# Patient Record
Sex: Male | Born: 1965 | Race: White | Hispanic: No | Smoking: Current every day smoker
Health system: Southern US, Community
[De-identification: ages and names within clinical notes are randomized; demographics above are authoritative.]

## PROBLEM LIST (undated history)

## (undated) DIAGNOSIS — K589 Irritable bowel syndrome without diarrhea: Secondary | ICD-10-CM

## (undated) DIAGNOSIS — R1013 Epigastric pain: Secondary | ICD-10-CM

## (undated) DIAGNOSIS — E785 Hyperlipidemia, unspecified: Secondary | ICD-10-CM

## (undated) DIAGNOSIS — I10 Essential (primary) hypertension: Secondary | ICD-10-CM

## (undated) DIAGNOSIS — R569 Unspecified convulsions: Secondary | ICD-10-CM

## (undated) DIAGNOSIS — F10939 Alcohol use, unspecified with withdrawal, unspecified: Principal | ICD-10-CM

## (undated) DIAGNOSIS — E876 Hypokalemia: Secondary | ICD-10-CM

## (undated) DIAGNOSIS — F10239 Alcohol dependence with withdrawal, unspecified: Principal | ICD-10-CM

## (undated) DIAGNOSIS — R0602 Shortness of breath: Secondary | ICD-10-CM

## (undated) DIAGNOSIS — G629 Polyneuropathy, unspecified: Secondary | ICD-10-CM

## (undated) DIAGNOSIS — F419 Anxiety disorder, unspecified: Secondary | ICD-10-CM

## (undated) DIAGNOSIS — F19239 Other psychoactive substance dependence with withdrawal, unspecified: Secondary | ICD-10-CM

## (undated) DIAGNOSIS — I4581 Long QT syndrome: Secondary | ICD-10-CM

## (undated) DIAGNOSIS — K759 Inflammatory liver disease, unspecified: Secondary | ICD-10-CM

## (undated) DIAGNOSIS — N400 Enlarged prostate without lower urinary tract symptoms: Secondary | ICD-10-CM

## (undated) DIAGNOSIS — F1910 Other psychoactive substance abuse, uncomplicated: Secondary | ICD-10-CM

## (undated) DIAGNOSIS — F99 Mental disorder, not otherwise specified: Secondary | ICD-10-CM

## (undated) DIAGNOSIS — J449 Chronic obstructive pulmonary disease, unspecified: Secondary | ICD-10-CM

## (undated) DIAGNOSIS — I499 Cardiac arrhythmia, unspecified: Secondary | ICD-10-CM

## (undated) DIAGNOSIS — T7840XA Allergy, unspecified, initial encounter: Secondary | ICD-10-CM

## (undated) DIAGNOSIS — F1010 Alcohol abuse, uncomplicated: Secondary | ICD-10-CM

## (undated) DIAGNOSIS — K219 Gastro-esophageal reflux disease without esophagitis: Secondary | ICD-10-CM

## (undated) DIAGNOSIS — R51 Headache: Secondary | ICD-10-CM

## (undated) DIAGNOSIS — G709 Myoneural disorder, unspecified: Secondary | ICD-10-CM

## (undated) HISTORY — PX: SHOULDER SURGERY: SHX246

## (undated) HISTORY — DX: Benign prostatic hyperplasia without lower urinary tract symptoms: N40.0

## (undated) HISTORY — PX: BACK SURGERY: SHX140

## (undated) HISTORY — PX: NISSEN FUNDOPLICATION: SHX2091

## (undated) HISTORY — DX: Essential (primary) hypertension: I10

## (undated) HISTORY — DX: Irritable bowel syndrome, unspecified: K58.9

## (undated) HISTORY — DX: Epigastric pain: R10.13

## (undated) HISTORY — DX: Hyperlipidemia, unspecified: E78.5

## (undated) HISTORY — PX: NASAL SINUS SURGERY: SHX719

## (undated) HISTORY — DX: Hypokalemia: E87.6

## (undated) HISTORY — DX: Myoneural disorder, unspecified: G70.9

## (undated) HISTORY — DX: Allergy, unspecified, initial encounter: T78.40XA

## (undated) HISTORY — PX: UPPER GASTROINTESTINAL ENDOSCOPY: SHX188

---

## 1996-03-11 HISTORY — PX: COLONOSCOPY: SHX174

## 1997-10-21 ENCOUNTER — Ambulatory Visit (HOSPITAL_BASED_OUTPATIENT_CLINIC_OR_DEPARTMENT_OTHER): Admission: RE | Admit: 1997-10-21 | Discharge: 1997-10-21 | Payer: Self-pay | Admitting: *Deleted

## 1998-03-11 HISTORY — PX: NECK SURGERY: SHX720

## 1998-10-18 ENCOUNTER — Encounter: Payer: Self-pay | Admitting: Neurosurgery

## 1998-10-18 ENCOUNTER — Ambulatory Visit (HOSPITAL_COMMUNITY): Admission: RE | Admit: 1998-10-18 | Discharge: 1998-10-18 | Payer: Self-pay | Admitting: Neurosurgery

## 1999-01-11 ENCOUNTER — Ambulatory Visit (HOSPITAL_COMMUNITY): Admission: RE | Admit: 1999-01-11 | Discharge: 1999-01-11 | Payer: Self-pay | Admitting: Neurosurgery

## 1999-01-11 ENCOUNTER — Encounter: Payer: Self-pay | Admitting: Neurosurgery

## 1999-02-09 ENCOUNTER — Encounter: Payer: Self-pay | Admitting: Neurosurgery

## 1999-02-13 ENCOUNTER — Encounter: Payer: Self-pay | Admitting: Neurosurgery

## 1999-02-13 ENCOUNTER — Inpatient Hospital Stay (HOSPITAL_COMMUNITY): Admission: RE | Admit: 1999-02-13 | Discharge: 1999-02-16 | Payer: Self-pay | Admitting: Neurosurgery

## 1999-02-26 ENCOUNTER — Encounter: Payer: Self-pay | Admitting: Neurosurgery

## 1999-02-26 ENCOUNTER — Encounter: Admission: RE | Admit: 1999-02-26 | Discharge: 1999-02-26 | Payer: Self-pay | Admitting: Neurosurgery

## 1999-05-20 ENCOUNTER — Encounter: Payer: Self-pay | Admitting: Neurosurgery

## 1999-05-20 ENCOUNTER — Ambulatory Visit (HOSPITAL_COMMUNITY): Admission: RE | Admit: 1999-05-20 | Discharge: 1999-05-20 | Payer: Self-pay | Admitting: Neurosurgery

## 1999-06-19 ENCOUNTER — Inpatient Hospital Stay (HOSPITAL_COMMUNITY): Admission: RE | Admit: 1999-06-19 | Discharge: 1999-06-20 | Payer: Self-pay | Admitting: Neurosurgery

## 1999-06-19 ENCOUNTER — Encounter: Payer: Self-pay | Admitting: Neurosurgery

## 1999-07-03 ENCOUNTER — Encounter: Payer: Self-pay | Admitting: Neurosurgery

## 1999-07-03 ENCOUNTER — Encounter: Admission: RE | Admit: 1999-07-03 | Discharge: 1999-07-03 | Payer: Self-pay | Admitting: Neurosurgery

## 2000-09-26 ENCOUNTER — Encounter: Admission: RE | Admit: 2000-09-26 | Discharge: 2000-09-26 | Payer: Self-pay | Admitting: Neurosurgery

## 2000-09-26 ENCOUNTER — Encounter: Payer: Self-pay | Admitting: Neurosurgery

## 2000-12-25 ENCOUNTER — Encounter: Payer: Self-pay | Admitting: Neurosurgery

## 2000-12-25 ENCOUNTER — Ambulatory Visit (HOSPITAL_COMMUNITY): Admission: RE | Admit: 2000-12-25 | Discharge: 2000-12-25 | Payer: Self-pay | Admitting: Neurosurgery

## 2001-02-21 ENCOUNTER — Encounter: Payer: Self-pay | Admitting: Neurosurgery

## 2001-02-21 ENCOUNTER — Ambulatory Visit (HOSPITAL_COMMUNITY): Admission: RE | Admit: 2001-02-21 | Discharge: 2001-02-21 | Payer: Self-pay | Admitting: Neurosurgery

## 2001-04-03 ENCOUNTER — Encounter: Payer: Self-pay | Admitting: Neurosurgery

## 2001-04-03 ENCOUNTER — Encounter: Admission: RE | Admit: 2001-04-03 | Discharge: 2001-04-03 | Payer: Self-pay | Admitting: Neurosurgery

## 2001-05-01 ENCOUNTER — Encounter: Payer: Self-pay | Admitting: Neurosurgery

## 2001-05-01 ENCOUNTER — Encounter: Admission: RE | Admit: 2001-05-01 | Discharge: 2001-05-01 | Payer: Self-pay | Admitting: Neurosurgery

## 2001-05-15 ENCOUNTER — Encounter: Admission: RE | Admit: 2001-05-15 | Discharge: 2001-05-15 | Payer: Self-pay | Admitting: Neurosurgery

## 2001-05-15 ENCOUNTER — Encounter: Payer: Self-pay | Admitting: Neurosurgery

## 2001-06-18 ENCOUNTER — Encounter: Payer: Self-pay | Admitting: Gastroenterology

## 2001-06-18 ENCOUNTER — Encounter: Admission: RE | Admit: 2001-06-18 | Discharge: 2001-06-18 | Payer: Self-pay | Admitting: Gastroenterology

## 2001-06-19 ENCOUNTER — Encounter: Admission: RE | Admit: 2001-06-19 | Discharge: 2001-06-19 | Payer: Self-pay | Admitting: Gastroenterology

## 2001-06-19 ENCOUNTER — Encounter: Payer: Self-pay | Admitting: Gastroenterology

## 2001-07-01 ENCOUNTER — Encounter: Payer: Self-pay | Admitting: Gastroenterology

## 2001-07-01 ENCOUNTER — Ambulatory Visit (HOSPITAL_COMMUNITY): Admission: RE | Admit: 2001-07-01 | Discharge: 2001-07-01 | Payer: Self-pay | Admitting: Gastroenterology

## 2001-08-18 ENCOUNTER — Ambulatory Visit (HOSPITAL_COMMUNITY): Admission: RE | Admit: 2001-08-18 | Discharge: 2001-08-18 | Payer: Self-pay | Admitting: Gastroenterology

## 2001-09-29 ENCOUNTER — Ambulatory Visit (HOSPITAL_COMMUNITY): Admission: RE | Admit: 2001-09-29 | Discharge: 2001-09-29 | Payer: Self-pay | Admitting: Surgery

## 2001-09-29 ENCOUNTER — Encounter: Payer: Self-pay | Admitting: Surgery

## 2001-10-20 ENCOUNTER — Encounter: Payer: Self-pay | Admitting: Surgery

## 2001-10-26 ENCOUNTER — Inpatient Hospital Stay (HOSPITAL_COMMUNITY): Admission: RE | Admit: 2001-10-26 | Discharge: 2001-10-28 | Payer: Self-pay | Admitting: Surgery

## 2005-01-18 ENCOUNTER — Ambulatory Visit (HOSPITAL_COMMUNITY): Admission: RE | Admit: 2005-01-18 | Discharge: 2005-01-18 | Payer: Self-pay | Admitting: Surgery

## 2005-01-23 ENCOUNTER — Ambulatory Visit (HOSPITAL_COMMUNITY): Admission: RE | Admit: 2005-01-23 | Discharge: 2005-01-23 | Payer: Self-pay | Admitting: Surgery

## 2007-04-27 ENCOUNTER — Encounter: Admission: RE | Admit: 2007-04-27 | Discharge: 2007-04-27 | Payer: Self-pay | Admitting: Family Medicine

## 2010-03-31 ENCOUNTER — Encounter: Payer: Self-pay | Admitting: Surgery

## 2010-06-19 ENCOUNTER — Ambulatory Visit (INDEPENDENT_AMBULATORY_CARE_PROVIDER_SITE_OTHER)
Admission: RE | Admit: 2010-06-19 | Discharge: 2010-06-19 | Disposition: A | Discharge status: Home / Self Care | Payer: Managed Care, Other (non HMO) | Source: Ambulatory Visit | Attending: Internal Medicine | Admitting: Internal Medicine

## 2010-06-19 ENCOUNTER — Encounter: Payer: Self-pay | Admitting: Internal Medicine

## 2010-06-19 ENCOUNTER — Institutional Professional Consult (permissible substitution): Payer: Self-pay | Admitting: Internal Medicine

## 2010-06-19 ENCOUNTER — Ambulatory Visit (INDEPENDENT_AMBULATORY_CARE_PROVIDER_SITE_OTHER): Payer: Managed Care, Other (non HMO) | Admitting: Internal Medicine

## 2010-06-19 DIAGNOSIS — I10 Essential (primary) hypertension: Secondary | ICD-10-CM

## 2010-06-19 DIAGNOSIS — R0602 Shortness of breath: Secondary | ICD-10-CM

## 2010-06-19 DIAGNOSIS — E785 Hyperlipidemia, unspecified: Secondary | ICD-10-CM

## 2010-06-19 DIAGNOSIS — R079 Chest pain, unspecified: Secondary | ICD-10-CM

## 2010-06-19 DIAGNOSIS — Z77120 Contact with and (suspected) exposure to mold (toxic): Secondary | ICD-10-CM | POA: Insufficient documentation

## 2010-06-19 DIAGNOSIS — R0789 Other chest pain: Secondary | ICD-10-CM | POA: Insufficient documentation

## 2010-06-19 MED ORDER — TRAMADOL HCL 50 MG PO TABS: ORAL_TABLET | ORAL | Status: DC

## 2010-06-19 MED ORDER — FAMOTIDINE 20 MG PO TABS: ORAL_TABLET | ORAL | Status: DC

## 2010-06-19 MED ORDER — ESOMEPRAZOLE MAGNESIUM 40 MG PO CPDR: DELAYED_RELEASE_CAPSULE | ORAL | Status: DC

## 2010-06-19 NOTE — Assessment & Plan Note (Signed)
The differential diagnosis of difficult to control airways disorders is extensive with no quick and easy answers but easy to remember because it consists of 12 A's,  Two Bs and one C: 1. Adherence, always a challenge and the leading suspect but I don't believe is an issue here  2. Acid reflux disease, with the greater proportion of pulmonary patients with no overt heartburn symptoms, and no easy way to treat non-acid reflux > max gerd rx and diet reviewed. 3. Ace inhibitor use, the side effects of which give  even experienced pulmonologists a challenge sorting out (it's not all about the dry cough, though if cough is present ace's will need to be stopped for at least a month to tease out this component) Last dose 07/01/10 4. Active sinus dz, best addressed by a sinus ct 5. Active smoking,  Discussed commit to quit 6. Allergic diseases > Clearly needs to avoid mold 7. Aspiration, a perennial problem in the elderly or other patients at risk 8. Allergic Bronchopulmonary Aspergillosis, associated with IgE's in the thousands 9. Alpha one Antitrypsin deficiency, a must screen in patients with chronic airflow obstruction syndromes out of proportion to smoking history. 10. Adverse effect of inhalers, especially DPI's and especially with poor inhaler technique 11 Anxiety, always a diagnosis of exclusion 12 Acquaintance of or Actual healthcare provider ( which can be very challenging) Mother is a nurse Two B's Bronchiectasis and Beta blocker effects >not relevant here  One C Congestive heart failure, nicely ruled out now with BNP level of < 100 when symptomatic  >not relevant here   For now rx max gerd, keep off ace and suppress urge to clear throat to see if it will clear.

## 2010-06-19 NOTE — Progress Notes (Signed)
  Subjective:    Patient ID: Mark [name], male    DOB: Mar 17, 1965, 45 y.o.   MRN: [id number]  HPI  50 yowm smoker with no previous hx of resp complaints referred by Dr Foy Guadalajara for eval of new onset cp/cough/sob p ? Mold exp on job 05/02/10    06/19/2010 cc chest tightness started same day (Feb 22nd 2012) as exposed to pipes in old appt cleaning up after replaced a valve and smelled moldy and dusty environment while wearing dust mask then dev severe cough to point of heaving s/p NF assoc with voice loss, sob while on lisnopril and stopped working at that point and then still  ended up at [facility] 2 days later ER 2/25 with nl ct chest  dx with hbp and rec albuterol but no change in lisinopril which eventually was stopped by Dr Foy Guadalajara on 05/31/10 but symptoms worse since Friday 6th of March with tremors and tight throat, dysphagia, sob despite nexium started 3/5. Also rx with sporanox   Cough presently non-stop dry sore throat with constant urge for clearing 24 h per day. No active sinus c/os.  Pt denies any significant   itching, sneezing,  nasal congestion or excess/ purulent secretions,  fever, chills, sweats, unintended wt loss, pleuritic or exertional cp, hempoptysis, orthopnea pnd or leg swelling.    Also denies any obvious fluctuation of symptoms with weather or environmental changes or other aggravating or alleviating factors.       PMHx S/p NF  2003  Smoker       Review of Systems  Constitutional: Positive for activity change and unexpected weight change. Negative for fever, chills and appetite change.  HENT: Positive for congestion, sore throat and trouble swallowing. Negative for rhinorrhea, sneezing, dental problem, voice change and postnasal drip.   Eyes: Negative for visual disturbance.  Respiratory: Positive for cough and shortness of breath. Negative for choking.   Cardiovascular: Positive for chest pain. Negative for leg swelling.  Gastrointestinal: Positive for nausea and  abdominal pain. Negative for vomiting.  Genitourinary: Negative for difficulty urinating.  Musculoskeletal: Positive for arthralgias.  Skin: Negative for rash.  Psychiatric/Behavioral: Negative for behavioral problems and confusion.       Objective:   Physical Exam Anxious wm nad   Wt = 205 lb freq thoat clearing and minimal pseudowheeze  HEENT: nl dentition, turbinates, and orophanx. Nl external ear canals without cough reflex   NECK :  without JVD/Nodes/TM/ nl carotid upstrokes bilaterally   LUNGS: no acc muscle use, clear to A and P bilaterally without cough on insp or exp maneuvers   CV:  RRR  no s3 or murmur or increase in P2, no edema   ABD:  soft and nontender with nl excursion in the supine position. No bruits or organomegaly, bowel sounds nl  MS:  warm without deformities, calf tenderness, cyanosis or clubbing  SKIN: warm and dry without lesions    NEURO:  alert, approp, no deficits        cxr 05/17/2010 ok Assessment & Plan:

## 2010-06-19 NOTE — Patient Instructions (Signed)
Increase Nexium to Take 30- 60 min before your first and last meals of the day and Pepcid 20 mg one at bedtime until no cough at all  GERD (REFLUX)  is an extremely common cause of respiratory symptoms, many times with no significant heartburn at all.    It can be treated with medication, but also with lifestyle changes including avoidance of late meals, excessive alcohol, smoking cessation, and avoid fatty foods, chocolate, peppermint, colas, red wine, and acidic juices such as orange juice.  NO MINT OR MENTHOL PRODUCTS SO NO COUGH DROPS  USE SUGARLESS CANDY INSTEAD (jolley ranchers or Stover's)  NO OIL BASED VITAMINS   Take delsym two tsp every 12 hours and supplement if needed with  tramadol 50 mg up to 2 every 4 hours to suppress the urge to cough. Swallowing water or using ice chips/non mint and menthol containing candies (such as lifesavers or sugarless jolly ranchers) are also effective.  You should rest your voice and avoid activities that you know make you cough.  Once you have eliminated the cough for 3 straight days try reducing the tramadol first,  then the delsym as tolerated.    You will not be completely free from the effects of the lisinopril until around the first of May but need to return here if not 100% by then

## 2010-06-25 ENCOUNTER — Inpatient Hospital Stay (HOSPITAL_COMMUNITY)
Admission: AD | Admit: 2010-06-25 | Discharge: 2010-06-30 | DRG: 897 | Disposition: A | Discharge status: Home / Self Care | Payer: Managed Care, Other (non HMO) | Source: Ambulatory Visit | Attending: Internal Medicine | Admitting: Internal Medicine

## 2010-06-25 ENCOUNTER — Telehealth: Payer: Self-pay | Admitting: Internal Medicine

## 2010-06-25 DIAGNOSIS — R05 Cough: Secondary | ICD-10-CM | POA: Diagnosis present

## 2010-06-25 DIAGNOSIS — M545 Low back pain, unspecified: Secondary | ICD-10-CM | POA: Diagnosis present

## 2010-06-25 DIAGNOSIS — I10 Essential (primary) hypertension: Secondary | ICD-10-CM | POA: Diagnosis present

## 2010-06-25 DIAGNOSIS — K219 Gastro-esophageal reflux disease without esophagitis: Secondary | ICD-10-CM | POA: Diagnosis present

## 2010-06-25 DIAGNOSIS — F10239 Alcohol dependence with withdrawal, unspecified: Principal | ICD-10-CM | POA: Diagnosis present

## 2010-06-25 DIAGNOSIS — F1020 Alcohol dependence, uncomplicated: Secondary | ICD-10-CM | POA: Diagnosis present

## 2010-06-25 DIAGNOSIS — R000 Tachycardia, unspecified: Secondary | ICD-10-CM | POA: Diagnosis present

## 2010-06-25 DIAGNOSIS — G252 Other specified forms of tremor: Secondary | ICD-10-CM | POA: Diagnosis present

## 2010-06-25 DIAGNOSIS — F411 Generalized anxiety disorder: Secondary | ICD-10-CM | POA: Diagnosis not present

## 2010-06-25 DIAGNOSIS — R059 Cough, unspecified: Secondary | ICD-10-CM | POA: Diagnosis present

## 2010-06-25 DIAGNOSIS — F10939 Alcohol use, unspecified with withdrawal, unspecified: Principal | ICD-10-CM | POA: Diagnosis present

## 2010-06-25 DIAGNOSIS — Z77120 Contact with and (suspected) exposure to mold (toxic): Secondary | ICD-10-CM | POA: Diagnosis present

## 2010-06-25 DIAGNOSIS — G250 Essential tremor: Secondary | ICD-10-CM | POA: Diagnosis present

## 2010-06-25 DIAGNOSIS — F17200 Nicotine dependence, unspecified, uncomplicated: Secondary | ICD-10-CM | POA: Diagnosis present

## 2010-06-25 DIAGNOSIS — E785 Hyperlipidemia, unspecified: Secondary | ICD-10-CM | POA: Diagnosis present

## 2010-06-25 DIAGNOSIS — G8929 Other chronic pain: Secondary | ICD-10-CM | POA: Diagnosis present

## 2010-06-25 DIAGNOSIS — M542 Cervicalgia: Secondary | ICD-10-CM | POA: Diagnosis present

## 2010-06-25 LAB — CBC
HCT: 42.3 % (ref 39.0–52.0)
Hemoglobin: 15 g/dL (ref 13.0–17.0)
WBC: 6.9 10*3/uL (ref 4.0–10.5)

## 2010-06-25 LAB — COMPREHENSIVE METABOLIC PANEL
ALT: 48 U/L (ref 0–53)
AST: 82 U/L — ABNORMAL HIGH (ref 0–37)
CO2: 27 mEq/L (ref 19–32)
Chloride: 98 mEq/L (ref 96–112)
GFR calc Af Amer: >60 mL/min (ref >60)
GFR calc non Af Amer: >60 mL/min (ref >60)
Sodium: 139 mEq/L (ref 135–145)
Total Bilirubin: 0.3 mg/dL (ref 0.3–1.2)

## 2010-06-25 LAB — DIFFERENTIAL
Basophils Absolute: 0 10*3/uL (ref 0.0–0.1)
Lymphocytes Relative: 20 % (ref 12–46)
Monocytes Absolute: 0.4 10*3/uL (ref 0.1–1.0)
Neutro Abs: 5 10*3/uL (ref 1.7–7.7)

## 2010-06-25 NOTE — Telephone Encounter (Signed)
Will forward to MW so he is aware. Please advise thanks

## 2010-06-26 ENCOUNTER — Ambulatory Visit: Payer: Managed Care, Other (non HMO) | Admitting: Internal Medicine

## 2010-06-26 DIAGNOSIS — R05 Cough: Secondary | ICD-10-CM

## 2010-06-26 DIAGNOSIS — R0602 Shortness of breath: Secondary | ICD-10-CM

## 2010-06-26 LAB — RAPID URINE DRUG SCREEN, HOSP PERFORMED
Amphetamines: NOT DETECTED
Barbiturates: NOT DETECTED
Benzodiazepines: POSITIVE — AB
Cocaine: NOT DETECTED
Opiates: NOT DETECTED
Tetrahydrocannabinol: NOT DETECTED

## 2010-06-26 NOTE — Telephone Encounter (Signed)
Aware, see consult note

## 2010-06-27 LAB — MAGNESIUM: Magnesium: 2.2 mg/dL (ref 1.5–2.5)

## 2010-06-28 ENCOUNTER — Telehealth: Payer: Self-pay | Admitting: Internal Medicine

## 2010-06-28 LAB — FOLATE RBC: RBC Folate: 431 ng/mL (ref >=366)

## 2010-06-28 LAB — RPR: RPR Ser Ql: NONREACTIVE

## 2010-06-28 LAB — VITAMIN B12: Vitamin B-12: 615 pg/mL (ref 211–911)

## 2010-06-28 LAB — COMPREHENSIVE METABOLIC PANEL
Albumin: 3.6 g/dL (ref 3.5–5.2)
BUN: 6 mg/dL (ref 6–23)
Chloride: 101 mEq/L (ref 96–112)
Creatinine, Ser: 0.96 mg/dL (ref 0.4–1.5)
Total Bilirubin: 0.7 mg/dL (ref 0.3–1.2)
Total Protein: 6.5 g/dL (ref 6.0–8.3)

## 2010-06-28 NOTE — Telephone Encounter (Signed)
Pt's mother is requesting to speak with MW regarding "mold issue". She states that she has been told thee is a blood test for this and wants to discuss having this test done on her son. She says the hospital has told her this would have to be ordered by Dr. Sherene Sires. She feels the nurses and doctors now caring for her son are not answering the questions she has and they are wanting to release him possibly on Fri., 4/20. Pls advise.

## 2010-06-28 NOTE — Telephone Encounter (Signed)
Pt's mother aware but would still like to speak with MW directly and has several questions. She also stated that she was told to ask for a blood test to be ran that is called BGFE. Mother can be reached at [phone].

## 2010-06-28 NOTE — Telephone Encounter (Signed)
Discussed - no further w/u for mold needed from my perspective at this point

## 2010-06-28 NOTE — Telephone Encounter (Signed)
I do not know of any test that can establish for sure what role mold is playing in his illness  -  We have an allergist in our office I can refer them to after we have him stabilized but the main treatment is avoidance of further mold

## 2010-06-29 DIAGNOSIS — F1020 Alcohol dependence, uncomplicated: Secondary | ICD-10-CM

## 2010-06-29 NOTE — Progress Notes (Signed)
Quick Note:  Spoke with pt and notified of results per Dr. Wert. Pt verbalized understanding and denied any questions.  ______ 

## 2010-06-30 LAB — BASIC METABOLIC PANEL
CO2: 28 mEq/L (ref 19–32)
Calcium: 9.4 mg/dL (ref 8.4–10.5)
GFR calc Af Amer: >60 mL/min (ref >60)
GFR calc non Af Amer: >60 mL/min (ref >60)
Sodium: 138 mEq/L (ref 135–145)

## 2010-07-01 NOTE — Consult Note (Signed)
NAME:  [name], [name]               ACCOUNT NO.:  [id number]  MEDICAL RECORD NO.:  [id number]          PATIENT TYPE:  LOCATION:                                 FACILITY:  PHYSICIAN:  Casimiro Needle B. Sherene Sires, MD, FCCP   DATE OF BIRTH:  DATE OF CONSULTATION: DATE OF DISCHARGE:                                CONSULTATION   REFERRING PHYSICIAN:  Hillery Aldo, M.D.  PRIMARY CARE PHYSICIAN:  Robert L. Foy Guadalajara, M.D.  REASON FOR CONSULTATION:  Refractory cough.  Date of consultation:  06/26/10  HISTORY:  This is an exceptionally complicated 45 year old white male, active smoker, who was seen in the office on April 10th with a history of an acute exposure to mold on February 22nd "never right since" with severe cough to the point of vomiting associated with hoarseness, voice loss, and refractory persistent upper airway pattern coughing.  He had been on ACE inhibitors during part of this time and had been seen at [facility] with essentially negative workup (including CT Chest) except for hypertension and ultimately ACE inhibitors were stopped on March 22nd by Dr Foy Guadalajara, but the patient continues today to have severe cough with sore throat, sensation of dysphagia, and shortness of breath, and therefore I saw him in the office.  I was most impressed by his hoarseness and upper airway pattern cough with a clear lung exam.    My diagnosis was upper airway cough syndrome, exacerbated by ACE inhibitors and probably with secondary reflux, that is he was coughing so hard, he was vomiting, so certainly he was refluxing despite the fact that he is status post Nissen fundoplication.  By history, it appeared that he was allergic to mold, but I could not independently verify this and note that his symptoms  persisted after no known further exposure. Dr. Foy Guadalajara expressed the concern that he was continuing to be exposed to mold chronically, although that is not the history that I obtained directly from the  patient, so there was some question of whether there was ongoing mold exposure or not, but certainly many of the symptoms that are being attribute to mold have continued while in the hospital and that is one of the reasons I'm being asked to see him again.  My originalrecommendations were to initiate high-dose PPI therapy, control the cough with Delsym supplement with tramadol.  The patient states that in addition to my recommendations, he added alcohol to try to control his symptoms otherwise he felt he  was "shaking all the time" with some question of myoclonic jerking and therefore admitted for observations and for evaluation on April 16th and I was asked to see him on April 17th.  When I entered the room, the patient was clearing his throat, but denied actually having any cough at all.  He also states that he is breathing with some "better."  There is no active sinus complaints nor any overt reflux symptoms at present, purulent sputum, fever, chills, sweats, or pleuritic chest pain.  PAST MEDICAL HISTORY:  Significant only for hypertension.  MEDICATIONS:  Please see med rec sheet, but note I am not confident that the patient  is taking the medications as they are prescribed.  (He is status post Nissen complication in 2003).  SOCIAL HISTORY:  Significant that he has been an active smoker.  FAMILY HISTORY:  Negative for respiratory disease.  REVIEW OF SYSTEMS:  All systems are negative except as outlined above.  PHYSICAL EXAMINATION:  GENERAL:  This is a relatively healthy-appearing white male who does clear his throat frequently during the exam and does display hoarseness and voice fatigue.  However, when he does cough, there is minimal dry hacking present. HEENT:  Unremarkable. NECK:  Supple without cervical adenopathy or tenderness.  Trachea is midline. LUNGS:  Lung fields are perfectly clear to bilateral auscultation and percussion. HEART:  Regular rhythm without murmur,  rub, or gallop. ABDOMEN:  Soft and benign. EXTREMITIES:  Warm without calf tenderness, cyanosis, clubbing, or edema.  He did not have a chest x-ray on admission, but His last chest x-ray on April 10th was normal and he was told a CT scan of chest was normal at [facility].  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 The fact that the patient perceives that he is no longer coughing and he had has frequent throat clearing and hoarseness demonstrate poor insight by the patient.  I strongly recommended he had adhere to the recommendations what I made to maintain high-dose PPI therapy, suppress the cough with tramadol (there is some question whether tramadol will help here since he is chronically on methadone) and minimize the use of albuterol at this point since this may actually exacerbate upper airway coughing.  The general rule of some is that one waits a full 6 weeks off ACE inhibitors before considering additional workup for this pattern cough unless there is a compelling reason to pursue it further.  I am not able to shed any further light on whether he might be allergic to mold as part of the problem.  Certainly, allergy profile along with IgE would be appropriate to obtain as an outpatient for screening purposes and referral to allergy if needed, although I do not believe that is necessary here.  In fact, many times in older adults, I find that although allergies may "trigger" the problem they are not typically fueling refractory symptoms in large part because we have such effective medication to offset allergic mechanisms causing cough and asthma.     Charlaine Dalton. Sherene Sires, MD, [facility]     MBW/MEDQ  D:  06/26/2010  T:  06/27/2010  Job:  [id number]  cc:   Molly Maduro L. Foy Guadalajara, M.D. Fax: [phone]  Electronically Signed by Sandrea Hughs MD FCCP on 07/01/2010 12:57:05 PM

## 2010-07-02 ENCOUNTER — Institutional Professional Consult (permissible substitution): Payer: Self-pay | Admitting: Internal Medicine

## 2010-07-03 NOTE — H&P (Signed)
NAMEEdder, Mark [name]               ACCOUNT NO.:  [id number]  MEDICAL RECORD NO.:  [id number]          PATIENT TYPE:  LOCATION:                                 FACILITY:  PHYSICIAN:  Hillery Aldo, M.D.   DATE OF BIRTH:  February 22, 1966  DATE OF ADMISSION:  06/25/2010 DATE OF DISCHARGE:                             HISTORY & PHYSICAL   PRIMARY CARE PHYSICIAN:  Robert L. Foy Guadalajara, M.D.  PULMONOLOGIST:  Charlaine Dalton. Sherene Sires, M.D., [facility]  CHIEF COMPLAINT:  Shaking spells.  HISTORY OF PRESENT ILLNESS:  The patient is a 45 year old male whose history of present illness dates back to February when he had a significant mold and dust from drywall exposure.  He developed significant respiratory symptoms including chest tightness, coughing, and a generalized feeling of being unwell.  He was evaluated by Dr. Foy Guadalajara for this and ultimately put on Sporanox for his mold exposure.  He also saw Dr. Sherene Sires.  He was given a trial of prednisone, but this was discontinued when it caused significant elevations of his blood pressure.  He also was given albuterol, which he says made his tremors worse and another inhaler, which he said did not help.  The patient reports that he has had tremulousness and shaking spells since being on the Sporanox.  He describes these shaking spells as being tremors that are uncontrollable and occasional muscle spasms that caused him to jerk his muscles violently.  He states that these tremors typically lasts 15- 20 minutes at a time and occur several times a day, worse in the morning and in the evening.  He also states that he has been increasing his consumption of alcohol because alcohol does seem to less than his symptoms.  Currently drinking a fifth of liquor daily to control his symptoms.  The patient called his primary care physician with these complaints and his primary care physician requested direct admission for further evaluation.  PAST MEDICAL HISTORY: 1. 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 2. Gastroesophageal reflux disease, status post Nissen fundoplication. 3. History of dyslipidemia. 4. History of erectile dysfunction. 5. Hypertension. 6. Status post neck and back fusion surgeries.  FAMILY HISTORY:  The patient's mother is alive with hypertension.  The patient's father died at 49 from lung cancer.  He has 1 healthy sister and 2 healthy daughters.  SOCIAL HISTORY:  The patient is married, but currently separated.  He lives alone.  He smokes about a pack of tobacco daily and is drinking up to 20 ounces of liquor a day.  Denies any drug use.  He works as a Lexicographer.  ALLERGIES:  No known drug allergies.  CURRENT MEDICATIONS: 1. Visine 2 drops b.i.d. p.r.n. burning eyes. 2. Aleve 220 mg 2 tablets p.o. b.i.d. p.r.n. 3. WelChol 625 mg 2 tablets p.o. t.i.d. 4. Tramadol 50 mg 2 tablets q.4 h. p.r.n. 5. Losartan 50 mg p.o. daily. 6. Pepcid 20 mg p.o. nightly. 7. Nexium 40 mg p.o. b.i.d. 8. Methadone 10 mg 2 tablets p.o. daily.REVIEW OF SYSTEMS:  CONSTITUTIONAL:  Positive for intermittent fever and  chills, fever is low grade.  Positive for diminished appetite, but reports a weight gain over time, no weight loss.  HEENT:  Positive for sore throat with burning eyes and sinuses.  CARDIOVASCULAR:  Occasional chest pain and palpitations.  RESPIRATORY:  Positive for dyspnea, which worsens with activity and dry cough.  GI:  Positive for nausea with occasional vomiting.  No hematemesis.  Positive for constipation, but no melena or hematochezia.  GU:  No dysuria or hematuria.  A comprehensive 14-point review of systems is otherwise as noted in the elements of the HPI above.  PHYSICAL EXAMINATION:  VITAL SIGNS:  Temperature 99, pulse 108, respirations 20, blood pressure 160/101. GENERAL:  A well-developed, well-nourished Caucasian male in no acute distress. HEENT:  Normocephalic, atraumatic.  PERRL.   EOMI.  Oropharynx is clear. NECK:  Supple, no thyromegaly, no lymphadenopathy, no jugular venous distention. CHEST:  Lungs clear to auscultation bilaterally with good air movement. No wheezes. HEART:  Tachycardic rate, regular rhythm.  No murmurs, rubs, or gallops. ABDOMEN:  Soft, nontender, nondistended with normoactive bowel sounds. EXTREMITIES:  No clubbing, edema, or cyanosis. SKIN:  Warm, dry.  No rashes. NEUROLOGIC:  Alert and oriented x3.  Cranial nerves II through XII grossly intact.  Moves all extremities x4 with equal strength.  No tremulousness noted currently.  DATA REVIEW:  The patient is a direct admission and therefore we do not have any data at this time.  ASSESSMENT/PLAN: 1. Alcohol dependency with tremulousness concerning for alcohol     withdrawal symptoms:  We will admit the patient and detox him with     Ativan per the CIWA protocol.  If he continues to have tremors and     tremulousness, would consider a neurological consultation.  He may     have essential tremor or another process, although the acuteness in     onset corresponding with Sporanox administration is not consistent     with this.  We will check a urine drug screen to rule out other     illicit substance use.  We will also check a TSH to make sure he     does not have any evidence of hyperthyroidism. 2. Hypertension:  The patient's blood pressure is elevated.  We will     continue his losartan and start him on clonidine to help with     withdrawal symptoms. 3. Tachycardia:  Likely due again to withdrawal symptoms.  We will     start him on metoprolol, which may help his tremors as well. 4. Chronic pain:  Continue the patient's methadone and Ultram. 5. Gastroesophageal reflux disease:  Continue the patient's proton     pump inhibitor therapy as well as Pepcid. 6. Dyslipidemia:  Continue the patient's WelChol. 7. Tobacco abuse:  We will counsel cessation and provide a nicotine     patch if  needed. 8. Prophylaxis:  We use  PAS hoses for deep venous thrombosis     prophylaxis as the patient is ambulatory and at low risk for venous     thromboembolic disease.  Time spent on admission including face-to-face time equals approximately 1 hour.     Hillery Aldo, M.D.     CR/MEDQ  D:  06/25/2010  T:  06/26/2010  Job:  [id number]  cc:   Molly Maduro L. Foy Guadalajara, M.D. Fax: [phone]  Charlaine Dalton. Sherene Sires, MD, FCCP [address] [place] [address]  Electronically Signed by Hillery Aldo M.D. on 07/03/2010 04:04:45 PM

## 2010-07-04 NOTE — Consult Note (Signed)
  NAME:  Mark [name], Mark [name]               ACCOUNT NO.:  [id number]  MEDICAL RECORD NO.:  [id number]           PATIENT TYPE:  LOCATION:                                 FACILITY:  PHYSICIAN:  Eulogio Ditch, MD DATE OF BIRTH:  April 24, 1965  DATE OF CONSULTATION:  06/29/2010 DATE OF DISCHARGE:                                CONSULTATION   REASON FOR CONSULTATION:  Alcohol abuse.  HISTORY OF PRESENT ILLNESS:  A 45 year old white male who was admitted because of the shortness of breath.  The patient was exposed to mold and dust from drywall exposure.  The patient got prednisone and albuterol for treatment and also Sporanox.  The patient also started drinking after separation from the wife.  The patient is married for 20 years, but because he lost his job and has financial stress in the family, he got a temporary separated from the wife and started drinking more before that he told me he was drinking just socially.  The patient drinks a 5th of a liquor daily.  The patient is very logical and goal directed during the interview. Denies suicidal or homicidal ideations, not internally preoccupied.  No psychotic or manic symptoms present.  The patient reported that he has to girls 88 and 45 year old living with the mother.  The patient lives by himself at this time.  The patient has no psych hospitalization in the past.  No history of any rehab.  No history of any legal issues with alcohol drinking.  No DUI. No history of any blackouts or seizures from alcohol withdrawal.  The patient denies using any illicit drugs.  MENTAL STATUS EXAM:  Calm, cooperative during interview.  Fair eye contact, pleasant on approach.  Speech normal in rate, rhythm and volume.  Mood, euthymic affect mood congruent.  Thought process, logical and goal directed.  Thought content, not suicidal or homicidal, not delusional.  Thought perception, no audiovisual hallucination reported, not internally preoccupied.   Cognition, alert, awake, oriented x3. Memory immediate, recent remote fair.  Attention and concentration good. Abstract ability good.  Insight and judgment intact.  DIAGNOSES:  Axis I:  Chronic alcohol dependence. Axis II:  Deferred. Axis III:  See medical notes. Axis IV:  Separation from the wife, chronic alcohol abuse. Axis V:  50 to 60  RECOMMENDATIONS: 1. The patient at this time does not want to go to inpatient rehab. 2. The patient is willing to follow up in the outpatient setting.  The     patient can be provided with information to go for AA meetings.  Thanks for involving me in taking care of this patient.     Eulogio Ditch, MD     SA/MEDQ  D:  06/29/2010  T:  06/29/2010  Job:  [id number]  Electronically Signed by Eulogio Ditch  on 07/04/2010 05:04:25 PM

## 2010-07-23 NOTE — Discharge Summary (Signed)
NAMEMykeal, Mark [name]               ACCOUNT NO.:  [id number]  MEDICAL RECORD NO.:  [id number]          PATIENT TYPE:  LOCATION:                                 FACILITY:  PHYSICIAN:  Kela Millin, M.D.DATE OF BIRTH:  09/12/1965  DATE OF ADMISSION:  06/25/2010 DATE OF DISCHARGE:  06/30/2010                        DISCHARGE SUMMARY - REFERRING   DISCHARGE DIAGNOSES: 1. Chronic alcohol dependence with withdrawal. 2. Probably essential tremor. 3. Probable mold exposure - follow up outpatient with as directed per     environmental services and with PCP. 4. Cough/refractory upper airway cough syndrome - improved - follow up     outpatient with Dr. Sherene Sires. 5. History of chronic low back and neck pain - on chronic methadone. 6. Gastroesophageal reflux disease and status post Nissen     fundoplication in the past. 7. History of dyslipidemia. 8. History of erectile dysfunction. 9. Hypertension. 10.Status post neck and back fusion surgeries in the past.  CONSULTATIONS: 1. Pulmonology - Dr. Sherene Sires. 2. Psychiatry - Dr. Rogers Blocker.  BRIEF HISTORY:  The patient is a 45 year old white male with above- listed medical problems, who presented with shaking spells.  He reported that back in February, he had had a significant mold and dust exposure from drywall.  He developed significant respiratory symptoms including chest tightness, coughing, and just generalized malaise.  He was evaluated by Dr. Foy Guadalajara at that time and started on Sporanox for the mold exposure and he also saw Dr. Sherene Sires.  He was given a trial of prednisone, but this was discontinued because it caused significant elevations in his blood pressure.  He was also treated with albuterol, but he stated that it caused him to have tremors and said it did not help.  He then began having tremulousness and shaking spells since been on the Sporanox.  He described the shaking spells as tremors that were uncontrollable with occasional muscle  spasms that caused him to jerk his muscles violently and that this episodes would last 15-20 minutes at that time and occurred several times a day, worse in the morning and in the evenings.  He stated that his alcohol consumption had been increasing because alcohol seems to help with the tremors that he was having.  He stated that he was up to drinking a sip of liquor daily to control his symptoms.  He saw his primary care physician with this complaints and was directly admitted for further evaluation and management.  HOSPITAL COURSE: 1. Chronic alcohol dependence with withdrawal - upon admission, the     patient was started on Ativan detox protocol.  His tremors have     improved on this withdrawal protocol.  The patient was also started     on multivitamins, thiamine as well as folic acid.  The patient was     counseled to quit alcohol.  Psychiatry was consulted after the     patient had completed his Ativan detox protocol and Dr. Rogers Blocker     saw the patient and discussed possible inpatient rehab with him,     which he declined, but he stated that he is willing to follow up  in     the outpatient setting.  He will be provided alcohol anonymous     information to go to the meetings.  He is to follow up outpatient.     He has been alert and appropriate in his conversations.  The     psychiatrist also indicated from his evaluation that the patient     does have good insight and his judgment is intact and that his     ability to abstract is good.  The patient's family indicated while     he was in the hospital that he was still having some memory lapses     and workup also included vitamin B12 level, which came back within     normal limits.  His RPR is nonreactive and his folic acid level     within normal limits at 431 and ammonia level was also done and was     within normal limits at 27 and his TSH normal at 2.041.  He is to     follow up outpatient with his primary care  physician. 2. Probable essential tremor - the patient had indicated that he was     having some tremor even prior to when he started drinking alcohol.     He was started on beta-blocker - Toprol during this hospital stay     and he is to continue this upon discharge. 3. Probable mold exposure - he had been evaluated by Dr. Foy Guadalajara     outpatient and also saw Dr. Sherene Sires outpatient.  Pulmonology was     consulted during this admission and Dr. Sherene Sires saw the patient as     well and recommended outpatient followup for possible allergy     profile testing if his symptoms were persisting.  Environmental     services was also contacted while the patient was in the hospital     and he has been provided information to contact them and to follow     up outpatient with his primary care physician for further testing. 4. Refractory cough/upper airway cough syndrome - his cough has     improved while in the hospital.  Dr. Sherene Sires was consulted and saw the     patient, and noted that he was no longer coughing, but still had     frequent throat clearing and hoarseness.  Dr. Sherene Sires recommended to     keep the patient on PPI and tramadol to suppress the cough.  He was     also placed on a Delsym during this hospital stay and he is to     continue this upon discharge and follow up outpatient with his PCP     and Dr. Sherene Sires. 5. Hypertension - he was maintained on his outpatient medications,     which he is to continue upon discharge. 6. GERD - he was maintained on a PPI, which he is also to continue     upon discharge.  DISCHARGE MEDICATIONS: 1. Delsym 60 mg p.o. b.i.d. 2. Folic acid 1 mg p.o. daily. 3. Ativan 1 mg t.i.d. p.r.n. 4. Toprol-XL 25 mg p.o. daily. 5. Multivitamins 1 p.o. daily. 6. Thiamine 100 mg p.o. daily. 7. Aleve 220 mg 2 tablets b.i.d. p.r.n. 8. Losartan 50 mg one p.o. daily. 9. Methadone 10 mg 2 tablets q.a.m. as previously. 10.Nexium 40 mg 1 p.o. b.i.d. 11.Pepcid 20 mg 1 p.o.  q.h.s. 12.Tramadol 50 mg 2 tablets q.4 h. p.r.n. 13.Visine 2 drops to eyes b.i.d. p.r.n.  14.WelChol 625 mg 2 tablets t.i.d.  FOLLOWUP CARE: 1. Dr. Foy Guadalajara next week, the patient to call for appointment. 2. Dr. Sherene Sires in 1-2 weeks - the patient to call for appointment. 3. Discharge condition - improved.  Stable. 4. The patient is also to follow up with alcohol anonymous outpatient.     Kela Millin, M.D.     ACV/MEDQ  D:  06/30/2010  T:  06/30/2010  Job:  [id number]  cc:   Molly Maduro L. Foy Guadalajara, M.D. Fax: [phone]  Charlaine Dalton. Sherene Sires, MD, FCCP [address] [place] [address]  Electronically Signed by Donnalee Curry M.D. on 07/23/2010 10:43:23 AM

## 2010-07-27 NOTE — Op Note (Signed)
[place]. [facility]  Patient:    GERMAN, MANKE                      MRN: [id number] Proc. Date: 06/19/99 Adm. Date:  [id number] Disc. Date: [id number] Attending:  Emeterio Reeve                           Operative Report  PREOPERATIVE DIAGNOSIS:  Herniated disk at C5-6 and C6-7.  POSTOPERATIVE DIAGNOSIS:  Herniated disk at C5-6 and C6-7.  OPERATION PERFORMED:  Anterior cervical diskectomy and fusion at C5-6 and C6-7.  SURGEON:  Payton Doughty, M.D.  ASSISTANT:  ANESTHESIA:  General endotracheal.  PREP:  Sterile Betadine prep and scrub with alcohol wipe.  COMPLICATIONS:  None.  DESCRIPTION OF PROCEDURE:  The patient is a 45 year old right-handed white gentleman with a severe spondylosis and disk at 5-6 and 6-7.  The patient was taken to the operating room, smoothly anesthetized and intubated, and placed supine on the operating table in the halter head traction.  Following shave, prep and drape in the usual sterile fashion the skin was incised in the midline and the medial border of the sternocleidomastoid muscle slightly below the level of the carotid tubercle on the left side.  The platysma was identified, elevated, divided and undermined.  The sternocleidomastoid was identified.  Medial dissection revealed the carotid artery and jugular vein retracted laterally to the left, trachea and esophagus retracted laterally to the right exposing the bones of the anterior cervical spine.  A marker was placed.  Intraoperative x-ray obtained to confirm correctness of the level. Having confirmed correctness of the level, the longus coli was taken down.  A Shadowline retractor placed.  Under gross observation, diskectomy was carried out at 5-6 and 6-7.  The operating microscope was then brought in, the disk spreader placed at 5-6.  Diskectomy was carried out with herniated disk extending on the left side into the anterior epidural space.  This was removed and  the posterior longitudinal ligament divided, nerve roots carefully explored and found to be free.  At 6-7 a similar procedure was carried out under the operating microscope.  There was a larger disk found at 6-7 with more extensive involvement at the left C7 root in the anterior epidural space. Having removed both of these disks, 7 mm bone grafts were fashioned from patellar allograft and tapped into place.  A 48 mm A-line plate was then placed with two screws in C5, one in C6 and two in C7.  The screw were 4 x 14. Locking screws were placed.  Intraoperative x-ray showed good placement of bone grafts, plate and screws.  The wound was irrigated and hemostasis assured. The platysma was reapproximated with 3-0 Vicryl in interrupted fashion.  Subcutaneous tissues reapproximated with 3-0 Vicryl.  The skin was closed with 4-0 Vicryl in a running subcuticular fashion.  Benzoin and Steri-Strips were placed and made occlusive with Telfa and Op-Site.  The patient was then placed in an Aspen collar and returned to the recovery room in good condition. DD:  06/19/99 TD:  06/20/99 Job: [id number]
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
## 2010-07-27 NOTE — H&P (Signed)
. [facility]  Patient:    Mark [name], Mark [name]                        MRN: [id number] Adm. Date:  06/19/99 Attending:  Payton Doughty, M.D.                         History and Physical  ADMISSION DIAGNOSIS:  Cervical spondylosis at C5-6 and C6-C7.  SERVICE:  Neurosurgery.  HISTORY OF PRESENT ILLNESS:  Mark [name] is now a 45 year old right-handed white gentleman who in October 2000 underwent lumbar fusion at L4-5 and L5-S1.  He had had a prior diskectomy in the past.  He did well with that.  At that time, he had had some complaints of neck pain, which had become more prominent, as well as discomfort  across his shoulders; he also has positive Lhermittes and positive Hoffmanns sign and is admitted for an anterior cervical diskectomy and fusion.  PAST MEDICAL HISTORY:  Otherwise unremarkable.  MEDICATIONS:  He had taken Vicoprofen in the past and is now using Vicodin.  PRIOR SURGICAL HISTORY:  Fusion as well as the diskectomy in 1995 and deviated septum in 1998.  SOCIAL HISTORY:  Smokes a pack of cigarettes a day.  Does not drink alcohol. He is a Nutritional therapist.  He has been in and out of work and not working since his back operation.  FAMILY HISTORY:  Mom is 33 and in good health and no diseases.  Dad is 52, in fair health with diabetes and had three back operations.  REVIEW OF SYSTEMS:  Remarkable for leg pain when walking, weight loss and difficulty starting his urinary stream.  He has arm weakness, leg weakness, back pain, leg pain and neck pain.  PHYSICAL EXAMINATION:  HEENT:  Within normal limits.  NECK:  He has reasonable range of motion in his neck.  Extension causes discomfort in the interscapular region and a tingling sensation down his arms.  CHEST:  Diffuse crackles.  CARDIAC:  Regular rate and rhythm.  ABDOMEN:  Nontender.  No hepatosplenomegaly.  EXTREMITIES:  Without clubbing or cyanosis.  GU:  Deferred.  PERIPHERAL PULSES:   Good.  NEUROLOGIC:  He is awake, alert and oriented.  Motor exam shows 5/5 strength throughout the upper and lower extremities.  He has a little bit of numbness in  both hands in really non-dermatomal pattern.  Reflexes are 1 at the biceps, 2 at the triceps, 2 at the brachioradialis, 2 at the knees, 1 at the ankles. Hoffmanns sign is positive.  IMAGING STUDIES:  MRI shows spondylitic change with cord flattening at 5-6 and -7, with uncinate hypertrophy and neural foraminal of both the C6 and C7 nerve roots.  CLINICAL IMPRESSION:  Early spondylitic myelopathy.  PLAN:  The plan is for an anterior cervical diskectomy and fusion at 5-6 and 6-7. The risks and benefits of this approach have been discussed with him and he wishes to proceed. DD:  06/19/99 TD:  06/19/99 Job: [id number]
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
## 2010-07-27 NOTE — Op Note (Signed)
Mark [name], Mark [name]                        ACCOUNT NO.:  [id number]   MEDICAL RECORD NO.:  [id number]                   PATIENT TYPE:  AMB   LOCATION:  DAY                                  FACILITY:  [facility]   PHYSICIAN:  Thornton Park. Daphine Deutscher, M.D.             DATE OF BIRTH:  21-Feb-1966   DATE OF PROCEDURE:  10/26/2001  DATE OF DISCHARGE:                                 OPERATIVE REPORT   CCS#:  [id number]   PREOPERATIVE DIAGNOSES:  Gastroesophageal reflux disease.   POSTOPERATIVE DIAGNOSES:  Gastroesophageal reflux disease status post  laparoscopic Nissen fundoplication over a #50 lighted dilated with closure  of the hiatus.   SURGEON:  Luretha Murphy, M.D.   ASSISTANT:  Adolph Pollack, M.D.   ANESTHESIA:  General endotracheal.   OPERATIVE TIME:  2 hours.   DESCRIPTION OF PROCEDURE:  Mark [name] is a 45 year old hard working  fellow who has had some significant cervical and lumbar disk disease  requiring fusion and with chronic pain management. He has lived with severe  heartburn and nocturnal reflux. He has Mark [name] who has evaluated him  and referred him for  Nissen fundoplication. Informed consent was obtained  regarding this preoperatively.   PREOP MEDICINE:  1. Protonix 40 mg b.i.d.  2. Reglan 5 mg h.[name].   DESCRIPTION OF PROCEDURE:  With the patient in the supine position, the  abdomen was prepped with Betadine and draped sterilely. A longitudinal  incision was made above the umbilicus and through this and through a  pursestring suture, the Hasson cannula was introduced without difficulty. A  45 degree scope was used. Following insufflation, we placed four other  ports, one in the left upper quadrant oblique, 5 mm in the upper midline for  the little retractor, and two 10/11 in the right side obliquely for  dissection. Despite their location, there is still a lot of distance between  the EG junction and the port sites. I began by dissecting the right side  and  opening the gastrohepatic window and dissecting the right crus and was able  to easily create a window going posteriorly. However, I encountered a fairly  dense membrane on the left side and moved toward taking down the short  gastrics.   The short gastric dissection was tedious because the stomach was intimately  attached to the spleen at the top part of the spleen. However, we took our  time and we made careful dissection and freed up the stomach posteriorly  using the harmonic scalpel. This required placement of the scope in the left  upper quadrant and then coming down and gradually teasing this away and I  was able to free this and get this mobilized.   We then went back on the right side and grasped the stomach and pulled it  through and it seemed to come through and did not have much tension on it at  all.  It would stay over without any difficulty.   Mark [name] did not really have much of a hiatal hernia; however, I did  close his crura with two sutures using the endostitch and multiple ties to  securely close the hiatus. The 50 lighted bougie was then passed by Dr.  Rica Mast and it slipped in nicely and there was nice good closure of the  hiatus and it came on into the stomach without difficulty. With that in  place, we then brought around our stomach and caged that and found an  appropriate contiguous portion of the stomach to wrap and sew to. I then put  these into position and began my first stitch and then withdrew it as I took  my purchase through the esophagus. Three such sutures were then placed to  perform the wrap all with purchases of esophagus to anchor the wrap. These  were all tied, the first being extracorporeally and the bottom two being  intracorporeal ties. Staples were placed on the upper two knots for  radiographic purposes. Good healthy wrap is depicted in the chart and the  photograph. The patient seemed to tolerate the procedure well. Port sites   were injected with 0.5% Marcaine and lidocaine. The pursestring was tied  down and was secured and then all the wounds were closed with 4-0 Vicryl  with Benzoin and Steri-Strips. The patient was given  Toradol prior to  awakening and then was awakened and taken to the recovery room in  satisfactory condition.   FINAL DIAGNOSES:  Gastroesophageal reflux disease status post laparoscopic  Nissen fundoplication.                                               Thornton Park Daphine Deutscher, M.D.    MBM/MEDQ  D:  10/26/2001  T:  10/26/2001  Job:  [id number]   cc:   Molly Maduro L. Foy Guadalajara, M.D.   Vania Rea. Jarold Motto, M.D. [facility]

## 2010-07-27 NOTE — Op Note (Signed)
[place]. [facility]  Patient:    Mark [name]                       MRN: [id number] Proc. Date: 02/13/99 Adm. Date:  [id number] Attending:  Emeterio Reeve                           Operative Report  PREOPERATIVE DIAGNOSIS:  Spondylosis at L5-S1.  POSTOPERATIVE DIAGNOSIS:  Spondylosis and recurrent herniated disk at L5-S1.  SURGEON:  Payton Doughty, M.D.  SERVICE:  Neurosurgery.  ANESTHESIA:  General endotracheal.  PREPARATION:  Sterile Betadine prep and scrub with alcohol wipe.  COMPLICATIONS:  None.  ASSISTANT:  ______  BODY OF TEXT:  This is a 45 year old right-handed, white male with spondylosis nd low back pain and possible recurrent disk.  He was taken to the operating room.  Smooth anesthesia and intubated.  He was placed prone on the operating table. Following this, he was shaved, prepped, and draped in usual sterile fashion. The skin was infiltrated with 1% lidocaine with 1:400,000 epinephrine.  The skin was incised from the top of L5 to the bottom of S1.  The lamina of L5 and S1 were exposed bilaterally in the subperiosteal plane out over the facet joints.  The ars interarticularis lamina and inferior facet of L5 and the superior facet of S1 were resected without difficulty.  The nerve roots were dissected bilaterally.  On the left side, was found a recurrent disk elevating the left S1 root and interfering with the right S1-L5 root as it entered the neural foramen.  Following this, right-sided fusion cages were placed bilaterally 14 x 26 mm.  Intraoperative x-ray confirmed good placement of cages.  They were packed with bone grafts harvested  from the facet joint cap.  The wound was irrigated.  Hemostasis assured.  The fascia was reapproximated with #0 Vicryl in interrupted fashion.  The subcutaneous tissue was reapproximated with #0 Vicryl in interrupted fashion.  The subcuticular tissues were reapproximated with 3-0 Vicryl  in interrupted fashion.  The skin was closed with 3-0 nylon in a running locking fashion.  Betadine and Telfa dressing were applied and made occlusive with OpSite.  The patient was returned to the recovery room in good condition. DD:  02/13/99 TD:  02/14/99 Job: [id number]

## 2010-09-21 ENCOUNTER — Ambulatory Visit
Admission: RE | Admit: 2010-09-21 | Discharge: 2010-09-21 | Disposition: A | Discharge status: Home / Self Care | Payer: Managed Care, Other (non HMO) | Source: Ambulatory Visit | Attending: Family Medicine | Admitting: Family Medicine

## 2010-09-21 ENCOUNTER — Other Ambulatory Visit: Payer: Self-pay | Admitting: Family Medicine

## 2010-09-21 DIAGNOSIS — R911 Solitary pulmonary nodule: Secondary | ICD-10-CM

## 2011-03-20 ENCOUNTER — Emergency Department (HOSPITAL_COMMUNITY): Payer: PRIVATE HEALTH INSURANCE

## 2011-03-20 ENCOUNTER — Encounter (HOSPITAL_COMMUNITY): Payer: Self-pay | Admitting: Emergency Medicine

## 2011-03-20 ENCOUNTER — Other Ambulatory Visit: Payer: Self-pay

## 2011-03-20 ENCOUNTER — Emergency Department (HOSPITAL_COMMUNITY)
Admission: EM | Admit: 2011-03-20 | Discharge: 2011-03-20 | Disposition: A | Discharge status: Home / Self Care | Payer: PRIVATE HEALTH INSURANCE | Attending: Emergency Medicine | Admitting: Emergency Medicine

## 2011-03-20 DIAGNOSIS — J069 Acute upper respiratory infection, unspecified: Secondary | ICD-10-CM | POA: Insufficient documentation

## 2011-03-20 DIAGNOSIS — R05 Cough: Secondary | ICD-10-CM | POA: Insufficient documentation

## 2011-03-20 DIAGNOSIS — R059 Cough, unspecified: Secondary | ICD-10-CM | POA: Insufficient documentation

## 2011-03-20 DIAGNOSIS — F1010 Alcohol abuse, uncomplicated: Secondary | ICD-10-CM | POA: Insufficient documentation

## 2011-03-20 DIAGNOSIS — R0602 Shortness of breath: Secondary | ICD-10-CM | POA: Insufficient documentation

## 2011-03-20 DIAGNOSIS — R5383 Other fatigue: Secondary | ICD-10-CM | POA: Insufficient documentation

## 2011-03-20 DIAGNOSIS — R000 Tachycardia, unspecified: Secondary | ICD-10-CM | POA: Insufficient documentation

## 2011-03-20 DIAGNOSIS — J3489 Other specified disorders of nose and nasal sinuses: Secondary | ICD-10-CM | POA: Insufficient documentation

## 2011-03-20 DIAGNOSIS — R5381 Other malaise: Secondary | ICD-10-CM | POA: Insufficient documentation

## 2011-03-20 DIAGNOSIS — F3131 Bipolar disorder, current episode depressed, mild: Secondary | ICD-10-CM | POA: Insufficient documentation

## 2011-03-20 DIAGNOSIS — R079 Chest pain, unspecified: Secondary | ICD-10-CM | POA: Insufficient documentation

## 2011-03-20 DIAGNOSIS — Z79899 Other long term (current) drug therapy: Secondary | ICD-10-CM | POA: Insufficient documentation

## 2011-03-20 DIAGNOSIS — R112 Nausea with vomiting, unspecified: Secondary | ICD-10-CM | POA: Insufficient documentation

## 2011-03-20 DIAGNOSIS — E860 Dehydration: Secondary | ICD-10-CM

## 2011-03-20 DIAGNOSIS — F10929 Alcohol use, unspecified with intoxication, unspecified: Secondary | ICD-10-CM

## 2011-03-20 LAB — CBC
MCV: 86.4 fL (ref 78.0–100.0)
Platelets: 218 10*3/uL (ref 150–400)
RBC: 4.84 MIL/uL (ref 4.22–5.81)
WBC: 6.4 10*3/uL (ref 4.0–10.5)

## 2011-03-20 LAB — D-DIMER, QUANTITATIVE: D-Dimer, Quant: 0.22 ug/mL-FEU (ref 0.00–0.48)

## 2011-03-20 LAB — DIFFERENTIAL
Basophils Absolute: 0 10*3/uL (ref 0.0–0.1)
Lymphocytes Relative: 35 % (ref 12–46)
Lymphs Abs: 2.2 10*3/uL (ref 0.7–4.0)
Neutro Abs: 3.6 10*3/uL (ref 1.7–7.7)
Neutrophils Relative %: 57 % (ref 43–77)

## 2011-03-20 LAB — COMPREHENSIVE METABOLIC PANEL
AST: 40 U/L — ABNORMAL HIGH (ref 0–37)
Albumin: 4 g/dL (ref 3.5–5.2)
Alkaline Phosphatase: 101 U/L (ref 39–117)
Chloride: 97 mEq/L (ref 96–112)
Potassium: 3.3 mEq/L — ABNORMAL LOW (ref 3.5–5.1)
Total Bilirubin: 0.4 mg/dL (ref 0.3–1.2)
Total Protein: 6.9 g/dL (ref 6.0–8.3)

## 2011-03-20 LAB — ETHANOL: Alcohol, Ethyl (B): 245 mg/dL — ABNORMAL HIGH (ref 0–11)

## 2011-03-20 MED ORDER — SODIUM CHLORIDE 0.9 % IV SOLN
Freq: Once | INTRAVENOUS | Status: AC
  Administered 2011-03-20: 1000 mL via INTRAVENOUS

## 2011-03-20 MED ORDER — SODIUM CHLORIDE 0.9 % IV BOLUS (SEPSIS)
1000.0000 mL | Freq: Once | INTRAVENOUS | Status: AC
  Administered 2011-03-20: 1000 mL via INTRAVENOUS

## 2011-03-20 MED ORDER — ONDANSETRON HCL 4 MG/2ML IJ SOLN
4.0000 mg | Freq: Once | INTRAMUSCULAR | Status: AC
  Administered 2011-03-20: 4 mg via INTRAVENOUS
  Filled 2011-03-20: qty 2

## 2011-03-20 MED ORDER — HYDROMORPHONE HCL PF 1 MG/ML IJ SOLN: 0.5000 mg | Freq: Once | INTRAMUSCULAR | Status: DC

## 2011-03-20 MED ORDER — BENZONATATE 100 MG PO CAPS: 100.0000 mg | ORAL_CAPSULE | Freq: Three times a day (TID) | ORAL | Status: AC

## 2011-03-20 NOTE — ED Provider Notes (Signed)
History     CSN: [id number]  Arrival date & time 03/20/11  0547   First MD Initiated Contact with Patient 03/20/11 [phone]      Chief Complaint  Patient presents with  . Cough  . Nasal Congestion    (Consider location/radiation/quality/duration/timing/severity/associated sxs/prior treatment) Patient is a 46 y.o. male presenting with chest pain. The history is provided by the patient.  Chest Pain The chest pain began more than 2 weeks ago. Chest pain occurs constantly (Chest pain is constant with periods of worse pain.). The chest pain is unchanged. The quality of the pain is described as aching and sharp. Primary symptoms include fatigue, shortness of breath, cough, nausea and vomiting. Pertinent negatives for primary symptoms include no fever. Primary symptoms comment: He reports increasing weakness overall.     Past Medical History  Diagnosis Date  . Hypertension   . Hyperlipidemia     Past Surgical History  Procedure Date  . Back surgery   . Neck surgery   . Fundoplasty transthoracic 2003    Family History  Problem Relation Age of Onset  . Breast cancer Paternal Grandmother   . Lung cancer      was a smoker    History  Substance Use Topics  . Smoking status: Current Everyday Smoker -- 1.0 packs/day for 30 years    Types: Cigarettes  . Smokeless tobacco: Never Used  . Alcohol Use: Yes     liquor daily      Review of Systems  Constitutional: Positive for fatigue. Negative for fever and chills.  HENT: Negative.   Respiratory: Positive for cough and shortness of breath.   Cardiovascular: Positive for chest pain.  Gastrointestinal: Positive for nausea and vomiting.  Musculoskeletal: Negative.   Skin: Negative.   Neurological: Negative.     Allergies  Lisinopril  Home Medications   Current Outpatient Rx  Name Route Sig Dispense Refill  . GABAPENTIN 100 MG PO CAPS Oral Take 100 mg by mouth 3 (three) times daily.    Marland Kitchen METHADONE HCL 10 MG PO TABS  20 mg  daily.     . COLESEVELAM HCL 625 MG PO TABS  2 tablets three times daily with meals     . ESOMEPRAZOLE MAGNESIUM 40 MG PO CPDR  Take 30- 60 min before your first and last meals of the day 68 capsule 2  . FAMOTIDINE 20 MG PO TABS  One at bedtime 30 tablet 11  . LOSARTAN POTASSIUM 50 MG PO TABS Oral Take 50 mg by mouth daily.      . TRAMADOL HCL 50 MG PO TABS  Take up to 2 every 4 hours if needed 40 tablet 0    BP 162/98  Pulse 55  Temp(Src) 98 F (36.7 C) (Oral)  Resp 16  Wt 205 lb (92.987 kg)  SpO2 98%  Physical Exam  Constitutional: He appears well-developed and well-nourished.  HENT:  Head: Normocephalic.  Eyes: Conjunctivae and EOM are normal. Pupils are equal, round, and reactive to light.  Neck: Normal range of motion. Neck supple.  Cardiovascular: Regular rhythm.  Tachycardia present.   Pulmonary/Chest: Effort normal and breath sounds normal. He exhibits no tenderness.  Abdominal: Soft. Bowel sounds are normal. There is no tenderness. There is no rebound and no guarding.  Musculoskeletal: Normal range of motion.  Neurological: He is alert. No cranial nerve deficit.  Skin: Skin is warm and dry. No rash noted.  Psychiatric: He has a normal mood and affect.  ED Course  Procedures (including critical care time)   Labs Reviewed  CBC  DIFFERENTIAL  I-STAT, CHEM 8  I-STAT TROPONIN I  ETHANOL  D-DIMER, QUANTITATIVE   No results found.   No diagnosis found.    MDM  He reports feeling the same, no worsening, no improvement with fluids. All labs essentially normal. Discussed alcohol level with the patient in the room alone and offered help with dependence. He declines. No SI/HI.  10:40: Patient and family ready for discharge: heart rate 98.   Rodena Medin, PA-C 03/20/11 1042

## 2011-03-20 NOTE — ED Notes (Signed)
PA at bedside.

## 2011-03-20 NOTE — ED Notes (Signed)
MD at bedside. 

## 2011-03-20 NOTE — ED Notes (Signed)
Patient states that he has had the flu since Christmas. States he feels bad and "does not fell right at all." States he had an episode at home "where I was just convulsing." Patient states that he has been on Neurontin for 2 weeks due to a back injury 5 weeks ago.

## 2011-03-20 NOTE — ED Notes (Signed)
Patient transported to X-ray via stretcher 

## 2011-03-20 NOTE — ED Notes (Signed)
Patient transported to X-ray 

## 2011-03-20 NOTE — ED Provider Notes (Signed)
Medical screening examination/treatment/procedure(s) were performed by non-physician practitioner and as supervising physician I was immediately available for consultation/collaboration.  Nicholes Stairs, MD 03/20/11 1536

## 2011-03-20 NOTE — ED Notes (Signed)
Pt alert, nad, c/o cough and congestion, onset several weeks ago, resp even unlabored, skin pwd, denies fever

## 2011-03-20 NOTE — ED Notes (Signed)
Pt returned from xray to [phone]

## 2011-04-04 ENCOUNTER — Other Ambulatory Visit (HOSPITAL_BASED_OUTPATIENT_CLINIC_OR_DEPARTMENT_OTHER): Payer: Self-pay | Admitting: Family Medicine

## 2011-04-04 DIAGNOSIS — M545 Low back pain: Secondary | ICD-10-CM

## 2011-04-06 ENCOUNTER — Ambulatory Visit (HOSPITAL_BASED_OUTPATIENT_CLINIC_OR_DEPARTMENT_OTHER)
Admission: RE | Admit: 2011-04-06 | Discharge: 2011-04-06 | Disposition: A | Discharge status: Home / Self Care | Payer: PRIVATE HEALTH INSURANCE | Source: Ambulatory Visit | Attending: Family Medicine | Admitting: Family Medicine

## 2011-04-06 DIAGNOSIS — M47817 Spondylosis without myelopathy or radiculopathy, lumbosacral region: Secondary | ICD-10-CM | POA: Insufficient documentation

## 2011-04-06 DIAGNOSIS — M545 Low back pain: Secondary | ICD-10-CM

## 2011-04-06 DIAGNOSIS — M51379 Other intervertebral disc degeneration, lumbosacral region without mention of lumbar back pain or lower extremity pain: Secondary | ICD-10-CM | POA: Insufficient documentation

## 2011-04-06 DIAGNOSIS — M5137 Other intervertebral disc degeneration, lumbosacral region: Secondary | ICD-10-CM | POA: Insufficient documentation

## 2011-04-06 DIAGNOSIS — M549 Dorsalgia, unspecified: Secondary | ICD-10-CM | POA: Insufficient documentation

## 2011-04-06 DIAGNOSIS — R209 Unspecified disturbances of skin sensation: Secondary | ICD-10-CM

## 2011-04-22 ENCOUNTER — Ambulatory Visit: Payer: PRIVATE HEALTH INSURANCE | Attending: Family Medicine | Admitting: Physical Therapy

## 2011-04-22 DIAGNOSIS — M545 Low back pain, unspecified: Secondary | ICD-10-CM | POA: Insufficient documentation

## 2011-04-22 DIAGNOSIS — M2569 Stiffness of other specified joint, not elsewhere classified: Secondary | ICD-10-CM | POA: Insufficient documentation

## 2011-04-22 DIAGNOSIS — IMO0001 Reserved for inherently not codable concepts without codable children: Secondary | ICD-10-CM | POA: Insufficient documentation

## 2011-04-24 ENCOUNTER — Encounter: Payer: PRIVATE HEALTH INSURANCE | Admitting: Physical Therapy

## 2011-09-28 ENCOUNTER — Emergency Department (HOSPITAL_COMMUNITY)
Admission: EM | Admit: 2011-09-28 | Discharge: 2011-09-30 | Disposition: A | Discharge status: Other Acute Inpatient Hospital | Payer: Self-pay | Attending: Emergency Medicine | Admitting: Emergency Medicine

## 2011-09-28 ENCOUNTER — Encounter (HOSPITAL_COMMUNITY): Payer: Self-pay | Admitting: Emergency Medicine

## 2011-09-28 DIAGNOSIS — E785 Hyperlipidemia, unspecified: Secondary | ICD-10-CM | POA: Insufficient documentation

## 2011-09-28 DIAGNOSIS — Z79899 Other long term (current) drug therapy: Secondary | ICD-10-CM | POA: Insufficient documentation

## 2011-09-28 DIAGNOSIS — I10 Essential (primary) hypertension: Secondary | ICD-10-CM | POA: Insufficient documentation

## 2011-09-28 DIAGNOSIS — F1910 Other psychoactive substance abuse, uncomplicated: Secondary | ICD-10-CM | POA: Insufficient documentation

## 2011-09-28 DIAGNOSIS — F1010 Alcohol abuse, uncomplicated: Secondary | ICD-10-CM | POA: Insufficient documentation

## 2011-09-28 HISTORY — DX: Alcohol abuse, uncomplicated: F10.10

## 2011-09-28 HISTORY — DX: Other psychoactive substance abuse, uncomplicated: F19.10

## 2011-09-28 LAB — COMPREHENSIVE METABOLIC PANEL
AST: 86 U/L — ABNORMAL HIGH (ref 0–37)
Albumin: 4 g/dL (ref 3.5–5.2)
Calcium: 9.5 mg/dL (ref 8.4–10.5)
Chloride: 95 mEq/L — ABNORMAL LOW (ref 96–112)
Creatinine, Ser: 0.58 mg/dL (ref 0.50–1.35)
Total Bilirubin: 0.8 mg/dL (ref 0.3–1.2)

## 2011-09-28 LAB — CBC
MCV: 89.4 fL (ref 78.0–100.0)
Platelets: 134 10*3/uL — ABNORMAL LOW (ref 150–400)
RDW: 12.3 % (ref 11.5–15.5)
WBC: 5.9 10*3/uL (ref 4.0–10.5)

## 2011-09-28 LAB — RAPID URINE DRUG SCREEN, HOSP PERFORMED
Amphetamines: NOT DETECTED
Cocaine: NOT DETECTED
Opiates: NOT DETECTED
Tetrahydrocannabinol: NOT DETECTED

## 2011-09-28 MED ORDER — LORAZEPAM 2 MG/ML IJ SOLN
2.0000 mg | Freq: Once | INTRAMUSCULAR | Status: AC
  Administered 2011-09-28: 2 mg via INTRAVENOUS
  Filled 2011-09-28: qty 1

## 2011-09-28 MED ORDER — ONDANSETRON HCL 4 MG PO TABS
4.0000 mg | ORAL_TABLET | Freq: Three times a day (TID) | ORAL | Status: DC | PRN
  Administered 2011-09-29 (×2): 4 mg via ORAL
  Filled 2011-09-28 (×3): qty 1

## 2011-09-28 MED ORDER — ADULT MULTIVITAMIN W/MINERALS CH
1.0000 | ORAL_TABLET | Freq: Once | ORAL | Status: AC
  Administered 2011-09-28: 1 via ORAL

## 2011-09-28 MED ORDER — VITAMIN B-1 100 MG PO TABS
100.0000 mg | ORAL_TABLET | Freq: Once | ORAL | Status: AC
  Administered 2011-09-28: 100 mg via ORAL
  Filled 2011-09-28: qty 1

## 2011-09-28 MED ORDER — LORAZEPAM 2 MG/ML IJ SOLN
2.0000 mg | Freq: Once | INTRAMUSCULAR | Status: AC
  Administered 2011-09-28: 2 mg via INTRAVENOUS

## 2011-09-28 MED ORDER — NICOTINE 21 MG/24HR TD PT24
21.0000 mg | MEDICATED_PATCH | Freq: Every day | TRANSDERMAL | Status: DC
  Administered 2011-09-28 – 2011-09-29 (×2): 21 mg via TRANSDERMAL
  Filled 2011-09-28 (×3): qty 1

## 2011-09-28 MED ORDER — LORAZEPAM 2 MG/ML IJ SOLN: 1.0000 mg | Freq: Once | INTRAMUSCULAR | Status: AC

## 2011-09-28 MED ORDER — LORAZEPAM 1 MG PO TABS
0.0000 mg | ORAL_TABLET | Freq: Two times a day (BID) | ORAL | Status: DC
  Filled 2011-09-28: qty 1
  Filled 2011-09-28: qty 3

## 2011-09-28 MED ORDER — THIAMINE HCL 100 MG/ML IJ SOLN: 100.0000 mg | Freq: Every day | INTRAMUSCULAR | Status: DC

## 2011-09-28 MED ORDER — CLONIDINE HCL 0.1 MG PO TABS
0.1000 mg | ORAL_TABLET | Freq: Two times a day (BID) | ORAL | Status: DC
  Administered 2011-09-28 – 2011-09-29 (×3): 0.1 mg via ORAL
  Filled 2011-09-28 (×2): qty 1

## 2011-09-28 MED ORDER — FOLIC ACID 1 MG PO TABS: 1.0000 mg | ORAL_TABLET | Freq: Every day | ORAL | Status: DC

## 2011-09-28 MED ORDER — ONDANSETRON HCL 4 MG/2ML IJ SOLN
INTRAMUSCULAR | Status: AC
  Administered 2011-09-28: 16:00:00
  Filled 2011-09-28: qty 2

## 2011-09-28 MED ORDER — SODIUM CHLORIDE 0.9 % IV BOLUS (SEPSIS)
1000.0000 mL | Freq: Once | INTRAVENOUS | Status: AC
  Administered 2011-09-28: 1000 mL via INTRAVENOUS

## 2011-09-28 MED ORDER — VITAMIN B-1 100 MG PO TABS
100.0000 mg | ORAL_TABLET | Freq: Every day | ORAL | Status: DC
  Administered 2011-09-29: 100 mg via ORAL
  Filled 2011-09-28: qty 1

## 2011-09-28 MED ORDER — FOLIC ACID 1 MG PO TABS
1.0000 mg | ORAL_TABLET | Freq: Once | ORAL | Status: AC
  Administered 2011-09-28: 1 mg via ORAL
  Filled 2011-09-28: qty 1

## 2011-09-28 MED ORDER — IBUPROFEN 200 MG PO TABS
600.0000 mg | ORAL_TABLET | Freq: Three times a day (TID) | ORAL | Status: DC | PRN
  Administered 2011-09-29: 600 mg via ORAL
  Filled 2011-09-28: qty 3

## 2011-09-28 MED ORDER — LORAZEPAM 1 MG PO TABS
1.0000 mg | ORAL_TABLET | Freq: Four times a day (QID) | ORAL | Status: DC | PRN
  Administered 2011-09-28 – 2011-09-30 (×5): 1 mg via ORAL
  Administered 2011-09-30: 3 mg via ORAL
  Filled 2011-09-28: qty 1
  Filled 2011-09-28 (×2): qty 2
  Filled 2011-09-28 (×2): qty 1

## 2011-09-28 MED ORDER — ZOLPIDEM TARTRATE 5 MG PO TABS
5.0000 mg | ORAL_TABLET | Freq: Every evening | ORAL | Status: DC | PRN
  Administered 2011-09-29: 5 mg via ORAL
  Filled 2011-09-28: qty 1

## 2011-09-28 MED ORDER — LORAZEPAM 1 MG PO TABS
2.0000 mg | ORAL_TABLET | Freq: Four times a day (QID) | ORAL | Status: DC
  Administered 2011-09-28 – 2011-09-29 (×2): 1 mg via ORAL
  Administered 2011-09-29 (×2): 2 mg via ORAL
  Filled 2011-09-28 (×3): qty 2
  Filled 2011-09-28: qty 1

## 2011-09-28 MED ORDER — LORAZEPAM 2 MG/ML IJ SOLN: 1.0000 mg | Freq: Four times a day (QID) | INTRAMUSCULAR | Status: DC | PRN

## 2011-09-28 MED ORDER — LORAZEPAM 2 MG/ML IJ SOLN
INTRAMUSCULAR | Status: AC
  Administered 2011-09-28: 2 mg via INTRAVENOUS
  Filled 2011-09-28: qty 1

## 2011-09-28 MED ORDER — LORAZEPAM 1 MG PO TABS
1.0000 mg | ORAL_TABLET | Freq: Once | ORAL | Status: AC
  Administered 2011-09-28: 1 mg via ORAL
  Filled 2011-09-28: qty 1

## 2011-09-28 MED ORDER — ALUM & MAG HYDROXIDE-SIMETH 200-200-20 MG/5ML PO SUSP: 30.0000 mL | ORAL | Status: DC | PRN

## 2011-09-28 MED ORDER — CLONIDINE HCL 0.1 MG PO TABS
0.2000 mg | ORAL_TABLET | Freq: Once | ORAL | Status: AC
  Administered 2011-09-28: 0.2 mg via ORAL
  Filled 2011-09-28: qty 2

## 2011-09-28 MED ORDER — ADULT MULTIVITAMIN W/MINERALS CH
1.0000 | ORAL_TABLET | Freq: Every day | ORAL | Status: DC
  Administered 2011-09-29: 1 via ORAL
  Filled 2011-09-28 (×2): qty 1

## 2011-09-28 NOTE — ED Notes (Signed)
Bed:WA24<BR> Expected date:<BR> Expected time:<BR> Means of arrival:<BR> Comments:<BR> EMS

## 2011-09-28 NOTE — ED Notes (Signed)
Per EMS: Pt reports 24 hours of no alcohol and did take his Methadone this morning. Pt reports tremors and shaking while conscious. Wants detox. Denies SI and HI. 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 Received 4 of zofran for reported nausea. PMH: of drug abuse, "stomach surgery" "back surgery"; NKDA. A&O, cooperative, emotional.

## 2011-09-28 NOTE — ED Provider Notes (Addendum)
History     CSN: [id number]  Arrival date & time 09/28/11  1615   First MD Initiated Contact with Patient 09/28/11 1647      Chief Complaint  Patient presents with  . Drug Problem  . Tremors    (Consider location/radiation/quality/duration/timing/severity/associated sxs/prior treatment) HPI Patient here with alcohol withdrawal with last etoh intake yesterday.  Patient also takes methadone and took regular dose today.  Patient now complaining of sweating, shaking, and muscle cramps Past Medical History  Diagnosis Date  . Hypertension   . Hyperlipidemia   . 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 ETOH abuse     Past Surgical History  Procedure Date  . Back surgery   . Neck surgery   . Fundoplasty transthoracic 2003    Family History  Problem Relation Age of Onset  . Breast cancer Paternal Grandmother   . Lung cancer      was a smoker    History  Substance Use Topics  . Smoking status: Current Everyday Smoker -- 1.0 packs/day for 30 years    Types: Cigarettes  . Smokeless tobacco: Never Used  . Alcohol Use: Yes     liquor daily      Review of Systems  All other systems reviewed and are negative.    Allergies  Lisinopril  Home Medications   Current Outpatient Rx  Name Route Sig Dispense Refill  . COLESEVELAM HCL 625 MG PO TABS  2 tablets three times daily with meals     . ESOMEPRAZOLE MAGNESIUM 40 MG PO CPDR  Take 30- 60 min before your first and last meals of the day 68 capsule 2  . FAMOTIDINE 20 MG PO TABS  One at bedtime 30 tablet 11  . GABAPENTIN 100 MG PO CAPS Oral Take 100 mg by mouth 3 (three) times daily.    Marland Kitchen LOSARTAN POTASSIUM 50 MG PO TABS Oral Take 50 mg by mouth daily.      Marland Kitchen METHADONE HCL 10 MG PO TABS  20 mg daily.     . TRAMADOL HCL 50 MG PO TABS  Take up to 2 every 4 hours if needed 40 tablet 0    BP 166/93  Pulse 79  Temp 98.2 F (36.8 C) (Oral)  Resp 18  SpO2 97%  Physical Exam    Nursing note and vitals reviewed. Constitutional: He is oriented to person, place, and time. He appears well-developed and well-nourished.  HENT:  Head: Normocephalic and atraumatic.  Eyes: Conjunctivae and EOM are normal. Pupils are equal, round, and reactive to light.  Neck: Normal range of motion. Neck supple.  Cardiovascular: Tachycardia present.   Pulmonary/Chest: Effort normal and breath sounds normal.  Abdominal: Soft. Bowel sounds are normal.  Musculoskeletal: Normal range of motion.  Neurological: He is alert and oriented to person, place, and time.  Skin: Skin is warm and dry.  Psychiatric: He has a normal mood and affect.    ED Course  Procedures (including critical care time)   Labs Reviewed  CBC  COMPREHENSIVE METABOLIC PANEL  ETHANOL  URINE RAPID DRUG SCREEN (HOSP PERFORMED)   No results found.   No diagnosis found.    Date: 09/28/2011  Rate: 69  Rhythm: normal sinus rhythm  QRS Axis: normal  Intervals: borderline prolonged qt  ST/T Wave abnormalities: normal  Conduction Disutrbances:none  Narrative Interpretation:   Old EKG Reviewed: changes noted   MDM  Patient had iv ativan  and hr is decreased to 80s and bp 130.  Patient feels better and is not having cramps. Patient to be placed on etoh withdrawal protocol and act to be consult regarding placement for detox.  Patient is continuing methadone and does wish to stop methadone at this time which may complicate placement.        Hilario Quarry, MD 09/28/11 1945  Patient with bp back up.  Clonidine 0.1 mg po bid ordered. Continues to await act eval.   Hilario Quarry, MD 09/28/11 2351

## 2011-09-29 MED ORDER — LOSARTAN POTASSIUM 50 MG PO TABS
50.0000 mg | ORAL_TABLET | Freq: Every day | ORAL | Status: DC
  Administered 2011-09-29: 50 mg via ORAL
  Filled 2011-09-29 (×2): qty 1

## 2011-09-29 MED ORDER — LORAZEPAM 1 MG PO TABS
2.0000 mg | ORAL_TABLET | Freq: Once | ORAL | Status: AC
  Administered 2011-09-29: 2 mg via ORAL

## 2011-09-29 NOTE — ED Notes (Signed)
Page Md  Molpus about the Pt's elevated BP Clonidine 0.1 mg po given  previously with minimal results. Pt on [place] protocol. Prn Ativan given BP 165/118, 75. A additional amount of Ativan was given 2mg  po. Rechecked the Pt's BP 148/82, 75. Pt alert x4  No c/o pain will continue to monitor.

## 2011-09-29 NOTE — ED Notes (Signed)
ACT team is in room.

## 2011-09-29 NOTE — ED Notes (Signed)
Report received from night shift. First contact with pt. Pt in no acute distress. Pt did not need any needs and this time, will continue to monitor.

## 2011-09-29 NOTE — ED Provider Notes (Signed)
Pt requesting detox. Resting quietly, nad. No tremor or shakes. No tachycardia. Pt hypertensive, hx same, will do med rec placing back on his normal bp med.  Discussed w act team - awaiting placement.   Suzi Roots, MD 09/29/11 [phone]

## 2011-09-29 NOTE — ED Notes (Addendum)
Pt was last seem resting in room. Staff remains available at nursing station for any assistance. Patient called out for help, the call light went off as the result, as Quincess went in the room to answer the call, then we heard a thump. All staff rushed to the room and found pt on floor, sitting up, alert, oriented. denied LOC, denied injury, able to move all extremities, initially reports soreness at right armpit but now subsided. Bed had 1 side rail down because he had a bed side table positioned to have patient eat lunch. Pt states he wanted to get up to use the restroom, so he pushed the table away and accidentally went along with the table and fell. Pt is supposedly medically clear, ambulatory. The staff heard the call bell first, staff responded the call bell immediately, then we heard a thump afterward. CN and EDP notified

## 2011-09-29 NOTE — BH Assessment (Addendum)
Assessment Note   Mark [name] is a 46 y.o. male who presents to [facility] voluntarily requesting detox from alcohol. Pt states he has been drinking 1/2 pint to 1 pint daily for the past 2 years. Pt reports he has attempted detox one prior time but does not remember the dates or facility. Pt appeared very drowsy during the assessment and had to be woken up several times. He reports current withdrawal symptoms including feeling shaky, headache, ringing sound in ears, and anxiety. Pt also reports taking 10mg  of methadone twice daily. Pt states he does not want to detox from methadone at this time.     Pt denies SI, HI, AHVH, and other SA. Pt reports no SI attempts and no current outpatient providers. Pt reports hx of anxiety with no panic attacks. Pt denies history of depression. Pt reports he lives alone and identifies his mother as a social support.         Pt's mother Berneice Gandy ([phone]), wife Orlan Aversa [phone]) and step-father Vilma Prader, presented to ED requesting to speak with ACT. Family states pt threatened to kill himself by overdose Friday night. Wife also states that pt has threatened her and has been hiding knives in her room. She states he has been experiencing belifes of paranoia, stating the government is after him. Pt's mother states that pt has also expressed that he has been seeing ghosts in their home. Family states that pt has severe mood swings. Pt has no prior mental health hospitalizations.    Family states they believe pt is a threat to himself and others at this time. Wife states that she is exteremly concerned for the safety her two daughters who live in the home.          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 Hypertension   . Hyperlipidemia   . 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 ETOH abuse     Axis IV: other psychosocial or environmental problems Axis V: 51-60 moderate symptoms  Past Medical History:  Past Medical History  Diagnosis Date  . Hypertension   . Hyperlipidemia   . 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 ETOH abuse     Past Surgical History  Procedure Date  . Back surgery   . Neck surgery   . Fundoplasty transthoracic 2003    Family History:  Family History  Problem Relation Age of Onset  . Breast cancer Paternal Grandmother   . Lung cancer      was a smoker    Social History:  reports that he has been smoking Cigarettes.  He has a 30 pack-year smoking history. He has never used smokeless tobacco. He reports that he drinks alcohol. He reports that he does not use illicit drugs.  Additional Social History:  Alcohol / Drug Use History of alcohol / drug use?: Yes Substance #1 Name of Substance 1: alcohol  1 - Age of First Use: 44 1 - Amount (size/oz): 1/2-pint  1 - Frequency: daily 1 - Duration: 2 years 1 - Last Use / Amount: 2 days ago  CIWA: CIWA-Ar BP: 156/107 mmHg Pulse Rate: 85  Nausea and Vomiting: mild nausea with no vomiting Tactile Disturbances: moderate itching, pins and needles, burning or numbness Tremor: no tremor  Auditory Disturbances: not present Paroxysmal Sweats: no sweat visible Visual Disturbances: not present Anxiety: two Headache, Fullness in Head: moderate Agitation: two Orientation and Clouding of Sensorium: oriented and can do serial additions CIWA-Ar Total: 11  COWS:    Allergies:  Allergies  Allergen Reactions  . Lisinopril Anaphylaxis and Swelling    Home Medications:  (Not in a hospital admission)  OB/GYN Status:  No LMP for male patient.  General Assessment Data Location of Assessment: [facility] ED Living Arrangements: Parent;Alone (lives back and forth between his own home and his moter's) Can pt return to current living arrangement?: Yes Admission Status:  Voluntary Is patient capable of signing voluntary admission?: Yes Transfer from: Acute Hospital Referral Source: MD  Education Status Is patient currently in school?: No  Risk to self Suicidal Ideation: No Suicidal Intent: No Is patient at risk for suicide?: No Suicidal Plan?: No Access to Means: No What has been your use of drugs/alcohol within the last 12 months?: alcohol Previous Attempts/Gestures: No How many times?: 0  Other Self Harm Risks: none Triggers for Past Attempts: None known Intentional Self Injurious Behavior: None Family Suicide History: No Recent stressful life event(s):  (none noted) Persecutory voices/beliefs?: No Depression: Yes Depression Symptoms: Loss of interest in usual pleasures Substance abuse history and/or treatment for substance abuse?: Yes Suicide prevention information given to non-admitted patients: Not applicable  Risk to Others Homicidal Ideation: No Thoughts of Harm to Others: No Current Homicidal Intent: No Current Homicidal Plan: No Access to Homicidal Means: No Identified Victim: none History of harm to others?: No Assessment of Violence: None Noted Violent Behavior Description: cooperative Does patient have access to weapons?: No Criminal Charges Pending?: No Does patient have a court date: No  Psychosis Hallucinations: None noted Delusions: None noted  Mental Status Report Appear/Hygiene: Disheveled Eye Contact: Poor Motor Activity: Unremarkable Speech: Logical/coherent Level of Consciousness: Alert Mood: Anxious Affect: Appropriate to circumstance Anxiety Level: Moderate Thought Processes: Coherent;Relevant Judgement: Impaired Orientation: Person;Place;Time;Situation Obsessive Compulsive Thoughts/Behaviors: None  Cognitive Functioning Concentration: Normal Memory: Recent Intact;Remote Intact IQ: Average Insight: Poor Impulse Control: Poor Appetite: Fair Weight Loss: 0  Weight Gain: 0  Sleep:  Decreased Vegetative Symptoms: None  ADLScreening [facility]) Patient's cognitive ability adequate to safely complete daily activities?: Yes Patient able to express need for assistance with ADLs?: Yes Independently performs ADLs?: Yes  Abuse/Neglect [facility]) Physical Abuse: Denies Verbal Abuse: Denies Sexual Abuse: Denies  Prior Inpatient Therapy Prior Inpatient Therapy: Yes Prior Therapy Dates: states he does not remember Reason for Treatment: detox  Prior Outpatient Therapy Prior Outpatient Therapy: No Prior Therapy Dates: na Prior Therapy Facilty/Provider(s): na Reason for Treatment: na  ADL Screening (condition at time of admission) Patient's cognitive ability adequate to safely complete daily activities?: Yes Patient able to express need for assistance with ADLs?: Yes Independently performs ADLs?: Yes Weakness of Legs: None Weakness of Arms/Hands: None  Home Assistive Devices/Equipment Home Assistive Devices/Equipment: None    Abuse/Neglect Assessment (Assessment to be complete while patient is alone) Physical Abuse: Denies Verbal Abuse: Denies Sexual Abuse: Denies Exploitation of patient/patient's resources: Denies Self-Neglect: Denies     Merchant navy officer (For Healthcare) Advance Directive: Patient does not have advance directive;Patient would not like information Pre-existing out of facility DNR order (yellow form or pink MOST form): No Nutrition Screen Diet: Regular Unintentional weight loss greater than 10lbs within the last month: No Problems chewing or swallowing foods and/or liquids: No Home Tube Feeding or Total Parenteral Nutrition (TPN): No Patient appears  severely malnourished: No  Additional Information 1:1 In Past 12 Months?: No CIRT Risk: No Elopement Risk: No Does patient have medical clearance?: Yes     Disposition:  Disposition Disposition of Patient: Referred to;Inpatient treatment program ([facility] ) Type of inpatient  treatment program: Adult Patient referred to: [facility]  On Site Evaluation by:   Reviewed with Physician:     Marjean Donna 09/29/2011 9:34 AM

## 2011-09-29 NOTE — BHH Counselor (Signed)
Pt too drowsy to be assessed. Will assess later this am

## 2011-09-29 NOTE — ED Notes (Signed)
Pt C/O midline chest pain, EKG done NSR with borderline elevatated  QT, 147/114,74,9,Pt alert, MD Molpus made aware no new orders will monitor.

## 2011-09-30 MED ORDER — HALOPERIDOL 5 MG PO TABS
5.0000 mg | ORAL_TABLET | Freq: Once | ORAL | Status: AC
  Administered 2011-09-30: 5 mg via ORAL

## 2011-09-30 MED ORDER — HALOPERIDOL 5 MG PO TABS
ORAL_TABLET | ORAL | Status: AC
  Filled 2011-09-30: qty 1

## 2011-09-30 NOTE — ED Notes (Signed)
Pt left AMA after signing form. Alert and oriented x 3. VS are stable. Wife Cindy notified.

## 2011-09-30 NOTE — ED Notes (Signed)
Pt continuously is OOB disconnecting himself from the monitor and pacing back and forth from his room to nurses station and around [place]. Very restless after administering Haldol 5mg  and Lorazepam 1mg  at 0330. He received other doses of Lorazepam prior to those mentioned. He has asked several times about a taxi the time. He changed his mind after speaking to wife when wanting to leave AMA.

## 2011-09-30 NOTE — ED Notes (Signed)
Pt is at the nurses station asking to used the phone to call home to wife. SO that someone will pick him up. Explained to pt that his wife stated that she would be here to spk to the MD and other associated disiciplines  about pt. Plan for treatment. Pt pressed emergency light in his room attempting to call his wife.  He was informed that the phone hours began at 0900 as well as visiting hours. He stated that he wants to leave to smoke a cigarette. Has nicotine patch on rt shoulder. Stated that he felt like he was in a fish bowl and did not want to seek treatment feeling as such.   Discussed with pt that leaving was a risk: Forgetful, anxious, fine tremors, and slightly agitated.  Pt states excuses for the previously stated symtoms is from aging.

## 2011-09-30 NOTE — ED Notes (Signed)
Pt requested AMA form. He took off his electrodes and lead after stating that he felt better and was feeling fine. Pt had visible tremors and witnessed by another RN and NT.  ACT consultant informed that pt. Was wanting to leave AMA, Charge RN was informed as well as his wife.   After speaking to patient, proceeded back in the bed. Pt tearful, cooperative, and giving gratitude for being patient with him and not allowing him to give up easily. He stated that he was going to stay to writer and the NT. Pt is alert and oriented x 4.

## 2011-10-15 ENCOUNTER — Emergency Department (HOSPITAL_COMMUNITY): Payer: Self-pay

## 2011-10-15 ENCOUNTER — Emergency Department (HOSPITAL_COMMUNITY)
Admission: EM | Admit: 2011-10-15 | Discharge: 2011-10-17 | Disposition: A | Discharge status: Home / Self Care | Payer: Self-pay | Attending: Emergency Medicine | Admitting: Emergency Medicine

## 2011-10-15 ENCOUNTER — Encounter (HOSPITAL_COMMUNITY): Payer: Self-pay | Admitting: Emergency Medicine

## 2011-10-15 DIAGNOSIS — E785 Hyperlipidemia, unspecified: Secondary | ICD-10-CM | POA: Insufficient documentation

## 2011-10-15 DIAGNOSIS — F1910 Other psychoactive substance abuse, uncomplicated: Secondary | ICD-10-CM | POA: Insufficient documentation

## 2011-10-15 DIAGNOSIS — F17200 Nicotine dependence, unspecified, uncomplicated: Secondary | ICD-10-CM | POA: Insufficient documentation

## 2011-10-15 DIAGNOSIS — F1010 Alcohol abuse, uncomplicated: Secondary | ICD-10-CM | POA: Insufficient documentation

## 2011-10-15 DIAGNOSIS — Z79899 Other long term (current) drug therapy: Secondary | ICD-10-CM | POA: Insufficient documentation

## 2011-10-15 DIAGNOSIS — I10 Essential (primary) hypertension: Secondary | ICD-10-CM | POA: Insufficient documentation

## 2011-10-15 HISTORY — DX: Gastro-esophageal reflux disease without esophagitis: K21.9

## 2011-10-15 LAB — RAPID URINE DRUG SCREEN, HOSP PERFORMED
Amphetamines: NOT DETECTED
Cocaine: NOT DETECTED
Opiates: NOT DETECTED
Tetrahydrocannabinol: NOT DETECTED

## 2011-10-15 LAB — CBC WITH DIFFERENTIAL/PLATELET
Basophils Absolute: 0 10*3/uL (ref 0.0–0.1)
Basophils Relative: 1 % (ref 0–1)
Eosinophils Absolute: 0.1 10*3/uL (ref 0.0–0.7)
Eosinophils Relative: 2 % (ref 0–5)
HCT: 43.2 % (ref 39.0–52.0)
MCH: 31.1 pg (ref 26.0–34.0)
MCHC: 34.7 g/dL (ref 30.0–36.0)
MCV: 89.6 fL (ref 78.0–100.0)
Monocytes Absolute: 0.5 10*3/uL (ref 0.1–1.0)
Neutro Abs: 2.7 10*3/uL (ref 1.7–7.7)
RDW: 12.7 % (ref 11.5–15.5)

## 2011-10-15 LAB — COMPREHENSIVE METABOLIC PANEL
AST: 102 U/L — ABNORMAL HIGH (ref 0–37)
Albumin: 4.2 g/dL (ref 3.5–5.2)
Calcium: 9.2 mg/dL (ref 8.4–10.5)
Creatinine, Ser: 0.67 mg/dL (ref 0.50–1.35)
Total Protein: 7.5 g/dL (ref 6.0–8.3)

## 2011-10-15 LAB — ETHANOL: Alcohol, Ethyl (B): 294 mg/dL — ABNORMAL HIGH (ref 0–11)

## 2011-10-15 MED ORDER — LORAZEPAM 1 MG PO TABS: 0.0000 mg | ORAL_TABLET | Freq: Two times a day (BID) | ORAL | Status: DC

## 2011-10-15 MED ORDER — ADULT MULTIVITAMIN W/MINERALS CH
1.0000 | ORAL_TABLET | Freq: Every day | ORAL | Status: DC
  Administered 2011-10-15 – 2011-10-17 (×3): 1 via ORAL
  Filled 2011-10-15 (×3): qty 1

## 2011-10-15 MED ORDER — THIAMINE HCL 100 MG/ML IJ SOLN: 100.0000 mg | Freq: Every day | INTRAMUSCULAR | Status: DC

## 2011-10-15 MED ORDER — LORAZEPAM 1 MG PO TABS
1.0000 mg | ORAL_TABLET | Freq: Three times a day (TID) | ORAL | Status: DC | PRN
  Administered 2011-10-15 – 2011-10-16 (×2): 1 mg via ORAL
  Filled 2011-10-15 (×4): qty 1

## 2011-10-15 MED ORDER — SODIUM CHLORIDE 0.9 % IV BOLUS (SEPSIS)
1000.0000 mL | Freq: Once | INTRAVENOUS | Status: AC
  Administered 2011-10-15: 1000 mL via INTRAVENOUS

## 2011-10-15 MED ORDER — VITAMIN B-1 100 MG PO TABS
100.0000 mg | ORAL_TABLET | Freq: Every day | ORAL | Status: DC
  Administered 2011-10-15 – 2011-10-17 (×3): 100 mg via ORAL
  Filled 2011-10-15 (×3): qty 1

## 2011-10-15 MED ORDER — ONDANSETRON HCL 4 MG PO TABS
4.0000 mg | ORAL_TABLET | Freq: Three times a day (TID) | ORAL | Status: DC | PRN
  Administered 2011-10-15 – 2011-10-16 (×4): 4 mg via ORAL
  Filled 2011-10-15 (×4): qty 1

## 2011-10-15 MED ORDER — PANTOPRAZOLE SODIUM 40 MG PO TBEC
40.0000 mg | DELAYED_RELEASE_TABLET | Freq: Every day | ORAL | Status: DC
  Administered 2011-10-15 – 2011-10-17 (×3): 40 mg via ORAL
  Filled 2011-10-15 (×3): qty 1

## 2011-10-15 MED ORDER — NICOTINE 21 MG/24HR TD PT24
21.0000 mg | MEDICATED_PATCH | Freq: Every day | TRANSDERMAL | Status: DC
  Administered 2011-10-15 – 2011-10-17 (×4): 21 mg via TRANSDERMAL
  Filled 2011-10-15 (×4): qty 1

## 2011-10-15 MED ORDER — FOLIC ACID 1 MG PO TABS
1.0000 mg | ORAL_TABLET | Freq: Every day | ORAL | Status: DC
  Administered 2011-10-15 – 2011-10-17 (×3): 1 mg via ORAL
  Filled 2011-10-15 (×3): qty 1

## 2011-10-15 MED ORDER — BISMUTH SUBSALICYLATE 262 MG/15ML PO SUSP
30.0000 mL | Freq: Four times a day (QID) | ORAL | Status: DC | PRN
  Filled 2011-10-15: qty 236

## 2011-10-15 MED ORDER — LORAZEPAM 1 MG PO TABS
0.0000 mg | ORAL_TABLET | Freq: Four times a day (QID) | ORAL | Status: DC
  Administered 2011-10-15: 2 mg via ORAL
  Administered 2011-10-16: 3 mg via ORAL
  Administered 2011-10-16: 2 mg via ORAL
  Administered 2011-10-16 (×3): 1 mg via ORAL
  Filled 2011-10-15: qty 2
  Filled 2011-10-15: qty 3
  Filled 2011-10-15: qty 2
  Filled 2011-10-15 (×2): qty 1
  Filled 2011-10-15: qty 2

## 2011-10-15 MED ORDER — LORAZEPAM 2 MG/ML IJ SOLN
1.0000 mg | Freq: Once | INTRAMUSCULAR | Status: AC
  Administered 2011-10-15: 1 mg via INTRAVENOUS
  Filled 2011-10-15: qty 1

## 2011-10-15 MED ORDER — ZOLPIDEM TARTRATE 5 MG PO TABS
5.0000 mg | ORAL_TABLET | Freq: Every evening | ORAL | Status: DC | PRN
  Administered 2011-10-16 (×2): 5 mg via ORAL
  Filled 2011-10-15 (×2): qty 1

## 2011-10-15 MED ORDER — ACETAMINOPHEN 325 MG PO TABS
650.0000 mg | ORAL_TABLET | ORAL | Status: DC | PRN
  Administered 2011-10-16: 650 mg via ORAL
  Filled 2011-10-15: qty 2

## 2011-10-15 MED ORDER — PANTOPRAZOLE SODIUM 40 MG IV SOLR
40.0000 mg | Freq: Once | INTRAVENOUS | Status: AC
  Administered 2011-10-15: 40 mg via INTRAVENOUS
  Filled 2011-10-15: qty 40

## 2011-10-15 MED ORDER — TRAMADOL HCL 50 MG PO TABS
50.0000 mg | ORAL_TABLET | Freq: Once | ORAL | Status: AC
  Administered 2011-10-15: 50 mg via ORAL
  Filled 2011-10-15: qty 1

## 2011-10-15 NOTE — ED Notes (Signed)
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 States that he has had diarrhea for the past 4 weeks. No need to go to bathroom at present.

## 2011-10-15 NOTE — ED Notes (Signed)
Dr. Glick at bedside.  

## 2011-10-15 NOTE — ED Notes (Signed)
Pt notified nurse that he feels shaky and needs more ativan.  Notified Dr. Preston Fleeting because pt not due for more ativan at this time.  Dr Preston Fleeting to reassess patient.

## 2011-10-15 NOTE — ED Notes (Signed)
Pt reports feeling jittery.  Hands visibly shaking.  Gave 1mg  Ativan per prn order.

## 2011-10-15 NOTE — ED Provider Notes (Signed)
He states that he is still feeling shaky and jittery, but on objective evaluation, there is minimal tremor. He is complaining of a headache and is given a dose of Ultram. He is to continue on the alcohol withdrawal prevention protocol.  Dione Booze, MD 10/15/11 [phone]

## 2011-10-15 NOTE — ED Notes (Signed)
CIWA assessment rescheduled according to nursing report at time of admission to TCU.  Pt due for CIWA at 2100.

## 2011-10-15 NOTE — ED Notes (Signed)
Pt reports he is here for detox and "being sick, it's all in my chest." Reports was on amoxicillin for "walking pna", did not complete last couple of days of abx. Detox from etoh and methadone. Last drink this am, normally drinks "pint plus." Last dose methadone this am 0700, 20mg . "I've also been having seizures. I forgot to tell the other nurse. The last one was the worst one, I was awake for that one. My right side just goes violent."

## 2011-10-15 NOTE — ED Provider Notes (Signed)
History     CSN: [id number]  Arrival date & time 10/15/11  1220   First MD Initiated Contact with Patient 10/15/11 1235      Chief Complaint  Patient presents with  . Pneumonia  . wants detox     off of etoh and methadone    (Consider location/radiation/quality/duration/timing/severity/associated sxs/prior treatment) HPI  46 year old male presents requesting for detox from alcohol and methadone and for evaluation of a possible pneumonia.  She reports for the past month he has had chest congestion, cough productive with yellow sputum, sneezing, and nasal congestions.  Was seen by PCP and was given amox which he takes for 5 days and hasn't notice much improvement.  Denies SOB, hemoptysis, or rash.  Reports he is 1 pack daily smoker.    Pt primary reason for today's visit is to request detox from alcohol and methadone.  Sts he has been on Methadone for several years, but would like to come off from it because it makes him feel unease.  Methadone prescribed by his PCP since he was addicted to pain killer from having chronic neck/back pain.  Also reports drinking 1/5th liquor daily.  Last use yesterday.  Would like to be detox from alcohol abuse.  Relate use to depression, addiction, and self medication.  Denies HI/SI, auditory or visual hallucination.  Denies fever, chills, n/v at this time. Has hx of GERD and sts alcohol helps with his sxs.    Past Medical History  Diagnosis Date  . Hypertension   . Hyperlipidemia   . 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 ETOH abuse     Past Surgical History  Procedure Date  . Back surgery   . Neck surgery   . Fundoplasty transthoracic 2003    Family History  Problem Relation Age of Onset  . Breast cancer Paternal Grandmother   . Lung cancer      was a smoker    History  Substance Use Topics  . Smoking status: Current Everyday Smoker -- 1.0 packs/day for 30 years    Types: Cigarettes  . Smokeless  tobacco: Never Used  . Alcohol Use: Yes     liquor daily      Review of Systems  All other systems reviewed and are negative.    Allergies  Lisinopril  Home Medications   Current Outpatient Rx  Name Route Sig Dispense Refill  . COLESEVELAM HCL 625 MG PO TABS  2 tablets three times daily with meals     . ESOMEPRAZOLE MAGNESIUM 40 MG PO CPDR  Take 30- 60 min before your first and last meals of the day 68 capsule 2  . GABAPENTIN 100 MG PO CAPS Oral Take 100 mg by mouth 3 (three) times daily.    Marland Kitchen LOSARTAN POTASSIUM 50 MG PO TABS Oral Take 50 mg by mouth daily.      Marland Kitchen METHADONE HCL 10 MG PO TABS  20 mg daily.     . TRAMADOL HCL 50 MG PO TABS  Take up to 2 every 4 hours if needed 40 tablet 0    BP 170/112  Pulse 56  Temp 98.6 F (37 C) (Oral)  Resp 23  Wt 195 lb (88.451 kg)  SpO2 97%  Physical Exam  Nursing note and vitals reviewed. Constitutional: He appears well-developed and well-nourished. No distress.       Awake, alert, nontoxic appearance  HENT:  Head: Atraumatic.  Eyes: Conjunctivae  are normal. Right eye exhibits no discharge. Left eye exhibits no discharge.  Neck: Normal range of motion. Neck supple.  Cardiovascular: Normal rate and regular rhythm.   Pulmonary/Chest: Effort normal. No respiratory distress. He exhibits no tenderness.  Abdominal: Soft. There is no tenderness. There is no rebound.  Musculoskeletal: He exhibits no tenderness.       ROM appears intact, no obvious focal weakness  Neurological: He is alert. He has normal strength. No cranial nerve deficit or sensory deficit. GCS eye subscore is 4. GCS verbal subscore is 5. GCS motor subscore is 6.  Skin: Skin is warm and dry. No rash noted.  Psychiatric: He has a normal mood and affect. His speech is normal. Judgment and thought content normal. He is agitated. Cognition and memory are normal.    ED Course  Procedures (including critical care time)  Results for orders placed during the hospital  encounter of 10/15/11  CBC WITH DIFFERENTIAL      Component Value Range   WBC 4.8  4.0 - 10.5 K/uL   RBC 4.82  4.22 - 5.81 MIL/uL   Hemoglobin 15.0  13.0 - 17.0 g/dL   HCT 13.0  86.5 - 78.4 %   MCV 89.6  78.0 - 100.0 fL   MCH 31.1  26.0 - 34.0 pg   MCHC 34.7  30.0 - 36.0 g/dL   RDW 69.6  29.5 - 28.4 %   Platelets 141 (*) 150 - 400 K/uL   Neutrophils Relative 57  43 - 77 %   Neutro Abs 2.7  1.7 - 7.7 K/uL   Lymphocytes Relative 30  12 - 46 %   Lymphs Abs 1.4  0.7 - 4.0 K/uL   Monocytes Relative 10  3 - 12 %   Monocytes Absolute 0.5  0.1 - 1.0 K/uL   Eosinophils Relative 2  0 - 5 %   Eosinophils Absolute 0.1  0.0 - 0.7 K/uL   Basophils Relative 1  0 - 1 %   Basophils Absolute 0.0  0.0 - 0.1 K/uL  COMPREHENSIVE METABOLIC PANEL      Component Value Range   Sodium 139  135 - 145 mEq/L   Potassium 3.9  3.5 - 5.1 mEq/L   Chloride 97  96 - 112 mEq/L   CO2 31  19 - 32 mEq/L   Glucose, Bld 131 (*) 70 - 99 mg/dL   BUN 7  6 - 23 mg/dL   Creatinine, Ser 1.32  0.50 - 1.35 mg/dL   Calcium 9.2  8.4 - 44.0 mg/dL   Total Protein 7.5  6.0 - 8.3 g/dL   Albumin 4.2  3.5 - 5.2 g/dL   AST 102 (*) 0 - 37 U/L   ALT 40  0 - 53 U/L   Alkaline Phosphatase 125 (*) 39 - 117 U/L   Total Bilirubin 0.5  0.3 - 1.2 mg/dL   GFR calc non Af Amer >90  >90 mL/min   GFR calc Af Amer >90  >90 mL/min  ETHANOL      Component Value Range   Alcohol, Ethyl (B) 294 (*) 0 - 11 mg/dL  URINE RAPID DRUG SCREEN (HOSP PERFORMED)      Component Value Range   Opiates NONE DETECTED  NONE DETECTED   Cocaine NONE DETECTED  NONE DETECTED   Benzodiazepines NONE DETECTED  NONE DETECTED   Amphetamines NONE DETECTED  NONE DETECTED   Tetrahydrocannabinol NONE DETECTED  NONE DETECTED   Barbiturates NONE DETECTED  NONE DETECTED  Dg Chest 2 View  10/15/2011  *RADIOLOGY REPORT*  Clinical Data: Cough, chest pain.  CHEST - 2 VIEW  Comparison: 03/20/2011  Findings: Heart and mediastinal contours are within normal limits. No focal  opacities or effusions.  No acute bony abnormality.  Anterior cervical plate noted in the lower cervical and upper thoracic spine.  Superior screws are fractured.  This is stable dating back to prior study from 06/19/2010.  IMPRESSION: No active cardiopulmonary disease.  Original Report Authenticated By: Cyndie Chime, M.D.    1. Alcohol detox 2. Drug detox  MDM  Pt presents with cold sxs x 1 month with chest discomfort.  CXR unremarkable.   Pt request detox from methadone and alcohol. Med clearance initiated.    2:24 PM Alcohol level 294.  However pt appears to be clinically sober, making reasonable decision.  Not in DT.  Will give ativan, IVF, and protonix.  Will move to psych ED.     3:43 PM I have consulted with ACT team, who agrees to continue pt care.  Will place on CIWA protocol.    Fayrene Helper, PA-C 10/15/11 1629

## 2011-10-15 NOTE — ED Notes (Signed)
States that he was given Amoxicillin by his PCP 5 days ago due to symptoms related to Pneumonia. States that no CXR was performed. States that he has pain in his chest that he feels that is related to the pneumonia. He also wants detox from alcohol and methodone. States that he was given Methodone 10 mg by his PCP in 2006 due taking too much Vicodin due to his chronic back pain. States that he drinks whiskey daily 1 pint plus at a minimum.

## 2011-10-15 NOTE — ED Notes (Signed)
Pt's belongings are in TCU locker 30. 1 bag.

## 2011-10-15 NOTE — BH Assessment (Signed)
Assessment Note   Mark [name] is an 46 y.o. male. PT PRESENTS WITH INCREASE DEPRESSION 7 EXPERIENCING SOME WITHDRAWAL SYMPTOMS FROM ETOH. PT SAYS HE IS TIRED OF LIVING THIS WAY & WANTS HELP. PT SAYS THE LAST TIME HE WAS HERE FOR HELP HE BECAME ANXIOUS & LEFT AGAINST MEDICAL ADVISE BUT NOW IS REQUESTING TO GET HELP FOR HIS DRINKING & HELP GETTING OFF FROM METHADONE. PT DENIES ANY IDEATION & IS ABLE TO CONTRACT FOR SAFETY. PT HAS BEEN REFERRED TO [facility].  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 Hypertension   . Hyperlipidemia   . 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 ETOH abuse   . GERD (gastroesophageal reflux disease)    Axis IV: MARITAL ISSUES Axis V: 41-50 serious symptoms  Past Medical History:  Past Medical History  Diagnosis Date  . Hypertension   . Hyperlipidemia   . 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 ETOH abuse   . GERD (gastroesophageal reflux disease)     Past Surgical History  Procedure Date  . Back surgery   . Neck surgery   . Fundoplasty transthoracic 2003    Family History:  Family History  Problem Relation Age of Onset  . Breast cancer Paternal Grandmother   . Lung cancer      was a smoker    Social History:  reports that he has been smoking Cigarettes.  He has a 30 pack-year smoking history. He has never used smokeless tobacco. He reports that he drinks alcohol. He reports that he uses illicit drugs.  Additional Social History:  Alcohol / Drug Use History of alcohol / drug use?: Yes Longest period of sobriety (when/how long): 11YRS Negative Consequences of Use: Financial;Personal relationships Substance #1 Name of Substance 1: ETOH 1 - Age of First Use: 44 1 - Amount (size/oz): PINT 1 - Frequency: DAILY 1 - Duration: 2 YRS 1 - Last Use /  Amount: 10/15/11 Substance #2 Name of Substance 2: METHADONE 2 - Age of First Use: 40 2 - Amount (size/oz): 2(10MG  TAB) 2 - Frequency: DAILY 2 - Duration: ON GOING 2 - Last Use / Amount: 10/15/11  CIWA: CIWA-Ar BP: 169/92 mmHg Pulse Rate: 89  Nausea and Vomiting: mild nausea with no vomiting Tactile Disturbances: none Tremor: moderate, with patient's arms extended Auditory Disturbances: not present Paroxysmal Sweats: beads of sweat obvious on forehead Visual Disturbances: moderate sensitivity Anxiety: three Headache, Fullness in Head: moderate Agitation: somewhat more than normal activity Orientation and Clouding of Sensorium: oriented and can do serial additions CIWA-Ar Total: 19  COWS: Clinical Opiate Withdrawal Scale (COWS) Resting Pulse Rate: Pulse Rate 80 or below Sweating: Flushed or Observable moistness on face Restlessness: Able to sit still Pupil Size: Pupils possibly larger than normal for room light Bone or Joint Aches: Patient reports sever diffuse aching of joints/muscles Runny Nose or Tearing: Not present GI Upset: nausea or loose stool Tremor: Slight tremor observable Yawning: No yawning Anxiety or Irritability: Patient reports increasing irritability or anxiousness Gooseflesh Skin: Skin is smooth COWS Total Score: 10   Allergies:  Allergies  Allergen Reactions  . Lisinopril Anaphylaxis and Swelling    Home Medications:  (Not in a hospital admission)  OB/GYN Status:  No LMP for male patient.  General  Assessment Data Location of Assessment: [facility] ED ACT Assessment: Yes Living Arrangements: Parent Can pt return to current living arrangement?: Yes Admission Status: Voluntary Is patient capable of signing voluntary admission?: Yes Transfer from: Acute Hospital Referral Source: MD     Risk to self Suicidal Ideation: No Suicidal Intent: No Is patient at risk for suicide?: No Suicidal Plan?: No Access to Means: No What has been your use of  drugs/alcohol within the last 12 months?: PT ADMITS TO ABUSING ETOH 7 LAST USE WAS THIS AM 7 ALSO ADMITS TO REGULAR INTAKE OF METHADONE WHICH SHE WANTS TO GET OFF Previous Attempts/Gestures: No How many times?: 0  Other Self Harm Risks: NA Triggers for Past Attempts: Unpredictable Intentional Self Injurious Behavior: None Family Suicide History: No Recent stressful life event(s): Turmoil (Comment) Persecutory voices/beliefs?: No Depression: Yes Depression Symptoms: Loss of interest in usual pleasures;Feeling worthless/self pity Substance abuse history and/or treatment for substance abuse?: Yes Suicide prevention information given to non-admitted patients: Not applicable  Risk to Others Homicidal Ideation: No Thoughts of Harm to Others: No Current Homicidal Intent: No Current Homicidal Plan: No Access to Homicidal Means: No Identified Victim: NA History of harm to others?: No Assessment of Violence: None Noted Violent Behavior Description: COOPERATIVE Does patient have access to weapons?: No Criminal Charges Pending?: No Does patient have a court date: No  Psychosis Hallucinations: None noted Delusions: None noted  Mental Status Report Appear/Hygiene: Improved Eye Contact: Poor Motor Activity:  (slight tremors in hands) Speech: Logical/coherent;Soft Level of Consciousness: Alert Mood: Depressed;Labile;Anhedonia Affect: Anxious;Appropriate to circumstance;Depressed;Sad Anxiety Level: Minimal Thought Processes: Coherent;Relevant Judgement: Impaired Orientation: Person;Place;Time;Situation Obsessive Compulsive Thoughts/Behaviors: None  Cognitive Functioning Concentration: Decreased Memory: Recent Intact;Remote Intact IQ: Average Insight: Poor Impulse Control: Poor Appetite: Poor Weight Loss: 0  Weight Gain: 0  Sleep: Decreased Total Hours of Sleep: 3  Vegetative Symptoms: None  ADLScreening [facility] Assessment Services) Patient's cognitive ability adequate to  safely complete daily activities?: Yes Patient able to express need for assistance with ADLs?: Yes Independently performs ADLs?: Yes  Abuse/Neglect [facility]) Physical Abuse: Denies Verbal Abuse: Denies Sexual Abuse: Denies  Prior Inpatient Therapy Prior Inpatient Therapy: Yes Prior Therapy Dates: CANT REMEMBER Prior Therapy Facilty/Provider(s): [facility] Reason for Treatment: DETOX  Prior Outpatient Therapy Prior Outpatient Therapy: No Prior Therapy Dates: NA Prior Therapy Facilty/Provider(s): NA Reason for Treatment: NA  ADL Screening (condition at time of admission) Patient's cognitive ability adequate to safely complete daily activities?: Yes Patient able to express need for assistance with ADLs?: Yes Independently performs ADLs?: Yes       Abuse/Neglect Assessment (Assessment to be complete while patient is alone) Physical Abuse: Denies Verbal Abuse: Denies Sexual Abuse: Denies Values / Beliefs Cultural Requests During Hospitalization: None Spiritual Requests During Hospitalization: None        Additional Information 1:1 In Past 12 Months?: No CIRT Risk: No Elopement Risk: No Does patient have medical clearance?: Yes     Disposition:  Disposition Disposition of Patient: Inpatient treatment program;Referred to ([facility]) Type of inpatient treatment program: Adult  On Site Evaluation by:   Reviewed with Physician:     Waldron Session 10/15/2011 9:26 PM

## 2011-10-15 NOTE — BHH Counselor (Addendum)
Pt was submitted for admission to [facility]. Thurman Coyer, [facility] confirmed there are no adults beds currently available. Franchot Gallo, MD reviewed clinical information and declined Pt at [facility] due to Pt requiring treatment for methadone. Dr. Allena Katz recommends treatment at [facility]. Notified Susa Day, assessment counselor, of disposition.  Harlin Rain Patsy [place], LPC

## 2011-10-16 MED ORDER — LORAZEPAM 2 MG/ML IJ SOLN
2.0000 mg | Freq: Once | INTRAMUSCULAR | Status: AC
  Administered 2011-10-16: 2 mg via INTRAVENOUS
  Filled 2011-10-16: qty 1

## 2011-10-16 MED ORDER — LORAZEPAM 1 MG PO TABS
2.0000 mg | ORAL_TABLET | Freq: Once | ORAL | Status: AC
  Administered 2011-10-16: 2 mg via ORAL
  Filled 2011-10-16: qty 2

## 2011-10-16 NOTE — ED Notes (Signed)
Spoke with Dr Read Drivers in reference to pt's CIWA scale,  Dr Read Drivers advised medications could be given IV and I could give dose Ativan now if needed, upon returning to pt's room I explained to him I had spoke with MD and advised for IV placement,  Pt states he had rather give the po medication time to work and call bell placed at bedside for pt to call out if he changed his mind prior to my hourly rounding,  Pt is comfortable at this time and in no acute distress

## 2011-10-16 NOTE — ED Notes (Signed)
[facility] was called to clarify pt does not have pneumonia. [facility] request more labs. Act team notified

## 2011-10-16 NOTE — ED Notes (Signed)
Edmd wants assess pt before d/c

## 2011-10-16 NOTE — [facility] Counselor (Signed)
Spoke with pt who was requesting to leave and pursue [facility] on his own. Explained to pt that they had not notified us of acceptance and we were waiting on disposition. Pt stated there were some things he needed to take care of (a pet, some things at the house, etc) before he could check in there and asked if we could just call him at home when they accepted. Explained that if pt leaves, we would not continue seeking placement for him. Discussed options for pt, as he is 2 days in on his detox. Discussed concerns with RN, who was present for conversation with pt. Pt was shaking and anxious during the entire conversation. Let EDP know that pt was wanting to leave and discussed with him. EDP will come by to visit with pt. Spoke with pt and RN again, expressing concern for pt (shaking, blood pressure, amount of medications given, etc) and hopes for his treatment, as he had been in ED not too long ago for same request, but left AMA as he was getting anxious & claustrophobic. Pt stated that he would like to have "5 minutes out side and then I'd be ok, this is just all overwhelming". Pt would be agreeable to stay for treatment and observation if EDP would allow security to take him outside for a few minutes. Will follow up with placement for pt

## 2011-10-16 NOTE — ED Notes (Signed)
edmd notified of pt's bp. New orders place. Pt denies any nausea but has visible tremors

## 2011-10-16 NOTE — ED Notes (Signed)
Pt unable to ambulate without assistance to and from bathroom,  Pt is shaking, sweating and has dry heaves,  He is sensitive to light and has a severe headache,  Followed medication order for CIWA scale done at this time, due to pt asleep at due time of 3am,  Will inform EDP of pt's condition,  As per Dr Preston Fleeting advised prior to him ending his shift.

## 2011-10-16 NOTE — ED Notes (Signed)
edmd notified of pts bp. Ed coming to assess pt.pt is stable at this time

## 2011-10-16 NOTE — ED Provider Notes (Signed)
Medical screening examination/treatment/procedure(s) were performed by non-physician practitioner and as supervising physician I was immediately available for consultation/collaboration.   Loren Racer, MD 10/16/11 2230

## 2011-10-16 NOTE — ED Notes (Signed)
Pt state he is feeling better. Pt denies any hallucinations. Will recheck pt's bp

## 2011-10-16 NOTE — B[facility] Counselor (Signed)
Info sent to [facility]. Got a call back requesting another BAC and had questions about pt having pneumonia. Then got a call back stating they would not be able to accept pt due to the pneumonia and to contact them again once that has cleared. Confirmed with RN and EDP that pt does NOT have pneumonia, chest XR was clear and no findings to support this. RN to call [facility] to clarify.

## 2011-10-17 ENCOUNTER — Encounter (HOSPITAL_COMMUNITY): Payer: Self-pay

## 2011-10-17 NOTE — ED Provider Notes (Signed)
10:16 AM The patient was seen and evaluated by the psychiatrist who believes the patient is safe for discharge.  Please see psychiatry consult note for full details  Lyanne Co, MD 10/17/11 1016

## 2011-10-17 NOTE — ED Notes (Addendum)
Pt refusing VS and ativan coverage for CIWA states if he takes the medication he is not going to be able to leave, pt observed actively hallucinating then called out to desk reporting that he sees his children in the room with him. When hallucinations explained to pt, pt states he was only dreaming, continues to be restless and bizarre, looking through trash in his room constantly, at desk every 1 or less stating he has slept 8 hours, needs constant to time of day, focused on being d/c and threatening to call a lawyer.

## 2011-11-15 ENCOUNTER — Emergency Department (HOSPITAL_COMMUNITY)
Admission: EM | Admit: 2011-11-15 | Discharge: 2011-11-17 | Disposition: A | Discharge status: Home / Self Care | Payer: Self-pay | Attending: Emergency Medicine | Admitting: Emergency Medicine

## 2011-11-15 ENCOUNTER — Encounter (HOSPITAL_COMMUNITY): Payer: Self-pay

## 2011-11-15 DIAGNOSIS — I10 Essential (primary) hypertension: Secondary | ICD-10-CM | POA: Insufficient documentation

## 2011-11-15 DIAGNOSIS — F17200 Nicotine dependence, unspecified, uncomplicated: Secondary | ICD-10-CM | POA: Insufficient documentation

## 2011-11-15 DIAGNOSIS — M7989 Other specified soft tissue disorders: Secondary | ICD-10-CM | POA: Insufficient documentation

## 2011-11-15 DIAGNOSIS — K219 Gastro-esophageal reflux disease without esophagitis: Secondary | ICD-10-CM | POA: Insufficient documentation

## 2011-11-15 DIAGNOSIS — F1010 Alcohol abuse, uncomplicated: Secondary | ICD-10-CM | POA: Insufficient documentation

## 2011-11-15 DIAGNOSIS — E785 Hyperlipidemia, unspecified: Secondary | ICD-10-CM | POA: Insufficient documentation

## 2011-11-15 HISTORY — DX: Mental disorder, not otherwise specified: F99

## 2011-11-15 LAB — CBC
MCH: 31.9 pg (ref 26.0–34.0)
MCHC: 35.5 g/dL (ref 30.0–36.0)
MCV: 90 fL (ref 78.0–100.0)
Platelets: 220 10*3/uL (ref 150–400)
RDW: 13.4 % (ref 11.5–15.5)

## 2011-11-15 LAB — RAPID URINE DRUG SCREEN, HOSP PERFORMED
Amphetamines: NOT DETECTED
Benzodiazepines: NOT DETECTED
Opiates: NOT DETECTED

## 2011-11-15 LAB — ACETAMINOPHEN LEVEL: Acetaminophen (Tylenol), Serum: <15 ug/mL (ref 10–30)

## 2011-11-15 LAB — COMPREHENSIVE METABOLIC PANEL
AST: 69 U/L — ABNORMAL HIGH (ref 0–37)
Albumin: 4.4 g/dL (ref 3.5–5.2)
Calcium: 9.5 mg/dL (ref 8.4–10.5)
Creatinine, Ser: 0.66 mg/dL (ref 0.50–1.35)

## 2011-11-15 MED ORDER — CLONIDINE HCL 0.1 MG PO TABS: 0.1000 mg | ORAL_TABLET | Freq: Every day | ORAL | Status: DC

## 2011-11-15 MED ORDER — PYRIDOXINE HCL 100 MG/ML IJ SOLN
100.0000 mg | Freq: Every day | INTRAMUSCULAR | Status: DC
  Administered 2011-11-15 – 2011-11-16 (×2): 100 mg via INTRAMUSCULAR
  Filled 2011-11-15: qty 1

## 2011-11-15 MED ORDER — THIAMINE HCL 100 MG/ML IJ SOLN: 100.0000 mg | Freq: Every day | INTRAMUSCULAR | Status: DC

## 2011-11-15 MED ORDER — CLONIDINE HCL 0.1 MG PO TABS: 0.1000 mg | ORAL_TABLET | ORAL | Status: DC

## 2011-11-15 MED ORDER — METHOCARBAMOL 500 MG PO TABS
500.0000 mg | ORAL_TABLET | Freq: Three times a day (TID) | ORAL | Status: DC | PRN
  Administered 2011-11-16: 500 mg via ORAL
  Filled 2011-11-15: qty 1

## 2011-11-15 MED ORDER — ADULT MULTIVITAMIN W/MINERALS CH
1.0000 | ORAL_TABLET | Freq: Every day | ORAL | Status: DC
  Administered 2011-11-15: 1 via ORAL

## 2011-11-15 MED ORDER — NICOTINE 21 MG/24HR TD PT24
21.0000 mg | MEDICATED_PATCH | Freq: Every day | TRANSDERMAL | Status: DC
  Administered 2011-11-15 – 2011-11-16 (×2): 21 mg via TRANSDERMAL
  Filled 2011-11-15 (×2): qty 1

## 2011-11-15 MED ORDER — LORAZEPAM 1 MG PO TABS: 0.0000 mg | ORAL_TABLET | Freq: Two times a day (BID) | ORAL | Status: DC

## 2011-11-15 MED ORDER — NAPROXEN 500 MG PO TABS
500.0000 mg | ORAL_TABLET | Freq: Two times a day (BID) | ORAL | Status: DC | PRN
  Administered 2011-11-16: 500 mg via ORAL
  Filled 2011-11-15: qty 1

## 2011-11-15 MED ORDER — LOPERAMIDE HCL 2 MG PO CAPS: 2.0000 mg | ORAL_CAPSULE | ORAL | Status: DC | PRN

## 2011-11-15 MED ORDER — FOLIC ACID 1 MG PO TABS
1.0000 mg | ORAL_TABLET | Freq: Every day | ORAL | Status: DC
  Administered 2011-11-15 – 2011-11-16 (×2): 1 mg via ORAL
  Filled 2011-11-15 (×2): qty 1

## 2011-11-15 MED ORDER — VITAMIN B-1 100 MG PO TABS
100.0000 mg | ORAL_TABLET | Freq: Every day | ORAL | Status: DC
  Administered 2011-11-15 – 2011-11-16 (×2): 100 mg via ORAL
  Filled 2011-11-15 (×2): qty 1

## 2011-11-15 MED ORDER — LORAZEPAM 1 MG PO TABS
1.0000 mg | ORAL_TABLET | Freq: Once | ORAL | Status: AC
  Administered 2011-11-15: 1 mg via ORAL
  Filled 2011-11-15: qty 1

## 2011-11-15 MED ORDER — ALUM & MAG HYDROXIDE-SIMETH 200-200-20 MG/5ML PO SUSP: 30.0000 mL | ORAL | Status: DC | PRN

## 2011-11-15 MED ORDER — ONDANSETRON 4 MG PO TBDP: 4.0000 mg | ORAL_TABLET | Freq: Four times a day (QID) | ORAL | Status: DC | PRN

## 2011-11-15 MED ORDER — ADULT MULTIVITAMIN W/MINERALS CH
1.0000 | ORAL_TABLET | Freq: Every day | ORAL | Status: DC
  Administered 2011-11-16: 1 via ORAL
  Filled 2011-11-15 (×2): qty 1

## 2011-11-15 MED ORDER — LORAZEPAM 1 MG PO TABS
1.0000 mg | ORAL_TABLET | Freq: Four times a day (QID) | ORAL | Status: DC | PRN
  Administered 2011-11-15: 1 mg via ORAL

## 2011-11-15 MED ORDER — PYRIDOXINE HCL 100 MG/ML IJ SOLN
100.0000 mg | Freq: Once | INTRAMUSCULAR | Status: DC
Start: 2011-11-15 — End: 2011-11-15
  Filled 2011-11-15: qty 1

## 2011-11-15 MED ORDER — HYDROXYZINE HCL 25 MG PO TABS
25.0000 mg | ORAL_TABLET | Freq: Four times a day (QID) | ORAL | Status: DC | PRN
  Administered 2011-11-15: 25 mg via ORAL
  Filled 2011-11-15: qty 1

## 2011-11-15 MED ORDER — LORAZEPAM 1 MG PO TABS
0.0000 mg | ORAL_TABLET | Freq: Four times a day (QID) | ORAL | Status: DC
  Administered 2011-11-15: 1 mg via ORAL
  Administered 2011-11-16 (×4): 2 mg via ORAL
  Filled 2011-11-15 (×3): qty 2
  Filled 2011-11-15: qty 1
  Filled 2011-11-15 (×2): qty 2

## 2011-11-15 MED ORDER — DICYCLOMINE HCL 20 MG PO TABS: 20.0000 mg | ORAL_TABLET | Freq: Four times a day (QID) | ORAL | Status: DC | PRN

## 2011-11-15 MED ORDER — VITAMIN B-12 1000 MCG PO TABS
1000.0000 ug | ORAL_TABLET | Freq: Every day | ORAL | Status: DC
  Administered 2011-11-15 – 2011-11-16 (×2): 1000 ug via ORAL
  Filled 2011-11-15 (×2): qty 1

## 2011-11-15 MED ORDER — ACETAMINOPHEN 325 MG PO TABS
650.0000 mg | ORAL_TABLET | ORAL | Status: DC | PRN
  Administered 2011-11-16: 650 mg via ORAL
  Filled 2011-11-15: qty 2

## 2011-11-15 MED ORDER — CLONIDINE HCL 0.1 MG PO TABS
0.1000 mg | ORAL_TABLET | Freq: Four times a day (QID) | ORAL | Status: DC
  Administered 2011-11-15 – 2011-11-16 (×6): 0.1 mg via ORAL
  Filled 2011-11-15 (×6): qty 1

## 2011-11-15 MED ORDER — ZOLPIDEM TARTRATE 5 MG PO TABS
5.0000 mg | ORAL_TABLET | Freq: Every evening | ORAL | Status: DC | PRN
  Administered 2011-11-15 – 2011-11-16 (×2): 5 mg via ORAL
  Filled 2011-11-15 (×2): qty 1

## 2011-11-15 MED ORDER — LORAZEPAM 1 MG PO TABS
1.0000 mg | ORAL_TABLET | Freq: Three times a day (TID) | ORAL | Status: DC | PRN
  Administered 2011-11-15: 1 mg via ORAL
  Filled 2011-11-15: qty 1

## 2011-11-15 MED ORDER — ONDANSETRON HCL 4 MG PO TABS: 4.0000 mg | ORAL_TABLET | Freq: Three times a day (TID) | ORAL | Status: DC | PRN

## 2011-11-15 MED ORDER — LORAZEPAM 2 MG/ML IJ SOLN: 1.0000 mg | Freq: Four times a day (QID) | INTRAMUSCULAR | Status: DC | PRN

## 2011-11-15 NOTE — ED Notes (Signed)
Pt is c/o of anxiety and is having noticeable tremors in his arms bilaterally.

## 2011-11-15 NOTE — BH Assessment (Signed)
Assessment Note   Mark [name] is an 46 y.o. male. Pt presents to [facility] requesting detox from etoh. Pt reports daily use of 1/5> of liquor daily for the past 2 years. Pt reports that his last use from etoh was midnight on 11-15-11 and  drank a couple of oz of whiskey. Pt reports withdraw symptoms to include mild hand tremors,chills,nausea, and increased anxiety as pt was requesting medication for anxiety. Pt denies hx of dt's,seizures, or black outs. Pt reports feeling depressed about his separation from his wife and reports that they are getting a divorce. Pt reports that he and his wife have two teenage daughters together that reside with his his wife.Pt reports that he is prescribed 10 mg of methadone that can be taken 6x daily as needed but reports that he only takes it once a day and denies abusing the medication. Pt reports that he was prescribed methadone years ago after he had neck and back fusion surgery. Pt is currently calm and cooperative in the ER. Pt denies SI,HI, and no AVH reported. Pt referred to [facility] for etoh detox admission.  Axis I: Alcohol Dependence Axis II: Deferred Axis III:  Past Medical History  Diagnosis Date  . Hypertension   . Hyperlipidemia   . 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 ETOH abuse   . GERD (gastroesophageal reflux disease)   . Mental disorder    Axis IV: economic problems, occupational problems, other psychosocial or environmental problems and problems related to social environment Axis V: 31-40 impairment in reality testing  Past Medical History:  Past Medical History  Diagnosis Date  . Hypertension   . Hyperlipidemia   . 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 ETOH abuse   . GERD (gastroesophageal reflux disease)   . Mental disorder     Past Surgical History  Procedure Date  . Back surgery   . Neck surgery   . Fundoplasty transthoracic 2003  . Nasal sinus  surgery     Family History:  Family History  Problem Relation Age of Onset  . Breast cancer Paternal Grandmother   . Lung cancer      was a smoker  . Hypertension Mother     Social History:  reports that he has been smoking Cigarettes.  He has a 30 pack-year smoking history. He has never used smokeless tobacco. He reports that he drinks alcohol. He reports that he uses illicit drugs.  Additional Social History:  Alcohol / Drug Use Pain Medications:  (Methadone-prescribed for pain r/t back/neck fusion surgery) Prescriptions:  (yes) History of alcohol / drug use?: Yes Negative Consequences of Use: Personal relationships;Financial Withdrawal Symptoms: Nausea / Vomiting;Tremors;Weakness;Fever / Chills Substance #1 Name of Substance 1:  (Etoh) 1 - Age of First Use:  (21) 1 - Amount (size/oz):  (1/5> whiskey) 1 - Frequency:  (Daily) 1 - Duration:  (past 2 yrs) 1 - Last Use / Amount:  (12 midnight, 11-15-11, cple oz of whiskey)  CIWA: CIWA-Ar BP: 148/91 mmHg Pulse Rate: 107  Tactile Disturbances: none Tremor: not visible, but can be felt fingertip to fingertip Auditory Disturbances: not present Paroxysmal Sweats: no sweat visible Visual Disturbances: not present Anxiety: two Headache, Fullness in Head: very mild Agitation: normal activity Orientation and Clouding of Sensorium: oriented and can do serial additions COWS:    Allergies:  Allergies  Allergen Reactions  . Lisinopril  Anaphylaxis and Swelling    Home Medications:  (Not in a hospital admission)  OB/GYN Status:  No LMP for male patient.  General Assessment Data Location of Assessment: [facility] ACT Assessment: Yes Living Arrangements: Parent Can pt return to current living arrangement?: Yes Admission Status: Voluntary Is patient capable of signing voluntary admission?: Yes Transfer from: Acute Hospital Referral Source: MD     Risk to self Suicidal Ideation: No Suicidal Intent: No Is patient at risk for  suicide?: No Suicidal Plan?: No Access to Means: No What has been your use of drugs/alcohol within the last 12 months?: etoh Previous Attempts/Gestures: No How many times?: 0  Other Self Harm Risks: none Triggers for Past Attempts: None known Intentional Self Injurious Behavior: None Family Suicide History: No Recent stressful life event(s): Other (Comment) (separated from wife) Persecutory voices/beliefs?: No Depression: Yes Depression Symptoms: Feeling worthless/self pity Substance abuse history and/or treatment for substance abuse?: Yes (etoh) Suicide prevention information given to non-admitted patients: Not applicable  Risk to Others Homicidal Ideation: No Thoughts of Harm to Others: No Current Homicidal Intent: No Current Homicidal Plan: No Access to Homicidal Means: No Identified Victim: na History of harm to others?: No Assessment of Violence: None Noted Violent Behavior Description: cooperative Does patient have access to weapons?: No Criminal Charges Pending?: No Does patient have a court date: No  Psychosis Hallucinations: None noted Delusions: None noted  Mental Status Report Appear/Hygiene: Other (Comment) (dressed in hospital scrubs) Eye Contact: Poor Motor Activity: Freedom of movement Speech: Logical/coherent Level of Consciousness: Alert Mood: Anxious Affect: Anxious;Appropriate to circumstance Anxiety Level: Minimal Thought Processes: Coherent;Relevant Judgement: Impaired Orientation: Person;Place;Time;Situation Obsessive Compulsive Thoughts/Behaviors: None  Cognitive Functioning Concentration: Normal Memory: Recent Intact;Remote Intact IQ: Average Insight: Fair Impulse Control: Poor Appetite: Fair Weight Loss: 0  Weight Gain: 0  Sleep: Decreased Total Hours of Sleep:  (7-8 hours) Vegetative Symptoms: None  ADLScreening [facility]) Patient's cognitive ability adequate to safely complete daily activities?: Yes Patient able  to express need for assistance with ADLs?: Yes Independently performs ADLs?: Yes (appropriate for developmental age)  Abuse/Neglect [facility]) Physical Abuse: Denies Verbal Abuse: Denies Sexual Abuse: Denies  Prior Inpatient Therapy Prior Inpatient Therapy: Yes Prior Therapy Dates: unsure of name  Prior Therapy Facilty/Provider(s): [facility] Reason for Treatment: detox  Prior Outpatient Therapy Prior Outpatient Therapy: No Prior Therapy Dates: na Prior Therapy Facilty/Provider(s): na Reason for Treatment: na  ADL Screening (condition at time of admission) Patient's cognitive ability adequate to safely complete daily activities?: Yes Patient able to express need for assistance with ADLs?: Yes Independently performs ADLs?: Yes (appropriate for developmental age) Weakness of Legs: None Weakness of Arms/Hands: None  Home Assistive Devices/Equipment Home Assistive Devices/Equipment: None    Abuse/Neglect Assessment (Assessment to be complete while patient is alone) Physical Abuse: Denies Verbal Abuse: Denies Sexual Abuse: Denies Exploitation of patient/patient's resources: Denies Self-Neglect: Denies Values / Beliefs Cultural Requests During Hospitalization: None Spiritual Requests During Hospitalization: None   Advance Directives (For Healthcare) Advance Directive: Patient has advance directive, copy in chart (reports that his wife has his advanced directive documents) Nutrition Screen- [facility] Have you recently lost weight without trying?: No Have you been eating poorly because of a decreased appetite?: No Malnutrition Screening Tool Score: 0   Additional Information 1:1 In Past 12 Months?: No CIRT Risk: No Elopement Risk: No Does patient have medical clearance?: Yes     Disposition:  Disposition Disposition of Patient: Referred to [facility] for etoh detox) Type of inpatient  treatment program: Adult  On Site Evaluation by:   Reviewed with Physician:     Bjorn Pippin 11/15/2011 8:11 PM

## 2011-11-15 NOTE — ED Notes (Signed)
Patient wanded by security and placed in paper scrubs.

## 2011-11-15 NOTE — ED Provider Notes (Signed)
3:50 PM Seems to be isolated ETOH. No HI or SI. Will ask ACT to evaluate for placement  Medical screening examination/treatment/procedure(s) were conducted as a shared visit with non-physician practitioner(s) and myself.  I personally evaluated the patient during the encounter   Lyanne Co, MD 11/15/11 [phone]

## 2011-11-15 NOTE — ED Notes (Signed)
Patient is requesting detox from alcohol and states he has been taking methadone x 8 years. Patient has swelling of both feet and ankles. Patient's last drink was at midnight and drank 1-2 ounce of liquor.

## 2011-11-15 NOTE — ED Provider Notes (Signed)
History     CSN: [id number]  Arrival date & time 11/15/11  0804   First MD Initiated Contact with Patient 11/15/11 0957      Chief Complaint  Patient presents with  . Medical Clearance    alcohol and methadone  . Leg Swelling    (Consider location/radiation/quality/duration/timing/severity/associated sxs/prior treatment) HPI Comments: Patient presents for alcohol detox. He reports drinking one pint up to one fifth per day of whiskey. He reports starting to drink due to marital stress. He reports his last drink at midnight last night. He takes methadone for chronic pain. He denies any other drug use. Patients also reports swelling of bilateral lower extremities that started 2 weeks ago. He reports associated numbness and tingling of bilateral hands and feet. He currently reports associated nausea, SOB, abdominal pain. He denies chest pain.    Past Medical History  Diagnosis Date  . Hypertension   . Hyperlipidemia   . 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 ETOH abuse   . GERD (gastroesophageal reflux disease)     Past Surgical History  Procedure Date  . Back surgery   . Neck surgery   . Fundoplasty transthoracic 2003  . Nasal sinus surgery     Family History  Problem Relation Age of Onset  . Breast cancer Paternal Grandmother   . Lung cancer      was a smoker  . Hypertension Mother     History  Substance Use Topics  . Smoking status: Current Everyday Smoker -- 1.0 packs/day for 30 years    Types: Cigarettes  . Smokeless tobacco: Never Used  . Alcohol Use: Yes     liquor daily 1 pint      Review of Systems  Constitutional: Negative for fever, chills, diaphoresis and fatigue.  HENT: Positive for neck pain. Negative for sore throat, mouth sores and neck stiffness.   Eyes: Negative for photophobia and visual disturbance.  Respiratory: Positive for shortness of breath. Negative for cough and choking.   Cardiovascular:  Positive for palpitations and leg swelling. Negative for chest pain.  Gastrointestinal: Positive for nausea, abdominal pain and diarrhea. Negative for vomiting and constipation.  Genitourinary: Negative for dysuria, frequency and flank pain.  Musculoskeletal: Positive for back pain and arthralgias.  Skin: Negative for rash and wound.  Neurological: Positive for numbness. Negative for dizziness, syncope, weakness, light-headedness and headaches.  Psychiatric/Behavioral: Positive for dysphoric mood. Negative for suicidal ideas and self-injury.    Allergies  Lisinopril  Home Medications   Current Outpatient Rx  Name Route Sig Dispense Refill  . METHADONE HCL 10 MG PO TABS Oral Take 20 mg by mouth daily.     . ADULT MULTIVITAMIN W/MINERALS CH Oral Take 1 tablet by mouth daily.      BP 159/104  Pulse 102  Temp 97.9 F (36.6 C) (Oral)  Resp 20  SpO2 97%  Physical Exam  Nursing note and vitals reviewed. Constitutional: He is oriented to person, place, and time. He appears well-developed and well-nourished. No distress.  HENT:  Head: Normocephalic and atraumatic.  Mouth/Throat: No oropharyngeal exudate.       Beefy red tongue. Patient reports subjective tongue swelling.   Eyes: Conjunctivae are normal. Pupils are equal, round, and reactive to light. No scleral icterus.  Neck: Normal range of motion. Neck supple.  Cardiovascular: Normal rate, regular rhythm and intact distal pulses.  Exam reveals no gallop and no friction rub.  No murmur heard.      Bilateral 2+ pitting edema of lower extremities.   Pulmonary/Chest: Effort normal and breath sounds normal.  Abdominal: Soft. He exhibits no distension.       Mild generalized tenderness to palpation.   Musculoskeletal: Normal range of motion. He exhibits edema and tenderness.  Neurological: He is alert and oriented to person, place, and time. No cranial nerve deficit. Coordination normal.       Speech normal and coherent. Strength  intact and equal bilaterally. Patient reports paresthesias and numbness of bilateral hands and feet.   Skin: Skin is warm and dry. No rash noted. He is not diaphoretic. No pallor.  Psychiatric:       Dysphoric mood/affect.     ED Course  Procedures (including critical care time)  Labs Reviewed  COMPREHENSIVE METABOLIC PANEL - Abnormal; Notable for the following:    Potassium 3.3 (*)     Chloride 93 (*)     Glucose, Bld 118 (*)     BUN 4 (*)     AST 69 (*)     Alkaline Phosphatase 130 (*)     All other components within normal limits  ETHANOL - Abnormal; Notable for the following:    Alcohol, Ethyl (B) 314 (*)     All other components within normal limits  CBC  ACETAMINOPHEN LEVEL  URINE RAPID DRUG SCREEN (HOSP PERFORMED)   No results found.   No diagnosis found.    MDM  10:13 AM Patient is medically cleared and psych hold orders have been placed.         Emilia Beck, [place] 11/18/11 [phone]

## 2011-11-15 NOTE — ED Notes (Signed)
Bedside report received from previous RN 

## 2011-11-16 LAB — ETHANOL: Alcohol, Ethyl (B): <11 mg/dL (ref 0–11)

## 2011-11-16 MED ORDER — PANTOPRAZOLE SODIUM 40 MG PO TBEC
40.0000 mg | DELAYED_RELEASE_TABLET | Freq: Every day | ORAL | Status: DC
  Administered 2011-11-16: 40 mg via ORAL
  Filled 2011-11-16: qty 1

## 2011-11-16 MED ORDER — POTASSIUM CHLORIDE CRYS ER 20 MEQ PO TBCR
40.0000 meq | EXTENDED_RELEASE_TABLET | Freq: Once | ORAL | Status: AC
  Administered 2011-11-16: 40 meq via ORAL
  Filled 2011-11-16: qty 2

## 2011-11-16 MED ORDER — METHADONE HCL 10 MG PO TABS
20.0000 mg | ORAL_TABLET | Freq: Every day | ORAL | Status: DC
  Administered 2011-11-16: 20 mg via ORAL
  Filled 2011-11-16: qty 2

## 2011-11-16 NOTE — ED Notes (Signed)
Pt up to bathroom.

## 2011-11-16 NOTE — ED Provider Notes (Signed)
Patient sleeping comfortably, no issues overnight as per nursing. He is pending [facility] placement.   Mark Canal, MD 11/16/11 [phone]

## 2011-11-16 NOTE — BHH Counselor (Signed)
Pt denied at [facility] per Everlean Alstrom due to pt already being on an Ativan detox regimen in hospital. Everlean Alstrom stated pt must finish Ativan at current hospital due to pt being at risk of going into DT's if he was transported at this time. Everlean Alstrom stated pt could be referred for treatment at [facility] on Monday once he was finished detoxing.

## 2011-11-16 NOTE — BHH Counselor (Signed)
Pt has been declined at [facility] due to Methadone use.

## 2011-11-16 NOTE — BHH Counselor (Addendum)
Per Leeanne Mannan at [facility], pt declined due to needing a higher level of care than they can provide. Per Ala Dach at [facility], he recommended pt be referred to [facility]- no beds available and to [facility]- left message at 6:14pm and waiting on call back. If no beds at these two hospitals, no other referrals are known and pt would finish detox in ED. Could then be referred to [facility] or [facility] on Monday.

## 2011-11-16 NOTE — ED Notes (Signed)
Up to bathroom

## 2011-11-16 NOTE — ED Notes (Signed)
Explained COWS and CIWA work in two different ways on the body and the withdrawal symptoms

## 2011-11-17 NOTE — ED Notes (Signed)
Pt cleared for d/c,.  Spoke with MD regarding pt safety to drive d/t night meds.  Pt is ambulatory and stable.  Pt cleared for safety.

## 2011-11-17 NOTE — ED Notes (Signed)
Pt belongings returned and pt d/c home.

## 2011-11-17 NOTE — BHH Counselor (Signed)
[facility] RN requested Clinical research associate speak with Mark [name] as he asked RN if he could be discharged. Writer gives Mark [name] latest info re: his disposition including bed availability and facilities where he has been declined. Mark [name] sts that he prefers to be d/c and indicates he will contact [facility] re: their 28 day treatment program upon d/c. He understands why he was declined at [facility] for detox program. Mark [name] denies withdrawal symptoms. He denies SI and HI and no psychosis. RN will contact EDP and Mark [name] will be d/c.

## 2011-11-17 NOTE — ED Provider Notes (Signed)
12:39 AM PT is voluntary - here for detox, wants to leave is pending [facility], aware that if he leaves will have to go through entire process again.  Wishes to be discharged. ACT has evaluated and he has ouptatient resources provided. No SI/ HI or psychosis, no active DTs  Sunnie Nielsen, MD 11/17/11 0040

## 2011-11-18 ENCOUNTER — Emergency Department (HOSPITAL_COMMUNITY): Payer: Self-pay

## 2011-11-18 ENCOUNTER — Emergency Department (HOSPITAL_COMMUNITY)
Admission: EM | Admit: 2011-11-18 | Discharge: 2011-11-19 | Disposition: A | Discharge status: Home / Self Care | Payer: Self-pay | Attending: Emergency Medicine | Admitting: Emergency Medicine

## 2011-11-18 ENCOUNTER — Encounter (HOSPITAL_COMMUNITY): Payer: Self-pay

## 2011-11-18 DIAGNOSIS — F1020 Alcohol dependence, uncomplicated: Secondary | ICD-10-CM | POA: Insufficient documentation

## 2011-11-18 DIAGNOSIS — F10239 Alcohol dependence with withdrawal, unspecified: Secondary | ICD-10-CM

## 2011-11-18 DIAGNOSIS — F19939 Other psychoactive substance use, unspecified with withdrawal, unspecified: Secondary | ICD-10-CM | POA: Insufficient documentation

## 2011-11-18 DIAGNOSIS — Z79899 Other long term (current) drug therapy: Secondary | ICD-10-CM | POA: Insufficient documentation

## 2011-11-18 DIAGNOSIS — E876 Hypokalemia: Secondary | ICD-10-CM

## 2011-11-18 DIAGNOSIS — F1123 Opioid dependence with withdrawal: Secondary | ICD-10-CM

## 2011-11-18 DIAGNOSIS — R0989 Other specified symptoms and signs involving the circulatory and respiratory systems: Secondary | ICD-10-CM | POA: Insufficient documentation

## 2011-11-18 DIAGNOSIS — I10 Essential (primary) hypertension: Secondary | ICD-10-CM | POA: Insufficient documentation

## 2011-11-18 DIAGNOSIS — F1193 Opioid use, unspecified with withdrawal: Secondary | ICD-10-CM

## 2011-11-18 DIAGNOSIS — R079 Chest pain, unspecified: Secondary | ICD-10-CM | POA: Insufficient documentation

## 2011-11-18 DIAGNOSIS — F10939 Alcohol use, unspecified with withdrawal, unspecified: Secondary | ICD-10-CM

## 2011-11-18 DIAGNOSIS — R05 Cough: Secondary | ICD-10-CM | POA: Insufficient documentation

## 2011-11-18 DIAGNOSIS — F17200 Nicotine dependence, unspecified, uncomplicated: Secondary | ICD-10-CM | POA: Insufficient documentation

## 2011-11-18 DIAGNOSIS — R059 Cough, unspecified: Secondary | ICD-10-CM | POA: Insufficient documentation

## 2011-11-18 DIAGNOSIS — F1920 Other psychoactive substance dependence, uncomplicated: Secondary | ICD-10-CM | POA: Insufficient documentation

## 2011-11-18 DIAGNOSIS — J189 Pneumonia, unspecified organism: Secondary | ICD-10-CM

## 2011-11-18 DIAGNOSIS — F1010 Alcohol abuse, uncomplicated: Secondary | ICD-10-CM

## 2011-11-18 DIAGNOSIS — R0609 Other forms of dyspnea: Secondary | ICD-10-CM | POA: Insufficient documentation

## 2011-11-18 DIAGNOSIS — F489 Nonpsychotic mental disorder, unspecified: Secondary | ICD-10-CM | POA: Insufficient documentation

## 2011-11-18 DIAGNOSIS — E785 Hyperlipidemia, unspecified: Secondary | ICD-10-CM | POA: Insufficient documentation

## 2011-11-18 LAB — COMPREHENSIVE METABOLIC PANEL
Albumin: 3.9 g/dL (ref 3.5–5.2)
Alkaline Phosphatase: 125 U/L — ABNORMAL HIGH (ref 39–117)
BUN: 7 mg/dL (ref 6–23)
CO2: 26 mEq/L (ref 19–32)
Chloride: 96 mEq/L (ref 96–112)
Creatinine, Ser: 0.58 mg/dL (ref 0.50–1.35)
GFR calc non Af Amer: >90 mL/min (ref >90)
Glucose, Bld: 105 mg/dL — ABNORMAL HIGH (ref 70–99)
Potassium: 3.3 mEq/L — ABNORMAL LOW (ref 3.5–5.1)
Sodium: 134 mEq/L — ABNORMAL LOW (ref 135–145)
Total Bilirubin: 0.5 mg/dL (ref 0.3–1.2)

## 2011-11-18 LAB — ETHANOL: Alcohol, Ethyl (B): 152 mg/dL — ABNORMAL HIGH (ref 0–11)

## 2011-11-18 LAB — POCT I-STAT TROPONIN I: Troponin i, poc: 0.02 ng/mL (ref 0.00–0.08)

## 2011-11-18 LAB — CBC WITH DIFFERENTIAL/PLATELET
Basophils Absolute: 0 10*3/uL (ref 0.0–0.1)
Basophils Relative: 1 % (ref 0–1)
HCT: 40.1 % (ref 39.0–52.0)
Hemoglobin: 14.5 g/dL (ref 13.0–17.0)
MCHC: 36.2 g/dL — ABNORMAL HIGH (ref 30.0–36.0)
Monocytes Absolute: 0.5 10*3/uL (ref 0.1–1.0)
Monocytes Relative: 8 % (ref 3–12)
Neutro Abs: 4.1 10*3/uL (ref 1.7–7.7)

## 2011-11-18 LAB — MAGNESIUM: Magnesium: 1.9 mg/dL (ref 1.5–2.5)

## 2011-11-18 LAB — RAPID URINE DRUG SCREEN, HOSP PERFORMED: Amphetamines: NOT DETECTED

## 2011-11-18 MED ORDER — ACETAMINOPHEN 325 MG PO TABS: 650.0000 mg | ORAL_TABLET | ORAL | Status: DC | PRN

## 2011-11-18 MED ORDER — IBUPROFEN 600 MG PO TABS: 600.0000 mg | ORAL_TABLET | Freq: Three times a day (TID) | ORAL | Status: DC | PRN

## 2011-11-18 MED ORDER — NICOTINE 21 MG/24HR TD PT24
21.0000 mg | MEDICATED_PATCH | Freq: Every day | TRANSDERMAL | Status: DC
  Administered 2011-11-18 – 2011-11-19 (×2): 21 mg via TRANSDERMAL
  Filled 2011-11-18 (×2): qty 1

## 2011-11-18 MED ORDER — METHOCARBAMOL 500 MG PO TABS
500.0000 mg | ORAL_TABLET | Freq: Three times a day (TID) | ORAL | Status: DC | PRN
  Administered 2011-11-19 (×2): 500 mg via ORAL
  Filled 2011-11-18 (×2): qty 1

## 2011-11-18 MED ORDER — ALUM & MAG HYDROXIDE-SIMETH 200-200-20 MG/5ML PO SUSP: 30.0000 mL | ORAL | Status: DC | PRN

## 2011-11-18 MED ORDER — LORAZEPAM 2 MG/ML IJ SOLN: 0.0000 mg | Freq: Two times a day (BID) | INTRAMUSCULAR | Status: DC

## 2011-11-18 MED ORDER — KETOROLAC TROMETHAMINE 30 MG/ML IJ SOLN
30.0000 mg | Freq: Once | INTRAMUSCULAR | Status: AC
  Administered 2011-11-18: 30 mg via INTRAVENOUS
  Filled 2011-11-18: qty 1

## 2011-11-18 MED ORDER — HYDROXYZINE HCL 25 MG PO TABS
25.0000 mg | ORAL_TABLET | Freq: Four times a day (QID) | ORAL | Status: DC | PRN
  Administered 2011-11-18 – 2011-11-19 (×3): 25 mg via ORAL
  Filled 2011-11-18 (×3): qty 1

## 2011-11-18 MED ORDER — ADULT MULTIVITAMIN W/MINERALS CH
1.0000 | ORAL_TABLET | Freq: Every day | ORAL | Status: DC
  Administered 2011-11-18 – 2011-11-19 (×2): 1 via ORAL
  Filled 2011-11-18 (×2): qty 1

## 2011-11-18 MED ORDER — FOLIC ACID 1 MG PO TABS
1.0000 mg | ORAL_TABLET | Freq: Every day | ORAL | Status: DC
  Administered 2011-11-18 – 2011-11-19 (×2): 1 mg via ORAL
  Filled 2011-11-18 (×2): qty 1

## 2011-11-18 MED ORDER — SODIUM CHLORIDE 0.9 % IV SOLN
1000.0000 mL | Freq: Once | INTRAVENOUS | Status: AC
  Administered 2011-11-18: 1000 mL via INTRAVENOUS

## 2011-11-18 MED ORDER — ONDANSETRON HCL 4 MG/2ML IJ SOLN
4.0000 mg | Freq: Once | INTRAMUSCULAR | Status: AC
  Administered 2011-11-18: 4 mg via INTRAVENOUS
  Filled 2011-11-18: qty 2

## 2011-11-18 MED ORDER — ZOLPIDEM TARTRATE 10 MG PO TABS: 10.0000 mg | ORAL_TABLET | Freq: Every evening | ORAL | Status: DC | PRN

## 2011-11-18 MED ORDER — VITAMIN B-1 100 MG PO TABS
100.0000 mg | ORAL_TABLET | Freq: Every day | ORAL | Status: DC
  Administered 2011-11-18 – 2011-11-19 (×2): 100 mg via ORAL
  Filled 2011-11-18 (×2): qty 1

## 2011-11-18 MED ORDER — ONDANSETRON 4 MG PO TBDP: 4.0000 mg | ORAL_TABLET | Freq: Four times a day (QID) | ORAL | Status: DC | PRN

## 2011-11-18 MED ORDER — THIAMINE HCL 100 MG/ML IJ SOLN: 100.0000 mg | Freq: Every day | INTRAMUSCULAR | Status: DC

## 2011-11-18 MED ORDER — LOPERAMIDE HCL 2 MG PO CAPS: 2.0000 mg | ORAL_CAPSULE | ORAL | Status: DC | PRN

## 2011-11-18 MED ORDER — NAPROXEN 500 MG PO TABS
500.0000 mg | ORAL_TABLET | Freq: Two times a day (BID) | ORAL | Status: DC | PRN
  Administered 2011-11-19: 500 mg via ORAL
  Filled 2011-11-18: qty 1

## 2011-11-18 MED ORDER — LORAZEPAM 1 MG PO TABS
1.0000 mg | ORAL_TABLET | Freq: Four times a day (QID) | ORAL | Status: DC | PRN
  Administered 2011-11-19: 1 mg via ORAL
  Filled 2011-11-18: qty 1

## 2011-11-18 MED ORDER — MOXIFLOXACIN HCL IN NACL 400 MG/250ML IV SOLN
400.0000 mg | Freq: Once | INTRAVENOUS | Status: AC
  Administered 2011-11-18: 400 mg via INTRAVENOUS
  Filled 2011-11-18: qty 250

## 2011-11-18 MED ORDER — LORAZEPAM 2 MG/ML IJ SOLN
0.0000 mg | Freq: Four times a day (QID) | INTRAMUSCULAR | Status: DC
  Administered 2011-11-18: 1 mg via INTRAVENOUS
  Administered 2011-11-18 (×2): 2 mg via INTRAVENOUS
  Filled 2011-11-18 (×2): qty 1

## 2011-11-18 MED ORDER — LORAZEPAM 2 MG/ML IJ SOLN
1.0000 mg | Freq: Four times a day (QID) | INTRAMUSCULAR | Status: DC | PRN
  Filled 2011-11-18 (×2): qty 1

## 2011-11-18 MED ORDER — DICYCLOMINE HCL 20 MG PO TABS
20.0000 mg | ORAL_TABLET | Freq: Four times a day (QID) | ORAL | Status: DC | PRN
  Administered 2011-11-19: 20 mg via ORAL
  Filled 2011-11-18: qty 1

## 2011-11-18 MED ORDER — SODIUM CHLORIDE 0.9 % IV SOLN
1000.0000 mL | INTRAVENOUS | Status: DC
  Administered 2011-11-18: 1000 mL via INTRAVENOUS

## 2011-11-18 MED ORDER — POTASSIUM CHLORIDE CRYS ER 20 MEQ PO TBCR
40.0000 meq | EXTENDED_RELEASE_TABLET | Freq: Three times a day (TID) | ORAL | Status: DC
  Administered 2011-11-18 – 2011-11-19 (×4): 40 meq via ORAL
  Filled 2011-11-18 (×4): qty 2

## 2011-11-18 NOTE — ED Notes (Signed)
Patient has been wanded and dress blue scrubs with red socks.  Patient has one personal belonging security locked in a locker.

## 2011-11-18 NOTE — ED Provider Notes (Cosign Needed)
History     CSN: [id number]  Arrival date & time 11/18/11  0700   First MD Initiated Contact with Patient 11/18/11 [phone]      Chief Complaint  Patient presents with  . Chest Pain    (Consider location/radiation/quality/duration/timing/severity/associated sxs/prior treatment) HPI  Patient reports he started having chest pain about 2 or 3 AM this morning. He states he mainly notices that when he calms down. He also states he notices his heart is racing and skipping. He indicates the pain is from his lower rib cage up to his neck. He states he has trouble breathing and he feels like a truck is sitting on his chest. He states the pain lasts anywhere from 15 minutes to a few hours.The pain is currently a "5" and at it's worse today was a "9". He has nausea without vomiting, and he gets sweaty, he also feels short of breath. He states the pain radiates into his shoulders and his back. He states he's had this pain off and on for a year. He relates it to anxiety. He states nothing he does makes it feel better, nothing he does makes it feel worse. Patient also relates he has a chronic cough that got worse the past 3-4 months. He has productive sputum that is green or clear. He states he had fever all last week up to 101. He also has had sore throat and rhinorrhea that is clear. He relates he's had pneumonia before and this feels like pneumonia. Of note the patient was just discharged from the ER a little over 24 hours ago for alcohol detox. He states he is drinking a pint to a fifth day however since he left the ED he's only had 3-4 beers. He states he feels "awful" with  body cramping. He denies any feeling of bugs crawling on him or having hallucinations. He states he feels depressed because he's going through a divorce. He denies suicidal or homicidal ideation. He states he's never been treated for depression and feels like his main problem is anxiety. Patient states his last formal detox was 3-4 years ago  at behavioral health. Patient has been in the ED  recently for alcohol problems. He was in the ED from July 20 through the 21st, August 6 through the eighth, and September 6 through the eighth leaving about 1 AM yesterday morning.  PCP Dr. Foy Guadalajara  Past Medical History  Diagnosis Date  . Hypertension   . Hyperlipidemia   . 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 ETOH abuse   . GERD (gastroesophageal reflux disease)   . Mental disorder     Past Surgical History  Procedure Date  . Back surgery   . Neck surgery   . Fundoplasty transthoracic 2003  . Nasal sinus surgery     Family History  Problem Relation Age of Onset  . Breast cancer Paternal Grandmother   . Lung cancer      was a smoker  . Hypertension Mother   MGM and MGF, PGM and PGF CAD at ages 59-70 FOP died of lung cancer   History  Substance Use Topics  . Smoking status: Current Everyday Smoker -- 1.0 packs/day for 30 years    Types: Cigarettes  . Smokeless tobacco: Never Used  . Alcohol Use: Yes 1 pint-1 fifth     s/p detox-had 2 beers last night   Plumber Going through divorce   Review of Systems  All other systems reviewed and are negative.    Allergies  Lisinopril  Home Medications   Current Outpatient Rx  Name Route Sig Dispense Refill  . METHADONE HCL 10 MG PO TABS Oral Take 20 mg by mouth daily.     . ADULT MULTIVITAMIN W/MINERALS CH Oral Take 1 tablet by mouth daily.      BP 149/85  Pulse 99  Temp 98.2 F (36.8 C) (Oral)  Resp 20  Ht 6\' 4"  (1.93 m)  Wt 200 lb (90.719 kg)  BMI 24.34 kg/m2  SpO2 97%  Vital signs normal except tachycardia   Physical Exam  Nursing note and vitals reviewed. Constitutional: He is oriented to person, place, and time. He appears well-developed and well-nourished.  Non-toxic appearance. He does not appear ill. No distress.  HENT:  Head: Normocephalic and atraumatic.  Right Ear: External ear normal.  Left Ear:  External ear normal.  Nose: Nose normal. No mucosal edema or rhinorrhea.  Mouth/Throat: Mucous membranes are normal. No dental abscesses or uvula swelling.       mucuc membranes dry  Eyes: Conjunctivae and EOM are normal. Pupils are equal, round, and reactive to light.  Neck: Normal range of motion and full passive range of motion without pain. Neck supple.  Cardiovascular: Normal rate, regular rhythm and normal heart sounds.  Exam reveals no gallop and no friction rub.   No murmur heard. Pulmonary/Chest: Effort normal and breath sounds normal. No respiratory distress. He has no wheezes. He has no rhonchi. He has no rales. He exhibits no tenderness and no crepitus.  Abdominal: Soft. Normal appearance and bowel sounds are normal. He exhibits no distension. There is no tenderness. There is no rebound and no guarding.  Musculoskeletal: Normal range of motion. He exhibits no edema and no tenderness.       Moves all extremities well.   Neurological: He is alert and oriented to person, place, and time. He has normal strength. No cranial nerve deficit.       + tremor  Skin: Skin is warm, dry and intact. No rash noted. No erythema. No pallor.  Psychiatric: His speech is normal and behavior is normal. His mood appears not anxious.       Appears anxious    ED Course  Procedures (including critical care time)   Medications  0.9 %  sodium chloride infusion (1000 mL Intravenous New Bag/Given 11/18/11 0807)    Followed by  0.9 %  sodium chloride infusion (1000 mL Intravenous New Bag/Given 11/18/11 0919)  thiamine (VITAMIN B-1) tablet 100 mg (100 mg Oral Given 11/18/11 1008)    Or  thiamine (B-1) injection 100 mg (  Intravenous See Alternative 11/18/11 1008)  folic acid (FOLVITE) tablet 1 mg (1 mg Oral Given 11/18/11 1008)  multivitamin with minerals tablet 1 tablet (1 tablet Oral Given 11/18/11 1008)  LORazepam (ATIVAN) injection 0-4 mg (1 mg Intravenous Given 11/18/11 0835)    Followed by  potassium chloride  SA (K-DUR,KLOR-CON) CR tablet 40 mEq (40 mEq Oral Given 11/18/11 0901)  ondansetron (ZOFRAN) injection 4 mg (4 mg Intravenous Given 11/18/11 0807)  ketorolac (TORADOL) 30 MG/ML injection 30 mg (30 mg Intravenous Given 11/18/11 0807)  moxifloxacin (AVELOX) IVPB 400 mg (400 mg Intravenous Given 11/18/11 0834)   Pt's pneumonia was treated with avelox which should treat CAP as well as klebsiella pneumonia which he is high risk for with his alcoholism. His infiltrate however is not characteristic of klebsiella and is probably CAP.  His withdrawal symptoms were treated.     Results for orders placed during the hospital encounter of 11/18/11  CBC WITH DIFFERENTIAL      Component Value Range   WBC 6.4  4.0 - 10.5 K/uL   RBC 4.49  4.22 - 5.81 MIL/uL   Hemoglobin 14.5  13.0 - 17.0 g/dL   HCT 16.1  09.6 - 04.5 %   MCV 89.3  78.0 - 100.0 fL   MCH 32.3  26.0 - 34.0 pg   MCHC 36.2 (*) 30.0 - 36.0 g/dL   RDW 40.9  81.1 - 91.4 %   Platelets 168  150 - 400 K/uL   Neutrophils Relative 64  43 - 77 %   Neutro Abs 4.1  1.7 - 7.7 K/uL   Lymphocytes Relative 24  12 - 46 %   Lymphs Abs 1.5  0.7 - 4.0 K/uL   Monocytes Relative 8  3 - 12 %   Monocytes Absolute 0.5  0.1 - 1.0 K/uL   Eosinophils Relative 5  0 - 5 %   Eosinophils Absolute 0.3  0.0 - 0.7 K/uL   Basophils Relative 1  0 - 1 %   Basophils Absolute 0.0  0.0 - 0.1 K/uL  COMPREHENSIVE METABOLIC PANEL      Component Value Range   Sodium 134 (*) 135 - 145 mEq/L   Potassium 3.3 (*) 3.5 - 5.1 mEq/L   Chloride 96  96 - 112 mEq/L   CO2 26  19 - 32 mEq/L   Glucose, Bld 105 (*) 70 - 99 mg/dL   BUN 7  6 - 23 mg/dL   Creatinine, Ser 7.82  0.50 - 1.35 mg/dL   Calcium 95.6  8.4 - 21.3 mg/dL   Total Protein 7.3  6.0 - 8.3 g/dL   Albumin 3.9  3.5 - 5.2 g/dL   AST 086 (*) 0 - 37 U/L   ALT 144 (*) 0 - 53 U/L   Alkaline Phosphatase 125 (*) 39 - 117 U/L   Total Bilirubin 0.5  0.3 - 1.2 mg/dL   GFR calc non Af Amer >90  >90 mL/min   GFR calc Af Amer >90  >90  mL/min  ETHANOL      Component Value Range   Alcohol, Ethyl (B) 152 (*) 0 - 11 mg/dL  URINE RAPID DRUG SCREEN (HOSP PERFORMED)      Component Value Range   Opiates NONE DETECTED  NONE DETECTED   Cocaine NONE DETECTED  NONE DETECTED   Benzodiazepines NONE DETECTED  NONE DETECTED   Amphetamines NONE DETECTED  NONE DETECTED   Tetrahydrocannabinol NONE DETECTED  NONE DETECTED   Barbiturates NONE DETECTED  NONE DETECTED  MAGNESIUM      Component Value Range   Magnesium 1.9  1.5 - 2.5 mg/dL  POCT I-STAT TROPONIN I      Component Value Range   Troponin i, poc 0.02  0.00 - 0.08 ng/mL   Comment 3            Laboratory interpretation all normal except hypokalemia, alcohol intoxication, elevated LFTs mild     Dg Chest 2 View  11/18/2011  *RADIOLOGY REPORT*  Clinical Data: Cough.  Chest pain.  CHEST - 2 VIEW  Comparison: 10/15/2011  Findings: Heart size is normal.  Mediastinal shadows are normal. There is patchy infiltrate in the right lower lobe consistent with pneumonia.  Remainder the chest is clear.  No effusion.  No acute bony finding.  Previous cervical fusion.  IMPRESSION: Patchy infiltrate in the right lower lobe consistent with bronchopneumonia.   Original Report Authenticated By: Thomasenia Sales, M.D.       Date: 11/18/2011  Rate: 100  Rhythm: sinus tachycardia  QRS Axis: normal  Intervals: QT prolonged  ST/T Wave abnormalities: normal  Conduction Disutrbances:none  Narrative Interpretation:   Old EKG Reviewed: changes noted from 09/29/2011 HR was 73 with QTc of 494    1. Community acquired pneumonia   2. Alcohol abuse   3. Alcohol withdrawal   4. Narcotic withdrawal   5. Hypokalemia     Plan admission for detox once hypokalemia and pneumonia treatment inititated  MDM          Ward Givens, MD 11/19/11 1551

## 2011-11-18 NOTE — ED Notes (Signed)
[id number]<GN> Expected date:<BR> Expected time:<BR> Means of arrival:<BR> Comments:<BR> Hold for room 21

## 2011-11-18 NOTE — ED Notes (Signed)
Pt states "I don't think I finished detoxing"

## 2011-11-18 NOTE — ED Notes (Signed)
Pt's mother called, is not POA, but states that pt needs further help with detox. Requesting MD referral for inpatient program. MD Lynelle Doctor notified

## 2011-11-18 NOTE — ED Notes (Signed)
Pt states he was just here for detox-states he has not been doing well with the detox on discharge-pt states he drank 2 beers last night-pt is acutely anxious and trembling-states he has left chest pain and states that he feels like his heartbeat is irregular and that he can not get a deep breath.  Pt states he wanted an antibiotic because "I thought I had a chest infection"

## 2011-11-19 LAB — POCT I-STAT, CHEM 8
BUN: 5 mg/dL — ABNORMAL LOW (ref 6–23)
Calcium, Ion: 1.22 mmol/L (ref 1.12–1.23)
Chloride: 103 mEq/L (ref 96–112)
Creatinine, Ser: 0.7 mg/dL (ref 0.50–1.35)

## 2011-11-19 MED ORDER — LORAZEPAM 1 MG PO TABS
0.0000 mg | ORAL_TABLET | Freq: Four times a day (QID) | ORAL | Status: DC
  Administered 2011-11-19 (×2): 2 mg via ORAL
  Filled 2011-11-19 (×2): qty 2

## 2011-11-19 MED ORDER — LORAZEPAM 2 MG/ML IJ SOLN
0.0000 mg | Freq: Four times a day (QID) | INTRAMUSCULAR | Status: DC
  Administered 2011-11-19: 2 mg via INTRAMUSCULAR

## 2011-11-19 MED ORDER — LORAZEPAM 1 MG PO TABS: 0.0000 mg | ORAL_TABLET | Freq: Two times a day (BID) | ORAL | Status: DC

## 2011-11-19 MED ORDER — LORAZEPAM 2 MG/ML IJ SOLN: 0.0000 mg | Freq: Two times a day (BID) | INTRAMUSCULAR | Status: DC

## 2011-11-19 MED ORDER — MOXIFLOXACIN HCL 400 MG PO TABS
400.0000 mg | ORAL_TABLET | Freq: Every day | ORAL | Status: DC
  Administered 2011-11-19: 400 mg via ORAL
  Filled 2011-11-19: qty 1

## 2011-11-19 MED ORDER — MOXIFLOXACIN HCL 400 MG PO TABS: 400.0000 mg | ORAL_TABLET | Freq: Every day | ORAL | Status: AC

## 2011-11-19 NOTE — ED Provider Notes (Signed)
10:50 PM patient requesting to go home. He is alert appropriate Glasgow Coma Score 15. Plan outpatient referrals for substance abuse. Plan prescription for Avelox Diagnosis #1 substance abuse #2 community-acquired pneumonia   Doug Sou, MD 11/19/11 1610

## 2011-11-19 NOTE — ED Provider Notes (Signed)
POCT I-STAT, CHEM 8      Component Value Range   Sodium 138  135 - 145 mEq/L   Potassium 3.9  3.5 - 5.1 mEq/L   Chloride 103  96 - 112 mEq/L   BUN 5 (*) 6 - 23 mg/dL   Creatinine, Ser 1.91  0.50 - 1.35 mg/dL   Glucose, Bld 96  70 - 99 mg/dL   Calcium, Ion 4.78  2.95 - 1.23 mmol/L   TCO2 25  0 - 100 mmol/L   Hemoglobin 13.9  13.0 - 17.0 g/dL   HCT 62.1  30.8 - 65.7 %   Pt's hypokalemia has been treated.   Devoria Albe, MD, [facility]   Ward Givens, MD 11/19/11 [phone]

## 2011-11-19 NOTE — ED Provider Notes (Signed)
Patient was seen by me yesterday for alcoholism. He also had a community acquired pneumonia and hypokalemia which was treated. We'll recheck his labs today and keep him on daily antibiotics for his pneumonia. Patient is waiting placement. Patient states he's feeling fine today. He's noted to have rare cough.  Devoria Albe, MD, FACEP   Ward Givens, MD 11/19/11 [phone]

## 2011-11-19 NOTE — ED Provider Notes (Signed)
Medical screening examination/treatment/procedure(s) were conducted as a shared visit with non-physician practitioner(s) and myself.  I personally evaluated the patient during the encounter  Will place in psych ER. Await placement. Medically clear  Lyanne Co, MD 11/19/11 1422

## 2011-11-20 NOTE — ED Notes (Signed)
Prescription change requested from max ofloxacin to Levofloxacin 750 mg daily one a day for 7 days written by Arthor Captain was called to [facility], Rx was changed due to cost.

## 2011-12-09 ENCOUNTER — Encounter (HOSPITAL_COMMUNITY): Payer: Self-pay

## 2011-12-09 ENCOUNTER — Emergency Department (HOSPITAL_COMMUNITY): Payer: Self-pay

## 2011-12-09 ENCOUNTER — Emergency Department (HOSPITAL_COMMUNITY)
Admission: EM | Admit: 2011-12-09 | Discharge: 2011-12-11 | Disposition: A | Discharge status: Home / Self Care | Payer: Self-pay | Attending: Emergency Medicine | Admitting: Emergency Medicine

## 2011-12-09 DIAGNOSIS — R6883 Chills (without fever): Secondary | ICD-10-CM | POA: Insufficient documentation

## 2011-12-09 DIAGNOSIS — J189 Pneumonia, unspecified organism: Secondary | ICD-10-CM | POA: Insufficient documentation

## 2011-12-09 DIAGNOSIS — M25476 Effusion, unspecified foot: Secondary | ICD-10-CM | POA: Insufficient documentation

## 2011-12-09 DIAGNOSIS — F1010 Alcohol abuse, uncomplicated: Secondary | ICD-10-CM | POA: Insufficient documentation

## 2011-12-09 DIAGNOSIS — M25579 Pain in unspecified ankle and joints of unspecified foot: Secondary | ICD-10-CM | POA: Insufficient documentation

## 2011-12-09 DIAGNOSIS — K219 Gastro-esophageal reflux disease without esophagitis: Secondary | ICD-10-CM | POA: Insufficient documentation

## 2011-12-09 DIAGNOSIS — M25473 Effusion, unspecified ankle: Secondary | ICD-10-CM | POA: Insufficient documentation

## 2011-12-09 DIAGNOSIS — IMO0001 Reserved for inherently not codable concepts without codable children: Secondary | ICD-10-CM | POA: Insufficient documentation

## 2011-12-09 DIAGNOSIS — F10939 Alcohol use, unspecified with withdrawal, unspecified: Secondary | ICD-10-CM | POA: Insufficient documentation

## 2011-12-09 DIAGNOSIS — R1084 Generalized abdominal pain: Secondary | ICD-10-CM | POA: Insufficient documentation

## 2011-12-09 DIAGNOSIS — F10239 Alcohol dependence with withdrawal, unspecified: Secondary | ICD-10-CM | POA: Insufficient documentation

## 2011-12-09 DIAGNOSIS — R110 Nausea: Secondary | ICD-10-CM | POA: Insufficient documentation

## 2011-12-09 DIAGNOSIS — E785 Hyperlipidemia, unspecified: Secondary | ICD-10-CM | POA: Insufficient documentation

## 2011-12-09 DIAGNOSIS — R209 Unspecified disturbances of skin sensation: Secondary | ICD-10-CM | POA: Insufficient documentation

## 2011-12-09 DIAGNOSIS — F1020 Alcohol dependence, uncomplicated: Secondary | ICD-10-CM | POA: Insufficient documentation

## 2011-12-09 DIAGNOSIS — R0602 Shortness of breath: Secondary | ICD-10-CM | POA: Insufficient documentation

## 2011-12-09 DIAGNOSIS — I10 Essential (primary) hypertension: Secondary | ICD-10-CM | POA: Insufficient documentation

## 2011-12-09 LAB — CBC WITH DIFFERENTIAL/PLATELET
Basophils Relative: 1 % (ref 0–1)
Basophils Relative: 1 % (ref 0–1)
Eosinophils Absolute: 0.8 10*3/uL — ABNORMAL HIGH (ref 0.0–0.7)
Eosinophils Absolute: 0.6 10*3/uL (ref 0.0–0.7)
Eosinophils Relative: 12 % — ABNORMAL HIGH (ref 0–5)
HCT: 41.2 % (ref 39.0–52.0)
Hemoglobin: 15.1 g/dL (ref 13.0–17.0)
Hemoglobin: 14 g/dL (ref 13.0–17.0)
Lymphs Abs: 0.9 10*3/uL (ref 0.7–4.0)
MCH: 31.7 pg (ref 26.0–34.0)
MCH: 31.8 pg (ref 26.0–34.0)
MCHC: 34.1 g/dL (ref 30.0–36.0)
MCHC: 34 g/dL (ref 30.0–36.0)
MCV: 92.9 fL (ref 78.0–100.0)
Monocytes Absolute: 0.5 10*3/uL (ref 0.1–1.0)
Monocytes Absolute: 0.4 10*3/uL (ref 0.1–1.0)
Monocytes Relative: 7 % (ref 3–12)
Monocytes Relative: 8 % (ref 3–12)
Neutro Abs: 3.1 10*3/uL (ref 1.7–7.7)
Neutrophils Relative %: 56 % (ref 43–77)
Neutrophils Relative %: 62 % (ref 43–77)
RBC: 4.4 MIL/uL (ref 4.22–5.81)

## 2011-12-09 LAB — RAPID URINE DRUG SCREEN, HOSP PERFORMED
Benzodiazepines: NOT DETECTED
Cocaine: NOT DETECTED
Opiates: NOT DETECTED

## 2011-12-09 LAB — COMPREHENSIVE METABOLIC PANEL
Albumin: 4.6 g/dL (ref 3.5–5.2)
BUN: 7 mg/dL (ref 6–23)
Calcium: 9.3 mg/dL (ref 8.4–10.5)
Creatinine, Ser: 0.65 mg/dL (ref 0.50–1.35)
GFR calc Af Amer: >90 mL/min (ref >90)
Glucose, Bld: 119 mg/dL — ABNORMAL HIGH (ref 70–99)
Potassium: 3.5 mEq/L (ref 3.5–5.1)
Total Protein: 7.8 g/dL (ref 6.0–8.3)

## 2011-12-09 LAB — BASIC METABOLIC PANEL
BUN: 6 mg/dL (ref 6–23)
Chloride: 99 mEq/L (ref 96–112)
Creatinine, Ser: 0.62 mg/dL (ref 0.50–1.35)
GFR calc Af Amer: >90 mL/min (ref >90)
Glucose, Bld: 97 mg/dL (ref 70–99)
Potassium: 3.6 mEq/L (ref 3.5–5.1)

## 2011-12-09 LAB — ETHANOL: Alcohol, Ethyl (B): 270 mg/dL — ABNORMAL HIGH (ref 0–11)

## 2011-12-09 LAB — ACETAMINOPHEN LEVEL: Acetaminophen (Tylenol), Serum: <15 ug/mL (ref 10–30)

## 2011-12-09 LAB — SALICYLATE LEVEL: Salicylate Lvl: <2 mg/dL — ABNORMAL LOW (ref 2.8–20.0)

## 2011-12-09 MED ORDER — ONDANSETRON HCL 4 MG/2ML IJ SOLN
4.0000 mg | Freq: Once | INTRAMUSCULAR | Status: AC
  Administered 2011-12-09: 4 mg via INTRAVENOUS
  Filled 2011-12-09: qty 2

## 2011-12-09 MED ORDER — MORPHINE SULFATE 4 MG/ML IJ SOLN
4.0000 mg | Freq: Once | INTRAMUSCULAR | Status: AC
  Administered 2011-12-09: 4 mg via INTRAVENOUS
  Filled 2011-12-09: qty 1

## 2011-12-09 MED ORDER — ZOLPIDEM TARTRATE 5 MG PO TABS
5.0000 mg | ORAL_TABLET | Freq: Every evening | ORAL | Status: DC | PRN
  Administered 2011-12-09 – 2011-12-11 (×2): 5 mg via ORAL
  Filled 2011-12-09 (×2): qty 1

## 2011-12-09 MED ORDER — LORAZEPAM 1 MG PO TABS
1.0000 mg | ORAL_TABLET | Freq: Three times a day (TID) | ORAL | Status: DC | PRN
  Administered 2011-12-09 – 2011-12-10 (×2): 1 mg via ORAL
  Filled 2011-12-09 (×2): qty 1

## 2011-12-09 MED ORDER — ACETAMINOPHEN 325 MG PO TABS
650.0000 mg | ORAL_TABLET | ORAL | Status: DC | PRN
  Administered 2011-12-10 – 2011-12-11 (×4): 650 mg via ORAL
  Filled 2011-12-09 (×4): qty 2

## 2011-12-09 MED ORDER — HALOPERIDOL LACTATE 5 MG/ML IJ SOLN
5.0000 mg | Freq: Once | INTRAMUSCULAR | Status: AC
  Administered 2011-12-09: 5 mg via INTRAMUSCULAR
  Filled 2011-12-09: qty 1

## 2011-12-09 MED ORDER — DEXTROSE 5 % IV SOLN
2.0000 g | Freq: Once | INTRAVENOUS | Status: AC
  Administered 2011-12-09: 2 g via INTRAVENOUS
  Filled 2011-12-09: qty 2

## 2011-12-09 MED ORDER — SODIUM CHLORIDE 0.9 % IV BOLUS (SEPSIS)
1000.0000 mL | Freq: Once | INTRAVENOUS | Status: AC
  Administered 2011-12-09: 1000 mL via INTRAVENOUS

## 2011-12-09 MED ORDER — LORAZEPAM 2 MG/ML IJ SOLN
1.0000 mg | Freq: Once | INTRAMUSCULAR | Status: AC
  Administered 2011-12-09: 1 mg via INTRAVENOUS
  Filled 2011-12-09: qty 1

## 2011-12-09 MED ORDER — NICOTINE 21 MG/24HR TD PT24
21.0000 mg | MEDICATED_PATCH | Freq: Every day | TRANSDERMAL | Status: DC
  Administered 2011-12-09 – 2011-12-11 (×3): 21 mg via TRANSDERMAL
  Filled 2011-12-09 (×3): qty 1

## 2011-12-09 MED ORDER — IOHEXOL 350 MG/ML SOLN
100.0000 mL | Freq: Once | INTRAVENOUS | Status: AC | PRN
  Administered 2011-12-09: 100 mL via INTRAVENOUS

## 2011-12-09 MED ORDER — ALUM & MAG HYDROXIDE-SIMETH 200-200-20 MG/5ML PO SUSP: 30.0000 mL | ORAL | Status: DC | PRN

## 2011-12-09 MED ORDER — ONDANSETRON HCL 8 MG PO TABS
4.0000 mg | ORAL_TABLET | Freq: Three times a day (TID) | ORAL | Status: DC | PRN
  Administered 2011-12-10 – 2011-12-11 (×2): 4 mg via ORAL
  Filled 2011-12-09 (×2): qty 1

## 2011-12-09 MED ORDER — DEXTROSE 5 % IV SOLN
500.0000 mg | Freq: Once | INTRAVENOUS | Status: AC
  Administered 2011-12-09: 500 mg via INTRAVENOUS
  Filled 2011-12-09: qty 500

## 2011-12-09 NOTE — ED Notes (Signed)
The pt  Was moved from pod a to pod c room 23.  Pt alert. ivs running

## 2011-12-09 NOTE — ED Notes (Signed)
The pt feel nauseated very jittery.  Med given and rocephin hung 2 gms iv

## 2011-12-09 NOTE — ED Notes (Signed)
Patient has returned from CT

## 2011-12-09 NOTE — ED Provider Notes (Signed)
History     CSN: [id number]  Arrival date & time 12/09/11  1146   First MD Initiated Contact with Patient 12/09/11 1505      Chief Complaint  Patient presents with  . Joint Swelling  . Alcohol Problem    (Consider location/radiation/quality/duration/timing/severity/associated sxs/prior treatment) HPI Comments: Patient presents requesting detox from alcohol and also bilateral lower extremity swelling and numbness. Patient seen 2 weeks ago for detox from alcohol. He reports drinking at least 1 pint of whiskey per day. He denies any other drug use. His lower extremity swelling, pain, and numbness started gradually about 3 months ago. Patient reports a "pins and needles" type pain in his lower extremities, affecting his lower legs and feet. He reports pain with palpation and weight bearing activity. He has not tried anything for the pain. He denies alleviating factors. He reports associated hand numbness, abdominal pain, nausea, chills, and shortness of breath. He denies fever, vomiting, diarrhea, head ache. Patient denies cardiac history.   Patient is a 46 y.o. male presenting with alcohol problem.  Alcohol Problem Associated symptoms include abdominal pain, arthralgias, chills, myalgias, nausea and numbness.    Past Medical History  Diagnosis Date  . Hypertension   . Hyperlipidemia   . 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 ETOH abuse   . GERD (gastroesophageal reflux disease)   . Mental disorder     Past Surgical History  Procedure Date  . Back surgery   . Neck surgery   . Fundoplasty transthoracic 2003  . Nasal sinus surgery     Family History  Problem Relation Age of Onset  . Breast cancer Paternal Grandmother   . Lung cancer      was a smoker  . Hypertension Mother     History  Substance Use Topics  . Smoking status: Current Every Day Smoker -- 1.0 packs/day for 30 years    Types: Cigarettes  . Smokeless tobacco: Never  Used  . Alcohol Use: Yes     s/p detox-had 2 beers last night      Review of Systems  Constitutional: Positive for chills.  Respiratory: Positive for shortness of breath.   Gastrointestinal: Positive for nausea and abdominal pain.  Musculoskeletal: Positive for myalgias and arthralgias.  Neurological: Positive for numbness.  All other systems reviewed and are negative.    Allergies  Lisinopril  Home Medications   Current Outpatient Rx  Name Route Sig Dispense Refill  . METHADONE HCL 10 MG PO TABS Oral Take 10-20 mg by mouth daily.       BP 158/101  Pulse 82  Temp 97.6 F (36.4 C) (Oral)  Resp 18  SpO2 98%  Physical Exam  Nursing note and vitals reviewed. Constitutional: He is oriented to person, place, and time. He appears well-developed and well-nourished. No distress.  HENT:  Head: Normocephalic and atraumatic.  Mouth/Throat: Oropharynx is clear and moist. No oropharyngeal exudate.  Eyes: Conjunctivae normal and EOM are normal. Pupils are equal, round, and reactive to light. No scleral icterus.       No scleral icterus noted.   Neck: Normal range of motion. Neck supple.  Cardiovascular: Normal rate and regular rhythm.  Exam reveals no gallop and no friction rub.   No murmur heard.      Pulses difficult to palpate of lower extremities due to edema. Radial pulses of bilateral wrists palpable. Bilateral lower extremity 2+ pitting edema.   Pulmonary/Chest: Effort  normal and breath sounds normal. No respiratory distress. He has no wheezes. He has no rales. He exhibits no tenderness.  Abdominal: Soft. He exhibits distension.       Moderate generalized abdominal tenderness to palpation.   Musculoskeletal:       Bilateral ankle ROM limited due to swelling and pain.   Neurological: He is alert and oriented to person, place, and time. Coordination normal.       Upper extremity grip strength equal and intact bilaterally. Distal sensation diminished bilaterally of hands and  feet.   Skin: Skin is warm and dry. He is not diaphoretic.  Psychiatric: He has a normal mood and affect. His behavior is normal.    ED Course  Procedures (including critical care time)  Labs Reviewed  ETHANOL - Abnormal; Notable for the following:    Alcohol, Ethyl (B) 270 (*)     All other components within normal limits  CBC WITH DIFFERENTIAL - Abnormal; Notable for the following:    Eosinophils Relative 12 (*)     Eosinophils Absolute 0.8 (*)     All other components within normal limits  COMPREHENSIVE METABOLIC PANEL - Abnormal; Notable for the following:    Glucose, Bld 119 (*)     AST 73 (*)     All other components within normal limits  SALICYLATE LEVEL - Abnormal; Notable for the following:    Salicylate Lvl <2.0 (*)     All other components within normal limits  CBC WITH DIFFERENTIAL - Abnormal; Notable for the following:    Eosinophils Relative 11 (*)     All other components within normal limits  D-DIMER, QUANTITATIVE - Abnormal; Notable for the following:    D-Dimer, Quant 0.80 (*)     All other components within normal limits  ACETAMINOPHEN LEVEL  URINE RAPID DRUG SCREEN (HOSP PERFORMED)  URIC ACID  BASIC METABOLIC PANEL  PRO B NATRIURETIC PEPTIDE   Dg Chest 2 View  12/09/2011  *RADIOLOGY REPORT*  Clinical Data: Chest pain, shortness of breath.  CHEST - 2 VIEW  Comparison: November 18, 2011.  Findings: Cardiomediastinal silhouette appears normal.  Left lung is clear.  Ill-defined density is again noted in the right lower lobe which is unchanged.  Bony thorax appears intact. Small rounded nodule is noted laterally in the right lung apex.  IMPRESSION: Continued presence of ill-defined density in the right lower lobe which is not significantly changed; this may represent scarring, atelectasis or possibly residual pneumonia.  Small nodular density is seen in lateral portion of right upper lobe; chest CT is recommended for further evaluation.   Original Report  Authenticated By: Venita Sheffield., M.D.    Ct Angio Chest Pe W/cm &/or Wo Cm  12/09/2011  *RADIOLOGY REPORT*  Clinical Data: Worsening shortness of breath with bilateral leg swelling.  CT ANGIOGRAPHY CHEST  Technique:  Multidetector CT imaging of the chest using the standard protocol during bolus administration of intravenous contrast. Multiplanar reconstructed images including MIPs were obtained and reviewed to evaluate the vascular anatomy.  Contrast: OMNIPAQUE IOHEXOL 350 MG/ML SOLN  Comparison: Chest CT from 09/21/2010.  Chest x-ray from 10/15/2011 and 11/18/2011.  Findings: No filling defect is identified within the opacified pulmonary arteries to suggest the presence of an acute pulmonary embolus.  There is no thoracic aortic aneurysm.  No dissection of the thoracic aorta.  There is no axillary, mediastinal, or left hilar lymphadenopathy. Borderline enlarged 9 mm lower right hilar lymph node is evident. The heart  size is normal.  There is no pericardial or pleural effusion.  Lung windows demonstrate upper lobe emphysema.  5.5 x 2.6 cm flame shaped area of airspace consolidation is identified in the right lower lobe, just posterior to the major fissure.  This was not present on a chest x-ray from 10/15/2011, but did appear to proximally 1 month later on the x-ray of 11/18/2011.  Previous described 4 mm left upper lobe pulmonary nodule is stable.  Bone windows reveal no worrisome lytic or sclerotic osseous lesions.  Images which include the upper abdomen show fatty changes in the liver.  The patient is status post fundoplication at the esophagogastric junction.  IMPRESSION: No CT evidence for acute pulmonary embolus.  5.5 x 2.6 cm flame shaped region of alveolar consolidation in the anterior right lower lobe.  This developed in a 80-month interval between two x-rays of 10/15/2011 and 11/18/2011.  CT today suggests some overlying retraction of the adjacent fissures. The rapid appearance suggests an  infectious or inflammatory etiology, but neoplasm can not be completely excluded.  Stable 4 mm left upper lobe pulmonary nodule since CT scan of 1 year ago. Consensus guidelines indicate no further imaging follow- up of this particular nodule is indicated.  This recommendation follows the consensus statement: Guidelines for Management of Small Pulmonary Nodules Detected on CT Scans:  A Statement from the Fleischner Society as published in Radiology 2005; 237:395-400.   .   Original Report Authenticated By: Ridge A. MANSELL, M.D.      1. Alcohol withdrawal   2. Community acquired pneumonia       MDM  5:04 PM Labs pending. Pro-BNP and d-dimer pending. Patient given fluids, ativan, zofran, and morphine for comfort. Patient will be medically cleared before moving forward with detox. Plan is to eliminate DVT and CHF. When labs result, patient will be re-evaluated.   7:58 PM Patient has an elevated D-dimer. Will order CTA to rule out PE. If no PE, patient can be seen by ACT.  Patient also has residual pneumonia shown on chest xray that will be treated here.    9:28 PM CT angiogram negative for PE. Reveals stable pulmonary nodule and residual pneumonia. I will consult ACT. Patient will have venous duplex in the morning to rule out DVT. Patient started on IV rocephin and azithromycin for CAP. Patient will be moved to Pod C and ACT will see. ED psych hold orders placed.     Emilia Beck, PA-C 12/10/11 0031

## 2011-12-09 NOTE — Progress Notes (Signed)
46 year old man with Hx of pneumonia on 11/18/2011, now presents seeking detox, has swelling of both legs. Exam shows him to have plethoric facies, Lungs clear, extremities with 2+ bilateral lower leg edema.  ETOH 270.  D dimer mildly elevated at 0.8.  Chest x-ray shows persistent RLL infiltrate, R upper lobe nodule.  CT angio of chest advised.  If has significant pneumonia or PE will need admission to treat those illnesses.

## 2011-12-09 NOTE — ED Notes (Signed)
The pt says he just does not feel very well

## 2011-12-09 NOTE — ED Notes (Signed)
Pt  Is  Currently getting his zithromax iv rocephin 2gm is due also

## 2011-12-09 NOTE — ED Notes (Signed)
Pt up to the br steady gait.  No complaints

## 2011-12-09 NOTE — ED Notes (Signed)
Patient transported to CT 

## 2011-12-09 NOTE — ED Notes (Signed)
Pt complains of bilateral knee swelling and numbness and also has hand numbness, sts needs help with etoh, last drink 3 hours ago.

## 2011-12-09 NOTE — ED Notes (Signed)
The pt is currently sleeping.  His zithromax is still infusing.

## 2011-12-10 DIAGNOSIS — M79609 Pain in unspecified limb: Secondary | ICD-10-CM

## 2011-12-10 DIAGNOSIS — M7989 Other specified soft tissue disorders: Secondary | ICD-10-CM

## 2011-12-10 DIAGNOSIS — R0602 Shortness of breath: Secondary | ICD-10-CM

## 2011-12-10 MED ORDER — DIAZEPAM 5 MG/ML IJ SOLN: 5.0000 mg | Freq: Once | INTRAMUSCULAR | Status: DC

## 2011-12-10 MED ORDER — LORAZEPAM 1 MG PO TABS
0.0000 mg | ORAL_TABLET | Freq: Three times a day (TID) | ORAL | Status: DC | PRN
Start: 2011-12-10 — End: 2011-12-11
  Administered 2011-12-10 (×4): 2 mg via ORAL
  Administered 2011-12-11: 1 mg via ORAL
  Filled 2011-12-10: qty 2
  Filled 2011-12-10 (×2): qty 1
  Filled 2011-12-10 (×3): qty 2

## 2011-12-10 MED ORDER — DIAZEPAM 5 MG PO TABS
5.0000 mg | ORAL_TABLET | Freq: Once | ORAL | Status: AC
  Administered 2011-12-10: 5 mg via ORAL
  Filled 2011-12-10: qty 1

## 2011-12-10 MED ORDER — METHADONE HCL 10 MG PO TABS
20.0000 mg | ORAL_TABLET | Freq: Every day | ORAL | Status: DC
  Administered 2011-12-10 – 2011-12-11 (×2): 20 mg via ORAL
  Filled 2011-12-10 (×2): qty 2

## 2011-12-10 NOTE — ED Notes (Signed)
Act in talking to the pt

## 2011-12-10 NOTE — BH Assessment (Signed)
Assessment Note   Mark [name] is an 46 y.o. male.  Pt came to [facility] seeking detox from whiskey.  He reports that he drinks "the better part of a fifth" daily.  He reports daily use at that rate for the last couple of years.  Patient last drank around midnight on 09/29 and had drank his usual amount during the preceeding 24 hours.  Patient complains of pins & needle sensation on feet and numbness.  The PA reported that Dr. Ignacia [name] [facility]) had put in a order for a doppler to be done of the feet in AM on 10/01 to address swelling of feet.  Patient denies SI, plan or intention.  He does report that he and wife are separated and he has moved in and out of the home twice over the last year.  Patient denies any HI or A/V hallucinations at this time.  Patient reports that he does take methadone which is precried by his PCP, Dr. Foy [name].  The methadone is 2 pills, 10mg  each per day.  This is to address chronic back and neck pain from spinal surgeries.  Patient is currently requesting detox from ETOH.   Axis I: 303.90 ETOH dependence Axis II: Deferred Axis III:  Past Medical History  Diagnosis Date  . Hypertension   . Hyperlipidemia   . 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 ETOH abuse   . GERD (gastroesophageal reflux disease)   . Mental disorder    Axis IV: economic problems, occupational problems, other psychosocial or environmental problems and problems with primary support group Axis V: 31-40 impairment in reality testing  Past Medical History:  Past Medical History  Diagnosis Date  . Hypertension   . Hyperlipidemia   . 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 ETOH abuse   . GERD (gastroesophageal reflux disease)   . Mental disorder     Past Surgical History  Procedure Date  . Back surgery   . Neck surgery   . Fundoplasty transthoracic 2003  . Nasal sinus surgery     Family History:  Family History    Problem Relation Age of Onset  . Breast cancer Paternal Grandmother   . Lung cancer      was a smoker  . Hypertension Mother     Social History:  reports that he has been smoking Cigarettes.  He has a 30 pack-year smoking history. He has never used smokeless tobacco. He reports that he drinks alcohol. He reports that he uses illicit drugs.  Additional Social History:  Alcohol / Drug Use Pain Medications: Methadone 10mg  2 pills daily Prescriptions: See medication reconcilliation list Over the Counter: See medication reconcilliation list History of alcohol / drug use?: Yes Longest period of sobriety (when/how long): 37YRS Negative Consequences of Use: Personal relationships;Financial Withdrawal Symptoms: Nausea / Vomiting;Tremors;Weakness;Fever / Chills Substance #1 Name of Substance 1: ETOH 1 - Age of First Use: 44 1 - Amount (size/oz): PINT 1 - Frequency: DAILY 1 - Duration: 2 YRS 1 - Last Use / Amount:  (12/08/11) Substance #2 Name of Substance 2: METHADONE 2 - Age of First Use: 40 2 - Amount (size/oz): 2(10MG  TAB) 2 - Frequency: DAILY 2 - Duration: ON GOING 2 - Last Use / Amount:  (12/09/11)  CIWA: CIWA-Ar BP: 162/108 mmHg (Taken at 20:00 on 09/30) Pulse Rate: 102  Nausea and Vomiting: mild nausea with no  vomiting Tactile Disturbances: moderate itching, pins and needles, burning or numbness Tremor: two Auditory Disturbances: not present Paroxysmal Sweats: three Visual Disturbances: very mild sensitivity Anxiety: two Headache, Fullness in Head: very mild Agitation: somewhat more than normal activity Orientation and Clouding of Sensorium: disoriented for date by more than 2 calendar days CIWA-Ar Total: 17  COWS:    Allergies:  Allergies  Allergen Reactions  . Lisinopril Anaphylaxis and Swelling    Home Medications:  (Not in a hospital admission)  OB/GYN Status:  No LMP for male patient.  General Assessment Data Location of Assessment: [facility] ED Living  Arrangements: Parent Can pt return to current living arrangement?: Yes Admission Status: Voluntary Is patient capable of signing voluntary admission?: Yes Transfer from: Acute Hospital Referral Source: MD     Risk to self Suicidal Ideation: No Suicidal Intent: No Is patient at risk for suicide?: No Suicidal Plan?: No Access to Means: No What has been your use of drugs/alcohol within the last 12 months?: Daily use of ETOH. Previous Attempts/Gestures: No How many times?: 0  Other Self Harm Risks: SA issues Triggers for Past Attempts: None known Intentional Self Injurious Behavior: None Family Suicide History: No Recent stressful life event(s): Other (Comment) (Separated from wife) Persecutory voices/beliefs?: No Depression: Yes Depression Symptoms: Feeling worthless/self pity Substance abuse history and/or treatment for substance abuse?: Yes Suicide prevention information given to non-admitted patients: Not applicable  Risk to Others Homicidal Ideation: No Thoughts of Harm to Others: No Current Homicidal Intent: No Current Homicidal Plan: No Access to Homicidal Means: No Identified Victim: No one History of harm to others?: No Assessment of Violence: None Noted Violent Behavior Description: Pt sleepy but cooperative Does patient have access to weapons?: No Criminal Charges Pending?: No Does patient have a court date: No  Psychosis Hallucinations: None noted Delusions: None noted  Mental Status Report Appear/Hygiene:  (Casual in scrubs) Eye Contact: Poor Motor Activity: Freedom of movement;Tremors Speech: Logical/coherent Level of Consciousness: Drowsy Mood: Depressed Affect: Sad Anxiety Level: Minimal Thought Processes: Relevant Judgement: Impaired Orientation: Person;Place;Situation Obsessive Compulsive Thoughts/Behaviors: None  Cognitive Functioning Concentration: Normal Memory: Recent Intact;Remote Intact IQ: Average Insight: Fair Impulse Control:  Poor Appetite: Fair Weight Loss: 0  Weight Gain: 0  Sleep: Decreased Total Hours of Sleep:  (<4H/D) Vegetative Symptoms: None  ADLScreening [facility] Assessment Services) Patient's cognitive ability adequate to safely complete daily activities?: Yes Patient able to express need for assistance with ADLs?: Yes Independently performs ADLs?: Yes (appropriate for developmental age)  Abuse/Neglect [facility]) Physical Abuse: Denies Verbal Abuse: Denies Sexual Abuse: Denies  Prior Inpatient Therapy Prior Inpatient Therapy: Yes Prior Therapy Dates: Pt unsure of when Prior Therapy Facilty/Provider(s): Pt says it was [facility] Reason for Treatment: detox  Prior Outpatient Therapy Prior Outpatient Therapy: No Prior Therapy Dates: N/A Prior Therapy Facilty/Provider(s): N/A Reason for Treatment: N/A  ADL Screening (condition at time of admission) Patient's cognitive ability adequate to safely complete daily activities?: Yes Patient able to express need for assistance with ADLs?: Yes Independently performs ADLs?: Yes (appropriate for developmental age) Weakness of Legs: Both (Reports numbness and tingling of both feet) Weakness of Arms/Hands: None  Home Assistive Devices/Equipment Home Assistive Devices/Equipment: None    Abuse/Neglect Assessment (Assessment to be complete while patient is alone) Physical Abuse: Denies Verbal Abuse: Denies Sexual Abuse: Denies Exploitation of patient/patient's resources: Denies Self-Neglect: Denies     Merchant navy officer (For Healthcare) Advance Directive: Patient has advance directive, copy not in chart Type of Advance Directive: Healthcare Power of [place] (  States his mother has healthcare POA)    Additional Information 1:1 In Past 12 Months?: No CIRT Risk: No Elopement Risk: No Does patient have medical clearance?: Yes     Disposition:  Disposition Disposition of Patient: Referred to Type of inpatient treatment program: Adult Patient referred  to:  [facility])  On Site Evaluation by:   Reviewed with Physician:  Dr. Arnoldo Morale & Emilia Beck, PA-C   Beatriz Stallion Ray 12/10/2011 1:10 AM

## 2011-12-10 NOTE — ED Notes (Signed)
The pt is  sleeping at present 

## 2011-12-10 NOTE — ED Provider Notes (Signed)
Medical screening examination/treatment/procedure(s) were conducted as a shared visit with non-physician practitioner(s) and myself.  I personally evaluated the patient during the encounter 46 year old man with Hx of pneumonia on 11/18/2011, now presents seeking detox, has swelling of both legs. Exam shows him to have plethoric facies, Lungs clear, extremities with 2+ bilateral lower leg edema. ETOH 270. D dimer mildly elevated at 0.8. Chest x-ray shows persistent RLL infiltrate, R upper lobe nodule. CT angio of chest advised. If has significant pneumonia or PE will need admission to treat those illnesses.       Carleene Cooper III, MD 12/10/11 1213

## 2011-12-10 NOTE — ED Notes (Signed)
Pt finished with breakfast tray, pt resting, watching tv; has no needs at this time

## 2011-12-10 NOTE — ED Notes (Signed)
Venous Doppler Technician has completed doppler scan

## 2011-12-10 NOTE — ED Provider Notes (Signed)
Filed Vitals:   12/09/11 2000 12/10/11 0010 12/10/11 0649 12/10/11 0650  BP: 162/108 162/108 140/80 140/80  Pulse: 102 102 98 98  Temp:   98.7 F (37.1 C)   TempSrc:   Oral   Resp: 15  16   SpO2: 95%  98%    Mark [name] is a 46 y.o. male requesting EtOH detox.  Pt is feeling shaky, mildly nauseous, and is having back pain and headache.  Pt says he takes 10-20mg  methadone for chronic pain in his back and thinks his HA may be caused from that.    Valium 5mg  po for WD sx and will restart his methadone. Waiting for doppler - under review at [facility], MD 12/10/11 [phone]

## 2011-12-10 NOTE — ED Notes (Signed)
Pt sleeping.  Iv kvo

## 2011-12-10 NOTE — ED Notes (Signed)
Patient continues to have withdrawal symptoms and states he has nausea w/o vomiting.

## 2011-12-10 NOTE — ED Notes (Signed)
Breakfast tray delivered

## 2011-12-10 NOTE — ED Notes (Signed)
Patient resting with NAD at this time. Breakfast tray at bedside.

## 2011-12-10 NOTE — Progress Notes (Signed)
8:53 PM Pt became more tremulous, so Ativan 2 mg IV was ordered.

## 2011-12-10 NOTE — Progress Notes (Signed)
Bilateral:  No evidence of DVT, superficial thrombosis, or Baker's Cyst.   

## 2011-12-10 NOTE — ED Notes (Signed)
Notified EDP Ignacia Palma of pt's recent CIWA score of 10.  Obtained verbal order to administer 2mg  of ativan.

## 2011-12-11 MED ORDER — VITAMIN B-1 100 MG PO TABS
100.0000 mg | ORAL_TABLET | Freq: Every day | ORAL | Status: DC
  Administered 2011-12-11: 100 mg via ORAL
  Filled 2011-12-11: qty 1

## 2011-12-11 MED ORDER — THIAMINE HCL 100 MG/ML IJ SOLN: 100.0000 mg | Freq: Every day | INTRAMUSCULAR | Status: DC

## 2011-12-11 MED ORDER — LORAZEPAM 1 MG PO TABS: 1.0000 mg | ORAL_TABLET | Freq: Four times a day (QID) | ORAL | Status: DC | PRN

## 2011-12-11 MED ORDER — ADULT MULTIVITAMIN W/MINERALS CH
1.0000 | ORAL_TABLET | Freq: Every day | ORAL | Status: DC
  Administered 2011-12-11: 1 via ORAL
  Filled 2011-12-11: qty 1

## 2011-12-11 MED ORDER — FOLIC ACID 1 MG PO TABS
1.0000 mg | ORAL_TABLET | Freq: Every day | ORAL | Status: DC
  Administered 2011-12-11: 1 mg via ORAL
  Filled 2011-12-11: qty 1

## 2011-12-11 MED ORDER — LORAZEPAM 1 MG PO TABS
0.0000 mg | ORAL_TABLET | Freq: Four times a day (QID) | ORAL | Status: DC
  Administered 2011-12-11: 1 mg via ORAL
  Filled 2011-12-11: qty 2

## 2011-12-11 MED ORDER — LORAZEPAM 1 MG PO TABS: 0.0000 mg | ORAL_TABLET | Freq: Two times a day (BID) | ORAL | Status: DC

## 2011-12-11 MED ORDER — LORAZEPAM 2 MG/ML IJ SOLN: 1.0000 mg | Freq: Four times a day (QID) | INTRAMUSCULAR | Status: DC | PRN

## 2011-12-11 MED ORDER — LORAZEPAM 1 MG PO TABS
2.0000 mg | ORAL_TABLET | Freq: Once | ORAL | Status: AC
  Administered 2011-12-11: 2 mg via ORAL
  Filled 2011-12-11: qty 2

## 2011-12-11 NOTE — ED Provider Notes (Signed)
Patient is doing well and is ready to go home. ACT confirmed that the patient can be discharged. Patient has no new complaints.   Filed Vitals:   12/11/11 1718  BP: 169/111  Pulse: 100  Temp: 98.3 F (36.8 C)  Resp: [address] PA-C 12/11/11 1903

## 2011-12-11 NOTE — ED Notes (Signed)
Pt. States "I feel a lot better as far as anxiety and tremors but I'm so uncomfortable, I have chronic back problems and this bed is very uncomfortable. I feel like if I could just get a good night sleep I would feel better". This nurse working on getting patient an inpatient bed rather than stretcher to help with comfort.

## 2011-12-11 NOTE — ED Notes (Signed)
Pt. Asleep. Will reassess when patient wakes up due to pt having inadequate sleep prior to now.

## 2011-12-11 NOTE — ED Provider Notes (Signed)
Medical screening examination/treatment/procedure(s) were performed by non-physician practitioner and as supervising physician I was immediately available for consultation/collaboration.   Ayme Short, MD 12/11/11 2345 

## 2011-12-11 NOTE — ED Provider Notes (Signed)
Pt states he is a little better this am.   Filed Vitals:   12/11/11 0504  BP: 129/73  Pulse: 86  Temp:   Resp:    Car RRR  Lungs CTA  Awaiting placement.  Celene Kras, MD 12/11/11 [phone]

## 2012-01-19 ENCOUNTER — Inpatient Hospital Stay (HOSPITAL_COMMUNITY)
Admission: EM | Admit: 2012-01-19 | Discharge: 2012-01-24 | DRG: 309 | Disposition: A | Discharge status: Home / Self Care | Payer: PRIVATE HEALTH INSURANCE | Attending: Internal Medicine | Admitting: Internal Medicine

## 2012-01-19 ENCOUNTER — Inpatient Hospital Stay (HOSPITAL_COMMUNITY): Payer: Self-pay

## 2012-01-19 ENCOUNTER — Emergency Department (HOSPITAL_COMMUNITY): Payer: Self-pay

## 2012-01-19 ENCOUNTER — Encounter (HOSPITAL_COMMUNITY): Payer: Self-pay | Admitting: Emergency Medicine

## 2012-01-19 DIAGNOSIS — F10939 Alcohol use, unspecified with withdrawal, unspecified: Secondary | ICD-10-CM

## 2012-01-19 DIAGNOSIS — F339 Major depressive disorder, recurrent, unspecified: Secondary | ICD-10-CM | POA: Diagnosis present

## 2012-01-19 DIAGNOSIS — I472 Ventricular tachycardia, unspecified: Principal | ICD-10-CM

## 2012-01-19 DIAGNOSIS — R0789 Other chest pain: Secondary | ICD-10-CM | POA: Diagnosis present

## 2012-01-19 DIAGNOSIS — F411 Generalized anxiety disorder: Secondary | ICD-10-CM | POA: Diagnosis present

## 2012-01-19 DIAGNOSIS — F39 Unspecified mood [affective] disorder: Secondary | ICD-10-CM | POA: Diagnosis present

## 2012-01-19 DIAGNOSIS — R1033 Periumbilical pain: Secondary | ICD-10-CM | POA: Diagnosis present

## 2012-01-19 DIAGNOSIS — F1020 Alcohol dependence, uncomplicated: Secondary | ICD-10-CM | POA: Diagnosis present

## 2012-01-19 DIAGNOSIS — Z91199 Patient's noncompliance with other medical treatment and regimen due to unspecified reason: Secondary | ICD-10-CM

## 2012-01-19 DIAGNOSIS — Z9119 Patient's noncompliance with other medical treatment and regimen: Secondary | ICD-10-CM

## 2012-01-19 DIAGNOSIS — M549 Dorsalgia, unspecified: Secondary | ICD-10-CM | POA: Diagnosis present

## 2012-01-19 DIAGNOSIS — I4729 Other ventricular tachycardia: Principal | ICD-10-CM | POA: Diagnosis present

## 2012-01-19 DIAGNOSIS — I519 Heart disease, unspecified: Secondary | ICD-10-CM | POA: Diagnosis present

## 2012-01-19 DIAGNOSIS — Z720 Tobacco use: Secondary | ICD-10-CM

## 2012-01-19 DIAGNOSIS — F1120 Opioid dependence, uncomplicated: Secondary | ICD-10-CM | POA: Diagnosis present

## 2012-01-19 DIAGNOSIS — R7401 Elevation of levels of liver transaminase levels: Secondary | ICD-10-CM | POA: Diagnosis present

## 2012-01-19 DIAGNOSIS — I10 Essential (primary) hypertension: Secondary | ICD-10-CM

## 2012-01-19 DIAGNOSIS — F10239 Alcohol dependence with withdrawal, unspecified: Secondary | ICD-10-CM

## 2012-01-19 DIAGNOSIS — R079 Chest pain, unspecified: Secondary | ICD-10-CM

## 2012-01-19 DIAGNOSIS — R109 Unspecified abdominal pain: Secondary | ICD-10-CM | POA: Diagnosis present

## 2012-01-19 DIAGNOSIS — F17200 Nicotine dependence, unspecified, uncomplicated: Secondary | ICD-10-CM | POA: Diagnosis present

## 2012-01-19 DIAGNOSIS — I4949 Other premature depolarization: Secondary | ICD-10-CM | POA: Diagnosis present

## 2012-01-19 DIAGNOSIS — E785 Hyperlipidemia, unspecified: Secondary | ICD-10-CM

## 2012-01-19 DIAGNOSIS — F1010 Alcohol abuse, uncomplicated: Secondary | ICD-10-CM

## 2012-01-19 DIAGNOSIS — F0630 Mood disorder due to known physiological condition, unspecified: Secondary | ICD-10-CM | POA: Diagnosis present

## 2012-01-19 DIAGNOSIS — Z888 Allergy status to other drugs, medicaments and biological substances status: Secondary | ICD-10-CM

## 2012-01-19 DIAGNOSIS — Z77120 Contact with and (suspected) exposure to mold (toxic): Secondary | ICD-10-CM

## 2012-01-19 DIAGNOSIS — R7402 Elevation of levels of lactic acid dehydrogenase (LDH): Secondary | ICD-10-CM | POA: Diagnosis present

## 2012-01-19 DIAGNOSIS — K700 Alcoholic fatty liver: Secondary | ICD-10-CM | POA: Diagnosis present

## 2012-01-19 DIAGNOSIS — K219 Gastro-esophageal reflux disease without esophagitis: Secondary | ICD-10-CM | POA: Diagnosis present

## 2012-01-19 DIAGNOSIS — E876 Hypokalemia: Secondary | ICD-10-CM | POA: Diagnosis present

## 2012-01-19 DIAGNOSIS — G8929 Other chronic pain: Secondary | ICD-10-CM | POA: Diagnosis present

## 2012-01-19 DIAGNOSIS — R0602 Shortness of breath: Secondary | ICD-10-CM

## 2012-01-19 DIAGNOSIS — G722 Myopathy due to other toxic agents: Secondary | ICD-10-CM | POA: Diagnosis present

## 2012-01-19 LAB — CBC
MCH: 32.4 pg (ref 26.0–34.0)
MCHC: 35.2 g/dL (ref 30.0–36.0)
Platelets: 154 10*3/uL (ref 150–400)
RDW: 13.1 % (ref 11.5–15.5)

## 2012-01-19 LAB — LIPASE, BLOOD: Lipase: 30 U/L (ref 11–59)

## 2012-01-19 LAB — BASIC METABOLIC PANEL
Calcium: 9.6 mg/dL (ref 8.4–10.5)
GFR calc Af Amer: >90 mL/min (ref >90)
GFR calc non Af Amer: >90 mL/min (ref >90)
Glucose, Bld: 130 mg/dL — ABNORMAL HIGH (ref 70–99)
Potassium: 3.1 mEq/L — ABNORMAL LOW (ref 3.5–5.1)
Sodium: 139 mEq/L (ref 135–145)

## 2012-01-19 LAB — HEPATIC FUNCTION PANEL
AST: 78 U/L — ABNORMAL HIGH (ref 0–37)
Albumin: 4.4 g/dL (ref 3.5–5.2)
Bilirubin, Direct: 0.1 mg/dL (ref 0.0–0.3)
Total Bilirubin: 0.4 mg/dL (ref 0.3–1.2)

## 2012-01-19 LAB — POCT I-STAT TROPONIN I: Troponin i, poc: 0.02 ng/mL (ref 0.00–0.08)

## 2012-01-19 LAB — RAPID URINE DRUG SCREEN, HOSP PERFORMED
Cocaine: NOT DETECTED
Opiates: NOT DETECTED
Tetrahydrocannabinol: NOT DETECTED

## 2012-01-19 LAB — ETHANOL: Alcohol, Ethyl (B): 226 mg/dL — ABNORMAL HIGH (ref 0–11)

## 2012-01-19 LAB — CK TOTAL AND CKMB (NOT AT ARMC)
CK, MB: 2.4 ng/mL (ref 0.3–4.0)
Relative Index: 1.6 (ref 0.0–2.5)
Total CK: 152 U/L (ref 7–232)

## 2012-01-19 LAB — PRO B NATRIURETIC PEPTIDE: Pro B Natriuretic peptide (BNP): 135.3 pg/mL — ABNORMAL HIGH (ref 0–125)

## 2012-01-19 LAB — TROPONIN I: Troponin I: <0.3 ng/mL (ref <0.30)

## 2012-01-19 LAB — MAGNESIUM: Magnesium: 2 mg/dL (ref 1.5–2.5)

## 2012-01-19 MED ORDER — POTASSIUM CHLORIDE 10 MEQ/100ML IV SOLN
10.0000 meq | Freq: Once | INTRAVENOUS | Status: AC
  Administered 2012-01-19: 10 meq via INTRAVENOUS
  Filled 2012-01-19: qty 100

## 2012-01-19 MED ORDER — AMIODARONE IV BOLUS ONLY 150 MG/100ML
INTRAVENOUS | Status: AC
  Administered 2012-01-19: 150 mg
  Filled 2012-01-19: qty 100

## 2012-01-19 MED ORDER — POTASSIUM CHLORIDE CRYS ER 20 MEQ PO TBCR
EXTENDED_RELEASE_TABLET | ORAL | Status: AC
  Filled 2012-01-19: qty 2

## 2012-01-19 MED ORDER — METOPROLOL TARTRATE 1 MG/ML IV SOLN
INTRAVENOUS | Status: AC
  Administered 2012-01-19: 5 mg
  Filled 2012-01-19: qty 5

## 2012-01-19 MED ORDER — ASPIRIN 325 MG PO TABS: 325.0000 mg | ORAL_TABLET | ORAL | Status: DC

## 2012-01-19 MED ORDER — METOPROLOL TARTRATE 25 MG PO TABS
25.0000 mg | ORAL_TABLET | Freq: Two times a day (BID) | ORAL | Status: DC
  Administered 2012-01-19 – 2012-01-23 (×7): 25 mg via ORAL
  Filled 2012-01-19 (×9): qty 1

## 2012-01-19 MED ORDER — AMIODARONE HCL IN DEXTROSE 360-4.14 MG/200ML-% IV SOLN
60.0000 mg/h | INTRAVENOUS | Status: DC
  Administered 2012-01-19 – 2012-01-20 (×2): 60 mg/h via INTRAVENOUS
  Filled 2012-01-19 (×4): qty 200

## 2012-01-19 MED ORDER — VITAMIN B-1 100 MG PO TABS
100.0000 mg | ORAL_TABLET | Freq: Every day | ORAL | Status: DC
  Administered 2012-01-19: 100 mg via ORAL
  Filled 2012-01-19: qty 1

## 2012-01-19 MED ORDER — LORAZEPAM 1 MG PO TABS
1.0000 mg | ORAL_TABLET | Freq: Four times a day (QID) | ORAL | Status: DC | PRN
  Administered 2012-01-19: 1 mg via ORAL
  Filled 2012-01-19: qty 1

## 2012-01-19 MED ORDER — NITROGLYCERIN 0.4 MG SL SUBL
SUBLINGUAL_TABLET | SUBLINGUAL | Status: AC
  Filled 2012-01-19: qty 25

## 2012-01-19 MED ORDER — ASPIRIN 81 MG PO CHEW
CHEWABLE_TABLET | ORAL | Status: AC
  Administered 2012-01-19: 324 mg
  Filled 2012-01-19: qty 4

## 2012-01-19 MED ORDER — AMIODARONE HCL IN DEXTROSE 360-4.14 MG/200ML-% IV SOLN
0.5000 mg/min | INTRAVENOUS | Status: DC
  Filled 2012-01-19: qty 200

## 2012-01-19 MED ORDER — LORAZEPAM 2 MG/ML IJ SOLN
1.0000 mg | Freq: Four times a day (QID) | INTRAMUSCULAR | Status: DC | PRN
  Filled 2012-01-19: qty 1

## 2012-01-19 MED ORDER — ADULT MULTIVITAMIN W/MINERALS CH
1.0000 | ORAL_TABLET | Freq: Every day | ORAL | Status: DC
  Administered 2012-01-19 – 2012-01-24 (×5): 1 via ORAL
  Filled 2012-01-19 (×6): qty 1

## 2012-01-19 MED ORDER — AMIODARONE LOAD VIA INFUSION
150.0000 mg | Freq: Once | INTRAVENOUS | Status: DC
  Filled 2012-01-19: qty 83.34

## 2012-01-19 MED ORDER — AMIODARONE HCL IN DEXTROSE 360-4.14 MG/200ML-% IV SOLN: 1.0000 mg/min | INTRAVENOUS | Status: AC

## 2012-01-19 MED ORDER — NITROGLYCERIN 0.4 MG SL SUBL
0.4000 mg | SUBLINGUAL_TABLET | SUBLINGUAL | Status: DC | PRN
  Administered 2012-01-19: 0.4 mg via SUBLINGUAL
  Administered 2012-01-19: 19:00:00 via SUBLINGUAL

## 2012-01-19 MED ORDER — POTASSIUM CHLORIDE CRYS ER 20 MEQ PO TBCR
40.0000 meq | EXTENDED_RELEASE_TABLET | Freq: Once | ORAL | Status: AC
  Administered 2012-01-19: 40 meq via ORAL

## 2012-01-19 MED ORDER — MORPHINE SULFATE 4 MG/ML IJ SOLN
6.0000 mg | Freq: Once | INTRAMUSCULAR | Status: AC
  Administered 2012-01-19: 6 mg via INTRAVENOUS
  Filled 2012-01-19: qty 2

## 2012-01-19 MED ORDER — FOLIC ACID 1 MG PO TABS
1.0000 mg | ORAL_TABLET | Freq: Every day | ORAL | Status: DC
  Administered 2012-01-19 – 2012-01-24 (×6): 1 mg via ORAL
  Filled 2012-01-19 (×6): qty 1

## 2012-01-19 MED ORDER — THIAMINE HCL 100 MG/ML IJ SOLN: 100.0000 mg | Freq: Every day | INTRAMUSCULAR | Status: DC

## 2012-01-19 NOTE — Consult Note (Signed)
Reason for Consult: Chest pain/nonsustained ventricular tachycardia Referring Physician: [facility] hospitalist  Mark [name] is an 46 y.o. male.  HPI: Patient is 46 year old male with past medical history significant for hypertension hypocholesteremia GERD, alcohol abuse tobacco abuse came to the ER complaining of vague left-sided chest pain off and on associated with palpitation and shortness of breath and generalized weakness since early this morning. Patient states his palpitations got worst today so decided to come to the ER. Cardiologic consultation was obtained as patient was noted to have recurrent episodes of nonsustained VT. Patient states he has been having palpitation off and on for one year today had frequent episodes of palpitation associated with dizziness lightheadedness and generalized weakness were decided to come to the ER. Patient denies any cardiac workup in the past states he has been drinking about 1 pint of whiskey every day for more than 5-6 years and smokes one to 2 packs per day for 30+ years. Denies any drug abuse. Patient had occasional sustained episodes of V. tach received 2 boluses of 150 IV amiodarone and 5 mg of IV Lopressor with improvement in his VT and no occasional couplets and bigeminy. Patient denies any history of exertional angina. Denies any PND orthopnea but complains of leg swelling for last few days.  Past Medical History  Diagnosis Date  . Hypertension   . Hyperlipidemia   . 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 ETOH abuse   . GERD (gastroesophageal reflux disease)   . Mental disorder     Past Surgical History  Procedure Date  . Back surgery   . Neck surgery   . Fundoplasty transthoracic 2003  . Nasal sinus surgery     Family History  Problem Relation Age of Onset  . Breast cancer Paternal Grandmother   . Lung cancer      was a smoker  . Hypertension Mother     Social History:  reports that he has  been smoking Cigarettes.  He has a 30 pack-year smoking history. He has never used smokeless tobacco. He reports that he drinks alcohol. He reports that he uses illicit drugs.  Allergies:  Allergies  Allergen Reactions  . Lisinopril Anaphylaxis and Swelling    Medications: I have reviewed the patient's current medications.  Results for orders placed during the hospital encounter of 01/19/12 (from the past 48 hour(s))  CBC     Status: Normal   Collection Time   01/19/12  7:05 PM      Component Value Range Comment   WBC 4.3  4.0 - 10.5 K/uL    RBC 4.88  4.22 - 5.81 MIL/uL    Hemoglobin 15.8  13.0 - 17.0 g/dL    HCT 16.1  09.6 - 04.5 %    MCV 92.0  78.0 - 100.0 fL    MCH 32.4  26.0 - 34.0 pg    MCHC 35.2  30.0 - 36.0 g/dL    RDW 40.9  81.1 - 91.4 %    Platelets 154  150 - 400 K/uL   BASIC METABOLIC PANEL     Status: Abnormal   Collection Time   01/19/12  7:05 PM      Component Value Range Comment   Sodium 139  135 - 145 mEq/L    Potassium 3.1 (*) 3.5 - 5.1 mEq/L    Chloride 96  96 - 112 mEq/L    CO2 28  19 - 32 mEq/L  Glucose, Bld 130 (*) 70 - 99 mg/dL    BUN 8  6 - 23 mg/dL    Creatinine, Ser 1.61  0.50 - 1.35 mg/dL    Calcium 9.6  8.4 - 09.6 mg/dL    GFR calc non Af Amer >90  >90 mL/min    GFR calc Af Amer >90  >90 mL/min   PRO B NATRIURETIC PEPTIDE     Status: Abnormal   Collection Time   01/19/12  7:05 PM      Component Value Range Comment   Pro B Natriuretic peptide (BNP) 135.3 (*) 0 - 125 pg/mL   ETHANOL     Status: Abnormal   Collection Time   01/19/12  7:05 PM      Component Value Range Comment   Alcohol, Ethyl (B) 226 (*) 0 - 11 mg/dL   HEPATIC FUNCTION PANEL     Status: Abnormal   Collection Time   01/19/12  7:05 PM      Component Value Range Comment   Total Protein 7.5  6.0 - 8.3 g/dL    Albumin 4.4  3.5 - 5.2 g/dL    AST 78 (*) 0 - 37 U/L    ALT 54 (*) 0 - 53 U/L    Alkaline Phosphatase 98  39 - 117 U/L    Total Bilirubin 0.4  0.3 - 1.2 mg/dL     Bilirubin, Direct 0.1  0.0 - 0.3 mg/dL    Indirect Bilirubin 0.3  0.3 - 0.9 mg/dL   MAGNESIUM     Status: Normal   Collection Time   01/19/12  7:05 PM      Component Value Range Comment   Magnesium 2.0  1.5 - 2.5 mg/dL   OCCULT BLOOD, POC DEVICE     Status: Normal   Collection Time   01/19/12  7:51 PM      Component Value Range Comment   Fecal Occult Bld NEGATIVE     POCT I-STAT TROPONIN I     Status: Normal   Collection Time   01/19/12  7:51 PM      Component Value Range Comment   Troponin i, poc 0.02  0.00 - 0.08 ng/mL    Comment 3            URINE RAPID DRUG SCREEN (HOSP PERFORMED)     Status: Normal   Collection Time   01/19/12  8:03 PM      Component Value Range Comment   Opiates NONE DETECTED  NONE DETECTED    Cocaine NONE DETECTED  NONE DETECTED    Benzodiazepines NONE DETECTED  NONE DETECTED    Amphetamines NONE DETECTED  NONE DETECTED    Tetrahydrocannabinol NONE DETECTED  NONE DETECTED    Barbiturates NONE DETECTED  NONE DETECTED     Dg Chest Port 1 View  01/19/2012  *RADIOLOGY REPORT*  Clinical Data: 46 year old male tachycardia.  PORTABLE CHEST - 1 VIEW  Comparison: Chest CTA 12/09/2011 and earlier.  Findings: Two portable AP views of the chest 1935 hours. Resuscitation pad in place.  Stable lung volumes.  Stable cardiac size and mediastinal contours.  Cervical ACDF hardware re- identified.  No pneumothorax or pleural effusion evident.  No pulmonary edema or confluent pulmonary opacity.  IMPRESSION: No acute cardiopulmonary abnormality.   Original Report Authenticated By: Erskine Speed, M.D.     Review of Systems  Constitutional: Negative for fever and chills.  Respiratory: Positive for shortness of breath. Negative for cough, hemoptysis and  sputum production.   Cardiovascular: Positive for chest pain, palpitations and leg swelling. Negative for orthopnea and claudication.  Gastrointestinal: Positive for abdominal pain. Negative for heartburn, nausea and vomiting.    Genitourinary: Negative for dysuria.  Neurological: Positive for dizziness.   Blood pressure 148/73, pulse 115, temperature 98.7 F (37.1 C), temperature source Oral, resp. rate 14, SpO2 99.00%. Physical Exam  Constitutional: He is oriented to person, place, and time. He appears well-developed and well-nourished.  HENT:  Head: Normocephalic and atraumatic.  Left Ear: External ear normal.  Nose: Nose normal.  Mouth/Throat: No oropharyngeal exudate.  Eyes: Conjunctivae normal are normal. Pupils are equal, round, and reactive to light. Left eye exhibits no discharge. No scleral icterus.  Neck: Normal range of motion. Neck supple. No JVD present. No tracheal deviation present. No thyromegaly present.  Cardiovascular:       Regular rate tachycardic frequent nonsustained VT and bigeminy and trigeminy noted on the monitor S1 and S2 soft soft systolic murmur noted  Respiratory: Effort normal and breath sounds normal. No respiratory distress. He has no wheezes. He has no rales.  GI: Soft. Bowel sounds are normal. He exhibits no distension. There is tenderness. There is no rebound.  Musculoskeletal:       No clubbing cyanosis 2+ edema noted  Neurological: He is alert and oriented to person, place, and time.    Assessment/Plan: Atypical chest pain rule out MI Status post recurrent nonsustained VT rule out ischemia Hypokalemia Hypertension GERD Alcohol abuse Tobacco abuse Hypercholesteremia Plan Check serial enzymes and EKG Amiodarone IV bolus given and drip per pharmacy as per orders Check 2-D echo Replace K. Will discuss with patient regarding noninvasive stress testing versus cardiac catheterization once sober  Monitor for DTs  Kissy Cielo N 01/19/2012, 9:07 PM

## 2012-01-19 NOTE — ED Notes (Signed)
Pt again having frequent and more sustained runs of VTach. Cardiologist at bedside. Amiodarone bolus given x 2. Pt converted to NSR  occassional PVCs.

## 2012-01-19 NOTE — ED Provider Notes (Signed)
History     CSN: [id number]  Arrival date & time 01/19/12  4098   First MD Initiated Contact with Patient 01/19/12 1936      Chief Complaint  Patient presents with  . Palpitations  . Alcohol Intoxication  . Facial Swelling    (Consider location/radiation/quality/duration/timing/severity/associated sxs/prior treatment) HPI Complains of anterior chest pain nonradiating onset mid morning today constant, worse with deep breathing.. Patient also requesting detox from alcohol. Drinks one fifth of liquor per day. Last alcohol earlier today. Patient also reports swelling of my entire body" and occasional skipped heart beats for several days. No treatment prior to coming here. Patient was treated with sublingual nitroglycerin per nursing protocol with minimal transient relief of chest discomfort. Past Medical History  Diagnosis Date  . Hypertension   . Hyperlipidemia   . 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 ETOH abuse   . GERD (gastroesophageal reflux disease)   . Mental disorder     Past Surgical History  Procedure Date  . Back surgery   . Neck surgery   . Fundoplasty transthoracic 2003  . Nasal sinus surgery     Family History  Problem Relation Age of Onset  . Breast cancer Paternal Grandmother   . Lung cancer      was a smoker  . Hypertension Mother     History  Substance Use Topics  . Smoking status: Current Every Day Smoker -- 1.0 packs/day for 30 years    Types: Cigarettes  . Smokeless tobacco: Never Used  . Alcohol Use: Yes     Comment: s/p detox-had 2 beers last night   Denies illicit drug   Review of Systems  Cardiovascular: Positive for chest pain and palpitations.       Swelling of "my entire body"  All other systems reviewed and are negative.    Allergies  Lisinopril  Home Medications   Current Outpatient Rx  Name  Route  Sig  Dispense  Refill  . METHADONE HCL 10 MG PO TABS   Oral   Take 10-20 mg by  mouth daily.            BP 147/92  Pulse 117  Temp 98.7 F (37.1 C) (Oral)  Resp 18  SpO2 95%  Physical Exam  Nursing note and vitals reviewed. Constitutional: He is oriented to person, place, and time. He appears well-developed and well-nourished.       Odor of alcohol on breath  HENT:  Head: Normocephalic and atraumatic.  Eyes: Conjunctivae normal are normal. Pupils are equal, round, and reactive to light.  Neck: Neck supple. No tracheal deviation present. No thyromegaly present.  Cardiovascular: Normal rate.   No murmur heard.      Irregular, tachycardic  Pulmonary/Chest: Effort normal and breath sounds normal.  Abdominal: Soft. Bowel sounds are normal. He exhibits no distension. There is no tenderness.  Genitourinary: Penis normal.  Musculoskeletal: Normal range of motion. He exhibits no edema and no tenderness.  Neurological: He is alert and oriented to person, place, and time. Coordination normal.  Skin: Skin is warm and dry. No rash noted.  Psychiatric: He has a normal mood and affect.    ED Course  Procedures (including critical care time)  Date: 01/19/2012  Rate: 95  Rhythm: normal sinus rhythm  QRS Axis: normal  Intervals: normal  ST/T Wave abnormalities: nonspecific T wave changes  Conduction Disutrbances:none  Narrative Interpretation:   Old EKG Reviewed:  PVCs new compared to her 11/18/2011 otherwise unchanged turbid by me Correction interpreted by me   Patient had multiple runs of nonsustained ventricular tachycardia lasting 8-12beats  8:50 PM patient's continues to complain of anterior chest pain after treatment with intravenous morphine. He remains alert and awake. Dr.Harwani, cardiologist consulted by me and is here to evaluate patient for ventricular tachycardia  Hospitalist physician call to evaluate patient for admission  Labs Reviewed  CBC  BASIC METABOLIC PANEL  PRO B NATRIURETIC PEPTIDE  ETHANOL   No results found.   No diagnosis  found.    MDM  Patient be watched acute alcohol withdrawal. Plan admit step down unit Diagnosis #1 chest pain #2 ventricular tachycardia #3 hypokalemia #4 acute alcohol intoxication #5 chronic alcohol abuse CRITICAL CARE Performed by: Doug Sou   Total critical care time: 30 miute  Critical care time was exclusive of separately billable procedures and treating other patients.  Critical care was necessary to treat or prevent imminent or life-threatening deterioration.  Critical care was time spent personally by me on the following activities: development of treatment plan with patient and/or surrogate as well as nursing, discussions with consultants, evaluation of patient's response to treatment, examination of patient, obtaining history from patient or surrogate, ordering and performing treatments and interventions, ordering and review of laboratory studies, ordering and review of radiographic studies, pulse oximetry and re-evaluation of patient's condition.        Doug Sou, MD 01/19/12 2104

## 2012-01-19 NOTE — ED Notes (Signed)
Patient came in for Detox, however his legs and lower body is extremely swollen per the patient. The patient also has swelling to his face and trouble with urination. The patient reports that he feels like his chest is pounding out of his chest

## 2012-01-19 NOTE — H&P (Signed)
Mark [name] is an 46 y.o. male.  Patient was seen and examined on January 19, 2012. PCP - Dr. Marinda Elk.  Chief Complaint: Chest pain and palpitations. HPI: 46 year-old male with previous history of hypertension and hyperlipidemia presently not taking medications as patient himself discontinued, history of tobacco abuse and alcoholism presents with complaints of chest pain and palpitations. Patient's chest pain has been there for long time but it worsened today. The pain is more left anterior chest wall pressure-like with mild shortness of breath. With the patient also had a brief sensation of palpitations off and on. In the ER he was found to be having frequent runs of PVCs and NSVT. Cardiac enzymes chest x-ray and EKG were unremarkable except for the PVCs. Cardiologist Dr. Sharyn Lull was consulted and cardiologist as her amiodarone drip at this time. Patient will be admitted for further management. Presently chest pain-free. Denies using any illegal drugs. He does use methadone for chronic back pain. In addition patient has been complaining of periumbilical pain. He states he's been having the periumbilical pain for last few days. Denies any nausea vomiting or diarrhea.  Past Medical History  Diagnosis Date  . Hypertension   . Hyperlipidemia   . 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 ETOH abuse   . GERD (gastroesophageal reflux disease)   . Mental disorder     Past Surgical History  Procedure Date  . Back surgery   . Neck surgery   . Fundoplasty transthoracic 2003  . Nasal sinus surgery     Family History  Problem Relation Age of Onset  . Breast cancer Paternal Grandmother   . Lung cancer      was a smoker  . Hypertension Mother    Social History:  reports that he has been smoking Cigarettes.  He has a 30 pack-year smoking history. He has never used smokeless tobacco. He reports that he drinks alcohol. He reports that he uses illicit  drugs.  Allergies:  Allergies  Allergen Reactions  . Lisinopril Anaphylaxis and Swelling     (Not in a hospital admission)  Results for orders placed during the hospital encounter of 01/19/12 (from the past 48 hour(s))  CBC     Status: Normal   Collection Time   01/19/12  7:05 PM      Component Value Range Comment   WBC 4.3  4.0 - 10.5 K/uL    RBC 4.88  4.22 - 5.81 MIL/uL    Hemoglobin 15.8  13.0 - 17.0 g/dL    HCT 16.1  09.6 - 04.5 %    MCV 92.0  78.0 - 100.0 fL    MCH 32.4  26.0 - 34.0 pg    MCHC 35.2  30.0 - 36.0 g/dL    RDW 40.9  81.1 - 91.4 %    Platelets 154  150 - 400 K/uL   BASIC METABOLIC PANEL     Status: Abnormal   Collection Time   01/19/12  7:05 PM      Component Value Range Comment   Sodium 139  135 - 145 mEq/L    Potassium 3.1 (*) 3.5 - 5.1 mEq/L    Chloride 96  96 - 112 mEq/L    CO2 28  19 - 32 mEq/L    Glucose, Bld 130 (*) 70 - 99 mg/dL    BUN 8  6 - 23 mg/dL    Creatinine, Ser 7.82  0.50 -  1.35 mg/dL    Calcium 9.6  8.4 - 96.0 mg/dL    GFR calc non Af Amer >90  >90 mL/min    GFR calc Af Amer >90  >90 mL/min   PRO B NATRIURETIC PEPTIDE     Status: Abnormal   Collection Time   01/19/12  7:05 PM      Component Value Range Comment   Pro B Natriuretic peptide (BNP) 135.3 (*) 0 - 125 pg/mL   ETHANOL     Status: Abnormal   Collection Time   01/19/12  7:05 PM      Component Value Range Comment   Alcohol, Ethyl (B) 226 (*) 0 - 11 mg/dL   HEPATIC FUNCTION PANEL     Status: Abnormal   Collection Time   01/19/12  7:05 PM      Component Value Range Comment   Total Protein 7.5  6.0 - 8.3 g/dL    Albumin 4.4  3.5 - 5.2 g/dL    AST 78 (*) 0 - 37 U/L    ALT 54 (*) 0 - 53 U/L    Alkaline Phosphatase 98  39 - 117 U/L    Total Bilirubin 0.4  0.3 - 1.2 mg/dL    Bilirubin, Direct 0.1  0.0 - 0.3 mg/dL    Indirect Bilirubin 0.3  0.3 - 0.9 mg/dL   MAGNESIUM     Status: Normal   Collection Time   01/19/12  7:05 PM      Component Value Range Comment   Magnesium  2.0  1.5 - 2.5 mg/dL   OCCULT BLOOD, POC DEVICE     Status: Normal   Collection Time   01/19/12  7:51 PM      Component Value Range Comment   Fecal Occult Bld NEGATIVE     POCT I-STAT TROPONIN I     Status: Normal   Collection Time   01/19/12  7:51 PM      Component Value Range Comment   Troponin i, poc 0.02  0.00 - 0.08 ng/mL    Comment 3            URINE RAPID DRUG SCREEN (HOSP PERFORMED)     Status: Normal   Collection Time   01/19/12  8:03 PM      Component Value Range Comment   Opiates NONE DETECTED  NONE DETECTED    Cocaine NONE DETECTED  NONE DETECTED    Benzodiazepines NONE DETECTED  NONE DETECTED    Amphetamines NONE DETECTED  NONE DETECTED    Tetrahydrocannabinol NONE DETECTED  NONE DETECTED    Barbiturates NONE DETECTED  NONE DETECTED   CK TOTAL AND CKMB     Status: Normal   Collection Time   01/19/12  9:46 PM      Component Value Range Comment   Total CK 152  7 - 232 U/L    CK, MB 2.4  0.3 - 4.0 ng/mL    Relative Index 1.6  0.0 - 2.5   TROPONIN I     Status: Normal   Collection Time   01/19/12  9:46 PM      Component Value Range Comment   Troponin I <0.30  <0.30 ng/mL   LIPASE, BLOOD     Status: Normal   Collection Time   01/19/12  9:46 PM      Component Value Range Comment   Lipase 30  11 - 59 U/L    Dg Abd 1 View  01/19/2012  *RADIOLOGY REPORT*  Clinical Data: 46 year old male with pain.  ABDOMEN - 1 VIEW  Comparison: 01/18/2005.  Findings: Supine AP portable view 2216 hours. Nonobstructed bowel gas pattern.  Resuscitation pad projects over the epigastric region.  Chronic lower spinal fusion changes.  No definite pneumoperitoneum.  IMPRESSION: Nonobstructed bowel gas pattern.   Original Report Authenticated By: Erskine Speed, M.D.    Dg Chest Port 1 View  01/19/2012  *RADIOLOGY REPORT*  Clinical Data: 46 year old male tachycardia.  PORTABLE CHEST - 1 VIEW  Comparison: Chest CTA 12/09/2011 and earlier.  Findings: Two portable AP views of the chest 1935 hours.  Resuscitation pad in place.  Stable lung volumes.  Stable cardiac size and mediastinal contours.  Cervical ACDF hardware re- identified.  No pneumothorax or pleural effusion evident.  No pulmonary edema or confluent pulmonary opacity.  IMPRESSION: No acute cardiopulmonary abnormality.   Original Report Authenticated By: Erskine Speed, M.D.     Review of Systems  Constitutional: Negative.   HENT: Negative.   Eyes: Negative.   Respiratory: Negative.   Cardiovascular: Positive for chest pain and palpitations.  Gastrointestinal: Positive for abdominal pain.  Genitourinary: Negative.   Musculoskeletal: Negative.   Skin: Negative.   Neurological: Negative.   Endo/Heme/Allergies: Negative.   Psychiatric/Behavioral: Negative.     Blood pressure 156/82, pulse 100, temperature 98.7 F (37.1 C), temperature source Oral, resp. rate 15, SpO2 100.00%. Physical Exam  Constitutional: He is oriented to person, place, and time. He appears well-developed and well-nourished. No distress.  HENT:  Head: Normocephalic and atraumatic.  Right Ear: External ear normal.  Left Ear: External ear normal.  Nose: Nose normal.  Mouth/Throat: Oropharynx is clear and moist. No oropharyngeal exudate.  Eyes: Conjunctivae normal are normal. Pupils are equal, round, and reactive to light. Right eye exhibits no discharge. Left eye exhibits no discharge. No scleral icterus.  Neck: Normal range of motion. Neck supple.  Cardiovascular: Normal rate and regular rhythm.   Respiratory: Effort normal and breath sounds normal. No respiratory distress. He has no wheezes. He has no rales.  GI: Soft. Bowel sounds are normal. He exhibits no distension. There is no tenderness. There is no rebound.  Musculoskeletal: He exhibits no edema and no tenderness.  Neurological: He is alert and oriented to person, place, and time.       Moves all extremities.  Skin: Skin is warm and dry. He is not diaphoretic.     Assessment/Plan #1. Chest  pain with palpitations - cardiologist has already seen the patient and started on IV amiodarone. NTG paste. Aspirin. Cycle cardiac markers. Keep patient n.p.o. in anticipation of possible procedure. Further recommendations per cardiology. Since patient also has frequent palpitations check thyroid function tests. #2. Alcoholism with impending delirium tremens - patient will be kept on IV Ativan per CIWA protocol. Thiamine. #3. Tobacco abuse - advised to quit smoking. #4. History of hypertension and hyperlipidemia - patient was not taking any medications almost a year for these. Check lipid panel and closely observe blood pressure trends. #5. Abdominal pain - lipase has been normal. KUB is unremarkable. Pain is more in the periumbilical area. I ordered a CT abdomen pelvis with by mouth contrast only. Check UA. Patient is afebrile at this time.  I have discussed with on-call cardiologist Dr. Sharyn Lull. Cardiologist is advised patient to be transferred to [facility] cone for possible cardiac procedure.  Eduard Clos 01/19/2012, 10:42 PM

## 2012-01-19 NOTE — ED Notes (Addendum)
Pt experiencing runs of VTach every 5-10 sec. Runs averaging between 3-10. Longest run 20 beats. Pads placed on pt and crash cart at bedside. MD notified. Pt has now converted back to NSR.

## 2012-01-20 ENCOUNTER — Encounter (HOSPITAL_COMMUNITY): Payer: Self-pay | Admitting: *Deleted

## 2012-01-20 ENCOUNTER — Inpatient Hospital Stay (HOSPITAL_COMMUNITY): Payer: PRIVATE HEALTH INSURANCE

## 2012-01-20 DIAGNOSIS — I472 Ventricular tachycardia: Secondary | ICD-10-CM | POA: Diagnosis present

## 2012-01-20 DIAGNOSIS — R109 Unspecified abdominal pain: Secondary | ICD-10-CM | POA: Diagnosis present

## 2012-01-20 DIAGNOSIS — F39 Unspecified mood [affective] disorder: Secondary | ICD-10-CM | POA: Diagnosis present

## 2012-01-20 DIAGNOSIS — R0602 Shortness of breath: Secondary | ICD-10-CM

## 2012-01-20 DIAGNOSIS — I4729 Other ventricular tachycardia: Secondary | ICD-10-CM | POA: Diagnosis present

## 2012-01-20 DIAGNOSIS — I10 Essential (primary) hypertension: Secondary | ICD-10-CM | POA: Diagnosis present

## 2012-01-20 LAB — COMPREHENSIVE METABOLIC PANEL
Alkaline Phosphatase: 90 U/L (ref 39–117)
BUN: 8 mg/dL (ref 6–23)
CO2: 27 mEq/L (ref 19–32)
GFR calc Af Amer: >90 mL/min (ref >90)
GFR calc non Af Amer: >90 mL/min (ref >90)
Glucose, Bld: 97 mg/dL (ref 70–99)
Potassium: 3.8 mEq/L (ref 3.5–5.1)
Total Protein: 6.9 g/dL (ref 6.0–8.3)

## 2012-01-20 LAB — CBC WITH DIFFERENTIAL/PLATELET
Basophils Absolute: 0 10*3/uL (ref 0.0–0.1)
HCT: 43.5 % (ref 39.0–52.0)
Lymphocytes Relative: 19 % (ref 12–46)
Monocytes Absolute: 0.7 10*3/uL (ref 0.1–1.0)
Neutro Abs: 4.3 10*3/uL (ref 1.7–7.7)
Neutrophils Relative %: 68 % (ref 43–77)
Platelets: 134 10*3/uL — ABNORMAL LOW (ref 150–400)
RDW: 13.2 % (ref 11.5–15.5)
WBC: 6.4 10*3/uL (ref 4.0–10.5)

## 2012-01-20 LAB — T4, FREE: Free T4: 0.89 ng/dL (ref 0.80–1.80)

## 2012-01-20 LAB — CK TOTAL AND CKMB (NOT AT ARMC)
CK, MB: 2.1 ng/mL (ref 0.3–4.0)
Relative Index: 1.5 (ref 0.0–2.5)
Total CK: 142 U/L (ref 7–232)

## 2012-01-20 LAB — TSH: TSH: 4.755 u[IU]/mL — ABNORMAL HIGH (ref 0.350–4.500)

## 2012-01-20 LAB — T3, FREE: T3, Free: 3.6 pg/mL (ref 2.3–4.2)

## 2012-01-20 LAB — URINALYSIS, ROUTINE W REFLEX MICROSCOPIC
Bilirubin Urine: NEGATIVE
Glucose, UA: NEGATIVE mg/dL
Hgb urine dipstick: NEGATIVE
Protein, ur: NEGATIVE mg/dL
Specific Gravity, Urine: 1.008 (ref 1.005–1.030)
Urobilinogen, UA: 0.2 mg/dL (ref 0.0–1.0)

## 2012-01-20 LAB — TROPONIN I: Troponin I: <0.3 ng/mL (ref <0.30)

## 2012-01-20 LAB — GLUCOSE, CAPILLARY
Glucose-Capillary: 109 mg/dL — ABNORMAL HIGH (ref 70–99)
Glucose-Capillary: 103 mg/dL — ABNORMAL HIGH (ref 70–99)

## 2012-01-20 MED ORDER — SODIUM CHLORIDE 0.9 % IV SOLN
INTRAVENOUS | Status: AC
  Administered 2012-01-20: 20 mL/h via INTRAVENOUS

## 2012-01-20 MED ORDER — POTASSIUM CHLORIDE IN NACL 20-0.9 MEQ/L-% IV SOLN
INTRAVENOUS | Status: DC
  Administered 2012-01-20 – 2012-01-21 (×3): via INTRAVENOUS
  Filled 2012-01-20 (×5): qty 1000

## 2012-01-20 MED ORDER — ENOXAPARIN SODIUM 40 MG/0.4ML ~~LOC~~ SOLN
40.0000 mg | SUBCUTANEOUS | Status: DC
  Administered 2012-01-20 – 2012-01-23 (×4): 40 mg via SUBCUTANEOUS
  Filled 2012-01-20 (×5): qty 0.4

## 2012-01-20 MED ORDER — ASPIRIN EC 325 MG PO TBEC
325.0000 mg | DELAYED_RELEASE_TABLET | Freq: Every day | ORAL | Status: DC
Start: 2012-01-20 — End: 2012-01-24
  Administered 2012-01-20 – 2012-01-24 (×5): 325 mg via ORAL
  Filled 2012-01-20 (×5): qty 1

## 2012-01-20 MED ORDER — SODIUM CHLORIDE 0.9 % IJ SOLN
3.0000 mL | Freq: Two times a day (BID) | INTRAMUSCULAR | Status: DC
  Administered 2012-01-20 – 2012-01-24 (×8): 3 mL via INTRAVENOUS

## 2012-01-20 MED ORDER — PANTOPRAZOLE SODIUM 40 MG IV SOLR
40.0000 mg | INTRAVENOUS | Status: DC
  Filled 2012-01-20: qty 40

## 2012-01-20 MED ORDER — VITAMIN B-1 100 MG PO TABS
100.0000 mg | ORAL_TABLET | Freq: Every day | ORAL | Status: DC
  Administered 2012-01-20 – 2012-01-23 (×4): 100 mg via ORAL
  Filled 2012-01-20 (×4): qty 1

## 2012-01-20 MED ORDER — ONDANSETRON HCL 4 MG/2ML IJ SOLN
4.0000 mg | Freq: Four times a day (QID) | INTRAMUSCULAR | Status: DC | PRN
  Administered 2012-01-21: 4 mg via INTRAVENOUS
  Filled 2012-01-20: qty 2

## 2012-01-20 MED ORDER — LORAZEPAM 2 MG/ML IJ SOLN
0.0000 mg | Freq: Two times a day (BID) | INTRAMUSCULAR | Status: DC
  Administered 2012-01-22 – 2012-01-23 (×2): 1 mg via INTRAVENOUS
  Filled 2012-01-20: qty 1

## 2012-01-20 MED ORDER — ADULT MULTIVITAMIN W/MINERALS CH: 1.0000 | ORAL_TABLET | Freq: Every day | ORAL | Status: DC

## 2012-01-20 MED ORDER — ONDANSETRON HCL 4 MG/2ML IJ SOLN
INTRAMUSCULAR | Status: AC
  Administered 2012-01-20: 4 mg
  Filled 2012-01-20: qty 2

## 2012-01-20 MED ORDER — ACETAMINOPHEN 650 MG RE SUPP: 650.0000 mg | Freq: Four times a day (QID) | RECTAL | Status: DC | PRN

## 2012-01-20 MED ORDER — METHADONE HCL 10 MG PO TABS
10.0000 mg | ORAL_TABLET | Freq: Every day | ORAL | Status: DC
  Administered 2012-01-20 – 2012-01-22 (×3): 20 mg via ORAL
  Administered 2012-01-23: 10 mg via ORAL
  Administered 2012-01-24: 20 mg via ORAL
  Filled 2012-01-20 (×3): qty 1
  Filled 2012-01-20 (×3): qty 2

## 2012-01-20 MED ORDER — HYDROCODONE-ACETAMINOPHEN 5-325 MG PO TABS: 1.0000 | ORAL_TABLET | Freq: Three times a day (TID) | ORAL | Status: DC | PRN

## 2012-01-20 MED ORDER — ACETAMINOPHEN 325 MG PO TABS: 650.0000 mg | ORAL_TABLET | Freq: Four times a day (QID) | ORAL | Status: DC | PRN

## 2012-01-20 MED ORDER — ONDANSETRON HCL 4 MG PO TABS: 4.0000 mg | ORAL_TABLET | Freq: Four times a day (QID) | ORAL | Status: DC | PRN

## 2012-01-20 MED ORDER — LORAZEPAM 2 MG/ML IJ SOLN
1.0000 mg | Freq: Four times a day (QID) | INTRAMUSCULAR | Status: AC | PRN
  Administered 2012-01-20 – 2012-01-22 (×2): 1 mg via INTRAVENOUS
  Filled 2012-01-20 (×7): qty 1

## 2012-01-20 MED ORDER — FOLIC ACID 1 MG PO TABS: 1.0000 mg | ORAL_TABLET | Freq: Every day | ORAL | Status: DC

## 2012-01-20 MED ORDER — LORAZEPAM 1 MG PO TABS
1.0000 mg | ORAL_TABLET | Freq: Four times a day (QID) | ORAL | Status: AC | PRN
  Administered 2012-01-20 – 2012-01-22 (×5): 1 mg via ORAL
  Filled 2012-01-20 (×6): qty 1

## 2012-01-20 MED ORDER — PANTOPRAZOLE SODIUM 40 MG PO TBEC
40.0000 mg | DELAYED_RELEASE_TABLET | Freq: Every day | ORAL | Status: DC
  Administered 2012-01-20 – 2012-01-21 (×2): 40 mg via ORAL
  Filled 2012-01-20 (×2): qty 1

## 2012-01-20 MED ORDER — OXYCODONE HCL 5 MG PO TABS
5.0000 mg | ORAL_TABLET | ORAL | Status: DC | PRN
  Administered 2012-01-20 – 2012-01-23 (×5): 5 mg via ORAL
  Filled 2012-01-20 (×5): qty 1

## 2012-01-20 MED ORDER — IOHEXOL 300 MG/ML  SOLN
20.0000 mL | INTRAMUSCULAR | Status: AC
  Administered 2012-01-20: 20 mL via ORAL

## 2012-01-20 MED ORDER — LORAZEPAM 2 MG/ML IJ SOLN
0.0000 mg | Freq: Four times a day (QID) | INTRAMUSCULAR | Status: AC
  Administered 2012-01-20 (×2): 1 mg via INTRAVENOUS
  Administered 2012-01-20 – 2012-01-21 (×3): 2 mg via INTRAVENOUS
  Administered 2012-01-21: 1 mg via INTRAVENOUS
  Administered 2012-01-21 (×2): 2 mg via INTRAVENOUS
  Filled 2012-01-20 (×3): qty 1

## 2012-01-20 MED ORDER — THIAMINE HCL 100 MG/ML IJ SOLN
100.0000 mg | Freq: Every day | INTRAMUSCULAR | Status: DC
  Filled 2012-01-20: qty 1

## 2012-01-20 NOTE — Progress Notes (Signed)
[facility] hospitalist progress note. Chief complaint. Transfer note. History of present illness. This 46 year old male presented to [facility] long emergency room with complaints of chest pain and palpitation. Was seen by cardiology and [facility] hospitalist and admitted through [facility] long emergency room. Cardiology his impression was of recurrent nonsustained VT rule out ischemia. Was initiated on amiodarone and plan follow serial cardiac enzymes and EKGs. Patient was felt to require transfer to [facility] and has now arrived. I'm seeing the patient to ensure he remained stable and that his orders transferred appropriately. The patient appears jittery and tremulous but is alert and able to give a history. He states he has been feeling intermittent slight central chest pain but not currently. He denies any dyspnea. Vital signs. Temperature 98.2, pulse 80, respirations 16, blood pressure 151/90. O2 sats 98%. General appearance. Well-developed middle-aged male who is alert and cooperative. He appears quite anxious and tremulous. Cardiac. Regular rate and rhythm. Lungs. Breath sounds clear and equal. Abdomen. Soft with positive bowel sounds. Mild diffuse pain with palpation but no guarding or rebound tenderness. Impression/plan. Problem #1 chest pain and palpitations. Dr. Sharyn Lull of cardiology has seen the patient and initiated on amiodarone. Currently the patient looks like normal sinus rhythm on the monitor. Appears clinically stable and I have nothing further to add at this time. All orders appear to transferred appropriately.

## 2012-01-20 NOTE — Progress Notes (Signed)
[facility] Progress Note [place] TEAM 1 - Stepdown/ICU TEAM   Mark [name] AVW:[id number] DOB: 14-Sep-1965 DOA: 01/19/2012 PCP: Lenora Boys, MD  Brief narrative: 46 year old patient with known hypertension and dyslipidemia who does not take his medications. Patient reports he discontinued his medications himself. He also has an underlying history of alcoholism. He presented to the emergency department because of chest pain and tachycardia palpitations. In the emergency department he was found to have frequent runs of PVCs and nonsustained ventricular tachycardia. Otherwise initial cardiac isoenzymes and EKG were nonischemic. Chest x-ray was also unremarkable. Cardiology was consulted and ordered an amiodarone drip. In addition to the above patient endorses utilizing methadone for chronic back pain. He also complained of periumbilical pain for several days. Because of concerns patient might need to undergo cardiac catheterization patient was subsequently transferred to [facility] and was admitted to the step down unit for further monitoring and treatment.  Assessment/Plan:  Ventricular tachycardia/Chest pain *Management per cardiology/Dr. 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 alcoholism that has resulted in a suboptimal ejection fraction and has predisposed this patient to ventricular arrhythmias  Alcohol abuse/mild transaminitis *His blood alcohol level was 270 at presentation *Continue CIWA protocol-scores today have been between 4 and 15 *Platelets 154,000 at admission  and currently have decreased to 134,000 so we'll need to follow CBC  HTN (hypertension) *Blood pressure moderately controlled *Addition of beta blocker should help  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 Denies chest pain or shortness of breath. Abdominal pain has apparently resolved.   Objective: Blood pressure 149/94, pulse 76, temperature 98 F (36.7 C), temperature source Oral, resp. rate 17, height 6\' 4"  (1.93 m), weight 89.5 kg (197 lb 5 oz), SpO2 95.00%.  Intake/Output Summary (Last 24 hours) at 01/20/12 1217 Last data filed at 01/20/12 1100  Gross per 24 hour  Intake 1645.47 ml  Output   3300 ml  Net -1654.53 ml     Exam: General: No acute respiratory distress, flushed in appearance Lungs: Clear to auscultation bilaterally without wheezes or crackles, room air Cardiovascular: Iregular rate and rhythm without murmur gallop or rub normal S1 and S2-sinus tach with frequent unifocal PVCs, IV amiodarone infusing, no peripheral edema noted Abdomen: Nontender, nondistended, soft, bowel sounds positive, no rebound, no ascites, no appreciable mass Musculoskeletal: No significant cyanosis, clubbing of bilateral lower extremities Neurological: Patient is alert and oriented x3 moves all extremities x4, exam non-focal  Data Reviewed: Basic Metabolic Panel:  Lab 01/20/12 8295 01/19/12 1905  NA 140 139  K 3.8 3.1*  CL 99 96  CO2 27 28  GLUCOSE 97 130*  BUN 8 8  CREATININE 0.64 0.71  CALCIUM 9.1 9.6  MG -- 2.0  PHOS -- --    Liver Function Tests:  Lab 01/20/12  0540 01/19/12 1905  AST 62* 78*  ALT 46 54*  ALKPHOS 90 98  BILITOT 0.6 0.4  PROT 6.9 7.5  ALBUMIN 4.0 4.4    Lab 01/19/12 2146  LIPASE 30  AMYLASE --   CBC:  Lab 01/20/12 0540 01/19/12 1905  WBC 6.4 4.3  NEUTROABS 4.3 --  HGB 15.0 15.8  HCT 43.5 44.9  MCV 92.9 92.0  PLT 134* 154   Cardiac Enzymes:  Lab 01/20/12 0540 01/19/12 2146  CKTOTAL 142 152  CKMB 2.1 2.4  CKMBINDEX -- --  TROPONINI <0.30 <0.30   BNP (last 3 results)  Basename 01/19/12 1905 12/09/11 1655  PROBNP 135.3* 110.2   CBG:  Lab 01/20/12 0815 01/20/12 0432  GLUCAP 103* 109*    Recent Results (from the past 240 hour(s))  MRSA PCR SCREENING     Status: Normal   Collection Time   01/20/12  1:44 AM      Component Value Range Status Comment   MRSA by PCR NEGATIVE  NEGATIVE Final      Studies:  Recent x-ray studies have been reviewed in detail by the Attending Physician  Scheduled Meds:  Reviewed in detail by the Attending Physician   Junious Silk, ANP [facility] Office  [phone] Pager [phone]  On-Call/Text Page:      [email or web address]      password [email or web address]  If 7PM-7AM, please contact night-coverage [email or web address] Password [email or web address] 01/20/2012, 12:17 PM   LOS: 1 day   I have personally examined this patient and reviewed the entire database. I have reviewed the above note, made any necessary editorial changes, and agree with its content.  Lonia Blood, MD [facility]

## 2012-01-20 NOTE — Progress Notes (Signed)
Subjective:  Patient denies any chest pain or shortness of breath. No further V. tach on the monitor  Objective:  Vital Signs in the last 24 hours: Temp:  [98.2 F (36.8 C)-98.7 F (37.1 C)] 98.3 F (36.8 C) (11/11 0745) Pulse Rate:  [52-117] 76  (11/11 1100) Resp:  [10-21] 13  (11/11 0745) BP: (115-169)/(9-128) 149/94 mmHg (11/11 1100) SpO2:  [95 %-100 %] 95 % (11/11 1100) Weight:  [89.5 kg (197 lb 5 oz)] 89.5 kg (197 lb 5 oz) (11/11 0202)  Intake/Output from previous day: 11/10 0701 - 11/11 0700 In: 1378.7 [I.V.:1278.7; IV Piggyback:100] Out: 2800 [Urine:2800] Intake/Output from this shift: Total I/O In: 266.8 [I.V.:266.8] Out: 500 [Urine:500]  Physical Exam: Neck: no adenopathy, no carotid bruit, no JVD and supple, symmetrical, trachea midline Lungs: clear to auscultation bilaterally Heart: regular rate and rhythm, S1, S2 normal and Soft systolic murmur noted Abdomen: Soft bowel sounds present generalized tenderness noted Extremities: extremities normal, atraumatic, no cyanosis or edema  Lab Results:  [facility] 01/20/12 0540 01/19/12 1905  WBC 6.4 4.3  HGB 15.0 15.8  PLT 134* 154    Basename 01/20/12 0540 01/19/12 1905  NA 140 139  K 3.8 3.1*  CL 99 96  CO2 27 28  GLUCOSE 97 130*  BUN 8 8  CREATININE 0.64 0.71    Basename 01/20/12 0540 01/19/12 2146  TROPONINI <0.30 <0.30   Hepatic Function Panel  Basename 01/20/12 0540 01/19/12 1905  PROT 6.9 --  ALBUMIN 4.0 --  AST 62* --  ALT 46 --  ALKPHOS 90 --  BILITOT 0.6 --  BILIDIR -- 0.1  IBILI -- 0.3   No results found for this basename: CHOL in the last 72 hours No results found for this basename: PROTIME in the last 72 hours  Imaging: Imaging results have been reviewed and Ct Abdomen Pelvis Wo Contrast  01/20/2012  *RADIOLOGY REPORT*  Clinical Data: Periumbilical pain and bloating.  Going to the alcohol withdrawal.  CT ABDOMEN AND PELVIS WITHOUT CONTRAST  Technique:  Multidetector CT imaging of the  abdomen and pelvis was performed following the standard protocol without intravenous contrast.  Comparison: The the lung bases show minimal subsegmental atelectasis but are otherwise clear.  The heart is normal in size.  Findings: The liver is normal in size, but shows extensive fatty infiltration.  No liver mass or focal lesion is seen.  The liver is normal morphologically.  Normal spleen, gallbladder and pancreas.  No adrenal masses.  Normal kidneys, ureters and bladder.  No adenopathy.  No abnormal fluid collections.  Normal bowel. Normal appendix.  Status post L5-S1 fusion with an intervertebral cage that shows some subsidence but is well positioned otherwise.  No osteoblastic or osteolytic lesions.  IMPRESSION: No acute findings.  Extensive fatty infiltration of the liver.  Postsurgical changes at L5-S1.   Original Report Authenticated By: Amie Portland, M.D.    Dg Abd 1 View  01/19/2012  *RADIOLOGY REPORT*  Clinical Data: 46 year old male with pain.  ABDOMEN - 1 VIEW  Comparison: 01/18/2005.  Findings: Supine AP portable view 2216 hours. Nonobstructed bowel gas pattern.  Resuscitation pad projects over the epigastric region.  Chronic lower spinal fusion changes.  No definite pneumoperitoneum.  IMPRESSION: Nonobstructed bowel gas pattern.   Original Report Authenticated By: Erskine Speed, M.D.    Dg Chest Port 1 View  01/19/2012  *RADIOLOGY REPORT*  Clinical Data: 46 year old male tachycardia.  PORTABLE CHEST - 1 VIEW  Comparison: Chest CTA 12/09/2011 and earlier.  Findings: Two portable AP views of the chest 1935 hours. Resuscitation pad in place.  Stable lung volumes.  Stable cardiac size and mediastinal contours.  Cervical ACDF hardware re- identified.  No pneumothorax or pleural effusion evident.  No pulmonary edema or confluent pulmonary opacity.  IMPRESSION: No acute cardiopulmonary abnormality.   Original Report Authenticated By: Erskine Speed, M.D.     Cardiac Studies:  Assessment/Plan:  Status  post chest pain MI ruled out Status post nonsustained VT Controlled hypertension Status post hypokalemia Fatty liver EtOH abuse Impending DTs Increased LFTs secondary to EtOH abuse Plan DC amiodarone Increase beta blockers as tolerated Schedule for nuclear stress test in a.m.   LOS: 1 day    Rondale Nies N 01/20/2012, 12:01 PM

## 2012-01-20 NOTE — Care Management Note (Addendum)
    Page 1 of 1   01/22/2012     2:42:18 PM   CARE MANAGEMENT NOTE 01/22/2012  Patient:  Mark [name], Mark [name]   Account Number:  [id number]  Date Initiated:  01/20/2012  Documentation initiated by:  Junius Creamer  Subjective/Objective Assessment:   adm w arrthymia     Action/Plan:   lives w fa, pcp dr Molly Maduro fried   Anticipated DC Date:     Anticipated DC Plan:  HOME/SELF CARE  In-house referral  Clinical Social Worker      DC Planning Services  CM consult      Choice offered to / List presented to:             Status of service:   Medicare Important Message given?   (If response is "NO", the following Medicare IM given date fields will be blank) Date Medicare IM given:   Date Additional Medicare IM given:    Discharge Disposition:  HOME/SELF CARE  Per UR Regulation:  Reviewed for med. necessity/level of care/duration of stay  If discussed at Long Length of Stay Meetings, dates discussed:    Comments:  11/13 14:39p debbie Norita Meigs rn,bsn spoke w pt. he lives w his mother who is retired Human resources officer. he does not want any hhc. he has no ins and pays for meds out of pocket. left sticky note for md to put on as many 4.00 meds as possible. gave pt primary care infromation sheet and prescription card that may help w brand name meds.  11/11 9:52a debbie Aniyla Harling rn,bsn [phone]

## 2012-01-20 NOTE — Progress Notes (Signed)
Pt came in from W/L ed via, care link to [id number]. Pt is alert and oriented, put on the monitor and was made comfortable in bed. Will continue to monitor pt. ---Kamaile Zachow, Shatika Grinnell, rn

## 2012-01-20 NOTE — ED Notes (Signed)
[facility] called for transport. 

## 2012-01-20 NOTE — ED Notes (Signed)
Report called to Step Down RN.  

## 2012-01-21 ENCOUNTER — Inpatient Hospital Stay (HOSPITAL_COMMUNITY): Payer: PRIVATE HEALTH INSURANCE

## 2012-01-21 DIAGNOSIS — F10239 Alcohol dependence with withdrawal, unspecified: Secondary | ICD-10-CM | POA: Diagnosis present

## 2012-01-21 LAB — COMPREHENSIVE METABOLIC PANEL
ALT: 50 U/L (ref 0–53)
Alkaline Phosphatase: 97 U/L (ref 39–117)
BUN: 11 mg/dL (ref 6–23)
CO2: 27 mEq/L (ref 19–32)
Chloride: 98 mEq/L (ref 96–112)
GFR calc Af Amer: >90 mL/min (ref >90)
GFR calc non Af Amer: >90 mL/min (ref >90)
Glucose, Bld: 96 mg/dL (ref 70–99)
Potassium: 3.6 mEq/L (ref 3.5–5.1)
Sodium: 138 mEq/L (ref 135–145)
Total Bilirubin: 1.3 mg/dL — ABNORMAL HIGH (ref 0.3–1.2)
Total Protein: 7.2 g/dL (ref 6.0–8.3)

## 2012-01-21 LAB — CBC
HCT: 44.9 % (ref 39.0–52.0)
MCHC: 35.6 g/dL (ref 30.0–36.0)
Platelets: 132 10*3/uL — ABNORMAL LOW (ref 150–400)
RDW: 12.9 % (ref 11.5–15.5)
WBC: 7 10*3/uL (ref 4.0–10.5)

## 2012-01-21 LAB — CK TOTAL AND CKMB (NOT AT ARMC)
CK, MB: 2.5 ng/mL (ref 0.3–4.0)
CK, MB: 2.7 ng/mL (ref 0.3–4.0)
Relative Index: 1.8 (ref 0.0–2.5)
Relative Index: 1.9 (ref 0.0–2.5)
Total CK: 142 U/L (ref 7–232)
Total CK: 139 U/L (ref 7–232)
Total CK: 149 U/L (ref 7–232)

## 2012-01-21 MED ORDER — LORAZEPAM 2 MG/ML IJ SOLN
2.0000 mg | Freq: Once | INTRAMUSCULAR | Status: AC
  Administered 2012-01-21: 2 mg via INTRAVENOUS

## 2012-01-21 MED ORDER — HALOPERIDOL LACTATE 5 MG/ML IJ SOLN
2.0000 mg | Freq: Once | INTRAMUSCULAR | Status: AC
  Administered 2012-01-21: 2 mg via INTRAMUSCULAR

## 2012-01-21 MED ORDER — REGADENOSON 0.4 MG/5ML IV SOLN
0.4000 mg | Freq: Once | INTRAVENOUS | Status: AC
  Administered 2012-01-21: 0.4 mg via INTRAVENOUS
  Filled 2012-01-21: qty 5

## 2012-01-21 MED ORDER — HALOPERIDOL LACTATE 5 MG/ML IJ SOLN
INTRAMUSCULAR | Status: AC
  Administered 2012-01-21: 2 mg via INTRAMUSCULAR
  Filled 2012-01-21: qty 1

## 2012-01-21 MED ORDER — TECHNETIUM TC 99M SESTAMIBI GENERIC - CARDIOLITE
10.0000 | Freq: Once | INTRAVENOUS | Status: AC | PRN
  Administered 2012-01-21: 10 via INTRAVENOUS

## 2012-01-21 MED ORDER — TECHNETIUM TC 99M SESTAMIBI GENERIC - CARDIOLITE
30.0000 | Freq: Once | INTRAVENOUS | Status: AC | PRN
  Administered 2012-01-21: 30 via INTRAVENOUS

## 2012-01-21 NOTE — Progress Notes (Addendum)
Pt agitated and restless. Pt up in room, pulling off telemetry and IV. Ambulates in hall and requesting when he will be d/c'd (pt thinks it is daytime). Reoriented to time but patient very forgetful. Mark [name] notified and order obtained.

## 2012-01-21 NOTE — Progress Notes (Signed)
Subjective:  Patient denies any chest pain or shortness of breath states feels hungry schedule for nuclear stress test today  Objective:  Vital Signs in the last 24 hours: Temp:  [97.9 F (36.6 C)-98.2 F (36.8 C)] 98.2 F (36.8 C) (11/12 0800) Pulse Rate:  [30-86] 79  (11/12 0400) Resp:  [10-20] 15  (11/12 0800) BP: (119-163)/(76-108) 119/76 mmHg (11/12 0800) SpO2:  [74 %-99 %] 97 % (11/12 0800) Weight:  [81.4 kg (179 lb 7.3 oz)] 81.4 kg (179 lb 7.3 oz) (11/12 0400)  Intake/Output from previous day: 11/11 0701 - 11/12 0700 In: 1508.8 [P.O.:240; I.V.:1266.8; IV Piggyback:2] Out: 1725 [Urine:1725] Intake/Output from this shift: Total I/O In: 100 [I.V.:100] Out: -   Physical Exam: Neck: no adenopathy, no carotid bruit, no JVD and supple, symmetrical, trachea midline Lungs: clear to auscultation bilaterally Heart: regular rate and rhythm, S1, S2 normal and Soft systolic murmur noted Abdomen: Soft part sounds present mild generalized tenderness Extremities: extremities normal, atraumatic, no cyanosis or edema  Lab Results:  Basename 01/20/12 0540 01/19/12 1905  WBC 6.4 4.3  HGB 15.0 15.8  PLT 134* 154    Basename 01/20/12 0540 01/19/12 1905  NA 140 139  K 3.8 3.1*  CL 99 96  CO2 27 28  GLUCOSE 97 130*  BUN 8 8  CREATININE 0.64 0.71    Basename 01/20/12 0540 01/19/12 2146  TROPONINI <0.30 <0.30   Hepatic Function Panel  Basename 01/20/12 0540 01/19/12 1905  PROT 6.9 --  ALBUMIN 4.0 --  AST 62* --  ALT 46 --  ALKPHOS 90 --  BILITOT 0.6 --  BILIDIR -- 0.1  IBILI -- 0.3   No results found for this basename: CHOL in the last 72 hours No results found for this basename: PROTIME in the last 72 hours  Imaging: Imaging results have been reviewed and Ct Abdomen Pelvis Wo Contrast  01/20/2012  *RADIOLOGY REPORT*  Clinical Data: Periumbilical pain and bloating.  Going to the alcohol withdrawal.  CT ABDOMEN AND PELVIS WITHOUT CONTRAST  Technique:  Multidetector CT  imaging of the abdomen and pelvis was performed following the standard protocol without intravenous contrast.  Comparison: The the lung bases show minimal subsegmental atelectasis but are otherwise clear.  The heart is normal in size.  Findings: The liver is normal in size, but shows extensive fatty infiltration.  No liver mass or focal lesion is seen.  The liver is normal morphologically.  Normal spleen, gallbladder and pancreas.  No adrenal masses.  Normal kidneys, ureters and bladder.  No adenopathy.  No abnormal fluid collections.  Normal bowel. Normal appendix.  Status post L5-S1 fusion with an intervertebral cage that shows some subsidence but is well positioned otherwise.  No osteoblastic or osteolytic lesions.  IMPRESSION: No acute findings.  Extensive fatty infiltration of the liver.  Postsurgical changes at L5-S1.   Original Report Authenticated By: Amie Portland, M.D.    Dg Abd 1 View  01/19/2012  *RADIOLOGY REPORT*  Clinical Data: 46 year old male with pain.  ABDOMEN - 1 VIEW  Comparison: 01/18/2005.  Findings: Supine AP portable view 2216 hours. Nonobstructed bowel gas pattern.  Resuscitation pad projects over the epigastric region.  Chronic lower spinal fusion changes.  No definite pneumoperitoneum.  IMPRESSION: Nonobstructed bowel gas pattern.   Original Report Authenticated By: Erskine Speed, M.D.    Dg Chest Port 1 View  01/19/2012  *RADIOLOGY REPORT*  Clinical Data: 46 year old male tachycardia.  PORTABLE CHEST - 1 VIEW  Comparison: Chest CTA 12/09/2011  and earlier.  Findings: Two portable AP views of the chest 1935 hours. Resuscitation pad in place.  Stable lung volumes.  Stable cardiac size and mediastinal contours.  Cervical ACDF hardware re- identified.  No pneumothorax or pleural effusion evident.  No pulmonary edema or confluent pulmonary opacity.  IMPRESSION: No acute cardiopulmonary abnormality.   Original Report Authenticated By: Erskine Speed, M.D.     Cardiac  Studies:  Assessment/Plan:  Atypical chest pain rule out MI  Status post recurrent nonsustained VT rule out ischemia  Hypokalemia  Hypertension  GERD  Alcohol abuse  Tobacco abuse  Hypercholesteremia Plan Schedule for nuclear stress test today  LOS: 2 days    Iviona Hole N 01/21/2012, 9:35 AM

## 2012-01-21 NOTE — Progress Notes (Signed)
[facility] Progress Note [place] TEAM 1 - Stepdown/ICU TEAM   Mark [name] QMV:[id number] DOB: 1965-11-11 DOA: 01/19/2012 PCP: Lenora Boys, MD  Brief narrative: 46 year old patient with known hypertension and dyslipidemia who does not take his medications. Patient reports he discontinued his medications himself. He also has an underlying history of alcoholism. He presented to the emergency department because of chest pain and tachycardia palpitations. In the emergency department he was found to have frequent runs of PVCs and nonsustained ventricular tachycardia. Otherwise initial cardiac isoenzymes and EKG were nonischemic. Chest x-ray was also unremarkable. Cardiology was consulted and ordered an amiodarone drip. In addition to the above patient endorses utilizing methadone for chronic back pain. He also complained of periumbilical pain for several days. Because of concerns patient might need to undergo cardiac catheterization patient was subsequently transferred to [facility] and was admitted to the step down unit for further monitoring and treatment.  Assessment/Plan:  Ventricular tachycardia/Chest pain *Management per cardiology/Dr. Sharyn Lull *Amiodarone drip has been discontinued in favor of beta blockers which will be up titrated *Nuclear stress test IN PROGRESS *Cardiac enzymes remain negative and patient currently is not experiencing chest pain and suspect chest pain likely related to the ventricular tachycardia he was experiencing *No prior echocardiograms for comparison but suspect patient may have a dilated cardiomyopathy related to hypertension and alcoholism that has resulted in a suboptimal ejection fraction and has predisposed this patient to ventricular arrhythmias  Alcohol abuse/mild transaminitis *His blood alcohol level was 270 at presentation- last drink Sunday  *Continue CIWA protocol-scores today have been between 10 and 15. 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 No chest pain or tachycardia palpitations. States was having "partially awake dreams". This likely represents hallucinations he was experiencing overnight   Objective: Blood pressure 143/95, pulse 114, temperature 98.2 F (36.8 C), temperature source Oral, resp. rate 14, height 6\' 4"  (1.93 m), weight 81.4 kg (179 lb 7.3 oz), SpO2 97.00%.  Intake/Output Summary (Last 24 hours) at 01/21/12 1322 Last data filed at 01/21/12 1000  Gross per 24 hour  Intake   1172 ml  Output   1625 ml  Net   -453 ml     Exam: General: No acute respiratory distress Lungs: Clear to auscultation bilaterally without wheezes or crackles, room air Cardiovascular: Iregular rate and rhythm without murmur gallop or rub normal S1 and S2-sinus rhythm with unifocal PVCs, IV amiodarone infusing, no peripheral edema noted Abdomen: Nontender, nondistended, soft, bowel sounds positive, no rebound, no ascites, no appreciable mass Musculoskeletal: No significant cyanosis, clubbing of bilateral lower extremities Neurological: Patient is alert and oriented x3 moves all extremities x4, exam non-focal  Data Reviewed: Basic Metabolic Panel:  Lab 01/21/12 2841 01/20/12 0540 01/19/12 1905  NA 138 140 139  K 3.6 3.8 3.1*  CL 98 99  96  CO2 27 27 28   GLUCOSE 96 97 130*  BUN 11 8  8  CREATININE 0.68 0.64 0.71  CALCIUM 9.7 9.1 9.6  MG -- -- 2.0  PHOS -- -- --   Liver Function Tests:  Lab 01/21/12 0905 01/20/12 0540 01/19/12 1905  AST 65* 62* 78*  ALT 50 46 54*  ALKPHOS 97 90 98  BILITOT 1.3* 0.6 0.4  PROT 7.2 6.9 7.5  ALBUMIN 4.0 4.0 4.4    Lab 01/19/12 2146  LIPASE 30  AMYLASE --   CBC:  Lab 01/21/12 0905 01/20/12 0540 01/19/12 1905  WBC 7.0 6.4 4.3  NEUTROABS -- 4.3 --  HGB 16.0 15.0 15.8  HCT 44.9 43.5 44.9  MCV 91.1 92.9 92.0  PLT 132* 134* 154   Cardiac Enzymes:  Lab 01/21/12 0905 01/21/12 0256 01/20/12 1526 01/20/12 0540 01/19/12 2146  CKTOTAL 142 139 142 142 152  CKMB 2.7 2.5 2.1 2.1 2.4  CKMBINDEX -- -- -- -- --  TROPONINI -- -- -- <0.30 <0.30   BNP (last 3 results)  Basename 01/19/12 1905 12/09/11 1655  PROBNP 135.3* 110.2   CBG:  Lab 01/20/12 1211 01/20/12 0815 01/20/12 0432  GLUCAP 110* 103* 109*    Recent Results (from the past 240 hour(s))  MRSA PCR SCREENING     Status: Normal   Collection Time   01/20/12  1:44 AM      Component Value Range Status Comment   MRSA by PCR NEGATIVE  NEGATIVE Final      Studies:  Recent x-ray studies have been reviewed in detail by the Attending Physician  Scheduled Meds:  Reviewed in detail by the Attending Physician   Junious Silk, ANP [facility] Office  [phone] Pager [phone]  On-Call/Text Page:      [email or web address]      password [email or web address]  If 7PM-7AM, please contact night-coverage [email or web address] Password [email or web address] 01/21/2012, 1:22 PM   LOS: 2 days   I have examined the patient and reviewed the chart. I agree with the above note which I have modified.   Calvert Cantor, MD [phone]

## 2012-01-21 NOTE — Progress Notes (Signed)
Medicated pt with ativan 1mg  PO

## 2012-01-21 NOTE — Progress Notes (Signed)
Pt having visual and audible hallucinations. Disoriented to time. "forgets" to call for nurse to get OOB. 15 on CIWA scale . Will notify MD.

## 2012-01-22 DIAGNOSIS — F17200 Nicotine dependence, unspecified, uncomplicated: Secondary | ICD-10-CM

## 2012-01-22 DIAGNOSIS — F10239 Alcohol dependence with withdrawal, unspecified: Secondary | ICD-10-CM

## 2012-01-22 DIAGNOSIS — F39 Unspecified mood [affective] disorder: Secondary | ICD-10-CM

## 2012-01-22 LAB — CK TOTAL AND CKMB (NOT AT ARMC)
CK, MB: 3.2 ng/mL (ref 0.3–4.0)
Relative Index: 1.2 (ref 0.0–2.5)
Total CK: 251 U/L — ABNORMAL HIGH (ref 7–232)

## 2012-01-22 MED ORDER — LOSARTAN POTASSIUM 50 MG PO TABS
50.0000 mg | ORAL_TABLET | Freq: Every day | ORAL | Status: DC
  Administered 2012-01-23 – 2012-01-24 (×2): 50 mg via ORAL
  Filled 2012-01-22 (×3): qty 1

## 2012-01-22 MED ORDER — LISINOPRIL 10 MG PO TABS
10.0000 mg | ORAL_TABLET | Freq: Every day | ORAL | Status: DC
  Filled 2012-01-22: qty 1

## 2012-01-22 NOTE — Progress Notes (Signed)
[facility] Progress Note  TEAM 1 - Stepdown/ICU TEAM   Mark [name] HYQ:[id number] DOB: 06/16/1965 DOA: 01/19/2012 PCP: Lenora Boys, MD  Brief narrative: 46 year old patient with known hypertension and dyslipidemia who does not take his medications. Patient reports he discontinued his medications himself. He also has an underlying history of alcoholism. He presented to the [facility] emergency department because of chest pain and tachycardia palpitations. In the emergency department he was found to have frequent runs of PVCs and nonsustained ventricular tachycardia. Otherwise initial cardiac isoenzymes and EKG were nonischemic. Chest x-ray was also unremarkable. Cardiology was consulted and ordered an amiodarone drip. In addition to the above patient endorsed utilizing methadone for chronic back pain. He also complained of periumbilical pain for several days. Because of concerns patient might need to undergo cardiac catheterization patient was subsequently transferred to [facility] and was admitted to the step down unit for further monitoring and treatment.  Assessment/Plan:  Ventricular tachycardia/Chest pain *Management per cardiology/Dr. Sharyn Lull *Amiodarone drip has been discontinued in favor of beta blockers which will be up titrated/ continued after discharge *Nuclear stress test demonstrated no inducible ischemia *Cardiac enzymes remain negative and patient currently is not experiencing chest pain and suspect chest pain likely related to the ventricular tachycardia he was experiencing  Severe systolic dysfunction EF 25% on Lexiscan *Suspect patient has underlying dilated cardiomyopathy related to alcoholism although with persistent tachycardia palpitations and nonsustained V. tach could primarily be mediated by persistent tachycardia *Cardiology is considering at a later date the possibility of a defibrillator if the patient is committed to alcohol cessation and  will also need a followup echocardiogram to confirm EF is still low *ACE inhibitor started today  Alcohol abuse/mild transaminitis *His blood alcohol level was 270 at presentation  *Continue CIWA protocol-scores today have been between 10 and 15. 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 Social work and case management consultation for discharge planning.  Consultants: Cardiology  Procedures: None  Antibiotics: None  HPI/Subjective: Alert without any specific complaints. He wants to discharge home. RN made [place] aware that patient's mother plans on having the patient move in with her after discharge and would like to be included on discharge planning.  Is tremulous and mildly agitated.  Appears quite anxious.     Objective: Blood pressure 141/87, pulse 84, temperature 97.7 F (36.5 C), temperature source Oral, resp. rate 18, height 6\' 4"  (1.93 m), weight 85.2 kg (187 lb 13.3 oz), SpO2 97.00%.  Intake/Output Summary (Last 24 hours) at 01/22/12 1403 Last data filed at 01/22/12 1200  Gross per 24 hour  Intake   3580 ml  Output    100 ml  Net   3480 ml     Exam: General: No acute respiratory distress Lungs: Clear to  auscultation bilaterally without wheezes or crackles, room air  Cardiovascular: Iregular rate and rhythm without murmur gallop or rub - no peripheral edema noted Abdomen: Nontender, nondistended, soft, bowel sounds positive, no rebound, no ascites, no appreciable mass Musculoskeletal: No significant cyanosis, clubbing of bilateral lower extremities Neurological: Patient is alert and moves all extremities  Data Reviewed: Basic Metabolic Panel:  Lab 01/21/12 9811 01/20/12 0540 01/19/12 1905  NA 138 140 139  K 3.6 3.8 3.1*  CL 98 99 96  CO2 27 27 28   GLUCOSE 96 97 130*  BUN 11 8 8   CREATININE 0.68 0.64 0.71  CALCIUM 9.7 9.1 9.6  MG -- -- 2.0  PHOS -- -- --   Liver Function Tests:  Lab 01/21/12 0905 01/20/12 0540 01/19/12 1905  AST 65* 62* 78*  ALT 50 46 54*  ALKPHOS 97 90 98  BILITOT 1.3* 0.6 0.4  PROT 7.2 6.9 7.5  ALBUMIN 4.0 4.0 4.4    Lab 01/19/12 2146  LIPASE 30  AMYLASE --   CBC:  Lab 01/21/12 0905 01/20/12 0540 01/19/12 1905  WBC 7.0 6.4 4.3  NEUTROABS -- 4.3 --  HGB 16.0 15.0 15.8  HCT 44.9 43.5 44.9  MCV 91.1 92.9 92.0  PLT 132* 134* 154   Cardiac Enzymes:  Lab 01/22/12 0834 01/22/12 0246 01/21/12 2120 01/21/12 1634 01/21/12 0905 01/20/12 0540 01/19/12 2146  CKTOTAL 238* 251* 149 139 142 -- --  CKMB 2.9 3.2 2.7 2.8 2.7 -- --  CKMBINDEX -- -- -- -- -- -- --  TROPONINI -- -- -- -- -- <0.30 <0.30   BNP (last 3 results)  Basename 01/19/12 1905 12/09/11 1655  PROBNP 135.3* 110.2   CBG:  Lab 01/20/12 1211 01/20/12 0815 01/20/12 0432  GLUCAP 110* 103* 109*    Recent Results (from the past 240 hour(s))  MRSA PCR SCREENING     Status: Normal   Collection Time   01/20/12  1:44 AM      Component Value Range Status Comment   MRSA by PCR NEGATIVE  NEGATIVE Final      Studies:  Recent x-ray studies have been reviewed in detail by the Attending Physician  Scheduled Meds:  Reviewed in detail by the Attending Physician   Junious Silk, ANP [facility] Office  [phone] Pager [phone]  On-Call/Text Page:      [email or web address]      password [email or web address]  If 7PM-7AM, please contact night-coverage [email or web address] Password [email or web address] 01/22/2012, 2:03 PM   LOS: 3 days   I have personally examined this patient and reviewed the entire database. I have reviewed the above note, made any necessary editorial changes, and agree with its content.  Lonia Blood, MD [facility]

## 2012-01-22 NOTE — Progress Notes (Signed)
Pt less agitated. CIWA 11. Pt c/o "insomnia" and restlessness. Will monitor.

## 2012-01-22 NOTE — Progress Notes (Signed)
Assisted pt with ambulation around nursing unit x 1 lap. Pt tolerated well. Gait steady. Pt back to room and assisted to bed. Pt request "more medication to help me rest. Last medication only helped me sleep for a hour." Will notify MD and continue to monitor.

## 2012-01-22 NOTE — Progress Notes (Signed)
Subjective:  Patient denies any chest pain or shortness of breath eager to go home denies any palpitations nuclear stress test negative for ischemia with markedly depressed LV systolic function with EF of 25% Objective:  Vital Signs in the last 24 hours: Temp:  [97.4 F (36.3 C)-98.1 F (36.7 C)] 98 F (36.7 C) (11/13 0852) Pulse Rate:  [80-134] 80  (11/13 0852) Resp:  [12-20] 18  (11/13 0852) BP: (117-138)/(66-104) 122/66 mmHg (11/13 0852) SpO2:  [96 %-100 %] 96 % (11/13 0852) Weight:  [85.2 kg (187 lb 13.3 oz)] 85.2 kg (187 lb 13.3 oz) (11/13 0600)  Intake/Output from previous day: 11/12 0701 - 11/13 0700 In: 3120 [P.O.:1920; I.V.:1200] Out: 500 [Urine:500] Intake/Output from this shift: Total I/O In: 610 [P.O.:360; I.V.:250] Out: -   Physical Exam: Neck: no adenopathy, no carotid bruit, no JVD and supple, symmetrical, trachea midline Lungs: clear to auscultation bilaterally Heart: regular rate and rhythm, S1, S2 normal and Soft systolic murmur noted no S3 gallop Abdomen: soft, non-tender; bowel sounds normal; no masses,  no organomegaly Extremities: extremities normal, atraumatic, no cyanosis or edema  Lab Results:  [facility] 01/21/12 0905 01/20/12 0540  WBC 7.0 6.4  HGB 16.0 15.0  PLT 132* 134*    Basename 01/21/12 0905 01/20/12 0540  NA 138 140  K 3.6 3.8  CL 98 99  CO2 27 27  GLUCOSE 96 97  BUN 11 8  CREATININE 0.68 0.64    Basename 01/20/12 0540 01/19/12 2146  TROPONINI <0.30 <0.30   Hepatic Function Panel  Basename 01/21/12 0905 01/19/12 1905  PROT 7.2 --  ALBUMIN 4.0 --  AST 65* --  ALT 50 --  ALKPHOS 97 --  BILITOT 1.3* --  BILIDIR -- 0.1  IBILI -- 0.3   No results found for this basename: CHOL in the last 72 hours No results found for this basename: PROTIME in the last 72 hours  Imaging: Imaging results have been reviewed and Nm Myocar Multi W/spect W/wall Motion / Ef  01/21/2012  *RADIOLOGY REPORT*  Clinical Data:  Chest pain.  Tobacco  abuse.  Technique:  Standard myocardial SPECT imaging performed after resting intravenous injection of 10 mCi Tc-71m sestamibi. Subsequently, intravenous infusion of Lexiscan performed under the supervision of the Cardiology staff.  At peak effect of the drug, 30 mCi Tc-6msestamibi  injected intravenously and standard myocardial SPECT imaging performed.  Quantitative gated imaging also performed to evaluate left ventricular wall motion and estimate left ventricular ejection fraction.  Comparison:  12/09/2011  MYOCARDIAL IMAGING WITH SPECT (REST AND PHARMACOLOGIC-STRESS)  Findings:  There is a mild fixed decreased uptake involving the apex and the inferior and mid segments of the inferior wall.  GATED LEFT VENTRICULAR WALL MOTION STUDY  Findings:  Review of the gated images demonstrates moderate to marked global hypokinesis and decreased left ventricular wall thickening.  LEFT VENTRICULAR EJECTION FRACTION  Findings:  QGS ejection fraction measures 25% , with an end- diastolic volume of 201 ml and an end-systolic volume of 151 ml.  IMPRESSION:  1.  No evidence for pharmacologically induced myocardial reversibility. 2.  Mild fixed defect involves the left ventricular apex and apical and mid segments of the inferior wall. 3.  Significantly diminished left ventricular systolic function. The left ventricular ejection fraction equals 25%.   Original Report Authenticated By: Signa Kell, M.D.     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 Will repeat 2-D echo her as outpatient was on maximum dose  of beta blockers and ACE inhibitors. If EF remains below 35% may need ICD in the future  LOS: 3 days    Garner Dullea N 01/22/2012, 12:56 PM

## 2012-01-22 NOTE — Progress Notes (Signed)
Pt transferred to room 3030 with belongings, chart, and meds via wheelchair. Pt denies pain or any other complaints at this time. Report given to [facility], Charity fundraiser.  Dawson Bills, RN

## 2012-01-23 DIAGNOSIS — F329 Major depressive disorder, single episode, unspecified: Secondary | ICD-10-CM

## 2012-01-23 DIAGNOSIS — F1020 Alcohol dependence, uncomplicated: Secondary | ICD-10-CM

## 2012-01-23 LAB — COMPREHENSIVE METABOLIC PANEL
Albumin: 3.9 g/dL (ref 3.5–5.2)
BUN: 11 mg/dL (ref 6–23)
Creatinine, Ser: 0.73 mg/dL (ref 0.50–1.35)
GFR calc Af Amer: >90 mL/min (ref >90)
Glucose, Bld: 95 mg/dL (ref 70–99)
Sodium: 138 mEq/L (ref 135–145)
Total Bilirubin: 0.6 mg/dL (ref 0.3–1.2)
Total Protein: 6.8 g/dL (ref 6.0–8.3)

## 2012-01-23 LAB — CBC
HCT: 43.5 % (ref 39.0–52.0)
MCV: 92.8 fL (ref 78.0–100.0)
Platelets: 154 10*3/uL (ref 150–400)
RBC: 4.69 MIL/uL (ref 4.22–5.81)
WBC: 7.1 10*3/uL (ref 4.0–10.5)

## 2012-01-23 MED ORDER — VITAMIN B-1 100 MG PO TABS
100.0000 mg | ORAL_TABLET | Freq: Every day | ORAL | Status: DC
  Administered 2012-01-24: 100 mg via ORAL
  Filled 2012-01-23: qty 1

## 2012-01-23 MED ORDER — LORAZEPAM 2 MG/ML IJ SOLN: 1.0000 mg | Freq: Four times a day (QID) | INTRAMUSCULAR | Status: DC | PRN

## 2012-01-23 MED ORDER — FOLIC ACID 1 MG PO TABS: 1.0000 mg | ORAL_TABLET | Freq: Every day | ORAL | Status: DC

## 2012-01-23 MED ORDER — THIAMINE HCL 100 MG/ML IJ SOLN: 100.0000 mg | Freq: Every day | INTRAMUSCULAR | Status: DC

## 2012-01-23 MED ORDER — ADULT MULTIVITAMIN W/MINERALS CH: 1.0000 | ORAL_TABLET | Freq: Every day | ORAL | Status: DC

## 2012-01-23 MED ORDER — LORAZEPAM 1 MG PO TABS
1.0000 mg | ORAL_TABLET | Freq: Four times a day (QID) | ORAL | Status: DC | PRN
  Administered 2012-01-23: 1 mg via ORAL
  Filled 2012-01-23: qty 1

## 2012-01-23 MED ORDER — FLUOXETINE HCL 20 MG PO CAPS
20.0000 mg | ORAL_CAPSULE | Freq: Every day | ORAL | Status: DC
  Administered 2012-01-23 – 2012-01-24 (×2): 20 mg via ORAL
  Filled 2012-01-23 (×2): qty 1

## 2012-01-23 MED ORDER — METOPROLOL TARTRATE 50 MG PO TABS
50.0000 mg | ORAL_TABLET | Freq: Two times a day (BID) | ORAL | Status: DC
  Administered 2012-01-23 – 2012-01-24 (×2): 50 mg via ORAL
  Filled 2012-01-23 (×3): qty 1

## 2012-01-23 MED ORDER — METOPROLOL TARTRATE 25 MG PO TABS
25.0000 mg | ORAL_TABLET | Freq: Once | ORAL | Status: AC
  Administered 2012-01-23: 25 mg via ORAL
  Filled 2012-01-23: qty 1

## 2012-01-23 MED ORDER — LORAZEPAM 1 MG PO TABS: 0.0000 mg | ORAL_TABLET | Freq: Two times a day (BID) | ORAL | Status: DC

## 2012-01-23 MED ORDER — LORAZEPAM 1 MG PO TABS
0.0000 mg | ORAL_TABLET | Freq: Four times a day (QID) | ORAL | Status: DC
  Administered 2012-01-23: 1 mg via ORAL
  Administered 2012-01-23: 2 mg via ORAL
  Administered 2012-01-24: 4 mg via ORAL
  Administered 2012-01-24: 2 mg via ORAL
  Filled 2012-01-23 (×2): qty 2
  Filled 2012-01-23: qty 4
  Filled 2012-01-23: qty 1

## 2012-01-23 NOTE — Clinical Social Work Psychosocial (Signed)
     Clinical Social Work Department BRIEF PSYCHOSOCIAL ASSESSMENT 01/23/2012  Patient:  Mark [name], Mark [name]     Account Number:  [id number]     Admit date:  01/19/2012  Clinical Social Worker:  Margaree Mackintosh  Date/Time:  01/23/2012 10:01 AM  Referred by:  Physician  Date Referred:  01/23/2012 Referred for  Substance Abuse   Other Referral:   Interview type:  Patient Other interview type:    PSYCHOSOCIAL DATA Living Status:  FAMILY Admitted from facility:   Level of care:   Primary support name:  Jesse Sans; [phone] Primary support relationship to patient:  PARENT Degree of support available:   Unknown.    CURRENT CONCERNS Current Concerns  Substance Abuse   Other Concerns:    SOCIAL WORK ASSESSMENT / PLAN Clinical Social Worker recieved referral for "Current Substance Abuse".  CSW reviewed chart and met with pt at bedside.  CSW introduced self, explained role, and provided support. CSW provided resource information and opportunity for pt to address qeustions/concerns.  Pt states, "I'm ready to quit this junk".  Pt shared he plans on arranging outpatient treatment with resourece information.  CSW to sign off at this time, please re consult if needed.   Assessment/plan status:  Information/Referral to [facility] Other assessment/ plan:   Information/referral to community resources:   Substance Abuse.    PATIENTS/FAMILYS RESPONSE TO PLAN OF CARE: Pt was pleasant and engaged.  Pt stated he was willing to participate in resource information.

## 2012-01-23 NOTE — Progress Notes (Signed)
[facility] Progress Note [place] TEAM 1 - Stepdown/ICU TEAM   TERRELL OSTRAND ZOX:[id number] DOB: Oct 12, 1965 DOA: 01/19/2012 PCP: Lenora Boys, MD  Brief narrative: 46 year old patient with known hypertension and dyslipidemia who does not take his medications. Patient reports he discontinued his medications himself. He also has an underlying history of alcoholism. He presented to the [facility] emergency department because of chest pain and tachycardia palpitations. In the emergency department he was found to have frequent runs of PVCs and nonsustained ventricular tachycardia. Otherwise initial cardiac isoenzymes and EKG were nonischemic. Chest x-ray was also unremarkable. Cardiology was consulted and ordered an amiodarone drip. In addition to the above patient endorsed utilizing methadone for chronic back pain. He also complained of periumbilical pain for several days. Because of concerns patient might need to undergo cardiac catheterization patient was subsequently transferred to [facility] and was admitted to the step down unit for further monitoring and treatment.  Assessment/Plan:  Ventricular tachycardia/Chest pain *Management per cardiology/Dr. 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 tach could primarily be mediated by persistent tachycardia *Cardiology is considering at a later date the possibility of a  defibrillator if the patient is committed to alcohol cessation and will also need a followup echocardiogram to confirm EF is still low *ACE inhibitor started 11/13  Alcohol abuse/mild transaminitis/ Fatty liver *His blood alcohol level was 270 at presentation  *Continue CIWA protocol-scores today have been between 10 and 15.  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 Social work and case management consultation for discharge planning.  Consultants: Cardiology  Procedures: None  Antibiotics: None  HPI/Subjective: Complained of ongoing anxiety and tremulousness. Was given oxy IR and Ativan overnight and now feels unsteady on his feet. States feels like he has chronic depression. Also has and awareness of tachypalpitations   Objective: Blood pressure 128/87, pulse 72, temperature 98.3 F (36.8 C), temperature source Oral, resp. rate 16, height 6\' 4"  (1.93 m), weight 87.952 kg (193 lb 14.4 oz), SpO2 98.00%.  Intake/Output Summary (Last 24 hours) at 01/23/12 1119 Last data filed at 01/23/12 8119  Gross per 24 hour  Intake    303 ml  Output      0 ml  Net    303 ml     Exam: General: No acute respiratory distress Lungs: Clear to auscultation bilaterally without wheezes or crackles, room air Cardiovascular: Iregular rate and rhythm without murmur gallop or  rub - no peripheral edema noted Abdomen: Nontender, nondistended, soft, bowel  sounds positive, no rebound, no ascites, no appreciable mass Musculoskeletal: No significant cyanosis, clubbing of bilateral lower extremities Neurological: Patient is alert and moves all extremities  Data Reviewed: Basic Metabolic Panel:  Lab 01/23/12 4098 01/21/12 0905 01/20/12 0540 01/19/12 1905  NA 138 138 140 139  K 3.8 3.6 3.8 3.1*  CL 99 98 99 96  CO2 29 27 27 28   GLUCOSE 95 96 97 130*  BUN 11 11 8 8   CREATININE 0.73 0.68 0.64 0.71  CALCIUM 9.9 9.7 9.1 9.6  MG -- -- -- 2.0  PHOS -- -- -- --   Liver Function Tests:  Lab 01/23/12 0609 01/21/12 0905 01/20/12 0540 01/19/12 1905  AST 60* 65* 62* 78*  ALT 55* 50 46 54*  ALKPHOS 84 97 90 98  BILITOT 0.6 1.3* 0.6 0.4  PROT 6.8 7.2 6.9 7.5  ALBUMIN 3.9 4.0 4.0 4.4    Lab 01/19/12 2146  LIPASE 30  AMYLASE --   CBC:  Lab 01/23/12 0609 01/21/12 0905 01/20/12 0540 01/19/12 1905  WBC 7.1 7.0 6.4 4.3  NEUTROABS -- -- 4.3 --  HGB 15.0 16.0 15.0 15.8  HCT 43.5 44.9 43.5 44.9  MCV 92.8 91.1 92.9 92.0  PLT 154 132* 134* 154   Cardiac Enzymes:  Lab 01/22/12 1523 01/22/12 0834 01/22/12 0246 01/21/12 2120 01/21/12 1634 01/20/12 0540 01/19/12 2146  CKTOTAL 201 238* 251* 149 139 -- --  CKMB 2.5 2.9 3.2 2.7 2.8 -- --  CKMBINDEX -- -- -- -- -- -- --  TROPONINI -- -- -- -- -- <0.30 <0.30   BNP (last 3 results)  Basename 01/19/12 1905 12/09/11 1655  PROBNP 135.3* 110.2   CBG:  Lab 01/20/12 1211 01/20/12 0815 01/20/12 0432  GLUCAP 110* 103* 109*    Recent Results (from the past 240 hour(s))  MRSA PCR SCREENING     Status: Normal   Collection Time   01/20/12  1:44 AM      Component Value Range Status Comment   MRSA by PCR NEGATIVE  NEGATIVE Final      Studies:  Recent x-ray studies have been reviewed in detail by the Attending Physician  Scheduled Meds:  Reviewed in detail by the Attending Physician   Junious Silk, ANP [facility] Office  [phone] Pager [phone]  On-Call/Text Page:      [email or web address]      password [email or web address]  If 7PM-7AM, please contact night-coverage [email or web address] Password [email or web address] 01/23/2012, 11:19 AM   LOS: 4 days    I have examined the patient, reviewed the chart and agree with the above note. Note that Fluoxetine started by psych.   Calvert Cantor, MD [phone]

## 2012-01-23 NOTE — Progress Notes (Signed)
At 0501 pt had 9 beats of V tach. Pt asleep at the time. Mark Poag, NP notified and strip posted. No new orders and will cont to monitor.

## 2012-01-23 NOTE — Consult Note (Signed)
Reason for Consult: Severe depression and alcohol dependence Referring Physician: Dr. Dominga Ferry Mark [name] is an 46 y.o. male.  HPI: Patient was seen and chart reviewed. Patient was admitted to medical floor for generalized chest pain, PVCs and NSVT.Patient has been suffering with depression, anxiety and alcohol dependence over few years. Patient has been sad, isolated, tearful, disturbed sleep and appetite. He has multiple psychosocial stresses including separation from wife and children. He has hard time finding jobs x 8 months and financial stress. He has been selling his tools to buy alcohol. He has one episode of alcohol withdrawal seizure about 3 weeks ago. His father and grandpa were alcoholic and his grandpa was suffered with cirrhosis of liver. He was poorly managed his health problems.   MSE: patient has depressed mood, and dysphoric and anxious affect. He has normal speech except low volume of speech. He denied SI/HI. He has no evidence of psychosis. He has poor insight, judgment and impulse control.  Past Medical History  Diagnosis Date  . Hypertension   . Hyperlipidemia   . 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 ETOH abuse   . GERD (gastroesophageal reflux disease)   . Mental disorder     Past Surgical History  Procedure Date  . Back surgery   . Neck surgery   . Fundoplasty transthoracic 2003  . Nasal sinus surgery     Family History  Problem Relation Age of Onset  . Breast cancer Paternal Grandmother   . Lung cancer      was a smoker  . Hypertension Mother     Social History:  reports that he has been smoking Cigarettes.  He has a 60 pack-year smoking history. He has never used smokeless tobacco. He reports that he drinks about 4.2 ounces of alcohol per week. He reports that he uses illicit drugs.  Allergies:  Allergies  Allergen Reactions  . Lisinopril Anaphylaxis and Swelling    Medications: I have reviewed the  patient's current medications.  Results for orders placed during the hospital encounter of 01/19/12 (from the past 48 hour(s))  CK TOTAL AND CKMB     Status: Normal   Collection Time   01/21/12  9:20 PM      Component Value Range Comment   Total CK 149  7 - 232 U/L    CK, MB 2.7  0.3 - 4.0 ng/mL    Relative Index 1.8  0.0 - 2.5   CK TOTAL AND CKMB     Status: Abnormal   Collection Time   01/22/12  2:46 AM      Component Value Range Comment   Total CK 251 (*) 7 - 232 U/L    CK, MB 3.2  0.3 - 4.0 ng/mL    Relative Index 1.3  0.0 - 2.5   CK TOTAL AND CKMB     Status: Abnormal   Collection Time   01/22/12  8:34 AM      Component Value Range Comment   Total CK 238 (*) 7 - 232 U/L    CK, MB 2.9  0.3 - 4.0 ng/mL    Relative Index 1.2  0.0 - 2.5   CK TOTAL AND CKMB     Status: Normal   Collection Time   01/22/12  3:23 PM      Component Value Range Comment   Total CK 201  7 - 232 U/L    CK, MB  2.5  0.3 - 4.0 ng/mL    Relative Index 1.2  0.0 - 2.5   COMPREHENSIVE METABOLIC PANEL     Status: Abnormal   Collection Time   01/23/12  6:09 AM      Component Value Range Comment   Sodium 138  135 - 145 mEq/L    Potassium 3.8  3.5 - 5.1 mEq/L    Chloride 99  96 - 112 mEq/L    CO2 29  19 - 32 mEq/L    Glucose, Bld 95  70 - 99 mg/dL    BUN 11  6 - 23 mg/dL    Creatinine, Ser 1.61  0.50 - 1.35 mg/dL    Calcium 9.9  8.4 - 09.6 mg/dL    Total Protein 6.8  6.0 - 8.3 g/dL    Albumin 3.9  3.5 - 5.2 g/dL    AST 60 (*) 0 - 37 U/L    ALT 55 (*) 0 - 53 U/L    Alkaline Phosphatase 84  39 - 117 U/L    Total Bilirubin 0.6  0.3 - 1.2 mg/dL    GFR calc non Af Amer >90  >90 mL/min    GFR calc Af Amer >90  >90 mL/min   CBC     Status: Normal   Collection Time   01/23/12  6:09 AM      Component Value Range Comment   WBC 7.1  4.0 - 10.5 K/uL    RBC 4.69  4.22 - 5.81 MIL/uL    Hemoglobin 15.0  13.0 - 17.0 g/dL    HCT 04.5  40.9 - 81.1 %    MCV 92.8  78.0 - 100.0 fL    MCH 32.0  26.0 - 34.0 pg     MCHC 34.5  30.0 - 36.0 g/dL    RDW 91.4  78.2 - 95.6 %    Platelets 154  150 - 400 K/uL     No results found.  Positive for anxiety, bad mood, depression, excessive alcohol consumption, sleep disturbance and tobacco use Blood pressure 122/75, pulse 58, temperature 97.7 F (36.5 C), temperature source Oral, resp. rate 16, height 6\' 4"  (1.93 m), weight 193 lb 14.4 oz (87.952 kg), SpO2 97.00%.   Assessment/Plan: Alcohol dependence Major depressive disorder  Recommend antidepressant medication Fluoxetine 20 mg  PO Qam for depression and may adjusted to higher dose if needed and tolerated. He benefits from therapeutic counseling. He does not required detox treatment. He will be referred to alcohol/substance rehab services at [facility]. Contact social service regarding rehab services when he was medically stable. Please contact psych consult as needed if further assistance needed.   Ambri Miltner,JANARDHAHA R. 01/23/2012, 5:56 PM

## 2012-01-24 MED ORDER — LOSARTAN POTASSIUM 50 MG PO TABS: 50.0000 mg | ORAL_TABLET | Freq: Every day | ORAL | Status: DC

## 2012-01-24 MED ORDER — METOPROLOL TARTRATE 50 MG PO TABS: 50.0000 mg | ORAL_TABLET | Freq: Two times a day (BID) | ORAL | Status: DC

## 2012-01-24 MED ORDER — FLUOXETINE HCL 20 MG PO CAPS: 20.0000 mg | ORAL_CAPSULE | Freq: Every day | ORAL | Status: DC

## 2012-01-24 MED ORDER — ASPIRIN 325 MG PO TBEC: 325.0000 mg | DELAYED_RELEASE_TABLET | Freq: Every day | ORAL | Status: DC

## 2012-01-24 NOTE — Discharge Summary (Signed)
DISCHARGE SUMMARY  Mark [name]  MR#: [id number]  DOB:[birth year]  Date of Admission: 01/19/2012 Date of Discharge: 01/24/2012  Attending Physician:Braydee Shimkus Silvestre Gunner, MD  Patient's NGE:[name], Mark Cheadle, MD  Consults: Nehemiah Settle, MD/Psychiatry Rinaldo Cloud, MD/Cardiology  Pertinent discharge information: ACE inhibitor ordered by cardiology was changed to ARB by pharmacy because of known anaphylactic reaction in the past to ACE inhibitor - pt has tolerated ARB as inpatient w/o difficulty.  Patient has agreed to continue outpatient alcohol treatment and resources have been provided to the patient by our social work department.  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  Going to the alcohol withdrawal.  CT ABDOMEN AND PELVIS WITHOUT CONTRAST  Technique:  Multidetector CT imaging of the abdomen and pelvis was performed following the standard protocol without intravenous contrast.  Comparison: The the lung bases show minimal subsegmental atelectasis but are otherwise clear.  The heart is normal in size.  IMPRESSION: No acute findings.  Extensive fatty infiltration of the liver.  Postsurgical changes at L5-S1.   Original Report Authenticated By: Amie Portland, M.D.    Nm Myocar Multi W/spect W/wall Motion / Ef  01/21/2012  *RADIOLOGY REPORT*  Clinical Data:  Chest pain.  Tobacco abuse.  Technique:  Standard myocardial SPECT imaging performed after resting intravenous injection of 10 mCi Tc-79m sestamibi. Subsequently, intravenous infusion of Lexiscan performed under the supervision of the Cardiology staff.  At peak effect of the drug, 30 mCi Tc-78msestamibi  injected intravenously and standard myocardial SPECT  imaging performed.  Quantitative gated imaging also performed to evaluate left ventricular wall motion and estimate left ventricular ejection fraction.  Comparison:  12/09/2011  MYOCARDIAL IMAGING WITH SPECT (REST AND PHARMACOLOGIC-STRESS) IMPRESSION:  1.  No evidence for pharmacologically induced myocardial reversibility. 2.  Mild fixed defect involves the left ventricular apex and apical and mid segments of the inferior wall. 3.  Significantly diminished left ventricular systolic function. The left ventricular ejection fraction equals 25%.   Original Report Authenticated By: Signa Kell, M.D.    Dg Chest Port 1 View  01/19/2012  *RADIOLOGY REPORT*  Clinical Data: 46 year old male tachycardia.  PORTABLE CHEST - 1 VIEW  Comparison: Chest CTA 12/09/2011 and earlier.  IMPRESSION: No acute cardiopulmonary abnormality.   Original Report Authenticated By: Erskine Speed, M.D.     Laboratory: Results for orders placed during the hospital encounter of 01/19/12 (from the past 48 hour(s))  CK TOTAL AND CKMB     Status: Normal   Collection Time   01/22/12  3:23 PM      Component Value Range Comment   Total CK 201  7 - 232 U/L    CK, MB 2.5  0.3 - 4.0 ng/mL    Relative Index 1.2  0.0 - 2.5   COMPREHENSIVE METABOLIC PANEL     Status: Abnormal   Collection Time   01/23/12  6:09 AM      Component Value Range Comment   Sodium 138  135 - 145 mEq/L    Potassium 3.8  3.5 - 5.1 mEq/L    Chloride 99  96 - 112 mEq/L    CO2 29  19 - 32 mEq/L    Glucose, Bld 95  70 - 99 mg/dL    BUN 11  6 - 23 mg/dL  Creatinine, Ser 0.73  0.50 - 1.35 mg/dL    Calcium 9.9  8.4 - 40.9 mg/dL    Total Protein 6.8  6.0 - 8.3 g/dL    Albumin 3.9  3.5 - 5.2 g/dL    AST 60 (*) 0 - 37 U/L    ALT 55 (*) 0 - 53 U/L    Alkaline Phosphatase 84  39 - 117 U/L    Total Bilirubin 0.6  0.3 - 1.2 mg/dL    GFR calc non Af Amer >90  >90 mL/min    GFR calc Af Amer >90  >90 mL/min   CBC     Status: Normal   Collection Time   01/23/12  6:09  AM      Component Value Range Comment   WBC 7.1  4.0 - 10.5 K/uL    RBC 4.69  4.22 - 5.81 MIL/uL    Hemoglobin 15.0  13.0 - 17.0 g/dL    HCT 81.1  91.4 - 78.2 %    MCV 92.8  78.0 - 100.0 fL    MCH 32.0  26.0 - 34.0 pg    MCHC 34.5  30.0 - 36.0 g/dL    RDW 95.6  21.3 - 08.6 %    Platelets 154  150 - 400 K/uL       Medication List     As of 01/24/2012 11:14 AM    TAKE these medications         aspirin 325 MG EC tablet   Take 1 tablet (325 mg total) by mouth daily.      FLUoxetine 20 MG capsule   Commonly known as: PROZAC   Take 1 capsule (20 mg total) by mouth daily.      losartan 50 MG tablet   Commonly known as: COZAAR   Take 1 tablet (50 mg total) by mouth daily.      methadone 10 MG tablet   Commonly known as: DOLOPHINE   Take 10-20 mg by mouth daily.      metoprolol 50 MG tablet   Commonly known as: LOPRESSOR   Take 1 tablet (50 mg total) by mouth 2 (two) times daily.       History of present illness: 46 year old patient with known hypertension and dyslipidemia who does not take his medications. Patient reports he discontinued his medications himself. He also has an underlying history of alcoholism. He presented to the [facility] emergency department because of chest pain and tachycardia palpitations. In the emergency department he was found to have frequent runs of PVCs and nonsustained ventricular tachycardia. Otherwise initial cardiac isoenzymes and EKG were nonischemic. Chest x-ray was also unremarkable. Cardiology was consulted and ordered an amiodarone drip. In addition to the above patient endorsed utilizing methadone for chronic back pain. He also complained of periumbilical pain for several days. Because of concerns patient might need to undergo cardiac catheterization patient was subsequently transferred to [facility] and was admitted to the step down unit for further monitoring and treatment.  Hospital Course:  Ventricular tachycardia/Chest pain Patient  was expressing chest pain in the setting of ventricular tachycardia. Cardiology was consulted. Cardiac isoenzymes, EKG, and Lexiscan stress test were negative for any ischemia. Patient's ventricular arrhythmia was initially suppressed with amiodarone infusion. This was later discontinued in favor of beta blockers for rate control. 24 hours prior to discharge patient still having resting tachycardia and had awareness of tachypalpitations so the beta blocker was increased from 25-50 mg twice a day and on  the date of discharge heart rate was well-controlled in the 50-60 range at rest. Patient still has intermittent nonsustained ventricular tachycardia with about 5-7 beats documented on telemetry.  Severe systolic dysfunction EF 25% on Lexiscan During the patient's stress test his EF was found to be markedly decreased at 25%. It was felt that the systolic dysfunction was directly related to patient's ongoing alcohol abuse prior to admission and possibly exacerbated by his persistent tachycardia which apparently have been present for sometime before admission. The cardiologist is considering the possibility of a defibrillator in the future but only if the patient is committed to alcohol cessation and if his CM does not improve w/ EtOH abstinence. Plans are to proceed with a followup echocardiogram after appropriate medical treatment/absitnence in the outpatient setting. He was started on an ACE inhibitor on 01/22/2012 but was later found to have a documented history of "anaphylaxis" to this class of medications so pharmacy changed him to an ARB/Cozaar and he will discharge home on this medication.  Alcohol abuse/mild transaminitis/ Fatty liver Patient's blood alcohol level was 270 at presentation. He successfully completed a detoxification under the CIWA protocol. 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 He was evaluated by psychiatry. He was started on Prozac this admission. Recommendations are for patient to followup as an outpatient for additional monitoring and treatment of this problem.  HTN (hypertension) His blood pressure remains well controlled with the addition of metoprolol this admission. In the past he has been on Cozaar and this was resumed this hospitalization-see above.  Day of Discharge BP 125/81  Pulse 65  Temp 98.3 F (36.8 C) (Oral)  Resp 20  Ht 6\' 4"  (1.93 m)  Wt 87.181 kg (192 lb 3.2 oz)  BMI 23.40 kg/m2  SpO2 99%  Physical Exam: General: No acute respiratory distress  Lungs: Clear to auscultation bilaterally without wheezes or crackles, room air  Cardiovascular: Regular rate and rhythm without murmur gallop or rub - no peripheral edema noted  Abdomen: Nontender, nondistended, soft, bowel sounds positive, no rebound, no ascites, no appreciable mass  Musculoskeletal: No significant cyanosis, clubbing of bilateral lower extremities  Neurological: Patient is alert and moves all extremities  Follow-up: Call Dr. Annitta Jersey office to be seen in one week for hospital follow up  Contact [facility] to establish outpatient alcohol addiction treatment plan and follow  Disposition:  Discharge to home with parents  Junious Silk, ANP pager [phone]  I have personally examined this patient and reviewed the entire database. I have reviewed the above note, made any necessary editorial changes, and agree with its content.  Lonia Blood, MD [facility]

## 2012-01-24 NOTE — Progress Notes (Signed)
CSW received referral for psych assessment today and found patient had discharged upon going to patient's room at 1:30 pm Carney Bern, [facility] Clinical Social Worker [phone]

## 2012-03-07 ENCOUNTER — Other Ambulatory Visit: Payer: Self-pay

## 2012-03-07 ENCOUNTER — Inpatient Hospital Stay (HOSPITAL_COMMUNITY)
Admission: EM | Admit: 2012-03-07 | Discharge: 2012-03-12 | DRG: 897 | Disposition: A | Discharge status: Home / Self Care | Payer: No Typology Code available for payment source | Attending: Internal Medicine | Admitting: Internal Medicine

## 2012-03-07 ENCOUNTER — Encounter (HOSPITAL_COMMUNITY): Payer: Self-pay | Admitting: *Deleted

## 2012-03-07 ENCOUNTER — Emergency Department (HOSPITAL_COMMUNITY): Payer: Self-pay

## 2012-03-07 DIAGNOSIS — Z720 Tobacco use: Secondary | ICD-10-CM | POA: Diagnosis present

## 2012-03-07 DIAGNOSIS — R109 Unspecified abdominal pain: Secondary | ICD-10-CM

## 2012-03-07 DIAGNOSIS — Z7982 Long term (current) use of aspirin: Secondary | ICD-10-CM

## 2012-03-07 DIAGNOSIS — R0602 Shortness of breath: Secondary | ICD-10-CM

## 2012-03-07 DIAGNOSIS — K219 Gastro-esophageal reflux disease without esophagitis: Secondary | ICD-10-CM | POA: Diagnosis present

## 2012-03-07 DIAGNOSIS — M542 Cervicalgia: Secondary | ICD-10-CM | POA: Diagnosis present

## 2012-03-07 DIAGNOSIS — I426 Alcoholic cardiomyopathy: Secondary | ICD-10-CM | POA: Diagnosis present

## 2012-03-07 DIAGNOSIS — M549 Dorsalgia, unspecified: Secondary | ICD-10-CM | POA: Diagnosis present

## 2012-03-07 DIAGNOSIS — F10239 Alcohol dependence with withdrawal, unspecified: Principal | ICD-10-CM

## 2012-03-07 DIAGNOSIS — F1120 Opioid dependence, uncomplicated: Secondary | ICD-10-CM | POA: Diagnosis present

## 2012-03-07 DIAGNOSIS — I10 Essential (primary) hypertension: Secondary | ICD-10-CM | POA: Diagnosis present

## 2012-03-07 DIAGNOSIS — F1010 Alcohol abuse, uncomplicated: Secondary | ICD-10-CM | POA: Diagnosis present

## 2012-03-07 DIAGNOSIS — E785 Hyperlipidemia, unspecified: Secondary | ICD-10-CM | POA: Diagnosis present

## 2012-03-07 DIAGNOSIS — Z79899 Other long term (current) drug therapy: Secondary | ICD-10-CM

## 2012-03-07 DIAGNOSIS — F1020 Alcohol dependence, uncomplicated: Secondary | ICD-10-CM | POA: Diagnosis present

## 2012-03-07 DIAGNOSIS — I472 Ventricular tachycardia: Secondary | ICD-10-CM

## 2012-03-07 DIAGNOSIS — F10939 Alcohol use, unspecified with withdrawal, unspecified: Principal | ICD-10-CM | POA: Diagnosis present

## 2012-03-07 DIAGNOSIS — Z77120 Contact with and (suspected) exposure to mold (toxic): Secondary | ICD-10-CM

## 2012-03-07 DIAGNOSIS — F39 Unspecified mood [affective] disorder: Secondary | ICD-10-CM

## 2012-03-07 DIAGNOSIS — R0789 Other chest pain: Secondary | ICD-10-CM | POA: Diagnosis present

## 2012-03-07 DIAGNOSIS — F17200 Nicotine dependence, unspecified, uncomplicated: Secondary | ICD-10-CM | POA: Diagnosis present

## 2012-03-07 DIAGNOSIS — G8929 Other chronic pain: Secondary | ICD-10-CM | POA: Diagnosis present

## 2012-03-07 HISTORY — DX: Cardiac arrhythmia, unspecified: I49.9

## 2012-03-07 LAB — URINALYSIS, ROUTINE W REFLEX MICROSCOPIC
Leukocytes, UA: NEGATIVE
Protein, ur: NEGATIVE mg/dL
Specific Gravity, Urine: 1.011 (ref 1.005–1.030)
Urobilinogen, UA: 1 mg/dL (ref 0.0–1.0)

## 2012-03-07 LAB — RAPID URINE DRUG SCREEN, HOSP PERFORMED
Cocaine: NOT DETECTED
Opiates: NOT DETECTED

## 2012-03-07 LAB — COMPREHENSIVE METABOLIC PANEL
AST: 48 U/L — ABNORMAL HIGH (ref 0–37)
CO2: 29 mEq/L (ref 19–32)
Calcium: 9.7 mg/dL (ref 8.4–10.5)
Creatinine, Ser: 0.6 mg/dL (ref 0.50–1.35)
GFR calc Af Amer: >90 mL/min (ref >90)
GFR calc non Af Amer: >90 mL/min (ref >90)
Glucose, Bld: 105 mg/dL — ABNORMAL HIGH (ref 70–99)
Sodium: 137 mEq/L (ref 135–145)
Total Protein: 7.8 g/dL (ref 6.0–8.3)

## 2012-03-07 LAB — CBC WITH DIFFERENTIAL/PLATELET
Basophils Absolute: 0 10*3/uL (ref 0.0–0.1)
Eosinophils Absolute: 0.3 10*3/uL (ref 0.0–0.7)
Eosinophils Relative: 8 % — ABNORMAL HIGH (ref 0–5)
HCT: 41.3 % (ref 39.0–52.0)
Lymphocytes Relative: 19 % (ref 12–46)
MCH: 31 pg (ref 26.0–34.0)
MCV: 90.2 fL (ref 78.0–100.0)
Monocytes Absolute: 0.4 10*3/uL (ref 0.1–1.0)
Platelets: 159 10*3/uL (ref 150–400)
RDW: 13.2 % (ref 11.5–15.5)
WBC: 4.4 10*3/uL (ref 4.0–10.5)

## 2012-03-07 LAB — TROPONIN I: Troponin I: <0.3 ng/mL (ref <0.30)

## 2012-03-07 LAB — POCT I-STAT TROPONIN I: Troponin i, poc: 0 ng/mL (ref 0.00–0.08)

## 2012-03-07 MED ORDER — LORAZEPAM 1 MG PO TABS: 1.0000 mg | ORAL_TABLET | Freq: Four times a day (QID) | ORAL | Status: DC | PRN

## 2012-03-07 MED ORDER — LORAZEPAM 2 MG/ML IJ SOLN
1.0000 mg | Freq: Once | INTRAMUSCULAR | Status: AC
  Administered 2012-03-07: 1 mg via INTRAVENOUS
  Filled 2012-03-07: qty 1

## 2012-03-07 MED ORDER — SODIUM CHLORIDE 0.9 % IJ SOLN
3.0000 mL | Freq: Two times a day (BID) | INTRAMUSCULAR | Status: DC
  Administered 2012-03-10: 3 mL via INTRAVENOUS

## 2012-03-07 MED ORDER — ADULT MULTIVITAMIN W/MINERALS CH
1.0000 | ORAL_TABLET | Freq: Every day | ORAL | Status: DC
  Administered 2012-03-07 – 2012-03-12 (×6): 1 via ORAL
  Filled 2012-03-07 (×6): qty 1

## 2012-03-07 MED ORDER — ALBUTEROL SULFATE (5 MG/ML) 0.5% IN NEBU: 2.5000 mg | INHALATION_SOLUTION | RESPIRATORY_TRACT | Status: DC | PRN

## 2012-03-07 MED ORDER — HEPARIN SODIUM (PORCINE) 5000 UNIT/ML IJ SOLN
5000.0000 [IU] | Freq: Three times a day (TID) | INTRAMUSCULAR | Status: DC
  Administered 2012-03-07 – 2012-03-12 (×14): 5000 [IU] via SUBCUTANEOUS
  Filled 2012-03-07 (×17): qty 1

## 2012-03-07 MED ORDER — SODIUM CHLORIDE 0.9 % IV BOLUS (SEPSIS)
1000.0000 mL | Freq: Once | INTRAVENOUS | Status: AC
  Administered 2012-03-07: 1000 mL via INTRAVENOUS

## 2012-03-07 MED ORDER — FLUOXETINE HCL 20 MG PO CAPS
20.0000 mg | ORAL_CAPSULE | Freq: Every day | ORAL | Status: DC
  Administered 2012-03-07 – 2012-03-12 (×6): 20 mg via ORAL
  Filled 2012-03-07 (×6): qty 1

## 2012-03-07 MED ORDER — FOLIC ACID 1 MG PO TABS
1.0000 mg | ORAL_TABLET | Freq: Once | ORAL | Status: AC
  Administered 2012-03-07: 1 mg via ORAL
  Filled 2012-03-07: qty 1

## 2012-03-07 MED ORDER — METHADONE HCL 10 MG PO TABS
20.0000 mg | ORAL_TABLET | Freq: Every day | ORAL | Status: DC
  Administered 2012-03-08 – 2012-03-12 (×5): 20 mg via ORAL
  Filled 2012-03-07 (×3): qty 2
  Filled 2012-03-07 (×2): qty 1
  Filled 2012-03-07: qty 2

## 2012-03-07 MED ORDER — LORAZEPAM 2 MG/ML IJ SOLN
0.0000 mg | Freq: Two times a day (BID) | INTRAMUSCULAR | Status: DC
  Filled 2012-03-07: qty 2

## 2012-03-07 MED ORDER — LORAZEPAM 2 MG/ML IJ SOLN
0.0000 mg | Freq: Four times a day (QID) | INTRAMUSCULAR | Status: AC
  Administered 2012-03-07: 2 mg via INTRAVENOUS
  Administered 2012-03-07: 1 mg via INTRAVENOUS
  Administered 2012-03-08 (×2): 2 mg via INTRAVENOUS
  Administered 2012-03-09: 4 mg via INTRAVENOUS
  Filled 2012-03-07: qty 1

## 2012-03-07 MED ORDER — OXYCODONE HCL 5 MG PO TABS
5.0000 mg | ORAL_TABLET | ORAL | Status: DC | PRN
  Administered 2012-03-07 – 2012-03-12 (×9): 5 mg via ORAL
  Filled 2012-03-07 (×9): qty 1

## 2012-03-07 MED ORDER — HYDROMORPHONE HCL PF 1 MG/ML IJ SOLN: 1.0000 mg | INTRAMUSCULAR | Status: DC | PRN

## 2012-03-07 MED ORDER — FOLIC ACID 1 MG PO TABS
1.0000 mg | ORAL_TABLET | Freq: Every day | ORAL | Status: DC
  Administered 2012-03-07 – 2012-03-12 (×6): 1 mg via ORAL
  Filled 2012-03-07 (×6): qty 1

## 2012-03-07 MED ORDER — LORAZEPAM 2 MG/ML IJ SOLN
INTRAMUSCULAR | Status: AC
  Filled 2012-03-07: qty 1

## 2012-03-07 MED ORDER — SODIUM CHLORIDE 0.9 % IV SOLN: INTRAVENOUS | Status: DC

## 2012-03-07 MED ORDER — THIAMINE HCL 100 MG/ML IJ SOLN: INTRAVENOUS | Status: DC

## 2012-03-07 MED ORDER — LORAZEPAM 1 MG PO TABS
1.0000 mg | ORAL_TABLET | Freq: Four times a day (QID) | ORAL | Status: DC | PRN
  Administered 2012-03-08 – 2012-03-09 (×5): 1 mg via ORAL
  Filled 2012-03-07 (×6): qty 1

## 2012-03-07 MED ORDER — LORAZEPAM 2 MG/ML IJ SOLN
1.0000 mg | Freq: Four times a day (QID) | INTRAMUSCULAR | Status: DC | PRN
  Administered 2012-03-08 (×2): 1 mg via INTRAVENOUS
  Filled 2012-03-07 (×5): qty 1

## 2012-03-07 MED ORDER — ASPIRIN EC 325 MG PO TBEC
325.0000 mg | DELAYED_RELEASE_TABLET | Freq: Every day | ORAL | Status: DC
  Administered 2012-03-08 – 2012-03-12 (×5): 325 mg via ORAL
  Filled 2012-03-07 (×5): qty 1

## 2012-03-07 MED ORDER — LORAZEPAM 1 MG PO TABS: 1.0000 mg | ORAL_TABLET | ORAL | Status: DC | PRN

## 2012-03-07 MED ORDER — LORAZEPAM 2 MG/ML IJ SOLN
1.0000 mg | INTRAMUSCULAR | Status: DC | PRN
  Administered 2012-03-07: 1 mg via INTRAVENOUS

## 2012-03-07 MED ORDER — VITAMIN B-1 100 MG PO TABS
100.0000 mg | ORAL_TABLET | Freq: Once | ORAL | Status: AC
  Administered 2012-03-07: 100 mg via ORAL
  Filled 2012-03-07: qty 1

## 2012-03-07 MED ORDER — ADULT MULTIVITAMIN W/MINERALS CH
1.0000 | ORAL_TABLET | Freq: Once | ORAL | Status: AC
  Administered 2012-03-07: 1 via ORAL
  Filled 2012-03-07: qty 1

## 2012-03-07 MED ORDER — LOSARTAN POTASSIUM 50 MG PO TABS
100.0000 mg | ORAL_TABLET | Freq: Every day | ORAL | Status: DC
  Administered 2012-03-07 – 2012-03-12 (×6): 100 mg via ORAL
  Filled 2012-03-07 (×6): qty 2

## 2012-03-07 MED ORDER — VITAMIN B-1 100 MG PO TABS
100.0000 mg | ORAL_TABLET | Freq: Every day | ORAL | Status: DC
  Administered 2012-03-08 – 2012-03-12 (×5): 100 mg via ORAL
  Filled 2012-03-07 (×6): qty 1

## 2012-03-07 MED ORDER — LORAZEPAM 2 MG/ML IJ SOLN
1.0000 mg | Freq: Four times a day (QID) | INTRAMUSCULAR | Status: DC | PRN
  Filled 2012-03-07: qty 1

## 2012-03-07 MED ORDER — METOPROLOL TARTRATE 50 MG PO TABS
50.0000 mg | ORAL_TABLET | Freq: Two times a day (BID) | ORAL | Status: DC
  Administered 2012-03-07 – 2012-03-12 (×10): 50 mg via ORAL
  Filled 2012-03-07 (×11): qty 1

## 2012-03-07 MED ORDER — THIAMINE HCL 100 MG/ML IJ SOLN
Freq: Once | INTRAVENOUS | Status: AC
  Administered 2012-03-07: 19:00:00 via INTRAVENOUS
  Filled 2012-03-07: qty 1000

## 2012-03-07 MED ORDER — ONDANSETRON HCL 4 MG/2ML IJ SOLN: 4.0000 mg | Freq: Three times a day (TID) | INTRAMUSCULAR | Status: DC | PRN

## 2012-03-07 MED ORDER — THIAMINE HCL 100 MG/ML IJ SOLN
100.0000 mg | Freq: Every day | INTRAMUSCULAR | Status: DC
  Filled 2012-03-07 (×6): qty 1

## 2012-03-07 NOTE — H&P (Addendum)
[facility] History and Physical  Mark [name]:[id number] DOB: [birth year] DOA: 03/07/2012  Referring physician: Arthor Captain, PA-C PCP: Lenora Boys, MD  Specialists: Rinaldo Cloud, MD  Chief Complaint: Alcohol withdrawal  HPI: Mark [name] is a 46 y.o. 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 Patient also has alcohol abuse, he drinks about a fifth of whiskey daily, he has history of DTs associated with seizure previously. Patient also has chronic back and neck pain for which she's using methadone. He came into the hospital complaining initially of chest pain. Patient was found to be in withdrawal from alcohol he'll be admitted to the hospital for further evaluation. Patient said he has right sided chest pain, is being chronic at least for the past year, he has marked worsening recently, the pain is constant, sharp, increases with arm movement. Is also associated with tingling and numbness of the right hand. Upon initial evaluation in the emergency department he has negative first set of cardiac enzymes, negative EKG for ischemia. He was found to h be diaphoretic, hypotensive, tachycardic and anxious consistent with alcohol withdrawal. He'll be admitted to the hospital for further evaluation.  Review of Systems:  Constitutional: Positive for fevers and sweats Eyes: negative for irritation, redness and visual disturbance Ears, nose, mouth, throat, and face: negative for earaches, epistaxis, nasal congestion and sore throat Respiratory: negative for cough, dyspnea on exertion, sputum and wheezing Cardiovascular: negative for chest pain, dyspnea, lower extremity edema, orthopnea, palpitations and syncope Gastrointestinal: negative for abdominal pain, constipation, diarrhea, melena, nausea and vomiting Genitourinary:negative for dysuria, frequency and hematuria Hematologic/lymphatic: negative for  bleeding, easy bruising and lymphadenopathy Musculoskeletal:negative for arthralgias, muscle weakness and stiff joints Neurological: negative for coordination problems, gait problems, headaches and weakness Endocrine: negative for diabetic symptoms including polydipsia, polyuria and weight loss Allergic/Immunologic: negative for anaphylaxis, hay fever and urticaria   Past Medical History  Diagnosis Date  . Hypertension   . Hyperlipidemia   . 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 ETOH abuse   . GERD (gastroesophageal reflux disease)   . Mental disorder   . Irregular heart beat    Past Surgical History  Procedure Date  . Back surgery   . Neck surgery   . Fundoplasty transthoracic 2003  . Nasal sinus surgery    Social History:  reports that he has been smoking Cigarettes.  He has a 60 pack-year smoking history. He has never used smokeless tobacco. He reports that he drinks about 4.2 ounces of alcohol per week. He reports that he uses illicit drugs.   Allergies  Allergen Reactions  . Lisinopril Anaphylaxis and Swelling    Family History  Problem Relation Age of Onset  . Breast cancer Paternal Grandmother   . Lung cancer      was a smoker  . Hypertension Mother    Prior to Admission medications   Medication Sig Start Date End Date Taking? Authorizing Provider  aspirin EC 325 MG EC tablet Take 1 tablet (325 mg total) by mouth daily. 01/24/12  Yes Russella Dar, NP  FLUoxetine (PROZAC) 20 MG capsule Take 1 capsule (20 mg total) by mouth daily. 01/24/12  Yes Russella Dar, NP  losartan (COZAAR) 100 MG tablet Take 100 mg by mouth daily.   Yes Historical Provider, MD  methadone (DOLOPHINE) 10 MG tablet Take 20 mg by mouth daily.  Yes Historical Provider, MD  metoprolol (LOPRESSOR) 50 MG tablet Take 1 tablet (50 mg total) by mouth 2 (two) times daily. 01/24/12  Yes Russella Dar, NP   Physical Exam: Filed Vitals:   03/07/12 0937  03/07/12 1018 03/07/12 1441 03/07/12 1501  BP: 169/95 187/97 182/104 139/67  Pulse: 78 67 94 92  Temp:   99.2 F (37.3 C)   TempSrc:   Oral   Resp: 10  18   SpO2: 98%  97%    General appearance: alert, cooperative, appears anxious and diaphoretic, has obvious tremors Head: Normocephalic, without obvious abnormality, atraumatic  Eyes: conjunctivae/corneas clear. PERRL, EOM's intact. Fundi benign.  Nose: Nares normal. Septum midline. Mucosa normal. No drainage or sinus tenderness.  Throat: lips, mucosa, and tongue normal; teeth and gums normal  Neck: Supple, no masses, no cervical lymphadenopathy, no JVD appreciated, no meningeal signs Resp: clear to auscultation bilaterally  Chest wall: no tenderness  Cardio: regular rate and rhythm, S1, S2 normal, no murmur, click, rub or gallop  GI: soft, non-tender; bowel sounds normal; no masses, no organomegaly  Extremities: extremities normal, atraumatic, no cyanosis or edema  Skin: Skin color, texture, turgor normal. No rashes or lesions  Neurologic: Alert and oriented X 3, normal strength and tone. Normal symmetric reflexes. Normal coordination and gait   Labs on Admission:  Basic Metabolic Panel:  Lab 03/07/12 4098 03/07/12 0948  NA -- 137  K 3.3* 5.4*  CL -- 95*  CO2 -- 29  GLUCOSE -- 105*  BUN -- 10  CREATININE -- 0.60  CALCIUM -- 9.7  MG -- --  PHOS -- --   Liver Function Tests:  Lab 03/07/12 0948  AST 48*  ALT 23  ALKPHOS 85  BILITOT 0.6  PROT 7.8  ALBUMIN 4.5    Lab 03/07/12 0948  LIPASE 33  AMYLASE --   No results found for this basename: AMMONIA:5 in the last 168 hours CBC:  Lab 03/07/12 0948  WBC 4.4  NEUTROABS 2.8  HGB 14.2  HCT 41.3  MCV 90.2  PLT 159   Cardiac Enzymes: No results found for this basename: CKTOTAL:5,CKMB:5,CKMBINDEX:5,TROPONINI:5 in the last 168 hours  BNP (last 3 results)  Basename 01/19/12 1905 12/09/11 1655  PROBNP 135.3* 110.2   CBG: No results found for this basename:  GLUCAP:5 in the last 168 hours  Radiological Exams on Admission: Dg Chest 2 View  03/07/2012  *RADIOLOGY REPORT*  Clinical Data: Chest pain, dizziness, weakness.  CHEST - 2 VIEW  Comparison: 01/19/2012  Findings: Heart is upper limits normal in size.  Lungs are clear. No effusions.  No acute bony abnormality.  IMPRESSION: No acute cardiopulmonary disease.   Original Report Authenticated By: Charlett Nose, M.D.     EKG: Independently reviewed.   Assessment/Plan Principal Problem:  *Alcohol withdrawal Active Problems:  Atypical chest pain  Alcohol abuse  Tobacco abuse  Dilated cardiomyopathy secondary to alcohol   Alcohol withdrawal -Significant alcohol intake, about a fifth of whiskey daily, last drink was 03/05/2012, he had one drink yesterday. -Had previous significant DTs with seizure. -His CIWA-AR is more than 15, started on CIWA protocol with extra as needed Ativan. -Will be placed on step down unit for close monitoring.  Atypical chest pain -Patient has chronic chest pain, appears to be musculoskeletal. -Chest pain is completely reproducible, first set of troponin and 12-lead EKG is negative for cardiac ischemia. -I'll obtain 3 serial sets of troponin, repeat EKG in a.m. -Discussed with Dr. Sharyn Lull, he recommended  to continue withdrawal management.  Dilated cardiomyopathy -With recent history of V. tach, his ejection fraction is 25% his end-diastolic volume is 151 mL. On 01/21/2012 -Patient is on metoprolol, losartan and aspirin we will continue. -Patient will be on IV fluids, will hydrate gently we'll reassess the need for fluids in a.m. -Per Dr. Sharyn Lull followup as outpatient with 2-D echocardiogram, he might be a candidate for ICD if systolic function did not improve.  Tobacco abuse -Counseled extensively on smoking cessation.  Chronic neck/back pain -Patient has previous neck and back fusion surgery. -Complains about some hand tingling and numbness increases with  movement. -This is might be secondary to cervical radiculopathy, cannot rule out tactile disturbances from DTs. -He is on chronic methadone, we will continue, although the UDS is negative for opiates patient repeatedly confirmed that he takes his methadone.   Code Status: Full code Family Communication: Spoke to the patient was awake and alert Disposition Plan: Stepdown, telemetry  Time spent: 70 minutes  [facility] A [facility] Pager [phone]  If 7PM-7AM, please contact night-coverage [email or web address] Password [facility] 03/07/2012, 5:16 PM

## 2012-03-07 NOTE — ED Provider Notes (Signed)
History     CSN: [id number]  Arrival date & time 03/07/12  4098   First MD Initiated Contact with Patient 03/07/12 [phone]      Chief Complaint  Patient presents with  . Chest Pain    (Consider location/radiation/quality/duration/timing/severity/associated sxs/prior treatment) HPI Comments: 46 y.o. male with past medical history of alcoholic cardiomyopathy and LVEF of 25% evaluated this past November during an admission presents with cc Chest pain and ETOH withdraw sxs.. Patient decided to quit drinking 2 days ago and is in actve withdraws.  He has been seen here many times in the past for the same. He has a history of withdraw seizure. He describes CP as constant, sharp, increases with arm movement. He has no other complaints at this time.     The history is provided by the patient. No language interpreter was used.    Past Medical History  Diagnosis Date  . Hypertension   . Hyperlipidemia   . 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 ETOH abuse   . GERD (gastroesophageal reflux disease)   . Mental disorder   . Irregular heart beat     Past Surgical History  Procedure Date  . Back surgery   . Neck surgery   . Fundoplasty transthoracic 2003  . Nasal sinus surgery     Family History  Problem Relation Age of Onset  . Breast cancer Paternal Grandmother   . Lung cancer      was a smoker  . Hypertension Mother     History  Substance Use Topics  . Smoking status: Current Every Day Smoker -- 2.0 packs/day for 30 years    Types: Cigarettes  . Smokeless tobacco: Never Used  . Alcohol Use: 4.2 oz/week    7 Shots of liquor per week     Comment: drinks 1pint + each day      Review of Systems Ten systems reviewed and are negative for acute change, except as noted in the HPI.   Allergies  Lisinopril  Home Medications   Current Outpatient Rx  Name  Route  Sig  Dispense  Refill  . ASPIRIN 325 MG PO TBEC   Oral   Take 1  tablet (325 mg total) by mouth daily.   30 tablet      . FLUOXETINE HCL 20 MG PO CAPS   Oral   Take 1 capsule (20 mg total) by mouth daily.   30 capsule   11   . LOSARTAN POTASSIUM 100 MG PO TABS   Oral   Take 100 mg by mouth daily.         Marland Kitchen METHADONE HCL 10 MG PO TABS   Oral   Take 20 mg by mouth daily.          Marland Kitchen METOPROLOL TARTRATE 50 MG PO TABS   Oral   Take 1 tablet (50 mg total) by mouth 2 (two) times daily.   60 tablet   11     BP 139/67  Pulse 92  Temp 99.2 F (37.3 C) (Oral)  Resp 18  SpO2 97%  Physical Exam Physical Exam  Nursing note and vitals reviewed. Constitutional: He appears well-developed and well-nourished. He is anxious and tremulous,  Pupils are dilated. Skin is flushed and patient is diaphoretic. HENT:  Head: Normocephalic and atraumatic.  Eyes: Conjunctivae normal are normal. No scleral icterus.  Neck: Normal range of motion. Neck supple.  Cardiovascular: Normal rate,  regular rhythm and normal heart sounds.   Pulmonary/Chest: Effort normal and breath sounds normal. No respiratory distress.  Abdominal: Soft. There is no tenderness.  Musculoskeletal: He exhibits no edema.  Neurological: He is alert.  Skin: Skin is warm and dry. He is not diaphoretic.  Psychiatric: His behavior is normal.    ED Course  Procedures (including critical care time)  Labs Reviewed  CBC WITH DIFFERENTIAL - Abnormal; Notable for the following:    Eosinophils Relative 8 (*)     All other components within normal limits  COMPREHENSIVE METABOLIC PANEL - Abnormal; Notable for the following:    Potassium 5.4 (*)  HEMOLYSIS AT THIS LEVEL MAY AFFECT RESULT   Chloride 95 (*)     Glucose, Bld 105 (*)     AST 48 (*)     All other components within normal limits  ETHANOL - Abnormal; Notable for the following:    Alcohol, Ethyl (B) 119 (*)     All other components within normal limits  POTASSIUM - Abnormal; Notable for the following:    Potassium 3.3 (*)  DELTA  CHECK NOTED   All other components within normal limits  COMPREHENSIVE METABOLIC PANEL - Abnormal; Notable for the following:    Glucose, Bld 113 (*)     Total Bilirubin 1.5 (*)     All other components within normal limits  CBC - Abnormal; Notable for the following:    Platelets 136 (*)     All other components within normal limits  BASIC METABOLIC PANEL - Abnormal; Notable for the following:    Glucose, Bld 110 (*)     All other components within normal limits  BASIC METABOLIC PANEL - Abnormal; Notable for the following:    Glucose, Bld 109 (*)     All other components within normal limits  VITAMIN B12  LIPASE, BLOOD  URINALYSIS, ROUTINE W REFLEX MICROSCOPIC  URINE RAPID DRUG SCREEN (HOSP PERFORMED)  POCT I-STAT TROPONIN I  MRSA PCR SCREENING  TROPONIN I  TROPONIN I  TROPONIN I   Dg Chest 2 View  03/07/2012  *RADIOLOGY REPORT*  Clinical Data: Chest pain, dizziness, weakness.  CHEST - 2 VIEW  Comparison: 01/19/2012  Findings: Heart is upper limits normal in size.  Lungs are clear. No effusions.  No acute bony abnormality.  IMPRESSION: No acute cardiopulmonary disease.   Original Report Authenticated By: Charlett Nose, M.D.      1. Alcohol withdrawal       MDM  4:28 PMPatient with recent cardiac workup including echo. He has dialted cardiomyopathy and cp.  Patient will need medical admission for his ETOH withdraw.  Patient's initial Potassium was elevated and likely due to hemolyzed blood sample as repeat was WNL.  I have spoken with Dr. Arthor Captain who will admit the patient to step down. He has asked that I speak with Dr. Sharyn Lull for consult.        Arthor Captain, PA-C 03/11/12 1802

## 2012-03-07 NOTE — ED Notes (Signed)
Report given to [facility], Charity fundraiser.  Pt to go to Pod C-23.

## 2012-03-07 NOTE — ED Notes (Signed)
Pt reports having chest pain for several days. Pain increases with movement. Now having numbness to both hands and feet. Reports being heavy etoh use, has decreased his intake recently and thinks he is also detoxing.

## 2012-03-07 NOTE — ED Notes (Signed)
Per PA, Arthor Captain. CIWA to be reassessed q2h and Ativan only given when pt becomes hypertensive and tachycardic.

## 2012-03-08 DIAGNOSIS — R109 Unspecified abdominal pain: Secondary | ICD-10-CM

## 2012-03-08 LAB — COMPREHENSIVE METABOLIC PANEL
ALT: 16 U/L (ref 0–53)
BUN: 10 mg/dL (ref 6–23)
CO2: 28 mEq/L (ref 19–32)
Calcium: 9.6 mg/dL (ref 8.4–10.5)
Creatinine, Ser: 0.65 mg/dL (ref 0.50–1.35)
GFR calc Af Amer: >90 mL/min (ref >90)
GFR calc non Af Amer: >90 mL/min (ref >90)
Glucose, Bld: 113 mg/dL — ABNORMAL HIGH (ref 70–99)

## 2012-03-08 LAB — TROPONIN I
Troponin I: <0.3 ng/mL (ref <0.30)
Troponin I: <0.3 ng/mL (ref <0.30)

## 2012-03-08 LAB — CBC
Hemoglobin: 14.4 g/dL (ref 13.0–17.0)
MCHC: 35.2 g/dL (ref 30.0–36.0)
RBC: 4.52 MIL/uL (ref 4.22–5.81)
WBC: 6.9 10*3/uL (ref 4.0–10.5)

## 2012-03-08 MED ORDER — SODIUM CHLORIDE 0.9 % IV SOLN
INTRAVENOUS | Status: DC
  Administered 2012-03-08: 12:00:00 via INTRAVENOUS

## 2012-03-08 NOTE — Progress Notes (Signed)
[facility] PROGRESS NOTE  Mark [name] ZOX:[id number] DOB: 04/07/1965 DOA: 03/07/2012 PCP: Lenora Boys, MD  HPI/Subjective: Still shaking, mention less anxiety since yesterday.   Assessment/Plan:  Alcohol withdrawal  -Significant alcohol intake, about a fifth of whiskey daily, last drink was 03/05/2012, he had one drink yesterday.  -Had previous significant DTs with seizure.  -His CIWA-AR is more than 15, started on CIWA protocol with extra as needed Ativan.  -His urine tox is negative for opiates I wondering if he is withdrawing from opiates too. -Transferred to telemetry  Atypical chest pain  -Patient has chronic chest pain, appears to be musculoskeletal.  -Chest pain is completely reproducible, first set of troponin and 12-lead EKG is negative for cardiac ischemia.  -I'll obtain 3 serial sets of troponin, repeat EKG showed no evidence of ischemia. -Discussed with Dr. Sharyn Lull, he recommended to continue withdrawal management.  Dilated cardiomyopathy  -With recent history of V. tach, his ejection fraction is 25% his end-diastolic volume is 151 mL. On 01/21/2012  -Patient is on metoprolol, losartan and aspirin we will continue.  -Patient will be on IV fluids, will hydrate gently we'll reassess the need for fluids in a.m.  -Per Dr. Sharyn Lull followup as outpatient with 2-D echocardiogram, he might be a candidate for ICD if systolic function did not improve.   Tobacco abuse  -Counseled extensively on smoking cessation.   Chronic neck/back pain  -Patient has previous neck and back fusion surgery.  -Complains about some hand tingling and numbness increases with movement.  -This is might be secondary to cervical radiculopathy, cannot rule out tactile disturbances from DTs.  -He is on chronic methadone, we will continue, although the UDS is negative for opiates patient repeatedly confirmed that he takes his methadone.   Code Status: Full code Family Communication:    Disposition Plan: Remains in patient   Consultants:  None  Procedures:  None  Antibiotics:  None   Objective: Filed Vitals:   03/08/12 0955 03/08/12 1107 03/08/12 1145 03/08/12 1300  BP: 153/93  155/100 154/110  Pulse: 76  62 91  Temp:  98 F (36.7 C)    TempSrc:  Oral    Resp:   13 14  Weight:      SpO2: 97%  96% 97%    Intake/Output Summary (Last 24 hours) at 03/08/12 1446 Last data filed at 03/08/12 1400  Gross per 24 hour  Intake 2303.75 ml  Output   2025 ml  Net 278.75 ml   Filed Weights   03/08/12 0600  Weight: 88.724 kg (195 lb 9.6 oz)    Exam:  General: Alert and awake, oriented x3, not in any acute distress. HEENT: anicteric sclera, pupils reactive to light and accommodation, EOMI CVS: S1-S2 clear, no murmur rubs or gallops Chest: clear to auscultation bilaterally, no wheezing, rales or rhonchi Abdomen: soft nontender, nondistended, normal bowel sounds, no organomegaly Extremities: no cyanosis, clubbing or edema noted bilaterally Neuro: Cranial nerves II-XII intact, no focal neurological deficits  Data Reviewed: Basic Metabolic Panel:  Lab 03/08/12 8119 03/07/12 1401 03/07/12 0948  NA 140 -- 137  K 3.8 3.3* 5.4*  CL 102 -- 95*  CO2 28 -- 29  GLUCOSE 113* -- 105*  BUN 10 -- 10  CREATININE 0.65 -- 0.60  CALCIUM 9.6 -- 9.7  MG -- -- --  PHOS -- -- --   Liver Function Tests:  Lab 03/08/12 0742 03/07/12 0948  AST 28 48*  ALT 16 23  ALKPHOS 98 85  BILITOT 1.5* 0.6  PROT 6.8 7.8  ALBUMIN 4.1 4.5    Lab 03/07/12 0948  LIPASE 33  AMYLASE --   No results found for this basename: AMMONIA:5 in the last 168 hours CBC:  Lab 03/08/12 0742 03/07/12 0948  WBC 6.9 4.4  NEUTROABS -- 2.8  HGB 14.4 14.2  HCT 40.9 41.3  MCV 90.5 90.2  PLT 136* 159   Cardiac Enzymes:  Lab 03/08/12 0742 03/08/12 0013 03/07/12 1946  CKTOTAL -- -- --  CKMB -- -- --  CKMBINDEX -- -- --  TROPONINI <0.30 <0.30 <0.30   BNP (last 3 results)  Basename  01/19/12 1905 12/09/11 1655  PROBNP 135.3* 110.2   CBG: No results found for this basename: GLUCAP:5 in the last 168 hours  Recent Results (from the past 240 hour(s))  MRSA PCR SCREENING     Status: Normal   Collection Time   03/07/12  6:26 PM      Component Value Range Status Comment   MRSA by PCR NEGATIVE  NEGATIVE Final      Studies: Dg Chest 2 View  03/07/2012  *RADIOLOGY REPORT*  Clinical Data: Chest pain, dizziness, weakness.  CHEST - 2 VIEW  Comparison: 01/19/2012  Findings: Heart is upper limits normal in size.  Lungs are clear. No effusions.  No acute bony abnormality.  IMPRESSION: No acute cardiopulmonary disease.   Original Report Authenticated By: Charlett Nose, M.D.     Scheduled Meds:   . aspirin EC  325 mg Oral Daily  . FLUoxetine  20 mg Oral Daily  . folic acid  1 mg Oral Daily  . heparin  5,000 Units Subcutaneous Q8H  . LORazepam  0-4 mg Intravenous Q6H   Followed by  . LORazepam  0-4 mg Intravenous Q12H  . losartan  100 mg Oral Daily  . methadone  20 mg Oral Daily  . metoprolol  50 mg Oral BID  . multivitamin with minerals  1 tablet Oral Daily  . sodium chloride  3 mL Intravenous Q12H  . thiamine  100 mg Oral Daily   Or  . thiamine  100 mg Intravenous Daily   Continuous Infusions:   . sodium chloride 20 mL/hr at 03/08/12 1200  . general admission iv infusion 75 mL/hr at 03/08/12 0700    Principal Problem:  *Alcohol withdrawal Active Problems:  Atypical chest pain  Alcohol abuse  Tobacco abuse  Dilated cardiomyopathy secondary to alcohol    Time spent: 35 minutes    [facility] A  [facility] Pager [phone]. If 8PM-8AM, please contact night-coverage at [email or web address], password [facility] 03/08/2012, 2:46 PM  LOS: 1 day

## 2012-03-09 MED ORDER — LORAZEPAM 1 MG PO TABS
1.0000 mg | ORAL_TABLET | Freq: Four times a day (QID) | ORAL | Status: AC | PRN
  Administered 2012-03-10: 1 mg via ORAL
  Filled 2012-03-09: qty 1

## 2012-03-09 MED ORDER — LORAZEPAM 2 MG/ML IJ SOLN: 1.0000 mg | Freq: Four times a day (QID) | INTRAMUSCULAR | Status: AC | PRN

## 2012-03-09 MED ORDER — LORAZEPAM 1 MG PO TABS
0.0000 mg | ORAL_TABLET | Freq: Four times a day (QID) | ORAL | Status: AC
  Administered 2012-03-10 (×3): 1 mg via ORAL
  Filled 2012-03-09 (×2): qty 1

## 2012-03-09 MED ORDER — LORAZEPAM 1 MG PO TABS
1.0000 mg | ORAL_TABLET | Freq: Once | ORAL | Status: AC
  Administered 2012-03-09: 1 mg via ORAL

## 2012-03-09 MED ORDER — POTASSIUM CHLORIDE CRYS ER 20 MEQ PO TBCR
40.0000 meq | EXTENDED_RELEASE_TABLET | Freq: Once | ORAL | Status: AC
  Administered 2012-03-09: 40 meq via ORAL
  Filled 2012-03-09: qty 2

## 2012-03-09 MED ORDER — NICOTINE 21 MG/24HR TD PT24
21.0000 mg | MEDICATED_PATCH | Freq: Every day | TRANSDERMAL | Status: DC
  Administered 2012-03-09 – 2012-03-12 (×4): 21 mg via TRANSDERMAL
  Filled 2012-03-09 (×4): qty 1

## 2012-03-09 MED ORDER — LORAZEPAM 1 MG PO TABS
0.0000 mg | ORAL_TABLET | Freq: Two times a day (BID) | ORAL | Status: AC
  Administered 2012-03-10: 1 mg via ORAL
  Administered 2012-03-11: 2 mg via ORAL
  Filled 2012-03-09: qty 1
  Filled 2012-03-09: qty 2
  Filled 2012-03-09 (×2): qty 1

## 2012-03-09 NOTE — Progress Notes (Signed)
Paged [name], MD regarding no IV access. Patient pulled out his IV last night and refused restart when the night nurse tried to get another one. Dr. Arthor Captain said it was okay to leave IV out. Mark [name]

## 2012-03-09 NOTE — Progress Notes (Signed)
Patient received 4mg  of Ativan per CIWA protocol at 12:40am for CIWA score of 24. Ativan seem to have no effect on patient who continues to experience visual hallucinations, tremors and delusions. Patient continues to remove his tele-monitor and has refused to wear it. Patient has been smoking in the bathroom; after a search of his room and clothing I found a bag containing several prescription medications and aspirin, the patient was advised that we will list and count his medication and store in the pharmacy per hospital policy. Mark [name] has not been violent but is uncooperative, removes his IV and presents a safety problem because he continues to get out of without calling for help, he also disarms the bed alarm. The patient is unsteady and unbalanced when ambulating, a safety sitter has been requested.

## 2012-03-09 NOTE — Progress Notes (Signed)
[facility] PROGRESS NOTE  EREN RYSER WUJ:[id number] DOB: 05-31-1965 DOA: 03/07/2012 PCP: Lenora Boys, MD  HPI/Subjective: Ulceration last night, still on CIWA protocol   Assessment/Plan:  Alcohol withdrawal  -Significant alcohol intake, about a fifth of whiskey daily, last drink was 03/05/2012, he had one drink yesterday.  -Had previous significant DTs with seizure.  -His CIWA-AR is more than 15, started on CIWA protocol with extra as needed Ativan.  -His urine tox is negative for opiates I wondering if he is withdrawing from opiates too. Continue Ativan.   Atypical chest pain  -Patient has chronic chest pain, appears to be musculoskeletal.  -Chest pain is completely reproducible, first set of troponin and 12-lead EKG is negative for cardiac ischemia.  -I'll obtain 3 serial sets of troponin, repeat EKG showed no evidence of ischemia. -Discussed with Dr. Sharyn Lull, he recommended to continue withdrawal management.  Dilated cardiomyopathy  -With recent history of V. tach, his ejection fraction is 25% his end-diastolic volume is 151 mL. On 01/21/2012  -Patient is on metoprolol, losartan and aspirin we will continue.  -Patient will be on IV fluids, will hydrate gently we'll reassess the need for fluids in a.m.  -Per Dr. Sharyn Lull followup as outpatient with 2-D echocardiogram, he might be a candidate for ICD if systolic function did not improve.   Tobacco abuse  -Counseled extensively on smoking cessation.   Chronic neck/back pain  -Patient has previous neck and back fusion surgery.  -Complains about some hand tingling and numbness increases with movement.  -This is might be secondary to cervical radiculopathy, cannot rule out tactile disturbances from DTs.  -He is on chronic methadone, we will continue, although the UDS is negative for opiates patient repeatedly confirmed that he takes his methadone.   Code Status: Full code Family Communication:  Disposition Plan:  Remains in patient   Consultants:  None  Procedures:  None  Antibiotics:  None   Objective: Filed Vitals:   03/08/12 2004 03/09/12 0035 03/09/12 0639 03/09/12 0845  BP: 146/90 110/60 115/67   Pulse: 80 85 78   Temp: 99.1 F (37.3 C)  99 F (37.2 C)   TempSrc: Oral  Oral   Resp: 17  18   Height:    6\' 4"  (1.93 m)  Weight:      SpO2: 99%  98%     Intake/Output Summary (Last 24 hours) at 03/09/12 1058 Last data filed at 03/08/12 1400  Gross per 24 hour  Intake    530 ml  Output    975 ml  Net   -445 ml   Filed Weights   03/08/12 0600 03/08/12 1915  Weight: 88.724 kg (195 lb 9.6 oz) 88.72 kg (195 lb 9.5 oz)    Exam:  General: Alert and awake, oriented x3, not in any acute distress. HEENT: anicteric sclera, pupils reactive to light and accommodation, EOMI CVS: S1-S2 clear, no murmur rubs or gallops Chest: clear to auscultation bilaterally, no wheezing, rales or rhonchi Abdomen: soft nontender, nondistended, normal bowel sounds, no organomegaly Extremities: no cyanosis, clubbing or edema noted bilaterally Neuro: Cranial nerves II-XII intact, no focal neurological deficits  Data Reviewed: Basic Metabolic Panel:  Lab 03/08/12 9562 03/07/12 1401 03/07/12 0948  NA 140 -- 137  K 3.8 3.3* 5.4*  CL 102 -- 95*  CO2 28 -- 29  GLUCOSE 113* -- 105*  BUN 10 -- 10  CREATININE 0.65 -- 0.60  CALCIUM 9.6 -- 9.7  MG -- -- --  PHOS -- -- --  Liver Function Tests:  Lab 03/08/12 0742 03/07/12 0948  AST 28 48*  ALT 16 23  ALKPHOS 98 85  BILITOT 1.5* 0.6  PROT 6.8 7.8  ALBUMIN 4.1 4.5    Lab 03/07/12 0948  LIPASE 33  AMYLASE --   No results found for this basename: AMMONIA:5 in the last 168 hours CBC:  Lab 03/08/12 0742 03/07/12 0948  WBC 6.9 4.4  NEUTROABS -- 2.8  HGB 14.4 14.2  HCT 40.9 41.3  MCV 90.5 90.2  PLT 136* 159   Cardiac Enzymes:  Lab 03/08/12 0742 03/08/12 0013 03/07/12 1946  CKTOTAL -- -- --  CKMB -- -- --  CKMBINDEX -- -- --    TROPONINI <0.30 <0.30 <0.30   BNP (last 3 results)  Basename 01/19/12 1905 12/09/11 1655  PROBNP 135.3* 110.2   CBG: No results found for this basename: GLUCAP:5 in the last 168 hours  Recent Results (from the past 240 hour(s))  MRSA PCR SCREENING     Status: Normal   Collection Time   03/07/12  6:26 PM      Component Value Range Status Comment   MRSA by PCR NEGATIVE  NEGATIVE Final      Studies: No results found.  Scheduled Meds:    . aspirin EC  325 mg Oral Daily  . FLUoxetine  20 mg Oral Daily  . folic acid  1 mg Oral Daily  . heparin  5,000 Units Subcutaneous Q8H  . LORazepam  0-4 mg Intravenous Q6H   Followed by  . LORazepam  0-4 mg Intravenous Q12H  . losartan  100 mg Oral Daily  . methadone  20 mg Oral Daily  . metoprolol  50 mg Oral BID  . multivitamin with minerals  1 tablet Oral Daily  . nicotine  21 mg Transdermal Daily  . sodium chloride  3 mL Intravenous Q12H  . thiamine  100 mg Oral Daily   Or  . thiamine  100 mg Intravenous Daily   Continuous Infusions:    . sodium chloride 20 mL/hr at 03/08/12 1200  . 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 If 8PM-8AM, please contact night-coverage at [email or web address], password [facility] 03/09/2012, 10:58 AM  LOS: 2 days

## 2012-03-10 LAB — BASIC METABOLIC PANEL WITH GFR
BUN: 16 mg/dL (ref 6–23)
CO2: 25 meq/L (ref 19–32)
Calcium: 10.3 mg/dL (ref 8.4–10.5)
Chloride: 98 meq/L (ref 96–112)
Creatinine, Ser: 0.75 mg/dL (ref 0.50–1.35)
GFR calc Af Amer: >90 mL/min
GFR calc non Af Amer: >90 mL/min
Glucose, Bld: 110 mg/dL — ABNORMAL HIGH (ref 70–99)
Potassium: 3.8 meq/L (ref 3.5–5.1)
Sodium: 135 meq/L (ref 135–145)

## 2012-03-10 NOTE — Progress Notes (Signed)
Pt refusing tele monitoring and IV access per day shift RN. No D/C orders placed by primary physician. Spoke with Merdis Delay, PA and given verbal order to monitor pt Qshift and discuss D/C orders with MD in the am. Will continue to monitor pt closely.

## 2012-03-10 NOTE — Progress Notes (Signed)
[facility] PROGRESS NOTE  Mark [name] [id number] DOB: Apr 04, 1965 DOA: 03/07/2012 PCP: Lenora Boys, MD  HPI/Subjective: Shaking improved, has some anxiety   Assessment/Plan:  Alcohol withdrawal  -Significant alcohol intake, about a fifth of whiskey daily, last drink was 03/05/2012, he had one drink yesterday.  -Had previous significant DTs with seizure.  -His CIWA-AR is more than 15, started on CIWA protocol with extra as needed Ativan.  -His urine tox is negative for opiates I wondering if he is withdrawing from opiates too. Continue Ativan.   Atypical chest pain  -Patient has chronic chest pain, appears to be musculoskeletal.  -Chest pain is completely reproducible, first set of troponin and 12-lead EKG is negative for cardiac ischemia.  -I'll obtain 3 serial sets of troponin, repeat EKG showed no evidence of ischemia. -Discussed with Dr. Sharyn Lull, he recommended to continue withdrawal management.  Dilated cardiomyopathy  -With recent history of V. tach, his ejection fraction is 25% his end-diastolic volume is 151 mL. On 01/21/2012  -Patient is on metoprolol, losartan and aspirin we will continue.  -Patient will be on IV fluids, will hydrate gently we'll reassess the need for fluids in a.m.  -Per Dr. Sharyn Lull followup as outpatient with 2-D echocardiogram, he might be a candidate for ICD if systolic function did not improve.   Tobacco abuse  -Counseled extensively on smoking cessation.   Chronic neck/back pain  -Patient has previous neck and back fusion surgery.  -Complains about some hand tingling and numbness increases with movement.  -This is might be secondary to cervical radiculopathy, cannot rule out tactile disturbances from DTs.  -He is on chronic methadone, we will continue, although the UDS is negative for opiates patient repeatedly confirmed that he takes his methadone.   Code Status: Full code Family Communication:  Disposition Plan: Remains in  patient, likley D/C in AM   Consultants:  None  Procedures:  None  Antibiotics:  None   Objective: Filed Vitals:   03/09/12 0845 03/09/12 1318 03/09/12 2004 03/10/12 0437  BP:  118/81 136/91 123/89  Pulse:  80 79 71  Temp:  97.2 F (36.2 C) 97.6 F (36.4 C) 97.4 F (36.3 C)  TempSrc:  Oral Oral Oral  Resp:  18 18 18   Height: 6\' 4"  (1.93 m)     Weight:      SpO2:  100% 97% 97%    Intake/Output Summary (Last 24 hours) at 03/10/12 1026 Last data filed at 03/10/12 0500  Gross per 24 hour  Intake   1060 ml  Output      0 ml  Net   1060 ml   Filed Weights   03/08/12 0600 03/08/12 1915  Weight: 88.724 kg (195 lb 9.6 oz) 88.72 kg (195 lb 9.5 oz)    Exam:  General: Alert and awake, oriented x3, not in any acute distress. HEENT: anicteric sclera, pupils reactive to light and accommodation, EOMI CVS: S1-S2 clear, no murmur rubs or gallops Chest: clear to auscultation bilaterally, no wheezing, rales or rhonchi Abdomen: soft nontender, nondistended, normal bowel sounds, no organomegaly Extremities: no cyanosis, clubbing or edema noted bilaterally Neuro: Cranial nerves II-XII intact, no focal neurological deficits  Data Reviewed: Basic Metabolic Panel:  Lab 03/10/12 6295 03/08/12 0742 03/07/12 1401 03/07/12 0948  NA 135 140 -- 137  K 3.8 3.8 3.3* 5.4*  CL 98 102 -- 95*  CO2 25 28 -- 29  GLUCOSE 110* 113* -- 105*  BUN 16 10 -- 10  CREATININE 0.75 0.65 --  0.60  CALCIUM 10.3 9.6 -- 9.7  MG -- -- -- --  PHOS -- -- -- --   Liver Function Tests:  Lab 03/08/12 0742 03/07/12 0948  AST 28 48*  ALT 16 23  ALKPHOS 98 85  BILITOT 1.5* 0.6  PROT 6.8 7.8  ALBUMIN 4.1 4.5    Lab 03/07/12 0948  LIPASE 33  AMYLASE --   No results found for this basename: AMMONIA:5 in the last 168 hours CBC:  Lab 03/08/12 0742 03/07/12 0948  WBC 6.9 4.4  NEUTROABS -- 2.8  HGB 14.4 14.2  HCT 40.9 41.3  MCV 90.5 90.2  PLT 136* 159   Cardiac Enzymes:  Lab 03/08/12 0742  03/08/12 0013 03/07/12 1946  CKTOTAL -- -- --  CKMB -- -- --  CKMBINDEX -- -- --  TROPONINI <0.30 <0.30 <0.30   BNP (last 3 results)  Basename 01/19/12 1905 12/09/11 1655  PROBNP 135.3* 110.2   CBG: No results found for this basename: GLUCAP:5 in the last 168 hours  Recent Results (from the past 240 hour(s))  MRSA PCR SCREENING     Status: Normal   Collection Time   03/07/12  6:26 PM      Component Value Range Status Comment   MRSA by PCR NEGATIVE  NEGATIVE Final      Studies: No results found.  Scheduled Meds:    . aspirin EC  325 mg Oral Daily  . FLUoxetine  20 mg Oral Daily  . folic acid  1 mg Oral Daily  . heparin  5,000 Units Subcutaneous Q8H  . LORazepam  0-4 mg Oral Q6H   Followed by  . LORazepam  0-4 mg Oral Q12H  . losartan  100 mg Oral Daily  . methadone  20 mg Oral Daily  . metoprolol  50 mg Oral BID  . multivitamin with minerals  1 tablet Oral Daily  . nicotine  21 mg Transdermal Daily  . sodium chloride  3 mL Intravenous Q12H  . thiamine  100 mg Oral Daily   Or  . thiamine  100 mg Intravenous Daily   Continuous Infusions:    . sodium chloride 20 mL/hr at 03/08/12 1200  . 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 If 8PM-8AM, please contact night-coverage at [email or web address], password [facility] 03/10/2012, 10:26 AM  LOS: 3 days

## 2012-03-10 NOTE — Progress Notes (Signed)
Merdis Delay P.A. Paged and asked to review Ativan protocal since patient does not have I.V. Access. Patient feels like he needs more Ativan . Feels anx. Sweaty and tremors are present. R.N. Aware see orders.

## 2012-03-11 DIAGNOSIS — F19239 Other psychoactive substance dependence with withdrawal, unspecified: Secondary | ICD-10-CM

## 2012-03-11 DIAGNOSIS — R569 Unspecified convulsions: Secondary | ICD-10-CM

## 2012-03-11 HISTORY — DX: Other psychoactive substance dependence with withdrawal, unspecified: F19.239

## 2012-03-11 HISTORY — DX: Unspecified convulsions: R56.9

## 2012-03-11 LAB — BASIC METABOLIC PANEL
Calcium: 10.1 mg/dL (ref 8.4–10.5)
Chloride: 99 mEq/L (ref 96–112)
Creatinine, Ser: 0.67 mg/dL (ref 0.50–1.35)
GFR calc Af Amer: >90 mL/min (ref >90)

## 2012-03-11 MED ORDER — LORAZEPAM 1 MG PO TABS
1.0000 mg | ORAL_TABLET | Freq: Three times a day (TID) | ORAL | Status: DC | PRN
  Administered 2012-03-11 – 2012-03-12 (×2): 1 mg via ORAL
  Filled 2012-03-11 (×2): qty 1

## 2012-03-11 MED ORDER — VITAMIN B-12 1000 MCG PO TABS
1000.0000 ug | ORAL_TABLET | Freq: Every day | ORAL | Status: DC
  Administered 2012-03-11 – 2012-03-12 (×2): 1000 ug via ORAL
  Filled 2012-03-11 (×2): qty 1

## 2012-03-11 NOTE — Discharge Summary (Signed)
Physician Discharge Summary  Mark [name] ZOX:[id number] DOB: 28-Aug-1965 DOA: 03/07/2012  PCP: Lenora Boys, MD  Admit date: 03/07/2012 Discharge date: 03/11/2012  Recommendations for Outpatient Follow-up:  1. Pt will need to follow up with PCP in 2 weeks post discharge 2. Go to [facility] for Alcohol Rehab services  Discharge Diagnoses:  Alcohol withdrawal  -Significant alcohol intake, about a fifth of whiskey daily, last drink was 03/05/2012 -Had previous significant DTs with seizure.  -His CIWA-AR is more than 15, started on CIWA protocol with extra as needed Ativan. Was as high as 24 -His urine tox is negative for opiates question if he is withdrawing from opiates too. Continue Ativan. Continue methadone Atypical chest pain  -Patient has chronic chest pain, appears to be musculoskeletal.  -Chest pain is completely reproducible, first set of troponin and 12-lead EKG is negative for cardiac ischemia.  -3 serial sets of troponin, repeat EKG showed no evidence of ischemia.  -Discussed with Dr. Sharyn Lull, he recommended to continue withdrawal management.  Dilated cardiomyopathy  -With recent history of V. tach, his ejection fraction is 25% his end-diastolic volume is 151 mL. On 01/21/2012  -Patient is on metoprolol, losartan and aspirin we will continue.  -Patient will be on IV fluids, will hydrate gently we'll reassess the need for fluids in a.m.  -Per Dr. Sharyn Lull followup as outpatient with 2-D echocardiogram, he might be a candidate for ICD if systolic function did not improve.  Tobacco abuse  -Counseled extensively on smoking cessation.  Chronic neck/back pain  -Patient has previous neck and back fusion surgery.  -Complains about some hand tingling and numbness increases with movement.  -This is might be secondary to cervical radiculopathy, marginally low, but cannot rule out tactile disturbances from DTs but less likely -supplement B12 Code Status: Full code   Disposition Plan:  Remains in patient, likley D/C in AM   Discharge Condition: stable  Disposition: home  Diet:regular Wt Readings from Last 3 Encounters:  03/08/12 88.72 kg (195 lb 9.5 oz)  01/24/12 87.181 kg (192 lb 3.2 oz)  11/18/11 90.719 kg (200 lb)    History of present illness:   47 y.o. 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 Patient also has alcohol abuse, he drinks about a fifth of whiskey daily, he has history of DTs associated with seizure previously. Patient also has chronic back and neck pain for which he's using methadone.  He came into the hospital complaining initially of chest pain. Patient was found to be in withdrawal from alcohol he'll be admitted to the hospital for further evaluation. Patient said he has right sided chest pain, is being chronic at least for the past year, he has marked worsening recently, the pain is constant, sharp, increases with arm movement. Is also associated with tingling and numbness of the right hand. Upon initial evaluation in the emergency department he has negative first set of cardiac enzymes, negative EKG for ischemia. He was found to h be diaphoretic, hypotensive, tachycardic and anxious consistent with alcohol withdrawal.  Hospital Course:  The patient was started on intravenous hydration as well as the alcohol withdrawal protocol. His initial alcohol level was found to be 119. Urine drug screen was negative. The patient was monitored closely using the CIWA scale. The patient was given around-the-clock as well as intermittent dosing of Ativan. His CIWA scores went as high as 24. Per the protocol, the patient's Ativan was gradually weaned. The  patient gradually improved with less tremors, diaphoresis, anxiety. There was no evidence of seizure or delirium tremens. There was no hallucinations, auditory or visual. The patient's Ativan was gradually weaned to 1 mg every 12 hours scheduled  and eventually off. However, intermittent dosing Ativan was kept when necessary anxiety. His CIWA score gradually improved and was <8 x 48hrs before discharge.  The patient was continued on thiamine, and he was also started on B12 supplementation as his B12 was marginally low. Given the patient's history, oral B12 supplementation would be prudent at the time of discharge.  Regarding the patient's chest pain, troponins were negative. EKG was negative for acute coronary syndrome. His chest pain was reproducible and thought to be musculoskeletal in nature. The patient was continued on his metoprolol, losartan, and aspirin for his dilated cardiomyopathy. There were no signs of decompensation. The patient was given instructions to followup with Dr. Sharyn Lull and outpatient echocardiogram.    Discharge Exam: Filed Vitals:   03/11/12 1411  BP: 163/96  Pulse: 77  Temp: 97.4 F (36.3 C)  Resp: 18   Filed Vitals:   03/10/12 2243 03/11/12 0346 03/11/12 1158 03/11/12 1411  BP: 130/82 116/73 125/83 163/96  Pulse: 72 75 73 77  Temp:  98.1 F (36.7 C)  97.4 F (36.3 C)  TempSrc:  Oral  Oral  Resp:  18  18  Height:      Weight:      SpO2:  99%  98%   General: A&O x 3, NAD, pleasant, cooperative Cardiovascular: RRR, no rub, no gallop, no S3 Respiratory: CTAB, no wheeze, no rhonchi Abdomen:soft, nontender, nondistended, positive bowel sounds Extremities: No edema, No lymphangitis, no petechiae  Discharge Instructions     Medication List     As of 03/11/2012  7:06 PM    ASK your doctor about these medications         aspirin 325 MG EC tablet   Take 1 tablet (325 mg total) by mouth daily.      FLUoxetine 20 MG capsule   Commonly known as: PROZAC   Take 1 capsule (20 mg total) by mouth daily.      losartan 100 MG tablet   Commonly known as: COZAAR   Take 100 mg by mouth daily.      methadone 10 MG tablet   Commonly known as: DOLOPHINE   Take 20 mg by mouth daily.      metoprolol 50  MG tablet   Commonly known as: LOPRESSOR   Take 1 tablet (50 mg total) by mouth 2 (two) times daily.         The results of significant diagnostics from this hospitalization (including imaging, microbiology, ancillary and laboratory) are listed below for reference.    Significant Diagnostic Studies: Dg Chest 2 View  03/07/2012  *RADIOLOGY REPORT*  Clinical Data: Chest pain, dizziness, weakness.  CHEST - 2 VIEW  Comparison: 01/19/2012  Findings: Heart is upper limits normal in size.  Lungs are clear. No effusions.  No acute bony abnormality.  IMPRESSION: No acute cardiopulmonary disease.   Original Report Authenticated By: Charlett Nose, M.D.      Microbiology: Recent Results (from the past 240 hour(s))  MRSA PCR SCREENING     Status: Normal   Collection Time   03/07/12  6:26 PM      Component Value Range Status Comment   MRSA by PCR NEGATIVE  NEGATIVE Final      Labs: Basic Metabolic Panel:  Lab 03/11/12 9604  03/10/12 0500 03/08/12 0742 03/07/12 0948  NA 139 135 140 137  K 3.8 3.8 -- --  CL 99 98 102 95*  CO2 29 25 28 29   GLUCOSE 109* 110* 113* 105*  BUN 13 16 10 10   CREATININE 0.67 0.75 0.65 0.60  CALCIUM 10.1 10.3 9.6 9.7  MG -- -- -- --  PHOS -- -- -- --   Liver Function Tests:  Lab 03/08/12 0742 03/07/12 0948  AST 28 48*  ALT 16 23  ALKPHOS 98 85  BILITOT 1.5* 0.6  PROT 6.8 7.8  ALBUMIN 4.1 4.5    Lab 03/07/12 0948  LIPASE 33  AMYLASE --   No results found for this basename: AMMONIA:5 in the last 168 hours CBC:  Lab 03/08/12 0742 03/07/12 0948  WBC 6.9 4.4  NEUTROABS -- 2.8  HGB 14.4 14.2  HCT 40.9 41.3  MCV 90.5 90.2  PLT 136* 159   Cardiac Enzymes:  Lab 03/08/12 0742 03/08/12 0013 03/07/12 1946  CKTOTAL -- -- --  CKMB -- -- --  CKMBINDEX -- -- --  TROPONINI <0.30 <0.30 <0.30   BNP: No components found with this basename: POCBNP:5 CBG: No results found for this basename: GLUCAP:5 in the last 168 hours  Time coordinating discharge:   Greater than 30 minutes  Signed:  Nick Stults, DO [facility] Pager: [phone] 03/11/2012, 7:06 PM

## 2012-03-11 NOTE — Progress Notes (Signed)
Pt getting frustrated with Ativan dose changes. States he has not been involved in/ no one has explained his plan of care. Pt states, "I feel really shaky and can tell when the medication is wearing off. I know I still need in every 6 hours." Educated pt on s/s of withdrawal and the necessity of ativan as symptoms resolve. MD, please give further education to pt r/t lack of understanding regarding symptom management of withdrawal and the need for Ativan depending on CIWA score, not as a PRN pain/anxiety medication.

## 2012-03-12 LAB — MAGNESIUM: Magnesium: 2.3 mg/dL (ref 1.5–2.5)

## 2012-03-12 LAB — BASIC METABOLIC PANEL
Calcium: 10.2 mg/dL (ref 8.4–10.5)
GFR calc non Af Amer: >90 mL/min (ref >90)
Sodium: 139 mEq/L (ref 135–145)

## 2012-03-12 MED ORDER — CHLORDIAZEPOXIDE HCL 10 MG PO CAPS: 10.0000 mg | ORAL_CAPSULE | Freq: Three times a day (TID) | ORAL | Status: DC | PRN

## 2012-03-12 MED ORDER — CHLORDIAZEPOXIDE HCL 5 MG PO CAPS
10.0000 mg | ORAL_CAPSULE | Freq: Three times a day (TID) | ORAL | Status: DC | PRN
Start: 2012-03-12 — End: 2012-03-12

## 2012-03-12 MED ORDER — FOLIC ACID 1 MG PO TABS: 1.0000 mg | ORAL_TABLET | Freq: Every day | ORAL | Status: DC

## 2012-03-12 MED ORDER — CYANOCOBALAMIN 1000 MCG PO TABS: 1000.0000 ug | ORAL_TABLET | Freq: Every day | ORAL | Status: DC

## 2012-03-12 MED ORDER — NICOTINE 21 MG/24HR TD PT24: 1.0000 | MEDICATED_PATCH | Freq: Every day | TRANSDERMAL | Status: DC

## 2012-03-12 MED ORDER — ADULT MULTIVITAMIN W/MINERALS CH: 1.0000 | ORAL_TABLET | Freq: Every day | ORAL | Status: DC

## 2012-03-12 MED ORDER — THIAMINE HCL 100 MG PO TABS: 100.0000 mg | ORAL_TABLET | Freq: Every day | ORAL | Status: DC

## 2012-03-12 NOTE — Progress Notes (Signed)
Patient c/o of anxiety, tremors, mild nausea mild head ache. Not sure if hearing dogs bark or noise from other rooms. . Has seen a  bird and small mouse in his room. S.B. 59 -60 S.R. Alert and orin. Follows commands and  R.N. Into assess and gave him ativan 1 mg po.

## 2012-03-12 NOTE — Care Management Note (Signed)
    Page 1 of 1   03/12/2012     3:13:53 PM   CARE MANAGEMENT NOTE 03/12/2012  Patient:  Mark [name], Mark [name]   Account Number:  [id number]  Date Initiated:  03/12/2012  Documentation initiated by:  Ja Ohman  Subjective/Objective Assessment:   PT ADM WITH ETOH WITHDRAWL ON 03/07/12.  PTA, PT INDEPENDENT, LIVES WITH MOTHER.     Action/Plan:   CSW CONSULTED FOR ETOH COUNSELING.  WILL FOLLOW FOR HOME NEEDS AS PT PROGRESSES.   Anticipated DC Date:  03/12/2012   Anticipated DC Plan:  HOME/SELF CARE  In-house referral  Clinical Social Worker      DC Planning Services  CM consult      Choice offered to / List presented to:             Status of service:  Completed, signed off Medicare Important Message given?   (If response is "NO", the following Medicare IM given date fields will be blank) Date Medicare IM given:   Date Additional Medicare IM given:    Discharge Disposition:  HOME/SELF CARE  Per UR Regulation:  Reviewed for med. necessity/level of care/duration of stay  If discussed at Long Length of Stay Meetings, dates discussed:    Comments:

## 2012-03-12 NOTE — Progress Notes (Signed)
[facility] PROGRESS NOTE  Mark [name]:[id number] DOB: 10-31-1965 DOA: 03/07/2012 PCP: Lenora Boys, MD  HPI/Subjective: Feeling better. Thought he heard his dog during the night.   Assessment/Plan: Alcohol withdrawal  -Significant alcohol intake, about a fifth of whiskey daily, last drink was 03/05/2012  -Had previous significant DTs with seizure.  -His CIWA-AR is more than 15, started on CIWA protocol with extra as needed Ativan. Was as high as 24  -His urine tox is negative for opiates question if he is withdrawing from opiates too. Continue Ativan. Continue methadone. -Patient reports that he will have a family member who will keep a close watch for the next 3-4 days.  He will be given Librium to take for the next 3-4 days as needed for anxiety or withdrawal.  Suspect intermittent hallucinations are likely due to withdrawal. -Social worker provided patient with resources for outpatient followup for alcohol abuse.  Atypical chest pain  -Patient has chronic chest pain, appears to be musculoskeletal.  -Chest pain is completely reproducible, first set of troponin and 12-lead EKG is negative for cardiac ischemia.  -3 serial sets of troponin, repeat EKG showed no evidence of ischemia.  -Discussed with Dr. Sharyn Lull, he recommended to continue withdrawal management.   Dilated cardiomyopathy  -With recent history of V. tach, his ejection fraction is 25% his end-diastolic volume is 151 mL. -Patient is on metoprolol, losartan and aspirin we will continue.  -Patient will be on IV fluids, will hydrate gently we'll reassess the need for fluids in a.m.  -Per Dr. Sharyn Lull followup as outpatient with 2-D echocardiogram, he might be a candidate for ICD if systolic function did not improve.  To followup with Dr. her wanting in 2-3 weeks after discharge.  Tobacco abuse  -Counseled extensively on smoking cessation.   Chronic neck/back pain  -Patient has previous neck and back fusion  surgery.  -Complains about some hand tingling and numbness increases with movement.  -This is might be secondary to cervical radiculopathy, marginally low, but cannot rule out tactile disturbances from DTs but less likely  -Supplement B12   Code Status: Full code Family Communication:  Disposition Plan: Discharge the patient today.   Consultants:  None  Procedures:  None  Antibiotics:  None   Objective: Filed Vitals:   03/11/12 2000 03/12/12 0400 03/12/12 0457 03/12/12 1047  BP: 136/86  119/85 138/72  Pulse: 72 60 57 69  Temp: 98 F (36.7 C)  97.8 F (36.6 C)   TempSrc:   Oral   Resp: 18  18   Height:      Weight:      SpO2: 100%  97%     Intake/Output Summary (Last 24 hours) at 03/12/12 1419 Last data filed at 03/12/12 0730  Gross per 24 hour  Intake   1800 ml  Output      0 ml  Net   1800 ml   Filed Weights   03/08/12 0600 03/08/12 1915  Weight: 88.724 kg (195 lb 9.6 oz) 88.72 kg (195 lb 9.5 oz)    Exam:  General: Alert and awake, oriented x3, not in any acute distress. HEENT: anicteric sclera, pupils reactive to light and accommodation, EOMI CVS: S1-S2 clear, no murmur rubs or gallops Chest: clear to auscultation bilaterally, no wheezing, rales or rhonchi Abdomen: soft nontender, nondistended, normal bowel sounds, no organomegaly Extremities: no cyanosis, clubbing or edema noted bilaterally Neuro: Cranial nerves II-XII intact, no focal neurological deficits  Data Reviewed: Basic Metabolic Panel:  Lab  03/12/12 0448 03/11/12 0819 03/10/12 0500 03/08/12 0742 03/07/12 1401 03/07/12 0948  NA 139 139 135 140 -- 137  K 3.5 3.8 3.8 3.8 3.3* --  CL 99 99 98 102 -- 95*  CO2 27 29 25 28  -- 29  GLUCOSE 93 109* 110* 113* -- 105*  BUN 12 13 16 10  -- 10  CREATININE 0.71 0.67 0.75 0.65 -- 0.60  CALCIUM 10.2 10.1 10.3 9.6 -- 9.7  MG 2.3 -- -- -- -- --  PHOS -- -- -- -- -- --   Liver Function Tests:  Lab 03/08/12 0742 03/07/12 0948  AST 28 48*  ALT 16  23  ALKPHOS 98 85  BILITOT 1.5* 0.6  PROT 6.8 7.8  ALBUMIN 4.1 4.5    Lab 03/07/12 0948  LIPASE 33  AMYLASE --   No results found for this basename: AMMONIA:5 in the last 168 hours CBC:  Lab 03/08/12 0742 03/07/12 0948  WBC 6.9 4.4  NEUTROABS -- 2.8  HGB 14.4 14.2  HCT 40.9 41.3  MCV 90.5 90.2  PLT 136* 159   Cardiac Enzymes:  Lab 03/08/12 0742 03/08/12 0013 03/07/12 1946  CKTOTAL -- -- --  CKMB -- -- --  CKMBINDEX -- -- --  TROPONINI <0.30 <0.30 <0.30   BNP (last 3 results)  Basename 01/19/12 1905 12/09/11 1655  PROBNP 135.3* 110.2   CBG: No results found for this basename: GLUCAP:5 in the last 168 hours  Recent Results (from the past 240 hour(s))  MRSA PCR SCREENING     Status: Normal   Collection Time   03/07/12  6:26 PM      Component Value Range Status Comment   MRSA by PCR NEGATIVE  NEGATIVE Final      Studies: No results found.  Scheduled Meds:    . aspirin EC  325 mg Oral Daily  . FLUoxetine  20 mg Oral Daily  . folic acid  1 mg Oral Daily  . heparin  5,000 Units Subcutaneous Q8H  . losartan  100 mg Oral Daily  . methadone  20 mg Oral Daily  . metoprolol  50 mg Oral BID  . multivitamin with minerals  1 tablet Oral Daily  . nicotine  21 mg Transdermal Daily  . sodium chloride  3 mL Intravenous Q12H  . thiamine  100 mg Oral Daily  . vitamin B-12  1,000 mcg Oral Daily   Continuous Infusions:    . sodium chloride 20 mL/hr at 03/08/12 1200    Principal Problem:  *Alcohol withdrawal Active Problems:  Atypical chest pain  Alcohol abuse  Tobacco abuse  Dilated cardiomyopathy secondary to alcohol    Time spent: 35 minutes    [name] A  [facility] Pager [phone]. If 8PM-8AM, please contact night-coverage at [email or web address], password [facility] 03/12/2012, 2:19 PM  LOS: 5 days     Discharge Orders    Future Orders Please Complete By Expires   Diet - low sodium heart healthy      Increase activity slowly      Discharge  instructions      Comments:   Please do not smoke or drink alcohol.  Please do not drive if you are taking librium.  Please followup with resources for alcohol cessation with resources provided by Child psychotherapist.  If you have worsening withdrawal symptoms please seek medical care.  Please followup with Dr. Sharyn Lull, cardiology in 2-3 weeks after discharge.  Please followup with FRIED, ROBERT L, MD (PCP) in 1 week after discharge.  Jazz, [place]  Home Medication Instructions [id number]   Printed on:03/12/12 1606  Medication Information                    methadone (DOLOPHINE) 10 MG tablet Take 20 mg by mouth daily.            aspirin EC 325 MG EC tablet Take 1 tablet (325 mg total) by mouth daily.           FLUoxetine (PROZAC) 20 MG capsule Take 1 capsule (20 mg total) by mouth daily.           metoprolol (LOPRESSOR) 50 MG tablet Take 1 tablet (50 mg total) by mouth 2 (two) times daily.           losartan (COZAAR) 100 MG tablet Take 100 mg by mouth daily.           chlordiazePOXIDE (LIBRIUM) 10 MG capsule Take 1 capsule (10 mg total) by mouth 3 (three) times daily as needed for anxiety.           folic acid (FOLVITE) 1 MG tablet Take 1 tablet (1 mg total) by mouth daily.           Multiple Vitamin (MULTIVITAMIN WITH MINERALS) TABS Take 1 tablet by mouth daily.           nicotine (NICODERM CQ - DOSED IN MG/24 HOURS) 21 mg/24hr patch Place 1 patch onto the skin daily. Do not smoke while on the patch.           thiamine 100 MG tablet Take 1 tablet (100 mg total) by mouth daily.           vitamin B-12 1000 MCG tablet Take 1 tablet (1,000 mcg total) by mouth daily.

## 2012-03-12 NOTE — ED Provider Notes (Signed)
Medical screening examination/treatment/procedure(s) were performed by non-physician practitioner and as supervising physician I was immediately available for consultation/collaboration.  Gilda Crease, MD 03/12/12 [phone]

## 2012-03-12 NOTE — Progress Notes (Signed)
Assessment unchanged. Discussed D/C instructions with pt and family. Verbalized understanding. IV and tele removed. Rx given to pt. Rx given to pt from Main Pharmacy. Pt left via foot accompanied by RN.

## 2012-06-09 ENCOUNTER — Encounter (HOSPITAL_COMMUNITY): Payer: Self-pay | Admitting: Emergency Medicine

## 2012-06-09 ENCOUNTER — Inpatient Hospital Stay (HOSPITAL_COMMUNITY)
Admission: EM | Admit: 2012-06-09 | Discharge: 2012-06-25 | DRG: 896 | Disposition: A | Discharge status: Home / Self Care | Payer: MEDICAID | Attending: Internal Medicine | Admitting: Internal Medicine

## 2012-06-09 ENCOUNTER — Inpatient Hospital Stay (HOSPITAL_COMMUNITY): Payer: Self-pay

## 2012-06-09 DIAGNOSIS — I426 Alcoholic cardiomyopathy: Secondary | ICD-10-CM

## 2012-06-09 DIAGNOSIS — N390 Urinary tract infection, site not specified: Secondary | ICD-10-CM | POA: Diagnosis not present

## 2012-06-09 DIAGNOSIS — I428 Other cardiomyopathies: Secondary | ICD-10-CM | POA: Diagnosis present

## 2012-06-09 DIAGNOSIS — F1120 Opioid dependence, uncomplicated: Secondary | ICD-10-CM | POA: Diagnosis present

## 2012-06-09 DIAGNOSIS — R339 Retention of urine, unspecified: Secondary | ICD-10-CM | POA: Diagnosis not present

## 2012-06-09 DIAGNOSIS — I517 Cardiomegaly: Secondary | ICD-10-CM | POA: Diagnosis present

## 2012-06-09 DIAGNOSIS — F1010 Alcohol abuse, uncomplicated: Secondary | ICD-10-CM

## 2012-06-09 DIAGNOSIS — F17200 Nicotine dependence, unspecified, uncomplicated: Secondary | ICD-10-CM | POA: Diagnosis present

## 2012-06-09 DIAGNOSIS — R7989 Other specified abnormal findings of blood chemistry: Secondary | ICD-10-CM | POA: Diagnosis present

## 2012-06-09 DIAGNOSIS — I10 Essential (primary) hypertension: Secondary | ICD-10-CM

## 2012-06-09 DIAGNOSIS — K219 Gastro-esophageal reflux disease without esophagitis: Secondary | ICD-10-CM | POA: Diagnosis present

## 2012-06-09 DIAGNOSIS — F1027 Alcohol dependence with alcohol-induced persisting dementia: Secondary | ICD-10-CM | POA: Diagnosis present

## 2012-06-09 DIAGNOSIS — Z720 Tobacco use: Secondary | ICD-10-CM

## 2012-06-09 DIAGNOSIS — K709 Alcoholic liver disease, unspecified: Secondary | ICD-10-CM | POA: Diagnosis present

## 2012-06-09 DIAGNOSIS — R109 Unspecified abdominal pain: Secondary | ICD-10-CM | POA: Diagnosis present

## 2012-06-09 DIAGNOSIS — I472 Ventricular tachycardia: Secondary | ICD-10-CM

## 2012-06-09 DIAGNOSIS — R7309 Other abnormal glucose: Secondary | ICD-10-CM | POA: Diagnosis not present

## 2012-06-09 DIAGNOSIS — J9600 Acute respiratory failure, unspecified whether with hypoxia or hypercapnia: Secondary | ICD-10-CM | POA: Diagnosis not present

## 2012-06-09 DIAGNOSIS — E876 Hypokalemia: Secondary | ICD-10-CM | POA: Diagnosis present

## 2012-06-09 DIAGNOSIS — Z801 Family history of malignant neoplasm of trachea, bronchus and lung: Secondary | ICD-10-CM

## 2012-06-09 DIAGNOSIS — J189 Pneumonia, unspecified organism: Secondary | ICD-10-CM | POA: Diagnosis not present

## 2012-06-09 DIAGNOSIS — K7010 Alcoholic hepatitis without ascites: Secondary | ICD-10-CM | POA: Diagnosis present

## 2012-06-09 DIAGNOSIS — F10229 Alcohol dependence with intoxication, unspecified: Secondary | ICD-10-CM | POA: Diagnosis present

## 2012-06-09 DIAGNOSIS — R0602 Shortness of breath: Secondary | ICD-10-CM

## 2012-06-09 DIAGNOSIS — G8929 Other chronic pain: Secondary | ICD-10-CM | POA: Diagnosis present

## 2012-06-09 DIAGNOSIS — M542 Cervicalgia: Secondary | ICD-10-CM | POA: Diagnosis present

## 2012-06-09 DIAGNOSIS — F39 Unspecified mood [affective] disorder: Secondary | ICD-10-CM

## 2012-06-09 DIAGNOSIS — I420 Dilated cardiomyopathy: Secondary | ICD-10-CM | POA: Diagnosis present

## 2012-06-09 DIAGNOSIS — R0789 Other chest pain: Secondary | ICD-10-CM

## 2012-06-09 DIAGNOSIS — F10239 Alcohol dependence with withdrawal, unspecified: Principal | ICD-10-CM | POA: Diagnosis present

## 2012-06-09 DIAGNOSIS — F489 Nonpsychotic mental disorder, unspecified: Secondary | ICD-10-CM | POA: Diagnosis present

## 2012-06-09 DIAGNOSIS — R509 Fever, unspecified: Secondary | ICD-10-CM | POA: Diagnosis not present

## 2012-06-09 DIAGNOSIS — D696 Thrombocytopenia, unspecified: Secondary | ICD-10-CM | POA: Diagnosis not present

## 2012-06-09 DIAGNOSIS — Z888 Allergy status to other drugs, medicaments and biological substances status: Secondary | ICD-10-CM

## 2012-06-09 DIAGNOSIS — Z77120 Contact with and (suspected) exposure to mold (toxic): Secondary | ICD-10-CM

## 2012-06-09 DIAGNOSIS — Z7982 Long term (current) use of aspirin: Secondary | ICD-10-CM

## 2012-06-09 DIAGNOSIS — R079 Chest pain, unspecified: Secondary | ICD-10-CM | POA: Diagnosis present

## 2012-06-09 DIAGNOSIS — D649 Anemia, unspecified: Secondary | ICD-10-CM | POA: Diagnosis not present

## 2012-06-09 DIAGNOSIS — I499 Cardiac arrhythmia, unspecified: Secondary | ICD-10-CM | POA: Diagnosis not present

## 2012-06-09 DIAGNOSIS — B370 Candidal stomatitis: Secondary | ICD-10-CM | POA: Diagnosis present

## 2012-06-09 DIAGNOSIS — E785 Hyperlipidemia, unspecified: Secondary | ICD-10-CM

## 2012-06-09 DIAGNOSIS — G9340 Encephalopathy, unspecified: Secondary | ICD-10-CM | POA: Diagnosis present

## 2012-06-09 DIAGNOSIS — F411 Generalized anxiety disorder: Secondary | ICD-10-CM | POA: Diagnosis present

## 2012-06-09 DIAGNOSIS — Z79899 Other long term (current) drug therapy: Secondary | ICD-10-CM

## 2012-06-09 DIAGNOSIS — F10929 Alcohol use, unspecified with intoxication, unspecified: Secondary | ICD-10-CM | POA: Diagnosis present

## 2012-06-09 DIAGNOSIS — F29 Unspecified psychosis not due to a substance or known physiological condition: Secondary | ICD-10-CM | POA: Diagnosis present

## 2012-06-09 DIAGNOSIS — D72829 Elevated white blood cell count, unspecified: Secondary | ICD-10-CM | POA: Diagnosis not present

## 2012-06-09 DIAGNOSIS — F10939 Alcohol use, unspecified with withdrawal, unspecified: Principal | ICD-10-CM | POA: Diagnosis present

## 2012-06-09 DIAGNOSIS — F1011 Alcohol abuse, in remission: Secondary | ICD-10-CM | POA: Diagnosis present

## 2012-06-09 DIAGNOSIS — M549 Dorsalgia, unspecified: Secondary | ICD-10-CM | POA: Diagnosis present

## 2012-06-09 DIAGNOSIS — B958 Unspecified staphylococcus as the cause of diseases classified elsewhere: Secondary | ICD-10-CM | POA: Diagnosis not present

## 2012-06-09 DIAGNOSIS — K5900 Constipation, unspecified: Secondary | ICD-10-CM | POA: Diagnosis not present

## 2012-06-09 HISTORY — DX: Inflammatory liver disease, unspecified: K75.9

## 2012-06-09 HISTORY — DX: Shortness of breath: R06.02

## 2012-06-09 HISTORY — DX: Alcohol dependence with withdrawal, unspecified: F10.239

## 2012-06-09 HISTORY — DX: Other psychoactive substance dependence with withdrawal, unspecified: F19.239

## 2012-06-09 HISTORY — DX: Unspecified convulsions: R56.9

## 2012-06-09 HISTORY — DX: Alcohol use, unspecified with withdrawal, unspecified: F10.939

## 2012-06-09 HISTORY — DX: Headache: R51

## 2012-06-09 HISTORY — DX: Anxiety disorder, unspecified: F41.9

## 2012-06-09 LAB — COMPREHENSIVE METABOLIC PANEL
Albumin: 4.4 g/dL (ref 3.5–5.2)
BUN: 4 mg/dL — ABNORMAL LOW (ref 6–23)
Calcium: 9 mg/dL (ref 8.4–10.5)
Creatinine, Ser: 0.66 mg/dL (ref 0.50–1.35)
Total Bilirubin: 0.9 mg/dL (ref 0.3–1.2)
Total Protein: 7.3 g/dL (ref 6.0–8.3)

## 2012-06-09 LAB — CREATININE, SERUM
Creatinine, Ser: 0.64 mg/dL (ref 0.50–1.35)
GFR calc Af Amer: >90 mL/min (ref >90)

## 2012-06-09 LAB — CBC
HCT: 37.9 % — ABNORMAL LOW (ref 39.0–52.0)
MCV: 91.3 fL (ref 78.0–100.0)
Platelets: 98 10*3/uL — ABNORMAL LOW (ref 150–400)
Platelets: 99 10*3/uL — ABNORMAL LOW (ref 150–400)
RBC: 4.15 MIL/uL — ABNORMAL LOW (ref 4.22–5.81)
RBC: 4.24 MIL/uL (ref 4.22–5.81)
WBC: 3.5 10*3/uL — ABNORMAL LOW (ref 4.0–10.5)
WBC: 4.7 10*3/uL (ref 4.0–10.5)

## 2012-06-09 LAB — TROPONIN I
Troponin I: <0.3 ng/mL (ref <0.30)
Troponin I: <0.3 ng/mL (ref <0.30)

## 2012-06-09 LAB — PHOSPHORUS: Phosphorus: 3.2 mg/dL (ref 2.3–4.6)

## 2012-06-09 LAB — POCT I-STAT TROPONIN I

## 2012-06-09 LAB — PROTIME-INR: INR: 0.96 (ref 0.00–1.49)

## 2012-06-09 LAB — RAPID URINE DRUG SCREEN, HOSP PERFORMED
Amphetamines: NOT DETECTED
Opiates: NOT DETECTED

## 2012-06-09 MED ORDER — FOLIC ACID 1 MG PO TABS
1.0000 mg | ORAL_TABLET | Freq: Every day | ORAL | Status: DC
  Administered 2012-06-09 – 2012-06-15 (×7): 1 mg via ORAL
  Filled 2012-06-09 (×8): qty 1

## 2012-06-09 MED ORDER — METHADONE HCL 10 MG PO TABS
20.0000 mg | ORAL_TABLET | Freq: Every day | ORAL | Status: DC
  Administered 2012-06-09 – 2012-06-11 (×3): 20 mg via ORAL
  Filled 2012-06-09 (×3): qty 4

## 2012-06-09 MED ORDER — FLUCONAZOLE 100 MG PO TABS
100.0000 mg | ORAL_TABLET | Freq: Every day | ORAL | Status: DC
  Administered 2012-06-09 – 2012-06-11 (×3): 100 mg via ORAL
  Filled 2012-06-09 (×3): qty 1

## 2012-06-09 MED ORDER — NICOTINE 14 MG/24HR TD PT24
14.0000 mg | MEDICATED_PATCH | Freq: Every day | TRANSDERMAL | Status: DC
  Administered 2012-06-09 – 2012-06-19 (×11): 14 mg via TRANSDERMAL
  Filled 2012-06-09 (×12): qty 1

## 2012-06-09 MED ORDER — POTASSIUM CHLORIDE 10 MEQ/100ML IV SOLN
10.0000 meq | INTRAVENOUS | Status: AC
  Administered 2012-06-09 (×6): 10 meq via INTRAVENOUS
  Filled 2012-06-09 (×6): qty 100

## 2012-06-09 MED ORDER — HEPARIN SODIUM (PORCINE) 5000 UNIT/ML IJ SOLN
5000.0000 [IU] | Freq: Three times a day (TID) | INTRAMUSCULAR | Status: DC
  Administered 2012-06-09 – 2012-06-25 (×46): 5000 [IU] via SUBCUTANEOUS
  Filled 2012-06-09 (×52): qty 1

## 2012-06-09 MED ORDER — LORAZEPAM 2 MG/ML IJ SOLN
2.0000 mg | Freq: Once | INTRAMUSCULAR | Status: AC
  Administered 2012-06-09: 2 mg via INTRAVENOUS
  Filled 2012-06-09: qty 1

## 2012-06-09 MED ORDER — THIAMINE HCL 100 MG/ML IJ SOLN: 100.0000 mg | Freq: Every day | INTRAMUSCULAR | Status: DC

## 2012-06-09 MED ORDER — VITAMIN B-1 100 MG PO TABS
100.0000 mg | ORAL_TABLET | Freq: Every day | ORAL | Status: DC
  Administered 2012-06-09: 100 mg via ORAL
  Filled 2012-06-09: qty 1

## 2012-06-09 MED ORDER — VITAMIN B-1 100 MG PO TABS
100.0000 mg | ORAL_TABLET | Freq: Every day | ORAL | Status: DC
  Administered 2012-06-09 – 2012-06-15 (×6): 100 mg via ORAL
  Filled 2012-06-09 (×7): qty 1

## 2012-06-09 MED ORDER — FOLIC ACID 1 MG PO TABS
1.0000 mg | ORAL_TABLET | Freq: Every day | ORAL | Status: DC
  Administered 2012-06-09: 1 mg via ORAL
  Filled 2012-06-09: qty 1

## 2012-06-09 MED ORDER — HYDRALAZINE HCL 20 MG/ML IJ SOLN
10.0000 mg | Freq: Once | INTRAMUSCULAR | Status: AC
  Administered 2012-06-09: 10 mg via INTRAVENOUS
  Filled 2012-06-09: qty 1

## 2012-06-09 MED ORDER — LOSARTAN POTASSIUM 50 MG PO TABS
50.0000 mg | ORAL_TABLET | Freq: Two times a day (BID) | ORAL | Status: DC
  Administered 2012-06-09 – 2012-06-15 (×10): 50 mg via ORAL
  Filled 2012-06-09 (×15): qty 1

## 2012-06-09 MED ORDER — LORAZEPAM 2 MG/ML IJ SOLN: 0.0000 mg | Freq: Two times a day (BID) | INTRAMUSCULAR | Status: DC

## 2012-06-09 MED ORDER — THIAMINE HCL 100 MG/ML IJ SOLN
100.0000 mg | Freq: Every day | INTRAMUSCULAR | Status: DC
  Administered 2012-06-12: 100 mg via INTRAVENOUS
  Filled 2012-06-09 (×7): qty 1

## 2012-06-09 MED ORDER — SODIUM CHLORIDE 0.9 % IV SOLN
Freq: Once | INTRAVENOUS | Status: AC
  Administered 2012-06-09: 08:00:00 via INTRAVENOUS

## 2012-06-09 MED ORDER — POTASSIUM CHLORIDE 10 MEQ/100ML IV SOLN
10.0000 meq | INTRAVENOUS | Status: DC
  Filled 2012-06-09 (×5): qty 100

## 2012-06-09 MED ORDER — LORAZEPAM 2 MG/ML IJ SOLN
1.0000 mg | Freq: Four times a day (QID) | INTRAMUSCULAR | Status: DC | PRN
  Administered 2012-06-10 (×2): 1 mg via INTRAVENOUS
  Filled 2012-06-09 (×2): qty 1

## 2012-06-09 MED ORDER — LORAZEPAM 1 MG PO TABS
1.0000 mg | ORAL_TABLET | Freq: Four times a day (QID) | ORAL | Status: DC | PRN
  Administered 2012-06-11: 1 mg via ORAL
  Filled 2012-06-09: qty 1

## 2012-06-09 MED ORDER — LOSARTAN POTASSIUM 50 MG PO TABS
50.0000 mg | ORAL_TABLET | Freq: Two times a day (BID) | ORAL | Status: DC
  Administered 2012-06-09: 50 mg via ORAL

## 2012-06-09 MED ORDER — SODIUM CHLORIDE 0.9 % IV SOLN
INTRAVENOUS | Status: DC
  Administered 2012-06-09: 15:00:00 via INTRAVENOUS

## 2012-06-09 MED ORDER — CARVEDILOL 12.5 MG PO TABS
12.5000 mg | ORAL_TABLET | Freq: Two times a day (BID) | ORAL | Status: DC
  Administered 2012-06-09 – 2012-06-15 (×10): 12.5 mg via ORAL
  Filled 2012-06-09 (×15): qty 1

## 2012-06-09 MED ORDER — LORAZEPAM 2 MG/ML IJ SOLN
0.0000 mg | Freq: Four times a day (QID) | INTRAMUSCULAR | Status: AC
  Administered 2012-06-10 (×4): 2 mg via INTRAVENOUS
  Administered 2012-06-11: 4 mg via INTRAVENOUS
  Administered 2012-06-11 (×2): 2 mg via INTRAVENOUS
  Filled 2012-06-09 (×8): qty 1

## 2012-06-09 MED ORDER — ONDANSETRON HCL 4 MG/2ML IJ SOLN
4.0000 mg | Freq: Four times a day (QID) | INTRAMUSCULAR | Status: DC | PRN
  Administered 2012-06-24: 4 mg via INTRAVENOUS
  Filled 2012-06-09: qty 2

## 2012-06-09 MED ORDER — ADULT MULTIVITAMIN W/MINERALS CH
1.0000 | ORAL_TABLET | Freq: Every day | ORAL | Status: DC
  Administered 2012-06-09: 1 via ORAL
  Filled 2012-06-09: qty 1

## 2012-06-09 MED ORDER — LORAZEPAM 1 MG PO TABS
1.0000 mg | ORAL_TABLET | Freq: Four times a day (QID) | ORAL | Status: DC | PRN
  Administered 2012-06-09: 1 mg via ORAL
  Filled 2012-06-09: qty 1

## 2012-06-09 MED ORDER — MORPHINE SULFATE 2 MG/ML IJ SOLN
2.0000 mg | INTRAMUSCULAR | Status: DC | PRN
  Filled 2012-06-09: qty 1
  Filled 2012-06-09: qty 2

## 2012-06-09 MED ORDER — LORAZEPAM 2 MG/ML IJ SOLN
2.0000 mg | Freq: Four times a day (QID) | INTRAMUSCULAR | Status: DC | PRN
  Administered 2012-06-09: 2 mg via INTRAVENOUS
  Filled 2012-06-09: qty 1

## 2012-06-09 MED ORDER — ADULT MULTIVITAMIN W/MINERALS CH
1.0000 | ORAL_TABLET | Freq: Every day | ORAL | Status: DC
  Administered 2012-06-09 – 2012-06-15 (×6): 1 via ORAL
  Filled 2012-06-09 (×7): qty 1

## 2012-06-09 MED ORDER — POTASSIUM CHLORIDE CRYS ER 20 MEQ PO TBCR
40.0000 meq | EXTENDED_RELEASE_TABLET | Freq: Once | ORAL | Status: AC
  Administered 2012-06-09: 40 meq via ORAL
  Filled 2012-06-09: qty 2

## 2012-06-09 MED ORDER — ASPIRIN EC 325 MG PO TBEC
325.0000 mg | DELAYED_RELEASE_TABLET | Freq: Every day | ORAL | Status: DC
  Administered 2012-06-09 – 2012-06-15 (×6): 325 mg via ORAL
  Filled 2012-06-09 (×7): qty 1

## 2012-06-09 MED ORDER — ONDANSETRON HCL 4 MG PO TABS: 4.0000 mg | ORAL_TABLET | Freq: Four times a day (QID) | ORAL | Status: DC | PRN

## 2012-06-09 MED ORDER — SODIUM CHLORIDE 0.9 % IJ SOLN
3.0000 mL | Freq: Two times a day (BID) | INTRAMUSCULAR | Status: DC
  Administered 2012-06-09 – 2012-06-16 (×9): 3 mL via INTRAVENOUS

## 2012-06-09 MED ORDER — LORAZEPAM 2 MG/ML IJ SOLN
INTRAMUSCULAR | Status: AC
  Administered 2012-06-09: 2 mg
  Filled 2012-06-09: qty 1

## 2012-06-09 MED ORDER — OXYCODONE HCL 5 MG PO TABS
5.0000 mg | ORAL_TABLET | ORAL | Status: DC | PRN
  Administered 2012-06-09 – 2012-06-11 (×6): 5 mg via ORAL
  Filled 2012-06-09 (×6): qty 1

## 2012-06-09 NOTE — Progress Notes (Addendum)
Dr.Oti has been page,pt. BP 165/124.HR 130.PT. Is tachy on telemetry.Still with the withdrawn sim toms.waiting for rapid response nurse and the MD. Orders.keep monitoring pt. Closely.Orders were received 2 mg. Of ativan were given.

## 2012-06-09 NOTE — ED Provider Notes (Signed)
History     CSN: [id number]  Arrival date & time 06/09/12  4098   First MD Initiated Contact with Patient 06/09/12 0720      Chief Complaint  Patient presents with  . Chest Pain    (Consider location/radiation/quality/duration/timing/severity/associated sxs/prior treatment) HPI Comments: Reports multiple complaints.  Has had a headache for the last week.  Chronic chest pain located in right and left chest that is intermittent, but worsening over the last several days.  No associated shortness of breath or diaphoresis.  States "feels like someone punched me in the ribs."  Also reports bloating and diffuse abdominal pain as well as numbness and burning of bilateral lower legs for the last year.  Was admitted for chest pain in 02/2012, negative for cardiac cause, but found to have a dilated cardiomyopathy, followed by Dr. Sharyn Lull.  Likely secondary to alcohol abuse.  Drinks a fifth of liquor daily, last drink 12am this morning, feels like he is starting to go through withdrawal. History of DT with seizure.  No known history of varices or cirrhosis.  Patient is a 47 y.o. male presenting with chest pain. The history is provided by the patient. No language interpreter was used.  Chest Pain Pain location:  L chest and R chest Pain quality: sharp and throbbing   Pain radiates to:  Does not radiate Pain radiates to the back: no   Pain severity:  Severe Onset quality: chronic chest pain that has worsened in last 2-3 days. Timing:  Intermittent Progression:  Worsening Chronicity:  Chronic Relieved by:  Nothing Worsened by:  Movement Associated symptoms: abdominal pain, headache, numbness (in bilateral lower legs x1 year) and palpitations   Associated symptoms: no cough, no diaphoresis, no dizziness, no dysphagia, no fever, no shortness of breath, no syncope and not vomiting   Risk factors: hypertension and smoking     Past Medical History  Diagnosis Date  . Hypertension   .  Hyperlipidemia   . 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 ETOH abuse   . GERD (gastroesophageal reflux disease)   . Mental disorder   . Irregular heart beat     Past Surgical History  Procedure Laterality Date  . Back surgery    . Neck surgery    . Fundoplasty transthoracic  2003  . Nasal sinus surgery      Family History  Problem Relation Age of Onset  . Breast cancer Paternal Grandmother   . Lung cancer      was a smoker  . Hypertension Mother     History  Substance Use Topics  . Smoking status: Current Every Day Smoker -- 2.00 packs/day for 30 years    Types: Cigarettes  . Smokeless tobacco: Never Used  . Alcohol Use: 4.2 oz/week    7 Shots of liquor per week     Comment: drinks 1pint + each day      Review of Systems  Constitutional: Positive for chills. Negative for fever and diaphoresis.  HENT: Positive for neck pain (chronic). Negative for congestion and trouble swallowing.   Eyes: Negative.   Respiratory: Negative for cough, shortness of breath and wheezing.   Cardiovascular: Positive for chest pain and palpitations. Negative for leg swelling and syncope.  Gastrointestinal: Positive for abdominal pain and abdominal distention. Negative for vomiting, diarrhea and blood in stool.  Genitourinary: Negative.   Skin: Negative.   Allergic/Immunologic: Negative.   Neurological: Positive for numbness (in  bilateral lower legs x1 year) and headaches. Negative for dizziness and light-headedness.  Hematological: Negative.   Psychiatric/Behavioral: The patient is nervous/anxious.     Allergies  Lisinopril  Home Medications   Current Outpatient Rx  Name  Route  Sig  Dispense  Refill  . aspirin EC 325 MG EC tablet   Oral   Take 1 tablet (325 mg total) by mouth daily.   30 tablet      . carvedilol (COREG) 12.5 MG tablet   Oral   Take 12.5 mg by mouth 2 (two) times daily with a meal.         . losartan (COZAAR) 100 MG  tablet   Oral   Take 50 mg by mouth 2 (two) times daily.          . methadone (DOLOPHINE) 10 MG tablet   Oral   Take 20 mg by mouth daily.            BP 171/109  Pulse 94  Temp(Src) 98.4 F (36.9 C) (Oral)  Resp 22  SpO2 95%  Physical Exam  Constitutional: He is oriented to person, place, and time. He appears well-developed and well-nourished. He appears distressed (mild).  HENT:  Head: Normocephalic and atraumatic.  Right Ear: External ear normal.  Left Ear: External ear normal.  Nose: Nose normal.  Mouth/Throat: Oropharynx is clear and moist. No oropharyngeal exudate.  Eyes: Conjunctivae and EOM are normal. Pupils are equal, round, and reactive to light. Right eye exhibits no discharge. Left eye exhibits no discharge. No scleral icterus.  Neck: Neck supple. No tracheal deviation present.  Cardiovascular: Regular rhythm, normal heart sounds and intact distal pulses.  Exam reveals no gallop.   No murmur heard. Pulmonary/Chest: Effort normal and breath sounds normal. He has no wheezes. He has no rales. He exhibits tenderness.  Abdominal: Bowel sounds are normal. He exhibits distension (mild). He exhibits no mass. There is tenderness (diffuse). There is guarding (voluntary). There is no rebound.  Musculoskeletal: He exhibits no edema.  Lymphadenopathy:    He has no cervical adenopathy.  Neurological: He is alert and oriented to person, place, and time. He exhibits normal muscle tone.  Skin: Skin is warm and dry. No rash noted. He is not diaphoretic. No erythema.  Psychiatric: He has a normal mood and affect. His behavior is normal. Judgment and thought content normal.    ED Course  Procedures (including critical care time)  Labs Reviewed  COMPREHENSIVE METABOLIC PANEL - Abnormal; Notable for the following:    Potassium 2.9 (*)    Chloride 94 (*)    BUN 4 (*)    AST 185 (*)    ALT 127 (*)    All other components within normal limits  CBC - Abnormal; Notable for the  following:    WBC 3.5 (*)    RBC 4.15 (*)    HCT 37.9 (*)    Platelets 98 (*)    All other components within normal limits  ETHANOL - Abnormal; Notable for the following:    Alcohol, Ethyl (B) 182 (*)    All other components within normal limits  PROTIME-INR  URINE RAPID DRUG SCREEN (HOSP PERFORMED)  POCT I-STAT TROPONIN I   No results found.   No diagnosis found.   Date: 06/09/2012  Rate: 93  Rhythm: normal sinus rhythm and premature ventricular contractions (PVC)  QRS Axis: normal  Intervals: QT prolonged  ST/T Wave abnormalities: normal  Conduction Disutrbances:none  Narrative Interpretation:  Old EKG Reviewed: unchanged    MDM  47 yo M with HTN, alcoholism and cardiomyopathy presenting with CP and desire for detox.  Chest pain is reproducible on exam, EKG negative for acute ischemia, troponin negative.  Abdominal pain and bloating concerning for liver disease, CMP notable for elevated liver enzymes, INR is normal.  No fevers or white count to raise concern for SBP.  Headache may be secondary to elevated BP.  BP much improved with hydralazine/ativan.  Patient sleeping after hydralazine and ativan.  Given history of DTs with seizure, will need to be admitted for withdrawal. 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 Will call [facility] for admission. Discussed with Dr. Owens Shark who will admit the patient.  BOOTH, ERIN 06/09/2012, 11:18 AM    Phebe Colla, MD 06/09/12 [phone]

## 2012-06-09 NOTE — ED Provider Notes (Addendum)
I saw and evaluated the patient, reviewed the resident's note and I agree with the findings and plan.   .Face to face Exam:  General:  Awake HEENT:  Atraumatic Resp:  Normal effort Abd:  Nondistended Neuro:No focal weakness    Nelia Shi, MD 06/09/12 1210  CRITICAL CARE Performed by: Nelia Shi   Total critical care time: 45 min  Critical care time was exclusive of separately billable procedures and treating other patients.  Critical care was necessary to treat or prevent imminent or life-threatening deterioration.  Critical care was time spent personally by me on the following activities: development of treatment plan with patient and/or surrogate as well as nursing, discussions with consultants, evaluation of patient's response to treatment, examination of patient, obtaining history from patient or surrogate, ordering and performing treatments and interventions, ordering and review of laboratory studies, ordering and review of radiographic studies, pulse oximetry and re-evaluation of patient's condition.   Nelia Shi, MD 06/09/12 1210

## 2012-06-09 NOTE — ED Notes (Signed)
Report to Natalie, RN

## 2012-06-09 NOTE — H&P (Signed)
PCP:   Lenora Boys, MD   Chief Complaint:  Wants "Detox".   HPI: This is a 47 year old male, with known history of tobacco abuse, HTN, dyslipidemia, ETOH abuse, possible opioid dependency, chronic back/neck pain, s/p back surgery, s/p cervical spine surgery, non-ischemic/Dilated  cardiomyopathy, EF 25% on 01/21/2012, under care of Dr Rinaldo Cloud, recurrent chest pain, s/p negative ETT 01/20/13. Patient is a vague historian. According to him, he has had upper abdominal pain for the last 4-5 days, with occasional diarrhea, and daily retching/heaving and occasional bilious vomitus.  He continues to have recurrent bilateral chest pain, but no fever, chills. He has a mild cough, productive of whitish phlegm, and is occasionally SOBOE. He continues to drink ETOH on a daily basis. Last ETOH was imbibed at midnight on 06/09/12.     Allergies:   Allergies  Allergen Reactions  . Lisinopril Anaphylaxis and Swelling      Past Medical History  Diagnosis Date  . Hypertension   . Hyperlipidemia   . 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 ETOH abuse   . GERD (gastroesophageal reflux disease)   . Mental disorder   . Irregular heart beat     Past Surgical History  Procedure Laterality Date  . Back surgery    . Neck surgery    . Fundoplasty transthoracic  2003  . Nasal sinus surgery      Prior to Admission medications   Medication Sig Start Date End Date Taking? Authorizing Provider  aspirin EC 325 MG EC tablet Take 1 tablet (325 mg total) by mouth daily. 01/24/12  Yes Russella Dar, NP  carvedilol (COREG) 12.5 MG tablet Take 12.5 mg by mouth 2 (two) times daily with a meal.   Yes Historical Provider, MD  losartan (COZAAR) 100 MG tablet Take 50 mg by mouth 2 (two) times daily.    Yes Historical Provider, MD  methadone (DOLOPHINE) 10 MG tablet Take 20 mg by mouth daily.    Yes Historical Provider, MD    Social History: Patient reports that he  has been smoking Cigarettes.  He has a 60 pack-year smoking history. He has never used smokeless tobacco. He reports that he drinks about 4.2 ounces of alcohol per week. He reports that he uses illicit drugs.  Family History  Problem Relation Age of Onset  . Breast cancer Paternal Grandmother   . Lung cancer      was a smoker  . Hypertension Mother     Review of Systems:  As per HPI and chief complaint. Patent denies diminished appetite, weight loss, fever, chills, headache, blurred vision, difficulty in speaking, dysphagia, orthopnea, paroxysmal nocturnal dyspnea, diaphoresis, belching, heartburn, hematemesis, melena, dysuria, nocturia, urinary frequency, hematochezia, lower extremity swelling, pain, or redness. The rest of the systems review is negative.  Physical Exam:  General:  Patient does not appear to be in obvious acute distress. Was sleeping, at the beginning of this encounter, but easily roused, communicative, fully oriented, talking in complete sentences, not short of breath at rest.  HEENT:  No clinical pallor, no jaundice, no conjunctival injection or discharge. Has oral thrush.  NECK:  Supple, JVP not seen, no carotid bruits, no palpable lymphadenopathy, no palpable goiter. CHEST:  Clinically clear to auscultation, no wheezes, no crackles. HEART:  Sounds 1 and 2 heard, normal, regular, no murmurs. ABDOMEN:  Full, softly distended, dilated superficial veins, diffusely tender, no palpable organomegaly, no palpable  masses, normal bowel sounds. Suspicion of shifting dullness.  GENITALIA:  Not examined. LOWER EXTREMITIES:  No pitting edema, palpable peripheral pulses. MUSCULOSKELETAL SYSTEM:  Unremarkable. . CENTRAL NERVOUS SYSTEM:  Very tremulous, otherwise, no focal neurologic deficit on gross examination.   Labs on Admission:  Results for orders placed during the hospital encounter of 06/09/12 (from the past 48 hour(s))  COMPREHENSIVE METABOLIC PANEL     Status: Abnormal    Collection Time    06/09/12  8:02 AM      Result Value Range   Sodium 140  135 - 145 mEq/L   Potassium 2.9 (*) 3.5 - 5.1 mEq/L   Chloride 94 (*) 96 - 112 mEq/L   CO2 27  19 - 32 mEq/L   Glucose, Bld 97  70 - 99 mg/dL   BUN 4 (*) 6 - 23 mg/dL   Creatinine, Ser 1.61  0.50 - 1.35 mg/dL   Calcium 9.0  8.4 - 09.6 mg/dL   Total Protein 7.3  6.0 - 8.3 g/dL   Albumin 4.4  3.5 - 5.2 g/dL   AST 045 (*) 0 - 37 U/L   ALT 127 (*) 0 - 53 U/L   Alkaline Phosphatase 105  39 - 117 U/L   Total Bilirubin 0.9  0.3 - 1.2 mg/dL   GFR calc non Af Amer >90  >90 mL/min   GFR calc Af Amer >90  >90 mL/min   Comment:            The eGFR has been calculated     using the CKD EPI equation.     This calculation has not been     validated in all clinical     situations.     eGFR's persistently     <90 mL/min signify     possible Chronic Kidney Disease.  CBC     Status: Abnormal   Collection Time    06/09/12  8:02 AM      Result Value Range   WBC 3.5 (*) 4.0 - 10.5 K/uL   RBC 4.15 (*) 4.22 - 5.81 MIL/uL   Hemoglobin 13.6  13.0 - 17.0 g/dL   HCT 40.9 (*) 81.1 - 91.4 %   MCV 91.3  78.0 - 100.0 fL   MCH 32.8  26.0 - 34.0 pg   MCHC 35.9  30.0 - 36.0 g/dL   RDW 78.2  95.6 - 21.3 %   Platelets 98 (*) 150 - 400 K/uL   Comment: PLATELET COUNT CONFIRMED BY SMEAR  ETHANOL     Status: Abnormal   Collection Time    06/09/12  8:02 AM      Result Value Range   Alcohol, Ethyl (B) 182 (*) 0 - 11 mg/dL   Comment:            LOWEST DETECTABLE LIMIT FOR     SERUM ALCOHOL IS 11 mg/dL     FOR MEDICAL PURPOSES ONLY  PROTIME-INR     Status: None   Collection Time    06/09/12  8:02 AM      Result Value Range   Prothrombin Time 12.7  11.6 - 15.2 seconds   INR 0.96  0.00 - 1.49  POCT I-STAT TROPONIN I     Status: None   Collection Time    06/09/12  9:12 AM      Result Value Range   Troponin i, poc 0.01  0.00 - 0.08 ng/mL   Comment 3  Comment: Due to the release kinetics of cTnI,     a negative result  within the first hours     of the onset of symptoms does not rule out     myocardial infarction with certainty.     If myocardial infarction is still suspected,     repeat the test at appropriate intervals.  URINE RAPID DRUG SCREEN (HOSP PERFORMED)     Status: None   Collection Time    06/09/12 10:09 AM      Result Value Range   Opiates NONE DETECTED  NONE DETECTED   Cocaine NONE DETECTED  NONE DETECTED   Benzodiazepines NONE DETECTED  NONE DETECTED   Amphetamines NONE DETECTED  NONE DETECTED   Tetrahydrocannabinol NONE DETECTED  NONE DETECTED   Barbiturates NONE DETECTED  NONE DETECTED   Comment:            DRUG SCREEN FOR MEDICAL PURPOSES     ONLY.  IF CONFIRMATION IS NEEDED     FOR ANY PURPOSE, NOTIFY LAB     WITHIN 5 DAYS.                LOWEST DETECTABLE LIMITS     FOR URINE DRUG SCREEN     Drug Class       Cutoff (ng/mL)     Amphetamine      1000     Barbiturate      200     Benzodiazepine   200     Tricyclics       300     Opiates          300     Cocaine          300     THC              50    Radiological Exams on Admission: No results found.  Assessment/Plan Active Problems:    1. Acute alcoholic intoxication/H/O ETOH abuse: Patient has a known history of chronic alcohol abuse, and has had DTs in the past. He drinks a fifth of liquor daily, land the last drink was at 12:00 AM 06/09/12. ETOH level was 182 on presentation, consistent with acute alcoholic intoxication. He is already tremulous in the ED, consistent with withdrawal phenomena. Will admit to telemetry floor, and commence CIWA protocol, as well as vitamin supplementation.  2. Abdominal pain/Transaminitis: On admission, patient was noted to have abnormal LFTs, AST 185, ALT 127, Alk Phos 105 and T. Bil 0.9. Per EMR, LFTs were normal on 02/15/13. Suspect the etiology is alcoholic liver disease. Patient has abdominal pain, has had a few days of diarrhea. Abdomen appears mildly distended and there is a suspicion  of shifting dullness. Will check viral hepatitis serology, and do abdominal U/S. For completeness, will also check serum lipase. Following LFTs.  3. Hypokalemia: This is likely due to ETOH abuse. Potassium is 2.9. Repleting as indicated. Will also check magnesium levels.  4. Tobacco abuse: Patient smokes a half pack of cigarettes per day. Have counseled appropriately. Will place on Nicoderm CQ patch.  5. Chest pain/Cardiomyopathy, dilated, nonischemic: Patient has known non-ischemic/Dilated  cardiomyopathy, EF 25% on 01/21/2012, under care of Dr Rinaldo Cloud, whom he says that he saw about 2 weeks ago. He is s/p negative ETT 01/20/13. Dr Sharyn Lull was planning outpatient 2D echocardiogram, but this has not yet been done. Clinically, CHF does not appear to be decompensated. Will arrange 2D echocardiogram and CXR. Will need to pay  close attention to volume status. Cardiac enzymes will be cycled, in view of atypical chest pain.  6. HTN: This  Appears reasonably controlled. Will continue pre-admission antihypertensives.  7. Oral thrush: For a 7-day course of Diflucan.   Further management will depend on clinical course.   Comment: patient is FULL CODE.   Time Spent on Admission: 1 hour.   Amyah Clawson,CHRISTOPHER 06/09/2012, 11:52 AM

## 2012-06-09 NOTE — Significant Event (Signed)
Rapid Response Event Note  Overview:  Called by primary Rn to assess patient with withdrawal symptoms.  Upon arrival to patients room Rn at bedside.  Patient alert and pleasant and exhibiting withdrawal aymtoms.  CIWA 12 Time Called: 1844 Arrival Time: 1846 Event Type: Other (Comment)   Interventions:  Dr Brien Few paged and notified, orders received and 2 mg IV ativan given.  RN to call if assistance needed   Event Summary:   at      at          [facility], [name]

## 2012-06-09 NOTE — Progress Notes (Addendum)
Received the pt. From the ED,pt. From home with family.PT. Is independent.Pt. Skin is dry and intact.Pt. On active alcohol withdraw(CIWA= 10).Dr. Brien Few was page to get an order for extra IV ativan since the ED has gave ativan PO already.Keep monitoring pt. Closely and assessing his needs.Tennis Must RN

## 2012-06-09 NOTE — Progress Notes (Signed)
*  PRELIMINARY RESULTS* Echocardiogram 2D Echocardiogram has been performed.  Jeryl Columbia 06/09/2012, 4:16 PM

## 2012-06-09 NOTE — ED Notes (Signed)
Pt. Stated, i started having CP last night and it continued.  I'm also an alcoholic, the last time I had something to drink was midnight and I drink a 5th a day.  That might be some of my problem I've not had anything to drink.  I also suffer from anxiety.  I feel real bloated for some reason.  I've also had a headache for a week.

## 2012-06-10 ENCOUNTER — Inpatient Hospital Stay (HOSPITAL_COMMUNITY): Payer: Self-pay

## 2012-06-10 DIAGNOSIS — R109 Unspecified abdominal pain: Secondary | ICD-10-CM

## 2012-06-10 DIAGNOSIS — K7010 Alcoholic hepatitis without ascites: Secondary | ICD-10-CM

## 2012-06-10 LAB — COMPREHENSIVE METABOLIC PANEL
AST: 132 U/L — ABNORMAL HIGH (ref 0–37)
BUN: 7 mg/dL (ref 6–23)
CO2: 24 mEq/L (ref 19–32)
Calcium: 9.3 mg/dL (ref 8.4–10.5)
Creatinine, Ser: 0.66 mg/dL (ref 0.50–1.35)
GFR calc non Af Amer: >90 mL/min (ref >90)

## 2012-06-10 LAB — TROPONIN I: Troponin I: <0.3 ng/mL (ref <0.30)

## 2012-06-10 LAB — CBC
HCT: 41 % (ref 39.0–52.0)
MCH: 32.5 pg (ref 26.0–34.0)
MCV: 91.3 fL (ref 78.0–100.0)
Platelets: 118 10*3/uL — ABNORMAL LOW (ref 150–400)
RDW: 14.7 % (ref 11.5–15.5)

## 2012-06-10 MED ORDER — PANTOPRAZOLE SODIUM 40 MG PO TBEC
40.0000 mg | DELAYED_RELEASE_TABLET | Freq: Two times a day (BID) | ORAL | Status: DC
  Administered 2012-06-10 – 2012-06-11 (×4): 40 mg via ORAL
  Filled 2012-06-10 (×4): qty 1

## 2012-06-10 NOTE — Progress Notes (Addendum)
[facility] PROGRESS NOTE  Mark [name] WUJ:[id number] DOB: 1965-12-13 DOA: 06/09/2012 PCP: Lenora Boys, MD  Assessment/Plan: 1. Acute alcoholic intoxication/H/O ETOH abuse: Patient has a known history of chronic alcohol abuse, and has had DTs in the past. He drinks a fifth of liquor daily,  last drink was at 12:00 AM 06/09/12. ETOH level was 182 on presentation, consistent with acute alcoholic intoxication. telemetry floor, and CIWA protocol, as well as vitamin supplementation. Social work consult for on-going abuse   2. Abdominal pain/Transaminitis: On admission, patient was noted to have abnormal LFTs, AST 185, ALT 127, Alk Phos 105 and T. Bil 0.9. Per EMR, LFTs were normal on 02/15/13. Suspect the etiology is alcoholic liver disease. Patient has abdominal pain, has had a few days of diarrhea. Abdomen appears mildly distended and there is a suspicion of shifting dullness.  check viral hepatitis serology,  abdominal U/S ok. improving LFTs.   3. Hypokalemia: This is likely due to ETOH abuse. repleted  4. Tobacco abuse: Patient smokes a half pack of cigarettes per day. Have counseled appropriately. Will place on Nicoderm CQ patch.   5. Chest pain/Cardiomyopathy, dilated, nonischemic: Patient has known non-ischemic/Dilated cardiomyopathy, EF 25% on 01/21/2012, under care of Dr Rinaldo Cloud, whom he says that he saw about 2 weeks ago. He is s/p negative ETT 01/20/13. Dr Sharyn Lull was planning outpatient 2D echocardiogram, but this has not yet been done. Clinically, CHF does not appear to be decompensated. Will arrange 2D echocardiogram and CXR. Will need to pay close attention to volume status. Cardiac enzymes will be cycled, in view of atypical chest pain. Add protonix- ?alcoholic gastritis  6. HTN: This Appears reasonably controlled. Will continue pre-admission antihypertensives.   7. Oral thrush: For a 7-day course of Diflucan.    Code Status: full Family Communication: patient at  bedside Disposition Plan: home 1-2 days   Consultants:  none  Procedures:  echo  Antibiotics: None  HPI/Subjective: Still having some abdominal pain  Objective: Filed Vitals:   06/09/12 1240 06/09/12 1838 06/09/12 2026 06/10/12 0418  BP: 154/80 165/118 144/81 125/75  Pulse: 80 124 100 84  Temp:   98.6 F (37 C) 97.5 F (36.4 C)  TempSrc:   Oral Oral  Resp:  24 22 22   Height:   6\' 4"  (1.93 m)   Weight:   95.312 kg (210 lb 2 oz)   SpO2:   96% 100%    Intake/Output Summary (Last 24 hours) at 06/10/12 0949 Last data filed at 06/10/12 9562  Gross per 24 hour  Intake    480 ml  Output    600 ml  Net   -120 ml   Filed Weights   06/09/12 2026  Weight: 95.312 kg (210 lb 2 oz)    Exam:   General:  A+Ox3, NAd  Cardiovascular: rrr  Respiratory: clear anterior  Abdomen: +BS, distended  Musculoskeletal: mild tremors   Data Reviewed: Basic Metabolic Panel:  Recent Labs Lab 06/09/12 0802 06/09/12 1230 06/10/12 0512  NA 140  --  137  K 2.9*  --  3.7  CL 94*  --  97  CO2 27  --  24  GLUCOSE 97  --  88  BUN 4*  --  7  CREATININE 0.66 0.64 0.66  CALCIUM 9.0  --  9.3  MG  --  1.9 2.2  PHOS  --  3.2  --    Liver Function Tests:  Recent Labs Lab 06/09/12 0802 06/10/12 0512  AST  185* 132*  ALT 127* 103*  ALKPHOS 105 102  BILITOT 0.9 1.8*  PROT 7.3 7.1  ALBUMIN 4.4 4.0    Recent Labs Lab 06/09/12 1230  LIPASE 41   No results found for this basename: AMMONIA,  in the last 168 hours CBC:  Recent Labs Lab 06/09/12 0802 06/09/12 1230 06/10/12 0512  WBC 3.5* 4.7 5.6  HGB 13.6 13.8 14.6  HCT 37.9* 38.6* 41.0  MCV 91.3 91.0 91.3  PLT 98* 99* 118*   Cardiac Enzymes:  Recent Labs Lab 06/09/12 1230 06/09/12 1801 06/09/12 2323  TROPONINI <0.30 <0.30 <0.30   BNP (last 3 results)  Recent Labs  12/09/11 1655 01/19/12 1905  PROBNP 110.2 135.3*   CBG: No results found for this basename: GLUCAP,  in the last 168 hours  No results  found for this or any previous visit (from the past 240 hour(s)).   Studies: Dg Chest 1 View  06/09/2012  *RADIOLOGY REPORT*  Clinical Data: Chest pain  CHEST - 1 VIEW  Comparison: 01/19/2012, 03/07/2012  Findings: normal heart size and vascularity.  Minimal left base scarring.  No focal pneumonia, collapse, consolidation, edema, or pneumothorax.  Trachea midline.  No effusion.  Lower cervical fusion hardware noted with broken screws  IMPRESSION: Stable chest exam.  No superimposed acute process   Original Report Authenticated By: Judie Petit. Miles Costain, M.D.    US Abdomen Complete  06/10/2012  *RADIOLOGY REPORT*  Clinical Data: Transaminase.  Ethanol abuse.  ABDOMEN ULTRASOUND  Technique:  Complete abdominal ultrasound examination was performed including evaluation of the liver, gallbladder, bile ducts, pancreas, kidneys, spleen, IVC, and abdominal aorta.  Comparison: CT scan from 01/20/2012.  Findings:  Gallbladder:  There is no evidence for gallstones.  No gallbladder wall thickening or pericholecystic fluid.  The sonographer reports no sonographic Murphy's sign.  Common Bile Duct:  Nondilated at 5 mm diameter.  Liver:  Diffuse coarsening of the hepatic echotexture is consistent with fatty infiltration.  No intrahepatic biliary duct dilatation.  IVC:  Obscured by overlying bowel gas.  Pancreas:  Obscured by overlying bowel gas.  Spleen:  Normal.  Right kidney:  13.4 cm in long axis.  Normal.  Left kidney:  13.1 cm in long axis.  Normal.  Abdominal Aorta:  The visualized portions show no evidence for aneurysm.  IMPRESSION: Fatty changes in the liver.  Otherwise unremarkable.   Original Report Authenticated By: Kennith Center, M.D.     Scheduled Meds: . aspirin EC  325 mg Oral Daily  . carvedilol  12.5 mg Oral BID WC  . fluconazole  100 mg Oral Daily  . folic acid  1 mg Oral Daily  . heparin  5,000 Units Subcutaneous Q8H  . LORazepam  0-4 mg Intravenous Q6H   Followed by  . [START ON 06/11/2012] LORazepam  0-4 mg  Intravenous Q12H  . losartan  50 mg Oral BID  . methadone  20 mg Oral Daily  . multivitamin with minerals  1 tablet Oral Daily  . nicotine  14 mg Transdermal Daily  . sodium chloride  3 mL Intravenous Q12H  . thiamine  100 mg Oral Daily   Or  . thiamine  100 mg Intravenous Daily   Continuous Infusions: . sodium chloride 50 mL/hr at 06/09/12 1438    Active Problems:   Alcoholic hepatitis   Acute alcoholic intoxication   H/O ETOH abuse   Cardiomyopathy, dilated, nonischemic   Transaminitis   Hypokalemia    Time spent: 25  Marlin Canary  [facility] Pager [phone]. If 7PM-7AM, please contact night-coverage at [email or web address], password [facility] 06/10/2012, 9:49 AM  LOS: 1 day

## 2012-06-11 ENCOUNTER — Inpatient Hospital Stay (HOSPITAL_COMMUNITY): Payer: MEDICAID

## 2012-06-11 ENCOUNTER — Other Ambulatory Visit: Payer: Self-pay

## 2012-06-11 DIAGNOSIS — B370 Candidal stomatitis: Secondary | ICD-10-CM

## 2012-06-11 DIAGNOSIS — G9340 Encephalopathy, unspecified: Secondary | ICD-10-CM

## 2012-06-11 DIAGNOSIS — I10 Essential (primary) hypertension: Secondary | ICD-10-CM

## 2012-06-11 LAB — CBC
HCT: 42.5 % (ref 39.0–52.0)
MCH: 32.3 pg (ref 26.0–34.0)
MCHC: 35.5 g/dL (ref 30.0–36.0)
MCV: 91 fL (ref 78.0–100.0)
Platelets: 115 10*3/uL — ABNORMAL LOW (ref 150–400)
RDW: 14.5 % (ref 11.5–15.5)
WBC: 6.2 10*3/uL (ref 4.0–10.5)

## 2012-06-11 LAB — URINALYSIS, ROUTINE W REFLEX MICROSCOPIC
Glucose, UA: NEGATIVE mg/dL
Hgb urine dipstick: NEGATIVE
Leukocytes, UA: NEGATIVE
Specific Gravity, Urine: 1.018 (ref 1.005–1.030)

## 2012-06-11 LAB — POCT I-STAT 3, ART BLOOD GAS (G3+)
Bicarbonate: 21.1 mEq/L (ref 20.0–24.0)
TCO2: 22 mmol/L (ref 0–100)
pCO2 arterial: 32.4 mmHg — ABNORMAL LOW (ref 35.0–45.0)
pH, Arterial: 7.421 (ref 7.350–7.450)
pO2, Arterial: 311 mmHg — ABNORMAL HIGH (ref 80.0–100.0)

## 2012-06-11 LAB — MAGNESIUM: Magnesium: 2.1 mg/dL (ref 1.5–2.5)

## 2012-06-11 LAB — COMPREHENSIVE METABOLIC PANEL
ALT: 82 U/L — ABNORMAL HIGH (ref 0–53)
AST: 88 U/L — ABNORMAL HIGH (ref 0–37)
Albumin: 4 g/dL (ref 3.5–5.2)
Alkaline Phosphatase: 100 U/L (ref 39–117)
BUN: 13 mg/dL (ref 6–23)
Chloride: 99 mEq/L (ref 96–112)
Potassium: 3.5 mEq/L (ref 3.5–5.1)
Sodium: 137 mEq/L (ref 135–145)
Total Bilirubin: 1.4 mg/dL — ABNORMAL HIGH (ref 0.3–1.2)
Total Protein: 7 g/dL (ref 6.0–8.3)

## 2012-06-11 LAB — BASIC METABOLIC PANEL
Calcium: 8.8 mg/dL (ref 8.4–10.5)
GFR calc Af Amer: >90 mL/min (ref >90)
GFR calc non Af Amer: >90 mL/min (ref >90)
Potassium: 3.2 mEq/L — ABNORMAL LOW (ref 3.5–5.1)
Sodium: 134 mEq/L — ABNORMAL LOW (ref 135–145)

## 2012-06-11 LAB — GLUCOSE, CAPILLARY
Glucose-Capillary: 126 mg/dL — ABNORMAL HIGH (ref 70–99)
Glucose-Capillary: 125 mg/dL — ABNORMAL HIGH (ref 70–99)

## 2012-06-11 LAB — TROPONIN I: Troponin I: <0.3 ng/mL (ref <0.30)

## 2012-06-11 LAB — PHOSPHORUS: Phosphorus: 3.8 mg/dL (ref 2.3–4.6)

## 2012-06-11 MED ORDER — HALOPERIDOL LACTATE 5 MG/ML IJ SOLN
5.0000 mg | Freq: Once | INTRAMUSCULAR | Status: AC
  Administered 2012-06-11: 5 mg via INTRAVENOUS

## 2012-06-11 MED ORDER — NOREPINEPHRINE BITARTRATE 1 MG/ML IJ SOLN
2.0000 ug/min | INTRAVENOUS | Status: DC
  Filled 2012-06-11: qty 4

## 2012-06-11 MED ORDER — VECURONIUM BROMIDE 10 MG IV SOLR
INTRAVENOUS | Status: AC
  Administered 2012-06-11: 10 mg
  Filled 2012-06-11: qty 10

## 2012-06-11 MED ORDER — POTASSIUM CHLORIDE 20 MEQ/15ML (10%) PO LIQD
40.0000 meq | Freq: Once | ORAL | Status: AC
  Administered 2012-06-11: 40 meq
  Filled 2012-06-11: qty 30

## 2012-06-11 MED ORDER — LORAZEPAM 2 MG/ML IJ SOLN
INTRAMUSCULAR | Status: AC
  Administered 2012-06-11: 4 mg via INTRAVENOUS
  Filled 2012-06-11: qty 2

## 2012-06-11 MED ORDER — SODIUM CHLORIDE 0.9 % IV SOLN
1.0000 mg/h | INTRAVENOUS | Status: DC
  Administered 2012-06-11 (×2): 10 mg/h via INTRAVENOUS
  Administered 2012-06-11: 2 mg/h via INTRAVENOUS
  Administered 2012-06-11: 4 mg/h via INTRAVENOUS
  Administered 2012-06-12: 10 mg/h via INTRAVENOUS
  Filled 2012-06-11 (×7): qty 10

## 2012-06-11 MED ORDER — LORAZEPAM 2 MG/ML IJ SOLN
4.0000 mg | Freq: Once | INTRAMUSCULAR | Status: AC
  Administered 2012-06-11: 4 mg via INTRAVENOUS

## 2012-06-11 MED ORDER — NOREPINEPHRINE BITARTRATE 1 MG/ML IJ SOLN
2.0000 ug/min | INTRAVENOUS | Status: DC
  Administered 2012-06-11: 8 ug/min via INTRAVENOUS
  Administered 2012-06-12: 7 ug/min via INTRAVENOUS
  Filled 2012-06-11: qty 4

## 2012-06-11 MED ORDER — BIOTENE DRY MOUTH MT LIQD
15.0000 mL | Freq: Four times a day (QID) | OROMUCOSAL | Status: DC
  Administered 2012-06-11 – 2012-06-25 (×53): 15 mL via OROMUCOSAL

## 2012-06-11 MED ORDER — CHLORHEXIDINE GLUCONATE 0.12 % MT SOLN
15.0000 mL | Freq: Two times a day (BID) | OROMUCOSAL | Status: DC
  Administered 2012-06-11 – 2012-06-25 (×25): 15 mL via OROMUCOSAL
  Filled 2012-06-11 (×27): qty 15

## 2012-06-11 MED ORDER — DOPAMINE-DEXTROSE 3.2-5 MG/ML-% IV SOLN
2.0000 ug/kg/min | INTRAVENOUS | Status: DC
  Filled 2012-06-11: qty 250

## 2012-06-11 MED ORDER — SODIUM CHLORIDE 0.9 % IV BOLUS (SEPSIS)
500.0000 mL | Freq: Once | INTRAVENOUS | Status: AC
  Administered 2012-06-11: 500 mL via INTRAVENOUS

## 2012-06-11 MED ORDER — PROPOFOL 10 MG/ML IV EMUL
5.0000 ug/kg/min | INTRAVENOUS | Status: DC
  Administered 2012-06-11: 30 ug/kg/min via INTRAVENOUS
  Administered 2012-06-11: 20 ug/kg/min via INTRAVENOUS
  Administered 2012-06-11: 30 ug/kg/min via INTRAVENOUS
  Administered 2012-06-11: 20 ug/kg/min via INTRAVENOUS
  Administered 2012-06-12 (×3): 35 ug/kg/min via INTRAVENOUS
  Administered 2012-06-12 (×2): 50 ug/kg/min via INTRAVENOUS
  Filled 2012-06-11 (×6): qty 100

## 2012-06-11 MED ORDER — SODIUM CHLORIDE 0.9 % IV SOLN
INTRAVENOUS | Status: DC
  Administered 2012-06-11: 20 mL/h via INTRAVENOUS
  Administered 2012-06-11: 100 mL/h via INTRAVENOUS
  Administered 2012-06-14 – 2012-06-16 (×3): via INTRAVENOUS

## 2012-06-11 MED ORDER — DEXMEDETOMIDINE HCL IN NACL 400 MCG/100ML IV SOLN
0.4000 ug/kg/h | INTRAVENOUS | Status: DC
  Administered 2012-06-11: 0.6 ug/kg/h via INTRAVENOUS

## 2012-06-11 MED ORDER — PROPOFOL 10 MG/ML IV EMUL
INTRAVENOUS | Status: AC
  Administered 2012-06-11: 50 ug/min
  Administered 2012-06-11: 100 mg
  Filled 2012-06-11: qty 100

## 2012-06-11 MED ORDER — LORAZEPAM 2 MG/ML IJ SOLN: 4.0000 mg | Freq: Once | INTRAMUSCULAR | Status: AC

## 2012-06-11 MED ORDER — LORAZEPAM 2 MG/ML IJ SOLN
2.0000 mg | Freq: Four times a day (QID) | INTRAMUSCULAR | Status: DC
  Administered 2012-06-11: 4 mg via INTRAVENOUS
  Administered 2012-06-11: 2 mg via INTRAVENOUS
  Filled 2012-06-11: qty 2

## 2012-06-11 MED ORDER — HALOPERIDOL LACTATE 5 MG/ML IJ SOLN: 5.0000 mg | Freq: Four times a day (QID) | INTRAMUSCULAR | Status: DC | PRN

## 2012-06-11 MED ORDER — FENTANYL CITRATE 0.05 MG/ML IJ SOLN
INTRAMUSCULAR | Status: AC
  Administered 2012-06-11: 100 ug
  Filled 2012-06-11: qty 4

## 2012-06-11 MED ORDER — DEXMEDETOMIDINE HCL IN NACL 200 MCG/50ML IV SOLN
INTRAVENOUS | Status: AC
  Filled 2012-06-11: qty 50

## 2012-06-11 MED ORDER — PHENYLEPHRINE HCL 10 MG/ML IJ SOLN
30.0000 ug/min | INTRAMUSCULAR | Status: DC
  Filled 2012-06-11: qty 1

## 2012-06-11 MED ORDER — DEXTROSE 5 % IV SOLN
2.0000 ug/min | INTRAVENOUS | Status: DC
  Administered 2012-06-11: 8 ug/min via INTRAVENOUS

## 2012-06-11 MED ORDER — MAGNESIUM SULFATE 40 MG/ML IJ SOLN
2.0000 g | Freq: Once | INTRAMUSCULAR | Status: AC
  Administered 2012-06-11: 2 g via INTRAVENOUS
  Filled 2012-06-11: qty 50

## 2012-06-11 MED ORDER — MIDAZOLAM HCL 2 MG/2ML IJ SOLN
INTRAMUSCULAR | Status: AC
  Administered 2012-06-11: 2 mg
  Filled 2012-06-11: qty 4

## 2012-06-11 MED ORDER — POTASSIUM CHLORIDE 20 MEQ/15ML (10%) PO LIQD
ORAL | Status: AC
  Filled 2012-06-11: qty 30

## 2012-06-11 MED ORDER — DEXMEDETOMIDINE HCL IN NACL 200 MCG/50ML IV SOLN
0.4000 ug/kg/h | INTRAVENOUS | Status: DC
  Administered 2012-06-11: 0.4 ug/kg/h via INTRAVENOUS
  Filled 2012-06-11: qty 50

## 2012-06-11 MED ORDER — DEXMEDETOMIDINE HCL IN NACL 200 MCG/50ML IV SOLN: 0.4000 ug/kg/h | INTRAVENOUS | Status: DC

## 2012-06-11 NOTE — Progress Notes (Signed)
Report called to 2600 

## 2012-06-11 NOTE — Procedures (Signed)
Central Venous Catheter Insertion Procedure Note Mark [name] [id number] Mar 07, 1966  Procedure: Insertion of Central Venous Catheter Indications: Assessment of intravascular volume, Drug and/or fluid administration and Frequent blood sampling  Procedure Details Consent: emergent, shock Time Out: Verified patient identification, verified procedure, site/side was marked, verified correct patient position, special equipment/implants available, medications/allergies/relevent history reviewed, required imaging and test results available.  Performed  Maximum sterile technique was used including antiseptics, cap, gloves, gown, hand hygiene, mask and sheet. Skin prep: Chlorhexidine; local anesthetic administered A antimicrobial bonded/coated triple lumen catheter was placed in the left internal jugular vein using the Seldinger technique.  Evaluation Blood flow good Complications: No apparent complications Patient did tolerate procedure well. Chest X-ray ordered to verify placement.  CXR: pending.  [name] 06/11/2012, 3:45 PM  [place] Dr Anselm Jungling with  [name]. Tyson Alias, MD, FACP Pgr: [phone] [facility]

## 2012-06-11 NOTE — Progress Notes (Signed)
Visited with pt. Pt appeared confused and wanted to leave his room. Nursing staff were called to assist pt back to his room. Pt returned to his room.   Sherol Dade Counselor Intern Haroldine Laws

## 2012-06-11 NOTE — Progress Notes (Signed)
Pt again threatening to leave, MD notified. Security at door. Attempt x1 to call report, RN unavailable. Pt attempting to put on soiled utility bag as pants, hospital gown as pants, responding to stimuli that isn't there- wanting to "plant those trees." etc. 1mg  of PO ativan given, pt has removed IV, refusing restart. Will continue to monitor.

## 2012-06-11 NOTE — Consult Note (Signed)
PULMONARY  / CRITICAL CARE MEDICINE  Name: Mark [name] MRN: [id number] DOB: 1965-12-09    ADMISSION DATE:  06/09/2012 CONSULTATION DATE:  06/11/12  REFERRING MD :  VANN/TRH  CHIEF COMPLAINT:  ETOH withdraw   BRIEF PATIENT DESCRIPTION: 47 y/o with history of ETOH abuse, HTN, dilated cardiomyopathy, and anxiety admitted from the ED on 4/1 with headache x 1 week, diffuse abdominal pain x 5 days and chronic worsening chest pain.  [name] were elevated but have been trending down.  Abd [place] was negative.  Cardiac biomarkers negative.  Last drink on 4/1 12AM.  Patient has been on CIWA protocol since admission but has become increasingly confused and combative despite treatment.  The patient was not able to be managed on the floor and has been admitted to the MICU for management of withdraw.        SIGNIFICANT EVENTS / STUDIES:  4/1 Echo - Mild LVH. Systolic function normal. The estimated EF 55-60%.  LINES / TUBES:   CULTURES:   ANTIBIOTICS:  4/1 Diflucan 4/3  HISTORY OF PRESENT ILLNESS:  47 y/o with history of ETOH abuse, HTN, dilated cardiomyopathy, and anxiety admitted from the ED on 4/1 with headache x 1 week, diffuse abdominal pain x 5 days and chronic worsening chest pain.  [name] were elevated but have been trending down.  Abd [place] was negative.  Cardiac biomarkers negative.  Last drink on 4/1 12AM.  Patient has been on CIWA protocol since admission but has become increasingly confused and combative despite treatment.  The patient was not able to be managed on the floor and has been admitted to the MICU for management of withdraw.       Was admitted for chest pain in 02/2012, negative for cardiac cause,followed by Dr. Sharyn [name].   PAST MEDICAL HISTORY :  Past Medical History  Diagnosis Date  . Hypertension   . Hyperlipidemia   . 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 ETOH abuse   . GERD (gastroesophageal reflux disease)   . Mental disorder    . Irregular heart beat   . Anxiety   . Shortness of breath   . Headache   . Hepatitis    Past Surgical History  Procedure Laterality Date  . Back surgery    . Neck surgery    . Fundoplasty transthoracic  2003  . Nasal sinus surgery      Allergies  Allergen Reactions  . Lisinopril Anaphylaxis and Swelling   SOCIAL HISTORY:  reports that he has been smoking Cigarettes.  He has a 60 pack-year smoking history. He has never used smokeless tobacco. He reports that he drinks about 4.2 ounces of alcohol per week. He reports that he uses illicit drugs.  REVIEW OF SYSTEMS:  Unable to obtain, patient refusal   SUBJECTIVE: Agitated, combative  VITAL SIGNS: Temp:  [97.8 F (36.6 C)-98.5 F (36.9 C)] 98.1 F (36.7 C) (04/03 0859) Pulse Rate:  [89-111] 111 (04/03 0859) Resp:  [18-22] 22 (04/03 0859) BP: (117-145)/(70-86) 120/81 mmHg (04/03 0859) SpO2:  [97 %-100 %] 98 % (04/03 0859) Weight:  [96.8 kg (213 lb 6.5 oz)] 96.8 kg (213 lb 6.5 oz) (04/02 2043) HEMODYNAMICS:      INTAKE / OUTPUT: Intake/Output     04/02 0701 - 04/03 0700 04/03 0701 - 04/04 0700   P.O. 1680    Total Intake(mL/kg) 1680 (17.4)    Urine (mL/kg/hr) 200 (0.1)  Total Output 200     Net +1480          Urine Occurrence 3 x    Stool Occurrence 2 x      PHYSICAL EXAMINATION: General: Alternating resting and combative Neuro:  Oriented X 2-3, having hallucinations.  No focal deficits. HEENT:  Pupils PERRL, tongue with white coating, no JVD  Cardiovascular: S1S2 audible, no mrg Lungs:  Clear in all fields  Abdomen: + bowel sounds, slightly distended with mild pain on palpation throughout  Musculoskeletal: Intact Skin:  Intact  LABS:  Recent Labs Lab 06/09/12 0802 06/09/12 1230 06/10/12 0512 06/11/12 0543  NA 140  --  137 137  K 2.9*  --  3.7 3.5  CL 94*  --  97 99  CO2 27  --  24 23  BUN 4*  --  7 13  CREATININE 0.66 0.64 0.66 0.71  GLUCOSE 97  --  88 111*    Recent Labs Lab  06/09/12 1230 06/10/12 0512 06/11/12 0543  HGB 13.8 14.6 15.1  HCT 38.6* 41.0 42.5  WBC 4.7 5.6 6.2  PLT 99* 118* 115*     Recent Labs Lab 06/09/12 1230 06/09/12 1801 06/09/12 2323  TROPONINI <0.30 <0.30 <0.30    No results found for this basename: GLUCAP,  in the last 168 hours  CXR: 4/1 - Clear with no acute process   ASSESSMENT / PLAN:  NEUROLOGIC A:  Acute encephalopathy due to EtOH w/d syndrome  history of w/d include seizure P:   -CIWA -Precedex gtt   CARDIOVASCULAR A:  History of dilated cardiomyopathy- Likely secondary to alcohol abuse- Chest pain resolved, biomarkers negative, 4-1 Echo showed EF 55-60% Hypertension  P:  -Cont Asprin  -Cont Coreg   RENAL A:   Hypokalemia secondary to alcohol abuse - stable P:   -AM BMP -Supp electrolytes PRN  GASTROINTESTINAL A:  Abdominal pain - Elevated LFTs - Likely secondary to alcoholic hepatic disease, LFTs improving, Abd [place] negative P:   -F/u LFTs   INFECTIOUS A:   Risk for HIV - opioid use, possible IV Oral Thrush - appears to be resolved P:   -D/c Diflucan -HIV serology ordered for AM 4/4   PULMONARY A:  No acute issue P:   -monitor   HEMATOLOGIC A:  No acute issue P:  -monitor   ENDOCRINE A: No acute issue P:   -Supportive care    TODAY'S SUMMARY: Patient currently in alcohol withdraw and not able to managed on the floor.  IV access obtained, scheduled ativan, and started Precedex gtt.    Kate Sable NP-Student  06/11/2012, 10:53 AM   I have interviewed and examined the patient and reviewed the database. I have formulated the assessment and plan as reflected in the note above with amendments made by me.    Billy Fischer, MD;  PCCM service; Mobile [phone]

## 2012-06-11 NOTE — Progress Notes (Signed)
1645 EKG done, critical prolonged QTC reported to Dr.  Tyson Alias and Anders Simmonds

## 2012-06-11 NOTE — Progress Notes (Signed)
Upon arrival to unit pt found on hands and knees in bed, states is upset of just seeing his dog hit by a car. Pt with visible tremors at rest. CIWA 29. 4 mg of IV ativan given and MD notified of pt condition. MD to unit to see pt, agreeable to send pt to stepdown. Pt intermittently oriented,however is talking to people that are not there and laughing inappropriately. 4mg  of ativan given, pt on bed alarm. Will continue to monitor while waiting for bed.

## 2012-06-11 NOTE — Progress Notes (Addendum)
Pt has pulled his telemetry monitor off and IV out. Pt states he has to go. His dog has been hit by a car. And he wants a cigarette. Pt is alert to place and person. Disoriented to time and situation.  Pt declining to sit down. Pt with current CIWA = 31. Pt with no available dose of ativan at this time. Text page to Donnamarie Poag, NP with call back. New order for ativan 4 mg now and monitor. Admin ordered dose, Pt returned to bed with bed alarm on, and bed in lowest position. Will continue monitoring. Dondra Spry

## 2012-06-11 NOTE — Progress Notes (Signed)
[facility] PROGRESS NOTE  Mark [name] ZOX:[id number] DOB: [birth year] DOA: 06/09/2012 PCP: Lenora Boys, MD  Assessment/Plan: 1. Acute alcoholic intoxication/H/O ETOH abuse: Patient has a known history of chronic alcohol abuse, and has had DTs in the past. He drinks a fifth of liquor daily,  last drink was at 12:00 AM 06/09/12. ETOH level was 182 on presentation, consistent with acute alcoholic intoxication.  On 4/3 AM he had severe withdrawal signs with elevated CIWA- transferred to step down and asked critical care to help with management of precidex gtt -patient confused, slightly combative    2. Abdominal pain/Transaminitis: On admission, patient was noted to have abnormal LFTs, AST 185, ALT 127, Alk Phos 105 and T. Bil 0.9. Per EMR, LFTs were normal on 02/15/13. Suspect the etiology is alcoholic liver disease. Patient has abdominal pain, has had a few days of diarrhea. Abdomen appears mildly distended and there is a suspicion of shifting dullness.  check viral hepatitis serology,  abdominal U/S ok. improving LFTs.   3. Hypokalemia: This is likely due to ETOH abuse. repleted  4. Tobacco abuse: Patient smokes a half pack of cigarettes per day. Have counseled appropriately. Will place on Nicoderm CQ patch.   5. Chest pain/Cardiomyopathy, dilated, nonischemic: Patient has known non-ischemic/Dilated cardiomyopathy, EF 25% on 01/21/2012, under care of Dr Rinaldo Cloud, whom he says that he saw about 2 weeks ago. He is s/p negative ETT 01/20/13. Dr Sharyn Lull was planning outpatient 2D echocardiogram, but this has not yet been done. Clinically, CHF does not appear to be decompensated. Will arrange 2D echocardiogram and CXR. Will need to pay close attention to volume status. Cardiac enzymes will be cycled, in view of atypical chest pain. Add protonix- ?alcoholic gastritis  6. HTN: This Appears reasonably controlled. Will continue pre-admission antihypertensives.   7. Oral thrush: For a 7-day  course of Diflucan. HIV pending   Code Status: full Family Communication: patient at bedside Disposition Plan: home 1-2 days   Consultants:  none  Procedures:  echo  Antibiotics: None  HPI/Subjective: Confused, combative Pain in stomach is better  Objective: Filed Vitals:   06/11/12 0415 06/11/12 0859 06/11/12 1145 06/11/12 1200  BP: 117/85 120/81    Pulse: 94 111    Temp: 98.5 F (36.9 C) 98.1 F (36.7 C) 98.1 F (36.7 C)   TempSrc: Oral Oral Oral   Resp: 18 22    Height:    6\' 4"  (1.93 m)  Weight:    92 kg (202 lb 13.2 oz)  SpO2: 98% 98%      Intake/Output Summary (Last 24 hours) at 06/11/12 1255 Last data filed at 06/11/12 1200  Gross per 24 hour  Intake  729.2 ml  Output      0 ml  Net  729.2 ml   Filed Weights   06/09/12 2026 06/10/12 2043 06/11/12 1200  Weight: 95.312 kg (210 lb 2 oz) 96.8 kg (213 lb 6.5 oz) 92 kg (202 lb 13.2 oz)    Exam:   General:  Confused, tremulous  Cardiovascular: rrr  Respiratory: clear anterior  Abdomen: +BS, distended  Musculoskeletal: ataxic gait  Data Reviewed: Basic Metabolic Panel:  Recent Labs Lab 06/09/12 0802 06/09/12 1230 06/10/12 0512 06/11/12 0543  NA 140  --  137 137  K 2.9*  --  3.7 3.5  CL 94*  --  97 99  CO2 27  --  24 23  GLUCOSE 97  --  88 111*  BUN 4*  --  7  13  CREATININE 0.66 0.64 0.66 0.71  CALCIUM 9.0  --  9.3 9.6  MG  --  1.9 2.2  --   PHOS  --  3.2  --   --    Liver Function Tests:  Recent Labs Lab 06/09/12 0802 06/10/12 0512 06/11/12 0543  AST 185* 132* 88*  ALT 127* 103* 82*  ALKPHOS 105 102 100  BILITOT 0.9 1.8* 1.4*  PROT 7.3 7.1 7.0  ALBUMIN 4.4 4.0 4.0    Recent Labs Lab 06/09/12 1230  LIPASE 41   No results found for this basename: AMMONIA,  in the last 168 hours CBC:  Recent Labs Lab 06/09/12 0802 06/09/12 1230 06/10/12 0512 06/11/12 0543  WBC 3.5* 4.7 5.6 6.2  HGB 13.6 13.8 14.6 15.1  HCT 37.9* 38.6* 41.0 42.5  MCV 91.3 91.0 91.3 91.0   PLT 98* 99* 118* 115*   Cardiac Enzymes:  Recent Labs Lab 06/09/12 1230 06/09/12 1801 06/09/12 2323  TROPONINI <0.30 <0.30 <0.30   BNP (last 3 results)  Recent Labs  12/09/11 1655 01/19/12 1905  PROBNP 110.2 135.3*   CBG: No results found for this basename: GLUCAP,  in the last 168 hours  No results found for this or any previous visit (from the past 240 hour(s)).   Studies: US Abdomen Complete  06/10/2012  *RADIOLOGY REPORT*  Clinical Data: Transaminase.  Ethanol abuse.  ABDOMEN ULTRASOUND  Technique:  Complete abdominal ultrasound examination was performed including evaluation of the liver, gallbladder, bile ducts, pancreas, kidneys, spleen, IVC, and abdominal aorta.  Comparison: CT scan from 01/20/2012.  Findings:  Gallbladder:  There is no evidence for gallstones.  No gallbladder wall thickening or pericholecystic fluid.  The sonographer reports no sonographic Murphy's sign.  Common Bile Duct:  Nondilated at 5 mm diameter.  Liver:  Diffuse coarsening of the hepatic echotexture is consistent with fatty infiltration.  No intrahepatic biliary duct dilatation.  IVC:  Obscured by overlying bowel gas.  Pancreas:  Obscured by overlying bowel gas.  Spleen:  Normal.  Right kidney:  13.4 cm in long axis.  Normal.  Left kidney:  13.1 cm in long axis.  Normal.  Abdominal Aorta:  The visualized portions show no evidence for aneurysm.  IMPRESSION: Fatty changes in the liver.  Otherwise unremarkable.   Original Report Authenticated By: Kennith Center, M.D.     Scheduled Meds: . aspirin EC  325 mg Oral Daily  . carvedilol  12.5 mg Oral BID WC  . folic acid  1 mg Oral Daily  . heparin  5,000 Units Subcutaneous Q8H  . LORazepam  2 mg Intravenous Q6H  . losartan  50 mg Oral BID  . methadone  20 mg Oral Daily  . multivitamin with minerals  1 tablet Oral Daily  . nicotine  14 mg Transdermal Daily  . pantoprazole  40 mg Oral BID  . sodium chloride  3 mL Intravenous Q12H  . thiamine  100 mg Oral  Daily   Or  . thiamine  100 mg Intravenous Daily   Continuous Infusions: . dexmedetomidine      Active Problems:   Alcoholic hepatitis   Acute alcoholic intoxication   H/O ETOH abuse   Cardiomyopathy, dilated, nonischemic   Transaminitis   Hypokalemia    Time spent: 35    [facility], Gabriele Loveland  [facility] Pager [phone]. If 7PM-7AM, please contact night-coverage at [email or web address], password [facility] 06/11/2012, 12:55 PM  LOS: 2 days

## 2012-06-11 NOTE — Procedures (Signed)
Intubation Procedure Note Mark [name] [id number] 1965/11/04  Procedure: Intubation Indications: Airway protection and maintenance , agonal after sedation for severe aggitation  Procedure Details Consent: Unable to obtain consent because of altered level of consciousness. Time Out: Verified patient identification, verified procedure, site/side was marked, verified correct patient position, special equipment/implants available, medications/allergies/relevent history reviewed, required imaging and test results available.  Performed  Maximum sterile technique was used including cap, gloves, hand hygiene and mask.  4 glide scope     Evaluation Hemodynamic Status: BP stable throughout; O2 sats: stable throughout Patient's Current Condition: stable Complications: No apparent complications Patient did tolerate procedure well. Chest X-ray ordered to verify placement.  CXR: pending.   Mark [name] 06/11/2012  Performed  Mark [name]. Mark Alias, MD, FACP Pgr: [phone] [facility]   glidescope

## 2012-06-11 NOTE — Progress Notes (Signed)
Hypokalemia   K replaced  

## 2012-06-11 NOTE — Progress Notes (Signed)
Report called to 2100, attempted PIV, unsuccessful d/t pt movement. Pt refusing reattempt. IV team paged. Pt to transport to unit.

## 2012-06-11 NOTE — Progress Notes (Signed)
Family requests case manager to see pt and set up meeting with family

## 2012-06-11 NOTE — Progress Notes (Signed)
Pt agitated, wanting to go to corner store to smoke. Explained that pt cannot leave, he is sick and in hospital. Pt consolable at this time. Will continue to monitor.

## 2012-06-12 LAB — BASIC METABOLIC PANEL
BUN: 11 mg/dL (ref 6–23)
Chloride: 103 mEq/L (ref 96–112)
Creatinine, Ser: 0.74 mg/dL (ref 0.50–1.35)
GFR calc Af Amer: >90 mL/min (ref >90)
GFR calc non Af Amer: >90 mL/min (ref >90)
Glucose, Bld: 101 mg/dL — ABNORMAL HIGH (ref 70–99)
Potassium: 3.7 mEq/L (ref 3.5–5.1)

## 2012-06-12 LAB — GLUCOSE, CAPILLARY: Glucose-Capillary: 101 mg/dL — ABNORMAL HIGH (ref 70–99)

## 2012-06-12 MED ORDER — LORAZEPAM BOLUS VIA INFUSION
1.0000 mg | INTRAVENOUS | Status: DC | PRN
  Administered 2012-06-12 (×2): 1 mg via INTRAVENOUS
  Filled 2012-06-12: qty 1

## 2012-06-12 MED ORDER — PANTOPRAZOLE SODIUM 40 MG IV SOLR
40.0000 mg | Freq: Two times a day (BID) | INTRAVENOUS | Status: DC
  Administered 2012-06-12 – 2012-06-19 (×16): 40 mg via INTRAVENOUS
  Filled 2012-06-12 (×19): qty 40

## 2012-06-12 MED ORDER — LORAZEPAM 2 MG/ML IJ SOLN
2.0000 mg | INTRAMUSCULAR | Status: DC | PRN
  Administered 2012-06-12 – 2012-06-18 (×2): 2 mg via INTRAVENOUS
  Filled 2012-06-12 (×3): qty 1

## 2012-06-12 MED ORDER — MORPHINE SULFATE 4 MG/ML IJ SOLN
4.0000 mg | INTRAMUSCULAR | Status: DC | PRN
  Administered 2012-06-12 – 2012-06-24 (×10): 4 mg via INTRAVENOUS
  Filled 2012-06-12 (×9): qty 1

## 2012-06-12 MED ORDER — LORAZEPAM BOLUS VIA INFUSION
5.0000 mg | Freq: Once | INTRAVENOUS | Status: AC
  Administered 2012-06-12: 5 mg via INTRAVENOUS
  Filled 2012-06-12: qty 5

## 2012-06-12 MED ORDER — DEXMEDETOMIDINE HCL IN NACL 200 MCG/50ML IV SOLN
0.2000 ug/kg/h | INTRAVENOUS | Status: DC
  Administered 2012-06-12 (×3): 1.1 ug/kg/h via INTRAVENOUS
  Administered 2012-06-12: 0.4 ug/kg/h via INTRAVENOUS
  Administered 2012-06-13: 0.7 ug/kg/h via INTRAVENOUS
  Administered 2012-06-13: 1 ug/kg/h via INTRAVENOUS
  Administered 2012-06-13: 1.1 ug/kg/h via INTRAVENOUS
  Administered 2012-06-13: 1 ug/kg/h via INTRAVENOUS
  Administered 2012-06-13: 0.7 ug/kg/h via INTRAVENOUS
  Administered 2012-06-13: 0.9 ug/kg/h via INTRAVENOUS
  Administered 2012-06-13: 1.1 ug/kg/h via INTRAVENOUS
  Administered 2012-06-13: 1 ug/kg/h via INTRAVENOUS
  Administered 2012-06-14 (×2): 1.1 ug/kg/h via INTRAVENOUS
  Filled 2012-06-12: qty 50
  Filled 2012-06-12 (×2): qty 200
  Filled 2012-06-12 (×6): qty 50

## 2012-06-12 MED ORDER — LORAZEPAM 2 MG/ML IJ SOLN
1.0000 mg/h | INTRAVENOUS | Status: DC
  Administered 2012-06-12: 14 mg/h via INTRAVENOUS
  Administered 2012-06-12: 18 mg/h via INTRAVENOUS
  Administered 2012-06-12: 20 mg/h via INTRAVENOUS
  Administered 2012-06-12 – 2012-06-13 (×2): 10 mg/h via INTRAVENOUS
  Administered 2012-06-13: 15 mg/h via INTRAVENOUS
  Administered 2012-06-13: 10 mg/h via INTRAVENOUS
  Administered 2012-06-13: 5 mg/h via INTRAVENOUS
  Administered 2012-06-14: 8 mg/h via INTRAVENOUS
  Administered 2012-06-14: 7 mg/h via INTRAVENOUS
  Administered 2012-06-15: 2 mg/h via INTRAVENOUS
  Administered 2012-06-15: 6 mg/h via INTRAVENOUS
  Administered 2012-06-15 – 2012-06-17 (×2): 2 mg/h via INTRAVENOUS
  Administered 2012-06-19: 6 mg/h via INTRAVENOUS
  Administered 2012-06-19: 2 mg/h via INTRAVENOUS
  Administered 2012-06-20: 4 mg/h via INTRAVENOUS
  Filled 2012-06-12 (×21): qty 25

## 2012-06-12 MED FILL — Lorazepam Inj 2 MG/ML: INTRAMUSCULAR | Qty: 1 | Status: AC

## 2012-06-12 NOTE — Progress Notes (Signed)
Called by the bedside nurse for agitation; the patient currently on 20 mg/h ativan;   Start precedex in addition to ativan and increase morphine to 4 mg q4h.

## 2012-06-12 NOTE — Progress Notes (Signed)
INITIAL NUTRITION ASSESSMENT  DOCUMENTATION CODES Per approved criteria  -Not Applicable   INTERVENTION:  If unable to extubate patient within the next 24 hours, recommend initiate TF via OG tube with Jevity 1.2 at 25 ml/h, increase by 10 ml every 4 hours to goal rate of 35 ml/h with Prostat 60 ml TID to provide 1608 kcals, 137 gm protein, 680 ml free water daily.  TF plus current propofol rate would provide a total of 2337 kcals (102% of estimated needs).  NUTRITION DIAGNOSIS: Inadequate oral intake related to inability to eat as evidenced by NPO status.   Goal: Intake to meet >90% of estimated nutrition needs.  Monitor:  TF initiation, weight trend, labs, vent status.  Reason for Assessment: VDRF  47 y.o. male  Admitting Dx: Acute alcohol intoxication; transferred to MICU to manage ETOH withdrawal  ASSESSMENT: Patient was on the floor until 4/3 AM, when he developed severe withdrawal signs.  Intubated 4/3. Hypokalemia (due to alcohol abuse) is being treated.  Patient is at nutrition risk given history of alcohol abuse.  Per limited physical exam, patient appears to have no muscle or subcutaneous fat depletion.  Per MD note 4/3, patient with mildly distended abdomen, abdominal pain and diarrhea for the past few days. Suspected etiology is alcoholic liver disease.  Nutrition Focused Physical Exam:  Subcutaneous Fat:  Orbital Region: WNL Upper Arm Region: WNL Thoracic and Lumbar Region: NA  Muscle:  Temple Region: WNL Clavicle Bone Region: WNL Clavicle and Acromion Bone Region: WNL Scapular Bone Region: NA Dorsal Hand: NA Patellar Region: WNL Anterior Thigh Region: WNL Posterior Calf Region: WNL  Edema: NA  Patient is currently intubated on ventilator support.  MV: 12.7 Temp:Temp (24hrs), Avg:98.7 F (37.1 C), Min:98.1 F (36.7 C), Max:99.7 F (37.6 C)  Propofol: 27.6 ml/hr providing 729 kcals/day.  Height: Ht Readings from Last 1 Encounters:  06/11/12 6'  4" (1.93 m)    Weight: Wt Readings from Last 1 Encounters:  06/11/12 202 lb 13.2 oz (92 kg)    Ideal Body Weight: 91.8 kg  % Ideal Body Weight: 100%  Wt Readings from Last 10 Encounters:  06/11/12 202 lb 13.2 oz (92 kg)  03/08/12 195 lb 9.5 oz (88.72 kg)  01/24/12 192 lb 3.2 oz (87.181 kg)  11/18/11 200 lb (90.719 kg)  10/15/11 195 lb (88.451 kg)  09/28/11 200 lb (90.719 kg)  03/20/11 205 lb (92.987 kg)  06/19/10 205 lb 6.4 oz (93.169 kg)    Usual Body Weight: 195 lb  % Usual Body Weight: 104%  BMI:  Body mass index is 24.7 kg/(m^2). WNL  Estimated Nutritional Needs: Kcal: 2289 Protein: 125-150 gm Fluid: 2.3-2.5 L  Skin: no problems noted  Diet Order: NPO  EDUCATION NEEDS: -Education not appropriate at this time   Intake/Output Summary (Last 24 hours) at 06/12/12 1359 Last data filed at 06/12/12 1200  Gross per 24 hour  Intake 2260.6 ml  Output   2625 ml  Net -364.4 ml    Last BM: 4/2   Labs:   Recent Labs Lab 06/09/12 0802 06/09/12 1230 06/10/12 0512 06/11/12 0543 06/11/12 1632 06/12/12 0810  NA 140  --  137 137 134* 138  K 2.9*  --  3.7 3.5 3.2* 3.7  CL 94*  --  97 99 100 103  CO2 27  --  24 23 21 24   BUN 4*  --  7 13 17 11   CREATININE 0.66 0.64 0.66 0.71 0.83 0.74  CALCIUM 9.0  --  9.3 9.6 8.8 9.0  MG  --  1.9 2.2  --  2.1  --   PHOS  --  3.2  --   --  3.8  --   GLUCOSE 97  --  88 111* 117* 101*    CBG (last 3)   Recent Labs  06/11/12 1112 06/11/12 1616  GLUCAP 126* 125*    Scheduled Meds: . antiseptic oral rinse  15 mL Mouth Rinse QID  . aspirin EC  325 mg Oral Daily  . carvedilol  12.5 mg Oral BID WC  . chlorhexidine  15 mL Mouth Rinse BID  . folic acid  1 mg Oral Daily  . heparin  5,000 Units Subcutaneous Q8H  . losartan  50 mg Oral BID  . multivitamin with minerals  1 tablet Oral Daily  . nicotine  14 mg Transdermal Daily  . pantoprazole (PROTONIX) IV  40 mg Intravenous Q12H  . sodium chloride  3 mL Intravenous Q12H   . thiamine  100 mg Oral Daily   Or  . thiamine  100 mg Intravenous Daily    Continuous Infusions: . sodium chloride 20 mL/hr (06/11/12 2336)  . midazolam (VERSED) infusion 10 mg/hr (06/12/12 1200)  . norepinephrine (LEVOPHED) Adult infusion Stopped (06/12/12 0858)  . propofol 50 mcg/kg/min (06/12/12 1200)    Past Medical History  Diagnosis Date  . Hypertension   . Hyperlipidemia   . 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 ETOH abuse   . GERD (gastroesophageal reflux disease)   . Mental disorder   . Irregular heart beat   . Anxiety   . Shortness of breath   . Headache   . Hepatitis     Past Surgical History  Procedure Laterality Date  . Back surgery    . Neck surgery    . Fundoplasty transthoracic  2003  . Nasal sinus surgery      Joaquin Courts, RD, LDN, CNSC Pager [phone] After Hours Pager [phone]

## 2012-06-12 NOTE — Progress Notes (Signed)
PULMONARY  / CRITICAL CARE MEDICINE  Name: ZEFERINO MOUNTS MRN: [id number] DOB: 06-08-1965    ADMISSION DATE:  06/09/2012 CONSULTATION DATE:  06/11/12  REFERRING MD :  VANN/TRH  CHIEF COMPLAINT:  ETOH withdraw   BRIEF PATIENT DESCRIPTION: 47 y/o with history of ETOH abuse, HTN, dilated cardiomyopathy, and anxiety admitted from the ED on 4/1 with headache x 1 week, diffuse abdominal pain x 5 days and chronic worsening chest pain.  [name] were elevated but have been trending down.  Abd [place] was negative.  Cardiac biomarkers negative.  Last drink on 4/1 12AM.  Patient has been on CIWA protocol since admission but has become increasingly confused and combative despite treatment.  The patient was not able to be managed on the floor and has been admitted to the MICU for management of withdraw.        SIGNIFICANT EVENTS / STUDIES:  4/1 Echo - Mild LVH. Systolic function normal. The estimated EF 55-60%. 4/3 intubation   LINES / TUBES: 4/3 ETT>>> 4/3 R IJ >>>   CULTURES: 4/3 Urine ctx>>>  ANTIBIOTICS:  4/1 Diflucan 4/3  SUBJECTIVE: Patient was tense and agitated overnight on propofol so was given Ativan 2mg  PRN QTc was prolonged to >633ms, was on Methadone at home.   VITAL SIGNS: Temp:  [98.1 F (36.7 C)-98.7 F (37.1 C)] 98.3 F (36.8 C) (04/04 0351) Pulse Rate:  [60-111] 86 (04/04 0700) Resp:  [12-25] 17 (04/04 0700) BP: (46-159)/(15-137) 127/79 mmHg (04/04 0700) SpO2:  [96 %-100 %] 98 % (04/04 0700) FiO2 (%):  [40 %-100 %] 40 % (04/04 0400) Weight:  [202 lb 13.2 oz (92 kg)] 202 lb 13.2 oz (92 kg) (04/03 1200) HEMODYNAMICS:    Vent Mode:  [-] PRVC FiO2 (%):  [40 %-100 %] 40 % Set Rate:  [16 bmp-20 bmp] 16 bmp Vt Set:  [700 mL] 700 mL PEEP:  [5 cmH20] 5 cmH20 Plateau Pressure:  [14 cmH20-16 cmH20] 15 cmH20 INTAKE / OUTPUT: Intake/Output     04/03 0701 - 04/04 0700 04/04 0701 - 04/05 0700   P.O.     I.V. (mL/kg) 2312.4 (25.1)    IV Piggyback 50    Total Intake(mL/kg)  2362.4 (25.7)    Urine (mL/kg/hr) 1625 (0.7)    Emesis/NG output 350 (0.2)    Total Output 1975     Net +387.4            PHYSICAL EXAMINATION: General: intubated.  RASS -3 Neuro:sedated, RASS -3 HEENT:  ETT Cardiovascular: S1S2- distant heart sounds, no m/g/r Lungs:  Coarse breath sounds, no wheezes  Abdomen: soft, normoactive bowel sounds, no distension  Musculoskeletal: Intact Skin:  Intact  LABS:  Recent Labs Lab 06/10/12 0512 06/11/12 0543 06/11/12 1632  NA 137 137 134*  K 3.7 3.5 3.2*  CL 97 99 100  CO2 24 23 21   BUN 7 13 17   CREATININE 0.66 0.71 0.83  GLUCOSE 88 111* 117*    Recent Labs Lab 06/09/12 1230 06/10/12 0512 06/11/12 0543  HGB 13.8 14.6 15.1  HCT 38.6* 41.0 42.5  WBC 4.7 5.6 6.2  PLT 99* 118* 115*     Recent Labs Lab 06/11/12 1645 06/11/12 2251 06/12/12 0445  TROPONINI <0.30 <0.30 <0.30     Recent Labs Lab 06/11/12 1112 06/11/12 1616  GLUCAP 126* 125*    CXR: 4/3 - Clear, no infiltrate  ASSESSMENT / PLAN:   PULMONARY A: acute resp failure s/p intubation, alcohol withdrawal P:   -Try SBT today  to see if he is able to tolerate, cpap 5 ps 5 goals of 30 mins; doubt MS will allow extubation  CARDIOVASCULAR A:  History of dilated cardiomyopathy- Likely secondary to alcohol abuse- Chest pain resolved, biomarkers negative, 4-1 Echo showed EF 55-60% Prolonged QTc >632ms overnight now 570's, patient had been on Methadone which can cause prolonged QTc and torsade.     P:  -Cont Asprin  -Cont Coreg  -Will hold Methadone -Will continue to monitor QTc >> consider cards eval if persists with lyte correction -Will correct hypokalemia because a low serum potassium concentration enhances the degree of drug-induced inhibition of IKr, increasing the QT interval. -May need IV magnesium sulfate   RENAL A:  Hypokalemia secondary to alcohol abuse - K 3.2- repleted.   P:   -BMP this AM pending  GASTROINTESTINAL A: Abdominal pain -  Elevated LFTs - Likely secondary to alcoholic hepatic disease, LFTs improving, Abd [place] negative except for fatty infiltrate of liver P:   -LFT in AM   INFECTIOUS A:  No active issues.   P:   -HIV: pending  HEMATOLOGIC A:  Plt slightly low at 115K, likely due to alcoholic liver disease.  AST 88, ALT 82, Alk phos 100. Total bil 1.4 P:  -continue to monitor CBC and LFTs  ENDOCRINE A: No active issue P:   -Supportive care  NEUROLOGIC A:  Acute encephalopathy due to EtOH w/d syndrome.  history of w/d include seizure   P:   -start ativan gtt and titrate off the propofol + versed - consider precedex in addition to the ativan  TODAY'S SUMMARY:    Carrolyn Meiers, PGY-3 06/12/2012, 8:01 AM  CC time 40 min  Levy Pupa, MD, PhD 06/12/2012, 2:34 PM [facility] [phone] or if no answer [phone]

## 2012-06-12 NOTE — Progress Notes (Signed)
Pt noted to continue biting on ETT. Bite block placed but pt is able to expel intervention.  Sats 91-93% on current settings and pt's noncompliance with intubation.  Will continue to monitor.  Rockey Guarino, [facility]

## 2012-06-13 DIAGNOSIS — J9600 Acute respiratory failure, unspecified whether with hypoxia or hypercapnia: Secondary | ICD-10-CM | POA: Diagnosis present

## 2012-06-13 LAB — COMPREHENSIVE METABOLIC PANEL
AST: 49 U/L — ABNORMAL HIGH (ref 0–37)
Albumin: 3.4 g/dL — ABNORMAL LOW (ref 3.5–5.2)
Alkaline Phosphatase: 98 U/L (ref 39–117)
Chloride: 105 mEq/L (ref 96–112)
Potassium: 3.4 mEq/L — ABNORMAL LOW (ref 3.5–5.1)
Sodium: 140 mEq/L (ref 135–145)
Total Bilirubin: 1.2 mg/dL (ref 0.3–1.2)

## 2012-06-13 LAB — GLUCOSE, CAPILLARY
Glucose-Capillary: 133 mg/dL — ABNORMAL HIGH (ref 70–99)
Glucose-Capillary: 160 mg/dL — ABNORMAL HIGH (ref 70–99)

## 2012-06-13 LAB — URINE CULTURE
Colony Count: NO GROWTH
Culture: NO GROWTH

## 2012-06-13 MED ORDER — POTASSIUM CHLORIDE 10 MEQ/50ML IV SOLN
10.0000 meq | INTRAVENOUS | Status: AC
  Administered 2012-06-13 (×2): 10 meq via INTRAVENOUS
  Filled 2012-06-13 (×2): qty 50

## 2012-06-13 NOTE — Progress Notes (Signed)
Paged resident MD to inform him that upon assessment, patient's abdomen was more taut and distended than earlier.

## 2012-06-13 NOTE — Progress Notes (Signed)
CRITICAL VALUE ALERT  Critical value received:  CO2 40  Date of notification:  06/13/2012  Time of notification:  0513  Critical value read back:yes  Nurse who received alert:  Lvarner RN   MD notified (1st page):  MD notified of critical CO2 level x 10 minutes ago- has been addressed  Time of first page:  0430  MD notified (2nd page):  Time of second page:  Responding MD:  Allena Katz MD  Time MD responded:  [phone]

## 2012-06-13 NOTE — Progress Notes (Signed)
PULMONARY  / CRITICAL CARE MEDICINE  Name: Mark [name] MRN: [id number] DOB: [birth year]    ADMISSION DATE:  06/09/2012 CONSULTATION DATE:  06/11/12  REFERRING MD :  VANN/TRH  CHIEF COMPLAINT:  ETOH withdraw   BRIEF PATIENT DESCRIPTION: 47 y/o with history of ETOH abuse, HTN, dilated cardiomyopathy, and anxiety admitted from the ED on 4/1 with headache x 1 week, diffuse abdominal pain x 5 days and chronic worsening chest pain.  [name] were elevated but have been trending down.  Abd [place] was negative.  Cardiac biomarkers negative.  Last drink on 4/1 12AM.  Patient has been on CIWA protocol since admission but has become increasingly confused and combative despite treatment.  The patient was not able to be managed on the floor and has been admitted to the MICU for management of withdraw.        SIGNIFICANT EVENTS / STUDIES:  4/1 Echo - Mild LVH. Systolic function normal. The estimated EF 55-60%. 4/3 intubation   LINES / TUBES: 4/3 ETT>>> 4/3 R IJ >>>  CULTURES: 4/3 Urine ctx>>> negative  ANTIBIOTICS:  4/1 Diflucan 4/3  SUBJECTIVE:  Changed to Ativan 4/4, precedex added 4/4 pm with improvement in ativan requirements  VITAL SIGNS: Temp:  [98.2 F (36.8 C)-101 F (38.3 C)] 100.8 F (38.2 C) (04/05 0743) Pulse Rate:  [50-111] 70 (04/05 0740) Resp:  [16-31] 16 (04/05 0700) BP: (91-170)/(52-103) 103/63 mmHg (04/05 0740) SpO2:  [89 %-100 %] 97 % (04/05 0700) FiO2 (%):  [40 %] 40 % (04/05 0945) Weight:  [90.4 kg (199 lb 4.7 oz)] 90.4 kg (199 lb 4.7 oz) (04/04 2300) HEMODYNAMICS:    Vent Mode:  [-] PRVC FiO2 (%):  [40 %] 40 % Set Rate:  [16 bmp-20 bmp] 20 bmp Vt Set:  [700 mL] 700 mL PEEP:  [5 cmH20] 5 cmH20 Pressure Support:  [10 cmH20] 10 cmH20 Plateau Pressure:  [17 cmH20-25 cmH20] 25 cmH20 INTAKE / OUTPUT: Intake/Output     04/04 0701 - 04/05 0700 04/05 0701 - 04/06 0700   I.V. (mL/kg) 779.7 (8.6) 76 (0.8)   NG/GT  30   IV Piggyback  100   Total Intake(mL/kg) 779.7  (8.6) 206 (2.3)   Urine (mL/kg/hr) 2245 (1) 25 (0.1)   Emesis/NG output 1050 (0.5)    Total Output 3295 25   Net -2515.3 +181          PHYSICAL EXAMINATION: General: intubated.  RASS -2 Neuro:sedated, RASS -2 HEENT:  ETT Cardiovascular: S1S2- distant heart sounds, no m/g/r Lungs:  Coarse breath sounds, no wheezes  Abdomen: soft, normoactive bowel sounds, no distension  Musculoskeletal: Intact Skin:  Intact  LABS:  Recent Labs Lab 06/11/12 1632 06/12/12 0810 06/13/12 0423  NA 134* 138 140  K 3.2* 3.7 3.4*  CL 100 103 105  CO2 21 24 23   BUN 17 11 13   CREATININE 0.83 0.74 0.70  GLUCOSE 117* 101* 141*    Recent Labs Lab 06/09/12 1230 06/10/12 0512 06/11/12 0543  HGB 13.8 14.6 15.1  HCT 38.6* 41.0 42.5  WBC 4.7 5.6 6.2  PLT 99* 118* 115*    Recent Labs Lab 06/09/12 0802 06/10/12 0512 06/11/12 0543 06/13/12 0423  AST 185* 132* 88* 49*  ALT 127* 103* 82* 60*  ALKPHOS 105 102 100 98  BILITOT 0.9 1.8* 1.4* 1.2  PROT 7.3 7.1 7.0 6.8  ALBUMIN 4.4 4.0 4.0 3.4*  INR 0.96  --   --   --       Recent  Labs Lab 06/11/12 1645 06/11/12 2251 06/12/12 0445  TROPONINI <0.30 <0.30 <0.30     Recent Labs Lab 06/12/12 1619 06/12/12 1950 06/13/12 0027 06/13/12 0350 06/13/12 0733  GLUCAP 101* 135* 133* 153* 149*    CXR: 4/3 - Clear, no infiltrate  ASSESSMENT / PLAN:   PULMONARY A: VDRF , due to alcohol withdrawal and encephalopathy P:   - SBT as tolerated, MS will be the limiting factor here - check CXR 4/6 am  CARDIOVASCULAR A:  History of dilated cardiomyopathy- Likely secondary to alcohol abuse- Chest pain resolved, biomarkers negative, 4-1 Echo showed EF 55-60% Prolonged QTc > 550, patient had been on Methadone which can cause prolonged QTc and torsade.    P:  -Cont Asprin  -Cont Coreg  -hold Methadone -Will correct hypokalemia because a low serum potassium concentration enhances the degree of drug-induced inhibition of IKr, increasing the QT  interval. -Will continue to monitor QTc >> consider cards eval if persists with lyte correction -May need IV magnesium sulfate   RENAL A:  Hypokalemia  P:   -follow BMP -replete K as needed  GASTROINTESTINAL A: Abdominal pain - Elevated LFTs - Likely secondary to alcoholic hepatic disease, LFTs improving, Abd [place] negative except for fatty infiltrate of liver P:   -follow LFT intermittently    INFECTIOUS A:  No active issues. HIV negative  P:   - follow clinically  HEMATOLOGIC A:  thrombocytopenia, likely due to alcoholic liver disease.   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  history of w/d including seizures   P:   - continue ativan gtt + precedex, goal wean the ativan first  TODAY'S SUMMARY:    CC time 40 min  Levy Pupa, MD, PhD 06/13/2012, 9:48 AM [place] [facility] [phone] or if no answer [phone]

## 2012-06-13 NOTE — Progress Notes (Signed)
[facility] ADULT ICU REPLACEMENT PROTOCOL FOR AM LAB REPLACEMENT ONLY  The patient does} apply for the [facility] Adult ICU Electrolyte Replacment Protocol based on the criteria listed below:   1. Is GFR >/= 40 ml/min? yes  Patient's GFR today is 90 2. Is urine output >/= 0.5 ml/kg/hr for the last 6 hours? yes Patient's UOP is 0.8 ml/kg/hr 3. Is BUN < 60 mg/dL? yes  Patient's BUN today is 13 4. Abnormal electrolyte(s): K+ 3.4 5. Ordered repletion with: KCL 20 meq  6. If a panic level lab has been reported, has the CCM MD in charge been notified? yes.   Physician:  Dr. Higinio Plan, Sintia Mckissic A 06/13/2012 6:49 AM

## 2012-06-13 NOTE — Progress Notes (Deleted)
Informed Md Deterding of patients continued decline in SpO2 with Bipap on. MD informed this RN to increase pressure support for patient. RT called and informed of MD decision. RT at bedside at this time.  

## 2012-06-14 ENCOUNTER — Inpatient Hospital Stay (HOSPITAL_COMMUNITY): Payer: MEDICAID

## 2012-06-14 DIAGNOSIS — E876 Hypokalemia: Secondary | ICD-10-CM

## 2012-06-14 DIAGNOSIS — J9600 Acute respiratory failure, unspecified whether with hypoxia or hypercapnia: Secondary | ICD-10-CM

## 2012-06-14 LAB — URINALYSIS, ROUTINE W REFLEX MICROSCOPIC
Bilirubin Urine: NEGATIVE
Glucose, UA: NEGATIVE mg/dL
Ketones, ur: NEGATIVE mg/dL
pH: 5.5 (ref 5.0–8.0)

## 2012-06-14 LAB — BASIC METABOLIC PANEL
BUN: 17 mg/dL (ref 6–23)
CO2: 24 mEq/L (ref 19–32)
Chloride: 105 mEq/L (ref 96–112)
GFR calc Af Amer: >90 mL/min (ref >90)
Potassium: 3.2 mEq/L — ABNORMAL LOW (ref 3.5–5.1)

## 2012-06-14 LAB — GLUCOSE, CAPILLARY
Glucose-Capillary: 136 mg/dL — ABNORMAL HIGH (ref 70–99)
Glucose-Capillary: 134 mg/dL — ABNORMAL HIGH (ref 70–99)

## 2012-06-14 LAB — URINE MICROSCOPIC-ADD ON

## 2012-06-14 MED ORDER — POTASSIUM CHLORIDE 10 MEQ/50ML IV SOLN
10.0000 meq | INTRAVENOUS | Status: AC
  Administered 2012-06-14 (×4): 10 meq via INTRAVENOUS
  Filled 2012-06-14: qty 100
  Filled 2012-06-14 (×2): qty 50

## 2012-06-14 MED ORDER — JEVITY 1.2 CAL PO LIQD
1000.0000 mL | ORAL | Status: DC
  Filled 2012-06-14 (×3): qty 1000

## 2012-06-14 MED ORDER — BISACODYL 10 MG RE SUPP: 10.0000 mg | Freq: Every day | RECTAL | Status: DC | PRN

## 2012-06-14 MED ORDER — DOCUSATE SODIUM 50 MG/5ML PO LIQD
100.0000 mg | Freq: Every day | ORAL | Status: DC
  Administered 2012-06-14 – 2012-06-19 (×5): 100 mg
  Filled 2012-06-14 (×7): qty 10

## 2012-06-14 MED ORDER — DOCUSATE SODIUM 50 MG/5ML PO LIQD: 100.0000 mg | Freq: Every day | ORAL | Status: DC

## 2012-06-14 MED ORDER — PRO-STAT SUGAR FREE PO LIQD
60.0000 mL | Freq: Three times a day (TID) | ORAL | Status: DC
  Administered 2012-06-14 – 2012-06-15 (×2): 60 mL
  Filled 2012-06-14 (×6): qty 60

## 2012-06-14 MED ORDER — SORBITOL 70 % SOLN
15.0000 mL | Freq: Every day | Status: DC | PRN
  Administered 2012-06-18: 15 mL
  Filled 2012-06-14: qty 30

## 2012-06-14 MED ORDER — SORBITOL 70 % SOLN: 15.0000 mL | Freq: Every day | Status: DC | PRN

## 2012-06-14 MED ORDER — DEXMEDETOMIDINE HCL IN NACL 200 MCG/50ML IV SOLN
0.4000 ug/kg/h | INTRAVENOUS | Status: DC
  Administered 2012-06-14: 1 ug/kg/h via INTRAVENOUS
  Administered 2012-06-14 (×2): 1.5 ug/kg/h via INTRAVENOUS
  Administered 2012-06-14: 1 ug/kg/h via INTRAVENOUS
  Administered 2012-06-14 – 2012-06-15 (×3): 1.5 ug/kg/h via INTRAVENOUS
  Administered 2012-06-15: 1.2 ug/kg/h via INTRAVENOUS
  Administered 2012-06-15: 1.5 ug/kg/h via INTRAVENOUS
  Administered 2012-06-15: 1.2 ug/kg/h via INTRAVENOUS
  Administered 2012-06-15 – 2012-06-16 (×2): 2 ug/kg/h via INTRAVENOUS
  Administered 2012-06-16 (×2): 1.5 ug/kg/h via INTRAVENOUS
  Administered 2012-06-16: 1.7 ug/kg/h via INTRAVENOUS
  Administered 2012-06-16 (×2): 2 ug/kg/h via INTRAVENOUS
  Administered 2012-06-16 (×2): 1.7 ug/kg/h via INTRAVENOUS
  Administered 2012-06-17 (×11): 2 ug/kg/h via INTRAVENOUS
  Administered 2012-06-18 (×2): 2.2 ug/kg/h via INTRAVENOUS
  Administered 2012-06-18: 2 ug/kg/h via INTRAVENOUS
  Administered 2012-06-18: 2.5 ug/kg/h via INTRAVENOUS
  Administered 2012-06-18 (×2): 2.2 ug/kg/h via INTRAVENOUS
  Administered 2012-06-18: 2.5 ug/kg/h via INTRAVENOUS
  Administered 2012-06-18 (×3): 2.2 ug/kg/h via INTRAVENOUS
  Administered 2012-06-18: 2.5 ug/kg/h via INTRAVENOUS
  Administered 2012-06-19: 2.2 ug/kg/h via INTRAVENOUS
  Administered 2012-06-19: 1 ug/kg/h via INTRAVENOUS
  Administered 2012-06-19: 2.2 ug/kg/h via INTRAVENOUS
  Administered 2012-06-19: 2.3 ug/kg/h via INTRAVENOUS
  Administered 2012-06-19 (×3): 2.2 ug/kg/h via INTRAVENOUS
  Administered 2012-06-19: 2.5 ug/kg/h via INTRAVENOUS
  Administered 2012-06-19: 2 ug/kg/h via INTRAVENOUS
  Administered 2012-06-19: 2.2 ug/kg/h via INTRAVENOUS
  Administered 2012-06-20: 1.5 ug/kg/h via INTRAVENOUS
  Administered 2012-06-20 (×4): 2 ug/kg/h via INTRAVENOUS
  Administered 2012-06-20: 1 ug/kg/h via INTRAVENOUS
  Administered 2012-06-20: 2.5 ug/kg/h via INTRAVENOUS
  Administered 2012-06-21: 1.2 ug/kg/h via INTRAVENOUS
  Administered 2012-06-21 (×3): 2 ug/kg/h via INTRAVENOUS
  Administered 2012-06-21: 1.2 ug/kg/h via INTRAVENOUS
  Administered 2012-06-21: 2 ug/kg/h via INTRAVENOUS
  Administered 2012-06-21: 2.2 ug/kg/h via INTRAVENOUS
  Administered 2012-06-21: 1.8 ug/kg/h via INTRAVENOUS
  Administered 2012-06-22 (×2): 1.2 ug/kg/h via INTRAVENOUS
  Administered 2012-06-22 – 2012-06-23 (×3): 0.7 ug/kg/h via INTRAVENOUS
  Administered 2012-06-23: 0.6 ug/kg/h via INTRAVENOUS
  Filled 2012-06-14 (×5): qty 100
  Filled 2012-06-14: qty 200
  Filled 2012-06-14 (×3): qty 100
  Filled 2012-06-14: qty 50
  Filled 2012-06-14 (×3): qty 100
  Filled 2012-06-14: qty 50
  Filled 2012-06-14 (×5): qty 100
  Filled 2012-06-14: qty 200
  Filled 2012-06-14 (×30): qty 100
  Filled 2012-06-14: qty 200
  Filled 2012-06-14: qty 100
  Filled 2012-06-14: qty 50
  Filled 2012-06-14 (×11): qty 100
  Filled 2012-06-14: qty 200
  Filled 2012-06-14 (×7): qty 100
  Filled 2012-06-14: qty 200
  Filled 2012-06-14 (×3): qty 100

## 2012-06-14 NOTE — Progress Notes (Signed)
eLink Nursing ICU Electrolyte Replacement Protocol  Patient Name: Mark [name] DOB: June 13, 1965 MRN: [id number]  Date of Service  06/14/2012 K+3.2 Criteria met. K+ replaced per protocol. MD notified  HPI/Events of Note    Recent Labs Lab 06/09/12 0802 06/09/12 1230 06/10/12 0512 06/11/12 0543 06/11/12 1632 06/12/12 0810 06/13/12 0423 06/14/12 0500  NA 140  --  137 137 134* 138 140 139  K 2.9*  --  3.7 3.5 3.2* 3.7 3.4* 3.2*  CL 94*  --  97 99 100 103 105 105  CO2 27  --  24 23 21 24 23 24   GLUCOSE 97  --  88 111* 117* 101* 141* 150*  BUN 4*  --  7 13 17 11 13 17   CREATININE 0.66 0.64 0.66 0.71 0.83 0.74 0.70 0.58  CALCIUM 9.0  --  9.3 9.6 8.8 9.0 9.0 9.2  MG  --  1.9 2.2  --  2.1  --   --   --   PHOS  --  3.2  --   --  3.8  --   --   --     Estimated Creatinine Clearance: 141.7 ml/min (by C-G formula based on Cr of 0.58).  Intake/Output     04/05 0701 - 04/06 0700   I.V. (mL/kg) 1118.7 (12.4)   NG/GT 260   IV Piggyback 100   Total Intake(mL/kg) 1478.7 (16.4)   Urine (mL/kg/hr) 805 (0.4)   Emesis/NG output 950 (0.4)   Other 150 (0.1)   Total Output 1905   Net -426.3        - I/O DETAILED x24h    Total I/O In: 564.5 [I.V.:564.5] Out: 900 [Urine:500; Emesis/NG output:250; Other:150] - I/O THIS SHIFT    ASSESSMENT   eICURN Interventions     ASSESSMENT: MAJOR ELECTROLYTE    Merita Norton 06/14/2012, 6:40 AM

## 2012-06-14 NOTE — Progress Notes (Signed)
EKG done and faxed to [facility]

## 2012-06-14 NOTE — Progress Notes (Signed)
Patient bladder scanned due to no urine output this shift. 312 ml currently in bladder. Foley removed prior shift. Findings reported to MD Deterding- elink. Orders to re-insert catheter due to acute urinary retention.

## 2012-06-14 NOTE — Progress Notes (Signed)
PULMONARY  / CRITICAL CARE MEDICINE  Name: Mark [name] MRN: [id number] DOB: 29-Sep-1965    ADMISSION DATE:  06/09/2012 CONSULTATION DATE:  06/11/12  REFERRING MD :  VANN/TRH  CHIEF COMPLAINT:  ETOH withdraw   BRIEF PATIENT DESCRIPTION: 47 y/o with history of ETOH abuse, HTN, dilated cardiomyopathy, and anxiety admitted from the ED on 4/1 with headache x 1 week, diffuse abdominal pain x 5 days and chronic worsening chest pain.  [name] were elevated but have been trending down.  Abd [place] was negative.  Cardiac biomarkers negative.  Last drink on 4/1 12AM.  Patient has been on CIWA protocol since admission but has become increasingly confused and combative despite treatment.  The patient was not able to be managed on the floor and has been admitted to the MICU for management of withdraw.        SIGNIFICANT EVENTS / STUDIES:  4/1 Echo - Mild LVH. Systolic function normal. The estimated EF 55-60%. 4/3 intubation   LINES / TUBES: 4/3 ETT>>> 4/3 R IJ >>>  CULTURES: 4/3 Urine ctx>>> negative  ANTIBIOTICS:  4/1 Diflucan 4/3  SUBJECTIVE:  Changed to Ativan 4/4, precedex added 4/4 pm. He was tried to wean off from ativan yesterday but had to be added back on again last night because of agitation He has been having problems with urinary retention - had I and O cath last night 4/5  VITAL SIGNS: Temp:  [97.7 F (36.5 C)-99.8 F (37.7 C)] 99.8 F (37.7 C) (04/06 0747) Pulse Rate:  [67-78] 71 (04/06 0736) Resp:  [16-32] 16 (04/06 0736) BP: (101-139)/(53-82) 119/67 mmHg (04/06 0736) SpO2:  [88 %-98 %] 96 % (04/06 0736) FiO2 (%):  [40 %] 40 % (04/06 0736) HEMODYNAMICS:    Vent Mode:  [-] PRVC FiO2 (%):  [40 %] 40 % Set Rate:  [16 bmp-20 bmp] 16 bmp Vt Set:  [700 mL] 700 mL PEEP:  [5 cmH20] 5 cmH20 Pressure Support:  [10 cmH20] 10 cmH20 Plateau Pressure:  [10 cmH20-24 cmH20] 23 cmH20 INTAKE / OUTPUT: Intake/Output     04/05 0701 - 04/06 0700 04/06 0701 - 04/07 0700   I.V. (mL/kg)  1206.3 (13.3)    NG/GT 260    IV Piggyback 100 50   Total Intake(mL/kg) 1566.3 (17.3) 50 (0.6)   Urine (mL/kg/hr) 815 (0.4)    Emesis/NG output 950 (0.4)    Other 150 (0.1)    Total Output 1915     Net -348.7 +50          PHYSICAL EXAMINATION: General: intubated.  RASS -2 Neuro:sedated, RASS -2 >> can become acutely agitated at times HEENT:  ETT Cardiovascular: S1S2- distant heart sounds, no m/g/r Lungs:  Coarse breath sounds, no wheezes  Abdomen: soft, normoactive bowel sounds, no distension  Musculoskeletal: Intact Skin:  Intact  LABS:  Recent Labs Lab 06/12/12 0810 06/13/12 0423 06/14/12 0500  NA 138 140 139  K 3.7 3.4* 3.2*  CL 103 105 105  CO2 24 23 24   BUN 11 13 17   CREATININE 0.74 0.70 0.58  GLUCOSE 101* 141* 150*    Recent Labs Lab 06/09/12 1230 06/10/12 0512 06/11/12 0543  HGB 13.8 14.6 15.1  HCT 38.6* 41.0 42.5  WBC 4.7 5.6 6.2  PLT 99* 118* 115*    Recent Labs Lab 06/09/12 0802 06/10/12 0512 06/11/12 0543 06/13/12 0423  AST 185* 132* 88* 49*  ALT 127* 103* 82* 60*  ALKPHOS 105 102 100 98  BILITOT 0.9 1.8* 1.4* 1.2  PROT 7.3 7.1 7.0 6.8  ALBUMIN 4.4 4.0 4.0 3.4*  INR 0.96  --   --   --       Recent Labs Lab 06/11/12 1645 06/11/12 2251 06/12/12 0445  TROPONINI <0.30 <0.30 <0.30     Recent Labs Lab 06/12/12 1950 06/13/12 0027 06/13/12 0350 06/13/12 0733 06/13/12 1127  GLUCAP 135* 133* 153* 149* 160*    CXR: 4/3 - Clear, no infiltrate  ASSESSMENT / PLAN:   PULMONARY A: VDRF , due to alcohol withdrawal and encephalopathy P:   - SBT as tolerated, MS will be the limiting factor here - obtain CXR 4/6 am  CARDIOVASCULAR A:  History of dilated cardiomyopathy- Likely secondary to alcohol abuse- Chest pain resolved, biomarkers negative, 4-1 Echo showed EF 55-60% Prolonged QTc > 550, patient had been on Methadone which can cause prolonged QTc and torsade.    P:  -Cont Asprin  -Cont Coreg  -hold Methadone -Will  correct hypokalemia because a low serum potassium concentration enhances the degree of drug-induced inhibition of IKr, increasing the QT interval. -Will continue to monitor QTc >> consider cards eval if persists with lyte correction -check Mg 5/7   RENAL A:  Hypokalemia, K-3.2 - today- repleted Urinary retention- Bladder scan ~300 cc.   P:   -follow BMP -replete K as needed, check Mg 5/7 -replace foley 5/6 due to urinary retention  GASTROINTESTINAL A: Abdominal pain - Elevated LFTs - Likely secondary to alcoholic hepatic disease, LFTs improving, Abd [place] negative except for fatty infiltrate of liver Constipation, high GT output > ? ileus P:   - follow LFT intermittently  - check viral hepatitis panel - KUB am 4/6 - start bowel regimen - try to start TF, follow for residuals.    INFECTIOUS A:  No active issues. HIV negative P:   - follow clinically  HEMATOLOGIC A:  thrombocytopenia, likely due to alcoholic liver disease.  Platelets stable.  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  history of w/d including seizures   P:   - liberalize precedex max dose to 2.5 and follow; watch for bradycardia - continue ativan gtt + precedex, goal wean the ativan first, but do not anticipate that this will be weaned completely off for several days  TODAY'S SUMMARY:    CC time 40 min  SAWHNEY,MEGHA 06/14/2012 7:54 AM  Levy Pupa, MD, PhD 06/14/2012, 10:56 AM [facility] [facility] [phone] or if no answer [phone]

## 2012-06-14 NOTE — Progress Notes (Signed)
MD notified of increasing output from pt's OG tube connected to intermittent suction, <600 ml since 0700.  Tube feeding and prostat held per verbal order.

## 2012-06-14 NOTE — Progress Notes (Addendum)
MD notified of decreased urine output from pt's condom cath.  Bladder scan conducted, >298 ml.  Foley catheter ordered for urinary retention.

## 2012-06-15 ENCOUNTER — Inpatient Hospital Stay (HOSPITAL_COMMUNITY): Payer: MEDICAID

## 2012-06-15 LAB — BASIC METABOLIC PANEL
BUN: 17 mg/dL (ref 6–23)
Chloride: 105 mEq/L (ref 96–112)
GFR calc Af Amer: >90 mL/min (ref >90)
Potassium: 3.4 mEq/L — ABNORMAL LOW (ref 3.5–5.1)
Sodium: 140 mEq/L (ref 135–145)

## 2012-06-15 LAB — HEPATITIS PANEL, ACUTE
Hep A IgM: NEGATIVE
Hep B C IgM: NEGATIVE
Hepatitis B Surface Ag: NEGATIVE

## 2012-06-15 LAB — GLUCOSE, CAPILLARY
Glucose-Capillary: 123 mg/dL — ABNORMAL HIGH (ref 70–99)
Glucose-Capillary: 135 mg/dL — ABNORMAL HIGH (ref 70–99)
Glucose-Capillary: 140 mg/dL — ABNORMAL HIGH (ref 70–99)
Glucose-Capillary: 155 mg/dL — ABNORMAL HIGH (ref 70–99)

## 2012-06-15 LAB — CBC
Hemoglobin: 13.5 g/dL (ref 13.0–17.0)
MCH: 32.6 pg (ref 26.0–34.0)
RBC: 4.14 MIL/uL — ABNORMAL LOW (ref 4.22–5.81)
WBC: 8.1 10*3/uL (ref 4.0–10.5)

## 2012-06-15 LAB — POTASSIUM: Potassium: 3.4 mEq/L — ABNORMAL LOW (ref 3.5–5.1)

## 2012-06-15 MED ORDER — ALBUTEROL SULFATE (5 MG/ML) 0.5% IN NEBU: 2.5000 mg | INHALATION_SOLUTION | RESPIRATORY_TRACT | Status: DC | PRN

## 2012-06-15 MED ORDER — JEVITY 1.2 CAL PO LIQD
1000.0000 mL | ORAL | Status: DC
  Filled 2012-06-15 (×3): qty 1000

## 2012-06-15 MED ORDER — PIPERACILLIN-TAZOBACTAM 3.375 G IVPB
3.3750 g | Freq: Three times a day (TID) | INTRAVENOUS | Status: DC
  Administered 2012-06-15 – 2012-06-22 (×20): 3.375 g via INTRAVENOUS
  Filled 2012-06-15 (×24): qty 50

## 2012-06-15 MED ORDER — PRO-STAT SUGAR FREE PO LIQD
30.0000 mL | Freq: Two times a day (BID) | ORAL | Status: DC
  Administered 2012-06-16 – 2012-06-19 (×8): 30 mL
  Filled 2012-06-15 (×11): qty 30

## 2012-06-15 MED ORDER — POTASSIUM CHLORIDE 20 MEQ/15ML (10%) PO LIQD
40.0000 meq | Freq: Once | ORAL | Status: AC
  Administered 2012-06-15: 40 meq
  Filled 2012-06-15: qty 30

## 2012-06-15 MED ORDER — VECURONIUM BROMIDE 10 MG IV SOLR
INTRAVENOUS | Status: AC
  Filled 2012-06-15: qty 10

## 2012-06-15 MED ORDER — HYDRALAZINE HCL 20 MG/ML IJ SOLN: 5.0000 mg | Freq: Once | INTRAMUSCULAR | Status: AC

## 2012-06-15 MED ORDER — POTASSIUM CHLORIDE 10 MEQ/50ML IV SOLN
10.0000 meq | INTRAVENOUS | Status: AC
  Administered 2012-06-15 (×4): 10 meq via INTRAVENOUS
  Filled 2012-06-15: qty 150
  Filled 2012-06-15: qty 50

## 2012-06-15 MED ORDER — FENTANYL CITRATE 0.05 MG/ML IJ SOLN
INTRAMUSCULAR | Status: AC
  Administered 2012-06-15: 200 ug
  Filled 2012-06-15: qty 4

## 2012-06-15 MED ORDER — HYDRALAZINE HCL 20 MG/ML IJ SOLN
INTRAMUSCULAR | Status: AC
  Administered 2012-06-15: 5 mg via INTRAVENOUS
  Filled 2012-06-15: qty 1

## 2012-06-15 MED ORDER — MIDAZOLAM HCL 2 MG/2ML IJ SOLN
INTRAMUSCULAR | Status: AC
  Administered 2012-06-15: 2 mg
  Filled 2012-06-15: qty 6

## 2012-06-15 MED ORDER — INSULIN ASPART 100 UNIT/ML ~~LOC~~ SOLN
0.0000 [IU] | SUBCUTANEOUS | Status: DC
  Administered 2012-06-16 (×2): 1 [IU] via SUBCUTANEOUS
  Administered 2012-06-16: 2 [IU] via SUBCUTANEOUS
  Administered 2012-06-16 – 2012-06-17 (×2): 1 [IU] via SUBCUTANEOUS
  Administered 2012-06-17: 2 [IU] via SUBCUTANEOUS
  Administered 2012-06-17 (×4): 1 [IU] via SUBCUTANEOUS
  Administered 2012-06-18: 2 [IU] via SUBCUTANEOUS
  Administered 2012-06-18 (×3): 1 [IU] via SUBCUTANEOUS
  Administered 2012-06-18: 2 [IU] via SUBCUTANEOUS
  Administered 2012-06-18 – 2012-06-19 (×4): 1 [IU] via SUBCUTANEOUS
  Administered 2012-06-19: 2 [IU] via SUBCUTANEOUS
  Administered 2012-06-19 – 2012-06-20 (×3): 1 [IU] via SUBCUTANEOUS
  Administered 2012-06-20: 2 [IU] via SUBCUTANEOUS
  Administered 2012-06-20 – 2012-06-22 (×4): 1 [IU] via SUBCUTANEOUS

## 2012-06-15 MED ORDER — ACETAMINOPHEN 650 MG RE SUPP: 650.0000 mg | RECTAL | Status: DC | PRN

## 2012-06-15 MED ORDER — ETOMIDATE 2 MG/ML IV SOLN
INTRAVENOUS | Status: AC
  Administered 2012-06-15: 20 mg
  Filled 2012-06-15: qty 10

## 2012-06-15 MED ORDER — VANCOMYCIN HCL IN DEXTROSE 1-5 GM/200ML-% IV SOLN
1000.0000 mg | Freq: Three times a day (TID) | INTRAVENOUS | Status: DC
  Administered 2012-06-15 – 2012-06-17 (×6): 1000 mg via INTRAVENOUS
  Filled 2012-06-15 (×8): qty 200

## 2012-06-15 MED ORDER — METOPROLOL TARTRATE 1 MG/ML IV SOLN
5.0000 mg | INTRAVENOUS | Status: DC | PRN
  Administered 2012-06-15 – 2012-06-17 (×2): 5 mg via INTRAVENOUS
  Filled 2012-06-15 (×2): qty 5

## 2012-06-15 NOTE — Progress Notes (Signed)
eLink Physician-Brief Progress Note Patient Name: YUVAN MEDINGER DOB: 1965/07/03 MRN: [id number]  Date of Service  06/15/2012   HPI/Events of Note  Hypokalemia   eICU Interventions  Potassium replaced   Intervention Category Minor Interventions: Electrolytes abnormality - evaluation and management  Jada Kuhnert 06/15/2012, 5:48 AM

## 2012-06-15 NOTE — Progress Notes (Signed)
UR Completed.  Mark [name] Jane [phone] 06/15/2012  

## 2012-06-15 NOTE — Progress Notes (Signed)
NUTRITION FOLLOW UP  Intervention:    When NG tube output decreases and ready for TF, start Jevity 1.2 at 25 ml/h, increase by 10 ml every 4 hours to goal rate of 75 ml/h with Prostat 30 ml BID to provide 2360 kcals, 130 gm protein, 1458 ml free water daily.  Nutrition Dx:   Inadequate oral intake related to inability to eat as evidenced by NPO status. Ongoing.  Goal:  Intake to meet >90% of estimated nutrition needs. Unmet.  Monitor:  TF initiation/tolerance/adequacy, weight trend, labs, vent status.   Assessment:   Patient remains intubated on ventilator support.  MV: 13.3 Temp:Temp (24hrs), Avg:98.7 F (37.1 C), Min:97.9 F (36.6 C), Max:101.2 F (38.4 C)  Propofol is now off. Received MD Consult for TF initiation and management. Jevity 1.2 has been ordered; not yet started due to bilious NGT output per RN. 1,250 ml output since 6 PM yesterday.   Height: Ht Readings from Last 1 Encounters:  06/11/12 6\' 4"  (1.93 m)    Weight Status:   Wt Readings from Last 1 Encounters:  06/15/12 198 lb 6.6 oz (90 kg)  06/11/12  202 lb 13.2 oz (92 kg)  Weight down with negative fluid balance.  Re-estimated needs:  Kcal: 2425 Protein: 125-150 gm Fluid: 2.3-2.5 L  Skin: no problems noted  Diet Order: NPO   Intake/Output Summary (Last 24 hours) at 06/15/12 0938 Last data filed at 06/15/12 0900  Gross per 24 hour  Intake 1549.52 ml  Output   2175 ml  Net -625.48 ml    Last BM: 4/2   Labs:   Recent Labs Lab 06/09/12 0802  06/09/12 1230 06/10/12 0512  06/11/12 1632  06/13/12 0423 06/14/12 0500 06/15/12 0400  NA 140  --   --  137  < > 134*  < > 140 139 140  K 2.9*  --   --  3.7  < > 3.2*  < > 3.4* 3.2* 3.4*  CL 94*  --   --  97  < > 100  < > 105 105 105  CO2 27  --   --  24  < > 21  < > 23 24 25   BUN 4*  --   --  7  < > 17  < > 13 17 17   CREATININE 0.66  --  0.64 0.66  < > 0.83  < > 0.70 0.58 0.63  CALCIUM 9.0  --   --  9.3  < > 8.8  < > 9.0 9.2 9.5  MG  --   <  > 1.9 2.2  --  2.1  --   --   --  2.5  PHOS  --   --  3.2  --   --  3.8  --   --   --   --   GLUCOSE 97  --   --  88  < > 117*  < > 141* 150* 138*  < > = values in this interval not displayed.  CBG (last 3)   Recent Labs  06/14/12 1523 06/15/12 06/15/12 0840  GLUCAP 146* 140* 123*    Scheduled Meds: . antiseptic oral rinse  15 mL Mouth Rinse QID  . aspirin EC  325 mg Oral Daily  . carvedilol  12.5 mg Oral BID WC  . chlorhexidine  15 mL Mouth Rinse BID  . docusate  100 mg Per Tube Daily  . feeding supplement (JEVITY 1.2 CAL)  1,000 mL Per Tube Q24H  .  feeding supplement  60 mL Per Tube TID WC  . folic acid  1 mg Oral Daily  . heparin  5,000 Units Subcutaneous Q8H  . losartan  50 mg Oral BID  . multivitamin with minerals  1 tablet Oral Daily  . nicotine  14 mg Transdermal Daily  . pantoprazole (PROTONIX) IV  40 mg Intravenous Q12H  . sodium chloride  3 mL Intravenous Q12H  . thiamine  100 mg Oral Daily   Or  . thiamine  100 mg Intravenous Daily    Continuous Infusions: . sodium chloride 20 mL/hr at 06/15/12 0800  . dexmedetomidine 1.2 mcg/kg/hr (06/15/12 0800)  . LORazepam (ATIVAN) infusion 2 mg/hr (06/15/12 0800)  . norepinephrine (LEVOPHED) Adult infusion Stopped (06/12/12 0858)    Joaquin Courts, RD, LDN, CNSC Pager [phone] After Hours Pager [phone]

## 2012-06-15 NOTE — Clinical Social Work Psychosocial (Signed)
     Clinical Social Work Department BRIEF PSYCHOSOCIAL ASSESSMENT 06/15/2012  Patient:  Mark [name], Mark [name]     Account Number:  [id number]     Admit date:  06/09/2012  Clinical Social Worker:  Margaree Mackintosh  Date/Time:  06/12/2012 12:00 M  Referred by:  Care Management  Date Referred:  06/12/2012 Referred for  Substance Abuse   Other Referral:   Interview type:  Other - See comment Other interview type:   RN    PSYCHOSOCIAL DATA Living Status:  FAMILY Admitted from facility:   Level of care:   Primary support name:  Karn Cassis: [phone] Primary support relationship to patient:  PARENT Degree of support available:   Unknown.    CURRENT CONCERNS Current Concerns  Substance Abuse   Other Concerns:    SOCIAL WORK ASSESSMENT / PLAN Clinical Social Worker recieved referral from RN and RNCM indicating pt admitted with ETOH withdrawl.  CSW reviewed chart,  no family at bedside.  Pt currently intubated and sedated. Pt with three admits to the hospital in the past 6 months.  CSW to sign off at this time, please re consult as pt progresses and is able to participate in assessment.   Assessment/plan status:  Information/Referral to [facility] Other assessment/ plan:   Information/referral to community resources:    PATIENTS/FAMILYS RESPONSE TO PLAN OF CARE: Pt currently unable to fully participate in assessment due to being vented and sedated.

## 2012-06-15 NOTE — Progress Notes (Signed)
PULMONARY  / CRITICAL CARE MEDICINE  Name: MCCADE SULLENBERGER MRN: [id number] DOB: [birth year]    ADMISSION DATE:  06/09/2012 CONSULTATION DATE:  06/11/12  REFERRING MD :  VANN/TRH  CHIEF COMPLAINT:  ETOH withdraw   BRIEF PATIENT DESCRIPTION: 47 y/o with history of ETOH abuse, HTN, dilated cardiomyopathy, and anxiety admitted from the ED on 4/1 with headache x 1 week, diffuse abdominal pain x 5 days and chronic worsening chest pain.  [name] were elevated but have been trending down.  Abd [place] was negative.  Cardiac biomarkers negative.  Last drink on 4/1 12AM.  Patient has been on CIWA protocol since admission but has become increasingly confused and combative despite treatment.  The patient was not able to be managed on the floor and has been admitted to the MICU for management of withdraw.        SIGNIFICANT EVENTS / STUDIES:  4/1 Echo - Mild LVH. Systolic function normal. The estimated EF 55-60%. 4/3 intubation   LINES / TUBES: 4/3 ETT>>> 4/3 R IJ >>>  CULTURES: 4/3 Urine ctx>>> negative 4/7  Blood culture>> 4/7 sputum culture>>  ANTIBIOTICS:  4/1 Diflucan 4/3  Tests/events: Acute hepatitis panel negative. Because of increased output from NG tube- tube feeds were held.   Overnight events: no major events overnight.   SUBJECTIVE: sedated on vent, but ativan lower than 4/6 He spiked a fever with Tmax of 101.2 this Am.  High NGT output, TF d/c'd   VITAL SIGNS: Temp:  [97.9 F (36.6 C)-101.2 F (38.4 C)] 101.2 F (38.4 C) (04/07 0844) Pulse Rate:  [49-76] 73 (04/07 0800) Resp:  [16-30] 19 (04/07 0800) BP: (118-160)/(72-100) 118/100 mmHg (04/07 0800) SpO2:  [91 %-100 %] 95 % (04/07 0800) FiO2 (%):  [40 %] 40 % (04/07 0800) Weight:  [198 lb 6.6 oz (90 kg)] 198 lb 6.6 oz (90 kg) (04/07 0400) HEMODYNAMICS:    Vent Mode:  [-] PRVC FiO2 (%):  [40 %] 40 % Set Rate:  [16 bmp] 16 bmp Vt Set:  [700 mL] 700 mL PEEP:  [5 cmH20] 5 cmH20 Pressure Support:  [10 cmH20] 10  cmH20 Plateau Pressure:  [10 cmH20-20 cmH20] 17 cmH20 INTAKE / OUTPUT: Intake/Output     04/06 0701 - 04/07 0700 04/07 0701 - 04/08 0700   I.V. (mL/kg) 1341.6 (14.9)    NG/GT 210    IV Piggyback 150    Total Intake(mL/kg) 1701.6 (18.9)    Urine (mL/kg/hr) 925 (0.4) 50 (0.3)   Emesis/NG output 1150 (0.5)    Other     Total Output 2075 50   Net -373.4 -50          PHYSICAL EXAMINATION: General: intubated. ,  Neuro:sedated, not following commands, RASS 3 HEENT:  ETT Cardiovascular: S1S2- distant heart sounds, no m/g/r Lungs:  Coarse breath sounds, no wheezes  Abdomen: soft, normoactive bowel sounds, no distension  Musculoskeletal: Intact Skin:  Intact  LABS:  Recent Labs Lab 06/13/12 0423 06/14/12 0500 06/15/12 0400  NA 140 139 140  K 3.4* 3.2* 3.4*  CL 105 105 105  CO2 23 24 25   BUN 13 17 17   CREATININE 0.70 0.58 0.63  GLUCOSE 141* 150* 138*    Recent Labs Lab 06/10/12 0512 06/11/12 0543 06/15/12 0400  HGB 14.6 15.1 13.5  HCT 41.0 42.5 38.4*  WBC 5.6 6.2 8.1  PLT 118* 115* 218    Recent Labs Lab 06/09/12 0802 06/10/12 0512 06/11/12 0543 06/13/12 0423  AST 185* 132* 88* 49*  ALT 127* 103* 82* 60*  ALKPHOS 105 102 100 98  BILITOT 0.9 1.8* 1.4* 1.2  PROT 7.3 7.1 7.0 6.8  ALBUMIN 4.4 4.0 4.0 3.4*  INR 0.96  --   --   --       Recent Labs Lab 06/11/12 1645 06/11/12 2251 06/12/12 0445  TROPONINI <0.30 <0.30 <0.30     Recent Labs Lab 06/14/12 0731 06/14/12 1117 06/14/12 1523 06/15/12 06/15/12 0840  GLUCAP 145* 137* 146* 140* 123*    CXR: 4/6 - Minimal bibasilar atelectasis  ASSESSMENT / PLAN:   PULMONARY A: VDRF , due to alcohol withdrawal and encephalopathy P:   - SBT as tolerated, MS will be the limiting factor here   CARDIOVASCULAR A:  History of dilated cardiomyopathy- Likely secondary to alcohol abuse- Chest pain resolved, biomarkers negative, 4-1 Echo showed EF 55-60% Prolonged QTc > 550, patient had been on Methadone  which can cause prolonged QTc and torsade.    P:  -Cont Asprin  -Cont Coreg  -hold Methadone -Corrected hypokalemia because a low serum potassium concentration enhances the degree of drug-induced inhibition of IKr, increasing the QT interval.( Magnesium - wnl) -Will continue to monitor QTc    RENAL A:  Hypokalemia, K-3.4 - today- repleted  P:   -follow BMP -replete K as needed -FOLEY' S placed 2/2 urinary retention  GASTROINTESTINAL A: Abdominal pain - Elevated LFTs - Likely secondary to alcoholic hepatic disease, LFTs improving, Abd [place] negative except for fatty infiltrate of liver Constipation, high GT output > ? Ileus - Viral hepatitis panel- negative P:   - follow LFT intermittently  - KUB - 4/6: normal bowel gas pattern - TF held 2/2 increased NG output; convert PO meds to IV where necessary   INFECTIOUS A:   HIV negative. He spiked a fever with Tmax of 101.2 this AM. This could be 2/2 atelectasis. No WBC. P:   - Get blood,sputum cultures - Urine cultures in process - Tylenol suppository  HEMATOLOGIC A:  thrombocytopenia, likely due to alcoholic liver disease.  Platelets stable. 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  history of w/d including seizures   P:   - Currently on precedex dose 1.0 and ativan gtt @ 1 - liberalize precedex max dose to 2.5 if needed and follow; watch for bradycardia - continue ativan gtt + precedex, goal wean the ativan first, but do not anticipate that this will be weaned completely off for several days  TODAY'S SUMMARY:  Patient continues to be on ativan and precedex but requiring lesser doses than yesterday. He spiked a fever with Tmax of 101.2, would panculture him.   CC time 40 min  SAWHNEY,MEGHA 06/15/2012 9:02 AM  Levy Pupa, MD, PhD 06/15/2012, 9:02 AM [facility] [phone] or if no answer [phone]

## 2012-06-15 NOTE — Progress Notes (Signed)
The patient extubated himself; required reintubation; on direct laryngoscopy, abundant secretions noted; puss apereance.   Plan: initiate vanc and zosyn; f/u on respiratory cultures obtained today; repeat CXR post reintubation.

## 2012-06-15 NOTE — Procedures (Signed)
Name: AMARIO LONGMORE MRN: [id number] DOB: 1965-05-04  PROCEDURE NOTE  Procedure:  Endotracheal intubation.  Indication:  Acute respiratory failure  Consent:  Consent was implied due to the emergency nature of the procedure.  Anesthesia:  A total of 10 mg of Etomidate, 2 mg of Versed and 100 mcg of Fentanyl was given intravenously.  Procedure summary:  Appropriate equipment was assembled. The patient was identified as Karl Bales and safety timeout was performed. The patient was placed supine, with head in sniffing position. After adequate level of anesthesia was achieved, a Mac 3 blade was inserted into the oropharynx and the vocal cords were visualized. A 7.5 endotracheal tube was inserted without difficulty and visualized going through the vocal cords. The stylette was removed and cuff inflated. Colorimetric change was noted on the CO2 meter. Breath sounds were heard over both lung fields equally. Post procedure chest xray was ordered.  Complications:  No immediate complications were noted.  Hemodynamic parameters and oxygenation remained stable throughout the procedure.    Orlean Bradford, M.D. Pulmonary and Critical Care Medicine [facility] Cell: [phone] Pager: [phone]  06/15/2012, 7:22 PM

## 2012-06-15 NOTE — Progress Notes (Signed)
ANTIBIOTIC CONSULT NOTE - INITIAL  Pharmacy Consult for Zosyn and Vancomycin Indication: pneumonia  Allergies  Allergen Reactions  . Lisinopril Anaphylaxis and Swelling    Patient Measurements: Height: 6\' 4"  (193 cm) Weight: 198 lb 6.6 oz (90 kg) IBW/kg (Calculated) : 86.8 Adjusted Body Weight:   Vital Signs: Temp: 99.8 F (37.7 C) (04/07 1200) Temp src: Oral (04/07 1200) BP: 135/77 mmHg (04/07 1924) Pulse Rate: 65 (04/07 1926) Intake/Output from previous day: 04/06 0701 - 04/07 0700 In: 1701.6 [I.V.:1341.6; NG/GT:210; IV Piggyback:150] Out: 2075 [Urine:925; Emesis/NG output:1150] Intake/Output from this shift:    Labs:  Recent Labs  06/13/12 0423 06/14/12 0500 06/15/12 0400  WBC  --   --  8.1  HGB  --   --  13.5  PLT  --   --  218  CREATININE 0.70 0.58 0.63   Estimated Creatinine Clearance: 141.7 ml/min (by C-G formula based on Cr of 0.63). No results found for this basename: VANCOTROUGH, Leodis Binet, VANCORANDOM, GENTTROUGH, GENTPEAK, GENTRANDOM, TOBRATROUGH, TOBRAPEAK, TOBRARND, AMIKACINPEAK, AMIKACINTROU, AMIKACIN,  in the last 72 hours   Microbiology: Recent Results (from the past 720 hour(s))  MRSA PCR SCREENING     Status: None   Collection Time    06/11/12 11:53 AM      Result Value Range Status   MRSA by PCR NEGATIVE  NEGATIVE Final   Comment:            The GeneXpert MRSA Assay (FDA     approved for NASAL specimens     only), is one component of a     comprehensive MRSA colonization     surveillance program. It is not     intended to diagnose MRSA     infection nor to guide or     monitor treatment for     MRSA infections.  URINE CULTURE     Status: None   Collection Time    06/11/12  9:52 PM      Result Value Range Status   Specimen Description URINE, CATHETERIZED   Final   Special Requests NONE   Final   Culture  Setup Time 06/12/2012 03:18   Final   Colony Count NO GROWTH   Final   Culture NO GROWTH   Final   Report Status 06/13/2012  FINAL   Final    Medical History: Past Medical History  Diagnosis Date  . Hypertension   . Hyperlipidemia   . 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 ETOH abuse   . GERD (gastroesophageal reflux disease)   . Mental disorder   . Irregular heart beat   . Anxiety   . Shortness of breath   . Headache   . Hepatitis     Medications:  Scheduled:  . antiseptic oral rinse  15 mL Mouth Rinse QID  . chlorhexidine  15 mL Mouth Rinse BID  . docusate  100 mg Per Tube Daily  . etomidate      . feeding supplement (JEVITY 1.2 CAL)  1,000 mL Per Tube Q24H  . feeding supplement  30 mL Per Tube BID  . fentaNYL      . heparin  5,000 Units Subcutaneous Q8H  . [COMPLETED] hydrALAZINE  5 mg Intravenous Once  . midazolam      . nicotine  14 mg Transdermal Daily  . pantoprazole (PROTONIX) IV  40 mg Intravenous Q12H  . piperacillin-tazobactam (ZOSYN)  IV  3.375 g Intravenous  Q8H  . potassium chloride  10 mEq Intravenous Q1 Hr x 4  . [COMPLETED] potassium chloride  40 mEq Per Tube Once  . sodium chloride  3 mL Intravenous Q12H  . vancomycin  1,000 mg Intravenous Q8H  . [DISCONTINUED] aspirin EC  325 mg Oral Daily  . [DISCONTINUED] carvedilol  12.5 mg Oral BID WC  . [DISCONTINUED] feeding supplement (JEVITY 1.2 CAL)  1,000 mL Per Tube Q24H  . [DISCONTINUED] feeding supplement  60 mL Per Tube TID WC  . [DISCONTINUED] folic acid  1 mg Oral Daily  . [DISCONTINUED] losartan  50 mg Oral BID  . [DISCONTINUED] multivitamin with minerals  1 tablet Oral Daily  . [DISCONTINUED] thiamine  100 mg Intravenous Daily  . [DISCONTINUED] thiamine  100 mg Oral Daily   Assessment: 47 yr old male was admitted on 4/1 with abdominal pain and chest pain. He also wanted help with ETOH withdrawal. He was put on the CIWA protocol but became confused and combative. He is now to start Zosyn and Vancomycin for pneumonia.   Goal of Therapy:  Vancomycin trough level 15-20  mcg/ml  Plan:  1) Zosyn 3.375 Gm iv Q8H 2) Vancomycin 1 Gm IV q8h 3) Vancomycin trough level when appropriate.  Mark [name] 06/15/2012,8:19 PM

## 2012-06-15 NOTE — Progress Notes (Signed)
Hyperglycemia   SSI and CBG q4h

## 2012-06-16 ENCOUNTER — Inpatient Hospital Stay (HOSPITAL_COMMUNITY): Payer: MEDICAID

## 2012-06-16 LAB — URINE CULTURE

## 2012-06-16 LAB — CBC
MCH: 32.5 pg (ref 26.0–34.0)
MCHC: 35.3 g/dL (ref 30.0–36.0)
Platelets: 305 10*3/uL (ref 150–400)
RBC: 4.09 MIL/uL — ABNORMAL LOW (ref 4.22–5.81)
RDW: 14.2 % (ref 11.5–15.5)

## 2012-06-16 LAB — GLUCOSE, CAPILLARY
Glucose-Capillary: 122 mg/dL — ABNORMAL HIGH (ref 70–99)
Glucose-Capillary: 126 mg/dL — ABNORMAL HIGH (ref 70–99)
Glucose-Capillary: 148 mg/dL — ABNORMAL HIGH (ref 70–99)

## 2012-06-16 LAB — BASIC METABOLIC PANEL
Calcium: 9.5 mg/dL (ref 8.4–10.5)
Creatinine, Ser: 0.6 mg/dL (ref 0.50–1.35)
GFR calc Af Amer: >90 mL/min (ref >90)
GFR calc non Af Amer: >90 mL/min (ref >90)
Sodium: 141 mEq/L (ref 135–145)

## 2012-06-16 MED ORDER — DEXTROSE-NACL 5-0.45 % IV SOLN
INTRAVENOUS | Status: DC
  Administered 2012-06-16: 03:00:00 via INTRAVENOUS

## 2012-06-16 MED ORDER — POTASSIUM CHLORIDE 10 MEQ/50ML IV SOLN
10.0000 meq | INTRAVENOUS | Status: AC
  Administered 2012-06-16 (×2): 10 meq via INTRAVENOUS
  Filled 2012-06-16: qty 100

## 2012-06-16 MED ORDER — JEVITY 1.2 CAL PO LIQD
1000.0000 mL | ORAL | Status: DC
  Filled 2012-06-16 (×2): qty 1000

## 2012-06-16 MED ORDER — WHITE PETROLATUM GEL
Status: AC
  Administered 2012-06-16: 09:00:00
  Filled 2012-06-16: qty 5

## 2012-06-16 MED ORDER — JEVITY 1.2 CAL PO LIQD
1000.0000 mL | ORAL | Status: DC
  Administered 2012-06-16: 17:00:00
  Filled 2012-06-16: qty 1000

## 2012-06-16 MED ORDER — POTASSIUM CHLORIDE 10 MEQ/50ML IV SOLN
10.0000 meq | INTRAVENOUS | Status: AC
  Administered 2012-06-16 (×3): 10 meq via INTRAVENOUS

## 2012-06-16 MED ORDER — POTASSIUM CHLORIDE 10 MEQ/50ML IV SOLN
INTRAVENOUS | Status: AC
  Administered 2012-06-16: 10 meq via INTRAVENOUS
  Filled 2012-06-16: qty 200

## 2012-06-16 NOTE — Progress Notes (Signed)
Pt placed on psv;cpap. Pt tolerated well for about 20 minutes.  Increased RR. Pt placed back on full support with previous vent settings. Pt tolerating well at this time. No complications at this time. RT will continue to monitor.

## 2012-06-16 NOTE — Progress Notes (Signed)
PULMONARY  / CRITICAL CARE MEDICINE  Name: Mark [name] MRN: [id number] DOB: 1965-05-17    ADMISSION DATE:  06/09/2012 CONSULTATION DATE:  06/11/12  REFERRING MD :  VANN/TRH  CHIEF COMPLAINT:  ETOH withdraw   BRIEF PATIENT DESCRIPTION: 47 y/o with history of ETOH abuse, HTN, dilated cardiomyopathy, and anxiety admitted from the ED on 4/1 with headache x 1 week, diffuse abdominal pain x 5 days and chronic worsening chest pain.  [name] were elevated but have been trending down.  Abd [place] was negative.  Cardiac biomarkers negative.  Last drink on 4/1 12AM.  Patient has been on CIWA protocol since admission but has become increasingly confused and combative despite treatment.  The patient was not able to be managed on the floor and has been admitted to the MICU for management of withdraw.        SIGNIFICANT EVENTS / STUDIES:  4/1 Echo - Mild LVH. Systolic function normal. The estimated EF 55-60%. 4/3 intubation   LINES / TUBES: 4/3 ETT>>> 4/3 R IJ >>>  CULTURES: 4/3 Urine ctx>>> negative 4/7  Blood culture>> 4/7 sputum culture>>  ANTIBIOTICS:  4/1 Diflucan 4/3 Vanc 4/7>> Zosyn 4/7>>  Tests/events:  4/7: Patient self extubated and had to be re- intubated.On direct laryngoscopy abundant secretions were noted that appeared like pus and he was started on broad spectrum antibiotics.   Overnight events:  Self extubation >> reintubated, started broad spect abx  SUBJECTIVE: sedated on vent, but ativan lower than 4/6 He spiked a fever with Tmax of 101.2 this Am.  High NGT output, TF d/c'd   VITAL SIGNS: Temp:  [98.4 F (36.9 C)-101.2 F (38.4 C)] 100.5 F (38.1 C) (04/08 0353) Pulse Rate:  [60-75] 65 (04/08 0700) Resp:  [16-38] 16 (04/08 0700) BP: (118-171)/(67-100) 145/78 mmHg (04/08 0700) SpO2:  [92 %-100 %] 97 % (04/08 0700) FiO2 (%):  [39.6 %-100 %] 40.1 % (04/08 0700) Weight:  [196 lb 10.4 oz (89.2 kg)] 196 lb 10.4 oz (89.2 kg) (04/08 0500) HEMODYNAMICS:    Vent Mode:   [-] PRVC FiO2 (%):  [39.6 %-100 %] 40.1 % Set Rate:  [16 bmp] 16 bmp Vt Set:  [700 mL] 700 mL PEEP:  [5 cmH20] 5 cmH20 Plateau Pressure:  [13 cmH20-17 cmH20] 15 cmH20 INTAKE / OUTPUT: Intake/Output     04/07 0701 - 04/08 0700 04/08 0701 - 04/09 0700   I.V. (mL/kg) 1375.3 (15.4)    NG/GT  30   IV Piggyback 662.5    Total Intake(mL/kg) 2037.8 (22.8) 30 (0.3)   Urine (mL/kg/hr) 1280 (0.6)    Emesis/NG output 1200 (0.6)    Total Output 2480     Net -442.2 +30          PHYSICAL EXAMINATION: General: intubated. ,  Neuro:sedated, not following commands, RASS -2 HEENT:  ETT Cardiovascular: S1S2- distant heart sounds, no m/g/r Lungs:  Coarse breath sounds, no wheezes  Abdomen: soft, normoactive bowel sounds, no distension  Musculoskeletal: Intact Skin:  Intact  LABS:  Recent Labs Lab 06/14/12 0500 06/15/12 0400 06/15/12 1234 06/16/12 0430  NA 139 140  --  141  K 3.2* 3.4* 3.4* 3.0*  CL 105 105  --  104  CO2 24 25  --  25  BUN 17 17  --  17  CREATININE 0.58 0.63  --  0.60  GLUCOSE 150* 138*  --  143*    Recent Labs Lab 06/11/12 0543 06/15/12 0400 06/16/12 0430  HGB 15.1 13.5 13.3  HCT 42.5 38.4* 37.7*  WBC 6.2 8.1 7.8  PLT 115* 218 305    Recent Labs Lab 06/09/12 0802 06/10/12 0512 06/11/12 0543 06/13/12 0423  AST 185* 132* 88* 49*  ALT 127* 103* 82* 60*  ALKPHOS 105 102 100 98  BILITOT 0.9 1.8* 1.4* 1.2  PROT 7.3 7.1 7.0 6.8  ALBUMIN 4.4 4.0 4.0 3.4*  INR 0.96  --   --   --       Recent Labs Lab 06/11/12 1645 06/11/12 2251 06/12/12 0445  TROPONINI <0.30 <0.30 <0.30     Recent Labs Lab 06/15/12 1217 06/15/12 1615 06/15/12 2019 06/16/12 0038 06/16/12 0353  GLUCAP 135* 126* 155* 126* 122*    CXR: 4/6 - Minimal bibasilar atelectasis  ASSESSMENT / PLAN:   PULMONARY A: VDRF , due to alcohol withdrawal and encephalopathy P:   - SBT as tolerated, MS will be the limiting factor here - Self extubated and then re- intubated -  4/7   CARDIOVASCULAR A:  History of dilated cardiomyopathy- Likely secondary to alcohol abuse- Chest pain resolved, biomarkers negative, 4-1 Echo showed EF 55-60% Prolonged QTc > 550, patient had been on Methadone which can cause prolonged QTc and torsade.- resolved   P:  -Cont Asprin  -Coreg was switched to IV metoprolol ( 4/7) with increased NG output( PO meds converted to IV) -hold Methadone -Corrected hypokalemia because a low serum potassium concentration enhances the degree of drug-induced inhibition of IKr, increasing the QT interval. (Magnesium - wnl) -Will continue to monitor QTc    RENAL A:  Hypokalemia, K-3.0 - today- repleted  P:   -follow BMP -replete K as needed -FOLEY' S placed 2/2 urinary retention- 4/6 - D5 1/2 NS@ 50cc/hr >> change to [facility]  GASTROINTESTINAL A: Abdominal pain - Elevated LFTs - Likely secondary to alcoholic hepatic disease, LFTs improving, Abd [place] negative except for fatty infiltrate of liver Constipation, high GT output > ? Ileus. NG output is decreasing- 4/8 - Viral hepatitis panel- negative P:   - follow LFT intermittently  - KUB - 4/6: normal bowel gas pattern - TF held 2/2 increased NG output; convert PO meds to IV where necessary   INFECTIOUS A:   HIV negative. He continues to have low grade fevers- which could be 2/2 atelectasis vs UTI. No WBC. Probable LLL HCAP -  Coag neg staph UTI, ? Clinical significance -  - Blood cultures- NGTD - Urine cultures -- 100,000 colonies of coagulase negative staph P:   - Vanc and zosyn started 4/7  HEMATOLOGIC A:  thrombocytopenia, likely due to alcoholic liver disease.  Platelets stable. 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  history of w/d including seizures   P:   - liberalize precedex max dose to 2.5; watch for bradycardia - continue ativan gtt +  precedex, goal wean the ativan first, but do not anticipate that this will be weaned completely off for several days  TODAY'S SUMMARY:  Patient continues to be on ativan and precedex but requiring lesser doses than yesterday. He spiked a fever with Tmax of 101.2, would panculture him.   CC time 40 min  SAWHNEY,MEGHA 06/16/2012 7:41 AM  Levy Pupa, MD, PhD 06/16/2012, 2:38 PM [place] [facility] [phone] or if no answer [phone]

## 2012-06-16 NOTE — Progress Notes (Signed)
[facility] ADULT ICU REPLACEMENT PROTOCOL FOR AM LAB REPLACEMENT ONLY  The patient does apply for the [facility] Adult ICU Electrolyte Replacment Protocol based on the criteria listed below:   1. Is GFR >/= 40 ml/min? yes  Patient's GFR today is >90 2. Is urine output >/= 0.5 ml/kg/hr for the last 6 hours? yes Patient's UOP is .68 ml/kg/hr 3. Is BUN < 60 mg/dL? yes  Patient's BUN today is 17 4. Abnormal electrolyte L 3.0 5. Ordered repletion with: per protocol 6. If a panic level lab has been reported, has the CCM MD in charge been notified? yes.   Physician: Dr Jonni Sanger  [place]elle [place] 06/16/2012 6:02 AM

## 2012-06-17 LAB — BASIC METABOLIC PANEL
CO2: 26 mEq/L (ref 19–32)
Chloride: 101 mEq/L (ref 96–112)
Glucose, Bld: 129 mg/dL — ABNORMAL HIGH (ref 70–99)
Potassium: 3.3 mEq/L — ABNORMAL LOW (ref 3.5–5.1)
Sodium: 139 mEq/L (ref 135–145)

## 2012-06-17 LAB — CBC
Hemoglobin: 12.9 g/dL — ABNORMAL LOW (ref 13.0–17.0)
MCH: 31.6 pg (ref 26.0–34.0)
Platelets: 364 10*3/uL (ref 150–400)
RBC: 4.08 MIL/uL — ABNORMAL LOW (ref 4.22–5.81)
WBC: 9.3 10*3/uL (ref 4.0–10.5)

## 2012-06-17 LAB — GLUCOSE, CAPILLARY
Glucose-Capillary: 122 mg/dL — ABNORMAL HIGH (ref 70–99)
Glucose-Capillary: 130 mg/dL — ABNORMAL HIGH (ref 70–99)
Glucose-Capillary: 140 mg/dL — ABNORMAL HIGH (ref 70–99)
Glucose-Capillary: 142 mg/dL — ABNORMAL HIGH (ref 70–99)
Glucose-Capillary: 144 mg/dL — ABNORMAL HIGH (ref 70–99)
Glucose-Capillary: 160 mg/dL — ABNORMAL HIGH (ref 70–99)

## 2012-06-17 LAB — COMPREHENSIVE METABOLIC PANEL
Albumin: 2.6 g/dL — ABNORMAL LOW (ref 3.5–5.2)
Alkaline Phosphatase: 161 U/L — ABNORMAL HIGH (ref 39–117)
BUN: 12 mg/dL (ref 6–23)
CO2: 26 mEq/L (ref 19–32)
Chloride: 101 mEq/L (ref 96–112)
Creatinine, Ser: 0.66 mg/dL (ref 0.50–1.35)
GFR calc non Af Amer: >90 mL/min (ref >90)
Potassium: 3 mEq/L — ABNORMAL LOW (ref 3.5–5.1)
Total Bilirubin: 0.6 mg/dL (ref 0.3–1.2)

## 2012-06-17 LAB — VANCOMYCIN, TROUGH: Vancomycin Tr: 6.3 ug/mL — ABNORMAL LOW (ref 10.0–20.0)

## 2012-06-17 MED ORDER — JEVITY 1.2 CAL PO LIQD
1000.0000 mL | ORAL | Status: DC
  Administered 2012-06-17 – 2012-06-18 (×3): 1000 mL
  Filled 2012-06-17 (×2): qty 1000

## 2012-06-17 MED ORDER — JEVITY 1.2 CAL PO LIQD
1000.0000 mL | ORAL | Status: DC
  Filled 2012-06-17: qty 1000

## 2012-06-17 MED ORDER — VANCOMYCIN HCL 10 G IV SOLR
1250.0000 mg | Freq: Four times a day (QID) | INTRAVENOUS | Status: DC
  Administered 2012-06-17 – 2012-06-18 (×3): 1250 mg via INTRAVENOUS
  Filled 2012-06-17 (×7): qty 1250

## 2012-06-17 MED ORDER — POTASSIUM CHLORIDE 20 MEQ/15ML (10%) PO LIQD
ORAL | Status: AC
  Filled 2012-06-17: qty 30

## 2012-06-17 MED ORDER — CARVEDILOL 6.25 MG PO TABS
6.2500 mg | ORAL_TABLET | Freq: Two times a day (BID) | ORAL | Status: DC
Start: 2012-06-17 — End: 2012-06-20
  Administered 2012-06-17 – 2012-06-20 (×6): 6.25 mg via ORAL
  Filled 2012-06-17 (×8): qty 1

## 2012-06-17 MED ORDER — POTASSIUM CHLORIDE 20 MEQ/15ML (10%) PO LIQD
40.0000 meq | Freq: Three times a day (TID) | ORAL | Status: DC
Start: 2012-06-17 — End: 2012-06-17
  Administered 2012-06-17: 40 meq via ORAL
  Filled 2012-06-17 (×2): qty 30

## 2012-06-17 MED ORDER — POTASSIUM CHLORIDE 20 MEQ/15ML (10%) PO LIQD
40.0000 meq | Freq: Three times a day (TID) | ORAL | Status: AC
  Administered 2012-06-17 – 2012-06-18 (×3): 40 meq
  Filled 2012-06-17 (×3): qty 30

## 2012-06-17 MED ORDER — POTASSIUM CHLORIDE 10 MEQ/50ML IV SOLN
10.0000 meq | INTRAVENOUS | Status: AC
  Administered 2012-06-17 (×2): 10 meq via INTRAVENOUS
  Filled 2012-06-17: qty 100

## 2012-06-17 NOTE — Progress Notes (Signed)
Wake up assessment performed. Ativan cut in half to 1mg /hr. Precedex was left at as per Dr. Florence Canner. Patient tolerated well for 30 minutes and then respiratory rate increased into the upper 30's and lowwer 40's. Respiratory Therapist placed patient back on full support and Ativan changed back to 2mg /hr. Will continue to assess for vent ween throughout the day.

## 2012-06-17 NOTE — Progress Notes (Signed)
PULMONARY  / CRITICAL CARE MEDICINE  Name: Mark [name] MRN: [id number] DOB: Mar 05, 1966    ADMISSION DATE:  06/09/2012 CONSULTATION DATE:  06/11/12  REFERRING MD :  VANN/TRH  CHIEF COMPLAINT:  ETOH withdraw   BRIEF PATIENT DESCRIPTION: 47 y/o with history of ETOH abuse, HTN, dilated cardiomyopathy, and anxiety admitted from the ED on 4/1 with headache x 1 week, diffuse abdominal pain x 5 days and chronic worsening chest pain.  [name] were elevated but have been trending down.  Abd [place] was negative.  Cardiac biomarkers negative.  Last drink on 4/1 12AM.  Patient has been on CIWA protocol since admission but has become increasingly confused and combative despite treatment.  The patient was not able to be managed on the floor and has been admitted to the MICU for management of withdraw.        SIGNIFICANT EVENTS / STUDIES:  4/1 Echo - Mild LVH. Systolic function normal. The estimated EF 55-60%. 4/3 intubation  4/7: self extubated and re- intubated  LINES / TUBES: 4/3 ETT>>> 4/3 R IJ >>>  CULTURES: 4/3 Urine ctx>>> negative 4/7  Blood culture>>NGTD 4/7 sputum culture>> Normal oropharyngeal flora  ANTIBIOTICS:  4/1 Diflucan 4/3 Vanc 4/7>> Zosyn 4/7>>  Tests/events:  4/7: Patient self extubated and had to be re- intubated.On direct laryngoscopy abundant secretions were noted that appeared like pus and he was started on broad spectrum antibiotics.   Overnight events:  No acute events overnight. Started TF 4/8, advancing   SUBJECTIVE: sedated on vent  VITAL SIGNS: Temp:  [98 F (36.7 C)-99.4 F (37.4 C)] 98.8 F (37.1 C) (04/09 0358) Pulse Rate:  [55-69] 62 (04/09 0700) Resp:  [12-24] 16 (04/09 0700) BP: (131-175)/(79-97) 159/91 mmHg (04/09 0700) SpO2:  [93 %-100 %] 98 % (04/09 0700) FiO2 (%):  [39.8 %-40.7 %] 40.3 % (04/09 0700) Weight:  [195 lb 12.3 oz (88.8 kg)] 195 lb 12.3 oz (88.8 kg) (04/09 0500) HEMODYNAMICS:    Vent Mode:  [-] PRVC FiO2 (%):  [39.8 %-40.7 %]  40.3 % Set Rate:  [16 bmp] 16 bmp Vt Set:  [700 mL] 700 mL PEEP:  [4.6 cmH20-5.7 cmH20] 5.7 cmH20 Pressure Support:  [8 cmH20] 8 cmH20 Plateau Pressure:  [14 cmH20-16 cmH20] 16 cmH20 INTAKE / OUTPUT: Intake/Output     04/08 0701 - 04/09 0700 04/09 0701 - 04/10 0700   I.V. (mL/kg) 1821.2 (20.5)    NG/GT 315    IV Piggyback 962.5    Total Intake(mL/kg) 3098.7 (34.9)    Urine (mL/kg/hr) 935 (0.4)    Emesis/NG output 400 (0.2)    Total Output 1335     Net +1763.7            PHYSICAL EXAMINATION: General: intubated. ,  Neuro:sedated, not following commands, RASS -2 HEENT:  ETT Cardiovascular: S1S2- distant heart sounds, no m/g/r Lungs:  Coarse breath sounds, no wheezes  Abdomen: soft, normoactive bowel sounds, no distension  Musculoskeletal: Intact Skin:  Intact  LABS:  Recent Labs Lab 06/15/12 0400 06/15/12 1234 06/16/12 0430 06/17/12 0440  NA 140  --  141 139  K 3.4* 3.4* 3.0* 3.3*  CL 105  --  104 101  CO2 25  --  25 26  BUN 17  --  17 13  CREATININE 0.63  --  0.60 0.58  GLUCOSE 138*  --  143* 129*    Recent Labs Lab 06/15/12 0400 06/16/12 0430 06/17/12 0440  HGB 13.5 13.3 12.9*  HCT 38.4* 37.7* 36.4*  WBC 8.1 7.8 9.3  PLT 218 305 364    Recent Labs Lab 06/11/12 0543 06/13/12 0423  AST 88* 49*  ALT 82* 60*  ALKPHOS 100 98  BILITOT 1.4* 1.2  PROT 7.0 6.8  ALBUMIN 4.0 3.4*     Recent Labs Lab 06/11/12 1645 06/11/12 2251 06/12/12 0445  TROPONINI <0.30 <0.30 <0.30     Recent Labs Lab 06/16/12 1217 06/16/12 1513 06/16/12 2049 06/17/12 0014 06/17/12 0357  GLUCAP 148* 178* 140* 144* 130*    CXR: 4/8; Increased densities at the left lung base are concerning for pleural fluid and consolidation.   ASSESSMENT / PLAN:   PULMONARY A: VDRF , due to alcohol withdrawal and encephalopathy P:   - SBT as tolerated, MS will be the limiting factor here - Self extubated and then re- intubated - 4/7   CARDIOVASCULAR A:  History of  dilated cardiomyopathy- Likely secondary to alcohol abuse- Chest pain resolved, biomarkers negative, 4-1 Echo showed EF 55-60% Prolonged QTc > 550, patient had been on Methadone which can cause prolonged QTc and torsade.- resolved   P:  -Cont Asprin  -Coreg was switched to IV metoprolol ( 4/7) with increased NG output( PO meds converted to IV) >> change back to coreg 4/9 -hold Methadone -Corrected hypokalemia because a low serum potassium concentration enhances the degree of drug-induced inhibition of IKr, increasing the QT interval. (Magnesium - wnl) -Will continue to monitor QTc    RENAL A:  Hypokalemia, K-3.3 - today- repleted  P:   -follow BMP -replete K as needed -FOLEY' S placed 2/2 urinary retention- 4/6 - D5 1/2 NS@ 50cc/hr - KVO  4/8  GASTROINTESTINAL A: Abdominal pain - Elevated LFTs - Likely secondary to alcoholic hepatic disease, LFTs improving, Abd [place] negative except for fatty infiltrate of liver Constipation, high GT output > ? Ileus. NG output is decreasing significantly- 4/9 - Viral hepatitis panel- negative P:   - follow LFT intermittently  - KUB - 4/6: normal bowel gas pattern - Trickle TF started - 4/8, tolerating well. Would increase the rate as tolerated 4/9    INFECTIOUS A:   HIV negative. He continues to have low grade fevers- which could be 2/2 atelectasis vs UTI. No WBC. Probable LLL HCAP -  Coag neg staph UTI, ? Clinical significance -  - Blood cultures- NGTD - Urine cultures -- 100,000 colonies of coagulase negative staph P:   - Vanc and zosyn started 4/7 - Already on vanc for probable coag negative UTI  HEMATOLOGIC A:  thrombocytopenia, likely due to alcoholic liver disease.  Platelets stable. 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  history of w/d including seizures   P:   - liberalize precedex max  dose to 2.5; watch for bradycardia - continue ativan gtt + precedex, goal wean the ativan first, but do not anticipate that this will be weaned completely off for several days  TODAY'S SUMMARY:  Patient continues to be on ativan and precedex but requiring lesser doses than yesterday.  CC time 40 min  SAWHNEY,MEGHA 06/17/2012 7:31 AM  Levy Pupa, MD, PhD 06/17/2012, 7:31 AM [facility] [phone] or if no answer [phone]

## 2012-06-17 NOTE — Progress Notes (Signed)
[facility] ADULT ICU REPLACEMENT PROTOCOL FOR AM LAB REPLACEMENT ONLY  The patient does apply for the [facility] Adult ICU Electrolyte Replacment Protocol based on the criteria listed below:   1. Is GFR >/= 40 ml/min? yes  Patient's GFR today is >90 2. Is urine output >/= 0.5 ml/kg/hr for the last 6 hours? yes Patient's UOP is 0.65 ml/kg/hr 3. Is BUN < 60 mg/dL? yes  Patient's BUN today is 13 4. Abnormal electrolyte(s): K 3.3 5. Ordered repletion with: see order 6. If a panic level lab has been reported, has the CCM MD in charge been notified? yes.   Physician:  Dr. Maree Krabbe, Justyce Yeater A 06/17/2012 6:06 AM

## 2012-06-17 NOTE — Progress Notes (Signed)
UR Completed.  Caylyn Tedeschi Jane [phone] 06/17/2012  

## 2012-06-17 NOTE — Progress Notes (Signed)
ANTIBIOTIC CONSULT NOTE - FOLLOW UP  Pharmacy Consult for Vancomycin + Zosyn Indication: Empiric HCAP + CoNS UTI coverage  Allergies  Allergen Reactions  . Lisinopril Anaphylaxis and Swelling    Patient Measurements: Height: 6\' 4"  (193 cm) Weight: 195 lb 12.3 oz (88.8 kg) IBW/kg (Calculated) : 86.8  Vital Signs: Temp: 99.2 F (37.3 C) (04/09 1128) Temp src: Oral (04/09 1128) BP: 155/84 mmHg (04/09 1300) Pulse Rate: 57 (04/09 1300) Intake/Output from previous day: 04/08 0701 - 04/09 0700 In: 3098.7 [I.V.:1821.2; NG/GT:315; IV Piggyback:962.5] Out: 1335 [Urine:935; Emesis/NG output:400] Intake/Output from this shift: Total I/O In: 553 [I.V.:278; NG/GT:200; IV Piggyback:75] Out: 715 [Urine:715]  Labs:  Recent Labs  06/15/12 0400 06/16/12 0430 06/17/12 0440 06/17/12 1130  WBC 8.1 7.8 9.3  --   HGB 13.5 13.3 12.9*  --   PLT 218 305 364  --   CREATININE 0.63 0.60 0.58 0.66   Estimated Creatinine Clearance: 141.7 ml/min (by C-G formula based on Cr of 0.66).  Recent Labs  06/17/12 1220  VANCOTROUGH 6.3*     Microbiology: Recent Results (from the past 720 hour(s))  MRSA PCR SCREENING     Status: None   Collection Time    06/11/12 11:53 AM      Result Value Range Status   MRSA by PCR NEGATIVE  NEGATIVE Final   Comment:            The GeneXpert MRSA Assay (FDA     approved for NASAL specimens     only), is one component of a     comprehensive MRSA colonization     surveillance program. It is not     intended to diagnose MRSA     infection nor to guide or     monitor treatment for     MRSA infections.  URINE CULTURE     Status: None   Collection Time    06/11/12  9:52 PM      Result Value Range Status   Specimen Description URINE, CATHETERIZED   Final   Special Requests NONE   Final   Culture  Setup Time 06/12/2012 03:18   Final   Colony Count NO GROWTH   Final   Culture NO GROWTH   Final   Report Status 06/13/2012 FINAL   Final  URINE CULTURE      Status: None   Collection Time    06/14/12  2:00 PM      Result Value Range Status   Specimen Description URINE, RANDOM   Final   Special Requests NONE   Final   Culture  Setup Time 06/14/2012 21:31   Final   Colony Count >=100,000 COLONIES/ML   Final   Culture     Final   Value: STAPHYLOCOCCUS SPECIES (COAGULASE NEGATIVE)     Note: RIFAMPIN AND GENTAMICIN SHOULD NOT BE USED AS SINGLE DRUGS FOR TREATMENT OF STAPH INFECTIONS.   Report Status 06/16/2012 FINAL   Final   Organism ID, Bacteria STAPHYLOCOCCUS SPECIES (COAGULASE NEGATIVE)   Final  CULTURE, BLOOD (ROUTINE X 2)     Status: None   Collection Time    06/15/12  9:30 AM      Result Value Range Status   Specimen Description BLOOD FOREARM LEFT   Final   Special Requests BOTTLES DRAWN AEROBIC AND ANAEROBIC 10CC   Final   Culture  Setup Time 06/15/2012 14:20   Final   Culture     Final   Value:  BLOOD CULTURE RECEIVED NO GROWTH TO DATE CULTURE WILL BE HELD FOR 5 DAYS BEFORE ISSUING A FINAL NEGATIVE REPORT   Report Status PENDING   Incomplete  CULTURE, BLOOD (ROUTINE X 2)     Status: None   Collection Time    06/15/12  9:35 AM      Result Value Range Status   Specimen Description BLOOD RIGHT ANTECUBITAL   Final   Special Requests BOTTLES DRAWN AEROBIC AND ANAEROBIC 10CC   Final   Culture  Setup Time 06/15/2012 14:21   Final   Culture     Final   Value:        BLOOD CULTURE RECEIVED NO GROWTH TO DATE CULTURE WILL BE HELD FOR 5 DAYS BEFORE ISSUING A FINAL NEGATIVE REPORT   Report Status PENDING   Incomplete  CULTURE, RESPIRATORY (NON-EXPECTORATED)     Status: None   Collection Time    06/15/12 11:30 AM      Result Value Range Status   Specimen Description TRACHEAL ASPIRATE   Final   Special Requests Normal   Final   Gram Stain     Final   Value: ABUNDANT WBC PRESENT, PREDOMINANTLY PMN     FEW SQUAMOUS EPITHELIAL CELLS PRESENT     FEW GRAM POSITIVE COCCI     IN PAIRS FEW GRAM POSITIVE RODS     RARE GRAM NEGATIVE RODS    Culture Culture reincubated for better growth   Final   Report Status PENDING   Incomplete    Anti-infectives   Start     Dose/Rate Route Frequency Ordered Stop   06/15/12 2100  vancomycin (VANCOCIN) IVPB 1000 mg/200 mL premix     1,000 mg 200 mL/hr over 60 Minutes Intravenous Every 8 hours 06/15/12 2008     06/15/12 2030  piperacillin-tazobactam (ZOSYN) IVPB 3.375 g     3.375 g 12.5 mL/hr over 240 Minutes Intravenous 3 times per day 06/15/12 2005     06/09/12 1230  fluconazole (DIFLUCAN) tablet 100 mg  Status:  Discontinued     100 mg Oral Daily 06/09/12 1222 06/11/12 1151      Assessment: 47 y.o. M on Vancomycin + Zosyn for empiric HCAP and CoNS UTI coverage with a SUBtherapeutic Vancomycin level this afternoon (VT~6.3, goal of 15-20). SCr 0.66, CrCl>100 ml/min. Will increase dose conservatively for now and will f/u with an additional trough at steady state. Will also increase infusion time to ~2 hours (instead of 1.5 hours) to help get level to goal.  Vanc 4/7 >> Zosyn 4/7 >>  4/6 UCx >> 100k CoNS (pan-S except R-PCN) 4/7 BCx >> ngtd 4/7 RCx >> GNR, GPC/R on GS 4/3 UCx >> NG  Goal of Therapy:  Vancomycin trough level 15-20 mcg/ml  Plan:  1. Increase Vancomycin to 1250 mg IV every 6 hours (infused over 2 hours) 2. Will obtain Vancomycin trough prior to 1400 dose due on 4/10.  3. Continue Zosyn 3.375g IV every 8 hours 4. Will continue to follow renal function, culture results, LOT, and antibiotic de-escalation plans   Georgina Pillion, PharmD, BCPS Clinical Pharmacist Pager: [phone] 06/17/2012 2:08 PM

## 2012-06-18 LAB — GLUCOSE, CAPILLARY
Glucose-Capillary: 142 mg/dL — ABNORMAL HIGH (ref 70–99)
Glucose-Capillary: 165 mg/dL — ABNORMAL HIGH (ref 70–99)

## 2012-06-18 LAB — CBC
HCT: 37.6 % — ABNORMAL LOW (ref 39.0–52.0)
MCV: 90.4 fL (ref 78.0–100.0)
Platelets: 428 10*3/uL — ABNORMAL HIGH (ref 150–400)
RBC: 4.16 MIL/uL — ABNORMAL LOW (ref 4.22–5.81)
WBC: 10.2 10*3/uL (ref 4.0–10.5)

## 2012-06-18 LAB — CULTURE, RESPIRATORY W GRAM STAIN: Special Requests: NORMAL

## 2012-06-18 LAB — BASIC METABOLIC PANEL
BUN: 12 mg/dL (ref 6–23)
CO2: 26 mEq/L (ref 19–32)
Chloride: 103 mEq/L (ref 96–112)
Creatinine, Ser: 0.62 mg/dL (ref 0.50–1.35)
GFR calc Af Amer: >90 mL/min (ref >90)
Potassium: 4.1 mEq/L (ref 3.5–5.1)

## 2012-06-18 MED ORDER — VANCOMYCIN HCL 10 G IV SOLR
1250.0000 mg | Freq: Three times a day (TID) | INTRAVENOUS | Status: DC
  Administered 2012-06-18 – 2012-06-20 (×4): 1250 mg via INTRAVENOUS
  Filled 2012-06-18 (×8): qty 1250

## 2012-06-18 MED ORDER — SODIUM CHLORIDE 0.9 % IV SOLN
INTRAVENOUS | Status: DC
  Administered 2012-06-18: 15:00:00 via INTRAVENOUS
  Administered 2012-06-19: 250 mL via INTRAVENOUS
  Administered 2012-06-22: 20 mL/h via INTRAVENOUS
  Administered 2012-06-23 (×2): via INTRAVENOUS

## 2012-06-18 MED ORDER — CLONIDINE HCL 0.1 MG/24HR TD PTWK
0.1000 mg | MEDICATED_PATCH | TRANSDERMAL | Status: DC
  Administered 2012-06-18: 0.1 mg via TRANSDERMAL
  Filled 2012-06-18 (×2): qty 1

## 2012-06-18 MED ORDER — HYDRALAZINE HCL 20 MG/ML IJ SOLN
10.0000 mg | INTRAMUSCULAR | Status: DC | PRN
  Administered 2012-06-18: 10 mg via INTRAVENOUS
  Filled 2012-06-18: qty 1

## 2012-06-18 MED ORDER — LORAZEPAM 2 MG/ML IJ SOLN
2.0000 mg | INTRAMUSCULAR | Status: DC | PRN
  Administered 2012-06-18 (×3): 2 mg via INTRAVENOUS
  Filled 2012-06-18 (×4): qty 1

## 2012-06-18 MED ORDER — JEVITY 1.2 CAL PO LIQD
1000.0000 mL | ORAL | Status: DC
  Administered 2012-06-19: 1000 mL
  Filled 2012-06-18: qty 1000

## 2012-06-18 NOTE — Progress Notes (Signed)
PULMONARY  / CRITICAL CARE MEDICINE  Name: Mark [name] MRN: [id number] DOB: January 18, 1966    ADMISSION DATE:  06/09/2012 CONSULTATION DATE:  06/11/12  REFERRING MD :  VANN/TRH  CHIEF COMPLAINT:  ETOH withdraw   BRIEF PATIENT DESCRIPTION: 47 y/o with history of ETOH abuse, HTN, dilated cardiomyopathy, and anxiety admitted from the ED on 4/1 with headache x 1 week, diffuse abdominal pain x 5 days and chronic worsening chest pain.  [name] were elevated but have been trending down.  Abd [place] was negative.  Cardiac biomarkers negative.  Last drink on 4/1 12AM.  Patient has been on CIWA protocol since admission but has become increasingly confused and combative despite treatment.  The patient was not able to be managed on the floor and has been admitted to the MICU for management of withdraw.        SIGNIFICANT EVENTS / STUDIES:  4/1 Echo - Mild LVH. Systolic function normal. The estimated EF 55-60%. 4/3 intubation  4/7: self extubated and re- intubated  LINES / TUBES: 4/3 ETT>>> 4/3 R IJ >>>  CULTURES: 4/3 Urine ctx>>> negative 4/6 Urine ctx>> coagulase negative staph aureus 4/7  Blood culture>>NGTD 4/7 sputum culture>> Normal oropharyngeal flora  ANTIBIOTICS:  4/1 Diflucan 4/3 Vanc 4/7>> Zosyn 4/7>>  Tests/events:  LFT's 4/10: show  Elevated ALP ( 98>161) Ultrasound 4/1: Fatty changes in the liver. Otherwise unremarkable. KUB - 4/6: normal bowel gas pattern   Overnight events:  No acute events overnight. Started TF 4/8, advancing - tolerating well  Ativan to off today (on precedex)  SUBJECTIVE: sedated on vent  VITAL SIGNS: Temp:  [98.2 F (36.8 C)-99.4 F (37.4 C)] 99.4 F (37.4 C) (04/10 0406) Pulse Rate:  [53-72] 69 (04/10 0400) Resp:  [14-31] 17 (04/10 0400) BP: (124-177)/(72-97) 177/94 mmHg (04/10 0400) SpO2:  [95 %-100 %] 97 % (04/10 0400) FiO2 (%):  [39.4 %-40.9 %] 40.1 % (04/10 0400) Weight:  [201 lb 4.5 oz (91.3 kg)] 201 lb 4.5 oz (91.3 kg) (04/10  0439) HEMODYNAMICS:    Vent Mode:  [-] PRVC FiO2 (%):  [39.4 %-40.9 %] 40.1 % Set Rate:  [16 bmp] 16 bmp Vt Set:  [700 mL] 700 mL PEEP:  [4.7 cmH20-5.2 cmH20] 5 cmH20 Pressure Support:  [5 cmH20] 5 cmH20 Plateau Pressure:  [15 cmH20-16 cmH20] 15 cmH20 INTAKE / OUTPUT: Intake/Output     04/09 0701 - 04/10 0700 04/10 0701 - 04/11 0700   I.V. (mL/kg) 1162.8 (12.7)    NG/GT 930    IV Piggyback 875    Total Intake(mL/kg) 2967.8 (32.5)    Urine (mL/kg/hr) 2180 (1)    Emesis/NG output     Total Output 2180     Net +787.8            PHYSICAL EXAMINATION: General: intubated. ,  Neuro:sedated, not following commands, RASS -2 HEENT:  ETT Cardiovascular: S1S2- distant heart sounds, no m/g/r Lungs:  Coarse breath sounds, no wheezes  Abdomen: soft, normoactive bowel sounds, no distension  Musculoskeletal: Intact Skin:  Intact  LABS:  Recent Labs Lab 06/17/12 0440 06/17/12 1130 06/18/12 0400  NA 139 139 138  K 3.3* 3.0* 4.1  CL 101 101 103  CO2 26 26 26   BUN 13 12 12   CREATININE 0.58 0.66 0.62  GLUCOSE 129* 138* 163*    Recent Labs Lab 06/16/12 0430 06/17/12 0440 06/18/12 0400  HGB 13.3 12.9* 13.6  HCT 37.7* 36.4* 37.6*  WBC 7.8 9.3 10.2  PLT 305 364 428*  Recent Labs Lab 06/13/12 0423 06/17/12 1130  AST 49* 47*  ALT 60* 44  ALKPHOS 98 161*  BILITOT 1.2 0.6  PROT 6.8 6.8  ALBUMIN 3.4* 2.6*     Recent Labs Lab 06/11/12 1645 06/11/12 2251 06/12/12 0445  TROPONINI <0.30 <0.30 <0.30     Recent Labs Lab 06/17/12 1127 06/17/12 1529 06/17/12 1923 06/18/12 0017 06/18/12 0405  GLUCAP 142* 160* 122* 142* 164*    CXR: 4/8; Increased densities at the left lung base are concerning for pleural fluid and consolidation.   ASSESSMENT / PLAN:   PULMONARY A: VDRF , due to alcohol withdrawal and encephalopathy Self extubated and then re- intubated - 4/7 P:   - SBT as tolerated, MS will be the limiting factor here    CARDIOVASCULAR A:   History of dilated cardiomyopathy- Likely secondary to alcohol abuse- Chest pain resolved, biomarkers negative, 4-1 Echo showed EF 55-60% Prolonged QTc > 550, patient had been on Methadone which can cause prolonged QTc and torsade.- resolved   P:  -Cont Asprin  -added clonidine patch 4/10 -BP elevated . Coreg PO was restarted at 6.25 BID - 4/9 (metoprolol was d/c - 4/9). With the bradycardia from precedex, incrementing the coreg dose is limited, so would add PRN hydralazine for SBP> 180 mm -hold Methadone -Corrected hypokalemia because a low serum potassium concentration enhances the degree of drug-induced inhibition of IKr, increasing the QT interval. (Magnesium - wnl) -Will continue to monitor QTc    RENAL A:  Hypokalemia, K-3.3 - today- repleted P:   -follow BMP -replete K as needed -FOLEY' S placed 2/2 urinary retention- 4/6 - D5 1/2 NS@ KVO  4/8  GASTROINTESTINAL A: Abdominal pain - Elevated LFTs - Likely secondary to alcoholic hepatic disease, Abd [place] negative except for fatty infiltrate of liver. His ALP is trending up. 98>161 Viral hepatitis panel- negative. KUB - 4/6: normal bowel gas pattern P:   - follow ALP . May consider re- imaging if ALP continues to trend up .  -Tolerating TF well   INFECTIOUS A:   HIV negative. He continues to have low grade fevers- which could be 2/2 atelectasis vs UTI. No WBC. Probable LLL HCAP -  Coag neg staph UTI, ? Clinical significance -  - Blood cultures- NGTD - Urine cultures -- 100,000 colonies of coagulase negative staph P:   - Vanc and zosyn started 4/7 - Already on vanc for probable coag negative UTI - remove foley 4/10 - CXR 4/11  HEMATOLOGIC A:  thrombocytopenia, likely due to alcoholic liver disease.  Platelets stable. 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  history of w/d  including seizures   P:   - liberalized precedex max dose to 2.5; watch for bradycardia - ativan gtt to off, will use ativan pushes prn  TODAY'S SUMMARY:  Patient continues to be on precedex, MS clearing some. Ativan to prn pushes  CC time 30 min  SAWHNEY,MEGHA 06/18/2012 7:32 AM  Levy Pupa, MD, PhD 06/18/2012, 7:32 AM [facility] [phone] or if no answer [phone]

## 2012-06-18 NOTE — Progress Notes (Signed)
ANTIBIOTIC CONSULT NOTE - FOLLOW UP  Pharmacy Consult for Vancomycin Indication: Empiric HCAP + CoNS UTI coverage  Allergies  Allergen Reactions  . Lisinopril Anaphylaxis and Swelling    Patient Measurements: Height: 6\' 4"  (193 cm) Weight: 201 lb 4.5 oz (91.3 kg) IBW/kg (Calculated) : 86.8  Vital Signs: Temp: 98.7 F (37.1 C) (04/10 1509) Temp src: Oral (04/10 1509) BP: 137/70 mmHg (04/10 1500) Pulse Rate: 80 (04/10 1500) Intake/Output from previous day: 04/09 0701 - 04/10 0700 In: 3370.9 [I.V.:1335.9; NG/GT:1110; IV Piggyback:925] Out: 2180 [Urine:2180] Intake/Output from this shift: Total I/O In: 1107.3 [I.V.:417.3; NG/GT:440; IV Piggyback:250] Out: 1015 [Urine:1015]  Labs:  Recent Labs  06/16/12 0430 06/17/12 0440 06/17/12 1130 06/18/12 0400  WBC 7.8 9.3  --  10.2  HGB 13.3 12.9*  --  13.6  PLT 305 364  --  428*  CREATININE 0.60 0.58 0.66 0.62   Estimated Creatinine Clearance: 141.7 ml/min (by C-G formula based on Cr of 0.62).  Recent Labs  06/17/12 1220 06/18/12 1330  VANCOTROUGH 6.3* 22.5*     Microbiology: Recent Results (from the past 720 hour(s))  MRSA PCR SCREENING     Status: None   Collection Time    06/11/12 11:53 AM      Result Value Range Status   MRSA by PCR NEGATIVE  NEGATIVE Final   Comment:            The GeneXpert MRSA Assay (FDA     approved for NASAL specimens     only), is one component of a     comprehensive MRSA colonization     surveillance program. It is not     intended to diagnose MRSA     infection nor to guide or     monitor treatment for     MRSA infections.  URINE CULTURE     Status: None   Collection Time    06/11/12  9:52 PM      Result Value Range Status   Specimen Description URINE, CATHETERIZED   Final   Special Requests NONE   Final   Culture  Setup Time 06/12/2012 03:18   Final   Colony Count NO GROWTH   Final   Culture NO GROWTH   Final   Report Status 06/13/2012 FINAL   Final  URINE CULTURE      Status: None   Collection Time    06/14/12  2:00 PM      Result Value Range Status   Specimen Description URINE, RANDOM   Final   Special Requests NONE   Final   Culture  Setup Time 06/14/2012 21:31   Final   Colony Count >=100,000 COLONIES/ML   Final   Culture     Final   Value: STAPHYLOCOCCUS SPECIES (COAGULASE NEGATIVE)     Note: RIFAMPIN AND GENTAMICIN SHOULD NOT BE USED AS SINGLE DRUGS FOR TREATMENT OF STAPH INFECTIONS.   Report Status 06/16/2012 FINAL   Final   Organism ID, Bacteria STAPHYLOCOCCUS SPECIES (COAGULASE NEGATIVE)   Final  CULTURE, BLOOD (ROUTINE X 2)     Status: None   Collection Time    06/15/12  9:30 AM      Result Value Range Status   Specimen Description BLOOD FOREARM LEFT   Final   Special Requests BOTTLES DRAWN AEROBIC AND ANAEROBIC 10CC   Final   Culture  Setup Time 06/15/2012 14:20   Final   Culture     Final   Value:  BLOOD CULTURE RECEIVED NO GROWTH TO DATE CULTURE WILL BE HELD FOR 5 DAYS BEFORE ISSUING A FINAL NEGATIVE REPORT   Report Status PENDING   Incomplete  CULTURE, BLOOD (ROUTINE X 2)     Status: None   Collection Time    06/15/12  9:35 AM      Result Value Range Status   Specimen Description BLOOD RIGHT ANTECUBITAL   Final   Special Requests BOTTLES DRAWN AEROBIC AND ANAEROBIC 10CC   Final   Culture  Setup Time 06/15/2012 14:21   Final   Culture     Final   Value:        BLOOD CULTURE RECEIVED NO GROWTH TO DATE CULTURE WILL BE HELD FOR 5 DAYS BEFORE ISSUING A FINAL NEGATIVE REPORT   Report Status PENDING   Incomplete  CULTURE, RESPIRATORY (NON-EXPECTORATED)     Status: None   Collection Time    06/15/12 11:30 AM      Result Value Range Status   Specimen Description TRACHEAL ASPIRATE   Final   Special Requests Normal   Final   Gram Stain     Final   Value: ABUNDANT WBC PRESENT, PREDOMINANTLY PMN     FEW SQUAMOUS EPITHELIAL CELLS PRESENT     FEW GRAM POSITIVE COCCI     IN PAIRS FEW GRAM POSITIVE RODS     RARE GRAM NEGATIVE RODS    Culture Non-Pathogenic Oropharyngeal-type Flora Isolated.   Final   Report Status 06/18/2012 FINAL   Final    Anti-infectives   Start     Dose/Rate Route Frequency Ordered Stop   06/18/12 1700  vancomycin (VANCOCIN) 1,250 mg in sodium chloride 0.9 % 250 mL IVPB     1,250 mg 166.7 mL/hr over 90 Minutes Intravenous Every 8 hours 06/18/12 1512     06/17/12 2000  vancomycin (VANCOCIN) 1,250 mg in sodium chloride 0.9 % 250 mL IVPB  Status:  Discontinued     1,250 mg 125 mL/hr over 120 Minutes Intravenous Every 6 hours 06/17/12 1423 06/18/12 1437   06/15/12 2100  vancomycin (VANCOCIN) IVPB 1000 mg/200 mL premix  Status:  Discontinued     1,000 mg 200 mL/hr over 60 Minutes Intravenous Every 8 hours 06/15/12 2008 06/17/12 1423   06/15/12 2030  piperacillin-tazobactam (ZOSYN) IVPB 3.375 g     3.375 g 12.5 mL/hr over 240 Minutes Intravenous 3 times per day 06/15/12 2005     06/09/12 1230  fluconazole (DIFLUCAN) tablet 100 mg  Status:  Discontinued     100 mg Oral Daily 06/09/12 1222 06/11/12 1151      Assessment: 47 y.o. M on Vancomycin + Zosyn for empiric HCAP and CoNS UTI coverage with a supratherapeutic Vancomycin level this afternoon of 22.5.   Vanc 4/7 >> Zosyn 4/7 >>  4/6 UCx >> 100k CoNS (pan-S except R-PCN) 4/7 BCx >> ngtd 4/7 RCx >> normal flora 4/3 UCx >> NG  Goal of Therapy:  Vancomycin trough level 15-20 mcg/ml  Plan:  1. Change vancomycin to 1250mg  IV Q8H 2. F/u renal fxn, C&S, clinical status and trough at SS 3. F/u LOT  Lysle Pearl, PharmD, BCPS Pager # [phone] 06/18/2012 3:14 PM

## 2012-06-18 NOTE — Progress Notes (Signed)
Pt. Extremely agitated at this time. RT unable to perform CPT. Will reassess pt. Next round.

## 2012-06-19 ENCOUNTER — Inpatient Hospital Stay (HOSPITAL_COMMUNITY): Payer: MEDICAID

## 2012-06-19 LAB — GLUCOSE, CAPILLARY
Glucose-Capillary: 147 mg/dL — ABNORMAL HIGH (ref 70–99)
Glucose-Capillary: 153 mg/dL — ABNORMAL HIGH (ref 70–99)
Glucose-Capillary: 140 mg/dL — ABNORMAL HIGH (ref 70–99)

## 2012-06-19 LAB — BASIC METABOLIC PANEL
BUN: 12 mg/dL (ref 6–23)
CO2: 28 mEq/L (ref 19–32)
Calcium: 9.5 mg/dL (ref 8.4–10.5)
Chloride: 103 mEq/L (ref 96–112)
Creatinine, Ser: 0.65 mg/dL (ref 0.50–1.35)
GFR calc Af Amer: >90 mL/min (ref >90)

## 2012-06-19 LAB — CBC
HCT: 36.9 % — ABNORMAL LOW (ref 39.0–52.0)
MCH: 32.4 pg (ref 26.0–34.0)
MCV: 90.4 fL (ref 78.0–100.0)
Platelets: 490 10*3/uL — ABNORMAL HIGH (ref 150–400)
RDW: 14.2 % (ref 11.5–15.5)

## 2012-06-19 LAB — MAGNESIUM: Magnesium: 2.2 mg/dL (ref 1.5–2.5)

## 2012-06-19 MED ORDER — JEVITY 1.2 CAL PO LIQD
1000.0000 mL | ORAL | Status: DC
  Administered 2012-06-20: 1000 mL
  Filled 2012-06-19 (×5): qty 1000

## 2012-06-19 MED ORDER — POTASSIUM CHLORIDE 20 MEQ/15ML (10%) PO LIQD
ORAL | Status: AC
  Filled 2012-06-19: qty 30

## 2012-06-19 MED ORDER — POTASSIUM CHLORIDE 20 MEQ/15ML (10%) PO LIQD
40.0000 meq | Freq: Once | ORAL | Status: AC
  Administered 2012-06-19: 40 meq
  Filled 2012-06-19: qty 30

## 2012-06-19 MED ORDER — POTASSIUM CHLORIDE 10 MEQ/50ML IV SOLN
10.0000 meq | INTRAVENOUS | Status: AC
  Administered 2012-06-19 (×4): 10 meq via INTRAVENOUS
  Filled 2012-06-19: qty 200

## 2012-06-19 NOTE — Progress Notes (Signed)
PULMONARY  / CRITICAL CARE MEDICINE  Name: Mark [name] MRN: [id number] DOB: 1966-01-21    ADMISSION DATE:  06/09/2012 CONSULTATION DATE:  06/11/12  REFERRING MD :  VANN/TRH  CHIEF COMPLAINT:  ETOH withdraw   BRIEF PATIENT DESCRIPTION: 47 y/o with history of ETOH abuse, HTN, dilated cardiomyopathy, and anxiety admitted from the ED on 4/1 with headache x 1 week, diffuse abdominal pain x 5 days and chronic worsening chest pain.  [name] were elevated but have been trending down.  Abd [place] was negative.  Cardiac biomarkers negative.  Last drink on 4/1 12AM.  Patient has been on CIWA protocol since admission but has become increasingly confused and combative despite treatment.  The patient was not able to be managed on the floor and has been admitted to the MICU for management of withdraw.        SIGNIFICANT EVENTS / STUDIES:  4/1 Echo - Mild LVH. Systolic function normal. The estimated EF 55-60%. 4/3 intubation  4/7: self extubated and re- intubated  LINES / TUBES: 4/3 ETT>>> 4/3 R IJ >>>  CULTURES: 4/3 Urine ctx>>> negative 4/6 Urine ctx>> coagulase negative staph aureus 4/7  Blood culture>>NGTD 4/7 sputum culture>> Normal oropharyngeal flora  ANTIBIOTICS:  4/1 Diflucan 4/3 Vanc 4/7>> Zosyn 4/7>>  Tests/events:  LFT's 4/10: show  Elevated ALP ( 98>161) Ultrasound 4/1: Fatty changes in the liver. Otherwise unremarkable. KUB - 4/6: normal bowel gas pattern   Overnight events:  No acute events overnight. Started TF 4/8, advancing - tolerating well  Ativan to off today (on precedex)- 4/11  SUBJECTIVE: sedated on vent  VITAL SIGNS: Temp:  [98.2 F (36.8 C)-99.4 F (37.4 C)] 98.2 F (36.8 C) (04/11 0353) Pulse Rate:  [53-82] 62 (04/11 0735) Resp:  [14-33] 16 (04/11 0735) BP: (102-185)/(59-135) 133/66 mmHg (04/11 0735) SpO2:  [91 %-100 %] 100 % (04/11 0735) FiO2 (%):  [39.3 %-40.9 %] 40 % (04/11 0735) Weight:  [201 lb 1 oz (91.2 kg)] 201 lb 1 oz (91.2 kg) (04/11  0500) HEMODYNAMICS:    Vent Mode:  [-] CPAP FiO2 (%):  [39.3 %-40.9 %] 40 % Set Rate:  [16 bmp] 16 bmp Vt Set:  [600 mL-700 mL] 600 mL PEEP:  [5 cmH20] 5 cmH20 Pressure Support:  [5 cmH20-16 cmH20] 5 cmH20 Plateau Pressure:  [11 cmH20-24 cmH20] 15 cmH20 INTAKE / OUTPUT: Intake/Output     04/10 0701 - 04/11 0700 04/11 0701 - 04/12 0700   I.V. (mL/kg) 1521.3 (16.7)    NG/GT 1120 60   IV Piggyback 650    Total Intake(mL/kg) 3291.3 (36.1) 60 (0.7)   Urine (mL/kg/hr) 2650 (1.2)    Total Output 2650     Net +641.3 +60          PHYSICAL EXAMINATION: General: intubated. ,  Neuro:sedated, not following commands, RASS -2 HEENT:  ETT Cardiovascular: S1S2- distant heart sounds, no m/g/r Lungs:  Coarse breath sounds, no wheezes  Abdomen: soft, normoactive bowel sounds, no distension  Musculoskeletal: Intact Skin:  Intact  LABS:  Recent Labs Lab 06/17/12 1130 06/18/12 0400 06/19/12 0400  NA 139 138 139  K 3.0* 4.1 3.1*  CL 101 103 103  CO2 26 26 28   BUN 12 12 12   CREATININE 0.66 0.62 0.65  GLUCOSE 138* 163* 181*    Recent Labs Lab 06/17/12 0440 06/18/12 0400 06/19/12 0400  HGB 12.9* 13.6 13.2  HCT 36.4* 37.6* 36.9*  WBC 9.3 10.2 12.4*  PLT 364 428* 490*    Recent  Labs Lab 06/13/12 0423 06/17/12 1130  AST 49* 47*  ALT 60* 44  ALKPHOS 98 161*  BILITOT 1.2 0.6  PROT 6.8 6.8  ALBUMIN 3.4* 2.6*      Recent Labs Lab 06/18/12 0802 06/18/12 1224 06/18/12 1508 06/18/12 2028 06/19/12 0039  GLUCAP 143* 147* 130* 165* 145*    CXR: 4/11; 2. Improved aeration of the lungs with persistent bilateral medial  basilar airspace opacities, atelectasis versus infiltrate.     ASSESSMENT / PLAN:   PULMONARY A: VDRF , due to alcohol withdrawal and encephalopathy Self extubated and then re- intubated - 4/7 P:   - SBT as tolerated, MS will be the limiting factor here    CARDIOVASCULAR A:  History of dilated cardiomyopathy- Likely secondary to alcohol  abuse- Chest pain resolved, biomarkers negative, 4-1 Echo showed EF 55-60% Prolonged QTc > 550, patient had been on Methadone which can cause prolonged QTc and torsade.- resolved   P:  -Cont Asprin  -BP elevated . Coreg PO was restarted at 6.25 BID - 4/9 (metoprolol was d/c - 4/9). With the bradycardia from precedex, incrementing the coreg dose is limited, so would add PRN hydralazine for SBP> 180 mm. On clonidine patch 0.1 mg q weekly.  -hold Methadone -Corrected hypokalemia because a low serum potassium concentration enhances the degree of drug-induced inhibition of IKr, increasing the QT interval. (Magnesium - wnl) -Will continue to monitor QTc    RENAL A:  Hypokalemia, K-3.1 - today- repleted P:   -follow BMP -replete K as needed -His Foley's was attempted to be d/c yesterday ( 4/10) but still had problems with retention requiring I and O cath. Improving this Am- 4/11. Continue condom catheter.  - NSL KVO  4/10  GASTROINTESTINAL A: Abdominal pain - Elevated LFTs - Likely secondary to alcoholic hepatic disease, Abd [place] negative except for fatty infiltrate of liver. His ALP is trending up. 98>161 Viral hepatitis panel- negative. KUB - 4/6: normal bowel gas pattern P:   - follow ALP . May consider re- imaging if ALP continues to trend up .  -Tolerating TF well   INFECTIOUS A:   HIV negative. He continues to have low grade fevers- which could be 2/2 atelectasis vs UTI. No WBC. Probable LLL HCAP - Leucocytosis- WBC is trending up 10>12.4( 4/11) Coag neg staph UTI, ? Clinical significance -?  - Blood cultures- NGTD - Urine cultures -- 100,000 colonies of coagulase negative staph  P:   - Vanc and zosyn started 4/7, day # 5 of 7 abx (on 4/11) - Already on vanc for probable coag negative UTI - Monitor WBC    HEMATOLOGIC A:  thrombocytopenia, likely due to alcoholic liver disease.  Platelets stable. 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  history of w/d including seizures   P:   - liberalized precedex max dose to 2.5; watch for bradycardia - Wean ativan drip to off and use ativan pushes prn  TODAY'S SUMMARY: Patient continues to be on precedex, MS clearing some. Ativan to prn pushes  CC time 30 min  SAWHNEY,MEGHA 06/19/2012 8:13 AM  Levy Pupa, MD, PhD 06/19/2012, 8:13 AM [facility] [phone] or if no answer [phone]

## 2012-06-19 NOTE — Progress Notes (Signed)
NUTRITION FOLLOW UP  Intervention:    Continue to increase Jevity 1.2 by 10 ml every 4 hours to goal rate 75 ml/h with Prostat 30 ml BID to provide 2360 kcal (104% of needs), 130 gm protein (>100% of needs), 1458 ml free water daily.  Nutrition Dx:   Inadequate oral intake related to inability to eat as evidenced by NPO status. Ongoing.  Goal:  Intake to meet >90% of estimated nutrition needs. met.  Monitor:  TF initiation/tolerance/adequacy, weight trend, labs, vent status.   Assessment:   Patient remains intubated on ventilator support. Pt intubated due to alcohol withdrawal and encephalopathy. Mental status inhibiting weaning.  MV: 14.5 Temp:Temp (24hrs), Avg:98.9 F (37.2 C), Min:98.2 F (36.8 C), Max:99.4 F (37.4 C)  Pt with low potassium which is being repleted with supplemental IV potassium.   Patient has OG tube in place. Jevity 1.2 is infusing @ 65 ml/hr. 30 ml Prostat via tube BID. Tube feeding regimen currently providing 2072 kcal, 116 grams protein, and 1263 ml H2O.  Per RN pt tolerating TF well.   Residuals: 100 ml   Height: Ht Readings from Last 1 Encounters:  06/11/12 6\' 4"  (1.93 m)    Weight Status:   Wt Readings from Last 1 Encounters:  06/19/12 201 lb 1 oz (91.2 kg)  06/11/12  202 lb 13.2 oz (92 kg)  Weight down with negative fluid balance.  Re-estimated needs:  Kcal: 2270 Protein: 125-150 gm Fluid: 2.3-2.5 L  Skin: no problems noted  Diet Order: NPO   Intake/Output Summary (Last 24 hours) at 06/19/12 1210 Last data filed at 06/19/12 1100  Gross per 24 hour  Intake 3025.02 ml  Output   2280 ml  Net 745.02 ml    Last BM: 4/10   Labs:   Recent Labs Lab 06/15/12 0400  06/17/12 1130 06/18/12 0400 06/19/12 0400  NA 140  < > 139 138 139  K 3.4*  < > 3.0* 4.1 3.1*  CL 105  < > 101 103 103  CO2 25  < > 26 26 28   BUN 17  < > 12 12 12   CREATININE 0.63  < > 0.66 0.62 0.65  CALCIUM 9.5  < > 9.2 9.5 9.5  MG 2.5  --  2.1  --  2.2   PHOS  --   --   --   --  4.5  GLUCOSE 138*  < > 138* 163* 181*  < > = values in this interval not displayed.  CBG (last 3)   Recent Labs  06/19/12 0039 06/19/12 0349 06/19/12 0804  GLUCAP 145* 147* 146*    Scheduled Meds: . antiseptic oral rinse  15 mL Mouth Rinse QID  . carvedilol  6.25 mg Oral BID WC  . chlorhexidine  15 mL Mouth Rinse BID  . cloNIDine  0.1 mg Transdermal Weekly  . docusate  100 mg Per Tube Daily  . feeding supplement (JEVITY 1.2 CAL)  1,000 mL Per Tube Q24H  . feeding supplement  30 mL Per Tube BID  . heparin  5,000 Units Subcutaneous Q8H  . insulin aspart  0-9 Units Subcutaneous Q4H  . nicotine  14 mg Transdermal Daily  . pantoprazole (PROTONIX) IV  40 mg Intravenous Q12H  . piperacillin-tazobactam (ZOSYN)  IV  3.375 g Intravenous Q8H  . potassium chloride  10 mEq Intravenous Q1 Hr x 4  . vancomycin  1,250 mg Intravenous Q8H    Continuous Infusions: . sodium chloride 250 mL (06/19/12 0947)  .  dexmedetomidine 2.2 mcg/kg/hr (06/19/12 1204)  . LORazepam (ATIVAN) infusion Stopped (06/19/12 0836)    Kendell Bane RD, LDN, CNSC [phone] Pager [phone] After Hours Pager

## 2012-06-19 NOTE — Progress Notes (Signed)
[facility] ADULT ICU REPLACEMENT PROTOCOL FOR AM LAB REPLACEMENT ONLY  The patient does not: apply for the [facility] Adult ICU Electrolyte Replacment Protocol based on the criteria listed below:    2. Is urine output >/= 0.5 ml/kg/hr for the last 6 hours? no   4. Abnormal electrolyte(s): K 3.1, phos 1.4  6. If a panic level lab has been reported, has the CCM MD in charge been notified? yes.   Physician:  Dr. Higinio Plan, Holton Sidman A 06/19/2012 6:15 AM

## 2012-06-19 NOTE — Progress Notes (Signed)
Pt put back on full support due to increased RR in mid 50's and shallow breaths. RT increased PS up to 20 with no improvement, RR still over 35 and still shallow breathing. RT will try again, RN aware.

## 2012-06-19 NOTE — Progress Notes (Signed)
Bite block placed due to pt biting down on ETT.

## 2012-06-20 LAB — GLUCOSE, CAPILLARY
Glucose-Capillary: 123 mg/dL — ABNORMAL HIGH (ref 70–99)
Glucose-Capillary: 138 mg/dL — ABNORMAL HIGH (ref 70–99)
Glucose-Capillary: 138 mg/dL — ABNORMAL HIGH (ref 70–99)
Glucose-Capillary: 189 mg/dL — ABNORMAL HIGH (ref 70–99)

## 2012-06-20 LAB — BASIC METABOLIC PANEL
BUN: 12 mg/dL (ref 6–23)
Calcium: 9.1 mg/dL (ref 8.4–10.5)
Creatinine, Ser: 0.59 mg/dL (ref 0.50–1.35)
GFR calc Af Amer: >90 mL/min (ref >90)
GFR calc non Af Amer: >90 mL/min (ref >90)
Glucose, Bld: 187 mg/dL — ABNORMAL HIGH (ref 70–99)

## 2012-06-20 LAB — CBC
HCT: 37.2 % — ABNORMAL LOW (ref 39.0–52.0)
Hemoglobin: 12.7 g/dL — ABNORMAL LOW (ref 13.0–17.0)
MCH: 31.4 pg (ref 26.0–34.0)
MCHC: 34.1 g/dL (ref 30.0–36.0)
MCV: 91.9 fL (ref 78.0–100.0)
RDW: 14.2 % (ref 11.5–15.5)

## 2012-06-20 MED ORDER — LORAZEPAM 2 MG/ML IJ SOLN
1.0000 mg | INTRAMUSCULAR | Status: DC | PRN
  Administered 2012-06-20 – 2012-06-21 (×4): 2 mg via INTRAVENOUS
  Administered 2012-06-21: 4 mg via INTRAVENOUS
  Administered 2012-06-21: 2 mg via INTRAVENOUS
  Filled 2012-06-20 (×2): qty 1
  Filled 2012-06-20: qty 2
  Filled 2012-06-20 (×3): qty 1
  Filled 2012-06-20: qty 2

## 2012-06-20 MED ORDER — ASPIRIN 325 MG PO TABS
325.0000 mg | ORAL_TABLET | Freq: Every day | ORAL | Status: DC
  Filled 2012-06-20: qty 1

## 2012-06-20 MED ORDER — NICOTINE 7 MG/24HR TD PT24
7.0000 mg | MEDICATED_PATCH | Freq: Every day | TRANSDERMAL | Status: DC
  Administered 2012-06-20 – 2012-06-25 (×6): 7 mg via TRANSDERMAL
  Filled 2012-06-20 (×6): qty 1

## 2012-06-20 MED ORDER — METOPROLOL TARTRATE 1 MG/ML IV SOLN
5.0000 mg | Freq: Four times a day (QID) | INTRAVENOUS | Status: DC
  Administered 2012-06-20 (×2): 5 mg via INTRAVENOUS
  Filled 2012-06-20 (×3): qty 5

## 2012-06-20 MED ORDER — PANTOPRAZOLE SODIUM 40 MG PO PACK
40.0000 mg | PACK | ORAL | Status: DC
  Filled 2012-06-20: qty 20

## 2012-06-20 MED ORDER — HALOPERIDOL LACTATE 5 MG/ML IJ SOLN
5.0000 mg | Freq: Four times a day (QID) | INTRAMUSCULAR | Status: DC | PRN
  Administered 2012-06-20 – 2012-06-22 (×3): 5 mg via INTRAVENOUS
  Filled 2012-06-20 (×2): qty 1

## 2012-06-20 MED ORDER — PANTOPRAZOLE SODIUM 40 MG PO TBEC: 40.0000 mg | DELAYED_RELEASE_TABLET | Freq: Every day | ORAL | Status: DC

## 2012-06-20 MED ORDER — HALOPERIDOL LACTATE 5 MG/ML IJ SOLN
INTRAMUSCULAR | Status: AC
  Filled 2012-06-20: qty 1

## 2012-06-20 MED ORDER — ASPIRIN 325 MG PO TABS
325.0000 mg | ORAL_TABLET | Freq: Every day | ORAL | Status: DC
  Administered 2012-06-22 – 2012-06-25 (×4): 325 mg via ORAL
  Filled 2012-06-20 (×6): qty 1

## 2012-06-20 MED ORDER — ASPIRIN 300 MG RE SUPP
300.0000 mg | Freq: Every day | RECTAL | Status: DC
  Administered 2012-06-20 – 2012-06-21 (×2): 300 mg via RECTAL
  Filled 2012-06-20 (×5): qty 1

## 2012-06-20 MED ORDER — PANTOPRAZOLE SODIUM 40 MG IV SOLR
40.0000 mg | INTRAVENOUS | Status: DC
  Administered 2012-06-20 – 2012-06-24 (×5): 40 mg via INTRAVENOUS
  Filled 2012-06-20 (×6): qty 40

## 2012-06-20 NOTE — Progress Notes (Signed)
PULMONARY  / CRITICAL CARE MEDICINE  Name: Mark [name] MRN: [id number] DOB: 04/13/1965    ADMISSION DATE:  06/09/2012 CONSULTATION DATE:  06/11/12  REFERRING MD :  VANN/TRH  CHIEF COMPLAINT:  ETOH withdraw   BRIEF PATIENT DESCRIPTION: 47 y/o with history of ETOH abuse, HTN, dilated cardiomyopathy, and anxiety admitted from the ED on 4/1 with headache x 1 week, diffuse abdominal pain x 5 days and chronic worsening chest pain.  [name] were elevated but have been trending down.  Abd [place] was negative.  Cardiac biomarkers negative.  Last drink on 4/1 12AM.  Patient has been on CIWA protocol since admission but has become increasingly confused and combative despite treatment.  The patient was not able to be managed on the floor and has been admitted to the MICU for management of withdraw.        SIGNIFICANT EVENTS / STUDIES:  4/1 Echo - Mild LVH. Systolic function normal. The estimated EF 55-60%. 4/3 intubation  4/7: self extubated and re- intubated 4/11: started on ativan gtt  LINES / TUBES: 4/3 ETT>>> 4/3 R IJ >>>  CULTURES: 4/3 Urine ctx>>> negative 4/6 Urine ctx>> coagulase negative staph aureus 4/7  Blood culture>>NGTD 4/7 sputum culture>> Normal oropharyngeal flora  ANTIBIOTICS:  4/1 Diflucan 4/3 Vanc 4/7>> 4/12 Zosyn 4/7>>  Tests/events:  LFT's 4/10: show  Elevated ALP ( 98>161) Ultrasound 4/1: Fatty changes in the liver. Otherwise unremarkable. KUB - 4/6: normal bowel gas pattern   SUBJECTIVE:  Sedated.  Tolerating pressure support.  VITAL SIGNS: Temp:  [98 F (36.7 C)-98.8 F (37.1 C)] 98.8 F (37.1 C) (04/12 0747) Pulse Rate:  [54-73] 69 (04/12 0814) Resp:  [15-35] 31 (04/12 0814) BP: (125-177)/(68-112) 152/91 mmHg (04/12 0814) SpO2:  [96 %-100 %] 100 % (04/12 0814) FiO2 (%):  [39.6 %-88.7 %] 40 % (04/12 0815) HEMODYNAMICS:    Vent Mode:  [-] CPAP;PSV FiO2 (%):  [39.6 %-88.7 %] 40 % Set Rate:  [16 bmp] 16 bmp Vt Set:  [600 mL] 600 mL PEEP:  [5 cmH20] 5  cmH20 Pressure Support:  [10 cmH20] 10 cmH20 Plateau Pressure:  [14 cmH20-28 cmH20] 14 cmH20 INTAKE / OUTPUT: Intake/Output     04/11 0701 - 04/12 0700 04/12 0701 - 04/13 0700   I.V. (mL/kg) 1383.2 (15.2) 10.6 (0.1)   NG/GT 1235    IV Piggyback 850    Total Intake(mL/kg) 3468.2 (38) 10.6 (0.1)   Urine (mL/kg/hr) 1990 (0.9)    Total Output 1990     Net +1478.2 +10.6        Urine Occurrence 1 x      PHYSICAL EXAMINATION: General: No distress Neuro: sedated, follows simple commands HEENT: ETT in place Cardiovascular: regular Lungs: scattered rhonchi Abdomen: soft, non tender  Musculoskeletal: no edema Skin: no rashes  LABS:  Recent Labs Lab 06/18/12 0400 06/19/12 0400 06/20/12 0420  NA 138 139 137  K 4.1 3.1* 3.4*  CL 103 103 102  CO2 26 28 27   BUN 12 12 12   CREATININE 0.62 0.65 0.59  GLUCOSE 163* 181* 187*    Recent Labs Lab 06/18/12 0400 06/19/12 0400 06/20/12 0420  HGB 13.6 13.2 12.7*  HCT 37.6* 36.9* 37.2*  WBC 10.2 12.4* 13.7*  PLT 428* 490* 525*    Recent Labs Lab 06/17/12 1130  AST 47*  ALT 44  ALKPHOS 161*  BILITOT 0.6  PROT 6.8  ALBUMIN 2.6*      Recent Labs Lab 06/19/12 1507 06/19/12 2004 06/20/12 0003 06/20/12  0415 06/20/12 0730  GLUCAP 153* 140* 138* 189* 138*    Imaging: Dg Chest Port 1 View  06/19/2012  *RADIOLOGY REPORT*  Clinical Data: Pneumonia  PORTABLE CHEST - 1 VIEW  Comparison: 06/16/2012; 06/09/2012; 12/09/2011; chest CT - 12/09/2011  Findings: Grossly unchanged cardiac silhouette and mediastinal contours.  Stable position of support apparatus.  No pneumothorax. Improved aeration of the bilateral lung bases with persistent bilateral medial basilar heterogeneous / consolidative opacity.  No new focal airspace opacities.  No definite pleural effusion. Unchanged bones including lower cervical ACDF, incompletely evaluated.  IMPRESSION: 1.  Stable positioning of support apparatus.  No pneumothorax. 2.  Improved aeration of  the lungs with persistent bilateral medial basilar airspace opacities, atelectasis versus infiltrate.   Original Report Authenticated By: Tacey Ruiz, MD        ASSESSMENT / PLAN:   PULMONARY A: VDRF , due to alcohol withdrawal and encephalopathy Self extubated and then re- intubated - 4/7 P:   -pressure support wean as tolerated >> plan for extubation when mental status allows -decrease nicotine patch to 7 mg  CARDIOVASCULAR A:  History of dilated cardiomyopathy- Likely secondary to alcohol abuse- Chest pain resolved, biomarkers negative, 4-1 Echo showed EF 55-60% Prolonged QTc > 550, patient had been on Methadone >> resolved. P:  -continue coreg, catapres -hold Methadone -monitor heart rhythm -resume ASA  RENAL A:  Hypokalemia. P:   -monitor renal fx, urine outpt -f/u and replace electrolytes as needed  GASTROINTESTINAL A: Abdominal pain - Elevated LFTs - Likely secondary to alcoholic hepatic disease, Abd [place] negative except for fatty infiltrate of liver. His ALP is trending up. 98>161 Viral hepatitis panel- negative. P:   -f/u LFT's intermittently -tube feeds while on vent -protonix for SUP   INFECTIOUS A:  Probable LLL HCAP. P:   -Continue zosyn D 6/7 abx -d/c vancomycin  HEMATOLOGIC A: Mild anemia. 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  Hx of ETOH withdrawal seizures. P:   -Continue precedex -d/c ativan gtt -CIWA q4h with prn ativan for CIWA > 8  CC time 35 minutes.  Coralyn Helling, MD [facility] Pulmonary/Critical Care 06/20/2012, 8:28 AM Pager:  [phone] After 3pm call: [phone]

## 2012-06-20 NOTE — Procedures (Signed)
Extubation Procedure Note  Patient Details:   Name: Mark [name] DOB: 17-Nov-1965 MRN: [id number]   Airway Documentation:     Evaluation  O2 sats: stable throughout Complications: No apparent complications Patient did tolerate procedure well. Bilateral Breath Sounds: Clear;Diminished Suctioning: Airway Yes  Ave Filter 06/20/2012, 9:24 AM

## 2012-06-21 ENCOUNTER — Inpatient Hospital Stay (HOSPITAL_COMMUNITY): Payer: MEDICAID

## 2012-06-21 DIAGNOSIS — I426 Alcoholic cardiomyopathy: Secondary | ICD-10-CM

## 2012-06-21 LAB — CULTURE, BLOOD (ROUTINE X 2): Culture: NO GROWTH

## 2012-06-21 LAB — GLUCOSE, CAPILLARY
Glucose-Capillary: 105 mg/dL — ABNORMAL HIGH (ref 70–99)
Glucose-Capillary: 108 mg/dL — ABNORMAL HIGH (ref 70–99)
Glucose-Capillary: 114 mg/dL — ABNORMAL HIGH (ref 70–99)
Glucose-Capillary: 92 mg/dL (ref 70–99)
Glucose-Capillary: 96 mg/dL (ref 70–99)

## 2012-06-21 LAB — CBC
HCT: 39.4 % (ref 39.0–52.0)
Hemoglobin: 14 g/dL (ref 13.0–17.0)
MCH: 31.7 pg (ref 26.0–34.0)
MCHC: 35.5 g/dL (ref 30.0–36.0)
MCV: 89.3 fL (ref 78.0–100.0)
RBC: 4.41 MIL/uL (ref 4.22–5.81)

## 2012-06-21 LAB — COMPREHENSIVE METABOLIC PANEL
ALT: 46 U/L (ref 0–53)
AST: 33 U/L (ref 0–37)
Albumin: 3 g/dL — ABNORMAL LOW (ref 3.5–5.2)
Alkaline Phosphatase: 145 U/L — ABNORMAL HIGH (ref 39–117)
Chloride: 100 mEq/L (ref 96–112)
Potassium: 3.8 mEq/L (ref 3.5–5.1)
Sodium: 139 mEq/L (ref 135–145)
Total Bilirubin: 0.5 mg/dL (ref 0.3–1.2)
Total Protein: 7.1 g/dL (ref 6.0–8.3)

## 2012-06-21 LAB — MAGNESIUM: Magnesium: 2.4 mg/dL (ref 1.5–2.5)

## 2012-06-21 MED ORDER — METOPROLOL TARTRATE 1 MG/ML IV SOLN
5.0000 mg | Freq: Four times a day (QID) | INTRAVENOUS | Status: DC
  Administered 2012-06-21 – 2012-06-23 (×7): 5 mg via INTRAVENOUS
  Filled 2012-06-21 (×12): qty 5

## 2012-06-21 NOTE — Progress Notes (Signed)
SLP Cancellation Note  Patient Details Name: Mark [name] MRN: [id number] DOB: [birth year]   Cancelled treatment:  Received order for BSE.  Attempt x1 but not completed due to patient presenting with AMS and lethargy increasing risk for aspiration with PO trials. ST to reattempt on 06/22/12.  RN to page SLP if mentation improves. Moreen Fowler MS, CCC-SLP [phone] [facility] 06/21/2012, 9:34 AM

## 2012-06-21 NOTE — Progress Notes (Signed)
PULMONARY  / CRITICAL CARE MEDICINE  Name: Mark [name] MRN: [id number] DOB: February 10, 1966    ADMISSION DATE:  06/09/2012 CONSULTATION DATE:  06/11/12  REFERRING MD :  VANN/TRH  CHIEF COMPLAINT:  ETOH withdraw   BRIEF PATIENT DESCRIPTION: 47 y/o with history of ETOH abuse, HTN, dilated cardiomyopathy, and anxiety admitted from the ED on 4/1 with headache x 1 week, diffuse abdominal pain x 5 days and chronic worsening chest pain.  [name] were elevated but have been trending down.  Abd [place] was negative.  Cardiac biomarkers negative.  Last drink on 4/1 12AM.  Patient has been on CIWA protocol since admission but has become increasingly confused and combative despite treatment.  The patient was not able to be managed on the floor and has been admitted to the MICU for management of withdraw.        SIGNIFICANT EVENTS / STUDIES:  4/1 Echo - Mild LVH. Systolic function normal. The estimated EF 55-60%. 4/3 intubation  4/7: self extubated and re- intubated 4/11: started on ativan gtt 4/12: Extubated 4/13: Precedex and Ativan prn  LINES / TUBES: 4/3 ETT>>> 4/12 4/3 R IJ >>>  CULTURES: 4/3 Urine ctx>>> negative 4/6 Urine ctx>> coagulase negative staph aureus 4/7  Blood culture>>NGTD 4/7 sputum culture>> Normal oropharyngeal flora  ANTIBIOTICS:  4/1 Diflucan 4/3 Vanc 4/7>> 4/12 Zosyn 4/7>> 4/13  Tests/events:  LFT's 4/10: show  Elevated ALP ( 98>161) Ultrasound 4/1: Fatty changes in the liver. Otherwise unremarkable. KUB - 4/6: normal bowel gas pattern   SUBJECTIVE:  Extubated yesterday. Intermittently agitated.  VITAL SIGNS: Temp:  [98.2 F (36.8 C)-99.3 F (37.4 C)] 98.6 F (37 C) (04/13 0745) Pulse Rate:  [47-80] 68 (04/13 0900) Resp:  [13-38] 17 (04/13 0900) BP: (105-188)/(73-98) 165/86 mmHg (04/13 0900) SpO2:  [92 %-100 %] 94 % (04/13 0900) Weight:  [195 lb 8.8 oz (88.7 kg)] 195 lb 8.8 oz (88.7 kg) (04/13 0500) HEMODYNAMICS:      INTAKE / OUTPUT: Intake/Output      04/12 0701 - 04/13 0700 04/13 0701 - 04/14 0700   I.V. (mL/kg) 1148.7 (13) 113.8 (1.3)   NG/GT 75    IV Piggyback 137.5 12.5   Total Intake(mL/kg) 1361.2 (15.3) 126.3 (1.4)   Urine (mL/kg/hr) 2260 (1.1) 60 (0.2)   Total Output 2260 60   Net -898.8 +66.3          PHYSICAL EXAMINATION: General: Mildly agitated. Neuro: follows simple commands HEENT: MMM, PERRL, no icterus. Cardiovascular: regular Lungs: scattered rhonchi Abdomen: soft, non tender  Musculoskeletal: no edema Skin: no rashes  LABS:  Recent Labs Lab 06/19/12 0400 06/20/12 0420 06/21/12 0500  NA 139 137 139  K 3.1* 3.4* 3.8  CL 103 102 100  CO2 28 27 28   BUN 12 12 11   CREATININE 0.65 0.59 0.64  GLUCOSE 181* 187* 114*    Recent Labs Lab 06/19/12 0400 06/20/12 0420 06/21/12 0500  HGB 13.2 12.7* 14.0  HCT 36.9* 37.2* 39.4  WBC 12.4* 13.7* 18.5*  PLT 490* 525* 547*    Recent Labs Lab 06/17/12 1130 06/21/12 0500  AST 47* 33  ALT 44 46  ALKPHOS 161* 145*  BILITOT 0.6 0.5  PROT 6.8 7.1  ALBUMIN 2.6* 3.0*      Recent Labs Lab 06/20/12 1526 06/20/12 1958 06/20/12 2348 06/21/12 0431 06/21/12 0729  GLUCAP 123* 114* 114* 108* 105*    Imaging: Dg Chest Port 1 View  06/21/2012  *RADIOLOGY REPORT*  Clinical Data: Follow-up pneumonia.  PORTABLE  CHEST - 1 VIEW  Comparison: 06/19/2012  Findings: Nasogastric tube is no longer seen.  Right jugular center venous catheter remains in appropriate position.  Mild right basilar infiltrate or atelectasis shows no significant change.  Left lung remains clear.  Heart size is stable.  IMPRESSION: Mild right basilar atelectasis or infiltrate, without significant change.   Original Report Authenticated By: Myles Rosenthal, M.D.        ASSESSMENT / PLAN:   PULMONARY A: VDRF- resolved -- due to alcohol withdrawal and encephalopathy Self extubated and then re- intubated - 4/7 Extubated 4/12. P:   - Continue monitor. -decreased nicotine patch to 7  mg  CARDIOVASCULAR A:  History of dilated cardiomyopathy- Likely secondary to alcohol abuse- Chest pain resolved, biomarkers negative, 4-1 Echo showed EF 55-60% Prolonged QTc > 550, patient had been on Methadone >> resolved. P:  -lopressor IV for now -continue catapres -hold Methadone -monitor heart rhythm -resumed ASA  RENAL A:  Hypokalemia.  P:   -monitor renal fx, urine outpt -f/u and replace electrolytes as needed  GASTROINTESTINAL A: Abdominal pain - Elevated LFTs - Likely secondary to alcoholic hepatic disease, Abd [place] negative except for fatty infiltrate of liver. His ALP is trending up. 98>161 Viral hepatitis panel- negative. P:   -f/u LFT's intermittently -speech therapy to assess swallowing -protonix for SUP   INFECTIOUS A:  Probable LLL HCAP. P:   - zosyn D 7/7 abx  HEMATOLOGIC A: Mild anemia. 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  Hx of ETOH withdrawal seizures. P:   -Continue precedex -CIWA q4h with prn ativan for CIWA > 8  CC time 35 minutes.  Coralyn Helling, MD [facility] Pulmonary/Critical Care 06/21/2012, 1:56 PM Pager:  [phone] After 3pm call: [phone]

## 2012-06-22 ENCOUNTER — Encounter (HOSPITAL_COMMUNITY): Payer: Self-pay | Admitting: *Deleted

## 2012-06-22 LAB — GLUCOSE, CAPILLARY
Glucose-Capillary: 119 mg/dL — ABNORMAL HIGH (ref 70–99)
Glucose-Capillary: 126 mg/dL — ABNORMAL HIGH (ref 70–99)
Glucose-Capillary: 150 mg/dL — ABNORMAL HIGH (ref 70–99)

## 2012-06-22 LAB — BASIC METABOLIC PANEL
CO2: 27 mEq/L (ref 19–32)
Calcium: 9.8 mg/dL (ref 8.4–10.5)
Creatinine, Ser: 0.76 mg/dL (ref 0.50–1.35)
GFR calc non Af Amer: >90 mL/min (ref >90)
Sodium: 138 mEq/L (ref 135–145)

## 2012-06-22 MED ORDER — LORAZEPAM 0.5 MG PO TABS
0.5000 mg | ORAL_TABLET | Freq: Four times a day (QID) | ORAL | Status: DC
  Administered 2012-06-22 – 2012-06-25 (×11): 0.5 mg via ORAL
  Filled 2012-06-22 (×10): qty 1

## 2012-06-22 MED ORDER — LORAZEPAM 0.5 MG PO TABS: 0.5000 mg | ORAL_TABLET | ORAL | Status: DC

## 2012-06-22 MED ORDER — RESOURCE THICKENUP CLEAR PO POWD
ORAL | Status: DC | PRN
  Filled 2012-06-22: qty 125

## 2012-06-22 MED ORDER — POTASSIUM CHLORIDE CRYS ER 20 MEQ PO TBCR
40.0000 meq | EXTENDED_RELEASE_TABLET | ORAL | Status: AC
  Administered 2012-06-22 (×2): 40 meq via ORAL
  Filled 2012-06-22 (×2): qty 2

## 2012-06-22 MED ORDER — LORAZEPAM 0.5 MG PO TABS: 0.5000 mg | ORAL_TABLET | ORAL | Status: DC | PRN

## 2012-06-22 MED ORDER — LORAZEPAM 0.5 MG PO TABS
0.5000 mg | ORAL_TABLET | ORAL | Status: DC | PRN
  Administered 2012-06-23: 0.5 mg via ORAL
  Administered 2012-06-23: 1 mg via ORAL
  Filled 2012-06-22: qty 2
  Filled 2012-06-22 (×2): qty 1

## 2012-06-22 MED ORDER — LORAZEPAM 2 MG/ML IJ SOLN
INTRAMUSCULAR | Status: AC
  Administered 2012-06-22: 1 mg
  Filled 2012-06-22: qty 1

## 2012-06-22 MED ORDER — ADULT MULTIVITAMIN W/MINERALS CH
1.0000 | ORAL_TABLET | Freq: Every day | ORAL | Status: DC
  Administered 2012-06-22 – 2012-06-25 (×4): 1 via ORAL
  Filled 2012-06-22 (×4): qty 1

## 2012-06-22 MED ORDER — LORAZEPAM 0.5 MG PO TABS
0.5000 mg | ORAL_TABLET | Freq: Two times a day (BID) | ORAL | Status: DC
  Administered 2012-06-22: 0.5 mg via ORAL

## 2012-06-22 MED ORDER — THIAMINE HCL 100 MG/ML IJ SOLN
100.0000 mg | Freq: Every day | INTRAMUSCULAR | Status: DC
  Administered 2012-06-22 – 2012-06-24 (×3): 100 mg via INTRAVENOUS
  Filled 2012-06-22 (×4): qty 1

## 2012-06-22 MED ORDER — SODIUM CHLORIDE 0.9 % IV SOLN
1.0000 mg | Freq: Once | INTRAVENOUS | Status: AC
  Administered 2012-06-22: 1 mg via INTRAVENOUS
  Filled 2012-06-22 (×2): qty 0.2

## 2012-06-22 NOTE — Evaluation (Signed)
Clinical/Bedside Swallow Evaluation Patient Details  Name: Mark [name] MRN: [id number] Date of Birth: June 05, 1965  Today's Date: 06/22/2012 Time: 1000-1025 SLP Time Calculation (min): 25 min  Past Medical History:  Past Medical History  Diagnosis Date  . Hypertension   . Hyperlipidemia   . 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 ETOH abuse   . GERD (gastroesophageal reflux disease)   . Mental disorder   . Irregular heart beat   . Anxiety   . Shortness of breath   . Headache   . Hepatitis    Past Surgical History:  Past Surgical History  Procedure Laterality Date  . Back surgery    . Neck surgery    . Fundoplasty transthoracic  2003  . Nasal sinus surgery     HPI:  47 y/o with history of ETOH abuse, HTN, dilated cardiomyopathy, and anxiety admitted from the ED on 4/1 with headache x 1 week, diffuse abdominal pain x 5 days and chronic worsening chest pain.  [name] were elevated but have been trending down.  Abd [place] was negative.  Cardiac biomarkers negative.  Last drink on 4/1 12AM.  Patient has been on CIWA protocol since admission but has become increasingly confused and combative despite treatment.  Pt was intubated 4/3, self extubated 4/7, reintubated until 4/12. Pt with a hsitory of Nissen funduplication. Last UGI normal 01/18/05/    Assessment / Plan / Recommendation Clinical Impression  Pt demonstrates evidence of an acute reversible dysphagia with decreased airway protection and overt evidence of aspiration followimg a 10 day period of intubation. Pts voice is hoarse and there is immediate and delayed throat clearing/cough with water. Pt WFL with nectar thick liquids and solid textures. Recommend initiating a dys 3 (mechanical soft) diet with nectar thick liquids, with upgrade as pts vocal quality and subjective evidence of aspiration improve.     Aspiration Risk  Moderate    Diet Recommendation Dysphagia 3 (Mechanical  Soft);Nectar-thick liquid   Liquid Administration via: Cup Medication Administration: Whole meds with puree Supervision: Patient able to self feed Compensations: Slow rate;Small sips/bites Postural Changes and/or Swallow Maneuvers: Seated upright 90 degrees    Other  Recommendations Oral Care Recommendations: Oral care BID Other Recommendations: Order thickener from pharmacy   Follow Up Recommendations  None    Frequency and Duration min 2x/week  2 weeks   Pertinent Vitals/Pain NA    SLP Swallow Goals Patient will utilize recommended strategies during swallow to increase swallowing safety with: Minimal cueing Goal #3: Pt will consume trials of thin liquids without wet vocal quality or cough/throat clear in better than 90% of small sips.    Swallow Study Prior Functional Status       General HPI: 47 y/o with history of ETOH abuse, HTN, dilated cardiomyopathy, and anxiety admitted from the ED on 4/1 with headache x 1 week, diffuse abdominal pain x 5 days and chronic worsening chest pain.  [name] were elevated but have been trending down.  Abd [place] was negative.  Cardiac biomarkers negative.  Last drink on 4/1 12AM.  Patient has been on CIWA protocol since admission but has become increasingly confused and combative despite treatment.  Pt was intubated 4/3, self extubated 4/7, reintubated until 4/12. Pt with a hsitory of Nissen funduplication. Last UGI normal 01/18/05/  Type of Study: Bedside swallow evaluation Previous Swallow Assessment: none Diet Prior to this Study: NPO Temperature Spikes Noted: No Respiratory Status:  Supplemental O2 delivered via (comment) History of Recent Intubation: Yes Length of Intubations (days): 10 days Date extubated: 06/20/12 Behavior/Cognition: Alert;Cooperative;Confused Oral Cavity - Dentition: Adequate natural dentition Self-Feeding Abilities: Able to feed self Patient Positioning: Upright in bed Baseline Vocal Quality: Hoarse;Breathy;Low vocal  intensity Volitional Cough: Strong Volitional Swallow: Able to elicit    Oral/Motor/Sensory Function Overall Oral Motor/Sensory Function: Appears within functional limits for tasks assessed   Ice Chips Ice chips: Impaired Presentation: Spoon Pharyngeal Phase Impairments: Throat Clearing - Immediate   Thin Liquid Thin Liquid: Impaired Presentation: Cup;Straw;Self Fed Pharyngeal  Phase Impairments: Wet Vocal Quality;Throat Clearing - Immediate;Cough - Immediate    Nectar Thick Nectar Thick Liquid: Within functional limits   Honey Thick Honey Thick Liquid: Not tested   Puree Puree: Within functional limits   Solid   GO    Solid: Within functional limits       Champayne Kocian, Riley Nearing 06/22/2012,10:36 AM

## 2012-06-22 NOTE — Progress Notes (Signed)
PULMONARY  / CRITICAL CARE MEDICINE  Name: Mark [name] MRN: [id number] DOB: 19-Sep-1965    ADMISSION DATE:  06/09/2012 CONSULTATION DATE:  06/11/12  REFERRING MD :  VANN/TRH  CHIEF COMPLAINT:  ETOH withdraw   BRIEF PATIENT DESCRIPTION: 47 y/o with history of ETOH abuse, HTN, dilated cardiomyopathy, and anxiety admitted from the ED on 4/1 with headache x 1 week, diffuse abdominal pain x 5 days and chronic worsening chest pain.  [name] were elevated but have been trending down.  Abd [place] was negative.  Cardiac biomarkers negative.  Last drink on 4/1 12AM.  Patient has been on CIWA protocol since admission but has become increasingly confused and combative despite treatment.  The patient was not able to be managed on the floor and has been admitted to the MICU for management of withdraw.       SIGNIFICANT EVENTS / STUDIES:  4/1 Echo - Mild LVH. Systolic function normal. The estimated EF 55-60%. 4/3 intubation  4/7: self extubated and re- intubated 4/11: started on ativan gtt 4/12: Extubated 4/13: Precedex and Ativan prn 4/14: Precedex and Ativan  LINES / TUBES: 4/3 ETT>>> 4/12 4/3 R IJ >>>  CULTURES: 4/3 Urine ctx>>> negative 4/6 Urine ctx>> coagulase negative staph aureus 4/7  Blood culture>>NGTD 4/7 sputum culture>> Normal oropharyngeal flora  ANTIBIOTICS:  4/1 Diflucan 4/3 Vanc 4/7>> 4/12 Zosyn 4/7>> 4/13  Tests/events:  LFT's 4/10: show  Elevated ALP ( 98>161) Ultrasound 4/1: Fatty changes in the liver. Otherwise unremarkable. KUB - 4/6: normal bowel gas pattern   SUBJECTIVE:  Extubated 4/12. Improved mental status, remain son precedex  VITAL SIGNS: Temp:  [97.5 F (36.4 C)-98.1 F (36.7 C)] 98 F (36.7 C) (04/14 0830) Pulse Rate:  [59-77] 69 (04/14 0800) Resp:  [15-35] 21 (04/14 0800) BP: (103-165)/(69-125) 119/69 mmHg (04/14 0800) SpO2:  [93 %-98 %] 95 % (04/14 0800) Weight:  [193 lb 12.6 oz (87.9 kg)] 193 lb 12.6 oz (87.9 kg) (04/14 0500) HEMODYNAMICS:      INTAKE / OUTPUT: Intake/Output     04/13 0701 - 04/14 0700 04/14 0701 - 04/15 0700   I.V. (mL/kg) 1055.5 (12) 20.7 (0.2)   NG/GT     IV Piggyback 162.5 12.5   Total Intake(mL/kg) 1218 (13.9) 33.2 (0.4)   Urine (mL/kg/hr) 1525 (0.7) 350 (2.2)   Total Output 1525 350   Net -307 -316.8        Stool Occurrence  1 x     PHYSICAL EXAMINATION: General: NAD. Neuro: Alert and oriented. HEENT: MMM, PERRL, no icterus. Cardiovascular: regular Lungs: scattered rhonchi Abdomen: soft, non tender  Musculoskeletal: no edema Skin: no rashes  LABS:  Recent Labs Lab 06/20/12 0420 06/21/12 0500 06/22/12 0430  NA 137 139 138  K 3.4* 3.8 3.6  CL 102 100 99  CO2 27 28 27   BUN 12 11 18   CREATININE 0.59 0.64 0.76  GLUCOSE 187* 114* 114*    Recent Labs Lab 06/19/12 0400 06/20/12 0420 06/21/12 0500  HGB 13.2 12.7* 14.0  HCT 36.9* 37.2* 39.4  WBC 12.4* 13.7* 18.5*  PLT 490* 525* 547*    Recent Labs Lab 06/17/12 1130 06/21/12 0500  AST 47* 33  ALT 44 46  ALKPHOS 161* 145*  BILITOT 0.6 0.5  PROT 6.8 7.1  ALBUMIN 2.6* 3.0*      Recent Labs Lab 06/21/12 1208 06/21/12 1502 06/21/12 1937 06/22/12 0006 06/22/12 0357  GLUCAP 101* 96 92 104* 99    Imaging: Dg Chest Port 1 [address]  06/21/2012  *RADIOLOGY REPORT*  Clinical Data: Follow-up pneumonia.  PORTABLE CHEST - 1 VIEW  Comparison: 06/19/2012  Findings: Nasogastric tube is no longer seen.  Right jugular center venous catheter remains in appropriate position.  Mild right basilar infiltrate or atelectasis shows no significant change.  Left lung remains clear.  Heart size is stable.  IMPRESSION: Mild right basilar atelectasis or infiltrate, without significant change.   Original Report Authenticated By: Myles Rosenthal, M.D.     ASSESSMENT / PLAN:  PULMONARY A: VDRF- resolved -- due to alcohol withdrawal and encephalopathy Self extubated and then re- intubated - 4/7 Extubated 4/12. P:   - Continue monitor. - decreased  nicotine patch to 7 mg -IS -Upright position  CARDIOVASCULAR A:  History of dilated cardiomyopathy- Likely secondary to alcohol abuse- Chest pain resolved, biomarkers negative, 4-1 Echo showed EF 55-60% Prolonged QTc > 550, patient had been on Methadone >> resolved. P:  -lopressor IV for now -continue catapres -hold Methadone, had long QTC -monitor heart rhythm while on precedex -resumed ASA -if continued to use haldol IV, need daily qtc   RENAL A:  Hypokalemia.  P:   -monitor renal fx, urine outpt -with cardiomyopathy, correct K to goal greater 4, mag greater 2   GASTROINTESTINAL A: Abdominal pain - Elevated LFTs - Likely secondary to alcoholic hepatic disease, Abd [place] negative except for fatty infiltrate of liver. His ALP is trending up. 98>161 Viral hepatitis panel- negative. P:   -f/u LFT's intermittently -SLP to assess swallowing- was lethargic yesterday. Repeat eval today. -Protonix for SUP  INFECTIOUS A:  Probable LLL HCAP. P:   - zosyn D 7/7 abx. 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  Hx of ETOH withdrawal seizures. P:   -Continue precedex, attempt to dc -CIWA not using in ICU, use physician  Directed -add ativan 05 mg q12h -PT consult -assess qtc ecg now   PATEL,[name], MD 8:52 AM  CC time 30 minutes. Continued precedex, neuro drip I have fully examined this patient and agree with above findings.    And edited i nfull   [name]. Tyson Alias, MD, FACP Pgr: [phone] [facility]

## 2012-06-23 LAB — GLUCOSE, CAPILLARY
Glucose-Capillary: 118 mg/dL — ABNORMAL HIGH (ref 70–99)
Glucose-Capillary: 87 mg/dL (ref 70–99)
Glucose-Capillary: 130 mg/dL — ABNORMAL HIGH (ref 70–99)

## 2012-06-23 LAB — BASIC METABOLIC PANEL
BUN: 16 mg/dL (ref 6–23)
CO2: 24 mEq/L (ref 19–32)
Calcium: 9.6 mg/dL (ref 8.4–10.5)
Chloride: 103 mEq/L (ref 96–112)
Creatinine, Ser: 0.68 mg/dL (ref 0.50–1.35)

## 2012-06-23 LAB — CBC
HCT: 38.8 % — ABNORMAL LOW (ref 39.0–52.0)
MCH: 31.8 pg (ref 26.0–34.0)
MCV: 89.4 fL (ref 78.0–100.0)
Platelets: 553 10*3/uL — ABNORMAL HIGH (ref 150–400)
RBC: 4.34 MIL/uL (ref 4.22–5.81)
RDW: 14.2 % (ref 11.5–15.5)

## 2012-06-23 MED ORDER — METOPROLOL TARTRATE 25 MG PO TABS
25.0000 mg | ORAL_TABLET | Freq: Two times a day (BID) | ORAL | Status: DC
  Administered 2012-06-23 – 2012-06-24 (×3): 25 mg via ORAL
  Filled 2012-06-23 (×6): qty 1

## 2012-06-23 MED ORDER — CLONIDINE HCL 0.1 MG PO TABS
0.1000 mg | ORAL_TABLET | Freq: Three times a day (TID) | ORAL | Status: DC
Start: 2012-06-23 — End: 2012-06-25
  Administered 2012-06-23 – 2012-06-25 (×6): 0.1 mg via ORAL
  Filled 2012-06-23 (×9): qty 1

## 2012-06-23 MED ORDER — ZOLPIDEM TARTRATE 5 MG PO TABS
5.0000 mg | ORAL_TABLET | Freq: Once | ORAL | Status: AC
  Administered 2012-06-23: 5 mg via ORAL
  Filled 2012-06-23: qty 1

## 2012-06-23 MED ORDER — POTASSIUM CHLORIDE CRYS ER 20 MEQ PO TBCR
40.0000 meq | EXTENDED_RELEASE_TABLET | ORAL | Status: AC
  Administered 2012-06-23 (×2): 40 meq via ORAL
  Filled 2012-06-23 (×3): qty 2

## 2012-06-23 MED ORDER — RISPERIDONE 1 MG PO TABS
1.0000 mg | ORAL_TABLET | Freq: Two times a day (BID) | ORAL | Status: DC
  Administered 2012-06-23 – 2012-06-24 (×3): 1 mg via ORAL
  Filled 2012-06-23 (×5): qty 1

## 2012-06-23 NOTE — Progress Notes (Signed)
PULMONARY  / CRITICAL CARE MEDICINE  Name: Mark [name] MRN: [id number] DOB: 09-08-1965    ADMISSION DATE:  06/09/2012 CONSULTATION DATE:  06/11/12  REFERRING MD :  VANN/TRH  CHIEF COMPLAINT:  ETOH withdraw   BRIEF PATIENT DESCRIPTION: 47 y/o with history of ETOH abuse, HTN, dilated cardiomyopathy, and anxiety admitted from the ED on 4/1 with headache x 1 week, diffuse abdominal pain x 5 days and chronic worsening chest pain.  [name] were elevated but have been trending down.  Abd [place] was negative.  Cardiac biomarkers negative.  Last drink on 4/1 12AM.  Patient has been on CIWA protocol since admission but has become increasingly confused and combative despite treatment.  The patient was not able to be managed on the floor and has been admitted to the MICU for management of withdraw.       SIGNIFICANT EVENTS / STUDIES:  4/1 Echo - Mild LVH. Systolic function normal. The estimated EF 55-60%. 4/3 intubation  4/7: self extubated and re- intubated 4/11: started on ativan gtt 4/12: Extubated 4/13: Precedex and Ativan prn 4/14: Precedex and Ativan. Didn't tolerate off Precedex. 4/15: Precedex, improved clinically , off now  LINES / TUBES: 4/3 ETT>>> 4/12 4/3 R IJ >>> 4/14  CULTURES: 4/3 Urine ctx>>> negative 4/6 Urine ctx>> coagulase negative staph aureus 4/7  Blood culture>>NGTD 4/7 sputum culture>> Normal oropharyngeal flora  ANTIBIOTICS:  4/1 Diflucan 4/3 Vanc 4/7>> 4/12 Zosyn 4/7>> 4/13  Tests/events:  LFT's 4/10: show  Elevated ALP ( 98>161) Ultrasound 4/1: Fatty changes in the liver. Otherwise unremarkable. KUB - 4/6: normal bowel gas pattern   SUBJECTIVE:  Extubated 4/12. Improved mental status, remains on precedex, just dc'ed   VITAL SIGNS: Temp:  [97.8 F (36.6 C)-98.4 F (36.9 C)] 98 F (36.7 C) (04/15 0835) Pulse Rate:  [58-116] 73 (04/15 0700) Resp:  [17-30] 19 (04/15 0700) BP: (96-169)/(52-97) 131/69 mmHg (04/15 0700) SpO2:  [91 %-100 %] 98 % (04/15  0700) HEMODYNAMICS:      INTAKE / OUTPUT: Intake/Output     04/14 0701 - 04/15 0700 04/15 0701 - 04/16 0700   I.V. (mL/kg) 514.3 (5.9)    IV Piggyback 62.5    Total Intake(mL/kg) 576.8 (6.6)    Urine (mL/kg/hr) 2335 (1.1) 275 (1.8)   Total Output 2335 275   Net -1758.2 -275        Stool Occurrence 3 x      PHYSICAL EXAMINATION: General: NAD. Neuro: Alert and oriented. HEENT: MMM, PERRL, no icterus. Cardiovascular: regular Lungs: scattered rhonchi Abdomen: soft, non tender  Musculoskeletal: no edema Skin: no rashes  LABS:  Recent Labs Lab 06/21/12 0500 06/22/12 0430 06/23/12 0730  NA 139 138 139  K 3.8 3.6 3.7  CL 100 99 103  CO2 28 27 24   BUN 11 18 16   CREATININE 0.64 0.76 0.68  GLUCOSE 114* 114* 107*    Recent Labs Lab 06/20/12 0420 06/21/12 0500 06/23/12 0730  HGB 12.7* 14.0 13.8  HCT 37.2* 39.4 38.8*  WBC 13.7* 18.5* 13.6*  PLT 525* 547* 553*    Recent Labs Lab 06/17/12 1130 06/21/12 0500  AST 47* 33  ALT 44 46  ALKPHOS 161* 145*  BILITOT 0.6 0.5  PROT 6.8 7.1  ALBUMIN 2.6* 3.0*      Recent Labs Lab 06/22/12 1516 06/22/12 1928 06/22/12 2349 06/23/12 0419 06/23/12 0822  GLUCAP 119* 150* 126* 118* 87    Imaging: No results found.  ASSESSMENT / PLAN:  PULMONARY A: VDRF- resolved --  due to alcohol withdrawal and encephalopathy Self extubated and then re- intubated - 4/7 Extubated 4/12. P:   - Continue monitor. - decreased nicotine patch to 7 mg - IS - Upright position  CARDIOVASCULAR A:  History of dilated cardiomyopathy- Likely secondary to alcohol abuse- Chest pain resolved, biomarkers negative, 4-1 Echo showed EF 55-60% Prolonged QTc > 550, patient had been on Methadone >> resolved P:  -lopressor to oral -may also benefit clonidine -continue catapres to oral now -hold Methadone, had long QTC.no Haldol -monitor heart rhythm while on precedex -resumed ASA -dc hydral with tachy   RENAL A:  Hypokalemia. 3.7 P:    -monitor renal fx, urine outpt -with prior qtc, correct K to goal greater 4, mag greater 2   GASTROINTESTINAL A: Abdominal pain - Elevated LFTs - Likely secondary to alcoholic hepatic disease, Abd [place] negative except for fatty infiltrate of liver. His ALP is trending up. 98>161 Viral hepatitis panel- negative. P:   -f/u LFT's intermittently -Dysphagia 3 diet. -Protonix for SUP  INFECTIOUS A:  Probable LLL HCAP. P:   - zosyn D 7/7 abx. 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  Hx of ETOH withdrawal seizures. P:   -precedex, attempt to dc - Ativan 0.5 q6 hrs  -CIWA not using in ICU, use physician  Directed -PT consult  To sdu  PATEL,[name], MD 8:42 AM  I have fully examined this patient and agree with above findings.    And edited i nfull   [name]. Tyson Alias, MD, FACP Pgr: [phone] [facility]

## 2012-06-23 NOTE — Progress Notes (Signed)
Rehab Admissions Coordinator Note:  Patient was screened by Meryl Dare for appropriateness for an Inpatient Acute Rehab Consult.  At this time, we are recommending Inpatient Rehab consult.  Meryl Dare 06/23/2012, 4:54 PM  I can be reached at [phone].

## 2012-06-23 NOTE — Progress Notes (Addendum)
Speech Language Pathology Dysphagia Treatment Patient Details Name: Mark [name] MRN: [id number] DOB: 1966-01-17 Today's Date: 06/23/2012 Time: 5784-6962 SLP Time Calculation (min): 8 min  Assessment / Plan / Recommendation Clinical Impression  Evidence of decreased airway protection persists (Dysphonia, immediate cough with water). Pt tolerating nectar thick liquids and solid POs without difficutly, observed eating breakfast independently. SLP did need provide min verbal cues and assit for appropriate posture. Recommend pt continue nectar thick liquids today. SLP will continue to follow.     Diet Recommendation  Continue with Current Diet: Dysphagia 3 (mechanical soft);Nectar-thick liquid    SLP Plan Continue with current plan of care   Pertinent Vitals/Pain NA   Swallowing Goals  SLP Swallowing Goals Patient will utilize recommended strategies during swallow to increase swallowing safety with: Minimal cueing Goal #3: Pt will consume trials of thin liquids without wet vocal quality or cough/throat clear in better than 90% of small sips.  Swallow Study Goal #3 - Progress: Progressing toward goal  General Temperature Spikes Noted: No Respiratory Status: Supplemental O2 delivered via (comment) Behavior/Cognition: Alert;Cooperative;Confused Oral Cavity - Dentition: Adequate natural dentition Patient Positioning: Upright in bed  Oral Cavity - Oral Hygiene Does patient have any of the following "at risk" factors?: Oxygen therapy - cannula, mask, simple oxygen devices Patient is HIGH RISK - Oral Care Protocol followed (see row info): Yes Patient is AT RISK - Oral Care Protocol followed (see row info): Yes   Dysphagia Treatment Treatment focused on: Skilled observation of diet tolerance;Upgraded PO texture trials Treatment Methods/Modalities: Skilled observation Patient observed directly with PO's: Yes Type of PO's observed: Regular;Thin liquids;Nectar-thick liquids Feeding: Able  to feed self Liquids provided via: Cup Pharyngeal Phase Signs & Symptoms: Immediate cough;Wet vocal quality Type of cueing: Verbal Amount of cueing: Minimal   GO    Harlon Ditty, MA CCC-SLP [phone]  Claudine Mouton 06/23/2012, 8:55 AM

## 2012-06-23 NOTE — Evaluation (Signed)
Physical Therapy Evaluation Patient Details Name: Mark [name] MRN: [id number] DOB: 11-Feb-1966 Today's Date: 06/23/2012 Time: 0865-7846 PT Time Calculation (min): 24 min  PT Assessment / Plan / Recommendation Clinical Impression  pt presents with Encephalopathy and Etoh.  pt restless during PT, shaking LEs and quite impulsive.  2nd person utilized during session for safety and for lines.  At this point pt would benefit from CIR at D/C to maximize Independence.  Will follow.      PT Assessment  Patient needs continued PT services    Follow Up Recommendations  CIR    Does the patient have the potential to tolerate intense rehabilitation      Barriers to Discharge None      Equipment Recommendations  Rolling walker with 5" wheels    Recommendations for Other Services Rehab consult;OT consult   Frequency Min 3X/week    Precautions / Restrictions Precautions Precautions: Fall Restrictions Weight Bearing Restrictions: No   Pertinent Vitals/Pain Denies pain.        Mobility  Bed Mobility Bed Mobility: Supine to Sit;Sitting - Scoot to Edge of Bed Supine to Sit: 4: Min assist;HOB elevated Sitting - Scoot to Edge of Bed: 5: Supervision Details for Bed Mobility Assistance: cues to slow down and for safety.   Transfers Transfers: Stand to Sit;Sit to Stand Sit to Stand: 4: Min assist;With upper extremity assist;From bed;From toilet Stand to Sit: 4: Min assist;With upper extremity assist;To toilet;To chair/3-in-1 Details for Transfer Assistance: cues for use of UEs, to slow down, for safety.  pt attempting to stand multiple times needing cues to wait until lines are untangled.   Ambulation/Gait Ambulation/Gait Assistance: 4: Min assist Ambulation Distance (Feet): 10 Feet (x2) Assistive device: Rolling walker Ambulation/Gait Assistance Details: pt unsteady and requiring consistent MinA to maintain balance.  pt with 2 LOB requiring ModA to correct.   Gait Pattern: Step-through  pattern;Decreased stride length;Trunk flexed Stairs: No Wheelchair Mobility Wheelchair Mobility: No    Exercises     PT Diagnosis: Difficulty walking;Generalized weakness  PT Problem List: Decreased strength;Decreased activity tolerance;Decreased balance;Decreased mobility;Decreased cognition;Decreased coordination;Decreased knowledge of use of DME;Decreased safety awareness PT Treatment Interventions: DME instruction;Gait training;Stair training;Functional mobility training;Therapeutic activities;Therapeutic exercise;Balance training;Neuromuscular re-education;Cognitive remediation;Patient/family education   PT Goals Acute Rehab PT Goals PT Goal Formulation: With patient Time For Goal Achievement: 07/07/12 Potential to Achieve Goals: Good Pt will go Supine/Side to Sit: with modified independence PT Goal: Supine/Side to Sit - Progress: Goal set today Pt will go Sit to Supine/Side: with modified independence PT Goal: Sit to Supine/Side - Progress: Goal set today Pt will go Sit to Stand: with modified independence PT Goal: Sit to Stand - Progress: Goal set today Pt will go Stand to Sit: with modified independence PT Goal: Stand to Sit - Progress: Goal set today Pt will Ambulate: >150 feet;with modified independence;with rolling walker PT Goal: Ambulate - Progress: Goal set today Pt will Go Up / Down Stairs: Flight;with modified independence;with rail(s) PT Goal: Up/Down Stairs - Progress: Goal set today  Visit Information  Last PT Received On: 06/23/12 Assistance Needed: +2    Subjective Data  Subjective: I feel pretty good.   Patient Stated Goal: Home   Prior Functioning  Home Living Lives With:  (Mother and Step-Father) Available Help at Discharge: Family;Available 24 hours/day Type of Home: House Home Access: Level entry Home Layout: Two level Alternate Level Stairs-Number of Steps: flight Alternate Level Stairs-Rails: Right Home Adaptive Equipment: None Additional  Comments: pt lives in an  apartment in his Mother's basement.   Prior Function Level of Independence: Independent Able to Take Stairs?: Yes Driving: Yes Comments: pt able to cook for himself, but states he goes upstairs and eats with his Mom.   Communication Communication:  (Soft voice)    Cognition  Cognition Overall Cognitive Status: Impaired Area of Impairment: Following commands;Safety/judgement;Awareness of deficits;Problem solving Arousal/Alertness: Awake/alert Orientation Level: Appears intact for tasks assessed Behavior During Session: Restless Following Commands: Follows multi-step commands inconsistently Safety/Judgement: Decreased safety judgement for tasks assessed;Impulsive;Decreased awareness of need for assistance Awareness of Deficits: pt unaware of level of A needed.   Problem Solving: Slow to process.      Extremity/Trunk Assessment Right Lower Extremity Assessment RLE ROM/Strength/Tone: Deficits RLE ROM/Strength/Tone Deficits: Generally weak, 4/5 RLE Sensation: WFL - Light Touch Left Lower Extremity Assessment LLE ROM/Strength/Tone: Deficits LLE ROM/Strength/Tone Deficits: Generally weak, 4/5 LLE Sensation: WFL - Light Touch Trunk Assessment Trunk Assessment: Normal   Balance Balance Balance Assessed: Yes Static Standing Balance Static Standing - Balance Support: During functional activity;Right upper extremity supported Static Standing - Level of Assistance: 4: Min assist Static Standing - Comment/# of Minutes: pt able to stand at toilet to urinate with MinA to maintain balance.    End of Session PT - End of Session Equipment Utilized During Treatment: Gait belt Activity Tolerance: Patient tolerated treatment well Patient left: in chair;with call bell/phone within reach;with restraints reapplied;with nursing in room Nurse Communication: Mobility status  GP     Sunny Schlein, [place] [phone] 06/23/2012, 12:07 PM

## 2012-06-24 LAB — CBC
HCT: 43.3 % (ref 39.0–52.0)
Hemoglobin: 15.5 g/dL (ref 13.0–17.0)
MCH: 32.8 pg (ref 26.0–34.0)
MCHC: 35.8 g/dL (ref 30.0–36.0)

## 2012-06-24 LAB — GLUCOSE, CAPILLARY: Glucose-Capillary: 95 mg/dL (ref 70–99)

## 2012-06-24 LAB — BASIC METABOLIC PANEL
BUN: 16 mg/dL (ref 6–23)
GFR calc non Af Amer: >90 mL/min (ref >90)
Glucose, Bld: 113 mg/dL — ABNORMAL HIGH (ref 70–99)
Potassium: 3.6 mEq/L (ref 3.5–5.1)

## 2012-06-24 MED ORDER — LORAZEPAM 2 MG/ML IJ SOLN
INTRAMUSCULAR | Status: AC
  Filled 2012-06-24: qty 1

## 2012-06-24 MED ORDER — RISPERIDONE 0.5 MG PO TABS
0.5000 mg | ORAL_TABLET | Freq: Two times a day (BID) | ORAL | Status: DC
  Administered 2012-06-24 – 2012-06-25 (×2): 0.5 mg via ORAL
  Filled 2012-06-24 (×3): qty 1

## 2012-06-24 MED ORDER — SODIUM CHLORIDE 0.9 % IJ SOLN
INTRAMUSCULAR | Status: AC
  Administered 2012-06-24: 14:00:00
  Filled 2012-06-24: qty 10

## 2012-06-24 MED ORDER — METOPROLOL TARTRATE 50 MG PO TABS
50.0000 mg | ORAL_TABLET | Freq: Two times a day (BID) | ORAL | Status: DC
  Administered 2012-06-24 – 2012-06-25 (×2): 50 mg via ORAL
  Filled 2012-06-24 (×3): qty 1

## 2012-06-24 MED ORDER — FOLIC ACID 1 MG PO TABS
1.0000 mg | ORAL_TABLET | Freq: Every day | ORAL | Status: DC
  Administered 2012-06-24 – 2012-06-25 (×2): 1 mg via ORAL
  Filled 2012-06-24 (×2): qty 1

## 2012-06-24 MED ORDER — ENSURE PUDDING PO PUDG
1.0000 | Freq: Three times a day (TID) | ORAL | Status: DC
  Administered 2012-06-24: 1 via ORAL

## 2012-06-24 MED ORDER — VITAMIN B-1 100 MG PO TABS
100.0000 mg | ORAL_TABLET | Freq: Every day | ORAL | Status: DC
  Administered 2012-06-24 – 2012-06-25 (×2): 100 mg via ORAL
  Filled 2012-06-24 (×2): qty 1

## 2012-06-24 MED ORDER — LORAZEPAM 2 MG/ML IJ SOLN
0.5000 mg | INTRAMUSCULAR | Status: DC | PRN
  Administered 2012-06-24: 0.5 mg via INTRAVENOUS

## 2012-06-24 NOTE — Progress Notes (Addendum)
[facility] Progress Note [place] TEAM 1 - Stepdown/ICU TEAM   Mark [name] ZOX:[id number] DOB: 1965/06/09 DOA: 06/09/2012 PCP: Mark Boys, MD  Brief narrative: 47 y/o with history of ETOH abuse, HTN, dilated cardiomyopathy, and anxiety admitted from the ED on 4/1 with headache x 1 week, diffuse abdominal pain x 5 days and chronic worsening chest pain. [name] were elevated but had been trending down. Abd [place] was negative. Cardiac biomarkers negative. Last drink on 4/1 12AM. Patient had been on CIWA protocol since admission but had become increasingly confused and combative despite treatment. The patient was not able to be managed on the floor and was admitted to the MICU for management of withdraw.   SIGNIFICANT EVENTS / STUDIES:  4/1 Echo - Mild LVH. Systolic function normal. The estimated EF 55-60%.  4/3 intubation  4/7: self extubated and re- intubated  4/11: started on ativan gtt  4/12: Extubated  4/13: Precedex and Ativan prn  4/14: Precedex and Ativan. Didn't tolerate off Precedex.  4/15: Precedex, improved clinically , off now  Assessment/Plan:  History of dilated cardiomyopathy secondary to alcohol abuse Appears to be clinically compensated at this time  Prolonged QTc > 550, patient had been on Methadone >> resolved  Hypokalemia Resolved with replacement  Abdominal pain w/ elevated LFTs Likely secondary to alcoholic hepatic disease - Abd [place] negative except for fatty infiltrate of liver - Viral hepatitis panel negative - symptoms resolved by history today  Probable LLL HCAP Completed 7 days of zosyn tx  Acute encephalopathy due to EtOH w/d - Hx of ETOH withdrawal seizures Now off precedex - appears to be stabilizing neurologically - continue to follow  H/O ETOH abuse Clinical social work to discuss treatment options with patient  Mark [name], oral Was tx w/ short course of diflucan  Acute respiratory failure Due to EtOH withdrawal - now resolved -  extubated 4/12  Code Status: FULL Family Communication: No family present at time of exam Disposition Plan: Transfer to tele bed - CIR rejected patient - need to determine if SNF is necessary  Consultants: PCCM  Antibiotics: Diflucan 4/1 >> 4/3  Vanc 4/7>> 4/12  Zosyn 4/7>> 4/13  DVT prophylaxis: Subcutaneous heparin  HPI/Subjective: The patient is awake alert and interactive.  He remains quite hoarse.  He denies chest pain abdominal pain shortness of breath or fever/chills.  He states that he feels quite unsteady when attempting to walk.   Objective: Blood pressure 156/94, pulse 123, temperature 99.4 F (37.4 C), temperature source Oral, resp. rate 18, height 6\' 4"  (1.93 m), weight 84.3 kg (185 lb 13.6 oz), SpO2 96.00%.  Intake/Output Summary (Last 24 hours) at 06/24/12 1426 Last data filed at 06/24/12 1344  Gross per 24 hour  Intake     23 ml  Output      0 ml  Net     23 ml     Exam: General: No acute respiratory distress - hoarse voice Lungs: Clear to auscultation bilaterally without wheezes or crackles Cardiovascular: Tachycardic but regular without appreciable murmur gallop or rub Abdomen: Nontender, nondistended, soft, bowel sounds positive, no rebound, no ascites, no appreciable mass Extremities: No significant cyanosis, clubbing, or edema bilateral lower extremities  Data Reviewed: Basic Metabolic Panel:  Recent Labs Lab 06/19/12 0400 06/20/12 0420 06/21/12 0500 06/22/12 0430 06/23/12 0730 06/24/12 0522  NA 139 137 139 138 139 141  K 3.1* 3.4* 3.8 3.6 3.7 3.6  CL 103 102 100 99 103 104  CO2 28  27 28 27 24 22   GLUCOSE 181* 187* 114* 114* 107* 113*  BUN 12 12 11 18 16 16   CREATININE 0.65 0.59 0.64 0.76 0.68 0.81  CALCIUM 9.5 9.1 9.9 9.8 9.6 10.1  MG 2.2  --  2.4  --   --   --   PHOS 4.5  --  4.5  --   --   --    Liver Function Tests:  Recent Labs Lab 06/21/12 0500  AST 33  ALT 46  ALKPHOS 145*  BILITOT 0.5  PROT 7.1  ALBUMIN 3.0*    CBC:  Recent Labs Lab 06/19/12 0400 06/20/12 0420 06/21/12 0500 06/23/12 0730 06/24/12 0522  WBC 12.4* 13.7* 18.5* 13.6* 16.3*  HGB 13.2 12.7* 14.0 13.8 15.5  HCT 36.9* 37.2* 39.4 38.8* 43.3  MCV 90.4 91.9 89.3 89.4 91.5  PLT 490* 525* 547* 553* 598*   BNP (last 3 results)  Recent Labs  01/19/12 1905 06/11/12 1645 06/12/12 0445  PROBNP 135.3* 4164.0* 1122.0*   CBG:  Recent Labs Lab 06/23/12 0822 06/23/12 1210 06/23/12 1519 06/24/12 0814 06/24/12 1142  GLUCAP 87 112* 130* 101* 126*    Recent Results (from the past 240 hour(s))  CULTURE, BLOOD (ROUTINE X 2)     Status: None   Collection Time    06/15/12  9:30 AM      Result Value Range Status   Specimen Description BLOOD FOREARM LEFT   Final   Special Requests BOTTLES DRAWN AEROBIC AND ANAEROBIC 10CC   Final   Culture  Setup Time 06/15/2012 14:20   Final   Culture NO GROWTH 5 DAYS   Final   Report Status 06/21/2012 FINAL   Final  CULTURE, BLOOD (ROUTINE X 2)     Status: None   Collection Time    06/15/12  9:35 AM      Result Value Range Status   Specimen Description BLOOD RIGHT ANTECUBITAL   Final   Special Requests BOTTLES DRAWN AEROBIC AND ANAEROBIC 10CC   Final   Culture  Setup Time 06/15/2012 14:21   Final   Culture NO GROWTH 5 DAYS   Final   Report Status 06/21/2012 FINAL   Final  CULTURE, RESPIRATORY (NON-EXPECTORATED)     Status: None   Collection Time    06/15/12 11:30 AM      Result Value Range Status   Specimen Description TRACHEAL ASPIRATE   Final   Special Requests Normal   Final   Gram Stain     Final   Value: ABUNDANT WBC PRESENT, PREDOMINANTLY PMN     FEW SQUAMOUS EPITHELIAL CELLS PRESENT     FEW GRAM POSITIVE COCCI     IN PAIRS FEW GRAM POSITIVE RODS     RARE GRAM NEGATIVE RODS   Culture Non-Pathogenic Oropharyngeal-type Flora Isolated.   Final   Report Status 06/18/2012 FINAL   Final     Studies:  Recent x-ray studies have been reviewed in detail by the Attending  Physician  Scheduled Meds:  Scheduled Meds: . antiseptic oral rinse  15 mL Mouth Rinse QID  . aspirin  325 mg Oral Daily   Or  . aspirin  300 mg Rectal Daily  . chlorhexidine  15 mL Mouth Rinse BID  . cloNIDine  0.1 mg Oral TID  . feeding supplement  1 Container Oral TID BM  . heparin  5,000 Units Subcutaneous Q8H  . LORazepam  0.5 mg Oral Q6H  . metoprolol tartrate  25 mg Oral BID  .  multivitamin with minerals  1 tablet Oral Daily  . nicotine  7 mg Transdermal Daily  . pantoprazole (PROTONIX) IV  40 mg Intravenous Q24H  . risperiDONE  1 mg Oral BID  . thiamine  100 mg Intravenous Daily   Continuous Infusions: . sodium chloride 20 mL/hr at 06/23/12 0656  . dexmedetomidine 0.6 mcg/kg/hr (06/23/12 0929)    Time spent on care of this patient:   [facility] T  [facility] Office  [phone] Pager - Text Page per Loretha Stapler as per below:  On-Call/Text Page:      [email or web address]      password [email or web address]  If 7PM-7AM, please contact night-coverage [email or web address] Password [email or web address] 06/24/2012, 2:26 PM   LOS: 15 days

## 2012-06-24 NOTE — Progress Notes (Signed)
NUTRITION FOLLOW UP  Intervention:    Ensure Pudding po TID to maximize oral intake; each supplement provides 170 kcal and 4 grams of protein.   Nutrition Dx:   Inadequate oral intake related to dysphagia as evidenced by minimal intake of meals. Ongoing.  Goal:  Intake to meet >90% of estimated nutrition needs. Unmet.  Monitor:  PO intake, weight trend, labs.   Assessment:   Patient was extubated on 4/12.  Continues to go through ETOH withdrawal.  TF off since extubation. Diet advanced to Dysphagia 3 with nectar thick liquids.  SLP is following for treatment.  Per discussion with nurse, patient ate a minimal amount of breakfast.  Had diarrhea yesterday.  Poor appetite.   Height: Ht Readings from Last 1 Encounters:  06/11/12 6\' 4"  (1.93 m)    Weight Status:   Wt Readings from Last 1 Encounters:  06/24/12 185 lb 13.6 oz (84.3 kg)  06/19/12  201 lb 1 oz (91.2 kg)  06/11/12  202 lb 13.2 oz (92 kg)   Weight down with negative fluid balance.  Body mass index is 22.63 kg/(m^2).  Re-estimated needs:  Kcal: 2200-2400 Protein: 100-120 gm Fluid: 2.3-2.5 L  Skin: no problems noted  Diet Order: Dysphagia 3 with nectar thick liquids   Intake/Output Summary (Last 24 hours) at 06/24/12 0956 Last data filed at 06/24/12 0830  Gross per 24 hour  Intake     70 ml  Output     80 ml  Net    -10 ml    Last BM: 4/15   Labs:   Recent Labs Lab 06/17/12 1130  06/19/12 0400  06/21/12 0500 06/22/12 0430 06/23/12 0730 06/24/12 0522  NA 139  < > 139  < > 139 138 139 141  K 3.0*  < > 3.1*  < > 3.8 3.6 3.7 3.6  CL 101  < > 103  < > 100 99 103 104  CO2 26  < > 28  < > 28 27 24 22   BUN 12  < > 12  < > 11 18 16 16   CREATININE 0.66  < > 0.65  < > 0.64 0.76 0.68 0.81  CALCIUM 9.2  < > 9.5  < > 9.9 9.8 9.6 10.1  MG 2.1  --  2.2  --  2.4  --   --   --   PHOS  --   --  4.5  --  4.5  --   --   --   GLUCOSE 138*  < > 181*  < > 114* 114* 107* 113*  < > = values in this interval not  displayed.  CBG (last 3)   Recent Labs  06/23/12 1210 06/23/12 1519 06/24/12 0814  GLUCAP 112* 130* 101*    Scheduled Meds: . antiseptic oral rinse  15 mL Mouth Rinse QID  . aspirin  325 mg Oral Daily   Or  . aspirin  300 mg Rectal Daily  . chlorhexidine  15 mL Mouth Rinse BID  . cloNIDine  0.1 mg Oral TID  . heparin  5,000 Units Subcutaneous Q8H  . LORazepam  0.5 mg Oral Q6H  . metoprolol tartrate  25 mg Oral BID  . multivitamin with minerals  1 tablet Oral Daily  . nicotine  7 mg Transdermal Daily  . pantoprazole (PROTONIX) IV  40 mg Intravenous Q24H  . risperiDONE  1 mg Oral BID  . thiamine  100 mg Intravenous Daily    Continuous Infusions: .  sodium chloride 20 mL/hr at 06/23/12 0656  . dexmedetomidine 0.6 mcg/kg/hr (06/23/12 0929)    Joaquin Courts, RD, LDN, CNSC Pager [phone] After Hours Pager [phone]

## 2012-06-24 NOTE — Progress Notes (Signed)
Patient being transferred to unit 4700 per MD order. Report called to Freida Busman, RN, patient will travel in wheel chair.

## 2012-06-24 NOTE — Progress Notes (Signed)
Thank you for consult on Mark [name]. Patient current issues are due to alcohol withdrawal symptoms. Agitation resolved and expect MS to improve with time. Would recommend alcohol rehab for follow up once stable for discharge. He's not appropriate for CIR.

## 2012-06-24 NOTE — Progress Notes (Signed)
Clinical Child psychotherapist (CSW) informed that pt mother requesting to speak with CSW regarding dc needs. CSW visited pt room and received consent to speak with pt mother. CSW left a message for pt mother on her home and cell phone.  Theresia Bough, MSW, Theresia Majors [phone]

## 2012-06-24 NOTE — Progress Notes (Signed)
Clinical Child psychotherapist (CSW) received a returned call from pt mother. CSW explored mothers concerns and/or needs. Mother inquired whether pt would qualify for Medicaid/Disability or medication assistance as mother plans to care for pt at discharge. CSW informed mother that CSW has contacted the financial counselor to review pt chart and determine whether pt could qualify for a Medicaid or Disability application. CSW also informed mother that CSW would provide resources on how to apply for either service and would inform the unit RNCM of pt need for medication assistance. Mother was very thankful and appreciative. Mother also informed CSW that she would highly prefer that pt be admitted into CIR before discharging home. Pt mother plans to be main caretaker for pt at discharge. Pt denied additional concerns or needs. CSW signed off.  (Resources have been placed in pt room and RNCM has been notified of medication assistance.) Theresia Bough, MSW, [employer] [phone]

## 2012-06-25 LAB — CBC
Hemoglobin: 15 g/dL (ref 13.0–17.0)
MCHC: 35.2 g/dL (ref 30.0–36.0)
Platelets: 563 10*3/uL — ABNORMAL HIGH (ref 150–400)
RDW: 14.3 % (ref 11.5–15.5)

## 2012-06-25 LAB — BASIC METABOLIC PANEL
GFR calc Af Amer: >90 mL/min (ref >90)
GFR calc non Af Amer: >90 mL/min (ref >90)
Glucose, Bld: 110 mg/dL — ABNORMAL HIGH (ref 70–99)
Potassium: 3.7 mEq/L (ref 3.5–5.1)
Sodium: 139 mEq/L (ref 135–145)

## 2012-06-25 LAB — GLUCOSE, CAPILLARY: Glucose-Capillary: 101 mg/dL — ABNORMAL HIGH (ref 70–99)

## 2012-06-25 MED ORDER — METOPROLOL TARTRATE 50 MG PO TABS: 50.0000 mg | ORAL_TABLET | Freq: Two times a day (BID) | ORAL | Status: DC

## 2012-06-25 MED ORDER — CLONIDINE HCL 0.1 MG PO TABS: 0.1000 mg | ORAL_TABLET | Freq: Three times a day (TID) | ORAL | Status: DC

## 2012-06-25 MED ORDER — LORAZEPAM 0.5 MG PO TABS: 0.5000 mg | ORAL_TABLET | Freq: Three times a day (TID) | ORAL | Status: DC | PRN

## 2012-06-25 MED ORDER — RISPERIDONE 0.5 MG PO TABS: 0.5000 mg | ORAL_TABLET | Freq: Two times a day (BID) | ORAL | Status: DC

## 2012-06-25 MED ORDER — FOLIC ACID 1 MG PO TABS: 1.0000 mg | ORAL_TABLET | Freq: Every day | ORAL | Status: DC

## 2012-06-25 MED ORDER — ADULT MULTIVITAMIN W/MINERALS CH: 1.0000 | ORAL_TABLET | Freq: Every day | ORAL | Status: DC

## 2012-06-25 NOTE — Progress Notes (Signed)
Physical Therapy Treatment Patient Details Name: Mark [name] MRN: [id number] DOB: 11-08-1965 Today's Date: 06/25/2012 Time: 9147-8295 PT Time Calculation (min): 15 min  PT Assessment / Plan / Recommendation Comments on Treatment Session  Patient moving well only required some verbal cues to slow down to ensure safety.  Patient reports that he moves at fast pace at baseline.    Patient has been observed ambulating hallway independently.  Patient reports he is back at baseline with mobility.  Patient independent at this time.  Therefore PT services are signing off.  Patient recommends patient discharge home when medically ready.  No PT follow up recommended.     Follow Up Recommendations  No PT Follow Up.     Does the patient have the potential to tolerate intense rehabilitation     Barriers to Discharge        Equipment Recommendations  Rolling walker with 5" wheels    Recommendations for Other Services    Frequency Min 3X/week   Plan      Precautions / Restrictions Precautions Precautions: Fall Restrictions Weight Bearing Restrictions: No   Pertinent Vitals/Pain no apparent distress     Mobility  Bed Mobility Bed Mobility: Supine to Sit;Sitting - Scoot to Edge of Bed Supine to Sit: 6: Modified independent (Device/Increase time) Sitting - Scoot to Edge of Bed: 6: Modified independent (Device/Increase time) Details for Bed Mobility Assistance: cues to slow down and for safety.   Transfers Transfers: Stand to Sit;Sit to Stand Sit to Stand: With upper extremity assist;From bed;6: Modified independent (Device/Increase time) Stand to Sit: 4: Mod It;With upper extremity assist;To chair/3-in-1 Details for Transfer Assistance: Patient stated that he had regained balance after standing.  Patient stood quickly before assistance for balance in place.   Ambulation/Gait Ambulation/Gait Assistance:Independent Ambulation Distance (Feet): 300 Feet Assistive device: Rolling  walker/None Ambulation/Gait Assistance Details: Cues for posture when using walker; cues to slow down gait speed.  Patient steady no loss of balance. Gait Pattern: Step-through pattern;Decreased stride length;Trunk flexed Stairs: No    Exercises General Exercises - Lower Extremity Hip Flexion/Marching: AROM;Both;10 reps (Patient quick to accomplish.  Cues to slow for balance.) Mini-Sqauts: AROM;Both;10 reps    PT Goals Acute Rehab PT Goals PT Goal: Supine/Side to Sit - Progress: Met PT Goal: Sit to Supine/Side - Progress: Met PT Goal: Sit to Stand - Progress: Met PT Goal: Stand to Sit - Progress: Met PT Goal: Ambulate - Progress: Met  Visit Information  Last PT Received On: 06/25/12    Subjective Data      Cognition  Cognition Arousal/Alertness: Awake/alert Behavior During Therapy: Restless Overall Cognitive Status: Within Functional Limits for tasks assessed Safety/Judgement: Decreased awareness of safety    Balance     End of Session PT - End of Session Equipment Utilized During Treatment: Gait belt Patient left: in chair;with call bell/phone within reach;with chair alarm set   GP     Maximino Greenland JEAN 06/25/2012, 12:02 PM

## 2012-06-25 NOTE — Discharge Summary (Signed)
Physician Discharge Summary  Mark [name] BJY:[id number] DOB: [birth year] DOA: 06/09/2012  PCP: Mark Boys, MD  Admit date: 06/09/2012 Discharge date: 06/25/2012  Time spent: 35 minutes  Recommendations for Outpatient Follow-up:  1. AA for alcohol treatment  Discharge Diagnoses:  Active Problems:   Alcoholic hepatitis   Acute alcoholic intoxication   H/O ETOH abuse   Cardiomyopathy, dilated, nonischemic   Transaminitis   Hypokalemia   Acute encephalopathy   Thrush, oral   Acute respiratory failure   Discharge Condition: improved  Diet recommendation: cardiac  Filed Weights   06/24/12 0500 06/24/12 1825 06/25/12 0424  Weight: 84.3 kg (185 lb 13.6 oz) 84.2 kg (185 lb 10 oz) 84.233 kg (185 lb 11.2 oz)    History of present illness:  This is a 47 year old male, with known history of tobacco abuse, HTN, dyslipidemia, ETOH abuse, possible opioid dependency, chronic back/neck pain, s/p back surgery, s/p cervical spine surgery, non-ischemic/Dilated cardiomyopathy, EF 25% on 01/21/2012, under care of Dr Mark [name], recurrent chest pain, s/p negative ETT 01/20/13. Patient is a vague historian. According to him, he has had upper abdominal pain for the last 4-5 days, with occasional diarrhea, and daily retching/heaving and occasional bilious vomitus. He continues to have recurrent bilateral chest pain, but no fever, chills. He has a mild cough, productive of whitish phlegm, and is occasionally SOBOE. He continues to drink ETOH on a daily basis. Last ETOH was imbibed at midnight on 06/09/12.    Hospital Course:  History of dilated cardiomyopathy secondary to alcohol abuse  Appears to be clinically compensated at this time   Prolonged QTc > 550, patient had been on Methadone >> resolved  Hypokalemia  Resolved with replacement   Abdominal pain w/ elevated LFTs  Likely secondary to alcoholic hepatic disease - Abd [place] negative except for fatty infiltrate of liver - Viral hepatitis  panel negative - symptoms resolved by history today   Probable LLL HCAP  Completed 7 days of zosyn tx   Acute encephalopathy due to EtOH w/d - Hx of ETOH withdrawal seizures  Now off precedex - stable- no signs of alcohol withdrawal  H/O ETOH abuse  Clinical social work to discuss treatment options with patient   Mark [name], oral  Was tx w/ short course of diflucan   Acute respiratory failure  Due to EtOH withdrawal - now resolved - extubated 4/12   Procedures:  intubation  Consultations:  pccm  Discharge Exam: Filed Vitals:   06/24/12 2137 06/24/12 2138 06/25/12 0424 06/25/12 1058  BP: 146/88 146/88 144/81 135/88  Pulse: 94  74 96  Temp:   98.2 F (36.8 C)   TempSrc:   Oral   Resp:   19   Height:      Weight:   84.233 kg (185 lb 11.2 oz)   SpO2:   95%     General: A+Ox3, NAD Cardiovascular: rrr Respiratory: clear anterior  Discharge Instructions      Discharge Orders   Future Orders Complete By Expires     Diet - low sodium heart healthy  As directed     Discharge instructions  As directed     Comments:      AA meetings for help with alcohol abuse recovery    Increase activity slowly  As directed         Medication List    STOP taking these medications       carvedilol 12.5 MG tablet  Commonly known as:  COREG  losartan 100 MG tablet  Commonly known as:  COZAAR     methadone 10 MG tablet  Commonly known as:  DOLOPHINE      TAKE these medications       aspirin 325 MG EC tablet  Take 1 tablet (325 mg total) by mouth daily.     cloNIDine 0.1 MG tablet  Commonly known as:  CATAPRES  Take 1 tablet (0.1 mg total) by mouth 3 (three) times daily.     folic acid 1 MG tablet  Commonly known as:  FOLVITE  Take 1 tablet (1 mg total) by mouth daily.     LORazepam 0.5 MG tablet  Commonly known as:  ATIVAN  Take 1 tablet (0.5 mg total) by mouth every 8 (eight) hours as needed for anxiety.     metoprolol 50 MG tablet  Commonly known as:   LOPRESSOR  Take 1 tablet (50 mg total) by mouth 2 (two) times daily.     multivitamin with minerals Tabs  Take 1 tablet by mouth daily.     risperiDONE 0.5 MG tablet  Commonly known as:  RISPERDAL  Take 1 tablet (0.5 mg total) by mouth 2 (two) times daily.       Follow-up Information   Follow up with FRIED, ROBERT L, MD In 1 week.   Contact information:   [facility] [address] [place] [address] [phone]        The results of significant diagnostics from this hospitalization (including imaging, microbiology, ancillary and laboratory) are listed below for reference.    Significant Diagnostic Studies: Dg Chest 1 View  06/09/2012  *RADIOLOGY REPORT*  Clinical Data: Chest pain  CHEST - 1 VIEW  Comparison: 01/19/2012, 03/07/2012  Findings: normal heart size and vascularity.  Minimal left base scarring.  No focal pneumonia, collapse, consolidation, edema, or pneumothorax.  Trachea midline.  No effusion.  Lower cervical fusion hardware noted with broken screws  IMPRESSION: Stable chest exam.  No superimposed acute process   Original Report Authenticated By: Judie Petit. Miles Costain, M.D.    US Abdomen Complete  06/10/2012  *RADIOLOGY REPORT*  Clinical Data: Transaminase.  Ethanol abuse.  ABDOMEN ULTRASOUND  Technique:  Complete abdominal ultrasound examination was performed including evaluation of the liver, gallbladder, bile ducts, pancreas, kidneys, spleen, IVC, and abdominal aorta.  Comparison: CT scan from 01/20/2012.  Findings:  Gallbladder:  There is no evidence for gallstones.  No gallbladder wall thickening or pericholecystic fluid.  The sonographer reports no sonographic Murphy's sign.  Common Bile Duct:  Nondilated at 5 mm diameter.  Liver:  Diffuse coarsening of the hepatic echotexture is consistent with fatty infiltration.  No intrahepatic biliary duct dilatation.  IVC:  Obscured by overlying bowel gas.  Pancreas:  Obscured by overlying bowel gas.  Spleen:  Normal.  Right kidney:  13.4 cm in long axis.  Normal.   Left kidney:  13.1 cm in long axis.  Normal.  Abdominal Aorta:  The visualized portions show no evidence for aneurysm.  IMPRESSION: Fatty changes in the liver.  Otherwise unremarkable.   Original Report Authenticated By: Kennith Center, M.D.    Dg Chest Port 1 View  06/21/2012  *RADIOLOGY REPORT*  Clinical Data: Follow-up pneumonia.  PORTABLE CHEST - 1 VIEW  Comparison: 06/19/2012  Findings: Nasogastric tube is no longer seen.  Right jugular center venous catheter remains in appropriate position.  Mild right basilar infiltrate or atelectasis shows no significant change.  Left lung remains clear.  Heart size is stable.  IMPRESSION: Mild right  basilar atelectasis or infiltrate, without significant change.   Original Report Authenticated By: Myles Rosenthal, M.D.    Dg Chest Port 1 View  06/19/2012  *RADIOLOGY REPORT*  Clinical Data: Pneumonia  PORTABLE CHEST - 1 VIEW  Comparison: 06/16/2012; 06/09/2012; 12/09/2011; chest CT - 12/09/2011  Findings: Grossly unchanged cardiac silhouette and mediastinal contours.  Stable position of support apparatus.  No pneumothorax. Improved aeration of the bilateral lung bases with persistent bilateral medial basilar heterogeneous / consolidative opacity.  No new focal airspace opacities.  No definite pleural effusion. Unchanged bones including lower cervical ACDF, incompletely evaluated.  IMPRESSION: 1.  Stable positioning of support apparatus.  No pneumothorax. 2.  Improved aeration of the lungs with persistent bilateral medial basilar airspace opacities, atelectasis versus infiltrate.   Original Report Authenticated By: Tacey Ruiz, MD    Dg Chest Port 1 View  06/16/2012  *RADIOLOGY REPORT*  Clinical Data: Sputum production.  PORTABLE CHEST - 1 VIEW  Comparison: 06/15/2012  Findings: Endotracheal tube is 4.9 cm above the carina. Nasogastric tube extends into the abdomen.  Right jugular central venous catheter in the SVC region.  There are increased densities at the left lung  base.  Linear densities in the right chest are suggestive for atelectasis.  Postsurgical changes in the cervical spine.  IMPRESSION: Increased densities at the left lung base are concerning for pleural fluid and consolidation.  Support apparatuses as described.   Original Report Authenticated By: Richarda Overlie, M.D.    Dg Chest Port 1 View  06/15/2012  *RADIOLOGY REPORT*  Clinical Data: Reintubation  PORTABLE CHEST - 1 VIEW  Comparison: Portable chest x-ray of 06/14/2012  Findings: The tip of the endotracheal tube is approximately 4 cm above the carina.  Mild basilar atelectasis is present.  A right IJ central venous line tip overlies the lower SVC near the expected right atrial junction.  Heart size is stable and an NG tube is present.  IMPRESSION: . 1.  Tip of endotracheal tube approximately 4 cm above the carina. 2.  Mild basilar atelectasis. 3.  No change in position of the right IJ central venous line.   Original Report Authenticated By: Dwyane Dee, M.D.    Dg Chest Port 1 View  06/14/2012  *RADIOLOGY REPORT*  Clinical Data: Respiratory failure.  PORTABLE CHEST - 1 VIEW  Comparison: 06/11/2012  Findings: Endotracheal tube remains with the tip approximately 4 cm above the carina.  Central line and nasogastric tube positioning are stable.  Lungs show relatively stable aeration with minimal bibasilar atelectasis present.  No focal consolidation or pulmonary edema is identified.  There is no significant pleural fluid. Stable mild cardiac enlargement.  IMPRESSION: Minimal bibasilar atelectasis.   Original Report Authenticated By: Irish Lack, M.D.    Dg Chest Port 1 View  06/11/2012  *RADIOLOGY REPORT*  Clinical Data: Central line placement  PORTABLE CHEST - 1 VIEW  Comparison: 06/11/2012  Findings: Right jugular catheter placement in the SVC.  No pneumothorax.  NG tube in place with the tip not visualized.  Endotracheal tube approximately 2 cm above the carina.  Mild atelectasis in the lung bases.  Negative  for heart failure.  ACDF of the cervical spine.  The proximal screws in the fusion plate are both fractured.  IMPRESSION: Satisfactory central venous catheter placement with mild bibasilar atelectasis.   Original Report Authenticated By: Janeece Riggers, M.D.    Dg Chest Port 1 View  06/11/2012  *RADIOLOGY REPORT*  Clinical Data: Intubation  PORTABLE CHEST - 1  VIEW  Comparison: 06/09/2012  Findings: Endotracheal tube in good position.  NG tube in the stomach.  Mild atelectasis in the lung bases.  Lungs are otherwise clear. There is no heart failure or effusion.  IMPRESSION: Endotracheal tube in good position.  Mild atelectasis in the lung bases.   Original Report Authenticated By: Janeece Riggers, M.D.    Dg Abd Portable 1v  06/14/2012  *RADIOLOGY REPORT*  Clinical Data: Abdominal pain.  PORTABLE ABDOMEN - 1 VIEW  Comparison: 06/11/2012  Findings: Bowel gas pattern remains unremarkable with no evidence of bowel obstruction, ileus or abnormal calcification.  Bony structures are stable in appearance.  IMPRESSION: Normal bowel gas pattern.   Original Report Authenticated By: Irish Lack, M.D.    Dg Abd Portable 1v  06/11/2012  *RADIOLOGY REPORT*  Clinical Data: Gastric tube insertion  PORTABLE ABDOMEN - 1 VIEW  Comparison: 01/19/2012  Findings: Gastric tube in the body of the stomach.  Gas is present in nondilated large and small bowel.  This may represent mild ileus.  IMPRESSION: Gastric tube in the body of the stomach.   Original Report Authenticated By: Janeece Riggers, M.D.     Microbiology: No results found for this or any previous visit (from the past 240 hour(s)).   Labs: Basic Metabolic Panel:  Recent Labs Lab 06/19/12 0400  06/21/12 0500 06/22/12 0430 06/23/12 0730 06/24/12 0522 06/25/12 0525  NA 139  < > 139 138 139 141 139  K 3.1*  < > 3.8 3.6 3.7 3.6 3.7  CL 103  < > 100 99 103 104 101  CO2 28  < > 28 27 24 22 26   GLUCOSE 181*  < > 114* 114* 107* 113* 110*  BUN 12  < > 11 18 16 16 15    CREATININE 0.65  < > 0.64 0.76 0.68 0.81 0.87  CALCIUM 9.5  < > 9.9 9.8 9.6 10.1 10.0  MG 2.2  --  2.4  --   --   --  2.4  PHOS 4.5  --  4.5  --   --   --  4.1  < > = values in this interval not displayed. Liver Function Tests:  Recent Labs Lab 06/21/12 0500  AST 33  ALT 46  ALKPHOS 145*  BILITOT 0.5  PROT 7.1  ALBUMIN 3.0*   No results found for this basename: LIPASE, AMYLASE,  in the last 168 hours No results found for this basename: AMMONIA,  in the last 168 hours CBC:  Recent Labs Lab 06/20/12 0420 06/21/12 0500 06/23/12 0730 06/24/12 0522 06/25/12 0525  WBC 13.7* 18.5* 13.6* 16.3* 14.5*  HGB 12.7* 14.0 13.8 15.5 15.0  HCT 37.2* 39.4 38.8* 43.3 42.6  MCV 91.9 89.3 89.4 91.5 90.6  PLT 525* 547* 553* 598* 563*   Cardiac Enzymes: No results found for this basename: CKTOTAL, CKMB, CKMBINDEX, TROPONINI,  in the last 168 hours BNP: BNP (last 3 results)  Recent Labs  01/19/12 1905 06/11/12 1645 06/12/12 0445  PROBNP 135.3* 4164.0* 1122.0*   CBG:  Recent Labs Lab 06/23/12 1519 06/24/12 0814 06/24/12 1142 06/24/12 1553 06/24/12 2141  GLUCAP 130* 101* 126* 95 101*       Signed:  Yahye Siebert  [facility] 06/25/2012, 3:57 PM

## 2012-06-25 NOTE — Progress Notes (Signed)
Speech Language Pathology Dysphagia Treatment Patient Details Name: Mark [name] MRN: [id number] DOB: Jun 18, 1965 Today's Date: 06/25/2012 Time: 3086-5784 SLP Time Calculation (min): 12 min  Assessment / Plan / Recommendation Clinical Impression  Though pt is persistently aphonic, there is no longer any overt evidence of aspiration with thin liquids. Pt initally demonstrated hard cough with any sip of water. He has been drinking thin liquids at bedside despite recommendations and is likely going to d/c soon. Pt may consume a regular diet and thin liquids. Suggest referral to ENT as an outpatient if vocal quality does not improve in the next week. SLP will sign off.     Diet Recommendation  Initiate / Change Diet: Regular;Thin liquid    SLP Plan All goals met   Pertinent Vitals/Pain NA   Swallowing Goals  SLP Swallowing Goals Patient will utilize recommended strategies during swallow to increase swallowing safety with: Minimal cueing Swallow Study Goal #2 - Progress: Met Goal #3: Pt will consume trials of thin liquids without wet vocal quality or cough/throat clear in better than 90% of small sips.  Swallow Study Goal #3 - Progress: Met  General Temperature Spikes Noted: No Respiratory Status: Supplemental O2 delivered via (comment) Behavior/Cognition: Alert;Cooperative Oral Cavity - Dentition: Adequate natural dentition Patient Positioning: Upright in bed  Oral Cavity - Oral Hygiene     Dysphagia Treatment Treatment focused on: Skilled observation of diet tolerance;Upgraded PO texture trials Treatment Methods/Modalities: Skilled observation Patient observed directly with PO's: Yes Type of PO's observed: Thin liquids Feeding: Able to feed self Liquids provided via: Cup;Straw Type of cueing: Verbal Amount of cueing: Minimal   GO    Mark Ditty, MA CCC-SLP [phone]  Mark [name] 06/25/2012, 12:16 PM

## 2012-06-25 NOTE — Progress Notes (Signed)
Agree with PTA.    Enisa Runyan, PT [phone]  

## 2012-06-25 NOTE — Progress Notes (Signed)
06/25/12 1245 In to speak with pt. about the [facility] program for his medication needs.  This NCM explained to pt. to use the card within 7days of discharge, that he has to use one of the specific pharmacies listed on the [facility] card, and that he can only use the MATCH once a fiscial year.  Pt. stated he understood.  Pt. to dc home today. Tera Mater, RN, BSN NCM [phone]

## 2012-06-25 NOTE — Progress Notes (Signed)
Pt d/c home with mother. D.c instructions and medications reviewed with Pt. Pt states understanding. All Pt questions answered. PCP appt med 4-24 1115am

## 2012-10-14 ENCOUNTER — Emergency Department (HOSPITAL_COMMUNITY): Payer: Self-pay

## 2012-10-14 ENCOUNTER — Encounter (HOSPITAL_COMMUNITY): Payer: Self-pay | Admitting: Emergency Medicine

## 2012-10-14 ENCOUNTER — Emergency Department (HOSPITAL_COMMUNITY)
Admission: EM | Admit: 2012-10-14 | Discharge: 2012-10-14 | Disposition: A | Discharge status: Home / Self Care | Payer: Self-pay | Attending: Emergency Medicine | Admitting: Emergency Medicine

## 2012-10-14 DIAGNOSIS — R51 Headache: Secondary | ICD-10-CM | POA: Insufficient documentation

## 2012-10-14 DIAGNOSIS — F1010 Alcohol abuse, uncomplicated: Secondary | ICD-10-CM | POA: Insufficient documentation

## 2012-10-14 DIAGNOSIS — R0602 Shortness of breath: Secondary | ICD-10-CM | POA: Insufficient documentation

## 2012-10-14 DIAGNOSIS — F17200 Nicotine dependence, unspecified, uncomplicated: Secondary | ICD-10-CM | POA: Insufficient documentation

## 2012-10-14 DIAGNOSIS — R079 Chest pain, unspecified: Secondary | ICD-10-CM

## 2012-10-14 DIAGNOSIS — Z8679 Personal history of other diseases of the circulatory system: Secondary | ICD-10-CM | POA: Insufficient documentation

## 2012-10-14 DIAGNOSIS — R0789 Other chest pain: Secondary | ICD-10-CM | POA: Insufficient documentation

## 2012-10-14 DIAGNOSIS — R109 Unspecified abdominal pain: Secondary | ICD-10-CM | POA: Insufficient documentation

## 2012-10-14 DIAGNOSIS — Z8719 Personal history of other diseases of the digestive system: Secondary | ICD-10-CM | POA: Insufficient documentation

## 2012-10-14 DIAGNOSIS — Z7982 Long term (current) use of aspirin: Secondary | ICD-10-CM | POA: Insufficient documentation

## 2012-10-14 DIAGNOSIS — Z9889 Other specified postprocedural states: Secondary | ICD-10-CM | POA: Insufficient documentation

## 2012-10-14 DIAGNOSIS — Z8639 Personal history of other endocrine, nutritional and metabolic disease: Secondary | ICD-10-CM | POA: Insufficient documentation

## 2012-10-14 DIAGNOSIS — Z8659 Personal history of other mental and behavioral disorders: Secondary | ICD-10-CM | POA: Insufficient documentation

## 2012-10-14 DIAGNOSIS — I10 Essential (primary) hypertension: Secondary | ICD-10-CM | POA: Insufficient documentation

## 2012-10-14 DIAGNOSIS — R000 Tachycardia, unspecified: Secondary | ICD-10-CM | POA: Insufficient documentation

## 2012-10-14 DIAGNOSIS — R059 Cough, unspecified: Secondary | ICD-10-CM | POA: Insufficient documentation

## 2012-10-14 DIAGNOSIS — Z862 Personal history of diseases of the blood and blood-forming organs and certain disorders involving the immune mechanism: Secondary | ICD-10-CM | POA: Insufficient documentation

## 2012-10-14 DIAGNOSIS — R05 Cough: Secondary | ICD-10-CM | POA: Insufficient documentation

## 2012-10-14 DIAGNOSIS — Z79899 Other long term (current) drug therapy: Secondary | ICD-10-CM | POA: Insufficient documentation

## 2012-10-14 DIAGNOSIS — G8929 Other chronic pain: Secondary | ICD-10-CM | POA: Insufficient documentation

## 2012-10-14 LAB — CBC WITH DIFFERENTIAL/PLATELET
Basophils Absolute: 0 10*3/uL (ref 0.0–0.1)
Lymphocytes Relative: 20 % (ref 12–46)
Neutro Abs: 5.8 10*3/uL (ref 1.7–7.7)
Neutrophils Relative %: 74 % (ref 43–77)
Platelets: 320 10*3/uL (ref 150–400)
RDW: 13 % (ref 11.5–15.5)
WBC: 7.8 10*3/uL (ref 4.0–10.5)

## 2012-10-14 LAB — COMPREHENSIVE METABOLIC PANEL
ALT: 18 U/L (ref 0–53)
AST: 24 U/L (ref 0–37)
CO2: 26 mEq/L (ref 19–32)
Calcium: 9.5 mg/dL (ref 8.4–10.5)
Chloride: 96 mEq/L (ref 96–112)
GFR calc non Af Amer: >90 mL/min (ref >90)
Potassium: 3.1 mEq/L — ABNORMAL LOW (ref 3.5–5.1)
Sodium: 139 mEq/L (ref 135–145)
Total Bilirubin: 0.5 mg/dL (ref 0.3–1.2)

## 2012-10-14 LAB — POCT I-STAT TROPONIN I: Troponin i, poc: 0.01 ng/mL (ref 0.00–0.08)

## 2012-10-14 MED ORDER — SODIUM CHLORIDE 0.9 % IV SOLN: 1000.0000 mL | INTRAVENOUS | Status: DC

## 2012-10-14 MED ORDER — IOHEXOL 300 MG/ML  SOLN: 50.0000 mL | Freq: Once | INTRAMUSCULAR | Status: DC | PRN

## 2012-10-14 MED ORDER — SODIUM CHLORIDE 0.9 % IV SOLN
1000.0000 mL | Freq: Once | INTRAVENOUS | Status: AC
  Administered 2012-10-14: 1000 mL via INTRAVENOUS

## 2012-10-14 MED ORDER — METOCLOPRAMIDE HCL 5 MG/ML IJ SOLN
10.0000 mg | Freq: Once | INTRAMUSCULAR | Status: AC
  Administered 2012-10-14: 10 mg via INTRAVENOUS
  Filled 2012-10-14: qty 2

## 2012-10-14 MED ORDER — GI COCKTAIL ~~LOC~~
30.0000 mL | Freq: Once | ORAL | Status: AC
  Administered 2012-10-14: 30 mL via ORAL
  Filled 2012-10-14: qty 30

## 2012-10-14 MED ORDER — IOHEXOL 300 MG/ML  SOLN
100.0000 mL | Freq: Once | INTRAMUSCULAR | Status: AC | PRN
  Administered 2012-10-14: 100 mL via INTRAVENOUS

## 2012-10-14 MED ORDER — ONDANSETRON HCL 4 MG/2ML IJ SOLN
4.0000 mg | Freq: Once | INTRAMUSCULAR | Status: AC
  Administered 2012-10-14: 4 mg via INTRAVENOUS
  Filled 2012-10-14: qty 2

## 2012-10-14 NOTE — ED Notes (Signed)
Patient transported to X-ray 

## 2012-10-14 NOTE — ED Notes (Signed)
MD at bedside. 

## 2012-10-14 NOTE — ED Provider Notes (Signed)
CSN: [id number]     Arrival date & time 10/14/12  1350 History     First MD Initiated Contact with Patient 10/14/12 1420     Chief Complaint  Patient presents with  . Chest Pain   (Consider location/radiation/quality/duration/timing/severity/associated sxs/prior Treatment) HPI 47 y.o. Male with complaints of left anterior chest pain for several days squeezing, popping, and skipping.  States he has had an irregular heart beat.  Pain has been constant for months.  No dyspnea.  Patient had some alcohol this am and has been drinking daily for 3-4 days.  States he just wants the pain to stop.  Patient states sob, cough, does not have a definite fever, but "my whole body burns and I can't feel my feet.  The headache is the worst which comes out of my neck.  It never stops." Denies trauma, or lateralized weakness.  States taking only tramadol.  NPO today except for a little water. " I haven't eaten in three days, because my stomach hurts." Past Medical History  Diagnosis Date  . Hypertension   . Hyperlipidemia   . 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 ETOH abuse   . GERD (gastroesophageal reflux disease)   . Mental disorder   . Irregular heart beat   . Anxiety   . Shortness of breath   . Headache(784.0)   . Hepatitis   . Withdrawal seizures   . Alcohol withdrawal    Past Surgical History  Procedure Laterality Date  . Back surgery    . Neck surgery    . Fundoplasty transthoracic  2003  . Nasal sinus surgery     Family History  Problem Relation Age of Onset  . Breast cancer Paternal Grandmother   . Lung cancer      was a smoker  . Hypertension Mother    History  Substance Use Topics  . Smoking status: Current Every Day Smoker -- 2.00 packs/day for 30 years    Types: Cigarettes  . Smokeless tobacco: Never Used  . Alcohol Use: 4.2 oz/week    7 Shots of liquor per week     Comment: drinks 1pint + each day    Review of Systems  All  other systems reviewed and are negative.    Allergies  Lisinopril  Home Medications  States taking only tramadol Current Outpatient Rx  Name  Route  Sig  Dispense  Refill  . traMADol (ULTRAM) 50 MG tablet   Oral   Take 50 mg by mouth every 6 (six) hours as needed for pain.         Marland Kitchen aspirin 325 MG EC tablet   Oral   Take 325 mg by mouth daily.         . cloNIDine (CATAPRES) 0.1 MG tablet   Oral   Take 1 tablet (0.1 mg total) by mouth 3 (three) times daily.   90 tablet   0   . folic acid (FOLVITE) 1 MG tablet   Oral   Take 1 tablet (1 mg total) by mouth daily.         Marland Kitchen LORazepam (ATIVAN) 0.5 MG tablet   Oral   Take 1 tablet (0.5 mg total) by mouth every 8 (eight) hours as needed for anxiety.   15 tablet   0   . metoprolol (LOPRESSOR) 50 MG tablet   Oral   Take 1 tablet (50 mg total) by mouth 2 (two) times  daily.   60 tablet   0   . Multiple Vitamin (MULTIVITAMIN WITH MINERALS) TABS   Oral   Take 1 tablet by mouth daily.         . risperiDONE (RISPERDAL) 0.5 MG tablet   Oral   Take 1 tablet (0.5 mg total) by mouth 2 (two) times daily.   60 tablet   0    BP 147/106  Pulse 109  Temp(Src) 98 F (36.7 C)  Resp 16  Wt 200 lb (90.719 kg)  BMI 24.35 kg/m2  SpO2 94% Physical Exam  Nursing note and vitals reviewed. Constitutional: He is oriented to person, place, and time. He appears well-developed and well-nourished.  HENT:  Head: Normocephalic and atraumatic.  Eyes: EOM are normal. Pupils are equal, round, and reactive to light.  Neck: Normal range of motion.  Cardiovascular: Tachycardia present.   Pulmonary/Chest: Effort normal and breath sounds normal.  Abdominal: There is tenderness.  Musculoskeletal: Normal range of motion.  Neurological: He is alert and oriented to person, place, and time. He has normal reflexes.  Skin: Skin is warm and dry.  Psychiatric: He has a normal mood and affect. His behavior is normal. Judgment and thought content  normal.    ED Course   Procedures (including critical care time)  Labs Reviewed - No data to display No results found. No diagnosis found.  Date: 10/14/2012  Rate: 119  Rhythm: sinus tachycardia  QRS Axis: normal  Intervals: normal  ST/T Wave abnormalities: normal  Conduction Disutrbances:none  Narrative Interpretation:   Old EKG Reviewed: changes noted t wave inversion has resolved from prior ekg of 06/25/12  Results for orders placed during the hospital encounter of 10/14/12  CBC WITH DIFFERENTIAL      Result Value Range   WBC 7.8  4.0 - 10.5 K/uL   RBC 6.14 (*) 4.22 - 5.81 MIL/uL   Hemoglobin 17.3 (*) 13.0 - 17.0 g/dL   HCT 16.1  09.6 - 04.5 %   MCV 79.5  78.0 - 100.0 fL   MCH 28.2  26.0 - 34.0 pg   MCHC 35.5  30.0 - 36.0 g/dL   RDW 40.9  81.1 - 91.4 %   Platelets 320  150 - 400 K/uL   Neutrophils Relative % 74  43 - 77 %   Neutro Abs 5.8  1.7 - 7.7 K/uL   Lymphocytes Relative 20  12 - 46 %   Lymphs Abs 1.5  0.7 - 4.0 K/uL   Monocytes Relative 6  3 - 12 %   Monocytes Absolute 0.5  0.1 - 1.0 K/uL   Eosinophils Relative 0  0 - 5 %   Eosinophils Absolute 0.0  0.0 - 0.7 K/uL   Basophils Relative 0  0 - 1 %   Basophils Absolute 0.0  0.0 - 0.1 K/uL  COMPREHENSIVE METABOLIC PANEL      Result Value Range   Sodium 139  135 - 145 mEq/L   Potassium 3.1 (*) 3.5 - 5.1 mEq/L   Chloride 96  96 - 112 mEq/L   CO2 26  19 - 32 mEq/L   Glucose, Bld 108 (*) 70 - 99 mg/dL   BUN 5 (*) 6 - 23 mg/dL   Creatinine, Ser 7.82  0.50 - 1.35 mg/dL   Calcium 9.5  8.4 - 95.6 mg/dL   Total Protein 7.5  6.0 - 8.3 g/dL   Albumin 4.0  3.5 - 5.2 g/dL   AST 24  0 - 37 U/L  ALT 18  0 - 53 U/L   Alkaline Phosphatase 166 (*) 39 - 117 U/L   Total Bilirubin 0.5  0.3 - 1.2 mg/dL   GFR calc non Af Amer >90  >90 mL/min   GFR calc Af Amer >90  >90 mL/min  LIPASE, BLOOD      Result Value Range   Lipase 27  11 - 59 U/L  ETHANOL      Result Value Range   Alcohol, Ethyl (B) 206 (*) 0 - 11 mg/dL  POCT  I-STAT TROPONIN I      Result Value Range   Troponin i, poc 0.01  0.00 - 0.08 ng/mL   Comment 3             Dg Chest 2 View  10/14/2012   *RADIOLOGY REPORT*  Clinical Data: Abdominal pain  CHEST - 2 VIEW  Comparison: 06/21/2012  Findings: Cardiomediastinal silhouette is stable.  No acute infiltrate or pleural effusion.  No pulmonary edema.  Mild hyperinflation. Metallic fixation material cervical spine again noted.  Stable mild degenerative changes mid thoracic spine.  IMPRESSION: No active disease.  Mild hyperinflation.   Original Report Authenticated By: Natasha Mead, M.D.   Ct Abdomen Pelvis W Contrast  10/14/2012   *RADIOLOGY REPORT*  Clinical Data: Abdominal pain  CT ABDOMEN AND PELVIS WITH CONTRAST  Technique:  Multidetector CT imaging of the abdomen and pelvis was performed following the standard protocol during bolus administration of intravenous contrast.  Contrast: OMNIPAQUE IOHEXOL 300 MG/ML  SOLN  Comparison: 01/20/2012  Findings: Lung bases are unremarkable.  Old right lower anterior rib fractures are noted.  The sagittal images of the spine shows stable postsurgical changes at L5-S1 level disc space.  Fatty infiltration of the liver.  No calcified gallstones are noted within gallbladder.  Enhanced pancreas, spleen and adrenal glands are unremarkable.  Mild atherosclerotic calcifications of the abdominal aorta.  No aortic aneurysm.  Enhanced kidneys are symmetrical in size.  Surgical clips are noted in GE junction region probable prior fundoplication.  No hydronephrosis or hydroureter.  Delayed renal images shows bilateral renal symmetrical excretion.  No small bowel obstruction.  No ascites or free air.  No adenopathy.  There is no pericecal inflammation.  The terminal ileum is unremarkable.  Normal appendix is clearly visualized in axial image 65.  No distal colonic obstruction.  Prostate gland seminal vesicles are unremarkable.  Some gas and stool noted within rectum.  No destructive bony  lesions are noted within pelvis.  No pelvic ascites or adenopathy.  No inguinal adenopathy.  Small bilateral inguinal canal hernia containing fat without evidence of acute complication.  Probable bone island right proximal femur measures 9 mm stable in size and appearance from prior exam.  IMPRESSION:  1.  No acute inflammatory process within abdomen or pelvis. 2. No hydronephrosis or hydroureter.  Bilateral renal symmetrical excretion.  3.  Fatty infiltration of the liver. 4.  Normal appendix.  No pericecal inflammation. 5.  Stable postsurgical changes at L5 S1 disc space.   Original Report Authenticated By: Natasha Mead, M.D.    MDM  47 y.o. Male presents today complaining of pain in multiple sites.  He has an elevated etoh level and labs normal except alk phos elevated.  Patient initially complained of chest pain and has nonischemic ekg with normal troponin with several days to weeks of pain.  Patient previously on methadone with last prescribed in [facility] for 120 pills. Patient refuses to drink oral contrast until  given pain medicine.  Patient given iv fluids and hr decreased to 90s.  Patient advised to have scan.   Discussed results with patient and discussed chronic pain issues.  Patient is discharged in improved condition.  He will follow up with Dr. Foy Gu[place].   Hilario Quarry, MD 10/14/12 [phone]

## 2012-10-14 NOTE — Progress Notes (Signed)
[facility] provided patient with a [facility] orange card application and a list of primary care resources.

## 2012-10-14 NOTE — ED Notes (Signed)
Pt alert, arrives from home, c/o Chest Pain, SOB, onset was several days ago, describes pain as a sharp "moving around pain", resp even unlabored, skin pwd

## 2012-10-14 NOTE — ED Notes (Signed)
Pt returns from xray, MD at bedside.

## 2012-11-07 ENCOUNTER — Emergency Department (HOSPITAL_COMMUNITY): Payer: Self-pay

## 2012-11-07 ENCOUNTER — Encounter (HOSPITAL_COMMUNITY): Payer: Self-pay | Admitting: Emergency Medicine

## 2012-11-07 ENCOUNTER — Emergency Department (HOSPITAL_COMMUNITY)
Admission: EM | Admit: 2012-11-07 | Discharge: 2012-11-08 | Disposition: A | Discharge status: Home / Self Care | Payer: Self-pay | Attending: Emergency Medicine | Admitting: Emergency Medicine

## 2012-11-07 DIAGNOSIS — Z862 Personal history of diseases of the blood and blood-forming organs and certain disorders involving the immune mechanism: Secondary | ICD-10-CM | POA: Insufficient documentation

## 2012-11-07 DIAGNOSIS — R071 Chest pain on breathing: Secondary | ICD-10-CM | POA: Insufficient documentation

## 2012-11-07 DIAGNOSIS — F1010 Alcohol abuse, uncomplicated: Secondary | ICD-10-CM | POA: Insufficient documentation

## 2012-11-07 DIAGNOSIS — Z7982 Long term (current) use of aspirin: Secondary | ICD-10-CM | POA: Insufficient documentation

## 2012-11-07 DIAGNOSIS — Z79899 Other long term (current) drug therapy: Secondary | ICD-10-CM | POA: Insufficient documentation

## 2012-11-07 DIAGNOSIS — I10 Essential (primary) hypertension: Secondary | ICD-10-CM | POA: Insufficient documentation

## 2012-11-07 DIAGNOSIS — Z8639 Personal history of other endocrine, nutritional and metabolic disease: Secondary | ICD-10-CM | POA: Insufficient documentation

## 2012-11-07 DIAGNOSIS — Z87828 Personal history of other (healed) physical injury and trauma: Secondary | ICD-10-CM | POA: Insufficient documentation

## 2012-11-07 DIAGNOSIS — Z8719 Personal history of other diseases of the digestive system: Secondary | ICD-10-CM | POA: Insufficient documentation

## 2012-11-07 DIAGNOSIS — F17200 Nicotine dependence, unspecified, uncomplicated: Secondary | ICD-10-CM | POA: Insufficient documentation

## 2012-11-07 DIAGNOSIS — Z8659 Personal history of other mental and behavioral disorders: Secondary | ICD-10-CM | POA: Insufficient documentation

## 2012-11-07 DIAGNOSIS — F10929 Alcohol use, unspecified with intoxication, unspecified: Secondary | ICD-10-CM

## 2012-11-07 DIAGNOSIS — Z9889 Other specified postprocedural states: Secondary | ICD-10-CM | POA: Insufficient documentation

## 2012-11-07 DIAGNOSIS — Z8679 Personal history of other diseases of the circulatory system: Secondary | ICD-10-CM | POA: Insufficient documentation

## 2012-11-07 DIAGNOSIS — F411 Generalized anxiety disorder: Secondary | ICD-10-CM | POA: Insufficient documentation

## 2012-11-07 DIAGNOSIS — G8929 Other chronic pain: Secondary | ICD-10-CM | POA: Insufficient documentation

## 2012-11-07 DIAGNOSIS — M549 Dorsalgia, unspecified: Secondary | ICD-10-CM | POA: Insufficient documentation

## 2012-11-07 DIAGNOSIS — I498 Other specified cardiac arrhythmias: Secondary | ICD-10-CM | POA: Insufficient documentation

## 2012-11-07 LAB — CBC WITH DIFFERENTIAL/PLATELET
Eosinophils Absolute: 0.1 10*3/uL (ref 0.0–0.7)
Hemoglobin: 17.2 g/dL — ABNORMAL HIGH (ref 13.0–17.0)
Lymphs Abs: 2.3 10*3/uL (ref 0.7–4.0)
MCH: 28.1 pg (ref 26.0–34.0)
Monocytes Relative: 5 % (ref 3–12)
Neutro Abs: 4.2 10*3/uL (ref 1.7–7.7)
Neutrophils Relative %: 61 % (ref 43–77)
RBC: 6.12 MIL/uL — ABNORMAL HIGH (ref 4.22–5.81)

## 2012-11-07 LAB — BASIC METABOLIC PANEL
BUN: 8 mg/dL (ref 6–23)
CO2: 26 mEq/L (ref 19–32)
Chloride: 103 mEq/L (ref 96–112)
Glucose, Bld: 103 mg/dL — ABNORMAL HIGH (ref 70–99)
Potassium: 3.6 mEq/L (ref 3.5–5.1)

## 2012-11-07 MED ORDER — KETOROLAC TROMETHAMINE 60 MG/2ML IM SOLN
60.0000 mg | Freq: Once | INTRAMUSCULAR | Status: AC
  Administered 2012-11-07: 60 mg via INTRAMUSCULAR
  Filled 2012-11-07: qty 2

## 2012-11-07 MED ORDER — DEXTROSE 5 % IV SOLN
500.0000 mg | Freq: Once | INTRAVENOUS | Status: AC
  Administered 2012-11-07: 500 mg via INTRAVENOUS
  Filled 2012-11-07: qty 5

## 2012-11-07 MED ORDER — SODIUM CHLORIDE 0.9 % IV BOLUS (SEPSIS)
1000.0000 mL | Freq: Once | INTRAVENOUS | Status: AC
Start: 2012-11-07 — End: 2012-11-08
  Administered 2012-11-07: 1000 mL via INTRAVENOUS

## 2012-11-07 MED ORDER — METHOCARBAMOL 100 MG/ML IJ SOLN
500.0000 mg | Freq: Once | INTRAMUSCULAR | Status: DC
  Filled 2012-11-07 (×2): qty 5

## 2012-11-07 NOTE — ED Notes (Signed)
I-stat Troponin: 0.01  Reported to Dr.Wentz
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
## 2012-11-08 LAB — POCT I-STAT TROPONIN I

## 2012-11-08 MED ORDER — TRAMADOL HCL 50 MG PO TABS
50.0000 mg | ORAL_TABLET | Freq: Four times a day (QID) | ORAL | Status: DC | PRN
Start: 2012-11-08 — End: 2012-11-08

## 2012-11-08 NOTE — ED Notes (Signed)
0140  Went in to check on the pt and the pt stated he wanted some ativan and I asked what for and he stated the pain.  I let the provider know what he asked for.

## 2012-11-08 NOTE — ED Provider Notes (Signed)
Medical screening examination/treatment/procedure(s) were performed by non-physician practitioner and as supervising physician I was immediately available for consultation/collaboration.  Janilah Hojnacki L Michelina Mexicano, MD 11/08/12 2350 

## 2012-11-08 NOTE — ED Notes (Signed)
Ambulates well with assistance

## 2012-12-07 ENCOUNTER — Ambulatory Visit: Payer: Self-pay

## 2012-12-28 ENCOUNTER — Encounter: Payer: Self-pay | Admitting: Internal Medicine

## 2012-12-28 ENCOUNTER — Ambulatory Visit: Payer: No Typology Code available for payment source | Attending: Internal Medicine | Admitting: Internal Medicine

## 2012-12-28 ENCOUNTER — Telehealth: Payer: Self-pay | Admitting: Emergency Medicine

## 2012-12-28 VITALS — BP 172/110 | HR 121 | Temp 98.6°F | Resp 16 | Ht 76.0 in | Wt 197.0 lb

## 2012-12-28 DIAGNOSIS — I472 Ventricular tachycardia, unspecified: Secondary | ICD-10-CM | POA: Insufficient documentation

## 2012-12-28 DIAGNOSIS — F1011 Alcohol abuse, in remission: Secondary | ICD-10-CM

## 2012-12-28 DIAGNOSIS — G894 Chronic pain syndrome: Secondary | ICD-10-CM

## 2012-12-28 DIAGNOSIS — F1010 Alcohol abuse, uncomplicated: Secondary | ICD-10-CM | POA: Insufficient documentation

## 2012-12-28 DIAGNOSIS — F489 Nonpsychotic mental disorder, unspecified: Secondary | ICD-10-CM | POA: Insufficient documentation

## 2012-12-28 DIAGNOSIS — K219 Gastro-esophageal reflux disease without esophagitis: Secondary | ICD-10-CM | POA: Insufficient documentation

## 2012-12-28 DIAGNOSIS — I4729 Other ventricular tachycardia: Secondary | ICD-10-CM | POA: Insufficient documentation

## 2012-12-28 DIAGNOSIS — I428 Other cardiomyopathies: Secondary | ICD-10-CM | POA: Insufficient documentation

## 2012-12-28 DIAGNOSIS — G8929 Other chronic pain: Secondary | ICD-10-CM

## 2012-12-28 DIAGNOSIS — M545 Low back pain, unspecified: Secondary | ICD-10-CM | POA: Insufficient documentation

## 2012-12-28 DIAGNOSIS — Z79899 Other long term (current) drug therapy: Secondary | ICD-10-CM | POA: Insufficient documentation

## 2012-12-28 DIAGNOSIS — E785 Hyperlipidemia, unspecified: Secondary | ICD-10-CM | POA: Insufficient documentation

## 2012-12-28 DIAGNOSIS — R52 Pain, unspecified: Secondary | ICD-10-CM

## 2012-12-28 DIAGNOSIS — Z0000 Encounter for general adult medical examination without abnormal findings: Secondary | ICD-10-CM | POA: Insufficient documentation

## 2012-12-28 DIAGNOSIS — I10 Essential (primary) hypertension: Secondary | ICD-10-CM

## 2012-12-28 DIAGNOSIS — R197 Diarrhea, unspecified: Secondary | ICD-10-CM | POA: Insufficient documentation

## 2012-12-28 DIAGNOSIS — F17200 Nicotine dependence, unspecified, uncomplicated: Secondary | ICD-10-CM

## 2012-12-28 DIAGNOSIS — F411 Generalized anxiety disorder: Secondary | ICD-10-CM | POA: Insufficient documentation

## 2012-12-28 DIAGNOSIS — F1020 Alcohol dependence, uncomplicated: Secondary | ICD-10-CM | POA: Insufficient documentation

## 2012-12-28 DIAGNOSIS — K7010 Alcoholic hepatitis without ascites: Secondary | ICD-10-CM | POA: Insufficient documentation

## 2012-12-28 HISTORY — DX: Other chronic pain: G89.29

## 2012-12-28 LAB — LIPASE: Lipase: 22 U/L (ref 0–75)

## 2012-12-28 LAB — AMYLASE: Amylase: 26 U/L (ref 0–105)

## 2012-12-28 MED ORDER — CLONIDINE HCL 0.1 MG PO TABS
0.1000 mg | ORAL_TABLET | Freq: Once | ORAL | Status: AC
  Administered 2012-12-28: 0.1 mg via ORAL

## 2012-12-28 MED ORDER — GABAPENTIN 100 MG PO CAPS: 100.0000 mg | ORAL_CAPSULE | Freq: Three times a day (TID) | ORAL | Status: DC

## 2012-12-28 MED ORDER — ACETAMINOPHEN-CODEINE #3 300-30 MG PO TABS: 1.0000 | ORAL_TABLET | ORAL | Status: DC | PRN

## 2012-12-28 MED ORDER — PREGABALIN 100 MG PO CAPS: 100.0000 mg | ORAL_CAPSULE | Freq: Two times a day (BID) | ORAL | Status: DC

## 2012-12-28 NOTE — Patient Instructions (Signed)
Chronic Back Pain  When back pain lasts longer than 3 months, it is called chronic back pain.People with chronic back pain often go through certain periods that are more intense (flare-ups).  CAUSES Chronic back pain can be caused by wear and tear (degeneration) on different structures in your back. These structures include:  The bones of your spine (vertebrae) and the joints surrounding your spinal cord and nerve roots (facets).  The strong, fibrous tissues that connect your vertebrae (ligaments). Degeneration of these structures may result in pressure on your nerves. This can lead to constant pain. HOME CARE INSTRUCTIONS  Avoid bending, heavy lifting, prolonged sitting, and activities which make the problem worse.  Take brief periods of rest throughout the day to reduce your pain. Lying down or standing usually is better than sitting while you are resting.  Take over-the-counter or prescription medicines only as directed by your caregiver. SEEK IMMEDIATE MEDICAL CARE IF:   You have weakness or numbness in one of your legs or feet.  You have trouble controlling your bladder or bowels.  You have nausea, vomiting, abdominal pain, shortness of breath, or fainting. Document Released: 04/04/2004 Document Revised: 05/20/2011 Document Reviewed: 02/09/2011 [facility] Patient Information 2014 [place], [place]. Hypertension As your heart beats, it forces blood through your arteries. This force is your blood pressure. If the pressure is too high, it is called hypertension (HTN) or high blood pressure. HTN is dangerous because you may have it and not know it. High blood pressure may mean that your heart has to work harder to pump blood. Your arteries may be narrow or stiff. The extra work puts you at risk for heart disease, stroke, and other problems.  Blood pressure consists of two numbers, a higher number over a lower, 110/72, for example. It is stated as "110 over 72." The ideal is below 120 for the  top number (systolic) and under 80 for the bottom (diastolic). Write down your blood pressure today. You should pay close attention to your blood pressure if you have certain conditions such as:  Heart failure.  Prior heart attack.  Diabetes  Chronic kidney disease.  Prior stroke.  Multiple risk factors for heart disease. To see if you have HTN, your blood pressure should be measured while you are seated with your arm held at the level of the heart. It should be measured at least twice. A one-time elevated blood pressure reading (especially in the Emergency Department) does not mean that you need treatment. There may be conditions in which the blood pressure is different between your right and left arms. It is important to see your caregiver soon for a recheck. Most people have essential hypertension which means that there is not a specific cause. This type of high blood pressure may be lowered by changing lifestyle factors such as:  Stress.  Smoking.  Lack of exercise.  Excessive weight.  Drug/tobacco/alcohol use.  Eating less salt. Most people do not have symptoms from high blood pressure until it has caused damage to the body. Effective treatment can often prevent, delay or reduce that damage. TREATMENT  When a cause has been identified, treatment for high blood pressure is directed at the cause. There are a large number of medications to treat HTN. These fall into several categories, and your caregiver will help you select the medicines that are best for you. Medications may have side effects. You should review side effects with your caregiver. If your blood pressure stays high after you have made lifestyle  changes or started on medicines,   Your medication(s) may need to be changed.  Other problems may need to be addressed.  Be certain you understand your prescriptions, and know how and when to take your medicine.  Be sure to follow up with your caregiver within the time  frame advised (usually within two weeks) to have your blood pressure rechecked and to review your medications.  If you are taking more than one medicine to lower your blood pressure, make sure you know how and at what times they should be taken. Taking two medicines at the same time can result in blood pressure that is too low. SEEK IMMEDIATE MEDICAL CARE IF:  You develop a severe headache, blurred or changing vision, or confusion.  You have unusual weakness or numbness, or a faint feeling.  You have severe chest or abdominal pain, vomiting, or breathing problems. MAKE SURE YOU:   Understand these instructions.  Will watch your condition.  Will get help right away if you are not doing well or get worse. Document Released: 02/25/2005 Document Revised: 05/20/2011 Document Reviewed: 10/16/2007 [facility] Patient Information 2014 [place], [place].

## 2012-12-28 NOTE — Progress Notes (Signed)
Pt is here today to establish care.  Pt reports having widespread pain and neuropathy for 3 years. Pt has had fusions in the lower back and the neck.  Pt reports having chest congestion. Pt states even after surgery he still dealing with GERD.

## 2012-12-28 NOTE — Telephone Encounter (Signed)
Pt called requesting alternate medication for Lyrica. States the cost was $300. Pt has Gabapentin ordered. Will need to take that
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
## 2012-12-29 ENCOUNTER — Telehealth: Payer: Self-pay | Admitting: Emergency Medicine

## 2012-12-29 NOTE — Telephone Encounter (Signed)
Pt given negative test results 

## 2012-12-29 NOTE — Telephone Encounter (Signed)
Message copied by Darlis Loan on Tue Dec 29, 2012  5:58 PM ------      Message from: Quentin Angst      Created: Tue Dec 29, 2012  3:41 PM       Please inform patient that his pancreatic enzymes are within normal limit ------

## 2012-12-29 NOTE — Telephone Encounter (Signed)
Message copied by Darlis Loan on Tue Dec 29, 2012  5:22 PM ------      Message from: Quentin Angst      Created: Tue Dec 29, 2012  3:41 PM       Please inform patient that his pancreatic enzymes are within normal limit ------

## 2013-01-11 ENCOUNTER — Ambulatory Visit: Payer: No Typology Code available for payment source | Attending: Internal Medicine

## 2013-01-11 VITALS — BP 143/94 | HR 85 | Temp 99.1°F | Resp 18

## 2013-01-11 DIAGNOSIS — G894 Chronic pain syndrome: Secondary | ICD-10-CM

## 2013-01-11 MED ORDER — ACETAMINOPHEN-CODEINE #3 300-30 MG PO TABS: 1.0000 | ORAL_TABLET | ORAL | Status: DC | PRN

## 2013-01-11 NOTE — Progress Notes (Unsigned)
  Subjective:    Patient ID: Mark [name], male    DOB: 1965-09-18, 47 y.o.   MRN: [id number]  HPI    Review of Systems     Objective:   Physical Exam        Assessment & Plan:  Pt here for BP recheck post HTN episode BP- 143/94 Pt taking prescribed medication daily Requesting Tylenol #3 refill

## 2013-01-14 ENCOUNTER — Other Ambulatory Visit: Payer: Self-pay

## 2013-01-20 ENCOUNTER — Encounter (HOSPITAL_COMMUNITY): Payer: Self-pay | Admitting: Emergency Medicine

## 2013-01-20 ENCOUNTER — Emergency Department (HOSPITAL_COMMUNITY)
Admission: EM | Admit: 2013-01-20 | Discharge: 2013-01-21 | Disposition: A | Discharge status: Home / Self Care | Payer: No Typology Code available for payment source | Attending: Emergency Medicine | Admitting: Emergency Medicine

## 2013-01-20 ENCOUNTER — Emergency Department (HOSPITAL_COMMUNITY): Payer: No Typology Code available for payment source

## 2013-01-20 DIAGNOSIS — IMO0002 Reserved for concepts with insufficient information to code with codable children: Secondary | ICD-10-CM | POA: Insufficient documentation

## 2013-01-20 DIAGNOSIS — R110 Nausea: Secondary | ICD-10-CM | POA: Insufficient documentation

## 2013-01-20 DIAGNOSIS — R350 Frequency of micturition: Secondary | ICD-10-CM | POA: Insufficient documentation

## 2013-01-20 DIAGNOSIS — I10 Essential (primary) hypertension: Secondary | ICD-10-CM | POA: Insufficient documentation

## 2013-01-20 DIAGNOSIS — Z8619 Personal history of other infectious and parasitic diseases: Secondary | ICD-10-CM | POA: Insufficient documentation

## 2013-01-20 DIAGNOSIS — F1020 Alcohol dependence, uncomplicated: Secondary | ICD-10-CM | POA: Insufficient documentation

## 2013-01-20 DIAGNOSIS — Z862 Personal history of diseases of the blood and blood-forming organs and certain disorders involving the immune mechanism: Secondary | ICD-10-CM | POA: Insufficient documentation

## 2013-01-20 DIAGNOSIS — Z79899 Other long term (current) drug therapy: Secondary | ICD-10-CM | POA: Insufficient documentation

## 2013-01-20 DIAGNOSIS — F17200 Nicotine dependence, unspecified, uncomplicated: Secondary | ICD-10-CM | POA: Insufficient documentation

## 2013-01-20 DIAGNOSIS — Z8719 Personal history of other diseases of the digestive system: Secondary | ICD-10-CM | POA: Insufficient documentation

## 2013-01-20 DIAGNOSIS — J690 Pneumonitis due to inhalation of food and vomit: Secondary | ICD-10-CM | POA: Insufficient documentation

## 2013-01-20 DIAGNOSIS — R000 Tachycardia, unspecified: Secondary | ICD-10-CM | POA: Insufficient documentation

## 2013-01-20 DIAGNOSIS — Z8639 Personal history of other endocrine, nutritional and metabolic disease: Secondary | ICD-10-CM | POA: Insufficient documentation

## 2013-01-20 DIAGNOSIS — R259 Unspecified abnormal involuntary movements: Secondary | ICD-10-CM | POA: Insufficient documentation

## 2013-01-20 DIAGNOSIS — R109 Unspecified abdominal pain: Secondary | ICD-10-CM

## 2013-01-20 DIAGNOSIS — R1084 Generalized abdominal pain: Secondary | ICD-10-CM | POA: Insufficient documentation

## 2013-01-20 DIAGNOSIS — Z8669 Personal history of other diseases of the nervous system and sense organs: Secondary | ICD-10-CM | POA: Insufficient documentation

## 2013-01-20 DIAGNOSIS — F411 Generalized anxiety disorder: Secondary | ICD-10-CM | POA: Insufficient documentation

## 2013-01-20 LAB — CBC
HCT: 46.6 % (ref 39.0–52.0)
Hemoglobin: 16.5 g/dL (ref 13.0–17.0)
MCH: 28.2 pg (ref 26.0–34.0)
MCHC: 35.4 g/dL (ref 30.0–36.0)
MCV: 79.7 fL (ref 78.0–100.0)
Platelets: 266 10*3/uL (ref 150–400)
RDW: 15.9 % — ABNORMAL HIGH (ref 11.5–15.5)
WBC: 6.6 10*3/uL (ref 4.0–10.5)

## 2013-01-20 LAB — URINALYSIS, ROUTINE W REFLEX MICROSCOPIC
Bilirubin Urine: NEGATIVE
Glucose, UA: NEGATIVE mg/dL
Hgb urine dipstick: NEGATIVE
Leukocytes, UA: NEGATIVE
Protein, ur: NEGATIVE mg/dL
Specific Gravity, Urine: 1.009 (ref 1.005–1.030)
Urobilinogen, UA: 1 mg/dL (ref 0.0–1.0)
pH: 7 (ref 5.0–8.0)

## 2013-01-20 LAB — COMPREHENSIVE METABOLIC PANEL
ALT: 22 U/L (ref 0–53)
AST: 30 U/L (ref 0–37)
Albumin: 4.3 g/dL (ref 3.5–5.2)
Alkaline Phosphatase: 106 U/L (ref 39–117)
Chloride: 95 mEq/L — ABNORMAL LOW (ref 96–112)
Creatinine, Ser: 0.71 mg/dL (ref 0.50–1.35)
GFR calc non Af Amer: >90 mL/min (ref >90)
Glucose, Bld: 132 mg/dL — ABNORMAL HIGH (ref 70–99)
Potassium: 3.8 mEq/L (ref 3.5–5.1)
Sodium: 139 mEq/L (ref 135–145)
Total Bilirubin: 0.4 mg/dL (ref 0.3–1.2)
Total Protein: 7.8 g/dL (ref 6.0–8.3)

## 2013-01-20 LAB — SALICYLATE LEVEL: Salicylate Lvl: <2 mg/dL — ABNORMAL LOW (ref 2.8–20.0)

## 2013-01-20 LAB — LIPASE, BLOOD: Lipase: 49 U/L (ref 11–59)

## 2013-01-20 LAB — RAPID URINE DRUG SCREEN, HOSP PERFORMED
Barbiturates: NOT DETECTED
Cocaine: NOT DETECTED
Opiates: POSITIVE — AB
Tetrahydrocannabinol: NOT DETECTED

## 2013-01-20 LAB — POCT I-STAT TROPONIN I: Troponin i, poc: 0.01 ng/mL (ref 0.00–0.08)

## 2013-01-20 LAB — ACETAMINOPHEN LEVEL: Acetaminophen (Tylenol), Serum: <15 ug/mL (ref 10–30)

## 2013-01-20 MED ORDER — SODIUM CHLORIDE 0.9 % IV BOLUS (SEPSIS)
500.0000 mL | Freq: Once | INTRAVENOUS | Status: AC
  Administered 2013-01-20: 500 mL via INTRAVENOUS

## 2013-01-20 MED ORDER — METOPROLOL TARTRATE 25 MG PO TABS
50.0000 mg | ORAL_TABLET | Freq: Two times a day (BID) | ORAL | Status: DC
  Administered 2013-01-20: 50 mg via ORAL
  Filled 2013-01-20: qty 2

## 2013-01-20 MED ORDER — LORAZEPAM 1 MG PO TABS
1.0000 mg | ORAL_TABLET | Freq: Three times a day (TID) | ORAL | Status: DC | PRN
  Administered 2013-01-21: 1 mg via ORAL

## 2013-01-20 MED ORDER — ACETAMINOPHEN 325 MG PO TABS: 650.0000 mg | ORAL_TABLET | ORAL | Status: DC | PRN

## 2013-01-20 MED ORDER — CLINDAMYCIN PHOSPHATE 600 MG/50ML IV SOLN
600.0000 mg | Freq: Once | INTRAVENOUS | Status: AC
  Administered 2013-01-20: 600 mg via INTRAVENOUS
  Filled 2013-01-20: qty 50

## 2013-01-20 MED ORDER — AZITHROMYCIN 250 MG PO TABS: 500.0000 mg | ORAL_TABLET | Freq: Every day | ORAL | Status: DC

## 2013-01-20 MED ORDER — ONDANSETRON 4 MG PO TBDP
8.0000 mg | ORAL_TABLET | Freq: Once | ORAL | Status: AC
  Administered 2013-01-20: 8 mg via ORAL
  Filled 2013-01-20: qty 2

## 2013-01-20 MED ORDER — CLINDAMYCIN HCL 150 MG PO CAPS: 450.0000 mg | ORAL_CAPSULE | Freq: Three times a day (TID) | ORAL | Status: DC

## 2013-01-20 MED ORDER — NICOTINE 21 MG/24HR TD PT24: 21.0000 mg | MEDICATED_PATCH | Freq: Every day | TRANSDERMAL | Status: DC

## 2013-01-20 MED ORDER — DEXTROSE 5 % IV SOLN
1.0000 g | Freq: Once | INTRAVENOUS | Status: AC
  Administered 2013-01-20: 1 g via INTRAVENOUS

## 2013-01-20 MED ORDER — GI COCKTAIL ~~LOC~~: 30.0000 mL | Freq: Once | ORAL | Status: DC

## 2013-01-20 MED ORDER — AZITHROMYCIN 250 MG PO TABS: 250.0000 mg | ORAL_TABLET | Freq: Every day | ORAL | Status: DC

## 2013-01-20 MED ORDER — CLINDAMYCIN HCL 300 MG PO CAPS
450.0000 mg | ORAL_CAPSULE | Freq: Three times a day (TID) | ORAL | Status: DC
  Administered 2013-01-20: 450 mg via ORAL
  Filled 2013-01-20 (×2): qty 1

## 2013-01-20 MED ORDER — CLONIDINE HCL 0.1 MG PO TABS: 0.1000 mg | ORAL_TABLET | Freq: Every day | ORAL | Status: DC | PRN

## 2013-01-20 MED ORDER — ONDANSETRON HCL 4 MG PO TABS: 4.0000 mg | ORAL_TABLET | Freq: Three times a day (TID) | ORAL | Status: DC | PRN

## 2013-01-20 MED ORDER — LORAZEPAM 1 MG PO TABS
1.0000 mg | ORAL_TABLET | Freq: Once | ORAL | Status: AC
  Administered 2013-01-20: 1 mg via ORAL
  Filled 2013-01-20: qty 1

## 2013-01-20 MED ORDER — GABAPENTIN 100 MG PO CAPS
100.0000 mg | ORAL_CAPSULE | Freq: Three times a day (TID) | ORAL | Status: DC
  Administered 2013-01-20: 100 mg via ORAL
  Filled 2013-01-20: qty 1

## 2013-01-20 MED ORDER — LORAZEPAM 1 MG PO TABS
0.0000 mg | ORAL_TABLET | Freq: Four times a day (QID) | ORAL | Status: DC
  Administered 2013-01-20: 2 mg via ORAL
  Filled 2013-01-20: qty 2

## 2013-01-20 MED ORDER — LORAZEPAM 1 MG PO TABS: 0.0000 mg | ORAL_TABLET | Freq: Two times a day (BID) | ORAL | Status: DC

## 2013-01-20 MED ORDER — ZOLPIDEM TARTRATE 5 MG PO TABS: 5.0000 mg | ORAL_TABLET | Freq: Every evening | ORAL | Status: DC | PRN

## 2013-01-20 MED ORDER — GI COCKTAIL ~~LOC~~
30.0000 mL | Freq: Once | ORAL | Status: AC
  Administered 2013-01-20: 30 mL via ORAL
  Filled 2013-01-20: qty 30

## 2013-01-20 MED ORDER — CEFTRIAXONE SODIUM 1 G IJ SOLR
1.0000 g | Freq: Once | INTRAMUSCULAR | Status: DC
  Filled 2013-01-20: qty 10

## 2013-01-20 MED ORDER — ALUM & MAG HYDROXIDE-SIMETH 200-200-20 MG/5ML PO SUSP: 30.0000 mL | ORAL | Status: DC | PRN

## 2013-01-20 MED ORDER — THIAMINE HCL 100 MG/ML IJ SOLN: 100.0000 mg | Freq: Every day | INTRAMUSCULAR | Status: DC

## 2013-01-20 MED ORDER — AZITHROMYCIN 250 MG PO TABS
500.0000 mg | ORAL_TABLET | Freq: Once | ORAL | Status: AC
  Administered 2013-01-20: 500 mg via ORAL
  Filled 2013-01-20: qty 2

## 2013-01-20 MED ORDER — IOHEXOL 300 MG/ML  SOLN
100.0000 mL | Freq: Once | INTRAMUSCULAR | Status: AC | PRN
  Administered 2013-01-20: 100 mL via INTRAVENOUS

## 2013-01-20 MED ORDER — VITAMIN B-1 100 MG PO TABS: 100.0000 mg | ORAL_TABLET | Freq: Every day | ORAL | Status: DC

## 2013-01-20 MED ORDER — IBUPROFEN 200 MG PO TABS: 600.0000 mg | ORAL_TABLET | Freq: Three times a day (TID) | ORAL | Status: DC | PRN

## 2013-01-20 MED ORDER — LORAZEPAM 2 MG/ML IJ SOLN: 1.0000 mg | Freq: Four times a day (QID) | INTRAMUSCULAR | Status: DC | PRN

## 2013-01-20 MED ORDER — ADULT MULTIVITAMIN W/MINERALS CH: 1.0000 | ORAL_TABLET | Freq: Every day | ORAL | Status: DC

## 2013-01-20 MED ORDER — LORAZEPAM 2 MG/ML IJ SOLN: 1.0000 mg | Freq: Once | INTRAMUSCULAR | Status: DC

## 2013-01-20 MED ORDER — LORAZEPAM 1 MG PO TABS
1.0000 mg | ORAL_TABLET | Freq: Four times a day (QID) | ORAL | Status: DC | PRN
  Filled 2013-01-20: qty 1

## 2013-01-20 MED ORDER — FOLIC ACID 1 MG PO TABS: 1.0000 mg | ORAL_TABLET | Freq: Every day | ORAL | Status: DC

## 2013-01-20 NOTE — BH Assessment (Signed)
Spoke with patient's nurse Jody who agreed to have patient sign voluntary consent form. TTS faxed a blank form to [phone].  Glorious Peach, MS, LCASA Assessment Counselor

## 2013-01-20 NOTE — ED Notes (Signed)
[employer] called. 

## 2013-01-20 NOTE — ED Provider Notes (Signed)
Patient care assumed from Dr. Elwin Mocha and Dr. Kevin Fenton at shift change with work up wending. Patient presenting for abdominal pain and chest pain. Found to have RML pneumonia. Clindamycin, Rocephin, and Azithromycin infusing for tx of pneumonia. Have spoken with Behavioral Health and patient accepted at treatment facility pending medical clearance. Plan to d/c to treatment facility upon completion of abx infusion.  Abx infusion completed. Patient stable for transfer to [facility]. EMTALA submitted and patient stable for transfer.   Antony Madura, PA-C 01/20/13 2300

## 2013-01-20 NOTE — BH Assessment (Addendum)
Tele Assessment Note   Mark [name] is an 47 y.o. male. Pt presents to [facility] with C/O medical clearance. Pt reports Chest Pain and Abdominal Pain. Pt is requesting inpatient detox treatment from [facility]. Pt reports that his age of first use for Alcohol was at age 89 or 60. Pt reports that he consumes 1/5 of Liquor daily on average for the past month. Pt reports on-going alcohol abuse since 2005. Patient reports that his longest period of sobriety is 5 months. Pt reports that he consumed his last drink of liquor on 01-20-13(pt reports that he drank a shot glass of liquor) early this morning). Pt reports " i dont like drinking", " I just drink alcohol to relieve my chronic pain". Pt reports a history chronic neck,leg, and lower back pain caused by a work related injury in 2000. Pt reports current withdraw symptoms to include hand shakes and tremors. Pt denies a hx of seizures or dt's. Pt denies SI,HI, and [name] AVH reported.   Informed by Antony Madura, PA at [facility] that patient is now medically cleared and is ready to be transferred to [facility]. Pt accepted to [facility] by Donell Sievert assigned to bed 307-1. Attending Physician at [facility] will be Dr. Geoffery Lyons.   Axis I: 303.90 Severe Alcohol Use Disorder Axis II: Deferred Axis III:  Past Medical History  Diagnosis Date  . Hypertension   . Hyperlipidemia   . 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 ETOH abuse   . GERD (gastroesophageal reflux disease)   . Mental disorder   . Irregular heart beat   . Anxiety   . Shortness of breath   . Headache(784.0)   . Hepatitis   . Withdrawal seizures   . Alcohol withdrawal    Axis IV: economic problems, other psychosocial or environmental problems and problems related to social environment Axis V: 31-40 impairment in reality testing  Past Medical History:  Past Medical History  Diagnosis Date  . Hypertension   . Hyperlipidemia   . Drug abuse     pt reports opioid dependence  due to previous back surgeries  . ETOH abuse   . GERD (gastroesophageal reflux disease)   . Mental disorder   . Irregular heart beat   . Anxiety   . Shortness of breath   . Headache(784.0)   . Hepatitis   . Withdrawal seizures   . Alcohol withdrawal     Past Surgical History  Procedure Laterality Date  . Back surgery    . Neck surgery    . Fundoplasty transthoracic  2003  . Nasal sinus surgery      Family History:  Family History  Problem Relation Age of Onset  . Breast cancer Paternal Grandmother   . Lung cancer      was a smoker  . Hypertension Mother     Social History:  reports that he has been smoking Cigarettes.  He has a 60 pack-year smoking history. He has never used smokeless tobacco. He reports that he drinks about 4.2 ounces of alcohol per week. He reports that he uses illicit drugs.  Additional Social History:     CIWA: CIWA-Ar BP: 140/79 mmHg Pulse Rate: 97 Nausea and Vomiting: mild nausea with [name] vomiting Tactile Disturbances: very mild itching, pins and needles, burning or numbness Tremor: moderate, with patient's arms extended Auditory Disturbances: not present Paroxysmal Sweats: three Visual Disturbances: not present Anxiety: two Headache, Fullness in Head: very  mild Agitation: normal activity Orientation and Clouding of Sensorium: oriented and can do serial additions CIWA-Ar Total: 12 COWS:    Allergies:  Allergies  Allergen Reactions  . Lisinopril Anaphylaxis and Swelling    Home Medications:  (Not in a hospital admission)  OB/GYN Status:  [name] LMP for male patient.  General Assessment Data Location of Assessment: [facility] Services Is this a Tele or Face-to-Face Assessment?: Tele Assessment Is this an Initial Assessment or a Re-assessment for this encounter?: Initial Assessment Living Arrangements: Parent Can pt return to current living arrangement?: Yes Admission Status: Voluntary Is patient capable of signing voluntary  admission?: Yes Transfer from: Acute Hospital Referral Source: MD     [facility] Crisis Care Plan Living Arrangements: Parent Name of Psychiatrist: No Current Provider Name of Therapist: No Current Provider     Risk to self Suicidal Ideation: [name] Suicidal Intent: [name] Is patient at risk for suicide?: [name] Suicidal Plan?: [name] Access to Means: [name] What has been your use of drugs/alcohol within the last 12 months?: [facility]-Liquor Previous Attempts/Gestures: [name] How many times?: 0 Other Self Harm Risks: na Triggers for Past Attempts: None known Intentional Self Injurious Behavior: None Family Suicide History: [name] (Patient reports that his oldest daughter is diagnosed [place]) Recent stressful life event(s): Financial Problems (unemployed,chronic pains issues,separation from spouse) Persecutory voices/beliefs?: [name] Depression: Yes Depression Symptoms: Fatigue Substance abuse history and/or treatment for substance abuse?: Yes Suicide prevention information given to non-admitted patients: Not applicable  Risk to Others Homicidal Ideation: [name] Thoughts of Harm to Others: [name] Current Homicidal Intent: [name] Current Homicidal Plan: [name] Access to Homicidal Means: [name] Identified Victim: na History of harm to others?: [name] Assessment of Violence: None Noted Violent Behavior Description: Cooperative during assesment Does patient have access to weapons?: [name] Criminal Charges Pending?: [name] Does patient have a court date: [name]  Psychosis Hallucinations: None noted Delusions: None noted  Mental Status Report Appear/Hygiene: Other (Comment) (Unremarkable) Eye Contact: Fair Motor Activity: Restlessness Speech: Logical/coherent Level of Consciousness: Alert Mood: Sullen Affect: Sullen Anxiety Level: Minimal Thought Processes: Coherent;Relevant Judgement: Unimpaired Orientation: Person;Place;Time;Situation Obsessive Compulsive Thoughts/Behaviors: None  Cognitive Functioning Concentration: Normal Memory:  Recent Intact;Remote Intact IQ: Average Insight: Fair Impulse Control: Fair Appetite: Fair Weight Loss: 0 Weight Gain: 0 Sleep: Decreased Total Hours of Sleep:  (4-6 hrs of sleep) Vegetative Symptoms: None  ADLScreening [facility] Assessment Services) Patient's cognitive ability adequate to safely complete daily activities?: Yes Patient able to express need for assistance with ADLs?: Yes Independently performs ADLs?: Yes (appropriate for developmental age)  Prior Inpatient Therapy Prior Inpatient Therapy: [name] Prior Therapy Dates: na Prior Therapy Facilty/Provider(s): na Reason for Treatment: na  Prior Outpatient Therapy Prior Outpatient Therapy: [name] Prior Therapy Dates: na Prior Therapy Facilty/Provider(s): na Reason for Treatment: na  ADL Screening (condition at time of admission) Patient's cognitive ability adequate to safely complete daily activities?: Yes Patient able to express need for assistance with ADLs?: Yes Independently performs ADLs?: Yes (appropriate for developmental age)         Values / Beliefs Spiritual Requests During Hospitalization: None        Additional Information 1:1 In Past 12 Months?: [name] CIRT Risk: [name] Elopement Risk: [name] Does patient have medical clearance?: [name] (pt is currently being treated for Pneumonia)     Disposition:  Disposition Initial Assessment Completed for this Encounter: Yes Disposition of Patient: Inpatient treatment program (Pt accepted to [facility] once he is medically cleared.) Type of inpatient treatment program: Adult  Bjorn Pippin .Renda Rolls  Zavien Clubb, MS, LCASA Assessment Counselor  01/20/2013 10:26 PM

## 2013-01-20 NOTE — BH Assessment (Signed)
Consulted with Dr. Gwendolyn Grant who is requesting a TTS consult for patient as patient is presenting with C/O medical clearance r/t abdominal pain and requesting inpatient alcohol detox.  Spoke with patient's nurse Dorathy Daft who reports that patient is currently getting a abdominal CT scan and is not medically cleared at this time.   Glorious Peach, MS, LCASA Assessment Counselor

## 2013-01-20 NOTE — BH Assessment (Signed)
Consulted with ED, PA Antony Madura as EDP Dr. Gwendolyn Grant has left for the evening. Informed Tresa Endo that patient can be accepted to [facility] for inpatient detox treatment pending a 300 hall bed once he is medically stable and has been treated for diagnosed pneumonia. Pt accepted to [facility] by Donell Sievert once he is medically cleared.  Antony Madura, PA  agreed to contact TTS once patient is medically cleared.   Glorious Peach, MS, LCASA Assessment Counselor

## 2013-01-20 NOTE — ED Notes (Signed)
Rt upper abd pain and cp x 1 week sob and hurts all over

## 2013-01-20 NOTE — ED Provider Notes (Signed)
CSN: [id number]     Arrival date & time 01/20/13  1443 History   First MD Initiated Contact with Patient 01/20/13 1743     Chief Complaint  Patient presents with  . Chest Pain  . Abdominal Pain   (Consider location/radiation/quality/duration/timing/severity/associated sxs/prior Treatment) Patient is a 47 y.o. male presenting with chest pain and abdominal pain.  Chest Pain Associated symptoms: abdominal pain, nausea and shortness of breath   Associated symptoms: no back pain, no diaphoresis, no fever, no headache and not vomiting   Abdominal Pain Associated symptoms: chest pain, chills, nausea and shortness of breath   Associated symptoms: no diarrhea, no dysuria, no fever, no sore throat and no vomiting     47 year old male with history of alcoholism here with shaking chills, generalized weakness and diffuse myalgias for one week. States that his symptoms came on gradually and he began to compensate by increasing his alcohol intake. At baseline he drinks approximately 1/2 of the fifth to a fifth of whiskey per day. He states he also has shortness of breath, chest pain, and severe abdominal pain that are all stable for the last year or more. He describes his chest pain is bilateral, sharp, radiating to his right neck. To increase with exertion and relieved with rest. His abdominal pain is described as diffuse severe pain worsened by movement or palpation. He's had a decreased appetite and states he has not eaten for 2 days. He also notes frequent episodes of small amounts of urine. He states that his urine smells like feces, and states that also brown in color. He states that while he is urinating he often has episodes  of stopping midstream he believes is air. He states he comes in today for the weakness, chills, and myalgias as well as desire for inpatient detox.  Past Medical History  Diagnosis Date  . Hypertension   . Hyperlipidemia   . Drug abuse     pt reports opioid dependence due to  previous back surgeries  . ETOH abuse   . GERD (gastroesophageal reflux disease)   . Mental disorder   . Irregular heart beat   . Anxiety   . Shortness of breath   . Headache(784.0)   . Hepatitis   . Withdrawal seizures   . Alcohol withdrawal    Past Surgical History  Procedure Laterality Date  . Back surgery    . Neck surgery    . Fundoplasty transthoracic  2003  . Nasal sinus surgery     Family History  Problem Relation Age of Onset  . Breast cancer Paternal Grandmother   . Lung cancer      was a smoker  . Hypertension Mother    History  Substance Use Topics  . Smoking status: Current Every Day Smoker -- 2.00 packs/day for 30 years    Types: Cigarettes  . Smokeless tobacco: Never Used  . Alcohol Use: 4.2 oz/week    7 Shots of liquor per week     Comment: drinks 1pint + each day    Review of Systems  Constitutional: Positive for chills and appetite change. Negative for fever and diaphoresis.  HENT: Negative for sore throat.   Eyes: Negative for visual disturbance.  Respiratory: Positive for shortness of breath.   Cardiovascular: Positive for chest pain.  Gastrointestinal: Positive for nausea and abdominal pain. Negative for vomiting, diarrhea and blood in stool.  Genitourinary: Negative for dysuria.       Frequent episodes of small amount s of urine.  Air in urine  Musculoskeletal: Negative for back pain.  Skin: Negative for rash.  Neurological: Negative for headaches.  Psychiatric/Behavioral: Positive for agitation.    Allergies  Lisinopril  Home Medications   Current Outpatient Rx  Name  Route  Sig  Dispense  Refill  . cloNIDine (CATAPRES) 0.1 MG tablet   Oral   Take 0.1 mg by mouth daily as needed (for high blood pressure).         . folic acid (FOLVITE) 1 MG tablet   Oral   Take 1 mg by mouth daily.         Marland Kitchen gabapentin (NEURONTIN) 100 MG capsule   Oral   Take 1 capsule (100 mg total) by mouth 3 (three) times daily.   90 capsule   2    . metoprolol (LOPRESSOR) 50 MG tablet   Oral   Take 1 tablet (50 mg total) by mouth 2 (two) times daily.   60 tablet   0   . metoprolol (LOPRESSOR) 50 MG tablet   Oral   Take 50 mg by mouth 2 (two) times daily.          BP 138/93  Pulse 104  Temp(Src) 98.1 F (36.7 C) (Oral)  Resp 18  Ht 6\' 4"  (1.93 m)  Wt 200 lb (90.719 kg)  BMI 24.35 kg/m2  SpO2 96% Physical Exam  Constitutional: He is oriented to person, place, and time. He appears well-developed and well-nourished. No distress.  HENT:  Head: Normocephalic and atraumatic.  Eyes: EOM are normal. Pupils are equal, round, and reactive to light.  Neck: Normal range of motion. Neck supple.  Cardiovascular: Regular rhythm and normal heart sounds.   tachy  Pulmonary/Chest: Effort normal. No respiratory distress. He has wheezes.  Abdominal: Soft. Bowel sounds are normal. He exhibits distension. There is generalized tenderness. There is guarding. There is no rigidity, no rebound and negative Murphy's sign.  Genitourinary: Rectal exam shows anal tone normal. Prostate is tender.  Musculoskeletal: He exhibits no edema.  Neurological: He is alert and oriented to person, place, and time. He displays tremor. He exhibits normal muscle tone. Coordination normal.  Skin: Skin is warm and dry. He is not diaphoretic.  Psychiatric: He has a normal mood and affect.    ED Course  Procedures (including critical care time) Labs Review Labs Reviewed  CBC - Abnormal; Notable for the following:    RBC 5.85 (*)    RDW 15.9 (*)    All other components within normal limits  COMPREHENSIVE METABOLIC PANEL - Abnormal; Notable for the following:    Chloride 95 (*)    Glucose, Bld 132 (*)    All other components within normal limits  URINALYSIS, ROUTINE W REFLEX MICROSCOPIC - Abnormal; Notable for the following:    Ketones, ur 15 (*)    All other components within normal limits  SALICYLATE LEVEL - Abnormal; Notable for the following:     Salicylate Lvl <2.0 (*)    All other components within normal limits  ETHANOL - Abnormal; Notable for the following:    Alcohol, Ethyl (B) 157 (*)    All other components within normal limits  URINE RAPID DRUG SCREEN (HOSP PERFORMED) - Abnormal; Notable for the following:    Opiates POSITIVE (*)    All other components within normal limits  LIPASE, BLOOD  ACETAMINOPHEN LEVEL  TROPONIN I  POCT I-STAT TROPONIN I  OCCULT BLOOD, POC DEVICE   Imaging Review Dg Chest 2 View  01/20/2013  CLINICAL DATA:  Chest pain, fever  EXAM: CHEST  2 VIEW  COMPARISON:  11/07/2012  FINDINGS: The heart and pulmonary vascularity are within normal limits. The lungs are well aerated. A mild amount of right middle lobe infiltrate is noted anteriorly in the costophrenic angle. No other focal infiltrate is seen.  IMPRESSION: Mild right middle lobe infiltrate.   Electronically Signed   By: Alcide Clever M.D.   On: 01/20/2013 15:59    EKG Interpretation   None       MDM   1. Aspiration pneumonia   2. Alcoholism   3. Abdominal pain     47 y/o male here with shaking chills, myalgias, and weakness for 7 days with infiltrate on CXR, severe abdominal pain, and prostate tenderness requesting alcohol detox. Considering his alcoholism we will give clindamycin as well as usual CAP treatment to cover aspiration. With possible pneumaturia and severe abdominal pain, although chronic per his report, we will proceed with a CT abd/pelvis with.   Initial work up: CBC, CMP, Lipase, Alcohol/salicylate/tylenol level, UDS, UA Stool guiac CT Abd/plevis with Consult TTS for inpt detox  Elevated alcohol, UDS positive for opiates  Medically clear for Alcohol Detox Will need 7 days of PO antibiotics for Aspiration pna: 450 mg PO clinda TID, X 7 days,  and 250 mg PO azithromycin X 5 days  Discussed patient with oncoming provider, will follow up on CT abd/pelvis  Murtis Sink, MD [facility] Resident,  PGY-2 01/20/2013, 8:26 PM         Elenora Gamma, MD 01/20/13 2026

## 2013-01-20 NOTE — ED Provider Notes (Signed)
Medical screening examination/treatment/procedure(s) were performed by non-physician practitioner and as supervising physician I was immediately available for consultation/collaboration.  EKG Interpretation   None         Joya Gaskins, MD 01/20/13 2358

## 2013-01-21 ENCOUNTER — Encounter (HOSPITAL_COMMUNITY): Payer: Self-pay | Admitting: *Deleted

## 2013-01-21 ENCOUNTER — Inpatient Hospital Stay (HOSPITAL_COMMUNITY)
Admission: EM | Admit: 2013-01-21 | Discharge: 2013-01-28 | DRG: 897 | Disposition: A | Discharge status: Home / Self Care | Payer: Federal, State, Local not specified - Other | Source: Intra-hospital | Attending: Psychiatry | Admitting: Psychiatry

## 2013-01-21 DIAGNOSIS — G894 Chronic pain syndrome: Secondary | ICD-10-CM

## 2013-01-21 DIAGNOSIS — I426 Alcoholic cardiomyopathy: Secondary | ICD-10-CM

## 2013-01-21 DIAGNOSIS — F321 Major depressive disorder, single episode, moderate: Secondary | ICD-10-CM

## 2013-01-21 DIAGNOSIS — F39 Unspecified mood [affective] disorder: Secondary | ICD-10-CM

## 2013-01-21 DIAGNOSIS — I420 Dilated cardiomyopathy: Secondary | ICD-10-CM

## 2013-01-21 DIAGNOSIS — R109 Unspecified abdominal pain: Secondary | ICD-10-CM

## 2013-01-21 DIAGNOSIS — K219 Gastro-esophageal reflux disease without esophagitis: Secondary | ICD-10-CM | POA: Diagnosis present

## 2013-01-21 DIAGNOSIS — F1010 Alcohol abuse, uncomplicated: Secondary | ICD-10-CM | POA: Diagnosis present

## 2013-01-21 DIAGNOSIS — R0602 Shortness of breath: Secondary | ICD-10-CM

## 2013-01-21 DIAGNOSIS — K7010 Alcoholic hepatitis without ascites: Secondary | ICD-10-CM

## 2013-01-21 DIAGNOSIS — F1994 Other psychoactive substance use, unspecified with psychoactive substance-induced mood disorder: Secondary | ICD-10-CM

## 2013-01-21 DIAGNOSIS — G8929 Other chronic pain: Secondary | ICD-10-CM

## 2013-01-21 DIAGNOSIS — F10939 Alcohol use, unspecified with withdrawal, unspecified: Principal | ICD-10-CM | POA: Diagnosis present

## 2013-01-21 DIAGNOSIS — Z720 Tobacco use: Secondary | ICD-10-CM

## 2013-01-21 DIAGNOSIS — F1020 Alcohol dependence, uncomplicated: Secondary | ICD-10-CM | POA: Diagnosis present

## 2013-01-21 DIAGNOSIS — I10 Essential (primary) hypertension: Secondary | ICD-10-CM | POA: Diagnosis present

## 2013-01-21 DIAGNOSIS — E785 Hyperlipidemia, unspecified: Secondary | ICD-10-CM | POA: Diagnosis present

## 2013-01-21 DIAGNOSIS — F10239 Alcohol dependence with withdrawal, unspecified: Principal | ICD-10-CM | POA: Diagnosis present

## 2013-01-21 DIAGNOSIS — Z79899 Other long term (current) drug therapy: Secondary | ICD-10-CM

## 2013-01-21 DIAGNOSIS — F411 Generalized anxiety disorder: Secondary | ICD-10-CM | POA: Diagnosis present

## 2013-01-21 DIAGNOSIS — I472 Ventricular tachycardia: Secondary | ICD-10-CM

## 2013-01-21 LAB — CBC
HCT: 44.2 % (ref 39.0–52.0)
MCH: 27.8 pg (ref 26.0–34.0)
MCHC: 34.4 g/dL (ref 30.0–36.0)
MCV: 81 fL (ref 78.0–100.0)
Platelets: 237 10*3/uL (ref 150–400)
RBC: 5.46 MIL/uL (ref 4.22–5.81)
RDW: 16 % — ABNORMAL HIGH (ref 11.5–15.5)

## 2013-01-21 MED ORDER — ADULT MULTIVITAMIN W/MINERALS CH
1.0000 | ORAL_TABLET | Freq: Every day | ORAL | Status: DC
  Administered 2013-01-21 – 2013-01-28 (×8): 1 via ORAL
  Filled 2013-01-21 (×11): qty 1

## 2013-01-21 MED ORDER — VITAMIN B-1 100 MG PO TABS
100.0000 mg | ORAL_TABLET | Freq: Every day | ORAL | Status: DC
  Administered 2013-01-22 – 2013-01-28 (×7): 100 mg via ORAL
  Filled 2013-01-21 (×10): qty 1

## 2013-01-21 MED ORDER — GABAPENTIN 100 MG PO CAPS
100.0000 mg | ORAL_CAPSULE | Freq: Three times a day (TID) | ORAL | Status: DC
  Administered 2013-01-21: 100 mg via ORAL
  Filled 2013-01-21 (×5): qty 1

## 2013-01-21 MED ORDER — AZITHROMYCIN 250 MG PO TABS
250.0000 mg | ORAL_TABLET | Freq: Every day | ORAL | Status: DC
  Administered 2013-01-21 – 2013-01-23 (×3): 250 mg via ORAL
  Filled 2013-01-21 (×5): qty 1

## 2013-01-21 MED ORDER — CHLORDIAZEPOXIDE HCL 25 MG PO CAPS
25.0000 mg | ORAL_CAPSULE | Freq: Four times a day (QID) | ORAL | Status: AC
  Administered 2013-01-21 (×4): 25 mg via ORAL
  Filled 2013-01-21 (×4): qty 1

## 2013-01-21 MED ORDER — THIAMINE HCL 100 MG/ML IJ SOLN
100.0000 mg | Freq: Once | INTRAMUSCULAR | Status: AC
  Administered 2013-01-21: 100 mg via INTRAMUSCULAR
  Filled 2013-01-21: qty 2

## 2013-01-21 MED ORDER — TRAZODONE HCL 50 MG PO TABS
50.0000 mg | ORAL_TABLET | Freq: Every evening | ORAL | Status: DC | PRN
  Administered 2013-01-21 (×2): 50 mg via ORAL
  Filled 2013-01-21 (×4): qty 1

## 2013-01-21 MED ORDER — ONDANSETRON 4 MG PO TBDP
4.0000 mg | ORAL_TABLET | Freq: Four times a day (QID) | ORAL | Status: AC | PRN
  Administered 2013-01-21: 4 mg via ORAL
  Filled 2013-01-21: qty 1

## 2013-01-21 MED ORDER — DM-GUAIFENESIN ER 30-600 MG PO TB12
1.0000 | ORAL_TABLET | Freq: Two times a day (BID) | ORAL | Status: DC
  Administered 2013-01-21 – 2013-01-28 (×15): 1 via ORAL
  Filled 2013-01-21 (×20): qty 1

## 2013-01-21 MED ORDER — LOPERAMIDE HCL 2 MG PO CAPS: 2.0000 mg | ORAL_CAPSULE | ORAL | Status: AC | PRN

## 2013-01-21 MED ORDER — ACETAMINOPHEN 325 MG PO TABS
650.0000 mg | ORAL_TABLET | Freq: Four times a day (QID) | ORAL | Status: DC | PRN
Start: 2013-01-21 — End: 2013-01-28

## 2013-01-21 MED ORDER — MAGNESIUM HYDROXIDE 400 MG/5ML PO SUSP: 30.0000 mL | Freq: Every day | ORAL | Status: DC | PRN

## 2013-01-21 MED ORDER — ALBUTEROL SULFATE HFA 108 (90 BASE) MCG/ACT IN AERS
1.0000 | INHALATION_SPRAY | Freq: Four times a day (QID) | RESPIRATORY_TRACT | Status: DC | PRN
  Administered 2013-01-24 – 2013-01-26 (×3): 2 via RESPIRATORY_TRACT
  Filled 2013-01-21: qty 6.7

## 2013-01-21 MED ORDER — CHLORDIAZEPOXIDE HCL 25 MG PO CAPS
25.0000 mg | ORAL_CAPSULE | Freq: Four times a day (QID) | ORAL | Status: AC | PRN
  Administered 2013-01-21 – 2013-01-23 (×8): 25 mg via ORAL
  Filled 2013-01-21 (×11): qty 1

## 2013-01-21 MED ORDER — HYDROXYZINE HCL 25 MG PO TABS
25.0000 mg | ORAL_TABLET | Freq: Four times a day (QID) | ORAL | Status: AC | PRN
  Administered 2013-01-21 – 2013-01-24 (×6): 25 mg via ORAL
  Filled 2013-01-21 (×6): qty 1

## 2013-01-21 MED ORDER — ALUM & MAG HYDROXIDE-SIMETH 200-200-20 MG/5ML PO SUSP
30.0000 mL | ORAL | Status: DC | PRN
  Administered 2013-01-24 – 2013-01-26 (×2): 30 mL via ORAL

## 2013-01-21 MED ORDER — CLINDAMYCIN HCL 300 MG PO CAPS
450.0000 mg | ORAL_CAPSULE | Freq: Three times a day (TID) | ORAL | Status: AC
  Administered 2013-01-21 – 2013-01-27 (×20): 450 mg via ORAL
  Filled 2013-01-21 (×21): qty 1

## 2013-01-21 MED ORDER — DULOXETINE HCL 30 MG PO CPEP
30.0000 mg | ORAL_CAPSULE | Freq: Every day | ORAL | Status: DC
  Administered 2013-01-21 – 2013-01-22 (×2): 30 mg via ORAL
  Filled 2013-01-21 (×4): qty 1

## 2013-01-21 MED ORDER — CHLORDIAZEPOXIDE HCL 25 MG PO CAPS
25.0000 mg | ORAL_CAPSULE | Freq: Every day | ORAL | Status: AC
  Administered 2013-01-23 – 2013-01-24 (×2): 25 mg via ORAL

## 2013-01-21 MED ORDER — METOPROLOL TARTRATE 50 MG PO TABS
50.0000 mg | ORAL_TABLET | Freq: Two times a day (BID) | ORAL | Status: DC
  Administered 2013-01-21 – 2013-01-28 (×15): 50 mg via ORAL
  Filled 2013-01-21: qty 1
  Filled 2013-01-21: qty 2
  Filled 2013-01-21 (×18): qty 1

## 2013-01-21 MED ORDER — IBUPROFEN 600 MG PO TABS
600.0000 mg | ORAL_TABLET | Freq: Four times a day (QID) | ORAL | Status: DC | PRN
  Administered 2013-01-21: 600 mg via ORAL
  Filled 2013-01-21: qty 1

## 2013-01-21 MED ORDER — GABAPENTIN 300 MG PO CAPS
300.0000 mg | ORAL_CAPSULE | Freq: Three times a day (TID) | ORAL | Status: DC
  Administered 2013-01-21 – 2013-01-22 (×3): 300 mg via ORAL
  Filled 2013-01-21 (×6): qty 1

## 2013-01-21 MED ORDER — CHLORDIAZEPOXIDE HCL 25 MG PO CAPS
25.0000 mg | ORAL_CAPSULE | ORAL | Status: AC
  Administered 2013-01-23: 25 mg via ORAL
  Filled 2013-01-21 (×3): qty 1

## 2013-01-21 MED ORDER — CHLORDIAZEPOXIDE HCL 25 MG PO CAPS
25.0000 mg | ORAL_CAPSULE | Freq: Three times a day (TID) | ORAL | Status: AC
  Administered 2013-01-22 (×3): 25 mg via ORAL
  Filled 2013-01-21 (×3): qty 1

## 2013-01-21 NOTE — Progress Notes (Signed)
Recreation Therapy Notes  Date: 11.13.2014 Time: 2:30pm Location: [address] Dayroom  Group Topic: Animal Assisted Activities (AAA)  Behavioral Response: Engaged, Attentive, Appropriate   Affect: Euthymic  Clinical Observations/Feedback: Dog Team: Slate & handler. Patient interacted appropriately with peer, dog team, and LRT.   [employer] L Hue Frick, LRT/CTRS  Hanan Mcwilliams L 01/21/2013 4:37 PM 

## 2013-01-21 NOTE — ED Notes (Signed)
Belongings given to [place] from [employer]

## 2013-01-21 NOTE — Progress Notes (Signed)
Attended group 

## 2013-01-21 NOTE — ED Notes (Signed)
[facility] informed about temp and approved for admission still. Pt. Left by [facility] transport with tech.

## 2013-01-21 NOTE — Progress Notes (Signed)
Pt up briefly for repeat trazadone. States the first one did allow him to get about 2 hours of sleep. Denying any pain or physical problems at this time other than "feeling a little shaky." Pt medicated per order. Support given. Pt currently resting in bed safely. No SI/HI/AVH expressed. Lawrence [place]

## 2013-01-21 NOTE — ED Provider Notes (Signed)
I saw and evaluated the patient, reviewed the resident's note and I agree with the findings and plan.  EKG Interpretation   None       Alcoholic, here with abdominal pain, chills, cough and wanting detox. Xrays show pneumonia. CAP and aspiration coverage provided. 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 Lipase normal here. Will CT. TTS consulted.  Dagmar Hait, MD 01/21/13 [phone]

## 2013-01-21 NOTE — Progress Notes (Signed)
D.  Pt. Denies SI/HI and denies A/V hallucinations.  Pt. Attended group.  Pt. Experiencing withdrawal symptoms. A.  Vistaril PRN given for anxiety. R.  Pt. Reports anxiety decreased.

## 2013-01-21 NOTE — [facility] Suicide Risk Assessment (Signed)
Suicide Risk Assessment  Admission Assessment     Nursing information obtained from:    Demographic factors:    Current Mental Status:    Loss Factors:    Historical Factors:    Risk Reduction Factors:     CLINICAL FACTORS:   Alcohol/Substance Abuse/Dependencies  COGNITIVE FEATURES THAT CONTRIBUTE TO RISK:  Closed-mindedness Polarized thinking Thought constriction (tunnel vision)    SUICIDE RISK:   Moderate:  Frequent suicidal ideation with limited intensity, and duration, some specificity in terms of plans, no associated intent, good self-control, limited dysphoria/symptomatology, some risk factors present, and identifiable protective factors, including available and accessible social support.  PLAN OF CARE: Supportive approach/coping skills/relapse prevention                               [facility] detox protocol                               Reassess and address the co morbidities  I certify that inpatient services furnished can reasonably be expected to improve the patient's condition.  Abryanna Musolino A 01/21/2013, 7:34 PM

## 2013-01-21 NOTE — Progress Notes (Signed)
Recreation Therapy Notes  Date: 11.13.2014 Time: 3:00pm Location: [address] Dayroom  Group Topic: Leisure Education, Team Work  Goal Area(s) Addresses:  Patient will identify positive benefits of leisure.  Behavioral Response: Did not attend   Jearl Klinefelter, LRT/CTRS  Jearl Klinefelter 01/21/2013 4:28 PM

## 2013-01-21 NOTE — [facility] Group Notes (Signed)
[facility] LCSW Group Therapy  01/21/2013  1:15 PM   Type of Therapy:  Group Therapy  Participation Level:  Active  Participation Quality:  Appropriate and Attentive  Affect:  Appropriate, Flat and Depressed  Cognitive:  Alert and Appropriate  Insight:  Developing/Improving and Engaged  Engagement in Therapy:  Developing/Improving and Engaged  Modes of Intervention:  Clarification, Confrontation, Discussion, Education, Exploration, Limit-setting, Orientation, Problem-solving, Rapport Building, Dance movement psychotherapist, Socialization and Support  Summary of Progress/Problems: The topic for group was balance in life.  Today's group focused on defining balance in one's own words, identifying things that can knock one off balance, and exploring healthy ways to maintain balance in life. Group members were asked to provide an example of a time when they felt off balance, describe how they handled that situation,and process healthier ways to regain balance in the future. Group members were asked to share the most important tool for maintaining balance that they learned while at [facility] and how they plan to apply this method after discharge.  Pt discussed needing to let go of his addiction to restore balance in his life.  Pt states that his life has been unbalanced due to losing everything, such as family.  Pt states that his goal is to focus on the positive and let go of the negative.  Pt actively participated and was engaged in group discussion.    Tirsa Gail Horton, LCSW 01/21/2013 2:00 pm

## 2013-01-21 NOTE — Progress Notes (Signed)
Pt did not attend AA speaker meeting.

## 2013-01-21 NOTE — [facility] Counselor (Signed)
Adult Comprehensive Assessment  Patient ID: LOURDES MANNING, male   DOB: 1965-11-03, 47 y.o.   MRN: [id number]  Information Source: Information source: Patient  Current Stressors:  Educational / Learning stressors: N/A Employment / Job issues: Unemployed, Press photographer for disability Family Relationships: N/A Surveyor, quantity / Lack of resources (include bankruptcy): No income Housing / Lack of housing: living with mother Physical health (include injuries & life threatening diseases): pain in feet, states that he drinks to cope with the pain Social relationships: N/A Substance abuse: Alcohol abuse Bereavement / Loss: N/A  Living/Environment/Situation:  Living Arrangements: Parent Living conditions (as described by patient or guardian): Pt states that he lives with his mother in [place].  Pt reports this is a good environment.  How long has patient lived in current situation?: 2 years What is atmosphere in current home: Supportive;Loving;Comfortable  Family History:  Marital status: Separated Separated, when?: 2011 What types of issues is patient dealing with in the relationship?: Pt states that his drinking seperated them.  Reports that wife is still supportive Additional relationship information: N/A Does patient have children?: Yes How many children?: 2 How is patient's relationship with their children?: Pt reports having 2 daughters - good relationship with them  Childhood History:  By whom was/is the patient raised?: Both parents Additional childhood history information: Pt reports having a good childhood.  Description of patient's relationship with caregiver when they were a child: Pt states that he got along well with his parents for the most part.  Patient's description of current relationship with people who raised him/her: Pt states that he is still close to his mother.  Father is deceased Does patient have siblings?: Yes Number of Siblings: 1 Description of patient's current  relationship with siblings: Pt states that he has a distant relationship with sister.   Did patient suffer any verbal/emotional/physical/sexual abuse as a child?: No Did patient suffer from severe childhood neglect?: No Has patient ever been sexually abused/assaulted/raped as an adolescent or adult?: No Was the patient ever a victim of a crime or a disaster?: No Witnessed domestic violence?: No Has patient been effected by domestic violence as an adult?: No  Education:  Highest grade of school patient has completed: some college Currently a Consulting civil engineer?: No Learning disability?: No  Employment/Work Situation:   Employment situation: Unemployed Patient's job has been impacted by current illness: No What is the longest time patient has a held a job?: 10 years 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 Has patient ever been in the [employer]?: No Has patient ever served in Buyer, retail?: No  Financial Resources:   Financial resources: No income;Support from parents / caregiver;Food stamps Does patient have a representative payee or guardian?: No  Alcohol/Substance Abuse:   What has been your use of drugs/alcohol within the last 12 months?: Alcohol - 1/5 of liquor daily for years If attempted suicide, did drugs/alcohol play a role in this?: No Alcohol/Substance Abuse Treatment Hx: Past detox If yes, describe treatment: Detoxed at [facility] before Has alcohol/substance abuse ever caused legal problems?: No  Social Support System:   Patient's Community Support System: Good Describe Community Support System: Pt states that his mom is supportive and his wife he can talk to when needed.   Type of faith/religion: [employer]ri[employer] Knuckles How does patient's faith help to cope with current illness?: prayer, occasional church attendance  Leisure/Recreation:   Leisure and Hobbies: hiking and fishing  Strengths/Needs:   What things does the patient do well?: pt states  that he is a good listener  and loves to cook In what areas does patient struggle / problems for patient: Alcohol abuse  Discharge Plan:   Does patient have access to transportation?: Yes Will patient be returning to same living situation after discharge?: Yes Currently receiving community mental health services: No If no, would patient like referral for services when discharged?: Yes (What county?) [facility]) Does patient have financial barriers related to discharge medications?: No  Summary/Recommendations:     Patient is a 47 year old Caucasian Male with a diagnosis of Alcohol Use Disorder - Severe.  Patient lives in [place] with his mother.  Pt reports a long history of alcohol use and wanting to detox and stop drinking.  Pt states that he drinks to cope with the pain.  Patient will benefit from crisis stabilization, medication evaluation, group therapy and psycho education in addition to case management for discharge planning.    Horton, Salome Arnt. 01/21/2013

## 2013-01-21 NOTE — H&P (Signed)
Psychiatric Admission Assessment Adult  Patient Identification:  Mark [name] Date of Evaluation:  01/21/2013 Chief Complaint:  Alcohol Dependence History of Present Illness:: 47 Y/O male who endorsed bad chronic pain, legs back and legs. States he dependes on alchol to treat it. Drinks a fith a day as long as he remember at least since 2006. He is currently not working, he is separated from his wife due to his alcohol use Elements:  Location:  in patient. Quality:  unable to function. Severity:  severe. Timing:  every day. Duration:  years. Context:  alcohol dependence underlying mood disorder. Associated Signs/Synptoms: Depression Symptoms:  Denies (Hypo) Manic Symptoms:  Irritable Mood, Labiality of Mood, Anxiety Symptoms:  Excessive Worry, Panic Symptoms, Psychotic Symptoms:  Denies PTSD Symptoms: Negative  Psychiatric Specialty Exam: Physical Exam  Review of Systems  Eyes: Positive for blurred vision.  Respiratory: Positive for cough and shortness of breath.   Cardiovascular: Positive for chest pain.       Half pack a day  Gastrointestinal: Positive for nausea and diarrhea.  Genitourinary: Positive for urgency.  Musculoskeletal: Positive for back pain, joint pain and neck pain.       Leg pain  Skin: Negative.   Neurological: Positive for dizziness, tremors, weakness and headaches.  Endo/Heme/Allergies: Negative.   Psychiatric/Behavioral: Positive for depression and substance abuse. The patient is nervous/anxious.     Blood pressure 154/103, pulse 84, temperature 97.8 F (36.6 C), temperature source Oral, resp. rate 20, height 6' 4.5" (1.943 m), weight 89.359 kg (197 lb).Body mass index is 23.67 kg/(m^2).  General Appearance: Disheveled  Eye Solicitor::  Fair  Speech:  Clear and Coherent and Slow  Volume:  Decreased  Mood:  Anxious and Depressed  Affect:  Restricted  Thought Process:  Coherent and Goal Directed  Orientation:  Full (Time, Place, and Person)   Thought Content:  symptoms, worries, concerns  Suicidal Thoughts:  No  Homicidal Thoughts:  No  Memory:  Immediate;   Fair Recent;   Fair Remote;   Fair  Judgement:  Fair  Insight:  Present  Psychomotor Activity:  Restlessness  Concentration:  Fair  Recall:  Fair  Akathisia:  No  Handed:    AIMS (if indicated):     Assets:  Desire for Improvement  Sleep:  Number of Hours: 2    Past Psychiatric History: Diagnosis: Alcohol dependence  Hospitalizations:Denies  Outpatient Care: Denies  Substance Abuse Care:Denies  Self-Mutilation:Denies  Suicidal Attempts:Denies  Violent Behaviors:Denies   Past Medical History:   Past Medical History  Diagnosis Date  . Hypertension   . Hyperlipidemia   . 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 ETOH abuse   . GERD (gastroesophageal reflux disease)   . Mental disorder   . Irregular heart beat   . Anxiety   . Shortness of breath   . Headache(784.0)   . Hepatitis   . Withdrawal seizures   . Alcohol withdrawal     Allergies:   Allergies  Allergen Reactions  . Lisinopril Anaphylaxis and Swelling   PTA Medications: Prescriptions prior to admission  Medication Sig Dispense Refill  . cloNIDine (CATAPRES) 0.1 MG tablet Take 0.1 mg by mouth daily as needed (for high blood pressure).      . folic acid (FOLVITE) 1 MG tablet Take 1 mg by mouth daily.      Marland Kitchen gabapentin (NEURONTIN) 100 MG capsule Take 1 capsule (100 mg total) by mouth 3 (  three) times daily.  90 capsule  2  . metoprolol (LOPRESSOR) 50 MG tablet Take 50 mg by mouth 2 (two) times daily.        Previous Psychotropic Medications:  Medication/Dose  None               Substance Abuse History in the last 12 months:  no  Consequences of Substance Abuse: Family Consequences:  separation Withdrawal Symptoms:   Diaphoresis Nausea Tremors  Social History:  reports that he has been smoking Cigarettes.  He has a 60 pack-year smoking  history. He has never used smokeless tobacco. He reports that he drinks about 4.2 ounces of alcohol per week. He reports that he uses illicit drugs. Additional Social History:                      Current Place of Residence:  Lives with mother Place of Birth:   Family Members: Marital Status:  Separated Children:  Sons:  Daughters: 17, 18 Relationships: Education:  HS Print production planner Problems/Performance: Religious Beliefs/Practices: History of Abuse (Emotional/Phsycial/Sexual) Occupational Experiences; Government social research officer History:  None. Legal History: Hobbies/Interests:  Family History:   Family History  Problem Relation Age of Onset  . Breast cancer Paternal Grandmother   . Lung cancer      was a smoker  . Hypertension Mother     Results for orders placed during the hospital encounter of 01/21/13 (from the past 72 hour(s))  CBC     Status: Abnormal   Collection Time    01/21/13  6:40 AM      Result Value Range   WBC 6.7  4.0 - 10.5 K/uL   RBC 5.46  4.22 - 5.81 MIL/uL   Hemoglobin 15.2  13.0 - 17.0 g/dL   HCT 16.1  09.6 - 04.5 %   MCV 81.0  78.0 - 100.0 fL   MCH 27.8  26.0 - 34.0 pg   MCHC 34.4  30.0 - 36.0 g/dL   RDW 40.9 (*) 81.1 - 91.4 %   Platelets 237  150 - 400 K/uL   Comment: Performed at [facility]   Psychological Evaluations:  Assessment:   DSM5:  Schizophrenia Disorders:  none Obsessive-Compulsive Disorders:  none Trauma-Stressor Disorders:  none Substance/Addictive Disorders:  Alcohol Related Disorder - Severe (303.90) Depressive Disorders:  Major Depressive Disorder - Moderate (296.22)  AXIS I:  Substance Induced Mood Disorder AXIS II:  Deferred AXIS III:   Past Medical History  Diagnosis Date  . Hypertension   . Hyperlipidemia   . 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 ETOH abuse   . GERD (gastroesophageal reflux disease)   . Mental disorder   . Irregular heart  beat   . Anxiety   . Shortness of breath   . Headache(784.0)   . Hepatitis   . Withdrawal seizures   . Alcohol withdrawal    AXIS IV:  other psychosocial or environmental problems AXIS V:  41-50 serious symptoms  Treatment Plan/Recommendations:  Supportive approach/coping skills/relapse prevention                                                                 Librium Detox protocol  Treatment  Plan Summary: Daily contact with patient to assess and evaluate symptoms and progress in treatment Medication management Current Medications:  Current Facility-Administered Medications  Medication Dose Route Frequency Provider Last Rate Last Dose  . acetaminophen (TYLENOL) tablet 650 mg  650 mg Oral Q6H PRN Kerry Hough, PA-C      . albuterol (PROVENTIL HFA;VENTOLIN HFA) 108 (90 BASE) MCG/ACT inhaler 1-2 puff  1-2 puff Inhalation Q6H PRN Kerry Hough, PA-C      . alum & mag hydroxide-simeth (MAALOX/MYLANTA) 200-200-20 MG/5ML suspension 30 mL  30 mL Oral Q4H PRN Kerry Hough, PA-C      . azithromycin (ZITHROMAX) tablet 250 mg  250 mg Oral Daily Kerry Hough, PA-C   250 mg at 01/21/13 0849  . chlordiazePOXIDE (LIBRIUM) capsule 25 mg  25 mg Oral Q6H PRN Kerry Hough, PA-C   25 mg at 01/21/13 0314  . chlordiazePOXIDE (LIBRIUM) capsule 25 mg  25 mg Oral QID Kerry Hough, PA-C   25 mg at 01/21/13 0849   Followed by  . [START ON 01/22/2013] chlordiazePOXIDE (LIBRIUM) capsule 25 mg  25 mg Oral TID Kerry Hough, PA-C       Followed by  . [START ON 01/23/2013] chlordiazePOXIDE (LIBRIUM) capsule 25 mg  25 mg Oral BH-qamhs Spencer E Simon, PA-C       Followed by  . [START ON 01/24/2013] chlordiazePOXIDE (LIBRIUM) capsule 25 mg  25 mg Oral Daily Kerry Hough, PA-C      . clindamycin (CLEOCIN) capsule 450 mg  450 mg Oral Q8H Spencer E Simon, PA-C      . dextromethorphan-guaiFENesin [facility] DM) 30-600 MG per 12 hr tablet 1 tablet  1 tablet Oral BID Kerry Hough, PA-C   1 tablet at  01/21/13 0856  . gabapentin (NEURONTIN) capsule 100 mg  100 mg Oral TID Kerry Hough, PA-C   100 mg at 01/21/13 0849  . hydrOXYzine (ATARAX/VISTARIL) tablet 25 mg  25 mg Oral Q6H PRN Kerry Hough, PA-C   25 mg at 01/21/13 0314  . loperamide (IMODIUM) capsule 2-4 mg  2-4 mg Oral PRN Kerry Hough, PA-C      . magnesium hydroxide (MILK OF MAGNESIA) suspension 30 mL  30 mL Oral Daily PRN Kerry Hough, PA-C      . metoprolol (LOPRESSOR) tablet 50 mg  50 mg Oral BID Kerry Hough, PA-C   50 mg at 01/21/13 0849  . multivitamin with minerals tablet 1 tablet  1 tablet Oral Daily Kerry Hough, PA-C   1 tablet at 01/21/13 0849  . ondansetron (ZOFRAN-ODT) disintegrating tablet 4 mg  4 mg Oral Q6H PRN Kerry Hough, PA-C      . [START ON 01/22/2013] thiamine (VITAMIN B-1) tablet 100 mg  100 mg Oral Daily Spencer E Simon, PA-C      . traZODone (DESYREL) tablet 50 mg  50 mg Oral QHS,MR X 1 Kerry Hough, PA-C        Observation Level/Precautions:  15 minute checks  Laboratory:  As per the ED  Psychotherapy:  Individual/group  Medications:  Librium detox  Consultations:    Discharge Concerns:    Estimated LOS: 3-5 days  Other:     I certify that inpatient services furnished can reasonably be expected to improve the patient's condition.   Maysel Mccolm A 11/13/20149:43 AM

## 2013-01-21 NOTE — [facility] Suicide Risk Assessment (Signed)
[facility] INPATIENT: Family/Significant Other Suicide Prevention Education  Suicide Prevention Education:  Education Completed; No one has been identified by the patient as the family member/significant other with whom the patient will be residing, and identified as the person(s) who will aid the patient in the event of a mental health crisis (suicidal ideations/suicide attempt). With written consent from the patient, the family member/significant other has been provided the following suicide prevention education, prior to the and/or following the discharge of the patient.  The suicide prevention education provided includes the following:  Suicide risk factors  Suicide prevention and interventions  National Suicide Hotline telephone number  [facility] assessment telephone number  [facility] Emergency Assistance 911  [facility] and/or Residential Mobile Crisis Unit telephone number Request made of family/significant other to:  Remove weapons (e.g., guns, rifles, knives), all items previously/currently identified as safety concern.  Remove drugs/medications (over-the-counter, prescriptions, illicit drugs), all items previously/currently identified as a safety concern. The family member/significant other verbalizes understanding of the suicide prevention education information provided. The family member/significant other agrees to remove the items of safety concern listed above. Pt did not c/o SI at admission, nor have they endorsed SI during their stay here. SPE not required.   Reyes Ivan, LCSW 01/21/2013  10:05 AM

## 2013-01-21 NOTE — Progress Notes (Signed)
Patient ID: Mark [name], male   DOB: 1965/11/01, 47 y.o.   MRN: [id number] Pt did not attend group. He was asleep in bed.

## 2013-01-21 NOTE — Progress Notes (Signed)
Adult Psychoeducational Group Note  Date:  01/21/2013 Time:  9:59 AM  Group Topic/Focus:  Goals Group:   The focus of this group is to help patients establish daily goals to achieve during treatment and discuss how the patient can incorporate goal setting into their daily lives to aide in recovery. Orientation:   The focus of this group is to educate the patient on the purpose and policies of crisis stabilization and provide a format to answer questions about their admission.  The group details unit policies and expectations of patients while admitted.  Participation Level:  Active  Participation Quality:  Appropriate  Affect:  Appropriate  Cognitive:  Appropriate  Insight: Appropriate  Engagement in Group:  Engaged  Modes of Intervention:  Orientation  Additional Comments:  Pt stated his goal for today is to feel better.  [name] 01/21/2013, 9:59 AM

## 2013-01-21 NOTE — Progress Notes (Signed)
Admission Note:  D: Pt appeared anxious and tired. Pt  denies SI, HI, AVH at this time. Pt is cooperative. Pt reports anxiety, mild agitation, and tremor related to detox. Patient states that he hates drinking and "only drinks for pain relief". Patient has neuropathy and c/o of back, feet, and leg throbbing, aching, tingling. Patient reports that his ex wife is his current medical POA.  A: Pt admitted to unit per protocol, skin assessment and search done.  Pt  educated on therapeutic milieu rules. Pt was introduced to milieu by nursing staff.  Patient received prn Librium and Vistaril. Resting quietly. R: Pt was receptive to education about the milieu .  15 min safety checks started.

## 2013-01-21 NOTE — Progress Notes (Signed)
Pt in bed much of the day.  He denies feeling depressed and rates his anxiety a 6 on his self-inventory.  His CIWA was a 6 at noon he has required several prn meds zofran last given at 1223 which helped but by 1502 had ibuprofen for his headache and librium for his tremors and chilling.

## 2013-01-22 DIAGNOSIS — F411 Generalized anxiety disorder: Secondary | ICD-10-CM

## 2013-01-22 MED ORDER — GABAPENTIN 400 MG PO CAPS
400.0000 mg | ORAL_CAPSULE | Freq: Three times a day (TID) | ORAL | Status: DC
  Administered 2013-01-22 – 2013-01-25 (×9): 400 mg via ORAL
  Filled 2013-01-22 (×13): qty 1

## 2013-01-22 MED ORDER — CYCLOBENZAPRINE HCL 10 MG PO TABS
10.0000 mg | ORAL_TABLET | Freq: Three times a day (TID) | ORAL | Status: DC
  Administered 2013-01-22 – 2013-01-28 (×18): 10 mg via ORAL
  Filled 2013-01-22 (×25): qty 1

## 2013-01-22 MED ORDER — TRAZODONE HCL 100 MG PO TABS
100.0000 mg | ORAL_TABLET | Freq: Every evening | ORAL | Status: DC | PRN
  Administered 2013-01-22 – 2013-01-26 (×8): 100 mg via ORAL
  Filled 2013-01-22 (×11): qty 1

## 2013-01-22 MED ORDER — LIDOCAINE 5 % EX PTCH
1.0000 | MEDICATED_PATCH | Freq: Every day | CUTANEOUS | Status: DC
  Administered 2013-01-23 – 2013-01-26 (×3): 1 via TRANSDERMAL
  Filled 2013-01-22 (×7): qty 1

## 2013-01-22 MED ORDER — CYCLOBENZAPRINE HCL 10 MG PO TABS
ORAL_TABLET | ORAL | Status: AC
  Filled 2013-01-22: qty 1

## 2013-01-22 MED ORDER — IBUPROFEN 800 MG PO TABS
800.0000 mg | ORAL_TABLET | Freq: Four times a day (QID) | ORAL | Status: DC | PRN
  Administered 2013-01-23 – 2013-01-25 (×2): 800 mg via ORAL
  Filled 2013-01-22 (×2): qty 1

## 2013-01-22 MED ORDER — GABAPENTIN 400 MG PO CAPS
400.0000 mg | ORAL_CAPSULE | Freq: Every day | ORAL | Status: DC
  Administered 2013-01-22 – 2013-01-24 (×3): 400 mg via ORAL
  Filled 2013-01-22 (×4): qty 1

## 2013-01-22 MED ORDER — DULOXETINE HCL 60 MG PO CPEP
60.0000 mg | ORAL_CAPSULE | Freq: Every day | ORAL | Status: DC
  Administered 2013-01-23 – 2013-01-25 (×3): 60 mg via ORAL
  Filled 2013-01-22 (×4): qty 1

## 2013-01-22 NOTE — [facility] Group Notes (Signed)
[facility] LCSW Aftercare Discharge Planning Group Note   01/22/2013 8:45 AM  Participation Quality:  Alert and Appropriate   Mood/Affect:  Appropriate  Depression Rating:  0  Anxiety Rating:  4  Thoughts of Suicide:  Pt denies SI/HI  Will you contract for safety?   Yes  Current AVH:  Pt denies  Plan for Discharge/Comments:  Pt attended discharge planning group and actively participated in group.  CSW provided pt with today's workbook.  Pt reports feeling tired today.  Pt states that he plans to return home and follow up outpatient, [facility] for medication management and therapy.  Pt states that he plans to attend AA meetings as well for added support.  No further needs voiced by pt at this time.    Transportation Means: Pt reports access to transportation  Supports: No supports mentioned at this time  Mark Ivan, LCSW 01/22/2013 9:30 AM

## 2013-01-22 NOTE — Progress Notes (Signed)
[facility] MD Progress Note  01/22/2013 2:33 PM HOSEA HANAWALT  MRN:  [id number] Subjective:  Daemien endorses anxiety, worry. Did not sleep too well last night. He is endorsing persistent pain in his legs (socks pattern) he is also endorsing pain in his neck. He continues to detox. He states that the pain has led him to drink. Claims he did the best on Vicofren and a combination of Vicodin and Lyrica. Eventually had to come off as he did not have insurance.  Diagnosis:   DSM5: Schizophrenia Disorders:  none Obsessive-Compulsive Disorders:  none Trauma-Stressor Disorders:  none Substance/Addictive Disorders:  Alcohol Related Disorder - Severe (303.90) Depressive Disorders:  Major Depressive Disorder - Moderate (296.22)  Axis I: Anxiety Disorder NOS  ADL's:  Intact  Sleep: Poor  Appetite:  Fair  Suicidal Ideation:  Plan:  denies Intent:  denies Means:  denies Homicidal Ideation:  Plan:  denies Intent:  denies Means:  denies AEB (as evidenced by):  Psychiatric Specialty Exam: Review of Systems  Constitutional: Negative.   Eyes: Negative.   Respiratory: Negative.   Cardiovascular: Negative.   Gastrointestinal: Negative.   Genitourinary: Negative.   Musculoskeletal: Positive for back pain and neck pain.  Skin: Negative.   Neurological: Negative.   Endo/Heme/Allergies: Negative.   Psychiatric/Behavioral: Positive for substance abuse. The patient is nervous/anxious.     Blood pressure 162/111, pulse 94, temperature 98.1 F (36.7 C), temperature source Oral, resp. rate 18, height 6' 4.5" (1.943 m), weight 89.359 kg (197 lb).Body mass index is 23.67 kg/(m^2).  General Appearance: Fairly Groomed  Patent attorney::  Fair  Speech:  Clear and Coherent  Volume:  fluctuates  Mood:  Anxious and worried, in pain  Affect:  anxious  Thought Process:  Coherent and Goal Directed  Orientation:  Full (Time, Place, and Person)  Thought Content:  somatically focused, worries, concerns, pain   Suicidal Thoughts:  No  Homicidal Thoughts:  No  Memory:  Immediate;   Fair Recent;   Fair Remote;   Fair  Judgement:  Fair  Insight:  Present  Psychomotor Activity:  Restlessness  Concentration:  Fair  Recall:  Fair  Akathisia:  No  Handed:    AIMS (if indicated):     Assets:  Desire for Improvement  Sleep:  Number of Hours: 4.25   Current Medications: Current Facility-Administered Medications  Medication Dose Route Frequency Provider Last Rate Last Dose  . acetaminophen (TYLENOL) tablet 650 mg  650 mg Oral Q6H PRN Kerry Hough, PA-C      . albuterol (PROVENTIL HFA;VENTOLIN HFA) 108 (90 BASE) MCG/ACT inhaler 1-2 puff  1-2 puff Inhalation Q6H PRN Kerry Hough, PA-C      . alum & mag hydroxide-simeth (MAALOX/MYLANTA) 200-200-20 MG/5ML suspension 30 mL  30 mL Oral Q4H PRN Kerry Hough, PA-C      . azithromycin [facility]) tablet 250 mg  250 mg Oral Daily Kerry Hough, PA-C   250 mg at 01/22/13 0804  . chlordiazePOXIDE (LIBRIUM) capsule 25 mg  25 mg Oral Q6H PRN Kerry Hough, PA-C   25 mg at 01/22/13 0200  . chlordiazePOXIDE (LIBRIUM) capsule 25 mg  25 mg Oral TID Kerry Hough, PA-C   25 mg at 01/22/13 1200   Followed by  . [START ON 01/23/2013] chlordiazePOXIDE (LIBRIUM) capsule 25 mg  25 mg Oral BH-qamhs Spencer E Simon, PA-C       Followed by  . [START ON 01/24/2013] chlordiazePOXIDE (LIBRIUM) capsule 25 mg  25 mg  Oral Daily Kerry Hough, PA-C      . clindamycin (CLEOCIN) capsule 450 mg  450 mg Oral Q8H Kerry Hough, PA-C   450 mg at 01/22/13 5284  . cyclobenzaprine (FLEXERIL) tablet 10 mg  10 mg Oral TID Rachael Fee, MD      . dextromethorphan-guaiFENesin [facility] DM) 30-600 MG per 12 hr tablet 1 tablet  1 tablet Oral BID Kerry Hough, PA-C   1 tablet at 01/22/13 0804  . [START ON 01/23/2013] DULoxetine (CYMBALTA) DR capsule 60 mg  60 mg Oral Daily Rachael Fee, MD      . gabapentin (NEURONTIN) capsule 400 mg  400 mg Oral TID Rachael Fee, MD   400  mg at 01/22/13 1316  . gabapentin (NEURONTIN) capsule 400 mg  400 mg Oral QHS Rachael Fee, MD      . hydrOXYzine (ATARAX/VISTARIL) tablet 25 mg  25 mg Oral Q6H PRN Kerry Hough, PA-C   25 mg at 01/22/13 1324  . ibuprofen (ADVIL,MOTRIN) tablet 800 mg  800 mg Oral Q6H PRN Rachael Fee, MD      . loperamide (IMODIUM) capsule 2-4 mg  2-4 mg Oral PRN Kerry Hough, PA-C      . magnesium hydroxide (MILK OF MAGNESIA) suspension 30 mL  30 mL Oral Daily PRN Kerry Hough, PA-C      . metoprolol (LOPRESSOR) tablet 50 mg  50 mg Oral BID Kerry Hough, PA-C   50 mg at 01/22/13 0804  . multivitamin with minerals tablet 1 tablet  1 tablet Oral Daily Kerry Hough, PA-C   1 tablet at 01/22/13 4010  . ondansetron (ZOFRAN-ODT) disintegrating tablet 4 mg  4 mg Oral Q6H PRN Kerry Hough, PA-C   4 mg at 01/21/13 1223  . thiamine (VITAMIN B-1) tablet 100 mg  100 mg Oral Daily Kerry Hough, PA-C   100 mg at 01/22/13 2725  . traZODone (DESYREL) tablet 100 mg  100 mg Oral QHS,MR X 1 Rachael Fee, MD        Lab Results:  Results for orders placed during the hospital encounter of 01/21/13 (from the past 48 hour(s))  CBC     Status: Abnormal   Collection Time    01/21/13  6:40 AM      Result Value Range   WBC 6.7  4.0 - 10.5 K/uL   RBC 5.46  4.22 - 5.81 MIL/uL   Hemoglobin 15.2  13.0 - 17.0 g/dL   HCT 36.6  44.0 - 34.7 %   MCV 81.0  78.0 - 100.0 fL   MCH 27.8  26.0 - 34.0 pg   MCHC 34.4  30.0 - 36.0 g/dL   RDW 42.5 (*) 95.6 - 38.7 %   Platelets 237  150 - 400 K/uL   Comment: Performed at [facility]    Physical Findings: AIMS: Facial and Oral Movements Muscles of Facial Expression: None, normal Lips and Perioral Area: None, normal Jaw: None, normal Tongue: None, normal,Extremity Movements Upper (arms, wrists, hands, fingers): None, normal Lower (legs, knees, ankles, toes): None, normal, Trunk Movements Neck, shoulders, hips: None, normal, Overall Severity Severity  of abnormal movements (highest score from questions above): None, normal Incapacitation due to abnormal movements: None, normal Patient's awareness of abnormal movements (rate only patient's report): No Awareness, Dental Status Current problems with teeth and/or dentures?: No Does patient usually wear dentures?: No  CIWA:  CIWA-Ar Total: 6 COWS:  Treatment Plan Summary: Daily contact with patient to assess and evaluate symptoms and progress in treatment Medication management  Plan: Supportive approach/coping skills/relapse prevention           Continue to detox            Reassess co morbidities            Pain Management: Motrin 800 mg Q 6 HRS, Neurontin 400 mg TID and HS, Cymbalta 60            mg, Flexeril 10 mg TID PRN muscle spasms, Lidoderm patch to neck  Medical Decision Making Problem Points:  Review of psycho-social stressors (1) Data Points:  Review of medication regiment & side effects (2) Review of new medications or change in dosage (2)  I certify that inpatient services furnished can reasonably be expected to improve the patient's condition.   Lorenza Winkleman A 01/22/2013, 2:33 PM

## 2013-01-22 NOTE — Progress Notes (Signed)
Adult Psychoeducational Group Note  Date:  01/22/2013 Time:  4:45 PM  Group Topic/Focus:  Recovery Goals:   The focus of this group is to identify appropriate goals for recovery and establish a plan to achieve them.  Participation Level:  Active  Participation Quality:  Attentive  Affect:  Appropriate and Flat  Cognitive:  Alert and Oriented  Insight: Improving  Engagement in Group:  Improving  Modes of Intervention:  Discussion and Education  Additional Comments:    Reynolds Bowl 01/22/2013, 4:45 PM

## 2013-01-22 NOTE — Progress Notes (Signed)
D) Pt has been attending the groups and interacting with select peers. States that he did not sleep last night. Staets "I would lie down and then get up. Didn't sleep at all".  Rates his depression at a 0 and his hopelessness at a 1. Denies SI and HI.  A) Given support and reassurance. Encouraged Pt to stay out of the bed this afternoon so that he could sleep tonight. Encouraged PT to go to the groups. R) Denies SI and HI. Affect is flat, mood depressed.

## 2013-01-22 NOTE — Tx Team (Signed)
Interdisciplinary Treatment Plan Update (Adult)  Date: 01/22/2013  Time Reviewed:  9:45 AM  Progress in Treatment: Attending groups: Yes Participating in groups:  Yes Taking medication as prescribed:  Yes Tolerating medication:  Yes Family/Significant othe contact made: No, n/a Patient understands diagnosis:  Yes Discussing patient identified problems/goals with staff:  Yes Medical problems stabilized or resolved:  Yes Denies suicidal/homicidal ideation: Yes Issues/concerns per patient self-inventory:  Yes Other:  New problem(s) identified: N/A  Discharge Plan or Barriers: CSW assessing for appropriate referrals.    Reason for Continuation of Hospitalization: Anxiety Depression Detox Medication Stabilization  Comments: N/A  Estimated length of stay: 3-5 days  For review of initial/current patient goals, please see plan of care.  Attendees: Patient:     Family:     Physician:  Dr. Dub Mikes 01/22/2013 10:12 AM   Nursing:   Renaldo Harrison., RN 01/22/2013 10:12 AM   Clinical Social Worker:  Reyes Ivan, LCSW 01/22/2013 10:12 AM   Other: Onnie Boer, RN case manager 01/22/2013 10:12 AM   Other:  Trula Slade, LCSWA 01/22/2013 10:12 AM   Other:  Genia Plants., RN 01/22/2013 10:12 AM   Other:     Other:    Other:    Other:    Other:    Other:    Other:     Scribe for Treatment Team:   Carmina Miller, 01/22/2013 , 10:12 AM

## 2013-01-22 NOTE — Progress Notes (Signed)
Adult Psychoeducational Group Note  Date:  01/22/2013 Time:  10:41 AM  Group Topic/Focus:  Therapeutic Activity  Participation Level:  Active  Participation Quality:  Appropriate  Affect:  Appropriate  Cognitive:  Appropriate  Insight: Appropriate  Engagement in Group:  Engaged  Modes of Intervention:  Activity  Additional Comments:  Pts played "Signs of Stress Bingo" and received peppermint candy for winning.   Tora Perches N 01/22/2013, 10:41 AM

## 2013-01-22 NOTE — [facility] Group Notes (Signed)
[facility] LCSW Group Therapy  01/22/2013 2:48 PM  Type of Therapy:  Group Therapy  Participation Level:  Did Not Attend   SmartLebron Quam 01/22/2013, 2:48 PM

## 2013-01-22 NOTE — Progress Notes (Signed)
Patient ID: Mark [name], male   DOB: 21-Mar-1965, 47 y.o.   MRN: [id number]  D: Pt denies SI/HI/AVH. Pt is pleasant and cooperative. Pt states he didn't sleep last night. Pt focused on general pain . Pt appears to want every medication possible when it's available.  A: Pt was offered support and encouragement. Pt was given scheduled medications. Pt was encourage to attend groups. Q 15 minute checks were done for safety.   R:Pt attends groups and interacts well with peers and staff. Pt is taking medication. Pt receptive to treatment and safety maintained on unit.

## 2013-01-23 MED ORDER — AZITHROMYCIN 500 MG PO TABS
500.0000 mg | ORAL_TABLET | Freq: Every day | ORAL | Status: AC
  Administered 2013-01-24: 500 mg via ORAL
  Filled 2013-01-23: qty 1

## 2013-01-23 NOTE — [facility] Group Notes (Signed)
[facility] Group Notes:  (Clinical Social Work)  01/23/2013     10-11AM  Summary of Progress/Problems:   The main focus of today's process group was for the patient to identify ways in which they have in the past sabotaged their own recovery. Motivational Interviewing was utilized to ask the group members what they get out of their substance use, and what reasons they may have for wanting to change.  The Stages of Change were explained using a handout, and patients identified where they currently are with regard to stages of change.  The patient expressed that he has been like an ostrich, only burying his head in liquor when life throws too much at him.  He uses the alcohol to make the anxiety and the overwhelming problems disappear.  He acknowledged that he has lost his marriage and his family through this, and stands to lose a lot more.  He wants to change for his health.  He left the room before sharing where he thinks he is in terms of stages of change, although he was present and fully engaged in the discussion about the model.  Type of Therapy:  Group Therapy - Process   Participation Level:  Active  Participation Quality:  Attentive, Sharing and Supportive  Affect:  Anxious and Blunted  Cognitive:  Appropriate  Insight:  Developing/Improving  Engagement in Therapy:  Engaged  Modes of Intervention:  Education, Support and Processing, Motivational Interviewing  Ambrose Mantle, LCSW 01/23/2013, 12:25 PM

## 2013-01-23 NOTE — Progress Notes (Signed)
[facility] MD Progress Note  01/23/2013 3:17 PM Mark [name]  MRN:  [id number] Subjective:  Arouses easily from nap. C/O "sweats" and difficult sleeping at night.  Denies SI, HI and hallucinations.  Reports good appetite.  Reports chronic back and joint pain and "hopes" to secure appointment with pain clinic.  Denies depression or anxiety symptoms Diagnosis:   DSM5:  Substance/Addictive Disorders:  Alcohol Related Disorder - Severe (303.90), Alcohol Intoxication with Use Disorder - Severe (F10.229) and Alcohol Withdrawal (291.81)   Axis I: Alcohol Abuse Axis II: Deferred Axis III:  Past Medical History  Diagnosis Date  . Hypertension   . Hyperlipidemia   . 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 ETOH abuse   . GERD (gastroesophageal reflux disease)   . Mental disorder   . Irregular heart beat   . Anxiety   . Shortness of breath   . Headache(784.0)   . Hepatitis   . Withdrawal seizures   . Alcohol withdrawal    Axis IV: other psychosocial or environmental problems, problems related to social environment and problems with primary support group Axis V: 41-50 serious symptoms  ADL's:  Intact  Sleep: Fair  Appetite:  Good  Suicidal Ideation:  Denies  Homicidal Ideation:  Denies  Psychiatric Specialty Exam: Review of Systems  Constitutional: Positive for diaphoresis.  HENT: Negative.   Eyes: Negative.   Respiratory: Negative.   Cardiovascular: Negative.   Gastrointestinal: Positive for abdominal pain.  Genitourinary: Negative.   Musculoskeletal: Positive for joint pain.  Skin: Negative.   Neurological: Negative.   Endo/Heme/Allergies: Negative.   Psychiatric/Behavioral: Positive for substance abuse.    Blood pressure 129/91, pulse 96, temperature 97.7 F (36.5 C), temperature source Oral, resp. rate 18, height 6' 4.5" (1.943 m), weight 89.359 kg (197 lb).Body mass index is 23.67 kg/(m^2).  General Appearance: Casual  Eye  Contact::  Good  Speech:  Clear and Coherent  Volume:  Normal  Mood:  Euthymic  Affect:  Congruent  Thought Process:  Coherent  Orientation:  Full (Time, Place, and Person)  Thought Content:  WDL  Suicidal Thoughts:  No  Homicidal Thoughts:  No  Memory:  Immediate;   Fair Recent;   Fair Remote;   Fair  Judgement:  Poor  Insight:  Lacking  Psychomotor Activity:  Decreased  Concentration:  Fair  Recall:  Fair  Akathisia:  No  Handed:  Right  AIMS (if indicated):     Assets:  Desire for Improvement Resilience  Sleep:  Number of Hours: 4   Current Medications: Current Facility-Administered Medications  Medication Dose Route Frequency Provider Last Rate Last Dose  . acetaminophen (TYLENOL) tablet 650 mg  650 mg Oral Q6H PRN Kerry Hough, PA-C      . albuterol (PROVENTIL HFA;VENTOLIN HFA) 108 (90 BASE) MCG/ACT inhaler 1-2 puff  1-2 puff Inhalation Q6H PRN Kerry Hough, PA-C      . alum & mag hydroxide-simeth (MAALOX/MYLANTA) 200-200-20 MG/5ML suspension 30 mL  30 mL Oral Q4H PRN Kerry Hough, PA-C      . azithromycin [facility]) tablet 250 mg  250 mg Oral Daily Kerry Hough, PA-C   250 mg at 01/23/13 4098  . chlordiazePOXIDE (LIBRIUM) capsule 25 mg  25 mg Oral Q6H PRN Kerry Hough, PA-C   25 mg at 01/23/13 1059  . chlordiazePOXIDE (LIBRIUM) capsule 25 mg  25 mg Oral BH-qamhs Kerry Hough, PA-C      .  clindamycin (CLEOCIN) capsule 450 mg  450 mg Oral Q8H Kerry Hough, PA-C   450 mg at 01/23/13 1203  . cyclobenzaprine (FLEXERIL) tablet 10 mg  10 mg Oral TID Rachael Fee, MD   10 mg at 01/23/13 1203  . dextromethorphan-guaiFENesin (MUCINEX DM) 30-600 MG per 12 hr tablet 1 tablet  1 tablet Oral BID Kerry Hough, PA-C   1 tablet at 01/23/13 5409  . DULoxetine (CYMBALTA) DR capsule 60 mg  60 mg Oral Daily Rachael Fee, MD   60 mg at 01/23/13 0806  . gabapentin (NEURONTIN) capsule 400 mg  400 mg Oral TID Rachael Fee, MD   400 mg at 01/23/13 1203  . gabapentin  (NEURONTIN) capsule 400 mg  400 mg Oral QHS Rachael Fee, MD   400 mg at 01/22/13 2202  . hydrOXYzine (ATARAX/VISTARIL) tablet 25 mg  25 mg Oral Q6H PRN Kerry Hough, PA-C   25 mg at 01/23/13 1205  . ibuprofen (ADVIL,MOTRIN) tablet 800 mg  800 mg Oral Q6H PRN Rachael Fee, MD   800 mg at 01/23/13 0532  . lidocaine (LIDODERM) 5 % 1 patch  1 patch Transdermal Daily Rachael Fee, MD   1 patch at 01/23/13 [phone]  . loperamide (IMODIUM) capsule 2-4 mg  2-4 mg Oral PRN Kerry Hough, PA-C      . magnesium hydroxide (MILK OF MAGNESIA) suspension 30 mL  30 mL Oral Daily PRN Kerry Hough, PA-C      . metoprolol (LOPRESSOR) tablet 50 mg  50 mg Oral BID Kerry Hough, PA-C   50 mg at 01/23/13 1478  . multivitamin with minerals tablet 1 tablet  1 tablet Oral Daily Kerry Hough, PA-C   1 tablet at 01/23/13 2956  . ondansetron (ZOFRAN-ODT) disintegrating tablet 4 mg  4 mg Oral Q6H PRN Kerry Hough, PA-C   4 mg at 01/21/13 1223  . thiamine (VITAMIN B-1) tablet 100 mg  100 mg Oral Daily Kerry Hough, PA-C   100 mg at 01/23/13 2130  . traZODone (DESYREL) tablet 100 mg  100 mg Oral QHS,MR X 1 Rachael Fee, MD   100 mg at 01/23/13 0115    Lab Results: No results found for this or any previous visit (from the past 48 hour(s)).  Physical Findings: AIMS: Facial and Oral Movements Muscles of Facial Expression: None, normal Lips and Perioral Area: None, normal Jaw: None, normal Tongue: None, normal,Extremity Movements Upper (arms, wrists, hands, fingers): None, normal Lower (legs, knees, ankles, toes): None, normal, Trunk Movements Neck, shoulders, hips: None, normal, Overall Severity Severity of abnormal movements (highest score from questions above): None, normal Incapacitation due to abnormal movements: None, normal Patient's awareness of abnormal movements (rate only patient's report): No Awareness, Dental Status Current problems with teeth and/or dentures?: No Does patient usually wear  dentures?: No  CIWA:  CIWA-Ar Total: 14 COWS:     Treatment Plan Summary: Daily contact with patient to assess and evaluate symptoms and progress in treatment Medication management  Plan: Review of chart, vital signs, medications, and notes 1. Individual and group therapy 2. Medication management for alcohol detox and withdrawal symptoms.  BP dose increased and antibiotic dose increased. 3. Coping skills for alcohol abuse 4.Continue crisis stabilization and management 5. Address health issues, vital signs---BP elevated adjusting medications, encouraged OP pain clinic for chronic pain. 6. Treatment plan in progress to prevent relapse of alcohol abuse  Medical Decision Making Problem Points:  Established problem, stable/improving (1) Data Points:  Review of new medications or change in dosage (2)  I certify that inpatient services furnished can reasonably be expected to improve the patient's condition.   [facility], MARY  PMH-NP 01/23/2013, 3:17 PM

## 2013-01-23 NOTE — Clinical Social Work Note (Signed)
Clinical Social Work Note  At patient's request, CSW provided clothing to patient, including shirt and pants, from the clothing closet.  Britanni Yarde Grossman-Orr, LCSW 01/23/2013, 5:20 PM 

## 2013-01-23 NOTE — Progress Notes (Signed)
Pt attended AA group 

## 2013-01-23 NOTE — Progress Notes (Addendum)
Pt is encouraged to hydrate. He stated he feels better and does not have any shortness of breath. Pt stated he is unsure if he had a fever last pm or the sweats were from withdrawals. Pt denies SI or HI and contracts for safety.Pt appears less anxious and he does not appear to have hand tremors this am. Pt has been walking in the halls and has been visiting with the other pts in the dayroom. Pt has been attending groups and has been drinking gatarade,. He does feel better but still has congestion in his chest.

## 2013-01-24 DIAGNOSIS — F39 Unspecified mood [affective] disorder: Secondary | ICD-10-CM

## 2013-01-24 MED ORDER — CHLORDIAZEPOXIDE HCL 25 MG PO CAPS
25.0000 mg | ORAL_CAPSULE | Freq: Four times a day (QID) | ORAL | Status: DC | PRN
  Administered 2013-01-24 – 2013-01-25 (×4): 25 mg via ORAL
  Filled 2013-01-24 (×3): qty 1

## 2013-01-24 NOTE — Progress Notes (Signed)
[facility] MD Progress Note  01/24/2013 12:21 PM Mark [name]  MRN:  [id number] Subjective:  Patient reports continued chronic pain in BLE, neck and lower back.  He reports a long history of medications including methadone prescribed by his PCP to treat his chronic pain.  He is requesting more aggressive pain management.  He reports sleeping better last night with the use of Trazodone.  He reports his depression today at a 6/10 and his anxiety 9/10. He denies SI, HI and hallucinations.      Diagnosis:   DSM5:  Substance/Addictive Disorders:  Alcohol Related Disorder - Severe (303.90), Alcohol Intoxication with Use Disorder - Severe (F10.229) and Alcohol Withdrawal (291.81) Depressive Disorders:  Major Depressive Disorder - Moderate (296.22)  Axis I: Alcohol Abuse, Mood Disorder NOS and Substance Abuse  ADL's:  Intact  Sleep: Fair  Appetite:  Good  Suicidal Ideation: denies  Homicidal Ideation: denies  AEB (as evidenced by):  Psychiatric Specialty Exam: Review of Systems  Constitutional: Negative.   Eyes: Negative.   Respiratory: Negative.   Cardiovascular: Negative.   Gastrointestinal: Negative.   Genitourinary: Negative.   Musculoskeletal: Positive for back pain, joint pain and neck pain.  Skin: Negative.   Neurological: Positive for tingling.  Endo/Heme/Allergies: Negative.   Psychiatric/Behavioral: Positive for depression and substance abuse. The patient is nervous/anxious and has insomnia.     Blood pressure 135/85, pulse 73, temperature 97.8 F (36.6 C), temperature source Oral, resp. rate 16, height 6' 4.5" (1.943 m), weight 89.359 kg (197 lb).Body mass index is 23.67 kg/(m^2).  General Appearance: Casual  Eye Contact::  Good  Speech:  Clear and Coherent  Volume:  Normal  Mood:  Depressed  Affect:  Congruent  Thought Process:  Goal Directed  Orientation:  Full (Time, Place, and Person)  Thought Content:  WDL  Suicidal Thoughts:  No  Homicidal Thoughts:  No   Memory:  Immediate;   Good  Judgement:  Fair  Insight:  Fair  Psychomotor Activity:  Decreased  Concentration:  Good  Recall:  Good  Akathisia:  No  Handed:  Right  AIMS (if indicated):     Assets:  Resilience  Sleep:  Number of Hours: 5   Current Medications: Current Facility-Administered Medications  Medication Dose Route Frequency Provider Last Rate Last Dose  . acetaminophen (TYLENOL) tablet 650 mg  650 mg Oral Q6H PRN Kerry Hough, PA-C      . albuterol (PROVENTIL HFA;VENTOLIN HFA) 108 (90 BASE) MCG/ACT inhaler 1-2 puff  1-2 puff Inhalation Q6H PRN Kerry Hough, PA-C      . alum & mag hydroxide-simeth (MAALOX/MYLANTA) 200-200-20 MG/5ML suspension 30 mL  30 mL Oral Q4H PRN Kerry Hough, PA-C      . chlordiazePOXIDE (LIBRIUM) capsule 25 mg  25 mg Oral Q6H PRN Kristeen Mans, NP   25 mg at 01/24/13 1202  . clindamycin (CLEOCIN) capsule 450 mg  450 mg Oral Q8H Spencer E Simon, PA-C   450 mg at 01/24/13 0500  . cyclobenzaprine (FLEXERIL) tablet 10 mg  10 mg Oral TID Rachael Fee, MD   10 mg at 01/24/13 1159  . dextromethorphan-guaiFENesin (MUCINEX DM) 30-600 MG per 12 hr tablet 1 tablet  1 tablet Oral BID Kerry Hough, PA-C   1 tablet at 01/24/13 1324  . DULoxetine (CYMBALTA) DR capsule 60 mg  60 mg Oral Daily Rachael Fee, MD   60 mg at 01/24/13 0820  . gabapentin (NEURONTIN) capsule 400 mg  400  mg Oral TID Rachael Fee, MD   400 mg at 01/24/13 1200  . gabapentin (NEURONTIN) capsule 400 mg  400 mg Oral QHS Rachael Fee, MD   400 mg at 01/23/13 2103  . ibuprofen (ADVIL,MOTRIN) tablet 800 mg  800 mg Oral Q6H PRN Rachael Fee, MD   800 mg at 01/23/13 0532  . lidocaine (LIDODERM) 5 % 1 patch  1 patch Transdermal Daily Rachael Fee, MD   1 patch at 01/23/13 [phone]  . magnesium hydroxide (MILK OF MAGNESIA) suspension 30 mL  30 mL Oral Daily PRN Kerry Hough, PA-C      . metoprolol (LOPRESSOR) tablet 50 mg  50 mg Oral BID Kerry Hough, PA-C   50 mg at 01/24/13 0820  .  multivitamin with minerals tablet 1 tablet  1 tablet Oral Daily Kerry Hough, PA-C   1 tablet at 01/24/13 0820  . thiamine (VITAMIN B-1) tablet 100 mg  100 mg Oral Daily Kerry Hough, PA-C   100 mg at 01/24/13 0820  . traZODone (DESYREL) tablet 100 mg  100 mg Oral QHS,MR X 1 Rachael Fee, MD   100 mg at 01/24/13 9147    Lab Results: No results found for this or any previous visit (from the past 48 hour(s)).  Physical Findings: AIMS: Facial and Oral Movements Muscles of Facial Expression: None, normal Lips and Perioral Area: None, normal Jaw: None, normal Tongue: None, normal,Extremity Movements Upper (arms, wrists, hands, fingers): None, normal Lower (legs, knees, ankles, toes): None, normal, Trunk Movements Neck, shoulders, hips: None, normal, Overall Severity Severity of abnormal movements (highest score from questions above): None, normal Incapacitation due to abnormal movements: None, normal Patient's awareness of abnormal movements (rate only patient's report): No Awareness, Dental Status Current problems with teeth and/or dentures?: No Does patient usually wear dentures?: No  CIWA:  CIWA-Ar Total: 0 COWS:     Treatment Plan Summary: Daily contact with patient to assess and evaluate symptoms and progress in treatment Medications management  Plan: 1. Continue crisis management and stabilization 2. Medication management to reduce current symptoms to base line and improve patient's overall level of functioning.  Discussed  in detail pain managemtn strategies, importance of follow up in OP setting, and concept of pain management clinics.  No changes today to increase or add medications for chronic pain.  Encouraged patient to investigate OP pain clinic options, and to discuss with his SW. 3. Treat health problems as indicated 4. Develop treatment plan  To decrease risk of relapse upon discharge and the need for readmission. 5. Psycho-social education regarding relapse  prevention and self care. 6. Disposition in process       Medical Decision Making Problem Points:  Established problem, stable/improving (1) Data Points:  Review of medication regiment & side effects (2)  I certify that inpatient services furnished can reasonably be expected to improve the patient's condition.   Lorinda Creed  PMHNP 01/24/2013, 12:21 PM

## 2013-01-24 NOTE — Progress Notes (Signed)
D: Pt denies SI/HI/AV. Pt is pleasant and cooperative. Pt rates depression at a 4 and Helplessness/hopelessness at a 5.  A: Pt was offered support and encouragement. Pt was given scheduled medications. Pt was encourage to attend groups. Q 15 minute checks were done for safety.  R:Pt attends groups and interacts well with peers and staff. Pt taking medication. Pt has no complaints.Pt receptive to treatment and safety maintained on unit. 

## 2013-01-24 NOTE — [facility] Group Notes (Signed)
[facility] Group Notes:  (Clinical Social Work)  01/24/2013  10:00-11:00AM  Summary of Progress/Problems:   The main focus of today's process group was to identify the patient's current support system and decide on other supports that can be put in place.  An emphasis was placed on AA/NA, support groups, therapy groups, and other professional supports in addition to natural supports.  Illustrations were used to demonstrate the need for additional supports.  A lengthy discussion ensued about unhealthy versus healthy supports.  The patient has current supports of his mother, stepfather, and 18/19yo children.  He is willing to seek others, understands the importance of building system in order to preempt future crisis.  He participated fully in the discussion and requested copies of available resources.  Type of Therapy:  Process Group   Participation Level:  Active  Participation Quality:  Attentive, Sharing and Supportive  Affect:  Blunted  Cognitive:  Appropriate and Oriented  Insight:  Engaged  Engagement in Therapy:  Engaged  Modes of Intervention:   Education, Support and [employer], LCSW 01/24/2013, 12:15pm

## 2013-01-24 NOTE — Progress Notes (Signed)
D: Patient resting in bed with eyes closed.  Respirations even and unlabored.  Patient appears to be in no apparent distress. A: Staff to monitor Q 15 mins for safety.   R:Patient remains safe on the unit.  

## 2013-01-25 MED ORDER — HYDROXYZINE HCL 50 MG PO TABS
50.0000 mg | ORAL_TABLET | Freq: Once | ORAL | Status: AC
  Administered 2013-01-25: 50 mg via ORAL
  Filled 2013-01-25 (×2): qty 1

## 2013-01-25 MED ORDER — GABAPENTIN 300 MG PO CAPS: 600.0000 mg | ORAL_CAPSULE | Freq: Every day | ORAL | Status: DC

## 2013-01-25 MED ORDER — HYDROXYZINE HCL 50 MG PO TABS
50.0000 mg | ORAL_TABLET | Freq: Four times a day (QID) | ORAL | Status: DC | PRN
  Administered 2013-01-25 – 2013-01-28 (×9): 50 mg via ORAL
  Filled 2013-01-25 (×9): qty 1
  Filled 2013-01-25: qty 28

## 2013-01-25 MED ORDER — GABAPENTIN 300 MG PO CAPS
600.0000 mg | ORAL_CAPSULE | Freq: Four times a day (QID) | ORAL | Status: DC
  Administered 2013-01-25 – 2013-01-27 (×8): 600 mg via ORAL
  Filled 2013-01-25 (×12): qty 2

## 2013-01-25 MED ORDER — PHENOL 1.4 % MT LIQD
1.0000 | OROMUCOSAL | Status: DC | PRN
  Administered 2013-01-25: 1 via OROMUCOSAL
  Filled 2013-01-25 (×2): qty 177

## 2013-01-25 NOTE — Progress Notes (Signed)
Pt attended AA group 

## 2013-01-25 NOTE — Progress Notes (Signed)
D:Pt has c/o anxiety and tremors this morning within three hours of taking librium. Pt reports that he has pain all over and rates his pain as an 8 on 1-10 scale with 10 being the most. Offered scheduled Lidocaine patch and prn medications for pain. Pt refused stating that none of those offered help his pain. Pt reports that he will alert MD that is coming on the unit and request different medications.  A:Supported pt to discuss feelings. Offered encouragement and 15 minute checks. Reported complaints to MD. New order received for anxiety medication. Gave as ordered. R:Pt denies si and hi. Safety maintained on the unit.

## 2013-01-25 NOTE — Progress Notes (Signed)
D.  Pt. Denies SI/HI and denies A/V hallucinations.  Pt. Reported mouth and throat irritation. A.  Spencer PA notified.  Order written for Phenol Chloraseptic  PRN . R.  Medication given and pt. Reports relief.

## 2013-01-25 NOTE — [facility] Group Notes (Signed)
[facility] LCSW Group Therapy  01/25/2013  1:15 PM   Type of Therapy:  Group Therapy  Participation Level:  Active  Participation Quality:  Appropriate and Attentive  Affect:  Appropriate and Anxious   Cognitive:  Alert and Appropriate  Insight:  Developing/Improving and Engaged  Engagement in Therapy:  Developing/Improving and Engaged  Modes of Intervention:  Clarification, Confrontation, Discussion, Education, Exploration, Limit-setting, Orientation, Problem-solving, Rapport Building, Dance movement psychotherapist, Socialization and Support  Summary of Progress/Problems: Pt identified obstacles faced currently and processed barriers involved in overcoming these obstacles. Pt identified steps necessary for overcoming these obstacles and explored motivation (internal and external) for facing these difficulties head on. Pt further identified one area of concern in their lives and chose a goal to focus on for today.  Pt states that he has too many obstacles to list but is able to identify finding a job as his main obstacle at this time.  Pt states that he has to work through transitioning his life from being a "drunk" to living a normal life.  Pt states that he plans to prevent relapse by going to meetings and utilizing positive supports.  Pt actively participated and was engaged in group discussion.    Reyes Ivan, LCSW 01/25/2013 2:38 PM

## 2013-01-25 NOTE — Progress Notes (Signed)
[facility] MD Progress Note  01/25/2013 5:17 PM Mark [name]  MRN:  [id number] Subjective:  Mark [name] continues to have a hard time. He complains of tremors, anxiety. states that the Librium is lasting only couple of hours. Wants something for the in between. He states he understands that the Librium is there only for detox, but still will want something to help the tremors. He is concerned bout the way he feels so near D/C Diagnosis:   DSM5: Schizophrenia Disorders:  none Obsessive-Compulsive Disorders:  none Trauma-Stressor Disorders:  none Substance/Addictive Disorders:  Alcohol Related Disorder - Severe (303.90) Depressive Disorders:  Major Depressive Disorder - Moderate (296.22)  Axis I: Anxiety Disorder NOS and Substance Induced Mood Disorder  ADL's:  Intact  Sleep: Fair  Appetite:  Fair  Suicidal Ideation:  Plan:  denies Intent:  denies Means:  denies Homicidal Ideation:  Plan:  denies Intent:  denies Means:  denies AEB (as evidenced by):  Psychiatric Specialty Exam: Review of Systems  Eyes: Negative.   Respiratory: Negative.   Cardiovascular: Negative.   Gastrointestinal: Negative.   Genitourinary: Negative.   Musculoskeletal: Positive for back pain and neck pain.  Skin: Negative.   Neurological: Positive for tremors and weakness.  Endo/Heme/Allergies: Negative.   Psychiatric/Behavioral: Positive for depression and substance abuse. The patient is nervous/anxious.     Blood pressure 127/79, pulse 77, temperature 97.6 F (36.4 C), temperature source Oral, resp. rate 16, height 6' 4.5" (1.943 m), weight 89.359 kg (197 lb).Body mass index is 23.67 kg/(m^2).  General Appearance: Fairly Groomed  Patent attorney::  Fair  Speech:  Clear and Coherent  Volume:  Normal  Mood:  Anxious and worried  Affect:  anxious, worried  Thought Process:  Coherent and Goal Directed  Orientation:  Full (Time, Place, and Person)  Thought Content:  worries, concerns, somatically focused   Suicidal Thoughts:  No  Homicidal Thoughts:  No  Memory:  Immediate;   Fair Recent;   Fair Remote;   Fair  Judgement:  Fair  Insight:  Present  Psychomotor Activity:  Restlessness  Concentration:  Fair  Recall:  Fair  Akathisia:  No  Handed:    AIMS (if indicated):     Assets:  Desire for Improvement  Sleep:  Number of Hours: 5.25   Current Medications: Current Facility-Administered Medications  Medication Dose Route Frequency Provider Last Rate Last Dose  . acetaminophen (TYLENOL) tablet 650 mg  650 mg Oral Q6H PRN Kerry Hough, PA-C      . albuterol (PROVENTIL HFA;VENTOLIN HFA) 108 (90 BASE) MCG/ACT inhaler 1-2 puff  1-2 puff Inhalation Q6H PRN Kerry Hough, PA-C   2 puff at 01/25/13 0554  . alum & mag hydroxide-simeth (MAALOX/MYLANTA) 200-200-20 MG/5ML suspension 30 mL  30 mL Oral Q4H PRN Kerry Hough, PA-C   30 mL at 01/24/13 1814  . clindamycin (CLEOCIN) capsule 450 mg  450 mg Oral Q8H Spencer E Simon, PA-C   450 mg at 01/25/13 1439  . cyclobenzaprine (FLEXERIL) tablet 10 mg  10 mg Oral TID Rachael Fee, MD   10 mg at 01/25/13 1712  . dextromethorphan-guaiFENesin (MUCINEX DM) 30-600 MG per 12 hr tablet 1 tablet  1 tablet Oral BID Kerry Hough, PA-C   1 tablet at 01/25/13 1713  . gabapentin (NEURONTIN) capsule 600 mg  600 mg Oral QID Rachael Fee, MD   600 mg at 01/25/13 1713  . hydrOXYzine (ATARAX/VISTARIL) tablet 50 mg  50 mg Oral Q6H PRN Reymundo Poll  Dub Mikes, MD   50 mg at 01/25/13 1008  . ibuprofen (ADVIL,MOTRIN) tablet 800 mg  800 mg Oral Q6H PRN Rachael Fee, MD   800 mg at 01/23/13 0532  . lidocaine (LIDODERM) 5 % 1 patch  1 patch Transdermal Daily Rachael Fee, MD   1 patch at 01/24/13 0800  . magnesium hydroxide (MILK OF MAGNESIA) suspension 30 mL  30 mL Oral Daily PRN Kerry Hough, PA-C      . metoprolol (LOPRESSOR) tablet 50 mg  50 mg Oral BID Kerry Hough, PA-C   50 mg at 01/25/13 1713  . multivitamin with minerals tablet 1 tablet  1 tablet Oral Daily  Kerry Hough, PA-C   1 tablet at 01/25/13 1610  . thiamine (VITAMIN B-1) tablet 100 mg  100 mg Oral Daily Kerry Hough, PA-C   100 mg at 01/25/13 9604  . traZODone (DESYREL) tablet 100 mg  100 mg Oral QHS,MR X 1 Rachael Fee, MD   100 mg at 01/24/13 2243    Lab Results: No results found for this or any previous visit (from the past 48 hour(s)).  Physical Findings: AIMS: Facial and Oral Movements Muscles of Facial Expression: None, normal Lips and Perioral Area: None, normal Jaw: None, normal Tongue: None, normal,Extremity Movements Upper (arms, wrists, hands, fingers): None, normal Lower (legs, knees, ankles, toes): None, normal, Trunk Movements Neck, shoulders, hips: None, normal, Overall Severity Severity of abnormal movements (highest score from questions above): None, normal Incapacitation due to abnormal movements: None, normal Patient's awareness of abnormal movements (rate only patient's report): No Awareness, Dental Status Current problems with teeth and/or dentures?: No Does patient usually wear dentures?: No  CIWA:  CIWA-Ar Total: 4 COWS:     Treatment Plan Summary: Daily contact with patient to assess and evaluate symptoms and progress in treatment Medication management  Plan: Supportive approach/coping skills/relapse prevention           CBT; mind fulness           He states that seems that the neurontin is helping with the neuropathic pain. Willing to increase the dose and see if it helps some more with the restlessness and the tremor. He tried Seroquel 25 that his daughter gave him and states it was "bad, really bad" he is already taking a Beta Blocker. Will increase the Vistaril to 50 mg Q Explained about protracted withdrawal and the fact the mild tremor might persist longer. His BP and his pulse are holding OK  Medical Decision Making Problem Points:  Review of psycho-social stressors (1) Data Points:  Review of medication regiment & side effects (2) Review  of new medications or change in dosage (2)  I certify that inpatient services furnished can reasonably be expected to improve the patient's condition.   Mark [name] A 01/25/2013, 5:17 PM

## 2013-01-25 NOTE — Tx Team (Signed)
Interdisciplinary Treatment Plan Update (Adult)  Date: 01/25/2013  Time Reviewed:  9:45 AM  Progress in Treatment: Attending groups: Yes Participating in groups:  Yes Taking medication as prescribed:  Yes Tolerating medication:  Yes Family/Significant othe contact made: No, N/A Patient understands diagnosis:  Yes Discussing patient identified problems/goals with staff:  Yes Medical problems stabilized or resolved:  Yes Denies suicidal/homicidal ideation: Yes Issues/concerns per patient self-inventory:  Yes Other:  New problem(s) identified: N/A  Discharge Plan or Barriers: Pt will follow up at [facility] for medication management and therapy.   Reason for Continuation of Hospitalization: Anxiety Depression Detox Withdrawal Symptoms Medication Stabilization  Comments: N/A  Estimated length of stay: 1 day, d/c tomorrow  For review of initial/current patient goals, please see plan of care.  Attendees: Patient:     Family:     Physician:  Dr. Dub Mikes 01/25/2013 9:46 AM   Nursing:   Omelia Blackwater, RN 01/25/2013 9:46 AM   Clinical Social Worker:  Reyes Ivan, LCSW 01/25/2013 9:46 AM   Other: Onnie Boer, RN case manager 01/25/2013 9:46 AM   Other:  Trula Slade, LCSWA 01/25/2013 9:46 AM   Other:  Waynetta Sandy, RN 01/25/2013 9:46 AM   Other:     Other:    Other:    Other:    Other:    Other:    Other:     Scribe for Treatment Team:   Carmina Miller, 01/25/2013 , 9:46 AM

## 2013-01-25 NOTE — BHH Group Notes (Signed)
[facility] LCSW Aftercare Discharge Planning Group Note   01/25/2013 8:45 AM  Participation Quality:  Alert and Appropriate   Mood/Affect:  Appropriate, Anxious  Depression Rating:  5  Anxiety Rating:  6  Thoughts of Suicide:  Pt denies SI/HI  Will you contract for safety?   Yes  Current AVH:  Pt denies  Plan for Discharge/Comments:  Pt attended discharge planning group and actively participated in group.  CSW provided pt with today's workbook.  Pt reports not feeling well today.  Pt reports increased anxiety and tremors today.  Pt states that he plans to return home and follow up outpatient, [facility] for medication management and therapy.  Pt states that he plans to attend AA meetings as well for added support.  No further needs voiced by pt at this time.    Transportation Means: Pt reports access to transportation  Supports: No supports mentioned at this time  Mark Ivan, LCSW 01/25/2013 9:36 AM

## 2013-01-26 MED ORDER — TRAZODONE HCL 100 MG PO TABS
200.0000 mg | ORAL_TABLET | Freq: Every evening | ORAL | Status: DC | PRN
  Administered 2013-01-26 – 2013-01-27 (×3): 200 mg via ORAL
  Filled 2013-01-26: qty 28
  Filled 2013-01-26 (×2): qty 2
  Filled 2013-01-26: qty 28
  Filled 2013-01-26 (×5): qty 2

## 2013-01-26 MED ORDER — ONDANSETRON 4 MG PO TBDP
4.0000 mg | ORAL_TABLET | Freq: Three times a day (TID) | ORAL | Status: DC | PRN
  Administered 2013-01-26: 4 mg via ORAL
  Filled 2013-01-26: qty 1

## 2013-01-26 MED ORDER — QUETIAPINE FUMARATE 25 MG PO TABS
25.0000 mg | ORAL_TABLET | Freq: Four times a day (QID) | ORAL | Status: DC | PRN
  Administered 2013-01-27 – 2013-01-28 (×3): 25 mg via ORAL
  Filled 2013-01-26 (×3): qty 1
  Filled 2013-01-26: qty 56
  Filled 2013-01-26: qty 1

## 2013-01-26 MED ORDER — QUETIAPINE FUMARATE 25 MG PO TABS
25.0000 mg | ORAL_TABLET | Freq: Once | ORAL | Status: AC
  Administered 2013-01-26: 25 mg via ORAL
  Filled 2013-01-26 (×2): qty 1

## 2013-01-26 MED ORDER — PANTOPRAZOLE SODIUM 40 MG PO TBEC
40.0000 mg | DELAYED_RELEASE_TABLET | Freq: Two times a day (BID) | ORAL | Status: DC
  Administered 2013-01-26 – 2013-01-28 (×4): 40 mg via ORAL
  Filled 2013-01-26 (×10): qty 1

## 2013-01-26 NOTE — Progress Notes (Signed)
Patient ID: Mark [name], male   DOB: Dec 20, 1965, 47 y.o.   MRN: [id number]  D: Patient with dull, flat affect and is focused on his medication times and doses. Pt rates his day a 4 and his hopelessness is a 3. Pt compliant with meds and is participating in group sessions. A: Q 15 minute safety checks per protocol, encourage staff/peer interaction and group participation. Administer medications as ordered by MD. R: Patient denies SI. 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 Pt pleasant and cooperative.

## 2013-01-26 NOTE — B[facility] Group Notes (Signed)
[facility] LCSW Group Therapy  01/26/2013 1:15 PM   Type of Therapy:  Group Therapy  Participation Level:  Did Not Attend  Reyes Ivan, LCSW 01/26/2013 2:17 PM

## 2013-01-26 NOTE — Progress Notes (Signed)
[name] MD Progress Note  01/26/2013 2:52 PM Mark [name]  MRN:  [id number] Subjective:  [name] continues to endorse anxiety, tremors ( VS WNL) Wanting some relief. Initially not wanting to try the Seroquel as stated he had a negative response to Seroquel 25 mg that her daughter had given him. He later agreed to take it. He states the has no resources, worried about how he is going to afford any medications we can give him. States that if he would have been gone couple of days earlier he would have relapsed. He states he is committed to make this work for him as does not want to go trough this again. Diagnosis:   DSM5: Schizophrenia Disorders:  none Obsessive-Compulsive Disorders:  none Trauma-Stressor Disorders:  none Substance/Addictive Disorders:  Alcohol Related Disorder - Severe (303.90) Depressive Disorders:  Major Depressive Disorder - Moderate (296.22)  Axis I: Anxiety Disorder NOS  ADL's:  Intact  Sleep: Poor  Appetite:  Fair  Suicidal Ideation:  Plan:  denies Intent:  denies Means:  denies Homicidal Ideation:  Plan:  denies Intent:  denies Means:  denies AEB (as evidenced by):  Psychiatric Specialty Exam: Review of Systems  Constitutional: Negative.   HENT: Negative.   Eyes: Negative.   Respiratory: Negative.   Cardiovascular: Negative.   Gastrointestinal: Negative.   Genitourinary: Negative.   Musculoskeletal: Positive for back pain.       Leg pain (neuropathy)  Skin: Negative.   Neurological: Negative.   Endo/Heme/Allergies: Negative.   Psychiatric/Behavioral: Positive for substance abuse. The patient is nervous/anxious.     Blood pressure 112/77, pulse 82, temperature 98.4 F (36.9 C), temperature source Oral, resp. rate 18, height 6' 4.5" (1.943 m), weight 89.359 kg (197 lb).Body mass index is 23.67 kg/(m^2).  General Appearance: Fairly Groomed  Patent attorney::  Fair  Speech:  Clear and Coherent  Volume:  Normal  Mood:  Anxious  Affect:  anxious   Thought Process:  Coherent and Goal Directed  Orientation:  Full (Time, Place, and Person)  Thought Content:  worries, concerns  Suicidal Thoughts:  No  Homicidal Thoughts:  No  Memory:  Immediate;   Fair Recent;   Fair Remote;   Fair  Judgement:  Fair  Insight:  Shallow  Psychomotor Activity:  Restlessness and Tremor  Concentration:  Fair  Recall:  Fair  Akathisia:  No  Handed:    AIMS (if indicated):     Assets:  Desire for Improvement  Sleep:  Number of Hours: 4.75   Current Medications: Current Facility-Administered Medications  Medication Dose Route Frequency Provider Last Rate Last Dose  . acetaminophen (TYLENOL) tablet 650 mg  650 mg Oral Q6H PRN Kerry Hough, PA-C      . albuterol (PROVENTIL HFA;VENTOLIN HFA) 108 (90 BASE) MCG/ACT inhaler 1-2 puff  1-2 puff Inhalation Q6H PRN Kerry Hough, PA-C   2 puff at 01/26/13 0102  . alum & mag hydroxide-simeth (MAALOX/MYLANTA) 200-200-20 MG/5ML suspension 30 mL  30 mL Oral Q4H PRN Kerry Hough, PA-C   30 mL at 01/24/13 1814  . clindamycin (CLEOCIN) capsule 450 mg  450 mg Oral Q8H Spencer E Simon, PA-C   450 mg at 01/26/13 1422  . cyclobenzaprine (FLEXERIL) tablet 10 mg  10 mg Oral TID Rachael Fee, MD   10 mg at 01/26/13 1148  . dextromethorphan-guaiFENesin (MUCINEX DM) 30-600 MG per 12 hr tablet 1 tablet  1 tablet Oral BID Kerry Hough, PA-C   1 tablet at 01/26/13  0800  . gabapentin (NEURONTIN) capsule 600 mg  600 mg Oral QID Rachael Fee, MD   600 mg at 01/26/13 1148  . hydrOXYzine (ATARAX/VISTARIL) tablet 50 mg  50 mg Oral Q6H PRN Rachael Fee, MD   50 mg at 01/26/13 0802  . ibuprofen (ADVIL,MOTRIN) tablet 800 mg  800 mg Oral Q6H PRN Rachael Fee, MD   800 mg at 01/25/13 1847  . lidocaine (LIDODERM) 5 % 1 patch  1 patch Transdermal Daily Rachael Fee, MD   1 patch at 01/26/13 [phone]  . magnesium hydroxide (MILK OF MAGNESIA) suspension 30 mL  30 mL Oral Daily PRN Kerry Hough, PA-C      . metoprolol (LOPRESSOR)  tablet 50 mg  50 mg Oral BID Kerry Hough, PA-C   50 mg at 01/26/13 0800  . multivitamin with minerals tablet 1 tablet  1 tablet Oral Daily Kerry Hough, PA-C   1 tablet at 01/26/13 0800  . phenol (CHLORASEPTIC) mouth spray 1 spray  1 spray Mouth/Throat PRN Kerry Hough, PA-C   1 spray at 01/25/13 2140  . thiamine (VITAMIN B-1) tablet 100 mg  100 mg Oral Daily Kerry Hough, PA-C   100 mg at 01/26/13 0800  . traZODone (DESYREL) tablet 200 mg  200 mg Oral QHS,MR X 1 Rachael Fee, MD        Lab Results: No results found for this or any previous visit (from the past 48 hour(s)).  Physical Findings: AIMS: Facial and Oral Movements Muscles of Facial Expression: None, normal Lips and Perioral Area: None, normal Jaw: None, normal Tongue: None, normal,Extremity Movements Upper (arms, wrists, hands, fingers): None, normal Lower (legs, knees, ankles, toes): None, normal, Trunk Movements Neck, shoulders, hips: None, normal, Overall Severity Severity of abnormal movements (highest score from questions above): None, normal Incapacitation due to abnormal movements: None, normal Patient's awareness of abnormal movements (rate only patient's report): No Awareness, Dental Status Current problems with teeth and/or dentures?: No Does patient usually wear dentures?: No  CIWA:  CIWA-Ar Total: 4 COWS:     Treatment Plan Summary: Daily contact with patient to assess and evaluate symptoms and progress in treatment Medication management  Plan: Supportive approach/coping skills/relapse prevention           CBT;mindfulness           Trial with Seroquel 25 mg   Medical Decision Making Problem Points:  Review of psycho-social stressors (1) Data Points:  Review of medication regiment & side effects (2) Review of new medications or change in dosage (2)  I certify that inpatient services furnished can reasonably be expected to improve the patient's condition.   Mignonne Afonso A 01/26/2013, 2:52  PM

## 2013-01-26 NOTE — Progress Notes (Signed)
Patient ID: Mark [name], male   DOB: 07-21-1965, 47 y.o.   MRN: [id number] Pt visible in the milieu.  Interacting appropriately with staff and peers.  Pt stated he is ready to go home but is depressed and anxious because he is not sure of what he is going to do when he gets there.  Support and encouragement given.  Fifteen minute checks in progress.  Pt safe on unit.

## 2013-01-26 NOTE — Progress Notes (Signed)
Attended AA 

## 2013-01-26 NOTE — Progress Notes (Signed)
Adult Psychoeducational Group Note  Date:  01/26/2013 Time:  11:02 AM  Group Topic/Focus:  Therapeutic activity sharing strengths with the group    Participation Level:  Active  Participation Quality:  Appropriate  Affect:  Appropriate  Cognitive:  Appropriate  Insight: Appropriate  Engagement in Group:  Engaged  Modes of Intervention:  Discussion and Education  Additional Comments:  Pt chose determined and kindness for words that describe his strengths.  Reynolds Bowl 01/26/2013, 11:02 AM

## 2013-01-26 NOTE — Progress Notes (Signed)
Recreation Therapy Notes   Date: 11.18.2014 Time: 3:00pm Location: [address] Dayroom   Group Topic: Communication, Team Building, Problem Solving  Goal Area(s) Addresses:  Patient will effectively work with peer towards shared goal.  Patient will identify skill used to make activity successful.  Patient will identify how skills used during activity can be used to reach post d/c goals.   Behavioral Response: Engaged, Attentive, Appropriate  Intervention: Problem Solving Activitiy  Activity: Life Boat. Patients were given a scenario about being on a sinking yacht. Patients were informed the yacht included 15 guest, 8 of which could be placed on the life boat, along with all group members. Individuals on guest list were of varying socioeconomic classes such as a Education officer, museum, Materials engineer, Midwife, Tree surgeon.   Education: Customer service manager, Discharge Planning   Education Outcome: Acknowledges understanding  Clinical Observations/Feedback: Patient actively engaged in group activity, voicing his opinion and debating with peers appropriately. Patient stepped out of group session as processing portion of group began, returning as group discussion was wrapping up. Patient attentively listened to portion of group discussion he was present for.   Marykay Lex Stephen Baruch, LRT/CTRS  Jearl Klinefelter 01/26/2013 4:41 PM

## 2013-01-26 NOTE — Progress Notes (Signed)
Adult Psychoeducational Group Note  Date:  01/26/2013 Time:  1:56 PM  Group Topic/Focus:  Recovery Goals:   The focus of this group is to identify appropriate goals for recovery and establish a plan to achieve them.  Participation Level:  Active  Participation Quality:  Appropriate and Attentive  Affect:  Appropriate  Cognitive:  Alert and Appropriate  Insight: Good  Engagement in Group:  Engaged  Modes of Intervention:  Activity, Discussion, Exploration, Socialization and Support  Additional Comments:  Pt came to group and shared that excuses and job performance are standing between him and recovery. Pt plans on owning up to his problems and fixing them and starting a new business.   [name] 01/26/2013, 1:56 PM

## 2013-01-26 NOTE — BHH Group Notes (Signed)
B[facility] Group Notes:  (Nursing/MHT/Case Management/Adjunct)  Date:  01/26/2013  Time:  9:27 AM  Type of Therapy:  [facility] Orientation  Participation Level:  Active  Participation Quality:  Appropriate and Attentive  Affect:  Appropriate and Flat  Cognitive:  Alert, Appropriate and Oriented  Insight:  Appropriate and Good  Engagement in Group:  Engaged  Modes of Intervention:  Education and Orientation  Summary of Progress/Problems: Pt states his goal is to stay positive and have a good attitude throughout the day. Group educated on the importance of exercise and it's benefits.  Renaee Munda 01/26/2013, 9:27 AM

## 2013-01-26 NOTE — Progress Notes (Signed)
Recreation Therapy Notes  Date: 11.18.2014 Time: 2:30pm Location: [address] Dayroom   Group Topic: Software engineer Activities (AAA)  Behavioral Response: Appropriate, Attentive.   Affect: Euthymic  Clinical Observations/Feedback: Dog Team: [employer]. Patient interacted appropriately with peer, dog team, and LRT.   Marykay Lex Camay Pedigo, LRT/CTRS  Jahquez Steffler L 01/26/2013 5:04 PM

## 2013-01-27 DIAGNOSIS — F410 Panic disorder [episodic paroxysmal anxiety] without agoraphobia: Secondary | ICD-10-CM

## 2013-01-27 MED ORDER — KETOROLAC TROMETHAMINE 30 MG/ML IJ SOLN
30.0000 mg | Freq: Once | INTRAMUSCULAR | Status: AC
  Administered 2013-01-27: 30 mg via INTRAMUSCULAR
  Filled 2013-01-27 (×2): qty 1

## 2013-01-27 MED ORDER — KETOROLAC TROMETHAMINE 10 MG PO TABS
10.0000 mg | ORAL_TABLET | Freq: Four times a day (QID) | ORAL | Status: DC | PRN
  Administered 2013-01-27: 10 mg via ORAL
  Filled 2013-01-27: qty 1

## 2013-01-27 MED ORDER — GABAPENTIN 400 MG PO CAPS
800.0000 mg | ORAL_CAPSULE | Freq: Four times a day (QID) | ORAL | Status: DC
  Administered 2013-01-27 – 2013-01-28 (×5): 800 mg via ORAL
  Filled 2013-01-27: qty 2
  Filled 2013-01-27: qty 112
  Filled 2013-01-27: qty 2
  Filled 2013-01-27: qty 112
  Filled 2013-01-27 (×3): qty 2
  Filled 2013-01-27: qty 112
  Filled 2013-01-27 (×3): qty 2
  Filled 2013-01-27: qty 112
  Filled 2013-01-27: qty 2

## 2013-01-27 NOTE — BHH Group Notes (Signed)
[facility] LCSW Group Therapy  01/27/2013  1:15 PM   Type of Therapy:  Group Therapy  Participation Level:  Active  Participation Quality:  Attentive and Supportive  Affect:  Appropriate  Cognitive:  Alert and Oriented  Insight:  Developing/Improving and Engaged  Engagement in Therapy:  Developing/Improving  Modes of Intervention:  Clarification, Confrontation, Discussion, Education, Exploration, Limit-setting, Orientation, Problem-solving, Rapport Building, Dance movement psychotherapist, Socialization and Support  Summary of Progress/Problems: The topic for group today was emotional regulation.  This group focused on both positive and negative emotion identification and allowed group members to process ways to identify feelings, regulate negative emotions, and find healthy ways to manage internal/external emotions. Group members were asked to reflect on a time when their reaction to an emotion led to a negative outcome and explored how alternative responses using emotion regulation would have benefited them. Group members were also asked to discuss a time when emotion regulation was utilized when a negative emotion was experienced.  Pt shared that he has two teenager daughters and has been angry and disappointed with their dating choices.  Pt states that because of this he turned to drinking to cope.  Pt states that he now has to accept his daughters choices, as they are adults and let the process happen, such as contacting authority if they touch his daughters, instead of handling it himself.  Pt actively participated and was engaged in group discussion.    Reyes Ivan, LCSW 01/27/2013 2:25 PM

## 2013-01-27 NOTE — Tx Team (Signed)
Interdisciplinary Treatment Plan Update (Adult)  Date: 01/27/2013  Time Reviewed:  9:45 AM  Progress in Treatment: Attending groups: Yes Participating in groups:  Yes Taking medication as prescribed:  Yes Tolerating medication:  Yes Family/Significant othe contact made: No, n/a Patient understands diagnosis:  Yes Discussing patient identified problems/goals with staff:  Yes Medical problems stabilized or resolved:  Yes Denies suicidal/homicidal ideation: Yes Issues/concerns per patient self-inventory:  Yes Other:  New problem(s) identified: N/A  Discharge Plan or Barriers: Pt will follow up at [facility] for medication management and therapy.    Reason for Continuation of Hospitalization: Medication Stabilization  Comments: N/A  Estimated length of stay: 1 day, d/c tomorrow  For review of initial/current patient goals, please see plan of care.  Attendees: Patient:     Family:     Physician:  Dr. Dub Mikes 01/27/2013 10:18 AM   Nursing:   Roswell Miners, RN 01/27/2013 10:18 AM   Clinical Social Worker:  Reyes Ivan, LCSW 01/27/2013 10:18 AM   Other: Onnie Boer, RN case manager 01/27/2013 10:18 AM   Other:  Trula Slade, LCSWA 01/27/2013 10:18 AM   Other:  Robbie Louis, RN 01/27/2013 10:18 AM   Other:  Dellia Cloud, RN 01/27/2013 10:19 AM   Other:    Other:    Other:    Other:    Other:    Other:     Scribe for Treatment Team:   Carmina Miller, 01/27/2013 , 10:18 AM

## 2013-01-27 NOTE — Progress Notes (Signed)
Pt reports he is doing about the same as yesterday.  He still frequently asks for prn medications.  He reports he has pain in his legs and asks for Toradol as he spoke with the MD today and requested something stronger than Tylenlol.  He received the IM dose around noon today.  Pt denies SI/HI/AV.  Pt denies any withdrawal symptoms.  Pt says he will discharge tomorrow home and follow up with outpatient.  Pt makes his needs known to staff.  Support and encouragement offered.

## 2013-01-27 NOTE — Progress Notes (Signed)
Pt has been up for most groups today.  He rated his depression a 4 and his hopelessness a 4 his anxiety 5 on his self-inventory. He denies any S/H ideation or A/V/H.  Has request may prn meds today.  He refused his lidocaine patch or ibuprofen this morning.  Dr. Dub Mikes started Toradol 10 mg po q 6 hours prn and one injection of the Toradol 30 mg IM at 1140 no other pain complaints. He plans to f/u at [facility] and return to AA.

## 2013-01-27 NOTE — BHH Group Notes (Signed)
[facility] LCSW Aftercare Discharge Planning Group Note   01/27/2013 8:45 AM  Participation Quality:  Alert and Appropriate   Mood/Affect:  Appropriate and Calm  Depression Rating:  4  Anxiety Rating:  4  Thoughts of Suicide:  Pt denies SI/HI  Will you contract for safety?   Yes  Current AVH:  Pt denies  Plan for Discharge/Comments:  Pt attended discharge planning group and actively participated in group.  CSW provided pt with today's workbook.  Pt reports feeling well today and stable to d/c.  Pt states that he plans to return home and follow up outpatient, [facility] for medication management and therapy.  Pt states that he plans to attend AA meetings as well for added support.  No further needs voiced by pt at this time.    Transportation Means: Pt reports access to transportation - mom will pick pt up  Supports: No supports mentioned at this time  Mark Ivan, LCSW 01/27/2013 9:32 AM

## 2013-01-27 NOTE — Progress Notes (Signed)
Adult Psychoeducational Group Note  Date:  01/27/2013 Time:  1:32 PM  Group Topic/Focus:  Personal Choices and Values:   The focus of this group is to help patients assess and explore the importance of values in their lives, how their values affect their decisions, how they express their values and what opposes their expression.  Participation Level:  Active  Participation Quality:  Appropriate and Attentive  Affect:  Appropriate  Cognitive:  Alert and Appropriate  Insight: Good  Engagement in Group:  Engaged  Modes of Intervention:  Activity, Discussion, Exploration, Socialization and Support  Additional Comments:  Pt came to group and shared that he would like to work on the personal value of being better organized and that his strongest personal values are being loyal and self-preservation. Pt was encouraged to think about how these personal values influence their life and how to keep evolving to become a person with many positive personal values.    [name] 01/27/2013, 1:32 PM

## 2013-01-27 NOTE — BHH Group Notes (Signed)
Adult Psychoeducational Group Note  Date:  01/27/2013 Time:  9:43 PM  Group Topic/Focus:  AA Meeting  Participation Level:  Active  Participation Quality:  Appropriate  Affect:  Appropriate  Cognitive:  Appropriate  Insight: Appropriate  Engagement in Group:  Engaged  Modes of Intervention:  Discussion and Education  Additional Comments:  Abhinav attended AA group.  Caroll Rancher A 01/27/2013, 9:43 PM

## 2013-01-27 NOTE — Progress Notes (Signed)
[facility] MD Progress Note  01/27/2013 2:00 PM Mark [name]  MRN:  [id number] Subjective:  Continues to be mostly symptom focused. Does say that the Seroquel seems to have helped with the anxiety, and agitation and that he would like to pursue it further. He also states that the Neurontin seems to have help with the neuropathic pain. Asked to adjust it further. He would still like to have something else for his "other pain"dd Diagnosis:   DSM5: Schizophrenia Disorders:  none Obsessive-Compulsive Disorders:  none Trauma-Stressor Disorders:  none Substance/Addictive Disorders:  Alcohol Related Disorder - Severe (303.90) Depressive Disorders:  Major Depressive Disorder - Moderate (296.22)  Axis I: Anxiety Disorder NOS and Panic Disorder  ADL's:  Intact  Sleep: Fair  Appetite:  Fair  Suicidal Ideation:  Plan:  denies Intent:  denies Means:  denies Homicidal Ideation:  Plan:  denies Intent:  denies Means:  denies AEB (as evidenced by):  Psychiatric Specialty Exam: Review of Systems  Constitutional: Negative.   HENT: Negative.   Eyes: Negative.   Respiratory: Negative.   Cardiovascular: Negative.   Gastrointestinal: Negative.   Genitourinary: Negative.   Musculoskeletal: Positive for back pain.       Leg pain  Skin: Negative.   Neurological: Positive for tremors.  Endo/Heme/Allergies: Negative.   Psychiatric/Behavioral: Positive for substance abuse. The patient is nervous/anxious.     Blood pressure 132/82, pulse 74, temperature 97.8 F (36.6 C), temperature source Oral, resp. rate 16, height 6' 4.5" (1.943 m), weight 89.359 kg (197 lb).Body mass index is 23.67 kg/(m^2).  General Appearance: Fairly Groomed  Patent attorney::  Fair  Speech:  Clear and Coherent  Volume:  Normal  Mood:  Anxious  Affect:  anxious, worried  Thought Process:  Coherent and Goal Directed  Orientation:  Full (Time, Place, and Person)  Thought Content:  Rumination and worries, concerns, symptoms   Suicidal Thoughts:  No  Homicidal Thoughts:  No  Memory:  Immediate;   Fair Recent;   Fair Remote;   Fair  Judgement:  Fair  Insight:  Present  Psychomotor Activity:  Restlessness  Concentration:  Fair  Recall:  Fair  Akathisia:  No  Handed:    AIMS (if indicated):     Assets:  Desire for Improvement  Sleep:  Number of Hours: 4.25   Current Medications: Current Facility-Administered Medications  Medication Dose Route Frequency Provider Last Rate Last Dose  . acetaminophen (TYLENOL) tablet 650 mg  650 mg Oral Q6H PRN Kerry Hough, PA-C      . albuterol (PROVENTIL HFA;VENTOLIN HFA) 108 (90 BASE) MCG/ACT inhaler 1-2 puff  1-2 puff Inhalation Q6H PRN Kerry Hough, PA-C   2 puff at 01/26/13 0102  . alum & mag hydroxide-simeth (MAALOX/MYLANTA) 200-200-20 MG/5ML suspension 30 mL  30 mL Oral Q4H PRN Kerry Hough, PA-C   30 mL at 01/26/13 2135  . clindamycin (CLEOCIN) capsule 450 mg  450 mg Oral Q8H Kerry Hough, PA-C   450 mg at 01/27/13 4098  . cyclobenzaprine (FLEXERIL) tablet 10 mg  10 mg Oral TID Rachael Fee, MD   10 mg at 01/27/13 1139  . dextromethorphan-guaiFENesin (MUCINEX DM) 30-600 MG per 12 hr tablet 1 tablet  1 tablet Oral BID Kerry Hough, PA-C   1 tablet at 01/27/13 0813  . gabapentin (NEURONTIN) capsule 800 mg  800 mg Oral QID Rachael Fee, MD   800 mg at 01/27/13 1139  . hydrOXYzine (ATARAX/VISTARIL) tablet 50 mg  50  mg Oral Q6H PRN Rachael Fee, MD   50 mg at 01/27/13 1610  . ibuprofen (ADVIL,MOTRIN) tablet 800 mg  800 mg Oral Q6H PRN Rachael Fee, MD   800 mg at 01/25/13 1847  . ketorolac (TORADOL) tablet 10 mg  10 mg Oral Q6H PRN Rachael Fee, MD      . lidocaine (LIDODERM) 5 % 1 patch  1 patch Transdermal Daily Rachael Fee, MD   1 patch at 01/26/13 [phone]  . magnesium hydroxide (MILK OF MAGNESIA) suspension 30 mL  30 mL Oral Daily PRN Kerry Hough, PA-C      . metoprolol (LOPRESSOR) tablet 50 mg  50 mg Oral BID Kerry Hough, PA-C   50 mg at  01/27/13 0813  . multivitamin with minerals tablet 1 tablet  1 tablet Oral Daily Kerry Hough, PA-C   1 tablet at 01/27/13 5409  . ondansetron (ZOFRAN-ODT) disintegrating tablet 4 mg  4 mg Oral Q8H PRN Kerry Hough, PA-C   4 mg at 01/26/13 2217  . pantoprazole (PROTONIX) EC tablet 40 mg  40 mg Oral BID Kerry Hough, PA-C   40 mg at 01/27/13 8119  . phenol (CHLORASEPTIC) mouth spray 1 spray  1 spray Mouth/Throat PRN Kerry Hough, PA-C   1 spray at 01/25/13 2140  . QUEtiapine (SEROQUEL) tablet 25 mg  25 mg Oral Q6H PRN Rachael Fee, MD   25 mg at 01/27/13 1259  . thiamine (VITAMIN B-1) tablet 100 mg  100 mg Oral Daily Kerry Hough, PA-C   100 mg at 01/27/13 1478  . traZODone (DESYREL) tablet 200 mg  200 mg Oral QHS,MR X 1 Rachael Fee, MD   200 mg at 01/27/13 0158    Lab Results: No results found for this or any previous visit (from the past 48 hour(s)).  Physical Findings: AIMS: Facial and Oral Movements Muscles of Facial Expression: None, normal Lips and Perioral Area: None, normal Jaw: None, normal Tongue: None, normal,Extremity Movements Upper (arms, wrists, hands, fingers): None, normal Lower (legs, knees, ankles, toes): None, normal, Trunk Movements Neck, shoulders, hips: None, normal, Overall Severity Severity of abnormal movements (highest score from questions above): None, normal Incapacitation due to abnormal movements: None, normal Patient's awareness of abnormal movements (rate only patient's report): No Awareness, Dental Status Current problems with teeth and/or dentures?: No Does patient usually wear dentures?: No  CIWA:  CIWA-Ar Total: 0 COWS:     Treatment Plan Summary: Daily contact with patient to assess and evaluate symptoms and progress in treatment Medication management  Plan: Supportive approach/coping skills/relapse prevention            CBT;mindfulness           Increase the Neurontin to 800 mg QID           Trial with Toradol IM/PO             D/C the lidoderm  Medical Decision Making Problem Points:  Review of psycho-social stressors (1) Data Points:  Review of medication regiment & side effects (2) Review of new medications or change in dosage (2)  I certify that inpatient services furnished can reasonably be expected to improve the patient's condition.   Teddy Rebstock A 01/27/2013, 2:00 PM

## 2013-01-27 NOTE — Progress Notes (Signed)
Pt resting in bed, eyes closed, breathing even and unlabored. Q15 min safety checks maintained. Pt remains safe on the unit.  

## 2013-01-28 ENCOUNTER — Encounter (HOSPITAL_COMMUNITY): Payer: Self-pay | Admitting: Registered Nurse

## 2013-01-28 DIAGNOSIS — F1010 Alcohol abuse, uncomplicated: Secondary | ICD-10-CM

## 2013-01-28 DIAGNOSIS — F10239 Alcohol dependence with withdrawal, unspecified: Principal | ICD-10-CM

## 2013-01-28 DIAGNOSIS — F1910 Other psychoactive substance abuse, uncomplicated: Secondary | ICD-10-CM

## 2013-01-28 MED ORDER — TRAZODONE HCL 100 MG PO TABS: 200.0000 mg | ORAL_TABLET | Freq: Every evening | ORAL | Status: DC | PRN

## 2013-01-28 MED ORDER — GABAPENTIN 400 MG PO CAPS: 800.0000 mg | ORAL_CAPSULE | Freq: Four times a day (QID) | ORAL | Status: DC

## 2013-01-28 MED ORDER — FOLIC ACID 1 MG PO TABS: 1.0000 mg | ORAL_TABLET | Freq: Every day | ORAL | Status: DC

## 2013-01-28 MED ORDER — METOPROLOL TARTRATE 50 MG PO TABS: 50.0000 mg | ORAL_TABLET | Freq: Two times a day (BID) | ORAL | Status: DC

## 2013-01-28 MED ORDER — HYDROXYZINE HCL 50 MG PO TABS: 50.0000 mg | ORAL_TABLET | Freq: Four times a day (QID) | ORAL | Status: DC | PRN

## 2013-01-28 MED ORDER — CYCLOBENZAPRINE HCL 10 MG PO TABS: 10.0000 mg | ORAL_TABLET | Freq: Three times a day (TID) | ORAL | Status: DC

## 2013-01-28 MED ORDER — QUETIAPINE FUMARATE 25 MG PO TABS: 25.0000 mg | ORAL_TABLET | Freq: Four times a day (QID) | ORAL | Status: DC | PRN

## 2013-01-28 MED ORDER — PANTOPRAZOLE SODIUM 40 MG PO TBEC: 40.0000 mg | DELAYED_RELEASE_TABLET | Freq: Two times a day (BID) | ORAL | Status: DC

## 2013-01-28 MED ORDER — ALBUTEROL SULFATE HFA 108 (90 BASE) MCG/ACT IN AERS: 1.0000 | INHALATION_SPRAY | Freq: Four times a day (QID) | RESPIRATORY_TRACT | Status: DC | PRN

## 2013-01-28 NOTE — BHH Suicide Risk Assessment (Signed)
Suicide Risk Assessment  Discharge Assessment     Demographic Factors:  Male and Caucasian  Mental Status Per Nursing Assessment::   On Admission:     Current Mental Status by Physician: In full contact with reality. There are no suicidal ideas, plans or intent. His mood is euthymic his affect is appropriate. No S/S of withdrawal. 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 He is committed to abstain and to follow up with [facility]   Loss Factors: Decline in physical health  Historical Factors: NA  Risk Reduction Factors:   Sense of responsibility to family, Living with another person, especially a relative and Positive social support  Continued Clinical Symptoms:  Alcohol/Substance Abuse/Dependencies  Cognitive Features That Contribute To Risk:  Closed-mindedness Polarized thinking Thought constriction (tunnel vision)    Suicide Risk:  Minimal: No identifiable suicidal ideation.  Patients presenting with no risk factors but with morbid ruminations; may be classified as minimal risk based on the severity of the depressive symptoms  Discharge Diagnoses:   AXIS I:  Alcohol Dependence, S/P withdrawal, Anxiety Disorder NOS AXIS II:  Deferred AXIS III:   Past Medical History  Diagnosis Date  . Hypertension   . Hyperlipidemia   . 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 ETOH abuse   . GERD (gastroesophageal reflux disease)   . Mental disorder   . Irregular heart beat   . Anxiety   . Shortness of breath   . Headache(784.0)   . Hepatitis   . Withdrawal seizures   . Alcohol withdrawal    AXIS IV:  other psychosocial or environmental problems AXIS V:  61-70 mild symptoms  Plan Of Care/Follow-up recommendations:  Activity:  as tolerated Diet:  regular Follow up outpatient basis/[facility]/AA/continue to work your relapse  prevention plan Is patient on multiple antipsychotic therapies at discharge:  No   Has Patient had three or more failed trials of antipsychotic monotherapy by history:  No  Recommended Plan for Multiple Antipsychotic Therapies: NA  Mark [name] A 01/28/2013, 12:14 PM

## 2013-01-28 NOTE — Progress Notes (Signed)
Pt was discharged home today.  He denied any S/I H/I or A/V hallucinations.    He was given f/u appointment, rx, sample medications, and hotline info booklet.  He voiced understanding to all instructions provided.  He declined the need for smoking cessation materials.  

## 2013-01-28 NOTE — Discharge Summary (Signed)
Physician Discharge Summary Note  Patient:  Mark [name] is an 47 y.o., male MRN:  [id number] DOB:  [birth year] Patient phone:  [phone] (home)  Patient address:   [address] 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   Eyes: Negative.   Respiratory: Negative.   Cardiovascular: Negative.   Gastrointestinal: Negative.   Genitourinary: Negative.   Musculoskeletal: Negative.   Skin: Negative.   Neurological: Negative.   Endo/Heme/Allergies: Negative.   Psychiatric/Behavioral: Positive for substance abuse. The patient is nervous/anxious.     DSM5: Substance/Addictive Disorders:  Alcohol Related Disorder - Severe (303.90), Alcohol Intoxication with Use Disorder - Severe (F10.229) and Alcohol Withdrawal (291.81)  Axis Diagnosis:   AXIS I:  Alcohol Abuse, Anxiety Disorder NOS, Substance Abuse and Substance Induced Mood Disorder AXIS II:  Deferred AXIS III:   Past Medical History  Diagnosis Date  . Hypertension   . Hyperlipidemia   . 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 ETOH abuse   . GERD (gastroesophageal reflux disease)   . Mental disorder   . Irregular heart beat   . Anxiety   . Shortness of breath   . Headache(784.0)   . Hepatitis   . Withdrawal seizures   . Alcohol withdrawal    AXIS IV:  other psychosocial or environmental problems, problems related to social environment and problems with primary support group AXIS V:  61-70 mild symptoms  Level of Care:  OP  Hospital Course:  On admission:  47 y.o. male. Pt presents to [facility] with C/O medical clearance. Pt reports Chest Pain and Abdominal Pain. Pt is  requesting inpatient detox treatment from [facility]. Pt reports that his age of first use for Alcohol was at age 18 or 12. Pt reports that he consumes 1/5 of Liquor daily on average for the past month. Pt reports on-going alcohol abuse since 2005. Patient reports that his longest period of sobriety is 5 months. Pt reports that he consumed his last drink of liquor on 01-20-13(pt reports that he drank a shot glass of liquor) early this morning). Pt reports " i dont like drinking", " I just drink alcohol to relieve my chronic pain". Pt reports a history chronic neck,leg, and lower back pain caused by a work related injury in 2000. Pt reports current withdrawal symptoms to include hand shakes and tremors. Pt denies a hx of seizures or dt's. Pt denies SI,HI, and no AVH reported.   During hospitalization:  [facility] protocol for alcohol detox implemented successfully.  His clonidine 0.1 mg was not continued from his home medication list after detox, no longer needed it.  Gabapentin increased from 100 mg TID to 800 mg QID for neuropathic pain, Lopressor 50 mg BID for HTN continued, folic acid 1 mg daily for vitamin deficiency continued.  Flexeril 10 mg TID for muscle spasms PRN, albuterol inhaler PRN wheezing and SOB, Clindamycin for respiratory infection--7 day course completed, Mucinex used for congestion PRN, Vistaril 50 mg every six hours PRN anxiety, Protonix 40 mg daily for GERD, Seroquel 25 mg every six hours PRN anxiety, and Trazodone 200 mg for sleep issues started.  Mark [name] attended and participated in therapy;  attended AA.  He denied suicidal/homicidal ideations and auditory/visual hallucinations, follow-up appointments encouraged to attend, outside support groups encouraged and information given, Rx and 14 day supply of medications given.  Mark [name] is mentally and physically stable for discharge.  Consults:  None  Significant Diagnostic Studies:  labs: completed, reviewed, stable  Discharge Vitals:   Blood pressure  120/68, pulse 83, temperature 97.4 F (36.3 C), temperature source Oral, resp. rate 16, height 6' 4.5" (1.943 m), weight 89.359 kg (197 lb). Body mass index is 23.67 kg/(m^2). Lab Results:   No results found for this or any previous visit (from the past 72 hour(s)).  Physical Findings: AIMS: Facial and Oral Movements Muscles of Facial Expression: None, normal Lips and Perioral Area: None, normal Jaw: None, normal Tongue: None, normal,Extremity Movements Upper (arms, wrists, hands, fingers): None, normal Lower (legs, knees, ankles, toes): None, normal, Trunk Movements Neck, shoulders, hips: None, normal, Overall Severity Severity of abnormal movements (highest score from questions above): None, normal Incapacitation due to abnormal movements: None, normal Patient's awareness of abnormal movements (rate only patient's report): No Awareness, Dental Status Current problems with teeth and/or dentures?: No Does patient usually wear dentures?: No  CIWA:  CIWA-Ar Total: 2 COWS:     Psychiatric Specialty Exam: See Psychiatric Specialty Exam and Suicide Risk Assessment completed by Attending Physician prior to discharge.  Discharge destination:  Home  Is patient on multiple antipsychotic therapies at discharge:  No   Has Patient had three or more failed trials of antipsychotic monotherapy by history:  No  Recommended Plan for Multiple Antipsychotic Therapies: NA  Discharge Orders   Future Orders Complete By Expires   Activity as tolerated - No restrictions  As directed    Diet - low sodium heart healthy  As directed        Medication List    STOP taking these medications       cloNIDine 0.1 MG tablet  Commonly known as:  CATAPRES      TAKE these medications     Indication   albuterol 108 (90 BASE) MCG/ACT inhaler  Commonly known as:  PROVENTIL HFA;VENTOLIN HFA  Inhale 1-2 puffs into the lungs every 6 (six) hours as needed for wheezing or shortness of breath.   Indication:   wheezing, SOB     cyclobenzaprine 10 MG tablet  Commonly known as:  FLEXERIL  Take 1 tablet (10 mg total) by mouth 3 (three) times daily.   Indication:  Muscle Spasm     folic acid 1 MG tablet  Commonly known as:  FOLVITE  Take 1 tablet (1 mg total) by mouth daily.   Indication:  Deficiency of Folic Acid in the Diet     gabapentin 400 MG capsule  Commonly known as:  NEURONTIN  Take 2 capsules (800 mg total) by mouth 4 (four) times daily.   Indication:  Neuropathic Pain     hydrOXYzine 50 MG tablet  Commonly known as:  ATARAX/VISTARIL  Take 1 tablet (50 mg total) by mouth every 6 (six) hours as needed for anxiety.   Indication:  Anxiety Neurosis     metoprolol 50 MG tablet  Commonly known as:  LOPRESSOR  Take 1 tablet (50 mg total) by mouth 2 (two) times daily.   Indication:  High Blood Pressure     pantoprazole 40 MG tablet  Commonly known as:  PROTONIX  Take 1 tablet (40 mg total) by mouth 2 (two) times daily.   Indication:  symptomatic GERD  QUEtiapine 25 MG tablet  Commonly known as:  SEROQUEL  Take 1 tablet (25 mg total) by mouth every 6 (six) hours as needed (anxiety).      traZODone 100 MG tablet  Commonly known as:  DESYREL  Take 2 tablets (200 mg total) by mouth at bedtime and may repeat dose one time if needed.   Indication:  Alcohol Withdrawal Syndrome, Trouble Sleeping           Follow-up Information   Follow up with [facility] On 01/29/2013. (Walk in on this date for hospital discharge appointment. Walk in clinic is Monday - Friday 8 am - 3 pm. They will then schedule you for medication management and therapy.)    Contact information:   [address] [place] [address] Phone: [phone] Fax: [phone]      Follow-up recommendations:  Activity:  as tolerated Diet:  low-sodium heart healthy diet Continue to work your relapse prevention plan Comments:  Patient will continue his care at [facility].  Total Discharge Time:  Greater than 30  minutes.  SignedNanine Means, PMH-NP 01/28/2013, 9:50 AM Agree with assessment and plan Reymundo Poll. Dub Mikes, M.D.

## 2013-01-28 NOTE — Progress Notes (Signed)
[facility] Adult Case Management Discharge Plan :  Will you be returning to the same living situation after discharge: Yes,  returning home At discharge, do you have transportation home?:Yes,  mom will pick pt up Do you have the ability to pay for your medications:Yes,  access to meds'  Release of information consent forms completed and in the chart;  Patient's signature needed at discharge.  Patient to Follow up at: Follow-up Information   Follow up with [facility] On 01/29/2013. (Walk in on this date for hospital discharge appointment. Walk in clinic is Monday - Friday 8 am - 3 pm. They will then schedule you for medication management and therapy.)    Contact information:   [address], [place] [address] Phone: [phone] Fax: [phone]      Patient denies SI/HI:   Yes,  denies SI/HI    Safety Planning and Suicide Prevention discussed:  Yes,  discussed with pt, n/a to contact family/friend due to no SI on admission.  See suicide prevention education note.   Carmina Miller 01/28/2013, 9:57 AM

## 2013-01-28 NOTE — Progress Notes (Signed)
Adult Psychoeducational Group Note  Date:  01/28/2013 Time:  12:00 PM  Group Topic/Focus:  Building Self Esteem:   The Focus of this group is helping patients become aware of the effects of self-esteem on their lives, the things they and others do that enhance or undermine their self-esteem, seeing the relationship between their level of self-esteem and the choices they make and learning ways to enhance self-esteem.  Participation Level:  Active  Participation Quality:  Appropriate and Attentive  Affect:  Appropriate  Cognitive:  Alert and Appropriate  Insight: Good  Engagement in Group:  Engaged  Modes of Intervention:  Activity, Discussion, Exploration, Socialization and Support  Additional Comments:  Pt came to group and shared that divorce and isolation are negatively effecting his self-esteem. Pt plans on changing this by having goals and reaching them and being sober.   [name] 01/28/2013, 12:00 PM

## 2013-01-28 NOTE — Progress Notes (Signed)
Pt attended AA group 

## 2013-02-01 NOTE — Progress Notes (Signed)
Patient Discharge Instructions:  After Visit Summary (AVS):   Faxed to:  02/01/13 Discharge Summary Note:   Faxed to:  02/01/13 Psychiatric Admission Assessment Note:   Faxed to:  02/01/13 Suicide Risk Assessment - Discharge Assessment:   Faxed to:  02/01/13 Faxed/Sent to the Next Level Care provider:  02/01/13 Faxed to [facility] @ [phone]  Jerelene Redden, 02/01/2013, 3:55 PM

## 2013-04-23 ENCOUNTER — Ambulatory Visit: Payer: No Typology Code available for payment source | Attending: Internal Medicine

## 2013-05-23 ENCOUNTER — Encounter (HOSPITAL_COMMUNITY): Payer: Self-pay | Admitting: Emergency Medicine

## 2013-05-23 ENCOUNTER — Emergency Department (INDEPENDENT_AMBULATORY_CARE_PROVIDER_SITE_OTHER)
Admission: EM | Admit: 2013-05-23 | Discharge: 2013-05-23 | Disposition: A | Discharge status: Home / Self Care | Payer: No Typology Code available for payment source | Source: Home / Self Care | Attending: Family Medicine | Admitting: Family Medicine

## 2013-05-23 DIAGNOSIS — G589 Mononeuropathy, unspecified: Secondary | ICD-10-CM

## 2013-05-23 DIAGNOSIS — G629 Polyneuropathy, unspecified: Secondary | ICD-10-CM

## 2013-05-23 MED ORDER — GABAPENTIN 300 MG PO CAPS: ORAL_CAPSULE | ORAL | Status: DC

## 2013-05-23 MED ORDER — TRAMADOL HCL 50 MG PO TABS: 50.0000 mg | ORAL_TABLET | Freq: Four times a day (QID) | ORAL | Status: DC | PRN

## 2013-05-23 MED ORDER — ACETAMINOPHEN-CODEINE #3 300-30 MG PO TABS: 1.0000 | ORAL_TABLET | ORAL | Status: DC | PRN

## 2013-05-23 NOTE — ED Provider Notes (Signed)
CSN: [id number]     Arrival date & time 05/23/13  1511 History   First MD Initiated Contact with Patient 05/23/13 1658     Chief Complaint  Patient presents with  . Leg Pain    Patient is a 48 y.o. male presenting with leg pain. The history is provided by the patient.  Leg Pain Location:  Leg and foot Time since incident:  12 months Injury: no   Pain details:    Quality:  Burning   Radiates to:  Does not radiate   Severity:  Severe   Onset quality:  Gradual   Timing:  Constant   Progression:  Partially resolved Chronicity:  Chronic Dislocation: no   Foreign body present:  No foreign bodies Prior injury to area:  No Relieved by:  Nothing Associated symptoms: tingling   Associated symptoms: no back pain, no fatigue, no fever, no muscle weakness, no numbness, no stiffness and no swelling   Pt reports approx 1 yr h/o bilateral LE neuropathy. Pt admits neuropathy brought on by chronic alcoholism. The pain associated with the neuropathy has worsened over the last several weeks. He was initially given a Rx for Lyrica but could not afford. He does take vitamin B6, B12 and Folate as instructed but it does not help the pain. Pt denies weakness to the BLE's but states the numbness, tingling and burning has now progressed to severe pain.   Past Medical History  Diagnosis Date  . Hypertension   . Hyperlipidemia   . 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 ETOH abuse   . GERD (gastroesophageal reflux disease)   . Mental disorder   . Irregular heart beat   . Anxiety   . Shortness of breath   . Headache(784.0)   . Hepatitis   . Withdrawal seizures   . Alcohol withdrawal    Past Surgical History  Procedure Laterality Date  . Back surgery    . Neck surgery    . Fundoplasty transthoracic  2003  . Nasal sinus surgery     Family History  Problem Relation Age of Onset  . Breast cancer Paternal Grandmother   . Lung cancer      was a smoker  .  Hypertension Mother    History  Substance Use Topics  . Smoking status: Current Every Day Smoker -- 2.00 packs/day for 30 years    Types: Cigarettes  . Smokeless tobacco: Never Used  . Alcohol Use: 4.2 oz/week    7 Shots of liquor per week     Comment: Pt reports drinking at least a 1/5 of liquor daily for the past month    Review of Systems  Constitutional: Negative for fever and fatigue.  Musculoskeletal: Negative for back pain and stiffness.  All other systems reviewed and are negative.    Allergies  Lisinopril  Home Medications   Current Outpatient Rx  Name  Route  Sig  Dispense  Refill  . acetaminophen-codeine (TYLENOL #3) 300-30 MG per tablet   Oral   Take 1 tablet by mouth every 4 (four) hours as needed for severe pain.   10 tablet   0   . albuterol (PROVENTIL HFA;VENTOLIN HFA) 108 (90 BASE) MCG/ACT inhaler   Inhalation   Inhale 1-2 puffs into the lungs every 6 (six) hours as needed for wheezing or shortness of breath.   1 Inhaler   0   . cyclobenzaprine (FLEXERIL) 10 MG tablet  Oral   Take 1 tablet (10 mg total) by mouth 3 (three) times daily.   60 tablet   0   . folic acid (FOLVITE) 1 MG tablet   Oral   Take 1 tablet (1 mg total) by mouth daily.   30 tablet   0   . gabapentin (NEURONTIN) 300 MG capsule      Start 300 mg PO qd x 1 day, then 300 mg PO BID x 1 day, then 300 mg TID: Not to exceed 3600 mg a day.   45 capsule   1   . gabapentin (NEURONTIN) 400 MG capsule   Oral   Take 2 capsules (800 mg total) by mouth 4 (four) times daily.   240 capsule   0   . hydrOXYzine (ATARAX/VISTARIL) 50 MG tablet   Oral   Take 1 tablet (50 mg total) by mouth every 6 (six) hours as needed for anxiety.   30 tablet   0   . metoprolol (LOPRESSOR) 50 MG tablet   Oral   Take 1 tablet (50 mg total) by mouth 2 (two) times daily.   60 tablet   0   . pantoprazole (PROTONIX) 40 MG tablet   Oral   Take 1 tablet (40 mg total) by mouth 2 (two) times daily.    60 tablet   0   . QUEtiapine (SEROQUEL) 25 MG tablet   Oral   Take 1 tablet (25 mg total) by mouth every 6 (six) hours as needed (anxiety).   60 tablet   0   . traMADol (ULTRAM) 50 MG tablet   Oral   Take 1 tablet (50 mg total) by mouth every 6 (six) hours as needed for moderate pain or severe pain.   15 tablet   0   . traZODone (DESYREL) 100 MG tablet   Oral   Take 2 tablets (200 mg total) by mouth at bedtime and may repeat dose one time if needed.   60 tablet   0    BP 148/98  Pulse 79  Temp(Src) 98.6 F (37 C) (Oral)  Resp 16  SpO2 98% Physical Exam  Constitutional: He is oriented to person, place, and time. He appears well-developed and well-nourished.  HENT:  Head: Normocephalic and atraumatic.  Eyes: Conjunctivae are normal.  Pulmonary/Chest: Effort normal.  Musculoskeletal:  BLE erythematous from lower calf down and somewhat mottled. Good DP pulses bil. Both feet TTP. No abnormalities noted to remaining BLE's. No open wounds.  Neurological: He is alert and oriented to person, place, and time.  Skin: Skin is warm and dry.  Psychiatric: He has a normal mood and affect.    ED Course  Procedures (including critical care time) Labs Review Labs Reviewed - No data to display Imaging Review No results found.   MDM   1. Neuropathy    Neuropathy associated w/ chronic alcoholism. Pt states he has been sober for approx 3+ months. Worsening pain associated w/ neuropathy to BLE's. Exam reveals erythematous and mottled BLE's from lower calf down to feet. Good palpable pulses bil. Pt requesting something to help pain to LE's. [place] controlled substance database reveals pt rec'd Rx for Tylenol #3 in 12/2012 and 01/2013 (30 each) as well as the Rx for Lyrica in 12/2012 which pt states he could not afford). Will start pt on Neurontin (standard taper up dosing),  Tramadol and a short course of Tylenol #3. Pt is scheduled to see PCP (Dr Mont Dutton) in a couple of  weeks.  Discussed w/ pt the importance of f/u and the possible need, at some point to discuss pain management clinic referral with PCP. Pt verbalizes understanding and is agreeable w/ plan.    Jeryl Columbia, NP 05/24/13 [phone]

## 2013-05-23 NOTE — ED Notes (Signed)
Reportedly has had pain in legs and feet as an ongoing issue due to orthopaedic and surgical issues. Cannot wait until appointment time w his regular provider  . Not taking his BP medicine, since it makes him so sleepy

## 2013-05-23 NOTE — Discharge Instructions (Signed)
Take medications as directed and keep your scheduled appointment with Dr Maceo Pro as scheduled.  Neuropathic Pain We often think that pain has a physical cause. If we get rid of the cause, the pain should go away. Nerves themselves can also cause pain. It is called neuropathic pain, which means nerve abnormality. It may be difficult for the patients who have it and for the treating caregivers. Pain is usually described as acute (short-lived) or chronic (long-lasting). Acute pain is related to the physical sensations caused by an injury. It can last from a few seconds to many weeks, but it usually goes away when normal healing occurs. Chronic pain lasts beyond the typical healing time. With neuropathic pain, the nerve fibers themselves may be damaged or injured. They then send incorrect signals to other pain centers. The pain you feel is real, but the cause is not easy to find.  CAUSES  Chronic pain can result from diseases, such as diabetes and shingles (an infection related to chickenpox), or from trauma, surgery, or amputation. It can also happen without any known injury or disease. The nerves are sending pain messages, even though there is no identifiable cause for such messages.   Other common causes of neuropathy include diabetes, phantom limb pain, or Regional Pain Syndrome (RPS).  As with all forms of chronic back pain, if neuropathy is not correctly treated, there can be a number of associated problems that lead to a downward cycle for the patient. These include depression, sleeplessness, feelings of fear and anxiety, limited social interaction and inability to do normal daily activities or work.  The most dramatic and mysterious example of neuropathic pain is called "phantom limb syndrome." This occurs when an arm or a leg has been removed because of illness or injury. The brain still gets pain messages from the nerves that originally carried impulses from the missing limb. These nerves now seem to  misfire and cause troubling pain.  Neuropathic pain often seems to have no cause. It responds poorly to standard pain treatment. Neuropathic pain can occur after:  Shingles (herpes zoster virus infection).  A lasting burning sensation of the skin, caused usually by injury to a peripheral nerve.  Peripheral neuropathy which is widespread nerve damage, often caused by diabetes or alcoholism.  Phantom limb pain following an amputation.  Facial nerve problems (trigeminal neuralgia).  Multiple sclerosis.  Reflex sympathetic dystrophy.  Pain which comes with cancer and cancer chemotherapy.  Entrapment neuropathy such as when pressure is put on a nerve such as in carpal tunnel syndrome.  Back, leg, and hip problems (sciatica).  Spine or back surgery.  HIV Infection or AIDS where nerves are infected by viruses. Your caregiver can explain items in the above list which may apply to you. SYMPTOMS  Characteristics of neuropathic pain are:  Severe, sharp, electric shock-like, shooting, lightening-like, knife-like.  Pins and needles sensation.  Deep burning, deep cold, or deep ache.  Persistent numbness, tingling, or weakness.  Pain resulting from light touch or other stimulus that would not usually cause pain.  Increased sensitivity to something that would normally cause pain, such as a pinprick. Pain may persist for months or years following the healing of damaged tissues. When this happens, pain signals no longer sound an alarm about current injuries or injuries about to happen. Instead, the alarm system itself is not working correctly.  Neuropathic pain may get worse instead of better over time. For some people, it can lead to serious disability. It is important to  be aware that severe injury in a limb can occur without a proper, protective pain response.Burns, cuts, and other injuries may go unnoticed. Without proper treatment, these injuries can become infected or lead to further  disability. Take any injury seriously, and consult your caregiver for treatment. DIAGNOSIS  When you have a pain with no known cause, your caregiver will probably ask some specific questions:   Do you have any other conditions, such as diabetes, shingles, multiple sclerosis, or HIV infection?  How would you describe your pain? (Neuropathic pain is often described as shooting, stabbing, burning, or searing.)  Is your pain worse at any time of the day? (Neuropathic pain is usually worse at night.)  Does the pain seem to follow a certain physical pathway?  Does the pain come from an area that has missing or injured nerves? (An example would be phantom limb pain.)  Is the pain triggered by minor things such as rubbing against the sheets at night? These questions often help define the type of pain involved. Once your caregiver knows what is happening, treatment can begin. Anticonvulsant, antidepressant drugs, and various pain relievers seem to work in some cases. If another condition, such as diabetes is involved, better management of that disorder may relieve the neuropathic pain.  TREATMENT  Neuropathic pain is frequently long-lasting and tends not to respond to treatment with narcotic type pain medication. It may respond well to other drugs such as antiseizure and antidepressant medications. Usually, neuropathic problems do not completely go away, but partial improvement is often possible with proper treatment. Your caregivers have large numbers of medications available to treat you. Do not be discouraged if you do not get immediate relief. Sometimes different medications or a combination of medications will be tried before you receive the results you are hoping for. See your caregiver if you have pain that seems to be coming from nowhere and does not go away. Help is available.  SEEK IMMEDIATE MEDICAL CARE IF:   There is a sudden change in the quality of your pain, especially if the change is on  only one side of the body.  You notice changes of the skin, such as redness, black or purple discoloration, swelling, or an ulcer.  You cannot move the affected limbs. Document Released: 11/23/2003 Document Revised: 05/20/2011 Document Reviewed: 11/23/2003 [facility] Patient Information 2014 [place].

## 2013-05-25 NOTE — ED Provider Notes (Signed)
Medical screening examination/treatment/procedure(s) were performed by resident physician or non-physician practitioner and as supervising physician I was immediately available for consultation/collaboration.   Pauline Good MD.   Billy Fischer, MD 05/25/13 [phone]

## 2013-05-28 ENCOUNTER — Encounter: Payer: Self-pay | Admitting: Internal Medicine

## 2013-05-28 ENCOUNTER — Ambulatory Visit: Payer: No Typology Code available for payment source | Attending: Internal Medicine | Admitting: Internal Medicine

## 2013-05-28 VITALS — BP 157/97 | HR 70 | Temp 98.5°F | Resp 16 | Wt 206.4 lb

## 2013-05-28 DIAGNOSIS — I10 Essential (primary) hypertension: Secondary | ICD-10-CM | POA: Insufficient documentation

## 2013-05-28 DIAGNOSIS — G894 Chronic pain syndrome: Secondary | ICD-10-CM

## 2013-05-28 DIAGNOSIS — K219 Gastro-esophageal reflux disease without esophagitis: Secondary | ICD-10-CM | POA: Insufficient documentation

## 2013-05-28 DIAGNOSIS — G589 Mononeuropathy, unspecified: Secondary | ICD-10-CM

## 2013-05-28 DIAGNOSIS — F411 Generalized anxiety disorder: Secondary | ICD-10-CM | POA: Insufficient documentation

## 2013-05-28 DIAGNOSIS — M79609 Pain in unspecified limb: Secondary | ICD-10-CM | POA: Insufficient documentation

## 2013-05-28 DIAGNOSIS — G629 Polyneuropathy, unspecified: Secondary | ICD-10-CM

## 2013-05-28 DIAGNOSIS — F17200 Nicotine dependence, unspecified, uncomplicated: Secondary | ICD-10-CM | POA: Insufficient documentation

## 2013-05-28 DIAGNOSIS — E785 Hyperlipidemia, unspecified: Secondary | ICD-10-CM | POA: Insufficient documentation

## 2013-05-28 MED ORDER — ACETAMINOPHEN-CODEINE #3 300-30 MG PO TABS: 1.0000 | ORAL_TABLET | Freq: Four times a day (QID) | ORAL | Status: DC | PRN

## 2013-05-28 MED ORDER — TRAMADOL HCL 50 MG PO TABS: 50.0000 mg | ORAL_TABLET | Freq: Four times a day (QID) | ORAL | Status: DC | PRN

## 2013-05-28 NOTE — Progress Notes (Signed)
Patient complain of pain in feet and legs The gabapentin helps a little but still having pain

## 2013-05-28 NOTE — Progress Notes (Signed)
MRN: [id number] Name: Mark [name]  Sex: male Age: 48 y.o. DOB: 1965-08-17  Allergies: Lisinopril  Chief Complaint  Patient presents with  . Leg Pain    HPI: Patient is 48 y.o. male who comes today requesting refill on pain medication, he has history of bilateral feet pain and neuropathy, recently went to the urgent care was prescribed Neurontin, Tylenol #3 and tramadol when necessary, as per patient he is taking currently Neurontin 300 mg 3 times a day, still has persistent symptoms, patient has already scheduled appointment with pain management next week, is requesting refill until he is seen by pain management.  Past Medical History  Diagnosis Date  . Hypertension   . Hyperlipidemia   . 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 ETOH abuse   . GERD (gastroesophageal reflux disease)   . Mental disorder   . Irregular heart beat   . Anxiety   . Shortness of breath   . Headache(784.0)   . Hepatitis   . Withdrawal seizures   . Alcohol withdrawal     Past Surgical History  Procedure Laterality Date  . Back surgery    . Neck surgery    . Fundoplasty transthoracic  2003  . Nasal sinus surgery        Medication List       This list is accurate as of: 05/28/13  2:56 PM.  Always use your most recent med list.               acetaminophen-codeine 300-30 MG per tablet  Commonly known as:  TYLENOL #3  Take 1 tablet by mouth every 6 (six) hours as needed for severe pain.     albuterol 108 (90 BASE) MCG/ACT inhaler  Commonly known as:  PROVENTIL HFA;VENTOLIN HFA  Inhale 1-2 puffs into the lungs every 6 (six) hours as needed for wheezing or shortness of breath.     cyclobenzaprine 10 MG tablet  Commonly known as:  FLEXERIL  Take 1 tablet (10 mg total) by mouth 3 (three) times daily.     folic acid 1 MG tablet  Commonly known as:  FOLVITE  Take 1 tablet (1 mg total) by mouth daily.     gabapentin 400 MG capsule  Commonly  known as:  NEURONTIN  Take 2 capsules (800 mg total) by mouth 4 (four) times daily.     gabapentin 300 MG capsule  Commonly known as:  NEURONTIN  Start 300 mg PO qd x 1 day, then 300 mg PO BID x 1 day, then 300 mg TID: Not to exceed 3600 mg a day.     hydrOXYzine 50 MG tablet  Commonly known as:  ATARAX/VISTARIL  Take 1 tablet (50 mg total) by mouth every 6 (six) hours as needed for anxiety.     metoprolol 50 MG tablet  Commonly known as:  LOPRESSOR  Take 1 tablet (50 mg total) by mouth 2 (two) times daily.     pantoprazole 40 MG tablet  Commonly known as:  PROTONIX  Take 1 tablet (40 mg total) by mouth 2 (two) times daily.     QUEtiapine 25 MG tablet  Commonly known as:  SEROQUEL  Take 1 tablet (25 mg total) by mouth every 6 (six) hours as needed (anxiety).     traMADol 50 MG tablet  Commonly known as:  ULTRAM  Take 1 tablet (50 mg total) by mouth every 6 (six) hours  as needed for moderate pain or severe pain.     traZODone 100 MG tablet  Commonly known as:  DESYREL  Take 2 tablets (200 mg total) by mouth at bedtime and may repeat dose one time if needed.        Meds ordered this encounter  Medications  . acetaminophen-codeine (TYLENOL #3) 300-30 MG per tablet    Sig: Take 1 tablet by mouth every 6 (six) hours as needed for severe pain.    Dispense:  10 tablet    Refill:  0    Order Specific Question:  Supervising Provider    Answer:  Chaney Malling P [[id number]]  . traMADol (ULTRAM) 50 MG tablet    Sig: Take 1 tablet (50 mg total) by mouth every 6 (six) hours as needed for moderate pain or severe pain.    Dispense:  15 tablet    Refill:  0    Order Specific Question:  Supervising Provider    Answer:  Chaney Malling P [[id number]]     There is no immunization history on file for this patient.  Family History  Problem Relation Age of Onset  . Breast cancer Paternal Grandmother   . Lung cancer      was a smoker  . Hypertension Mother     History  Substance Use  Topics  . Smoking status: Current Every Day Smoker -- 2.00 packs/day for 30 years    Types: Cigarettes  . Smokeless tobacco: Never Used  . Alcohol Use: 4.2 oz/week    7 Shots of liquor per week     Comment: Pt reports drinking at least a 1/5 of liquor daily for the past month    Review of Systems   As noted in HPI  Filed Vitals:   05/28/13 1434  BP: 157/97  Pulse: 70  Temp: 98.5 F (36.9 C)  Resp: 16    Physical Exam the  Physical Exam  Constitutional: No distress.  Cardiovascular: Normal rate and regular rhythm.   Pulmonary/Chest: Breath sounds normal. No respiratory distress. He has no wheezes. He has no rales.  Musculoskeletal:  Both legs and feet have some mottling, good dorsalis pedis pulses feet slightly colder, no open wounds.  .    CBC    Component Value Date/Time   WBC 6.7 01/21/2013 0640   RBC 5.46 01/21/2013 0640   HGB 15.2 01/21/2013 0640   HCT 44.2 01/21/2013 0640   PLT 237 01/21/2013 0640   MCV 81.0 01/21/2013 0640   LYMPHSABS 2.3 11/07/2012 2141   MONOABS 0.4 11/07/2012 2141   EOSABS 0.1 11/07/2012 2141   BASOSABS 0.0 11/07/2012 2141    CMP     Component Value Date/Time   NA 139 01/20/2013 1525   K 3.8 01/20/2013 1525   CL 95* 01/20/2013 1525   CO2 26 01/20/2013 1525   GLUCOSE 132* 01/20/2013 1525   BUN 9 01/20/2013 1525   CREATININE 0.71 01/20/2013 1525   CALCIUM 9.4 01/20/2013 1525   PROT 7.8 01/20/2013 1525   ALBUMIN 4.3 01/20/2013 1525   AST 30 01/20/2013 1525   ALT 22 01/20/2013 1525   ALKPHOS 106 01/20/2013 1525   BILITOT 0.4 01/20/2013 1525   GFRNONAA >90 01/20/2013 1525   GFRAA >90 01/20/2013 1525    No results found for this basename: chol, tri, ldl    No components found with this basename: hga1c    Lab Results  Component Value Date/Time   AST 30 01/20/2013  3:25 PM  Assessment and Plan  Chronic pain syndrome - Plan: acetaminophen-codeine (TYLENOL #3) 300-30 MG per tablet, traMADol (ULTRAM) 50 MG  tablet  Neuropathy - Plan: acetaminophen-codeine (TYLENOL #3) 300-30 MG per tablet, traMADol (ULTRAM) 50 MG tablet  I have advised patient to increase Neurontin 300 mg 2 pills 3 times a day, I have given him refill on tramadol and Tylenol #3 he is scheduled to see his pain management doctor next week.  Return if symptoms worsen or fail to improve.  Lorayne Marek, MD

## 2013-06-07 ENCOUNTER — Ambulatory Visit (HOSPITAL_COMMUNITY)
Admission: RE | Admit: 2013-06-07 | Discharge: 2013-06-07 | Disposition: A | Discharge status: Home / Self Care | Payer: No Typology Code available for payment source | Attending: Psychiatry | Admitting: Psychiatry

## 2013-06-07 ENCOUNTER — Emergency Department (HOSPITAL_COMMUNITY)
Admission: EM | Admit: 2013-06-07 | Discharge: 2013-06-08 | Disposition: A | Discharge status: Home / Self Care | Payer: No Typology Code available for payment source | Attending: Emergency Medicine | Admitting: Emergency Medicine

## 2013-06-07 ENCOUNTER — Encounter (HOSPITAL_COMMUNITY): Payer: Self-pay | Admitting: *Deleted

## 2013-06-07 ENCOUNTER — Encounter (HOSPITAL_COMMUNITY): Payer: Self-pay | Admitting: Emergency Medicine

## 2013-06-07 ENCOUNTER — Emergency Department (HOSPITAL_COMMUNITY): Payer: No Typology Code available for payment source

## 2013-06-07 DIAGNOSIS — F1020 Alcohol dependence, uncomplicated: Secondary | ICD-10-CM | POA: Insufficient documentation

## 2013-06-07 DIAGNOSIS — F411 Generalized anxiety disorder: Secondary | ICD-10-CM | POA: Insufficient documentation

## 2013-06-07 DIAGNOSIS — G629 Polyneuropathy, unspecified: Secondary | ICD-10-CM

## 2013-06-07 DIAGNOSIS — Z79899 Other long term (current) drug therapy: Secondary | ICD-10-CM | POA: Insufficient documentation

## 2013-06-07 DIAGNOSIS — G589 Mononeuropathy, unspecified: Secondary | ICD-10-CM | POA: Insufficient documentation

## 2013-06-07 DIAGNOSIS — F32A Depression, unspecified: Secondary | ICD-10-CM

## 2013-06-07 DIAGNOSIS — I10 Essential (primary) hypertension: Secondary | ICD-10-CM | POA: Insufficient documentation

## 2013-06-07 DIAGNOSIS — M79609 Pain in unspecified limb: Secondary | ICD-10-CM | POA: Insufficient documentation

## 2013-06-07 DIAGNOSIS — F329 Major depressive disorder, single episode, unspecified: Secondary | ICD-10-CM | POA: Insufficient documentation

## 2013-06-07 DIAGNOSIS — Z8719 Personal history of other diseases of the digestive system: Secondary | ICD-10-CM | POA: Insufficient documentation

## 2013-06-07 DIAGNOSIS — Z862 Personal history of diseases of the blood and blood-forming organs and certain disorders involving the immune mechanism: Secondary | ICD-10-CM | POA: Insufficient documentation

## 2013-06-07 DIAGNOSIS — F3289 Other specified depressive episodes: Secondary | ICD-10-CM | POA: Insufficient documentation

## 2013-06-07 DIAGNOSIS — F17200 Nicotine dependence, unspecified, uncomplicated: Secondary | ICD-10-CM | POA: Insufficient documentation

## 2013-06-07 DIAGNOSIS — Z8639 Personal history of other endocrine, nutritional and metabolic disease: Secondary | ICD-10-CM | POA: Insufficient documentation

## 2013-06-07 HISTORY — DX: Polyneuropathy, unspecified: G62.9

## 2013-06-07 LAB — CBC
HCT: 48 % (ref 39.0–52.0)
Hemoglobin: 17.2 g/dL — ABNORMAL HIGH (ref 13.0–17.0)
MCH: 28.2 pg (ref 26.0–34.0)
MCHC: 35.8 g/dL (ref 30.0–36.0)
MCV: 78.6 fL (ref 78.0–100.0)
PLATELETS: 280 10*3/uL (ref 150–400)
RBC: 6.11 MIL/uL — AB (ref 4.22–5.81)
RDW: 14 % (ref 11.5–15.5)
WBC: 7.2 10*3/uL (ref 4.0–10.5)

## 2013-06-07 LAB — PROTIME-INR
INR: 1.04 (ref 0.00–1.49)
Prothrombin Time: 13.4 seconds (ref 11.6–15.2)

## 2013-06-07 MED ORDER — ASPIRIN 81 MG PO CHEW
324.0000 mg | CHEWABLE_TABLET | Freq: Once | ORAL | Status: AC
  Administered 2013-06-07: 324 mg via ORAL

## 2013-06-07 MED ORDER — SODIUM CHLORIDE 0.9 % IV SOLN
1000.0000 mL | Freq: Once | INTRAVENOUS | Status: AC
  Administered 2013-06-07: 1000 mL via INTRAVENOUS

## 2013-06-07 MED ORDER — SODIUM CHLORIDE 0.9 % IV SOLN
1000.0000 mL | INTRAVENOUS | Status: DC
  Administered 2013-06-08: 1000 mL via INTRAVENOUS

## 2013-06-07 MED ORDER — HYDROMORPHONE HCL PF 1 MG/ML IJ SOLN
1.0000 mg | Freq: Once | INTRAMUSCULAR | Status: AC
  Administered 2013-06-07: 1 mg via INTRAVENOUS
  Filled 2013-06-07: qty 1

## 2013-06-07 MED ORDER — KETOROLAC TROMETHAMINE 30 MG/ML IJ SOLN
30.0000 mg | Freq: Once | INTRAMUSCULAR | Status: AC
  Administered 2013-06-07: 30 mg via INTRAVENOUS
  Filled 2013-06-07: qty 1

## 2013-06-07 MED ORDER — LORAZEPAM 2 MG/ML IJ SOLN
1.0000 mg | Freq: Once | INTRAMUSCULAR | Status: AC
  Administered 2013-06-07: 1 mg via INTRAVENOUS
  Filled 2013-06-07: qty 1

## 2013-06-07 MED ORDER — ONDANSETRON HCL 4 MG/2ML IJ SOLN
4.0000 mg | Freq: Once | INTRAMUSCULAR | Status: AC
  Administered 2013-06-07: 4 mg via INTRAVENOUS
  Filled 2013-06-07: qty 2

## 2013-06-07 MED ORDER — VITAMIN B-1 100 MG PO TABS
50.0000 mg | ORAL_TABLET | Freq: Once | ORAL | Status: AC
  Administered 2013-06-07: 50 mg via ORAL
  Filled 2013-06-07: qty 1

## 2013-06-07 MED ORDER — GABAPENTIN 300 MG PO CAPS
300.0000 mg | ORAL_CAPSULE | Freq: Once | ORAL | Status: AC
  Administered 2013-06-07: 300 mg via ORAL
  Filled 2013-06-07: qty 1

## 2013-06-07 NOTE — BH Assessment (Signed)
Assessment Note  Mark [name] is a 48 y.o. separated white male.  He presents at [facility] unaccompanied, having driven himself here.  His emotional and physical distress are too great for him to fill out an intake form, and he cries and doubles up in pain throughout assessment.  This Probation officer obtained demographic information, as well as completing assessment.  Pt reports that he is here today because, "I'm an alcoholic.  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  He reports that he used to work as a Development worker, community, but sustained injuries on the job and has been out of the workforce for years.  He has been unable to obtain disability benefits.  He reports that he drinks alcohol in episodic binges in order to cope with the pain.  He also reports that his wife of 20 years separated from him about 3 years ago at her initiative, with no likelihood of reconciliation.  Pt states, "I don't blame her."  Lethality: Suicidality:  Pt denies active SI currently or at any time in the past.  He denies any history of suicide attempts, or of self mutilation.  He reports that over the past couple days he has felt that he would be better off dead.  Pt endorses depressed mood with symptoms noted in the "risk to self" assessment below. Homicidality: Pt denies homicidal thoughts or physical aggression.  Pt endorses having access to firearms.  Pt denies having any legal problems at this time.  Pt is calm and cooperative during assessment. Psychosis: Pt denies hallucinations.  Pt does not appear to be responding to internal stimuli and exhibits no delusional thought.  Pt's reality testing appears to be intact. Substance Abuse: Pt reports that for the past year or more he has binged on alcohol in similar fashion to today.  For the past 4 or 5 days he has been drinking about a "fifth" of liquor daily, and today he drank about 6 bottled alcoholic beverages.  He denies any other  substance abuse history, but reports that reports that tomorrow (06/08/2013) will be his first anniversary off of methadone, prescribed by his PCP strictly for pain management.  He reports that he recently obtained Tylenol 3 (with codeine), which he has been using as prescribed for pain management.  Pt endorses history of alcohol withdrawal seizures as recently as 2 months ago, and reports current withdrawal symptoms as noted in the "Additional Social History" section below.  Social History: Pt identifies his mother and his step-father, with both of whom he lives, as his main social supports.  He reports that his wife holds his advance directives.  They have two children, ages 64 and 22 y/o.  Pt's father, who also was a Development worker, community and also had problems with alcohol, is now deceased.  Pt denies any history of abuse.  Treatment History: Pt was admitted to [facility] for detoxification on 01/21/2013, his only history of psychiatric hospitalization.  He has been a [employer] manager, but stopped going 2 - 3 months ago, reporting that it did not do him any good.  He denies any other treatment history, and he has never attended 12-Step meetings, saying he already knows that he should stop drinking.  Today pt is asking to be admitted to a facility for alcohol detoxification.   Axis I: Alcohol Dependence 303.90; Depressive Disorder NOS 311; Anxiety Disorder NOS 300.00 Axis II: Deferred 799.9 Axis III:  Past Medical History  Diagnosis  Date  . Hypertension   . Hyperlipidemia   . 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 ETOH abuse   . GERD (gastroesophageal reflux disease)   . Mental disorder   . Irregular heart beat   . Anxiety   . Shortness of breath   . Headache(784.0)   . Hepatitis   . Withdrawal seizures   . Alcohol withdrawal   . Neuropathy 06/07/2013   Axis IV: economic problems, occupational problems, problems with access to health care services, problems with  primary support group and general medical problems Axis V: GAF = 40  Past Medical History:  Past Medical History  Diagnosis Date  . Hypertension   . Hyperlipidemia   . 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 ETOH abuse   . GERD (gastroesophageal reflux disease)   . Mental disorder   . Irregular heart beat   . Anxiety   . Shortness of breath   . Headache(784.0)   . Hepatitis   . Withdrawal seizures   . Alcohol withdrawal   . Neuropathy 06/07/2013    Past Surgical History  Procedure Laterality Date  . Back surgery    . Neck surgery    . Fundoplasty transthoracic  2003  . Nasal sinus surgery      Family History:  Family History  Problem Relation Age of Onset  . Breast cancer Paternal Grandmother   . Lung cancer      was a smoker  . Hypertension Mother     Social History:  reports that he has been smoking Cigarettes.  He has a 15 pack-year smoking history. He has never used smokeless tobacco. He reports that he drinks about 4.2 ounces of alcohol per week. He reports that he uses illicit drugs.  Additional Social History:  Alcohol / Drug Use Pain Medications: Reports taking Tylenol 3 as prescribed; Hx of methadone prescribed for pain by PCP 1 year ago. Prescriptions: Denies any abuse of prescriptions Over the Counter: Denies Longest period of sobriety (when/how long): "Months at a time;" cannot specify when Negative Consequences of Use: Financial;Personal relationships Withdrawal Symptoms: Cramps;Fever / Chills;Tremors;Tingling;Seizures;Nausea / Vomiting;Agitation;DTs;Sweats Onset of Seizures: Unspecified Date of most recent seizure: 2 months ago Substance #1 Name of Substance 1: Alcohol 1 - Age of First Use: 38 1 - Amount (size/oz): A "fifth" of liquor 1 - Frequency: daily 1 - Duration: past 4 - 5 days with similar binges when money is available for over 1 year 1 - Last Use / Amount: 6 bottled beverages today  CIWA:  CIWA-Ar Nausea and Vomiting: 6 Tactile Disturbances: moderate itching, pins and needles, burning or numbness Tremor: not visible, but can be felt fingertip to fingertip Auditory Disturbances: not present Paroxysmal Sweats: barely perceptible sweating, palms moist Visual Disturbances: mild sensitivity Anxiety: five Headache, Fullness in Head: severe Agitation: five Orientation and Clouding of Sensorium: oriented and can do serial additions CIWA-Ar Total: 28 COWS:    Allergies:  Allergies  Allergen Reactions  . Lisinopril Anaphylaxis and Swelling    Home Medications:  (Not in a hospital admission)  OB/GYN Status:  No LMP for male patient.  General Assessment Data Location of Assessment: [facility] Is this a Tele or Face-to-Face Assessment?: Face-to-Face Is this an Initial Assessment or a Re-assessment for this encounter?: Initial Assessment Living Arrangements: Parent (Mother & step-father) Can pt return to current living arrangement?: Yes Admission  Status: Voluntary Is patient capable of signing voluntary admission?: Yes Transfer from: Home Referral Source: Self/Family/Friend  Medical Screening Exam ([place]) Medical Exam completed: No Reason for MSE not completed: Other: (Transfer to [place] by EMS)  [place] Plan Living Arrangements: Parent (Mother & step-father) Name of Psychiatrist: None Name of Therapist: None  Education Status Is patient currently in school?: No 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 person: Ricarda Frame (mother) [phone]  Risk to self Suicidal Ideation: No Suicidal Intent: No Is patient at risk for suicide?: No Suicidal Plan?: No Access to Means: No What has been your use of drugs/alcohol within the last 12 months?: Binging on alcohol Previous Attempts/Gestures: No How many times?: 0 Other Self Harm Risks: Hx of feeling he would be better off dead for the past few days;  wife & adult children serve as deterrent Triggers for Past Attempts: Other (Comment) (Not applicable) Intentional Self Injurious Behavior: None Family Suicide History: No Recent stressful life event(s): Recent negative physical changes;Other (Comment);Financial Problems (Chronic pain; separated x 3 years; can't afford medications.) Persecutory voices/beliefs?: No Depression: Yes Depression Symptoms: Despondent;Insomnia;Tearfulness;Fatigue;Guilt;Loss of interest in usual pleasures;Feeling worthless/self pity (Hopelessness) Substance abuse history and/or treatment for substance abuse?: Yes (Binging on alcohol) Suicide prevention information given to non-admitted patients: Yes  Risk to Others Homicidal Ideation: No Thoughts of Harm to Others: No Current Homicidal Intent: No Current Homicidal Plan: No Access to Homicidal Means: No Identified Victim: None History of harm to others?: No Assessment of Violence: None Noted Violent Behavior Description: Calm & cooperative, tearful Does patient have access to weapons?: Yes (Comment) (Pt has firearms) Criminal Charges Pending?: No (Possibility of debts in collections) Does patient have a court date: No  Psychosis Hallucinations: None noted Delusions: None noted  Mental Status Report Appear/Hygiene: Disheveled;Other (Comment);Body odor (Unshaven) Eye Contact: Poor Motor Activity: Other (Comment) (Episodically bending over in pain) Speech: Other (Comment) (Unremarkable) Level of Consciousness: Alert Mood: Depressed;Anxious Affect: Appropriate to circumstance Anxiety Level: Panic Attacks Panic attack frequency: Daily Most recent panic attack: Today (06/07/2013) Thought Processes: Coherent;Relevant Judgement: Unimpaired Orientation: Place;Person;Time;Situation Obsessive Compulsive Thoughts/Behaviors: None  Cognitive Functioning Concentration: Decreased Memory: Recent Intact;Remote Intact IQ: Average Insight: Good Impulse Control:  Fair Appetite: Poor Weight Loss: 0 Weight Gain: 0 Sleep: Decreased Total Hours of Sleep: 3 (2 - 3 hrs/night x months) Vegetative Symptoms: Staying in bed;Decreased grooming;Not bathing (Unshaven)  ADLScreening [facility] Assessment Services) Patient's cognitive ability adequate to safely complete daily activities?: Yes Patient able to express need for assistance with ADLs?: Yes Independently performs ADLs?: Yes (appropriate for developmental age)  Prior Inpatient Therapy Prior Inpatient Therapy: Yes Prior Therapy Dates: 01/21/2013: [facility] for detox  Prior Outpatient Therapy Prior Outpatient Therapy: Yes Prior Therapy Dates: 2 - 3 months ago: [facility] Prior Therapy Facilty/Provider(s): Denies any history of 12-Step meetings  ADL Screening (condition at time of admission) Patient's cognitive ability adequate to safely complete daily activities?: Yes Is the patient deaf or have difficulty hearing?: No Does the patient have difficulty seeing, even when wearing glasses/contacts?: No Does the patient have difficulty concentrating, remembering, or making decisions?: No Patient able to express need for assistance with ADLs?: Yes Does the patient have difficulty dressing or bathing?: No Independently performs ADLs?: Yes (appropriate for developmental age) Does the patient have difficulty walking or climbing stairs?: No Weakness of Legs: None Weakness of Arms/Hands: None  Home Assistive Devices/Equipment Home Assistive Devices/Equipment: Eyeglasses    Abuse/Neglect Assessment (Assessment to be complete while patient  is alone) Physical Abuse: Denies Verbal Abuse: Denies Sexual Abuse: Denies Exploitation of patient/patient's resources: Denies Self-Neglect: Denies     Regulatory affairs officer (For Healthcare) Advance Directive: Patient has advance directive, copy not in chart Type of Advance Directive: [place] Directive not in Chart:  (Copy requested from  patient) Nutrition Screen- [place] Adult/WL/AP Patient's home diet: Regular  Additional Information 1:1 In Past 12 Months?: No CIRT Risk: No Elopement Risk: No Does patient have medical clearance?: No     Disposition:  Disposition Initial Assessment Completed for this Encounter: Yes Disposition of Patient: Other dispositions Other disposition(s):  (Transferred to [facility] by EMS, per Satira Anis, PA) After consulting with Patriciaann Clan, PA it has been determined that pt does not present a life threatening danger to himself of others due to reasons related to mental health.  However, pt is in acute distress, including chest pain.  He also would benefit from detoxification from alcohol.  Frederico Hamman and Debarah Crape, RN, [facility], examined pt and took vital signs, then called EMS to arrange for transport to [facility].  Pt departed by EMS at 20:21.  At 20:32 I spoke to [facility], Therapist, sports, Camera operator at [employer] and gave report.  On Site Evaluation by:   Reviewed with Physician:  Patriciaann Clan, [place] @ 19:52  [name], [place] Triage Specialist Abbe [place] 06/07/2013 8:25 PM

## 2013-06-07 NOTE — ED Notes (Signed)
No changes, remains restless, moderately anxious, mildly aggitated and frustrated.

## 2013-06-07 NOTE — ED Notes (Signed)
Using phone to call family.

## 2013-06-07 NOTE — ED Provider Notes (Addendum)
CSN: [id number]     Arrival date & time 06/07/13  2040 History   First MD Initiated Contact with Patient 06/07/13 2043     Chief Complaint  Patient presents with  . Chest Pain     (Consider location/radiation/quality/duration/timing/severity/associated sxs/prior Treatment) HPI Patient presents with concern of chest pressure.  The patient has been pain has been present for at least 3 days, though increasingly more severe. To clarify the past he states that he has had similar pain for a long time, though it has been present for 3 days constantly this episode. Patient also complains of generalized discomfort, including severe lower extremity pain.  Lung patient's previously been diagnosed with neuropathy. He currently denies any dyspnea, lightheadedness, syncope, nausea, vomiting, fever, chills, cough. Patient endorses a history of alcohol abuse, drinking a fifth of liquor each day. He denies depression, suicidal intention.  Past Medical History  Diagnosis Date  . Hypertension   . Hyperlipidemia   . 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 ETOH abuse   . GERD (gastroesophageal reflux disease)   . Mental disorder   . Irregular heart beat   . Anxiety   . Shortness of breath   . Headache(784.0)   . Hepatitis   . Withdrawal seizures   . Alcohol withdrawal   . Neuropathy 06/07/2013   Past Surgical History  Procedure Laterality Date  . Back surgery    . Neck surgery    . Fundoplasty transthoracic  2003  . Nasal sinus surgery     Family History  Problem Relation Age of Onset  . Breast cancer Paternal Grandmother   . Lung cancer      was a smoker  . Hypertension Mother    History  Substance Use Topics  . Smoking status: Current Every Day Smoker -- 0.50 packs/day for 30 years    Types: Cigarettes  . Smokeless tobacco: Never Used  . Alcohol Use: 4.2 oz/week    7 Shots of liquor per week     Comment: Pt reports drinking at least a 1/5 of  liquor daily for the past month    Review of Systems  Constitutional:       Per HPI, otherwise negative  HENT:       Per HPI, otherwise negative  Respiratory:       Per HPI, otherwise negative  Cardiovascular:       Per HPI, otherwise negative  Gastrointestinal: Negative for vomiting.  Endocrine:       Negative aside from HPI  Genitourinary:       Neg aside from HPI   Musculoskeletal:       Per HPI, otherwise negative  Skin: Negative.   Neurological: Negative for syncope.      Allergies  Lisinopril  Home Medications   Current Outpatient Rx  Name  Route  Sig  Dispense  Refill  . albuterol (PROVENTIL HFA;VENTOLIN HFA) 108 (90 BASE) MCG/ACT inhaler   Inhalation   Inhale 1-2 puffs into the lungs every 6 (six) hours as needed for wheezing or shortness of breath.   1 Inhaler   0   . gabapentin (NEURONTIN) 300 MG capsule   Oral   Take 300 mg by mouth 3 (three) times daily.         . metoprolol (LOPRESSOR) 50 MG tablet   Oral   Take 1 tablet (50 mg total) by mouth 2 (two) times daily.   60 tablet  0    BP 123/60  Pulse 66  Temp(Src) 97.9 F (36.6 C) (Oral)  Resp 22  SpO2 100% Physical Exam  Nursing note and vitals reviewed. Constitutional: He is oriented to person, place, and time. He appears well-developed. No distress.  HENT:  Head: Normocephalic and atraumatic.  Eyes: Conjunctivae and EOM are normal.  Cardiovascular: Normal rate and regular rhythm.   Pulmonary/Chest: Effort normal. No stridor. No respiratory distress. He has no wheezes. He has no rales.  Abdominal: He exhibits no distension. There is no tenderness.  Musculoskeletal: He exhibits no edema.  Neurological: He is alert and oriented to person, place, and time. He displays no atrophy and no tremor. No cranial nerve deficit or sensory deficit. He exhibits normal muscle tone. He displays no seizure activity.  Skin: Skin is warm and dry.  Psychiatric: His speech is normal and behavior is normal.  His mood appears anxious. He expresses no suicidal ideation. He expresses no suicidal plans.    ED Course  Procedures (including critical care time) Labs Review Labs Reviewed  CBC - Abnormal; Notable for the following:    RBC 6.11 (*)    Hemoglobin 17.2 (*)    All other components within normal limits  PROTIME-INR  COMPREHENSIVE METABOLIC PANEL  PRO B NATRIURETIC PEPTIDE  FOLATE   Imaging Review Dg Chest 2 View  06/07/2013   CLINICAL DATA:  Chest pain and cough.  EXAM: CHEST  2 VIEW  COMPARISON:  01/20/2013  FINDINGS: Heart size and pulmonary vascularity are normal and the lungs are clear except for a tiny area scarring at the left lung base laterally. No effusions. No acute osseous abnormalities.  IMPRESSION: No acute disease.   Electronically Signed   By: Rozetta Nunnery M.D.   On: 06/07/2013 21:59     EKG Interpretation   Date/Time:  Monday June 07 2013 20:46:18 EDT Ventricular Rate:  91 PR Interval:  156 QRS Duration: 108 QT Interval:  398 QTC Calculation: 490 R Axis:   18 Text Interpretation:  Sinus tachycardia Ventricular trigeminy Minimal ST  depression, inferior leads Borderline prolonged QT interval Sinus  tachycardia Premature ventricular complexes T wave abnormality Abnormal  ekg Confirmed by Carmin Muskrat  MD ([id number]) on 06/07/2013 8:57:37 PM      12:41 AM On repeat exam the patient appears more comfortable.  He is sitting upright, has no chest pain.  He does continue to complain of lower extremity pain.  He now requests assistance with detox from alcohol, and with assistance with this overwhelming depression. Patient has been seen by [facility] today, and was referred here for medical clearance.  Given the patient's resolution of chest pain, is nonischemic EKG, he is medically clear for further psychiatric evaluation.   MDM   Patient presents with chest pain, request for assistance with depression.  Patient's labs are reassuring, chest pain results, and patient  acknowledges a history of chronic pain.  Patient's improvement, reassuring labs, stable vital signs making adequate clear for further psychiatric evaluation.    Carmin Muskrat, MD 06/08/13 0043  12:46 AM I discussed his case w [facility]; they are working on placement.  Carmin Muskrat, MD 06/08/13 [phone]

## 2013-06-07 NOTE — ED Notes (Addendum)
Pt reports to the ED via [facility] for eval of substernal chest pressure that he describes as an "elephant sitting on his chest." Pt reports hx of atrial fibrillation and V-fib but he does not take any medication for either condition. Pt also reports some nausea, SOB, and dizziness upon standing. Pt denies any active vomiting, or diaphoresis. 12 lead showed NSR with multiple PVCs. Pt received 324 of ASA and 1 nitro PTA. Pt reports the pain is worse with coughing and deep breaths. Pt reports he has had this pain for several months but it has been increasingly painful in the last 3 days. Pt admits to having 6 or 7 mike's hard lemonade. He was picked up from [facility] where he was attempting to check in for ETOH detox. Pt A&O x4, resp e/u, and skin warm and dry.

## 2013-06-08 ENCOUNTER — Encounter (HOSPITAL_COMMUNITY): Payer: Self-pay | Admitting: Emergency Medicine

## 2013-06-08 LAB — COMPREHENSIVE METABOLIC PANEL
ALT: 14 U/L (ref 0–53)
AST: 19 U/L (ref 0–37)
Albumin: 4 g/dL (ref 3.5–5.2)
Alkaline Phosphatase: 92 U/L (ref 39–117)
BUN: 9 mg/dL (ref 6–23)
CO2: 30 mEq/L (ref 19–32)
Calcium: 8.7 mg/dL (ref 8.4–10.5)
Chloride: 100 mEq/L (ref 96–112)
Creatinine, Ser: 0.83 mg/dL (ref 0.50–1.35)
GFR calc Af Amer: >90 mL/min (ref >90)
GFR calc non Af Amer: >90 mL/min (ref >90)
Glucose, Bld: 96 mg/dL (ref 70–99)
Potassium: 3.4 mEq/L — ABNORMAL LOW (ref 3.7–5.3)
SODIUM: 149 meq/L — AB (ref 137–147)
TOTAL PROTEIN: 6.9 g/dL (ref 6.0–8.3)
Total Bilirubin: 0.6 mg/dL (ref 0.3–1.2)

## 2013-06-08 LAB — RAPID URINE DRUG SCREEN, HOSP PERFORMED
Amphetamines: NOT DETECTED
BARBITURATES: NOT DETECTED
Benzodiazepines: NOT DETECTED
Cocaine: NOT DETECTED
Opiates: NOT DETECTED
Tetrahydrocannabinol: NOT DETECTED

## 2013-06-08 LAB — I-STAT TROPONIN, ED: Troponin i, poc: 0.01 ng/mL (ref 0.00–0.08)

## 2013-06-08 LAB — ETHANOL: Alcohol, Ethyl (B): 94 mg/dL — ABNORMAL HIGH (ref 0–11)

## 2013-06-08 LAB — FOLATE: FOLATE: 3 ng/mL — AB

## 2013-06-08 LAB — PRO B NATRIURETIC PEPTIDE: Pro B Natriuretic peptide (BNP): 49.2 pg/mL (ref 0–125)

## 2013-06-08 MED ORDER — LORAZEPAM 1 MG PO TABS
0.0000 mg | ORAL_TABLET | Freq: Four times a day (QID) | ORAL | Status: DC
  Administered 2013-06-08 (×2): 2 mg via ORAL
  Administered 2013-06-08 (×2): 1 mg via ORAL
  Filled 2013-06-08: qty 1
  Filled 2013-06-08 (×2): qty 2
  Filled 2013-06-08: qty 1

## 2013-06-08 MED ORDER — ALBUTEROL SULFATE HFA 108 (90 BASE) MCG/ACT IN AERS: 1.0000 | INHALATION_SPRAY | Freq: Four times a day (QID) | RESPIRATORY_TRACT | Status: DC | PRN

## 2013-06-08 MED ORDER — POTASSIUM CHLORIDE CRYS ER 20 MEQ PO TBCR
40.0000 meq | EXTENDED_RELEASE_TABLET | Freq: Once | ORAL | Status: AC
  Administered 2013-06-08: 40 meq via ORAL
  Filled 2013-06-08: qty 2

## 2013-06-08 MED ORDER — ACETAMINOPHEN 325 MG PO TABS
650.0000 mg | ORAL_TABLET | ORAL | Status: DC | PRN
  Administered 2013-06-08 (×3): 650 mg via ORAL
  Filled 2013-06-08 (×3): qty 2

## 2013-06-08 MED ORDER — ONDANSETRON HCL 4 MG PO TABS
4.0000 mg | ORAL_TABLET | Freq: Three times a day (TID) | ORAL | Status: DC | PRN
  Administered 2013-06-08 (×2): 4 mg via ORAL
  Filled 2013-06-08 (×2): qty 1

## 2013-06-08 MED ORDER — THIAMINE HCL 100 MG/ML IJ SOLN: 100.0000 mg | Freq: Every day | INTRAMUSCULAR | Status: DC

## 2013-06-08 MED ORDER — METOPROLOL TARTRATE 25 MG PO TABS
50.0000 mg | ORAL_TABLET | Freq: Two times a day (BID) | ORAL | Status: DC
  Administered 2013-06-08: 50 mg via ORAL
  Filled 2013-06-08: qty 2

## 2013-06-08 MED ORDER — ZOLPIDEM TARTRATE 5 MG PO TABS
5.0000 mg | ORAL_TABLET | Freq: Once | ORAL | Status: AC
  Administered 2013-06-08: 5 mg via ORAL
  Filled 2013-06-08: qty 1

## 2013-06-08 MED ORDER — VITAMIN B-1 100 MG PO TABS
100.0000 mg | ORAL_TABLET | Freq: Every day | ORAL | Status: DC
  Administered 2013-06-08: 100 mg via ORAL
  Filled 2013-06-08: qty 1

## 2013-06-08 MED ORDER — LORAZEPAM 1 MG PO TABS: 0.0000 mg | ORAL_TABLET | Freq: Two times a day (BID) | ORAL | Status: DC

## 2013-06-08 MED ORDER — METOPROLOL TARTRATE 50 MG PO TABS: 50.0000 mg | ORAL_TABLET | Freq: Two times a day (BID) | ORAL | Status: DC

## 2013-06-08 MED ORDER — GABAPENTIN 300 MG PO CAPS
300.0000 mg | ORAL_CAPSULE | Freq: Three times a day (TID) | ORAL | Status: DC
  Administered 2013-06-08 (×2): 300 mg via ORAL
  Filled 2013-06-08 (×2): qty 1

## 2013-06-08 MED ORDER — ALBUTEROL SULFATE HFA 108 (90 BASE) MCG/ACT IN AERS
2.0000 | INHALATION_SPRAY | RESPIRATORY_TRACT | Status: DC | PRN
  Filled 2013-06-08: qty 6.7

## 2013-06-08 MED ORDER — ALBUTEROL SULFATE (2.5 MG/3ML) 0.083% IN NEBU: 2.5000 mg | INHALATION_SOLUTION | Freq: Four times a day (QID) | RESPIRATORY_TRACT | Status: DC | PRN

## 2013-06-08 MED ORDER — GABAPENTIN 300 MG PO CAPS: 300.0000 mg | ORAL_CAPSULE | Freq: Three times a day (TID) | ORAL | Status: DC

## 2013-06-08 NOTE — Discharge Instructions (Signed)
Polysubstance Abuse °When people abuse more than one drug or type of drug it is called polysubstance or polydrug abuse. For example, many smokers also drink alcohol. This is one form of polydrug abuse. Polydrug abuse also refers to the use of a drug to counteract an unpleasant effect produced by another drug. It may also be used to help with withdrawal from another drug. People who take stimulants may become agitated. Sometimes this agitation is countered with a tranquilizer. This helps protect against the unpleasant side effects. Polydrug abuse also refers to the use of different drugs at the same time.  °Anytime drug use is interfering with normal living activities, it has become abuse. This includes problems with family and friends. Psychological dependence has developed when your mind tells you that the drug is needed. This is usually followed by physical dependence which has developed when continuing increases of drug are required to get the same feeling or "high". This is known as addiction or chemical dependency. A person's risk is much higher if there is a history of chemical dependency in the family. °SIGNS OF CHEMICAL DEPENDENCY °· You have been told by friends or family that drugs have become a problem. °· You fight when using drugs. °· You are having blackouts (not remembering what you do while using). °· You feel sick from using drugs but continue using. °· You lie about use or amounts of drugs (chemicals) used. °· You need chemicals to get you going. °· You are suffering in work performance or in school because of drug use. °· You get sick from use of drugs but continue to use anyway. °· You need drugs to relate to people or feel comfortable in social situations. °· You use drugs to forget problems. °"Yes" answered to any of the above signs of chemical dependency indicates there are problems. The longer the use of drugs continues, the greater the problems will become. °If there is a family history of  drug or alcohol use, it is best not to experiment with these drugs. Continual use leads to tolerance. After tolerance develops more of the drug is needed to get the same feeling. This is followed by addiction. With addiction, drugs become the most important part of life. It becomes more important to take drugs than participate in the other usual activities of life. This includes relating to friends and family. Addiction is followed by dependency. Dependency is a condition where drugs are now needed not just to get high, but to feel normal. °Addiction cannot be cured but it can be stopped. This often requires outside help and the care of professionals. Treatment centers are listed in the yellow pages under: Cocaine, Narcotics, and Alcoholics Anonymous. Most hospitals and clinics can refer you to a specialized care center. Talk to your caregiver if you need help. °Document Released: 10/17/2004 Document Revised: 05/20/2011 Document Reviewed: 02/25/2005 °ExitCare® Patient Information ©2014 [employer]. ° °

## 2013-06-08 NOTE — ED Notes (Signed)
All personal items were placed in holding area, patient valuables were lock up in security

## 2013-06-08 NOTE — Progress Notes (Signed)
Per, [name] [address] [employer] at [facility].  [facility] will refer patient to other facilities.

## 2013-06-08 NOTE — ED Notes (Signed)
Dr Lockwood in to see pt 

## 2013-06-08 NOTE — Progress Notes (Signed)
B.Zakiya Sporrer, MHT seeking placement for patient by contacting the following facilities;  [facility] spoke with [facility] referral faxed [facility] referral faxed [facility] no response  Elmyra Ricks from [facility] reports patient has been accepted and will need home medication supply of three days. Writer requested assistance from [facility] social work to provide patient with three day supply of medications to assist while in detox. [facility] reports that once patient is ready for transfer and has medications filled to notify them. Writer informed attending RN, Jacqlyn Larsen of disposition and she will facilitate transfer via [place]. Accepting physician for [facility] is Dr. Rosine Door.

## 2013-06-09 ENCOUNTER — Emergency Department: Payer: Self-pay | Admitting: Emergency Medicine

## 2013-06-09 LAB — CBC
HCT: 44.9 % (ref 40.0–52.0)
HGB: 15.3 g/dL (ref 13.0–18.0)
MCH: 27.2 pg (ref 26.0–34.0)
MCHC: 34 g/dL (ref 32.0–36.0)
MCV: 80 fL (ref 80–100)
PLATELETS: 202 10*3/uL (ref 150–440)
RBC: 5.62 10*6/uL (ref 4.40–5.90)
RDW: 14.5 % (ref 11.5–14.5)
WBC: 6.3 10*3/uL (ref 3.8–10.6)

## 2013-06-09 LAB — BASIC METABOLIC PANEL
Anion Gap: 6 — ABNORMAL LOW (ref 7–16)
BUN: 9 mg/dL (ref 7–18)
CHLORIDE: 105 mmol/L (ref 98–107)
CREATININE: 1.11 mg/dL (ref 0.60–1.30)
Calcium, Total: 8.6 mg/dL (ref 8.5–10.1)
Co2: 27 mmol/L (ref 21–32)
EGFR (African American): >60
EGFR (Non-African Amer.): >60
GLUCOSE: 161 mg/dL — AB (ref 65–99)
Osmolality: 278 (ref 275–301)
POTASSIUM: 4.7 mmol/L (ref 3.5–5.1)
SODIUM: 138 mmol/L (ref 136–145)

## 2013-06-09 LAB — TROPONIN I

## 2013-12-02 ENCOUNTER — Ambulatory Visit: Payer: No Typology Code available for payment source | Attending: Internal Medicine

## 2013-12-02 ENCOUNTER — Ambulatory Visit: Payer: Self-pay | Attending: Internal Medicine

## 2013-12-03 ENCOUNTER — Other Ambulatory Visit: Payer: Self-pay | Admitting: Internal Medicine

## 2013-12-03 DIAGNOSIS — G8929 Other chronic pain: Secondary | ICD-10-CM

## 2013-12-03 DIAGNOSIS — M545 Low back pain: Principal | ICD-10-CM

## 2013-12-03 MED ORDER — PREGABALIN 100 MG PO CAPS: 100.0000 mg | ORAL_CAPSULE | Freq: Two times a day (BID) | ORAL | Status: DC

## 2013-12-19 ENCOUNTER — Inpatient Hospital Stay (HOSPITAL_COMMUNITY)
Admission: EM | Admit: 2013-12-19 | Discharge: 2013-12-21 | DRG: 309 | Disposition: A | Discharge status: Home / Self Care | Payer: Self-pay | Attending: Cardiovascular Disease | Admitting: Cardiovascular Disease

## 2013-12-19 ENCOUNTER — Encounter (HOSPITAL_COMMUNITY): Payer: Self-pay | Admitting: Emergency Medicine

## 2013-12-19 DIAGNOSIS — F1010 Alcohol abuse, uncomplicated: Secondary | ICD-10-CM

## 2013-12-19 DIAGNOSIS — F1020 Alcohol dependence, uncomplicated: Secondary | ICD-10-CM | POA: Diagnosis present

## 2013-12-19 DIAGNOSIS — I472 Ventricular tachycardia, unspecified: Secondary | ICD-10-CM

## 2013-12-19 DIAGNOSIS — F17210 Nicotine dependence, cigarettes, uncomplicated: Secondary | ICD-10-CM | POA: Diagnosis present

## 2013-12-19 DIAGNOSIS — F1120 Opioid dependence, uncomplicated: Secondary | ICD-10-CM | POA: Diagnosis present

## 2013-12-19 DIAGNOSIS — I10 Essential (primary) hypertension: Secondary | ICD-10-CM | POA: Diagnosis present

## 2013-12-19 DIAGNOSIS — E785 Hyperlipidemia, unspecified: Secondary | ICD-10-CM | POA: Diagnosis present

## 2013-12-19 DIAGNOSIS — F419 Anxiety disorder, unspecified: Secondary | ICD-10-CM | POA: Diagnosis present

## 2013-12-19 DIAGNOSIS — E876 Hypokalemia: Secondary | ICD-10-CM | POA: Diagnosis present

## 2013-12-19 DIAGNOSIS — I493 Ventricular premature depolarization: Secondary | ICD-10-CM | POA: Diagnosis present

## 2013-12-19 DIAGNOSIS — M549 Dorsalgia, unspecified: Secondary | ICD-10-CM | POA: Diagnosis present

## 2013-12-19 DIAGNOSIS — K219 Gastro-esophageal reflux disease without esophagitis: Secondary | ICD-10-CM | POA: Diagnosis present

## 2013-12-19 DIAGNOSIS — I420 Dilated cardiomyopathy: Secondary | ICD-10-CM | POA: Diagnosis present

## 2013-12-19 DIAGNOSIS — G894 Chronic pain syndrome: Secondary | ICD-10-CM | POA: Diagnosis present

## 2013-12-19 DIAGNOSIS — F99 Mental disorder, not otherwise specified: Secondary | ICD-10-CM | POA: Diagnosis present

## 2013-12-19 DIAGNOSIS — E780 Pure hypercholesterolemia: Secondary | ICD-10-CM | POA: Diagnosis present

## 2013-12-19 DIAGNOSIS — I4729 Other ventricular tachycardia: Secondary | ICD-10-CM

## 2013-12-19 DIAGNOSIS — Z888 Allergy status to other drugs, medicaments and biological substances status: Secondary | ICD-10-CM

## 2013-12-19 LAB — COMPREHENSIVE METABOLIC PANEL
ALBUMIN: 3.9 g/dL (ref 3.5–5.2)
ALT: 13 U/L (ref 0–53)
ANION GAP: 24 — AB (ref 5–15)
AST: 15 U/L (ref 0–37)
Alkaline Phosphatase: 104 U/L (ref 39–117)
BILIRUBIN TOTAL: 0.7 mg/dL (ref 0.3–1.2)
BUN: 9 mg/dL (ref 6–23)
CO2: 20 mEq/L (ref 19–32)
Calcium: 9.3 mg/dL (ref 8.4–10.5)
Chloride: 96 mEq/L (ref 96–112)
Creatinine, Ser: 0.65 mg/dL (ref 0.50–1.35)
GFR calc non Af Amer: >90 mL/min (ref >90)
Glucose, Bld: 138 mg/dL — ABNORMAL HIGH (ref 70–99)
Potassium: 3.3 mEq/L — ABNORMAL LOW (ref 3.7–5.3)
Sodium: 140 mEq/L (ref 137–147)
Total Protein: 6.9 g/dL (ref 6.0–8.3)

## 2013-12-19 LAB — CBC
HCT: 49.9 % (ref 39.0–52.0)
Hemoglobin: 17.9 g/dL — ABNORMAL HIGH (ref 13.0–17.0)
MCH: 27.4 pg (ref 26.0–34.0)
MCHC: 35.9 g/dL (ref 30.0–36.0)
MCV: 76.4 fL — AB (ref 78.0–100.0)
Platelets: 356 10*3/uL (ref 150–400)
RBC: 6.53 MIL/uL — ABNORMAL HIGH (ref 4.22–5.81)
RDW: 13.8 % (ref 11.5–15.5)
WBC: 16.8 10*3/uL — ABNORMAL HIGH (ref 4.0–10.5)

## 2013-12-19 LAB — TROPONIN I

## 2013-12-19 LAB — RAPID URINE DRUG SCREEN, HOSP PERFORMED
AMPHETAMINES: NOT DETECTED
Barbiturates: NOT DETECTED
Benzodiazepines: POSITIVE — AB
Cocaine: NOT DETECTED
Opiates: NOT DETECTED
Tetrahydrocannabinol: NOT DETECTED

## 2013-12-19 LAB — MAGNESIUM: Magnesium: 2.4 mg/dL (ref 1.5–2.5)

## 2013-12-19 LAB — ETHANOL: Alcohol, Ethyl (B): 180 mg/dL — ABNORMAL HIGH (ref 0–11)

## 2013-12-19 MED ORDER — ONDANSETRON HCL 4 MG/2ML IJ SOLN
4.0000 mg | Freq: Four times a day (QID) | INTRAMUSCULAR | Status: DC | PRN
  Administered 2013-12-20: 4 mg via INTRAVENOUS
  Filled 2013-12-19: qty 2

## 2013-12-19 MED ORDER — THIAMINE HCL 100 MG/ML IJ SOLN
100.0000 mg | Freq: Every day | INTRAMUSCULAR | Status: DC
  Filled 2013-12-19: qty 1

## 2013-12-19 MED ORDER — GABAPENTIN 300 MG PO CAPS
300.0000 mg | ORAL_CAPSULE | Freq: Three times a day (TID) | ORAL | Status: DC
  Administered 2013-12-20 – 2013-12-21 (×6): 300 mg via ORAL
  Filled 2013-12-19 (×7): qty 1

## 2013-12-19 MED ORDER — LORAZEPAM 1 MG PO TABS
0.0000 mg | ORAL_TABLET | Freq: Four times a day (QID) | ORAL | Status: AC
Start: 2013-12-19 — End: 2013-12-21
  Administered 2013-12-19 – 2013-12-20 (×2): 2 mg via ORAL
  Administered 2013-12-20: 1 mg via ORAL
  Administered 2013-12-20 (×2): 4 mg via ORAL
  Administered 2013-12-21: 1 mg via ORAL
  Administered 2013-12-21: 2 mg via ORAL
  Administered 2013-12-21: 1 mg via ORAL
  Filled 2013-12-19: qty 4
  Filled 2013-12-19 (×2): qty 2
  Filled 2013-12-19: qty 4
  Filled 2013-12-19: qty 1
  Filled 2013-12-19: qty 2
  Filled 2013-12-19: qty 1

## 2013-12-19 MED ORDER — LORAZEPAM 1 MG PO TABS
0.0000 mg | ORAL_TABLET | Freq: Two times a day (BID) | ORAL | Status: DC
  Filled 2013-12-19: qty 2

## 2013-12-19 MED ORDER — ALBUTEROL SULFATE HFA 108 (90 BASE) MCG/ACT IN AERS: 1.0000 | INHALATION_SPRAY | Freq: Four times a day (QID) | RESPIRATORY_TRACT | Status: DC | PRN

## 2013-12-19 MED ORDER — SODIUM CHLORIDE 0.9 % IJ SOLN
3.0000 mL | Freq: Two times a day (BID) | INTRAMUSCULAR | Status: DC
  Administered 2013-12-20 – 2013-12-21 (×4): 3 mL via INTRAVENOUS

## 2013-12-19 MED ORDER — METOPROLOL TARTRATE 50 MG PO TABS
50.0000 mg | ORAL_TABLET | Freq: Two times a day (BID) | ORAL | Status: DC
  Administered 2013-12-20 – 2013-12-21 (×4): 50 mg via ORAL
  Filled 2013-12-19: qty 2
  Filled 2013-12-19 (×3): qty 1
  Filled 2013-12-19: qty 2

## 2013-12-19 MED ORDER — OXYCODONE HCL 5 MG PO TABS
5.0000 mg | ORAL_TABLET | ORAL | Status: DC | PRN
  Administered 2013-12-19 – 2013-12-21 (×11): 5 mg via ORAL
  Filled 2013-12-19 (×12): qty 1

## 2013-12-19 MED ORDER — ALBUTEROL SULFATE (2.5 MG/3ML) 0.083% IN NEBU: 2.5000 mg | INHALATION_SOLUTION | Freq: Four times a day (QID) | RESPIRATORY_TRACT | Status: DC | PRN

## 2013-12-19 MED ORDER — AMIODARONE IV BOLUS ONLY 150 MG/100ML
150.0000 mg | Freq: Once | INTRAVENOUS | Status: AC
  Administered 2013-12-19: 150 mg via INTRAVENOUS
  Filled 2013-12-19: qty 100

## 2013-12-19 MED ORDER — POTASSIUM CHLORIDE ER 10 MEQ PO TBCR
20.0000 meq | EXTENDED_RELEASE_TABLET | Freq: Three times a day (TID) | ORAL | Status: DC
Start: 2013-12-19 — End: 2013-12-21
  Administered 2013-12-19 – 2013-12-21 (×7): 20 meq via ORAL
  Filled 2013-12-19 (×12): qty 2

## 2013-12-19 MED ORDER — HEPARIN SODIUM (PORCINE) 5000 UNIT/ML IJ SOLN
5000.0000 [IU] | Freq: Three times a day (TID) | INTRAMUSCULAR | Status: DC
  Administered 2013-12-20 – 2013-12-21 (×6): 5000 [IU] via SUBCUTANEOUS
  Filled 2013-12-19 (×8): qty 1

## 2013-12-19 MED ORDER — DOCUSATE SODIUM 100 MG PO CAPS
100.0000 mg | ORAL_CAPSULE | Freq: Two times a day (BID) | ORAL | Status: DC
  Administered 2013-12-20 – 2013-12-21 (×3): 100 mg via ORAL
  Filled 2013-12-19 (×5): qty 1

## 2013-12-19 MED ORDER — ONDANSETRON HCL 4 MG PO TABS
4.0000 mg | ORAL_TABLET | Freq: Four times a day (QID) | ORAL | Status: DC | PRN
  Administered 2013-12-20 – 2013-12-21 (×2): 4 mg via ORAL
  Filled 2013-12-19 (×2): qty 1

## 2013-12-19 MED ORDER — ALUM & MAG HYDROXIDE-SIMETH 200-200-20 MG/5ML PO SUSP: 30.0000 mL | Freq: Four times a day (QID) | ORAL | Status: DC | PRN

## 2013-12-19 MED ORDER — MAGNESIUM SULFATE 40 MG/ML IJ SOLN
2.0000 g | INTRAMUSCULAR | Status: AC
  Administered 2013-12-19: 2 g via INTRAVENOUS
  Filled 2013-12-19: qty 50

## 2013-12-19 MED ORDER — METOPROLOL TARTRATE 1 MG/ML IV SOLN
5.0000 mg | Freq: Once | INTRAVENOUS | Status: AC
  Administered 2013-12-19: 5 mg via INTRAVENOUS
  Filled 2013-12-19: qty 5

## 2013-12-19 MED ORDER — VITAMIN B-1 100 MG PO TABS
100.0000 mg | ORAL_TABLET | Freq: Every day | ORAL | Status: DC
  Administered 2013-12-19 – 2013-12-21 (×3): 100 mg via ORAL
  Filled 2013-12-19 (×3): qty 1

## 2013-12-19 NOTE — ED Notes (Addendum)
EMS reports pt has chronic back pain, pt has taken extra pain meds and ran out, in order to help pain he purchased 1/2 gallon of ETOH, Pt sitting under gazebo in chair, agitated and angry, states he is in a  Lot of pain, 5mg  versed given left deltoid, (Pt is an alcoholic) Pt was taking Oxycodone and ran out 3-4 days ago. Pt calmed after injection. # 18 IV left arm. EKG with multiple PVC's and LBH noted. BP 137/89-90- 16 SPO 2 93% RA

## 2013-12-19 NOTE — ED Provider Notes (Signed)
CSN: [id number]     Arrival date & time 12/19/13  1603 History   First MD Initiated Contact with Patient 12/19/13 1614     Chief Complaint  Patient presents with  . Alcohol Intoxication     (Consider location/radiation/quality/duration/timing/severity/associated sxs/prior Treatment) HPI Comments: 48 year old male, history of alcohol abuse and alcoholism, history of drug abuse including opiate use, overuse and dependence, multiple visits to the emergency department for alcohol withdrawal and withdrawal seizures. He states that he had been on methadone for many years, he came off of this several years ago and was without medication for pain (chronic back pain after fusion) for over one year. He then started taking 15 mg oxycodone said over the last several months has been using high doses of oxycodone daily. He ran out 2 days ago and since that time has been drinking heavily, vodka, half a gallon over the last 48 hours, his wife called the paramedics for transport for evaluation. When asked if the patient feels like he was doing this to hurt himself or die he says "no, but it has not happened so be it".  The paramedics had to give the patient 5 mg of Versed due to his extreme agitation on arrival, he was hitting the ground, he was swinging at the air, flexing his muscles and being combative  Patient is a 48 y.o. male presenting with intoxication. The history is provided by the patient.  Alcohol Intoxication    Past Medical History  Diagnosis Date  . Hypertension   . Hyperlipidemia   . 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 ETOH abuse   . GERD (gastroesophageal reflux disease)   . Mental disorder   . Irregular heart beat   . Anxiety   . Shortness of breath   . Headache(784.0)   . Hepatitis   . Withdrawal seizures   . Alcohol withdrawal   . Neuropathy 06/07/2013   Past Surgical History  Procedure Laterality Date  . Back surgery    . Neck  surgery    . Fundoplasty transthoracic  2003  . Nasal sinus surgery     Family History  Problem Relation Age of Onset  . Breast cancer Paternal Grandmother   . Lung cancer      was a smoker  . Hypertension Mother    History  Substance Use Topics  . Smoking status: Current Every Day Smoker -- 0.50 packs/day for 30 years    Types: Cigarettes  . Smokeless tobacco: Never Used  . Alcohol Use: 4.2 oz/week    7 Shots of liquor per week     Comment: 1/2 gal ETOH    Review of Systems  All other systems reviewed and are negative.     Allergies  Lisinopril  Home Medications   Prior to Admission medications   Medication Sig Start Date End Date Taking? Authorizing Provider  albuterol (PROVENTIL HFA;VENTOLIN HFA) 108 (90 BASE) MCG/ACT inhaler Inhale 2 puffs into the lungs every 4 (four) hours as needed for wheezing or shortness of breath.   Yes Historical Provider, MD  metoprolol (LOPRESSOR) 50 MG tablet Take 50 mg by mouth 2 (two) times daily.   Yes Historical Provider, MD  oxyCODONE (ROXICODONE) 15 MG immediate release tablet Take 15 mg by mouth every 4 (four) hours as needed for pain.   Yes Historical Provider, MD  pregabalin (LYRICA) 100 MG capsule Take 1 capsule (100 mg total) by mouth 2 (two)  times daily. 12/03/13   Olugbemiga E Doreene Burke, MD   BP 151/71  Pulse 63  Temp(Src) 98.8 F (37.1 C) (Oral)  Resp 27  SpO2 94% Physical Exam  Nursing note and vitals reviewed. Constitutional: He appears well-developed and well-nourished.  HENT:  Head: Normocephalic and atraumatic.  Mouth/Throat: No oropharyngeal exudate.  Mucous membranes slightly dehydrated  Eyes: Conjunctivae and EOM are normal. Pupils are equal, round, and reactive to light. Right eye exhibits no discharge. Left eye exhibits no discharge. No scleral icterus.  Neck: Normal range of motion. Neck supple. No JVD present. No thyromegaly present.  Cardiovascular: Regular rhythm, normal heart sounds and intact distal  pulses.  Exam reveals no gallop and no friction rub.   No murmur heard. Tachycardic to 110 beats per minute with occasional ectopy  Pulmonary/Chest: Effort normal and breath sounds normal. No respiratory distress. He has no wheezes. He has no rales.  Abdominal: Soft. Bowel sounds are normal. He exhibits no distension and no mass. There is no tenderness.  Musculoskeletal: Normal range of motion. He exhibits no edema and no tenderness.  Lymphadenopathy:    He has no cervical adenopathy.  Neurological: He is alert. Coordination normal.  Skin: Skin is warm and dry. No rash noted. No erythema.  Psychiatric: He has a normal mood and affect. His behavior is normal.    ED Course  Procedures (including critical care time) Labs Review Labs Reviewed  ETHANOL - Abnormal; Notable for the following:    Alcohol, Ethyl (B) 180 (*)    All other components within normal limits  CBC - Abnormal; Notable for the following:    WBC 16.8 (*)    RBC 6.53 (*)    Hemoglobin 17.9 (*)    MCV 76.4 (*)    All other components within normal limits  COMPREHENSIVE METABOLIC PANEL - Abnormal; Notable for the following:    Potassium 3.3 (*)    Glucose, Bld 138 (*)    Anion gap 24 (*)    All other components within normal limits  URINE RAPID DRUG SCREEN (HOSP PERFORMED) - Abnormal; Notable for the following:    Benzodiazepines POSITIVE (*)    All other components within normal limits  MAGNESIUM  TROPONIN I  TROPONIN I  TROPONIN I    Imaging Review No results found.   EKG Interpretation   Date/Time:  Sunday December 19 2013 16:33:36 EDT Ventricular Rate:  112 PR Interval:  146 QRS Duration: 108 QT Interval:  484 QTC Calculation: 661 R Axis:   47 Text Interpretation:  Sinus tachycardia Ventricular tachycardia,  unsustained Probable left atrial enlargement Prolonged QT interval Since  last tracing rate faster, QT very prolonged and QRS widening present  Abnormal ekg Confirmed by Sabra Heck  MD, Dashanna Kinnamon  [phone]) on 12/19/2013 4:45:19  PM      MDM   Final diagnoses:  None    At this time the patient is tachycardic with occasional PVCs but has no other signs of acute withdrawal in fact he appears extremely intoxicated, he is somnolent, occasional words but overall is able to do everything I ask him to. He appears very depressed and withdrawn, he will need to sober and he will need CIWA protocol, TTS consultation. We'll obtain EKG to evaluate for QT prolongation in the presence of this arrhythmia.  The pt has had frequent episodes of 2 beat VT, with one run of 4+ run of VT, terminated spontaneously.  He has seen cardiology in the past and has had dilated cardiomyopathy  on echo with low EF but has also improved EF last year.  Will place pads, give magnesium and consult with cardiology.  At 5:45 PM, the patient had a very long run of ventricular tachycardia lasting approximately 10 seconds, multiple beats, has continual runs of 4-5 beats frequently, laboratory workup shows only mild hypokalemia, normal magnesium level, significant anion gap of 24.  Dr. Doylene Canard is on his way to see pt from [facility].  Pt denies any other intake / OD / SA and denies toxic alcohols.  Pt now getting amiodarone - Dr. Doylene Canard is admitting to the cardiology service.  CRITICAL CARE Performed by: Johnna Acosta Total critical care time: 35 Critical care time was exclusive of separately billable procedures and treating other patients. Critical care was necessary to treat or prevent imminent or life-threatening deterioration. Critical care was time spent personally by me on the following activities: development of treatment plan with patient and/or surrogate as well as nursing, discussions with consultants, evaluation of patient's response to treatment, examination of patient, obtaining history from patient or surrogate, ordering and performing treatments and interventions, ordering and review of laboratory studies,  ordering and review of radiographic studies, pulse oximetry and re-evaluation of patient's condition.   Meds given in ED:  Medications  LORazepam (ATIVAN) tablet 0-4 mg (2 mg Oral Given 12/19/13 1654)    Followed by  LORazepam (ATIVAN) tablet 0-4 mg (not administered)  thiamine (VITAMIN B-1) tablet 100 mg (100 mg Oral Given 12/19/13 1655)    Or  thiamine (B-1) injection 100 mg ( Intravenous See Alternative 12/19/13 1655)  potassium chloride (K-DUR) CR tablet 20 mEq (20 mEq Oral Given 12/19/13 1824)  metoprolol (LOPRESSOR) injection 5 mg (not administered)  albuterol (PROVENTIL HFA;VENTOLIN HFA) 108 (90 BASE) MCG/ACT inhaler 1-2 puff (not administered)  gabapentin (NEURONTIN) capsule 300 mg (not administered)  metoprolol tartrate (LOPRESSOR) tablet 50 mg (not administered)  magnesium sulfate IVPB 2 g 50 mL (0 g Intravenous Stopped 12/19/13 1821)  amiodarone (NEXTERONE) IV bolus only 150 mg/100 mL (150 mg Intravenous New Bag/Given 12/19/13 1816)    New Prescriptions   No medications on file        Johnna Acosta, MD 12/19/13 [phone]

## 2013-12-19 NOTE — BH Assessment (Signed)
Assessment Note  Mark [name] is an 48 y.o. male. PT comes to [facility] due to chronic pain issues.  Pt reports he is prescribed up to 6 15mg  oxycodone per day but often uses 8 pills per day.  Pt ran out of medication last Wed.  Pt used alcohol for pain control Friday and came to [facility] today.  Pt reports his pain MD is Dr Maceo Pro, who he is supposed to see this week.  Pt reports chronic pain is his only stressor, although he has history of alcohol abuse.  Pt wanted to talk about detox but does not have any other pain control plan in place.  Pt reports depression but denies SI/HI/AV.  Pt has one previous [facility] admit fall 2014 for detox.    Axis I: Substance Abuse Axis II: Deferred Axis III:  Past Medical History  Diagnosis Date  . Hypertension   . Hyperlipidemia   . 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 ETOH abuse   . GERD (gastroesophageal reflux disease)   . Mental disorder   . Irregular heart beat   . Anxiety   . Shortness of breath   . Headache(784.0)   . Hepatitis   . Withdrawal seizures   . Alcohol withdrawal   . Neuropathy 06/07/2013   Axis IV: pain control issues Axis V: 41-50 serious symptoms  Past Medical History:  Past Medical History  Diagnosis Date  . Hypertension   . Hyperlipidemia   . 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 ETOH abuse   . GERD (gastroesophageal reflux disease)   . Mental disorder   . Irregular heart beat   . Anxiety   . Shortness of breath   . Headache(784.0)   . Hepatitis   . Withdrawal seizures   . Alcohol withdrawal   . Neuropathy 06/07/2013    Past Surgical History  Procedure Laterality Date  . Back surgery    . Neck surgery    . Fundoplasty transthoracic  2003  . Nasal sinus surgery      Family History:  Family History  Problem Relation Age of Onset  . Breast cancer Paternal Grandmother   . Lung cancer      was a smoker  . Hypertension Mother      Social History:  reports that he has been smoking Cigarettes.  He has a 15 pack-year smoking history. He has never used smokeless tobacco. He reports that he drinks about 4.2 ounces of alcohol per week. He reports that he uses illicit drugs.  Additional Social History:  Alcohol / Drug Use Pain Medications: yes Prescriptions: yes History of alcohol / drug use?: Yes Negative Consequences of Use: Personal relationships Withdrawal Symptoms: Agitation;Nausea / Vomiting;Diarrhea;Sweats;Tremors Substance #1 Name of Substance 1: oxycodone 1 - Amount (size/oz): 6-8 15mg  tabs per day 1 - Frequency: daily 1 - Last Use / Amount: 10/7  Substance #2 Name of Substance 2: alcohol 2 - Amount (size/oz): Pt denies regular use.  States he drank 1/2 gallon vodka on 10/9 due to pain 2 - Last Use / Amount: 10/9  CIWA: CIWA-Ar BP: 145/103 mmHg Pulse Rate: 104 Nausea and Vomiting: 5 Tactile Disturbances: none Tremor: not visible, but can be felt fingertip to fingertip Auditory Disturbances: not present Paroxysmal Sweats: no sweat visible Visual Disturbances: not present Anxiety: moderately anxious, or guarded, so anxiety is inferred Headache, Fullness in Head: severe Agitation:  moderately fidgety and restless Orientation and Clouding of Sensorium: oriented and can do serial additions CIWA-Ar Total: 19 COWS: Clinical Opiate Withdrawal Scale (COWS) Resting Pulse Rate: Pulse Rate greater than 120 Sweating: Flushed or Observable moistness on face Restlessness: Frequent shifting or extraneous movements of legs/arms Pupil Size: Pupils pinned or normal size for room light Bone or Joint Aches: Patient reports sever diffuse aching of joints/muscles Runny Nose or Tearing: Not present GI Upset: Vomiting or diarrhea Tremor: Slight tremor observable Yawning: No yawning Anxiety or Irritability: Patient obviously irritable/anxious Gooseflesh Skin: Skin is smooth COWS Total Score: 18  Allergies:   Allergies  Allergen Reactions  . Lisinopril Anaphylaxis and Swelling    Home Medications:  (Not in a hospital admission)  OB/GYN Status:  No LMP for male patient.  General Assessment Data Location of Assessment: [facility] ED ACT Assessment: Yes Is this a Tele or Face-to-Face Assessment?: Face-to-Face Is this an Initial Assessment or a Re-assessment for this encounter?: Initial Assessment Living Arrangements: Parent Can pt return to current living arrangement?: Yes     Mark [name] Living Arrangements: Parent Name of Psychiatrist: none Name of Therapist: none     Risk to self with the past 6 months Suicidal Ideation: No Suicidal Intent: No Is patient at risk for suicide?: No Suicidal Plan?: No Access to Means: No What has been your use of drugs/alcohol within the last 12 months?: pt overusing pain medication Previous Attempts/Gestures: No Intentional Self Injurious Behavior: None Family Suicide History: No Recent stressful life event(s): Other (Comment) (chronic pain issues) Persecutory voices/beliefs?: No Depression: Yes Depression Symptoms: Insomnia;Guilt Substance abuse history and/or treatment for substance abuse?: Yes Suicide prevention information given to non-admitted patients: Not applicable  Risk to Others within the past 6 months Homicidal Ideation: No Thoughts of Harm to Others: No Current Homicidal Intent: No Current Homicidal Plan: No Access to Homicidal Means: No History of harm to others?: No Assessment of Violence: None Noted Does patient have access to weapons?: No Criminal Charges Pending?: No Does patient have a court date: No  Psychosis Hallucinations: None noted Delusions: None noted  Mental Status Report Appear/Hygiene: In hospital gown Eye Contact: Good Motor Activity: Unremarkable Speech: Logical/coherent Level of Consciousness: Alert Mood: Anxious Affect: Appropriate to circumstance Anxiety Level: Moderate Thought  Processes: Coherent;Relevant Judgement: Unimpaired Orientation: Person;Place;Time;Situation Obsessive Compulsive Thoughts/Behaviors: None  Cognitive Functioning Concentration: Decreased Memory: Recent Intact;Remote Intact IQ: Average Insight: Good Impulse Control: Poor Appetite: Good Weight Loss: 10 Weight Gain: 0 Sleep: Decreased Total Hours of Sleep: 3 Vegetative Symptoms: None  ADLScreening [facility] Assessment Services) Patient's cognitive ability adequate to safely complete daily activities?: Yes Patient able to express need for assistance with ADLs?: Yes Independently performs ADLs?: Yes (appropriate for developmental age)  Prior Inpatient Therapy Prior Inpatient Therapy: Yes Prior Therapy Dates: fall 2014 Prior Therapy Facilty/Provider(s): [facility] Reason for Treatment: detox  Prior Outpatient Therapy Prior Outpatient Therapy: No  ADL Screening (condition at time of admission) Patient's cognitive ability adequate to safely complete daily activities?: Yes Patient able to express need for assistance with ADLs?: Yes Independently performs ADLs?: Yes (appropriate for developmental age)       Abuse/Neglect Assessment (Assessment to be complete while patient is alone) Physical Abuse: Denies Verbal Abuse: Denies Sexual Abuse: Denies Exploitation of patient/patient's resources: Denies Self-Neglect: Denies Values / Beliefs Cultural Requests During Hospitalization: None Spiritual Requests During Hospitalization: None   Advance Directives (For Healthcare) Does patient have an advance directive?: Yes Would patient like information on creating an advanced  directive?: No - patient declined information Type of Advance Directive: Healthcare Power of Attorney Copy of advanced directive(s) in chart?: No - copy requested    Additional Information 1:1 In Past 12 Months?: No CIRT Risk: No Elopement Risk: No Does patient have medical clearance?: No     Disposition: Discussed  this pt with Dr Sabra Heck of [facility].  Pt was medically clear but now has begun to show an irregular EKG and will require a medical admission. Disposition Initial Assessment Completed for this Encounter: Yes Disposition of Patient:  (medical admit)  On Site Evaluation by:   Reviewed with Physician:    Joanne Chars 12/19/2013 6:09 PM

## 2013-12-19 NOTE — ED Notes (Signed)
Patient continues to have frequent episodes of 4-5 bets of v-tach. Pads placed on patient. Patient constantly begging for pain medication.

## 2013-12-19 NOTE — ED Notes (Signed)
Bed: RESA Expected date:  Expected time:  Means of arrival:  Comments: ETOH

## 2013-12-19 NOTE — H&P (Signed)
Referring Physician:  [name] is an 48 y.o. male.                       Chief Complaint: Recurrent VT   HPI: 48 year old white male with history of chronic back pain and oxycodone use ran out of medications and drank some alcohol. EKG showed frequent VPCs and short run of VT. Blood work showed hypokalemia with K + level of 3.3, elevated alcohol level. Past echocardiogram showed EF of to 60 % on 06/09/2012 and 25 % on Nuclear SPECT images on 01/21/2012.  Past Medical History  Diagnosis Date  . Hypertension   . Hyperlipidemia   . 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 ETOH abuse   . GERD (gastroesophageal reflux disease)   . Mental disorder   . Irregular heart beat   . Anxiety   . Shortness of breath   . Headache(784.0)   . Hepatitis   . Withdrawal seizures   . Alcohol withdrawal   . Neuropathy 06/07/2013      Past Surgical History  Procedure Laterality Date  . Back surgery    . Neck surgery    . Fundoplasty transthoracic  2003  . Nasal sinus surgery      Family History  Problem Relation Age of Onset  . Breast cancer Paternal Grandmother   . Lung cancer      was a smoker  . Hypertension Mother    Social History:  reports that he has been smoking Cigarettes.  He has a 15 pack-year smoking history. He has never used smokeless tobacco. He reports that he drinks about 4.2 ounces of alcohol per week. He reports that he uses illicit drugs.  Allergies:  Allergies  Allergen Reactions  . Lisinopril Anaphylaxis and Swelling     (Not in a hospital admission)  Results for orders placed during the hospital encounter of 12/19/13 (from the past 48 hour(s))  Mark [name]     Status: Abnormal   Collection Time    12/19/13  4:39 PM      Result Value Ref Range   Alcohol, Ethyl (B) 180 (*) 0 - 11 mg/dL   Comment:            LOWEST DETECTABLE LIMIT FOR     SERUM ALCOHOL IS 11 mg/dL     FOR MEDICAL PURPOSES ONLY  CBC     Status: Abnormal    Collection Time    12/19/13  4:39 PM      Result Value Ref Range   WBC 16.8 (*) 4.0 - 10.5 K/uL   RBC 6.53 (*) 4.22 - 5.81 MIL/uL   Hemoglobin 17.9 (*) 13.0 - 17.0 g/dL   HCT 49.9  39.0 - 52.0 %   MCV 76.4 (*) 78.0 - 100.0 fL   MCH 27.4  26.0 - 34.0 pg   MCHC 35.9  30.0 - 36.0 g/dL   RDW 13.8  11.5 - 15.5 %   Platelets 356  150 - 400 K/uL  COMPREHENSIVE METABOLIC PANEL     Status: Abnormal   Collection Time    12/19/13  4:39 PM      Result Value Ref Range   Sodium 140  137 - 147 mEq/L   Potassium 3.3 (*) 3.7 - 5.3 mEq/L   Chloride 96  96 - 112 mEq/L   CO2 20  19 - 32 mEq/L   Glucose, Bld 138 (*)  70 - 99 mg/dL   BUN 9  6 - 23 mg/dL   Creatinine, Ser 0.65  0.50 - 1.35 mg/dL   Calcium 9.3  8.4 - 10.5 mg/dL   Total Protein 6.9  6.0 - 8.3 g/dL   Albumin 3.9  3.5 - 5.2 g/dL   AST 15  0 - 37 U/L   ALT 13  0 - 53 U/L   Alkaline Phosphatase 104  39 - 117 U/L   Total Bilirubin 0.7  0.3 - 1.2 mg/dL   GFR calc non Af Amer >90  >90 mL/min   GFR calc Af Amer >90  >90 mL/min   Comment: (NOTE)     The eGFR has been calculated using the CKD EPI equation.     This calculation has not been validated in all clinical situations.     eGFR's persistently <90 mL/min signify possible Chronic Kidney     Disease.   Anion gap 24 (*) 5 - 15   Comment: RESULT REPEATED AND VERIFIED  MAGNESIUM     Status: None   Collection Time    12/19/13  4:39 PM      Result Value Ref Range   Magnesium 2.4  1.5 - 2.5 mg/dL   No results found.  Review Of Systems Constitutional: Negative for fever and chills.  Respiratory: Positive for shortness of breath. Negative for cough, hemoptysis and sputum production.  Cardiovascular: Positive for chest pain, palpitations and leg swelling. Negative for orthopnea and claudication.  Gastrointestinal: Positive for abdominal pain. Negative for heartburn, nausea and vomiting.  Genitourinary: Negative for dysuria.  Neurological: Positive for dizziness.    Blood pressure  145/103, pulse 104, temperature 98.8 F (37.1 C), temperature source Oral, resp. rate 17, SpO2 93.00%.  Physical Exam  Constitutional: He appears well-developed and well-nourished.  HENT: Head: Normocephalic and atraumatic. Nose: Nose normal. Mouth/Throat: No oropharyngeal exudate.  Eyes: Conjunctivae normal. No scleral icterus.  Neck: Normal range of motion. Neck supple. No JVD present. No tracheal deviation present. No thyromegaly present.  Cardiovascular: Irregular rate with frequent nonsustained VT and bigeminy and trigeminy.  S1 and S2 normal. soft systolic murmur noted.  Respiratory: Effort normal and breath sounds normal. No respiratory distress.   GI: Soft. Bowel sounds are normal. He exhibits no distension.   Musculoskeletal: No clubbing or cyanosis. Trace edema noted  Neurological: He is alert and oriented to person, place, and time. Somewhat anxious.  Assessment/Plan Recurrent nonsustained VT rule out ischemia  Hypokalemia  Hypertension  GERD  Alcohol abuse  Tobacco abuse  Hypercholesteremia  Replace Potassium and magenesium. Use Metoprolol and amiodaraone for VT. Monitor for DTs.   Mark Riddle, MD  12/19/2013, 6:18 PM

## 2013-12-20 ENCOUNTER — Encounter (HOSPITAL_COMMUNITY): Payer: Self-pay | Admitting: *Deleted

## 2013-12-20 LAB — BASIC METABOLIC PANEL
Anion gap: 16 — ABNORMAL HIGH (ref 5–15)
BUN: 9 mg/dL (ref 6–23)
CHLORIDE: 94 meq/L — AB (ref 96–112)
CO2: 25 mEq/L (ref 19–32)
Calcium: 9 mg/dL (ref 8.4–10.5)
Creatinine, Ser: 0.71 mg/dL (ref 0.50–1.35)
GFR calc Af Amer: >90 mL/min (ref >90)
Glucose, Bld: 136 mg/dL — ABNORMAL HIGH (ref 70–99)
Potassium: 3.5 mEq/L — ABNORMAL LOW (ref 3.7–5.3)
Sodium: 135 mEq/L — ABNORMAL LOW (ref 137–147)

## 2013-12-20 LAB — CBC
HCT: 46.6 % (ref 39.0–52.0)
HEMOGLOBIN: 16.1 g/dL (ref 13.0–17.0)
MCH: 27.2 pg (ref 26.0–34.0)
MCHC: 34.5 g/dL (ref 30.0–36.0)
MCV: 78.6 fL (ref 78.0–100.0)
Platelets: 320 10*3/uL (ref 150–400)
RBC: 5.93 MIL/uL — ABNORMAL HIGH (ref 4.22–5.81)
RDW: 13.8 % (ref 11.5–15.5)
WBC: 14.1 10*3/uL — ABNORMAL HIGH (ref 4.0–10.5)

## 2013-12-20 LAB — MRSA PCR SCREENING: MRSA by PCR: INVALID — AB

## 2013-12-20 LAB — TROPONIN I: Troponin I: <0.3 ng/mL (ref <0.30)

## 2013-12-20 MED ORDER — LOSARTAN POTASSIUM 50 MG PO TABS
100.0000 mg | ORAL_TABLET | Freq: Every day | ORAL | Status: DC
  Administered 2013-12-20 – 2013-12-21 (×2): 100 mg via ORAL
  Filled 2013-12-20 (×2): qty 2

## 2013-12-20 MED ORDER — ACETAMINOPHEN 325 MG PO TABS
650.0000 mg | ORAL_TABLET | ORAL | Status: DC | PRN
  Administered 2013-12-20: 650 mg via ORAL
  Filled 2013-12-20: qty 2

## 2013-12-20 MED ORDER — NICOTINE 14 MG/24HR TD PT24
14.0000 mg | MEDICATED_PATCH | Freq: Every day | TRANSDERMAL | Status: DC
  Administered 2013-12-20 – 2013-12-21 (×2): 14 mg via TRANSDERMAL
  Filled 2013-12-20 (×2): qty 1

## 2013-12-20 NOTE — Progress Notes (Signed)
Subjective:  Patient denies any chest pain or shortness of breath. Denies palpitation lightheadedness or syncope. No further episodes of V. tach. On the monitor. Continues to have generalized pain .  Objective:  Vital Signs in the last 24 hours: Temp:  [97.8 F (36.6 C)-98.2 F (36.8 C)] 97.8 F (36.6 C) (10/12 1600) Pulse Rate:  [63-124] 81 (10/12 1445) Resp:  [12-38] 38 (10/12 1600) BP: (108-174)/(66-113) 108/74 mmHg (10/12 1600) SpO2:  [88 %-98 %] 94 % (10/12 1600) Weight:  [96.2 kg (212 lb 1.3 oz)-97.1 kg (214 lb 1.1 oz)] 96.2 kg (212 lb 1.3 oz) (10/12 0500)  Intake/Output from previous day: 10/11 0701 - 10/12 0700 In: 1030 [P.O.:880; I.V.:100; IV Piggyback:50] Out: 6712 [Urine:1750] Intake/Output from this shift: Total I/O In: 960 [P.O.:960] Out: -   Physical Exam: Neck: no adenopathy, no carotid bruit, no JVD and supple, symmetrical, trachea midline Lungs: clear to auscultation bilaterally Heart: regular rate and rhythm, S1, S2 normal and Soft systolic murmur noted no S3 gallop Abdomen: soft, non-tender; bowel sounds normal; no masses,  no organomegaly Extremities: extremities normal, atraumatic, no cyanosis or edema  Lab Results:  Recent Labs  12/19/13 1639 12/20/13 0008  WBC 16.8* 14.1*  HGB 17.9* 16.1  PLT 356 320    Recent Labs  12/19/13 1639 12/20/13 0008  NA 140 135*  K 3.3* 3.5*  CL 96 94*  CO2 20 25  GLUCOSE 138* 136*  BUN 9 9  CREATININE 0.65 0.71    Recent Labs  12/20/13 0008 12/20/13 0630  TROPONINI <0.30 <0.30   Hepatic Function Panel  Recent Labs  12/19/13 1639  PROT 6.9  ALBUMIN 3.9  AST 15  ALT 13  ALKPHOS 104  BILITOT 0.7   No results found for this basename: CHOL,  in the last 72 hours No results found for this basename: PROTIME,  in the last 72 hours  Imaging: Imaging results have been reviewed and No results found.  Cardiac Studies:  Assessment/Plan:  Status post Recurrent nonsustained VT in the setting of  hypokalemia Hypokalemia  Hypertension  GERD  Alcohol abuse  Tobacco abuse  Hypercholesteremia Chronic pain syndrome Plan Transfer to telemetry Check labs in a.m.   LOS: 1 day    Anneta Rounds N 12/20/2013, 6:05 PM

## 2013-12-20 NOTE — Plan of Care (Signed)
Problem: Phase I Progression Outcomes Goal: Pain controlled with appropriate interventions Outcome: Not Progressing Chronic pain issues remain. Goal: OOB as tolerated unless otherwise ordered Outcome: Progressing OOB ambulating in hall with assistance. Goal: Hemodynamically stable Outcome: Progressing Hypertension improving once losartan 100mg /day initiated.

## 2013-12-20 NOTE — Care Management Note (Signed)
  Page 1 of 1   12/20/2013     11:40:50 AM CARE MANAGEMENT NOTE 12/20/2013  Patient:  Mark [name], Mark [name]   Account Number:  [id number]  Date Initiated:  12/20/2013  Documentation initiated by:  Trinitie Mcgirr  Subjective/Objective Assessment:   etoh abuse and resp insufficiency     Action/Plan:   bipap -vent [id number]   Anticipated DC Date:  12/23/2013   Anticipated DC Plan:  HOME/SELF CARE  In-house referral  Clinical Social Worker      DC Planning Services  CM consult      Choice offered to / List presented to:             Status of service:  In process, will continue to follow Medicare Important Message given?   (If response is "NO", the following Medicare IM given date fields will be blank) Date Medicare IM given:   Medicare IM given by:   Date Additional Medicare IM given:   Additional Medicare IM given by:    Discharge Disposition:    Per UR Regulation:  Reviewed for med. necessity/level of care/duration of stay  If discussed at [place] of Stay Meetings, dates discussed:    Comments:  [id number]/Alla Sloma Rosana Hoes, RN, BSN, CCM Chart reviewed. Discharge needs and patient's stay to be reviewed and followed by case manager. Patient intubated [id number] am.

## 2013-12-20 NOTE — Progress Notes (Signed)
Echocardiogram 2D Echocardiogram has been performed.  Mark [name] 12/20/2013, 12:00 PM

## 2013-12-20 NOTE — Progress Notes (Signed)
Dr. Doylene Canard called re pt ; he stated that Dr. Terrence Dupont is caring for pt.  Dr. Terrence Dupont called and informed that pt c/o generalized pain and states that current pain medicine is not enough. Also updated on pt's elevated BP. Orders received and initiated. Vada Swift, Beverly Gust, RN

## 2013-12-21 ENCOUNTER — Other Ambulatory Visit: Payer: Self-pay

## 2013-12-21 LAB — CBC
HCT: 44.3 % (ref 39.0–52.0)
Hemoglobin: 15.3 g/dL (ref 13.0–17.0)
MCH: 27.2 pg (ref 26.0–34.0)
MCHC: 34.5 g/dL (ref 30.0–36.0)
MCV: 78.7 fL (ref 78.0–100.0)
PLATELETS: 277 10*3/uL (ref 150–400)
RBC: 5.63 MIL/uL (ref 4.22–5.81)
RDW: 13.6 % (ref 11.5–15.5)
WBC: 8.4 10*3/uL (ref 4.0–10.5)

## 2013-12-21 LAB — MAGNESIUM: MAGNESIUM: 2.2 mg/dL (ref 1.5–2.5)

## 2013-12-21 LAB — BASIC METABOLIC PANEL
ANION GAP: 14 (ref 5–15)
BUN: 9 mg/dL (ref 6–23)
CALCIUM: 9.1 mg/dL (ref 8.4–10.5)
CO2: 26 mEq/L (ref 19–32)
CREATININE: 0.88 mg/dL (ref 0.50–1.35)
Chloride: 99 mEq/L (ref 96–112)
GFR calc non Af Amer: >90 mL/min (ref >90)
Glucose, Bld: 99 mg/dL (ref 70–99)
Potassium: 4 mEq/L (ref 3.7–5.3)
Sodium: 139 mEq/L (ref 137–147)

## 2013-12-21 MED ORDER — NICOTINE 14 MG/24HR TD PT24: 14.0000 mg | MEDICATED_PATCH | Freq: Every day | TRANSDERMAL | Status: DC

## 2013-12-21 MED ORDER — OXYCODONE HCL 5 MG PO TABS
5.0000 mg | ORAL_TABLET | Freq: Once | ORAL | Status: AC
  Administered 2013-12-21: 5 mg via ORAL

## 2013-12-21 MED ORDER — LOSARTAN POTASSIUM 100 MG PO TABS
100.0000 mg | ORAL_TABLET | Freq: Every day | ORAL | Status: DC
Start: 2013-12-21 — End: 2014-01-28

## 2013-12-21 MED ORDER — OXYCODONE HCL 5 MG PO TABS: 5.0000 mg | ORAL_TABLET | ORAL | Status: DC | PRN

## 2013-12-21 NOTE — Discharge Instructions (Signed)
Alcohol Intoxication Alcohol intoxication occurs when you drink enough alcohol that it affects your ability to function. It can be mild or very severe. Drinking a lot of alcohol in a short time is called binge drinking. This can be very harmful. Drinking alcohol can also be more dangerous if you are taking medicines or other drugs. Some of the effects caused by alcohol may include:  Loss of coordination.  Changes in mood and behavior.  Unclear thinking.  Trouble talking (slurred speech).  Throwing up (vomiting).  Confusion.  Slowed breathing.  Twitching and shaking (seizures).  Loss of consciousness. HOME CARE  Do not drive after drinking alcohol.  Drink enough water and fluids to keep your pee (urine) clear or pale yellow. Avoid caffeine.  Only take medicine as told by your doctor. GET HELP IF:  You throw up (vomit) many times.  You do not feel better after a few days.  You frequently have alcohol intoxication. Your doctor can help decide if you should see a substance use treatment counselor. GET HELP RIGHT AWAY IF:  You become shaky when you stop drinking.  You have twitching and shaking.  You throw up blood. It may look bright red or like coffee grounds.  You notice blood in your poop (bowel movements).  You become lightheaded or pass out (faint). MAKE SURE YOU:   Understand these instructions.  Will watch your condition.  Will get help right away if you are not doing well or get worse. Document Released: 08/14/2007 Document Revised: 10/28/2012 Document Reviewed: 07/31/2012 [facility] Patient Information 2015 [place], [place]. This information is not intended to replace advice given to you by your health care provider. Make sure you discuss any questions you have with your health care provider. Nonspecific Tachycardia Tachycardia is a faster than normal heartbeat (more than 100 beats per minute). In adults, the heart normally beats between 60 and 100 times a  minute. A fast heartbeat may be a normal response to exercise or stress. It does not necessarily mean that something is wrong. However, sometimes when your heart beats too fast it may not be able to pump enough blood to the rest of your body. This can result in chest pain, shortness of breath, dizziness, and even fainting. Nonspecific tachycardia means that the specific cause or pattern of your tachycardia is unknown. CAUSES  Tachycardia may be harmless or it may be due to a more serious underlying cause. Possible causes of tachycardia include:  Exercise or exertion.  Fever.  Pain or injury.  Infection.  Loss of body fluids (dehydration).  Overactive thyroid.  Lack of red blood cells (anemia).  Anxiety and stress.  Alcohol.  Caffeine.  Tobacco products.  Diet pills.  Illegal drugs.  Heart disease. SYMPTOMS  Rapid or irregular heartbeat (palpitations).  Suddenly feeling your heart beating (cardiac awareness).  Dizziness.  Tiredness (fatigue).  Shortness of breath.  Chest pain.  Nausea.  Fainting. DIAGNOSIS  Your caregiver will perform a physical exam and take your medical history. In some cases, a heart specialist (cardiologist) may be consulted. Your caregiver may also order:  Blood tests.  Electrocardiography. This test records the electrical activity of your heart.  A heart monitoring test. TREATMENT  Treatment will depend on the likely cause of your tachycardia. The goal is to treat the underlying cause of your tachycardia. Treatment methods may include:  Replacement of fluids or blood through an intravenous (IV) tube for moderate to severe dehydration or anemia.  New medicines or changes in your  current medicines.  Diet and lifestyle changes.  Treatment for certain infections.  Stress relief or relaxation methods. HOME CARE INSTRUCTIONS   Rest.  Drink enough fluids to keep your urine clear or pale yellow.  Do not  smoke.  Avoid:  Caffeine.  Tobacco.  Alcohol.  Chocolate.  Stimulants such as over-the-counter diet pills or pills that help you stay awake.  Situations that cause anxiety or stress.  Illegal drugs such as marijuana, phencyclidine (PCP), and cocaine.  Only take medicine as directed by your caregiver.  Keep all follow-up appointments as directed by your caregiver. SEEK IMMEDIATE MEDICAL CARE IF:   You have pain in your chest, upper arms, jaw, or neck.  You become weak, dizzy, or feel faint.  You have palpitations that will not go away.  You vomit, have diarrhea, or pass blood in your stool.  Your skin is cool, pale, and wet.  You have a fever that will not go away with rest, fluids, and medicine. MAKE SURE YOU:   Understand these instructions.  Will watch your condition.  Will get help right away if you are not doing well or get worse. Document Released: 04/04/2004 Document Revised: 05/20/2011 Document Reviewed: 02/05/2011 [facility] Patient Information 2015 [place], [place]. This information is not intended to replace advice given to you by your health care provider. Make sure you discuss any questions you have with your health care provider.

## 2013-12-21 NOTE — Discharge Summary (Signed)
  Discharge summary dictated on 12/21/2013 dictation number is [phone]

## 2013-12-21 NOTE — Progress Notes (Signed)
Resumed care of pt, no change from am assessment except pt is c/o all over pain 10/10, amb in halls.  Dr Terrence Dupont aware, see new order

## 2013-12-22 LAB — MRSA CULTURE

## 2013-12-22 NOTE — Discharge Summary (Signed)
NAMEMickael, Mark [name]               ACCOUNT NO.:  [id number]  MEDICAL RECORD NO.:  [id number]  LOCATION:  75                         FACILITY:  [facility]  PHYSICIAN:  Lowana Hable N. Terrence Dupont, M.D. DATE OF BIRTH:  June 09, 1965  DATE OF ADMISSION:  12/19/2013 DATE OF DISCHARGE:  12/21/2013                              DISCHARGE SUMMARY   ADMITTING DIAGNOSES: 1. Recurrent nonsustained ventricular tachycardia. 2. Hypokalemia. 3. Hypertension. 4. Gastroesophageal reflux disease. 5. Alcohol abuse. 6. Tobacco abuse. 7. Hypercholesteremia. 8. Chronic pain syndrome.  DISCHARGE DIAGNOSES: 1. Status post recurrent nonsustained ventricular tachycardia     asymptomatic in the setting of hypokalemia. 2. Status post hypokalemia. 3. Hypertension. 4. Chronic pain syndrome. 5. Gastroesophageal reflux disease. 6. Alcohol abuse. 7. History of nonischemic dilated cardiomyopathy, markedly improved     after stopping alcohol abuse. 8. Tobacco abuse. 9. Hypercholesteremia.  DISCHARGE HOME MEDICATIONS: 1. Losartan 100 mg 1 tablet daily. 2. Nicotine patch 14 mg per 24 hours daily. 3. Oxycodone 5 mg every 4 hours as needed for pain. 4. Albuterol inhaler as before 2 puffs every 6 hours. 5. Metoprolol 50 mg twice daily. 6. Lyrica 100 mg twice daily.  DIET:  Low salt, low cholesterol.  ACTIVITY:  As tolerated.  FOLLOWUP:  Follow up with me in 1 week.  Follow up with his PMD in pain clinic as scheduled.  CONDITION AT DISCHARGE:  Stable.  BRIEF HISTORY AND HOSPITAL COURSE:  Mark [name] is a 48 year old male with past medical history significant for hypertension, history of nonischemic dilated cardiomyopathy related to alcohol which has markedly improved since November 2013, history of nonsustained VT, tobacco abuse, history of alcohol abuse, chronic pain syndrome, was admitted by Dr. Doylene Canard on December 19, 2013, because of chronic back pain and was noted to have frequent PVCs and short runs of  ventricular tachycardia.  The patient was noted to be mildly hypokalemic.  The patient states he has been drinking alcohol as he ran out of his pain medications.  PAST MEDICAL HISTORY:  As above.  PAST SURGICAL HISTORY:  He had back surgery, neck surgery and nasal sinus surgery in the past.  PHYSICAL EXAMINATION:  GENERAL:  He was alert, awake, oriented x3. VITAL SIGNS:  Blood pressure was 145/103, pulse was 104.  He was afebrile. HEENT:  Conjunctivae was pink. NECK:  Supple.  No JVD.  No bruit. LUNGS:  Clear to auscultation without rhonchi or rales. CARDIOVASCULAR:  S1, S2 was normal.  There were frequent nonsustained VT and bigeminy and trigeminy on the monitor.  There was soft systolic murmur.  No S3, gallop. ABDOMEN:  Soft.  Bowel sounds were present.  Nontender. EXTREMITIES:  There is no clubbing, cyanosis, or edema. NEUROLOGIC:  Grossly intact.  LABORATORY DATA:  Sodium was 140, potassium 3.3, BUN 9, creatinine 0.65, hemoglobin was 17.9, hematocrit 49.9, white count of 16.8.  His 3 sets of cardiac enzymes were negative.  Last labs today hemoglobin is 15.3, hematocrit 44.3, white count of 8.4.  Potassium is 4.0, BUN 9, creatinine 0.88.  His urine drug screen was positive for benzodiazepine. EKG showed sinus tachycardia.  Ventricular premature contractions PVCs, prolonged QT, left atrial enlargement.  No acute ischemic changes  were noted.  BRIEF HOSPITAL COURSE:  The patient was admitted to ICU.  Potassium was replaced.  The patient did not had further significant episodes of cardiac arrhythmias.  The patient has been ambulating in hallway without any problems.  The patient states he has appointment to see Dr. Maceo Pro on Friday and requesting for pain medication until Friday.  The patient advised to refrain from drinking and smoking and drug abuse to which he agrees.  The patient states he is not suicidal at all and eager to go home.  The patient will be discharged home on  above medications and will be followed up in my office in 1 week and his PMD as scheduled.     Allegra Lai. Terrence Dupont, M.D.     MNH/MEDQ  D:  12/21/2013  T:  12/22/2013  Job:  [id number]

## 2013-12-31 ENCOUNTER — Other Ambulatory Visit (HOSPITAL_COMMUNITY): Payer: Self-pay | Admitting: Cardiology

## 2013-12-31 DIAGNOSIS — R071 Chest pain on breathing: Secondary | ICD-10-CM

## 2014-01-05 ENCOUNTER — Encounter (HOSPITAL_COMMUNITY)
Admission: RE | Admit: 2014-01-05 | Discharge: 2014-01-05 | Disposition: A | Discharge status: Home / Self Care | Payer: Self-pay | Source: Ambulatory Visit | Attending: Cardiology | Admitting: Cardiology

## 2014-01-05 ENCOUNTER — Ambulatory Visit (HOSPITAL_COMMUNITY)
Admission: RE | Admit: 2014-01-05 | Discharge: 2014-01-05 | Disposition: A | Discharge status: Home / Self Care | Payer: Self-pay | Source: Ambulatory Visit | Attending: Cardiology | Admitting: Cardiology

## 2014-01-05 DIAGNOSIS — I259 Chronic ischemic heart disease, unspecified: Secondary | ICD-10-CM | POA: Insufficient documentation

## 2014-01-05 DIAGNOSIS — R071 Chest pain on breathing: Secondary | ICD-10-CM

## 2014-01-05 MED ORDER — TECHNETIUM TC 99M SESTAMIBI GENERIC - CARDIOLITE
10.0000 | Freq: Once | INTRAVENOUS | Status: AC | PRN
  Administered 2014-01-05: 10 via INTRAVENOUS

## 2014-01-05 MED ORDER — REGADENOSON 0.4 MG/5ML IV SOLN
0.4000 mg | Freq: Once | INTRAVENOUS | Status: AC
  Administered 2014-01-05: 0.4 mg via INTRAVENOUS

## 2014-01-05 MED ORDER — REGADENOSON 0.4 MG/5ML IV SOLN
INTRAVENOUS | Status: AC
  Filled 2014-01-05: qty 5

## 2014-01-05 MED ORDER — TECHNETIUM TC 99M SESTAMIBI GENERIC - CARDIOLITE
30.0000 | Freq: Once | INTRAVENOUS | Status: AC | PRN
  Administered 2014-01-05: 30 via INTRAVENOUS

## 2014-01-13 ENCOUNTER — Encounter (HOSPITAL_COMMUNITY)
Admission: RE | Disposition: A | Discharge status: Home / Self Care | Payer: Self-pay | Source: Ambulatory Visit | Attending: Cardiology

## 2014-01-13 ENCOUNTER — Ambulatory Visit (HOSPITAL_COMMUNITY)
Admission: RE | Admit: 2014-01-13 | Discharge: 2014-01-13 | Disposition: A | Discharge status: Home / Self Care | Payer: Self-pay | Source: Ambulatory Visit | Attending: Cardiology | Admitting: Cardiology

## 2014-01-13 DIAGNOSIS — E876 Hypokalemia: Secondary | ICD-10-CM | POA: Insufficient documentation

## 2014-01-13 DIAGNOSIS — I472 Ventricular tachycardia: Secondary | ICD-10-CM | POA: Insufficient documentation

## 2014-01-13 DIAGNOSIS — R079 Chest pain, unspecified: Secondary | ICD-10-CM | POA: Insufficient documentation

## 2014-01-13 DIAGNOSIS — Z87891 Personal history of nicotine dependence: Secondary | ICD-10-CM | POA: Insufficient documentation

## 2014-01-13 DIAGNOSIS — E780 Pure hypercholesterolemia: Secondary | ICD-10-CM | POA: Insufficient documentation

## 2014-01-13 DIAGNOSIS — I10 Essential (primary) hypertension: Secondary | ICD-10-CM | POA: Insufficient documentation

## 2014-01-13 DIAGNOSIS — R0602 Shortness of breath: Secondary | ICD-10-CM | POA: Insufficient documentation

## 2014-01-13 HISTORY — PX: LEFT HEART CATHETERIZATION WITH CORONARY ANGIOGRAM: SHX5451

## 2014-01-13 SURGERY — LEFT HEART CATHETERIZATION WITH CORONARY ANGIOGRAM
Anesthesia: LOCAL

## 2014-01-13 MED ORDER — ACETAMINOPHEN 325 MG PO TABS: 650.0000 mg | ORAL_TABLET | ORAL | Status: DC | PRN

## 2014-01-13 MED ORDER — ASPIRIN 81 MG PO CHEW
CHEWABLE_TABLET | ORAL | Status: AC
  Filled 2014-01-13: qty 1

## 2014-01-13 MED ORDER — DIAZEPAM 5 MG PO TABS: 5.0000 mg | ORAL_TABLET | Freq: Once | ORAL | Status: DC

## 2014-01-13 MED ORDER — SODIUM CHLORIDE 0.9 % IJ SOLN: 3.0000 mL | INTRAMUSCULAR | Status: DC | PRN

## 2014-01-13 MED ORDER — ASPIRIN 81 MG PO CHEW
81.0000 mg | CHEWABLE_TABLET | ORAL | Status: AC
  Administered 2014-01-13: 81 mg via ORAL

## 2014-01-13 MED ORDER — LIDOCAINE HCL (PF) 1 % IJ SOLN
INTRAMUSCULAR | Status: AC
  Filled 2014-01-13: qty 30

## 2014-01-13 MED ORDER — ONDANSETRON HCL 4 MG/2ML IJ SOLN: 4.0000 mg | Freq: Four times a day (QID) | INTRAMUSCULAR | Status: DC | PRN

## 2014-01-13 MED ORDER — OXYCODONE-ACETAMINOPHEN 5-325 MG PO TABS: 1.0000 | ORAL_TABLET | ORAL | Status: DC | PRN

## 2014-01-13 MED ORDER — FENTANYL CITRATE 0.05 MG/ML IJ SOLN
INTRAMUSCULAR | Status: AC
  Filled 2014-01-13: qty 2

## 2014-01-13 MED ORDER — HEPARIN (PORCINE) IN NACL 2-0.9 UNIT/ML-% IJ SOLN
INTRAMUSCULAR | Status: AC
Start: 2014-01-13 — End: 2014-01-13
  Filled 2014-01-13: qty 1500

## 2014-01-13 MED ORDER — SODIUM CHLORIDE 0.9 % IV SOLN: 250.0000 mL | INTRAVENOUS | Status: DC | PRN

## 2014-01-13 MED ORDER — SODIUM CHLORIDE 0.9 % IJ SOLN: 3.0000 mL | Freq: Two times a day (BID) | INTRAMUSCULAR | Status: DC

## 2014-01-13 MED ORDER — SODIUM CHLORIDE 0.9 % IV SOLN
INTRAVENOUS | Status: DC
  Administered 2014-01-13: 07:00:00 via INTRAVENOUS

## 2014-01-13 MED ORDER — MIDAZOLAM HCL 2 MG/2ML IJ SOLN
INTRAMUSCULAR | Status: AC
  Filled 2014-01-13: qty 2

## 2014-01-13 MED ORDER — SODIUM CHLORIDE 0.9 % IV SOLN: INTRAVENOUS | Status: AC

## 2014-01-13 MED ORDER — NITROGLYCERIN 1 MG/10 ML FOR IR/CATH LAB
INTRA_ARTERIAL | Status: AC
  Filled 2014-01-13: qty 10

## 2014-01-13 NOTE — CV Procedure (Signed)
Left cardiac cath report dictated on 01/13/2014 dictation number is [phone]

## 2014-01-13 NOTE — Cardiovascular Report (Signed)
NAMELayn, Mark [name]               ACCOUNT NO.:  [id number]  MEDICAL RECORD NO.:  [id number]  LOCATION:  [facility]                         FACILITY:  [facility]  PHYSICIAN:  Mark [name], M.D. DATE OF BIRTH:  1966-01-08  DATE OF PROCEDURE:  01/13/2014 DATE OF DISCHARGE:                           CARDIAC CATHETERIZATION   PROCEDURE:  Left cardiac catheterization with selective left and right coronary angiography, left ventriculography via right groin using Judkins technique.  INDICATION FOR THE PROCEDURE:  Mark [name] is a 48 year old male with past medical history significant for CAD, history of  nonischemic dilated cardiomyopathy, tobacco abuse, hypercholesteremia, hypertension, history of recurrent nonsustained ventricular tachycardia, GERD, chronic pain syndrome, history of alcohol abuse in the past was seen in my office complaining of retrosternal and left-sided chest pain described as tightness, pressure associated with occasional tingling in the fingers.  Also complains of shortness of breath with minimal exertion and states lately he feels tired, fatigued.  The patient subsequently underwent nuclear stress test on January 05, 2014, which showed inferolateral wall and apical wall ischemia with wall motion abnormalities with EF of 40%.  Due to typical anginal chest pain, depressed LV systolic function, history of nonsustained ventricular tachycardia in the past, and abnormal stress test discussed with the patient regarding left cath, possible PTCA and stenting, its risks and benefits, i.e., death, MI, stroke, need for emergency CABG, local vascular complications, etc. and consented for PCI.  DESCRIPTION OF PROCEDURE:  After obtaining the informed consent, the patient was brought to the cath lab and was placed on fluoroscopy table. Right groin was prepped and draped in usual fashion.  A 1% Xylocaine was used for local anesthesia in the right groin.  With the help of thin wall  needle, a 5-French arterial sheath was placed.  The sheath was aspirated and flushed.  Next, 5-French left Judkins catheter was advanced over the wire under fluoroscopic guidance up to the ascending aorta.  Wire was pulled out.  The catheter was aspirated and connected to the Manifold.  Catheter was further advanced and engaged into left coronary ostium.  Multiple views of the left system were taken.  Next, catheter was disengaged and was pulled out over the wire and was replaced with 5-French right Judkins catheter, which was advanced over the wire under fluoroscopic guidance up to the ascending aorta.  Wire was pulled out.  The catheter was aspirated and connected to the Manifold.  Catheter was further advanced and engaged into right coronary ostium.  Multiple views of the right system were taken.  Next, catheter was disengaged and was pulled out over the wire and was replaced with 5- French pigtail catheter, which was advanced over the wire under fluoroscopic guidance up to the ascending aorta.  Wire was pulled out. The catheter was aspirated and connected to the Manifold.  Catheter was further advanced across the aortic valve into the LV.  LV pressures were recorded.  Next, LV graphy was done in 30-degree RAO position.  Post- angiographic pressures were recorded from LV and then pullback pressures were recorded from the aorta.  There was no gradient across the aortic valve.  Next, the pigtail catheter was pulled out over the  wire. Sheaths were aspirated and flushed.  FINDINGS:  LV was mildly enlarged with global hypokinesia, EF of approximately 45-50%.  Left main was long which was patent.  LAD had distal diffuse narrowing in the range of 20-30%, otherwise is patent. Diagonal 1, 2, 3, were very, very small.  Ramus is large which is patent.  Left circumflex is small which has 10-15% proximal stenosis.  OM1 was moderate sized, which was patent.  OM 2-4 were very small.  RCA has 15-20%  mid and distal junction stenosis.  PDA is small which is patent.  PLV branch is large which is patent.  The patient tolerated the procedure well.  There were no complications.  The patient was transferred to recovery room in stable condition.     Mark [name]. Mark [name], M.D.     MNH/MEDQ  D:  01/13/2014  T:  01/13/2014  Job:  [id number]

## 2014-01-13 NOTE — Interval H&P Note (Signed)
Cath Lab Visit (complete for each Cath Lab visit)  Clinical Evaluation Leading to the Procedure:   ACS: No.  Non-ACS:    Anginal Classification: CCS III  Anti-ischemic medical therapy: Maximal Therapy (2 or more classes of medications)  Non-Invasive Test Results: Intermediate-risk stress test findings: cardiac mortality 1-3%/year  Prior CABG: No previous CABG      History and Physical Interval Note:  01/13/2014 7:40 AM  Mark [name]  has presented today for surgery, with the diagnosis of cp, abnormal stress test  The various methods of treatment have been discussed with the patient and family. After consideration of risks, benefits and other options for treatment, the patient has consented to  Procedure(s): LEFT HEART CATHETERIZATION WITH CORONARY ANGIOGRAM (N/A) as a surgical intervention .  The patient's history has been reviewed, patient examined, no change in status, stable for surgery.  I have reviewed the patient's chart and labs.  Questions were answered to the patient's satisfaction.     Clent Demark

## 2014-01-13 NOTE — Progress Notes (Signed)
Site area:RFA  Site Prior to Removal:  Level 0 Pressure Applied For:25 min Manual:   yes Patient Status During Pullstable:   Post Pull Site:  Level Post Pull Instructions Given: yes  Post Pull Pulses Present: palpable Dressing Applied:pressure   Bedrest begins @0900   Comments: no complications

## 2014-01-13 NOTE — Discharge Instructions (Signed)
Angiogram, Care After °Refer to this sheet in the next few weeks. These instructions provide you with information on caring for yourself after your procedure. Your health care provider may also give you more specific instructions. Your treatment has been planned according to current medical practices, but problems sometimes occur. Call your health care provider if you have any problems or questions after your procedure.  °WHAT TO EXPECT AFTER THE PROCEDURE °After your procedure, it is typical to have the following sensations: °· Minor discomfort or tenderness and a small bump at the catheter insertion site. The bump should usually decrease in size and tenderness within 1 to 2 weeks. °· Any bruising will usually fade within 2 to 4 weeks. °HOME CARE INSTRUCTIONS  °· You may need to keep taking blood thinners if they were prescribed for you. Take medicines only as directed by your health care provider. °· Do not apply powder or lotion to the site. °· Do not take baths, swim, or use a hot tub until your health care provider approves. °· You may shower 24 hours after the procedure. Remove the bandage (dressing) and gently wash the site with plain soap and water. Gently pat the site dry. °· Inspect the site at least twice daily. °· Limit your activity for the first 48 hours. Do not bend, squat, or lift anything over 20 lb (9 kg) or as directed by your health care provider. °· Plan to have someone take you home after the procedure. Follow instructions about when you can drive or return to work. °SEEK MEDICAL CARE IF: °· You get light-headed when standing up. °· You have drainage (other than a small amount of blood on the dressing). °· You have chills. °· You have a fever. °· You have redness, warmth, swelling, or pain at the insertion site. °SEEK IMMEDIATE MEDICAL CARE IF:  °· You develop chest pain or shortness of breath, feel faint, or pass out. °· You have bleeding, swelling larger than a walnut, or drainage from the  catheter insertion site. °· You develop pain, discoloration, coldness, or severe bruising in the leg or arm that held the catheter. °· You have heavy bleeding from the site. If this happens, hold pressure on the site and call 911. °MAKE SURE YOU: °· Understand these instructions. °· Will watch your condition. °· Will get help right away if you are not doing well or get worse. °Document Released: 09/13/2004 Document Revised: 07/12/2013 Document Reviewed: 07/20/2012 °[employer]® Patient Information ©2015 [employer]. This information is not intended to replace advice given to you by your health care provider. Make sure you discuss any questions you have with your health care provider. ° °

## 2014-01-13 NOTE — H&P (Signed)
Dictated H&P in the chart needs to be scanned 

## 2014-01-13 NOTE — H&P (View-Only) (Signed)
Subjective:  Patient denies any chest pain or shortness of breath. Denies palpitation lightheadedness or syncope. No further episodes of V. tach. On the monitor. Continues to have generalized pain .  Objective:  Vital Signs in the last 24 hours: Temp:  [97.8 F (36.6 C)-98.2 F (36.8 C)] 97.8 F (36.6 C) (10/12 1600) Pulse Rate:  [63-124] 81 (10/12 1445) Resp:  [12-38] 38 (10/12 1600) BP: (108-174)/(66-113) 108/74 mmHg (10/12 1600) SpO2:  [88 %-98 %] 94 % (10/12 1600) Weight:  [96.2 kg (212 lb 1.3 oz)-97.1 kg (214 lb 1.1 oz)] 96.2 kg (212 lb 1.3 oz) (10/12 0500)  Intake/Output from previous day: 10/11 0701 - 10/12 0700 In: 1030 [P.O.:880; I.V.:100; IV Piggyback:50] Out: 4268 [Urine:1750] Intake/Output from this shift: Total I/O In: 960 [P.O.:960] Out: -   Physical Exam: Neck: no adenopathy, no carotid bruit, no JVD and supple, symmetrical, trachea midline Lungs: clear to auscultation bilaterally Heart: regular rate and rhythm, S1, S2 normal and Soft systolic murmur noted no S3 gallop Abdomen: soft, non-tender; bowel sounds normal; no masses,  no organomegaly Extremities: extremities normal, atraumatic, no cyanosis or edema  Lab Results:  Recent Labs  12/19/13 1639 12/20/13 0008  WBC 16.8* 14.1*  HGB 17.9* 16.1  PLT 356 320    Recent Labs  12/19/13 1639 12/20/13 0008  NA 140 135*  K 3.3* 3.5*  CL 96 94*  CO2 20 25  GLUCOSE 138* 136*  BUN 9 9  CREATININE 0.65 0.71    Recent Labs  12/20/13 0008 12/20/13 0630  TROPONINI <0.30 <0.30   Hepatic Function Panel  Recent Labs  12/19/13 1639  PROT 6.9  ALBUMIN 3.9  AST 15  ALT 13  ALKPHOS 104  BILITOT 0.7   No results found for this basename: CHOL,  in the last 72 hours No results found for this basename: PROTIME,  in the last 72 hours  Imaging: Imaging results have been reviewed and No results found.  Cardiac Studies:  Assessment/Plan:  Status post Recurrent nonsustained VT in the setting of  hypokalemia Hypokalemia  Hypertension  GERD  Alcohol abuse  Tobacco abuse  Hypercholesteremia Chronic pain syndrome Plan Transfer to telemetry Check labs in a.m.   LOS: 1 day    Mark [name] N 12/20/2013, 6:05 PM

## 2014-01-22 ENCOUNTER — Emergency Department (HOSPITAL_COMMUNITY)
Admission: EM | Admit: 2014-01-22 | Discharge: 2014-01-22 | Disposition: A | Discharge status: Other Acute Inpatient Hospital | Payer: Self-pay | Attending: Emergency Medicine | Admitting: Emergency Medicine

## 2014-01-22 ENCOUNTER — Inpatient Hospital Stay (HOSPITAL_COMMUNITY)
Admission: AD | Admit: 2014-01-22 | Discharge: 2014-01-28 | DRG: 897 | Disposition: A | Discharge status: Home / Self Care | Payer: Federal, State, Local not specified - Other | Source: Intra-hospital | Attending: Psychiatry | Admitting: Psychiatry

## 2014-01-22 ENCOUNTER — Encounter (HOSPITAL_COMMUNITY): Payer: Self-pay | Admitting: *Deleted

## 2014-01-22 ENCOUNTER — Encounter (HOSPITAL_COMMUNITY): Payer: Self-pay

## 2014-01-22 DIAGNOSIS — I472 Ventricular tachycardia: Secondary | ICD-10-CM

## 2014-01-22 DIAGNOSIS — F10239 Alcohol dependence with withdrawal, unspecified: Secondary | ICD-10-CM | POA: Diagnosis present

## 2014-01-22 DIAGNOSIS — F1120 Opioid dependence, uncomplicated: Secondary | ICD-10-CM | POA: Diagnosis present

## 2014-01-22 DIAGNOSIS — E876 Hypokalemia: Secondary | ICD-10-CM | POA: Diagnosis present

## 2014-01-22 DIAGNOSIS — F10129 Alcohol abuse with intoxication, unspecified: Secondary | ICD-10-CM | POA: Insufficient documentation

## 2014-01-22 DIAGNOSIS — I4729 Other ventricular tachycardia: Secondary | ICD-10-CM

## 2014-01-22 DIAGNOSIS — F1010 Alcohol abuse, uncomplicated: Secondary | ICD-10-CM

## 2014-01-22 DIAGNOSIS — E785 Hyperlipidemia, unspecified: Secondary | ICD-10-CM | POA: Diagnosis present

## 2014-01-22 DIAGNOSIS — M549 Dorsalgia, unspecified: Secondary | ICD-10-CM | POA: Insufficient documentation

## 2014-01-22 DIAGNOSIS — F17210 Nicotine dependence, cigarettes, uncomplicated: Secondary | ICD-10-CM | POA: Diagnosis present

## 2014-01-22 DIAGNOSIS — I10 Essential (primary) hypertension: Secondary | ICD-10-CM | POA: Diagnosis present

## 2014-01-22 DIAGNOSIS — I426 Alcoholic cardiomyopathy: Secondary | ICD-10-CM | POA: Diagnosis present

## 2014-01-22 DIAGNOSIS — R569 Unspecified convulsions: Secondary | ICD-10-CM | POA: Diagnosis present

## 2014-01-22 DIAGNOSIS — F419 Anxiety disorder, unspecified: Secondary | ICD-10-CM | POA: Insufficient documentation

## 2014-01-22 DIAGNOSIS — Z79899 Other long term (current) drug therapy: Secondary | ICD-10-CM | POA: Insufficient documentation

## 2014-01-22 DIAGNOSIS — F39 Unspecified mood [affective] disorder: Secondary | ICD-10-CM | POA: Diagnosis present

## 2014-01-22 DIAGNOSIS — M791 Myalgia: Secondary | ICD-10-CM | POA: Insufficient documentation

## 2014-01-22 DIAGNOSIS — K219 Gastro-esophageal reflux disease without esophagitis: Secondary | ICD-10-CM | POA: Insufficient documentation

## 2014-01-22 DIAGNOSIS — F1020 Alcohol dependence, uncomplicated: Secondary | ICD-10-CM | POA: Insufficient documentation

## 2014-01-22 DIAGNOSIS — G629 Polyneuropathy, unspecified: Secondary | ICD-10-CM | POA: Insufficient documentation

## 2014-01-22 DIAGNOSIS — R0789 Other chest pain: Secondary | ICD-10-CM | POA: Insufficient documentation

## 2014-01-22 DIAGNOSIS — Z720 Tobacco use: Secondary | ICD-10-CM | POA: Insufficient documentation

## 2014-01-22 DIAGNOSIS — F1024 Alcohol dependence with alcohol-induced mood disorder: Secondary | ICD-10-CM

## 2014-01-22 DIAGNOSIS — I4581 Long QT syndrome: Secondary | ICD-10-CM | POA: Diagnosis present

## 2014-01-22 DIAGNOSIS — Z8639 Personal history of other endocrine, nutritional and metabolic disease: Secondary | ICD-10-CM | POA: Insufficient documentation

## 2014-01-22 DIAGNOSIS — G8929 Other chronic pain: Secondary | ICD-10-CM | POA: Insufficient documentation

## 2014-01-22 HISTORY — DX: Alcohol dependence with withdrawal, unspecified: F10.239

## 2014-01-22 HISTORY — DX: Unspecified convulsions: R56.9

## 2014-01-22 HISTORY — DX: Long QT syndrome: I45.81

## 2014-01-22 LAB — COMPREHENSIVE METABOLIC PANEL
ALT: 27 U/L (ref 0–53)
AST: 22 U/L (ref 0–37)
Albumin: 3.5 g/dL (ref 3.5–5.2)
Alkaline Phosphatase: 100 U/L (ref 39–117)
Anion gap: 15 (ref 5–15)
BUN: 5 mg/dL — ABNORMAL LOW (ref 6–23)
CO2: 25 mEq/L (ref 19–32)
Calcium: 9.5 mg/dL (ref 8.4–10.5)
Chloride: 98 mEq/L (ref 96–112)
Creatinine, Ser: 0.72 mg/dL (ref 0.50–1.35)
GFR calc Af Amer: >90 mL/min (ref >90)
GFR calc non Af Amer: >90 mL/min (ref >90)
Glucose, Bld: 124 mg/dL — ABNORMAL HIGH (ref 70–99)
Potassium: 3.9 mEq/L (ref 3.7–5.3)
Sodium: 138 mEq/L (ref 137–147)
Total Bilirubin: 0.5 mg/dL (ref 0.3–1.2)
Total Protein: 6.6 g/dL (ref 6.0–8.3)

## 2014-01-22 LAB — TROPONIN I

## 2014-01-22 LAB — I-STAT CHEM 8, ED
BUN: <3 mg/dL — ABNORMAL LOW (ref 6–23)
CALCIUM ION: 1.15 mmol/L (ref 1.12–1.23)
CHLORIDE: 99 meq/L (ref 96–112)
CREATININE: 0.7 mg/dL (ref 0.50–1.35)
Calcium, Ion: 1.08 mmol/L — ABNORMAL LOW (ref 1.12–1.23)
Chloride: 98 mEq/L (ref 96–112)
Creatinine, Ser: 1 mg/dL (ref 0.50–1.35)
Glucose, Bld: 117 mg/dL — ABNORMAL HIGH (ref 70–99)
Glucose, Bld: 124 mg/dL — ABNORMAL HIGH (ref 70–99)
HCT: 53 % — ABNORMAL HIGH (ref 39.0–52.0)
HCT: 47 % (ref 39.0–52.0)
Hemoglobin: 18 g/dL — ABNORMAL HIGH (ref 13.0–17.0)
Hemoglobin: 16 g/dL (ref 13.0–17.0)
Potassium: 2.9 mEq/L — CL (ref 3.7–5.3)
Potassium: 4 mEq/L (ref 3.7–5.3)
SODIUM: 139 meq/L (ref 137–147)
Sodium: 137 mEq/L (ref 137–147)
TCO2: 21 mmol/L (ref 0–100)
TCO2: 22 mmol/L (ref 0–100)

## 2014-01-22 LAB — CBC WITH DIFFERENTIAL/PLATELET
BASOS ABS: 0 10*3/uL (ref 0.0–0.1)
Basophils Relative: 0 % (ref 0–1)
EOS PCT: 0 % (ref 0–5)
Eosinophils Absolute: 0 10*3/uL (ref 0.0–0.7)
HCT: 48.1 % (ref 39.0–52.0)
Hemoglobin: 17.2 g/dL — ABNORMAL HIGH (ref 13.0–17.0)
Lymphocytes Relative: 23 % (ref 12–46)
Lymphs Abs: 3 10*3/uL (ref 0.7–4.0)
MCH: 27.9 pg (ref 26.0–34.0)
MCHC: 35.8 g/dL (ref 30.0–36.0)
MCV: 78 fL (ref 78.0–100.0)
Monocytes Absolute: 0.7 10*3/uL (ref 0.1–1.0)
Monocytes Relative: 6 % (ref 3–12)
NEUTROS ABS: 9.3 10*3/uL — AB (ref 1.7–7.7)
Neutrophils Relative %: 71 % (ref 43–77)
PLATELETS: 394 10*3/uL (ref 150–400)
RBC: 6.17 MIL/uL — ABNORMAL HIGH (ref 4.22–5.81)
RDW: 13.1 % (ref 11.5–15.5)
WBC: 13.2 10*3/uL — ABNORMAL HIGH (ref 4.0–10.5)

## 2014-01-22 LAB — TSH: TSH: 0.581 u[IU]/mL (ref 0.350–4.500)

## 2014-01-22 LAB — ETHANOL: ALCOHOL ETHYL (B): 157 mg/dL — AB (ref 0–11)

## 2014-01-22 MED ORDER — LOPERAMIDE HCL 2 MG PO CAPS: 2.0000 mg | ORAL_CAPSULE | ORAL | Status: DC | PRN

## 2014-01-22 MED ORDER — PREGABALIN 50 MG PO CAPS
100.0000 mg | ORAL_CAPSULE | Freq: Two times a day (BID) | ORAL | Status: DC
  Administered 2014-01-22: 100 mg via ORAL
  Filled 2014-01-22: qty 2

## 2014-01-22 MED ORDER — TRAZODONE HCL 100 MG PO TABS
100.0000 mg | ORAL_TABLET | Freq: Every evening | ORAL | Status: DC | PRN
  Administered 2014-01-22 (×2): 100 mg via ORAL
  Filled 2014-01-22 (×6): qty 1

## 2014-01-22 MED ORDER — PANTOPRAZOLE SODIUM 40 MG PO TBEC
40.0000 mg | DELAYED_RELEASE_TABLET | Freq: Two times a day (BID) | ORAL | Status: DC
  Administered 2014-01-22: 40 mg via ORAL
  Filled 2014-01-22 (×2): qty 1

## 2014-01-22 MED ORDER — THIAMINE HCL 100 MG/ML IJ SOLN: 100.0000 mg | Freq: Once | INTRAMUSCULAR | Status: DC

## 2014-01-22 MED ORDER — MAGNESIUM HYDROXIDE 400 MG/5ML PO SUSP: 30.0000 mL | Freq: Every day | ORAL | Status: DC | PRN

## 2014-01-22 MED ORDER — LORAZEPAM 1 MG PO TABS
1.0000 mg | ORAL_TABLET | Freq: Four times a day (QID) | ORAL | Status: AC
  Administered 2014-01-22 – 2014-01-23 (×4): 1 mg via ORAL
  Filled 2014-01-22 (×5): qty 1

## 2014-01-22 MED ORDER — HYDROXYZINE HCL 25 MG PO TABS: 25.0000 mg | ORAL_TABLET | Freq: Four times a day (QID) | ORAL | Status: DC | PRN

## 2014-01-22 MED ORDER — METOPROLOL TARTRATE 25 MG PO TABS
50.0000 mg | ORAL_TABLET | Freq: Two times a day (BID) | ORAL | Status: DC
  Administered 2014-01-22: 50 mg via ORAL
  Filled 2014-01-22: qty 2

## 2014-01-22 MED ORDER — ONDANSETRON 4 MG PO TBDP
4.0000 mg | ORAL_TABLET | Freq: Four times a day (QID) | ORAL | Status: DC | PRN
Start: 2014-01-22 — End: 2014-01-22

## 2014-01-22 MED ORDER — METHOCARBAMOL 500 MG PO TABS: 500.0000 mg | ORAL_TABLET | Freq: Three times a day (TID) | ORAL | Status: DC | PRN

## 2014-01-22 MED ORDER — CLONIDINE HCL 0.1 MG PO TABS
0.1000 mg | ORAL_TABLET | Freq: Four times a day (QID) | ORAL | Status: DC
  Administered 2014-01-22 (×2): 0.1 mg via ORAL
  Filled 2014-01-22 (×2): qty 1

## 2014-01-22 MED ORDER — LORAZEPAM 1 MG PO TABS
1.0000 mg | ORAL_TABLET | Freq: Every day | ORAL | Status: AC
  Administered 2014-01-23: 1 mg via ORAL

## 2014-01-22 MED ORDER — LOSARTAN POTASSIUM 50 MG PO TABS
100.0000 mg | ORAL_TABLET | Freq: Every day | ORAL | Status: DC
  Administered 2014-01-22: 100 mg via ORAL
  Filled 2014-01-22 (×2): qty 2

## 2014-01-22 MED ORDER — LORAZEPAM 1 MG PO TABS
1.0000 mg | ORAL_TABLET | Freq: Four times a day (QID) | ORAL | Status: AC | PRN
  Administered 2014-01-22 – 2014-01-25 (×6): 1 mg via ORAL
  Filled 2014-01-22 (×6): qty 1

## 2014-01-22 MED ORDER — ADULT MULTIVITAMIN W/MINERALS CH
1.0000 | ORAL_TABLET | Freq: Every day | ORAL | Status: DC
  Administered 2014-01-23 – 2014-01-28 (×6): 1 via ORAL
  Filled 2014-01-22 (×10): qty 1

## 2014-01-22 MED ORDER — ASPIRIN 81 MG PO CHEW
324.0000 mg | CHEWABLE_TABLET | Freq: Once | ORAL | Status: AC
Start: 2014-01-22 — End: 2014-01-22
  Administered 2014-01-22: 324 mg via ORAL
  Filled 2014-01-22: qty 4

## 2014-01-22 MED ORDER — CLONIDINE HCL 0.1 MG PO TABS: 0.1000 mg | ORAL_TABLET | Freq: Every day | ORAL | Status: DC

## 2014-01-22 MED ORDER — CLONIDINE HCL 0.1 MG PO TABS: 0.1000 mg | ORAL_TABLET | ORAL | Status: DC

## 2014-01-22 MED ORDER — VITAMIN B-1 100 MG PO TABS
100.0000 mg | ORAL_TABLET | Freq: Every day | ORAL | Status: DC
  Administered 2014-01-23 – 2014-01-28 (×6): 100 mg via ORAL
  Filled 2014-01-22 (×8): qty 1
  Filled 2014-01-22: qty 14

## 2014-01-22 MED ORDER — GABAPENTIN 400 MG PO CAPS
400.0000 mg | ORAL_CAPSULE | Freq: Three times a day (TID) | ORAL | Status: DC
  Administered 2014-01-22: 400 mg via ORAL
  Filled 2014-01-22 (×2): qty 1

## 2014-01-22 MED ORDER — LORAZEPAM 1 MG PO TABS
1.0000 mg | ORAL_TABLET | Freq: Two times a day (BID) | ORAL | Status: AC
  Administered 2014-01-25 (×2): 1 mg via ORAL
  Filled 2014-01-22 (×3): qty 1

## 2014-01-22 MED ORDER — DICYCLOMINE HCL 20 MG PO TABS: 20.0000 mg | ORAL_TABLET | Freq: Four times a day (QID) | ORAL | Status: DC | PRN

## 2014-01-22 MED ORDER — POTASSIUM CHLORIDE CRYS ER 20 MEQ PO TBCR
40.0000 meq | EXTENDED_RELEASE_TABLET | Freq: Once | ORAL | Status: AC
  Administered 2014-01-22: 40 meq via ORAL
  Filled 2014-01-22: qty 2

## 2014-01-22 MED ORDER — LORAZEPAM 1 MG PO TABS
0.0000 mg | ORAL_TABLET | Freq: Four times a day (QID) | ORAL | Status: DC
  Administered 2014-01-22 (×2): 1 mg via ORAL
  Filled 2014-01-22 (×2): qty 1

## 2014-01-22 MED ORDER — LORAZEPAM 1 MG PO TABS: 0.0000 mg | ORAL_TABLET | Freq: Two times a day (BID) | ORAL | Status: DC

## 2014-01-22 MED ORDER — LORAZEPAM 1 MG PO TABS
1.0000 mg | ORAL_TABLET | Freq: Three times a day (TID) | ORAL | Status: AC
  Administered 2014-01-24 (×3): 1 mg via ORAL
  Filled 2014-01-22 (×2): qty 1

## 2014-01-22 MED ORDER — ALBUTEROL SULFATE HFA 108 (90 BASE) MCG/ACT IN AERS: 2.0000 | INHALATION_SPRAY | RESPIRATORY_TRACT | Status: DC | PRN

## 2014-01-22 MED ORDER — NAPROXEN 500 MG PO TABS: 500.0000 mg | ORAL_TABLET | Freq: Two times a day (BID) | ORAL | Status: DC | PRN

## 2014-01-22 MED ORDER — ACETAMINOPHEN 325 MG PO TABS
650.0000 mg | ORAL_TABLET | Freq: Four times a day (QID) | ORAL | Status: DC | PRN
  Administered 2014-01-22 – 2014-01-23 (×2): 650 mg via ORAL
  Filled 2014-01-22 (×2): qty 2

## 2014-01-22 MED ORDER — ONDANSETRON 4 MG PO TBDP
4.0000 mg | ORAL_TABLET | Freq: Four times a day (QID) | ORAL | Status: DC | PRN
  Administered 2014-01-22 – 2014-01-23 (×2): 4 mg via ORAL
  Filled 2014-01-22 (×2): qty 1

## 2014-01-22 MED ORDER — HYDROXYZINE HCL 25 MG PO TABS
25.0000 mg | ORAL_TABLET | Freq: Four times a day (QID) | ORAL | Status: DC | PRN
  Administered 2014-01-23 (×2): 25 mg via ORAL
  Filled 2014-01-22 (×2): qty 1

## 2014-01-22 MED ORDER — CYCLOBENZAPRINE HCL 10 MG PO TABS: 10.0000 mg | ORAL_TABLET | Freq: Three times a day (TID) | ORAL | Status: DC | PRN

## 2014-01-22 MED ORDER — ALUM & MAG HYDROXIDE-SIMETH 200-200-20 MG/5ML PO SUSP: 30.0000 mL | ORAL | Status: DC | PRN

## 2014-01-22 MED ORDER — HYDROXYZINE HCL 50 MG PO TABS
50.0000 mg | ORAL_TABLET | Freq: Every day | ORAL | Status: DC
  Filled 2014-01-22: qty 1

## 2014-01-22 NOTE — ED Notes (Signed)
Pt wanded by security and belongings removed.

## 2014-01-22 NOTE — Progress Notes (Signed)
D:Patient in the hallway on approach.  Patient states he is having bad alcohol withdrawals.  Patient states he is concerned that he will not sleep tonight.  Patient states he has been drinking a lot.  Patient new to the unit tonight and states he is still trying to get adjusted.   Patient denies SI/HI and denies AVH. A: Staff to monitor Q 15 mins for safety.  Encouragement and support offered.  Scheduled medications administered per orders.Ativan administered prn for withdrawals.  Zofran administered prn for nausea. R: Patient remains safe on the unit.  Patient attended group tonight.  Patient visible on the unit and interacting with peers.  Patient taking administered medications.

## 2014-01-22 NOTE — Consult Note (Signed)
[facility] Consult   Reason for Consult:  Alcohol Detoxification and opioid dependence Referring Physician:  EDP  Mark [name] is an 48 y.o. male. Total Time spent with patient: 30 minutes  Assessment: AXIS I:  Substance Induced Mood Disorder Alcohol dependency severe, opioid dependency AXIS II:  Deferred AXIS III:   Past Medical History  Diagnosis Date  . Hypertension   . Hyperlipidemia   . 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 ETOH abuse   . GERD (gastroesophageal reflux disease)   . Mental disorder   . Irregular heart beat   . Anxiety   . Shortness of breath   . Headache(784.0)   . Hepatitis   . Withdrawal seizures   . Alcohol withdrawal   . Neuropathy 06/07/2013   AXIS IV:  other psychosocial or environmental problems AXIS V:  41-50 serious symptoms  Plan:  Recommend psychiatric Inpatient admission when medically cleared.  Subjective:   Mark [name] is a 48 y.o. male patient admitted with long standing history of polysubstance abuse and is here for detoxification   HPI:  [name] 48 year old caucasian male who presented to the emergency department with a Blood alcohol level of 157.  Patient states that he drinks approximately 1/5 of liquor a day as a way to decrease the chronic pain he feels in his neck, back and legs.  Patient has been drinking heavily for approximately 8 years and states that he needs assistance in obtaining sobriety.  Patient would like to detox from alcohol and enter a substance abuse program to work through his addiction to pain medications as well as alcohol.  Patient admits to using approximately 200mg  of oxycodone per day and began drinking more heavily to manage his pain. Patient has history of detox 1 year ago at behavioral health,  States that was able to briefly maintain sobriety.  Patient appears tremulous with coarse hand tremors, c/o feeling sweaty and having chills.  Placed on  the ativan detoxification protocol.   Patient denies all symptoms of depression, no history of mania.  Patient denies current suicidal ideation, homicidal ideation.  Patient is logical and goal directed in conversation, no evidence of delusions,  No auditory, or visual hallucinations.  No history of seizures.   HPI Elements:   Location:  generalized. Quality:  acute. Severity:  severe. Timing:  continuous. Duration:  exacerbation of chronic condition past few weeks. Context:  stressors,.  Past Psychiatric History: Past Medical History  Diagnosis Date  . Hypertension   . Hyperlipidemia   . 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 ETOH abuse   . GERD (gastroesophageal reflux disease)   . Mental disorder   . Irregular heart beat   . Anxiety   . Shortness of breath   . Headache(784.0)   . Hepatitis   . Withdrawal seizures   . Alcohol withdrawal   . Neuropathy 06/07/2013    reports that he has been smoking Cigarettes.  He has a 15 pack-year smoking history. He has never used smokeless tobacco. He reports that he drinks about 4.2 oz of alcohol per week. He reports that he uses illicit drugs (Oxycodone). Family History  Problem Relation Age of Onset  . Breast cancer Paternal Grandmother   . Lung cancer      was a smoker  . Hypertension Mother  Allergies:   Allergies  Allergen Reactions  . Lisinopril Anaphylaxis and Swelling    Per Dr. Terrence Dupont (12/20/13), patient can tolerate losartan.    ACT Assessment Complete:  Yes:    Educational Status    Risk to Self: Risk to self with the past 6 months Is patient at risk for suicide?: No Substance abuse history and/or treatment for substance abuse?: Yes  Risk to Others:    Abuse:    Prior Inpatient Therapy:    Prior Outpatient Therapy:    Additional Information:                    Objective: Blood pressure 134/85, pulse 79, temperature 98.5 F (36.9 C), temperature  source Oral, resp. rate 18, SpO2 97 %.There is no weight on file to calculate BMI. Results for orders placed or performed during the hospital encounter of 01/22/14 (from the past 72 hour(s))  CBC with Differential     Status: Abnormal   Collection Time: 01/22/14  2:12 AM  Result Value Ref Range   WBC 13.2 (H) 4.0 - 10.5 K/uL   RBC 6.17 (H) 4.22 - 5.81 MIL/uL   Hemoglobin 17.2 (H) 13.0 - 17.0 g/dL   HCT 48.1 39.0 - 52.0 %   MCV 78.0 78.0 - 100.0 fL   MCH 27.9 26.0 - 34.0 pg   MCHC 35.8 30.0 - 36.0 g/dL   RDW 13.1 11.5 - 15.5 %   Platelets 394 150 - 400 K/uL   Neutrophils Relative % 71 43 - 77 %   Neutro Abs 9.3 (H) 1.7 - 7.7 K/uL   Lymphocytes Relative 23 12 - 46 %   Lymphs Abs 3.0 0.7 - 4.0 K/uL   Monocytes Relative 6 3 - 12 %   Monocytes Absolute 0.7 0.1 - 1.0 K/uL   Eosinophils Relative 0 0 - 5 %   Eosinophils Absolute 0.0 0.0 - 0.7 K/uL   Basophils Relative 0 0 - 1 %   Basophils Absolute 0.0 0.0 - 0.1 K/uL  Troponin I     Status: None   Collection Time: 01/22/14  2:12 AM  Result Value Ref Range   Troponin I <0.30 <0.30 ng/mL    Comment:        Due to the release kinetics of cTnI, a negative result within the first hours of the onset of symptoms does not rule out myocardial infarction with certainty. If myocardial infarction is still suspected, repeat the test at appropriate intervals.   I-stat chem 8, ed     Status: Abnormal   Collection Time: 01/22/14  2:26 AM  Result Value Ref Range   Sodium 139 137 - 147 mEq/L   Potassium 2.9 (LL) 3.7 - 5.3 mEq/L   Chloride 98 96 - 112 mEq/L   BUN <3 (L) 6 - 23 mg/dL   Creatinine, Ser 1.00 0.50 - 1.35 mg/dL   Glucose, Bld 117 (H) 70 - 99 mg/dL   Calcium, Ion 1.08 (L) 1.12 - 1.23 mmol/L   TCO2 21 0 - 100 mmol/L   Hemoglobin 18.0 (H) 13.0 - 17.0 g/dL   HCT 53.0 (H) 39.0 - 52.0 %   Comment NOTIFIED PHYSICIAN   Ethanol     Status: Abnormal   Collection Time: 01/22/14  4:00 AM  Result Value Ref Range   Alcohol, Ethyl (B) 157 (H)  0 - 11 mg/dL    Comment: MODERATE HEMOLYSIS HEMOLYSIS AT THIS LEVEL MAY AFFECT RESULT        LOWEST  DETECTABLE LIMIT FOR SERUM ALCOHOL IS 11 mg/dL FOR MEDICAL PURPOSES ONLY   Troponin I     Status: None   Collection Time: 01/22/14  4:00 AM  Result Value Ref Range   Troponin I <0.30 <0.30 ng/mL    Comment:        Due to the release kinetics of cTnI, a negative result within the first hours of the onset of symptoms does not rule out myocardial infarction with certainty. If myocardial infarction is still suspected, repeat the test at appropriate intervals.    Labs are reviewed and are pertinent for medical issues being treated  Current Facility-Administered Medications  Medication Dose Route Frequency Provider Last Rate Last Dose  . albuterol (PROVENTIL HFA;VENTOLIN HFA) 108 (90 BASE) MCG/ACT inhaler 2 puff  2 puff Inhalation Q4H PRN Garald Balding, NP      . cloNIDine (CATAPRES) tablet 0.1 mg  0.1 mg Oral QID Jenne Campus, MD   0.1 mg at 01/22/14 7026   Followed by  . [START ON 01/24/2014] cloNIDine (CATAPRES) tablet 0.1 mg  0.1 mg Oral BH-qamhs Jenne Campus, MD       Followed by  . [START ON 01/26/2014] cloNIDine (CATAPRES) tablet 0.1 mg  0.1 mg Oral QAC breakfast Myer Peer Fouad Taul, MD      . cyclobenzaprine (FLEXERIL) tablet 10 mg  10 mg Oral TID PRN Garald Balding, NP      . dicyclomine (BENTYL) tablet 20 mg  20 mg Oral Q6H PRN Jenne Campus, MD      . gabapentin (NEURONTIN) capsule 400 mg  400 mg Oral TID Garald Balding, NP   400 mg at 01/22/14 3785  . hydrOXYzine (ATARAX/VISTARIL) tablet 25 mg  25 mg Oral Q6H PRN Jenne Campus, MD      . hydrOXYzine (ATARAX/VISTARIL) tablet 50 mg  50 mg Oral QHS Garald Balding, NP      . loperamide (IMODIUM) capsule 2-4 mg  2-4 mg Oral PRN Jenne Campus, MD      . LORazepam (ATIVAN) tablet 0-4 mg  0-4 mg Oral 4 times per day Garald Balding, NP   1 mg at 01/22/14 1206   Followed by  . [START ON 01/24/2014] LORazepam (ATIVAN) tablet  0-4 mg  0-4 mg Oral Q12H Garald Balding, NP      . losartan (COZAAR) tablet 100 mg  100 mg Oral Daily Garald Balding, NP      . methocarbamol (ROBAXIN) tablet 500 mg  500 mg Oral Q8H PRN Jenne Campus, MD      . metoprolol tartrate (LOPRESSOR) tablet 50 mg  50 mg Oral BID Garald Balding, NP   50 mg at 01/22/14 8850  . naproxen (NAPROSYN) tablet 500 mg  500 mg Oral BID PRN Jenne Campus, MD      . ondansetron (ZOFRAN-ODT) disintegrating tablet 4 mg  4 mg Oral Q6H PRN Jenne Campus, MD      . pantoprazole (PROTONIX) EC tablet 40 mg  40 mg Oral BID AC Garald Balding, NP   40 mg at 01/22/14 0817  . pregabalin (LYRICA) capsule 100 mg  100 mg Oral BID Garald Balding, NP   100 mg at 01/22/14 2774   Current Outpatient Prescriptions  Medication Sig Dispense Refill  . albuterol (PROVENTIL HFA;VENTOLIN HFA) 108 (90 BASE) MCG/ACT inhaler Inhale 2 puffs into the lungs every 4 (four) hours as needed for wheezing or shortness of breath.    Marland Kitchen  ALPRAZolam (XANAX) 0.25 MG tablet Take 0.25 mg by mouth 2 (two) times daily.    . cyclobenzaprine (FLEXERIL) 10 MG tablet Take 10 mg by mouth 3 (three) times daily as needed for muscle spasms.   2  . FOLIC ACID PO Take 1 tablet by mouth daily.    Marland Kitchen gabapentin (NEURONTIN) 400 MG capsule Take 400 mg by mouth 3 (three) times daily.   5  . hydrOXYzine (VISTARIL) 25 MG capsule Take 50 mg by mouth at bedtime.   2  . losartan (COZAAR) 100 MG tablet Take 1 tablet (100 mg total) by mouth daily. 30 tablet 3  . metoprolol (LOPRESSOR) 50 MG tablet Take 50 mg by mouth 2 (two) times daily.    . pantoprazole (PROTONIX) 40 MG tablet Take 40 mg by mouth 2 (two) times daily before a meal.   5  . Thiamine HCl (VITAMIN B-1 PO) Take 1 tablet by mouth daily.    . nicotine (NICODERM CQ - DOSED IN MG/24 HOURS) 14 mg/24hr patch Place 1 patch (14 mg total) onto the skin daily. 28 patch 0  . oxyCODONE (OXY IR/ROXICODONE) 5 MG immediate release tablet Take 1 tablet (5 mg total) by mouth every 4  (four) hours as needed for moderate pain. 30 tablet 0  . oxyCODONE (ROXICODONE) 15 MG immediate release tablet Take 15 mg by mouth every 4 (four) hours as needed for pain.   0  . pregabalin (LYRICA) 100 MG capsule Take 1 capsule (100 mg total) by mouth 2 (two) times daily. 180 capsule 3  . promethazine (PHENERGAN) 25 MG tablet Take 25 mg by mouth every 8 (eight) hours as needed for nausea or vomiting.   2    Psychiatric Specialty Exam:     Blood pressure 134/85, pulse 79, temperature 98.5 F (36.9 C), temperature source Oral, resp. rate 18, SpO2 97 %.There is no weight on file to calculate BMI.  General Appearance: Casual  Eye Contact::  Good  Speech:  Clear and Coherent  Volume:  Normal  Mood:  neutral  Affect:  Congruent  Thought Process:  Coherent, Goal Directed, Linear and Logical  Orientation:  Full (Time, Place, and Person)  Thought Content:  no evidence of delusions  Suicidal Thoughts:  No  Homicidal Thoughts:  No  Memory:  Immediate;   Good Recent;   Good Remote;   Good  Judgement:  Fair  Insight:  Fair  Psychomotor Activity:  Normal  Concentration:  Good  Recall:  Good  Fund of Knowledge:Good  Language: Good  Akathisia:  No  Handed:  Right  AIMS (if indicated):     Assets:  Communication Skills Desire for Improvement Resilience Social Support  Sleep:      Musculoskeletal: Strength & Muscle Tone: within normal limits Gait & Station: normal Patient leans: N/A  Treatment Plan Summary: Daily contact with patient to assess and evaluate symptoms and progress in treatment Medication management  To be transferred to inpatient psychiatric unit for detoxification of alcohol and opioids.   Kennedy Bucker PMHNP-BC 01/22/2014 12:43 PM   Patient Case Reviewed with me as above

## 2014-01-22 NOTE — ED Notes (Signed)
Patient complaining of withdrawal symptoms from alcohol and opioids.  Stated he was taking opioids for back, neck and foot neuropathy.  States he was at [facility] one year ago for same detox. He is complaining of chills, body aches and nausea.  He has visible tremors of the hand.  Patient was placed on the ativan and clonidine protocol.  He denies any SI/HI/AVH.  Patient states he lives with his mother and he is trying to get disability.  Patient has a bed at [facility] and will be transported by [employer].

## 2014-01-22 NOTE — Progress Notes (Signed)
Adult Psychoeducational Group Note  Date:  01/22/2014 Time:  11:49 PM  Group Topic/Focus:  Wrap-Up Group:   The focus of this group is to help patients review their daily goal of treatment and discuss progress on daily workbooks.  Participation Level:  Active  Participation Quality:  Appropriate, Attentive and Sharing  Affect:  Appropriate  Cognitive:  Appropriate  Insight: Appropriate  Engagement in Group:  Engaged  Modes of Intervention:  Support  Additional Comments:  Pt attended group and stated that his goal is to feel better tomorrow and to get some sleep.  Clint Bolder 01/22/2014, 11:49 PM

## 2014-01-22 NOTE — Progress Notes (Signed)
Per Shalita patient was accepted to [facility] 301-1. CSW informed nursing staff of the updated disposition and call report to [phone].    Chesley Noon, MSW, [place] Clinical Social Worker [phone]

## 2014-01-22 NOTE — ED Notes (Addendum)
Pt reports that his last drink was around 2300 last pm. He is requesting detox from alcohol and opiates. He denies SI or HI.

## 2014-01-22 NOTE — ED Provider Notes (Signed)
CSN: [id number]     Arrival date & time 01/22/14  0037 History   First MD Initiated Contact with Patient 01/22/14 0134     Chief Complaint  Patient presents with  . Alcohol Intoxication     (Consider location/radiation/quality/duration/timing/severity/associated sxs/prior Treatment) HPI Comments: 48 year old male who presents with alcohol intoxication stating he drinks because of his chronic pain.  He has been having chronic pain issues since the earlier late 1980s after an on-the-job injury.  He has been getting regular.  Prescription since of Percocet from Dr. Freddrick March.  He states Dr. Maceo Pro has cut him off because  He was overusing/abusing his prescription.  He has not been given a referral to chronic pain management.  He most recently saw cardiology for his chest pain and had a cardiac catheterization and stress test.  Tonight he stating that he is having a burning sensation in his chest that started yesterday and he generally just doesn't feel good.  He has not had any narcotics in 3-4 days  Patient is a 48 y.o. male presenting with intoxication. The history is provided by the patient.  Alcohol Intoxication This is a recurrent problem. The current episode started today. The problem occurs constantly. The problem has been gradually worsening. Associated symptoms include chest pain, myalgias, nausea and vomiting. Pertinent negatives include no fever. The symptoms are aggravated by drinking. He has tried nothing for the symptoms. The treatment provided no relief.    Past Medical History  Diagnosis Date  . Hypertension   . Hyperlipidemia   . 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 ETOH abuse   . GERD (gastroesophageal reflux disease)   . Mental disorder   . Irregular heart beat   . Anxiety   . Shortness of breath   . Headache(784.0)   . Hepatitis   . Withdrawal seizures   . Alcohol withdrawal   . Neuropathy 06/07/2013   Past Surgical History   Procedure Laterality Date  . Back surgery    . Neck surgery    . Fundoplasty transthoracic  2003  . Nasal sinus surgery     Family History  Problem Relation Age of Onset  . Breast cancer Paternal Grandmother   . Lung cancer      was a smoker  . Hypertension Mother    History  Substance Use Topics  . Smoking status: Current Every Day Smoker -- 0.50 packs/day for 30 years    Types: Cigarettes  . Smokeless tobacco: Never Used  . Alcohol Use: 4.2 oz/week    7 Shots of liquor per week     Comment: occasionally     Review of Systems  Constitutional: Positive for activity change. Negative for fever and appetite change.  Respiratory: Negative for shortness of breath.   Cardiovascular: Positive for chest pain. Negative for leg swelling.  Gastrointestinal: Positive for nausea and vomiting.  Musculoskeletal: Positive for myalgias and back pain.      Allergies  Lisinopril  Home Medications   Prior to Admission medications   Medication Sig Start Date End Date Taking? Authorizing Provider  albuterol (PROVENTIL HFA;VENTOLIN HFA) 108 (90 BASE) MCG/ACT inhaler Inhale 2 puffs into the lungs every 4 (four) hours as needed for wheezing or shortness of breath.   Yes Historical Provider, MD  cyclobenzaprine (FLEXERIL) 10 MG tablet Take 10 mg by mouth 3 (three) times daily as needed for muscle spasms.  12/23/13  Yes Historical Provider, MD  FOLIC  ACID PO Take 1 tablet by mouth daily.   Yes Historical Provider, MD  gabapentin (NEURONTIN) 400 MG capsule Take 400 mg by mouth 3 (three) times daily.  12/17/13  Yes Historical Provider, MD  hydrOXYzine (VISTARIL) 25 MG capsule Take 50 mg by mouth at bedtime.  12/23/13  Yes Historical Provider, MD  losartan (COZAAR) 100 MG tablet Take 1 tablet (100 mg total) by mouth daily. 12/21/13  Yes Clent Demark, MD  metoprolol (LOPRESSOR) 50 MG tablet Take 50 mg by mouth 2 (two) times daily.   Yes Historical Provider, MD  pantoprazole (PROTONIX) 40 MG  tablet Take 40 mg by mouth 2 (two) times daily before a meal.  10/22/13  Yes Historical Provider, MD  Thiamine HCl (VITAMIN B-1 PO) Take 1 tablet by mouth daily.   Yes Historical Provider, MD  nicotine (NICODERM CQ - DOSED IN MG/24 HOURS) 14 mg/24hr patch Place 1 patch (14 mg total) onto the skin daily. 12/21/13   Clent Demark, MD  oxyCODONE (OXY IR/ROXICODONE) 5 MG immediate release tablet Take 1 tablet (5 mg total) by mouth every 4 (four) hours as needed for moderate pain. 12/21/13   Clent Demark, MD  pregabalin (LYRICA) 100 MG capsule Take 1 capsule (100 mg total) by mouth 2 (two) times daily. 12/03/13   Tresa Garter, MD   BP 122/104 mmHg  Pulse 95  Temp(Src) 98.9 F (37.2 C) (Oral)  Resp 18  SpO2 92% Physical Exam  Constitutional: He is oriented to person, place, and time. He appears well-developed and well-nourished.  HENT:  Head: Normocephalic.  Eyes: Pupils are equal, round, and reactive to light.  Neck: Normal range of motion.  Cardiovascular: Normal rate and regular rhythm.   Pulmonary/Chest: Effort normal and breath sounds normal.  Abdominal: Soft. Bowel sounds are normal. He exhibits no distension. There is no tenderness.  Musculoskeletal: Normal range of motion.  Neurological: He is alert and oriented to person, place, and time.  Skin: Skin is warm. No rash noted. No pallor.  Nursing note and vitals reviewed.   ED Course  Procedures (including critical care time) Labs Review Labs Reviewed  CBC WITH DIFFERENTIAL - Abnormal; Notable for the following:    WBC 13.2 (*)    RBC 6.17 (*)    Hemoglobin 17.2 (*)    Neutro Abs 9.3 (*)    All other components within normal limits  ETHANOL - Abnormal; Notable for the following:    Alcohol, Ethyl (B) 157 (*)    All other components within normal limits  I-STAT CHEM 8, ED - Abnormal; Notable for the following:    Potassium 2.9 (*)    BUN <3 (*)    Glucose, Bld 117 (*)    Calcium, Ion 1.08 (*)    Hemoglobin 18.0  (*)    HCT 53.0 (*)    All other components within normal limits  I-STAT CHEM 8, ED - Abnormal; Notable for the following:    BUN <3 (*)    Glucose, Bld 124 (*)    All other components within normal limits  TROPONIN I  TROPONIN I    Imaging Review No results found.   Patient had to be awakened from a sound sleep for assessment at which time he states he was having chest pain, chronic pain, generalized myalgias.  He states that he has been drinking because of his pain.  He has not had any narcotic in 3-4 days Patient is now awake and alert, complaining of  chest burning.  He also states he hurt all over and he really, really, really, really needs his pain medicine.  Which his doctor discontinued 4 days ago. Now, stating that he is getting to go into DTs shortly and would like alcohol detox. MDM   Final diagnoses:  None         Garald Balding, NP 01/22/14 2144  Merryl Hacker, MD 01/24/14 [phone]

## 2014-01-22 NOTE — ED Notes (Signed)
Pt presents with c/o alcohol intoxication. Pt reports he started drinking around 4pm today and drank about 8 mikes hard lemonades. Pt reports he did this because he was in so much pain. Pt reports he has a hx of alcohol abuse and says it is because of his pain.

## 2014-01-22 NOTE — Progress Notes (Signed)
D: Pt admitted to [facility] adult unit requesting help for ETOH detox. Per pt he's been drinking for 8 years and drinks  6-8 Mike's hard Lemonade 3-4X/week and a 5th of liquor. Pt appeared anxious and restless at time of assessment. Affect flat but was cooperative with care. Contracts for safety when assessed. Denied A / V hallucinations at this time. Unit orientation done post body search. Unit routines discussed with pt, understanding verbalized.  A: Assessment done, [place] notified of pt's arrival on ward and need for admission orders. R: Pt receptive to care. Off unit to cafeteria for supper with peers, returned without issues. Safety maintained on Q 15 minutes checks as per order for detox / withdrawal symptoms.

## 2014-01-22 NOTE — ED Notes (Signed)
Bed: [facility] Expected date:  Expected time:  Means of arrival:  Comments: Hold for HALLB

## 2014-01-23 ENCOUNTER — Encounter (HOSPITAL_COMMUNITY): Payer: Self-pay | Admitting: Physician Assistant

## 2014-01-23 DIAGNOSIS — I4581 Long QT syndrome: Secondary | ICD-10-CM

## 2014-01-23 DIAGNOSIS — F10939 Alcohol use, unspecified with withdrawal, unspecified: Secondary | ICD-10-CM

## 2014-01-23 DIAGNOSIS — F10230 Alcohol dependence with withdrawal, uncomplicated: Secondary | ICD-10-CM

## 2014-01-23 DIAGNOSIS — F1121 Opioid dependence, in remission: Secondary | ICD-10-CM

## 2014-01-23 DIAGNOSIS — F1994 Other psychoactive substance use, unspecified with psychoactive substance-induced mood disorder: Secondary | ICD-10-CM

## 2014-01-23 DIAGNOSIS — E876 Hypokalemia: Secondary | ICD-10-CM | POA: Insufficient documentation

## 2014-01-23 DIAGNOSIS — R569 Unspecified convulsions: Secondary | ICD-10-CM

## 2014-01-23 DIAGNOSIS — F10239 Alcohol dependence with withdrawal, unspecified: Secondary | ICD-10-CM

## 2014-01-23 HISTORY — DX: Unspecified convulsions: R56.9

## 2014-01-23 HISTORY — DX: Alcohol dependence with withdrawal, unspecified: F10.239

## 2014-01-23 HISTORY — DX: Long QT syndrome: I45.81

## 2014-01-23 HISTORY — DX: Alcohol use, unspecified with withdrawal, unspecified: F10.939

## 2014-01-23 LAB — HEMOGLOBIN A1C
Hgb A1c MFr Bld: 6.1 % — ABNORMAL HIGH (ref <5.7)
Mean Plasma Glucose: 128 mg/dL — ABNORMAL HIGH (ref <117)

## 2014-01-23 MED ORDER — CLONIDINE HCL 0.1 MG PO TABS
0.1000 mg | ORAL_TABLET | Freq: Four times a day (QID) | ORAL | Status: AC
  Administered 2014-01-23 – 2014-01-25 (×10): 0.1 mg via ORAL
  Filled 2014-01-23 (×15): qty 1

## 2014-01-23 MED ORDER — LOSARTAN POTASSIUM 50 MG PO TABS
100.0000 mg | ORAL_TABLET | Freq: Every day | ORAL | Status: DC
  Administered 2014-01-23 – 2014-01-28 (×6): 100 mg via ORAL
  Filled 2014-01-23 (×4): qty 2
  Filled 2014-01-23: qty 28
  Filled 2014-01-23 (×3): qty 2

## 2014-01-23 MED ORDER — NAPROXEN 500 MG PO TABS
500.0000 mg | ORAL_TABLET | Freq: Two times a day (BID) | ORAL | Status: DC | PRN
  Administered 2014-01-23 – 2014-01-27 (×5): 500 mg via ORAL
  Filled 2014-01-23 (×5): qty 1

## 2014-01-23 MED ORDER — QUETIAPINE FUMARATE 25 MG PO TABS
25.0000 mg | ORAL_TABLET | Freq: Four times a day (QID) | ORAL | Status: DC | PRN
  Administered 2014-01-23: 25 mg via ORAL
  Filled 2014-01-23: qty 1

## 2014-01-23 MED ORDER — CYCLOBENZAPRINE HCL 10 MG PO TABS: 10.0000 mg | ORAL_TABLET | Freq: Three times a day (TID) | ORAL | Status: DC | PRN

## 2014-01-23 MED ORDER — TRAZODONE HCL 100 MG PO TABS
200.0000 mg | ORAL_TABLET | Freq: Every evening | ORAL | Status: DC | PRN
  Administered 2014-01-23 (×2): 200 mg via ORAL
  Filled 2014-01-23 (×5): qty 2

## 2014-01-23 MED ORDER — CLONIDINE HCL 0.1 MG PO TABS
0.1000 mg | ORAL_TABLET | ORAL | Status: AC
  Administered 2014-01-26 – 2014-01-27 (×4): 0.1 mg via ORAL
  Filled 2014-01-23 (×5): qty 1

## 2014-01-23 MED ORDER — CLONIDINE HCL 0.1 MG PO TABS
0.1000 mg | ORAL_TABLET | Freq: Every day | ORAL | Status: DC
  Administered 2014-01-28: 0.1 mg via ORAL
  Filled 2014-01-23 (×3): qty 1

## 2014-01-23 MED ORDER — ONDANSETRON 4 MG PO TBDP
4.0000 mg | ORAL_TABLET | Freq: Four times a day (QID) | ORAL | Status: AC | PRN
  Administered 2014-01-24: 4 mg via ORAL
  Filled 2014-01-23: qty 1

## 2014-01-23 MED ORDER — NICOTINE 21 MG/24HR TD PT24
21.0000 mg | MEDICATED_PATCH | Freq: Every day | TRANSDERMAL | Status: DC
  Administered 2014-01-23: 21 mg via TRANSDERMAL
  Filled 2014-01-23 (×4): qty 1

## 2014-01-23 MED ORDER — METHOCARBAMOL 500 MG PO TABS
500.0000 mg | ORAL_TABLET | Freq: Three times a day (TID) | ORAL | Status: AC | PRN
  Administered 2014-01-23 – 2014-01-27 (×7): 500 mg via ORAL
  Filled 2014-01-23 (×7): qty 1

## 2014-01-23 MED ORDER — GABAPENTIN 400 MG PO CAPS
400.0000 mg | ORAL_CAPSULE | Freq: Three times a day (TID) | ORAL | Status: DC
  Administered 2014-01-23 – 2014-01-25 (×6): 400 mg via ORAL
  Filled 2014-01-23 (×10): qty 1

## 2014-01-23 MED ORDER — NICOTINE 14 MG/24HR TD PT24
14.0000 mg | MEDICATED_PATCH | Freq: Every day | TRANSDERMAL | Status: DC
  Administered 2014-01-24 – 2014-01-28 (×6): 14 mg via TRANSDERMAL
  Filled 2014-01-23 (×5): qty 1
  Filled 2014-01-23: qty 14
  Filled 2014-01-23 (×2): qty 1

## 2014-01-23 MED ORDER — METOPROLOL TARTRATE 50 MG PO TABS
50.0000 mg | ORAL_TABLET | Freq: Two times a day (BID) | ORAL | Status: DC
  Administered 2014-01-23 – 2014-01-28 (×10): 50 mg via ORAL
  Filled 2014-01-23 (×3): qty 1
  Filled 2014-01-23: qty 28
  Filled 2014-01-23 (×9): qty 1
  Filled 2014-01-23: qty 28

## 2014-01-23 MED ORDER — HYDROXYZINE HCL 25 MG PO TABS
25.0000 mg | ORAL_TABLET | Freq: Four times a day (QID) | ORAL | Status: DC | PRN
  Administered 2014-01-23 – 2014-01-24 (×4): 25 mg via ORAL
  Filled 2014-01-23 (×4): qty 1

## 2014-01-23 MED ORDER — LOPERAMIDE HCL 2 MG PO CAPS: 2.0000 mg | ORAL_CAPSULE | ORAL | Status: AC | PRN

## 2014-01-23 MED ORDER — DICYCLOMINE HCL 20 MG PO TABS: 20.0000 mg | ORAL_TABLET | Freq: Four times a day (QID) | ORAL | Status: AC | PRN

## 2014-01-23 MED ORDER — PANTOPRAZOLE SODIUM 40 MG PO TBEC
40.0000 mg | DELAYED_RELEASE_TABLET | Freq: Two times a day (BID) | ORAL | Status: DC
  Administered 2014-01-23 – 2014-01-28 (×10): 40 mg via ORAL
  Filled 2014-01-23: qty 28
  Filled 2014-01-23 (×13): qty 1
  Filled 2014-01-23: qty 28

## 2014-01-23 NOTE — [facility] Suicide Risk Assessment (Signed)
   Nursing information obtained from:   pt  Demographic factors:   male,  Current Mental Status:    Loss Factors:   decline in physical health Historical Factors:   n/a Risk Reduction Factors:    Sense of responsibility to family, Living with another person, especially a relative and Positive social support Total Time spent with patient: 45 minutes  CLINICAL FACTORS:   Depression:   Anhedonia Comorbid alcohol abuse/dependence Hopelessness Insomnia Alcohol/Substance Abuse/Dependencies Chronic Pain  Psychiatric Specialty Exam: Physical Exam  ROS  Blood pressure 123/100, pulse 131, temperature 98.9 F (37.2 C), temperature source Oral, resp. rate 18, height 6\' 4"  (1.93 m), weight 90.946 kg (200 lb 8 oz), SpO2 98 %.Body mass index is 24.42 kg/(m^2).  General Appearance: Casual  Eye Contact::  Fair  Speech:  Clear and Coherent and Normal Rate  Volume:  Normal  Mood:  Anxious and Depressed  Affect:  Congruent  Thought Process:  Goal Directed, Linear and Logical  Orientation:  Full (Time, Place, and Person)  Thought Content:  Negative  Suicidal Thoughts:  No  Homicidal Thoughts:  No  Memory:  Immediate;   Fair Recent;   Fair Remote;   Fair  Judgement:  Fair  Insight:  Fair  Psychomotor Activity:  Normal  Concentration:  Fair  Recall:  [employer] of Knowledge:Fair  Language: Fair  Akathisia:  No  Handed:  Right  AIMS (if indicated):     Assets:  Communication Skills Desire for Improvement  Sleep:  Number of Hours: 3   Musculoskeletal: Strength & Muscle Tone: within normal limits Gait & Station: normal Patient leans: N/A  COGNITIVE FEATURES THAT CONTRIBUTE TO RISK:  Closed-mindedness Loss of executive function Polarized thinking Thought constriction (tunnel vision)    SUICIDE RISK:   Minimal: No identifiable suicidal ideation.  Patients presenting with no risk factors but with morbid ruminations; may be classified as minimal risk based on the severity of the  depressive symptoms  PLAN OF CARE:  I certify that inpatient services furnished can reasonably be expected to improve the patient's condition.  Charlcie Cradle 01/23/2014, 8:37 AM

## 2014-01-23 NOTE — [facility] Group Notes (Signed)
[place] LCSW Group Therapy  01/23/2014   10:00 AM   Type of Therapy:  Group Therapy  Participation Level:  Active  Participation Quality:  Appropriate and Attentive  Affect:  Appropriate, Depressed, Flat  Cognitive:  Alert and Appropriate  Insight:  Developing/Improving and Engaged  Engagement in Therapy:  Developing/Improving and Engaged  Modes of Intervention:  Clarification, Confrontation, Discussion, Education, Exploration, Limit-setting, Orientation, Problem-solving, Rapport Building, Art therapist, Socialization and Support  Summary of Progress/Problems: Today's group topic was avoiding self sabotage and enabling behaviors. Group members were asked to define self sabotage and enabling and provide examples. Group members were then asked to discuss unhealthy relationships and how to have positive healthy boundaries with those that enable. Group members were asked to process how communicating needs and establishing a plan to change the above identified behavior.  Pt was quiet through group but appeared to actively listen to group discussion.  Pt also related to another pt in regards to chronic pain.    Regan Lemming, LCSW 01/23/2014 10:46 AM

## 2014-01-23 NOTE — [facility] Counselor (Signed)
Adult Comprehensive Assessment  Patient ID: Mark [name], male   DOB: [birth year], 48 y.o.   MRN: [id number]  Information Source: Information source: Patient  Current Stressors:  Educational / Learning stressors: NA Employment / Job issues: Unemployed; in the process of applying for disability Family Relationships: Some strain with Step father as the two do not speak although they live in the same home Financial / Lack of resources (include bankruptcy): Strained Housing / Lack of housing: Lives with parents Physical health (include injuries & life threatening diseases): Chronic foot pain he reports is not diabetic yet related to alcohol use Social relationships: Isolates except from family Substance abuse: Ongoing alcohol use Bereavement / Loss: NA  Living/Environment/Situation:  Living Arrangements: Parent Living conditions (as described by patient or guardian): Good supportive home How long has patient lived in current situation?: 3 years What is atmosphere in current home: Comfortable, Supportive, Loving  Family History:  Marital status: Separated Separated, when?: 2011 What types of issues is patient dealing with in the relationship?: Patient's drinking Additional relationship information: She is supportive if I don't drink Does patient have children?: Yes How many children?: 2 How is patient's relationship with their children?: Good with two daughters  Childhood History:  By whom was/is the patient raised?: Both parents Additional childhood history information: Father died in his early 9's due to alcohol issues Description of patient's relationship with caregiver when they were a child: Good with both Patient's description of current relationship with people who raised him/her: Good with mom Does patient have siblings?: Yes Number of Siblings: 1 Description of patient's current relationship with siblings: Distant with sister Did patient suffer any  verbal/emotional/physical/sexual abuse as a child?: No Did patient suffer from severe childhood neglect?: No Has patient ever been sexually abused/assaulted/raped as an adolescent or adult?: No Was the patient ever a victim of a crime or a disaster?: No Witnessed domestic violence?: No Has patient been effected by domestic violence as an adult?: No  Education:  Highest grade of school patient has completed: 61 Currently a Ship broker?: No Learning disability?: No  Employment/Work Situation:   Employment situation: Unemployed (In process of filing for disability for second time) Patient's job has been impacted by current illness: No What is the longest time patient has a held a job?: 10 years; Self employed Development worker, 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 Pt reports the last several weeks he has been consuming increased amounts of alcohol (2 bottles of wine a day, a 1/2 to whole fifth, hard lemonades or liquor in excess)  Pt also takes Xanax 2 x daily Alcohol/Substance Abuse Treatment Hx: Past detox If yes, describe treatment: Detox 01/2014 Has alcohol/substance abuse ever caused legal problems?: No  Social Support System:   Patient's Community Support System: Fair Dietitian Support System: Mom and ex wife (only separated) and 2 daughters Type of faith/religion: Darrick Meigs How does patient's faith help to cope with current illness?: Prayer helps  in  addition to occasional church attendance   Leisure/Recreation:   Leisure and Hobbies: Caring for puppy  Strengths/Needs:   What things does the patient do well?: Good cook In what areas does patient struggle / problems for patient: Alcohol and physical pain  Discharge Plan:   Does patient have access to transportation?: Yes Will patient be returning to same living situation after discharge?: Yes Currently receiving community mental health services: No If no, would patient like referral for services when discharged?: Yes (What county?) ([place]; Perhaps referral to [employer] as patient did not do well at [employer]) Does patient have financial barriers related to discharge medications?: Yes Patient description of barriers related to discharge medications: Difficulty paying for medication if not covered at [facility] and [place]  Summary/Recommendations:   Summary and Recommendations (to be completed by the evaluator): Patient is 48 YO separated unemployed caucasian male admitted with diagnosis of Substance Induced Mood Disorder, Alcohol Dependence Severe and Opoid Dependence.  Patient would benefit from crisis stabilization, medication evaluation, therapy groups for processing thoughts/feelings/experiences, psycho ed groups for increasing coping skills, and aftercare planning. Discharge Process and Patient Expectations information sheet signed by patient, witnessed by writer and inserted in patient's shadow chart.   Lyla Glassing. 01/23/2014

## 2014-01-23 NOTE — H&P (Signed)
Psychiatric Admission Assessment Adult  Patient Identification:  RACHAEL ZAPANTA Date of Evaluation:  01/23/2014 Chief Complaint:  ALCOHOL DEPENDENCE History of Present Illness:  Ryszard Socarras is a 48 year old separated WM who presented to the ED requesting assistance with alcohol withdrawal. He began drinking daily 2 months ago to help with his withdrawal symptoms from opiates.  He had been taking opiate pain medication for chronic joint and chest pain until he started abusing it. When he ran out of medication early he started drinking to ease the withdrawal symptoms. He also sought out narcotic pain medication from another prescriber thus voiding his pain control contract with his PCP who had been writing his narcotic prescriptions.        He notes that he has been drinking liquor dailey from 1/5th to 1/2 gallon a day for the past two months. He has a history of withdrawal seizures per the patient and reports several.  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       His current symptoms include weight loss, chills, seeing spots, chronic chest and joint pain, nausea, vomiting and diarrhea, myalgia, has a sensation of his skin crawling, paraesthesias in fingers, feet and legs. Elements:  Location:  Adult unit [place]. Quality:  acute. Severity:  severe. Timing:  on going worsening over the past several days.. Duration:  months. Context:  worsening health problems. Associated Signs/Synptoms: Depression Symptoms:  depressed mood, (Hypo) Manic Symptoms:  denies Anxiety Symptoms:  Chronic worry Psychotic Symptoms:  none PTSD Symptoms:None Total Time spent with patient: 30 minutes  Psychiatric Specialty Exam: Physical Exam  Constitutional: He appears well-developed and well-nourished.  Psychiatric: His speech is normal and behavior is normal. Judgment and  thought content normal. His mood appears anxious. Cognition and memory are normal. He exhibits a depressed mood.  Patient is seen and the chart is reviewed. I agree with the findings of the exam completed in the ED.    Review of Systems  Constitutional: Positive for chills, weight loss, malaise/fatigue and diaphoresis.  HENT: Negative.   Eyes: Positive for blurred vision.  Respiratory: Negative.   Cardiovascular: Positive for chest pain.  Gastrointestinal: Positive for nausea, vomiting and diarrhea.  Genitourinary: Negative.   Musculoskeletal: Positive for myalgias, back pain and joint pain.  Neurological: Positive for tingling, tremors, sensory change and seizures.  Endo/Heme/Allergies: Negative.   Psychiatric/Behavioral: Positive for depression and substance abuse. Negative for suicidal ideas and hallucinations. The patient is nervous/anxious.     Blood pressure 123/100, pulse 131, temperature 98.9 F (37.2 C), temperature source Oral, resp. rate 18, height '6\' 4"'  (1.93 m), weight 90.946 kg (200 lb 8 oz), SpO2 98 %.Body mass index is 24.42 kg/(m^2).  General Appearance: Casual  Eye Contact::  Good  Speech:  Clear and Coherent  Volume:  Normal  Mood:  Anxious and Euthymic  Affect:  Congruent  Thought Process:  Goal Directed  Orientation:  Full (Time, Place, and Person)  Thought Content:  WDL  Suicidal Thoughts:  No  Homicidal Thoughts:  No  Memory:  Negative  Judgement:  Intact  Insight:  Present  Psychomotor Activity:  Restlessness and Tremor  Concentration:  Fair  Recall:  NA  Fund of Knowledge:Good  Language: Good  Akathisia:  No  Handed:  Right  AIMS (if indicated):     Assets:  Communication Skills  Desire for Improvement  Sleep:  Number of Hours: 3    Musculoskeletal: Strength & Muscle Tone: within normal limits Gait & Station: normal Patient leans: N/A  Past Psychiatric History: Diagnosis:  denies  Hospitalizations:  [facility] 2014, [facility] 2011  Outpatient Care:   Dr. Georga Bora  Substance Abuse Care:  none  Self-Mutilation:  denies  Suicidal Attempts:   denies  Violent Behaviors:     denies   Past Medical History:   Past Medical History  Diagnosis Date  . Hypertension   . Hyperlipidemia   . 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 ETOH abuse   . GERD (gastroesophageal reflux disease)   . Mental disorder   . Irregular heart beat   . Anxiety   . Shortness of breath   . Headache(784.0)   . Hepatitis   . Withdrawal seizures   . Alcohol withdrawal   . Neuropathy 06/07/2013   Seizure History:  with alcohol withdrawal Allergies:   Allergies  Allergen Reactions  . Lisinopril Anaphylaxis and Swelling    Per Dr. Terrence Dupont (12/20/13), patient can tolerate losartan.   PTA Medications: Prescriptions prior to admission  Medication Sig Dispense Refill Last Dose  . albuterol (PROVENTIL HFA;VENTOLIN HFA) 108 (90 BASE) MCG/ACT inhaler Inhale 2 puffs into the lungs every 4 (four) hours as needed for wheezing or shortness of breath.   Past Month at Unknown time  . cyclobenzaprine (FLEXERIL) 10 MG tablet Take 10 mg by mouth 3 (three) times daily as needed for muscle spasms.   2 Past Week at Unknown time  . FOLIC ACID PO Take 1 tablet by mouth daily.   01/21/2014 at Unknown time  . gabapentin (NEURONTIN) 400 MG capsule Take 400 mg by mouth 3 (three) times daily.   5 01/21/2014 at Unknown time  . hydrOXYzine (VISTARIL) 25 MG capsule Take 50 mg by mouth at bedtime.   2 01/21/2014 at Unknown time  . losartan (COZAAR) 100 MG tablet Take 1 tablet (100 mg total) by mouth daily. 30 tablet 3 01/21/2014 at Unknown time  . metoprolol (LOPRESSOR) 50 MG tablet Take 50 mg by mouth 2 (two) times daily.   01/21/2014 at 0800  . nicotine (NICODERM CQ - DOSED IN MG/24 HOURS) 14 mg/24hr patch Place 1 patch (14 mg total) onto the skin daily. 28 patch 0   . oxyCODONE (OXY IR/ROXICODONE) 5 MG immediate release tablet Take 1 tablet (5 mg total)  by mouth every 4 (four) hours as needed for moderate pain. 30 tablet 0   . pantoprazole (PROTONIX) 40 MG tablet Take 40 mg by mouth 2 (two) times daily before a meal.   5 01/21/2014 at Unknown time  . pregabalin (LYRICA) 100 MG capsule Take 1 capsule (100 mg total) by mouth 2 (two) times daily. 180 capsule 3 not started  . Thiamine HCl (VITAMIN B-1 PO) Take 1 tablet by mouth daily.   01/21/2014 at Unknown time    Previous Psychotropic Medications:  Medication/Dose                 Substance Abuse History in the last 12 months:  Yes.    Consequences of Substance Abuse: Medical Consequences:  worsening of physical health DT's: Withdrawal Symptoms:   Cramps Diaphoresis Diarrhea Headaches Nausea Tremors Vomiting  Social History:  reports that he has been smoking Cigarettes.  He has a 15 pack-year smoking history. He has never used smokeless tobacco. He reports that  he drinks about 4.2 oz of alcohol per week. He reports that he uses illicit drugs (Oxycodone). Additional Social History: Current Place of Residence:   Place of Birth:   Family Members: Marital Status:  Separated Children:  Sons:  Daughters: Relationships: Education:  [employer] Problems/Performance: Religious Beliefs/Practices: History of Abuse (Emotional/Phsycial/Sexual) Ship broker History:  None. Legal History: Hobbies/Interests:  Family History:   Family History  Problem Relation Age of Onset  . Breast cancer Paternal Grandmother   . Lung cancer      was a smoker  . Hypertension Mother     Results for orders placed or performed during the hospital encounter of 01/22/14 (from the past 72 hour(s))  Comprehensive metabolic panel     Status: Abnormal   Collection Time: 01/22/14  7:30 PM  Result Value Ref Range   Sodium 138 137 - 147 mEq/L   Potassium 3.9 3.7 - 5.3 mEq/L   Chloride 98 96 - 112 mEq/L   CO2 25 19 - 32 mEq/L   Glucose, Bld 124 (H) 70 - 99 mg/dL    BUN 5 (L) 6 - 23 mg/dL   Creatinine, Ser 0.72 0.50 - 1.35 mg/dL   Calcium 9.5 8.4 - 10.5 mg/dL   Total Protein 6.6 6.0 - 8.3 g/dL   Albumin 3.5 3.5 - 5.2 g/dL   AST 22 0 - 37 U/L   ALT 27 0 - 53 U/L   Alkaline Phosphatase 100 39 - 117 U/L   Total Bilirubin 0.5 0.3 - 1.2 mg/dL   GFR calc non Af Amer >90 >90 mL/min   GFR calc Af Amer >90 >90 mL/min    Comment: (NOTE) The eGFR has been calculated using the CKD EPI equation. This calculation has not been validated in all clinical situations. eGFR's persistently <90 mL/min signify possible Chronic Kidney Disease.    Anion gap 15 5 - 15    Comment: Performed at [facility]  Hemoglobin A1c     Status: Abnormal   Collection Time: 01/22/14  7:30 PM  Result Value Ref Range   Hgb A1c MFr Bld 6.1 (H) <5.7 %    Comment: (NOTE)                                                                       According to the ADA Clinical Practice Recommendations for 2011, when HbA1c is used as a screening test:  >=6.5%   Diagnostic of Diabetes Mellitus           (if abnormal result is confirmed) 5.7-6.4%   Increased risk of developing Diabetes Mellitus References:Diagnosis and Classification of Diabetes Mellitus,Diabetes ZDGU,4403,47(QQVZD 1):S62-S69 and Standards of Medical Care in         Diabetes - 2011,Diabetes Care,2011,34 (Suppl 1):S11-S61.    Mean Plasma Glucose 128 (H) <117 mg/dL    Comment: Performed at [facility]  TSH     Status: None   Collection Time: 01/22/14  7:30 PM  Result Value Ref Range   TSH 0.581 0.350 - 4.500 uIU/mL    Comment: Performed at [facility]   Psychological Evaluations:  Assessment:     Complicated Alcohol withdrawal with hx of withdrawal seizures per patient, Opiate dependence in  early remission, with abnormal EKG, hx of Cardiomyopathy, PVTs and currently abnormal EKG, Abnormal HgbA1c,BUN, Glucose, BAL 157, UDS+ Benzos.  DSM5:  Schizophrenia Disorders:    Obsessive-Compulsive Disorders:   Trauma-Stressor Disorders:   Substance/Addictive Disorders:  Substance induced mood disorder, Alcohol dependence in early withdrawal, opiate dependence early remission Depressive Disorders:  Substance induced mood disorder  AXIS I:  SIMDO, Alcohol dependence early withdrawal, Opiate dependence early remission. AXIS II:   AXIS III:  Abnormal EKG, Abnormal Labs. Past Medical History  Diagnosis Date  . Hypertension   . Hyperlipidemia   . 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 ETOH abuse   . GERD (gastroesophageal reflux disease)   . Mental disorder   . Irregular heart beat   . Anxiety   . Shortness of breath   . Headache(784.0)   . Hepatitis   . Withdrawal seizures   . Alcohol withdrawal   . Neuropathy 06/07/2013   AXIS IV:  problems related to social environment AXIS V:  41-50 serious symptoms  Treatment Plan/Recommendations:  1. Admit for crisis management and stabilization. 2. Medication management to reduce current symptoms to base line and improve the patient's overall level of functioning. 3. Treat health problems as indicated. 4. Develop treatment plan to decrease risk of relapse upon discharge and to reduce the need for readmission. 5. Psycho-social education regarding relapse prevention and self care. 6. Health care follow up as needed for medical problems. 7. Restart home medications where appropriate.   Treatment Plan Summary: Daily contact with patient to assess and evaluate symptoms and progress in treatment Medication management Current Medications:  Current Facility-Administered Medications  Medication Dose Route Frequency Provider Last Rate Last Dose  . acetaminophen (TYLENOL) tablet 650 mg  650 mg Oral Q6H PRN Charlcie Cradle, MD   650 mg at 01/23/14 0147  . alum & mag hydroxide-simeth (MAALOX/MYLANTA) 200-200-20 MG/5ML suspension 30 mL  30 mL Oral Q4H PRN Charlcie Cradle, MD      .  cyclobenzaprine (FLEXERIL) tablet 10 mg  10 mg Oral TID PRN Charlcie Cradle, MD      . hydrOXYzine (ATARAX/VISTARIL) tablet 25 mg  25 mg Oral Q6H PRN Charlcie Cradle, MD   25 mg at 01/23/14 0147  . loperamide (IMODIUM) capsule 2-4 mg  2-4 mg Oral PRN Charlcie Cradle, MD      . LORazepam (ATIVAN) tablet 1 mg  1 mg Oral Q6H PRN Charlcie Cradle, MD   1 mg at 01/23/14 0604  . LORazepam (ATIVAN) tablet 1 mg  1 mg Oral QID Charlcie Cradle, MD   1 mg at 01/22/14 2210   Followed by  . [START ON 01/24/2014] LORazepam (ATIVAN) tablet 1 mg  1 mg Oral TID Charlcie Cradle, MD       Followed by  . [START ON 01/25/2014] LORazepam (ATIVAN) tablet 1 mg  1 mg Oral BID Charlcie Cradle, MD      . magnesium hydroxide (MILK OF MAGNESIA) suspension 30 mL  30 mL Oral Daily PRN Charlcie Cradle, MD      . multivitamin with minerals tablet 1 tablet  1 tablet Oral Daily Charlcie Cradle, MD   1 tablet at 01/23/14 1007  . nicotine (NICODERM CQ - dosed in mg/24 hours) patch 21 mg  21 mg Transdermal Daily Nicholaus Bloom, MD   21 mg at 01/23/14 1007  . ondansetron (ZOFRAN-ODT) disintegrating tablet 4 mg  4 mg Oral Q6H PRN Charlcie Cradle, MD  4 mg at 01/23/14 0637  . QUEtiapine (SEROQUEL) tablet 25 mg  25 mg Oral QID PRN Charlcie Cradle, MD      . thiamine (B-1) injection 100 mg  100 mg Intramuscular Once Charlcie Cradle, MD      . thiamine (VITAMIN B-1) tablet 100 mg  100 mg Oral Daily Charlcie Cradle, MD   100 mg at 01/23/14 0800  . traZODone (DESYREL) tablet 200 mg  200 mg Oral QHS,MR X 1 Charlcie Cradle, MD        Observation Level/Precautions:  Detox  Laboratory:  Patient will need daily Mg and K+ drawn due to Prolonged QT  Psychotherapy:   groups  Medications:    Will D/C all medications that will increase QT per IM Hospitalist to include, Phenergan, Flexeril, Seroquel. May use Clonidine for sx of opiate withdrawal if needed. Will monitor closely for seizures with CIWAs and COWs Q 4 hrs x 24, while awake, then Q8 hours, while awake  then to protocol.  Consultations:   I spoke with IM Hospitalist who reviewed recent EKG, ECHO and recent cardiac cath.  Discharge Concerns:  Patitent will need to be re-established with PCP and follow with cardiology.  Estimated LOS:  5-7 days.  Other:     I certify that inpatient services furnished can reasonably be expected to improve the patient's condition.   Marlane Hatcher. Chesney Klimaszewski RPAC 11:03 AM 01/23/2014

## 2014-01-23 NOTE — Progress Notes (Signed)
D Pt. Denies SI and HI, continues to complain of knee pain.  A Writer offered support and encouragement, discussed patients anxiety and medications.  R Pt. Continues to request seroquel although the medications have been explained and reason for changing them.  Pt. Was given robaxin, vistaril, and naproxen this pm.  Still rates his anxiety high. Pt. Remains safe on the unit.

## 2014-01-23 NOTE — Progress Notes (Signed)
Received called from Nena Polio, PA-C given abnormal findings on EKG in a patient with hx of alcohol abuse and active withdrawal. Patient with EKG demonstrating prolonged QT (not exactly new; but with concerns given meds he is on currently); no acute ischemic changes appreciated. Most recent 2-D echo without signs of systolic or diastolic abnormalities (EF 55%) and abnormal potassium level. No CP, SOB and no swelling of his LE extremities reported.  Plan: -avoid agents that can increase QT (seroquel, flexeril, phenergan) -replete and follow electrolytes closely (especially potassium and magnesium) -continue use of metoprolol and cozaar for his BP -ok to use clonidine protocol for opiates withdrawal (just watch BP) -replete and follow electrolytes closely (especially potassium and magnesium) -treat/ follow detox protocol for alcohol abuse/withdrawal -call us with any further concerns  Barton Dubois [phone]

## 2014-01-23 NOTE — Progress Notes (Signed)
Pt states slept poorly last night.  Rates depression 7 and anxiety 8-9 today. Scheduled and prn meds given.  Will continue to monitor.

## 2014-01-23 NOTE — Progress Notes (Signed)
Pt did not attend the [place] speaker meeting tonight. Pt was in his bed sleeping.

## 2014-01-24 DIAGNOSIS — F1120 Opioid dependence, uncomplicated: Secondary | ICD-10-CM

## 2014-01-24 LAB — MAGNESIUM: Magnesium: 2.2 mg/dL (ref 1.5–2.5)

## 2014-01-24 LAB — POTASSIUM: POTASSIUM: 3.8 meq/L (ref 3.7–5.3)

## 2014-01-24 MED ORDER — MIRTAZAPINE 7.5 MG PO TABS
7.5000 mg | ORAL_TABLET | Freq: Every day | ORAL | Status: DC
  Administered 2014-01-24: 7.5 mg via ORAL
  Filled 2014-01-24 (×2): qty 1

## 2014-01-24 MED ORDER — ENSURE COMPLETE PO LIQD
237.0000 mL | Freq: Three times a day (TID) | ORAL | Status: DC
  Administered 2014-01-24 – 2014-01-28 (×8): 237 mL via ORAL

## 2014-01-24 MED ORDER — HYDROXYZINE HCL 50 MG PO TABS
50.0000 mg | ORAL_TABLET | Freq: Three times a day (TID) | ORAL | Status: DC | PRN
  Administered 2014-01-25 – 2014-01-27 (×6): 50 mg via ORAL
  Filled 2014-01-24: qty 1
  Filled 2014-01-24: qty 20
  Filled 2014-01-24 (×5): qty 1

## 2014-01-24 NOTE — [facility] Group Notes (Signed)
[facility] LCSW Group Therapy 01/24/2014  1:15 PM   Type of Therapy: Group Therapy  Participation Level: Did Not Attend. Patient in bed.   Tilden Fossa, MSW, [place] Worker [facility] [phone]

## 2014-01-24 NOTE — [facility] Group Notes (Signed)
[facility] LCSW Aftercare Discharge Planning Group Note   01/24/2014 10:09 AM    Participation Quality:  Appropraite  Mood/Affect:  Appropriate  Depression Rating:  7  Anxiety Rating:  8/9  Thoughts of Suicide:  No  Will you contract for safety?   NA  Current AVH:  No  Plan for Discharge/Comments:  Patient attended discharge planning group and actively participated in group. He advised of admitting to hospital due to abusing alcohol and opiods.  He is a resident of [facility] and is interested in residential treatment.  Suicide prevention education reviewed and SPE document provided.   Transportation Means: Patient uses public transportation.   Supports:  Patient has a limited support system.   Keysean Savino, Eulas Post

## 2014-01-24 NOTE — Progress Notes (Signed)
[facility] MD Progress Note  01/24/2014 11:06 AM Mark [name]  MRN:  [id number] Subjective: Patient describes ongoing anxiety, and feels that current medications are not adequately addressing anxiety symptoms. Objective: I have discussed case with treatment team and have met with patient. Patient is a 48 year old male who was admitted on  11/15. He has a history of alcohol dependence and a history of chronic pain , for which he was using opiates. Of note, he has a cardiac history, and EKG was remarkable for  Prolonged QTc. Today I have repeated an EKG , which is improved and no longer showing QTc prolongation ( repeat 477)   Patient presents mildly irritable, but calm. Does not appear to be in any acute distress or discomfort. Describes significant subjective sense of anxiety. He has been visible in milieu. He has mild distal tremors, but no diaphoresis. He was tachycardic this AM but repeat pulse was 76.  K(+) and Mg(++) within normal limits.  Diagnosis:  Alcohol Dependence, Alcohol Withdrawal, Opiate Dependence  Total Time spent with patient: 25 minutes    ADL's:  Fair   Sleep: Fair  Appetite: Good  Suicidal Ideation:  Currently denies any suicidal ideations  Homicidal Ideation:  Currently denies any homicidal ideations AEB (as evidenced by):  Psychiatric Specialty Exam: Physical Exam  ROS  Blood pressure 122/85, pulse 122, temperature 99.1 F (37.3 C), temperature source Oral, resp. rate 18, height '6\' 4"'  (1.93 m), weight 90.946 kg (200 lb 8 oz), SpO2 98 %.Body mass index is 24.42 kg/(m^2).  General Appearance: Fairly Groomed  Engineer, water::  Good  Speech:  Normal Rate  Volume:  Normal  Mood:  Dysphoric and Irritable  Affect:  Congruent  Thought Process:  Goal Directed and Linear  Orientation:  Full (Time, Place, and Person)  Thought Content:  no hallucinations and no delusions  Suicidal Thoughts:  No at this time denies any plan or intention of hurting himself and  contracts for safety on the unit.  Homicidal Thoughts:  No  Memory:  recent and remote grossly intact   Judgement:  Fair  Insight:  Fair  Psychomotor Activity:  Normal  Concentration:  Good  Recall:  Good  Fund of Knowledge:Good  Language: Good  Akathisia:  Negative  Handed:  Right  AIMS (if indicated):     Assets:  Communication Skills Desire for Improvement Resilience  Sleep:  Number of Hours: 5   Musculoskeletal: Strength & Muscle Tone: within normal limits- no diaphoresis and minimal distal tremors noted. Gait & Station: normal Patient leans: N/A  Current Medications: Current Facility-Administered Medications  Medication Dose Route Frequency Provider Last Rate Last Dose  . acetaminophen (TYLENOL) tablet 650 mg  650 mg Oral Q6H PRN Charlcie Cradle, MD   650 mg at 01/23/14 0147  . alum & mag hydroxide-simeth (MAALOX/MYLANTA) 200-200-20 MG/5ML suspension 30 mL  30 mL Oral Q4H PRN Charlcie Cradle, MD      . cloNIDine (CATAPRES) tablet 0.1 mg  0.1 mg Oral QID Nena Polio, PA-C   0.1 mg at 01/24/14 0747   Followed by  . [START ON 01/26/2014] cloNIDine (CATAPRES) tablet 0.1 mg  0.1 mg Oral BH-qamhs Nena Polio, PA-C       Followed by  . [START ON 01/28/2014] cloNIDine (CATAPRES) tablet 0.1 mg  0.1 mg Oral QAC breakfast Nena Polio, PA-C      . dicyclomine (BENTYL) tablet 20 mg  20 mg Oral Q6H PRN Nena Polio, PA-C      .  gabapentin (NEURONTIN) capsule 400 mg  400 mg Oral TID Nena Polio, PA-C   400 mg at 01/24/14 0747  . hydrOXYzine (ATARAX/VISTARIL) tablet 25 mg  25 mg Oral Q6H PRN Nena Polio, PA-C   25 mg at 01/24/14 0641  . loperamide (IMODIUM) capsule 2-4 mg  2-4 mg Oral PRN Nena Polio, PA-C      . LORazepam (ATIVAN) tablet 1 mg  1 mg Oral Q6H PRN Charlcie Cradle, MD   1 mg at 01/24/14 0152  . LORazepam (ATIVAN) tablet 1 mg  1 mg Oral TID Charlcie Cradle, MD   1 mg at 01/24/14 0749   Followed by  . [START ON 01/25/2014] LORazepam (ATIVAN) tablet 1 mg  1 mg Oral  BID Charlcie Cradle, MD      . losartan (COZAAR) tablet 100 mg  100 mg Oral Daily Nena Polio, PA-C   100 mg at 01/24/14 0749  . magnesium hydroxide (MILK OF MAGNESIA) suspension 30 mL  30 mL Oral Daily PRN Charlcie Cradle, MD      . methocarbamol (ROBAXIN) tablet 500 mg  500 mg Oral Q8H PRN Nena Polio, PA-C   500 mg at 01/23/14 1817  . metoprolol (LOPRESSOR) tablet 50 mg  50 mg Oral BID Nena Polio, PA-C   50 mg at 01/24/14 0747  . multivitamin with minerals tablet 1 tablet  1 tablet Oral Daily Charlcie Cradle, MD   1 tablet at 01/24/14 0749  . naproxen (NAPROSYN) tablet 500 mg  500 mg Oral BID PRN Nena Polio, PA-C   500 mg at 01/23/14 2136  . nicotine (NICODERM CQ - dosed in mg/24 hours) patch 14 mg  14 mg Transdermal Daily Nena Polio, PA-C   14 mg at 01/24/14 0820  . ondansetron (ZOFRAN-ODT) disintegrating tablet 4 mg  4 mg Oral Q6H PRN Nena Polio, PA-C   4 mg at 01/24/14 0641  . pantoprazole (PROTONIX) EC tablet 40 mg  40 mg Oral BID AC Neil Mashburn, PA-C   40 mg at 01/24/14 0641  . thiamine (B-1) injection 100 mg  100 mg Intramuscular Once Charlcie Cradle, MD      . thiamine (VITAMIN B-1) tablet 100 mg  100 mg Oral Daily Charlcie Cradle, MD   100 mg at 01/24/14 0749  . traZODone (DESYREL) tablet 200 mg  200 mg Oral QHS,MR X 1 Charlcie Cradle, MD   200 mg at 01/23/14 2347    Lab Results:  Results for orders placed or performed during the hospital encounter of 01/22/14 (from the past 48 hour(s))  Comprehensive metabolic panel     Status: Abnormal   Collection Time: 01/22/14  7:30 PM  Result Value Ref Range   Sodium 138 137 - 147 mEq/L   Potassium 3.9 3.7 - 5.3 mEq/L   Chloride 98 96 - 112 mEq/L   CO2 25 19 - 32 mEq/L   Glucose, Bld 124 (H) 70 - 99 mg/dL   BUN 5 (L) 6 - 23 mg/dL   Creatinine, Ser 0.72 0.50 - 1.35 mg/dL   Calcium 9.5 8.4 - 10.5 mg/dL   Total Protein 6.6 6.0 - 8.3 g/dL   Albumin 3.5 3.5 - 5.2 g/dL   AST 22 0 - 37 U/L   ALT 27 0 - 53 U/L   Alkaline  Phosphatase 100 39 - 117 U/L   Total Bilirubin 0.5 0.3 - 1.2 mg/dL   GFR calc non Af Amer >90 >90 mL/min   GFR calc Af Amer >90 >90 mL/min  Comment: (NOTE) The eGFR has been calculated using the CKD EPI equation. This calculation has not been validated in all clinical situations. eGFR's persistently <90 mL/min signify possible Chronic Kidney Disease.    Anion gap 15 5 - 15    Comment: Performed at [facility]  Hemoglobin A1c     Status: Abnormal   Collection Time: 01/22/14  7:30 PM  Result Value Ref Range   Hgb A1c MFr Bld 6.1 (H) <5.7 %    Comment: (NOTE)                                                                       According to the ADA Clinical Practice Recommendations for 2011, when HbA1c is used as a screening test:  >=6.5%   Diagnostic of Diabetes Mellitus           (if abnormal result is confirmed) 5.7-6.4%   Increased risk of developing Diabetes Mellitus References:Diagnosis and Classification of Diabetes Mellitus,Diabetes KMMN,8177,11(AFBXU 1):S62-S69 and Standards of Medical Care in         Diabetes - 2011,Diabetes Care,2011,34 (Suppl 1):S11-S61.    Mean Plasma Glucose 128 (H) <117 mg/dL    Comment: Performed at [facility]  TSH     Status: None   Collection Time: 01/22/14  7:30 PM  Result Value Ref Range   TSH 0.581 0.350 - 4.500 uIU/mL    Comment: Performed at [facility]  Magnesium     Status: None   Collection Time: 01/24/14  6:11 AM  Result Value Ref Range   Magnesium 2.2 1.5 - 2.5 mg/dL    Comment: Performed at [facility]  Potassium     Status: None   Collection Time: 01/24/14  6:11 AM  Result Value Ref Range   Potassium 3.8 3.7 - 5.3 mEq/L    Comment: Performed at [facility]    Physical Findings: AIMS: Facial and Oral Movements Muscles of Facial Expression: None, normal Lips and Perioral Area: None, normal Jaw: None, normal Tongue: None, normal,Extremity  Movements Upper (arms, wrists, hands, fingers): None, normal Lower (legs, knees, ankles, toes): None, normal, Trunk Movements Neck, shoulders, hips: None, normal, Overall Severity Severity of abnormal movements (highest score from questions above): None, normal Incapacitation due to abnormal movements: None, normal Patient's awareness of abnormal movements (rate only patient's report): No Awareness, Dental Status Current problems with teeth and/or dentures?: No Does patient usually wear dentures?: No  CIWA:  CIWA-Ar Total: 5 COWS:  COWS Total Score: 6   Assessment:  At this time patient is partially improved. He does remain dysphoric and mildly irritable. He is not currently presenting with severe alcohol withdrawal symptoms. Upon admission, EKG was remarkable for quite elevated QTc. Although this has resolved, I would not want to re-challenge /treat with Seroquel , which patient has requested to help address insomnia/ anxiety.   Treatment Plan Summary: Daily contact with patient to assess and evaluate symptoms and progress in treatment Medication management See below  Plan: Continue inpatient treatment /milieu Continue Ativan taper as per alcohol detox protocol Continue Clonidine taper as per opiate detox protocol Continue Neurontin 400 mgrs TID Start Remeron 7.5 mgrs QHS- to address anxiety/mood/help with insomnia.  Medical Decision Making Problem Points:  Established problem, stable/improving (1), Review of last therapy session (1) and Review of psycho-social stressors (1) Data Points:  Review or order clinical lab tests (1) Review of medication regiment & side effects (2) Review of new medications or change in dosage (2)  I certify that inpatient services furnished can reasonably be expected to improve the patient's condition.   Taylen Wendland 01/24/2014, 11:06 AM

## 2014-01-24 NOTE — Progress Notes (Signed)
Adult Psychoeducational Group Note  Date:  01/24/2014 Time:  10:00am Group Topic/Focus:  Wellness Toolbox:   The focus of this group is to discuss various aspects of wellness, balancing those aspects and exploring ways to increase the ability to experience wellness.  Patients will create a wellness toolbox for use upon discharge.  Participation Level:  Active  Participation Quality:  Appropriate and Attentive  Affect:  Appropriate  Cognitive:  Alert and Appropriate  Insight: Appropriate  Engagement in Group:  Engaged  Modes of Intervention:  Discussion and Education  Additional Comments:   Pt attended and participated in group. Question was asked What is the one thing you wish people understood about you and What is your best quality? Pt stated I wish people understood what it was truly like to be in constant pain and my best quality is knowing what drives behavior in someone in pain.  Mark [name] D 01/24/2014, 3:59 PM

## 2014-01-24 NOTE — Progress Notes (Signed)
Pt presents irritable on approach and verbalized concerns about his medications. Pt appears anxious and easily agitated this morning. Pt stated that he is upset because his meds are not effective and he feels horrible. Pt stated that he's experiencing tactile hallucinations, sweats and tremors. Pt requesting to take Seroquel, a med that he use to take a year ago. Pt is on an ativan protocol and med given for withdrawals.  Medications administered as ordered per MD. Verbal support given. Pt encouraged to attend groups. 15 minute checks performed for safety.  Pt safety maintained. Pt receptive to treatment.

## 2014-01-24 NOTE — Progress Notes (Signed)
D   Pt has been visible on the milieu and has been interacting appropriately with others   He attends and participates in groups   He asks for medications frequently   He reports some tactile hallucinations but said they improve with medication A   Verbal support given   Medications administered and effectiveness monitored   Q 15 min checks  R   Pt safe at present

## 2014-01-24 NOTE — Progress Notes (Signed)
NUTRITION ASSESSMENT  Pt identified as at risk on the Malnutrition Screen Tool  INTERVENTION: 1. Educated patient on the importance of nutrition and encouraged intake of food and beverages. 2. Discussed weight goals. 3. Supplements: Ensure Complete po TID, each supplement provides 350 kcal and 13 grams of protein   NUTRITION DIAGNOSIS: Unintentional weight loss related to sub-optimal intake as evidenced by pt report.   Goal: Pt to meet >/= 90% of their estimated nutrition needs.  Monitor:  PO intake  Assessment:  48 year old separated WM who presented to the ED requesting assistance with alcohol withdrawal. He began drinking daily 2 months ago to help with his withdrawal symptoms from opiates. He had been taking opiate pain medication for chronic joint and chest pain until he started abusing it. He notes that he has been drinking liquor dailey from 1/5th to 1/2 gallon a day for the past two months.  Pt reports UBW of 220 lb, pt has lost around 18 lb since 11/5 (8% wt loss x 2 weeks, this is significant for time frame). He reports not eating for 2 days.   Pt is at nutritional risk given recent weight loss and Hx of Etoh abuse. Pt states that in the past he has been prescribed Seroquel and this improves his appetite.  Discussed with pt the importance of eating 3 meals a day with snacks, emphasizing protein consumption. Discussed the importance of good nutrition for mental health and aiding in recovery.  Provided pt with "Sobriety Nutrition Therapy" handout from the Academy of [employer] Dietetics.  Pt would like to receive Ensure supplements during his stay, will order TID.  Height: Ht Readings from Last 1 Encounters:  01/22/14 6\' 4"  (1.93 m)    Weight: Wt Readings from Last 1 Encounters:  01/22/14 200 lb 8 oz (90.946 kg)    Weight Hx: Wt Readings from Last 10 Encounters:  01/22/14 200 lb 8 oz (90.946 kg)  01/13/14 218 lb (98.884 kg)  12/21/13 203 lb 1.6 oz (92.126 kg)   05/28/13 206 lb 6.4 oz (93.622 kg)  01/21/13 197 lb (89.359 kg)  01/20/13 200 lb (90.719 kg)  12/28/12 197 lb (89.359 kg)  10/14/12 200 lb (90.719 kg)  06/25/12 185 lb 11.2 oz (84.233 kg)  03/08/12 195 lb 9.5 oz (88.72 kg)    BMI:  Body mass index is 24.42 kg/(m^2). Pt meets criteria for normal range based on current BMI.  Estimated Nutritional Needs: Kcal: 30-35 kcal/kg Protein: > 1.2 gram protein/kg Fluid: 1 ml/kcal  Diet Order: Diet regular Pt is also offered choice of unit snacks mid-morning and mid-afternoon.  Pt is eating as desired.   Lab results and medications reviewed.   Clayton Bibles, MS, RD, LDN Pager: [phone] After Hours Pager: [phone]

## 2014-01-24 NOTE — [facility]HH Suicide Risk Assessment (Addendum)
[place] INPATIENT:  Family/Significant Other Suicide Prevention Education  Suicide Prevention Education:  Patient Refusal for Family/Significant Other Suicide Prevention Education: The patient Mark [name] has refused to provide written consent for family/significant other to be provided Family/Significant Other Suicide Prevention Education during admission and/or prior to discharge.  Physician notified. SPE reviewed with patient, brochure provided.   Birch Farino, Casimiro Needle 01/24/2014, 12:51 PM

## 2014-01-25 LAB — POTASSIUM: Potassium: 3.6 mEq/L — ABNORMAL LOW (ref 3.7–5.3)

## 2014-01-25 LAB — MAGNESIUM: Magnesium: 2.3 mg/dL (ref 1.5–2.5)

## 2014-01-25 MED ORDER — MIRTAZAPINE 7.5 MG PO TABS
7.5000 mg | ORAL_TABLET | Freq: Every day | ORAL | Status: DC
  Administered 2014-01-25 – 2014-01-26 (×2): 7.5 mg via ORAL
  Filled 2014-01-25 (×4): qty 1

## 2014-01-25 MED ORDER — HALOPERIDOL 5 MG PO TABS
5.0000 mg | ORAL_TABLET | Freq: Four times a day (QID) | ORAL | Status: DC | PRN
  Administered 2014-01-25 – 2014-01-26 (×2): 5 mg via ORAL
  Filled 2014-01-25 (×2): qty 1

## 2014-01-25 MED ORDER — LIDOCAINE 5 % EX PTCH
1.0000 | MEDICATED_PATCH | CUTANEOUS | Status: DC
  Administered 2014-01-25 – 2014-01-28 (×4): 1 via TRANSDERMAL
  Filled 2014-01-25 (×2): qty 1
  Filled 2014-01-25: qty 14
  Filled 2014-01-25 (×2): qty 1

## 2014-01-25 MED ORDER — TRAZODONE HCL 50 MG PO TABS
50.0000 mg | ORAL_TABLET | Freq: Every evening | ORAL | Status: DC | PRN
  Administered 2014-01-25 – 2014-01-26 (×2): 50 mg via ORAL
  Filled 2014-01-25: qty 1

## 2014-01-25 MED ORDER — DULOXETINE HCL 30 MG PO CPEP
30.0000 mg | ORAL_CAPSULE | Freq: Every day | ORAL | Status: DC
  Administered 2014-01-25 – 2014-01-26 (×2): 30 mg via ORAL
  Filled 2014-01-25 (×4): qty 1

## 2014-01-25 MED ORDER — GABAPENTIN 300 MG PO CAPS
600.0000 mg | ORAL_CAPSULE | Freq: Three times a day (TID) | ORAL | Status: DC
  Administered 2014-01-25 – 2014-01-28 (×10): 600 mg via ORAL
  Filled 2014-01-25 (×8): qty 2
  Filled 2014-01-25: qty 84
  Filled 2014-01-25 (×3): qty 2
  Filled 2014-01-25: qty 84
  Filled 2014-01-25: qty 2
  Filled 2014-01-25: qty 84
  Filled 2014-01-25: qty 2

## 2014-01-25 MED ORDER — BENZTROPINE MESYLATE 1 MG PO TABS
1.0000 mg | ORAL_TABLET | Freq: Four times a day (QID) | ORAL | Status: DC | PRN
  Administered 2014-01-25 – 2014-01-26 (×2): 1 mg via ORAL
  Filled 2014-01-25 (×3): qty 1

## 2014-01-25 MED ORDER — MIRTAZAPINE 15 MG PO TABS: 15.0000 mg | ORAL_TABLET | Freq: Every day | ORAL | Status: DC

## 2014-01-25 NOTE — Progress Notes (Signed)
Patient ID: Mark [name], male   DOB: 1966/01/28, 48 y.o.   MRN: [id number] [facility] MD Progress Note  01/25/2014 6:04 PM Mark [name]  MRN:  [id number] Subjective: Patient describes ongoing chronic pain and some ongoing anxiety. Objective: I have discussed case with treatment team and have met with patient. Patient tends to focus on pain and describes chronic back pain and also peripheral neuropathy. He also complains of anxiety. Sleep fair , but improved compared to yesterday.' Describes some residual alcohol withdrawal symptoms, primarily anxiety, but does not appear to be in any acute withdrawal today, and has mild distal tremors. No diaphoresis. Has been visible in milieu and has been going to groups on a regular basis. No disruptive behaviors on unit. At this time not endorsing any side effects from medications.  Diagnosis:  Alcohol Dependence, Alcohol Withdrawal, Opiate Dependence  Total Time spent with patient: 25 minutes    ADL's:  Fair   Sleep: improving  Appetite: Good  Suicidal Ideation:  Currently denies any suicidal ideations  Homicidal Ideation:  Currently denies any homicidal ideations AEB (as evidenced by):  Psychiatric Specialty Exam: Physical Exam  ROS  Blood pressure 133/89, pulse 86, temperature 98.7 F (37.1 C), temperature source Oral, resp. rate 18, height '6\' 4"'  (1.93 m), weight 90.946 kg (200 lb 8 oz), SpO2 98 %.Body mass index is 24.42 kg/(m^2).  General Appearance: Fairly Groomed  Engineer, water::  Good  Speech:  Normal Rate  Volume:  Normal  Mood:  Dysphoric and Irritable  Affect:  reports anxiety  Thought Process:  Goal Directed and Linear  Orientation:  Full (Time, Place, and Person)  Thought Content:  no hallucinations and no delusions  Suicidal Thoughts:  No at this time denies any plan or intention of hurting himself and contracts for safety on the unit.  Homicidal Thoughts:  No  Memory:  recent and remote grossly intact   Judgement:   Fair  Insight:  Fair  Psychomotor Activity:  Normal  Concentration:  Good  Recall:  Good  Fund of Knowledge:Good  Language: Good  Akathisia:  Negative  Handed:  Right  AIMS (if indicated):     Assets:  Communication Skills Desire for Improvement Resilience  Sleep:  Number of Hours: 6   Musculoskeletal: Strength & Muscle Tone: within normal limits- no diaphoresis and minimal distal tremors noted. Gait & Station: normal Patient leans: N/A  Current Medications: Current Facility-Administered Medications  Medication Dose Route Frequency Provider Last Rate Last Dose  . acetaminophen (TYLENOL) tablet 650 mg  650 mg Oral Q6H PRN Charlcie Cradle, MD   650 mg at 01/23/14 0147  . alum & mag hydroxide-simeth (MAALOX/MYLANTA) 200-200-20 MG/5ML suspension 30 mL  30 mL Oral Q4H PRN Charlcie Cradle, MD      . cloNIDine (CATAPRES) tablet 0.1 mg  0.1 mg Oral QID Nena Polio, PA-C   0.1 mg at 01/25/14 1653   Followed by  . [START ON 01/26/2014] cloNIDine (CATAPRES) tablet 0.1 mg  0.1 mg Oral BH-qamhs Nena Polio, PA-C       Followed by  . [START ON 01/28/2014] cloNIDine (CATAPRES) tablet 0.1 mg  0.1 mg Oral QAC breakfast Nena Polio, PA-C      . dicyclomine (BENTYL) tablet 20 mg  20 mg Oral Q6H PRN Nena Polio, PA-C      . DULoxetine (CYMBALTA) DR capsule 30 mg  30 mg Oral Daily Jenne Campus, MD   30 mg at 01/25/14 1209  . feeding supplement (ENSURE COMPLETE) (  ENSURE COMPLETE) liquid 237 mL  237 mL Oral TID BM Clayton Bibles, RD   237 mL at 01/25/14 1400  . gabapentin (NEURONTIN) capsule 600 mg  600 mg Oral TID Jenne Campus, MD   600 mg at 01/25/14 1654  . hydrOXYzine (ATARAX/VISTARIL) tablet 50 mg  50 mg Oral Q8H PRN Jenne Campus, MD   50 mg at 01/25/14 1052  . lidocaine (LIDODERM) 5 % 1 patch  1 patch Transdermal Q24H Janett Labella, NP   1 patch at 01/25/14 1203  . loperamide (IMODIUM) capsule 2-4 mg  2-4 mg Oral PRN Nena Polio, PA-C      . LORazepam (ATIVAN) tablet 1 mg   1 mg Oral Q6H PRN Charlcie Cradle, MD   1 mg at 01/25/14 1053  . losartan (COZAAR) tablet 100 mg  100 mg Oral Daily Nena Polio, PA-C   100 mg at 01/25/14 0801  . magnesium hydroxide (MILK OF MAGNESIA) suspension 30 mL  30 mL Oral Daily PRN Charlcie Cradle, MD      . methocarbamol (ROBAXIN) tablet 500 mg  500 mg Oral Q8H PRN Nena Polio, PA-C   500 mg at 01/25/14 1053  . metoprolol (LOPRESSOR) tablet 50 mg  50 mg Oral BID Nena Polio, PA-C   50 mg at 01/25/14 1654  . mirtazapine (REMERON) tablet 7.5 mg  7.5 mg Oral QHS Jenne Campus, MD      . multivitamin with minerals tablet 1 tablet  1 tablet Oral Daily Charlcie Cradle, MD   1 tablet at 01/25/14 0803  . naproxen (NAPROSYN) tablet 500 mg  500 mg Oral BID PRN Nena Polio, PA-C   500 mg at 01/25/14 1655  . nicotine (NICODERM CQ - dosed in mg/24 hours) patch 14 mg  14 mg Transdermal Daily Nena Polio, PA-C   14 mg at 01/25/14 0804  . ondansetron (ZOFRAN-ODT) disintegrating tablet 4 mg  4 mg Oral Q6H PRN Nena Polio, PA-C   4 mg at 01/24/14 0641  . pantoprazole (PROTONIX) EC tablet 40 mg  40 mg Oral BID AC Neil Mashburn, PA-C   40 mg at 01/25/14 1654  . thiamine (B-1) injection 100 mg  100 mg Intramuscular Once Charlcie Cradle, MD      . thiamine (VITAMIN B-1) tablet 100 mg  100 mg Oral Daily Charlcie Cradle, MD   100 mg at 01/25/14 0804  . traZODone (DESYREL) tablet 50 mg  50 mg Oral QHS PRN Jenne Campus, MD        Lab Results:  Results for orders placed or performed during the hospital encounter of 01/22/14 (from the past 48 hour(s))  Magnesium     Status: None   Collection Time: 01/24/14  6:11 AM  Result Value Ref Range   Magnesium 2.2 1.5 - 2.5 mg/dL    Comment: Performed at [facility]  Potassium     Status: None   Collection Time: 01/24/14  6:11 AM  Result Value Ref Range   Potassium 3.8 3.7 - 5.3 mEq/L    Comment: Performed at [facility]  Magnesium     Status: None   Collection  Time: 01/25/14  6:23 AM  Result Value Ref Range   Magnesium 2.3 1.5 - 2.5 mg/dL    Comment: Performed at [facility]  Potassium     Status: Abnormal   Collection Time: 01/25/14  6:23 AM  Result Value Ref Range   Potassium 3.6 (L) 3.7 - 5.3  mEq/L    Comment: Performed at [facility]    Physical Findings: AIMS: Facial and Oral Movements Muscles of Facial Expression: None, normal Lips and Perioral Area: None, normal Jaw: None, normal Tongue: None, normal,Extremity Movements Upper (arms, wrists, hands, fingers): None, normal Lower (legs, knees, ankles, toes): None, normal, Trunk Movements Neck, shoulders, hips: None, normal, Overall Severity Severity of abnormal movements (highest score from questions above): None, normal Incapacitation due to abnormal movements: None, normal Patient's awareness of abnormal movements (rate only patient's report): No Awareness, Dental Status Current problems with teeth and/or dentures?: No Does patient usually wear dentures?: No  CIWA:  CIWA-Ar Total: 0 COWS:  COWS Total Score: 3   Assessment:  At this time patient  Remains vaguely irritable and anxious. He is focused on chronic back pain and neuropathic pain. He is not presenting with severe alcohol withdrawal at the present time and vitals are stable. Not suicidal .  May benefit from Cymbalta for depression, anxiety and chronic pain, and also from Neurontin titration , which he is tolerating well thus far   Treatment Plan Summary: Daily contact with patient to assess and evaluate symptoms and progress in treatment Medication management See below  Plan: Continue inpatient treatment /milieu Continue Clonidine taper as per opiate detox protocol Increase Neurontin  To 600  mgrs TID Cymbalta 30 mgrs QAM Remeron 7.5 mgrs QHS Trazodone 50 mgrs QHS PRN Insomnia if needed       Medical Decision Making Problem Points:  Established problem, stable/improving  (1), Review of last therapy session (1) and Review of psycho-social stressors (1) Data Points:  Review or order clinical lab tests (1) Review of medication regiment & side effects (2) Review of new medications or change in dosage (2)  I certify that inpatient services furnished can reasonably be expected to improve the patient's condition.   COBOS, [place] 01/25/2014, 6:04 PM

## 2014-01-25 NOTE — Progress Notes (Signed)
Pt attended spiritual care group on grief and loss facilitated by chaplain Jerene Pitch and counseling intern Martinique Austin. Group opened with brief discussion and psycho-social ed around grief and loss in relationships and in relation to self - identifying life patterns, circumstances, changes that cause losses. Established group norm of speaking from own life experience. Group goal of establishing open and affirming space for members to share loss and experience with grief, normalize grief experience and provide psycho social education and grief support.   Mark [name] joined group late in session.  As another group member identified fear around feeling "out of control" as contributing to substance use, Mark [name] volunteered that feeling of pain contributes to his use.  Described using to cope with pain in the short term.  Was not specific about cause of pain.   Creta Levin MDiv  12:19 PM 01/25/2014

## 2014-01-25 NOTE — Clinical Social Work Note (Signed)
CSW provided patient with information on [facility] and [facility] for PCP services and encouraged him to speak with PCP regarding pain management services. Patient reports that he is already linked up to [facility] and verbalized his understanding.  Tilden Fossa, MSW, [place] Worker [facility] [phone]

## 2014-01-25 NOTE — Progress Notes (Addendum)
Pt is very verbal this am. He stated,"I need a pain patch for all over my body." I just ache all over."Pt denies Si or HI and contracts for safety. He does appear to get along with the other pts. He has been in the dayroom this am socializing. Pt is cooperative . He states he slept will last pm. Pt rates his depression a 8/10 and his hopelessness a 7/10. He stated the neuropathies of both of his feet are still really bad,10:50am -Pt stated he needed pain medication and meds for his anxiety. Pt was given 1mg  of ativan , 500mg  of robaxin and 50mg  of visteral.

## 2014-01-25 NOTE — Plan of Care (Signed)
Problem: Ineffective individual coping Goal: LTG: Patient will report a decrease in negative feelings Outcome: Progressing Pt does have a decrease in negative feelings and primarily wants to work on pain control,

## 2014-01-25 NOTE — Tx Team (Signed)
Interdisciplinary Treatment Plan Update (Adult) Date: 01/25/2014   Time Reviewed: 9:30 AM  Progress in Treatment: Attending groups: Continuing to assess Participating in groups: Continuing to assess Taking medication as prescribed: Yes Tolerating medication: Yes Family/Significant other contact made: No, Patient has declined for CSW to make collateral contact Patient understands diagnosis: Yes Discussing patient identified problems/goals with staff: Yes Medical problems stabilized or resolved: Yes Denies suicidal/homicidal ideation: Continuing to assess Issues/concerns per patient self-inventory: Yes Other:  New problem(s) identified: N/A  Discharge Plan or Barriers: Patient plans to return home to follow up with [facility] for therapy and medication management services. Patient complaining of chronic pain.  Reason for Continuation of Hospitalization:  Depression Anxiety Medication Stabilization   Comments: N/A  Estimated length of stay: 3-5 days  For review of initial/current patient goals, please see plan of care. Patient is 48 YO separated unemployed caucasian male admitted with diagnosis of Substance Induced Mood Disorder, Alcohol Dependence Severe and Opoid Dependence. Patient would benefit from crisis stabilization, medication evaluation, therapy groups for processing thoughts/feelings/experiences, psycho ed groups for increasing coping skills, and aftercare planning.  Attendees: Patient:    Family:    Physician: Dr. Parke Poisson 01/25/2014 9:30 AM  Nursing: Lawernce Pitts, Juel Burrow, RN 01/25/2014 9:30 AM  Clinical Social Worker: Erasmo Downer Jannet Calip,  LCSWA 01/25/2014 9:30 AM  Other: Joette Catching, LCSW 01/25/2014 9:30 AM  Other: Lucinda Dell, [place] [facility] Liaison 01/25/2014 9:30 AM  Other: Ermalinda Barrios, Pharmacist  01/25/2014 9:30 AM  Other:  01/25/2014 9:30 AM  Other:  01/25/2014 9:30 AM  Other:    Other:    Other:    Other:           Scribe for Treatment Team:  Tilden Fossa, MSW, [employer] [phone]

## 2014-01-25 NOTE — BHH Group Notes (Signed)
[place] LCSW Group Therapy  01/25/2014   1:15 PM   Type of Therapy:  Group Therapy  Participation Level:  Active  Participation Quality:  Attentive, Sharing and Supportive  Affect:  Depressed and Flat  Cognitive:  Alert and Oriented  Insight:  Developing/Improving and Engaged  Engagement in Therapy:  Developing/Improving and Engaged  Modes of Intervention:  Clarification, Confrontation, Discussion, Education, Exploration, Limit-setting, Orientation, Problem-solving, Rapport Building, Art therapist, Socialization and Support  Summary of Progress/Problems: The topic for group therapy was feelings about diagnosis.  Pt actively participated in group discussion on their past and current diagnosis and how they feel towards this.  Pt also identified how society and family members judge them, based on their diagnosis as well as stereotypes and stigmas.  Patient discussed the difficulty of dealing with chronic pain and his substance abuse as a way to self-medicate. CSW's and other group members provided emotional support and encouragement.   Tilden Fossa, MSW, [place] Worker [facility] [phone]

## 2014-01-25 NOTE — Progress Notes (Signed)
Recreation Therapy Notes  Animal-Assisted Activity/Therapy (AAA/T) Program Checklist/Progress Notes Patient Eligibility Criteria Checklist & Daily Group note for Rec Tx Intervention  Date: 11.17.2015 Time: 2:45pm Location: [address] Film/video editor    AAA/T Program Assumption of Risk Form signed by Patient/ or Parent Legal Guardian yes  Patient is free of allergies or sever asthma yes  Patient reports no fear of animals yes  Patient reports no history of cruelty to animals yes   Patient understands his/her participation is voluntary yes  Patient washes hands before animal contact yes  Patient washes hands after animal contact yes  Behavioral Response: Appropriate    Education: Hand Washing, Appropriate Animal Interaction   Education Outcome: Acknowledges education.   Clinical Observations/Feedback: Patient actively engaged in group session, petting therapy dog appropriately and interacting with peers appropriately. Additionally patient asked appropriate questions about therapy dog and his training and shared stories about his pets at home with group.   Laureen Ochs Lavanya Roa, LRT/CTRS  Ashanty Coltrane L 01/25/2014 4:09 PM

## 2014-01-25 NOTE — BHH Group Notes (Signed)
[place] Group Notes:  stress  Date:  01/25/2014  Time:  9:25 AM  Type of Therapy:  Nurse Education  Participation Level:  Active  Participation Quality:  Appropriate  Affect:  Appropriate  Cognitive:  Alert  Insight:  Appropriate  Engagement in Group:  Engaged  Modes of Intervention:  Discussion  Summary of Progress/Problems:Pt did attend  Marcello Moores [facility] 01/25/2014, 9:25 AM

## 2014-01-26 LAB — POTASSIUM: POTASSIUM: 3.6 meq/L — AB (ref 3.7–5.3)

## 2014-01-26 LAB — MAGNESIUM: Magnesium: 2.4 mg/dL (ref 1.5–2.5)

## 2014-01-26 MED ORDER — LORAZEPAM 0.5 MG PO TABS
0.5000 mg | ORAL_TABLET | Freq: Three times a day (TID) | ORAL | Status: DC
  Administered 2014-01-26 – 2014-01-28 (×7): 0.5 mg via ORAL
  Filled 2014-01-26 (×7): qty 1

## 2014-01-26 MED ORDER — LORAZEPAM 1 MG PO TABS: 1.0000 mg | ORAL_TABLET | Freq: Once | ORAL | Status: DC

## 2014-01-26 MED ORDER — TRAZODONE HCL 50 MG PO TABS
50.0000 mg | ORAL_TABLET | Freq: Every evening | ORAL | Status: DC | PRN
  Administered 2014-01-26 – 2014-01-27 (×2): 50 mg via ORAL
  Filled 2014-01-26: qty 28
  Filled 2014-01-26 (×2): qty 1
  Filled 2014-01-26: qty 28
  Filled 2014-01-26 (×4): qty 1

## 2014-01-26 NOTE — Progress Notes (Signed)
Patient ID: Mark [name], male   DOB: 19-Sep-1965, 48 y.o.   MRN: [id number] This AM he was requesting medication for anxiety.when order obtained and given he calmed and at this time he is asleep in bed after he returned from lunch.  Self inventory this AM: depression , hopelessness at 8, anxiety 10. Withdrawals of tremors diarrhea. Sedation.cramping,irritability, agitation and chilling. He only requested prn for his anxiety. He denies SI thoughts. Physical discomfort of lightheadness, dizziness, Headache, blurred vision  And pain. He had no goal s for today.

## 2014-01-26 NOTE — Progress Notes (Signed)
Patient in day room interacting with peers at the beginning of the shift. Mood and affects flat and depressed. Patient reported that he's feeling tremorlous and having generalized pain. Rated his pain at 8 on the scale of 1-10 with 10 the worst. Patient also said he has been feeling agitated and short tempered. Patient received Vistaril 50 mg right before the change of shift per report. Writer offered patient Robaxin 500 mg for his pain. Encouraged patient to attend AA group. Q 15 minute check continues to maintain safety.

## 2014-01-26 NOTE — BHH Group Notes (Signed)
[place] LCSW Group Therapy 01/26/2014  1:15 PM   Type of Therapy: Group Therapy  Participation Level: Did Not Attend for unknown reason.   Tilden Fossa, MSW, [place] Worker [facility] [phone]

## 2014-01-26 NOTE — BHH Group Notes (Signed)
   [facility] LCSW Aftercare Discharge Planning Group Note  01/26/2014  8:45 AM   Participation Quality: Alert, Appropriate and Oriented  Mood/Affect: Depressed and Flat  Depression Rating: 8  Anxiety Rating: 10  Thoughts of Suicide: Pt denies SI/HI  Will you contract for safety? Yes  Current AVH: Pt denies  Plan for Discharge/Comments: Pt attended discharge planning group and actively participated in group. CSW provided pt with today's workbook. Patient reports feeling the same as on admission and complained of physical pain. Patient plans to discharge home to follow up with [place].  Transportation Means: Pt reports access to transportation  Supports: No supports mentioned at this time  Tilden Fossa, MSW, [place] Social Worker [employer] [phone]

## 2014-01-26 NOTE — Progress Notes (Signed)
Patient ID: Mark [name], male   DOB: 05-27-1965, 48 y.o.   MRN: [id number] [facility] MD Progress Note  01/26/2014 10:41 AM STEVIE CHARTER  MRN:  [id number] Subjective: Patient reports very significant discomfort which he describes as " massive anxiety" and a subjective sense of agitation and " skin crawling". Objective: I have discussed case with treatment team and have met with patient. As reviewed with staff patient has been anxious, reporting symptoms as above, and frequently requesting PRNS. He reports ongoing pain, which as mentioned in prior notes, is chronic. Beyond the pain, however, he is reporting a subjective sense of dysesthesias ( " skin crawling") and severe anxiety, as well as a subjective sense of agitation. His vitals are stable- he does have some distal tremors, he is not diaphoretic. He has been going to most groups. Behavior on unit is in good control, although noted to be focused on medications and PRNS . Mg++ 2.4, K+ 3.6   Diagnosis:  Alcohol Dependence, Alcohol Withdrawal, Opiate Dependence  Total Time spent with patient: 25 minutes    ADL's:  Fair   Sleep: improving- he describes sleep as fair, but as per nursing report he slept about 6 hours last night  Appetite: Good  Suicidal Ideation:  Currently denies any suicidal ideations  Homicidal Ideation:  Currently denies any homicidal ideations AEB (as evidenced by):  Psychiatric Specialty Exam: Physical Exam  Review of Systems  Constitutional: Negative for fever and chills.  Respiratory: Negative for shortness of breath and wheezing.   Cardiovascular: Negative for chest pain.  Gastrointestinal: Positive for diarrhea. Negative for vomiting.  Musculoskeletal: Positive for myalgias and back pain.  Skin: Negative.  Negative for rash.       Describes dysesthesias   Neurological: Negative for seizures.  Psychiatric/Behavioral: Positive for depression and substance abuse. The patient is nervous/anxious and has  insomnia.     Blood pressure 134/81, pulse 96, temperature 98.8 F (37.1 C), temperature source Oral, resp. rate 20, height $RemoveBe'6\' 4"'rntrdiLVM$  (1.93 m), weight 90.946 kg (200 lb 8 oz), SpO2 98 %.Body mass index is 24.42 kg/(m^2).  General Appearance: Fairly Groomed  Engineer, water::  Good  Speech:  Normal Rate  Volume:  Normal  Mood:  Anxious and Dysphoric  Affect:  reports anxiety, and states today it is worse compared to before  Thought Process:  Goal Directed and Linear  Orientation:  Full (Time, Place, and Person)  Thought Content:  no hallucinations and no delusions  Suicidal Thoughts:  No at this time denies any plan or intention of hurting himself and contracts for safety on the unit.  Homicidal Thoughts:  No  Memory:  recent and remote grossly intact   Judgement:  Fair  Insight:  Fair  Psychomotor Activity:  Normal- describes sense of agitation but does not appear overtly restless or activated   Concentration:  Good  Recall:  Good  Fund of Knowledge:Good  Language: Good  Akathisia:  Negative  Handed:  Right  AIMS (if indicated):     Assets:  Communication Skills Desire for Improvement Resilience  Sleep:  Number of Hours: 6   Musculoskeletal: Strength & Muscle Tone: within normal limits- no diaphoresis and minimal distal tremors noted. Gait & Station: normal Patient leans: N/A  Current Medications: Current Facility-Administered Medications  Medication Dose Route Frequency Provider Last Rate Last Dose  . acetaminophen (TYLENOL) tablet 650 mg  650 mg Oral Q6H PRN Charlcie Cradle, MD   650 mg at 01/23/14 0147  . alum &  mag hydroxide-simeth (MAALOX/MYLANTA) 200-200-20 MG/5ML suspension 30 mL  30 mL Oral Q4H PRN Charlcie Cradle, MD      . cloNIDine (CATAPRES) tablet 0.1 mg  0.1 mg Oral BH-qamhs Nena Polio, PA-C   0.1 mg at 01/26/14 0737   Followed by  . [START ON 01/28/2014] cloNIDine (CATAPRES) tablet 0.1 mg  0.1 mg Oral QAC breakfast Nena Polio, PA-C      . dicyclomine (BENTYL)  tablet 20 mg  20 mg Oral Q6H PRN Nena Polio, PA-C      . DULoxetine (CYMBALTA) DR capsule 30 mg  30 mg Oral Daily Jenne Campus, MD   30 mg at 01/26/14 0737  . feeding supplement (ENSURE COMPLETE) (ENSURE COMPLETE) liquid 237 mL  237 mL Oral TID BM Clayton Bibles, RD   237 mL at 01/26/14 0739  . gabapentin (NEURONTIN) capsule 600 mg  600 mg Oral TID Jenne Campus, MD   600 mg at 01/26/14 0736  . hydrOXYzine (ATARAX/VISTARIL) tablet 50 mg  50 mg Oral Q8H PRN Jenne Campus, MD   50 mg at 01/26/14 0245  . lidocaine (LIDODERM) 5 % 1 patch  1 patch Transdermal Q24H Janett Labella, NP   1 patch at 01/25/14 1203  . loperamide (IMODIUM) capsule 2-4 mg  2-4 mg Oral PRN Nena Polio, PA-C      . losartan (COZAAR) tablet 100 mg  100 mg Oral Daily Nena Polio, PA-C   100 mg at 01/26/14 0736  . magnesium hydroxide (MILK OF MAGNESIA) suspension 30 mL  30 mL Oral Daily PRN Charlcie Cradle, MD      . methocarbamol (ROBAXIN) tablet 500 mg  500 mg Oral Q8H PRN Nena Polio, PA-C   500 mg at 01/25/14 2131  . metoprolol (LOPRESSOR) tablet 50 mg  50 mg Oral BID Nena Polio, PA-C   50 mg at 01/26/14 0940  . mirtazapine (REMERON) tablet 7.5 mg  7.5 mg Oral QHS Myer Peer Juliona Vales, MD   7.5 mg at 01/25/14 2131  . multivitamin with minerals tablet 1 tablet  1 tablet Oral Daily Charlcie Cradle, MD   1 tablet at 01/26/14 0737  . naproxen (NAPROSYN) tablet 500 mg  500 mg Oral BID PRN Nena Polio, PA-C   500 mg at 01/26/14 7680  . nicotine (NICODERM CQ - dosed in mg/24 hours) patch 14 mg  14 mg Transdermal Daily Nena Polio, PA-C   14 mg at 01/26/14 8811  . ondansetron (ZOFRAN-ODT) disintegrating tablet 4 mg  4 mg Oral Q6H PRN Nena Polio, PA-C   4 mg at 01/24/14 0641  . pantoprazole (PROTONIX) EC tablet 40 mg  40 mg Oral BID AC Nena Polio, PA-C   40 mg at 01/26/14 0315  . thiamine (B-1) injection 100 mg  100 mg Intramuscular Once Charlcie Cradle, MD      . thiamine (VITAMIN B-1) tablet 100 mg  100 mg  Oral Daily Charlcie Cradle, MD   100 mg at 01/26/14 0736  . traZODone (DESYREL) tablet 50 mg  50 mg Oral QHS PRN Jenne Campus, MD   50 mg at 01/25/14 2131    Lab Results:  Results for orders placed or performed during the hospital encounter of 01/22/14 (from the past 48 hour(s))  Magnesium     Status: None   Collection Time: 01/25/14  6:23 AM  Result Value Ref Range   Magnesium 2.3 1.5 - 2.5 mg/dL    Comment: Performed at [facility]  Potassium  Status: Abnormal   Collection Time: 01/25/14  6:23 AM  Result Value Ref Range   Potassium 3.6 (L) 3.7 - 5.3 mEq/L    Comment: Performed at [facility]  Magnesium     Status: None   Collection Time: 01/26/14  6:33 AM  Result Value Ref Range   Magnesium 2.4 1.5 - 2.5 mg/dL    Comment: Performed at [facility]  Potassium     Status: Abnormal   Collection Time: 01/26/14  6:33 AM  Result Value Ref Range   Potassium 3.6 (L) 3.7 - 5.3 mEq/L    Comment: Performed at [facility]    Physical Findings: AIMS: Facial and Oral Movements Muscles of Facial Expression: None, normal Lips and Perioral Area: None, normal Jaw: None, normal Tongue: None, normal,Extremity Movements Upper (arms, wrists, hands, fingers): None, normal Lower (legs, knees, ankles, toes): None, normal, Trunk Movements Neck, shoulders, hips: None, normal, Overall Severity Severity of abnormal movements (highest score from questions above): None, normal Incapacitation due to abnormal movements: None, normal Patient's awareness of abnormal movements (rate only patient's report): No Awareness, Dental Status Current problems with teeth and/or dentures?: No Does patient usually wear dentures?: No  CIWA:  CIWA-Ar Total: 5 COWS:  COWS Total Score: 3   Assessment:  Patient is presenting with severe subjective anxiety. He endorses a combination of factors , to include his chronic pain, withdrawal, and a  subjective sense of " crawling skin" which he states is new. He does seem uncomfortable but is not physically agitated, restless and no evidence of akathisia at this time. He is describing some symptoms of opiate WDL such as loose stools and his current anxiety symptoms may be at least partially related to [facility]. One possibility is that some of his reported symptoms of anxiety, dysesthesias and activation is partly related to medication side effect, particularly cymbalta , which is new for him. Would not suspect neurontin , as he has been on this medication for  A long time, without side effects. I do think that a short course of Ativan may be required to address the severity of his symptoms , but based on his history would work on tapering this medication off prior to discharge.   Treatment Plan Summary: Daily contact with patient to assess and evaluate symptoms and progress in treatment Medication management See below  Plan: Continue inpatient treatment /milieu Continue Clonidine taper as per opiate detox protocol Continue  Neurontin   600  mgrs TID D/C Cymbalta 30 due to concerns it could be contributing to symptoms Remeron 7.5 mgrs QHS Ativan 0.5 mgrs TID for a short period of time to address significant anxiety and symptoms Trazodone 50 mgrs QHS PRN Insomnia if needed       Medical Decision Making Problem Points:  Established problem, worsening (2), Review of last therapy session (1) and Review of psycho-social stressors (1) Data Points:  Review or order clinical lab tests (1) Review of medication regiment & side effects (2) Review of new medications or change in dosage (2)  I certify that inpatient services furnished can reasonably be expected to improve the patient's condition.   Porsche Noguchi 01/26/2014, 10:41 AM

## 2014-01-26 NOTE — Progress Notes (Signed)
D   Pt request medications as often and as many as he can have   He is irritable and anxious  He does interact appropriately with others and attends groups    A   Verbal support given   Medications administered and effectiveness monitored   Q 15 min checks R   Pt safe at present

## 2014-01-27 LAB — MAGNESIUM: Magnesium: 2.2 mg/dL (ref 1.5–2.5)

## 2014-01-27 LAB — POTASSIUM: Potassium: 3.6 mEq/L — ABNORMAL LOW (ref 3.7–5.3)

## 2014-01-27 MED ORDER — MIRTAZAPINE 15 MG PO TABS
15.0000 mg | ORAL_TABLET | Freq: Every day | ORAL | Status: DC
  Administered 2014-01-27: 15 mg via ORAL
  Filled 2014-01-27: qty 1
  Filled 2014-01-27: qty 14
  Filled 2014-01-27 (×2): qty 1

## 2014-01-27 MED ORDER — BENZTROPINE MESYLATE 1 MG PO TABS
1.0000 mg | ORAL_TABLET | Freq: Four times a day (QID) | ORAL | Status: DC | PRN
  Administered 2014-01-27: 1 mg via ORAL
  Filled 2014-01-27: qty 1

## 2014-01-27 MED ORDER — MELOXICAM 7.5 MG PO TABS
15.0000 mg | ORAL_TABLET | Freq: Every day | ORAL | Status: DC
  Administered 2014-01-27 – 2014-01-28 (×2): 15 mg via ORAL
  Filled 2014-01-27: qty 28
  Filled 2014-01-27 (×3): qty 1
  Filled 2014-01-27: qty 2

## 2014-01-27 MED ORDER — HALOPERIDOL 5 MG PO TABS
5.0000 mg | ORAL_TABLET | Freq: Four times a day (QID) | ORAL | Status: DC | PRN
  Administered 2014-01-27: 5 mg via ORAL
  Filled 2014-01-27: qty 1

## 2014-01-27 MED ORDER — KETOROLAC TROMETHAMINE 10 MG PO TABS: 10.0000 mg | ORAL_TABLET | Freq: Four times a day (QID) | ORAL | Status: DC | PRN

## 2014-01-27 NOTE — Progress Notes (Signed)
Patient ID: Mark [name], male   DOB: 05-Aug-1965, 48 y.o.   MRN: [id number] [facility] MD Progress Note  01/27/2014 2:01 PM ELEANOR DIMICHELE  MRN:  [id number] Subjective: Patient states that he is still feeling anxious, which he attributes to chronic pain. He does , however, admit he is improved compared to yesterday, and states the skin crawling feelings he had yesterday are now improved. Objective: I have discussed case with treatment team and have met with patient. He seems significantly improved today- with less agitation , less anxiety. He appears calm and in no acute distress, and has been noted by Probation officer and staff to be interacting cheerfully with peers in day room. Patient continues to report chronic pain, but states " at least my neuropathy is a little better", and states that lidocaine patch and Neurontin to seem to " take the edge off" He remains focused on medications, and is hoping to be referred to a pain clinic after discharge.  States his sleep has been fair, as per nursing staff slept better, about 6 hours.  Denies medication side effects. No disruptive behaviors on unit. No current withdrawal symptoms noted- he states he has some residual nausea and soft stools. Vitals are stable.  Diagnosis:  Alcohol Dependence, Alcohol Withdrawal, Opiate Dependence  Total Time spent with patient: 25 minutes    ADL's:  Fair   Sleep: improving- he describes sleep as fair, but as per nursing report he slept about 6 hours last night  Appetite: Good  Suicidal Ideation:  Currently denies any suicidal ideations  Homicidal Ideation:  Currently denies any homicidal ideations AEB (as evidenced by):  Psychiatric Specialty Exam: Physical Exam  Review of Systems  Constitutional: Negative for fever and chills.  Respiratory: Negative for shortness of breath and wheezing.   Cardiovascular: Negative for chest pain.  Gastrointestinal: Positive for diarrhea. Negative for vomiting.  Musculoskeletal:  Positive for myalgias and back pain.  Skin: Negative.  Negative for rash.       Describes dysesthesias   Neurological: Negative for seizures.  Psychiatric/Behavioral: Positive for depression and substance abuse. The patient is nervous/anxious and has insomnia.     Blood pressure 150/90, pulse 76, temperature 97.8 F (36.6 C), temperature source Oral, resp. rate 16, height _0  (1.93 m), weight 90.946 kg (200 lb 8 oz), SpO2 98 %.Body mass index is 24.42 kg/(m^2).  General Appearance: improved grooming  Eye Contact::  Good  Speech:  Normal Rate  Volume:  Normal  Mood:  improved mood, seems less anxious, less dysphoric  Affect:  Affect less anxious, fuller in range   Thought Process:  Goal Directed and Linear  Orientation:  Full (Time, Place, and Person)  Thought Content:  no hallucinations and no delusions  Suicidal Thoughts:  No at this time denies any plan or intention of hurting himself and contracts for safety on the unit.  Homicidal Thoughts:  No  Memory:  recent and remote grossly intact   Judgement:  Fair  Insight:  Fair  Psychomotor Activity:  Normal- no agitation or restlessness, appears calm and comfortable  Concentration:  Good  Recall:  Good  Fund of Knowledge:Good  Language: Good  Akathisia:  Negative  Handed:  Right  AIMS (if indicated):     Assets:  Communication Skills Desire for Improvement Resilience  Sleep:  Number of Hours: 6   Musculoskeletal: Strength & Muscle Tone: within normal limits- no diaphoresis and minimal distal tremors noted. Gait & Station: normal Patient leans: N/A  Current  Medications: Current Facility-Administered Medications  Medication Dose Route Frequency Provider Last Rate Last Dose  . acetaminophen (TYLENOL) tablet 650 mg  650 mg Oral Q6H PRN Charlcie Cradle, MD   650 mg at 01/23/14 0147  . alum & mag hydroxide-simeth (MAALOX/MYLANTA) 200-200-20 MG/5ML suspension 30 mL  30 mL Oral Q4H PRN Charlcie Cradle, MD      . cloNIDine  (CATAPRES) tablet 0.1 mg  0.1 mg Oral BH-qamhs Nena Polio, PA-C   0.1 mg at 01/27/14 9935   Followed by  . [START ON 01/28/2014] cloNIDine (CATAPRES) tablet 0.1 mg  0.1 mg Oral QAC breakfast Nena Polio, PA-C      . dicyclomine (BENTYL) tablet 20 mg  20 mg Oral Q6H PRN Nena Polio, PA-C      . feeding supplement (ENSURE COMPLETE) (ENSURE COMPLETE) liquid 237 mL  237 mL Oral TID BM Clayton Bibles, RD   237 mL at 01/27/14 0955  . gabapentin (NEURONTIN) capsule 600 mg  600 mg Oral TID Jenne Campus, MD   600 mg at 01/27/14 1153  . hydrOXYzine (ATARAX/VISTARIL) tablet 50 mg  50 mg Oral Q8H PRN Jenne Campus, MD   50 mg at 01/27/14 0634  . lidocaine (LIDODERM) 5 % 1 patch  1 patch Transdermal Q24H Janett Labella, NP   1 patch at 01/27/14 1154  . loperamide (IMODIUM) capsule 2-4 mg  2-4 mg Oral PRN Nena Polio, PA-C      . LORazepam (ATIVAN) tablet 0.5 mg  0.5 mg Oral TID Jenne Campus, MD   0.5 mg at 01/27/14 1154  . LORazepam (ATIVAN) tablet 1 mg  1 mg Oral Once Jenne Campus, MD   1 mg at 01/26/14 1137  . losartan (COZAAR) tablet 100 mg  100 mg Oral Daily Nena Polio, PA-C   100 mg at 01/27/14 0741  . magnesium hydroxide (MILK OF MAGNESIA) suspension 30 mL  30 mL Oral Daily PRN Charlcie Cradle, MD      . meloxicam [facility]) tablet 15 mg  15 mg Oral Daily Sheila May Agustin, NP      . methocarbamol (ROBAXIN) tablet 500 mg  500 mg Oral Q8H PRN Nena Polio, PA-C   500 mg at 01/27/14 0853  . metoprolol (LOPRESSOR) tablet 50 mg  50 mg Oral BID Nena Polio, PA-C   50 mg at 01/27/14 7017  . mirtazapine (REMERON) tablet 7.5 mg  7.5 mg Oral QHS Myer Peer Cobos, MD   7.5 mg at 01/26/14 2122  . multivitamin with minerals tablet 1 tablet  1 tablet Oral Daily Charlcie Cradle, MD   1 tablet at 01/27/14 0741  . naproxen (NAPROSYN) tablet 500 mg  500 mg Oral BID PRN Nena Polio, PA-C   500 mg at 01/27/14 0349  . nicotine (NICODERM CQ - dosed in mg/24 hours) patch 14 mg  14 mg Transdermal  Daily Nena Polio, PA-C   14 mg at 01/27/14 0810  . ondansetron (ZOFRAN-ODT) disintegrating tablet 4 mg  4 mg Oral Q6H PRN Nena Polio, PA-C   4 mg at 01/24/14 0641  . pantoprazole (PROTONIX) EC tablet 40 mg  40 mg Oral BID AC Neil Mashburn, PA-C   40 mg at 01/27/14 0700  . thiamine (B-1) injection 100 mg  100 mg Intramuscular Once Charlcie Cradle, MD      . thiamine (VITAMIN B-1) tablet 100 mg  100 mg Oral Daily Charlcie Cradle, MD   100 mg at 01/27/14 0741  . traZODone (DESYREL) tablet 50 mg  50 mg Oral QHS,MR X 1 Laverle Hobby, PA-C   50 mg at 01/26/14 2257    Lab Results:  Results for orders placed or performed during the hospital encounter of 01/22/14 (from the past 48 hour(s))  Magnesium     Status: None   Collection Time: 01/26/14  6:33 AM  Result Value Ref Range   Magnesium 2.4 1.5 - 2.5 mg/dL    Comment: Performed at [facility]  Potassium     Status: Abnormal   Collection Time: 01/26/14  6:33 AM  Result Value Ref Range   Potassium 3.6 (L) 3.7 - 5.3 mEq/L    Comment: Performed at [facility]  Magnesium     Status: None   Collection Time: 01/27/14  6:25 AM  Result Value Ref Range   Magnesium 2.2 1.5 - 2.5 mg/dL    Comment: Performed at [facility]  Potassium     Status: Abnormal   Collection Time: 01/27/14  6:25 AM  Result Value Ref Range   Potassium 3.6 (L) 3.7 - 5.3 mEq/L    Comment: Performed at [facility]    Physical Findings: AIMS: Facial and Oral Movements Muscles of Facial Expression: None, normal Lips and Perioral Area: None, normal Jaw: None, normal Tongue: None, normal,Extremity Movements Upper (arms, wrists, hands, fingers): None, normal Lower (legs, knees, ankles, toes): None, normal, Trunk Movements Neck, shoulders, hips: None, normal, Overall Severity Severity of abnormal movements (highest score from questions above): None, normal Incapacitation due to abnormal movements:  None, normal Patient's awareness of abnormal movements (rate only patient's report): No Awareness, Dental Status Current problems with teeth and/or dentures?: No Does patient usually wear dentures?: No  CIWA:  CIWA-Ar Total: 1 COWS:  COWS Total Score: 2   Assessment:  Patient is improving and today seems less anxious, less irritable, and a little less focused on pain. The uncomfortable " skin crawling" dysesthesias he reported yesterday have improved, and may have been related to cymbalta trial, which was stopped. He seems to be tolerating other medications well. He is starting to become more future oriented and today wanted to discuss disposition plans. Today he  does not appear to be in any discomfort or distress- he does report chronic pain contributing to his substance dependence and depression. No significant withdrawal symptoms at present.   Treatment Plan Summary: Daily contact with patient to assess and evaluate symptoms and progress in treatment Medication management See below  Plan: Continue inpatient treatment /milieu Continue Clonidine taper as per opiate detox protocol Continue  Neurontin   600  mgrs TID Increase Remeron 15  mgrs QHS Ativan 0.5 mgrs TID - patient is aware this medication will be stopped prior to discharge- was started yesterday due to significant anxiety. Trazodone 50 mgrs QHS PRN Insomnia  Consider Discharge Soon as he continues to improve      Medical Decision Making Problem Points:  Established problem, worsening (2), Review of last therapy session (1) and Review of psycho-social stressors (1) Data Points:  Review or order clinical lab tests (1) Review of medication regiment & side effects (2) Review of new medications or change in dosage (2)  I certify that inpatient services furnished can reasonably be expected to improve the patient's condition.   COBOS, [place]o 01/27/2014, 2:01 PM

## 2014-01-27 NOTE — Progress Notes (Signed)
D   Pt continues to request medications as frequently as possible   He is anxious and fidgety   He did attend group and interacts appropriately with staff and peers A   Verbal support given   Medications administered and effectiveness monitored    Q 15 min checks R   Pt safe at present and was receptive to verbal support

## 2014-01-27 NOTE — Progress Notes (Signed)
NSG shift assessment. 7a-7p.   D: Affect blunted, mood depressed and anxious. Complains of chronic pain in lower extremities and poor sleep, stating that he had, "No sleep last night."  He frequently request medication for his chronic pain with very little relief. His energy level is low and he has poor concentration. His depression is rated at 7 with hopelessness being 8 and his anxiety is rated a 10, on a scale of 0 to 10.  He continues to assert that he is withdrawing from drugs and alcohol.    A: Observed pt interacting in group and in the milieu: Support and encouragement offered. Safety maintained with observations every 15 minutes.   R:  Contracts for safety and continues to follow the treatment plan, working on learning new coping skills.

## 2014-01-27 NOTE — [facility] Group Notes (Signed)
[place] LCSW Group Therapy 01/27/2014  1:15 PM   Type of Therapy: Group Therapy  Participation Level: Did Not Attend- patient in bed.   Tilden Fossa, MSW, [place] Worker [facility] [phone]

## 2014-01-27 NOTE — Progress Notes (Signed)
Pt attended NA group this evening.  

## 2014-01-27 NOTE — Progress Notes (Signed)
Recreation Therapy Notes  Animal-Assisted Activity/Therapy (AAA/T) Program Checklist/Progress Notes Patient Eligibility Criteria Checklist & Daily Group note for Rec Tx Intervention  Date: 11.19.2015 Time: 2:45pm Location: [address]    AAA/T Program Assumption of Risk Form signed by Patient/ or Parent Legal Guardian yes  Patient is free of allergies or sever asthma yes  Patient reports no fear of animals yes  Patient reports no history of cruelty to animals yes   Patient understands his/her participation is voluntary yes  Patient washes hands before animal contact yes  Patient washes hands after animal contact yes  Behavioral Response: Appropriate   Education: Hand Washing, Appropriate Animal Interaction   Education Outcome: Acknowledges education.   Clinical Observations/Feedback: Patient engaged in session, petting therapy dog appropriately and sharing stories about pets he has had in the past. Patient left session at approximately 3:10pm, patient did not return to session.   Laureen Ochs Mark [name], LRT/CTRS  Mark [name] 01/27/2014 4:18 PM

## 2014-01-28 DIAGNOSIS — F1020 Alcohol dependence, uncomplicated: Secondary | ICD-10-CM | POA: Insufficient documentation

## 2014-01-28 LAB — MAGNESIUM: Magnesium: 2.5 mg/dL (ref 1.5–2.5)

## 2014-01-28 LAB — POTASSIUM: POTASSIUM: 4.1 meq/L (ref 3.7–5.3)

## 2014-01-28 MED ORDER — NICOTINE 14 MG/24HR TD PT24: 14.0000 mg | MEDICATED_PATCH | Freq: Every day | TRANSDERMAL | Status: DC

## 2014-01-28 MED ORDER — LOSARTAN POTASSIUM 100 MG PO TABS: 100.0000 mg | ORAL_TABLET | Freq: Every day | ORAL | Status: DC

## 2014-01-28 MED ORDER — CLONIDINE HCL 0.1 MG PO TABS: 0.1000 mg | ORAL_TABLET | Freq: Every day | ORAL | Status: DC

## 2014-01-28 MED ORDER — THIAMINE HCL 100 MG PO TABS: 100.0000 mg | ORAL_TABLET | Freq: Every day | ORAL | Status: DC

## 2014-01-28 MED ORDER — MIRTAZAPINE 15 MG PO TABS: 15.0000 mg | ORAL_TABLET | Freq: Every day | ORAL | Status: DC

## 2014-01-28 MED ORDER — LIDOCAINE 5 % EX PTCH: 1.0000 | MEDICATED_PATCH | CUTANEOUS | Status: DC

## 2014-01-28 MED ORDER — METOPROLOL TARTRATE 50 MG PO TABS: 50.0000 mg | ORAL_TABLET | Freq: Two times a day (BID) | ORAL | Status: DC

## 2014-01-28 MED ORDER — MELOXICAM 15 MG PO TABS: 15.0000 mg | ORAL_TABLET | Freq: Every day | ORAL | Status: DC

## 2014-01-28 MED ORDER — LORAZEPAM 0.5 MG PO TABS: 0.5000 mg | ORAL_TABLET | Freq: Three times a day (TID) | ORAL | Status: DC

## 2014-01-28 MED ORDER — TRAZODONE HCL 50 MG PO TABS: 50.0000 mg | ORAL_TABLET | Freq: Every evening | ORAL | Status: DC | PRN

## 2014-01-28 MED ORDER — PANTOPRAZOLE SODIUM 40 MG PO TBEC: 40.0000 mg | DELAYED_RELEASE_TABLET | Freq: Two times a day (BID) | ORAL | Status: DC

## 2014-01-28 MED ORDER — ALBUTEROL SULFATE HFA 108 (90 BASE) MCG/ACT IN AERS: 2.0000 | INHALATION_SPRAY | RESPIRATORY_TRACT | Status: DC | PRN

## 2014-01-28 MED ORDER — GABAPENTIN 300 MG PO CAPS: 600.0000 mg | ORAL_CAPSULE | Freq: Three times a day (TID) | ORAL | Status: DC

## 2014-01-28 MED ORDER — HYDROXYZINE HCL 50 MG PO TABS: 50.0000 mg | ORAL_TABLET | Freq: Three times a day (TID) | ORAL | Status: DC | PRN

## 2014-01-28 NOTE — Progress Notes (Signed)
D: Patient was alert and oriented upon discharge, and ambulatory with a steady gait. Pt's BP was 151/92, pulse 95. Pt reports chronic BP. Pt denies SI/HI/AVH. A: AVS reviewed and given to pt. Follow up reviewed. Medications/Prescriptions given to pt. Belongings returned to pt. Resources reviewed, including NAMI. Patient was given time to express concerns and ask questions. Pt was encouraged to discuss BP concerns at follow up appointment. R: Pt D/C'd to family member.

## 2014-01-28 NOTE — Discharge Summary (Signed)
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 Judgment and thought content normal. His mood appears not anxious. His affect is not angry, not blunt, not labile and not inappropriate. Cognition and memory are normal. He does not exhibit a depressed mood.    Review of Systems  Constitutional: Negative.   HENT: Negative.   Eyes: Negative.   Respiratory: Negative.   Cardiovascular: Negative.   Gastrointestinal: Negative.   Genitourinary: Negative.   Musculoskeletal: Negative.   Skin: Negative.   Neurological: Negative.   Endo/Heme/Allergies: Negative.   Psychiatric/Behavioral: Positive for depression (Stable) and substance abuse (Alcoholism, chronic, opioid dependence). Negative for suicidal ideas, hallucinations and memory loss. The patient is nervous/anxious (Stabilized with medication prior top discharge) and has insomnia (Stable).                        Blood pressure 136/104, pulse 93, temperature 97.8 F (36.6 C), temperature source Oral, resp. rate 20, height 6\' 4"  (1.93 m), weight 90.946 kg (200 lb 8 oz), SpO2 98 %.Body mass index is 24.42 kg/(m^2).  General Appearance: improved grooming  Eye Contact:: Good  Speech: Normal Rate  Volume: Normal  Mood: improved and  today euthymic  Affect: Appropriate and Full Range  Thought Process: Goal Directed and Linear  Orientation: Full (Time, Place, and Person)  Thought Content: no hallucinations and no delusions  Suicidal Thoughts: No at this time denies any thoughts of hurting self and contracts for safety on unit   Homicidal Thoughts: No  Memory: Recent and Remote grossly intact  Judgement: Other: improved   Insight: Fair  Psychomotor Activity: Normal  Concentration: Good  Recall: [place] of Knowledge:Good  Language: Good  Akathisia: Negative  Handed: Right  AIMS (if indicated):    Assets: Desire for Improvement Resilience  Sleep: Number of Hours: 6.25   Past Psychiatric History: Diagnosis: Alcohol dpendence  Hospitalizations: [facility] 2014, [facility] 2011  Outpatient Care: Dr. 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 Hypertension   . Hyperlipidemia   . 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 ETOH abuse   . GERD (gastroesophageal reflux disease)   . Mental disorder   . Irregular heart beat   . Anxiety   . Shortness of breath   .  Headache(784.0)   . Hepatitis   . Withdrawal seizures   . Alcohol withdrawal   . Neuropathy 06/07/2013  . 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 Seizure due to alcohol withdrawal 01/23/2014    Per patient report   AXIS IV:  other  psychosocial or environmental problems AXIS V:  63  Level of Care:  OP  Hospital Course: Mark [name] is a 48 year old separated WM who presented to the ED requesting assistance with alcohol withdrawal. He began drinking daily 2 months ago to help with his withdrawal symptoms from opiates. He had been taking opiate pain medication for chronic joint and chest pain until he started abusing it. When he ran out of medication early he started drinking to ease the withdrawal symptoms.  He notes that he has been drinking liquor dailey from 1/5th to 1/2 gallon a day for the past two months. He has a history of withdrawal seizures per the patient and reports several. 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  Mark [name] was admitted to the hospital with his toxicology tests results showing BAL of 157 and UDS test results positive for Benzodiaazepine. He reports having been drinking liquor, a fifth to a gallon daily to ease his opioid withdrawal syndrome. He was here for alcohol/drug detox. His detoxification treatment was achieved using Ativan detox regimen on a tapering dose format. This was used with a brief Clonidine detox regimen to combat any opioid withdrawal symptoms that Mark [name] might encounter. This way, Mark [name] received a cleaner detoxification treatment without the lingering sides effects of the long acting Librium capsules. He was also enrolled in the group counseling sessions, AA/NA meetings being offered and held on this unit. He participated and learned coping skills. Mark [name] received other medication regimen for his other pre-existing medical conditions presented. He tolerated his treatment regimen without any significant adverse effects and or reactions.  Besides the treatments received here and scheduled outpatient psychiatric services, Mark [name] also was ordered, received and discharged on Trazodone 50 mg at bedtime for sleep, Hydroxyzine 50  mg for anxiety, Gabapentin 300 mg for substance withdrawal syndrome, mirtazapine 15 mg Q bedtime for depression and Lorazepam 1 mg prn for anxiety. He received other medication management for his other pre-existing medication issues presented.   Mark [name] has completed detox treatment and his mood is stable. This is evidenced by his reports of improved mood and absence of substance withdrawal symptoms. He will resume psychiatric care and routine medication management at the [facility]. Patient was encouraged to join/attend AA/NA meetings being offered and held within his community as well.   Upon discharge, Pesach adamantly denies any suicidal, homicidal ideations, auditory, visual hallucinations, delusional thoughts, paranoia and or withdrawal symptoms. He left [facility] with all personal belongings in no apparent distress. He received 14 days worth supply samples of his [facility] discharge medications provided by [facility] pharmacy. Transportation per patient's arrangement.  Consults:  psychiatry  Significant Diagnostic Studies:  labs: CBC with diff, CMP, UDS, Toxicology tests, U/A, results reviewed, no changes  Discharge Vitals:   Blood pressure 151/92, pulse 95, temperature 97.8 F (36.6 C), temperature source Oral, resp. rate 20, height 6\' 4"  (1.93 m), weight 90.946 kg (200 lb 8 oz), SpO2 98 %. Body mass index is 24.42 kg/(m^2). Lab Results:   Results for orders placed or performed during the hospital encounter of 01/22/14 (from the past 72 hour(s))  Magnesium     Status: None  Collection Time: 01/26/14  6:33 AM  Result Value Ref Range   Magnesium 2.4 1.5 - 2.5 mg/dL    Comment: Performed at [facility]  Potassium     Status: Abnormal   Collection Time: 01/26/14  6:33 AM  Result Value Ref Range   Potassium 3.6 (L) 3.7 - 5.3 mEq/L    Comment: Performed at [facility]  Magnesium     Status: None   Collection Time: 01/27/14  6:25 AM  Result Value Ref  Range   Magnesium 2.2 1.5 - 2.5 mg/dL    Comment: Performed at [facility]  Potassium     Status: Abnormal   Collection Time: 01/27/14  6:25 AM  Result Value Ref Range   Potassium 3.6 (L) 3.7 - 5.3 mEq/L    Comment: Performed at [facility]  Magnesium     Status: None   Collection Time: 01/28/14  6:13 AM  Result Value Ref Range   Magnesium 2.5 1.5 - 2.5 mg/dL    Comment: Performed at [facility]  Potassium     Status: None   Collection Time: 01/28/14  6:13 AM  Result Value Ref Range   Potassium 4.1 3.7 - 5.3 mEq/L    Comment: Performed at [facility]    Physical Findings: AIMS: Facial and Oral Movements Muscles of Facial Expression: None, normal Lips and Perioral Area: None, normal Jaw: None, normal Tongue: None, normal,Extremity Movements Upper (arms, wrists, hands, fingers): None, normal Lower (legs, knees, ankles, toes): None, normal, Trunk Movements Neck, shoulders, hips: None, normal, Overall Severity Severity of abnormal movements (highest score from questions above): None, normal Incapacitation due to abnormal movements: None, normal Patient's awareness of abnormal movements (rate only patient's report): No Awareness, Dental Status Current problems with teeth and/or dentures?: No Does patient usually wear dentures?: No  CIWA:  CIWA-Ar Total: 1 COWS:  COWS Total Score: 3  Psychiatric Specialty Exam: See Psychiatric Specialty Exam and Suicide Risk Assessment completed by Attending Physician prior to discharge.  Discharge destination:  Home  Is patient on multiple antipsychotic therapies at discharge:  No   Has Patient had three or more failed trials of antipsychotic monotherapy by history:  No  Recommended Plan for Multiple Antipsychotic Therapies: NA      Discharge Instructions    Diet - low sodium heart healthy    Complete by:  As directed             Medication List    STOP  taking these medications        cyclobenzaprine 10 MG tablet  Commonly known as:  FLEXERIL     FOLIC ACID PO     hydrOXYzine 25 MG capsule  Commonly known as:  VISTARIL     oxyCODONE 5 MG immediate release tablet  Commonly known as:  Oxy IR/ROXICODONE     pregabalin 100 MG capsule  Commonly known as:  LYRICA      TAKE these medications      Indication   albuterol 108 (90 BASE) MCG/ACT inhaler  Commonly known as:  PROVENTIL HFA;VENTOLIN HFA  Inhale 2 puffs into the lungs every 4 (four) hours as needed for wheezing or shortness of breath.   Indication:  Asthma     cloNIDine 0.1 MG tablet  Commonly known as:  CATAPRES  Take 1 tablet (0.1 mg total) by mouth daily before breakfast. x1 more dose then stop   Indication:  Codeine/Morphine-Like  Drug Withdrawal Syndrome     gabapentin 300 MG capsule  Commonly known as:  NEURONTIN  Take 2 capsules (600 mg total) by mouth 3 (three) times daily. For alcohol withdrawal syndrome   Indication:  Alcohol Withdrawal Syndrome     hydrOXYzine 50 MG tablet  Commonly known as:  ATARAX/VISTARIL  Take 1 tablet (50 mg total) by mouth every 8 (eight) hours as needed for anxiety.   Indication:  Tension, Anxiety     lidocaine 5 %  Commonly known as:  LIDODERM  Place 1 patch onto the skin daily. Remove & Discard patch within 12 hours or as directed by MD: For pain management   Indication:  Pain management     LORazepam 0.5 MG tablet  Commonly known as:  ATIVAN  Take 1 tablet (0.5 mg total) by mouth 3 (three) times daily. For anxiety   Indication:  Feeling Anxious     losartan 100 MG tablet  Commonly known as:  COZAAR  Take 1 tablet (100 mg total) by mouth daily. For high blood pressure   Indication:  High Blood Pressure     meloxicam 15 MG tablet  Commonly known as:  MOBIC  Take 1 tablet (15 mg total) by mouth daily. For arthritic pain   Indication:  Joint Damage causing Pain and Loss of Function     metoprolol 50 MG tablet  Commonly  known as:  LOPRESSOR  Take 1 tablet (50 mg total) by mouth 2 (two) times daily. For high blood pressure   Indication:  High Blood Pressure     mirtazapine 15 MG tablet  Commonly known as:  REMERON  Take 1 tablet (15 mg total) by mouth at bedtime. For depression/insomnia   Indication:  Trouble Sleeping, Major Depressive Disorder     nicotine 14 mg/24hr patch  Commonly known as:  NICODERM CQ - dosed in mg/24 hours  Place 1 patch (14 mg total) onto the skin daily. For nicotine addiction   Indication:  Nicotine Addiction     pantoprazole 40 MG tablet  Commonly known as:  PROTONIX  Take 1 tablet (40 mg total) by mouth 2 (two) times daily before a meal. For acid reflux   Indication:  Gastroesophageal Reflux Disease     thiamine 100 MG tablet  Take 1 tablet (100 mg total) by mouth daily. For low thiamine   Indication:  Low thiamine     traZODone 50 MG tablet  Commonly known as:  DESYREL  Take 1 tablet (50 mg total) by mouth at bedtime and may repeat dose one time if needed. For sleep   Indication:  Trouble Sleeping       Follow-up Information    Follow up with [facility] On 01/28/2014.   Specialty:  Professional Counselor   Why:  Please present to walk-in clinic today or any day Monday-Friday between 8 am-12 pm and 1 - 2:30 pm for assessment for therapy and medication management services.   Contact information:   [facility] [place] [address] [phone]      Follow-up recommendations:  Activity:  As tolerated Diet: As recommended by your primary care doctor. Keep all scheduled follow-up appointments as recommended.  Comments: Take all your medications as prescribed by your mental healthcare provider. Report any adverse effects and or reactions from your medicines to your outpatient provider promptly. Patient is instructed and cautioned to not engage in alcohol and or illegal drug use  while on  prescription medicines. In the event of worsening symptoms, patient is instructed to call the crisis hotline, 911 and or go to the nearest ED for appropriate evaluation and treatment of symptoms. Follow-up with your primary care provider for your other medical issues, concerns and or health care needs.   Total Discharge Time:  Greater than 30 minutes.  Signed: Encarnacion Slates, PMHNP, FNP-BC 01/28/2014, 3:20 PM   Patient seen, Suicide Assessment Completed.  Disposition Plan Reviewed

## 2014-01-28 NOTE — Tx Team (Signed)
Interdisciplinary Treatment Plan Update (Adult) Date: 01/28/2014   Time Reviewed: 9:30 AM  Progress in Treatment: Attending groups: Minimally Participating in groups: Yes, when he attends Taking medication as prescribed: Yes Tolerating medication: Yes Family/Significant other contact made: No, Patient has declined for CSW to make collateral contact Patient understands diagnosis: Yes Discussing patient identified problems/goals with staff: Yes Medical problems stabilized or resolved: Yes Denies suicidal/homicidal ideation: Yes, denies Issues/concerns per patient self-inventory: Yes Other:  New problem(s) identified: N/A  Discharge Plan or Barriers: Patient plans to return home to follow up with [facility] [place] for therapy and medication management services. Patient complaining of chronic pain. Patient has been advised to follow up with his PCP at [facility] and [facility].  Reason for Continuation of Hospitalization:  Depression Anxiety Medication Stabilization   Comments: N/A  Estimated length of stay: Discharge anticipated for today 01/28/14.  For review of initial/current patient goals, please see plan of care. Patient is 48 YO separated unemployed caucasian male admitted with diagnosis of Substance Induced Mood Disorder, Alcohol Dependence Severe and Opoid Dependence. Patient would benefit from crisis stabilization, medication evaluation, therapy groups for processing thoughts/feelings/experiences, psycho ed groups for increasing coping skills, and aftercare planning. Attendees: Patient:    Family:    Physician: Dr. Parke Poisson 01/28/2014 9:30 AM  Nursing: Eduard Roux, Charlyne Quale RN 01/28/2014 9:30 AM  Clinical Social Worker: Tilden Fossa,  LCSWA 01/28/2014 9:30 AM  Other: Joette Catching, LCSW 01/28/2014 9:30 AM  Other: Lucinda Dell, Beverly Sessions Liaison 01/28/2014 9:30 AM  Other:  Ermalinda Barrios, Pharmacist 01/28/2014 9:30 AM  Other:  01/28/2014 9:30 AM   Other:    Other:    Other:    Other:    Other:      Scribe for Treatment Team:  Tilden Fossa, MSW, [employer] [phone]

## 2014-01-28 NOTE — Progress Notes (Signed)
[facility] Adult Case Management Discharge Plan :  Will you be returning to the same living situation after discharge: Yes,  patient will return home  At discharge, do you have transportation home?:Yes,  patient reports that he will have transportation home Do you have the ability to pay for your medications:Yes,  patient will be provided medication samples and prescriptions at discharge  Release of information consent forms completed and in the chart;  Patient's signature needed at discharge.  Patient to Follow up at: Follow-up Information    Follow up with [place] On 01/28/2014.   Specialty:  Professional Counselor   Why:  Please present to walk-in clinic today or any day Monday-Friday between 8 am-12 pm and 1 - 2:30 pm for assessment for therapy and medication management services.   Contact information:   [facility] [place] [address] [phone]       Patient denies SI/HI:   Yes,  denies    Safety Planning and Suicide Prevention discussed:  Yes,  with patient  Mark [name], Mark [name] 01/28/2014, 11:25 AM

## 2014-01-28 NOTE — [facility] Group Notes (Signed)
   [facility] LCSW Aftercare Discharge Planning Group Note  01/28/2014  8:45 AM   Participation Quality: Alert, Appropriate and Oriented  Mood/Affect: Appropriate   Depression Rating: 5  Anxiety Rating: 5  Thoughts of Suicide: Pt denies SI/HI  Will you contract for safety? Yes  Current AVH: Pt denies  Plan for Discharge/Comments: Pt attended discharge planning group and actively participated in group. CSW provided pt with today's workbook. Patient reports feeling "pretty good" today and feels ready to discharge home. Patient denies SI/HI and plans to follow up with [facility] the [place] for outpatient services.  Transportation Means: Pt reports access to transportation  Supports: No supports mentioned at this time  Tilden Fossa, MSW, [place] Social Worker [employer] [phone]

## 2014-01-28 NOTE — [facility] Group Notes (Signed)
[place] Group Notes:  activities  Date:  01/28/2014  Time:  10:55 AM  Type of Therapy:  Psychoeducational Skills  Participation Level:  Active  Participation Quality:  Appropriate  Affect:  Appropriate  Cognitive:  Appropriate  Insight:  Appropriate  Engagement in Group:  Engaged  Modes of Intervention:  Discussion  Summary of Progress/Problems:  Mark [name] 01/28/2014, 10:55 AM

## 2014-01-28 NOTE — Clinical Social Work Note (Signed)
CSW met with patient to provide information on financial resources. Patient thanked CSW for assistance.  Tilden Fossa, MSW, [place] Worker [facility] [phone]

## 2014-01-28 NOTE — Progress Notes (Signed)
D: Patient is alert and oriented. Pt's mood and affect is anxious and pleasant. Pt denies SI/HI and AVH. Pt reports his depression and hopelessness 5/10 and anxiety 7/10. Pt reports his goal for the day is "going home." A: Scheduled medications administered per providers orders (See MAR). 15 minute checks completed per protocol for pt safety. R: Pt cooperative and receptive to nursing interventions.

## 2014-01-28 NOTE — [facility] Suicide Risk Assessment (Signed)
Demographic Factors:  48 year old caucasian male, lives with mother, currently unemployed  Total Time spent with patient: 30 minutes  Psychiatric Specialty Exam: Physical Exam  ROS  Blood pressure 136/104, pulse 93, temperature 97.8 F (36.6 C), temperature source Oral, resp. rate 20, height 6\' 4"  (1.93 m), weight 90.946 kg (200 lb 8 oz), SpO2 98 %.Body mass index is 24.42 kg/(m^2).  General Appearance: improved grooming  Eye Contact::  Good  Speech:  Normal Rate  Volume:  Normal  Mood:  improved and today euthymic  Affect:  Appropriate and Full Range  Thought Process:  Goal Directed and Linear  Orientation:  Full (Time, Place, and Person)  Thought Content:  no hallucinations and no delusions  Suicidal Thoughts:  No at this time denies any thoughts of hurting self and  contracts for safety on unit   Homicidal Thoughts:  No  Memory:  Recent and Remote grossly intact  Judgement:  Other:  improved   Insight:  Fair  Psychomotor Activity:  Normal  Concentration:  Good  Recall:  Good  Fund of Knowledge:Good  Language: Good  Akathisia:  Negative  Handed:  Right  AIMS (if indicated):     Assets:  Desire for Improvement Resilience  Sleep:  Number of Hours: 6.25    Musculoskeletal: Strength & Muscle Tone: within normal limits Gait & Station: normal Patient leans: N/A   Mental Status Per Nursing Assessment::   On Admission:     Current Mental Status by Physician: Upon discharge patient is improved- he is presenting with better grooming, calm, much improved mood and affect, no thought disorder, no suicidal or homicidal ideations and no psychotic symptoms. He is not presenting with any withdrawal symptoms upon discharge. He is future oriented  Loss Factors: Chronic pain, unemployment, separation- limited support network  Historical Factors: History of alcohol dependence, history of alcohol abuse, history of chronic pain, history of depression  Risk Reduction Factors:    Living with another person, especially a relative and Positive coping skills or problem solving skills  Continued Clinical Symptoms:  Upon discharge patient is improved. He does continue to report chronic pain issues, which he describes as chronic, but upon discharge appears calm and in no acute distress. He is not currently presenting with withdrawal symptoms. He is not suicidal , not homicidal , not psychotic, and is future oriented . Recent symptoms of increased anxiety and unpleasant dysesthesias have resolved- they may have been related to either cymbalta, which was stopped, after which patient's symptoms improved, or to paradoxical reaction to Vistaril . *Of note, prolonged QTc improved in repeat EKG. Had no cardiac symptoms during admission.  Cognitive Features That Contribute To Risk:  No gross cognitive deficits noted upon discharge. Is alert , attentive, and oriented x 3    Suicide Risk:  Mild:  Suicidal ideation of limited frequency, intensity, duration, and specificity.  There are no identifiable plans, no associated intent, mild dysphoria and related symptoms, good self-control (both objective and subjective assessment), few other risk factors, and identifiable protective factors, including available and accessible social support.  Discharge Diagnoses:   AXIS I: Alcohol Dependence, Opiate Dependence, Substance Induced Mood Disorder versus Depression secondary to Chronic Pain AXIS II:  Deferred AXIS III:   Past Medical History  Diagnosis Date  . Hypertension   . Hyperlipidemia   . 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 ETOH abuse   . GERD (gastroesophageal reflux disease)   .  Mental disorder   . Irregular heart beat   . Anxiety   . Shortness of breath   . Headache(784.0)   . Hepatitis   . Withdrawal seizures   . Alcohol withdrawal   . Neuropathy 06/07/2013  . 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 Seizure due to alcohol withdrawal  01/23/2014    Per patient report   AXIS IV:  economic problems and occupational problems AXIS V:  60-65 upon disccharge  Plan Of Care/Follow-up recommendations:  Activity:  As tolerated Diet:  Low sodium and heart healthy Tests:  NA Other:  see below  Is patient on multiple antipsychotic therapies at discharge:  No   Has Patient had three or more failed trials of antipsychotic monotherapy by history:  No  Recommended Plan for Multiple Antipsychotic Therapies: NA  Patient is leaving unit in good spirits. He plans to return home - lives with mother. He will follow up at [facility] for ongoing outpatient management. Plans to continue seeing PCP for medical issues ( at [facility])  States he plans on establishing treatment at a local pain clinic to address chronic pain issues.  COBOS, FERNANDO 01/28/2014, 10:57 AM

## 2014-02-01 NOTE — Progress Notes (Signed)
Patient Discharge Instructions:  After Visit Summary (AVS):   Faxed to:  02/01/14 Discharge Summary Note:   Faxed to:  02/01/14 Psychiatric Admission Assessment Note:   Faxed to:  02/01/14 Suicide Risk Assessment - Discharge Assessment:   Faxed to:  02/01/14 Faxed/Sent to the Next Level Care provider:  02/01/14 Faxed to [place] [facility] [place], 02/01/2014, 3:58 PM

## 2014-02-02 ENCOUNTER — Encounter (HOSPITAL_COMMUNITY): Payer: Self-pay | Admitting: *Deleted

## 2014-02-02 ENCOUNTER — Encounter (HOSPITAL_COMMUNITY): Payer: Self-pay | Admitting: Emergency Medicine

## 2014-02-02 ENCOUNTER — Ambulatory Visit (HOSPITAL_COMMUNITY)
Admission: RE | Admit: 2014-02-02 | Discharge: 2014-02-02 | Disposition: A | Discharge status: Home / Self Care | Payer: Self-pay | Attending: Psychiatry | Admitting: Psychiatry

## 2014-02-02 ENCOUNTER — Observation Stay (HOSPITAL_COMMUNITY)
Admission: AD | Admit: 2014-02-02 | Discharge: 2014-02-02 | Disposition: A | Discharge status: Home / Self Care | Payer: MEDICAID | Source: Intra-hospital | Attending: Psychiatry | Admitting: Psychiatry

## 2014-02-02 ENCOUNTER — Emergency Department (HOSPITAL_COMMUNITY)
Admission: EM | Admit: 2014-02-02 | Discharge: 2014-02-02 | Disposition: A | Discharge status: Home / Self Care | Payer: MEDICAID | Attending: Emergency Medicine | Admitting: Emergency Medicine

## 2014-02-02 DIAGNOSIS — G629 Polyneuropathy, unspecified: Secondary | ICD-10-CM | POA: Insufficient documentation

## 2014-02-02 DIAGNOSIS — K219 Gastro-esophageal reflux disease without esophagitis: Secondary | ICD-10-CM | POA: Insufficient documentation

## 2014-02-02 DIAGNOSIS — F1110 Opioid abuse, uncomplicated: Secondary | ICD-10-CM | POA: Insufficient documentation

## 2014-02-02 DIAGNOSIS — F1020 Alcohol dependence, uncomplicated: Secondary | ICD-10-CM

## 2014-02-02 DIAGNOSIS — I10 Essential (primary) hypertension: Secondary | ICD-10-CM | POA: Insufficient documentation

## 2014-02-02 DIAGNOSIS — G8929 Other chronic pain: Secondary | ICD-10-CM | POA: Insufficient documentation

## 2014-02-02 DIAGNOSIS — Z720 Tobacco use: Secondary | ICD-10-CM | POA: Insufficient documentation

## 2014-02-02 DIAGNOSIS — Z791 Long term (current) use of non-steroidal anti-inflammatories (NSAID): Secondary | ICD-10-CM | POA: Insufficient documentation

## 2014-02-02 DIAGNOSIS — F419 Anxiety disorder, unspecified: Secondary | ICD-10-CM | POA: Insufficient documentation

## 2014-02-02 DIAGNOSIS — F329 Major depressive disorder, single episode, unspecified: Secondary | ICD-10-CM | POA: Insufficient documentation

## 2014-02-02 DIAGNOSIS — I4581 Long QT syndrome: Secondary | ICD-10-CM | POA: Insufficient documentation

## 2014-02-02 DIAGNOSIS — F99 Mental disorder, not otherwise specified: Secondary | ICD-10-CM | POA: Insufficient documentation

## 2014-02-02 DIAGNOSIS — M549 Dorsalgia, unspecified: Secondary | ICD-10-CM | POA: Insufficient documentation

## 2014-02-02 DIAGNOSIS — F1024 Alcohol dependence with alcohol-induced mood disorder: Secondary | ICD-10-CM | POA: Insufficient documentation

## 2014-02-02 DIAGNOSIS — E785 Hyperlipidemia, unspecified: Secondary | ICD-10-CM | POA: Insufficient documentation

## 2014-02-02 DIAGNOSIS — G40909 Epilepsy, unspecified, not intractable, without status epilepticus: Secondary | ICD-10-CM | POA: Insufficient documentation

## 2014-02-02 DIAGNOSIS — F1010 Alcohol abuse, uncomplicated: Secondary | ICD-10-CM | POA: Diagnosis present

## 2014-02-02 DIAGNOSIS — Z79899 Other long term (current) drug therapy: Secondary | ICD-10-CM | POA: Insufficient documentation

## 2014-02-02 DIAGNOSIS — Z888 Allergy status to other drugs, medicaments and biological substances status: Secondary | ICD-10-CM | POA: Insufficient documentation

## 2014-02-02 DIAGNOSIS — K759 Inflammatory liver disease, unspecified: Secondary | ICD-10-CM | POA: Insufficient documentation

## 2014-02-02 DIAGNOSIS — F10239 Alcohol dependence with withdrawal, unspecified: Principal | ICD-10-CM | POA: Insufficient documentation

## 2014-02-02 DIAGNOSIS — F17210 Nicotine dependence, cigarettes, uncomplicated: Secondary | ICD-10-CM | POA: Insufficient documentation

## 2014-02-02 DIAGNOSIS — R0602 Shortness of breath: Secondary | ICD-10-CM | POA: Insufficient documentation

## 2014-02-02 DIAGNOSIS — I499 Cardiac arrhythmia, unspecified: Secondary | ICD-10-CM | POA: Insufficient documentation

## 2014-02-02 DIAGNOSIS — G4089 Other seizures: Secondary | ICD-10-CM | POA: Insufficient documentation

## 2014-02-02 DIAGNOSIS — G479 Sleep disorder, unspecified: Secondary | ICD-10-CM | POA: Insufficient documentation

## 2014-02-02 DIAGNOSIS — F1120 Opioid dependence, uncomplicated: Secondary | ICD-10-CM

## 2014-02-02 DIAGNOSIS — R51 Headache: Secondary | ICD-10-CM | POA: Insufficient documentation

## 2014-02-02 DIAGNOSIS — F339 Major depressive disorder, recurrent, unspecified: Secondary | ICD-10-CM | POA: Insufficient documentation

## 2014-02-02 LAB — CBC WITH DIFFERENTIAL/PLATELET
Basophils Absolute: 0.1 K/uL (ref 0.0–0.1)
Basophils Relative: 1 % (ref 0–1)
Eosinophils Absolute: 0.3 K/uL (ref 0.0–0.7)
Eosinophils Relative: 3 % (ref 0–5)
HCT: 45.8 % (ref 39.0–52.0)
Hemoglobin: 15.9 g/dL (ref 13.0–17.0)
Lymphocytes Relative: 29 % (ref 12–46)
Lymphs Abs: 3 K/uL (ref 0.7–4.0)
MCH: 27.4 pg (ref 26.0–34.0)
MCHC: 34.7 g/dL (ref 30.0–36.0)
MCV: 78.8 fL (ref 78.0–100.0)
Monocytes Absolute: 0.6 K/uL (ref 0.1–1.0)
Monocytes Relative: 5 % (ref 3–12)
Neutro Abs: 6.6 K/uL (ref 1.7–7.7)
Neutrophils Relative %: 62 % (ref 43–77)
Platelets: 340 K/uL (ref 150–400)
RBC: 5.81 MIL/uL (ref 4.22–5.81)
RDW: 13.7 % (ref 11.5–15.5)
WBC: 10.6 K/uL — ABNORMAL HIGH (ref 4.0–10.5)

## 2014-02-02 LAB — RAPID URINE DRUG SCREEN, HOSP PERFORMED
Amphetamines: NOT DETECTED
Barbiturates: NOT DETECTED
Benzodiazepines: NOT DETECTED
Cocaine: NOT DETECTED
Opiates: NOT DETECTED
Tetrahydrocannabinol: NOT DETECTED

## 2014-02-02 LAB — COMPREHENSIVE METABOLIC PANEL
ALT: 59 U/L — ABNORMAL HIGH (ref 0–53)
AST: 24 U/L (ref 0–37)
Albumin: 3.7 g/dL (ref 3.5–5.2)
Alkaline Phosphatase: 102 U/L (ref 39–117)
Anion gap: 18 — ABNORMAL HIGH (ref 5–15)
BUN: 9 mg/dL (ref 6–23)
CO2: 21 mEq/L (ref 19–32)
Calcium: 9.5 mg/dL (ref 8.4–10.5)
Chloride: 98 mEq/L (ref 96–112)
Creatinine, Ser: 0.71 mg/dL (ref 0.50–1.35)
GFR calc Af Amer: >90 mL/min (ref >90)
GFR calc non Af Amer: >90 mL/min (ref >90)
Glucose, Bld: 155 mg/dL — ABNORMAL HIGH (ref 70–99)
Potassium: 4.4 mEq/L (ref 3.7–5.3)
Sodium: 137 mEq/L (ref 137–147)
Total Bilirubin: 0.2 mg/dL — ABNORMAL LOW (ref 0.3–1.2)
Total Protein: 7 g/dL (ref 6.0–8.3)

## 2014-02-02 LAB — ETHANOL: Alcohol, Ethyl (B): 127 mg/dL — ABNORMAL HIGH (ref 0–11)

## 2014-02-02 MED ORDER — ALUM & MAG HYDROXIDE-SIMETH 200-200-20 MG/5ML PO SUSP: 30.0000 mL | ORAL | Status: DC | PRN

## 2014-02-02 MED ORDER — LORAZEPAM 1 MG PO TABS: 0.0000 mg | ORAL_TABLET | Freq: Four times a day (QID) | ORAL | Status: DC

## 2014-02-02 MED ORDER — CYCLOBENZAPRINE HCL 5 MG PO TABS: 10.0000 mg | ORAL_TABLET | Freq: Three times a day (TID) | ORAL | Status: DC | PRN

## 2014-02-02 MED ORDER — ALBUTEROL SULFATE HFA 108 (90 BASE) MCG/ACT IN AERS: 2.0000 | INHALATION_SPRAY | RESPIRATORY_TRACT | Status: DC | PRN

## 2014-02-02 MED ORDER — MAGNESIUM HYDROXIDE 400 MG/5ML PO SUSP: 30.0000 mL | Freq: Every day | ORAL | Status: DC | PRN

## 2014-02-02 MED ORDER — METHOCARBAMOL 500 MG PO TABS: 500.0000 mg | ORAL_TABLET | Freq: Three times a day (TID) | ORAL | Status: DC | PRN

## 2014-02-02 MED ORDER — CLONIDINE HCL 0.1 MG PO TABS: 0.1000 mg | ORAL_TABLET | Freq: Every day | ORAL | Status: DC

## 2014-02-02 MED ORDER — NICOTINE 21 MG/24HR TD PT24: 21.0000 mg | MEDICATED_PATCH | Freq: Every day | TRANSDERMAL | Status: DC

## 2014-02-02 MED ORDER — LORAZEPAM 1 MG PO TABS: 1.0000 mg | ORAL_TABLET | Freq: Three times a day (TID) | ORAL | Status: DC | PRN

## 2014-02-02 MED ORDER — NAPROXEN 500 MG PO TABS: 500.0000 mg | ORAL_TABLET | Freq: Two times a day (BID) | ORAL | Status: DC | PRN

## 2014-02-02 MED ORDER — PANTOPRAZOLE SODIUM 40 MG PO TBEC
40.0000 mg | DELAYED_RELEASE_TABLET | Freq: Two times a day (BID) | ORAL | Status: DC
Start: 2014-02-02 — End: 2014-02-02
  Filled 2014-02-02 (×4): qty 1

## 2014-02-02 MED ORDER — ONDANSETRON HCL 4 MG PO TABS: 4.0000 mg | ORAL_TABLET | Freq: Three times a day (TID) | ORAL | Status: DC | PRN

## 2014-02-02 MED ORDER — LORAZEPAM 2 MG/ML IJ SOLN: 0.0000 mg | Freq: Two times a day (BID) | INTRAMUSCULAR | Status: DC

## 2014-02-02 MED ORDER — HYDROXYZINE HCL 50 MG PO TABS
50.0000 mg | ORAL_TABLET | Freq: Every day | ORAL | Status: DC
  Filled 2014-02-02 (×2): qty 1

## 2014-02-02 MED ORDER — IBUPROFEN 200 MG PO TABS: 600.0000 mg | ORAL_TABLET | Freq: Three times a day (TID) | ORAL | Status: DC | PRN

## 2014-02-02 MED ORDER — LORAZEPAM 1 MG PO TABS
0.0000 mg | ORAL_TABLET | Freq: Four times a day (QID) | ORAL | Status: DC
  Administered 2014-02-02: 1 mg via ORAL

## 2014-02-02 MED ORDER — LORAZEPAM 2 MG/ML IJ SOLN: 0.0000 mg | Freq: Four times a day (QID) | INTRAMUSCULAR | Status: DC

## 2014-02-02 MED ORDER — METOPROLOL TARTRATE 50 MG PO TABS
50.0000 mg | ORAL_TABLET | Freq: Two times a day (BID) | ORAL | Status: DC
  Filled 2014-02-02 (×4): qty 1

## 2014-02-02 MED ORDER — ACETAMINOPHEN 325 MG PO TABS: 650.0000 mg | ORAL_TABLET | ORAL | Status: DC | PRN

## 2014-02-02 MED ORDER — ACETAMINOPHEN 325 MG PO TABS: 650.0000 mg | ORAL_TABLET | Freq: Four times a day (QID) | ORAL | Status: DC | PRN

## 2014-02-02 MED ORDER — ONDANSETRON 4 MG PO TBDP: 4.0000 mg | ORAL_TABLET | Freq: Four times a day (QID) | ORAL | Status: DC | PRN

## 2014-02-02 MED ORDER — LOPERAMIDE HCL 2 MG PO CAPS: 2.0000 mg | ORAL_CAPSULE | ORAL | Status: DC | PRN

## 2014-02-02 MED ORDER — LOSARTAN POTASSIUM 50 MG PO TABS
100.0000 mg | ORAL_TABLET | Freq: Every day | ORAL | Status: DC
  Administered 2014-02-02: 100 mg via ORAL
  Filled 2014-02-02 (×4): qty 2

## 2014-02-02 MED ORDER — KETOROLAC TROMETHAMINE 60 MG/2ML IM SOLN
60.0000 mg | Freq: Once | INTRAMUSCULAR | Status: AC
  Administered 2014-02-02: 60 mg via INTRAMUSCULAR
  Filled 2014-02-02: qty 2

## 2014-02-02 MED ORDER — ZOLPIDEM TARTRATE 5 MG PO TABS: 5.0000 mg | ORAL_TABLET | Freq: Every evening | ORAL | Status: DC | PRN

## 2014-02-02 MED ORDER — PREGABALIN 50 MG PO CAPS: 100.0000 mg | ORAL_CAPSULE | Freq: Two times a day (BID) | ORAL | Status: DC

## 2014-02-02 MED ORDER — LORAZEPAM 1 MG PO TABS
0.0000 mg | ORAL_TABLET | Freq: Two times a day (BID) | ORAL | Status: DC
Start: 2014-02-02 — End: 2014-02-02
  Filled 2014-02-02: qty 1

## 2014-02-02 MED ORDER — DICYCLOMINE HCL 20 MG PO TABS: 20.0000 mg | ORAL_TABLET | Freq: Four times a day (QID) | ORAL | Status: DC | PRN

## 2014-02-02 MED ORDER — CLONIDINE HCL 0.1 MG PO TABS
0.1000 mg | ORAL_TABLET | ORAL | Status: DC
  Filled 2014-02-02: qty 1

## 2014-02-02 MED ORDER — HYDROXYZINE HCL 25 MG PO TABS: 25.0000 mg | ORAL_TABLET | Freq: Four times a day (QID) | ORAL | Status: DC | PRN

## 2014-02-02 MED ORDER — VITAMIN B-1 100 MG PO TABS
100.0000 mg | ORAL_TABLET | Freq: Every day | ORAL | Status: DC
Start: 2014-02-02 — End: 2014-02-02
  Administered 2014-02-02: 100 mg via ORAL
  Filled 2014-02-02: qty 1

## 2014-02-02 MED ORDER — GABAPENTIN 400 MG PO CAPS
400.0000 mg | ORAL_CAPSULE | Freq: Three times a day (TID) | ORAL | Status: DC
  Filled 2014-02-02 (×5): qty 1

## 2014-02-02 MED ORDER — LORAZEPAM 1 MG PO TABS: 0.0000 mg | ORAL_TABLET | Freq: Two times a day (BID) | ORAL | Status: DC

## 2014-02-02 MED ORDER — CLONIDINE HCL 0.1 MG PO TABS
0.1000 mg | ORAL_TABLET | Freq: Four times a day (QID) | ORAL | Status: DC
  Administered 2014-02-02: 0.1 mg via ORAL
  Filled 2014-02-02 (×8): qty 1

## 2014-02-02 MED ORDER — THIAMINE HCL 100 MG/ML IJ SOLN: 100.0000 mg | Freq: Every day | INTRAMUSCULAR | Status: DC

## 2014-02-02 NOTE — ED Provider Notes (Signed)
CSN: [id number]     Arrival date & time 02/02/14  1036 History   First MD Initiated Contact with Patient 02/02/14 1041     Chief Complaint  Patient presents with  . Medical Clearance  . 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 Pt states he is depressed due to chronic pain issues and is trying to get into a pain clinic but has not had any help from his PCP. Pt states he self medicates with alcohol when he cannot get any opiates. Reports he last drank 1 bottle of wine this morning and took Opana last night from a friend.  States he normally takes oxycodone for his back but his PCP will no longer refill his medication. Denies fever, n/v/d. Pt was admitted to [facility] last week but states "they didn't know what they were doing." pt has not stopped drinking since he got out. States he would like to get to [facility] to speak with Dr. Sabra Heck who has helped him in the past. Denies SI/HI.   Past Medical History  Diagnosis Date  . Hypertension   . Hyperlipidemia   . 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 ETOH abuse   . GERD (gastroesophageal reflux disease)   . Mental disorder   . Irregular heart beat   . Anxiety   . Shortness of breath   . Headache(784.0)   . Hepatitis   . Withdrawal seizures   . Alcohol withdrawal   . Neuropathy 06/07/2013  . 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 Seizure due to alcohol withdrawal 01/23/2014    Per patient report   Past Surgical History  Procedure Laterality Date  . Back surgery    . Neck surgery    . Fundoplasty transthoracic  2003  . Nasal sinus surgery     Family History  Problem Relation Age of Onset   . Breast cancer Paternal Grandmother   . Lung cancer      was a smoker  . Hypertension Mother    History  Substance Use Topics  . Smoking status: Current Every Day Smoker -- 0.50 packs/day for 30 years    Types: Cigarettes  . Smokeless tobacco: Never Used  . Alcohol Use: 4.2 oz/week    7 Shots of liquor per week     Comment: occasionally     Review of Systems  Constitutional: Negative for fever, chills and fatigue.  Gastrointestinal: Negative for nausea, vomiting, abdominal pain and diarrhea.  Musculoskeletal: Positive for myalgias and back pain ( chronic).  Skin: Negative for color change and wound.  Psychiatric/Behavioral: Positive for sleep disturbance. Negative for suicidal ideas. The patient is not nervous/anxious.   All other systems reviewed and are negative.     Allergies  Lisinopril  Home Medications   Prior to Admission medications   Medication Sig Start Date End Date Taking? Authorizing Provider  albuterol (PROVENTIL HFA;VENTOLIN HFA) 108 (90 BASE) MCG/ACT inhaler Inhale 2 puffs into the lungs every 4 (four) hours as needed for wheezing or shortness of breath. 01/28/14  Yes Encarnacion Slates, NP  gabapentin (NEURONTIN) 300 MG capsule Take 2 capsules (600 mg total) by mouth 3 (three) times daily. For alcohol withdrawal syndrome  01/28/14  Yes Encarnacion Slates, NP  hydrOXYzine (ATARAX/VISTARIL) 50 MG tablet Take 1 tablet (50 mg total) by mouth every 8 (eight) hours as needed for anxiety. 01/28/14  Yes Encarnacion Slates, NP  lidocaine (LIDODERM) 5 % Place 1 patch onto the skin daily. Remove & Discard patch within 12 hours or as directed by MD: For pain management 01/28/14  Yes Encarnacion Slates, NP  LORazepam (ATIVAN) 0.5 MG tablet Take 1 tablet (0.5 mg total) by mouth 3 (three) times daily. For anxiety Patient taking differently: Take 1-1.5 mg by mouth every 6 (six) hours as needed for anxiety. For anxiety 01/28/14  Yes Encarnacion Slates, NP  meloxicam (MOBIC) 15 MG tablet Take 1 tablet  (15 mg total) by mouth daily. For arthritic pain 01/28/14  Yes Encarnacion Slates, NP  metoprolol (LOPRESSOR) 50 MG tablet Take 1 tablet (50 mg total) by mouth 2 (two) times daily. For high blood pressure Patient taking differently: Take 100 mg by mouth daily. For high blood pressure 01/28/14  Yes Encarnacion Slates, NP  mirtazapine (REMERON) 15 MG tablet Take 1 tablet (15 mg total) by mouth at bedtime. For depression/insomnia 01/28/14  Yes Encarnacion Slates, NP  nicotine (NICODERM CQ - DOSED IN MG/24 HOURS) 14 mg/24hr patch Place 1 patch (14 mg total) onto the skin daily. For nicotine addiction 01/28/14  Yes Encarnacion Slates, NP  pantoprazole (PROTONIX) 40 MG tablet Take 1 tablet (40 mg total) by mouth 2 (two) times daily before a meal. For acid reflux 01/28/14  Yes Encarnacion Slates, NP  thiamine 100 MG tablet Take 1 tablet (100 mg total) by mouth daily. For low thiamine 01/28/14  Yes Encarnacion Slates, NP  traZODone (DESYREL) 50 MG tablet Take 1 tablet (50 mg total) by mouth at bedtime and may repeat dose one time if needed. For sleep 01/28/14  Yes Encarnacion Slates, NP  cloNIDine (CATAPRES) 0.1 MG tablet Take 1 tablet (0.1 mg total) by mouth daily before breakfast. x1 more dose then stop Patient not taking: Reported on 02/02/2014 01/28/14   Nicholaus Bloom, MD  losartan (COZAAR) 100 MG tablet Take 1 tablet (100 mg total) by mouth daily. For high blood pressure 01/28/14   Encarnacion Slates, NP   BP 141/94 mmHg  Pulse 100  Temp(Src) 97.5 F (36.4 C) (Oral)  Resp 18  SpO2 100% Physical Exam  Constitutional: He appears well-developed and well-nourished.  Pt fidgeting in exam chair, seems anxious and uncomfortable.  HENT:  Head: Normocephalic and atraumatic.  Eyes: Conjunctivae are normal. No scleral icterus.  Neck: Normal range of motion.  Cardiovascular: Normal rate, regular rhythm and normal heart sounds.   Pulmonary/Chest: Effort normal and breath sounds normal. No respiratory distress. He has no wheezes. He has no rales.  He exhibits no tenderness.  Abdominal: Soft. Bowel sounds are normal. He exhibits no distension and no mass. There is no tenderness. There is no rebound and no guarding.  Musculoskeletal: Normal range of motion.  Neurological: He is alert.  Skin: Skin is warm and dry.  Psychiatric: His speech is normal. Thought content normal. His mood appears anxious. He is agitated. He is not aggressive and not actively hallucinating. He expresses no homicidal and no suicidal ideation.  Nursing note and vitals reviewed.   ED Course  Procedures (including critical care time) Labs Review Labs Reviewed  CBC WITH DIFFERENTIAL - Abnormal; Notable for the following:    WBC 10.6 (*)    All  other components within normal limits  COMPREHENSIVE METABOLIC PANEL - Abnormal; Notable for the following:    Glucose, Bld 155 (*)    ALT 59 (*)    Total Bilirubin 0.2 (*)    Anion gap 18 (*)    All other components within normal limits  ETHANOL - Abnormal; Notable for the following:    Alcohol, Ethyl (B) 127 (*)    All other components within normal limits  URINE RAPID DRUG SCREEN (HOSP PERFORMED)    Imaging Review No results found.   EKG Interpretation None      MDM   Final diagnoses:  Alcohol dependence with alcohol-induced mood disorder  Opiate abuse, episodic    pt requesting detox from etoh and opiates which he started taking due to severe back pain.  Last drank 1 bottle of wine this morning and took Opana from a friend last night. Pt states he wants to speak with Dr. Sabra Heck across the street [facility]).  Pt seems anxious and agitated. He has threatened to leave if things take "too long"  Advised pt that he is in the ED volunitarily and may leave if he needs to, however, he would have to restart the detox process. Encouraged him to stay as long as he can. Will provide pt with meal tray. Pt requested percocet for his back pain. Advised he cannot have narcotic medications for pain if that is the reason he is  being seen in the ED.   Due to hx of alcoholic seizures and last drink this morning.   Pt medically cleared.  Patient accepted to the observation unit by Heloise Purpura, NP at [facility], PA-C 02/02/14 [place], MD 02/03/14 1017

## 2014-02-02 NOTE — BH Assessment (Addendum)
Assessment Note  Mark [name] is an 48 y.o. male that presents as a walk-in at [facility] requesting alcohol detox.  Pt stated he has been drinking since discharged from [facility] OBS 01/28/14.  Pt stated that the medications prescribed by [facility] "made me feel like a zombie" and that he has been drinking "to make this pain go away."  Pt stated he has been drinking one fifth liquor daily, last drank one bottle of wine this AM before driving self to [facility].  Pt stated he has also taken 2 Opana not prescribed to him for his chronic pain.  When asked about withdrawal sx, pt stated he was "about to get the heebie jeebies" and was irritable.  Pt also stated, "I see spooks out of the corner of my eye, but I have seen them all my life."  Pt denies SI or HI.  Pt endorses depressive sx.  Pt irritable, tearful, stating he wants his pain to go away.  Pt stated he tried to follow up with [facility], but couldn't see a psychiatrist and that he has tried to go to several medical doctors but was unable to get appointments with them.  Pt stated he was prescribed Oxycodone for pain before.  Pt alert, smells heavily of alcohol, had poor eye contact, was tearful with depressed mood, oriented x 4, had logical/coherent thoughts processes and slurred speech.  Consulted with Lyndel Safe, NP who accepted pt to [facility] O[facility] once pt sent to [facility] for medical clearance @ 1005.  Called [facility] to transport pt to [facility] and notified [place] charge nurse as well as TTS staff.    Axis I: 303.90 Alcohol Use Disorder, Severe, 296.32 Major Depressive Disorder, Recurrent Episode, Moderate Axis II: Deferred Axis III:  Past Medical History  Diagnosis Date  . Hypertension   . Hyperlipidemia   . 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 ETOH abuse   . GERD (gastroesophageal reflux disease)   . Mental disorder   . Irregular heart beat   . Anxiety   . Shortness of breath   . Headache(784.0)   . Hepatitis    . Withdrawal seizures   . Alcohol withdrawal   . Neuropathy 06/07/2013  . 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 Seizure due to alcohol withdrawal 01/23/2014    Per patient report   Axis IV: housing problems, other psychosocial or environmental problems and problems with primary support group Axis V: 31-40 impairment in reality testing  Past Medical History:  Past Medical History  Diagnosis Date  . Hypertension   . Hyperlipidemia   . 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 ETOH abuse   . GERD (gastroesophageal reflux disease)   . Mental disorder   . Irregular heart beat   . Anxiety   . Shortness of breath   . Headache(784.0)   . Hepatitis   . Withdrawal seizures   . Alcohol withdrawal   . Neuropathy 06/07/2013  . 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 Seizure due to alcohol withdrawal 01/23/2014    Per patient report    Past Surgical History  Procedure Laterality Date  . Back surgery    . Neck surgery    . Fundoplasty transthoracic  2003  . Nasal sinus surgery      Family History:  Family History  Problem Relation Age of Onset  . Breast cancer Paternal Grandmother   .  Lung cancer      was a smoker  . Hypertension Mother     Social History:  reports that he has been smoking Cigarettes.  He has a 15 pack-year smoking history. He has never used smokeless tobacco. He reports that he drinks about 4.2 oz of alcohol per week. He reports that he uses illicit drugs.  Additional Social History:  Alcohol / Drug Use Pain Medications: see med list Prescriptions: see med list Over the Counter: see med list History of alcohol / drug use?: Yes Longest period of sobriety (when/how long): unknown Withdrawal Symptoms: Irritability Substance #1 Name of Substance 1: Alcohol 1 - Age of First Use: unknown 1 - Amount (size/oz): one fifth liquor 1 - Frequency: daily 1 - Duration: ongoing 1 - Last Use / Amount: today - 1 bottle wine  CIWA:  CIWA-Ar Nausea and Vomiting: no nausea and no vomiting Tactile Disturbances: none Tremor: no tremor Auditory Disturbances: not present Paroxysmal Sweats: no sweat visible Visual Disturbances: very mild sensitivity Anxiety: mildly anxious Headache, Fullness in Head: none present Agitation: normal activity Orientation and Clouding of Sensorium: oriented and can do serial additions CIWA-Ar Total: 2 COWS:    Allergies:  Allergies  Allergen Reactions  . Lisinopril Anaphylaxis and Swelling    Per Dr. Terrence Dupont (12/20/13), patient can tolerate losartan.    Home Medications:  (Not in a hospital admission)  OB/GYN Status:  No LMP for male patient.  General Assessment Data Location of Assessment: [facility] Services Is this a Tele or Face-to-Face Assessment?: Face-to-Face Is this an Initial Assessment or a Re-assessment for this encounter?: Initial Assessment Living Arrangements: Parent Can pt return to current living arrangement?: Yes Admission Status: Voluntary Is patient capable of signing voluntary admission?: Yes Transfer from: Home Referral Source: Self/Family/Friend  Medical Screening Exam ([place]) Medical Exam completed: No Reason for MSE not completed: Other: (pt sent to [facility] for med clearance)  [place] Living Arrangements: Parent Name of Psychiatrist: none Name of Therapist: none  Education Status Is patient currently in school?: No  Risk to self with the past 6 months Suicidal Ideation: No Suicidal Intent: No Is patient at risk for suicide?: No Suicidal Plan?: No Access to Means: No What has been your use of drugs/alcohol within the last 12 months?: pt reports alcohol use and Opana use Previous Attempts/Gestures: No How many times?: 0 Other Self Harm Risks: na - pt denies Triggers for Past Attempts: None known Intentional Self Injurious Behavior: None Family Suicide History: Unknown Recent stressful life event(s): Other (Comment)  (chronic pain, SA) Persecutory voices/beliefs?: No Depression: Yes Depression Symptoms: Despondent, Fatigue, Loss of interest in usual pleasures, Feeling worthless/self pity, Feeling angry/irritable Substance abuse history and/or treatment for substance abuse?: Yes Suicide prevention information given to non-admitted patients: Not applicable  Risk to Others within the past 6 months Homicidal Ideation: No Thoughts of Harm to Others: No Current Homicidal Intent: No Current Homicidal Plan: No Access to Homicidal Means: No Identified Victim: na - pt denies History of harm to others?: No Assessment of Violence: None Noted Violent Behavior Description: na - pt irritable, but cooperative Does patient have access to weapons?: No Criminal Charges Pending?: No Does patient have a court date: No  Psychosis Hallucinations: Visual (reports sees "spooks" out of corner of his eye) Delusions: None noted  Mental Status Report Appear/Hygiene: Disheveled, Other (Comment) (smells of ETOH) Eye Contact: Fair Motor Activity: Freedom of movement, Unremarkable Speech: Logical/coherent Level of Consciousness: Alert, Irritable Mood:  Depressed, Irritable Affect: Irritable, Depressed Anxiety Level: Minimal Thought Processes: Coherent, Relevant Judgement: Impaired Orientation: Person, Place, Situation Obsessive Compulsive Thoughts/Behaviors: None  Cognitive Functioning Concentration: Normal Memory: Recent Intact, Remote Intact IQ: Average Insight: Poor Impulse Control: Poor Appetite: Poor Weight Loss: 0 Weight Gain: 0 Sleep: Decreased Total Hours of Sleep:  (unknown) Vegetative Symptoms: None  ADLScreening [facility] Assessment Services) Patient's cognitive ability adequate to safely complete daily activities?: Yes Patient able to express need for assistance with ADLs?: Yes Independently performs ADLs?: Yes (appropriate for developmental age)  Prior Inpatient Therapy Prior Inpatient Therapy:  Yes Prior Therapy Dates: 01/28/14 Prior Therapy Facilty/Provider(s): [facility] OBS Reason for Treatment: Alcohol Detos  Prior Outpatient Therapy Prior Outpatient Therapy: No Prior Therapy Dates: na Prior Therapy Facilty/Provider(s): na Reason for Treatment: na  ADL Screening (condition at time of admission) Patient's cognitive ability adequate to safely complete daily activities?: Yes Is the patient deaf or have difficulty hearing?: No Does the patient have difficulty seeing, even when wearing glasses/contacts?: No Does the patient have difficulty concentrating, remembering, or making decisions?: No Patient able to express need for assistance with ADLs?: Yes Does the patient have difficulty dressing or bathing?: No Independently performs ADLs?: Yes (appropriate for developmental age) Does the patient have difficulty walking or climbing stairs?: No  Home Assistive Devices/Equipment Home Assistive Devices/Equipment: None    Abuse/Neglect Assessment (Assessment to be complete while patient is alone) Physical Abuse: Denies Verbal Abuse: Denies Sexual Abuse: Denies Exploitation of patient/patient's resources: Denies Self-Neglect: Denies Values / Beliefs Cultural Requests During Hospitalization: None Spiritual Requests During Hospitalization: None Consults Spiritual Care Consult Needed: No Social Work Consult Needed: No Regulatory affairs officer (For Healthcare) Does patient have an advance directive?: Yes Would patient like information on creating an advanced directive?: No - patient declined information Type of Advance Directive: Living will Copy of advanced directive(s) in chart?: No - copy requested    Additional Information 1:1 In Past 12 Months?: No CIRT Risk: No Elopement Risk: No Does patient have medical clearance?: No     Disposition:  Disposition Initial Assessment Completed for this Encounter: Yes Disposition of Patient: Other dispositions Other disposition(s): Other  (Comment) (Pt accepted [place] [facility] by Lyndel Safe, NP; sent to [facility] )  On Site Evaluation by:   Reviewed with Physician:    Shaune Pascal, [place], [facility] Licensed Professional Counselor Therapeutic Triage Specialist [facility] Phone: [phone] Fax: [phone]  02/02/2014 10:29 AM

## 2014-02-02 NOTE — BH Assessment (Signed)
Patient accepted to the observation unit by Heloise Purpura, NP. Patient assigned to bed #2.  Nursing report # [phone]. Support paperwork completed.

## 2014-02-02 NOTE — ED Notes (Addendum)
Pt sent here by [facility] for med clearance.  States he is depressed, has chronic pain issues and is trying to get into a pain clinic.  He self medicates with alcohol when he can't get opiates.  Denies SI/HI.

## 2014-02-02 NOTE — H&P (Signed)
[place] OBS UNIT H&P  Patient Identification:  Mark [name] Date of Evaluation:  02/02/2014 Chief Complaint:  ALCOHOL ABUSE    Subjective: Pt seen and chart reviewed. Pt reports that he "wants to see Dr. Sabra [name]" and wants to "get Suboxone because he'll give it to me". Pt is requesting that we give him a 6 week refill of pain medications and/or get him inpatient so that he can see Dr. Sabra [name]. Pt is not requesting detox from alcohol or opiates but wants "pain management" and states "I have to find a way to manage my pain and I have to drink alcohol to be seen to deal with this". Pt reports that he was sent to [facility] but could not be seen for 2 weeks (coming up on 12/9). Pt was discharged from [facility] a few days ago and was inpatient. This NP informed pt that we do not do pain management in the OBS Unit or at [facility] and that he must go to a primary care provider or pain management clinic. Pt was agitated and stated that he was "ready to get my clothes and go if you can't give me a 6 week prescription to hold me over because I'm apparently in the wrong place. It's a waste of my time and your time if you guys can't manage my pain. That's the only reason I'm here." Pt reports that he does not have a problem with alcohol and that he only does that for pain management and hinted that this was the only way he could be brought inpatient through alcoholism.   Based on inpatient notes, pt repeatedly requested opiate pain medication from many staff members and providers and reported that [facility] staff does "not know what they are doing" in regard to medication management. Pt denied any efficacy of any medications other than narcotics and consistently rated pain at 8/10 regardless of the medication he received. Pt was discharged home with resources for followup and did receive a clonidine detox protocol while he was inpatient.   Pt was assisted with appointments for 12/01 and 12/09 for pain management as well as primary  care and pt will report to Urgent Care as needed at this time before these appointments.   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 Pt states he is depressed due to chronic pain issues and is trying to get into a pain clinic but has not had any help from his PCP. Pt states he self medicates with alcohol when he cannot get any opiates. Reports he last drank 1 bottle of wine this morning and took Opana last night from a friend. States he normally takes oxycodone for his back but his PCP will no longer refill his medication. Denies fever, n/v/d. Pt was admitted to [facility] last week but states "they didn't know what they were doing." pt has not stopped drinking since he got out. States he would like to get to [facility] to speak with Dr. Sabra [name] who has helped him in the past. Denies SI/HI.   Total Time spent with patient: 45 minutes  Psychiatric Specialty Exam: Physical Exam  Constitutional: He appears well-developed and well-nourished.  Psychiatric: His speech is normal and behavior is normal. Judgment and thought content normal. His mood appears anxious. Cognition  and memory are normal. He exhibits a depressed mood.  Patient is seen and the chart is reviewed. I agree with the findings of the exam completed in the ED.    Review of Systems  Constitutional: Negative.   HENT: Negative.   Eyes: Negative.   Respiratory: Negative.   Cardiovascular: Negative.   Gastrointestinal: Negative.   Genitourinary: Negative.   Musculoskeletal: Negative.   Skin: Negative.   Neurological: Negative.   Endo/Heme/Allergies: Negative.   Psychiatric/Behavioral: Positive for depression and substance abuse. Negative for suicidal ideas and hallucinations. The patient is nervous/anxious.      Blood pressure 135/117, pulse 122, temperature 97.9 F (36.6 C), temperature source Oral, resp. rate 16.There is no weight on file to calculate BMI.  General Appearance: Casual  Eye Contact::  Good  Speech:  Clear and Coherent  Volume:  Normal  Mood:  Anxious  Affect:  Congruent  Thought Process:  Goal Directed  Orientation:  Full (Time, Place, and Person)  Thought Content:  WDL  Suicidal Thoughts:  No  Homicidal Thoughts:  No  Memory:  Negative  Judgement:  Intact  Insight:  Present  Psychomotor Activity:  Normal  Concentration:  Fair  Recall:  NA  Fund of Knowledge:Good  Language: Good  Akathisia:  No  Handed:  Right  AIMS (if indicated):     Assets:  Communication Skills Desire for Improvement  Sleep:       Musculoskeletal: Strength & Muscle Tone: within normal limits Gait & Station: normal Patient leans: N/A  Past Psychiatric History: Diagnosis:  denies  Hospitalizations:  [facility] 2014, [facility] 2011, [facility] Nov 2015  Outpatient Care:  Dr. Georga Bora  Substance Abuse Care:  none  Self-Mutilation:  denies  Suicidal Attempts:   denies  Violent Behaviors:     denies   Past Medical History:   Past Medical History  Diagnosis Date  . Hypertension   . Hyperlipidemia   . 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 ETOH abuse   . GERD (gastroesophageal reflux disease)   . Mental disorder   . Irregular heart beat   . Anxiety   . Shortness of breath   . Headache(784.0)   . Hepatitis   . Withdrawal seizures   . Alcohol withdrawal   . Neuropathy 06/07/2013   Seizure History:  with alcohol withdrawal Allergies:   Allergies  Allergen Reactions  . Lisinopril Anaphylaxis and Swelling    Per Dr. Terrence Dupont (12/20/13), patient can tolerate losartan.   PTA Medications: Medications Prior to Admission  Medication Sig Dispense Refill  . albuterol (PROVENTIL HFA;VENTOLIN HFA) 108 (90 BASE) MCG/ACT inhaler Inhale 2 puffs into the lungs every 4  (four) hours as needed for wheezing or shortness of breath.    . gabapentin (NEURONTIN) 300 MG capsule Take 2 capsules (600 mg total) by mouth 3 (three) times daily. For alcohol withdrawal syndrome 180 capsule 0  . hydrOXYzine (ATARAX/VISTARIL) 50 MG tablet Take 1 tablet (50 mg total) by mouth every 8 (eight) hours as needed for anxiety. 60 tablet 0  . lidocaine (LIDODERM) 5 % Place 1 patch onto the skin daily. Remove & Discard patch within 12 hours or as directed by MD: For pain management 5 patch 0  . LORazepam (ATIVAN) 0.5 MG tablet Take 1 tablet (0.5 mg total) by mouth 3 (three) times daily. For anxiety (Patient taking differently: Take 1-1.5 mg by mouth every 6 (six) hours as needed for anxiety. For  anxiety) 12 tablet 0  . losartan (COZAAR) 100 MG tablet Take 1 tablet (100 mg total) by mouth daily. For high blood pressure 30 tablet 3  . meloxicam (MOBIC) 15 MG tablet Take 1 tablet (15 mg total) by mouth daily. For arthritic pain 15 tablet 0  . metoprolol (LOPRESSOR) 50 MG tablet Take 1 tablet (50 mg total) by mouth 2 (two) times daily. For high blood pressure (Patient taking differently: Take 100 mg by mouth daily. For high blood pressure)    . mirtazapine (REMERON) 15 MG tablet Take 1 tablet (15 mg total) by mouth at bedtime. For depression/insomnia 30 tablet 0  . nicotine (NICODERM CQ - DOSED IN MG/24 HOURS) 14 mg/24hr patch Place 1 patch (14 mg total) onto the skin daily. For nicotine addiction 28 patch 0  . pantoprazole (PROTONIX) 40 MG tablet Take 1 tablet (40 mg total) by mouth 2 (two) times daily before a meal. For acid reflux  5  . thiamine 100 MG tablet Take 1 tablet (100 mg total) by mouth daily. For low thiamine 30 tablet 0  . traZODone (DESYREL) 50 MG tablet Take 1 tablet (50 mg total) by mouth at bedtime and may repeat dose one time if needed. For sleep 60 tablet 0  . cloNIDine (CATAPRES) 0.1 MG tablet Take 1 tablet (0.1 mg total) by mouth daily before breakfast. x1 more dose then stop  (Patient not taking: Reported on 02/02/2014) 1 tablet 11                                                                                                 Previous Psychotropic Medications:  Medication/Dose  SEE MAR               Substance Abuse History in the last 12 months:  Yes.    Consequences of Substance Abuse: Medical Consequences:  worsening of physical health DT's: Withdrawal Symptoms:   Cramps Diaphoresis Diarrhea Headaches Nausea Tremors Vomiting  Social History:   reports that he has been smoking Cigarettes.  He has a 15 pack-year smoking history. He has never used smokeless tobacco. He reports that he drinks about 4.2 oz of alcohol per week. He reports that he uses illicit drugs.  Family History:   Family History  Problem Relation Age of Onset  . Breast cancer Paternal Grandmother   . Lung cancer      was a smoker  . Hypertension Mother      Psychological Evaluations:   DSM5:  Substance/Addictive Disorders:  Substance induced mood disorder, Substance abuse Depressive Disorders:  MDD AXIS I:  Alcohol dependence Opiate dependence AXIS II:  Deferred AXIS III:   Past Medical History  Diagnosis Date  . Hypertension   . Hyperlipidemia   . 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 ETOH abuse   . GERD (gastroesophageal reflux disease)   . Mental disorder   . Irregular heart beat   . Anxiety   . Shortness of breath   . Headache(784.0)   . Hepatitis   . Withdrawal seizures   .  Alcohol withdrawal   . Neuropathy 06/07/2013  . 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 Seizure due to alcohol withdrawal 01/23/2014    Per patient report   AXIS IV:  problems related to social environment AXIS V:  51-60 moderate symptoms  Treatment Plan/Recommendations:   -Discharge home with outpatient appointments as listed in TTS notes.    Treatment Plan Summary: Daily contact with patient to assess and evaluate  symptoms and progress in treatment Medication management Current Medications:  Current Facility-Administered Medications  Medication Dose Route Frequency Provider Last Rate Last Dose  . acetaminophen (TYLENOL) tablet 650 mg  650 mg Oral Q6H PRN Kennedy Bucker, NP      . albuterol (PROVENTIL HFA;VENTOLIN HFA) 108 (90 BASE) MCG/ACT inhaler 2 puff  2 puff Inhalation Q4H PRN Kennedy Bucker, NP      . alum & mag hydroxide-simeth (MAALOX/MYLANTA) 200-200-20 MG/5ML suspension 30 mL  30 mL Oral Q4H PRN Kennedy Bucker, NP      . cloNIDine (CATAPRES) tablet 0.1 mg  0.1 mg Oral QID Kennedy Bucker, NP       Followed by  . [START ON 02/04/2014] cloNIDine (CATAPRES) tablet 0.1 mg  0.1 mg Oral BH-qamhs Kennedy Bucker, NP       Followed by  . [START ON 02/06/2014] cloNIDine (CATAPRES) tablet 0.1 mg  0.1 mg Oral QAC breakfast Kennedy Bucker, NP      . cyclobenzaprine (FLEXERIL) tablet 10 mg  10 mg Oral TID PRN Kennedy Bucker, NP      . dicyclomine (BENTYL) tablet 20 mg  20 mg Oral Q6H PRN Kennedy Bucker, NP      . gabapentin (NEURONTIN) capsule 400 mg  400 mg Oral TID Kennedy Bucker, NP      . hydrOXYzine (ATARAX/VISTARIL) tablet 25 mg  25 mg Oral Q6H PRN Kennedy Bucker, NP      . hydrOXYzine (ATARAX/VISTARIL) tablet 50 mg  50 mg Oral QHS Kennedy Bucker, NP      . loperamide (IMODIUM) capsule 2-4 mg  2-4 mg Oral PRN Kennedy Bucker, NP      . LORazepam (ATIVAN) tablet 0-4 mg  0-4 mg Oral 4 times per day Kennedy Bucker, NP       Followed by  . [START ON 02/04/2014] LORazepam (ATIVAN) tablet 0-4 mg  0-4 mg Oral Q12H Kennedy Bucker, NP      . losartan (COZAAR) tablet 100 mg  100 mg Oral Daily Kennedy Bucker, NP      . magnesium hydroxide (MILK OF MAGNESIA) suspension 30 mL  30 mL Oral Daily PRN Kennedy Bucker, NP      . methocarbamol (ROBAXIN) tablet 500 mg  500 mg Oral Q8H PRN Kennedy Bucker, NP      . metoprolol (LOPRESSOR) tablet 50 mg  50 mg Oral BID Kennedy Bucker, NP      . naproxen (NAPROSYN) tablet 500  mg  500 mg Oral BID PRN Kennedy Bucker, NP      . ondansetron (ZOFRAN-ODT) disintegrating tablet 4 mg  4 mg Oral Q6H PRN Kennedy Bucker, NP      . pantoprazole (PROTONIX) EC tablet 40 mg  40 mg Oral BID AC Shelly Eisbach, NP      . pregabalin (LYRICA) capsule 100 mg  100 mg Oral BID Kennedy Bucker, NP          Benjamine Mola, FNP-BC  2:26 PM 02/02/2014

## 2014-02-02 NOTE — Progress Notes (Signed)
Patient ID: Mark [name], male   DOB: 1965-08-12, 48 y.o.   MRN: [id number] Admission Note-Sent over from the [facility] to OBS to complete detox. He states he has a lot of chronic pain in his legs and neck and without pain meds he is drinking alcohol to help cope. He states alcohol isnt his real problem and his one complaint of withdrawal is feeling like things are crawling on him. He states 0.5 mg doesn't touch him. He is irritable but compliant with admission. He denies suicidal or homicidal ideation. He is not psychotic. Property locked up and put on scrubs after skin assessment. Given food and drink once on unit. Notified Conrad NP and he came into see client shortly after arriving. He is a chronic pain client and just wants pain medications for a month or so until he can see a Dr at the [facility].

## 2014-02-02 NOTE — Progress Notes (Signed)
Patient ID: CAGE GUPTON, male   DOB: 1965/05/12, 48 y.o.   MRN: [id number] Discharge Note-Seen by Heloise Purpura NP and it was determined in consult with Dr. Dwyane Dee that client could be discharged today. SW helped him find a medical Dr. For pain management He has stated since admission that he only needed pain management, not detox or rehab. He has an appt with [facility] on the 9th at 3 pm. He states he would be feel better at home than here. He is denying suicidal or homicidal ideation. He is not having significant sx of withdrawal and can be safe at home. He is not leaving with any Rx's. He drove self here and truck out front in [facility] parking lot. He is anxious to leave and not wanting to wait for AVS and refused to sign. All property returned to him and escorted to lobby.

## 2014-02-02 NOTE — ED Notes (Signed)
Report called to Sue, RN

## 2014-02-02 NOTE — Discharge Summary (Signed)
[facility] OBS UNIT DISCHARGE SUMMARY  Patient Identification:  Mark [name] Date of Evaluation:  02/02/2014 Chief Complaint:  ALCOHOL ABUSE    Subjective: Pt seen and chart reviewed. Pt reports that he "wants to see Dr. Sabra Heck" and wants to "get Suboxone because he'll give it to me". Pt is requesting that we give him a 6 week refill of pain medications and/or get him inpatient so that he can see Dr. Sabra Heck. Pt is not requesting detox from alcohol or opiates but wants "pain management" and states "I have to find a way to manage my pain and I have to drink alcohol to be seen to deal with this". Pt reports that he was sent to [facility] but could not be seen for 2 weeks (coming up on 12/9). Pt was discharged from [facility] a few days ago and was inpatient. This NP informed pt that we do not do pain management in the OBS Unit or at [facility] and that he must go to a primary care provider or pain management clinic. Pt was agitated and stated that he was "ready to get my clothes and go if you can't give me a 6 week prescription to hold me over because I'm apparently in the wrong place. It's a waste of my time and your time if you guys can't manage my pain. That's the only reason I'm here." Pt reports that he does not have a problem with alcohol and that he only does that for pain management and hinted that this was the only way he could be brought inpatient through alcoholism.   Based on inpatient notes, pt repeatedly requested opiate pain medication from many staff members and providers and reported that [facility] staff does "not know what they are doing" in regard to medication management. Pt denied any efficacy of any medications other than narcotics and consistently rated pain at 8/10 regardless of the medication he received. Pt was discharged home with resources for followup and did receive a clonidine detox protocol while he was inpatient.   Pt was assisted with appointments for 12/01 and 12/09 for pain management as  well as primary care and pt will report to Urgent Care as needed at this time before these appointments.   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 Pt states he is depressed due to chronic pain issues and is trying to get into a pain clinic but has not had any help from his PCP. Pt states he self medicates with alcohol when he cannot get any opiates. Reports he last drank 1 bottle of wine this morning and took Opana last night from a friend. States he normally takes oxycodone for his back but his PCP will no longer refill his medication. Denies fever, n/v/d. Pt was admitted to [facility] last week but states "they didn't know what they were doing." pt has not stopped drinking since he got out. States he would like to get to [facility] to speak with Dr. Sabra Heck who has helped him in the past. Denies SI/HI.   Total Time spent with patient: 45 minutes  Psychiatric Specialty Exam: Physical Exam  Constitutional: He appears well-developed and well-nourished.  Psychiatric: His speech is normal and behavior is normal. Judgment and thought content normal. His mood appears anxious.  Cognition and memory are normal. He exhibits a depressed mood.  Patient is seen and the chart is reviewed. I agree with the findings of the exam completed in the ED.    Review of Systems  Constitutional: Negative.   HENT: Negative.   Eyes: Negative.   Respiratory: Negative.   Cardiovascular: Negative.   Gastrointestinal: Negative.   Genitourinary: Negative.   Musculoskeletal: Negative.   Skin: Negative.   Neurological: Negative.   Endo/Heme/Allergies: Negative.   Psychiatric/Behavioral: Positive for depression and substance abuse. Negative for suicidal ideas and hallucinations. The patient is  nervous/anxious.     Blood pressure 135/117, pulse 122, temperature 97.9 F (36.6 C), temperature source Oral, resp. rate 16.There is no weight on file to calculate BMI.  General Appearance: Casual  Eye Contact::  Good  Speech:  Clear and Coherent  Volume:  Normal  Mood:  Anxious  Affect:  Congruent  Thought Process:  Goal Directed  Orientation:  Full (Time, Place, and Person)  Thought Content:  WDL  Suicidal Thoughts:  No  Homicidal Thoughts:  No  Memory:  Negative  Judgement:  Intact  Insight:  Present  Psychomotor Activity:  Normal  Concentration:  Fair  Recall:  NA  Fund of Knowledge:Good  Language: Good  Akathisia:  No  Handed:  Right  AIMS (if indicated):     Assets:  Communication Skills Desire for Improvement  Sleep:       Musculoskeletal: Strength & Muscle Tone: within normal limits Gait & Station: normal Patient leans: N/A  Past Psychiatric History: Diagnosis:  denies  Hospitalizations:  [facility] 2014, [facility] 2011, [facility] Nov 2015  Outpatient Care:  Dr. Georga Bora  Substance Abuse Care:  none  Self-Mutilation:  denies  Suicidal Attempts:   denies  Violent Behaviors:     denies   Past Medical History:   Past Medical History  Diagnosis Date  . Hypertension   . Hyperlipidemia   . 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 ETOH abuse   . GERD (gastroesophageal reflux disease)   . Mental disorder   . Irregular heart beat   . Anxiety   . Shortness of breath   . Headache(784.0)   . Hepatitis   . Withdrawal seizures   . Alcohol withdrawal   . Neuropathy 06/07/2013   Seizure History:  with alcohol withdrawal Allergies:   Allergies  Allergen Reactions  . Lisinopril Anaphylaxis and Swelling    Per Dr. Terrence Dupont (12/20/13), patient can tolerate losartan.   PTA Medications: Medications Prior to Admission  Medication Sig Dispense Refill  . albuterol (PROVENTIL HFA;VENTOLIN HFA) 108 (90 BASE) MCG/ACT inhaler Inhale 2 puffs into  the lungs every 4 (four) hours as needed for wheezing or shortness of breath.    . gabapentin (NEURONTIN) 300 MG capsule Take 2 capsules (600 mg total) by mouth 3 (three) times daily. For alcohol withdrawal syndrome 180 capsule 0  . hydrOXYzine (ATARAX/VISTARIL) 50 MG tablet Take 1 tablet (50 mg total) by mouth every 8 (eight) hours as needed for anxiety. 60 tablet 0  . lidocaine (LIDODERM) 5 % Place 1 patch onto the skin daily. Remove & Discard patch within 12 hours or as directed by MD: For pain management 5 patch 0  . LORazepam (ATIVAN) 0.5 MG tablet Take 1 tablet (0.5 mg total) by mouth 3 (three) times daily. For anxiety (Patient taking differently: Take 1-1.5 mg by mouth every 6 (six) hours as needed for anxiety.  For anxiety) 12 tablet 0  . losartan (COZAAR) 100 MG tablet Take 1 tablet (100 mg total) by mouth daily. For high blood pressure 30 tablet 3  . meloxicam (MOBIC) 15 MG tablet Take 1 tablet (15 mg total) by mouth daily. For arthritic pain 15 tablet 0  . metoprolol (LOPRESSOR) 50 MG tablet Take 1 tablet (50 mg total) by mouth 2 (two) times daily. For high blood pressure (Patient taking differently: Take 100 mg by mouth daily. For high blood pressure)    . mirtazapine (REMERON) 15 MG tablet Take 1 tablet (15 mg total) by mouth at bedtime. For depression/insomnia 30 tablet 0  . nicotine (NICODERM CQ - DOSED IN MG/24 HOURS) 14 mg/24hr patch Place 1 patch (14 mg total) onto the skin daily. For nicotine addiction 28 patch 0  . pantoprazole (PROTONIX) 40 MG tablet Take 1 tablet (40 mg total) by mouth 2 (two) times daily before a meal. For acid reflux  5  . thiamine 100 MG tablet Take 1 tablet (100 mg total) by mouth daily. For low thiamine 30 tablet 0  . traZODone (DESYREL) 50 MG tablet Take 1 tablet (50 mg total) by mouth at bedtime and may repeat dose one time if needed. For sleep 60 tablet 0  . cloNIDine (CATAPRES) 0.1 MG tablet Take 1 tablet (0.1 mg total) by mouth daily before breakfast. x1  more dose then stop (Patient not taking: Reported on 02/02/2014) 1 tablet 11                                                                                                 Previous Psychotropic Medications:  Medication/Dose  SEE MAR               Substance Abuse History in the last 12 months:  Yes.    Consequences of Substance Abuse: Medical Consequences:  worsening of physical health DT's: Withdrawal Symptoms:   Cramps Diaphoresis Diarrhea Headaches Nausea Tremors Vomiting  Social History:   reports that he has been smoking Cigarettes.  He has a 15 pack-year smoking history. He has never used smokeless tobacco. He reports that he drinks about 4.2 oz of alcohol per week. He reports that he uses illicit drugs.  Family History:   Family History  Problem Relation Age of Onset  . Breast cancer Paternal Grandmother   . Lung cancer      was a smoker  . Hypertension Mother      Psychological Evaluations:   DSM5:  Substance/Addictive Disorders:  Substance induced mood disorder, Substance abuse Depressive Disorders:  MDD AXIS I:  Alcohol dependence Opiate dependence AXIS II:  Deferred AXIS III:   Past Medical History  Diagnosis Date  . Hypertension   . Hyperlipidemia   . 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 ETOH abuse   . GERD (gastroesophageal reflux disease)   . Mental disorder   . Irregular heart beat   . Anxiety   . Shortness of breath   . Headache(784.0)   . Hepatitis   . Withdrawal seizures   .  Alcohol withdrawal   . Neuropathy 06/07/2013  . 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 Seizure due to alcohol withdrawal 01/23/2014    Per patient report   AXIS IV:  problems related to social environment AXIS V:  51-60 moderate symptoms  Treatment Plan/Recommendations:   -Discharge home with outpatient appointments as listed in TTS notes.    Treatment Plan Summary: Daily contact with patient to assess  and evaluate symptoms and progress in treatment Medication management Current Medications:  Current Facility-Administered Medications  Medication Dose Route Frequency Provider Last Rate Last Dose  . acetaminophen (TYLENOL) tablet 650 mg  650 mg Oral Q6H PRN Kennedy Bucker, NP      . albuterol (PROVENTIL HFA;VENTOLIN HFA) 108 (90 BASE) MCG/ACT inhaler 2 puff  2 puff Inhalation Q4H PRN Kennedy Bucker, NP      . alum & mag hydroxide-simeth (MAALOX/MYLANTA) 200-200-20 MG/5ML suspension 30 mL  30 mL Oral Q4H PRN Kennedy Bucker, NP      . cloNIDine (CATAPRES) tablet 0.1 mg  0.1 mg Oral QID Kennedy Bucker, NP   0.1 mg at 02/02/14 1432   Followed by  . [START ON 02/04/2014] cloNIDine (CATAPRES) tablet 0.1 mg  0.1 mg Oral BH-qamhs Kennedy Bucker, NP       Followed by  . [START ON 02/06/2014] cloNIDine (CATAPRES) tablet 0.1 mg  0.1 mg Oral QAC breakfast Kennedy Bucker, NP      . cyclobenzaprine (FLEXERIL) tablet 10 mg  10 mg Oral TID PRN Kennedy Bucker, NP      . dicyclomine (BENTYL) tablet 20 mg  20 mg Oral Q6H PRN Kennedy Bucker, NP      . gabapentin (NEURONTIN) capsule 400 mg  400 mg Oral TID Kennedy Bucker, NP      . hydrOXYzine (ATARAX/VISTARIL) tablet 25 mg  25 mg Oral Q6H PRN Kennedy Bucker, NP      . hydrOXYzine (ATARAX/VISTARIL) tablet 50 mg  50 mg Oral QHS Kennedy Bucker, NP      . loperamide (IMODIUM) capsule 2-4 mg  2-4 mg Oral PRN Kennedy Bucker, NP      . LORazepam (ATIVAN) tablet 0-4 mg  0-4 mg Oral 4 times per day Kennedy Bucker, NP       Followed by  . [START ON 02/04/2014] LORazepam (ATIVAN) tablet 0-4 mg  0-4 mg Oral Q12H Kennedy Bucker, NP      . losartan (COZAAR) tablet 100 mg  100 mg Oral Daily Kennedy Bucker, NP   100 mg at 02/02/14 1431  . magnesium hydroxide (MILK OF MAGNESIA) suspension 30 mL  30 mL Oral Daily PRN Kennedy Bucker, NP      . metoprolol (LOPRESSOR) tablet 50 mg  50 mg Oral BID Kennedy Bucker, NP      . naproxen (NAPROSYN) tablet 500 mg  500 mg Oral BID PRN  Kennedy Bucker, NP      . ondansetron (ZOFRAN-ODT) disintegrating tablet 4 mg  4 mg Oral Q6H PRN Kennedy Bucker, NP      . pantoprazole (PROTONIX) EC tablet 40 mg  40 mg Oral BID AC Kennedy Bucker, NP      . pregabalin (LYRICA) capsule 100 mg  100 mg Oral BID Kennedy Bucker, NP          Benjamine Mola, FNP-BC  3:23 PM 02/02/2014

## 2014-02-03 ENCOUNTER — Encounter (HOSPITAL_COMMUNITY): Payer: Self-pay

## 2014-02-03 ENCOUNTER — Encounter (HOSPITAL_COMMUNITY): Payer: Self-pay | Admitting: Emergency Medicine

## 2014-02-03 ENCOUNTER — Emergency Department (HOSPITAL_COMMUNITY)
Admission: EM | Admit: 2014-02-03 | Discharge: 2014-02-03 | Disposition: A | Discharge status: Other Acute Inpatient Hospital | Payer: MEDICAID | Attending: Emergency Medicine | Admitting: Emergency Medicine

## 2014-02-03 ENCOUNTER — Inpatient Hospital Stay (HOSPITAL_COMMUNITY)
Admission: AD | Admit: 2014-02-03 | Discharge: 2014-02-07 | DRG: 897 | Disposition: A | Discharge status: Home / Self Care | Payer: Federal, State, Local not specified - Other | Source: Intra-hospital | Attending: Psychiatry | Admitting: Psychiatry

## 2014-02-03 DIAGNOSIS — Z79899 Other long term (current) drug therapy: Secondary | ICD-10-CM | POA: Diagnosis not present

## 2014-02-03 DIAGNOSIS — Z791 Long term (current) use of non-steroidal anti-inflammatories (NSAID): Secondary | ICD-10-CM | POA: Insufficient documentation

## 2014-02-03 DIAGNOSIS — F332 Major depressive disorder, recurrent severe without psychotic features: Secondary | ICD-10-CM | POA: Diagnosis present

## 2014-02-03 DIAGNOSIS — F419 Anxiety disorder, unspecified: Secondary | ICD-10-CM | POA: Insufficient documentation

## 2014-02-03 DIAGNOSIS — E785 Hyperlipidemia, unspecified: Secondary | ICD-10-CM | POA: Diagnosis present

## 2014-02-03 DIAGNOSIS — G8929 Other chronic pain: Secondary | ICD-10-CM | POA: Diagnosis present

## 2014-02-03 DIAGNOSIS — F1024 Alcohol dependence with alcohol-induced mood disorder: Secondary | ICD-10-CM | POA: Diagnosis present

## 2014-02-03 DIAGNOSIS — I10 Essential (primary) hypertension: Secondary | ICD-10-CM | POA: Diagnosis present

## 2014-02-03 DIAGNOSIS — F1120 Opioid dependence, uncomplicated: Principal | ICD-10-CM | POA: Diagnosis present

## 2014-02-03 DIAGNOSIS — F1910 Other psychoactive substance abuse, uncomplicated: Secondary | ICD-10-CM | POA: Diagnosis present

## 2014-02-03 DIAGNOSIS — K219 Gastro-esophageal reflux disease without esophagitis: Secondary | ICD-10-CM | POA: Insufficient documentation

## 2014-02-03 DIAGNOSIS — F17210 Nicotine dependence, cigarettes, uncomplicated: Secondary | ICD-10-CM | POA: Diagnosis present

## 2014-02-03 DIAGNOSIS — F10920 Alcohol use, unspecified with intoxication, uncomplicated: Secondary | ICD-10-CM

## 2014-02-03 DIAGNOSIS — M549 Dorsalgia, unspecified: Secondary | ICD-10-CM | POA: Insufficient documentation

## 2014-02-03 DIAGNOSIS — Z720 Tobacco use: Secondary | ICD-10-CM | POA: Insufficient documentation

## 2014-02-03 DIAGNOSIS — F10120 Alcohol abuse with intoxication, uncomplicated: Secondary | ICD-10-CM | POA: Insufficient documentation

## 2014-02-03 DIAGNOSIS — F1020 Alcohol dependence, uncomplicated: Secondary | ICD-10-CM | POA: Diagnosis present

## 2014-02-03 DIAGNOSIS — F39 Unspecified mood [affective] disorder: Secondary | ICD-10-CM | POA: Diagnosis present

## 2014-02-03 DIAGNOSIS — R45851 Suicidal ideations: Secondary | ICD-10-CM

## 2014-02-03 LAB — ETHANOL: ALCOHOL ETHYL (B): 200 mg/dL — AB (ref 0–11)

## 2014-02-03 LAB — COMPREHENSIVE METABOLIC PANEL
ALBUMIN: 4 g/dL (ref 3.5–5.2)
ALK PHOS: 113 U/L (ref 39–117)
ALT: 65 U/L — ABNORMAL HIGH (ref 0–53)
AST: 32 U/L (ref 0–37)
Anion gap: 19 — ABNORMAL HIGH (ref 5–15)
BILIRUBIN TOTAL: 0.3 mg/dL (ref 0.3–1.2)
BUN: 7 mg/dL (ref 6–23)
CO2: 20 meq/L (ref 19–32)
Calcium: 9.5 mg/dL (ref 8.4–10.5)
Chloride: 99 mEq/L (ref 96–112)
Creatinine, Ser: 0.71 mg/dL (ref 0.50–1.35)
GLUCOSE: 157 mg/dL — AB (ref 70–99)
POTASSIUM: 4.5 meq/L (ref 3.7–5.3)
Sodium: 138 mEq/L (ref 137–147)
Total Protein: 7.6 g/dL (ref 6.0–8.3)

## 2014-02-03 LAB — CBC
HEMATOCRIT: 49.3 % (ref 39.0–52.0)
HEMOGLOBIN: 17.4 g/dL — AB (ref 13.0–17.0)
MCH: 28 pg (ref 26.0–34.0)
MCHC: 35.3 g/dL (ref 30.0–36.0)
MCV: 79.3 fL (ref 78.0–100.0)
Platelets: 398 10*3/uL (ref 150–400)
RBC: 6.22 MIL/uL — ABNORMAL HIGH (ref 4.22–5.81)
RDW: 13.9 % (ref 11.5–15.5)
WBC: 11.8 10*3/uL — AB (ref 4.0–10.5)

## 2014-02-03 LAB — ACETAMINOPHEN LEVEL: Acetaminophen (Tylenol), Serum: <15 ug/mL (ref 10–30)

## 2014-02-03 LAB — RAPID URINE DRUG SCREEN, HOSP PERFORMED
Amphetamines: NOT DETECTED
BARBITURATES: NOT DETECTED
Benzodiazepines: NOT DETECTED
Cocaine: NOT DETECTED
Opiates: NOT DETECTED
TETRAHYDROCANNABINOL: NOT DETECTED

## 2014-02-03 LAB — SALICYLATE LEVEL: Salicylate Lvl: <2 mg/dL — ABNORMAL LOW (ref 2.8–20.0)

## 2014-02-03 MED ORDER — GABAPENTIN 300 MG PO CAPS: 300.0000 mg | ORAL_CAPSULE | Freq: Three times a day (TID) | ORAL | Status: DC

## 2014-02-03 MED ORDER — METOPROLOL TARTRATE 50 MG PO TABS
50.0000 mg | ORAL_TABLET | Freq: Two times a day (BID) | ORAL | Status: DC
  Administered 2014-02-03 – 2014-02-07 (×8): 50 mg via ORAL
  Filled 2014-02-03 (×11): qty 1
  Filled 2014-02-03 (×2): qty 6
  Filled 2014-02-03: qty 1

## 2014-02-03 MED ORDER — ACETAMINOPHEN 325 MG PO TABS: 650.0000 mg | ORAL_TABLET | ORAL | Status: DC | PRN

## 2014-02-03 MED ORDER — HYDROXYZINE HCL 25 MG PO TABS
25.0000 mg | ORAL_TABLET | Freq: Four times a day (QID) | ORAL | Status: DC | PRN
  Administered 2014-02-03 – 2014-02-06 (×4): 25 mg via ORAL
  Filled 2014-02-03 (×4): qty 1
  Filled 2014-02-03: qty 20
  Filled 2014-02-03: qty 1

## 2014-02-03 MED ORDER — ZOLPIDEM TARTRATE 5 MG PO TABS: 5.0000 mg | ORAL_TABLET | Freq: Every evening | ORAL | Status: DC | PRN

## 2014-02-03 MED ORDER — CLONIDINE HCL 0.1 MG PO TABS
0.1000 mg | ORAL_TABLET | Freq: Every day | ORAL | Status: DC
  Administered 2014-02-07: 0.1 mg via ORAL
  Filled 2014-02-03 (×2): qty 1

## 2014-02-03 MED ORDER — MIRTAZAPINE 15 MG PO TABS
15.0000 mg | ORAL_TABLET | Freq: Every day | ORAL | Status: DC
  Administered 2014-02-03: 15 mg via ORAL
  Filled 2014-02-03 (×3): qty 1

## 2014-02-03 MED ORDER — LORAZEPAM 2 MG/ML IJ SOLN: 0.0000 mg | Freq: Two times a day (BID) | INTRAMUSCULAR | Status: DC

## 2014-02-03 MED ORDER — ACETAMINOPHEN 325 MG PO TABS
650.0000 mg | ORAL_TABLET | Freq: Four times a day (QID) | ORAL | Status: DC | PRN
  Administered 2014-02-03: 650 mg via ORAL
  Filled 2014-02-03: qty 2

## 2014-02-03 MED ORDER — MAGNESIUM HYDROXIDE 400 MG/5ML PO SUSP
30.0000 mL | Freq: Every day | ORAL | Status: DC | PRN
  Administered 2014-02-06: 30 mL via ORAL
  Filled 2014-02-03: qty 30

## 2014-02-03 MED ORDER — MELOXICAM 7.5 MG PO TABS
15.0000 mg | ORAL_TABLET | Freq: Every day | ORAL | Status: DC
  Administered 2014-02-04 – 2014-02-07 (×4): 15 mg via ORAL
  Filled 2014-02-03: qty 6
  Filled 2014-02-03 (×3): qty 1
  Filled 2014-02-03: qty 2
  Filled 2014-02-03 (×2): qty 1

## 2014-02-03 MED ORDER — NICOTINE 21 MG/24HR TD PT24: 21.0000 mg | MEDICATED_PATCH | Freq: Every day | TRANSDERMAL | Status: DC

## 2014-02-03 MED ORDER — LORAZEPAM 0.5 MG PO TABS
0.5000 mg | ORAL_TABLET | Freq: Three times a day (TID) | ORAL | Status: DC
  Administered 2014-02-03 – 2014-02-07 (×12): 0.5 mg via ORAL
  Filled 2014-02-03 (×12): qty 1

## 2014-02-03 MED ORDER — GABAPENTIN 300 MG PO CAPS
600.0000 mg | ORAL_CAPSULE | Freq: Three times a day (TID) | ORAL | Status: DC
  Administered 2014-02-03 – 2014-02-07 (×11): 600 mg via ORAL
  Filled 2014-02-03 (×17): qty 2

## 2014-02-03 MED ORDER — LORAZEPAM 0.5 MG PO TABS
0.5000 mg | ORAL_TABLET | Freq: Once | ORAL | Status: AC
  Administered 2014-02-03: 0.5 mg via ORAL
  Filled 2014-02-03: qty 1

## 2014-02-03 MED ORDER — TRAZODONE HCL 50 MG PO TABS
50.0000 mg | ORAL_TABLET | Freq: Every evening | ORAL | Status: DC | PRN
  Administered 2014-02-03: 50 mg via ORAL
  Filled 2014-02-03: qty 1

## 2014-02-03 MED ORDER — CLONIDINE HCL 0.1 MG PO TABS
0.1000 mg | ORAL_TABLET | ORAL | Status: DC
  Administered 2014-02-06 – 2014-02-07 (×3): 0.1 mg via ORAL
  Filled 2014-02-03 (×5): qty 1

## 2014-02-03 MED ORDER — LORAZEPAM 1 MG PO TABS: 0.0000 mg | ORAL_TABLET | Freq: Two times a day (BID) | ORAL | Status: DC

## 2014-02-03 MED ORDER — ALUM & MAG HYDROXIDE-SIMETH 200-200-20 MG/5ML PO SUSP: 30.0000 mL | ORAL | Status: DC | PRN

## 2014-02-03 MED ORDER — LORAZEPAM 1 MG PO TABS: 1.0000 mg | ORAL_TABLET | Freq: Three times a day (TID) | ORAL | Status: DC | PRN

## 2014-02-03 MED ORDER — DICYCLOMINE HCL 20 MG PO TABS: 20.0000 mg | ORAL_TABLET | Freq: Four times a day (QID) | ORAL | Status: DC | PRN

## 2014-02-03 MED ORDER — NAPROXEN 500 MG PO TABS
500.0000 mg | ORAL_TABLET | Freq: Two times a day (BID) | ORAL | Status: DC | PRN
  Administered 2014-02-03 – 2014-02-07 (×6): 500 mg via ORAL
  Filled 2014-02-03 (×6): qty 1

## 2014-02-03 MED ORDER — ALUM & MAG HYDROXIDE-SIMETH 200-200-20 MG/5ML PO SUSP
30.0000 mL | ORAL | Status: DC | PRN
  Administered 2014-02-05 – 2014-02-07 (×4): 30 mL via ORAL
  Filled 2014-02-03 (×4): qty 30

## 2014-02-03 MED ORDER — PANTOPRAZOLE SODIUM 40 MG PO TBEC
40.0000 mg | DELAYED_RELEASE_TABLET | Freq: Two times a day (BID) | ORAL | Status: DC
  Administered 2014-02-03 – 2014-02-07 (×8): 40 mg via ORAL
  Filled 2014-02-03 (×2): qty 1
  Filled 2014-02-03: qty 6
  Filled 2014-02-03 (×3): qty 1
  Filled 2014-02-03: qty 6
  Filled 2014-02-03 (×8): qty 1

## 2014-02-03 MED ORDER — THIAMINE HCL 100 MG/ML IJ SOLN: 100.0000 mg | Freq: Every day | INTRAMUSCULAR | Status: DC

## 2014-02-03 MED ORDER — IBUPROFEN 200 MG PO TABS: 600.0000 mg | ORAL_TABLET | Freq: Three times a day (TID) | ORAL | Status: DC | PRN

## 2014-02-03 MED ORDER — LOSARTAN POTASSIUM 50 MG PO TABS
100.0000 mg | ORAL_TABLET | Freq: Every day | ORAL | Status: DC
  Administered 2014-02-04 – 2014-02-07 (×4): 100 mg via ORAL
  Filled 2014-02-03 (×5): qty 2
  Filled 2014-02-03: qty 28
  Filled 2014-02-03: qty 2

## 2014-02-03 MED ORDER — ONDANSETRON HCL 4 MG PO TABS: 4.0000 mg | ORAL_TABLET | Freq: Three times a day (TID) | ORAL | Status: DC | PRN

## 2014-02-03 MED ORDER — METHOCARBAMOL 500 MG PO TABS
500.0000 mg | ORAL_TABLET | Freq: Three times a day (TID) | ORAL | Status: DC | PRN
  Administered 2014-02-03 – 2014-02-07 (×6): 500 mg via ORAL
  Filled 2014-02-03 (×6): qty 1

## 2014-02-03 MED ORDER — LORAZEPAM 2 MG/ML IJ SOLN: 0.0000 mg | Freq: Four times a day (QID) | INTRAMUSCULAR | Status: DC

## 2014-02-03 MED ORDER — LORAZEPAM 1 MG PO TABS: 0.0000 mg | ORAL_TABLET | Freq: Four times a day (QID) | ORAL | Status: DC

## 2014-02-03 MED ORDER — CLONIDINE HCL 0.1 MG PO TABS
0.1000 mg | ORAL_TABLET | Freq: Four times a day (QID) | ORAL | Status: AC
  Administered 2014-02-03 – 2014-02-05 (×10): 0.1 mg via ORAL
  Filled 2014-02-03 (×13): qty 1

## 2014-02-03 MED ORDER — LOPERAMIDE HCL 2 MG PO CAPS: 2.0000 mg | ORAL_CAPSULE | ORAL | Status: DC | PRN

## 2014-02-03 MED ORDER — VITAMIN B-1 100 MG PO TABS: 100.0000 mg | ORAL_TABLET | Freq: Every day | ORAL | Status: DC

## 2014-02-03 MED ORDER — ONDANSETRON 4 MG PO TBDP: 4.0000 mg | ORAL_TABLET | Freq: Four times a day (QID) | ORAL | Status: DC | PRN

## 2014-02-03 NOTE — BH Assessment (Signed)
Patient accepted to [facility] by Dr. Darleene Cleaver. Pt assigned to bed 302-2. Nursing report [phone]. Pt under IVC. [employer] will transport.

## 2014-02-03 NOTE — Progress Notes (Signed)
Patient ID: Mark [name], male   DOB: 1965/05/14, 48 y.o.   MRN: [id number]   48 year old male presents to [facility] from [facility] Pt has a flat affect and anxious mood. Pt reports anxiety at a 7 and pain at a 9. Pt states that he went to the hospital because "I was dumb and threatened to kill myself and my stepfather sent me here." It was reported to writer that the pt's stepfather brought him to the ED because he was threatening to shoot himself and he has access to firearms at home.  Pt states that he "was here last week" and that he "couldn't get his pain medications under control until his follow up appointment on the 9th." Pt has been drinking every day (last drink was a half a bottle of wine) and taking pain medications. Pt states that he "just wants to go home." Pt oriented to unit, pt's belongings were given and also put into locker. Pt signed the treatment plan.   Elenore Rota, RN

## 2014-02-03 NOTE — Progress Notes (Signed)
Adult Psychoeducational Group Note  Date:  02/03/2014 Time:  8:27 PM  Group Topic/Focus:  Wrap-Up Group:   The focus of this group is to help patients review their daily goal of treatment and discuss progress on daily workbooks.  Participation Level:  Active  Participation Quality:  Appropriate  Affect:  Appropriate  Cognitive:  Appropriate  Insight: Appropriate  Engagement in Group:  Engaged  Modes of Intervention:  Discussion  Additional Comments:  Pt was present for wrap up group. He shared that he had not yet set a goal, and could not think of one for tomorrow. He was unhappy that his medications were not ready yet, but he knew his nurse was working on it.    Harrie Foreman A 02/03/2014, 8:27 PM

## 2014-02-03 NOTE — Tx Team (Signed)
Initial Interdisciplinary Treatment Plan   PATIENT STRESSORS: Financial difficulties Health problems Marital or family conflict Medication change or noncompliance Substance abuse   PATIENT STRENGTHS: Active sense of humor Communication skills General fund of knowledge Supportive family/friends   PROBLEM LIST: Problem List/Patient Goals Date to be addressed Date deferred Reason deferred Estimated date of resolution  Anxiety 02/03/2014     "All of my pain" 02/03/2014     Suicidal Ideation 02/03/2014     Alcohol/Opiod Abuse 02/03/2014                                    DISCHARGE CRITERIA:  Improved stabilization in mood, thinking, and/or behavior Medical problems require only outpatient monitoring Verbal commitment to aftercare and medication compliance Withdrawal symptoms are absent or subacute and managed without 24-hour nursing intervention  PRELIMINARY DISCHARGE PLAN: Attend aftercare/continuing care group Attend 12-step recovery group Outpatient therapy Placement in alternative living arrangements  PATIENT/FAMIILY INVOLVEMENT: This treatment plan has been presented to and reviewed with the patient, Mark [name]  The patient and family have been given the opportunity to ask questions and make suggestions.  Elenore Rota 02/03/2014, 5:57 PM

## 2014-02-03 NOTE — Progress Notes (Signed)
Patient ID: Mark [name], male   DOB: September 04, 1965, 48 y.o.   MRN: [id number]  Pt states that his "anxiety is really bad. I just need an ativan right now." Pt currently denies SI/HI and A/VH stating that "I shouldn't have threatened or all this wouldn't have happened." Pt is very anxious and easily agitated. Pt is exhibiting medication seeking behaviors. Pt reports history of alcohol and pain medication abuse. Pt's pulse was high upon admission and Clonidine protocol was started. First dose given by Probation officer.   Pt did no put his mom on the consent phone call list. Mom has called the unit multiple times and writer was not able to give her any information. Mom reports to Probation officer that "he is telling me he will be let out soon." and that "when he is discharged she does not want him to come home." She also states that "he needs long term treatment or to stay because his history shows that he keeps doing this." She said that he " held a knife to his throat and threatened to kill himself." She also states that she is not the pt's POA. The pt's ex wife was his POA but she says that she does not have the documents anymore."   Pt given scheduled and PRN medications per providers orders. Pt supported and encouraged emotionally. Pt's vitals and labs monitored. Pt currently denies SI/HI and A/VH. Pt verbally contracts to contact staff before acting on these thoughts.

## 2014-02-03 NOTE — ED Provider Notes (Signed)
CSN: [id number]     Arrival date & time 02/03/14  1021 History   First MD Initiated Contact with Patient 02/03/14 1022     Chief Complaint  Patient presents with  . Suicidal     (Consider location/radiation/quality/duration/timing/severity/associated sxs/prior Treatment) HPI Comments: Patient is a 48 year old male with history of hypertension, hyperlipidemia, opiate dependence, alcohol abuse, hepatitis presents the emergency department today for evaluation of suicidal ideation. He is brought in by his stepfather who is very concerned. His stepfather reports that he told him he wanted to put a gun to his head and die. The patient has access to guns at home. The patient now denies suicidal ideation. He reports that he is angry and "would do anything to get out of the pain". His pain is chronic in nature and unchanged. His pain is from head to toe. He reports that he did drink alcohol last night to improve his pain. He reports that his last drink was yesterday. He denies any alcohol today. He denies any drug abuse.  The history is provided by the patient and a relative. No language interpreter was used.    Past Medical History  Diagnosis Date  . Hypertension   . Hyperlipidemia   . 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 ETOH abuse   . GERD (gastroesophageal reflux disease)   . Mental disorder   . Irregular heart beat   . Anxiety   . Shortness of breath   . Headache(784.0)   . Hepatitis   . Withdrawal seizures   . Alcohol withdrawal   . Neuropathy 06/07/2013  . 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 Seizure due to alcohol withdrawal 01/23/2014    Per patient report   Past Surgical History  Procedure Laterality Date  . Back surgery    . Neck surgery    . Fundoplasty transthoracic  2003  . Nasal sinus surgery     Family History  Problem Relation Age of Onset  . Breast cancer Paternal Grandmother   . Lung cancer      was a smoker  . Hypertension  Mother    History  Substance Use Topics  . Smoking status: Current Every Day Smoker -- 0.50 packs/day for 30 years    Types: Cigarettes  . Smokeless tobacco: Never Used  . Alcohol Use: 4.2 oz/week    7 Shots of liquor per week     Comment: occasionally     Review of Systems  Constitutional: Negative for fever and chills.       Diffuse body pain  Respiratory: Negative for shortness of breath.   Cardiovascular: Negative for chest pain.  Gastrointestinal: Negative for nausea, vomiting and abdominal pain.  Musculoskeletal: Positive for myalgias, back pain and arthralgias.  Psychiatric/Behavioral: Positive for suicidal ideas and sleep disturbance.  All other systems reviewed and are negative.     Allergies  Lisinopril  Home Medications   Prior to Admission medications   Medication Sig Start Date End Date Taking? Authorizing Provider  albuterol (PROVENTIL HFA;VENTOLIN HFA) 108 (90 BASE) MCG/ACT inhaler Inhale 2 puffs into the lungs every 4 (four) hours as needed for wheezing or shortness of breath. 01/28/14  Yes Encarnacion Slates, NP  gabapentin (NEURONTIN) 400 MG capsule Take 800 mg by mouth 3 (three) times daily.   Yes Historical Provider, MD  metoprolol (LOPRESSOR) 50 MG tablet Take 1 tablet (50 mg total) by mouth 2 (two) times daily. For high blood  pressure Patient taking differently: Take 100 mg by mouth daily. For high blood pressure 01/28/14  Yes Encarnacion Slates, NP  gabapentin (NEURONTIN) 300 MG capsule Take 2 capsules (600 mg total) by mouth 3 (three) times daily. For alcohol withdrawal syndrome Patient not taking: Reported on 02/03/2014 01/28/14   Encarnacion Slates, NP  hydrOXYzine (ATARAX/VISTARIL) 50 MG tablet Take 1 tablet (50 mg total) by mouth every 8 (eight) hours as needed for anxiety. Patient not taking: Reported on 02/03/2014 01/28/14   Encarnacion Slates, NP  lidocaine (LIDODERM) 5 % Place 1 patch onto the skin daily. Remove & Discard patch within 12 hours or as directed by MD:  For pain management Patient not taking: Reported on 02/03/2014 01/28/14   Encarnacion Slates, NP  LORazepam (ATIVAN) 0.5 MG tablet Take 1 tablet (0.5 mg total) by mouth 3 (three) times daily. For anxiety Patient not taking: Reported on 02/03/2014 01/28/14   Encarnacion Slates, NP  meloxicam (MOBIC) 15 MG tablet Take 1 tablet (15 mg total) by mouth daily. For arthritic pain Patient not taking: Reported on 02/03/2014 01/28/14   Encarnacion Slates, NP  mirtazapine (REMERON) 15 MG tablet Take 1 tablet (15 mg total) by mouth at bedtime. For depression/insomnia Patient not taking: Reported on 02/03/2014 01/28/14   Encarnacion Slates, NP  nicotine (NICODERM CQ - DOSED IN MG/24 HOURS) 14 mg/24hr patch Place 1 patch (14 mg total) onto the skin daily. For nicotine addiction Patient not taking: Reported on 02/03/2014 01/28/14   Encarnacion Slates, NP  pantoprazole (PROTONIX) 40 MG tablet Take 1 tablet (40 mg total) by mouth 2 (two) times daily before a meal. For acid reflux Patient not taking: Reported on 02/03/2014 01/28/14   Encarnacion Slates, NP   BP 127/97 mmHg  Pulse 108  Temp(Src) 97.7 F (36.5 C) (Oral)  Resp 20  SpO2 98% Physical Exam  Constitutional: He is oriented to person, place, and time. He appears well-developed and well-nourished. No distress.  HENT:  Head: Normocephalic and atraumatic.  Right Ear: External ear normal.  Left Ear: External ear normal.  Nose: Nose normal.  Eyes: Conjunctivae are normal.  Neck: Normal range of motion. No tracheal deviation present.  Cardiovascular: Normal rate, regular rhythm and normal heart sounds.   Pulmonary/Chest: Effort normal and breath sounds normal. No stridor.  Abdominal: Soft. He exhibits no distension. There is no tenderness.  Musculoskeletal: Normal range of motion.  Neurological: He is alert and oriented to person, place, and time.  Skin: Skin is warm and dry. He is not diaphoretic.  Psychiatric: His affect is angry. He is agitated. He expresses no homicidal  ideation. He expresses no homicidal plans.  Patient is now denying being homicidal or suicidal  Nursing note and vitals reviewed.   ED Course  Procedures (including critical care time) Labs Review Labs Reviewed  CBC - Abnormal; Notable for the following:    WBC 11.8 (*)    RBC 6.22 (*)    Hemoglobin 17.4 (*)    All other components within normal limits  COMPREHENSIVE METABOLIC PANEL - Abnormal; Notable for the following:    Glucose, Bld 157 (*)    ALT 65 (*)    Anion gap 19 (*)    All other components within normal limits  ETHANOL - Abnormal; Notable for the following:    Alcohol, Ethyl (B) 200 (*)    All other components within normal limits  SALICYLATE LEVEL - Abnormal; Notable for the following:  Salicylate Lvl <5.9 (*)    All other components within normal limits  ACETAMINOPHEN LEVEL  URINE RAPID DRUG SCREEN (HOSP PERFORMED)    Imaging Review No results found.   EKG Interpretation None      MDM   Final diagnoses:  Suicidal ideation  Alcohol intoxication, uncomplicated  Chronic pain   Patient presents to ED for evaluation of suicidal ideation. He told his father that he wanted to shoot himself in the head with a gun. He has access to guns at home. Currently he denies SI because he wants to leave. Patient is irritated and angry. IVC paperwork completed. Patient accepted to [facility]. Discussed case with Dr. Venora Maples who agrees with plan. Patient / Family / Caregiver informed of clinical course, understand medical decision-making process, and agree with plan.     Elwyn Lade, PA-C 02/03/14 [place], MD 02/05/14 [phone]

## 2014-02-03 NOTE — ED Notes (Signed)
Pt's father bringing pt in for threatening suicide.  Pt admits to drinking some alcohol.  Pt states that "plan between me and GOd".  Pt has chronic pain and fibromyalgia.  Pt states that he was here yesterday and he "isnt sitting in chair for 39mins waiting".

## 2014-02-03 NOTE — BH Assessment (Addendum)
Assessment Note  Mark [name] is an 48 y.o. male with hx of alcohol dependence and anxiety. He was brought to the ED by his step father. Per his stepfather patient as chronic pain issues. Sts that this morning he was in a lot of pain. Patient therefore told his father that he was suicidal and would shoot self in the head. Patient reported that he was only in pain and  speaking from frustration. Patient denies current SI, HI, and AVH's. Patient denies hx of SI, HI, and AVH's.  Patient does not have a current outpatient provider. He does have a upcoming appointment at [facility].   Patient denies drug use. Patient has a hx of alcohol dependence. Sts that for the past several months he drinks socially only. Last drink was yesterday.     Axis II: Mood Disorder NOS and Anxiety Disorder NOS Axis III:  Past Medical History  Diagnosis Date  . Hypertension   . Hyperlipidemia   . 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 ETOH abuse   . GERD (gastroesophageal reflux disease)   . Mental disorder   . Irregular heart beat   . Anxiety   . Shortness of breath   . Headache(784.0)   . Hepatitis   . Withdrawal seizures   . Alcohol withdrawal   . Neuropathy 06/07/2013  . 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 Seizure due to alcohol withdrawal 01/23/2014    Per patient report   Axis IV: other psychosocial or environmental problems, problems related to social environment, problems with access to health care services and problems with primary support group Axis V: 31-40 impairment in reality testing  Past Medical History:  Past Medical History  Diagnosis Date  . Hypertension   . Hyperlipidemia   . 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 ETOH abuse   . GERD (gastroesophageal reflux disease)   . Mental disorder   . Irregular heart beat   . Anxiety   . Shortness of breath   . Headache(784.0)   .  Hepatitis   . Withdrawal seizures   . Alcohol withdrawal   . Neuropathy 06/07/2013  . 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 Seizure due to alcohol withdrawal 01/23/2014    Per patient report    Past Surgical History  Procedure Laterality Date  . Back surgery    . Neck surgery    . Fundoplasty transthoracic  2003  . Nasal sinus surgery      Family History:  Family History  Problem Relation Age of Onset  . Breast cancer Paternal Grandmother   . Lung cancer      was a smoker  . Hypertension Mother     Social History:  reports that he has been smoking Cigarettes.  He has a 15 pack-year smoking history. He has never used smokeless tobacco. He reports that he drinks about 4.2 oz of alcohol per week. He reports that he uses illicit drugs.  Additional Social History:  Alcohol / Drug Use Pain Medications: yes, overtakes at times and tried to get opiated from another provider and vilotated his contract with his PCIP so PCP stopped working with him Prescriptions: has abused Over the Counter: not abusing History of alcohol / drug use?: Yes Longest period of sobriety (when/how long): unknown Substance #1 Name of Substance 1: Alcohol 1 - Age of First Use: unknown 1 -  Amount (size/oz): one fifth liquor 1 - Frequency: daily (socially) 1 - Duration: ongoing 1 - Last Use / Amount: today - 1 bottle wine (yesterday; 1 bottle of wine)  CIWA: CIWA-Ar BP: 117/77 mmHg Pulse Rate: (!) 130 COWS:    Allergies:  Allergies  Allergen Reactions  . Lisinopril Anaphylaxis and Swelling    Per Dr. Terrence Dupont (12/20/13), patient can tolerate losartan.    Home Medications:  (Not in a hospital admission)  OB/GYN Status:  No LMP for male patient.  General Assessment Data Location of Assessment: [facility] ED Is this a Tele or Face-to-Face Assessment?: Face-to-Face Is this an Initial Assessment or a Re-assessment for this encounter?: Initial Assessment Living Arrangements: Other (Comment) (pt lives with step  father and mother ) Can pt return to current living arrangement?: No Admission Status: Voluntary Is patient capable of signing voluntary admission?: Yes Transfer from: [facility] Referral Source: Self/Family/Friend  Medical Screening Exam ([place]) Medical Exam completed: No  [facility] Crisis Care Plan Living Arrangements: Other (Comment) (pt lives with step father and mother )  Education Status Is patient currently in school?: No  Risk to self with the past 6 months Suicidal Ideation: No-Not Currently/Within Last 6 Months (pt edorsed suicidal ideations to his step father this mornin) Suicidal Intent: No-Not Currently/Within Last 6 Months (reported that he would shoot self in the head w/ gun this am) Is patient at risk for suicide?: No Suicidal Plan?: Yes-Currently Present Specify Current Suicidal Plan:  (shoot self in the head with gun) Access to Means: Yes Specify Access to Suicidal Means:  (acess to a gun ) What has been your use of drugs/alcohol within the last 12 months?:  (patient reports social alcohol use; has a hx of heavy alcoho) Previous Attempts/Gestures: No How many times?:  (n/a) Other Self Harm Risks:  (n/a) Triggers for Past Attempts: None known Intentional Self Injurious Behavior: None Family Suicide History: Unknown Recent stressful life event(s): Other (Comment) (pt ) Persecutory voices/beliefs?: Yes Depression: Yes Depression Symptoms: Despondent, Tearfulness, Isolating, Fatigue, Loss of interest in usual pleasures, Feeling worthless/self pity, Guilt, Feeling angry/irritable, Insomnia Substance abuse history and/or treatment for substance abuse?: No Suicide prevention information given to non-admitted patients: Not applicable  Risk to Others within the past 6 months Homicidal Ideation: No Thoughts of Harm to Others: No Current Homicidal Intent: No Current Homicidal Plan: No Access to Homicidal Means: No Identified Victim:  (n/a) History of harm  to others?: No Assessment of Violence: None Noted Violent Behavior Description:  (patient calm and cooperative ) Does patient have access to weapons?: No Criminal Charges Pending?: No Does patient have a court date: No  Psychosis Hallucinations: None noted Delusions: None noted  Mental Status Report Appear/Hygiene: Disheveled Eye Contact: Good Motor Activity: Freedom of movement Speech: Logical/coherent Level of Consciousness: Alert, Irritable Mood: Depressed Affect: Appropriate to circumstance Anxiety Level: Panic Attacks Panic attack frequency:  (pt reports frequent panic attacks) Most recent panic attack:  (this morning ) Thought Processes: Coherent, Relevant Judgement: Impaired Orientation: Person, Place, Situation Obsessive Compulsive Thoughts/Behaviors: None  Cognitive Functioning Concentration: Normal Memory: Recent Intact, Remote Intact IQ: Average Insight: Poor Impulse Control: Fair Appetite: Good Weight Loss:  (none reported ) Weight Gain:  (none reported ) Sleep: No Change Total Hours of Sleep:  (varies ) Vegetative Symptoms: None  ADLScreening [facility] Assessment Services) Patient's cognitive ability adequate to safely complete daily activities?: Yes Patient able to express need for assistance with ADLs?: No Independently performs ADLs?: Yes (appropriate for developmental  age)  Prior Inpatient Therapy Prior Inpatient Therapy: Yes Prior Therapy Dates: 01/28/14 Prior Therapy Facilty/Provider(s): [facility] OBS Reason for Treatment: Alcohol Detos  Prior Outpatient Therapy Prior Outpatient Therapy: No Prior Therapy Dates: na Prior Therapy Facilty/Provider(s): na Reason for Treatment: na  ADL Screening (condition at time of admission) Patient's cognitive ability adequate to safely complete daily activities?: Yes Is the patient deaf or have difficulty hearing?: No Does the patient have difficulty seeing, even when wearing glasses/contacts?: No Does the patient  have difficulty concentrating, remembering, or making decisions?: Yes Patient able to express need for assistance with ADLs?: No Does the patient have difficulty dressing or bathing?: Yes Independently performs ADLs?: Yes (appropriate for developmental age) Does the patient have difficulty walking or climbing stairs?: No Weakness of Legs: None Weakness of Arms/Hands: None  Home Assistive Devices/Equipment Home Assistive Devices/Equipment: None    Abuse/Neglect Assessment (Assessment to be complete while patient is alone) Physical Abuse: Denies Verbal Abuse: Denies Sexual Abuse: Denies Exploitation of patient/patient's resources: Denies Self-Neglect: Denies Values / Beliefs Cultural Requests During Hospitalization: None Spiritual Requests During Hospitalization: None   Advance Directives (For Healthcare) Does patient have an advance directive?: No, Yes Would patient like information on creating an advanced directive?: No - patient declined information Type of Advance Directive: Healthcare Power of [place] Adult/[facility] Patient's home diet: Regular  Additional Information 1:1 In Past 12 Months?: No CIRT Risk: No Elopement Risk: No Does patient have medical clearance?: No     Disposition:  Disposition Initial Assessment Completed for this Encounter: Yes Disposition of Patient: Inpatient treatment program Type of inpatient treatment program: Adult Other disposition(s): Other (Comment) (Dr. Darleene Cleaver recommends inpt tx; TTS to seek appropriate pla)  On Site Evaluation by:   Reviewed with Physician:    Waldon Merl [facility] 02/03/2014 12:19 PM

## 2014-02-03 NOTE — ED Notes (Signed)
Pt demanding pain meds so he can sleep. Pt states "I haven't slept in months. All they gave me was Toradol yesterday".   RN explained to pt that RN's cant order pain medications and that would be up to the provider seeing him.

## 2014-02-03 NOTE — Progress Notes (Signed)
D:Patient in the hallway on approach.  Patient intrusive and wanting medications as nurses are in report.  When writer talked to patient, patient is requesting more ativan and Seroquel.  Patient states he is having withdrawals and states he feels miserable.  Patient states that none of the medications he takes work but is still requesting them.  Patient states he is only here because he made a stupid mistake and he did not mean it.  Patient denies SI/HI and denies AVH.  Patient states he would like to leave because he is not getting what he needs here.  Patient states he was just here last week.    A: Staff to monitor Q 15 mins for safety.  Encouragement and support offered.  Scheduled medications administered per orders.  Trazodone and Naproxen administered prn tonight. R: Patient remains safe on the unit.  Patient did not attend group tonight.  Patient taking administered medications.  Patient visible on the unit and constantly standing a the medications room after being told that writer has called the PA and after medications were given.

## 2014-02-04 DIAGNOSIS — F1994 Other psychoactive substance use, unspecified with psychoactive substance-induced mood disorder: Secondary | ICD-10-CM

## 2014-02-04 DIAGNOSIS — R45851 Suicidal ideations: Secondary | ICD-10-CM

## 2014-02-04 DIAGNOSIS — F332 Major depressive disorder, recurrent severe without psychotic features: Secondary | ICD-10-CM | POA: Diagnosis present

## 2014-02-04 MED ORDER — ENSURE COMPLETE PO LIQD
237.0000 mL | Freq: Two times a day (BID) | ORAL | Status: DC
  Administered 2014-02-06 – 2014-02-07 (×3): 237 mL via ORAL

## 2014-02-04 MED ORDER — QUETIAPINE FUMARATE 25 MG PO TABS
25.0000 mg | ORAL_TABLET | Freq: Three times a day (TID) | ORAL | Status: DC
  Administered 2014-02-04 – 2014-02-05 (×5): 25 mg via ORAL
  Filled 2014-02-04 (×10): qty 1

## 2014-02-04 MED ORDER — DULOXETINE HCL 20 MG PO CPEP
20.0000 mg | ORAL_CAPSULE | Freq: Every day | ORAL | Status: DC
  Administered 2014-02-04 – 2014-02-07 (×4): 20 mg via ORAL
  Filled 2014-02-04 (×6): qty 1

## 2014-02-04 MED ORDER — LIDOCAINE 5 % EX PTCH
1.0000 | MEDICATED_PATCH | Freq: Every day | CUTANEOUS | Status: DC
  Administered 2014-02-04 – 2014-02-07 (×4): 1 via TRANSDERMAL
  Filled 2014-02-04 (×3): qty 1
  Filled 2014-02-04: qty 3
  Filled 2014-02-04 (×2): qty 1

## 2014-02-04 MED ORDER — NICOTINE POLACRILEX 2 MG MT GUM
2.0000 mg | CHEWING_GUM | OROMUCOSAL | Status: DC | PRN
Start: 2014-02-04 — End: 2014-02-07
  Administered 2014-02-04 – 2014-02-06 (×12): 2 mg via ORAL
  Filled 2014-02-04: qty 1

## 2014-02-04 MED ORDER — TRAZODONE HCL 100 MG PO TABS
100.0000 mg | ORAL_TABLET | Freq: Every evening | ORAL | Status: DC | PRN
  Administered 2014-02-04 – 2014-02-06 (×5): 100 mg via ORAL
  Filled 2014-02-04 (×4): qty 1
  Filled 2014-02-04: qty 28
  Filled 2014-02-04: qty 1

## 2014-02-04 NOTE — BHH Suicide Risk Assessment (Signed)
[name] INPATIENT:  Family/Significant Other Suicide Prevention Education  Suicide Prevention Education:  Patient Refusal for Family/Significant Other Suicide Prevention Education: The patient Mark [name] has refused to provide written consent for family/significant other to be provided Family/Significant Other Suicide Prevention Education during admission and/or prior to discharge.  Physician notified. SPE reviewed with patient, brochure provided.  Mark [name], Casimiro Needle 02/04/2014, 12:25 PM

## 2014-02-04 NOTE — Progress Notes (Signed)
NUTRITION ASSESSMENT  Pt identified as at risk on the Malnutrition Screen Tool  INTERVENTION: 1. Educated patient on the importance of nutrition and encouraged intake of food and beverages. 2. Discussed weight goals. 3. Supplements: Ensure Complete po BID, each supplement provides 350 kcal and 13 grams of protein   NUTRITION DIAGNOSIS: Unintentional weight loss related to sub-optimal intake as evidenced by pt report.   Goal: Pt to meet >/= 90% of their estimated nutrition needs.  Monitor:  PO intake  Assessment:  Patient admitted for etoh abuse and uses opiates but states that he is not here for etoh and relapsed on etoh 2 days prior to admit secondary to not having pain meds.  States that he is here for pain control.    Reports good intake her and generally prior to admit.  Eats small frequent meals.  Reports that if he eats more then his stomach hurts.  Hx of GERD and [employer] Fundoplication per patient.  UBW 210-220 lbs.  200 lbs 2 weeks ago and increased to 205 lbs.  E-chart does not confirm stated weight hx.  UBW per e-chart is 200 lbs.    48 y.o. male  Height: Ht Readings from Last 1 Encounters:  02/03/14 6\' 4"  (1.93 m)    Weight: Wt Readings from Last 1 Encounters:  02/03/14 205 lb (92.987 kg)    Weight Hx: Wt Readings from Last 10 Encounters:  02/03/14 205 lb (92.987 kg)  01/22/14 200 lb 8 oz (90.946 kg)  01/13/14 218 lb (98.884 kg)  12/21/13 203 lb 1.6 oz (92.126 kg)  05/28/13 206 lb 6.4 oz (93.622 kg)  01/21/13 197 lb (89.359 kg)  01/20/13 200 lb (90.719 kg)  12/28/12 197 lb (89.359 kg)  10/14/12 200 lb (90.719 kg)  06/25/12 185 lb 11.2 oz (84.233 kg)    BMI:  Body mass index is 24.96 kg/(m^2). Pt meets criteria for normal weight based on current BMI.  Estimated Nutritional Needs: Kcal: 25-30 kcal/kg Protein: > 1 gram protein/kg Fluid: 1 ml/kcal  Diet Order: Diet regular Pt is also offered choice of unit snacks mid-morning and mid-afternoon.  Pt is  eating as desired.   Lab results and medications reviewed.   Antonieta Iba, RD, LDN Clinical Inpatient Dietitian Pager:  [phone] Weekend and after hours pager:  [phone]

## 2014-02-04 NOTE — BHH Group Notes (Signed)
[facility] LCSW Aftercare Discharge Planning Group Note  02/04/2014  8:45 AM  Participation Quality: Did Not Attend for unknown reason.  Tilden Fossa, MSW, [place] Worker [facility] [phone]

## 2014-02-04 NOTE — BHH Suicide Risk Assessment (Signed)
   Nursing information obtained from:  Patient Demographic factors:  Male, Caucasian, Divorced or widowed, Low socioeconomic status Current Mental Status:  NA Loss Factors:  Financial problems / change in socioeconomic status Historical Factors:  Prior suicide attempts Risk Reduction Factors:  NA Total Time spent with patient: 1 hour  CLINICAL FACTORS:   Depression:   Comorbid alcohol abuse/dependence Hopelessness Impulsivity Alcohol/Substance Abuse/Dependencies More than one psychiatric diagnosis Previous Psychiatric Diagnoses and Treatments  Psychiatric Specialty Exam: Physical Exam  ROS  Blood pressure 141/100, pulse 116, temperature 98.2 F (36.8 C), temperature source Oral, resp. rate 18, height 6\' 4"  (1.93 m), weight 92.987 kg (205 lb), SpO2 98 %.Body mass index is 24.96 kg/(m^2).  General Appearance: Disheveled  Eye Sport and exercise psychologist::  Fair  Speech:  Slow  Volume:  Normal  Mood:  Depressed, Dysphoric, Hopeless and Irritable  Affect:  Labile  Thought Process:  Logical  Orientation:  Full (Time, Place, and Person)  Thought Content:  Rumination  Suicidal Thoughts:  Yes.  without intent/plan  Homicidal Thoughts:  No  Memory:  Immediate;   Fair Recent;   Fair Remote;   Fair  Judgement:  Impaired  Insight:  Lacking  Psychomotor Activity:  Increased  Concentration:  Fair  Recall:  [place]: Fair  Akathisia:  No  Handed:  Right  AIMS (if indicated):     Assets:  Communication Skills Housing  Sleep:      Musculoskeletal: Strength & Muscle Tone: within normal limits Gait & Station: normal Patient leans: N/A  COGNITIVE FEATURES THAT CONTRIBUTE TO RISK:  Closed-mindedness Loss of executive function    SUICIDE RISK:   Moderate:  Frequent suicidal ideation with limited intensity, and duration, some specificity in terms of plans, no associated intent, good self-control, limited dysphoria/symptomatology, some risk factors present, and  identifiable protective factors, including available and accessible social support.  PLAN OF CARE:  I certify that inpatient services furnished can reasonably be expected to improve the patient's condition.  Montravious Weigelt T. 02/04/2014, 10:08 AM

## 2014-02-04 NOTE — B[facility] Counselor (Signed)
Expand All Collapse All   Adult Comprehensive Assessment  Patient ID: Mark [name], male DOB: 28-Mar-1965, 48 y.o. MRN: [id number]  Information Source: Information source: Patient  Current Stressors:  Educational / Learning stressors: NA Employment / Job issues: Unemployed; in the process of applying for disability Family Relationships: Some strain with Step father as the two do not speak although they live in the same home Financial / Lack of resources (include bankruptcy): Strained Housing / Lack of housing: Lives with parents Physical health (include injuries & life threatening diseases): Chronic foot pain he reports is not diabetic yet related to alcohol use Social relationships: Isolates except from family Substance abuse: Ongoing alcohol use to cope with chronic pain Bereavement / Loss: NA  Living/Environment/Situation:  Living Arrangements: Parent Living conditions (as described by patient or guardian): Good supportive home How long has patient lived in current situation?: 3 years What is atmosphere in current home: Comfortable, Supportive, Loving  Family History:  Marital status: Separated Separated, when?: 2011 What types of issues is patient dealing with in the relationship?: Patient's drinking Additional relationship information: She is supportive if I don't drink Does patient have children?: Yes How many children?: 2 How is patient's relationship with their children?: Good with two daughters  Childhood History:  By whom was/is the patient raised?: Both parents Additional childhood history information: Father died in his early 66's due to alcohol issues Description of patient's relationship with caregiver when they were a child: Good with both Patient's description of current relationship with people who raised him/her: Good with mom Does patient have siblings?: Yes Number of Siblings: 1 Description of patient's current relationship with  siblings: Distant with sister Did patient suffer any verbal/emotional/physical/sexual abuse as a child?: No Did patient suffer from severe childhood neglect?: No Has patient ever been sexually abused/assaulted/raped as an adolescent or adult?: No Was the patient ever a victim of a crime or a disaster?: No Witnessed domestic violence?: No Has patient been effected by domestic violence as an adult?: No  Education:  Highest grade of school patient has completed: 14 Currently a Ship broker?: No Learning disability?: No  Employment/Work Situation:  Employment situation: Unemployed (In process of filing for disability for second time) Patient's job has been impacted by current illness: No What is the longest time patient has a held a job?: 10 years; Self employed Development worker, 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 Pt reports the last several weeks he has been consuming increased amounts of alcohol (2 bottles of wine a day, a 1/2 to whole fifth, hard lemonades or liquor in excess) Pt was also taking Xanax 2 x daily Alcohol/Substance Abuse Treatment Hx: Past detox If yes, describe treatment: Detox 01/2014 Has alcohol/substance abuse ever caused legal problems?: No  Social Support System:  Patient's Community Support System: Fair Describe Community Support System: Mom and ex wife (only separated) and 2 daughters Type of faith/religion: Darrick Meigs How does  patient's faith help to  cope with current illness?: Prayer helps in addition to occasional church attendance   Leisure/Recreation:  Leisure and Hobbies: Caring for puppy  Strengths/Needs:  What things does the patient do well?: Good cook In what areas does patient struggle / problems for patient: Alcohol and physical pain  Discharge Plan:  Does patient have access to transportation?: Yes Will patient be returning to same living situation after discharge?: Yes Currently receiving community mental health services: No If no, would patient like referral for services when discharged?: Yes (What county?) ([place]; Perhaps referral to [employer] as patient did not do well at [employer]) Does patient have financial barriers related to discharge medications?: Yes Patient description of barriers related to discharge medications: Difficulty paying for medication if not covered at [facility] and [place]  Summary/Recommendations:  Summary and Recommendations (to be completed by the evaluator): Patient is 48 YO separated unemployed caucasian male admitted with diagnosis of Substance Induced Mood Disorder, Alcohol Dependence Severe and Opoid Dependence.Patient recently admitted to [facility] for similar issues earlier this month. Patient reports that before coming into hospital, he had a bad night dealing with chronic pain and said some things that he didn't mean to his family. He reports no SI/HI at this time. He reports daily drinking to manage pain symptoms. He denies interest in residential treatment at this time and plans to discharge home to follow up with [place] as well as [facility] and a pain clinic. Patient would benefit from crisis stabilization, medication evaluation, therapy groups for processing thoughts/feelings/experiences, psycho ed groups for increasing coping skills, and aftercare planning.   Tilden Fossa, MSW,  [employer] Work[facility] [phone]
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
## 2014-02-05 DIAGNOSIS — F332 Major depressive disorder, recurrent severe without psychotic features: Secondary | ICD-10-CM

## 2014-02-05 DIAGNOSIS — F1019 Alcohol abuse with unspecified alcohol-induced disorder: Secondary | ICD-10-CM

## 2014-02-05 MED ORDER — QUETIAPINE FUMARATE 50 MG PO TABS
50.0000 mg | ORAL_TABLET | Freq: Three times a day (TID) | ORAL | Status: DC
  Administered 2014-02-05 – 2014-02-07 (×6): 50 mg via ORAL
  Filled 2014-02-05 (×9): qty 1
  Filled 2014-02-05 (×3): qty 70

## 2014-02-05 MED ORDER — QUETIAPINE FUMARATE 100 MG PO TABS
100.0000 mg | ORAL_TABLET | Freq: Every day | ORAL | Status: DC
  Administered 2014-02-05 – 2014-02-06 (×2): 100 mg via ORAL
  Filled 2014-02-05 (×5): qty 1

## 2014-02-05 NOTE — BHH Group Notes (Signed)
[facility] LCSW Group Therapy 02/05/2014 11:14 AM  Type of Therapy and Topic: Group Therapy: Avoiding Self-Sabotaging and Enabling Behaviors   Participation Level: Active sometimes intrusive  Mood: Did not state  Description of Group:  Learn how to identify obstacles, self-sabotaging and enabling behaviors, what are they, why do we do them and what needs do these behaviors meet? Discuss unhealthy relationships and how to have positive healthy boundaries with those that sabotage and enable. Explore aspects of self-sabotage and enabling in yourself and how to limit these self-destructive behaviors in everyday life. A scaling question is used to help patient look at where they are now in their motivation to change, from 1 to 10 (lowest to highest motivation).   Therapeutic Goals:  1. Patient will identify one obstacle that relates to self-sabotage and enabling behaviors 2. Patient will identify one personal self-sabotaging or enabling behavior they did prior to admission 3. Patient able to establish a plan to change the above identified behavior they did prior to admission:  4. Patient will demonstrate ability to communicate their needs through discussion and/or role plays.  Summary of Patient Progress:  Pt participated in group discussed but appeared irritable as he challenged CSW several times saying, "that's not true," or "that's bullcrap" and would proceed to discuss an issue that was vaguely related to statement of CSW. Pt talked at length about the inconvenience and broken state of the health care and mental health care system. Pt repeatedly spoke about how hard it has been to get a doctors appointment or pain medication to treat his chronic pain. Pt minimizes his alcohol and substance use stating that it is solely for "escape from the pain" and not "for fun at all." Pt also minimizes his self-sabotaging behaviors as necessary due to lack of other options. Pt demonstrates limited insight AEB by these  behaviors.   Therapeutic Modalities:  Cognitive Behavioral Therapy  Person-Centered Therapy  Motivational Interviewing   Peri Maris, [place] 02/05/2014 11:14 AM

## 2014-02-05 NOTE — Progress Notes (Signed)
[facility] MD Progress Note  02/05/2014 1:45 PM Mark [name]  MRN:  [id number]  Subjective: Nethan reports, "If it has not been the high anxiety I have going on, I would say, I feel wonderful. I'm ready to go home. I was drinking a lot to numb my pain episodes, then said the wrong thing because I was drunk and here I'm".  O: Patient seen, chart reviewed. Colie is visible on the unit. He is participating in group sessions. He says he feels he is ready to go home. Says has appointment on both Monday & Tuesday next week at the pain clinic. Says he has good support system.  iagnosis:   DSM5: Schizophrenia Disorders:  NA Obsessive-Compulsive Disorders:  NA Trauma-Stressor Disorders:  NA Substance/Addictive Disorders:  Alcohol Related Disorder - Moderate (303.90) Depressive Disorders:  Major depressive disorder, recurrent, severe without psychotic features  Total Time spent with patient: 30 minutes  Axis I: Alcohol related disorder, Major depressive disorder, recurrent, severe without psychotic features Axis II: Deferred Axis III:  Past Medical History  Diagnosis Date  . Hypertension   . Hyperlipidemia   . 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 ETOH abuse   . GERD (gastroesophageal reflux disease)   . Mental disorder   . Irregular heart beat   . Anxiety   . Shortness of breath   . Headache(784.0)   . Hepatitis   . Withdrawal seizures   . Alcohol withdrawal   . Neuropathy 06/07/2013  . 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 Seizure due to alcohol withdrawal 01/23/2014    Per patient report   Axis IV: other psychosocial or environmental problems and Alcoholism,  Axis V: 41-50 serious symptoms  ADL's:  Intact  Sleep: Fair  Appetite:  Fair  Suicidal Ideation:  Plan:  Denies Intent:  Denies Means:  Denies Homicidal Ideation:  Plan:  Denies Intent:  Denies Means:  Denies AEB (as evidenced by):  Psychiatric Specialty Exam: Physical Exam   Psychiatric: His speech is normal and behavior is normal. Judgment and thought content normal. His mood appears anxious. His affect is not angry, not blunt, not labile and not inappropriate. Cognition and memory are normal. He exhibits a depressed mood.    Review of Systems  Constitutional: Negative.   HENT: Negative.   Eyes: Negative.   Respiratory: Negative.   Cardiovascular: Negative.   Gastrointestinal: Negative.   Genitourinary: Negative.   Musculoskeletal: Negative.   Skin: Negative.   Neurological: Negative.   Endo/Heme/Allergies: Negative.   Psychiatric/Behavioral: Positive for depression and substance abuse (Alcoholism,). Negative for suicidal ideas, hallucinations and memory loss. The patient is nervous/anxious and has insomnia.     Blood pressure 125/76, pulse 71, temperature 98.3 F (36.8 C), temperature source Oral, resp. rate 16, height 6\' 4"  (1.93 m), weight 92.987 kg (205 lb), SpO2 98 %.Body mass index is 24.96 kg/(m^2).  General Appearance: Casual  Eye Contact::  Good  Speech:  Clear and Coherent  Volume:  Normal  Mood:  Anxious and Depressed  Affect:  Congruent  Thought Process:  Coherent and Goal Directed  Orientation:  Full (Time, Place, and Person)  Thought Content:  Rumination  Suicidal Thoughts:  No  Homicidal Thoughts:  No  Memory:  Immediate;   Good Recent;   Good Remote;   Good  Judgement:  Intact  Insight:  Present  Psychomotor Activity:  Normal  Concentration:  Good  Recall:  Good  Fund of Knowledge:Good  Language: Good  Akathisia:  No  Handed:  Right  AIMS (if indicated):     Assets:  Communication Skills Desire for Improvement  Sleep:  Number of Hours: 5.5   Musculoskeletal: Strength & Muscle Tone: within normal limits Gait & Station: normal Patient leans: N/A  Current Medications: Current Facility-Administered Medications  Medication Dose Route Frequency Provider Last Rate Last Dose  . acetaminophen (TYLENOL) tablet 650 mg  650 mg  Oral Q6H PRN Janett Labella, NP   650 mg at 02/03/14 1636  . alum & mag hydroxide-simeth (MAALOX/MYLANTA) 200-200-20 MG/5ML suspension 30 mL  30 mL Oral Q4H PRN Freda Munro May Agustin, NP      . cloNIDine (CATAPRES) tablet 0.1 mg  0.1 mg Oral QID Freda Munro May Agustin, NP   0.1 mg at 02/05/14 1157   Followed by  . [START ON 02/06/2014] cloNIDine (CATAPRES) tablet 0.1 mg  0.1 mg Oral BH-qamhs Sheila May Agustin, NP       Followed by  . [START ON 02/08/2014] cloNIDine (CATAPRES) tablet 0.1 mg  0.1 mg Oral QAC breakfast Freda Munro May Agustin, NP      . dicyclomine (BENTYL) tablet 20 mg  20 mg Oral Q6H PRN Janett Labella, NP      . DULoxetine (CYMBALTA) DR capsule 20 mg  20 mg Oral Daily Kennedy Bucker, NP   20 mg at 02/05/14 0809  . feeding supplement (ENSURE COMPLETE) (ENSURE COMPLETE) liquid 237 mL  237 mL Oral BID BM Darrol Jump, RD   237 mL at 02/04/14 1400  . gabapentin (NEURONTIN) capsule 600 mg  600 mg Oral TID Dara Hoyer, PA-C   600 mg at 02/05/14 1157  . hydrOXYzine (ATARAX/VISTARIL) tablet 25 mg  25 mg Oral Q6H PRN Janett Labella, NP   25 mg at 02/04/14 1610  . lidocaine (LIDODERM) 5 % 1 patch  1 patch Transdermal Daily Kennedy Bucker, NP   1 patch at 02/05/14 0810  . loperamide (IMODIUM) capsule 2-4 mg  2-4 mg Oral PRN Freda Munro May Agustin, NP      . LORazepam (ATIVAN) tablet 0.5 mg  0.5 mg Oral TID Freda Munro May Agustin, NP   0.5 mg at 02/05/14 1157  . losartan (COZAAR) tablet 100 mg  100 mg Oral Daily Dara Hoyer, PA-C   100 mg at 02/05/14 9604  . magnesium hydroxide (MILK OF MAGNESIA) suspension 30 mL  30 mL Oral Daily PRN Freda Munro May Agustin, NP      . meloxicam [facility]) tablet 15 mg  15 mg Oral Daily Dara Hoyer, PA-C   15 mg at 02/05/14 5409  . methocarbamol (ROBAXIN) tablet 500 mg  500 mg Oral Q8H PRN Janett Labella, NP   500 mg at 02/04/14 2129  . metoprolol (LOPRESSOR) tablet 50 mg  50 mg Oral BID Dara Hoyer, PA-C   50 mg at 02/05/14 8119  . naproxen (NAPROSYN)  tablet 500 mg  500 mg Oral BID PRN Janett Labella, NP   500 mg at 02/04/14 2129  . nicotine polacrilex (NICORETTE) gum 2 mg  2 mg Oral PRN Encarnacion Slates, NP   2 mg at 02/05/14 1157  . ondansetron (ZOFRAN-ODT) disintegrating tablet 4 mg  4 mg Oral Q6H PRN Janett Labella, NP      . pantoprazole (PROTONIX) EC tablet 40 mg  40 mg Oral BID Dara Hoyer, PA-C   40 mg at 02/05/14 1478  . QUEtiapine (SEROQUEL) tablet 25 mg  25  mg Oral TID Kennedy Bucker, NP   25 mg at 02/05/14 1157  . traZODone (DESYREL) tablet 100 mg  100 mg Oral QHS PRN,MR X 1 Kennedy Bucker, NP   100 mg at 02/04/14 2210    Lab Results: No results found for this or any previous visit (from the past 48 hour(s)).  Physical Findings: AIMS: Facial and Oral Movements Muscles of Facial Expression: None, normal Lips and Perioral Area: None, normal Jaw: None, normal Tongue: None, normal,Extremity Movements Upper (arms, wrists, hands, fingers): None, normal Lower (legs, knees, ankles, toes): None, normal, Trunk Movements Neck, shoulders, hips: None, normal, Overall Severity Severity of abnormal movements (highest score from questions above): None, normal Incapacitation due to abnormal movements: None, normal Patient's awareness of abnormal movements (rate only patient's report): No Awareness, Dental Status Current problems with teeth and/or dentures?: No Does patient usually wear dentures?: No  CIWA:  CIWA-Ar Total: 8 COWS:  COWS Total Score: 5  Treatment Plan Summary: Daily contact with patient to assess and evaluate symptoms and progress in treatment Medication management  Plan: 1. Continue crisis management, mood stabilization & relapse prevention.. 2. Continue current medication management to reduce current symptoms to base line and improve the  patient's overall level of functioning; Duloxitine 20 mg for depression, increase Seroquel to 50 mg for anxiety 100 mg Q bedtime for mood control Q bedtime for mood control,  increase Neurontin to 800 mg tid for substance withdrawal syndrome/pain. 3.Treat health problems as indicated. 4. Develop treatment plan to enhance medication adeherance upon discharge and the need for  readmission. 5. Psycho-social education regarding relapse prevention and self care.  Medical Decision Making Problem Points:  Established problem, stable/improving (1), Review of last therapy session (1) and Review of psycho-social stressors (1) Data Points:  Review of medication regiment & side effects (2) Review of new medications or change in dosage (2)  I certify that inpatient services furnished can reasonably be expected to improve the patient's condition.   Lindell Spar I, PMHNP-BC 02/05/2014, 1:45 PM  I agreed with the findings, treatment and disposition plan of this patient. Berniece Andreas, MD

## 2014-02-05 NOTE — Progress Notes (Signed)
D   Pt has been to the medication window requesting medications several time  He keeps asking for medication even after he has been to he received all he could have per the doctor orders   He is anxious and irritable but does remain visible on the milieu  A   Verbal support given   Medications administered and effectiveness monitored   Q 15 min checks R   Pt safe at present

## 2014-02-05 NOTE — Progress Notes (Signed)
Pt did attend group this evening.  

## 2014-02-05 NOTE — Progress Notes (Signed)
The patient didn't attend group this evening and was sitting in the hallway when the group ended.

## 2014-02-05 NOTE — [facility] Group Notes (Signed)
0900 nursing orientation group   The focus of this group is to educate the patient on the purpose and policies of crisis stabilization and provide a format to answer questions about their admission.  The group details unit policies and expectations of patients while admitted.  Pt was an active participant in group and was appropriate in his sharing.

## 2014-02-05 NOTE — Progress Notes (Signed)
Pt has been up and active in the milieu today.  He rated both his depression and hopelessness 8 and his anxiety a 9 on his self-inventory. He has not required any prn medications thus far today except for his nicorette gum.  He is hoping to be discharged tomorrow he feels he is ready and his mother feels he is stable enough to return home. He has appointments in place for his pain management and his "wellness" unsure what that appointment entails. If pt does not discharge tomorrow, his mother will bring some papers that pt must have filled out and signed by him. He denies any S/H ideation or A/V/H.

## 2014-02-06 NOTE — [facility] Group Notes (Signed)
[facility] LCSW Group Therapy  02/06/2014   10:00 AM   Type of Therapy:  Group Therapy  Participation Level:  Active  Participation Quality:  Appropriate and Attentive  Affect:  Appropriate, Flat and Depressed  Cognitive:  Alert and Appropriate  Insight:  Developing/Improving and Engaged  Engagement in Therapy:  Developing/Improving and Engaged  Modes of Intervention:  Clarification, Confrontation, Discussion, Education, Exploration, Limit-setting, Orientation, Problem-solving, Rapport Building, Art therapist, Socialization and Support  Summary of Progress/Problems: The main focus of today's process group was to identify the patient's current support system and decide on other supports that can be put in place.  An emphasis was placed on using counselor, doctor, therapy groups, 12-step groups, and problem-specific support groups to expand supports, as well as doing something different than has been done before.  Pt shared that his family has been supportive emotionally and financially but also knows he needs to find support outside of family by seeking support groups.  Pt discussed knowing one has to seek support as it won't come to you.  Pt asked after group about halfway houses, as he doesn't want to be a burden on his mother and may eventually look at moving out.  CSW provided information on [place] [facility], what they are and how pt can look them up.     Regan Lemming, LCSW 02/06/2014 10:36 AM

## 2014-02-06 NOTE — Progress Notes (Signed)
D:  Patient's self inventory sheet, patient slept fair last night, sleep medication was not helpful.  Fair appetite, low energy level, good concentration.  Rated depression and hopeless #7, anxiety #8.  Withdrawals continue, sedation, agitation, nausea, irritable.  Denied SI.  Physical problems lightheaded, pain, dizziness, no energy.  Physical pain in neck, low back, feet/legs, stomach, worst pain #8.  Pain medication is not helpful.   Goal is to discharge home.  Does have discharge plans.  Not leaving until Monday, has MD appointments that he needs to attend. A:  Medications administered per MD orders.  Emotional support and encouragement given patient. R  Denied SI and HI while talking to nurse, contracts for safety.  Denied A/V hallucinations.  Safety maintained with 15 minute checks.

## 2014-02-06 NOTE — Plan of Care (Signed)
Problem: Consults Goal: Anxiety Disorder Patient Education See Patient Education Module for eduction specifics.  Outcome: Completed/Met Date Met:  02/06/14 Nurse discussed anxiety with patient.

## 2014-02-06 NOTE — Progress Notes (Addendum)
Patient ID: Mark [name], male   DOB: 1965-11-22, 48 y.o.   MRN: [id number] [facility] MD Progress Note  02/06/2014 3:33 PM Mark [name]  MRN:  [id number]  Subjective: Mark [name] reports, "I feel great, ready to home in am"  O: Patient seen, chart reviewed. Mark [name] is visible on the unit. He is participating in group sessions. He says he feels he is ready to go home. Says has appointment on both Monday & Tuesday next week at the pain clinic. Says he has good support system.  iagnosis:   DSM5: Schizophrenia Disorders:  NA Obsessive-Compulsive Disorders:  NA Trauma-Stressor Disorders:  NA Substance/Addictive Disorders:  Alcohol Related Disorder - Moderate (303.90) Depressive Disorders:  Major depressive disorder, recurrent, severe without psychotic features  Total Time spent with patient: 30 minutes  Axis I: Alcohol related disorder, Major depressive disorder, recurrent, severe without psychotic features Axis II: Deferred Axis III:  Past Medical History  Diagnosis Date  . Hypertension   . Hyperlipidemia   . 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 ETOH abuse   . GERD (gastroesophageal reflux disease)   . Mental disorder   . Irregular heart beat   . Anxiety   . Shortness of breath   . Headache(784.0)   . Hepatitis   . Withdrawal seizures   . Alcohol withdrawal   . Neuropathy 06/07/2013  . 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 Seizure due to alcohol withdrawal 01/23/2014    Per patient report   Axis IV: other psychosocial or environmental problems and Alcoholism,  Axis V: 41-50 serious symptoms  ADL's:  Intact  Sleep: Fair  Appetite:  Fair  Suicidal Ideation:  Plan:  Denies Intent:  Denies Means:  Denies Homicidal Ideation:  Plan:  Denies Intent:  Denies Means:  Denies AEB (as evidenced by):  Psychiatric Specialty Exam: Physical Exam  Psychiatric: His speech is normal and behavior is normal. Judgment and thought content normal. His mood  appears anxious. His affect is not angry, not blunt, not labile and not inappropriate. Cognition and memory are normal. He exhibits a depressed mood.    Review of Systems  Constitutional: Negative.   HENT: Negative.   Eyes: Negative.   Respiratory: Negative.   Cardiovascular: Negative.   Gastrointestinal: Negative.   Genitourinary: Negative.   Musculoskeletal: Negative.   Skin: Negative.   Neurological: Negative.   Endo/Heme/Allergies: Negative.   Psychiatric/Behavioral: Positive for depression and substance abuse (Alcoholism,). Negative for suicidal ideas, hallucinations and memory loss. The patient is nervous/anxious and has insomnia.     Blood pressure 134/91, pulse 88, temperature 97.5 F (36.4 C), temperature source Oral, resp. rate 18, height 6\' 4"  (1.93 m), weight 92.987 kg (205 lb), SpO2 98 %.Body mass index is 24.96 kg/(m^2).  General Appearance: Casual  Eye Contact::  Good  Speech:  Clear and Coherent  Volume:  Normal  Mood:  Anxious and Depressed  Affect:  Congruent  Thought Process:  Coherent and Goal Directed  Orientation:  Full (Time, Place, and Person)  Thought Content:  Rumination  Suicidal Thoughts:  No  Homicidal Thoughts:  No  Memory:  Immediate;   Good Recent;   Good Remote;   Good  Judgement:  Intact  Insight:  Present  Psychomotor Activity:  Normal  Concentration:  Good  Recall:  Good  Fund of Knowledge:Good  Language: Good  Akathisia:  No  Handed:  Right  AIMS (if indicated):     Assets:  Communication Skills Desire for Improvement  Sleep:  Number of Hours: 6.25   Musculoskeletal: Strength & Muscle Tone: within normal limits Gait & Station: normal Patient leans: N/A  Current Medications: Current Facility-Administered Medications  Medication Dose Route Frequency Provider Last Rate Last Dose  . acetaminophen (TYLENOL) tablet 650 mg  650 mg Oral Q6H PRN Janett Labella, NP   650 mg at 02/03/14 1636  . alum & mag hydroxide-simeth  (MAALOX/MYLANTA) 200-200-20 MG/5ML suspension 30 mL  30 mL Oral Q4H PRN Janett Labella, NP   30 mL at 02/06/14 0808  . cloNIDine (CATAPRES) tablet 0.1 mg  0.1 mg Oral BH-qamhs Sheila May Agustin, NP   0.1 mg at 02/06/14 0800   Followed by  . [START ON 02/08/2014] cloNIDine (CATAPRES) tablet 0.1 mg  0.1 mg Oral QAC breakfast Freda Munro May Agustin, NP      . dicyclomine (BENTYL) tablet 20 mg  20 mg Oral Q6H PRN Janett Labella, NP      . DULoxetine (CYMBALTA) DR capsule 20 mg  20 mg Oral Daily Kennedy Bucker, NP   20 mg at 02/06/14 0801  . feeding supplement (ENSURE COMPLETE) (ENSURE COMPLETE) liquid 237 mL  237 mL Oral BID BM Darrol Jump, RD   237 mL at 02/06/14 1457  . gabapentin (NEURONTIN) capsule 600 mg  600 mg Oral TID Dara Hoyer, PA-C   600 mg at 02/06/14 1200  . hydrOXYzine (ATARAX/VISTARIL) tablet 25 mg  25 mg Oral Q6H PRN Janett Labella, NP   25 mg at 02/06/14 1418  . lidocaine (LIDODERM) 5 % 1 patch  1 patch Transdermal Daily Kennedy Bucker, NP   1 patch at 02/06/14 0803  . loperamide (IMODIUM) capsule 2-4 mg  2-4 mg Oral PRN Freda Munro May Agustin, NP      . LORazepam (ATIVAN) tablet 0.5 mg  0.5 mg Oral TID Freda Munro May Agustin, NP   0.5 mg at 02/06/14 1200  . losartan (COZAAR) tablet 100 mg  100 mg Oral Daily Dara Hoyer, PA-C   100 mg at 02/06/14 3818  . magnesium hydroxide (MILK OF MAGNESIA) suspension 30 mL  30 mL Oral Daily PRN Freda Munro May Agustin, NP      . meloxicam [facility]) tablet 15 mg  15 mg Oral Daily Dara Hoyer, PA-C   15 mg at 02/06/14 0804  . methocarbamol (ROBAXIN) tablet 500 mg  500 mg Oral Q8H PRN Janett Labella, NP   500 mg at 02/06/14 1417  . metoprolol (LOPRESSOR) tablet 50 mg  50 mg Oral BID Dara Hoyer, PA-C   50 mg at 02/06/14 0805  . naproxen (NAPROSYN) tablet 500 mg  500 mg Oral BID PRN Janett Labella, NP   500 mg at 02/06/14 1418  . nicotine polacrilex (NICORETTE) gum 2 mg  2 mg Oral PRN Encarnacion Slates, NP   2 mg at 02/06/14 1420  .  ondansetron (ZOFRAN-ODT) disintegrating tablet 4 mg  4 mg Oral Q6H PRN Janett Labella, NP      . pantoprazole (PROTONIX) EC tablet 40 mg  40 mg Oral BID Dara Hoyer, PA-C   40 mg at 02/06/14 0857  . QUEtiapine (SEROQUEL) tablet 100 mg  100 mg Oral QHS Encarnacion Slates, NP   100 mg at 02/05/14 2200  . QUEtiapine (SEROQUEL) tablet 50 mg  50 mg Oral TID Encarnacion Slates, NP   50 mg at 02/06/14 1201  . traZODone (DESYREL) tablet 100 mg  100 mg Oral QHS PRN,MR X 1 Kennedy Bucker, NP   100 mg at 02/05/14 2200    Lab Results: No results found for this or any previous visit (from the past 48 hour(s)).  Physical Findings: AIMS: Facial and Oral Movements Muscles of Facial Expression: None, normal Lips and Perioral Area: None, normal Jaw: None, normal Tongue: None, normal,Extremity Movements Upper (arms, wrists, hands, fingers): None, normal Lower (legs, knees, ankles, toes): None, normal, Trunk Movements Neck, shoulders, hips: None, normal, Overall Severity Severity of abnormal movements (highest score from questions above): None, normal Incapacitation due to abnormal movements: None, normal Patient's awareness of abnormal movements (rate only patient's report): No Awareness, Dental Status Current problems with teeth and/or dentures?: No Does patient usually wear dentures?: No  CIWA:  CIWA-Ar Total: 1 COWS:  COWS Total Score: 2  Treatment Plan Summary: Daily contact with patient to assess and evaluate symptoms and progress in treatment Medication management  Plan: 1. Continue crisis management, mood stabilization & relapse prevention.. 2. Continue current medication management to reduce current symptoms to base line and improve the  patient's overall level of functioning; Duloxitine 20 mg for depression, Continue Seroquel to 50 mg for anxiety 100 mg Q bedtime for mood control, Neurontin to 800 mg tid for substance withdrawal syndrome/pain. 3.Treat health problems as indicated. 4. Develop  treatment plan to enhance medication adeherance upon discharge and the need for  readmission. 5. Psycho-social education regarding relapse prevention and self care. 6. Possible D/C in am  Medical Decision Making Problem Points:  Established problem, stable/improving (1), Review of last therapy session (1) and Review of psycho-social stressors (1) Data Points:  Review of medication regiment & side effects (2) Review of new medications or change in dosage (2)  I certify that inpatient services furnished can reasonably be expected to improve the patient's condition.   Lindell Spar I, PMHNP-BC 02/06/2014, 3:33 PM  I agreed with the findings, treatment and disposition plan of this patient. Berniece Andreas, MD

## 2014-02-06 NOTE — [facility] Group Notes (Signed)
The focus of this group is to educate the patient on the purpose and policies of crisis stabilization and provide a format to answer questions about their admission.  The group details unit policies and expectations of patients while admitted.  Patient attended 0900 nurse education orientation group this morning.  Patient actively participated, appropriate affect, alert, appropriate insight and engagement.  Today patient will work on 3 goals for discharge.  Patient will be optimistic today to overcome his chronic pain.

## 2014-02-06 NOTE — Progress Notes (Addendum)
D: Pt denies SI/HI/AVH. Pt is pleasant and cooperative. Pt hal limited insight into Tx. Pt appears closed minded with his thought process. every time writer suggested a solution pt found something wrong with it, or stated he tried everything, but never heard of most of the things writer spoke about.   A: Pt was offered support and encouragement. Pt was given scheduled medications. Pt was encourage to attend groups. Q 15 minute checks were done for safety.   R:Pt attends groups and interacts well with peers and staff. Pt is taking medication.Pt receptive to treatment and safety maintained on unit.

## 2014-02-07 DIAGNOSIS — F1020 Alcohol dependence, uncomplicated: Secondary | ICD-10-CM

## 2014-02-07 DIAGNOSIS — F1120 Opioid dependence, uncomplicated: Principal | ICD-10-CM

## 2014-02-07 MED ORDER — LIDOCAINE 5 % EX PTCH: 1.0000 | MEDICATED_PATCH | CUTANEOUS | Status: DC

## 2014-02-07 MED ORDER — METOPROLOL TARTRATE 50 MG PO TABS: 50.0000 mg | ORAL_TABLET | Freq: Two times a day (BID) | ORAL | Status: DC

## 2014-02-07 MED ORDER — GABAPENTIN 800 MG PO TABS
800.0000 mg | ORAL_TABLET | Freq: Three times a day (TID) | ORAL | Status: DC
  Filled 2014-02-07: qty 42

## 2014-02-07 MED ORDER — DULOXETINE HCL 30 MG PO CPEP: 30.0000 mg | ORAL_CAPSULE | Freq: Every day | ORAL | Status: DC

## 2014-02-07 MED ORDER — PANTOPRAZOLE SODIUM 40 MG PO TBEC: 40.0000 mg | DELAYED_RELEASE_TABLET | Freq: Two times a day (BID) | ORAL | Status: DC

## 2014-02-07 MED ORDER — TRAZODONE HCL 100 MG PO TABS: 100.0000 mg | ORAL_TABLET | Freq: Every evening | ORAL | Status: DC | PRN

## 2014-02-07 MED ORDER — LORAZEPAM 0.5 MG PO TABS: 0.5000 mg | ORAL_TABLET | Freq: Three times a day (TID) | ORAL | Status: DC

## 2014-02-07 MED ORDER — MELOXICAM 15 MG PO TABS: 15.0000 mg | ORAL_TABLET | Freq: Every day | ORAL | Status: DC

## 2014-02-07 MED ORDER — GABAPENTIN 400 MG PO CAPS: 800.0000 mg | ORAL_CAPSULE | Freq: Three times a day (TID) | ORAL | Status: DC

## 2014-02-07 MED ORDER — QUETIAPINE FUMARATE 50 MG PO TABS: 50.0000 mg | ORAL_TABLET | Freq: Three times a day (TID) | ORAL | Status: DC

## 2014-02-07 MED ORDER — HYDROXYZINE HCL 25 MG PO TABS: 25.0000 mg | ORAL_TABLET | Freq: Four times a day (QID) | ORAL | Status: DC | PRN

## 2014-02-07 MED ORDER — ALBUTEROL SULFATE HFA 108 (90 BASE) MCG/ACT IN AERS: 2.0000 | INHALATION_SPRAY | RESPIRATORY_TRACT | Status: DC | PRN

## 2014-02-07 MED ORDER — DULOXETINE HCL 30 MG PO CPEP
30.0000 mg | ORAL_CAPSULE | Freq: Every day | ORAL | Status: DC
  Filled 2014-02-07: qty 14
  Filled 2014-02-07: qty 1

## 2014-02-07 MED ORDER — GABAPENTIN 400 MG PO CAPS
800.0000 mg | ORAL_CAPSULE | Freq: Three times a day (TID) | ORAL | Status: DC
Start: 2014-02-07 — End: 2014-02-07
  Administered 2014-02-07: 800 mg via ORAL
  Filled 2014-02-07 (×7): qty 2

## 2014-02-07 MED ORDER — LOSARTAN POTASSIUM 100 MG PO TABS: 100.0000 mg | ORAL_TABLET | Freq: Every day | ORAL | Status: DC

## 2014-02-07 MED ORDER — NICOTINE 14 MG/24HR TD PT24: 14.0000 mg | MEDICATED_PATCH | Freq: Every day | TRANSDERMAL | Status: DC

## 2014-02-07 NOTE — Progress Notes (Signed)
Patient ID: Mark [name], male   DOB: 1965-12-02, 48 y.o.   MRN: [id number] D: Patient appears sad and depressed. Pt c/o chronic pain with no pain medications helping. Pt rated pain as 7 on a 0-10 scale.  Pt. reports he has not been sleeping well because of the pain. Denies SI/HI/AV and contract for safety at this time. Offered no additional question or concerns  A: Safety has been maintained with Q15 minutes observation. Supported and encouragement provided.  R: Patient remains safe. He is complaint with medication and group programming.

## 2014-02-07 NOTE — Progress Notes (Signed)
Patient ID: Mark [name], male   DOB: [birth year], 48 y.o.   MRN: [id number] HE HAS BEEN DISCHARGED HOME AND WAS PICKED UP BY HIS STEP FATHER. PT.   VOICED UNDERSTANDING OF DISCHARGE INSTRUCTION AND OF FOLLOW UP TEACHING ABOUT MEDICATIONS AND FOLLOW UP CREW. HE DENIES THOUGHTS OFsii AND ALL HIS BELONGINGS WERE TAKEN HOME WITH HIM.

## 2014-02-07 NOTE — Progress Notes (Signed)
Patient ID: Mark [name], male   DOB: September 26, 1965, 48 y.o.   MRN: [id number] He has been up and about interacting with peers and staff.  Requested and received prn naproxen and robaxin at 08:25 this AM that was helpful. Self inventory: depression 3, hopeless 3, anxiety 5, denies Si thoughts , c/o pain Prn given. Goal is to stay positive.

## 2014-02-07 NOTE — Progress Notes (Signed)
[facility] Adult Case Management Discharge Plan :  Will you be returning to the same living situation after discharge: Yes,  patient plans to return home with family At discharge, do you have transportation home?:Yes,  patient reports access to transportation Do you have the ability to pay for your medications:Yes,  patient will be provided with medication samples at discharge  Release of information consent forms completed and in the chart;  Patient's signature needed at discharge.  Patient to Follow up at: Follow-up Information    Follow up with [facility] On 02/07/2014.   Specialty:  Professional Counselor   Why:  Please follow up with [facility] on this day or anyday Monday-Friday 8am to 12 pm and 1 to 2:30 pm regarding medication management and therapy appointments at discharge.   Contact information:   [facility] [place] [address] [phone]       Patient denies SI/HI:   Yes,  denies    Safety Planning and Suicide Prevention discussed:  Yes,  with patient  Mark [name], Mark Needle 02/07/2014, 11:43 AM
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
## 2014-02-07 NOTE — BHH Group Notes (Signed)
   [facility] LCSW Aftercare Discharge Planning Group Note  02/07/2014  8:45 AM   Participation Quality: Alert, Appropriate and Oriented  Mood/Affect: Appropriate  Depression Rating: 3/4  Anxiety Rating: 3/4  Thoughts of Suicide: Pt denies SI/HI  Will you contract for safety? Yes  Current AVH: Pt denies  Plan for Discharge/Comments: Pt attended discharge planning group and actively participated in group. CSW provided pt with today's workbook. Patient reports feeling "pretty good" today and ready to discharge. Patient plans to follow up with [facility], as well as [facility], and a pain management clinic at discharge. He plans to return home with his mother and step-father.  Transportation Means: Pt reports access to transportation  Supports: No supports mentioned at this time  Tilden Fossa, MSW, [place] Social Worker [employer] [phone]

## 2014-02-07 NOTE — BHH Suicide Risk Assessment (Signed)
Suicide Risk Assessment  Discharge Assessment     Demographic Factors:  Male and Caucasian  Total Time spent with patient: 30 minutes  Psychiatric Specialty Exam:     Blood pressure 128/76, pulse 75, temperature 98.3 F (36.8 C), temperature source Oral, resp. rate 18, height 6\' 4"  (1.93 m), weight 92.987 kg (205 lb), SpO2 98 %.Body mass index is 24.96 kg/(m^2).  General Appearance: Fairly Groomed  Engineer, water::  Fair  Speech:  Clear and Coherent  Volume:  Normal  Mood:  Euthymic  Affect:  Appropriate  Thought Process:  Coherent and Goal Directed  Orientation:  Full (Time, Place, and Person)  Thought Content:  Plans as he moves on, relapse prevention plan  Suicidal Thoughts:  No  Homicidal Thoughts:  No  Memory:  Immediate;   Fair Recent;   Fair Remote;   Fair  Judgement:  Fair  Insight:  Present  Psychomotor Activity:  Normal  Concentration:  Fair  Recall:  [employer] of Knowledge:NA  Language: Fair  Akathisia:  No  Handed:    AIMS (if indicated):     Assets:  Desire for Improvement Housing Social Support  Sleep:  Number of Hours: 6.25    Musculoskeletal: Strength & Muscle Tone: within normal limits Gait & Station: normal Patient leans: N/A   Mental Status Per Nursing Assessment::   On Admission:  NA  Current Mental Status by Physician: In full contact with reality. There are no active S/S of withdrawal. He is going to pursue treatment at a pain clinic. States his drinking came about trying to "medicate" his pain. Endorses no active SI plans or intent   Loss Factors: Decline in physical health  Historical Factors: NA  Risk Reduction Factors:   Sense of responsibility to family, Living with another person, especially a relative and Positive social support  Continued Clinical Symptoms:  Depression:   Comorbid alcohol abuse/dependence Alcohol/Substance Abuse/Dependencies  Cognitive Features That Contribute To Risk:  Polarized thinking Thought  constriction (tunnel vision)    Suicide Risk:  Minimal: No identifiable suicidal ideation.  Patients presenting with no risk factors but with morbid ruminations; may be classified as minimal risk based on the severity of the depressive symptoms  Discharge Diagnoses:   AXIS I:  Alcohol Dependence, Major Depression recurrent, GAD AXIS II:  No diagnosis AXIS III:   Past Medical History  Diagnosis Date  . Hypertension   . Hyperlipidemia   . 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 ETOH abuse   . GERD (gastroesophageal reflux disease)   . Mental disorder   . Irregular heart beat   . Anxiety   . Shortness of breath   . Headache(784.0)   . Hepatitis   . Withdrawal seizures   . Alcohol withdrawal   . Neuropathy 06/07/2013  . 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 Seizure due to alcohol withdrawal 01/23/2014    Per patient report   AXIS IV:  other psychosocial or environmental problems AXIS V:  61-70 mild symptoms  Plan Of Care/Follow-up recommendations:  Activity:  as tolerated Diet:  regular Follow up outpatient basis Is patient on multiple antipsychotic therapies at discharge:  No   Has Patient had three or more failed trials of antipsychotic monotherapy by history:  No  Recommended Plan for Multiple Antipsychotic Therapies: NA    Makyla Bye A 02/07/2014, 1:17 PM

## 2014-02-07 NOTE — Plan of Care (Signed)
Problem: Alteration in mood; excessive anxiety as evidenced by: Goal: STG-Pt will report an absence of self-harm thoughts/actions (Patient will report an absence of self-harm thoughts or actions)  Outcome: Progressing Pt is safe and denies SI this shift.

## 2014-02-08 ENCOUNTER — Ambulatory Visit: Payer: MEDICAID | Attending: Internal Medicine | Admitting: Internal Medicine

## 2014-02-08 ENCOUNTER — Encounter: Payer: Self-pay | Admitting: Internal Medicine

## 2014-02-08 VITALS — BP 153/100 | HR 83 | Temp 98.2°F | Resp 16 | Ht 76.5 in | Wt 217.8 lb

## 2014-02-08 DIAGNOSIS — F329 Major depressive disorder, single episode, unspecified: Secondary | ICD-10-CM

## 2014-02-08 DIAGNOSIS — G629 Polyneuropathy, unspecified: Secondary | ICD-10-CM

## 2014-02-08 DIAGNOSIS — G894 Chronic pain syndrome: Secondary | ICD-10-CM

## 2014-02-08 DIAGNOSIS — K219 Gastro-esophageal reflux disease without esophagitis: Secondary | ICD-10-CM

## 2014-02-08 DIAGNOSIS — F319 Bipolar disorder, unspecified: Secondary | ICD-10-CM | POA: Insufficient documentation

## 2014-02-08 DIAGNOSIS — F32A Depression, unspecified: Secondary | ICD-10-CM

## 2014-02-08 DIAGNOSIS — F4321 Adjustment disorder with depressed mood: Secondary | ICD-10-CM | POA: Insufficient documentation

## 2014-02-08 DIAGNOSIS — I10 Essential (primary) hypertension: Secondary | ICD-10-CM | POA: Insufficient documentation

## 2014-02-08 DIAGNOSIS — K029 Dental caries, unspecified: Secondary | ICD-10-CM

## 2014-02-08 MED ORDER — QUETIAPINE FUMARATE 50 MG PO TABS: 50.0000 mg | ORAL_TABLET | Freq: Three times a day (TID) | ORAL | Status: DC

## 2014-02-08 MED ORDER — GABAPENTIN 400 MG PO CAPS: 800.0000 mg | ORAL_CAPSULE | Freq: Three times a day (TID) | ORAL | Status: DC

## 2014-02-08 MED ORDER — ESOMEPRAZOLE MAGNESIUM 40 MG PO CPDR: 40.0000 mg | DELAYED_RELEASE_CAPSULE | Freq: Every day | ORAL | Status: DC

## 2014-02-08 MED ORDER — LOSARTAN POTASSIUM 100 MG PO TABS: 100.0000 mg | ORAL_TABLET | Freq: Every day | ORAL | Status: DC

## 2014-02-08 MED ORDER — METOPROLOL TARTRATE 50 MG PO TABS: 50.0000 mg | ORAL_TABLET | Freq: Two times a day (BID) | ORAL | Status: DC

## 2014-02-08 MED ORDER — DULOXETINE HCL 60 MG PO CPEP: 60.0000 mg | ORAL_CAPSULE | Freq: Every day | ORAL | Status: DC

## 2014-02-08 MED ORDER — TRAZODONE HCL 100 MG PO TABS: 100.0000 mg | ORAL_TABLET | Freq: Every evening | ORAL | Status: DC | PRN

## 2014-02-08 MED ORDER — DICYCLOMINE HCL 10 MG PO CAPS: 10.0000 mg | ORAL_CAPSULE | Freq: Three times a day (TID) | ORAL | Status: DC

## 2014-02-08 MED ORDER — HYDROXYZINE HCL 25 MG PO TABS: 25.0000 mg | ORAL_TABLET | Freq: Four times a day (QID) | ORAL | Status: DC | PRN

## 2014-02-08 MED ORDER — LORAZEPAM 0.5 MG PO TABS: 0.5000 mg | ORAL_TABLET | Freq: Three times a day (TID) | ORAL | Status: DC | PRN

## 2014-02-08 MED ORDER — ACETAMINOPHEN-CODEINE #3 300-30 MG PO TABS: 1.0000 | ORAL_TABLET | Freq: Three times a day (TID) | ORAL | Status: DC | PRN

## 2014-02-08 NOTE — Progress Notes (Signed)
Patient is here for a follow up visit. He c/o abdominal bloating that occurs after he eats.  He states his pain is a 5 today.  C/o abscess on gums that comes and goes.

## 2014-02-08 NOTE — Patient Instructions (Signed)
Generalized Anxiety Disorder Generalized anxiety disorder (GAD) is a mental disorder. It interferes with life functions, including relationships, work, and school. GAD is different from normal anxiety, which everyone experiences at some point in their lives in response to specific life events and activities. Normal anxiety actually helps [place] prepare for and get through these life events and activities. Normal anxiety goes away after the event or activity is over.  GAD causes anxiety that is not necessarily related to specific events or activities. It also causes excess anxiety in proportion to specific events or activities. The anxiety associated with GAD is also difficult to control. GAD can vary from mild to severe. People with severe GAD can have intense waves of anxiety with physical symptoms (panic attacks).  SYMPTOMS The anxiety and worry associated with GAD are difficult to control. This anxiety and worry are related to many life events and activities and also occur more days than not for 6 months or longer. People with GAD also have three or more of the following symptoms (one or more in children):  Restlessness.   Fatigue.  Difficulty concentrating.   Irritability.  Muscle tension.  Difficulty sleeping or unsatisfying sleep. DIAGNOSIS GAD is diagnosed through an assessment by your health care provider. Your health care provider will ask you questions aboutyour mood,physical symptoms, and events in your life. Your health care provider may ask you about your medical history and use of alcohol or drugs, including prescription medicines. Your health care provider may also do a physical exam and blood tests. Certain medical conditions and the use of certain substances can cause symptoms similar to those associated with GAD. Your health care provider may refer you to a mental health specialist for further evaluation. TREATMENT The following therapies are usually used to treat GAD:    Medication. Antidepressant medication usually is prescribed for long-term daily control. Antianxiety medicines may be added in severe cases, especially when panic attacks occur.   Talk therapy (psychotherapy). Certain types of talk therapy can be helpful in treating GAD by providing support, education, and guidance. A form of talk therapy called cognitive behavioral therapy can teach you healthy ways to think about and react to daily life events and activities.  Stress managementtechniques. These include yoga, meditation, and exercise and can be very helpful when they are practiced regularly. A mental health specialist can help determine which treatment is best for you. Some people see improvement with one therapy. However, other people require a combination of therapies. Document Released: 06/22/2012 Document Revised: 07/12/2013 Document Reviewed: 06/22/2012 [facility] Patient Information 2015 [place], [place]. This information is not intended to replace advice given to you by your health care provider. Make sure you discuss any questions you have with your health care provider. Chronic Back Pain  When back pain lasts longer than 3 months, it is called chronic back pain.People with chronic back pain often go through certain periods that are more intense (flare-ups).  CAUSES Chronic back pain can be caused by wear and tear (degeneration) on different structures in your back. These structures include:  The bones of your spine (vertebrae) and the joints surrounding your spinal cord and nerve roots (facets).  The strong, fibrous tissues that connect your vertebrae (ligaments). Degeneration of these structures may result in pressure on your nerves. This can lead to constant pain. HOME CARE INSTRUCTIONS  Avoid bending, heavy lifting, prolonged sitting, and activities which make the problem worse.  Take brief periods of rest throughout the day to reduce your pain. Lying  down or standing usually is  better than sitting while you are resting.  Take over-the-counter or prescription medicines only as directed by your caregiver. SEEK IMMEDIATE MEDICAL CARE IF:   You have weakness or numbness in one of your legs or feet.  You have trouble controlling your bladder or bowels.  You have nausea, vomiting, abdominal pain, shortness of breath, or fainting. Document Released: 04/04/2004 Document Revised: 05/20/2011 Document Reviewed: 02/09/2011 [facility] Patient Information 2015 [place], [place]. This information is not intended to replace advice given to you by your health care provider. Make sure you discuss any questions you have with your health care provider.

## 2014-02-08 NOTE — Progress Notes (Signed)
Patient ID: ORMOND LAZO, male   DOB: 1965-12-16, 48 y.o.   MRN: [id number]   Yazir Koerber, is a 48 y.o. male  ZMO:[id number]  [id number]  DOB - 02-08-1966  Chief Complaint  Patient presents with  . Follow-up        Subjective:   Mervin Ramires is a 48 y.o. male here today for a follow up visit. Patient has history of hypertension, hyperlipidemia, drug of abuse, alcohol abuse, GERD, previous alcohol withdrawal seizure, major depression and anxiety with recent suicidal attempt, here today for routine follow-up. He c/o abdominal bloating that occurs after he eats. He states his pain is a 5/10 today.He also C/o abscess on gums that comes and goes wondering if he could be referred to the dentist.Patient has No headache, No chest pain, No abdominal pain - No Nausea, No new weakness tingling or numbness, No Cough - SOB.  Problem  Depression  Neuropathy  Adjustment Disorder With Depressed Mood  Essential Hypertension    ALLERGIES: Allergies  Allergen Reactions  . Lisinopril Anaphylaxis and Swelling    Per Dr. Terrence Dupont (12/20/13), patient can tolerate losartan.    PAST MEDICAL HISTORY: Past Medical History  Diagnosis Date  . Hypertension   . Hyperlipidemia   . 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 ETOH abuse   . GERD (gastroesophageal reflux disease)   . Mental disorder   . Irregular heart beat   . Anxiety   . Shortness of breath   . Headache(784.0)   . Hepatitis   . Withdrawal seizures   . Alcohol withdrawal   . Neuropathy 06/07/2013  . 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 Seizure due to alcohol withdrawal 01/23/2014    Per patient report    MEDICATIONS AT HOME: Prior to Admission medications   Medication Sig Start Date End Date Taking? Authorizing Provider  albuterol (PROVENTIL HFA;VENTOLIN HFA) 108 (90 BASE) MCG/ACT inhaler Inhale 2 puffs into the lungs every 4 (four) hours as needed for wheezing or shortness of breath.  02/07/14  Yes Encarnacion Slates, NP  cyclobenzaprine (FLEXERIL) 10 MG tablet Take 10 mg by mouth 3 (three) times daily as needed for muscle spasms.   Yes Historical Provider, MD  DULoxetine (CYMBALTA) 60 MG capsule Take 1 capsule (60 mg total) by mouth daily. For depression 02/08/14  Yes Clayvon Parlett E Doreene Burke, MD  gabapentin (NEURONTIN) 400 MG capsule Take 2 capsules (800 mg total) by mouth 3 (three) times daily. For substance withdrawal syndrome/paina management 02/08/14  Yes Tresa Garter, MD  hydrOXYzine (ATARAX/VISTARIL) 25 MG tablet Take 1 tablet (25 mg total) by mouth every 6 (six) hours as needed for anxiety. 02/08/14  Yes Yang Rack E Doreene Burke, MD  lidocaine (LIDODERM) 5 % Place 1 patch onto the skin daily. Remove & Discard patch within 12 hours or as directed by MD: For pain management 02/07/14  Yes Encarnacion Slates, NP  LORazepam (ATIVAN) 0.5 MG tablet Take 1 tablet (0.5 mg total) by mouth every 8 (eight) hours as needed for anxiety. For anxiety 02/08/14  Yes Symphony Demuro E Doreene Burke, MD  losartan (COZAAR) 100 MG tablet Take 1 tablet (100 mg total) by mouth daily. For high blood pressure 02/08/14  Yes Keeley Sussman E Doreene Burke, MD  meloxicam (MOBIC) 15 MG tablet Take 1 tablet (15 mg total) by mouth daily. For arthritic pain 02/07/14  Yes Encarnacion Slates, NP  metoprolol (LOPRESSOR) 50 MG tablet Take 1 tablet (50 mg total) by  mouth 2 (two) times daily. For high blood pressure 02/08/14  Yes Kailee Essman E Doreene Burke, MD  QUEtiapine (SEROQUEL) 50 MG tablet Take 1 tablet (50 mg total) by mouth 3 (three) times daily. Take 1 tablet (50 mg) three times daily & 2 tablets (100 mg) at bedtime for agitation/mood control 02/08/14  Yes Tresa Garter, MD  traZODone (DESYREL) 100 MG tablet Take 1 tablet (100 mg total) by mouth at bedtime as needed and may repeat dose one time if needed for sleep. 02/08/14  Yes Tresa Garter, MD  acetaminophen-codeine (TYLENOL #3) 300-30 MG per tablet Take 1 tablet by mouth every 8 (eight) hours  as needed. 02/08/14   Tresa Garter, MD  dicyclomine (BENTYL) 10 MG capsule Take 1 capsule (10 mg total) by mouth 4 (four) times daily -  before meals and at bedtime. 02/08/14   Tresa Garter, MD  esomeprazole (NEXIUM) 40 MG capsule Take 1 capsule (40 mg total) by mouth daily at 12 noon. 02/08/14   Tresa Garter, MD  nicotine (NICODERM CQ - DOSED IN MG/24 HOURS) 14 mg/24hr patch Place 1 patch (14 mg total) onto the skin daily. For nicotine addiction 02/07/14   Encarnacion Slates, NP     Objective:   Filed Vitals:   02/08/14 1159  BP: 153/100  Pulse: 83  Temp: 98.2 F (36.8 C)  TempSrc: Oral  Resp: 16  Height: 6' 4.5" (1.943 m)  Weight: 217 lb 12.8 oz (98.793 kg)  SpO2: 99%    Exam General appearance : Awake, alert, not in any distress. Speech Clear. Not toxic looking HEENT: Atraumatic and Normocephalic, pupils equally reactive to light and accomodation Neck: supple, no JVD. No cervical lymphadenopathy.  Chest:Good air entry bilaterally, no added sounds  CVS: S1 S2 regular, no murmurs.  Abdomen: Bowel sounds present, Non tender and not distended with no gaurding, rigidity or rebound. Extremities: B/L Lower Ext shows no edema, both legs are warm to touch Neurology: Awake alert, and oriented X 3, CN II-XII intact, Non focal Skin:No Rash Wounds:N/A  Data Review Lab Results  Component Value Date   HGBA1C 6.1* 01/22/2014     Assessment & Plan   1. Chronic pain syndrome  - acetaminophen-codeine (TYLENOL #3) 300-30 MG per tablet; Take 1 tablet by mouth every 8 (eight) hours as needed.  Dispense: 30 tablet; Refill: 0  2. Depression Refill - QUEtiapine (SEROQUEL) 50 MG tablet; Take 1 tablet (50 mg total) by mouth 3 (three) times daily. Take 1 tablet (50 mg) three times daily & 2 tablets (100 mg) at bedtime for agitation/mood control  Dispense: 180 tablet; Refill: 3 - traZODone (DESYREL) 100 MG tablet; Take 1 tablet (100 mg total) by mouth at bedtime as needed and may  repeat dose one time if needed for sleep.  Dispense: 90 tablet; Refill: 3 - DULoxetine (CYMBALTA) 60 MG capsule; Take 1 capsule (60 mg total) by mouth daily. For depression  Dispense: 30 capsule; Refill: 3  3. Neuropathy Refill - gabapentin (NEURONTIN) 400 MG capsule; Take 2 capsules (800 mg total) by mouth 3 (three) times daily. For substance withdrawal syndrome/paina management  Dispense: 180 capsule; Refill: 3  4. Adjustment disorder with depressed mood Refill - hydrOXYzine (ATARAX/VISTARIL) 25 MG tablet; Take 1 tablet (25 mg total) by mouth every 6 (six) hours as needed for anxiety.  Dispense: 90 tablet; Refill: 3 - LORazepam (ATIVAN) 0.5 MG tablet; Take 1 tablet (0.5 mg total) by mouth every 8 (eight) hours as needed for  anxiety. For anxiety  Dispense: 30 tablet; Refill: 0  5. Essential hypertension Refill - losartan (COZAAR) 100 MG tablet; Take 1 tablet (100 mg total) by mouth daily. For high blood pressure  Dispense: 90 tablet; Refill: 3 - metoprolol (LOPRESSOR) 50 MG tablet; Take 1 tablet (50 mg total) by mouth 2 (two) times daily. For high blood pressure  Dispense: 180 tablet; Refill: 3  6. Gastroesophageal reflux disease without esophagitis  - dicyclomine (BENTYL) 10 MG capsule; Take 1 capsule (10 mg total) by mouth 4 (four) times daily -  before meals and at bedtime.  Dispense: 30 capsule; Refill: 0 - esomeprazole (NEXIUM) 40 MG capsule; Take 1 capsule (40 mg total) by mouth daily at 12 noon.  Dispense: 30 capsule; Refill: 3   Patient was extensively counseled about nutrition and exercise   Return in about 3 months (around 05/10/2014) for Follow up HTN, Follow up Pain and comorbidities.  The patient was given clear instructions to go to ER or return to medical center if symptoms don't improve, worsen or new problems develop. The patient verbalized understanding. The patient was told to call to get lab results if they haven't heard anything in the next week.   This note has been  created with Surveyor, quantity. Any transcriptional errors are unintentional.    Angelica Chessman, MD, [place], [place], [place] and [place], [place]   02/08/2014, 1:00 PM

## 2014-02-09 NOTE — Progress Notes (Signed)
Patient Discharge Instructions:  After Visit Summary (AVS):   Faxed to:  02/09/14 Discharge Summary Note:   Faxed to:  02/09/14 Psychiatric Admission Assessment Note:   Faxed to:  02/09/14 Suicide Risk Assessment - Discharge Assessment:   Faxed to:  02/09/14 Faxed/Sent to the Next Level Care provider:  02/09/14 Faxed to [facility] @ [place], 02/09/2014, 2:20 PM

## 2014-02-17 ENCOUNTER — Encounter (HOSPITAL_COMMUNITY): Payer: Self-pay | Admitting: Cardiology

## 2014-02-28 ENCOUNTER — Other Ambulatory Visit: Payer: Self-pay

## 2014-02-28 ENCOUNTER — Ambulatory Visit (HOSPITAL_COMMUNITY)
Admission: RE | Admit: 2014-02-28 | Discharge: 2014-02-28 | Disposition: A | Discharge status: Home / Self Care | Payer: Self-pay | Source: Ambulatory Visit | Attending: Internal Medicine | Admitting: Internal Medicine

## 2014-02-28 ENCOUNTER — Encounter: Payer: Self-pay | Admitting: Internal Medicine

## 2014-02-28 ENCOUNTER — Ambulatory Visit: Payer: MEDICAID | Attending: Internal Medicine | Admitting: Internal Medicine

## 2014-02-28 VITALS — BP 124/85 | HR 69 | Temp 98.2°F | Resp 16 | Ht 76.0 in | Wt 221.0 lb

## 2014-02-28 DIAGNOSIS — F1010 Alcohol abuse, uncomplicated: Secondary | ICD-10-CM | POA: Insufficient documentation

## 2014-02-28 DIAGNOSIS — F17210 Nicotine dependence, cigarettes, uncomplicated: Secondary | ICD-10-CM | POA: Insufficient documentation

## 2014-02-28 DIAGNOSIS — R109 Unspecified abdominal pain: Secondary | ICD-10-CM | POA: Insufficient documentation

## 2014-02-28 DIAGNOSIS — R110 Nausea: Secondary | ICD-10-CM

## 2014-02-28 DIAGNOSIS — F419 Anxiety disorder, unspecified: Secondary | ICD-10-CM | POA: Insufficient documentation

## 2014-02-28 DIAGNOSIS — I493 Ventricular premature depolarization: Secondary | ICD-10-CM | POA: Insufficient documentation

## 2014-02-28 DIAGNOSIS — F19939 Other psychoactive substance use, unspecified with withdrawal, unspecified: Secondary | ICD-10-CM | POA: Insufficient documentation

## 2014-02-28 DIAGNOSIS — G894 Chronic pain syndrome: Secondary | ICD-10-CM

## 2014-02-28 DIAGNOSIS — F32A Depression, unspecified: Secondary | ICD-10-CM

## 2014-02-28 DIAGNOSIS — I10 Essential (primary) hypertension: Secondary | ICD-10-CM | POA: Insufficient documentation

## 2014-02-28 DIAGNOSIS — F329 Major depressive disorder, single episode, unspecified: Secondary | ICD-10-CM

## 2014-02-28 MED ORDER — ACETAMINOPHEN-CODEINE #3 300-30 MG PO TABS: 1.0000 | ORAL_TABLET | Freq: Four times a day (QID) | ORAL | Status: DC | PRN

## 2014-02-28 MED ORDER — DULOXETINE HCL 60 MG PO CPEP: 60.0000 mg | ORAL_CAPSULE | Freq: Every day | ORAL | Status: DC

## 2014-02-28 MED ORDER — ONDANSETRON HCL 4 MG PO TABS: 4.0000 mg | ORAL_TABLET | Freq: Three times a day (TID) | ORAL | Status: DC | PRN

## 2014-02-28 NOTE — Progress Notes (Signed)
Pt is here following up on his chronic widespread pain. Pt states that for a month he has been experiencing chest pain and SOB.

## 2014-02-28 NOTE — Progress Notes (Signed)
Patient ID: Mark [name], male   DOB: Mar 23, 1965, 48 y.o.   MRN: [id number]   Mark [name], is a 48 y.o. male  [id number]  [id number]  DOB - [birth year]  Chief Complaint  Patient presents with  . Follow-up        Subjective:   Mark [name] is a 48 y.o. male here today for a follow up visit. Patient is known to have major depression, hypertension, hyperlipidemia, alcohol abuse, nicotine abuse, GERD with previous alcohol withdrawal seizure, recently attempted suicide, came in today because of ongoing generalized body pain. Patient has been referred to pain clinic, appointment is coming up on 03/17/2014. He also complained of ongoing abdominal pain, rumbling, with intense nausea. He claims he takes Phenergan for nausea, is requesting Tylenol No. 3 prescription for his ongoing pain until he is able to see pain specialist. He continues to smoke heavily for about half to 1 pack per day, he said he has cut down significantly because he used to smoke 2 packets of cigarettes per day. He denies the use of illicit drugs. His blood pressure has been controlled on metoprolol only, he does not take losartan due to allergic/anaphylaxis reaction. Patient has No headache, No chest pain, No abdominal pain - No Nausea, No new weakness tingling or numbness, No Cough - SOB.  No problems updated.  ALLERGIES: Allergies  Allergen Reactions  . Lisinopril Anaphylaxis and Swelling    Per Dr. Terrence Dupont (12/20/13), patient can tolerate losartan.  . Cozaar [Losartan] Swelling    PAST MEDICAL HISTORY: Past Medical History  Diagnosis Date  . Hypertension   . Hyperlipidemia   . 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 ETOH abuse   . GERD (gastroesophageal reflux disease)   . Mental disorder   . Irregular heart beat   . Anxiety   . Shortness of breath   . Headache(784.0)   . Hepatitis   . Withdrawal seizures   . Alcohol withdrawal   . Neuropathy 06/07/2013  .  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 Seizure due to alcohol withdrawal 01/23/2014    Per patient report    MEDICATIONS AT HOME: Prior to Admission medications   Medication Sig Start Date End Date Taking? Authorizing Provider  albuterol (PROVENTIL HFA;VENTOLIN HFA) 108 (90 BASE) MCG/ACT inhaler Inhale 2 puffs into the lungs every 4 (four) hours as needed for wheezing or shortness of breath. 02/07/14  Yes Encarnacion Slates, NP  cyclobenzaprine (FLEXERIL) 10 MG tablet Take 10 mg by mouth 3 (three) times daily as needed for muscle spasms.   Yes Historical Provider, MD  DULoxetine (CYMBALTA) 60 MG capsule Take 1 capsule (60 mg total) by mouth daily. For depression 02/28/14  Yes Azaryah Heathcock E Doreene Burke, MD  gabapentin (NEURONTIN) 400 MG capsule Take 2 capsules (800 mg total) by mouth 3 (three) times daily. For substance withdrawal syndrome/paina management 02/08/14  Yes Tresa Garter, MD  hydrOXYzine (ATARAX/VISTARIL) 25 MG tablet Take 1 tablet (25 mg total) by mouth every 6 (six) hours as needed for anxiety. 02/08/14  Yes Tresa Garter, MD  metoprolol (LOPRESSOR) 50 MG tablet Take 1 tablet (50 mg total) by mouth 2 (two) times daily. For high blood pressure 02/08/14  Yes Delvin Hedeen E Doreene Burke, MD  QUEtiapine (SEROQUEL) 50 MG tablet Take 1 tablet (50 mg total) by mouth 3 (three) times daily. Take 1 tablet (50 mg) three times daily & 2 tablets (100 mg) at bedtime for agitation/mood  control 02/08/14  Yes Tresa Garter, MD  traZODone (DESYREL) 100 MG tablet Take 1 tablet (100 mg total) by mouth at bedtime as needed and may repeat dose one time if needed for sleep. 02/08/14  Yes Tresa Garter, MD  acetaminophen-codeine (TYLENOL #3) 300-30 MG per tablet Take 1 tablet by mouth every 6 (six) hours as needed. 02/28/14   Tresa Garter, MD  dicyclomine (BENTYL) 10 MG capsule Take 1 capsule (10 mg total) by mouth 4 (four) times daily -  before meals and at bedtime. Patient not taking: Reported on  02/28/2014 02/08/14   Tresa Garter, MD  esomeprazole (NEXIUM) 40 MG capsule Take 1 capsule (40 mg total) by mouth daily at 12 noon. Patient not taking: Reported on 02/28/2014 02/08/14   Tresa Garter, MD  lidocaine (LIDODERM) 5 % Place 1 patch onto the skin daily. Remove & Discard patch within 12 hours or as directed by MD: For pain management Patient not taking: Reported on 02/28/2014 02/07/14   Encarnacion Slates, NP  LORazepam (ATIVAN) 0.5 MG tablet Take 1 tablet (0.5 mg total) by mouth every 8 (eight) hours as needed for anxiety. For anxiety Patient not taking: Reported on 02/28/2014 02/08/14   Tresa Garter, MD  meloxicam (MOBIC) 15 MG tablet Take 1 tablet (15 mg total) by mouth daily. For arthritic pain Patient not taking: Reported on 02/28/2014 02/07/14   Encarnacion Slates, NP  nicotine (NICODERM CQ - DOSED IN MG/24 HOURS) 14 mg/24hr patch Place 1 patch (14 mg total) onto the skin daily. For nicotine addiction Patient not taking: Reported on 02/28/2014 02/07/14   Encarnacion Slates, NP     Objective:   Filed Vitals:   02/28/14 1409  BP: 124/85  Pulse: 69  Temp: 98.2 F (36.8 C)  TempSrc: Oral  Resp: 16  Height: 6\' 4"  (1.93 m)  Weight: 221 lb (100.245 kg)  SpO2: 99%    Exam General appearance : Awake, alert, not in any distress. Speech Clear. Not toxic looking HEENT: Atraumatic and Normocephalic, pupils equally reactive to light and accomodation Neck: supple, no JVD. No cervical lymphadenopathy.  Chest:Good air entry bilaterally, no added sounds  CVS: S1 S2 regular, no murmurs.  Abdomen: Bowel sounds present, Non tender and not distended with no gaurding, rigidity or rebound. Extremities: B/L Lower Ext shows no edema, both legs are warm to touch Neurology: Awake alert, and oriented X 3, CN II-XII intact, Non focal Skin:No Rash Wounds:N/A  Data Review Lab Results  Component Value Date   HGBA1C 6.1* 01/22/2014     Assessment & Plan   Hypertension is  controlled 1. Depression  - DULoxetine (CYMBALTA) 60 MG capsule; Take 1 capsule (60 mg total) by mouth daily. For depression  Dispense: 30 capsule; Refill: 3  2. Chronic pain syndrome  - acetaminophen-codeine (TYLENOL #3) 300-30 MG per tablet; Take 1 tablet by mouth every 6 (six) hours as needed.  Dispense: 60 tablet; Refill: 0  Prescription was given for Zofran 4 mg tablet by mouth every 8 hours when necessary nausea and/or vomiting Patient has been advised not to take losartan to avoid Anaphylaxis.  Patient again was counseled extensively on smoking cessation Patient was counseled about nutrition and exercise   Return in about 3 months (around 05/30/2014), or if symptoms worsen or fail to improve, for Follow up Pain and comorbidities, Follow up HTN.  The patient was given clear instructions to go to ER or return to medical center if symptoms  don't improve, worsen or new problems develop. The patient verbalized understanding. The patient was told to call to get lab results if they haven't heard anything in the next week.   This note has been created with Surveyor, quantity. Any transcriptional errors are unintentional.    Angelica Chessman, MD, [place], [place], [place] and [place], [place]   02/28/2014, 2:57 PM

## 2014-02-28 NOTE — Patient Instructions (Signed)
Chronic Pain Chronic pain can be defined as pain that is off and on and lasts for 3-6 months or longer. Many things cause chronic pain, which can make it difficult to make a diagnosis. There are many treatment options available for chronic pain. However, finding a treatment that works well for you may require trying various approaches until the right one is found. Many people benefit from a combination of two or more types of treatment to control their pain. SYMPTOMS  Chronic pain can occur anywhere in the body and can range from mild to very severe. Some types of chronic pain include:  Headache.  Low back pain.  Cancer pain.  Arthritis pain.  Neurogenic pain. This is pain resulting from damage to nerves. People with chronic pain may also have other symptoms such as:  Depression.  Anger.  Insomnia.  Anxiety. DIAGNOSIS  Your health care provider will help diagnose your condition over time. In many cases, the initial focus will be on excluding possible conditions that could be causing the pain. Depending on your symptoms, your health care provider may order tests to diagnose your condition. Some of these tests may include:   Blood tests.   CT scan.   MRI.   X-rays.   Ultrasounds.   Nerve conduction studies.  You may need to see a specialist.  TREATMENT  Finding treatment that works well may take time. You may be referred to a pain specialist. He or she may prescribe medicine or therapies, such as:   Mindful meditation or yoga.  Shots (injections) of numbing or pain-relieving medicines into the spine or area of pain.  Local electrical stimulation.  Acupuncture.   Massage therapy.   Aroma, color, light, or sound therapy.   Biofeedback.   Working with a physical therapist to keep from getting stiff.   Regular, gentle exercise.   Cognitive or behavioral therapy.   Group support.  Sometimes, surgery may be recommended.  HOME CARE INSTRUCTIONS    Take all medicines as directed by your health care provider.   Lessen stress in your life by relaxing and doing things such as listening to calming music.   Exercise or be active as directed by your health care provider.   Eat a healthy diet and include things such as vegetables, fruits, fish, and lean meats in your diet.   Keep all follow-up appointments with your health care provider.   Attend a support group with others suffering from chronic pain. SEEK MEDICAL CARE IF:   Your pain gets worse.   You develop a new pain that was not there before.   You cannot tolerate medicines given to you by your health care provider.   You have new symptoms since your last visit with your health care provider.  SEEK IMMEDIATE MEDICAL CARE IF:   You feel weak.   You have decreased sensation or numbness.   You lose control of bowel or bladder function.   Your pain suddenly gets much worse.   You develop shaking.  You develop chills.  You develop confusion.  You develop chest pain.  You develop shortness of breath.  MAKE SURE YOU:  Understand these instructions.  Will watch your condition.  Will get help right away if you are not doing well or get worse. Document Released: 11/17/2001 Document Revised: 10/28/2012 Document Reviewed: 08/21/2012 [facility] Patient Information 2015 [place], [place]. This information is not intended to replace advice given to you by your health care provider. Make sure you discuss any  questions you have with your health care provider. ° °Hypertension °Hypertension, commonly called high blood pressure, is when the force of blood pumping through your arteries is too strong. Your arteries are the blood vessels that carry blood from your heart throughout your body. A blood pressure reading consists of a higher number over a lower number, such as 110/72. The higher number (systolic) is the pressure inside your arteries when your heart pumps. The  lower number (diastolic) is the pressure inside your arteries when your heart relaxes. Ideally you want your blood pressure below 120/80. °Hypertension forces your heart to work harder to pump blood. Your arteries may become narrow or stiff. Having hypertension puts you at risk for heart disease, stroke, and other problems.  °RISK FACTORS °Some risk factors for high blood pressure are controllable. Others are not.  °Risk factors you cannot control include:  °· Race. You may be at higher risk if you are African American. °· Age. Risk increases with age. °· Gender. Men are at higher risk than women before age 45 years. After age 65, women are at higher risk than men. °Risk factors you can control include: °· Not getting enough exercise or physical activity. °· Being overweight. °· Getting too much fat, sugar, calories, or salt in your diet. °· Drinking too much alcohol. °SIGNS AND SYMPTOMS °Hypertension does not usually cause signs or symptoms. Extremely high blood pressure (hypertensive crisis) may cause headache, anxiety, shortness of breath, and nosebleed. °DIAGNOSIS  °To check if you have hypertension, your health care provider will measure your blood pressure while you are seated, with your arm held at the level of your heart. It should be measured at least twice using the same arm. Certain conditions can cause a difference in blood pressure between your right and left arms. A blood pressure reading that is higher than normal on one occasion does not mean that you need treatment. If one blood pressure reading is high, ask your health care provider about having it checked again. °TREATMENT  °Treating high blood pressure includes making lifestyle changes and possibly taking medicine. Living a healthy lifestyle can help lower high blood pressure. You may need to change some of your habits. °Lifestyle changes may include: °· Following the DASH diet. This diet is high in fruits, vegetables, and whole grains. It is low  in salt, red meat, and added sugars. °· Getting at least 2½ hours of brisk physical activity every week. °· Losing weight if necessary. °· Not smoking. °· Limiting alcoholic beverages. °· Learning ways to reduce stress. ° If lifestyle changes are not enough to get your blood pressure under control, your health care provider may prescribe medicine. You may need to take more than one. Work closely with your health care provider to understand the risks and benefits. °HOME CARE INSTRUCTIONS °· Have your blood pressure rechecked as directed by your health care provider.   °· Take medicines only as directed by your health care provider. Follow the directions carefully. Blood pressure medicines must be taken as prescribed. The medicine does not work as well when you skip doses. Skipping doses also puts you at risk for problems.   °· Do not smoke.   °· Monitor your blood pressure at home as directed by your health care provider.  °SEEK MEDICAL CARE IF:  °· You think you are having a reaction to medicines taken. °· You have recurrent headaches or feel dizzy. °· You have swelling in your ankles. °· You have trouble with your vision. °SEEK IMMEDIATE MEDICAL   IF:  You develop a severe headache or confusion.  You have unusual weakness, numbness, or feel faint.  You have severe chest or abdominal pain.  You vomit repeatedly.  You have trouble breathing. MAKE SURE YOU:   Understand these instructions.  Will watch your condition.  Will get help right away if you are not doing well or get worse. Document Released: 02/25/2005 Document Revised: 07/12/2013 Document Reviewed: 12/18/2012 [facility] Patient Information 2015 [place], [place]. This information is not intended to replace advice given to you by your health care provider. Make sure you discuss any questions you have with your health care provider.

## 2014-04-12 ENCOUNTER — Emergency Department (INDEPENDENT_AMBULATORY_CARE_PROVIDER_SITE_OTHER)
Admission: EM | Admit: 2014-04-12 | Discharge: 2014-04-12 | Disposition: A | Discharge status: Nursing Home (Includes ICF) | Payer: Self-pay | Source: Home / Self Care | Attending: Emergency Medicine | Admitting: Emergency Medicine

## 2014-04-12 ENCOUNTER — Encounter (HOSPITAL_COMMUNITY): Payer: Self-pay | Admitting: *Deleted

## 2014-04-12 ENCOUNTER — Emergency Department (HOSPITAL_COMMUNITY): Payer: Self-pay

## 2014-04-12 ENCOUNTER — Encounter (HOSPITAL_COMMUNITY): Payer: Self-pay

## 2014-04-12 ENCOUNTER — Emergency Department (HOSPITAL_COMMUNITY)
Admission: EM | Admit: 2014-04-12 | Discharge: 2014-04-12 | Disposition: A | Discharge status: Home / Self Care | Payer: Self-pay | Attending: Emergency Medicine | Admitting: Emergency Medicine

## 2014-04-12 DIAGNOSIS — I209 Angina pectoris, unspecified: Secondary | ICD-10-CM

## 2014-04-12 DIAGNOSIS — Z8639 Personal history of other endocrine, nutritional and metabolic disease: Secondary | ICD-10-CM | POA: Insufficient documentation

## 2014-04-12 DIAGNOSIS — Z720 Tobacco use: Secondary | ICD-10-CM | POA: Insufficient documentation

## 2014-04-12 DIAGNOSIS — F419 Anxiety disorder, unspecified: Secondary | ICD-10-CM | POA: Insufficient documentation

## 2014-04-12 DIAGNOSIS — R1032 Left lower quadrant pain: Secondary | ICD-10-CM | POA: Insufficient documentation

## 2014-04-12 DIAGNOSIS — R1031 Right lower quadrant pain: Secondary | ICD-10-CM | POA: Insufficient documentation

## 2014-04-12 DIAGNOSIS — R079 Chest pain, unspecified: Secondary | ICD-10-CM | POA: Insufficient documentation

## 2014-04-12 DIAGNOSIS — I10 Essential (primary) hypertension: Secondary | ICD-10-CM | POA: Insufficient documentation

## 2014-04-12 DIAGNOSIS — G629 Polyneuropathy, unspecified: Secondary | ICD-10-CM | POA: Insufficient documentation

## 2014-04-12 DIAGNOSIS — K219 Gastro-esophageal reflux disease without esophagitis: Secondary | ICD-10-CM | POA: Insufficient documentation

## 2014-04-12 DIAGNOSIS — Z79899 Other long term (current) drug therapy: Secondary | ICD-10-CM | POA: Insufficient documentation

## 2014-04-12 DIAGNOSIS — R1084 Generalized abdominal pain: Secondary | ICD-10-CM | POA: Insufficient documentation

## 2014-04-12 LAB — CBC WITH DIFFERENTIAL/PLATELET
BASOS ABS: 0 10*3/uL (ref 0.0–0.1)
BASOS PCT: 0 % (ref 0–1)
EOS ABS: 0.2 10*3/uL (ref 0.0–0.7)
Eosinophils Relative: 2 % (ref 0–5)
HCT: 46.1 % (ref 39.0–52.0)
Hemoglobin: 16.1 g/dL (ref 13.0–17.0)
Lymphocytes Relative: 30 % (ref 12–46)
Lymphs Abs: 3.1 10*3/uL (ref 0.7–4.0)
MCH: 28.5 pg (ref 26.0–34.0)
MCHC: 34.9 g/dL (ref 30.0–36.0)
MCV: 81.6 fL (ref 78.0–100.0)
MONO ABS: 0.6 10*3/uL (ref 0.1–1.0)
MONOS PCT: 6 % (ref 3–12)
Neutro Abs: 6.7 10*3/uL (ref 1.7–7.7)
Neutrophils Relative %: 62 % (ref 43–77)
Platelets: 254 10*3/uL (ref 150–400)
RBC: 5.65 MIL/uL (ref 4.22–5.81)
RDW: 14.8 % (ref 11.5–15.5)
WBC: 10.6 10*3/uL — ABNORMAL HIGH (ref 4.0–10.5)

## 2014-04-12 LAB — BASIC METABOLIC PANEL
ANION GAP: 7 (ref 5–15)
BUN: 12 mg/dL (ref 6–23)
CALCIUM: 8.9 mg/dL (ref 8.4–10.5)
CO2: 25 mmol/L (ref 19–32)
Chloride: 104 mmol/L (ref 96–112)
Creatinine, Ser: 0.76 mg/dL (ref 0.50–1.35)
GFR calc non Af Amer: >90 mL/min (ref >90)
GLUCOSE: 92 mg/dL (ref 70–99)
Potassium: 3.9 mmol/L (ref 3.5–5.1)
Sodium: 136 mmol/L (ref 135–145)

## 2014-04-12 LAB — TROPONIN I

## 2014-04-12 LAB — COMPREHENSIVE METABOLIC PANEL
ALBUMIN: 4.6 g/dL (ref 3.5–5.2)
ALT: 24 U/L (ref 0–53)
AST: 22 U/L (ref 0–37)
Alkaline Phosphatase: 87 U/L (ref 39–117)
Anion gap: 9 (ref 5–15)
BUN: 13 mg/dL (ref 6–23)
CALCIUM: 10.1 mg/dL (ref 8.4–10.5)
CO2: 29 mmol/L (ref 19–32)
Chloride: 102 mmol/L (ref 96–112)
Creatinine, Ser: 0.92 mg/dL (ref 0.50–1.35)
Glucose, Bld: 93 mg/dL (ref 70–99)
Potassium: 5.2 mmol/L — ABNORMAL HIGH (ref 3.5–5.1)
Sodium: 140 mmol/L (ref 135–145)
Total Bilirubin: 0.4 mg/dL (ref 0.3–1.2)
Total Protein: 7.2 g/dL (ref 6.0–8.3)

## 2014-04-12 MED ORDER — MORPHINE SULFATE 4 MG/ML IJ SOLN
6.0000 mg | Freq: Once | INTRAMUSCULAR | Status: AC
  Administered 2014-04-12: 6 mg via INTRAVENOUS
  Filled 2014-04-12: qty 2

## 2014-04-12 MED ORDER — SODIUM CHLORIDE 0.9 % IV SOLN
INTRAVENOUS | Status: DC
  Administered 2014-04-12: 19:00:00 via INTRAVENOUS

## 2014-04-12 MED ORDER — ASPIRIN 81 MG PO CHEW
CHEWABLE_TABLET | ORAL | Status: AC
  Filled 2014-04-12: qty 4

## 2014-04-12 MED ORDER — IOHEXOL 300 MG/ML  SOLN
100.0000 mL | Freq: Once | INTRAMUSCULAR | Status: AC | PRN
  Administered 2014-04-12: 100 mL via INTRAVENOUS

## 2014-04-12 MED ORDER — ASPIRIN 81 MG PO CHEW
324.0000 mg | CHEWABLE_TABLET | Freq: Once | ORAL | Status: AC
  Administered 2014-04-12: 324 mg via ORAL

## 2014-04-12 MED ORDER — SODIUM CHLORIDE 0.9 % IV BOLUS (SEPSIS)
1000.0000 mL | Freq: Once | INTRAVENOUS | Status: AC
  Administered 2014-04-12: 1000 mL via INTRAVENOUS

## 2014-04-12 MED ORDER — IOHEXOL 300 MG/ML  SOLN
25.0000 mL | INTRAMUSCULAR | Status: AC
Start: 2014-04-12 — End: 2014-04-12
  Administered 2014-04-12: 25 mL via ORAL

## 2014-04-12 NOTE — ED Provider Notes (Signed)
Chief Complaint   Chest Pain   History of Present Illness    Mark [name] is a 49 year old male with a history of high blood pressure, elevated cholesterol, and history of cigarette smoking who presents tonight with a 2 month history of recurring chest pain, worse in the past couple of weeks. The pain is left and submammary. It radiates into both arms, into his neck, and into his groin areas the pain can last for hours at a time. It's worse in the morning when he first gets up and then again in the evening. It is not exertional. It is associated with shortness of breath, nausea, and diaphoresis. He also feels dizzy, lightheaded, and feels like he is about to faint. He also has some associated headache, blurry vision, and abdominal bloating. He denies any fever, chills, URI symptoms, coughing, wheezing, sputum production, hemoptysis, vomiting, extremity pain or swelling. He has no known history of heart disease, however he does have a history of PVCs and high blood pressure.  Review of Systems    Other than noted above, the patient denies any of the following symptoms. Systemic:  No fever or chills. Pulmonary:  No cough, wheezing, shortness of breath, sputum production, hemoptysis. Cardiac:  No palpitations, rapid heartbeat, dizziness, presyncope or syncope. GI:  No abdominal pain, heartburn, nausea, or vomiting. Ext:  No leg pain or swelling.  [place]    Past medical history, family history, social history, meds, and allergies were reviewed. He has a history of allergy to lisinopril and losartan. He takes Vistaril, metoprolol, Neurontin. Medical problems include high blood pressure, elevated cholesterol, neuropathy, and GERD. He is a cigarette smoker. A maternal grandfather died at age 35 of a heart attack.  Physical Exam     Vital signs:  BP 119/93 mmHg  Pulse 65  Temp(Src) 98.5 F (36.9 C) (Oral)  Resp 20  SpO2 99% Gen:  Alert, oriented, in no distress, skin warm and dry. ENT:   Mucous membranes moist, pharynx clear. Neck:  Supple, no adenopathy or tenderness.  No JVD. Lungs:  Clear to auscultation, no wheezes, rales or rhonchi.  No respiratory distress. Heart:  Regular rhythm.  No gallops, murmers, clicks or rubs. Chest:  No chest wall tenderness. Abdomen:  Soft, nontender, no organomegaly or mass.  Bowel sounds normal.  No pulsatile abdominal mass or bruit. Ext:  No edema.  No calf tenderness and Homann's sign negative.  Pulses full and equal. Skin:  Warm and dry.  No rash.   EKG Results:  Date: 04/12/2014  Rate: 70  Rhythm: normal sinus rhythm  QRS Axis: normal--13  Intervals: normal--QTc interval 423 ms  ST/T Wave abnormalities: normal  Conduction Disutrbances:none  Narrative Interpretation: Normal sinus rhythm, normal EKG  Old EKG Reviewed: none available    Course in [facility] [place]   The following medications were given:  Medications  0.9 %  sodium chloride infusion   aspirin chewable tablet 324 mg  Assessment     The encounter diagnosis was Angina pectoris.  Differential diagnosis is acute coronary syndrome, pulmonary embolism, ruptured aneurysm, pneumothorax, Boerhaave syndrome, pericarditis, musculoskeletal pain, or reflux esophagitis.   Plan     The patient was transferred to the ED via [place] in stable condition.  Medical Decision Making: 49 year old male with HT and Hx of cigarette smoking has a 2 month history of intermittent left submammary chest pain radiating down both arms and into neck and associated with nausea, shortness, of breath, dizziness, presyncope, and diaphoresis.  Pain is non-exertional.  Lasts for hours at a time.  No cardiac history except for PVCs.  His EKG was normal.  Will send via [facility] with IV NS and ASA.  No TNG since he is not having pain now.         Harden Mo,  MD 04/12/14 [phone]

## 2014-04-12 NOTE — ED Notes (Signed)
Pt A&Ox4, ambulatory at d/c with steady gait, NAD 

## 2014-04-12 NOTE — ED Notes (Signed)
Per [facility] - pt c/o left-sided burning pain for several months. Pt given 324 baby aspirin; rates pain 2/10. Pt c/o distended abdomen and bloating; denies pain to palpation. Hx of htn, hyperlipidemia, opioid dependence, alcohol abuse, drug abuse, hepatitis. Prior PVCs, but none on monitor. Previous dx of long qt syndrome. 20G left hand.

## 2014-04-12 NOTE — ED Provider Notes (Signed)
CSN: [id number]     Arrival date & time 04/12/14  1949 History   First MD Initiated Contact with Patient 04/12/14 1958     Chief Complaint  Patient presents with  . Chest Pain     (Consider location/radiation/quality/duration/timing/severity/associated sxs/prior Treatment) Patient is a 49 y.o. male presenting with chest pain. The history is provided by the patient. No language interpreter was used.  Chest Pain Pain location:  L lateral chest Pain quality: pressure   Pain radiates to:  R arm and L arm Pain radiates to the back: no   Pain severity:  Moderate Onset quality:  Gradual Duration:  2 months Timing:  Intermittent Progression:  Unchanged Chronicity:  New Context: movement   Context: not breathing, no drug use, no intercourse, not lifting, not raising an arm and no stress   Relieved by:  Nothing Worsened by:  Nothing tried Ineffective treatments:  None tried Associated symptoms: abdominal pain   Associated symptoms: no anorexia, no anxiety, no cough, no dizziness, no fatigue, no fever, no nausea, no numbness, no shortness of breath, not vomiting and no weakness   Abdominal pain:    Location:  Generalized   Quality:  Aching   Severity:  Moderate   Onset quality:  Gradual   Duration:  2 months   Timing:  Constant   Progression:  Unchanged Risk factors: high cholesterol, hypertension, male sex and smoking   Risk factors: no aortic disease, no birth control and no coronary artery disease     Past Medical History  Diagnosis Date  . Hypertension   . Hyperlipidemia   . 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 ETOH abuse   . GERD (gastroesophageal reflux disease)   . Mental disorder   . Irregular heart beat   . Anxiety   . Shortness of breath   . Headache(784.0)   . Hepatitis   . Withdrawal seizures   . Alcohol withdrawal   . Neuropathy 06/07/2013  . 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 Seizure due to alcohol withdrawal 01/23/2014     Per patient report   Past Surgical History  Procedure Laterality Date  . Back surgery    . Neck surgery    . Fundoplasty transthoracic  2003  . Nasal sinus surgery    . Left heart catheterization with coronary angiogram N/A 01/13/2014    Procedure: LEFT HEART CATHETERIZATION WITH CORONARY ANGIOGRAM;  Surgeon: Clent Demark, MD;  Location: [facility] CATH LAB;  Service: Cardiovascular;  Laterality: N/A;   Family History  Problem Relation Age of Onset  . Breast cancer Paternal Grandmother   . Lung cancer      was a smoker  . Hypertension Mother    History  Substance Use Topics  . Smoking status: Current Every Day Smoker -- 0.50 packs/day for 30 years    Types: Cigarettes  . Smokeless tobacco: Never Used  . Alcohol Use: No     Comment: occasionally     Review of Systems  Constitutional: Negative for fever and fatigue.  Respiratory: Negative for cough and shortness of breath.   Cardiovascular: Positive for chest pain.  Gastrointestinal: Positive for abdominal pain. Negative for nausea, vomiting, diarrhea, constipation and anorexia.  Genitourinary: Negative for dysuria, urgency and frequency.  Neurological: Negative for dizziness, weakness and numbness.  All other systems reviewed and are negative.     Allergies  Lisinopril and Cozaar  Home Medications   Prior to Admission  medications   Medication Sig Start Date End Date Taking? Authorizing Provider  albuterol (PROVENTIL HFA;VENTOLIN HFA) 108 (90 BASE) MCG/ACT inhaler Inhale 2 puffs into the lungs every 4 (four) hours as needed for wheezing or shortness of breath. 02/07/14  Yes Encarnacion Slates, NP  gabapentin (NEURONTIN) 300 MG capsule Take 900 mg by mouth 3 (three) times daily.   Yes Historical Provider, MD  hydrOXYzine (ATARAX/VISTARIL) 50 MG tablet Take 50 mg by mouth 3 (three) times daily as needed for anxiety.    Yes Historical Provider, MD  metoprolol (LOPRESSOR) 50 MG tablet Take 1 tablet (50 mg total) by mouth 2 (two)  times daily. For high blood pressure 02/08/14  Yes Olugbemiga E Doreene Burke, MD  acetaminophen-codeine (TYLENOL #3) 300-30 MG per tablet Take 1 tablet by mouth every 6 (six) hours as needed. Patient not taking: Reported on 04/12/2014 02/28/14   Tresa Garter, MD  dicyclomine (BENTYL) 10 MG capsule Take 1 capsule (10 mg total) by mouth 4 (four) times daily -  before meals and at bedtime. Patient not taking: Reported on 02/28/2014 02/08/14   Tresa Garter, MD  DULoxetine (CYMBALTA) 60 MG capsule Take 1 capsule (60 mg total) by mouth daily. For depression 02/28/14   Tresa Garter, MD  esomeprazole (NEXIUM) 40 MG capsule Take 1 capsule (40 mg total) by mouth daily at 12 noon. Patient not taking: Reported on 02/28/2014 02/08/14   Tresa Garter, MD  gabapentin (NEURONTIN) 400 MG capsule Take 2 capsules (800 mg total) by mouth 3 (three) times daily. For substance withdrawal syndrome/paina management Patient not taking: Reported on 04/12/2014 02/08/14   Tresa Garter, MD  hydrOXYzine (ATARAX/VISTARIL) 25 MG tablet Take 1 tablet (25 mg total) by mouth every 6 (six) hours as needed for anxiety. Patient not taking: Reported on 04/12/2014 02/08/14   Tresa Garter, MD  lidocaine (LIDODERM) 5 % Place 1 patch onto the skin daily. Remove & Discard patch within 12 hours or as directed by MD: For pain management Patient not taking: Reported on 02/28/2014 02/07/14   Encarnacion Slates, NP  LORazepam (ATIVAN) 0.5 MG tablet Take 1 tablet (0.5 mg total) by mouth every 8 (eight) hours as needed for anxiety. For anxiety Patient not taking: Reported on 02/28/2014 02/08/14   Tresa Garter, MD  meloxicam (MOBIC) 15 MG tablet Take 1 tablet (15 mg total) by mouth daily. For arthritic pain Patient not taking: Reported on 02/28/2014 02/07/14   Encarnacion Slates, NP  nicotine (NICODERM CQ - DOSED IN MG/24 HOURS) 14 mg/24hr patch Place 1 patch (14 mg total) onto the skin daily. For nicotine addiction Patient not  taking: Reported on 02/28/2014 02/07/14   Encarnacion Slates, NP  ondansetron (ZOFRAN) 4 MG tablet Take 1 tablet (4 mg total) by mouth every 8 (eight) hours as needed for nausea or vomiting. Patient not taking: Reported on 04/12/2014 02/28/14   Tresa Garter, MD  QUEtiapine (SEROQUEL) 50 MG tablet Take 1 tablet (50 mg total) by mouth 3 (three) times daily. Take 1 tablet (50 mg) three times daily & 2 tablets (100 mg) at bedtime for agitation/mood control Patient not taking: Reported on 04/12/2014 02/08/14   Tresa Garter, MD  traZODone (DESYREL) 100 MG tablet Take 1 tablet (100 mg total) by mouth at bedtime as needed and may repeat dose one time if needed for sleep. Patient not taking: Reported on 04/12/2014 02/08/14   Tresa Garter, MD   BP 132/85 mmHg  Pulse 56  Temp(Src) 97.1 F (36.2 C) (Oral)  Resp 14  Ht 6\' 4"  (1.93 m)  Wt 225 lb (102.059 kg)  BMI 27.40 kg/m2  SpO2 96% Physical Exam  Constitutional: He is oriented to person, place, and time. He appears well-developed and well-nourished. No distress.  HENT:  Head: Normocephalic and atraumatic.  Eyes: Pupils are equal, round, and reactive to light.  Neck: Normal range of motion.  Cardiovascular: Normal rate, regular rhythm and normal heart sounds.   Pulmonary/Chest: Effort normal and breath sounds normal. No respiratory distress. He has no decreased breath sounds. He has no wheezes. He has no rhonchi. He has no rales.  Abdominal: Soft. He exhibits no distension. There is tenderness in the right lower quadrant and left lower quadrant. There is guarding. There is no rebound.  Musculoskeletal: He exhibits no edema or tenderness.  Neurological: He is alert and oriented to person, place, and time. He exhibits normal muscle tone.  Skin: Skin is warm and dry.  Nursing note and vitals reviewed.   ED Course  Procedures (including critical care time) Labs Review Labs Reviewed  CBC WITH DIFFERENTIAL/PLATELET - Abnormal; Notable for  the following:    WBC 10.6 (*)    All other components within normal limits  COMPREHENSIVE METABOLIC PANEL - Abnormal; Notable for the following:    Potassium 5.2 (*)    All other components within normal limits  TROPONIN I  BASIC METABOLIC PANEL  TROPONIN I    Imaging Review Dg Chest 2 View  04/12/2014   CLINICAL DATA:  Left-sided chest pain and shortness of breath. History of hypertension.  EXAM: CHEST  2 VIEW  COMPARISON:  06/07/2013  FINDINGS: The heart size and mediastinal contours are within normal limits. Lungs are hypoaerated with crowding of the bronchovascular markings. The visualized skeletal structures are unremarkable. Cervical fusion hardware noted with discontinuity of several screws reidentified but not further specifically evaluated on the current exam.  IMPRESSION: Lungs are hypoaerated with crowding of the bronchovascular markings. If symptoms persist, consider PA and lateral chest radiographs obtained at full inspiration when the patient is clinically able.   Electronically Signed   By: Conchita Paris M.D.   On: 04/12/2014 21:21   Ct Abdomen Pelvis W Contrast  04/12/2014   CLINICAL DATA:  Abdominal pain. Nausea, bloating. Night sweats. Chest pain.  EXAM: CT ABDOMEN AND PELVIS WITH CONTRAST  TECHNIQUE: Multidetector CT imaging of the abdomen and pelvis was performed using the standard protocol following bolus administration of intravenous contrast.  CONTRAST:  142mL OMNIPAQUE IOHEXOL 300 MG/ML  SOLN  COMPARISON:  CT 01/20/2013  FINDINGS: Mild dependent hypoventilatory atelectasis at the lung bases.  Mild hepatic steatosis, no focal hepatic lesion. The gallbladder is physiologically distended, no biliary dilatation. The spleen, adrenal glands, pancreas and kidneys are normal. No hydronephrosis or perinephric stranding. There are no dilated or thickened bowel loops. Post fundoplication change at the gastroesophageal junction, unchanged in appearance. The appendix is normal. No free  air, free fluid, or intra-abdominal fluid collection.  There is atherosclerosis of the abdominal aorta without aneurysm. No retroperitoneal adenopathy.  The bladder is physiologically distended. Prostate gland is normal in size. Minimal fat in both inguinal canals. No pelvic adenopathy or free fluid.  There is no acute osseous abnormality. Incidental note of avascular necrosis of both femoral heads without evidence of collapse, a stable finding. There is postsurgical change in the lumbar spine at L5-S1. Pain sclerotic density in the right femur is unchanged.  IMPRESSION:  1. No acute abnormality in the abdomen/pelvis. 2. Mild hepatic steatosis. 3. Incidental note of atherosclerosis. 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   Electronically Signed   By: Jeb Levering M.D.   On: 04/12/2014 23:13     EKG Interpretation   Date/Time:  Tuesday April 12 2014 19:55:42 EST Ventricular Rate:  63 PR Interval:  156 QRS Duration: 99 QT Interval:  428 QTC Calculation: 438 R Axis:   50 Text Interpretation:  Sinus rhythm No significant change since last  tracing Confirmed by HARRISON  MD, FORREST ([id number]) on 04/12/2014 8:06:34 PM      MDM   Final diagnoses:  Chest pain, unspecified chest pain type  Generalized abdominal pain   Patient is a 49 year old Caucasian male with pertinent past medical history of hypertension and hyperlipidemia who comes to the emergency department today with chest pain and abdominal pain which has been present intermittently for the past 2 months. Physical exam as above. Initial differential is concerning for pneumonia, pneumothorax, PE, MI, ACS, appendicitis, acute cholecystitis, and diverticulitis. Initial workup included a CBC, CMP, troponin, EKG, chest x-ray, and a CT of the abdomen and pelvis. CMP had a potassium of 5.2 otherwise unremarkable a repeat potassium was obtained which was 3.9 as result I feel the initial  potassium was an error. CBC was unremarkable. Initial troponin was negative.  Patient has a HEAR score of 3 as a result a delta troponin should be sufficient to rule out MI. Repeat troponin was obtained 3 hours later which was also negative as resulted doubt MI. Patient is PERC negative I have low suspicion of PE and do not feel he requires further evaluation for this at this time.  Chest x-ray demonstrated no consolidations doubt pneumonia. There was no evidence of pneumothorax. CT of the abdomen demonstrated no acute findings doubt appendicitis or diverticulitis. Patient was felt to be stable for discharge at this time. He was treated with morphine for his pain with moderate improvement. At the time of discharge patient requested pain medications for chronic pain in his feet. He was informed that chronic pain should be treated by his primary care physician and referred to them for further management of his pain. The patient expressed understanding. He was discharged in good condition. Labs and imaging reviewed by myself and considered in medical decision making. Imaging was interpreted by radiology. Care was discussed with my attending Dr. Aline Brochure.       Katheren Shams, MD 04/12/14 [id number]  Pamella Pert, MD 04/12/14 [phone]

## 2014-04-12 NOTE — Discharge Instructions (Signed)
We have determined that your problem requires further evaluation in the emergency department.  We will take care of your transport there.  Once at the emergency department, you will be evaluated by a provider and they will order whatever treatment or tests they deem necessary.  We cannot guarantee that they will do any specific test or do any specific treatment.  ° °

## 2014-04-12 NOTE — ED Notes (Signed)
[facility] called. Report called Nancy Nordmann

## 2014-04-12 NOTE — ED Notes (Signed)
CT made aware that pt finished contrast.

## 2014-04-12 NOTE — ED Notes (Signed)
[facility] here- report given to RN.

## 2014-04-12 NOTE — ED Notes (Addendum)
Chest pain L ant. chest pain off and on since Thanksgiving, radiates to arms ( arms get hot and cold, so cold it feels hot), nausea off and on, bloating all over, having night sweats x 3 weeks.  Chest pain steady past 2 weeks.  Feels dizzy and flushed.  Hx. PVC's. Also feels pain in upper inner thighs.

## 2014-05-12 ENCOUNTER — Ambulatory Visit: Payer: Self-pay | Attending: Internal Medicine | Admitting: Family Medicine

## 2014-05-12 VITALS — BP 156/127 | HR 61 | Temp 98.2°F | Resp 16 | Ht 76.0 in | Wt 220.0 lb

## 2014-05-12 DIAGNOSIS — Y929 Unspecified place or not applicable: Secondary | ICD-10-CM | POA: Insufficient documentation

## 2014-05-12 DIAGNOSIS — Y999 Unspecified external cause status: Secondary | ICD-10-CM | POA: Insufficient documentation

## 2014-05-12 DIAGNOSIS — G8929 Other chronic pain: Secondary | ICD-10-CM | POA: Insufficient documentation

## 2014-05-12 DIAGNOSIS — W19XXXA Unspecified fall, initial encounter: Secondary | ICD-10-CM | POA: Insufficient documentation

## 2014-05-12 DIAGNOSIS — Y939 Activity, unspecified: Secondary | ICD-10-CM | POA: Insufficient documentation

## 2014-05-12 DIAGNOSIS — M25511 Pain in right shoulder: Secondary | ICD-10-CM

## 2014-05-12 DIAGNOSIS — S4991XA Unspecified injury of right shoulder and upper arm, initial encounter: Secondary | ICD-10-CM | POA: Insufficient documentation

## 2014-05-12 DIAGNOSIS — G894 Chronic pain syndrome: Secondary | ICD-10-CM

## 2014-05-12 HISTORY — DX: Other chronic pain: G89.29

## 2014-05-12 HISTORY — DX: Pain in right shoulder: M25.511

## 2014-05-12 NOTE — Assessment & Plan Note (Signed)
Subjectively he is alert oriented appropriate in no acute distress there is some tenderness around the muscles of upper right arm there is no deformity he has full range of motion grips are strong and equal.  Plan is to do the referral to the pain clinic. As this is an acute injury, I am prescribing Tylenol No. 3 No. 10 one every 4 hours when necessary for pain I have explained to him that any further pain medications he might need we'll have to go through Dr. Doreene Burke immunotherapy requires another visit. I have advised heat to the area several times a day assistant about a week since this happened and have recommended gentle range of motion.

## 2014-05-12 NOTE — Progress Notes (Signed)
Patient here fell last Thursday and injured shoulder pain is 8/10  Patient is not taking anything for pain currently and would like Tylenol 3 Rx Patient is also asking for referral to [facility] pain mgmt for his chronic back and neck pain

## 2014-05-12 NOTE — Patient Instructions (Signed)
Ice to shoulder for 2-3 days, then heat.

## 2014-05-13 ENCOUNTER — Other Ambulatory Visit: Payer: Self-pay | Admitting: Internal Medicine

## 2014-05-30 ENCOUNTER — Other Ambulatory Visit: Payer: Self-pay | Admitting: Internal Medicine

## 2014-06-02 ENCOUNTER — Encounter: Payer: Self-pay | Admitting: Family Medicine

## 2014-06-02 ENCOUNTER — Ambulatory Visit: Payer: Self-pay | Attending: Family Medicine | Admitting: Family Medicine

## 2014-06-02 ENCOUNTER — Ambulatory Visit (HOSPITAL_COMMUNITY)
Admission: RE | Admit: 2014-06-02 | Discharge: 2014-06-02 | Disposition: A | Discharge status: Home / Self Care | Payer: Self-pay | Source: Ambulatory Visit | Attending: Family Medicine | Admitting: Family Medicine

## 2014-06-02 VITALS — BP 129/87 | HR 69 | Temp 98.4°F | Resp 16 | Ht 76.0 in | Wt 214.0 lb

## 2014-06-02 DIAGNOSIS — M25511 Pain in right shoulder: Secondary | ICD-10-CM | POA: Insufficient documentation

## 2014-06-02 DIAGNOSIS — G629 Polyneuropathy, unspecified: Secondary | ICD-10-CM | POA: Insufficient documentation

## 2014-06-02 DIAGNOSIS — Z9181 History of falling: Secondary | ICD-10-CM | POA: Insufficient documentation

## 2014-06-02 DIAGNOSIS — M545 Low back pain: Secondary | ICD-10-CM

## 2014-06-02 DIAGNOSIS — G8929 Other chronic pain: Secondary | ICD-10-CM

## 2014-06-02 DIAGNOSIS — M5416 Radiculopathy, lumbar region: Secondary | ICD-10-CM

## 2014-06-02 DIAGNOSIS — W19XXXA Unspecified fall, initial encounter: Secondary | ICD-10-CM | POA: Insufficient documentation

## 2014-06-02 DIAGNOSIS — R1030 Lower abdominal pain, unspecified: Secondary | ICD-10-CM

## 2014-06-02 DIAGNOSIS — M549 Dorsalgia, unspecified: Secondary | ICD-10-CM | POA: Insufficient documentation

## 2014-06-02 DIAGNOSIS — G894 Chronic pain syndrome: Secondary | ICD-10-CM

## 2014-06-02 DIAGNOSIS — M542 Cervicalgia: Secondary | ICD-10-CM | POA: Insufficient documentation

## 2014-06-02 HISTORY — DX: Other chronic pain: G89.29

## 2014-06-02 LAB — CBC
HEMATOCRIT: 47.3 % (ref 39.0–52.0)
Hemoglobin: 16.6 g/dL (ref 13.0–17.0)
MCH: 28.3 pg (ref 26.0–34.0)
MCHC: 35.1 g/dL (ref 30.0–36.0)
MCV: 80.6 fL (ref 78.0–100.0)
MPV: 9.5 fL (ref 8.6–12.4)
PLATELETS: 335 10*3/uL (ref 150–400)
RBC: 5.87 MIL/uL — ABNORMAL HIGH (ref 4.22–5.81)
RDW: 14 % (ref 11.5–15.5)
WBC: 7.4 10*3/uL (ref 4.0–10.5)

## 2014-06-02 LAB — COMPLETE METABOLIC PANEL WITH GFR
ALBUMIN: 4.9 g/dL (ref 3.5–5.2)
ALK PHOS: 99 U/L (ref 39–117)
ALT: 27 U/L (ref 0–53)
AST: 20 U/L (ref 0–37)
BUN: 9 mg/dL (ref 6–23)
CO2: 30 mEq/L (ref 19–32)
Calcium: 10.2 mg/dL (ref 8.4–10.5)
Chloride: 98 mEq/L (ref 96–112)
Creat: 0.84 mg/dL (ref 0.50–1.35)
GFR, Est African American: >89 mL/min
Glucose, Bld: 99 mg/dL (ref 70–99)
POTASSIUM: 5.3 meq/L (ref 3.5–5.3)
SODIUM: 139 meq/L (ref 135–145)
TOTAL PROTEIN: 7.4 g/dL (ref 6.0–8.3)
Total Bilirubin: 0.5 mg/dL (ref 0.2–1.2)

## 2014-06-02 LAB — POCT URINALYSIS DIPSTICK
BILIRUBIN UA: NEGATIVE
Blood, UA: NEGATIVE
Glucose, UA: NEGATIVE
Ketones, UA: NEGATIVE
LEUKOCYTES UA: NEGATIVE
NITRITE UA: NEGATIVE
PH UA: 7
Protein, UA: NEGATIVE
Spec Grav, UA: 1.015
UROBILINOGEN UA: 0.2

## 2014-06-02 LAB — LIPASE: LIPASE: 14 U/L (ref 0–75)

## 2014-06-02 MED ORDER — ACETAMINOPHEN-CODEINE #3 300-30 MG PO TABS: 1.0000 | ORAL_TABLET | Freq: Two times a day (BID) | ORAL | Status: DC | PRN

## 2014-06-02 MED ORDER — CYCLOBENZAPRINE HCL 10 MG PO TABS: 10.0000 mg | ORAL_TABLET | Freq: Two times a day (BID) | ORAL | Status: DC | PRN

## 2014-06-02 NOTE — Progress Notes (Signed)
   Subjective:    Patient ID: Mark [name], male    DOB: [birth year], 49 y.o.   MRN: [id number] CC: pain in neck and back  HPI  1. Chronic pain: in neck and back. In 2000 neck surgery. In 2001 back surgery. Chronic pain since. Pain from back radiates down to back of legs into knees. Achy all the time and does not feel well. Pain interferes with function and quality of life. Some weakness in legs. Had a fall 3 weeks ago. Hit neck and shoulder. Taking gabapentin for neuropathy. Feeling miserable and depressed and having trouble sleeping. Been over every pain medicine available, patches. Oxycodone gave relief. Tramadol with tylenol #3 does help. Had cortisone injections in facets of neck that helped for a little bit. Tried Cymbalta felt like a zombie and quit taking it. Feels like depression is because he hurts not the other way around.   2. R shoulder pain: x 2 weeks. Following fall. Had a previous injury in early 28s. Pain with lifting arm and lying on R side.   Substances: no ETOH. Last drink about 3 months. No illegal substance. No THC. 1/2 PPD cigarettes trying to quit (previously 2 packs per day).    Review of Systems MSK: back pain, neck pain, R shoulder pain     Objective:   Physical Exam BP 129/87 mmHg  Pulse 69  Temp(Src) 98.4 F (36.9 C) (Oral)  Resp 16  Ht 6\' 4"  (1.93 m)  Wt 214 lb (97.07 kg)  BMI 26.06 kg/m2  SpO2 99% General appearance: alert, cooperative and no distress Back: symmetric, no curvature. ROM normal. No CVA tenderness. Lungs: clear to auscultation bilaterally Heart: regular rate and rhythm, S1, S2 normal, no murmur, click, rub or gallop Abdomen: soft, non-tender; bowel sounds normal; no masses,  no organomegaly Back Exam: Back: Normal Curvature, no deformities or CVA tenderness  Paraspinal Tenderness: lumbar area   LE Strength 5/5  LE Sensation: in tact  LE Reflexes 2+ and symmetric  Straight leg raise: indeterminate   R shoulder: pain with adduction  beyond 30 degrees. Lateral shoulder pain.       Assessment & Plan:

## 2014-06-02 NOTE — Patient Instructions (Signed)
Mark [name],  1. R shoulder pain:  X-ray  sports medicine referral.   2. Chronic pain:  pain management referral.  Flexeril tylenol #3 Continue gabapentin  Normal urinalysis today.   F/u in 4 weeks to further evaluate stomach symptoms  Dr. Adrian Blackwater

## 2014-06-02 NOTE — Progress Notes (Signed)
Establsih Care Complaining of neck and back pain Hx of back surgery Stated injury 3 wk ago

## 2014-06-03 NOTE — Assessment & Plan Note (Signed)
Chronic pain:  pain management referral.  Flexeril tylenol #3 Continue gabapentin

## 2014-06-03 NOTE — Assessment & Plan Note (Signed)
R shoulder pain:  X-ray  sports medicine referral.

## 2014-06-10 ENCOUNTER — Ambulatory Visit: Payer: Self-pay | Attending: Internal Medicine

## 2014-06-15 ENCOUNTER — Telehealth: Payer: Self-pay | Admitting: *Deleted

## 2014-06-15 NOTE — Telephone Encounter (Signed)
-----   Message from Boykin Nearing, MD sent at 06/03/2014  9:04 AM EDT ----- Shoulder x-ray negative for fracture or dislocation

## 2014-06-15 NOTE — Telephone Encounter (Signed)
-----   Message from Boykin Nearing, MD sent at 06/03/2014  4:53 PM EDT ----- All labs normal including lipase, checking on pancreas

## 2014-06-15 NOTE — Telephone Encounter (Signed)
Pt aware of X-ray and labs

## 2014-06-22 ENCOUNTER — Encounter: Payer: Self-pay | Admitting: Sports Medicine

## 2014-06-22 ENCOUNTER — Ambulatory Visit (INDEPENDENT_AMBULATORY_CARE_PROVIDER_SITE_OTHER): Payer: Self-pay | Admitting: Sports Medicine

## 2014-06-22 VITALS — BP 158/113 | Ht 76.0 in | Wt 220.0 lb

## 2014-06-22 DIAGNOSIS — M25511 Pain in right shoulder: Secondary | ICD-10-CM

## 2014-06-22 DIAGNOSIS — G894 Chronic pain syndrome: Secondary | ICD-10-CM

## 2014-06-22 DIAGNOSIS — G629 Polyneuropathy, unspecified: Secondary | ICD-10-CM

## 2014-06-22 DIAGNOSIS — Z720 Tobacco use: Secondary | ICD-10-CM

## 2014-06-22 DIAGNOSIS — I10 Essential (primary) hypertension: Secondary | ICD-10-CM

## 2014-06-22 MED ORDER — CYCLOBENZAPRINE HCL 10 MG PO TABS: 10.0000 mg | ORAL_TABLET | Freq: Two times a day (BID) | ORAL | Status: DC | PRN

## 2014-06-22 MED ORDER — METHYLPREDNISOLONE ACETATE 40 MG/ML IJ SUSP
40.0000 mg | Freq: Once | INTRAMUSCULAR | Status: AC
  Administered 2014-06-22: 40 mg via INTRA_ARTICULAR

## 2014-06-22 NOTE — Assessment & Plan Note (Signed)
Encouraged to start taking multivitamin given history of alcohol use and potential alcohol-related neuropathy. If persistent or not improving Consider further metabolic evaluation versus EMGs confirm diagnosis and to differentiate neck and back issues.

## 2014-06-22 NOTE — Assessment & Plan Note (Signed)
Avoid opioid narcotics given history of opioid abuse and dependence Pain control today with Flexeril and injection

## 2014-06-22 NOTE — Progress Notes (Signed)
Mark [name] - 49 y.o. male MRN [id number]  Date of birth: 12/28/1965  SUBJECTIVE:No chief complaint on file.  CC: 1.  right shoulder pain, initial evaluation      HPI:   patient reports fall in getting of March where refill on his right side striking the posterior aspect of the shoulder.  His had progressive pain, worsening range of motion and strength since that time.  Occasional bilateral numbness and tingling in hands has been present prior to the fall. Chronic neck and back issues status post cervical fusion and secondary Chronic pain associated with this. Also history of bilateral lower summary peripheral neuropathy.  Previously on methadone.  Pain is worse with any type of flexion, abduction. He has weakness with these motions as well  Has tried taking anti-inflammatories without significant improvement. Given that Tylenol No. 3 without significant improvement. Performing basic home exercises with Codman's exercises      ROS:  per HPI    HISTORY:  Past Medical, Surgical, Social, and Family History reviewed & updated per EMR.  Pertinent Historical Findings include: Social History   Occupational History  . Mark [name]    Social History Main Topics  . Smoking status: Current Every Day Smoker -- 0.50 packs/day for 30 years    Types: Cigarettes  . Smokeless tobacco: Never Used  . Alcohol Use: No     Comment: occasionally   . Drug Use: No     Comment: Opana  . Sexual Activity: No    No specialty comments available. Problem  Pain in Joint, Shoulder Region   [place] in March 2016 on right shoulder. 06/22/2014: [facility] [place]:  Partial full-thickness tear with minimal retraction of supraspinatus. Status post injection and referral to PT of   Neuropathy   Patient reports due to unknown etiology (history of alcohol abuse)   Chronic Pain Syndrome   Acute right shoulder pain in March 2016 Chronic neck and low back pain History of methadone use Bilateral peripheral neuropathy of  unknown etiology (?/Likely alcohol related versus methadone related according to patient)   OBJECTIVE:  VS:   HT:6\' 4"  (193 cm)   WT:220 lb (99.791 kg)  BMI:26.8          BP:(!) 158/113 mmHg  HR: bpm  TEMP: ( )  RESP:   PHYSICAL EXAM: GENERAL: Adult Caucasian male. No acute distress PSYCH: Alert and appropriately interactive. SKIN: No open skin lesions or abnormal skin markings on areas inspected as below VASCULAR: radial pulses 2+/4. No significant UE edema NEURO: Upper extremity strength is 5+/5 in all myotomes; sensation is intact to light touch in all dermatomes. RIGHT SHOULDER: Overall well aligned no significant deformity. No bruising or ecchymosis. He has marked active range of motion in flexion, abduction. Only 80 of abduction 90 of flexion. Positive drop arm test. Pain with Hawkins, Neers, speeds, O'Brien's. Internal/external rotation strength 5/5 with arms at the side. Empty can testing markedly painful with 4/5 strength  DATA OBTAINED:   Limited [facility] Ultrasound of Right Shoulder: Findings: Biceps Tendon: Intact Pec Major Insertion: normal Subscapularis Tendon: thickened but intact Supraspinatus Tendon: chronically thickened, partial full thickness tear midsubstance Infraspinatus/Teres Minor Tendon: intact without significant tear AC Joint: + mushroom sign  Impression: The above findings are consistent with partial full thickness tear of supraspinatus     ASSESSMENT & PLAN: See problem based charting & AVS for additional documentation Problem List Items Addressed This Visit    Chronic pain syndrome (Chronic)    Avoid opioid narcotics given  history of opioid abuse and dependence Pain control today with Flexeril and injection      Pain in joint, shoulder region - Primary (Chronic)    PROCEDURE NOTE : Right Subacromial Injection After discussing the risks, benefits and expected outcomes of the injection and all questions were reviewed and answered,  he wished to undergo  the above named procedure.  Written consent was obtained. After an appropriate time out was taken the right shoulder was sterilely prepped and injected as below: Prep:    Betadine and alcohol,  Ethel chloride.  Approach:  Posterior Needle:  22-gauge, 1.5 inch Meds:   3 mL 1% lidocaine, 1 mL 40 mg Depo-Medrol A bandaid was applied to the area. This procedure was well tolerated and there were no complications.    Referral to physical therapy placed today. Continue home exercises including Codman exercises. Concern for early frozen shoulder. > Consider: MRI at follow-up if no significant improvement       Relevant Medications   methylPREDNISolone acetate (DEPO-MEDROL) injection 40 mg (Completed)   Other Relevant Orders   Ambulatory referral to Physical Therapy   Tobacco abuse   HTN (hypertension)   Neuropathy    Encouraged to start taking multivitamin given history of alcohol use and potential alcohol-related neuropathy. If persistent or not improving Consider further metabolic evaluation versus EMGs confirm diagnosis and to differentiate neck and back issues.         FOLLOW UP:  Return in about 5 weeks (around 07/27/2014) for repeat ultrasound versus MRI.

## 2014-06-22 NOTE — Procedures (Signed)
Limited [facility] Ultrasound of Right Shoulder: Findings: Biceps Tendon: Intact Pec Major Insertion: normal Subscapularis Tendon: thickened but intact Supraspinatus Tendon: chronically thickened, partial full thickness tear midsubstance Infraspinatus/Teres Minor Tendon: intact without significant tear AC Joint: + mushroom sign  Impression: The above findings are consistent with partial full thickness tear of supraspinatus

## 2014-06-22 NOTE — Assessment & Plan Note (Signed)
PROCEDURE NOTE : Right Subacromial Injection After discussing the risks, benefits and expected outcomes of the injection and all questions were reviewed and answered,  he wished to undergo the above named procedure.  Written consent was obtained. After an appropriate time out was taken the right shoulder was sterilely prepped and injected as below: Prep:    Betadine and alcohol,  Ethel chloride.  Approach:  Posterior Needle:  22-gauge, 1.5 inch Meds:   3 mL 1% lidocaine, 1 mL 40 mg Depo-Medrol A bandaid was applied to the area. This procedure was well tolerated and there were no complications.    Referral to physical therapy placed today. Continue home exercises including Codman exercises. Concern for early frozen shoulder. > Consider: MRI at follow-up if no significant improvement

## 2014-06-23 ENCOUNTER — Encounter (HOSPITAL_COMMUNITY): Payer: Self-pay | Admitting: *Deleted

## 2014-06-23 ENCOUNTER — Emergency Department (HOSPITAL_COMMUNITY)
Admission: EM | Admit: 2014-06-23 | Discharge: 2014-06-23 | Discharge status: Against Medical Advice | Payer: Self-pay | Attending: Emergency Medicine | Admitting: Emergency Medicine

## 2014-06-23 DIAGNOSIS — IMO0001 Reserved for inherently not codable concepts without codable children: Secondary | ICD-10-CM

## 2014-06-23 DIAGNOSIS — Z8659 Personal history of other mental and behavioral disorders: Secondary | ICD-10-CM | POA: Insufficient documentation

## 2014-06-23 DIAGNOSIS — Z79899 Other long term (current) drug therapy: Secondary | ICD-10-CM | POA: Insufficient documentation

## 2014-06-23 DIAGNOSIS — G629 Polyneuropathy, unspecified: Secondary | ICD-10-CM | POA: Insufficient documentation

## 2014-06-23 DIAGNOSIS — Z720 Tobacco use: Secondary | ICD-10-CM | POA: Insufficient documentation

## 2014-06-23 DIAGNOSIS — Z8639 Personal history of other endocrine, nutritional and metabolic disease: Secondary | ICD-10-CM | POA: Insufficient documentation

## 2014-06-23 DIAGNOSIS — F10129 Alcohol abuse with intoxication, unspecified: Secondary | ICD-10-CM | POA: Insufficient documentation

## 2014-06-23 DIAGNOSIS — K219 Gastro-esophageal reflux disease without esophagitis: Secondary | ICD-10-CM | POA: Insufficient documentation

## 2014-06-23 DIAGNOSIS — F1020 Alcohol dependence, uncomplicated: Secondary | ICD-10-CM

## 2014-06-23 DIAGNOSIS — I10 Essential (primary) hypertension: Secondary | ICD-10-CM | POA: Insufficient documentation

## 2014-06-23 NOTE — ED Notes (Signed)
Mark [name] pt's daughter requests update on pt on [phone], she is pt's ride.

## 2014-06-23 NOTE — ED Notes (Signed)
Pt sts his daughter called EMS because he was drinking too much. He is crying and sts "I drink because I'm hurting all over, non-stop." When asked about having SI/HI pt denies it, states "I just want this shit to stop. I want to live normal life without pain."

## 2014-06-23 NOTE — ED Notes (Signed)
Bed: MM21 Expected date:  Expected time:  Means of arrival:  Comments: hold

## 2014-06-23 NOTE — ED Notes (Signed)
Per EMS pt coming from home with c/o ETOH intoxication, pt sts he drank at least 1/5 of liquor today due to "pain from head to toe". Pt also reports chronic back pain and sts he was only given Tylenol for it.

## 2014-06-23 NOTE — ED Notes (Signed)
Pt insisted to home at this time.  Pt left ED with daughter.

## 2014-06-24 NOTE — ED Provider Notes (Signed)
CSN: [id number]     Arrival date & time 06/23/14  1425 History   First MD Initiated Contact with Patient 06/23/14 1434     Chief Complaint  Patient presents with  . Alcohol Intoxication     (Consider location/radiation/quality/duration/timing/severity/associated sxs/prior Treatment) HPI Patient presents to the emergency department with alcohol abuse.  The patient is here for help with his alcohol.  Patient states that he drinks to try to help cure his pain that he has all over his body.  Patient states that all we give him as Tylenol for his pain.  States he has pain from head to toe.  Patient does not give a very many answers outside of this Past Medical History  Diagnosis Date  . 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 ETOH abuse   . Mental disorder   . Irregular heart beat   . Shortness of breath   . Headache(784.0)   . Hepatitis   . Alcohol withdrawal   . Neuropathy 06/07/2013  . 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 Seizure due to alcohol withdrawal 01/23/2014    Per patient report  . Anxiety Dx 2014  . GERD (gastroesophageal reflux disease) Dx 2003  . Hyperlipidemia Dx 2000  . Hypertension Dx 2011  . Withdrawal seizures 2014   Past Surgical History  Procedure Laterality Date  . Back surgery    . Neck surgery    . Fundoplasty transthoracic  2003  . Nasal sinus surgery    . Left heart catheterization with coronary angiogram N/A 01/13/2014    Procedure: LEFT HEART CATHETERIZATION WITH CORONARY ANGIOGRAM;  Surgeon: Clent Demark, MD;  Location: [facility] CATH LAB;  Service: Cardiovascular;  Laterality: N/A;   Family History  Problem Relation Age of Onset  . Breast cancer Paternal Grandmother   . Lung cancer      was a smoker  . Hypertension Mother    History  Substance Use Topics  . Smoking status: Current Every Day Smoker -- 0.50 packs/day for 30 years    Types: Cigarettes  . Smokeless tobacco: Never Used  . Alcohol Use: No   Comment: occasionally     Review of Systems  Level V caveat applies due to intoxication  Allergies  Lisinopril and Cozaar  Home Medications   Prior to Admission medications   Medication Sig Start Date End Date Taking? Authorizing Provider  acetaminophen-codeine (TYLENOL #3) 300-30 MG per tablet Take 1 tablet by mouth 2 (two) times daily as needed for moderate pain. 06/02/14   Josalyn Funches, MD  albuterol (PROVENTIL HFA;VENTOLIN HFA) 108 (90 BASE) MCG/ACT inhaler Inhale 2 puffs into the lungs every 4 (four) hours as needed for wheezing or shortness of breath. Patient not taking: Reported on 06/23/2014 02/07/14   Encarnacion Slates, NP  cyclobenzaprine (FLEXERIL) 10 MG tablet Take 1 tablet (10 mg total) by mouth 2 (two) times daily as needed for muscle spasms. 06/22/14   Gerda Diss, DO  esomeprazole (NEXIUM) 40 MG capsule Take 1 capsule (40 mg total) by mouth daily at 12 noon. Patient not taking: Reported on 06/23/2014 02/08/14   Tresa Garter, MD  gabapentin (NEURONTIN) 400 MG capsule Take 2 capsules (800 mg total) by mouth 3 (three) times daily. For substance withdrawal syndrome/paina management Patient not taking: Reported on 06/23/2014 02/08/14   Tresa Garter, MD  metoprolol (LOPRESSOR) 50 MG tablet Take 1 tablet (50 mg total) by mouth 2 (two) times  daily. For high blood pressure Patient not taking: Reported on 06/23/2014 02/08/14   Tresa Garter, MD   BP 176/115 mmHg  Pulse 97  Temp(Src) 97.6 F (36.4 C) (Oral)  Resp 20  SpO2 94% Physical Exam  Constitutional: He is oriented to person, place, and time. He appears well-developed and well-nourished. No distress.  HENT:  Head: Normocephalic and atraumatic.  Mouth/Throat: Oropharynx is clear and moist.  Eyes: Pupils are equal, round, and reactive to light.  Cardiovascular: Normal rate, regular rhythm and normal heart sounds.  Exam reveals no gallop and no friction rub.   No murmur heard. Pulmonary/Chest: Effort  normal and breath sounds normal. No respiratory distress.  Neurological: He is alert and oriented to person, place, and time. He exhibits normal muscle tone. Coordination normal.  Skin: Skin is warm and dry. No rash noted. No erythema.  Nursing note and vitals reviewed.   ED Course  Procedures (including critical care time) The patient apparently left before any testing was performed.  He did not want alcohol detox and was not suicidal    Dalia Heading, PA-C 06/24/14 [place], MD 07/07/14 [phone]

## 2014-06-29 ENCOUNTER — Other Ambulatory Visit: Payer: Self-pay | Admitting: Family Medicine

## 2014-06-29 NOTE — Telephone Encounter (Signed)
Patient called to request a med refill for acetaminophen-codeine (TYLENOL #3) 300-30 MG per tablet , please f/u with pt.

## 2014-07-04 ENCOUNTER — Ambulatory Visit (HOSPITAL_COMMUNITY)
Admission: RE | Admit: 2014-07-04 | Discharge: 2014-07-04 | Disposition: A | Discharge status: Home / Self Care | Payer: Self-pay | Source: Ambulatory Visit | Attending: Family Medicine | Admitting: Family Medicine

## 2014-07-04 ENCOUNTER — Telehealth: Payer: Self-pay | Admitting: *Deleted

## 2014-07-04 ENCOUNTER — Ambulatory Visit: Payer: Self-pay | Attending: Family Medicine | Admitting: Family Medicine

## 2014-07-04 ENCOUNTER — Encounter: Payer: Self-pay | Admitting: Family Medicine

## 2014-07-04 VITALS — BP 135/80 | HR 81 | Temp 98.4°F | Resp 16 | Ht 76.0 in | Wt 217.0 lb

## 2014-07-04 DIAGNOSIS — Z720 Tobacco use: Secondary | ICD-10-CM | POA: Insufficient documentation

## 2014-07-04 DIAGNOSIS — M545 Low back pain: Secondary | ICD-10-CM | POA: Insufficient documentation

## 2014-07-04 DIAGNOSIS — I10 Essential (primary) hypertension: Secondary | ICD-10-CM | POA: Insufficient documentation

## 2014-07-04 DIAGNOSIS — S62101A Fracture of unspecified carpal bone, right wrist, initial encounter for closed fracture: Secondary | ICD-10-CM | POA: Insufficient documentation

## 2014-07-04 DIAGNOSIS — S62308A Unspecified fracture of other metacarpal bone, initial encounter for closed fracture: Secondary | ICD-10-CM

## 2014-07-04 DIAGNOSIS — E559 Vitamin D deficiency, unspecified: Secondary | ICD-10-CM

## 2014-07-04 DIAGNOSIS — G8929 Other chronic pain: Secondary | ICD-10-CM | POA: Insufficient documentation

## 2014-07-04 DIAGNOSIS — W19XXXA Unspecified fall, initial encounter: Secondary | ICD-10-CM | POA: Insufficient documentation

## 2014-07-04 DIAGNOSIS — R5382 Chronic fatigue, unspecified: Secondary | ICD-10-CM | POA: Insufficient documentation

## 2014-07-04 DIAGNOSIS — R5383 Other fatigue: Secondary | ICD-10-CM | POA: Insufficient documentation

## 2014-07-04 DIAGNOSIS — M79641 Pain in right hand: Secondary | ICD-10-CM | POA: Insufficient documentation

## 2014-07-04 DIAGNOSIS — Z23 Encounter for immunization: Secondary | ICD-10-CM

## 2014-07-04 MED ORDER — ACETAMINOPHEN-CODEINE #3 300-30 MG PO TABS: 1.0000 | ORAL_TABLET | Freq: Two times a day (BID) | ORAL | Status: DC | PRN

## 2014-07-04 MED ORDER — METOPROLOL TARTRATE 50 MG PO TABS: 50.0000 mg | ORAL_TABLET | Freq: Two times a day (BID) | ORAL | Status: DC

## 2014-07-04 NOTE — Progress Notes (Signed)
Complaining of low energy and chronic pain  Stated injury on rt hand swelling x 1 wk

## 2014-07-04 NOTE — Patient Instructions (Signed)
Mark [name],  1. R hand pain following fall: Ice, elevation X-ray  2. Chronic pain: refilled tylenol #3  3. Fatigue with depression: I agree with [facility] You may also check you [name] Vit D and TSH check today  4. HTN: BP at goal. Metoprolol is working for you.  F/u in 3 months   Dr. Adrian Blackwater

## 2014-07-04 NOTE — Addendum Note (Signed)
Addended by: Boykin Nearing on: 07/04/2014 05:14 PM   Modules accepted: Orders

## 2014-07-04 NOTE — Progress Notes (Signed)
   Subjective:  CC: R hand pain, fatigue, HTN f/u      HPI  1. R lateral hand pain: since fall one week ago 06/26/13 or 06/27/13. Tripped over his dogs. Hit outstretched hand on wooden board. Has pain and bruising. Swelling and bruising improving.   2. Low energy: with depressed mood. Patient denies ETOH x one month. Patient going to [facility] tomorrow to re-establish care.   3. CHRONIC HYPERTENSION  Disease Monitoring  Blood pressure range: not check   Chest pain: no   Dyspnea: no   Claudication: no   Medication compliance: yes  Medication Side Effects  Lightheadedness: no   Urinary frequency: no   Edema: no    Soc Hx: current smoker  Review of Systems  Constitutional: Positive for fatigue. Negative for fever and chills.  Respiratory: Negative for chest tightness and shortness of breath.   Cardiovascular: Negative for chest pain.  Musculoskeletal: Positive for arthralgias.       Objective:   Physical Exam BP 135/80 mmHg  Pulse 81  Temp(Src) 98.4 F (36.9 C) (Oral)  Resp 16  Ht 6\' 4"  (1.93 m)  Wt 217 lb (98.431 kg)  BMI 26.43 kg/m2  SpO2 97% General appearance: alert, cooperative and no distress Lungs: normal WOB Skin: warm and dry Extremities: R lateral hand swollen with mild bruising on palmar surface. TTP 5th metacarpal.      Assessment & Plan:

## 2014-07-04 NOTE — Telephone Encounter (Signed)
Pt aware of results Will come to our clinic tomorrow for wrist splint

## 2014-07-04 NOTE — Assessment & Plan Note (Signed)
A: at goal Med: compliant with metoprolol P: continue current regimen

## 2014-07-04 NOTE — Assessment & Plan Note (Addendum)
1. R hand pain following fall: ? 5th metacarpal fracture  Ice, elevation X-ray of hand reveals fracture at base of 5th metacarpal likely extending into [facility]  Plan: Sports medicine referral,  urgent  X-ray of wrist ordered

## 2014-07-04 NOTE — Assessment & Plan Note (Signed)
Chronic pain: refilled tylenol #3  3. Fatigue with depression: I agree with [facility] You may also check you Mark [name] Vit D and TSH check today  4. HTN: BP at goal. Metoprolol is working for you.

## 2014-07-04 NOTE — Assessment & Plan Note (Signed)
A: fatigue in setting of untreated depression, hx of ETOH abuse, patient denies ongoing use P: B12 Vit D Mental health

## 2014-07-04 NOTE — Telephone Encounter (Signed)
-----   Message from Boykin Nearing, MD sent at 07/04/2014  5:11 PM EDT ----- Fracture in R hand at suspected area likely extending down into wrist joint Plan, Ortho referral, you can go back to sports medicine center since you were seen there as they have ortho there X-ray of wrist added

## 2014-07-05 ENCOUNTER — Telehealth: Payer: Self-pay | Admitting: Family Medicine

## 2014-07-05 ENCOUNTER — Ambulatory Visit (HOSPITAL_COMMUNITY)
Admission: RE | Admit: 2014-07-05 | Discharge: 2014-07-05 | Disposition: A | Discharge status: Home / Self Care | Payer: Self-pay | Source: Ambulatory Visit | Attending: Family Medicine | Admitting: Family Medicine

## 2014-07-05 ENCOUNTER — Telehealth: Payer: Self-pay | Admitting: *Deleted

## 2014-07-05 DIAGNOSIS — M79641 Pain in right hand: Secondary | ICD-10-CM

## 2014-07-05 DIAGNOSIS — E559 Vitamin D deficiency, unspecified: Secondary | ICD-10-CM | POA: Insufficient documentation

## 2014-07-05 DIAGNOSIS — S62141D Displaced fracture of body of hamate [unciform] bone, right wrist, subsequent encounter for fracture with routine healing: Secondary | ICD-10-CM | POA: Insufficient documentation

## 2014-07-05 DIAGNOSIS — S62316D Displaced fracture of base of fifth metacarpal bone, right hand, subsequent encounter for fracture with routine healing: Secondary | ICD-10-CM | POA: Insufficient documentation

## 2014-07-05 DIAGNOSIS — W19XXXD Unspecified fall, subsequent encounter: Secondary | ICD-10-CM | POA: Insufficient documentation

## 2014-07-05 HISTORY — DX: Vitamin D deficiency, unspecified: E55.9

## 2014-07-05 LAB — VITAMIN D 25 HYDROXY (VIT D DEFICIENCY, FRACTURES): Vit D, 25-Hydroxy: 21 ng/mL — ABNORMAL LOW (ref 30–100)

## 2014-07-05 LAB — TSH: TSH: 1.576 u[IU]/mL (ref 0.350–4.500)

## 2014-07-05 MED ORDER — VITAMIN D (ERGOCALCIFEROL) 1.25 MG (50000 UNIT) PO CAPS: 50000.0000 [IU] | ORAL_CAPSULE | ORAL | Status: DC

## 2014-07-05 NOTE — Telephone Encounter (Signed)
-----   Message from Boykin Nearing, MD sent at 07/05/2014  8:14 AM EDT ----- Vit D is low right on the border of deficiency/insufficiency, will replace with 50 K U weekly x 8 weeks then recommend 1000 IU of D3 daily.  Normal TSH

## 2014-07-05 NOTE — Telephone Encounter (Signed)
Tylenol #3 is the strongest I can prescribe Also recommend ice and elevation Sports medicine does do some casting, I spoke with Dr. Nori Riis who works at [facility] today. I have placed a referral. Patient should use splint and wait for referral.  If pain becomes unbearable he can also go to urgent care for evaluation and casting.

## 2014-07-05 NOTE — Addendum Note (Signed)
Addended by: Boykin Nearing on: 07/05/2014 08:16 AM   Modules accepted: Orders

## 2014-07-05 NOTE — Telephone Encounter (Signed)
Pt would like more information regarding next steps for broken hand. Please f/u with pt.

## 2014-07-05 NOTE — Telephone Encounter (Signed)
-----   Message from Boykin Nearing, MD sent at 07/05/2014  4:03 PM EDT ----- Fracture and hand does extend to wrist Referral to sports medicine placed.  Patient picked up splint today

## 2014-07-05 NOTE — Telephone Encounter (Signed)
Pt was given wrist splint

## 2014-07-05 NOTE — Addendum Note (Signed)
Addended by: Boykin Nearing on: 07/05/2014 03:28 PM   Modules accepted: Orders

## 2014-07-05 NOTE — Assessment & Plan Note (Signed)
Vit D is low right on the border of deficiency/insufficiency, will replace with 50 K U weekly x 8 weeks then recommend 1000 IU of D3 daily.  Normal TSH

## 2014-07-05 NOTE — Telephone Encounter (Signed)
Patient given results.  Patient says he called sports medicine and they told him they do not do casts and he is asking what he is supposed to do now to get a cast.  He is also saying he is in a lot of pain and the Tylenol 3 he takes already is not helping with the hand and wrist pain.  Please advise

## 2014-07-05 NOTE — Telephone Encounter (Signed)
Unable to LVM, no voice mail

## 2014-07-06 ENCOUNTER — Telehealth: Payer: Self-pay | Admitting: *Deleted

## 2014-07-06 NOTE — Telephone Encounter (Signed)
Gave patient Mark [name]' instructions regarding his broken wrist/hand-patient states he will go to the ED.

## 2014-07-07 ENCOUNTER — Emergency Department (HOSPITAL_COMMUNITY)
Admission: EM | Admit: 2014-07-07 | Discharge: 2014-07-07 | Disposition: A | Discharge status: Home / Self Care | Payer: Self-pay | Attending: Emergency Medicine | Admitting: Emergency Medicine

## 2014-07-07 ENCOUNTER — Encounter (HOSPITAL_COMMUNITY): Payer: Self-pay | Admitting: Emergency Medicine

## 2014-07-07 DIAGNOSIS — K219 Gastro-esophageal reflux disease without esophagitis: Secondary | ICD-10-CM | POA: Insufficient documentation

## 2014-07-07 DIAGNOSIS — S62346A Nondisplaced fracture of base of fifth metacarpal bone, right hand, initial encounter for closed fracture: Secondary | ICD-10-CM | POA: Insufficient documentation

## 2014-07-07 DIAGNOSIS — G629 Polyneuropathy, unspecified: Secondary | ICD-10-CM | POA: Insufficient documentation

## 2014-07-07 DIAGNOSIS — W010XXA Fall on same level from slipping, tripping and stumbling without subsequent striking against object, initial encounter: Secondary | ICD-10-CM | POA: Insufficient documentation

## 2014-07-07 DIAGNOSIS — Z8639 Personal history of other endocrine, nutritional and metabolic disease: Secondary | ICD-10-CM | POA: Insufficient documentation

## 2014-07-07 DIAGNOSIS — G40909 Epilepsy, unspecified, not intractable, without status epilepticus: Secondary | ICD-10-CM | POA: Insufficient documentation

## 2014-07-07 DIAGNOSIS — Z79899 Other long term (current) drug therapy: Secondary | ICD-10-CM | POA: Insufficient documentation

## 2014-07-07 DIAGNOSIS — F419 Anxiety disorder, unspecified: Secondary | ICD-10-CM | POA: Insufficient documentation

## 2014-07-07 DIAGNOSIS — S62306A Unspecified fracture of fifth metacarpal bone, right hand, initial encounter for closed fracture: Secondary | ICD-10-CM

## 2014-07-07 DIAGNOSIS — Z9889 Other specified postprocedural states: Secondary | ICD-10-CM | POA: Insufficient documentation

## 2014-07-07 DIAGNOSIS — Y998 Other external cause status: Secondary | ICD-10-CM | POA: Insufficient documentation

## 2014-07-07 DIAGNOSIS — I10 Essential (primary) hypertension: Secondary | ICD-10-CM | POA: Insufficient documentation

## 2014-07-07 DIAGNOSIS — Y9289 Other specified places as the place of occurrence of the external cause: Secondary | ICD-10-CM | POA: Insufficient documentation

## 2014-07-07 DIAGNOSIS — Y9389 Activity, other specified: Secondary | ICD-10-CM | POA: Insufficient documentation

## 2014-07-07 DIAGNOSIS — Z720 Tobacco use: Secondary | ICD-10-CM | POA: Insufficient documentation

## 2014-07-07 MED ORDER — KETOROLAC TROMETHAMINE 60 MG/2ML IM SOLN
60.0000 mg | Freq: Once | INTRAMUSCULAR | Status: AC
  Administered 2014-07-07: 60 mg via INTRAMUSCULAR
  Filled 2014-07-07: qty 2

## 2014-07-07 MED ORDER — DICLOFENAC SODIUM 75 MG PO TBEC: 75.0000 mg | DELAYED_RELEASE_TABLET | Freq: Two times a day (BID) | ORAL | Status: DC

## 2014-07-07 MED ORDER — HYDROCODONE-ACETAMINOPHEN 5-325 MG PO TABS: 2.0000 | ORAL_TABLET | ORAL | Status: DC | PRN

## 2014-07-07 NOTE — ED Notes (Signed)
Declined W/C at D/C and was escorted to lobby by RN. 

## 2014-07-07 NOTE — Discharge Instructions (Signed)
Hand Fracture, Fifth Metacarpal The small metacarpal is the bone at the base of the little finger between the knuckle and the wrist. A fracture is a break in that bone. One of the fractures that is common to this bone is called a Boxer's Fracture. TREATMENT These fractures can be treated with:   Reduction (bones moved back into place), then pinned through the skin to maintain the position, and then casted for about 6 weeks or as your caregiver determines necessary.  ORIF (open reduction and internal fixation) - the fracture site is opened and the bone pieces are fixed into place with pins and then casted for approximately 6 weeks or as your caregiver determines necessary. Your caregiver will discuss the type of fracture you have and the treatment that should be best for that problem. If surgery is the treatment of choice, the following is information for you to know, and also let your caregiver know about prior to surgery.  LET YOUR CAREGIVER KNOW ABOUT:  Allergies.  Medications taken including herbs, eye drops, over the counter medications, and creams.  Use of steroids (by mouth or creams).  Previous problems with anesthetics or novocaine.  Possibility of pregnancy, if this applies.  History of blood clots (thrombophlebitis).  History of bleeding or blood problems.  Previous surgery.  Other health problems. AFTER THE PROCEDURE After surgery, you will be taken to the recovery area where a nurse will watch and check your progress. Once you're awake, stable, and taking fluids well, barring other problems you'll be allowed to go home. Once home an ice pack applied to your operative site may help with discomfort and keep the swelling down. HOME CARE INSTRUCTIONS   Follow your caregiver's instructions as to activities, exercises, physical therapy, and driving a car.  Daily exercise is helpful for maintaining range of motion (movement and mobility) and strength. Exercise as  instructed.  To lessen swelling, keep the injured hand elevated above the level of your heart as much as possible.  Apply ice to the injury for 15-20 minutes each hour while awake for the first 2 days. Put the ice in a plastic bag and place a thin towel between the bag of ice and your cast.  Move the fingers of your casted hand at least several times a day.  If a plaster or fiberglass cast was applied:  Do not try to scratch the skin under the cast using a sharp or pointed object.  Check the skin around the cast every day. You may put lotion on red or sore areas.  Keep your cast dry. Your cast can be protected during bathing with a plastic bag. Do not put your cast into the water.  If a plaster splint was applied:  Wear the splint for as long as directed by your caregiver or until seen for follow-up examination.  Do not get your splint wet. Protect it during bathing with a plastic bag.  You may loosen the elastic bandage around the splint if your fingers start to get numb, tingle, get cold or turn blue.  Do not put pressure on your cast or splint; this may cause it to break. Especially, do not lean plaster casts on hard surfaces for 24 hours after application.  Take medications as directed by your caregiver.  Only take over-the-counter or prescription medicines for pain, discomfort, or fever as directed by your caregiver.  Follow all instructions for physician referrals, physical therapy, and rehabilitation. Any delay in obtaining necessary care could result in  permanent injury, disability and chronic pain. SEEK MEDICAL CARE IF:   Increased bleeding (more than a small spot) from the wound or from beneath your cast or splint if there is a wound beneath the cast from surgery.  Redness, swelling, or increasing pain in the wound or from beneath your cast or splint.  Pus coming from wound or from beneath your cast or splint.  An unexplained oral temperature above 102 F (38.9 C)  develops.  A foul smell coming from the wound or dressing or from beneath your cast or splint.  You are unable to move your little finger. SEEK IMMEDIATE MEDICAL CARE IF:  You develop a rash, have difficulty breathing, or have any allergy problems. If you do not have a window in your cast for observing the wound, a discharge or minor bleeding may show up as a stain on the outside of your cast. Report these findings to your caregiver. MAKE SURE YOU:   Understand these instructions.  Will watch your condition.  Will get help right away if you are not doing well or get worse. Document Released: 06/03/2000 Document Revised: 05/20/2011 Document Reviewed: 10/15/2007 [facility] Patient Information 2015 [place], [place]. This information is not intended to replace advice given to you by your health care provider. Make sure you discuss any questions you have with your health care provider.

## 2014-07-07 NOTE — ED Notes (Signed)
Pt c/o right hand injury and fracture [facility]; pt sent here for eval and cast per pt; pt sts severe pain

## 2014-07-07 NOTE — ED Provider Notes (Signed)
CSN: [id number]     Arrival date & time 07/07/14  1321 History  This chart was scribed for non-physician practitioner, Alyse Low, PA-C, working with Malvin Johns, MD, by Stephania Fragmin, ED Scribe. This patient was seen in room TR09C/TR09C and the patient's care was started at 2:07 PM.     Chief Complaint  Patient presents with  . Hand Injury   Patient is a 49 y.o. male presenting with hand injury. The history is provided by the patient and medical records. No language interpreter was used.  Hand Injury Location:  Hand Time since incident:  5 days Injury: yes   Mechanism of injury: fall   Fall:    Fall occurred:  Tripped   Impact surface: Nurse, learning disability, per medical records.   Point of impact:  Outstretched arms Hand location:  R hand Pain details:    Radiates to:  Does not radiate   Severity:  Severe   Onset quality:  Sudden   Duration:  5 days   Timing:  Constant Chronicity:  New Handedness:  Right-handed Dislocation: no   Foreign body present:  No foreign bodies Relieved by:  Nothing Worsened by:  Nothing tried Ineffective treatments:  None tried Risk factors: no concern for non-accidental trauma      HPI Comments: Mark [name] is a 49 y.o. male who is right-hand dominant and presents to the Emergency Department complaining of severe, constant right hand pain after a fall that occurred 5 days ago. He was evaluated at [facility] [place], where an XR showed fractures, and was given a splint and referred to an orthopedist's office for followup. Patient states he has not been able to put the splint on himself, and has also been unable to reach the orthopedist's office. When he finally called them, they referred him here. He denies any chronic medical problems. Patient has known allergies to lisinopril and Cozaar.  Past Medical History  Diagnosis Date  . 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 ETOH abuse   . Mental disorder   .  Irregular heart beat   . Shortness of breath   . Headache(784.0)   . Hepatitis   . Alcohol withdrawal   . Neuropathy 06/07/2013  . 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 Seizure due to alcohol withdrawal 01/23/2014    Per patient report  . Anxiety Dx 2014  . GERD (gastroesophageal reflux disease) Dx 2003  . Hyperlipidemia Dx 2000  . Hypertension Dx 2011  . Withdrawal seizures 2014   Past Surgical History  Procedure Laterality Date  . Back surgery    . Neck surgery    . Fundoplasty transthoracic  2003  . Nasal sinus surgery    . Left heart catheterization with coronary angiogram N/A 01/13/2014    Procedure: LEFT HEART CATHETERIZATION WITH CORONARY ANGIOGRAM;  Surgeon: Clent Demark, MD;  Location: [facility] CATH LAB;  Service: Cardiovascular;  Laterality: N/A;   Family History  Problem Relation Age of Onset  . Breast cancer Paternal Grandmother   . Lung cancer      was a smoker  . Hypertension Mother    History  Substance Use Topics  . Smoking status: Current Every Day Smoker -- 0.50 packs/day for 30 years    Types: Cigarettes  . Smokeless tobacco: Never Used  . Alcohol Use: No     Comment: occasionally     Review of Systems  Musculoskeletal: Positive for myalgias.  All other systems reviewed and are negative.   Allergies  Lisinopril and Cozaar  Home Medications   Prior to Admission medications   Medication Sig Start Date End Date Taking? Authorizing Provider  acetaminophen-codeine (TYLENOL #3) 300-30 MG per tablet Take 1 tablet by mouth 2 (two) times daily as needed for moderate pain. 07/04/14   Josalyn Funches, MD  albuterol (PROVENTIL HFA;VENTOLIN HFA) 108 (90 BASE) MCG/ACT inhaler Inhale 2 puffs into the lungs every 4 (four) hours as needed for wheezing or shortness of breath. 02/07/14   Encarnacion Slates, NP  cyclobenzaprine (FLEXERIL) 10 MG tablet Take 1 tablet (10 mg total) by mouth 2 (two) times daily as needed for muscle spasms. 06/22/14   Gerda Diss, DO  esomeprazole  (NEXIUM) 40 MG capsule Take 1 capsule (40 mg total) by mouth daily at 12 noon. 02/08/14   Tresa Garter, MD  gabapentin (NEURONTIN) 400 MG capsule Take 2 capsules (800 mg total) by mouth 3 (three) times daily. For substance withdrawal syndrome/paina management 02/08/14   Tresa Garter, MD  metoprolol (LOPRESSOR) 50 MG tablet Take 1 tablet (50 mg total) by mouth 2 (two) times daily. 07/04/14   Josalyn Funches, MD  Vitamin D, Ergocalciferol, (DRISDOL) 50000 UNITS CAPS capsule Take 1 capsule (50,000 Units total) by mouth every 7 (seven) days. For 8 weeks 07/05/14   Josalyn Funches, MD   BP 145/97 mmHg  Pulse 87  Temp(Src) 98.1 F (36.7 C) (Oral)  Resp 18  SpO2 95% Physical Exam  Constitutional: He is oriented to person, place, and time. He appears well-developed and well-nourished. No distress.  HENT:  Head: Normocephalic and atraumatic.  Eyes: Conjunctivae and EOM are normal.  Neck: Neck supple. No tracheal deviation present.  Cardiovascular: Normal rate.   Pulmonary/Chest: Effort normal. No respiratory distress.  Musculoskeletal: Normal range of motion. He exhibits edema and tenderness.  Swollen right hand. Tender. Bruised. Cap refill <2 seconds.  Neurological: He is alert and oriented to person, place, and time.  Skin: Skin is warm and dry.  Psychiatric: He has a normal mood and affect. His behavior is normal.  Nursing note and vitals reviewed.   ED Course  Procedures (including critical care time)  DIAGNOSTIC STUDIES: Oxygen Saturation is 95% on RA, normal by my interpretation.    COORDINATION OF CARE: 2:11 PM - Discussed treatment plan with pt at bedside which includes splint application and f/u with orthopedist, and pt agreed to plan.  Imaging Review Dg Wrist Complete Right  07/05/2014   CLINICAL DATA:  Right hand fracture status post fall on 04/18-4/19  EXAM: RIGHT WRIST - COMPLETE 3+ VIEW  COMPARISON:  07/04/2014  FINDINGS: There is a comminuted fracture at the base  of the fifth metacarpal extending to the articular surface with mild displacement. There is a nondisplaced fracture of the distal dorsal peripheral aspect of the hamate.  There is no other fracture or dislocation.  IMPRESSION: There is a comminuted fracture at the base of the fifth metacarpal extending to the articular surface with mild displacement.  There is a nondisplaced fracture of the distal dorsal peripheral aspect of the hamate.   Electronically Signed   By: Kathreen Devoid   On: 07/05/2014 15:57    MDM   Final diagnoses:  Fracture of fifth metacarpal bone of right hand, closed, initial encounter     ulner gutter splint Hydrocodone 10 tablets Follow up with sports medicine  I personally performed the services in this documentation, which was scribed  in my presence.  The recorded information has been reviewed and considered.   Ronnald Collum.  Fransico Meadow, PA-C 07/07/14 [place], [place] 07/07/14 1535  Malvin Johns, MD 07/07/14 [phone]

## 2014-07-07 NOTE — ED Notes (Addendum)
Called ortho to come place ulnar gutter.  Ortho on way to place.

## 2014-07-07 NOTE — Progress Notes (Signed)
Orthopedic Tech Progress Note Patient Details:  Mark [name] 21-Feb-1966 [id number]  Ortho Devices Type of Ortho Device: Ace wrap, Arm sling, Ulna gutter splint Ortho Device/Splint Location: RUE Ortho Device/Splint Interventions: Application   [name] 07/07/2014, 2:58 PM

## 2014-07-07 NOTE — ED Notes (Signed)
Ortho at bedside to place splint.

## 2014-07-11 ENCOUNTER — Other Ambulatory Visit: Payer: Self-pay | Admitting: Internal Medicine

## 2014-07-13 ENCOUNTER — Telehealth: Payer: Self-pay | Admitting: General Practice

## 2014-07-13 ENCOUNTER — Ambulatory Visit: Payer: Self-pay | Attending: Family Medicine

## 2014-07-13 DIAGNOSIS — G894 Chronic pain syndrome: Secondary | ICD-10-CM

## 2014-07-13 NOTE — Telephone Encounter (Signed)
Patient presents to clinic to follow up on his referrals. Patient states that at the time that they were placed he did not have any insurance. Patient now has the orange card. Please assist accordingly.

## 2014-07-13 NOTE — Telephone Encounter (Signed)
PT and pain management referral placed

## 2014-07-20 ENCOUNTER — Ambulatory Visit: Payer: No Typology Code available for payment source | Attending: Sports Medicine

## 2014-07-20 ENCOUNTER — Ambulatory Visit: Payer: Self-pay | Admitting: Sports Medicine

## 2014-07-20 DIAGNOSIS — G894 Chronic pain syndrome: Secondary | ICD-10-CM | POA: Insufficient documentation

## 2014-07-22 ENCOUNTER — Emergency Department (HOSPITAL_COMMUNITY): Payer: Self-pay

## 2014-07-22 ENCOUNTER — Emergency Department (HOSPITAL_COMMUNITY)
Admission: EM | Admit: 2014-07-22 | Discharge: 2014-07-22 | Disposition: A | Discharge status: Home / Self Care | Payer: Self-pay | Attending: Emergency Medicine | Admitting: Emergency Medicine

## 2014-07-22 ENCOUNTER — Encounter (HOSPITAL_COMMUNITY): Payer: Self-pay | Admitting: Family Medicine

## 2014-07-22 DIAGNOSIS — J069 Acute upper respiratory infection, unspecified: Secondary | ICD-10-CM | POA: Insufficient documentation

## 2014-07-22 DIAGNOSIS — Z79899 Other long term (current) drug therapy: Secondary | ICD-10-CM | POA: Insufficient documentation

## 2014-07-22 DIAGNOSIS — J441 Chronic obstructive pulmonary disease with (acute) exacerbation: Secondary | ICD-10-CM | POA: Insufficient documentation

## 2014-07-22 DIAGNOSIS — Z8639 Personal history of other endocrine, nutritional and metabolic disease: Secondary | ICD-10-CM | POA: Insufficient documentation

## 2014-07-22 DIAGNOSIS — R062 Wheezing: Secondary | ICD-10-CM

## 2014-07-22 DIAGNOSIS — G629 Polyneuropathy, unspecified: Secondary | ICD-10-CM | POA: Insufficient documentation

## 2014-07-22 DIAGNOSIS — K219 Gastro-esophageal reflux disease without esophagitis: Secondary | ICD-10-CM | POA: Insufficient documentation

## 2014-07-22 DIAGNOSIS — Z791 Long term (current) use of non-steroidal anti-inflammatories (NSAID): Secondary | ICD-10-CM | POA: Insufficient documentation

## 2014-07-22 DIAGNOSIS — I10 Essential (primary) hypertension: Secondary | ICD-10-CM | POA: Insufficient documentation

## 2014-07-22 DIAGNOSIS — R059 Cough, unspecified: Secondary | ICD-10-CM

## 2014-07-22 DIAGNOSIS — Z8659 Personal history of other mental and behavioral disorders: Secondary | ICD-10-CM | POA: Insufficient documentation

## 2014-07-22 DIAGNOSIS — R05 Cough: Secondary | ICD-10-CM

## 2014-07-22 DIAGNOSIS — R0602 Shortness of breath: Secondary | ICD-10-CM

## 2014-07-22 DIAGNOSIS — Z720 Tobacco use: Secondary | ICD-10-CM | POA: Insufficient documentation

## 2014-07-22 LAB — BASIC METABOLIC PANEL
Anion gap: 15 (ref 5–15)
BUN: <5 mg/dL — ABNORMAL LOW (ref 6–20)
CO2: 26 mmol/L (ref 22–32)
Calcium: 8.8 mg/dL — ABNORMAL LOW (ref 8.9–10.3)
Chloride: 96 mmol/L — ABNORMAL LOW (ref 101–111)
Creatinine, Ser: 1 mg/dL (ref 0.61–1.24)
GFR calc Af Amer: >60 mL/min (ref >60)
GFR calc non Af Amer: >60 mL/min (ref >60)
Glucose, Bld: 127 mg/dL — ABNORMAL HIGH (ref 65–99)
Potassium: 3.1 mmol/L — ABNORMAL LOW (ref 3.5–5.1)
Sodium: 137 mmol/L (ref 135–145)

## 2014-07-22 LAB — HEPATIC FUNCTION PANEL
ALBUMIN: 3.8 g/dL (ref 3.5–5.0)
ALK PHOS: 120 U/L (ref 38–126)
ALT: 65 U/L — ABNORMAL HIGH (ref 17–63)
AST: 53 U/L — AB (ref 15–41)
Bilirubin, Direct: 0.1 mg/dL (ref 0.1–0.5)
Indirect Bilirubin: 0.5 mg/dL (ref 0.3–0.9)
TOTAL PROTEIN: 6.5 g/dL (ref 6.5–8.1)
Total Bilirubin: 0.6 mg/dL (ref 0.3–1.2)

## 2014-07-22 LAB — CBC
HCT: 43.6 % (ref 39.0–52.0)
HEMOGLOBIN: 15 g/dL (ref 13.0–17.0)
MCH: 27.8 pg (ref 26.0–34.0)
MCHC: 34.4 g/dL (ref 30.0–36.0)
MCV: 80.9 fL (ref 78.0–100.0)
PLATELETS: 280 10*3/uL (ref 150–400)
RBC: 5.39 MIL/uL (ref 4.22–5.81)
RDW: 13.8 % (ref 11.5–15.5)
WBC: 6.2 10*3/uL (ref 4.0–10.5)

## 2014-07-22 LAB — LIPASE, BLOOD: LIPASE: 22 U/L (ref 22–51)

## 2014-07-22 LAB — I-STAT TROPONIN, ED: Troponin i, poc: 0.01 ng/mL (ref 0.00–0.08)

## 2014-07-22 MED ORDER — KETOROLAC TROMETHAMINE 30 MG/ML IJ SOLN
30.0000 mg | Freq: Once | INTRAMUSCULAR | Status: AC
  Administered 2014-07-22: 30 mg via INTRAVENOUS
  Filled 2014-07-22: qty 1

## 2014-07-22 MED ORDER — IPRATROPIUM-ALBUTEROL 0.5-2.5 (3) MG/3ML IN SOLN
3.0000 mL | Freq: Once | RESPIRATORY_TRACT | Status: AC
  Administered 2014-07-22: 3 mL via RESPIRATORY_TRACT
  Filled 2014-07-22: qty 3

## 2014-07-22 MED ORDER — POTASSIUM CHLORIDE CRYS ER 20 MEQ PO TBCR
40.0000 meq | EXTENDED_RELEASE_TABLET | Freq: Once | ORAL | Status: AC
  Administered 2014-07-22: 40 meq via ORAL
  Filled 2014-07-22: qty 2

## 2014-07-22 MED ORDER — AZITHROMYCIN 250 MG PO TABS: 250.0000 mg | ORAL_TABLET | Freq: Every day | ORAL | Status: DC

## 2014-07-22 MED ORDER — METHYLPREDNISOLONE SODIUM SUCC 125 MG IJ SOLR
125.0000 mg | Freq: Once | INTRAMUSCULAR | Status: AC
  Administered 2014-07-22: 125 mg via INTRAVENOUS
  Filled 2014-07-22: qty 2

## 2014-07-22 MED ORDER — METOCLOPRAMIDE HCL 5 MG/ML IJ SOLN
10.0000 mg | Freq: Once | INTRAMUSCULAR | Status: AC
  Administered 2014-07-22: 10 mg via INTRAVENOUS
  Filled 2014-07-22: qty 2

## 2014-07-22 MED ORDER — PREDNISONE 20 MG PO TABS: 40.0000 mg | ORAL_TABLET | Freq: Every day | ORAL | Status: DC

## 2014-07-22 MED ORDER — ALBUTEROL SULFATE (2.5 MG/3ML) 0.083% IN NEBU
5.0000 mg | INHALATION_SOLUTION | Freq: Once | RESPIRATORY_TRACT | Status: AC
  Administered 2014-07-22: 5 mg via RESPIRATORY_TRACT
  Filled 2014-07-22: qty 6

## 2014-07-22 MED ORDER — DIPHENHYDRAMINE HCL 50 MG/ML IJ SOLN
25.0000 mg | Freq: Once | INTRAMUSCULAR | Status: AC
  Administered 2014-07-22: 25 mg via INTRAVENOUS
  Filled 2014-07-22: qty 1

## 2014-07-22 MED ORDER — ALBUTEROL SULFATE HFA 108 (90 BASE) MCG/ACT IN AERS
2.0000 | INHALATION_SPRAY | RESPIRATORY_TRACT | Status: DC | PRN
  Administered 2014-07-22: 2 via RESPIRATORY_TRACT
  Filled 2014-07-22: qty 6.7

## 2014-07-22 MED ORDER — IPRATROPIUM BROMIDE 0.02 % IN SOLN
0.5000 mg | Freq: Once | RESPIRATORY_TRACT | Status: AC
  Administered 2014-07-22: 0.5 mg via RESPIRATORY_TRACT
  Filled 2014-07-22: qty 2.5

## 2014-07-22 MED ORDER — HYDROCODONE-HOMATROPINE 5-1.5 MG/5ML PO SYRP: 5.0000 mL | ORAL_SOLUTION | Freq: Four times a day (QID) | ORAL | Status: DC | PRN

## 2014-07-22 MED ORDER — ALBUTEROL (5 MG/ML) CONTINUOUS INHALATION SOLN
10.0000 mg/h | INHALATION_SOLUTION | RESPIRATORY_TRACT | Status: AC
  Administered 2014-07-22: 10 mg/h via RESPIRATORY_TRACT
  Filled 2014-07-22: qty 20

## 2014-07-22 NOTE — ED Provider Notes (Signed)
CSN: [id number]     Arrival date & time 07/22/14  0830 History   First MD Initiated Contact with Patient 07/22/14 [phone]     Chief Complaint  Patient presents with  . Cough  . Shortness of Breath     (Consider location/radiation/quality/duration/timing/severity/associated sxs/prior Treatment) HPI Comments: Patient presents to the emergency department with chief complaints of multiple complaints. He complains of cough, congestion, shortness of breath 3 days. Reports associated shortness breath, body aches, and chest pain. States that the chest pain is because of the coughing and the tightness in his chest. He has a history of COPD. He has not taken anything to alleviate his symptoms. He reports running a subjective fever, but never measured his temperature. He is afebrile in the emergency department. He denies any nausea, vomiting, diarrhea, or constipation. Additionally, he complains of a headache times. States that he has a history of migraine headaches. States that the headache is mostly located in the occipital region, but he feels a stabbing sensation behind his eyes. He denies any vision changes. Headache was not sudden in onset, but has been persistent.  The history is provided by the patient. No language interpreter was used.    Past Medical History  Diagnosis Date  . 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 ETOH abuse   . Mental disorder   . Irregular heart beat   . Shortness of breath   . Headache(784.0)   . Hepatitis   . Alcohol withdrawal   . Neuropathy 06/07/2013  . 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 Seizure due to alcohol withdrawal 01/23/2014    Per patient report  . Anxiety Dx 2014  . GERD (gastroesophageal reflux disease) Dx 2003  . Hyperlipidemia Dx 2000  . Hypertension Dx 2011  . Withdrawal seizures 2014   Past Surgical History  Procedure Laterality Date  . Back surgery    . Neck surgery    . Fundoplasty transthoracic  2003   . Nasal sinus surgery    . Left heart catheterization with coronary angiogram N/A 01/13/2014    Procedure: LEFT HEART CATHETERIZATION WITH CORONARY ANGIOGRAM;  Surgeon: Clent Demark, MD;  [facility] CATH LAB;  Service: Cardiovascular;  Laterality: N/A;   Family History  Problem Relation Age of Onset  . Breast cancer Paternal Grandmother   . Lung cancer      was a smoker  . Hypertension Mother    History  Substance Use Topics  . Smoking status: Current Every Day Smoker -- 0.50 packs/day for 30 years    Types: Cigarettes  . Smokeless tobacco: Never Used  . Alcohol Use: Yes     Comment: occasionally     Review of Systems  Constitutional: Positive for fever and chills.  HENT: Positive for postnasal drip, rhinorrhea, sinus pressure, sneezing and sore throat.   Respiratory: Positive for cough. Negative for shortness of breath.   Cardiovascular: Negative for chest pain.  Gastrointestinal: Negative for nausea, vomiting, abdominal pain, diarrhea and constipation.  Genitourinary: Negative for dysuria.  Neurological: Positive for headaches.  All other systems reviewed and are negative.     Allergies  Lisinopril and Cozaar  Home Medications   Prior to Admission medications   Medication Sig Start Date End Date Taking? Authorizing Provider  acetaminophen-codeine (TYLENOL #3) 300-30 MG per tablet Take 1 tablet by mouth 2 (two) times daily as needed for moderate pain. 07/04/14   Boykin Nearing, MD  albuterol (PROVENTIL  HFA;VENTOLIN HFA) 108 (90 BASE) MCG/ACT inhaler Inhale 2 puffs into the lungs every 4 (four) hours as needed for wheezing or shortness of breath. 02/07/14   Encarnacion Slates, NP  cyclobenzaprine (FLEXERIL) 10 MG tablet Take 1 tablet (10 mg total) by mouth 2 (two) times daily as needed for muscle spasms. 06/22/14   Gerda Diss, DO  diclofenac (VOLTAREN) 75 MG EC tablet Take 1 tablet (75 mg total) by mouth 2 (two) times daily. 07/07/14   Fransico Meadow, PA-C   esomeprazole (NEXIUM) 40 MG capsule Take 1 capsule (40 mg total) by mouth daily at 12 noon. 02/08/14   Tresa Garter, MD  gabapentin (NEURONTIN) 400 MG capsule Take 2 capsules (800 mg total) by mouth 3 (three) times daily. For substance withdrawal syndrome/paina management 02/08/14   Tresa Garter, MD  HYDROcodone-acetaminophen (NORCO/VICODIN) 5-325 MG per tablet Take 2 tablets by mouth every 4 (four) hours as needed. 07/07/14   Fransico Meadow, PA-C  metoprolol (LOPRESSOR) 50 MG tablet Take 1 tablet (50 mg total) by mouth 2 (two) times daily. 07/04/14   Josalyn Funches, MD  Vitamin D, Ergocalciferol, (DRISDOL) 50000 UNITS CAPS capsule Take 1 capsule (50,000 Units total) by mouth every 7 (seven) days. For 8 weeks 07/05/14   Josalyn Funches, MD   BP 147/118 mmHg  Pulse 84  Temp(Src) 98.7 F (37.1 C)  Resp 20  SpO2 96% Physical Exam  Constitutional: He is oriented to person, place, and time. He appears well-developed and well-nourished. No distress.  HENT:  Head: Normocephalic and atraumatic.  Right Ear: External ear normal.  Left Ear: External ear normal.  Mildly erythematous, no tonsillar exudate, no abscess, no stridor, uvula is midline  TMs clear bilaterally  Eyes: Conjunctivae and EOM are normal. Pupils are equal, round, and reactive to light. Right eye exhibits no discharge. Left eye exhibits no discharge. No scleral icterus.  Neck: Normal range of motion. Neck supple. No JVD present.  No pain with neck flexion, no meningismus  Cardiovascular: Normal rate, regular rhythm and normal heart sounds.  Exam reveals no gallop and no friction rub.   No murmur heard. Pulmonary/Chest: Effort normal. No stridor. No respiratory distress. He has wheezes. He has no rales. He exhibits no tenderness.  Bilateral wheezes  Abdominal: Soft. Bowel sounds are normal. He exhibits no distension and no mass. There is no tenderness. There is no rebound and no guarding.  Musculoskeletal: Normal range  of motion. He exhibits no edema or tenderness.  Normal gait.  Neurological: He is alert and oriented to person, place, and time. He has normal reflexes.  CN 3-12 intact, normal finger to nose, no pronator drift, sensation and strength intact bilaterally.  Skin: Skin is warm and dry. No rash noted. He is not diaphoretic.  Psychiatric: He has a normal mood and affect. His behavior is normal. Judgment and thought content normal.  Nursing note and vitals reviewed.   ED Course  Procedures (including critical care time) Results for orders placed or performed during the hospital encounter of 07/22/14  CBC  Result Value Ref Range   WBC 6.2 4.0 - 10.5 K/uL   RBC 5.39 4.22 - 5.81 MIL/uL   Hemoglobin 15.0 13.0 - 17.0 g/dL   HCT 43.6 39.0 - 52.0 %   MCV 80.9 78.0 - 100.0 fL   MCH 27.8 26.0 - 34.0 pg   MCHC 34.4 30.0 - 36.0 g/dL   RDW 13.8 11.5 - 15.5 %   Platelets 280 150 -  400 K/uL  Basic metabolic panel  Result Value Ref Range   Sodium 137 135 - 145 mmol/L   Potassium 3.1 (L) 3.5 - 5.1 mmol/L   Chloride 96 (L) 101 - 111 mmol/L   CO2 26 22 - 32 mmol/L   Glucose, Bld 127 (H) 65 - 99 mg/dL   BUN <5 (L) 6 - 20 mg/dL   Creatinine, Ser 1.00 0.61 - 1.24 mg/dL   Calcium 8.8 (L) 8.9 - 10.3 mg/dL   GFR calc non Af Amer >60 >60 mL/min   GFR calc Af Amer >60 >60 mL/min   Anion gap 15 5 - 15  Hepatic function panel  Result Value Ref Range   Total Protein 6.5 6.5 - 8.1 g/dL   Albumin 3.8 3.5 - 5.0 g/dL   AST 53 (H) 15 - 41 U/L   ALT 65 (H) 17 - 63 U/L   Alkaline Phosphatase 120 38 - 126 U/L   Total Bilirubin 0.6 0.3 - 1.2 mg/dL   Bilirubin, Direct 0.1 0.1 - 0.5 mg/dL   Indirect Bilirubin 0.5 0.3 - 0.9 mg/dL  Lipase, blood  Result Value Ref Range   Lipase 22 22 - 51 U/L  I-stat troponin, ED  (not at [facility])  Result Value Ref Range   Troponin i, poc 0.01 0.00 - 0.08 ng/mL   Comment 3           Dg Chest 2 View  07/22/2014   CLINICAL DATA:  Body aches, unable to catch breath for 3-4  days, cough beginning 3-4 weeks ago, history of drug and ethanol abuse, hyperlipidemia, hypertension, dilated cardiomyopathy, smoking  EXAM: CHEST  2 VIEW  COMPARISON:  04/12/2014  FINDINGS: Normal heart size, mediastinal contours, and pulmonary vascularity.  Lungs clear.  No pleural effusion or pneumothorax.  Prior cervicothoracic fusion.  IMPRESSION: No acute abnormalities.   Electronically Signed   By: Lavonia Dana M.D.   On: 07/22/2014 09:02   Dg Wrist Complete Right  07/05/2014   CLINICAL DATA:  Right hand fracture status post fall on 04/18-4/19  EXAM: RIGHT WRIST - COMPLETE 3+ VIEW  COMPARISON:  07/04/2014  FINDINGS: There is a comminuted fracture at the base of the fifth metacarpal extending to the articular surface with mild displacement. There is a nondisplaced fracture of the distal dorsal peripheral aspect of the hamate.  There is no other fracture or dislocation.  IMPRESSION: There is a comminuted fracture at the base of the fifth metacarpal extending to the articular surface with mild displacement.  There is a nondisplaced fracture of the distal dorsal peripheral aspect of the hamate.   Electronically Signed   By: Kathreen Devoid   On: 07/05/2014 15:57   Dg Hand Complete Right  07/04/2014   CLINICAL DATA:  Right lateral hand pain since fall 1 week ago. Initial encounter.  EXAM: RIGHT HAND - COMPLETE 3+ VIEW  COMPARISON:  None.  FINDINGS: There is a minimally displaced slightly comminuted fracture involving the base of the fifth metacarpal with associated minimal foreshortening and dorsal angulation. There is suspected extension of the fracture to the adjacent [facility] joint. Expected adjacent soft tissue swelling. No radiopaque foreign body.  No additional fractures identified. Remaining joint spaces appear preserved. No erosions. No evidence of chondrocalcinosis.  IMPRESSION: Minimally displaced and slightly comminuted fracture involving the base of the fifth neck carpal with suspected extension to the  adjacent [facility] joint.   Electronically Signed   By: Sandi Mariscal M.D.   On: 07/04/2014  16:17     Imaging Review Dg Chest 2 View  07/22/2014   CLINICAL DATA:  Body aches, unable to catch breath for 3-4 days, cough beginning 3-4 weeks ago, history of drug and ethanol abuse, hyperlipidemia, hypertension, dilated cardiomyopathy, smoking  EXAM: CHEST  2 VIEW  COMPARISON:  04/12/2014  FINDINGS: Normal heart size, mediastinal contours, and pulmonary vascularity.  Lungs clear.  No pleural effusion or pneumothorax.  Prior cervicothoracic fusion.  IMPRESSION: No acute abnormalities.   Electronically Signed   By: Lavonia Dana M.D.   On: 07/22/2014 09:02     EKG Interpretation   Date/Time:  Friday Jul 22 2014 08:36:12 EDT Ventricular Rate:  89 PR Interval:  154 QRS Duration: 112 QT Interval:  386 QTC Calculation: 470 R Axis:   69 Text Interpretation:  Sinus rhythm Multiple ventricular premature  complexes Borderline intraventricular conduction delay ST elev, probable  normal early repol pattern Baseline wander in lead(s) V2 V6 No significant  change since last tracing Confirmed by POLLINA  MD, CHRISTOPHER [phone]) on  07/22/2014 8:41:06 AM      MDM   Final diagnoses:  Wheezing  COPD exacerbation  SOB (shortness of breath)  URI (upper respiratory infection)    Patient with cough, congestion, body aches, and chest tightness. Chest x-ray is negative for pneumonia. He does have bilateral wheezes. Will give breathing treatment. Could be COPD exacerbation. Additionally, patient has had a headache for the past 3 weeks. Will give headache cocktail, and will reassess.  10:26 AM Patient states his headache has significantly improved. He is still very wheezy on exam following breathing treatment. Will give an additional breathing treatment and will reassess.  11:41 AM Reassessed, still very wheezy, still having some increased work of breathing. Will order continuous albuterol treatment. Will reassess  after the hour-long treatment.  1:59 PM Moderately improved after continuous nebulizer. Ambulates maintaining greater than 97% oxygenation. Plan for discharge to home with cough medicine, prednisone, and a Z-Pak because of his COPD. Return precautions discussed. Patient understands and agrees with the plan. He feels well enough for discharge.    Montine Circle, PA-C 07/22/14 [place], [place] 07/22/14 1407

## 2014-07-22 NOTE — ED Notes (Signed)
p here for 3 days of cough, SOB, body aches and chest pain.

## 2014-07-22 NOTE — Discharge Instructions (Signed)
Chronic Obstructive Pulmonary Disease  Chronic obstructive pulmonary disease (COPD) is a common lung condition in which airflow from the lungs is limited. COPD is a general term that can be used to describe many different lung problems that limit airflow, including both chronic bronchitis and emphysema. If you have COPD, your lung function will probably never return to normal, but there are measures you can take to improve lung function and make yourself feel better.   CAUSES    Smoking (common).    Exposure to secondhand smoke.    Genetic problems.   Chronic inflammatory lung diseases or recurrent infections.  SYMPTOMS    Shortness of breath, especially with physical activity.    Deep, persistent (chronic) cough with a large amount of thick mucus.    Wheezing.    Rapid breaths (tachypnea).    Gray or bluish discoloration (cyanosis) of the skin, especially in fingers, toes, or lips.    Fatigue.    Weight loss.    Frequent infections or episodes when breathing symptoms become much worse (exacerbations).    Chest tightness.  DIAGNOSIS   Your health care provider will take a medical history and perform a physical examination to make the initial diagnosis. Additional tests for COPD may include:    Lung (pulmonary) function tests.   Chest X-ray.   CT scan.   Blood tests.  TREATMENT   Treatment available to help you feel better when you have COPD includes:    Inhaler and nebulizer medicines. These help manage the symptoms of COPD and make your breathing more comfortable.   Supplemental oxygen. Supplemental oxygen is only helpful if you have a low oxygen level in your blood.    Exercise and physical activity. These are beneficial for nearly all people with COPD. Some people may also benefit from a pulmonary rehabilitation program.  HOME CARE INSTRUCTIONS    Take all medicines (inhaled or pills) as directed by your health care provider.   Avoid over-the-counter medicines or cough syrups  that dry up your airway (such as antihistamines) and slow down the elimination of secretions unless instructed otherwise by your health care provider.    If you are a smoker, the most important thing that you can do is stop smoking. Continuing to smoke will cause further lung damage and breathing trouble. Ask your health care provider for help with quitting smoking. He or she can direct you to community resources or hospitals that provide support.   Avoid exposure to irritants such as smoke, chemicals, and fumes that aggravate your breathing.   Use oxygen therapy and pulmonary rehabilitation if directed by your health care provider. If you require home oxygen therapy, ask your health care provider whether you should purchase a pulse oximeter to measure your oxygen level at home.    Avoid contact with individuals who have a contagious illness.   Avoid extreme temperature and humidity changes.   Eat healthy foods. Eating smaller, more frequent meals and resting before meals may help you maintain your strength.   Stay active, but balance activity with periods of rest. Exercise and physical activity will help you maintain your ability to do things you want to do.   Preventing infection and hospitalization is very important when you have COPD. Make sure to receive all the vaccines your health care provider recommends, especially the pneumococcal and influenza vaccines. Ask your health care provider whether you need a pneumonia vaccine.   Learn and use relaxation techniques to manage stress.   Learn   going to whistle and breathe out (exhale) through the pursed lips for 2 seconds.   Diaphragmatic breathing. Start by putting one hand on your abdomen just above  your waist. Inhale slowly through your nose. The hand on your abdomen should move out. Then purse your lips and exhale slowly. You should be able to feel the hand on your abdomen moving in as you exhale.   Learn and use controlled coughing to clear mucus from your lungs. Controlled coughing is a series of short, progressive coughs. The steps of controlled coughing are:  1. Lean your head slightly forward.  2. Breathe in deeply using diaphragmatic breathing.  3. Try to hold your breath for 3 seconds.  4. Keep your mouth slightly open while coughing twice.  5. Spit any mucus out into a tissue.  6. Rest and repeat the steps once or twice as needed. SEEK MEDICAL CARE IF:   You are coughing up more mucus than usual.   There is a change in the color or thickness of your mucus.   Your breathing is more labored than usual.   Your breathing is faster than usual.  SEEK IMMEDIATE MEDICAL CARE IF:   You have shortness of breath while you are resting.   You have shortness of breath that prevents you from:  Being able to talk.   Performing your usual physical activities.   You have chest pain lasting longer than 5 minutes.   Your skin color is more cyanotic than usual.  You measure low oxygen saturations for longer than 5 minutes with a pulse oximeter. MAKE SURE YOU:   Understand these instructions.  Will watch your condition.  Will get help right away if you are not doing well or get worse. Document Released: 12/05/2004 Document Revised: 07/12/2013 Document Reviewed: 10/22/2012 [facility] Patient Information 2015 [place], [place]. This information is not intended to replace advice given to you by your health care provider. Make sure you discuss any questions you have with your health care provider. Upper Respiratory Infection, Adult An upper respiratory infection (URI) is also sometimes known as the common cold. The upper respiratory tract includes the nose, sinuses, throat,  trachea, and bronchi. Bronchi are the airways leading to the lungs. Most people improve within 1 week, but symptoms can last up to 2 weeks. A residual cough may last even longer.  CAUSES Many different viruses can infect the tissues lining the upper respiratory tract. The tissues become irritated and inflamed and often become very moist. Mucus production is also common. A cold is contagious. You can easily spread the virus to others by oral contact. This includes kissing, sharing a glass, coughing, or sneezing. Touching your mouth or nose and then touching a surface, which is then touched by another person, can also spread the virus. SYMPTOMS  Symptoms typically develop 1 to 3 days after you come in contact with a cold virus. Symptoms vary from person to person. They may include:  Runny nose.  Sneezing.  Nasal congestion.  Sinus irritation.  Sore throat.  Loss of voice (laryngitis).  Cough.  Fatigue.  Muscle aches.  Loss of appetite.  Headache.  Low-grade fever. DIAGNOSIS  You might diagnose your own cold based on familiar symptoms, since most people get a cold 2 to 3 times a year. Your caregiver can confirm this based on your exam. Most importantly, your caregiver can check that your symptoms are not due to another disease such as strep throat, sinusitis, pneumonia, asthma, or epiglottitis. Blood tests,  throat tests, and X-rays are not necessary to diagnose a common cold, but they may sometimes be helpful in excluding other more serious diseases. Your caregiver will decide if any further tests are required. RISKS AND COMPLICATIONS  You may be at risk for a more severe case of the common cold if you smoke cigarettes, have chronic heart disease (such as heart failure) or lung disease (such as asthma), or if you have a weakened immune system. The very young and very old are also at risk for more serious infections. Bacterial sinusitis, middle ear infections, and bacterial pneumonia can  complicate the common cold. The common cold can worsen asthma and chronic obstructive pulmonary disease (COPD). Sometimes, these complications can require emergency medical care and may be life-threatening. PREVENTION  The best way to protect against getting a cold is to practice good hygiene. Avoid oral or hand contact with people with cold symptoms. Wash your hands often if contact occurs. There is no clear evidence that vitamin C, vitamin E, echinacea, or exercise reduces the chance of developing a cold. However, it is always recommended to get plenty of rest and practice good nutrition. TREATMENT  Treatment is directed at relieving symptoms. There is no cure. Antibiotics are not effective, because the infection is caused by a virus, not by bacteria. Treatment may include:  Increased fluid intake. Sports drinks offer valuable electrolytes, sugars, and fluids.  Breathing heated mist or steam (vaporizer or shower).  Eating chicken soup or other clear broths, and maintaining good nutrition.  Getting plenty of rest.  Using gargles or lozenges for comfort.  Controlling fevers with ibuprofen or acetaminophen as directed by your caregiver.  Increasing usage of your inhaler if you have asthma. Zinc gel and zinc lozenges, taken in the first 24 hours of the common cold, can shorten the duration and lessen the severity of symptoms. Pain medicines may help with fever, muscle aches, and throat pain. A variety of non-prescription medicines are available to treat congestion and runny nose. Your caregiver can make recommendations and may suggest nasal or lung inhalers for other symptoms.  HOME CARE INSTRUCTIONS   Only take over-the-counter or prescription medicines for pain, discomfort, or fever as directed by your caregiver.  Use a warm mist humidifier or inhale steam from a shower to increase air moisture. This may keep secretions moist and make it easier to breathe.  Drink enough water and fluids to  keep your urine clear or pale yellow.  Rest as needed.  Return to work when your temperature has returned to normal or as your caregiver advises. You may need to stay home longer to avoid infecting others. You can also use a face mask and careful hand washing to prevent spread of the virus. SEEK MEDICAL CARE IF:   After the first few days, you feel you are getting worse rather than better.  You need your caregiver's advice about medicines to control symptoms.  You develop chills, worsening shortness of breath, or brown or red sputum. These may be signs of pneumonia.  You develop yellow or brown nasal discharge or pain in the face, especially when you bend forward. These may be signs of sinusitis.  You develop a fever, swollen neck glands, pain with swallowing, or white areas in the back of your throat. These may be signs of strep throat. SEEK IMMEDIATE MEDICAL CARE IF:   You have a fever.  You develop severe or persistent headache, ear pain, sinus pain, or chest pain.  You develop wheezing,  a prolonged cough, cough up blood, or have a change in your usual mucus (if you have chronic lung disease).  You develop sore muscles or a stiff neck. Document Released: 08/21/2000 Document Revised: 05/20/2011 Document Reviewed: 06/02/2013 [facility] Patient Information 2015 [place], [place]. This information is not intended to replace advice given to you by your health care provider. Make sure you discuss any questions you have with your health care provider.

## 2014-07-28 ENCOUNTER — Ambulatory Visit: Payer: Self-pay | Attending: Family Medicine | Admitting: Family Medicine

## 2014-07-28 ENCOUNTER — Encounter: Payer: Self-pay | Admitting: Family Medicine

## 2014-07-28 VITALS — BP 116/86 | HR 82 | Temp 97.9°F | Resp 16 | Ht 76.0 in | Wt 219.0 lb

## 2014-07-28 DIAGNOSIS — R5382 Chronic fatigue, unspecified: Secondary | ICD-10-CM

## 2014-07-28 DIAGNOSIS — K0510 Chronic gingivitis, plaque induced: Secondary | ICD-10-CM | POA: Insufficient documentation

## 2014-07-28 DIAGNOSIS — K088 Other specified disorders of teeth and supporting structures: Secondary | ICD-10-CM

## 2014-07-28 DIAGNOSIS — M542 Cervicalgia: Secondary | ICD-10-CM | POA: Insufficient documentation

## 2014-07-28 DIAGNOSIS — J3489 Other specified disorders of nose and nasal sinuses: Secondary | ICD-10-CM

## 2014-07-28 DIAGNOSIS — K089 Disorder of teeth and supporting structures, unspecified: Secondary | ICD-10-CM

## 2014-07-28 DIAGNOSIS — F17200 Nicotine dependence, unspecified, uncomplicated: Secondary | ICD-10-CM | POA: Insufficient documentation

## 2014-07-28 DIAGNOSIS — G629 Polyneuropathy, unspecified: Secondary | ICD-10-CM

## 2014-07-28 DIAGNOSIS — R062 Wheezing: Secondary | ICD-10-CM

## 2014-07-28 DIAGNOSIS — G8929 Other chronic pain: Secondary | ICD-10-CM | POA: Insufficient documentation

## 2014-07-28 DIAGNOSIS — G894 Chronic pain syndrome: Secondary | ICD-10-CM

## 2014-07-28 DIAGNOSIS — J320 Chronic maxillary sinusitis: Secondary | ICD-10-CM | POA: Insufficient documentation

## 2014-07-28 MED ORDER — ALBUTEROL SULFATE HFA 108 (90 BASE) MCG/ACT IN AERS: 2.0000 | INHALATION_SPRAY | RESPIRATORY_TRACT | Status: DC | PRN

## 2014-07-28 MED ORDER — GABAPENTIN 400 MG PO CAPS: 800.0000 mg | ORAL_CAPSULE | Freq: Three times a day (TID) | ORAL | Status: DC

## 2014-07-28 MED ORDER — VENLAFAXINE HCL ER 75 MG PO CP24: 75.0000 mg | ORAL_CAPSULE | Freq: Every day | ORAL | Status: DC

## 2014-07-28 NOTE — Assessment & Plan Note (Signed)
Wheezing: improving following recent bronchitis .   Albuterol refilled Avoid smoking Smoking cessation support: smoking cessation hotline: 1-800-QUIT-NOW.  Smoking cessation classes are available through [facility]. Call [phone] or visit our website at [email or web address]

## 2014-07-28 NOTE — Progress Notes (Signed)
ER F/U weezing x 10 days Elevated temperature at home  Hx tobacco 5-10 cigarette per day Dental referrals Complaining of numbness and pain on hands

## 2014-07-28 NOTE — Assessment & Plan Note (Addendum)
Fatigue and poor energy Avoid alcohol Eat a well rounded and varied diet with a lot of vegetables and lean protein Starting effexor to target chronic pain and depression symptoms

## 2014-07-28 NOTE — Patient Instructions (Signed)
Mr. Damon,  Thank you for coming in today  1. Wheezing: improving. Albuterol refilled Avoid smoking Smoking cessation support: smoking cessation hotline: 1-800-QUIT-NOW.  Smoking cessation classes are available through [facility]. Call [phone] or visit our website at [email or web address]   2. Fatigue and poor energy Avoid alcohol Eat a well rounded and varied diet with a lot of vegetables and lean protein Starting effexor to target chronic pain and depression symptoms  3. Oral pain with poor dentition: dental referral  4. Sinus pressure and pain: CT sinuses to evaluate for chronic sinusitis   5. Numbness in hands and feet:  Most likely causes are history of alcohol abuse, I recommend complete avoidance of alcohol Also could be radicular pain from neck and back Continue gabapentin   F/u in 4-6 weeks for chronic fatigue starting effexor and to review CT of sinuses    Dr. Adrian Blackwater

## 2014-07-28 NOTE — Assessment & Plan Note (Signed)
Oral pain with poor dentition: dental referral

## 2014-07-28 NOTE — Progress Notes (Signed)
   Subjective:    Patient ID: Mark [name], male    DOB: 1965-12-27, 49 y.o.   MRN: [id number] CC:  HPI  1. Wheezing: improved. Has cough. Non-productive. Had fever with T max 102 over the weekend. Finished Z-pack. Not using albuterol. Smokes 1/2 PPD. Last resp illness was PNA 4 years ago.   2. Dental referral: jaw pain. Haven't been to the denti sit in 6 years. Admits to bleediing. Gums. Unsure about swelling. Associated with pressure under eyes. Has headache daily.   3. Numbness in both hands: R worse then L.  Both hands. Also foot numbness. Has chronic neck and back pain. Taking tylenol #3 daily. Requesting RF. Has been treated with cymbalta in the past but felt too lethargic while taking it.    Soc Hx;; non smoker  Review of Systems  Constitutional: Positive for fever. Negative for chills and unexpected weight change.  Respiratory: Positive for cough. Negative for shortness of breath.   Musculoskeletal: Positive for back pain and neck pain.       Objective:   Physical Exam BP 116/86 mmHg  Pulse 82  Temp(Src) 97.9 F (36.6 C) (Oral)  Resp 16  Ht 6\' 4"  (1.93 m)  Wt 219 lb (99.338 kg)  BMI 26.67 kg/m2  SpO2 96% General appearance: alert, cooperative and no distress Head: Normocephalic, without obvious abnormality, atraumatic Eyes: conjunctivae/corneas clear. PERRL, EOM's intact.  Ears: normal TM and external ear canal right ear and abnormal TM left ear - retracted Nose: no discharge, turbinates pink, swollen Throat: normal findings: lips normal without lesions, buccal mucosa normal and oropharynx pink & moist without lesions or evidence of thrush and abnormal findings: dentition: poor and gingivitis Neck: no adenopathy, supple, symmetrical, trachea midline and thyroid not enlarged, symmetric, no tenderness/mass/nodules Lungs: wheezes bibasilar Heart: regular rate and rhythm, S1, S2 normal, no murmur, click, rub or gallop       Assessment & Plan:

## 2014-07-28 NOTE — Assessment & Plan Note (Signed)
Sinus pressure and pain: CT sinuses to evaluate for chronic sinusitis

## 2014-07-28 NOTE — Assessment & Plan Note (Signed)
Numbness in hands and feet:  Most likely causes are history of alcohol abuse, I recommend complete avoidance of alcohol Also could be radicular pain from neck and back Continue gabapentin

## 2014-08-02 ENCOUNTER — Ambulatory Visit (HOSPITAL_COMMUNITY)
Admission: RE | Admit: 2014-08-02 | Discharge: 2014-08-02 | Disposition: A | Discharge status: Home / Self Care | Payer: Self-pay | Source: Ambulatory Visit | Attending: Family Medicine | Admitting: Family Medicine

## 2014-08-02 ENCOUNTER — Other Ambulatory Visit: Payer: Self-pay | Admitting: *Deleted

## 2014-08-02 DIAGNOSIS — J349 Unspecified disorder of nose and nasal sinuses: Secondary | ICD-10-CM | POA: Insufficient documentation

## 2014-08-02 DIAGNOSIS — J3489 Other specified disorders of nose and nasal sinuses: Secondary | ICD-10-CM

## 2014-08-02 NOTE — Telephone Encounter (Signed)
Patient called asking for refill but did not indicate which medication he needed.  Left message for patient to return call.

## 2014-08-03 ENCOUNTER — Encounter: Payer: Self-pay | Admitting: Physical Therapy

## 2014-08-03 ENCOUNTER — Ambulatory Visit: Payer: No Typology Code available for payment source | Admitting: Physical Therapy

## 2014-08-03 ENCOUNTER — Other Ambulatory Visit: Payer: Self-pay | Admitting: Family Medicine

## 2014-08-03 DIAGNOSIS — J320 Chronic maxillary sinusitis: Secondary | ICD-10-CM

## 2014-08-03 DIAGNOSIS — G894 Chronic pain syndrome: Secondary | ICD-10-CM

## 2014-08-03 DIAGNOSIS — R531 Weakness: Secondary | ICD-10-CM

## 2014-08-03 MED ORDER — FLUTICASONE PROPIONATE 50 MCG/ACT NA SUSP: 2.0000 | Freq: Every day | NASAL | Status: DC

## 2014-08-03 MED ORDER — AMOXICILLIN-POT CLAVULANATE 500-125 MG PO TABS: 1.0000 | ORAL_TABLET | Freq: Three times a day (TID) | ORAL | Status: DC

## 2014-08-03 NOTE — Therapy (Addendum)
[facility] [address], [place], [place], [address] Phone: [phone]   Fax:  [phone]  Physical Therapy Evaluation  Patient Details  Name: Mark [name] MRN: [id number] Date of Birth: 10/05/1965 Referring Provider:  Boykin Nearing, MD  Encounter Date: 08/03/2014      PT End of Session - 08/03/14 1402    Visit Number 1   Date for PT Re-Evaluation 09/28/14   PT Start Time 1230   PT Stop Time 1315   PT Time Calculation (min) 45 min   Activity Tolerance Patient tolerated treatment well   Behavior During Therapy [facility] for tasks assessed/performed      Past Medical History  Diagnosis Date  . 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 ETOH abuse   . Mental disorder   . Irregular heart beat   . Shortness of breath   . Headache(784.0)   . Hepatitis   . Alcohol withdrawal   . Neuropathy 06/07/2013  . 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 Seizure due to alcohol withdrawal 01/23/2014    Per patient report  . Anxiety Dx 2014  . GERD (gastroesophageal reflux disease) Dx 2003  . Hyperlipidemia Dx 2000  . Hypertension Dx 2011  . Withdrawal seizures 2014    Past Surgical History  Procedure Laterality Date  . Back surgery    . Neck surgery    . Fundoplasty transthoracic  2003  . Nasal sinus surgery    . Left heart catheterization with coronary angiogram N/A 01/13/2014    Procedure: LEFT HEART CATHETERIZATION WITH CORONARY ANGIOGRAM;  Surgeon: Clent Demark, MD;  Location: [facility] CATH LAB;  Service: Cardiovascular;  Laterality: N/A;    There were no vitals filed for this visit.  Visit Diagnosis:  Chronic pain syndrome - Plan: PT plan of care cert/re-cert  Weakness - Plan: PT plan of care cert/re-cert      Subjective Assessment - 08/03/14 1245    Subjective Patient reports chronic pain all over.  He has a fusion in cervical and lumbar.  Patient reports he is getting  neuropathy from knees down.  Patient reports he saw an orgh   Limitations Walking;Standing   How long can you stand comfortably? feels like standing on stumps   How long can you walk comfortably? difficulty with walking due to neuropathy in feet   Patient Stated Goals function and manage pain   Currently in Pain? Yes   Pain Score 7    Pain Location Other (Comment)  Neck, back, feet   Pain Orientation Right;Left;Mid;Lower   Pain Descriptors / Indicators Nagging;Sharp;Numbness   Pain Type Chronic pain   Pain Frequency Constant   Aggravating Factors  standing on feet, activity   Pain Relieving Factors focus on no pain   Multiple Pain Sites No            [facility] PT Assessment - 08/03/14 0001    Assessment   Medical Diagnosis G89.4 Chronic pain syndrome   Onset Date/Surgical Date 03/12/98   Prior Therapy yes   Precautions   Precautions Shoulder   Type of Shoulder Precautions tear in right RTC that is being followed by Orthopedic   Balance Screen   Has the patient fallen in the past 6 months Yes  due to parasthesias in feet   How many times? 2   Has the patient had a decrease in activity level because of a fear of falling?  No   Is the patient reluctant to leave their home because of a fear of falling?  No   Prior Function   Level of Independence Independent   Observation/Other Assessments   Focus on Therapeutic Outcomes (FOTO)  62% limitation   AROM   Cervical Flexion decreased by 75%   Cervical Extension decreased by 75%   Cervical - Right Side Bend decreased by 50%   Cervical - Left Side Bend decreased by 50%   Cervical - Right Rotation decreased by 25%   Cervical - Left Rotation decreased by 25%   Lumbar Flexion decreased by 50%   Lumbar Extension decreased by 50%   Lumbar - Right Side Bend decreased 50%   Lumbar - Left Side Bend decreased by 50%   PROM   Overall PROM Comments hip ER left 60 right 45   Strength   Overall Strength Comments bil. hip strength 2/5    Right/Left Shoulder --  left shoulder 4/5   Right/Left Knee --  knee extension 3-/5, extend to -20 bil.    Berg Balance Test   Sit to Stand Able to stand  independently using hands   Standing Unsupported Able to stand safely 2 minutes   Sitting with Back Unsupported but Feet Supported on Floor or Stool Able to sit safely and securely 2 minutes   Stand to Sit Controls descent by using hands   Transfers Able to transfer safely, definite need of hands   Standing Unsupported with Eyes Closed Able to stand 10 seconds safely  wide stance with body sway   Standing Ubsupported with Feet Together Able to place feet together independently and stand 1 minute safely   From Standing, Reach Forward with Outstretched Arm Can reach confidently >25 cm (10")   From Standing Position, Pick up Object from Floor Able to pick up shoe safely and easily   From Standing Position, Turn to Look Behind Over each Shoulder Looks behind from both sides and weight shifts well   Turn 360 Degrees Able to turn 360 degrees safely in 4 seconds or less   Standing Unsupported, Alternately Place Feet on Step/Stool Able to stand independently and safely and complete 8 steps in 20 seconds   Standing Unsupported, One Foot in [employer] balance while stepping or standing   Standing on One Leg Tries to lift leg/unable to hold 3 seconds but remains standing independently   Total Score 46                           PT Education - 08/03/14 1402    Education provided No          PT Short Term Goals - 08/03/14 1411    PT SHORT TERM GOAL #1   Title understand how to perform diaphragmatic breathing to manage his pain while exercise   Time 4   Period Weeks   Status New   PT SHORT TERM GOAL #2   Title understand correct body mechanics with daily tasks to reduce strain on neck and back   Time 4   Period Weeks   Status New   PT SHORT TERM GOAL #3   Title understand what is in the community for exercise  programs including aquatics   Time 4   Period Weeks   Status New           PT Long Term Goals - 08/03/14 1415    PT LONG TERM GOAL #1  Title ability to move with daily tasks with >/= 40% due to increased overall strength and mobility   Time 8   Period Weeks   Status New   PT LONG TERM GOAL #2   Title understand ways to adjust his living space to make it easier to move around safely   Time 8   Period Weeks   Status New   PT LONG TERM GOAL #3   Title tandem stance >/= 30 sec with one foot ahead of other due to improve balance   Time 8   Period Weeks   Status New   PT LONG TERM GOAL #4   Title Berg balance score >/= 49/56   Time 8   Period Weeks   Status New   PT LONG TERM GOAL #5   Title ability to get up and down from a chair with >/= 25% greater ease due to increased bilateral lower extremity strength   Time 8   Period Weeks   Status New               Plan - 08/03/14 1403    Clinical Impression Statement Patient is a 49 year old male with Chronic Pain Syndrome.  Patient had surgery to cervical and lumbar area several years ago, parathesias in bilateral feet, COPD, and torn RTC on right shoulder. Patient reports he is seeing an orthopedic surgeon for his right shoulder to see if he need surgery.  FOTO score is 62% limitation.  Patient would benfit from physical therapy to improve his movement, improve balance, understand ways to manage pain and understand ways to adapt his environment to make it easier and safer to move around.    Pt will benefit from skilled therapeutic intervention in order to improve on the following deficits Decreased range of motion;Difficulty walking;Increased muscle spasms;Decreased activity tolerance;Impaired perceived functional ability;Pain;Impaired flexibility;Decreased balance;Decreased mobility;Decreased strength;Impaired sensation   Rehab Potential Good   PT Frequency 2x / week   PT Duration 8 weeks   PT Treatment/Interventions  ADLs/Self Care Home Management;Cryotherapy;Electrical Stimulation;Gait training;Ultrasound;Moist Heat;Functional mobility training;Therapeutic activities;Therapeutic exercise;Balance training;Neuromuscular re-education;Manual techniques;Patient/family education;Passive range of motion   PT Next Visit Plan balance exercises in standing for tandem stance with eyes open and closed, flexibility exercises, diaphragmatic breathing, guided imagery, body mechanics, core stabilization   PT Home Exercise Plan balance exercises   Recommended Other Services None   Consulted and Agree with Plan of Care Patient         Problem List Patient Active Problem List   Diagnosis Date Noted  . Poor dentition 07/28/2014  . Sinus pressure 07/28/2014  . Wheezing 07/28/2014  . Vitamin D insufficiency 07/05/2014  . Closed fracture of 5th metacarpal 07/04/2014  . Chronic fatigue 07/04/2014  . Chronic low back pain 06/02/2014  . Pain in joint, shoulder region 05/12/2014  . Depression 02/08/2014  . Neuropathy 02/08/2014  . Adjustment disorder with depressed mood 02/08/2014  . Alcohol dependence with alcohol-induced mood disorder   . 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 Opioid dependence 01/22/2014    Class: Acute  . Paroxysmal VT 12/19/2013  . Chronic pain syndrome 12/28/2012  . Transaminitis 06/09/2012  . 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 Alcohol abuse 01/19/2012  . Tobacco abuse 01/19/2012  . Hypertension   . Hyperlipidemia     GRAY,CHERYL,PT 08/03/2014, 2:28 PM  [facility] [address] [place], [place], [address] Phone: [phone]   Fax:  [phone]

## 2014-08-03 NOTE — Assessment & Plan Note (Signed)
CT head revealed thickening in R>L maxillary sinuses consistent with chronic sinusitis plans for antibiotics and steroids ordered

## 2014-08-04 ENCOUNTER — Ambulatory Visit: Payer: Self-pay | Admitting: Sports Medicine

## 2014-08-05 ENCOUNTER — Other Ambulatory Visit: Payer: Self-pay | Admitting: *Deleted

## 2014-08-05 DIAGNOSIS — J320 Chronic maxillary sinusitis: Secondary | ICD-10-CM

## 2014-08-05 MED ORDER — AMOXICILLIN-POT CLAVULANATE 875-125 MG PO TABS: 1.0000 | ORAL_TABLET | Freq: Two times a day (BID) | ORAL | Status: DC

## 2014-08-10 ENCOUNTER — Telehealth: Payer: Self-pay | Admitting: *Deleted

## 2014-08-10 ENCOUNTER — Ambulatory Visit: Payer: No Typology Code available for payment source | Attending: Family Medicine | Admitting: Physical Therapy

## 2014-08-10 ENCOUNTER — Encounter: Payer: Self-pay | Admitting: Physical Therapy

## 2014-08-10 ENCOUNTER — Encounter: Payer: Self-pay | Admitting: Sports Medicine

## 2014-08-10 ENCOUNTER — Other Ambulatory Visit: Payer: Self-pay | Admitting: Sports Medicine

## 2014-08-10 ENCOUNTER — Ambulatory Visit (INDEPENDENT_AMBULATORY_CARE_PROVIDER_SITE_OTHER): Payer: Self-pay | Admitting: Sports Medicine

## 2014-08-10 VITALS — BP 134/84 | Ht 76.0 in | Wt 220.0 lb

## 2014-08-10 DIAGNOSIS — M25511 Pain in right shoulder: Secondary | ICD-10-CM

## 2014-08-10 DIAGNOSIS — G894 Chronic pain syndrome: Secondary | ICD-10-CM

## 2014-08-10 DIAGNOSIS — R531 Weakness: Secondary | ICD-10-CM | POA: Insufficient documentation

## 2014-08-10 NOTE — Telephone Encounter (Signed)
Pt aware of results Pick up antibiotic from pharmacy today

## 2014-08-10 NOTE — Therapy (Signed)
[facility] [address], [place], [place], [address] Phone: [phone]   Fax:  [phone]  Physical Therapy Treatment  Patient Details  Name: Mark [name] MRN: [id number] Date of Birth: November 25, 1965 Referring Provider:  Boykin Nearing, MD  Encounter Date: 08/10/2014      PT End of Session - 08/10/14 1703    Visit Number 2   Date for PT Re-Evaluation 09/28/14   PT Start Time 1615   PT Stop Time 1655   PT Time Calculation (min) 40 min   Activity Tolerance Patient tolerated treatment well   Behavior During Therapy [facility] for tasks assessed/performed      Past Medical History  Diagnosis Date  . 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 ETOH abuse   . Mental disorder   . Irregular heart beat   . Shortness of breath   . Headache(784.0)   . Hepatitis   . Alcohol withdrawal   . Neuropathy 06/07/2013  . 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 Seizure due to alcohol withdrawal 01/23/2014    Per patient report  . Anxiety Dx 2014  . GERD (gastroesophageal reflux disease) Dx 2003  . Hyperlipidemia Dx 2000  . Hypertension Dx 2011  . Withdrawal seizures 2014    Past Surgical History  Procedure Laterality Date  . Back surgery    . Neck surgery    . Fundoplasty transthoracic  2003  . Nasal sinus surgery    . Left heart catheterization with coronary angiogram N/A 01/13/2014    Procedure: LEFT HEART CATHETERIZATION WITH CORONARY ANGIOGRAM;  Surgeon: Clent Demark, MD;  Location: [facility] CATH LAB;  Service: Cardiovascular;  Laterality: N/A;    There were no vitals filed for this visit.  Visit Diagnosis:  Chronic pain syndrome      Subjective Assessment - 08/10/14 1621    Subjective MRI of shoulder.  Patient has no change in pain.    Limitations Standing;Walking   How long can you stand comfortably? feels like standing on stumps   How long can you walk comfortably? difficulty with  walking due to neuropathy in feet   Patient Stated Goals function and manage pain   Currently in Pain? Yes   Pain Score 7    Pain Location --  Neck, back, feet   Pain Orientation Right;Left;Mid;Lower   Pain Descriptors / Indicators Sharp;Numbness;Nagging   Pain Type Chronic pain   Pain Onset More than a month ago   Pain Frequency Constant   Aggravating Factors  standing on feet, activity   Pain Relieving Factors focus on no pain   Multiple Pain Sites No                         [facility] Adult PT Treatment/Exercise - 08/10/14 0001    Exercises   Exercises Lumbar   Neck Exercises: Seated   Other Seated Exercise levator stretch hold 15 sec 2 times bil.; upper cervica stretch hold 15 sec 2 times each   Lumbar Exercises: Stretches   Active Hamstring Stretch 2 reps;30 seconds  bil.    Single Knee to Chest Stretch 2 reps;30 seconds  bil.    Lower Trunk Rotation 2 reps;30 seconds  bil.                 PT Education - 08/10/14 1650    Education provided Yes   Education Details Balance, flexibility  exercises, diaphragmatic breathing   Person(s) Educated Patient   Methods Explanation;Demonstration;Tactile cues;Verbal cues;Handout   Comprehension Verbalized understanding;Returned demonstration          PT Short Term Goals - 08/10/14 1652    PT SHORT TERM GOAL #1   Title understand how to perform diaphragmatic breathing to manage his pain while exercise   Time 4   Period Weeks   Status Achieved   PT SHORT TERM GOAL #2   Title understand correct body mechanics with daily tasks to reduce strain on neck and back   Time 4   Period Weeks   Status Achieved   PT SHORT TERM GOAL #3   Title understand what is in the community for exercise programs including aquatics   Time 4   Period Weeks   Status New           PT Long Term Goals - 08/03/14 1415    PT LONG TERM GOAL #1   Title ability to move with daily tasks with >/= 40% due to increased overall  strength and mobility   Time 8   Period Weeks   Status New   PT LONG TERM GOAL #2   Title understand ways to adjust his living space to make it easier to move around safely   Time 8   Period Weeks   Status New   PT LONG TERM GOAL #3   Title tandem stance >/= 30 sec with one foot ahead of other due to improve balance   Time 8   Period Weeks   Status New   PT LONG TERM GOAL #4   Title Berg balance score >/= 49/56   Time 8   Period Weeks   Status New   PT LONG TERM GOAL #5   Title ability to get up and down from a chair with >/= 25% greater ease due to increased bilateral lower extremity strength   Time 8   Period Weeks   Status New               Plan - 08/10/14 1657    Clinical Impression Statement Patient is a 49 year old male with chronic pain syndrome.  This is patients second visit therefore has not met many goals.  Patient has met STG's #1 and #2. Patient has learned diaphragmatic breathing to calm his nerve pain.  Patient felt more flexible after therapy due to the stretches.  PT brieflty discussed with patient  on body mechanics and he was able to verbally repeat the correct body mechanics with sitting and standing and lifting due to the many back and cervical surgeries.  Patient will benefit with balance exercises and learning how to move with less pain.    Pt will benefit from skilled therapeutic intervention in order to improve on the following deficits Decreased range of motion;Difficulty walking;Increased muscle spasms;Decreased activity tolerance;Impaired perceived functional ability;Pain;Impaired flexibility;Decreased balance;Decreased mobility;Decreased strength;Impaired sensation   PT Frequency 2x / week   PT Duration 8 weeks   PT Treatment/Interventions ADLs/Self Care Home Management;Cryotherapy;Electrical Stimulation;Gait training;Ultrasound;Moist Heat;Functional mobility training;Therapeutic activities;Therapeutic exercise;Balance training;Neuromuscular  re-education;Manual techniques;Patient/family education;Passive range of motion   PT Next Visit Plan balance, abdominal bracing   PT Home Exercise Plan abdominal bracing   Consulted and Agree with Plan of Care Patient        Problem List Patient Active Problem List   Diagnosis Date Noted  . Poor dentition 07/28/2014  . Chronic maxillary sinusitis 07/28/2014  . Wheezing 07/28/2014  .  Vitamin D insufficiency 07/05/2014  . Closed fracture of 5th metacarpal 07/04/2014  . Chronic fatigue 07/04/2014  . Chronic low back pain 06/02/2014  . Pain in joint, shoulder region 05/12/2014  . Depression 02/08/2014  . Neuropathy 02/08/2014  . Adjustment disorder with depressed mood 02/08/2014  . Alcohol dependence with alcohol-induced mood disorder   . 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 Opioid dependence 01/22/2014    Class: Acute  . Paroxysmal VT 12/19/2013  . Chronic pain syndrome 12/28/2012  . Transaminitis 06/09/2012  . 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 Alcohol abuse 01/19/2012  . Tobacco abuse 01/19/2012  . Hypertension   . Hyperlipidemia     Carloyn Lahue,PT 08/10/2014, 5:03 PM  [facility] [address], [place], [place], [address] Phone: [phone]   Fax:  [phone]

## 2014-08-10 NOTE — Telephone Encounter (Signed)
-----   Message from Boykin Nearing, MD sent at 08/03/2014  2:51 PM EDT ----- CT head revealed thickening in R>L maxillary sinuses consistent with chronic sinusitis plans for antibiotics and steroids ordered

## 2014-08-10 NOTE — Patient Instructions (Addendum)
Tandem Stance  Stand at a counter and can hold onto if needed.  Right foot in front of left, heel touching toe both feet "straight ahead". Stand on Foot Triangle of Support with both feet. Balance in this position _30__ seconds. Do with left foot in front of right. Then switch.  3 times each way.   Copyright  VHI. All rights reserved.  Knee-to-Chest Stretch: Unilateral   With hand behind right knee, pull knee in to chest until a comfortable stretch is felt in lower back and buttocks. Keep back relaxed. Hold _30___ seconds. Repeat __2__ times per set. Do __2__ sets per session. Do __2__ sessions per day.  http://orth.exer.us/126   Copyright  VHI. All rights reserved.  Hamstring Stretch: Active   Support behind right knee. Starting with knee bent, attempt to straighten knee until a comfortable stretch is felt in back of thigh. Hold _30___ seconds. Repeat ___2_ times per set. Do _1___ sets per session. Do __1__ sessions per day. Then do on other leg. Lumbar Rotation: Caudal - Bilateral (Supine)   Feet and knees together, arms outstretched, rotate knees left, turning head in opposite direction, until stretch is felt. Hold _15___ seconds. Relax. Repeat __2__ times per set. Do __1__ sets per session. Do _1___ sessions per day.  http://orth.exer.us/1020   Copyright  VHI. All rights reserved.      Marland Kitchen  Hook-Lying   Lie with hips and knees bent. Allow body's muscles to relax. Place hands on belly. Inhale slowly and deeply for _3__ seconds, so hands move up. Then take _3__ seconds to exhale. Repeat _3__ times. Do __1 times a day. Avoid head rush  Copyright  VHI. All rights reserved.  Levator Scapula Stretch, Sitting   Sit, one hand tucked under hip on side to be stretched, other hand over top of head. Turn head toward other side and look down. Use hand on head to gently stretch neck in that position. Hold _15__ seconds. Repeat __2_ times per session. Do _1__ sessions per  day. Then do on other side.  Copyright  VHI. All rights reserved.  Upper Cervical With Lower Cervical Flexion Lock, Sitting   Sit, chin tucked in and down. Tilt head toward shoulder while tucking chin into neck. Hold 30___ seconds. Repeat to other side. Repeat _2__ times per session. Do __1_ sessions per day. Then do the other side.  Copyright  VHI. All rights reserved.  [place] [address], [place], [place] [address] Phone # [phone] Fax [phone]

## 2014-08-11 ENCOUNTER — Ambulatory Visit: Payer: No Typology Code available for payment source | Admitting: Physical Therapy

## 2014-08-11 NOTE — Assessment & Plan Note (Signed)
Right neck and shoulder pain likely from intra-articular pathology or significant crepitation with axial loading and grinding consistent with potential labral change. Continue physical therapy. Not a candidate for long-term opiates.

## 2014-08-11 NOTE — Progress Notes (Signed)
Mark [name] - 49 y.o. male MRN [id number]  Date of birth: September 12, 1965  SUBJECTIVE: Including CC, HPI, ROS HISTORY:  CC: Right shoulder pain, follow-up   HPI: Persistent right shoulder pain following injection last visit. Reports approximately one week of minimal improvement. Pain is described as constant aching and stabbing pain in the proximal arm does not typically radiate past the elbow. Essentially unchanged following injection. Reports only maybe 1 week of relief. Has been seen in physical therapy for his chronic pain syndrome as well.   ROS: Persistent numbness and tingling of bilateral shoulder and upper extremity. Reports this seems significantly different and more localized to the shoulder compared to his prior neck issues.    multiple medical problems, reviewed including polysubstance abuse, neuropathy, dilated cardiomyopathy alcohol dependence. Recent closed fracture of fifth metacarpal. No specialty comments available. Social History   Occupational History  . [name]    Social History Main Topics  . Smoking status: Current Every Day Smoker -- 0.50 packs/day for 30 years    Types: Cigarettes  . Smokeless tobacco: Never Used  . Alcohol Use: Yes     Comment: occasionally   . Drug Use: No     Comment: Opana  . Sexual Activity: No      Problem  Pain in Joint, Shoulder Region   [place] in March 2016 on right shoulder. 06/22/2014: [facility] [place]:  Partial full-thickness tear with minimal retraction of supraspinatus. 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 No acute  distress PSYCH: Alert and appropriately interactive. SKIN: No open skin lesions or abnormal skin markings on areas inspected as below VASCULAR: radial pulses 2+/4. No significant UE edema NEURO: Upper extremity strength is 5+/5 in all myotomes; sensation is intact to light touch in all dermatomes. RIGHT SHOULDER: Overall well aligned no significant deformity. No bruising or ecchymosis. He has marked active range of motion in flexion, abduction. Only 80 of abduction 90 of flexion. Positive drop arm test. Pain with Hawkins, Neers, speeds, O'Brien's. Internal/external rotation strength 5/5 with arms at the side. Empty can testing markedly painful with 4/5 strength    ASSESSMENT & PLAN: See Patient instructions for additional information Problem List Items Addressed This Visit    Chronic pain syndrome (Chronic)    Right neck and shoulder pain likely from intra-articular pathology or significant crepitation with axial loading and grinding consistent with potential labral change. Continue physical therapy. Not a candidate for long-term opiates.      Pain in joint, shoulder region - Primary (Chronic)    Given persistence significant symptoms and no significant improvement following injection we'll obtain MRI arthrogram to further evaluate her tear cuff and intra-articular pathology with potential concern for labral tear given crepitation. Consider referral to orthopedic surgery for arthroscopic debridement versus rotator cuff repair if evidence on MRI.      Relevant Orders   MR Shoulder Right W Contrast    Other Visit Diagnoses    Right shoulder pain        Relevant Orders    DG FLUORO GUIDE NDL PLCD/BX/INJ/LOC      FOLLOW UP:  No  Follow-up on file.

## 2014-08-11 NOTE — Assessment & Plan Note (Signed)
Given persistence significant symptoms and no significant improvement following injection we'll obtain MRI arthrogram to further evaluate her tear cuff and intra-articular pathology with potential concern for labral tear given crepitation. Consider referral to orthopedic surgery for arthroscopic debridement versus rotator cuff repair if evidence on MRI.

## 2014-08-12 ENCOUNTER — Encounter: Payer: Self-pay | Admitting: Physical Therapy

## 2014-08-16 ENCOUNTER — Ambulatory Visit: Payer: No Typology Code available for payment source | Admitting: Physical Therapy

## 2014-08-16 ENCOUNTER — Encounter: Payer: Self-pay | Admitting: Physical Therapy

## 2014-08-16 DIAGNOSIS — R531 Weakness: Secondary | ICD-10-CM

## 2014-08-16 DIAGNOSIS — G894 Chronic pain syndrome: Secondary | ICD-10-CM

## 2014-08-16 NOTE — Therapy (Signed)
[facility] [address] [place], [place], [address] Phone: [phone]   Fax:  [phone]  Physical Therapy Treatment  Patient Details  Name: Mark [name] MRN: [id number] Date of Birth: Oct 12, 1965 Referring Provider:  Boykin Nearing, MD  Encounter Date: 08/16/2014      PT End of Session - 08/16/14 1433    Visit Number 3   Date for PT Re-Evaluation 09/28/14   PT Start Time 7062   PT Stop Time 1445   PT Time Calculation (min) 42 min   Activity Tolerance Patient tolerated treatment well   Behavior During Therapy [facility] for tasks assessed/performed      Past Medical History  Diagnosis Date  . 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 ETOH abuse   . Mental disorder   . Irregular heart beat   . Shortness of breath   . Headache(784.0)   . Hepatitis   . Alcohol withdrawal   . Neuropathy 06/07/2013  . 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 Seizure due to alcohol withdrawal 01/23/2014    Per patient report  . Anxiety Dx 2014  . GERD (gastroesophageal reflux disease) Dx 2003  . Hyperlipidemia Dx 2000  . Hypertension Dx 2011  . Withdrawal seizures 2014    Past Surgical History  Procedure Laterality Date  . Back surgery    . Neck surgery    . Fundoplasty transthoracic  2003  . Nasal sinus surgery    . Left heart catheterization with coronary angiogram N/A 01/13/2014    Procedure: LEFT HEART CATHETERIZATION WITH CORONARY ANGIOGRAM;  Surgeon: Clent Demark, MD;  Location: [facility] C[facility] LAB;  Service: Cardiovascular;  Laterality: N/A;    There were no vitals filed for this visit.  Visit Diagnosis:  Chronic pain syndrome  Weakness      Subjective Assessment - 08/16/14 1412    Subjective MRI is scheduled for next week for Rt shoulder   Limitations Standing;Walking   How long can you stand comfortably? feels like standing on stumps   How long can you walk comfortably? difficulty  with walking due to neuropathy in feet   Patient Stated Goals function and manage pain   Currently in Pain? Yes   Pain Score 7    Pain Location --  pain is everywhere   Pain Orientation Right;Left;Lower   Pain Type Chronic pain   Pain Onset More than a month ago   Pain Frequency Constant   Aggravating Factors  standing on feet, activity   Pain Relieving Factors focus on stretching to help reduce pain   Multiple Pain Sites No                         [facility] Adult PT Treatment/Exercise - 08/16/14 0001    Exercises   Exercises Lumbar   Lumbar Exercises: Stretches   Active Hamstring Stretch 2 reps;30 seconds;Other (comment)  bil using hands very limited and praticed in sitting x 3   Single Knee to Chest Stretch 2 reps;30 seconds   Lower Trunk Rotation 2 reps;30 seconds   Lumbar Exercises: Aerobic   UBE (Upper Arm Bike) L1 x 4 min (2'/2)   challenging for pt. used slow pace to complete task   Lumbar Exercises: Standing   Other Standing Lumbar Exercises Trampoline 3 directions x 1 min without shoes   Lumbar Exercises: Seated   Other Seated Lumbar Exercises practiced diaphragmatic  breathing 3 x 5                  PT Short Term Goals - 08/16/14 1437    PT SHORT TERM GOAL #1   Title understand how to perform diaphragmatic breathing to manage his pain while exercise   Time 4   Period Weeks   Status Achieved   PT SHORT TERM GOAL #2   Title understand correct body mechanics with daily tasks to reduce strain on neck and back   Time 4   Period Weeks   Status Achieved   PT SHORT TERM GOAL #3   Title understand what is in the community for exercise programs including aquatics   Time 4   Period Weeks   Status On-going           PT Long Term Goals - 08/16/14 1438    PT LONG TERM GOAL #1   Title ability to move with daily tasks with >/= 40% due to increased overall strength and mobility   Time 4   Period Weeks   Status On-going   PT LONG TERM GOAL #2    Title understand ways to adjust his living space to make it easier to move around safely   Time 8   Period Weeks   Status On-going   PT LONG TERM GOAL #3   Title tandem stance >/= 30 sec with one foot ahead of other due to improve balance   Time 8   Period Weeks   Status On-going   PT LONG TERM GOAL #4   Title Berg balance score >/= 49/56   Time 8   Period Weeks   Status On-going   PT LONG TERM GOAL #5   Title ability to get up and down from a chair with >/= 25% greater ease due to increased bilateral lower extremity strength   Time 8   Period Weeks   Status On-going               Plan - 08/16/14 1433    Clinical Impression Statement Pt is a 49 year old male with chronic pain syndrome. Pt  with very limited hamstrings flexibility. Pt will benefit from PT to work on balance activities to prevent future falls and increase strength and endurance   Pt will benefit from skilled therapeutic intervention in order to improve on the following deficits Decreased range of motion;Difficulty walking;Increased muscle spasms;Decreased activity tolerance;Impaired perceived functional ability;Pain;Impaired flexibility;Decreased balance;Decreased mobility;Decreased strength;Impaired sensation   Rehab Potential Good   PT Frequency 2x / week   PT Duration 8 weeks   PT Treatment/Interventions ADLs/Self Care Home Management;Cryotherapy;Electrical Stimulation;Gait training;Ultrasound;Moist Heat;Functional mobility training;Therapeutic activities;Therapeutic exercise;Balance training;Neuromuscular re-education;Manual techniques;Patient/family education;Passive range of motion   PT Next Visit Plan balance, abdominal bracing   PT Home Exercise Plan abdominal bracing   Consulted and Agree with Plan of Care Patient        Problem List Patient Active Problem List   Diagnosis Date Noted  . Poor dentition 07/28/2014  . Chronic maxillary sinusitis 07/28/2014  . Wheezing 07/28/2014  . Vitamin D  insufficiency 07/05/2014  . Closed fracture of 5th metacarpal 07/04/2014  . Chronic fatigue 07/04/2014  . Chronic low back pain 06/02/2014  . Pain in joint, shoulder region 05/12/2014  . Depression 02/08/2014  . Neuropathy 02/08/2014  . Adjustment disorder with depressed mood 02/08/2014  . Alcohol dependence with alcohol-induced mood disorder   . 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 Opioid dependence 01/22/2014  Class: Acute  . Paroxysmal VT 12/19/2013  . Chronic pain syndrome 12/28/2012  . Transaminitis 06/09/2012  . 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 Alcohol abuse 01/19/2012  . Tobacco abuse 01/19/2012  . Hypertension   . Hyperlipidemia     NAUMANN-HOUEGNIFIO,Koehn Salehi PTA 08/16/2014, 2:42 PM  [facility] [address] [place], [place], [address] Phone: [phone]   Fax:  [phone]

## 2014-08-18 ENCOUNTER — Ambulatory Visit: Payer: No Typology Code available for payment source | Admitting: Physical Therapy

## 2014-08-22 ENCOUNTER — Ambulatory Visit: Payer: No Typology Code available for payment source | Admitting: Physical Therapy

## 2014-08-22 ENCOUNTER — Encounter: Payer: Self-pay | Admitting: Physical Therapy

## 2014-08-22 DIAGNOSIS — G894 Chronic pain syndrome: Secondary | ICD-10-CM

## 2014-08-22 DIAGNOSIS — R531 Weakness: Secondary | ICD-10-CM

## 2014-08-22 NOTE — Therapy (Signed)
[facility] [address], [place], [place], [address] Phone: [phone]   Fax:  [phone]  Physical Therapy Treatment  Patient Details  Name: Mark [name] MRN: [id number] Date of Birth: [birth year] Referring Provider:  Boykin Nearing, MD  Encounter Date: 08/22/2014      PT End of Session - 08/22/14 1315    Visit Number 4   Date for PT Re-Evaluation 09/28/14   Authorization Type CAFA   Authorization Time Period 07/13/2014- 01/13/2015   PT Start Time 1230   PT Stop Time 1310   PT Time Calculation (min) 40 min   Activity Tolerance Patient tolerated treatment well   Behavior During Therapy [facility] for tasks assessed/performed      Past Medical History  Diagnosis Date  . 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 ETOH abuse   . Mental disorder   . Irregular heart beat   . Shortness of breath   . Headache(784.0)   . Hepatitis   . Alcohol withdrawal   . Neuropathy 06/07/2013  . 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 Seizure due to alcohol withdrawal 01/23/2014    Per patient report  . Anxiety Dx 2014  . GERD (gastroesophageal reflux disease) Dx 2003  . Hyperlipidemia Dx 2000  . Hypertension Dx 2011  . Withdrawal seizures 2014    Past Surgical History  Procedure Laterality Date  . Back surgery    . Neck surgery    . Fundoplasty transthoracic  2003  . Nasal sinus surgery    . Left heart catheterization with coronary angiogram N/A 01/13/2014    Procedure: LEFT HEART CATHETERIZATION WITH CORONARY ANGIOGRAM;  Surgeon: Clent Demark, MD;  Location: [facility] CATH LAB;  Service: Cardiovascular;  Laterality: N/A;    There were no vitals filed for this visit.  Visit Diagnosis:  Chronic pain syndrome  Weakness      Subjective Assessment - 08/22/14 1233    Subjective No change in pain.  MRI on friday for right shoulder. My feet are bothering me very bad for the past 3-4 days.  Exercises are not helping.  I have more pain after exercise.    Limitations Standing;Walking   How long can you stand comfortably? feels like standing on stumps   How long can you walk comfortably? difficulty with walking due to neuropathy in feet   Patient Stated Goals function and manage pain   Currently in Pain? Yes   Pain Score 8    Pain Location Knee  feet   Pain Orientation Right;Left;Lower   Pain Descriptors / Indicators Constant;Throbbing   Pain Type Chronic pain   Pain Onset More than a month ago   Pain Frequency Constant   Aggravating Factors  standing on feet, activity   Pain Relieving Factors focus on stretching to help reduce pain   Multiple Pain Sites No                         [facility] Adult PT Treatment/Exercise - 08/22/14 0001    Lumbar Exercises: Stretches   Passive Hamstring Stretch 2 reps;30 seconds  bil.    Lumbar Exercises: Standing   Other Standing Lumbar Exercises Tandem stance with holding on 30 seconds 5 times each way.    Lumbar Exercises: Supine   Clam 20 reps  with abdominal bracing   Isometric Hip Flexion 20 reps;5 seconds  bil. knees   Other  Supine Lumbar Exercises supine feet on ball with bil. hip flexion 30 times with abominal bracing; feet on ball and rock side to side contracting the abdominals; feet on ball bringing knees toward chest and open up 20 times   Other Supine Lumbar Exercises hookly press feet into mat 10 seconds 10 times                  PT Short Term Goals - 08/22/14 1303    PT SHORT TERM GOAL #1   Title understand how to perform diaphragmatic breathing to manage his pain while exercise   Time 4   Period Weeks   Status Achieved   PT SHORT TERM GOAL #2   Title understand correct body mechanics with daily tasks to reduce strain on neck and back   Time 4   Period Weeks   Status Achieved   PT SHORT TERM GOAL #3   Title understand what is in the community for exercise programs including aquatics   Time  4   Period Weeks   Status Achieved  filling out paperwork for [employer]           PT Long Term Goals - 08/22/14 1304    PT LONG TERM GOAL #1   Title ability to move with daily tasks with >/= 40% due to increased overall strength and mobility   Time 4   Period Weeks   Status On-going  working on strength   PT LONG TERM GOAL #2   Title understand ways to adjust his living space to make it easier to move around safely   Time 8   Period Weeks   Status On-going   PT LONG TERM GOAL #3   Title tandem stance >/= 30 sec with one foot ahead of other due to improve balance   Time 8   Period Weeks   Status On-going  has to hold on   PT LONG TERM GOAL #4   Title Berg balance score >/= 49/56   Time 8   Period Weeks   Status On-going   PT LONG TERM GOAL #5   Title ability to get up and down from a chair with >/= 25% greater ease due to increased bilateral lower extremity strength   Time 8   Period Weeks   Status On-going  No change in pain               Plan - 08/22/14 1317    Clinical Impression Statement Patient has not met goals today due to increased pain and parasthesias in his feet and lower leg today. Patient is filling out the paperwork to join the [facility] with a scholarship fund.  Patient does not have other options for aquatic  therapy  due to his location. Patient is able to exercise in [place] today due to increased symptoms of his feet. Patient will benefit from physical therapy to improve function.     Pt will benefit from skilled therapeutic intervention in order to improve on the following deficits Decreased range of motion;Difficulty walking;Increased muscle spasms;Decreased activity tolerance;Impaired perceived functional ability;Pain;Impaired flexibility;Decreased balance;Decreased mobility;Decreased strength;Impaired sensation   Rehab Potential Good   PT Frequency 2x / week   PT Duration 8 weeks   PT Treatment/Interventions ADLs/Self Care Home  Management;Cryotherapy;Electrical Stimulation;Gait training;Ultrasound;Moist Heat;Functional mobility training;Therapeutic activities;Therapeutic exercise;Balance training;Neuromuscular re-education;Manual techniques;Patient/family education;Passive range of motion   PT Next Visit Plan balance and core strengthening   PT Home Exercise Plan current HEP   Consulted and Agree  with Plan of Care Patient        Problem List Patient Active Problem List   Diagnosis Date Noted  . Poor dentition 07/28/2014  . Chronic maxillary sinusitis 07/28/2014  . Wheezing 07/28/2014  . Vitamin D insufficiency 07/05/2014  . Closed fracture of 5th metacarpal 07/04/2014  . Chronic fatigue 07/04/2014  . Chronic low back pain 06/02/2014  . Pain in joint, shoulder region 05/12/2014  . Depression 02/08/2014  . Neuropathy 02/08/2014  . Adjustment disorder with depressed mood 02/08/2014  . Alcohol dependence with alcohol-induced mood disorder   . 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 Opioid dependence 01/22/2014    Class: Acute  . Paroxysmal VT 12/19/2013  . Chronic pain syndrome 12/28/2012  . Transaminitis 06/09/2012  . 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 Alcohol abuse 01/19/2012  . Tobacco abuse 01/19/2012  . Hypertension   . Hyperlipidemia     GRAY,CHERYL,PT 08/22/2014, 1:22 PM  [facility] [address] [place], [place], [address] Phone: [phone]   Fax:  [phone]

## 2014-08-24 ENCOUNTER — Ambulatory Visit: Payer: No Typology Code available for payment source | Admitting: Physical Therapy

## 2014-08-24 ENCOUNTER — Encounter: Payer: Self-pay | Admitting: Physical Therapy

## 2014-08-24 DIAGNOSIS — R531 Weakness: Secondary | ICD-10-CM

## 2014-08-24 DIAGNOSIS — G894 Chronic pain syndrome: Secondary | ICD-10-CM

## 2014-08-24 NOTE — Therapy (Signed)
[facility] [address], [place], [place], [address] Phone: [phone]   Fax:  [phone]  Physical Therapy Treatment  Patient Details  Name: Mark [name] MRN: [id number] Date of Birth: 12/03/1965 Referring Provider:  Boykin Nearing, MD  Encounter Date: 08/24/2014      PT End of Session - 08/24/14 1246    Visit Number 5   Date for PT Re-Evaluation 09/28/14   Authorization Type CAFA   Authorization Time Period 07/13/2014- 01/13/2015   PT Start Time 1230   PT Stop Time 1312   PT Time Calculation (min) 42 min   Activity Tolerance Patient tolerated treatment well   Behavior During Therapy [facility] for tasks assessed/performed      Past Medical History  Diagnosis Date  . 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 ETOH abuse   . Mental disorder   . Irregular heart beat   . Shortness of breath   . Headache(784.0)   . Hepatitis   . Alcohol withdrawal   . Neuropathy 06/07/2013  . 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 Seizure due to alcohol withdrawal 01/23/2014    Per patient report  . Anxiety Dx 2014  . GERD (gastroesophageal reflux disease) Dx 2003  . Hyperlipidemia Dx 2000  . Hypertension Dx 2011  . Withdrawal seizures 2014    Past Surgical History  Procedure Laterality Date  . Back surgery    . Neck surgery    . Fundoplasty transthoracic  2003  . Nasal sinus surgery    . Left heart catheterization with coronary angiogram N/A 01/13/2014    Procedure: LEFT HEART CATHETERIZATION WITH CORONARY ANGIOGRAM;  Surgeon: Clent Demark, MD;  Location: [facility] CATH LAB;  Service: Cardiovascular;  Laterality: N/A;    There were no vitals filed for this visit.  Visit Diagnosis:  Chronic pain syndrome  Weakness      Subjective Assessment - 08/24/14 1237    Subjective MRI on Friday for Rt shoulder. All over no change or improvement noted in rest of body   Currently in Pain? Yes   Pain Score 7    Pain Location Knee   Pain Orientation Right;Left;Lower   Pain Descriptors / Indicators Constant;Throbbing   Pain Type Chronic pain   Pain Onset More than a month ago   Pain Frequency Constant   Multiple Pain Sites No                         [facility] Adult PT Treatment/Exercise - 08/24/14 0001    Exercises   Exercises Lumbar   Lumbar Exercises: Stretches   Passive Hamstring Stretch 2 reps;30 seconds   Lumbar Exercises: Aerobic   UBE (Upper Arm Bike) L1 x 4 min (2'/2)    Lumbar Exercises: Standing   Other Standing Lumbar Exercises Tandem stance with holding on 30 seconds 5 times each way.    Lumbar Exercises: Supine   Clam 20 reps   Isometric Hip Flexion 20 reps;5 seconds   Other Supine Lumbar Exercises supine feet on ball with bil. hip flexion 30 times with abominal bracing; feet on ball and rock side to side contracting the abdominals; feet on ball bringing knees toward chest and open up 20 times   Other Supine Lumbar Exercises hookly press feet into mat 10 seconds 10 times  PT Short Term Goals - 08/22/14 1303    PT SHORT TERM GOAL #1   Title understand how to perform diaphragmatic breathing to manage his pain while exercise   Time 4   Period Weeks   Status Achieved   PT SHORT TERM GOAL #2   Title understand correct body mechanics with daily tasks to reduce strain on neck and back   Time 4   Period Weeks   Status Achieved   PT SHORT TERM GOAL #3   Title understand what is in the community for exercise programs including aquatics   Time 4   Period Weeks   Status Achieved  filling out paperwork for [employer]           PT Long Term Goals - 08/22/14 1304    PT LONG TERM GOAL #1   Title ability to move with daily tasks with >/= 40% due to increased overall strength and mobility   Time 4   Period Weeks   Status On-going  working on strength   PT LONG TERM GOAL #2   Title understand ways to adjust his living space  to make it easier to move around safely   Time 8   Period Weeks   Status On-going   PT LONG TERM GOAL #3   Title tandem stance >/= 30 sec with one foot ahead of other due to improve balance   Time 8   Period Weeks   Status On-going  has to hold on   PT LONG TERM GOAL #4   Title Berg balance score >/= 49/56   Time 8   Period Weeks   Status On-going   PT LONG TERM GOAL #5   Title ability to get up and down from a chair with >/= 25% greater ease due to increased bilateral lower extremity strength   Time 8   Period Weeks   Status On-going  No change in pain               Plan - 08/24/14 1247    Clinical Impression Statement Pt with parasthesias in both legs and pain, pt stamina is very limited due to chronic condition unable to incr time on UBE. Pt is planning to join the [facility].    Pt will benefit from skilled therapeutic intervention in order to improve on the following deficits Decreased range of motion;Difficulty walking;Increased muscle spasms;Decreased activity tolerance;Impaired perceived functional ability;Pain;Impaired flexibility;Decreased balance;Decreased mobility;Decreased strength;Impaired sensation   Rehab Potential Good   PT Frequency 2x / week   PT Duration 8 weeks   PT Treatment/Interventions ADLs/Self Care Home Management;Cryotherapy;Electrical Stimulation;Gait training;Ultrasound;Moist Heat;Functional mobility training;Therapeutic activities;Therapeutic exercise;Balance training;Neuromuscular re-education;Manual techniques;Patient/family education;Passive range of motion   PT Next Visit Plan balance and core strengthening   PT Home Exercise Plan current HEP   Consulted and Agree with Plan of Care Patient        Problem List Patient Active Problem List   Diagnosis Date Noted  . Poor dentition 07/28/2014  . Chronic maxillary sinusitis 07/28/2014  . Wheezing 07/28/2014  . Vitamin D insufficiency 07/05/2014  . Closed fracture of 5th metacarpal 07/04/2014   . Chronic fatigue 07/04/2014  . Chronic low back pain 06/02/2014  . Pain in joint, shoulder region 05/12/2014  . Depression 02/08/2014  . Neuropathy 02/08/2014  . Adjustment disorder with depressed mood 02/08/2014  . Alcohol dependence with alcohol-induced mood disorder   . 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 Opioid dependence 01/22/2014    Class: Acute  .  Paroxysmal VT 12/19/2013  . Chronic pain syndrome 12/28/2012  . Transaminitis 06/09/2012  . 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 Alcohol abuse 01/19/2012  . Tobacco abuse 01/19/2012  . Hypertension   . Hyperlipidemia     NAUMANN-HOUEGNIFIO,Justiss Gerbino PTA 08/24/2014, 1:11 PM  [facility] [address], [place], [place], [address] Phone: [phone]   Fax:  [phone]

## 2014-08-26 ENCOUNTER — Inpatient Hospital Stay: Admission: RE | Admit: 2014-08-26 | Payer: No Typology Code available for payment source | Source: Ambulatory Visit

## 2014-08-26 ENCOUNTER — Ambulatory Visit
Admission: RE | Admit: 2014-08-26 | Discharge: 2014-08-26 | Disposition: A | Discharge status: Home / Self Care | Payer: No Typology Code available for payment source | Source: Ambulatory Visit | Attending: Sports Medicine | Admitting: Sports Medicine

## 2014-08-26 ENCOUNTER — Other Ambulatory Visit: Payer: Self-pay | Admitting: Sports Medicine

## 2014-08-26 ENCOUNTER — Ambulatory Visit
Admission: RE | Admit: 2014-08-26 | Discharge: 2014-08-26 | Disposition: A | Discharge status: Home / Self Care | Payer: No Typology Code available for payment source | Source: Ambulatory Visit

## 2014-08-26 DIAGNOSIS — M25511 Pain in right shoulder: Secondary | ICD-10-CM

## 2014-08-26 MED ORDER — IOHEXOL 180 MG/ML  SOLN: 10.0000 mL | Freq: Once | INTRAMUSCULAR | Status: AC | PRN

## 2014-08-29 ENCOUNTER — Ambulatory Visit: Payer: No Typology Code available for payment source | Admitting: Physical Therapy

## 2014-08-29 ENCOUNTER — Encounter: Payer: Self-pay | Admitting: Physical Therapy

## 2014-08-29 DIAGNOSIS — R531 Weakness: Secondary | ICD-10-CM

## 2014-08-29 DIAGNOSIS — G894 Chronic pain syndrome: Secondary | ICD-10-CM
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
## 2014-08-31 ENCOUNTER — Encounter: Payer: Self-pay | Admitting: Physical Therapy

## 2014-08-31 ENCOUNTER — Ambulatory Visit: Payer: No Typology Code available for payment source | Admitting: Physical Therapy

## 2014-08-31 DIAGNOSIS — G894 Chronic pain syndrome: Secondary | ICD-10-CM

## 2014-08-31 DIAGNOSIS — R531 Weakness: Secondary | ICD-10-CM
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
## 2014-09-05 ENCOUNTER — Ambulatory Visit: Payer: No Typology Code available for payment source | Admitting: Physical Therapy

## 2014-09-07 ENCOUNTER — Ambulatory Visit: Payer: No Typology Code available for payment source | Admitting: Physical Therapy

## 2014-09-13 ENCOUNTER — Encounter: Payer: Self-pay | Admitting: *Deleted

## 2014-09-13 ENCOUNTER — Encounter: Payer: Self-pay | Admitting: Physical Therapy

## 2014-09-13 ENCOUNTER — Ambulatory Visit: Payer: No Typology Code available for payment source | Attending: Family Medicine | Admitting: Physical Therapy

## 2014-09-13 DIAGNOSIS — G894 Chronic pain syndrome: Secondary | ICD-10-CM | POA: Insufficient documentation

## 2014-09-13 DIAGNOSIS — R531 Weakness: Secondary | ICD-10-CM | POA: Insufficient documentation

## 2014-09-13 DIAGNOSIS — M25511 Pain in right shoulder: Secondary | ICD-10-CM

## 2014-09-13 NOTE — Therapy (Signed)
[facility] [address] [place], [place], [address] Phone: [phone]   Fax:  [phone]  Physical Therapy Treatment  Patient Details  Name: Mark [name] MRN: [id number] Date of Birth: [birth year] Referring Provider:  Boykin Nearing, MD  Encounter Date: 09/13/2014      PT End of Session - 09/13/14 1428    Visit Number 8   Date for PT Re-Evaluation 09/28/14   Authorization Type CAFA   Authorization Time Period 07/13/2014- 01/13/2015   PT Start Time 1400   PT Stop Time 1428   PT Time Calculation (min) 28 min   Activity Tolerance Patient limited by pain;Other (comment)   Behavior During Therapy WFL for tasks assessed/performed      Past Medical History  Diagnosis Date  . 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 ETOH abuse   . Mental disorder   . Irregular heart beat   . Shortness of breath   . Headache(784.0)   . Hepatitis   . Alcohol withdrawal   . Neuropathy 06/07/2013  . 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 Seizure due to alcohol withdrawal 01/23/2014    Per patient report  . Anxiety Dx 2014  . GERD (gastroesophageal reflux disease) Dx 2003  . Hyperlipidemia Dx 2000  . Hypertension Dx 2011  . Withdrawal seizures 2014    Past Surgical History  Procedure Laterality Date  . Back surgery    . Neck surgery    . Fundoplasty transthoracic  2003  . Nasal sinus surgery    . Left heart catheterization with coronary angiogram N/A 01/13/2014    Procedure: LEFT HEART CATHETERIZATION WITH CORONARY ANGIOGRAM;  Surgeon: Clent Demark, MD;  Location: [facility] CATH LAB;  Service: Cardiovascular;  Laterality: N/A;    There were no vitals filed for this visit.  Visit Diagnosis:  Weakness  Chronic pain syndrome      Subjective Assessment - 09/13/14 1408    Subjective I am not seeing much progress.    Limitations Standing;Walking   How long can you stand comfortably? feels like  standing on stumps   How long can you walk comfortably? difficulty with walking due to neuropathy in feet   Patient Stated Goals function and manage pain   Currently in Pain? Yes   Pain Score 7    Pain Location Back   Pain Orientation Right;Left   Pain Descriptors / Indicators Throbbing;Constant   Pain Type Acute pain   Pain Onset More than a month ago   Pain Frequency Constant   Aggravating Factors  movement   Pain Relieving Factors focus on stretching to help reduce pain   Multiple Pain Sites No            [facility] PT Assessment - 09/13/14 0001    Assessment   Medical Diagnosis G89.4 Chronic pain syndrome   Onset Date/Surgical Date 03/12/98   Prior Therapy yes   Precautions   Precautions Shoulder   Type of Shoulder Precautions tear in right RTC that is being followed by Orthopedic   Prior Function   Level of Independence Independent   Observation/Other Assessments   Focus on Therapeutic Outcomes (FOTO)  65% limitaiton   AROM   Cervical Flexion full   Cervical Extension decreased by 75%   Cervical - Right Side Bend decreased by 50%   Cervical - Left Side Bend decreased by 50%   Cervical - Right Rotation decreased by 25%  Cervical - Left Rotation decreased by 25%   Lumbar Flexion decreased by 50%   Lumbar Extension decreased by 50%   Lumbar - Right Side Bend decreased by 25%   Lumbar - Left Side Bend decreased by 25%   Berg Balance Test   Sit to Stand Able to stand  independently using hands   Standing Unsupported Able to stand safely 2 minutes   Sitting with Back Unsupported but Feet Supported on Floor or Stool Able to sit safely and securely 2 minutes   Stand to Sit Controls descent by using hands   Transfers Able to transfer safely, definite need of hands   Standing Unsupported with Eyes Closed Able to stand 10 seconds safely  wide stance with body sway   Standing Ubsupported with Feet Together Able to place feet together independently and stand 1 minute safely    From Standing, Reach Forward with Outstretched Arm Can reach confidently >25 cm (10")   From Standing Position, Pick up Object from Floor Able to pick up shoe safely and easily   From Standing Position, Turn to Look Behind Over each Shoulder Looks behind from both sides and weight shifts well   Turn 360 Degrees Able to turn 360 degrees safely in 4 seconds or less   Standing Unsupported, Alternately Place Feet on Step/Stool Able to stand independently and safely and complete 8 steps in 20 seconds   Standing Unsupported, One Foot in [employer] balance while stepping or standing   Standing on One Leg Tries to lift leg/unable to hold 3 seconds but remains standing independently   Total Score 46                     [facility] Adult PT Treatment/Exercise - 09/13/14 0001    Lumbar Exercises: Stretches   Passive Hamstring Stretch 3 reps;20 seconds  in sitting each leg   Lower Trunk Rotation 30 seconds;3 reps                PT Education - 09/13/14 1427    Education provided Yes   Education Details reviewed patient past HEP and he is independent   Forensic psychologist) Educated Patient   Methods Explanation;Verbal cues   Comprehension Verbalized understanding;Returned demonstration          PT Short Term Goals - 08/22/14 1303    PT SHORT TERM GOAL #1   Title understand how to perform diaphragmatic breathing to manage his pain while exercise   Time 4   Period Weeks   Status Achieved   PT SHORT TERM GOAL #2   Title understand correct body mechanics with daily tasks to reduce strain on neck and back   Time 4   Period Weeks   Status Achieved   PT SHORT TERM GOAL #3   Title understand what is in the community for exercise programs including aquatics   Time 4   Period Weeks   Status Achieved  filling out paperwork for [employer]           PT Long Term Goals - 09/13/14 1410    PT LONG TERM GOAL #1   Title ability to move with daily tasks with >/= 40% due to increased overall  strength and mobility   Time 4   Period Weeks   Status Not Met   PT LONG TERM GOAL #2   Title understand ways to adjust his living space to make it easier to move around safely   Time 8  Period Weeks   Status Achieved   PT LONG TERM GOAL #3   Title tandem stance >/= 30 sec with one foot ahead of other due to improve balance   Time 8   Period Weeks   Status Not Met  0-3 seconds   PT LONG TERM GOAL #4   Title Berg balance score >/= 49/56   Time 8   Period Weeks   Status Not Met  46/56   PT LONG TERM GOAL #5   Title ability to get up and down from a chair with >/= 25% greater ease due to increased bilateral lower extremity strength   Time 8   Period Weeks   Status Not Met  still has difficulty               Plan - 09/13/14 1428    Clinical Impression Statement Patient is independent with HEP.  Patient Merrilee Jansky balance is [phone] which is the same as the initial evaluation.  Patient FOTO score went from 62% limitaiton to 65% limitation. Patient reports he sees no changes with therapy.  Patient has met one LTG about know how to adjust his living space to make it easier to get around. Patient had a MRI on his right shoulder that showed a tear in the RTC.  Patient is being discharged due to not meeting goals and lack of progress.    Pt will benefit from skilled therapeutic intervention in order to improve on the following deficits Decreased range of motion;Difficulty walking;Increased muscle spasms;Decreased activity tolerance;Impaired perceived functional ability;Pain;Impaired flexibility;Decreased balance;Decreased mobility;Decreased strength;Impaired sensation   Rehab Potential Good   Clinical Impairments Affecting Rehab Potential None   PT Treatment/Interventions ADLs/Self Care Home Management;Cryotherapy;Electrical Stimulation;Gait training;Ultrasound;Moist Heat;Functional mobility training;Therapeutic activities;Therapeutic exercise;Balance training;Neuromuscular  re-education;Manual techniques;Patient/family education;Passive range of motion   PT Next Visit Plan discharge to [place] current HEP   Consulted and Agree with Plan of Care Patient        Problem List Patient Active Problem List   Diagnosis Date Noted  . Poor dentition 07/28/2014  . Chronic maxillary sinusitis 07/28/2014  . Wheezing 07/28/2014  . Vitamin D insufficiency 07/05/2014  . Closed fracture of 5th metacarpal 07/04/2014  . Chronic fatigue 07/04/2014  . Chronic low back pain 06/02/2014  . Pain in joint, shoulder region 05/12/2014  . Depression 02/08/2014  . Neuropathy 02/08/2014  . Adjustment disorder with depressed mood 02/08/2014  . Alcohol dependence with alcohol-induced mood disorder   . 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 Opioid dependence 01/22/2014    Class: Acute  . Paroxysmal VT 12/19/2013  . Chronic pain syndrome 12/28/2012  . Transaminitis 06/09/2012  . 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 Alcohol abuse 01/19/2012  . Tobacco abuse 01/19/2012  . Hypertension   . Hyperlipidemia     Almond Fitzgibbon,PT 09/13/2014, 2:35 PM  [facility] [address], [place], [place], [address] Phone: [phone]   Fax:  [phone]  PHYSICAL THERAPY DISCHARGE SUMMARY  Visits from Start of Care: 8  Current functional level related to goals / functional outcomes: Patient has not met goals due to not making progress.  See above for assessment.    Remaining deficits: Berg balance score is 46/56 and unable to stand on one foot or tandem stance due to decreased balance.    Education / Equipment: HEP  Plan: Patient agrees to discharge.  Patient goals were not met. Patient is being discharged due to  lack of progress.  Thank you for the referral. Earlie Counts, PT 09/13/2014 2:35 PM  ?????

## 2014-09-13 NOTE — Progress Notes (Signed)
Patient ID: Mark [name], male   DOB: 1965/08/10, 49 y.o.   MRN: [id number] Left patient a message about his Ortho referral:  [facility] Dr. Erlinda Hong Thursday July 7th at 230pm [place], [place], [place] [address] Phone: [phone]

## 2014-09-13 NOTE — Patient Instructions (Signed)
[facility] Dr. Erlinda Hong Thursday July 7th at 230pm [place], [place], [place] [address] Phone: [phone]

## 2014-09-15 ENCOUNTER — Encounter: Payer: Self-pay | Admitting: Physical Therapy

## 2014-09-16 ENCOUNTER — Ambulatory Visit: Payer: No Typology Code available for payment source | Attending: Family Medicine | Admitting: Family Medicine

## 2014-09-16 ENCOUNTER — Encounter: Payer: Self-pay | Admitting: Family Medicine

## 2014-09-16 VITALS — BP 105/72 | HR 69 | Temp 98.7°F | Resp 18 | Ht 76.0 in | Wt 220.0 lb

## 2014-09-16 DIAGNOSIS — M545 Low back pain, unspecified: Secondary | ICD-10-CM

## 2014-09-16 DIAGNOSIS — G894 Chronic pain syndrome: Secondary | ICD-10-CM

## 2014-09-16 DIAGNOSIS — R300 Dysuria: Secondary | ICD-10-CM

## 2014-09-16 DIAGNOSIS — G8929 Other chronic pain: Secondary | ICD-10-CM

## 2014-09-16 DIAGNOSIS — R140 Abdominal distension (gaseous): Secondary | ICD-10-CM | POA: Insufficient documentation

## 2014-09-16 DIAGNOSIS — N529 Male erectile dysfunction, unspecified: Secondary | ICD-10-CM

## 2014-09-16 DIAGNOSIS — R110 Nausea: Secondary | ICD-10-CM | POA: Insufficient documentation

## 2014-09-16 DIAGNOSIS — N4281 Prostatodynia syndrome: Secondary | ICD-10-CM

## 2014-09-16 DIAGNOSIS — F1010 Alcohol abuse, uncomplicated: Secondary | ICD-10-CM

## 2014-09-16 DIAGNOSIS — R109 Unspecified abdominal pain: Secondary | ICD-10-CM | POA: Insufficient documentation

## 2014-09-16 LAB — COMPLETE METABOLIC PANEL WITH GFR
ALBUMIN: 4.4 g/dL (ref 3.5–5.2)
ALK PHOS: 85 U/L (ref 39–117)
ALT: 12 U/L (ref 0–53)
AST: 12 U/L (ref 0–37)
BILIRUBIN TOTAL: 0.4 mg/dL (ref 0.2–1.2)
BUN: 11 mg/dL (ref 6–23)
CO2: 28 mEq/L (ref 19–32)
Calcium: 9.3 mg/dL (ref 8.4–10.5)
Chloride: 101 mEq/L (ref 96–112)
Creat: 0.86 mg/dL (ref 0.50–1.35)
GFR, Est Non African American: >89 mL/min
GLUCOSE: 79 mg/dL (ref 70–99)
Potassium: 4.4 mEq/L (ref 3.5–5.3)
Sodium: 140 mEq/L (ref 135–145)
Total Protein: 6.8 g/dL (ref 6.0–8.3)

## 2014-09-16 LAB — CBC
HCT: 43.7 % (ref 39.0–52.0)
HEMOGLOBIN: 15.1 g/dL (ref 13.0–17.0)
MCH: 27.9 pg (ref 26.0–34.0)
MCHC: 34.6 g/dL (ref 30.0–36.0)
MCV: 80.8 fL (ref 78.0–100.0)
MPV: 9.1 fL (ref 8.6–12.4)
PLATELETS: 394 10*3/uL (ref 150–400)
RBC: 5.41 MIL/uL (ref 4.22–5.81)
RDW: 14.5 % (ref 11.5–15.5)
WBC: 10 10*3/uL (ref 4.0–10.5)

## 2014-09-16 LAB — POCT URINALYSIS DIPSTICK
Bilirubin, UA: NEGATIVE
Blood, UA: NEGATIVE
Glucose, UA: NEGATIVE
Ketones, UA: NEGATIVE
Leukocytes, UA: NEGATIVE
Nitrite, UA: NEGATIVE
Protein, UA: NEGATIVE
Spec Grav, UA: 1.02
Urobilinogen, UA: 0.2
pH, UA: 6.5

## 2014-09-16 LAB — GLUCOSE, POCT (MANUAL RESULT ENTRY): POC Glucose: 97 mg/dl (ref 70–99)

## 2014-09-16 LAB — HEMOCCULT GUIAC POC 1CARD (OFFICE): FECAL OCCULT BLD: NEGATIVE

## 2014-09-16 MED ORDER — TAMSULOSIN HCL 0.4 MG PO CAPS: 0.4000 mg | ORAL_CAPSULE | Freq: Every day | ORAL | Status: DC

## 2014-09-16 MED ORDER — ACETAMINOPHEN-CODEINE #3 300-30 MG PO TABS: 1.0000 | ORAL_TABLET | Freq: Two times a day (BID) | ORAL | Status: DC | PRN

## 2014-09-16 NOTE — Patient Instructions (Signed)
Mr. Townley,  Thank you for coming in today  1. Chronic pain: refilled tylenol #3  2. Abdominal distension: Checking labs [place] will be scheduled you will be called Monday with appt details X-ray ordered  3. Prostate tender and slight enlarged: PSA flomax  4. Erectile dysfunction; Checking testosterone level  You will be called with lab results  F/u in 6 weeks for f/u urine issue abdominal distension   Dr. Adrian Blackwater

## 2014-09-16 NOTE — Progress Notes (Signed)
   Subjective:    Patient ID: Mark [name], male    DOB: [birth year], 49 y.o.   MRN: [id number] CC: abdominal pain and bloating, dysuria  HPI 49 yo M presents for f/u visit:  1. Abdominal pain with bloating: for one year or more. Worsening. Having dysuria. Having nausea. No emesis s/p Nissen fundoplication.   2. Dysuria: x one year. Steadily worsening. No urethral discharge. Has frequency during the day with large volume urine. Sometimes dribbling. Has chronic low back pain. No new sex partners. No groin lesions.   3. Chronic low back pain has AM stiffness. Tylenol helping. No leg weakness. No fecal incontinence.   Soc Hx: current smoker  Review of Systems  Constitutional: Negative for fever, chills, fatigue and unexpected weight change.  Eyes: Negative for visual disturbance.  Respiratory: Negative for cough and shortness of breath.   Cardiovascular: Negative for chest pain, palpitations and leg swelling.  Gastrointestinal: Positive for nausea, abdominal pain, abdominal distention, anal bleeding and rectal pain. Negative for vomiting, diarrhea, constipation and blood in stool.  Endocrine: Positive for polyuria. Negative for polydipsia and polyphagia.  Genitourinary: Positive for dysuria, penile pain and testicular pain. Negative for hematuria and discharge.  Musculoskeletal: Positive for back pain and neck pain. Negative for myalgias, arthralgias and gait problem.  Skin: Negative for rash.      Objective:   Physical Exam BP 105/72 mmHg  Pulse 69  Temp(Src) 98.7 F (37.1 C) (Oral)  Resp 18  Ht 6\' 4"  (1.93 m)  Wt 220 lb (99.791 kg)  BMI 26.79 kg/m2  SpO2 95%  Wt Readings from Last 3 Encounters:  09/16/14 220 lb (99.791 kg)  08/10/14 220 lb (99.791 kg)  07/28/14 219 lb (99.338 kg)   BP Readings from Last 3 Encounters:  09/16/14 105/72  08/10/14 134/84  07/28/14 116/86  General appearance: alert, cooperative and no distress Abdomen: round, soft, healed surgical scar,  distension improves with laying supine. Mild TTP RUQ Genital: normal penis and testes, no discharge Rectal: normal tone, FOBT negative, mild TTP and enlargement R lobe,   CBG 97     Assessment & Plan:

## 2014-09-16 NOTE — Assessment & Plan Note (Signed)
Erectile dysfunction in setting of chronic low back pain and hx of ETOH abuse  Checking testosterone level

## 2014-09-16 NOTE — Assessment & Plan Note (Addendum)
  Prostate tender and slight enlarged: PSA flomax

## 2014-09-16 NOTE — Assessment & Plan Note (Signed)
Chronic pain: refilled tylenol #3

## 2014-09-16 NOTE — Progress Notes (Signed)
Referral to pain management  Stomach pain and nausea  Pain with urination and with BM

## 2014-09-16 NOTE — Assessment & Plan Note (Signed)
Abdominal distension: Checking labs [place] will be scheduled you will be called Monday with appt details X-ray ordere

## 2014-09-17 LAB — PSA: PSA: 1.16 ng/mL (ref <=4.00)

## 2014-09-19 ENCOUNTER — Telehealth: Payer: Self-pay | Admitting: *Deleted

## 2014-09-19 ENCOUNTER — Ambulatory Visit: Payer: No Typology Code available for payment source | Admitting: Physical Therapy

## 2014-09-19 NOTE — Telephone Encounter (Signed)
Pt returning call for CMA regarding CT instructions and lab results. Please f/u with pt.

## 2014-09-19 NOTE — Telephone Encounter (Signed)
Left appointment information with pt Mom Mark [name]

## 2014-09-19 NOTE — Telephone Encounter (Signed)
CT appointment at [facility] on September 26, 2014 at 1:30pm arriving 15 min early NPO 6 hr prior   Unable to communicate appointment information. LVM to return call

## 2014-09-21 ENCOUNTER — Ambulatory Visit: Payer: No Typology Code available for payment source | Admitting: Physical Therapy

## 2014-09-22 ENCOUNTER — Other Ambulatory Visit (HOSPITAL_BASED_OUTPATIENT_CLINIC_OR_DEPARTMENT_OTHER): Payer: Self-pay | Admitting: Orthopaedic Surgery

## 2014-09-22 ENCOUNTER — Telehealth: Payer: Self-pay | Admitting: *Deleted

## 2014-09-22 LAB — FSH/LH
FSH: 7.1 m[IU]/mL (ref 1.4–18.1)
LH: 2.1 m[IU]/mL (ref 1.5–9.3)

## 2014-09-22 LAB — TESTOSTERONE, FREE, TOTAL, SHBG
SEX HORMONE BINDING: 29 nmol/L (ref 10–50)
TESTOSTERONE-% FREE: 2.2 % (ref 1.6–2.9)
Testosterone, Free: 78.9 pg/mL (ref 47.0–244.0)
Testosterone: 365 ng/dL (ref 300–890)

## 2014-09-22 NOTE — Telephone Encounter (Signed)
-----   Message from Boykin Nearing, MD sent at 09/22/2014  9:57 AM EDT ----- All labs including testosterone level normal

## 2014-09-22 NOTE — Telephone Encounter (Signed)
LVM to return call.

## 2014-09-26 ENCOUNTER — Ambulatory Visit (HOSPITAL_COMMUNITY)
Admission: RE | Admit: 2014-09-26 | Discharge: 2014-09-26 | Disposition: A | Discharge status: Home / Self Care | Payer: No Typology Code available for payment source | Source: Ambulatory Visit | Attending: Family Medicine | Admitting: Family Medicine

## 2014-09-26 DIAGNOSIS — R140 Abdominal distension (gaseous): Secondary | ICD-10-CM | POA: Insufficient documentation

## 2014-09-28 ENCOUNTER — Telehealth: Payer: Self-pay | Admitting: *Deleted

## 2014-09-28 NOTE — Telephone Encounter (Signed)
-----   Message from Boykin Nearing, MD sent at 09/26/2014  2:33 PM EDT ----- Normal abdominal ultrasound

## 2014-09-28 NOTE — Telephone Encounter (Signed)
LVM to return call.

## 2014-09-29 NOTE — Telephone Encounter (Signed)
LVM x 3 to return call      All labs including testosterone level normal

## 2014-10-03 ENCOUNTER — Telehealth: Payer: Self-pay | Admitting: Family Medicine

## 2014-10-03 DIAGNOSIS — R140 Abdominal distension (gaseous): Secondary | ICD-10-CM

## 2014-10-03 NOTE — Telephone Encounter (Signed)
Patient called returning nurse's call to review results, patient states she was out of town. Please f/u

## 2014-10-03 NOTE — Telephone Encounter (Signed)
Labs and [place] results given to pt Stated still with abdominal swelling.

## 2014-10-06 ENCOUNTER — Encounter: Payer: Self-pay | Admitting: Gastroenterology

## 2014-10-06 NOTE — Telephone Encounter (Signed)
Please inform patient  GI referral placed Any other symptoms like fever, vomiting, diarrhea or blood in stool?

## 2014-10-24 ENCOUNTER — Other Ambulatory Visit: Payer: Self-pay | Admitting: Family Medicine

## 2014-10-24 ENCOUNTER — Telehealth: Payer: Self-pay | Admitting: General Practice

## 2014-10-24 NOTE — Telephone Encounter (Signed)
Patient calling to request medication for acetaminophen-codeine (TYLENOL #3) 300-30 MG per tablet Please assist

## 2014-10-25 NOTE — Telephone Encounter (Signed)
Expand All Collapse All   Patient calling to request medication for acetaminophen-codeine (TYLENOL #3) 300-30 MG per tablet Please assist

## 2014-10-26 NOTE — Telephone Encounter (Signed)
Patient called to request a med refill for Tylenol #3, please f/u with pt.

## 2014-10-26 NOTE — Telephone Encounter (Signed)
Patient called requesting to speak to nurse regarding medication refill for Tylenol #3.Please f/u

## 2014-10-31 NOTE — Telephone Encounter (Signed)
Patient called to request a med refill for Tylenol #3, please f/u with pt.

## 2014-10-31 NOTE — Telephone Encounter (Signed)
Patient called to request a med refill for Tylenol #3, please f/u

## 2014-11-01 NOTE — Telephone Encounter (Signed)
Patient called to request a med refill for Tylenol #3, please f/u

## 2014-11-02 NOTE — Telephone Encounter (Signed)
Patient called to request a med refill for Tylenol #3. Please f/u with pt.

## 2014-11-03 ENCOUNTER — Telehealth: Payer: Self-pay | Admitting: Family Medicine

## 2014-11-03 ENCOUNTER — Encounter: Payer: Self-pay | Admitting: Family Medicine

## 2014-11-03 ENCOUNTER — Ambulatory Visit: Payer: No Typology Code available for payment source | Attending: Family Medicine | Admitting: Family Medicine

## 2014-11-03 VITALS — BP 148/89 | HR 77 | Temp 98.5°F | Resp 16 | Ht 76.0 in | Wt 217.0 lb

## 2014-11-03 DIAGNOSIS — G8929 Other chronic pain: Secondary | ICD-10-CM

## 2014-11-03 DIAGNOSIS — M545 Low back pain: Secondary | ICD-10-CM

## 2014-11-03 DIAGNOSIS — G894 Chronic pain syndrome: Secondary | ICD-10-CM

## 2014-11-03 MED ORDER — ACETAMINOPHEN-CODEINE #3 300-30 MG PO TABS: 1.0000 | ORAL_TABLET | Freq: Two times a day (BID) | ORAL | Status: DC | PRN

## 2014-11-03 NOTE — Telephone Encounter (Signed)
Pt was notified Dr Adrian Blackwater will talk to Dr Erlinda Hong prior to refills

## 2014-11-03 NOTE — Patient Instructions (Signed)
[name],  Thank you for coming in today  Chronic pain management, I will need to discuss the plan with Dr. Erlinda Hong regarding narcotics prior to refilling tylenol #3. If Dr. Erlinda Hong is not planning to prescribe additional pain medicine until the post-op period, I will refill tylenol #3.    In the future, please inform me if you receive an prescription for pain medicine as I will need to update your medications in your chart.  I have placed another referral to pain management.   F/u in 3 months for chronic pain   Dr. Adrian Blackwater

## 2014-11-03 NOTE — Progress Notes (Signed)
   Subjective:    Patient ID: Mark [name], male    DOB: 10-Feb-1966, 49 y.o.   MRN: [id number] CC: chronic back pain, pain medicine refill  HPI 49 yo M with hx of chronic low back pain and R shoulder pain presents for pain medicine refill. Patient is due for tylenol #3. However, he admits to receiving vicodin from his orthopedic Dr. Erlinda [name] and not previously reporting it. Has has received 20-30 pills from Dr. Erlinda [name] on 3 occassions per [place] on 09/15/14, 10/04/14 and 10/17/14. He reports that Dr. Erlinda [name] is treating his R shoulder pain with plan for surgery. He reports that Dr. Erlinda [name] will not refill vicodin until the post-op period.   Social History  Substance Use Topics  . Smoking status: Current Every Day Smoker -- 0.50 packs/day for 30 years    Types: Cigarettes  . Smokeless tobacco: Never Used  . Alcohol Use: Yes     Comment: occasionally    Review of Systems  Constitutional: Negative for fever, chills, fatigue and unexpected weight change.  Gastrointestinal: Negative for vomiting.  Musculoskeletal: Positive for myalgias, back pain, arthralgias and gait problem.  Allergic/Immunologic: Negative for immunocompromised state.  Hematological: Does not bruise/bleed easily.  Psychiatric/Behavioral: Negative for suicidal ideas and dysphoric mood. The patient is not nervous/anxious.        Objective:   Physical Exam  Constitutional: He appears well-developed and well-nourished. No distress.  Neck: Neck supple.  Pulmonary/Chest: Effort normal.  Neurological: He is alert.  Skin: Skin is warm and dry. No rash noted. No erythema.  Psychiatric: He has a normal mood and affect.  BP 148/89 mmHg  Pulse 77  Temp(Src) 98.5 F (36.9 C)  Resp 16  Ht 6\' 4"  (1.93 m)  Wt 217 lb (98.431 kg)  BMI 26.43 kg/m2  SpO2 100%       Assessment & Plan:  I have called Dr. Erlinda [name] office at [facility] and will await follow up call prior to refilling tylenol #3. Tylenol #3 will only be refilled if Dr. Erlinda [name] is not  planning to continue vicodin. Patient is aware. Patient  Is also aware that he is to call and update med list when receiving medications from other provideres.

## 2014-11-03 NOTE — Telephone Encounter (Signed)
Received call from Dr. Erlinda Hong Dr. Erlinda Hong was not aware that patient was prescribed tylenol #3   Plan:  Continue tylenol #3 for now vicodin per Dr. Erlinda Hong for up to 3 months post-op, then Tylenol #3 again from me   Tylenol #3 refilled, please inform patient

## 2014-11-03 NOTE — Progress Notes (Signed)
Patient complains of having neuropathy in both legs  Patient states the pain has now traveled up to his knee area Patient also complains of having muscle pain to bilateral legs that  Seems to be worse at night Patient also requesting a refill on tylenol #3

## 2014-11-03 NOTE — Assessment & Plan Note (Signed)
  Chronic pain management, I will need to discuss the plan with Dr. Erlinda Hong regarding narcotics prior to refilling tylenol #3. If Dr. Erlinda Hong is not planning to prescribe additional pain medicine until the post-op period, I will refill tylenol #3.    In the future, please inform me if you receive an prescription for pain medicine as I will need to update your medications in your chart.  I have placed another referral to pain management.

## 2014-11-03 NOTE — Telephone Encounter (Signed)
Give pt information, Rx tylenol #3 at front office   Vidodine per Dr Erlinda Hong for up to 3 month post op Then Tylenol #3 from Dr Adrian Blackwater Pt verbalized understanding will pick up Rx tomorrow

## 2014-11-07 ENCOUNTER — Telehealth: Payer: Self-pay | Admitting: Clinical

## 2014-11-07 NOTE — Telephone Encounter (Signed)
Pt returned call, and will come in

## 2014-11-07 NOTE — Telephone Encounter (Signed)
Left HIPPA-compliant message for pt to return call to [facility] at [facility] at [phone].

## 2014-11-09 ENCOUNTER — Encounter (HOSPITAL_BASED_OUTPATIENT_CLINIC_OR_DEPARTMENT_OTHER): Payer: Self-pay | Admitting: *Deleted

## 2014-11-16 ENCOUNTER — Ambulatory Visit (HOSPITAL_BASED_OUTPATIENT_CLINIC_OR_DEPARTMENT_OTHER): Payer: No Typology Code available for payment source | Admitting: Certified Registered"

## 2014-11-16 ENCOUNTER — Ambulatory Visit (HOSPITAL_BASED_OUTPATIENT_CLINIC_OR_DEPARTMENT_OTHER): Payer: Self-pay | Admitting: Certified Registered"

## 2014-11-16 ENCOUNTER — Ambulatory Visit (HOSPITAL_BASED_OUTPATIENT_CLINIC_OR_DEPARTMENT_OTHER)
Admission: RE | Admit: 2014-11-16 | Discharge: 2014-11-16 | Disposition: A | Discharge status: Home / Self Care | Payer: Self-pay | Source: Ambulatory Visit | Attending: Orthopaedic Surgery | Admitting: Orthopaedic Surgery

## 2014-11-16 ENCOUNTER — Encounter (HOSPITAL_BASED_OUTPATIENT_CLINIC_OR_DEPARTMENT_OTHER): Payer: Self-pay | Admitting: Certified Registered"

## 2014-11-16 ENCOUNTER — Encounter (HOSPITAL_BASED_OUTPATIENT_CLINIC_OR_DEPARTMENT_OTHER)
Admission: RE | Disposition: A | Discharge status: Home / Self Care | Payer: Self-pay | Source: Ambulatory Visit | Attending: Orthopaedic Surgery

## 2014-11-16 DIAGNOSIS — Z7982 Long term (current) use of aspirin: Secondary | ICD-10-CM | POA: Insufficient documentation

## 2014-11-16 DIAGNOSIS — M7551 Bursitis of right shoulder: Secondary | ICD-10-CM | POA: Insufficient documentation

## 2014-11-16 DIAGNOSIS — J449 Chronic obstructive pulmonary disease, unspecified: Secondary | ICD-10-CM | POA: Insufficient documentation

## 2014-11-16 DIAGNOSIS — Z888 Allergy status to other drugs, medicaments and biological substances status: Secondary | ICD-10-CM | POA: Insufficient documentation

## 2014-11-16 DIAGNOSIS — I10 Essential (primary) hypertension: Secondary | ICD-10-CM | POA: Insufficient documentation

## 2014-11-16 DIAGNOSIS — F17210 Nicotine dependence, cigarettes, uncomplicated: Secondary | ICD-10-CM | POA: Insufficient documentation

## 2014-11-16 DIAGNOSIS — M75101 Unspecified rotator cuff tear or rupture of right shoulder, not specified as traumatic: Secondary | ICD-10-CM | POA: Insufficient documentation

## 2014-11-16 DIAGNOSIS — E785 Hyperlipidemia, unspecified: Secondary | ICD-10-CM | POA: Insufficient documentation

## 2014-11-16 DIAGNOSIS — F329 Major depressive disorder, single episode, unspecified: Secondary | ICD-10-CM | POA: Insufficient documentation

## 2014-11-16 DIAGNOSIS — M65821 Other synovitis and tenosynovitis, right upper arm: Secondary | ICD-10-CM | POA: Insufficient documentation

## 2014-11-16 DIAGNOSIS — I499 Cardiac arrhythmia, unspecified: Secondary | ICD-10-CM | POA: Insufficient documentation

## 2014-11-16 DIAGNOSIS — G629 Polyneuropathy, unspecified: Secondary | ICD-10-CM | POA: Insufficient documentation

## 2014-11-16 DIAGNOSIS — F419 Anxiety disorder, unspecified: Secondary | ICD-10-CM | POA: Insufficient documentation

## 2014-11-16 DIAGNOSIS — M7541 Impingement syndrome of right shoulder: Secondary | ICD-10-CM | POA: Insufficient documentation

## 2014-11-16 DIAGNOSIS — K219 Gastro-esophageal reflux disease without esophagitis: Secondary | ICD-10-CM | POA: Insufficient documentation

## 2014-11-16 HISTORY — PX: SUBACROMIAL DECOMPRESSION: SHX5174

## 2014-11-16 HISTORY — DX: Chronic obstructive pulmonary disease, unspecified: J44.9

## 2014-11-16 LAB — POCT HEMOGLOBIN-HEMACUE: Hemoglobin: 15.7 g/dL (ref 13.0–17.0)

## 2014-11-16 SURGERY — DECOMPRESSION, SUBACROMIAL SPACE
Anesthesia: Regional | Site: Shoulder | Laterality: Right

## 2014-11-16 MED ORDER — FENTANYL CITRATE (PF) 100 MCG/2ML IJ SOLN
50.0000 ug | INTRAMUSCULAR | Status: DC | PRN
  Administered 2014-11-16: 50 ug via INTRAVENOUS
  Administered 2014-11-16: 100 ug via INTRAVENOUS

## 2014-11-16 MED ORDER — GLYCOPYRROLATE 0.2 MG/ML IJ SOLN: 0.2000 mg | Freq: Once | INTRAMUSCULAR | Status: DC | PRN

## 2014-11-16 MED ORDER — PROPOFOL 10 MG/ML IV BOLUS
INTRAVENOUS | Status: DC | PRN
  Administered 2014-11-16: 200 mg via INTRAVENOUS

## 2014-11-16 MED ORDER — MIDAZOLAM HCL 2 MG/2ML IJ SOLN
INTRAMUSCULAR | Status: AC
  Filled 2014-11-16: qty 2

## 2014-11-16 MED ORDER — PROMETHAZINE HCL 25 MG/ML IJ SOLN: 6.2500 mg | INTRAMUSCULAR | Status: DC | PRN

## 2014-11-16 MED ORDER — MIDAZOLAM HCL 2 MG/2ML IJ SOLN
INTRAMUSCULAR | Status: AC
  Filled 2014-11-16: qty 4

## 2014-11-16 MED ORDER — FENTANYL CITRATE (PF) 100 MCG/2ML IJ SOLN
INTRAMUSCULAR | Status: AC
Start: 2014-11-16 — End: 2014-11-16
  Filled 2014-11-16: qty 2

## 2014-11-16 MED ORDER — LIDOCAINE HCL (CARDIAC) 20 MG/ML IV SOLN
INTRAVENOUS | Status: DC | PRN
  Administered 2014-11-16: 30 mg via INTRAVENOUS

## 2014-11-16 MED ORDER — LIDOCAINE HCL (CARDIAC) 20 MG/ML IV SOLN
INTRAVENOUS | Status: AC
Start: 2014-11-16 — End: 2014-11-16
  Filled 2014-11-16: qty 5

## 2014-11-16 MED ORDER — MIDAZOLAM HCL 2 MG/2ML IJ SOLN
1.0000 mg | INTRAMUSCULAR | Status: DC | PRN
  Administered 2014-11-16 (×2): 2 mg via INTRAVENOUS

## 2014-11-16 MED ORDER — CEFAZOLIN SODIUM-DEXTROSE 2-3 GM-% IV SOLR
INTRAVENOUS | Status: AC
Start: 2014-11-16 — End: 2014-11-16
  Filled 2014-11-16: qty 50

## 2014-11-16 MED ORDER — LACTATED RINGERS IV SOLN
INTRAVENOUS | Status: DC
  Administered 2014-11-16 (×2): via INTRAVENOUS

## 2014-11-16 MED ORDER — DEXAMETHASONE SODIUM PHOSPHATE 4 MG/ML IJ SOLN
INTRAMUSCULAR | Status: DC | PRN
  Administered 2014-11-16: 10 mg via INTRAVENOUS

## 2014-11-16 MED ORDER — PROPOFOL 500 MG/50ML IV EMUL
INTRAVENOUS | Status: AC
  Filled 2014-11-16: qty 50

## 2014-11-16 MED ORDER — SCOPOLAMINE 1 MG/3DAYS TD PT72: 1.0000 | MEDICATED_PATCH | Freq: Once | TRANSDERMAL | Status: DC | PRN

## 2014-11-16 MED ORDER — OXYCODONE-ACETAMINOPHEN 5-325 MG PO TABS: 1.0000 | ORAL_TABLET | ORAL | Status: DC | PRN

## 2014-11-16 MED ORDER — BUPIVACAINE-EPINEPHRINE (PF) 0.25% -1:200000 IJ SOLN
INTRAMUSCULAR | Status: AC
  Filled 2014-11-16: qty 30

## 2014-11-16 MED ORDER — BUPIVACAINE-EPINEPHRINE (PF) 0.5% -1:200000 IJ SOLN
INTRAMUSCULAR | Status: DC | PRN
  Administered 2014-11-16: 30 mL via PERINEURAL

## 2014-11-16 MED ORDER — FENTANYL CITRATE (PF) 100 MCG/2ML IJ SOLN
INTRAMUSCULAR | Status: AC
  Filled 2014-11-16: qty 4

## 2014-11-16 MED ORDER — SUCCINYLCHOLINE CHLORIDE 20 MG/ML IJ SOLN
INTRAMUSCULAR | Status: DC | PRN
  Administered 2014-11-16: 120 mg via INTRAVENOUS

## 2014-11-16 MED ORDER — HYDROMORPHONE HCL 1 MG/ML IJ SOLN: 0.2500 mg | INTRAMUSCULAR | Status: DC | PRN

## 2014-11-16 MED ORDER — SODIUM CHLORIDE 0.9 % IR SOLN
Status: DC | PRN
  Administered 2014-11-16: 6000 mL

## 2014-11-16 MED ORDER — OXYCODONE-ACETAMINOPHEN 5-325 MG PO TABS
ORAL_TABLET | ORAL | Status: AC
Start: 2014-11-16 — End: 2014-11-16
  Filled 2014-11-16: qty 1

## 2014-11-16 MED ORDER — SENNOSIDES-DOCUSATE SODIUM 8.6-50 MG PO TABS: 1.0000 | ORAL_TABLET | Freq: Every evening | ORAL | Status: DC | PRN

## 2014-11-16 MED ORDER — OXYCODONE-ACETAMINOPHEN 5-325 MG PO TABS
1.0000 | ORAL_TABLET | ORAL | Status: DC | PRN
  Administered 2014-11-16: 1 via ORAL

## 2014-11-16 MED ORDER — ONDANSETRON HCL 4 MG/2ML IJ SOLN
INTRAMUSCULAR | Status: DC | PRN
Start: 2014-11-16 — End: 2014-11-16
  Administered 2014-11-16: 4 mg via INTRAVENOUS

## 2014-11-16 MED ORDER — MEPERIDINE HCL 25 MG/ML IJ SOLN: 6.2500 mg | INTRAMUSCULAR | Status: DC | PRN

## 2014-11-16 MED ORDER — SUCCINYLCHOLINE CHLORIDE 20 MG/ML IJ SOLN
INTRAMUSCULAR | Status: AC
Start: 2014-11-16 — End: 2014-11-16
  Filled 2014-11-16: qty 1

## 2014-11-16 MED ORDER — ONDANSETRON HCL 4 MG/2ML IJ SOLN
INTRAMUSCULAR | Status: AC
  Filled 2014-11-16: qty 2

## 2014-11-16 MED ORDER — DEXAMETHASONE SODIUM PHOSPHATE 10 MG/ML IJ SOLN
INTRAMUSCULAR | Status: AC
Start: 2014-11-16 — End: 2014-11-16
  Filled 2014-11-16: qty 1

## 2014-11-16 MED ORDER — CEFAZOLIN SODIUM-DEXTROSE 2-3 GM-% IV SOLR
2.0000 g | INTRAVENOUS | Status: AC
  Administered 2014-11-16: 2 g via INTRAVENOUS

## 2014-11-16 SURGICAL SUPPLY — 82 items
ANCHOR SUT BIO SW 4.75X19.1 (Anchor) ×4 IMPLANT
BENZOIN TINCTURE PRP APPL 2/3 (GAUZE/BANDAGES/DRESSINGS) IMPLANT
BLADE 4.2CUDA (BLADE) ×4 IMPLANT
BLADE CLIPPER SENSICLIP SURGIC (BLADE) ×4 IMPLANT
BLADE CUTTER GATOR 3.5 (BLADE) IMPLANT
BLADE GREAT WHITE 4.2 (BLADE) IMPLANT
BLADE GREAT WHITE 4.2MM (BLADE)
BLADE SURG 15 STRL LF DISP TIS (BLADE) IMPLANT
BLADE SURG 15 STRL SS (BLADE)
BUR OVAL 4.0 (BURR) ×4 IMPLANT
CANNULA 5.75X71 LONG (CANNULA) ×8 IMPLANT
CANNULA TWIST IN 8.25X7CM (CANNULA) ×4 IMPLANT
CLOSURE STERI-STRIP 1/2X4 (GAUZE/BANDAGES/DRESSINGS) ×1
CLOSURE WOUND 1/2 X4 (GAUZE/BANDAGES/DRESSINGS)
CLSR STERI-STRIP ANTIMIC 1/2X4 (GAUZE/BANDAGES/DRESSINGS) ×3 IMPLANT
DECANTER SPIKE VIAL GLASS SM (MISCELLANEOUS) IMPLANT
DERMABOND ADVANCED (GAUZE/BANDAGES/DRESSINGS) ×2
DERMABOND ADVANCED .7 DNX12 (GAUZE/BANDAGES/DRESSINGS) ×2 IMPLANT
DRAPE INCISE IOBAN 66X45 STRL (DRAPES) ×4 IMPLANT
DRAPE STERI 35X30 U-POUCH (DRAPES) ×4 IMPLANT
DRAPE U 20/CS (DRAPES) ×4 IMPLANT
DRAPE U-SHAPE 47X51 STRL (DRAPES) ×8 IMPLANT
DRAPE U-SHAPE 76X120 STRL (DRAPES) ×8 IMPLANT
DRSG PAD ABDOMINAL 8X10 ST (GAUZE/BANDAGES/DRESSINGS) ×4 IMPLANT
DURAPREP 26ML APPLICATOR (WOUND CARE) ×4 IMPLANT
ELECT REM PT RETURN 9FT ADLT (ELECTROSURGICAL) ×4
ELECTRODE REM PT RTRN 9FT ADLT (ELECTROSURGICAL) ×2 IMPLANT
GAUZE SPONGE 4X4 12PLY STRL (GAUZE/BANDAGES/DRESSINGS) ×4 IMPLANT
GAUZE XEROFORM 1X8 LF (GAUZE/BANDAGES/DRESSINGS) ×4 IMPLANT
GLOVE BIO SURGEON STRL SZ 6.5 (GLOVE) ×6 IMPLANT
GLOVE BIO SURGEONS STRL SZ 6.5 (GLOVE) ×2
GLOVE BIOGEL M STRL SZ7.5 (GLOVE) ×4 IMPLANT
GLOVE BIOGEL PI IND STRL 7.0 (GLOVE) ×4 IMPLANT
GLOVE BIOGEL PI IND STRL 8 (GLOVE) ×2 IMPLANT
GLOVE BIOGEL PI INDICATOR 7.0 (GLOVE) ×4
GLOVE BIOGEL PI INDICATOR 8 (GLOVE) ×2
GLOVE NEODERM STRL 7.5 LF PF (GLOVE) ×2 IMPLANT
GLOVE SURG NEODERM 7.5  LF PF (GLOVE) ×2
GLOVE SURG SS PI 7.5 STRL IVOR (GLOVE) ×4 IMPLANT
GLOVE SURG SYN 7.5  E (GLOVE) ×2
GLOVE SURG SYN 7.5 E (GLOVE) ×2 IMPLANT
GOWN PREVENTION PLUS XLARGE (GOWN DISPOSABLE) ×4 IMPLANT
GOWN STRL REUS W/ TWL LRG LVL3 (GOWN DISPOSABLE) ×2 IMPLANT
GOWN STRL REUS W/TWL LRG LVL3 (GOWN DISPOSABLE) ×2
GOWN STRL REUS W/TWL XL LVL3 (GOWN DISPOSABLE) ×8 IMPLANT
IMMOBILIZER SHOULDER FOAM XLGE (SOFTGOODS) IMPLANT
KIT BIO-TENODESIS 3X8 DISP (MISCELLANEOUS) ×2
KIT INSRT BABSR STRL DISP BTN (MISCELLANEOUS) ×2 IMPLANT
KIT SHOULDER TRACTION (DRAPES) ×4 IMPLANT
MANIFOLD NEPTUNE II (INSTRUMENTS) ×4 IMPLANT
NEEDLE SCORPION MULTI FIRE (NEEDLE) ×4 IMPLANT
PACK ARTHROSCOPY DSU (CUSTOM PROCEDURE TRAY) ×4 IMPLANT
PACK BASIN DAY SURGERY FS (CUSTOM PROCEDURE TRAY) ×4 IMPLANT
PAD ORTHO SHOULDER 7X19 LRG (SOFTGOODS) ×4 IMPLANT
SCREW PEEK TENODESIS 8X23M (Screw) ×4 IMPLANT
SET ARTHROSCOPY TUBING (MISCELLANEOUS) ×2
SET ARTHROSCOPY TUBING LN (MISCELLANEOUS) ×2 IMPLANT
SLEEVE SCD COMPRESS KNEE MED (MISCELLANEOUS) ×4 IMPLANT
SLING ARM IMMOBILIZER LRG (SOFTGOODS) IMPLANT
SLING ARM IMMOBILIZER MED (SOFTGOODS) IMPLANT
SLING ARM LRG ADULT FOAM STRAP (SOFTGOODS) IMPLANT
SLING ARM MED ADULT FOAM STRAP (SOFTGOODS) IMPLANT
SLING ARM XL FOAM STRAP (SOFTGOODS) IMPLANT
SPONGE GAUZE 4X4 12PLY STER LF (GAUZE/BANDAGES/DRESSINGS) ×4 IMPLANT
STRIP CLOSURE SKIN 1/2X4 (GAUZE/BANDAGES/DRESSINGS) IMPLANT
SUT ETHILON 3 0 PS 1 (SUTURE) ×4 IMPLANT
SUT FIBERWIRE #2 38 T-5 BLUE (SUTURE)
SUT MNCRL AB 4-0 PS2 18 (SUTURE) ×4 IMPLANT
SUT PDS AB 1 CT  36 (SUTURE)
SUT PDS AB 1 CT 36 (SUTURE) IMPLANT
SUT TIGER TAPE 7 IN WHITE (SUTURE) IMPLANT
SUT VIC AB 2-0 CT1 27 (SUTURE) ×2
SUT VIC AB 2-0 CT1 TAPERPNT 27 (SUTURE) ×2 IMPLANT
SUT VIC AB 2-0 SH 27 (SUTURE) ×2
SUT VIC AB 2-0 SH 27XBRD (SUTURE) ×2 IMPLANT
SUTURE FIBERWR #2 38 T-5 BLUE (SUTURE) IMPLANT
TOWEL OR 17X24 6PK STRL BLUE (TOWEL DISPOSABLE) ×4 IMPLANT
TOWEL OR NON WOVEN STRL DISP B (DISPOSABLE) ×4 IMPLANT
TUBE CONNECTING 20'X1/4 (TUBING)
TUBE CONNECTING 20X1/4 (TUBING) IMPLANT
WAND STAR VAC 90 (SURGICAL WAND) ×4 IMPLANT
WATER STERILE IRR 1000ML POUR (IV SOLUTION) ×4 IMPLANT

## 2014-11-16 NOTE — Discharge Instructions (Signed)
Postoperative instructions:  Weightbearing: Non weight bearing  Keep your dressing and/or splint clean and dry at all times.  You can remove your dressing on post-operative day #3 and change with a dry/sterile dressing or Band-Aids as needed thereafter.  Remain in the sling at all times.  Incision instructions:  Do not soak your incision for 3 weeks after surgery.  If the incision gets wet, pat dry and do not scrub the incision.  Pain control:  You have been given a prescription to be taken as directed for post-operative pain control.  In addition, elevate the operative extremity above the heart at all times to prevent swelling and throbbing pain.  Take over-the-counter Colace, 100mg  by mouth twice a day while taking narcotic pain medications to help prevent constipation.  Follow up appointments: 1) 10-14 days for suture removal and wound check. 2) Dr. Erlinda Hong as scheduled.   -------------------------------------------------------------------------------------------------------------  After Surgery Pain Control:  After your surgery, post-surgical discomfort or pain is likely. This discomfort can last several days to a few weeks. At certain times of the day your discomfort may be more intense.  Did you receive a nerve block?  A nerve block can provide pain relief for one hour to two days after your surgery. As long as the nerve block is working, you will experience little or no sensation in the area the surgeon operated on.  As the nerve block wears off, you will begin to experience pain or discomfort. It is very important that you begin taking your prescribed pain medication before the nerve block fully wears off. Treating your pain at the first sign of the block wearing off will ensure your pain is better controlled and more tolerable when full-sensation returns. Do not wait until the pain is intolerable, as the medicine will be less effective. It is better to treat pain in advance than to try and  catch up.  General Anesthesia:  If you did not receive a nerve block during your surgery, you will need to start taking your pain medication shortly after your surgery and should continue to do so as prescribed by your surgeon.  Pain Medication:  Most commonly we prescribe Vicodin and Percocet for post-operative pain. Both of these medications contain a combination of acetaminophen (Tylenol) and a narcotic to help control pain.   It takes between 30 and 45 minutes before pain medication starts to work. It is important to take your medication before your pain level gets too intense.   Nausea is a common side effect of many pain medications. You will want to eat something before taking your pain medicine to help prevent nausea.   If you are taking a prescription pain medication that contains acetaminophen, we recommend that you do not take additional over the counter acetaminophen (Tylenol).  Other pain relieving options:   Using a cold pack to ice the affected area a few times a day (15 to 20 minutes at a time) can help to relieve pain, reduce swelling and bruising.   Elevation of the affected area can also help to reduce pain and swelling.     Post Anesthesia Home Care Instructions  Activity: Get plenty of rest for the remainder of the day. A responsible adult should stay with you for 24 hours following the procedure.  For the next 24 hours, DO NOT: -Drive a car -Paediatric nurse -Drink alcoholic beverages -Take any medication unless instructed by your physician -Make any legal decisions or sign important papers.  Meals: Start with  liquid foods such as gelatin or soup. Progress to regular foods as tolerated. Avoid greasy, spicy, heavy foods. If nausea and/or vomiting occur, drink only clear liquids until the nausea and/or vomiting subsides. Call your physician if vomiting continues.  Special Instructions/Symptoms: Your throat may feel dry or sore from the anesthesia or the  breathing tube placed in your throat during surgery. If this causes discomfort, gargle with warm salt water. The discomfort should disappear within 24 hours.  If you had a scopolamine patch placed behind your ear for the management of post- operative nausea and/or vomiting:  1. The medication in the patch is effective for 72 hours, after which it should be removed.  Wrap patch in a tissue and discard in the trash. Wash hands thoroughly with soap and water. 2. You may remove the patch earlier than 72 hours if you experience unpleasant side effects which may include dry mouth, dizziness or visual disturbances. 3. Avoid touching the patch. Wash your hands with soap and water after contact with the patch.    Regional Anesthesia Blocks  1. Numbness or the inability to move the "blocked" extremity may last from 3-48 hours after placement. The length of time depends on the medication injected and your individual response to the medication. If the numbness is not going away after 48 hours, call your surgeon.  2. The extremity that is blocked will need to be protected until the numbness is gone and the  Strength has returned. Because you cannot feel it, you will need to take extra care to avoid injury. Because it may be weak, you may have difficulty moving it or using it. You may not know what position it is in without looking at it while the block is in effect.  3. For blocks in the legs and feet, returning to weight bearing and walking needs to be done carefully. You will need to wait until the numbness is entirely gone and the strength has returned. You should be able to move your leg and foot normally before you try and bear weight or walk. You will need someone to be with you when you first try to ensure you do not fall and possibly risk injury.  4. Bruising and tenderness at the needle site are common side effects and will resolve in a few days.  5. Persistent numbness or new problems with movement  should be communicated to the surgeon or the [place] [phone] [place] [phone]).

## 2014-11-16 NOTE — Op Note (Signed)
Date of surgery 11/16/2014  Preoperative diagnoses: 1. Right rotator cuff tear 2. Right long head biceps tenosynovitis and tear 3. Right shoulder impingement syndrome  Postoperative diagnosis: Same  Operative findings: 1. Right shoulder crescent rotator cuff tear 1 x 1 cm 2. Right type II acromion 3. Right shoulder long head biceps tear 4. Right shoulder extensive synovitis and subacromial bursitis  Procedure: 1. Right shoulder arthroscopic rotator cuff repair 2. Right mini open subpectoralis biceps tenodesis 3. Right shoulder arthroscopy with extensive debridement 4. Right shoulder arthroscopic subacromial decompression with acromioplasty  Implants: 1. Arthrex 4.75 mm bio composite swivel lock 2. Arthrex 8 x 23 mm biceps tenodesis screw  Surgeon: Eduard Roux, M.D.  Anesthesia: Regional and general  Estimated blood loss: Minimal  Complications: None  Condition to PACU: Stable  Indications for procedure: [name] is a 49 year old gentleman who presents today for surgical treatment of the above-mentioned conditions after failing extensive conservative treatment. He was aware of the risks, benefits, and alternatives to surgery and he wished to proceed with surgical treatment. Informed consent was obtained and signed.  Description of procedure: The patient was identified in the preoperative holding area. The operative site was marked by the surgeon confirmed with the patient. He is brought back to the operating room. He was placed supine on the table. General anesthesia was administered. He was then placed in the beachchair position. The operative extremity was prepped and draped in standard sterile fashion. A timeout was performed. Preoperative antibiotics were given. The standard posterior and anterior shoulder arthroscopy portals were established. Once established we first performed a diagnostic arthroscopy of the shoulder. There we visualized a degenerative biceps tendon  with a longitudinal split. There was no chondromalacia of the glenohumeral joint. We were not able to visualize any articular sided rotator cuff tears. There was extensive synovitis. Extensive debridement of the synovitis of the shoulder joint was performed with an oscillating shaver. The biceps was tenotomized arthroscopically for later tenodesis. Once we had adequate debridement we then moved into the subacromial space. 2 additional lateral portals were established in order to gain adequate visualization. We performed extensive subacromial decompression with bursectomy and acromioplasty using oscillating shaver, high-speed burr, and ArthroCare wand. Once we had adequate debridement and decompression of this region we were able to visualize a 1 x 1 cm crescent-shaped rotator cuff tear at the distal portion of the supraspinatus tendon. There was good mobilization of the cuff. The footprint of the rotator cuff was prepared using oscillating shaver and high-speed burr. We then repaired the rotator cuff arthroscopically with Tiger tape in a horizontal mattress fashion using a 4.75 mm bio composite swivel lock screw. We were able to bring the rotator cuff laterally back down to its native footprint. After the rotator cuff repair we then turned our attention to the biceps tenodesis. A longitudinal incision in the axillary crease was used. Blunt dissection was taken down to the level of the fascia. The inferior border of the pectoralis major muscle was identified. This was then retracted superiorly and the long head of the biceps was identified.  The long head was then brought through the axillary incision. 30 mm of the biceps tendon was whipstitched starting from the musculotendinous junction working distally. Excess tendon was then excised. We then drilled a 8 mm unicortical hole in the proximal humerus. A tenodesis screw was then used to perform the tenodesis of the long head of the biceps down into the previously  drilled hole. This was then further  reinforced by tying additional knots over the tenodesis screw. We then thoroughly irrigated all the wounds. Final arthroscopic pictures were taken. The arthroscopic incisions were closed with interrupted 3-0 nylon sutures. The axillary incision was closed in layer fashion using 2-0 Vicryls and a running 4-0 Monocryl.  Steri-Strips were applied. Sterile dressings were also then applied. The shoulder was immobilized in a shoulder immobilizer. The patient tolerated the procedure well and was extubated and transferred to the PACU in stable condition. All sponge counts were correct.  Postoperative plan: The patient will be nonweightbearing to the right upper extremity. He will start physical therapy in approximately 1-2 weeks.  Azucena Cecil, MD [place] 11:14 AM

## 2014-11-16 NOTE — H&P (Signed)
PREOPERATIVE H&P  Chief Complaint: Right shoulder rotator cuf tear  HPI: Mark [name] is a 49 y.o. male who presents for surgical treatment of Right shoulder rotator cuf tear.  He denies any changes in medical history.  Past Medical History  Diagnosis Date  . 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 ETOH abuse   . Mental disorder   . Irregular heart beat   . Shortness of breath   . Headache(784.0)   . Hepatitis   . Alcohol withdrawal   . Neuropathy 06/07/2013  . 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 Seizure due to alcohol withdrawal 01/23/2014    Per patient report  . Anxiety Dx 2014  . GERD (gastroesophageal reflux disease) Dx 2003  . Hyperlipidemia Dx 2000  . Hypertension Dx 2011  . Withdrawal seizures 2014  . COPD (chronic obstructive pulmonary disease)    Past Surgical History  Procedure Laterality Date  . Back surgery    . Neck surgery    . Fundoplasty transthoracic  2003  . Nasal sinus surgery    . Left heart catheterization with coronary angiogram N/A 01/13/2014    Procedure: LEFT HEART CATHETERIZATION WITH CORONARY ANGIOGRAM;  Surgeon: Clent Demark, MD;  Location: [facility] CATH LAB;  Service: Cardiovascular;  Laterality: N/A;   Social History   Social History  . Marital Status: Divorced    Spouse Name: N/A  . Number of Children: 2  . Years of Education: N/A   Occupational History  . [name]    Social History Main Topics  . Smoking status: Current Every Day Smoker -- 0.50 packs/day for 30 years    Types: Cigarettes  . Smokeless tobacco: Never Used  . Alcohol Use: No  . Drug Use: No     Comment: [place]  . Sexual Activity: No   Other Topics Concern  . None   Social History Narrative   Family History  Problem Relation Age of Onset  . Breast cancer Paternal Grandmother   . Lung cancer      was a smoker  . Hypertension Mother    Allergies  Allergen Reactions  . Lisinopril Anaphylaxis and Swelling    Per Dr.  Terrence Dupont (12/20/13), patient can tolerate losartan.  . Cozaar [Losartan] Swelling   Prior to Admission medications   Medication Sig Start Date End Date Taking? Authorizing Provider  Acetaminophen (TYLENOL EXTRA STRENGTH PO) Take by mouth.   Yes Historical Provider, MD  acetaminophen-codeine (TYLENOL #3) 300-30 MG per tablet Take 1 tablet by mouth 2 (two) times daily as needed for moderate pain. 11/03/14  Yes Boykin Nearing, MD  aspirin 81 MG chewable tablet Chew 81 mg by mouth daily.   Yes Historical Provider, MD  gabapentin (NEURONTIN) 400 MG capsule Take 2 capsules (800 mg total) by mouth 3 (three) times daily. For substance withdrawal syndrome/paina management Patient taking differently: Take 800 mg by mouth. For substance withdrawal syndrome/paina management 07/28/14  Yes Boykin Nearing, MD  lansoprazole (PREVACID) 15 MG capsule Take 15 mg by mouth daily at 12 noon.   Yes Historical Provider, MD  metoprolol (LOPRESSOR) 50 MG tablet Take 1 tablet (50 mg total) by mouth 2 (two) times daily. 07/04/14  Yes Josalyn Funches, MD  albuterol (PROVENTIL HFA;VENTOLIN HFA) 108 (90 BASE) MCG/ACT inhaler Inhale 2 puffs into the lungs every 4 (four) hours as needed for wheezing or shortness of breath. 07/28/14   Boykin Nearing, MD  cyclobenzaprine (FLEXERIL)  10 MG tablet Take 1 tablet (10 mg total) by mouth 2 (two) times daily as needed for muscle spasms. 06/22/14   Gerda Diss, MD  fluticasone [facility]) 50 MCG/ACT nasal spray Place 2 sprays into both nostrils daily. 08/03/14   Josalyn Funches, MD     Positive ROS: All other systems have been reviewed and were otherwise negative with the exception of those mentioned in the HPI and as above.  Physical Exam: General: Alert, no acute distress Cardiovascular: No pedal edema Respiratory: No cyanosis, no use of accessory musculature GI: abdomen soft Skin: No lesions in the area of chief complaint Neurologic: Sensation intact distally Psychiatric: Patient  is competent for consent with normal mood and affect Lymphatic: no lymphedema  MUSCULOSKELETAL: exam stable  Assessment: Right shoulder rotator cuf tear  Plan: Plan for Procedure(s): RIGHT SHOULDER ARTHROSCOPY WITH SUBACROMIAL DECOMPRESSION AND POSSIBLE OPEN ROTATOR CUFF REPAIR AND BICEPS TENODESIS  The risks benefits and alternatives were discussed with the patient including but not limited to the risks of nonoperative treatment, versus surgical intervention including infection, bleeding, nerve injury,  blood clots, cardiopulmonary complications, morbidity, mortality, among others, and they were willing to proceed.   [name], MD   11/16/2014 8:10 AM

## 2014-11-16 NOTE — Anesthesia Postprocedure Evaluation (Signed)
Anesthesia Post Note  Patient: Mark [name]  Procedure(s) Performed: Procedure(s) (LRB): RIGHT SHOULDER ARTHROSCOPY WITH SUBACROMIAL DECOMPRESSION AND  OPEN ROTATOR CUFF REPAIR AND BICEPS TENODESIS (Right)  Anesthesia type: General  Patient location: PACU  Post pain: Pain level controlled  Post assessment: Post-op Vital signs reviewed  Last Vitals: BP 150/93 mmHg  Pulse 61  Temp(Src) 36.6 C (Oral)  Resp 18  Ht 6\' 4"  (1.93 m)  Wt 219 lb (99.338 kg)  BMI 26.67 kg/m2  SpO2 98%  Post vital signs: Reviewed  Level of consciousness: sedated  Complications: No apparent anesthesia complications

## 2014-11-16 NOTE — Transfer of Care (Signed)
Immediate Anesthesia Transfer of Care Note  Patient: Mark [name]  Procedure(s) Performed: Procedure(s): RIGHT SHOULDER ARTHROSCOPY WITH SUBACROMIAL DECOMPRESSION AND  OPEN ROTATOR CUFF REPAIR AND BICEPS TENODESIS (Right)  Patient Location: PACU  Anesthesia Type:GA combined with regional for post-op pain  Level of Consciousness: awake, alert , oriented and patient cooperative  Airway & Oxygen Therapy: Patient Spontanous Breathing and Patient connected to face mask oxygen  Post-op Assessment: Report given to RN and Post -op Vital signs reviewed and stable  Post vital signs: Reviewed and stable  Last Vitals:  Filed Vitals:   11/16/14 0821  BP:   Pulse: 65  Temp:   Resp: 17    Complications: No apparent anesthesia complications

## 2014-11-16 NOTE — Progress Notes (Signed)
Assisted Dr. Germeroth with right, ultrasound guided, interscalene  block. Side rails up, monitors on throughout procedure. See vital signs in flow sheet. Tolerated Procedure well.  

## 2014-11-16 NOTE — Anesthesia Procedure Notes (Addendum)
Anesthesia Regional Block:  Interscalene brachial plexus block  Pre-Anesthetic Checklist: ,, timeout performed, Correct Patient, Correct Site, Correct Laterality, Correct Procedure, Correct Position, site marked, Risks and benefits discussed, Surgical consent,  Pre-op evaluation,  Post-op pain management  Laterality: Right  Prep: chloraprep       Needles:  Injection technique: Single-shot  Needle Type: Stimulator Needle - 40     Needle Length: 4cm 4 cm Needle Gauge: 22 and 22 G    Additional Needles:  Procedures: ultrasound guided (picture in chart) Interscalene brachial plexus block Narrative:  Injection made incrementally with aspirations every 5 mL. Anesthesiologist: Nolon Nations  Additional Notes: BP cuff, EKG monitors applied. Sedation begun. Nerve location verified with U/S. Anesthetic injected incrementally, slowly , and after neg aspirations under direct u/s guidance. Good perineural spread. Tolerated well.   Procedure Name: Intubation Date/Time: 11/16/2014 8:31 AM Performed by: Christabelle Hanzlik D Pre-anesthesia Checklist: Patient identified, Emergency Drugs available, Suction available and Patient being monitored Patient Re-evaluated:Patient Re-evaluated prior to inductionOxygen Delivery Method: Circle System Utilized Preoxygenation: Pre-oxygenation with 100% oxygen Intubation Type: IV induction Ventilation: Mask ventilation without difficulty Laryngoscope Size: Glidescope Grade View: Grade I Tube type: Oral Tube size: 7.0 mm Number of attempts: 1 Airway Equipment and Method: Stylet and Oral airway Placement Confirmation: ETT inserted through vocal cords under direct vision,  positive ETCO2 and breath sounds checked- equal and bilateral Secured at: 21 cm Tube secured with: Tape Dental Injury: Teeth and Oropharynx as per pre-operative assessment  Comments: Patient stated change in voice since previous intubation, discussion with Dr Chelsea Aus decision to  utilize glide scope

## 2014-11-16 NOTE — Anesthesia Preprocedure Evaluation (Addendum)
Anesthesia Evaluation  Patient identified by MRN, date of birth, ID band Patient awake    Reviewed: Allergy & Precautions, NPO status , Patient's Chart, lab work & pertinent test results  Airway Mallampati: II  TM Distance: >3 FB Neck ROM: Full    Dental no notable dental hx.    Pulmonary neg pulmonary ROS, shortness of breath, COPD, Current Smoker,    Pulmonary exam normal breath sounds clear to auscultation       Cardiovascular hypertension, Pt. on home beta blockers Normal cardiovascular exam Rhythm:Regular Rate:Normal     Neuro/Psych  Headaches, Seizures -,  PSYCHIATRIC DISORDERS Anxiety Depression negative psych ROS   GI/Hepatic negative GI ROS, GERD  Medicated,(+)     substance abuse  alcohol use, Hepatitis -  Endo/Other  negative endocrine ROS  Renal/GU negative Renal ROS     Musculoskeletal negative musculoskeletal ROS (+)   Abdominal   Peds  Hematology negative hematology ROS (+)   Anesthesia Other Findings   Reproductive/Obstetrics                            Anesthesia Physical Anesthesia Plan  ASA: III  Anesthesia Plan: General and Regional   Post-op Pain Management: GA combined w/ Regional for post-op pain   Induction: Intravenous  Airway Management Planned: Oral ETT  Additional Equipment:   Intra-op Plan:   Post-operative Plan: Extubation in OR  Informed Consent: I have reviewed the patients History and Physical, chart, labs and discussed the procedure including the risks, benefits and alternatives for the proposed anesthesia with the patient or authorized representative who has indicated his/her understanding and acceptance.   Dental advisory given  Plan Discussed with: CRNA  Anesthesia Plan Comments:         Anesthesia Quick Evaluation

## 2014-11-17 ENCOUNTER — Encounter (HOSPITAL_BASED_OUTPATIENT_CLINIC_OR_DEPARTMENT_OTHER): Payer: Self-pay | Admitting: Orthopaedic Surgery

## 2014-11-30 ENCOUNTER — Ambulatory Visit (INDEPENDENT_AMBULATORY_CARE_PROVIDER_SITE_OTHER): Payer: No Typology Code available for payment source | Admitting: Gastroenterology

## 2014-11-30 ENCOUNTER — Other Ambulatory Visit: Payer: Self-pay

## 2014-11-30 ENCOUNTER — Encounter: Payer: Self-pay | Admitting: Gastroenterology

## 2014-11-30 VITALS — BP 108/68 | HR 68 | Ht 75.2 in | Wt 223.5 lb

## 2014-11-30 DIAGNOSIS — R1314 Dysphagia, pharyngoesophageal phase: Secondary | ICD-10-CM

## 2014-11-30 DIAGNOSIS — K219 Gastro-esophageal reflux disease without esophagitis: Secondary | ICD-10-CM

## 2014-11-30 DIAGNOSIS — R140 Abdominal distension (gaseous): Secondary | ICD-10-CM

## 2014-11-30 NOTE — Progress Notes (Signed)
HPI :  49 y/o male referred for upper GI symptoms. Patient endorses ongoing reflux symptoms. He endorses "acid burning" in the chest, throat, and up into his ears. He denies regurgitation of food or gastric contents. He does endorse some dysphagia to eating solids but states it has been like that since having a Nissen fundoplication. He had a Nissen in 2003 for reflux symptoms that were refractory to PPI. He reports the Nissen helped with heartburn symptoms but caused new problems with dysphagia and gas/bloat syndrome. He is taking lansoprazole 15mg  BID for heartburn which provides some benefit but does not take away his heartburn entirely and has breakthrough. He can't burp or vomit due to the prior surgery. He reports prior to having the Nissen performed he was not responsive to PPIs. He otherwise has some upper abdominal discomfort at times as well with his bloating. He has not had a prior upper endoscopy since his surgery.  He does not take anything else for his GERD.  No FH of CRC or esophageal, no stomach CA. He recently has a shoulder surgery about 2 weeks ago and has ongoing shoulder pain. He does not drink any alcohol at this time, he smokes 1/2 PPD.     Past Medical History  Diagnosis Date  . 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 ETOH abuse   . Mental disorder   . Irregular heart beat   . Shortness of breath   . Headache(784.0)   . Hepatitis   . Alcohol withdrawal   . Neuropathy 06/07/2013  . 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 Seizure due to alcohol withdrawal 01/23/2014    Per patient report  . Anxiety Dx 2014  . GERD (gastroesophageal reflux disease) Dx 2003  . Hyperlipidemia Dx 2000  . Hypertension Dx 2011  . Withdrawal seizures 2014  . COPD (chronic obstructive pulmonary disease)      Past Surgical History  Procedure Laterality Date  . Back surgery    . Neck surgery    . Fundoplasty transthoracic  2003  . Nasal sinus surgery      . Left heart catheterization with coronary angiogram N/A 01/13/2014    Procedure: LEFT HEART CATHETERIZATION WITH CORONARY ANGIOGRAM;  Surgeon: Clent Demark, MD;  Location: [facility] CATH LAB;  Service: Cardiovascular;  Laterality: N/A;  . Subacromial decompression Right 11/16/2014    Procedure: RIGHT SHOULDER ARTHROSCOPY WITH SUBACROMIAL DECOMPRESSION ;  Surgeon: Leandrew Koyanagi, MD;  Location: [facility];  Service: Orthopedics;  Laterality: Right;   Family History  Problem Relation Age of Onset  . Breast cancer Paternal Grandmother   . Lung cancer      was a smoker  . Hypertension Mother    Social History  Substance Use Topics  . Smoking status: Current Every Day Smoker -- 0.50 packs/day for 30 years    Types: Cigarettes  . Smokeless tobacco: Never Used  . Alcohol Use: No   Current Outpatient Prescriptions  Medication Sig Dispense Refill  . Acetaminophen (TYLENOL EXTRA STRENGTH PO) Take by mouth.    Marland Kitchen acetaminophen-codeine (TYLENOL #3) 300-30 MG per tablet Take 1 tablet by mouth 2 (two) times daily as needed for moderate pain. 60 tablet 1  . albuterol (PROVENTIL HFA;VENTOLIN HFA) 108 (90 BASE) MCG/ACT inhaler Inhale 2 puffs into the lungs every 4 (four) hours as needed for wheezing or shortness of breath. 6.7 g 2  . cyclobenzaprine (FLEXERIL) 10 MG tablet  Take 1 tablet (10 mg total) by mouth 2 (two) times daily as needed for muscle spasms. 60 tablet 0  . gabapentin (NEURONTIN) 400 MG capsule Take 2 capsules (800 mg total) by mouth 3 (three) times daily. For substance withdrawal syndrome/paina management (Patient taking differently: Take 800 mg by mouth. For substance withdrawal syndrome/paina management) 180 capsule 5  . lansoprazole (PREVACID) 15 MG capsule Take 15 mg by mouth daily at 12 noon.     . metoprolol (LOPRESSOR) 50 MG tablet Take 1 tablet (50 mg total) by mouth 2 (two) times daily. 60 tablet 11  . oxyCODONE-acetaminophen (PERCOCET) 5-325 MG per tablet Take 1-2 tablets  by mouth every 4 (four) hours as needed for severe pain. 90 tablet 0   No current facility-administered medications for this visit.   Allergies  Allergen Reactions  . Lisinopril Anaphylaxis and Swelling    Per Dr. Terrence Dupont (12/20/13), patient can tolerate losartan.  . Cozaar [Losartan] Swelling     Review of Systems: All systems reviewed and negative except where noted in HPI.    CBC and LFTs normal in July 2016  RUQ [place] normal recently  Physical Exam: Ht 6' 3.2" (1.91 m)  Wt 223 lb 8 oz (101.379 kg)  BMI 27.79 kg/m2 Constitutional: Pleasant,well-developed, male in no acute distress. R arm in sling HEENT: Normocephalic and atraumatic. Conjunctivae are normal. No scleral icterus. Neck supple.  Cardiovascular: Normal rate, regular rhythm.  Pulmonary/chest: Effort normal and breath sounds normal. No wheezing, rales or rhonchi. Abdominal: Soft, nondistended, mild epigastric TTP without rebound or guarding, bowel sounds active throughout. There are no masses palpable. No hepatomegaly. Extremities: no edema Lymphadenopathy: No cervical adenopathy noted. Neurological: Alert and oriented to person place and time. Skin: Skin is warm and dry. No rashes noted. Psychiatric: Normal mood and affect. Behavior is normal.   ASSESSMENT AND PLAN: 49 y/o male with longstanding history of GERD. He was PPI nonresponsive historically and underwent Nissen in 2003. The surgery helped his heartburn but led to dysphagia and gas/bloat syndrome. Now more recently his heartburn has recurred and bothering him significantly, indicating that his Nissen may have likely loosened, although still has some symptoms of dysphagia and bloating after he eats. He is on prevacid 15mg  BID which has helped but not significantly relieved his symptoms. To further evaluate his anatomy and assess integrity of the Nissen, I offered him upper endoscopy. In the interim continue BID PPI and he can take some Gaviscon PRN to help with  his breakthrough heartburn symptoms.  Further recommendations pending the results of EGD and his response to therapy. Recommend sleeping with HOB elevated and avoid eating anything within 3 hours of bedtime to help with nighttime symptoms.   The indications, risks, and benefits of EGD were explained to the patient in detail. Risks include but are not limited to bleeding, perforation, adverse reaction to medications, and cardiopulmonary compromise. Sequelae include but are not limited to the possibility of surgery, hospitalization, and mortality. The patient verbalized understanding and wished to proceed. All questions answered, referred to scheduler. Further recommendations pending results of the exam.   Amado Cellar, MD [facility] Pager [phone]

## 2014-11-30 NOTE — Patient Instructions (Signed)

## 2014-12-12 ENCOUNTER — Ambulatory Visit (AMBULATORY_SURGERY_CENTER): Payer: Self-pay | Admitting: Gastroenterology

## 2014-12-12 ENCOUNTER — Encounter: Payer: Self-pay | Admitting: Gastroenterology

## 2014-12-12 VITALS — BP 141/80 | HR 72 | Temp 97.5°F | Resp 19 | Ht 75.0 in | Wt 223.0 lb

## 2014-12-12 DIAGNOSIS — K2970 Gastritis, unspecified, without bleeding: Secondary | ICD-10-CM

## 2014-12-12 DIAGNOSIS — K2990 Gastroduodenitis, unspecified, without bleeding: Secondary | ICD-10-CM

## 2014-12-12 DIAGNOSIS — K219 Gastro-esophageal reflux disease without esophagitis: Secondary | ICD-10-CM

## 2014-12-12 DIAGNOSIS — R1310 Dysphagia, unspecified: Secondary | ICD-10-CM

## 2014-12-12 MED ORDER — SODIUM CHLORIDE 0.9 % IV SOLN: 500.0000 mL | INTRAVENOUS | Status: DC

## 2014-12-12 NOTE — Patient Instructions (Signed)
YOU HAD AN ENDOSCOPIC PROCEDURE TODAY AT [facility]:   Refer to the procedure report that was given to you for any specific questions about what was found during the examination.  If the procedure report does not answer your questions, please call your gastroenterologist to clarify.  If you requested that your care partner not be given the details of your procedure findings, then the procedure report has been included in a sealed envelope for you to review at your convenience later.  YOU SHOULD EXPECT: Some feelings of bloating in the abdomen. Passage of more gas than usual.  Walking can help get rid of the air that was put into your GI tract during the procedure and reduce the bloating.   Please Note:  You might notice some irritation and congestion in your nose or some drainage.  This is from the oxygen used during your procedure.  There is no need for concern and it should clear up in a day or so.  SYMPTOMS TO REPORT IMMEDIATELY:    Following upper endoscopy (EGD)  Vomiting of blood or coffee ground material  New chest pain or pain under the shoulder blades  Painful or persistently difficult swallowing  New shortness of breath  Fever of 100F or higher  Black, tarry-looking stools  For urgent or emergent issues, a gastroenterologist can be reached at any hour by calling [phone].   DIET: Your first meal following the procedure should be a small meal and then it is ok to progress to your normal diet. Heavy or fried foods are harder to digest and may make you feel nauseous or bloated.  Likewise, meals heavy in dairy and vegetables can increase bloating.  Drink plenty of fluids but you should avoid alcoholic beverages for 24 hours.  ACTIVITY:  You should plan to take it easy for the rest of today and you should NOT DRIVE or use heavy machinery until tomorrow (because of the sedation medicines used during the test).    FOLLOW UP: Our staff will call the number listed  on your records the next business day following your procedure to check on you and address any questions or concerns that you may have regarding the information given to you following your procedure. If we do not reach you, we will leave a message.  However, if you are feeling well and you are not experiencing any problems, there is no need to return our call.  We will assume that you have returned to your regular daily activities without incident.  If any biopsies were taken you will be contacted by phone or by letter within the next 1-3 weeks.  Please call us at [phone] if you have not heard about the biopsies in 3 weeks.    SIGNATURES/CONFIDENTIALITY: You and/or your care partner have signed paperwork which will be entered into your electronic medical record.  These signatures attest to the fact that that the information above on your After Visit Summary has been reviewed and is understood.  Full responsibility of the confidentiality of this discharge information lies with you and/or your care-partner.  We will call you if you have an H-pylori infection, because you will need antibiotics.

## 2014-12-12 NOTE — Op Note (Signed)
[facility]  [address] [name], [address]   ENDOSCOPY PROCEDURE REPORT  PATIENT: Mark [name], Mark [name]  MR#: [id number] BIRTHDATE: 07/26/1965 , 49  yrs. old GENDER: male ENDOSCOPIST: Yetta Flock, MD REFERRED BY: PROCEDURE DATE:  12/12/2014 PROCEDURE:  EGD w/ biopsy ASA CLASS:     Class III INDICATIONS:  heartburn and dysphagia. MEDICATIONS: Propofol 200 mg IV TOPICAL ANESTHETIC:  DESCRIPTION OF PROCEDURE: After the risks benefits and alternatives of the procedure were thoroughly explained, informed consent was obtained.  The LB [id number] endoscope was introduced through the mouth and advanced to the second portion of the duodenum , Without limitations.  The instrument was slowly withdrawn as the mucosa was fully examined.   FINDINGS: The esophagus was normal.  DH, GEJ, and SCJ located 42cm from the insicors.  The Nissen fundoplication was observed on retroflexion and appeared intact without laxity.  The stomach otherwise was remarkable for erythema in the gastric body without ulceration or erosions, biopsies were obtained.  The remainder of the examined stomach was normal.  Normal duodenal bulb and 2nd portion of the duodenum otherwise.  Retroflexed views revealed as previously described.     The scope was then withdrawn from the patient and the procedure completed.  COMPLICATIONS: There were no immediate complications.  ENDOSCOPIC IMPRESSION: Normal esophagus without esophagitis Intact Nissen fundoplication, which is the likely cause of the patient's dysphagia. Dilation not performed given symptoms of reflux Mild gastritis - biopsies obtained to rule out H pylori Normal remainder of examined stomach and duodenum.  RECOMMENDATIONS: Resume diet Resume medications [place] pathology results.  eSigned:  Yetta Flock, MD 12/12/2014 1:32 PM    CC:

## 2014-12-12 NOTE — Progress Notes (Signed)
To recovery, report to [place], RN, VSS 

## 2014-12-12 NOTE — Progress Notes (Signed)
Called to room to assist during endoscopic procedure.  Patient ID and intended procedure confirmed with present staff. Received instructions for my participation in the procedure from the performing physician.  

## 2014-12-13 ENCOUNTER — Telehealth: Payer: Self-pay | Admitting: *Deleted

## 2014-12-13 NOTE — Telephone Encounter (Signed)
  Follow up Call-  Call back number 12/12/2014  Post procedure Call Back phone  # [phone]  Permission to leave phone message Yes     Patient questions:  Do you have a fever, pain , or abdominal swelling? No. Pain Score  0 *  Have you tolerated food without any problems? Yes.    Have you been able to return to your normal activities? Yes.    Do you have any questions about your discharge instructions: Diet   No. Medications  No. Follow up visit  No.  Do you have questions or concerns about your Care? No.  Actions: * If pain score is 4 or above: No action needed, pain <4.

## 2014-12-16 ENCOUNTER — Encounter: Payer: Self-pay | Admitting: *Deleted

## 2014-12-16 ENCOUNTER — Other Ambulatory Visit: Payer: Self-pay | Admitting: *Deleted

## 2014-12-16 DIAGNOSIS — R1310 Dysphagia, unspecified: Secondary | ICD-10-CM

## 2014-12-19 ENCOUNTER — Ambulatory Visit: Payer: No Typology Code available for payment source | Attending: Family Medicine | Admitting: Family Medicine

## 2014-12-19 ENCOUNTER — Encounter: Payer: Self-pay | Admitting: Family Medicine

## 2014-12-19 VITALS — BP 145/89 | HR 77 | Temp 98.8°F | Resp 16 | Ht 76.0 in | Wt 225.0 lb

## 2014-12-19 DIAGNOSIS — F419 Anxiety disorder, unspecified: Secondary | ICD-10-CM

## 2014-12-19 DIAGNOSIS — R300 Dysuria: Secondary | ICD-10-CM | POA: Insufficient documentation

## 2014-12-19 DIAGNOSIS — N429 Disorder of prostate, unspecified: Secondary | ICD-10-CM | POA: Insufficient documentation

## 2014-12-19 DIAGNOSIS — Z79899 Other long term (current) drug therapy: Secondary | ICD-10-CM | POA: Insufficient documentation

## 2014-12-19 DIAGNOSIS — F17210 Nicotine dependence, cigarettes, uncomplicated: Secondary | ICD-10-CM | POA: Insufficient documentation

## 2014-12-19 DIAGNOSIS — N4281 Prostatodynia syndrome: Secondary | ICD-10-CM

## 2014-12-19 DIAGNOSIS — N529 Male erectile dysfunction, unspecified: Secondary | ICD-10-CM | POA: Insufficient documentation

## 2014-12-19 DIAGNOSIS — K921 Melena: Secondary | ICD-10-CM

## 2014-12-19 MED ORDER — TAMSULOSIN HCL 0.4 MG PO CAPS
0.8000 mg | ORAL_CAPSULE | Freq: Every day | ORAL | Status: DC
Start: 2014-12-19 — End: 2015-02-13

## 2014-12-19 NOTE — Progress Notes (Signed)
C/C prostate problems  Pain with urination, intercourse and BM  Pain scale with urination # 5

## 2014-12-19 NOTE — Patient Instructions (Addendum)
Mark [name] was seen today for dysuria.  Diagnoses and all orders for this visit:  Tender prostate -     tamsulosin (FLOMAX) 0.4 MG CAPS capsule; Take 2 capsules (0.8 mg total) by mouth daily after supper. -     Ambulatory referral to Urology  Pilar Plate blood in stool -     Ambulatory referral to Gastroenterology   For anxiety and bipolar:  Pursue psychiatric services at [facility] [place] and [place].   F/u in 8 weeks for prostate   Dr. Adrian Blackwater

## 2014-12-19 NOTE — Progress Notes (Signed)
Patient ID: Mark [name], male   DOB: [birth year], 49 y.o.   MRN: [id number]   Subjective:  Patient ID: Mark [name], male    DOB: [birth year]  Age: 49 y.o. MRN: [id number]  CC: Dysuria   HPI Mark [name] presents for   1. Dysuria: x 15 months. We discussed this in 09/2014. Normal PSA. Tender prostate on exam. He tried flomax 0.4 mg daily for a few weeks. He stopped due to no improvement. He reports frequency, dysuria, pain with bowel movements.  He has chronic constipation in setting of chronic opioids. He endorses gross blood mixed in stool at time.   2. Erectile dysfunction: difficulty obtaining and maintaining erection.   3. Mood disorder: untreated depression and anxiety. He reports being diagnosed with bipolar depression an anxiety. He went to [facility] and was prescribed medication but did not take it due to concerns about side effects. He reports ongoing depressed mood and anxiety. He denies ETOH.   Social History  Substance Use Topics  . Smoking status: Current Every Day Smoker -- 0.50 packs/day for 30 years    Types: Cigarettes  . Smokeless tobacco: Never Used     Comment: 11-30-2014 info given  . Alcohol Use: No   Outpatient Prescriptions Prior to Visit  Medication Sig Dispense Refill  . Acetaminophen (TYLENOL EXTRA STRENGTH PO) Take by mouth.    Marland Kitchen albuterol (PROVENTIL HFA;VENTOLIN HFA) 108 (90 BASE) MCG/ACT inhaler Inhale 2 puffs into the lungs every 4 (four) hours as needed for wheezing or shortness of breath. 6.7 g 2  . cyclobenzaprine (FLEXERIL) 10 MG tablet Take 1 tablet (10 mg total) by mouth 2 (two) times daily as needed for muscle spasms. 60 tablet 0  . gabapentin (NEURONTIN) 400 MG capsule Take 2 capsules (800 mg total) by mouth 3 (three) times daily. For substance withdrawal syndrome/paina management (Patient taking differently: Take 800 mg by mouth. For substance withdrawal syndrome/paina management) 180 capsule 5  . lansoprazole (PREVACID) 15 MG capsule Take 15  mg by mouth daily at 12 noon.     . metoprolol (LOPRESSOR) 50 MG tablet Take 1 tablet (50 mg total) by mouth 2 (two) times daily. 60 tablet 11  . oxyCODONE-acetaminophen (PERCOCET) 5-325 MG per tablet Take 1-2 tablets by mouth every 4 (four) hours as needed for severe pain. 90 tablet 0  . acetaminophen-codeine (TYLENOL #3) 300-30 MG per tablet Take 1 tablet by mouth 2 (two) times daily as needed for moderate pain. (Patient not taking: Reported on 12/19/2014) 60 tablet 1   No facility-administered medications prior to visit.    ROS Review of Systems  Constitutional: Negative for fever, chills, fatigue and unexpected weight change.  Eyes: Negative for visual disturbance.  Respiratory: Negative for cough and shortness of breath.   Cardiovascular: Negative for chest pain, palpitations and leg swelling.  Gastrointestinal: Positive for constipation and blood in stool. Negative for nausea, vomiting, abdominal pain and diarrhea.  Endocrine: Negative for polydipsia, polyphagia and polyuria.  Genitourinary: Positive for dysuria, frequency and penile pain.  Musculoskeletal: Positive for myalgias, arthralgias and gait problem. Negative for back pain and neck pain.  Skin: Negative for rash.  Allergic/Immunologic: Negative for immunocompromised state.  Hematological: Negative for adenopathy. Does not bruise/bleed easily.  Psychiatric/Behavioral: Negative for suicidal ideas, sleep disturbance and dysphoric mood. The patient is not nervous/anxious.     Objective:  BP 145/89 mmHg  Pulse 77  Temp(Src) 98.8 F (37.1 C) (Oral)  Resp 16  Ht 6\' 4"  (1.93 m)  Wt 225 lb (102.059 kg)  BMI 27.40 kg/m2  SpO2 98%  BP/Weight 12/19/2014 12/12/2014 0/27/2536  Systolic BP 644 034 742  Diastolic BP 89 80 68  Wt. (Lbs) 225 223 223.5  BMI 27.4 27.87 27.79  Some encounter information is confidential and restricted. Go to Review Flowsheets activity to see all data.   Physical Exam  Constitutional: He appears  well-developed and well-nourished. No distress.  HENT:  Head: Normocephalic and atraumatic.  Neck: Normal range of motion. Neck supple.  Cardiovascular: Normal rate, regular rhythm, normal heart sounds and intact distal pulses.   Pulmonary/Chest: Effort normal and breath sounds normal.  Musculoskeletal: He exhibits no edema.  Neurological: He is alert.  Skin: Skin is warm and dry. No rash noted. No erythema.  Psychiatric: He has a normal mood and affect.   PSA 1.16  Assessment & Plan:   Problem List Items Addressed This Visit    Chronic anxiety (Chronic)   Tender prostate - Primary   Relevant Medications   tamsulosin (FLOMAX) 0.4 MG CAPS capsule   Other Relevant Orders   Ambulatory referral to Urology    Other Visit Diagnoses    Pilar Plate blood in stool        Relevant Orders    Ambulatory referral to Gastroenterology       No orders of the defined types were placed in this encounter.    Follow-up: No Follow-up on file.   Boykin Nearing MD

## 2014-12-28 ENCOUNTER — Ambulatory Visit: Payer: No Typology Code available for payment source | Attending: Family Medicine

## 2015-01-11 ENCOUNTER — Other Ambulatory Visit (HOSPITAL_COMMUNITY): Payer: Self-pay | Admitting: Orthopaedic Surgery

## 2015-01-11 DIAGNOSIS — M25511 Pain in right shoulder: Secondary | ICD-10-CM

## 2015-01-16 ENCOUNTER — Encounter (HOSPITAL_COMMUNITY)
Admission: RE | Disposition: A | Discharge status: Home / Self Care | Payer: Self-pay | Source: Ambulatory Visit | Attending: Gastroenterology

## 2015-01-16 ENCOUNTER — Ambulatory Visit (HOSPITAL_COMMUNITY)
Admission: RE | Admit: 2015-01-16 | Discharge: 2015-01-16 | Disposition: A | Discharge status: Home / Self Care | Payer: No Typology Code available for payment source | Source: Ambulatory Visit | Attending: Gastroenterology | Admitting: Gastroenterology

## 2015-01-16 DIAGNOSIS — R1310 Dysphagia, unspecified: Secondary | ICD-10-CM | POA: Insufficient documentation

## 2015-01-16 DIAGNOSIS — Z5329 Procedure and treatment not carried out because of patient's decision for other reasons: Secondary | ICD-10-CM | POA: Insufficient documentation

## 2015-01-16 HISTORY — PX: 24 HOUR PH STUDY: SHX5419

## 2015-01-16 SURGERY — MONITORING, ESOPHAGEAL PH, 24 HOUR

## 2015-01-16 MED ORDER — LIDOCAINE VISCOUS 2 % MT SOLN
OROMUCOSAL | Status: AC
  Filled 2015-01-16: qty 15

## 2015-01-16 NOTE — Progress Notes (Signed)
Pt to [facility] for Manometry with PH impedance study. Attempted to place manometry probe by 2 RN's . Unable to pass due to pt c/o gagging and unable to tolerate procedure. Pt then refused to go any further and stated he would not be able to tolerate the PH probe in for 24 hours and was just not able to tolerate the manometry probe.Will inform Dr. Havery Moros.

## 2015-01-17 ENCOUNTER — Encounter (HOSPITAL_COMMUNITY): Payer: Self-pay | Admitting: Gastroenterology

## 2015-01-23 ENCOUNTER — Ambulatory Visit (HOSPITAL_COMMUNITY)
Admission: RE | Admit: 2015-01-23 | Discharge: 2015-01-23 | Disposition: A | Discharge status: Home / Self Care | Payer: Self-pay | Source: Ambulatory Visit | Attending: Orthopaedic Surgery | Admitting: Orthopaedic Surgery

## 2015-01-23 DIAGNOSIS — Z9889 Other specified postprocedural states: Secondary | ICD-10-CM | POA: Insufficient documentation

## 2015-01-23 DIAGNOSIS — M25511 Pain in right shoulder: Secondary | ICD-10-CM | POA: Insufficient documentation

## 2015-02-05 ENCOUNTER — Encounter (HOSPITAL_COMMUNITY): Payer: Self-pay | Admitting: Emergency Medicine

## 2015-02-05 ENCOUNTER — Emergency Department (HOSPITAL_COMMUNITY)
Admission: EM | Admit: 2015-02-05 | Discharge: 2015-02-07 | Disposition: A | Discharge status: Home / Self Care | Payer: No Typology Code available for payment source | Attending: Emergency Medicine | Admitting: Emergency Medicine

## 2015-02-05 DIAGNOSIS — F32A Depression, unspecified: Secondary | ICD-10-CM

## 2015-02-05 DIAGNOSIS — F319 Bipolar disorder, unspecified: Secondary | ICD-10-CM | POA: Diagnosis present

## 2015-02-05 DIAGNOSIS — I10 Essential (primary) hypertension: Secondary | ICD-10-CM | POA: Insufficient documentation

## 2015-02-05 DIAGNOSIS — J441 Chronic obstructive pulmonary disease with (acute) exacerbation: Secondary | ICD-10-CM | POA: Insufficient documentation

## 2015-02-05 DIAGNOSIS — F329 Major depressive disorder, single episode, unspecified: Secondary | ICD-10-CM

## 2015-02-05 DIAGNOSIS — F419 Anxiety disorder, unspecified: Secondary | ICD-10-CM | POA: Insufficient documentation

## 2015-02-05 DIAGNOSIS — M25511 Pain in right shoulder: Secondary | ICD-10-CM | POA: Insufficient documentation

## 2015-02-05 DIAGNOSIS — E785 Hyperlipidemia, unspecified: Secondary | ICD-10-CM | POA: Insufficient documentation

## 2015-02-05 DIAGNOSIS — Z8619 Personal history of other infectious and parasitic diseases: Secondary | ICD-10-CM | POA: Insufficient documentation

## 2015-02-05 DIAGNOSIS — K219 Gastro-esophageal reflux disease without esophagitis: Secondary | ICD-10-CM | POA: Insufficient documentation

## 2015-02-05 DIAGNOSIS — F17210 Nicotine dependence, cigarettes, uncomplicated: Secondary | ICD-10-CM | POA: Insufficient documentation

## 2015-02-05 DIAGNOSIS — F10920 Alcohol use, unspecified with intoxication, uncomplicated: Secondary | ICD-10-CM

## 2015-02-05 DIAGNOSIS — Z79899 Other long term (current) drug therapy: Secondary | ICD-10-CM | POA: Insufficient documentation

## 2015-02-05 DIAGNOSIS — F10120 Alcohol abuse with intoxication, uncomplicated: Secondary | ICD-10-CM | POA: Insufficient documentation

## 2015-02-05 DIAGNOSIS — G8929 Other chronic pain: Secondary | ICD-10-CM | POA: Diagnosis present

## 2015-02-05 LAB — COMPREHENSIVE METABOLIC PANEL
ALBUMIN: 4.2 g/dL (ref 3.5–5.0)
ALK PHOS: 118 U/L (ref 38–126)
ALT: 21 U/L (ref 17–63)
ANION GAP: 15 (ref 5–15)
AST: 31 U/L (ref 15–41)
BILIRUBIN TOTAL: 0.3 mg/dL (ref 0.3–1.2)
BUN: 8 mg/dL (ref 6–20)
CALCIUM: 9.5 mg/dL (ref 8.9–10.3)
CO2: 17 mmol/L — ABNORMAL LOW (ref 22–32)
Chloride: 104 mmol/L (ref 101–111)
Creatinine, Ser: 0.82 mg/dL (ref 0.61–1.24)
Glucose, Bld: 140 mg/dL — ABNORMAL HIGH (ref 65–99)
POTASSIUM: 2.5 mmol/L — AB (ref 3.5–5.1)
Sodium: 136 mmol/L (ref 135–145)
TOTAL PROTEIN: 7 g/dL (ref 6.5–8.1)

## 2015-02-05 LAB — ACETAMINOPHEN LEVEL

## 2015-02-05 LAB — I-STAT VENOUS BLOOD GAS, ED
BICARBONATE: 24.2 meq/L — AB (ref 20.0–24.0)
O2 Saturation: 83 %
PH VEN: 7.411 — AB (ref 7.250–7.300)
TCO2: 25 mmol/L (ref 0–100)
pCO2, Ven: 38.1 mmHg — ABNORMAL LOW (ref 45.0–50.0)
pO2, Ven: 47 mmHg — ABNORMAL HIGH (ref 30.0–45.0)

## 2015-02-05 LAB — I-STAT CG4 LACTIC ACID, ED: Lactic Acid, Venous: 2.4 mmol/L (ref 0.5–2.0)

## 2015-02-05 LAB — CBC
HEMATOCRIT: 46.1 % (ref 39.0–52.0)
Hemoglobin: 15.7 g/dL (ref 13.0–17.0)
MCH: 27.4 pg (ref 26.0–34.0)
MCHC: 34.1 g/dL (ref 30.0–36.0)
MCV: 80.5 fL (ref 78.0–100.0)
PLATELETS: 361 10*3/uL (ref 150–400)
RBC: 5.73 MIL/uL (ref 4.22–5.81)
RDW: 13.6 % (ref 11.5–15.5)
WBC: 10.6 10*3/uL — AB (ref 4.0–10.5)

## 2015-02-05 LAB — RAPID URINE DRUG SCREEN, HOSP PERFORMED
Amphetamines: NOT DETECTED
BARBITURATES: NOT DETECTED
Benzodiazepines: NOT DETECTED
COCAINE: NOT DETECTED
OPIATES: NOT DETECTED
Tetrahydrocannabinol: NOT DETECTED

## 2015-02-05 LAB — MAGNESIUM: Magnesium: 2.3 mg/dL (ref 1.7–2.4)

## 2015-02-05 LAB — SALICYLATE LEVEL

## 2015-02-05 LAB — ETHANOL: ALCOHOL ETHYL (B): 193 mg/dL — AB (ref <5)

## 2015-02-05 MED ORDER — THIAMINE HCL 100 MG/ML IJ SOLN
100.0000 mg | Freq: Once | INTRAMUSCULAR | Status: AC
  Administered 2015-02-05: 100 mg via INTRAVENOUS
  Filled 2015-02-05: qty 2

## 2015-02-05 MED ORDER — HYDROMORPHONE HCL 1 MG/ML IJ SOLN
1.0000 mg | Freq: Once | INTRAMUSCULAR | Status: AC
  Administered 2015-02-05: 1 mg via INTRAVENOUS
  Filled 2015-02-05: qty 1

## 2015-02-05 MED ORDER — POTASSIUM CHLORIDE CRYS ER 20 MEQ PO TBCR
40.0000 meq | EXTENDED_RELEASE_TABLET | Freq: Once | ORAL | Status: AC
  Administered 2015-02-05: 40 meq via ORAL
  Filled 2015-02-05: qty 2

## 2015-02-05 MED ORDER — HALOPERIDOL LACTATE 5 MG/ML IJ SOLN
2.0000 mg | Freq: Once | INTRAMUSCULAR | Status: AC
  Administered 2015-02-05: 2 mg via INTRAVENOUS
  Filled 2015-02-05: qty 1

## 2015-02-05 MED ORDER — LORAZEPAM 2 MG/ML IJ SOLN
0.0000 mg | Freq: Four times a day (QID) | INTRAMUSCULAR | Status: DC
  Administered 2015-02-05: 1 mg via INTRAVENOUS
  Administered 2015-02-06 (×2): 2 mg via INTRAVENOUS
  Administered 2015-02-06: 1 mg via INTRAVENOUS
  Administered 2015-02-06: 2 mg via INTRAVENOUS
  Administered 2015-02-07: 1 mg via INTRAVENOUS
  Filled 2015-02-05 (×6): qty 1

## 2015-02-05 MED ORDER — SODIUM CHLORIDE 0.9 % IV BOLUS (SEPSIS)
1000.0000 mL | Freq: Once | INTRAVENOUS | Status: AC
  Administered 2015-02-05: 1000 mL via INTRAVENOUS

## 2015-02-05 MED ORDER — LORAZEPAM 2 MG/ML IJ SOLN: 0.0000 mg | Freq: Two times a day (BID) | INTRAMUSCULAR | Status: DC

## 2015-02-05 NOTE — ED Notes (Addendum)
Patient here with right shoulder pain.  Patient ran out of his Percocet and is now withdrawing from the med.  Patient took Percocet last this am.  Patient has been drinking alcohol for the pain control.  Patient had right rotator cuff surgery about one month ago.  Patient does have drug and alcohol abuse problems. Patient is showing signs of hopelessness, crying in triage and not answering questions.  He is here with his daughters.

## 2015-02-05 NOTE — ED Provider Notes (Signed)
CSN: [id number]     Arrival date & time 02/05/15  2003 History   First MD Initiated Contact with Patient 02/05/15 2114     Chief Complaint  Patient presents with  . Shoulder Pain  . Suicidal     (Consider location/radiation/quality/duration/timing/severity/associated sxs/prior Treatment) HPI  Patient is a 49 year old male with past medical history significant for alcohol abuse, opioid dependence, COPD, anxiety, depression, who presents to the emergency department for right shoulder pain and suicidal thoughts. Patient reports right-sided shoulder pain since a fall that occurred a couple of months ago, as well as rotator cuff injury and is followed by orthopedics. Denies worsening of right shoulder pain, denies numbness, tingling, weakness of the right upper extremity. Denies recent falls or injury. Patient admits to drinking a couple of beers earlier tonight. Denies daily alcohol use, states he has had DTs in the past, but that was when he was drinking more in the past.    Patient also reported suicidal ideation. He states that his pain is so terrible that he "just wants to end it all" and that he doesn't care if he dies as a result. Reports history of suicide attempts in the past by taking alcohol and opioids. Does not have an active plan for suicide but states he could "figure it out". Denies homicidal ideation. Does report auditory and visual hallucinations. States that he has had these hallucinations for multiple years. Denies any voices telling him to hurt himself or others. He reports that he has seen a psychologist in the past, was prescribed medications but that he has never taken them. Reports a poor diet, and that he will go days without eating.   Patient is also accompanied by his daughters, they report concerns for suicidal ideation. They state that over the past month that they feel that the paranoid auditory hallucinations have become acutely worsened. States that he just holds them  multiple times that he was going to kill himself. One of the daughters states that he threatened to hurt her yesterday. His daughters do not live in the home but that he lives with his mother.    Past Medical History  Diagnosis Date  . 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 ETOH abuse   . Mental disorder   . Irregular heart beat   . Shortness of breath   . Headache(784.0)   . Hepatitis   . Alcohol withdrawal ([place])   . Neuropathy ([place]) 06/07/2013  . 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 Seizure due to alcohol withdrawal ([place]) 01/23/2014    Per patient report  . Anxiety Dx 2014  . GERD (gastroesophageal reflux disease) Dx 2003  . Hyperlipidemia Dx 2000  . Hypertension Dx 2011  . Withdrawal seizures ([place]) 2014  . COPD (chronic obstructive pulmonary disease) [facility])    Past Surgical History  Procedure Laterality Date  . Back surgery    . Neck surgery    . Fundoplasty transthoracic  2003  . Nasal sinus surgery    . Left heart catheterization with coronary angiogram N/A 01/13/2014    Procedure: LEFT HEART CATHETERIZATION WITH CORONARY ANGIOGRAM;  Surgeon: Clent Demark, MD;  Location: [facility] CATH LAB;  Service: Cardiovascular;  Laterality: N/A;  . Subacromial decompression Right 11/16/2014    Procedure: RIGHT SHOULDER ARTHROSCOPY WITH SUBACROMIAL DECOMPRESSION ;  Surgeon: Leandrew Koyanagi, MD;  Location: [facility];  Service: Orthopedics;  Laterality: Right;  . Nissen fundoplication    .  24 hour ph study N/A 01/16/2015    Procedure: 24 HOUR PH STUDY;  Surgeon: Manus Gunning, MD;  Location: [facility] ENDOSCOPY;  Service: Gastroenterology;  Laterality: N/A;   Family History  Problem Relation Age of Onset  . Breast cancer Paternal Grandmother   . Lung cancer Father     was a smoker  . Hypertension Mother   . Colon polyps Mother    Social History  Substance Use Topics  . Smoking status: Current Every Day Smoker -- 0.50 packs/day for 30 years     Types: Cigarettes  . Smokeless tobacco: Never Used     Comment: 11-30-2014 info given  . Alcohol Use: No    Review of Systems  Constitutional: Negative for fever and appetite change.  HENT: Negative for congestion and trouble swallowing.   Eyes: Negative for visual disturbance.  Respiratory: Positive for chest tightness. Negative for shortness of breath and wheezing.   Cardiovascular: Positive for chest pain. Negative for palpitations.  Gastrointestinal: Positive for nausea. Negative for vomiting, abdominal pain, diarrhea and blood in stool.  Genitourinary: Negative for dysuria, hematuria, flank pain and decreased urine volume.  Musculoskeletal: Positive for back pain and arthralgias. Negative for neck pain.  Skin: Negative for rash and wound.  Neurological: Negative for dizziness, seizures, weakness, light-headedness and headaches.  Psychiatric/Behavioral: Positive for suicidal ideas, hallucinations, self-injury and agitation.      Allergies  Lisinopril and Cozaar  Home Medications   Prior to Admission medications   Medication Sig Start Date End Date Taking? Authorizing Provider  albuterol (PROVENTIL HFA;VENTOLIN HFA) 108 (90 BASE) MCG/ACT inhaler Inhale 2 puffs into the lungs every 4 (four) hours as needed for wheezing or shortness of breath. 07/28/14  Yes Josalyn Funches, MD  gabapentin (NEURONTIN) 400 MG capsule Take 2 capsules (800 mg total) by mouth 3 (three) times daily. For substance withdrawal syndrome/paina management 07/28/14  Yes Boykin Nearing, MD  lansoprazole (PREVACID) 15 MG capsule Take 15 mg by mouth daily as needed (stomach upset).    Yes Historical Provider, MD  metoprolol (LOPRESSOR) 50 MG tablet Take 1 tablet (50 mg total) by mouth 2 (two) times daily. 07/04/14  Yes Josalyn Funches, MD  QUEtiapine (SEROQUEL) 100 MG tablet Take 75 mg by mouth at bedtime. 3/4 tablet - one hour before bedtime 11/17/14  Yes Historical Provider, MD  acetaminophen-codeine (TYLENOL #3)  300-30 MG per tablet Take 1 tablet by mouth 2 (two) times daily as needed for moderate pain. Patient not taking: Reported on 12/19/2014 11/03/14   Boykin Nearing, MD  cyclobenzaprine (FLEXERIL) 10 MG tablet Take 1 tablet (10 mg total) by mouth 2 (two) times daily as needed for muscle spasms. Patient not taking: Reported on 02/05/2015 06/22/14   Gerda Diss, MD  oxyCODONE (OXY IR/ROXICODONE) 5 MG immediate release tablet Take 10 mg by mouth every 3 (three) hours as needed (pain).  02/02/15   Historical Provider, MD  oxyCODONE-acetaminophen (PERCOCET) 5-325 MG per tablet Take 1-2 tablets by mouth every 4 (four) hours as needed for severe pain. Patient not taking: Reported on 02/05/2015 11/16/14   Leandrew Koyanagi, MD  tamsulosin (FLOMAX) 0.4 MG CAPS capsule Take 2 capsules (0.8 mg total) by mouth daily after supper. Patient not taking: Reported on 02/05/2015 12/19/14   Josalyn Funches, MD   BP 128/73 mmHg  Pulse 75  Temp(Src) 97.5 F (36.4 C) (Oral)  Resp 23  SpO2 93% Physical Exam  Constitutional: He is oriented to person, place, and time. He appears well-developed  and well-nourished.  HENT:  Head: Normocephalic and atraumatic.  Mouth/Throat: Oropharynx is clear and moist.  Eyes: Conjunctivae and EOM are normal. Pupils are equal, round, and reactive to light.  Neck: Normal range of motion. Neck supple. No JVD present.  Cardiovascular: Normal rate, regular rhythm, normal heart sounds and intact distal pulses.   Pulmonary/Chest: Effort normal. No respiratory distress. He has wheezes (diffuse expiratory). He exhibits no tenderness.  Abdominal: Soft. He exhibits no distension. There is no tenderness. There is no rebound and no guarding.  Musculoskeletal:       Right shoulder: He exhibits decreased range of motion and tenderness. He exhibits no swelling, no effusion, no deformity, normal pulse and normal strength.  Neurological: He is alert and oriented to person, place, and time. GCS eye subscore  is 4. GCS verbal subscore is 5. GCS motor subscore is 6.  Skin: Skin is warm.  Psychiatric: His speech is normal. His affect is labile. He is agitated, aggressive and actively hallucinating. Thought content is paranoid. Cognition and memory are normal. He expresses suicidal ideation. He expresses no homicidal ideation. He expresses no homicidal plans.  Nursing note and vitals reviewed.   ED Course  Procedures (including critical care time) Labs Review Labs Reviewed  COMPREHENSIVE METABOLIC PANEL - Abnormal; Notable for the following:    Potassium 2.5 (*)    CO2 17 (*)    Glucose, Bld 140 (*)    All other components within normal limits  ETHANOL - Abnormal; Notable for the following:    Alcohol, Ethyl (B) 193 (*)    All other components within normal limits  ACETAMINOPHEN LEVEL - Abnormal; Notable for the following:    Acetaminophen (Tylenol), Serum <10 (*)    All other components within normal limits  CBC - Abnormal; Notable for the following:    WBC 10.6 (*)    All other components within normal limits  I-STAT CG4 LACTIC ACID, ED - Abnormal; Notable for the following:    Lactic Acid, Venous 2.40 (*)    All other components within normal limits  I-STAT VENOUS BLOOD GAS, ED - Abnormal; Notable for the following:    pH, Ven 7.411 (*)    pCO2, Ven 38.1 (*)    pO2, Ven 47.0 (*)    Bicarbonate 24.2 (*)    All other components within normal limits  SALICYLATE LEVEL  URINE RAPID DRUG SCREEN, HOSP PERFORMED  MAGNESIUM  BLOOD GAS, VENOUS    Imaging Review No results found. I have personally reviewed and evaluated these images and lab results as part of my medical decision-making.   EKG Interpretation   Date/Time:  Sunday February 05 2015 22:05:20 EST Ventricular Rate:  82 PR Interval:  159 QRS Duration: 113 QT Interval:  422 QTC Calculation: 493 R Axis:   63 Text Interpretation:  Sinus rhythm Ventricular trigeminy Borderline  intraventricular conduction delay Borderline  prolonged QT interval No  acute changes Confirmed by Kathrynn Humble, MD, Thelma Comp [phone]) on 02/05/2015  11:07:35 PM      MDM   Final diagnoses:  None   Patient is a 49 year old male with past medical history significant for alcohol abuse, opioid dependence, COPD, anxiety, depression, who presents to the emergency department for right shoulder pain and suicidal thoughts. On arrival, patient is agitated, complaining of diffuse pain. Afebrile, hemodynamically stable. Exhibited above, notable for right shoulder tenderness to palpation with decreased range of motion, neurovascularly intact, lungs CTAB, RRR, intact distal pulses.   Patient appears acutely intoxicated. Does not  have signs or symptoms of withdrawal at this time.   Patient found to have lactic acidosis (2.4) with hypokalemia (K 2.5), likely secondary to chronic alcohol use and poor diet. No other significant electrolyte abnormalities, magnesium 2.3. Ethanol level 193. UDS negative.  Patient without any other signs of acute infectious process, doubt lactic acidosis secondary to an infection.  Patient's right shoulder pain likely secondary to his chronic pain from recent rotator cuff injury. No new injury or falls. Do not feel that repeat imaging is necessary at this time.  Given IV fluids, thiamine, potassium. Given pain medication for his chronic pain.  Placed on CIWA protocol.   Patient placed on suicide precautions, sitter at bedside. Became acutely agitated, given Haldol 2 mg with resolution of his agitation.   Patient will need repeat potassium and CMP after replacement. Patient will also need to metabolize before evaluation by psychiatry. Patient checked out to Dr.Nanavati at 2400, please see their note for further detail and re-evaluation.     Nathaniel Man, MD 02/06/15 0001  Blanchie Dessert, MD 02/07/15 2122

## 2015-02-06 LAB — BASIC METABOLIC PANEL
ANION GAP: 8 (ref 5–15)
BUN: 7 mg/dL (ref 6–20)
CO2: 24 mmol/L (ref 22–32)
CREATININE: 0.81 mg/dL (ref 0.61–1.24)
Calcium: 9.4 mg/dL (ref 8.9–10.3)
Chloride: 108 mmol/L (ref 101–111)
GFR calc non Af Amer: >60 mL/min (ref >60)
GLUCOSE: 110 mg/dL — AB (ref 65–99)
POTASSIUM: 4.4 mmol/L (ref 3.5–5.1)
Sodium: 140 mmol/L (ref 135–145)

## 2015-02-06 LAB — I-STAT CG4 LACTIC ACID, ED: LACTIC ACID, VENOUS: 1.16 mmol/L (ref 0.5–2.0)

## 2015-02-06 MED ORDER — POTASSIUM CHLORIDE CRYS ER 20 MEQ PO TBCR
60.0000 meq | EXTENDED_RELEASE_TABLET | Freq: Once | ORAL | Status: AC
Start: 2015-02-06 — End: 2015-02-06
  Administered 2015-02-06: 60 meq via ORAL
  Filled 2015-02-06: qty 3

## 2015-02-06 MED ORDER — ALBUTEROL SULFATE HFA 108 (90 BASE) MCG/ACT IN AERS: 2.0000 | INHALATION_SPRAY | RESPIRATORY_TRACT | Status: DC | PRN

## 2015-02-06 MED ORDER — TAMSULOSIN HCL 0.4 MG PO CAPS
0.8000 mg | ORAL_CAPSULE | Freq: Every day | ORAL | Status: DC
  Administered 2015-02-06: 0.8 mg via ORAL
  Filled 2015-02-06: qty 2

## 2015-02-06 MED ORDER — METOPROLOL TARTRATE 25 MG PO TABS
50.0000 mg | ORAL_TABLET | Freq: Two times a day (BID) | ORAL | Status: DC
  Administered 2015-02-06 – 2015-02-07 (×3): 50 mg via ORAL
  Filled 2015-02-06 (×3): qty 2

## 2015-02-06 MED ORDER — GABAPENTIN 400 MG PO CAPS
800.0000 mg | ORAL_CAPSULE | Freq: Three times a day (TID) | ORAL | Status: DC
  Administered 2015-02-06 – 2015-02-07 (×3): 800 mg via ORAL
  Filled 2015-02-06 (×3): qty 2

## 2015-02-06 MED ORDER — OXYCODONE HCL 5 MG PO TABS
5.0000 mg | ORAL_TABLET | ORAL | Status: DC | PRN
  Administered 2015-02-06 – 2015-02-07 (×4): 5 mg via ORAL
  Filled 2015-02-06 (×4): qty 1

## 2015-02-06 MED ORDER — IBUPROFEN 800 MG PO TABS
800.0000 mg | ORAL_TABLET | Freq: Once | ORAL | Status: AC
  Administered 2015-02-06: 800 mg via ORAL
  Filled 2015-02-06: qty 1

## 2015-02-06 MED ORDER — OXYCODONE HCL 5 MG PO TABS
5.0000 mg | ORAL_TABLET | Freq: Once | ORAL | Status: AC
Start: 2015-02-06 — End: 2015-02-06
  Administered 2015-02-06: 5 mg via ORAL
  Filled 2015-02-06: qty 1

## 2015-02-06 MED ORDER — QUETIAPINE FUMARATE 25 MG PO TABS
75.0000 mg | ORAL_TABLET | Freq: Every day | ORAL | Status: DC
  Administered 2015-02-06: 75 mg via ORAL
  Filled 2015-02-06: qty 3

## 2015-02-06 NOTE — ED Provider Notes (Signed)
15:05- . I was asked by nursing, to order his home medications.    Review of the [place] indicates a pattern of drug diversion behavior using multiple types of narcotics, and multiple providers, chills filled over short durations of time recently.   "Diversion" means the transfer of a controlled substance from a lawful to an unlawful channel of distribution or use.  Uniform Controlled Substances Act (1994) ( Drug Abuse Prevention and Control 21 USC Chapter 13 (2011) http://uscode.PrankCrew.uy.txt Accessed May 28, 2010.)  "Diversion" means "Any criminal act involving a prescription drug."  National Association of Drug Diversion InvestigatorsBig Lots of Drug Diversion Investigators (NADDI) Web site http://naddi.associationdatabase.com/aws/NADDI/pt/sp/Home_Page Accessed May 28, 2010.)   15:25- . I discussed the findings with Dr.Xu, his orthopedist, including notation of rebounder oxycodone 5 mg tablets, prescribed and given all the last 5 weeks. The doctor states that he in fact has written knees, for the patient's unrelenting pain. He recommends while in the emergency department that the patient be treated with oxycodone immediate release 5-10 mg every 4 hours when necessary pain. At this time the patient states he is no longer suicidal. He states, "it was the alcohol talking." He states his shoulder is hurting because he ran out of his pain medicine yesterday. Repeat evaluation by TTS, is pending.  Daleen Bo, MD 02/06/15 1536

## 2015-02-06 NOTE — ED Notes (Signed)
Regular diet order taken for lunch.

## 2015-02-06 NOTE — BH Assessment (Addendum)
Tele Assessment Note   Mark [name] is an 49 y.o.divorces male  Who was brought to the ED today by his daughters after reporting intense pain and running out of his pain medication . Information was obtained for this assessment from pt, hospital records and all available resources. Pt sts that he has had passive SI without plan or true intent.  Pt sts that he "wants to but can't." Pt sts "my faith won't let me do it."  Pt sts "I do pray that I go to sleep and don't wake up 'cause of pain." Pt sts he has not made suicide attempts in the past. Per records, pt told ED staff that he has attempted suicide previously.  Pt did report that he has twice drunk 1/2 gallon of liquor in less than 1 hour without confirming that he was trying to kill himself through alcohol poisoning. Pt sts that the last time he did this was about 1 year ago. Pt sts that he "hates this time of year" and gets depressed.  Pt sts that his wedding anniversary and holidays are in the fall and winter and both depress him. Pt sts that he has experienced AVH for "many years" and sts he sees moving shadows and hears voices calling his name and whispering faintly. Pt sts that he has these AVH when sober as well as when intoxicated. Pt sts current stressors are chronic pain and anxiety including panic attacks about every 5-10 days per pt. Pt sts he cannot identify any triggers for his attacks. Pt reports no history of physical, sexual or emotional/verbal abuse.  Symptoms of anxiety include panic attacks, intrusive thoughts, excessive worry, restlessness, difficulty concentrating, irritability, sleep disturbances, nightmares. Symptoms of depression include deep sadness, fatigue, excessive guilt, decreased self esteem, tearfulness & crying spells, self isolation, lack of motivation for activities and pleasure, irritability, negative outlook, difficulty thinking & concentrating, feeling helpless and hopeless, and sleep disturbances. Pt denies anger  outburst or aggressive threats or acts. Pt reports a history of substance abuse including opioid dependence and alcohol dependence . Pt reports current use of alcohol, opioids and nicotine. Pt sts that he has had alcohol related seizures and blackouts in the past. Pt sts that he has had no past or current legal issues.   Pt sts he lives with his mother.  Pt sts he has two daughters ages 72 and 42 yo who accompanied him to the hospital today. Pt sts that he completed his GED, does not currently work but worked previously as a Development worker, community. Pt reports one Inpatient admission beginning 02/03/14. Pt reports Outpatient treatment previously having initiated services with various providers multiple times without followthrough. Most recently pt sts that he contacted [facility] about 3 weeks ago for an appointment but did not follow-up.  Pt has a medically hx including hepatitis, cardiomyopathy, hypertension, chronic pain, COPD, neck & back surgeries and ventricular tachycardia. Pt sts that he has been previously dx'd with Bipolar Disorder and records show dxs of Anxiety, Depression and polysubstance abuse. Pt sts he currently sees Dr. Adrian Blackwater for medication management and is not currently seeing a therapist. Pt was admitted to [facility] last year, 02/03/14, for similar symptoms.   Pt was dressed in scrubs and lying in his hospital bed. Pt was alert, cooperative and pleasant. Pt kept good eye contact, spoke in a clear tone and normal pace. Pt moved in a normal manner when moving. Pt's thought process was coherent and relevant but judgement was impaired.  Pt's mood  was depressed and his blunted affect was congruent.  Pt was oriented x 4, to person, place, time and situation.   Diagnosis: 311 Unspecified Depressive Disorder; 303.90 Alcohol Use Disorder, Severe; Anxiety by hx; Opioid Dependence by hx;   Past Medical History:  Past Medical History  Diagnosis Date  . Drug abuse     pt reports opioid dependence due to previous  back surgeries  . ETOH abuse   . Mental disorder   . Irregular heart beat   . Shortness of breath   . Headache(784.0)   . Hepatitis   . Alcohol withdrawal ([place])   . Neuropathy ([place]) 06/07/2013  . 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 Seizure due to alcohol withdrawal ([place]) 01/23/2014    Per patient report  . Anxiety Dx 2014  . GERD (gastroesophageal reflux disease) Dx 2003  . Hyperlipidemia Dx 2000  . Hypertension Dx 2011  . Withdrawal seizures ([place]) 2014  . COPD (chronic obstructive pulmonary disease) [facility])     Past Surgical History  Procedure Laterality Date  . Back surgery    . Neck surgery    . Fundoplasty transthoracic  2003  . Nasal sinus surgery    . Left heart catheterization with coronary angiogram N/A 01/13/2014    Procedure: LEFT HEART CATHETERIZATION WITH CORONARY ANGIOGRAM;  Surgeon: Clent Demark, MD;  Location: [facility] CATH LAB;  Service: Cardiovascular;  Laterality: N/A;  . Subacromial decompression Right 11/16/2014    Procedure: RIGHT SHOULDER ARTHROSCOPY WITH SUBACROMIAL DECOMPRESSION ;  Surgeon: Leandrew Koyanagi, MD;  Location: [facility];  Service: Orthopedics;  Laterality: Right;  . Nissen fundoplication    . 24 hour ph study N/A 01/16/2015    Procedure: 24 HOUR PH STUDY;  Surgeon: Manus Gunning, MD;  Location: [facility] ENDOSCOPY;  Service: Gastroenterology;  Laterality: N/A;    Family History:  Family History  Problem Relation Age of Onset  . Breast cancer Paternal Grandmother   . Lung cancer Father     was a smoker  . Hypertension Mother   . Colon polyps Mother     Social History:  reports that he has been smoking Cigarettes.  He has a 15 pack-year smoking history. He has never used smokeless tobacco. He reports that he does not drink alcohol or use illicit drugs.  Additional Social History:  Alcohol / Drug Use Prescriptions: See PTA list History of alcohol / drug use?: Yes Longest period of sobriety (when/how long): last year for 1  year Substance #1 Name of Substance 1: Alcohol 1 - Age of First Use: 40 1 - Amount (size/oz): "don't really know" 1 - Frequency: 2 x month per pt 1 - Duration: since 49 yo- ongoing 1 - Last Use / Amount: today Substance #2 Name of Substance 2: Opioids: Oxycodone 2 - Amount (size/oz): "as prescribed" 2 - Frequency: daily 2 - Duration: ongoing for pain 2 - Last Use / Amount: "don't know" Substance #3 Name of Substance 3: Nicotine 3 - Age of First Use: 10 3 - Amount (size/oz): 1/2 - 1 pack  3 - Frequency: daily 3 - Duration: ongoing 3 - Last Use / Amount: today Substance #4 Name of Substance 4: Marijuana 4 - Age of First Use: "don't remember' 4 - Amount (size/oz): "don't know" 4 - Last Use / Amount: when 27-28 yo  CIWA: CIWA-Ar BP: 128/73 mmHg Pulse Rate: 75 Nausea and Vomiting: no nausea and no vomiting Tactile Disturbances: none Tremor: no tremor Auditory Disturbances: not present Paroxysmal  Sweats: no sweat visible Visual Disturbances: mild sensitivity Anxiety: no anxiety, at ease Headache, Fullness in Head: mild Agitation: somewhat more than normal activity Orientation and Clouding of Sensorium: oriented and can do serial additions CIWA-Ar Total: 5 COWS:    PATIENT STRENGTHS: (choose at least two) Average or above average intelligence Communication skills Supportive family/friends  Allergies:  Allergies  Allergen Reactions  . Lisinopril Anaphylaxis and Swelling    Per Dr. Terrence Dupont (12/20/13), patient can tolerate losartan.  . Cozaar [Losartan] Swelling    Caused swelling per pt    Home Medications:  (Not in a hospital admission)  OB/GYN Status:  No LMP for male patient.  General Assessment Data Location of Assessment: [facility] ED Is this a Tele or Face-to-Face Assessment?: Tele Assessment Is this an Initial Assessment or a Re-assessment for this encounter?: Initial Assessment Marital status: Divorced [place] name: na Is patient pregnant?: No Pregnancy  Status: No Living Arrangements: Parent (sts lives with his mother) Can pt return to current living arrangement?: Yes Admission Status: Voluntary Is patient capable of signing voluntary admission?: Yes Referral Source: Self/Family/Friend Insurance type: [place] Screening Exam ([place]) Medical Exam completed: Yes  Crisis Care Plan Living Arrangements: Parent (sts lives with his mother) Name of Psychiatrist: Dr. Adrian Blackwater Name of Therapist: none  Education Status Is patient currently in school?: No Current Grade: na Highest grade of school patient has completed:  (GED and some college) Name of school: na Contact person: na  Risk to self with the past 6 months Suicidal Ideation: No-Not Currently/Within Last 6 Months (earlier today, not currently per pt) Has patient been a risk to self within the past 6 months prior to admission? : Yes (sts he has passive SI periodically) Suicidal Intent: No (denies, sts "want to but can't") Has patient had any suicidal intent within the past 6 months prior to admission? : No Is patient at risk for suicide?: No Suicidal Plan?: No (denies) Has patient had any suicidal plan within the past 6 months prior to admission? : No Access to Means: No (denies access to firearms) What has been your use of drugs/alcohol within the last 12 months?: regular use Previous Attempts/Gestures: No (denies attempt) How many times?: 0 Other Self Harm Risks:  (binges drinking liquor; drinks 1/2 gal. 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 : No (denies) Thoughts of Harm to Others: Yes-Currently Present (sts he thinks he might harm anyone who "gets in his face") Comment - Thoughts of Harm to Others: sts pain causes irritability & thoughts of possibly harming others Current Homicidal Intent: No (denies) Current Homicidal Plan: No (denies) Access to Homicidal Means: No (denies) Identified Victim: na History of harm to others?: No (denies) Assessment of Violence: None Noted Does patient have access to weapons?: No (denies) Criminal Charges Pending?: No (denies) Does patient have a court date: No Is patient on probation?: No (denies)  Psychosis Hallucinations: Auditory, Visual (hears voices;  sees shadows- when sober) Delusions: None noted  Mental Status Report Appearance/Hygiene: Disheveled, In scrubs Eye Contact: Good Motor Activity: Restlessness Speech: Logical/coherent Level of Consciousness: Alert Mood: Pleasant, Depressed Affect: Blunted Anxiety Level: Minimal Thought Processes: Coherent, Relevant Judgement: Impaired Orientation: Person, Place, Time, Situation Obsessive Compulsive Thoughts/Behaviors: None  Cognitive Functioning Concentration: Fair Memory: Recent Intact, Remote Intact IQ: Average Insight: Fair Impulse Control: Poor Appetite: Good Weight Loss: 0 Weight Gain: 0 Sleep: Decreased Total Hours of Sleep: 6 (interrupted) Vegetative Symptoms: None  ADLScreening [facility] Assessment Services) Patient's cognitive ability adequate to safely complete daily activities?: Yes Patient able to express need for assistance with ADLs?: Yes Independently performs ADLs?: Yes (appropriate for developmental  age)  Prior Inpatient Therapy Prior Inpatient Therapy: Yes Prior Therapy Dates: 01/2014 Prior Therapy Facilty/Provider(s): [facility] Reason for Treatment: SI  Prior Outpatient Therapy Prior Outpatient Therapy: Yes Prior Therapy Dates: for short periods recently- a few visits Prior Therapy Facilty/Provider(s): [facility] and others Reason for Treatment: SI, Anxiety, Bipolar (Pt sts that he has been diagnosed Bipolar) Does patient have an ACCT team?: No Does patient have Intensive In-House Services?  : No Does patient have [facility] services? : No (not currently,but formerly) Does patient have [facility] services?: No  ADL Screening (condition at time of admission) Patient's cognitive ability adequate to safely complete daily activities?: Yes Patient able to express need for assistance with ADLs?: Yes Independently performs ADLs?: Yes (appropriate for developmental age)       Abuse/Neglect Assessment (Assessment to be complete while patient is alone) Physical Abuse: Denies Verbal Abuse: Denies Sexual Abuse: Denies Exploitation of patient/patient's resources: Denies Self-Neglect: Denies     Regulatory affairs officer (For Healthcare) Does patient have an advance directive?: No Would patient like information on creating an advanced directive?: No - patient declined information    Additional Information 1:1 In Past 12 Months?: No CIRT Risk: No Elopement Risk: No Does patient have medical clearance?: Yes     Disposition:  Disposition Initial Assessment Completed for this Encounter: Yes Disposition of Patient: Other dispositions (Pending review w [place]) Other disposition(s): Other (Comment)  Per Nathaniel Man, MD, at [facility]:  Pt not medically cleared at this time.  Order was removed after assessment completed.  Discussed with Serena Colonel, NP: Cannot disposition as pt is not medically cleared.  Will have to re-assess once medically cleared.   Faylene Kurtz, MS, CRC, [place] Triage  Specialist [facility] T 02/06/2015 12:01 AM

## 2015-02-07 DIAGNOSIS — M25511 Pain in right shoulder: Secondary | ICD-10-CM

## 2015-02-07 DIAGNOSIS — F3130 Bipolar disorder, current episode depressed, mild or moderate severity, unspecified: Secondary | ICD-10-CM

## 2015-02-07 MED ORDER — LORAZEPAM 1 MG PO TABS: 0.0000 mg | ORAL_TABLET | Freq: Two times a day (BID) | ORAL | Status: DC

## 2015-02-07 MED ORDER — LORAZEPAM 1 MG PO TABS: 0.0000 mg | ORAL_TABLET | Freq: Four times a day (QID) | ORAL | Status: DC

## 2015-02-07 NOTE — Discharge Instructions (Signed)
Alcohol Intoxication  Alcohol intoxication occurs when the amount of alcohol that a person has consumed impairs his or her ability to mentally and physically function. Alcohol directly impairs the normal chemical activity of the brain. Drinking large amounts of alcohol can lead to changes in mental function and behavior, and it can cause many physical effects that can be harmful.   Alcohol intoxication can range in severity from mild to very severe. Various factors can affect the level of intoxication that occurs, such as the person's age, gender, weight, frequency of alcohol consumption, and the presence of other medical conditions (such as diabetes, seizures, or heart conditions). Dangerous levels of alcohol intoxication may occur when people drink large amounts of alcohol in a short period (binge drinking). Alcohol can also be especially dangerous when combined with certain prescription medicines or "recreational" drugs.  SIGNS AND SYMPTOMS  Some common signs and symptoms of mild alcohol intoxication include:  · Loss of coordination.  · Changes in mood and behavior.  · Impaired judgment.  · Slurred speech.  As alcohol intoxication progresses to more severe levels, other signs and symptoms will appear. These may include:  · Vomiting.  · Confusion and impaired memory.  · Slowed breathing.  · Seizures.  · Loss of consciousness.  DIAGNOSIS   Your health care provider will take a medical history and perform a physical exam. You will be asked about the amount and type of alcohol you have consumed. Blood tests will be done to measure the concentration of alcohol in your blood. In many places, your blood alcohol level must be lower than 80 mg/dL (0.08%) to legally drive. However, many dangerous effects of alcohol can occur at much lower levels.   TREATMENT   People with alcohol intoxication often do not require treatment. Most of the effects of alcohol intoxication are temporary, and they go away as the alcohol naturally  leaves the body. Your health care provider will monitor your condition until you are stable enough to go home. Fluids are sometimes given through an IV access tube to help prevent dehydration.   HOME CARE INSTRUCTIONS  · Do not drive after drinking alcohol.  · Stay hydrated. Drink enough water and fluids to keep your urine clear or pale yellow. Avoid caffeine.    · Only take over-the-counter or prescription medicines as directed by your health care provider.    SEEK MEDICAL CARE IF:   · You have persistent vomiting.    · You do not feel better after a few days.  · You have frequent alcohol intoxication. Your health care provider can help determine if you should see a substance use treatment counselor.  SEEK IMMEDIATE MEDICAL CARE IF:   · You become shaky or tremble when you try to stop drinking.    · You shake uncontrollably (seizure).    · You throw up (vomit) blood. This may be bright red or may look like black coffee grounds.    · You have blood in your stool. This may be bright red or may appear as a black, tarry, bad smelling stool.    · You become lightheaded or faint.    MAKE SURE YOU:   · Understand these instructions.  · Will watch your condition.  · Will get help right away if you are not doing well or get worse.     This information is not intended to replace advice given to you by your health care provider. Make sure you discuss any questions you have with your health care provider.       Document Released: 12/05/2004 Document Revised: 10/28/2012 Document Reviewed: 07/31/2012  Elsevier Interactive Patient Education ©2016 [employer]

## 2015-02-07 NOTE — ED Provider Notes (Signed)
Patient cleared for discharge by psychiatry. Denies any suicidal or homicidal thoughts.  BP 146/95 mmHg  Pulse 79  Temp(Src) 97.6 F (36.4 C) (Oral)  Resp 18  SpO2 96%   Ezequiel Essex, MD 02/07/15 1317

## 2015-02-07 NOTE — ED Notes (Signed)
Patient was given a coke. 

## 2015-02-07 NOTE — ED Notes (Signed)
Pt asking when he may leave d/t his ride is present. Advised pt after NP, [facility], enters note and EDP agrees. Voiced understanding.

## 2015-02-07 NOTE — ED Notes (Signed)
Dr Rancour in w/pt. 

## 2015-02-07 NOTE — ED Notes (Signed)
Pt on phone at nurses' desk calling someone for ride - advised pt of visiting times until receive d/c paperwork.

## 2015-02-07 NOTE — ED Notes (Signed)
When pt was given belongings bag, he lifted bag w/right arm - no complaints from pt.

## 2015-02-07 NOTE — ED Notes (Addendum)
STATES "I'M NOT SUICIDAL UNLESS IT'S GOING TO INTERFERE W/ME GETTING MY PAIN MEDICINE." WHEN RN QUESTIONED THIS STATEMENT, PT STATES "I'M JUST KIDDING". Pt asking for pain med at this time - advised not time d/t received med at 0705.

## 2015-02-07 NOTE — ED Notes (Addendum)
Drink and ice cream given to patient, and Regular diet order taken for lunch.

## 2015-02-07 NOTE — ED Notes (Signed)
Telepsych being performed. 

## 2015-02-07 NOTE — ED Notes (Signed)
Bathroom break provided to sitter by RN.

## 2015-02-07 NOTE — ED Notes (Signed)
Patient ambulated to shower and tolerated well.  Patient told sitter that he needed assistance with bath because of previous surgery.

## 2015-02-07 NOTE — ED Notes (Addendum)
Pt on phone at nurses' desk calling dr's office asking for Oxycodone refill. Pt noted to be using right hand/arm w/no problems in dialing phone and writing - pt writing w/right hand.

## 2015-02-07 NOTE — ED Notes (Signed)
Pt continuously asking for pain med. Advised pt x 3 - not time yet.

## 2015-02-07 NOTE — Consult Note (Signed)
Telepsych Consultation   Reason for Consult:  Discharge Disposition  Referring Physician: EDP Patient Identification: Mark [name] MRN:  [id number] Principal Diagnosis: Pain in joint, shoulder region Diagnosis:   Patient Active Problem List   Diagnosis Date Noted  . Chronic anxiety [F41.9] 12/19/2014  . Tender prostate [N42.81] 09/16/2014  . Erectile dysfunction [N52.9] 09/16/2014  . Poor dentition [K08.9] 07/28/2014  . Chronic maxillary sinusitis [J32.0] 07/28/2014  . Vitamin D insufficiency [E55.9] 07/05/2014  . Closed fracture of 5th metacarpal [S62.308A] 07/04/2014  . Chronic fatigue [R53.82] 07/04/2014  . Chronic low back pain [M54.5, G89.29] 06/02/2014  . Pain in joint, shoulder region [M25.519] 05/12/2014  . Bipolar depression ([place]) [F31.30] 02/08/2014  . Neuropathy ([place]) [G62.9] 02/08/2014  . Long Q-T syndrome [I45.81] 01/23/2014  . Opioid dependence ([place]) [F11.20] 01/22/2014    Class: Acute  . Paroxysmal VT ([place]) [I47.2] 12/19/2013  . Chronic pain syndrome [G89.4] 12/28/2012  . 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 Tobacco abuse [Z72.0] 01/19/2012  . Hypertension [I10]   . Hyperlipidemia [E78.5]     Total Time spent with patient: 30 minutes  Subjective:   Mark [name] is a 49 y.o. male patient admitted with intense right shoulder pain after running out of his pain medications.   HPI:    Mark [name] is an 49 y.o.divorces malewho was brought to the ED  by his daughters after reporting intense pain and running out of his pain medication. The patient had expressed passive SI without plan or true intent. Patient reports that his primary stressor has been increased pain since a shoulder surgery earlier this year. He has consistently denied any past suicide attempts or current ideation. Patient states today during assessment "I feel pretty good. My pain is better. I'm not really depressed. I need to get my medication refilled by my  orthopedic surgeon. My mood has been pretty good lately despite the pain it's just that it got so bad when I ran out. That was really what was going on and pain makes a person say some really strange things." Patient reports a history of Bipolar Depression but is not able to describe a history of any true manic episodes. When ask to describe such an event the patient replies "I just isolate myself in the house and have no motivation." Mark [name] denies feeling that way today and is preparing to leave the hospital in order to continue his pain management with outside Provider. During the assessment there is not evidence of psychosis or suicidal ideation. On admission his uds was negative for any substances. According to notes from ED the patient has been very focused on getting his pain medications as scheduled. Dr. Eulis Foster reviewed the [facility] Substance Reporting System which indicated the patient may have have pattern of using narcotics with multiple providers. Patient has been receiving oxycodone in the ED after this was verified with Dr. Erlinda Hong his orthopedist. Patient is documented to have denied suicidal ideation several times during his admission.   HPI Elements:   Location:  Depression related to acute pain. Quality:  Recently ran out of oxycodone due to overuse . Severity:  Moderate . Timing:  Last few weeks. Duration:  "Started earlier this year with a shoulder injury." . Context:  Chronic shoulder pain being managed with opiates, No current psychiatric symptoms.  Past Medical History:  Past Medical History  Diagnosis Date  . Drug abuse     pt reports opioid dependence due to previous back  surgeries  . ETOH abuse   . Mental disorder   . Irregular heart beat   . Shortness of breath   . Headache(784.0)   . Hepatitis   . Alcohol withdrawal ([place])   . Neuropathy ([place]) 06/07/2013  . 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 Seizure due to alcohol withdrawal ([place]) 01/23/2014    Per patient report  .  Anxiety Dx 2014  . GERD (gastroesophageal reflux disease) Dx 2003  . Hyperlipidemia Dx 2000  . Hypertension Dx 2011  . Withdrawal seizures ([place]) 2014  . COPD (chronic obstructive pulmonary disease) [facility])     Past Surgical History  Procedure Laterality Date  . Back surgery    . Neck surgery    . Fundoplasty transthoracic  2003  . Nasal sinus surgery    . Left heart catheterization with coronary angiogram N/A 01/13/2014    Procedure: LEFT HEART CATHETERIZATION WITH CORONARY ANGIOGRAM;  Surgeon: Clent Demark, MD;  Location: [facility] CATH LAB;  Service: Cardiovascular;  Laterality: N/A;  . Subacromial decompression Right 11/16/2014    Procedure: RIGHT SHOULDER ARTHROSCOPY WITH SUBACROMIAL DECOMPRESSION ;  Surgeon: Leandrew Koyanagi, MD;  Location: [facility];  Service: Orthopedics;  Laterality: Right;  . Nissen fundoplication    . 24 hour ph study N/A 01/16/2015    Procedure: 24 HOUR PH STUDY;  Surgeon: Manus Gunning, MD;  Location: [facility] ENDOSCOPY;  Service: Gastroenterology;  Laterality: N/A;   Family History:  Family History  Problem Relation Age of Onset  . Breast cancer Paternal Grandmother   . Lung cancer Father     was a smoker  . Hypertension Mother   . Colon polyps Mother    Social History:  History  Alcohol Use No     History  Drug Use No    Comment: Opana    Social History   Social History  . Marital Status: Divorced    Spouse Name: N/A  . Number of Children: 2  . Years of Education: N/A   Occupational History  . [employer]     Social History Main Topics  . Smoking status: Current Every Day Smoker -- 0.50 packs/day for 30 years    Types: Cigarettes  . Smokeless tobacco: Never Used     Comment: 11-30-2014 info given  . Alcohol Use: No  . Drug Use: No     Comment: Opana  . Sexual Activity: No   Other Topics Concern  . None   Social History Narrative   Additional Social History:    Prescriptions: See PTA list History of alcohol / drug use?:  Yes Longest period of sobriety (when/how long): last year for 1 year Name of Substance 1: Alcohol 1 - Age of First Use: 40 1 - Amount (size/oz): "don't really know" 1 - Frequency: 2 x month per pt 1 - Duration: since 50 yo- ongoing 1 - Last Use / Amount: today Name of Substance 2: Opioids: Oxycodone 2 - Amount (size/oz): "as prescribed" 2 - Frequency: daily 2 - Duration: ongoing for pain 2 - Last Use / Amount: "don't know" Name of Substance 3: Nicotine 3 - Age of First Use: 10 3 - Amount (size/oz): 1/2 - 1 pack  3 - Frequency: daily 3 - Duration: ongoing 3 - Last Use / Amount: today Name of Substance 4: Marijuana 4 - Age of First Use: "don't remember' 4 - Amount (size/oz): "don't know" 4 - Last Use / Amount: when 27-28 yo  Allergies:   Allergies  Allergen Reactions  . Lisinopril Anaphylaxis and Swelling    Per Dr. Terrence [facility] (12/20/13), patient can tolerate losartan.  . Cozaar [Losartan] Swelling    Caused swelling per pt    Labs:  Results for orders placed or performed during the hospital encounter of 02/05/15 (from the past 48 hour(s))  Urine rapid drug screen (hosp performed) (Not at [facility])     Status: None   Collection Time: 02/05/15  8:15 PM  Result Value Ref Range   Opiates NONE DETECTED NONE DETECTED   Cocaine NONE DETECTED NONE DETECTED   Benzodiazepines NONE DETECTED NONE DETECTED   Amphetamines NONE DETECTED NONE DETECTED   Tetrahydrocannabinol NONE DETECTED NONE DETECTED   Barbiturates NONE DETECTED NONE DETECTED    Comment:        DRUG SCREEN FOR MEDICAL PURPOSES ONLY.  IF CONFIRMATION IS NEEDED FOR ANY PURPOSE, NOTIFY LAB WITHIN 5 DAYS.        LOWEST DETECTABLE LIMITS FOR URINE DRUG SCREEN Drug Class       Cutoff (ng/mL) Amphetamine      1000 Barbiturate      200 Benzodiazepine   161 Tricyclics       096 Opiates          300 Cocaine          300 THC              50   Ethanol (ETOH)     Status: Abnormal   Collection Time: 02/05/15   8:48 PM  Result Value Ref Range   Alcohol, Ethyl (B) 193 (H) <5 mg/dL    Comment:        LOWEST DETECTABLE LIMIT FOR SERUM ALCOHOL IS 5 mg/dL FOR MEDICAL PURPOSES ONLY   Salicylate level     Status: None   Collection Time: 02/05/15  8:48 PM  Result Value Ref Range   Salicylate Lvl <0.4 2.8 - 30.0 mg/dL  Acetaminophen level     Status: Abnormal   Collection Time: 02/05/15  8:48 PM  Result Value Ref Range   Acetaminophen (Tylenol), Serum <10 (L) 10 - 30 ug/mL    Comment:        THERAPEUTIC CONCENTRATIONS VARY SIGNIFICANTLY. A RANGE OF 10-30 ug/mL MAY BE AN EFFECTIVE CONCENTRATION FOR MANY PATIENTS. HOWEVER, SOME ARE BEST TREATED AT CONCENTRATIONS OUTSIDE THIS RANGE. ACETAMINOPHEN CONCENTRATIONS >150 ug/mL AT 4 HOURS AFTER INGESTION AND >50 ug/mL AT 12 HOURS AFTER INGESTION ARE OFTEN ASSOCIATED WITH TOXIC REACTIONS.   Comprehensive metabolic panel     Status: Abnormal   Collection Time: 02/05/15  8:49 PM  Result Value Ref Range   Sodium 136 135 - 145 mmol/L   Potassium 2.5 (LL) 3.5 - 5.1 mmol/L    Comment: CRITICAL RESULT CALLED TO, READ BACK BY AND VERIFIED WITH: LOWDERMILK [facility] 02/05/15 2126 WAYK    Chloride 104 101 - 111 mmol/L   CO2 17 (L) 22 - 32 mmol/L   Glucose, Bld 140 (H) 65 - 99 mg/dL   BUN 8 6 - 20 mg/dL   Creatinine, Ser 0.82 0.61 - 1.24 mg/dL   Calcium 9.5 8.9 - 10.3 mg/dL   Total Protein 7.0 6.5 - 8.1 g/dL   Albumin 4.2 3.5 - 5.0 g/dL   AST 31 15 - 41 U/L   ALT 21 17 - 63 U/L   Alkaline Phosphatase 118 38 - 126 U/L   Total Bilirubin 0.3 0.3 - 1.2 mg/dL   GFR calc non Af Amer >  60 >60 mL/min   GFR calc Af Amer >60 >60 mL/min    Comment: (NOTE) The eGFR has been calculated using the CKD EPI equation. This calculation has not been validated in all clinical situations. eGFR's persistently <60 mL/min signify possible Chronic Kidney Disease.    Anion gap 15 5 - 15  CBC     Status: Abnormal   Collection Time: 02/05/15  8:49 PM  Result Value Ref Range    WBC 10.6 (H) 4.0 - 10.5 K/uL   RBC 5.73 4.22 - 5.81 MIL/uL   Hemoglobin 15.7 13.0 - 17.0 g/dL   HCT 46.1 39.0 - 52.0 %   MCV 80.5 78.0 - 100.0 fL   MCH 27.4 26.0 - 34.0 pg   MCHC 34.1 30.0 - 36.0 g/dL   RDW 13.6 11.5 - 15.5 %   Platelets 361 150 - 400 K/uL  Magnesium     Status: None   Collection Time: 02/05/15  8:49 PM  Result Value Ref Range   Magnesium 2.3 1.7 - 2.4 mg/dL  I-Stat CG4 Lactic Acid, ED     Status: Abnormal   Collection Time: 02/05/15 11:00 PM  Result Value Ref Range   Lactic Acid, Venous 2.40 (HH) 0.5 - 2.0 mmol/L   Comment NOTIFIED PHYSICIAN   I-Stat venous blood gas, ED     Status: Abnormal   Collection Time: 02/05/15 11:00 PM  Result Value Ref Range   pH, Ven 7.411 (H) 7.250 - 7.300   pCO2, Ven 38.1 (L) 45.0 - 50.0 mmHg   pO2, Ven 47.0 (H) 30.0 - 45.0 mmHg   Bicarbonate 24.2 (H) 20.0 - 24.0 mEq/L   TCO2 25 0 - 100 mmol/L   O2 Saturation 83.0 %   Patient temperature HIDE    Sample type VENOUS   Basic metabolic panel     Status: Abnormal   Collection Time: 02/06/15 10:37 AM  Result Value Ref Range   Sodium 140 135 - 145 mmol/L   Potassium 4.4 3.5 - 5.1 mmol/L   Chloride 108 101 - 111 mmol/L   CO2 24 22 - 32 mmol/L   Glucose, Bld 110 (H) 65 - 99 mg/dL   BUN 7 6 - 20 mg/dL   Creatinine, Ser 0.81 0.61 - 1.24 mg/dL   Calcium 9.4 8.9 - 10.3 mg/dL   GFR calc non Af Amer >60 >60 mL/min   GFR calc Af Amer >60 >60 mL/min    Comment: (NOTE) The eGFR has been calculated using the CKD EPI equation. This calculation has not been validated in all clinical situations. eGFR's persistently <60 mL/min signify possible Chronic Kidney Disease.    Anion gap 8 5 - 15  I-Stat CG4 Lactic Acid, ED     Status: None   Collection Time: 02/06/15 11:30 AM  Result Value Ref Range   Lactic Acid, Venous 1.16 0.5 - 2.0 mmol/L    Vitals: Blood pressure 146/95, pulse 79, temperature 97.6 F (36.4 C), temperature source Oral, resp. rate 18, SpO2 96 %.  Risk to Self: Suicidal  Ideation: No-Not Currently/Within Last 6 Months (earlier today, not currently per pt) Suicidal Intent: No (denies, sts "want to but can't") Is patient at risk for suicide?: No Suicidal Plan?: No (denies) Access to Means: No (denies access to firearms) What has been your use of drugs/alcohol within the last 12 months?: regular use How many times?: 0 Other Self Harm Risks:  (binges drinking liquor; drinks 1/2 gal. in < 1 hour) Triggers for Past Attempts: Unpredictable,  Anniversary (sts wedding anniversary in the winter plus holidays) Intentional Self Injurious Behavior: None Risk to Others: Homicidal Ideation: No (denies) Thoughts of Harm to Others: Yes-Currently Present (sts he thinks he might harm anyone who "gets in his face") Comment - Thoughts of Harm to Others: sts pain causes irritability & thoughts of possibly harming others Current Homicidal Intent: No (denies) Current Homicidal Plan: No (denies) Access to Homicidal Means: No (denies) Identified Victim: na History of harm to others?: No (denies) Assessment of Violence: None Noted Does patient have access to weapons?: No (denies) Criminal Charges Pending?: No (denies) Does patient have a court date: No Prior Inpatient Therapy: Prior Inpatient Therapy: Yes Prior Therapy Dates: 01/2014 Prior Therapy Facilty/Provider(s): [facility] Reason for Treatment: SI Prior Outpatient Therapy: Prior Outpatient Therapy: Yes Prior Therapy Dates: for short periods recently- a few visits Prior Therapy Facilty/Provider(s): [facility] and others Reason for Treatment: SI, Anxiety, Bipolar (Pt sts that he has been diagnosed Bipolar) Does patient have an ACCT team?: No Does patient have Intensive In-House Services?  : No Does patient have [facility] services? : No (not currently,but formerly) Does patient have [facility] services?: No  Current Facility-Administered Medications  Medication Dose Route Frequency Provider Last Rate Last Dose  . albuterol (PROVENTIL  HFA;VENTOLIN HFA) 108 (90 BASE) MCG/ACT inhaler 2 puff  2 puff Inhalation Q4H PRN Daleen Bo, MD      . gabapentin (NEURONTIN) capsule 800 mg  800 mg Oral TID Daleen Bo, MD   800 mg at 02/07/15 1032  . LORazepam (ATIVAN) injection 0-4 mg  0-4 mg Intravenous 4 times per day Nathaniel Man, MD   1 mg at 02/07/15 0705   Followed by  . [START ON 02/08/2015] LORazepam (ATIVAN) injection 0-4 mg  0-4 mg Intravenous Q12H Nathaniel Man, MD      . LORazepam (ATIVAN) tablet 0-4 mg  0-4 mg Oral 4 times per day Ezequiel Essex, MD   0 mg at 02/07/15 1221   Followed by  . [START ON 02/09/2015] LORazepam (ATIVAN) tablet 0-4 mg  0-4 mg Oral Q12H Ezequiel Essex, MD      . metoprolol tartrate (LOPRESSOR) tablet 50 mg  50 mg Oral BID Daleen Bo, MD   50 mg at 02/07/15 1032  . oxyCODONE (Oxy IR/ROXICODONE) immediate release tablet 5 mg  5 mg Oral Q4H PRN Daleen Bo, MD   5 mg at 02/07/15 1100  . QUEtiapine (SEROQUEL) tablet 75 mg  75 mg Oral QHS Daleen Bo, MD   75 mg at 02/06/15 2218  . tamsulosin (FLOMAX) capsule 0.8 mg  0.8 mg Oral QPC supper Daleen Bo, MD   0.8 mg at 02/06/15 1846   Current Outpatient Prescriptions  Medication Sig Dispense Refill  . albuterol (PROVENTIL HFA;VENTOLIN HFA) 108 (90 BASE) MCG/ACT inhaler Inhale 2 puffs into the lungs every 4 (four) hours as needed for wheezing or shortness of breath. 6.7 g 2  . gabapentin (NEURONTIN) 400 MG capsule Take 2 capsules (800 mg total) by mouth 3 (three) times daily. For substance withdrawal syndrome/paina management 180 capsule 5  . lansoprazole (PREVACID) 15 MG capsule Take 15 mg by mouth daily as needed (stomach upset).     . metoprolol (LOPRESSOR) 50 MG tablet Take 1 tablet (50 mg total) by mouth 2 (two) times daily. 60 tablet 11  . QUEtiapine (SEROQUEL) 100 MG tablet Take 75 mg by mouth at bedtime. 3/4 tablet - one hour before bedtime  3  . acetaminophen-codeine (TYLENOL #3) 300-30 MG per tablet Take  1 tablet by mouth 2 (two) times  daily as needed for moderate pain. (Patient not taking: Reported on 12/19/2014) 60 tablet 1  . cyclobenzaprine (FLEXERIL) 10 MG tablet Take 1 tablet (10 mg total) by mouth 2 (two) times daily as needed for muscle spasms. (Patient not taking: Reported on 02/05/2015) 60 tablet 0  . oxyCODONE (OXY IR/ROXICODONE) 5 MG immediate release tablet Take 10 mg by mouth every 3 (three) hours as needed (pain).   0  . oxyCODONE-acetaminophen (PERCOCET) 5-325 MG per tablet Take 1-2 tablets by mouth every 4 (four) hours as needed for severe pain. (Patient not taking: Reported on 02/05/2015) 90 tablet 0  . tamsulosin (FLOMAX) 0.4 MG CAPS capsule Take 2 capsules (0.8 mg total) by mouth daily after supper. (Patient not taking: Reported on 02/05/2015) 60 capsule 3    Musculoskeletal: Strength & Muscle Tone: within normal limits Gait & Station: normal Patient leans: N/A  Psychiatric Specialty Exam: Physical Exam  Review of Systems  Constitutional: Negative.   HENT: Negative.   Eyes: Negative.   Respiratory: Negative.   Cardiovascular: Negative.   Gastrointestinal: Negative.   Genitourinary: Negative.   Musculoskeletal: Positive for joint pain.  Skin: Negative.   Neurological: Negative.   Endo/Heme/Allergies: Negative.   Psychiatric/Behavioral: Negative for depression, suicidal ideas, hallucinations, memory loss and substance abuse. The patient is not nervous/anxious and does not have insomnia.     Blood pressure 146/95, pulse 79, temperature 97.6 F (36.4 C), temperature source Oral, resp. rate 18, SpO2 96 %.There is no weight on file to calculate BMI.  General Appearance: Casual  Eye Contact::  Good  Speech:  Clear and Coherent  Volume:  Normal  Mood:  Anxious  Affect:  Appropriate  Thought Process:  Goal Directed and Intact  Orientation:  Full (Time, Place, and Person)  Thought Content:  WDL  Suicidal Thoughts:  No  Homicidal Thoughts:  No  Memory:  Immediate;   Good Recent;   Good Remote;    Good  Judgement:  Fair  Insight:  Present  Psychomotor Activity:  Normal  Concentration:  Good  Recall:  Good  Fund of Knowledge:Good  Language: Good  Akathisia:  No  Handed:  Right  AIMS (if indicated):     Assets:  Communication Skills Desire for Improvement Housing Leisure Time Physical Health Resilience Social Support  ADL's:  Intact  Cognition: WNL  Sleep:      Medical Decision Making: Established Problem, Stable/Improving (1), Review of Psycho-Social Stressors (1), Review or order clinical lab tests (1) and Review of Medication Regimen & Side Effects (2)   Treatment Plan Summary: Patient does not appear to be at risk to himself. Encouraged him to call his orthopedic surgeon prior to discharge from the ED to verify that he will be able to get his prescription for oxycodone filled as this appears to be the trigger for his previous suicidal statements.   Plan:  No evidence of imminent risk to self or others at present.   Patient does not meet criteria for psychiatric inpatient admission. Supportive therapy provided about ongoing stressors. Discussed crisis plan, support from social network, calling 911, coming to the Emergency Department, and calling Suicide Hotline. Disposition: Discharge to home to follow up with orthopedic surgeon for refills of oxycodone  Elmarie Shiley, NP-C 02/07/2015 12:55 PM

## 2015-02-07 NOTE — ED Notes (Signed)
Pt given Percocet as requested for pain. Pt asking for 2 Percocet - states this is what he takes at home. Pt asking when he can leave. Advised pt after his Telepsych is completed and his treatment team are in agreement w/him being d/c'd, he may follow up w/his ortho dr to get pain med. Voiced understanding and states he is ready to be d/c'd.

## 2015-02-13 ENCOUNTER — Ambulatory Visit: Payer: No Typology Code available for payment source | Attending: Family Medicine | Admitting: Family Medicine

## 2015-02-13 ENCOUNTER — Encounter: Payer: Self-pay | Admitting: Family Medicine

## 2015-02-13 VITALS — BP 123/75 | HR 70 | Temp 98.5°F | Resp 16 | Ht 76.0 in | Wt 223.0 lb

## 2015-02-13 DIAGNOSIS — I10 Essential (primary) hypertension: Secondary | ICD-10-CM | POA: Insufficient documentation

## 2015-02-13 DIAGNOSIS — M545 Low back pain, unspecified: Secondary | ICD-10-CM

## 2015-02-13 DIAGNOSIS — G8929 Other chronic pain: Secondary | ICD-10-CM

## 2015-02-13 DIAGNOSIS — G894 Chronic pain syndrome: Secondary | ICD-10-CM | POA: Insufficient documentation

## 2015-02-13 DIAGNOSIS — F17210 Nicotine dependence, cigarettes, uncomplicated: Secondary | ICD-10-CM | POA: Insufficient documentation

## 2015-02-13 DIAGNOSIS — M25511 Pain in right shoulder: Secondary | ICD-10-CM | POA: Insufficient documentation

## 2015-02-13 DIAGNOSIS — M542 Cervicalgia: Secondary | ICD-10-CM | POA: Insufficient documentation

## 2015-02-13 DIAGNOSIS — G629 Polyneuropathy, unspecified: Secondary | ICD-10-CM

## 2015-02-13 DIAGNOSIS — Z79899 Other long term (current) drug therapy: Secondary | ICD-10-CM | POA: Insufficient documentation

## 2015-02-13 DIAGNOSIS — Z720 Tobacco use: Secondary | ICD-10-CM | POA: Insufficient documentation

## 2015-02-13 MED ORDER — GABAPENTIN 400 MG PO CAPS: 800.0000 mg | ORAL_CAPSULE | Freq: Three times a day (TID) | ORAL | Status: DC

## 2015-02-13 MED ORDER — ACETAMINOPHEN-CODEINE #3 300-30 MG PO TABS: 2.0000 | ORAL_TABLET | Freq: Four times a day (QID) | ORAL | Status: DC | PRN

## 2015-02-13 MED ORDER — METOPROLOL TARTRATE 50 MG PO TABS
50.0000 mg | ORAL_TABLET | Freq: Two times a day (BID) | ORAL | Status: DC
Start: 2015-02-13 — End: 2015-07-24

## 2015-02-13 NOTE — Progress Notes (Signed)
Patient ID: Mark [name], male   DOB: 07-30-1965, 49 y.o.   MRN: [id number]   Subjective:  Patient ID: Mark [name], male    DOB: [birth year]  Age: 49 y.o. MRN: [id number]  CC: Generalized Body Aches   HPI Mark [name] presents for   1. Chronic pain: R shoulder, low back, feet. Percocet helped when he had it. Had a fall last month and injured his R shoulder again. Request a new pain management referral. Requesting tylenol #3 refill. Tramadol did not help with pain.   Social History  Substance Use Topics  . Smoking status: Current Every Day Smoker -- 0.50 packs/day for 30 years    Types: Cigarettes  . Smokeless tobacco: Never Used     Comment: 11-30-2014 info given  . Alcohol Use: No    Outpatient Prescriptions Prior to Visit  Medication Sig Dispense Refill  . acetaminophen-codeine (TYLENOL #3) 300-30 MG per tablet Take 1 tablet by mouth 2 (two) times daily as needed for moderate pain. 60 tablet 1  . albuterol (PROVENTIL HFA;VENTOLIN HFA) 108 (90 BASE) MCG/ACT inhaler Inhale 2 puffs into the lungs every 4 (four) hours as needed for wheezing or shortness of breath. 6.7 g 2  . cyclobenzaprine (FLEXERIL) 10 MG tablet Take 1 tablet (10 mg total) by mouth 2 (two) times daily as needed for muscle spasms. 60 tablet 0  . gabapentin (NEURONTIN) 400 MG capsule Take 2 capsules (800 mg total) by mouth 3 (three) times daily. For substance withdrawal syndrome/paina management 180 capsule 5  . lansoprazole (PREVACID) 15 MG capsule Take 15 mg by mouth daily as needed (stomach upset).     . metoprolol (LOPRESSOR) 50 MG tablet Take 1 tablet (50 mg total) by mouth 2 (two) times daily. 60 tablet 11  . QUEtiapine (SEROQUEL) 100 MG tablet Take 75 mg by mouth at bedtime. 3/4 tablet - one hour before bedtime  3  . oxyCODONE (OXY IR/ROXICODONE) 5 MG immediate release tablet Take 10 mg by mouth every 3 (three) hours as needed (pain).   0  . oxyCODONE-acetaminophen (PERCOCET) 5-325 MG per tablet Take 1-2  tablets by mouth every 4 (four) hours as needed for severe pain. (Patient not taking: Reported on 02/05/2015) 90 tablet 0  . tamsulosin (FLOMAX) 0.4 MG CAPS capsule Take 2 capsules (0.8 mg total) by mouth daily after supper. (Patient not taking: Reported on 02/13/2015) 60 capsule 3   No facility-administered medications prior to visit.    ROS Review of Systems  Constitutional: Negative for fever, chills, fatigue and unexpected weight change.  Eyes: Negative for visual disturbance.  Respiratory: Negative for cough and shortness of breath.   Cardiovascular: Negative for chest pain, palpitations and leg swelling.  Gastrointestinal: Negative for nausea, vomiting, abdominal pain, diarrhea, constipation and blood in stool.  Endocrine: Negative for polydipsia, polyphagia and polyuria.  Musculoskeletal: Positive for back pain and arthralgias. Negative for myalgias, gait problem and neck pain.  Skin: Negative for rash.  Allergic/Immunologic: Negative for immunocompromised state.  Neurological: Positive for numbness.  Hematological: Negative for adenopathy. Does not bruise/bleed easily.  Psychiatric/Behavioral: Negative for suicidal ideas, sleep disturbance and dysphoric mood. The patient is not nervous/anxious.     Objective:  BP 123/75 mmHg  Pulse 70  Temp(Src) 98.5 F (36.9 C) (Oral)  Resp 16  Ht 6\' 4"  (1.93 m)  Wt 223 lb (101.152 kg)  BMI 27.16 kg/m2  SpO2 98%  BP/Weight 02/13/2015 02/07/2015 [id number]  Systolic BP [id number]  Diastolic BP  75 93 89  Wt. (Lbs) 223 - 225  BMI 27.16 - 27.4  Some encounter information is confidential and restricted. Go to Review Flowsheets activity to see all data.     Physical Exam  Constitutional: He appears well-developed and well-nourished. No distress.  HENT:  Head: Normocephalic and atraumatic.  Neck: Normal range of motion. Neck supple.  Cardiovascular: Normal rate, regular rhythm, normal heart sounds and intact distal pulses.   Pulses:       Dorsalis pedis pulses are 2+ on the right side, and 2+ on the left side.       Posterior tibial pulses are 2+ on the right side, and 2+ on the left side.  Pulmonary/Chest: Effort normal and breath sounds normal.  Musculoskeletal: He exhibits no edema.       Right shoulder: He exhibits decreased range of motion, tenderness and pain.  Neurological: He is alert.  Skin: Skin is warm and dry. No rash noted. No erythema.  Psychiatric: He has a normal mood and affect.    Assessment & Plan:   Problem List Items Addressed This Visit    Chronic low back pain (Chronic)   Relevant Medications   acetaminophen-codeine (TYLENOL #3) 300-30 MG tablet   Other Relevant Orders   Ambulatory referral to Pain Clinic   Chronic pain syndrome - Primary (Chronic)    A:chronic R shoulder, back, neck pain with foot neuropathy P: Refilled tylenol #3 Pain management referral        Relevant Medications   acetaminophen-codeine (TYLENOL #3) 300-30 MG tablet   Other Relevant Orders   Ambulatory referral to Pain Clinic   Hypertension (Chronic)   Relevant Medications   metoprolol (LOPRESSOR) 50 MG tablet   Neuropathy ([name]) (Chronic)   Relevant Medications   gabapentin (NEURONTIN) 400 MG capsule   Pain in joint, shoulder region (Chronic)   Relevant Medications   acetaminophen-codeine (TYLENOL #3) 300-30 MG tablet   Other Relevant Orders   Ambulatory referral to Pain Clinic   Tobacco abuse (Chronic)     No orders of the defined types were placed in this encounter.    Follow-up: No Follow-up on file.   Boykin Nearing MD

## 2015-02-13 NOTE — Progress Notes (Signed)
C/C Chronic  body ache. Referral pain management  Pain scale # 8-9 Tobacco user 1/2 ppday  No suicidal thought in the past two weeks

## 2015-02-13 NOTE — Patient Instructions (Addendum)
Mark [name] was seen today for generalized body aches.  Diagnoses and all orders for this visit:  Chronic pain syndrome -     Ambulatory referral to Pain Clinic -     acetaminophen-codeine (TYLENOL #3) 300-30 MG tablet; Take 2 tablets by mouth every 6 (six) hours as needed for moderate pain.  Chronic low back pain -     Ambulatory referral to Pain Clinic -     acetaminophen-codeine (TYLENOL #3) 300-30 MG tablet; Take 2 tablets by mouth every 6 (six) hours as needed for moderate pain.  Pain in joint of right shoulder -     Ambulatory referral to Pain Clinic -     acetaminophen-codeine (TYLENOL #3) 300-30 MG tablet; Take 2 tablets by mouth every 6 (six) hours as needed for moderate pain.  Tobacco abuse   Smoking cessation support: smoking cessation hotline: 1-800-QUIT-NOW.  Smoking cessation classes are available through [facility]. Call [phone] or visit our website at [email or web address]  F/u in 2 months for chronic pain, call when you're ready for tylenol #3 refill   Dr. Adrian Blackwater

## 2015-02-13 NOTE — Assessment & Plan Note (Signed)
A:chronic R shoulder, back, neck pain with foot neuropathy P: Refilled tylenol #3 Pain management referral

## 2015-02-16 ENCOUNTER — Other Ambulatory Visit: Payer: Self-pay | Admitting: Internal Medicine

## 2015-02-20 NOTE — Telephone Encounter (Signed)
Patient called and requested a med refill for QUEtiapine (SEROQUEL) 100 MG tablet. Please f/u with patient.

## 2015-02-22 NOTE — Telephone Encounter (Signed)
Please call patient to ask who last prescribed seroquel and when he last dose was. This a mood stabilizer/sleep aid that is best monitored by a psychiatrist.

## 2015-02-27 ENCOUNTER — Emergency Department (HOSPITAL_COMMUNITY): Payer: No Typology Code available for payment source

## 2015-02-27 ENCOUNTER — Emergency Department (HOSPITAL_COMMUNITY)
Admission: EM | Admit: 2015-02-27 | Discharge: 2015-02-27 | Disposition: A | Discharge status: Home / Self Care | Payer: No Typology Code available for payment source | Attending: Emergency Medicine | Admitting: Emergency Medicine

## 2015-02-27 ENCOUNTER — Telehealth: Payer: Self-pay | Admitting: Gastroenterology

## 2015-02-27 ENCOUNTER — Encounter (HOSPITAL_COMMUNITY): Payer: Self-pay | Admitting: Family Medicine

## 2015-02-27 DIAGNOSIS — I10 Essential (primary) hypertension: Secondary | ICD-10-CM | POA: Insufficient documentation

## 2015-02-27 DIAGNOSIS — Z792 Long term (current) use of antibiotics: Secondary | ICD-10-CM | POA: Insufficient documentation

## 2015-02-27 DIAGNOSIS — J441 Chronic obstructive pulmonary disease with (acute) exacerbation: Secondary | ICD-10-CM | POA: Insufficient documentation

## 2015-02-27 DIAGNOSIS — F17210 Nicotine dependence, cigarettes, uncomplicated: Secondary | ICD-10-CM | POA: Insufficient documentation

## 2015-02-27 DIAGNOSIS — R1011 Right upper quadrant pain: Secondary | ICD-10-CM

## 2015-02-27 DIAGNOSIS — R110 Nausea: Secondary | ICD-10-CM | POA: Insufficient documentation

## 2015-02-27 DIAGNOSIS — K219 Gastro-esophageal reflux disease without esophagitis: Secondary | ICD-10-CM | POA: Insufficient documentation

## 2015-02-27 DIAGNOSIS — G629 Polyneuropathy, unspecified: Secondary | ICD-10-CM | POA: Insufficient documentation

## 2015-02-27 DIAGNOSIS — Z8639 Personal history of other endocrine, nutritional and metabolic disease: Secondary | ICD-10-CM | POA: Insufficient documentation

## 2015-02-27 DIAGNOSIS — R9431 Abnormal electrocardiogram [ECG] [EKG]: Secondary | ICD-10-CM

## 2015-02-27 DIAGNOSIS — Z79899 Other long term (current) drug therapy: Secondary | ICD-10-CM | POA: Insufficient documentation

## 2015-02-27 DIAGNOSIS — F419 Anxiety disorder, unspecified: Secondary | ICD-10-CM | POA: Insufficient documentation

## 2015-02-27 DIAGNOSIS — Z9889 Other specified postprocedural states: Secondary | ICD-10-CM | POA: Insufficient documentation

## 2015-02-27 DIAGNOSIS — I4581 Long QT syndrome: Secondary | ICD-10-CM | POA: Insufficient documentation

## 2015-02-27 DIAGNOSIS — R002 Palpitations: Secondary | ICD-10-CM | POA: Insufficient documentation

## 2015-02-27 DIAGNOSIS — R1013 Epigastric pain: Secondary | ICD-10-CM | POA: Insufficient documentation

## 2015-02-27 LAB — COMPREHENSIVE METABOLIC PANEL
ALBUMIN: 4.2 g/dL (ref 3.5–5.0)
ALK PHOS: 90 U/L (ref 38–126)
ALT: 22 U/L (ref 17–63)
ANION GAP: 10 (ref 5–15)
AST: 21 U/L (ref 15–41)
BUN: 12 mg/dL (ref 6–20)
CALCIUM: 9.3 mg/dL (ref 8.9–10.3)
CO2: 25 mmol/L (ref 22–32)
Chloride: 102 mmol/L (ref 101–111)
Creatinine, Ser: 0.99 mg/dL (ref 0.61–1.24)
GFR calc non Af Amer: >60 mL/min (ref >60)
GLUCOSE: 138 mg/dL — AB (ref 65–99)
POTASSIUM: 3.5 mmol/L (ref 3.5–5.1)
SODIUM: 137 mmol/L (ref 135–145)
TOTAL PROTEIN: 6.4 g/dL — AB (ref 6.5–8.1)
Total Bilirubin: 0.6 mg/dL (ref 0.3–1.2)

## 2015-02-27 LAB — CBC
HEMATOCRIT: 43.8 % (ref 39.0–52.0)
HEMOGLOBIN: 14.5 g/dL (ref 13.0–17.0)
MCH: 26.8 pg (ref 26.0–34.0)
MCHC: 33.1 g/dL (ref 30.0–36.0)
MCV: 81 fL (ref 78.0–100.0)
Platelets: 257 10*3/uL (ref 150–400)
RBC: 5.41 MIL/uL (ref 4.22–5.81)
RDW: 14.3 % (ref 11.5–15.5)
WBC: 8.3 10*3/uL (ref 4.0–10.5)

## 2015-02-27 LAB — URINALYSIS, ROUTINE W REFLEX MICROSCOPIC
Bilirubin Urine: NEGATIVE
Glucose, UA: NEGATIVE mg/dL
Hgb urine dipstick: NEGATIVE
Ketones, ur: NEGATIVE mg/dL
Leukocytes, UA: NEGATIVE
NITRITE: NEGATIVE
PH: 7.5 (ref 5.0–8.0)
Protein, ur: NEGATIVE mg/dL
SPECIFIC GRAVITY, URINE: 1.01 (ref 1.005–1.030)

## 2015-02-27 LAB — TROPONIN I

## 2015-02-27 LAB — LIPASE, BLOOD: Lipase: 51 U/L (ref 11–51)

## 2015-02-27 LAB — BRAIN NATRIURETIC PEPTIDE: B NATRIURETIC PEPTIDE 5: 23.9 pg/mL (ref 0.0–100.0)

## 2015-02-27 MED ORDER — DICYCLOMINE HCL 10 MG PO CAPS: 20.0000 mg | ORAL_CAPSULE | Freq: Four times a day (QID) | ORAL | Status: DC | PRN

## 2015-02-27 MED ORDER — MORPHINE SULFATE (PF) 4 MG/ML IV SOLN
4.0000 mg | Freq: Once | INTRAVENOUS | Status: AC
  Administered 2015-02-27: 4 mg via INTRAVENOUS
  Filled 2015-02-27: qty 1

## 2015-02-27 MED ORDER — DICYCLOMINE HCL 10 MG PO CAPS
10.0000 mg | ORAL_CAPSULE | Freq: Once | ORAL | Status: AC
  Administered 2015-02-27: 10 mg via ORAL
  Filled 2015-02-27: qty 1

## 2015-02-27 MED ORDER — FAMOTIDINE IN NACL 20-0.9 MG/50ML-% IV SOLN
20.0000 mg | Freq: Once | INTRAVENOUS | Status: AC
  Administered 2015-02-27: 20 mg via INTRAVENOUS
  Filled 2015-02-27: qty 50

## 2015-02-27 NOTE — Telephone Encounter (Signed)
Pt. Called requesting a med refill on  QUEtiapine (SEROQUEL) 100 MG tablet. Please f/u with pt.

## 2015-02-27 NOTE — Discharge Instructions (Signed)
Continue to take your metoprolol 50 mg twice a day additionally, take 25 mg at night. Set up an appointment with Dr. Terrence Dupont in the next 2 days.  Please follow with your primary care doctor in the next 2 days for a check-up. They must obtain records for further management.   Do not hesitate to return to the Emergency Department for any new, worsening or concerning symptoms.

## 2015-02-27 NOTE — ED Provider Notes (Signed)
CSN: [id number]     Arrival date & time 02/27/15  1045 History   First MD Initiated Contact with Patient 02/27/15 1059     Chief Complaint  Patient presents with  . Abdominal Pain     (Consider location/radiation/quality/duration/timing/severity/associated sxs/prior Treatment) HPI  Blood pressure 126/65, pulse 72, temperature 97.8 F (36.6 C), temperature source Oral, resp. rate 18, SpO2 100 %.  Mark [name] is a 49 y.o. male with past medical history significant for COPD, former opioid abuse (former methadone user ), former alcohol abuse, prolonged QT syndrome and GERD with multiple complaints. Patient states that he has severe, 9 out of 10 epigastric and right upper quadrant pain worsening over the course of several months. Has had previous fundoplication and has not vomited nauseous. Denies any current alcohol use, excessive NSAID use. Patient states he's been taking acetaminophen for pain with little relief. Associated nonbloody, non-melanotic diarrhea. Patient also reports frequent PVCs, states that he was advised that he would likely need a pacemaker at some point due to prolonged QT. States that the presyncopal sensation is worsening significantly over the last 2 weeks. Patient also reports the dyspnea on exertion, he has to stop to catch his breath after he climbs a flight of steps, this is worsening over the course of the last month. He denies increasing peripheral edema, orthopnea, PND. Reports productive cough, he is an active daily smoker but states that he "doesn't smoke much." Patient denies fever, chills, calf pain. States that he has refused by mouth intake secondary to nausea. Reports presyncopal sensation intermittently.   Cardiology: Hermenia Fiscal [facility]  Past Medical History  Diagnosis Date  . 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 ETOH abuse   . Mental disorder   . Irregular heart beat   . Shortness of breath   .  Headache(784.0)   . Hepatitis   . Alcohol withdrawal ([place])   . Neuropathy ([place]) 06/07/2013  . 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 Seizure due to alcohol withdrawal ([place]) 01/23/2014    Per patient report  . Anxiety Dx 2014  . GERD (gastroesophageal reflux disease) Dx 2003  . Hyperlipidemia Dx 2000  . Hypertension Dx 2011  . Withdrawal seizures ([place]) 2014  . COPD (chronic obstructive pulmonary disease) [facility])    Past Surgical History  Procedure Laterality Date  . Back surgery    . Neck surgery    . Fundoplasty transthoracic  2003  . Nasal sinus surgery    . Left heart catheterization with coronary angiogram N/A 01/13/2014    Procedure: LEFT HEART CATHETERIZATION WITH CORONARY ANGIOGRAM;  Surgeon: Clent Demark, MD;  Location: [facility] CATH LAB;  Service: Cardiovascular;  Laterality: N/A;  . Subacromial decompression Right 11/16/2014    Procedure: RIGHT SHOULDER ARTHROSCOPY WITH SUBACROMIAL DECOMPRESSION ;  Surgeon: Leandrew Koyanagi, MD;  Location: [facility];  Service: Orthopedics;  Laterality: Right;  . Nissen fundoplication    . 24 hour ph study N/A 01/16/2015    Procedure: 24 HOUR PH STUDY;  Surgeon: Manus Gunning, MD;  Location: [facility] ENDOSCOPY;  Service: Gastroenterology;  Laterality: N/A;   Family History  Problem Relation Age of Onset  . Breast cancer Paternal Grandmother   . Lung cancer Father     was a smoker  . Hypertension Mother   . Colon polyps Mother    Social History  Substance Use Topics  . Smoking status: Current Every  Day Smoker -- 0.50 packs/day for 30 years    Types: Cigarettes  . Smokeless tobacco: Never Used     Comment: 11-30-2014 info given  . Alcohol Use: 0.0 oz/week    0 Standard drinks or equivalent per week     Comment: occ    Review of Systems  10 systems reviewed and found to be negative, except as noted in the HPI.   Allergies  Lisinopril and Cozaar  Home Medications   Prior to Admission medications   Medication Sig Start Date  End Date Taking? Authorizing Provider  gabapentin (NEURONTIN) 400 MG capsule Take 2 capsules (800 mg total) by mouth 3 (three) times daily. For substance withdrawal syndrome/paina management 02/13/15  Yes Boykin Nearing, MD  lansoprazole (PREVACID) 15 MG capsule Take 15 mg by mouth 2 (two) times daily before a meal.    Yes Historical Provider, MD  metoprolol (LOPRESSOR) 50 MG tablet Take 1 tablet (50 mg total) by mouth 2 (two) times daily. 02/13/15  Yes Josalyn Funches, MD  QUEtiapine (SEROQUEL) 100 MG tablet Take 75 mg by mouth at bedtime. 3/4 tablet - one hour before bedtime 11/17/14  Yes Historical Provider, MD  sulfamethoxazole-trimethoprim (BACTRIM DS,SEPTRA DS) 800-160 MG tablet Take 1 tablet by mouth 2 (two) times daily. 02/20/15  Yes Historical Provider, MD  tamsulosin (FLOMAX) 0.4 MG CAPS capsule Take 0.8 mg by mouth daily after supper.   Yes Historical Provider, MD  acetaminophen-codeine (TYLENOL #3) 300-30 MG tablet Take 2 tablets by mouth every 6 (six) hours as needed for moderate pain. Patient not taking: Reported on 02/27/2015 02/13/15   Boykin Nearing, MD  albuterol (PROVENTIL HFA;VENTOLIN HFA) 108 (90 BASE) MCG/ACT inhaler Inhale 2 puffs into the lungs every 4 (four) hours as needed for wheezing or shortness of breath. 07/28/14   Josalyn Funches, MD  cyclobenzaprine (FLEXERIL) 10 MG tablet Take 1 tablet (10 mg total) by mouth 2 (two) times daily as needed for muscle spasms. Patient not taking: Reported on 02/27/2015 06/22/14   Gerda Diss, MD  dicyclomine (BENTYL) 10 MG capsule Take 2 capsules (20 mg total) by mouth 4 (four) times daily as needed for spasms. 02/27/15   Seena Face, PA-C   BP 136/83 mmHg  Pulse 62  Temp(Src) 97.8 F (36.6 C) (Oral)  Resp 18  SpO2 96% Physical Exam  Constitutional: He is oriented to person, place, and time. He appears well-developed and well-nourished. No distress.  HENT:  Head: Normocephalic.  Mouth/Throat: Oropharynx is clear and moist.   Eyes: Conjunctivae and EOM are normal.  Neck: Normal range of motion.  Cardiovascular: Normal rate.   Irregular rhythm  Pulmonary/Chest: Effort normal. No stridor. No respiratory distress. He has wheezes. He has no rales. He exhibits no tenderness.  Reduced air movement in all fields, trace scattered expiratory wheezing  Abdominal: Soft. Bowel sounds are normal. He exhibits no distension and no mass. There is tenderness. There is no rebound and no guarding.  Musculoskeletal: Normal range of motion.  Neurological: He is alert and oriented to person, place, and time.  Psychiatric: He has a normal mood and affect.  Nursing note and vitals reviewed.   ED Course  Procedures (including critical care time) Labs Review Labs Reviewed  COMPREHENSIVE METABOLIC PANEL - Abnormal; Notable for the following:    Glucose, Bld 138 (*)    Total Protein 6.4 (*)    All other components within normal limits  LIPASE, BLOOD  CBC  URINALYSIS, ROUTINE W REFLEX MICROSCOPIC (NOT AT [facility])  BRAIN NATRIURETIC PEPTIDE  TROPONIN I    Imaging Review Dg Chest 2 View  02/27/2015  CLINICAL DATA:  Palpitations and abdominal pain. EXAM: CHEST  2 VIEW COMPARISON:  07/22/2014 and 04/12/2014. FINDINGS: Lungs are adequately inflated without consolidation, effusion or pneumothorax. Cardiomediastinal silhouette is within normal. There are mild degenerate changes of the spine. Fusion hardware is present over the cervical thoracic junction as there is continued evidence of fracture of the 2 most superior screws. IMPRESSION: No active cardiopulmonary disease. Electronically Signed   By: Marin Olp M.D.   On: 02/27/2015 12:20   US Abdomen Limited Ruq  02/27/2015  CLINICAL DATA:  Right upper quadrant pain and nausea for 2 weeks. Initial encounter. EXAM: US ABDOMEN LIMITED - RIGHT UPPER QUADRANT COMPARISON:  CT abdomen and pelvis 04/12/2014. FINDINGS: Gallbladder: No gallstones or wall thickening visualized. No sonographic  Murphy sign noted by sonographer. Common bile duct: Diameter: 0.4 cm Liver: No focal lesion identified. Within normal limits in parenchymal echogenicity. IMPRESSION: Negative for gallstones.  Negative exam. Electronically Signed   By: Inge Rise M.D.   On: 02/27/2015 12:15   I have personally reviewed and evaluated these images and lab results as part of my medical decision-making.   EKG Interpretation   Date/Time:  Monday February 27 2015 10:47:11 EST Ventricular Rate:  70 PR Interval:  166 QRS Duration: 100 QT Interval:  446 QTC Calculation: 481 R Axis:   19 Text Interpretation:  Sinus rhythm with frequent Premature ventricular  complexes Minimal voltage criteria for LVH, may be normal variant  Prolonged QT Abnormal ECG no significant change since Nov 2016 Confirmed  by Regenia Skeeter  MD, SCOTT [phone]) on 02/27/2015 11:26:30 AM       MDM   Final diagnoses:  RUQ abdominal pain  Palpitations  Prolonged Q-T interval on ECG    Filed Vitals:   02/27/15 1057 02/27/15 1130 02/27/15 1330 02/27/15 1411  BP: 126/65 135/72 130/84 136/83  Pulse: 72 73 63 62  Temp: 97.8 F (36.6 C)     TempSrc: Oral     Resp: 18   18  SpO2: 100% 98% 97% 96%    Medications  morphine 4 MG/ML injection 4 mg (4 mg Intravenous Given 02/27/15 1145)  famotidine (PEPCID) IVPB 20 mg premix (0 mg Intravenous Stopped 02/27/15 1408)  dicyclomine (BENTYL) capsule 10 mg (10 mg Oral Given 02/27/15 1144)    Mark [name] is 49 y.o. male presenting with multiple complaints including abdominal pain, nausea, chest pain, dyspnea on exertion, shortness of breath, cough, palpitations, presyncopal sensation. EKG with prolonged QT and multiple PVCs. Will check abdominal labs, chest x-ray, cardiac labs, ultrasound right upper quadrant. QTC today is under 500, troponin negative, blood work reassuring, chest x-ray negative, right upper quadrant ultrasound with no abnormalities.  Harwani mild CAD with 45% EF, recommends  that this patient take his metoprolol 50 mg twice a day take an extra 20 5 at night, stressed the importance of medication compliant and will be seen in the office as an outpatient the next few days.  Evaluation does not show pathology that would require ongoing emergent intervention or inpatient treatment. Pt is hemodynamically stable and mentating appropriately. Discussed findings and plan with patient/guardian, who agrees with care plan. All questions answered. Return precautions discussed and outpatient follow up given.   Discharge Medication List as of 02/27/2015  1:53 PM    START taking these medications   Details  dicyclomine (BENTYL) 10 MG capsule Take 2 capsules (20  mg total) by mouth 4 (four) times daily as needed for spasms., Starting 02/27/2015, Until Discontinued, [employer], PA-C 02/27/15 1458  Sherwood Gambler, MD 03/03/15 1407

## 2015-02-27 NOTE — ED Notes (Signed)
Pt here for abd pain, bloating nausea and diarrhea. sts x 2 weeks.

## 2015-02-27 NOTE — Telephone Encounter (Signed)
Spoke with patient and he states he tried to have 24 probe but they could not pass the tube. He reports nausea x 2 weeks and now abdominal pain that sent him to ED. Bentyl has helped some. Scheduled with Cecille Rubin Hvozdovic, PA-C at 2:45 PM tomorrow.

## 2015-02-28 ENCOUNTER — Ambulatory Visit (INDEPENDENT_AMBULATORY_CARE_PROVIDER_SITE_OTHER): Payer: No Typology Code available for payment source | Admitting: Physician Assistant

## 2015-02-28 ENCOUNTER — Encounter: Payer: Self-pay | Admitting: Physician Assistant

## 2015-02-28 VITALS — BP 124/80 | HR 68 | Ht 75.2 in | Wt 224.5 lb

## 2015-02-28 DIAGNOSIS — R1310 Dysphagia, unspecified: Secondary | ICD-10-CM

## 2015-02-28 DIAGNOSIS — K589 Irritable bowel syndrome without diarrhea: Secondary | ICD-10-CM

## 2015-02-28 DIAGNOSIS — K219 Gastro-esophageal reflux disease without esophagitis: Secondary | ICD-10-CM

## 2015-02-28 DIAGNOSIS — R110 Nausea: Secondary | ICD-10-CM

## 2015-02-28 DIAGNOSIS — R140 Abdominal distension (gaseous): Secondary | ICD-10-CM

## 2015-02-28 MED ORDER — DICYCLOMINE HCL 20 MG PO TABS: 20.0000 mg | ORAL_TABLET | Freq: Three times a day (TID) | ORAL | Status: DC | PRN

## 2015-02-28 MED ORDER — METOCLOPRAMIDE HCL 5 MG/5ML PO SOLN: 5.0000 mg | Freq: Three times a day (TID) | ORAL | Status: DC

## 2015-02-28 MED ORDER — RIFAXIMIN 550 MG PO TABS: 550.0000 mg | ORAL_TABLET | Freq: Three times a day (TID) | ORAL | Status: DC

## 2015-02-28 MED ORDER — SUCRALFATE 1 GM/10ML PO SUSP: 1.0000 g | Freq: Four times a day (QID) | ORAL | Status: DC

## 2015-02-28 NOTE — Patient Instructions (Addendum)
We have sent the following medications to your pharmacy for you to pick up at your convenience: Reglan, Carafate, Bentyl and Xifaxan  We have sent the Xifaxan to [facility] pharmacy. You should hear from them in a few days.  Please follow up with Dr Havery Moros in one month. Please call [phone] to schedule.

## 2015-03-01 ENCOUNTER — Encounter: Payer: Self-pay | Admitting: Physician Assistant

## 2015-03-01 NOTE — Progress Notes (Signed)
Patient ID: DIERK MIKAN, male   DOB: 04-18-1965, 49 y.o.   MRN: [id number]     History of Present Illness: Mark [name]  Is a 49 year old gentleman who is known to Dr. Havery Moros. He has been evaluated for upper GI symptoms and ongoing reflux symptoms in the past. He had a Nissen fundoplication in [id number] for reflux symptoms that were refractory to PPI. He says his Nissen helps decrease his heartburn symptoms but since then he has had difficulty swallowing and feels. Gassy and bloated. He uses Prevacid OTC 15 mg twice daily for heartburn which helps to some degree but does not diminish his heartburn entirely. He states he is unable to vomit or burp since his surgery. He had an EGD on 12/12/2014 by Dr. Havery Moros  That showed a normal esophagus without esophagitis. Intact Nissen fundoplication, which was felt to be the likely cause of the patient's dysphagia. Dilation was not performed given the patient's symptoms of reflux. He was also noted to have mild gastritis. Biopsies were negative for H. Pylori. He was advised to undergo a 24-hour pH probe. He reports he had 2 attempts but states "they were not able to get the tube passed my throat ".    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  He states that he has frequent PVCs and reports he was advised that he would likely need a pacemaker at some point due to prolonged QT. He has presyncopal sensations that of been worsening over the past several weeks. He reports dyspnea on exertion. He was recently in the emergency room with complaints of epigastric pain. Abdominal ultrasound showed no gallstones or wall thickening. No sonographic Murphy sign, common bile duct 0.4 cm. Liver had no focal lesion identified. Parenchymal echogenicity was within normal limits.  Laboratory studies revealed a total bili of 0.6, alkaline phosphatase of 90, ALT  22, AST 21. Lipase 51. CBC had WBC 8.3, hemoglobin 14.5, hematocrit 43.8, platelets 257,000.    today he is complaining of "liquid fire " whenever area he feels very bloated in the epigastric area. He states his last drink of alcohol was 3 weeks ago. He denies use of nonsteroidal anti-inflammatories. He states he is nauseous all the time and feels full after 2 bites he feels his food is not moving out of his stomach. He has had no bright red blood per rectum or melena. He does complain of increased flatulence and occasional days of loose stool alternating with days of formed stool.   Past Medical History  Diagnosis Date  . 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 ETOH abuse   . Mental disorder   . Irregular heart beat   . Shortness of breath   . Headache(784.0)   . Hepatitis   . Alcohol withdrawal ([place])   . Neuropathy ([place]) 06/07/2013  . 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 Seizure due to alcohol withdrawal ([place]) 01/23/2014    Per patient report  . Anxiety Dx 2014  . GERD (gastroesophageal reflux disease) Dx 2003  . Hyperlipidemia Dx 2000  . Hypertension Dx 2011  . Withdrawal seizures ([place]) 2014  . COPD (chronic obstructive pulmonary disease) [facility])     Past Surgical History  Procedure Laterality Date  . Back surgery    . Neck surgery    . Fundoplasty transthoracic  2003  .  Nasal sinus surgery    . Left heart catheterization with coronary angiogram N/A 01/13/2014    Procedure: LEFT HEART CATHETERIZATION WITH CORONARY ANGIOGRAM;  Surgeon: Clent Demark, MD;  Location: [facility] CATH LAB;  Service: Cardiovascular;  Laterality: N/A;  . Subacromial decompression Right 11/16/2014    Procedure: RIGHT SHOULDER ARTHROSCOPY WITH SUBACROMIAL DECOMPRESSION ;  Surgeon: Leandrew Koyanagi, MD;  Location: [facility];  Service: Orthopedics;  Laterality: Right;  . Nissen fundoplication    . 24 hour ph study N/A 01/16/2015    Procedure: 24 HOUR PH STUDY;  Surgeon:  Manus Gunning, MD;  Location: [facility] ENDOSCOPY;  Service: Gastroenterology;  Laterality: N/A;   Family History  Problem Relation Age of Onset  . Breast cancer Paternal Grandmother   . Lung cancer Father     was a smoker  . Hypertension Mother   . Colon polyps Mother    Social History  Substance Use Topics  . Smoking status: Current Every Day Smoker -- 0.50 packs/day for 30 years    Types: Cigarettes  . Smokeless tobacco: Never Used     Comment: 11-30-2014 info given  . Alcohol Use: 0.0 oz/week    0 Standard drinks or equivalent per week     Comment: occ   Current Outpatient Prescriptions  Medication Sig Dispense Refill  . albuterol (PROVENTIL HFA;VENTOLIN HFA) 108 (90 BASE) MCG/ACT inhaler Inhale 2 puffs into the lungs every 4 (four) hours as needed for wheezing or shortness of breath. 6.7 g 2  . cyclobenzaprine (FLEXERIL) 10 MG tablet Take 1 tablet (10 mg total) by mouth 2 (two) times daily as needed for muscle spasms. 60 tablet 0  . gabapentin (NEURONTIN) 400 MG capsule Take 2 capsules (800 mg total) by mouth 3 (three) times daily. For substance withdrawal syndrome/paina management 180 capsule 5  . lansoprazole (PREVACID) 15 MG capsule Take 15 mg by mouth 2 (two) times daily before a meal.     . metoprolol (LOPRESSOR) 50 MG tablet Take 1 tablet (50 mg total) by mouth 2 (two) times daily. 60 tablet 11  . QUEtiapine (SEROQUEL) 100 MG tablet Take 75 mg by mouth at bedtime. 3/4 tablet - one hour before bedtime  3  . sulfamethoxazole-trimethoprim (BACTRIM DS,SEPTRA DS) 800-160 MG tablet Take 1 tablet by mouth 2 (two) times daily.  0  . tamsulosin (FLOMAX) 0.4 MG CAPS capsule Take 0.8 mg by mouth daily after supper.    . dicyclomine (BENTYL) 20 MG tablet Take 1 tablet (20 mg total) by mouth 3 (three) times daily as needed for spasms. 90 tablet 1  . metoCLOPramide (REGLAN) 5 MG/5ML solution Take 5 mLs (5 mg total) by mouth 3 (three) times daily before meals. Take 43ml twice a day before  meals and before bedtime for 3 days. 60 mL 0  . rifaximin (XIFAXAN) 550 MG TABS tablet Take 1 tablet (550 mg total) by mouth 3 (three) times daily. 42 tablet 0  . sucralfate (CARAFATE) 1 GM/10ML suspension Take 10 mLs (1 g total) by mouth 4 (four) times daily. 1200 mL 0   No current facility-administered medications for this visit.   Allergies  Allergen Reactions  . Lisinopril Anaphylaxis and Swelling    Per Dr. Terrence Dupont (12/20/13), patient can tolerate losartan.  . Cozaar [Losartan] Swelling    Caused swelling per pt     Review of Systems:  per history of present illness otherwise negative. LAB RESULTS:  Recent Labs  02/27/15 1118  WBC 8.3  HGB 14.5  HCT 43.8  PLT 257   BMET  Recent Labs  02/27/15 1118  NA 137  K 3.5  CL 102  CO2 25  GLUCOSE 138*  BUN 12  CREATININE 0.99  CALCIUM 9.3   LFT  Recent Labs  02/27/15 1118  PROT 6.4*  ALBUMIN 4.2  AST 21  ALT 22  ALKPHOS 90  BILITOT 0.6    Studies:   Dg Chest 2 View  02/27/2015  CLINICAL DATA:  Palpitations and abdominal pain. EXAM: CHEST  2 VIEW COMPARISON:  07/22/2014 and 04/12/2014. FINDINGS: Lungs are adequately inflated without consolidation, effusion or pneumothorax. Cardiomediastinal silhouette is within normal. There are mild degenerate changes of the spine. Fusion hardware is present over the cervical thoracic junction as there is continued evidence of fracture of the 2 most superior screws. IMPRESSION: No active cardiopulmonary disease. Electronically Signed   By: Marin Olp M.D.   On: 02/27/2015 12:20   US Abdomen Limited Ruq  02/27/2015  CLINICAL DATA:  Right upper quadrant pain and nausea for 2 weeks. Initial encounter. EXAM: US ABDOMEN LIMITED - RIGHT UPPER QUADRANT COMPARISON:  CT abdomen and pelvis 04/12/2014. FINDINGS: Gallbladder: No gallstones or wall thickening visualized. No sonographic Murphy sign noted by sonographer. Common bile duct: Diameter: 0.4 cm Liver: No focal lesion  identified. Within normal limits in parenchymal echogenicity. IMPRESSION: Negative for gallstones.  Negative exam. Electronically Signed   By: Inge Rise M.D.   On: 02/27/2015 12:15     Physical Exam: BP 124/80 mmHg  Pulse 68  Ht 6' 3.2" (1.91 m)  Wt 224 lb 8 oz (101.833 kg)  BMI 27.91 kg/m2 General: Pleasant, well developed ,  Caucasian male in no acute distress Head: Normocephalic and atraumatic Eyes:  sclerae anicteric, conjunctiva pink  Ears: Normal auditory acuity Lungs: Clear throughout to auscultation Heart: Regular rate and rhythm Abdomen: Soft, non distended,  Mild epigastric tenderness to palpation without rebound or guarding, No masses, no hepatomegaly. Normal bowel sounds Musculoskeletal: Symmetrical with no gross deformities  Extremities: No edema  Neurological: Alert oriented x 4, grossly nonfocal Psychological:  Alert and cooperative. Normal mood and affect  Assessment and Recommendations:   49 year old male with a long-standing history of GERD status post Nissen fundoplication. He is now experiencing early satiety , and nausea and not alleviated or exacerbated his food. They have a component of gastroparesis. He will be given a brief trial of Reglan syrup 5 mg per 5 ML's. He's been instructed to use 5 ML's before meals and at bedtime for 3 days and then discontinue it. He's been advised of the possible side effects of Reglan and was told to discontinue it if he has any trouble tolerating it. He will also be given a trial of Carafate suspension 1 g per 10 ML's , 10 ML's before meals and at bedtime for 30 days. He will continue his Prevacid OTC twice a day.    as regards to his bloating and excess flatulence, he may have a component of bacterial overgrowth due to overall dysmotility. [facility]an 550 mg 1 by mouth 3 times daily for 14 days. If this is not covered by his insurance plan, we will substitute Flagyl 250 mg 3  times a day for 7-10 days , but the patient would need to be reminded to abstain from alcohol while on the Flagyl and for 3 days after completion of the Flagyl. He will also be given a  trial of Bentyl 20 mg 1 by mouth 3 times a day. He will follow up in one month, sooner if needed.       Lilybelle Mayeda, Deloris Ping 03/01/2015,

## 2015-03-02 NOTE — Progress Notes (Signed)
Agree with assessment and plan as outlined. He could have gas-bloat syndrome following his Nissen driving these symptoms, which can be associated with undiagnosed gastroparesis. I would consider a gastric emptying study in this patient to further evaluate his symptoms. I agree with empiric trial Reglan but he will have to hold this well in advance of gastric emptying study. Given prolonged QT he is not a candidate for domperidone. I otherwise agree with empiric trial of Rifaximin and may want to consider a low FODMOP diet.

## 2015-03-03 NOTE — Telephone Encounter (Signed)
Pt. Called stating that he was prescribed Tylenol #3 for pain and stated it does not seem to be working. Pt. Would like to know if PCP can sent him a Rx for Tramadol. Please f/u with pt.

## 2015-03-07 ENCOUNTER — Telehealth: Payer: Self-pay | Admitting: Emergency Medicine

## 2015-03-07 NOTE — Telephone Encounter (Signed)
Xifaxan rep called to inform us that since patient does not have insurance it will be over $1000 with patient assistance. Can you substitute another medication?

## 2015-03-08 ENCOUNTER — Other Ambulatory Visit: Payer: Self-pay | Admitting: Emergency Medicine

## 2015-03-08 MED ORDER — METRONIDAZOLE 250 MG PO TABS: 250.0000 mg | ORAL_TABLET | Freq: Three times a day (TID) | ORAL | Status: DC

## 2015-03-08 NOTE — Telephone Encounter (Signed)
Patient informed. Stressed the importance of abstaining from alcohol. [facility]

## 2015-03-08 NOTE — Telephone Encounter (Signed)
He can try flagyl 250 mg tid x 10 days. Please let he know he has to abstain from ETOH while on flagyl and for 3 days after completion.

## 2015-03-14 ENCOUNTER — Other Ambulatory Visit: Payer: Self-pay | Admitting: Family Medicine

## 2015-03-14 ENCOUNTER — Telehealth: Payer: Self-pay | Admitting: *Deleted

## 2015-03-14 NOTE — Telephone Encounter (Signed)
Lori P Hvozdovic, PA-C at 03/14/2015 2:06 PM     Status: Signed       Expand All Collapse All   Rollene Fare, Re: Mark [name]. Please see his office know. House he feeling. Did he tried the Reglan? If so, didn't help? If not, we need to schedule him for a 4 hour gastric emptying scan. Did he try Xifaxan or Flagyl?        Spoke with patient and he states the Bentyl helped him a lot. He has not tried Reglan yet but states he is going to try it. He has just gotten Flagyl and has not started it yet.

## 2015-03-14 NOTE — Progress Notes (Signed)
Rollene Fare, Re: Mark [name]. Please see his office know. House he feeling. Did he tried the Reglan? If so, didn't help? If not, we need to schedule him for a 4 hour gastric emptying scan. Did he  try Xifaxan or Flagyl?

## 2015-03-15 ENCOUNTER — Other Ambulatory Visit: Payer: Self-pay | Admitting: Family Medicine

## 2015-03-15 DIAGNOSIS — G8929 Other chronic pain: Secondary | ICD-10-CM

## 2015-03-15 DIAGNOSIS — M545 Low back pain: Secondary | ICD-10-CM

## 2015-03-15 DIAGNOSIS — G894 Chronic pain syndrome: Secondary | ICD-10-CM

## 2015-03-15 MED FILL — CARAFATE 1 GM/10 ML SUSP: 1 | 10 days supply | Qty: 420 | Fill #0

## 2015-03-15 MED FILL — METOCLOPRAMIDE 5 MG/5 ML SY: 5 | 3 days supply | Qty: 60 | Fill #0

## 2015-03-15 MED FILL — metroNIDAZOLE 250 MG TABS: 250 | 10 days supply | Qty: 30 | Fill #0

## 2015-03-15 NOTE — Telephone Encounter (Signed)
Pt. Called requesting a med refill on Tylenol # 3. Please f/u with pt. °

## 2015-03-15 NOTE — Telephone Encounter (Signed)
Pt. Called requesting a med refill for Tylenol # 3. Please f/u with pt.

## 2015-03-17 MED ORDER — ACETAMINOPHEN-CODEINE #3 300-30 MG PO TABS: 1.0000 | ORAL_TABLET | Freq: Three times a day (TID) | ORAL | Status: DC | PRN

## 2015-03-17 MED FILL — ACETAMINOPHEN/COD #3 TABLET: 300-30 | 30 days supply | Qty: 180 | Fill #0

## 2015-03-17 NOTE — Telephone Encounter (Signed)
Patient verified DOB Patient informed of prescription being placed at the front desk and ready for pickup. Patient stated he would head this way shortly. Patient had no further questions.

## 2015-03-17 NOTE — Telephone Encounter (Signed)
Tylenol #3 ready for pick up   

## 2015-03-20 ENCOUNTER — Ambulatory Visit: Payer: Self-pay | Admitting: Family Medicine

## 2015-03-22 MED FILL — GABAPENTIN 400 MG CAPSULE: 400 | 30 days supply | Qty: 180 | Fill #1

## 2015-03-22 MED FILL — ?METOPROLOL 50 MG TABLET: 50 | 30 days supply | Qty: 60 | Fill #8

## 2015-03-22 NOTE — Telephone Encounter (Signed)
Rx refills

## 2015-03-28 MED FILL — DICYCLOMINE 20 MG TABLET: 20 | 30 days supply | Qty: 90 | Fill #1

## 2015-04-03 ENCOUNTER — Encounter: Payer: Self-pay | Admitting: Family Medicine

## 2015-04-03 ENCOUNTER — Ambulatory Visit: Payer: No Typology Code available for payment source | Attending: Family Medicine | Admitting: Family Medicine

## 2015-04-03 VITALS — BP 116/75 | HR 64 | Temp 97.4°F | Resp 99 | Ht 75.2 in | Wt 220.0 lb

## 2015-04-03 DIAGNOSIS — M25511 Pain in right shoulder: Secondary | ICD-10-CM | POA: Insufficient documentation

## 2015-04-03 DIAGNOSIS — G894 Chronic pain syndrome: Secondary | ICD-10-CM | POA: Insufficient documentation

## 2015-04-03 DIAGNOSIS — F319 Bipolar disorder, unspecified: Secondary | ICD-10-CM | POA: Insufficient documentation

## 2015-04-03 DIAGNOSIS — M549 Dorsalgia, unspecified: Secondary | ICD-10-CM | POA: Insufficient documentation

## 2015-04-03 DIAGNOSIS — M5416 Radiculopathy, lumbar region: Secondary | ICD-10-CM

## 2015-04-03 DIAGNOSIS — Z0000 Encounter for general adult medical examination without abnormal findings: Secondary | ICD-10-CM

## 2015-04-03 DIAGNOSIS — Z79899 Other long term (current) drug therapy: Secondary | ICD-10-CM | POA: Insufficient documentation

## 2015-04-03 DIAGNOSIS — M545 Low back pain: Secondary | ICD-10-CM | POA: Insufficient documentation

## 2015-04-03 DIAGNOSIS — F3130 Bipolar disorder, current episode depressed, mild or moderate severity, unspecified: Secondary | ICD-10-CM

## 2015-04-03 DIAGNOSIS — F17210 Nicotine dependence, cigarettes, uncomplicated: Secondary | ICD-10-CM | POA: Insufficient documentation

## 2015-04-03 DIAGNOSIS — M5410 Radiculopathy, site unspecified: Secondary | ICD-10-CM

## 2015-04-03 DIAGNOSIS — F419 Anxiety disorder, unspecified: Secondary | ICD-10-CM | POA: Insufficient documentation

## 2015-04-03 DIAGNOSIS — G8929 Other chronic pain: Secondary | ICD-10-CM | POA: Insufficient documentation

## 2015-04-03 DIAGNOSIS — Z9889 Other specified postprocedural states: Secondary | ICD-10-CM | POA: Insufficient documentation

## 2015-04-03 LAB — VITAMIN B12: VITAMIN B 12: 418 pg/mL (ref 211–911)

## 2015-04-03 LAB — POCT GLYCOSYLATED HEMOGLOBIN (HGB A1C): HEMOGLOBIN A1C: 6

## 2015-04-03 MED ORDER — ACETAMINOPHEN-CODEINE #3 300-30 MG PO TABS: 1.0000 | ORAL_TABLET | Freq: Four times a day (QID) | ORAL | Status: DC | PRN

## 2015-04-03 MED ORDER — TRAZODONE HCL 50 MG PO TABS: 25.0000 mg | ORAL_TABLET | Freq: Every evening | ORAL | Status: DC | PRN

## 2015-04-03 MED ORDER — ADJUSTABLE ALUMINUM CANE MISC: 1.0000 | Freq: Every day | Status: DC

## 2015-04-03 MED FILL — ACETAMINOPHEN/COD #3 TABLET: 300-30 | 30 days supply | Qty: 240 | Fill #0

## 2015-04-03 MED FILL — traZODone HCL 50 MG TABS: 50 | 30 days supply | Qty: 30 | Fill #0

## 2015-04-03 NOTE — Assessment & Plan Note (Signed)
Chronic pain Continue current pain management Cane Rx provided Parking placcard form completed B12 check   Disability functional capacity form completed

## 2015-04-03 NOTE — Progress Notes (Signed)
Patient ID: Mark [name], male   DOB: 1966/02/22, 50 y.o.   MRN: [id number]   Subjective:  Patient ID: Mark [name], male    DOB: 1966/03/09  Age: 50 y.o. MRN: [id number]  CC: Back Pain   HPI Mark [name] presents for   1. 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 Has had pain since mid 90s. First back surgery was in 1994/1995. Pain became debilitating in 2000. Pain is low back, radiates down posterior thighs. He has pain in feet also. Pain with standing. Numbness in feet. Deep aches exacerbated by standing, walking or sitting in one position. He continues to smoke. Gabapentin helps. Tylenol #3 helps. He was previously on oxycodone as well. He had had 2 falls in past year related to his pain. He request Rx for cane. He request parking placcard. He is uninsured. He is unemployed. He is applying for medical disability.   2. R shoulder pain: chronic pain. He has improved ROM since R subacromial decompression surgery in 11/2014. He still gets pain in hand that limits use of his R hand. He is R handed. He has no pain or limitation in his L hand.   3. Bipolar depression and anxiety: he request seroquel and trazodone. He reports going to [facility], [facility] the [place] and [place] for psychiatric care. He did not like the care at [facility] stating it was too impersonal. He reports seeing psychologist but not psychiatrist at [facility]. He denies mania and SI. He reports depressed mood and trouble sleeping. He denies ETOH and substance abuse.   Social History  Substance Use Topics  . Smoking status: Current Every Day Smoker -- 0.50 packs/day for 30 years    Types: Cigarettes  . Smokeless tobacco: Never Used     Comment: 11-30-2014 info given  . Alcohol Use: 0.0 oz/week    0 Standard drinks or equivalent per week     Comment: occ   Past Surgical History  Procedure Laterality Date  . Back surgery  1995, 1999  . Neck surgery   2000  . Fundoplasty transthoracic  2003  . Nasal sinus surgery    . Left heart catheterization with coronary angiogram N/A 01/13/2014    Procedure: LEFT HEART CATHETERIZATION WITH CORONARY ANGIOGRAM;  Surgeon: Clent Demark, MD;  Location[facility] CATH LAB;  Service: Cardiovascular;  Laterality: N/A;  . Subacromial decompression Right 11/16/2014    Procedure: RIGHT SHOULDER ARTHROSCOPY WITH SUBACROMIAL DECOMPRESSION ;  Surgeon: Leandrew Koyanagi, MD;  Location: [facility];  Service: Orthopedics;  Laterality: Right;  . Nissen fundoplication    . 24 hour ph study N/A 01/16/2015    Procedure: 24 HOUR PH STUDY;  Surgeon: Manus Gunning, MD;  Location: [facility];  Service: Gastroenterology;  Laterality: N/A;     Outpatient Prescriptions Prior to Visit  Medication Sig Dispense Refill  . acetaminophen-codeine (TYLENOL #3) 300-30 MG tablet Take 1-2 tablets by mouth every 8 (eight) hours as needed for moderate pain. 180 tablet 0  . albuterol (PROVENTIL HFA;VENTOLIN HFA) 108 (90 BASE) MCG/ACT inhaler Inhale 2 puffs into the lungs every 4 (four) hours as needed for wheezing or shortness of breath. 6.7 g 2  . cyclobenzaprine (FLEXERIL) 10 MG tablet Take 1 tablet (10 mg total) by mouth 2 (two) times daily as needed for muscle spasms. 60 tablet 0  . dicyclomine (BENTYL) 20 MG tablet Take 1 tablet (20 mg total) by  mouth 3 (three) times daily as needed for spasms. 90 tablet 1  . gabapentin (NEURONTIN) 400 MG capsule Take 2 capsules (800 mg total) by mouth 3 (three) times daily. For substance withdrawal syndrome/paina management 180 capsule 5  . lansoprazole (PREVACID) 15 MG capsule Take 15 mg by mouth 2 (two) times daily before a meal.     . metoprolol (LOPRESSOR) 50 MG tablet Take 1 tablet (50 mg total) by mouth 2 (two) times daily. 60 tablet 11  . tamsulosin (FLOMAX) 0.4 MG CAPS capsule Take 0.8 mg by mouth daily after supper.    . metroNIDAZOLE (FLAGYL) 250 MG tablet Take 1 tablet (250 mg  total) by mouth 3 (three) times daily. 30 tablet 0  . metoCLOPramide (REGLAN) 5 MG/5ML solution Take 5 mLs (5 mg total) by mouth 3 (three) times daily before meals. Take 88ml twice a day before meals and before bedtime for 3 days. (Patient not taking: Reported on 04/03/2015) 60 mL 0  . QUEtiapine (SEROQUEL) 100 MG tablet Take 75 mg by mouth at bedtime. Reported on 04/03/2015  3  . sucralfate (CARAFATE) 1 GM/10ML suspension Take 10 mLs (1 g total) by mouth 4 (four) times daily. (Patient not taking: Reported on 04/03/2015) 1200 mL 0  . rifaximin (XIFAXAN) 550 MG TABS tablet Take 1 tablet (550 mg total) by mouth 3 (three) times daily. 42 tablet 0  . sulfamethoxazole-trimethoprim (BACTRIM DS,SEPTRA DS) 800-160 MG tablet Take 1 tablet by mouth 2 (two) times daily.  0   No facility-administered medications prior to visit.    ROS Review of Systems  Constitutional: Negative for fever, chills, fatigue and unexpected weight change.  Eyes: Negative for visual disturbance.  Respiratory: Negative for cough and shortness of breath.   Cardiovascular: Negative for chest pain, palpitations and leg swelling.  Gastrointestinal: Negative for nausea, vomiting, abdominal pain, diarrhea, constipation and blood in stool.  Endocrine: Negative for polydipsia, polyphagia and polyuria.  Musculoskeletal: Positive for back pain, arthralgias and gait problem. Negative for myalgias and neck pain.  Skin: Negative for rash.  Allergic/Immunologic: Negative for immunocompromised state.  Neurological: Positive for numbness (in R hand and feet ).  Hematological: Negative for adenopathy. Does not bruise/bleed easily.  Psychiatric/Behavioral: Positive for sleep disturbance and decreased concentration. Negative for suicidal ideas and dysphoric mood. The patient is nervous/anxious.     Objective:  BP 116/75 mmHg  Pulse 64  Temp(Src) 97.4 F (36.3 C) (Oral)  Resp 99  Ht 6' 3.2" (1.91 m)  Wt 220 lb (99.791 kg)  BMI 27.35 kg/m2   SpO2 99%  BP/Weight 04/03/2015 02/28/2015 0000000  Systolic BP [id number]  Diastolic BP 75 80 83  Wt. (Lbs) 220 224.5 -  BMI 27.35 27.91 -  Some encounter information is confidential and restricted. Go to Review Flowsheets activity to see all data.    Physical Exam  Constitutional: He appears well-developed and well-nourished. No distress.  HENT:  Head: Normocephalic and atraumatic.  Neck: Normal range of motion. Neck supple.  Cardiovascular: Normal rate, regular rhythm, normal heart sounds and intact distal pulses.   Pulmonary/Chest: Effort normal and breath sounds normal.  Musculoskeletal: He exhibits tenderness. He exhibits no edema.       Lumbar back: He exhibits decreased range of motion, tenderness, pain and spasm. He exhibits no bony tenderness, no swelling, no edema, no deformity, no laceration and normal pulse.  Neurological: He is alert. Gait abnormal.  Skin: Skin is warm and dry. No rash noted. No erythema.  Psychiatric:  He has a normal mood and affect.   Lab Results  Component Value Date   HGBA1C 6.0 04/03/2015   Depression screen [facility] 2/9 04/03/2015 02/13/2015 11/03/2014  Decreased Interest 2 3 3   Down, Depressed, Hopeless 2 2 2   PHQ - 2 Score 4 5 5   Altered sleeping 2 3 2   Tired, decreased energy 3 2 3   Change in appetite 2 2 2   Feeling bad or failure about yourself  1 1 0  Trouble concentrating 2 2 2   Moving slowly or fidgety/restless 0 0 1  Suicidal thoughts 0 0 0  PHQ-9 Score 14 15 15     GAD 7 : Generalized Anxiety Score 02/13/2015  Nervous, Anxious, on Edge 3  Control/stop worrying 2  Worry too much - different things 2  Trouble relaxing 3  Restless 3  Easily annoyed or irritable 3  Afraid - awful might happen 0  Total GAD 7 Score 16    Assessment & Plan:   Problem List Items Addressed This Visit    Chronic radicular low back pain   Relevant Medications   acetaminophen-codeine (TYLENOL #3) 300-30 MG tablet   traZODone (DESYREL) 50 MG tablet    Misc. Devices (ADJUSTABLE ALUMINUM CANE) MISC   Other Relevant Orders   Vitamin B12   Chronic pain syndrome (Chronic)   Relevant Medications   acetaminophen-codeine (TYLENOL #3) 300-30 MG tablet   Chronic anxiety (Chronic)   Relevant Medications   traZODone (DESYREL) 50 MG tablet   Other Relevant Orders   Ambulatory referral to [facility]   Bipolar depression ([place]) (Chronic)   Relevant Medications   traZODone (DESYREL) 50 MG tablet   Other Relevant Orders   Ambulatory referral to [facility]    Other Visit Diagnoses    Healthcare maintenance    -  Primary    Relevant Orders    POCT glycosylated hemoglobin (Hb A1C) (Completed)       No orders of the defined types were placed in this encounter.    Follow-up: No Follow-up on file.   Boykin Nearing MD

## 2015-04-03 NOTE — Patient Instructions (Addendum)
Mark [name] was seen today for back pain.  Diagnoses and all orders for this visit:  Healthcare maintenance -     POCT glycosylated hemoglobin (Hb A1C)  Chronic pain syndrome -     Discontinue: acetaminophen-codeine (TYLENOL #3) 300-30 MG tablet; Take 1-2 tablets by mouth every 6 (six) hours as needed for moderate pain. -     acetaminophen-codeine (TYLENOL #3) 300-30 MG tablet; Take 1-2 tablets by mouth every 6 (six) hours as needed for moderate pain.  Chronic radicular low back pain -     Discontinue: acetaminophen-codeine (TYLENOL #3) 300-30 MG tablet; Take 1-2 tablets by mouth every 6 (six) hours as needed for moderate pain. -     Vitamin B12 -     acetaminophen-codeine (TYLENOL #3) 300-30 MG tablet; Take 1-2 tablets by mouth every 6 (six) hours as needed for moderate pain. -     Misc. Devices (ADJUSTABLE ALUMINUM CANE) MISC; 1 each by Does not apply route daily.  Bipolar depression ([place]) -     traZODone (DESYREL) 50 MG tablet; Take 0.5-1 tablets (25-50 mg total) by mouth at bedtime as needed for sleep. -     Ambulatory referral to [facility]  Chronic anxiety -     Ambulatory referral to [place]   F.u in 2 months   Dr. Adrian Blackwater

## 2015-04-03 NOTE — Assessment & Plan Note (Signed)
History of bipolar depression with ongoing insomnia and anxiety in setting of chronic pain and unemployment  Behavioral health referral placed for additional assessment and treatment recommendations Trazodone restarted

## 2015-04-03 NOTE — Progress Notes (Signed)
F/U Back pain  Medicine refills and handicap paper work  Pain scale#7 Tobacco user 1/2 ppday  No suicidal thought in the past two weeks

## 2015-04-14 ENCOUNTER — Telehealth: Payer: Self-pay | Admitting: *Deleted

## 2015-04-14 NOTE — Telephone Encounter (Signed)
-----   Message from Boykin Nearing, MD sent at 04/04/2015  8:18 AM EST ----- Normal vit B12 level

## 2015-04-14 NOTE — Telephone Encounter (Signed)
Date of birth verified by pt  Normal results given to pt  Pt verbalized understanding

## 2015-04-21 MED FILL — GABAPENTIN 400 MG CAPSULE: 400 | 30 days supply | Qty: 180 | Fill #2

## 2015-04-21 MED FILL — METOPROLOL TARTRATE 50 MG T: 50 | 30 days supply | Qty: 60 | Fill #9

## 2015-04-24 ENCOUNTER — Other Ambulatory Visit: Payer: Self-pay | Admitting: Family Medicine

## 2015-04-24 DIAGNOSIS — G894 Chronic pain syndrome: Secondary | ICD-10-CM

## 2015-04-24 DIAGNOSIS — G8929 Other chronic pain: Secondary | ICD-10-CM

## 2015-04-24 DIAGNOSIS — M5416 Radiculopathy, lumbar region: Secondary | ICD-10-CM

## 2015-04-24 NOTE — Telephone Encounter (Signed)
Pt. Called requesting for his Tylenol # 3 to be switched over to Tramadol because the Tylenol # 3 is upsetting his stomach. Please f/u with pt.

## 2015-04-25 NOTE — Telephone Encounter (Signed)
Yes, this change will be made when patient due for his next refill on 05/04/2015

## 2015-04-25 NOTE — Telephone Encounter (Signed)
Pt. Called requesting for his Tylenol # 3 to be switched over to Tramadol because the Tylenol # 3 is upsetting his stomach. Please f/u with pt.

## 2015-04-26 NOTE — Telephone Encounter (Signed)
Pt call requesting changes on medication  Pt was advised changes from Tylenol#3 to Tramadol will be done on next refill On May 04, 2015 Pt verbalized understanding

## 2015-05-02 NOTE — Telephone Encounter (Signed)
Pt. Called requesting a Rx for Tramadol. Please f/u

## 2015-05-02 NOTE — Telephone Encounter (Signed)
Pt calling to speak to Junious Dresser once more about this. Sadie Reynolds, ASA

## 2015-05-03 NOTE — Telephone Encounter (Signed)
Pt requesting Tramadol refill  °

## 2015-05-04 MED ORDER — ACETAMINOPHEN-CODEINE #3 300-30 MG PO TABS: 1.0000 | ORAL_TABLET | Freq: Four times a day (QID) | ORAL | Status: DC | PRN

## 2015-05-04 MED FILL — ACETAMINOPHEN/COD #3 TABLET: 300-30 | 30 days supply | Qty: 240 | Fill #0

## 2015-05-04 NOTE — Telephone Encounter (Signed)
Pt is here asking about this refill. Pt insisted on waiting, although he was informed that clinic was running behind. Sadie Reynolds, ASA

## 2015-05-04 NOTE — Telephone Encounter (Signed)
Tylenol #3 refilled and placed up front for patient pick up Patient waiting in waiting area Tylenol #3 signed out of med book by patient

## 2015-05-09 ENCOUNTER — Encounter: Payer: Self-pay | Admitting: Clinical

## 2015-05-09 NOTE — Progress Notes (Signed)
Depression screen [facility] 2/9 04/03/2015 02/13/2015 11/03/2014 09/16/2014 08/10/2014  Decreased Interest 2 3 3  0 0  Down, Depressed, Hopeless 2 2 2  0 0  PHQ - 2 Score 4 5 5  0 0  Altered sleeping 2 3 2  - -  Tired, decreased energy 3 2 3  - -  Change in appetite 2 2 2  - -  Feeling bad or failure about yourself  1 1 0 - -  Trouble concentrating 2 2 2  - -  Moving slowly or fidgety/restless 0 0 1 - -  Suicidal thoughts 0 0 0 - -  PHQ-9 Score 14 15 15  - -    GAD 7 : Generalized Anxiety Score 04/03/2015 02/13/2015  Nervous, Anxious, on Edge 2 3  Control/stop worrying 1 2  Worry too much - different things 2 2  Trouble relaxing 2 3  Restless 1 3  Easily annoyed or irritable 1 3  Afraid - awful might happen 0 0  Total GAD 7 Score 9 16

## 2015-05-18 MED FILL — METOPROLOL TARTRATE 50 MG T: 50 | 30 days supply | Qty: 60 | Fill #10

## 2015-05-25 ENCOUNTER — Emergency Department (HOSPITAL_COMMUNITY): Payer: No Typology Code available for payment source

## 2015-05-25 ENCOUNTER — Encounter (HOSPITAL_COMMUNITY): Payer: Self-pay

## 2015-05-25 ENCOUNTER — Telehealth: Payer: Self-pay | Admitting: Family Medicine

## 2015-05-25 ENCOUNTER — Emergency Department (HOSPITAL_COMMUNITY)
Admission: EM | Admit: 2015-05-25 | Discharge: 2015-05-25 | Disposition: A | Discharge status: Home / Self Care | Payer: No Typology Code available for payment source | Attending: Emergency Medicine | Admitting: Emergency Medicine

## 2015-05-25 DIAGNOSIS — Z8639 Personal history of other endocrine, nutritional and metabolic disease: Secondary | ICD-10-CM | POA: Insufficient documentation

## 2015-05-25 DIAGNOSIS — R05 Cough: Secondary | ICD-10-CM | POA: Insufficient documentation

## 2015-05-25 DIAGNOSIS — Z9889 Other specified postprocedural states: Secondary | ICD-10-CM | POA: Insufficient documentation

## 2015-05-25 DIAGNOSIS — Z79899 Other long term (current) drug therapy: Secondary | ICD-10-CM | POA: Insufficient documentation

## 2015-05-25 DIAGNOSIS — F419 Anxiety disorder, unspecified: Secondary | ICD-10-CM | POA: Insufficient documentation

## 2015-05-25 DIAGNOSIS — M549 Dorsalgia, unspecified: Secondary | ICD-10-CM

## 2015-05-25 DIAGNOSIS — K219 Gastro-esophageal reflux disease without esophagitis: Secondary | ICD-10-CM | POA: Insufficient documentation

## 2015-05-25 DIAGNOSIS — G8929 Other chronic pain: Secondary | ICD-10-CM | POA: Insufficient documentation

## 2015-05-25 DIAGNOSIS — M546 Pain in thoracic spine: Secondary | ICD-10-CM | POA: Insufficient documentation

## 2015-05-25 DIAGNOSIS — G629 Polyneuropathy, unspecified: Secondary | ICD-10-CM | POA: Insufficient documentation

## 2015-05-25 DIAGNOSIS — I10 Essential (primary) hypertension: Secondary | ICD-10-CM | POA: Insufficient documentation

## 2015-05-25 DIAGNOSIS — G894 Chronic pain syndrome: Secondary | ICD-10-CM

## 2015-05-25 DIAGNOSIS — M5416 Radiculopathy, lumbar region: Secondary | ICD-10-CM

## 2015-05-25 DIAGNOSIS — J449 Chronic obstructive pulmonary disease, unspecified: Secondary | ICD-10-CM | POA: Insufficient documentation

## 2015-05-25 DIAGNOSIS — R0781 Pleurodynia: Secondary | ICD-10-CM | POA: Insufficient documentation

## 2015-05-25 LAB — URINALYSIS, ROUTINE W REFLEX MICROSCOPIC
Bilirubin Urine: NEGATIVE
GLUCOSE, UA: NEGATIVE mg/dL
HGB URINE DIPSTICK: NEGATIVE
Ketones, ur: NEGATIVE mg/dL
LEUKOCYTES UA: NEGATIVE
Nitrite: NEGATIVE
PROTEIN: NEGATIVE mg/dL
Specific Gravity, Urine: 1.013 (ref 1.005–1.030)
pH: 6 (ref 5.0–8.0)

## 2015-05-25 MED ORDER — OXYCODONE-ACETAMINOPHEN 5-325 MG PO TABS
1.0000 | ORAL_TABLET | Freq: Once | ORAL | Status: AC
  Administered 2015-05-25: 1 via ORAL
  Filled 2015-05-25: qty 1

## 2015-05-25 MED FILL — GABAPENTIN 400 MG CAPSULE: 400 | 30 days supply | Qty: 180 | Fill #3

## 2015-05-25 NOTE — Discharge Instructions (Signed)
Please call and follow up with orthopedist for further management of your back pain.  Follow up with your primary care provider for further care.   Back Pain, Adult Back pain is very common in adults.The cause of back pain is rarely dangerous and the pain often gets better over time.The cause of your back pain may not be known. Some common causes of back pain include:  Strain of the muscles or ligaments supporting the spine.  Wear and tear (degeneration) of the spinal disks.  Arthritis.  Direct injury to the back. For many people, back pain may return. Since back pain is rarely dangerous, most people can learn to manage this condition on their own. HOME CARE INSTRUCTIONS Watch your back pain for any changes. The following actions may help to lessen any discomfort you are feeling:  Remain active. It is stressful on your back to sit or stand in one place for long periods of time. Do not sit, drive, or stand in one place for more than 30 minutes at a time. Take short walks on even surfaces as soon as you are able.Try to increase the length of time you walk each day.  Exercise regularly as directed by your health care provider. Exercise helps your back heal faster. It also helps avoid future injury by keeping your muscles strong and flexible.  Do not stay in bed.Resting more than 1-2 days can delay your recovery.  Pay attention to your body when you bend and lift. The most comfortable positions are those that put less stress on your recovering back. Always use proper lifting techniques, including:  Bending your knees.  Keeping the load close to your body.  Avoiding twisting.  Find a comfortable position to sleep. Use a firm mattress and lie on your side with your knees slightly bent. If you lie on your back, put a pillow under your knees.  Avoid feeling anxious or stressed.Stress increases muscle tension and can worsen back pain.It is important to recognize when you are anxious or  stressed and learn ways to manage it, such as with exercise.  Take medicines only as directed by your health care provider. Over-the-counter medicines to reduce pain and inflammation are often the most helpful.Your health care provider may prescribe muscle relaxant drugs.These medicines help dull your pain so you can more quickly return to your normal activities and healthy exercise.  Apply ice to the injured area:  Put ice in a plastic bag.  Place a towel between your skin and the bag.  Leave the ice on for 20 minutes, 2-3 times a day for the first 2-3 days. After that, ice and heat may be alternated to reduce pain and spasms.  Maintain a healthy weight. Excess weight puts extra stress on your back and makes it difficult to maintain good posture. SEEK MEDICAL CARE IF:  You have pain that is not relieved with rest or medicine.  You have increasing pain going down into the legs or buttocks.  You have pain that does not improve in one week.  You have night pain.  You lose weight.  You have a fever or chills. SEEK IMMEDIATE MEDICAL CARE IF:   You develop new bowel or bladder control problems.  You have unusual weakness or numbness in your arms or legs.  You develop nausea or vomiting.  You develop abdominal pain.  You feel faint.   This information is not intended to replace advice given to you by your health care provider. Make sure you  discuss any questions you have with your health care provider.   Document Released: 02/25/2005 Document Revised: 03/18/2014 Document Reviewed: 06/29/2013 Elsevier Interactive Patient Education [employer].

## 2015-05-25 NOTE — ED Notes (Signed)
Pt c/o intermittent mid back pain x 2 weeks and chronic low back pain.  Pain score 9/10.  Pt reports taking Tylenol w/o relief.  Denies injury.

## 2015-05-25 NOTE — ED Provider Notes (Signed)
CSN: [id number]     Arrival date & time 05/25/15  1239 History  By signing my name below, I, Evelene Croon, attest that this documentation has been prepared under the direction and in the presence of non-physician practitioner, Domenic Moras, PA-C. Electronically Signed: Evelene Croon, Scribe. 05/25/2015. 1:37 PM.    Chief Complaint  Patient presents with  . Back Pain    The history is provided by the patient. No language interpreter was used.    HPI Comments:  Mark [name] is a 50 y.o. male with a history of chronic neck and back pain, who presents to the Emergency Department complaining of 9/10 mid back pain x 1 week, worse last night. His pain today is similar to his chronic pain but more intense. His pain is exacerbated with movement and deep inspiration. He has taken tylenol and applied heating pad with minimal relief. He also notes mild dry cough and right sided rib pain with deep inspiration. He denies fever, rash, hematuria, dysuria, and hemoptysis. He also deneis h/o kidney stones, h/o blood clots, recent prolonged periods of immobilization and recent injury/fall.  Pt is able to ambulate.    Past Medical History  Diagnosis Date  . 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 ETOH abuse   . Mental disorder   . Irregular heart beat   . Shortness of breath   . Headache(784.0)   . Hepatitis   . Alcohol withdrawal ([place])   . Neuropathy ([place]) 06/07/2013  . 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 Seizure due to alcohol withdrawal ([place]) 01/23/2014    Per patient report  . Anxiety Dx 2014  . GERD (gastroesophageal reflux disease) Dx 2003  . Hyperlipidemia Dx 2000  . Hypertension Dx 2011  . Withdrawal seizures ([place]) 2014  . COPD (chronic obstructive pulmonary disease) [facility])    Past Surgical History  Procedure Laterality Date  . Back surgery  1995, 1999  . Neck surgery  2000  . Fundoplasty transthoracic  2003  . Nasal sinus surgery    . Left heart  catheterization with coronary angiogram N/A 01/13/2014    Procedure: LEFT HEART CATHETERIZATION WITH CORONARY ANGIOGRAM;  Surgeon: Clent Demark, MD;  Location: [facility] CATH LAB;  Service: Cardiovascular;  Laterality: N/A;  . Subacromial decompression Right 11/16/2014    Procedure: RIGHT SHOULDER ARTHROSCOPY WITH SUBACROMIAL DECOMPRESSION ;  Surgeon: Leandrew Koyanagi, MD;  Location: [place];  Service: Orthopedics;  Laterality: Right;  . Nissen fundoplication    . 24 hour ph study N/A 01/16/2015    Procedure: 24 HOUR PH STUDY;  Surgeon: Manus Gunning, MD;  Location: [facility] ENDOSCOPY;  Service: Gastroenterology;  Laterality: N/A;   Family History  Problem Relation Age of Onset  . Breast cancer Paternal Grandmother   . Lung cancer Father     was a smoker  . Hypertension Mother   . Colon polyps Mother    Social History  Substance Use Topics  . Smoking status: Current Every Day Smoker -- 0.50 packs/day for 30 years    Types: Cigarettes  . Smokeless tobacco: Never Used     Comment: 11-30-2014 info given  . Alcohol Use: 0.0 oz/week    0 Standard drinks or equivalent per week     Comment: occ    Review of Systems  Constitutional: Negative for fever.  Respiratory: Positive for cough.   Genitourinary: Negative for dysuria and hematuria.  Musculoskeletal: Positive for  back pain.       + Rib pain   Skin: Negative for rash.    Allergies  Lisinopril and Cozaar  Home Medications   Prior to Admission medications   Medication Sig Start Date End Date Taking? Authorizing Provider  acetaminophen-codeine (TYLENOL #3) 300-30 MG tablet Take 1-2 tablets by mouth every 6 (six) hours as needed for moderate pain. 05/04/15   Josalyn Funches, MD  albuterol (PROVENTIL HFA;VENTOLIN HFA) 108 (90 BASE) MCG/ACT inhaler Inhale 2 puffs into the lungs every 4 (four) hours as needed for wheezing or shortness of breath. 07/28/14   Josalyn Funches, MD  cyclobenzaprine (FLEXERIL) 10 MG tablet Take 1  tablet (10 mg total) by mouth 2 (two) times daily as needed for muscle spasms. 06/22/14   Gerda Diss, DO  dicyclomine (BENTYL) 20 MG tablet Take 1 tablet (20 mg total) by mouth 3 (three) times daily as needed for spasms. 02/28/15   Lori P Hvozdovic, PA-C  gabapentin (NEURONTIN) 400 MG capsule Take 2 capsules (800 mg total) by mouth 3 (three) times daily. For substance withdrawal syndrome/paina management 02/13/15   Boykin Nearing, MD  lansoprazole (PREVACID) 15 MG capsule Take 15 mg by mouth 2 (two) times daily before a meal.     Historical Provider, MD  metoprolol (LOPRESSOR) 50 MG tablet Take 1 tablet (50 mg total) by mouth 2 (two) times daily. 02/13/15   Boykin Nearing, MD  Misc. Devices (ADJUSTABLE ALUMINUM CANE) MISC 1 each by Does not apply route daily. 04/03/15   Josalyn Funches, MD  QUEtiapine (SEROQUEL) 100 MG tablet Take 75 mg by mouth at bedtime. Reported on 04/03/2015 11/17/14   Historical Provider, MD  tamsulosin (FLOMAX) 0.4 MG CAPS capsule Take 0.8 mg by mouth daily after supper.    Historical Provider, MD  traZODone (DESYREL) 50 MG tablet Take 0.5-1 tablets (25-50 mg total) by mouth at bedtime as needed for sleep. 04/03/15   Josalyn Funches, MD   BP 136/100 mmHg  Pulse 69  Temp(Src) 97.9 F (36.6 C) (Oral)  Resp 18  SpO2 100% Physical Exam  Constitutional: He is oriented to person, place, and time. He appears well-developed and well-nourished. No distress.  HENT:  Head: Normocephalic and atraumatic.  Eyes: Conjunctivae are normal.  Cardiovascular: Normal rate, regular rhythm and normal heart sounds.   Pulmonary/Chest: Effort normal and breath sounds normal. No respiratory distress.  Abdominal: Soft. He exhibits no distension. There is no tenderness.  Musculoskeletal:  Tenderness of T-spine at T10-T12 region. Thoracic paraspinal muscle tenderness;  no overlying skin changes, no crepitus or step off   Neurological: He is alert and oriented to person, place, and time.  Skin:  Skin is warm and dry.  Psychiatric: He has a normal mood and affect.  Nursing note and vitals reviewed.   ED Course  Procedures   DIAGNOSTIC STUDIES:  Oxygen Saturation is 100% on RA, normal by my interpretation.    COORDINATION OF CARE:  1:25 PM Will order UA, CXR, and pain meds. Discussed treatment plan with pt at bedside and pt agreed to plan.  Imaging Review Dg Chest 2 View  05/25/2015  CLINICAL DATA:  Low back pain.  Right-sided rib pain. EXAM: CHEST  2 VIEW COMPARISON:  February 27, 2015 FINDINGS: The heart size and mediastinal contours are within normal limits. Both lungs are clear. The visualized skeletal structures are unremarkable. IMPRESSION: No active cardiopulmonary disease. Electronically Signed   By: Dorise Bullion III M.D   On: 05/25/2015 13:40  I have personally reviewed and evaluated these images and lab results as part of my medical decision-making.   MDM  BP 136/100 mmHg  Pulse 69  Temp(Src) 97.9 F (36.6 C) (Oral)  Resp 18  SpO2 100%  Patient with back pain.  No neurological deficits and normal neuro exam.  Patient is ambulatory.  No loss of bowel or bladder control.  No concern for cauda equina.  No fever, night sweats, weight loss, h/o cancer, IVDA, no recent procedure to back. No urinary symptoms suggestive of UTI.  Supportive care and return precautions discussed. Will discharge with orthopedic referral. Pt appears safe for discharge at this time. Pt was prescribed 240 tylenol #3 by her PCP in January.  He also has Flexeril at home to use.  I encourage pt to take her medications for his pain.    Final diagnoses:  Mid back pain   I personally performed the services described in this documentation, which was scribed in my presence. The recorded information has been reviewed and is accurate.        Domenic Moras, PA-C 05/25/15 [place], MD 05/26/15 0730

## 2015-05-25 NOTE — Telephone Encounter (Signed)
Pt. Called requesting a med refill on Tylenol # 3. Pt. Stated he went to the ED and was told to double the dosage and pt. Stated he would run out of the medication this weekend. Please f/u with pt.

## 2015-05-29 MED ORDER — ACETAMINOPHEN-CODEINE #3 300-30 MG PO TABS: 1.0000 | ORAL_TABLET | Freq: Four times a day (QID) | ORAL | Status: DC | PRN

## 2015-05-29 NOTE — Telephone Encounter (Signed)
Patient is already taking a high dose of tylenol #3 Please take it as I have written it.  Refill is ready for pick up  I have placed another pain management referral

## 2015-05-29 NOTE — Telephone Encounter (Signed)
Pt. Called requesting a med refill on Tylenol # 3. Pt. Stated he went to the ED and was told to double the dosage and pt. Stated he would run out of the medication this weekend. Please f/u with pt.

## 2015-05-30 NOTE — Telephone Encounter (Signed)
Pt was aware

## 2015-05-31 MED FILL — ACETAMINOPHEN/COD #3 TABLET: 300-30 | 30 days supply | Qty: 240 | Fill #0

## 2015-06-15 MED FILL — METOPROLOL TARTRATE 50 MG T: 50 | 30 days supply | Qty: 60 | Fill #11

## 2015-06-21 MED FILL — GABAPENTIN 400 MG CAPSULE: 400 | 30 days supply | Qty: 180 | Fill #4

## 2015-06-27 ENCOUNTER — Telehealth: Payer: Self-pay | Admitting: Family Medicine

## 2015-06-27 NOTE — Telephone Encounter (Signed)
Pt. Called requesting a refill on Tylenol # 3. Please f/u °

## 2015-06-28 ENCOUNTER — Other Ambulatory Visit: Payer: Self-pay | Admitting: Family Medicine

## 2015-06-28 DIAGNOSIS — G894 Chronic pain syndrome: Secondary | ICD-10-CM

## 2015-06-28 NOTE — Telephone Encounter (Signed)
Tylenol #3 refilled and ready for pick up   

## 2015-06-29 MED FILL — ACETAMINOPHEN/COD #3 TABLET: 300-30 | 30 days supply | Qty: 240 | Fill #0

## 2015-06-29 NOTE — Telephone Encounter (Signed)
Unable to contact pt, no voice mail  Rx at front office ready to be pick up

## 2015-06-29 NOTE — Telephone Encounter (Signed)
Tylenol #3 refilled and ready for pick up   

## 2015-06-30 MED FILL — traZODone HCL 50 MG TABS: 50 | 30 days supply | Qty: 30 | Fill #1

## 2015-07-05 ENCOUNTER — Ambulatory Visit: Payer: No Typology Code available for payment source | Attending: Family Medicine

## 2015-07-14 MED FILL — ?METOPROLOL 50 MG TABLET: 50 | 30 days supply | Qty: 60 | Fill #0

## 2015-07-24 ENCOUNTER — Encounter: Payer: Self-pay | Admitting: Family Medicine

## 2015-07-24 ENCOUNTER — Ambulatory Visit: Payer: No Typology Code available for payment source | Attending: Family Medicine | Admitting: Family Medicine

## 2015-07-24 VITALS — BP 138/94 | HR 76 | Temp 99.0°F | Resp 16 | Ht 76.0 in | Wt 216.0 lb

## 2015-07-24 DIAGNOSIS — G894 Chronic pain syndrome: Secondary | ICD-10-CM

## 2015-07-24 DIAGNOSIS — G629 Polyneuropathy, unspecified: Secondary | ICD-10-CM

## 2015-07-24 DIAGNOSIS — N400 Enlarged prostate without lower urinary tract symptoms: Secondary | ICD-10-CM

## 2015-07-24 DIAGNOSIS — Z79899 Other long term (current) drug therapy: Secondary | ICD-10-CM | POA: Insufficient documentation

## 2015-07-24 DIAGNOSIS — K219 Gastro-esophageal reflux disease without esophagitis: Secondary | ICD-10-CM | POA: Insufficient documentation

## 2015-07-24 DIAGNOSIS — F3130 Bipolar disorder, current episode depressed, mild or moderate severity, unspecified: Secondary | ICD-10-CM

## 2015-07-24 DIAGNOSIS — I10 Essential (primary) hypertension: Secondary | ICD-10-CM

## 2015-07-24 DIAGNOSIS — F17210 Nicotine dependence, cigarettes, uncomplicated: Secondary | ICD-10-CM | POA: Insufficient documentation

## 2015-07-24 DIAGNOSIS — N529 Male erectile dysfunction, unspecified: Secondary | ICD-10-CM

## 2015-07-24 DIAGNOSIS — F319 Bipolar disorder, unspecified: Secondary | ICD-10-CM | POA: Insufficient documentation

## 2015-07-24 MED ORDER — TRAZODONE HCL 50 MG PO TABS: 25.0000 mg | ORAL_TABLET | Freq: Every evening | ORAL | Status: DC | PRN

## 2015-07-24 MED ORDER — TADALAFIL 20 MG PO TABS: 10.0000 mg | ORAL_TABLET | ORAL | Status: DC | PRN

## 2015-07-24 MED ORDER — ACETAMINOPHEN-CODEINE #3 300-30 MG PO TABS: 1.0000 | ORAL_TABLET | Freq: Four times a day (QID) | ORAL | Status: DC | PRN

## 2015-07-24 MED ORDER — TAMSULOSIN HCL 0.4 MG PO CAPS: 0.8000 mg | ORAL_CAPSULE | Freq: Every day | ORAL | Status: DC

## 2015-07-24 MED ORDER — LANSOPRAZOLE 15 MG PO CPDR: 15.0000 mg | DELAYED_RELEASE_CAPSULE | Freq: Two times a day (BID) | ORAL | Status: DC

## 2015-07-24 MED ORDER — TRAMADOL HCL 50 MG PO TABS
50.0000 mg | ORAL_TABLET | Freq: Three times a day (TID) | ORAL | Status: DC | PRN
Start: 2015-07-24 — End: 2015-08-22

## 2015-07-24 MED ORDER — CYCLOBENZAPRINE HCL 10 MG PO TABS: 10.0000 mg | ORAL_TABLET | Freq: Every evening | ORAL | Status: DC | PRN

## 2015-07-24 MED ORDER — METOPROLOL TARTRATE 50 MG PO TABS: 50.0000 mg | ORAL_TABLET | Freq: Two times a day (BID) | ORAL | Status: DC

## 2015-07-24 MED ORDER — GABAPENTIN 400 MG PO CAPS: 800.0000 mg | ORAL_CAPSULE | Freq: Three times a day (TID) | ORAL | Status: DC

## 2015-07-24 MED FILL — ACETAMINOPHEN/COD #3 TABLET: 300-30 | 30 days supply | Qty: 240 | Fill #0

## 2015-07-24 MED FILL — ?CYCLOBENZAPRINE 10 MG TABL: 10 | 30 days supply | Qty: 30 | Fill #0

## 2015-07-24 MED FILL — ?TAMSULOSIN HCL 0.4 MG CAP: 0.4 | 30 days supply | Qty: 60 | Fill #0

## 2015-07-24 MED FILL — GABAPENTIN 400 MG CAPSULE: 400 | 30 days supply | Qty: 180 | Fill #5

## 2015-07-24 MED FILL — traZODone HCL 50 MG TABS: 50 | 30 days supply | Qty: 30 | Fill #0

## 2015-07-24 NOTE — Progress Notes (Signed)
F/U HTN neuropathy  Medication refills  Pains scale #8 Tobacco user 10 cigarette per day  No suicidal thoughts in the past two weeks

## 2015-07-24 NOTE — Assessment & Plan Note (Signed)
Chronic pain with neuropathy with worsening pains  In feet.  Plan: Continue tylenol #3 Add tramadol 50 mg BID for breakthrough pain He signed controlled substance contract today.  Pain management referral

## 2015-07-24 NOTE — Patient Instructions (Addendum)
Diagnoses and all orders for this visit:  Chronic pain syndrome -     Ambulatory referral to Pain Clinic -     traMADol (ULTRAM) 50 MG tablet; Take 1 tablet (50 mg total) by mouth every 8 (eight) hours as needed. -     cyclobenzaprine (FLEXERIL) 10 MG tablet; Take 1 tablet (10 mg total) by mouth at bedtime as needed for muscle spasms. -     acetaminophen-codeine (TYLENOL #3) 300-30 MG tablet; Take 1-2 tablets by mouth every 6 (six) hours as needed for moderate pain.  Bipolar depression ([place]) -     traZODone (DESYREL) 50 MG tablet; Take 0.5-1 tablets (25-50 mg total) by mouth at bedtime as needed for sleep.  Neuropathy ([name]) -     gabapentin (NEURONTIN) 400 MG capsule; Take 2 capsules (800 mg total) by mouth 3 (three) times daily. For substance withdrawal syndrome/paina management -     cyclobenzaprine (FLEXERIL) 10 MG tablet; Take 1 tablet (10 mg total) by mouth at bedtime as needed for muscle spasms.  Essential hypertension -     metoprolol (LOPRESSOR) 50 MG tablet; Take 1 tablet (50 mg total) by mouth 2 (two) times daily.  Gastroesophageal reflux disease, esophagitis presence not specified -     lansoprazole (PREVACID) 15 MG capsule; Take 1 capsule (15 mg total) by mouth 2 (two) times daily before a meal.  BPH (benign prostatic hyperplasia) -     tamsulosin (FLOMAX) 0.4 MG CAPS capsule; Take 2 capsules (0.8 mg total) by mouth daily after supper.  Erectile dysfunction, unspecified erectile dysfunction type -     tadalafil (CIALIS) 20 MG tablet; Take 0.5-1 tablets (10-20 mg total) by mouth every other day as needed for erectile dysfunction.   Call for refills of tramadol and tylenol #3  F/u in 3 months for chronic pain Dr. Adrian Blackwater

## 2015-07-24 NOTE — Progress Notes (Signed)
Patient ID: Mark [name], male   DOB: 10-Nov-1965, 50 y.o.   MRN: [id number]   Subjective:  Patient ID: Mark [name], male    DOB: 26-Aug-1965  Age: 50 y.o. MRN: [id number]  CC: No chief complaint on file.   HPI Mark [name] presents for   1. 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 Has had pain since mid 90s. First back surgery was in 1994/1995. Pain became debilitating in 2000. He was previously on oxycodone and methadone. Pain is low back, radiates down posterior thighs. He has pain in feet also. Pain with standing. Numbness in feet. Deep aches exacerbated by standing, walking or sitting in one position. He continues to smoke. Gabapentin helps. He has tried Lyrica which did not help more than the tramadol.  Tylenol #3 helps. Overall numbness and pains in feet are worsening. He is still interested in pursuing pain management. He is requesting additional medication for pain.  He is uninsured. He is unemployed. He is applying for medical disability.   2. ED: not taking flomax for BPH. Has tried cialis in the past for ED. Has trouble getting an erection other than occasionally waking up with an erection. He currently is not in a sexual relationship.   Social History  Substance Use Topics  . Smoking status: Current Every Day Smoker -- 0.50 packs/day for 30 years    Types: Cigarettes  . Smokeless tobacco: Never Used     Comment: 11-30-2014 info given  . Alcohol Use: 0.0 oz/week    0 Standard drinks or equivalent per week     Comment: occ   Past Surgical History  Procedure Laterality Date  . Back surgery  1995, 1999  . Neck surgery  2000  . Fundoplasty transthoracic  2003  . Nasal sinus surgery    . Left heart catheterization with coronary angiogram N/A 01/13/2014    Procedure: LEFT HEART CATHETERIZATION WITH CORONARY ANGIOGRAM;  Surgeon: Clent Demark, MD;  Location: [facility] CATH LAB;  Service: Cardiovascular;  Laterality: N/A;  . Subacromial decompression  Right 11/16/2014    Procedure: RIGHT SHOULDER ARTHROSCOPY WITH SUBACROMIAL DECOMPRESSION ;  Surgeon: Leandrew Koyanagi, MD;  Location: [place];  Service: Orthopedics;  Laterality: Right;  . Nissen fundoplication    . 24 hour ph study N/A 01/16/2015    Procedure: 24 HOUR PH STUDY;  Surgeon: Manus Gunning, MD;  Location: [facility];  Service: Gastroenterology;  Laterality: N/A;     Outpatient Prescriptions Prior to Visit  Medication Sig Dispense Refill  . acetaminophen-codeine (TYLENOL #3) 300-30 MG tablet TAKE 1-2 TABLETS BY MOUTH EVERY 6 HOURS AS NEEDED FOR MODERATE PAIN 240 tablet 0  . albuterol (PROVENTIL HFA;VENTOLIN HFA) 108 (90 BASE) MCG/ACT inhaler Inhale 2 puffs into the lungs every 4 (four) hours as needed for wheezing or shortness of breath. 6.7 g 2  . cyclobenzaprine (FLEXERIL) 10 MG tablet Take 1 tablet (10 mg total) by mouth 2 (two) times daily as needed for muscle spasms. 60 tablet 0  . dicyclomine (BENTYL) 20 MG tablet Take 1 tablet (20 mg total) by mouth 3 (three) times daily as needed for spasms. 90 tablet 1  . gabapentin (NEURONTIN) 400 MG capsule Take 2 capsules (800 mg total) by mouth 3 (three) times daily. For substance withdrawal syndrome/paina management 180 capsule 5  . lansoprazole (PREVACID) 15 MG capsule Take 15 mg by mouth 2 (two) times daily before a meal.     .  metoprolol (LOPRESSOR) 50 MG tablet Take 1 tablet (50 mg total) by mouth 2 (two) times daily. 60 tablet 11  . Misc. Devices (ADJUSTABLE ALUMINUM CANE) MISC 1 each by Does not apply route daily. 1 each 0  . QUEtiapine (SEROQUEL) 100 MG tablet Take 75 mg by mouth at bedtime. Reported on 04/03/2015  3  . tamsulosin (FLOMAX) 0.4 MG CAPS capsule Take 0.8 mg by mouth daily after supper.    . traZODone (DESYREL) 50 MG tablet Take 0.5-1 tablets (25-50 mg total) by mouth at bedtime as needed for sleep. 30 tablet 1   No facility-administered medications prior to visit.    ROS Review of Systems    Constitutional: Negative for fever, chills, fatigue and unexpected weight change.  Eyes: Negative for visual disturbance.  Respiratory: Negative for cough and shortness of breath.   Cardiovascular: Negative for chest pain, palpitations and leg swelling.  Gastrointestinal: Negative for nausea, vomiting, abdominal pain, diarrhea, constipation and blood in stool.  Endocrine: Negative for polydipsia, polyphagia and polyuria.  Musculoskeletal: Positive for back pain, arthralgias and gait problem. Negative for myalgias and neck pain.  Skin: Negative for rash.  Allergic/Immunologic: Negative for immunocompromised state.  Neurological: Positive for numbness (in R hand and feet ).  Hematological: Negative for adenopathy. Does not bruise/bleed easily.  Psychiatric/Behavioral: Positive for sleep disturbance and decreased concentration. Negative for suicidal ideas and dysphoric mood. The patient is nervous/anxious.     Objective:  BP 138/94 mmHg  Pulse 76  Temp(Src) 99 F (37.2 C) (Oral)  Resp 16  Ht 6\' 4"  (1.93 m)  Wt 216 lb (97.977 kg)  BMI 26.30 kg/m2  SpO2 97%  BP/Weight 07/24/2015 05/25/2015 [id number]  Systolic BP 0000000 [id number]  Diastolic BP 94 89 75  Wt. (Lbs) 216 - 220  BMI 26.3 - 27.35    Physical Exam  Constitutional: He appears well-developed and well-nourished. No distress.  HENT:  Head: Normocephalic and atraumatic.  Neck: Normal range of motion. Neck supple.  Cardiovascular: Normal rate, regular rhythm, normal heart sounds and intact distal pulses.   Pulmonary/Chest: Effort normal and breath sounds normal.  Musculoskeletal: He exhibits tenderness. He exhibits no edema.       Lumbar back: He exhibits decreased range of motion, tenderness, pain and spasm. He exhibits no bony tenderness, no swelling, no edema, no deformity, no laceration and normal pulse.  Tenderness both plantar feet without lesion or deformity 2+ DP pulses   Neurological: He is alert. Gait abnormal.  Skin:  Skin is warm and dry. No rash noted. No erythema.  Psychiatric: He has a normal mood and affect.   Lab Results  Component Value Date   HGBA1C 6.0 04/03/2015   Depression screen [facility] 2/9 04/03/2015 02/13/2015 11/03/2014  Decreased Interest 2 3 3   Down, Depressed, Hopeless 2 2 2   PHQ - 2 Score 4 5 5   Altered sleeping 2 3 2   Tired, decreased energy 3 2 3   Change in appetite 2 2 2   Feeling bad or failure about yourself  1 1 0  Trouble concentrating 2 2 2   Moving slowly or fidgety/restless 0 0 1  Suicidal thoughts 0 0 0  PHQ-9 Score 14 15 15     GAD 7 : Generalized Anxiety Score 04/03/2015 02/13/2015  Nervous, Anxious, on Edge 2 3  Control/stop worrying 1 2  Worry too much - different things 2 2  Trouble relaxing 2 3  Restless 1 3  Easily annoyed or irritable 1 3  Afraid - awful  might happen 0 0  Total GAD 7 Score 9 16    Assessment & Plan:   Problem List Items Addressed This Visit    Neuropathy ([name]) (Chronic)   Relevant Medications   gabapentin (NEURONTIN) 400 MG capsule   cyclobenzaprine (FLEXERIL) 10 MG tablet   Hypertension (Chronic)   Relevant Medications   metoprolol (LOPRESSOR) 50 MG tablet   tadalafil (CIALIS) 20 MG tablet   GERD (gastroesophageal reflux disease) (Chronic)   Relevant Medications   lansoprazole (PREVACID) 15 MG capsule   Erectile dysfunction   Relevant Medications   tadalafil (CIALIS) 20 MG tablet   Chronic pain syndrome - Primary (Chronic)   Relevant Medications   traMADol (ULTRAM) 50 MG tablet   cyclobenzaprine (FLEXERIL) 10 MG tablet   acetaminophen-codeine (TYLENOL #3) 300-30 MG tablet   Other Relevant Orders   Ambulatory referral to Pain Clinic   Bipolar depression ([place]) (Chronic)   Relevant Medications   traZODone (DESYREL) 50 MG tablet    Other Visit Diagnoses    BPH (benign prostatic hyperplasia)        Relevant Medications    tamsulosin (FLOMAX) 0.4 MG CAPS capsule       No orders of the defined types were placed in this  encounter.    Follow-up: No Follow-up on file.   Boykin Nearing MD

## 2015-07-25 ENCOUNTER — Other Ambulatory Visit: Payer: Self-pay

## 2015-07-25 MED ORDER — DICYCLOMINE HCL 20 MG PO TABS
20.0000 mg | ORAL_TABLET | Freq: Three times a day (TID) | ORAL | Status: DC | PRN
Start: 2015-07-25 — End: 2016-05-23

## 2015-07-25 MED FILL — traMADol HCL 50 MG TABS: 50 | 20 days supply | Qty: 60 | Fill #0

## 2015-07-25 MED FILL — ?DICYCLOMINE 20 MG TABLET: 20 | 30 days supply | Qty: 90 | Fill #0

## 2015-07-25 MED FILL — !CIALIS 20 MG TABLET: 20 | 10 days supply | Qty: 5 | Fill #0

## 2015-07-28 ENCOUNTER — Other Ambulatory Visit: Payer: Self-pay | Admitting: Family Medicine

## 2015-08-02 MED FILL — !CIALIS 20 MG TABLET: 20 | 10 days supply | Qty: 5 | Fill #1

## 2015-08-08 MED FILL — ?METOPROLOL 50 MG TABLET: 50 | 30 days supply | Qty: 60 | Fill #1

## 2015-08-09 ENCOUNTER — Encounter (HOSPITAL_COMMUNITY): Payer: Self-pay | Admitting: Psychiatry

## 2015-08-09 ENCOUNTER — Ambulatory Visit (INDEPENDENT_AMBULATORY_CARE_PROVIDER_SITE_OTHER): Payer: No Typology Code available for payment source | Admitting: Psychiatry

## 2015-08-09 VITALS — BP 140/84 | HR 55 | Ht 76.0 in | Wt 216.0 lb

## 2015-08-09 DIAGNOSIS — F29 Unspecified psychosis not due to a substance or known physiological condition: Secondary | ICD-10-CM

## 2015-08-09 DIAGNOSIS — F319 Bipolar disorder, unspecified: Secondary | ICD-10-CM

## 2015-08-09 DIAGNOSIS — F419 Anxiety disorder, unspecified: Secondary | ICD-10-CM

## 2015-08-09 DIAGNOSIS — F1010 Alcohol abuse, uncomplicated: Secondary | ICD-10-CM

## 2015-08-09 MED ORDER — QUETIAPINE FUMARATE 25 MG PO TABS: ORAL_TABLET | ORAL | Status: DC

## 2015-08-09 MED ORDER — DULOXETINE HCL 30 MG PO CPEP: 30.0000 mg | ORAL_CAPSULE | Freq: Every day | ORAL | Status: DC

## 2015-08-09 MED FILL — QUETIAPINE FUMARATE 25 MG T: 25 | 30 days supply | Qty: 120 | Fill #0

## 2015-08-09 MED FILL — ?METOPROLOL 50 MG TABLET: 50 | 30 days supply | Qty: 60 | Fill #2

## 2015-08-09 MED FILL — DULoxetine HCL 30 MG CPEP: 30 | 30 days supply | Qty: 30 | Fill #0
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
## 2015-08-21 ENCOUNTER — Telehealth: Payer: Self-pay | Admitting: Family Medicine

## 2015-08-21 DIAGNOSIS — G894 Chronic pain syndrome: Secondary | ICD-10-CM

## 2015-08-21 NOTE — Telephone Encounter (Signed)
Patient called requesting medication refill on acetaminophen-codeine (TYLENOL #3) 300-30 MG tablet and traMADol (ULTRAM) 50 MG tablet. Please f/up

## 2015-08-22 MED ORDER — TRAMADOL HCL 50 MG PO TABS: 50.0000 mg | ORAL_TABLET | Freq: Three times a day (TID) | ORAL | Status: DC | PRN

## 2015-08-22 MED ORDER — ACETAMINOPHEN-CODEINE #3 300-30 MG PO TABS: 1.0000 | ORAL_TABLET | Freq: Four times a day (QID) | ORAL | Status: DC | PRN

## 2015-08-22 MED FILL — GABAPENTIN 400 MG CAPSULE: 400 | 30 days supply | Qty: 180 | Fill #0

## 2015-08-22 NOTE — Telephone Encounter (Signed)
Tylenol #3 and tramadol ready for pick up

## 2015-08-22 NOTE — Telephone Encounter (Signed)
Pt. Called requesting a refill on Tylenol # 3 and Tramadol. Please f/u

## 2015-08-22 NOTE — Telephone Encounter (Signed)
Patient third time calling, checking for refill request for tylenol 3 and tramadol

## 2015-08-22 NOTE — Telephone Encounter (Signed)
I have forwarded the request to Dr. Adrian Blackwater. Please make patient aware that it can take up to 48 hours to handle refill request and that controls must be approved by the physician.

## 2015-08-23 MED FILL — ACETAMINOPHEN/COD #3 TABLET: 300-30 | 30 days supply | Qty: 240 | Fill #0

## 2015-08-23 MED FILL — traMADol HCL 50 MG TABS: 50 | 20 days supply | Qty: 60 | Fill #0

## 2015-08-30 ENCOUNTER — Encounter (HOSPITAL_COMMUNITY): Payer: Self-pay | Admitting: Psychiatry

## 2015-08-30 ENCOUNTER — Ambulatory Visit (INDEPENDENT_AMBULATORY_CARE_PROVIDER_SITE_OTHER): Payer: No Typology Code available for payment source | Admitting: Psychiatry

## 2015-08-30 VITALS — BP 118/68 | HR 58 | Ht 76.0 in | Wt 215.2 lb

## 2015-08-30 DIAGNOSIS — F1010 Alcohol abuse, uncomplicated: Secondary | ICD-10-CM

## 2015-08-30 DIAGNOSIS — F319 Bipolar disorder, unspecified: Secondary | ICD-10-CM

## 2015-08-30 MED ORDER — LAMOTRIGINE 25 MG PO TABS
ORAL_TABLET | ORAL | Status: DC
Start: 2015-08-30 — End: 2015-11-07

## 2015-08-30 MED FILL — lamoTRIgine 25 MG TABS: 25 | 30 days supply | Qty: 60 | Fill #0

## 2015-08-30 NOTE — Progress Notes (Signed)
[facility] [phone] Progress Note  Mark [name] [id number] 50 y.o.  08/30/2015 11:09 AM  Chief Complaint:  I cannot take Cymbalta.  It was making me more anxious and irritable.  I cannot tolerate Seroquel during the day.  It is making me very sleepy.  History of Present Illness:  Mark [name] came for his follow-up appointment.  He is a 50 year old Caucasian divorced unemployed man who was seen first time on May 31 for initial evaluation.  He was referred from his primary care physician.  He is getting diagnoses of bipolar disorder.  We started him on Seroquel which he took in the past and low dose Cymbalta.  However he did not tolerate very well Cymbalta and he noticed more irritability, anxiety and nervousness and after one week he stopped.  He used to take a higher dose of Seroquel but this time he could not tolerate more than 25 mg.  He feel very groggy next day and he is not taking any Seroquel in the morning.  He continues to feel depressed, anxious, irritable and having crying spells.  He continued to endorse hallucination and some time paranoia.  He admitted hearing a child and a male voice but do not understand what they're talking about.  He endorsed lack of energy, crying spells, withdrawn and isolated.  He admitted having racing thoughts.  He like to try Seroquel because it did help him in the past.  He feels proud that he's been not drinking heavily and his last heavy drink was 7 months ago.  Patient has 2 adult daughter who lives with his ex-wife.  Patient has good [place] Silos relationship with his ex-wife.  Patient is scheduled to see Legrand Pitts for counseling on June 29.  His appetite is okay.  His vital signs are stable.  He has no tremors shakes or any EPS.  Suicidal Ideation: No Plan Formed: No Patient has means to carry out plan: No  Homicidal Ideation: No Plan Formed: No Patient has means to carry out plan: No  Past Psychiatric History/Hospitalization(s): Patient endorse  history of severe mood swing, anger issues, mania, impulsive behavior during his childhood and growing up.  He endorse hypersexual, excessive buying and excessive shopping when he was adult.  He started drinking heavily and he has history of one psychiatric inpatient treatment at behavioral [place] in 2015 because of alcohol and pain medication abuse.  He denies any history of suicidal attempt but admitted history of suicidal thoughts, paranoia, hallucination and depression.  He had tried Remeron, trazodone, Effexor, Prozac, Paxil with limited response.  Recently we tried Cymbalta but it makes him more anxious and irritable.  He was [facility] [place].  He also tried [facility] but did not like the service there. Anxiety: Yes Bipolar Disorder: Yes Depression: Yes Mania: Yes Psychosis: Yes Schizophrenia: No Personality Disorder: No Hospitalization for psychiatric illness: Yes History of Electroconvulsive Shock Therapy: No Prior Suicide Attempts: No  Family History; Patient reported father and grandfather has history of heavy drinking.  He also mentioned one of his uncle has schizophrenia.  Medical History; Patient has multiple health issues.  He had multiple back surgery in 90s and subsequent he developed chronic back pain.  He has history of neck surgery and shoulder surgery.  His primary care physician is Dr. Oswaldo Milian.  He has history of seizures related to his alcohol use.  He has neuropathy, hypertension and hyperlipidemia.  Review of Systems: Psychiatric: Agitation: Irritability Hallucination: Here male and  a child voice but does not understand what they're talking Depressed Mood: Yes Insomnia: Yes Hypersomnia: No Altered Concentration: No Feels Worthless: Yes Grandiose Ideas: No Belief In Special Powers: No New/Increased Substance Abuse: No Compulsions: No  Neurologic: Headache: No Seizure: History of seizures due to alcohol related Paresthesias:  Numbness and neuropathy in his feet   Outpatient Encounter Prescriptions as of 08/30/2015  Medication Sig  . acetaminophen-codeine (TYLENOL #3) 300-30 MG tablet Take 1-2 tablets by mouth every 6 (six) hours as needed for moderate pain.  Marland Kitchen albuterol (PROVENTIL HFA;VENTOLIN HFA) 108 (90 BASE) MCG/ACT inhaler Inhale 2 puffs into the lungs every 4 (four) hours as needed for wheezing or shortness of breath.  . cyclobenzaprine (FLEXERIL) 10 MG tablet Take 1 tablet (10 mg total) by mouth at bedtime as needed for muscle spasms.  Marland Kitchen dicyclomine (BENTYL) 20 MG tablet Take 1 tablet (20 mg total) by mouth 3 (three) times daily as needed for spasms.  Marland Kitchen gabapentin (NEURONTIN) 400 MG capsule Take 2 capsules (800 mg total) by mouth 3 (three) times daily. For substance withdrawal syndrome/paina management  . lamoTRIgine (LAMICTAL) 25 MG tablet Take 1 tab daily for 1 week and than 2 tab daily  . lansoprazole (PREVACID) 15 MG capsule Take 1 capsule (15 mg total) by mouth 2 (two) times daily before a meal.  . metoprolol (LOPRESSOR) 50 MG tablet Take 1 tablet (50 mg total) by mouth 2 (two) times daily.  . Misc. Devices (ADJUSTABLE ALUMINUM CANE) MISC 1 each by Does not apply route daily.  . QUEtiapine (SEROQUEL) 25 MG tablet One tab twice daily and 2 at bed time (Patient taking differently: 25 mg at bedtime. )  . tadalafil (CIALIS) 20 MG tablet Take 0.5-1 tablets (10-20 mg total) by mouth every other day as needed for erectile dysfunction.  . tamsulosin (FLOMAX) 0.4 MG CAPS capsule Take 2 capsules (0.8 mg total) by mouth daily after supper.  . traMADol (ULTRAM) 50 MG tablet Take 1 tablet (50 mg total) by mouth every 8 (eight) hours as needed.  . [DISCONTINUED] DULoxetine (CYMBALTA) 30 MG capsule Take 1 capsule (30 mg total) by mouth daily.   No facility-administered encounter medications on file as of 08/30/2015.    No results found for this or any previous visit (from the past 2160 hour(s)).    Constitutional:   BP 118/68 mmHg  Pulse 58  Ht 6\' 4"  (1.93 m)  Wt 215 lb 3.2 oz (97.614 kg)  BMI 26.21 kg/m2   Musculoskeletal: Strength & Muscle Tone: decreased Gait & Station: normal Patient leans: N/A  Psychiatric Specialty Exam: General Appearance: Fairly Groomed and Guarded  Engineer, water::  Fair  Speech:  Slow  Volume:  Decreased  Mood:  Anxious and Depressed  Affect:  Constricted and Depressed  Thought Process:  Descriptions of Associations: Intact  Orientation:  Full (Time, Place, and Person)  Thought Content:  Hallucinations: Auditory  Suicidal Thoughts:  No  Homicidal Thoughts:  No  Memory:  Immediate;   Fair Recent;   Fair Remote;   Fair  Judgement:  Fair  Insight:  Fair  Psychomotor Activity:  Decreased  Concentration:  Fair  Recall:  [employer] of Knowledge:  Fair  Language:  Good  Akathisia:  No  Handed:  Right  AIMS (if indicated):     Assets:  Communication Skills Desire for Improvement  ADL's:  Intact  Cognition:  WNL  Sleep:        Established Problem, Stable/Improving (1), Review of  Psycho-Social Stressors (1), Review and summation of old records (2), Established Problem, Worsening (2), Review of Last Therapy Session (1), Review of Medication Regimen & Side Effects (2) and Review of New Medication or Change in Dosage (2)  Assessment: Axis I: Bipolar disorder, depressed type.  Bipolar disorder with psychosis.  Alcohol abuse.  Anxiety disorder NOS  Axis II: Deferred  Axis III:  Past Medical History  Diagnosis Date  . 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 ETOH abuse   . Mental disorder   . Irregular heart beat   . Shortness of breath   . Headache(784.0)   . Hepatitis   . Alcohol withdrawal ([place])   . Neuropathy ([place]) 06/07/2013  . 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 Seizure due to alcohol withdrawal ([place]) 01/23/2014    Per patient report  . Anxiety Dx 2014  . GERD (gastroesophageal reflux disease) Dx 2003  .  Hyperlipidemia Dx 2000  . Hypertension Dx 2011  . Withdrawal seizures ([place]) 2014  . COPD (chronic obstructive pulmonary disease) ([place])      Plan:  I will discontinue Cymbalta as it did not help him and makes him more anxious and nervous.  We will try Lamictal 25 mg daily for 1 week and then 50 mg daily.  He has never tried before.  Discussed medication side effects especially it can cause rash and in that case he needed to stop the medication immediately.  I encouraged him to continue Seroquel since it did help him in the past.  Recommended to take 25 mg only at bedtime and gradually increase to 50 mg at bedtime.  Recommended not to take any Seroquel during the daytime.  Patient is scheduled to see Lean Yates for counseling on June 29. I recommended to call us back if he has any question or any concern.  Discuss safety plan that anytime having active suicidal thoughts or homicidal thoughts but he need to call 911 or go to the local emergency room.  I will see him again in 4 weeks.  Eilah Common T., MD 08/30/2015

## 2015-08-31 MED FILL — ?METOPROLOL 50 MG TABLET: 50 | 30 days supply | Qty: 60 | Fill #3

## 2015-09-01 MED FILL — !CIALIS 20 MG TABLET: 20 | 10 days supply | Qty: 5 | Fill #2

## 2015-09-07 ENCOUNTER — Ambulatory Visit (HOSPITAL_COMMUNITY): Payer: Self-pay | Admitting: Psychology

## 2015-09-07 ENCOUNTER — Encounter (HOSPITAL_COMMUNITY): Payer: Self-pay | Admitting: Psychology

## 2015-09-07 NOTE — Progress Notes (Signed)
Mark [name] is a 50 y.o. male patient who didn't show for his appointment.  Letter sent.        Jan Fireman, LPC

## 2015-09-18 ENCOUNTER — Other Ambulatory Visit: Payer: Self-pay | Admitting: Family Medicine

## 2015-09-18 DIAGNOSIS — M25511 Pain in right shoulder: Secondary | ICD-10-CM

## 2015-09-18 DIAGNOSIS — G8929 Other chronic pain: Secondary | ICD-10-CM

## 2015-09-18 DIAGNOSIS — M5416 Radiculopathy, lumbar region: Principal | ICD-10-CM

## 2015-09-18 DIAGNOSIS — G894 Chronic pain syndrome: Secondary | ICD-10-CM

## 2015-09-21 ENCOUNTER — Telehealth: Payer: Self-pay | Admitting: Family Medicine

## 2015-09-21 NOTE — Telephone Encounter (Signed)
MA unable to leave a voice message due to phone continuously ringing.  !!!Please inform patient of medication being placed at the front desk for pickup!!!

## 2015-09-21 NOTE — Telephone Encounter (Signed)
Will forward to pcp

## 2015-09-21 NOTE — Telephone Encounter (Signed)
Tylenol #3 and tramadol ready for pick up with 2 refills on each Please inform patient

## 2015-09-21 NOTE — Telephone Encounter (Signed)
Patient called need tylenol 3 and tramadol

## 2015-09-22 MED FILL — ?METOPROLOL 50 MG TABLET: 50 | 30 days supply | Qty: 60 | Fill #4

## 2015-09-22 MED FILL — GABAPENTIN 400 MG CAPSULE: 400 | 30 days supply | Qty: 180 | Fill #1

## 2015-09-22 MED FILL — traZODone HCL 50 MG TABS: 50 | 30 days supply | Qty: 30 | Fill #1

## 2015-09-22 MED FILL — ACETAMINOPHEN/COD #3 TABLET: 300-30 | 30 days supply | Qty: 240 | Fill #0

## 2015-09-22 MED FILL — traMADol HCL 50 MG TABS: 50 | 20 days supply | Qty: 60 | Fill #0

## 2015-10-05 ENCOUNTER — Ambulatory Visit (HOSPITAL_COMMUNITY): Payer: Self-pay | Admitting: Psychiatry

## 2015-10-17 ENCOUNTER — Telehealth: Payer: Self-pay | Admitting: Family Medicine

## 2015-10-17 MED FILL — METOPROLOL TARTRATE 50 MG T: 50 | 30 days supply | Qty: 60 | Fill #5

## 2015-10-17 NOTE — Telephone Encounter (Signed)
Patient to contact pharmacy for refills, 2 Refills were placed on last Rx

## 2015-10-17 NOTE — Telephone Encounter (Signed)
Pt called requesting medication refill on acetaminophen-codeine (TYLENOL #3) 300-30 MG tablet and traMADol (ULTRAM) 50 MG tablet Pt states he is due saturday but would like to fill on Friday if possible

## 2015-10-19 MED FILL — traMADol HCL 50 MG TABS: 50 | 20 days supply | Qty: 60 | Fill #1

## 2015-10-19 MED FILL — ACETAMINOPHEN/COD #3 TABLET: 300-30 | 30 days supply | Qty: 240 | Fill #1

## 2015-10-23 MED FILL — GABAPENTIN 400 MG CAPSULE: 400 | 30 days supply | Qty: 180 | Fill #2

## 2015-10-26 ENCOUNTER — Other Ambulatory Visit: Payer: Self-pay

## 2015-10-26 DIAGNOSIS — N529 Male erectile dysfunction, unspecified: Secondary | ICD-10-CM

## 2015-10-26 MED ORDER — TADALAFIL 20 MG PO TABS: 10.0000 mg | ORAL_TABLET | ORAL | 3 refills | Status: DC | PRN

## 2015-10-26 NOTE — Telephone Encounter (Signed)
Printed prescription for pass(cialis)

## 2015-11-07 ENCOUNTER — Inpatient Hospital Stay (HOSPITAL_COMMUNITY)
Admission: EM | Admit: 2015-11-07 | Discharge: 2015-11-10 | DRG: 308 | Disposition: A | Discharge status: Home / Self Care | Payer: Self-pay | Attending: Family Medicine | Admitting: Family Medicine

## 2015-11-07 ENCOUNTER — Encounter (HOSPITAL_COMMUNITY): Payer: Self-pay | Admitting: Emergency Medicine

## 2015-11-07 ENCOUNTER — Observation Stay (HOSPITAL_COMMUNITY): Payer: Self-pay

## 2015-11-07 ENCOUNTER — Emergency Department (HOSPITAL_COMMUNITY): Payer: Self-pay

## 2015-11-07 DIAGNOSIS — F3132 Bipolar disorder, current episode depressed, moderate: Secondary | ICD-10-CM

## 2015-11-07 DIAGNOSIS — I2510 Atherosclerotic heart disease of native coronary artery without angina pectoris: Secondary | ICD-10-CM | POA: Diagnosis present

## 2015-11-07 DIAGNOSIS — F419 Anxiety disorder, unspecified: Secondary | ICD-10-CM | POA: Diagnosis present

## 2015-11-07 DIAGNOSIS — F3130 Bipolar disorder, current episode depressed, mild or moderate severity, unspecified: Secondary | ICD-10-CM

## 2015-11-07 DIAGNOSIS — E871 Hypo-osmolality and hyponatremia: Secondary | ICD-10-CM | POA: Diagnosis present

## 2015-11-07 DIAGNOSIS — Y908 Blood alcohol level of 240 mg/100 ml or more: Secondary | ICD-10-CM | POA: Diagnosis present

## 2015-11-07 DIAGNOSIS — E785 Hyperlipidemia, unspecified: Secondary | ICD-10-CM | POA: Diagnosis present

## 2015-11-07 DIAGNOSIS — I10 Essential (primary) hypertension: Secondary | ICD-10-CM | POA: Diagnosis present

## 2015-11-07 DIAGNOSIS — F10120 Alcohol abuse with intoxication, uncomplicated: Secondary | ICD-10-CM

## 2015-11-07 DIAGNOSIS — Z79899 Other long term (current) drug therapy: Secondary | ICD-10-CM

## 2015-11-07 DIAGNOSIS — F319 Bipolar disorder, unspecified: Secondary | ICD-10-CM | POA: Diagnosis present

## 2015-11-07 DIAGNOSIS — F17200 Nicotine dependence, unspecified, uncomplicated: Secondary | ICD-10-CM | POA: Diagnosis present

## 2015-11-07 DIAGNOSIS — G629 Polyneuropathy, unspecified: Secondary | ICD-10-CM | POA: Diagnosis present

## 2015-11-07 DIAGNOSIS — F10929 Alcohol use, unspecified with intoxication, unspecified: Secondary | ICD-10-CM | POA: Diagnosis present

## 2015-11-07 DIAGNOSIS — R45851 Suicidal ideations: Secondary | ICD-10-CM | POA: Diagnosis present

## 2015-11-07 DIAGNOSIS — G894 Chronic pain syndrome: Secondary | ICD-10-CM | POA: Diagnosis present

## 2015-11-07 DIAGNOSIS — F1120 Opioid dependence, uncomplicated: Secondary | ICD-10-CM | POA: Diagnosis present

## 2015-11-07 DIAGNOSIS — D72829 Elevated white blood cell count, unspecified: Secondary | ICD-10-CM | POA: Diagnosis present

## 2015-11-07 DIAGNOSIS — F1010 Alcohol abuse, uncomplicated: Secondary | ICD-10-CM

## 2015-11-07 DIAGNOSIS — I472 Ventricular tachycardia: Principal | ICD-10-CM | POA: Diagnosis present

## 2015-11-07 DIAGNOSIS — Z781 Physical restraint status: Secondary | ICD-10-CM

## 2015-11-07 DIAGNOSIS — F17210 Nicotine dependence, cigarettes, uncomplicated: Secondary | ICD-10-CM | POA: Diagnosis present

## 2015-11-07 DIAGNOSIS — G9340 Encephalopathy, unspecified: Secondary | ICD-10-CM | POA: Diagnosis not present

## 2015-11-07 DIAGNOSIS — Z915 Personal history of self-harm: Secondary | ICD-10-CM

## 2015-11-07 DIAGNOSIS — F10239 Alcohol dependence with withdrawal, unspecified: Secondary | ICD-10-CM | POA: Diagnosis not present

## 2015-11-07 DIAGNOSIS — I4729 Other ventricular tachycardia: Secondary | ICD-10-CM

## 2015-11-07 DIAGNOSIS — Z811 Family history of alcohol abuse and dependence: Secondary | ICD-10-CM

## 2015-11-07 DIAGNOSIS — J449 Chronic obstructive pulmonary disease, unspecified: Secondary | ICD-10-CM | POA: Diagnosis present

## 2015-11-07 DIAGNOSIS — E876 Hypokalemia: Secondary | ICD-10-CM | POA: Diagnosis present

## 2015-11-07 DIAGNOSIS — Z720 Tobacco use: Secondary | ICD-10-CM

## 2015-11-07 DIAGNOSIS — Z8249 Family history of ischemic heart disease and other diseases of the circulatory system: Secondary | ICD-10-CM

## 2015-11-07 LAB — BASIC METABOLIC PANEL
ANION GAP: 14 (ref 5–15)
ANION GAP: 11 (ref 5–15)
BUN: <5 mg/dL — ABNORMAL LOW (ref 6–20)
BUN: <5 mg/dL — ABNORMAL LOW (ref 6–20)
CHLORIDE: 95 mmol/L — AB (ref 101–111)
CHLORIDE: 101 mmol/L (ref 101–111)
CO2: 23 mmol/L (ref 22–32)
CO2: 24 mmol/L (ref 22–32)
Calcium: 9.4 mg/dL (ref 8.9–10.3)
Calcium: 9 mg/dL (ref 8.9–10.3)
Creatinine, Ser: 0.7 mg/dL (ref 0.61–1.24)
Creatinine, Ser: 0.71 mg/dL (ref 0.61–1.24)
GFR calc Af Amer: >60 mL/min (ref >60)
GFR calc non Af Amer: >60 mL/min (ref >60)
GLUCOSE: 111 mg/dL — AB (ref 65–99)
Glucose, Bld: 104 mg/dL — ABNORMAL HIGH (ref 65–99)
POTASSIUM: 3.3 mmol/L — AB (ref 3.5–5.1)
POTASSIUM: 3.8 mmol/L (ref 3.5–5.1)
SODIUM: 136 mmol/L (ref 135–145)
Sodium: 132 mmol/L — ABNORMAL LOW (ref 135–145)

## 2015-11-07 LAB — MAGNESIUM
MAGNESIUM: 2.4 mg/dL (ref 1.7–2.4)
MAGNESIUM: 2.5 mg/dL — AB (ref 1.7–2.4)

## 2015-11-07 LAB — CBC
HEMATOCRIT: 50.1 % (ref 39.0–52.0)
HEMATOCRIT: 46.1 % (ref 39.0–52.0)
HEMOGLOBIN: 18.5 g/dL — AB (ref 13.0–17.0)
HEMOGLOBIN: 16.5 g/dL (ref 13.0–17.0)
MCH: 29.6 pg (ref 26.0–34.0)
MCH: 28.7 pg (ref 26.0–34.0)
MCHC: 36.9 g/dL — AB (ref 30.0–36.0)
MCHC: 35.8 g/dL (ref 30.0–36.0)
MCV: 80 fL (ref 78.0–100.0)
MCV: 80.2 fL (ref 78.0–100.0)
PLATELETS: 352 10*3/uL (ref 150–400)
Platelets: 372 10*3/uL (ref 150–400)
RBC: 6.26 MIL/uL — ABNORMAL HIGH (ref 4.22–5.81)
RBC: 5.75 MIL/uL (ref 4.22–5.81)
RDW: 12.8 % (ref 11.5–15.5)
RDW: 12.8 % (ref 11.5–15.5)
WBC: 12.1 10*3/uL — ABNORMAL HIGH (ref 4.0–10.5)
WBC: 6.3 10*3/uL (ref 4.0–10.5)

## 2015-11-07 LAB — SODIUM, URINE, RANDOM

## 2015-11-07 LAB — ACETAMINOPHEN LEVEL

## 2015-11-07 LAB — TROPONIN I

## 2015-11-07 LAB — I-STAT TROPONIN, ED: Troponin i, poc: 0 ng/mL (ref 0.00–0.08)

## 2015-11-07 LAB — SALICYLATE LEVEL: Salicylate Lvl: <4 mg/dL (ref 2.8–30.0)

## 2015-11-07 LAB — ETHANOL: Alcohol, Ethyl (B): 268 mg/dL — ABNORMAL HIGH (ref <5)

## 2015-11-07 LAB — OSMOLALITY: Osmolality: 322 mOsm/kg (ref 275–295)

## 2015-11-07 LAB — ECHOCARDIOGRAM COMPLETE
Height: 76 in
Weight: 3488 oz

## 2015-11-07 LAB — OSMOLALITY, URINE: Osmolality, Ur: 178 mOsm/kg — ABNORMAL LOW (ref 300–900)

## 2015-11-07 MED ORDER — LORAZEPAM 1 MG PO TABS
1.0000 mg | ORAL_TABLET | Freq: Four times a day (QID) | ORAL | Status: DC | PRN
  Administered 2015-11-07: 1 mg via ORAL
  Filled 2015-11-07: qty 1

## 2015-11-07 MED ORDER — GABAPENTIN 400 MG PO CAPS
800.0000 mg | ORAL_CAPSULE | Freq: Three times a day (TID) | ORAL | Status: DC
  Administered 2015-11-07 (×3): 800 mg via ORAL
  Filled 2015-11-07 (×3): qty 2

## 2015-11-07 MED ORDER — LORAZEPAM 2 MG/ML IJ SOLN
0.0000 mg | Freq: Four times a day (QID) | INTRAMUSCULAR | Status: AC
  Administered 2015-11-07 – 2015-11-08 (×3): 4 mg via INTRAVENOUS
  Administered 2015-11-08 – 2015-11-09 (×2): 2 mg via INTRAVENOUS
  Administered 2015-11-09: 1 mg via INTRAVENOUS
  Filled 2015-11-07: qty 2
  Filled 2015-11-07: qty 1
  Filled 2015-11-07 (×2): qty 2
  Filled 2015-11-07 (×2): qty 1

## 2015-11-07 MED ORDER — VITAMIN B-1 100 MG PO TABS
100.0000 mg | ORAL_TABLET | Freq: Every day | ORAL | Status: DC
  Administered 2015-11-08 – 2015-11-10 (×3): 100 mg via ORAL
  Filled 2015-11-07 (×3): qty 1

## 2015-11-07 MED ORDER — VITAMIN B-1 100 MG PO TABS
100.0000 mg | ORAL_TABLET | Freq: Every day | ORAL | Status: DC
  Administered 2015-11-07: 100 mg via ORAL
  Filled 2015-11-07: qty 1

## 2015-11-07 MED ORDER — LORAZEPAM 2 MG/ML IJ SOLN
INTRAMUSCULAR | Status: AC
  Filled 2015-11-07: qty 1

## 2015-11-07 MED ORDER — THIAMINE HCL 100 MG/ML IJ SOLN: 100.0000 mg | Freq: Every day | INTRAMUSCULAR | Status: DC

## 2015-11-07 MED ORDER — AMIODARONE IV BOLUS ONLY 150 MG/100ML
150.0000 mg | Freq: Once | INTRAVENOUS | Status: DC
  Filled 2015-11-07: qty 100

## 2015-11-07 MED ORDER — LORAZEPAM 2 MG/ML IJ SOLN: 1.0000 mg | Freq: Four times a day (QID) | INTRAMUSCULAR | Status: DC | PRN

## 2015-11-07 MED ORDER — ACETAMINOPHEN 325 MG PO TABS
650.0000 mg | ORAL_TABLET | Freq: Four times a day (QID) | ORAL | Status: DC | PRN
  Administered 2015-11-09: 650 mg via ORAL
  Filled 2015-11-07: qty 2

## 2015-11-07 MED ORDER — LORAZEPAM 2 MG/ML IJ SOLN
2.0000 mg | Freq: Once | INTRAMUSCULAR | Status: AC
  Administered 2015-11-07: 2 mg via INTRAVENOUS

## 2015-11-07 MED ORDER — LORAZEPAM 2 MG/ML IJ SOLN
0.0000 mg | Freq: Two times a day (BID) | INTRAMUSCULAR | Status: DC
  Administered 2015-11-09: 1 mg via INTRAVENOUS
  Administered 2015-11-10: 2 mg via INTRAVENOUS
  Filled 2015-11-07 (×2): qty 1

## 2015-11-07 MED ORDER — LORAZEPAM 2 MG/ML IJ SOLN
1.0000 mg | Freq: Four times a day (QID) | INTRAMUSCULAR | Status: DC | PRN
  Administered 2015-11-07 – 2015-11-08 (×2): 1 mg via INTRAVENOUS
  Filled 2015-11-07 (×2): qty 1

## 2015-11-07 MED ORDER — FOLIC ACID 1 MG PO TABS: 1.0000 mg | ORAL_TABLET | Freq: Every day | ORAL | Status: DC

## 2015-11-07 MED ORDER — POTASSIUM CHLORIDE CRYS ER 20 MEQ PO TBCR
40.0000 meq | EXTENDED_RELEASE_TABLET | Freq: Once | ORAL | Status: AC
  Administered 2015-11-07: 40 meq via ORAL
  Filled 2015-11-07: qty 2

## 2015-11-07 MED ORDER — POTASSIUM CHLORIDE 10 MEQ/100ML IV SOLN
10.0000 meq | INTRAVENOUS | Status: AC
  Administered 2015-11-07 (×2): 10 meq via INTRAVENOUS
  Filled 2015-11-07 (×2): qty 100

## 2015-11-07 MED ORDER — THIAMINE HCL 100 MG/ML IJ SOLN
100.0000 mg | Freq: Every day | INTRAMUSCULAR | Status: DC
  Filled 2015-11-07: qty 2

## 2015-11-07 MED ORDER — LORAZEPAM 2 MG/ML IJ SOLN
2.0000 mg | INTRAMUSCULAR | Status: AC
  Administered 2015-11-07: 2 mg via INTRAVENOUS

## 2015-11-07 MED ORDER — LORAZEPAM 1 MG PO TABS
0.0000 mg | ORAL_TABLET | Freq: Four times a day (QID) | ORAL | Status: DC
  Administered 2015-11-07: 4 mg via ORAL
  Administered 2015-11-07: 1 mg via ORAL
  Filled 2015-11-07: qty 4
  Filled 2015-11-07: qty 1

## 2015-11-07 MED ORDER — ACETAMINOPHEN-CODEINE #3 300-30 MG PO TABS
2.0000 | ORAL_TABLET | ORAL | Status: DC | PRN
  Administered 2015-11-07 (×4): 2 via ORAL
  Filled 2015-11-07 (×4): qty 2

## 2015-11-07 MED ORDER — SODIUM CHLORIDE 0.9% FLUSH
3.0000 mL | Freq: Two times a day (BID) | INTRAVENOUS | Status: DC
  Administered 2015-11-07 – 2015-11-10 (×6): 3 mL via INTRAVENOUS

## 2015-11-07 MED ORDER — ACETAMINOPHEN 650 MG RE SUPP: 650.0000 mg | Freq: Four times a day (QID) | RECTAL | Status: DC | PRN

## 2015-11-07 MED ORDER — FOLIC ACID 1 MG PO TABS
1.0000 mg | ORAL_TABLET | Freq: Every day | ORAL | Status: DC
  Administered 2015-11-07: 1 mg via ORAL
  Filled 2015-11-07: qty 1

## 2015-11-07 MED ORDER — CYCLOBENZAPRINE HCL 10 MG PO TABS
10.0000 mg | ORAL_TABLET | Freq: Every evening | ORAL | Status: DC | PRN
  Administered 2015-11-07: 10 mg via ORAL
  Filled 2015-11-07: qty 1

## 2015-11-07 MED ORDER — LORAZEPAM 1 MG PO TABS: 1.0000 mg | ORAL_TABLET | Freq: Four times a day (QID) | ORAL | Status: DC | PRN

## 2015-11-07 MED ORDER — METOPROLOL TARTRATE 50 MG PO TABS
50.0000 mg | ORAL_TABLET | Freq: Two times a day (BID) | ORAL | Status: DC
  Administered 2015-11-07 – 2015-11-10 (×7): 50 mg via ORAL
  Filled 2015-11-07: qty 1
  Filled 2015-11-07 (×2): qty 2
  Filled 2015-11-07 (×3): qty 1
  Filled 2015-11-07: qty 2

## 2015-11-07 MED ORDER — AMIODARONE HCL IN DEXTROSE 360-4.14 MG/200ML-% IV SOLN
60.0000 mg/h | INTRAVENOUS | Status: DC
  Filled 2015-11-07: qty 200

## 2015-11-07 MED ORDER — AMIODARONE HCL 150 MG/3ML IV SOLN: 60.0000 mg/h | Freq: Once | INTRAVENOUS | Status: DC

## 2015-11-07 MED ORDER — QUETIAPINE FUMARATE 50 MG PO TABS
25.0000 mg | ORAL_TABLET | Freq: Every evening | ORAL | Status: DC | PRN
  Administered 2015-11-07: 25 mg via ORAL
  Filled 2015-11-07: qty 1

## 2015-11-07 MED ORDER — GI COCKTAIL ~~LOC~~
30.0000 mL | Freq: Once | ORAL | Status: AC
  Administered 2015-11-07: 30 mL via ORAL
  Filled 2015-11-07: qty 30

## 2015-11-07 MED ORDER — THIAMINE HCL 100 MG/ML IJ SOLN
Freq: Once | INTRAVENOUS | Status: AC
  Administered 2015-11-07: 05:00:00 via INTRAVENOUS
  Filled 2015-11-07: qty 1000

## 2015-11-07 MED ORDER — NAPROXEN SODIUM 275 MG PO TABS
275.0000 mg | ORAL_TABLET | Freq: Two times a day (BID) | ORAL | Status: DC | PRN
  Administered 2015-11-07: 275 mg via ORAL
  Filled 2015-11-07 (×2): qty 1

## 2015-11-07 MED ORDER — ENOXAPARIN SODIUM 40 MG/0.4ML ~~LOC~~ SOLN
40.0000 mg | SUBCUTANEOUS | Status: DC
  Administered 2015-11-07 – 2015-11-10 (×4): 40 mg via SUBCUTANEOUS
  Filled 2015-11-07 (×4): qty 0.4

## 2015-11-07 MED ORDER — ADULT MULTIVITAMIN W/MINERALS CH
1.0000 | ORAL_TABLET | Freq: Every day | ORAL | Status: DC
  Administered 2015-11-08 – 2015-11-10 (×3): 1 via ORAL
  Filled 2015-11-07 (×3): qty 1

## 2015-11-07 MED ORDER — ADULT MULTIVITAMIN W/MINERALS CH
1.0000 | ORAL_TABLET | Freq: Every day | ORAL | Status: DC
  Administered 2015-11-07: 1 via ORAL
  Filled 2015-11-07: qty 1

## 2015-11-07 MED ORDER — LORAZEPAM BOLUS VIA INFUSION: 2.0000 mg | INTRAVENOUS | Status: DC

## 2015-11-07 MED ORDER — LORAZEPAM 1 MG PO TABS: 0.0000 mg | ORAL_TABLET | Freq: Two times a day (BID) | ORAL | Status: DC

## 2015-11-07 MED ORDER — TRAMADOL HCL 50 MG PO TABS
50.0000 mg | ORAL_TABLET | Freq: Two times a day (BID) | ORAL | Status: DC | PRN
  Administered 2015-11-07 – 2015-11-10 (×6): 50 mg via ORAL
  Filled 2015-11-07 (×6): qty 1

## 2015-11-07 MED ORDER — FAMOTIDINE 20 MG PO TABS
20.0000 mg | ORAL_TABLET | Freq: Once | ORAL | Status: AC
  Administered 2015-11-07: 20 mg via ORAL
  Filled 2015-11-07: qty 1

## 2015-11-07 NOTE — Progress Notes (Signed)
[facility]  Mark [name] is a 50 y.o. 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  I have reviewed the chart and examined the patient.  Subjective: no complaints of chest pain currently. Is quite depressed. No sure if he is suicidal at this time.   Principal Problem:   Suicidal ideation/  Bipolar depression  -not very forthcoming about suicidal intentions-  I would continue Sitter at bedside- have contacted psych  Active Problems:   Nonsustained ventricular tachycardia - cont Metoprolol- have contacted Dr Zenia Resides office for consult request - f/u ECHO- normal EF on last ECHO - ensure electrolyte are stable    Hypokalemia - replaced  Hyponatremia - improved with banana bag    Essential hypertension - Lopressor    Tobacco abuse .- counseled     Alcohol intoxication - states he does not drink on a daily basis but had drank a 12 pack of beer on the day of admission - CIWA scale- no signs of withdrawal as of yet    Chronic pain syndrome/   Neuropathy - Neurontin, Ultram  Debbe Odea, MD

## 2015-11-07 NOTE — ED Provider Notes (Addendum)
[facility] DEPT Provider Note   CSN: [id number] Arrival date & time: 11/07/15  0123  By signing my name below, I, Reola Mosher, attest that this documentation has been prepared under the direction and in the presence of Denessa Cavan, MD. Electronically Signed: Reola Mosher, ED Scribe. 11/07/15. 2:48 AM.  History   Chief Complaint Chief Complaint  Patient presents with  . Chest Pain  . Suicidal   The history is provided by the patient. No language interpreter was used.  Chest Pain   This is a recurrent problem. The current episode started more than 1 week ago. The problem occurs constantly. The problem has not changed since onset.The pain is present in the substernal region. The pain is moderate. The pain does not radiate. Pertinent negatives include no fever, no nausea, no palpitations, no shortness of breath and no vomiting. He has tried nothing for the symptoms. The treatment provided no relief. Risk factors include alcohol intake, smoking/tobacco exposure, stress and male gender.  His past medical history is significant for COPD, hyperlipidemia and hypertension.  Pertinent negatives for past medical history include no PE.   HPI Comments: Mark [name] is a 50 y.o. male with a PMhx of alcohol withdrawal, Bipolar disorder, substance abuse, COPD, anxiety, GERD, EtOH abuse, Long Q-T syndrome, HLD, HTN, and opioid dependence, who presents to the Emergency Department complaining of sudden onset, unchanged, constant mid-sternal chest pain onset several months ago. He describes his pain as feeling like a tank is sitting on his chest. His chest pain is sometimes exacerbated with drinking. No other exacerbating factors. No alleviating factors or treatments tried PTA. No other associated symptoms. He denies fever.   Pt is also c/o of SI. He has no specific plan, however he states that "my options are open". He reports associated auditory hallucinations, but notes that he cannot  make out what they are saying to him. Pt reports that he has been on a ~5 day drinking binge prior to coming into the ED. Pt has been hospitalized in the past of his hx of Bipolar disorder, but does not remember the last time he was hospitalized.  He notes that he drank 6-7 beers PTA, which is atypical of him. No other illicit drug usage. Denies HI.   Past Medical History:  Diagnosis Date  . Alcohol withdrawal ([place])   . Anxiety Dx 2014  . COPD (chronic obstructive pulmonary disease) ([place])   . Drug abuse    pt reports opioid dependence due to previous back surgeries  . ETOH abuse   . GERD (gastroesophageal reflux disease) Dx 2003  . Headache(784.0)   . Hepatitis   . Hyperlipidemia Dx 2000  . Hypertension Dx 2011  . Irregular heart beat   . 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 Mental disorder   . Neuropathy ([place]) 06/07/2013  . Seizure due to alcohol withdrawal ([place]) 01/23/2014   Per patient report  . Shortness of breath   . Withdrawal seizures [facility]) 2014   Patient Active Problem List   Diagnosis Date Noted  . GERD (gastroesophageal reflux disease) 07/24/2015  . Chronic anxiety 12/19/2014  . 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 Erectile dysfunction 09/16/2014  . Poor dentition 07/28/2014  . Chronic maxillary sinusitis 07/28/2014  . Vitamin D insufficiency 07/05/2014  . Closed fracture of 5th metacarpal 07/04/2014  . Chronic fatigue 07/04/2014  . Chronic radicular low back pain 06/02/2014  . Pain in joint, shoulder region 05/12/2014  . Bipolar depression ([place]) 02/08/2014  . Neuropathy (  [place]) 02/08/2014  . 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 Opioid dependence ([place]) 01/22/2014    Class: Acute  . Paroxysmal VT ([place]) 12/19/2013  . Chronic pain syndrome 12/28/2012  . 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 Tobacco abuse 01/19/2012  . Hypertension   . Hyperlipidemia    Past Surgical History:  Procedure Laterality Date  . [place] STUDY N/A 01/16/2015   Procedure: [place] STUDY;  Surgeon: Manus Gunning, MD;  Location: [facility] ENDOS[facility];  Service: Gastroenterology;  Laterality: N/A;  . [employer]  . FUNDOPLASTY TRANSTHORACIC  2003  . LEFT HEART CATHETERIZATION WITH CORONARY ANGIOGRAM N/A 01/13/2014   Procedure: LEFT HEART CATHETERIZATION WITH CORONARY ANGIOGRAM;  Surgeon: Clent Demark, MD;  Location: [facility] CATH LAB;  Service: Cardiovascular;  Laterality: N/A;  . NASAL SINUS SURGERY    . NECK SURGERY  2000  . [place] FUNDOPLICATION    . SUBACROMIAL DECOMPRESSION Right 11/16/2014   Procedure: RIGHT SHOULDER ARTHROSCOPY WITH SUBACROMIAL DECOMPRESSION ;  Surgeon: Leandrew Koyanagi, MD;  Location: [facility];  Service: Orthopedics;  Laterality: Right;    Home Medications    Prior to Admission medications   Medication Sig Start Date End Date Taking? Authorizing Provider  acetaminophen-codeine (TYLENOL #3) 300-30 MG tablet TAKE 1-2 TABLETS BY MOUTH EVERY 6 HOURS AS NEEDED FOR MODERATE PAIN 09/21/15   Boykin Nearing, MD  albuterol (PROVENTIL HFA;VENTOLIN HFA) 108 (90 BASE) MCG/ACT inhaler Inhale 2 puffs into the lungs every 4 (four) hours as needed for wheezing or shortness of breath. 07/28/14   Josalyn Funches, MD  cyclobenzaprine (FLEXERIL) 10 MG tablet Take 1 tablet (10 mg total) by mouth at bedtime as needed for muscle spasms. 07/24/15   Josalyn Funches, MD  dicyclomine (BENTYL) 20 MG tablet Take 1 tablet (20 mg total) by mouth 3 (three) times daily as needed for spasms. 07/25/15   Manus Gunning, MD  gabapentin (NEURONTIN) 400 MG capsule Take 2 capsules (800 mg total) by mouth 3 (three) times daily. For substance withdrawal syndrome/paina management 07/24/15   Boykin Nearing, MD  lamoTRIgine (LAMICTAL) 25 MG tablet Take 1 tab daily for 1 week and than 2 tab daily 08/30/15   Kathlee Nations, MD  lansoprazole (PREVACID) 15 MG capsule Take 1 capsule (15 mg total) by mouth 2 (two) times daily before a meal. 07/24/15   Josalyn Funches, MD   metoprolol (LOPRESSOR) 50 MG tablet Take 1 tablet (50 mg total) by mouth 2 (two) times daily. 07/24/15   Boykin Nearing, MD  Misc. Devices (ADJUSTABLE ALUMINUM CANE) MISC 1 each by Does not apply route daily. 04/03/15   Josalyn Funches, MD  QUEtiapine (SEROQUEL) 25 MG tablet One tab twice daily and 2 at bed time Patient taking differently: 25 mg at bedtime.  08/09/15   Kathlee Nations, MD  tadalafil (CIALIS) 20 MG tablet Take 0.5-1 tablets (10-20 mg total) by mouth every other day as needed for erectile dysfunction. 10/26/15   Josalyn Funches, MD  tamsulosin (FLOMAX) 0.4 MG CAPS capsule Take 2 capsules (0.8 mg total) by mouth daily after supper. 07/24/15   Josalyn Funches, MD  traMADol (ULTRAM) 50 MG tablet TAKE 1 TABLET BY MOUTH EVERY 8 HOURS AS NEEDED 09/21/15   Boykin Nearing, MD   Family History Family History  Problem Relation Age of Onset  . Breast cancer Paternal Grandmother   . Lung cancer Father     was a smoker  . Alcohol  abuse Father   . Hypertension Mother   . Colon polyps Mother   . Alcohol abuse Paternal Grandfather    Social History Social History  Substance Use Topics  . Smoking status: Current Every Day Smoker    Packs/day: 0.50    Years: 30.00    Types: Cigarettes  . Smokeless tobacco: Never Used     Comment: 11-30-2014 info given  . Alcohol use 0.0 oz/week     Comment: occ   Allergies   Lisinopril and Cozaar [losartan]  Review of Systems Review of Systems  Constitutional: Negative for fever.  Respiratory: Negative for shortness of breath.   Cardiovascular: Positive for chest pain. Negative for palpitations and leg swelling.  Gastrointestinal: Negative for nausea and vomiting.  Musculoskeletal: Negative for neck pain.  Psychiatric/Behavioral: Positive for hallucinations (auditory) and suicidal ideas.  All other systems reviewed and are negative.  Physical Exam Updated Vital Signs BP 129/95 (BP Location: Left Arm)   Pulse 80   Temp 97.9 F (36.6 C) (Oral)    Resp 19   Ht 6\' 4"  (1.93 m)   Wt 218 lb (98.9 kg)   SpO2 100%   BMI 26.54 kg/m   Physical Exam  Constitutional: He appears well-developed and well-nourished.  HENT:  Head: Normocephalic.  Mouth/Throat: Oropharynx is clear and moist. No oropharyngeal exudate.  Eyes: Conjunctivae and EOM are normal. Pupils are equal, round, and reactive to light. Right eye exhibits no discharge. Left eye exhibits no discharge. No scleral icterus.  Neck: Normal range of motion. Neck supple. No JVD present. No tracheal deviation present.  Trachea is midline. No stridor or carotid bruits.  Cardiovascular: Normal rate, regular rhythm, normal heart sounds and intact distal pulses.   No murmur heard. Pulses:      Dorsalis pedis pulses are 2+ on the right side, and 2+ on the left side.  Pulmonary/Chest: Effort normal and breath sounds normal. No stridor. No respiratory distress. He has no wheezes. He has no rales.  Lungs CTA bilaterally.   Abdominal: Soft. Bowel sounds are normal. He exhibits no distension. There is no tenderness. There is no rebound and no guarding.  Lymphadenopathy:    He has no cervical adenopathy.  Neurological: He is alert. He has normal reflexes.  Skin: Skin is warm. Capillary refill takes less than 2 seconds.  Psychiatric: He has a normal mood and affect. He is not agitated. He expresses suicidal ideation. He expresses no suicidal plans.  Nursing note and vitals reviewed.  ED Treatments / Results  DIAGNOSTIC STUDIES: Oxygen Saturation is 100% on RA, normal by my interpretation.   COORDINATION OF CARE: 1:58 AM-Discussed next steps with pt. Pt verbalized understanding and is agreeable with the plan.   Labs (all labs ordered are listed, but only abnormal results are displayed) Labs Reviewed  BASIC METABOLIC PANEL  CBC  ETHANOL  SALICYLATE LEVEL  ACETAMINOPHEN LEVEL  URINE RAPID DRUG SCREEN, HOSP PERFORMED  I-STAT [place], ED   EKG  EKG  Interpretation  Date/Time:  Tuesday November 07 2015 01:41:24 EDT Ventricular Rate:  83 PR Interval:    QRS Duration: 110 QT Interval:  396 QTC Calculation: 466 R Axis:   59 Text Interpretation:  Sinus rhythm PVCs Confirmed by [facility]  MD, Fredric Slabach ([id number]) on 11/07/2015 1:58:17 AM      Radiology No results found.  Procedures Procedures (including critical care time)  Medications Ordered in ED Medications - No data to display  Initial Impression / Assessment and Plan / ED Course  I have reviewed the triage vital signs and the nursing notes.  Pertinent labs & imaging results that were available during my care of the patient were reviewed by me and considered in my medical decision making (see chart for details).  Clinical Course   Vitals:   11/07/15 0139  BP: 129/95  Pulse: 80  Resp: 19  Temp: 97.9 F (36.6 C)   Results for orders placed or performed during the hospital encounter of 11/07/15  Basic metabolic panel  Result Value Ref Range   Sodium 132 (L) 135 - 145 mmol/L   Potassium 3.3 (L) 3.5 - 5.1 mmol/L   Chloride 95 (L) 101 - 111 mmol/L   CO2 23 22 - 32 mmol/L   Glucose, Bld 111 (H) 65 - 99 mg/dL   BUN <5 (L) 6 - 20 mg/dL   Creatinine, Ser 7.840.70 0.61 - 1.24 mg/dL   Calcium 9.4 8.9 - 69.610.3 mg/dL   GFR calc non Af Amer >60 >60 mL/min   GFR calc Af Amer >60 >60 mL/min   Anion gap 14 5 - 15  CBC  Result Value Ref Range   WBC 12.1 (H) 4.0 - 10.5 K/uL   RBC 6.26 (H) 4.22 - 5.81 MIL/uL   Hemoglobin 18.5 (H) 13.0 - 17.0 g/dL   HCT 29.550.1 28.439.0 - 13.252.0 %   MCV 80.0 78.0 - 100.0 fL   MCH 29.6 26.0 - 34.0 pg   MCHC 36.9 (H) 30.0 - 36.0 g/dL   RDW 44.012.8 10.211.5 - 72.515.5 %   Platelets 372 150 - 400 K/uL  Ethanol  Result Value Ref Range   Alcohol, Ethyl (B) 268 (H) <5 mg/dL  Salicylate level  Result Value Ref Range   Salicylate Lvl <4.0 2.8 - 30.0 mg/dL  Acetaminophen level  Result Value Ref Range   Acetaminophen (Tylenol), Serum <10 (L) 10 - 30 ug/mL  I-stat troponin,  ED  Result Value Ref Range   Troponin i, poc 0.00 0.00 - 0.08 ng/mL   Comment 3           Dg Chest 2 View  Result Date: 11/07/2015 CLINICAL DATA:  Upper chest pain and dyspnea for several weeks, worse tonight. EXAM: CHEST  2 VIEW COMPARISON:  05/25/2015 FINDINGS: The lungs are clear. The pulmonary vasculature is normal. Heart size is normal. Hilar and mediastinal contours are unremarkable. There is no pleural effusion. IMPRESSION: No active cardiopulmonary disease. Electronically Signed   By: Ellery Plunkaniel R Mitchell M.D.   On: 11/07/2015 02:22      Final Clinical Impressions(s) / ED Diagnoses   Final diagnoses:  None    EKG Interpretation  Date/Time:  Tuesday November 07 2015 01:41:24 EDT Ventricular Rate:  83 PR Interval:    QRS Duration: 110 QT Interval:  396 QTC Calculation: 466 R Axis:   59 Text Interpretation:  Sinus rhythm PVCs Confirmed by [facility]  MD, Leilan Bochenek ([id number]) on 11/07/2015 1:58:17 AM      Medications  amiodarone (NEXTERONE) IV bolus only 150 mg/100 mL (not administered)  amiodarone (NEXTERONE PREMIX) 360-4.14 MG/200ML-% (1.8 mg/mL) IV infusion (not administered)  sodium chloride 0.9 % 1,000 mL with thiamine 100 mg, folic acid 1 mg, multivitamins adult 10 mL, magnesium sulfate infusion (not administered)  potassium chloride SA (K-DUR,KLOR-CON) CR tablet 40 mEq (not administered)  famotidine (PEPCID) tablet 20 mg (20 mg Oral Given 11/07/15 0302)  gi cocktail (Maalox,Lidocaine,Donnatal) (30 mLs Oral Given 11/07/15 0302)    New Prescriptions New Prescriptions   No medications on file  EKG and troponin are normal.  Patient then began having non sustained VT.  Will start banana bag with magnesium and amiodarone   Consult to cardiology placed, they have declined citing patient's suicidality.  Admit to medicine and they will consult.    Per Dr. Loleta Books, will see him        Sloka Volante, MD 11/07/15 [id number]    Veatrice Kells, MD 11/07/15 [phone]

## 2015-11-07 NOTE — Progress Notes (Signed)
CRITICAL VALUE ALERT  Critical value received:  Urine Osmolality 178  Date of notification:  11-07-15  Time of notification:  11:45  Critical value read back: yes  Nurse who received alert:  Henrietta Dine, RN  MD notified (1st page):  Dr. Wynelle Cleveland  Time of first page:  55  MD notified (2nd page):  Time of second page:  Responding MD:  Dr. Wynelle Cleveland  Time MD responded:

## 2015-11-07 NOTE — Progress Notes (Signed)
  Echocardiogram 2D Echocardiogram has been performed.  Donata Clay 11/07/2015, 1:38 PM

## 2015-11-07 NOTE — H&P (Signed)
History and Physical  Patient Name: Mark [name]     [id number]    DOB: Nov 30, 1965    DOA: 11/07/2015 PCP: Minerva Ends, MD  Cardiology: Dr. Terrence Dupont   Patient coming from: Home  Chief Complaint: Suicidality and widespread pain  HPI: Mark [name] is a 50 y.o. 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  The patient has been drinking again (60-120 oz beer daily) over the last week.  Over the last few days, he has felt suicidal again, without specific plan.  Tonight, he began to have onset of his chronic intermittent chest pain (chest pressure across the chest, radiating down arm, started at rest but worse with exertion).  He checked his pulse and had palpitations.  He then felt overwhelmed by his chronic foot, back, neck, and chest pain and so he came to the ER.  ED course: -Afebrile, heart rate 80s, respirations normal, BP 130/95, pulse oximetry normal -Na 132, K 3.3, Cr 0.7 (baseline), WBC 12.1K, Hgb 18.5, acetaminophen and salicylates negative, magnesium 2.5, troponin negative -Alcohol level 268 mg/dL -ECG and telemetry monitoring showed frequent PVCs as well as short runs of VT, longest ~25 beats, occurring every 3-5 minutes over 2 hours    Patient follows with Dr. Terrence Dupont, last saw him 1 week ago.  Has previous history of NICM, non-obstructive CAD on LHC in 2015, EF most recently back up to 50-55% in 2015.  Regarding Bipolar, he follows with Dr. Adele Schilder at [facility].  He reports to me two previous suicide attempts, one by drinking a "half gallon of liquor in an hour" and the other by "I almost slit my neck".  Neither time did he have to seek medical care.  Regarding chronic pain, this is in his feet from neuropathy (it is a numbness and also a "feels like my bones are broken" pain, severe in intensity, chronic, not  helped with his home Tylenol 3 or tramadol).        ROS: Review of Systems  Constitutional: Negative for chills, fever and malaise/fatigue.  Respiratory: Positive for shortness of breath (chronic). Negative for cough, hemoptysis, sputum production and wheezing.   Cardiovascular: Positive for chest pain (chronic) and palpitations. Negative for orthopnea, claudication, leg swelling and PND.  Musculoskeletal: Positive for back pain, joint pain and neck pain.       All chronic  Neurological: Negative for dizziness, tingling, tremors, sensory change, speech change, focal weakness, seizures and loss of consciousness.  Psychiatric/Behavioral: Positive for depression, memory loss, substance abuse and suicidal ideas. The patient is nervous/anxious.   All other systems reviewed and are negative.       Past Medical History:  Diagnosis Date  . Alcohol withdrawal ([place])   . Anxiety Dx 2014  . COPD (chronic obstructive pulmonary disease) ([place])   . Drug abuse    pt reports opioid dependence due to previous back surgeries  . ETOH abuse   . GERD (gastroesophageal reflux disease) Dx 2003  . Headache(784.0)   . Hepatitis   . Hyperlipidemia Dx 2000  . Hypertension Dx 2011  . Irregular heart beat   . 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 Mental disorder   . Neuropathy ([place]) 06/07/2013  . Seizure due to alcohol withdrawal ([place]) 01/23/2014   Per patient report  . Shortness of breath   . Withdrawal seizures ([place]) 2014  Past Surgical History:  Procedure Laterality Date  . [place] STUDY N/A 01/16/2015   Procedure:  STUDY;  Surgeon: Manus Gunning, MD;  Location: [facility];  Service: Gastroenterology;  Laterality: N/A;  . [place]  . FUNDOPLASTY TRANSTHORACIC  2003  . LEFT HEART CATHETERIZATION WITH CORONARY ANGIOGRAM N/A 01/13/2014   Procedure: LEFT HEART CATHETERIZATION WITH CORONARY ANGIOGRAM;  Surgeon: Clent Demark, MD;  Location: [place] CATH LAB;  Service:  Cardiovascular;  Laterality: N/A;  . NASAL SINUS SURGERY    . NECK SURGERY  2000  . [place] FUNDOPLICATION    . SUBACROMIAL DECOMPRESSION Right 11/16/2014   Procedure: RIGHT SHOULDER ARTHROSCOPY WITH SUBACROMIAL DECOMPRESSION ;  Surgeon: Leandrew Koyanagi, MD;  Location: [facility];  Service: Orthopedics;  Laterality: Right;    Social History: Patient lives his mother and stepfather.  The patient walks unassisted.  He smokes.  He drinks about 6-12 beers daily.  He is an unemployed Development worker, community, on disability for chronic pain.    Allergies  Allergen Reactions  . Cozaar [Losartan] Anaphylaxis  . Lisinopril Anaphylaxis    Family history: family history includes Alcohol abuse in his father and paternal grandfather; Breast cancer in his paternal grandmother; Colon polyps in his mother; Hypertension in his mother; Lung cancer in his father.  Prior to Admission medications   Medication Sig Start Date End Date Taking? Authorizing Provider  acetaminophen-codeine (TYLENOL #3) 300-30 MG tablet Take 2 tablets by mouth every 4 (four) hours as needed for moderate pain.   Yes Historical Provider, MD  cyclobenzaprine (FLEXERIL) 10 MG tablet Take 1 tablet (10 mg total) by mouth at bedtime as needed for muscle spasms. 07/24/15  Yes Josalyn Funches, MD  dicyclomine (BENTYL) 20 MG tablet Take 1 tablet (20 mg total) by mouth 3 (three) times daily as needed for spasms. 07/25/15  Yes Manus Gunning, MD  gabapentin (NEURONTIN) 800 MG tablet Take 800 mg by mouth 3 (three) times daily.   Yes Historical Provider, MD  metoprolol (LOPRESSOR) 50 MG tablet Take 1 tablet (50 mg total) by mouth 2 (two) times daily. 07/24/15  Yes Josalyn Funches, MD  QUEtiapine (SEROQUEL) 25 MG tablet Take 25 mg by mouth at bedtime as needed (for sleep).   Yes Historical Provider, MD  tadalafil (CIALIS) 20 MG tablet Take 0.5-1 tablets (10-20 mg total) by mouth every other day as needed for erectile dysfunction. 10/26/15  Yes Josalyn  Funches, MD  traMADol (ULTRAM) 50 MG tablet Take 50 mg by mouth 2 (two) times daily as needed for moderate pain.   Yes Historical Provider, MD       Physical Exam: BP 134/94 (BP Location: Left Arm)   Pulse (!) 59   Temp 97.8 F (36.6 C) (Oral)   Resp 13   Ht 6\' 4"  (1.93 m)   Wt 98.9 kg (218 lb)   SpO2 95%   BMI 26.54 kg/m  General appearance: Well-developed, adult male, alert and in no acute distress, appears tearful.   Eyes: Anicteric, conjunctiva pink, lids and lashes normal.     ENT: No nasal deformity, discharge, or epistaxis.  OP moist with few whitish plaques on posterior pharynx, unclear significance.   Lymph: No cervical or supraclavicular lymphadenopathy. Skin: Warm and dry.  No jaundice.  No suspicious rashes or lesions. Cardiac: Regular rate with irregular beats, nl S1-S2, no murmurs appreciated.  Capillary refill is brisk.  JVP normal.  No LE edema.  Radial and  DP pulses 2+ and symmetric. Respiratory: Normal respiratory rate and rhythm.  CTAB without rales or wheezes. GI: Abdomen soft without rigidity.  Mild non-focal diffuse TTP without guarding or rebound. No ascites, distension, hepatosplenomegaly.   MSK: No deformities or effusions.  No clubbing/cyanosis. Neuro: Cranial nerves normal. Sensorium intact and responding to questions, attention normal.  Speech is fluent.  Moves all extremities equally and with normal coordination.    Psych: Affect tearful.  Judgment and insight appear normal.       Labs on Admission:  I have personally reviewed following labs and imaging studies: CBC:  Recent Labs Lab 11/07/15 0154  WBC 12.1*  HGB 18.5*  HCT 50.1  MCV 80.0  PLT [id number]   Basic Metabolic Panel:  Recent Labs Lab 11/07/15 0154  NA 132*  K 3.3*  CL 95*  CO2 23  GLUCOSE 111*  BUN <5*  CREATININE 0.70  CALCIUM 9.4  MG 2.4   GFR: Estimated Creatinine Clearance: 135.6 mL/min (by C-G formula based on SCr of 0.8 mg/dL).     Radiological Exams on  Admission: Personally reviewed: Dg Chest 2 View  Result Date: 11/07/2015 CLINICAL DATA:  Upper chest pain and dyspnea for several weeks, worse tonight. EXAM: CHEST  2 VIEW COMPARISON:  05/25/2015 FINDINGS: The lungs are clear. The pulmonary vasculature is normal. Heart size is normal. Hilar and mediastinal contours are unremarkable. There is no pleural effusion. IMPRESSION: No active cardiopulmonary disease. Electronically Signed   By: Andreas Newport M.D.   On: 11/07/2015 02:22    EKG: Independently reviewed. Rate 83, QTc 466, PVCs present.  Telemetry reviewed, and there are short runs of ventricular tachycardia every 3-5 minutes over two hours, from 3 beats to 25 beats.  Echocardiogram 2015:  EF 50-55%  NM perfusion study 2015: EF 40% Inferior wall ischemia at apex, intermediate to high risk findings.  Left heart catheterization 2015: LV was mildly enlarged with global hypokinesia, EF of approximately 45-50%.   Left main was long which was patent.   LAD had distal diffuse narrowing in the range of 20-30%, otherwise is patent.  Diagonal 1, 2, 3, were very, very small.   Ramus is large which is patent.   Left circumflex is small which has 10-15% proximal stenosis.   OM1 was moderate sized, which was patent.   OM 2-4 were very small.   RCA has 15-20% mid and distal junction stenosis.   PDA is small which is patent.   PLV branch is large which is patent.      Assessment/Plan 1. Suicidal ideation:  -Sitter at bedside -Consult to Psychiatry, appreciate cares   2. Non-sustained VT in setting of NICM:  Discussed antiarrhythmic with EDP, but hemodynamically stable, will defer this to Cardiology until after alcohol intoxication resolves and electrolytes repleted. -Replete K -Monitor on telemetry -Trend troponins -Echocardiogram ordered -Consult to Cardiology, appreciate cares  3. Hypokalemia:  40 mEq given in ER.  Mag replete.  Goal K>4, Mg>2 -Give IV K 30 mEq now -Repeat  BMP tomorrow  4. Hypertension:  -Continue home metorpolol  5. Chronic pain from back pain, neck pain, neuropathy:  -Continue Tylenol 3 PRN for pain -Continue gabapentin -Continue Flexeril PRN -Naproxen PRN for pain  6. Depression/Bipolar:  Follows with Dr. Adele Schilder.  Not currently on anti-depressant (was started on Lamictal it appears in June, but is not taking this currently). -Continue quetiapine PRN at bedtime  7. Alcohol intoxication:  Alcohol level 268 mg/dL at admission.  8. Hyponatremia: -Check urine  and serum osmolality     DVT prophylaxis: Lovenox  Code Status: FULL  Family Communication: None present  Disposition Plan: Anticipate replete electrolytes, trend troponins and obtain echocardiogram.  Cardiology consultation.  When cleared by Cardiology, will discharge with disposition per Psychiatry given suicidality. Consults called: None overnight Admission status: OBS, tele   Medical decision making: Patient seen at 5:31 AM on 11/07/2015.  The patient was discussed with Dr. Randal Buba. What exists of the patient's chart was reviewed in depth.  Clinical condition: stable.        Edwin Dada [facility] Pager [phone]

## 2015-11-07 NOTE — Clinical Social Work Psych Assess (Signed)
Clinical Social Work Nature conservation officer  Clinical Social Worker:  Mark Hopping, LCSW Date/Time:  11/07/2015, 12:47 PM Referred By:  Clinical Social Work Date Referred:  11/07/15 Reason for Referral:  Behavioral Health Issues   Presenting Symptoms/Problems  Presenting Symptoms/Problems(in person's/family's own words):  "I suffer from chronic pain and lately, I have been feeling very overwhelmed ."   The patient has been drinking again (60-120 oz beer daily) over the last week.  Over the last few days, he has felt suicidal again, without specific plan.  Tonight, he began to have onset of his chronic intermittent chest pain (chest pressure across the chest, radiating down arm, started at rest but worse with exertion).  He checked his pulse and had palpitations.  He then felt overwhelmed by his chronic foot, back, neck, and chest pain and so he came to the ER.      Abuse/Neglect/Trauma History  Abuse/Neglect/Trauma History:  Denies History Abuse/Neglect/Trauma History Comments (indicate dates):  Patient denies Abuse/Neglect/Trauma history.    Psychiatric History  Psychiatric History:  Inpatient/Hospitalization, Outpatient Treatment Psychiatric Medication: Seroquel "It helps me sleep."   Current Mental Health Hospitalizations/Previous Mental Health History:  Patient reports [facility] admission in 2015 for suicidal ideations and alcohol abuse.    Current Provider:  [facility] and [name]/Dr. Boykin [name] Place and Date:    Current Medications:  Mark [name], Abud [id number] (CSN: [id number]) (50 y.o. M) (Adm: 11/07/15)  [id number]  Scheduled Meds Sorted by Name  for Mark [name], Mark [name] as of 11/07/15 1357   Legend:                    Inactive   Active   Linked          Medications 11/07/15 11/08/15 11/09/15 11/10/15 11/11/15 11/12/15 11/13/15  enoxaparin (LOVENOX) injection 40 mg Dose: 40 mg Freq: Every 24 hours Route: [place] Start: 11/07/15  1000   Admin Instructions:  Pharmacy may adjust. Do NOT expel air bubble from syringe before giving.   0940    1000    1000    1000    1000    [id number]    [id number]     folic acid (FOLVITE) tablet 1 mg Dose: 1 mg Freq: Daily Route: PO Start: 11/07/15 1000   Admin Instructions:  when / if taking POs.   0940    1000    1000    1000    1000    1000    1000     gabapentin (NEURONTIN) capsule 800 mg Dose: 800 mg Freq: 3 times daily Route: PO Start: 11/07/15 1000   0941   1600   2200    1000   1600   2200    1000   1600   2200    1000   1600   2200    1000   1600   2200    1000   1600   2200    1000   1600   2200     LORazepam (ATIVAN) tablet 0-4 mg Dose: 0-4 mg Freq: Every 6 hours Route: PO Start: 11/07/15 1000 End: 11/09/15 0959   0925   1600   2200    0400   1000   1600   2200    0400   0959-D/C'd        Followed by LORazepam (ATIVAN) tablet 0-4 mg Dose: 0-4 mg Freq: Every 12 hours Route: PO Start: 11/09/15 1000 End: 11/11/15  0959     1000   2200    1000   2200    0959-D/C'd      metoprolol (LOPRESSOR) tablet 50 mg Dose: 50 mg Freq: 2 times daily Route: PO Indications of Use: HYPERTENSION Start: 11/07/15 1000   Admin Instructions:  (BETA BLOCKER)   0940   2200    1000   2200    1000   2200    1000   2200    1000   2200    1000   2200    1000   2200     multivitamin with minerals tablet 1 tablet Dose: 1 tablet Freq: Daily Route: PO Start: 11/07/15 1000   Admin Instructions:  when / if taking POs.   0940    1000    1000    1000    1000    1000    1000     potassium chloride SA (K-DUR,KLOR-CON) CR tablet 40 mEq Dose: 40 mEq Freq: Once Route: PO Start: 11/07/15 1400   Admin Instructions:  Swallow tablets whole. DO NOT crush, chew or suck on tablet. You may break tablet in half and swallow each half separately with a glass of water. Whole tablet may be  dissolved in 4 ounces of water, allow 2 minutes to dissolve, then stir for half minute. Swirl and drink all the liquid right away.   1400           sodium chloride flush (NS) 0.9 % injection 3 mL Dose: 3 mL Freq: Every 12 hours Route: IV Start: 11/07/15 1000   (1000)   2200    1000   2200    1000   2200    1000   2200    1000   2200    1000   2200    1000   2200     thiamine (VITAMIN B-1) tablet 100 mg Dose: 100 mg Freq: Daily Route: PO Start: 11/07/15 1000   0940    1000    1000    1000    1000    1000    1000     Or thiamine (B-1) injection 100 mg Dose: 100 mg Freq: Daily Route: IV Start: 11/07/15 1000      1000    1000    1000    1000    1000    1000     Medications 11/07/15 11/08/15 11/09/15 11/10/15 11/11/15 11/12/15 11/13/15       Previous Inpatient Admission/Date/Reason:     Emotional Health/Current Symptoms  Suicide/Self Harm: Suicidal Ideation  Attempt in Past (date/description):  Patient denies attempting suicide.   Other Harmful Behavior (ex. homicidal ideation) (describe): Denies homicidal    Psychotic/Dissociative Symptoms  Psychotic/Dissociative Symptoms: None Reported Other Psychotic/Dissociative Symptoms:  Patient denies visual and auditory hallucinations.    Attention/Behavioral Symptoms  Attention/Behavioral Symptoms: Within Normal Limits Other Attention/Behavioral Symptoms:  Patient reports he suffers from chronic pain. He reports he has neuropathy in his feet that has moved to his legs. Patient reports he is also having issues with his heart and has Chronic COPD.    Cognitive Impairment  Cognitive Impairment:  Orientation - Place, Orientation - Time, Orientation - Situation, Orientation - Self, Within Normal Limits Other Cognitive Impairment: Alert and Oriented.    Mood and Adjustment  Mood and Adjustment:   Calm and Cooperative    Stress, Anxiety, Trauma, Any Recent  Loss/Stressor  Stress, Anxiety, Trauma, Any  Recent Loss/Stressor: Anxiety, Other - See Comment Anxiety (frequency):  Patient reports anxiety from the pain.   Phobia (specify):  None  Compulsive Behavior (specify):  Binge Drinking to tolerate pain.   Obsessive Behavior (specify):   Other Stress, Anxiety, Trauma, Any Recent Loss/Stressor:  Patient reports  he is stressed about his disability. Patient reports he applied twice and his application is still pending.    Substance Abuse/Use  Substance Abuse/Use: Current Substance Use SBIRT Completed (please refer for detailed history):   Self-reported Substance Use (last use and frequency):  Alcohol Use and patient reports last drink was 8/28  Urinary Drug Screen Completed: Yes Alcohol Level: 268    Environment/Housing/Living Arrangement  Environmental/Housing/Living Arrangement: With Biological Parent(s) Who is in the Home:  Mother and Stepfather  Emergency Contact:   Mother [phone]  [phone]      Financial  Financial:  Unemployed, Disability pending.    Patient's Strengths and Goals  Patient's Strengths and Goals (patient's own words): " The tylenol 3 helps with my pain, I just want to feel better."   Clinical Social Worker's Interpretive Summary  Clinical Social Workers Interpretive Summary: Patient reports he has been in having overwhelming pain as result has been having suicidal ideations.Patietn denies attempting suicide. Patient reports he has suffered with chronic pain for the last twenty years, "it was tolerable until the neuropathy".  Patient reports he used methadone for pain for several years until the first early 2016 when his doctor stopped prescribing. Patient reports stress due to his pending disability.   Patient reports alcohol use, 12 pack a day, for 4-5 days straight, once a month. Patient reports his last drink was yesterday. Patient denies any formal SA treatment. Patient reports outpatient [facility]  treatment with psychiatrist twice. Patient cannot recall physicians name. Patient reports he was prescribed Lamictal and stop taking, " it made me feel weird"and another medication that he reports made him feel homicidal.   Patient reports his major support system at this time is mother.   Patient will need to be seen by Psychiatrist MD for further evaluation.    Disposition  Disposition:  Psychiatrist recommendations.

## 2015-11-07 NOTE — Consult Note (Signed)
Reason for Consult:atypical chest pain/nonsustained ventricular tachycardia Referring Physician:[facility]  Mark [name] is an 50 y.o. male.  [name] is a 50 year old male with past medical history significant for nonobstructive coronary artery disease, hypertension, history of recurrent nonsustained ventricular tachycardia, hyperlipidemia, and GERD, tobacco abuse, EtOH abuse, chronic pain syndrome,depression,  suicidal ideation was admitted because of vague retrosternal chest pain described as pressure radiating to right arm.  States pain increases with deep breathing and smoking.  Also complains of suicidal ideation.  Denies any exertional chest pain.  Denies nausea, vomiting, diaphoresis.  Denies PND, orthopnea and leg swelling.  Also complains of frequent palpitations noted to have nonsustained VT on the monitor patient states he has been drinking heavily for a few days.   Past Medical History:  Diagnosis Date  . Alcohol withdrawal ([place])   . Anxiety Dx 2014  . COPD (chronic obstructive pulmonary disease) ([place])   . Drug abuse    pt reports opioid dependence due to previous back surgeries  . ETOH abuse   . GERD (gastroesophageal reflux disease) Dx 2003  . Headache(784.0)   . Hepatitis   . Hyperlipidemia Dx 2000  . Hypertension Dx 2011  . Irregular heart beat   . 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 Mental disorder   . Neuropathy ([place]) 06/07/2013  . Seizure due to alcohol withdrawal ([place]) 01/23/2014   Per patient report  . Shortness of breath   . Withdrawal seizures ([place]) 2014    Past Surgical History:  Procedure Laterality Date  . [place] STUDY N/A 01/16/2015   Procedure: [place] STUDY;  Surgeon: Manus Gunning, MD;  Location: [facility];  Service: Gastroenterology;  Laterality: N/A;  . [place]  . FUNDOPLASTY TRANSTHORACIC  2003  . LEFT HEART CATHETERIZATION WITH CORONARY ANGIOGRAM N/A 01/13/2014   Procedure: LEFT HEART CATHETERIZATION  WITH CORONARY ANGIOGRAM;  Surgeon: Clent Demark, MD;  Location: [facility];  Service: Cardiovascular;  Laterality: N/A;  . NASAL SINUS SURGERY    . NECK SURGERY  2000  . NISSEN FUNDOPLICATION    . SUBACROMIAL DECOMPRESSION Right 11/16/2014   Procedure: RIGHT SHOULDER ARTHROSCOPY WITH SUBACROMIAL DECOMPRESSION ;  Surgeon: Leandrew Koyanagi, MD;  Location: [facility];  Service: Orthopedics;  Laterality: Right;    Family History  Problem Relation Age of Onset  . Lung cancer Father     was a smoker  . Alcohol abuse Father   . Hypertension Mother   . Colon polyps Mother   . Breast cancer Paternal Grandmother   . Alcohol abuse Paternal Grandfather     Social History:  reports that he has been smoking Cigarettes.  He has a 15.00 pack-year smoking history. He has never used smokeless tobacco. He reports that he drinks alcohol. He reports that he does not use drugs.  Allergies:  Allergies  Allergen Reactions  . Cozaar [Losartan] Anaphylaxis  . Lisinopril Anaphylaxis    Medications: I have reviewed the patient's current medications.  Results for orders placed or performed during the hospital encounter of 11/07/15 (from the past 48 hour(s))  Basic metabolic panel     Status: Abnormal   Collection Time: 11/07/15  1:54 AM  Result Value Ref Range   Sodium 132 (L) 135 - 145 mmol/L   Potassium 3.3 (L) 3.5 - 5.1 mmol/L   Chloride 95 (L) 101 - 111 mmol/L   CO2 23 22 - 32 mmol/L   Glucose, Bld 111 (H) 65 -  99 mg/dL   BUN <5 (L) 6 - 20 mg/dL   Creatinine, Ser 0.70 0.61 - 1.24 mg/dL   Calcium 9.4 8.9 - 10.3 mg/dL   GFR calc non Af Amer >60 >60 mL/min   GFR calc Af Amer >60 >60 mL/min    Comment: (NOTE) The eGFR has been calculated using the CKD EPI equation. This calculation has not been validated in all clinical situations. eGFR's persistently <60 mL/min signify possible Chronic Kidney Disease.    Anion gap 14 5 - 15  CBC     Status: Abnormal   Collection Time: 11/07/15   1:54 AM  Result Value Ref Range   WBC 12.1 (H) 4.0 - 10.5 K/uL   RBC 6.26 (H) 4.22 - 5.81 MIL/uL   Hemoglobin 18.5 (H) 13.0 - 17.0 g/dL   HCT 50.1 39.0 - 52.0 %   MCV 80.0 78.0 - 100.0 fL   MCH 29.6 26.0 - 34.0 pg   MCHC 36.9 (H) 30.0 - 36.0 g/dL   RDW 12.8 11.5 - 15.5 %   Platelets 372 150 - 400 K/uL  Ethanol     Status: Abnormal   Collection Time: 11/07/15  1:54 AM  Result Value Ref Range   Alcohol, Ethyl (B) 268 (H) <5 mg/dL    Comment:        LOWEST DETECTABLE LIMIT FOR SERUM ALCOHOL IS 5 mg/dL FOR MEDICAL PURPOSES ONLY   Salicylate level     Status: None   Collection Time: 11/07/15  1:54 AM  Result Value Ref Range   Salicylate Lvl <8.1 2.8 - 30.0 mg/dL  Acetaminophen level     Status: Abnormal   Collection Time: 11/07/15  1:54 AM  Result Value Ref Range   Acetaminophen (Tylenol), Serum <10 (L) 10 - 30 ug/mL    Comment:        THERAPEUTIC CONCENTRATIONS VARY SIGNIFICANTLY. A RANGE OF 10-30 ug/mL MAY BE AN EFFECTIVE CONCENTRATION FOR MANY PATIENTS. HOWEVER, SOME ARE BEST TREATED AT CONCENTRATIONS OUTSIDE THIS RANGE. ACETAMINOPHEN CONCENTRATIONS >150 ug/mL AT 4 HOURS AFTER INGESTION AND >50 ug/mL AT 12 HOURS AFTER INGESTION ARE OFTEN ASSOCIATED WITH TOXIC REACTIONS.   Magnesium     Status: None   Collection Time: 11/07/15  1:54 AM  Result Value Ref Range   Magnesium 2.4 1.7 - 2.4 mg/dL  I-stat troponin, ED     Status: None   Collection Time: 11/07/15  2:14 AM  Result Value Ref Range   Troponin i, poc 0.00 0.00 - 0.08 ng/mL   Comment 3            Comment: Due to the release kinetics of cTnI, a negative result within the first hours of the onset of symptoms does not rule out myocardial infarction with certainty. If myocardial infarction is still suspected, repeat the test at appropriate intervals.   Troponin I (q 6hr x 3)     Status: None   Collection Time: 11/07/15  6:58 AM  Result Value Ref Range   Troponin I <0.03 <0.03 ng/mL  Osmolality     Status:  Abnormal   Collection Time: 11/07/15  6:58 AM  Result Value Ref Range   Osmolality 322 (HH) 275 - 295 mOsm/kg    Comment: REPEATED TO VERIFY CRITICAL RESULT CALLED TO, READ BACK BY AND VERIFIED WITH: NICOLE MCKOY,MLT AT [place] AT 1144 11/07/15 BY ZBEECH. CRITICAL RESULT CALLED TO, READ BACK BY AND VERIFIED WITH: RYAN,K. RN '@[id number]'  ON 8.29.17 BY NMCCOY   Basic metabolic panel  Status: Abnormal   Collection Time: 11/07/15  7:00 AM  Result Value Ref Range   Sodium 136 135 - 145 mmol/L   Potassium 3.8 3.5 - 5.1 mmol/L   Chloride 101 101 - 111 mmol/L   CO2 24 22 - 32 mmol/L   Glucose, Bld 104 (H) 65 - 99 mg/dL   BUN <5 (L) 6 - 20 mg/dL   Creatinine, Ser 0.71 0.61 - 1.24 mg/dL   Calcium 9.0 8.9 - 10.3 mg/dL   GFR calc non Af Amer >60 >60 mL/min   GFR calc Af Amer >60 >60 mL/min    Comment: (NOTE) The eGFR has been calculated using the CKD EPI equation. This calculation has not been validated in all clinical situations. eGFR's persistently <60 mL/min signify possible Chronic Kidney Disease.    Anion gap 11 5 - 15  CBC     Status: None   Collection Time: 11/07/15  7:00 AM  Result Value Ref Range   WBC 6.3 4.0 - 10.5 K/uL   RBC 5.75 4.22 - 5.81 MIL/uL   Hemoglobin 16.5 13.0 - 17.0 g/dL   HCT 46.1 39.0 - 52.0 %   MCV 80.2 78.0 - 100.0 fL   MCH 28.7 26.0 - 34.0 pg   MCHC 35.8 30.0 - 36.0 g/dL   RDW 12.8 11.5 - 15.5 %   Platelets 352 150 - 400 K/uL  Magnesium     Status: Abnormal   Collection Time: 11/07/15  7:00 AM  Result Value Ref Range   Magnesium 2.5 (H) 1.7 - 2.4 mg/dL  Sodium, urine, random     Status: None   Collection Time: 11/07/15  7:54 AM  Result Value Ref Range   Sodium, Ur <10 mmol/L    Comment: Performed at [facility]  Osmolality, urine     Status: Abnormal   Collection Time: 11/07/15  7:55 AM  Result Value Ref Range   Osmolality, Ur 178 (L) 300 - 900 mOsm/kg    Comment: Performed at [facility]    Dg Chest 2 View  Result Date:  11/07/2015 CLINICAL DATA:  Upper chest pain and dyspnea for several weeks, worse tonight. EXAM: CHEST  2 VIEW COMPARISON:  05/25/2015 FINDINGS: The lungs are clear. The pulmonary vasculature is normal. Heart size is normal. Hilar and mediastinal contours are unremarkable. There is no pleural effusion. IMPRESSION: No active cardiopulmonary disease. Electronically Signed   By: Andreas Newport M.D.   On: 11/07/2015 02:22    Review of Systems  Constitutional: Negative for chills and fever.  Eyes: Negative for double vision.  Respiratory: Negative for shortness of breath.   Cardiovascular: Positive for chest pain and palpitations.  Gastrointestinal: Negative for abdominal pain and vomiting.  Genitourinary: Negative for dysuria.  Neurological: Negative for dizziness and headaches.   Blood pressure (!) 116/105, pulse 69, temperature 97.6 F (36.4 C), temperature source Oral, resp. rate 15, height '6\' 4"'  (1.93 m), weight 218 lb (98.9 kg), SpO2 100 %. Physical Exam  Constitutional: He is oriented to person, place, and time.  HENT:  Head: Normocephalic and atraumatic.  Eyes: Conjunctivae and EOM are normal. Pupils are equal, round, and reactive to light. Left eye exhibits no discharge. No scleral icterus.  Neck: Normal range of motion. Neck supple. No JVD present. No tracheal deviation present. No thyromegaly present.  Cardiovascular: Normal rate and regular rhythm.   Murmur (soft systolic murmur noted.  No S3 gallop) heard. Respiratory: Effort normal and breath sounds normal. No respiratory distress.  He has no wheezes. He has no rales.  GI: Soft. Bowel sounds are normal. He exhibits distension. There is no tenderness. There is no rebound and no guarding.  Musculoskeletal: He exhibits no edema, tenderness or deformity.  Neurological: He is alert and oriented to person, place, and time.    Assessment/Plan: Status post recurrent nonsustained VT in the setting of hypokalemia and EtOH  intoxication Atypical chest pain. Nonobstructive CAD. Asymptomatic LV systolic dysfunction in the past Hypertension. Hyperlipidemia. EtOH abuse. Tobacco abuse. Suicidal ideation. Depression. Chronic pain syndrome. Plan Agree with present management. Increase beta blockers as blood pressure and heart rate tolerates. Discussed with patient regarding lifestyle changes.  Smoking and alcohol cessation. Consider restarting losartan, low dose as blood pressure tolerates Agree with psych consult Charolette Forward 11/07/2015, 12:30 PM

## 2015-11-07 NOTE — ED Triage Notes (Signed)
Pt states that he has been drinking x 5 days and is having chest pain which he has every day. States that he also feels suicidal. Alert and oriented. Denies illicit drug use.

## 2015-11-08 DIAGNOSIS — I472 Ventricular tachycardia: Principal | ICD-10-CM

## 2015-11-08 DIAGNOSIS — F3130 Bipolar disorder, current episode depressed, mild or moderate severity, unspecified: Secondary | ICD-10-CM

## 2015-11-08 DIAGNOSIS — F10121 Alcohol abuse with intoxication delirium: Secondary | ICD-10-CM

## 2015-11-08 DIAGNOSIS — R45851 Suicidal ideations: Secondary | ICD-10-CM

## 2015-11-08 LAB — BASIC METABOLIC PANEL
ANION GAP: 7 (ref 5–15)
BUN: 7 mg/dL (ref 6–20)
CALCIUM: 9 mg/dL (ref 8.9–10.3)
CO2: 26 mmol/L (ref 22–32)
Chloride: 104 mmol/L (ref 101–111)
Creatinine, Ser: 0.82 mg/dL (ref 0.61–1.24)
Glucose, Bld: 95 mg/dL (ref 65–99)
POTASSIUM: 3.7 mmol/L (ref 3.5–5.1)
Sodium: 137 mmol/L (ref 135–145)

## 2015-11-08 LAB — CBC
HEMATOCRIT: 41.4 % (ref 39.0–52.0)
HEMOGLOBIN: 14.3 g/dL (ref 13.0–17.0)
MCH: 27.8 pg (ref 26.0–34.0)
MCHC: 34.5 g/dL (ref 30.0–36.0)
MCV: 80.4 fL (ref 78.0–100.0)
PLATELETS: 278 10*3/uL (ref 150–400)
RBC: 5.15 MIL/uL (ref 4.22–5.81)
RDW: 12.6 % (ref 11.5–15.5)
WBC: 8.6 10*3/uL (ref 4.0–10.5)

## 2015-11-08 LAB — MAGNESIUM: MAGNESIUM: 2.3 mg/dL (ref 1.7–2.4)

## 2015-11-08 LAB — MRSA PCR SCREENING: MRSA BY PCR: NEGATIVE

## 2015-11-08 MED ORDER — LORAZEPAM 2 MG/ML IJ SOLN
2.0000 mg | INTRAMUSCULAR | Status: DC
Start: 2015-11-08 — End: 2015-11-08

## 2015-11-08 MED ORDER — ZOLPIDEM TARTRATE 5 MG PO TABS
5.0000 mg | ORAL_TABLET | Freq: Once | ORAL | Status: AC
  Administered 2015-11-08: 5 mg via ORAL
  Filled 2015-11-08: qty 1

## 2015-11-08 MED ORDER — LORAZEPAM 2 MG/ML IJ SOLN
2.0000 mg | INTRAMUSCULAR | Status: DC | PRN
  Administered 2015-11-08 (×2): 2 mg via INTRAVENOUS
  Filled 2015-11-08 (×2): qty 1

## 2015-11-08 MED ORDER — ACETAMINOPHEN-CODEINE #3 300-30 MG PO TABS
1.0000 | ORAL_TABLET | ORAL | Status: DC | PRN
  Administered 2015-11-08 – 2015-11-09 (×4): 1 via ORAL
  Filled 2015-11-08 (×4): qty 1

## 2015-11-08 MED ORDER — FOLIC ACID 5 MG/ML IJ SOLN
1.0000 mg | Freq: Every day | INTRAMUSCULAR | Status: DC
  Administered 2015-11-08 – 2015-11-09 (×2): 1 mg via INTRAVENOUS
  Filled 2015-11-08 (×6): qty 0.2

## 2015-11-08 MED ORDER — LORAZEPAM 1 MG PO TABS
1.0000 mg | ORAL_TABLET | ORAL | Status: DC
Start: 2015-11-08 — End: 2015-11-08

## 2015-11-08 MED ORDER — HALOPERIDOL LACTATE 5 MG/ML IJ SOLN
2.0000 mg | Freq: Four times a day (QID) | INTRAMUSCULAR | Status: DC | PRN
  Administered 2015-11-09: 2 mg via INTRAVENOUS
  Filled 2015-11-08: qty 1

## 2015-11-08 MED ORDER — GABAPENTIN 400 MG PO CAPS
400.0000 mg | ORAL_CAPSULE | Freq: Three times a day (TID) | ORAL | Status: DC
  Administered 2015-11-08 – 2015-11-10 (×7): 400 mg via ORAL
  Filled 2015-11-08 (×7): qty 1

## 2015-11-08 MED ORDER — HALOPERIDOL LACTATE 5 MG/ML IJ SOLN
5.0000 mg | Freq: Once | INTRAMUSCULAR | Status: AC
  Administered 2015-11-08: 5 mg via INTRAMUSCULAR
  Filled 2015-11-08: qty 1

## 2015-11-08 NOTE — Progress Notes (Signed)
Pt becoming increasingly agitated and confused; trying to leave. Security called.

## 2015-11-08 NOTE — Progress Notes (Signed)
Hospitalist Cross-coverage Note Called re: patient agitation, threatening to leave.  Patient admitted by me yesterday for suicidal ideation, alcohol intoxication, complicated by non-sustained VT.  Seen by Cardiology today and medically cleared.    Unfortunately, patient seen by Psych LCSW recommending Psych MD consult for suicidal ideation, but not seen by Psych MD today.  Has received lorazepam dosing for agitation per CIWA protocol previously tonight.  Now patient threatening to leave.  Per staff, patient aggressive, requiring restraints, [employer] to be present.  He made statements that there were vampires in the room.  On my evaluation he was handcuffed to the bed.  He stated that he wanted to leave, and stated that he did not want family to know he was here.  He was frustrated and could not articulate potential benefits or harms of psychiatric evaluation and contracting for safety.  He confirmed that he had been seeing things that aren't there, including "seeing weird ears and things on people".  In light of his previous (described to me last night) failed suicide attempts, his ongoing alcohol use and recent intoxication, his male sex and his chronic pain, I believe the patient is an imminent risk to himself to leave the hospital intoxicated with lorazepam and recently using alcohol and expressing suicidal thoughts.  IVC faxed to magistrate.

## 2015-11-08 NOTE — Progress Notes (Signed)
Order placed for 4 point restraints

## 2015-11-08 NOTE — Consult Note (Signed)
PULMONARY / CRITICAL CARE MEDICINE   Name: Mark [name] MRN: [id number] DOB: [birth year]    ADMISSION DATE:  11/07/2015 CONSULTATION DATE:  11/08/2015  REFERRING MD:  Debbe Odea, M.D. / [facility]  CHIEF COMPLAINT:  Acute Encephalopathy  HISTORY OF PRESENT ILLNESS:  History obtained from the patient's electronic medical record given altered mental status. Patient has a history of chronic alcohol use as well as bipolar depression and ongoing tobacco use. Patient presented with suicidal ideation and chest discomfort. Patient admitted to drinking 60-120 ounces of beer daily over the last week. Patient denied any specific plan on presentation. He was noted to have short runs of wide complex tachycardia and was subsequently evaluated by cardiology who recommended increasing beta blocker and alcohol/tobacco cessation. Overnight the patient became progressively more combative actually pulling the fire alarm and attempting to leave. Patient was given IV Ativan after contacting the attending physician. Security and [employer] were at bedside. Orders were also obtained for four point restraints. With patient becoming progressively more combative patient was transferred to the intensive care unit for infusion of  Precedex. Patient received a total of 5 mg of IV Haldol and 16 mg of IV Ativan in the last 12 hours. Since transferring to the intensive care unit the patient has remained relatively somnolent and noncombative. Has not required initiation of Precedex infusion.  PAST MEDICAL HISTORY :  Past Medical History:  Diagnosis Date  . Alcohol withdrawal ([place])   . Anxiety Dx 2014  . COPD (chronic obstructive pulmonary disease) ([place])   . Drug abuse    pt reports opioid dependence due to previous back surgeries  . ETOH abuse   . GERD (gastroesophageal reflux disease) Dx 2003  . Headache(784.0)   . Hepatitis   . Hyperlipidemia Dx 2000  . Hypertension Dx 2011  . Irregular heart beat   . Long  Q-T syndrome 01/23/2014  . Mental disorder   . Neuropathy ([place]) 06/07/2013  . Seizure due to alcohol withdrawal ([place]) 01/23/2014   Per patient report  . Shortness of breath   . Withdrawal seizures ([place]) 2014    PAST SURGICAL HISTORY: Past Surgical History:  Procedure Laterality Date  . [place] STUDY N/A 01/16/2015   Procedure: [place] STUDY;  Surgeon: Manus Gunning, MD;  [facility];  Service: Gastroenterology;  Laterality: N/A;  . [place]  . FUNDOPLASTY TRANSTHORACIC  2003  . LEFT HEART CATHETERIZATION WITH CORONARY ANGIOGRAM N/A 01/13/2014   Procedure: LEFT HEART CATHETERIZATION WITH CORONARY ANGIOGRAM;  Surgeon: Clent Demark, MD;  Location: [facility] CATH LAB;  Service: Cardiovascular;  Laterality: N/A;  . NASAL SINUS SURGERY    . NECK SURGERY  2000  . NISSEN FUNDOPLICATION    . SUBACROMIAL DECOMPRESSION Right 11/16/2014   Procedure: RIGHT SHOULDER ARTHROSCOPY WITH SUBACROMIAL DECOMPRESSION ;  Surgeon: Leandrew Koyanagi, MD;  Location: [facility];  Service: Orthopedics;  Laterality: Right;    Allergies  Allergen Reactions  . Cozaar [Losartan] Anaphylaxis  . Lisinopril Anaphylaxis    No current facility-administered medications on file prior to encounter.    Current Outpatient Prescriptions on File Prior to Encounter  Medication Sig  . cyclobenzaprine (FLEXERIL) 10 MG tablet Take 1 tablet (10 mg total) by mouth at bedtime as needed for muscle spasms.  Marland Kitchen dicyclomine (BENTYL) 20 MG tablet Take 1 tablet (20 mg total) by mouth 3 (three) times daily as needed for spasms.  . metoprolol (LOPRESSOR) 50 MG  tablet Take 1 tablet (50 mg total) by mouth 2 (two) times daily.  . tadalafil (CIALIS) 20 MG tablet Take 0.5-1 tablets (10-20 mg total) by mouth every other day as needed for erectile dysfunction.    FAMILY HISTORY:  Family History  Problem Relation Age of Onset  . Lung cancer Father     was a smoker  . Alcohol abuse Father   .  Hypertension Mother   . Colon polyps Mother   . Breast cancer Paternal Grandmother   . Alcohol abuse Paternal Grandfather     SOCIAL HISTORY: Social History  Substance Use Topics  . Smoking status: Current Every Day Smoker    Packs/day: 0.50    Years: 30.00    Types: Cigarettes  . Smokeless tobacco: Never Used     Comment: 11-30-2014 info given  . Alcohol use 0.0 oz/week     Comment: occ    REVIEW OF SYSTEMS:  Unable to obtain given altered mentation.  SUBJECTIVE: As above.  VITAL SIGNS: BP (!) 180/98 (BP Location: Left Arm)   Pulse 79   Temp 98.8 F (37.1 C) (Oral)   Resp 20   Ht 6\' 4"  (1.93 m)   Wt 218 lb (98.9 kg)   SpO2 98%   BMI 26.54 kg/m   HEMODYNAMICS:    VENTILATOR SETTINGS:    INTAKE / OUTPUT: I/O last 3 completed shifts: In: 600 [P.O.:600] Out: -   PHYSICAL EXAMINATION: General:  Sleeping. Sitter at bedside. No acute distress. Patient in 4 point restraints. Integument:  Warm & dry. No rash on exposed skin.  Lymphatics:  No appreciated cervical or supraclavicular lymphadenoapthy. HEENT:  Moist mucus membranes. No scleral injection or icterus.  Cardiovascular:  Regular rate. No edema. No appreciable JVD.  Pulmonary:  Good aeration & clear to auscultation bilaterally. Normal work of breathing on room air. Abdomen: Soft. Normal bowel sounds. Nondistended.  Musculoskeletal:  Normal bulk and tone.No joint deformity or effusion appreciated. Neurological:  No meningismus. Moving all 4 extremities equally. Wakes up intermittently and looks around the room but then falls asleep rather quickly. Psychiatric:  Unable to assess with altered mentation.   LABS:  BMET  Recent Labs Lab 11/07/15 0154 11/07/15 0700  NA 132* 136  K 3.3* 3.8  CL 95* 101  CO2 23 24  BUN <5* <5*  CREATININE 0.70 0.71  GLUCOSE 111* 104*    Electrolytes  Recent Labs Lab 11/07/15 0154 11/07/15 0700  CALCIUM 9.4 9.0  MG 2.4 2.5*    CBC  Recent Labs Lab  11/07/15 0154 11/07/15 0700  WBC 12.1* 6.3  HGB 18.5* 16.5  HCT 50.1 46.1  PLT 372 352    Coag's No results for input(s): APTT, INR in the last 168 hours.  Sepsis Markers No results for input(s): LATICACIDVEN, PROCALCITON, O2SATVEN in the last 168 hours.  ABG No results for input(s): PHART, PCO2ART, PO2ART in the last 168 hours.  Liver Enzymes No results for input(s): AST, ALT, ALKPHOS, BILITOT, ALBUMIN in the last 168 hours.  Cardiac Enzymes  Recent Labs Lab 11/07/15 0658 11/07/15 1250  TROPONINI <0.03 <0.03    Glucose No results for input(s): GLUCAP in the last 168 hours.  Imaging Dg Chest 2 View  Result Date: 11/07/2015 CLINICAL DATA:  Upper chest pain and dyspnea for several weeks, worse tonight. EXAM: CHEST  2 VIEW COMPARISON:  05/25/2015 FINDINGS: The lungs are clear. The pulmonary vasculature is normal. Heart size is normal. Hilar and mediastinal contours are unremarkable. There is  no pleural effusion. IMPRESSION: No active cardiopulmonary disease. Electronically Signed   By: Andreas Newport M.D.   On: 11/07/2015 02:22     STUDIES:  CXR PA/LAT 8/29:  No focal opacity.  MICROBIOLOGY: None  ANTIBIOTICS: None  SIGNIFICANT EVENTS: 8/29 - Admit 8/30 - Transfer to ICU due to combative nature   LINES/TUBES: PIV x3  DISCUSSION:  50 y.o. male with known and ongoing EtOH abuse. Transferred to the intensive care unit for close monitoring. Patient seems to be less combative in the intensive care unit. No signs of duress. Continuing CIWA protocol for now and holding on Precedex. High potential for further decompensation from the patient's delirium.   ASSESSMENT / PLAN:  NEUROLOGIC A:   Suicidal Ideation Acute Encephalopathy - Likely due to EtOH withdrawal. Chronic EtOH Abuse H/O Alcohol Withdrawal Seizure H/O Neuropathy H/O Opiod Drug Dependence H/O Anxiety/Bipolar Disorder  P:   RASS goal: 0 Holding Precedex gtt Continuing CIWA Protocol Haldol IV  prn Thiamine & FA IV daily Seizure precautions Fall Precautions Sitter at bedside Plan for Psychiatry Consult depending on mental status Holding home Neurontin, Tylenol #3, Flexeril, & Seroquel. Continuing Ultram prn  PULMONARY A: H/O COPD Tobacco Use Disorder  P:   Continuous pulse oximetry  Tobacco cessation education prior to discharge   CARDIOVASCULAR A:  Nonobstructive CAD Nonsustained V tach H/O HTN H/O Hyperlipidemia  P:  Vitals per unit protocol Continuous Telemetry monitoring Evaluated by Cardiology Lopressor 50mg  bid Consider restarting Losartan if BP tolerates  RENAL A:   No acute issues.  P:   Monitoring electrolytes daily Replacing electrolytes as indicated  GASTROINTESTINAL A:   H/O GERD  P:   NPO  HEMATOLOGIC A:   Leukocytosis - Resolved. Likely reactive.  P:  Trending cell counts with CBC Lovenox [place] daily  INFECTIOUS A:   No evidence of acute infection.  P:   Monitoring for fever or new infectious signs.  ENDOCRINE A:   No acute issue.   P:   Monitor glucose on daily labs.  FAMILY  - Updates: No family at bedside.  - Inter-disciplinary family meet or Palliative Care meeting due by:  8/6  I have spent a total of 37 minutes of critical care time caring for the patient and reviewing the patient's electronic medical record.  Sonia Baller Ashok Cordia, M.D. [facility] Pulmonary & Critical Care Pager:  [phone] After 3pm or if no response, call [phone] 11/08/2015, 1:34 AM

## 2015-11-08 NOTE — Progress Notes (Signed)
Subjective:  Events of last night noted.  Patient presently alert, awake, oriented.  Denies any chest pain or shortness of breath.  No further episodes of V. Tach on the monitor  Objective:  Vital Signs in the last 24 hours: Temp:  [97.5 F (36.4 C)-99 F (37.2 C)] 97.5 F (36.4 C) (08/30 1200) Pulse Rate:  [66-80] 74 (08/30 1233) Resp:  [15-30] 16 (08/30 1003) BP: (137-180)/(80-108) 171/98 (08/30 1003) SpO2:  [93 %-100 %] 95 % (08/30 1003)  Intake/Output from previous day: 08/29 0701 - 08/30 0700 In: 600 [P.O.:600] Out: 1000 [Urine:1000] Intake/Output from this shift: No intake/output data recorded.  Physical Exam: Neck: no adenopathy, no carotid bruit, no JVD and supple, symmetrical, trachea midline Lungs: clear to auscultation bilaterally Heart: regular rate and rhythm, S1, S2 normal and soft systolic murmur noted Abdomen: soft, non-tender; bowel sounds normal; no masses,  no organomegaly Extremities: extremities normal, atraumatic, no cyanosis or edema  Lab Results:  Recent Labs  11/07/15 0700 11/08/15 0323  WBC 6.3 8.6  HGB 16.5 14.3  PLT 352 278    Recent Labs  11/07/15 0700 11/08/15 0323  NA 136 137  K 3.8 3.7  CL 101 104  CO2 24 26  GLUCOSE 104* 95  BUN <5* 7  CREATININE 0.71 0.82    Recent Labs  11/07/15 0658 11/07/15 1250  TROPONINI <0.03 <0.03   Hepatic Function Panel No results for input(s): PROT, ALBUMIN, AST, ALT, ALKPHOS, BILITOT, BILIDIR, IBILI in the last 72 hours. No results for input(s): CHOL in the last 72 hours. No results for input(s): PROTIME in the last 72 hours.  Imaging: Imaging results have been reviewed and Dg Chest 2 View  Result Date: 11/07/2015 CLINICAL DATA:  Upper chest pain and dyspnea for several weeks, worse tonight. EXAM: CHEST  2 VIEW COMPARISON:  05/25/2015 FINDINGS: The lungs are clear. The pulmonary vasculature is normal. Heart size is normal. Hilar and mediastinal contours are unremarkable. There is no pleural  effusion. IMPRESSION: No active cardiopulmonary disease. Electronically Signed   By: Andreas Newport M.D.   On: 11/07/2015 02:22    Cardiac Studies:  Assessment/Plan:  Status post recurrent nonsustained VT in the setting of hypokalemia and EtOH intoxication Status postAtypical chest pain. Nonobstructive CAD. Asymptomatic LV systolic dysfunction in the past Hypertension. Hyperlipidemia. EtOH abuse. Tobacco abuse. Suicidal ideation. Depression. Chronic pain syndrome. Status post acute encephalopathy. Alcohol withdrawal syndrome. Plan Continue present management. No active cardiac issues at this point we'll sign off.  Follow-up with me in 2 weeks as outpatient or as appropriate.  LOS: 0 days    Charolette Forward 11/08/2015, 2:04 PM
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
## 2015-11-08 NOTE — Consult Note (Signed)
Mark [name]   Reason for [name]:  Suicidal ideation Referring Physician:  Dr. Loleta Books  Patient Identification: Mark [name] MRN:  [id number] Principal Diagnosis: Suicidal ideation Diagnosis:   Patient Active Problem List   Diagnosis Date Noted  . Suicidal ideation [R45.851] 11/07/2015  . Bipolar affective disorder, current episode depressed ([place]) [F31.30]   . Non-sustained ventricular tachycardia ([place]) [I47.2]   . GERD (gastroesophageal reflux disease) [K21.9] 07/24/2015  . Chronic anxiety [F41.9] 12/19/2014  . Tender prostate [N42.81] 09/16/2014  . Erectile dysfunction [N52.9] 09/16/2014  . Poor dentition [K08.9] 07/28/2014  . Chronic maxillary sinusitis [J32.0] 07/28/2014  . Vitamin D insufficiency [E55.9] 07/05/2014  . Closed fracture of 5th metacarpal [S62.308A] 07/04/2014  . Chronic fatigue [R53.82] 07/04/2014  . Chronic radicular low back pain [M54.10, G89.29] 06/02/2014  . Pain in joint, shoulder region [M25.519] 05/12/2014  . Bipolar depression ([place]) [F31.30] 02/08/2014  . Neuropathy ([place]) [G62.9] 02/08/2014  . Long Q-T syndrome [I45.81] 01/23/2014  . Hypokalemia [E87.6]   . Opioid dependence ([place]) [F11.20] 01/22/2014    Class: Acute  . Paroxysmal VT ([place]) [I47.2] 12/19/2013  . Chronic pain syndrome [G89.4] 12/28/2012  . Alcohol intoxication ([place]) [F10.129] 06/09/2012  . 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 Nonsustained ventricular tachycardia ([place]) [I47.2] 01/20/2012  . Tobacco abuse [Z72.0] 01/19/2012  . Essential hypertension [I10]   . Hyperlipidemia [E78.5]     Total Time spent with patient: 30 minutes  Subjective:   [name] is a 51 y.o. male patient admitted due to daily, heavy alcohol consumption and chest pain.  HPI:  50 year old male . History of alcohol dependence. Presented to the ED yesterday due to chest pain. In ED also reported suicidal ideations, without specific plan. Reported  daily, heavy drinking x 5 days, and admission BAL was 268 ( no UDS available ) . Patient states he was drinking about 12 beers per day.  At this time denies any recent drug abuse  An EKG and troponin were negative, but was noted to have periods of non sustained VT . As per chart notes, patient became increasingly agitated and combative yesterday evening, was physically aggressive, and required [employer] presence and four point restraints due to the severity of his presentation . As per chart , received 16 mgrs of Ativan and 5 mgrs of Haldol over the last 12 hours or so. At this time patient presents somnolent but arousable by calling his name- he presents relatively  calm and cooperative at this time. He states he does not have recollection of last night's events- states " I just wanted to go home" At this time not tremulous, not diaphoretic, vitals are stable, he is not significantly restless or agitated . Denies visual disturbances or visual hallucinations, and denies headache. He is partially oriented - knows he is in hospital but believes it is [facility], knows day of week, year, but identified month as October. Regarding mood, describes his mood as a"OK", and at this time denies depression . He denies any suicidal ideations .       Past Psychiatric History: patient denies psychiatric history at this time- as per chart review, he has had prior psychiatric admissions at [facility],  most recently 02/03/14.  At that time he was diagnosed with Major Depression, Alcohol and Opiate Dependencies . He is currently followed at outpatient psychiatric clinic, and notes indicate a diagnosis of Bipolar Disorder . Denies history of suicide attempts . Chart notes indicated that patient has  been on several different antidepressants , to include Remeron, Effexor XR, Prozac , Paxil , with limited response .  Most recent psychiatric medications were Cymbalta 30 mgrs QDAY, Seroquel 50 mgrs BID, and Lamictal 50 mgrs QDAY . It is  unclear at this time if patient has been taking his medications regularly- when asked about compliance with medications states he takes medications  " sometimes ".   Risk to Self: Is patient at risk for suicide?: Yes Risk to Others:   Prior Inpatient Therapy:   Prior Outpatient Therapy:    Past Medical History:  Past Medical History:  Diagnosis Date  . Alcohol withdrawal ([place])   . Anxiety Dx 2014  . COPD (chronic obstructive pulmonary disease) ([place])   . Drug abuse    pt reports opioid dependence due to previous back surgeries  . ETOH abuse   . GERD (gastroesophageal reflux disease) Dx 2003  . Headache(784.0)   . Hepatitis   . Hyperlipidemia Dx 2000  . Hypertension Dx 2011  . Irregular heart beat   . 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 Mental disorder   . Neuropathy ([place]) 06/07/2013  . Seizure due to alcohol withdrawal ([place]) 01/23/2014   Per patient report  . Shortness of breath   . Withdrawal seizures ([place]) 2014    Past Surgical History:  Procedure Laterality Date  . [place] STUDY Mark [name] 01/16/2015   Procedure: [place] STUDY;  Surgeon: Manus Gunning, MD;  Location: [facility] ENDOSCOPY;  Service: Gastroenterology;  Laterality: Mark [name];  . [place]  . FUNDOPLASTY TRANSTHORACIC  2003  . LEFT HEART CATHETERIZATION WITH CORONARY ANGIOGRAM Mark [name] 01/13/2014   Procedure: LEFT HEART CATHETERIZATION WITH CORONARY ANGIOGRAM;  Surgeon: Clent Demark, MD;  Location: [place] CATH LAB;  Service: Cardiovascular;  Laterality: Mark [name];  . NASAL SINUS SURGERY    . NECK SURGERY  2000  . [place] FUNDOPLICATION    . SUBACROMIAL DECOMPRESSION Right 11/16/2014   Procedure: RIGHT SHOULDER ARTHROSCOPY WITH SUBACROMIAL DECOMPRESSION ;  Surgeon: Leandrew Koyanagi, MD;  Location: [place];  Service: Orthopedics;  Laterality: Right;   Family History:  Family History  Problem Relation Age of Onset  . Lung cancer Father     was a smoker  . Alcohol abuse Father   . Hypertension Mother    . Colon polyps Mother   . Breast cancer Paternal Grandmother   . Alcohol abuse Paternal Grandfather     Social History: divorced, two adult children, lives with parents , on disability History  Alcohol Use  . 0.0 oz/week    Comment: occ     History  Drug Use No    Comment: Opana    Social History   Social History  . Marital status: Divorced    Spouse name: Mark [name]  . Number of children: 2  . Years of education: Mark [name]   Occupational History  . [employer]     Social History Main Topics  . Smoking status: Current Every Day Smoker    Packs/day: 0.50    Years: 30.00    Types: Cigarettes  . Smokeless tobacco: Never Used     Comment: 11-30-2014 info given  . Alcohol use 0.0 oz/week     Comment: occ  . Drug use: No     Comment: Opana  . Sexual activity: No   Other Topics Concern  . None   Social History Narrative  . None   Additional Social History:    Allergies:  Allergies  Allergen Reactions  . Cozaar [Losartan] Anaphylaxis  . Lisinopril Anaphylaxis    Labs:  Results for orders placed or performed during the hospital encounter of 11/07/15 (from the past 48 hour(s))  Basic metabolic panel     Status: Abnormal   Collection Time: 11/07/15  1:54 AM  Result Value Ref Range   Sodium 132 (L) 135 - 145 mmol/L   Potassium 3.3 (L) 3.5 - 5.1 mmol/L   Chloride 95 (L) 101 - 111 mmol/L   CO2 23 22 - 32 mmol/L   Glucose, Bld 111 (H) 65 - 99 mg/dL   BUN <5 (L) 6 - 20 mg/dL   Creatinine, Ser 0.70 0.61 - 1.24 mg/dL   Calcium 9.4 8.9 - 10.3 mg/dL   GFR calc non Af Amer >60 >60 mL/min   GFR calc Af Amer >60 >60 mL/min    Comment: (NOTE) The eGFR has been calculated using the CKD EPI equation. This calculation has not been validated in all clinical situations. eGFR's persistently <60 mL/min signify possible Chronic Kidney Disease.    Anion gap 14 5 - 15  CBC     Status: Abnormal   Collection Time: 11/07/15  1:54 AM  Result Value Ref Range   WBC 12.1 (H) 4.0 - 10.5 K/uL    RBC 6.26 (H) 4.22 - 5.81 MIL/uL   Hemoglobin 18.5 (H) 13.0 - 17.0 g/dL   HCT 50.1 39.0 - 52.0 %   MCV 80.0 78.0 - 100.0 fL   MCH 29.6 26.0 - 34.0 pg   MCHC 36.9 (H) 30.0 - 36.0 g/dL   RDW 12.8 11.5 - 15.5 %   Platelets 372 150 - 400 K/uL  Ethanol     Status: Abnormal   Collection Time: 11/07/15  1:54 AM  Result Value Ref Range   Alcohol, Ethyl (B) 268 (H) <5 mg/dL    Comment:        LOWEST DETECTABLE LIMIT FOR SERUM ALCOHOL IS 5 mg/dL FOR MEDICAL PURPOSES ONLY   Salicylate level     Status: None   Collection Time: 11/07/15  1:54 AM  Result Value Ref Range   Salicylate Lvl <9.9 2.8 - 30.0 mg/dL  Acetaminophen level     Status: Abnormal   Collection Time: 11/07/15  1:54 AM  Result Value Ref Range   Acetaminophen (Tylenol), Serum <10 (L) 10 - 30 ug/mL    Comment:        THERAPEUTIC CONCENTRATIONS VARY SIGNIFICANTLY. A RANGE OF 10-30 ug/mL MAY BE AN EFFECTIVE CONCENTRATION FOR MANY PATIENTS. HOWEVER, SOME ARE BEST TREATED AT CONCENTRATIONS OUTSIDE THIS RANGE. ACETAMINOPHEN CONCENTRATIONS >150 ug/mL AT 4 HOURS AFTER INGESTION AND >50 ug/mL AT 12 HOURS AFTER INGESTION ARE OFTEN ASSOCIATED WITH TOXIC REACTIONS.   Magnesium     Status: None   Collection Time: 11/07/15  1:54 AM  Result Value Ref Range   Magnesium 2.4 1.7 - 2.4 mg/dL  I-stat troponin, ED     Status: None   Collection Time: 11/07/15  2:14 AM  Result Value Ref Range   Troponin i, poc 0.00 0.00 - 0.08 ng/mL   Comment 3            Comment: Due to the release kinetics of cTnI, a negative result within the first hours of the onset of symptoms does not rule out myocardial infarction with certainty. If myocardial infarction is still suspected, repeat the test at appropriate intervals.   Troponin I (q 6hr x 3)     Status:  None   Collection Time: 11/07/15  6:58 AM  Result Value Ref Range   Troponin I <0.03 <0.03 ng/mL  Osmolality     Status: Abnormal   Collection Time: 11/07/15  6:58 AM  Result Value Ref  Range   Osmolality 322 (HH) 275 - 295 mOsm/kg    Comment: REPEATED TO VERIFY CRITICAL RESULT CALLED TO, READ BACK BY AND VERIFIED WITH: NICOLE MCKOY,MLT AT [place] AT 1144 11/07/15 BY ZBEECH. CRITICAL RESULT CALLED TO, READ BACK BY AND VERIFIED WITH: RYAN,K. RN '@[id number]'  ON 8.29.17 BY NMCCOY   Basic metabolic panel     Status: Abnormal   Collection Time: 11/07/15  7:00 AM  Result Value Ref Range   Sodium 136 135 - 145 mmol/L   Potassium 3.8 3.5 - 5.1 mmol/L   Chloride 101 101 - 111 mmol/L   CO2 24 22 - 32 mmol/L   Glucose, Bld 104 (H) 65 - 99 mg/dL   BUN <5 (L) 6 - 20 mg/dL   Creatinine, Ser 0.71 0.61 - 1.24 mg/dL   Calcium 9.0 8.9 - 10.3 mg/dL   GFR calc non Af Amer >60 >60 mL/min   GFR calc Af Amer >60 >60 mL/min    Comment: (NOTE) The eGFR has been calculated using the CKD EPI equation. This calculation has not been validated in all clinical situations. eGFR's persistently <60 mL/min signify possible Chronic Kidney Disease.    Anion gap 11 5 - 15  CBC     Status: None   Collection Time: 11/07/15  7:00 AM  Result Value Ref Range   WBC 6.3 4.0 - 10.5 K/uL   RBC 5.75 4.22 - 5.81 MIL/uL   Hemoglobin 16.5 13.0 - 17.0 g/dL   HCT 46.1 39.0 - 52.0 %   MCV 80.2 78.0 - 100.0 fL   MCH 28.7 26.0 - 34.0 pg   MCHC 35.8 30.0 - 36.0 g/dL   RDW 12.8 11.5 - 15.5 %   Platelets 352 150 - 400 K/uL  Magnesium     Status: Abnormal   Collection Time: 11/07/15  7:00 AM  Result Value Ref Range   Magnesium 2.5 (H) 1.7 - 2.4 mg/dL  Sodium, urine, random     Status: None   Collection Time: 11/07/15  7:54 AM  Result Value Ref Range   Sodium, Ur <10 mmol/L    Comment: Performed at [facility]  Osmolality, urine     Status: Abnormal   Collection Time: 11/07/15  7:55 AM  Result Value Ref Range   Osmolality, Ur 178 (L) 300 - 900 mOsm/kg    Comment: Performed at [facility]  Troponin I (q 6hr x 3)     Status: None   Collection Time: 11/07/15 12:50 PM  Result Value Ref Range    Troponin I <0.03 <0.03 ng/mL  MRSA PCR Screening     Status: None   Collection Time: 11/08/15  1:54 AM  Result Value Ref Range   MRSA by PCR NEGATIVE NEGATIVE    Comment:        The GeneXpert MRSA Assay (FDA approved for NASAL specimens only), is one component of a comprehensive MRSA colonization surveillance program. It is not intended to diagnose MRSA infection nor to guide or monitor treatment for MRSA infections.   Basic metabolic panel     Status: None   Collection Time: 11/08/15  3:23 AM  Result Value Ref Range   Sodium 137 135 - 145 mmol/L   Potassium 3.7 3.5 -  5.1 mmol/L   Chloride 104 101 - 111 mmol/L   CO2 26 22 - 32 mmol/L   Glucose, Bld 95 65 - 99 mg/dL   BUN 7 6 - 20 mg/dL   Creatinine, Ser 0.82 0.61 - 1.24 mg/dL   Calcium 9.0 8.9 - 10.3 mg/dL   GFR calc non Af Amer >60 >60 mL/min   GFR calc Af Amer >60 >60 mL/min    Comment: (NOTE) The eGFR has been calculated using the CKD EPI equation. This calculation has not been validated in all clinical situations. eGFR's persistently <60 mL/min signify possible Chronic Kidney Disease.    Anion gap 7 5 - 15  CBC     Status: None   Collection Time: 11/08/15  3:23 AM  Result Value Ref Range   WBC 8.6 4.0 - 10.5 K/uL   RBC 5.15 4.22 - 5.81 MIL/uL   Hemoglobin 14.3 13.0 - 17.0 g/dL   HCT 41.4 39.0 - 52.0 %   MCV 80.4 78.0 - 100.0 fL   MCH 27.8 26.0 - 34.0 pg   MCHC 34.5 30.0 - 36.0 g/dL   RDW 12.6 11.5 - 15.5 %   Platelets 278 150 - 400 K/uL  Magnesium     Status: None   Collection Time: 11/08/15  3:23 AM  Result Value Ref Range   Magnesium 2.3 1.7 - 2.4 mg/dL    Current Facility-Administered Medications  Medication Dose Route Frequency Provider Last Rate Last Dose  . acetaminophen (TYLENOL) tablet 650 mg  650 mg Oral Q6H PRN Edwin Dada, MD       Or  . acetaminophen (TYLENOL) suppository 650 mg  650 mg Rectal Q6H PRN Edwin Dada, MD      . enoxaparin (LOVENOX) injection 40 mg  40 mg  Subcutaneous Q24H Edwin Dada, MD   40 mg at 11/07/15 0940  . folic acid injection 1 mg  1 mg Intravenous Daily Javier Glazier, MD      . haloperidol lactate (HALDOL) injection 2 mg  2 mg Intravenous Q6H PRN Javier Glazier, MD      . LORazepam (ATIVAN) injection 0-4 mg  0-4 mg Intravenous Q6H Debbe Odea, MD   4 mg at 11/08/15 0103   Followed by  . [START ON 11/09/2015] LORazepam (ATIVAN) injection 0-4 mg  0-4 mg Intravenous Q12H Saima Rizwan, MD      . LORazepam (ATIVAN) tablet 1 mg  1 mg Oral Q6H PRN Debbe Odea, MD       Or  . LORazepam (ATIVAN) injection 1 mg  1 mg Intravenous Q6H PRN Debbe Odea, MD   1 mg at 11/07/15 2136  . metoprolol (LOPRESSOR) tablet 50 mg  50 mg Oral BID Edwin Dada, MD   50 mg at 11/07/15 2136  . multivitamin with minerals tablet 1 tablet  1 tablet Oral Daily Saima Rizwan, MD      . sodium chloride flush (NS) 0.9 % injection 3 mL  3 mL Intravenous Q12H Edwin Dada, MD   3 mL at 11/07/15 2137  . thiamine (VITAMIN B-1) tablet 100 mg  100 mg Oral Daily Debbe Odea, MD       Or  . thiamine (B-1) injection 100 mg  100 mg Intravenous Daily Debbe Odea, MD      . traMADol (ULTRAM) tablet 50 mg  50 mg Oral BID PRN Debbe Odea, MD   50 mg at 11/07/15 2236    Musculoskeletal: Strength & Muscle Tone:  within normal limits Gait & Station: unable to stand Patient leans: Mark [name]  Psychiatric Specialty Exam: Physical Exam  Review of Systems  Constitutional: Negative.   HENT: Negative.   Eyes: Positive for blurred vision.  Respiratory: Negative for cough and shortness of breath.   Cardiovascular: Negative.  Negative for chest pain (denies chest pain at this time and does not appear in any acute distress or discomfort ).  Gastrointestinal: Negative.   Genitourinary: Negative.   Musculoskeletal: Positive for back pain.  Skin: Negative for rash.  Neurological: Negative for seizures.  Endo/Heme/Allergies: Negative.    Psychiatric/Behavioral: Positive for substance abuse and suicidal ideas.    Blood pressure 137/87, pulse 66, temperature 97.6 F (36.4 C), temperature source Oral, resp. rate 15, height '6\' 4"'  (1.93 m), weight 218 lb (98.9 kg), SpO2 95 %.Body mass index is 26.54 kg/m.  General Appearance: Fairly Groomed- currently in hospital garb, in bed, drowsy, falls asleep often during session, but easily alertable by calling his name   Eye Contact:  Fair  Speech:  Slow  Volume:  Decreased  Mood:  denies depression at this time, states mood is " OK"   Affect:  blunted, not irritable or expansive at this time   Thought Process: slow,seems linear   Orientation:  Other:  partial orientation - knows he is in hospital, but identifies as [facility], knows day of week, , but misidentifies month  Thought Content:  denies hallucinations and does not appear internally preoccupied , no delusions expressed   Suicidal Thoughts:  No- at this time denies suicidal ideations, denies any self injurious ideations   Homicidal Thoughts:  No denies homicidal ideations  Memory:  recent poor, states he has little recollection of last night's events , long term grossly intact  Judgement:  Impaired  Insight:  Fair  Psychomotor Activity:  Normal- no current agitation, combativeness or restlessness, no current tremors   Concentration:  Concentration: Fair and Attention Span: Fair  Recall:  [employer] of Knowledge:  Fair  Language:  Fair  Akathisia:  Negative  Handed:  Right  AIMS (if indicated):     Assets:  No current abnormal involuntary movements  Noted, no tremors   ADL's:  Impaired  Cognition:  Impaired,  Mild  Sleep:      Assessment - patient has history of  Alcohol dependence, and had been drinking heavily for at least several days prior to admission, with an admission BAL of 268 yesterday. He has also been diagnosed with opiate dependence in the past, but does not endorse any recent opiate use  and there is no current  UDS  on record for this admission .He has a  History of mood disorder, and outpatient psychiatrist's notes indicate a diagnosis of Bipolar Disorder . He had a presentation of worsening agitation, significant aggression and  combativeness yesterday evening . This most likely related to alcohol , potentially emerging Delirium Tremens ( DTs). At this time calm, drowsy after receiving large doses of Ativan ( 16 mgrs) and Haldol . At this time denies depression and denies SI .   Treatment Plan Summary: Daily contact with patient to assess and evaluate symptoms and progress in treatment and Plan See below  Disposition: No evidence of imminent risk to self or others at present.    Continue Ativan detox protocol- agree with PRN Ativan rather than standing schedule, administration, due to his current sedation, drowsiness  Would not use Haldol unless severely agitated - this medication may lower seizure threshold,  already decreased in the context of alcohol WDL.  At this time would defer restarting his psychiatric medications ( Seroquel, Neurontin , Lamictal) until further stabilized and alert/attentive . Psychiatry will follow with you  Neita Garnet, MD 11/08/2015 9:15 AM

## 2015-11-08 NOTE — Progress Notes (Signed)
Patient transferred to ICU. Belongings sent with patient.

## 2015-11-08 NOTE — Progress Notes (Signed)
PROGRESS NOTE    Mark [name]  [id number] DOB: 04/13/1965 DOA: 11/07/2015 PCP: Minerva Ends, MD    Brief Narrative:  Mark [name] a 50 y.o.malewith a past medical history significant for 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   Assessment & Plan:   Nonsustained ventricular tachycardia - cont Metoprolol- Cardiology consulted - f/u ECHO results - ensure electrolyte are stable    Hypokalemia - Resolved after replacement  Suicidal ideation - Psychiatry consulted  Hyponatremia -Resolved    Essential hypertension - Lopressor    Tobacco abuse .- Continued counseling     Alcohol intoxication - Continue CIWA protocol    Chronic pain syndrome/   Neuropathy - Neurontin, Ultram   DVT prophylaxis: Lovenox Code Status: Full Family Communication: Spoke directly with patient Disposition Plan: Pending improvement in condition   Consultants:   Psychiatry  Cardiology  Critical care   Procedures: Echo   Antimicrobials: None   Subjective: Patient has no new complaints. No acute issues overnight  Objective: Vitals:   11/08/15 1003 11/08/15 1200 11/08/15 1233 11/08/15 1400  BP: (!) 171/98 (!) 166/92  (!) 146/80  Pulse: 76 71 74 80  Resp: 16 18  19   Temp:  97.5 F (36.4 C)    TempSrc:  Oral    SpO2: 95% 99%  97%  Weight:      Height:        Intake/Output Summary (Last 24 hours) at 11/08/15 1600 Last data filed at 11/08/15 0700  Gross per 24 hour  Intake                0 ml  Output             1000 ml  Net            -1000 ml   Filed Weights   11/07/15 0139  Weight: 98.9 kg (218 lb)    Examination:  General exam: Appears calm and comfortable , In no acute distress Respiratory system: Clear to auscultation. Respiratory effort normal. Cardiovascular system: S1 & S2 heard, RRR. No rubs or murmurs Gastrointestinal  system: Abdomen is nondistended, soft and nontender. No organomegaly or masses felt. Normal bowel sounds heard. Central nervous system: Alert and oriented. No focal neurological deficits. Extremities: Symmetric 5 x 5 power. Skin: No rashes, lesions or ulcers, limited exam Psychiatry:  Mood & affect appropriate.   Data Reviewed: I have personally reviewed following labs and imaging studies  CBC:  Recent Labs Lab 11/07/15 0154 11/07/15 0700 11/08/15 0323  WBC 12.1* 6.3 8.6  HGB 18.5* 16.5 14.3  HCT 50.1 46.1 41.4  MCV 80.0 80.2 80.4  PLT 372 352 0000000   Basic Metabolic Panel:  Recent Labs Lab 11/07/15 0154 11/07/15 0700 11/08/15 0323  NA 132* 136 137  K 3.3* 3.8 3.7  CL 95* 101 104  CO2 23 24 26   GLUCOSE 111* 104* 95  BUN <5* <5* 7  CREATININE 0.70 0.71 0.82  CALCIUM 9.4 9.0 9.0  MG 2.4 2.5* 2.3   GFR: Estimated Creatinine Clearance: 132.3 mL/min (by C-G formula based on SCr of 0.82 mg/dL). Liver Function Tests: No results for input(s): AST, ALT, ALKPHOS, BILITOT, PROT, ALBUMIN in the last 168 hours. No results for input(s): LIPASE, AMYLASE in the last 168 hours. No results for input(s): AMMONIA in the last 168 hours. Coagulation Profile: No results for input(s): INR, PROTIME in the  last 168 hours. Cardiac Enzymes:  Recent Labs Lab 11/07/15 0658 11/07/15 1250  TROPONINI <0.03 <0.03   BNP (last 3 results) No results for input(s): PROBNP in the last 8760 hours. HbA1C: No results for input(s): HGBA1C in the last 72 hours. CBG: No results for input(s): GLUCAP in the last 168 hours. Lipid Profile: No results for input(s): CHOL, HDL, LDLCALC, TRIG, CHOLHDL, LDLDIRECT in the last 72 hours. Thyroid Function Tests: No results for input(s): TSH, T4TOTAL, FREET4, T3FREE, THYROIDAB in the last 72 hours. Anemia Panel: No results for input(s): VITAMINB12, FOLATE, FERRITIN, TIBC, IRON, RETICCTPCT in the last 72 hours. Sepsis Labs: No results for input(s): PROCALCITON,  LATICACIDVEN in the last 168 hours.  Recent Results (from the past 240 hour(s))  MRSA PCR Screening     Status: None   Collection Time: 11/08/15  1:54 AM  Result Value Ref Range Status   MRSA by PCR NEGATIVE NEGATIVE Final    Comment:        The GeneXpert MRSA Assay (FDA approved for NASAL specimens only), is one component of a comprehensive MRSA colonization surveillance program. It is not intended to diagnose MRSA infection nor to guide or monitor treatment for MRSA infections.          Radiology Studies: Dg Chest 2 View  Result Date: 11/07/2015 CLINICAL DATA:  Upper chest pain and dyspnea for several weeks, worse tonight. EXAM: CHEST  2 VIEW COMPARISON:  05/25/2015 FINDINGS: The lungs are clear. The pulmonary vasculature is normal. Heart size is normal. Hilar and mediastinal contours are unremarkable. There is no pleural effusion. IMPRESSION: No active cardiopulmonary disease. Electronically Signed   By: Andreas Newport M.D.   On: 11/07/2015 02:22        Scheduled Meds: . enoxaparin (LOVENOX) injection  40 mg Subcutaneous Q24H  . folic acid  1 mg Intravenous Daily  . gabapentin  400 mg Oral TID  . LORazepam  0-4 mg Intravenous Q6H   Followed by  . [START ON 11/09/2015] LORazepam  0-4 mg Intravenous Q12H  . metoprolol  50 mg Oral BID  . multivitamin with minerals  1 tablet Oral Daily  . sodium chloride flush  3 mL Intravenous Q12H  . thiamine  100 mg Oral Daily   Or  . thiamine  100 mg Intravenous Daily   Continuous Infusions:    LOS: 0 days    Time spent: > 35 minutes    Velvet Bathe, MD [facility] Pager [phone]  If 7PM-7AM, please contact night-coverage [email or web address] Password [email or web address] 11/08/2015, 4:00 PM

## 2015-11-08 NOTE — Progress Notes (Signed)
Pt actively trying to leave; pulled fire alarm. Security and [employer] at bedside. MD paged, order given for Ativan 2mg  IV x1.

## 2015-11-08 NOTE — Progress Notes (Signed)
Patient pulling off restraints and trying to get OOB. Patient swinging arms violently at staff and security. Security in room with patient. NP contacted and prn ordered. Restraint sites checked and reappplied once patient calmed down. Will continue to monitor

## 2015-11-09 MED ORDER — LORAZEPAM 2 MG/ML IJ SOLN
2.0000 mg | INTRAMUSCULAR | Status: DC | PRN
  Administered 2015-11-09 – 2015-11-10 (×6): 2 mg via INTRAVENOUS
  Filled 2015-11-09 (×6): qty 1

## 2015-11-09 MED ORDER — ACETAMINOPHEN-CODEINE #3 300-30 MG PO TABS
2.0000 | ORAL_TABLET | ORAL | Status: DC | PRN
  Administered 2015-11-09: 2 via ORAL
  Administered 2015-11-09: 1 via ORAL
  Administered 2015-11-10 (×2): 2 via ORAL
  Filled 2015-11-09: qty 1
  Filled 2015-11-09 (×3): qty 2

## 2015-11-09 MED ORDER — NICOTINE 21 MG/24HR TD PT24
21.0000 mg | MEDICATED_PATCH | Freq: Every day | TRANSDERMAL | Status: DC
  Administered 2015-11-09: 21 mg via TRANSDERMAL
  Filled 2015-11-09: qty 1

## 2015-11-09 NOTE — Progress Notes (Signed)
Patient transferred from ICU department. Patient arrived with S[facility] at bedside. Room checked and suicide protocol followed for securing the room for safety. Patient is stable and has not complaints at this time. Patient denies suicidal ideations. No other changes from previous assessment.

## 2015-11-09 NOTE — Progress Notes (Signed)
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  Assessment & Plan:   Nonsustained ventricular tachycardia - cont Metoprolol- Cardiology consulted - f/u ECHO results - ensure electrolyte are stable    Hypokalemia - Resolved after replacement  Suicidal ideation - Psychiatry consulted and managing.  Hyponatremia - Resolved    Essential hypertension - Lopressor    Tobacco abuse .- Continued counseling     Alcohol intoxication - Continue CIWA protocol - Psychiatry assisting    Chronic pain syndrome/   Neuropathy - Neurontin, Ultram   DVT prophylaxis: Lovenox Code Status: Full Family Communication: Spoke directly with patient Disposition Plan: Pending final recommendations from psychiatrist    Consultants:   Psychiatry  Cardiology  Critical care   Procedures: Echo   Antimicrobials: None   Subjective: Patient reports he feels better  Objective: Vitals:   11/09/15 0500 11/09/15 0600 11/09/15 0800 11/09/15 0835  BP:  (!) 147/99 (!) 179/91 (!) 164/108  Pulse: 73 75 69 88  Resp:  17 20   Temp:      TempSrc:      SpO2: 94% 95% 94%   Weight:      Height:        Intake/Output Summary (Last 24 hours) at 11/09/15 0907 Last data filed at 11/08/15 1700  Gross per 24 hour  Intake              360 ml  Output              500 ml  Net             -140 ml   Filed Weights   11/07/15 0139  Weight: 98.9 kg (218 lb)    Examination:  General exam: Appears calm and comfortable , In no acute distress Respiratory system: Clear to auscultation. Respiratory effort normal. Cardiovascular system: S1 & S2 heard, RRR. No rubs or  murmurs Gastrointestinal system: Abdomen is nondistended, soft and nontender. No organomegaly or masses felt. Normal bowel sounds heard. Central nervous system: Alert and oriented. No focal neurological deficits. Extremities: Symmetric 5 x 5 power. Skin: No rashes, lesions or ulcers, limited exam Psychiatry:  Mood & affect appropriate.   Data Reviewed: I have personally reviewed following labs and imaging studies  CBC:  Recent Labs Lab 11/07/15 0154 11/07/15 0700 11/08/15 0323  WBC 12.1* 6.3 8.6  HGB 18.5* 16.5 14.3  HCT 50.1 46.1 41.4  MCV 80.0 80.2 80.4  PLT 372 352 0000000   Basic Metabolic Panel:  Recent Labs Lab 11/07/15 0154 11/07/15 0700 11/08/15 0323  NA 132* 136 137  K 3.3* 3.8 3.7  CL 95* 101 104  CO2 23 24 26   GLUCOSE 111* 104* 95  BUN <5* <5* 7  CREATININE 0.70 0.71 0.82  CALCIUM 9.4 9.0 9.0  MG 2.4 2.5* 2.3   GFR: Estimated Creatinine Clearance: 132.3 mL/min (by C-G formula based on SCr of 0.82 mg/dL). Liver Function Tests: No results for input(s): AST, ALT, ALKPHOS, BILITOT, PROT, ALBUMIN in the last 168 hours. No results for input(s): LIPASE, AMYLASE in the last 168 hours. No results for input(s): AMMONIA in the last 168 hours. Coagulation Profile: No results for input(s): INR, PROTIME in the  last 168 hours. Cardiac Enzymes:  Recent Labs Lab 11/07/15 0658 11/07/15 1250  TROPONINI <0.03 <0.03   BNP (last 3 results) No results for input(s): PROBNP in the last 8760 hours. HbA1C: No results for input(s): HGBA1C in the last 72 hours. CBG: No results for input(s): GLUCAP in the last 168 hours. Lipid Profile: No results for input(s): CHOL, HDL, LDLCALC, TRIG, CHOLHDL, LDLDIRECT in the last 72 hours. Thyroid Function Tests: No results for input(s): TSH, T4TOTAL, FREET4, T3FREE, THYROIDAB in the last 72 hours. Anemia Panel: No results for input(s): VITAMINB12, FOLATE, FERRITIN, TIBC, IRON, RETICCTPCT in the last 72 hours. Sepsis Labs: No results  for input(s): PROCALCITON, LATICACIDVEN in the last 168 hours.  Recent Results (from the past 240 hour(s))  MRSA PCR Screening     Status: None   Collection Time: 11/08/15  1:54 AM  Result Value Ref Range Status   MRSA by PCR NEGATIVE NEGATIVE Final    Comment:        The GeneXpert MRSA Assay (FDA approved for NASAL specimens only), is one component of a comprehensive MRSA colonization surveillance program. It is not intended to diagnose MRSA infection nor to guide or monitor treatment for MRSA infections.          Radiology Studies: No results found.      Scheduled Meds: . enoxaparin (LOVENOX) injection  40 mg Subcutaneous Q24H  . folic acid  1 mg Intravenous Daily  . gabapentin  400 mg Oral TID  . LORazepam  0-4 mg Intravenous Q6H   Followed by  . LORazepam  0-4 mg Intravenous Q12H  . metoprolol  50 mg Oral BID  . multivitamin with minerals  1 tablet Oral Daily  . sodium chloride flush  3 mL Intravenous Q12H  . thiamine  100 mg Oral Daily   Or  . thiamine  100 mg Intravenous Daily   Continuous Infusions:    LOS: 1 day    Time spent: > 35 minutes    Mark Bathe, MD [facility] Pager [phone]  If 7PM-7AM, please contact night-coverage [email or web address] Password [email or web address] 11/09/2015, 9:07 AM

## 2015-11-10 MED ORDER — ZOLPIDEM TARTRATE 5 MG PO TABS
5.0000 mg | ORAL_TABLET | Freq: Once | ORAL | Status: AC
  Administered 2015-11-10: 5 mg via ORAL
  Filled 2015-11-10: qty 1

## 2015-11-10 MED ORDER — ADULT MULTIVITAMIN W/MINERALS CH: 1.0000 | ORAL_TABLET | Freq: Every day | ORAL | Status: DC

## 2015-11-10 MED ORDER — FOLIC ACID 1 MG PO TABS
1.0000 mg | ORAL_TABLET | Freq: Every day | ORAL | Status: DC
  Administered 2015-11-10: 1 mg via ORAL
  Filled 2015-11-10: qty 1

## 2015-11-10 MED ORDER — CHLORDIAZEPOXIDE HCL 25 MG PO CAPS: 25.0000 mg | ORAL_CAPSULE | Freq: Three times a day (TID) | ORAL | 0 refills | Status: DC | PRN

## 2015-11-10 NOTE — Consult Note (Signed)
[place] Psychiatry Consult   Reason for Consult:  Reassessment for disposition  Referring Physician:  Dr. Wendee Beavers Patient Identification: Mark [name] MRN:  [id number] Principal Diagnosis: Suicidal ideation Diagnosis:   Patient Active Problem List   Diagnosis Date Noted  . Suicidal ideation [R45.851] 11/07/2015  . Bipolar affective disorder, current episode depressed ([place]) [F31.30]   . Non-sustained ventricular tachycardia ([place]) [I47.2]   . GERD (gastroesophageal reflux disease) [K21.9] 07/24/2015  . Chronic anxiety [F41.9] 12/19/2014  . Tender prostate [N42.81] 09/16/2014  . Erectile dysfunction [N52.9] 09/16/2014  . Poor dentition [K08.9] 07/28/2014  . Chronic maxillary sinusitis [J32.0] 07/28/2014  . Vitamin D insufficiency [E55.9] 07/05/2014  . Closed fracture of 5th metacarpal [S62.308A] 07/04/2014  . Chronic fatigue [R53.82] 07/04/2014  . Chronic radicular low back pain [M54.10, G89.29] 06/02/2014  . Pain in joint, shoulder region [M25.519] 05/12/2014  . Bipolar depression ([place]) [F31.30] 02/08/2014  . Neuropathy ([place]) [G62.9] 02/08/2014  . Long Q-T syndrome [I45.81] 01/23/2014  . Hypokalemia [E87.6]   . Opioid dependence ([place]) [F11.20] 01/22/2014    Class: Acute  . Paroxysmal VT ([place]) [I47.2] 12/19/2013  . Chronic pain syndrome [G89.4] 12/28/2012  . Alcohol intoxication ([place]) [F10.129] 06/09/2012  . 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 Nonsustained ventricular tachycardia ([place]) [I47.2] 01/20/2012  . Tobacco abuse [Z72.0] 01/19/2012  . Essential hypertension [I10]   . Hyperlipidemia [E78.5]     Total Time spent with patient: 20 minutes  Subjective:   Mark [name] is a 50 y.o. male patient admitted with daily heavy alcohol consumption, chest pain, suicidal ideations.  HPI: (Please refer to initial psychiatric consultation dated 8/30 for further details.) Mark [name] is a 50 year old male, has history of alcohol  dependence, was admitted due to chest pain, heavy drinking x several days prior to admission . Admission BAL 268. Marland Kitchen He also reported passive suicidal ideations in the ED . He was initially combative, agitated , and required high doses of Ativan to address .  Patient is currently much improved- he is calm, pleasant on approach, denies depression, affect is appropriate, reactive, denies any suicidal ideations, denies any self injurious ideations, and is future oriented. At this time he is not presenting with any alcohol withdrawal symptoms- no tremors, no diaphoresis, no restlessness, vitals are stable .  He reports history of anxiety, worry, but states he is feeling better at this time, and Neurontin may be helping to address anxiety   Past Psychiatric History: has been diagnosed with Bipolar disorder in the past, history of alcohol and opiate dependencies. Prior psychiatric medications , most recently 2015.   Risk to Self: Is patient at risk for suicide?: Yes Risk to Others:   Prior Inpatient Therapy:   Prior Outpatient Therapy:    Past Medical History:  Past Medical History:  Diagnosis Date  . Alcohol withdrawal ([place])   . Anxiety Dx 2014  . COPD (chronic obstructive pulmonary disease) ([place])   . Drug abuse    pt reports opioid dependence due to previous back surgeries  . ETOH abuse   . GERD (gastroesophageal reflux disease) Dx 2003  . Headache(784.0)   . Hepatitis   . Hyperlipidemia Dx 2000  . Hypertension Dx 2011  . Irregular heart beat   . 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 Mental disorder   . Neuropathy ([place]) 06/07/2013  . Seizure due to alcohol withdrawal ([place]) 01/23/2014   Per patient report  . Shortness of breath   . Withdrawal seizures (  [place]) 2014    Past Surgical History:  Procedure Laterality Date  . [place] STUDY N/A 01/16/2015   Procedure: [place] STUDY;  Surgeon: Manus Gunning, MD;  Location: [facility];  Service: Gastroenterology;  Laterality: N/A;  .  [place]  . FUNDOPLASTY TRANSTHORACIC  2003  . LEFT HEART CATHETERIZATION WITH CORONARY ANGIOGRAM N/A 01/13/2014   Procedure: LEFT HEART CATHETERIZATION WITH CORONARY ANGIOGRAM;  Surgeon: Clent Demark, MD;  Location: [facility];  Service: Cardiovascular;  Laterality: N/A;  . NASAL SINUS SURGERY    . NECK SURGERY  2000  . [place] FUNDOPLICATION    . SUBACROMIAL DECOMPRESSION Right 11/16/2014   Procedure: RIGHT SHOULDER ARTHROSCOPY WITH SUBACROMIAL DECOMPRESSION ;  Surgeon: Leandrew Koyanagi, MD;  Location: [facility];  Service: Orthopedics;  Laterality: Right;   Family History:  Family History  Problem Relation Age of Onset  . Lung cancer Father     was a smoker  . Alcohol abuse Father   . Hypertension Mother   . Colon polyps Mother   . Breast cancer Paternal Grandmother   . Alcohol abuse Paternal Grandfather    Family Psychiatric  History:  Social History: divorced, two adult children, lives with parents, states applying for disability  History  Alcohol Use  . 0.0 oz/week    Comment: occ     History  Drug Use No    Comment: Opana    Social History   Social History  . Marital status: Divorced    Spouse name: N/A  . Number of children: 2  . Years of education: N/A   Occupational History  . [employer]     Social History Main Topics  . Smoking status: Current Every Day Smoker    Packs/day: 0.50    Years: 30.00    Types: Cigarettes  . Smokeless tobacco: Never Used     Comment: 11-30-2014 info given  . Alcohol use 0.0 oz/week     Comment: occ  . Drug use: No     Comment: Opana  . Sexual activity: No   Other Topics Concern  . None   Social History Narrative  . None   Additional Social History:    Allergies:   Allergies  Allergen Reactions  . Cozaar [Losartan] Anaphylaxis  . Lisinopril Anaphylaxis    Labs: No results found for this or any previous visit (from the past 48 hour(s)).  Current Facility-Administered Medications   Medication Dose Route Frequency Provider Last Rate Last Dose  . acetaminophen (TYLENOL) tablet 650 mg  650 mg Oral Q6H PRN Edwin Dada, MD   650 mg at 11/09/15 [id number]   Or  . acetaminophen (TYLENOL) suppository 650 mg  650 mg Rectal Q6H PRN Edwin Dada, MD      . acetaminophen-codeine (TYLENOL #3) 300-30 MG per tablet 2 tablet  2 tablet Oral Q4H PRN Velvet Bathe, MD   2 tablet at 11/10/15 [id number]  . enoxaparin (LOVENOX) injection 40 mg  40 mg Subcutaneous Q24H Edwin Dada, MD   40 mg at 11/10/15 0924  . folic acid (FOLVITE) tablet 1 mg  1 mg Oral Daily Velvet Bathe, MD   1 mg at 11/10/15 0923  . gabapentin (NEURONTIN) capsule 400 mg  400 mg Oral TID Rigoberto Noel, MD   400 mg at 11/10/15 0923  . haloperidol lactate (HALDOL) injection 2 mg  2 mg Intravenous Q6H PRN Javier Glazier, MD   2  mg at 11/09/15 0240  . LORazepam (ATIVAN) injection 0-4 mg  0-4 mg Intravenous Q12H Debbe Odea, MD   2 mg at 11/10/15 [id number]  . LORazepam (ATIVAN) injection 2 mg  2 mg Intravenous Q3H PRN Velvet Bathe, MD   2 mg at 11/10/15 1251  . metoprolol (LOPRESSOR) tablet 50 mg  50 mg Oral BID Edwin Dada, MD   50 mg at 11/10/15 0923  . multivitamin with minerals tablet 1 tablet  1 tablet Oral Daily Debbe Odea, MD   1 tablet at 11/10/15 0923  . nicotine (NICODERM CQ - dosed in mg/24 hours) patch 21 mg  21 mg Transdermal QHS Gardiner Barefoot, NP   21 mg at 11/09/15 2258  . sodium chloride flush (NS) 0.9 % injection 3 mL  3 mL Intravenous Q12H Edwin Dada, MD   3 mL at 11/10/15 0929  . thiamine (VITAMIN B-1) tablet 100 mg  100 mg Oral Daily Debbe Odea, MD   100 mg at 11/10/15 0923  . traMADol (ULTRAM) tablet 50 mg  50 mg Oral BID PRN Debbe Odea, MD   50 mg at 11/10/15 0034    Musculoskeletal: Strength & Muscle Tone: within normal limits- no tremors, no diaphoresis, no restlessness  Gait & Station: normal Patient leans: N/A  Psychiatric Specialty Exam: Physical  Exam  ROS no headache, no chest pain, no shortness of breath, no nausea or vomiting   Blood pressure (!) 146/93, pulse 78, temperature 97.9 F (36.6 C), temperature source Oral, resp. rate 18, height 6\' 4"  (1.93 m), weight 218 lb (98.9 kg), SpO2 98 %.Body mass index is 26.54 kg/m.  General Appearance: Well Groomed  Eye Contact:  Good  Speech:  Normal Rate  Volume:  Normal  Mood:  states mood is "OK", denies depression   Affect:  Appropriate and reactive   Thought Process:  Linear  Orientation:  Full (Time, Place, and Person)  Thought Content:  no hallucinations , no delusions, not internally preoccupied   Suicidal Thoughts:  No- at this time patient denies any suicidal ideations , denies any self injurious ideations, denies any homicidal or violent ideations   Homicidal Thoughts:  No  Memory:  recent and remote grossly intact   Judgement:  Other:  improved   Insight:  improved   Psychomotor Activity:  Normal as noted, no current restlessness, no tremors, no agitation   Concentration:  Concentration: Good and Attention Span: Good  Recall:  Good  Fund of Knowledge:  Good  Language:  Good  Akathisia:  Negative  Handed:  Right  AIMS (if indicated):     Assets:  Desire for Improvement Resilience  ADL's:  Intact  Cognition:  WNL  Sleep:      Assessment - patient currently improved, denies feeling depressed, affect is improved, and has no suicidal ideations  He is not presenting with any residual or ongoing symptoms of alcohol withdrawal .  Treatment Plan Summary: see below   Disposition: No evidence of imminent risk to self or others at present.   Patient does not meet criteria for psychiatric inpatient admission. Patient encouraged to avoid alcohol completely, to consider going to [facility] meetings regularly, and to continue outpatient treatment . Of note, patient not interested in referral to a residential rehab setting at this time. States he plans to return home .  Please include  referral to  outpatient psychiatric services and therapy as part of disposition plans - patient was seeing Dr. Adele Schilder at [facility] outpatient clinic ,  prior to this admission   Neita Garnet, MD 11/10/2015 1:53 PM

## 2015-11-10 NOTE — Discharge Summary (Signed)
Physician Discharge Summary  Mark [name] [id number] DOB: 1965-04-10 DOA: 11/07/2015  PCP: Minerva Ends, MD  Admit date: 11/07/2015 Discharge date: 11/10/2015  Time spent: > 35 minutes  Recommendations for Outpatient Follow-up:  1. Please be sure to follow up with your primary care physician in 1-2 weeks or sooner should any new concerns arise.    Discharge Diagnoses:  Principal Problem:   Suicidal ideation Active Problems:   Essential hypertension   Tobacco abuse   Nonsustained ventricular tachycardia ([name])   Alcohol intoxication ([name])   Chronic pain syndrome   Hypokalemia   Bipolar depression ([name])   Neuropathy ([name])   Bipolar affective disorder, current episode depressed ([place])   Non-sustained ventricular tachycardia ([place])   Discharge Condition:  Stable  Diet recommendation: low sodium diet  Filed Weights   11/07/15 0139  Weight: 98.9 kg (218 lb)    History of present illness:  Mark [name] a 50 y.o.malewith a past medical history significant for 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  Hospital Course:  Chest pain - resolved. Cardiology evaluated in house and recommended no further cardiac work up  [place] - evaluated by Psychiatry and on day of discharge patient had no imminent risk to self or others and didn't meet inpatient psychiatric admission.  Alcoholic withdrawal - will d/c with librium for 3 more day supply. Patient may take benadryl or hydroxyzine afterwards. He has no complaints of anxiety on exam and requesting discharge.   For other known medical conditions listed above will continue medication regimen listed below.   Procedures:  None  Consultations:  Psychiatry  Cardiology  Discharge Exam: Vitals:   11/09/15 2047 11/10/15 0506  BP: (!) 162/96 (!) 146/93  Pulse: 73 78  Resp: 18 18  Temp: 98.3 F (36.8 C) 97.9  F (36.6 C)    General: Pt in nad, alert and awake Cardiovascular: rrr, no rubs Respiratory: no increased wob, no wheezes  Discharge Instructions   Discharge Instructions    Call MD for:  extreme fatigue    Complete by:  As directed   Call MD for:  redness, tenderness, or signs of infection (pain, swelling, redness, odor or green/yellow discharge around incision site)    Complete by:  As directed   Call MD for:  temperature >100.4    Complete by:  As directed   Diet - low sodium heart healthy    Complete by:  As directed   Discharge instructions    Complete by:  As directed   Please abstain from any alcohol consumption.  Also f/u with your primary care physician in 1-2 weeks or sooner should any new concerns arise.   Increase activity slowly    Complete by:  As directed     Current Discharge Medication List    START taking these medications   Details  chlordiazePOXIDE (LIBRIUM) 25 MG capsule Take 1 capsule (25 mg total) by mouth 3 (three) times daily as needed for anxiety. Qty: 10 capsule, Refills: 0    Multiple Vitamin (MULTIVITAMIN WITH MINERALS) TABS tablet Take 1 tablet by mouth daily.      CONTINUE these medications which have NOT CHANGED   Details  acetaminophen-codeine (TYLENOL #3) 300-30 MG tablet Take 2 tablets by mouth every 4 (four) hours as needed for moderate pain.    cyclobenzaprine (FLEXERIL) 10 MG tablet Take 1 tablet (10 mg total) by mouth at bedtime  as needed for muscle spasms. Qty: 60 tablet, Refills: 5   Associated Diagnoses: Chronic pain syndrome; Neuropathy ([name])    dicyclomine (BENTYL) 20 MG tablet Take 1 tablet (20 mg total) by mouth 3 (three) times daily as needed for spasms. Qty: 90 tablet, Refills: 1    gabapentin (NEURONTIN) 800 MG tablet Take 800 mg by mouth 3 (three) times daily.    metoprolol (LOPRESSOR) 50 MG tablet Take 1 tablet (50 mg total) by mouth 2 (two) times daily. Qty: 60 tablet, Refills: 11   Associated Diagnoses: Essential  hypertension    QUEtiapine (SEROQUEL) 25 MG tablet Take 25 mg by mouth at bedtime as needed (for sleep).    tadalafil (CIALIS) 20 MG tablet Take 0.5-1 tablets (10-20 mg total) by mouth every other day as needed for erectile dysfunction. Qty: 30 tablet, Refills: 3   Associated Diagnoses: Erectile dysfunction, unspecified erectile dysfunction type    traMADol (ULTRAM) 50 MG tablet Take 50 mg by mouth 2 (two) times daily as needed for moderate pain.       Allergies  Allergen Reactions  . Cozaar [Losartan] Anaphylaxis  . Lisinopril Anaphylaxis      The results of significant diagnostics from this hospitalization (including imaging, microbiology, ancillary and laboratory) are listed below for reference.    Significant Diagnostic Studies: Dg Chest 2 View  Result Date: 11/07/2015 CLINICAL DATA:  Upper chest pain and dyspnea for several weeks, worse tonight. EXAM: CHEST  2 VIEW COMPARISON:  05/25/2015 FINDINGS: The lungs are clear. The pulmonary vasculature is normal. Heart size is normal. Hilar and mediastinal contours are unremarkable. There is no pleural effusion. IMPRESSION: No active cardiopulmonary disease. Electronically Signed   By: Andreas Newport M.D.   On: 11/07/2015 02:22    Microbiology: Recent Results (from the past 240 hour(s))  MRSA PCR Screening     Status: None   Collection Time: 11/08/15  1:54 AM  Result Value Ref Range Status   MRSA by PCR NEGATIVE NEGATIVE Final    Comment:        The GeneXpert MRSA Assay (FDA approved for NASAL specimens only), is one component of a comprehensive MRSA colonization surveillance program. It is not intended to diagnose MRSA infection nor to guide or monitor treatment for MRSA infections.      Labs: Basic Metabolic Panel:  Recent Labs Lab 11/07/15 0154 11/07/15 0700 11/08/15 0323  NA 132* 136 137  K 3.3* 3.8 3.7  CL 95* 101 104  CO2 23 24 26   GLUCOSE 111* 104* 95  BUN <5* <5* 7  CREATININE 0.70 0.71 0.82   CALCIUM 9.4 9.0 9.0  MG 2.4 2.5* 2.3   Liver Function Tests: No results for input(s): AST, ALT, ALKPHOS, BILITOT, PROT, ALBUMIN in the last 168 hours. No results for input(s): LIPASE, AMYLASE in the last 168 hours. No results for input(s): AMMONIA in the last 168 hours. CBC:  Recent Labs Lab 11/07/15 0154 11/07/15 0700 11/08/15 0323  WBC 12.1* 6.3 8.6  HGB 18.5* 16.5 14.3  HCT 50.1 46.1 41.4  MCV 80.0 80.2 80.4  PLT 372 352 278   Cardiac Enzymes:  Recent Labs Lab 11/07/15 0658 11/07/15 1250  TROPONINI <0.03 <0.03   BNP: BNP (last 3 results)  Recent Labs  02/27/15 1150  BNP 23.9    ProBNP (last 3 results) No results for input(s): PROBNP in the last 8760 hours.  CBG: No results for input(s): GLUCAP in the last 168 hours.   Signed:  Velvet Bathe MD.  [facility] 11/10/2015, 2:28 PM

## 2015-11-10 NOTE — Progress Notes (Signed)
Date: November 10, 2015 Discharge orders checked for needs. No needs present at time of discharge. Velva Harman, RN, BSN, [place]   [phone]

## 2015-11-10 NOTE — Progress Notes (Signed)
LCSWA met briefly with patient in hall. Patient ambulating well in the hall. Patient reports he is in a positive mood.  Patient reports he looking forward to going home.  Patient reports no concerns at this time.  

## 2015-11-12 ENCOUNTER — Emergency Department (HOSPITAL_COMMUNITY): Payer: No Typology Code available for payment source

## 2015-11-12 ENCOUNTER — Emergency Department (HOSPITAL_COMMUNITY)
Admission: EM | Admit: 2015-11-12 | Discharge: 2015-11-13 | Discharge status: Against Medical Advice | Payer: No Typology Code available for payment source | Attending: Emergency Medicine | Admitting: Emergency Medicine

## 2015-11-12 ENCOUNTER — Encounter (HOSPITAL_COMMUNITY): Payer: Self-pay | Admitting: Emergency Medicine

## 2015-11-12 DIAGNOSIS — Z79899 Other long term (current) drug therapy: Secondary | ICD-10-CM | POA: Insufficient documentation

## 2015-11-12 DIAGNOSIS — I10 Essential (primary) hypertension: Secondary | ICD-10-CM | POA: Insufficient documentation

## 2015-11-12 DIAGNOSIS — J449 Chronic obstructive pulmonary disease, unspecified: Secondary | ICD-10-CM | POA: Insufficient documentation

## 2015-11-12 DIAGNOSIS — I493 Ventricular premature depolarization: Secondary | ICD-10-CM | POA: Insufficient documentation

## 2015-11-12 DIAGNOSIS — G8929 Other chronic pain: Secondary | ICD-10-CM | POA: Insufficient documentation

## 2015-11-12 DIAGNOSIS — F17210 Nicotine dependence, cigarettes, uncomplicated: Secondary | ICD-10-CM | POA: Insufficient documentation

## 2015-11-12 DIAGNOSIS — R079 Chest pain, unspecified: Secondary | ICD-10-CM

## 2015-11-12 LAB — BASIC METABOLIC PANEL
Anion gap: 10 (ref 5–15)
BUN: 9 mg/dL (ref 6–20)
CHLORIDE: 101 mmol/L (ref 101–111)
CO2: 24 mmol/L (ref 22–32)
CREATININE: 0.9 mg/dL (ref 0.61–1.24)
Calcium: 9.5 mg/dL (ref 8.9–10.3)
GFR calc Af Amer: >60 mL/min (ref >60)
GLUCOSE: 109 mg/dL — AB (ref 65–99)
Potassium: 5.4 mmol/L — ABNORMAL HIGH (ref 3.5–5.1)
SODIUM: 135 mmol/L (ref 135–145)

## 2015-11-12 LAB — CBC
HCT: 48.5 % (ref 39.0–52.0)
Hemoglobin: 17.4 g/dL — ABNORMAL HIGH (ref 13.0–17.0)
MCH: 29.3 pg (ref 26.0–34.0)
MCHC: 35.9 g/dL (ref 30.0–36.0)
MCV: 81.8 fL (ref 78.0–100.0)
PLATELETS: 351 10*3/uL (ref 150–400)
RBC: 5.93 MIL/uL — ABNORMAL HIGH (ref 4.22–5.81)
RDW: 13.3 % (ref 11.5–15.5)
WBC: 13 10*3/uL — AB (ref 4.0–10.5)

## 2015-11-12 LAB — I-STAT TROPONIN, ED: Troponin i, poc: 0 ng/mL (ref 0.00–0.08)

## 2015-11-12 MED ORDER — METOPROLOL TARTRATE 5 MG/5ML IV SOLN
2.5000 mg | Freq: Once | INTRAVENOUS | Status: AC
  Administered 2015-11-12: 2.5 mg via INTRAVENOUS
  Filled 2015-11-12: qty 5

## 2015-11-12 MED ORDER — SODIUM CHLORIDE 0.9 % IV BOLUS (SEPSIS)
1000.0000 mL | Freq: Once | INTRAVENOUS | Status: AC
  Administered 2015-11-12: 1000 mL via INTRAVENOUS

## 2015-11-12 NOTE — ED Notes (Signed)
Madison Hickman [phone]. Pt stepfather states he can pick pt up when discharged.

## 2015-11-12 NOTE — ED Provider Notes (Signed)
[facility] DEPT Provider Note   CSN: [id number] Arrival date & time: 11/12/15  2247     History   Chief Complaint Chief Complaint  Patient presents with  . Chest Pain    HPI Mark [name] is a 50 y.o. male.  The history is provided by the patient.  Chest Pain   This is a recurrent problem. The current episode started 12 to 24 hours ago. The problem occurs constantly. The problem has not changed since onset.The pain is associated with rest. The pain is present in the substernal region. The pain does not radiate. Associated symptoms include palpitations. Pertinent negatives include no abdominal pain, no diaphoresis, no hemoptysis, no syncope and no weakness. Associated symptoms comments: "bees in the head" and flickering. He has tried nothing for the symptoms. The treatment provided no relief. Risk factors include alcohol intake.  Pertinent negatives for past medical history include no aneurysm and no valve disorder.  Pertinent negatives for family medical history include: no aortic dissection.  Procedure history is negative for cardiac catheterization.  Also has chronic pain all over is not taking his metoprolol.  No AH or VH no AH or VH  Past Medical History:  Diagnosis Date  . Alcohol withdrawal ([place])   . Anxiety Dx 2014  . COPD (chronic obstructive pulmonary disease) ([place])   . Drug abuse    pt reports opioid dependence due to previous back surgeries  . ETOH abuse   . GERD (gastroesophageal reflux disease) Dx 2003  . Headache(784.0)   . Hepatitis   . Hyperlipidemia Dx 2000  . Hypertension Dx 2011  . Irregular heart beat   . 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 Mental disorder   . Neuropathy ([place]) 06/07/2013  . Seizure due to alcohol withdrawal ([place]) 01/23/2014   Per patient report  . Shortness of breath   . Withdrawal seizures [facility]) 2014    Patient Active Problem List   Diagnosis Date Noted  . Suicidal ideation 11/07/2015  . Bipolar affective disorder, current  episode depressed ([place])   . Non-sustained ventricular tachycardia ([place])   . GERD (gastroesophageal reflux disease) 07/24/2015  . Chronic anxiety 12/19/2014  . 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 Erectile dysfunction 09/16/2014  . Poor dentition 07/28/2014  . Chronic maxillary sinusitis 07/28/2014  . Vitamin D insufficiency 07/05/2014  . Closed fracture of 5th metacarpal 07/04/2014  . Chronic fatigue 07/04/2014  . Chronic radicular low back pain 06/02/2014  . Pain in joint, shoulder region 05/12/2014  . Bipolar depression ([place]) 02/08/2014  . Neuropathy ([place]) 02/08/2014  . 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 Hypokalemia   . Opioid dependence ([place]) 01/22/2014    Class: Acute  . Paroxysmal VT ([place]) 12/19/2013  . Chronic pain syndrome 12/28/2012  . Alcohol intoxication ([place]) 06/09/2012  . 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 Nonsustained ventricular tachycardia ([place]) 01/20/2012  . Tobacco abuse 01/19/2012  . Essential hypertension   . Hyperlipidemia     Past Surgical History:  Procedure Laterality Date  . [place] STUDY N/A 01/16/2015   Procedure: [place] STUDY;  Surgeon: Manus Gunning, MD;  Location: [facility] ENDOSCOPY;  Service: Gastroenterology;  Laterality: N/A;  . [place]  . FUNDOPLASTY TRANSTHORACIC  2003  . LEFT HEART CATHETERIZATION WITH CORONARY ANGIOGRAM N/A 01/13/2014   Procedure: LEFT HEART CATHETERIZATION WITH CORONARY ANGIOGRAM;  Surgeon: Clent Demark, MD;  Location: [facility] CATH LAB;  Service: Cardiovascular;  Laterality: N/A;  . NASAL SINUS  SURGERY    . NECK SURGERY  2000  . [place]ISS[place] FUNDOPLICATION    . SUBACROMIAL DECOMPRESSION Right 11/16/2014   Procedure: RIGHT SHOULDER ARTHROSCOPY WITH SUBACROMIAL DECOMPRESSION ;  Surgeon: Leandrew Koyanagi, MD;  Location: [facility];  Service: Orthopedics;  Laterality: Right;       Home Medications    Prior to Admission medications   Medication Sig Start Date End Date  Taking? Authorizing Provider  acetaminophen-codeine (TYLENOL #3) 300-30 MG tablet Take 2 tablets by mouth every 4 (four) hours as needed for moderate pain.    Historical Provider, MD  chlordiazePOXIDE (LIBRIUM) 25 MG capsule Take 1 capsule (25 mg total) by mouth 3 (three) times daily as needed for anxiety. 11/10/15   Velvet Bathe, MD  cyclobenzaprine (FLEXERIL) 10 MG tablet Take 1 tablet (10 mg total) by mouth at bedtime as needed for muscle spasms. 07/24/15   Josalyn Funches, MD  dicyclomine (BENTYL) 20 MG tablet Take 1 tablet (20 mg total) by mouth 3 (three) times daily as needed for spasms. 07/25/15   Manus Gunning, MD  gabapentin (NEURONTIN) 800 MG tablet Take 800 mg by mouth 3 (three) times daily.    Historical Provider, MD  metoprolol (LOPRESSOR) 50 MG tablet Take 1 tablet (50 mg total) by mouth 2 (two) times daily. 07/24/15   Boykin Nearing, MD  Multiple Vitamin (MULTIVITAMIN WITH MINERALS) TABS tablet Take 1 tablet by mouth daily. 11/10/15   Velvet Bathe, MD  QUEtiapine (SEROQUEL) 25 MG tablet Take 25 mg by mouth at bedtime as needed (for sleep).    Historical Provider, MD  tadalafil (CIALIS) 20 MG tablet Take 0.5-1 tablets (10-20 mg total) by mouth every other day as needed for erectile dysfunction. 10/26/15   Josalyn Funches, MD  traMADol (ULTRAM) 50 MG tablet Take 50 mg by mouth 2 (two) times daily as needed for moderate pain.    Historical Provider, MD    Family History Family History  Problem Relation Age of Onset  . Lung cancer Father     was a smoker  . Alcohol abuse Father   . Hypertension Mother   . Colon polyps Mother   . Breast cancer Paternal Grandmother   . Alcohol abuse Paternal Grandfather     Social History Social History  Substance Use Topics  . Smoking status: Current Every Day Smoker    Packs/day: 0.50    Years: 30.00    Types: Cigarettes  . Smokeless tobacco: Never Used     Comment: 11-30-2014 info given  . Alcohol use 0.0 oz/week     Comment: occ      Allergies   Cozaar [losartan] and Lisinopril   Review of Systems Review of Systems  Constitutional: Negative for diaphoresis.  Respiratory: Negative for hemoptysis and wheezing.   Cardiovascular: Positive for chest pain and palpitations. Negative for syncope.  Gastrointestinal: Negative for abdominal pain.  Neurological: Negative for weakness.  Psychiatric/Behavioral: Positive for dysphoric mood.  All other systems reviewed and are negative.    Physical Exam Updated Vital Signs BP (!) 149/105 (BP Location: Left Arm)   Pulse 105   Resp 20   Ht 6\' 4"  (1.93 m)   Wt 220 lb (99.8 kg)   SpO2 98%   BMI 26.78 kg/m   Physical Exam  Constitutional: He is oriented to person, place, and time. He appears well-developed and well-nourished. No distress.  HENT:  Head: Normocephalic and atraumatic.  Eyes: EOM are normal. Pupils are equal, round, and reactive to  light.  Neck: Normal range of motion. Neck supple.  Cardiovascular: Normal rate and regular rhythm.   Pulmonary/Chest: Effort normal and breath sounds normal. He has no wheezes.  Abdominal: Soft. Bowel sounds are normal. He exhibits no mass. There is no tenderness. There is no rebound and no guarding.  Musculoskeletal: Normal range of motion.  Neurological: He is alert and oriented to person, place, and time. He has normal reflexes.  Skin: Skin is warm and dry. Capillary refill takes less than 2 seconds.  Psychiatric: His affect is labile. He exhibits a depressed mood.     ED Treatments / Results  Labs (all labs ordered are listed, but only abnormal results are displayed) Labs Reviewed  BASIC METABOLIC PANEL  CBC  ETHANOL  URINE RAPID DRUG SCREEN, HOSP PERFORMED  I-STAT [place], ED    EKG  EKG Interpretation  Date/Time:  Sunday November 12 2015 22:55:41 EDT Ventricular Rate:  117 PR Interval:    QRS Duration: 111 QT Interval:  342 QTC Calculation: 448 R Axis:   4 Text Interpretation:  Sinus tachycardia  Ventricular bigeminy Probable left atrial enlargement Confirmed by Lita Mains  MD, DAVID ([id number]) on 11/12/2015 11:00:21 PM       Radiology No results found.  Procedures Procedures (including critical care time)  Medications Ordered in ED Medications  metoprolol (LOPRESSOR) injection 2.5 mg (not administered)     Initial Impression / Assessment and Plan / ED Course  I have reviewed the triage vital signs and the nursing notes.  Pertinent labs & imaging results that were available during my care of the patient were reviewed by me and considered in my medical decision making (see chart for details).  Clinical Course   Vitals:   11/12/15 2253 11/13/15 0000  BP: (!) 149/105 (!) 126/109  Pulse: 105 100  Resp: 20 25   Results for orders placed or performed during the hospital encounter of [id number]  Basic metabolic panel  Result Value Ref Range   Sodium 135 135 - 145 mmol/L   Potassium 5.4 (H) 3.5 - 5.1 mmol/L   Chloride 101 101 - 111 mmol/L   CO2 24 22 - 32 mmol/L   Glucose, Bld 109 (H) 65 - 99 mg/dL   BUN 9 6 - 20 mg/dL   Creatinine, Ser 0.90 0.61 - 1.24 mg/dL   Calcium 9.5 8.9 - 10.3 mg/dL   GFR calc non Af Amer >60 >60 mL/min   GFR calc Af Amer >60 >60 mL/min   Anion gap 10 5 - 15  CBC  Result Value Ref Range   WBC 13.0 (H) 4.0 - 10.5 K/uL   RBC 5.93 (H) 4.22 - 5.81 MIL/uL   Hemoglobin 17.4 (H) 13.0 - 17.0 g/dL   HCT 48.5 39.0 - 52.0 %   MCV 81.8 78.0 - 100.0 fL   MCH 29.3 26.0 - 34.0 pg   MCHC 35.9 30.0 - 36.0 g/dL   RDW 13.3 11.5 - 15.5 %   Platelets 351 150 - 400 K/uL  Ethanol  Result Value Ref Range   Alcohol, Ethyl (B) 272 (H) <5 mg/dL  Urine rapid drug screen (hosp performed)  Result Value Ref Range   Opiates NONE DETECTED NONE DETECTED   Cocaine NONE DETECTED NONE DETECTED   Benzodiazepines POSITIVE (A) NONE DETECTED   Amphetamines NONE DETECTED NONE DETECTED   Tetrahydrocannabinol NONE DETECTED NONE DETECTED   Barbiturates NONE DETECTED NONE DETECTED    D-dimer, quantitative (not at [facility])  Result Value Ref Range  D-Dimer, Quant <0.27 0.00 - 0.50 ug/mL-FEU  I-stat troponin, ED  Result Value Ref Range   Troponin i, poc 0.00 0.00 - 0.08 ng/mL   Comment 3           Dg Chest 2 View  Result Date: 11/12/2015 CLINICAL DATA:  Acute onset of generalized chest pain. Initial encounter. EXAM: CHEST  2 VIEW COMPARISON:  Chest radiograph performed 11/07/2015 FINDINGS: The lungs are well-aerated. Mild bibasilar atelectasis is noted. There is no evidence of pleural effusion or pneumothorax. The heart is normal in size; the mediastinal contour is within normal limits. No acute osseous abnormalities are seen. There are chronic fractures of the superior screws of the patient's cervical spinal fusion hardware. IMPRESSION: Mild bibasilar atelectasis noted.  Lungs otherwise clear. Electronically Signed   By: Garald Balding M.D.   On: 11/12/2015 23:40   Dg Chest 2 View  Result Date: 11/07/2015 CLINICAL DATA:  Upper chest pain and dyspnea for several weeks, worse tonight. EXAM: CHEST  2 VIEW COMPARISON:  05/25/2015 FINDINGS: The lungs are clear. The pulmonary vasculature is normal. Heart size is normal. Hilar and mediastinal contours are unremarkable. There is no pleural effusion. IMPRESSION: No active cardiopulmonary disease. Electronically Signed   By: Andreas Newport M.D.   On: 11/07/2015 02:22     Final Clinical Impressions(s) / ED Diagnoses   Final diagnoses:  None   Patient decided he wants to leave AMA.  He is of sound mind and has decision making capacity to refuse care.  He is instructed that the risks of leaving AMA are but are not limited to death, arrythmia, heart attack, prolonged morbidity and pain.  He verbalizes understanding of these risks.  He is welcome to return at any time New Prescriptions New Prescriptions   No medications on file     Merrilyn Legler, MD 11/13/15 [phone]

## 2015-11-12 NOTE — ED Triage Notes (Addendum)
Pt c/o generalized chest pain. Pt told step father he needed to come to the ED and be seen. States he had about a 6 pack of beer today. States he is having thoughts of harming himself. Pt was just here last week for same complaint and had runs of non sustained V tach. Pt tearful, restful and SOB.

## 2015-11-13 LAB — POTASSIUM: POTASSIUM: 5.1 mmol/L (ref 3.5–5.1)

## 2015-11-13 LAB — RAPID URINE DRUG SCREEN, HOSP PERFORMED
AMPHETAMINES: NOT DETECTED
BARBITURATES: NOT DETECTED
Benzodiazepines: POSITIVE — AB
Cocaine: NOT DETECTED
Opiates: NOT DETECTED
TETRAHYDROCANNABINOL: NOT DETECTED

## 2015-11-13 LAB — ETHANOL: ALCOHOL ETHYL (B): 272 mg/dL — AB (ref <5)

## 2015-11-13 LAB — D-DIMER, QUANTITATIVE (NOT AT ARMC)

## 2015-11-13 NOTE — ED Notes (Signed)
Patient walking out of ED-stopped patient, patient cursing and states he wants to leave with no further treatment. Patient trying to leave with IV in hand, escorted back to room. Dr. Randal Buba in to talk with patient.  Patients states that he understands that death can occur if he leaves due to his arrythmia's. I inquired about transportation home and patient states "I'll figure it out". Patient angry and cursing, stating he wanted pain medication and we would not help him with that.  Encouraged patient to go to pain clinic and patient states, "they don't do shit". IV removed and patient ambulatory on own out of ED.

## 2015-11-15 MED FILL — traMADol HCL 50 MG TABS: 50 | 20 days supply | Qty: 60 | Fill #2

## 2015-11-15 MED FILL — ACETAMINOPHEN/COD #3 TABLET: 300-30 | 30 days supply | Qty: 240 | Fill #2

## 2015-11-15 MED FILL — METOPROLOL TARTRATE 50 MG T: 50 | 30 days supply | Qty: 60 | Fill #6

## 2015-11-22 ENCOUNTER — Ambulatory Visit (HOSPITAL_COMMUNITY): Payer: Self-pay | Admitting: Psychiatry

## 2015-11-27 ENCOUNTER — Ambulatory Visit (HOSPITAL_COMMUNITY): Payer: Self-pay | Admitting: Psychiatry

## 2015-11-28 MED FILL — GABAPENTIN 400 MG CAPSULE: 400 | 30 days supply | Qty: 180 | Fill #3

## 2015-12-05 NOTE — Progress Notes (Signed)
[facility] Progress Note  EDIN KON [id number] 50 y.o.  12/06/2015 11:57 AM  Chief Complaint:  I feel "about the same"  History of Present Illness:  Roland came for his follow-up appointment. Since the last visit, patient was admitted 8/29-9/1 for chest pain, SI and received care for alcohol withdrawal in medicine unit.   Patient states that he continues to feel anxious. He discontinue Lamictal because it made him having HI. He discontinued quetiapine as it caused him restless leg. He has racing thoughts and feels anxious constantly. He reports passive SI, although he states he would never act on it as he is religious. He states that his neck pain, back pain, and neuropathy has been worse, and he does not know how to deal with it. Although he usually drinks to calm him down, he has not drink since the recent admission. He reports AH of whisper, and sees some shadow.   Patient endorses with night time awakening with racing thought. He reports history of "manic" of being impulsive,  buying things he could not afford. It lasted for a couple of days. It occurred last in late 20's. He denies any decreased need for sleep in the past. He has a daughter with bipolar disorder. He has a suicide attempt by trying to cut his throat in 2014, when his step father intervened.   Suicidal Ideation: No Plan Formed: No Patient has means to carry out plan: No  Homicidal Ideation: No Plan Formed: No Patient has means to carry out plan: No  Past Psychiatric History/Hospitalization(s): Per chart from Dr. Adele Schilder "Patient endorse history of severe mood swing, anger issues, mania, impulsive behavior during his childhood and growing up.  He endorse hypersexual, excessive buying and excessive shopping when he was adult.  He started drinking heavily and he has history of one psychiatric inpatient treatment at behavioral [facility] in 2015 because of alcohol and pain medication abuse.  He denies any  history of suicidal attempt but admitted history of suicidal thoughts, paranoia, hallucination and depression.  He had tried Remeron, trazodone, Effexor, Prozac, Paxil with limited response.  Recently we tried Cymbalta but it makes him more anxious and irritable.  [facility] [place].  He also tried [facility] but did not like the service there." Anxiety: Yes Bipolar Disorder: Yes Depression: Yes Mania: No Psychosis: Yes Schizophrenia: No Personality Disorder: No Hospitalization for psychiatric illness: Yes History of Electroconvulsive Shock Therapy: No Prior Suicide Attempts: No  Family History; Patient reported father and grandfather has history of heavy drinking.  He also mentioned one of his uncle has schizophrenia. He has a daughter with schizophrenia  Medical History; Active Ambulatory Problems    Diagnosis Date Noted  . Essential hypertension   . Hyperlipidemia   . Tobacco abuse 01/19/2012  . Nonsustained ventricular tachycardia ([place]) 01/20/2012  . 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 Alcohol intoxication ([place]) 06/09/2012  . Chronic pain syndrome 12/28/2012  . Paroxysmal VT ([place]) 12/19/2013  . Opioid dependence ([place]) 01/22/2014  . 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 Hypokalemia   . Bipolar depression ([place]) 02/08/2014  . Neuropathy ([place]) 02/08/2014  . Pain in joint, shoulder region 05/12/2014  . Chronic radicular low back pain 06/02/2014  . Closed fracture of 5th metacarpal 07/04/2014  . Chronic fatigue 07/04/2014  . Vitamin D insufficiency 07/05/2014  . Poor dentition 07/28/2014  . Chronic maxillary sinusitis 07/28/2014  . 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 Erectile dysfunction 09/16/2014  .  Chronic anxiety 12/19/2014  . GERD (gastroesophageal reflux disease) 07/24/2015  . Suicidal ideation 11/07/2015  . Bipolar affective disorder, current episode depressed ([place])   . Non-sustained ventricular tachycardia ([place])   .  GAD (generalized anxiety disorder) 12/06/2015  . Alcohol use disorder, severe, in early remission ([place]) 12/06/2015   Resolved Ambulatory Problems    Diagnosis Date Noted  . Mold exposure 06/19/2010  . Atypical chest pain 06/19/2010  . SOB (shortness of breath) 06/19/2010  . Alcohol abuse 01/19/2012  . Abdominal pain 01/20/2012  . HTN (hypertension) 01/20/2012  . 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 Alcohol withdrawal ([place]) 01/21/2012  . Alcoholic hepatitis 49/70/2637  . H/O ETOH abuse 06/09/2012  . Cardiomyopathy, dilated, nonischemic ([place]) 06/09/2012  . Transaminitis 06/09/2012  . Hypokalemia 06/09/2012  . Acute encephalopathy 06/11/2012  . Thrush, oral 06/11/2012  . Acute respiratory failure ([place]) 06/13/2012  . Accelerated hypertension 12/28/2012  . Acute exacerbation of chronic low back pain 12/28/2012  . Diarrhea 12/28/2012  . Nicotine dependence 12/28/2012  . History of alcohol abuse 12/28/2012  . Alcohol dependence ([place]) 01/21/2013  . Seizure due to alcohol withdrawal ([place]) 01/23/2014  . Alcohol dependence with alcohol-induced mood disorder ([place])   . Substance abuse 02/03/2014  . Major depressive disorder, recurrent, severe without psychotic features ([place])   . Adjustment disorder with depressed mood 02/08/2014  . Essential hypertension 02/08/2014  . Right shoulder pain 06/02/2014  . Wheezing 07/28/2014  . Abdominal distension 09/16/2014   Past Medical History:  Diagnosis Date  . Alcohol withdrawal ([place])   . Anxiety Dx 2014  . COPD (chronic obstructive pulmonary disease) ([place])   . Drug abuse   . ETOH abuse   . GERD (gastroesophageal reflux disease) Dx 2003  . Headache(784.0)   . Hepatitis   . Hyperlipidemia Dx 2000  . Hypertension Dx 2011  . Irregular heart beat   . 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 Mental disorder   . Neuropathy ([place]) 06/07/2013  . Seizure due to alcohol withdrawal ([place]) 01/23/2014  . Shortness of breath   . Withdrawal seizures  ([place]) 2014   Review of Systems: Psychiatric: Agitation: Irritability Hallucination: Here male and a child voice but does not understand what they're talking Depressed Mood: Yes Insomnia: Yes Hypersomnia: No Altered Concentration: No Feels Worthless: Yes Grandiose Ideas: No Belief In Special Powers: No New/Increased Substance Abuse: No Compulsions: No  Neurologic: Headache: No Seizure: History of seizures due to alcohol related Paresthesias: Numbness and neuropathy in his feet   Outpatient Encounter Prescriptions as of 12/06/2015  Medication Sig  . acetaminophen-codeine (TYLENOL #3) 300-30 MG tablet Take 2 tablets by mouth every 4 (four) hours as needed for moderate pain.  . cyclobenzaprine (FLEXERIL) 10 MG tablet Take 1 tablet (10 mg total) by mouth at bedtime as needed for muscle spasms.  Marland Kitchen dicyclomine (BENTYL) 20 MG tablet Take 1 tablet (20 mg total) by mouth 3 (three) times daily as needed for spasms. (Patient not taking: Reported on 11/12/2015)  . gabapentin (NEURONTIN) 400 MG capsule Take 800 mg by mouth 3 (three) times daily.   . metoprolol (LOPRESSOR) 50 MG tablet Take 1 tablet (50 mg total) by mouth 2 (two) times daily.  . Multiple Vitamin (MULTIVITAMIN WITH MINERALS) TABS tablet Take 1 tablet by mouth daily. (Patient not taking: Reported on 11/12/2015)  . OLANZapine (ZYPREXA) 2.5 MG tablet Take 1 tablet (2.5 mg total) by mouth at bedtime.  . tadalafil (CIALIS) 20 MG tablet Take 0.5-1 tablets (10-20 mg  total) by mouth every other day as needed for erectile dysfunction.  . traMADol (ULTRAM) 50 MG tablet Take 50 mg by mouth 2 (two) times daily as needed for moderate pain.  . [DISCONTINUED] chlordiazePOXIDE (LIBRIUM) 25 MG capsule Take 1 capsule (25 mg total) by mouth 3 (three) times daily as needed for anxiety. (Patient not taking: Reported on 11/12/2015)  . [DISCONTINUED] QUEtiapine (SEROQUEL) 25 MG tablet Take 25 mg by mouth at bedtime as needed (for sleep).   No  facility-administered encounter medications on file as of 12/06/2015.    Past Medical History:  Diagnosis Date  . Alcohol withdrawal ([place])   . Anxiety Dx 2014  . COPD (chronic obstructive pulmonary disease) ([place])   . Drug abuse    pt reports opioid dependence due to previous back surgeries  . ETOH abuse   . GERD (gastroesophageal reflux disease) Dx 2003  . Headache(784.0)   . Hepatitis   . Hyperlipidemia Dx 2000  . Hypertension Dx 2011  . Irregular heart beat   . 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 Mental disorder   . Neuropathy ([place]) 06/07/2013  . Seizure due to alcohol withdrawal ([place]) 01/23/2014   Per patient report  . Shortness of breath   . Withdrawal seizures ([place]) 2014      Recent Results (from the past 2160 hour(s))  Basic metabolic panel     Status: Abnormal   Collection Time: 11/07/15  1:54 AM  Result Value Ref Range   Sodium 132 (L) 135 - 145 mmol/L   Potassium 3.3 (L) 3.5 - 5.1 mmol/L   Chloride 95 (L) 101 - 111 mmol/L   CO2 23 22 - 32 mmol/L   Glucose, Bld 111 (H) 65 - 99 mg/dL   BUN <5 (L) 6 - 20 mg/dL   Creatinine, Ser 0.70 0.61 - 1.24 mg/dL   Calcium 9.4 8.9 - 10.3 mg/dL   GFR calc non Af Amer >60 >60 mL/min   GFR calc Af Amer >60 >60 mL/min    Comment: (NOTE) The eGFR has been calculated using the CKD EPI equation. This calculation has not been validated in all clinical situations. eGFR's persistently <60 mL/min signify possible Chronic Kidney Disease.    Anion gap 14 5 - 15  CBC     Status: Abnormal   Collection Time: 11/07/15  1:54 AM  Result Value Ref Range   WBC 12.1 (H) 4.0 - 10.5 K/uL   RBC 6.26 (H) 4.22 - 5.81 MIL/uL   Hemoglobin 18.5 (H) 13.0 - 17.0 g/dL   HCT 50.1 39.0 - 52.0 %   MCV 80.0 78.0 - 100.0 fL   MCH 29.6 26.0 - 34.0 pg   MCHC 36.9 (H) 30.0 - 36.0 g/dL   RDW 12.8 11.5 - 15.5 %   Platelets 372 150 - 400 K/uL  Ethanol     Status: Abnormal   Collection Time: 11/07/15  1:54 AM  Result Value Ref Range   Alcohol, Ethyl (B) 268 (H)  <5 mg/dL    Comment:        LOWEST DETECTABLE LIMIT FOR SERUM ALCOHOL IS 5 mg/dL FOR MEDICAL PURPOSES ONLY   Salicylate level     Status: None   Collection Time: 11/07/15  1:54 AM  Result Value Ref Range   Salicylate Lvl <9.1 2.8 - 30.0 mg/dL  Acetaminophen level     Status: Abnormal   Collection Time: 11/07/15  1:54 AM  Result Value Ref Range   Acetaminophen (Tylenol), Serum <10 (L) 10 -  30 ug/mL    Comment:        THERAPEUTIC CONCENTRATIONS VARY SIGNIFICANTLY. A RANGE OF 10-30 ug/mL MAY BE AN EFFECTIVE CONCENTRATION FOR MANY PATIENTS. HOWEVER, SOME ARE BEST TREATED AT CONCENTRATIONS OUTSIDE THIS RANGE. ACETAMINOPHEN CONCENTRATIONS >150 ug/mL AT 4 HOURS AFTER INGESTION AND >50 ug/mL AT 12 HOURS AFTER INGESTION ARE OFTEN ASSOCIATED WITH TOXIC REACTIONS.   Magnesium     Status: None   Collection Time: 11/07/15  1:54 AM  Result Value Ref Range   Magnesium 2.4 1.7 - 2.4 mg/dL  I-stat troponin, ED     Status: None   Collection Time: 11/07/15  2:14 AM  Result Value Ref Range   Troponin i, poc 0.00 0.00 - 0.08 ng/mL   Comment 3            Comment: Due to the release kinetics of cTnI, a negative result within the first hours of the onset of symptoms does not rule out myocardial infarction with certainty. If myocardial infarction is still suspected, repeat the test at appropriate intervals.   Troponin I (q 6hr x 3)     Status: None   Collection Time: 11/07/15  6:58 AM  Result Value Ref Range   Troponin I <0.03 <0.03 ng/mL  Osmolality     Status: Abnormal   Collection Time: 11/07/15  6:58 AM  Result Value Ref Range   Osmolality 322 (HH) 275 - 295 mOsm/kg    Comment: REPEATED TO VERIFY CRITICAL RESULT CALLED TO, READ BACK BY AND VERIFIED WITH: NICOLE MCKOY,MLT AT [place] AT 1144 11/07/15 BY ZBEECH. CRITICAL RESULT CALLED TO, READ BACK BY AND VERIFIED WITH: RYAN,K. RN _0  ON 8.29.17 BY NMCCOY   Basic metabolic panel     Status: Abnormal   Collection Time:  11/07/15  7:00 AM  Result Value Ref Range   Sodium 136 135 - 145 mmol/L   Potassium 3.8 3.5 - 5.1 mmol/L   Chloride 101 101 - 111 mmol/L   CO2 24 22 - 32 mmol/L   Glucose, Bld 104 (H) 65 - 99 mg/dL   BUN <5 (L) 6 - 20 mg/dL   Creatinine, Ser 0.71 0.61 - 1.24 mg/dL   Calcium 9.0 8.9 - 10.3 mg/dL   GFR calc non Af Amer >60 >60 mL/min   GFR calc Af Amer >60 >60 mL/min    Comment: (NOTE) The eGFR has been calculated using the CKD EPI equation. This calculation has not been validated in all clinical situations. eGFR's persistently <60 mL/min signify possible Chronic Kidney Disease.    Anion gap 11 5 - 15  CBC     Status: None   Collection Time: 11/07/15  7:00 AM  Result Value Ref Range   WBC 6.3 4.0 - 10.5 K/uL   RBC 5.75 4.22 - 5.81 MIL/uL   Hemoglobin 16.5 13.0 - 17.0 g/dL   HCT 46.1 39.0 - 52.0 %   MCV 80.2 78.0 - 100.0 fL   MCH 28.7 26.0 - 34.0 pg   MCHC 35.8 30.0 - 36.0 g/dL   RDW 12.8 11.5 - 15.5 %   Platelets 352 150 - 400 K/uL  Magnesium     Status: Abnormal   Collection Time: 11/07/15  7:00 AM  Result Value Ref Range   Magnesium 2.5 (H) 1.7 - 2.4 mg/dL  Sodium, urine, random     Status: None   Collection Time: 11/07/15  7:54 AM  Result Value Ref Range   Sodium, Ur <10 mmol/L    Comment:  Performed at [facility]  Osmolality, urine     Status: Abnormal   Collection Time: 11/07/15  7:55 AM  Result Value Ref Range   Osmolality, Ur 178 (L) 300 - 900 mOsm/kg    Comment: Performed at [facility]  Troponin I (q 6hr x 3)     Status: None   Collection Time: 11/07/15 12:50 PM  Result Value Ref Range   Troponin I <0.03 <0.03 ng/mL  ECHOCARDIOGRAM COMPLETE     Status: None   Collection Time: 11/07/15  1:38 PM  Result Value Ref Range   Weight 3,488 oz   Height 76 in   BP 116/105 mmHg  MRSA PCR Screening     Status: None   Collection Time: 11/08/15  1:54 AM  Result Value Ref Range   MRSA by PCR NEGATIVE NEGATIVE    Comment:        The GeneXpert MRSA  Assay (FDA approved for NASAL specimens only), is one component of a comprehensive MRSA colonization surveillance program. It is not intended to diagnose MRSA infection nor to guide or monitor treatment for MRSA infections.   Basic metabolic panel     Status: None   Collection Time: 11/08/15  3:23 AM  Result Value Ref Range   Sodium 137 135 - 145 mmol/L   Potassium 3.7 3.5 - 5.1 mmol/L   Chloride 104 101 - 111 mmol/L   CO2 26 22 - 32 mmol/L   Glucose, Bld 95 65 - 99 mg/dL   BUN 7 6 - 20 mg/dL   Creatinine, Ser 0.82 0.61 - 1.24 mg/dL   Calcium 9.0 8.9 - 10.3 mg/dL   GFR calc non Af Amer >60 >60 mL/min   GFR calc Af Amer >60 >60 mL/min    Comment: (NOTE) The eGFR has been calculated using the CKD EPI equation. This calculation has not been validated in all clinical situations. eGFR's persistently <60 mL/min signify possible Chronic Kidney Disease.    Anion gap 7 5 - 15  CBC     Status: None   Collection Time: 11/08/15  3:23 AM  Result Value Ref Range   WBC 8.6 4.0 - 10.5 K/uL   RBC 5.15 4.22 - 5.81 MIL/uL   Hemoglobin 14.3 13.0 - 17.0 g/dL   HCT 41.4 39.0 - 52.0 %   MCV 80.4 78.0 - 100.0 fL   MCH 27.8 26.0 - 34.0 pg   MCHC 34.5 30.0 - 36.0 g/dL   RDW 12.6 11.5 - 15.5 %   Platelets 278 150 - 400 K/uL  Magnesium     Status: None   Collection Time: 11/08/15  3:23 AM  Result Value Ref Range   Magnesium 2.3 1.7 - 2.4 mg/dL  Basic metabolic panel     Status: Abnormal   Collection Time: 11/12/15 11:18 PM  Result Value Ref Range   Sodium 135 135 - 145 mmol/L   Potassium 5.4 (H) 3.5 - 5.1 mmol/L   Chloride 101 101 - 111 mmol/L   CO2 24 22 - 32 mmol/L   Glucose, Bld 109 (H) 65 - 99 mg/dL   BUN 9 6 - 20 mg/dL   Creatinine, Ser 0.90 0.61 - 1.24 mg/dL   Calcium 9.5 8.9 - 10.3 mg/dL   GFR calc non Af Amer >60 >60 mL/min   GFR calc Af Amer >60 >60 mL/min    Comment: (NOTE) The eGFR has been calculated using the CKD EPI equation. This calculation has not been validated in  all clinical situations. eGFR's persistently <60 mL/min signify possible Chronic Kidney Disease.    Anion gap 10 5 - 15  CBC     Status: Abnormal   Collection Time: 11/12/15 11:18 PM  Result Value Ref Range   WBC 13.0 (H) 4.0 - 10.5 K/uL   RBC 5.93 (H) 4.22 - 5.81 MIL/uL   Hemoglobin 17.4 (H) 13.0 - 17.0 g/dL   HCT 48.5 39.0 - 52.0 %   MCV 81.8 78.0 - 100.0 fL   MCH 29.3 26.0 - 34.0 pg   MCHC 35.9 30.0 - 36.0 g/dL   RDW 13.3 11.5 - 15.5 %   Platelets 351 150 - 400 K/uL  I-stat troponin, ED     Status: None   Collection Time: 11/12/15 11:22 PM  Result Value Ref Range   Troponin i, poc 0.00 0.00 - 0.08 ng/mL   Comment 3            Comment: Due to the release kinetics of cTnI, a negative result within the first hours of the onset of symptoms does not rule out myocardial infarction with certainty. If myocardial infarction is still suspected, repeat the test at appropriate intervals.   Ethanol     Status: Abnormal   Collection Time: 11/13/15 12:01 AM  Result Value Ref Range   Alcohol, Ethyl (B) 272 (H) <5 mg/dL    Comment:        LOWEST DETECTABLE LIMIT FOR SERUM ALCOHOL IS 5 mg/dL FOR MEDICAL PURPOSES ONLY   D-dimer, quantitative (not at [facility])     Status: None   Collection Time: 11/13/15 12:01 AM  Result Value Ref Range   D-Dimer, Quant <0.27 0.00 - 0.50 ug/mL-FEU    Comment: (NOTE) At the manufacturer cut-off of 0.50 ug/mL FEU, this assay has been documented to exclude PE with a sensitivity and negative predictive value of 97 to 99%.  At this time, this assay has not been approved by the FDA to exclude DVT/VTE. Results should be correlated with clinical presentation.   Urine rapid drug screen (hosp performed)     Status: Abnormal   Collection Time: 11/13/15 12:19 AM  Result Value Ref Range   Opiates NONE DETECTED NONE DETECTED   Cocaine NONE DETECTED NONE DETECTED   Benzodiazepines POSITIVE (A) NONE DETECTED   Amphetamines NONE DETECTED NONE DETECTED    Tetrahydrocannabinol NONE DETECTED NONE DETECTED   Barbiturates NONE DETECTED NONE DETECTED    Comment:        DRUG SCREEN FOR MEDICAL PURPOSES ONLY.  IF CONFIRMATION IS NEEDED FOR ANY PURPOSE, NOTIFY LAB WITHIN 5 DAYS.        LOWEST DETECTABLE LIMITS FOR URINE DRUG SCREEN Drug Class       Cutoff (ng/mL) Amphetamine      1000 Barbiturate      200 Benzodiazepine   161 Tricyclics       096 Opiates          300 Cocaine          300 THC              50   Potassium     Status: None   Collection Time: 11/13/15 12:32 AM  Result Value Ref Range   Potassium 5.1 3.5 - 5.1 mmol/L      Constitutional:  BP 136/76   Ht _0  (1.93 m)   Wt 216 lb (98 kg)   BMI 26.29 kg/m    Musculoskeletal: Strength & Muscle Tone: decreased Gait &  Station: normal Patient leans: N/A  Psychiatric Specialty Exam: General Appearance: Fairly Groomed and Guarded  Engineer, water::  Good  Speech:  Clear and Coherent  Volume:  Normal  Mood:  Anxious and Depressed  Affect:  Constricted and Depressed  Thought Process:  Coherent and Goal Directed  Orientation:  Full (Time, Place, and Person)  Thought Content:  Logical AH of people talking (whisper), VH of seeing a shadow   Suicidal Thoughts:  No  Homicidal Thoughts:  No  Memory:  Immediate;   Fair Recent;   Fair Remote;   Fair  Judgement:  Fair  Insight:  Fair  Psychomotor Activity:  Decreased  Concentration:  Fair  Recall:  [employer] of Knowledge:  Fair  Language:  Good  Akathisia:  No  Handed:  Right  AIMS (if indicated):     Assets:  Communication Skills Desire for Improvement  ADL's:  Intact  Cognition:  WNL  Sleep:   insomnia     Established Problem, Stable/Improving (1), Review of Psycho-Social Stressors (1), Review and summation of old records (2), Established Problem, Worsening (2), Review of Last Therapy Session (1), Review of Medication Regimen & Side Effects (2) and Review of New Medication or Change in Dosage  (2)  Assessment: MUZAMMIL BRUINS is a 50 year old male with bipolar disorder, alcohol use disorder, anxiety,  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 He is one of Dr. Marguerite Olea patients. Patient was admitted 8/29-9/1 for chest pain, suicidality and received care for alcohol withdrawal in medicine unit.   # GAD # Bipolar disorder Patient endorses significant anxiety in the setting of his chronic pain and neuropathy. He self discontinued Lamictal and quetiapine due to adverse effect. Will start olanzapine to target his mood symptoms and insomnia given his limited response to SSRI/SNRI in the past and based on his history of bipolar disorder. Discussed metabolic side effects and akathisia. Noted that he declines the option of up titrating gabapentin given it has little benefit when he tried. He will greatly benefit from CBT to target his anxiety/pain; will make a referral.   # Alcohol use disorder Patient relapsed on alcohol a few weeks ago in the setting of worsening chronic pain. Patient is motivated for sobriety. Will continue motivational interview. He may benefit from pharmacologic treatment for abstinence; will explore on the next encounter.    Plans 1. Start olanzapine 2.5 mg at night 2. Discontinue quetiapine 3. Referral for psychotherapy/CBT to target anxiety and pain 4. Return to clinic in a month  The patient demonstrates the following  risk factors for suicide: Chronic risk factors for suicide include psychiatric disorder; bipolar disorder, anxiety, substance use disorder, medical illness of chronic pain, previous SA, demographic factors (male). Acute risk factors for suicide include medical problems.  Protective factors for this patient include positive social support, coping skills, hope for the future, religious beliefs against suicide.  Considering these factors, the overall suicide risk at this point appears to be moderately  elevated but not at imminent danger to self. Discussed emergency resources which includes 911, crisis line and coming to ED.    Norman Clay, MD 12/06/2015

## 2015-12-06 ENCOUNTER — Ambulatory Visit (INDEPENDENT_AMBULATORY_CARE_PROVIDER_SITE_OTHER): Payer: No Typology Code available for payment source | Admitting: Psychiatry

## 2015-12-06 VITALS — BP 136/76 | Ht 76.0 in | Wt 216.0 lb

## 2015-12-06 DIAGNOSIS — F411 Generalized anxiety disorder: Secondary | ICD-10-CM | POA: Insufficient documentation

## 2015-12-06 DIAGNOSIS — F1021 Alcohol dependence, in remission: Secondary | ICD-10-CM | POA: Insufficient documentation

## 2015-12-06 MED ORDER — OLANZAPINE 2.5 MG PO TABS: 2.5000 mg | ORAL_TABLET | Freq: Every day | ORAL | 2 refills | Status: DC

## 2015-12-06 MED FILL — OLANZapine 2.5 MG TABS: 2.5 | 30 days supply | Qty: 30 | Fill #0

## 2015-12-06 NOTE — Patient Instructions (Addendum)
1. Start olanzapine 2.5 mg at night 2. Discontinue quetiapine 3. Make an appointment with a therapist 4. Return to clinic in a month

## 2015-12-13 ENCOUNTER — Other Ambulatory Visit: Payer: Self-pay | Admitting: Family Medicine

## 2015-12-13 MED FILL — METOPROLOL TARTRATE 50 MG T: 50 | 30 days supply | Qty: 60 | Fill #7

## 2015-12-13 NOTE — Telephone Encounter (Signed)
Pt called requesting medication refill on traMADol (ULTRAM) 50 MG tablet, acetaminophen-codeine (TYLENOL #3) 300-30 MG tablet please f/up

## 2015-12-14 MED ORDER — ACETAMINOPHEN-CODEINE #3 300-30 MG PO TABS: 1.0000 | ORAL_TABLET | ORAL | 0 refills | Status: DC | PRN

## 2015-12-14 MED ORDER — TRAMADOL HCL 50 MG PO TABS: 50.0000 mg | ORAL_TABLET | Freq: Two times a day (BID) | ORAL | 0 refills | Status: DC | PRN

## 2015-12-14 NOTE — Telephone Encounter (Signed)
Received tylenol #3 and tramadol refill request from patient who has chornic pain.   I note that patient was recently admitted for alcohol abuse. I am concerned that he still abusing alcohol and would therefore not be a safe to take chronic daily tylenol #3 as he was taking 240 tylenol #3 monthly along with 60 tramadol for his chronic pain.  Called patient, left VM requesting he call for hospital f/u appt   Plan: F/u OV needed, patient informed Tramadol refill pended, so # 60  Tylenol #3 refill pended but changed to 1 tab q 4 instead of 2, so # 120   Rx pended for Dr. Doreene Burke to sign

## 2015-12-15 ENCOUNTER — Encounter: Payer: Self-pay | Admitting: Family Medicine

## 2015-12-15 ENCOUNTER — Ambulatory Visit: Payer: No Typology Code available for payment source | Attending: Family Medicine | Admitting: Family Medicine

## 2015-12-15 VITALS — BP 169/93 | HR 107 | Temp 98.6°F | Ht 76.0 in | Wt 217.6 lb

## 2015-12-15 DIAGNOSIS — M79671 Pain in right foot: Secondary | ICD-10-CM | POA: Insufficient documentation

## 2015-12-15 DIAGNOSIS — F1021 Alcohol dependence, in remission: Secondary | ICD-10-CM

## 2015-12-15 DIAGNOSIS — G894 Chronic pain syndrome: Secondary | ICD-10-CM | POA: Insufficient documentation

## 2015-12-15 DIAGNOSIS — I10 Essential (primary) hypertension: Secondary | ICD-10-CM

## 2015-12-15 DIAGNOSIS — F17210 Nicotine dependence, cigarettes, uncomplicated: Secondary | ICD-10-CM | POA: Insufficient documentation

## 2015-12-15 DIAGNOSIS — Z0000 Encounter for general adult medical examination without abnormal findings: Secondary | ICD-10-CM

## 2015-12-15 DIAGNOSIS — Z760 Encounter for issue of repeat prescription: Secondary | ICD-10-CM | POA: Insufficient documentation

## 2015-12-15 DIAGNOSIS — M79674 Pain in right toe(s): Secondary | ICD-10-CM | POA: Insufficient documentation

## 2015-12-15 DIAGNOSIS — R200 Anesthesia of skin: Secondary | ICD-10-CM | POA: Insufficient documentation

## 2015-12-15 DIAGNOSIS — F10229 Alcohol dependence with intoxication, unspecified: Secondary | ICD-10-CM | POA: Insufficient documentation

## 2015-12-15 DIAGNOSIS — M7989 Other specified soft tissue disorders: Secondary | ICD-10-CM | POA: Insufficient documentation

## 2015-12-15 DIAGNOSIS — Z79899 Other long term (current) drug therapy: Secondary | ICD-10-CM | POA: Insufficient documentation

## 2015-12-15 LAB — COMPLETE METABOLIC PANEL WITH GFR
ALT: 15 U/L (ref 9–46)
AST: 15 U/L (ref 10–35)
Albumin: 4.6 g/dL (ref 3.6–5.1)
Alkaline Phosphatase: 84 U/L (ref 40–115)
BUN: 11 mg/dL (ref 7–25)
CHLORIDE: 99 mmol/L (ref 98–110)
CO2: 28 mmol/L (ref 20–31)
CREATININE: 0.84 mg/dL (ref 0.70–1.33)
Calcium: 9.9 mg/dL (ref 8.6–10.3)
GFR, Est Non African American: >89 mL/min (ref >=60)
GLUCOSE: 92 mg/dL (ref 65–99)
Potassium: 4.4 mmol/L (ref 3.5–5.3)
SODIUM: 138 mmol/L (ref 135–146)
Total Bilirubin: 0.6 mg/dL (ref 0.2–1.2)
Total Protein: 7.2 g/dL (ref 6.1–8.1)

## 2015-12-15 MED ORDER — ACETAMINOPHEN-CODEINE #3 300-30 MG PO TABS: 1.0000 | ORAL_TABLET | ORAL | 0 refills | Status: DC | PRN

## 2015-12-15 MED FILL — traMADol HCL 50 MG TABS: 50 | 30 days supply | Qty: 60 | Fill #0

## 2015-12-15 NOTE — Progress Notes (Signed)
Subjective:  Patient ID: Mark [name], male    DOB: [birth year]  Age: 50 y.o. MRN: [id number]  CC: Medication Refill   HPI [name] presents for   1. 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 Has had pain since mid 90s. First back surgery was in 1994/1995. Pain became debilitating in 2000. He was previously on oxycodone and methadone. Pain is low back, radiates down posterior thighs. He has pain in feet also. Pain with standing. Numbness in feet. Deep aches exacerbated by standing, walking or sitting in one position. He continues to smoke. Gabapentin helps. He has tried Lyrica which did not help more than the tramadol.  Tylenol #3 helps. Overall numbness and pains in feet are worsening. He has a new patient appointment with pain management next month.   He was in the ED earlier in the month with chest pain and alcohol intoxication. He reports he drinks beer rarely when his pain is bad. He denies daily use. No beer today. No significant abdominal pain, nausea, emesis, swelling or yellowing of skin or eyes.  2. R great toe pain: stubbed toe 3 weeks ago. Has bruising and swelling. He does not know how bad the stub was because he has neuropathy. Bruising has resolved. Swelling persist.   Social History  Substance Use Topics  . Smoking status: Current Every Day Smoker    Packs/day: 0.50    Years: 30.00    Types: Cigarettes  . Smokeless tobacco: Never Used     Comment: 11-30-2014 info given  . Alcohol use 0.0 oz/week     Comment: occ    Outpatient Medications Prior to Visit  Medication Sig Dispense Refill  . acetaminophen-codeine (TYLENOL #3) 300-30 MG tablet Take 1 tablet by mouth every 4 (four) hours as needed for moderate pain. 120 tablet 0  . cyclobenzaprine (FLEXERIL) 10 MG tablet Take 1 tablet (10 mg total) by mouth at bedtime as needed for muscle spasms. 60 tablet 5  . gabapentin (NEURONTIN) 400 MG capsule Take 800 mg by mouth 3 (three) times  daily.   5  . metoprolol (LOPRESSOR) 50 MG tablet Take 1 tablet (50 mg total) by mouth 2 (two) times daily. 60 tablet 11  . tadalafil (CIALIS) 20 MG tablet Take 0.5-1 tablets (10-20 mg total) by mouth every other day as needed for erectile dysfunction. 30 tablet 3  . traMADol (ULTRAM) 50 MG tablet Take 1 tablet (50 mg total) by mouth 2 (two) times daily as needed for moderate pain. 60 tablet 0  . dicyclomine (BENTYL) 20 MG tablet Take 1 tablet (20 mg total) by mouth 3 (three) times daily as needed for spasms. (Patient not taking: Reported on 12/15/2015) 90 tablet 1  . Multiple Vitamin (MULTIVITAMIN WITH MINERALS) TABS tablet Take 1 tablet by mouth daily. (Patient not taking: Reported on 12/15/2015)    . OLANZapine (ZYPREXA) 2.5 MG tablet Take 1 tablet (2.5 mg total) by mouth at bedtime. (Patient not taking: Reported on 12/15/2015) 30 tablet 2   No facility-administered medications prior to visit.     ROS Review of Systems  Constitutional: Negative for chills, fatigue, fever and unexpected weight change.  Eyes: Negative for visual disturbance.  Respiratory: Negative for cough and shortness of breath.   Cardiovascular: Negative for chest pain, palpitations and leg swelling.  Gastrointestinal: Negative for abdominal pain, blood in stool, constipation, diarrhea, nausea and vomiting.  Endocrine: Negative for polydipsia, polyphagia and polyuria.  Musculoskeletal: Positive for joint swelling (  R great toe ). Negative for arthralgias, back pain, gait problem, myalgias and neck pain.  Skin: Negative for rash.  Allergic/Immunologic: Negative for immunocompromised state.  Neurological: Positive for numbness (in R hand and feet ).  Hematological: Negative for adenopathy. Does not bruise/bleed easily.  Psychiatric/Behavioral: Positive for decreased concentration. Negative for dysphoric mood, sleep disturbance and suicidal ideas. The patient is not nervous/anxious.     Objective:  BP (!) 169/93 (BP  Location: Right Arm, Patient Position: Sitting, Cuff Size: Small)   Pulse (!) 107   Temp 98.6 F (37 C) (Oral)   Ht 6\' 4"  (1.93 m)   Wt 217 lb 9.6 oz (98.7 kg)   SpO2 97%   BMI 26.49 kg/m   BP/Weight 12/15/2015 [id number]  Systolic BP 123XX123 123XX123 -  Diastolic BP 93 0000000 -  Wt. (Lbs) 217.6 - 220  BMI 26.49 - 26.78  Some encounter information is confidential and restricted. Go to Review Flowsheets activity to see all data.   BP Readings from Last 3 Encounters:  12/15/15 (!) 169/93  11/13/15 (!) 126/109  11/10/15 (!) 146/93    Physical Exam  Constitutional: He appears well-developed and well-nourished. No distress.  HENT:  Head: Normocephalic and atraumatic.  Neck: Normal range of motion. Neck supple.  Cardiovascular: Normal rate, regular rhythm, normal heart sounds and intact distal pulses.   Pulmonary/Chest: Effort normal and breath sounds normal.  Musculoskeletal: He exhibits tenderness. He exhibits no edema.       Lumbar back: He exhibits decreased range of motion, tenderness, pain and spasm. He exhibits no bony tenderness, no swelling, no edema, no deformity, no laceration and normal pulse.       Feet:  Tenderness both plantar feet without lesion or deformity 2+ DP pulses   Neurological: He is alert. Gait abnormal.  Skin: Skin is warm and dry. No rash noted. No erythema.  Psychiatric: He has a normal mood and affect.     Assessment & Plan:  Josearmando was seen today for medication refill.  Diagnoses and all orders for this visit:  Pain and swelling of toe of right foot -     DG Toe Great Right; Future  Alcohol use disorder, severe, in early remission ([name]) -     COMPLETE METABOLIC PANEL WITH GFR  Chronic pain syndrome -     acetaminophen-codeine (TYLENOL #3) 300-30 MG tablet; Take 1-2 tablets by mouth every 4 (four) hours as needed for moderate pain.  Healthcare maintenance -     Ambulatory referral to Gastroenterology   There are no diagnoses linked to this  encounter.  No orders of the defined types were placed in this encounter.   Follow-up: Return in about 6 weeks (around 01/26/2016) for chronic pain .   Boykin Nearing MD

## 2015-12-15 NOTE — Assessment & Plan Note (Signed)
Elevated BP noted on exam Patient reported compliance with metoprolol Continue metoprolol Close f/u for recheck

## 2015-12-15 NOTE — Patient Instructions (Addendum)
Antwion was seen today for medication refill.  Diagnoses and all orders for this visit:  Pain and swelling of toe of right foot -     DG Toe Great Right; Future  Alcohol use disorder, severe, in early remission ([place]) -     COMPLETE METABOLIC PANEL WITH GFR  Chronic pain syndrome  Healthcare maintenance -     Ambulatory referral to Gastroenterology   An Rx for 120 tylenol #4 will be [facility], just something to consider Continue   Checking liver function test today    F/u in 6 weeks for chronic pain  Dr. Adrian Blackwater

## 2015-12-15 NOTE — Progress Notes (Signed)
Pt hit toe 2 weeks ago, toe is a little discolored.  Pt declined flu shot.

## 2015-12-28 MED FILL — GABAPENTIN 400 MG CAPSULE: 400 | 30 days supply | Qty: 180 | Fill #4

## 2015-12-29 ENCOUNTER — Encounter: Payer: Self-pay | Admitting: Gastroenterology

## 2016-01-08 ENCOUNTER — Ambulatory Visit (HOSPITAL_COMMUNITY): Payer: Self-pay | Admitting: Psychiatry

## 2016-01-09 MED FILL — ?METOPROLOL 50 MG TABLET: 50 | 30 days supply | Qty: 60 | Fill #8

## 2016-01-10 ENCOUNTER — Ambulatory Visit (INDEPENDENT_AMBULATORY_CARE_PROVIDER_SITE_OTHER): Payer: No Typology Code available for payment source | Admitting: Psychiatry

## 2016-01-10 ENCOUNTER — Telehealth: Payer: Self-pay | Admitting: Family Medicine

## 2016-01-10 ENCOUNTER — Other Ambulatory Visit: Payer: Self-pay | Admitting: Family Medicine

## 2016-01-10 ENCOUNTER — Encounter (HOSPITAL_COMMUNITY): Payer: Self-pay | Admitting: Psychiatry

## 2016-01-10 VITALS — BP 112/78 | HR 75 | Ht 76.0 in | Wt 213.6 lb

## 2016-01-10 DIAGNOSIS — F3132 Bipolar disorder, current episode depressed, moderate: Secondary | ICD-10-CM

## 2016-01-10 DIAGNOSIS — G894 Chronic pain syndrome: Secondary | ICD-10-CM

## 2016-01-10 DIAGNOSIS — Z818 Family history of other mental and behavioral disorders: Secondary | ICD-10-CM

## 2016-01-10 DIAGNOSIS — F319 Bipolar disorder, unspecified: Secondary | ICD-10-CM

## 2016-01-10 DIAGNOSIS — Z79899 Other long term (current) drug therapy: Secondary | ICD-10-CM

## 2016-01-10 MED ORDER — OLANZAPINE 2.5 MG PO TABS: 2.5000 mg | ORAL_TABLET | Freq: Every day | ORAL | 0 refills | Status: DC

## 2016-01-10 MED ORDER — BUSPIRONE HCL 7.5 MG PO TABS: 7.5000 mg | ORAL_TABLET | Freq: Three times a day (TID) | ORAL | 0 refills | Status: DC

## 2016-01-10 MED ORDER — NORTRIPTYLINE HCL 25 MG PO CAPS: ORAL_CAPSULE | ORAL | 0 refills | Status: DC

## 2016-01-10 MED FILL — NORTRIPTYLINE HCL 25 MG CAP: 25 | 33 days supply | Qty: 60 | Fill #0

## 2016-01-10 MED FILL — busPIRone HCL 7.5 MG TABS: 7.5 | 30 days supply | Qty: 90 | Fill #0

## 2016-01-10 MED FILL — OLANZapine 2.5 MG TABS: 2.5 | 30 days supply | Qty: 30 | Fill #0

## 2016-01-10 MED FILL — ?CYCLOBENZAPRINE 10 MG TABL: 10 | 30 days supply | Qty: 30 | Fill #1

## 2016-01-10 NOTE — Telephone Encounter (Signed)
Pt. Called requesting a refill for Tramadol and Tylenol # 3.  Please f/u.

## 2016-01-10 NOTE — Progress Notes (Signed)
[facility] Progress Note  Mark [name] [id number] 50 y.o.  01/10/2016 12:28 PM  Chief Complaint:  "I'm not well"  History of Present Illness:  Mark [name] came for his follow-up appointment. Patient states he has not felt well since the last appointment. He feels more anxious and endorses insomnia. He complains of worsening chronic pain on his shoulder, neck and his neuropathy. He feels very frustrated that he can't do anything due to his physical state. He has tried physical therapy with very limited effect. He states that he mostly stays at house, watching TV. He has good relationship with his daughters and occasionally lives with them. He states he feels more emotional, feeling something is "out of skin" and has crying spells. He has racing thought and panic attack every day without any significant triggers. He feels overwhelmed about his current situation and would like to try dictation which he has not tried before. He is hoping to be back on Xanax or Ativan, which worked in the past. He reports passive SI, although he denies any intent or plans. He reports worsening concentration and memory. He denies recent alcohol use, last two months ago. He denies drug use.   He reports history of "manic" of being impulsive,  buying things he could not afford. It lasted for a couple of days. It occurred last in late 20's. He denies any decreased need for sleep in the past. He denies any psychiatry admission for mania. He was diagnosed bipolar disorder in 2013, when he answered questionnaire. He has a daughter with bipolar disorder. He has a suicide attempt by trying to cut his throat in 2014, when his step father intervened.   Naloxone 50 mg daily  Suicidal Ideation: No Plan Formed: No Patient has means to carry out plan: No  Homicidal Ideation: No Plan Formed: No Patient has means to carry out plan: No  Past Psychiatric History/Hospitalization(s): Per chart from Dr. Adele Schilder "Patient endorse  history of severe mood swing, anger issues, mania, impulsive behavior during his childhood and growing up.  He endorse hypersexual, excessive buying and excessive shopping when he was adult.  He started drinking heavily and he has history of one psychiatric inpatient treatment at behavioral [facility] in 2015 because of alcohol and pain medication abuse.  He denies any history of suicidal attempt but admitted history of suicidal thoughts, paranoia, hallucination and depression.  He had tried Remeron, trazodone, Effexor, Prozac, Paxil with limited response.  Recently we tried Cymbalta but it makes him more anxious and irritable.  [facility].  He also tried [facility] but did not like the service there."  Anxiety: Yes Bipolar Disorder: Yes Depression: Yes Mania: No Psychosis: Yes Schizophrenia: No Personality Disorder: No Hospitalization for psychiatric illness: Yes History of Electroconvulsive Shock Therapy: No Prior Suicide Attempts: No  Family History; Patient reported father and grandfather has history of heavy drinking.  He also mentioned one of his uncle has schizophrenia. He has a daughter with schizophrenia  Medical History; Active Ambulatory Problems    Diagnosis Date Noted  . Essential hypertension   . Hyperlipidemia   . Tobacco abuse 01/19/2012  . Nonsustained ventricular tachycardia ([place]) 01/20/2012  . 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 Alcohol intoxication ([place]) 06/09/2012  . Chronic pain syndrome 12/28/2012  . Paroxysmal VT ([place]) 12/19/2013  . Opioid dependence ([place]) 01/22/2014  . 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 Hypokalemia   . Bipolar depression ([place]) 02/08/2014  .  Neuropathy ([place]) 02/08/2014  . Pain in joint, shoulder region 05/12/2014  . Chronic radicular low back pain 06/02/2014  . Closed fracture of 5th metacarpal 07/04/2014  . Chronic fatigue 07/04/2014  . Vitamin D insufficiency 07/05/2014   . Poor dentition 07/28/2014  . Chronic maxillary sinusitis 07/28/2014  . 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 Erectile dysfunction 09/16/2014  . Chronic anxiety 12/19/2014  . GERD (gastroesophageal reflux disease) 07/24/2015  . Suicidal ideation 11/07/2015  . Bipolar affective disorder, current episode depressed ([place])   . Non-sustained ventricular tachycardia ([place])   . GAD (generalized anxiety disorder) 12/06/2015  . Alcohol use disorder, severe, in early remission ([place]) 12/06/2015   Resolved Ambulatory Problems    Diagnosis Date Noted  . Mold exposure 06/19/2010  . Atypical chest pain 06/19/2010  . SOB (shortness of breath) 06/19/2010  . Alcohol abuse 01/19/2012  . Abdominal pain 01/20/2012  . HTN (hypertension) 01/20/2012  . 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 Alcohol withdrawal ([place]) 01/21/2012  . Alcoholic hepatitis 62/69/4854  . H/O ETOH abuse 06/09/2012  . Cardiomyopathy, dilated, nonischemic ([place]) 06/09/2012  . Transaminitis 06/09/2012  . Hypokalemia 06/09/2012  . Acute encephalopathy 06/11/2012  . Thrush, oral 06/11/2012  . Acute respiratory failure ([place]) 06/13/2012  . Accelerated hypertension 12/28/2012  . Acute exacerbation of chronic low back pain 12/28/2012  . Diarrhea 12/28/2012  . Nicotine dependence 12/28/2012  . History of alcohol abuse 12/28/2012  . Alcohol dependence ([place]) 01/21/2013  . Seizure due to alcohol withdrawal ([place]) 01/23/2014  . Alcohol dependence with alcohol-induced mood disorder ([place])   . Substance abuse 02/03/2014  . Major depressive disorder, recurrent, severe without psychotic features ([place])   . Adjustment disorder with depressed mood 02/08/2014  . Essential hypertension 02/08/2014  . Right shoulder pain 06/02/2014  . Wheezing 07/28/2014  . Abdominal distension 09/16/2014   Past Medical History:  Diagnosis Date  . Alcohol withdrawal ([place])   . Anxiety Dx 2014  . COPD (chronic obstructive pulmonary disease) ([place])   . Drug  abuse   . ETOH abuse   . GERD (gastroesophageal reflux disease) Dx 2003  . Headache(784.0)   . Hepatitis   . Hyperlipidemia Dx 2000  . Hypertension Dx 2011  . Irregular heart beat   . 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 Mental disorder   . Neuropathy ([place]) 06/07/2013  . Seizure due to alcohol withdrawal ([place]) 01/23/2014  . Shortness of breath   . Withdrawal seizures ([place]) 2014   Review of Systems: Psychiatric: Agitation: Irritability Hallucination: Here male and a child voice but does not understand what they're talking Depressed Mood: Yes Insomnia: Yes Hypersomnia: No Altered Concentration: No Feels Worthless: Yes Grandiose Ideas: No Belief In Special Powers: No New/Increased Substance Abuse: No Compulsions: No  Neurologic: Headache: No Seizure: History of seizures due to alcohol related Paresthesias: Numbness and neuropathy in his feet ROS + neck pain, back pain, tingling on his foot. Occasional palpitation. Otherwise 14 point system review are negative.   Outpatient Encounter Prescriptions as of 01/10/2016  Medication Sig  . acetaminophen-codeine (TYLENOL #3) 300-30 MG tablet Take 1-2 tablets by mouth every 4 (four) hours as needed for moderate pain.  . cyclobenzaprine (FLEXERIL) 10 MG tablet Take 1 tablet (10 mg total) by mouth at bedtime as needed for muscle spasms.  Marland Kitchen dicyclomine (BENTYL) 20 MG tablet Take 1 tablet (20 mg total) by mouth 3 (three) times daily as needed for spasms. (Patient not taking: Reported on 12/15/2015)  . gabapentin (NEURONTIN) 400 MG  capsule Take 800 mg by mouth 3 (three) times daily.   . metoprolol (LOPRESSOR) 50 MG tablet Take 1 tablet (50 mg total) by mouth 2 (two) times daily.  . Multiple Vitamin (MULTIVITAMIN WITH MINERALS) TABS tablet Take 1 tablet by mouth daily. (Patient not taking: Reported on 12/15/2015)  . OLANZapine (ZYPREXA) 2.5 MG tablet Take 1 tablet (2.5 mg total) by mouth at bedtime. (Patient not taking: Reported on 12/15/2015)  .  tadalafil (CIALIS) 20 MG tablet Take 0.5-1 tablets (10-20 mg total) by mouth every other day as needed for erectile dysfunction.  . traMADol (ULTRAM) 50 MG tablet Take 1 tablet (50 mg total) by mouth 2 (two) times daily as needed for moderate pain.   No facility-administered encounter medications on file as of 01/10/2016.    Past Medical History:  Diagnosis Date  . Alcohol withdrawal ([place])   . Anxiety Dx 2014  . COPD (chronic obstructive pulmonary disease) ([place])   . Drug abuse    pt reports opioid dependence due to previous back surgeries  . ETOH abuse   . GERD (gastroesophageal reflux disease) Dx 2003  . Headache(784.0)   . Hepatitis   . Hyperlipidemia Dx 2000  . Hypertension Dx 2011  . Irregular heart beat   . 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 Mental disorder   . Neuropathy ([place]) 06/07/2013  . Seizure due to alcohol withdrawal ([place]) 01/23/2014   Per patient report  . Shortness of breath   . Withdrawal seizures ([place]) 2014      Recent Results (from the past 2160 hour(s))  Basic metabolic panel     Status: Abnormal   Collection Time: 11/07/15  1:54 AM  Result Value Ref Range   Sodium 132 (L) 135 - 145 mmol/L   Potassium 3.3 (L) 3.5 - 5.1 mmol/L   Chloride 95 (L) 101 - 111 mmol/L   CO2 23 22 - 32 mmol/L   Glucose, Bld 111 (H) 65 - 99 mg/dL   BUN <5 (L) 6 - 20 mg/dL   Creatinine, Ser 0.70 0.61 - 1.24 mg/dL   Calcium 9.4 8.9 - 10.3 mg/dL   GFR calc non Af Amer >60 >60 mL/min   GFR calc Af Amer >60 >60 mL/min    Comment: (NOTE) The eGFR has been calculated using the CKD EPI equation. This calculation has not been validated in all clinical situations. eGFR's persistently <60 mL/min signify possible Chronic Kidney Disease.    Anion gap 14 5 - 15  CBC     Status: Abnormal   Collection Time: 11/07/15  1:54 AM  Result Value Ref Range   WBC 12.1 (H) 4.0 - 10.5 K/uL   RBC 6.26 (H) 4.22 - 5.81 MIL/uL   Hemoglobin 18.5 (H) 13.0 - 17.0 g/dL   HCT 50.1 39.0 - 52.0 %   MCV 80.0  78.0 - 100.0 fL   MCH 29.6 26.0 - 34.0 pg   MCHC 36.9 (H) 30.0 - 36.0 g/dL   RDW 12.8 11.5 - 15.5 %   Platelets 372 150 - 400 K/uL  Ethanol     Status: Abnormal   Collection Time: 11/07/15  1:54 AM  Result Value Ref Range   Alcohol, Ethyl (B) 268 (H) <5 mg/dL    Comment:        LOWEST DETECTABLE LIMIT FOR SERUM ALCOHOL IS 5 mg/dL FOR MEDICAL PURPOSES ONLY   Salicylate level     Status: None   Collection Time: 11/07/15  1:54 AM  Result Value Ref Range  Salicylate Lvl <0.3 2.8 - 30.0 mg/dL  Acetaminophen level     Status: Abnormal   Collection Time: 11/07/15  1:54 AM  Result Value Ref Range   Acetaminophen (Tylenol), Serum <10 (L) 10 - 30 ug/mL    Comment:        THERAPEUTIC CONCENTRATIONS VARY SIGNIFICANTLY. A RANGE OF 10-30 ug/mL MAY BE AN EFFECTIVE CONCENTRATION FOR MANY PATIENTS. HOWEVER, SOME ARE BEST TREATED AT CONCENTRATIONS OUTSIDE THIS RANGE. ACETAMINOPHEN CONCENTRATIONS >150 ug/mL AT 4 HOURS AFTER INGESTION AND >50 ug/mL AT 12 HOURS AFTER INGESTION ARE OFTEN ASSOCIATED WITH TOXIC REACTIONS.   Magnesium     Status: None   Collection Time: 11/07/15  1:54 AM  Result Value Ref Range   Magnesium 2.4 1.7 - 2.4 mg/dL  I-stat troponin, ED     Status: None   Collection Time: 11/07/15  2:14 AM  Result Value Ref Range   Troponin i, poc 0.00 0.00 - 0.08 ng/mL   Comment 3            Comment: Due to the release kinetics of cTnI, a negative result within the first hours of the onset of symptoms does not rule out myocardial infarction with certainty. If myocardial infarction is still suspected, repeat the test at appropriate intervals.   Troponin I (q 6hr x 3)     Status: None   Collection Time: 11/07/15  6:58 AM  Result Value Ref Range   Troponin I <0.03 <0.03 ng/mL  Osmolality     Status: Abnormal   Collection Time: 11/07/15  6:58 AM  Result Value Ref Range   Osmolality 322 (HH) 275 - 295 mOsm/kg    Comment: REPEATED TO VERIFY CRITICAL RESULT CALLED TO, READ  BACK BY AND VERIFIED WITH: NICOLE MCKOY,MLT AT [facility] AT 1144 11/07/15 BY ZBEECH. CRITICAL RESULT CALLED TO, READ BACK BY AND VERIFIED WITH: RYAN,K. RN '@[id number]'  ON 8.29.17 BY NMCCOY   Basic metabolic panel     Status: Abnormal   Collection Time: 11/07/15  7:00 AM  Result Value Ref Range   Sodium 136 135 - 145 mmol/L   Potassium 3.8 3.5 - 5.1 mmol/L   Chloride 101 101 - 111 mmol/L   CO2 24 22 - 32 mmol/L   Glucose, Bld 104 (H) 65 - 99 mg/dL   BUN <5 (L) 6 - 20 mg/dL   Creatinine, Ser 0.71 0.61 - 1.24 mg/dL   Calcium 9.0 8.9 - 10.3 mg/dL   GFR calc non Af Amer >60 >60 mL/min   GFR calc Af Amer >60 >60 mL/min    Comment: (NOTE) The eGFR has been calculated using the CKD EPI equation. This calculation has not been validated in all clinical situations. eGFR's persistently <60 mL/min signify possible Chronic Kidney Disease.    Anion gap 11 5 - 15  CBC     Status: None   Collection Time: 11/07/15  7:00 AM  Result Value Ref Range   WBC 6.3 4.0 - 10.5 K/uL   RBC 5.75 4.22 - 5.81 MIL/uL   Hemoglobin 16.5 13.0 - 17.0 g/dL   HCT 46.1 39.0 - 52.0 %   MCV 80.2 78.0 - 100.0 fL   MCH 28.7 26.0 - 34.0 pg   MCHC 35.8 30.0 - 36.0 g/dL   RDW 12.8 11.5 - 15.5 %   Platelets 352 150 - 400 K/uL  Magnesium     Status: Abnormal   Collection Time: 11/07/15  7:00 AM  Result Value Ref Range   Magnesium 2.5 (  H) 1.7 - 2.4 mg/dL  Sodium, urine, random     Status: None   Collection Time: 11/07/15  7:54 AM  Result Value Ref Range   Sodium, Ur <10 mmol/L    Comment: Performed at [facility]  Osmolality, urine     Status: Abnormal   Collection Time: 11/07/15  7:55 AM  Result Value Ref Range   Osmolality, Ur 178 (L) 300 - 900 mOsm/kg    Comment: Performed at [facility]  Troponin I (q 6hr x 3)     Status: None   Collection Time: 11/07/15 12:50 PM  Result Value Ref Range   Troponin I <0.03 <0.03 ng/mL  ECHOCARDIOGRAM COMPLETE     Status: None   Collection Time: 11/07/15  1:38 PM   Result Value Ref Range   Weight 3,488 oz   Height 76 in   BP 116/105 mmHg  MRSA PCR Screening     Status: None   Collection Time: 11/08/15  1:54 AM  Result Value Ref Range   MRSA by PCR NEGATIVE NEGATIVE    Comment:        The GeneXpert MRSA Assay (FDA approved for NASAL specimens only), is one component of a comprehensive MRSA colonization surveillance program. It is not intended to diagnose MRSA infection nor to guide or monitor treatment for MRSA infections.   Basic metabolic panel     Status: None   Collection Time: 11/08/15  3:23 AM  Result Value Ref Range   Sodium 137 135 - 145 mmol/L   Potassium 3.7 3.5 - 5.1 mmol/L   Chloride 104 101 - 111 mmol/L   CO2 26 22 - 32 mmol/L   Glucose, Bld 95 65 - 99 mg/dL   BUN 7 6 - 20 mg/dL   Creatinine, Ser 0.82 0.61 - 1.24 mg/dL   Calcium 9.0 8.9 - 10.3 mg/dL   GFR calc non Af Amer >60 >60 mL/min   GFR calc Af Amer >60 >60 mL/min    Comment: (NOTE) The eGFR has been calculated using the CKD EPI equation. This calculation has not been validated in all clinical situations. eGFR's persistently <60 mL/min signify possible Chronic Kidney Disease.    Anion gap 7 5 - 15  CBC     Status: None   Collection Time: 11/08/15  3:23 AM  Result Value Ref Range   WBC 8.6 4.0 - 10.5 K/uL   RBC 5.15 4.22 - 5.81 MIL/uL   Hemoglobin 14.3 13.0 - 17.0 g/dL   HCT 41.4 39.0 - 52.0 %   MCV 80.4 78.0 - 100.0 fL   MCH 27.8 26.0 - 34.0 pg   MCHC 34.5 30.0 - 36.0 g/dL   RDW 12.6 11.5 - 15.5 %   Platelets 278 150 - 400 K/uL  Magnesium     Status: None   Collection Time: 11/08/15  3:23 AM  Result Value Ref Range   Magnesium 2.3 1.7 - 2.4 mg/dL  Basic metabolic panel     Status: Abnormal   Collection Time: 11/12/15 11:18 PM  Result Value Ref Range   Sodium 135 135 - 145 mmol/L   Potassium 5.4 (H) 3.5 - 5.1 mmol/L   Chloride 101 101 - 111 mmol/L   CO2 24 22 - 32 mmol/L   Glucose, Bld 109 (H) 65 - 99 mg/dL   BUN 9 6 - 20 mg/dL   Creatinine, Ser  0.90 0.61 - 1.24 mg/dL   Calcium 9.5 8.9 - 10.3 mg/dL   GFR  calc non Af Amer >60 >60 mL/min   GFR calc Af Amer >60 >60 mL/min    Comment: (NOTE) The eGFR has been calculated using the CKD EPI equation. This calculation has not been validated in all clinical situations. eGFR's persistently <60 mL/min signify possible Chronic Kidney Disease.    Anion gap 10 5 - 15  CBC     Status: Abnormal   Collection Time: 11/12/15 11:18 PM  Result Value Ref Range   WBC 13.0 (H) 4.0 - 10.5 K/uL   RBC 5.93 (H) 4.22 - 5.81 MIL/uL   Hemoglobin 17.4 (H) 13.0 - 17.0 g/dL   HCT 48.5 39.0 - 52.0 %   MCV 81.8 78.0 - 100.0 fL   MCH 29.3 26.0 - 34.0 pg   MCHC 35.9 30.0 - 36.0 g/dL   RDW 13.3 11.5 - 15.5 %   Platelets 351 150 - 400 K/uL  I-stat troponin, ED     Status: None   Collection Time: 11/12/15 11:22 PM  Result Value Ref Range   Troponin i, poc 0.00 0.00 - 0.08 ng/mL   Comment 3            Comment: Due to the release kinetics of cTnI, a negative result within the first hours of the onset of symptoms does not rule out myocardial infarction with certainty. If myocardial infarction is still suspected, repeat the test at appropriate intervals.   Ethanol     Status: Abnormal   Collection Time: 11/13/15 12:01 AM  Result Value Ref Range   Alcohol, Ethyl (B) 272 (H) <5 mg/dL    Comment:        LOWEST DETECTABLE LIMIT FOR SERUM ALCOHOL IS 5 mg/dL FOR MEDICAL PURPOSES ONLY   D-dimer, quantitative (not at [facility])     Status: None   Collection Time: 11/13/15 12:01 AM  Result Value Ref Range   D-Dimer, Quant <0.27 0.00 - 0.50 ug/mL-FEU    Comment: (NOTE) At the manufacturer cut-off of 0.50 ug/mL FEU, this assay has been documented to exclude PE with a sensitivity and negative predictive value of 97 to 99%.  At this time, this assay has not been approved by the FDA to exclude DVT/VTE. Results should be correlated with clinical presentation.   Urine rapid drug screen (hosp performed)     Status:  Abnormal   Collection Time: 11/13/15 12:19 AM  Result Value Ref Range   Opiates NONE DETECTED NONE DETECTED   Cocaine NONE DETECTED NONE DETECTED   Benzodiazepines POSITIVE (A) NONE DETECTED   Amphetamines NONE DETECTED NONE DETECTED   Tetrahydrocannabinol NONE DETECTED NONE DETECTED   Barbiturates NONE DETECTED NONE DETECTED    Comment:        DRUG SCREEN FOR MEDICAL PURPOSES ONLY.  IF CONFIRMATION IS NEEDED FOR ANY PURPOSE, NOTIFY LAB WITHIN 5 DAYS.        LOWEST DETECTABLE LIMITS FOR URINE DRUG SCREEN Drug Class       Cutoff (ng/mL) Amphetamine      1000 Barbiturate      200 Benzodiazepine   937 Tricyclics       902 Opiates          300 Cocaine          300 THC              50   Potassium     Status: None   Collection Time: 11/13/15 12:32 AM  Result Value Ref Range   Potassium 5.1 3.5 - 5.1 mmol/L  COMPLETE METABOLIC PANEL WITH GFR     Status: None   Collection Time: 12/15/15  4:46 PM  Result Value Ref Range   Sodium 138 135 - 146 mmol/L   Potassium 4.4 3.5 - 5.3 mmol/L   Chloride 99 98 - 110 mmol/L   CO2 28 20 - 31 mmol/L   Glucose, Bld 92 65 - 99 mg/dL   BUN 11 7 - 25 mg/dL   Creat 0.84 0.70 - 1.33 mg/dL    Comment:   For patients > or = 50 years of age: The upper reference limit for Creatinine is approximately 13% higher for people identified as African-American.      Total Bilirubin 0.6 0.2 - 1.2 mg/dL   Alkaline Phosphatase 84 40 - 115 U/L   AST 15 10 - 35 U/L   ALT 15 9 - 46 U/L   Total Protein 7.2 6.1 - 8.1 g/dL   Albumin 4.6 3.6 - 5.1 g/dL   Calcium 9.9 8.6 - 10.3 mg/dL   GFR, Est African American >89 >=60 mL/min   GFR, Est Non African American >89 >=60 mL/min      Constitutional:  There were no vitals taken for this visit.   Musculoskeletal: Strength & Muscle Tone: decreased Gait & Station: normal Patient leans: N/A  Psychiatric Specialty Exam: General Appearance: Fairly Groomed and Guarded  Engineer, water::  Good  Speech:  Clear and  Coherent  Volume:  Normal  Mood:  Anxious and Depressed  Affect:  Constricted and Depressed  Thought Process:  Coherent and Goal Directed  Orientation:  Full (Time, Place, and Person)  Thought Content:  Logical Perceptions: denies AH/VH  Suicidal Thoughts:  No  Homicidal Thoughts:  No  Memory:  Immediate;   Fair Recent;   Fair Remote;   Fair  Judgement:  Fair  Insight:  Fair  Psychomotor Activity:  Decreased  Concentration:  Fair  Recall:  [employer] of Knowledge:  Fair  Language:  Good  Akathisia:  No  Handed:  Right  AIMS (if indicated):     Assets:  Communication Skills Desire for Improvement  ADL's:  Intact  Cognition:  WNL  Sleep:   insomnia     Established Problem, Stable/Improving (1), Review of Psycho-Social Stressors (1), Review and summation of old records (2), Established Problem, Worsening (2), Review of Last Therapy Session (1), Review of Medication Regimen & Side Effects (2) and Review of New Medication or Change in Dosage (2)  Assessment: TEEGAN BRANDIS is a 50 year old male with bipolar disorder, alcohol use disorder, anxiety,  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 He is a patient of Dr. Adele Schilder. Patient was admitted 8/29-9/1 for chest pain, suicidally and received care for alcohol withdrawal in medicine unit.   # GAD # Bipolar II disorder # r/o MDD Patient endorses significant anxiety in the setting of his chronic pain and neuropathy. Will start nortriptyline to target his anxiety, insomnia, depression and pain. Discussed in details about potential cardiac side effects; patient is willing to try this medication (he sees a cardiologist once every three months.) Although plan to continue olanzapine at the current dose given his history of bipolar disorder at this time, this medication may be weaned off in the future given his lack of significant manic episode. Will have Buspar available for his anxiety.  Discussed risk of serotonin syndrome and oversedation with combination of his other medication are discussed. Will not  plan to start benzodiazepine given his history of alcohol use. Noted that he is demoralized by his current chronic pain; referrals for psychotherapy is made. He will also greatly benefit from CBT to target his depression and anxiety.   # Alcohol use disorder Patient denies recent alcohol use and is motivated for sobriety (drank a few beers, few months ago). Will continue motivational interview.  Plans 1. Continue olanzapine 2.5 mg at night 2. Start Pamelor 25 mg for one week, then 50 mg at night 3. Buspar 7.5 mg three times a day as needed for anxiety 4. Return to clinic in one month - Patient will see a therapist for CBT/supportive therapy  The patient demonstrates the following  risk factors for suicide: Chronic risk factors for suicide include psychiatric disorder; bipolar disorder, anxiety, substance use disorder, medical illness of chronic pain, previous SA, demographic factors (male). Acute risk factors for suicide include medical problems.  Protective factors for this patient include positive social support, coping skills, hope for the future, religious beliefs against suicide.  Considering these factors, the overall suicide risk at this point appears to be moderately elevated but not at imminent danger to self. Discussed emergency resources which includes 911, crisis line and coming to ED.    Norman Clay, MD 01/10/2016

## 2016-01-10 NOTE — Patient Instructions (Addendum)
1. Continue olanzapine 2.5 mg at night 2. Start Pamelor 25 mg for one week, then 50 mg at night 3. Buspar 7.5 mg three times a day as needed for anxiety 4. Return to clinic in one month

## 2016-01-11 ENCOUNTER — Ambulatory Visit: Payer: Self-pay | Admitting: Pain Medicine

## 2016-01-11 MED ORDER — TRAMADOL HCL 50 MG PO TABS: 50.0000 mg | ORAL_TABLET | Freq: Two times a day (BID) | ORAL | 0 refills | Status: DC | PRN

## 2016-01-11 MED ORDER — ACETAMINOPHEN-CODEINE #3 300-30 MG PO TABS: 1.0000 | ORAL_TABLET | ORAL | 0 refills | Status: DC | PRN

## 2016-01-11 NOTE — Telephone Encounter (Signed)
Tylenol #3 and tramadol refilled

## 2016-01-12 MED FILL — traMADol HCL 50 MG TABS: 50 | 30 days supply | Qty: 60 | Fill #0

## 2016-01-12 MED FILL — ACETAMINOPHEN/COD #3 TABLET: 300-30 | 20 days supply | Qty: 240 | Fill #0

## 2016-01-12 MED FILL — traZODone HCL 50 MG TABS: 50 | 30 days supply | Qty: 30 | Fill #0

## 2016-01-12 NOTE — Telephone Encounter (Signed)
Pt was called and informed of script for tylenol #3 being ready for pick up. Pt wants to know about a refill for tramadol

## 2016-01-12 NOTE — Telephone Encounter (Signed)
Both were refilled yesterday Should be two scripts

## 2016-01-15 ENCOUNTER — Ambulatory Visit (INDEPENDENT_AMBULATORY_CARE_PROVIDER_SITE_OTHER): Payer: No Typology Code available for payment source | Admitting: Clinical

## 2016-01-15 ENCOUNTER — Encounter (HOSPITAL_COMMUNITY): Payer: Self-pay | Admitting: Clinical

## 2016-01-15 DIAGNOSIS — F1021 Alcohol dependence, in remission: Secondary | ICD-10-CM

## 2016-01-15 DIAGNOSIS — F319 Bipolar disorder, unspecified: Secondary | ICD-10-CM

## 2016-01-16 ENCOUNTER — Ambulatory Visit: Payer: Self-pay | Attending: Family Medicine
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
## 2016-01-30 MED FILL — GABAPENTIN 400 MG CAPSULE: 400 | 30 days supply | Qty: 180 | Fill #5

## 2016-02-07 ENCOUNTER — Telehealth: Payer: Self-pay | Admitting: Family Medicine

## 2016-02-07 DIAGNOSIS — G894 Chronic pain syndrome: Secondary | ICD-10-CM

## 2016-02-07 MED FILL — traZODone HCL 50 MG TABS: 50 | 30 days supply | Qty: 30 | Fill #1

## 2016-02-07 NOTE — Telephone Encounter (Signed)
Patient is needing a refill for tramadol and tylenol 3. Please follow up.

## 2016-02-08 ENCOUNTER — Ambulatory Visit (AMBULATORY_SURGERY_CENTER): Payer: Self-pay | Admitting: *Deleted

## 2016-02-08 ENCOUNTER — Ambulatory Visit (HOSPITAL_COMMUNITY): Payer: Self-pay | Admitting: Clinical

## 2016-02-08 VITALS — Ht 76.5 in | Wt 217.0 lb

## 2016-02-08 DIAGNOSIS — Z1211 Encounter for screening for malignant neoplasm of colon: Secondary | ICD-10-CM

## 2016-02-08 MED ORDER — TRAMADOL HCL 50 MG PO TABS: 50.0000 mg | ORAL_TABLET | Freq: Two times a day (BID) | ORAL | 2 refills | Status: DC | PRN

## 2016-02-08 MED ORDER — NA SULFATE-K SULFATE-MG SULF 17.5-3.13-1.6 GM/177ML PO SOLN: 1.0000 | Freq: Once | ORAL | 0 refills | Status: AC

## 2016-02-08 MED ORDER — ACETAMINOPHEN-CODEINE #3 300-30 MG PO TABS: 1.0000 | ORAL_TABLET | ORAL | 2 refills | Status: DC | PRN

## 2016-02-08 NOTE — Progress Notes (Signed)
No egg or soy allergy known to patient  No issues with past sedation with any surgeries  or procedures, no intubation problems  No diet pills per patient No home 02 use per patient  No blood thinners per patient  Pt denies issues with constipation  No A fib or A flutter   

## 2016-02-08 NOTE — Telephone Encounter (Signed)
Please inform patient that tramadol and tylenol #3 ready for pick up

## 2016-02-08 NOTE — Telephone Encounter (Signed)
Pt called and informed of script being ready

## 2016-02-09 MED FILL — ACETAMINOPHEN/COD #3 TABLET: 300-30 | 20 days supply | Qty: 240 | Fill #0

## 2016-02-09 MED FILL — traMADol HCL 50 MG TABS: 50 | 30 days supply | Qty: 60 | Fill #0

## 2016-02-12 NOTE — Progress Notes (Deleted)
[facility] Progress Note  Mark [name] [id number] 50 y.o.  02/12/2016 12:49 PM  Chief Complaint:  "I'm not well"  History of Present Illness:  Mark [name] came for his follow-up appointment. Patient states he has not felt well since the last appointment. He feels more anxious and endorses insomnia. He complains of worsening chronic pain on his shoulder, neck and his neuropathy. He feels very frustrated that he can't do anything due to his physical state. He has tried physical therapy with very limited effect. He states that he mostly stays at house, watching TV. He has good relationship with his daughters and occasionally lives with them. He states he feels more emotional, feeling something is "out of skin" and has crying spells. He has racing thought and panic attack every day without any significant triggers. He feels overwhelmed about his current situation and would like to try dictation which he has not tried before. He is hoping to be back on Xanax or Ativan, which worked in the past. He reports passive SI, although he denies any intent or plans. He reports worsening concentration and memory. He denies recent alcohol use, last two months ago. He denies drug use.    Weird feeling from buspar, olanzapine   Not taking any medication Depressed, anxiety, on edge, figdety, occasional panic attack, passive SI at times,  Defeated Continue to have pain,  Tries to go out shopping, but is unable to  Walk a dog,  Lives with a mother Visits his ex wife.  ativan Drinks a glass of wine per month,   He reports history of "manic" of being impulsive,  buying things he could not afford. It lasted for a couple of days. It occurred last in late 20's. He denies any decreased need for sleep in the past. He denies any psychiatry admission for mania. He was diagnosed bipolar disorder in 2013, when he answered questionnaire. He has a daughter with bipolar disorder. He has a suicide attempt by trying  to cut his throat in 2014, when his step father intervened.   Naloxone 50 mg daily Sertraline,  abilify tried,  Discontinued olanzapine  Suicidal Ideation: No Plan Formed: No Patient has means to carry out plan: No  Homicidal Ideation: No Plan Formed: No Patient has means to carry out plan: No  Past Psychiatric History/Hospitalization(s): Per chart from Dr. Adele Schilder "Patient endorse history of severe mood swing, anger issues, mania, impulsive behavior during his childhood and growing up.  He endorse hypersexual, excessive buying and excessive shopping when he was adult.  He started drinking heavily and he has history of one psychiatric inpatient treatment at [facility] in 2015 because of alcohol and pain medication abuse.  He denies any history of suicidal attempt but admitted history of suicidal thoughts, paranoia, hallucination and depression.  He had tried Remeron, trazodone, Effexor, Prozac, Paxil with limited response.  Recently we tried Cymbalta but it makes him more anxious and irritable.  [facility].  He also tried [facility] but did not like the service there."  Anxiety: Yes Bipolar Disorder: Yes Depression: Yes Mania: No Psychosis: Yes Schizophrenia: No Personality Disorder: No Hospitalization for psychiatric illness: Yes History of Electroconvulsive Shock Therapy: No Prior Suicide Attempts: No  Family History; Patient reported father and grandfather has history of heavy drinking.  He also mentioned one of his uncle has schizophrenia. He has a daughter with schizophrenia  Medical History; Active Ambulatory Problems    Diagnosis Date Noted  .  Essential hypertension   . Hyperlipidemia   . Tobacco abuse 01/19/2012  . Nonsustained ventricular tachycardia ([place]) 01/20/2012  . 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 Alcohol intoxication ([place]) 06/09/2012  . Chronic pain syndrome 12/28/2012  .  Paroxysmal VT ([place]) 12/19/2013  . Opioid dependence ([place]) 01/22/2014  . 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 Hypokalemia   . Bipolar depression ([place]) 02/08/2014  . Neuropathy ([place]) 02/08/2014  . Pain in joint, shoulder region 05/12/2014  . Chronic radicular low back pain 06/02/2014  . Closed fracture of 5th metacarpal 07/04/2014  . Chronic fatigue 07/04/2014  . Vitamin D insufficiency 07/05/2014  . Poor dentition 07/28/2014  . Chronic maxillary sinusitis 07/28/2014  . 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 Erectile dysfunction 09/16/2014  . Chronic anxiety 12/19/2014  . GERD (gastroesophageal reflux disease) 07/24/2015  . Suicidal ideation 11/07/2015  . Bipolar affective disorder, currently depressed, moderate ([place])   . Non-sustained ventricular tachycardia ([place])   . GAD (generalized anxiety disorder) 12/06/2015  . Alcohol use disorder, severe, in early remission ([place]) 12/06/2015   Resolved Ambulatory Problems    Diagnosis Date Noted  . Mold exposure 06/19/2010  . Atypical chest pain 06/19/2010  . SOB (shortness of breath) 06/19/2010  . Alcohol abuse 01/19/2012  . Abdominal pain 01/20/2012  . HTN (hypertension) 01/20/2012  . 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 Alcohol withdrawal ([place]) 01/21/2012  . Alcoholic hepatitis [id number]  . H/O ETOH abuse 06/09/2012  . Cardiomyopathy, dilated, nonischemic ([place]) 06/09/2012  . Transaminitis 06/09/2012  . Hypokalemia 06/09/2012  . Acute encephalopathy 06/11/2012  . Thrush, oral 06/11/2012  . Acute respiratory failure ([place]) 06/13/2012  . Accelerated hypertension 12/28/2012  . Acute exacerbation of chronic low back pain 12/28/2012  . Diarrhea 12/28/2012  . Nicotine dependence 12/28/2012  . History of alcohol abuse 12/28/2012  . Alcohol dependence ([place]) 01/21/2013  . Seizure due to alcohol withdrawal ([place]) 01/23/2014  . Alcohol dependence with alcohol-induced mood disorder ([place])   . Substance abuse 02/03/2014  . Major depressive  disorder, recurrent, severe without psychotic features ([place])   . Adjustment disorder with depressed mood 02/08/2014  . Essential hypertension 02/08/2014  . Right shoulder pain 06/02/2014  . Wheezing 07/28/2014  . Abdominal distension 09/16/2014   Past Medical History:  Diagnosis Date  . Alcohol withdrawal ([place])   . Allergy   . Anxiety Dx 2014  . COPD (chronic obstructive pulmonary disease) ([place])   . Drug abuse   . Enlarged prostate   . ETOH abuse   . GERD (gastroesophageal reflux disease) Dx 2003  . Headache(784.0)   . Hepatitis   . Hyperlipidemia Dx 2000  . Hypertension Dx 2011  . IBS (irritable bowel syndrome)   . Irregular heart beat   . 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 Mental disorder   . Neuromuscular disorder ([place])   . Neuropathy ([place]) 06/07/2013  . Seizure due to alcohol withdrawal ([place]) 01/23/2014  . Shortness of breath   . Withdrawal seizures ([place]) 2014   Review of Systems: Psychiatric: Agitation: Irritability Hallucination: Here male and a child voice but does not understand what they're talking Depressed Mood: Yes Insomnia: Yes Hypersomnia: No Altered Concentration: No Feels Worthless: Yes Grandiose Ideas: No Belief In Special Powers: No New/Increased Substance Abuse: No Compulsions: No  Neurologic: Headache: No Seizure: History of seizures due to alcohol related Paresthesias: Numbness and neuropathy in his feet ROS + neck pain, back pain, tingling on his foot. Occasional palpitation. Otherwise 14 point system review  are negative.   Outpatient Encounter Prescriptions as of 02/14/2016  Medication Sig  . acetaminophen-codeine (TYLENOL #3) 300-30 MG tablet Take 1-2 tablets by mouth every 4 (four) hours as needed for moderate pain.  . busPIRone (BUSPAR) 7.5 MG tablet Take 1 tablet (7.5 mg total) by mouth 3 (three) times daily. (Patient not taking: Reported on 02/08/2016)  . cyclobenzaprine (FLEXERIL) 10 MG tablet Take 1 tablet (10 mg total) by mouth at  bedtime as needed for muscle spasms.  Marland Kitchen dicyclomine (BENTYL) 20 MG tablet Take 1 tablet (20 mg total) by mouth 3 (three) times daily as needed for spasms.  Marland Kitchen gabapentin (NEURONTIN) 400 MG capsule Take 800 mg by mouth 3 (three) times daily.   . metoprolol (LOPRESSOR) 50 MG tablet Take 1 tablet (50 mg total) by mouth 2 (two) times daily.  . Multiple Vitamin (MULTIVITAMIN WITH MINERALS) TABS tablet Take 1 tablet by mouth daily. (Patient not taking: Reported on 02/08/2016)  . nortriptyline (PAMELOR) 25 MG capsule Take 25 mg at night for one week, then 50 mg at night  . OLANZapine (ZYPREXA) 2.5 MG tablet Take 1 tablet (2.5 mg total) by mouth at bedtime. (Patient not taking: Reported on 02/08/2016)  . tadalafil (CIALIS) 20 MG tablet Take 0.5-1 tablets (10-20 mg total) by mouth every other day as needed for erectile dysfunction.  . traMADol (ULTRAM) 50 MG tablet Take 1 tablet (50 mg total) by mouth 2 (two) times daily as needed for moderate pain.  . traZODone (DESYREL) 50 MG tablet TAKE 1/2-1 TABLET BY MOUTH AT BEDTIME AS NEEDED FOR SLEEP.   No facility-administered encounter medications on file as of 02/14/2016.    Past Medical History:  Diagnosis Date  . Alcohol withdrawal ([place])   . Anxiety Dx 2014  . COPD (chronic obstructive pulmonary disease) ([place])   . Drug abuse    pt reports opioid dependence due to previous back surgeries  . ETOH abuse   . GERD (gastroesophageal reflux disease) Dx 2003  . Headache(784.0)   . Hepatitis   . Hyperlipidemia Dx 2000  . Hypertension Dx 2011  . Irregular heart beat   . 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 Mental disorder   . Neuropathy ([place]) 06/07/2013  . Seizure due to alcohol withdrawal ([place]) 01/23/2014   Per patient report  . Shortness of breath   . Withdrawal seizures ([place]) 2014      Recent Results (from the past 2160 hour(s))  COMPLETE METABOLIC PANEL WITH GFR     Status: None   Collection Time: 12/15/15  4:46 PM  Result Value Ref Range   Sodium 138  135 - 146 mmol/L   Potassium 4.4 3.5 - 5.3 mmol/L   Chloride 99 98 - 110 mmol/L   CO2 28 20 - 31 mmol/L   Glucose, Bld 92 65 - 99 mg/dL   BUN 11 7 - 25 mg/dL   Creat 0.84 0.70 - 1.33 mg/dL    Comment:   For patients > or = 50 years of age: The upper reference limit for Creatinine is approximately 13% higher for people identified as African-American.      Total Bilirubin 0.6 0.2 - 1.2 mg/dL   Alkaline Phosphatase 84 40 - 115 U/L   AST 15 10 - 35 U/L   ALT 15 9 - 46 U/L   Total Protein 7.2 6.1 - 8.1 g/dL   Albumin 4.6 3.6 - 5.1 g/dL   Calcium 9.9 8.6 - 10.3 mg/dL   GFR, Est African American >89 >=  60 mL/min   GFR, Est Non African American >89 >=60 mL/min      Constitutional:  There were no vitals taken for this visit.   Musculoskeletal: Strength & Muscle Tone: decreased Gait & Station: normal Patient leans: N/A  Psychiatric Specialty Exam: General Appearance: Fairly Groomed and Guarded  Engineer, water::  Good  Speech:  Clear and Coherent  Volume:  Normal  Mood:  Anxious and Depressed  Affect:  Constricted and Depressed  Thought Process:  Coherent and Goal Directed  Orientation:  Full (Time, Place, and Person)  Thought Content:  Logical Perceptions: denies AH/VH  Suicidal Thoughts:  No  Homicidal Thoughts:  No  Memory:  Immediate;   Fair Recent;   Fair Remote;   Fair  Judgement:  Fair  Insight:  Fair  Psychomotor Activity:  Decreased  Concentration:  Fair  Recall:  [employer] of Knowledge:  Fair  Language:  Good  Akathisia:  No  Handed:  Right  AIMS (if indicated):     Assets:  Communication Skills Desire for Improvement  ADL's:  Intact  Cognition:  WNL  Sleep:   insomnia     Established Problem, Stable/Improving (1), Review of Psycho-Social Stressors (1), Review and summation of old records (2), Established Problem, Worsening (2), Review of Last Therapy Session (1), Review of Medication Regimen & Side Effects (2) and Review of New Medication or Change in  Dosage (2)  Assessment: Mark [name] is a 50 year old male with bipolar disorder, alcohol use disorder, anxiety,  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 He is a patient of Dr. Adele Schilder. Patient was admitted 8/29-9/1 for chest pain, suicidally and received care for alcohol withdrawal in medicine unit.   # GAD # Bipolar II disorder # r/o MDD Patient endorses significant anxiety in the setting of his chronic pain and neuropathy. Will start nortriptyline to target his anxiety, insomnia, depression and pain. Discussed in details about potential cardiac side effects; patient is willing to try this medication (he sees a cardiologist once every three months.) Although plan to continue olanzapine at the current dose given his history of bipolar disorder at this time, this medication may be weaned off in the future given his lack of significant manic episode. Will have Buspar available for his anxiety. Discussed risk of serotonin syndrome and oversedation with combination of his other medication are discussed. Will not plan to start benzodiazepine given his history of alcohol use. Noted that he is demoralized by his current chronic pain; referrals for psychotherapy is made. He will also greatly benefit from CBT to target his depression and anxiety.   # Alcohol use disorder Patient denies recent alcohol use and is motivated for sobriety (drank a few beers, few months ago). Will continue motivational interview.  Plans 1. Continue olanzapine 2.5 mg at night 2. Start Pamelor 25 mg for one week, then 50 mg at night 3. Buspar 7.5 mg three times a day as needed for anxiety 4. Return to clinic in one month - Patient will see a therapist for CBT/supportive therapy  The patient demonstrates the following  risk factors for suicide: Chronic risk factors for suicide include psychiatric disorder; bipolar disorder, anxiety, substance use disorder, medical  illness of chronic pain, previous SA, demographic factors (male). Acute risk factors for suicide include medical problems.  Protective factors for this patient include positive social support, coping skills, hope for the future, religious beliefs against suicide.  Considering  these factors, the overall suicide risk at this point appears to be moderately elevated but not at imminent danger to self. Discussed emergency resources which includes 911, crisis line and coming to ED.    Norman Clay, MD 02/12/2016

## 2016-02-14 ENCOUNTER — Telehealth: Payer: Self-pay

## 2016-02-14 ENCOUNTER — Ambulatory Visit (INDEPENDENT_AMBULATORY_CARE_PROVIDER_SITE_OTHER): Payer: Self-pay | Admitting: Psychiatry

## 2016-02-14 ENCOUNTER — Encounter (HOSPITAL_COMMUNITY): Payer: Self-pay | Admitting: Psychiatry

## 2016-02-14 VITALS — BP 128/86 | HR 59 | Ht 76.0 in | Wt 218.6 lb

## 2016-02-14 DIAGNOSIS — Z801 Family history of malignant neoplasm of trachea, bronchus and lung: Secondary | ICD-10-CM

## 2016-02-14 DIAGNOSIS — F17210 Nicotine dependence, cigarettes, uncomplicated: Secondary | ICD-10-CM

## 2016-02-14 DIAGNOSIS — Z803 Family history of malignant neoplasm of breast: Secondary | ICD-10-CM

## 2016-02-14 DIAGNOSIS — Z800 Family history of malignant neoplasm of digestive organs: Secondary | ICD-10-CM

## 2016-02-14 DIAGNOSIS — F1021 Alcohol dependence, in remission: Secondary | ICD-10-CM

## 2016-02-14 DIAGNOSIS — Z8371 Family history of colonic polyps: Secondary | ICD-10-CM

## 2016-02-14 DIAGNOSIS — F411 Generalized anxiety disorder: Secondary | ICD-10-CM

## 2016-02-14 DIAGNOSIS — Z79899 Other long term (current) drug therapy: Secondary | ICD-10-CM

## 2016-02-14 DIAGNOSIS — R45851 Suicidal ideations: Secondary | ICD-10-CM

## 2016-02-14 DIAGNOSIS — Z811 Family history of alcohol abuse and dependence: Secondary | ICD-10-CM

## 2016-02-14 DIAGNOSIS — Z888 Allergy status to other drugs, medicaments and biological substances status: Secondary | ICD-10-CM

## 2016-02-14 MED ORDER — ESCITALOPRAM OXALATE 10 MG PO TABS: 10.0000 mg | ORAL_TABLET | Freq: Every day | ORAL | 1 refills | Status: DC

## 2016-02-14 NOTE — Patient Instructions (Addendum)
1. Start lexapro 10 mg daily  2. Return to clinic in January

## 2016-02-14 NOTE — Progress Notes (Signed)
[facility] MD/PA/NP OP Progress Note  02/14/2016 6:19 PM Mark [name]  MRN:  [id number]  Chief Complaint:  Chief Complaint    Follow-up; Depression; Anxiety     Subjective:  "I'm frustrated"  HPI:  Patient states that there has been no change in his mood since the last appointment. He discontinued Buspar and Pamelor, as it made him "weird feeling." Patient has not been taking olanzapine since the last encounter. He feels depressed and anxious, fidgety and have occasional panic attack. He has passive SI. He is very frustrated with his pain which limits his daily activity. He constantly is aware of pain as he feels whenever he tries to do something. He feels "defeated;" he is unable to go out for shopping or walking a dog due to pain. He lives with his mother and visits his ex-wife occasionally. He reports fair sleep. He drinks a glass of wine per month and denies any craving for alcohol. Patient wants to be back on ativan or Xanax which helped for his anxiety.  He reports history of "manic" of being impulsive,  buying things he could not afford. It lasted for a couple of days. It occurred last in late 20's. He denies any decreased need for sleep in the past. He denies any psychiatry admission for mania. He was diagnosed bipolar disorder in 2013, when he answered questionnaire. He has a daughter with bipolar disorder. He has a suicide attempt by trying to cut his throat in 2014, when his step father intervened.   Visit Diagnosis:    ICD-9-CM ICD-10-CM   1. GAD (generalized anxiety disorder) 300.02 F41.1   2. Alcohol use disorder, severe, in early remission ([place]) 305.03 F10.21     Past Psychiatric History:  Per chart from Dr. Adele Schilder "Patient endorse history of severe mood swing, anger issues, mania, impulsive behavior during his childhood and growing up.  He endorse hypersexual, excessive buying and excessive shopping when he was adult.  He started drinking heavily and he has history of one  psychiatric inpatient treatment at behavioral [facility] in 2015 because of alcohol and pain medication abuse.  He denies any history of suicidal attempt but admitted history of suicidal thoughts, paranoia, hallucination and depression.  He had tried Remeron, trazodone, Effexor, Prozac, Paxil with limited response.  Recently we tried Cymbalta but it makes him more anxious and irritable.  [facility] [place].  He also tried [place] but did not like the service there." He has tried Abilify with limited effect.   Past Medical History:  Past Medical History:  Diagnosis Date  . Alcohol withdrawal ([place])   . Allergy   . Anxiety Dx 2014  . COPD (chronic obstructive pulmonary disease) ([place])   . Drug abuse    pt reports opioid dependence due to previous back surgeries  . Enlarged prostate   . ETOH abuse   . GERD (gastroesophageal reflux disease) Dx 2003  . Headache(784.0)   . Hepatitis   . Hyperlipidemia Dx 2000  . Hypertension Dx 2011  . IBS (irritable bowel syndrome)    alternates constiaption/ diarrhea  . Irregular heart beat   . 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 Mental disorder   . Neuromuscular disorder ([place])    hands hurt   . Neuropathy ([place]) 06/07/2013  . Seizure due to alcohol withdrawal ([place]) 01/23/2014   Per patient report  . Shortness of breath   . Withdrawal seizures ([place]) 2014   last seizure 2014 etoh with  drawal    Past Surgical History:  Procedure Laterality Date  . [place] STUDY N/A 01/16/2015   Procedure: [place] STUDY;  Surgeon: Manus Gunning, MD;  Location: [facility];  Service: Gastroenterology;  Laterality: N/A;  . [place]  . COLONOSCOPY  1998  . LEFT HEART CATHETERIZATION WITH CORONARY ANGIOGRAM N/A 01/13/2014   Procedure: LEFT HEART CATHETERIZATION WITH CORONARY ANGIOGRAM;  Surgeon: Clent Demark, MD;  Location: [facility];  Service: Cardiovascular;  Laterality: N/A;  . NASAL SINUS SURGERY    .  NECK SURGERY  2000   cervial fusion   . NISSEN FUNDOPLICATION    . SUBACROMIAL DECOMPRESSION Right 11/16/2014   Procedure: RIGHT SHOULDER ARTHROSCOPY WITH SUBACROMIAL DECOMPRESSION ;  Surgeon: Leandrew Koyanagi, MD;  Location: [facility];  Service: Orthopedics;  Laterality: Right;  . UPPER GASTROINTESTINAL ENDOSCOPY      Family Psychiatric History:  Patient reported father and grandfather has history of heavy drinking.  He also mentioned one of his uncle has schizophrenia. He has a daughter with schizophrenia  Family History:  Family History  Problem Relation Age of Onset  . Lung cancer Father     was a smoker  . Alcohol abuse Father   . Hypertension Mother   . Colon polyps Mother   . Breast cancer Paternal Grandmother   . Alcohol abuse Paternal Grandfather   . Colon cancer Neg Hx   . Rectal cancer Neg Hx   . Stomach cancer Neg Hx     Social History:  Social History   Social History  . Marital status: Divorced    Spouse name: N/A  . Number of children: 2  . Years of education: N/A   Occupational History  . [employer]     Social History Main Topics  . Smoking status: Current Every Day Smoker    Packs/day: 0.50    Years: 30.00    Types: Cigarettes  . Smokeless tobacco: Never Used     Comment: 11-30-2014 info given, Has cut back from 2 pks to 1/2 pack and "working on it"  . Alcohol use No     Comment: occ  . Drug use: No     Comment: [place]  . Sexual activity: No   Other Topics Concern  . None   Social History Narrative  . None    Allergies:  Allergies  Allergen Reactions  . Cozaar [Losartan] Anaphylaxis  . Lisinopril Anaphylaxis    Metabolic Disorder Labs: Lab Results  Component Value Date   HGBA1C 6.0 04/03/2015   MPG 128 (H) 01/22/2014   No results found for: PROLACTIN No results found for: CHOL, TRIG, HDL, CHOLHDL, VLDL, LDLCALC   Current Medications: Current Outpatient Prescriptions  Medication Sig Dispense Refill  .  acetaminophen-codeine (TYLENOL #3) 300-30 MG tablet Take 1-2 tablets by mouth every 4 (four) hours as needed for moderate pain. 240 tablet 2  . cyclobenzaprine (FLEXERIL) 10 MG tablet Take 1 tablet (10 mg total) by mouth at bedtime as needed for muscle spasms. 60 tablet 5  . dicyclomine (BENTYL) 20 MG tablet Take 1 tablet (20 mg total) by mouth 3 (three) times daily as needed for spasms. 90 tablet 1  . gabapentin (NEURONTIN) 400 MG capsule Take 800 mg by mouth 3 (three) times daily.   5  . metoprolol (LOPRESSOR) 50 MG tablet Take 1 tablet (50 mg total) by mouth 2 (two) times daily. 60 tablet 11  . tadalafil (CIALIS)  20 MG tablet Take 0.5-1 tablets (10-20 mg total) by mouth every other day as needed for erectile dysfunction. 30 tablet 3  . traMADol (ULTRAM) 50 MG tablet Take 1 tablet (50 mg total) by mouth 2 (two) times daily as needed for moderate pain. 60 tablet 2  . traZODone (DESYREL) 50 MG tablet TAKE 1/2-1 TABLET BY MOUTH AT BEDTIME AS NEEDED FOR SLEEP. 30 tablet 1  . escitalopram (LEXAPRO) 10 MG tablet Take 1 tablet (10 mg total) by mouth daily. 30 tablet 1  . Multiple Vitamin (MULTIVITAMIN WITH MINERALS) TABS tablet Take 1 tablet by mouth daily. (Patient not taking: Reported on 02/08/2016)     No current facility-administered medications for this visit.     Neurologic: Headache: No Seizure: No Paresthesias: Yes  Musculoskeletal: Strength & Muscle Tone: within normal limits Gait & Station: normal Patient leans: N/A  Psychiatric Specialty Exam: Review of Systems  Musculoskeletal: Positive for back pain.  Neurological: Positive for tingling.  Psychiatric/Behavioral: Positive for depression and suicidal ideas. Negative for hallucinations and substance abuse. The patient is nervous/anxious. The patient does not have insomnia.   All other systems reviewed and are negative.   Blood pressure 128/86, pulse (!) 59, height 6\' 4"  (1.93 m), weight 218 lb 9.6 oz (99.2 kg).Body mass index is  26.61 kg/m.  General Appearance: Casual  Eye Contact:  Good  Speech:  Clear and Coherent  Volume:  Normal  Mood:  "frustrated"  Affect:  Appropriate and less down  Thought Process:  Coherent and Goal Directed  Orientation:  Full (Time, Place, and Person)  Thought Content: Logical Perceptions: denies AH/VH  Suicidal Thoughts:  Yes.  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 Patient was admitted 8/29-9/1 for chest pain, suicidally and received care for alcohol withdrawal in medicine unit.   # GAD # MDD with mixed features # r/p Bipolar II disorder Today's exam is notable for his rumination on his pain, neuropathy and associated mood symptoms. Will discontinue nortriptyline given he has limited effect from this medication. Will try Lexapro at lower dose to target his mood symptoms. Noted that patient denies significant manic episode since childhood; will continue to monitor symptoms. Will not plan to start benzodiazepine at this time given his history of alcohol use. Patient is demoralized by his current physical condition; he started to see a therapist. Discussed behavioral activation, and resources for mindfulness is provided.   # Alcohol use disorder Patient is motivated for sobriety. Will continue motivational interview.  Plans 1. Start Lexapro 10 mg  daily  2. Return to clinic in January 3. Patient to continue to see for therapy  The patient demonstrates the following  risk factors for suicide: Chronic risk factors for suicide include psychiatric disorder; bipolar disorder, anxiety, substance use disorder, medical illness of chronic  pain, previous SA, demographic factors (male). Acute risk factors for suicide include medical problems.  Protective factors for this patient include positive social support, coping skills, hope for the future, religious beliefs against suicide.  Considering these factors, the overall suicide risk at this point appears to be moderately elevated but not at imminent danger to self. Discussed emergency resources which includes 911, crisis line and coming to ED.    Treatment Plan Summary:Plan as above   Norman Clay, MD 02/14/2016, 6:19 PM

## 2016-02-14 NOTE — Telephone Encounter (Signed)
Spoke with patient and told him I would leave a Suprep sample up front to be picked.  Patient agreeed

## 2016-02-19 ENCOUNTER — Encounter: Payer: Self-pay | Admitting: Gastroenterology

## 2016-02-19 ENCOUNTER — Ambulatory Visit (AMBULATORY_SURGERY_CENTER): Payer: Self-pay | Admitting: Gastroenterology

## 2016-02-19 VITALS — BP 123/70 | HR 56 | Temp 97.8°F | Resp 16 | Ht 76.0 in | Wt 217.0 lb

## 2016-02-19 DIAGNOSIS — Z1212 Encounter for screening for malignant neoplasm of rectum: Secondary | ICD-10-CM

## 2016-02-19 DIAGNOSIS — D123 Benign neoplasm of transverse colon: Secondary | ICD-10-CM

## 2016-02-19 DIAGNOSIS — Z1211 Encounter for screening for malignant neoplasm of colon: Secondary | ICD-10-CM

## 2016-02-19 DIAGNOSIS — D125 Benign neoplasm of sigmoid colon: Secondary | ICD-10-CM

## 2016-02-19 MED ORDER — SODIUM CHLORIDE 0.9 % IV SOLN: 500.0000 mL | INTRAVENOUS | Status: DC

## 2016-02-19 NOTE — Progress Notes (Signed)
Called to room to assist during endoscopic procedure.  Patient ID and intended procedure confirmed with present staff. Received instructions for my participation in the procedure from the performing physician.  

## 2016-02-19 NOTE — Progress Notes (Signed)
Report given to PACU RN, vss 

## 2016-02-19 NOTE — Op Note (Signed)
Mark [name] Patient Name: Mark [name] Procedure Date: 02/19/2016 2:31 PM MRN: [id number] Endoscopist: Remo Lipps P. Armbruster MD, MD Age: 50 Referring MD:  Date of Birth: 12-Jan-1966 Gender: Male Account #: [id number] Procedure:                Colonoscopy Indications:              Screening for colorectal malignant neoplasm Medicines:                Monitored Anesthesia Care Procedure:                Pre-Anesthesia Assessment:                           - Prior to the procedure, a History and Physical                            was performed, and patient medications and                            allergies were reviewed. The patient's tolerance of                            previous anesthesia was also reviewed. The risks                            and benefits of the procedure and the sedation                            options and risks were discussed with the patient.                            All questions were answered, and informed consent                            was obtained. Prior Anticoagulants: The patient has                            taken no previous anticoagulant or antiplatelet                            agents. ASA Grade Assessment: III - A patient with                            severe systemic disease. After reviewing the risks                            and benefits, the patient was deemed in                            satisfactory condition to undergo the procedure.                           After obtaining informed consent, the colonoscope  was passed under direct vision. Throughout the                            procedure, the patient's blood pressure, pulse, and                            oxygen saturations were monitored continuously. The                            Model CF-HQ190L [id number]) scope was introduced                            through the anus and advanced to the the cecum,   identified by appendiceal orifice and ileocecal                            valve. The colonoscopy was performed without                            difficulty. The patient tolerated the procedure                            well. The quality of the bowel preparation was                            good. The ileocecal valve, appendiceal orifice, and                            rectum were photographed. Scope In: 2:39:39 PM Scope Out: 2:59:29 PM Scope Withdrawal Time: 0 hours 17 minutes 14 seconds  Total Procedure Duration: 0 hours 19 minutes 50 seconds  Findings:                 The perianal and digital rectal examinations were                            normal.                           A 5 mm polyp was found in the hepatic flexure. The                            polyp was sessile. The polyp was removed with a                            cold snare. Resection and retrieval were complete.                           A 3 mm polyp was found in the sigmoid colon. The                            polyp was sessile. The polyp was removed with a  cold biopsy forceps. Resection and retrieval were                            complete.                           Internal hemorrhoids were found during                            retroflexion. The hemorrhoids were small.                           The exam was otherwise without abnormality. Complications:            No immediate complications. Estimated blood loss:                            Minimal. Estimated Blood Loss:     Estimated blood loss was minimal. Impression:               - One 5 mm polyp at the hepatic flexure, removed                            with a cold snare. Resected and retrieved.                           - One 3 mm polyp in the sigmoid colon, removed with                            a cold biopsy forceps. Resected and retrieved.                           - Internal hemorrhoids.                           - The  examination was otherwise normal. Recommendation:           - Patient has a contact number available for                            emergencies. The signs and symptoms of potential                            delayed complications were discussed with the                            patient. Return to normal activities tomorrow.                            Written discharge instructions were provided to the                            patient.                           - Resume previous diet.                           -  Continue present medications.                           - No ibuprofen, naproxen, or other non-steroidal                            anti-inflammatory drugs for 2 weeks after polyp                            removal.                           - Await pathology results.                           - Repeat colonoscopy is recommended for                            surveillance. The colonoscopy date will be                            determined after pathology results from today's                            exam become available for review. Remo Lipps P. Armbruster MD, MD 02/19/2016 3:02:09 PM This report has been signed electronically.

## 2016-02-19 NOTE — Patient Instructions (Signed)
  NO NSAIDS (MOTRIN, ADVIL, IBUPROFEN, ALEVE, NAPROSYN ETC) FOR TWO WEEKS, 03/04/16.  HANDOUTS GIVEN POLYPS, HEMMORRHOIDS.  YOU HAD AN ENDOSCOPIC PROCEDURE TODAY AT [facility]:   Refer to the procedure report that was given to you for any specific questions about what was found during the examination.  If the procedure report does not answer your questions, please call your gastroenterologist to clarify.  If you requested that your care partner not be given the details of your procedure findings, then the procedure report has been included in a sealed envelope for you to review at your convenience later.  YOU SHOULD EXPECT: Some feelings of bloating in the abdomen. Passage of more gas than usual.  Walking can help get rid of the air that was put into your GI tract during the procedure and reduce the bloating. If you had a lower endoscopy (such as a colonoscopy or flexible sigmoidoscopy) you may notice spotting of blood in your stool or on the toilet paper. If you underwent a bowel prep for your procedure, you may not have a normal bowel movement for a few days.  Please Note:  You might notice some irritation and congestion in your nose or some drainage.  This is from the oxygen used during your procedure.  There is no need for concern and it should clear up in a day or so.  SYMPTOMS TO REPORT IMMEDIATELY:   Following lower endoscopy (colonoscopy or flexible sigmoidoscopy):  Excessive amounts of blood in the stool  Significant tenderness or worsening of abdominal pains  Swelling of the abdomen that is new, acute  Fever of 100F or higher   For urgent or emergent issues, a gastroenterologist can be reached at any hour by calling [phone].   DIET:  We do recommend a small meal at first, but then you may proceed to your regular diet.  Drink plenty of fluids but you should avoid alcoholic beverages for 24 hours.  ACTIVITY:  You should plan to take it easy for the rest of  today and you should NOT DRIVE or use heavy machinery until tomorrow (because of the sedation medicines used during the test).    FOLLOW UP: Our staff will call the number listed on your records the next business day following your procedure to check on you and address any questions or concerns that you may have regarding the information given to you following your procedure. If we do not reach you, we will leave a message.  However, if you are feeling well and you are not experiencing any problems, there is no need to return our call.  We will assume that you have returned to your regular daily activities without incident.  If any biopsies were taken you will be contacted by phone or by letter within the next 1-3 weeks.  Please call us at [phone] if you have not heard about the biopsies in 3 weeks.    SIGNATURES/CONFIDENTIALITY: You and/or your care partner have signed paperwork which will be entered into your electronic medical record.  These signatures attest to the fact that that the information above on your After Visit Summary has been reviewed and is understood.  Full responsibility of the confidentiality of this discharge information lies with you and/or your care-partner.

## 2016-02-20 ENCOUNTER — Telehealth: Payer: Self-pay

## 2016-02-20 NOTE — Telephone Encounter (Signed)
  Follow up Call-  Call back number 02/19/2016 12/12/2014  Post procedure Call Back phone  # [phone]  Permission to leave phone message Yes Yes  Some recent data might be hidden    Patient was called for follow up after his procedure on 02/19/2016. Patient reports that he has returned to his normal daily activities without any complications. Smart text was deleted accidentally.

## 2016-02-26 ENCOUNTER — Encounter: Payer: Self-pay | Admitting: Gastroenterology

## 2016-02-26 ENCOUNTER — Ambulatory Visit (INDEPENDENT_AMBULATORY_CARE_PROVIDER_SITE_OTHER): Payer: Self-pay | Admitting: Clinical

## 2016-02-26 DIAGNOSIS — F1021 Alcohol dependence, in remission: Secondary | ICD-10-CM

## 2016-02-26 DIAGNOSIS — F319 Bipolar disorder, unspecified: Secondary | ICD-10-CM

## 2016-02-26 NOTE — Progress Notes (Signed)
   THERAPIST PROGRESS NOTE  Session Time: 12:00 -12:55  Participation Level: Active  Behavioral Response: CasualAlertAnxious and Depressed  Type of Therapy: Individual therapy  Treatment Goals addressed: Improve psychiatric symptoms, elevate mood (increased energy, increased activity, improved concentration, improved sleep), Improve unhelpful thought patterns,  Mood stabilization (improved mood, decreased anxiety, decreased depression), stress management skills (decreased restlessness, decreased anxiety), learn about diagnosis, healthy coping skills  Interventions: motivational interviewing, cbt, grounding and mindfulness techniques, psychoeducation  Summary: Oronde Hallenbeck is a 50 y.o. male who presents with Bipolar I disorder, Alcohol use disorder, severe in early remission  Suicidal/Homicidal: No without intent/plan  Therapist Response: [name] met with clinician for an individual session. Khai discussed his psychiatric symptoms and  his current life events. Diron shared that he has been experiencing a lot of depression and physical pain. He denied any alcohol use since Sept.  Clinician asked open ended questions and  He shared that he is hoping to get some assistance for the pain. He shared about how how his pain and depression are intertwined. He shared that the pain makes it impossible for him to work or live as actively as he has in the past. Clinician asked open ended questions and Travonte identified times that he was less focused on the pain and was able to engage more. Clinician introduced mindfulness and grounding techniques. Client and clinician practiced some of the techniques together. Carles agreed to practice the techniques daily until next session. Clinician also gave Brayant a homework packet on Bipolar which he agreed to complete before next session   Plan: Return in 2-3 weeks.  Diagnosis: Axis I: Bipolar I disorder, Alcohol use disorder, severe in early remission    Baelynn Schmuhl  A, LCSW 02/26/2016

## 2016-02-29 ENCOUNTER — Other Ambulatory Visit: Payer: Self-pay | Admitting: Family Medicine

## 2016-02-29 ENCOUNTER — Telehealth: Payer: Self-pay | Admitting: Family Medicine

## 2016-02-29 DIAGNOSIS — G629 Polyneuropathy, unspecified: Secondary | ICD-10-CM

## 2016-02-29 MED FILL — GABAPENTIN 400 MG CAPSULE: 400 | 30 days supply | Qty: 180 | Fill #0

## 2016-02-29 NOTE — Telephone Encounter (Signed)
Pt calling to request refill on Tramadol and Tylenol #3 to be picked up on week of 03/06/2016

## 2016-02-29 NOTE — Telephone Encounter (Signed)
Please inform patient that he has 2 refills placed on tramadol and tylenol #3 prescribed on 02/08/2016. So he has refills for end of November-December, end of December to January, end of January to February.   He needs to call his pharmacy to refill Rx, the clinic is open on 03/06/2016.

## 2016-03-06 ENCOUNTER — Other Ambulatory Visit: Payer: Self-pay | Admitting: Family Medicine

## 2016-03-06 DIAGNOSIS — F319 Bipolar disorder, unspecified: Secondary | ICD-10-CM

## 2016-03-06 MED FILL — METOPROLOL TARTRATE 50 MG T: 50 | 30 days supply | Qty: 60 | Fill #0

## 2016-03-06 MED FILL — ACETAMINOPHEN/COD #3 TABLET: 300-30 | 20 days supply | Qty: 240 | Fill #1

## 2016-03-06 MED FILL — ?CYCLOBENZAPRINE 10 MG TABL: 10 | 30 days supply | Qty: 30 | Fill #2

## 2016-03-06 MED FILL — traZODone HCL 50 MG TABS: 50 | 30 days supply | Qty: 30 | Fill #0

## 2016-03-06 NOTE — Telephone Encounter (Signed)
Pt was called and informed to contact pharmacy for his refills on his medication, pt understood information and stated that he would contact the pharmacy.

## 2016-03-08 MED FILL — traMADol HCL 50 MG TABS: 50 | 30 days supply | Qty: 60 | Fill #1

## 2016-03-18 ENCOUNTER — Encounter: Payer: Self-pay | Admitting: Pain Medicine

## 2016-03-18 ENCOUNTER — Other Ambulatory Visit
Admission: RE | Admit: 2016-03-18 | Discharge: 2016-03-18 | Disposition: A | Discharge status: Home / Self Care | Payer: Self-pay | Source: Ambulatory Visit | Attending: Pain Medicine | Admitting: Pain Medicine

## 2016-03-18 ENCOUNTER — Ambulatory Visit: Payer: Self-pay | Attending: Pain Medicine | Admitting: Pain Medicine

## 2016-03-18 ENCOUNTER — Ambulatory Visit
Admission: RE | Admit: 2016-03-18 | Discharge: 2016-03-18 | Disposition: A | Discharge status: Home / Self Care | Payer: Self-pay | Source: Ambulatory Visit | Attending: Pain Medicine | Admitting: Pain Medicine

## 2016-03-18 VITALS — BP 162/95 | HR 71 | Temp 98.3°F | Resp 16 | Ht 76.0 in | Wt 217.0 lb

## 2016-03-18 DIAGNOSIS — M25562 Pain in left knee: Secondary | ICD-10-CM | POA: Insufficient documentation

## 2016-03-18 DIAGNOSIS — Z981 Arthrodesis status: Secondary | ICD-10-CM

## 2016-03-18 DIAGNOSIS — M542 Cervicalgia: Secondary | ICD-10-CM

## 2016-03-18 DIAGNOSIS — M25511 Pain in right shoulder: Secondary | ICD-10-CM

## 2016-03-18 DIAGNOSIS — M961 Postlaminectomy syndrome, not elsewhere classified: Secondary | ICD-10-CM | POA: Insufficient documentation

## 2016-03-18 DIAGNOSIS — G8929 Other chronic pain: Secondary | ICD-10-CM | POA: Insufficient documentation

## 2016-03-18 DIAGNOSIS — I708 Atherosclerosis of other arteries: Secondary | ICD-10-CM | POA: Insufficient documentation

## 2016-03-18 DIAGNOSIS — M5442 Lumbago with sciatica, left side: Secondary | ICD-10-CM

## 2016-03-18 DIAGNOSIS — Q761 Klippel-Feil syndrome: Secondary | ICD-10-CM

## 2016-03-18 DIAGNOSIS — E559 Vitamin D deficiency, unspecified: Secondary | ICD-10-CM

## 2016-03-18 DIAGNOSIS — M25561 Pain in right knee: Secondary | ICD-10-CM

## 2016-03-18 DIAGNOSIS — F319 Bipolar disorder, unspecified: Secondary | ICD-10-CM

## 2016-03-18 DIAGNOSIS — M545 Low back pain: Secondary | ICD-10-CM | POA: Insufficient documentation

## 2016-03-18 DIAGNOSIS — Z9889 Other specified postprocedural states: Secondary | ICD-10-CM | POA: Insufficient documentation

## 2016-03-18 DIAGNOSIS — F1021 Alcohol dependence, in remission: Secondary | ICD-10-CM

## 2016-03-18 DIAGNOSIS — F3130 Bipolar disorder, current episode depressed, mild or moderate severity, unspecified: Secondary | ICD-10-CM

## 2016-03-18 DIAGNOSIS — G894 Chronic pain syndrome: Secondary | ICD-10-CM

## 2016-03-18 DIAGNOSIS — F11281 Opioid dependence with opioid-induced sexual dysfunction: Secondary | ICD-10-CM

## 2016-03-18 DIAGNOSIS — F17210 Nicotine dependence, cigarettes, uncomplicated: Secondary | ICD-10-CM | POA: Insufficient documentation

## 2016-03-18 DIAGNOSIS — Z79899 Other long term (current) drug therapy: Secondary | ICD-10-CM | POA: Insufficient documentation

## 2016-03-18 DIAGNOSIS — M5441 Lumbago with sciatica, right side: Secondary | ICD-10-CM | POA: Insufficient documentation

## 2016-03-18 DIAGNOSIS — I700 Atherosclerosis of aorta: Secondary | ICD-10-CM | POA: Insufficient documentation

## 2016-03-18 DIAGNOSIS — M47896 Other spondylosis, lumbar region: Secondary | ICD-10-CM | POA: Insufficient documentation

## 2016-03-18 DIAGNOSIS — R45851 Suicidal ideations: Secondary | ICD-10-CM

## 2016-03-18 DIAGNOSIS — Z79891 Long term (current) use of opiate analgesic: Secondary | ICD-10-CM

## 2016-03-18 DIAGNOSIS — I4581 Long QT syndrome: Secondary | ICD-10-CM

## 2016-03-18 DIAGNOSIS — M47892 Other spondylosis, cervical region: Secondary | ICD-10-CM | POA: Insufficient documentation

## 2016-03-18 DIAGNOSIS — Z720 Tobacco use: Secondary | ICD-10-CM

## 2016-03-18 DIAGNOSIS — N522 Drug-induced erectile dysfunction: Secondary | ICD-10-CM

## 2016-03-18 LAB — COMPREHENSIVE METABOLIC PANEL
ALT: 16 U/L — ABNORMAL LOW (ref 17–63)
ANION GAP: 9 (ref 5–15)
AST: 21 U/L (ref 15–41)
Albumin: 4.8 g/dL (ref 3.5–5.0)
Alkaline Phosphatase: 85 U/L (ref 38–126)
BUN: 12 mg/dL (ref 6–20)
CALCIUM: 9.4 mg/dL (ref 8.9–10.3)
CO2: 28 mmol/L (ref 22–32)
Chloride: 99 mmol/L — ABNORMAL LOW (ref 101–111)
Creatinine, Ser: 0.95 mg/dL (ref 0.61–1.24)
Glucose, Bld: 127 mg/dL — ABNORMAL HIGH (ref 65–99)
Potassium: 3.9 mmol/L (ref 3.5–5.1)
SODIUM: 136 mmol/L (ref 135–145)
Total Bilirubin: 0.8 mg/dL (ref 0.3–1.2)
Total Protein: 7.5 g/dL (ref 6.5–8.1)

## 2016-03-18 LAB — C-REACTIVE PROTEIN: CRP: 0.9 mg/dL (ref <1.0)

## 2016-03-18 LAB — VITAMIN B12: Vitamin B-12: 244 pg/mL (ref 180–914)

## 2016-03-18 LAB — MAGNESIUM: Magnesium: 2.1 mg/dL (ref 1.7–2.4)

## 2016-03-18 LAB — SEDIMENTATION RATE: Sed Rate: 2 mm/hr (ref 0–20)

## 2016-03-18 NOTE — Progress Notes (Signed)
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 INTRADISCAL PRESSURE TO 100  PSI. THERE IS A NORMAL NUCLEAR PATTERN. THE PATIENT FELT SOME MILD BACK PRESSURE BUT DID NOT FEEL HIS FAMILIAR PAIN. DIAGNOSTIC DISC INJECTION L5-S1: AN OBLIQUE APPROACH WAS TAKEN USING A CURVED 22 GAUGE CHIBA NEEDLE. THIS WAS POSITIONED IN THE NUCLEUS OF THE [place]. CONTRAST MIXED WITH DILUTE ANCEF WAS INJECTED DURING DIRECT OBSERVATION AND PRESSURE MONITORING. OPENING PRESSURE WAS 10 PSI. THE DISC ACCOMMODATED 3 CC OF CONTRAST WITH ELEVATION OF INTRADISCAL PRESSURE ONLY TO 40 PSI. THE PATIENT REPORTED REPRODUCTION OF HIS FAMILIAR HOME PAIN AFFECTING HIS LOW BACK.  THERE WAS SOME RADICULAR SYMPTOMATOLOGY TOWARDS THE LEFT. IMAGING SHOWS AN ABNORMAL PATTERN OF CONTRAST EXTENSION WITH THE CONTRAST PERVADING THE NUCLEAR REGION AND EXTENDING INTO ANNULAR REGIONS, PARTICULARLY IN THE LEFT POSTEROLATERAL DIRECTION. IMPRESSION   Lumbar DG Epidurogram OP:  Results for orders placed in visit on 05/15/01  DG Epidurography   Narrative FINDINGS CLINICAL DATA:  BACK AND BILATERAL LEG PAIN. THE PATIENT REPORTS NO SIGNIFICANT IMPROVEMENT FOLLOWING THE RIGHT L4 SELECTIVE NERVE ROOT BLOCK AND TRANSFORAMINAL EPIDURAL.  IN ADDITION, NOW HE FEELS LIKE HE HAS INCREASED PAIN ON THE LEFT.  I ELECTED TO ATTEMPT A CAUDAL EPIDURAL TODAY. CAUDAL EPIDURAL NOTE IS MADE THAT THE PATIENT HAS A VERY LOW TOLERANCE FOR PAIN DUE TO NEEDLES. FOLLOWING INFORMED CONSENT, STERILE PREPARATION OF THE SACROCOCCYGEAL AREA, AND ADEQUATE LOCAL ANESTHESIA, A 22 GAUGE SPINAL NEEDLE WAS PLACED IN THE CAUDAL EPIDURAL SPACE.  CONTRAST INJECTION SHOWED GOOD CEPHALAD SPREAD TO L5-S1. I INJECTED 120 MG OF DEPO-MEDROL ALONG WITH 3 CC OF 1 PERCENT LIDOCAINE.  THE PATIENT TOLERATED THIS WITH SOME DIFFICULTY.  POST PROCEDURE HE WAS ABLE TO AMBULATE OF HIS OWN ACCORD, BUT WAS NOT SIGNIFICANTLY IMPROVED. THE PATIENT WAS COUNSELED REGARDING EXPECTED TIME COURSE OF STEROID AND ANESTHETIC. IMPRESSION TECHNICALLY SUCCESSFUL CAUDAL EPIDURAL; THIS IS  #2 IN A SERIES OF INJECTIONS FOR THIS PATIENT.   Note: Available results from prior imaging studies were reviewed.        ROS  Cardiovascular History: Heart trouble, Hypertension, Chest pain and Heart valve problems Pulmonary or Respiratory History: Shortness of breath, Smoker, Snoring  and Sleep apnea Neurological History: Negative for epilepsy, stroke, urinary or fecal inontinence, spina bifida or tethered cord syndrome Review of Past Neurological Studies: No results found for this or any previous visit. Psychological-Psychiatric History: Anxiety, Depression and Panic Attacks Gastrointestinal History: Reflux or heatburn and Irritable Bowel Syndrome (IBS) Genitourinary History: Negative for nephrolithiasis, hematuria, renal failure or chronic kidney disease Hematological History: Negative for anticoagulant therapy, anemia, bruising or bleeding easily, hemophilia, sickle cell disease or trait, thrombocytopenia or coagulupathies Endocrine History: Negative for diabetes or thyroid disease  Rheumatologic History: Fibromyalgia and Chronic Fatigue Syndrome Musculoskeletal History: Negative for myasthenia gravis, muscular dystrophy, multiple sclerosis or malignant hyperthermia Work History: Disabled  Allergies  Mark [name] is allergic to cozaar [losartan] and lisinopril.  Laboratory Chemistry  Inflammation Markers Lab Results  Component Value Date   ESRSEDRATE 2 03/18/2016   Renal Function Lab Results  Component Value Date   BUN 12 03/18/2016   CREATININE 0.95 03/18/2016   GFRAA >60 03/18/2016   GFRNONAA >60 03/18/2016   Hepatic Function Lab Results  Component Value Date   AST 21 03/18/2016   ALT 16 (L) 03/18/2016   ALBUMIN 4.8 03/18/2016   Electrolytes Lab Results  Component Value Date   NA 136 03/18/2016   K 3.9 03/18/2016   CL 99 (L) 03/18/2016   CALCIUM 9.4 03/18/2016   MG 2.1 03/18/2016   Pain Modulating Vitamins Lab Results  Component Value Date   VD25OH 21 (L)  07/04/2014   VITAMINB12 418 04/03/2015   Coagulation Parameters Lab Results  Component Value Date   INR 1.04 06/07/2013   LABPROT 13.4 06/07/2013   PLT 351 11/12/2015   Cardiovascular Lab Results  Component Value Date   BNP 23.9 02/27/2015   HGB 17.4 (H) 11/12/2015   HCT 48.5 11/12/2015   Note: Lab results reviewed.  [place]  Drug: Mark [name]  reports that he does not use drugs. Alcohol:  reports that he does not drink alcohol. Tobacco:  reports that he has been smoking Cigarettes.  He has a 15.00 pack-year smoking history. He has never used smokeless tobacco. Medical:  has a past medical history of Alcohol withdrawal ([place]); Allergy; Anxiety (Dx 2014); COPD (chronic obstructive pulmonary disease) ([place]); Drug abuse; Enlarged prostate; ETOH abuse; GERD (gastroesophageal reflux disease) (Dx 2003); Headache(784.0); Hepatitis; Hyperlipidemia (Dx 2000); Hypertension (Dx 2011); IBS (irritable bowel syndrome); Irregular heart beat; Long Q-T syndrome (01/23/2014); Mental disorder; Neuromuscular disorder ([place]); Neuropathy ([place]) (06/07/2013); Seizure due to alcohol withdrawal ([place]) (01/23/2014); Shortness of breath; and Withdrawal seizures ([place]) (2014). Family: family history includes Alcohol abuse in his father and paternal grandfather; Breast cancer in his paternal grandmother; Colon polyps in his mother; Hypertension in his mother; Lung cancer in his father.  Past Surgical History:  Procedure Laterality Date  . [place] STUDY N/A 01/16/2015   Procedure: [place] STUDY;  Surgeon: Manus Gunning, MD;  Location: [facility];  Service: Gastroenterology;  Laterality: N/A;  . [place]  . COLONOSCOPY  1998  . LEFT HEART CATHETERIZATION WITH CORONARY ANGIOGRAM N/A 01/13/2014   Procedure: LEFT HEART CATHETERIZATION WITH CORONARY ANGIOGRAM;  Surgeon: Clent Demark, MD;  Location: [facility];  Service: Cardiovascular;  Laterality: N/A;  . NASAL SINUS SURGERY    . NECK  SURGERY  2000   cervial fusion   . NISSEN FUNDOPLICATION    . SUBACROMIAL DECOMPRESSION Right 11/16/2014   Procedure: RIGHT SHOULDER ARTHROSCOPY WITH SUBACROMIAL DECOMPRESSION ;  Surgeon: Leandrew Koyanagi, MD;  Location: [facility];  Service: Orthopedics;  Laterality: Right;  . UPPER GASTROINTESTINAL ENDOSCOPY     Active Ambulatory Problems    Diagnosis Date Noted  . Essential hypertension   . Hyperlipidemia   . Tobacco abuse 01/19/2012  . Nonsustained ventricular tachycardia ([place]) 01/20/2012  . 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 Alcohol intoxication ([place]) 06/09/2012  . Chronic bilateral low back pain with bilateral sciatica 12/28/2012  . Chronic pain syndrome 12/28/2012  . Paroxysmal VT ([place]) 12/19/2013  .  Opioid dependence ([place]) 01/22/2014  . 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 Hypokalemia   . Bipolar depression ([place]) 02/08/2014  . Neuropathy ([place]) 02/08/2014  . Chronic right shoulder pain 05/12/2014  . Chronic radicular low back pain 06/02/2014  . Closed fracture of 5th metacarpal 07/04/2014  . Chronic fatigue 07/04/2014  . Vitamin D insufficiency 07/05/2014  . Poor dentition 07/28/2014  . Chronic maxillary sinusitis 07/28/2014  . 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 Erectile dysfunction 09/16/2014  . Chronic anxiety 12/19/2014  . GERD (gastroesophageal reflux disease) 07/24/2015  . Suicidal ideation 11/07/2015  . Bipolar affective disorder, currently depressed, moderate ([place])   . Non-sustained ventricular tachycardia ([place])   . GAD (generalized anxiety disorder) 12/06/2015  . Alcohol use disorder, severe, in early remission ([place]) 12/06/2015  . Long term current use of opiate analgesic 03/18/2016  . Long term prescription opiate use 03/18/2016  . History of fusion of cervical spine 03/18/2016  . Chronic neck pain 03/18/2016  . Cervical fusion syndrome 03/18/2016  . 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 Chronic pain of both knees  03/18/2016   Resolved Ambulatory Problems    Diagnosis Date Noted  . Mold exposure 06/19/2010  . Atypical chest pain 06/19/2010  . SOB (shortness of breath) 06/19/2010  . Alcohol abuse 01/19/2012  . Abdominal pain 01/20/2012  . HTN (hypertension) 01/20/2012  . 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 Alcohol withdrawal ([place]) 01/21/2012  . Alcoholic hepatitis 50/38/8828  . H/O ETOH abuse 06/09/2012  . Cardiomyopathy, dilated, nonischemic ([place]) 06/09/2012  . Transaminitis 06/09/2012  . Hypokalemia 06/09/2012  . Acute encephalopathy 06/11/2012  . Thrush, oral 06/11/2012  . Acute respiratory failure ([place]) 06/13/2012  . Accelerated hypertension 12/28/2012  . Diarrhea 12/28/2012  . Nicotine dependence 12/28/2012  . History of alcohol abuse 12/28/2012  . Alcohol dependence ([place]) 01/21/2013  . Seizure due to alcohol withdrawal ([place]) 01/23/2014  . Alcohol dependence with alcohol-induced mood disorder ([place])   . Substance abuse 02/03/2014  . Major depressive disorder, recurrent, severe without psychotic features ([place])   . Adjustment disorder with depressed mood 02/08/2014  . Essential hypertension 02/08/2014  . Right shoulder pain 06/02/2014  . Wheezing 07/28/2014  . Abdominal distension 09/16/2014   Past Medical History:  Diagnosis Date  . Alcohol withdrawal ([place])   . Allergy   . Anxiety Dx 2014  . COPD (chronic obstructive pulmonary disease) ([place])   . Drug abuse   . Enlarged prostate   . ETOH abuse   . GERD (gastroesophageal reflux disease) Dx 2003  . Headache(784.0)   . Hepatitis   . Hyperlipidemia Dx 2000  . Hypertension Dx 2011  . IBS (irritable bowel syndrome)   . Irregular heart beat   . 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 Mental disorder   . Neuromuscular disorder ([place])   . Neuropathy ([place]) 06/07/2013  . Seizure due to alcohol withdrawal ([place]) 01/23/2014  . Shortness of breath   . Withdrawal seizures ([place]) 2014   Constitutional Exam  General appearance:  Well nourished, well developed, and well hydrated. In no apparent acute distress Vitals:   03/18/16 1134  BP: (!) 162/95  Pulse: 71  Resp: 16  Temp: 98.3 F (36.8 C)  TempSrc: Oral  SpO2: 100%  Weight: 217 lb (98.4 kg)  Height: _0  (1.93 m)   BMI Assessment: Estimated body mass index is 26.41 kg/m as calculated from the following:   Height as of this encounter: _1  (1.93 m).   Weight as of  this encounter: 217 lb (98.4 kg).  BMI interpretation table: BMI level Category Range association with higher incidence of chronic pain  <18 kg/m2 Underweight   18.5-24.9 kg/m2 Ideal body weight   25-29.9 kg/m2 Overweight Increased incidence by 20%  30-34.9 kg/m2 Obese (Class I) Increased incidence by 68%  35-39.9 kg/m2 Severe obesity (Class II) Increased incidence by 136%  >40 kg/m2 Extreme obesity (Class III) Increased incidence by 254%   BMI Readings from Last 4 Encounters:  03/18/16 26.41 kg/m  02/19/16 26.41 kg/m  02/08/16 26.07 kg/m  12/15/15 26.49 kg/m   Wt Readings from Last 4 Encounters:  03/18/16 217 lb (98.4 kg)  02/19/16 217 lb (98.4 kg)  02/08/16 217 lb (98.4 kg)  12/15/15 217 lb 9.6 oz (98.7 kg)  Psych/Mental status: Alert, oriented x 3 (person, place, & time) Eyes: PERLA Respiratory: No evidence of acute respiratory distress  Cervical Spine Exam  Inspection: Well healed scar from previous spine surgery detected Alignment: Symmetrical Functional ROM: Decreased ROM Stability: No instability detected Muscle strength & Tone: Functionally intact Sensory: Movement-associated discomfort Palpation: Complains of area being tender to palpation  Upper Extremity (UE) Exam    Side: Right upper extremity  Side: Left upper extremity  Inspection: No masses, redness, swelling, or asymmetry  Inspection: No masses, redness, swelling, or asymmetry  Functional ROM: Unrestricted ROM          Functional ROM: Unrestricted ROM          Muscle strength & Tone: Functionally intact   Muscle strength & Tone: Functionally intact  Sensory: Unimpaired  Sensory: Unimpaired  Palpation: Non-contributory  Palpation: Non-contributory   Thoracic Spine Exam  Inspection: No masses, redness, or swelling Alignment: Symmetrical Functional ROM: Unrestricted ROM Stability: No instability detected Sensory: Unimpaired Muscle strength & Tone: Functionally intact Palpation: Non-contributory  Lumbar Spine Exam  Inspection: Well healed scar from previous spine surgery detected Alignment: Symmetrical Functional ROM: Decreased ROM Stability: No instability detected Muscle strength & Tone: Functionally intact Sensory: Movement-associated pain Palpation: Complains of area being tender to palpation Provocative Tests: Lumbar Hyperextension and rotation test: evaluation deferred today       Mark [name]: evaluation deferred today              Gait & Posture Assessment  Ambulation: Patient ambulates using a cane Gait: Antalgic Posture: Antalgic   Lower Extremity Exam    Side: Right lower extremity  Side: Left lower extremity  Inspection: No masses, redness, swelling, or asymmetry  Inspection: No masses, redness, swelling, or asymmetry  Functional ROM: Unrestricted ROM          Functional ROM: Unrestricted ROM          Muscle strength & Tone: Functionally intact  Muscle strength & Tone: Functionally intact  Sensory: Unimpaired  Sensory: Unimpaired  Palpation: Non-contributory  Palpation: Non-contributory   Assessment  Primary Diagnosis & Pertinent Problem List: The primary encounter diagnosis was Chronic pain syndrome. Diagnoses of Long term current use of opiate analgesic, Long term prescription opiate use, History of fusion of cervical spine, Chronic neck pain, Cervical fusion syndrome, Chronic bilateral low back pain with bilateral sciatica, Failed back surgical syndrome, Chronic right shoulder pain, Chronic pain of both knees, Alcohol use disorder, severe, in early remission  ([place]), Drug-induced erectile dysfunction, Long Q-T syndrome, Opioid dependence with opioid-induced sexual dysfunction ([place]), Suicidal ideation, Tobacco abuse, Bipolar depression ([place]), and Vitamin D insufficiency were also pertinent to this visit.  Visit Diagnosis: 1. Chronic pain syndrome   2. Long  term current use of opiate analgesic   3. Long term prescription opiate use   4. History of fusion of cervical spine   5. Chronic neck pain   6. Cervical fusion syndrome   7. Chronic bilateral low back pain with bilateral sciatica   8. Failed back surgical syndrome   9. Chronic right shoulder pain   10. Chronic pain of both knees   11. Alcohol use disorder, severe, in early remission ([place])   12. Drug-induced erectile dysfunction   13. Long Q-T syndrome   14. Opioid dependence with opioid-induced sexual dysfunction ([place])   15. Suicidal ideation   16. Tobacco abuse   17. Bipolar depression ([place])   18. Vitamin D insufficiency    Plan of Care  Initial treatment plan:  Please be advised that as per protocol, today's visit has been an evaluation only. We have not taken over the patient's controlled substance management.  Problem-specific plan: No problem-specific Assessment & Plan notes found for this encounter.  Ordered Lab-work, Procedure(s), Referral(s), & Consult(s): Orders Placed This Encounter  Procedures  . DG Cervical Spine Complete  . DG Lumbar Spine Complete W/Bend  . DG Shoulder Right  . DG Knee 1-2 Views Left  . DG Knee 1-2 Views Right  . Compliance Drug Analysis, Ur  . Comprehensive metabolic panel  . C-reactive protein  . Magnesium  . Sedimentation rate  . Vitamin B12  . 25-Hydroxyvitamin D Lcms D2+D3  . Ambulatory referral to Psychology   Pharmacotherapy: Medications ordered:  No orders of the defined types were placed in this encounter.  Medications administered during this visit: Mr. Wojnarowski had no medications administered during this visit.    Pharmacotherapy under consideration:  Opioid Analgesics: The patient was informed that there is no guarantee that he would be a candidate for opioid analgesics. The decision will be made following CDC guidelines. This decision will be based on the results of diagnostic studies, as well as Mr. Johanson risk profile. This patient is at high risk of substance use disorder. Chronic nicotine dependence with persistent use, history of alcoholism, sleep apnea, bipolar disorder, suicide ideations, & prolonged QT syndrome. Membrane stabilizer: Continue with the Neurontin and perhaps go to 800 mg 4 times a day Muscle relaxant: Continue with Flexeril 10 mg, 1 daily when necessary NSAID: Medically contraindicated due to history of GERD and Nissen fundoplication. Other analgesic(s): To be determined at a later time   Interventional therapies under consideration: Mr. Trulson was informed that there is no guarantee that he would be a candidate for interventional therapies. The decision will be based on the results of diagnostic studies, as well as Mr. Warrell risk profile.  Possible procedure(s): Lower extremity EMG/PNCV. (Radiculopathy versus peripheral neuropathy) Upper extremity EMG/PNCV. Diagnostic caudal epidural steroid injection + diagnostic epidurogram Possible Racz procedure Diagnostic cervical epidural steroid injection  Diagnostic bilateral cervical facet block  Possible bilateral cervical facet RFA Diagnostic bilateral lumbar facet block  Possible bilateral lumbar facet RFA  Diagnostic right intra-articular shoulder joint injection Diagnostic right suprascapular nerve block Possible right suprascapular RFA Diagnostic bilateral intra-articular knee joint injection  Possible bilateral series of 5 Hyalgan knee injections  Diagnostic bilateral Genicular nerve block  Possible bilateral Genicular RFA    Provider-requested follow-up: Return for 2nd Visit, after [facility]  evaluation.  Future Appointments Date Time Provider [place]  03/21/2016 11:00 AM Jerel Shepherd, LCSW [id number] None  04/10/2016 1:00 PM Norman Clay, MD [facility] None    Primary Care Physician: Boykin Nearing, MD  Location: [facility] Note by: Ruthanna Macchia A. Dossie Arbour, M.D, DABA, DABAPM, DABPM, DABIPP, FIPP Date: 03/18/16; Time: 5:18 PM  Pain Score Disclaimer: We use the NRS-11 scale. This is a self-reported, subjective measurement of pain severity with only modest accuracy. It is used primarily to identify changes within a particular patient. It must be understood that outpatient pain scales are significantly less accurate that those used for research, where they can be applied under ideal controlled circumstances with minimal exposure to variables. In reality, the score is likely to be a combination of pain intensity and pain affect, where pain affect describes the degree of emotional arousal or changes in action readiness caused by the sensory experience of pain. Factors such as social and work situation, setting, emotional state, anxiety levels, expectation, and prior pain experience may influence pain perception and show large inter-individual differences that may also be affected by time variables.  Patient instructions provided during this appointment: There are no Patient Instructions on file for this visit.

## 2016-03-18 NOTE — [place] Notes (Signed)
Safety precautions to be maintained throughout the outpatient stay will include: orient to surroundings, keep bed in low position, maintain call bell within reach at all times, provide assistance with transfer out of bed and ambulation.   Patient has history of taking methadone and ETOH abuse back in April 2014.  Methadone was prescribed.

## 2016-03-21 ENCOUNTER — Telehealth (HOSPITAL_COMMUNITY): Payer: Self-pay

## 2016-03-21 ENCOUNTER — Ambulatory Visit (INDEPENDENT_AMBULATORY_CARE_PROVIDER_SITE_OTHER): Payer: Self-pay | Admitting: Clinical

## 2016-03-21 DIAGNOSIS — F319 Bipolar disorder, unspecified: Secondary | ICD-10-CM

## 2016-03-21 DIAGNOSIS — F1021 Alcohol dependence, in remission: Secondary | ICD-10-CM

## 2016-03-21 LAB — 25-HYDROXYVITAMIN D LCMS D2+D3: 25-HYDROXY, VITAMIN D: 35 ng/mL

## 2016-03-21 LAB — 25-HYDROXY VITAMIN D LCMS D2+D3
25-Hydroxy, Vitamin D-2: 1 ng/mL
25-Hydroxy, Vitamin D-3: 34 ng/mL

## 2016-03-21 NOTE — [place] Notes (Signed)
   THERAPIST PROGRESS NOTE  Session Time: 11:00 - 11:55  Participation Level: Active  Behavioral Response: CasualAlertDepressed  Type of Therapy: Individual therapy  Treatment Goals addressed: Improve psychiatric symptoms, elevate mood ( increased activity, improved sleep), Improve unhelpful thought patterns, Mood stabilization (improved mood,  decreased depression), , learn about diagnosis, healthy coping skills  Interventions: motivational interviewing, cbt,  psychoeducation  Summary: Mark [name] is a 51 y.o. 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 Mark [name] discussed his psychiatric symptoms, his current life events and his homework. Mark [name]  shared that he has been having a really difficult time due to depression and pain. He shared he had completed his homework packet on bipolar. Client and clinician reviewed and discussed his homework and his symptoms. He stated that he has not had mania in such a long time that he would like to focus on the depression. Clinician gave him a packet on depression which he agreed to complete before next session. Clinician asked open ended questions and Mark [name] shared that he felt purposeless. He explained his daily routine. Clinician shared that his routine may be contributing to his depression. Clinician explained depression and how some of his behaviors might be increasing it (such as watching tv all day). Clinician asked open ended questions and Mark [name] was able to identify behaviors he had when he felt he had a purpose. Client and clinician discussed how he could start to incorporate some of those behaviors into his day. Such as having a set sleeping schedule, engaging in activities (other than tv) Client and clinician discussed sleep hygiene.  [name] mood improved after identifying changes he would be  willing to try. He then shared about looking forward to his granddaughter being born and how he might be able to help his daughter with her care. He shared he loves babies. His mood visibly increased by end of session. He shared he had been practicinghis grounding and mindfulness techniques and would continue to do so   Plan: Return in 1-2 weeks.  Diagnosis: Axis I: Bipolar I disorder, Alcohol use disorder, severe in early remission    Scottie Metayer A, LCSW 03/21/2016

## 2016-03-21 NOTE — Telephone Encounter (Signed)
Patient was here to see Tharon Aquas and I was brought in for a medication question. Patient states that he has not tried the Lexapro that you started him on. Patient states he does not want to spend the money on something that will make him feel funny and that he will not take. He said he is frustrated and depressed. I did note that he frequently starts a medication and then stops within a week or so due to "feeling funny". I am not sure exactly what he wants as far as medications, but I told him I would talk with you and get back to him. Please advise, thank you

## 2016-03-24 LAB — COMPLIANCE DRUG ANALYSIS, UR

## 2016-03-24 NOTE — Telephone Encounter (Signed)
Although I would still encourage him to start medication, he can wait to discuss about it until the next appointment this month. Would appreciate if you could review safety plan (advise him to go to ED/crisis call if ever having active SI.)

## 2016-03-25 NOTE — Telephone Encounter (Signed)
I called patient back and discussed with him what you said, he states he will wait until he sees you.

## 2016-04-01 MED FILL — GABAPENTIN 400 MG CAPSULE: 400 | 30 days supply | Qty: 180 | Fill #1

## 2016-04-03 ENCOUNTER — Other Ambulatory Visit: Payer: Self-pay | Admitting: Family Medicine

## 2016-04-03 DIAGNOSIS — R062 Wheezing: Secondary | ICD-10-CM

## 2016-04-03 MED FILL — METOPROLOL TARTRATE 50 MG T: 50 | 30 days supply | Qty: 60 | Fill #1

## 2016-04-03 MED FILL — ACETAMINOPHEN/COD #3 TABLET: 300-30 | 20 days supply | Qty: 240 | Fill #2

## 2016-04-03 MED FILL — ?CYCLOBENZAPRINE 10 MG TABL: 10 | 30 days supply | Qty: 30 | Fill #3

## 2016-04-03 NOTE — Progress Notes (Signed)
[facility] [facility] MD/PA/NP OP Progress Note  04/10/2016 1:25 PM Mark [name]  MRN:  [id number]  Chief Complaint:  Chief Complaint    Depression; Follow-up     Subjective:  "I'm doing better"  HPI:  - Patient has not started Lexapro with concern for side effect.  Patient presents for follow-up appointment. He states that he is not depressed as before. He states that he has grandchild coming in a few months and he not has things to look for. He was able to get into pain clinic after two years waiting list; he feels very excited about this. He believes that his depression is "situational" duet o pain and unemployment. He feels hesitant to start any medication as he is concerned about side effect and cost with limited benefit. He endorses insomnia. He has not used alcohol over the past six months and denies any craving for it. He states that he was using alcohol to cope with his pain, although he now tries to find other resources to deal with his pain. He denies drug use.    Visit Diagnosis:    ICD-9-CM ICD-10-CM   1. Major depressive disorder, recurrent episode, moderate ([name]) 296.32 F33.1   2. GAD (generalized anxiety disorder) 300.02 F41.1   3. Alcohol use disorder, severe, in early remission ([place]) 305.03 F10.21   4. Bipolar depression ([place]) 296.50 F31.30 traZODone (DESYREL) 50 MG tablet    Past Psychiatric History:  Outpatient: Diagnosed bipolar disorder in 2013 when he answered questionnaire. He reports history of "manic" of being impulsive,  buying things he could not afford. It lasted for a couple of days. It occurred last in late 20's. He denies any decreased need for sleep in the past.  Psychiatry admission: Per chart review, history of admission in 2015 for alcohol use.   Previous suicide attempt:  trying to cut his throat in 2014, when his step father intervened.  Past trials of medication: Remeron,  Effexor, Prozac, Paxil, nortriptyline, duloxetine, trazodone, Abilify, olanzapine,  History of  violence: denies   Past Medical History:  Past Medical History:  Diagnosis Date  . Alcohol withdrawal ([place])   . Allergy   . Anxiety Dx 2014  . COPD (chronic obstructive pulmonary disease) ([place])   . Drug abuse    pt reports opioid dependence due to previous back surgeries  . Enlarged prostate   . ETOH abuse   . GERD (gastroesophageal reflux disease) Dx 2003  . Headache(784.0)   . Hepatitis   . Hyperlipidemia Dx 2000  . Hypertension Dx 2011  . IBS (irritable bowel syndrome)    alternates constiaption/ diarrhea  . Irregular heart beat   . 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 Mental disorder   . Neuromuscular disorder ([place])    hands hurt   . Neuropathy ([place]) 06/07/2013  . Seizure due to alcohol withdrawal ([place]) 01/23/2014   Per patient report  . Shortness of breath   . Withdrawal seizures ([place]) 2014   last seizure 2014 etoh with drawal    Past Surgical History:  Procedure Laterality Date  . [place] STUDY N/A 01/16/2015   Procedure: [place] STUDY;  Surgeon: Manus Gunning, MD;  Location: [facility];  Service: Gastroenterology;  Laterality: N/A;  . [place]  . COLONOSCOPY  1998  . LEFT HEART CATHETERIZATION WITH CORONARY ANGIOGRAM N/A 01/13/2014   Procedure: LEFT HEART CATHETERIZATION WITH CORONARY ANGIOGRAM;  Surgeon: Clent Demark, MD;  Location: [facility] CATH LAB;  Service: Cardiovascular;  Laterality: N/A;  . NASAL SINUS SURGERY    . NECK SURGERY  2000   cervial fusion   . NISSEN FUNDOPLICATION    . SUBACROMIAL DECOMPRESSION Right 11/16/2014   Procedure: RIGHT SHOULDER ARTHROSCOPY WITH SUBACROMIAL DECOMPRESSION ;  Surgeon: Leandrew Koyanagi, MD;  Location: [facility];  Service: Orthopedics;  Laterality: Right;  . UPPER GASTROINTESTINAL ENDOSCOPY      Family Psychiatric History:  Patient reported father and grandfather has history of heavy drinking.  He also mentioned one of his uncle has schizophrenia. He has a daughter with schizophrenia,  bipolar disorder  Family History:  Family History  Problem Relation Age of Onset  . Lung cancer Father     was a smoker  . Alcohol abuse Father   . Hypertension Mother   . Colon polyps Mother   . Breast cancer Paternal Grandmother   . Alcohol abuse Paternal Grandfather   . Colon cancer Neg Hx   . Rectal cancer Neg Hx   . Stomach cancer Neg Hx     Social History:  Social History   Social History  . Marital status: Divorced    Spouse name: N/A  . Number of children: 2  . Years of education: N/A   Occupational History  . [employer]     Social History Main Topics  . Smoking status: Current Every Day Smoker    Packs/day: 0.50    Years: 30.00    Types: Cigarettes  . Smokeless tobacco: Never Used     Comment: 11-30-2014 info given, Has cut back from 2 pks to 1/2 pack and "working on it"  . Alcohol use No     Comment: occ  . Drug use: No     Comment: Opana  . Sexual activity: Not Currently   Other Topics Concern  . None   Social History Narrative  . None    Allergies:  Allergies  Allergen Reactions  . Cozaar [Losartan] Anaphylaxis  . Lisinopril Anaphylaxis    Metabolic Disorder Labs: Lab Results  Component Value Date   HGBA1C 6.0 04/03/2015   MPG 128 (H) 01/22/2014   No results found for: PROLACTIN No results found for: CHOL, TRIG, HDL, CHOLHDL, VLDL, LDLCALC   Current Medications: Current Outpatient Prescriptions  Medication Sig Dispense Refill  . acetaminophen-codeine (TYLENOL #3) 300-30 MG tablet Take 1-2 tablets by mouth every 4 (four) hours as needed for moderate pain. 240 tablet 2  . cyclobenzaprine (FLEXERIL) 10 MG tablet Take 1 tablet (10 mg total) by mouth at bedtime as needed for muscle spasms. 60 tablet 5  . dicyclomine (BENTYL) 20 MG tablet Take 1 tablet (20 mg total) by mouth 3 (three) times daily as needed for spasms. 90 tablet 1  . gabapentin (NEURONTIN) 400 MG capsule Take 2 capsules (800 mg total) by mouth 3 (three) times daily. 180 capsule  2  . metoprolol (LOPRESSOR) 50 MG tablet Take 1 tablet (50 mg total) by mouth 2 (two) times daily. 60 tablet 11  . traMADol (ULTRAM) 50 MG tablet Take 1 tablet (50 mg total) by mouth 2 (two) times daily as needed for moderate pain. 60 tablet 2  . traZODone (DESYREL) 50 MG tablet Take 1 tablet (50 mg total) by mouth at bedtime as needed for sleep. 30 tablet 1  . VENTOLIN HFA 108 (90 Base) MCG/ACT inhaler INHALE 2 PUFFS INTO THE LUNGS EVERY 4 HOURS AS NEEDED FOR WHEEZING OR SHORTNESS OF BREATH. 18 g 0  . tadalafil (CIALIS) 20 MG  tablet Take 0.5-1 tablets (10-20 mg total) by mouth every other day as needed for erectile dysfunction. (Patient not taking: Reported on 04/10/2016) 30 tablet 3   No current facility-administered medications for this visit.     Neurologic: Headache: No Seizure: No Paresthesias: Yes  Musculoskeletal: Strength & Muscle Tone: within normal limits Gait & Station: normal Patient leans: N/A  Psychiatric Specialty Exam: Review of Systems  Musculoskeletal: Positive for back pain.  Neurological: Positive for tingling.  Psychiatric/Behavioral: Positive for depression. Negative for hallucinations, substance abuse and suicidal ideas. The patient is nervous/anxious. The patient does not have insomnia.   All other systems reviewed and are negative.   Blood pressure 138/88, pulse 67, height 6\' 4"  (1.93 m), weight 223 lb (101.2 kg).Body mass index is 27.14 kg/m.  General Appearance: Casual  Eye Contact:  Good  Speech:  Clear and Coherent  Volume:  Normal  Mood:  "not as bad"  Affect:  Appropriate and Congruent  Thought Process:  Coherent and Goal Directed  Orientation:  Full (Time, Place, and Person)  Thought Content: Logical Perceptions: denies AH/VH  Suicidal Thoughts:  No  Homicidal Thoughts:  No  Memory:  Immediate;   Good Recent;   Good Remote;   Good  Judgement:  Good  Insight:  Fair  Psychomotor Activity:  Normal  Concentration:  Concentration: Good and  Attention Span: Good  Recall:  Good  Fund of Knowledge: Good  Language: Good  Akathisia:  No  Handed:  Right  AIMS (if indicated):  N/A  Assets:  Communication Skills Desire for Improvement  ADL's:  Intact  Cognition: WNL  Sleep:  fair   Assessment: Mark [name] is a 51 year old male with bipolar disorder, alcohol use disorder in early remission, anxiety,  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 Patient was admitted 8/29-9/1 for chest pain, suicidally and received care for alcohol withdrawal in medicine unit.   # GAD # MDD with mixed features # r/p Bipolar II disorder Today's exam is notable for less rumination on his pain and there has been improvement in his neurovegetative symptoms. Psychosocial factors which includes expected birth of his grandchild and the fact of getting enrolled in pain clinic play significant role in improvement in his symptoms. After having discussion, will not start antidepressant at this time and will re-evaluate in a month.   # Alcohol use disorder in early remission Patient is motivated for sobriety and denies any alcohol use since the last admission in September. Patient will greatly benefit from pain specialist care which would also help him keep his sobriety given his trigger for alcohol use was uncontrollable pain. Patient has not demonstrated any concerning behaviors for drug seeking since he has been under the care of this writer in September 2017.   Plans 1. Continue Trazodone 50 mg at night as needed for sleep 2. Return to clinic in one month  The patient demonstrates the following  risk factors for suicide: Chronic risk factors for suicide include psychiatric disorder; bipolar disorder, anxiety, substance use disorder, medical illness of chronic pain, previous SA, demographic factors (male). Acute risk factors for suicide include medical problems.  Protective factors for this patient  include positive social support, coping skills, hope for the future, religious beliefs against suicide.  Considering these factors, the overall suicide risk at this point appears to be moderately elevated but not at imminent danger to self. Discussed emergency resources which includes 911, crisis  line and coming to ED.    Treatment Plan Summary:Plan as above   Norman Clay, MD 04/10/2016, 1:25 PM

## 2016-04-05 ENCOUNTER — Other Ambulatory Visit: Payer: Self-pay | Admitting: Family Medicine

## 2016-04-05 DIAGNOSIS — R062 Wheezing: Secondary | ICD-10-CM

## 2016-04-05 MED FILL — VENTOLIN HFA 90 MCG INHALER: 108 (90 BAS | 16 days supply | Qty: 18 | Fill #0

## 2016-04-05 MED FILL — traMADol HCL 50 MG TABS: 50 | 30 days supply | Qty: 60 | Fill #2

## 2016-04-09 ENCOUNTER — Ambulatory Visit (INDEPENDENT_AMBULATORY_CARE_PROVIDER_SITE_OTHER): Payer: Self-pay | Admitting: Clinical

## 2016-04-09 ENCOUNTER — Encounter (HOSPITAL_COMMUNITY): Payer: Self-pay | Admitting: Clinical

## 2016-04-09 DIAGNOSIS — F319 Bipolar disorder, unspecified: Secondary | ICD-10-CM

## 2016-04-09 DIAGNOSIS — F1021 Alcohol dependence, in remission: Secondary | ICD-10-CM

## 2016-04-09 NOTE — Progress Notes (Signed)
   THERAPIST PROGRESS NOTE  Session Time: 12:00 - 12:55  Participation Level: Active  Behavioral Response: CasualAlertDepressed  Type of Therapy: Individual therapy  Treatment Goals addressed: Improve psychiatric symptoms, elevate mood (increased energy, increased activity), Improve unhelpful thought patterns,  Mood stabilization (improved mood, decreased anxiety, decreased depression), healthy coping skills  Interventions: motivational interviewing, cbt,  mindfulness techniques, psychoeducation  Summary: Mark [name] is a 51 y.o. male who presents with Bipolar I disorder, Alcohol use disorder, severe in early remission  Suicidal/Homicidal: No without intent/plan  Therapist Response: [name] met with clinician for an individual session. [name] discussed his psychiatric symptoms, his current life events, and his homework. Mark [name]  shared that he continues to feel about the same - depressed and in pain. He stated he has not drank any alcohol and is committed to continued abstinence.  He shared he forgot to bring his homework packet but would complete it and bring it back next session. He shared he has been practicing his grounding and mindfulness techniques, but not as often as suggested. He shared he sent about 9 out of the last 14 days at his ex wife's house. He had went there before the snow storm and stayed through and then returned for another occasion. His exwife and him are good friends now. He is glad to also to have the opportunity to spend time with his children. Client and clinician discussed the difference between depression and pain when he is alone in his house and when he is interacting with others. Mark [name] shared that the pain and depression continue but that his focus is not on them so the intensity is lessened. Client and clinician discussed how he could use this knowledge to improve his mood and outlook. Last session we had discussed some possible opportunities for interaction that do not  cost money. Now that the weather and roads have cleared up he agreed to make an effort to get out of the house more. Client and clinician practiced a mindfulness technique together.   Plan: Return in 2-4 weeks.  Diagnosis: Axis I: Bipolar I disorder, Alcohol use disorder, severe in early remission    Ousman Dise A, LCSW 04/09/2016

## 2016-04-10 ENCOUNTER — Encounter (HOSPITAL_COMMUNITY): Payer: Self-pay | Admitting: Psychiatry

## 2016-04-10 ENCOUNTER — Ambulatory Visit (INDEPENDENT_AMBULATORY_CARE_PROVIDER_SITE_OTHER): Payer: No Typology Code available for payment source | Admitting: Psychiatry

## 2016-04-10 VITALS — BP 138/88 | HR 67 | Ht 76.0 in | Wt 223.0 lb

## 2016-04-10 DIAGNOSIS — Z8249 Family history of ischemic heart disease and other diseases of the circulatory system: Secondary | ICD-10-CM

## 2016-04-10 DIAGNOSIS — Z79899 Other long term (current) drug therapy: Secondary | ICD-10-CM

## 2016-04-10 DIAGNOSIS — F319 Bipolar disorder, unspecified: Secondary | ICD-10-CM

## 2016-04-10 DIAGNOSIS — F411 Generalized anxiety disorder: Secondary | ICD-10-CM

## 2016-04-10 DIAGNOSIS — F331 Major depressive disorder, recurrent, moderate: Secondary | ICD-10-CM

## 2016-04-10 DIAGNOSIS — Z801 Family history of malignant neoplasm of trachea, bronchus and lung: Secondary | ICD-10-CM

## 2016-04-10 DIAGNOSIS — Z9889 Other specified postprocedural states: Secondary | ICD-10-CM

## 2016-04-10 DIAGNOSIS — Z8371 Family history of colonic polyps: Secondary | ICD-10-CM

## 2016-04-10 DIAGNOSIS — Z888 Allergy status to other drugs, medicaments and biological substances status: Secondary | ICD-10-CM

## 2016-04-10 DIAGNOSIS — F17210 Nicotine dependence, cigarettes, uncomplicated: Secondary | ICD-10-CM

## 2016-04-10 DIAGNOSIS — Z811 Family history of alcohol abuse and dependence: Secondary | ICD-10-CM

## 2016-04-10 DIAGNOSIS — F1021 Alcohol dependence, in remission: Secondary | ICD-10-CM

## 2016-04-10 DIAGNOSIS — Z803 Family history of malignant neoplasm of breast: Secondary | ICD-10-CM

## 2016-04-10 MED ORDER — TRAZODONE HCL 50 MG PO TABS: 50.0000 mg | ORAL_TABLET | Freq: Every evening | ORAL | 1 refills | Status: DC | PRN

## 2016-04-10 MED FILL — traZODone HCL 50 MG TABS: 50 | 30 days supply | Qty: 30 | Fill #0

## 2016-04-10 NOTE — Patient Instructions (Addendum)
1. Continue Trazodone 50 mg at night as needed for sleep 2. Return to clinic in one month

## 2016-04-12 MED FILL — !CIALIS 20 MG TABLET: 20 | 30 days supply | Qty: 5 | Fill #0

## 2016-04-15 DIAGNOSIS — G8929 Other chronic pain: Secondary | ICD-10-CM | POA: Insufficient documentation

## 2016-04-15 DIAGNOSIS — F11981 Opioid use, unspecified with opioid-induced sexual dysfunction: Secondary | ICD-10-CM | POA: Insufficient documentation

## 2016-04-15 DIAGNOSIS — M7918 Myalgia, other site: Secondary | ICD-10-CM

## 2016-04-15 HISTORY — DX: Other chronic pain: G89.29

## 2016-04-15 NOTE — Progress Notes (Signed)
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 INJECTED INTO THE [facility].  ONE IMAGE OF THE  LUMBAR SPINE WAS OBTAINED AND SHOWS NORMAL L4, L5, AND S1 NERVE ROOTS BILATERALLY BUT A FULL LUMBAR MYELOGRAM WAS NOT PERFORMED.  CERVICAL MYELOGRAM WAS PERFORMED AFTER THE PATIENT WAS PLACED IN A REVERSED TRENDELENBURG  POSITION AND CONTRAST WAS FOLLOWED UP INTO THE CERVICAL SPINE AREA.   MULTIPLE FLUOROSCOPIC IMAGES DEMONSTRATE NORMAL APPEARING EXITING NERVE ROOTS WITHOUT EVIDENCE FOR EFFACEMENT.  THE LATERAL IMAGES SHOW NO DEFINITE ANTERIOR EXTRA DURAL DEFECTS.  THE PATIENT THEN HAD A CERVICAL CT EXAMINATION.  I BROUGHT THE PATIENT BACK TO THE FLUOROSCOPY SUITE BUT CONTRAST WAS TOO DILUTED AND NO IMAGES WERE OBTAINED.  THE PATIENT WAS THEN SENT BACK TO CT FOR THE LUMBAR CT EXAMINATION. IMPRESSION   Shoulder Imaging: Shoulder-R MR w contrast:  Results for orders placed during the hospital encounter of 08/26/14  MR Shoulder Right W Contrast   Narrative CLINICAL DATA:  Fall on the RIGHT shoulder 6 months ago. Numbness down the RIGHT arm. Limited range of motion. One previous  cortisone injection.  EXAM: MR ARTHROGRAM OF THE RIGHT SHOULDER  TECHNIQUE: Multiplanar, multisequence MR imaging of the RIGHT shoulder was performed following the administration of intra-articular contrast.  CONTRAST:  See Injection Documentation.  COMPARISON:  None.  FINDINGS: Rotator cuff: Extravasation of contrast from the glenohumeral joint into the subacromial/subdeltoid bursa. At the insertion of SUPRASPINATUS, there is a bursal surface tear that extend nearly full-thickness. A small full-thickness perforation extending to the articular surface is likely present accounting for the contrast in the subacromial/subdeltoid bursa. The width of the tear measures 18 mm on sagittal imaging. Maximal retraction is 5 mm. Fraying of the undersurface of SUPRASPINATUS.  The INFRASPINATUS tendon and teres minor tendons are intact. INFRASPINATUS tendinopathy. Longitudinal split tear of the sub scapularis tendon is present that communicates with the bicipital groove (image 13 series 2).  Muscles: Normal.  Biceps long head: Normal.  Acromioclavicular Joint: Type 2 acromion. Mild AC joint osteoarthritis.  Glenohumeral Joint: Mild synovitis in the rotator interval. Glenohumeral ligaments are intact.  Labrum: Abduction external rotation view was performed but is significantly degraded by motion artifact. On the other image sets, the glenoid labrum appears intact.  Bones: No significant extra-articular findings.  IMPRESSION: 1. SUPRASPINATUS tendon bursal surface partial width tendon tear with a small full-thickness perforation extending to the articular surface accounting extravasation of contrast into the subacromial/subdeltoid bursa. 2. Longitudinal split tear of the distal SUBSCAPULARIS tendon. 3. Intact glenoid labrum and glenohumeral ligaments.   Electronically Signed   By: Dereck Ligas M.D.   On: 08/26/2014 16:19    Shoulder-R MR wo contrast:  Results for orders  placed during the hospital encounter of 01/23/15  MR Shoulder Right Wo Contrast   Narrative CLINICAL DATA:  Shoulder pain after falling last month. History of right shoulder surgery 2 months ago. Subsequent encounter.  EXAM: MRI OF THE RIGHT SHOULDER WITHOUT CONTRAST  TECHNIQUE: Multiplanar, multisequence MR imaging of the shoulder was performed. No intravenous contrast was administered.  COMPARISON:  MRI 08/26/2014 and radiographs 06/02/2014.  FINDINGS: The study is mildly motion degraded.  Rotator cuff: There is a new resorbable anchor in the greater tuberosity consistent with interval rotator cuff repair. There is a persistent or recurrent high-grade partial articular surface insertional tear of the supraspinatus tendon, best seen on the coronal and sagittal images. The bursal surface fibers appear intact, and there is no significant tendon retraction. There is persistent articular surface attenuation of the distal subscapularis tendon without full-thickness tear. The infraspinatus and teres minor tendons appear normal.  Muscles:  No focal muscular atrophy or edema.  Biceps long head: Interval biceps tenodesis with a new tenodesis screw anterior medially in the proximal humeral diaphysis. There is mild surrounding bone marrow edema. The biceps tendon is not well visualized distal to the screw.  Acromioclavicular Joint: The acromion is type 2. Probable interval subacromial decompression. There is a moderate amount of fluid in the subacromial -subdeltoid space.  Glenohumeral Joint: Mild glenohumeral degenerative changes. No significant shoulder joint effusion.  Labrum: Probable interval superior labral debridement. No recurrent labral tear or paralabral cyst identified.  Bones: No acute extra-articular osseous findings. Postsurgical changes as described.  IMPRESSION: 1. Interval postsurgical changes consistent with rotator cuff repair, subacromial decompression and  bicipital tenodesis. 2. Suspected recurrent partial articular surface insertional tear of the supraspinatus tendon. No definite full-thickness tendon tear or tendon retraction identified. There is moderate fluid in the subacromial-subdeltoid space.   Electronically Signed   By: Richardean Sale M.D.   On: 01/23/2015 15:55    Shoulder-R DG:  Results for orders placed during the hospital encounter of 03/18/16  DG Shoulder Right   Narrative CLINICAL DATA:  Injury.  Pain.  EXAM: RIGHT SHOULDER - 2+ VIEW  COMPARISON:  11/07/2015.  01/23/2015.  MRI 08/26/2014 .  FINDINGS: Acromioclavicular and glenohumeral degenerative change. Corticated lucency is again in the proximal right humerus again noted consistent postsurgical change . Exam is stable from prior MRI 08/26/2014 . No acute bony abnormality identified.  IMPRESSION: Acromioclavicular and glenohumeral degenerative change with postsurgical changes proximal humerus. No acute abnormality identified.   Electronically Signed   By: Marcello Moores  Register   On: 03/18/2016 15:11    Lumbosacral Imaging: Lumbar MR wo contrast:  Results for orders placed during the hospital encounter of 04/06/11  MR Lumbar Spine Wo Contrast   Narrative *RADIOLOGY REPORT*  Clinical Data: Back pain after trauma on the 02/08/2011.  Right leg parasthesias.  The prior lumbar surgery 1999.  MRI LUMBAR SPINE WITHOUT CONTRAST  Technique:  Multiplanar and multiecho pulse sequences of the lumbar spine were obtained without intravenous contrast.  Comparison: Report from prior lumbar MRI from 02/21/2001 (images are not available).  Findings: As on the prior exam, there is a mildly transitional lumbosacral vertebra which is considered to be S1.  The Ray cages and posterior decompression are at the L5-S1 level.  The conus medullaris appears unremarkable.  Conus level:  L1-2.  No significant vertebral subluxation.  Inversion recovery weighted images  demonstrate no significant abnormal vertebral or periligamentous edema.  Despite efforts by the patient and technologist, motion artifact is present on some series of today's examination and could not be totally eliminated.  This reduces diagnostic sensitivity and specificity.  Intermediate signal intensity in the right sacral ala on the bottom most image (image 37, series 7) is nonspecific and probably incidental. Paravertebral musculature appears symmetric and unremarkable.  Additional findings at individual levels are as follows:  L2-3:  Mild facet arthropathy noted along with a minimal disc bulge.  No impingement.  L3-4:  Unremarkable.  L4-5:  Bilateral facet arthropathy noted with mild disc osteophyte complex.  There is borderline right subarticular lateral recess stenosis.  L5-S1:  Posterior decompression is observed with Ray cages at the intervertebral level. Edema signal tracts along the laminectomy/facetectomy defects, left greater than right, and likely a chronic postoperative finding.  No impingement observed.  S1-2:  Disc space at this level is rudimentary.  No impingement is identified.  IMPRESSION:  1.  Postoperative findings at L5-S1. 2.  Mild degenerative disc disease and mild spondylosis, without observed impingement.  Original Report Authenticated By: Carron Curie, M.D.   Lumbar MR w/wo contrast:  Results for orders placed in visit on 02/21/01  MR Lumbar Spine W Wo Contrast   Narrative FINDINGS CLINICAL DATA:  CAGE FUSION L5-S1.  LOW BACK PAIN EXTENDING INTO BOTH LEGS. MRI OF THE LUMBAR SPINE WITH CONTRAST MULTIPLANAR T1- AND T2-WEIGHTED IMAGING WAS PERFORMED INCLUDING IMAGING AFTER INTRAVENOUS ADMINISTRATION OF 20 CC OF OMNISCAN.  COMPARISON IS MADE TO THE MOST RECENT EXAM OF 12/25/00. AGAIN NOTED IS NO SIGNIFICANT FINDING AT L4-5 OR ABOVE.  THE INTERVERTEBRAL DISCS ARE WITHIN NORMAL LIMITS IN SIGNAL CHARACTERISTICS AND MORPHOLOGY.  THE  SPINAL CANAL AND NEURAL FORAMINA ARE WIDELY PATENT.  THE DISTAL CORD AND CONUS ARE NORMAL. AT L5-S1, THE PATIENT HAS PAIRED INTERBODY CAGES WHICH APPEAR GROSSLY WELL POSITIONED.  THERE ARE CHRONIC DISCOGENIC CHANGES AFFECTING THE L5 AND S1 VERTEBRAL BODIES WITH SOME FATTY CHANGE, SCLEROSIS AND LOW LEVEL ENHANCEMENT.  THE APPEARANCE IS ESSENTIALLY THE SAME AS IT WAS TWO MONTHS AGO.  THERE IS AN EXPECTED DEGREE OF EPIDURAL FIBROSIS.  NO APPARENT COMPROMISE OF THE CANAL OR NEURAL FORAMINA. THE S1-2 DISC IS RUDIMENTARY AND UNREMARKABLE. IMPRESSION 1.  NO CHANGE SINCE TWO MONTHS AGO.  INTERBODY CAGES ARE IN PLACE AT L5-S1.  THE APPEARANCE IS GROSSLY SATISFACTORY WITH SOME EXPECTED REACTIVE CHANGES OF THE END PLATES BUT NO APPARENT COMPLICATION OR ANY STENOSIS OR COMPRESSIVE LESION.   Lumbar CT wo contrast:  Results for orders placed in visit on 01/11/99  CT Lumbar Spine Wo Contrast   Narrative FINDINGS CLINICAL DATA:  BACK AND LEG PAIN, RIGHT WORSE THAN LEFT. ABNORMAL DISC AT L5-S1. MILDLY ABNORMAL DISK AT L4-5. DIAGNOSTIC LUMBAR DISC INJECTION L4-5: AN OBLIQUE APPROACH WAS TAKEN USING A CURVED 22 GAUGE CHIBA NEEDLE. THE OVERLYING SKIN WAS SCRUBBED WITH BETADINE AND STERILELY DRAPED. SKIN ANESTHESIA WAS CARRIED OUT USING 1% LIDOCAINE. THE NEEDLE WAS DIRECTED INTO THE NUCLEAR REGION OF THE [place]. CONTRAST MIXED WITH DILUTE ANCEF WAS INJECTED DURING FLUOROSCOPIC OBSERVATION AND PRESSURE MONITORING. OPENING PRESSURE WAS 40 PSI. THE DISC ACCOMMODATED ONLY 2 CC OF CONTRAST WITH ELEVATION OF INTRADISCAL PRESSURE TO 100 PSI. THERE IS A NORMAL NUCLEAR PATTERN. THE PATIENT FELT SOME MILD BACK PRESSURE BUT DID NOT FEEL HIS FAMILIAR PAIN. DIAGNOSTIC DISC INJECTION L5-S1: AN OBLIQUE APPROACH WAS TAKEN USING A CURVED 22 GAUGE CHIBA NEEDLE. THIS WAS POSITIONED IN THE NUCLEUS OF THE [place]. CONTRAST MIXED WITH DILUTE ANCEF WAS INJECTED DURING DIRECT OBSERVATION AND PRESSURE MONITORING. OPENING PRESSURE WAS  10 PSI. THE DISC ACCOMMODATED 3 CC OF CONTRAST WITH ELEVATION OF INTRADISCAL PRESSURE ONLY TO 40 PSI. THE PATIENT REPORTED REPRODUCTION OF HIS FAMILIAR HOME PAIN AFFECTING HIS LOW BACK.  THERE WAS SOME RADICULAR SYMPTOMATOLOGY TOWARDS THE LEFT. IMAGING SHOWS AN ABNORMAL PATTERN OF CONTRAST EXTENSION WITH THE CONTRAST PERVADING THE NUCLEAR REGION AND EXTENDING INTO ANNULAR REGIONS, PARTICULARLY IN THE LEFT POSTEROLATERAL DIRECTION. IMPRESSION   Lumbar CT w contrast:  Results for orders placed in visit on 04/03/01  CT Lumbar Spine W Contrast   Narrative FINDINGS CLINICAL DATA:  LOW BACK PAIN.  PREVIOUS LUMBAR FUSION L5-S1. LUMBAR DISKOGRAM RAY CAGE INJECTION CT LUMBAR SPINE WITH INTRADISKAL CONTRAST TECHNIQUE: LUMBAR REGION PREPPED WITH BETADINE AND DRAPED USING STRICT ASEPTIC TECHNIQUE.  INTRAVENOUS VERSED WAS ADMINISTERED AS ANXIOLYTIC.   AFTER THE SKIN WAS INFILTRATED WITH BUFFERED LIDOCAINE, A CURVED 15 CM 22 GAUGE CHIBA NEEDLE WAS ADVANCED INTO THE L4-5 INTERSPACE FROM A LEFT POSTEROLATERAL  APPROACH.  ONCE NEEDLE TIP POSITION WAS CONFIRMED ON AP AND LATERAL FLUOROSCOPY, THE INTERSPACE WAS INJECTED WITH WATER SOLUBLE CONTRAST DURING CONTINUOUS FLUOROSCOPIC AND PRESSURE MONITORING.  THE NEEDLE WAS THEN REMOVED.   INTRAVENOUS FENTANYL AND VERSED WERE ADMINISTERED FOR CONSCIOUS SEDATION DURING CONTINUOUS RESPIRATORY MONITORING BY RADIOLOGY R.N.    THE SKIN WAS FURTHER ANESTHETIZED WITH BUFFERED LIDOCAINE AND 20 CM, 22 GAUGE CHIBA NEEDLE ADVANCED TO THE MARGIN OF THE LEFT RAY CAGE AT THE L5-S1 INTERSPACE USING A LEFT POSTEROLATERAL APPROACH.  UNDER FLUOROSCOPY, THE NEEDLE WAS INJECTED.   IN A SIMILAR FASHION, A SECOND 20 CM 22 GAUGE CHIBA NEEDLE WAS ADVANCED TO THE MARGIN OF THE RIGHT CAGE AT L5-S1 AND THE NEEDLE INJECTED UNDER FLUOROSCOPY.   THE PATIENT WAS THEN TRANSFERRED TO CT FOR AXIAL SCANNING THROUGH THESE LEVELS.   NO IMMEDIATE COMPLICATION.   THE PATIENT WAS COOPERATIVE THROUGHOUT  THE EXAMINATION, ALTHOUGH MODERATELY HYPERESTHETIC, AND SEEMED RELIABLE. FINDINGS: L4-5:  OPENING PRESSURE 25 PSI.   PRESSURE AT PAIN RESPONSE 50 PSI AFTER 1 ML OF CONTRAST.   THE PATIENT DESCRIBES PAIN AND PRESSURE IN HIS LOW BACK AND BUTTOCK AREA WITHOUT RADICULAR COMPONENT RATED 5 ON A PAIN SCALE OF 1 TO 10.  DISKOGRAPHY DEMONSTRATES NORMAL NUCLEAR SPREAD OF CONTRAST. CT DISKOGRAPHY DEMONSTRATES NORMAL NUCLEAR SPREAD OF CONTRAST WITHOUT ANY ANNULAR DEFECT.  THERE IS VERY MILD POSTERIOR DISK BULGE NOTED AS WELL AS EARLY BILATERAL FACET HYPERTROPHY. L5-S1:  INJECTION OF THE RAY CAGE ON THE LEFT IS NORMAL WITH NO CONTRAST FLOWING CENTRALLY AND A SMALL AMOUNT OF CONTRAST EXTRAVASATION PERIPHERALLY, OBTAINING INJECTION PRESSURES UP TO 100 PSI. INJECTION ON THE RIGHT DID RESULT IN CONTRAST FLOWING WITHIN THE INTERSTICES OF THE RAY CAGE AND ALSO MORE CENTRALLY WITHIN THE INTERSPACE.  THIS WAS CONFIRMED ON LATERAL IMAGING TO REPRESENT CONTRAST WITHIN THE INTERSPACE AND NOT FLOWING IN THE EPIDURAL SPACE ANTERIOR OR POSTERIOR TO THE DISK.   CT THROUGH THIS LEVEL DEMONSTRATES THE TWO RAY CAGES PROJECTING IN EXPECTED LOCATION WITH THE LEFT SIDED CAGE PROJECTING JUST ANTERIOR TO THE ANTERIOR MARGIN OF THE SACRAL CORTEX.  THERE IS NO EVIDENCE OF LUCENCY AROUND THE HARDWARE. IMPRESSION 1)     MORPHOLOGY NORMAL DISKOGRAPHY AT L4-5 ALTHOUGH THERE WAS A CONCORDANT PAIN RESPONSE AS DETAILED ABOVE. 2)    ABNORMAL RAY CAGE INJECTION AT L5-S1 MANIFEST BY CONTRAST ENTERING THE INTERSTICES OF THE RIGHT SIDED STENT AND ENTERING THE INTERSPACE FROM THE RIGHT SIDED INJECTION.   Lumbar DG (Complete) 4+V:  Results for orders placed in visit on 12/25/00  DG Lumbar Spine Complete   Narrative FINDINGS CLINICAL DATA:  CERVICAL SPONDYLOSIS. LOW BACK PAIN AND BILATERAL LEG PAIN.  CHRONIC HEADACHES. CERVICAL SPINE THREE VIEWS LATERAL FILMS WERE OBTAINED IN THE NEUTRAL, FLEXION AND EXTENSION POSITIONS.  THERE IS AN  ANTERIOR PLATE FUSING C5, C6 AND C7.  INTERBODY BONE PLUGS ARE PRESENT AT C5-6 AND C6-7 WHICH DO NOT YET APPEAR FULLY INCORPORATED.  THE ALIGNMENT IS NORMAL.  THE REMAINDER OF THE DISC SPACES ARE INTACT. THERE IS NO FRACTURE. IMPRESSION ANTERIOR PLATE FUSION C5, C6, AND C7.  BONY FUSION DOES NOT YET APPEAR COMPLETE AS THERE IS A THIN RADIOLUCENCY BETWEEN THE END PLATES AND THE BONE PLUG AT BOTH LEVELS. MRI CERVICAL SPINE WITHOUT AND WITH CONTRAST COMPARISON WITH MRI FROM 05/20/99. THE ALIGNMENT IS NORMAL.  THE CORD HAS NORMAL SIGNAL.  THERE IS AN ANTERIOR PLATE FUSION OF C5, C6 AND C7.  THE ENHANCEMENT PATTERN IS WITHIN NORMAL LIMITS AND THERE IS NO EVIDENCE OF  INFECTION OR TUMOR. C2-3:  NEGATIVE. C3-4:  NEGATIVE. C4-5:  MILD DISC DEGENERATION AND DISC BULGING.  THERE IS MILD FACET ARTHROPATHY WITHOUT SIGNIFICANT SPINAL STENOSIS. C5-6:  THERE IS ARTIFACT FROM THE PRIOR SURGICAL FUSION.  THERE DOES APPEAR TO BE SOME RESIDUAL MILD SPONDYLOSIS.  THE BONE PLUG IS POSITIONED SLIGHTLY POSTERIOR TO THE VERTEBRAL BODY MARGIN. THIS IS CAUSING MILD SPINAL STENOSIS.  MILD FACET ARTHROPATHY. C6-7:  THERE IS MILD SPINAL STENOSIS DUE TO SPONDYLOSIS AND THERE ARE BIFORAMINAL NARROWING.  THERE IS MILD FACET ARTHROPATHY. C7-T1:  SMALL CENTRAL AND LEFT-SIDED DISC HERNIATION IS SEEN WITH SLIGHT FLATTENING OF THE LEFT SIDE OF THE CORD. 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 BROAD BASED SHALLOW CENTRAL AND LEFT-SIDED DISC HERNIATION C7-T1. LUMBAR SPINE FOUR VIEWS THERE ARE FIVE LUMBAR SEGMENTS.  BILATERAL RAY CAGES AT L5-S1 ARE IN GOOD POSITION.  THERE IS MILD DISC SPACE NARROWING AND SPURRING AT L4-5.  THERE IS NO PARS DEFECT OR FRACTURE. 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 SATISFACTORY RAY CAGE FUSION AT L5-S1. MRI LUMBAR SPINE WITHOUT AND WITH CONTRAST THE LUMBAR ALIGNMENT IS NORMAL.  NO FRACTURE IS SEEN.  THERE  IS SOME BONE MARROW ENHANCEMENT AT L5- S1 RELATED TO THE RAY CAGE FUSION.  THE CONUS MEDULLARIS IS INTACT.  THERE IS NO FRACTURE. L1-2:  MILD DISC SPACE NARROWING. L2-3:  MILD DISC SPACE NARROWING. L3-4:  MILD DISC SPACE NARROWING AND MILD FACET ARTHROPATHY WITHOUT SIGNIFICANT SPINAL STENOSIS. L4-5:  MILD DISC SPACE NARROWING AND MILD BULGING.  THERE IS MILD TO MODERATE FACET ARTHROPATHY BUT THERE IS NO SIGNIFICANT SPINAL STENOSIS. L5-S1:  BILATERAL RAY CAGE FUSION HAS BEEN PERFORMED.  S1 NERVE ROOTS ARE NORMALLY POSITIONED. THERE IS NO STENOSIS. 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  THERE IS NO DISC HERNIATION OR SIGNIFICANT SPINAL STENOSIS.   Lumbar DG Bending views:  Results for orders placed during the hospital encounter of 03/18/16  DG Lumbar Spine Complete W/Bend   Narrative CLINICAL DATA:  Pain.  No injury.  EXAM: LUMBAR SPINE - COMPLETE WITH BENDING VIEWS  COMPARISON:  CT 04/12/2014.  FINDINGS: Prior L5-S1 fusion. Anatomic alignment. Hardware intact diffuse degenerative change. No acute bony abnormality . Aortoiliac atherosclerotic vascular calcification.  IMPRESSION: 1. Diffuse multilevel degenerative change. L5-S1 fusion. Anatomic alignment.  2. Aortoiliac atherosclerotic vascular disease.   Electronically Signed   By: Marcello Moores  Register   On: 03/18/2016 15:03    Lumbar DG Diskogram views:  Results for orders placed in visit on 04/03/01  DG Diskogram Lumbar   Narrative FINDINGS CLINICAL DATA:  LOW BACK PAIN.  PREVIOUS LUMBAR FUSION L5-S1. LUMBAR DISKOGRAM RAY CAGE INJECTION CT LUMBAR SPINE WITH INTRADISKAL CONTRAST TECHNIQUE: LUMBAR REGION PREPPED WITH BETADINE AND DRAPED USING STRICT ASEPTIC TECHNIQUE.  INTRAVENOUS VERSED WAS ADMINISTERED AS ANXIOLYTIC.   AFTER THE SKIN WAS INFILTRATED WITH BUFFERED LIDOCAINE, A CURVED 15 CM 22 GAUGE CHIBA NEEDLE WAS ADVANCED INTO THE L4-5 INTERSPACE FROM A LEFT POSTEROLATERAL APPROACH.  ONCE  NEEDLE TIP POSITION WAS CONFIRMED ON AP AND LATERAL FLUOROSCOPY, THE INTERSPACE WAS INJECTED WITH WATER SOLUBLE CONTRAST DURING CONTINUOUS FLUOROSCOPIC AND PRESSURE MONITORING.  THE NEEDLE WAS THEN REMOVED.   INTRAVENOUS FENTANYL AND VERSED WERE ADMINISTERED FOR CONSCIOUS SEDATION DURING CONTINUOUS RESPIRATORY MONITORING BY RADIOLOGY R.N.    THE SKIN WAS FURTHER ANESTHETIZED WITH BUFFERED LIDOCAINE AND 20 CM, 22 GAUGE CHIBA NEEDLE ADVANCED TO THE MARGIN OF THE LEFT RAY CAGE AT THE L5-S1 INTERSPACE USING A LEFT  POSTEROLATERAL APPROACH.  UNDER FLUOROSCOPY, THE NEEDLE WAS INJECTED.   IN A SIMILAR FASHION, A SECOND 20 CM 22 GAUGE CHIBA NEEDLE WAS ADVANCED TO THE MARGIN OF THE RIGHT CAGE AT L5-S1 AND THE NEEDLE INJECTED UNDER FLUOROSCOPY.   THE PATIENT WAS THEN TRANSFERRED TO CT FOR AXIAL SCANNING THROUGH THESE LEVELS.   NO IMMEDIATE COMPLICATION.   THE PATIENT WAS COOPERATIVE THROUGHOUT THE EXAMINATION, ALTHOUGH MODERATELY HYPERESTHETIC, AND SEEMED RELIABLE. FINDINGS: L4-5:  OPENING PRESSURE 25 PSI.   PRESSURE AT PAIN RESPONSE 50 PSI AFTER 1 ML OF CONTRAST.   THE PATIENT DESCRIBES PAIN AND PRESSURE IN HIS LOW BACK AND BUTTOCK AREA WITHOUT RADICULAR COMPONENT RATED 5 ON A PAIN SCALE OF 1 TO 10.  DISKOGRAPHY DEMONSTRATES NORMAL NUCLEAR SPREAD OF CONTRAST. CT DISKOGRAPHY DEMONSTRATES NORMAL NUCLEAR SPREAD OF CONTRAST WITHOUT ANY ANNULAR DEFECT.  THERE IS VERY MILD POSTERIOR DISK BULGE NOTED AS WELL AS EARLY BILATERAL FACET HYPERTROPHY. L5-S1:  INJECTION OF THE RAY CAGE ON THE LEFT IS NORMAL WITH NO CONTRAST FLOWING CENTRALLY AND A SMALL AMOUNT OF CONTRAST EXTRAVASATION PERIPHERALLY, OBTAINING INJECTION PRESSURES UP TO 100 PSI. INJECTION ON THE RIGHT DID RESULT IN CONTRAST FLOWING WITHIN THE INTERSTICES OF THE RAY CAGE AND ALSO MORE CENTRALLY WITHIN THE INTERSPACE.  THIS WAS CONFIRMED ON LATERAL IMAGING TO REPRESENT CONTRAST WITHIN THE INTERSPACE AND NOT FLOWING IN THE EPIDURAL SPACE ANTERIOR OR  POSTERIOR TO THE DISK.   CT THROUGH THIS LEVEL DEMONSTRATES THE TWO RAY CAGES PROJECTING IN EXPECTED LOCATION WITH THE LEFT SIDED CAGE PROJECTING JUST ANTERIOR TO THE ANTERIOR MARGIN OF THE SACRAL CORTEX.  THERE IS NO EVIDENCE OF LUCENCY AROUND THE HARDWARE. IMPRESSION 1)     MORPHOLOGY NORMAL DISKOGRAPHY AT L4-5 ALTHOUGH THERE WAS A CONCORDANT PAIN RESPONSE AS DETAILED ABOVE. 2)    ABNORMAL RAY CAGE INJECTION AT L5-S1 MANIFEST BY CONTRAST ENTERING THE INTERSTICES OF THE RIGHT SIDED STENT AND ENTERING THE INTERSPACE FROM THE RIGHT SIDED INJECTION.   Lumbar DG Diskogram views:  Results for orders placed in visit on 01/11/99  IR Diskography Lumbar   Narrative FINDINGS CLINICAL DATA:  BACK AND LEG PAIN, RIGHT WORSE THAN LEFT. ABNORMAL DISC AT L5-S1. MILDLY ABNORMAL DISK AT L4-5. DIAGNOSTIC LUMBAR DISC INJECTION L4-5: AN OBLIQUE APPROACH WAS TAKEN USING A CURVED 22 GAUGE CHIBA NEEDLE. THE OVERLYING SKIN WAS SCRUBBED WITH BETADINE AND STERILELY DRAPED. SKIN ANESTHESIA WAS CARRIED OUT USING 1% LIDOCAINE. THE NEEDLE WAS DIRECTED INTO THE NUCLEAR REGION OF THE [place]. CONTRAST MIXED WITH DILUTE ANCEF WAS INJECTED DURING FLUOROSCOPIC OBSERVATION AND PRESSURE MONITORING. OPENING PRESSURE WAS 40 PSI. THE DISC ACCOMMODATED ONLY 2 CC OF CONTRAST WITH ELEVATION OF INTRADISCAL PRESSURE TO 100 PSI. THERE IS A NORMAL NUCLEAR PATTERN. THE PATIENT FELT SOME MILD BACK PRESSURE BUT DID NOT FEEL HIS FAMILIAR PAIN. DIAGNOSTIC DISC INJECTION L5-S1: AN OBLIQUE APPROACH WAS TAKEN USING A CURVED 22 GAUGE CHIBA NEEDLE. THIS WAS POSITIONED IN THE NUCLEUS OF THE [place]. CONTRAST MIXED WITH DILUTE ANCEF WAS INJECTED DURING DIRECT OBSERVATION AND PRESSURE MONITORING. OPENING PRESSURE WAS 10 PSI. THE DISC ACCOMMODATED 3 CC OF CONTRAST WITH ELEVATION OF INTRADISCAL PRESSURE ONLY TO 40 PSI. THE PATIENT REPORTED REPRODUCTION OF HIS FAMILIAR HOME PAIN AFFECTING HIS LOW BACK.  THERE WAS SOME RADICULAR SYMPTOMATOLOGY TOWARDS THE  LEFT. IMAGING SHOWS AN ABNORMAL PATTERN OF CONTRAST EXTENSION WITH THE CONTRAST PERVADING THE NUCLEAR REGION AND EXTENDING INTO ANNULAR REGIONS, PARTICULARLY IN THE LEFT POSTEROLATERAL DIRECTION. IMPRESSION  IR Diskography Lumbar   Narrative FINDINGS CLINICAL DATA:  BACK AND LEG PAIN,  RIGHT WORSE THAN LEFT. ABNORMAL DISC AT L5-S1. MILDLY ABNORMAL DISK AT L4-5. DIAGNOSTIC LUMBAR DISC INJECTION L4-5: AN OBLIQUE APPROACH WAS TAKEN USING A CURVED 22 GAUGE CHIBA NEEDLE. THE OVERLYING SKIN WAS SCRUBBED WITH BETADINE AND STERILELY DRAPED. SKIN ANESTHESIA WAS CARRIED OUT USING 1% LIDOCAINE. THE NEEDLE WAS DIRECTED INTO THE NUCLEAR REGION OF THE [place]. CONTRAST MIXED WITH DILUTE ANCEF WAS INJECTED DURING FLUOROSCOPIC OBSERVATION AND PRESSURE MONITORING. OPENING PRESSURE WAS 40 PSI. THE DISC ACCOMMODATED ONLY 2 CC OF CONTRAST WITH ELEVATION OF INTRADISCAL PRESSURE TO 100 PSI. THERE IS A NORMAL NUCLEAR PATTERN. THE PATIENT FELT SOME MILD BACK PRESSURE BUT DID NOT FEEL HIS FAMILIAR PAIN. DIAGNOSTIC DISC INJECTION L5-S1: AN OBLIQUE APPROACH WAS TAKEN USING A CURVED 22 GAUGE CHIBA NEEDLE. THIS WAS POSITIONED IN THE NUCLEUS OF THE [place]. CONTRAST MIXED WITH DILUTE ANCEF WAS INJECTED DURING DIRECT OBSERVATION AND PRESSURE MONITORING. OPENING PRESSURE WAS 10 PSI. THE DISC ACCOMMODATED 3 CC OF CONTRAST WITH ELEVATION OF INTRADISCAL PRESSURE ONLY TO 40 PSI. THE PATIENT REPORTED REPRODUCTION OF HIS FAMILIAR HOME PAIN AFFECTING HIS LOW BACK.  THERE WAS SOME RADICULAR SYMPTOMATOLOGY TOWARDS THE LEFT. IMAGING SHOWS AN ABNORMAL PATTERN OF CONTRAST EXTENSION WITH THE CONTRAST PERVADING THE NUCLEAR REGION AND EXTENDING INTO ANNULAR REGIONS, PARTICULARLY IN THE LEFT POSTEROLATERAL DIRECTION. IMPRESSION   Lumbar DG Epidurogram OP:  Results for orders placed in visit on 05/15/01  DG Epidurography   Narrative FINDINGS CLINICAL DATA:  BACK AND BILATERAL LEG PAIN. THE PATIENT REPORTS NO SIGNIFICANT IMPROVEMENT  FOLLOWING THE RIGHT L4 SELECTIVE NERVE ROOT BLOCK AND TRANSFORAMINAL EPIDURAL.  IN ADDITION, NOW HE FEELS LIKE HE HAS INCREASED PAIN ON THE LEFT.  I ELECTED TO ATTEMPT A CAUDAL EPIDURAL TODAY. CAUDAL EPIDURAL NOTE IS MADE THAT THE PATIENT HAS A VERY LOW TOLERANCE FOR PAIN DUE TO NEEDLES. FOLLOWING INFORMED CONSENT, STERILE PREPARATION OF THE SACROCOCCYGEAL AREA, AND ADEQUATE LOCAL ANESTHESIA, A 22 GAUGE SPINAL NEEDLE WAS PLACED IN THE CAUDAL EPIDURAL SPACE.  CONTRAST INJECTION SHOWED GOOD CEPHALAD SPREAD TO L5-S1. I INJECTED 120 MG OF DEPO-MEDROL ALONG WITH 3 CC OF 1 PERCENT LIDOCAINE.  THE PATIENT TOLERATED THIS WITH SOME DIFFICULTY.  POST PROCEDURE HE WAS ABLE TO AMBULATE OF HIS OWN ACCORD, BUT WAS NOT SIGNIFICANTLY IMPROVED. THE PATIENT WAS COUNSELED REGARDING EXPECTED TIME COURSE OF STEROID AND ANESTHETIC. IMPRESSION TECHNICALLY SUCCESSFUL CAUDAL EPIDURAL; THIS IS #2 IN A SERIES OF INJECTIONS FOR THIS PATIENT.   Spine Imaging: Epidurography 1:  Results for orders placed in visit on 05/15/01  DG Epidurography   Narrative FINDINGS CLINICAL DATA:  BACK AND BILATERAL LEG PAIN. THE PATIENT REPORTS NO SIGNIFICANT IMPROVEMENT FOLLOWING THE RIGHT L4 SELECTIVE NERVE ROOT BLOCK AND TRANSFORAMINAL EPIDURAL.  IN ADDITION, NOW HE FEELS LIKE HE HAS INCREASED PAIN ON THE LEFT.  I ELECTED TO ATTEMPT A CAUDAL EPIDURAL TODAY. CAUDAL EPIDURAL NOTE IS MADE THAT THE PATIENT HAS A VERY LOW TOLERANCE FOR PAIN DUE TO NEEDLES. FOLLOWING INFORMED CONSENT, STERILE PREPARATION OF THE SACROCOCCYGEAL AREA, AND ADEQUATE LOCAL ANESTHESIA, A 22 GAUGE SPINAL NEEDLE WAS PLACED IN THE CAUDAL EPIDURAL SPACE.  CONTRAST INJECTION SHOWED GOOD CEPHALAD SPREAD TO L5-S1. I INJECTED 120 MG OF DEPO-MEDROL ALONG WITH 3 CC OF 1 PERCENT LIDOCAINE.  THE PATIENT TOLERATED THIS WITH SOME DIFFICULTY.  POST PROCEDURE HE WAS ABLE TO AMBULATE OF HIS OWN ACCORD, BUT WAS NOT SIGNIFICANTLY IMPROVED. THE PATIENT WAS COUNSELED REGARDING  EXPECTED TIME COURSE OF STEROID AND ANESTHETIC. IMPRESSION TECHNICALLY SUCCESSFUL CAUDAL EPIDURAL; THIS IS #2 IN A SERIES OF INJECTIONS FOR THIS  PATIENT.   Knee Imaging: Knee-R DG 1-2 views:  Results for orders placed during the hospital encounter of 03/18/16  DG Knee 1-2 Views Right   Narrative CLINICAL DATA:  Pain.  EXAM: RIGHT KNEE - 1-2 VIEW  COMPARISON:  No recent prior.  FINDINGS: No acute bony or joint abnormality identified. Mild medial compartment degenerative change. No evidence of fracture or dislocation.  IMPRESSION: Mild medial compartment degenerative change.  No acute abnormality.   Electronically Signed   By: Marcello Moores  Register   On: 03/18/2016 15:12    Knee-L DG 1-2 views:  Results for orders placed during the hospital encounter of 03/18/16  DG Knee 1-2 Views Left   Narrative CLINICAL DATA:  Pain.  No injury .  EXAM: LEFT KNEE - 1-2 VIEW  COMPARISON:  No recent prior .  FINDINGS: No acute bony or joint abnormality identified. No evidence of fracture or dislocation. Mild medial compartment degenerative change.  IMPRESSION: Mild medial compartment degenerative change.  No acute abnormality .   Electronically Signed   By: Marcello Moores  Register   On: 03/18/2016 15:11    Note: Results of ordered imaging test(s) reviewed and explained to patient in Layman's terms. Copy of results provided to patient  Meds  The patient has a current medication list which includes the following prescription(s): acetaminophen-codeine, cyclobenzaprine, dicyclomine, gabapentin, metoprolol, tadalafil, tramadol, trazodone, and ventolin hfa.  Current Outpatient Prescriptions on File Prior to Visit  Medication Sig  . acetaminophen-codeine (TYLENOL #3) 300-30 MG tablet Take 1-2 tablets by mouth every 4 (four) hours as needed for moderate pain.  Marland Kitchen dicyclomine (BENTYL) 20 MG tablet Take 1 tablet (20 mg total) by mouth 3 (three) times daily as needed for spasms.  . metoprolol  (LOPRESSOR) 50 MG tablet Take 1 tablet (50 mg total) by mouth 2 (two) times daily.  . tadalafil (CIALIS) 20 MG tablet Take 0.5-1 tablets (10-20 mg total) by mouth every other day as needed for erectile dysfunction.  . traZODone (DESYREL) 50 MG tablet Take 1 tablet (50 mg total) by mouth at bedtime as needed for sleep.  . VENTOLIN HFA 108 (90 Base) MCG/ACT inhaler INHALE 2 PUFFS INTO THE LUNGS EVERY 4 HOURS AS NEEDED FOR WHEEZING OR SHORTNESS OF BREATH.   No current facility-administered medications on file prior to visit.    ROS  Constitutional: Denies any fever or chills Gastrointestinal: No reported hemesis, hematochezia, vomiting, or acute GI distress Musculoskeletal: Denies any acute onset joint swelling, redness, loss of ROM, or weakness Neurological: No reported episodes of acute onset apraxia, aphasia, dysarthria, agnosia, amnesia, paralysis, loss of coordination, or loss of consciousness  Allergies  Mark [name] is allergic to cozaar [losartan] and lisinopril.  [place]  Drug: Mark [name]  reports that he does not use drugs. Alcohol:  reports that he does not drink alcohol. Tobacco:  reports that he has been smoking Cigarettes.  He has a 15.00 pack-year smoking history. He has never used smokeless tobacco. Medical:  has a past medical history of Alcohol withdrawal ([place]); Allergy; Anxiety (Dx 2014); COPD (chronic obstructive pulmonary disease) ([place]); Drug abuse; Enlarged prostate; ETOH abuse; GERD (gastroesophageal reflux disease) (Dx 2003); Headache(784.0); Hepatitis; Hyperlipidemia (Dx 2000); Hypertension (Dx 2011); IBS (irritable bowel syndrome); Irregular heart beat; Long Q-T syndrome (01/23/2014); Mental disorder; Neuromuscular disorder ([place]); Neuropathy ([place]) (06/07/2013); Seizure due to alcohol withdrawal ([place]) (01/23/2014); Shortness of breath; and Withdrawal seizures ([place]) (2014). Family: family history includes Alcohol abuse in his father and paternal grandfather; Breast cancer in  his paternal  grandmother; Colon polyps in his mother; Hypertension in his mother; Lung cancer in his father.  Past Surgical History:  Procedure Laterality Date  . [place] STUDY N/A 01/16/2015   Procedure: [place] STUDY;  Surgeon: Manus Gunning, MD;  Location: [facility];  Service: Gastroenterology;  Laterality: N/A;  . [place]  . COLONOSCOPY  1998  . LEFT HEART CATHETERIZATION WITH CORONARY ANGIOGRAM N/A 01/13/2014   Procedure: LEFT HEART CATHETERIZATION WITH CORONARY ANGIOGRAM;  Surgeon: Clent Demark, MD;  Location: [facility];  Service: Cardiovascular;  Laterality: N/A;  . NASAL SINUS SURGERY    . NECK SURGERY  2000   cervial fusion   . NISSEN FUNDOPLICATION    . SUBACROMIAL DECOMPRESSION Right 11/16/2014   Procedure: RIGHT SHOULDER ARTHROSCOPY WITH SUBACROMIAL DECOMPRESSION ;  Surgeon: Leandrew Koyanagi, MD;  Location: [facility];  Service: Orthopedics;  Laterality: Right;  . UPPER GASTROINTESTINAL ENDOSCOPY     Constitutional Exam  General appearance: Well nourished, well developed, and well hydrated. In no apparent acute distress Vitals:   04/16/16 0834  BP: (!) 148/86  Pulse: 66  Resp: 16  Temp: 97.9 F (36.6 C)  TempSrc: Oral  SpO2: 99%  Weight: 225 lb (102.1 kg)  Height: '6\' 4"'  (1.93 m)   BMI Assessment: Estimated body mass index is 27.39 kg/m as calculated from the following:   Height as of this encounter: '6\' 4"'  (1.93 m).   Weight as of this encounter: 225 lb (102.1 kg).  BMI interpretation table: BMI level Category Range association with higher incidence of chronic pain  <18 kg/m2 Underweight   18.5-24.9 kg/m2 Ideal body weight   25-29.9 kg/m2 Overweight Increased incidence by 20%  30-34.9 kg/m2 Obese (Class I) Increased incidence by 68%  35-39.9 kg/m2 Severe obesity (Class II) Increased incidence by 136%  >40 kg/m2 Extreme obesity (Class III) Increased incidence by 254%   BMI Readings from Last 4 Encounters:   04/16/16 27.39 kg/m  03/18/16 26.41 kg/m  02/19/16 26.41 kg/m  02/08/16 26.07 kg/m   Wt Readings from Last 4 Encounters:  04/16/16 225 lb (102.1 kg)  03/18/16 217 lb (98.4 kg)  02/19/16 217 lb (98.4 kg)  02/08/16 217 lb (98.4 kg)  Psych/Mental status: Alert, oriented x 3 (person, place, & time)       Eyes: PERLA Respiratory: No evidence of acute respiratory distress  Cervical Spine Exam  Inspection: No masses, redness, or swelling Alignment: Symmetrical Functional ROM: Unrestricted ROM Stability: No instability detected Muscle strength & Tone: Functionally intact Sensory: Unimpaired Palpation: Non-contributory  Upper Extremity (UE) Exam    Side: Right upper extremity  Side: Left upper extremity  Inspection: No masses, redness, swelling, or asymmetry. No contractures  Inspection: No masses, redness, swelling, or asymmetry. No contractures  Functional ROM: Unrestricted ROM          Functional ROM: Unrestricted ROM          Muscle strength & Tone: Functionally intact  Muscle strength & Tone: Functionally intact  Sensory: Unimpaired  Sensory: Unimpaired  Palpation: Euthermic  Palpation: Euthermic  Specialized Test(s): Deferred         Specialized Test(s): Deferred          Thoracic Spine Exam  Inspection: No masses, redness, or swelling Alignment: Symmetrical Functional ROM: Unrestricted ROM Stability: No instability detected Sensory: Unimpaired Muscle strength & Tone: Functionally intact Palpation: Non-contributory  Lumbar Spine Exam  Inspection: No masses, redness, or swelling Alignment: Symmetrical  Functional ROM: Unrestricted ROM Stability: No instability detected Muscle strength & Tone: Functionally intact Sensory: Unimpaired Palpation: Non-contributory Provocative Tests: Lumbar Hyperextension and rotation test: evaluation deferred today       Patrick's Maneuver: evaluation deferred today              Gait & Posture Assessment  Ambulation:  Unassisted Gait: Relatively normal for age and body habitus Posture: WNL   Lower Extremity Exam    Side: Right lower extremity  Side: Left lower extremity  Inspection: No masses, redness, swelling, or asymmetry. No contractures  Inspection: No masses, redness, swelling, or asymmetry. No contractures  Functional ROM: Unrestricted ROM          Functional ROM: Unrestricted ROM          Muscle strength & Tone: Functionally intact  Muscle strength & Tone: Functionally intact  Sensory: Unimpaired  Sensory: Unimpaired  Palpation: No palpable anomalies  Palpation: No palpable anomalies   Assessment & Plan  Primary Diagnosis & Pertinent Problem List: The primary encounter diagnosis was Chronic pain syndrome. Diagnoses of Cervical fusion syndrome, Chronic bilateral low back pain with bilateral sciatica, Chronic radicular low back pain, Neuropathy ([place]), Occipital headache(B)((R>L), Failed back surgical syndrome, and Musculoskeletal pain were also pertinent to this visit.  Visit Diagnosis: 1. Chronic pain syndrome   2. Cervical fusion syndrome   3. Chronic bilateral low back pain with bilateral sciatica   4. Chronic radicular low back pain   5. Neuropathy ([place])   6. Occipital headache(B)((R>L)   7. Failed back surgical syndrome   8. Musculoskeletal pain    Problems updated and reviewed during this visit: Problem  Chronic neck pain(4)(B)(R>L)  Chronic pain of both knees(6)(B)(R>L)  Chronic radicular low back pain(3)(R) (to calf)  Chronic right shoulder pain(5)(R)   [place] in March 2016 on right shoulder. 06/22/2014: [facility] [place]:  Partial full-thickness tear with minimal retraction of supraspinatus. Status post injection and referral to PT   Neuropathy, Peripheral ([place]) ([name]) (1)(B)(R>L)   Patient reports due to unknown etiology (history of alcohol abuse)   Chronic bilateral low back pain with bilateral sciatica(2)(B)(R>L)  Occipital headache(B)((R>L)   Problem-specific Plan(s): No  problem-specific Assessment & Plan notes found for this encounter.  Assessment & plan notes cannot be loaded without a specified hospital service.  Plan of Care  Pharmacotherapy (Medications Ordered): Meds ordered this encounter  Medications  . DISCONTD: gabapentin (NEURONTIN) 400 MG capsule    Sig: Take 2 capsules (800 mg total) by mouth 3 (three) times daily.    Dispense:  180 capsule    Refill:  2  . DISCONTD: traMADol (ULTRAM) 50 MG tablet    Sig: Take 1 tablet (50 mg total) by mouth 2 (two) times daily as needed for moderate pain.    Dispense:  60 tablet    Refill:  2  . DISCONTD: cyclobenzaprine (FLEXERIL) 10 MG tablet    Sig: Take 1 tablet (10 mg total) by mouth at bedtime as needed for muscle spasms.    Dispense:  60 tablet    Refill:  5  . traMADol (ULTRAM) 50 MG tablet    Sig: Take 1-2 tablets (50-100 mg total) by mouth 4 (four) times daily.    Dispense:  240 tablet    Refill:  0    Fill one day early if pharmacy is closed on scheduled refill date. Do not fill until: 04/16/16 To last until: 05/13/16  . cyclobenzaprine (FLEXERIL) 10 MG tablet    Sig: Take 1  tablet (10 mg total) by mouth at bedtime as needed for muscle spasms.    Dispense:  30 tablet    Refill:  0    Do not place medication on "Automatic Refill". Fill one day early if pharmacy is closed on scheduled refill date.  . gabapentin (NEURONTIN) 400 MG capsule    Sig: Take 2 capsules (800 mg total) by mouth 4 (four) times daily.    Dispense:  240 capsule    Refill:  0    Do not place medication on "Automatic Refill". Fill one day early if pharmacy is closed on scheduled refill date.   Lab-work, procedure(s), and/or referral(s): Orders Placed This Encounter  Procedures  . Caudal Epidural Injection  . MR LUMBAR SPINE W WO CONTRAST  . Ambulatory referral to Neurosurgery    Pharmacotherapy: Opioid Analgesics: We'll take over management today. See above orders Membrane stabilizer: We have discussed the  possibility of optimizing this mode of therapy, if tolerated Muscle relaxant: We have discussed the possibility of a trial NSAID: We have discussed the possibility of a trial Other analgesic(s): To be determined at a later time   Interventional therapies: Planned, scheduled, and/or pending:    Diagnostic Right Caudal ESI + Epidurogram.   Considering:   Lower extremity EMG/PNCV. (Radiculopathy versus peripheral neuropathy) Upper extremity EMG/PNCV. Diagnostic caudal epidural steroid injection + diagnostic epidurogram Possible Racz procedure Diagnostic cervical epidural steroid injection  Diagnostic bilateral cervical facet block  Possible bilateral cervical facet RFA Diagnostic bilateral lumbar facet block  Possible bilateral lumbar facet RFA  Diagnostic right intra-articular shoulder joint injection Diagnostic right suprascapular nerve block Possible right suprascapular RFA Diagnostic bilateral intra-articular knee joint injection  Possible bilateral series of 5 Hyalgan knee injections  Diagnostic bilateral Genicular nerve block  Possible bilateral Genicular RFA    PRN Procedures:   To be determined at a later time   Provider-requested follow-up: Return in about 1 month (around 05/14/2016) for (MD) Med-Mgmt, procedure (ASAP).  Future Appointments Date Time Provider [place]  04/22/2016 9:30 AM Milinda Pointer, MD [facility] None  04/25/2016 12:00 PM Jerel Shepherd, LCSW [facility] None  05/09/2016 12:00 PM Jerel Shepherd, LCSW [facility] None  05/14/2016 8:15 AM Milinda Pointer, MD [facility] None  05/15/2016 4:15 PM Norman Clay, MD [facility] None    Primary Care Physician: Boykin Nearing, MD Location: [facility] Note by: Kathlen Brunswick. Dossie Arbour, M.D, DABA, DABAPM, DABPM, DABIPP, FIPP Date: 04/16/2016; Time: 12:04 PM  Pain Score Disclaimer: We use the NRS-11 scale. This is a self-reported, subjective measurement of pain severity with only  modest accuracy. It is used primarily to identify changes within a particular patient. It must be understood that outpatient pain scales are significantly less accurate that those used for research, where they can be applied under ideal controlled circumstances with minimal exposure to variables. In reality, the score is likely to be a combination of pain intensity and pain affect, where pain affect describes the degree of emotional arousal or changes in action readiness caused by the sensory experience of pain. Factors such as social and work situation, setting, emotional state, anxiety levels, expectation, and prior pain experience may influence pain perception and show large inter-individual differences that may also be affected by time variables.  Patient instructions provided during this appointment: Patient Instructions      You have been given a pain scale handout, a prescription for tramadol and information about the upcoming procedure.  Do not eat or drink for 8  hours prior to the procedure, Bring a driver, Do not get flu shot within 2 weeks of procedure, take blood pressure medication if applicable.  You also have a prescription for gabapentin and flexeril at the pharmacy.  You also have a refferral to Neurosurgery and a MRI ordered.      Epidural Steroid Injection An epidural steroid injection is given to relieve pain in your neck, back, or legs that is caused by the irritation or swelling of a nerve root. This procedure involves injecting a steroid and numbing medicine (anesthetic) into the epidural space. The epidural space is the space between the outer covering of your spinal cord and the bones that form your backbone (vertebra).  LET [facility] CARE PROVIDER KNOW ABOUT:   Any allergies you have.  All medicines you are taking, including vitamins, herbs, eye drops, creams, and over-the-counter medicines such as aspirin.  Previous problems you or members of your family have had  with the use of anesthetics.  Any blood disorders or blood clotting disorders you have.  Previous surgeries you have had.  Medical conditions you have.  RISKS AND COMPLICATIONS Generally, this is a safe procedure. However, as with any procedure, complications can occur. Possible complications of epidural steroid injection include:  Headache.  Bleeding.  Infection.  Allergic reaction to the medicines.  Damage to your nerves. The response to this procedure depends on the underlying cause of the pain and its duration. People who have long-term (chronic) pain are less likely to benefit from epidural steroids than are those people whose pain comes on strong and suddenly.  BEFORE THE PROCEDURE   Ask your health care provider about changing or stopping your regular medicines. You may be advised to stop taking blood-thinning medicines a few days before the procedure.  You may be given medicines to reduce anxiety.  Arrange for someone to take you home after the procedure.  PROCEDURE   You will remain awake during the procedure. You may receive medicine to make you relaxed.  You will be asked to lie on your stomach.  The injection site will be cleaned.  The injection site will be numbed with a medicine (local anesthetic).  A needle will be injected through your skin into the epidural space.  Your health care provider will use an X-ray machine to ensure that the steroid is delivered closest to the affected nerve. You may have minimal discomfort at this time.  Once the needle is in the right position, the local anesthetic and the steroid will be injected into the epidural space.  The needle will then be removed and a bandage will be applied to the injection site.  AFTER THE PROCEDURE   You may be monitored for a short time before you go home.  You may feel weakness or numbness in your arm or leg, which disappears within hours.  You may be allowed to eat, drink, and take your  regular medicine.  You may have soreness at the site of the injection.   This information is not intended to replace advice given to you by your health care provider. Make sure you discuss any questions you have with your health care provider.   Document Released: 06/04/2007 Document Revised: 10/28/2012 Document Reviewed: 08/14/2012 Elsevier Interactive Patient Education 2016 [place]  What are the risk, side effects and possible complications? Generally speaking, most procedures are safe.  However, with any procedure there are risks, side effects, and the possibility of complications.  The risks and complications are dependent upon the sites that are lesioned, or the type of nerve block to be performed.  The closer the procedure is to the spine, the more serious the risks are.  Great care is taken when placing the radio frequency needles, block needles or lesioning probes, but sometimes complications can occur. Infection: Any time there is an injection through the skin, there is a risk of infection.  This is why sterile conditions are used for these blocks. There are four possible types of infection: 1. Localized skin infection. 2. Central Nervous System Infection: This can be in the form of Meningitis, which can be deadly. 3. Epidural Infections: This can be in the form of an epidural abscess, which can cause pressure inside of the spine, causing compression of the spinal cord with subsequent paralysis. This would require an emergency surgery to decompress, and there are no guarantees that the patient would recover from the paralysis. 4. Discitis: This is an infection of the intervertebral discs. It occurs in about 1% of discography procedures. It is difficult to treat and it may lead to surgery. Pain: the needles have to go through skin and soft tissues, will cause soreness. Damage to internal structures:  The nerves to be lesioned may be near blood  vessels or other nerves which can be potentially damaged. Bleeding: Bleeding is more common if the patient is taking blood thinners such as  aspirin, Coumadin, Ticiid, Plavix, etc., or if he/she have some genetic predisposition such as hemophilia. Bleeding into the spinal canal can cause compression of the spinal  cord with subsequent paralysis.  This would require an emergency surgery to decompress and there are no guarantees that the patient would recover from the paralysis. Pneumothorax: Puncturing of a lung is a possibility, every time a needle is introduced in the area of the chest or upper back.  Pneumothorax refers to free air around the collapsed lung(s), inside of the thoracic cavity (chest cavity).  Another two possible complications related to a similar event would include: Hemothorax and Chylothorax. These are variations of the Pneumothorax, where instead of air around the collapsed lung(s), you may have blood or chyle, respectively. Spinal headaches: They may occur with any procedures in the area of the spine. Persistent CSF (Cerebro-Spinal Fluid) leakage: This is a rare problem, but may occur with prolonged intrathecal or epidural catheters either due to the formation of a fistulous track or a dural tear. Nerve damage: By working so close to the spinal cord, there is always a possibility of nerve damage, which could be as serious as a permanent spinal cord injury with paralysis. Death: Although rare, severe deadly allergic reactions known as "Anaphylactic reaction" can occur to any of the medications used. Worsening of the symptoms: We can always make thing worse.  What are the chances of something like this happening? Chances of any of this occuring are extremely low.  By statistics, you have more of a chance of getting killed in a motor vehicle accident: while driving to the hospital than any of the above occurring .  Nevertheless, you should be aware that they are possibilities.  In  general, it is similar to taking a shower.  Everybody knows that you can slip, hit your head and get killed.  Does that mean that you should not shower again?  Nevertheless always keep in mind that statistics do not mean anything if you happen to be on the wrong side of them.  Even if a procedure  has a 1 (one) in a 1,000,000 (million) chance of going wrong, it you happen to be that one..Also, keep in mind that by statistics, you have more of a chance of having something go wrong when taking medications.  Who should not have this procedure? If you are on a blood thinning medication (e.g. Coumadin, Plavix, see list of "Blood Thinners"), or if you have an active infection going on, you should not have the procedure.  If you are taking any blood thinners, please inform your physician.  Preparing for your procedure: 1. Do not eat or drink anything at least eight (8) hours prior to the procedure. 2. Bring a driver with you .  It cannot be a taxi. 3. Come accompanied by an adult that can drive you back, and that is strong enough to help you if your legs get weak or numb from the local anesthetic. 4. Take all of your medicines the morning of the procedure with just enough water to swallow them. 5. If you have diabetes, make sure that you are scheduled to have your procedure done first thing in the morning, whenever possible. 6. If you have diabetes, take only half of your insulin dose and notify our nurse that you have done so as soon as you arrive at the clinic. 7. If you are diabetic, but only take blood sugar pills (oral hypoglycemic), then do not take them on the morning of your procedure.  You may take them after you have had the procedure. 8. Do not take aspirin or any aspirin-containing medications, at least eleven (11) days prior to the procedure.  They may prolong bleeding. 9. Wear loose fitting clothing that may be easy to take off and that you would not mind if it got stained with Betadine or  blood. 10. Do not wear any jewelry or perfume 11. Remove any nail coloring.  It will interfere with some of our monitoring equipment. 12. If you take Metformin for your diabetes, stop it 48 hours prior to the procedure.  NOTE: Remember that this is not meant to be interpreted as a complete list of all possible complications.  Unforeseen problems may occur.  BLOOD THINNERS The following drugs contain aspirin or other products, which can cause increased bleeding during surgery and should not be taken for 2 weeks prior to and 1 week after surgery.  If you should need take something for relief of minor pain, you may take acetaminophen which is found in Tylenol,m Datril, Anacin-3 and Panadol. It is not blood thinner. The products listed below are.  Do not take any of the products listed below in addition to any listed on your instruction sheet.  A.P.C or A.P.C with Codeine Codeine Phosphate Capsules #3 Ibuprofen Ridaura  ABC compound Congesprin Imuran rimadil  Advil Cope Indocin Robaxisal  Alka-Seltzer Effervescent Pain Reliever and Antacid Coricidin or Coricidin-D  Indomethacin Rufen  Alka-Seltzer plus Cold Medicine Cosprin Ketoprofen S-A-C Tablets  Anacin Analgesic Tablets or Capsules Coumadin Korlgesic Salflex  Anacin Extra Strength Analgesic tablets or capsules CP-2 Tablets Lanoril Salicylate  Anaprox Cuprimine Capsules Levenox Salocol  Anexsia-D Dalteparin Magan Salsalate  Anodynos Darvon compound Magnesium Salicylate Sine-off  Ansaid Dasin Capsules Magsal Sodium Salicylate  Anturane Depen Capsules Marnal Soma  APF Arthritis pain formula Dewitt's Pills Measurin Stanback  Argesic Dia-Gesic Meclofenamic Sulfinpyrazone  Arthritis Bayer Timed Release Aspirin Diclofenac Meclomen Sulindac  Arthritis pain formula Anacin Dicumarol Medipren Supac  Analgesic (Safety coated) Arthralgen Diffunasal Mefanamic Suprofen  Arthritis Strength Bufferin Dihydrocodeine Mepro  Compound Suprol  Arthropan liquid  Dopirydamole Methcarbomol with Aspirin Synalgos  ASA tablets/Enseals Disalcid Micrainin Tagament  Ascriptin Doan's Midol Talwin  Ascriptin A/D Dolene Mobidin Tanderil  Ascriptin Extra Strength Dolobid Moblgesic Ticlid  Ascriptin with Codeine Doloprin or Doloprin with Codeine Momentum Tolectin  Asperbuf Duoprin Mono-gesic Trendar  Aspergum Duradyne Motrin or Motrin IB Triminicin  Aspirin plain, buffered or enteric coated Durasal Myochrisine Trigesic  Aspirin Suppositories Easprin Nalfon Trillsate  Aspirin with Codeine Ecotrin Regular or Extra Strength Naprosyn Uracel  Atromid-S Efficin Naproxen Ursinus  Auranofin Capsules Elmiron Neocylate Vanquish  Axotal Emagrin Norgesic Verin  Azathioprine Empirin or Empirin with Codeine Normiflo Vitamin E  Azolid Emprazil Nuprin Voltaren  Bayer Aspirin plain, buffered or children's or timed BC Tablets or powders Encaprin Orgaran Warfarin Sodium  Buff-a-Comp Enoxaparin Orudis Zorpin  Buff-a-Comp with Codeine Equegesic Os-Cal-Gesic   Buffaprin Excedrin plain, buffered or Extra Strength Oxalid   Bufferin Arthritis Strength Feldene Oxphenbutazone   Bufferin plain or Extra Strength Feldene Capsules Oxycodone with Aspirin   Bufferin with Codeine Fenoprofen Fenoprofen Pabalate or Pabalate-SF   Buffets II Flogesic Panagesic   Buffinol plain or Extra Strength Florinal or Florinal with Codeine Panwarfarin   Buf-Tabs Flurbiprofen Penicillamine   Butalbital Compound Four-way cold tablets Penicillin   Butazolidin Fragmin Pepto-Bismol   Carbenicillin Geminisyn Percodan   Carna Arthritis Reliever Geopen Persantine   Carprofen Gold's salt Persistin   Chloramphenicol Goody's Phenylbutazone   Chloromycetin Haltrain Piroxlcam   Clmetidine heparin Plaquenil   Cllnoril Hyco-pap Ponstel   Clofibrate Hydroxy chloroquine Propoxyphen         Before stopping any of these medications, be sure to consult the physician who ordered them.  Some, such as Coumadin (Warfarin)  are ordered to prevent or treat serious conditions such as "deep thrombosis", "pumonary embolisms", and other heart problems.  The amount of time that you may need off of the medication may also vary with the medication and the reason for which you were taking it.  If you are taking any of these medications, please make sure you notify your pain physician before you undergo any procedures.Preparing for Procedure with Sedation Instructions: . Oral Intake: Do not eat or drink anything for at least 8 hours prior to your procedure. . Transportation: Public transportation is not allowed. Bring an adult driver. The driver must be physically present in our waiting room before any procedure can be started. Marland Kitchen Physical Assistance: Bring an adult capable of physically assisting you, in the event you need help. . Blood Pressure Medicine: Take your blood pressure medicine with a sip of water the morning of the procedure. . Insulin: Take only  of your normal insulin dose. . Preventing infections: Shower with an antibacterial soap the morning of your procedure. . Build-up your immune system: Take 1000 mg of Vitamin C with every meal (3 times a day) the day prior to your procedure. . Pregnancy: If you are pregnant, call and cancel the procedure. . Sickness: If you have a cold, fever, or any active infections, call and cancel the procedure. . Arrival: You must be in the facility at least 30 minutes prior to your scheduled procedure. . Children: Do not bring children with you. . Dress appropriately: Bring dark clothing that you would not mind if they get stained. . Valuables: Do not bring any jewelry or valuables. Procedure appointments are reserved for interventional treatments only. Marland Kitchen No Prescription Refills. . No medication changes will be discussed during procedure appointments. No disability issues will be  discussed.Risk(s) and Possible Complications  Patient Responsibilities: It is important that you read  this as it is part of your informed consent. It is our duty to inform you of the risks and possible complications associated with treatments offered to you. It is your responsibility as a patient to read this and to ask questions about anything that is not clear or that you believe was not covered in this document.  Patient's Rights: You have the right to refuse treatment. You also have the right to change your mind, even after initially having agreed to have the treatment done. However, under this last option, if you wait until the last second to change your mind, you may be charged for the materials used up to that point.  Introduction: Medicine is not an Chief Strategy Officer. Everything in Medicine, including the lack of treatment(s), carries the potential for danger, harm, or loss (which is by definition: Risk). In Medicine, a complication is a secondary problem, condition, or disease that can aggravate an already existing one. All treatments carry the risk of possible complications. The fact that a side effects or complications occurs, does not imply that the treatment was conducted incorrectly. It must be clearly understood that these can happen even when everything is done following the highest safety standards.  No treatment: You can choose not to proceed with the proposed treatment alternative. The "PRO(s)" would include: avoiding the risk of complications associated with the therapy. The "CON(s)" would include: not getting any of the treatment benefits. These benefits fall under one of three categories: diagnostic; therapeutic; and/or palliative. Diagnostic benefits include: getting information which can ultimately lead to improvement of the disease or symptom(s). Therapeutic benefits are those associated with the successful treatment of the disease. Finally, palliative benefits are those related to the decrease of the primary symptoms, without necessarily curing the condition (example: decreasing the pain  from a flare-up of a chronic condition, such as incurable terminal cancer).  General Risks and Complications: These are associated to most interventional treatments. They can occur alone, or in combination. They fall under one of the following six (6) categories: no benefit or worsening of symptoms; bleeding; infection; nerve damage; allergic reactions; and/or death. 1. No benefits or worsening of symptoms: In Medicine there are no guarantees, only probabilities. No healthcare provider can ever guarantee that a medical treatment will work, they can only state the probability that it may. Furthermore, there is always the possibility that the condition may worsen, either directly, or indirectly, as a consequence of the treatment. 2. Bleeding: This is more common if the patient is taking a blood thinner, either prescription or over the counter (example: Goody Powders, Fish oil, Aspirin, Garlic, etc.), or if suffering a condition associated with impaired coagulation (example: Hemophilia, cirrhosis of the liver, low platelet counts, etc.). However, even if you do not have one on these, it can still happen. If you have any of these conditions, or take one of these drugs, make sure to notify your treating physician. 3. Infection: This is more common in patients with a compromised immune system, either due to disease (example: diabetes, cancer, human immunodeficiency virus [HIV], etc.), or due to medications or treatments (example: therapies used to treat cancer and rheumatological diseases). However, even if you do not have one on these, it can still happen. If you have any of these conditions, or take one of these drugs, make sure to notify your treating physician. 4. Nerve Damage: This is more common when the treatment is  an invasive one, but it can also happen with the use of medications, such as those used in the treatment of cancer. The damage can occur to small secondary nerves, or to large primary ones, such  as those in the spinal cord and brain. This damage may be temporary or permanent and it may lead to impairments that can range from temporary numbness to permanent paralysis and/or brain death. 5. Allergic Reactions: Any time a substance or material comes in contact with our body, there is the possibility of an allergic reaction. These can range from a mild skin rash (contact dermatitis) to a severe systemic reaction (anaphylactic reaction), which can result in death. 6. Death: In general, any medical intervention can result in death, most of the time due to an unforeseen complication.   _

## 2016-04-16 ENCOUNTER — Encounter: Payer: Self-pay | Admitting: Pain Medicine

## 2016-04-16 ENCOUNTER — Ambulatory Visit: Payer: No Typology Code available for payment source | Attending: Pain Medicine | Admitting: Pain Medicine

## 2016-04-16 VITALS — BP 148/86 | HR 66 | Temp 97.9°F | Resp 16 | Ht 76.0 in | Wt 225.0 lb

## 2016-04-16 DIAGNOSIS — Z79891 Long term (current) use of opiate analgesic: Secondary | ICD-10-CM | POA: Insufficient documentation

## 2016-04-16 DIAGNOSIS — E785 Hyperlipidemia, unspecified: Secondary | ICD-10-CM | POA: Insufficient documentation

## 2016-04-16 DIAGNOSIS — M961 Postlaminectomy syndrome, not elsewhere classified: Secondary | ICD-10-CM

## 2016-04-16 DIAGNOSIS — M5442 Lumbago with sciatica, left side: Secondary | ICD-10-CM | POA: Insufficient documentation

## 2016-04-16 DIAGNOSIS — M7918 Myalgia, other site: Secondary | ICD-10-CM

## 2016-04-16 DIAGNOSIS — M5441 Lumbago with sciatica, right side: Secondary | ICD-10-CM | POA: Insufficient documentation

## 2016-04-16 DIAGNOSIS — M791 Myalgia: Secondary | ICD-10-CM | POA: Insufficient documentation

## 2016-04-16 DIAGNOSIS — Q761 Klippel-Feil syndrome: Secondary | ICD-10-CM | POA: Insufficient documentation

## 2016-04-16 DIAGNOSIS — M79671 Pain in right foot: Secondary | ICD-10-CM | POA: Insufficient documentation

## 2016-04-16 DIAGNOSIS — R45851 Suicidal ideations: Secondary | ICD-10-CM | POA: Insufficient documentation

## 2016-04-16 DIAGNOSIS — G8929 Other chronic pain: Secondary | ICD-10-CM

## 2016-04-16 DIAGNOSIS — F331 Major depressive disorder, recurrent, moderate: Secondary | ICD-10-CM | POA: Insufficient documentation

## 2016-04-16 DIAGNOSIS — M79672 Pain in left foot: Secondary | ICD-10-CM | POA: Insufficient documentation

## 2016-04-16 DIAGNOSIS — M79604 Pain in right leg: Secondary | ICD-10-CM | POA: Insufficient documentation

## 2016-04-16 DIAGNOSIS — M25511 Pain in right shoulder: Secondary | ICD-10-CM | POA: Insufficient documentation

## 2016-04-16 DIAGNOSIS — G629 Polyneuropathy, unspecified: Secondary | ICD-10-CM | POA: Insufficient documentation

## 2016-04-16 DIAGNOSIS — G894 Chronic pain syndrome: Secondary | ICD-10-CM | POA: Insufficient documentation

## 2016-04-16 DIAGNOSIS — R51 Headache: Secondary | ICD-10-CM

## 2016-04-16 DIAGNOSIS — I10 Essential (primary) hypertension: Secondary | ICD-10-CM | POA: Insufficient documentation

## 2016-04-16 DIAGNOSIS — M542 Cervicalgia: Secondary | ICD-10-CM | POA: Insufficient documentation

## 2016-04-16 DIAGNOSIS — Z79899 Other long term (current) drug therapy: Secondary | ICD-10-CM | POA: Insufficient documentation

## 2016-04-16 DIAGNOSIS — F17210 Nicotine dependence, cigarettes, uncomplicated: Secondary | ICD-10-CM | POA: Insufficient documentation

## 2016-04-16 DIAGNOSIS — Z5181 Encounter for therapeutic drug level monitoring: Secondary | ICD-10-CM | POA: Insufficient documentation

## 2016-04-16 DIAGNOSIS — M5416 Radiculopathy, lumbar region: Secondary | ICD-10-CM | POA: Insufficient documentation

## 2016-04-16 DIAGNOSIS — R519 Headache, unspecified: Secondary | ICD-10-CM | POA: Insufficient documentation

## 2016-04-16 DIAGNOSIS — M79605 Pain in left leg: Secondary | ICD-10-CM | POA: Insufficient documentation

## 2016-04-16 DIAGNOSIS — F411 Generalized anxiety disorder: Secondary | ICD-10-CM | POA: Insufficient documentation

## 2016-04-16 DIAGNOSIS — M25561 Pain in right knee: Secondary | ICD-10-CM | POA: Insufficient documentation

## 2016-04-16 DIAGNOSIS — M25562 Pain in left knee: Secondary | ICD-10-CM | POA: Insufficient documentation

## 2016-04-16 MED ORDER — CYCLOBENZAPRINE HCL 10 MG PO TABS: 10.0000 mg | ORAL_TABLET | Freq: Every evening | ORAL | 5 refills | Status: DC | PRN

## 2016-04-16 MED ORDER — GABAPENTIN 400 MG PO CAPS: 800.0000 mg | ORAL_CAPSULE | Freq: Four times a day (QID) | ORAL | 0 refills | Status: DC

## 2016-04-16 MED ORDER — TRAMADOL HCL 50 MG PO TABS: 50.0000 mg | ORAL_TABLET | Freq: Four times a day (QID) | ORAL | 0 refills | Status: DC

## 2016-04-16 MED ORDER — CYCLOBENZAPRINE HCL 10 MG PO TABS: 10.0000 mg | ORAL_TABLET | Freq: Every evening | ORAL | 0 refills | Status: DC | PRN

## 2016-04-16 MED ORDER — GABAPENTIN 400 MG PO CAPS: 800.0000 mg | ORAL_CAPSULE | Freq: Three times a day (TID) | ORAL | 2 refills | Status: DC

## 2016-04-16 MED ORDER — TRAMADOL HCL 50 MG PO TABS: 50.0000 mg | ORAL_TABLET | Freq: Two times a day (BID) | ORAL | 2 refills | Status: DC | PRN

## 2016-04-16 NOTE — Patient Instructions (Addendum)
You have been given a pain scale handout, a prescription for tramadol and information about the upcoming procedure.  Do not eat or drink for 8 hours prior to the procedure, Bring a driver, Do not get flu shot within 2 weeks of procedure, take blood pressure medication if applicable.  You also have a prescription for gabapentin and flexeril at the pharmacy.  You also have a refferral to Neurosurgery and a MRI ordered.      Epidural Steroid Injection An epidural steroid injection is given to relieve pain in your neck, back, or legs that is caused by the irritation or swelling of a nerve root. This procedure involves injecting a steroid and numbing medicine (anesthetic) into the epidural space. The epidural space is the space between the outer covering of your spinal cord and the bones that form your backbone (vertebra).  LET [facility] CARE PROVIDER KNOW ABOUT:   Any allergies you have.  All medicines you are taking, including vitamins, herbs, eye drops, creams, and over-the-counter medicines such as aspirin.  Previous problems you or members of your family have had with the use of anesthetics.  Any blood disorders or blood clotting disorders you have.  Previous surgeries you have had.  Medical conditions you have.  RISKS AND COMPLICATIONS Generally, this is a safe procedure. However, as with any procedure, complications can occur. Possible complications of epidural steroid injection include:  Headache.  Bleeding.  Infection.  Allergic reaction to the medicines.  Damage to your nerves. The response to this procedure depends on the underlying cause of the pain and its duration. People who have long-term (chronic) pain are less likely to benefit from epidural steroids than are those people whose pain comes on strong and suddenly.  BEFORE THE PROCEDURE   Ask your health care provider about changing or stopping your regular medicines. You may be advised to stop taking  blood-thinning medicines a few days before the procedure.  You may be given medicines to reduce anxiety.  Arrange for someone to take you home after the procedure.  PROCEDURE   You will remain awake during the procedure. You may receive medicine to make you relaxed.  You will be asked to lie on your stomach.  The injection site will be cleaned.  The injection site will be numbed with a medicine (local anesthetic).  A needle will be injected through your skin into the epidural space.  Your health care provider will use an X-ray machine to ensure that the steroid is delivered closest to the affected nerve. You may have minimal discomfort at this time.  Once the needle is in the right position, the local anesthetic and the steroid will be injected into the epidural space.  The needle will then be removed and a bandage will be applied to the injection site.  AFTER THE PROCEDURE   You may be monitored for a short time before you go home.  You may feel weakness or numbness in your arm or leg, which disappears within hours.  You may be allowed to eat, drink, and take your regular medicine.  You may have soreness at the site of the injection.   This information is not intended to replace advice given to you by your health care provider. Make sure you discuss any questions you have with your health care provider.   Document Released: 06/04/2007 Document Revised: 10/28/2012 Document Reviewed: 08/14/2012 Elsevier Interactive Patient Education 2016 [place]  What are the  risk, side effects and possible complications? Generally speaking, most procedures are safe.  However, with any procedure there are risks, side effects, and the possibility of complications.  The risks and complications are dependent upon the sites that are lesioned, or the type of nerve block to be performed.  The closer the procedure is to the spine, the more serious the risks  are.  Great care is taken when placing the radio frequency needles, block needles or lesioning probes, but sometimes complications can occur. Infection: Any time there is an injection through the skin, there is a risk of infection.  This is why sterile conditions are used for these blocks. There are four possible types of infection: 1. Localized skin infection. 2. Central Nervous System Infection: This can be in the form of Meningitis, which can be deadly. 3. Epidural Infections: This can be in the form of an epidural abscess, which can cause pressure inside of the spine, causing compression of the spinal cord with subsequent paralysis. This would require an emergency surgery to decompress, and there are no guarantees that the patient would recover from the paralysis. 4. Discitis: This is an infection of the intervertebral discs. It occurs in about 1% of discography procedures. It is difficult to treat and it may lead to surgery. Pain: the needles have to go through skin and soft tissues, will cause soreness. Damage to internal structures:  The nerves to be lesioned may be near blood vessels or other nerves which can be potentially damaged. Bleeding: Bleeding is more common if the patient is taking blood thinners such as  aspirin, Coumadin, Ticiid, Plavix, etc., or if he/she have some genetic predisposition such as hemophilia. Bleeding into the spinal canal can cause compression of the spinal  cord with subsequent paralysis.  This would require an emergency surgery to decompress and there are no guarantees that the patient would recover from the paralysis. Pneumothorax: Puncturing of a lung is a possibility, every time a needle is introduced in the area of the chest or upper back.  Pneumothorax refers to free air around the collapsed lung(s), inside of the thoracic cavity (chest cavity).  Another two possible complications related to a similar event would include: Hemothorax and Chylothorax. These are  variations of the Pneumothorax, where instead of air around the collapsed lung(s), you may have blood or chyle, respectively. Spinal headaches: They may occur with any procedures in the area of the spine. Persistent CSF (Cerebro-Spinal Fluid) leakage: This is a rare problem, but may occur with prolonged intrathecal or epidural catheters either due to the formation of a fistulous track or a dural tear. Nerve damage: By working so close to the spinal cord, there is always a possibility of nerve damage, which could be as serious as a permanent spinal cord injury with paralysis. Death: Although rare, severe deadly allergic reactions known as "Anaphylactic reaction" can occur to any of the medications used. Worsening of the symptoms: We can always make thing worse.  What are the chances of something like this happening? Chances of any of this occuring are extremely low.  By statistics, you have more of a chance of getting killed in a motor vehicle accident: while driving to the hospital than any of the above occurring .  Nevertheless, you should be aware that they are possibilities.  In general, it is similar to taking a shower.  Everybody knows that you can slip, hit your head and get killed.  Does that mean that you should not shower again?  Nevertheless always keep in mind that statistics do not mean anything if you happen to be on the wrong side of them.  Even if a procedure has a 1 (one) in a 1,000,000 (million) chance of going wrong, it you happen to be that one..Also, keep in mind that by statistics, you have more of a chance of having something go wrong when taking medications.  Who should not have this procedure? If you are on a blood thinning medication (e.g. Coumadin, Plavix, see list of "Blood Thinners"), or if you have an active infection going on, you should not have the procedure.  If you are taking any blood thinners, please inform your physician.  Preparing for your procedure: 1. Do not eat  or drink anything at least eight (8) hours prior to the procedure. 2. Bring a driver with you .  It cannot be a taxi. 3. Come accompanied by an adult that can drive you back, and that is strong enough to help you if your legs get weak or numb from the local anesthetic. 4. Take all of your medicines the morning of the procedure with just enough water to swallow them. 5. If you have diabetes, make sure that you are scheduled to have your procedure done first thing in the morning, whenever possible. 6. If you have diabetes, take only half of your insulin dose and notify our nurse that you have done so as soon as you arrive at the clinic. 7. If you are diabetic, but only take blood sugar pills (oral hypoglycemic), then do not take them on the morning of your procedure.  You may take them after you have had the procedure. 8. Do not take aspirin or any aspirin-containing medications, at least eleven (11) days prior to the procedure.  They may prolong bleeding. 9. Wear loose fitting clothing that may be easy to take off and that you would not mind if it got stained with Betadine or blood. 10. Do not wear any jewelry or perfume 11. Remove any nail coloring.  It will interfere with some of our monitoring equipment. 12. If you take Metformin for your diabetes, stop it 48 hours prior to the procedure.  NOTE: Remember that this is not meant to be interpreted as a complete list of all possible complications.  Unforeseen problems may occur.  BLOOD THINNERS The following drugs contain aspirin or other products, which can cause increased bleeding during surgery and should not be taken for 2 weeks prior to and 1 week after surgery.  If you should need take something for relief of minor pain, you may take acetaminophen which is found in Tylenol,m Datril, Anacin-3 and Panadol. It is not blood thinner. The products listed below are.  Do not take any of the products listed below in addition to any listed on your  instruction sheet.  A.P.C or A.P.C with Codeine Codeine Phosphate Capsules #3 Ibuprofen Ridaura  ABC compound Congesprin Imuran rimadil  Advil Cope Indocin Robaxisal  Alka-Seltzer Effervescent Pain Reliever and Antacid Coricidin or Coricidin-D  Indomethacin Rufen  Alka-Seltzer plus Cold Medicine Cosprin Ketoprofen S-A-C Tablets  Anacin Analgesic Tablets or Capsules Coumadin Korlgesic Salflex  Anacin Extra Strength Analgesic tablets or capsules CP-2 Tablets Lanoril Salicylate  Anaprox Cuprimine Capsules Levenox Salocol  Anexsia-D Dalteparin Magan Salsalate  Anodynos Darvon compound Magnesium Salicylate Sine-off  Ansaid Dasin Capsules Magsal Sodium Salicylate  Anturane Depen Capsules Marnal Soma  APF Arthritis pain formula Dewitt's Pills Measurin Stanback  Argesic Dia-Gesic Meclofenamic Sulfinpyrazone  Arthritis [employer]  Timed Release Aspirin Diclofenac Meclomen Sulindac  Arthritis pain formula Anacin Dicumarol Medipren Supac  Analgesic (Safety coated) Arthralgen Diffunasal Mefanamic Suprofen  Arthritis Strength Bufferin Dihydrocodeine Mepro Compound Suprol  Arthropan liquid Dopirydamole Methcarbomol with Aspirin Synalgos  ASA tablets/Enseals Disalcid Micrainin Tagament  Ascriptin Doan's Midol Talwin  Ascriptin A/D Dolene Mobidin Tanderil  Ascriptin Extra Strength Dolobid Moblgesic Ticlid  Ascriptin with Codeine Doloprin or Doloprin with Codeine Momentum Tolectin  Asperbuf Duoprin Mono-gesic Trendar  Aspergum Duradyne Motrin or Motrin IB Triminicin  Aspirin plain, buffered or enteric coated Durasal Myochrisine Trigesic  Aspirin Suppositories Easprin Nalfon Trillsate  Aspirin with Codeine Ecotrin Regular or Extra Strength Naprosyn Uracel  Atromid-S Efficin Naproxen Ursinus  Auranofin Capsules Elmiron Neocylate Vanquish  Axotal Emagrin Norgesic Verin  Azathioprine Empirin or Empirin with Codeine Normiflo Vitamin E  Azolid Emprazil Nuprin Voltaren  Bayer Aspirin plain, buffered or  children's or timed BC Tablets or powders Encaprin Orgaran Warfarin Sodium  Buff-a-Comp Enoxaparin Orudis Zorpin  Buff-a-Comp with Codeine Equegesic Os-Cal-Gesic   Buffaprin Excedrin plain, buffered or Extra Strength Oxalid   Bufferin Arthritis Strength Feldene Oxphenbutazone   Bufferin plain or Extra Strength Feldene Capsules Oxycodone with Aspirin   Bufferin with Codeine Fenoprofen Fenoprofen Pabalate or Pabalate-SF   Buffets II Flogesic Panagesic   Buffinol plain or Extra Strength Florinal or Florinal with Codeine Panwarfarin   Buf-Tabs Flurbiprofen Penicillamine   Butalbital Compound Four-way cold tablets Penicillin   Butazolidin Fragmin Pepto-Bismol   Carbenicillin Geminisyn Percodan   Carna Arthritis Reliever Geopen Persantine   Carprofen Gold's salt Persistin   Chloramphenicol Goody's Phenylbutazone   Chloromycetin Haltrain Piroxlcam   Clmetidine heparin Plaquenil   Cllnoril Hyco-pap Ponstel   Clofibrate Hydroxy chloroquine Propoxyphen         Before stopping any of these medications, be sure to consult the physician who ordered them.  Some, such as Coumadin (Warfarin) are ordered to prevent or treat serious conditions such as "deep thrombosis", "pumonary embolisms", and other heart problems.  The amount of time that you may need off of the medication may also vary with the medication and the reason for which you were taking it.  If you are taking any of these medications, please make sure you notify your pain physician before you undergo any procedures.Preparing for Procedure with Sedation Instructions: . Oral Intake: Do not eat or drink anything for at least 8 hours prior to your procedure. . Transportation: Public transportation is not allowed. Bring an adult driver. The driver must be physically present in our waiting room before any procedure can be started. Marland Kitchen Physical Assistance: Bring an adult capable of physically assisting you, in the event you need help. . Blood Pressure  Medicine: Take your blood pressure medicine with a sip of water the morning of the procedure. . Insulin: Take only  of your normal insulin dose. . Preventing infections: Shower with an antibacterial soap the morning of your procedure. . Build-up your immune system: Take 1000 mg of Vitamin C with every meal (3 times a day) the day prior to your procedure. . Pregnancy: If you are pregnant, call and cancel the procedure. . Sickness: If you have a cold, fever, or any active infections, call and cancel the procedure. . Arrival: You must be in the facility at least 30 minutes prior to your scheduled procedure. . Children: Do not bring children with you. . Dress appropriately: Bring dark clothing that you would not mind if they get stained. . Valuables: Do not bring any jewelry or  valuables. Procedure appointments are reserved for interventional treatments only. Marland Kitchen No Prescription Refills. . No medication changes will be discussed during procedure appointments. No disability issues will be discussed.Risk(s) and Possible Complications  Patient Responsibilities: It is important that you read this as it is part of your informed consent. It is our duty to inform you of the risks and possible complications associated with treatments offered to you. It is your responsibility as a patient to read this and to ask questions about anything that is not clear or that you believe was not covered in this document.  Patient's Rights: You have the right to refuse treatment. You also have the right to change your mind, even after initially having agreed to have the treatment done. However, under this last option, if you wait until the last second to change your mind, you may be charged for the materials used up to that point.  Introduction: Medicine is not an Chief Strategy Officer. Everything in Medicine, including the lack of treatment(s), carries the potential for danger, harm, or loss (which is by definition: Risk). In  Medicine, a complication is a secondary problem, condition, or disease that can aggravate an already existing one. All treatments carry the risk of possible complications. The fact that a side effects or complications occurs, does not imply that the treatment was conducted incorrectly. It must be clearly understood that these can happen even when everything is done following the highest safety standards.  No treatment: You can choose not to proceed with the proposed treatment alternative. The "PRO(s)" would include: avoiding the risk of complications associated with the therapy. The "CON(s)" would include: not getting any of the treatment benefits. These benefits fall under one of three categories: diagnostic; therapeutic; and/or palliative. Diagnostic benefits include: getting information which can ultimately lead to improvement of the disease or symptom(s). Therapeutic benefits are those associated with the successful treatment of the disease. Finally, palliative benefits are those related to the decrease of the primary symptoms, without necessarily curing the condition (example: decreasing the pain from a flare-up of a chronic condition, such as incurable terminal cancer).  General Risks and Complications: These are associated to most interventional treatments. They can occur alone, or in combination. They fall under one of the following six (6) categories: no benefit or worsening of symptoms; bleeding; infection; nerve damage; allergic reactions; and/or death. 1. No benefits or worsening of symptoms: In Medicine there are no guarantees, only probabilities. No healthcare provider can ever guarantee that a medical treatment will work, they can only state the probability that it may. Furthermore, there is always the possibility that the condition may worsen, either directly, or indirectly, as a consequence of the treatment. 2. Bleeding: This is more common if the patient is taking a blood thinner, either  prescription or over the counter (example: Goody Powders, Fish oil, Aspirin, Garlic, etc.), or if suffering a condition associated with impaired coagulation (example: Hemophilia, cirrhosis of the liver, low platelet counts, etc.). However, even if you do not have one on these, it can still happen. If you have any of these conditions, or take one of these drugs, make sure to notify your treating physician. 3. Infection: This is more common in patients with a compromised immune system, either due to disease (example: diabetes, cancer, human immunodeficiency virus [HIV], etc.), or due to medications or treatments (example: therapies used to treat cancer and rheumatological diseases). However, even if you do not have one on these, it can still happen. If you have  any of these conditions, or take one of these drugs, make sure to notify your treating physician. 4. Nerve Damage: This is more common when the treatment is an invasive one, but it can also happen with the use of medications, such as those used in the treatment of cancer. The damage can occur to small secondary nerves, or to large primary ones, such as those in the spinal cord and brain. This damage may be temporary or permanent and it may lead to impairments that can range from temporary numbness to permanent paralysis and/or brain death. 5. Allergic Reactions: Any time a substance or material comes in contact with our body, there is the possibility of an allergic reaction. These can range from a mild skin rash (contact dermatitis) to a severe systemic reaction (anaphylactic reaction), which can result in death. 6. Death: In general, any medical intervention can result in death, most of the time due to an unforeseen complication.   _

## 2016-04-16 NOTE — Progress Notes (Signed)
Safety precautions to be maintained throughout the outpatient stay will include: orient to surroundings, keep bed in low position, maintain call bell within reach at all times, provide assistance with transfer out of bed and ambulation.  

## 2016-04-17 ENCOUNTER — Other Ambulatory Visit: Payer: Self-pay

## 2016-04-22 ENCOUNTER — Encounter: Payer: Self-pay | Admitting: Pain Medicine

## 2016-04-22 ENCOUNTER — Ambulatory Visit
Admission: RE | Admit: 2016-04-22 | Discharge: 2016-04-22 | Disposition: A | Discharge status: Home / Self Care | Payer: Self-pay | Source: Ambulatory Visit | Attending: Pain Medicine | Admitting: Pain Medicine

## 2016-04-22 ENCOUNTER — Ambulatory Visit (HOSPITAL_BASED_OUTPATIENT_CLINIC_OR_DEPARTMENT_OTHER): Payer: Self-pay | Admitting: Pain Medicine

## 2016-04-22 VITALS — BP 127/80 | HR 61 | Temp 97.3°F | Resp 16 | Ht 76.0 in | Wt 225.0 lb

## 2016-04-22 DIAGNOSIS — M5441 Lumbago with sciatica, right side: Secondary | ICD-10-CM | POA: Insufficient documentation

## 2016-04-22 DIAGNOSIS — M961 Postlaminectomy syndrome, not elsewhere classified: Secondary | ICD-10-CM | POA: Insufficient documentation

## 2016-04-22 DIAGNOSIS — M5442 Lumbago with sciatica, left side: Secondary | ICD-10-CM | POA: Insufficient documentation

## 2016-04-22 DIAGNOSIS — M5416 Radiculopathy, lumbar region: Secondary | ICD-10-CM

## 2016-04-22 DIAGNOSIS — G8929 Other chronic pain: Secondary | ICD-10-CM

## 2016-04-22 MED ORDER — FENTANYL CITRATE (PF) 100 MCG/2ML IJ SOLN: 25.0000 ug | INTRAMUSCULAR | Status: DC | PRN

## 2016-04-22 MED ORDER — SODIUM CHLORIDE 0.9 % IJ SOLN
INTRAMUSCULAR | Status: AC
  Filled 2016-04-22: qty 10

## 2016-04-22 MED ORDER — LIDOCAINE HCL (PF) 1 % IJ SOLN
INTRAMUSCULAR | Status: AC
  Filled 2016-04-22: qty 5

## 2016-04-22 MED ORDER — LACTATED RINGERS IV SOLN
1000.0000 mL | Freq: Once | INTRAVENOUS | Status: AC
  Administered 2016-04-22: 1000 mL via INTRAVENOUS

## 2016-04-22 MED ORDER — SODIUM CHLORIDE 0.9% FLUSH
2.0000 mL | Freq: Once | INTRAVENOUS | Status: AC
  Administered 2016-04-22: 2 mL

## 2016-04-22 MED ORDER — TRIAMCINOLONE ACETONIDE 40 MG/ML IJ SUSP
INTRAMUSCULAR | Status: AC
  Filled 2016-04-22: qty 1

## 2016-04-22 MED ORDER — LIDOCAINE HCL (PF) 1 % IJ SOLN
10.0000 mL | Freq: Once | INTRAMUSCULAR | Status: AC
  Administered 2016-04-22: 10 mL

## 2016-04-22 MED ORDER — TRIAMCINOLONE ACETONIDE 40 MG/ML IJ SUSP
40.0000 mg | Freq: Once | INTRAMUSCULAR | Status: AC
  Administered 2016-04-22: 40 mg

## 2016-04-22 MED ORDER — ROPIVACAINE HCL 2 MG/ML IJ SOLN
2.0000 mL | Freq: Once | INTRAMUSCULAR | Status: AC
  Administered 2016-04-22: 2 mL via EPIDURAL

## 2016-04-22 MED ORDER — FENTANYL CITRATE (PF) 100 MCG/2ML IJ SOLN
INTRAMUSCULAR | Status: AC
  Administered 2016-04-22: 100 ug via INTRAVENOUS
  Filled 2016-04-22: qty 2

## 2016-04-22 MED ORDER — MIDAZOLAM HCL 5 MG/5ML IJ SOLN: 1.0000 mg | INTRAMUSCULAR | Status: DC | PRN

## 2016-04-22 MED ORDER — MIDAZOLAM HCL 5 MG/5ML IJ SOLN
INTRAMUSCULAR | Status: AC
  Administered 2016-04-22: 3 mg via INTRAVENOUS
  Filled 2016-04-22: qty 5

## 2016-04-22 MED ORDER — IOPAMIDOL (ISOVUE-M 200) INJECTION 41%
INTRAMUSCULAR | Status: AC
  Filled 2016-04-22: qty 10

## 2016-04-22 MED ORDER — ROPIVACAINE HCL 2 MG/ML IJ SOLN
INTRAMUSCULAR | Status: AC
  Filled 2016-04-22: qty 20

## 2016-04-22 MED ORDER — IOPAMIDOL (ISOVUE-M 200) INJECTION 41%
10.0000 mL | Freq: Once | INTRAMUSCULAR | Status: AC
  Administered 2016-04-22: 10:00:00 via EPIDURAL

## 2016-04-22 NOTE — Patient Instructions (Addendum)
Steps to Quit Smoking Smoking tobacco can be bad for your health. It can also affect almost every organ in your body. Smoking puts you and people around you at risk for many serious long-lasting (chronic) diseases. Quitting smoking is hard, but it is one of the best things that you can do for your health. It is never too late to quit. What are the benefits of quitting smoking? When you quit smoking, you lower your risk for getting serious diseases and conditions. They can include:  Lung cancer or lung disease.  Heart disease.  Stroke.  Heart attack.  Not being able to have children (infertility).  Weak bones (osteoporosis) and broken bones (fractures). If you have coughing, wheezing, and shortness of breath, those symptoms may get better when you quit. You may also get sick less often. If you are pregnant, quitting smoking can help to lower your chances of having a baby of low birth weight. What can I do to help me quit smoking? Talk with your doctor about what can help you quit smoking. Some things you can do (strategies) include:  Quitting smoking totally, instead of slowly cutting back how much you smoke over a period of time.  Going to in-person counseling. You are more likely to quit if you go to many counseling sessions.  Using resources and support systems, such as:  Online chats with a counselor.  Phone quitlines.  Printed self-help materials.  Support groups or group counseling.  Text messaging programs.  Mobile phone apps or applications.  Taking medicines. Some of these medicines may have nicotine in them. If you are pregnant or breastfeeding, do not take any medicines to quit smoking unless your doctor says it is okay. Talk with your doctor about counseling or other things that can help you. Talk with your doctor about using more than one strategy at the same time, such as taking medicines while you are also going to in-person counseling. This can help make quitting  easier. What things can I do to make it easier to quit? Quitting smoking might feel very hard at first, but there is a lot that you can do to make it easier. Take these steps:  Talk to your family and friends. Ask them to support and encourage you.  Call phone quitlines, reach out to support groups, or work with a counselor.  Ask people who smoke to not smoke around you.  Avoid places that make you want (trigger) to smoke, such as:  Bars.  Parties.  Smoke-break areas at work.  Spend time with people who do not smoke.  Lower the stress in your life. Stress can make you want to smoke. Try these things to help your stress:  Getting regular exercise.  Deep-breathing exercises.  Yoga.  Meditating.  Doing a body scan. To do this, close your eyes, focus on one area of your body at a time from head to toe, and notice which parts of your body are tense. Try to relax the muscles in those areas.  Download or buy apps on your mobile phone or tablet that can help you stick to your quit plan. There are many free apps, such as QuitGuide from the CDC (Centers for Disease Control and Prevention). You can find more support from smokefree.gov and other websites. This information is not intended to replace advice given to you by your health care provider. Make sure you discuss any questions you have with your health care provider. Document Released: 12/22/2008 Document Revised: 10/24/2015 Document   Reviewed: 07/12/2014 Elsevier Interactive Patient Education  2017 [employer]. Pain Management Discharge Instructions  General Discharge Instructions :  If you need to reach your doctor call: Monday-Friday 8:00 am - 4:00 pm at [phone] or toll free [phone].  After clinic hours [phone] to have operator reach doctor.  Bring all of your medication bottles to all your appointments in the pain clinic.  To cancel or reschedule your appointment with Pain Management please remember to call  24 hours in advance to avoid a fee.  Refer to the educational materials which you have been given on: General Risks, I had my Procedure. Discharge Instructions, Post Sedation.  Post Procedure Instructions:  The drugs you were given will stay in your system until tomorrow, so for the next 24 hours you should not drive, make any legal decisions or drink any alcoholic beverages.  You may eat anything you prefer, but it is better to start with liquids then soups and crackers, and gradually work up to solid foods.  Please notify your doctor immediately if you have any unusual bleeding, trouble breathing or pain that is not related to your normal pain.  Depending on the type of procedure that was done, some parts of your body may feel week and/or numb.  This usually clears up by tonight or the next day.  Walk with the use of an assistive device or accompanied by an adult for the 24 hours.  You may use ice on the affected area for the first 24 hours.  Put ice in a Ziploc bag and cover with a towel and place against area 15 minutes on 15 minutes off.  You may switch to heat after 24 hours. Epidural Steroid Injection An epidural steroid injection is a shot of steroid medicine and numbing medicine that is given into the space between the spinal cord and the bones in your back (epidural space). The shot helps relieve pain caused by an irritated or swollen nerve root. The amount of pain relief you get from the injection depends on what is causing the nerve to be swollen and irritated, and how long your pain lasts. You are more likely to benefit from this injection if your pain is strong and comes on suddenly rather than if you have had pain for a long time. Tell a health care provider about:  Any allergies you have.  All medicines you are taking, including vitamins, herbs, eye drops, creams, and over-the-counter medicines.  Any problems you or family members have had with anesthetic medicines.  Any  blood disorders you have.  Any surgeries you have had.  Any medical conditions you have.  Whether you are pregnant or may be pregnant. What are the risks? Generally, this is a safe procedure. However, problems may occur, including:  Headache.  Bleeding.  Infection.  Allergic reaction to medicines.  Damage to your nerves. What happens before the procedure? Staying hydrated  Follow instructions from your health care provider about hydration, which may include:  Up to 2 hours before the procedure - you may continue to drink clear liquids, such as water, clear fruit juice, black coffee, and plain tea. Eating and drinking restrictions  Follow instructions from your health care provider about eating and drinking, which may include:  8 hours before the procedure - stop eating heavy meals or foods such as meat, fried foods, or fatty foods.  6 hours before the procedure - stop eating light meals or foods, such as toast or cereal.  6 hours before  the procedure - stop drinking milk or drinks that contain milk.  2 hours before the procedure - stop drinking clear liquids. Medicine  You may be given medicines to lower anxiety.  Ask your health care provider about:  Changing or stopping your regular medicines. This is especially important if you are taking diabetes medicines or blood thinners.  Taking medicines such as aspirin and ibuprofen. These medicines can thin your blood. Do not take these medicines before your procedure if your health care provider instructs you not to. General instructions  Plan to have someone take you home from the hospital or clinic. What happens during the procedure?  You may receive a medicine to help you relax (sedative).  You will be asked to lie on your abdomen.  The injection site will be cleaned.  A numbing medicine (local anesthetic) will be used to numb the injection site.  A needle will be inserted through your skin into the epidural  space. You may feel some discomfort when this happens. An X-ray machine will be used to make sure the needle is put as close as possible to the affected nerve.  A steroid medicine and a local anesthetic will be injected into the epidural space.  The needle will be removed.  A bandage (dressing) will be put over the injection site. What happens after the procedure?  Your blood pressure, heart rate, breathing rate, and blood oxygen level will be monitored until the medicines you were given have worn off.  Your arm or leg may feel weak or numb for a few hours.  The injection site may feel sore.  Do not drive for 24 hours if you received a sedative. This information is not intended to replace advice given to you by your health care provider. Make sure you discuss any questions you have with your health care provider. Document Released: 06/04/2007 Document Revised: 08/09/2015 Document Reviewed: 06/13/2015 Elsevier Interactive Patient Education  2017 [employer].

## 2016-04-22 NOTE — Progress Notes (Signed)
Safety precautions to be maintained throughout the outpatient stay will include: orient to surroundings, keep bed in low position, maintain call bell within reach at all times, provide assistance with transfer out of bed and ambulation.  

## 2016-04-22 NOTE — Progress Notes (Signed)
Patient's Name: Mark [name]  MRN: [id number]  Referring Provider: Milinda Pointer, MD  DOB: September 12, 1965  PCP: Boykin Nearing, MD  DOS: 04/22/2016  Note by: Kathlen Brunswick. Dossie Arbour, MD  Service setting: Ambulatory outpatient  Location: [facility] (AMB) Pain Management Facility  Visit type: Procedure  Specialty: Interventional Pain Management  Patient type: Established   Primary Reason for Visit: Interventional Pain Management Treatment. CC: Back Pain (low)  Procedure:  Anesthesia, Analgesia, Anxiolysis:  Type: Diagnostic Epidural Steroid Injection + Diagnostic Epidurogram Region: Caudal Level: Sacrococcygeal   Laterality: Midline aiming at the right  Type: Local Anesthesia with Moderate (Conscious) Sedation Local Anesthetic: Lidocaine 1% Route: Intravenous (IV) IV Access: Secured Sedation: Meaningful verbal contact was maintained at all times during the procedure  Indication(s): Analgesia and Anxiety  Indications: 1. Chronic radicular low back pain   2. Chronic bilateral low back pain with bilateral sciatica   3. Failed back surgical syndrome    Pain Score: Pre-procedure: 4 /10 Post-procedure: 3 /10  Pre-op Assessment:  Previous date of service: 04/16/16 Service provided: Evaluation Mr. Heisser is a 51 y.o. (year old), male patient, seen today for interventional treatment. He  has a past surgical history that includes Back surgery (1995, 1999); Neck surgery (2000); Nasal sinus surgery; left heart catheterization with coronary angiogram (N/A, 01/13/2014); Subacromial decompression (Right, 11/16/2014); Nissen fundoplication; 24 hour ph study (N/A, 01/16/2015); Upper gastrointestinal endoscopy; and Colonoscopy (1998). His primarily concern today is the Back Pain (low)  Initial Vital Signs: Blood pressure 131/89, pulse 62, temperature 98.2 F (36.8 C), resp. rate 18, height 6\' 4"  (1.93 m), weight 225 lb (102.1 kg), SpO2 99 %. BMI: 27.39 kg/m  Risk Assessment: Allergies: Reviewed. He is  allergic to cozaar [losartan] and lisinopril.  Allergy Precautions: None required Coagulopathies: "Reviewed. None identified.  Blood-thinner therapy: None at this time Active Infection(s): Reviewed. None identified. Mr. Lakatos is afebrile  Site Confirmation: Mr. Winterrowd was asked to confirm the procedure and laterality before marking the site Procedure checklist: Completed Consent: Before the procedure and under the influence of no sedative(s), amnesic(s), or anxiolytics, the patient was informed of the treatment options, risks and possible complications. To fulfill our ethical and legal obligations, as recommended by the American Medical Association's Code of Ethics, I have informed the patient of my clinical impression; the nature and purpose of the treatment or procedure; the risks, benefits, and possible complications of the intervention; the alternatives, including doing nothing; the risk(s) and benefit(s) of the alternative treatment(s) or procedure(s); and the risk(s) and benefit(s) of doing nothing. The patient was provided information about the general risks and possible complications associated with the procedure. These may include, but are not limited to: failure to achieve desired goals, infection, bleeding, organ or nerve damage, allergic reactions, paralysis, and death. In addition, the patient was informed of those risks and complications associated to Spine-related procedures, such as failure to decrease pain; infection (i.e.: Meningitis, epidural or intraspinal abscess); bleeding (i.e.: epidural hematoma, subarachnoid hemorrhage, or any other type of intraspinal or peri-dural bleeding); organ or nerve damage (i.e.: Any type of peripheral nerve, nerve root, or spinal cord injury) with subsequent damage to sensory, motor, and/or autonomic systems, resulting in permanent pain, numbness, and/or weakness of one or several areas of the body; allergic reactions; (i.e.: anaphylactic reaction);  and/or death. Furthermore, the patient was informed of those risks and complications associated with the medications. These include, but are not limited to: allergic reactions (i.e.: anaphylactic or anaphylactoid reaction(s)); adrenal axis suppression; blood sugar  elevation that in diabetics may result in ketoacidosis or comma; water retention that in patients with history of congestive heart failure may result in shortness of breath, pulmonary edema, and decompensation with resultant heart failure; weight gain; swelling or edema; medication-induced neural toxicity; particulate matter embolism and blood vessel occlusion with resultant organ, and/or nervous system infarction; and/or aseptic necrosis of one or more joints. Finally, the patient was informed that Medicine is not an exact science; therefore, there is also the possibility of unforeseen or unpredictable risks and/or possible complications that may result in a catastrophic outcome. The patient indicated having understood very clearly. We have given the patient no guarantees and we have made no promises. Enough time was given to the patient to ask questions, all of which were answered to the patient's satisfaction. Mr. Gourdin has indicated that he wanted to continue with the procedure. Attestation: I, the ordering provider, attest that I have discussed with the patient the benefits, risks, side-effects, alternatives, likelihood of achieving goals, and potential problems during recovery for the procedure that I have provided informed consent. Date: 04/22/2016; Time: 9:15 AM  Pre-Procedure Preparation:  Monitoring: As per clinic protocol. Respiration, ETCO2, SpO2, BP, heart rate and rhythm monitor placed and checked for adequate function Safety Precautions: Patient was assessed for positional comfort and pressure points before starting the procedure. Time-out: I initiated and conducted the "Time-out" before starting the procedure, as per protocol.  The patient was asked to participate by confirming the accuracy of the "Time Out" information. Verification of the correct person, site, and procedure were performed and confirmed by me, the nursing staff, and the patient. "Time-out" conducted as per Joint Commission's Universal Protocol (UP.01.01.01). "Time-out" Date & Time: 04/22/2016; 0941 hrs.  Description of Procedure Process:   Position: Prone Target Area: Caudal Epidural Canal. Approach: Midline approach. Area Prepped: Entire Posterior Sacrococcygeal Region Prepping solution: ChloraPrep (2% chlorhexidine gluconate and 70% isopropyl alcohol) Safety Precautions: Aspiration looking for blood return was conducted prior to all injections. At no point did we inject any substances, as a needle was being advanced. No attempts were made at seeking any paresthesias. Safe injection practices and needle disposal techniques used. Medications properly checked for expiration dates. SDV (single dose vial) medications used. Description of the Procedure: Protocol guidelines were followed. The patient was placed in position over the fluoroscopy table. The target area was identified and the area prepped in the usual manner. Skin desensitized using vapocoolant spray. Skin & deeper tissues infiltrated with local anesthetic. Appropriate amount of time allowed to pass for local anesthetics to take effect. The procedure needles were then advanced to the target area. Proper needle placement secured. Negative aspiration confirmed. Solution injected in intermittent fashion, asking for systemic symptoms every 0.5cc of injectate. The needles were then removed and the area cleansed, making sure to leave some of the prepping solution back to take advantage of its long term bactericidal properties. Start Time: 0941 hrs. End Time: 0950 hrs. Materials:  Needle(s) Type: Epidural needle Gauge: 17G Length: 3.5-in Medication(s): We administered lactated ringers, iopamidol,  triamcinolone acetonide, lidocaine (PF), sodium chloride flush, ropivacaine (PF) 2 mg/mL (0.2%), iopamidol, fentaNYL, and midazolam. Please see chart orders for dosing details.  Imaging Guidance (Spinal):  Type of Imaging Technique: Fluoroscopy Guidance (Spinal) Indication(s): Assistance in needle guidance and placement for procedures requiring needle placement in or near specific anatomical locations not easily accessible without such assistance. Exposure Time: Please see nurses notes. Contrast: Before injecting any contrast, we confirmed that the patient did not  have an allergy to iodine, shellfish, or radiological contrast. Once satisfactory needle placement was completed at the desired level, radiological contrast was injected. Contrast injected under live fluoroscopy. No contrast complications. See chart for type and volume of contrast used. Fluoroscopic Guidance: I was personally present during the use of fluoroscopy. "Tunnel Vision Technique" used to obtain the best possible view of the target area. Parallax error corrected before commencing the procedure. "Direction-depth-direction" technique used to introduce the needle under continuous pulsed fluoroscopy. Once target was reached, antero-posterior, oblique, and lateral fluoroscopic projection used confirm needle placement in all planes. Images permanently stored in EMR. Interpretation: I personally interpreted the imaging intraoperatively. Adequate needle placement confirmed in multiple planes. Appropriate spread of contrast into desired area was observed. No evidence of afferent or efferent intravascular uptake. No intrathecal or subarachnoid spread observed. Permanent images saved into the patient's record.  Diagnostic Epidurogram:  Contrast: Before injecting any contrast, we confirmed that the patient did not have an allergy to iodine, shellfish, or radiological contrast. For accuracy purposes, contrast was injected under live fluoroscopy.  Study personally interpreted intraoparatively. Type: Non-ionic, water soluble, hypoallergenic, myelogram-compatible, radiological contrast used. Please see orders and nurses note for specific choice of contrast. Volume: Please see nurses note for injected volume.  Observations:  Spinal Alignment: Adequate       Vertebral body: Intact Lamina: Surgical changes observed Disc: Loss of disc hight Facet: Arthropathy        Hardware: Intravertebral disc spacing cages  Spread: Abnormal contrast spread. See below. Anterior: No flow of contrast Posterior: Defect identified. No contrast observed below the S1 nerve root, bilaterally. No contrast observed above the upper endplate of S1. Superior (cephalad): No flow of contrast above the upper plate of S1 Inferior (caudad): Defect identified. No contrast seen below the S1 foramen, bilaterally. Right lateral: Defect identified. Contrast is seen only between the upper plate of S1 and the S1 foramen but with no clear view of the nerve root. Left lateral: Defect identified. Contrast is observed only between the upper plate of S1 and the lower S1 foramina on the left side with adequate be silly sensation of the left S1 nerve root. Plica medialis dorsalis: Rightward displacement Nerve root sleeve: Defect identified. Only the left S1 nerve root can be observed. No we slowly cessation of any of the nerve roots above S1, including L5, bilaterally. Also no clear visualization of the right S1 or bilateral S2, S3, or is IV nerve roots. Epidural extravasation: None observed Intrathecal: No intrathecal spread identified Subarachnoid: No subarachnoid spread pattern observed Vascular: No evidence of afferent or efferent intravascular uptake  Impression: Technically successful epidurogram. 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  Antibiotic Prophylaxis:  Indication(s): None identified Antibiotic  given: None  Post-operative Assessment:  EBL: None Complications: No immediate post-treatment complications observed by team, or reported by patient. Note: The patient tolerated the entire procedure well. A repeat set of vitals were taken after the procedure and the patient was kept under observation following institutional policy, for this type of procedure. Post-procedural neurological assessment was performed, showing return to baseline, prior to discharge. The patient was provided with post-procedure discharge instructions, including a section on how to identify potential problems. Should any problems arise concerning this procedure, the patient was given instructions to immediately contact us, at any time, without hesitation. In any case, we plan to contact the patient by telephone for a follow-up status report regarding this interventional procedure.  Comments:  No additional relevant information.  Plan of Care  Disposition: Discharge home  Discharge Date & Time: 04/22/2016; 1023 hrs.  Physician-requested Follow-up:  Return in about 2 weeks (around 05/06/2016) for Post-Procedure evaluation.  Future Appointments Date Time Provider [place]  04/23/2016 2:45 PM [facility] None  04/24/2016 2:00 PM [facility] 1 [facility]  04/25/2016 12:00 PM Jerel Shepherd, LCSW [facility] None  05/09/2016 12:00 PM Jerel Shepherd, LCSW [facility] None  05/14/2016 8:15 AM Milinda Pointer, MD [facility] None  05/15/2016 4:15 PM Norman Clay, MD [facility] None   Medications ordered for procedure: Meds ordered this encounter  Medications  . fentaNYL (SUBLIMAZE) injection 25-50 mcg    Make sure Narcan is available in the pyxis when using this medication. In the event of respiratory depression (RR< 8/min): Titrate NARCAN (naloxone) in increments of 0.1 to 0.2 mg IV at 2-3 minute intervals, until desired degree of reversal.  . lactated ringers infusion 1,000 mL  . midazolam (VERSED) 5 MG/5ML  injection 1-2 mg    Make sure Flumazenil is available in the pyxis when using this medication. If oversedation occurs, administer 0.2 mg IV over 15 sec. If after 45 sec no response, administer 0.2 mg again over 1 min; may repeat at 1 min intervals; not to exceed 4 doses (1 mg)  . iopamidol (ISOVUE-M) 41 % intrathecal injection 10 mL  . triamcinolone acetonide (KENALOG-40) injection 40 mg  . lidocaine (PF) (XYLOCAINE) 1 % injection 10 mL  . sodium chloride flush (NS) 0.9 % injection 2 mL  . ropivacaine (PF) 2 mg/mL (0.2%) (NAROPIN) injection 2 mL  . ropivacaine (PF) 2 mg/mL (0.2%) (NAROPIN) 2 MG/ML injection    Willeen Cass L: cabinet override  . iopamidol (ISOVUE-M) 41 % intrathecal injection    Willeen Cass L: cabinet override  . lidocaine (PF) (XYLOCAINE) 1 % injection    Willeen Cass L: cabinet override  . sodium chloride 0.9 % injection    Willeen Cass L: cabinet override  . triamcinolone acetonide (KENALOG-40) 40 MG/ML injection    Willeen Cass L: cabinet override  . fentaNYL (SUBLIMAZE) 100 MCG/2ML injection    Willeen Cass L: cabinet override  . midazolam (VERSED) 5 MG/5ML injection    Willeen Cass L: cabinet override   Medications administered: We administered lactated ringers, iopamidol, triamcinolone acetonide, lidocaine (PF), sodium chloride flush, ropivacaine (PF) 2 mg/mL (0.2%), iopamidol, fentaNYL, and midazolam.  See the medical record for exact dosing, route, and time of administration.  Lab-work, Procedure(s), & Referral(s) Ordered: Orders Placed This Encounter  Procedures  . DG C-Arm 1-60 Min-No Report  . Discharge instructions  . Follow-up  . Informed Consent Details: Transcribe to consent form and obtain patient signature  . Provider attestation of informed consent for procedure/surgical case  . Verify informed consent   Imaging Ordered: No results found for this or any previous visit. New Prescriptions   No medications on file    Primary Care Physician: Boykin Nearing, MD Location: [facility] Note by: Kathlen Brunswick. Dossie Arbour, M.D, DABA, DABAPM, DABPM, DABIPP, FIPP Date: 04/22/2016; Time: 1:27 PM  Disclaimer:  Medicine is not an Chief Strategy Officer. The only guarantee in medicine is that nothing is guaranteed. It is important to note that the decision to proceed with this intervention was based on the information collected from the patient. The Data and conclusions were drawn from the patient's questionnaire, the interview, and the physical examination. Because the information was provided in large part by  the patient, it cannot be guaranteed that it has not been purposely or unconsciously manipulated. Every effort has been made to obtain as much relevant data as possible for this evaluation. It is important to note that the conclusions that lead to this procedure are derived in large part from the available data. Always take into account that the treatment will also be dependent on availability of resources and existing treatment guidelines, considered by other Pain Management Practitioners as being common knowledge and practice, at the time of the intervention. For Medico-Legal purposes, it is also important to point out that variation in procedural techniques and pharmacological choices are the acceptable norm. The indications, contraindications, technique, and results of the above procedure should only be interpreted and judged by a Board-Certified Interventional Pain Specialist with extensive familiarity and expertise in the same exact procedure and technique. Attempts at providing opinions without similar or greater experience and expertise than that of the treating physician will be considered as inappropriate and unethical, and shall result in a formal complaint to the state medical board and applicable specialty societies.  Instructions provided at this appointment: Patient Instructions  Steps to  Quit Smoking Smoking tobacco can be bad for your health. It can also affect almost every organ in your body. Smoking puts you and people around you at risk for many serious long-lasting (chronic) diseases. Quitting smoking is hard, but it is one of the best things that you can do for your health. It is never too late to quit. What are the benefits of quitting smoking? When you quit smoking, you lower your risk for getting serious diseases and conditions. They can include:  Lung cancer or lung disease.  Heart disease.  Stroke.  Heart attack.  Not being able to have children (infertility).  Weak bones (osteoporosis) and broken bones (fractures). If you have coughing, wheezing, and shortness of breath, those symptoms may get better when you quit. You may also get sick less often. If you are pregnant, quitting smoking can help to lower your chances of having a baby of low birth weight. What can I do to help me quit smoking? Talk with your doctor about what can help you quit smoking. Some things you can do (strategies) include:  Quitting smoking totally, instead of slowly cutting back how much you smoke over a period of time.  Going to in-person counseling. You are more likely to quit if you go to many counseling sessions.  Using resources and support systems, such as:  Online chats with a Social worker.  Phone quitlines.  Printed Furniture conservator/restorer.  Support groups or group counseling.  Text messaging programs.  Mobile phone apps or applications.  Taking medicines. Some of these medicines may have nicotine in them. If you are pregnant or breastfeeding, do not take any medicines to quit smoking unless your doctor says it is okay. Talk with your doctor about counseling or other things that can help you. Talk with your doctor about using more than one strategy at the same time, such as taking medicines while you are also going to in-person counseling. This can help make quitting  easier. What things can I do to make it easier to quit? Quitting smoking might feel very hard at first, but there is a lot that you can do to make it easier. Take these steps:  Talk to your family and friends. Ask them to support and encourage you.  Call phone quitlines, reach out to support groups, or work with a Social worker.  Ask people who smoke to not smoke around you.  Avoid places that make you want (trigger) to smoke, such as:  Bars.  Parties.  Smoke-break areas at work.  Spend time with people who do not smoke.  Lower the stress in your life. Stress can make you want to smoke. Try these things to help your stress:  Getting regular exercise.  Deep-breathing exercises.  Yoga.  Meditating.  Doing a body scan. To do this, close your eyes, focus on one area of your body at a time from head to toe, and notice which parts of your body are tense. Try to relax the muscles in those areas.  Download or buy apps on your mobile phone or tablet that can help you stick to your quit plan. There are many free apps, such as QuitGuide from the [employer] Office manager for Disease Control and Prevention). You can find more support from smokefree.gov and other websites. This information is not intended to replace advice given to you by your health care provider. Make sure you discuss any questions you have with your health care provider. Document Released: 12/22/2008 Document Revised: 10/24/2015 Document Reviewed: 07/12/2014 Elsevier Interactive Patient Education  2017 [place]. Pain Management Discharge Instructions  General Discharge Instructions :  If you need to reach your doctor call: Monday-Friday 8:00 am - 4:00 pm at [phone] or toll free [phone].  After clinic hours [phone] to have operator reach doctor.  Bring all of your medication bottles to all your appointments in the pain clinic.  To cancel or reschedule your appointment with Pain Management please remember to call  24 hours in advance to avoid a fee.  Refer to the educational materials which you have been given on: General Risks, I had my Procedure. Discharge Instructions, Post Sedation.  Post Procedure Instructions:  The drugs you were given will stay in your system until tomorrow, so for the next 24 hours you should not drive, make any legal decisions or drink any alcoholic beverages.  You may eat anything you prefer, but it is better to start with liquids then soups and crackers, and gradually work up to solid foods.  Please notify your doctor immediately if you have any unusual bleeding, trouble breathing or pain that is not related to your normal pain.  Depending on the type of procedure that was done, some parts of your body may feel week and/or numb.  This usually clears up by tonight or the next day.  Walk with the use of an assistive device or accompanied by an adult for the 24 hours.  You may use ice on the affected area for the first 24 hours.  Put ice in a Ziploc bag and cover with a towel and place against area 15 minutes on 15 minutes off.  You may switch to heat after 24 hours. Epidural Steroid Injection An epidural steroid injection is a shot of steroid medicine and numbing medicine that is given into the space between the spinal cord and the bones in your back (epidural space). The shot helps relieve pain caused by an irritated or swollen nerve root. The amount of pain relief you get from the injection depends on what is causing the nerve to be swollen and irritated, and how long your pain lasts. You are more likely to benefit from this injection if your pain is strong and comes on suddenly rather than if you have had pain for a long time. Tell a health care provider about:  Any allergies you  have.  All medicines you are taking, including vitamins, herbs, eye drops, creams, and over-the-counter medicines.  Any problems you or family members have had with anesthetic medicines.  Any  blood disorders you have.  Any surgeries you have had.  Any medical conditions you have.  Whether you are pregnant or may be pregnant. What are the risks? Generally, this is a safe procedure. However, problems may occur, including:  Headache.  Bleeding.  Infection.  Allergic reaction to medicines.  Damage to your nerves. What happens before the procedure? Staying hydrated  Follow instructions from your health care provider about hydration, which may include:  Up to 2 hours before the procedure - you may continue to drink clear liquids, such as water, clear fruit juice, black coffee, and plain tea. Eating and drinking restrictions  Follow instructions from your health care provider about eating and drinking, which may include:  8 hours before the procedure - stop eating heavy meals or foods such as meat, fried foods, or fatty foods.  6 hours before the procedure - stop eating light meals or foods, such as toast or cereal.  6 hours before the procedure - stop drinking milk or drinks that contain milk.  2 hours before the procedure - stop drinking clear liquids. Medicine  You may be given medicines to lower anxiety.  Ask your health care provider about:  Changing or stopping your regular medicines. This is especially important if you are taking diabetes medicines or blood thinners.  Taking medicines such as aspirin and ibuprofen. These medicines can thin your blood. Do not take these medicines before your procedure if your health care provider instructs you not to. General instructions  Plan to have someone take you home from the hospital or clinic. What happens during the procedure?  You may receive a medicine to help you relax (sedative).  You will be asked to lie on your abdomen.  The injection site will be cleaned.  A numbing medicine (local anesthetic) will be used to numb the injection site.  A needle will be inserted through your skin into the epidural  space. You may feel some discomfort when this happens. An X-ray machine will be used to make sure the needle is put as close as possible to the affected nerve.  A steroid medicine and a local anesthetic will be injected into the epidural space.  The needle will be removed.  A bandage (dressing) will be put over the injection site. What happens after the procedure?  Your blood pressure, heart rate, breathing rate, and blood oxygen level will be monitored until the medicines you were given have worn off.  Your arm or leg may feel weak or numb for a few hours.  The injection site may feel sore.  Do not drive for 24 hours if you received a sedative. This information is not intended to replace advice given to you by your health care provider. Make sure you discuss any questions you have with your health care provider. Document Released: 06/04/2007 Document Revised: 08/09/2015 Document Reviewed: 06/13/2015 Elsevier Interactive Patient Education  2017 [employer].

## 2016-04-23 ENCOUNTER — Telehealth: Payer: Self-pay | Admitting: *Deleted

## 2016-04-23 MED FILL — traMADol HCL 50 MG TABS: 50 | 30 days supply | Qty: 240 | Fill #0

## 2016-04-23 NOTE — Telephone Encounter (Signed)
patient asleep but family member states he is ding fine and I instructed him to call if he has any problems or concerns.

## 2016-04-24 ENCOUNTER — Ambulatory Visit (HOSPITAL_COMMUNITY)
Admission: RE | Admit: 2016-04-24 | Discharge: 2016-04-24 | Disposition: A | Discharge status: Home / Self Care | Payer: Self-pay | Source: Ambulatory Visit | Attending: Pain Medicine | Admitting: Pain Medicine

## 2016-04-24 DIAGNOSIS — G8929 Other chronic pain: Secondary | ICD-10-CM | POA: Insufficient documentation

## 2016-04-24 DIAGNOSIS — M5416 Radiculopathy, lumbar region: Secondary | ICD-10-CM | POA: Insufficient documentation

## 2016-04-24 DIAGNOSIS — M5441 Lumbago with sciatica, right side: Secondary | ICD-10-CM | POA: Insufficient documentation

## 2016-04-24 DIAGNOSIS — M5442 Lumbago with sciatica, left side: Secondary | ICD-10-CM | POA: Insufficient documentation

## 2016-04-24 DIAGNOSIS — M961 Postlaminectomy syndrome, not elsewhere classified: Secondary | ICD-10-CM | POA: Insufficient documentation

## 2016-04-24 MED ORDER — GADOBENATE DIMEGLUMINE 529 MG/ML IV SOLN
20.0000 mL | Freq: Once | INTRAVENOUS | Status: AC | PRN
  Administered 2016-04-24: 20 mL via INTRAVENOUS

## 2016-04-25 ENCOUNTER — Ambulatory Visit (HOSPITAL_COMMUNITY): Payer: Self-pay | Admitting: Clinical

## 2016-04-30 MED FILL — GABAPENTIN 400 MG CAPSULE: 400 | 30 days supply | Qty: 180 | Fill #2

## 2016-05-06 MED FILL — METOPROLOL TARTRATE 50 MG T: 50 | 30 days supply | Qty: 60 | Fill #2

## 2016-05-09 ENCOUNTER — Encounter (HOSPITAL_COMMUNITY): Payer: Self-pay | Admitting: Clinical

## 2016-05-09 ENCOUNTER — Ambulatory Visit (INDEPENDENT_AMBULATORY_CARE_PROVIDER_SITE_OTHER): Payer: Self-pay | Admitting: Clinical

## 2016-05-09 DIAGNOSIS — F3130 Bipolar disorder, current episode depressed, mild or moderate severity, unspecified: Secondary | ICD-10-CM

## 2016-05-09 DIAGNOSIS — F1021 Alcohol dependence, in remission: Secondary | ICD-10-CM

## 2016-05-09 DIAGNOSIS — F319 Bipolar disorder, unspecified: Secondary | ICD-10-CM

## 2016-05-09 NOTE — Progress Notes (Signed)
THERAPIST PROGRESS NOTE  Session Time: 12:08 - 12:55  Participation Level: Active  Behavioral Response: CasualAlertDepressed   Type of Therapy: Individual therapy  Treatment Goals addressed: Improve psychiatric symptoms, elevate mood, Improve unhelpful thought patterns,  Mood stabilization (improved mood, decreased anxiety, decreased depression), stress management skills (decreased restlessness, decreased anxiety), learn about diagnosis, healthy coping skills  Interventions: motivational interviewing, cbt, grounding and mindfulness techniques,   Summary: Mark [name] is [name] 51 y.o. 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 Mark [name] discussed his psychiatric symptoms, his current life events, and his homework. Mark [name]  [name] that his depression was [name] 9 out of 10 (ten being the worse. Clinician asked open ended questions and [name] that he was experiencing [name] lot physical pain. He stated that he is not suicidal. He [name] he is looking forward to his [name]'s upcoming birth. Clinician asked if the was anywhere on his body that did not hurt. Mark [name] said his face did not hurt. Clinician asked him to focus on his face and talk about how it did feel. Clinician asked open ended questions and Mark [name] [name] that when he focused on what was feeling good he was less focused on the pain he feels. He [name] it did not make the pain go away, but changing his focus did help briefly. Client and clinician discussed how this would be true for depression too. Clinician asked Mark [name], and his interactions with his family. He [name] that when he focused on these things his pain and depression were still there but not in the forefront of his mind. Mark [name] that this focus was not always possible. Clinician agreed but  explained that it was available if he chose it. Clinician asked open ended questions and Mark [name] [name] that he noticed sometimes he dwells in his difficulty, but when with family or interacting the pain and depression get pushed out of the forefront of his mind. Client and clinician discussed grounding and mindfulness techniques. [name] agreed to continue to practice his techniques until next session    Plan: Return in 2-3 weeks.  Diagnosis: Axis I: Bipolar I disorder, Alcohol use disorder, severe in early remission    Mark Bady A, LCSW 05/09/2016

## 2016-05-14 ENCOUNTER — Telehealth: Payer: Self-pay

## 2016-05-14 ENCOUNTER — Telehealth: Payer: Self-pay | Admitting: Pain Medicine

## 2016-05-14 ENCOUNTER — Ambulatory Visit: Payer: Self-pay | Attending: Pain Medicine | Admitting: Pain Medicine

## 2016-05-14 ENCOUNTER — Encounter: Payer: Self-pay | Admitting: Pain Medicine

## 2016-05-14 VITALS — BP 150/85 | HR 63 | Temp 97.8°F | Resp 18 | Ht 76.0 in | Wt 225.0 lb

## 2016-05-14 DIAGNOSIS — F1190 Opioid use, unspecified, uncomplicated: Secondary | ICD-10-CM

## 2016-05-14 DIAGNOSIS — M5442 Lumbago with sciatica, left side: Secondary | ICD-10-CM | POA: Insufficient documentation

## 2016-05-14 DIAGNOSIS — F17210 Nicotine dependence, cigarettes, uncomplicated: Secondary | ICD-10-CM | POA: Insufficient documentation

## 2016-05-14 DIAGNOSIS — F3130 Bipolar disorder, current episode depressed, mild or moderate severity, unspecified: Secondary | ICD-10-CM

## 2016-05-14 DIAGNOSIS — Z79891 Long term (current) use of opiate analgesic: Secondary | ICD-10-CM

## 2016-05-14 DIAGNOSIS — M5416 Radiculopathy, lumbar region: Secondary | ICD-10-CM

## 2016-05-14 DIAGNOSIS — Z5181 Encounter for therapeutic drug level monitoring: Secondary | ICD-10-CM | POA: Insufficient documentation

## 2016-05-14 DIAGNOSIS — Z79899 Other long term (current) drug therapy: Secondary | ICD-10-CM | POA: Insufficient documentation

## 2016-05-14 DIAGNOSIS — M7918 Myalgia, other site: Secondary | ICD-10-CM

## 2016-05-14 DIAGNOSIS — G629 Polyneuropathy, unspecified: Secondary | ICD-10-CM

## 2016-05-14 DIAGNOSIS — F319 Bipolar disorder, unspecified: Secondary | ICD-10-CM

## 2016-05-14 DIAGNOSIS — M79672 Pain in left foot: Secondary | ICD-10-CM | POA: Insufficient documentation

## 2016-05-14 DIAGNOSIS — M25511 Pain in right shoulder: Secondary | ICD-10-CM | POA: Insufficient documentation

## 2016-05-14 DIAGNOSIS — M5441 Lumbago with sciatica, right side: Secondary | ICD-10-CM | POA: Insufficient documentation

## 2016-05-14 DIAGNOSIS — G8929 Other chronic pain: Secondary | ICD-10-CM

## 2016-05-14 DIAGNOSIS — M79671 Pain in right foot: Secondary | ICD-10-CM | POA: Insufficient documentation

## 2016-05-14 DIAGNOSIS — M791 Myalgia: Secondary | ICD-10-CM | POA: Insufficient documentation

## 2016-05-14 DIAGNOSIS — M542 Cervicalgia: Secondary | ICD-10-CM | POA: Insufficient documentation

## 2016-05-14 DIAGNOSIS — G894 Chronic pain syndrome: Secondary | ICD-10-CM

## 2016-05-14 HISTORY — DX: Opioid use, unspecified, uncomplicated: F11.90

## 2016-05-14 MED ORDER — TRAMADOL HCL 50 MG PO TABS: 50.0000 mg | ORAL_TABLET | Freq: Four times a day (QID) | ORAL | 0 refills | Status: DC

## 2016-05-14 MED ORDER — GABAPENTIN 800 MG PO TABS: 800.0000 mg | ORAL_TABLET | Freq: Four times a day (QID) | ORAL | 0 refills | Status: DC

## 2016-05-14 MED ORDER — CYCLOBENZAPRINE HCL 10 MG PO TABS: 10.0000 mg | ORAL_TABLET | Freq: Every evening | ORAL | 0 refills | Status: DC | PRN

## 2016-05-14 NOTE — Telephone Encounter (Signed)
Spoke with Dianna at the pharmacy.  Informed her to cancel all previous refills on the Gabapentin, Flexeril and Tramadol.

## 2016-05-14 NOTE — Patient Instructions (Addendum)
You have been given a script for tramadol today.  You have medications that were e scribed to your pharmacy for gabapentin and flexeril.  You are scheduled for a procedure with sedation.  You have been instructed on the pre procedure instructions.  Do not eat or drink for 8 hours, bring a driver and take your blood pressure medication the morning of the procedure.  A urind drug screen has been done today.     Steps to Quit Smoking Smoking tobacco can be harmful to your health and can affect almost every organ in your body. Smoking puts you, and those around you, at risk for developing many serious chronic diseases. Quitting smoking is difficult, but it is one of the best things that you can do for your health. It is never too late to quit. What are the benefits of quitting smoking? When you quit smoking, you lower your risk of developing serious diseases and conditions, such as:  Lung cancer or lung disease, such as COPD.  Heart disease.  Stroke.  Heart attack.  Infertility.  Osteoporosis and bone fractures. Additionally, symptoms such as coughing, wheezing, and shortness of breath may get better when you quit. You may also find that you get sick less often because your body is stronger at fighting off colds and infections. If you are pregnant, quitting smoking can help to reduce your chances of having a baby of low birth weight. How do I get ready to quit? When you decide to quit smoking, create a plan to make sure that you are successful. Before you quit:  Pick a date to quit. Set a date within the next two weeks to give you time to prepare.  Write down the reasons why you are quitting. Keep this list in places where you will see it often, such as on your bathroom mirror or in your car or wallet.  Identify the people, places, things, and activities that make you want to smoke (triggers) and avoid them. Make sure to take these actions:  Throw away all cigarettes at home, at work, and  in your car.  Throw away smoking accessories, such as Scientist, research (medical).  Clean your car and make sure to empty the ashtray.  Clean your home, including curtains and carpets.  Tell your family, friends, and coworkers that you are quitting. Support from your loved ones can make quitting easier.  Talk with your health care provider about your options for quitting smoking.  Find out what treatment options are covered by your health insurance. What strategies can I use to quit smoking? Talk with your healthcare provider about different strategies to quit smoking. Some strategies include:  Quitting smoking altogether instead of gradually lessening how much you smoke over a period of time. Research shows that quitting "cold [place]" is more successful than gradually quitting.  Attending in-person counseling to help you build problem-solving skills. You are more likely to have success in quitting if you attend several counseling sessions. Even short sessions of 10 minutes can be effective.  Finding resources and support systems that can help you to quit smoking and remain smoke-free after you quit. These resources are most helpful when you use them often. They can include:  Online chats with a Social worker.  Telephone quitlines.  Printed Furniture conservator/restorer.  Support groups or group counseling.  Text messaging programs.  Mobile phone applications.  Taking medicines to help you quit smoking. (If you are pregnant or breastfeeding, talk with your health care provider first.)  Some medicines contain nicotine and some do not. Both types of medicines help with cravings, but the medicines that include nicotine help to relieve withdrawal symptoms. Your health care provider may recommend:  Nicotine patches, gum, or lozenges.  Nicotine inhalers or sprays.  Non-nicotine medicine that is taken by mouth. Talk with your health care provider about combining strategies, such as taking medicines  while you are also receiving in-person counseling. Using these two strategies together makes you more likely to succeed in quitting than if you used either strategy on its own. If you are pregnant or breastfeeding, talk with your health care provider about finding counseling or other support strategies to quit smoking. Do not take medicine to help you quit smoking unless told to do so by your health care provider. What things can I do to make it easier to quit? Quitting smoking might feel overwhelming at first, but there is a lot that you can do to make it easier. Take these important actions:  Reach out to your family and friends and ask that they support and encourage you during this time. Call telephone quitlines, reach out to support groups, or work with a counselor for support.  Ask people who smoke to avoid smoking around you.  Avoid places that trigger you to smoke, such as bars, parties, or smoke-break areas at work.  Spend time around people who do not smoke.  Lessen stress in your life, because stress can be a smoking trigger for some people. To lessen stress, try:  Exercising regularly.  Deep-breathing exercises.  Yoga.  Meditating.  Performing a body scan. This involves closing your eyes, scanning your body from head to toe, and noticing which parts of your body are particularly tense. Purposefully relax the muscles in those areas.  Download or purchase mobile phone or tablet apps (applications) that can help you stick to your quit plan by providing reminders, tips, and encouragement. There are many free apps, such as QuitGuide from the [employer] Office manager for Disease Control and Prevention). You can find other support for quitting smoking (smoking cessation) through smokefree.gov and other websites. How will I feel when I quit smoking? Within the first 24 hours of quitting smoking, you may start to feel some withdrawal symptoms. These symptoms are usually most noticeable 2-3 days  after quitting, but they usually do not last beyond 2-3 weeks. Changes or symptoms that you might experience include:  Mood swings.  Restlessness, anxiety, or irritation.  Difficulty concentrating.  Dizziness.  Strong cravings for sugary foods in addition to nicotine.  Mild weight gain.  Constipation.  Nausea.  Coughing or a sore throat.  Changes in how your medicines work in your body.  A depressed mood.  Difficulty sleeping (insomnia). After the first 2-3 weeks of quitting, you may start to notice more positive results, such as:  Improved sense of smell and taste.  Decreased coughing and sore throat.  Slower heart rate.  Lower blood pressure.  Clearer skin.  The ability to breathe more easily.  Fewer sick days. Quitting smoking is very challenging for most people. Do not get discouraged if you are not successful the first time. Some people need to make many attempts to quit before they achieve long-term success. Do your best to stick to your quit plan, and talk with your health care provider if you have any questions or concerns. This information is not intended to replace advice given to you by your health care provider. Make sure you discuss any questions you  have with your health care provider. Document Released: 02/19/2001 Document Revised: 10/24/2015 Document Reviewed: 07/12/2014 Elsevier Interactive Patient Education  2017 Elsevier Inc.  Pain Score  Introduction: The pain score used by this practice is the Verbal Numerical Rating Scale (VNRS-11). This is an 11-point scale. It is for adults and children 10 years or older. There are significant differences in how the pain score is reported, used, and applied. Forget everything you learned in the past and learn this scoring system.  General Information: The scale should reflect your current level of pain. Unless you are specifically asked for the level of your worst pain, or your average pain. If you are asked  for one of these two, then it should be understood that it is over the past 24 hours.  Basic Activities of Daily Living (ADL): Personal hygiene, dressing, eating, transferring, and using restroom.  Instructions: Most patients tend to report their level of pain as a combination of two factors, their physical pain and their psychosocial pain. This last one is also known as "suffering" and it is reflection of how physical pain affects you socially and psychologically. From now on, report them separately. From this point on, when asked to report your pain level, report only your physical pain. Use the following table for reference.  Pain Clinic Pain Levels (0-5/10)  Pain Level Score Description  No Pain 0   Mild pain 1 Nagging, annoying, but does not interfere with basic activities of daily living (ADL). Patients are able to eat, bathe, get dressed, toileting (being able to get on and off the toilet and perform personal hygiene functions), transfer (move in and out of bed or a chair without assistance), and maintain continence (able to control bladder and bowel functions). Blood pressure and heart rate are unaffected. A normal heart rate for a healthy adult ranges from 60 to 100 bpm (beats per minute).   Mild to moderate pain 2 Noticeable and distracting. Impossible to hide from other people. More frequent flare-ups. Still possible to adapt and function close to normal. It can be very annoying and may have occasional stronger flare-ups. With discipline, patients may get used to it and adapt.   Moderate pain 3 Interferes significantly with activities of daily living (ADL). It becomes difficult to feed, bathe, get dressed, get on and off the toilet or to perform personal hygiene functions. Difficult to get in and out of bed or a chair without assistance. Very distracting. With effort, it can be ignored when deeply involved in activities.   Moderately severe pain 4 Impossible to ignore for more than a few  minutes. With effort, patients may still be able to manage work or participate in some social activities. Very difficult to concentrate. Signs of autonomic nervous system discharge are evident: dilated pupils (mydriasis); mild sweating (diaphoresis); sleep interference. Heart rate becomes elevated (>115 bpm). Diastolic blood pressure (lower number) rises above 100 mmHg. Patients find relief in laying down and not moving.   Severe pain 5 Intense and extremely unpleasant. Associated with frowning face and frequent crying. Pain overwhelms the senses.  Ability to do any activity or maintain social relationships becomes significantly limited. Conversation becomes difficult. Pacing back and forth is common, as getting into a comfortable position is nearly impossible. Pain wakes you up from deep sleep. Physical signs will be obvious: pupillary dilation; increased sweating; goosebumps; brisk reflexes; cold, clammy hands and feet; nausea, vomiting or dry heaves; loss of appetite; significant sleep disturbance with inability to fall asleep or  to remain asleep. When persistent, significant weight loss is observed due to the complete loss of appetite and sleep deprivation.  Blood pressure and heart rate becomes significantly elevated. Caution: If elevated blood pressure triggers a pounding headache associated with blurred vision, then the patient should immediately seek attention at an urgent or emergency care unit, as these may be signs of an impending stroke.    Emergency Department Pain Levels (6-10/10)  Emergency Room Pain 6 Severely limiting. Requires emergency care and should not be seen or managed at an outpatient pain management facility. Communication becomes difficult and requires great effort. Assistance to reach the emergency department may be required. Facial flushing and profuse sweating along with potentially dangerous increases in heart rate and blood pressure will be evident.   Distressing pain 7  Self-care is very difficult. Assistance is required to transport, or use restroom. Assistance to reach the emergency department will be required. Tasks requiring coordination, such as bathing and getting dressed become very difficult.   Disabling pain 8 Self-care is no longer possible. At this level, pain is disabling. The individual is unable to do even the most "basic" activities such as walking, eating, bathing, dressing, transferring to a bed, or toileting. Fine motor skills are lost. It is difficult to think clearly.   Incapacitating pain 9 Pain becomes incapacitating. Thought processing is no longer possible. Difficult to remember your own name. Control of movement and coordination are lost.   The worst pain imaginable 10 At this level, most patients pass out from pain. When this level is reached, collapse of the autonomic nervous system occurs, leading to a sudden drop in blood pressure and heart rate. This in turn results in a temporary and dramatic drop in blood flow to the brain, leading to a loss of consciousness. Fainting is one of the body's self defense mechanisms. Passing out puts the brain in a calmed state and causes it to shut down for a while, in order to begin the healing process.    Summary: 1. Refer to this scale when providing [place] with your pain level. 2. Be accurate and careful when reporting your pain level. This will help with your care. 3. Over-reporting your pain level will lead to loss of credibility. 4. Even a level of 1/10 means that there is pain and will be treated at our facility. 5. High, inaccurate reporting will be documented as "Symptom Exaggeration", leading to loss of credibility and suspicions of possible secondary gains such as obtaining more narcotics, or wanting to appear disabled, for fraudulent reasons. 6. Only pain levels of 5 or below will be seen at our facility. 7. Pain levels of 6 and above will be sent to the Emergency Department and the appointment  cancelled. _____________________________________________________________________________________________  Preparing for Procedure with Sedation Instructions: . Oral Intake: Do not eat or drink anything for at least 8 hours prior to your procedure. . Transportation: Public transportation is not allowed. Bring an adult driver. The driver must be physically present in our waiting room before any procedure can be started. Marland Kitchen Physical Assistance: Bring an adult capable of physically assisting you, in the event you need help. . Blood Pressure Medicine: Take your blood pressure medicine with a sip of water the morning of the procedure. . Insulin: Take only  of your normal insulin dose. . Preventing infections: Shower with an antibacterial soap the morning of your procedure. . Build-up your immune system: Take 1000 mg of Vitamin C with every meal (3 times a day)  the day prior to your procedure. . Pregnancy: If you are pregnant, call and cancel the procedure. . Sickness: If you have a cold, fever, or any active infections, call and cancel the procedure. . Arrival: You must be in the facility at least 30 minutes prior to your scheduled procedure. . Children: Do not bring children with you. . Dress appropriately: Bring dark clothing that you would not mind if they get stained. . Valuables: Do not bring any jewelry or valuables. Procedure appointments are reserved for interventional treatments only. Marland Kitchen No Prescription Refills. . No medication changes will be discussed during procedure appointments. No disability issues will be discussed.Lumbar Sympathetic Block Patient Information  Description: The lumbar plexus is a group of nerves that are part of the sympathetic nervous system.  These nerves supply organs in the pelvis and legs.  Lumbar sympathetic blocks are utilized for the diagnosis and treatment of painful conditions in these areas.   The lumbar plexus is located on both sides of the aorta at  approximately the level of the second lumbar vertebral body.  The block will be performed with you lying on your abdomen with a pillow underneath.  Using direct x-ray guidance,   The plexus will be located on both sides of the spine.  Numbing medicine will be used to deaden the skin prior to needle insertion.  In most cases, a small amount of sedation can be give by IV prior to the numbing medicine.  One or two small needles will be placed near the plexus and local anesthetic will be injected.  This may make your leg(s) feel warm.  The Entire block usually lasts about 15-25 minutes.  Conditions which may be treated by lumbar sympathetic block:  Reflex sympathetic dystrophy Phantom limb pain Peripheral neuropathy Peripheral vascular disease ( inadequate blood flow ) Cancer pain of pelvis, leg and kidney  Preparation for the injection:  Do note eat any solid food or diary products within 8 hours of your appointment. You may drink clear liquids up to 3 hours before appointment.  Clear liquids include water, black coffee, juice or soda.  No milk or cream please. You may take your regular medication, including pain medications, with a sip of water before you appointment.  Diabetics should hold regular insulin ( if taken separately ) and take 1/2 NPH dose the morning of the procedure .  Carry some sugar containing items with you to your appointment. A driver must accompany you and be prepared to drive you home after your procedure. Bring all your current medication with you. An IV may be inserted and sedation may be given at the discretion of the physician.  A blood pressure cuff, EKG and other monitors will often be applied during the procedure.  Some patients may need to have extra oxygen administered for a short period. You will be asked to provide medical information, including your allergies and medications, prior to the procedure.  We must know immediately if your taking blood thinners (like  Coumadin/Warfarin) or if you are allergic to IV iodine contrast (dye).  We must know if you could possibly be pregnant.  Possible side-effects  Bleeding from needle site or deeper Infection (rare, can require surgery) Nerve injury (rare) Numbness & tingling (temporary) Collapsed lung (rare) Spinal headache (a headache worse with upright posture) Light-headedness (temporary) Pain at injection site (several days) Decreased blood pressure (temporary) Weakness in legs (temporary) Seizure or other drug reaction (rare)  Call if you experience:  Fever/chills associated with  headache or increased back/ neck pain Headache worsened by an upright position New onset weakness or numbness of an extremity below the injection site Hives or difficulty breathing ( go to the emergency room) Inflammation or drainage at the injections site(s) New symptoms which are concerning to you  Please note:  If effective, we will often do a series of 2-3 injections spaced 3-6 weeks apart to maximally decrease your pain.  If initial series is effective, you may be a candidate for a more permanent block of the lumbar sympathetic plexus.  If you have any questions please call [phone] [facility] .

## 2016-05-14 NOTE — Progress Notes (Signed)
Nursing Pain Medication Assessment:  Safety precautions to be maintained throughout the outpatient stay will include: orient to surroundings, keep bed in low position, maintain call bell within reach at all times, provide assistance with transfer out of bed and ambulation.  Medication Inspection Compliance: Mark [name] did not comply with our request to bring his pills to be counted. He was reminded that bringing the medication bottles, even when empty, is a requirement. Pill/Patch Count: None available to be counted. Bottle Appearance: No container available. Did not bring bottle(s) to appointment. Medication: None brought in. Filled Date: N/A Last Medication intake:  Yesterday  Reminded to bring pill bottles even if empty to all appointments except procedure appointments.

## 2016-05-14 NOTE — Progress Notes (Addendum)
Patient's Name: Mark [name]  MRN: [id number]  Referring Provider: Boykin Nearing, MD  DOB: 12/30/1965  PCP: Boykin Nearing, MD  DOS: 05/14/2016  Note by: Kathlen Brunswick. Dossie Arbour, MD  Service setting: Ambulatory outpatient  Specialty: Interventional Pain Management  Location: [facility] (AMB) Pain Management Facility    Patient type: Established   Primary Reason(s) for Visit: Encounter for prescription drug management & post-procedure evaluation of chronic illness with mild to moderate exacerbation(Level of risk: moderate) CC: Foot Pain (bilateral); Neck Pain; Back Pain; Leg Pain; and Shoulder Pain  HPI  [name] is a 51 y.o. year old, male patient, who comes today for a post-procedure evaluation and medication management. 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 ([place]); Alcohol intoxication ([place]); Chronic low back pain (Location of Secondary source of pain) (Bilateral) (R>L); Chronic pain syndrome; Paroxysmal VT ([place]); Opioid dependence ([place]); Long Q-T syndrome; Hypokalemia; Bipolar depression ([place]); Neuropathy, Peripheral (Feet) ([name]) (Location of Primary Source of Pain) (Bilateral) (R>L); Chronic right shoulder pain(5)(R); Chronic radicular low back pain (Location of Tertiary source of pain) (Right) (to calf); Closed fracture of 5th metacarpal; Chronic fatigue; Vitamin D insufficiency; Poor dentition; Chronic maxillary sinusitis; Tender prostate; Erectile dysfunction; Chronic anxiety; GERD (gastroesophageal reflux disease); Suicidal ideation; Bipolar affective disorder, currently depressed, moderate ([place]); Non-sustained ventricular tachycardia ([place]); GAD (generalized anxiety disorder); Alcohol use disorder, severe, in early remission ([place]); Long term current use of opiate analgesic; Long term prescription opiate use; History of fusion of cervical spine; Chronic neck pain(4)(B)(R>L); Cervical fusion  syndrome; Failed back surgical syndrome; Chronic pain of both knees(6)(B)(R>L); Major depressive disorder, recurrent episode, moderate ([place]); Musculoskeletal pain; 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 His primarily concern today is the Foot Pain (bilateral); Neck Pain; Back Pain; Leg Pain; and Shoulder Pain  Pain Assessment: Self-Reported Pain Score: 4 /10 Clinically the patient looks like a 2/10 Reported level is inconsistent with clinical observations. Information on the proper use of the pain scale provided to the patient today Pain Type: Chronic pain Pain Location: Foot Pain Orientation: Right, Left Pain Descriptors / Indicators: Numbness, Sharp Pain Frequency: Constant  [name] was last seen on 04/22/2016 for a procedure. During today's appointment we reviewed [name] post-procedure results, as well as his outpatient medication regimen.   Further details on both, my assessment(s), as well as the proposed treatment plan, please see below.  Controlled Substance Pharmacotherapy Assessment REMS (Risk Evaluation and Mitigation Strategy)  Analgesic: Tramadol 100 mg 4 times a day (400 mg/day of tramadol) Prescribed on 04/16/16, #240, to last until 05/16/16. Took them all in 2 weeks. Claims the pharmacy instructions say 2 tabs q 4 hours. Did not bring pills to be counted or bottle to be examined.  MME/day: 40 mg/day.  Zenovia Jarred, RN  05/14/2016 12:39 PM  Signed Nursing Pain Medication Assessment:  Safety precautions to be maintained throughout the outpatient stay will include: orient to surroundings, keep bed in low position, maintain call bell within reach at all times, provide assistance with transfer out of bed and ambulation.  Medication Inspection Compliance: Mr. Tandy did not comply with our request to bring his pills to be counted. He was reminded that bringing the medication bottles, even when empty, is a  requirement. Pill/Patch Count: None available to be counted. Bottle Appearance: No container available. Did not bring bottle(s) to appointment. Medication: None brought in. Filled Date: N/A Last Medication intake:  Yesterday  Reminded  to bring pill bottles even if empty to all appointments except procedure appointments.   Pharmacokinetics: Liberation and absorption (onset of action): WNL Distribution (time to peak effect): WNL Metabolism and excretion (duration of action): WNL         Pharmacodynamics: Desired effects: Analgesia: [name] reports >50% benefit. Functional ability: Patient reports that medication allows him to accomplish basic ADLs Clinically meaningful improvement in function (CMIF): Sustained CMIF goals met Perceived effectiveness: Described as relatively effective, allowing for increase in activities of daily living (ADL) Undesirable effects: Side-effects or Adverse reactions: None reported Monitoring: [place] PMP: Online review of the past 64-monthperiod conducted. Compliant with practice rules and regulations List of all UDS test(s) done:  Lab Results  Component Value Date   SUMMARY FINAL 03/18/2016   Last UDS on record: No results found for: TOXASSSELUR UDS interpretation: Compliant          Medication Assessment Form: Reviewed. Patient indicates being compliant with therapy Treatment compliance: Compliant Risk Assessment Profile: Aberrant behavior: See prior evaluations. None observed or detected today Comorbid factors increasing risk of overdose: See prior notes. No additional risks detected today Risk of substance use disorder (SUD): Low Opioid Risk Tool (ORT) Total Score:    Interpretation Table:  Score <3 = Low Risk for SUD  Score between 4-7 = Moderate Risk for SUD  Score >8 = High Risk for Opioid Abuse   Risk Mitigation Strategies:  Patient Counseling: Covered Patient-Prescriber Agreement (PPA): Present and active  Notification to other  healthcare providers: Done  Pharmacologic Plan: No change in therapy, at this time  Post-Procedure Assessment  04/22/2016 Procedure: Diagnostic caudal epidural steroid injection + epidurogram Post-procedure pain score: 3/10 The patient indicated initially having a pain score of 4/10 prior to the procedure. After the procedure he indicated there was a 3/10. Influential Factors: BMI: 27.39 kg/m Intra-procedural challenges: None observed Assessment challenges: Results reported today are inconsistent with those reported on procedure day, immediately before discharge.         Post-procedural side-effects, adverse reactions, or complications: None reported Reported issues: None  Sedation: Please see nurses note. When no sedatives are used, the analgesic levels obtained are directly associated to the effectiveness of the local anesthetics. However, when sedation is provided, the level of analgesia obtained during the initial 1 hour following the intervention, is believed to be the result of a combination of factors. These factors may include, but are not limited to: 1. The effectiveness of the local anesthetics used. 2. The effects of the analgesic(s) and/or anxiolytic(s) used. 3. The degree of discomfort experienced by the patient at the time of the procedure. 4. The patients ability and reliability in recalling and recording the events. 5. The presence and influence of possible secondary gains and/or psychosocial factors. Reported result: Relief experienced during the 1st hour after the procedure: 50 % (buttocks and low back was  some better, but legs and feet werent.) (Ultra-Short Term Relief) Interpretative annotation: Analgesia during this period is likely to be Local Anesthetic and/or IV Sedative (Analgesic/Anxiolitic) related.          Effects of local anesthetic: The analgesic effects attained during this period are directly associated to the localized infiltration of local anesthetics and  therefore cary significant diagnostic value as to the etiological location, or anatomical origin, of the pain. Expected duration of relief is directly dependent on the pharmacodynamics of the local anesthetic used. Long-acting (4-6 hours) anesthetics used.  Reported result: Relief during the next 4 to 6  hour after the procedure: 50 % (same as above) (Short-Term Relief) Interpretative annotation: Complete relief would suggest area to be the source of the pain.          Long-term benefit: Defined as the period of time past the expected duration of local anesthetics. With the possible exception of prolonged sympathetic blockade from the local anesthetics, benefits during this period are typically attributed to, or associated with, other factors such as analgesic sensory neuropraxia, antiinflammatory effects, or beneficial biochemical changes provided by agents other than the local anesthetics Reported result: Extended relief following procedure: 35 % (lasting 5 days.  No relief in legs and feet) (Long-Term Relief) Interpretative annotation: Partial relief. Possible incomplete therapeutic success.          Current benefits: Defined as persistent relief that continues at this point in time.   Reported results: Treated area: 0 %       Interpretative annotation: Recurrance of symptoms. This would suggest persistent aggravating factors  Interpretation: Results would suggest a successful diagnostic intervention.          Laboratory Chemistry  Inflammation Markers Lab Results  Component Value Date   ESRSEDRATE 2 03/18/2016   CRP 0.9 03/18/2016   Renal Function Markers Lab Results  Component Value Date   BUN 12 03/18/2016   CREATININE 0.95 03/18/2016   GFRAA >60 03/18/2016   GFRNONAA >60 03/18/2016   Hepatic Function Markers Lab Results  Component Value Date   AST 21 03/18/2016   ALT 16 (L) 03/18/2016   ALBUMIN 4.8 03/18/2016   ALKPHOS 85 03/18/2016   HCVAB NEGATIVE 06/14/2012    Electrolytes Lab Results  Component Value Date   NA 136 03/18/2016   K 3.9 03/18/2016   CL 99 (L) 03/18/2016   CALCIUM 9.4 03/18/2016   MG 2.1 03/18/2016   Neuropathy Markers Lab Results  Component Value Date   VITAMINB12 244 03/18/2016   Bone Pathology Markers Lab Results  Component Value Date   ALKPHOS 85 03/18/2016   VD25OH 21 (L) 07/04/2014   25OHVITD1 35 03/18/2016   25OHVITD2 1.0 03/18/2016   25OHVITD3 34 03/18/2016   CALCIUM 9.4 03/18/2016   TESTOFREE 78.9 09/16/2014   TESTOSTERONE 365 09/16/2014   Coagulation Parameters Lab Results  Component Value Date   INR 1.04 06/07/2013   LABPROT 13.4 06/07/2013   PLT 351 11/12/2015   Cardiovascular Markers Lab Results  Component Value Date   BNP 23.9 02/27/2015   HGB 17.4 (H) 11/12/2015   HCT 48.5 11/12/2015   Note: Lab results reviewed.  Recent Diagnostic Imaging Review  Mr Lumbar Spine W Wo Contrast Result Date: 04/24/2016 CLINICAL DATA:  bilateral low back pain and sciatica EXAM: MRI LUMBAR SPINE WITHOUT AND WITH CONTRAST TECHNIQUE: Multiplanar and multiecho pulse sequences of the lumbar spine were obtained without and with intravenous contrast. CONTRAST:  73m MULTIHANCE GADOBENATE DIMEGLUMINE 529 MG/ML IV SOLN COMPARISON:  Lumbar spine radiograph 03/18/2016 FINDINGS: Segmentation: Normal. The lowest disc space is considered to be L5-S1. Alignment:  Normal Vertebrae: L5-S1 hardware is again noted. No acute compression fracture, discitis-osteomyelitis, facet edema or other focal marrow lesion. No epidural collection. Conus medullaris: Extends to the L1 level and appears normal. Paraspinal and other soft tissues: The visualized aorta, IVC and iliac vessels are normal. The visualized retroperitoneal organs and paraspinal soft tissues are normal. Disc levels: T12-L1: Normal disc space and facets. No spinal canal or neuroforaminal stenosis. L1-L2: Normal disc space and facets. No spinal canal or neuroforaminal stenosis.  L2-L3: Normal disc  space and facets. No spinal canal or neuroforaminal stenosis. L3-L4: Normal disc space and facets. No spinal canal or neuroforaminal stenosis. L4-L5: Normal disc space and facets. No spinal canal or neuroforaminal stenosis. L5-S1: Postsurgical changes. Spinal canal is widely patent. There is a small amount of granulation tissue extending into the left neural foramen. No neural foraminal stenosis. IMPRESSION: Unchanged normal alignment of fused L5-S1 level. Small amount of granulation tissue extends into the left L5 neural foramen without causing clear impingement. Otherwise, there is no spinal canal or neural foraminal stenosis. Electronically Signed   By: Ulyses Jarred M.D.   On: 04/24/2016 15:28   Note: Imaging results reviewed.          Meds  The patient has a current medication list which includes the following prescription(s): cyclobenzaprine, dicyclomine, gabapentin, metoprolol, tadalafil, tramadol, trazodone, and ventolin hfa.  Current Outpatient Prescriptions on File Prior to Visit  Medication Sig  . dicyclomine (BENTYL) 20 MG tablet Take 1 tablet (20 mg total) by mouth 3 (three) times daily as needed for spasms.  . metoprolol (LOPRESSOR) 50 MG tablet Take 1 tablet (50 mg total) by mouth 2 (two) times daily.  . tadalafil (CIALIS) 20 MG tablet Take 0.5-1 tablets (10-20 mg total) by mouth every other day as needed for erectile dysfunction.  . traZODone (DESYREL) 50 MG tablet Take 1 tablet (50 mg total) by mouth at bedtime as needed for sleep.  . VENTOLIN HFA 108 (90 Base) MCG/ACT inhaler INHALE 2 PUFFS INTO THE LUNGS EVERY 4 HOURS AS NEEDED FOR WHEEZING OR SHORTNESS OF BREATH.   No current facility-administered medications on file prior to visit.    ROS  Constitutional: Denies any fever or chills Gastrointestinal: No reported hemesis, hematochezia, vomiting, or acute GI distress Musculoskeletal: Denies any acute onset joint swelling, redness, loss of ROM, or  weakness Neurological: No reported episodes of acute onset apraxia, aphasia, dysarthria, agnosia, amnesia, paralysis, loss of coordination, or loss of consciousness  Allergies  Mr. Fleener is allergic to cozaar [losartan] and lisinopril.  [place]  Drug: Mr. Oelkers  reports that he does not use drugs. Alcohol:  reports that he does not drink alcohol. Tobacco:  reports that he has been smoking Cigarettes.  He has a 15.00 pack-year smoking history. He has never used smokeless tobacco. Medical:  has a past medical history of Alcohol withdrawal ([place]); Allergy; Anxiety (Dx 2014); COPD (chronic obstructive pulmonary disease) ([place]); Drug abuse; Enlarged prostate; ETOH abuse; GERD (gastroesophageal reflux disease) (Dx 2003); Headache(784.0); Hepatitis; Hyperlipidemia (Dx 2000); Hypertension (Dx 2011); IBS (irritable bowel syndrome); Irregular heart beat; Long Q-T syndrome (01/23/2014); Mental disorder; Neuromuscular disorder ([place]); Neuropathy ([place]) (06/07/2013); Seizure due to alcohol withdrawal ([place]) (01/23/2014); Shortness of breath; and Withdrawal seizures ([place]) (2014). Family: family history includes Alcohol abuse in his father and paternal grandfather; Breast cancer in his paternal grandmother; Colon polyps in his mother; Hypertension in his mother; Lung cancer in his father.  Past Surgical History:  Procedure Laterality Date  . [place] STUDY N/A 01/16/2015   Procedure: [place] STUDY;  Surgeon: Manus Gunning, MD;  Location: [facility];  Service: Gastroenterology;  Laterality: N/A;  . [place]  . COLONOSCOPY  1998  . LEFT HEART CATHETERIZATION WITH CORONARY ANGIOGRAM N/A 01/13/2014   Procedure: LEFT HEART CATHETERIZATION WITH CORONARY ANGIOGRAM;  Surgeon: Clent Demark, MD;  Location: [facility] CATH [facility];  Service: Cardiovascular;  Laterality: N/A;  . NASAL SINUS SURGERY    . NECK SURGERY  2000   cervial fusion   . [place] FUNDOPLICATION    . SUBACROMIAL DECOMPRESSION Right  11/16/2014   Procedure: RIGHT SHOULDER ARTHROSCOPY WITH SUBACROMIAL DECOMPRESSION ;  Surgeon: Leandrew Koyanagi, MD;  Location: [facility];  Service: Orthopedics;  Laterality: Right;  . UPPER GASTROINTESTINAL ENDOSCOPY     Constitutional Exam  General appearance: Well nourished, well developed, and well hydrated. In no apparent acute distress Vitals:   05/14/16 0800  BP: (!) 150/85  Pulse: 63  Resp: 18  Temp: 97.8 F (36.6 C)  SpO2: 100%  Weight: 225 lb (102.1 kg)  Height: _0  (1.93 m)   BMI Assessment: Estimated body mass index is 27.39 kg/m as calculated from the following:   Height as of this encounter: _1  (1.93 m).   Weight as of this encounter: 225 lb (102.1 kg).  BMI interpretation table: BMI level Category Range association with higher incidence of chronic pain  <18 kg/m2 Underweight   18.5-24.9 kg/m2 Ideal body weight   25-29.9 kg/m2 Overweight Increased incidence by 20%  30-34.9 kg/m2 Obese (Class I) Increased incidence by 68%  35-39.9 kg/m2 Severe obesity (Class II) Increased incidence by 136%  >40 kg/m2 Extreme obesity (Class III) Increased incidence by 254%   BMI Readings from Last 4 Encounters:  05/14/16 27.39 kg/m  04/22/16 27.39 kg/m  04/16/16 27.39 kg/m  03/18/16 26.41 kg/m   Wt Readings from Last 4 Encounters:  05/14/16 225 lb (102.1 kg)  04/22/16 225 lb (102.1 kg)  04/16/16 225 lb (102.1 kg)  03/18/16 217 lb (98.4 kg)  Psych/Mental status: Alert, oriented x 3 (person, place, & time)       Eyes: PERLA Respiratory: No evidence of acute respiratory distress  Cervical Spine Exam  Inspection: No masses, redness, or swelling Alignment: Symmetrical Functional ROM: Unrestricted ROM Stability: No instability detected Muscle strength & Tone: Functionally intact Sensory: Unimpaired Palpation: Non-contributory  Upper Extremity (UE) Exam    Side: Right upper extremity  Side: Left upper extremity  Inspection: No masses, redness, swelling,  or asymmetry. No contractures  Inspection: No masses, redness, swelling, or asymmetry. No contractures  Functional ROM: Unrestricted ROM          Functional ROM: Unrestricted ROM          Muscle strength & Tone: Functionally intact  Muscle strength & Tone: Functionally intact  Sensory: Unimpaired  Sensory: Unimpaired  Palpation: Euthermic  Palpation: Euthermic  Specialized Test(s): Deferred         Specialized Test(s): Deferred          Thoracic Spine Exam  Inspection: No masses, redness, or swelling Alignment: Symmetrical Functional ROM: Unrestricted ROM Stability: No instability detected Sensory: Unimpaired Muscle strength & Tone: Functionally intact Palpation: Non-contributory  Lumbar Spine Exam  Inspection: No masses, redness, or swelling Alignment: Symmetrical Functional ROM: Unrestricted ROM Stability: No instability detected Muscle strength & Tone: Functionally intact Sensory: Unimpaired Palpation: Non-contributory Provocative Tests: Lumbar Hyperextension and rotation test: evaluation deferred today       Patrick's Maneuver: evaluation deferred today              Gait & Posture Assessment  Ambulation: Unassisted Gait: Relatively normal for age and body habitus Posture: WNL   Lower Extremity Exam    Side: Right lower extremity  Side: Left lower extremity  Inspection: No masses, redness, swelling, or asymmetry. No contractures  Inspection: No masses, redness, swelling, or asymmetry. No contractures  Functional ROM: Unrestricted ROM  Functional ROM: Unrestricted ROM          Muscle strength & Tone: Functionally intact  Muscle strength & Tone: Functionally intact  Sensory: Unimpaired  Sensory: Unimpaired  Palpation: No palpable anomalies  Palpation: No palpable anomalies   Assessment  Primary Diagnosis & Pertinent Problem List: The primary encounter diagnosis was Chronic pain syndrome. Diagnoses of Neuropathy, Peripheral (Feet) ([name]) (Location of Primary Source  of Pain) (Bilateral) (R>L), Chronic low back pain (Location of Secondary source of pain) (Bilateral) (R>L), Chronic radicular low back pain (Location of Tertiary source of pain) (Right) (to calf), Long term prescription opiate use, Opiate use, Musculoskeletal pain, Neuropathy ([place]), and Bipolar depression ([place]) were also pertinent to this visit.  Status Diagnosis  Controlled Controlled Controlled 1. Chronic pain syndrome   2. Neuropathy, Peripheral (Feet) ([name]) (Location of Primary Source of Pain) (Bilateral) (R>L)   3. Chronic low back pain (Location of Secondary source of pain) (Bilateral) (R>L)   4. Chronic radicular low back pain (Location of Tertiary source of pain) (Right) (to calf)   5. Long term prescription opiate use   6. Opiate use   7. Musculoskeletal pain   8. Neuropathy ([name])   9. Bipolar depression ([place])      Plan of Care  Pharmacotherapy (Medications Ordered): Meds ordered this encounter  Medications  . cyclobenzaprine (FLEXERIL) 10 MG tablet    Sig: Take 1 tablet (10 mg total) by mouth at bedtime as needed for muscle spasms.    Dispense:  30 tablet    Refill:  0    Do not place medication on "Automatic Refill". Fill one day early if pharmacy is closed on scheduled refill date.  . gabapentin (NEURONTIN) 800 MG tablet    Sig: Take 1 tablet (800 mg total) by mouth every 6 (six) hours.    Dispense:  120 tablet    Refill:  0    Do not place this medication, or any other prescription from our practice, on "Automatic Refill". Patient may have prescription filled one day early if pharmacy is closed on scheduled refill date.  . traMADol (ULTRAM) 50 MG tablet    Sig: Take 1-2 tablets (50-100 mg total) by mouth 4 (four) times daily.    Dispense:  240 tablet    Refill:  0    Fill one day early if pharmacy is closed on scheduled refill date. To last until: 06/13/16.   New Prescriptions   GABAPENTIN (NEURONTIN) 800 MG TABLET    Take 1 tablet (800 mg total) by mouth every 6  (six) hours.   Medications administered today: Mr. Carstens had no medications administered during this visit. Lab-work, procedure(s), and/or referral(s): Orders Placed This Encounter  Procedures  . LUMBAR SYMPATHETIC BLOCK  . ToxASSURE Select 13 (MW), Urine   Imaging and/or referral(s): None  Interventional therapies: Planned, scheduled, and/or pending:   Diagnostic Right LSB   Considering:   Lower extremity EMG/PNCV. (Radiculopathy versus peripheral neuropathy) Upper extremity EMG/PNCV. Diagnostic caudal epidural steroid injection +diagnostic epidurogram Possible Racz procedure Diagnostic cervical epidural steroid injection  Diagnostic bilateral cervical facet block  Possible bilateral cervical facet RFA Diagnostic bilateral lumbar facet block  Possible bilateral lumbar facet RFA  Diagnostic right intra-articular shoulder joint injection Diagnostic right suprascapular nerve block Possible right suprascapular RFA Diagnostic bilateral intra-articular knee joint injection  Possible bilateral series of 5 Hyalgan knee injections  Diagnostic bilateral Genicular nerve block  Possible bilateral Genicular RFA    Palliative PRN treatment(s):   None at this  time.    Provider-requested follow-up: Return in about 1 month (around 06/14/2016) for (MD) Med-Mgmt, in addition, procedure (ASAP).  Future Appointments Date Time Provider [place]  05/20/2016 11:00 AM Aundra Dubin, MD [facility] None  05/22/2016 10:15 AM Milinda Pointer, MD [facility] None  05/30/2016 12:00 PM Jerel Shepherd, LCSW [facility] None  06/13/2016 8:45 AM Milinda Pointer, MD [facility] None   Primary Care Physician: Boykin Nearing, MD Location: [facility] Note by: Kathlen Brunswick. Dossie Arbour, M.D, DABA, DABAPM, DABPM, DABIPP, FIPP Date: 05/14/2016; Time: 1:23 PM  Pain Score Disclaimer: We use the NRS-11 scale. This is a self-reported, subjective measurement of pain severity  with only modest accuracy. It is used primarily to identify changes within a particular patient. It must be understood that outpatient pain scales are significantly less accurate that those used for research, where they can be applied under ideal controlled circumstances with minimal exposure to variables. In reality, the score is likely to be a combination of pain intensity and pain affect, where pain affect describes the degree of emotional arousal or changes in action readiness caused by the sensory experience of pain. Factors such as social and work situation, setting, emotional state, anxiety levels, expectation, and prior pain experience may influence pain perception and show large inter-individual differences that may also be affected by time variables.  Patient instructions provided during this appointment: Patient Instructions   You have been given a script for tramadol today.  You have medications that were e scribed to your pharmacy for gabapentin and flexeril.  You are scheduled for a procedure with sedation.  You have been instructed on the pre procedure instructions.  Do not eat or drink for 8 hours, bring a driver and take your blood pressure medication the morning of the procedure.  A urind drug screen has been done today.     Steps to Quit Smoking Smoking tobacco can be harmful to your health and can affect almost every organ in your body. Smoking puts you, and those around you, at risk for developing many serious chronic diseases. Quitting smoking is difficult, but it is one of the best things that you can do for your health. It is never too late to quit. What are the benefits of quitting smoking? When you quit smoking, you lower your risk of developing serious diseases and conditions, such as:  Lung cancer or lung disease, such as COPD.  Heart disease.  Stroke.  Heart attack.  Infertility.  Osteoporosis and bone fractures. Additionally, symptoms such as coughing,  wheezing, and shortness of breath may get better when you quit. You may also find that you get sick less often because your body is stronger at fighting off colds and infections. If you are pregnant, quitting smoking can help to reduce your chances of having a baby of low birth weight. How do I get ready to quit? When you decide to quit smoking, create a plan to make sure that you are successful. Before you quit:  Pick a date to quit. Set a date within the next two weeks to give you time to prepare.  Write down the reasons why you are quitting. Keep this list in places where you will see it often, such as on your bathroom mirror or in your car or wallet.  Identify the people, places, things, and activities that make you want to smoke (triggers) and avoid them. Make sure to take these actions:  Throw away all cigarettes at home, at work,  and in your car.  Throw away smoking accessories, such as Scientist, research (medical).  Clean your car and make sure to empty the ashtray.  Clean your home, including curtains and carpets.  Tell your family, friends, and coworkers that you are quitting. Support from your loved ones can make quitting easier.  Talk with your health care provider about your options for quitting smoking.  Find out what treatment options are covered by your health insurance. What strategies can I use to quit smoking? Talk with your healthcare provider about different strategies to quit smoking. Some strategies include:  Quitting smoking altogether instead of gradually lessening how much you smoke over a period of time. Research shows that quitting "cold [place]" is more successful than gradually quitting.  Attending in-person counseling to help you build problem-solving skills. You are more likely to have success in quitting if you attend several counseling sessions. Even short sessions of 10 minutes can be effective.  Finding resources and support systems that can help you to quit  smoking and remain smoke-free after you quit. These resources are most helpful when you use them often. They can include:  Online chats with a Social worker.  Telephone quitlines.  Printed Furniture conservator/restorer.  Support groups or group counseling.  Text messaging programs.  Mobile phone applications.  Taking medicines to help you quit smoking. (If you are pregnant or breastfeeding, talk with your health care provider first.) Some medicines contain nicotine and some do not. Both types of medicines help with cravings, but the medicines that include nicotine help to relieve withdrawal symptoms. Your health care provider may recommend:  Nicotine patches, gum, or lozenges.  Nicotine inhalers or sprays.  Non-nicotine medicine that is taken by mouth. Talk with your health care provider about combining strategies, such as taking medicines while you are also receiving in-person counseling. Using these two strategies together makes you more likely to succeed in quitting than if you used either strategy on its own. If you are pregnant or breastfeeding, talk with your health care provider about finding counseling or other support strategies to quit smoking. Do not take medicine to help you quit smoking unless told to do so by your health care provider. What things can I do to make it easier to quit? Quitting smoking might feel overwhelming at first, but there is a lot that you can do to make it easier. Take these important actions:  Reach out to your family and friends and ask that they support and encourage you during this time. Call telephone quitlines, reach out to support groups, or work with a counselor for support.  Ask people who smoke to avoid smoking around you.  Avoid places that trigger you to smoke, such as bars, parties, or smoke-break areas at work.  Spend time around people who do not smoke.  Lessen stress in your life, because stress can be a smoking trigger for some people. To  lessen stress, try:  Exercising regularly.  Deep-breathing exercises.  Yoga.  Meditating.  Performing a body scan. This involves closing your eyes, scanning your body from head to toe, and noticing which parts of your body are particularly tense. Purposefully relax the muscles in those areas.  Download or purchase mobile phone or tablet apps (applications) that can help you stick to your quit plan by providing reminders, tips, and encouragement. There are many free apps, such as QuitGuide from the [employer] Office manager for Disease Control and Prevention). You can find other support for quitting smoking (smoking cessation)  through smokefree.gov and other websites. How will I feel when I quit smoking? Within the first 24 hours of quitting smoking, you may start to feel some withdrawal symptoms. These symptoms are usually most noticeable 2-3 days after quitting, but they usually do not last beyond 2-3 weeks. Changes or symptoms that you might experience include:  Mood swings.  Restlessness, anxiety, or irritation.  Difficulty concentrating.  Dizziness.  Strong cravings for sugary foods in addition to nicotine.  Mild weight gain.  Constipation.  Nausea.  Coughing or a sore throat.  Changes in how your medicines work in your body.  A depressed mood.  Difficulty sleeping (insomnia). After the first 2-3 weeks of quitting, you may start to notice more positive results, such as:  Improved sense of smell and taste.  Decreased coughing and sore throat.  Slower heart rate.  Lower blood pressure.  Clearer skin.  The ability to breathe more easily.  Fewer sick days. Quitting smoking is very challenging for most people. Do not get discouraged if you are not successful the first time. Some people need to make many attempts to quit before they achieve long-term success. Do your best to stick to your quit plan, and talk with your health care provider if you have any questions or  concerns. This information is not intended to replace advice given to you by your health care provider. Make sure you discuss any questions you have with your health care provider. Document Released: 02/19/2001 Document Revised: 10/24/2015 Document Reviewed: 07/12/2014 Elsevier Interactive Patient Education  2017 Elsevier Inc.  Pain Score  Introduction: The pain score used by this practice is the Verbal Numerical Rating Scale (VNRS-11). This is an 11-point scale. It is for adults and children 10 years or older. There are significant differences in how the pain score is reported, used, and applied. Forget everything you learned in the past and learn this scoring system.  General Information: The scale should reflect your current level of pain. Unless you are specifically asked for the level of your worst pain, or your average pain. If you are asked for one of these two, then it should be understood that it is over the past 24 hours.  Basic Activities of Daily Living (ADL): Personal hygiene, dressing, eating, transferring, and using restroom.  Instructions: Most patients tend to report their level of pain as a combination of two factors, their physical pain and their psychosocial pain. This last one is also known as "suffering" and it is reflection of how physical pain affects you socially and psychologically. From now on, report them separately. From this point on, when asked to report your pain level, report only your physical pain. Use the following table for reference.  Pain Clinic Pain Levels (0-5/10)  Pain Level Score Description  No Pain 0   Mild pain 1 Nagging, annoying, but does not interfere with basic activities of daily living (ADL). Patients are able to eat, bathe, get dressed, toileting (being able to get on and off the toilet and perform personal hygiene functions), transfer (move in and out of bed or a chair without assistance), and maintain continence (able to control bladder and  bowel functions). Blood pressure and heart rate are unaffected. A normal heart rate for a healthy adult ranges from 60 to 100 bpm (beats per minute).   Mild to moderate pain 2 Noticeable and distracting. Impossible to hide from other people. More frequent flare-ups. Still possible to adapt and function close to normal. It can be very  annoying and may have occasional stronger flare-ups. With discipline, patients may get used to it and adapt.   Moderate pain 3 Interferes significantly with activities of daily living (ADL). It becomes difficult to feed, bathe, get dressed, get on and off the toilet or to perform personal hygiene functions. Difficult to get in and out of bed or a chair without assistance. Very distracting. With effort, it can be ignored when deeply involved in activities.   Moderately severe pain 4 Impossible to ignore for more than a few minutes. With effort, patients may still be able to manage work or participate in some social activities. Very difficult to concentrate. Signs of autonomic nervous system discharge are evident: dilated pupils (mydriasis); mild sweating (diaphoresis); sleep interference. Heart rate becomes elevated (>115 bpm). Diastolic blood pressure (lower number) rises above 100 mmHg. Patients find relief in laying down and not moving.   Severe pain 5 Intense and extremely unpleasant. Associated with frowning face and frequent crying. Pain overwhelms the senses.  Ability to do any activity or maintain social relationships becomes significantly limited. Conversation becomes difficult. Pacing back and forth is common, as getting into a comfortable position is nearly impossible. Pain wakes you up from deep sleep. Physical signs will be obvious: pupillary dilation; increased sweating; goosebumps; brisk reflexes; cold, clammy hands and feet; nausea, vomiting or dry heaves; loss of appetite; significant sleep disturbance with inability to fall asleep or to remain asleep. When  persistent, significant weight loss is observed due to the complete loss of appetite and sleep deprivation.  Blood pressure and heart rate becomes significantly elevated. Caution: If elevated blood pressure triggers a pounding headache associated with blurred vision, then the patient should immediately seek attention at an urgent or emergency care unit, as these may be signs of an impending stroke.    Emergency Department Pain Levels (6-10/10)  Emergency Room Pain 6 Severely limiting. Requires emergency care and should not be seen or managed at an outpatient pain management facility. Communication becomes difficult and requires great effort. Assistance to reach the emergency department may be required. Facial flushing and profuse sweating along with potentially dangerous increases in heart rate and blood pressure will be evident.   Distressing pain 7 Self-care is very difficult. Assistance is required to transport, or use restroom. Assistance to reach the emergency department will be required. Tasks requiring coordination, such as bathing and getting dressed become very difficult.   Disabling pain 8 Self-care is no longer possible. At this level, pain is disabling. The individual is unable to do even the most "basic" activities such as walking, eating, bathing, dressing, transferring to a bed, or toileting. Fine motor skills are lost. It is difficult to think clearly.   Incapacitating pain 9 Pain becomes incapacitating. Thought processing is no longer possible. Difficult to remember your own name. Control of movement and coordination are lost.   The worst pain imaginable 10 At this level, most patients pass out from pain. When this level is reached, collapse of the autonomic nervous system occurs, leading to a sudden drop in blood pressure and heart rate. This in turn results in a temporary and dramatic drop in blood flow to the brain, leading to a loss of consciousness. Fainting is one of the body's  self defense mechanisms. Passing out puts the brain in a calmed state and causes it to shut down for a while, in order to begin the healing process.    Summary: 1. Refer to this scale when providing [place] with your  pain level. 2. Be accurate and careful when reporting your pain level. This will help with your care. 3. Over-reporting your pain level will lead to loss of credibility. 4. Even a level of 1/10 means that there is pain and will be treated at our facility. 5. High, inaccurate reporting will be documented as "Symptom Exaggeration", leading to loss of credibility and suspicions of possible secondary gains such as obtaining more narcotics, or wanting to appear disabled, for fraudulent reasons. 6. Only pain levels of 5 or below will be seen at our facility. 7. Pain levels of 6 and above will be sent to the Emergency Department and the appointment cancelled. _____________________________________________________________________________________________  Preparing for Procedure with Sedation Instructions: . Oral Intake: Do not eat or drink anything for at least 8 hours prior to your procedure. . Transportation: Public transportation is not allowed. Bring an adult driver. The driver must be physically present in our waiting room before any procedure can be started. Marland Kitchen Physical Assistance: Bring an adult capable of physically assisting you, in the event you need help. . Blood Pressure Medicine: Take your blood pressure medicine with a sip of water the morning of the procedure. . Insulin: Take only  of your normal insulin dose. . Preventing infections: Shower with an antibacterial soap the morning of your procedure. . Build-up your immune system: Take 1000 mg of Vitamin C with every meal (3 times a day) the day prior to your procedure. . Pregnancy: If you are pregnant, call and cancel the procedure. . Sickness: If you have a cold, fever, or any active infections, call and cancel the  procedure. . Arrival: You must be in the facility at least 30 minutes prior to your scheduled procedure. . Children: Do not bring children with you. . Dress appropriately: Bring dark clothing that you would not mind if they get stained. . Valuables: Do not bring any jewelry or valuables. Procedure appointments are reserved for interventional treatments only. Marland Kitchen No Prescription Refills. . No medication changes will be discussed during procedure appointments. No disability issues will be discussed.Lumbar Sympathetic Block Patient Information  Description: The lumbar plexus is a group of nerves that are part of the sympathetic nervous system.  These nerves supply organs in the pelvis and legs.  Lumbar sympathetic blocks are utilized for the diagnosis and treatment of painful conditions in these areas.   The lumbar plexus is located on both sides of the aorta at approximately the level of the second lumbar vertebral body.  The block will be performed with you lying on your abdomen with a pillow underneath.  Using direct x-ray guidance,   The plexus will be located on both sides of the spine.  Numbing medicine will be used to deaden the skin prior to needle insertion.  In most cases, a small amount of sedation can be give by IV prior to the numbing medicine.  One or two small needles will be placed near the plexus and local anesthetic will be injected.  This may make your leg(s) feel warm.  The Entire block usually lasts about 15-25 minutes.  Conditions which may be treated by lumbar sympathetic block:  Reflex sympathetic dystrophy Phantom limb pain Peripheral neuropathy Peripheral vascular disease ( inadequate blood flow ) Cancer pain of pelvis, leg and kidney  Preparation for the injection:  Do note eat any solid food or diary products within 8 hours of your appointment. You may drink clear liquids up to 3 hours before appointment.  Clear liquids include water,  black coffee, juice or soda.  No  milk or cream please. You may take your regular medication, including pain medications, with a sip of water before you appointment.  Diabetics should hold regular insulin ( if taken separately ) and take 1/2 NPH dose the morning of the procedure .  Carry some sugar containing items with you to your appointment. A driver must accompany you and be prepared to drive you home after your procedure. Bring all your current medication with you. An IV may be inserted and sedation may be given at the discretion of the physician.  A blood pressure cuff, EKG and other monitors will often be applied during the procedure.  Some patients may need to have extra oxygen administered for a short period. You will be asked to provide medical information, including your allergies and medications, prior to the procedure.  We must know immediately if your taking blood thinners (like Coumadin/Warfarin) or if you are allergic to IV iodine contrast (dye).  We must know if you could possibly be pregnant.  Possible side-effects  Bleeding from needle site or deeper Infection (rare, can require surgery) Nerve injury (rare) Numbness & tingling (temporary) Collapsed lung (rare) Spinal headache (a headache worse with upright posture) Light-headedness (temporary) Pain at injection site (several days) Decreased blood pressure (temporary) Weakness in legs (temporary) Seizure or other drug reaction (rare)  Call if you experience:  Fever/chills associated with headache or increased back/ neck pain Headache worsened by an upright position New onset weakness or numbness of an extremity below the injection site Hives or difficulty breathing ( go to the emergency room) Inflammation or drainage at the injections site(s) New symptoms which are concerning to you  Please note:  If effective, we will often do a series of 2-3 injections spaced 3-6 weeks apart to maximally decrease your pain.  If initial series is effective, you may  be a candidate for a more permanent block of the lumbar sympathetic plexus.  If you have any questions please call [phone] [facility] .

## 2016-05-14 NOTE — Telephone Encounter (Signed)
Says he went to get meds filled and pharmacy told him it was too early to fill. Wants to speak to nurse.

## 2016-05-14 NOTE — Telephone Encounter (Signed)
Called patient.  He states that the pharmacy would not fill his Tramadol because it was too early.  Informed patient that he got his last script filled on 04-23-16 and could not get it filled for 30 days.  Patient states that script has changed.  Looked back at last med refill and the Tramadol dose and instructions were exactly the same.  Informed patient and he states that the bottle said to take every 4-6 hours instead of 4 times per day.   Called pharmacy to verify what the last instructions on the Tramadol were.  Pharmacy states that the instructions were to take every 4 hours and that this is a monthly issue.

## 2016-05-15 ENCOUNTER — Encounter (HOSPITAL_COMMUNITY): Payer: Self-pay | Admitting: Emergency Medicine

## 2016-05-15 ENCOUNTER — Emergency Department (HOSPITAL_COMMUNITY)
Admission: EM | Admit: 2016-05-15 | Discharge: 2016-05-18 | Disposition: A | Discharge status: Home / Self Care | Payer: Self-pay | Attending: Emergency Medicine | Admitting: Emergency Medicine

## 2016-05-15 ENCOUNTER — Ambulatory Visit (HOSPITAL_COMMUNITY): Payer: Self-pay | Admitting: Psychiatry

## 2016-05-15 DIAGNOSIS — J449 Chronic obstructive pulmonary disease, unspecified: Secondary | ICD-10-CM | POA: Insufficient documentation

## 2016-05-15 DIAGNOSIS — Z79899 Other long term (current) drug therapy: Secondary | ICD-10-CM | POA: Insufficient documentation

## 2016-05-15 DIAGNOSIS — F17210 Nicotine dependence, cigarettes, uncomplicated: Secondary | ICD-10-CM | POA: Insufficient documentation

## 2016-05-15 DIAGNOSIS — F3132 Bipolar disorder, current episode depressed, moderate: Secondary | ICD-10-CM | POA: Diagnosis not present

## 2016-05-15 DIAGNOSIS — I10 Essential (primary) hypertension: Secondary | ICD-10-CM | POA: Insufficient documentation

## 2016-05-15 DIAGNOSIS — F315 Bipolar disorder, current episode depressed, severe, with psychotic features: Secondary | ICD-10-CM

## 2016-05-15 DIAGNOSIS — F1020 Alcohol dependence, uncomplicated: Secondary | ICD-10-CM | POA: Diagnosis present

## 2016-05-15 DIAGNOSIS — R45851 Suicidal ideations: Secondary | ICD-10-CM

## 2016-05-15 LAB — COMPREHENSIVE METABOLIC PANEL
ALBUMIN: 4.7 g/dL (ref 3.5–5.0)
ALT: 40 U/L (ref 17–63)
ANION GAP: 12 (ref 5–15)
AST: 30 U/L (ref 15–41)
Alkaline Phosphatase: 87 U/L (ref 38–126)
BILIRUBIN TOTAL: 0.6 mg/dL (ref 0.3–1.2)
BUN: 5 mg/dL — ABNORMAL LOW (ref 6–20)
CHLORIDE: 101 mmol/L (ref 101–111)
CO2: 25 mmol/L (ref 22–32)
Calcium: 9 mg/dL (ref 8.9–10.3)
Creatinine, Ser: 0.72 mg/dL (ref 0.61–1.24)
GFR calc Af Amer: >60 mL/min (ref >60)
Glucose, Bld: 101 mg/dL — ABNORMAL HIGH (ref 65–99)
POTASSIUM: 3.4 mmol/L — AB (ref 3.5–5.1)
Sodium: 138 mmol/L (ref 135–145)
TOTAL PROTEIN: 7.8 g/dL (ref 6.5–8.1)

## 2016-05-15 LAB — CBC
HCT: 49.2 % (ref 39.0–52.0)
Hemoglobin: 17.7 g/dL — ABNORMAL HIGH (ref 13.0–17.0)
MCH: 29.1 pg (ref 26.0–34.0)
MCHC: 36 g/dL (ref 30.0–36.0)
MCV: 80.9 fL (ref 78.0–100.0)
PLATELETS: 298 10*3/uL (ref 150–400)
RBC: 6.08 MIL/uL — ABNORMAL HIGH (ref 4.22–5.81)
RDW: 13 % (ref 11.5–15.5)
WBC: 11.1 10*3/uL — AB (ref 4.0–10.5)

## 2016-05-15 LAB — ETHANOL: ALCOHOL ETHYL (B): 256 mg/dL — AB (ref <5)

## 2016-05-15 LAB — RAPID URINE DRUG SCREEN, HOSP PERFORMED
Amphetamines: NOT DETECTED
BENZODIAZEPINES: NOT DETECTED
Barbiturates: NOT DETECTED
COCAINE: NOT DETECTED
OPIATES: NOT DETECTED
Tetrahydrocannabinol: NOT DETECTED

## 2016-05-15 LAB — SALICYLATE LEVEL

## 2016-05-15 LAB — ACETAMINOPHEN LEVEL

## 2016-05-15 MED ORDER — LORAZEPAM 2 MG/ML IJ SOLN
1.0000 mg | Freq: Once | INTRAMUSCULAR | Status: DC
  Filled 2016-05-15: qty 1

## 2016-05-15 MED ORDER — TRAMADOL HCL 50 MG PO TABS
50.0000 mg | ORAL_TABLET | Freq: Four times a day (QID) | ORAL | Status: DC
  Administered 2016-05-16: 100 mg via ORAL
  Filled 2016-05-15: qty 2

## 2016-05-15 MED ORDER — GABAPENTIN 400 MG PO CAPS
800.0000 mg | ORAL_CAPSULE | Freq: Four times a day (QID) | ORAL | Status: DC | PRN
  Administered 2016-05-16 (×2): 800 mg via ORAL
  Filled 2016-05-15 (×2): qty 2

## 2016-05-15 NOTE — ED Notes (Addendum)
Pt denies HI.  However, pt states that  does feel like he wants to harm himself d/t his chronic pain  issues and has caused him to be severely depressed. Pt states that he does not have  A plan. Pt also feels like that his problem is  Never resolved and feels that him being here  Will not lead to a solution. Pt states he would rather go home and drink some alcohol and go to sleep.

## 2016-05-15 NOTE — ED Triage Notes (Signed)
Pt reports to ER for psychiatric evaluation; pt's ex-wife states he has been hallucinating and seeing people who were not present and pt endorses this; pt states his chronic pain and his abnormal heart rate as stressors; states his chest hurts when it skips a beat; pt admits to binge drinking the past 2 days; denies drug use; pt states he wants to die to make the pain stop; pt states that if his pain went away he would no longer want to die

## 2016-05-15 NOTE — ED Provider Notes (Signed)
[facility] DEPT Provider Note   CSN: [id number] Arrival date & time: 05/15/16  1913     History   Chief Complaint Chief Complaint  Patient presents with  . Pain  . Psychiatric Evaluation    HPI Mark [name] is a 51 y.o. male with a past medical history significant for COPD, substance abuse, severe neuropathies with chronic pain management by pain management team, hypertension, hyperlipidemia, hepatitis, prolonged QT syndrome  who presents after involuntary commitment by family for worsening suicidal ideation and hallucinations. According to IVC documentation, patient is having worsening suicidal ideation and was having visual hallucinations. Patient has been drinking alcohol tonight and reports drinking boxed wine. Patient reports that he is a chronic pain patient is managed by a pain clinic in [place]. He reports that the pain is still not controlled. He reports years of pain in his neck, back, lower extremity is, and a more recent pain in his abdomen. He denies fevers, chills, or constipation. He does report some recent diarrhea as well as some nausea in regards to his abdominal pain. He says it is all across his abdomen and is severe. He says that he is having suicidal ideation tonight but has no specific plan. He says that he is having these thoughts because he is in so much chronic pain. He denied hallucinations to me however IVC paperwork reports that he is having them.  Patient denies other symptoms on arrival.   HPI  Past Medical History:  Diagnosis Date  . Alcohol withdrawal ([place])   . Allergy   . Anxiety Dx 2014  . COPD (chronic obstructive pulmonary disease) ([place])   . Drug abuse    pt reports opioid dependence due to previous back surgeries  . Enlarged prostate   . ETOH abuse   . GERD (gastroesophageal reflux disease) Dx 2003  . Headache(784.0)   . Hepatitis   . Hyperlipidemia Dx 2000  . Hypertension Dx 2011  . IBS (irritable bowel syndrome)    alternates  constiaption/ diarrhea  . Irregular heart beat   . 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 Mental disorder   . Neuromuscular disorder ([place])    hands hurt   . Neuropathy ([place]) 06/07/2013  . Seizure due to alcohol withdrawal ([place]) 01/23/2014   Per patient report  . Shortness of breath   . Withdrawal seizures ([place]) 2014   last seizure 2014 etoh with drawal    Patient Active Problem List   Diagnosis Date Noted  . Opiate use 05/14/2016  . Occipital headache(B)((R>L) 04/16/2016  . Musculoskeletal pain 04/15/2016  . 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 Major depressive disorder, recurrent episode, moderate ([place]) 04/10/2016  . Long term current use of opiate analgesic 03/18/2016  . Long term prescription opiate use 03/18/2016  . History of fusion of cervical spine 03/18/2016  . Chronic neck pain(4)(B)(R>L) 03/18/2016  . Cervical fusion syndrome 03/18/2016  . 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 Chronic pain of both knees(6)(B)(R>L) 03/18/2016  . GAD (generalized anxiety disorder) 12/06/2015  . Alcohol use disorder, severe, in early remission ([place]) 12/06/2015  . Suicidal ideation 11/07/2015  . Bipolar affective disorder, currently depressed, moderate ([place])   . Non-sustained ventricular tachycardia ([place])   . GERD (gastroesophageal reflux disease) 07/24/2015  . Chronic anxiety 12/19/2014  . 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 Erectile dysfunction 09/16/2014  . Poor dentition 07/28/2014  . Chronic maxillary sinusitis 07/28/2014  . Vitamin D insufficiency 07/05/2014  . Closed fracture of 5th metacarpal 07/04/2014  . Chronic  fatigue 07/04/2014  . Chronic radicular low back pain (Location of Tertiary source of pain) (Right) (to calf) 06/02/2014  . Chronic right shoulder pain(5)(R) 05/12/2014  . Bipolar depression ([place]) 02/08/2014  . Neuropathy, Peripheral (Feet) ([name]) (Location of Primary Source of Pain) (Bilateral) (R>L) 02/08/2014  . 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  Hypokalemia   . Opioid dependence ([place]) 01/22/2014    Class: Acute  . Paroxysmal VT ([place]) 12/19/2013  . Chronic low back pain (Location of Secondary source of pain) (Bilateral) (R>L) 12/28/2012  . Chronic pain syndrome 12/28/2012  . Alcohol intoxication ([place]) 06/09/2012  . 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 Nonsustained ventricular tachycardia ([place]) 01/20/2012  . Tobacco abuse 01/19/2012  . Essential hypertension   . Hyperlipidemia     Past Surgical History:  Procedure Laterality Date  . [place] STUDY N/A 01/16/2015   Procedure: [place] STUDY;  Surgeon: Manus Gunning, MD;  Location: [facility];  Service: Gastroenterology;  Laterality: N/A;  . [place]  . COLONOSCOPY  1998  . LEFT HEART CATHETERIZATION WITH CORONARY ANGIOGRAM N/A 01/13/2014   Procedure: LEFT HEART CATHETERIZATION WITH CORONARY ANGIOGRAM;  Surgeon: Clent Demark, MD;  Location: [facility];  Service: Cardiovascular;  Laterality: N/A;  . NASAL SINUS SURGERY    . NECK SURGERY  2000   cervial fusion   . [place]ISS[place] FUNDOPLICATION    . SUBACROMIAL DECOMPRESSION Right 11/16/2014   Procedure: RIGHT SHOULDER ARTHROSCOPY WITH SUBACROMIAL DECOMPRESSION ;  Surgeon: Leandrew Koyanagi, MD;  Location: [facility];  Service: Orthopedics;  Laterality: Right;  . UPPER GASTROINTESTINAL ENDOSCOPY         Home Medications    Prior to Admission medications   Medication Sig Start Date End Date Taking? Authorizing Provider  gabapentin (NEURONTIN) 800 MG tablet Take 1 tablet (800 mg total) by mouth every 6 (six) hours. 05/14/16 06/13/16 Yes Milinda Pointer, MD  metoprolol (LOPRESSOR) 50 MG tablet Take 1 tablet (50 mg total) by mouth 2 (two) times daily. 07/24/15  Yes Josalyn Funches, MD  cyclobenzaprine (FLEXERIL) 10 MG tablet Take 1 tablet (10 mg total) by mouth at bedtime as needed for muscle spasms. Patient not taking: Reported on 05/15/2016 05/14/16 06/13/16  Milinda Pointer, MD  dicyclomine (BENTYL) 20 MG tablet Take 1 tablet (20 mg total) by mouth 3 (three) times daily as needed for spasms. Patient not taking: Reported on 05/15/2016 07/25/15   Manus Gunning, MD  tadalafil (CIALIS) 20 MG tablet Take 0.5-1 tablets (10-20 mg total) by mouth every other day as needed for erectile dysfunction. Patient not taking: Reported on 05/15/2016 10/26/15   Boykin Nearing, MD  traMADol (ULTRAM) 50 MG tablet Take 1-2 tablets (50-100 mg total) by mouth 4 (four) times daily. 05/14/16 06/13/16  Milinda Pointer, MD  traZODone (DESYREL) 50 MG tablet Take 1 tablet (50 mg total) by mouth at bedtime as needed for sleep. Patient not taking: Reported on 05/15/2016 04/10/16   Norman Clay, MD  VENTOLIN HFA 108 (90 Base) MCG/ACT inhaler INHALE 2 PUFFS INTO THE LUNGS EVERY 4 HOURS AS NEEDED FOR WHEEZING OR SHORTNESS OF BREATH. Patient not taking: Reported on 05/15/2016 04/05/16   Arnoldo Morale, MD    Family History Family History  Problem Relation Age of Onset  . Lung cancer Father     was a smoker  . Alcohol abuse Father   . Hypertension Mother   . Colon polyps Mother   .  Breast cancer Paternal Grandmother   . Alcohol abuse Paternal Grandfather   . Colon cancer Neg Hx   . Rectal cancer Neg Hx   . Stomach cancer Neg Hx     Social History Social History  Substance Use Topics  . Smoking status: Current Every Day Smoker    Packs/day: 0.50    Years: 30.00    Types: Cigarettes  . Smokeless tobacco: Never Used     Comment: 11-30-2014 info given, Has cut back from 2 pks to 1/2 pack and "working on it"  . Alcohol use No     Comment: occ; binge drinking occasionally     Allergies   Cozaar [losartan] and Lisinopril   Review of Systems Review of Systems  Constitutional: Negative for activity change, chills, diaphoresis, fatigue and fever.  HENT: Negative for congestion and rhinorrhea.   Eyes: Negative for visual disturbance.  Respiratory: Negative for cough, chest  tightness, shortness of breath, wheezing and stridor.   Cardiovascular: Negative for chest pain, palpitations and leg swelling.  Gastrointestinal: Positive for abdominal pain, diarrhea and nausea. Negative for abdominal distention, blood in stool, constipation and vomiting.  Genitourinary: Negative for difficulty urinating, dysuria and flank pain.  Musculoskeletal: Positive for back pain and neck pain. Negative for gait problem and neck stiffness.  Skin: Negative for rash and wound.  Neurological: Negative for dizziness, weakness, light-headedness and headaches.  Psychiatric/Behavioral: Negative for agitation and confusion.  All other systems reviewed and are negative.    Physical Exam Updated Vital Signs BP 145/91 (BP Location: Left Arm)   Pulse 82   Temp 98 F (36.7 C) (Oral)   Resp 20   SpO2 98%   Physical Exam  Constitutional: He is oriented to person, place, and time. He appears well-developed and well-nourished. No distress.  HENT:  Head: Normocephalic and atraumatic.  Right Ear: External ear normal.  Left Ear: External ear normal.  Nose: Nose normal.  Mouth/Throat: Oropharynx is clear and moist. No oropharyngeal exudate.  Eyes: Conjunctivae and EOM are normal. Pupils are equal, round, and reactive to light.  Neck: Normal range of motion. Neck supple.  Cardiovascular: Normal rate and regular rhythm.   No murmur heard. Pulmonary/Chest: Effort normal and breath sounds normal. No stridor. No respiratory distress. He has no wheezes. He exhibits no tenderness.  Abdominal: Soft. He exhibits no distension. There is tenderness. There is no rigidity, no rebound, no guarding and no CVA tenderness.  Musculoskeletal: He exhibits tenderness. He exhibits no edema.  Neurological: He is alert and oriented to person, place, and time. He displays normal reflexes. No cranial nerve deficit. He exhibits normal muscle tone. Coordination normal.  Skin: Skin is warm. Capillary refill takes less  than 2 seconds. No rash noted. He is not diaphoretic. No erythema. No pallor.  Psychiatric: He is not agitated and not actively hallucinating. He expresses suicidal ideation. He expresses no homicidal ideation. He expresses no suicidal plans and no homicidal plans.     ED Treatments / Results  Labs (all labs ordered are listed, but only abnormal results are displayed) Labs Reviewed  COMPREHENSIVE METABOLIC PANEL - Abnormal; Notable for the following:       Result Value   Potassium 3.4 (*)    Glucose, Bld 101 (*)    BUN 5 (*)    All other components within normal limits  ETHANOL - Abnormal; Notable for the following:    Alcohol, Ethyl (B) 256 (*)    All other components within normal limits  ACETAMINOPHEN LEVEL - Abnormal; Notable for the following:    Acetaminophen (Tylenol), Serum <10 (*)    All other components within normal limits  CBC - Abnormal; Notable for the following:    WBC 11.1 (*)    RBC 6.08 (*)    Hemoglobin 17.7 (*)    All other components within normal limits  URINALYSIS, ROUTINE W REFLEX MICROSCOPIC - Abnormal; Notable for the following:    Color, Urine STRAW (*)    Bacteria, UA RARE (*)    All other components within normal limits  SALICYLATE LEVEL  RAPID URINE DRUG SCREEN, HOSP PERFORMED  LIPASE, BLOOD    EKG  EKG Interpretation  Date/Time:  Wednesday May 15 2016 20:18:22 EST Ventricular Rate:  125 PR Interval:    QRS Duration: 109 QT Interval:  274 QTC Calculation: 341 R Axis:   -7 Text Interpretation:  Age not entered, assumed to be  51 years old for purpose of ECG interpretation Sinus tachycardia Ventricular bigeminy Probable left atrial enlargement Abnormal R-wave progression, early transition Borderline repol abnrm, anterolateral leads Baseline wander in lead(s) I III aVR aVL aVF V2 Bigeminy.  When compared to prior, similar bigeminy.  No STEMI Confirmed by Sherry Ruffing MD, Sully Dyment [id number]) on 05/15/2016 11:23:02 PM       Radiology No results  found.  Procedures Procedures (including critical care time)  Medications Ordered in ED Medications  gabapentin (NEURONTIN) capsule 800 mg (800 mg Oral Given 05/16/16 0106)  traMADol (ULTRAM) tablet 50-100 mg (100 mg Oral Given 05/16/16 0001)  alum & mag hydroxide-simeth (MAALOX/MYLANTA) 200-200-20 MG/5ML suspension 30 mL (not administered)  ibuprofen (ADVIL,MOTRIN) tablet 600 mg (not administered)  acetaminophen (TYLENOL) tablet 650 mg (not administered)  LORazepam (ATIVAN) tablet 1 mg (1 mg Oral Given 05/16/16 0011)     Initial Impression / Assessment and Plan / ED Course  I have reviewed the triage vital signs and the nursing notes.  Pertinent labs & imaging results that were available during my care of the patient were reviewed by me and considered in my medical decision making (see chart for details).     Mark [name] is a 51 y.o. male with a past medical history significant for COPD, substance abuse, severe neuropathies with chronic pain management by pain management team, hypertension, hyperlipidemia, hepatitis, prolonged QT syndrome  who presents after involuntary commitment by family for worsening suicidal ideation and hallucinations.  History and exam are seen above. Parafon exam, patient is alert and oriented. Patient is reportedly agitated on arrival but is resting calmly on my exam. Patient is complaining of chronic pain in his back and legs and abdomen. Patient reports drinking alcohol today. Patient's abdomen was tender across his abdomen. Patient's lungs were clear. Patient had normal bowel sounds.  Patient will have laboratory testing to look for etiology of abdominal pain, suspect it is chronic pain that may be exacerbated by his alcohol abuse. Patient will be given his home pain medications in the chart. Patient will be given some Ativan for his nausea and his agitation as this will not prolong his QT. EKG showed a non-prolonged QTc but did have similar bigeminy to  prior.  Lipase not elevated. Alcohol elevated at 256. No evidence of UTI, elevated liver function, or elevated kidney function. Urinalysis reassuring. CBC showed slight leukocytosis but there was also elevated hemoglobin. Suspect dehydration as cause of these abnormalities. Patient will be given fluids orally.  His laboratory testing is reassuring, patient is medically cleared for behavioral health evaluation for  his suicidal ideation and hallucinations.     Final Clinical Impressions(s) / ED Diagnoses   Final diagnoses:  Suicidal ideation       Courtney Paris, MD 05/16/16 [phone]

## 2016-05-16 DIAGNOSIS — F315 Bipolar disorder, current episode depressed, severe, with psychotic features: Secondary | ICD-10-CM | POA: Diagnosis present

## 2016-05-16 LAB — URINALYSIS, ROUTINE W REFLEX MICROSCOPIC
Bilirubin Urine: NEGATIVE
GLUCOSE, UA: NEGATIVE mg/dL
Hgb urine dipstick: NEGATIVE
Ketones, ur: NEGATIVE mg/dL
Leukocytes, UA: NEGATIVE
Nitrite: NEGATIVE
PROTEIN: NEGATIVE mg/dL
SQUAMOUS EPITHELIAL / LPF: NONE SEEN
Specific Gravity, Urine: 1.005 (ref 1.005–1.030)
pH: 6 (ref 5.0–8.0)

## 2016-05-16 LAB — LIPASE, BLOOD: LIPASE: 32 U/L (ref 11–51)

## 2016-05-16 MED ORDER — CHLORDIAZEPOXIDE HCL 25 MG PO CAPS: 25.0000 mg | ORAL_CAPSULE | Freq: Every day | ORAL | Status: DC

## 2016-05-16 MED ORDER — GABAPENTIN 300 MG PO CAPS
300.0000 mg | ORAL_CAPSULE | Freq: Three times a day (TID) | ORAL | Status: DC
  Administered 2016-05-16 – 2016-05-18 (×6): 300 mg via ORAL
  Filled 2016-05-16 (×6): qty 1

## 2016-05-16 MED ORDER — HYDROXYZINE HCL 25 MG PO TABS: 25.0000 mg | ORAL_TABLET | Freq: Four times a day (QID) | ORAL | Status: DC | PRN

## 2016-05-16 MED ORDER — METOPROLOL TARTRATE 25 MG PO TABS
50.0000 mg | ORAL_TABLET | Freq: Two times a day (BID) | ORAL | Status: DC
Start: 2016-05-16 — End: 2016-05-18
  Administered 2016-05-16 – 2016-05-18 (×5): 50 mg via ORAL
  Filled 2016-05-16 (×5): qty 2

## 2016-05-16 MED ORDER — CHLORDIAZEPOXIDE HCL 25 MG PO CAPS
25.0000 mg | ORAL_CAPSULE | Freq: Three times a day (TID) | ORAL | Status: AC
  Administered 2016-05-17 (×3): 25 mg via ORAL
  Filled 2016-05-16 (×4): qty 1

## 2016-05-16 MED ORDER — LORAZEPAM 1 MG PO TABS
2.0000 mg | ORAL_TABLET | Freq: Once | ORAL | Status: AC
  Administered 2016-05-16: 2 mg via ORAL
  Filled 2016-05-16: qty 2

## 2016-05-16 MED ORDER — ONDANSETRON 4 MG PO TBDP
4.0000 mg | ORAL_TABLET | Freq: Four times a day (QID) | ORAL | Status: DC | PRN
Start: 2016-05-16 — End: 2016-05-16

## 2016-05-16 MED ORDER — CHLORDIAZEPOXIDE HCL 25 MG PO CAPS
25.0000 mg | ORAL_CAPSULE | ORAL | Status: DC
  Administered 2016-05-18: 25 mg via ORAL
  Filled 2016-05-16: qty 1

## 2016-05-16 MED ORDER — ALBUTEROL SULFATE HFA 108 (90 BASE) MCG/ACT IN AERS: 2.0000 | INHALATION_SPRAY | Freq: Four times a day (QID) | RESPIRATORY_TRACT | Status: DC | PRN

## 2016-05-16 MED ORDER — CHLORDIAZEPOXIDE HCL 25 MG PO CAPS
25.0000 mg | ORAL_CAPSULE | Freq: Four times a day (QID) | ORAL | Status: AC
  Administered 2016-05-16 (×4): 25 mg via ORAL
  Filled 2016-05-16 (×3): qty 1

## 2016-05-16 MED ORDER — LOPERAMIDE HCL 2 MG PO CAPS: 2.0000 mg | ORAL_CAPSULE | ORAL | Status: DC | PRN

## 2016-05-16 MED ORDER — CHLORDIAZEPOXIDE HCL 25 MG PO CAPS
25.0000 mg | ORAL_CAPSULE | Freq: Four times a day (QID) | ORAL | Status: DC | PRN
  Administered 2016-05-16 – 2016-05-17 (×3): 25 mg via ORAL
  Filled 2016-05-16 (×4): qty 1

## 2016-05-16 MED ORDER — HYDROXYZINE HCL 25 MG PO TABS
25.0000 mg | ORAL_TABLET | Freq: Four times a day (QID) | ORAL | Status: DC | PRN
  Administered 2016-05-16: 25 mg via ORAL
  Filled 2016-05-16: qty 1

## 2016-05-16 MED ORDER — VITAMIN B-1 100 MG PO TABS
100.0000 mg | ORAL_TABLET | Freq: Every day | ORAL | Status: DC
  Administered 2016-05-17 – 2016-05-18 (×2): 100 mg via ORAL
  Filled 2016-05-16 (×2): qty 1

## 2016-05-16 MED ORDER — ADULT MULTIVITAMIN W/MINERALS CH
1.0000 | ORAL_TABLET | Freq: Every day | ORAL | Status: DC
  Administered 2016-05-16 – 2016-05-18 (×3): 1 via ORAL
  Filled 2016-05-16 (×3): qty 1

## 2016-05-16 MED ORDER — ACETAMINOPHEN 325 MG PO TABS: 650.0000 mg | ORAL_TABLET | ORAL | Status: DC | PRN

## 2016-05-16 MED ORDER — IBUPROFEN 200 MG PO TABS
600.0000 mg | ORAL_TABLET | Freq: Three times a day (TID) | ORAL | Status: DC | PRN
  Administered 2016-05-16 – 2016-05-17 (×2): 600 mg via ORAL
  Filled 2016-05-16 (×2): qty 3

## 2016-05-16 MED ORDER — THIAMINE HCL 100 MG/ML IJ SOLN
100.0000 mg | Freq: Once | INTRAMUSCULAR | Status: AC
  Administered 2016-05-16: 100 mg via INTRAMUSCULAR
  Filled 2016-05-16: qty 2

## 2016-05-16 MED ORDER — TRAZODONE HCL 100 MG PO TABS
100.0000 mg | ORAL_TABLET | Freq: Every day | ORAL | Status: DC
  Administered 2016-05-16 – 2016-05-17 (×2): 100 mg via ORAL
  Filled 2016-05-16 (×2): qty 1

## 2016-05-16 MED ORDER — LORAZEPAM 1 MG PO TABS
1.0000 mg | ORAL_TABLET | Freq: Once | ORAL | Status: AC
  Administered 2016-05-16: 1 mg via ORAL
  Filled 2016-05-16: qty 1

## 2016-05-16 MED ORDER — ALUM & MAG HYDROXIDE-SIMETH 200-200-20 MG/5ML PO SUSP: 30.0000 mL | ORAL | Status: DC | PRN

## 2016-05-16 NOTE — BH Assessment (Addendum)
[facility] Assessment Progress Note  Per Corena Pilgrim, MD, this pt requires psychiatric hospitalization at this time.  Pt presents under IVC initiated by his daughter and upheld by Dr Darleene Cleaver.  The following facilities have been contacted to seek placement for this pt, with results as noted:  Beds available, information sent, decision pending:  [facility]:  [facility]   [name], [place] Triage Specialist [phone]

## 2016-05-16 NOTE — BH Assessment (Signed)
Tele Assessment Note   Mark [name] is an 51 y.o. male presenting to [facility] under petition for involuntary commitment. Pt stated "I'm having a bad day". "I ran out of my pain meds". Pt denies SI, HI and AVH to this Probation officer; however when speaking with the nurse pt endorsed suicidal and homicidal ideation. It has been documented that pt stated he wants to die to make the pain stop. PT also reported visual hallucinations to the nurse; however he did not provide many detail.  No illicit substance use reported. Pt reported that he drinks alcohol "a few times" per year. Pt's alcohol level is 256. No physical, sexual or emotional abuse reported.   Collateral information gathered from Kaspian Muccio [phone]  Who reported for the past couple of days pt has been upset and saying that he wanted to kill himself. She reported that pt has been calling her mother and sister telling them that he wanted to kill himself. She also reported that pt told her that he tried to stab himself in the head but stated they won't let me. She reported that pt was referring to hallucinations. She shared that pt has been seeing demons and dead people. She reported that when she went to check on patient today he was incoherent and delusional. She reported that pt has a bad alcohol problem and has been hospitalized multiple times. She shared that pt has been diagnosed with bipolar, depression and anxiety. She reported that pt sees a therapist; however she does not know the name of his therapist.   Diagnosis: Alcohol Use Disorder, Severe   Past Medical History:  Past Medical History:  Diagnosis Date  . Alcohol withdrawal ([employer])   . Allergy   . Anxiety Dx 2014  . COPD (chronic obstructive pulmonary disease) ([place])   . Drug abuse    pt reports opioid dependence due to previous back surgeries  . Enlarged prostate   . ETOH abuse   . GERD (gastroesophageal reflux disease) Dx 2003  . Headache(784.0)   . Hepatitis   .  Hyperlipidemia Dx 2000  . Hypertension Dx 2011  . IBS (irritable bowel syndrome)    alternates constiaption/ diarrhea  . Irregular heart beat   . 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 Mental disorder   . Neuromuscular disorder ([place])    hands hurt   . Neuropathy ([place]) 06/07/2013  . Seizure due to alcohol withdrawal ([place]) 01/23/2014   Per patient report  . Shortness of breath   . Withdrawal seizures ([place]) 2014   last seizure 2014 etoh with drawal    Past Surgical History:  Procedure Laterality Date  . [place] STUDY N/A 01/16/2015   Procedure: [place] STUDY;  Surgeon: Manus Gunning, MD;  Location: [facility] END[facility];  Service: Gastroenterology;  Laterality: N/A;  . Long Neck  . COLONOSCOPY  1998  . LEFT HEART CATHETERIZATION WITH CORONARY ANGIOGRAM N/A 01/13/2014   Procedure: LEFT HEART CATHETERIZATION WITH CORONARY ANGIOGRAM;  Surgeon: Clent Demark, MD;  Location: [facility];  Service: Cardiovascular;  Laterality: N/A;  . NASAL SINUS SURGERY    . NECK SURGERY  2000   cervial fusion   . NISSEN FUNDOPLICATION    . SUBACROMIAL DECOMPRESSION Right 11/16/2014   Procedure: RIGHT SHOULDER ARTHROSCOPY WITH SUBACROMIAL DECOMPRESSION ;  Surgeon: Leandrew Koyanagi, MD;  Location: [facility];  Service: Orthopedics;  Laterality: Right;  . UPPER GASTROINTESTINAL ENDOSCOPY      Family History:  Family History  Problem Relation Age of Onset  . Lung cancer Father     was a smoker  . Alcohol abuse Father   . Hypertension Mother   . Colon polyps Mother   . Breast cancer Paternal Grandmother   . Alcohol abuse Paternal Grandfather   . Colon cancer Neg Hx   . Rectal cancer Neg Hx   . Stomach cancer Neg Hx     Social History:  reports that he has been smoking Cigarettes.  He has a 15.00 pack-year smoking history. He has never used smokeless tobacco. He reports that he does not drink alcohol or use drugs.  Additional Social History:  Alcohol / Drug Use History of  alcohol / drug use?: Yes Longest period of sobriety (when/how long): 8 months  then 5 day binge drinking which ended up in involunteery hospitalization 11/07/15 - 11/10/15 intoxication and suicidal ideation Substance #1 Name of Substance 1: Alcohol  1 - Age of First Use: teens  1 - Amount (size/oz): 1 bottle  1 - Frequency: "only a few times per year"  1 - Duration: ongoing  1 - Last Use / Amount: 05-15-16 BAL-256  CIWA: CIWA-Ar BP: 145/91 Pulse Rate: 82 COWS:    PATIENT STRENGTHS: (choose at least two) Average or above average intelligence Supportive family/friends  Allergies:  Allergies  Allergen Reactions  . Cozaar [Losartan] Anaphylaxis  . Lisinopril Anaphylaxis    Home Medications:  (Not in a hospital admission)  OB/GYN Status:  No LMP for male patient.  General Assessment Data [facility] of [facility]: [facility] TTS Assessment: In system Is this a Tele or Face-to-Face Assessment?: Face-to-Face Is this an Initial Assessment or a Re-assessment for this encounter?: Initial Assessment Marital status: Single Living Arrangements: Parent Can pt return to current living arrangement?: Yes Admission Status: Involuntary Is patient capable of signing voluntary admission?: Yes Referral Source: Medical Floor Inpatient Insurance type: None      Crisis Care Plan Living Arrangements: Parent [facility] of Psychiatrist: [facility] Name of Therapist: [facility]  Education Status Is patient currently in school?: No Highest grade of school patient has completed: High School   Risk to self with the past 6 months Suicidal Ideation: No Has patient been a risk to self within the past 6 months prior to admission? : No Suicidal Intent: No Has patient had any suicidal intent within the past 6 months prior to admission? : No Is patient at risk for suicide?: No Suicidal Plan?: No Has patient had any suicidal plan within the past 6 months prior to admission? : No Access to Means: No What has been  your use of drugs/alcohol within the last 12 months?: Alcohol use reported.  Previous Attempts/Gestures: No How many times?: 0 Other Self Harm Risks: Pt denies  Triggers for Past Attempts: None known Intentional Self Injurious Behavior: None Family Suicide History: No Recent stressful life event(s): Other (Comment) (Chronic pain) Persecutory voices/beliefs?: No Depression: Yes Depression Symptoms: Tearfulness, Fatigue Substance abuse history and/or treatment for substance abuse?: No Suicide prevention information given to non-admitted patients: Not applicable  Risk to Others within the past 6 months Homicidal Ideation: No Does patient have any lifetime risk of violence toward others beyond the six months prior to admission? : No Thoughts of Harm to Others: No Current Homicidal Intent: No Current Homicidal Plan: No Access to Homicidal Means: No Identified Victim: N/A History of harm to others?: No Assessment of Violence: None Noted Violent Behavior Description: No violent behaviors observed. Pt is  calm and cooperative at this time.  Does patient have access to weapons?: No Criminal Charges Pending?: No Does patient have a court date: No Is patient on probation?: No  Psychosis Hallucinations: None noted Delusions: None noted  Mental Status Report Appearance/Hygiene: In scrubs Eye Contact: Fair Motor Activity: Freedom of movement Speech: Logical/coherent Level of Consciousness: Quiet/awake Mood: Euthymic Affect: Appropriate to circumstance Anxiety Level: Minimal Thought Processes: Coherent, Relevant Judgement: Impaired (BAL-256) Orientation: Person, Place, Time, Situation, Appropriate for developmental age Obsessive Compulsive Thoughts/Behaviors: None  Cognitive Functioning Concentration: Decreased Memory: Recent Intact, Remote Intact IQ: Average Insight: Fair Impulse Control: Fair Appetite: Good Sleep: Decreased Total Hours of Sleep:  (awakes every  hour) Vegetative Symptoms: None  ADLScreening [facility] Assessment Services) Patient's cognitive ability adequate to safely complete daily activities?: Yes Patient able to express need for assistance with ADLs?: Yes Independently performs ADLs?: Yes (appropriate for developmental age)  Prior Inpatient Therapy Prior Inpatient Therapy: Yes Prior Therapy Dates: 2015 Prior Therapy Facilty/Provider(s): [facility]  Reason for Treatment: Detox   Prior Outpatient Therapy Prior Outpatient Therapy: Yes Prior Therapy Dates: Current  Prior Therapy Facilty/Provider(s): [facility] Reason for Treatment: Substance abuse  Does patient have an ACCT team?: No Does patient have Intensive In-House Services?  : No Does patient have [facility] services? : No Does patient have P4CC services?: No  ADL Screening (condition at time of admission) Patient's cognitive ability adequate to safely complete daily activities?: Yes Is the patient deaf or have difficulty hearing?: No Does the patient have difficulty seeing, even when wearing glasses/contacts?: No Does the patient have difficulty concentrating, remembering, or making decisions?: No Patient able to express need for assistance with ADLs?: Yes Does the patient have difficulty dressing or bathing?: No Independently performs ADLs?: Yes (appropriate for developmental age)       Abuse/Neglect Assessment (Assessment to be complete while patient is alone) Physical Abuse: Denies Verbal Abuse: Denies Sexual Abuse: Denies Exploitation of patient/patient's resources: Denies Self-Neglect: Denies     Regulatory affairs officer (For Healthcare) Does Patient Have a Medical Advance Directive?: No Would patient like information on creating a medical advance directive?: No - Patient declined    Additional Information 1:1 In Past 12 Months?: No CIRT Risk: No Elopement Risk: No Does patient have medical clearance?: Yes     Disposition:  Disposition Initial Assessment  Completed for this Encounter: Yes Disposition of Patient: Inpatient treatment program Type of inpatient treatment program: Adult  Janari Gagner S 05/16/2016 2:11 AM

## 2016-05-16 NOTE — Progress Notes (Signed)
05/16/16 1348:  LRT introduced self to pt and offered activities.  Pt declined.  Victorino Sparrow, LRT/CTRS

## 2016-05-16 NOTE — ED Notes (Signed)
Pt sleeping at present, no distress noted, calm & cooperative.  Monitoring for safety, Q 15 min checks in effect. 

## 2016-05-16 NOTE — ED Notes (Signed)
Presents with SI, denying at present, due to chronic pain, abd. Feet & legs.  Rates pain as 8 on pain scale.  Denies HI, denies AVH.  IVC papers report pt with positive AVH.  Diagnosed with Bipolar DO, denies previous SI attempts.  Monitoring for safety, Q 15 min checks in effect.  Safety check for contraband completed, no items found.

## 2016-05-16 NOTE — ED Notes (Signed)
Pt is very anxious.  His hands are trembling extremely bad.  MD made aware and order obtained.

## 2016-05-17 MED ORDER — HYDROXYZINE HCL 25 MG PO TABS
50.0000 mg | ORAL_TABLET | Freq: Once | ORAL | Status: AC
  Administered 2016-05-17: 50 mg via ORAL
  Filled 2016-05-17: qty 2

## 2016-05-17 NOTE — ED Notes (Signed)
Pt. C/o insomnia.

## 2016-05-17 NOTE — ED Notes (Signed)
Hourly rounding reveals patient in room. No complaints, stable, in no acute distress. Q15 minute rounds and monitoring via Security Cameras to continue. 

## 2016-05-17 NOTE — ED Notes (Signed)
Report to include Situation, Background, Assessment, and Recommendations received from Diane RN. Patient alert and oriented, warm and dry, in no acute distress. Patient denies SI, HI, AVH and pain. Patient made aware of Q15 minute rounds and security cameras for their safety. Patient instructed to come to me with needs or concerns. 

## 2016-05-17 NOTE — BH Assessment (Signed)
Reassessment:   Writer met with patient to complete a face to face reassessment. Patient presented to [facility] yesterday under petition for involuntary commitment. Patient endorsed suicidal thoughts and homicidal thoughts on this day. It was documented that patient stated he wants to die to make the pain stop. He also reported visual hallucinations. Patient's alcohol level was also elevated.   Today patient denies SI, HI, and AVH's.. Sts that he only made suicidal statements because he was under the influence of alcohol. Sts, "I don't really remember a lot of what I said".  Patient's alcohol level was 256. Patient sts that he is able to contract for safety. He denies a history of harm to others.  Patient is calm and cooperative. Denies AVH's. Patient does not appear to be responding to internal stimuli. Patient is dressed in scrubs. His speech is normal. Affect is flat. Insight and Judgement appear fair. He is oriented x4.

## 2016-05-17 NOTE — ED Notes (Signed)
Introduced self to patient/family. Pt oriented to unit expectations.  Assessed pt for:  A) Anxiety &/or agitation: Today pt is calm and cooperative, but reports significant increase in anxiety. He said that he has agitation and cannot sit still, but he does not appear to be agitated. He has received supportive medication as per orders. His daughter is on his list to receive information and she appears to be supportive.   S) Safety: Safety maintained with q-15-minute checks and hourly rounds by staff.  A) ADLs: Pt able to perform ADLs independently.  P) Pick-Up (room cleanliness): Pt's room clean and free of clutter.

## 2016-05-17 NOTE — Progress Notes (Signed)
05/17/16 1338:  LRT went to pt room to offer activities, pt was with visitor.  Victorino Sparrow, LRT/CTRS

## 2016-05-18 DIAGNOSIS — Z79899 Other long term (current) drug therapy: Secondary | ICD-10-CM

## 2016-05-18 DIAGNOSIS — F3132 Bipolar disorder, current episode depressed, moderate: Secondary | ICD-10-CM

## 2016-05-18 DIAGNOSIS — Z888 Allergy status to other drugs, medicaments and biological substances status: Secondary | ICD-10-CM

## 2016-05-18 DIAGNOSIS — Z811 Family history of alcohol abuse and dependence: Secondary | ICD-10-CM

## 2016-05-18 DIAGNOSIS — F17210 Nicotine dependence, cigarettes, uncomplicated: Secondary | ICD-10-CM

## 2016-05-18 DIAGNOSIS — F1020 Alcohol dependence, uncomplicated: Secondary | ICD-10-CM

## 2016-05-18 NOTE — ED Notes (Signed)
Hourly rounding reveals patient sleeping in room. No complaints, stable, in no acute distress. Q15 minute rounds and monitoring via Security Cameras to continue. 

## 2016-05-18 NOTE — BHH Suicide Risk Assessment (Signed)
Suicide Risk Assessment  Discharge Assessment   [facility] Discharge Suicide Risk Assessment   Principal Problem: Bipolar affective disorder, depressed, severe, with psychotic behavior [facility]) Discharge Diagnoses:  Patient Active Problem List   Diagnosis Date Noted  . Bipolar affective disorder, depressed, severe, with psychotic behavior ([place]) [F31.5] 05/16/2016    Priority: High  . Alcohol use disorder, severe, dependence ([place]) [F10.20]     Priority: High  . Opiate use [F11.90] 05/14/2016  . Occipital headache(B)((R>L) [R51] 04/16/2016  . Musculoskeletal pain [M79.1] 04/15/2016  . 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 Major depressive disorder, recurrent episode, moderate ([place]) [F33.1] 04/10/2016  . Long term current use of opiate analgesic [Z79.891] 03/18/2016  . Long term prescription opiate use [Z79.891] 03/18/2016  . History of fusion of cervical spine [Z98.1] 03/18/2016  . Chronic neck pain(4)(B)(R>L) [M54.2, G89.29] 03/18/2016  . Cervical fusion syndrome [Q76.1] 03/18/2016  . Failed back surgical syndrome [M96.1] 03/18/2016  . Chronic pain of both knees(6)(B)(R>L) [M25.561, M25.562, G89.29] 03/18/2016  . GAD (generalized anxiety disorder) [F41.1] 12/06/2015  . Alcohol use disorder, severe, in early remission ([place]) [F10.21] 12/06/2015  . Suicidal ideation [R45.851] 11/07/2015  . Bipolar affective disorder, currently depressed, moderate ([place]) [F31.32]   . Non-sustained ventricular tachycardia ([place]) [I47.2]   . GERD (gastroesophageal reflux disease) [K21.9] 07/24/2015  . Chronic anxiety [F41.9] 12/19/2014  . Tender prostate [N42.81] 09/16/2014  . Erectile dysfunction [N52.9] 09/16/2014  . Poor dentition [K08.9] 07/28/2014  . Chronic maxillary sinusitis [J32.0] 07/28/2014  . Vitamin D insufficiency [E55.9] 07/05/2014  . Closed fracture of 5th metacarpal [S62.308A] 07/04/2014  . Chronic fatigue [R53.82] 07/04/2014  . Chronic radicular low back pain (Location  of Tertiary source of pain) (Right) (to calf) [M54.16, G89.29] 06/02/2014  . Chronic right shoulder pain(5)(R) [M25.511, G89.29] 05/12/2014  . Bipolar depression ([place]) [F31.30] 02/08/2014  . Neuropathy, Peripheral (Feet) ([name]) (Location of Primary Source of Pain) (Bilateral) (R>L) [G62.9] 02/08/2014  . Long Q-T syndrome [I45.81] 01/23/2014  . Hypokalemia [E87.6]   . Opioid dependence ([place]) [F11.20] 01/22/2014    Class: Acute  . Paroxysmal VT ([place]) [I47.2] 12/19/2013  . Chronic low back pain (Location of Secondary source of pain) (Bilateral) (R>L) [M54.42, M54.41, G89.29] 12/28/2012  . Chronic pain syndrome [G89.4] 12/28/2012  . Alcohol intoxication ([place]) [F10.929] 06/09/2012  . 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 Nonsustained ventricular tachycardia ([place]) [I47.2] 01/20/2012  . Tobacco abuse [Z72.0] 01/19/2012  . Essential hypertension [I10]   . Hyperlipidemia [E78.5]     Total Time spent with patient: 30 minutes Musculoskeletal: Strength & Muscle Tone: within normal limits Gait & Station: normal Patient leans: N/A  Psychiatric Specialty Exam: Physical Exam  Constitutional: He is oriented to person, place, and time. He appears well-developed and well-nourished.  HENT:  Head: Normocephalic.  Neck: Normal range of motion.  Respiratory: Effort normal.  Musculoskeletal: Normal range of motion.  Neurological: He is alert and oriented to person, place, and time.  Psychiatric: He has a normal mood and affect. His speech is normal and behavior is normal. Judgment and thought content normal. Cognition and memory are normal.    Review of Systems  Psychiatric/Behavioral: Positive for substance abuse.  All other systems reviewed and are negative.   Blood pressure 141/75, pulse 69, temperature 97.8 F (36.6 C), temperature source Oral, resp. rate 18, SpO2 97 %.There is no height or weight on file to calculate BMI.  General Appearance: Casual  Eye  Contact:  Good  Speech:  Normal Rate  Volume:  Normal  Mood:  Euthymic  Affect:  Congruent  Thought Process:  Coherent and Descriptions of Associations: Intact  Orientation:  Full (Time, Place, and Person)  Thought Content:  WDL and Logical  Suicidal Thoughts:  No  Homicidal Thoughts:  No  Memory:  Immediate;   Good Recent;   Good Remote;   Good  Judgement:  Fair  Insight:  Fair  Psychomotor Activity:  Normal  Concentration:  Concentration: Good and Attention Span: Good  Recall:  Good  Fund of Knowledge:  Fair  Language:  Good  Akathisia:  No  Handed:  Right  AIMS (if indicated):     Assets:  Housing Leisure Time Physical Health Resilience Social Support  ADL's:  Intact  Cognition:  WNL  Sleep:       Mental Status Per Nursing Assessment::   On Admission:   alcohol intoxication with hallucinations  Demographic Factors:  Male, Caucasian and Living alone  Loss Factors: NA  Historical Factors: NA  Risk Reduction Factors:   Sense of responsibility to family and Positive social support  Continued Clinical Symptoms:  None  Cognitive Features That Contribute To Risk:  None    Suicide Risk:  Minimal: No identifiable suicidal ideation.  Patients presenting with no risk factors but with morbid ruminations; may be classified as minimal risk based on the severity of the depressive symptoms  Follow-up Information    [facility] on 05/21/2016.   Specialty:  Behavioral Health Why:  New patients are seen at the walk-in clinic every Tuesday from 9:00 am - 12:00 pm. Contact information: [place] [phone]           Plan Of Care/Follow-up recommendations:  Activity:  as tolerated Diet:  heart healthy diet  Danyela Posas, NP 05/18/2016, 11:33 AM

## 2016-05-18 NOTE — Progress Notes (Signed)
CSW filed patient's notice of commitment change paperwork into IVC logbook.    Abundio Miu, [facility]  Clinical Social Worker [phone]

## 2016-05-18 NOTE — ED Notes (Signed)
Patient discharged to home.  All belongings returned and signed for.  He denies thoughts of harm to self or others.  States he is connected with [facility] and will be following up with them.  He left the unit ambulatory and was escorted to the front lobby.  Medications stored in the pharmacy were returned and signed for.

## 2016-05-18 NOTE — Discharge Instructions (Signed)
For your ongoing mental health and substance abuse needs, you are advised to follow up with [facility]. New patients are seen at the walk-in clinic every Tuesday from 9:00 am - 12:00 pm.  [facility] ([facility]) [address], [place]. [place], [place] [address] [phone]

## 2016-05-18 NOTE — Consult Note (Signed)
[facility] Consult   Reason for Consult:  Alcohol intoxication with hallucinations Referring Physician:  EDP Patient Identification: Mark [name] MRN:  [id number] Principal Diagnosis: Bipolar affective disorder, depressed, severe, with psychotic behavior ([place]) Diagnosis:   Patient Active Problem List   Diagnosis Date Noted  . Bipolar affective disorder, depressed, severe, with psychotic behavior ([place]) [F31.5] 05/16/2016    Priority: High  . Alcohol use disorder, severe, dependence ([place]) [F10.20]     Priority: High  . Opiate use [F11.90] 05/14/2016  . Occipital headache(B)((R>L) [R51] 04/16/2016  . Musculoskeletal pain [M79.1] 04/15/2016  . 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 Major depressive disorder, recurrent episode, moderate ([place]) [F33.1] 04/10/2016  . Long term current use of opiate analgesic [Z79.891] 03/18/2016  . Long term prescription opiate use [Z79.891] 03/18/2016  . History of fusion of cervical spine [Z98.1] 03/18/2016  . Chronic neck pain(4)(B)(R>L) [M54.2, G89.29] 03/18/2016  . Cervical fusion syndrome [Q76.1] 03/18/2016  . Failed back surgical syndrome [M96.1] 03/18/2016  . Chronic pain of both knees(6)(B)(R>L) [M25.561, M25.562, G89.29] 03/18/2016  . GAD (generalized anxiety disorder) [F41.1] 12/06/2015  . Alcohol use disorder, severe, in early remission ([place]) [F10.21] 12/06/2015  . Suicidal ideation [R45.851] 11/07/2015  . Bipolar affective disorder, currently depressed, moderate ([place]) [F31.32]   . Non-sustained ventricular tachycardia ([place]) [I47.2]   . GERD (gastroesophageal reflux disease) [K21.9] 07/24/2015  . Chronic anxiety [F41.9] 12/19/2014  . Tender prostate [N42.81] 09/16/2014  . Erectile dysfunction [N52.9] 09/16/2014  . Poor dentition [K08.9] 07/28/2014  . Chronic maxillary sinusitis [J32.0] 07/28/2014  . Vitamin D insufficiency [E55.9] 07/05/2014  . Closed fracture of 5th metacarpal [S62.308A]  07/04/2014  . Chronic fatigue [R53.82] 07/04/2014  . Chronic radicular low back pain (Location of Tertiary source of pain) (Right) (to calf) [M54.16, G89.29] 06/02/2014  . Chronic right shoulder pain(5)(R) [M25.511, G89.29] 05/12/2014  . Bipolar depression ([place]) [F31.30] 02/08/2014  . Neuropathy, Peripheral (Feet) ([name]) (Location of Primary Source of Pain) (Bilateral) (R>L) [G62.9] 02/08/2014  . Long Q-T syndrome [I45.81] 01/23/2014  . Hypokalemia [E87.6]   . Opioid dependence ([place]) [F11.20] 01/22/2014    Class: Acute  . Paroxysmal VT ([place]) [I47.2] 12/19/2013  . Chronic low back pain (Location of Secondary source of pain) (Bilateral) (R>L) [M54.42, M54.41, G89.29] 12/28/2012  . Chronic pain syndrome [G89.4] 12/28/2012  . Alcohol intoxication ([place]) [F10.929] 06/09/2012  . 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 Nonsustained ventricular tachycardia ([place]) [I47.2] 01/20/2012  . Tobacco abuse [Z72.0] 01/19/2012  . Essential hypertension [I10]   . Hyperlipidemia [E78.5]     Total Time spent with patient: 30 minutes  Subjective:   Mark [name] is a 51 y.o. male patient has stabilized.  HPI:  51 yo male who presented to the ED with alcohol intoxication and hallucinations.  Alcohol detox protocol started and completed along with other medications.  He stabilized, no suicidal/homicidal ideations, hallucinations, or withdrawal symptoms.  His daughter is having her 3D ultrasound today and he would like to leave to be present.  He reports being clean until recently.  Past Psychiatric History: alcohol abuse  Risk to Self: Suicidal Ideation: No Suicidal Intent: No Is patient at risk for suicide?: No Suicidal Plan?: No Access to Means: No What has been your use of drugs/alcohol within the last 12 months?: Alcohol use reported.  How many times?: 0 Other Self Harm Risks: Pt denies  Triggers for Past Attempts: None known Intentional Self Injurious Behavior:  None Risk to Others: Homicidal Ideation:  No Thoughts of Harm to Others: No Current Homicidal Intent: No Current Homicidal Plan: No Access to Homicidal Means: No Identified Victim: N/A History of harm to others?: No Assessment of Violence: None Noted Violent Behavior Description: No violent behaviors observed. Pt is calm and cooperative at this time.  Does patient have access to weapons?: No Criminal Charges Pending?: No Does patient have a court date: No Prior Inpatient Therapy: Prior Inpatient Therapy: Yes Prior Therapy Dates: 2015 Prior Therapy Facilty/Provider(s): [facility]  Reason for Treatment: Detox  Prior Outpatient Therapy: Prior Outpatient Therapy: Yes Prior Therapy Dates: Current  Prior Therapy Facilty/Provider(s): [facility] Reason for Treatment: Substance abuse  Does patient have an ACCT team?: No Does patient have Intensive In-House Services?  : No Does patient have [facility] services? : No Does patient have P4CC services?: No  Past Medical History:  Past Medical History:  Diagnosis Date  . Alcohol withdrawal ([place])   . Allergy   . Anxiety Dx 2014  . COPD (chronic obstructive pulmonary disease) ([place])   . Drug abuse    pt reports opioid dependence due to previous back surgeries  . Enlarged prostate   . ETOH abuse   . GERD (gastroesophageal reflux disease) Dx 2003  . Headache(784.0)   . Hepatitis   . Hyperlipidemia Dx 2000  . Hypertension Dx 2011  . IBS (irritable bowel syndrome)    alternates constiaption/ diarrhea  . Irregular heart beat   . 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 Mental disorder   . Neuromuscular disorder ([place])    hands hurt   . Neuropathy ([place]) 06/07/2013  . Seizure due to alcohol withdrawal ([place]) 01/23/2014   Per patient report  . Shortness of breath   . Withdrawal seizures ([place]) 2014   last seizure 2014 etoh with drawal    Past Surgical History:  Procedure Laterality Date  . [place] STUDY N/A 01/16/2015   Procedure: [place] STUDY;   Surgeon: Manus Gunning, MD;  Location: [facility] ENDOSCOPY;  Service: Gastroenterology;  Laterality: N/A;  . [place]  . COLONOSCOPY  1998  . LEFT HEART CATHETERIZATION WITH CORONARY ANGIOGRAM N/A 01/13/2014   Procedure: LEFT HEART CATHETERIZATION WITH CORONARY ANGIOGRAM;  Surgeon: Clent Demark, MD;  Location: [facility] CATH LAB;  Service: Cardiovascular;  Laterality: N/A;  . NASAL SINUS SURGERY    . NECK SURGERY  2000   cervial fusion   . NISSEN FUNDOPLICATION    . SUBACROMIAL DECOMPRESSION Right 11/16/2014   Procedure: RIGHT SHOULDER ARTHROSCOPY WITH SUBACROMIAL DECOMPRESSION ;  Surgeon: Leandrew Koyanagi, MD;  Location: [facility];  Service: Orthopedics;  Laterality: Right;  . UPPER GASTROINTESTINAL ENDOSCOPY     Family History:  Family History  Problem Relation Age of Onset  . Lung cancer Father     was a smoker  . Alcohol abuse Father   . Hypertension Mother   . Colon polyps Mother   . Breast cancer Paternal Grandmother   . Alcohol abuse Paternal Grandfather   . Colon cancer Neg Hx   . Rectal cancer Neg Hx   . Stomach cancer Neg Hx    Family Psychiatric  History: substance abuse Social History:  History  Alcohol Use No    Comment: occ; binge drinking occasionally     History  Drug Use No    Comment: Opana    Social History   Social History  . Marital status: Divorced    Spouse name: N/A  .  Number of children: 2  . Years of education: N/A   Occupational History  . [employer]     Social History Main Topics  . Smoking status: Current Every Day Smoker    Packs/day: 0.50    Years: 30.00    Types: Cigarettes  . Smokeless tobacco: Never Used     Comment: 11-30-2014 info given, Has cut back from 2 pks to 1/2 pack and "working on it"  . Alcohol use No     Comment: occ; binge drinking occasionally  . Drug use: No     Comment: Opana  . Sexual activity: Not Currently   Other Topics Concern  . None   Social History Narrative  . None    Additional Social History:    Allergies:   Allergies  Allergen Reactions  . Cozaar [Losartan] Anaphylaxis  . Lisinopril Anaphylaxis    Labs: No results found for this or any previous visit (from the past 48 hour(s)).  Current Facility-Administered Medications  Medication Dose Route Frequency Provider Last Rate Last Dose  . alum & mag hydroxide-simeth (MAALOX/MYLANTA) 200-200-20 MG/5ML suspension 30 mL  30 mL Oral PRN April Palumbo, MD      . chlordiazePOXIDE (LIBRIUM) capsule 25 mg  25 mg Oral Q6H PRN Corena Pilgrim, MD   25 mg at 05/17/16 1049  . chlordiazePOXIDE (LIBRIUM) capsule 25 mg  25 mg Oral BH-qamhs Corena Pilgrim, MD       Followed by  . [START ON 05/19/2016] chlordiazePOXIDE (LIBRIUM) capsule 25 mg  25 mg Oral Daily Mojeed Akintayo, MD      . gabapentin (NEURONTIN) capsule 300 mg  300 mg Oral TID Corena Pilgrim, MD   300 mg at 05/17/16 2120  . ibuprofen (ADVIL,MOTRIN) tablet 600 mg  600 mg Oral Q8H PRN April Palumbo, MD   600 mg at 05/17/16 1600  . metoprolol tartrate (LOPRESSOR) tablet 50 mg  50 mg Oral BID Corena Pilgrim, MD   50 mg at 05/17/16 2120  . multivitamin with minerals tablet 1 tablet  1 tablet Oral Daily Corena Pilgrim, MD   1 tablet at 05/17/16 0949  . thiamine (VITAMIN B-1) tablet 100 mg  100 mg Oral Daily Corena Pilgrim, MD   100 mg at 05/17/16 0948  . traZODone (DESYREL) tablet 100 mg  100 mg Oral QHS Corena Pilgrim, MD   100 mg at 05/17/16 2120   Current Outpatient Prescriptions  Medication Sig Dispense Refill  . gabapentin (NEURONTIN) 800 MG tablet Take 1 tablet (800 mg total) by mouth every 6 (six) hours. 120 tablet 0  . metoprolol (LOPRESSOR) 50 MG tablet Take 1 tablet (50 mg total) by mouth 2 (two) times daily. 60 tablet 11  . cyclobenzaprine (FLEXERIL) 10 MG tablet Take 1 tablet (10 mg total) by mouth at bedtime as needed for muscle spasms. (Patient not taking: Reported on 05/15/2016) 30 tablet 0  . dicyclomine (BENTYL) 20 MG tablet Take 1  tablet (20 mg total) by mouth 3 (three) times daily as needed for spasms. (Patient not taking: Reported on 05/15/2016) 90 tablet 1  . tadalafil (CIALIS) 20 MG tablet Take 0.5-1 tablets (10-20 mg total) by mouth every other day as needed for erectile dysfunction. (Patient not taking: Reported on 05/15/2016) 30 tablet 3  . traMADol (ULTRAM) 50 MG tablet Take 1-2 tablets (50-100 mg total) by mouth 4 (four) times daily. 240 tablet 0  . traZODone (DESYREL) 50 MG tablet Take 1 tablet (50 mg total) by mouth at bedtime as needed for sleep. (  Patient not taking: Reported on 05/15/2016) 30 tablet 1  . VENTOLIN HFA 108 (90 Base) MCG/ACT inhaler INHALE 2 PUFFS INTO THE LUNGS EVERY 4 HOURS AS NEEDED FOR WHEEZING OR SHORTNESS OF BREATH. (Patient not taking: Reported on 05/15/2016) 18 g 0    Musculoskeletal: Strength & Muscle Tone: within normal limits Gait & Station: normal Patient leans: N/A  Psychiatric Specialty Exam: Physical Exam  Constitutional: He is oriented to person, place, and time. He appears well-developed and well-nourished.  HENT:  Head: Normocephalic.  Neck: Normal range of motion.  Respiratory: Effort normal.  Musculoskeletal: Normal range of motion.  Neurological: He is alert and oriented to person, place, and time.  Psychiatric: He has a normal mood and affect. His speech is normal and behavior is normal. Judgment and thought content normal. Cognition and memory are normal.    Review of Systems  Psychiatric/Behavioral: Positive for substance abuse.  All other systems reviewed and are negative.   Blood pressure 141/75, pulse 69, temperature 97.8 F (36.6 C), temperature source Oral, resp. rate 18, SpO2 97 %.There is no height or weight on file to calculate BMI.  General Appearance: Casual  Eye Contact:  Good  Speech:  Normal Rate  Volume:  Normal  Mood:  Euthymic  Affect:  Congruent  Thought Process:  Coherent and Descriptions of Associations: Intact  Orientation:  Full (Time, Place,  and Person)  Thought Content:  WDL and Logical  Suicidal Thoughts:  No  Homicidal Thoughts:  No  Memory:  Immediate;   Good Recent;   Good Remote;   Good  Judgement:  Fair  Insight:  Fair  Psychomotor Activity:  Normal  Concentration:  Concentration: Good and Attention Span: Good  Recall:  Good  Fund of Knowledge:  Fair  Language:  Good  Akathisia:  No  Handed:  Right  AIMS (if indicated):     Assets:  Housing Leisure Time Physical Health Resilience Social Support  ADL's:  Intact  Cognition:  WNL  Sleep:        Treatment Plan Summary: Daily contact with patient to assess and evaluate symptoms and progress in treatment, Medication management and Plan alcohol abuse with alcohol induced hallucinations:  -Crisis stabilization -Medication management:  Librium alcohol detox completed, continued Gabapentin 300 mg TID for mood stabilization and Trazodone 100 mg at bedtime for sleep -Individual and substance abuse counseling  Disposition: No evidence of imminent risk to self or others at present.    Waylan Boga, NP 05/18/2016 9:56 AM

## 2016-05-20 ENCOUNTER — Ambulatory Visit (HOSPITAL_COMMUNITY): Payer: Self-pay | Admitting: Psychiatry

## 2016-05-20 MED FILL — traZODone HCL 50 MG TABS: 50 | 30 days supply | Qty: 30 | Fill #1

## 2016-05-20 MED FILL — ?CYCLOBENZAPRINE 10 MG TABL: 10 | 30 days supply | Qty: 30 | Fill #4

## 2016-05-21 ENCOUNTER — Other Ambulatory Visit: Payer: Self-pay | Admitting: Family Medicine

## 2016-05-21 DIAGNOSIS — R062 Wheezing: Secondary | ICD-10-CM

## 2016-05-21 LAB — TOXASSURE SELECT 13 (MW), URINE

## 2016-05-21 MED FILL — traMADol HCL 50 MG TABS: 50 | 30 days supply | Qty: 240 | Fill #0

## 2016-05-22 ENCOUNTER — Ambulatory Visit (HOSPITAL_BASED_OUTPATIENT_CLINIC_OR_DEPARTMENT_OTHER): Payer: Self-pay | Admitting: Pain Medicine

## 2016-05-22 ENCOUNTER — Ambulatory Visit
Admission: RE | Admit: 2016-05-22 | Discharge: 2016-05-22 | Disposition: A | Discharge status: Home / Self Care | Payer: Self-pay | Source: Ambulatory Visit | Attending: Pain Medicine | Admitting: Pain Medicine

## 2016-05-22 ENCOUNTER — Encounter: Payer: Self-pay | Admitting: Pain Medicine

## 2016-05-22 ENCOUNTER — Other Ambulatory Visit: Payer: Self-pay | Admitting: Pain Medicine

## 2016-05-22 VITALS — BP 136/92 | HR 67 | Temp 98.1°F | Resp 20 | Ht 76.0 in | Wt 220.0 lb

## 2016-05-22 DIAGNOSIS — R52 Pain, unspecified: Secondary | ICD-10-CM

## 2016-05-22 DIAGNOSIS — G8929 Other chronic pain: Secondary | ICD-10-CM

## 2016-05-22 DIAGNOSIS — M961 Postlaminectomy syndrome, not elsewhere classified: Secondary | ICD-10-CM

## 2016-05-22 DIAGNOSIS — M545 Low back pain: Secondary | ICD-10-CM | POA: Insufficient documentation

## 2016-05-22 DIAGNOSIS — M5416 Radiculopathy, lumbar region: Secondary | ICD-10-CM

## 2016-05-22 DIAGNOSIS — G629 Polyneuropathy, unspecified: Secondary | ICD-10-CM

## 2016-05-22 MED ORDER — DEXAMETHASONE SODIUM PHOSPHATE 4 MG/ML IJ SOLN
INTRAMUSCULAR | Status: AC
  Filled 2016-05-22: qty 2

## 2016-05-22 MED ORDER — IOPAMIDOL (ISOVUE-M 200) INJECTION 41%
INTRAMUSCULAR | Status: AC
  Filled 2016-05-22: qty 10

## 2016-05-22 MED ORDER — LACTATED RINGERS IV SOLN
1000.0000 mL | Freq: Once | INTRAVENOUS | Status: AC
  Administered 2016-05-22: 1000 mL via INTRAVENOUS

## 2016-05-22 MED ORDER — MIDAZOLAM HCL 5 MG/5ML IJ SOLN
INTRAMUSCULAR | Status: AC
  Filled 2016-05-22: qty 5

## 2016-05-22 MED ORDER — MIDAZOLAM HCL 5 MG/5ML IJ SOLN
1.0000 mg | INTRAMUSCULAR | Status: DC | PRN
  Administered 2016-05-22: 4 mg via INTRAVENOUS

## 2016-05-22 MED ORDER — LIDOCAINE HCL (PF) 1 % IJ SOLN: 10.0000 mL | Freq: Once | INTRAMUSCULAR | Status: DC

## 2016-05-22 MED ORDER — IOPAMIDOL (ISOVUE-M 200) INJECTION 41%
10.0000 mL | Freq: Once | INTRAMUSCULAR | Status: AC
  Administered 2016-05-22: 10 mL

## 2016-05-22 MED ORDER — FENTANYL CITRATE (PF) 100 MCG/2ML IJ SOLN
25.0000 ug | INTRAMUSCULAR | Status: DC | PRN
  Administered 2016-05-22: 100 ug via INTRAVENOUS

## 2016-05-22 MED ORDER — DEXAMETHASONE SODIUM PHOSPHATE 10 MG/ML IJ SOLN
10.0000 mg | Freq: Once | INTRAMUSCULAR | Status: DC
  Filled 2016-05-22: qty 1

## 2016-05-22 MED ORDER — BUPIVACAINE-EPINEPHRINE 0.25% -1:200000 IJ SOLN
10.0000 mL | Freq: Once | INTRAMUSCULAR | Status: AC
  Administered 2016-05-22: 10 mL
  Filled 2016-05-22: qty 10

## 2016-05-22 MED ORDER — FENTANYL CITRATE (PF) 100 MCG/2ML IJ SOLN
INTRAMUSCULAR | Status: AC
  Filled 2016-05-22: qty 2

## 2016-05-22 MED FILL — METOPROLOL TARTRATE 50 MG T: 50 | 30 days supply | Qty: 90 | Fill #0

## 2016-05-22 MED FILL — ?LOSARTAN POTASSIUM 50 MG T: 50 MG | 30 days supply | Qty: 30 | Fill #0

## 2016-05-22 MED FILL — VENTOLIN HFA 90 MCG INHALER: 108 (90 BAS | 30 days supply | Qty: 18 | Fill #0

## 2016-05-22 MED FILL — GEMFIBROZIL 600 MG TABLET: 600 | 30 days supply | Qty: 60 | Fill #0

## 2016-05-22 NOTE — Progress Notes (Signed)
Safety precautions to be maintained throughout the outpatient stay will include: orient to surroundings, keep bed in low position, maintain call bell within reach at all times, provide assistance with transfer out of bed and ambulation.  

## 2016-05-22 NOTE — Patient Instructions (Addendum)
Post-Procedure instructions Instructions:  Apply ice: Fill a plastic sandwich bag with crushed ice. Cover it with a small towel and apply to injection site. Apply for 15 minutes then remove x 15 minutes. Repeat sequence on day of procedure, until you go to bed. The purpose is to minimize swelling and discomfort after procedure.  Apply heat: Apply heat to procedure site starting the day following the procedure. The purpose is to treat any soreness and discomfort from the procedure.  Food intake: Start with clear liquids (like water) and advance to regular food, as tolerated.   Physical activities: Keep activities to a minimum for the first 8 hours after the procedure.   Driving: If you have received any sedation, you are not allowed to drive for 24 hours after your procedure.  Blood thinner: Restart your blood thinner 6 hours after your procedure. (Only for those taking blood thinners)  Insulin: As soon as you can eat, you may resume your normal dosing schedule. (Only for those taking insulin)  Infection prevention: Keep procedure site clean and dry.  Post-procedure Pain Diary: Extremely important that this be done correctly and accurately. Recorded information will be used to determine the next step in treatment.  Pain evaluated is that of treated area only. Do not include pain from an untreated area.  Complete every hour, on the hour, for the initial 8 hours. Set an alarm to help you do this part accurately.  Do not go to sleep and have it completed later. It will not be accurate.  Follow-up appointment: Keep your follow-up appointment after the procedure. Usually 2 weeks for most procedures. (6 weeks in the case of radiofrequency.) Bring you pain diary.  Expect:  From numbing medicine (AKA: Local Anesthetics): Numbness or decrease in pain.  Onset: Full effect within 15 minutes of injected.  Duration: It will depend on the type of local anesthetic used. On the average, 1 to 8  hours.   From steroids: Decrease in swelling or inflammation. Once inflammation is improved, relief of the pain will follow.  Onset of benefits: Depends on the amount of swelling present. The more swelling, the longer it will take for the benefits to be seen.   Duration: Steroids will stay in the system x 2 weeks. Duration of benefits will depend on multiple posibilities including persistent irritating factors.  From procedure: Some discomfort is to be expected once the numbing medicine wears off. This should be minimal if ice and heat are applied as instructed. Call if:  You experience numbness and weakness that gets worse with time, as opposed to wearing off.  New onset bowel or bladder incontinence. (Spinal procedures only)  Emergency Numbers:  Durning business hours (Monday - Thursday, 8:00 AM - 4:00 PM) (Friday, 9:00 AM - 12:00 Noon): [phone]  After hours: [phone]   __________________________________________________________________________________________   Pain Management Discharge Instructions  General Discharge Instructions :  If you need to reach your doctor call: Monday-Friday 8:00 am - 4:00 pm at [phone] or toll free [phone].  After clinic hours [phone] to have operator reach doctor.  Bring all of your medication bottles to all your appointments in the pain clinic.  To cancel or reschedule your appointment with Pain Management please remember to call 24 hours in advance to avoid a fee.  Refer to the educational materials which you have been given on: General Risks, I had my Procedure. Discharge Instructions, Post Sedation.  Post Procedure Instructions:  The drugs you were given will stay in   your system until tomorrow, so for the next 24 hours you should not drive, make any legal decisions or drink any alcoholic beverages.  You may eat anything you prefer, but it is better to start with liquids then soups and crackers, and gradually  work up to solid foods.  Please notify your doctor immediately if you have any unusual bleeding, trouble breathing or pain that is not related to your normal pain.  Depending on the type of procedure that was done, some parts of your body may feel week and/or numb.  This usually clears up by tonight or the next day.  Walk with the use of an assistive device or accompanied by an adult for the 24 hours.  You may use ice on the affected area for the first 24 hours.  Put ice in a Ziploc bag and cover with a towel and place against area 15 minutes on 15 minutes off.  You may switch to heat after 24 hours. 

## 2016-05-22 NOTE — Progress Notes (Signed)
Patient's Name: Mark [name]  MRN: [id number]  Referring Provider: Milinda Pointer, MD  DOB: [birth year]  PCP: Boykin Nearing, MD  DOS: 05/22/2016  Note by: Kathlen Brunswick. Dossie Arbour, MD  Service setting: Ambulatory outpatient  Location: [facility] (AMB) Pain Management Facility  Visit type: Procedure  Specialty: Interventional Pain Management  Patient type: Established   Primary Reason for Visit: Interventional Pain Management Treatment. CC: Back Pain (lower)  Procedure:  Anesthesia, Analgesia, Anxiolysis:  Type: Diagnostic Lumbar Sympathetic Block Region:Lumbosacral Level: L3 & L4 Laterality: Right Paravertebral  Type: Local Anesthesia with Moderate (Conscious) Sedation Local Anesthetic: Lidocaine 1% Route: Intravenous (IV) IV Access: Secured Sedation: Meaningful verbal contact was maintained at all times during the procedure  Indication(s): Analgesia and Anxiety  Indications: 1. Neuropathy, Peripheral (Feet) ([name]) (Location of Primary Source of Pain) (Bilateral) (R>L)   2. Chronic radicular low back pain (Location of Tertiary source of pain) (Right) (to calf)   3. Failed back surgical syndrome    Pain Score: Pre-procedure: 4 /10 Temp: 83.4 (F) Post-procedure: 2 /10 Temp: 81/6 (F)  Pre-op Assessment:  Previous date of service: 05/14/16 Service provided: Med Refill Mark [name] is a 51 y.o. (year old), male patient, seen today for interventional treatment. He  has a past surgical history that includes Back surgery (1995, 1999); Neck surgery (2000); Nasal sinus surgery; left heart catheterization with coronary angiogram (N/A, 01/13/2014); Subacromial decompression (Right, 11/16/2014); Nissen fundoplication; 24 hour ph study (N/A, 01/16/2015); Upper gastrointestinal endoscopy; and Colonoscopy (1998). His primarily concern today is the Back Pain (lower)  Initial Vital Signs: Blood pressure 135/84, pulse 67, temperature 98.1 F (36.7 C), temperature source Oral, resp. rate 12, height 6\' 4"  (1.93  m), weight 220 lb (99.8 kg), SpO2 94 %. BMI: 26.78 kg/m  Risk Assessment: Allergies: Reviewed. He is allergic to cozaar [losartan] and lisinopril.  Allergy Precautions: None required Coagulopathies: "Reviewed. None identified.  Blood-thinner therapy: None at this time Active Infection(s): Reviewed. None identified. Mark [name] is afebrile  Site Confirmation: Mark [name] was asked to confirm the procedure and laterality before marking the site Procedure checklist: Completed Consent: Before the procedure and under the influence of no sedative(s), amnesic(s), or anxiolytics, the patient was informed of the treatment options, risks and possible complications. To fulfill our ethical and legal obligations, as recommended by the American Medical Association's Code of Ethics, I have informed the patient of my clinical impression; the nature and purpose of the treatment or procedure; the risks, benefits, and possible complications of the intervention; the alternatives, including doing nothing; the risk(s) and benefit(s) of the alternative treatment(s) or procedure(s); and the risk(s) and benefit(s) of doing nothing. The patient was provided information about the general risks and possible complications associated with the procedure. These may include, but are not limited to: failure to achieve desired goals, infection, bleeding, organ or nerve damage, allergic reactions, paralysis, and death. In addition, the patient was informed of those risks and complications associated to Spine-related procedures, such as failure to decrease pain; infection (i.e.: Meningitis, epidural or intraspinal abscess); bleeding (i.e.: epidural hematoma, subarachnoid hemorrhage, or any other type of intraspinal or peri-dural bleeding); organ or nerve damage (i.e.: Any type of peripheral nerve, nerve root, or spinal cord injury) with subsequent damage to sensory, motor, and/or autonomic systems, resulting in permanent pain,  numbness, and/or weakness of one or several areas of the body; allergic reactions; (i.e.: anaphylactic reaction); and/or death. Furthermore, the patient was informed of those risks and complications associated with the medications. These include, but  are not limited to: allergic reactions (i.e.: anaphylactic or anaphylactoid reaction(s)); adrenal axis suppression; blood sugar elevation that in diabetics may result in ketoacidosis or comma; water retention that in patients with history of congestive heart failure may result in shortness of breath, pulmonary edema, and decompensation with resultant heart failure; weight gain; swelling or edema; medication-induced neural toxicity; particulate matter embolism and blood vessel occlusion with resultant organ, and/or nervous system infarction; and/or aseptic necrosis of one or more joints. Finally, the patient was informed that Medicine is not an exact science; therefore, there is also the possibility of unforeseen or unpredictable risks and/or possible complications that may result in a catastrophic outcome. The patient indicated having understood very clearly. We have given the patient no guarantees and we have made no promises. Enough time was given to the patient to ask questions, all of which were answered to the patient's satisfaction. Mark [name] has indicated that he wanted to continue with the procedure. Attestation: I, the ordering provider, attest that I have discussed with the patient the benefits, risks, side-effects, alternatives, likelihood of achieving goals, and potential problems during recovery for the procedure that I have provided informed consent. Date: 05/22/2016; Time: 12:12 PM  Pre-Procedure Preparation:  Monitoring: As per clinic protocol. Respiration, ETCO2, SpO2, BP, heart rate and rhythm monitor placed and checked for adequate function Safety Precautions: Patient was assessed for positional comfort and pressure points before starting  the procedure. Time-out: I initiated and conducted the "Time-out" before starting the procedure, as per protocol. The patient was asked to participate by confirming the accuracy of the "Time Out" information. Verification of the correct person, site, and procedure were performed and confirmed by me, the nursing staff, and the patient. "Time-out" conducted as per Joint Commission's Universal Protocol (UP.01.01.01). "Time-out" Date & Time: 05/22/2016; 1148 hrs.  Description of Procedure Process:  Position: Prone with head of the table was raised to facilitate breathing. Target Area: For Lumbar Sympathetic Block(s), the target is the anterolateral aspect of the L3 & L4 vertebral bodies, where the lumbar sympathetic chain resides. Approach: Paravertebral, ipsilateral approach. Area Prepped: Entire Posterior Thoracolumbar Region Prepping solution:  ChloraPrep (2% chlorhexidine gluconate and 70% isopropyl alcohol) Safety Precautions: Aspiration looking for blood return was conducted prior to all injections. At no point did we inject any substances, as a needle was being advanced. No attempts were made at seeking any paresthesias. Safe injection practices and needle disposal techniques used. Medications properly checked for expiration dates. SDV (single dose vial) medications used. Description of the Procedure: Protocol guidelines were followed. The patient was placed in position over the procedure table. The target area was identified and the area prepped in the usual manner. Skin & deeper tissues infiltrated with local anesthetic. Appropriate amount of time allowed to pass for local anesthetics to take effect. The procedure needles were then advanced to the target area, the superior anterolateral border of the L3 vertebral body, under pulsed fluoroscopic guidance. Care was taken not to advance the tip of the needle past the anterior border of the vertebral body, on the lateral fluoroscopic view. Proper needle  placement secured. Negative aspiration confirmed. Solution injected in intermittent fashion, asking for systemic symptoms every 0.5cc of injectate. The needles were then removed and the area cleansed, making sure to leave some of the prepping solution back to take advantage of its long term bactericidal properties. Vitals:   05/22/16 1206 05/22/16 1214 05/22/16 1224 05/22/16 1236  BP: 135/84 (!) 135/99 (!) 150/109 (!) 136/92  Pulse:  Resp: 12 18 (!) 21 20  Temp:      TempSrc: Other (Comment)  Other (Comment)   SpO2: 94% 94% 95% 96%  Weight:      Height:        Start Time: 1149 hrs. End Time: 1204 hrs. Materials:  Needle(s) Type: Regular needle Gauge: 22G Length: 3.5-in Medication(s): We administered fentaNYL, lactated ringers, midazolam, bupivacaine-EPINEPHrine, and iopamidol. Please see chart orders for dosing details.  Imaging Guidance (Spinal):  Type of Imaging Technique: Fluoroscopy Guidance (Spinal) Indication(s): Assistance in needle guidance and placement for procedures requiring needle placement in or near specific anatomical locations not easily accessible without such assistance. Exposure Time: Please see nurses notes. Contrast: Before injecting any contrast, we confirmed that the patient did not have an allergy to iodine, shellfish, or radiological contrast. Once satisfactory needle placement was completed at the desired level, radiological contrast was injected. Contrast injected under live fluoroscopy. No contrast complications. See chart for type and volume of contrast used. Fluoroscopic Guidance: I was personally present during the use of fluoroscopy. "Tunnel Vision Technique" used to obtain the best possible view of the target area. Parallax error corrected before commencing the procedure. "Direction-depth-direction" technique used to introduce the needle under continuous pulsed fluoroscopy. Once target was reached, antero-posterior, oblique, and lateral fluoroscopic  projection used confirm needle placement in all planes. Images permanently stored in EMR. Interpretation: I personally interpreted the imaging intraoperatively. Adequate needle placement confirmed in multiple planes. Appropriate spread of contrast into desired area was observed. No evidence of afferent or efferent intravascular uptake. No intrathecal or subarachnoid spread observed. Permanent images saved into the patient's record.  Antibiotic Prophylaxis:  Indication(s): None identified Antibiotic given: None  Post-operative Assessment:  EBL: None Complications: No immediate post-treatment complications observed by team, or reported by patient. Note: The patient tolerated the entire procedure well. A repeat set of vitals were taken after the procedure and the patient was kept under observation following institutional policy, for this type of procedure. Post-procedural neurological assessment was performed, showing return to baseline, prior to discharge. The patient was provided with post-procedure discharge instructions, including a section on how to identify potential problems. Should any problems arise concerning this procedure, the patient was given instructions to immediately contact us, at any time, without hesitation. In any case, we plan to contact the patient by telephone for a follow-up status report regarding this interventional procedure. Comments:  No additional relevant information.  Plan of Care  Disposition: Discharge home  Discharge Date & Time: 05/22/2016; 1240 hrs.  Physician-requested Follow-up:  Return in about 2 weeks (around 06/05/2016) for Post-Procedure evaluation.  Future Appointments Date Time Provider [place]  05/30/2016 12:00 PM Jerel Shepherd, LCSW [facility] None  06/06/2016 11:15 AM Milinda Pointer, MD [facility] None   Medications ordered for procedure: Meds ordered this encounter  Medications  . fentaNYL (SUBLIMAZE) injection 25-50 mcg    Make  sure Narcan is available in the pyxis when using this medication. In the event of respiratory depression (RR< 8/min): Titrate NARCAN (naloxone) in increments of 0.1 to 0.2 mg IV at 2-3 minute intervals, until desired degree of reversal.  . lactated ringers infusion 1,000 mL  . midazolam (VERSED) 5 MG/5ML injection 1-2 mg    Make sure Flumazenil is available in the pyxis when using this medication. If oversedation occurs, administer 0.2 mg IV over 15 sec. If after 45 sec no response, administer 0.2 mg again over 1 min; may repeat at 1 min intervals; not to exceed  4 doses (1 mg)  . bupivacaine-EPINEPHrine (MARCAINE W/ EPI) 0.25% -1:200000 (with pres) injection 10 mL  . dexamethasone (DECADRON) injection 10 mg  . iopamidol (ISOVUE-M) 41 % intrathecal injection 10 mL  . lidocaine (PF) (XYLOCAINE) 1 % injection 10 mL   Medications administered: We administered fentaNYL, lactated ringers, midazolam, bupivacaine-EPINEPHrine, and iopamidol.  See the medical record for exact dosing, route, and time of administration.  Lab-work, Procedure(s), & Referral(s) Ordered: Orders Placed This Encounter  Procedures  . DG C-Arm 1-60 Min-No Report  . Discharge instructions  . Follow-up  . Informed Consent Details: Transcribe to consent form and obtain patient signature  . Provider attestation of informed consent for procedure/surgical case  . Verify informed consent   Imaging Ordered: Results for orders placed in visit on 04/22/16  DG C-Arm 1-60 Min-No Report   Narrative Fluoroscopy was utilized by the requesting physician.  No radiographic  interpretation.    New Prescriptions   No medications on file   Primary Care Physician: Boykin Nearing, MD Location: [facility] Note by: Kathlen Brunswick. Dossie Arbour, M.D, DABA, DABAPM, DABPM, DABIPP, FIPP Date: 05/22/2016; Time: 1:16 PM  Disclaimer:  Medicine is not an exact science. The only guarantee in medicine is that nothing is  guaranteed. It is important to note that the decision to proceed with this intervention was based on the information collected from the patient. The Data and conclusions were drawn from the patient's questionnaire, the interview, and the physical examination. Because the information was provided in large part by the patient, it cannot be guaranteed that it has not been purposely or unconsciously manipulated. Every effort has been made to obtain as much relevant data as possible for this evaluation. It is important to note that the conclusions that lead to this procedure are derived in large part from the available data. Always take into account that the treatment will also be dependent on availability of resources and existing treatment guidelines, considered by other Pain Management Practitioners as being common knowledge and practice, at the time of the intervention. For Medico-Legal purposes, it is also important to point out that variation in procedural techniques and pharmacological choices are the acceptable norm. The indications, contraindications, technique, and results of the above procedure should only be interpreted and judged by a Board-Certified Interventional Pain Specialist with extensive familiarity and expertise in the same exact procedure and technique. Attempts at providing opinions without similar or greater experience and expertise than that of the treating physician will be considered as inappropriate and unethical, and shall result in a formal complaint to the state medical board and applicable specialty societies.  Instructions provided at this appointment: Patient Instructions  Post-Procedure instructions Instructions:  Apply ice: Fill a plastic sandwich bag with crushed ice. Cover it with a small towel and apply to injection site. Apply for 15 minutes then remove x 15 minutes. Repeat sequence on day of procedure, until you go to bed. The purpose is to minimize swelling and discomfort  after procedure.  Apply heat: Apply heat to procedure site starting the day following the procedure. The purpose is to treat any soreness and discomfort from the procedure.  Food intake: Start with clear liquids (like water) and advance to regular food, as tolerated.   Physical activities: Keep activities to a minimum for the first 8 hours after the procedure.   Driving: If you have received any sedation, you are not allowed to drive for 24 hours after your procedure.  Blood thinner: Restart  your blood thinner 6 hours after your procedure. (Only for those taking blood thinners)  Insulin: As soon as you can eat, you may resume your normal dosing schedule. (Only for those taking insulin)  Infection prevention: Keep procedure site clean and dry.  Post-procedure Pain Diary: Extremely important that this be done correctly and accurately. Recorded information will be used to determine the next step in treatment.  Pain evaluated is that of treated area only. Do not include pain from an untreated area.  Complete every hour, on the hour, for the initial 8 hours. Set an alarm to help you do this part accurately.  Do not go to sleep and have it completed later. It will not be accurate.  Follow-up appointment: Keep your follow-up appointment after the procedure. Usually 2 weeks for most procedures. (6 weeks in the case of radiofrequency.) Bring you pain diary.  Expect:  From numbing medicine (AKA: Local Anesthetics): Numbness or decrease in pain.  Onset: Full effect within 15 minutes of injected.  Duration: It will depend on the type of local anesthetic used. On the average, 1 to 8 hours.   From steroids: Decrease in swelling or inflammation. Once inflammation is improved, relief of the pain will follow.  Onset of benefits: Depends on the amount of swelling present. The more swelling, the longer it will take for the benefits to be seen.   Duration: Steroids will stay in the system x 2 weeks.  Duration of benefits will depend on multiple posibilities including persistent irritating factors.  From procedure: Some discomfort is to be expected once the numbing medicine wears off. This should be minimal if ice and heat are applied as instructed. Call if:  You experience numbness and weakness that gets worse with time, as opposed to wearing off.  New onset bowel or bladder incontinence. (Spinal procedures only)  Emergency Numbers:  Durning business hours (Monday - Thursday, 8:00 AM - 4:00 PM) (Friday, 9:00 AM - 12:00 Noon): [phone]  After hours: [phone]   __________________________________________________________________________________________   Pain Management Discharge Instructions  General Discharge Instructions :  If you need to reach your doctor call: Monday-Friday 8:00 am - 4:00 pm at [phone] or toll free [phone].  After clinic hours [phone] to have operator reach doctor.  Bring all of your medication bottles to all your appointments in the pain clinic.  To cancel or reschedule your appointment with Pain Management please remember to call 24 hours in advance to avoid a fee.  Refer to the educational materials which you have been given on: General Risks, I had my Procedure. Discharge Instructions, Post Sedation.  Post Procedure Instructions:  The drugs you were given will stay in your system until tomorrow, so for the next 24 hours you should not drive, make any legal decisions or drink any alcoholic beverages.  You may eat anything you prefer, but it is better to start with liquids then soups and crackers, and gradually work up to solid foods.  Please notify your doctor immediately if you have any unusual bleeding, trouble breathing or pain that is not related to your normal pain.  Depending on the type of procedure that was done, some parts of your body may feel week and/or numb.  This usually clears up by tonight or the next  day.  Walk with the use of an assistive device or accompanied by an adult for the 24 hours.  You may use ice on the affected area for the first 24 hours.  Put  ice in a Ziploc bag and cover with a towel and place against area 15 minutes on 15 minutes off.  You may switch to heat after 24 hours.

## 2016-05-23 ENCOUNTER — Telehealth: Payer: Self-pay | Admitting: *Deleted

## 2016-05-23 NOTE — Telephone Encounter (Signed)
Denies problems post procedure. 

## 2016-05-30 ENCOUNTER — Encounter (HOSPITAL_COMMUNITY): Payer: Self-pay | Admitting: Clinical

## 2016-05-30 ENCOUNTER — Ambulatory Visit (INDEPENDENT_AMBULATORY_CARE_PROVIDER_SITE_OTHER): Payer: Self-pay | Admitting: Clinical

## 2016-05-30 DIAGNOSIS — F3130 Bipolar disorder, current episode depressed, mild or moderate severity, unspecified: Secondary | ICD-10-CM

## 2016-05-30 DIAGNOSIS — F1021 Alcohol dependence, in remission: Secondary | ICD-10-CM

## 2016-05-30 DIAGNOSIS — F319 Bipolar disorder, unspecified: Secondary | ICD-10-CM

## 2016-05-30 MED FILL — GABAPENTIN 800 MG TABLET: 800 | 30 days supply | Qty: 120 | Fill #0

## 2016-05-30 NOTE — Progress Notes (Signed)
   THERAPIST PROGRESS NOTE  Session Time: 11:51 - 12:40   Participation Level: Active  Behavioral Response: CasualAlertDepressed  Type of Therapy: Individual therapy  Treatment Goals addressed: Improve psychiatric symptoms, elevate mood,  Mood stabilization , stress management skills, healthy coping skills  Interventions: motivational interviewing, cbt, grounding and mindfulness techniques, psychoeducation  Summary: Mark [name] is a 51 y.o. 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 Mark [name] discussed his psychiatric symptoms and his current life events. Mark [name] shared that he remains depressed. He shared that his pain  Level continues to be high. He shared that he is unable to do very much because of his pain level which increases his depression. Clinician asked open ended questions. Mark [name] shared that he spends a lot of his time watching TV. He also shared he has difficulty sleeping. Mark [name] identified some instances when he is less focussed on the pain. Mark [name] and clinician discussed how engaging in more active activities might distract him from his pain and also inprove his depression. Clinician asked open ended questions and Mark [name] identified activities he could do that would be more mindful and engaging. He shared that his daughter is about to have a baby and that he would be helping out with the baby which he is excited about and would also help take his mind off of his depression, anxiety  And focus on  pain. Clinician suggested guided meditations also which Mark [name] agreed to try.  Plan: Return in 1-2 weeks.  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

## 2016-06-06 ENCOUNTER — Ambulatory Visit: Payer: Self-pay | Admitting: Pain Medicine

## 2016-06-10 NOTE — Progress Notes (Signed)
Test: Chloride (Cl-) levels Finding(s): Low (hypochloremia) Normal Level(s): between 95 and 111 mmol/L Clinical significance: It may lead to dehydration. Signs and symptoms may include: Fluid loss, dehydration, weakness or fatigue, difficulty breathing, and/or diarrhea or vomiting. Possible causes: Addison disease; Bartter syndrome; burns; congestive heart failure; dehydration; excessive sweating; hyperaldosteronism; metabolic alkalosis; respiratory acidosis (compensated); Syndrome of inappropriate diuretic hormone secretion (SIADH); or vomiting. Patient Recommendation(s): Follow-up with primary care physician. ___________________________________________________________________________________  Test: Blood sugar (Glucose levels) Finding(s): High (hyperglycemia) Normal Level(s): Normal fasting (NPO x 8 hours) glucose levels are between 65-99 mg/dl, with 2 hour fasting, levels are usually less than 140 mg/dl. Clinical significance: Any random blood glucose level greater than 200 mg/dl is considered to be Diabetes. Signs and symptoms may include: (when persistently above 180 mg/dL) Increased thirst; headaches; trouble concentrating; blurred vision; frequent peeing (urination); fatigue (weakness and tired feeling); weight loss. The most common and classical symptoms of an undiagnosed diabetes with hyperglycemia are: Increased urinary frequency (polyuria), thirst (polydipsia), hunger (polyphagia), and unexplained weight loss; numbness in the extremities, pain in feet (dysesthesias), fatigue, and blurred vision; recurrent or severe infections; loss of consciousness or severe nausea/vomiting (ketoacidosis) or coma. Patient Recommendation(s): Fasting levels above 140 mg/dL or any levels above 200 mg/dL should follow-up with PCP (Primary Care Physician) for further evaluation. ___________________________________________________________________________________  Test: ALT (Alanine Aminotransferase) or SGPT  (Serum Glutamic Pyruvic Transaminase) level Finding(s): Low  Normal Level(s): between 17 and 63 U/L. Clinical significance: Low levels are expected and normal. Signs and symptoms may include: None Patient Recommendation(s): No further action required. ___________________________________________________________________________________

## 2016-06-13 ENCOUNTER — Encounter: Payer: Self-pay | Admitting: Pain Medicine

## 2016-06-19 NOTE — Progress Notes (Signed)
Patient's Name: Mark [name]  MRN: [id number]  Referring Provider: Boykin Nearing, MD  DOB: 1965/06/11  PCP: Boykin Nearing, MD  DOS: 06/20/2016  Note by: Kathlen Brunswick. Dossie Arbour, MD  Service setting: Ambulatory outpatient  Specialty: Interventional Pain Management  Location: [facility] (AMB) Pain Management Facility    Patient type: Established   Primary Reason(s) for Visit: Encounter for post-procedure evaluation of chronic illness with mild to moderate exacerbation CC: Foot Pain (bilateral)  HPI  Mark [name] is a 51 y.o. year old, male patient, who comes today for a post-procedure evaluation. 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 ([place]); Alcohol intoxication ([place]); Chronic low back pain (Location of Secondary source of pain) (Bilateral) (R>L); Chronic pain syndrome; Paroxysmal VT ([place]); Opioid dependence ([place]); Long Q-T syndrome; Hypokalemia; Alcohol use disorder, severe, dependence ([place]); Bipolar depression ([place]); Neuropathy, Peripheral (Feet) ([name]) (Location of Primary Source of Pain) (Bilateral) (R>L); Chronic right shoulder pain(5)(R); Chronic radicular low back pain (Location of Tertiary source of pain) (Right) (to calf); Closed fracture of 5th metacarpal; Chronic fatigue; Vitamin D insufficiency; Poor dentition; Chronic maxillary sinusitis; Tender prostate; Erectile dysfunction; Chronic anxiety; GERD (gastroesophageal reflux disease); Suicidal ideation; Bipolar affective disorder, currently depressed, moderate ([place]); Non-sustained ventricular tachycardia ([place]); GAD (generalized anxiety disorder); Alcohol use disorder, severe, in early remission ([place]); Long term current use of opiate analgesic; Long term prescription opiate use; History of fusion of cervical spine; Chronic neck pain(4)(B)(R>L); Cervical fusion syndrome; Failed back surgical syndrome; Chronic pain of both knees(6)(B)(R>L);  Major depressive disorder, recurrent episode, moderate (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 His primarily concern today is the Foot Pain (bilateral)  Pain Assessment: Self-Reported Pain Score: 4 /10 Clinically the patient looks like a 2/10 Reported level is inconsistent with clinical observations. Information on the proper use of the pain scale provided to the patient today Pain Type: Chronic pain Pain Location: Foot (low back, legs, ) Pain Orientation: Right (right legs, right lower back) Pain Descriptors / Indicators: Aching, Constant, Sharp, Burning Pain Frequency: Constant  Mark [name] comes in today for post-procedure evaluation after the treatment done on 05/22/2016.  Further details on both, my assessment(s), as well as the proposed treatment plan, please see below.  Post-Procedure Assessment  05/22/2016 Procedure: Diagnostic right L3 & L4 LSB under fluoro and IV sedation. Pre-procedure pain score:  4/10 Post-procedure pain score: 2/10 50% Influential Factors: BMI: 26.17 kg/m Intra-procedural challenges: None observed Assessment challenges: None detected         Post-procedural side-effects, adverse reactions, or complications: None reported Reported issues: None  Sedation: Sedation provided. When no sedatives are used, the analgesic levels obtained are directly associated to the effectiveness of the local anesthetics. However, when sedation is provided, the level of analgesia obtained during the initial 1 hour following the intervention, is believed to be the result of a combination of factors. These factors may include, but are not limited to: 1. The effectiveness of the local anesthetics used. 2. The effects of the analgesic(s) and/or anxiolytic(s) used. 3. The degree of discomfort experienced by the  patient at the time of the procedure. 4. The patients ability and reliability in recalling and recording the events. 5. The presence and influence of possible secondary gains and/or psychosocial factors. Reported result: Relief experienced during the 1st hour after the procedure: 50 % (Ultra-Short Term Relief) Interpretative annotation: Analgesia during  this period is likely to be Local Anesthetic and/or IV Sedative (Analgesic/Anxiolitic) related.          Effects of local anesthetic: The analgesic effects attained during this period are directly associated to the localized infiltration of local anesthetics and therefore cary significant diagnostic value as to the etiological location, or anatomical origin, of the pain. Expected duration of relief is directly dependent on the pharmacodynamics of the local anesthetic used. Long-acting (4-6 hours) anesthetics used.  Reported result: Relief during the next 4 to 6 hour after the procedure: 50 % (Short-Term Relief) Interpretative annotation: Complete relief would suggest area to be the source of the pain.          Long-term benefit: Defined as the period of time past the expected duration of local anesthetics. With the possible exception of prolonged sympathetic blockade from the local anesthetics, benefits during this period are typically attributed to, or associated with, other factors such as analgesic sensory neuropraxia, antiinflammatory effects, or beneficial biochemical changes provided by agents other than the local anesthetics Reported result: Extended relief following procedure: 50 % (lasting 5 days) (Long-Term Relief) Interpretative annotation: Good relief. This could suggest inflammation to be a significant component in the etiology to the pain.          Current benefits: Defined as persistent relief that continues at this point in time.   Reported results: Treated area: <50 %       Interpretative annotation: Ongoing benefits would suggest  effective therapeutic approach  Interpretation: Results would suggest a successful diagnostic intervention.          Laboratory Chemistry  Inflammation Markers Lab Results  Component Value Date   CRP 0.9 03/18/2016   ESRSEDRATE 2 03/18/2016   (CRP: Acute Phase) (ESR: Chronic Phase) Renal Function Markers Lab Results  Component Value Date   BUN 5 (L) 05/15/2016   CREATININE 0.72 05/15/2016   GFRAA >60 05/15/2016   GFRNONAA >60 05/15/2016   Hepatic Function Markers Lab Results  Component Value Date   AST 30 05/15/2016   ALT 40 05/15/2016   ALBUMIN 4.7 05/15/2016   ALKPHOS 87 05/15/2016   HCVAB NEGATIVE 06/14/2012   Electrolytes Lab Results  Component Value Date   NA 138 05/15/2016   K 3.4 (L) 05/15/2016   CL 101 05/15/2016   CALCIUM 9.0 05/15/2016   MG 2.1 03/18/2016   Neuropathy Markers Lab Results  Component Value Date   VITAMINB12 244 03/18/2016   Bone Pathology Markers Lab Results  Component Value Date   ALKPHOS 87 05/15/2016   VD25OH 21 (L) 07/04/2014   25OHVITD1 35 03/18/2016   25OHVITD2 1.0 03/18/2016   25OHVITD3 34 03/18/2016   CALCIUM 9.0 05/15/2016   TESTOFREE 78.9 09/16/2014   TESTOSTERONE 365 09/16/2014   Coagulation Parameters Lab Results  Component Value Date   INR 1.04 06/07/2013   LABPROT 13.4 06/07/2013   PLT 298 05/15/2016   Cardiovascular Markers Lab Results  Component Value Date   BNP 23.9 02/27/2015   HGB 17.7 (H) 05/15/2016   HCT 49.2 05/15/2016   Note: Lab results reviewed.  Recent Diagnostic Imaging Review  Dg C-arm 1-60 Min-no Report  Result Date: 05/22/2016 Fluoroscopy was utilized by the requesting physician.  No radiographic interpretation.   Note: Imaging results reviewed.          Meds  The patient has a current medication list which includes the following prescription(s): baclofen, cyclobenzaprine, gabapentin, gemfibrozil, losartan, metoprolol, tramadol, trazodone, and ventolin hfa.  Current Outpatient  Prescriptions on File Prior to Visit  Medication Sig  . gemfibrozil (LOPID) 600 MG tablet Take 600 mg by mouth 2 (two) times daily before a meal.  . losartan (COZAAR) 50 MG tablet Take 50 mg by mouth daily.  . metoprolol (LOPRESSOR) 50 MG tablet Take 1 tablet (50 mg total) by mouth 2 (two) times daily.  . VENTOLIN HFA 108 (90 Base) MCG/ACT inhaler INHALE 2 PUFFS INTO THE LUNGS EVERY 4 HOURS AS NEEDED FOR WHEEZING OR SHORTNESS OF BREATH.   No current facility-administered medications on file prior to visit.    ROS  Constitutional: Denies any fever or chills Gastrointestinal: No reported hemesis, hematochezia, vomiting, or acute GI distress Musculoskeletal: Denies any acute onset joint swelling, redness, loss of ROM, or weakness Neurological: No reported episodes of acute onset apraxia, aphasia, dysarthria, agnosia, amnesia, paralysis, loss of coordination, or loss of consciousness  Allergies  Mr. Westrup is allergic to cozaar [losartan] and lisinopril.  [place]  Drug: Mr. Leyda  reports that he does not use drugs. Alcohol:  reports that he does not drink alcohol. Tobacco:  reports that he has been smoking Cigarettes.  He has a 15.00 pack-year smoking history. He has never used smokeless tobacco. Medical:  has a past medical history of Alcohol withdrawal ([place]); Allergy; Anxiety (Dx 2014); COPD (chronic obstructive pulmonary disease) ([place]); Drug abuse; Enlarged prostate; ETOH abuse; GERD (gastroesophageal reflux disease) (Dx 2003); Headache(784.0); Hepatitis; Hyperlipidemia (Dx 2000); Hypertension (Dx 2011); IBS (irritable bowel syndrome); Irregular heart beat; Long Q-T syndrome (01/23/2014); Mental disorder; Neuromuscular disorder ([place]); Neuropathy (06/07/2013); Seizure due to alcohol withdrawal ([place]) (01/23/2014); Shortness of breath; and Withdrawal seizures ([place]) (2014). Family: family history includes Alcohol abuse in his father and paternal grandfather; Breast cancer in his paternal  grandmother; Colon polyps in his mother; Hypertension in his mother; Lung cancer in his father.  Past Surgical History:  Procedure Laterality Date  . [place] STUDY N/A 01/16/2015   Procedure: [place] STUDY;  Surgeon: Manus Gunning, MD;  Location: [facility];  Service: Gastroenterology;  Laterality: N/A;  . [place]  . COLONOSCOPY  1998  . LEFT HEART CATHETERIZATION WITH CORONARY ANGIOGRAM N/A 01/13/2014   Procedure: LEFT HEART CATHETERIZATION WITH CORONARY ANGIOGRAM;  Surgeon: Clent Demark, MD;  Location: [facility];  Service: Cardiovascular;  Laterality: N/A;  . NASAL SINUS SURGERY    . NECK SURGERY  2000   cervial fusion   . [place] FUNDOPLICATION    . SUBACROMIAL DECOMPRESSION Right 11/16/2014   Procedure: RIGHT SHOULDER ARTHROSCOPY WITH SUBACROMIAL DECOMPRESSION ;  Surgeon: Leandrew Koyanagi, MD;  Location: [place];  Service: Orthopedics;  Laterality: Right;  . UPPER GASTROINTESTINAL ENDOSCOPY     Constitutional Exam  General appearance: Well nourished, well developed, and well hydrated. In no apparent acute distress Vitals:   06/20/16 0814  BP: 136/87  Pulse: 78  Resp: 18  Temp: 98.1 F (36.7 C)  SpO2: 100%  Weight: 215 lb (97.5 kg)  Height: 6' 4" (1.93 m)   BMI Assessment: Estimated body mass index is 26.17 kg/m as calculated from the following:   Height as of this encounter: 6' 4" (1.93 m).   Weight as of this encounter: 215 lb (97.5 kg).  BMI interpretation table: BMI level Category Range association with higher incidence of chronic pain  <18 kg/m2 Underweight   18.5-24.9 kg/m2 Ideal body weight   25-29.9 kg/m2 Overweight Increased incidence by 20%  30-34.9 kg/m2 Obese (Class  I) Increased incidence by 68%  35-39.9 kg/m2 Severe obesity (Class II) Increased incidence by 136%  >40 kg/m2 Extreme obesity (Class III) Increased incidence by 254%   BMI Readings from Last 4 Encounters:  06/20/16 26.17 kg/m  05/22/16 26.78  kg/m  05/14/16 27.39 kg/m  04/22/16 27.39 kg/m   Wt Readings from Last 4 Encounters:  06/20/16 215 lb (97.5 kg)  05/22/16 220 lb (99.8 kg)  05/14/16 225 lb (102.1 kg)  04/22/16 225 lb (102.1 kg)  Psych/Mental status: Alert, oriented x 3 (person, place, & time)       Eyes: PERLA Respiratory: No evidence of acute respiratory distress  Cervical Spine Exam  Inspection: No masses, redness, or swelling Alignment: Symmetrical Functional ROM: Unrestricted ROM Stability: No instability detected Muscle strength & Tone: Functionally intact Sensory: Unimpaired Palpation: No palpable anomalies  Upper Extremity (UE) Exam    Side: Right upper extremity  Side: Left upper extremity  Inspection: No masses, redness, swelling, or asymmetry. No contractures  Inspection: No masses, redness, swelling, or asymmetry. No contractures  Functional ROM: Unrestricted ROM          Functional ROM: Unrestricted ROM          Muscle strength & Tone: Functionally intact  Muscle strength & Tone: Functionally intact  Sensory: Unimpaired  Sensory: Unimpaired  Palpation: No palpable anomalies  Palpation: No palpable anomalies  Specialized Test(s): Deferred         Specialized Test(s): Deferred          Thoracic Spine Exam  Inspection: No masses, redness, or swelling Alignment: Symmetrical Functional ROM: Unrestricted ROM Stability: No instability detected Sensory: Unimpaired Muscle strength & Tone: No palpable anomalies  Lumbar Spine Exam  Inspection: No masses, redness, or swelling Alignment: Symmetrical Functional ROM: Unrestricted ROM Stability: No instability detected Muscle strength & Tone: Functionally intact Sensory: Unimpaired Palpation: No palpable anomalies Provocative Tests: Lumbar Hyperextension and rotation test: evaluation deferred today       Patrick's Maneuver: evaluation deferred today              Gait & Posture Assessment  Ambulation: Unassisted Gait: Relatively normal for age and  body habitus Posture: WNL   Lower Extremity Exam    Side: Right lower extremity  Side: Left lower extremity  Inspection: No masses, redness, swelling, or asymmetry. No contractures  Inspection: No masses, redness, swelling, or asymmetry. No contractures  Functional ROM: Unrestricted ROM          Functional ROM: Unrestricted ROM          Muscle strength & Tone: Functionally intact  Muscle strength & Tone: Functionally intact  Sensory: Unimpaired  Sensory: Unimpaired  Palpation: No palpable anomalies  Palpation: No palpable anomalies   Assessment  Primary Diagnosis & Pertinent Problem List: The primary encounter diagnosis was Chronic pain of both knees(6)(B)(R>L). Diagnoses of Neuropathy, Peripheral (Feet) ([name]) (Location of Primary Source of Pain) (Bilateral) (R>L), Chronic radicular low back pain (Location of Tertiary source of pain) (Right) (to calf), Chronic low back pain (Location of Secondary source of pain) (Bilateral) (R>L), Musculoskeletal pain, Chronic pain syndrome, Long term prescription opiate use, and Opiate use were also pertinent to this visit.  Status Diagnosis  Not improving Not improving Not improving 1. Chronic pain of both knees(6)(B)(R>L)   2. Neuropathy, Peripheral (Feet) ([name]) (Location of Primary Source of Pain) (Bilateral) (R>L)   3. Chronic radicular low back pain (Location of Tertiary source of pain) (Right) (to calf)   4. Chronic low back  pain (Location of Secondary source of pain) (Bilateral) (R>L)   5. Musculoskeletal pain   6. Chronic pain syndrome   7. Long term prescription opiate use   8. Opiate use      Plan of Care  Pharmacotherapy (Medications Ordered): Meds ordered this encounter  Medications  . baclofen (LIORESAL) 10 MG tablet    Sig: Take 1 tablet (10 mg total) by mouth 3 (three) times daily.    Dispense:  90 tablet    Refill:  2    Do not place this medication, or any other prescription from our practice, on "Automatic Refill". Patient may  have prescription filled one day early if pharmacy is closed on scheduled refill date.  . cyclobenzaprine (FLEXERIL) 10 MG tablet    Sig: Take 1 tablet (10 mg total) by mouth at bedtime.    Dispense:  30 tablet    Refill:  2    Do not place medication on "Automatic Refill". Fill one day early if pharmacy is closed on scheduled refill date.  . traMADol (ULTRAM) 50 MG tablet    Sig: Take 2 tablets (100 mg total) by mouth 4 (four) times daily. 1-2 tablets    Dispense:  240 tablet    Refill:  2    Fill one day early if pharmacy is closed on scheduled refill date. Do not fill until: 06/20/16 To last until: 09/18/16  . gabapentin (NEURONTIN) 800 MG tablet    Sig: Take 1 tablet (800 mg total) by mouth 4 (four) times daily.    Dispense:  120 tablet    Refill:  2    Do not place medication on "Automatic Refill". Fill one day early if pharmacy is closed on scheduled refill date.   New Prescriptions   BACLOFEN (LIORESAL) 10 MG TABLET    Take 1 tablet (10 mg total) by mouth 3 (three) times daily.   Medications administered today: Mr. Poorman had no medications administered during this visit. Lab-work, procedure(s), and/or referral(s): Orders Placed This Encounter  Procedures  . LUMBAR SYMPATHETIC BLOCK   Imaging and/or referral(s): None  Interventional therapies: Planned, scheduled, and/or pending:   Diagnostic Right LSB   Considering:   Lower extremity EMG/PNCV. (Radiculopathy versus peripheral neuropathy) Upper extremity EMG/PNCV. Diagnostic caudal epidural steroid injection +diagnostic epidurogram Possible Racz procedure Diagnostic cervical epidural steroid injection  Diagnostic bilateral cervical facet block  Possible bilateral cervical facet RFA Diagnostic bilateral lumbar facet block  Possible bilateral lumbar facet RFA  Diagnostic right intra-articular shoulder joint injection Diagnostic right suprascapular nerve block Possible right suprascapular RFA Diagnostic bilateral  intra-articular knee joint injection  Possible bilateral series of 5 Hyalgan knee injections  Diagnostic bilateral Genicular nerve block  Possible bilateral Genicular RFA    Palliative PRN treatment(s):   None at this time.    Provider-requested follow-up: Return in about 3 months (around 09/19/2016) for (Nurse Practitioner) Med-Mgmt, in addition, procedure (ASAP).  Future Appointments Date Time Provider [place]  06/26/2016 8:45 AM Milinda Pointer, MD [facility] None  07/02/2016 1:30 PM Aundra Dubin, MD [facility] None  07/11/2016 1:30 PM Jerel Shepherd, LCSW [facility] None  09/16/2016 10:00 AM [place], NP [facility] None   Primary Care Physician: Boykin Nearing, MD Location: [facility] Note by: Kathlen Brunswick. Dossie Arbour, M.D, DABA, DABAPM, DABPM, DABIPP, FIPP Date: 06/20/2016; Time: 8:55 AM  Pain Score Disclaimer: We use the NRS-11 scale. This is a self-reported, subjective measurement of pain severity with only modest accuracy. It is  used primarily to identify changes within a particular patient. It must be understood that outpatient pain scales are significantly less accurate that those used for research, where they can be applied under ideal controlled circumstances with minimal exposure to variables. In reality, the score is likely to be a combination of pain intensity and pain affect, where pain affect describes the degree of emotional arousal or changes in action readiness caused by the sensory experience of pain. Factors such as social and work situation, setting, emotional state, anxiety levels, expectation, and prior pain experience may influence pain perception and show large inter-individual differences that may also be affected by time variables.  Patient instructions provided during this appointment: Patient Instructions   Steps to Quit Smoking Smoking tobacco can be harmful to your health and can affect almost every organ in your  body. Smoking puts you, and those around you, at risk for developing many serious chronic diseases. Quitting smoking is difficult, but it is one of the best things that you can do for your health. It is never too late to quit. What are the benefits of quitting smoking? When you quit smoking, you lower your risk of developing serious diseases and conditions, such as:  Lung cancer or lung disease, such as COPD.  Heart disease.  Stroke.  Heart attack.  Infertility.  Osteoporosis and bone fractures. Additionally, symptoms such as coughing, wheezing, and shortness of breath may get better when you quit. You may also find that you get sick less often because your body is stronger at fighting off colds and infections. If you are pregnant, quitting smoking can help to reduce your chances of having a baby of low birth weight. How do I get ready to quit? When you decide to quit smoking, create a plan to make sure that you are successful. Before you quit:  Pick a date to quit. Set a date within the next two weeks to give you time to prepare.  Write down the reasons why you are quitting. Keep this list in places where you will see it often, such as on your bathroom mirror or in your car or wallet.  Identify the people, places, things, and activities that make you want to smoke (triggers) and avoid them. Make sure to take these actions:  Throw away all cigarettes at home, at work, and in your car.  Throw away smoking accessories, such as Scientist, research (medical).  Clean your car and make sure to empty the ashtray.  Clean your home, including curtains and carpets.  Tell your family, friends, and coworkers that you are quitting. Support from your loved ones can make quitting easier.  Talk with your health care provider about your options for quitting smoking.  Find out what treatment options are covered by your health insurance. What strategies can I use to quit smoking? Talk with your  healthcare provider about different strategies to quit smoking. Some strategies include:  Quitting smoking altogether instead of gradually lessening how much you smoke over a period of time. Research shows that quitting "cold [place]" is more successful than gradually quitting.  Attending in-person counseling to help you build problem-solving skills. You are more likely to have success in quitting if you attend several counseling sessions. Even short sessions of 10 minutes can be effective.  Finding resources and support systems that can help you to quit smoking and remain smoke-free after you quit. These resources are most helpful when you use them often. They can include:  Online chats with  a Social worker.  Telephone quitlines.  Printed Furniture conservator/restorer.  Support groups or group counseling.  Text messaging programs.  Mobile phone applications.  Taking medicines to help you quit smoking. (If you are pregnant or breastfeeding, talk with your health care provider first.) Some medicines contain nicotine and some do not. Both types of medicines help with cravings, but the medicines that include nicotine help to relieve withdrawal symptoms. Your health care provider may recommend:  Nicotine patches, gum, or lozenges.  Nicotine inhalers or sprays.  Non-nicotine medicine that is taken by mouth. Talk with your health care provider about combining strategies, such as taking medicines while you are also receiving in-person counseling. Using these two strategies together makes you more likely to succeed in quitting than if you used either strategy on its own. If you are pregnant or breastfeeding, talk with your health care provider about finding counseling or other support strategies to quit smoking. Do not take medicine to help you quit smoking unless told to do so by your health care provider. What things can I do to make it easier to quit? Quitting smoking might feel overwhelming at first, but  there is a lot that you can do to make it easier. Take these important actions:  Reach out to your family and friends and ask that they support and encourage you during this time. Call telephone quitlines, reach out to support groups, or work with a counselor for support.  Ask people who smoke to avoid smoking around you.  Avoid places that trigger you to smoke, such as bars, parties, or smoke-break areas at work.  Spend time around people who do not smoke.  Lessen stress in your life, because stress can be a smoking trigger for some people. To lessen stress, try:  Exercising regularly.  Deep-breathing exercises.  Yoga.  Meditating.  Performing a body scan. This involves closing your eyes, scanning your body from head to toe, and noticing which parts of your body are particularly tense. Purposefully relax the muscles in those areas.  Download or purchase mobile phone or tablet apps (applications) that can help you stick to your quit plan by providing reminders, tips, and encouragement. There are many free apps, such as QuitGuide from the [employer] manager for Disease Control and Prevention). You can find other support for quitting smoking (smoking cessation) through smokefree.gov and other websites. How will I feel when I quit smoking? Within the first 24 hours of quitting smoking, you may start to feel some withdrawal symptoms. These symptoms are usually most noticeable 2-3 days after quitting, but they usually do not last beyond 2-3 weeks. Changes or symptoms that you might experience include:  Mood swings.  Restlessness, anxiety, or irritation.  Difficulty concentrating.  Dizziness.  Strong cravings for sugary foods in addition to nicotine.  Mild weight gain.  Constipation.  Nausea.  Coughing or a sore throat.  Changes in how your medicines work in your body.  A depressed mood.  Difficulty sleeping (insomnia). After the first 2-3 weeks of quitting, you may start to  notice more positive results, such as:  Improved sense of smell and taste.  Decreased coughing and sore throat.  Slower heart rate.  Lower blood pressure.  Clearer skin.  The ability to breathe more easily.  Fewer sick days. Quitting smoking is very challenging for most people. Do not get discouraged if you are not successful the first time. Some people need to make many attempts to quit before they achieve long-term success. Do your  best to stick to your quit plan, and talk with your health care provider if you have any questions or concerns. This information is not intended to replace advice given to you by your health care provider. Make sure you discuss any questions you have with your health care provider. Document Released: 02/19/2001 Document Revised: 10/24/2015 Document Reviewed: 07/12/2014 Elsevier Interactive Patient Education  2017 [employer]  Pain Score  Introduction: The pain score used by this practice is the Verbal Numerical Rating Scale (VNRS-11). This is an 11-point scale. It is for adults and children 10 years or older. There are significant differences in how the pain score is reported, used, and applied. Forget everything you learned in the past and learn this scoring system.  General Information: The scale should reflect your current level of pain. Unless you are specifically asked for the level of your worst pain, or your average pain. If you are asked for one of these two, then it should be understood that it is over the past 24 hours.  Basic Activities of Daily Living (ADL): Personal hygiene, dressing, eating, transferring, and using restroom.  Instructions: Most patients tend to report their level of pain as a combination of two factors, their physical pain and their psychosocial pain. This last one is also known as "suffering" and it is reflection of how physical pain affects you socially and psychologically. From now on, report them separately. From this  point on, when asked to report your pain level, report only your physical pain. Use the following table for reference.  Pain Clinic Pain Levels (0-5/10)  Pain Level Score Description  No Pain 0   Mild pain 1 Nagging, annoying, but does not interfere with basic activities of daily living (ADL). Patients are able to eat, bathe, get dressed, toileting (being able to get on and off the toilet and perform personal hygiene functions), transfer (move in and out of bed or a chair without assistance), and maintain continence (able to control bladder and bowel functions). Blood pressure and heart rate are unaffected. A normal heart rate for a healthy adult ranges from 60 to 100 bpm (beats per minute).   Mild to moderate pain 2 Noticeable and distracting. Impossible to hide from other people. More frequent flare-ups. Still possible to adapt and function close to normal. It can be very annoying and may have occasional stronger flare-ups. With discipline, patients may get used to it and adapt.   Moderate pain 3 Interferes significantly with activities of daily living (ADL). It becomes difficult to feed, bathe, get dressed, get on and off the toilet or to perform personal hygiene functions. Difficult to get in and out of bed or a chair without assistance. Very distracting. With effort, it can be ignored when deeply involved in activities.   Moderately severe pain 4 Impossible to ignore for more than a few minutes. With effort, patients may still be able to manage work or participate in some social activities. Very difficult to concentrate. Signs of autonomic nervous system discharge are evident: dilated pupils (mydriasis); mild sweating (diaphoresis); sleep interference. Heart rate becomes elevated (>115 bpm). Diastolic blood pressure (lower number) rises above 100 mmHg. Patients find relief in laying down and not moving.   Severe pain 5 Intense and extremely unpleasant. Associated with frowning face and frequent  crying. Pain overwhelms the senses.  Ability to do any activity or maintain social relationships becomes significantly limited. Conversation becomes difficult. Pacing back and forth is common, as getting into a comfortable position  is nearly impossible. Pain wakes you up from deep sleep. Physical signs will be obvious: pupillary dilation; increased sweating; goosebumps; brisk reflexes; cold, clammy hands and feet; nausea, vomiting or dry heaves; loss of appetite; significant sleep disturbance with inability to fall asleep or to remain asleep. When persistent, significant weight loss is observed due to the complete loss of appetite and sleep deprivation.  Blood pressure and heart rate becomes significantly elevated. Caution: If elevated blood pressure triggers a pounding headache associated with blurred vision, then the patient should immediately seek attention at an urgent or emergency care unit, as these may be signs of an impending stroke.    Emergency Department Pain Levels (6-10/10)  Emergency Room Pain 6 Severely limiting. Requires emergency care and should not be seen or managed at an outpatient pain management facility. Communication becomes difficult and requires great effort. Assistance to reach the emergency department may be required. Facial flushing and profuse sweating along with potentially dangerous increases in heart rate and blood pressure will be evident.   Distressing pain 7 Self-care is very difficult. Assistance is required to transport, or use restroom. Assistance to reach the emergency department will be required. Tasks requiring coordination, such as bathing and getting dressed become very difficult.   Disabling pain 8 Self-care is no longer possible. At this level, pain is disabling. The individual is unable to do even the most "basic" activities such as walking, eating, bathing, dressing, transferring to a bed, or toileting. Fine motor skills are lost. It is difficult to think  clearly.   Incapacitating pain 9 Pain becomes incapacitating. Thought processing is no longer possible. Difficult to remember your own name. Control of movement and coordination are lost.   The worst pain imaginable 10 At this level, most patients pass out from pain. When this level is reached, collapse of the autonomic nervous system occurs, leading to a sudden drop in blood pressure and heart rate. This in turn results in a temporary and dramatic drop in blood flow to the brain, leading to a loss of consciousness. Fainting is one of the body's self defense mechanisms. Passing out puts the brain in a calmed state and causes it to shut down for a while, in order to begin the healing process.    Summary: 1. Refer to this scale when providing [place] with your pain level. 2. Be accurate and careful when reporting your pain level. This will help with your care. 3. Over-reporting your pain level will lead to loss of credibility. 4. Even a level of 1/10 means that there is pain and will be treated at our facility. 5. High, inaccurate reporting will be documented as "Symptom Exaggeration", leading to loss of credibility and suspicions of possible secondary gains such as obtaining more narcotics, or wanting to appear disabled, for fraudulent reasons. 6. Only pain levels of 5 or below will be seen at our facility. 7. Pain levels of 6 and above will be sent to the Emergency Department and the appointment cancelled. _____________________________________________________________________________________________  Preparing for Procedure with Sedation Instructions: . Oral Intake: Do not eat or drink anything for at least 8 hours prior to your procedure. . Transportation: Public transportation is not allowed. Bring an adult driver. The driver must be physically present in our waiting room before any procedure can be started. Marland Kitchen Physical Assistance: Bring an adult physically capable of assisting you, in the event you  need help. This adult should keep you company at home for at least 6 hours after the procedure. Marland Kitchen  Blood Pressure Medicine: Take your blood pressure medicine with a sip of water the morning of the procedure. . Blood thinners:  . Diabetics on insulin: Notify the staff so that you can be scheduled 1st case in the morning. If your diabetes requires high dose insulin, take only  of your normal insulin dose the morning of the procedure and notify the staff that you have done so. . Preventing infections: Shower with an antibacterial soap the morning of your procedure. . Build-up your immune system: Take 1000 mg of Vitamin C with every meal (3 times a day) the day prior to your procedure. Marland Kitchen Antibiotics: Inform the staff if you have a condition or reason that requires you to take antibiotics before dental procedures. . Pregnancy: If you are pregnant, call and cancel the procedure. . Sickness: If you have a cold, fever, or any active infections, call and cancel the procedure. . Arrival: You must be in the facility at least 30 minutes prior to your scheduled procedure. . Children: Do not bring children with you. . Dress appropriately: Bring dark clothing that you would not mind if they get stained. . Valuables: Do not bring any jewelry or valuables. Procedure appointments are reserved for interventional treatments only. Marland Kitchen No Prescription Refills. . No medication changes will be discussed during procedure appointments. . No disability issues will be discussed.  ____________________________________________________________________________________________  GENERAL RISKS AND COMPLICATIONS  What are the risk, side effects and possible complications? Generally speaking, most procedures are safe.  However, with any procedure there are risks, side effects, and the possibility of complications.  The risks and complications are dependent upon the sites that are lesioned, or the type of nerve block to be performed.   The closer the procedure is to the spine, the more serious the risks are.  Great care is taken when placing the radio frequency needles, block needles or lesioning probes, but sometimes complications can occur. 1. Infection: Any time there is an injection through the skin, there is a risk of infection.  This is why sterile conditions are used for these blocks.  There are four possible types of infection. 1. Localized skin infection. 2. Central Nervous System Infection-This can be in the form of Meningitis, which can be deadly. 3. Epidural Infections-This can be in the form of an epidural abscess, which can cause pressure inside of the spine, causing compression of the spinal cord with subsequent paralysis. This would require an emergency surgery to decompress, and there are no guarantees that the patient would recover from the paralysis. 4. Discitis-This is an infection of the intervertebral discs.  It occurs in about 1% of discography procedures.  It is difficult to treat and it may lead to surgery.        2. Pain: the needles have to go through skin and soft tissues, will cause soreness.       3. Damage to internal structures:  The nerves to be lesioned may be near blood vessels or    other nerves which can be potentially damaged.       4. Bleeding: Bleeding is more common if the patient is taking blood thinners such as  aspirin, Coumadin, Ticiid, Plavix, etc., or if he/she have some genetic predisposition  such as hemophilia. Bleeding into the spinal canal can cause compression of the spinal  cord with subsequent paralysis.  This would require an emergency surgery to  decompress and there are no guarantees that the patient would recover from the  paralysis.  5. Pneumothorax:  Puncturing of a lung is a possibility, every time a needle is introduced in  the area of the chest or upper back.  Pneumothorax refers to free air around the  collapsed lung(s), inside of the thoracic cavity (chest cavity).   Another two possible  complications related to a similar event would include: Hemothorax and Chylothorax.   These are variations of the Pneumothorax, where instead of air around the collapsed  lung(s), you may have blood or chyle, respectively.       6. Spinal headaches: They may occur with any procedures in the area of the spine.       7. Persistent CSF (Cerebro-Spinal Fluid) leakage: This is a rare problem, but may occur  with prolonged intrathecal or epidural catheters either due to the formation of a fistulous  track or a dural tear.       8. Nerve damage: By working so close to the spinal cord, there is always a possibility of  nerve damage, which could be as serious as a permanent spinal cord injury with  paralysis.       9. Death:  Although rare, severe deadly allergic reactions known as "Anaphylactic  reaction" can occur to any of the medications used.      10. Worsening of the symptoms:  We can always make thing worse.  What are the chances of something like this happening? Chances of any of this occuring are extremely low.  By statistics, you have more of a chance of getting killed in a motor vehicle accident: while driving to the hospital than any of the above occurring .  Nevertheless, you should be aware that they are possibilities.  In general, it is similar to taking a shower.  Everybody knows that you can slip, hit your head and get killed.  Does that mean that you should not shower again?  Nevertheless always keep in mind that statistics do not mean anything if you happen to be on the wrong side of them.  Even if a procedure has a 1 (one) in a 1,000,000 (million) chance of going wrong, it you happen to be that one..Also, keep in mind that by statistics, you have more of a chance of having something go wrong when taking medications.  Who should not have this procedure? If you are on a blood thinning medication (e.g. Coumadin, Plavix, see list of "Blood Thinners"), or if you have an active  infection going on, you should not have the procedure.  If you are taking any blood thinners, please inform your physician.  How should I prepare for this procedure?  Do not eat or drink anything at least six hours prior to the procedure.  Bring a driver with you .  It cannot be a taxi.  Come accompanied by an adult that can drive you back, and that is strong enough to help you if your legs get weak or numb from the local anesthetic.  Take all of your medicines the morning of the procedure with just enough water to swallow them.  If you have diabetes, make sure that you are scheduled to have your procedure done first thing in the morning, whenever possible.  If you have diabetes, take only half of your insulin dose and notify our nurse that you have done so as soon as you arrive at the clinic.  If you are diabetic, but only take blood sugar pills (oral hypoglycemic), then do not take them on the morning of your  procedure.  You may take them after you have had the procedure.  Do not take aspirin or any aspirin-containing medications, at least eleven (11) days prior to the procedure.  They may prolong bleeding.  Wear loose fitting clothing that may be easy to take off and that you would not mind if it got stained with Betadine or blood.  Do not wear any jewelry or perfume  Remove any nail coloring.  It will interfere with some of our monitoring equipment.  NOTE: Remember that this is not meant to be interpreted as a complete list of all possible complications.  Unforeseen problems may occur.  BLOOD THINNERS The following drugs contain aspirin or other products, which can cause increased bleeding during surgery and should not be taken for 2 weeks prior to and 1 week after surgery.  If you should need take something for relief of minor pain, you may take acetaminophen which is found in Tylenol,m Datril, Anacin-3 and Panadol. It is not blood thinner. The products listed below are.  Do not  take any of the products listed below in addition to any listed on your instruction sheet.  A.P.C or A.P.C with Codeine Codeine Phosphate Capsules #3 Ibuprofen Ridaura  ABC compound Congesprin Imuran rimadil  Advil Cope Indocin Robaxisal  Alka-Seltzer Effervescent Pain Reliever and Antacid Coricidin or Coricidin-D  Indomethacin Rufen  Alka-Seltzer plus Cold Medicine Cosprin Ketoprofen S-A-C Tablets  Anacin Analgesic Tablets or Capsules Coumadin Korlgesic Salflex  Anacin Extra Strength Analgesic tablets or capsules CP-2 Tablets Lanoril Salicylate  Anaprox Cuprimine Capsules Levenox Salocol  Anexsia-D Dalteparin Magan Salsalate  Anodynos Darvon compound Magnesium Salicylate Sine-off  Ansaid Dasin Capsules Magsal Sodium Salicylate  Anturane Depen Capsules Marnal Soma  APF Arthritis pain formula Dewitt's Pills Measurin Stanback  Argesic Dia-Gesic Meclofenamic Sulfinpyrazone  Arthritis Bayer Timed Release Aspirin Diclofenac Meclomen Sulindac  Arthritis pain formula Anacin Dicumarol Medipren Supac  Analgesic (Safety coated) Arthralgen Diffunasal Mefanamic Suprofen  Arthritis Strength Bufferin Dihydrocodeine Mepro Compound Suprol  Arthropan liquid Dopirydamole Methcarbomol with Aspirin Synalgos  ASA tablets/Enseals Disalcid Micrainin Tagament  Ascriptin Doan's Midol Talwin  Ascriptin A/D Dolene Mobidin Tanderil  Ascriptin Extra Strength Dolobid Moblgesic Ticlid  Ascriptin with Codeine Doloprin or Doloprin with Codeine Momentum Tolectin  Asperbuf Duoprin Mono-gesic Trendar  Aspergum Duradyne Motrin or Motrin IB Triminicin  Aspirin plain, buffered or enteric coated Durasal Myochrisine Trigesic  Aspirin Suppositories Easprin Nalfon Trillsate  Aspirin with Codeine Ecotrin Regular or Extra Strength Naprosyn Uracel  Atromid-S Efficin Naproxen Ursinus  Auranofin Capsules Elmiron Neocylate Vanquish  Axotal Emagrin Norgesic Verin  Azathioprine Empirin or Empirin with Codeine Normiflo Vitamin E    Azolid Emprazil Nuprin Voltaren  Bayer Aspirin plain, buffered or children's or timed BC Tablets or powders Encaprin Orgaran Warfarin Sodium  Buff-a-Comp Enoxaparin Orudis Zorpin  Buff-a-Comp with Codeine Equegesic Os-Cal-Gesic   Buffaprin Excedrin plain, buffered or Extra Strength Oxalid   Bufferin Arthritis Strength Feldene Oxphenbutazone   Bufferin plain or Extra Strength Feldene Capsules Oxycodone with Aspirin   Bufferin with Codeine Fenoprofen Fenoprofen Pabalate or Pabalate-SF   Buffets II Flogesic Panagesic   Buffinol plain or Extra Strength Florinal or Florinal with Codeine Panwarfarin   Buf-Tabs Flurbiprofen Penicillamine   Butalbital Compound Four-way cold tablets Penicillin   Butazolidin Fragmin Pepto-Bismol   Carbenicillin Geminisyn Percodan   Carna Arthritis Reliever Geopen Persantine   Carprofen Gold's salt Persistin   Chloramphenicol Goody's Phenylbutazone   Chloromycetin Haltrain Piroxlcam   Clmetidine heparin Plaquenil   Cllnoril Hyco-pap Ponstel  Clofibrate Hydroxy chloroquine Propoxyphen         Before stopping any of these medications, be sure to consult the physician who ordered them.  Some, such as Coumadin (Warfarin) are ordered to prevent or treat serious conditions such as "deep thrombosis", "pumonary embolisms", and other heart problems.  The amount of time that you may need off of the medication may also vary with the medication and the reason for which you were taking it.  If you are taking any of these medications, please make sure you notify your pain physician before you undergo any procedures.

## 2016-06-20 ENCOUNTER — Encounter: Payer: Self-pay | Admitting: Pain Medicine

## 2016-06-20 ENCOUNTER — Ambulatory Visit (INDEPENDENT_AMBULATORY_CARE_PROVIDER_SITE_OTHER): Payer: Self-pay | Admitting: Clinical

## 2016-06-20 ENCOUNTER — Encounter (HOSPITAL_COMMUNITY): Payer: Self-pay | Admitting: Clinical

## 2016-06-20 ENCOUNTER — Ambulatory Visit: Payer: Self-pay | Attending: Pain Medicine | Admitting: Pain Medicine

## 2016-06-20 VITALS — BP 136/87 | HR 78 | Temp 98.1°F | Resp 18 | Ht 76.0 in | Wt 215.0 lb

## 2016-06-20 DIAGNOSIS — M791 Myalgia: Secondary | ICD-10-CM | POA: Insufficient documentation

## 2016-06-20 DIAGNOSIS — G8929 Other chronic pain: Secondary | ICD-10-CM

## 2016-06-20 DIAGNOSIS — F319 Bipolar disorder, unspecified: Secondary | ICD-10-CM

## 2016-06-20 DIAGNOSIS — F1021 Alcohol dependence, in remission: Secondary | ICD-10-CM

## 2016-06-20 DIAGNOSIS — M25561 Pain in right knee: Secondary | ICD-10-CM | POA: Insufficient documentation

## 2016-06-20 DIAGNOSIS — M79672 Pain in left foot: Secondary | ICD-10-CM | POA: Insufficient documentation

## 2016-06-20 DIAGNOSIS — M25562 Pain in left knee: Secondary | ICD-10-CM | POA: Insufficient documentation

## 2016-06-20 DIAGNOSIS — G894 Chronic pain syndrome: Secondary | ICD-10-CM | POA: Insufficient documentation

## 2016-06-20 DIAGNOSIS — Z79899 Other long term (current) drug therapy: Secondary | ICD-10-CM | POA: Insufficient documentation

## 2016-06-20 DIAGNOSIS — M5416 Radiculopathy, lumbar region: Secondary | ICD-10-CM | POA: Insufficient documentation

## 2016-06-20 DIAGNOSIS — M7918 Myalgia, other site: Secondary | ICD-10-CM

## 2016-06-20 DIAGNOSIS — G629 Polyneuropathy, unspecified: Secondary | ICD-10-CM | POA: Insufficient documentation

## 2016-06-20 DIAGNOSIS — M79671 Pain in right foot: Secondary | ICD-10-CM | POA: Insufficient documentation

## 2016-06-20 DIAGNOSIS — F1190 Opioid use, unspecified, uncomplicated: Secondary | ICD-10-CM

## 2016-06-20 DIAGNOSIS — Z79891 Long term (current) use of opiate analgesic: Secondary | ICD-10-CM | POA: Insufficient documentation

## 2016-06-20 DIAGNOSIS — M5441 Lumbago with sciatica, right side: Secondary | ICD-10-CM

## 2016-06-20 DIAGNOSIS — M5442 Lumbago with sciatica, left side: Secondary | ICD-10-CM

## 2016-06-20 MED ORDER — TRAMADOL HCL 50 MG PO TABS: 100.0000 mg | ORAL_TABLET | Freq: Four times a day (QID) | ORAL | 2 refills | Status: DC

## 2016-06-20 MED ORDER — GABAPENTIN 800 MG PO TABS: 800.0000 mg | ORAL_TABLET | Freq: Four times a day (QID) | ORAL | 2 refills | Status: DC

## 2016-06-20 MED ORDER — CYCLOBENZAPRINE HCL 10 MG PO TABS: 10.0000 mg | ORAL_TABLET | Freq: Every day | ORAL | 2 refills | Status: DC

## 2016-06-20 MED ORDER — BACLOFEN 10 MG PO TABS: 10.0000 mg | ORAL_TABLET | Freq: Three times a day (TID) | ORAL | 2 refills | Status: DC

## 2016-06-20 MED FILL — BACLOFEN 10 MG TABLET: 10 | 30 days supply | Qty: 90 | Fill #0

## 2016-06-20 MED FILL — ?CYCLOBENZAPRINE 10 MG TABL: 10 | 30 days supply | Qty: 30 | Fill #0

## 2016-06-20 MED FILL — traMADol HCL 50 MG TABS: 50 | 30 days supply | Qty: 240 | Fill #0

## 2016-06-20 NOTE — Patient Instructions (Addendum)
Steps to Quit Smoking Smoking tobacco can be harmful to your health and can affect almost every organ in your body. Smoking puts you, and those around you, at risk for developing many serious chronic diseases. Quitting smoking is difficult, but it is one of the best things that you can do for your health. It is never too late to quit. What are the benefits of quitting smoking? When you quit smoking, you lower your risk of developing serious diseases and conditions, such as:  Lung cancer or lung disease, such as COPD.  Heart disease.  Stroke.  Heart attack.  Infertility.  Osteoporosis and bone fractures. Additionally, symptoms such as coughing, wheezing, and shortness of breath may get better when you quit. You may also find that you get sick less often because your body is stronger at fighting off colds and infections. If you are pregnant, quitting smoking can help to reduce your chances of having a baby of low birth weight. How do I get ready to quit? When you decide to quit smoking, create a plan to make sure that you are successful. Before you quit:  Pick a date to quit. Set a date within the next two weeks to give you time to prepare.  Write down the reasons why you are quitting. Keep this list in places where you will see it often, such as on your bathroom mirror or in your car or wallet.  Identify the people, places, things, and activities that make you want to smoke (triggers) and avoid them. Make sure to take these actions:  Throw away all cigarettes at home, at work, and in your car.  Throw away smoking accessories, such as Scientist, research (medical).  Clean your car and make sure to empty the ashtray.  Clean your home, including curtains and carpets.  Tell your family, friends, and coworkers that you are quitting. Support from your loved ones can make quitting easier.  Talk with your health care provider about your options for quitting smoking.  Find out what treatment  options are covered by your health insurance. What strategies can I use to quit smoking? Talk with your healthcare provider about different strategies to quit smoking. Some strategies include:  Quitting smoking altogether instead of gradually lessening how much you smoke over a period of time. Research shows that quitting "cold [place]" is more successful than gradually quitting.  Attending in-person counseling to help you build problem-solving skills. You are more likely to have success in quitting if you attend several counseling sessions. Even short sessions of 10 minutes can be effective.  Finding resources and support systems that can help you to quit smoking and remain smoke-free after you quit. These resources are most helpful when you use them often. They can include:  Online chats with a Social worker.  Telephone quitlines.  Printed Furniture conservator/restorer.  Support groups or group counseling.  Text messaging programs.  Mobile phone applications.  Taking medicines to help you quit smoking. (If you are pregnant or breastfeeding, talk with your health care provider first.) Some medicines contain nicotine and some do not. Both types of medicines help with cravings, but the medicines that include nicotine help to relieve withdrawal symptoms. Your health care provider may recommend:  Nicotine patches, gum, or lozenges.  Nicotine inhalers or sprays.  Non-nicotine medicine that is taken by mouth. Talk with your health care provider about combining strategies, such as taking medicines while you are also receiving in-person counseling. Using these two strategies together makes you more  likely to succeed in quitting than if you used either strategy on its own. If you are pregnant or breastfeeding, talk with your health care provider about finding counseling or other support strategies to quit smoking. Do not take medicine to help you quit smoking unless told to do so by your health care  provider. What things can I do to make it easier to quit? Quitting smoking might feel overwhelming at first, but there is a lot that you can do to make it easier. Take these important actions:  Reach out to your family and friends and ask that they support and encourage you during this time. Call telephone quitlines, reach out to support groups, or work with a counselor for support.  Ask people who smoke to avoid smoking around you.  Avoid places that trigger you to smoke, such as bars, parties, or smoke-break areas at work.  Spend time around people who do not smoke.  Lessen stress in your life, because stress can be a smoking trigger for some people. To lessen stress, try:  Exercising regularly.  Deep-breathing exercises.  Yoga.  Meditating.  Performing a body scan. This involves closing your eyes, scanning your body from head to toe, and noticing which parts of your body are particularly tense. Purposefully relax the muscles in those areas.  Download or purchase mobile phone or tablet apps (applications) that can help you stick to your quit plan by providing reminders, tips, and encouragement. There are many free apps, such as QuitGuide from the [employer] Office manager for Disease Control and Prevention). You can find other support for quitting smoking (smoking cessation) through smokefree.gov and other websites. How will I feel when I quit smoking? Within the first 24 hours of quitting smoking, you may start to feel some withdrawal symptoms. These symptoms are usually most noticeable 2-3 days after quitting, but they usually do not last beyond 2-3 weeks. Changes or symptoms that you might experience include:  Mood swings.  Restlessness, anxiety, or irritation.  Difficulty concentrating.  Dizziness.  Strong cravings for sugary foods in addition to nicotine.  Mild weight gain.  Constipation.  Nausea.  Coughing or a sore throat.  Changes in how your medicines work in your  body.  A depressed mood.  Difficulty sleeping (insomnia). After the first 2-3 weeks of quitting, you may start to notice more positive results, such as:  Improved sense of smell and taste.  Decreased coughing and sore throat.  Slower heart rate.  Lower blood pressure.  Clearer skin.  The ability to breathe more easily.  Fewer sick days. Quitting smoking is very challenging for most people. Do not get discouraged if you are not successful the first time. Some people need to make many attempts to quit before they achieve long-term success. Do your best to stick to your quit plan, and talk with your health care provider if you have any questions or concerns. This information is not intended to replace advice given to you by your health care provider. Make sure you discuss any questions you have with your health care provider. Document Released: 02/19/2001 Document Revised: 10/24/2015 Document Reviewed: 07/12/2014 Elsevier Interactive Patient Education  2017 [employer]  Pain Score  Introduction: The pain score used by this practice is the Verbal Numerical Rating Scale (VNRS-11). This is an 11-point scale. It is for adults and children 10 years or older. There are significant differences in how the pain score is reported, used, and applied. Forget everything you learned in the past and  learn this scoring system.  General Information: The scale should reflect your current level of pain. Unless you are specifically asked for the level of your worst pain, or your average pain. If you are asked for one of these two, then it should be understood that it is over the past 24 hours.  Basic Activities of Daily Living (ADL): Personal hygiene, dressing, eating, transferring, and using restroom.  Instructions: Most patients tend to report their level of pain as a combination of two factors, their physical pain and their psychosocial pain. This last one is also known as "suffering" and it is  reflection of how physical pain affects you socially and psychologically. From now on, report them separately. From this point on, when asked to report your pain level, report only your physical pain. Use the following table for reference.  Pain Clinic Pain Levels (0-5/10)  Pain Level Score Description  No Pain 0   Mild pain 1 Nagging, annoying, but does not interfere with basic activities of daily living (ADL). Patients are able to eat, bathe, get dressed, toileting (being able to get on and off the toilet and perform personal hygiene functions), transfer (move in and out of bed or a chair without assistance), and maintain continence (able to control bladder and bowel functions). Blood pressure and heart rate are unaffected. A normal heart rate for a healthy adult ranges from 60 to 100 bpm (beats per minute).   Mild to moderate pain 2 Noticeable and distracting. Impossible to hide from other people. More frequent flare-ups. Still possible to adapt and function close to normal. It can be very annoying and may have occasional stronger flare-ups. With discipline, patients may get used to it and adapt.   Moderate pain 3 Interferes significantly with activities of daily living (ADL). It becomes difficult to feed, bathe, get dressed, get on and off the toilet or to perform personal hygiene functions. Difficult to get in and out of bed or a chair without assistance. Very distracting. With effort, it can be ignored when deeply involved in activities.   Moderately severe pain 4 Impossible to ignore for more than a few minutes. With effort, patients may still be able to manage work or participate in some social activities. Very difficult to concentrate. Signs of autonomic nervous system discharge are evident: dilated pupils (mydriasis); mild sweating (diaphoresis); sleep interference. Heart rate becomes elevated (>115 bpm). Diastolic blood pressure (lower number) rises above 100 mmHg. Patients find relief in  laying down and not moving.   Severe pain 5 Intense and extremely unpleasant. Associated with frowning face and frequent crying. Pain overwhelms the senses.  Ability to do any activity or maintain social relationships becomes significantly limited. Conversation becomes difficult. Pacing back and forth is common, as getting into a comfortable position is nearly impossible. Pain wakes you up from deep sleep. Physical signs will be obvious: pupillary dilation; increased sweating; goosebumps; brisk reflexes; cold, clammy hands and feet; nausea, vomiting or dry heaves; loss of appetite; significant sleep disturbance with inability to fall asleep or to remain asleep. When persistent, significant weight loss is observed due to the complete loss of appetite and sleep deprivation.  Blood pressure and heart rate becomes significantly elevated. Caution: If elevated blood pressure triggers a pounding headache associated with blurred vision, then the patient should immediately seek attention at an urgent or emergency care unit, as these may be signs of an impending stroke.    Emergency Department Pain Levels (6-10/10)  Emergency Room Pain 6 Severely  limiting. Requires emergency care and should not be seen or managed at an outpatient pain management facility. Communication becomes difficult and requires great effort. Assistance to reach the emergency department may be required. Facial flushing and profuse sweating along with potentially dangerous increases in heart rate and blood pressure will be evident.   Distressing pain 7 Self-care is very difficult. Assistance is required to transport, or use restroom. Assistance to reach the emergency department will be required. Tasks requiring coordination, such as bathing and getting dressed become very difficult.   Disabling pain 8 Self-care is no longer possible. At this level, pain is disabling. The individual is unable to do even the most "basic" activities such as  walking, eating, bathing, dressing, transferring to a bed, or toileting. Fine motor skills are lost. It is difficult to think clearly.   Incapacitating pain 9 Pain becomes incapacitating. Thought processing is no longer possible. Difficult to remember your own name. Control of movement and coordination are lost.   The worst pain imaginable 10 At this level, most patients pass out from pain. When this level is reached, collapse of the autonomic nervous system occurs, leading to a sudden drop in blood pressure and heart rate. This in turn results in a temporary and dramatic drop in blood flow to the brain, leading to a loss of consciousness. Fainting is one of the body's self defense mechanisms. Passing out puts the brain in a calmed state and causes it to shut down for a while, in order to begin the healing process.    Summary: 1. Refer to this scale when providing [place] with your pain level. 2. Be accurate and careful when reporting your pain level. This will help with your care. 3. Over-reporting your pain level will lead to loss of credibility. 4. Even a level of 1/10 means that there is pain and will be treated at our facility. 5. High, inaccurate reporting will be documented as "Symptom Exaggeration", leading to loss of credibility and suspicions of possible secondary gains such as obtaining more narcotics, or wanting to appear disabled, for fraudulent reasons. 6. Only pain levels of 5 or below will be seen at our facility. 7. Pain levels of 6 and above will be sent to the Emergency Department and the appointment cancelled. _____________________________________________________________________________________________  Preparing for Procedure with Sedation Instructions: . Oral Intake: Do not eat or drink anything for at least 8 hours prior to your procedure. . Transportation: Public transportation is not allowed. Bring an adult driver. The driver must be physically present in our waiting room  before any procedure can be started. Marland Kitchen Physical Assistance: Bring an adult physically capable of assisting you, in the event you need help. This adult should keep you company at home for at least 6 hours after the procedure. . Blood Pressure Medicine: Take your blood pressure medicine with a sip of water the morning of the procedure. . Blood thinners:  . Diabetics on insulin: Notify the staff so that you can be scheduled 1st case in the morning. If your diabetes requires high dose insulin, take only  of your normal insulin dose the morning of the procedure and notify the staff that you have done so. . Preventing infections: Shower with an antibacterial soap the morning of your procedure. . Build-up your immune system: Take 1000 mg of Vitamin C with every meal (3 times a day) the day prior to your procedure. Marland Kitchen Antibiotics: Inform the staff if you have a condition or reason that requires you to  take antibiotics before dental procedures. . Pregnancy: If you are pregnant, call and cancel the procedure. . Sickness: If you have a cold, fever, or any active infections, call and cancel the procedure. . Arrival: You must be in the facility at least 30 minutes prior to your scheduled procedure. . Children: Do not bring children with you. . Dress appropriately: Bring dark clothing that you would not mind if they get stained. . Valuables: Do not bring any jewelry or valuables. Procedure appointments are reserved for interventional treatments only. Marland Kitchen No Prescription Refills. . No medication changes will be discussed during procedure appointments. . No disability issues will be discussed.  ____________________________________________________________________________________________  GENERAL RISKS AND COMPLICATIONS  What are the risk, side effects and possible complications? Generally speaking, most procedures are safe.  However, with any procedure there are risks, side effects, and the possibility of  complications.  The risks and complications are dependent upon the sites that are lesioned, or the type of nerve block to be performed.  The closer the procedure is to the spine, the more serious the risks are.  Great care is taken when placing the radio frequency needles, block needles or lesioning probes, but sometimes complications can occur. 1. Infection: Any time there is an injection through the skin, there is a risk of infection.  This is why sterile conditions are used for these blocks.  There are four possible types of infection. 1. Localized skin infection. 2. Central Nervous System Infection-This can be in the form of Meningitis, which can be deadly. 3. Epidural Infections-This can be in the form of an epidural abscess, which can cause pressure inside of the spine, causing compression of the spinal cord with subsequent paralysis. This would require an emergency surgery to decompress, and there are no guarantees that the patient would recover from the paralysis. 4. Discitis-This is an infection of the intervertebral discs.  It occurs in about 1% of discography procedures.  It is difficult to treat and it may lead to surgery.        2. Pain: the needles have to go through skin and soft tissues, will cause soreness.       3. Damage to internal structures:  The nerves to be lesioned may be near blood vessels or    other nerves which can be potentially damaged.       4. Bleeding: Bleeding is more common if the patient is taking blood thinners such as  aspirin, Coumadin, Ticiid, Plavix, etc., or if he/she have some genetic predisposition  such as hemophilia. Bleeding into the spinal canal can cause compression of the spinal  cord with subsequent paralysis.  This would require an emergency surgery to  decompress and there are no guarantees that the patient would recover from the  paralysis.       5. Pneumothorax:  Puncturing of a lung is a possibility, every time a needle is introduced in  the area of  the chest or upper back.  Pneumothorax refers to free air around the  collapsed lung(s), inside of the thoracic cavity (chest cavity).  Another two possible  complications related to a similar event would include: Hemothorax and Chylothorax.   These are variations of the Pneumothorax, where instead of air around the collapsed  lung(s), you may have blood or chyle, respectively.       6. Spinal headaches: They may occur with any procedures in the area of the spine.       7. Persistent CSF (Cerebro-Spinal Fluid)  leakage: This is a rare problem, but may occur  with prolonged intrathecal or epidural catheters either due to the formation of a fistulous  track or a dural tear.       8. Nerve damage: By working so close to the spinal cord, there is always a possibility of  nerve damage, which could be as serious as a permanent spinal cord injury with  paralysis.       9. Death:  Although rare, severe deadly allergic reactions known as "Anaphylactic  reaction" can occur to any of the medications used.      10. Worsening of the symptoms:  We can always make thing worse.  What are the chances of something like this happening? Chances of any of this occuring are extremely low.  By statistics, you have more of a chance of getting killed in a motor vehicle accident: while driving to the hospital than any of the above occurring .  Nevertheless, you should be aware that they are possibilities.  In general, it is similar to taking a shower.  Everybody knows that you can slip, hit your head and get killed.  Does that mean that you should not shower again?  Nevertheless always keep in mind that statistics do not mean anything if you happen to be on the wrong side of them.  Even if a procedure has a 1 (one) in a 1,000,000 (million) chance of going wrong, it you happen to be that one..Also, keep in mind that by statistics, you have more of a chance of having something go wrong when taking medications.  Who should not have  this procedure? If you are on a blood thinning medication (e.g. Coumadin, Plavix, see list of "Blood Thinners"), or if you have an active infection going on, you should not have the procedure.  If you are taking any blood thinners, please inform your physician.  How should I prepare for this procedure?  Do not eat or drink anything at least six hours prior to the procedure.  Bring a driver with you .  It cannot be a taxi.  Come accompanied by an adult that can drive you back, and that is strong enough to help you if your legs get weak or numb from the local anesthetic.  Take all of your medicines the morning of the procedure with just enough water to swallow them.  If you have diabetes, make sure that you are scheduled to have your procedure done first thing in the morning, whenever possible.  If you have diabetes, take only half of your insulin dose and notify our nurse that you have done so as soon as you arrive at the clinic.  If you are diabetic, but only take blood sugar pills (oral hypoglycemic), then do not take them on the morning of your procedure.  You may take them after you have had the procedure.  Do not take aspirin or any aspirin-containing medications, at least eleven (11) days prior to the procedure.  They may prolong bleeding.  Wear loose fitting clothing that may be easy to take off and that you would not mind if it got stained with Betadine or blood.  Do not wear any jewelry or perfume  Remove any nail coloring.  It will interfere with some of our monitoring equipment.  NOTE: Remember that this is not meant to be interpreted as a complete list of all possible complications.  Unforeseen problems may occur.  BLOOD THINNERS The following drugs contain aspirin or  other products, which can cause increased bleeding during surgery and should not be taken for 2 weeks prior to and 1 week after surgery.  If you should need take something for relief of minor pain, you may take  acetaminophen which is found in Tylenol,m Datril, Anacin-3 and Panadol. It is not blood thinner. The products listed below are.  Do not take any of the products listed below in addition to any listed on your instruction sheet.  A.P.C or A.P.C with Codeine Codeine Phosphate Capsules #3 Ibuprofen Ridaura  ABC compound Congesprin Imuran rimadil  Advil Cope Indocin Robaxisal  Alka-Seltzer Effervescent Pain Reliever and Antacid Coricidin or Coricidin-D  Indomethacin Rufen  Alka-Seltzer plus Cold Medicine Cosprin Ketoprofen S-A-C Tablets  Anacin Analgesic Tablets or Capsules Coumadin Korlgesic Salflex  Anacin Extra Strength Analgesic tablets or capsules CP-2 Tablets Lanoril Salicylate  Anaprox Cuprimine Capsules Levenox Salocol  Anexsia-D Dalteparin Magan Salsalate  Anodynos Darvon compound Magnesium Salicylate Sine-off  Ansaid Dasin Capsules Magsal Sodium Salicylate  Anturane Depen Capsules Marnal Soma  APF Arthritis pain formula Dewitt's Pills Measurin Stanback  Argesic Dia-Gesic Meclofenamic Sulfinpyrazone  Arthritis Bayer Timed Release Aspirin Diclofenac Meclomen Sulindac  Arthritis pain formula Anacin Dicumarol Medipren Supac  Analgesic (Safety coated) Arthralgen Diffunasal Mefanamic Suprofen  Arthritis Strength Bufferin Dihydrocodeine Mepro Compound Suprol  Arthropan liquid Dopirydamole Methcarbomol with Aspirin Synalgos  ASA tablets/Enseals Disalcid Micrainin Tagament  Ascriptin Doan's Midol Talwin  Ascriptin A/D Dolene Mobidin Tanderil  Ascriptin Extra Strength Dolobid Moblgesic Ticlid  Ascriptin with Codeine Doloprin or Doloprin with Codeine Momentum Tolectin  Asperbuf Duoprin Mono-gesic Trendar  Aspergum Duradyne Motrin or Motrin IB Triminicin  Aspirin plain, buffered or enteric coated Durasal Myochrisine Trigesic  Aspirin Suppositories Easprin Nalfon Trillsate  Aspirin with Codeine Ecotrin Regular or Extra Strength Naprosyn Uracel  Atromid-S Efficin Naproxen Ursinus  Auranofin  Capsules Elmiron Neocylate Vanquish  Axotal Emagrin Norgesic Verin  Azathioprine Empirin or Empirin with Codeine Normiflo Vitamin E  Azolid Emprazil Nuprin Voltaren  Bayer Aspirin plain, buffered or children's or timed BC Tablets or powders Encaprin Orgaran Warfarin Sodium  Buff-a-Comp Enoxaparin Orudis Zorpin  Buff-a-Comp with Codeine Equegesic Os-Cal-Gesic   Buffaprin Excedrin plain, buffered or Extra Strength Oxalid   Bufferin Arthritis Strength Feldene Oxphenbutazone   Bufferin plain or Extra Strength Feldene Capsules Oxycodone with Aspirin   Bufferin with Codeine Fenoprofen Fenoprofen Pabalate or Pabalate-SF   Buffets II Flogesic Panagesic   Buffinol plain or Extra Strength Florinal or Florinal with Codeine Panwarfarin   Buf-Tabs Flurbiprofen Penicillamine   Butalbital Compound Four-way cold tablets Penicillin   Butazolidin Fragmin Pepto-Bismol   Carbenicillin Geminisyn Percodan   Carna Arthritis Reliever Geopen Persantine   Carprofen Gold's salt Persistin   Chloramphenicol Goody's Phenylbutazone   Chloromycetin Haltrain Piroxlcam   Clmetidine heparin Plaquenil   Cllnoril Hyco-pap Ponstel   Clofibrate Hydroxy chloroquine Propoxyphen         Before stopping any of these medications, be sure to consult the physician who ordered them.  Some, such as Coumadin (Warfarin) are ordered to prevent or treat serious conditions such as "deep thrombosis", "pumonary embolisms", and other heart problems.  The amount of time that you may need off of the medication may also vary with the medication and the reason for which you were taking it.  If you are taking any of these medications, please make sure you notify your pain physician before you undergo any procedures.

## 2016-06-20 NOTE — Progress Notes (Signed)
THERAPIST PROGRESS NOTE  Session Time: 1:30 - 2:25  Participation Level: Active  Behavioral Response: CasualAlertDepressed  Type of Therapy: Individual therapy  Treatment Goals addressed: Improve psychiatric symptoms, elevate mood , Improve unhelpful thought patterns, , healthy coping skills  Interventions: motivational interviewing, cbt, grounding  techniques,   Summary: Mark [name] is a 51 y.o. 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 Mark [name] discussed his psychiatric symptoms, his current life events, and his homework. Mark [name]  Shared that he did not complete his homework yet. He shared that he continues to struggle with his depression. He shared that his physical pain  And continued problems with sleep continues to cause him difficulty. Clinician asked open ended questions and Mark [name] shared about when he is able to distract from the pain some and improve his mood. Mark [name] shared that he can't engage in his old hobbies. Clinician asked open ended questions and  Mark [name] shared about an interest he used to have that wouldn't hurt him physically. Client and clinician discussed importance of engaging in activities that improve his mood.  Client and clinician discussed his excitement about his grandchild who will be born soon. Mark [name] shared how he will help with the child. He recognizes that when he is engaging with others he is less focused on his pain. Client and clinician discussed additional ways he could engage with others. Clinician taught him a new grounding technique. Client and clinician practiced the technique together.   Plan: Return in 1-2 weeks.  Diagnosis: Axis I: Bipolar I disorder, Alcohol use disorder, severe in early remission    Pascale Maves A, LCSW 06/20/2016

## 2016-06-20 NOTE — Progress Notes (Signed)
Nursing Pain Medication Assessment:  Safety precautions to be maintained throughout the outpatient stay will include: orient to surroundings, keep bed in low position, maintain call bell within reach at all times, provide assistance with transfer out of bed and ambulation.  Medication Inspection Compliance: Pill count conducted under aseptic conditions, in front of the patient. Neither the pills nor the bottle was removed from the patient's sight at any time. Once count was completed pills were immediately returned to the patient in their original bottle.  Medication: Tramadol (Ultram) Pill/Patch Count: 0 of 240 pills remain Pill/Patch Appearance: Markings consistent with prescribed medication Bottle Appearance: Standard pharmacy container. Clearly labeled. Filled Date:3 / 13 / 2018 Last Medication intake:  Today

## 2016-06-26 ENCOUNTER — Ambulatory Visit (HOSPITAL_BASED_OUTPATIENT_CLINIC_OR_DEPARTMENT_OTHER): Payer: Self-pay | Admitting: Pain Medicine

## 2016-06-26 ENCOUNTER — Ambulatory Visit
Admission: RE | Admit: 2016-06-26 | Discharge: 2016-06-26 | Disposition: A | Discharge status: Home / Self Care | Payer: Self-pay | Source: Ambulatory Visit | Attending: Pain Medicine | Admitting: Pain Medicine

## 2016-06-26 ENCOUNTER — Encounter: Payer: Self-pay | Admitting: Pain Medicine

## 2016-06-26 VITALS — BP 128/80 | HR 70 | Temp 97.3°F | Resp 18 | Ht 76.0 in | Wt 215.0 lb

## 2016-06-26 DIAGNOSIS — M25562 Pain in left knee: Secondary | ICD-10-CM | POA: Insufficient documentation

## 2016-06-26 DIAGNOSIS — G629 Polyneuropathy, unspecified: Secondary | ICD-10-CM

## 2016-06-26 DIAGNOSIS — G8929 Other chronic pain: Secondary | ICD-10-CM

## 2016-06-26 DIAGNOSIS — M79604 Pain in right leg: Secondary | ICD-10-CM | POA: Insufficient documentation

## 2016-06-26 DIAGNOSIS — M25561 Pain in right knee: Secondary | ICD-10-CM | POA: Insufficient documentation

## 2016-06-26 DIAGNOSIS — M79605 Pain in left leg: Secondary | ICD-10-CM

## 2016-06-26 HISTORY — DX: Other chronic pain: G89.29

## 2016-06-26 HISTORY — DX: Pain in right leg: M79.604

## 2016-06-26 MED ORDER — DEXAMETHASONE SODIUM PHOSPHATE 4 MG/ML IJ SOLN
10.0000 mg | Freq: Once | INTRAMUSCULAR | Status: AC
  Administered 2016-06-26: 4 mg
  Filled 2016-06-26: qty 3

## 2016-06-26 MED ORDER — MIDAZOLAM HCL 5 MG/5ML IJ SOLN
1.0000 mg | INTRAMUSCULAR | Status: DC | PRN
  Administered 2016-06-26: 5 mg via INTRAVENOUS
  Filled 2016-06-26: qty 5

## 2016-06-26 MED ORDER — IOPAMIDOL (ISOVUE-M 200) INJECTION 41%
10.0000 mL | Freq: Once | INTRAMUSCULAR | Status: AC
  Administered 2016-06-26: 10 mL via EPIDURAL
  Filled 2016-06-26: qty 10

## 2016-06-26 MED ORDER — LIDOCAINE HCL (PF) 1 % IJ SOLN
10.0000 mL | Freq: Once | INTRAMUSCULAR | Status: AC
  Administered 2016-06-26: 5 mL
  Filled 2016-06-26: qty 10

## 2016-06-26 MED ORDER — FENTANYL CITRATE (PF) 100 MCG/2ML IJ SOLN
25.0000 ug | INTRAMUSCULAR | Status: DC | PRN
  Administered 2016-06-26: 50 ug via INTRAVENOUS
  Filled 2016-06-26: qty 2

## 2016-06-26 MED ORDER — BUPIVACAINE-EPINEPHRINE (PF) 0.25% -1:200000 IJ SOLN
INTRAMUSCULAR | Status: AC
  Filled 2016-06-26: qty 30

## 2016-06-26 MED ORDER — LACTATED RINGERS IV SOLN
1000.0000 mL | Freq: Once | INTRAVENOUS | Status: AC
  Administered 2016-06-26: 1000 mL via INTRAVENOUS

## 2016-06-26 MED ORDER — BUPIVACAINE-EPINEPHRINE 0.25% -1:200000 IJ SOLN
10.0000 mL | Freq: Once | INTRAMUSCULAR | Status: AC
  Administered 2016-06-26: 10 mL
  Filled 2016-06-26: qty 10

## 2016-06-26 NOTE — Patient Instructions (Addendum)
Post-Procedure instructions Instructions:  Apply ice: Fill a plastic sandwich bag with crushed ice. Cover it with a small towel and apply to injection site. Apply for 15 minutes then remove x 15 minutes. Repeat sequence on day of procedure, until you go to bed. The purpose is to minimize swelling and discomfort after procedure.  Apply heat: Apply heat to procedure site starting the day following the procedure. The purpose is to treat any soreness and discomfort from the procedure.  Food intake: Start with clear liquids (like water) and advance to regular food, as tolerated.   Physical activities: Keep activities to a minimum for the first 8 hours after the procedure.   Driving: If you have received any sedation, you are not allowed to drive for 24 hours after your procedure.  Blood thinner: Restart your blood thinner 6 hours after your procedure. (Only for those taking blood thinners)  Insulin: As soon as you can eat, you may resume your normal dosing schedule. (Only for those taking insulin)  Infection prevention: Keep procedure site clean and dry.  Post-procedure Pain Diary: Extremely important that this be done correctly and accurately. Recorded information will be used to determine the next step in treatment.  Pain evaluated is that of treated area only. Do not include pain from an untreated area.  Complete every hour, on the hour, for the initial 8 hours. Set an alarm to help you do this part accurately.  Do not go to sleep and have it completed later. It will not be accurate.  Follow-up appointment: Keep your follow-up appointment after the procedure. Usually 2 weeks for most procedures. (6 weeks in the case of radiofrequency.) Bring you pain diary.  Expect:  From numbing medicine (AKA: Local Anesthetics): Numbness or decrease in pain.  Onset: Full effect within 15 minutes of injected.  Duration: It will depend on the type of local anesthetic used. On the average, 1 to 8  hours.   From steroids: Decrease in swelling or inflammation. Once inflammation is improved, relief of the pain will follow.  Onset of benefits: Depends on the amount of swelling present. The more swelling, the longer it will take for the benefits to be seen.   Duration: Steroids will stay in the system x 2 weeks. Duration of benefits will depend on multiple posibilities including persistent irritating factors.  From procedure: Some discomfort is to be expected once the numbing medicine wears off. This should be minimal if ice and heat are applied as instructed. Call if:  You experience numbness and weakness that gets worse with time, as opposed to wearing off.  New onset bowel or bladder incontinence. (Spinal procedures only)  Emergency Numbers:  Durning business hours (Monday - Thursday, 8:00 AM - 4:00 PM) (Friday, 9:00 AM - 12:00 Noon): [phone]  After hours: [phone]   __________________________________________________________________________________________   Pain Management Discharge Instructions  General Discharge Instructions :  If you need to reach your doctor call: Monday-Friday 8:00 am - 4:00 pm at [phone] or toll free [phone].  After clinic hours [phone] to have operator reach doctor.  Bring all of your medication bottles to all your appointments in the pain clinic.  To cancel or reschedule your appointment with Pain Management please remember to call 24 hours in advance to avoid a fee.  Refer to the educational materials which you have been given on: General Risks, I had my Procedure. Discharge Instructions, Post Sedation.  Post Procedure Instructions:  The drugs you were given will stay in   your system until tomorrow, so for the next 24 hours you should not drive, make any legal decisions or drink any alcoholic beverages.  You may eat anything you prefer, but it is better to start with liquids then soups and crackers, and gradually  work up to solid foods.  Please notify your doctor immediately if you have any unusual bleeding, trouble breathing or pain that is not related to your normal pain.  Depending on the type of procedure that was done, some parts of your body may feel week and/or numb.  This usually clears up by tonight or the next day.  Walk with the use of an assistive device or accompanied by an adult for the 24 hours.  You may use ice on the affected area for the first 24 hours.  Put ice in a Ziploc bag and cover with a towel and place against area 15 minutes on 15 minutes off.  You may switch to heat after 24 hours. 

## 2016-06-26 NOTE — Progress Notes (Signed)
Safety precautions to be maintained throughout the outpatient stay will include: orient to surroundings, keep bed in low position, maintain call bell within reach at all times, provide assistance with transfer out of bed and ambulation.  

## 2016-06-26 NOTE — Progress Notes (Addendum)
Patient's Name: Mark [name]  MRN: [id number]  Referring Provider: Milinda Pointer, MD  DOB: 01-25-1966  PCP: Boykin Nearing, MD  DOS: 06/26/2016  Note by: Kathlen Brunswick. Dossie Arbour, MD  Service setting: Ambulatory outpatient  Location: [facility] (AMB) Pain Management Facility  Visit type: Procedure  Specialty: Interventional Pain Management  Patient type: Established   Primary Reason for Visit: Interventional Pain Management Treatment. CC: Back Pain (lower)  Procedure:  Anesthesia, Analgesia, Anxiolysis:  Type: Diagnostic Lumbar Sympathetic Block Region:Lumbosacral Level: L3, L4 Laterality: Right-Sided Paravertebral  Type: Local Anesthesia with Moderate (Conscious) Sedation Local Anesthetic: Lidocaine 1% Route: Intravenous (IV) IV Access: Secured Sedation: Meaningful verbal contact was maintained at all times during the procedure  Indication(s): Analgesia and Anxiety  Indications: 1. Neuropathy, Peripheral (Feet) ([name]) (Location of Primary Source of Pain) (Bilateral) (R>L)   2. Chronic pain of both knees(6)(B)(R>L)   3. Chronic pain of lower extremity (Bilateral) (R>L)    Pain Score: Pre-procedure: 4 /10 Post-procedure: 0-No pain/10  Pre-op Assessment:  Previous date of service: 06/20/16 Service provided: Evaluation Mark [name] is a 51 y.o. (year old), male patient, seen today for interventional treatment. He  has a past surgical history that includes Back surgery (1995, 1999); Neck surgery (2000); Nasal sinus surgery; left heart catheterization with coronary angiogram (N/A, 01/13/2014); Subacromial decompression (Right, 11/16/2014); Nissen fundoplication; 24 hour ph study (N/A, 01/16/2015); Upper gastrointestinal endoscopy; and Colonoscopy (1998). His primarily concern today is the Back Pain (lower)  Initial Vital Signs: Blood pressure 136/73, pulse (!) 55, temperature 97.7 F (36.5 C), temperature source Oral, resp. rate 16, height 6\' 4"  (1.93 m), weight 215 lb (97.5 kg), SpO2 100  %. BMI: 26.17 kg/m  Risk Assessment: Allergies: Reviewed. He is allergic to cozaar [losartan] and lisinopril.  Allergy Precautions: None required Coagulopathies: "Reviewed. None identified.  Blood-thinner therapy: None at this time Active Infection(s): Reviewed. None identified. Mark [name] is afebrile  Site Confirmation: Mark [name] was asked to confirm the procedure and laterality before marking the site Procedure checklist: Completed Consent: Before the procedure and under the influence of no sedative(s), amnesic(s), or anxiolytics, the patient was informed of the treatment options, risks and possible complications. To fulfill our ethical and legal obligations, as recommended by the American Medical Association's Code of Ethics, I have informed the patient of my clinical impression; the nature and purpose of the treatment or procedure; the risks, benefits, and possible complications of the intervention; the alternatives, including doing nothing; the risk(s) and benefit(s) of the alternative treatment(s) or procedure(s); and the risk(s) and benefit(s) of doing nothing. The patient was provided information about the general risks and possible complications associated with the procedure. These may include, but are not limited to: failure to achieve desired goals, infection, bleeding, organ or nerve damage, allergic reactions, paralysis, and death. In addition, the patient was informed of those risks and complications associated to Spine-related procedures, such as failure to decrease pain; infection (i.e.: Meningitis, epidural or intraspinal abscess); bleeding (i.e.: epidural hematoma, subarachnoid hemorrhage, or any other type of intraspinal or peri-dural bleeding); organ or nerve damage (i.e.: Any type of peripheral nerve, nerve root, or spinal cord injury) with subsequent damage to sensory, motor, and/or autonomic systems, resulting in permanent pain, numbness, and/or weakness of one or several  areas of the body; allergic reactions; (i.e.: anaphylactic reaction); and/or death. Furthermore, the patient was informed of those risks and complications associated with the medications. These include, but are not limited to: allergic reactions (i.e.: anaphylactic or anaphylactoid reaction(s)); adrenal axis  suppression; blood sugar elevation that in diabetics may result in ketoacidosis or comma; water retention that in patients with history of congestive heart failure may result in shortness of breath, pulmonary edema, and decompensation with resultant heart failure; weight gain; swelling or edema; medication-induced neural toxicity; particulate matter embolism and blood vessel occlusion with resultant organ, and/or nervous system infarction; and/or aseptic necrosis of one or more joints. Finally, the patient was informed that Medicine is not an exact science; therefore, there is also the possibility of unforeseen or unpredictable risks and/or possible complications that may result in a catastrophic outcome. The patient indicated having understood very clearly. We have given the patient no guarantees and we have made no promises. Enough time was given to the patient to ask questions, all of which were answered to the patient's satisfaction. Mr. Nurse has indicated that he wanted to continue with the procedure. Attestation: I, the ordering provider, attest that I have discussed with the patient the benefits, risks, side-effects, alternatives, likelihood of achieving goals, and potential problems during recovery for the procedure that I have provided informed consent. Date: 06/26/2016; Time: 8:55 AM  Pre-Procedure Preparation:  Monitoring: As per clinic protocol. Respiration, ETCO2, SpO2, BP, heart rate and rhythm monitor placed and checked for adequate function Safety Precautions: Patient was assessed for positional comfort and pressure points before starting the procedure. Time-out: I initiated and  conducted the "Time-out" before starting the procedure, as per protocol. The patient was asked to participate by confirming the accuracy of the "Time Out" information. Verification of the correct person, site, and procedure were performed and confirmed by me, the nursing staff, and the patient. "Time-out" conducted as per Joint Commission's Universal Protocol (UP.01.01.01). "Time-out" Date & Time: 06/26/2016; 0951 hrs.  Description of Procedure Process:  Position: Prone with head of the table was raised to facilitate breathing. Target Area: For Lumbar Sympathetic Block(s), the target is the anterolateral aspect of the L3 & L4 vertebral bodies, where the lumbar sympathetic chain resides. Approach: Paravertebral, ipsilateral approach. Area Prepped: Entire Posterior Thoracolumbar Region Prepping solution:  ChloraPrep (2% chlorhexidine gluconate and 70% isopropyl alcohol) Safety Precautions: Aspiration looking for blood return was conducted prior to all injections. At no point did we inject any substances, as a needle was being advanced. No attempts were made at seeking any paresthesias. Safe injection practices and needle disposal techniques used. Medications properly checked for expiration dates. SDV (single dose vial) medications used. Description of the Procedure: Protocol guidelines were followed. The patient was placed in position over the procedure table. The target area was identified and the area prepped in the usual manner. Skin & deeper tissues infiltrated with local anesthetic. Appropriate amount of time allowed to pass for local anesthetics to take effect. The procedure needles were then advanced to the target area, the superior anterolateral border of the L3 vertebral body, under pulsed fluoroscopic guidance. Care was taken not to advance the tip of the needle past the anterior border of the vertebral body, on the lateral fluoroscopic view. Proper needle placement secured. Negative aspiration  confirmed. Solution injected in intermittent fashion, asking for systemic symptoms every 0.5cc of injectate. The needles were then removed and the area cleansed, making sure to leave some of the prepping solution back to take advantage of its long term bactericidal properties. Vitals:   06/26/16 1015 06/26/16 1026 06/26/16 1036 06/26/16 1044  BP: (!) 83/40 107/79 115/78 128/80  Pulse:  67 68 70  Resp: 16 16 16 18   Temp:  97.3 F (  36.3 C)    TempSrc:  Tympanic    SpO2: 92% 93% 97% 98%  Weight:      Height:        Start Time: 0953 hrs. End Time: 1012 hrs. Materials:  Needle(s) Type: Regular needle Gauge: 22G Length: 3.5-in Medication(s): We administered lactated ringers, midazolam, fentaNYL, dexamethasone, iopamidol, and lidocaine (PF). Please see chart orders for dosing details.  Imaging Guidance (Spinal):  Type of Imaging Technique: Fluoroscopy Guidance (Spinal) Indication(s): Assistance in needle guidance and placement for procedures requiring needle placement in or near specific anatomical locations not easily accessible without such assistance. Exposure Time: Please see nurses notes. Contrast: Before injecting any contrast, we confirmed that the patient did not have an allergy to iodine, shellfish, or radiological contrast. Once satisfactory needle placement was completed at the desired level, radiological contrast was injected. Contrast injected under live fluoroscopy. No contrast complications. See chart for type and volume of contrast used. Fluoroscopic Guidance: I was personally present during the use of fluoroscopy. "Tunnel Vision Technique" used to obtain the best possible view of the target area. Parallax error corrected before commencing the procedure. "Direction-depth-direction" technique used to introduce the needle under continuous pulsed fluoroscopy. Once target was reached, antero-posterior, oblique, and lateral fluoroscopic projection used confirm needle placement in all  planes. Images permanently stored in EMR. Interpretation: I personally interpreted the imaging intraoperatively. Adequate needle placement confirmed in multiple planes. Appropriate spread of contrast into desired area was observed. No evidence of afferent or efferent intravascular uptake. No intrathecal or subarachnoid spread observed. Permanent images saved into the patient's record.  Antibiotic Prophylaxis:  Indication(s): None identified Antibiotic given: None  Post-operative Assessment:  EBL: None Complications: No immediate post-treatment complications observed by team, or reported by patient. Note: The patient tolerated the entire procedure well. A repeat set of vitals were taken after the procedure and the patient was kept under observation following institutional policy, for this type of procedure. Post-procedural neurological assessment was performed, showing return to baseline, prior to discharge. The patient was provided with post-procedure discharge instructions, including a section on how to identify potential problems. Should any problems arise concerning this procedure, the patient was given instructions to immediately contact us, at any time, without hesitation. In any case, we plan to contact the patient by telephone for a follow-up status report regarding this interventional procedure. Comments:  No additional relevant information.  Plan of Care  Disposition: Discharge home  Discharge Date & Time: 06/26/2016; 1046 hrs.  Physician-requested Follow-up:  Return in about 2 weeks (around 07/10/2016) for Post-Procedure evaluation.  Future Appointments Date Time Provider [place]  07/02/2016 1:30 PM Aundra Dubin, MD [facility] None  07/11/2016 1:30 PM Jerel Shepherd, LCSW [facility] None  07/22/2016 1:15 PM Milinda Pointer, MD [facility] None  09/16/2016 10:00 AM Crystal Dorrene German, NP [facility] None   Medications ordered for procedure: Meds ordered this encounter   Medications  . lactated ringers infusion 1,000 mL  . midazolam (VERSED) 5 MG/5ML injection 1-2 mg    Make sure Flumazenil is available in the pyxis when using this medication. If oversedation occurs, administer 0.2 mg IV over 15 sec. If after 45 sec no response, administer 0.2 mg again over 1 min; may repeat at 1 min intervals; not to exceed 4 doses (1 mg)  . fentaNYL (SUBLIMAZE) injection 25-50 mcg    Make sure Narcan is available in the pyxis when using this medication. In the event of respiratory depression (RR< 8/min): Titrate NARCAN (naloxone) in  increments of 0.1 to 0.2 mg IV at 2-3 minute intervals, until desired degree of reversal.  . bupivacaine-EPINEPHrine (MARCAINE W/ EPI) 0.25% -1:200000 (with pres) injection 10 mL  . dexamethasone (DECADRON) injection 10 mg  . iopamidol (ISOVUE-M) 41 % intrathecal injection 10 mL  . lidocaine (PF) (XYLOCAINE) 1 % injection 10 mL   Medications administered: We administered lactated ringers, midazolam, fentaNYL, dexamethasone, iopamidol, and lidocaine (PF).  See the medical record for exact dosing, route, and time of administration.  Lab-work, Procedure(s), & Referral(s) Ordered: Orders Placed This Encounter  Procedures  . DG C-Arm 1-60 Min-No Report  . Discharge instructions  . Follow-up  . Informed Consent Details: Transcribe to consent form and obtain patient signature  . Provider attestation of informed consent for procedure/surgical case  . Verify informed consent   Imaging Ordered: Results for orders placed during the hospital encounter of 05/22/16  DG C-Arm 1-60 Min-No Report   Narrative Fluoroscopy was utilized by the requesting physician.  No radiographic  interpretation.    New Prescriptions   No medications on file   Primary Care Physician: Boykin Nearing, MD Location: [facility] Note by: Kathlen Brunswick. Dossie Arbour, M.D, DABA, DABAPM, DABPM, DABIPP, FIPP Date: 06/26/2016; Time: 12:52  PM  Disclaimer:  Medicine is not an exact science. The only guarantee in medicine is that nothing is guaranteed. It is important to note that the decision to proceed with this intervention was based on the information collected from the patient. The Data and conclusions were drawn from the patient's questionnaire, the interview, and the physical examination. Because the information was provided in large part by the patient, it cannot be guaranteed that it has not been purposely or unconsciously manipulated. Every effort has been made to obtain as much relevant data as possible for this evaluation. It is important to note that the conclusions that lead to this procedure are derived in large part from the available data. Always take into account that the treatment will also be dependent on availability of resources and existing treatment guidelines, considered by other Pain Management Practitioners as being common knowledge and practice, at the time of the intervention. For Medico-Legal purposes, it is also important to point out that variation in procedural techniques and pharmacological choices are the acceptable norm. The indications, contraindications, technique, and results of the above procedure should only be interpreted and judged by a Board-Certified Interventional Pain Specialist with extensive familiarity and expertise in the same exact procedure and technique.   Instructions provided at this appointment: Patient Instructions  Post-Procedure instructions Instructions:  Apply ice: Fill a plastic sandwich bag with crushed ice. Cover it with a small towel and apply to injection site. Apply for 15 minutes then remove x 15 minutes. Repeat sequence on day of procedure, until you go to bed. The purpose is to minimize swelling and discomfort after procedure.  Apply heat: Apply heat to procedure site starting the day following the procedure. The purpose is to treat any soreness and discomfort from the  procedure.  Food intake: Start with clear liquids (like water) and advance to regular food, as tolerated.   Physical activities: Keep activities to a minimum for the first 8 hours after the procedure.   Driving: If you have received any sedation, you are not allowed to drive for 24 hours after your procedure.  Blood thinner: Restart your blood thinner 6 hours after your procedure. (Only for those taking blood thinners)  Insulin: As soon as you can eat, you  may resume your normal dosing schedule. (Only for those taking insulin)  Infection prevention: Keep procedure site clean and dry.  Post-procedure Pain Diary: Extremely important that this be done correctly and accurately. Recorded information will be used to determine the next step in treatment.  Pain evaluated is that of treated area only. Do not include pain from an untreated area.  Complete every hour, on the hour, for the initial 8 hours. Set an alarm to help you do this part accurately.  Do not go to sleep and have it completed later. It will not be accurate.  Follow-up appointment: Keep your follow-up appointment after the procedure. Usually 2 weeks for most procedures. (6 weeks in the case of radiofrequency.) Bring you pain diary.  Expect:  From numbing medicine (AKA: Local Anesthetics): Numbness or decrease in pain.  Onset: Full effect within 15 minutes of injected.  Duration: It will depend on the type of local anesthetic used. On the average, 1 to 8 hours.   From steroids: Decrease in swelling or inflammation. Once inflammation is improved, relief of the pain will follow.  Onset of benefits: Depends on the amount of swelling present. The more swelling, the longer it will take for the benefits to be seen.   Duration: Steroids will stay in the system x 2 weeks. Duration of benefits will depend on multiple posibilities including persistent irritating factors.  From procedure: Some discomfort is to be expected once the  numbing medicine wears off. This should be minimal if ice and heat are applied as instructed. Call if:  You experience numbness and weakness that gets worse with time, as opposed to wearing off.  New onset bowel or bladder incontinence. (Spinal procedures only)  Emergency Numbers:  Durning business hours (Monday - Thursday, 8:00 AM - 4:00 PM) (Friday, 9:00 AM - 12:00 Noon): [phone]  After hours: [phone]   __________________________________________________________________________________________   Pain Management Discharge Instructions  General Discharge Instructions :  If you need to reach your doctor call: Monday-Friday 8:00 am - 4:00 pm at [phone] or toll free [phone].  After clinic hours [phone] to have operator reach doctor.  Bring all of your medication bottles to all your appointments in the pain clinic.  To cancel or reschedule your appointment with Pain Management please remember to call 24 hours in advance to avoid a fee.  Refer to the educational materials which you have been given on: General Risks, I had my Procedure. Discharge Instructions, Post Sedation.  Post Procedure Instructions:  The drugs you were given will stay in your system until tomorrow, so for the next 24 hours you should not drive, make any legal decisions or drink any alcoholic beverages.  You may eat anything you prefer, but it is better to start with liquids then soups and crackers, and gradually work up to solid foods.  Please notify your doctor immediately if you have any unusual bleeding, trouble breathing or pain that is not related to your normal pain.  Depending on the type of procedure that was done, some parts of your body may feel week and/or numb.  This usually clears up by tonight or the next day.  Walk with the use of an assistive device or accompanied by an adult for the 24 hours.  You may use ice on the affected area for the first 24 hours.  Put ice  in a Ziploc bag and cover with a towel and place against area 15 minutes on 15 minutes off.  You may  switch to heat after 24 hours.

## 2016-06-27 ENCOUNTER — Telehealth: Payer: Self-pay

## 2016-06-27 NOTE — Telephone Encounter (Signed)
Post procedure phone call.  Patients step dad states he was asleep.  Will call back.

## 2016-06-27 NOTE — Telephone Encounter (Signed)
Post procedure phone call.  Patient states he is doing fine.  

## 2016-07-02 ENCOUNTER — Encounter (HOSPITAL_COMMUNITY): Payer: Self-pay | Admitting: Psychiatry

## 2016-07-02 ENCOUNTER — Ambulatory Visit (INDEPENDENT_AMBULATORY_CARE_PROVIDER_SITE_OTHER): Payer: Self-pay | Admitting: Psychiatry

## 2016-07-02 VITALS — BP 126/74 | HR 66 | Ht 76.0 in | Wt 217.2 lb

## 2016-07-02 DIAGNOSIS — F17210 Nicotine dependence, cigarettes, uncomplicated: Secondary | ICD-10-CM

## 2016-07-02 DIAGNOSIS — F411 Generalized anxiety disorder: Secondary | ICD-10-CM

## 2016-07-02 DIAGNOSIS — G629 Polyneuropathy, unspecified: Secondary | ICD-10-CM

## 2016-07-02 DIAGNOSIS — Z79899 Other long term (current) drug therapy: Secondary | ICD-10-CM

## 2016-07-02 DIAGNOSIS — Z811 Family history of alcohol abuse and dependence: Secondary | ICD-10-CM

## 2016-07-02 MED ORDER — GABAPENTIN 800 MG PO TABS: 800.0000 mg | ORAL_TABLET | Freq: Four times a day (QID) | ORAL | 1 refills | Status: DC

## 2016-07-02 MED ORDER — PROPRANOLOL HCL 10 MG PO TABS: 10.0000 mg | ORAL_TABLET | Freq: Three times a day (TID) | ORAL | 1 refills | Status: DC

## 2016-07-02 MED ORDER — TRAZODONE HCL 100 MG PO TABS: 100.0000 mg | ORAL_TABLET | Freq: Every evening | ORAL | 1 refills | Status: DC | PRN

## 2016-07-02 MED FILL — GABAPENTIN 800 MG TABLET: 800 | 30 days supply | Qty: 120 | Fill #0

## 2016-07-02 MED FILL — traZODone HCL 50 MG TABS: 50 | 30 days supply | Qty: 30 | Fill #1

## 2016-07-02 MED FILL — METOPROLOL TARTRATE 50 MG T: 50 | 30 days supply | Qty: 90 | Fill #1

## 2016-07-02 MED FILL — traZODone HCL 100 MG TABS: 100 | 30 days supply | Qty: 30 | Fill #0

## 2016-07-02 MED FILL — ?PROPRANOLOL 10 MG TABLET: 10 | 30 days supply | Qty: 90 | Fill #0

## 2016-07-02 NOTE — Progress Notes (Signed)
BH MD/PA/NP OP Progress Note  07/02/2016 2:08 PM Mark [name]  MRN:  [id number]  Chief Complaint:  Subjective:  Mark [name] is a 51 year old male with a psychiatric history of alcohol use disorder, IBS, anxiety, opiate dependence, self-reported history of bipolar 2, and chronic pain.  He presents as a transfer from Dr. Modesta Messing due to provider location change.    I spent time with the patient reviewing his past psychiatric medication trials. He is largely been unable to tolerate anything with serotonergic activity, as this tends to make him feel foggy and "odd". He has been tried on a multitude of SSRI and SNRI medications, atypical antidepressants and atypical antipsychotics.  He states frankly that Xanax and Ativan are the only things that have ever worked with him.  I told him that these are medications I don't prescribe often, especially when people of had addiction issues in the past.  He reports that his prime concern is that he feels restless and fidgety during the day, and describes a sensation similar akathisia. I discussed that he can be on propanolol 10 mg 3 times daily, and discussed the risks and benefits of this. I shared with him that he can use this medication 3 times daily as needed, but that he should try it initially for a few days to see how it affects him in terms of sedation and being able to make sure that he can drive and function during the day.  He continues to live with his mom and dad. He reports that he gets along well with them area he continues to struggle with chronic pain. He reports that his mood is actually pretty good, and he is looking forward to his first grandchild being born in the next week or so.  He denies any suicidality. Agrees to follow-up in 3 months or sooner if needed.  Visit Diagnosis:    ICD-9-CM ICD-10-CM   1. GAD (generalized anxiety disorder) 300.02 F41.1 propranolol (INDERAL) 10 MG tablet  2. Neuropathy, Peripheral (Feet) ([name]) (Location of  Primary Source of Pain) (Bilateral) (R>L) 355.9 G62.9 traZODone (DESYREL) 100 MG tablet     gabapentin (NEURONTIN) 800 MG tablet    Past Psychiatric History: See intake H&P for full details. Reviewed, with no updates at this time.   Past Medical History:  Past Medical History:  Diagnosis Date  . Alcohol withdrawal ([place])   . Allergy   . Anxiety Dx 2014  . COPD (chronic obstructive pulmonary disease) ([place])   . Drug abuse    pt reports opioid dependence due to previous back surgeries  . Enlarged prostate   . ETOH abuse   . GERD (gastroesophageal reflux disease) Dx 2003  . Headache(784.0)   . Hepatitis   . Hyperlipidemia Dx 2000  . Hypertension Dx 2011  . IBS (irritable bowel syndrome)    alternates constiaption/ diarrhea  . Irregular heart beat   . 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 Mental disorder   . Neuromuscular disorder ([place])    hands hurt   . Neuropathy 06/07/2013  . Seizure due to alcohol withdrawal ([place]) 01/23/2014   Per patient report  . Shortness of breath   . Withdrawal seizures ([place]) 2014   last seizure 2014 etoh with drawal    Past Surgical History:  Procedure Laterality Date  . [place] STUDY N/A 01/16/2015   Procedure: [place] STUDY;  Surgeon: Manus Gunning, MD;  Location: [facility];  Service: Gastroenterology;  Laterality: N/A;  .  [place]  . COLONOSCOPY  1998  . LEFT HEART CATHETERIZATION WITH CORONARY ANGIOGRAM N/A 01/13/2014   Procedure: LEFT HEART CATHETERIZATION WITH CORONARY ANGIOGRAM;  Surgeon: Clent Demark, MD;  Location: [facility];  Service: Cardiovascular;  Laterality: N/A;  . NASAL SINUS SURGERY    . NECK SURGERY  2000   cervial fusion   . [place] FUNDOPLICATION    . SUBACROMIAL DECOMPRESSION Right 11/16/2014   Procedure: RIGHT SHOULDER ARTHROSCOPY WITH SUBACROMIAL DECOMPRESSION ;  Surgeon: Leandrew Koyanagi, MD;  Location: [facility];  Service: Orthopedics;  Laterality: Right;  . UPPER GASTROINTESTINAL  ENDOSCOPY      Family Psychiatric History: See intake H&P for full details. Reviewed, with no updates at this time.   Family History:  Family History  Problem Relation Age of Onset  . Lung cancer Father     was a smoker  . Alcohol abuse Father   . Hypertension Mother   . Colon polyps Mother   . Breast cancer Paternal Grandmother   . Alcohol abuse Paternal Grandfather   . Colon cancer Neg Hx   . Rectal cancer Neg Hx   . Stomach cancer Neg Hx     Social History:  Social History   Social History  . Marital status: Divorced    Spouse name: N/A  . Number of children: 2  . Years of education: N/A   Occupational History  . [employer]     Social History Main Topics  . Smoking status: Current Every Day Smoker    Packs/day: 0.50    Years: 30.00    Types: Cigarettes  . Smokeless tobacco: Never Used     Comment: 11-30-2014 info given, Has cut back from 2 pks to 1/2 pack and "working on it"  . Alcohol use No     Comment: occ; binge drinking occasionally  . Drug use: No     Comment: Opana  . Sexual activity: Not Currently   Other Topics Concern  . None   Social History Narrative  . None    Allergies:  Allergies  Allergen Reactions  . Cozaar [Losartan] Anaphylaxis  . Lisinopril Anaphylaxis    Metabolic Disorder Labs: Lab Results  Component Value Date   HGBA1C 6.0 04/03/2015   MPG 128 (H) 01/22/2014   No results found for: PROLACTIN No results found for: CHOL, TRIG, HDL, CHOLHDL, VLDL, LDLCALC   Current Medications: Current Outpatient Prescriptions  Medication Sig Dispense Refill  . baclofen (LIORESAL) 10 MG tablet Take 1 tablet (10 mg total) by mouth 3 (three) times daily. 90 tablet 2  . cyclobenzaprine (FLEXERIL) 10 MG tablet Take 1 tablet (10 mg total) by mouth at bedtime. 30 tablet 2  . gabapentin (NEURONTIN) 800 MG tablet Take 1 tablet (800 mg total) by mouth 4 (four) times daily. 360 tablet 1  . gemfibrozil (LOPID) 600 MG tablet Take 600 mg by mouth 2  (two) times daily before a meal.    . losartan (COZAAR) 50 MG tablet Take 50 mg by mouth daily.    . metoprolol (LOPRESSOR) 50 MG tablet Take 1 tablet (50 mg total) by mouth 2 (two) times daily. 60 tablet 11  . traMADol (ULTRAM) 50 MG tablet Take 2 tablets (100 mg total) by mouth 4 (four) times daily. 1-2 tablets 240 tablet 2  . traZODone (DESYREL) 100 MG tablet Take 1 tablet (100 mg total) by mouth at bedtime as needed. 90 tablet 1  . VENTOLIN HFA 108 (90  Base) MCG/ACT inhaler INHALE 2 PUFFS INTO THE LUNGS EVERY 4 HOURS AS NEEDED FOR WHEEZING OR SHORTNESS OF BREATH. 18 g 0  . propranolol (INDERAL) 10 MG tablet Take 1 tablet (10 mg total) by mouth 3 (three) times daily. 90 tablet 1   No current facility-administered medications for this visit.     Neurologic: Headache: Negative Seizure: Negative Paresthesias: Negative  Musculoskeletal: Strength & Muscle Tone: within normal limits Gait & Station: normal Patient leans: N/A  Psychiatric Specialty Exam: ROS  Blood pressure 126/74, pulse 66, height 6\' 4"  (1.93 m), weight 217 lb 3.2 oz (98.5 kg).Body mass index is 26.44 kg/m.  General Appearance: Casual and Fairly Groomed  Eye Contact:  Good  Speech:  Clear and Coherent  Volume:  Normal  Mood:  Anxious  Affect:  Congruent  Thought Process:  Goal Directed  Orientation:  Full (Time, Place, and Person)  Thought Content: Logical   Suicidal Thoughts:  No  Homicidal Thoughts:  No  Memory:  Immediate;   Fair  Judgement:  Fair  Insight:  Shallow  Psychomotor Activity:  Normal  Concentration:  Concentration: Good  Recall:  Negative  Fund of Knowledge: Good  Language: Good  Akathisia:  Yes not on any AAP  Handed:  Right  AIMS (if indicated):  na  Assets:  Communication Skills Desire for Improvement Financial Resources/Insurance Housing Social Support Transportation  ADL's:  Intact  Cognition: WNL  Sleep:  4-5 hours    Treatment Plan Summary: NASSER KU is a 51 year old  male with a psychiatric history of alcohol use disorder, IBS, anxiety, opiate dependence, self-reported history of bipolar 2, and chronic pain. Presents today for medication management follow-up. This is my first visit with the patient. He presents with ongoing restlessness, and anxiety. He has poorly tolerated multiple SSRIs and atypical antidepressants, and atypical antipsychotics. He does not have any acute safety issues. Will proceed as below and follow up in 3 months.  1. GAD (generalized anxiety disorder)   2. Neuropathy, Peripheral (Feet) ([name]) (Location of Primary Source of Pain) (Bilateral) (R>L)    Continue trazodone, increase to 100 mg nightly Continue gabapentin 800 mg 4 times daily for anxiety and chronic pain Initiate propranolol 10 mg 3 times daily for anxiety and restlessness Avoid the use of benzodiazepines given history of alcohol addiction, and opiate addiction Follow-up in 3 months  Aundra Dubin, MD 07/02/2016, 2:08 PM

## 2016-07-10 ENCOUNTER — Ambulatory Visit: Payer: Self-pay | Attending: Family Medicine

## 2016-07-11 ENCOUNTER — Encounter (HOSPITAL_COMMUNITY): Payer: Self-pay | Admitting: Clinical

## 2016-07-11 ENCOUNTER — Ambulatory Visit (INDEPENDENT_AMBULATORY_CARE_PROVIDER_SITE_OTHER): Payer: Self-pay | Admitting: Clinical

## 2016-07-11 DIAGNOSIS — F319 Bipolar disorder, unspecified: Secondary | ICD-10-CM

## 2016-07-11 DIAGNOSIS — F1021 Alcohol dependence, in remission: Secondary | ICD-10-CM

## 2016-07-11 NOTE — Progress Notes (Signed)
   THERAPIST PROGRESS NOTE  Session Time: 1:27 - 2:25   Participation Level: Active  Behavioral Response: CasualDrowsyNA  Type of Therapy: Individual therapy  Treatment Goals addressed: Improve psychiatric symptoms, elevate mood ,  Mood stabilization  stress management skills, healthy coping skills  Interventions: motivational interviewing,  psychoeducation  Summary: Mark [name] is a 51 y.o. 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 Mark [name] discussed his psychiatric symptoms, his current life events, and his homework. Mark [name]  shared that he has been exhausted lately. Clinician asked open ended questions and Mark [name] shared that his daughter had her baby and he and the other family members have been taking turns helping his daughter adjust to being a new mother. Clinician asked open ended questions and Mark [name] shared that when he is engaging with others he is less focused on his pain and so less depressed. Client and clinician discussed his role in helping his daughter. Client and clinician discussed some hobbies that might allow him to focus and engage which would improve his depression and improve his concentration. He denied any alcohol relapses. Client and clinician discussed some stress management strategies he could use in his role of supportive grandfather.   Plan: Return in 1-2 weeks.  Diagnosis: Axis I: Bipolar I disorder, Alcohol use disorder, severe in early remission    Mark [name] A, LCSW 07/11/2016

## 2016-07-17 MED FILL — CYCLOBENZAPRINE 10 MG TAB: 10 | 30 days supply | Qty: 30 | Fill #1

## 2016-07-17 MED FILL — traMADol HCL 50 MG TABS: 50 | 30 days supply | Qty: 240 | Fill #1

## 2016-07-18 ENCOUNTER — Ambulatory Visit: Payer: Self-pay | Attending: Family Medicine

## 2016-07-22 ENCOUNTER — Encounter: Payer: Self-pay | Admitting: Pain Medicine

## 2016-07-22 ENCOUNTER — Ambulatory Visit: Payer: Self-pay | Attending: Pain Medicine | Admitting: Pain Medicine

## 2016-07-22 VITALS — BP 142/91 | Temp 98.8°F | Resp 16 | Ht 76.0 in | Wt 220.0 lb

## 2016-07-22 DIAGNOSIS — J449 Chronic obstructive pulmonary disease, unspecified: Secondary | ICD-10-CM | POA: Insufficient documentation

## 2016-07-22 DIAGNOSIS — M545 Low back pain: Secondary | ICD-10-CM | POA: Insufficient documentation

## 2016-07-22 DIAGNOSIS — F419 Anxiety disorder, unspecified: Secondary | ICD-10-CM | POA: Insufficient documentation

## 2016-07-22 DIAGNOSIS — M5441 Lumbago with sciatica, right side: Secondary | ICD-10-CM

## 2016-07-22 DIAGNOSIS — F319 Bipolar disorder, unspecified: Secondary | ICD-10-CM | POA: Insufficient documentation

## 2016-07-22 DIAGNOSIS — G629 Polyneuropathy, unspecified: Secondary | ICD-10-CM | POA: Insufficient documentation

## 2016-07-22 DIAGNOSIS — M25562 Pain in left knee: Secondary | ICD-10-CM | POA: Insufficient documentation

## 2016-07-22 DIAGNOSIS — N529 Male erectile dysfunction, unspecified: Secondary | ICD-10-CM | POA: Insufficient documentation

## 2016-07-22 DIAGNOSIS — M5442 Lumbago with sciatica, left side: Secondary | ICD-10-CM

## 2016-07-22 DIAGNOSIS — K589 Irritable bowel syndrome without diarrhea: Secondary | ICD-10-CM | POA: Insufficient documentation

## 2016-07-22 DIAGNOSIS — E559 Vitamin D deficiency, unspecified: Secondary | ICD-10-CM | POA: Insufficient documentation

## 2016-07-22 DIAGNOSIS — I10 Essential (primary) hypertension: Secondary | ICD-10-CM | POA: Insufficient documentation

## 2016-07-22 DIAGNOSIS — E785 Hyperlipidemia, unspecified: Secondary | ICD-10-CM | POA: Insufficient documentation

## 2016-07-22 DIAGNOSIS — Z79891 Long term (current) use of opiate analgesic: Secondary | ICD-10-CM | POA: Insufficient documentation

## 2016-07-22 DIAGNOSIS — N400 Enlarged prostate without lower urinary tract symptoms: Secondary | ICD-10-CM | POA: Insufficient documentation

## 2016-07-22 DIAGNOSIS — G8929 Other chronic pain: Secondary | ICD-10-CM

## 2016-07-22 DIAGNOSIS — Z8249 Family history of ischemic heart disease and other diseases of the circulatory system: Secondary | ICD-10-CM | POA: Insufficient documentation

## 2016-07-22 DIAGNOSIS — M961 Postlaminectomy syndrome, not elsewhere classified: Secondary | ICD-10-CM | POA: Insufficient documentation

## 2016-07-22 DIAGNOSIS — R5382 Chronic fatigue, unspecified: Secondary | ICD-10-CM | POA: Insufficient documentation

## 2016-07-22 DIAGNOSIS — Z811 Family history of alcohol abuse and dependence: Secondary | ICD-10-CM | POA: Insufficient documentation

## 2016-07-22 DIAGNOSIS — F1021 Alcohol dependence, in remission: Secondary | ICD-10-CM | POA: Insufficient documentation

## 2016-07-22 DIAGNOSIS — Z981 Arthrodesis status: Secondary | ICD-10-CM | POA: Insufficient documentation

## 2016-07-22 DIAGNOSIS — G894 Chronic pain syndrome: Secondary | ICD-10-CM | POA: Insufficient documentation

## 2016-07-22 DIAGNOSIS — M5416 Radiculopathy, lumbar region: Secondary | ICD-10-CM

## 2016-07-22 DIAGNOSIS — F411 Generalized anxiety disorder: Secondary | ICD-10-CM | POA: Insufficient documentation

## 2016-07-22 DIAGNOSIS — K219 Gastro-esophageal reflux disease without esophagitis: Secondary | ICD-10-CM | POA: Insufficient documentation

## 2016-07-22 DIAGNOSIS — Z801 Family history of malignant neoplasm of trachea, bronchus and lung: Secondary | ICD-10-CM | POA: Insufficient documentation

## 2016-07-22 DIAGNOSIS — M25561 Pain in right knee: Secondary | ICD-10-CM | POA: Insufficient documentation

## 2016-07-22 DIAGNOSIS — F17210 Nicotine dependence, cigarettes, uncomplicated: Secondary | ICD-10-CM | POA: Insufficient documentation

## 2016-07-22 DIAGNOSIS — Z79899 Other long term (current) drug therapy: Secondary | ICD-10-CM | POA: Insufficient documentation

## 2016-07-22 NOTE — Progress Notes (Deleted)
Safety precautions to be maintained throughout the outpatient stay will include: orient to surroundings, keep bed in low position, maintain call bell within reach at all times, provide assistance with transfer out of bed and ambulation.  

## 2016-07-22 NOTE — Patient Instructions (Addendum)
_______________________________________________________________  Preparing for Procedure with Sedation Instructions: . Oral Intake: Do not eat or drink anything for at least 8 hours prior to your procedure. . Transportation: Public transportation is not allowed. Bring an adult driver. The driver must be physically present in our waiting room before any procedure can be started. Marland Kitchen Physical Assistance: Bring an adult physically capable of assisting you, in the event you need help. This adult should keep you company at home for at least 6 hours after the procedure. . Blood Pressure Medicine: Take your blood pressure medicine with a sip of water the morning of the procedure. . Blood thinners:  . Diabetics on insulin: Notify the staff so that you can be scheduled 1st case in the morning. If your diabetes requires high dose insulin, take only  of your normal insulin dose the morning of the procedure and notify the staff that you have done so. . Preventing infections: Shower with an antibacterial soap the morning of your procedure. . Build-up your immune system: Take 1000 mg of Vitamin C with every meal (3 times a day) the day prior to your procedure. Marland Kitchen Antibiotics: Inform the staff if you have a condition or reason that requires you to take antibiotics before dental procedures. . Pregnancy: If you are pregnant, call and cancel the procedure. . Sickness: If you have a cold, fever, or any active infections, call and cancel the procedure. . Arrival: You must be in the facility at least 30 minutes prior to your scheduled procedure. . Children: Do not bring children with you. . Dress appropriately: Bring dark clothing that you would not mind if they get stained. . Valuables: Do not bring any jewelry or valuables. Procedure appointments are reserved for interventional treatments only. Marland Kitchen No Prescription Refills. . No medication changes will be discussed during procedure appointments. . No disability issues  will be discussed. ______________________________________________________________________________________________  Pain Management Discharge Instructions  General Discharge Instructions :  If you need to reach your doctor call: Monday-Friday 8:00 am - 4:00 pm at [phone] or toll free [phone].  After clinic hours [phone] to have operator reach doctor.  Bring all of your medication bottles to all your appointments in the pain clinic.  To cancel or reschedule your appointment with Pain Management please remember to call 24 hours in advance to avoid a fee.  Refer to the educational materials which you have been given on: General Risks, I had my Procedure. Discharge Instructions, Post Sedation.  Post Procedure Instructions:  The drugs you were given will stay in your system until tomorrow, so for the next 24 hours you should not drive, make any legal decisions or drink any alcoholic beverages.  You may eat anything you prefer, but it is better to start with liquids then soups and crackers, and gradually work up to solid foods.  Please notify your doctor immediately if you have any unusual bleeding, trouble breathing or pain that is not related to your normal pain.  Depending on the type of procedure that was done, some parts of your body may feel week and/or numb.  This usually clears up by tonight or the next day.  Walk with the use of an assistive device or accompanied by an adult for the 24 hours.  You may use ice on the affected area for the first 24 hours.  Put ice in a Ziploc bag and cover with a towel and place against area 15 minutes on 15 minutes off.  You may switch to heat after  24 hours.GENERAL RISKS AND COMPLICATIONS  What are the risk, side effects and possible complications? Generally speaking, most procedures are safe.  However, with any procedure there are risks, side effects, and the possibility of complications.  The risks and complications are dependent  upon the sites that are lesioned, or the type of nerve block to be performed.  The closer the procedure is to the spine, the more serious the risks are.  Great care is taken when placing the radio frequency needles, block needles or lesioning probes, but sometimes complications can occur. 1. Infection: Any time there is an injection through the skin, there is a risk of infection.  This is why sterile conditions are used for these blocks.  There are four possible types of infection. 1. Localized skin infection. 2. Central Nervous System Infection-This can be in the form of Meningitis, which can be deadly. 3. Epidural Infections-This can be in the form of an epidural abscess, which can cause pressure inside of the spine, causing compression of the spinal cord with subsequent paralysis. This would require an emergency surgery to decompress, and there are no guarantees that the patient would recover from the paralysis. 4. Discitis-This is an infection of the intervertebral discs.  It occurs in about 1% of discography procedures.  It is difficult to treat and it may lead to surgery.        2. Pain: the needles have to go through skin and soft tissues, will cause soreness.       3. Damage to internal structures:  The nerves to be lesioned may be near blood vessels or    other nerves which can be potentially damaged.       4. Bleeding: Bleeding is more common if the patient is taking blood thinners such as  aspirin, Coumadin, Ticiid, Plavix, etc., or if he/she have some genetic predisposition  such as hemophilia. Bleeding into the spinal canal can cause compression of the spinal  cord with subsequent paralysis.  This would require an emergency surgery to  decompress and there are no guarantees that the patient would recover from the  paralysis.       5. Pneumothorax:  Puncturing of a lung is a possibility, every time a needle is introduced in  the area of the chest or upper back.  Pneumothorax refers to free air  around the  collapsed lung(s), inside of the thoracic cavity (chest cavity).  Another two possible  complications related to a similar event would include: Hemothorax and Chylothorax.   These are variations of the Pneumothorax, where instead of air around the collapsed  lung(s), you may have blood or chyle, respectively.       6. Spinal headaches: They may occur with any procedures in the area of the spine.       7. Persistent CSF (Cerebro-Spinal Fluid) leakage: This is a rare problem, but may occur  with prolonged intrathecal or epidural catheters either due to the formation of a fistulous  track or a dural tear.       8. Nerve damage: By working so close to the spinal cord, there is always a possibility of  nerve damage, which could be as serious as a permanent spinal cord injury with  paralysis.       9. Death:  Although rare, severe deadly allergic reactions known as "Anaphylactic  reaction" can occur to any of the medications used.      10. Worsening of the symptoms:  We can always make  thing worse.  What are the chances of something like this happening? Chances of any of this occuring are extremely low.  By statistics, you have more of a chance of getting killed in a motor vehicle accident: while driving to the hospital than any of the above occurring .  Nevertheless, you should be aware that they are possibilities.  In general, it is similar to taking a shower.  Everybody knows that you can slip, hit your head and get killed.  Does that mean that you should not shower again?  Nevertheless always keep in mind that statistics do not mean anything if you happen to be on the wrong side of them.  Even if a procedure has a 1 (one) in a 1,000,000 (million) chance of going wrong, it you happen to be that one..Also, keep in mind that by statistics, you have more of a chance of having something go wrong when taking medications.  Who should not have this procedure? If you are on a blood thinning medication  (e.g. Coumadin, Plavix, see list of "Blood Thinners"), or if you have an active infection going on, you should not have the procedure.  If you are taking any blood thinners, please inform your physician.  How should I prepare for this procedure?  Do not eat or drink anything at least six hours prior to the procedure.  Bring a driver with you .  It cannot be a taxi.  Come accompanied by an adult that can drive you back, and that is strong enough to help you if your legs get weak or numb from the local anesthetic.  Take all of your medicines the morning of the procedure with just enough water to swallow them.  If you have diabetes, make sure that you are scheduled to have your procedure done first thing in the morning, whenever possible.  If you have diabetes, take only half of your insulin dose and notify our nurse that you have done so as soon as you arrive at the clinic.  If you are diabetic, but only take blood sugar pills (oral hypoglycemic), then do not take them on the morning of your procedure.  You may take them after you have had the procedure.  Do not take aspirin or any aspirin-containing medications, at least eleven (11) days prior to the procedure.  They may prolong bleeding.  Wear loose fitting clothing that may be easy to take off and that you would not mind if it got stained with Betadine or blood.  Do not wear any jewelry or perfume  Remove any nail coloring.  It will interfere with some of our monitoring equipment.  NOTE: Remember that this is not meant to be interpreted as a complete list of all possible complications.  Unforeseen problems may occur.  BLOOD THINNERS The following drugs contain aspirin or other products, which can cause increased bleeding during surgery and should not be taken for 2 weeks prior to and 1 week after surgery.  If you should need take something for relief of minor pain, you may take acetaminophen which is found in Tylenol,m Datril, Anacin-3  and Panadol. It is not blood thinner. The products listed below are.  Do not take any of the products listed below in addition to any listed on your instruction sheet.  A.P.C or A.P.C with Codeine Codeine Phosphate Capsules #3 Ibuprofen Ridaura  ABC compound Congesprin Imuran rimadil  Advil Cope Indocin Robaxisal  Alka-Seltzer Effervescent Pain Reliever and Antacid Coricidin or Coricidin-D  Indomethacin Rufen  Alka-Seltzer plus Cold Medicine Cosprin Ketoprofen S-A-C Tablets  Anacin Analgesic Tablets or Capsules Coumadin Korlgesic Salflex  Anacin Extra Strength Analgesic tablets or capsules CP-2 Tablets Lanoril Salicylate  Anaprox Cuprimine Capsules Levenox Salocol  Anexsia-D Dalteparin Magan Salsalate  Anodynos Darvon compound Magnesium Salicylate Sine-off  Ansaid Dasin Capsules Magsal Sodium Salicylate  Anturane Depen Capsules Marnal Soma  APF Arthritis pain formula Dewitt's Pills Measurin Stanback  Argesic Dia-Gesic Meclofenamic Sulfinpyrazone  Arthritis Bayer Timed Release Aspirin Diclofenac Meclomen Sulindac  Arthritis pain formula Anacin Dicumarol Medipren Supac  Analgesic (Safety coated) Arthralgen Diffunasal Mefanamic Suprofen  Arthritis Strength Bufferin Dihydrocodeine Mepro Compound Suprol  Arthropan liquid Dopirydamole Methcarbomol with Aspirin Synalgos  ASA tablets/Enseals Disalcid Micrainin Tagament  Ascriptin Doan's Midol Talwin  Ascriptin A/D Dolene Mobidin Tanderil  Ascriptin Extra Strength Dolobid Moblgesic Ticlid  Ascriptin with Codeine Doloprin or Doloprin with Codeine Momentum Tolectin  Asperbuf Duoprin Mono-gesic Trendar  Aspergum Duradyne Motrin or Motrin IB Triminicin  Aspirin plain, buffered or enteric coated Durasal Myochrisine Trigesic  Aspirin Suppositories Easprin Nalfon Trillsate  Aspirin with Codeine Ecotrin Regular or Extra Strength Naprosyn Uracel  Atromid-S Efficin Naproxen Ursinus  Auranofin Capsules Elmiron Neocylate Vanquish  Axotal Emagrin  Norgesic Verin  Azathioprine Empirin or Empirin with Codeine Normiflo Vitamin E  Azolid Emprazil Nuprin Voltaren  Bayer Aspirin plain, buffered or children's or timed BC Tablets or powders Encaprin Orgaran Warfarin Sodium  Buff-a-Comp Enoxaparin Orudis Zorpin  Buff-a-Comp with Codeine Equegesic Os-Cal-Gesic   Buffaprin Excedrin plain, buffered or Extra Strength Oxalid   Bufferin Arthritis Strength Feldene Oxphenbutazone   Bufferin plain or Extra Strength Feldene Capsules Oxycodone with Aspirin   Bufferin with Codeine Fenoprofen Fenoprofen Pabalate or Pabalate-SF   Buffets II Flogesic Panagesic   Buffinol plain or Extra Strength Florinal or Florinal with Codeine Panwarfarin   Buf-Tabs Flurbiprofen Penicillamine   Butalbital Compound Four-way cold tablets Penicillin   Butazolidin Fragmin Pepto-Bismol   Carbenicillin Geminisyn Percodan   Carna Arthritis Reliever Geopen Persantine   Carprofen Gold's salt Persistin   Chloramphenicol Goody's Phenylbutazone   Chloromycetin Haltrain Piroxlcam   Clmetidine heparin Plaquenil   Cllnoril Hyco-pap Ponstel   Clofibrate Hydroxy chloroquine Propoxyphen         Before stopping any of these medications, be sure to consult the physician who ordered them.  Some, such as Coumadin (Warfarin) are ordered to prevent or treat serious conditions such as "deep thrombosis", "pumonary embolisms", and other heart problems.  The amount of time that you may need off of the medication may also vary with the medication and the reason for which you were taking it.  If you are taking any of these medications, please make sure you notify your pain physician before you undergo any procedures.          Epidural Steroid Injection An epidural steroid injection is a shot of steroid medicine and numbing medicine that is given into the space between the spinal cord and the bones in your back (epidural space). The shot helps relieve pain caused by an irritated or swollen  nerve root. The amount of pain relief you get from the injection depends on what is causing the nerve to be swollen and irritated, and how long your pain lasts. You are more likely to benefit from this injection if your pain is strong and comes on suddenly rather than if you have had pain for a long time. Tell a health care provider about:  Any allergies you have.  All medicines you are  taking, including vitamins, herbs, eye drops, creams, and over-the-counter medicines.  Any problems you or family members have had with anesthetic medicines.  Any blood disorders you have.  Any surgeries you have had.  Any medical conditions you have.  Whether you are pregnant or may be pregnant. What are the risks? Generally, this is a safe procedure. However, problems may occur, including:  Headache.  Bleeding.  Infection.  Allergic reaction to medicines.  Damage to your nerves. What happens before the procedure? Staying hydrated  Follow instructions from your health care provider about hydration, which may include:  Up to 2 hours before the procedure - you may continue to drink clear liquids, such as water, clear fruit juice, black coffee, and plain tea. Eating and drinking restrictions  Follow instructions from your health care provider about eating and drinking, which may include:  8 hours before the procedure - stop eating heavy meals or foods such as meat, fried foods, or fatty foods.  6 hours before the procedure - stop eating light meals or foods, such as toast or cereal.  6 hours before the procedure - stop drinking milk or drinks that contain milk.  2 hours before the procedure - stop drinking clear liquids. Medicine   You may be given medicines to lower anxiety.  Ask your health care provider about:  Changing or stopping your regular medicines. This is especially important if you are taking diabetes medicines or blood thinners.  Taking medicines such as aspirin and  ibuprofen. These medicines can thin your blood. Do not take these medicines before your procedure if your health care provider instructs you not to. General instructions   Plan to have someone take you home from the hospital or clinic. What happens during the procedure?  You may receive a medicine to help you relax (sedative).  You will be asked to lie on your abdomen.  The injection site will be cleaned.  A numbing medicine (local anesthetic) will be used to numb the injection site.  A needle will be inserted through your skin into the epidural space. You may feel some discomfort when this happens. An X-ray machine will be used to make sure the needle is put as close as possible to the affected nerve.  A steroid medicine and a local anesthetic will be injected into the epidural space.  The needle will be removed.  A bandage (dressing) will be put over the injection site. What happens after the procedure?  Your blood pressure, heart rate, breathing rate, and blood oxygen level will be monitored until the medicines you were given have worn off.  Your arm or leg may feel weak or numb for a few hours.  The injection site may feel sore.  Do not drive for 24 hours if you received a sedative. This information is not intended to replace advice given to you by your health care provider. Make sure you discuss any questions you have with your health care provider. Document Released: 06/04/2007 Document Revised: 08/09/2015 Document Reviewed: 06/13/2015 [employer] Interactive Patient Education  2017 [employer].

## 2016-07-22 NOTE — Progress Notes (Signed)
Patient's Name: Mark [name]  MRN: [id number]  Referring Provider: Boykin Nearing, MD  DOB: 1965-05-28  PCP: Boykin Nearing, MD  DOS: 07/22/2016  Note by: Kathlen Brunswick. Dossie Arbour, MD  Service setting: Ambulatory outpatient  Specialty: Interventional Pain Management  Location: [facility] (AMB) Pain Management Facility    Patient type: Established   Primary Reason(s) for Visit: Encounter for post-procedure evaluation of chronic illness with mild to moderate exacerbation CC: Back Pain (lower); Leg Pain (both legs, right is worse); and Foot Pain (both, neuopathy)  HPI  [name] is a 51 y.o. year old, male patient, who comes today for a post-procedure evaluation. 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 ([place]); Alcohol intoxication ([place]); Chronic low back pain (Location of Secondary source of pain) (Bilateral) (R>L); Chronic pain syndrome; Paroxysmal VT ([place]); Opioid dependence ([place]); Long Q-T syndrome; Hypokalemia; Alcohol use disorder, severe, dependence ([place]); Bipolar depression ([place]); Neuropathy, Peripheral (Feet) ([name]) (Location of Primary Source of Pain) (Bilateral) (R>L); Chronic shoulder pain (Right); Chronic radicular low back pain (Location of Tertiary source of pain) (Right) (to calf); Closed fracture of 5th metacarpal; Chronic fatigue; Vitamin D insufficiency; Poor dentition; Chronic maxillary sinusitis; Tender prostate; Erectile dysfunction; Chronic anxiety; GERD (gastroesophageal reflux disease); Suicidal ideation; Bipolar affective disorder, currently depressed, moderate ([place]); Non-sustained ventricular tachycardia ([place]); GAD (generalized anxiety disorder); Alcohol use disorder, severe, in early remission ([place]); Long term current use of opiate analgesic; Long term prescription opiate use; History of fusion of cervical spine; Chronic neck pain (Bilateral)(R>L); Cervical fusion syndrome;  Failed back surgical syndrome; Chronic knee pain  (Bilateral) (R>L); Major depressive disorder, recurrent episode, moderate ([place]); Musculoskeletal pain; 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 His primarily concern today is the Back Pain (lower); Leg Pain (both legs, right is worse); and Foot Pain (both, neuopathy)  51 Assessment: Self-Reported Pain Score: 3 /10             Reported level is compatible with observation.       Pain Type: Chronic pain Pain Location: Back Pain Orientation: Lower Pain Descriptors / Indicators: Aching, Constant, Radiating, Sharp, Shooting, Throbbing (radiate to the butt cheeks) Pain Frequency: Constant  [name] comes in today for post-procedure evaluation after the treatment done on 06/26/2016.  Further details on both, my assessment(s), as well as the proposed treatment plan, please see below.  Post-Procedure Assessment  06/26/2016 Procedure: Diagnostic right L3 and L4 lumbar sympathetic block under fluoroscopic guidance and IV sedation Pre-procedure pain score:  4/10 Post-procedure pain score: 0/10 (100% relief) Influential Factors: BMI: 26.78 kg/m Intra-procedural challenges: None observed Assessment challenges: Results reported today are inconsistent with those reported on procedure day, immediately before discharge. Previously the patient had reported 100% relief of the pain, before leaving the facility Post-procedural side-effects, adverse reactions, or complications: None reported Reported issues: None  Sedation: Sedation provided. When no sedatives are used, the analgesic levels obtained are directly associated to the effectiveness of the local anesthetics. However, when sedation is provided, the level of analgesia obtained during the initial 1 hour  following the intervention, is believed to be the result of a combination of factors. These factors may include, but are not limited to: 1. The effectiveness of the local anesthetics used. 2. The effects of the analgesic(s) and/or anxiolytic(s) used. 3. The degree of discomfort experienced by the patient at  the time of the procedure. 4. The patients ability and reliability in recalling and recording the events. 5. The presence and influence of possible secondary gains and/or psychosocial factors. Reported result: Relief experienced during the 1st hour after the procedure: 80 % (Ultra-Short Term Relief) Interpretative annotation: Analgesia during this period is likely to be Local Anesthetic and/or IV Sedative (Analgesic/Anxiolitic) related.          Effects of local anesthetic: The analgesic effects attained during this period are directly associated to the localized infiltration of local anesthetics and therefore cary significant diagnostic value as to the etiological location, or anatomical origin, of the pain. Expected duration of relief is directly dependent on the pharmacodynamics of the local anesthetic used. Long-acting (4-6 hours) anesthetics used.  Reported result: Relief during the next 4 to 6 hour after the procedure: 80 % (Short-Term Relief) Interpretative annotation: Complete relief would suggest area to be the source of the pain.          Long-term benefit: Defined as the period of time past the expected duration of local anesthetics. With the possible exception of prolonged sympathetic blockade from the local anesthetics, benefits during this period are typically attributed to, or associated with, other factors such as analgesic sensory neuropraxia, antiinflammatory effects, or beneficial biochemical changes provided by agents other than the local anesthetics Reported result: Extended relief following procedure: 0 % (pain relief lasted approx one week, then the pain came back gradual)  (Long-Term Relief) Interpretative annotation: Good relief. This could suggest inflammation to be a significant component in the etiology to the pain.          Current benefits: Defined as persistent relief that continues at this point in time.   Reported results: Treated area: 0 %       Interpretative annotation: Ongoing benefits would suggest effective therapeutic approach  Interpretation: Results would suggest a successful diagnostic intervention.          Laboratory Chemistry  Inflammation Markers Lab Results  Component Value Date   CRP 0.9 03/18/2016   ESRSEDRATE 2 03/18/2016   (CRP: Acute Phase) (ESR: Chronic Phase) Renal Function Markers Lab Results  Component Value Date   BUN 5 (L) 05/15/2016   CREATININE 0.72 05/15/2016   GFRAA >60 05/15/2016   GFRNONAA >60 05/15/2016   Hepatic Function Markers Lab Results  Component Value Date   AST 30 05/15/2016   ALT 40 05/15/2016   ALBUMIN 4.7 05/15/2016   ALKPHOS 87 05/15/2016   HCVAB NEGATIVE 06/14/2012   Electrolytes Lab Results  Component Value Date   NA 138 05/15/2016   K 3.4 (L) 05/15/2016   CL 101 05/15/2016   CALCIUM 9.0 05/15/2016   MG 2.1 03/18/2016   Neuropathy Markers Lab Results  Component Value Date   VITAMINB12 244 03/18/2016   Bone Pathology Markers Lab Results  Component Value Date   ALKPHOS 87 05/15/2016   VD25OH 21 (L) 07/04/2014   25OHVITD1 35 03/18/2016   25OHVITD2 1.0 03/18/2016   25OHVITD3 34 03/18/2016   CALCIUM 9.0 05/15/2016   TESTOFREE 78.9 09/16/2014   TESTOSTERONE 365 09/16/2014   Coagulation Parameters Lab Results  Component Value Date   INR 1.04 06/07/2013   LABPROT 13.4 06/07/2013   PLT 298 05/15/2016   Cardiovascular Markers Lab Results  Component Value Date   BNP 23.9 02/27/2015   HGB 17.7 (H) 05/15/2016   HCT 49.2 05/15/2016   Note: Lab results reviewed.  Recent Diagnostic Imaging Review  Dg C-arm 1-60 Min-no Report  Result Date: 06/26/2016 Fluoroscopy was  utilized by the requesting physician.  No radiographic interpretation.   Note: Imaging results reviewed.          Meds  The patient has a current medication list which includes the following prescription(s): baclofen, cyclobenzaprine, gabapentin, gemfibrozil, losartan, metoprolol tartrate, propranolol, tramadol, trazodone, and ventolin hfa.  Current Outpatient Prescriptions on File Prior to Visit  Medication Sig  . baclofen (LIORESAL) 10 MG tablet Take 1 tablet (10 mg total) by mouth 3 (three) times daily.  . cyclobenzaprine (FLEXERIL) 10 MG tablet Take 1 tablet (10 mg total) by mouth at bedtime.  . gabapentin (NEURONTIN) 800 MG tablet Take 1 tablet (800 mg total) by mouth 4 (four) times daily.  Marland Kitchen gemfibrozil (LOPID) 600 MG tablet Take 600 mg by mouth 2 (two) times daily before a meal.  . losartan (COZAAR) 50 MG tablet Take 50 mg by mouth daily.  . metoprolol (LOPRESSOR) 50 MG tablet Take 1 tablet (50 mg total) by mouth 2 (two) times daily.  . propranolol (INDERAL) 10 MG tablet Take 1 tablet (10 mg total) by mouth 3 (three) times daily.  . traMADol (ULTRAM) 50 MG tablet Take 2 tablets (100 mg total) by mouth 4 (four) times daily. 1-2 tablets  . traZODone (DESYREL) 100 MG tablet Take 1 tablet (100 mg total) by mouth at bedtime as needed.  . VENTOLIN HFA 108 (90 Base) MCG/ACT inhaler INHALE 2 PUFFS INTO THE LUNGS EVERY 4 HOURS AS NEEDED FOR WHEEZING OR SHORTNESS OF BREATH.   No current facility-administered medications on file prior to visit.    ROS  Constitutional: Denies any fever or chills Gastrointestinal: No reported hemesis, hematochezia, vomiting, or acute GI distress Musculoskeletal: Denies any acute onset joint swelling, redness, loss of ROM, or weakness Neurological: No reported episodes of acute onset apraxia, aphasia, dysarthria, agnosia, amnesia, paralysis, loss of coordination, or loss of consciousness  Allergies  Mr. Zanetti is allergic to cozaar [losartan] and  lisinopril.  [place]  Drug: Mr. Lange  reports that he does not use drugs. Alcohol:  reports that he does not drink alcohol. Tobacco:  reports that he has been smoking Cigarettes.  He has a 15.00 pack-year smoking history. He has never used smokeless tobacco. Medical:  has a past medical history of Alcohol withdrawal ([place]); Allergy; Anxiety (Dx 2014); COPD (chronic obstructive pulmonary disease) ([place]); Drug abuse; Enlarged prostate; ETOH abuse; GERD (gastroesophageal reflux disease) (Dx 2003); Headache(784.0); Hepatitis; Hyperlipidemia (Dx 2000); Hypertension (Dx 2011); IBS (irritable bowel syndrome); Irregular heart beat; Long Q-T syndrome (01/23/2014); Mental disorder; Neuromuscular disorder ([place]); Neuropathy (06/07/2013); Seizure due to alcohol withdrawal ([place]) (01/23/2014); Shortness of breath; and Withdrawal seizures ([place]) (2014). Family: family history includes Alcohol abuse in his father and paternal grandfather; Breast cancer in his paternal grandmother; Colon polyps in his mother; Hypertension in his mother; Lung cancer in his father.  Past Surgical History:  Procedure Laterality Date  . [place] STUDY N/A 01/16/2015   Procedure: [place] STUDY;  Surgeon: Manus Gunning, MD;  Location: [facility];  Service: Gastroenterology;  Laterality: N/A;  . [place]  . COLONOSCOPY  1998  . LEFT HEART CATHETERIZATION WITH CORONARY ANGIOGRAM N/A 01/13/2014   Procedure: LEFT HEART CATHETERIZATION WITH CORONARY ANGIOGRAM;  Surgeon: Clent Demark, MD;  Location: [facility];  Service: Cardiovascular;  Laterality: N/A;  . NASAL SINUS SURGERY    . NECK SURGERY  2000   cervial fusion   . [place] FUNDOPLICATION    .  SUBACROMIAL DECOMPRESSION Right 11/16/2014   Procedure: RIGHT SHOULDER ARTHROSCOPY WITH SUBACROMIAL DECOMPRESSION ;  Surgeon: Leandrew Koyanagi, MD;  Location: [facility];  Service: Orthopedics;  Laterality: Right;  . UPPER GASTROINTESTINAL ENDOSCOPY      Constitutional Exam  General appearance: Well nourished, well developed, and well hydrated. In no apparent acute distress Vitals:   07/22/16 1313  BP: (!) 142/91  Resp: 16  Temp: 98.8 F (37.1 C)  SpO2: 98%  Weight: 220 lb (99.8 kg)  Height: '6\' 4"'  (1.93 m)   BMI Assessment: Estimated body mass index is 26.78 kg/m as calculated from the following:   Height as of this encounter: '6\' 4"'  (1.93 m).   Weight as of this encounter: 220 lb (99.8 kg).  BMI interpretation table: BMI level Category Range association with higher incidence of chronic pain  <18 kg/m2 Underweight   18.5-24.9 kg/m2 Ideal body weight   25-29.9 kg/m2 Overweight Increased incidence by 20%  30-34.9 kg/m2 Obese (Class I) Increased incidence by 68%  35-39.9 kg/m2 Severe obesity (Class II) Increased incidence by 136%  >40 kg/m2 Extreme obesity (Class III) Increased incidence by 254%   BMI Readings from Last 4 Encounters:  07/22/16 26.78 kg/m  06/26/16 26.17 kg/m  06/20/16 26.17 kg/m  05/22/16 26.78 kg/m   Wt Readings from Last 4 Encounters:  07/22/16 220 lb (99.8 kg)  06/26/16 215 lb (97.5 kg)  06/20/16 215 lb (97.5 kg)  05/22/16 220 lb (99.8 kg)  Psych/Mental status: Alert, oriented x 3 (person, place, & time)       Eyes: PERLA Respiratory: No evidence of acute respiratory distress  Cervical Spine Exam  Inspection: No masses, redness, or swelling Alignment: Symmetrical Functional ROM: Unrestricted ROM      Stability: No instability detected Muscle strength & Tone: Functionally intact Sensory: Unimpaired Palpation: No palpable anomalies              Upper Extremity (UE) Exam    Side: Right upper extremity  Side: Left upper extremity  Inspection: No masses, redness, swelling, or asymmetry. No contractures  Inspection: No masses, redness, swelling, or asymmetry. No contractures  Functional ROM: Unrestricted ROM          Functional ROM: Unrestricted ROM          Muscle strength & Tone: Functionally  intact  Muscle strength & Tone: Functionally intact  Sensory: Unimpaired  Sensory: Unimpaired  Palpation: No palpable anomalies              Palpation: No palpable anomalies              Specialized Test(s): Deferred         Specialized Test(s): Deferred          Thoracic Spine Exam  Inspection: No masses, redness, or swelling Alignment: Symmetrical Functional ROM: Unrestricted ROM Stability: No instability detected Sensory: Unimpaired Muscle strength & Tone: No palpable anomalies  Lumbar Spine Exam  Inspection: No masses, redness, or swelling Alignment: Symmetrical Functional ROM: Unrestricted ROM      Stability: No instability detected Muscle strength & Tone: Functionally intact Sensory: Unimpaired Palpation: No palpable anomalies       Provocative Tests: Lumbar Hyperextension and rotation test: evaluation deferred today       Patrick's Maneuver: evaluation deferred today                    Gait & Posture Assessment  Ambulation: Unassisted Gait: Relatively normal for age and body habitus Posture:  WNL   Lower Extremity Exam    Side: Right lower extremity  Side: Left lower extremity  Inspection: No masses, redness, swelling, or asymmetry. No contractures  Inspection: No masses, redness, swelling, or asymmetry. No contractures  Functional ROM: Unrestricted ROM          Functional ROM: Unrestricted ROM          Muscle strength & Tone: Functionally intact  Muscle strength & Tone: Functionally intact  Sensory: Unimpaired  Sensory: Unimpaired  Palpation: No palpable anomalies  Palpation: No palpable anomalies   Assessment  Primary Diagnosis & Pertinent Problem List: The primary encounter diagnosis was Chronic radicular low back pain (Location of Tertiary source of pain) (Right) (to calf). Diagnoses of Chronic low back pain (Location of Secondary source of pain) (Bilateral) (R>L), Failed back surgical syndrome, and Neuropathy, Peripheral (Feet) ([name]) (Location of Primary Source of  Pain) (Bilateral) (R>L) were also pertinent to this visit.  Status Diagnosis  Not improving   Persistent Unimproved 1. Chronic radicular low back pain (Location of Tertiary source of pain) (Right) (to calf)   2. Chronic low back pain (Location of Secondary source of pain) (Bilateral) (R>L)   3. Failed back surgical syndrome   4. Neuropathy, Peripheral (Feet) ([name]) (Location of Primary Source of Pain) (Bilateral) (R>L)     Problems updated and reviewed during this visit: No problems updated. Plan of Care  Pharmacotherapy (Medications Ordered): No orders of the defined types were placed in this encounter.  New Prescriptions   No medications on file   Medications administered today: Mr. Woolsey had no medications administered during this visit. Lab-work, procedure(s), and/or referral(s): Orders Placed This Encounter  Procedures  . Caudal Epidural Injection   Imaging and/or referral(s): None  Interventional therapies: Planned, scheduled, and/or pending:   Caudal ESI + Diagnostic Epidurogram   Considering:   Lower extremity EMG/PNCV. (Radiculopathy versus peripheral neuropathy) Upper extremity EMG/PNCV. Diagnostic caudal epidural steroid injection +diagnostic epidurogram Possible Racz procedure Diagnostic cervical epidural steroid injection  Diagnostic bilateral cervical facet block  Possible bilateral cervical facet RFA Diagnostic bilateral lumbar facet block  Possible bilateral lumbar facet RFA  Diagnostic right intra-articular shoulder joint injection Diagnostic right suprascapular nerve block Possible right suprascapular RFA Diagnostic bilateral intra-articular knee joint injection  Possible bilateral series of 5 Hyalgan knee injections  Diagnostic bilateral Genicular nerve block  Possible bilateral Genicular RFA    Palliative PRN treatment(s):   None at this time.    Provider-requested follow-up: Return for procedure (w/ sedation):, (ASAP), w/ MD.  Future  Appointments Date Time Provider [place]  07/29/2016 8:00 AM Milinda Pointer, MD [facility] None  08/13/2016 1:30 PM Jerel Shepherd, LCSW [facility] None  09/02/2016 3:00 PM Daron Offer Richard Miu, MD [facility] None  09/16/2016 10:00 AM Vevelyn Francois, NP [facility] None   Primary Care Physician: Boykin Nearing, MD Location: [facility] Note by: Kathlen Brunswick. Dossie Arbour, M.D, DABA, DABAPM, DABPM, DABIPP, FIPP Date: 07/22/2016; Time: 5:32 PM  Patient instructions provided during this appointment: Patient Instructions   _______________________________________________________________  Preparing for Procedure with Sedation Instructions: . Oral Intake: Do not eat or drink anything for at least 8 hours prior to your procedure. . Transportation: Public transportation is not allowed. Bring an adult driver. The driver must be physically present in our waiting room before any procedure can be started. Marland Kitchen Physical Assistance: Bring an adult physically capable of assisting you, in the event you need help. This adult should keep you company at home  for at least 6 hours after the procedure. . Blood Pressure Medicine: Take your blood pressure medicine with a sip of water the morning of the procedure. . Blood thinners:  . Diabetics on insulin: Notify the staff so that you can be scheduled 1st case in the morning. If your diabetes requires high dose insulin, take only  of your normal insulin dose the morning of the procedure and notify the staff that you have done so. . Preventing infections: Shower with an antibacterial soap the morning of your procedure. . Build-up your immune system: Take 1000 mg of Vitamin C with every meal (3 times a day) the day prior to your procedure. Marland Kitchen Antibiotics: Inform the staff if you have a condition or reason that requires you to take antibiotics before dental procedures. . Pregnancy: If you are pregnant, call and cancel the  procedure. . Sickness: If you have a cold, fever, or any active infections, call and cancel the procedure. . Arrival: You must be in the facility at least 30 minutes prior to your scheduled procedure. . Children: Do not bring children with you. . Dress appropriately: Bring dark clothing that you would not mind if they get stained. . Valuables: Do not bring any jewelry or valuables. Procedure appointments are reserved for interventional treatments only. Marland Kitchen No Prescription Refills. . No medication changes will be discussed during procedure appointments. . No disability issues will be discussed. ______________________________________________________________________________________________  Pain Management Discharge Instructions  General Discharge Instructions :  If you need to reach your doctor call: Monday-Friday 8:00 am - 4:00 pm at [phone] or toll free [phone].  After clinic hours [phone] to have operator reach doctor.  Bring all of your medication bottles to all your appointments in the pain clinic.  To cancel or reschedule your appointment with Pain Management please remember to call 24 hours in advance to avoid a fee.  Refer to the educational materials which you have been given on: General Risks, I had my Procedure. Discharge Instructions, Post Sedation.  Post Procedure Instructions:  The drugs you were given will stay in your system until tomorrow, so for the next 24 hours you should not drive, make any legal decisions or drink any alcoholic beverages.  You may eat anything you prefer, but it is better to start with liquids then soups and crackers, and gradually work up to solid foods.  Please notify your doctor immediately if you have any unusual bleeding, trouble breathing or pain that is not related to your normal pain.  Depending on the type of procedure that was done, some parts of your body may feel week and/or numb.  This usually clears up by tonight or the  next day.  Walk with the use of an assistive device or accompanied by an adult for the 24 hours.  You may use ice on the affected area for the first 24 hours.  Put ice in a Ziploc bag and cover with a towel and place against area 15 minutes on 15 minutes off.  You may switch to heat after 24 hours.GENERAL RISKS AND COMPLICATIONS  What are the risk, side effects and possible complications? Generally speaking, most procedures are safe.  However, with any procedure there are risks, side effects, and the possibility of complications.  The risks and complications are dependent upon the sites that are lesioned, or the type of nerve block to be performed.  The closer the procedure is to the spine, the more serious the risks are.  Great care is  taken when placing the radio frequency needles, block needles or lesioning probes, but sometimes complications can occur. 1. Infection: Any time there is an injection through the skin, there is a risk of infection.  This is why sterile conditions are used for these blocks.  There are four possible types of infection. 1. Localized skin infection. 2. Central Nervous System Infection-This can be in the form of Meningitis, which can be deadly. 3. Epidural Infections-This can be in the form of an epidural abscess, which can cause pressure inside of the spine, causing compression of the spinal cord with subsequent paralysis. This would require an emergency surgery to decompress, and there are no guarantees that the patient would recover from the paralysis. 4. Discitis-This is an infection of the intervertebral discs.  It occurs in about 1% of discography procedures.  It is difficult to treat and it may lead to surgery.        2. Pain: the needles have to go through skin and soft tissues, will cause soreness.       3. Damage to internal structures:  The nerves to be lesioned may be near blood vessels or    other nerves which can be potentially damaged.       4. Bleeding:  Bleeding is more common if the patient is taking blood thinners such as  aspirin, Coumadin, Ticiid, Plavix, etc., or if he/she have some genetic predisposition  such as hemophilia. Bleeding into the spinal canal can cause compression of the spinal  cord with subsequent paralysis.  This would require an emergency surgery to  decompress and there are no guarantees that the patient would recover from the  paralysis.       5. Pneumothorax:  Puncturing of a lung is a possibility, every time a needle is introduced in  the area of the chest or upper back.  Pneumothorax refers to free air around the  collapsed lung(s), inside of the thoracic cavity (chest cavity).  Another two possible  complications related to a similar event would include: Hemothorax and Chylothorax.   These are variations of the Pneumothorax, where instead of air around the collapsed  lung(s), you may have blood or chyle, respectively.       6. Spinal headaches: They may occur with any procedures in the area of the spine.       7. Persistent CSF (Cerebro-Spinal Fluid) leakage: This is a rare problem, but may occur  with prolonged intrathecal or epidural catheters either due to the formation of a fistulous  track or a dural tear.       8. Nerve damage: By working so close to the spinal cord, there is always a possibility of  nerve damage, which could be as serious as a permanent spinal cord injury with  paralysis.       9. Death:  Although rare, severe deadly allergic reactions known as "Anaphylactic  reaction" can occur to any of the medications used.      10. Worsening of the symptoms:  We can always make thing worse.  What are the chances of something like this happening? Chances of any of this occuring are extremely low.  By statistics, you have more of a chance of getting killed in a motor vehicle accident: while driving to the hospital than any of the above occurring .  Nevertheless, you should be aware that they are possibilities.  In  general, it is similar to taking a shower.  Everybody knows that you can slip,  hit your head and get killed.  Does that mean that you should not shower again?  Nevertheless always keep in mind that statistics do not mean anything if you happen to be on the wrong side of them.  Even if a procedure has a 1 (one) in a 1,000,000 (million) chance of going wrong, it you happen to be that one..Also, keep in mind that by statistics, you have more of a chance of having something go wrong when taking medications.  Who should not have this procedure? If you are on a blood thinning medication (e.g. Coumadin, Plavix, see list of "Blood Thinners"), or if you have an active infection going on, you should not have the procedure.  If you are taking any blood thinners, please inform your physician.  How should I prepare for this procedure?  Do not eat or drink anything at least six hours prior to the procedure.  Bring a driver with you .  It cannot be a taxi.  Come accompanied by an adult that can drive you back, and that is strong enough to help you if your legs get weak or numb from the local anesthetic.  Take all of your medicines the morning of the procedure with just enough water to swallow them.  If you have diabetes, make sure that you are scheduled to have your procedure done first thing in the morning, whenever possible.  If you have diabetes, take only half of your insulin dose and notify our nurse that you have done so as soon as you arrive at the clinic.  If you are diabetic, but only take blood sugar pills (oral hypoglycemic), then do not take them on the morning of your procedure.  You may take them after you have had the procedure.  Do not take aspirin or any aspirin-containing medications, at least eleven (11) days prior to the procedure.  They may prolong bleeding.  Wear loose fitting clothing that may be easy to take off and that you would not mind if it got stained with Betadine or  blood.  Do not wear any jewelry or perfume  Remove any nail coloring.  It will interfere with some of our monitoring equipment.  NOTE: Remember that this is not meant to be interpreted as a complete list of all possible complications.  Unforeseen problems may occur.  BLOOD THINNERS The following drugs contain aspirin or other products, which can cause increased bleeding during surgery and should not be taken for 2 weeks prior to and 1 week after surgery.  If you should need take something for relief of minor pain, you may take acetaminophen which is found in Tylenol,m Datril, Anacin-3 and Panadol. It is not blood thinner. The products listed below are.  Do not take any of the products listed below in addition to any listed on your instruction sheet.  A.P.C or A.P.C with Codeine Codeine Phosphate Capsules #3 Ibuprofen Ridaura  ABC compound Congesprin Imuran rimadil  Advil Cope Indocin Robaxisal  Alka-Seltzer Effervescent Pain Reliever and Antacid Coricidin or Coricidin-D  Indomethacin Rufen  Alka-Seltzer plus Cold Medicine Cosprin Ketoprofen S-A-C Tablets  Anacin Analgesic Tablets or Capsules Coumadin Korlgesic Salflex  Anacin Extra Strength Analgesic tablets or capsules CP-2 Tablets Lanoril Salicylate  Anaprox Cuprimine Capsules Levenox Salocol  Anexsia-D Dalteparin Magan Salsalate  Anodynos Darvon compound Magnesium Salicylate Sine-off  Ansaid Dasin Capsules Magsal Sodium Salicylate  Anturane Depen Capsules Marnal Soma  APF Arthritis pain formula Dewitt's Pills Measurin Stanback  Argesic Dia-Gesic Meclofenamic Sulfinpyrazone  Arthritis Bayer Timed Release Aspirin Diclofenac Meclomen Sulindac  Arthritis pain formula Anacin Dicumarol Medipren Supac  Analgesic (Safety coated) Arthralgen Diffunasal Mefanamic Suprofen  Arthritis Strength Bufferin Dihydrocodeine Mepro Compound Suprol  Arthropan liquid Dopirydamole Methcarbomol with Aspirin Synalgos  ASA tablets/Enseals Disalcid Micrainin  Tagament  Ascriptin Doan's Midol Talwin  Ascriptin A/D Dolene Mobidin Tanderil  Ascriptin Extra Strength Dolobid Moblgesic Ticlid  Ascriptin with Codeine Doloprin or Doloprin with Codeine Momentum Tolectin  Asperbuf Duoprin Mono-gesic Trendar  Aspergum Duradyne Motrin or Motrin IB Triminicin  Aspirin plain, buffered or enteric coated Durasal Myochrisine Trigesic  Aspirin Suppositories Easprin Nalfon Trillsate  Aspirin with Codeine Ecotrin Regular or Extra Strength Naprosyn Uracel  Atromid-S Efficin Naproxen Ursinus  Auranofin Capsules Elmiron Neocylate Vanquish  Axotal Emagrin Norgesic Verin  Azathioprine Empirin or Empirin with Codeine Normiflo Vitamin E  Azolid Emprazil Nuprin Voltaren  Bayer Aspirin plain, buffered or children's or timed BC Tablets or powders Encaprin Orgaran Warfarin Sodium  Buff-a-Comp Enoxaparin Orudis Zorpin  Buff-a-Comp with Codeine Equegesic Os-Cal-Gesic   Buffaprin Excedrin plain, buffered or Extra Strength Oxalid   Bufferin Arthritis Strength Feldene Oxphenbutazone   Bufferin plain or Extra Strength Feldene Capsules Oxycodone with Aspirin   Bufferin with Codeine Fenoprofen Fenoprofen Pabalate or Pabalate-SF   Buffets II Flogesic Panagesic   Buffinol plain or Extra Strength Florinal or Florinal with Codeine Panwarfarin   Buf-Tabs Flurbiprofen Penicillamine   Butalbital Compound Four-way cold tablets Penicillin   Butazolidin Fragmin Pepto-Bismol   Carbenicillin Geminisyn Percodan   Carna Arthritis Reliever Geopen Persantine   Carprofen Gold's salt Persistin   Chloramphenicol Goody's Phenylbutazone   Chloromycetin Haltrain Piroxlcam   Clmetidine heparin Plaquenil   Cllnoril Hyco-pap Ponstel   Clofibrate Hydroxy chloroquine Propoxyphen         Before stopping any of these medications, be sure to consult the physician who ordered them.  Some, such as Coumadin (Warfarin) are ordered to prevent or treat serious conditions such as "deep thrombosis", "pumonary  embolisms", and other heart problems.  The amount of time that you may need off of the medication may also vary with the medication and the reason for which you were taking it.  If you are taking any of these medications, please make sure you notify your pain physician before you undergo any procedures.          Epidural Steroid Injection An epidural steroid injection is a shot of steroid medicine and numbing medicine that is given into the space between the spinal cord and the bones in your back (epidural space). The shot helps relieve pain caused by an irritated or swollen nerve root. The amount of pain relief you get from the injection depends on what is causing the nerve to be swollen and irritated, and how long your pain lasts. You are more likely to benefit from this injection if your pain is strong and comes on suddenly rather than if you have had pain for a long time. Tell a health care provider about:  Any allergies you have.  All medicines you are taking, including vitamins, herbs, eye drops, creams, and over-the-counter medicines.  Any problems you or family members have had with anesthetic medicines.  Any blood disorders you have.  Any surgeries you have had.  Any medical conditions you have.  Whether you are pregnant or may be pregnant. What are the risks? Generally, this is a safe procedure. However, problems may occur, including:  Headache.  Bleeding.  Infection.  Allergic reaction to medicines.  Damage to  your nerves. What happens before the procedure? Staying hydrated  Follow instructions from your health care provider about hydration, which may include:  Up to 2 hours before the procedure - you may continue to drink clear liquids, such as water, clear fruit juice, black coffee, and plain tea. Eating and drinking restrictions  Follow instructions from your health care provider about eating and drinking, which may include:  8 hours before the procedure  - stop eating heavy meals or foods such as meat, fried foods, or fatty foods.  6 hours before the procedure - stop eating light meals or foods, such as toast or cereal.  6 hours before the procedure - stop drinking milk or drinks that contain milk.  2 hours before the procedure - stop drinking clear liquids. Medicine   You may be given medicines to lower anxiety.  Ask your health care provider about:  Changing or stopping your regular medicines. This is especially important if you are taking diabetes medicines or blood thinners.  Taking medicines such as aspirin and ibuprofen. These medicines can thin your blood. Do not take these medicines before your procedure if your health care provider instructs you not to. General instructions   Plan to have someone take you home from the hospital or clinic. What happens during the procedure?  You may receive a medicine to help you relax (sedative).  You will be asked to lie on your abdomen.  The injection site will be cleaned.  A numbing medicine (local anesthetic) will be used to numb the injection site.  A needle will be inserted through your skin into the epidural space. You may feel some discomfort when this happens. An X-ray machine will be used to make sure the needle is put as close as possible to the affected nerve.  A steroid medicine and a local anesthetic will be injected into the epidural space.  The needle will be removed.  A bandage (dressing) will be put over the injection site. What happens after the procedure?  Your blood pressure, heart rate, breathing rate, and blood oxygen level will be monitored until the medicines you were given have worn off.  Your arm or leg may feel weak or numb for a few hours.  The injection site may feel sore.  Do not drive for 24 hours if you received a sedative. This information is not intended to replace advice given to you by your health care provider. Make sure you discuss any  questions you have with your health care provider. Document Released: 06/04/2007 Document Revised: 08/09/2015 Document Reviewed: 06/13/2015 Elsevier Interactive Patient Education  2017 [employer].

## 2016-07-22 NOTE — Progress Notes (Signed)
Nursing Pain Medication Assessment:  Safety precautions to be maintained throughout the outpatient stay will include: orient to surroundings, keep bed in low position, maintain call bell within reach at all times, provide assistance with transfer out of bed and ambulation.  Medication Inspection Compliance: Pill count conducted under aseptic conditions, in front of the patient. Neither the pills nor the bottle was removed from the patient's sight at any time. Once count was completed pills were immediately returned to the patient in their original bottle.  Medication: Tramadol (Ultram) Pill/Patch Count: 219 of 240 pills remain Pill/Patch Appearance: Markings consistent with prescribed medication Bottle Appearance: Standard pharmacy container. Clearly labeled. Filled Date: 05 / 09 / 2018 Last Medication intake:  Today

## 2016-07-24 ENCOUNTER — Encounter: Payer: Self-pay | Admitting: Family Medicine

## 2016-07-29 ENCOUNTER — Encounter: Payer: Self-pay | Admitting: Pain Medicine

## 2016-07-29 ENCOUNTER — Ambulatory Visit
Admission: RE | Admit: 2016-07-29 | Discharge: 2016-07-29 | Disposition: A | Discharge status: Home / Self Care | Payer: Self-pay | Source: Ambulatory Visit | Attending: Pain Medicine | Admitting: Pain Medicine

## 2016-07-29 ENCOUNTER — Ambulatory Visit (HOSPITAL_BASED_OUTPATIENT_CLINIC_OR_DEPARTMENT_OTHER): Payer: Self-pay | Admitting: Pain Medicine

## 2016-07-29 VITALS — BP 140/92 | HR 54 | Temp 98.5°F | Resp 18 | Ht 76.0 in | Wt 220.0 lb

## 2016-07-29 DIAGNOSIS — M5441 Lumbago with sciatica, right side: Secondary | ICD-10-CM | POA: Insufficient documentation

## 2016-07-29 DIAGNOSIS — M79604 Pain in right leg: Secondary | ICD-10-CM

## 2016-07-29 DIAGNOSIS — G8929 Other chronic pain: Secondary | ICD-10-CM | POA: Insufficient documentation

## 2016-07-29 DIAGNOSIS — M79605 Pain in left leg: Secondary | ICD-10-CM

## 2016-07-29 DIAGNOSIS — M961 Postlaminectomy syndrome, not elsewhere classified: Secondary | ICD-10-CM

## 2016-07-29 DIAGNOSIS — M5442 Lumbago with sciatica, left side: Secondary | ICD-10-CM | POA: Insufficient documentation

## 2016-07-29 DIAGNOSIS — M5416 Radiculopathy, lumbar region: Secondary | ICD-10-CM

## 2016-07-29 MED ORDER — SODIUM CHLORIDE 0.9% FLUSH
2.0000 mL | Freq: Once | INTRAVENOUS | Status: AC
  Administered 2016-07-29: 10 mL

## 2016-07-29 MED ORDER — LACTATED RINGERS IV SOLN
1000.0000 mL | Freq: Once | INTRAVENOUS | Status: AC
  Administered 2016-07-29: 1000 mL via INTRAVENOUS

## 2016-07-29 MED ORDER — MIDAZOLAM HCL 5 MG/5ML IJ SOLN
1.0000 mg | INTRAMUSCULAR | Status: DC | PRN
  Administered 2016-07-29: 4 mg via INTRAVENOUS
  Filled 2016-07-29: qty 5

## 2016-07-29 MED ORDER — FENTANYL CITRATE (PF) 100 MCG/2ML IJ SOLN
25.0000 ug | INTRAMUSCULAR | Status: DC | PRN
  Administered 2016-07-29: 100 ug via INTRAVENOUS
  Filled 2016-07-29: qty 2

## 2016-07-29 MED ORDER — IOPAMIDOL (ISOVUE-M 200) INJECTION 41%
10.0000 mL | Freq: Once | INTRAMUSCULAR | Status: AC
  Administered 2016-07-29: 10 mL via EPIDURAL
  Filled 2016-07-29: qty 10

## 2016-07-29 MED ORDER — TRIAMCINOLONE ACETONIDE 40 MG/ML IJ SUSP
40.0000 mg | Freq: Once | INTRAMUSCULAR | Status: AC
  Administered 2016-07-29: 40 mg
  Filled 2016-07-29: qty 1

## 2016-07-29 MED ORDER — LIDOCAINE HCL (PF) 1 % IJ SOLN
10.0000 mL | Freq: Once | INTRAMUSCULAR | Status: AC
  Administered 2016-07-29: 5 mL
  Filled 2016-07-29: qty 10

## 2016-07-29 MED ORDER — ROPIVACAINE HCL 2 MG/ML IJ SOLN
2.0000 mL | Freq: Once | INTRAMUSCULAR | Status: AC
  Administered 2016-07-29: 10 mL via EPIDURAL
  Filled 2016-07-29: qty 10

## 2016-07-29 MED ORDER — SODIUM CHLORIDE 0.9 % IJ SOLN
INTRAMUSCULAR | Status: AC
Start: 2016-07-29 — End: 2016-07-29
  Filled 2016-07-29: qty 10

## 2016-07-29 NOTE — Progress Notes (Signed)
Safety precautions to be maintained throughout the outpatient stay will include: orient to surroundings, keep bed in low position, maintain call bell within reach at all times, provide assistance with transfer out of bed and ambulation.  

## 2016-07-29 NOTE — Patient Instructions (Addendum)
Post-Procedure instructions Instructions:  Apply ice: Fill a plastic sandwich bag with crushed ice. Cover it with a small towel and apply to injection site. Apply for 15 minutes then remove x 15 minutes. Repeat sequence on day of procedure, until you go to bed. The purpose is to minimize swelling and discomfort after procedure.  Apply heat: Apply heat to procedure site starting the day following the procedure. The purpose is to treat any soreness and discomfort from the procedure.  Food intake: Start with clear liquids (like water) and advance to regular food, as tolerated.   Physical activities: Keep activities to a minimum for the first 8 hours after the procedure.   Driving: If you have received any sedation, you are not allowed to drive for 24 hours after your procedure.  Blood thinner: Restart your blood thinner 6 hours after your procedure. (Only for those taking blood thinners)  Insulin: As soon as you can eat, you may resume your normal dosing schedule. (Only for those taking insulin)  Infection prevention: Keep procedure site clean and dry.  Post-procedure Pain Diary: Extremely important that this be done correctly and accurately. Recorded information will be used to determine the next step in treatment.  Pain evaluated is that of treated area only. Do not include pain from an untreated area.  Complete every hour, on the hour, for the initial 8 hours. Set an alarm to help you do this part accurately.  Do not go to sleep and have it completed later. It will not be accurate.  Follow-up appointment: Keep your follow-up appointment after the procedure. Usually 2 weeks for most procedures. (6 weeks in the case of radiofrequency.) Bring you pain diary.  Expect:  From numbing medicine (AKA: Local Anesthetics): Numbness or decrease in pain.  Onset: Full effect within 15 minutes of injected.  Duration: It will depend on the type of local anesthetic used. On the average, 1 to 8  hours.   From steroids: Decrease in swelling or inflammation. Once inflammation is improved, relief of the pain will follow.  Onset of benefits: Depends on the amount of swelling present. The more swelling, the longer it will take for the benefits to be seen.   Duration: Steroids will stay in the system x 2 weeks. Duration of benefits will depend on multiple posibilities including persistent irritating factors.  From procedure: Some discomfort is to be expected once the numbing medicine wears off. This should be minimal if ice and heat are applied as instructed. Call if:  You experience numbness and weakness that gets worse with time, as opposed to wearing off.  New onset bowel or bladder incontinence. (Spinal procedures only)  Emergency Numbers:  Durning business hours (Monday - Thursday, 8:00 AM - 4:00 PM) (Friday, 9:00 AM - 12:00 Noon): [phone]  After hours: [phone] _____________________________________________________________________________________________   Steps to Quit Smoking Smoking tobacco can be bad for your health. It can also affect almost every organ in your body. Smoking puts you and people around you at risk for many serious long-lasting (chronic) diseases. Quitting smoking is hard, but it is one of the best things that you can do for your health. It is never too late to quit. What are the benefits of quitting smoking? When you quit smoking, you lower your risk for getting serious diseases and conditions. They can include:  Lung cancer or lung disease.  Heart disease.  Stroke.  Heart attack.  Not being able to have children (infertility).  Weak bones (osteoporosis) and  broken bones (fractures). If you have coughing, wheezing, and shortness of breath, those symptoms may get better when you quit. You may also get sick less often. If you are pregnant, quitting smoking can help to lower your chances of having a baby of low birth weight. What can I do  to help me quit smoking? Talk with your doctor about what can help you quit smoking. Some things you can do (strategies) include:  Quitting smoking totally, instead of slowly cutting back how much you smoke over a period of time.  Going to in-person counseling. You are more likely to quit if you go to many counseling sessions.  Using resources and support systems, such as:  Online chats with a Social worker.  Phone quitlines.  Printed Furniture conservator/restorer.  Support groups or group counseling.  Text messaging programs.  Mobile phone apps or applications.  Taking medicines. Some of these medicines may have nicotine in them. If you are pregnant or breastfeeding, do not take any medicines to quit smoking unless your doctor says it is okay. Talk with your doctor about counseling or other things that can help you. Talk with your doctor about using more than one strategy at the same time, such as taking medicines while you are also going to in-person counseling. This can help make quitting easier. What things can I do to make it easier to quit? Quitting smoking might feel very hard at first, but there is a lot that you can do to make it easier. Take these steps:  Talk to your family and friends. Ask them to support and encourage you.  Call phone quitlines, reach out to support groups, or work with a Social worker.  Ask people who smoke to not smoke around you.  Avoid places that make you want (trigger) to smoke, such as:  Bars.  Parties.  Smoke-break areas at work.  Spend time with people who do not smoke.  Lower the stress in your life. Stress can make you want to smoke. Try these things to help your stress:  Getting regular exercise.  Deep-breathing exercises.  Yoga.  Meditating.  Doing a body scan. To do this, close your eyes, focus on one area of your body at a time from head to toe, and notice which parts of your body are tense. Try to relax the muscles in those  areas.  Download or buy apps on your mobile phone or tablet that can help you stick to your quit plan. There are many free apps, such as QuitGuide from the [employer] Office manager for Disease Control and Prevention). You can find more support from smokefree.gov and other websites. This information is not intended to replace advice given to you by your health care provider. Make sure you discuss any questions you have with your health care provider. Document Released: 12/22/2008 Document Revised: 10/24/2015 Document Reviewed: 07/12/2014 Elsevier Interactive Patient Education  2017 [place]. Pain Management Discharge Instructions  General Discharge Instructions :  If you need to reach your doctor call: Monday-Friday 8:00 am - 4:00 pm at [phone] or toll free [phone].  After clinic hours [phone] to have operator reach doctor.  Bring all of your medication bottles to all your appointments in the pain clinic.  To cancel or reschedule your appointment with Pain Management please remember to call 24 hours in advance to avoid a fee.  Refer to the educational materials which you have been given on: General Risks, I had my Procedure. Discharge Instructions, Post Sedation.  Post Procedure Instructions:  The drugs you were given will stay in your system until tomorrow, so for the next 24 hours you should not drive, make any legal decisions or drink any alcoholic beverages.  You may eat anything you prefer, but it is better to start with liquids then soups and crackers, and gradually work up to solid foods.  Please notify your doctor immediately if you have any unusual bleeding, trouble breathing or pain that is not related to your normal pain.  Depending on the type of procedure that was done, some parts of your body may feel week and/or numb.  This usually clears up by tonight or the next day.  Walk with the use of an assistive device or accompanied by an adult for the 24 hours.  You may  use ice on the affected area for the first 24 hours.  Put ice in a Ziploc bag and cover with a towel and place against area 15 minutes on 15 minutes off.  You may switch to heat after 24 hours.GENERAL RISKS AND COMPLICATIONS  What are the risk, side effects and possible complications? Generally speaking, most procedures are safe.  However, with any procedure there are risks, side effects, and the possibility of complications.  The risks and complications are dependent upon the sites that are lesioned, or the type of nerve block to be performed.  The closer the procedure is to the spine, the more serious the risks are.  Great care is taken when placing the radio frequency needles, block needles or lesioning probes, but sometimes complications can occur. 1. Infection: Any time there is an injection through the skin, there is a risk of infection.  This is why sterile conditions are used for these blocks.  There are four possible types of infection. 1. Localized skin infection. 2. Central Nervous System Infection-This can be in the form of Meningitis, which can be deadly. 3. Epidural Infections-This can be in the form of an epidural abscess, which can cause pressure inside of the spine, causing compression of the spinal cord with subsequent paralysis. This would require an emergency surgery to decompress, and there are no guarantees that the patient would recover from the paralysis. 4. Discitis-This is an infection of the intervertebral discs.  It occurs in about 1% of discography procedures.  It is difficult to treat and it may lead to surgery.        2. Pain: the needles have to go through skin and soft tissues, will cause soreness.       3. Damage to internal structures:  The nerves to be lesioned may be near blood vessels or    other nerves which can be potentially damaged.       4. Bleeding: Bleeding is more common if the patient is taking blood thinners such as  aspirin, Coumadin, Ticiid, Plavix,  etc., or if he/she have some genetic predisposition  such as hemophilia. Bleeding into the spinal canal can cause compression of the spinal  cord with subsequent paralysis.  This would require an emergency surgery to  decompress and there are no guarantees that the patient would recover from the  paralysis.       5. Pneumothorax:  Puncturing of a lung is a possibility, every time a needle is introduced in  the area of the chest or upper back.  Pneumothorax refers to free air around the  collapsed lung(s), inside of the thoracic cavity (chest cavity).  Another two possible  complications related to a similar event would  include: Hemothorax and Chylothorax.   These are variations of the Pneumothorax, where instead of air around the collapsed  lung(s), you may have blood or chyle, respectively.       6. Spinal headaches: They may occur with any procedures in the area of the spine.       7. Persistent CSF (Cerebro-Spinal Fluid) leakage: This is a rare problem, but may occur  with prolonged intrathecal or epidural catheters either due to the formation of a fistulous  track or a dural tear.       8. Nerve damage: By working so close to the spinal cord, there is always a possibility of  nerve damage, which could be as serious as a permanent spinal cord injury with  paralysis.       9. Death:  Although rare, severe deadly allergic reactions known as "Anaphylactic  reaction" can occur to any of the medications used.      10. Worsening of the symptoms:  We can always make thing worse.  What are the chances of something like this happening? Chances of any of this occuring are extremely low.  By statistics, you have more of a chance of getting killed in a motor vehicle accident: while driving to the hospital than any of the above occurring .  Nevertheless, you should be aware that they are possibilities.  In general, it is similar to taking a shower.  Everybody knows that you can slip, hit your head and get killed.   Does that mean that you should not shower again?  Nevertheless always keep in mind that statistics do not mean anything if you happen to be on the wrong side of them.  Even if a procedure has a 1 (one) in a 1,000,000 (million) chance of going wrong, it you happen to be that one..Also, keep in mind that by statistics, you have more of a chance of having something go wrong when taking medications.  Who should not have this procedure? If you are on a blood thinning medication (e.g. Coumadin, Plavix, see list of "Blood Thinners"), or if you have an active infection going on, you should not have the procedure.  If you are taking any blood thinners, please inform your physician.  How should I prepare for this procedure?  Do not eat or drink anything at least six hours prior to the procedure.  Bring a driver with you .  It cannot be a taxi.  Come accompanied by an adult that can drive you back, and that is strong enough to help you if your legs get weak or numb from the local anesthetic.  Take all of your medicines the morning of the procedure with just enough water to swallow them.  If you have diabetes, make sure that you are scheduled to have your procedure done first thing in the morning, whenever possible.  If you have diabetes, take only half of your insulin dose and notify our nurse that you have done so as soon as you arrive at the clinic.  If you are diabetic, but only take blood sugar pills (oral hypoglycemic), then do not take them on the morning of your procedure.  You may take them after you have had the procedure.  Do not take aspirin or any aspirin-containing medications, at least eleven (11) days prior to the procedure.  They may prolong bleeding.  Wear loose fitting clothing that may be easy to take off and that you would not mind if it got stained  with Betadine or blood.  Do not wear any jewelry or perfume  Remove any nail coloring.  It will interfere with some of our monitoring  equipment.  NOTE: Remember that this is not meant to be interpreted as a complete list of all possible complications.  Unforeseen problems may occur.  BLOOD THINNERS The following drugs contain aspirin or other products, which can cause increased bleeding during surgery and should not be taken for 2 weeks prior to and 1 week after surgery.  If you should need take something for relief of minor pain, you may take acetaminophen which is found in Tylenol,m Datril, Anacin-3 and Panadol. It is not blood thinner. The products listed below are.  Do not take any of the products listed below in addition to any listed on your instruction sheet.  A.P.C or A.P.C with Codeine Codeine Phosphate Capsules #3 Ibuprofen Ridaura  ABC compound Congesprin Imuran rimadil  Advil Cope Indocin Robaxisal  Alka-Seltzer Effervescent Pain Reliever and Antacid Coricidin or Coricidin-D  Indomethacin Rufen  Alka-Seltzer plus Cold Medicine Cosprin Ketoprofen S-A-C Tablets  Anacin Analgesic Tablets or Capsules Coumadin Korlgesic Salflex  Anacin Extra Strength Analgesic tablets or capsules CP-2 Tablets Lanoril Salicylate  Anaprox Cuprimine Capsules Levenox Salocol  Anexsia-D Dalteparin Magan Salsalate  Anodynos Darvon compound Magnesium Salicylate Sine-off  Ansaid Dasin Capsules Magsal Sodium Salicylate  Anturane Depen Capsules Marnal Soma  APF Arthritis pain formula Dewitt's Pills Measurin Stanback  Argesic Dia-Gesic Meclofenamic Sulfinpyrazone  Arthritis Bayer Timed Release Aspirin Diclofenac Meclomen Sulindac  Arthritis pain formula Anacin Dicumarol Medipren Supac  Analgesic (Safety coated) Arthralgen Diffunasal Mefanamic Suprofen  Arthritis Strength Bufferin Dihydrocodeine Mepro Compound Suprol  Arthropan liquid Dopirydamole Methcarbomol with Aspirin Synalgos  ASA tablets/Enseals Disalcid Micrainin Tagament  Ascriptin Doan's Midol Talwin  Ascriptin A/D Dolene Mobidin Tanderil  Ascriptin Extra Strength Dolobid  Moblgesic Ticlid  Ascriptin with Codeine Doloprin or Doloprin with Codeine Momentum Tolectin  Asperbuf Duoprin Mono-gesic Trendar  Aspergum Duradyne Motrin or Motrin IB Triminicin  Aspirin plain, buffered or enteric coated Durasal Myochrisine Trigesic  Aspirin Suppositories Easprin Nalfon Trillsate  Aspirin with Codeine Ecotrin Regular or Extra Strength Naprosyn Uracel  Atromid-S Efficin Naproxen Ursinus  Auranofin Capsules Elmiron Neocylate Vanquish  Axotal Emagrin Norgesic Verin  Azathioprine Empirin or Empirin with Codeine Normiflo Vitamin E  Azolid Emprazil Nuprin Voltaren  Bayer Aspirin plain, buffered or children's or timed BC Tablets or powders Encaprin Orgaran Warfarin Sodium  Buff-a-Comp Enoxaparin Orudis Zorpin  Buff-a-Comp with Codeine Equegesic Os-Cal-Gesic   Buffaprin Excedrin plain, buffered or Extra Strength Oxalid   Bufferin Arthritis Strength Feldene Oxphenbutazone   Bufferin plain or Extra Strength Feldene Capsules Oxycodone with Aspirin   Bufferin with Codeine Fenoprofen Fenoprofen Pabalate or Pabalate-SF   Buffets II Flogesic Panagesic   Buffinol plain or Extra Strength Florinal or Florinal with Codeine Panwarfarin   Buf-Tabs Flurbiprofen Penicillamine   Butalbital Compound Four-way cold tablets Penicillin   Butazolidin Fragmin Pepto-Bismol   Carbenicillin Geminisyn Percodan   Carna Arthritis Reliever Geopen Persantine   Carprofen Gold's salt Persistin   Chloramphenicol Goody's Phenylbutazone   Chloromycetin Haltrain Piroxlcam   Clmetidine heparin Plaquenil   Cllnoril Hyco-pap Ponstel   Clofibrate Hydroxy chloroquine Propoxyphen         Before stopping any of these medications, be sure to consult the physician who ordered them.  Some, such as Coumadin (Warfarin) are ordered to prevent or treat serious conditions such as "deep thrombosis", "pumonary embolisms", and other heart problems.  The amount of time that you  may need off of the medication may also vary with  the medication and the reason for which you were taking it.  If you are taking any of these medications, please make sure you notify your pain physician before you undergo any procedures.          Epidural Steroid Injection An epidural steroid injection is a shot of steroid medicine and numbing medicine that is given into the space between the spinal cord and the bones in your back (epidural space). The shot helps relieve pain caused by an irritated or swollen nerve root. The amount of pain relief you get from the injection depends on what is causing the nerve to be swollen and irritated, and how long your pain lasts. You are more likely to benefit from this injection if your pain is strong and comes on suddenly rather than if you have had pain for a long time. Tell a health care provider about:  Any allergies you have.  All medicines you are taking, including vitamins, herbs, eye drops, creams, and over-the-counter medicines.  Any problems you or family members have had with anesthetic medicines.  Any blood disorders you have.  Any surgeries you have had.  Any medical conditions you have.  Whether you are pregnant or may be pregnant. What are the risks? Generally, this is a safe procedure. However, problems may occur, including:  Headache.  Bleeding.  Infection.  Allergic reaction to medicines.  Damage to your nerves. What happens before the procedure? Staying hydrated  Follow instructions from your health care provider about hydration, which may include:  Up to 2 hours before the procedure - you may continue to drink clear liquids, such as water, clear fruit juice, black coffee, and plain tea. Eating and drinking restrictions  Follow instructions from your health care provider about eating and drinking, which may include:  8 hours before the procedure - stop eating heavy meals or foods such as meat, fried foods, or fatty foods.  6 hours before the procedure - stop  eating light meals or foods, such as toast or cereal.  6 hours before the procedure - stop drinking milk or drinks that contain milk.  2 hours before the procedure - stop drinking clear liquids. Medicine   You may be given medicines to lower anxiety.  Ask your health care provider about:  Changing or stopping your regular medicines. This is especially important if you are taking diabetes medicines or blood thinners.  Taking medicines such as aspirin and ibuprofen. These medicines can thin your blood. Do not take these medicines before your procedure if your health care provider instructs you not to. General instructions   Plan to have someone take you home from the hospital or clinic. What happens during the procedure?  You may receive a medicine to help you relax (sedative).  You will be asked to lie on your abdomen.  The injection site will be cleaned.  A numbing medicine (local anesthetic) will be used to numb the injection site.  A needle will be inserted through your skin into the epidural space. You may feel some discomfort when this happens. An X-ray machine will be used to make sure the needle is put as close as possible to the affected nerve.  A steroid medicine and a local anesthetic will be injected into the epidural space.  The needle will be removed.  A bandage (dressing) will be put over the injection site. What happens after the procedure?  Your blood pressure,  heart rate, breathing rate, and blood oxygen level will be monitored until the medicines you were given have worn off.  Your arm or leg may feel weak or numb for a few hours.  The injection site may feel sore.  Do not drive for 24 hours if you received a sedative. This information is not intended to replace advice given to you by your health care provider. Make sure you discuss any questions you have with your health care provider. Document Released: 06/04/2007 Document Revised: 08/09/2015 Document  Reviewed: 06/13/2015 Elsevier Interactive Patient Education  2017 [employer].

## 2016-07-29 NOTE — Progress Notes (Signed)
Patient's Name: Mark [name]  MRN: [id number]  Referring Provider: Boykin Nearing, MD  DOB: Dec 01, 1965  PCP: Boykin Nearing, MD  DOS: 07/29/2016  Note by: Kathlen Brunswick. Dossie Arbour, MD  Service setting: Ambulatory outpatient  Location: [facility] (AMB) Pain Management Facility  Visit type: Procedure  Specialty: Interventional Pain Management  Patient type: Established   Primary Reason for Visit: Interventional Pain Management Treatment. CC: Back Pain (low)  Procedure:  Anesthesia, Analgesia, Anxiolysis:  Type: Diagnostic Epidural Steroid Injection + Diagnostic Epidurogram Region: Caudal Level: Sacrococcygeal   Laterality: Midline aiming at the right  Type: Local Anesthesia with Moderate (Conscious) Sedation Local Anesthetic: Lidocaine 1% Route: Intravenous (IV) IV Access: Secured Sedation: Meaningful verbal contact was maintained at all times during the procedure  Indication(s): Analgesia and Anxiety  Indications: 1. Chronic pain of lower extremity (Bilateral) (R>L)   2. Chronic low back pain (Location of Secondary source of pain) (Bilateral) (R>L)   3. Chronic radicular low back pain (Location of Tertiary source of pain) (Right) (to calf)   4. Failed back surgical syndrome    Pain Score: Pre-procedure: 4 /10 Post-procedure: 0-No pain/10  Pre-op Assessment:  Previous date of service: 07/22/16 Service provided: Evaluation [name] is a 51 y.o. (year old), male patient, seen today for interventional treatment. He  has a past surgical history that includes Back surgery (1995, 1999); Neck surgery (2000); Nasal sinus surgery; left heart catheterization with coronary angiogram (N/A, 01/13/2014); Subacromial decompression (Right, 11/16/2014); Nissen fundoplication; 24 hour ph study (N/A, 01/16/2015); Upper gastrointestinal endoscopy; and Colonoscopy (1998). His primarily concern today is the Back Pain (low)  Initial Vital Signs: Height 6\' 4"  (1.93 m), weight 220 lb (99.8 kg). BMI: 26.78  kg/m  Risk Assessment: Allergies: Reviewed. He is allergic to cozaar [losartan] and lisinopril.  Allergy Precautions: None required Coagulopathies: Reviewed. None identified.  Blood-thinner therapy: None at this time Active Infection(s): Reviewed. None identified. Mark [name] is afebrile  Site Confirmation: Mark [name] was asked to confirm the procedure and laterality before marking the site Procedure checklist: Completed Consent: Before the procedure and under the influence of no sedative(s), amnesic(s), or anxiolytics, the patient was informed of the treatment options, risks and possible complications. To fulfill our ethical and legal obligations, as recommended by the American Medical Association's Code of Ethics, I have informed the patient of my clinical impression; the nature and purpose of the treatment or procedure; the risks, benefits, and possible complications of the intervention; the alternatives, including doing nothing; the risk(s) and benefit(s) of the alternative treatment(s) or procedure(s); and the risk(s) and benefit(s) of doing nothing. The patient was provided information about the general risks and possible complications associated with the procedure. These may include, but are not limited to: failure to achieve desired goals, infection, bleeding, organ or nerve damage, allergic reactions, paralysis, and death. In addition, the patient was informed of those risks and complications associated to Spine-related procedures, such as failure to decrease pain; infection (i.e.: Meningitis, epidural or intraspinal abscess); bleeding (i.e.: epidural hematoma, subarachnoid hemorrhage, or any other type of intraspinal or peri-dural bleeding); organ or nerve damage (i.e.: Any type of peripheral nerve, nerve root, or spinal cord injury) with subsequent damage to sensory, motor, and/or autonomic systems, resulting in permanent pain, numbness, and/or weakness of one or several areas of the  body; allergic reactions; (i.e.: anaphylactic reaction); and/or death. Furthermore, the patient was informed of those risks and complications associated with the medications. These include, but are not limited to: allergic reactions (i.e.: anaphylactic or  anaphylactoid reaction(s)); adrenal axis suppression; blood sugar elevation that in diabetics may result in ketoacidosis or comma; water retention that in patients with history of congestive heart failure may result in shortness of breath, pulmonary edema, and decompensation with resultant heart failure; weight gain; swelling or edema; medication-induced neural toxicity; particulate matter embolism and blood vessel occlusion with resultant organ, and/or nervous system infarction; and/or aseptic necrosis of one or more joints. Finally, the patient was informed that Medicine is not an exact science; therefore, there is also the possibility of unforeseen or unpredictable risks and/or possible complications that may result in a catastrophic outcome. The patient indicated having understood very clearly. We have given the patient no guarantees and we have made no promises. Enough time was given to the patient to ask questions, all of which were answered to the patient's satisfaction. Mark [name] has indicated that he wanted to continue with the procedure. Attestation: I, the ordering provider, attest that I have discussed with the patient the benefits, risks, side-effects, alternatives, likelihood of achieving goals, and potential problems during recovery for the procedure that I have provided informed consent. Date: 07/29/2016; Time: 7:49 AM  Pre-Procedure Preparation:  Monitoring: As per clinic protocol. Respiration, ETCO2, SpO2, BP, heart rate and rhythm monitor placed and checked for adequate function Safety Precautions: Patient was assessed for positional comfort and pressure points before starting the procedure. Time-out: I initiated and conducted the  "Time-out" before starting the procedure, as per protocol. The patient was asked to participate by confirming the accuracy of the "Time Out" information. Verification of the correct person, site, and procedure were performed and confirmed by me, the nursing staff, and the patient. "Time-out" conducted as per Joint Commission's Universal Protocol (UP.01.01.01). "Time-out" Date & Time: 07/29/2016; 0822 hrs.  Description of Procedure Process:   Position: Prone Target Area: Caudal Epidural Canal. Approach: Midline approach. Area Prepped: Entire Posterior Sacrococcygeal Region Prepping solution: ChloraPrep (2% chlorhexidine gluconate and 70% isopropyl alcohol) Safety Precautions: Aspiration looking for blood return was conducted prior to all injections. At no point did we inject any substances, as a needle was being advanced. No attempts were made at seeking any paresthesias. Safe injection practices and needle disposal techniques used. Medications properly checked for expiration dates. SDV (single dose vial) medications used. Description of the Procedure: Protocol guidelines were followed. The patient was placed in position over the fluoroscopy table. The target area was identified and the area prepped in the usual manner. Skin desensitized using vapocoolant spray. Skin & deeper tissues infiltrated with local anesthetic. Appropriate amount of time allowed to pass for local anesthetics to take effect. The procedure needles were then advanced to the target area. Proper needle placement secured. Negative aspiration confirmed. Solution injected in intermittent fashion, asking for systemic symptoms every 0.5cc of injectate. The needles were then removed and the area cleansed, making sure to leave some of the prepping solution back to take advantage of its long term bactericidal properties. Vitals:   07/29/16 0836 07/29/16 0846 07/29/16 0856 07/29/16 0906  BP: (!) 130/92 130/81 (!) 145/118 (!) 140/92  Pulse: (!)  55 (!) 59 64 (!) 54  Resp: 14 15 20 18   Temp:      SpO2: 91% 95% 93% 98%  Weight:      Height:        Start Time: 0823 hrs. End Time: 0835 hrs. Materials:  Needle(s) Type: Epidural needle Gauge: 17G Length: 3.5-in Medication(s): We administered lactated ringers, midazolam, fentaNYL, iopamidol, triamcinolone acetonide, lidocaine (PF), sodium chloride  flush, and ropivacaine (PF) 2 mg/mL (0.2%). Please see chart orders for dosing details.  Imaging Guidance (Spinal):  Type of Imaging Technique: Fluoroscopy Guidance (Spinal) Indication(s): Assistance in needle guidance and placement for procedures requiring needle placement in or near specific anatomical locations not easily accessible without such assistance. Exposure Time: Please see nurses notes. Contrast: Before injecting any contrast, we confirmed that the patient did not have an allergy to iodine, shellfish, or radiological contrast. Once satisfactory needle placement was completed at the desired level, radiological contrast was injected. Contrast injected under live fluoroscopy. No contrast complications. See chart for type and volume of contrast used. Fluoroscopic Guidance: I was personally present during the use of fluoroscopy. "Tunnel Vision Technique" used to obtain the best possible view of the target area. Parallax error corrected before commencing the procedure. "Direction-depth-direction" technique used to introduce the needle under continuous pulsed fluoroscopy. Once target was reached, antero-posterior, oblique, and lateral fluoroscopic projection used confirm needle placement in all planes. Images permanently stored in EMR. Interpretation: I personally interpreted the imaging intraoperatively. Adequate needle placement confirmed in multiple planes. Appropriate spread of contrast into desired area was observed. No evidence of afferent or efferent intravascular uptake. No intrathecal or subarachnoid spread observed. Permanent images  saved into the patient's record.  Diagnostic Epidurogram:  Contrast: Before injecting any contrast, we confirmed that the patient did not have an allergy to iodine, shellfish, or radiological contrast. For accuracy purposes, contrast was injected under live fluoroscopy. Study personally interpreted intraoparatively. Type: Non-ionic, water soluble, hypoallergenic, myelogram-compatible, radiological contrast used. Please see orders and nurses note for specific choice of contrast. Volume: Please see nurses note for injected volume.  Observations:  Spinal Alignment: Adequate       Vertebral body: Disc-osteophyte complex on the left at L5-S1 Lamina: Surgical changes observed Disc: IVD spacer bilaterally Facet: Arthropathy        Hardware: Intervertebral disc spacer  Spread: Abnormal contrast spread. See below. Anterior: Minimal anterior flow observed Posterior: Defect identified Superior (cephalad): Bilateral cut-off at surgical defect: With the right side) lower than the left Inferior (caudad): Minimal spread of contrast between the caudal canal and the S1 level bilaterally. Right lateral: ill-defined Left lateral: Contrast observed at the S1 level Plica medialis dorsalis: ill-defined Nerve root(s): S1 nerve root on the left side identified. The rest of the nerve roots are difficult to see with no contrast observed from S1 down. Epidural extravasation: This was observed around the sacrococcygeal canal Intrathecal: No intrathecal spread identified Subarachnoid: No subarachnoid spread pattern observed Vascular: No evidence of afferent or efferent intravascular uptake  Impression: Technically successful epidurogram. 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  Antibiotic Prophylaxis:  Indication(s): None identified Antibiotic given: None  Post-operative Assessment:  EBL: None Complications: No immediate post-treatment  complications observed by team, or reported by patient. Note: The patient tolerated the entire procedure well. A repeat set of vitals were taken after the procedure and the patient was kept under observation following institutional policy, for this type of procedure. Post-procedural neurological assessment was performed, showing return to baseline, prior to discharge. The patient was provided with post-procedure discharge instructions, including a section on how to identify potential problems. Should any problems arise concerning this procedure, the patient was given instructions to immediately contact us, at any time, without hesitation. In any case, we plan to contact the patient by telephone for a follow-up status report regarding this interventional procedure. Comments:  No additional relevant  information.  Plan of Care  Disposition: Discharge home  Discharge Date & Time: 07/29/2016; 0906 hrs.  Physician-requested Follow-up:  Return in about 2 weeks (around 08/12/2016) for post-procedure eval (in 2 wks), by MD.  Future Appointments Date Time Provider [place]  08/13/2016 1:30 PM Jerel Shepherd, LCSW [facility] None  08/22/2016 8:00 AM Milinda Pointer, MD [facility] None  09/02/2016 3:00 PM Daron Offer, Richard Miu, MD [facility] None  09/16/2016 10:00 AM Vevelyn Francois, NP [facility] None   Medications ordered for procedure: Meds ordered this encounter  Medications  . lactated ringers infusion 1,000 mL  . midazolam (VERSED) 5 MG/5ML injection 1-2 mg    Make sure Flumazenil is available in the pyxis when using this medication. If oversedation occurs, administer 0.2 mg IV over 15 sec. If after 45 sec no response, administer 0.2 mg again over 1 min; may repeat at 1 min intervals; not to exceed 4 doses (1 mg)  . fentaNYL (SUBLIMAZE) injection 25-50 mcg    Make sure Narcan is available in the pyxis when using this medication. In the event of respiratory depression (RR< 8/min): Titrate NARCAN  (naloxone) in increments of 0.1 to 0.2 mg IV at 2-3 minute intervals, until desired degree of reversal.  . iopamidol (ISOVUE-M) 41 % intrathecal injection 10 mL  . triamcinolone acetonide (KENALOG-40) injection 40 mg  . lidocaine (PF) (XYLOCAINE) 1 % injection 10 mL  . sodium chloride flush (NS) 0.9 % injection 2 mL  . ropivacaine (PF) 2 mg/mL (0.2%) (NAROPIN) injection 2 mL   Medications administered: We administered lactated ringers, midazolam, fentaNYL, iopamidol, triamcinolone acetonide, lidocaine (PF), sodium chloride flush, and ropivacaine (PF) 2 mg/mL (0.2%).  See the medical record for exact dosing, route, and time of administration.  Lab-work, Procedure(s), & Referral(s) Ordered: Orders Placed This Encounter  Procedures  . Caudal Epidural Injection  . DG C-Arm 1-60 Min-No Report  . Informed Consent Details: Transcribe to consent form and obtain patient signature  . Provider attestation of informed consent for procedure/surgical case  . Verify informed consent  . Discharge instructions  . Follow-up   Imaging Ordered: Results for orders placed in visit on 06/26/16  DG C-Arm 1-60 Min-No Report   Narrative Fluoroscopy was utilized by the requesting physician.  No radiographic  interpretation.    New Prescriptions   No medications on file   Primary Care Physician: Boykin Nearing, MD Location: [facility] Note by: Kathlen Brunswick. Dossie Arbour, M.D, DABA, DABAPM, DABPM, DABIPP, FIPP Date: 07/29/2016; Time: 10:24 AM  Disclaimer:  Medicine is not an exact science. The only guarantee in medicine is that nothing is guaranteed. It is important to note that the decision to proceed with this intervention was based on the information collected from the patient. The Data and conclusions were drawn from the patient's questionnaire, the interview, and the physical examination. Because the information was provided in large part by the patient, it cannot be guaranteed  that it has not been purposely or unconsciously manipulated. Every effort has been made to obtain as much relevant data as possible for this evaluation. It is important to note that the conclusions that lead to this procedure are derived in large part from the available data. Always take into account that the treatment will also be dependent on availability of resources and existing treatment guidelines, considered by other Pain Management Practitioners as being common knowledge and practice, at the time of the intervention. For Medico-Legal purposes, it is also important to point out  that variation in procedural techniques and pharmacological choices are the acceptable norm. The indications, contraindications, technique, and results of the above procedure should only be interpreted and judged by a Board-Certified Interventional Pain Specialist with extensive familiarity and expertise in the same exact procedure and technique.  Instructions provided at this appointment: Patient Instructions   Post-Procedure instructions Instructions:  Apply ice: Fill a plastic sandwich bag with crushed ice. Cover it with a small towel and apply to injection site. Apply for 15 minutes then remove x 15 minutes. Repeat sequence on day of procedure, until you go to bed. The purpose is to minimize swelling and discomfort after procedure.  Apply heat: Apply heat to procedure site starting the day following the procedure. The purpose is to treat any soreness and discomfort from the procedure.  Food intake: Start with clear liquids (like water) and advance to regular food, as tolerated.   Physical activities: Keep activities to a minimum for the first 8 hours after the procedure.   Driving: If you have received any sedation, you are not allowed to drive for 24 hours after your procedure.  Blood thinner: Restart your blood thinner 6 hours after your procedure. (Only for those taking blood thinners)  Insulin: As soon as  you can eat, you may resume your normal dosing schedule. (Only for those taking insulin)  Infection prevention: Keep procedure site clean and dry.  Post-procedure Pain Diary: Extremely important that this be done correctly and accurately. Recorded information will be used to determine the next step in treatment.  Pain evaluated is that of treated area only. Do not include pain from an untreated area.  Complete every hour, on the hour, for the initial 8 hours. Set an alarm to help you do this part accurately.  Do not go to sleep and have it completed later. It will not be accurate.  Follow-up appointment: Keep your follow-up appointment after the procedure. Usually 2 weeks for most procedures. (6 weeks in the case of radiofrequency.) Bring you pain diary.  Expect:  From numbing medicine (AKA: Local Anesthetics): Numbness or decrease in pain.  Onset: Full effect within 15 minutes of injected.  Duration: It will depend on the type of local anesthetic used. On the average, 1 to 8 hours.   From steroids: Decrease in swelling or inflammation. Once inflammation is improved, relief of the pain will follow.  Onset of benefits: Depends on the amount of swelling present. The more swelling, the longer it will take for the benefits to be seen.   Duration: Steroids will stay in the system x 2 weeks. Duration of benefits will depend on multiple posibilities including persistent irritating factors.  From procedure: Some discomfort is to be expected once the numbing medicine wears off. This should be minimal if ice and heat are applied as instructed. Call if:  You experience numbness and weakness that gets worse with time, as opposed to wearing off.  New onset bowel or bladder incontinence. (Spinal procedures only)  Emergency Numbers:  Durning business hours (Monday - Thursday, 8:00 AM - 4:00 PM) (Friday, 9:00 AM - 12:00 Noon): [phone]  After hours: [phone]  [phone] _____________________________________________________________________________________________   Steps to Quit Smoking Smoking tobacco can be bad for your health. It can also affect almost every organ in your body. Smoking puts you and people around you at risk for many serious long-lasting (chronic) diseases. Quitting smoking is hard, but it is one of the best things that you can do for your health. It  is never too late to quit. What are the benefits of quitting smoking? When you quit smoking, you lower your risk for getting serious diseases and conditions. They can include:  Lung cancer or lung disease.  Heart disease.  Stroke.  Heart attack.  Not being able to have children (infertility).  Weak bones (osteoporosis) and broken bones (fractures). If you have coughing, wheezing, and shortness of breath, those symptoms may get better when you quit. You may also get sick less often. If you are pregnant, quitting smoking can help to lower your chances of having a baby of low birth weight. What can I do to help me quit smoking? Talk with your doctor about what can help you quit smoking. Some things you can do (strategies) include:  Quitting smoking totally, instead of slowly cutting back how much you smoke over a period of time.  Going to in-person counseling. You are more likely to quit if you go to many counseling sessions.  Using resources and support systems, such as:  Online chats with a Social worker.  Phone quitlines.  Printed Furniture conservator/restorer.  Support groups or group counseling.  Text messaging programs.  Mobile phone apps or applications.  Taking medicines. Some of these medicines may have nicotine in them. If you are pregnant or breastfeeding, do not take any medicines to quit smoking unless your doctor says it is okay. Talk with your doctor about counseling or other things that can help you. Talk with your doctor about using more than one strategy at the same  time, such as taking medicines while you are also going to in-person counseling. This can help make quitting easier. What things can I do to make it easier to quit? Quitting smoking might feel very hard at first, but there is a lot that you can do to make it easier. Take these steps:  Talk to your family and friends. Ask them to support and encourage you.  Call phone quitlines, reach out to support groups, or work with a Social worker.  Ask people who smoke to not smoke around you.  Avoid places that make you want (trigger) to smoke, such as:  Bars.  Parties.  Smoke-break areas at work.  Spend time with people who do not smoke.  Lower the stress in your life. Stress can make you want to smoke. Try these things to help your stress:  Getting regular exercise.  Deep-breathing exercises.  Yoga.  Meditating.  Doing a body scan. To do this, close your eyes, focus on one area of your body at a time from head to toe, and notice which parts of your body are tense. Try to relax the muscles in those areas.  Download or buy apps on your mobile phone or tablet that can help you stick to your quit plan. There are many free apps, such as QuitGuide from the [employer] Office manager for Disease Control and Prevention). You can find more support from smokefree.gov and other websites. This information is not intended to replace advice given to you by your health care provider. Make sure you discuss any questions you have with your health care provider. Document Released: 12/22/2008 Document Revised: 10/24/2015 Document Reviewed: 07/12/2014 Elsevier Interactive Patient Education  2017 [employer]. Pain Management Discharge Instructions  General Discharge Instructions :  If you need to reach your doctor call: Monday-Friday 8:00 am - 4:00 pm at [phone] or toll free [phone].  After clinic hours [phone] to have operator reach doctor.  Bring all of your medication bottles  to all your appointments  in the pain clinic.  To cancel or reschedule your appointment with Pain Management please remember to call 24 hours in advance to avoid a fee.  Refer to the educational materials which you have been given on: General Risks, I had my Procedure. Discharge Instructions, Post Sedation.  Post Procedure Instructions:  The drugs you were given will stay in your system until tomorrow, so for the next 24 hours you should not drive, make any legal decisions or drink any alcoholic beverages.  You may eat anything you prefer, but it is better to start with liquids then soups and crackers, and gradually work up to solid foods.  Please notify your doctor immediately if you have any unusual bleeding, trouble breathing or pain that is not related to your normal pain.  Depending on the type of procedure that was done, some parts of your body may feel week and/or numb.  This usually clears up by tonight or the next day.  Walk with the use of an assistive device or accompanied by an adult for the 24 hours.  You may use ice on the affected area for the first 24 hours.  Put ice in a Ziploc bag and cover with a towel and place against area 15 minutes on 15 minutes off.  You may switch to heat after 24 hours.GENERAL RISKS AND COMPLICATIONS  What are the risk, side effects and possible complications? Generally speaking, most procedures are safe.  However, with any procedure there are risks, side effects, and the possibility of complications.  The risks and complications are dependent upon the sites that are lesioned, or the type of nerve block to be performed.  The closer the procedure is to the spine, the more serious the risks are.  Great care is taken when placing the radio frequency needles, block needles or lesioning probes, but sometimes complications can occur. 1. Infection: Any time there is an injection through the skin, there is a risk of infection.  This is why sterile conditions are used for these blocks.   There are four possible types of infection. 1. Localized skin infection. 2. Central Nervous System Infection-This can be in the form of Meningitis, which can be deadly. 3. Epidural Infections-This can be in the form of an epidural abscess, which can cause pressure inside of the spine, causing compression of the spinal cord with subsequent paralysis. This would require an emergency surgery to decompress, and there are no guarantees that the patient would recover from the paralysis. 4. Discitis-This is an infection of the intervertebral discs.  It occurs in about 1% of discography procedures.  It is difficult to treat and it may lead to surgery.        2. Pain: the needles have to go through skin and soft tissues, will cause soreness.       3. Damage to internal structures:  The nerves to be lesioned may be near blood vessels or    other nerves which can be potentially damaged.       4. Bleeding: Bleeding is more common if the patient is taking blood thinners such as  aspirin, Coumadin, Ticiid, Plavix, etc., or if he/she have some genetic predisposition  such as hemophilia. Bleeding into the spinal canal can cause compression of the spinal  cord with subsequent paralysis.  This would require an emergency surgery to  decompress and there are no guarantees that the patient would recover from the  paralysis.  5. Pneumothorax:  Puncturing of a lung is a possibility, every time a needle is introduced in  the area of the chest or upper back.  Pneumothorax refers to free air around the  collapsed lung(s), inside of the thoracic cavity (chest cavity).  Another two possible  complications related to a similar event would include: Hemothorax and Chylothorax.   These are variations of the Pneumothorax, where instead of air around the collapsed  lung(s), you may have blood or chyle, respectively.       6. Spinal headaches: They may occur with any procedures in the area of the spine.       7. Persistent CSF  (Cerebro-Spinal Fluid) leakage: This is a rare problem, but may occur  with prolonged intrathecal or epidural catheters either due to the formation of a fistulous  track or a dural tear.       8. Nerve damage: By working so close to the spinal cord, there is always a possibility of  nerve damage, which could be as serious as a permanent spinal cord injury with  paralysis.       9. Death:  Although rare, severe deadly allergic reactions known as "Anaphylactic  reaction" can occur to any of the medications used.      10. Worsening of the symptoms:  We can always make thing worse.  What are the chances of something like this happening? Chances of any of this occuring are extremely low.  By statistics, you have more of a chance of getting killed in a motor vehicle accident: while driving to the hospital than any of the above occurring .  Nevertheless, you should be aware that they are possibilities.  In general, it is similar to taking a shower.  Everybody knows that you can slip, hit your head and get killed.  Does that mean that you should not shower again?  Nevertheless always keep in mind that statistics do not mean anything if you happen to be on the wrong side of them.  Even if a procedure has a 1 (one) in a 1,000,000 (million) chance of going wrong, it you happen to be that one..Also, keep in mind that by statistics, you have more of a chance of having something go wrong when taking medications.  Who should not have this procedure? If you are on a blood thinning medication (e.g. Coumadin, Plavix, see list of "Blood Thinners"), or if you have an active infection going on, you should not have the procedure.  If you are taking any blood thinners, please inform your physician.  How should I prepare for this procedure?  Do not eat or drink anything at least six hours prior to the procedure.  Bring a driver with you .  It cannot be a taxi.  Come accompanied by an adult that can drive you back, and that  is strong enough to help you if your legs get weak or numb from the local anesthetic.  Take all of your medicines the morning of the procedure with just enough water to swallow them.  If you have diabetes, make sure that you are scheduled to have your procedure done first thing in the morning, whenever possible.  If you have diabetes, take only half of your insulin dose and notify our nurse that you have done so as soon as you arrive at the clinic.  If you are diabetic, but only take blood sugar pills (oral hypoglycemic), then do not take them on the morning of your  procedure.  You may take them after you have had the procedure.  Do not take aspirin or any aspirin-containing medications, at least eleven (11) days prior to the procedure.  They may prolong bleeding.  Wear loose fitting clothing that may be easy to take off and that you would not mind if it got stained with Betadine or blood.  Do not wear any jewelry or perfume  Remove any nail coloring.  It will interfere with some of our monitoring equipment.  NOTE: Remember that this is not meant to be interpreted as a complete list of all possible complications.  Unforeseen problems may occur.  BLOOD THINNERS The following drugs contain aspirin or other products, which can cause increased bleeding during surgery and should not be taken for 2 weeks prior to and 1 week after surgery.  If you should need take something for relief of minor pain, you may take acetaminophen which is found in Tylenol,m Datril, Anacin-3 and Panadol. It is not blood thinner. The products listed below are.  Do not take any of the products listed below in addition to any listed on your instruction sheet.  A.P.C or A.P.C with Codeine Codeine Phosphate Capsules #3 Ibuprofen Ridaura  ABC compound Congesprin Imuran rimadil  Advil Cope Indocin Robaxisal  Alka-Seltzer Effervescent Pain Reliever and Antacid Coricidin or Coricidin-D  Indomethacin Rufen  Alka-Seltzer plus  Cold Medicine Cosprin Ketoprofen S-A-C Tablets  Anacin Analgesic Tablets or Capsules Coumadin Korlgesic Salflex  Anacin Extra Strength Analgesic tablets or capsules CP-2 Tablets Lanoril Salicylate  Anaprox Cuprimine Capsules Levenox Salocol  Anexsia-D Dalteparin Magan Salsalate  Anodynos Darvon compound Magnesium Salicylate Sine-off  Ansaid Dasin Capsules Magsal Sodium Salicylate  Anturane Depen Capsules Marnal Soma  APF Arthritis pain formula Dewitt's Pills Measurin Stanback  Argesic Dia-Gesic Meclofenamic Sulfinpyrazone  Arthritis Bayer Timed Release Aspirin Diclofenac Meclomen Sulindac  Arthritis pain formula Anacin Dicumarol Medipren Supac  Analgesic (Safety coated) Arthralgen Diffunasal Mefanamic Suprofen  Arthritis Strength Bufferin Dihydrocodeine Mepro Compound Suprol  Arthropan liquid Dopirydamole Methcarbomol with Aspirin Synalgos  ASA tablets/Enseals Disalcid Micrainin Tagament  Ascriptin Doan's Midol Talwin  Ascriptin A/D Dolene Mobidin Tanderil  Ascriptin Extra Strength Dolobid Moblgesic Ticlid  Ascriptin with Codeine Doloprin or Doloprin with Codeine Momentum Tolectin  Asperbuf Duoprin Mono-gesic Trendar  Aspergum Duradyne Motrin or Motrin IB Triminicin  Aspirin plain, buffered or enteric coated Durasal Myochrisine Trigesic  Aspirin Suppositories Easprin Nalfon Trillsate  Aspirin with Codeine Ecotrin Regular or Extra Strength Naprosyn Uracel  Atromid-S Efficin Naproxen Ursinus  Auranofin Capsules Elmiron Neocylate Vanquish  Axotal Emagrin Norgesic Verin  Azathioprine Empirin or Empirin with Codeine Normiflo Vitamin E  Azolid Emprazil Nuprin Voltaren  Bayer Aspirin plain, buffered or children's or timed BC Tablets or powders Encaprin Orgaran Warfarin Sodium  Buff-a-Comp Enoxaparin Orudis Zorpin  Buff-a-Comp with Codeine Equegesic Os-Cal-Gesic   Buffaprin Excedrin plain, buffered or Extra Strength Oxalid   Bufferin Arthritis Strength Feldene Oxphenbutazone   Bufferin  plain or Extra Strength Feldene Capsules Oxycodone with Aspirin   Bufferin with Codeine Fenoprofen Fenoprofen Pabalate or Pabalate-SF   Buffets II Flogesic Panagesic   Buffinol plain or Extra Strength Florinal or Florinal with Codeine Panwarfarin   Buf-Tabs Flurbiprofen Penicillamine   Butalbital Compound Four-way cold tablets Penicillin   Butazolidin Fragmin Pepto-Bismol   Carbenicillin Geminisyn Percodan   Carna Arthritis Reliever Geopen Persantine   Carprofen Gold's salt Persistin   Chloramphenicol Goody's Phenylbutazone   Chloromycetin Haltrain Piroxlcam   Clmetidine heparin Plaquenil   Cllnoril Hyco-pap Ponstel   Clofibrate  Hydroxy chloroquine Propoxyphen         Before stopping any of these medications, be sure to consult the physician who ordered them.  Some, such as Coumadin (Warfarin) are ordered to prevent or treat serious conditions such as "deep thrombosis", "pumonary embolisms", and other heart problems.  The amount of time that you may need off of the medication may also vary with the medication and the reason for which you were taking it.  If you are taking any of these medications, please make sure you notify your pain physician before you undergo any procedures.          Epidural Steroid Injection An epidural steroid injection is a shot of steroid medicine and numbing medicine that is given into the space between the spinal cord and the bones in your back (epidural space). The shot helps relieve pain caused by an irritated or swollen nerve root. The amount of pain relief you get from the injection depends on what is causing the nerve to be swollen and irritated, and how long your pain lasts. You are more likely to benefit from this injection if your pain is strong and comes on suddenly rather than if you have had pain for a long time. Tell a health care provider about:  Any allergies you have.  All medicines you are taking, including vitamins, herbs, eye drops,  creams, and over-the-counter medicines.  Any problems you or family members have had with anesthetic medicines.  Any blood disorders you have.  Any surgeries you have had.  Any medical conditions you have.  Whether you are pregnant or may be pregnant. What are the risks? Generally, this is a safe procedure. However, problems may occur, including:  Headache.  Bleeding.  Infection.  Allergic reaction to medicines.  Damage to your nerves. What happens before the procedure? Staying hydrated  Follow instructions from your health care provider about hydration, which may include:  Up to 2 hours before the procedure - you may continue to drink clear liquids, such as water, clear fruit juice, black coffee, and plain tea. Eating and drinking restrictions  Follow instructions from your health care provider about eating and drinking, which may include:  8 hours before the procedure - stop eating heavy meals or foods such as meat, fried foods, or fatty foods.  6 hours before the procedure - stop eating light meals or foods, such as toast or cereal.  6 hours before the procedure - stop drinking milk or drinks that contain milk.  2 hours before the procedure - stop drinking clear liquids. Medicine   You may be given medicines to lower anxiety.  Ask your health care provider about:  Changing or stopping your regular medicines. This is especially important if you are taking diabetes medicines or blood thinners.  Taking medicines such as aspirin and ibuprofen. These medicines can thin your blood. Do not take these medicines before your procedure if your health care provider instructs you not to. General instructions   Plan to have someone take you home from the hospital or clinic. What happens during the procedure?  You may receive a medicine to help you relax (sedative).  You will be asked to lie on your abdomen.  The injection site will be cleaned.  A numbing medicine (local  anesthetic) will be used to numb the injection site.  A needle will be inserted through your skin into the epidural space. You may feel some discomfort when this happens. An X-ray machine will be  used to make sure the needle is put as close as possible to the affected nerve.  A steroid medicine and a local anesthetic will be injected into the epidural space.  The needle will be removed.  A bandage (dressing) will be put over the injection site. What happens after the procedure?  Your blood pressure, heart rate, breathing rate, and blood oxygen level will be monitored until the medicines you were given have worn off.  Your arm or leg may feel weak or numb for a few hours.  The injection site may feel sore.  Do not drive for 24 hours if you received a sedative. This information is not intended to replace advice given to you by your health care provider. Make sure you discuss any questions you have with your health care provider. Document Released: 06/04/2007 Document Revised: 08/09/2015 Document Reviewed: 06/13/2015 Elsevier Interactive Patient Education  2017 [employer].

## 2016-07-30 ENCOUNTER — Telehealth: Payer: Self-pay

## 2016-07-30 NOTE — Telephone Encounter (Signed)
Post procedure phone call.  Patients step father states that he is doing good so far.  Instructed to call for any questions or concerns.

## 2016-08-06 MED FILL — METOPROLOL TARTRATE 50 MG T: 50 | 30 days supply | Qty: 90 | Fill #2

## 2016-08-06 MED FILL — GABAPENTIN 800 MG TABLET: 800 | 30 days supply | Qty: 120 | Fill #1

## 2016-08-13 ENCOUNTER — Other Ambulatory Visit: Payer: Self-pay | Admitting: Pain Medicine

## 2016-08-13 ENCOUNTER — Ambulatory Visit (HOSPITAL_COMMUNITY): Payer: Self-pay | Admitting: Clinical

## 2016-08-13 DIAGNOSIS — G894 Chronic pain syndrome: Secondary | ICD-10-CM

## 2016-08-13 DIAGNOSIS — M7918 Myalgia, other site: Secondary | ICD-10-CM

## 2016-08-13 MED FILL — traMADol HCL 50 MG TABS: 50 | 30 days supply | Qty: 240 | Fill #2

## 2016-08-13 MED FILL — ?CYCLOBENZAPRINE 10 MG TABL: 10 | 30 days supply | Qty: 30 | Fill #2

## 2016-08-22 ENCOUNTER — Encounter: Payer: Self-pay | Admitting: Pain Medicine

## 2016-08-22 ENCOUNTER — Ambulatory Visit: Payer: Self-pay | Attending: Pain Medicine | Admitting: Pain Medicine

## 2016-08-22 VITALS — BP 130/87 | HR 68 | Temp 97.6°F | Resp 18 | Ht 76.0 in | Wt 225.0 lb

## 2016-08-22 DIAGNOSIS — Z981 Arthrodesis status: Secondary | ICD-10-CM | POA: Insufficient documentation

## 2016-08-22 DIAGNOSIS — E785 Hyperlipidemia, unspecified: Secondary | ICD-10-CM | POA: Insufficient documentation

## 2016-08-22 DIAGNOSIS — K219 Gastro-esophageal reflux disease without esophagitis: Secondary | ICD-10-CM | POA: Insufficient documentation

## 2016-08-22 DIAGNOSIS — M47816 Spondylosis without myelopathy or radiculopathy, lumbar region: Secondary | ICD-10-CM | POA: Insufficient documentation

## 2016-08-22 DIAGNOSIS — M79604 Pain in right leg: Secondary | ICD-10-CM | POA: Insufficient documentation

## 2016-08-22 DIAGNOSIS — M5441 Lumbago with sciatica, right side: Secondary | ICD-10-CM

## 2016-08-22 DIAGNOSIS — G894 Chronic pain syndrome: Secondary | ICD-10-CM | POA: Insufficient documentation

## 2016-08-22 DIAGNOSIS — F315 Bipolar disorder, current episode depressed, severe, with psychotic features: Secondary | ICD-10-CM | POA: Insufficient documentation

## 2016-08-22 DIAGNOSIS — M79605 Pain in left leg: Secondary | ICD-10-CM | POA: Insufficient documentation

## 2016-08-22 DIAGNOSIS — M533 Sacrococcygeal disorders, not elsewhere classified: Secondary | ICD-10-CM | POA: Insufficient documentation

## 2016-08-22 DIAGNOSIS — N529 Male erectile dysfunction, unspecified: Secondary | ICD-10-CM | POA: Insufficient documentation

## 2016-08-22 DIAGNOSIS — Z79891 Long term (current) use of opiate analgesic: Secondary | ICD-10-CM | POA: Insufficient documentation

## 2016-08-22 DIAGNOSIS — M545 Low back pain: Secondary | ICD-10-CM | POA: Insufficient documentation

## 2016-08-22 DIAGNOSIS — E559 Vitamin D deficiency, unspecified: Secondary | ICD-10-CM | POA: Insufficient documentation

## 2016-08-22 DIAGNOSIS — F10229 Alcohol dependence with intoxication, unspecified: Secondary | ICD-10-CM | POA: Insufficient documentation

## 2016-08-22 DIAGNOSIS — M5442 Lumbago with sciatica, left side: Secondary | ICD-10-CM

## 2016-08-22 DIAGNOSIS — M4696 Unspecified inflammatory spondylopathy, lumbar region: Secondary | ICD-10-CM

## 2016-08-22 DIAGNOSIS — G8929 Other chronic pain: Secondary | ICD-10-CM

## 2016-08-22 DIAGNOSIS — R51 Headache: Secondary | ICD-10-CM | POA: Insufficient documentation

## 2016-08-22 DIAGNOSIS — F411 Generalized anxiety disorder: Secondary | ICD-10-CM | POA: Insufficient documentation

## 2016-08-22 DIAGNOSIS — F17210 Nicotine dependence, cigarettes, uncomplicated: Secondary | ICD-10-CM | POA: Insufficient documentation

## 2016-08-22 DIAGNOSIS — I10 Essential (primary) hypertension: Secondary | ICD-10-CM | POA: Insufficient documentation

## 2016-08-22 DIAGNOSIS — Z79899 Other long term (current) drug therapy: Secondary | ICD-10-CM | POA: Insufficient documentation

## 2016-08-22 HISTORY — DX: Other chronic pain: G89.29

## 2016-08-22 NOTE — Patient Instructions (Addendum)
____________________________________________________________________________________________  Preparing for Procedure with Sedation Instructions: . Oral Intake: Do not eat or drink anything for at least 8 hours prior to your procedure. . Transportation: Public transportation is not allowed. Bring an adult driver. The driver must be physically present in our waiting room before any procedure can be started. . Physical Assistance: Bring an adult physically capable of assisting you, in the event you need help. This adult should keep you company at home for at least 6 hours after the procedure. . Blood Pressure Medicine: Take your blood pressure medicine with a sip of water the morning of the procedure. . Blood thinners:  . Diabetics on insulin: Notify the staff so that you can be scheduled 1st case in the morning. If your diabetes requires high dose insulin, take only  of your normal insulin dose the morning of the procedure and notify the staff that you have done so. . Preventing infections: Shower with an antibacterial soap the morning of your procedure. . Build-up your immune system: Take 1000 mg of Vitamin C with every meal (3 times a day) the day prior to your procedure. . Antibiotics: Inform the staff if you have a condition or reason that requires you to take antibiotics before dental procedures. . Pregnancy: If you are pregnant, call and cancel the procedure. . Sickness: If you have a cold, fever, or any active infections, call and cancel the procedure. . Arrival: You must be in the facility at least 30 minutes prior to your scheduled procedure. . Children: Do not bring children with you. . Dress appropriately: Bring dark clothing that you would not mind if they get stained. . Valuables: Do not bring any jewelry or valuables. Procedure appointments are reserved for interventional treatments only. . No Prescription Refills. . No medication changes will be discussed during procedure  appointments. . No disability issues will be discussed. ____________________________________________________________________________________________  GENERAL RISKS AND COMPLICATIONS  What are the risk, side effects and possible complications? Generally speaking, most procedures are safe.  However, with any procedure there are risks, side effects, and the possibility of complications.  The risks and complications are dependent upon the sites that are lesioned, or the type of nerve block to be performed.  The closer the procedure is to the spine, the more serious the risks are.  Great care is taken when placing the radio frequency needles, block needles or lesioning probes, but sometimes complications can occur. 1. Infection: Any time there is an injection through the skin, there is a risk of infection.  This is why sterile conditions are used for these blocks.  There are four possible types of infection. 1. Localized skin infection. 2. Central Nervous System Infection-This can be in the form of Meningitis, which can be deadly. 3. Epidural Infections-This can be in the form of an epidural abscess, which can cause pressure inside of the spine, causing compression of the spinal cord with subsequent paralysis. This would require an emergency surgery to decompress, and there are no guarantees that the patient would recover from the paralysis. 4. Discitis-This is an infection of the intervertebral discs.  It occurs in about 1% of discography procedures.  It is difficult to treat and it may lead to surgery.        2. Pain: the needles have to go through skin and soft tissues, will cause soreness.       3. Damage to internal structures:  The nerves to be lesioned may be near blood vessels or      other nerves which can be potentially damaged.       4. Bleeding: Bleeding is more common if the patient is taking blood thinners such as  aspirin, Coumadin, Ticiid, Plavix, etc., or if he/she have some genetic  predisposition  such as hemophilia. Bleeding into the spinal canal can cause compression of the spinal  cord with subsequent paralysis.  This would require an emergency surgery to  decompress and there are no guarantees that the patient would recover from the  paralysis.       5. Pneumothorax:  Puncturing of a lung is a possibility, every time a needle is introduced in  the area of the chest or upper back.  Pneumothorax refers to free air around the  collapsed lung(s), inside of the thoracic cavity (chest cavity).  Another two possible  complications related to a similar event would include: Hemothorax and Chylothorax.   These are variations of the Pneumothorax, where instead of air around the collapsed  lung(s), you may have blood or chyle, respectively.       6. Spinal headaches: They may occur with any procedures in the area of the spine.       7. Persistent CSF (Cerebro-Spinal Fluid) leakage: This is a rare problem, but may occur  with prolonged intrathecal or epidural catheters either due to the formation of a fistulous  track or a dural tear.       8. Nerve damage: By working so close to the spinal cord, there is always a possibility of  nerve damage, which could be as serious as a permanent spinal cord injury with  paralysis.       9. Death:  Although rare, severe deadly allergic reactions known as "Anaphylactic  reaction" can occur to any of the medications used.      10. Worsening of the symptoms:  We can always make thing worse.  What are the chances of something like this happening? Chances of any of this occuring are extremely low.  By statistics, you have more of a chance of getting killed in a motor vehicle accident: while driving to the hospital than any of the above occurring .  Nevertheless, you should be aware that they are possibilities.  In general, it is similar to taking a shower.  Everybody knows that you can slip, hit your head and get killed.  Does that mean that you should not  shower again?  Nevertheless always keep in mind that statistics do not mean anything if you happen to be on the wrong side of them.  Even if a procedure has a 1 (one) in a 1,000,000 (million) chance of going wrong, it you happen to be that one..Also, keep in mind that by statistics, you have more of a chance of having something go wrong when taking medications.  Who should not have this procedure? If you are on a blood thinning medication (e.g. Coumadin, Plavix, see list of "Blood Thinners"), or if you have an active infection going on, you should not have the procedure.  If you are taking any blood thinners, please inform your physician.  How should I prepare for this procedure?  Do not eat or drink anything at least six hours prior to the procedure.  Bring a driver with you .  It cannot be a taxi.  Come accompanied by an adult that can drive you back, and that is strong enough to help you if your legs get weak or numb from the local anesthetic.  Take   all of your medicines the morning of the procedure with just enough water to swallow them.  If you have diabetes, make sure that you are scheduled to have your procedure done first thing in the morning, whenever possible.  If you have diabetes, take only half of your insulin dose and notify our nurse that you have done so as soon as you arrive at the clinic.  If you are diabetic, but only take blood sugar pills (oral hypoglycemic), then do not take them on the morning of your procedure.  You may take them after you have had the procedure.  Do not take aspirin or any aspirin-containing medications, at least eleven (11) days prior to the procedure.  They may prolong bleeding.  Wear loose fitting clothing that may be easy to take off and that you would not mind if it got stained with Betadine or blood.  Do not wear any jewelry or perfume  Remove any nail coloring.  It will interfere with some of our monitoring equipment.  NOTE: Remember that  this is not meant to be interpreted as a complete list of all possible complications.  Unforeseen problems may occur.  BLOOD THINNERS The following drugs contain aspirin or other products, which can cause increased bleeding during surgery and should not be taken for 2 weeks prior to and 1 week after surgery.  If you should need take something for relief of minor pain, you may take acetaminophen which is found in Tylenol,m Datril, Anacin-3 and Panadol. It is not blood thinner. The products listed below are.  Do not take any of the products listed below in addition to any listed on your instruction sheet.  A.P.C or A.P.C with Codeine Codeine Phosphate Capsules #3 Ibuprofen Ridaura  ABC compound Congesprin Imuran rimadil  Advil Cope Indocin Robaxisal  Alka-Seltzer Effervescent Pain Reliever and Antacid Coricidin or Coricidin-D  Indomethacin Rufen  Alka-Seltzer plus Cold Medicine Cosprin Ketoprofen S-A-C Tablets  Anacin Analgesic Tablets or Capsules Coumadin Korlgesic Salflex  Anacin Extra Strength Analgesic tablets or capsules CP-2 Tablets Lanoril Salicylate  Anaprox Cuprimine Capsules Levenox Salocol  Anexsia-D Dalteparin Magan Salsalate  Anodynos Darvon compound Magnesium Salicylate Sine-off  Ansaid Dasin Capsules Magsal Sodium Salicylate  Anturane Depen Capsules Marnal Soma  APF Arthritis pain formula Dewitt's Pills Measurin Stanback  Argesic Dia-Gesic Meclofenamic Sulfinpyrazone  Arthritis Bayer Timed Release Aspirin Diclofenac Meclomen Sulindac  Arthritis pain formula Anacin Dicumarol Medipren Supac  Analgesic (Safety coated) Arthralgen Diffunasal Mefanamic Suprofen  Arthritis Strength Bufferin Dihydrocodeine Mepro Compound Suprol  Arthropan liquid Dopirydamole Methcarbomol with Aspirin Synalgos  ASA tablets/Enseals Disalcid Micrainin Tagament  Ascriptin Doan's Midol Talwin  Ascriptin A/D Dolene Mobidin Tanderil  Ascriptin Extra Strength Dolobid Moblgesic Ticlid  Ascriptin with Codeine  Doloprin or Doloprin with Codeine Momentum Tolectin  Asperbuf Duoprin Mono-gesic Trendar  Aspergum Duradyne Motrin or Motrin IB Triminicin  Aspirin plain, buffered or enteric coated Durasal Myochrisine Trigesic  Aspirin Suppositories Easprin Nalfon Trillsate  Aspirin with Codeine Ecotrin Regular or Extra Strength Naprosyn Uracel  Atromid-S Efficin Naproxen Ursinus  Auranofin Capsules Elmiron Neocylate Vanquish  Axotal Emagrin Norgesic Verin  Azathioprine Empirin or Empirin with Codeine Normiflo Vitamin E  Azolid Emprazil Nuprin Voltaren  Bayer Aspirin plain, buffered or children's or timed BC Tablets or powders Encaprin Orgaran Warfarin Sodium  Buff-a-Comp Enoxaparin Orudis Zorpin  Buff-a-Comp with Codeine Equegesic Os-Cal-Gesic   Buffaprin Excedrin plain, buffered or Extra Strength Oxalid   Bufferin Arthritis Strength Feldene Oxphenbutazone   Bufferin plain or Extra Strength Feldene Capsules   Oxycodone with Aspirin   Bufferin with Codeine Fenoprofen Fenoprofen Pabalate or Pabalate-SF   Buffets II Flogesic Panagesic   Buffinol plain or Extra Strength Florinal or Florinal with Codeine Panwarfarin   Buf-Tabs Flurbiprofen Penicillamine   Butalbital Compound Four-way cold tablets Penicillin   Butazolidin Fragmin Pepto-Bismol   Carbenicillin Geminisyn Percodan   Carna Arthritis Reliever Geopen Persantine   Carprofen Gold's salt Persistin   Chloramphenicol Goody's Phenylbutazone   Chloromycetin Haltrain Piroxlcam   Clmetidine heparin Plaquenil   Cllnoril Hyco-pap Ponstel   Clofibrate Hydroxy chloroquine Propoxyphen         Before stopping any of these medications, be sure to consult the physician who ordered them.  Some, such as Coumadin (Warfarin) are ordered to prevent or treat serious conditions such as "deep thrombosis", "pumonary embolisms", and other heart problems.  The amount of time that you may need off of the medication may also vary with the medication and the reason for which  you were taking it.  If you are taking any of these medications, please make sure you notify your pain physician before you undergo any procedures.         Facet Blocks Patient Information  Description: The facets are joints in the spine between the vertebrae.  Like any joints in the body, facets can become irritated and painful.  Arthritis can also effect the facets.  By injecting steroids and local anesthetic in and around these joints, we can temporarily block the nerve supply to them.  Steroids act directly on irritated nerves and tissues to reduce selling and inflammation which often leads to decreased pain.  Facet blocks may be done anywhere along the spine from the neck to the low back depending upon the location of your pain.   After numbing the skin with local anesthetic (like Novocaine), a small needle is passed onto the facet joints under x-ray guidance.  You may experience a sensation of pressure while this is being done.  The entire block usually lasts about 15-25 minutes.   Conditions which may be treated by facet blocks:   Low back/buttock pain  Neck/shoulder pain  Certain types of headaches  Preparation for the injection:  1. Do not eat any solid food or dairy products within 8 hours of your appointment. 2. You may drink clear liquid up to 3 hours before appointment.  Clear liquids include water, black coffee, juice or soda.  No milk or cream please. 3. You may take your regular medication, including pain medications, with a sip of water before your appointment.  Diabetics should hold regular insulin (if taken separately) and take 1/2 normal NPH dose the morning of the procedure.  Carry some sugar containing items with you to your appointment. 4. A driver must accompany you and be prepared to drive you home after your procedure. 5. Bring all your current medications with you. 6. An IV may be inserted and sedation may be given at the discretion of the physician. 7. A  blood pressure cuff, EKG and other monitors will often be applied during the procedure.  Some patients may need to have extra oxygen administered for a short period. 8. You will be asked to provide medical information, including your allergies and medications, prior to the procedure.  We must know immediately if you are taking blood thinners (like Coumadin/Warfarin) or if you are allergic to IV iodine contrast (dye).  We must know if you could possible be pregnant.  Possible side-effects:   Bleeding from needle site    Infection (rare, may require surgery)  Nerve injury (rare)  Numbness & tingling (temporary)  Difficulty urinating (rare, temporary)  Spinal headache (a headache worse with upright posture)  Light-headedness (temporary)  Pain at injection site (serveral days)  Decreased blood pressure (rare, temporary)  Weakness in arm/leg (temporary)  Pressure sensation in back/neck (temporary)   Call if you experience:   Fever/chills associated with headache or increased back/neck pain  Headache worsened by an upright position  New onset, weakness or numbness of an extremity below the injection site  Hives or difficulty breathing (go to the emergency room)  Inflammation or drainage at the injection site(s)  Severe back/neck pain greater than usual  New symptoms which are concerning to you  Please note:  Although the local anesthetic injected can often make your back or neck feel good for several hours after the injection, the pain will likely return. It takes 3-7 days for steroids to work.  You may not notice any pain relief for at least one week.  If effective, we will often do a series of 2-3 injections spaced 3-6 weeks apart to maximally decrease your pain.  After the initial series, you may be a candidate for a more permanent nerve block of the facets.  If you have any questions, please call #[phone] [facility]

## 2016-08-22 NOTE — Progress Notes (Signed)
Patient's Name: Mark [name]  MRN: [id number]  Referring Provider: Boykin Nearing, MD  DOB: 02-20-1966  PCP: Boykin Nearing, MD  DOS: 08/22/2016  Note by: Kathlen Brunswick. Dossie Arbour, MD  Service setting: Ambulatory outpatient  Specialty: Interventional Pain Management  Location: [facility] (AMB) Pain Management Facility    Patient type: Established   Primary Reason(s) for Visit: Encounter for post-procedure evaluation of chronic illness with mild to moderate exacerbation CC: Back Pain (low)  HPI  [name] is a 51 y.o. year old, male patient, who comes today for a post-procedure evaluation. 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 ([place]); Alcohol intoxication ([place]); Chronic low back pain (Location of Secondary source of pain) (Bilateral) (R>L); Chronic pain syndrome; Paroxysmal VT ([place]); Opioid dependence ([place]); Long Q-T syndrome; Hypokalemia; Alcohol use disorder, severe, dependence ([place]); Bipolar depression ([place]); Neuropathy, Peripheral ([place]) ([name]) (Location of Primary Source of Pain) (Bilateral) (R>L); Chronic shoulder pain (Right); Chronic radicular low back pain (Location of Tertiary source of pain) (Right) (to calf); Closed fracture of 5th metacarpal; Chronic fatigue; Vitamin D insufficiency; Poor dentition; Chronic maxillary sinusitis; Tender prostate; Erectile dysfunction; Chronic anxiety; GERD (gastroesophageal reflux disease); Suicidal ideation; Bipolar affective disorder, currently depressed, moderate ([place]); Non-sustained ventricular tachycardia ([place]); GAD (generalized anxiety disorder); Alcohol use disorder, severe, in early remission ([place]); Long term current use of opiate analgesic; Long term prescription opiate use; History of fusion of cervical spine; Chronic neck pain (Bilateral)(R>L); Cervical fusion syndrome; Failed back surgical syndrome; Chronic knee pain  (Bilateral) (R>L); Major  depressive disorder, recurrent episode, moderate ([place]); Musculoskeletal pain; 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 His primarily concern today is the Back Pain (low)  Pain Assessment: Self-Reported Pain Score: 4 /10 Clinically the patient looks like a 1/10 Reported level is inconsistent with clinical observations. Information on the proper use of the pain scale provided to the patient today Pain Type: Chronic pain Pain Location: Back Pain Orientation: Lower Pain Descriptors / Indicators: Aching, Constant, Radiating, Sharp, Shooting, Throbbing, Tingling Pain Frequency: Constant  [name] comes in today for post-procedure evaluation after the treatment done on 08/13/2016.  Further details on both, my assessment(s), as well as the proposed treatment plan, please see below.  Post-Procedure Assessment  08/13/2016 Procedure: Diagnostic mid to right sided caudal epidural steroid injection under fluoroscopic guidance and IV sedation Pre-procedure pain score:  4/10 Post-procedure pain score: 0/10 (100% relief) Influential Factors: BMI: 27.39 kg/m Intra-procedural challenges: None observed Assessment challenges: Results reported today are inconsistent with those reported on procedure day, immediately before discharge. Previously the patient had reported 100% relief of the pain, before leaving the facility Post-procedural adverse reactions or complications: None reported Reported side-effects: None  Sedation: Sedation provided. When no sedatives are used, the analgesic levels obtained are directly associated to the effectiveness of the local anesthetics. However, when sedation is provided, the level of analgesia obtained  during the initial 1 hour following the intervention, is believed to be the result of a combination of factors. These factors may include, but are not limited to: 1. The effectiveness of the local anesthetics used. 2. The effects of the analgesic(s) and/or anxiolytic(s) used. 3. The degree of discomfort experienced by the patient at the time of the procedure. 4. The patients ability and reliability in recalling  and recording the events. 5. The presence and influence of possible secondary gains and/or psychosocial factors. Reported result: Relief experienced during the 1st hour after the procedure: 95 % (Ultra-Short Term Relief) Interpretative annotation: Partial relief from local anesthetics would suggest that the injected area is not 100% responsible for the patient's symptoms. Patient does not appear to have understood instructions on differential evaluation of treated vs untreated area, leading to an inaccurate global report  Effects of local anesthetic: The analgesic effects attained during this period are directly associated to the localized infiltration of local anesthetics and therefore cary significant diagnostic value as to the etiological location, or anatomical origin, of the pain. Expected duration of relief is directly dependent on the pharmacodynamics of the local anesthetic used. Long-acting (4-6 hours) anesthetics used.  Reported result: Relief during the next 4 to 6 hour after the procedure: 75 % (Short-Term Relief) Interpretative annotation: Unexpected non-physiological response.          Long-term benefit: Defined as the period of time past the expected duration of local anesthetics. With the possible exception of prolonged sympathetic blockade from the local anesthetics, benefits during this period are typically attributed to, or associated with, other factors such as analgesic sensory neuropraxia, antiinflammatory effects, or beneficial biochemical changes provided by agents other  than the local anesthetics Reported result: Extended relief following procedure: 50 % (lasting 2 days then returned, and the pain was worse. ) (Long-Term Relief) Interpretative annotation: Partial relief. This could suggest the algesic mechanism to be a combination of tissue inflammation and mechanical problems.          Current benefits: Defined as persistent relief that continues at this point in time.   Reported results: Treated area: 0 %       Interpretative annotation: Recurrance of symptoms. This would suggest persistent aggravating factors  Interpretation: Results would suggest failure of therapy in achieving desired goal(s).          Review of Interventional Treatments   Procedure(s) Diagnostic Result(s) Date  Diagnostic right-sided caudal epidural steroid injection #1  50/50/35  04/22/16  Diagnostic right-sided L3 and L4 Lumbar sympathetic block #1 50/50/50  05/22/16   Diagnostic right-sided L3 and L4 Lumbar sympathetic block #2  80/80/0  06/26/16   Diagnostic right-sided caudal epidural steroid injection #2  95/75/50  07/29/16    Laboratory Chemistry  Inflammation Markers Lab Results  Component Value Date   CRP 0.9 03/18/2016   ESRSEDRATE 2 03/18/2016   (CRP: Acute Phase) (ESR: Chronic Phase) Renal Function Markers Lab Results  Component Value Date   BUN 5 (L) 05/15/2016   CREATININE 0.72 05/15/2016   GFRAA >60 05/15/2016   GFRNONAA >60 05/15/2016   Hepatic Function Markers Lab Results  Component Value Date   AST 30 05/15/2016   ALT 40 05/15/2016   ALBUMIN 4.7 05/15/2016   ALKPHOS 87 05/15/2016   HCVAB NEGATIVE 06/14/2012   Electrolytes Lab Results  Component Value Date   NA 138 05/15/2016   K 3.4 (L) 05/15/2016   CL 101 05/15/2016   CALCIUM 9.0 05/15/2016   MG 2.1 03/18/2016   Neuropathy Markers Lab Results  Component Value Date   VITAMINB12 244 03/18/2016   Bone Pathology Markers Lab Results  Component Value Date   ALKPHOS 87 05/15/2016    VD25OH 21 (L) 07/04/2014   25OHVITD1 35 03/18/2016   25OHVITD2 1.0 03/18/2016   25OHVITD3 34 03/18/2016   CALCIUM 9.0 05/15/2016   TESTOFREE 78.9 09/16/2014   TESTOSTERONE 365 09/16/2014  Coagulation Parameters Lab Results  Component Value Date   INR 1.04 06/07/2013   LABPROT 13.4 06/07/2013   PLT 298 05/15/2016   Cardiovascular Markers Lab Results  Component Value Date   BNP 23.9 02/27/2015   HGB 17.7 (H) 05/15/2016   HCT 49.2 05/15/2016   Note: Lab results reviewed.  Recent Diagnostic Imaging Review  Dg C-arm 1-60 Min-no Report  Result Date: 07/29/2016 Fluoroscopy was utilized by the requesting physician.  No radiographic interpretation.   Note: Imaging results reviewed.          Meds  The patient has a current medication list which includes the following prescription(s): baclofen, cyclobenzaprine, gabapentin, gemfibrozil, losartan, metoprolol tartrate, tramadol, trazodone, and ventolin hfa.  Current Outpatient Prescriptions on File Prior to Visit  Medication Sig  . baclofen (LIORESAL) 10 MG tablet Take 1 tablet (10 mg total) by mouth 3 (three) times daily.  . cyclobenzaprine (FLEXERIL) 10 MG tablet Take 1 tablet (10 mg total) by mouth at bedtime.  . gabapentin (NEURONTIN) 800 MG tablet Take 1 tablet (800 mg total) by mouth 4 (four) times daily.  Marland Kitchen gemfibrozil (LOPID) 600 MG tablet Take 600 mg by mouth 2 (two) times daily before a meal.  . losartan (COZAAR) 50 MG tablet Take 50 mg by mouth daily.  . metoprolol (LOPRESSOR) 50 MG tablet Take 1 tablet (50 mg total) by mouth 2 (two) times daily.  . traMADol (ULTRAM) 50 MG tablet Take 2 tablets (100 mg total) by mouth 4 (four) times daily. 1-2 tablets  . traZODone (DESYREL) 100 MG tablet Take 1 tablet (100 mg total) by mouth at bedtime as needed.  . VENTOLIN HFA 108 (90 Base) MCG/ACT inhaler INHALE 2 PUFFS INTO THE LUNGS EVERY 4 HOURS AS NEEDED FOR WHEEZING OR SHORTNESS OF BREATH.   No current facility-administered  medications on file prior to visit.    ROS  Constitutional: Denies any fever or chills Gastrointestinal: No reported hemesis, hematochezia, vomiting, or acute GI distress Musculoskeletal: Denies any acute onset joint swelling, redness, loss of ROM, or weakness Neurological: No reported episodes of acute onset apraxia, aphasia, dysarthria, agnosia, amnesia, paralysis, loss of coordination, or loss of consciousness  Allergies  [name] is allergic to cozaar [losartan] and lisinopril.  [place]  Drug: [name]  reports that he does not use drugs. Alcohol:  reports that he does not drink alcohol. Tobacco:  reports that he has been smoking Cigarettes.  He has a 15.00 pack-year smoking history. He has never used smokeless tobacco. Medical:  has a past medical history of Alcohol withdrawal ([place]); Allergy; Anxiety (Dx 2014); COPD (chronic obstructive pulmonary disease) ([place]); Drug abuse; Enlarged prostate; ETOH abuse; GERD (gastroesophageal reflux disease) (Dx 2003); Headache(784.0); Hepatitis; Hyperlipidemia (Dx 2000); Hypertension (Dx 2011); IBS (irritable bowel syndrome); Irregular heart beat; Long Q-T syndrome (01/23/2014); Mental disorder; Neuromuscular disorder ([place]); Neuropathy (06/07/2013); Seizure due to alcohol withdrawal ([place]) (01/23/2014); Shortness of breath; and Withdrawal seizures ([place]) (2014). Family: family history includes Alcohol abuse in his father and paternal grandfather; Breast cancer in his paternal grandmother; Colon polyps in his mother; Hypertension in his mother; Lung cancer in his father.  Past Surgical History:  Procedure Laterality Date  . [place] STUDY N/A 01/16/2015   Procedure: Colon STUDY;  Surgeon: Manus Gunning, MD;  Location: [facility];  Service: Gastroenterology;  Laterality: N/A;  . [place]  . COLONOSCOPY  1998  . LEFT HEART CATHETERIZATION WITH CORONARY ANGIOGRAM N/A 01/13/2014   Procedure:  LEFT HEART CATHETERIZATION WITH  CORONARY ANGIOGRAM;  Surgeon: Clent Demark, MD;  Location: [facility] CATH LAB;  Service: Cardiovascular;  Laterality: N/A;  . NASAL SINUS SURGERY    . NECK SURGERY  2000   cervial fusion   . NISSEN FUNDOPLICATION    . SUBACROMIAL DECOMPRESSION Right 11/16/2014   Procedure: RIGHT SHOULDER ARTHROSCOPY WITH SUBACROMIAL DECOMPRESSION ;  Surgeon: Leandrew Koyanagi, MD;  Location: [place];  Service: Orthopedics;  Laterality: Right;  . UPPER GASTROINTESTINAL ENDOSCOPY     Constitutional Exam  General appearance: Well nourished, well developed, and well hydrated. In no apparent acute distress Vitals:   08/22/16 0759  BP: 130/87  Pulse: 68  Resp: 18  Temp: 97.6 F (36.4 C)  SpO2: 100%  Weight: 225 lb (102.1 kg)  Height: '6\' 4"'  (1.93 m)   BMI Assessment: Estimated body mass index is 27.39 kg/m as calculated from the following:   Height as of this encounter: '6\' 4"'  (1.93 m).   Weight as of this encounter: 225 lb (102.1 kg).  BMI interpretation table: BMI level Category Range association with higher incidence of chronic pain  <18 kg/m2 Underweight   18.5-24.9 kg/m2 Ideal body weight   25-29.9 kg/m2 Overweight Increased incidence by 20%  30-34.9 kg/m2 Obese (Class I) Increased incidence by 68%  35-39.9 kg/m2 Severe obesity (Class II) Increased incidence by 136%  >40 kg/m2 Extreme obesity (Class III) Increased incidence by 254%   BMI Readings from Last 4 Encounters:  08/22/16 27.39 kg/m  07/29/16 26.78 kg/m  07/22/16 26.78 kg/m  06/26/16 26.17 kg/m   Wt Readings from Last 4 Encounters:  08/22/16 225 lb (102.1 kg)  07/29/16 220 lb (99.8 kg)  07/22/16 220 lb (99.8 kg)  06/26/16 215 lb (97.5 kg)  Psych/Mental status: Alert, oriented x 3 (person, place, & time)       Eyes: PERLA Respiratory: No evidence of acute respiratory distress  Cervical Spine Exam  Inspection: No masses, redness, or swelling Alignment: Symmetrical Functional ROM: Unrestricted ROM      Stability: No  instability detected Muscle strength & Tone: Functionally intact Sensory: Unimpaired Palpation: No palpable anomalies              Upper Extremity (UE) Exam    Side: Right upper extremity  Side: Left upper extremity  Inspection: No masses, redness, swelling, or asymmetry. No contractures  Inspection: No masses, redness, swelling, or asymmetry. No contractures  Functional ROM: Unrestricted ROM          Functional ROM: Unrestricted ROM          Muscle strength & Tone: Functionally intact  Muscle strength & Tone: Functionally intact  Sensory: Unimpaired  Sensory: Unimpaired  Palpation: No palpable anomalies              Palpation: No palpable anomalies              Specialized Test(s): Deferred         Specialized Test(s): Deferred          Thoracic Spine Exam  Inspection: No masses, redness, or swelling Alignment: Symmetrical Functional ROM: Unrestricted ROM Stability: No instability detected Sensory: Unimpaired Muscle strength & Tone: No palpable anomalies  Lumbar Spine Exam  Inspection: No masses, redness, or swelling Alignment: Symmetrical Functional ROM: Unrestricted ROM      Stability: No instability detected Muscle strength & Tone: Functionally intact Sensory: Unimpaired Palpation: No palpable anomalies       Provocative Tests: Lumbar Hyperextension and  rotation test: evaluation deferred today       Patrick's Maneuver: evaluation deferred today                    Gait & Posture Assessment  Ambulation: Unassisted Gait: Relatively normal for age and body habitus Posture: WNL   Lower Extremity Exam    Side: Right lower extremity  Side: Left lower extremity  Inspection: No masses, redness, swelling, or asymmetry. No contractures  Inspection: No masses, redness, swelling, or asymmetry. No contractures  Functional ROM: Unrestricted ROM          Functional ROM: Unrestricted ROM          Muscle strength & Tone: Functionally intact  Muscle strength & Tone: Functionally intact   Sensory: Unimpaired  Sensory: Unimpaired  Palpation: No palpable anomalies  Palpation: No palpable anomalies   Assessment  Primary Diagnosis & Pertinent Problem List: The primary encounter diagnosis was Chronic low back pain (Location of Secondary source of pain) (Bilateral) (R>L). Diagnoses of Lumbar facet syndrome (Right), Chronic sacroiliac joint pain (Right), and Chronic lower extremity pain (Referred) (Bilateral) (R>L) were also pertinent to this visit.  Status Diagnosis  Persistent Untreated Untreated 1. Chronic low back pain (Location of Secondary source of pain) (Bilateral) (R>L)   2. Lumbar facet syndrome (Right)   3. Chronic sacroiliac joint pain (Right)   4. Chronic lower extremity pain (Referred) (Bilateral) (R>L)     Problems updated and reviewed during this visit: Problem  Lumbar facet syndrome (Right)  Chronic sacroiliac joint pain (Right)  Chronic lower extremity pain (Referred) (Bilateral) (R>L)  Occipital headache (Bilateral) (R>L)  Alcohol Use Disorder, Severe, Dependence ([name])  Bipolar Affective Disorder, Depressed, Severe, With Psychotic Behavior ([name])  Major Depressive Disorder, Recurrent Episode, Moderate ([name])   Plan of Care  Pharmacotherapy (Medications Ordered): No orders of the defined types were placed in this encounter.  New Prescriptions   No medications on file   Medications administered today: [name] had no medications administered during this visit. Lab-work, procedure(s), and/or referral(s): Orders Placed This Encounter  Procedures  . LUMBAR FACET(MEDIAL BRANCH NERVE BLOCK) MBNB  . SACROILIAC JOINT INJECTINS   Imaging and/or referral(s): None  Interventional therapies: Planned, scheduled, and/or pending:   Diagnostic right-sided lumbar facet + right-sided sacroiliac joint block under fluoroscopic guidance and IV sedation    Considering:   Lower extremity EMG/PNCV. (Radiculopathy versus peripheral neuropathy) Upper extremity  EMG/PNCV.  Possible right-sided L3 + L4 Lumbar sympathetic RFA  Possible Racz procedure Diagnostic cervical epidural steroid injection  Diagnostic bilateral cervical facet block  Possible bilateral cervical facet RFA Diagnostic bilateral lumbar facet block  Possible bilateral lumbar facet RFA  Diagnostic right intra-articular shoulder joint injection Diagnostic right suprascapular nerve block Possible right suprascapular RFA Diagnostic bilateral intra-articular knee joint injection  Possible bilateral series of 5 [place] knee injections  Diagnostic bilateral Genicular nerve block  Possible bilateral Genicular RFA  Diagnostic bilateral greater occipital nerve block  Possible bilateral greater occipital nerve RFA  Diagnostic bilateral C2 + TON nerve block  Possible bilateral C2 + TON RFA    Palliative PRN treatment(s):   Diagnostic Caudal ESI #3 Diagnostic right-sided L3 + L4 lumbar sympathetic block #3    Provider-requested follow-up: Return for procedure (w/ sedation):, (ASAP), by MD, in addition, Med-Mgmt, by NP.  Future Appointments Date Time Provider [place]  08/26/2016 9:45 AM Milinda Pointer, MD [facility] None  09/02/2016 3:00 PM Daron Offer Richard Miu, MD [facility] None  09/16/2016 10:00 AM Edison Pace,  Diona Foley, NP [facility] None   Primary Care Physician: Boykin Nearing, MD Location: [facility] Note by: Kathlen Brunswick Dossie Arbour, M.D, DABA, DABAPM, DABPM, DABIPP, FIPP Date: 08/22/2016; Time: 5:21 PM  Patient instructions provided during this appointment: Patient Instructions   ____________________________________________________________________________________________  Preparing for Procedure with Sedation Instructions: . Oral Intake: Do not eat or drink anything for at least 8 hours prior to your procedure. . Transportation: Public transportation is not allowed. Bring an adult driver. The driver must be physically present in our waiting  room before any procedure can be started. Marland Kitchen Physical Assistance: Bring an adult physically capable of assisting you, in the event you need help. This adult should keep you company at home for at least 6 hours after the procedure. . Blood Pressure Medicine: Take your blood pressure medicine with a sip of water the morning of the procedure. . Blood thinners:  . Diabetics on insulin: Notify the staff so that you can be scheduled 1st case in the morning. If your diabetes requires high dose insulin, take only  of your normal insulin dose the morning of the procedure and notify the staff that you have done so. . Preventing infections: Shower with an antibacterial soap the morning of your procedure. . Build-up your immune system: Take 1000 mg of Vitamin C with every meal (3 times a day) the day prior to your procedure. Marland Kitchen Antibiotics: Inform the staff if you have a condition or reason that requires you to take antibiotics before dental procedures. . Pregnancy: If you are pregnant, call and cancel the procedure. . Sickness: If you have a cold, fever, or any active infections, call and cancel the procedure. . Arrival: You must be in the facility at least 30 minutes prior to your scheduled procedure. . Children: Do not bring children with you. . Dress appropriately: Bring dark clothing that you would not mind if they get stained. . Valuables: Do not bring any jewelry or valuables. Procedure appointments are reserved for interventional treatments only. Marland Kitchen No Prescription Refills. . No medication changes will be discussed during procedure appointments. . No disability issues will be discussed. ____________________________________________________________________________________________  GENERAL RISKS AND COMPLICATIONS  What are the risk, side effects and possible complications? Generally speaking, most procedures are safe.  However, with any procedure there are risks, side effects, and the possibility of  complications.  The risks and complications are dependent upon the sites that are lesioned, or the type of nerve block to be performed.  The closer the procedure is to the spine, the more serious the risks are.  Great care is taken when placing the radio frequency needles, block needles or lesioning probes, but sometimes complications can occur. 1. Infection: Any time there is an injection through the skin, there is a risk of infection.  This is why sterile conditions are used for these blocks.  There are four possible types of infection. 1. Localized skin infection. 2. Central Nervous System Infection-This can be in the form of Meningitis, which can be deadly. 3. Epidural Infections-This can be in the form of an epidural abscess, which can cause pressure inside of the spine, causing compression of the spinal cord with subsequent paralysis. This would require an emergency surgery to decompress, and there are no guarantees that the patient would recover from the paralysis. 4. Discitis-This is an infection of the intervertebral discs.  It occurs in about 1% of discography procedures.  It is difficult to treat and it may lead to surgery.  2. Pain: the needles have to go through skin and soft tissues, will cause soreness.       3. Damage to internal structures:  The nerves to be lesioned may be near blood vessels or    other nerves which can be potentially damaged.       4. Bleeding: Bleeding is more common if the patient is taking blood thinners such as  aspirin, Coumadin, Ticiid, Plavix, etc., or if he/she have some genetic predisposition  such as hemophilia. Bleeding into the spinal canal can cause compression of the spinal  cord with subsequent paralysis.  This would require an emergency surgery to  decompress and there are no guarantees that the patient would recover from the  paralysis.       5. Pneumothorax:  Puncturing of a lung is a possibility, every time a needle is introduced in  the area of  the chest or upper back.  Pneumothorax refers to free air around the  collapsed lung(s), inside of the thoracic cavity (chest cavity).  Another two possible  complications related to a similar event would include: Hemothorax and Chylothorax.   These are variations of the Pneumothorax, where instead of air around the collapsed  lung(s), you may have blood or chyle, respectively.       6. Spinal headaches: They may occur with any procedures in the area of the spine.       7. Persistent CSF (Cerebro-Spinal Fluid) leakage: This is a rare problem, but may occur  with prolonged intrathecal or epidural catheters either due to the formation of a fistulous  track or a dural tear.       8. Nerve damage: By working so close to the spinal cord, there is always a possibility of  nerve damage, which could be as serious as a permanent spinal cord injury with  paralysis.       9. Death:  Although rare, severe deadly allergic reactions known as "Anaphylactic  reaction" can occur to any of the medications used.      10. Worsening of the symptoms:  We can always make thing worse.  What are the chances of something like this happening? Chances of any of this occuring are extremely low.  By statistics, you have more of a chance of getting killed in a motor vehicle accident: while driving to the hospital than any of the above occurring .  Nevertheless, you should be aware that they are possibilities.  In general, it is similar to taking a shower.  Everybody knows that you can slip, hit your head and get killed.  Does that mean that you should not shower again?  Nevertheless always keep in mind that statistics do not mean anything if you happen to be on the wrong side of them.  Even if a procedure has a 1 (one) in a 1,000,000 (million) chance of going wrong, it you happen to be that one..Also, keep in mind that by statistics, you have more of a chance of having something go wrong when taking medications.  Who should not have  this procedure? If you are on a blood thinning medication (e.g. Coumadin, Plavix, see list of "Blood Thinners"), or if you have an active infection going on, you should not have the procedure.  If you are taking any blood thinners, please inform your physician.  How should I prepare for this procedure?  Do not eat or drink anything at least six hours prior to the procedure.  Bring a driver  with you .  It cannot be a taxi.  Come accompanied by an adult that can drive you back, and that is strong enough to help you if your legs get weak or numb from the local anesthetic.  Take all of your medicines the morning of the procedure with just enough water to swallow them.  If you have diabetes, make sure that you are scheduled to have your procedure done first thing in the morning, whenever possible.  If you have diabetes, take only half of your insulin dose and notify our nurse that you have done so as soon as you arrive at the clinic.  If you are diabetic, but only take blood sugar pills (oral hypoglycemic), then do not take them on the morning of your procedure.  You may take them after you have had the procedure.  Do not take aspirin or any aspirin-containing medications, at least eleven (11) days prior to the procedure.  They may prolong bleeding.  Wear loose fitting clothing that may be easy to take off and that you would not mind if it got stained with Betadine or blood.  Do not wear any jewelry or perfume  Remove any nail coloring.  It will interfere with some of our monitoring equipment.  NOTE: Remember that this is not meant to be interpreted as a complete list of all possible complications.  Unforeseen problems may occur.  BLOOD THINNERS The following drugs contain aspirin or other products, which can cause increased bleeding during surgery and should not be taken for 2 weeks prior to and 1 week after surgery.  If you should need take something for relief of minor pain, you may take  acetaminophen which is found in Tylenol,m Datril, Anacin-3 and Panadol. It is not blood thinner. The products listed below are.  Do not take any of the products listed below in addition to any listed on your instruction sheet.  A.P.C or A.P.C with Codeine Codeine Phosphate Capsules #3 Ibuprofen Ridaura  ABC compound Congesprin Imuran rimadil  Advil Cope Indocin Robaxisal  Alka-Seltzer Effervescent Pain Reliever and Antacid Coricidin or Coricidin-D  Indomethacin Rufen  Alka-Seltzer plus Cold Medicine Cosprin Ketoprofen S-A-C Tablets  Anacin Analgesic Tablets or Capsules Coumadin Korlgesic Salflex  Anacin Extra Strength Analgesic tablets or capsules CP-2 Tablets Lanoril Salicylate  Anaprox Cuprimine Capsules Levenox Salocol  Anexsia-D Dalteparin Magan Salsalate  Anodynos Darvon compound Magnesium Salicylate Sine-off  Ansaid Dasin Capsules Magsal Sodium Salicylate  Anturane Depen Capsules Marnal Soma  APF Arthritis pain formula Dewitt's Pills Measurin Stanback  Argesic Dia-Gesic Meclofenamic Sulfinpyrazone  Arthritis Bayer Timed Release Aspirin Diclofenac Meclomen Sulindac  Arthritis pain formula Anacin Dicumarol Medipren Supac  Analgesic (Safety coated) Arthralgen Diffunasal Mefanamic Suprofen  Arthritis Strength Bufferin Dihydrocodeine Mepro Compound Suprol  Arthropan liquid Dopirydamole Methcarbomol with Aspirin Synalgos  ASA tablets/Enseals Disalcid Micrainin Tagament  Ascriptin Doan's Midol Talwin  Ascriptin A/D Dolene Mobidin Tanderil  Ascriptin Extra Strength Dolobid Moblgesic Ticlid  Ascriptin with Codeine Doloprin or Doloprin with Codeine Momentum Tolectin  Asperbuf Duoprin Mono-gesic Trendar  Aspergum Duradyne Motrin or Motrin IB Triminicin  Aspirin plain, buffered or enteric coated Durasal Myochrisine Trigesic  Aspirin Suppositories Easprin Nalfon Trillsate  Aspirin with Codeine Ecotrin Regular or Extra Strength Naprosyn Uracel  Atromid-S Efficin Naproxen Ursinus  Auranofin  Capsules Elmiron Neocylate Vanquish  Axotal Emagrin Norgesic Verin  Azathioprine Empirin or Empirin with Codeine Normiflo Vitamin E  Azolid Emprazil Nuprin Voltaren  Bayer Aspirin plain, buffered or children's or timed BC Tablets or powders  Encaprin Orgaran Warfarin Sodium  Buff-a-Comp Enoxaparin Orudis Zorpin  Buff-a-Comp with Codeine Equegesic Os-Cal-Gesic   Buffaprin Excedrin plain, buffered or Extra Strength Oxalid   Bufferin Arthritis Strength Feldene Oxphenbutazone   Bufferin plain or Extra Strength Feldene Capsules Oxycodone with Aspirin   Bufferin with Codeine Fenoprofen Fenoprofen Pabalate or Pabalate-SF   Buffets II Flogesic Panagesic   Buffinol plain or Extra Strength Florinal or Florinal with Codeine Panwarfarin   Buf-Tabs Flurbiprofen Penicillamine   Butalbital Compound Four-way cold tablets Penicillin   Butazolidin Fragmin Pepto-Bismol   Carbenicillin Geminisyn Percodan   Carna Arthritis Reliever Geopen Persantine   Carprofen Gold's salt Persistin   Chloramphenicol Goody's Phenylbutazone   Chloromycetin Haltrain Piroxlcam   Clmetidine heparin Plaquenil   Cllnoril Hyco-pap Ponstel   Clofibrate Hydroxy chloroquine Propoxyphen         Before stopping any of these medications, be sure to consult the physician who ordered them.  Some, such as Coumadin (Warfarin) are ordered to prevent or treat serious conditions such as "deep thrombosis", "pumonary embolisms", and other heart problems.  The amount of time that you may need off of the medication may also vary with the medication and the reason for which you were taking it.  If you are taking any of these medications, please make sure you notify your pain physician before you undergo any procedures.         Facet Blocks Patient Information  Description: The facets are joints in the spine between the vertebrae.  Like any joints in the body, facets can become irritated and painful.  Arthritis can also effect the facets.  By  injecting steroids and local anesthetic in and around these joints, we can temporarily block the nerve supply to them.  Steroids act directly on irritated nerves and tissues to reduce selling and inflammation which often leads to decreased pain.  Facet blocks may be done anywhere along the spine from the neck to the low back depending upon the location of your pain.   After numbing the skin with local anesthetic (like Novocaine), a small needle is passed onto the facet joints under x-ray guidance.  You may experience a sensation of pressure while this is being done.  The entire block usually lasts about 15-25 minutes.   Conditions which may be treated by facet blocks:   Low back/buttock pain  Neck/shoulder pain  Certain types of headaches  Preparation for the injection:  1. Do not eat any solid food or dairy products within 8 hours of your appointment. 2. You may drink clear liquid up to 3 hours before appointment.  Clear liquids include water, black coffee, juice or soda.  No milk or cream please. 3. You may take your regular medication, including pain medications, with a sip of water before your appointment.  Diabetics should hold regular insulin (if taken separately) and take 1/2 normal NPH dose the morning of the procedure.  Carry some sugar containing items with you to your appointment. 4. A driver must accompany you and be prepared to drive you home after your procedure. 5. Bring all your current medications with you. 6. An IV may be inserted and sedation may be given at the discretion of the physician. 7. A blood pressure cuff, EKG and other monitors will often be applied during the procedure.  Some patients may need to have extra oxygen administered for a short period. 8. You will be asked to provide medical information, including your allergies and medications, prior to the procedure.  We  must know immediately if you are taking blood thinners (like Coumadin/Warfarin) or if you are  allergic to IV iodine contrast (dye).  We must know if you could possible be pregnant.  Possible side-effects:   Bleeding from needle site  Infection (rare, may require surgery)  Nerve injury (rare)  Numbness & tingling (temporary)  Difficulty urinating (rare, temporary)  Spinal headache (a headache worse with upright posture)  Light-headedness (temporary)  Pain at injection site (serveral days)  Decreased blood pressure (rare, temporary)  Weakness in arm/leg (temporary)  Pressure sensation in back/neck (temporary)   Call if you experience:   Fever/chills associated with headache or increased back/neck pain  Headache worsened by an upright position  New onset, weakness or numbness of an extremity below the injection site  Hives or difficulty breathing (go to the emergency room)  Inflammation or drainage at the injection site(s)  Severe back/neck pain greater than usual  New symptoms which are concerning to you  Please note:  Although the local anesthetic injected can often make your back or neck feel good for several hours after the injection, the pain will likely return. It takes 3-7 days for steroids to work.  You may not notice any pain relief for at least one week.  If effective, we will often do a series of 2-3 injections spaced 3-6 weeks apart to maximally decrease your pain.  After the initial series, you may be a candidate for a more permanent nerve block of the facets.  If you have any questions, please call #[phone]) [facility]

## 2016-08-22 NOTE — Progress Notes (Signed)
Safety precautions to be maintained throughout the outpatient stay will include: orient to surroundings, keep bed in low position, maintain call bell within reach at all times, provide assistance with transfer out of bed and ambulation.  

## 2016-08-26 ENCOUNTER — Encounter: Payer: Self-pay | Admitting: Pain Medicine

## 2016-08-26 ENCOUNTER — Ambulatory Visit
Admission: RE | Admit: 2016-08-26 | Discharge: 2016-08-26 | Disposition: A | Discharge status: Home / Self Care | Payer: Self-pay | Source: Ambulatory Visit | Attending: Pain Medicine | Admitting: Pain Medicine

## 2016-08-26 ENCOUNTER — Ambulatory Visit (HOSPITAL_BASED_OUTPATIENT_CLINIC_OR_DEPARTMENT_OTHER): Payer: Self-pay | Admitting: Pain Medicine

## 2016-08-26 VITALS — BP 127/88 | HR 84 | Temp 96.9°F | Resp 16 | Ht 76.0 in | Wt 225.0 lb

## 2016-08-26 DIAGNOSIS — M5442 Lumbago with sciatica, left side: Secondary | ICD-10-CM

## 2016-08-26 DIAGNOSIS — M4696 Unspecified inflammatory spondylopathy, lumbar region: Secondary | ICD-10-CM

## 2016-08-26 DIAGNOSIS — M533 Sacrococcygeal disorders, not elsewhere classified: Secondary | ICD-10-CM | POA: Insufficient documentation

## 2016-08-26 DIAGNOSIS — M5441 Lumbago with sciatica, right side: Secondary | ICD-10-CM

## 2016-08-26 DIAGNOSIS — G8929 Other chronic pain: Secondary | ICD-10-CM

## 2016-08-26 DIAGNOSIS — M47816 Spondylosis without myelopathy or radiculopathy, lumbar region: Secondary | ICD-10-CM

## 2016-08-26 MED ORDER — METHYLPREDNISOLONE ACETATE 80 MG/ML IJ SUSP
80.0000 mg | Freq: Once | INTRAMUSCULAR | Status: AC
  Administered 2016-08-26: 80 mg via INTRA_ARTICULAR

## 2016-08-26 MED ORDER — ROPIVACAINE HCL 2 MG/ML IJ SOLN
4.0000 mL | Freq: Once | INTRAMUSCULAR | Status: AC
  Administered 2016-08-26: 9 mL via INTRA_ARTICULAR

## 2016-08-26 MED ORDER — FENTANYL CITRATE (PF) 100 MCG/2ML IJ SOLN
INTRAMUSCULAR | Status: AC
  Filled 2016-08-26: qty 2

## 2016-08-26 MED ORDER — MIDAZOLAM HCL 5 MG/5ML IJ SOLN
INTRAMUSCULAR | Status: AC
  Filled 2016-08-26: qty 5

## 2016-08-26 MED ORDER — LACTATED RINGERS IV SOLN
1000.0000 mL | Freq: Once | INTRAVENOUS | Status: AC
  Administered 2016-08-26: 1000 mL via INTRAVENOUS

## 2016-08-26 MED ORDER — METHYLPREDNISOLONE ACETATE 80 MG/ML IJ SUSP
INTRAMUSCULAR | Status: AC
  Filled 2016-08-26: qty 1

## 2016-08-26 MED ORDER — MIDAZOLAM HCL 5 MG/5ML IJ SOLN
1.0000 mg | INTRAMUSCULAR | Status: DC | PRN
  Administered 2016-08-26: 3 mg via INTRAVENOUS

## 2016-08-26 MED ORDER — FENTANYL CITRATE (PF) 100 MCG/2ML IJ SOLN
25.0000 ug | INTRAMUSCULAR | Status: DC | PRN
  Administered 2016-08-26: 100 ug via INTRAVENOUS

## 2016-08-26 MED ORDER — LIDOCAINE HCL (PF) 1 % IJ SOLN
INTRAMUSCULAR | Status: AC
  Filled 2016-08-26: qty 10

## 2016-08-26 MED ORDER — ROPIVACAINE HCL 2 MG/ML IJ SOLN
9.0000 mL | Freq: Once | INTRAMUSCULAR | Status: AC
  Administered 2016-08-26: 9 mL via PERINEURAL

## 2016-08-26 MED ORDER — TRIAMCINOLONE ACETONIDE 40 MG/ML IJ SUSP
40.0000 mg | Freq: Once | INTRAMUSCULAR | Status: AC
  Administered 2016-08-26: 40 mg

## 2016-08-26 MED ORDER — ROPIVACAINE HCL 2 MG/ML IJ SOLN
INTRAMUSCULAR | Status: AC
  Filled 2016-08-26: qty 20

## 2016-08-26 MED ORDER — LIDOCAINE HCL (PF) 1.5 % IJ SOLN
20.0000 mL | Freq: Once | INTRAMUSCULAR | Status: AC
  Administered 2016-08-26: 20 mL
  Filled 2016-08-26: qty 20

## 2016-08-26 MED ORDER — TRIAMCINOLONE ACETONIDE 40 MG/ML IJ SUSP
INTRAMUSCULAR | Status: AC
  Filled 2016-08-26: qty 1

## 2016-08-26 NOTE — Patient Instructions (Addendum)
____________________________________________________________________________________________  Post-Procedure instructions Instructions:  Apply ice: Fill a plastic sandwich bag with crushed ice. Cover it with a small towel and apply to injection site. Apply for 15 minutes then remove x 15 minutes. Repeat sequence on day of procedure, until you go to bed. The purpose is to minimize swelling and discomfort after procedure.  Apply heat: Apply heat to procedure site starting the day following the procedure. The purpose is to treat any soreness and discomfort from the procedure.  Food intake: Start with clear liquids (like water) and advance to regular food, as tolerated.   Physical activities: Keep activities to a minimum for the first 8 hours after the procedure.   Driving: If you have received any sedation, you are not allowed to drive for 24 hours after your procedure.  Blood thinner: Restart your blood thinner 6 hours after your procedure. (Only for those taking blood thinners)  Insulin: As soon as you can eat, you may resume your normal dosing schedule. (Only for those taking insulin)  Infection prevention: Keep procedure site clean and dry.  Post-procedure Pain Diary: Extremely important that this be done correctly and accurately. Recorded information will be used to determine the next step in treatment.  Pain evaluated is that of treated area only. Do not include pain from an untreated area.  Complete every hour, on the hour, for the initial 8 hours. Set an alarm to help you do this part accurately.  Do not go to sleep and have it completed later. It will not be accurate.  Follow-up appointment: Keep your follow-up appointment after the procedure. Usually 2 weeks for most procedures. (6 weeks in the case of radiofrequency.) Bring you pain diary.  Expect:  From numbing medicine (AKA: Local Anesthetics): Numbness or decrease in pain.  Onset: Full effect within 15 minutes of  injected.  Duration: It will depend on the type of local anesthetic used. On the average, 1 to 8 hours.   From steroids: Decrease in swelling or inflammation. Once inflammation is improved, relief of the pain will follow.  Onset of benefits: Depends on the amount of swelling present. The more swelling, the longer it will take for the benefits to be seen. In some cases, up to 10 days.  Duration: Steroids will stay in the system x 2 weeks. Duration of benefits will depend on multiple posibilities including persistent irritating factors.  From procedure: Some discomfort is to be expected once the numbing medicine wears off. This should be minimal if ice and heat are applied as instructed. Call if:  You experience numbness and weakness that gets worse with time, as opposed to wearing off.  New onset bowel or bladder incontinence. (Spinal procedures only)  Emergency Numbers:  Durning business hours (Monday - Thursday, 8:00 AM - 4:00 PM) (Friday, 9:00 AM - 12:00 Noon): [phone]  After hours: [phone] ____________________________________________________________________________________________  Pain Management Discharge Instructions  General Discharge Instructions :  If you need to reach your doctor call: Monday-Friday 8:00 am - 4:00 pm at [phone] or toll free [phone].  After clinic hours [phone] to have operator reach doctor.  Bring all of your medication bottles to all your appointments in the pain clinic.  To cancel or reschedule your appointment with Pain Management please remember to call 24 hours in advance to avoid a fee.  Refer to the educational materials which you have been given on: General Risks, I had my Procedure. Discharge Instructions, Post Sedation.  Post Procedure Instructions:  The drugs you   were given will stay in your system until tomorrow, so for the next 24 hours you should not drive, make any legal decisions or drink any alcoholic  beverages.  You may eat anything you prefer, but it is better to start with liquids then soups and crackers, and gradually work up to solid foods.  Please notify your doctor immediately if you have any unusual bleeding, trouble breathing or pain that is not related to your normal pain.  Depending on the type of procedure that was done, some parts of your body may feel week and/or numb.  This usually clears up by tonight or the next day.  Walk with the use of an assistive device or accompanied by an adult for the 24 hours.  You may use ice on the affected area for the first 24 hours.  Put ice in a Ziploc bag and cover with a towel and place against area 15 minutes on 15 minutes off.  You may switch to heat after 24 hours.Sacroiliac (SI) Joint Injection Patient Information  Description: The sacroiliac joint connects the scrum (very low back and tailbone) to the ilium (a pelvic bone which also forms half of the hip joint).  Normally this joint experiences very little motion.  When this joint becomes inflamed or unstable low back and or hip and pelvis pain may result.  Injection of this joint with local anesthetics (numbing medicines) and steroids can provide diagnostic information and reduce pain.  This injection is performed with the aid of x-ray guidance into the tailbone area while you are lying on your stomach.   You may experience an electrical sensation down the leg while this is being done.  You may also experience numbness.  We also may ask if we are reproducing your normal pain during the injection.  Conditions which may be treated SI injection:   Low back, buttock, hip or leg pain  Preparation for the Injection:  1. Do not eat any solid food or dairy products within 8 hours of your appointment.  2. You may drink clear liquids up to 3 hours before appointment.  Clear liquids include water, black coffee, juice or soda.  No milk or cream please. 3. You may take your regular medications,  including pain medications with a sip of water before your appointment.  Diabetics should hold regular insulin (if take separately) and take 1/2 normal NPH dose the morning of the procedure.  Carry some sugar containing items with you to your appointment. 4. A driver must accompany you and be prepared to drive you home after your procedure. 5. Bring all of your current medications with you. 6. An IV may be inserted and sedation may be given at the discretion of the physician. 7. A blood pressure cuff, EKG and other monitors will often be applied during the procedure.  Some patients may need to have extra oxygen administered for a short period.  8. You will be asked to provide medical information, including your allergies, prior to the procedure.  We must know immediately if you are taking blood thinners (like Coumadin/Warfarin) or if you are allergic to IV iodine contrast (dye).  We must know if you could possible be pregnant.  Possible side effects:   Bleeding from needle site  Infection (rare, may require surgery)  Nerve injury (rare)  Numbness & tingling (temporary)  A brief convulsion or seizure  Light-headedness (temporary)  Pain at injection site (several days)  Decreased blood pressure (temporary)  Weakness in the leg (  temporary)   Call if you experience:   New onset weakness or numbness of an extremity below the injection site that last more than 8 hours.  Hives or difficulty breathing ( go to the emergency room)  Inflammation or drainage at the injection site  Any new symptoms which are concerning to you  Please note:  Although the local anesthetic injected can often make your back/ hip/ buttock/ leg feel good for several hours after the injections, the pain will likely return.  It takes 3-7 days for steroids to work in the sacroiliac area.  You may not notice any pain relief for at least that one week.  If effective, we will often do a series of three injections  spaced 3-6 weeks apart to maximally decrease your pain.  After the initial series, we generally will wait some months before a repeat injection of the same type.  If you have any questions, please call [phone] [facility]  Pain Management Discharge Instructions  General Discharge Instructions :  If you need to reach your doctor call: Monday-Friday 8:00 am - 4:00 pm at [phone] or toll free [phone].  After clinic hours [phone] to have operator reach doctor.  Bring all of your medication bottles to all your appointments in the pain clinic.  To cancel or reschedule your appointment with Pain Management please remember to call 24 hours in advance to avoid a fee.  Refer to the educational materials which you have been given on: General Risks, I had my Procedure. Discharge Instructions, Post Sedation.  Post Procedure Instructions:  The drugs you were given will stay in your system until tomorrow, so for the next 24 hours you should not drive, make any legal decisions or drink any alcoholic beverages.  You may eat anything you prefer, but it is better to start with liquids then soups and crackers, and gradually work up to solid foods.  Please notify your doctor immediately if you have any unusual bleeding, trouble breathing or pain that is not related to your normal pain.  Depending on the type of procedure that was done, some parts of your body may feel week and/or numb.  This usually clears up by tonight or the next day.  Walk with the use of an assistive device or accompanied by an adult for the 24 hours.  You may use ice on the affected area for the first 24 hours.  Put ice in a Ziploc bag and cover with a towel and place against area 15 minutes on 15 minutes off.  You may switch to heat after 24 hours.GENERAL RISKS AND COMPLICATIONS  What are the risk, side effects and possible complications? Generally speaking, most procedures are  safe.  However, with any procedure there are risks, side effects, and the possibility of complications.  The risks and complications are dependent upon the sites that are lesioned, or the type of nerve block to be performed.  The closer the procedure is to the spine, the more serious the risks are.  Great care is taken when placing the radio frequency needles, block needles or lesioning probes, but sometimes complications can occur. 1. Infection: Any time there is an injection through the skin, there is a risk of infection.  This is why sterile conditions are used for these blocks.  There are four possible types of infection. 1. Localized skin infection. 2. Central Nervous System Infection-This can be in the form of Meningitis, which can be deadly. 3. Epidural  Infections-This can be in the form of an epidural abscess, which can cause pressure inside of the spine, causing compression of the spinal cord with subsequent paralysis. This would require an emergency surgery to decompress, and there are no guarantees that the patient would recover from the paralysis. 4. Discitis-This is an infection of the intervertebral discs.  It occurs in about 1% of discography procedures.  It is difficult to treat and it may lead to surgery.        2. Pain: the needles have to go through skin and soft tissues, will cause soreness.       3. Damage to internal structures:  The nerves to be lesioned may be near blood vessels or    other nerves which can be potentially damaged.       4. Bleeding: Bleeding is more common if the patient is taking blood thinners such as  aspirin, Coumadin, Ticiid, Plavix, etc., or if he/she have some genetic predisposition  such as hemophilia. Bleeding into the spinal canal can cause compression of the spinal  cord with subsequent paralysis.  This would require an emergency surgery to  decompress and there are no guarantees that the patient would recover from the  paralysis.       5. Pneumothorax:   Puncturing of a lung is a possibility, every time a needle is introduced in  the area of the chest or upper back.  Pneumothorax refers to free air around the  collapsed lung(s), inside of the thoracic cavity (chest cavity).  Another two possible  complications related to a similar event would include: Hemothorax and Chylothorax.   These are variations of the Pneumothorax, where instead of air around the collapsed  lung(s), you may have blood or chyle, respectively.       6. Spinal headaches: They may occur with any procedures in the area of the spine.       7. Persistent CSF (Cerebro-Spinal Fluid) leakage: This is a rare problem, but may occur  with prolonged intrathecal or epidural catheters either due to the formation of a fistulous  track or a dural tear.       8. Nerve damage: By working so close to the spinal cord, there is always a possibility of  nerve damage, which could be as serious as a permanent spinal cord injury with  paralysis.       9. Death:  Although rare, severe deadly allergic reactions known as "Anaphylactic  reaction" can occur to any of the medications used.      10. Worsening of the symptoms:  We can always make thing worse.  What are the chances of something like this happening? Chances of any of this occuring are extremely low.  By statistics, you have more of a chance of getting killed in a motor vehicle accident: while driving to the hospital than any of the above occurring .  Nevertheless, you should be aware that they are possibilities.  In general, it is similar to taking a shower.  Everybody knows that you can slip, hit your head and get killed.  Does that mean that you should not shower again?  Nevertheless always keep in mind that statistics do not mean anything if you happen to be on the wrong side of them.  Even if a procedure has a 1 (one) in a 1,000,000 (million) chance of going wrong, it you happen to be that one..Also, keep in mind that by statistics, you have more of  a chance  of having something go wrong when taking medications.  Who should not have this procedure? If you are on a blood thinning medication (e.g. Coumadin, Plavix, see list of "Blood Thinners"), or if you have an active infection going on, you should not have the procedure.  If you are taking any blood thinners, please inform your physician.  How should I prepare for this procedure?  Do not eat or drink anything at least six hours prior to the procedure.  Bring a driver with you .  It cannot be a taxi.  Come accompanied by an adult that can drive you back, and that is strong enough to help you if your legs get weak or numb from the local anesthetic.  Take all of your medicines the morning of the procedure with just enough water to swallow them.  If you have diabetes, make sure that you are scheduled to have your procedure done first thing in the morning, whenever possible.  If you have diabetes, take only half of your insulin dose and notify our nurse that you have done so as soon as you arrive at the clinic.  If you are diabetic, but only take blood sugar pills (oral hypoglycemic), then do not take them on the morning of your procedure.  You may take them after you have had the procedure.  Do not take aspirin or any aspirin-containing medications, at least eleven (11) days prior to the procedure.  They may prolong bleeding.  Wear loose fitting clothing that may be easy to take off and that you would not mind if it got stained with Betadine or blood.  Do not wear any jewelry or perfume  Remove any nail coloring.  It will interfere with some of our monitoring equipment.  NOTE: Remember that this is not meant to be interpreted as a complete list of all possible complications.  Unforeseen problems may occur.  BLOOD THINNERS The following drugs contain aspirin or other products, which can cause increased bleeding during surgery and should not be taken for 2 weeks prior to and 1 week  after surgery.  If you should need take something for relief of minor pain, you may take acetaminophen which is found in Tylenol,m Datril, Anacin-3 and Panadol. It is not blood thinner. The products listed below are.  Do not take any of the products listed below in addition to any listed on your instruction sheet.  A.P.C or A.P.C with Codeine Codeine Phosphate Capsules #3 Ibuprofen Ridaura  ABC compound Congesprin Imuran rimadil  Advil Cope Indocin Robaxisal  Alka-Seltzer Effervescent Pain Reliever and Antacid Coricidin or Coricidin-D  Indomethacin Rufen  Alka-Seltzer plus Cold Medicine Cosprin Ketoprofen S-A-C Tablets  Anacin Analgesic Tablets or Capsules Coumadin Korlgesic Salflex  Anacin Extra Strength Analgesic tablets or capsules CP-2 Tablets Lanoril Salicylate  Anaprox Cuprimine Capsules Levenox Salocol  Anexsia-D Dalteparin Magan Salsalate  Anodynos Darvon compound Magnesium Salicylate Sine-off  Ansaid Dasin Capsules Magsal Sodium Salicylate  Anturane Depen Capsules Marnal Soma  APF Arthritis pain formula Dewitt's Pills Measurin Stanback  Argesic Dia-Gesic Meclofenamic Sulfinpyrazone  Arthritis Bayer Timed Release Aspirin Diclofenac Meclomen Sulindac  Arthritis pain formula Anacin Dicumarol Medipren Supac  Analgesic (Safety coated) Arthralgen Diffunasal Mefanamic Suprofen  Arthritis Strength Bufferin Dihydrocodeine Mepro Compound Suprol  Arthropan liquid Dopirydamole Methcarbomol with Aspirin Synalgos  ASA tablets/Enseals Disalcid Micrainin Tagament  Ascriptin Doan's Midol Talwin  Ascriptin A/D Dolene Mobidin Tanderil  Ascriptin Extra Strength Dolobid Moblgesic Ticlid  Ascriptin with Codeine Doloprin or Doloprin with Codeine Momentum Tolectin  Asperbuf Duoprin Mono-gesic Trendar  Aspergum Duradyne Motrin or Motrin IB Triminicin  Aspirin plain, buffered or enteric coated Durasal Myochrisine Trigesic  Aspirin Suppositories Easprin Nalfon Trillsate  Aspirin with Codeine Ecotrin  Regular or Extra Strength Naprosyn Uracel  Atromid-S Efficin Naproxen Ursinus  Auranofin Capsules Elmiron Neocylate Vanquish  Axotal Emagrin Norgesic Verin  Azathioprine Empirin or Empirin with Codeine Normiflo Vitamin E  Azolid Emprazil Nuprin Voltaren  Bayer Aspirin plain, buffered or children's or timed BC Tablets or powders Encaprin Orgaran Warfarin Sodium  Buff-a-Comp Enoxaparin Orudis Zorpin  Buff-a-Comp with Codeine Equegesic Os-Cal-Gesic   Buffaprin Excedrin plain, buffered or Extra Strength Oxalid   Bufferin Arthritis Strength Feldene Oxphenbutazone   Bufferin plain or Extra Strength Feldene Capsules Oxycodone with Aspirin   Bufferin with Codeine Fenoprofen Fenoprofen Pabalate or Pabalate-SF   Buffets II Flogesic Panagesic   Buffinol plain or Extra Strength Florinal or Florinal with Codeine Panwarfarin   Buf-Tabs Flurbiprofen Penicillamine   Butalbital Compound Four-way cold tablets Penicillin   Butazolidin Fragmin Pepto-Bismol   Carbenicillin Geminisyn Percodan   Carna Arthritis Reliever Geopen Persantine   Carprofen Gold's salt Persistin   Chloramphenicol Goody's Phenylbutazone   Chloromycetin Haltrain Piroxlcam   Clmetidine heparin Plaquenil   Cllnoril Hyco-pap Ponstel   Clofibrate Hydroxy chloroquine Propoxyphen         Before stopping any of these medications, be sure to consult the physician who ordered them.  Some, such as Coumadin (Warfarin) are ordered to prevent or treat serious conditions such as "deep thrombosis", "pumonary embolisms", and other heart problems.  The amount of time that you may need off of the medication may also vary with the medication and the reason for which you were taking it.  If you are taking any of these medications, please make sure you notify your pain physician before you undergo any procedures.          Facet Joint Block, Care After Refer to this sheet in the next few weeks. These instructions provide you with information about  caring for yourself after your procedure. Your health care provider may also give you more specific instructions. Your treatment has been planned according to current medical practices, but problems sometimes occur. Call your health care provider if you have any problems or questions after your procedure. What can I expect after the procedure? After the procedure, it is common to have:  Some tenderness over the injection sites for 2 days after the procedure.  A temporary increase in blood sugar if you have diabetes.  Follow these instructions at home:  Keep track of the amount of pain relief you feel and how long it lasts.  Take over-the-counter and prescription medicines only as told by your health care provider. You may need to limit pain medicine within the first 4-6 hours after the procedure.  Remove your bandages (dressings) the morning after the procedure.  For the first 24 hours after the procedure: ? Do not apply heat near or over the injection sites. ? Do not take a bath or soak in water, such as in a pool or lake. ? Do not drive or operate heavy machinery unless approved by your health care provider. ? Avoid activities that require a lot of energy.  If the injection site is tender, try applying ice to the area. To do this: ? Put ice in a plastic bag. ? Place a towel between your skin and the bag. ? Leave the ice on for 20 minutes, 2-3 times a day.  Keep all follow-up visits as told by your health care provider. This is important. Contact a health care provider if:  Fluid is coming from an injection site.  There is significant bleeding or swelling at an injection site.  You have diabetes and your blood sugar is above 180 mg/dL. Get help right away if:  You have a fever.  You have worsening pain or swelling around an injection site.  There are red streaks around an injection site.  You develop severe pain that is not controlled by your medicines.  You develop a  headache, stiff neck, nausea, or vomiting.  Your eyes become very sensitive to light.  You have weakness, paralysis, or tingling in your arms or legs that was not present before the procedure.  You have difficulty urinating or breathing. This information is not intended to replace advice given to you by your health care provider. Make sure you discuss any questions you have with your health care provider. Document Released: 02/12/2012 Document Revised: 07/12/2015 Document Reviewed: 11/21/2014 Elsevier Interactive Patient Education  2018 [employer].  Facet Joint Block The facet joints connect the bones of the spine (vertebrae). They make it possible for you to bend, twist, and make other movements with your spine. They also keep you from bending too far, twisting too far, and making other excessive movements. A facet joint block is a procedure where a numbing medicine (anesthetic) is injected into a facet joint. Often, a type of anti-inflammatory medicine called a steroid is also injected. A facet joint block may be done to diagnose neck or back pain. If the pain gets better after a facet joint block, it means the pain is probably coming from the facet joint. If the pain does not get better, it means the pain is probably not coming from the facet joint. A facet joint block may also be done to relieve neck or back pain caused by an inflamed facet joint. A facet joint block is only done to relieve pain if the pain does not improve with other methods, such as medicine, exercise programs, and physical therapy. Tell a health care provider about: Any allergies you have. All medicines you are taking, including vitamins, herbs, eye drops, creams, and over-the-counter medicines. Any problems you or family members have had with anesthetic medicines. Any blood disorders you have. Any surgeries you have had. Any medical conditions you have. Whether you are pregnant or may be pregnant. What are the  risks? Generally, this is a safe procedure. However, problems may occur, including: Bleeding. Injury to a nerve near the injection site. Pain at the injection site. Weakness or numbness in areas controlled by nerves near the injection site. Infection. Temporary fluid retention. Allergic reactions to medicines or dyes. Injury to other structures or organs near the injection site.  What happens before the procedure? Follow instructions from your health care provider about eating or drinking restrictions. Ask your health care provider about: Changing or stopping your regular medicines. This is especially important if you are taking diabetes medicines or blood thinners. Taking medicines such as aspirin and ibuprofen. These medicines can thin your blood. Do not take these medicines before your procedure if your health care provider instructs you not to. Do not take any new dietary supplements or medicines without asking your health care provider first. Plan to have someone take you home after the procedure. What happens during the procedure? You may need to remove your clothing and dress in an open-back gown. The procedure will  be done while you are lying on an X-ray table. You will most likely be asked to lie on your stomach, but you may be asked to lie in a different position if an injection will be made in your neck. Machines will be used to monitor your oxygen levels, heart rate, and blood pressure. If an injection will be made in your neck, an IV tube will be inserted into one of your veins. Fluids and medicine will flow directly into your body through the IV tube. The area over the facet joint where the injection will be made will be cleaned with soap. The surrounding skin will be covered with clean drapes. A numbing medicine (local anesthetic) will be applied to your skin. Your skin may sting or burn for a moment. A video X-ray machine (fluoroscopy) will be used to locate the joint. In  some cases, a CT scan may be used. A contrast dye may be injected into the facet joint area to help locate the joint. When the joint is located, an anesthetic will be injected into the joint through the needle. Your health care provider will ask you whether you feel pain relief. If you do feel relief, a steroid may be injected to provide pain relief for a longer period of time. If you do not feel relief or feel only partial relief, additional injections of an anesthetic may be made in other facet joints. The needle will be removed. Your skin will be cleaned. A bandage (dressing) will be applied over each injection site. The procedure may vary among health care providers and hospitals. What happens after the procedure? You will be observed for 15-30 minutes before being allowed to go home. This information is not intended to replace advice given to you by your health care provider. Make sure you discuss any questions you have with your health care provider. Document Released: 07/17/2006 Document Revised: 03/29/2015 Document Reviewed: 11/21/2014 [employer] Interactive Patient Education  [employer].

## 2016-08-26 NOTE — Progress Notes (Signed)
Safety precautions to be maintained throughout the outpatient stay will include: orient to surroundings, keep bed in low position, maintain call bell within reach at all times, provide assistance with transfer out of bed and ambulation.  

## 2016-08-26 NOTE — Progress Notes (Signed)
Patient's Name: Mark [name]  MRN: [id number]  Referring Provider: Boykin Nearing, MD  DOB: 1965/11/02  PCP: Boykin Nearing, MD  DOS: 08/26/2016  Note by: Kathlen Brunswick. Dossie Arbour, MD  Service setting: Ambulatory outpatient  Location: [facility] (AMB) Pain Management Facility  Visit type: Procedure  Specialty: Interventional Pain Management  Patient type: Established   Primary Reason for Visit: Interventional Pain Management Treatment. CC: Back Pain (lower)  Procedure:  Anesthesia, Analgesia, Anxiolysis:  Procedure #1: Type: Diagnostic Medial Branch Facet Block Region: Lumbar Level: L2, L3, L4, L5, & S1 Medial Branch Level(s) Laterality: Right  Procedure #2: Type: Diagnostic Sacroiliac Joint Block Region: Posterior Lumbosacral Level: PSIS (Posterior Superior Iliac Spine) Sacroiliac Joint Laterality: Right  Type: Local Anesthesia with Moderate (Conscious) Sedation Local Anesthetic: Lidocaine 1% Route: Intravenous (IV) IV Access: Secured Sedation: Meaningful verbal contact was maintained at all times during the procedure  Indication(s): Analgesia and Anxiety  Indications: 1. Lumbar facet syndrome (Right)   2. Chronic sacroiliac joint pain (Right)   3. Chronic low back pain (Location of Secondary source of pain) (Bilateral) (R>L)    Pain Score: Pre-procedure: 4 /10 Post-procedure: 2 /10  Pre-op Assessment:  Previous date of service: 08/22/16 Service provided: Evaluation Mr. Tupper is a 51 y.o. (year old), male patient, seen today for interventional treatment. He  has a past surgical history that includes Back surgery (1995, 1999); Neck surgery (2000); Nasal sinus surgery; left heart catheterization with coronary angiogram (N/A, 01/13/2014); Subacromial decompression (Right, 11/16/2014); Nissen fundoplication; 24 hour ph study (N/A, 01/16/2015); Upper gastrointestinal endoscopy; and Colonoscopy (1998). His primarily concern today is the Back Pain (lower)  Initial Vital Signs: Blood  pressure 119/83, pulse 84, temperature 98.2 F (36.8 C), resp. rate 16, height 6\' 4"  (1.93 m), weight 225 lb (102.1 kg), SpO2 98 %. BMI: 27.39 kg/m  Risk Assessment: Allergies: Reviewed. He is allergic to cozaar [losartan] and lisinopril.  Allergy Precautions: None required Coagulopathies: Reviewed. None identified.  Blood-thinner therapy: None at this time Active Infection(s): Reviewed. None identified. Mr. Lavallee is afebrile  Site Confirmation: Mr. Lambertson was asked to confirm the procedure and laterality before marking the site Procedure checklist: Completed Consent: Before the procedure and under the influence of no sedative(s), amnesic(s), or anxiolytics, the patient was informed of the treatment options, risks and possible complications. To fulfill our ethical and legal obligations, as recommended by the American Medical Association's Code of Ethics, I have informed the patient of my clinical impression; the nature and purpose of the treatment or procedure; the risks, benefits, and possible complications of the intervention; the alternatives, including doing nothing; the risk(s) and benefit(s) of the alternative treatment(s) or procedure(s); and the risk(s) and benefit(s) of doing nothing. The patient was provided information about the general risks and possible complications associated with the procedure. These may include, but are not limited to: failure to achieve desired goals, infection, bleeding, organ or nerve damage, allergic reactions, paralysis, and death. In addition, the patient was informed of those risks and complications associated to Spine-related procedures, such as failure to decrease pain; infection (i.e.: Meningitis, epidural or intraspinal abscess); bleeding (i.e.: epidural hematoma, subarachnoid hemorrhage, or any other type of intraspinal or peri-dural bleeding); organ or nerve damage (i.e.: Any type of peripheral nerve, nerve root, or spinal cord injury) with  subsequent damage to sensory, motor, and/or autonomic systems, resulting in permanent pain, numbness, and/or weakness of one or several areas of the body; allergic reactions; (i.e.: anaphylactic reaction); and/or death. Furthermore, the patient was informed of  those risks and complications associated with the medications. These include, but are not limited to: allergic reactions (i.e.: anaphylactic or anaphylactoid reaction(s)); adrenal axis suppression; blood sugar elevation that in diabetics may result in ketoacidosis or comma; water retention that in patients with history of congestive heart failure may result in shortness of breath, pulmonary edema, and decompensation with resultant heart failure; weight gain; swelling or edema; medication-induced neural toxicity; particulate matter embolism and blood vessel occlusion with resultant organ, and/or nervous system infarction; and/or aseptic necrosis of one or more joints. Finally, the patient was informed that Medicine is not an exact science; therefore, there is also the possibility of unforeseen or unpredictable risks and/or possible complications that may result in a catastrophic outcome. The patient indicated having understood very clearly. We have given the patient no guarantees and we have made no promises. Enough time was given to the patient to ask questions, all of which were answered to the patient's satisfaction. Mr. Germano has indicated that he wanted to continue with the procedure. Attestation: I, the ordering provider, attest that I have discussed with the patient the benefits, risks, side-effects, alternatives, likelihood of achieving goals, and potential problems during recovery for the procedure that I have provided informed consent. Date: 08/26/2016; Time: 10:47 AM  Pre-Procedure Preparation:  Monitoring: As per clinic protocol. Respiration, ETCO2, SpO2, BP, heart rate and rhythm monitor placed and checked for adequate function Safety  Precautions: Patient was assessed for positional comfort and pressure points before starting the procedure. Time-out: I initiated and conducted the "Time-out" before starting the procedure, as per protocol. The patient was asked to participate by confirming the accuracy of the "Time Out" information. Verification of the correct person, site, and procedure were performed and confirmed by me, the nursing staff, and the patient. "Time-out" conducted as per Joint Commission's Universal Protocol (UP.01.01.01). "Time-out" Date & Time: 08/26/2016; 1126 hrs.  Description of Procedure #1 Process:   Time-out: "Time-out" completed before starting procedure, as per protocol. Position: Prone Target Area: For Lumbar Facet blocks, the target is the groove formed by the junction of the transverse process and superior articular process. For the L5 dorsal ramus, the target is the notch between superior articular process and sacral ala. For the S1 dorsal ramus, the target is the superior and lateral edge of the posterior S1 Sacral foramen. Approach: Paramedial approach. Area Prepped: Entire Posterior Lumbosacral Region Prepping solution: ChloraPrep (2% chlorhexidine gluconate and 70% isopropyl alcohol) Safety Precautions: Aspiration looking for blood return was conducted prior to all injections. At no point did we inject any substances, as a needle was being advanced. No attempts were made at seeking any paresthesias. Safe injection practices and needle disposal techniques used. Medications properly checked for expiration dates. SDV (single dose vial) medications used.  Description of the Procedure: Protocol guidelines were followed. The patient was placed in position over the fluoroscopy table. The target area was identified and the area prepped in the usual manner. Skin desensitized using vapocoolant spray. Skin & deeper tissues infiltrated with local anesthetic. Appropriate amount of time allowed to pass for local  anesthetics to take effect. The procedure needle was introduced through the skin, ipsilateral to the reported pain, and advanced to the target area. Employing the "Medial Branch Technique", the needles were advanced to the angle made by the superior and medial portion of the transverse process, and the lateral and inferior portion of the superior articulating process of the targeted vertebral bodies. This area is known as "Burton's Eye" or the "  Eye of the [place] Dog". A procedure needle was introduced through the skin, and this time advanced to the angle made by the superior and medial border of the sacral ala, and the lateral border of the S1 vertebral body. This last needle was later repositioned at the superior and lateral border of the posterior S1 foramen. Negative aspiration confirmed. Solution injected in intermittent fashion, asking for systemic symptoms every 0.5cc of injectate. The needles were then removed and the area cleansed, making sure to leave some of the prepping solution back to take advantage of its long term bactericidal properties. Start Time: 1126 hrs. Materials:  Needle(s) Type: Regular needle Gauge: 22G Length: 3.5-in Medication(s): We administered lactated ringers, midazolam, fentaNYL, lidocaine, triamcinolone acetonide, ropivacaine (PF) 2 mg/mL (0.2%), methylPREDNISolone acetate, and ropivacaine (PF) 2 mg/mL (0.2%). Please see chart orders for dosing details.  Description of Procedure # 2 Process:   Position: Prone Target Area: For upper sacroiliac joint block(s), the target is the superior and posterior margin of the sacroiliac joint. Approach: Ipsilateral approach. Area Prepped: Entire Posterior Lumbosacral Region Prepping solution: ChloraPrep (2% chlorhexidine gluconate and 70% isopropyl alcohol) Safety Precautions: Aspiration looking for blood return was conducted prior to all injections. At no point did we inject any substances, as a needle was being advanced. No  attempts were made at seeking any paresthesias. Safe injection practices and needle disposal techniques used. Medications properly checked for expiration dates. SDV (single dose vial) medications used. Description of the Procedure: Protocol guidelines were followed. The patient was placed in position over the fluoroscopy table. The target area was identified and the area prepped in the usual manner. Skin desensitized using vapocoolant spray. Skin & deeper tissues infiltrated with local anesthetic. Appropriate amount of time allowed to pass for local anesthetics to take effect. The procedure needle was advanced under fluoroscopic guidance into the sacroiliac joint until a firm endpoint was obtained. Proper needle placement secured. Negative aspiration confirmed. Solution injected in intermittent fashion, asking for systemic symptoms every 0.5cc of injectate. The needles were then removed and the area cleansed, making sure to leave some of the prepping solution back to take advantage of its long term bactericidal properties. Vitals:   08/26/16 1141 08/26/16 1151 08/26/16 1201 08/26/16 1211  BP: (!) 149/103 (!) 164/108 (!) 164/108 127/88  Pulse:      Resp: 14 18 19 16   Temp:  (!) 96.9 F (36.1 C)    SpO2: 97% 96% 97% 98%  Weight:      Height:        End Time: 1141 hrs. Materials:  Needle(s) Type: Regular needle Gauge: 22G Length: 3.5-in Medication(s): We administered lactated ringers, midazolam, fentaNYL, lidocaine, triamcinolone acetonide, ropivacaine (PF) 2 mg/mL (0.2%), methylPREDNISolone acetate, and ropivacaine (PF) 2 mg/mL (0.2%). Please see chart orders for dosing details.  Imaging Guidance (Spinal):  Type of Imaging Technique: Fluoroscopy Guidance (Spinal) Indication(s): Assistance in needle guidance and placement for procedures requiring needle placement in or near specific anatomical locations not easily accessible without such assistance. Exposure Time: Please see nurses  notes. Contrast: None used. Fluoroscopic Guidance: I was personally present during the use of fluoroscopy. "Tunnel Vision Technique" used to obtain the best possible view of the target area. Parallax error corrected before commencing the procedure. "Direction-depth-direction" technique used to introduce the needle under continuous pulsed fluoroscopy. Once target was reached, antero-posterior, oblique, and lateral fluoroscopic projection used confirm needle placement in all planes. Images permanently stored in EMR. Interpretation: No contrast injected. I personally interpreted the imaging  intraoperatively. Adequate needle placement confirmed in multiple planes. Permanent images saved into the patient's record.  Antibiotic Prophylaxis:  Indication(s): None identified Antibiotic given: None  Post-operative Assessment:  EBL: None Complications: No immediate post-treatment complications observed by team, or reported by patient. Note: The patient tolerated the entire procedure well. A repeat set of vitals were taken after the procedure and the patient was kept under observation following institutional policy, for this type of procedure. Post-procedural neurological assessment was performed, showing return to baseline, prior to discharge. The patient was provided with post-procedure discharge instructions, including a section on how to identify potential problems. Should any problems arise concerning this procedure, the patient was given instructions to immediately contact us, at any time, without hesitation. In any case, we plan to contact the patient by telephone for a follow-up status report regarding this interventional procedure. Comments:  No additional relevant information.  Plan of Care  Disposition: Discharge home  Discharge Date & Time: 08/26/2016; 1211 hrs.  Physician-requested Follow-up:  Return for post-procedure eval (in 2 wks).  Future Appointments Date Time Provider [place]   09/02/2016 3:00 PM Daron Offer, Richard Miu, MD [facility] None  09/16/2016 10:00 AM Vevelyn Francois, NP [facility] None  09/26/2016 11:00 AM Milinda Pointer, MD [facility] None   Medications ordered for procedure: Meds ordered this encounter  Medications  . lactated ringers infusion 1,000 mL  . midazolam (VERSED) 5 MG/5ML injection 1-2 mg    Make sure Flumazenil is available in the pyxis when using this medication. If oversedation occurs, administer 0.2 mg IV over 15 sec. If after 45 sec no response, administer 0.2 mg again over 1 min; may repeat at 1 min intervals; not to exceed 4 doses (1 mg)  . fentaNYL (SUBLIMAZE) injection 25-50 mcg    Make sure Narcan is available in the pyxis when using this medication. In the event of respiratory depression (RR< 8/min): Titrate NARCAN (naloxone) in increments of 0.1 to 0.2 mg IV at 2-3 minute intervals, until desired degree of reversal.  . lidocaine 1.5 % injection 20 mL    From block tray  . triamcinolone acetonide (KENALOG-40) injection 40 mg  . ropivacaine (PF) 2 mg/mL (0.2%) (NAROPIN) injection 9 mL  . methylPREDNISolone acetate (DEPO-MEDROL) injection 80 mg  . ropivacaine (PF) 2 mg/mL (0.2%) (NAROPIN) injection 4 mL   Medications administered: We administered lactated ringers, midazolam, fentaNYL, lidocaine, triamcinolone acetonide, ropivacaine (PF) 2 mg/mL (0.2%), methylPREDNISolone acetate, and ropivacaine (PF) 2 mg/mL (0.2%).  See the medical record for exact dosing, route, and time of administration.  Lab-work, Procedure(s), & Referral(s) Ordered: Orders Placed This Encounter  Procedures  . LUMBAR FACET(MEDIAL BRANCH NERVE BLOCK) MBNB  . SACROILIAC JOINT INJECTINS  . DG C-Arm 1-60 Min-No Report  . Informed Consent Details: Transcribe to consent form and obtain patient signature  . Provider attestation of informed consent for procedure/surgical case  . Verify informed consent  . Discharge instructions  . Follow-up   Imaging  Ordered: Results for orders placed in visit on 07/29/16  DG C-Arm 1-60 Min-No Report   Narrative Fluoroscopy was utilized by the requesting physician.  No radiographic  interpretation.    New Prescriptions   No medications on file   Primary Care Physician: Boykin Nearing, MD Location: [facility] Note by: Kathlen Brunswick. Dossie Arbour, M.D, DABA, DABAPM, DABPM, DABIPP, FIPP Date: 08/26/2016; Time: 12:56 PM  Disclaimer:  Medicine is not an exact science. The only guarantee in medicine is that nothing is guaranteed. It is  important to note that the decision to proceed with this intervention was based on the information collected from the patient. The Data and conclusions were drawn from the patient's questionnaire, the interview, and the physical examination. Because the information was provided in large part by the patient, it cannot be guaranteed that it has not been purposely or unconsciously manipulated. Every effort has been made to obtain as much relevant data as possible for this evaluation. It is important to note that the conclusions that lead to this procedure are derived in large part from the available data. Always take into account that the treatment will also be dependent on availability of resources and existing treatment guidelines, considered by other Pain Management Practitioners as being common knowledge and practice, at the time of the intervention. For Medico-Legal purposes, it is also important to point out that variation in procedural techniques and pharmacological choices are the acceptable norm. The indications, contraindications, technique, and results of the above procedure should only be interpreted and judged by a Board-Certified Interventional Pain Specialist with extensive familiarity and expertise in the same exact procedure and technique.  Instructions provided at this appointment: Patient Instructions    ____________________________________________________________________________________________  Post-Procedure instructions Instructions:  Apply ice: Fill a plastic sandwich bag with crushed ice. Cover it with a small towel and apply to injection site. Apply for 15 minutes then remove x 15 minutes. Repeat sequence on day of procedure, until you go to bed. The purpose is to minimize swelling and discomfort after procedure.  Apply heat: Apply heat to procedure site starting the day following the procedure. The purpose is to treat any soreness and discomfort from the procedure.  Food intake: Start with clear liquids (like water) and advance to regular food, as tolerated.   Physical activities: Keep activities to a minimum for the first 8 hours after the procedure.   Driving: If you have received any sedation, you are not allowed to drive for 24 hours after your procedure.  Blood thinner: Restart your blood thinner 6 hours after your procedure. (Only for those taking blood thinners)  Insulin: As soon as you can eat, you may resume your normal dosing schedule. (Only for those taking insulin)  Infection prevention: Keep procedure site clean and dry.  Post-procedure Pain Diary: Extremely important that this be done correctly and accurately. Recorded information will be used to determine the next step in treatment.  Pain evaluated is that of treated area only. Do not include pain from an untreated area.  Complete every hour, on the hour, for the initial 8 hours. Set an alarm to help you do this part accurately.  Do not go to sleep and have it completed later. It will not be accurate.  Follow-up appointment: Keep your follow-up appointment after the procedure. Usually 2 weeks for most procedures. (6 weeks in the case of radiofrequency.) Bring you pain diary.  Expect:  From numbing medicine (AKA: Local Anesthetics): Numbness or decrease in pain.  Onset: Full effect within 15 minutes of  injected.  Duration: It will depend on the type of local anesthetic used. On the average, 1 to 8 hours.   From steroids: Decrease in swelling or inflammation. Once inflammation is improved, relief of the pain will follow.  Onset of benefits: Depends on the amount of swelling present. The more swelling, the longer it will take for the benefits to be seen. In some cases, up to 10 days.  Duration: Steroids will stay in the system x 2 weeks. Duration of  benefits will depend on multiple posibilities including persistent irritating factors.  From procedure: Some discomfort is to be expected once the numbing medicine wears off. This should be minimal if ice and heat are applied as instructed. Call if:  You experience numbness and weakness that gets worse with time, as opposed to wearing off.  New onset bowel or bladder incontinence. (Spinal procedures only)  Emergency Numbers:  Durning business hours (Monday - Thursday, 8:00 AM - 4:00 PM) (Friday, 9:00 AM - 12:00 Noon): [phone]  After hours: [phone] ____________________________________________________________________________________________  Pain Management Discharge Instructions  General Discharge Instructions :  If you need to reach your doctor call: Monday-Friday 8:00 am - 4:00 pm at [phone] or toll free [phone].  After clinic hours [phone] to have operator reach doctor.  Bring all of your medication bottles to all your appointments in the pain clinic.  To cancel or reschedule your appointment with Pain Management please remember to call 24 hours in advance to avoid a fee.  Refer to the educational materials which you have been given on: General Risks, I had my Procedure. Discharge Instructions, Post Sedation.  Post Procedure Instructions:  The drugs you were given will stay in your system until tomorrow, so for the next 24 hours you should not drive, make any legal decisions or drink any alcoholic  beverages.  You may eat anything you prefer, but it is better to start with liquids then soups and crackers, and gradually work up to solid foods.  Please notify your doctor immediately if you have any unusual bleeding, trouble breathing or pain that is not related to your normal pain.  Depending on the type of procedure that was done, some parts of your body may feel week and/or numb.  This usually clears up by tonight or the next day.  Walk with the use of an assistive device or accompanied by an adult for the 24 hours.  You may use ice on the affected area for the first 24 hours.  Put ice in a Ziploc bag and cover with a towel and place against area 15 minutes on 15 minutes off.  You may switch to heat after 24 hours.Sacroiliac (SI) Joint Injection Patient Information  Description: The sacroiliac joint connects the scrum (very low back and tailbone) to the ilium (a pelvic bone which also forms half of the hip joint).  Normally this joint experiences very little motion.  When this joint becomes inflamed or unstable low back and or hip and pelvis pain may result.  Injection of this joint with local anesthetics (numbing medicines) and steroids can provide diagnostic information and reduce pain.  This injection is performed with the aid of x-ray guidance into the tailbone area while you are lying on your stomach.   You may experience an electrical sensation down the leg while this is being done.  You may also experience numbness.  We also may ask if we are reproducing your normal pain during the injection.  Conditions which may be treated SI injection:   Low back, buttock, hip or leg pain  Preparation for the Injection:  1. Do not eat any solid food or dairy products within 8 hours of your appointment.  2. You may drink clear liquids up to 3 hours before appointment.  Clear liquids include water, black coffee, juice or soda.  No milk or cream please. 3. You may take your regular medications,  including pain medications with a sip of water before your appointment.  Diabetics  should hold regular insulin (if take separately) and take 1/2 normal NPH dose the morning of the procedure.  Carry some sugar containing items with you to your appointment. 4. A driver must accompany you and be prepared to drive you home after your procedure. 5. Bring all of your current medications with you. 6. An IV may be inserted and sedation may be given at the discretion of the physician. 7. A blood pressure cuff, EKG and other monitors will often be applied during the procedure.  Some patients may need to have extra oxygen administered for a short period.  8. You will be asked to provide medical information, including your allergies, prior to the procedure.  We must know immediately if you are taking blood thinners (like Coumadin/Warfarin) or if you are allergic to IV iodine contrast (dye).  We must know if you could possible be pregnant.  Possible side effects:   Bleeding from needle site  Infection (rare, may require surgery)  Nerve injury (rare)  Numbness & tingling (temporary)  A brief convulsion or seizure  Light-headedness (temporary)  Pain at injection site (several days)  Decreased blood pressure (temporary)  Weakness in the leg (temporary)   Call if you experience:   New onset weakness or numbness of an extremity below the injection site that last more than 8 hours.  Hives or difficulty breathing ( go to the emergency room)  Inflammation or drainage at the injection site  Any new symptoms which are concerning to you  Please note:  Although the local anesthetic injected can often make your back/ hip/ buttock/ leg feel good for several hours after the injections, the pain will likely return.  It takes 3-7 days for steroids to work in the sacroiliac area.  You may not notice any pain relief for at least that one week.  If effective, we will often do a series of three injections  spaced 3-6 weeks apart to maximally decrease your pain.  After the initial series, we generally will wait some months before a repeat injection of the same type.  If you have any questions, please call [phone] [facility]  Pain Management Discharge Instructions  General Discharge Instructions :  If you need to reach your doctor call: Monday-Friday 8:00 am - 4:00 pm at [phone] or toll free [phone].  After clinic hours [phone] to have operator reach doctor.  Bring all of your medication bottles to all your appointments in the pain clinic.  To cancel or reschedule your appointment with Pain Management please remember to call 24 hours in advance to avoid a fee.  Refer to the educational materials which you have been given on: General Risks, I had my Procedure. Discharge Instructions, Post Sedation.  Post Procedure Instructions:  The drugs you were given will stay in your system until tomorrow, so for the next 24 hours you should not drive, make any legal decisions or drink any alcoholic beverages.  You may eat anything you prefer, but it is better to start with liquids then soups and crackers, and gradually work up to solid foods.  Please notify your doctor immediately if you have any unusual bleeding, trouble breathing or pain that is not related to your normal pain.  Depending on the type of procedure that was done, some parts of your body may feel week and/or numb.  This usually clears up by tonight or the next day.  Walk with the use of an assistive device or accompanied by  an adult for the 24 hours.  You may use ice on the affected area for the first 24 hours.  Put ice in a Ziploc bag and cover with a towel and place against area 15 minutes on 15 minutes off.  You may switch to heat after 24 hours.GENERAL RISKS AND COMPLICATIONS  What are the risk, side effects and possible complications? Generally speaking, most procedures are  safe.  However, with any procedure there are risks, side effects, and the possibility of complications.  The risks and complications are dependent upon the sites that are lesioned, or the type of nerve block to be performed.  The closer the procedure is to the spine, the more serious the risks are.  Great care is taken when placing the radio frequency needles, block needles or lesioning probes, but sometimes complications can occur. 1. Infection: Any time there is an injection through the skin, there is a risk of infection.  This is why sterile conditions are used for these blocks.  There are four possible types of infection. 1. Localized skin infection. 2. Central Nervous System Infection-This can be in the form of Meningitis, which can be deadly. 3. Epidural Infections-This can be in the form of an epidural abscess, which can cause pressure inside of the spine, causing compression of the spinal cord with subsequent paralysis. This would require an emergency surgery to decompress, and there are no guarantees that the patient would recover from the paralysis. 4. Discitis-This is an infection of the intervertebral discs.  It occurs in about 1% of discography procedures.  It is difficult to treat and it may lead to surgery.        2. Pain: the needles have to go through skin and soft tissues, will cause soreness.       3. Damage to internal structures:  The nerves to be lesioned may be near blood vessels or    other nerves which can be potentially damaged.       4. Bleeding: Bleeding is more common if the patient is taking blood thinners such as  aspirin, Coumadin, Ticiid, Plavix, etc., or if he/she have some genetic predisposition  such as hemophilia. Bleeding into the spinal canal can cause compression of the spinal  cord with subsequent paralysis.  This would require an emergency surgery to  decompress and there are no guarantees that the patient would recover from the  paralysis.       5. Pneumothorax:   Puncturing of a lung is a possibility, every time a needle is introduced in  the area of the chest or upper back.  Pneumothorax refers to free air around the  collapsed lung(s), inside of the thoracic cavity (chest cavity).  Another two possible  complications related to a similar event would include: Hemothorax and Chylothorax.   These are variations of the Pneumothorax, where instead of air around the collapsed  lung(s), you may have blood or chyle, respectively.       6. Spinal headaches: They may occur with any procedures in the area of the spine.       7. Persistent CSF (Cerebro-Spinal Fluid) leakage: This is a rare problem, but may occur  with prolonged intrathecal or epidural catheters either due to the formation of a fistulous  track or a dural tear.       8. Nerve damage: By working so close to the spinal cord, there is always a possibility of  nerve damage, which could be as serious as a permanent  spinal cord injury with  paralysis.       9. Death:  Although rare, severe deadly allergic reactions known as "Anaphylactic  reaction" can occur to any of the medications used.      10. Worsening of the symptoms:  We can always make thing worse.  What are the chances of something like this happening? Chances of any of this occuring are extremely low.  By statistics, you have more of a chance of getting killed in a motor vehicle accident: while driving to the hospital than any of the above occurring .  Nevertheless, you should be aware that they are possibilities.  In general, it is similar to taking a shower.  Everybody knows that you can slip, hit your head and get killed.  Does that mean that you should not shower again?  Nevertheless always keep in mind that statistics do not mean anything if you happen to be on the wrong side of them.  Even if a procedure has a 1 (one) in a 1,000,000 (million) chance of going wrong, it you happen to be that one..Also, keep in mind that by statistics, you have more of  a chance of having something go wrong when taking medications.  Who should not have this procedure? If you are on a blood thinning medication (e.g. Coumadin, Plavix, see list of "Blood Thinners"), or if you have an active infection going on, you should not have the procedure.  If you are taking any blood thinners, please inform your physician.  How should I prepare for this procedure?  Do not eat or drink anything at least six hours prior to the procedure.  Bring a driver with you .  It cannot be a taxi.  Come accompanied by an adult that can drive you back, and that is strong enough to help you if your legs get weak or numb from the local anesthetic.  Take all of your medicines the morning of the procedure with just enough water to swallow them.  If you have diabetes, make sure that you are scheduled to have your procedure done first thing in the morning, whenever possible.  If you have diabetes, take only half of your insulin dose and notify our nurse that you have done so as soon as you arrive at the clinic.  If you are diabetic, but only take blood sugar pills (oral hypoglycemic), then do not take them on the morning of your procedure.  You may take them after you have had the procedure.  Do not take aspirin or any aspirin-containing medications, at least eleven (11) days prior to the procedure.  They may prolong bleeding.  Wear loose fitting clothing that may be easy to take off and that you would not mind if it got stained with Betadine or blood.  Do not wear any jewelry or perfume  Remove any nail coloring.  It will interfere with some of our monitoring equipment.  NOTE: Remember that this is not meant to be interpreted as a complete list of all possible complications.  Unforeseen problems may occur.  BLOOD THINNERS The following drugs contain aspirin or other products, which can cause increased bleeding during surgery and should not be taken for 2 weeks prior to and 1 week  after surgery.  If you should need take something for relief of minor pain, you may take acetaminophen which is found in Tylenol,m Datril, Anacin-3 and Panadol. It is not blood thinner. The products listed below are.  Do not take  any of the products listed below in addition to any listed on your instruction sheet.  A.P.C or A.P.C with Codeine Codeine Phosphate Capsules #3 Ibuprofen Ridaura  ABC compound Congesprin Imuran rimadil  Advil Cope Indocin Robaxisal  Alka-Seltzer Effervescent Pain Reliever and Antacid Coricidin or Coricidin-D  Indomethacin Rufen  Alka-Seltzer plus Cold Medicine Cosprin Ketoprofen S-A-C Tablets  Anacin Analgesic Tablets or Capsules Coumadin Korlgesic Salflex  Anacin Extra Strength Analgesic tablets or capsules CP-2 Tablets Lanoril Salicylate  Anaprox Cuprimine Capsules Levenox Salocol  Anexsia-D Dalteparin Magan Salsalate  Anodynos Darvon compound Magnesium Salicylate Sine-off  Ansaid Dasin Capsules Magsal Sodium Salicylate  Anturane Depen Capsules Marnal Soma  APF Arthritis pain formula Dewitt's Pills Measurin Stanback  Argesic Dia-Gesic Meclofenamic Sulfinpyrazone  Arthritis Bayer Timed Release Aspirin Diclofenac Meclomen Sulindac  Arthritis pain formula Anacin Dicumarol Medipren Supac  Analgesic (Safety coated) Arthralgen Diffunasal Mefanamic Suprofen  Arthritis Strength Bufferin Dihydrocodeine Mepro Compound Suprol  Arthropan liquid Dopirydamole Methcarbomol with Aspirin Synalgos  ASA tablets/Enseals Disalcid Micrainin Tagament  Ascriptin Doan's Midol Talwin  Ascriptin A/D Dolene Mobidin Tanderil  Ascriptin Extra Strength Dolobid Moblgesic Ticlid  Ascriptin with Codeine Doloprin or Doloprin with Codeine Momentum Tolectin  Asperbuf Duoprin Mono-gesic Trendar  Aspergum Duradyne Motrin or Motrin IB Triminicin  Aspirin plain, buffered or enteric coated Durasal Myochrisine Trigesic  Aspirin Suppositories Easprin Nalfon Trillsate  Aspirin with Codeine Ecotrin  Regular or Extra Strength Naprosyn Uracel  Atromid-S Efficin Naproxen Ursinus  Auranofin Capsules Elmiron Neocylate Vanquish  Axotal Emagrin Norgesic Verin  Azathioprine Empirin or Empirin with Codeine Normiflo Vitamin E  Azolid Emprazil Nuprin Voltaren  Bayer Aspirin plain, buffered or children's or timed BC Tablets or powders Encaprin Orgaran Warfarin Sodium  Buff-a-Comp Enoxaparin Orudis Zorpin  Buff-a-Comp with Codeine Equegesic Os-Cal-Gesic   Buffaprin Excedrin plain, buffered or Extra Strength Oxalid   Bufferin Arthritis Strength Feldene Oxphenbutazone   Bufferin plain or Extra Strength Feldene Capsules Oxycodone with Aspirin   Bufferin with Codeine Fenoprofen Fenoprofen Pabalate or Pabalate-SF   Buffets II Flogesic Panagesic   Buffinol plain or Extra Strength Florinal or Florinal with Codeine Panwarfarin   Buf-Tabs Flurbiprofen Penicillamine   Butalbital Compound Four-way cold tablets Penicillin   Butazolidin Fragmin Pepto-Bismol   Carbenicillin Geminisyn Percodan   Carna Arthritis Reliever Geopen Persantine   Carprofen Gold's salt Persistin   Chloramphenicol Goody's Phenylbutazone   Chloromycetin Haltrain Piroxlcam   Clmetidine heparin Plaquenil   Cllnoril Hyco-pap Ponstel   Clofibrate Hydroxy chloroquine Propoxyphen         Before stopping any of these medications, be sure to consult the physician who ordered them.  Some, such as Coumadin (Warfarin) are ordered to prevent or treat serious conditions such as "deep thrombosis", "pumonary embolisms", and other heart problems.  The amount of time that you may need off of the medication may also vary with the medication and the reason for which you were taking it.  If you are taking any of these medications, please make sure you notify your pain physician before you undergo any procedures.          Facet Joint Block, Care After Refer to this sheet in the next few weeks. These instructions provide you with information about  caring for yourself after your procedure. Your health care provider may also give you more specific instructions. Your treatment has been planned according to current medical practices, but problems sometimes occur. Call your health care provider if you have any problems or questions after your procedure. What can I expect  after the procedure? After the procedure, it is common to have:  Some tenderness over the injection sites for 2 days after the procedure.  A temporary increase in blood sugar if you have diabetes.  Follow these instructions at home:  Keep track of the amount of pain relief you feel and how long it lasts.  Take over-the-counter and prescription medicines only as told by your health care provider. You may need to limit pain medicine within the first 4-6 hours after the procedure.  Remove your bandages (dressings) the morning after the procedure.  For the first 24 hours after the procedure: ? Do not apply heat near or over the injection sites. ? Do not take a bath or soak in water, such as in a pool or lake. ? Do not drive or operate heavy machinery unless approved by your health care provider. ? Avoid activities that require a lot of energy.  If the injection site is tender, try applying ice to the area. To do this: ? Put ice in a plastic bag. ? Place a towel between your skin and the bag. ? Leave the ice on for 20 minutes, 2-3 times a day.  Keep all follow-up visits as told by your health care provider. This is important. Contact a health care provider if:  Fluid is coming from an injection site.  There is significant bleeding or swelling at an injection site.  You have diabetes and your blood sugar is above 180 mg/dL. Get help right away if:  You have a fever.  You have worsening pain or swelling around an injection site.  There are red streaks around an injection site.  You develop severe pain that is not controlled by your medicines.  You develop a  headache, stiff neck, nausea, or vomiting.  Your eyes become very sensitive to light.  You have weakness, paralysis, or tingling in your arms or legs that was not present before the procedure.  You have difficulty urinating or breathing. This information is not intended to replace advice given to you by your health care provider. Make sure you discuss any questions you have with your health care provider. Document Released: 02/12/2012 Document Revised: 07/12/2015 Document Reviewed: 11/21/2014 Elsevier Interactive Patient Education  2018 [employer].  Facet Joint Block The facet joints connect the bones of the spine (vertebrae). They make it possible for you to bend, twist, and make other movements with your spine. They also keep you from bending too far, twisting too far, and making other excessive movements. A facet joint block is a procedure where a numbing medicine (anesthetic) is injected into a facet joint. Often, a type of anti-inflammatory medicine called a steroid is also injected. A facet joint block may be done to diagnose neck or back pain. If the pain gets better after a facet joint block, it means the pain is probably coming from the facet joint. If the pain does not get better, it means the pain is probably not coming from the facet joint. A facet joint block may also be done to relieve neck or back pain caused by an inflamed facet joint. A facet joint block is only done to relieve pain if the pain does not improve with other methods, such as medicine, exercise programs, and physical therapy. Tell a health care provider about: Any allergies you have. All medicines you are taking, including vitamins, herbs, eye drops, creams, and over-the-counter medicines. Any problems you or family members have had with anesthetic medicines. Any blood  disorders you have. Any surgeries you have had. Any medical conditions you have. Whether you are pregnant or may be pregnant. What are the  risks? Generally, this is a safe procedure. However, problems may occur, including: Bleeding. Injury to a nerve near the injection site. Pain at the injection site. Weakness or numbness in areas controlled by nerves near the injection site. Infection. Temporary fluid retention. Allergic reactions to medicines or dyes. Injury to other structures or organs near the injection site.  What happens before the procedure? Follow instructions from your health care provider about eating or drinking restrictions. Ask your health care provider about: Changing or stopping your regular medicines. This is especially important if you are taking diabetes medicines or blood thinners. Taking medicines such as aspirin and ibuprofen. These medicines can thin your blood. Do not take these medicines before your procedure if your health care provider instructs you not to. Do not take any new dietary supplements or medicines without asking your health care provider first. Plan to have someone take you home after the procedure. What happens during the procedure? You may need to remove your clothing and dress in an open-back gown. The procedure will be done while you are lying on an X-ray table. You will most likely be asked to lie on your stomach, but you may be asked to lie in a different position if an injection will be made in your neck. Machines will be used to monitor your oxygen levels, heart rate, and blood pressure. If an injection will be made in your neck, an IV tube will be inserted into one of your veins. Fluids and medicine will flow directly into your body through the IV tube. The area over the facet joint where the injection will be made will be cleaned with soap. The surrounding skin will be covered with clean drapes. A numbing medicine (local anesthetic) will be applied to your skin. Your skin may sting or burn for a moment. A video X-ray machine (fluoroscopy) will be used to locate the joint. In  some cases, a CT scan may be used. A contrast dye may be injected into the facet joint area to help locate the joint. When the joint is located, an anesthetic will be injected into the joint through the needle. Your health care provider will ask you whether you feel pain relief. If you do feel relief, a steroid may be injected to provide pain relief for a longer period of time. If you do not feel relief or feel only partial relief, additional injections of an anesthetic may be made in other facet joints. The needle will be removed. Your skin will be cleaned. A bandage (dressing) will be applied over each injection site. The procedure may vary among health care providers and hospitals. What happens after the procedure? You will be observed for 15-30 minutes before being allowed to go home. This information is not intended to replace advice given to you by your health care provider. Make sure you discuss any questions you have with your health care provider. Document Released: 07/17/2006 Document Revised: 03/29/2015 Document Reviewed: 11/21/2014 Elsevier Interactive Patient Education  [employer].

## 2016-08-27 ENCOUNTER — Telehealth: Payer: Self-pay | Admitting: *Deleted

## 2016-08-27 NOTE — Telephone Encounter (Signed)
Mother states she will get him to call us for any issues post procedure. patient still asleep.

## 2016-09-02 ENCOUNTER — Encounter (HOSPITAL_COMMUNITY): Payer: Self-pay | Admitting: Psychiatry

## 2016-09-02 ENCOUNTER — Ambulatory Visit (INDEPENDENT_AMBULATORY_CARE_PROVIDER_SITE_OTHER): Payer: Self-pay | Admitting: Psychiatry

## 2016-09-02 VITALS — BP 126/74 | HR 58 | Ht 76.0 in | Wt 208.6 lb

## 2016-09-02 DIAGNOSIS — F3341 Major depressive disorder, recurrent, in partial remission: Secondary | ICD-10-CM

## 2016-09-02 DIAGNOSIS — Z811 Family history of alcohol abuse and dependence: Secondary | ICD-10-CM

## 2016-09-02 DIAGNOSIS — G8929 Other chronic pain: Secondary | ICD-10-CM

## 2016-09-02 DIAGNOSIS — F1921 Other psychoactive substance dependence, in remission: Secondary | ICD-10-CM

## 2016-09-02 DIAGNOSIS — F17210 Nicotine dependence, cigarettes, uncomplicated: Secondary | ICD-10-CM

## 2016-09-02 MED ORDER — MIRTAZAPINE 15 MG PO TABS: 15.0000 mg | ORAL_TABLET | Freq: Every day | ORAL | 2 refills | Status: DC

## 2016-09-02 MED FILL — ?MIRTAZAPINE 15 MG TABLET: 15 | 30 days supply | Qty: 30 | Fill #0

## 2016-09-02 NOTE — Progress Notes (Signed)
BH MD/PA/NP OP Progress Note  09/02/2016 3:29 PM Mark [name]  MRN:  [id number]  Chief Complaint: med management  Subjective:  Mark [name] presents for medication management follow-up anxiety and mood symptom. Much of his mood symptoms are related to chronic pain in his legs and feet. He reports that he is followed with a pain management clinic. He has tried a multitude of therapies. Spent time listening to him read some of his struggles in his pain, and spent time empathizing with him this.  Discussed the use of Remeron at night for sleep and depression. He reports that propanolol was not particularly helpful, so he discontinued this after couple days.  He agrees to schedule follow-up with Amada Jupiter for individual therapy, and will come back to see this Probation officer in 2 months. No acute safety issues or suicidality. He reports that he continues to be abstinent from alcohol.  Visit Diagnosis:    ICD-10-CM   1. Recurrent major depressive disorder, in partial remission ([name]) F33.41 mirtazapine (REMERON) 15 MG tablet    Past Psychiatric History: See intake H&P for full details. Reviewed, with no updates at this time.  Past Medical History:  Past Medical History:  Diagnosis Date  . Alcohol withdrawal ([place])   . Allergy   . Anxiety Dx 2014  . COPD (chronic obstructive pulmonary disease) ([place])   . Drug abuse    pt reports opioid dependence due to previous back surgeries  . Enlarged prostate   . ETOH abuse   . GERD (gastroesophageal reflux disease) Dx 2003  . Headache(784.0)   . Hepatitis   . Hyperlipidemia Dx 2000  . Hypertension Dx 2011  . IBS (irritable bowel syndrome)    alternates constiaption/ diarrhea  . Irregular heart beat   . 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 Mental disorder   . Neuromuscular disorder ([place])    hands hurt   . Neuropathy 06/07/2013  . Seizure due to alcohol withdrawal ([place]) 01/23/2014   Per patient report  . Shortness of breath   . Withdrawal seizures ([place])  2014   last seizure 2014 etoh with drawal    Past Surgical History:  Procedure Laterality Date  . [place] STUDY N/A 01/16/2015   Procedure: [place] STUDY;  Surgeon: Manus Gunning, MD;  Location: [facility];  Service: Gastroenterology;  Laterality: N/A;  . [place]  . COLONOSCOPY  1998  . LEFT HEART CATHETERIZATION WITH CORONARY ANGIOGRAM N/A 01/13/2014   Procedure: LEFT HEART CATHETERIZATION WITH CORONARY ANGIOGRAM;  Surgeon: Clent Demark, MD;  Location: [facility];  Service: Cardiovascular;  Laterality: N/A;  . NASAL SINUS SURGERY    . NECK SURGERY  2000   cervial fusion   . [place]ISSEN FUNDOPLICATION    . SUBACROMIAL DECOMPRESSION Right 11/16/2014   Procedure: RIGHT SHOULDER ARTHROSCOPY WITH SUBACROMIAL DECOMPRESSION ;  Surgeon: Leandrew Koyanagi, MD;  Location: [facility];  Service: Orthopedics;  Laterality: Right;  . UPPER GASTROINTESTINAL ENDOSCOPY      Family Psychiatric History: See intake H&P for full details. Reviewed, with no updates at this time.   Family History:  Family History  Problem Relation Age of Onset  . Lung cancer Father        was a smoker  . Alcohol abuse Father   . Hypertension Mother   . Colon polyps Mother   . Breast cancer Paternal Grandmother   . Alcohol abuse Paternal Grandfather   . Colon cancer Neg  Hx   . Rectal cancer Neg Hx   . Stomach cancer Neg Hx     Social History:  Social History   Social History  . Marital status: Divorced    Spouse name: N/A  . Number of children: 2  . Years of education: N/A   Occupational History  . [employer]     Social History Main Topics  . Smoking status: Current Every Day Smoker    Packs/day: 0.50    Years: 30.00    Types: Cigarettes  . Smokeless tobacco: Never Used     Comment: 11-30-2014 info given, Has cut back from 2 pks to 1/2 pack and "working on it"  . Alcohol use No     Comment: occ; binge drinking occasionally  . Drug use: No     Comment: [place]  .  Sexual activity: Not Currently   Other Topics Concern  . None   Social History Narrative  . None    Allergies:  Allergies  Allergen Reactions  . Cozaar [Losartan] Anaphylaxis  . Lisinopril Anaphylaxis    Metabolic Disorder Labs: Lab Results  Component Value Date   HGBA1C 6.0 04/03/2015   MPG 128 (H) 01/22/2014   No results found for: PROLACTIN No results found for: CHOL, TRIG, HDL, CHOLHDL, VLDL, LDLCALC   Current Medications: Current Outpatient Prescriptions  Medication Sig Dispense Refill  . cyclobenzaprine (FLEXERIL) 10 MG tablet Take 1 tablet (10 mg total) by mouth at bedtime. 30 tablet 2  . gabapentin (NEURONTIN) 800 MG tablet Take 1 tablet (800 mg total) by mouth 4 (four) times daily. 360 tablet 1  . metoprolol (LOPRESSOR) 50 MG tablet Take 1 tablet (50 mg total) by mouth 2 (two) times daily. 60 tablet 11  . traMADol (ULTRAM) 50 MG tablet Take 2 tablets (100 mg total) by mouth 4 (four) times daily. 1-2 tablets 240 tablet 2  . traZODone (DESYREL) 100 MG tablet Take 1 tablet (100 mg total) by mouth at bedtime as needed. 90 tablet 1  . VENTOLIN HFA 108 (90 Base) MCG/ACT inhaler INHALE 2 PUFFS INTO THE LUNGS EVERY 4 HOURS AS NEEDED FOR WHEEZING OR SHORTNESS OF BREATH. 18 g 0  . baclofen (LIORESAL) 10 MG tablet Take 1 tablet (10 mg total) by mouth 3 (three) times daily. (Patient not taking: Reported on 09/02/2016) 90 tablet 2  . gemfibrozil (LOPID) 600 MG tablet Take 600 mg by mouth 2 (two) times daily before a meal.    . losartan (COZAAR) 50 MG tablet Take 50 mg by mouth daily.    . mirtazapine (REMERON) 15 MG tablet Take 1 tablet (15 mg total) by mouth at bedtime. 30 tablet 2   No current facility-administered medications for this visit.     Neurologic: Headache: Negative Seizure: Negative Paresthesias: Yes  Musculoskeletal: Strength & Muscle Tone: within normal limits Gait & Station: normal Patient leans: N/A  Psychiatric Specialty Exam: ROS  Blood pressure  126/74, pulse (!) 58, height 6\' 4"  (1.93 m), weight 208 lb 9.6 oz (94.6 kg).Body mass index is 25.39 kg/m.  General Appearance: Casual and Fairly Groomed  Eye Contact:  Good  Speech:  Clear and Coherent  Volume:  Normal  Mood:  Anxious  Affect:  Congruent  Thought Process:  Coherent and Goal Directed  Orientation:  Full (Time, Place, and Person)  Thought Content: Logical   Suicidal Thoughts:  No  Homicidal Thoughts:  No  Memory:  Immediate;   Fair  Judgement:  Fair  Insight:  Fair  Psychomotor Activity:  Normal  Concentration:  Concentration: Fair  Recall:  Good  Fund of Knowledge: Good  Language: Good  Akathisia:  Negative  Handed:  Right  AIMS (if indicated):  0  Assets:  Communication Skills Desire for Improvement Financial Resources/Insurance Housing Transportation  ADL's:  Intact  Cognition: WNL  Sleep:  4-5 hours    Treatment Plan Summary: TYKWON FERA is a 51 year old male with a history of depression, in addition to polysubstance use disorder in remission, chronic pain due to multiple medical issues, cardiomyopathy, who presents today for medication management follow-up. At our last visit, we started propanolol, which he tried for about 2-3 days, and reports that it was not helpful.  We agreed to initiate Remeron 15 mg at night for sleep and mood symptoms, and follow-up in 2 months.  No acute safety issues at this time.  1. Recurrent major depressive disorder, in partial remission ([place])    Continue in individual therapy with Frankie Follow-up in 2 months with writer Propanolol discontinued Initiate Remeron 15 mg nightly Trazodone 100 mg nightly Continue gabapentin as prescribed  Aundra Dubin, MD 09/02/2016, 3:29 PM

## 2016-09-04 ENCOUNTER — Ambulatory Visit (HOSPITAL_COMMUNITY): Payer: Self-pay | Admitting: Clinical

## 2016-09-05 ENCOUNTER — Other Ambulatory Visit: Payer: Self-pay | Admitting: Family Medicine

## 2016-09-05 DIAGNOSIS — R062 Wheezing: Secondary | ICD-10-CM

## 2016-09-05 MED FILL — METOPROLOL TARTRATE 50 MG T: 50 | 30 days supply | Qty: 90 | Fill #3

## 2016-09-05 MED FILL — GABAPENTIN 800 MG TABLET: 800 | 30 days supply | Qty: 120 | Fill #2

## 2016-09-05 MED FILL — VENTOLIN HFA 90 MCG INHALER: 108 (90 BAS | 16 days supply | Qty: 18 | Fill #0

## 2016-09-16 ENCOUNTER — Ambulatory Visit: Payer: Self-pay | Attending: Nurse Practitioner | Admitting: Nurse Practitioner

## 2016-09-16 ENCOUNTER — Encounter: Payer: Self-pay | Admitting: Nurse Practitioner

## 2016-09-16 VITALS — BP 134/95 | HR 70 | Temp 98.3°F | Resp 18 | Ht 76.0 in | Wt 215.0 lb

## 2016-09-16 DIAGNOSIS — G894 Chronic pain syndrome: Secondary | ICD-10-CM | POA: Insufficient documentation

## 2016-09-16 DIAGNOSIS — E559 Vitamin D deficiency, unspecified: Secondary | ICD-10-CM | POA: Insufficient documentation

## 2016-09-16 DIAGNOSIS — M5441 Lumbago with sciatica, right side: Secondary | ICD-10-CM

## 2016-09-16 DIAGNOSIS — G629 Polyneuropathy, unspecified: Secondary | ICD-10-CM | POA: Insufficient documentation

## 2016-09-16 DIAGNOSIS — F17210 Nicotine dependence, cigarettes, uncomplicated: Secondary | ICD-10-CM | POA: Insufficient documentation

## 2016-09-16 DIAGNOSIS — M545 Low back pain: Secondary | ICD-10-CM | POA: Insufficient documentation

## 2016-09-16 DIAGNOSIS — Z79891 Long term (current) use of opiate analgesic: Secondary | ICD-10-CM | POA: Insufficient documentation

## 2016-09-16 DIAGNOSIS — M7918 Myalgia, other site: Secondary | ICD-10-CM

## 2016-09-16 DIAGNOSIS — R51 Headache: Secondary | ICD-10-CM | POA: Insufficient documentation

## 2016-09-16 DIAGNOSIS — I426 Alcoholic cardiomyopathy: Secondary | ICD-10-CM | POA: Insufficient documentation

## 2016-09-16 DIAGNOSIS — E876 Hypokalemia: Secondary | ICD-10-CM | POA: Insufficient documentation

## 2016-09-16 DIAGNOSIS — R5382 Chronic fatigue, unspecified: Secondary | ICD-10-CM | POA: Insufficient documentation

## 2016-09-16 DIAGNOSIS — M79604 Pain in right leg: Secondary | ICD-10-CM | POA: Insufficient documentation

## 2016-09-16 DIAGNOSIS — F331 Major depressive disorder, recurrent, moderate: Secondary | ICD-10-CM | POA: Insufficient documentation

## 2016-09-16 DIAGNOSIS — Q761 Klippel-Feil syndrome: Secondary | ICD-10-CM | POA: Insufficient documentation

## 2016-09-16 DIAGNOSIS — G709 Myoneural disorder, unspecified: Secondary | ICD-10-CM | POA: Insufficient documentation

## 2016-09-16 DIAGNOSIS — M5442 Lumbago with sciatica, left side: Secondary | ICD-10-CM

## 2016-09-16 DIAGNOSIS — M79671 Pain in right foot: Secondary | ICD-10-CM | POA: Insufficient documentation

## 2016-09-16 DIAGNOSIS — M961 Postlaminectomy syndrome, not elsewhere classified: Secondary | ICD-10-CM

## 2016-09-16 DIAGNOSIS — M25511 Pain in right shoulder: Secondary | ICD-10-CM | POA: Insufficient documentation

## 2016-09-16 DIAGNOSIS — M533 Sacrococcygeal disorders, not elsewhere classified: Secondary | ICD-10-CM | POA: Insufficient documentation

## 2016-09-16 DIAGNOSIS — J320 Chronic maxillary sinusitis: Secondary | ICD-10-CM | POA: Insufficient documentation

## 2016-09-16 DIAGNOSIS — T402X5A Adverse effect of other opioids, initial encounter: Secondary | ICD-10-CM | POA: Insufficient documentation

## 2016-09-16 DIAGNOSIS — M79605 Pain in left leg: Secondary | ICD-10-CM | POA: Insufficient documentation

## 2016-09-16 DIAGNOSIS — N529 Male erectile dysfunction, unspecified: Secondary | ICD-10-CM | POA: Insufficient documentation

## 2016-09-16 DIAGNOSIS — F10229 Alcohol dependence with intoxication, unspecified: Secondary | ICD-10-CM | POA: Insufficient documentation

## 2016-09-16 DIAGNOSIS — G8929 Other chronic pain: Secondary | ICD-10-CM

## 2016-09-16 DIAGNOSIS — I10 Essential (primary) hypertension: Secondary | ICD-10-CM | POA: Insufficient documentation

## 2016-09-16 DIAGNOSIS — F411 Generalized anxiety disorder: Secondary | ICD-10-CM | POA: Insufficient documentation

## 2016-09-16 DIAGNOSIS — M791 Myalgia: Secondary | ICD-10-CM | POA: Insufficient documentation

## 2016-09-16 DIAGNOSIS — I472 Ventricular tachycardia: Secondary | ICD-10-CM | POA: Insufficient documentation

## 2016-09-16 DIAGNOSIS — M79672 Pain in left foot: Secondary | ICD-10-CM | POA: Insufficient documentation

## 2016-09-16 DIAGNOSIS — R45851 Suicidal ideations: Secondary | ICD-10-CM | POA: Insufficient documentation

## 2016-09-16 DIAGNOSIS — Z981 Arthrodesis status: Secondary | ICD-10-CM | POA: Insufficient documentation

## 2016-09-16 DIAGNOSIS — F1120 Opioid dependence, uncomplicated: Secondary | ICD-10-CM | POA: Insufficient documentation

## 2016-09-16 DIAGNOSIS — M542 Cervicalgia: Secondary | ICD-10-CM | POA: Insufficient documentation

## 2016-09-16 DIAGNOSIS — K219 Gastro-esophageal reflux disease without esophagitis: Secondary | ICD-10-CM | POA: Insufficient documentation

## 2016-09-16 DIAGNOSIS — E785 Hyperlipidemia, unspecified: Secondary | ICD-10-CM | POA: Insufficient documentation

## 2016-09-16 MED ORDER — CYCLOBENZAPRINE HCL 10 MG PO TABS: 10.0000 mg | ORAL_TABLET | Freq: Every day | ORAL | 2 refills | Status: DC

## 2016-09-16 MED ORDER — BACLOFEN 10 MG PO TABS: 10.0000 mg | ORAL_TABLET | Freq: Three times a day (TID) | ORAL | 2 refills | Status: DC

## 2016-09-16 MED ORDER — TRAMADOL HCL 50 MG PO TABS: 100.0000 mg | ORAL_TABLET | Freq: Four times a day (QID) | ORAL | 2 refills | Status: DC

## 2016-09-16 MED FILL — BACLOFEN 10 MG TABLET: 10 | 30 days supply | Qty: 90 | Fill #0

## 2016-09-16 MED FILL — ?CYCLOBENZAPRINE 10 MG TABL: 10 | 30 days supply | Qty: 30 | Fill #0

## 2016-09-16 NOTE — Patient Instructions (Signed)

## 2016-09-16 NOTE — Progress Notes (Deleted)
Patient's Name: Mark [name]  MRN: [id number]  Referring Provider: Boykin Nearing, MD  DOB: May 25, 1965  PCP: Boykin Nearing, MD  DOS: 09/16/2016  Note by: Vevelyn Francois NP  Service setting: Ambulatory outpatient  Specialty: Interventional Pain Management  Location: [facility] (AMB) Pain Management Facility    Patient type: Established    Primary Reason(s) for Visit: Encounter for prescription drug management. (Level of risk: moderate)  CC: No chief complaint on file.  HPI  Mark [name] is a 51 y.o. year old, male patient, who comes today for a medication management evaluation. 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 ([place]); Alcohol intoxication ([place]); Chronic low back pain (Location of Secondary source of pain) (Bilateral) (R>L); Chronic pain syndrome; Paroxysmal VT ([place]); Opioid dependence ([place]); Long Q-T syndrome; Hypokalemia; Alcohol use disorder, severe, dependence ([place]); Bipolar depression ([place]); Neuropathy, Peripheral (Feet) ([name]) (Location of Primary Source of Pain) (Bilateral) (R>L); Chronic shoulder pain (Right); Chronic radicular low back pain (Location of Tertiary source of pain) (Right) (to calf); Closed fracture of 5th metacarpal; Chronic fatigue; Vitamin D insufficiency; Poor dentition; Chronic maxillary sinusitis; Tender prostate; Erectile dysfunction; Chronic anxiety; GERD (gastroesophageal reflux disease); Suicidal ideation; Bipolar affective disorder, currently depressed, moderate ([place]); Non-sustained ventricular tachycardia ([place]); GAD (generalized anxiety disorder); Alcohol use disorder, severe, in early remission ([place]); Long term current use of opiate analgesic; Long term prescription opiate use; History of fusion of cervical spine; Chronic neck pain (Bilateral)(R>L); Cervical fusion syndrome; Failed back surgical syndrome; Chronic knee pain  (Bilateral) (R>L); Major depressive  disorder, recurrent episode, moderate ([place]); Musculoskeletal pain; 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 His primarily concern today is the No chief complaint on file.  Pain Assessment: Location:     Radiating:   Onset:   Duration:   Quality:   Severity:  /10 (self-reported pain score)  Note: Reported level is compatible with observation.                   Effect on ADL:   Timing:   Modifying factors:    Mark [name] was last scheduled for an appointment on Visit date not found for medication management. During today's appointment we reviewed Mark [name] chronic pain status, as well as his outpatient medication regimen.  The patient  reports that he does not use drugs. His body mass index is unknown because there is no height or weight on file.  Further details on both, my assessment(s), as well as the proposed treatment plan, please see below.  Controlled Substance Pharmacotherapy Assessment REMS (Risk Evaluation and Mitigation Strategy)  Analgesic: *** MME/day: *** mg/day.  No notes on file Pharmacokinetics: Liberation and absorption (onset of action): WNL Distribution (time to peak effect): WNL Metabolism and excretion (duration of action): WNL         Pharmacodynamics: Desired effects: Analgesia: Mark [name] reports >50% benefit. Functional ability: Patient reports that medication allows him to accomplish basic ADLs Clinically meaningful improvement in function (CMIF): Sustained CMIF goals met Perceived effectiveness: Described as relatively effective, allowing for increase in activities of daily living (ADL) Undesirable effects: Side-effects or Adverse reactions: None reported Monitoring: [place]  PMP: Online review of the past 58-monthperiod conducted. Compliant with practice rules and regulations List of all UDS test(s) done:  Lab Results  Component Value Date   TOXASSSELUR FINAL 05/14/2016   SUMMARY FINAL 03/18/2016   Last UDS on record: ToxAssure Select 13  Date Value Ref Range Status  05/14/2016 FINAL  Final    Comment:    ==================================================================== TOXASSURE SELECT 13 (MW) ==================================================================== Test                             Result       Flag       Units Drug Present and Declared for Prescription Verification   Tramadol                       PRESENT      EXPECTED   O-Desmethyltramadol            PRESENT      EXPECTED   N-Desmethyltramadol            PRESENT      EXPECTED    Source of tramadol is a prescription medication.    O-desmethyltramadol and N-desmethyltramadol are expected    metabolites of tramadol. ==================================================================== Test                      Result    Flag   Units      Ref Range   Creatinine              187              mg/dL      >=20 ==================================================================== Declared Medications:  The flagging and interpretation on this report are based on the  following declared medications.  Unexpected results may arise from  inaccuracies in the declared medications.  **Note: The testing scope of this panel includes these medications:  Tramadol (Ultram)  **Note: The testing scope of this panel does not include following  reported medications:  Albuterol (Ventolin HFA)  Cyclobenzaprine (Flexeril)  Dicyclomine (Bentyl)  Gabapentin  Metoprolol (Lopressor)  Tadalafil (Cialis)  Trazodone ==================================================================== For clinical consultation, please call [phone]. ====================================================================     Summary  Date Value Ref Range Status  03/18/2016 FINAL  Final    Comment:    ==================================================================== TOXASSURE COMP DRUG ANALYSIS,UR ==================================================================== Test                             Result       Flag       Units Drug Present and Declared for Prescription Verification   Codeine                        >23810       EXPECTED   ng/mg creat   Morphine                       8910         EXPECTED   ng/mg creat   Norcodeine                     960          EXPECTED   ng/mg creat    Sources of codeine include scheduled prescription medications;    morphine is an expected metabolite of codeine. Other sources of    morphine include scheduled prescription medications or as a    metabolite of heroin.  Norcodeine is an expected metabolite of codeine.   Hydrocodone                    145          EXPECTED   ng/mg creat    Hydrocodone is a minor metabolite of codeine; concentration    rarely exceeds 15% of the codeine concentration when this is the    source.    Sources of hydrocodone include scheduled prescription    medications.   Tramadol                       PRESENT      EXPECTED   O-Desmethyltramadol            PRESENT      EXPECTED   N-Desmethyltramadol            PRESENT      EXPECTED    Source of tramadol is a prescription medication.    O-desmethyltramadol and N-desmethyltramadol are expected    metabolites of tramadol.   Gabapentin                     PRESENT      EXPECTED   Trazodone                      PRESENT      EXPECTED   Acetaminophen                  PRESENT      EXPECTED   Metoprolol                     PRESENT      EXPECTED Drug Absent but Declared for Prescription Verification   Cyclobenzaprine                Not Detected UNEXPECTED ==================================================================== Test                      Result    Flag   Units      Ref Range    Creatinine              42               mg/dL      >=20 ==================================================================== Declared Medications:  The flagging and interpretation on this report are based on the  following declared medications.  Unexpected results may arise from  inaccuracies in the declared medications.  **Note: The testing scope of this panel includes these medications:  Codeine (Tylenol 3)  Cyclobenzaprine (Flexeril)  Gabapentin (Neurontin)  Metoprolol (Lopressor)  Tramadol (Ultram)  Trazodone (Desyrel)  **Note: The testing scope of this panel does not include small to  moderate amounts of these reported medications:  Acetaminophen (Tylenol 3)  **Note: The testing scope of this panel does not include following  reported medications:  Dicyclomine (Bentyl)  Tadalafil (Cialis) ==================================================================== For clinical consultation, please call [phone]. ====================================================================    UDS interpretation: Compliant          Medication Assessment Form: Reviewed. Patient indicates being compliant with therapy Treatment compliance: Compliant Risk Assessment Profile: Aberrant behavior: See prior evaluations. None observed or detected today Comorbid factors increasing risk of overdose: See prior notes. No additional risks detected today Risk of substance use disorder (SUD): Low Opioid Risk Tool (ORT) Total Score:    Interpretation Table:  Score <3 = Low  Risk for SUD  Score between 4-7 = Moderate Risk for SUD  Score >8 = High Risk for Opioid Abuse   Risk Mitigation Strategies:  Patient Counseling: Covered Patient-Prescriber Agreement (PPA): Present and active  Notification to other healthcare providers: Done  Pharmacologic Plan: No change in therapy, at this time  Laboratory Chemistry  Inflammation Markers (CRP: Acute Phase) (ESR: Chronic Phase) Lab Results  Component  Value Date   CRP 0.9 03/18/2016   ESRSEDRATE 2 03/18/2016                 Renal Function Markers Lab Results  Component Value Date   BUN 5 (L) 05/15/2016   CREATININE 0.72 05/15/2016   GFRAA >60 05/15/2016   GFRNONAA >60 05/15/2016                 Hepatic Function Markers Lab Results  Component Value Date   AST 30 05/15/2016   ALT 40 05/15/2016   ALBUMIN 4.7 05/15/2016   ALKPHOS 87 05/15/2016   HCVAB NEGATIVE 06/14/2012                 Electrolytes Lab Results  Component Value Date   NA 138 05/15/2016   K 3.4 (L) 05/15/2016   CL 101 05/15/2016   CALCIUM 9.0 05/15/2016   MG 2.1 03/18/2016                 Neuropathy Markers Lab Results  Component Value Date   VITAMINB12 244 03/18/2016                 Bone Pathology Markers Lab Results  Component Value Date   ALKPHOS 87 05/15/2016   VD25OH 21 (L) 07/04/2014   25OHVITD1 35 03/18/2016   25OHVITD2 1.0 03/18/2016   25OHVITD3 34 03/18/2016   CALCIUM 9.0 05/15/2016   TESTOFREE 78.9 09/16/2014   TESTOSTERONE 365 09/16/2014                 Coagulation Parameters Lab Results  Component Value Date   INR 1.04 06/07/2013   LABPROT 13.4 06/07/2013   PLT 298 05/15/2016                 Cardiovascular Markers Lab Results  Component Value Date   BNP 23.9 02/27/2015   HGB 17.7 (H) 05/15/2016   HCT 49.2 05/15/2016                 Note: Lab results reviewed.  Recent Diagnostic Imaging Review  Dg C-arm 1-60 Min-no Report  Result Date: 08/26/2016 Fluoroscopy was utilized by the requesting physician.  No radiographic interpretation.   Note: Imaging results reviewed.          Meds   No outpatient prescriptions have been marked as taking for the 09/16/16 encounter (Appointment) with Vevelyn Francois, NP.    ROS  Constitutional: Denies any fever or chills Gastrointestinal: No reported hemesis, hematochezia, vomiting, or acute GI distress Musculoskeletal: Denies any acute onset joint swelling, redness, loss of  ROM, or weakness Neurological: No reported episodes of acute onset apraxia, aphasia, dysarthria, agnosia, amnesia, paralysis, loss of coordination, or loss of consciousness  Allergies  Mark [name] is allergic to cozaar [losartan] and lisinopril.  [place]  Drug: Mark [name]  reports that he does not use drugs. Alcohol:  reports that he does not drink alcohol. Tobacco:  reports that he has been smoking Cigarettes.  He has a 15.00 pack-year smoking history. He has never used smokeless tobacco. Medical:  has a  past medical history of Alcohol withdrawal ([place]); Allergy; Anxiety (Dx 2014); COPD (chronic obstructive pulmonary disease) ([place]); Drug abuse; Enlarged prostate; ETOH abuse; GERD (gastroesophageal reflux disease) (Dx 2003); Headache(784.0); Hepatitis; Hyperlipidemia (Dx 2000); Hypertension (Dx 2011); IBS (irritable bowel syndrome); Irregular heart beat; Long Q-T syndrome (01/23/2014); Mental disorder; Neuromuscular disorder ([place]); Neuropathy (06/07/2013); Seizure due to alcohol withdrawal ([place]) (01/23/2014); Shortness of breath; and Withdrawal seizures ([place]) (2014). Surgical: Mark [name]  has a past surgical history that includes Back surgery (1995, 1999); Neck surgery (2000); Nasal sinus surgery; left heart catheterization with coronary angiogram (N/A, 01/13/2014); Subacromial decompression (Right, 11/16/2014); Nissen fundoplication; 24 hour ph study (N/A, 01/16/2015); Upper gastrointestinal endoscopy; and Colonoscopy (1998). Family: family history includes Alcohol abuse in his father and paternal grandfather; Breast cancer in his paternal grandmother; Colon polyps in his mother; Hypertension in his mother; Lung cancer in his father.  Constitutional Exam  General appearance: Well nourished, well developed, and well hydrated. In no apparent acute distress There were no vitals filed for this visit. BMI Assessment: Estimated body mass index is 25.39 kg/m as calculated from the following:   Height as of  09/02/16: _0  (1.93 m).   Weight as of 09/02/16: 208 lb 9.6 oz (94.6 kg).  BMI interpretation table: BMI level Category Range association with higher incidence of chronic pain  <18 kg/m2 Underweight   18.5-24.9 kg/m2 Ideal body weight   25-29.9 kg/m2 Overweight Increased incidence by 20%  30-34.9 kg/m2 Obese (Class I) Increased incidence by 68%  35-39.9 kg/m2 Severe obesity (Class II) Increased incidence by 136%  >40 kg/m2 Extreme obesity (Class III) Increased incidence by 254%   BMI Readings from Last 4 Encounters:  08/26/16 27.39 kg/m  08/22/16 27.39 kg/m  07/29/16 26.78 kg/m  07/22/16 26.78 kg/m   Wt Readings from Last 4 Encounters:  08/26/16 225 lb (102.1 kg)  08/22/16 225 lb (102.1 kg)  07/29/16 220 lb (99.8 kg)  07/22/16 220 lb (99.8 kg)  Psych/Mental status: Alert, oriented x 3 (person, place, & time)       Eyes: PERLA Respiratory: No evidence of acute respiratory distress  Cervical Spine Exam  Inspection: No masses, redness, or swelling Alignment: Symmetrical Functional ROM: Unrestricted ROM      Stability: No instability detected Muscle strength & Tone: Functionally intact Sensory: Unimpaired Palpation: No palpable anomalies              Upper Extremity (UE) Exam    Side: Right upper extremity  Side: Left upper extremity  Inspection: No masses, redness, swelling, or asymmetry. No contractures  Inspection: No masses, redness, swelling, or asymmetry. No contractures  Functional ROM: Unrestricted ROM          Functional ROM: Unrestricted ROM          Muscle strength & Tone: Functionally intact  Muscle strength & Tone: Functionally intact  Sensory: Unimpaired  Sensory: Unimpaired  Palpation: No palpable anomalies              Palpation: No palpable anomalies              Specialized Test(s): Deferred         Specialized Test(s): Deferred          Thoracic Spine Exam  Inspection: No masses, redness, or swelling Alignment: Symmetrical Functional ROM:  Unrestricted ROM Stability: No instability detected Sensory: Unimpaired Muscle strength & Tone: No palpable anomalies  Lumbar Spine Exam  Inspection: No masses, redness, or swelling Alignment: Symmetrical Functional ROM: Unrestricted ROM  Stability: No instability detected Muscle strength & Tone: Functionally intact Sensory: Unimpaired Palpation: No palpable anomalies       Provocative Tests: Lumbar Hyperextension and rotation test: evaluation deferred today       Patrick's Maneuver: evaluation deferred today                    Gait & Posture Assessment  Ambulation: Unassisted Gait: Relatively normal for age and body habitus Posture: WNL   Lower Extremity Exam    Side: Right lower extremity  Side: Left lower extremity  Inspection: No masses, redness, swelling, or asymmetry. No contractures  Inspection: No masses, redness, swelling, or asymmetry. No contractures  Functional ROM: Unrestricted ROM          Functional ROM: Unrestricted ROM          Muscle strength & Tone: Functionally intact  Muscle strength & Tone: Functionally intact  Sensory: Unimpaired  Sensory: Unimpaired  Palpation: No palpable anomalies  Palpation: No palpable anomalies   Assessment  Primary Diagnosis & Pertinent Problem List: Diagnoses of Chronic pain syndrome and Musculoskeletal pain were pertinent to this visit.  Status Diagnosis  Controlled Controlled Controlled 1. Chronic pain syndrome   2. Musculoskeletal pain     Problems updated and reviewed during this visit: Problem  Lumbar facet syndrome (Right)  Chronic sacroiliac joint pain (Right)  Chronic lower extremity pain (Referred) (Bilateral) (R>L)  Musculoskeletal Pain  History of Fusion of Cervical Spine  Chronic neck pain (Bilateral)(R>L)  Cervical Fusion Syndrome  Failed Back Surgical Syndrome  Chronic knee pain  (Bilateral) (R>L)  Chronic radicular low back pain (Location of Tertiary source of pain) (Right) (to calf)  Chronic  shoulder pain (Right)   Fell in March 2016 on right shoulder. 06/22/2014: [facility] [place]:  Partial full-thickness tear with minimal retraction of supraspinatus. 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 (?/Likely alcohol related versus methadone related according to patient)   Opiate Use  Long Term Current Use of Opiate Analgesic  Long Term Prescription Opiate Use  Opioid Dependence ([name])  Bipolar Affective Disorder, Depressed, Severe, With Psychotic Behavior ([name])  Occipital headache (Bilateral) (R>L)  Opioid-Induced Sexual Dysfunction ([name])  Major Depressive Disorder, Recurrent Episode, Moderate ([name])  Gad (Generalized Anxiety Disorder)  Alcohol Use Disorder, Severe, in Early Remission ([name])  Suicidal Ideation  Bipolar Affective Disorder, Currently Depressed, Moderate ([name])  Non-Sustained Ventricular Tachycardia ([name])  Gerd (Gastroesophageal Reflux Disease)  Chronic Anxiety  Tender Prostate  Erectile Dysfunction  Poor Dentition  Chronic Maxillary Sinusitis  Vitamin D Insufficiency  Closed Fracture of 5th Metacarpal  Chronic Fatigue  Bipolar Depression ([name])  Neuropathy, Peripheral (Feet) ([name]) (Location of Primary Source of Pain) (Bilateral) (R>L)   Patient reports due to unknown etiology (history of alcohol abuse)   Alcohol Use Disorder, Severe, Dependence ([name])  Long Q-T Syndrome  Hypokalemia  Paroxysmal VT ([name])  Alcohol Intoxication ([name])  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  New Prescriptions    No medications on file   Medications administered today: Mark [name] had no medications administered  during this visit. Lab-work, procedure(s), and/or referral(s): No orders of the defined types were placed in this encounter.  Imaging and/or referral(s): None  Interventional therapies: Planned, scheduled, and/or pending:   Not at this time.   Considering:   ***   Palliative PRN treatment(s):   ***   Provider-requested follow-up: No Follow-up on file.  Future Appointments Date Time Provider [place]  09/16/2016 10:00 AM Vevelyn Francois, NP [facility] None  09/26/2016 11:00 AM Milinda Pointer, MD [facility] None  11/12/2016 2:30 PM Eksir, Richard Miu, MD [facility] None   Primary Care Physician: Boykin Nearing, MD Location: [facility] Note by: Vevelyn Francois NP Date: 09/16/2016; Time: 8:44 AM  Pain Score Disclaimer: We use the NRS-11 scale. This is a self-reported, subjective measurement of pain severity with only modest accuracy. It is used primarily to identify changes within a particular patient. It must be understood that outpatient pain scales are significantly less accurate that those used for research, where they can be applied under ideal controlled circumstances with minimal exposure to variables. In reality, the score is likely to be a combination of pain intensity and pain affect, where pain affect describes the degree of emotional arousal or changes in action readiness caused by the sensory experience of pain. Factors such as social and work situation, setting, emotional state, anxiety levels, expectation, and prior pain experience may influence pain perception and show large inter-individual differences that may also be affected by time variables.  Patient instructions provided during this appointment: There are no Patient Instructions on file for this visit.

## 2016-09-16 NOTE — Progress Notes (Signed)
Patient's Name: Mark [name]  MRN: [id number]  Referring Provider: Boykin Nearing, MD  DOB: 04/12/1965  PCP: Boykin Nearing, MD  DOS: 09/16/2016  Note by: Vevelyn Francois NP  Service setting: Ambulatory outpatient  Specialty: Interventional Pain Management  Location: [facility] (AMB) Pain Management Facility    Patient type: Established    Primary Reason(s) for Visit: Encounter for prescription drug management  (Level of risk: moderate) CC: Back Pain (low); Foot Pain (bilateral, neuropathy); and Leg Pain (bilateral)  HPI  Mark [name] is a 51 y.o. year old, male patient, who comes today for medication management. 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 ([place]); Alcohol intoxication ([place]); Chronic low back pain (Location of Secondary source of pain) (Bilateral) (R>L); Chronic pain syndrome; Paroxysmal VT ([place]); Opioid dependence ([place]); Long Q-T syndrome; Hypokalemia; Alcohol use disorder, severe, dependence ([place]); Bipolar depression ([place]); Neuropathy, Peripheral (Feet) ([name]) (Location of Primary Source of Pain) (Bilateral) (R>L); Chronic shoulder pain (Right); Chronic radicular low back pain (Location of Tertiary source of pain) (Right) (to calf); Closed fracture of 5th metacarpal; Chronic fatigue; Vitamin D insufficiency; Poor dentition; Chronic maxillary sinusitis; Tender prostate; Erectile dysfunction; Chronic anxiety; GERD (gastroesophageal reflux disease); Suicidal ideation; Bipolar affective disorder, currently depressed, moderate ([place]); Non-sustained ventricular tachycardia ([place]); GAD (generalized anxiety disorder); Alcohol use disorder, severe, in early remission ([place]); Long term current use of opiate analgesic; Long term prescription opiate use; History of fusion of cervical spine; Chronic neck pain (Bilateral)(R>L); Cervical fusion syndrome; Failed back surgical syndrome; Chronic knee pain   (Bilateral) (R>L); Major depressive disorder, recurrent episode, moderate ([place]); Musculoskeletal pain; 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 His primarily concern today is the Back Pain (low); Foot Pain (bilateral, neuropathy); and Leg Pain (bilateral)  Pain Assessment: Location: Right, Left Foot Radiating:   Onset: More than a month ago Duration: Chronic pain Quality: Burning, Constant Severity: 4 /10 (self-reported pain score)  Note: Reported level is compatible with observation.                   Effect on ADL:   Timing: Constant Modifying factors: medications, warm baths  Mark [name] was last seen on 06/20/16 for a procedure. During today's appointment we reviewed Mark [name] post-procedure results, as well as his outpatient medication regimen. He states that the neuropathy. He has numbness and tingling from his knees down to his toes. He feels like this is related to previous methadone use. He states that for a few days he felt relief in his lower extremities after his last procedure. He states that his hip is constantly painful. He has problems going up and down steps.   Further details on both, my assessment(s), as well as the proposed treatment plan, please see below.  Controlled Substance Pharmacotherapy Assessment REMS (Risk Evaluation and Mitigation Strategy)  Tramadol 100 mg 4 times a day (400 mg/day of tramadol)  MME/day: 40 mg/day.  Hart Rochester, RN  09/16/2016 10:09 AM  Signed Nursing Pain Medication Assessment:  Safety precautions to be maintained throughout the outpatient stay will include: orient to surroundings, keep bed in low position, maintain call bell within reach at all times,  provide assistance with transfer out of bed and ambulation.  Medication Inspection Compliance: Pill count conducted under aseptic  conditions, in front of the patient. Neither the pills nor the bottle was removed from the patient's sight at any time. Once count was completed pills were immediately returned to the patient in their original bottle.  Medication: Tramadol (Ultram) Pill/Patch Count: 0 of 240 pills remain Pill/Patch Appearance: Markings consistent with prescribed medication Bottle Appearance: Standard pharmacy container. Clearly labeled. Filled Date: 06 / 05 / 2018 Last Medication intake:  09/13/2016   Upon taking patient his prescription with the date of do not fill until 09/18/16 he states that this is wrong and there is a problem somewhere.  Further investigating shows where Dr Dossie [name] wrote for Tramadol 50 mg on 06/20/16 to last until 09/18/16.  Patient still thinks this is in correct.  Called to patients pharmacy for fill dates.  06/20/16 qty 240 07/17/16 qty 240 08/13/16 qty 240  Asked pharmacy why these Rx's are being filled prior to the 30 days and she stated patient had claimed that he had to go out of town.  Asked pharmacist to please place a note to not fill Rx's sooner than 30 days anymore.  Explained all of this to patient and told him when he fills his medication early he has to make it last til the next fill date and that if he is out of medication he must have taken more than the prescribed dose.  Patient states that this is possible as he gets up at night hurting.  Patient aware that I have spoken with pharmacy and asked them to only fill Rx's every 30 days with no early fills.    Pharmacokinetics: Liberation and absorption (onset of action): WNL Distribution (time to peak effect): WNL Metabolism and excretion (duration of action): WNL         Pharmacodynamics: Desired effects: Analgesia: Mark [name] reports >50% benefit. Functional ability: Patient reports that  medication allows him to accomplish basic ADLs Clinically meaningful improvement in function (CMIF): Sustained CMIF goals met Perceived effectiveness: Described as relatively effective, allowing for increase in activities of daily living (ADL) Undesirable effects: Side-effects or Adverse reactions: None reported Monitoring: [place] PMP: Online review of the past 61-monthperiod conducted. Compliant with practice rules and regulations List of all UDS test(s) done:  Lab Results  Component Value Date   TEscalonFINAL 05/14/2016   SUMMARY FINAL 03/18/2016   Last UDS on record: [employer] Select 13  Date Value Ref Range Status  05/14/2016 FINAL  Final    Comment:    ==================================================================== TOXASSURE SELECT 13 (MW) ==================================================================== Test                             Result       Flag       Units Drug Present and Declared for Prescription Verification   Tramadol                       PRESENT      EXPECTED   O-Desmethyltramadol            PRESENT      EXPECTED   N-Desmethyltramadol            PRESENT      EXPECTED    Source of tramadol is a prescription medication.    O-desmethyltramadol and N-desmethyltramadol are expected    metabolites of tramadol. ==================================================================== Test  Result    Flag   Units      Ref Range   Creatinine              187              mg/dL      >=20 ==================================================================== Declared Medications:  The flagging and interpretation on this report are based on the  following declared medications.  Unexpected results may arise from  inaccuracies in the declared medications.  **Note: The testing scope of this panel includes these medications:  Tramadol (Ultram)  **Note: The testing scope of this panel does not include following  reported medications:  Albuterol  (Ventolin HFA)  Cyclobenzaprine (Flexeril)  Dicyclomine (Bentyl)  Gabapentin  Metoprolol (Lopressor)  Tadalafil (Cialis)  Trazodone ==================================================================== For clinical consultation, please call [phone]. ====================================================================    Summary  Date Value Ref Range Status  03/18/2016 FINAL  Final    Comment:    ==================================================================== TOXASSURE COMP DRUG ANALYSIS,UR ==================================================================== Test                             Result       Flag       Units Drug Present and Declared for Prescription Verification   Codeine                        >23810       EXPECTED   ng/mg creat   Morphine                       8910         EXPECTED   ng/mg creat   Norcodeine                     960          EXPECTED   ng/mg creat    Sources of codeine include scheduled prescription medications;    morphine is an expected metabolite of codeine. Other sources of    morphine include scheduled prescription medications or as a    metabolite of heroin.    Norcodeine is an expected metabolite of codeine.   Hydrocodone                    145          EXPECTED   ng/mg creat    Hydrocodone is a minor metabolite of codeine; concentration    rarely exceeds 15% of the codeine concentration when this is the    source.    Sources of hydrocodone include scheduled prescription    medications.   Tramadol                       PRESENT      EXPECTED   O-Desmethyltramadol            PRESENT      EXPECTED   N-Desmethyltramadol            PRESENT      EXPECTED    Source of tramadol is a prescription medication.    O-desmethyltramadol and N-desmethyltramadol are expected    metabolites of tramadol.   Gabapentin                     PRESENT      EXPECTED   Trazodone  PRESENT      EXPECTED   Acetaminophen                   PRESENT      EXPECTED   Metoprolol                     PRESENT      EXPECTED Drug Absent but Declared for Prescription Verification   Cyclobenzaprine                Not Detected UNEXPECTED ==================================================================== Test                      Result    Flag   Units      Ref Range   Creatinine              42               mg/dL      >=20 ==================================================================== Declared Medications:  The flagging and interpretation on this report are based on the  following declared medications.  Unexpected results may arise from  inaccuracies in the declared medications.  **Note: The testing scope of this panel includes these medications:  Codeine (Tylenol 3)  Cyclobenzaprine (Flexeril)  Gabapentin (Neurontin)  Metoprolol (Lopressor)  Tramadol (Ultram)  Trazodone (Desyrel)  **Note: The testing scope of this panel does not include small to  moderate amounts of these reported medications:  Acetaminophen (Tylenol 3)  **Note: The testing scope of this panel does not include following  reported medications:  Dicyclomine (Bentyl)  Tadalafil (Cialis) ==================================================================== For clinical consultation, please call [phone]. ====================================================================    UDS interpretation: Compliant          Medication Assessment Form: Reviewed. Patient indicates being compliant with therapy Treatment compliance: Compliant Risk Assessment Profile: Aberrant behavior: taking more medication than prescribed Comorbid factors increasing risk of overdose: age 39-58 years old, caucasian and history of alcoholism Risk of substance use disorder (SUD): High-to-Very High Opioid Risk Tool (ORT) Total Score:    Interpretation Table:  Score <3 = Low Risk for SUD  Score between 4-7 = Moderate Risk for SUD  Score >8 = High Risk for Opioid Abuse   Risk  Mitigation Strategies:  Patient Counseling: Covered Patient-Prescriber Agreement (PPA): Present and active  Notification to other healthcare providers: Done  Pharmacologic Plan: No change in therapy, at this time  Laboratory Chemistry  Inflammation Markers (CRP: Acute Phase) (ESR: Chronic Phase) Lab Results  Component Value Date   CRP 0.9 03/18/2016   ESRSEDRATE 2 03/18/2016                 Renal Function Markers Lab Results  Component Value Date   BUN 5 (L) 05/15/2016   CREATININE 0.72 05/15/2016   GFRAA >60 05/15/2016   GFRNONAA >60 05/15/2016                 Hepatic Function Markers Lab Results  Component Value Date   AST 30 05/15/2016   ALT 40 05/15/2016   ALBUMIN 4.7 05/15/2016   ALKPHOS 87 05/15/2016   HCVAB NEGATIVE 06/14/2012                 Electrolytes Lab Results  Component Value Date   NA 138 05/15/2016   K 3.4 (L) 05/15/2016   CL 101 05/15/2016   CALCIUM 9.0 05/15/2016   MG 2.1 03/18/2016  Neuropathy Markers Lab Results  Component Value Date   VITAMINB12 244 03/18/2016                 Bone Pathology Markers Lab Results  Component Value Date   ALKPHOS 87 05/15/2016   VD25OH 21 (L) 07/04/2014   25OHVITD1 35 03/18/2016   25OHVITD2 1.0 03/18/2016   25OHVITD3 34 03/18/2016   CALCIUM 9.0 05/15/2016   TESTOFREE 78.9 09/16/2014   TESTOSTERONE 365 09/16/2014                 Coagulation Parameters Lab Results  Component Value Date   INR 1.04 06/07/2013   LABPROT 13.4 06/07/2013   PLT 298 05/15/2016                 Cardiovascular Markers Lab Results  Component Value Date   BNP 23.9 02/27/2015   HGB 17.7 (H) 05/15/2016   HCT 49.2 05/15/2016                 Note: Lab results reviewed.  Recent Diagnostic Imaging Review  Dg C-arm 1-60 Min-no Report  Result Date: 08/26/2016 Fluoroscopy was utilized by the requesting physician.  No radiographic interpretation.   Note: Imaging results reviewed.          Meds    Current Meds  Medication Sig  . gabapentin (NEURONTIN) 800 MG tablet Take 1 tablet (800 mg total) by mouth 4 (four) times daily.  Marland Kitchen losartan (COZAAR) 50 MG tablet Take 50 mg by mouth daily.  . metoprolol (LOPRESSOR) 50 MG tablet Take 1 tablet (50 mg total) by mouth 2 (two) times daily.  . VENTOLIN HFA 108 (90 Base) MCG/ACT inhaler INHALE 2 PUFFS INTO THE LUNGS EVERY 4 HOURS AS NEEDED FOR WHEEZING OR SHORTNESS OF BREATH.  . [DISCONTINUED] baclofen (LIORESAL) 10 MG tablet Take 1 tablet (10 mg total) by mouth 3 (three) times daily.  . [DISCONTINUED] cyclobenzaprine (FLEXERIL) 10 MG tablet Take 1 tablet (10 mg total) by mouth at bedtime.  . [DISCONTINUED] traMADol (ULTRAM) 50 MG tablet Take 2 tablets (100 mg total) by mouth 4 (four) times daily. 1-2 tablets    ROS  Constitutional: Denies any fever or chills Gastrointestinal: No reported hemesis, hematochezia, vomiting, or acute GI distress Musculoskeletal: Denies any acute onset joint swelling, redness, loss of ROM, or weakness Neurological: No reported episodes of acute onset apraxia, aphasia, dysarthria, agnosia, amnesia, paralysis, loss of coordination, or loss of consciousness  Allergies  Mark [name] is allergic to cozaar [losartan] and lisinopril.  [name]  Drug: Mark [name]  reports that he does not use drugs. Alcohol:  reports that he does not drink alcohol. Tobacco:  reports that he has been smoking Cigarettes.  He has a 15.00 pack-year smoking history. He has never used smokeless tobacco. Medical:  has a past medical history of Alcohol withdrawal ([place]); Allergy; Anxiety (Dx 2014); COPD (chronic obstructive pulmonary disease) ([place]); Drug abuse; Enlarged prostate; ETOH abuse; GERD (gastroesophageal reflux disease) (Dx 2003); Headache(784.0); Hepatitis; Hyperlipidemia (Dx 2000); Hypertension (Dx 2011); IBS (irritable bowel syndrome); Irregular heart beat; Long Q-T syndrome (01/23/2014); Mental disorder; Neuromuscular disorder ([place]);  Neuropathy (06/07/2013); Seizure due to alcohol withdrawal ([place]) (01/23/2014); Shortness of breath; and Withdrawal seizures ([place]) (2014). Surgical: Mr. Hazelip  has a past surgical history that includes Back surgery (1995, 1999); Neck surgery (2000); Nasal sinus surgery; left heart catheterization with coronary angiogram (N/A, 01/13/2014); Subacromial decompression (Right, 11/16/2014); Nissen fundoplication; 24 hour ph study (N/A, 01/16/2015); Upper gastrointestinal endoscopy; and Colonoscopy (1998). Family:  family history includes Alcohol abuse in his father and paternal grandfather; Breast cancer in his paternal grandmother; Colon polyps in his mother; Hypertension in his mother; Lung cancer in his father.  Constitutional Exam  General appearance: Well nourished, well developed, and well hydrated. In no apparent acute distress Vitals:   09/16/16 1010 09/16/16 1011  BP: (!) 134/95   Pulse: 70   Resp: 18   Temp: 98.3 F (36.8 C) 98.3 F (36.8 C)  TempSrc: Oral Oral  SpO2: 100%   Weight: 215 lb (97.5 kg)   Height: _0  (1.93 m)    BMI Assessment: Estimated body mass index is 26.17 kg/m as calculated from the following:   Height as of this encounter: _1  (1.93 m).   Weight as of this encounter: 215 lb (97.5 kg).  BMI interpretation table: BMI level Category Range association with higher incidence of chronic pain  <18 kg/m2 Underweight   18.5-24.9 kg/m2 Ideal body weight   25-29.9 kg/m2 Overweight Increased incidence by 20%  30-34.9 kg/m2 Obese (Class I) Increased incidence by 68%  35-39.9 kg/m2 Severe obesity (Class II) Increased incidence by 136%  >40 kg/m2 Extreme obesity (Class III) Increased incidence by 254%   BMI Readings from Last 4 Encounters:  09/16/16 26.17 kg/m  08/26/16 27.39 kg/m  08/22/16 27.39 kg/m  07/29/16 26.78 kg/m   Wt Readings from Last 4 Encounters:  09/16/16 215 lb (97.5 kg)  08/26/16 225 lb (102.1 kg)  08/22/16 225 lb (102.1 kg)  07/29/16 220 lb  (99.8 kg)  Psych/Mental status: Alert, oriented x 3 (person, place, & time)       Eyes: PERLA Respiratory: No evidence of acute respiratory distress  Cervical Spine Exam  Inspection: No masses, redness, or swelling Alignment: Symmetrical Functional ROM: Unrestricted ROM      Stability: No instability detected Muscle strength & Tone: Functionally intact Sensory: Unimpaired Palpation: No palpable anomalies              Upper Extremity (UE) Exam    Side: Right upper extremity  Side: Left upper extremity  Inspection: No masses, redness, swelling, or asymmetry. No contractures  Inspection: No masses, redness, swelling, or asymmetry. No contractures  Functional ROM: Unrestricted ROM          Functional ROM: Unrestricted ROM          Muscle strength & Tone: Functionally intact  Muscle strength & Tone: Functionally intact  Sensory: Unimpaired  Sensory: Unimpaired  Palpation: No palpable anomalies              Palpation: No palpable anomalies              Specialized Test(s): Deferred         Specialized Test(s): Deferred          Thoracic Spine Exam  Inspection: No masses, redness, or swelling Alignment: Symmetrical Functional ROM: Unrestricted ROM Stability: No instability detected Sensory: Unimpaired Muscle strength & Tone: No palpable anomalies  Lumbar Spine Exam  Inspection: Well healed scar from previous spine surgery detected Alignment: Symmetrical Functional ROM: Unrestricted ROM      Stability: No instability detected Muscle strength & Tone: Functionally intact Sensory: Unimpaired Palpation: Complains of area being tender to palpation       Provocative Tests: Lumbar Hyperextension and rotation test: evaluation deferred today       Patrick's Maneuver: evaluation deferred today  Gait & Posture Assessment  Ambulation: Unassisted Gait: Relatively normal for age and body habitus Posture: WNL   Lower Extremity Exam    Side: Right lower extremity  Side:  Left lower extremity  Inspection: No masses, redness, swelling, or asymmetry. No contractures  Inspection: No masses, redness, swelling, or asymmetry. No contractures  Functional ROM: Unrestricted ROM          Functional ROM: Unrestricted ROM          Muscle strength & Tone: Functionally intact  Muscle strength & Tone: Functionally intact  Sensory: Unimpaired  Sensory: Unimpaired  Palpation: No palpable anomalies  Palpation: No palpable anomalies   Assessment  Primary Diagnosis & Pertinent Problem List: The primary encounter diagnosis was Chronic low back pain (Location of Secondary source of pain) (Bilateral) (R>L). Diagnoses of Neuropathy, Peripheral (Feet) ([name]) (Location of Primary Source of Pain) (Bilateral) (R>L), Musculoskeletal pain, Failed back surgical syndrome, Chronic pain syndrome, and Long term current use of opiate analgesic were also pertinent to this visit.  Status Diagnosis  Controlled Controlled Controlled 1. Chronic low back pain (Location of Secondary source of pain) (Bilateral) (R>L)   2. Neuropathy, Peripheral (Feet) ([name]) (Location of Primary Source of Pain) (Bilateral) (R>L)   3. Musculoskeletal pain   4. Failed back surgical syndrome   5. Chronic pain syndrome   6. Long term current use of opiate analgesic     Problems updated and reviewed during this visit: Problem  Lumbar facet syndrome (Right)  Chronic sacroiliac joint pain (Right)  Chronic lower extremity pain (Referred) (Bilateral) (R>L)  Musculoskeletal Pain  History of Fusion of Cervical Spine  Chronic neck pain (Bilateral)(R>L)  Cervical Fusion Syndrome  Failed Back Surgical Syndrome  Chronic knee pain  (Bilateral) (R>L)  Chronic radicular low back pain (Location of Tertiary source of pain) (Right) (to calf)  Chronic shoulder pain (Right)   Fell in March 2016 on right shoulder. 06/22/2014: [facility] [place]:  Partial full-thickness tear with minimal retraction of supraspinatus. Status post injection and  referral to PT   Chronic low back pain (Location of Secondary source of pain) (Bilateral) (R>L)  Chronic Pain Syndrome   Acute right shoulder pain in March 2016 Chronic neck and low back pain History of methadone use Bilateral peripheral neuropathy of unknown etiology (?/Likely alcohol related versus methadone related according to patient)   Opiate Use  Long Term Current Use of Opiate Analgesic  Long Term Prescription Opiate Use  Opioid Dependence ([name])  Bipolar Affective Disorder, Depressed, Severe, With Psychotic Behavior ([name])  Occipital headache (Bilateral) (R>L)  Opioid-Induced Sexual Dysfunction ([name])  Major Depressive Disorder, Recurrent Episode, Moderate ([name])  Gad (Generalized Anxiety Disorder)  Alcohol Use Disorder, Severe, in Early Remission ([name])  Suicidal Ideation  Bipolar Affective Disorder, Currently Depressed, Moderate ([name])  Non-Sustained Ventricular Tachycardia ([name])  Gerd (Gastroesophageal Reflux Disease)  Chronic Anxiety  Tender Prostate  Erectile Dysfunction  Poor Dentition  Chronic Maxillary Sinusitis  Vitamin D Insufficiency  Closed Fracture of 5th Metacarpal  Chronic Fatigue  Bipolar Depression ([name])  Neuropathy, Peripheral (Feet) ([name]) (Location of Primary Source of Pain) (Bilateral) (R>L)   Patient reports due to unknown etiology (history of alcohol abuse)   Alcohol Use Disorder, Severe, Dependence ([name])  Long Q-T Syndrome  Hypokalemia  Paroxysmal VT ([name])  Alcohol Intoxication ([name])  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 traMADol (ULTRAM) 50 MG tablet    Sig: Take 2 tablets (100 mg total) by mouth 4 (four) times daily. 1-2 tablets    Dispense:  240 tablet    Refill:  2    Fill one day early if pharmacy is closed on scheduled  refill date. Do not fill until: 09/18/16 To last until: 12/17/16    Order Specific Question:   Supervising Provider    Answer:   Milinda Pointer [phone]  . baclofen (LIORESAL) 10 MG tablet    Sig: Take 1 tablet (10 mg total) by mouth 3 (three) times daily.    Dispense:  90 tablet    Refill:  2    Do not place this medication, or any other prescription from our practice, on "Automatic Refill". Patient may have prescription filled one day early if pharmacy is closed on scheduled refill date.    Order Specific Question:   Supervising Provider    Answer:   Milinda Pointer [phone]  . cyclobenzaprine (FLEXERIL) 10 MG tablet    Sig: Take 1 tablet (10 mg total) by mouth at bedtime.    Dispense:  30 tablet    Refill:  2    Do not place medication on "Automatic Refill". Fill one day early if pharmacy is closed on scheduled refill date.    Order Specific Question:   Supervising Provider    Answer:   Milinda Pointer [[id number]]   New Prescriptions   No medications on file   Medications administered today: Mark [name] had no medications administered during this visit. Lab-work, procedure(s), and/or referral(s): Orders Placed This Encounter  Procedures  . ToxASSURE Select 13 (MW), Urine   Imaging and/or referral(s): None  Interventional therapies: Planned, scheduled, and/or pending:      Considering:   Lower extremity EMG/PNCV. (Radiculopathy versus peripheral neuropathy) Upper extremity EMG/PNCV.  Possible right-sided L3 + L4 Lumbar sympathetic RFA  Possible Racz procedure Diagnostic cervical epidural steroidinjection  Diagnostic bilateral cervical facet block  Possible bilateral cervical facet RFA Diagnostic bilateral lumbar facet block Possible bilateral lumbar facet RFA Diagnostic right intra-articular shoulder joint injection Diagnostic right suprascapular nerveblock Possibleright suprascapular RFA Diagnostic bilateral intra-articular knee joint  injection Possible bilateral series of 5 Hyalgan knee injections Diagnostic bilateral Genicular nerve block Possible bilateral Genicular RFA Diagnostic bilateral greater occipital nerve block  Possible bilateral greater occipital nerve RFA  Diagnostic bilateral C2 + TON nerve block  Possible bilateral C2 + TON RFA    Palliative PRN treatment(s):   Diagnostic Caudal ESI #3 Diagnostic right-sided L3 + L4 lumbar sympathetic block #3      Provider-requested follow-up: Return in about 3 months (around 12/17/2016) for MedMgmt.  Future Appointments Date Time Provider [place]  09/26/2016 11:00 AM Milinda Pointer, MD [facility] None  11/12/2016 2:30 PM Daron Offer, Richard Miu, MD [facility] None  12/11/2016 10:30 AM Vevelyn Francois, NP [facility] None   Primary Care Physician: Boykin Nearing, MD Location: [facility] Note by: Vevelyn Francois NP Date: 09/16/2016; Time: 1:23 PM  Pain Score Disclaimer: We use the NRS-11 scale. This is a self-reported, subjective measurement of pain severity with only modest accuracy. It is used primarily to identify changes within a particular patient. It must be understood that outpatient pain scales are significantly less accurate that those used for research, where they can be applied under ideal controlled circumstances with minimal exposure to variables. In reality, the score is likely to be a combination of pain intensity and  pain affect, where pain affect describes the degree of emotional arousal or changes in action readiness caused by the sensory experience of pain. Factors such as social and work situation, setting, emotional state, anxiety levels, expectation, and prior pain experience may influence pain perception and show large inter-individual differences that may also be affected by time variables.  Patient instructions provided during this appointment: Patient Instructions    ____________________________________________________________________________________________  Medication Rules  Applies to: All patients receiving prescriptions (written or electronic).  Pharmacy of record: Pharmacy where electronic prescriptions will be sent. If written prescriptions are taken to a different pharmacy, please inform the nursing staff. The pharmacy listed in the electronic medical record should be the one where you would like electronic prescriptions to be sent.  Prescription refills: Only during scheduled appointments. Applies to both, written and electronic prescriptions.  NOTE: The following applies primarily to controlled substances (Opioid* Pain Medications).   Patient's responsibilities: 1. Pain Pills: Bring all pain pills to every appointment (except for procedure appointments). 2. Pill Bottles: Bring pills in original pharmacy bottle. Always bring newest bottle. Bring bottle, even if empty. 3. Medication refills: You are responsible for knowing and keeping track of what medications you need refilled. The day before your appointment, write a list of all prescriptions that need to be refilled. Bring that list to your appointment and give it to the admitting nurse. Prescriptions will be written only during appointments. If you forget a medication, it will not be "Called in", "Faxed", or "electronically sent". You will need to get another appointment to get these prescribed. 4. Prescription Accuracy: You are responsible for carefully inspecting your prescriptions before leaving our office. Have the discharge nurse carefully go over each prescription with you, before taking them home. Make sure that your name is accurately spelled, that your address is correct. Check the name and dose of your medication to make sure it is accurate. Check the number of pills, and the written instructions to make sure they are clear and accurate. Make sure that you are given enough medication to  last until your next medication refill appointment. 5. Taking Medication: Take medication as prescribed. Never take more pills than instructed. Never take medication more frequently than prescribed. Taking less pills or less frequently is permitted and encouraged, when it comes to controlled substances (written prescriptions).  6. Inform other Doctors: Always inform, all of your healthcare providers, of all the medications you take. 7. Pain Medication from other Providers: You are not allowed to accept any additional pain medication from any other Doctor or Healthcare provider. There are two exceptions to this rule. (see below) In the event that you require additional pain medication, you are responsible for notifying us, as stated below. 8. Medication Agreement: You are responsible for carefully reading and following our Medication Agreement. This must be signed before receiving any prescriptions from our practice. Safely store a copy of your signed Agreement. Violations to the Agreement will result in no further prescriptions. (Additional copies of our Medication Agreement are available upon request.) 9. Laws, Rules, & Regulations: All patients are expected to follow all Federal and [employer], [employer], Rules, [employer]. Ignorance of the Laws does not constitute a valid excuse. The use of any illegal substances is prohibited. 10. Adopted CDC guidelines & recommendations: Target dosing levels will be at or below 60 MME/day. Use of benzodiazepines** is not recommended.  Exceptions: There are only two exceptions to the rule of not receiving pain medications from other Healthcare Providers. 1.  Exception #1 (Emergencies): In the event of an emergency (i.e.: accident requiring emergency care), you are allowed to receive additional pain medication. However, you are responsible for: As soon as you are able, call our office [phone], at any time of the day or night, and leave a message stating your  name, the date and nature of the emergency, and the name and dose of the medication prescribed. In the event that your call is answered by a member of our staff, make sure to document and save the date, time, and the name of the person that took your information.  2. Exception #2 (Planned Surgery): In the event that you are scheduled by another doctor or dentist to have any type of surgery or procedure, you are allowed (for a period no longer than 30 days), to receive additional pain medication, for the acute post-op pain. However, in this case, you are responsible for picking up a copy of our "Post-op Pain Management for Surgeons" handout, and giving it to your surgeon or dentist. This document is available at our office, and does not require an appointment to obtain it. Simply go to our office during business hours (Monday-Thursday from 8:00 AM to 4:00 PM) (Friday 8:00 AM to 12:00 Noon) or if you have a scheduled appointment with [place], prior to your surgery, and ask for it by name. In addition, you will need to provide [place] with your name, name of your surgeon, type of surgery, and date of procedure or surgery.  *Opioid medications include: morphine, codeine, oxycodone, oxymorphone, hydrocodone, hydromorphone, meperidine, tramadol, tapentadol, buprenorphine, fentanyl, methadone. **Benzodiazepine medications include: diazepam (Valium), alprazolam (Xanax), clonazepam (Klonopine), lorazepam (Ativan), clorazepate (Tranxene), chlordiazepoxide (Librium), estazolam (Prosom), oxazepam (Serax), temazepam (Restoril), triazolam (Halcion)  ____________________________________________________________________________________________

## 2016-09-16 NOTE — Progress Notes (Signed)
Nursing Pain Medication Assessment:  Safety precautions to be maintained throughout the outpatient stay will include: orient to surroundings, keep bed in low position, maintain call bell within reach at all times, provide assistance with transfer out of bed and ambulation.  Medication Inspection Compliance: Pill count conducted under aseptic conditions, in front of the patient. Neither the pills nor the bottle was removed from the patient's sight at any time. Once count was completed pills were immediately returned to the patient in their original bottle.  Medication: Tramadol (Ultram) Pill/Patch Count: 0 of 240 pills remain Pill/Patch Appearance: Markings consistent with prescribed medication Bottle Appearance: Standard pharmacy container. Clearly labeled. Filled Date: 06 / 05 / 2018 Last Medication intake:  09/13/2016   Upon taking patient his prescription with the date of do not fill until 09/18/16 he states that this is wrong and there is a problem somewhere.  Further investigating shows where Dr Dossie Arbour wrote for Tramadol 50 mg on 06/20/16 to last until 09/18/16.  Patient still thinks this is in correct.  Called to patients pharmacy for fill dates.  06/20/16 qty 240 07/17/16 qty 240 08/13/16 qty 240  Asked pharmacy why these Rx's are being filled prior to the 30 days and she stated patient had claimed that he had to go out of town.  Asked pharmacist to please place a note to not fill Rx's sooner than 30 days anymore.  Explained all of this to patient and told him when he fills his medication early he has to make it last til the next fill date and that if he is out of medication he must have taken more than the prescribed dose.  Patient states that this is possible as he gets up at night hurting.  Patient aware that I have spoken with pharmacy and asked them to only fill Rx's every 30 days with no early fills.

## 2016-09-18 MED FILL — traMADol HCL 50 MG TABS: 50 | 30 days supply | Qty: 240 | Fill #0

## 2016-09-19 LAB — TOXASSURE SELECT 13 (MW), URINE

## 2016-09-26 ENCOUNTER — Encounter: Payer: Self-pay | Admitting: Pain Medicine

## 2016-09-26 ENCOUNTER — Ambulatory Visit: Payer: Self-pay | Attending: Pain Medicine | Admitting: Pain Medicine

## 2016-09-26 VITALS — BP 158/83 | HR 49 | Temp 98.2°F | Resp 16 | Ht 76.0 in | Wt 215.0 lb

## 2016-09-26 DIAGNOSIS — E559 Vitamin D deficiency, unspecified: Secondary | ICD-10-CM | POA: Insufficient documentation

## 2016-09-26 DIAGNOSIS — N529 Male erectile dysfunction, unspecified: Secondary | ICD-10-CM | POA: Insufficient documentation

## 2016-09-26 DIAGNOSIS — F331 Major depressive disorder, recurrent, moderate: Secondary | ICD-10-CM | POA: Insufficient documentation

## 2016-09-26 DIAGNOSIS — F411 Generalized anxiety disorder: Secondary | ICD-10-CM | POA: Insufficient documentation

## 2016-09-26 DIAGNOSIS — G5790 Unspecified mononeuropathy of unspecified lower limb: Secondary | ICD-10-CM | POA: Insufficient documentation

## 2016-09-26 DIAGNOSIS — G5793 Unspecified mononeuropathy of bilateral lower limbs: Secondary | ICD-10-CM

## 2016-09-26 DIAGNOSIS — I426 Alcoholic cardiomyopathy: Secondary | ICD-10-CM | POA: Insufficient documentation

## 2016-09-26 DIAGNOSIS — M79605 Pain in left leg: Secondary | ICD-10-CM

## 2016-09-26 DIAGNOSIS — I472 Ventricular tachycardia: Secondary | ICD-10-CM | POA: Insufficient documentation

## 2016-09-26 DIAGNOSIS — M79604 Pain in right leg: Secondary | ICD-10-CM

## 2016-09-26 DIAGNOSIS — E785 Hyperlipidemia, unspecified: Secondary | ICD-10-CM | POA: Insufficient documentation

## 2016-09-26 DIAGNOSIS — M792 Neuralgia and neuritis, unspecified: Secondary | ICD-10-CM

## 2016-09-26 DIAGNOSIS — E876 Hypokalemia: Secondary | ICD-10-CM | POA: Insufficient documentation

## 2016-09-26 DIAGNOSIS — J320 Chronic maxillary sinusitis: Secondary | ICD-10-CM | POA: Insufficient documentation

## 2016-09-26 DIAGNOSIS — K219 Gastro-esophageal reflux disease without esophagitis: Secondary | ICD-10-CM | POA: Insufficient documentation

## 2016-09-26 DIAGNOSIS — M542 Cervicalgia: Secondary | ICD-10-CM | POA: Insufficient documentation

## 2016-09-26 DIAGNOSIS — I10 Essential (primary) hypertension: Secondary | ICD-10-CM | POA: Insufficient documentation

## 2016-09-26 DIAGNOSIS — Z981 Arthrodesis status: Secondary | ICD-10-CM | POA: Insufficient documentation

## 2016-09-26 DIAGNOSIS — M25511 Pain in right shoulder: Secondary | ICD-10-CM | POA: Insufficient documentation

## 2016-09-26 DIAGNOSIS — F17210 Nicotine dependence, cigarettes, uncomplicated: Secondary | ICD-10-CM | POA: Insufficient documentation

## 2016-09-26 DIAGNOSIS — J449 Chronic obstructive pulmonary disease, unspecified: Secondary | ICD-10-CM | POA: Insufficient documentation

## 2016-09-26 DIAGNOSIS — M545 Low back pain: Secondary | ICD-10-CM | POA: Insufficient documentation

## 2016-09-26 DIAGNOSIS — N400 Enlarged prostate without lower urinary tract symptoms: Secondary | ICD-10-CM | POA: Insufficient documentation

## 2016-09-26 DIAGNOSIS — G8929 Other chronic pain: Secondary | ICD-10-CM

## 2016-09-26 DIAGNOSIS — F10129 Alcohol abuse with intoxication, unspecified: Secondary | ICD-10-CM | POA: Insufficient documentation

## 2016-09-26 DIAGNOSIS — M533 Sacrococcygeal disorders, not elsewhere classified: Secondary | ICD-10-CM | POA: Insufficient documentation

## 2016-09-26 DIAGNOSIS — Z8371 Family history of colonic polyps: Secondary | ICD-10-CM | POA: Insufficient documentation

## 2016-09-26 DIAGNOSIS — F319 Bipolar disorder, unspecified: Secondary | ICD-10-CM | POA: Insufficient documentation

## 2016-09-26 DIAGNOSIS — K589 Irritable bowel syndrome without diarrhea: Secondary | ICD-10-CM | POA: Insufficient documentation

## 2016-09-26 NOTE — Progress Notes (Signed)
Safety precautions to be maintained throughout the outpatient stay will include: orient to surroundings, keep bed in low position, maintain call bell within reach at all times, provide assistance with transfer out of bed and ambulation.  

## 2016-09-26 NOTE — Progress Notes (Signed)
Patient's Name: Mark [name]  MRN: [id number]  Referring Provider: Boykin Nearing, MD  DOB: [birth year]  PCP: Boykin Nearing, MD  DOS: 09/26/2016  Note by: [name], MD  Service setting: Ambulatory outpatient  Specialty: Interventional Pain Management  Location: [facility] (AMB) Pain Management Facility    Patient type: Established   Primary Reason(s) for Visit: Encounter for post-procedure evaluation of chronic illness with mild to moderate exacerbation CC: Back Pain; Neck Pain; and Foot Pain  HPI  [name] is a 51 y.o. year old, male patient, who comes today for a post-procedure evaluation. 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 ([place]); Alcohol intoxication ([place]); Chronic low back pain (Location of Secondary source of pain) (Bilateral) (R>L); Chronic pain syndrome; Paroxysmal VT ([place]); Opioid dependence ([place]); Long Q-T syndrome; Hypokalemia; Alcohol use disorder, severe, dependence ([place]); Bipolar depression ([place]); Chronic shoulder pain (Right); Chronic radicular low back pain (Location of Tertiary source of pain) (Right) (to calf); Closed fracture of 5th metacarpal; Chronic fatigue; Vitamin D insufficiency; Poor dentition; Chronic maxillary sinusitis; Tender prostate; Erectile dysfunction; Chronic anxiety; GERD (gastroesophageal reflux disease); Suicidal ideation; Bipolar affective disorder, currently depressed, moderate ([place]); Non-sustained ventricular tachycardia ([place]); GAD (generalized anxiety disorder); Alcohol use disorder, severe, in early remission ([place]); Long term current use of opiate analgesic; Long term prescription opiate use; History of fusion of cervical spine; Chronic neck pain (Bilateral)(R>L); Cervical fusion syndrome; Failed back surgical syndrome; Chronic knee pain  (Bilateral) (R>L); Major depressive disorder, recurrent episode, moderate ([place]); Musculoskeletal  pain; 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 His primarily concern today is the Back Pain; Neck Pain; and Foot Pain  Pain Assessment: Location: Lower, Right, Left (right is worse) Back (neck, feet) Radiating: radiates down right leg to calf in the back Onset: More than a month ago Duration: Chronic pain Quality: Burning, Constant Severity: 4 /10 (self-reported pain score)  Note: Reported level is compatible with observation.                   Effect on ADL:   Timing: Constant Modifying factors: medications  [name] comes in today for post-procedure evaluation after the treatment done on 08/26/2016. In reviewing the patient's chart, his best results have occurred with the lumbar sympathetic blocks. In view of this, we will start by doing a radiofrequency ablation of the lumbar sympathetic chain in that we will reexamine what Pain is left.  Further details on both, my assessment(s), as well as the proposed treatment plan, please see below.  Post-Procedure Assessment  08/26/2016 Procedure: Diagnostic right-sided lumbar facet block + SI joint injection under fluoroscopic guidance and IV sedation Pre-procedure pain score:  4/10 Post-procedure pain score: 2/10 Incomplete relief Influential Factors: BMI: 26.17 kg/m Intra-procedural challenges: None observed.         Assessment challenges: None detected.              Reported side-effects: None.        Post-procedural adverse reactions or complications: None reported         Sedation: Please see nurses note. When no sedatives are used, the analgesic levels  obtained are directly associated to the effectiveness of the local  anesthetics. However, when sedation is provided, the level of analgesia obtained during the initial 1 hour following the intervention, is believed to be the result of a combination of factors. These factors may include, but are not limited to: 1. The effectiveness of the local anesthetics used. 2. The effects of the analgesic(s) and/or anxiolytic(s) used. 3. The degree of discomfort experienced by the patient at the time of the procedure. 4. The patients ability and reliability in recalling and recording the events. 5. The presence and influence of possible secondary gains and/or psychosocial factors. Reported result: Relief experienced during the 1st hour after the procedure: 25 % (Ultra-Short Term Relief) Interpretative annotation: Unexpected result. Analgesia during this period is likely to be Local Anesthetic and/or IV Sedative (Analgesic/Anxiolytic) related. Such findings would be suggestive of pain originating at a different site  Effects of local anesthetic: The analgesic effects attained during this period are directly associated to the localized infiltration of local anesthetics and therefore cary significant diagnostic value as to the etiological location, or anatomical origin, of the pain. Expected duration of relief is directly dependent on the pharmacodynamics of the local anesthetic used. Long-acting (4-6 hours) anesthetics used.  Reported result: Relief during the next 4 to 6 hour after the procedure: 25 % (Short-Term Relief) Interpretative annotation: Clinically appropriate result. Analgesia during this period is likely to be Local Anesthetic-related. Partial relief from local anesthetics would suggest that treated area is not 100% responsible for the patient's symptoms.  Long-term benefit: Defined as the period of time past the expected duration of local anesthetics. With the possible exception of prolonged sympathetic  blockade from the local anesthetics, benefits during this period are typically attributed to, or associated with, other factors such as analgesic sensory neuropraxia, antiinflammatory effects, or beneficial biochemical changes provided by agents other than the local anesthetics Reported result: Extended relief following procedure: 0 % (Long-Term Relief) Interpretative annotation: Clinically appropriate result. No benefit. Therapeutic failure. No significant inflammatory component detected.          Current benefits: Defined as persistent relief that continues at this point in time.   Reported results: Treated area: 0 %       Interpretative annotation: Recurrence of symptoms. No permanent benefit expected. Results would suggest persistent aggravating factors.          Interpretation: Results would suggest a neuropathy associated with permanent nerve damage, persistent neural entrapment, or mechanical compression/impingement, as opposed to an inflammatory-mediated neuropraxia          Laboratory Chemistry  Inflammation Markers (CRP: Acute Phase) (ESR: Chronic Phase) Lab Results  Component Value Date   CRP 0.9 03/18/2016   ESRSEDRATE 2 03/18/2016                 Renal Function Markers Lab Results  Component Value Date   BUN 5 (L) 05/15/2016   CREATININE 0.72 05/15/2016   GFRAA >60 05/15/2016   GFRNONAA >60 05/15/2016                 Hepatic Function Markers Lab Results  Component Value Date   AST 30 05/15/2016   ALT 40 05/15/2016   ALBUMIN 4.7 05/15/2016   ALKPHOS 87 05/15/2016   HCVAB NEGATIVE 06/14/2012                 Electrolytes Lab Results  Component Value Date   NA 138 05/15/2016   K 3.4 (L) 05/15/2016   CL 101 05/15/2016   CALCIUM 9.0 05/15/2016   MG 2.1  03/18/2016                 Neuropathy Markers Lab Results  Component Value Date   VITAMINB12 244 03/18/2016                 Bone Pathology Markers Lab Results  Component Value Date   ALKPHOS 87 05/15/2016    VD25OH 21 (L) 07/04/2014   25OHVITD1 35 03/18/2016   25OHVITD2 1.0 03/18/2016   25OHVITD3 34 03/18/2016   CALCIUM 9.0 05/15/2016   TESTOFREE 78.9 09/16/2014   TESTOSTERONE 365 09/16/2014                 Coagulation Parameters Lab Results  Component Value Date   INR 1.04 06/07/2013   LABPROT 13.4 06/07/2013   PLT 298 05/15/2016                 Cardiovascular Markers Lab Results  Component Value Date   BNP 23.9 02/27/2015   HGB 17.7 (H) 05/15/2016   HCT 49.2 05/15/2016                 Note: Lab results reviewed.  Recent Diagnostic Imaging Review  Dg C-arm 1-60 Min-no Report  Result Date: 08/26/2016 Fluoroscopy was utilized by the requesting physician.  No radiographic interpretation.   Note: Imaging results reviewed.          Meds   Current Meds  Medication Sig  . baclofen (LIORESAL) 10 MG tablet Take 1 tablet (10 mg total) by mouth 3 (three) times daily.  . cyclobenzaprine (FLEXERIL) 10 MG tablet Take 1 tablet (10 mg total) by mouth at bedtime.  . gabapentin (NEURONTIN) 800 MG tablet Take 1 tablet (800 mg total) by mouth 4 (four) times daily.  Marland Kitchen losartan (COZAAR) 50 MG tablet Take 50 mg by mouth daily.  . metoprolol (LOPRESSOR) 50 MG tablet Take 1 tablet (50 mg total) by mouth 2 (two) times daily.  . traMADol (ULTRAM) 50 MG tablet Take 2 tablets (100 mg total) by mouth 4 (four) times daily. 1-2 tablets  . VENTOLIN HFA 108 (90 Base) MCG/ACT inhaler INHALE 2 PUFFS INTO THE LUNGS EVERY 4 HOURS AS NEEDED FOR WHEEZING OR SHORTNESS OF BREATH.    ROS  Constitutional: Denies any fever or chills Gastrointestinal: No reported hemesis, hematochezia, vomiting, or acute GI distress Musculoskeletal: Denies any acute onset joint swelling, redness, loss of ROM, or weakness Neurological: No reported episodes of acute onset apraxia, aphasia, dysarthria, agnosia, amnesia, paralysis, loss of coordination, or loss of consciousness  Allergies  Mr. Humbarger is allergic to cozaar  [losartan] and lisinopril.  [place]  Drug: Mr. Jared  reports that he does not use drugs. Alcohol:  reports that he does not drink alcohol. Tobacco:  reports that he has been smoking Cigarettes.  He has a 15.00 pack-year smoking history. He has never used smokeless tobacco. Medical:  has a past medical history of Alcohol withdrawal ([place]); Allergy; Anxiety (Dx 2014); COPD (chronic obstructive pulmonary disease) ([place]); Drug abuse; Enlarged prostate; ETOH abuse; GERD (gastroesophageal reflux disease) (Dx 2003); Headache(784.0); Hepatitis; Hyperlipidemia (Dx 2000); Hypertension (Dx 2011); IBS (irritable bowel syndrome); Irregular heart beat; Long Q-T syndrome (01/23/2014); Mental disorder; Neuromuscular disorder ([place]); Neuropathy (06/07/2013); Seizure due to alcohol withdrawal ([place]) (01/23/2014); Shortness of breath; and Withdrawal seizures ([place]) (2014). Surgical: Mr. Mccubbin  has a past surgical history that includes Back surgery (1995, 1999); Neck surgery (2000); Nasal sinus surgery; left heart catheterization with coronary angiogram (N/A, 01/13/2014); Subacromial decompression (Right, 11/16/2014); Nissen  fundoplication; 24 hour ph study (N/A, 01/16/2015); Upper gastrointestinal endoscopy; and Colonoscopy (1998). Family: family history includes Alcohol abuse in his father and paternal grandfather; Breast cancer in his paternal grandmother; Colon polyps in his mother; Hypertension in his mother; Lung cancer in his father.  Constitutional Exam  General appearance: Well nourished, well developed, and well hydrated. In no apparent acute distress Vitals:   09/26/16 1058  BP: (!) 158/83  Pulse: (!) 49  Resp: 16  Temp: 98.2 F (36.8 C)  SpO2: 100%  Weight: 215 lb (97.5 kg)  Height: _0  (1.93 m)   BMI Assessment: Estimated body mass index is 26.17 kg/m as calculated from the following:   Height as of this encounter: _1  (1.93 m).   Weight as of this encounter: 215 lb (97.5 kg).  BMI interpretation  table: BMI level Category Range association with higher incidence of chronic pain  <18 kg/m2 Underweight   18.5-24.9 kg/m2 Ideal body weight   25-29.9 kg/m2 Overweight Increased incidence by 20%  30-34.9 kg/m2 Obese (Class I) Increased incidence by 68%  35-39.9 kg/m2 Severe obesity (Class II) Increased incidence by 136%  >40 kg/m2 Extreme obesity (Class III) Increased incidence by 254%   BMI Readings from Last 4 Encounters:  09/26/16 26.17 kg/m  09/16/16 26.17 kg/m  08/26/16 27.39 kg/m  08/22/16 27.39 kg/m   Wt Readings from Last 4 Encounters:  09/26/16 215 lb (97.5 kg)  09/16/16 215 lb (97.5 kg)  08/26/16 225 lb (102.1 kg)  08/22/16 225 lb (102.1 kg)  Psych/Mental status: Alert, oriented x 3 (person, place, & time)       Eyes: PERLA Respiratory: No evidence of acute respiratory distress  Cervical Spine Exam  Inspection: No masses, redness, or swelling Alignment: Symmetrical Functional ROM: Unrestricted ROM      Stability: No instability detected Muscle strength & Tone: Functionally intact Sensory: Unimpaired Palpation: No palpable anomalies              Upper Extremity (UE) Exam    Side: Right upper extremity  Side: Left upper extremity  Inspection: No masses, redness, swelling, or asymmetry. No contractures  Inspection: No masses, redness, swelling, or asymmetry. No contractures  Functional ROM: Unrestricted ROM          Functional ROM: Unrestricted ROM          Muscle strength & Tone: Functionally intact  Muscle strength & Tone: Functionally intact  Sensory: Unimpaired  Sensory: Unimpaired  Palpation: No palpable anomalies              Palpation: No palpable anomalies              Specialized Test(s): Deferred         Specialized Test(s): Deferred          Thoracic Spine Exam  Inspection: No masses, redness, or swelling Alignment: Symmetrical Functional ROM: Unrestricted ROM Stability: No instability detected Sensory: Unimpaired Muscle strength & Tone: No  palpable anomalies  Lumbar Spine Exam  Inspection: No masses, redness, or swelling Alignment: Symmetrical Functional ROM: Unrestricted ROM      Stability: No instability detected Muscle strength & Tone: Functionally intact Sensory: Unimpaired Palpation: No palpable anomalies       Provocative Tests: Lumbar Hyperextension and rotation test: evaluation deferred today       Lumbar Lateral bending test: evaluation deferred today       Patrick's Maneuver: evaluation deferred today  Gait & Posture Assessment  Ambulation: Unassisted Gait: Relatively normal for age and body habitus Posture: WNL   Lower Extremity Exam    Side: Right lower extremity  Side: Left lower extremity  Inspection: No masses, redness, swelling, or asymmetry. No contractures  Inspection: No masses, redness, swelling, or asymmetry. No contractures  Functional ROM: Unrestricted ROM          Functional ROM: Unrestricted ROM          Muscle strength & Tone: Functionally intact  Muscle strength & Tone: Functionally intact  Sensory: Unimpaired  Sensory: Unimpaired  Palpation: No palpable anomalies  Palpation: No palpable anomalies   Assessment  Primary Diagnosis & Pertinent Problem List: The primary encounter diagnosis was Lower extremity neuropathy (Location of Primary Source of Pain) (Bilateral) (R>L). Diagnoses of Sympathetic pain (lower extremity) and Chronic lower extremity pain (Bilateral) (R>L) were also pertinent to this visit.  Status Diagnosis  Unimproved Persistent Not responding 1. Lower extremity neuropathy (Location of Primary Source of Pain) (Bilateral) (R>L)   2. Sympathetic pain (lower extremity)   3. Chronic lower extremity pain (Bilateral) (R>L)     Problems updated and reviewed during this visit: Problem  Sympathetic pain (lower extremity)  Lower extremity neuropathy (Location of Primary Source of Pain) (Bilateral) (R>L)  Chronic lower extremity pain (Bilateral) (R>L)   Plan  of Care  Pharmacotherapy (Medications Ordered): No orders of the defined types were placed in this encounter.  New Prescriptions   No medications on file   Medications administered today: Mr. Gahm had no medications administered during this visit.  Lab-work, procedure(s), and/or referral(s): Orders Placed This Encounter  Procedures  . Radiofrequency, sympathectomy    Interventional management options: Planned, scheduled, and/or pending:   Therapeutic right lumbar sympathetic L3 and L4 radiofrequency ablation    Considering:   Lower extremity EMG/PNCV. (Radiculopathy versus peripheral neuropathy) Upper extremity EMG/PNCV.  Possible right-sided L3 + L4 Lumbar sympathetic RFA  Possible Racz procedure Diagnostic cervical epidural steroidinjection  Diagnostic bilateral cervical facet block  Possible bilateral cervical facet RFA Diagnostic bilateral lumbar facet block Possible bilateral lumbar facet RFA Diagnostic right intra-articular shoulder joint injection Diagnostic right suprascapular nerveblock Possibleright suprascapular RFA Diagnostic bilateral intra-articular knee joint injection Possible bilateral series of 5 Hyalgan knee injections Diagnostic bilateral Genicular nerve block Possible bilateral Genicular RFA Diagnostic bilateral greater occipital nerve block  Possible bilateral greater occipital nerve RFA  Diagnostic bilateral C2 + TON nerve block  Possible bilateral C2 + TON RFA    Palliative PRN treatment(s):   Diagnostic Caudal ESI #3 Diagnostic right-sided L3 + L4 lumbar sympathetic block #3    Provider-requested follow-up: Return for procedure (w/ sedation), RFA, (ASAP), by MD.  Future Appointments Date Time Provider [place]  11/12/2016 2:30 PM Daron Offer, Richard Miu, MD [facility] None  12/11/2016 10:30 AM Vevelyn Francois, NP [facility] None  12/26/2016 10:15 AM Milinda Pointer, MD [facility] None   Primary Care Physician: Boykin Nearing, MD Location: [facility] Note by: [name], MD Date: 09/26/2016; Time: 12:02 PM  Patient Instructions  Steps to Quit Smoking Smoking tobacco can be bad for your health. It can also affect almost every organ in your body. Smoking puts you and people around you at risk for many serious long-lasting (chronic) diseases. Quitting smoking is hard, but it is one of the best things that you can do for your health. It is never too late to quit. What are the benefits of quitting smoking? When you  quit smoking, you lower your risk for getting serious diseases and conditions. They can include:  Lung cancer or lung disease.  Heart disease.  Stroke.  Heart attack.  Not being able to have children (infertility).  Weak bones (osteoporosis) and broken bones (fractures).  If you have coughing, wheezing, and shortness of breath, those symptoms may get better when you quit. You may also get sick less often. If you are pregnant, quitting smoking can help to lower your chances of having a baby of low birth weight. What can I do to help me quit smoking? Talk with your doctor about what can help you quit smoking. Some things you can do (strategies) include:  Quitting smoking totally, instead of slowly cutting back how much you smoke over a period of time.  Going to in-person counseling. You are more likely to quit if you go to many counseling sessions.  Using resources and support systems, such as: ? Database administrator with a Social worker. ? Phone quitlines. ? Careers information officer. ? Support groups or group counseling. ? Text messaging programs. ? Mobile phone apps or applications.  Taking medicines. Some of these medicines may have nicotine in them. If you are pregnant or breastfeeding, do not take any medicines to quit smoking unless your doctor says it is okay. Talk with your doctor about counseling or other things that can help you.  Talk with your  doctor about using more than one strategy at the same time, such as taking medicines while you are also going to in-person counseling. This can help make quitting easier. What things can I do to make it easier to quit? Quitting smoking might feel very hard at first, but there is a lot that you can do to make it easier. Take these steps:  Talk to your family and friends. Ask them to support and encourage you.  Call phone quitlines, reach out to support groups, or work with a Social worker.  Ask people who smoke to not smoke around you.  Avoid places that make you want (trigger) to smoke, such as: ? Bars. ? Parties. ? Smoke-break areas at work.  Spend time with people who do not smoke.  Lower the stress in your life. Stress can make you want to smoke. Try these things to help your stress: ? Getting regular exercise. ? Deep-breathing exercises. ? Yoga. ? Meditating. ? Doing a body scan. To do this, close your eyes, focus on one area of your body at a time from head to toe, and notice which parts of your body are tense. Try to relax the muscles in those areas.  Download or buy apps on your mobile phone or tablet that can help you stick to your quit plan. There are many free apps, such as QuitGuide from the [employer] Office manager for Disease Control and Prevention). You can find more support from smokefree.gov and other websites.  This information is not intended to replace advice given to you by your health care provider. Make sure you discuss any questions you have with your health care provider. Document Released: 12/22/2008 Document Revised: 10/24/2015 Document Reviewed: 07/12/2014 Elsevier Interactive Patient Education  2018 [employer]. ____________________________________________________________________________________________  Preparing for Procedure with Sedation Instructions: . Oral Intake: Do not eat or drink anything for at least 8 hours prior to your procedure. . Transportation: Public  transportation is not allowed. Bring an adult driver. The driver must be physically present in our waiting room before any procedure can be started. Marland Kitchen Physical Assistance: Bring an  adult physically capable of assisting you, in the event you need help. This adult should keep you company at home for at least 6 hours after the procedure. . Blood Pressure Medicine: Take your blood pressure medicine with a sip of water the morning of the procedure. . Blood thinners:  . Diabetics on insulin: Notify the staff so that you can be scheduled 1st case in the morning. If your diabetes requires high dose insulin, take only  of your normal insulin dose the morning of the procedure and notify the staff that you have done so. . Preventing infections: Shower with an antibacterial soap the morning of your procedure. . Build-up your immune system: Take 1000 mg of Vitamin C with every meal (3 times a day) the day prior to your procedure. Marland Kitchen Antibiotics: Inform the staff if you have a condition or reason that requires you to take antibiotics before dental procedures. . Pregnancy: If you are pregnant, call and cancel the procedure. . Sickness: If you have a cold, fever, or any active infections, call and cancel the procedure. . Arrival: You must be in the facility at least 30 minutes prior to your scheduled procedure. . Children: Do not bring children with you. . Dress appropriately: Bring dark clothing that you would not mind if they get stained. . Valuables: Do not bring any jewelry or valuables. Procedure appointments are reserved for interventional treatments only. Marland Kitchen No Prescription Refills. . No medication changes will be discussed during procedure appointments. . No disability issues will be discussed. ____________________________________________________________________________________________  Radiofrequency Lesioning Radiofrequency lesioning is a procedure that is performed to relieve pain. The procedure is  often used for back, neck, or arm pain. Radiofrequency lesioning involves the use of a machine that creates radio waves to make heat. During the procedure, the heat is applied to the nerve that carries the pain signal. The heat damages the nerve and interferes with the pain signal. Pain relief usually starts about 2 weeks after the procedure and lasts for 6 months to 1 year. Tell a health care provider about:  Any allergies you have.  All medicines you are taking, including vitamins, herbs, eye drops, creams, and over-the-counter medicines.  Any problems you or family members have had with anesthetic medicines.  Any blood disorders you have.  Any surgeries you have had.  Any medical conditions you have.  Whether you are pregnant or may be pregnant. What are the risks? Generally, this is a safe procedure. However, problems may occur, including:  Pain or soreness at the injection site.  Infection at the injection site.  Damage to nerves or blood vessels.  What happens before the procedure?  Ask your health care provider about: ? Changing or stopping your regular medicines. This is especially important if you are taking diabetes medicines or blood thinners. ? Taking medicines such as aspirin and ibuprofen. These medicines can thin your blood. Do not take these medicines before your procedure if your health care provider instructs you not to.  Follow instructions from your health care provider about eating or drinking restrictions.  Plan to have someone take you home after the procedure.  If you go home right after the procedure, plan to have someone with you for 24 hours. What happens during the procedure?  You will be given one or more of the following: ? A medicine to help you relax (sedative). ? A medicine to numb the area (local anesthetic).  You will be awake during the procedure. You will need to  be able to talk with the health care provider during the procedure.  With  the help of a type of X-ray (fluoroscopy), the health care provider will insert a radiofrequency needle into the area to be treated.  Next, a wire that carries the radio waves (electrode) will be put through the radiofrequency needle. An electrical pulse will be sent through the electrode to verify the correct nerve. You will feel a tingling sensation, and you may have muscle twitching.  Then, the tissue that is around the needle tip will be heated by an electric current that is passed using the radiofrequency machine. This will numb the nerves.  A bandage (dressing) will be put on the insertion area after the procedure is done. The procedure may vary among health care providers and hospitals. What happens after the procedure?  Your blood pressure, heart rate, breathing rate, and blood oxygen level will be monitored often until the medicines you were given have worn off.  Return to your normal activities as directed by your health care provider. This information is not intended to replace advice given to you by your health care provider. Make sure you discuss any questions you have with your health care provider. Document Released: 10/24/2010 Document Revised: 08/03/2015 Document Reviewed: 04/04/2014 Elsevier Interactive Patient Education  [employer].

## 2016-09-26 NOTE — Patient Instructions (Addendum)
Steps to Quit Smoking Smoking tobacco can be bad for your health. It can also affect almost every organ in your body. Smoking puts you and people around you at risk for many serious long-lasting (chronic) diseases. Quitting smoking is hard, but it is one of the best things that you can do for your health. It is never too late to quit. What are the benefits of quitting smoking? When you quit smoking, you lower your risk for getting serious diseases and conditions. They can include:  Lung cancer or lung disease.  Heart disease.  Stroke.  Heart attack.  Not being able to have children (infertility).  Weak bones (osteoporosis) and broken bones (fractures).  If you have coughing, wheezing, and shortness of breath, those symptoms may get better when you quit. You may also get sick less often. If you are pregnant, quitting smoking can help to lower your chances of having a baby of low birth weight. What can I do to help me quit smoking? Talk with your doctor about what can help you quit smoking. Some things you can do (strategies) include:  Quitting smoking totally, instead of slowly cutting back how much you smoke over a period of time.  Going to in-person counseling. You are more likely to quit if you go to many counseling sessions.  Using resources and support systems, such as: ? Online chats with a counselor. ? Phone quitlines. ? Printed self-help materials. ? Support groups or group counseling. ? Text messaging programs. ? Mobile phone apps or applications.  Taking medicines. Some of these medicines may have nicotine in them. If you are pregnant or breastfeeding, do not take any medicines to quit smoking unless your doctor says it is okay. Talk with your doctor about counseling or other things that can help you.  Talk with your doctor about using more than one strategy at the same time, such as taking medicines while you are also going to in-person counseling. This can help make  quitting easier. What things can I do to make it easier to quit? Quitting smoking might feel very hard at first, but there is a lot that you can do to make it easier. Take these steps:  Talk to your family and friends. Ask them to support and encourage you.  Call phone quitlines, reach out to support groups, or work with a counselor.  Ask people who smoke to not smoke around you.  Avoid places that make you want (trigger) to smoke, such as: ? Bars. ? Parties. ? Smoke-break areas at work.  Spend time with people who do not smoke.  Lower the stress in your life. Stress can make you want to smoke. Try these things to help your stress: ? Getting regular exercise. ? Deep-breathing exercises. ? Yoga. ? Meditating. ? Doing a body scan. To do this, close your eyes, focus on one area of your body at a time from head to toe, and notice which parts of your body are tense. Try to relax the muscles in those areas.  Download or buy apps on your mobile phone or tablet that can help you stick to your quit plan. There are many free apps, such as QuitGuide from the CDC (Centers for Disease Control and Prevention). You can find more support from smokefree.gov and other websites.  This information is not intended to replace advice given to you by your health care provider. Make sure you discuss any questions you have with your health care provider. Document Released: 12/22/2008 Document   Revised: 10/24/2015 Document Reviewed: 07/12/2014 Elsevier Interactive Patient Education  2018 [employer]. ____________________________________________________________________________________________  Preparing for Procedure with Sedation Instructions: . Oral Intake: Do not eat or drink anything for at least 8 hours prior to your procedure. . Transportation: Public transportation is not allowed. Bring an adult driver. The driver must be physically present in our waiting room before any procedure can be  started. [place] Physical Assistance: Bring an adult physically capable of assisting you, in the event you need help. This adult should keep you company at home for at least 6 hours after the procedure. . Blood Pressure Medicine: Take your blood pressure medicine with a sip of water the morning of the procedure. . Blood thinners:  . Diabetics on insulin: Notify the staff so that you can be scheduled 1st case in the morning. If your diabetes requires high dose insulin, take only  of your normal insulin dose the morning of the procedure and notify the staff that you have done so. . Preventing infections: Shower with an antibacterial soap the morning of your procedure. . Build-up your immune system: Take 1000 mg of Vitamin C with every meal (3 times a day) the day prior to your procedure. [place] Antibiotics: Inform the staff if you have a condition or reason that requires you to take antibiotics before dental procedures. . Pregnancy: If you are pregnant, call and cancel the procedure. . Sickness: If you have a cold, fever, or any active infections, call and cancel the procedure. . Arrival: You must be in the facility at least 30 minutes prior to your scheduled procedure. . Children: Do not bring children with you. . Dress appropriately: Bring dark clothing that you would not mind if they get stained. . Valuables: Do not bring any jewelry or valuables. Procedure appointments are reserved for interventional treatments only. [place] [place] No Prescription Refills. . No medication changes will be discussed during procedure appointments. . No disability issues will be discussed. ____________________________________________________________________________________________  Radiofrequency Lesioning Radiofrequency lesioning is a procedure that is performed to relieve pain. The procedure is often used for back, neck, or arm pain. Radiofrequency lesioning involves the use of a machine that creates radio waves to make heat. During  the procedure, the heat is applied to the nerve that carries the pain signal. The heat damages the nerve and interferes with the pain signal. Pain relief usually starts about 2 weeks after the procedure and lasts for 6 months to 1 year. Tell a health care provider about:  Any allergies you have.  All medicines you are taking, including vitamins, herbs, eye drops, creams, and over-the-counter medicines.  Any problems you or family members have had with anesthetic medicines.  Any blood disorders you have.  Any surgeries you have had.  Any medical conditions you have.  Whether you are pregnant or may be pregnant. What are the risks? Generally, this is a safe procedure. However, problems may occur, including:  Pain or soreness at the injection site.  Infection at the injection site.  Damage to nerves or blood vessels.  What happens before the procedure?  Ask your health care provider about: ? Changing or stopping your regular medicines. This is especially important if you are taking diabetes medicines or blood thinners. ? Taking medicines such as aspirin and ibuprofen. These medicines can thin your blood. Do not take these medicines before your procedure if your health care provider instructs you not to.  Follow instructions from your health care provider about eating or drinking restrictions.  Plan  to have someone take you home after the procedure.  If you go home right after the procedure, plan to have someone with you for 24 hours. What happens during the procedure?  You will be given one or more of the following: ? A medicine to help you relax (sedative). ? A medicine to numb the area (local anesthetic).  You will be awake during the procedure. You will need to be able to talk with the health care provider during the procedure.  With the help of a type of X-ray (fluoroscopy), the health care provider will insert a radiofrequency needle into the area to be treated.  Next,  a wire that carries the radio waves (electrode) will be put through the radiofrequency needle. An electrical pulse will be sent through the electrode to verify the correct nerve. You will feel a tingling sensation, and you may have muscle twitching.  Then, the tissue that is around the needle tip will be heated by an electric current that is passed using the radiofrequency machine. This will numb the nerves.  A bandage (dressing) will be put on the insertion area after the procedure is done. The procedure may vary among health care providers and hospitals. What happens after the procedure?  Your blood pressure, heart rate, breathing rate, and blood oxygen level will be monitored often until the medicines you were given have worn off.  Return to your normal activities as directed by your health care provider. This information is not intended to replace advice given to you by your health care provider. Make sure you discuss any questions you have with your health care provider. Document Released: 10/24/2010 Document Revised: 08/03/2015 Document Reviewed: 04/04/2014 Elsevier Interactive Patient Education  [employer].

## 2016-10-10 MED FILL — METOPROLOL TARTRATE 50 MG T: 50 | 30 days supply | Qty: 90 | Fill #0

## 2016-10-15 MED FILL — traMADol HCL 50 MG TABS: 50 | 30 days supply | Qty: 240 | Fill #1

## 2016-11-06 MED FILL — !VENTOLIN HFA INHALER: 108 (90 BAS | 16 days supply | Qty: 18 | Fill #1

## 2016-11-06 MED FILL — traZODone HCL 100 MG TABS: 100 | 30 days supply | Qty: 30 | Fill #1

## 2016-11-06 MED FILL — GABAPENTIN 800 MG TABLET: 800 | 30 days supply | Qty: 120 | Fill #0

## 2016-11-06 MED FILL — ?METOPROLOL 50 MG TABLET: 50 | 30 days supply | Qty: 90 | Fill #1

## 2016-11-06 MED FILL — ?CYCLOBENZAPRINE 10 MG TABL: 10 | 30 days supply | Qty: 30 | Fill #1

## 2016-11-12 ENCOUNTER — Encounter (HOSPITAL_COMMUNITY): Payer: Self-pay | Admitting: Psychiatry

## 2016-11-12 ENCOUNTER — Ambulatory Visit (INDEPENDENT_AMBULATORY_CARE_PROVIDER_SITE_OTHER): Payer: Self-pay | Admitting: Psychiatry

## 2016-11-12 VITALS — BP 126/80 | HR 67 | Ht 76.0 in | Wt 208.8 lb

## 2016-11-12 DIAGNOSIS — G8929 Other chronic pain: Secondary | ICD-10-CM

## 2016-11-12 DIAGNOSIS — Z811 Family history of alcohol abuse and dependence: Secondary | ICD-10-CM

## 2016-11-12 DIAGNOSIS — F17210 Nicotine dependence, cigarettes, uncomplicated: Secondary | ICD-10-CM

## 2016-11-12 DIAGNOSIS — F411 Generalized anxiety disorder: Secondary | ICD-10-CM

## 2016-11-12 MED ORDER — GUANFACINE HCL 1 MG PO TABS: 1.0000 mg | ORAL_TABLET | Freq: Three times a day (TID) | ORAL | 2 refills | Status: DC

## 2016-11-12 MED ORDER — NORTRIPTYLINE HCL 25 MG PO CAPS: 25.0000 mg | ORAL_CAPSULE | Freq: Every day | ORAL | 1 refills | Status: DC

## 2016-11-12 MED FILL — guanFACINE HCL 1 MG TABS: 1 | 30 days supply | Qty: 90 | Fill #0

## 2016-11-12 MED FILL — NORTRIPTYLINE HCL 25 MG CAP: 25 | 30 days supply | Qty: 60 | Fill #0

## 2016-11-12 NOTE — Progress Notes (Signed)
[facility] MD/PA/NP OP Progress Note  11/12/2016 3:31 PM Mark [name]  MRN:  [id number]  Chief Complaint: med check  HPI: Mark [name] presents for med management. He never started the Remeron, because he realized he had taken it in the past and it was not effective. He continues to struggle with daily pain in his legs, insomnia, depression, anxiety. Spent time discussing nortriptyline, and discontinuing trazodone. We also discussed using guanfacine 0.5-1 mg tablet 3 times daily for anxiety, and we reviewed the risks and benefits. He has previously been on Elavil with some benefit, so we agreed to titrate Pamelor gradually to 50 mg in a couple weeks.  He continues to spend much of our interaction focusing on his pain, headaches, GI upset. He does have considerable contributors for pain, but remains somatically preoccupied throughout our interaction. He continues to express anger and resentment towards his pain providers, feeling that they are not addressing his needs. I encouraged him to consider additional alternative strategies, and he reports that he has exhausted "every single treatment" and he is hopeful that they will do surgery to correct his neck pain.  Visit Diagnosis:    ICD-10-CM   1. Anxiety state F41.1 guanFACINE (TENEX) 1 MG tablet    Past Psychiatric History: See intake H&P for full details. Reviewed, with no updates at this time.   Past Medical History:  Past Medical History:  Diagnosis Date  . Alcohol withdrawal ([place])   . Allergy   . Anxiety Dx 2014  . COPD (chronic obstructive pulmonary disease) ([place])   . Drug abuse    pt reports opioid dependence due to previous back surgeries  . Enlarged prostate   . ETOH abuse   . GERD (gastroesophageal reflux disease) Dx 2003  . Headache(784.0)   . Hepatitis   . Hyperlipidemia Dx 2000  . Hypertension Dx 2011  . IBS (irritable bowel syndrome)    alternates constiaption/ diarrhea  . Irregular heart beat   . Long Q-T syndrome  01/23/2014  . Mental disorder   . Neuromuscular disorder ([place])    hands hurt   . Neuropathy 06/07/2013  . Seizure due to alcohol withdrawal ([place]) 01/23/2014   Per patient report  . Shortness of breath   . Withdrawal seizures ([place]) 2014   last seizure 2014 etoh with drawal    Past Surgical History:  Procedure Laterality Date  . [place] STUDY N/A 01/16/2015   Procedure: [place] STUDY;  Surgeon: Manus Gunning, MD;  Location: [facility];  Service: Gastroenterology;  Laterality: N/A;  . [place]  . COLONOSCOPY  1998  . LEFT HEART CATHETERIZATION WITH CORONARY ANGIOGRAM N/A 01/13/2014   Procedure: LEFT HEART CATHETERIZATION WITH CORONARY ANGIOGRAM;  Surgeon: Clent Demark, MD;  Location: [facility];  Service: Cardiovascular;  Laterality: N/A;  . NASAL SINUS SURGERY    . NECK SURGERY  2000   cervial fusion   . NISSEN FUNDOPLICATION    . SUBACROMIAL DECOMPRESSION Right 11/16/2014   Procedure: RIGHT SHOULDER ARTHROSCOPY WITH SUBACROMIAL DECOMPRESSION ;  Surgeon: Leandrew Koyanagi, MD;  Location: [facility];  Service: Orthopedics;  Laterality: Right;  . UPPER GASTROINTESTINAL ENDOSCOPY      Family Psychiatric History: See intake H&P for full details. Reviewed, with no updates at this time.   Family History:  Family History  Problem Relation Age of Onset  . Lung cancer Father        was a smoker  .  Alcohol abuse Father   . Hypertension Mother   . Colon polyps Mother   . Breast cancer Paternal Grandmother   . Alcohol abuse Paternal Grandfather   . Colon cancer Neg Hx   . Rectal cancer Neg Hx   . Stomach cancer Neg Hx     Social History:  Social History   Social History  . Marital status: Divorced    Spouse name: N/A  . Number of children: 2  . Years of education: N/A   Occupational History  . [employer]     Social History Main Topics  . Smoking status: Current Every Day Smoker    Packs/day: 0.50    Years: 30.00    Types:  Cigarettes  . Smokeless tobacco: Never Used     Comment: 11-30-2014 info given, Has cut back from 2 pks to 1/2 pack and "working on it"  . Alcohol use No     Comment: occ; binge drinking occasionally  . Drug use: No     Comment: Opana  . Sexual activity: Not Currently   Other Topics Concern  . None   Social History Narrative  . None    Allergies:  Allergies  Allergen Reactions  . Cozaar [Losartan] Anaphylaxis  . Lisinopril Anaphylaxis    Metabolic Disorder Labs: Lab Results  Component Value Date   HGBA1C 6.0 04/03/2015   MPG 128 (H) 01/22/2014   No results found for: PROLACTIN No results found for: CHOL, TRIG, HDL, CHOLHDL, VLDL, LDLCALC Lab Results  Component Value Date   TSH 1.576 07/04/2014   TSH 0.581 01/22/2014    Therapeutic Level Labs: No results found for: LITHIUM No results found for: VALPROATE No components found for:  CBMZ  Current Medications: Current Outpatient Prescriptions  Medication Sig Dispense Refill  . cyclobenzaprine (FLEXERIL) 10 MG tablet Take 1 tablet (10 mg total) by mouth at bedtime. 30 tablet 2  . gabapentin (NEURONTIN) 800 MG tablet Take 1 tablet (800 mg total) by mouth 4 (four) times daily. 360 tablet 1  . guanFACINE (TENEX) 1 MG tablet Take 1 tablet (1 mg total) by mouth 3 (three) times daily. 90 tablet 2  . metoprolol (LOPRESSOR) 50 MG tablet Take 1 tablet (50 mg total) by mouth 2 (two) times daily. 60 tablet 11  . nortriptyline (PAMELOR) 25 MG capsule Take 1 capsule (25 mg total) by mouth at bedtime. Increase to 50 mg nightly as tolerated 90 capsule 1  . traMADol (ULTRAM) 50 MG tablet Take 2 tablets (100 mg total) by mouth 4 (four) times daily. 1-2 tablets 240 tablet 2  . VENTOLIN HFA 108 (90 Base) MCG/ACT inhaler INHALE 2 PUFFS INTO THE LUNGS EVERY 4 HOURS AS NEEDED FOR WHEEZING OR SHORTNESS OF BREATH. 18 g 1   No current facility-administered medications for this visit.      Musculoskeletal: Strength & Muscle Tone: within  normal limits Gait & Station: normal Patient leans: N/A  Psychiatric Specialty Exam: ROS  Blood pressure 126/80, pulse 67, height 6\' 4"  (1.93 m), weight 208 lb 12.8 oz (94.7 kg).Body mass index is 25.42 kg/m.  General Appearance: Casual and Fairly Groomed  Eye Contact:  Good  Speech:  Clear and Coherent  Volume:  Normal  Mood:  Depressed and Dysphoric  Affect:  Appropriate and Congruent  Thought Process:  Goal Directed  Orientation:  Full (Time, Place, and Person)  Thought Content: Logical   Suicidal Thoughts:  No  Homicidal Thoughts:  No  Memory:  Immediate;   Good  Judgement:  Good  Insight:  Fair  Psychomotor Activity:  Normal  Concentration:  Concentration: Fair  Recall:  [place] of Knowledge: Good  Language: Good  Akathisia:  Negative  Handed:  Right  AIMS (if indicated): not done  Assets:  Communication Skills Desire for Improvement Financial Resources/Insurance Housing Transportation  ADL's:  Intact  Cognition: WNL  Sleep:  Fair   Screenings: AUDIT     Admission (Discharged) from 02/03/2014 in [facility] Admission (Discharged) from 02/02/2014 in [facility] Admission (Discharged) from 01/22/2014 in [facility]  Alcohol Use Disorder Identification Test Final Score (AUDIT)  24  24  20     GAD-7     Office Visit from 12/15/2015 in [facility] Office Visit from 07/24/2015 in [facility] Office Visit from 04/03/2015 in [facility] Office Visit from 02/13/2015 in [facility]  Total GAD-7 Score  10  16  9  16     PHQ2-9     Office Visit from 09/26/2016 in [facility] Procedure visit from 08/26/2016 in [facility] Office Visit from 08/22/2016 in [facility] Procedure visit from 07/29/2016 in [facility] Office Visit from 07/22/2016 in [facility]  PHQ-2 Total Score  0  0  0  0  0       Assessment and Plan: Mark [name] is a 51 year old male with a history of substance abuse and chronic pain, who presents today for medication management follow-up. He presents with ongoing insomnia, psychophysiologic in nature, chronic pain, headaches, somatic complaints. We agreed to proceed as below and follow up in 3 months.    1. Anxiety state    Remeron not tried, patient reports he had taken this in the past Discontinue trazodone as tolerated Initiate nortriptyline 25 mg nightly, increased to 50 mg nightly as tolerated 2 weeks Tenex 1 mg 3 times daily for anxiety Return to clinic in 2-3 months  Aundra Dubin, MD 11/12/2016, 3:31 PM

## 2016-11-13 MED FILL — traMADol HCL 50 MG TABS: 50 | 30 days supply | Qty: 240 | Fill #2

## 2016-11-20 ENCOUNTER — Encounter (HOSPITAL_COMMUNITY): Payer: Self-pay | Admitting: Licensed Clinical Social Worker

## 2016-11-20 ENCOUNTER — Ambulatory Visit (INDEPENDENT_AMBULATORY_CARE_PROVIDER_SITE_OTHER): Payer: Self-pay | Admitting: Licensed Clinical Social Worker

## 2016-11-20 DIAGNOSIS — F319 Bipolar disorder, unspecified: Secondary | ICD-10-CM

## 2016-11-20 NOTE — Progress Notes (Signed)
   THERAPIST PROGRESS NOTE  Session Time: 3:40pm-4:30pm  Participation Level: Active  Behavioral Response: NeatAlertDepressed  Type of Therapy: Individual therapy  Treatment Goals addressed: Improve psychiatric symptoms, elevate mood (increased energy, increased activity, improved concentration, improved sleep), Improve unhelpful thought patterns,  Mood stabilization (improved mood, decreased anxiety, decreased depression), stress management skills (decreased restlessness, decreased anxiety), learn about diagnosis, healthy coping skills  Interventions: motivational interviewing, cbt, grounding and mindfulness techniques, psychoeducation  Summary: Mark [name] is a 51 y.o. 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 Mark [name] discussed his psychiatric symptoms, his current life events, and his homework. Mark [name]  shared concern about current medications not working. Nurse consulted with Dr. Daron Offer who adjusted medication and provided new instructions for medications. Mark [name] reported the birth of his granddaughter has become a significant source of joy and responsibility in his life. Mark [name] reports he is the primary caregiver for the baby while his daughter is at work. Mark [name] reports improvement in relationships with family members. However, Mark [name] also reports ongoing and increasing pain, which increases depression sxs. Mark [name] reported no alcohol use. Clinician explored use of acupuncture as an alternative, as well as meditation and deep breathing. Clinician shared breathing and meditation technique in order to assist Mark [name] with relaxation when depression or anxiety sxs arise.    Plan: Return in 1-2 weeks.  Diagnosis: Axis I: Bipolar I disorder, Alcohol use disorder, severe in early remission   Mindi Curling, LCSW 11/20/2016

## 2016-12-01 IMAGING — RF DG FLUORO GUIDE NDL PLC/BX
3 series · 3 of 3 positions shown · non-contrast
Comparison: none

CLINICAL DATA: Right shoulder pain.

[Series 1: (hospital) · 1 of 1 slices shown (1 of 3)]
[im 1/1]
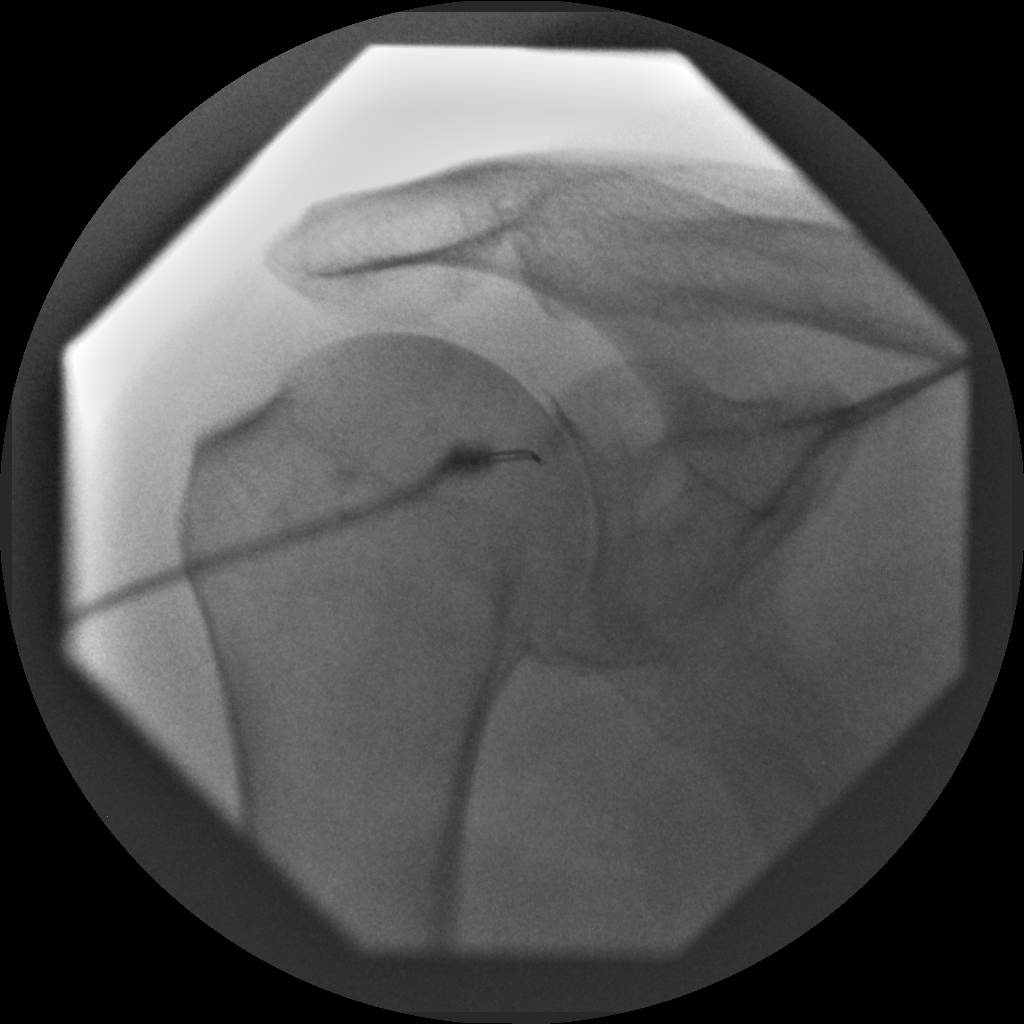

[Series 2: (hospital) · 1 of 1 slices shown (2 of 3)]
[im 1/1]
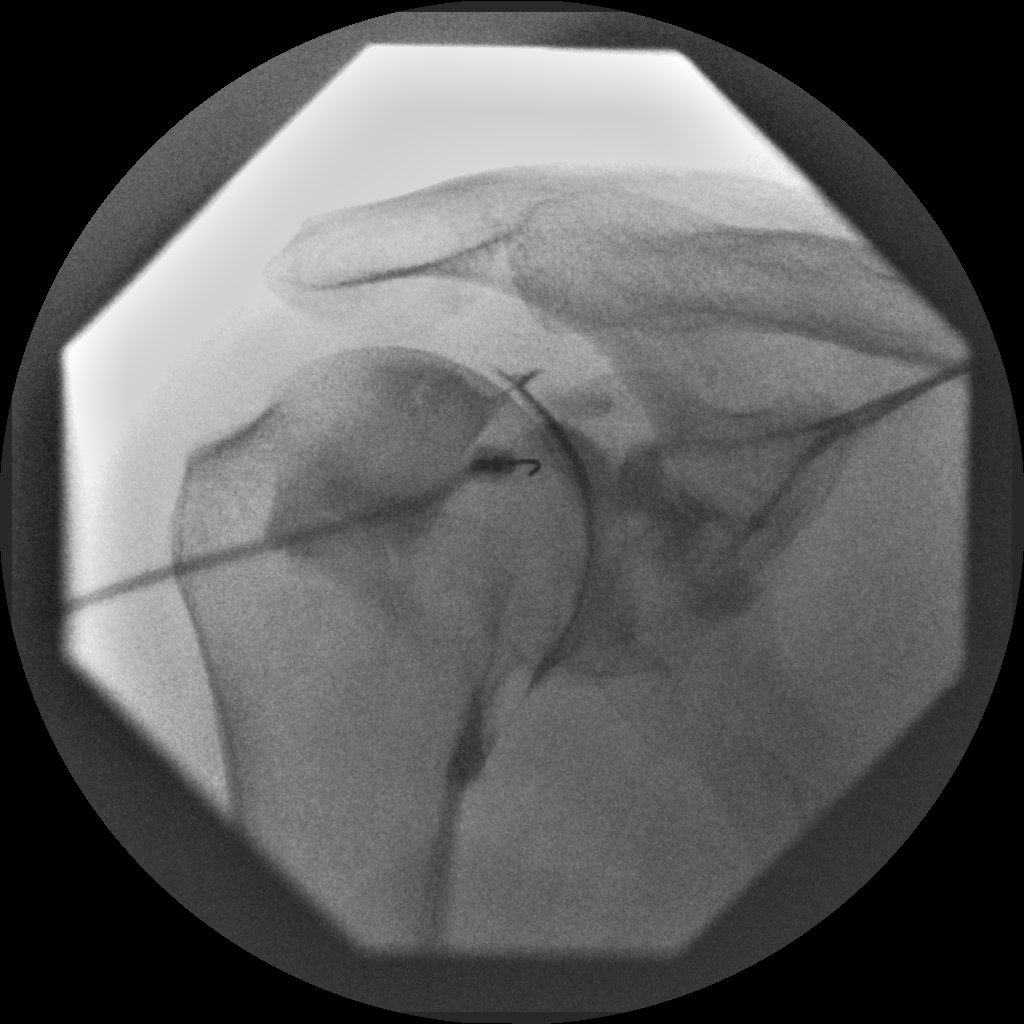

[Series 3: (hospital) · 1 of 1 slices shown (3 of 3)]
[im 1/1]
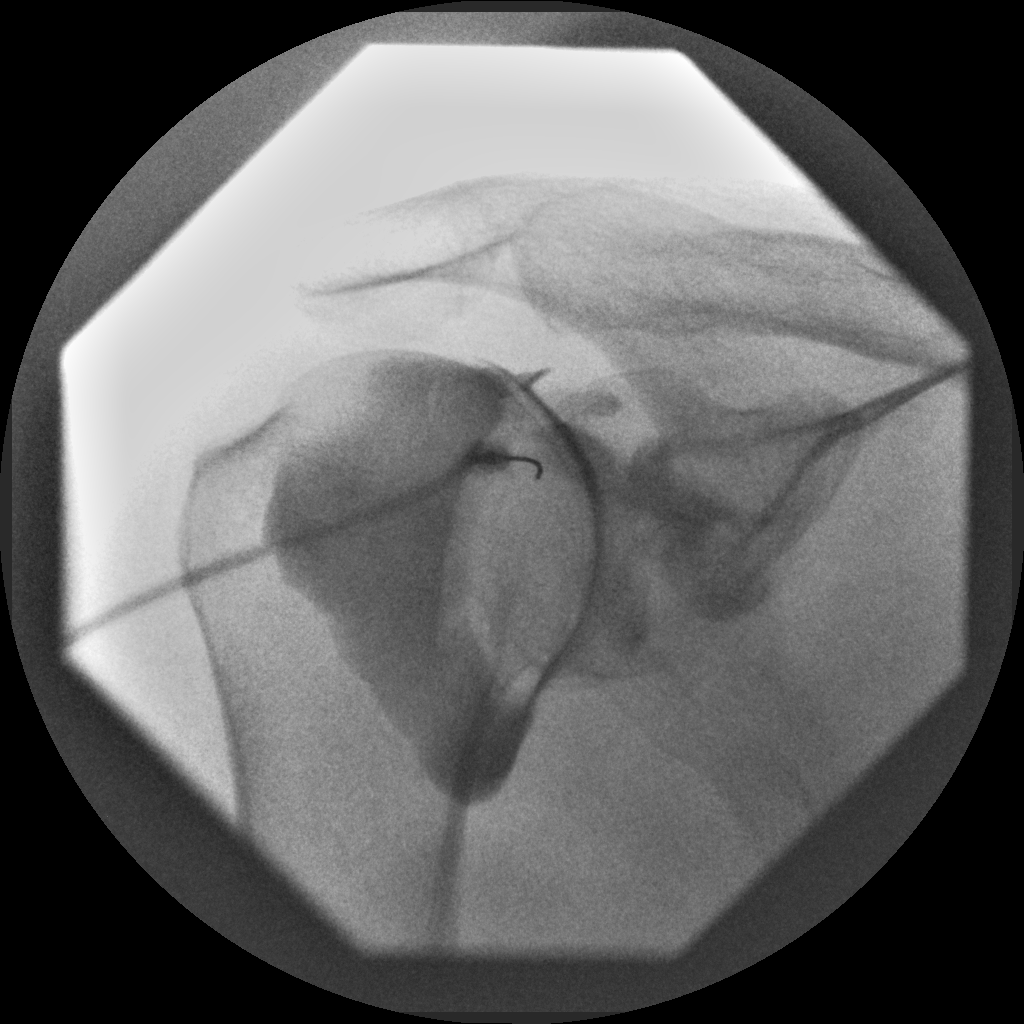

[3 of 3 positions shown; findings below may reference images not displayed]

FLUOROSCOPY TIME:  Fluoroscopy Time (in minutes and seconds): 0
minutes 20 seconds

Number of Acquired Images:  0

PROCEDURE:
RIGHT SHOULDER INJECTION UNDER FLUOROSCOPY

An appropriate skin entrance site was determined. The site was
marked, prepped with Betadine, draped in the usual sterile fashion,
and infiltrated locally with buffered Lidocaine. 22 gauge spinal
needle was advanced to the superomedial margin of the humeral head
under intermittent fluoroscopy. 1 ml of Lidocaine injected easily. A
mixture of 0.1 mL of MultiHance, 5 mL of 1% lidocaine, and 15 mL of
Omnipaque 180 was then used to opacify the right shoulder capsule.
No immediate complication.
IMPRESSION: Technically successful right shoulder injection for MRI.

## 2016-12-01 IMAGING — MR MR SHOULDER*R* W/CM
4 of 6 series · 18 of 40 positions shown · IV contrast (agent unspecified)
Comparison: None.

CLINICAL DATA: Fall on the RIGHT shoulder 6 months ago. Numbness
down the RIGHT arm. Limited range of motion. One previous cortisone
injection.

EXAM:
MR ARTHROGRAM OF THE RIGHT SHOULDER
TECHNIQUE: Multiplanar, multisequence MR imaging of the RIGHT shoulder was
performed following the administration of intra-articular contrast.
CONTRAST:  See Injection Documentation.

[Series 2: T1 fat-sat · axial · 4.0mm · 0.22mm/px · z∈[-35,+48]mm · 5 of 20 slices shown (1 of 2)]
[im 1/20]
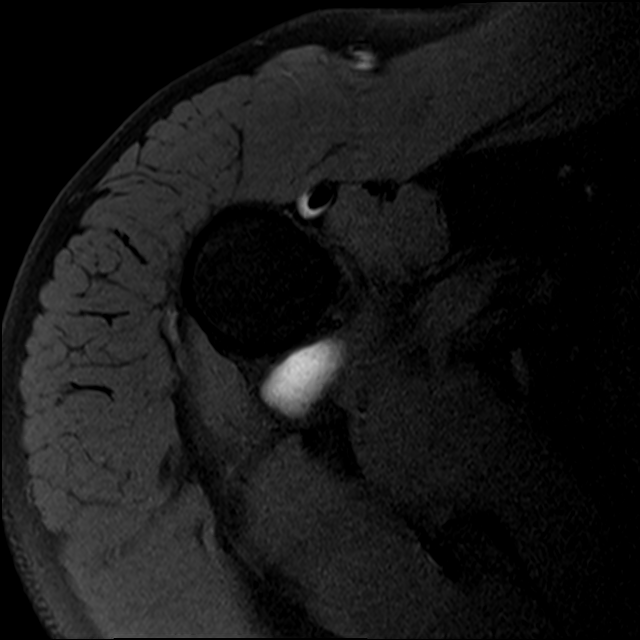
[im 4/20]
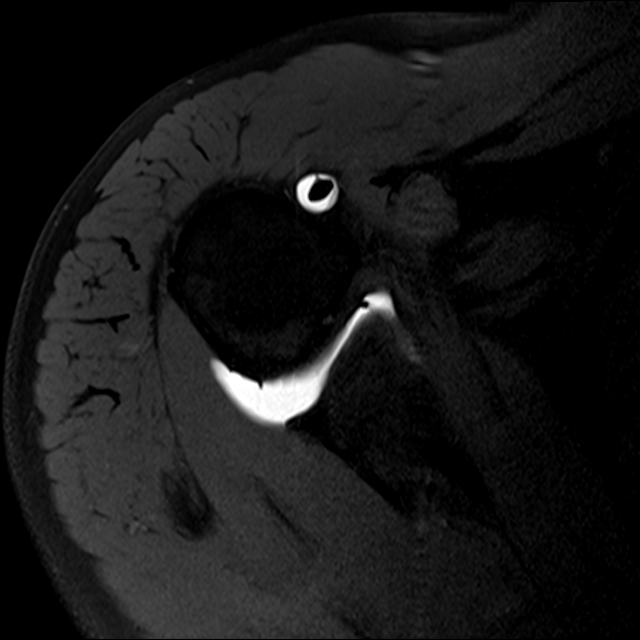
[im 8/20]
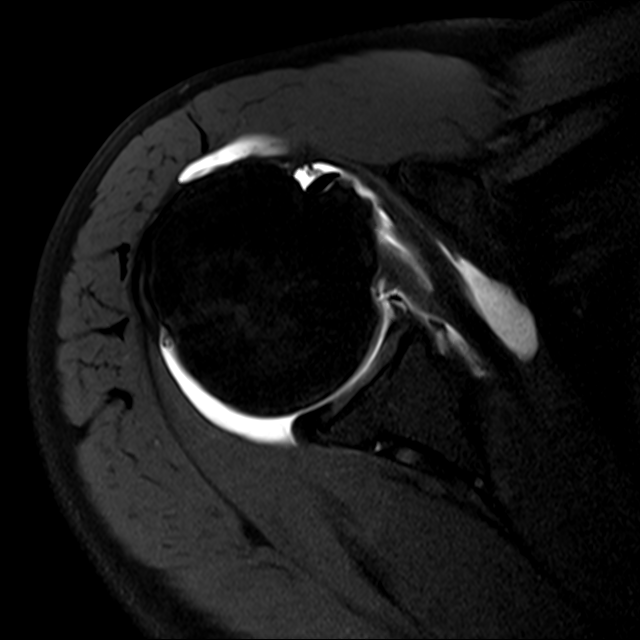
[im 12/20]
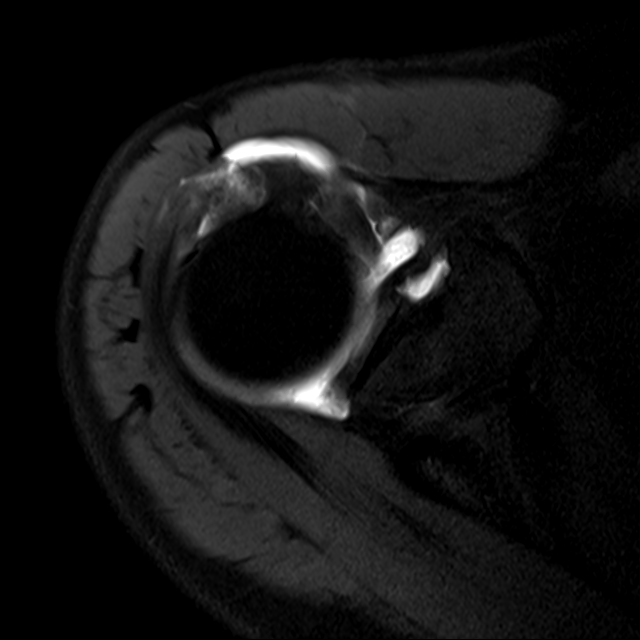
[im 20/20]
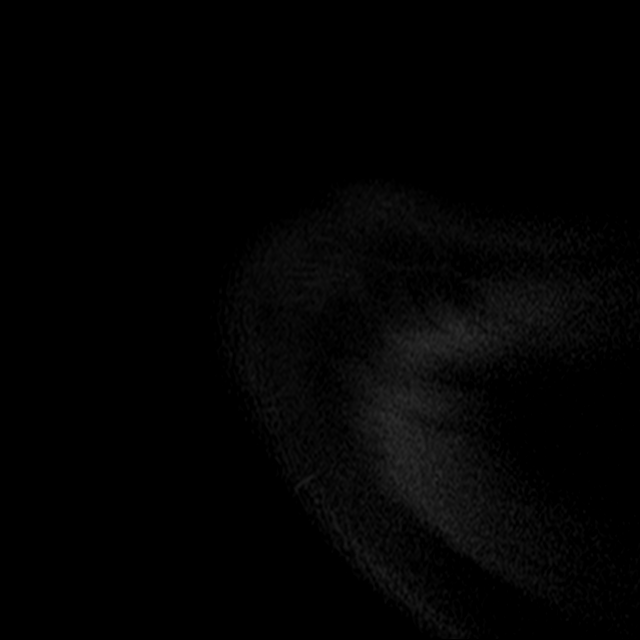

[Series 3: T1 fat-sat · oblique · 4.0mm · 0.22mm/px · 3 of 20 slices shown (2 of 2)]
[im 4/20]
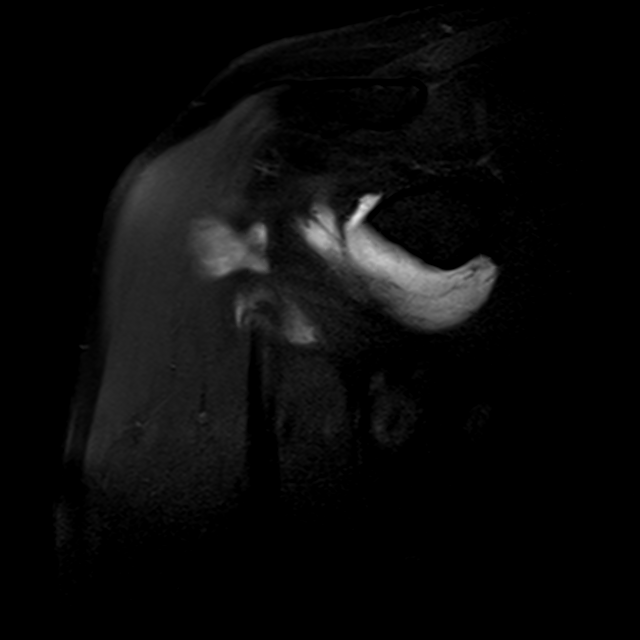
[im 10/20]
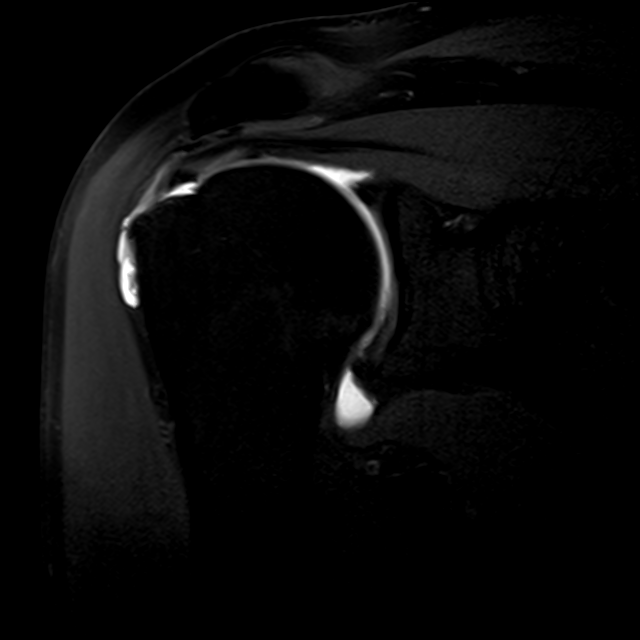
[im 16/20]
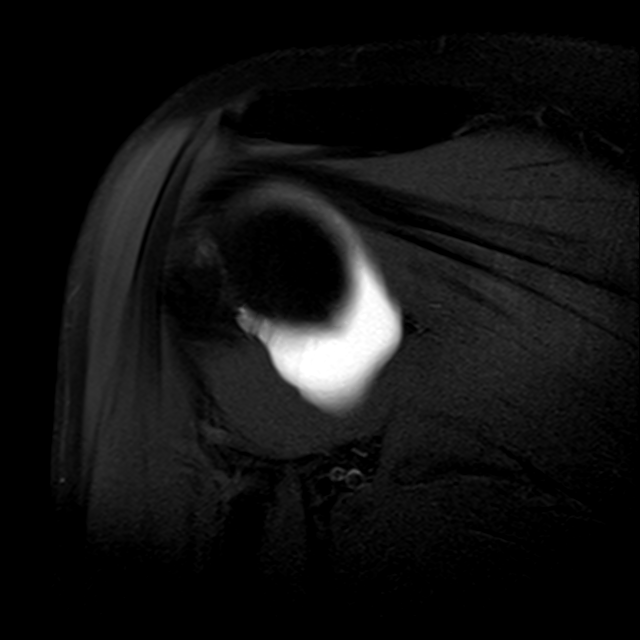

[Series 5: T2 fat-sat · oblique · 4.0mm · 0.44mm/px · 7 of 20 slices shown]
[im 1/20]
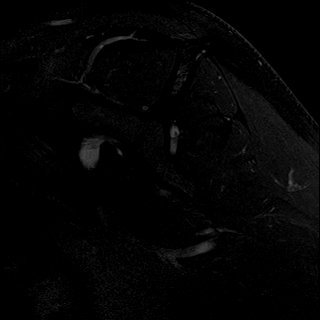
[im 4/20]
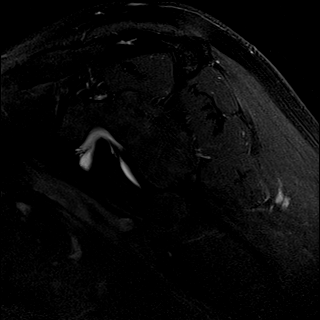
[im 7/20]
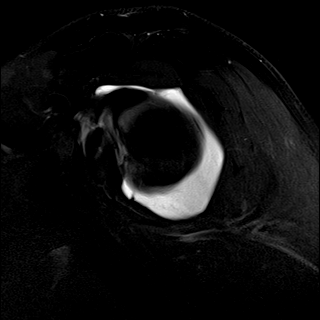
[im 10/20]
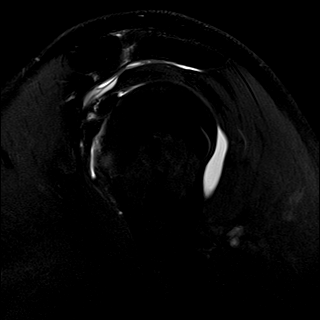
[im 13/20]
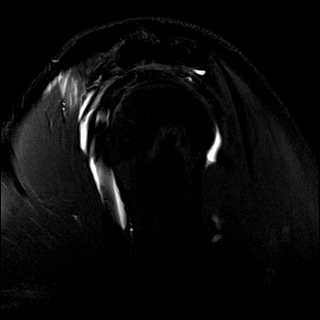
[im 16/20]
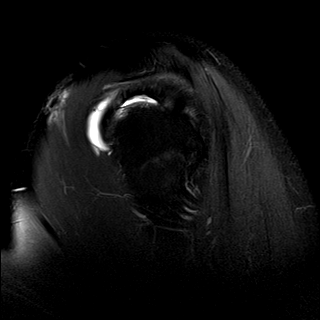
[im 20/20]
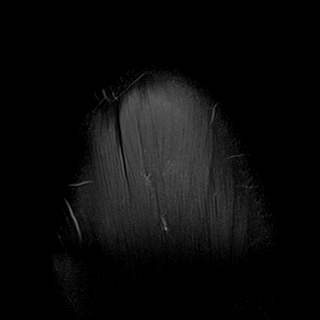

[Series 7: T1 · oblique · 4.0mm · 0.22mm/px · 3 of 20 slices shown]
[im 4/20]
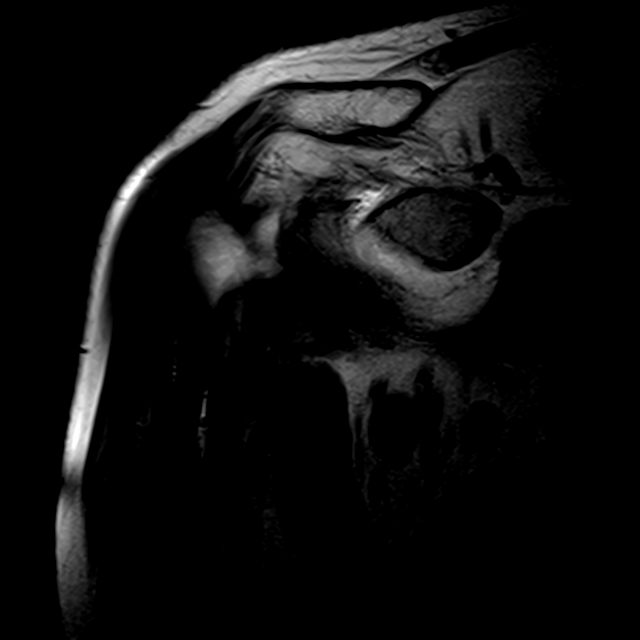
[im 10/20]
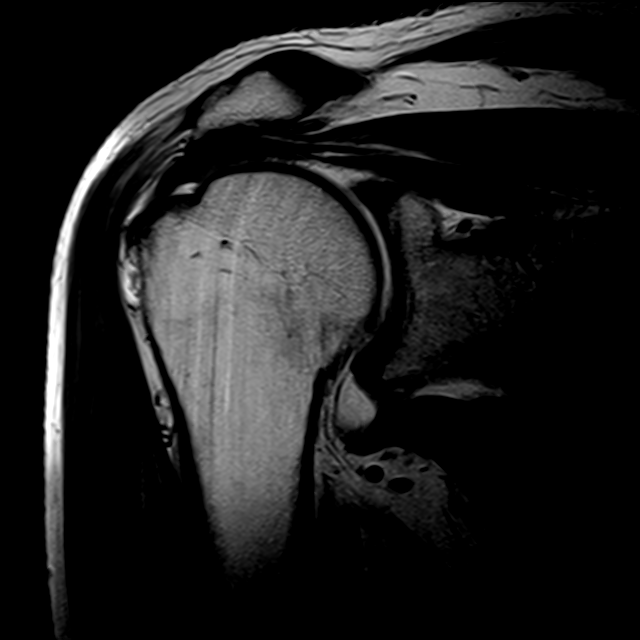
[im 16/20]
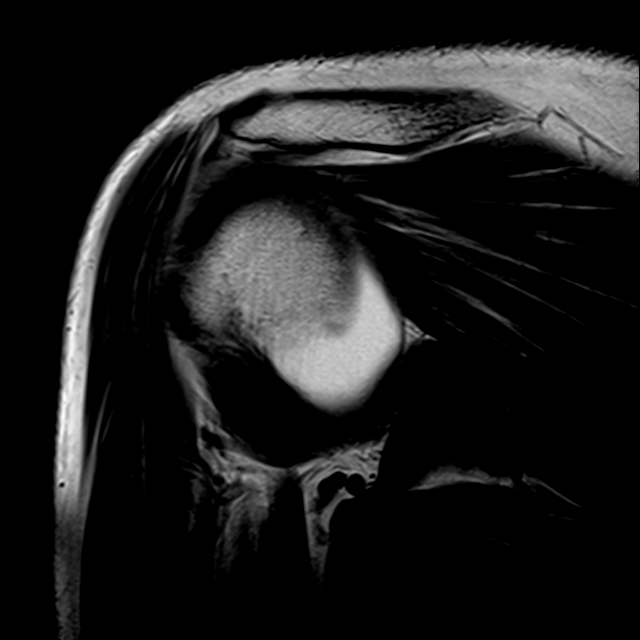

[18 of 40 positions shown; findings below may reference images not displayed]

FINDINGS: Rotator cuff: Extravasation of contrast from the glenohumeral joint
into the subacromial/subdeltoid bursa. At the insertion of
SUPRASPINATUS, there is a bursal surface tear that extend nearly
full-thickness. A small full-thickness perforation extending to the
articular surface is likely present accounting for the contrast in
the subacromial/subdeltoid bursa. The width of the tear measures 18
mm on sagittal imaging. Maximal retraction is 5 mm. Fraying of the
undersurface of SUPRASPINATUS.

The INFRASPINATUS tendon and teres minor tendons are intact.
INFRASPINATUS tendinopathy. Longitudinal split tear of the sub
scapularis tendon is present that communicates with the bicipital
groove (image 13 series 2).

Muscles: Normal.

Biceps long head: Normal.

Acromioclavicular Joint: Type 2 acromion. Mild AC joint
osteoarthritis.

Glenohumeral Joint: Mild synovitis in the rotator interval.
Glenohumeral ligaments are intact.

Labrum: Abduction external rotation view was performed but is
significantly degraded by motion artifact. On the other image sets,
the glenoid labrum appears intact.

Bones: No significant extra-articular findings.
IMPRESSION: 1. SUPRASPINATUS tendon bursal surface partial width tendon tear
with a small full-thickness perforation extending to the articular
surface accounting extravasation of contrast into the
subacromial/subdeltoid bursa.
2. Longitudinal split tear of the distal SUBSCAPULARIS tendon.
3. Intact glenoid labrum and glenohumeral ligaments.

## 2016-12-11 ENCOUNTER — Encounter: Payer: Self-pay | Admitting: Nurse Practitioner

## 2016-12-11 ENCOUNTER — Ambulatory Visit (HOSPITAL_COMMUNITY): Payer: Self-pay | Admitting: Licensed Clinical Social Worker

## 2016-12-11 MED FILL — GABAPENTIN 800 MG TABLET: 800 | 30 days supply | Qty: 120 | Fill #1

## 2016-12-11 MED FILL — ?METOPROLOL 50 MG TABLET: 50 | 30 days supply | Qty: 90 | Fill #2

## 2016-12-11 NOTE — Progress Notes (Unsigned)
   THERAPIST PROGRESS NOTE  Session Time: ***  Participation Level: {[facility] PARTICIPATION LEVEL:22264}  Behavioral Response: {Appearance:22683}{[facility] LEVEL OF CONSCIOUSNESS:22305}{[facility] MOOD:22306}  Type of Therapy: Individual therapy  Treatment Goals addressed: Improve psychiatric symptoms, elevate mood (increased energy, increased activity, improved concentration, improved sleep), Improve unhelpful thought patterns,  Mood stabilization (improved mood, decreased anxiety, decreased depression), stress management skills (decreased restlessness, decreased anxiety), learn about diagnosis, healthy coping skills  Interventions: motivational interviewing, cbt, grounding and mindfulness techniques, psychoeducation  Summary: Mark [name] is a 50 y.o. 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 [name] discussed his psychiatric symptoms, his current life events, and his homework. [name]  shared   Plan: Return in 1-2 weeks.  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  

## 2016-12-13 ENCOUNTER — Ambulatory Visit: Payer: Self-pay

## 2016-12-23 ENCOUNTER — Ambulatory Visit: Payer: Self-pay | Attending: Nurse Practitioner | Admitting: Nurse Practitioner

## 2016-12-23 ENCOUNTER — Encounter: Payer: Self-pay | Admitting: Nurse Practitioner

## 2016-12-23 VITALS — BP 122/88 | HR 66 | Temp 97.8°F | Resp 16 | Ht 76.0 in | Wt 215.0 lb

## 2016-12-23 DIAGNOSIS — M25551 Pain in right hip: Secondary | ICD-10-CM | POA: Insufficient documentation

## 2016-12-23 DIAGNOSIS — M542 Cervicalgia: Secondary | ICD-10-CM | POA: Insufficient documentation

## 2016-12-23 DIAGNOSIS — Z5181 Encounter for therapeutic drug level monitoring: Secondary | ICD-10-CM | POA: Insufficient documentation

## 2016-12-23 DIAGNOSIS — F10129 Alcohol abuse with intoxication, unspecified: Secondary | ICD-10-CM | POA: Insufficient documentation

## 2016-12-23 DIAGNOSIS — J320 Chronic maxillary sinusitis: Secondary | ICD-10-CM | POA: Insufficient documentation

## 2016-12-23 DIAGNOSIS — N529 Male erectile dysfunction, unspecified: Secondary | ICD-10-CM | POA: Insufficient documentation

## 2016-12-23 DIAGNOSIS — G894 Chronic pain syndrome: Secondary | ICD-10-CM | POA: Insufficient documentation

## 2016-12-23 DIAGNOSIS — F17210 Nicotine dependence, cigarettes, uncomplicated: Secondary | ICD-10-CM | POA: Insufficient documentation

## 2016-12-23 DIAGNOSIS — G629 Polyneuropathy, unspecified: Secondary | ICD-10-CM | POA: Insufficient documentation

## 2016-12-23 DIAGNOSIS — I10 Essential (primary) hypertension: Secondary | ICD-10-CM | POA: Insufficient documentation

## 2016-12-23 DIAGNOSIS — K219 Gastro-esophageal reflux disease without esophagitis: Secondary | ICD-10-CM | POA: Insufficient documentation

## 2016-12-23 DIAGNOSIS — E876 Hypokalemia: Secondary | ICD-10-CM | POA: Insufficient documentation

## 2016-12-23 DIAGNOSIS — F411 Generalized anxiety disorder: Secondary | ICD-10-CM | POA: Insufficient documentation

## 2016-12-23 DIAGNOSIS — I426 Alcoholic cardiomyopathy: Secondary | ICD-10-CM | POA: Insufficient documentation

## 2016-12-23 DIAGNOSIS — F331 Major depressive disorder, recurrent, moderate: Secondary | ICD-10-CM | POA: Insufficient documentation

## 2016-12-23 DIAGNOSIS — E559 Vitamin D deficiency, unspecified: Secondary | ICD-10-CM | POA: Insufficient documentation

## 2016-12-23 DIAGNOSIS — I472 Ventricular tachycardia: Secondary | ICD-10-CM | POA: Insufficient documentation

## 2016-12-23 DIAGNOSIS — R45851 Suicidal ideations: Secondary | ICD-10-CM | POA: Insufficient documentation

## 2016-12-23 DIAGNOSIS — E785 Hyperlipidemia, unspecified: Secondary | ICD-10-CM | POA: Insufficient documentation

## 2016-12-23 DIAGNOSIS — M7918 Myalgia, other site: Secondary | ICD-10-CM | POA: Insufficient documentation

## 2016-12-23 DIAGNOSIS — M5441 Lumbago with sciatica, right side: Secondary | ICD-10-CM

## 2016-12-23 DIAGNOSIS — F1120 Opioid dependence, uncomplicated: Secondary | ICD-10-CM | POA: Insufficient documentation

## 2016-12-23 DIAGNOSIS — M25511 Pain in right shoulder: Secondary | ICD-10-CM | POA: Insufficient documentation

## 2016-12-23 DIAGNOSIS — M5442 Lumbago with sciatica, left side: Secondary | ICD-10-CM

## 2016-12-23 DIAGNOSIS — G8929 Other chronic pain: Secondary | ICD-10-CM

## 2016-12-23 MED ORDER — GABAPENTIN 800 MG PO TABS: 800.0000 mg | ORAL_TABLET | Freq: Four times a day (QID) | ORAL | 2 refills | Status: DC

## 2016-12-23 MED ORDER — CYCLOBENZAPRINE HCL 10 MG PO TABS: 10.0000 mg | ORAL_TABLET | Freq: Every day | ORAL | 2 refills | Status: DC

## 2016-12-23 MED ORDER — TRAMADOL HCL 50 MG PO TABS: 100.0000 mg | ORAL_TABLET | Freq: Four times a day (QID) | ORAL | 2 refills | Status: DC

## 2016-12-23 MED FILL — traMADol HCL 50 MG TABS: 50 | 30 days supply | Qty: 240 | Fill #0

## 2016-12-23 NOTE — Progress Notes (Signed)
Patient's Name: Mark [name]  MRN: [id number]  Referring Provider: Boykin Nearing, MD  DOB: 11/11/1965  PCP: Boykin Nearing, MD  DOS: 12/23/2016  Note by: Vevelyn Francois NP  Service setting: Ambulatory outpatient  Specialty: Interventional Pain Management  Location: [facility] (AMB) Pain Management Facility    Patient type: Established    Primary Reason(s) for Visit: Encounter for prescription drug management. (Level of risk: moderate)  CC: Foot Pain (bilateral); Back Pain (lower worse on the right); Hip Pain (right); and Neck Pain (s/p cervical fusion, states there are loose fragments floating )  HPI  Mr. Draheim is a 51 y.o. year old, male patient, who comes today for a medication management evaluation. 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 ([place]); Alcohol intoxication ([place]); Chronic low back pain (Location of Secondary source of pain) (Bilateral) (R>L); Chronic pain syndrome; Paroxysmal VT ([place]); Opioid dependence ([place]); Long Q-T syndrome; Hypokalemia; Alcohol use disorder, severe, dependence ([place]); Chronic shoulder pain (Right); Chronic radicular low back pain (Location of Tertiary source of pain) (Right) (to calf); Closed fracture of 5th metacarpal; Chronic fatigue; Vitamin D insufficiency; Poor dentition; Chronic maxillary sinusitis; Tender prostate; Erectile dysfunction; Chronic anxiety; GERD (gastroesophageal reflux disease); Suicidal ideation; Non-sustained ventricular tachycardia ([place]); GAD (generalized anxiety disorder); Alcohol use disorder, severe, in early remission ([place]); Long term current use of opiate analgesic; Long term prescription opiate use; History of fusion of cervical spine; Chronic neck pain (Bilateral)(R>L); Cervical fusion syndrome; Failed back surgical syndrome; Chronic knee pain  (Bilateral) (R>L); Major depressive disorder, recurrent episode, moderate ([place]);  Musculoskeletal pain; 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 His primarily concern today is the Foot Pain (bilateral); Back Pain (lower worse on the right); Hip Pain (right); and Neck Pain (s/p cervical fusion, states there are loose fragments floating )  Pain Assessment: Location: Lower, Left, Right (right is worse) Back (see visit info for more details of pain) Radiating: down both legs, difficult to tell where it stops d/t peripheral neuropathies in feet Onset: More than a month ago Duration: Chronic pain Quality: Constant, Pins and needles, Sore, Discomfort, Burning Severity: 4 /10 (self-reported pain score)  Note: Reported level is compatible with observation.                    Effect on ADL: unable to do much activity for very long Timing: Constant Modifying factors: gabapentin helps the feet, pain medication  Mr. Nickson was last scheduled for an appointment on 09/16/2016 for medication management. During today's appointment we reviewed Mr. Rorie chronic pain status, as well as his outpatient medication regimen. He admits that his leg pain is the worse. He states that not being able to "walk right" is a great deal of his problem. He admits that he continues to smoke 1/2 pack per day. He is concern that the Gabapentin maybe causing him some memory loss. He states that he has decreased his tabs to TID.   The patient  reports that he does not use drugs. His body mass index is 26.17 kg/m.  Further details on both, my assessment(s), as well as the proposed treatment plan, please see below.  Controlled Substance Pharmacotherapy Assessment REMS (Risk Evaluation and Mitigation  Strategy)  Tramadol 100 mg  4 times a day (400 mg/dayof tramadol)  MME/day:'40mg'$ /day.  Janett Billow, RN  12/23/2016 11:13 AM  Sign at close encounter Nursing Pain Medication Assessment:  Safety precautions to be maintained throughout the outpatient stay will include: orient to surroundings, keep bed in low position, maintain call bell within reach at all times, provide assistance with transfer out of bed and ambulation.  Medication Inspection Compliance: Pill count conducted under aseptic conditions, in front of the patient. Neither the pills nor the bottle was removed from the patient's sight at any time. Once count was completed pills were immediately returned to the patient in their original bottle.  Medication: Tramadol (Ultram) Pill/Patch Count: 0 of 240 pills remain Pill/Patch Appearance: Markings consistent with prescribed medication Bottle Appearance: Standard pharmacy container. Clearly labeled. Filled Date: 09 / 05 / 2018 Last Medication intake:  Ran out of medicine more than 48 hours ago   Pharmacokinetics: Liberation and absorption (onset of action): WNL Distribution (time to peak effect): WNL Metabolism and excretion (duration of action): WNL         Pharmacodynamics: Desired effects: Analgesia: Mr. Lincks reports >50% benefit. Functional ability: Patient reports that medication allows him to accomplish basic ADLs Clinically meaningful improvement in function (CMIF): Sustained CMIF goals met Perceived effectiveness: Described as relatively effective, allowing for increase in activities of daily living (ADL) Undesirable effects: Side-effects or Adverse reactions: None reported Monitoring: [place] PMP: Online review of the past 49-monthperiod conducted. Compliant with practice rules and regulations Last UDS on record: Summary  Date Value Ref Range Status  09/16/2016 FINAL  Final    Comment:     ==================================================================== TOXASSURE SELECT 13 (MW) ==================================================================== Test                             Result       Flag       Units Drug Absent but Declared for Prescription Verification   Tramadol                       Not Detected UNEXPECTED ==================================================================== Test                      Result    Flag   Units      Ref Range   Creatinine              309              mg/dL      >=20 ==================================================================== Declared Medications:  The flagging and interpretation on this report are based on the  following declared medications.  Unexpected results may arise from  inaccuracies in the declared medications.  **Note: The testing scope of this panel includes these medications:  Tramadol  **Note: The testing scope of this panel does not include following  reported medications:  Albuterol (Ventolin HFA)  Baclofen  Cyclobenzaprine  Gabapentin  Losartan (Cozaar)  Metoprolol ==================================================================== For clinical consultation, please call [phone]====    UDS interpretation: Compliant          Medication Assessment Form: Reviewed. Patient indicates being compliant with therapy Treatment compliance: Compliant Risk Assessment Profile: Aberrant behavior: See prior evaluations. None observed or detected today Comorbid factors increasing risk of overdose: See prior notes. No additional risks detected today Risk of substance use disorder (SUD): Low     Opioid Risk Tool - 12/23/16 1119  Family History of Substance Abuse   Alcohol Positive Male  father and grandfather   Illegal Drugs Negative   Rx Drugs Negative     Personal History of Substance Abuse   Alcohol Positive Male or Male   sober x 5 years   Illegal Drugs Negative   Rx Drugs Negative     Psychological Disease   Psychological Disease Negative   Depression Negative     Total Score   Opioid Risk Tool Scoring 6   Opioid Risk Interpretation Moderate Risk     ORT Scoring interpretation table:  Score <3 = Low Risk for SUD  Score between 4-7 = Moderate Risk for SUD  Score >8 = High Risk for Opioid Abuse   Risk Mitigation Strategies:  Patient Counseling: Covered Patient-Prescriber Agreement (PPA): Present and active  Notification to other healthcare providers: Done  Pharmacologic Plan: No change in therapy, at this time  Laboratory Chemistry  Inflammation Markers (CRP: Acute Phase) (ESR: Chronic Phase) Lab Results  Component Value Date   CRP 0.9 03/18/2016   ESRSEDRATE 2 03/18/2016                 Renal Function Markers Lab Results  Component Value Date   BUN 5 (L) 05/15/2016   CREATININE 0.72 05/15/2016   GFRAA >60 05/15/2016   GFRNONAA >60 05/15/2016                 Hepatic Function Markers Lab Results  Component Value Date   AST 30 05/15/2016   ALT 40 05/15/2016   ALBUMIN 4.7 05/15/2016   ALKPHOS 87 05/15/2016   HCVAB NEGATIVE 06/14/2012                 Electrolytes Lab Results  Component Value Date   NA 138 05/15/2016   K 3.4 (L) 05/15/2016   CL 101 05/15/2016   CALCIUM 9.0 05/15/2016   MG 2.1 03/18/2016                 Neuropathy Markers Lab Results  Component Value Date   VITAMINB12 244 03/18/2016                 Bone Pathology Markers Lab Results  Component Value Date   ALKPHOS 87 05/15/2016   VD25OH 21 (L) 07/04/2014   25OHVITD1 35 03/18/2016   25OHVITD2 1.0 03/18/2016   25OHVITD3 34 03/18/2016   CALCIUM 9.0 05/15/2016   TESTOFREE 78.9 09/16/2014   TESTOSTERONE 365 09/16/2014                 Coagulation Parameters Lab Results  Component Value Date   INR 1.04 06/07/2013   LABPROT 13.4 06/07/2013   PLT 298 05/15/2016                 Cardiovascular  Markers Lab Results  Component Value Date   BNP 23.9 02/27/2015   HGB 17.7 (H) 05/15/2016   HCT 49.2 05/15/2016                 Note: Lab results reviewed.  Recent Diagnostic Imaging Results  DG C-Arm 1-60 Min-No Report Fluoroscopy was utilized by the requesting physician.  No radiographic  interpretation.   Complexity Note: Imaging results reviewed. Results shared with Mr. Woolbright, using Layman's terms.                         Meds   Current Outpatient Prescriptions:  .  gabapentin (NEURONTIN) 800 MG  tablet, Take 1 tablet (800 mg total) by mouth 4 (four) times daily., Disp: 120 tablet, Rfl: 2 .  guanFACINE (TENEX) 1 MG tablet, Take 1 tablet (1 mg total) by mouth 3 (three) times daily., Disp: 90 tablet, Rfl: 2 .  metoprolol (LOPRESSOR) 50 MG tablet, Take 1 tablet (50 mg total) by mouth 2 (two) times daily., Disp: 60 tablet, Rfl: 11 .  VENTOLIN HFA 108 (90 Base) MCG/ACT inhaler, INHALE 2 PUFFS INTO THE LUNGS EVERY 4 HOURS AS NEEDED FOR WHEEZING OR SHORTNESS OF BREATH., Disp: 18 g, Rfl: 1 .  cyclobenzaprine (FLEXERIL) 10 MG tablet, Take 1 tablet (10 mg total) by mouth at bedtime., Disp: 30 tablet, Rfl: 2 .  traMADol (ULTRAM) 50 MG tablet, Take 2 tablets (100 mg total) by mouth 4 (four) times daily. 1-2 tablets, Disp: 240 tablet, Rfl: 2  ROS  Constitutional: Denies any fever or chills Gastrointestinal: No reported hemesis, hematochezia, vomiting, or acute GI distress Musculoskeletal: Denies any acute onset joint swelling, redness, loss of ROM, or weakness Neurological: No reported episodes of acute onset apraxia, aphasia, dysarthria, agnosia, amnesia, paralysis, loss of coordination, or loss of consciousness  Allergies  Mr. Lukins is allergic to cozaar [losartan] and lisinopril.  PFSH  Drug: Mr. Gibbon  reports that he does not use drugs. Alcohol:  reports that he does not drink alcohol. Tobacco:  reports that he has been smoking Cigarettes.  He has a 15.00 pack-year smoking  history. He has never used smokeless tobacco. Medical:  has a past medical history of Alcohol withdrawal ([name]); Allergy; Anxiety (Dx 2014); COPD (chronic obstructive pulmonary disease) ([name]); Drug abuse ([name]); Enlarged prostate; ETOH abuse; GERD (gastroesophageal reflux disease) (Dx 2003); Headache(784.0); Hepatitis; Hyperlipidemia (Dx 2000); Hypertension (Dx 2011); IBS (irritable bowel syndrome); Irregular heart beat; Long Q-T syndrome (01/23/2014); Mental disorder; Neuromuscular disorder ([name]); Neuropathy (06/07/2013); Seizure due to alcohol withdrawal ([name]) (01/23/2014); Shortness of breath; and Withdrawal seizures ([name]) (2014). Surgical: Mr. Banas  has a past surgical history that includes Back surgery (1995, 1999); Neck surgery (2000); Nasal sinus surgery; left heart catheterization with coronary angiogram (N/A, 01/13/2014); Subacromial decompression (Right, 11/16/2014); Nissen fundoplication; 24 hour ph study (N/A, 01/16/2015); Upper gastrointestinal endoscopy; and Colonoscopy (1998). Family: family history includes Alcohol abuse in his father and paternal grandfather; Breast cancer in his paternal grandmother; Colon polyps in his mother; Hypertension in his mother; Lung cancer in his father.  Constitutional Exam  General appearance: Well nourished, well developed, and well hydrated. In no apparent acute distress Vitals:   12/23/16 1108  BP: 122/88  Pulse: 66  Resp: 16  Temp: 97.8 F (36.6 C)  TempSrc: Oral  SpO2: 100%  Weight: 215 lb (97.5 kg)  Height: 6\' 4"  (1.93 m)   BMI Assessment: Estimated body mass index is 26.17 kg/m as calculated from the following:   Height as of this encounter: 6\' 4"  (1.93 m).   Weight as of this encounter: 215 lb (97.5 kg). Psych/Mental status: Alert, oriented x 3 (person, place, & time)       Eyes: PERLA Respiratory: No evidence of acute respiratory distress  Lumbar Spine Area Exam  Skin & Axial Inspection: No masses, redness, or swelling Alignment:  Symmetrical Functional ROM: Unrestricted ROM      Stability: No instability detected Muscle Tone/Strength: Functionally intact. No obvious neuro-muscular anomalies detected. Sensory (Neurological): Unimpaired Palpation: Non-tender       Provocative Tests: Lumbar Hyperextension and rotation test: evaluation deferred today       Lumbar Lateral bending test:  evaluation deferred today       Patrick's Maneuver: evaluation deferred today                    Gait & Posture Assessment  Ambulation: Unassisted Gait: Relatively normal for age and body habitus Posture: WNL   Lower Extremity Exam    Side: Right lower extremity  Side: Left lower extremity  Skin & Extremity Inspection: Skin color, temperature, and hair growth are WNL. No peripheral edema or cyanosis. No masses, redness, swelling, asymmetry, or associated skin lesions. No contractures.  Skin & Extremity Inspection: Skin color, temperature, and hair growth are WNL. No peripheral edema or cyanosis. No masses, redness, swelling, asymmetry, or associated skin lesions. No contractures.  Functional ROM: Unrestricted ROM          Functional ROM: Unrestricted ROM          Muscle Tone/Strength: Functionally intact. No obvious neuro-muscular anomalies detected.  Muscle Tone/Strength: Functionally intact. No obvious neuro-muscular anomalies detected.  Sensory (Neurological): Unimpaired  Sensory (Neurological): Unimpaired  Palpation: No palpable anomalies  Palpation: No palpable anomalies   Assessment  Primary Diagnosis & Pertinent Problem List: The primary encounter diagnosis was Chronic low back pain (Location of Secondary source of pain) (Bilateral) (R>L). Diagnoses of Chronic neck pain (Bilateral)(R>L), Neuropathy, Peripheral (Feet) ([name]) (Location of Primary Source of Pain) (Bilateral) (R>L), Chronic pain syndrome, and Musculoskeletal pain were also pertinent to this visit.  Status Diagnosis  Controlled Controlled Controlled 1. Chronic low  back pain (Location of Secondary source of pain) (Bilateral) (R>L)   2. Chronic neck pain (Bilateral)(R>L)   3. Neuropathy, Peripheral (Feet) ([name]) (Location of Primary Source of Pain) (Bilateral) (R>L)   4. Chronic pain syndrome   5. Musculoskeletal pain     Problems updated and reviewed during this visit: No problems updated. Plan of Care  Pharmacotherapy (Medications Ordered): Meds ordered this encounter  Medications  . traMADol (ULTRAM) 50 MG tablet    Sig: Take 2 tablets (100 mg total) by mouth 4 (four) times daily. 1-2 tablets    Dispense:  240 tablet    Refill:  2    Fill one day early if pharmacy is closed on scheduled refill date. Do not fill until: 12/23/2016 To last until: 03/23/2017    Order Specific Question:   Supervising Provider    Answer:   Milinda Pointer [phone]  . cyclobenzaprine (FLEXERIL) 10 MG tablet    Sig: Take 1 tablet (10 mg total) by mouth at bedtime.    Dispense:  30 tablet    Refill:  2    Do not place medication on "Automatic Refill". Fill one day early if pharmacy is closed on scheduled refill date.    Order Specific Question:   Supervising Provider    Answer:   Milinda Pointer [phone]  . gabapentin (NEURONTIN) 800 MG tablet    Sig: Take 1 tablet (800 mg total) by mouth 4 (four) times daily.    Dispense:  120 tablet    Refill:  2    Do not place medication on "Automatic Refill". Fill one day early if pharmacy is closed on scheduled refill date.    Order Specific Question:   Supervising Provider    Answer:   Milinda Pointer [[id number]]   New Prescriptions   No medications on file   Medications administered today: Mr. Brummitt had no medications administered during this visit. Lab-work, procedure(s), and/or referral(s): No orders of the defined types were placed in this encounter.  Imaging and/or referral(s): None  Interventional therapies: Planned, scheduled, and/or pending:  right lumbar sympathetic L3 and L4 radiofrequency  ablation(scheduled)   Considering:  Lower extremity EMG/PNCV. (Radiculopathy versus peripheral neuropathy) Upper extremity EMG/PNCV.  Possibleright-sided L3 + L4 Lumbarsympathetic RFA Possible Racz procedure Diagnostic cervical epidural steroidinjection  Diagnostic bilateral cervical facet block  Possible bilateral cervical facet RFA Diagnostic bilateral lumbar facet block Possible bilateral lumbar facet RFA Diagnostic right intra-articular shoulder joint injection Diagnostic right suprascapular nerveblock Possibleright suprascapular RFA Diagnostic bilateral intra-articular knee joint injection Possible bilateral series of 5 Hyalgan knee injections Diagnostic bilateral Genicular nerve block Possible bilateral Genicular RFA Diagnostic bilateral greater occipital nerve block Possible bilateral greater occipital nerve RFA Diagnostic bilateral C2 +TON nerve block Possible bilateral C2 + TON RFA   Palliative PRN treatment(s):  Diagnostic Caudal ESI#3 Diagnostic right-sided L3 + L4 lumbar sympathetic block#3    Provider-requested follow-up: Return in about 3 months (around 03/25/2017) for MedMgmt.  Future Appointments Date Time Provider [place]  12/26/2016 10:30 AM Milinda Pointer, MD [facility] None  01/13/2017 2:15 PM Daron Offer, Richard Miu, MD [facility] None  03/25/2017 11:00 AM Vevelyn Francois, NP [facility] None   Primary Care Physician: Boykin Nearing, MD Location: [facility] Note by: Vevelyn Francois NP Date: 12/23/2016; Time: 2:47 PM  Pain Score Disclaimer: We use the NRS-11 scale. This is a self-reported, subjective measurement of pain severity with only modest accuracy. It is used primarily to identify changes within a particular patient. It must be understood that outpatient pain scales are significantly less accurate that those used for research, where they can be applied under ideal controlled circumstances  with minimal exposure to variables. In reality, the score is likely to be a combination of pain intensity and pain affect, where pain affect describes the degree of emotional arousal or changes in action readiness caused by the sensory experience of pain. Factors such as social and work situation, setting, emotional state, anxiety levels, expectation, and prior pain experience may influence pain perception and show large inter-individual differences that may also be affected by time variables.  Patient instructions provided during this appointment: Patient Instructions   ____________________________________________________________________________________________  Medication Rules  Applies to: All patients receiving prescriptions (written or electronic).  Pharmacy of record: Pharmacy where electronic prescriptions will be sent. If written prescriptions are taken to a different pharmacy, please inform the nursing staff. The pharmacy listed in the electronic medical record should be the one where you would like electronic prescriptions to be sent.  Prescription refills: Only during scheduled appointments. Applies to both, written and electronic prescriptions.  NOTE: The following applies primarily to controlled substances (Opioid* Pain Medications).   Patient's responsibilities: 1. Pain Pills: Bring all pain pills to every appointment (except for procedure appointments). 2. Pill Bottles: Bring pills in original pharmacy bottle. Always bring newest bottle. Bring bottle, even if empty. 3. Medication refills: You are responsible for knowing and keeping track of what medications you need refilled. The day before your appointment, write a list of all prescriptions that need to be refilled. Bring that list to your appointment and give it to the admitting nurse. Prescriptions will be written only during appointments. If you forget a medication, it will not be "Called in", "Faxed", or "electronically sent".  You will need to get another appointment to get these prescribed. 4. Prescription Accuracy: You are responsible for carefully inspecting your prescriptions before leaving our office. Have the discharge nurse carefully go over each prescription with you, before  taking them home. Make sure that your name is accurately spelled, that your address is correct. Check the name and dose of your medication to make sure it is accurate. Check the number of pills, and the written instructions to make sure they are clear and accurate. Make sure that you are given enough medication to last until your next medication refill appointment. 5. Taking Medication: Take medication as prescribed. Never take more pills than instructed. Never take medication more frequently than prescribed. Taking less pills or less frequently is permitted and encouraged, when it comes to controlled substances (written prescriptions).  6. Inform other Doctors: Always inform, all of your healthcare providers, of all the medications you take. 7. Pain Medication from other Providers: You are not allowed to accept any additional pain medication from any other Doctor or Healthcare provider. There are two exceptions to this rule. (see below) In the event that you require additional pain medication, you are responsible for notifying us, as stated below. 8. Medication Agreement: You are responsible for carefully reading and following our Medication Agreement. This must be signed before receiving any prescriptions from our practice. Safely store a copy of your signed Agreement. Violations to the Agreement will result in no further prescriptions. (Additional copies of our Medication Agreement are available upon request.) 9. Laws, Rules, & Regulations: All patients are expected to follow all [address] and [employer], Rules, [employer]. Ignorance of the Laws does not constitute a valid excuse. The use of any illegal substances is  prohibited. 10. Adopted CDC guidelines & recommendations: Target dosing levels will be at or below 60 MME/day. Use of benzodiazepines** is not recommended.  Exceptions: There are only two exceptions to the rule of not receiving pain medications from other Healthcare Providers. 1. Exception #1 (Emergencies): In the event of an emergency (i.e.: accident requiring emergency care), you are allowed to receive additional pain medication. However, you are responsible for: As soon as you are able, call our office [phone], at any time of the day or night, and leave a message stating your name, the date and nature of the emergency, and the name and dose of the medication prescribed. In the event that your call is answered by a member of our staff, make sure to document and save the date, time, and the name of the person that took your information.  2. Exception #2 (Planned Surgery): In the event that you are scheduled by another doctor or dentist to have any type of surgery or procedure, you are allowed (for a period no longer than 30 days), to receive additional pain medication, for the acute post-op pain. However, in this case, you are responsible for picking up a copy of our "Post-op Pain Management for Surgeons" handout, and giving it to your surgeon or dentist. This document is available at our office, and does not require an appointment to obtain it. Simply go to our office during business hours (Monday-Thursday from 8:00 AM to 4:00 PM) (Friday 8:00 AM to 12:00 Noon) or if you have a scheduled appointment with [place], prior to your surgery, and ask for it by name. In addition, you will need to provide [place] with your name, name of your surgeon, type of surgery, and date of procedure or surgery.  *Opioid medications include: morphine, codeine, oxycodone, oxymorphone, hydrocodone, hydromorphone, meperidine, tramadol, tapentadol, buprenorphine, fentanyl, methadone. **Benzodiazepine medications include: diazepam  (Valium), alprazolam (Xanax), clonazepam (Klonopine), lorazepam (Ativan), clorazepate (Tranxene), chlordiazepoxide (Librium), estazolam (Prosom), oxazepam (Serax), temazepam (Restoril), triazolam (Halcion)  ____________________________________________________________________________________________     

## 2016-12-23 NOTE — Patient Instructions (Signed)

## 2016-12-23 NOTE — Progress Notes (Signed)
Nursing Pain Medication Assessment:  Safety precautions to be maintained throughout the outpatient stay will include: orient to surroundings, keep bed in low position, maintain call bell within reach at all times, provide assistance with transfer out of bed and ambulation.  Medication Inspection Compliance: Pill count conducted under aseptic conditions, in front of the patient. Neither the pills nor the bottle was removed from the patient's sight at any time. Once count was completed pills were immediately returned to the patient in their original bottle.  Medication: Tramadol (Ultram) Pill/Patch Count: 0 of 240 pills remain Pill/Patch Appearance: Markings consistent with prescribed medication Bottle Appearance: Standard pharmacy container. Clearly labeled. Filled Date: 09 / 05 / 2018 Last Medication intake:  Ran out of medicine more than 48 hours ago

## 2016-12-26 ENCOUNTER — Ambulatory Visit
Admission: RE | Admit: 2016-12-26 | Discharge: 2016-12-26 | Disposition: A | Discharge status: Home / Self Care | Payer: Self-pay | Source: Ambulatory Visit | Attending: Pain Medicine | Admitting: Pain Medicine

## 2016-12-26 ENCOUNTER — Ambulatory Visit (HOSPITAL_BASED_OUTPATIENT_CLINIC_OR_DEPARTMENT_OTHER): Payer: Self-pay | Admitting: Pain Medicine

## 2016-12-26 ENCOUNTER — Encounter: Payer: Self-pay | Admitting: Pain Medicine

## 2016-12-26 VITALS — BP 154/80 | HR 61 | Temp 97.7°F | Resp 15 | Ht 76.0 in | Wt 215.0 lb

## 2016-12-26 DIAGNOSIS — G8929 Other chronic pain: Secondary | ICD-10-CM

## 2016-12-26 DIAGNOSIS — R208 Other disturbances of skin sensation: Secondary | ICD-10-CM | POA: Insufficient documentation

## 2016-12-26 DIAGNOSIS — M792 Neuralgia and neuritis, unspecified: Secondary | ICD-10-CM | POA: Insufficient documentation

## 2016-12-26 DIAGNOSIS — M79605 Pain in left leg: Secondary | ICD-10-CM | POA: Insufficient documentation

## 2016-12-26 DIAGNOSIS — M79604 Pain in right leg: Secondary | ICD-10-CM | POA: Insufficient documentation

## 2016-12-26 DIAGNOSIS — G5793 Unspecified mononeuropathy of bilateral lower limbs: Secondary | ICD-10-CM

## 2016-12-26 DIAGNOSIS — M545 Low back pain: Secondary | ICD-10-CM | POA: Insufficient documentation

## 2016-12-26 DIAGNOSIS — G8918 Other acute postprocedural pain: Secondary | ICD-10-CM | POA: Insufficient documentation

## 2016-12-26 HISTORY — DX: Other disturbances of skin sensation: R20.8

## 2016-12-26 MED ORDER — BUPIVACAINE-EPINEPHRINE 0.25% -1:200000 IJ SOLN
10.0000 mL | Freq: Once | INTRAMUSCULAR | Status: DC
  Filled 2016-12-26: qty 10

## 2016-12-26 MED ORDER — FENTANYL CITRATE (PF) 100 MCG/2ML IJ SOLN
25.0000 ug | INTRAMUSCULAR | Status: DC | PRN
Start: 2016-12-26 — End: 2016-12-26
  Administered 2016-12-26: 100 ug via INTRAVENOUS
  Filled 2016-12-26: qty 2

## 2016-12-26 MED ORDER — LIDOCAINE HCL 2 % IJ SOLN
20.0000 mL | Freq: Once | INTRAMUSCULAR | Status: AC
  Administered 2016-12-26: 400 mg
  Filled 2016-12-26: qty 20

## 2016-12-26 MED ORDER — OXYCODONE-ACETAMINOPHEN 5-325 MG PO TABS: 1.0000 | ORAL_TABLET | Freq: Four times a day (QID) | ORAL | 0 refills | Status: DC | PRN

## 2016-12-26 MED ORDER — LACTATED RINGERS IV SOLN
1000.0000 mL | Freq: Once | INTRAVENOUS | Status: DC
Start: 2016-12-26 — End: 2016-12-26

## 2016-12-26 MED ORDER — IOPAMIDOL (ISOVUE-M 200) INJECTION 41%
INTRAMUSCULAR | Status: AC
  Filled 2016-12-26: qty 10

## 2016-12-26 MED ORDER — DEXAMETHASONE SODIUM PHOSPHATE 10 MG/ML IJ SOLN
10.0000 mg | Freq: Once | INTRAMUSCULAR | Status: AC
  Administered 2016-12-26: 10 mg
  Filled 2016-12-26: qty 1

## 2016-12-26 MED ORDER — MIDAZOLAM HCL 5 MG/5ML IJ SOLN
1.0000 mg | INTRAMUSCULAR | Status: DC | PRN
  Administered 2016-12-26: 5 mg via INTRAVENOUS
  Filled 2016-12-26: qty 5

## 2016-12-26 MED ORDER — BUPIVACAINE-EPINEPHRINE (PF) 0.25% -1:200000 IJ SOLN
INTRAMUSCULAR | Status: AC
Start: 2016-12-26 — End: 2016-12-26
  Filled 2016-12-26: qty 30

## 2016-12-26 NOTE — Progress Notes (Signed)
prior to d/c spoke with Dr Dossie Arbour re; pain in groin.  to be expected.

## 2016-12-26 NOTE — Patient Instructions (Signed)

## 2016-12-26 NOTE — Progress Notes (Signed)
Safety precautions to be maintained throughout the outpatient stay will include: orient to surroundings, keep bed in low position, maintain call bell within reach at all times, provide assistance with transfer out of bed and ambulation.  

## 2016-12-26 NOTE — Progress Notes (Signed)
Patient's Name: Mark [name]  MRN: [id number]  Referring Provider: Boykin Nearing, MD  DOB: 10/30/1965  PCP: Boykin Nearing, MD  DOS: 12/26/2016  Note by: [name], MD  Service setting: Ambulatory outpatient  Specialty: Interventional Pain Management  Patient type: Established  Location: [facility] (AMB) Pain Management Facility  Visit type: Interventional Procedure   Primary Reason for Visit: Interventional Pain Management Treatment. CC: Back Pain (low)  Procedure:  Anesthesia, Analgesia, Anxiolysis:  Type: Therapeutic Lumbar Sympathetic Radiofrequency Ablation Region:Thoracolumbar Level: L3, L4 Laterality: Right-Sided Paravertebral  Type: Local Anesthesia with Moderate (Conscious) Sedation Local Anesthetic: Lidocaine 1% Route: Intravenous (IV) IV Access: Secured Sedation: Meaningful verbal contact was maintained at all times during the procedure  Indication(s): Analgesia and Anxiety   Indications: 1. 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 Sympathetic pain (lower extremity)   3. Chronic lower extremity pain (Bilateral) (R>L)   4. Acute postoperative pain   5. Allodynia (Lower extremities)   6. Hyperalgesia (Lower extremities)    Mark [name] has either failed to respond, was unable to tolerate, or simply did not get enough benefit from other more conservative therapies including, but not limited to: 1. Over-the-counter medications 2. Anti-inflammatory medications 3. Muscle relaxants 4. Membrane stabilizers 5. Opioids 6. Physical therapy 7. Modalities (Heat, ice, etc.) 8. Invasive techniques such as nerve blocks. Mark [name] has attained more than 50% relief of the pain from a series of diagnostic injections conducted in separate occasions.  Pain Score: Pre-procedure: 7 /10 Post-procedure: 7  (pain in right groin area)/10  Pre-op Assessment:  Mark [name] is a 51 y.o. (year old), male patient, seen today for interventional  treatment. He  has a past surgical history that includes Back surgery (1995, 1999); Neck surgery (2000); Nasal sinus surgery; left heart catheterization with coronary angiogram (N/A, 01/13/2014); Subacromial decompression (Right, 11/16/2014); Nissen fundoplication; 24 hour ph study (N/A, 01/16/2015); Upper gastrointestinal endoscopy; and Colonoscopy (1998). Mark [name] has a current medication list which includes the following prescription(s): cyclobenzaprine, gabapentin, guanfacine, metoprolol tartrate, oxycodone-acetaminophen, tramadol, and ventolin hfa, and the following Facility-Administered Medications: bupivacaine-epinephrine, fentanyl, lactated ringers, and midazolam. His primarily concern today is the Back Pain (low)  Initial Vital Signs: There were no vitals taken for this visit. BMI: Estimated body mass index is 26.17 kg/m as calculated from the following:   Height as of this encounter: 6\' 4"  (1.93 m).   Weight as of this encounter: 215 lb (97.5 kg).  Risk Assessment: Allergies: Reviewed. He is allergic to cozaar [losartan] and lisinopril.  Allergy Precautions: None required Coagulopathies: Reviewed. None identified.  Blood-thinner therapy: None at this time Active Infection(s): Reviewed. None identified. Mark [name] is afebrile  Site Confirmation: Mark [name] was asked to confirm the procedure and laterality before marking the site Procedure checklist: Completed Consent: Before the procedure and under the influence of no sedative(s), amnesic(s), or anxiolytics, the patient was informed of the treatment options, risks and possible complications. To fulfill our ethical and legal obligations, as recommended by the American Medical Association's Code of Ethics, I have informed the patient of my clinical impression; the nature and purpose of the treatment or procedure; the risks, benefits, and possible complications of the intervention; the alternatives, including doing nothing; the risk(s) and  benefit(s) of the alternative treatment(s) or procedure(s); and the risk(s) and benefit(s) of doing nothing. The patient was provided information about the general risks and possible complications associated with the procedure. These may include, but are not limited to: failure  to achieve desired goals, infection, bleeding, organ or nerve damage, allergic reactions, paralysis, and death. In addition, the patient was informed of those risks and complications associated to Spine-related procedures, such as failure to decrease pain; infection (i.e.: Meningitis, epidural or intraspinal abscess); bleeding (i.e.: epidural hematoma, subarachnoid hemorrhage, or any other type of intraspinal or peri-dural bleeding); organ or nerve damage (i.e.: Any type of peripheral nerve, nerve root, or spinal cord injury) with subsequent damage to sensory, motor, and/or autonomic systems, resulting in permanent pain, numbness, and/or weakness of one or several areas of the body; allergic reactions; (i.e.: anaphylactic reaction); and/or death. Furthermore, the patient was informed of those risks and complications associated with the medications. These include, but are not limited to: allergic reactions (i.e.: anaphylactic or anaphylactoid reaction(s)); adrenal axis suppression; blood sugar elevation that in diabetics may result in ketoacidosis or comma; water retention that in patients with history of congestive heart failure may result in shortness of breath, pulmonary edema, and decompensation with resultant heart failure; weight gain; swelling or edema; medication-induced neural toxicity; particulate matter embolism and blood vessel occlusion with resultant organ, and/or nervous system infarction; and/or aseptic necrosis of one or more joints. Finally, the patient was informed that Medicine is not an exact science; therefore, there is also the possibility of unforeseen or unpredictable risks and/or possible complications that may  result in a catastrophic outcome. The patient indicated having understood very clearly. We have given the patient no guarantees and we have made no promises. Enough time was given to the patient to ask questions, all of which were answered to the patient's satisfaction. Mr. Hirschman has indicated that he wanted to continue with the procedure. Attestation: I, the ordering provider, attest that I have discussed with the patient the benefits, risks, side-effects, alternatives, likelihood of achieving goals, and potential problems during recovery for the procedure that I have provided informed consent. Date: 12/26/2016; Time: 8:14 AM  Pre-Procedure Preparation:  Monitoring: As per clinic protocol. Respiration, ETCO2, SpO2, BP, heart rate and rhythm monitor placed and checked for adequate function Safety Precautions: Patient was assessed for positional comfort and pressure points before starting the procedure. Time-out: I initiated and conducted the "Time-out" before starting the procedure, as per protocol. The patient was asked to participate by confirming the accuracy of the "Time Out" information. Verification of the correct person, site, and procedure were performed and confirmed by me, the nursing staff, and the patient. "Time-out" conducted as per Joint Commission's Universal Protocol (UP.01.01.01). "Time-out" Date & Time: 12/26/2016; 1132 hrs.  Description of Procedure Process:   Position: Prone with head of the table was raised to facilitate breathing. Target Area: For Lumbar Sympathetic Block(s), the target is the anterolateral aspect of the L3 & L4 vertebral bodies, where the lumbar sympathetic chain resides. Approach: Paravertebral, ipsilateral approach. Area Prepped: Entire Posterior Thoracolumbar Region Prepping solution: Hibiclens (4.0% Chlorhexidine gluconate solution) Safety Precautions: Aspiration looking for blood return was conducted prior to all injections. At no point did we inject  any substances, as a needle was being advanced. No attempts were made at seeking any paresthesias. Safe injection practices and needle disposal techniques used. Medications properly checked for expiration dates. SDV (single dose vial) medications used. Description of the Procedure: Protocol guidelines were followed. The patient was placed in position over the procedure table. The target area was identified and the area prepped in the usual manner. Skin & deeper tissues infiltrated with local anesthetic. Appropriate amount of time allowed to pass for local anesthetics to  take effect. The Radiofrequency needles were introduced ipsilateral to the affected side, aiming at the anterolateral aspect of the L3 & L4 vertebral bodies, where the Lumbar Sympathetic Chain is located. Using the Radiofrequency Generator, sensory stimulation using 50 Hz was used to locate & identify the nerve, making sure that the needle was positioned such that there was no sensory stimulation to the groin area at any output, or sensory stimulation below 0.3 V or above 0.7 V for the lower extremity. Stimulation using 2 Hz was used to evaluate the motor component. Care was taken not to lesion any nerves that demonstrated motor stimulation of the lower extremities. Once satisfactory placement of the needles was achieved, the above solution was slowly injected after negative aspiration. After waiting for at least 2 minutes, the ablation was performed at 80 degrees C for 60 seconds. The needles were then removed and the area cleansed, making sure to leave some of the prepping solution back to take advantage of its long term bactericidal properties. Intra-operative Compliance: Non-compliant. Patient continued to move after the administration of the local anesthetic, despite repeated requests not to do so Vitals:   12/26/16 1223 12/26/16 1234 12/26/16 1244 12/26/16 1254  BP: (!) 137/93 128/88 (!) 132/94 (!) 154/80  Pulse: 61     Resp: 11 16 17 15    Temp:  97.7 F (36.5 C)    TempSrc:  Tympanic    SpO2: 100% 95% 97% 96%  Weight:      Height:        Start Time: 1132 hrs. End Time: 1223 hrs. Materials & Medications:  Needle(s) Type: Teflon-coated, curved tip, Radiofrequency needle(s) Gauge: 22G Length: 15cm Medication(s): We administered midazolam, fentaNYL, lidocaine, and dexamethasone. Please see chart orders for dosing details.  Imaging Guidance (Spinal):  Type of Imaging Technique: Fluoroscopy Guidance (Spinal) Indication(s): Assistance in needle guidance and placement for procedures requiring needle placement in or near specific anatomical locations not easily accessible without such assistance. Exposure Time: Please see nurses notes. Contrast: Before injecting any contrast, we confirmed that the patient did not have an allergy to iodine, shellfish, or radiological contrast. Once satisfactory needle placement was completed at the desired level, radiological contrast was injected. Contrast injected under live fluoroscopy. No contrast complications. See chart for type and volume of contrast used. Fluoroscopic Guidance: I was personally present during the use of fluoroscopy. "Tunnel Vision Technique" used to obtain the best possible view of the target area. Parallax error corrected before commencing the procedure. "Direction-depth-direction" technique used to introduce the needle under continuous pulsed fluoroscopy. Once target was reached, antero-posterior, oblique, and lateral fluoroscopic projection used confirm needle placement in all planes. Images permanently stored in EMR. Interpretation: I personally interpreted the imaging intraoperatively. Adequate needle placement confirmed in multiple planes. Appropriate spread of contrast into desired area was observed. No evidence of afferent or efferent intravascular uptake. No intrathecal or subarachnoid spread observed. Permanent images saved into the patient's record.  Antibiotic  Prophylaxis:  Indication(s): None identified Antibiotic given: None  Post-operative Assessment:  EBL: None Complications: No immediate post-treatment complications observed by team, or reported by patient. Note: The patient tolerated the entire procedure well. A repeat set of vitals were taken after the procedure and the patient was kept under observation following institutional policy, for this type of procedure. Post-procedural neurological assessment was performed, showing return to baseline, prior to discharge. The patient was provided with post-procedure discharge instructions, including a section on how to identify potential problems. Should any problems arise concerning  this procedure, the patient was given instructions to immediately contact us, at any time, without hesitation. In any case, we plan to contact the patient by telephone for a follow-up status report regarding this interventional procedure. Comments:  No additional relevant information.  Plan of Care  Interventional management options: Planned, scheduled, and/or pending:   None at this time. We'll wait until we see what kind of results we get from today's procedure.   Considering:  Lower extremity EMG/PNCV. (Radiculopathy versus peripheral neuropathy) Upper extremity EMG/PNCV.  Palliativeright-sided L3 + L4 Lumbarsympathetic RFA(Done on 12/26/16) Possible Racz procedure Diagnostic cervical epidural steroidinjection  Diagnostic bilateral cervical facet block  Possible bilateral cervical facet RFA Diagnostic bilateral lumbar facet block Possible bilateral lumbar facet RFA Diagnostic right intra-articular shoulder joint injection Diagnostic right suprascapular nerveblock Possibleright suprascapular RFA Diagnostic bilateral intra-articular knee joint injection Possible bilateral series of 5 Hyalgan knee injections Diagnostic bilateral Genicular nerve block Possible bilateral Genicular RFA Diagnostic  bilateral greater occipital nerve block Possible bilateral greater occipital nerve RFA Diagnostic bilateral C2 +TON nerve block Possible bilateral C2 + TON RFA   Palliative PRN treatment(s):  Diagnostic Caudal ESI#3   Imaging Orders     DG C-Arm 1-60 Min-No Report  Procedure Orders     Radiofrequency, sympathectomy  Medications ordered for procedure: Meds ordered this encounter  Medications  . lactated ringers infusion 1,000 mL  . midazolam (VERSED) 5 MG/5ML injection 1-2 mg    Make sure Flumazenil is available in the pyxis when using this medication. If oversedation occurs, administer 0.2 mg IV over 15 sec. If after 45 sec no response, administer 0.2 mg again over 1 min; may repeat at 1 min intervals; not to exceed 4 doses (1 mg)  . fentaNYL (SUBLIMAZE) injection 25-50 mcg    Make sure Narcan is available in the pyxis when using this medication. In the event of respiratory depression (RR< 8/min): Titrate NARCAN (naloxone) in increments of 0.1 to 0.2 mg IV at 2-3 minute intervals, until desired degree of reversal.  . bupivacaine-EPINEPHrine (MARCAINE W/ EPI) 0.25% -1:200000 (with pres) injection 10 mL  . lidocaine (XYLOCAINE) 2 % (with pres) injection 400 mg  . dexamethasone (DECADRON) injection 10 mg  . oxyCODONE-acetaminophen (PERCOCET) 5-325 MG tablet    Sig: Take 1 tablet by mouth every 6 (six) hours as needed for severe pain.    Dispense:  28 tablet    Refill:  0    For acute post-operative pain. Not to be refilled. To last 7 days.   Medications administered: We administered midazolam, fentaNYL, lidocaine, and dexamethasone.  See the medical record for exact dosing, route, and time of administration.  New Prescriptions   OXYCODONE-ACETAMINOPHEN (PERCOCET) 5-325 MG TABLET    Take 1 tablet by mouth every 6 (six) hours as needed for severe pain.   Disposition: Discharge home  Discharge Date & Time: 12/26/2016; 1305 hrs.   Physician-requested Follow-up: Return for  post-procedure eval by Dr. Dossie Arbour in 2 wks. Future Appointments Date Time Provider [place]  01/13/2017 11:30 AM Milinda Pointer, MD [facility] None  01/13/2017 2:15 PM Daron Offer, Richard Miu, MD [facility] None  03/25/2017 11:00 AM Vevelyn Francois, NP [facility] None   Primary Care Physician: Boykin Nearing, MD Location: [facility] Outpatient Pain Management Facility Note by: [name], MD Date: 12/26/2016; Time: 2:47 PM  Disclaimer:  Medicine is not an Chief Strategy Officer. The only guarantee in medicine is that nothing is guaranteed. It is important to note that the decision to proceed with  this intervention was based on the information collected from the patient. The Data and conclusions were drawn from the patient's questionnaire, the interview, and the physical examination. Because the information was provided in large part by the patient, it cannot be guaranteed that it has not been purposely or unconsciously manipulated. Every effort has been made to obtain as much relevant data as possible for this evaluation. It is important to note that the conclusions that lead to this procedure are derived in large part from the available data. Always take into account that the treatment will also be dependent on availability of resources and existing treatment guidelines, considered by other Pain Management Practitioners as being common knowledge and practice, at the time of the intervention. For Medico-Legal purposes, it is also important to point out that variation in procedural techniques and pharmacological choices are the acceptable norm. The indications, contraindications, technique, and results of the above procedure should only be interpreted and judged by a Board-Certified Interventional Pain Specialist with extensive familiarity and expertise in the same exact procedure and technique.

## 2016-12-27 ENCOUNTER — Telehealth: Payer: Self-pay

## 2016-12-27 NOTE — Telephone Encounter (Signed)
Phone unavailabe

## 2017-01-01 ENCOUNTER — Telehealth: Payer: Self-pay | Admitting: Pain Medicine

## 2017-01-01 NOTE — Telephone Encounter (Signed)
Called patient back to confirm which pharmacy he used.  Patient states he is still having pain and was only able to get 5 days worth of a 7 day Rx.  Patient is completely out of pain medicine.  I did tell patient that I wished he would have called me sooner re; this.  States he was hoping he would not need it. States he will be glad to come and pick up another Rx.  I did tell him that he may have to come in for assessment.   Call to [facility], [phone] to verify fill of only 5 days.. Representative states that [facility] has a new policy re; opiods and to help the opiod crisis.  Explained that we are a chronic pain clinic and I do understand the rules for PCP and possibly ortho but we prescribe 30 days at a time for our chronic pain patients.  She then stated that they could fill for only 7 days, I then asked why they didn't fill Rx and she stated the pharmacist didn't look at that.   I called back and spoke with pharmacist, shannon and she states that they didn't notice that this was for postprocedural pain and can't reactivate the Rx.

## 2017-01-01 NOTE — Telephone Encounter (Signed)
Patient called stating he could only get 5 days of meds filled after RF and is still having significant pain. Please call

## 2017-01-01 NOTE — Telephone Encounter (Signed)
0800 am for assessment

## 2017-01-01 NOTE — Telephone Encounter (Signed)
Patient called back to come in tomorrow at 0800.  Juliann Pulse will add to Crystal's schedule.

## 2017-01-02 ENCOUNTER — Ambulatory Visit: Payer: Self-pay | Attending: Nurse Practitioner | Admitting: Nurse Practitioner

## 2017-01-02 ENCOUNTER — Encounter: Payer: Self-pay | Admitting: Nurse Practitioner

## 2017-01-02 VITALS — BP 130/85 | HR 61 | Resp 18 | Ht 76.0 in | Wt 215.0 lb

## 2017-01-02 DIAGNOSIS — G8918 Other acute postprocedural pain: Secondary | ICD-10-CM

## 2017-01-02 DIAGNOSIS — M542 Cervicalgia: Secondary | ICD-10-CM | POA: Insufficient documentation

## 2017-01-02 DIAGNOSIS — I472 Ventricular tachycardia: Secondary | ICD-10-CM | POA: Insufficient documentation

## 2017-01-02 DIAGNOSIS — J320 Chronic maxillary sinusitis: Secondary | ICD-10-CM | POA: Insufficient documentation

## 2017-01-02 DIAGNOSIS — R208 Other disturbances of skin sensation: Secondary | ICD-10-CM | POA: Insufficient documentation

## 2017-01-02 DIAGNOSIS — K219 Gastro-esophageal reflux disease without esophagitis: Secondary | ICD-10-CM | POA: Insufficient documentation

## 2017-01-02 DIAGNOSIS — M25552 Pain in left hip: Secondary | ICD-10-CM | POA: Insufficient documentation

## 2017-01-02 DIAGNOSIS — G629 Polyneuropathy, unspecified: Secondary | ICD-10-CM | POA: Insufficient documentation

## 2017-01-02 DIAGNOSIS — M79672 Pain in left foot: Secondary | ICD-10-CM | POA: Insufficient documentation

## 2017-01-02 DIAGNOSIS — E785 Hyperlipidemia, unspecified: Secondary | ICD-10-CM | POA: Insufficient documentation

## 2017-01-02 DIAGNOSIS — E876 Hypokalemia: Secondary | ICD-10-CM | POA: Insufficient documentation

## 2017-01-02 DIAGNOSIS — M25562 Pain in left knee: Secondary | ICD-10-CM | POA: Insufficient documentation

## 2017-01-02 DIAGNOSIS — M545 Low back pain: Secondary | ICD-10-CM | POA: Insufficient documentation

## 2017-01-02 DIAGNOSIS — G894 Chronic pain syndrome: Secondary | ICD-10-CM

## 2017-01-02 DIAGNOSIS — I426 Alcoholic cardiomyopathy: Secondary | ICD-10-CM | POA: Insufficient documentation

## 2017-01-02 DIAGNOSIS — R45851 Suicidal ideations: Secondary | ICD-10-CM | POA: Insufficient documentation

## 2017-01-02 DIAGNOSIS — F331 Major depressive disorder, recurrent, moderate: Secondary | ICD-10-CM | POA: Insufficient documentation

## 2017-01-02 DIAGNOSIS — Z8371 Family history of colonic polyps: Secondary | ICD-10-CM | POA: Insufficient documentation

## 2017-01-02 DIAGNOSIS — K589 Irritable bowel syndrome without diarrhea: Secondary | ICD-10-CM | POA: Insufficient documentation

## 2017-01-02 DIAGNOSIS — F1020 Alcohol dependence, uncomplicated: Secondary | ICD-10-CM | POA: Insufficient documentation

## 2017-01-02 DIAGNOSIS — F10129 Alcohol abuse with intoxication, unspecified: Secondary | ICD-10-CM | POA: Insufficient documentation

## 2017-01-02 DIAGNOSIS — M79605 Pain in left leg: Secondary | ICD-10-CM | POA: Insufficient documentation

## 2017-01-02 DIAGNOSIS — I10 Essential (primary) hypertension: Secondary | ICD-10-CM | POA: Insufficient documentation

## 2017-01-02 DIAGNOSIS — R5382 Chronic fatigue, unspecified: Secondary | ICD-10-CM | POA: Insufficient documentation

## 2017-01-02 DIAGNOSIS — Z803 Family history of malignant neoplasm of breast: Secondary | ICD-10-CM | POA: Insufficient documentation

## 2017-01-02 DIAGNOSIS — N529 Male erectile dysfunction, unspecified: Secondary | ICD-10-CM | POA: Insufficient documentation

## 2017-01-02 DIAGNOSIS — G8929 Other chronic pain: Secondary | ICD-10-CM

## 2017-01-02 DIAGNOSIS — M47816 Spondylosis without myelopathy or radiculopathy, lumbar region: Secondary | ICD-10-CM

## 2017-01-02 DIAGNOSIS — Z79891 Long term (current) use of opiate analgesic: Secondary | ICD-10-CM | POA: Insufficient documentation

## 2017-01-02 DIAGNOSIS — M25551 Pain in right hip: Secondary | ICD-10-CM | POA: Insufficient documentation

## 2017-01-02 DIAGNOSIS — M25511 Pain in right shoulder: Secondary | ICD-10-CM | POA: Insufficient documentation

## 2017-01-02 DIAGNOSIS — M7918 Myalgia, other site: Secondary | ICD-10-CM | POA: Insufficient documentation

## 2017-01-02 DIAGNOSIS — M25561 Pain in right knee: Secondary | ICD-10-CM | POA: Insufficient documentation

## 2017-01-02 DIAGNOSIS — F411 Generalized anxiety disorder: Secondary | ICD-10-CM | POA: Insufficient documentation

## 2017-01-02 DIAGNOSIS — M533 Sacrococcygeal disorders, not elsewhere classified: Secondary | ICD-10-CM | POA: Insufficient documentation

## 2017-01-02 DIAGNOSIS — E559 Vitamin D deficiency, unspecified: Secondary | ICD-10-CM | POA: Insufficient documentation

## 2017-01-02 DIAGNOSIS — F1120 Opioid dependence, uncomplicated: Secondary | ICD-10-CM | POA: Insufficient documentation

## 2017-01-02 DIAGNOSIS — M5442 Lumbago with sciatica, left side: Secondary | ICD-10-CM

## 2017-01-02 DIAGNOSIS — J449 Chronic obstructive pulmonary disease, unspecified: Secondary | ICD-10-CM | POA: Insufficient documentation

## 2017-01-02 DIAGNOSIS — M79671 Pain in right foot: Secondary | ICD-10-CM | POA: Insufficient documentation

## 2017-01-02 DIAGNOSIS — N400 Enlarged prostate without lower urinary tract symptoms: Secondary | ICD-10-CM | POA: Insufficient documentation

## 2017-01-02 DIAGNOSIS — M5441 Lumbago with sciatica, right side: Secondary | ICD-10-CM

## 2017-01-02 DIAGNOSIS — Q761 Klippel-Feil syndrome: Secondary | ICD-10-CM | POA: Insufficient documentation

## 2017-01-02 DIAGNOSIS — M79604 Pain in right leg: Secondary | ICD-10-CM | POA: Insufficient documentation

## 2017-01-02 MED ORDER — OXYCODONE-ACETAMINOPHEN 5-325 MG PO TABS: 1.0000 | ORAL_TABLET | Freq: Four times a day (QID) | ORAL | 0 refills | Status: AC | PRN

## 2017-01-02 NOTE — Progress Notes (Signed)
Nursing Pain Medication Assessment:  Safety precautions to be maintained throughout the outpatient stay will include: orient to surroundings, keep bed in low position, maintain call bell within reach at all times, provide assistance with transfer out of bed and ambulation.  Medication Inspection Compliance: Pill count conducted under aseptic conditions, in front of the patient. Neither the pills nor the bottle was removed from the patient's sight at any time. Once count was completed pills were immediately returned to the patient in their original bottle.  Medication: Oxycodone IR Pill/Patch Count: 0 of 20 pills remain Pill/Patch Appearance: Markings consistent with prescribed medication Bottle Appearance: Standard pharmacy container. Clearly labeled. Filled Date: 21 / 18 / 2018 Last Medication intake:  Yesterday

## 2017-01-02 NOTE — Patient Instructions (Signed)

## 2017-01-02 NOTE — Progress Notes (Signed)
Safety precautions to be maintained throughout the outpatient stay will include: orient to surroundings, keep bed in low position, maintain call bell within reach at all times, provide assistance with transfer out of bed and ambulation.  

## 2017-01-02 NOTE — Progress Notes (Signed)
Patient's Name: Mark [name]  MRN: [id number]  Referring Provider: Boykin Nearing, MD  DOB: 1965-09-30  PCP: Boykin Nearing, MD  DOS: 01/02/2017  Note by: Vevelyn Francois NP  Service setting: Ambulatory outpatient  Specialty: Interventional Pain Management  Location: [facility] (AMB) Pain Management Facility    Patient type: Established    Primary Reason(s) for Visit: Evaluation of chronic illnesses with exacerbation, or progression (Level of risk: moderate) CC: Hip Pain (right and left); Foot Pain (bilateral); Back Pain (low); and Leg Pain (bilateral)  HPI  Mark [name] is a 51 y.o. year old, male patient, who comes today for a follow-up evaluation. 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 ([place]); Alcohol intoxication ([place]); Chronic low back pain (Location of Secondary source of pain) (Bilateral) (R>L); Chronic pain syndrome; Paroxysmal VT ([place]); Opioid dependence ([place]); Long Q-T syndrome; Hypokalemia; Alcohol use disorder, severe, dependence ([place]); Chronic shoulder pain (Right); Chronic radicular low back pain (Location of Tertiary source of pain) (Right) (to calf); Closed fracture of 5th metacarpal; Chronic fatigue; Vitamin D insufficiency; Poor dentition; Chronic maxillary sinusitis; Tender prostate; Erectile dysfunction; Chronic anxiety; GERD (gastroesophageal reflux disease); Suicidal ideation; Non-sustained ventricular tachycardia ([place]); GAD (generalized anxiety disorder); Alcohol use disorder, severe, in early remission ([place]); Long term current use of opiate analgesic; Long term prescription opiate use; History of fusion of cervical spine; Chronic neck pain (Bilateral)(R>L); Cervical fusion syndrome; Failed back surgical syndrome; Chronic knee pain  (Bilateral) (R>L); Major depressive disorder, recurrent episode, moderate ([place]); Musculoskeletal pain; 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 Mark [name] was last seen on 12/23/2016. His primarily concern today is the Hip Pain (right and left); Foot Pain (bilateral); Back Pain (low); and Leg Pain (bilateral)  Pain Assessment: Location: Right, Left Hip Radiating: legs and feet Onset: In the past 7 days Duration: Chronic pain Quality: Aching, Constant, Sharp, Shooting Severity: 8 /10 (self-reported pain score)  Note: Reported level is compatible with observation. Clinically the patient looks like a 3/10 Mark [name] does not seem to understand the use of our objective pain scale When using our objective Pain Scale, levels between 6 and 10/10 are said to belong in an emergency room, as it progressively worsens from a 6/10, described as severely limiting, requiring emergency care not usually available at an outpatient pain management facility. At a 6/10 level, communication becomes difficult and requires great effort. Assistance to reach the emergency department may be required. Facial flushing and profuse sweating along with potentially dangerous increases in heart rate and blood pressure will be evident. Effect on ADL:   Timing: Constant Modifying factors:    Further details on both, my assessment(s), as well as the proposed treatment plan, please see below. He admits that he is not able to get comfortable. He admits that he has new pains since the RFA.Marland Kitchen He states that he is unable to rest. He was not to able to get his medication filled for 7 days. The pharmacy filled it for 5 days. He is not sure what happened. He is using the Gabapentin QID.   Laboratory  Chemistry  Inflammation Markers (CRP: Acute Phase) (ESR: Chronic Phase) Lab Results  Component Value Date   CRP 0.9  03/18/2016   ESRSEDRATE 2 03/18/2016                 Renal Function Markers Lab Results  Component Value Date   BUN 5 (L) 05/15/2016   CREATININE 0.72 05/15/2016   GFRAA >60 05/15/2016   GFRNONAA >60 05/15/2016                 Hepatic Function Markers Lab Results  Component Value Date   AST 30 05/15/2016   ALT 40 05/15/2016   ALBUMIN 4.7 05/15/2016   ALKPHOS 87 05/15/2016   HCVAB NEGATIVE 06/14/2012                 Electrolytes Lab Results  Component Value Date   NA 138 05/15/2016   K 3.4 (L) 05/15/2016   CL 101 05/15/2016   CALCIUM 9.0 05/15/2016   MG 2.1 03/18/2016                 Neuropathy Markers Lab Results  Component Value Date   VITAMINB12 244 03/18/2016                 Bone Pathology Markers Lab Results  Component Value Date   ALKPHOS 87 05/15/2016   VD25OH 21 (L) 07/04/2014   25OHVITD1 35 03/18/2016   25OHVITD2 1.0 03/18/2016   25OHVITD3 34 03/18/2016   CALCIUM 9.0 05/15/2016   TESTOFREE 78.9 09/16/2014   TESTOSTERONE 365 09/16/2014                 Coagulation Parameters Lab Results  Component Value Date   INR 1.04 06/07/2013   LABPROT 13.4 06/07/2013   PLT 298 05/15/2016                 Cardiovascular Markers Lab Results  Component Value Date   BNP 23.9 02/27/2015   HGB 17.7 (H) 05/15/2016   HCT 49.2 05/15/2016                 Note: Lab results reviewed.   Meds   Current Outpatient Prescriptions:  .  cyclobenzaprine (FLEXERIL) 10 MG tablet, Take 1 tablet (10 mg total) by mouth at bedtime., Disp: 30 tablet, Rfl: 2 .  gabapentin (NEURONTIN) 800 MG tablet, Take 1 tablet (800 mg total) by mouth 4 (four) times daily., Disp: 120 tablet, Rfl: 2 .  guanFACINE (TENEX) 1 MG tablet, Take 1 tablet (1 mg total) by mouth 3 (three) times daily., Disp: 90 tablet, Rfl: 2 .  metoprolol (LOPRESSOR) 50 MG tablet, Take 1 tablet (50  mg total) by mouth 2 (two) times daily., Disp: 60 tablet, Rfl: 11 .  oxyCODONE-acetaminophen (PERCOCET) 5-325 MG tablet, Take 1 tablet by mouth every 6 (six) hours as needed for severe pain., Disp: 28 tablet, Rfl: 0 .  traMADol (ULTRAM) 50 MG tablet, Take 2 tablets (100 mg total) by mouth 4 (four) times daily. 1-2 tablets, Disp: 240 tablet, Rfl: 2 .  VENTOLIN HFA 108 (90 Base) MCG/ACT inhaler, INHALE 2 PUFFS INTO THE LUNGS EVERY 4 HOURS AS NEEDED FOR WHEEZING OR SHORTNESS OF BREATH., Disp: 18 g, Rfl: 1  ROS  Constitutional: Denies any fever or chills Gastrointestinal: No reported hemesis, hematochezia, vomiting, or acute GI distress Musculoskeletal: Denies any acute onset joint swelling, redness, loss of ROM, or weakness Neurological: No reported episodes of acute onset apraxia, aphasia, dysarthria, agnosia, amnesia, paralysis, loss of coordination, or loss of consciousness  Allergies  Mark [name] is allergic to cozaar [losartan] and lisinopril.  PFSH  Drug:  Mark [name]  reports that he does not use drugs. Alcohol:  reports that he does not drink alcohol. Tobacco:  reports that he has been smoking Cigarettes.  He has a 15.00 pack-year smoking history. He has never used smokeless tobacco. Medical:  has a past medical history of Alcohol withdrawal ([place]); Allergy; Anxiety (Dx 2014); COPD (chronic obstructive pulmonary disease) ([place]); Drug abuse ([place]); Enlarged prostate; ETOH abuse; GERD (gastroesophageal reflux disease) (Dx 2003); Headache(784.0); Hepatitis; Hyperlipidemia (Dx 2000); Hypertension (Dx 2011); IBS (irritable bowel syndrome); Irregular heart beat; Long Q-T syndrome (01/23/2014); Mental disorder; Neuromuscular disorder ([place]); Neuropathy (06/07/2013); Seizure due to alcohol withdrawal ([place]) (01/23/2014); Shortness of breath; and Withdrawal seizures ([place]) (2014). Surgical: Mark [name]  has a past surgical history that includes Back surgery (1995, 1999); Neck surgery (2000); Nasal sinus  surgery; left heart catheterization with coronary angiogram (N/A, 01/13/2014); Subacromial decompression (Right, 11/16/2014); Nissen fundoplication; 24 hour ph study (N/A, 01/16/2015); Upper gastrointestinal endoscopy; and Colonoscopy (1998). Family: family history includes Alcohol abuse in his father and paternal grandfather; Breast cancer in his paternal grandmother; Colon polyps in his mother; Hypertension in his mother; Lung cancer in his father.  Constitutional Exam  General appearance: Well nourished, well developed, and well hydrated. In no apparent acute distress Vitals:   01/02/17 0811  BP: 130/85  Pulse: 61  Resp: 18  SpO2: 100%  Weight: 215 lb (97.5 kg)  Height: _0  (1.93 m)   BMI Assessment: Estimated body mass index is 26.17 kg/m as calculated from the following:   Height as of this encounter: _1  (1.93 m).   Weight as of this encounter: 215 lb (97.5 kg).  BMI interpretation table: BMI level Category Range association with higher incidence of chronic pain  <18 kg/m2 Underweight   18.5-24.9 kg/m2 Ideal body weight   25-29.9 kg/m2 Overweight Increased incidence by 20%  30-34.9 kg/m2 Obese (Class I) Increased incidence by 68%  35-39.9 kg/m2 Severe obesity (Class II) Increased incidence by 136%  >40 kg/m2 Extreme obesity (Class III) Increased incidence by 254%   BMI Readings from Last 4 Encounters:  01/02/17 26.17 kg/m  12/26/16 26.17 kg/m  12/23/16 26.17 kg/m  09/26/16 26.17 kg/m   Wt Readings from Last 4 Encounters:  01/02/17 215 lb (97.5 kg)  12/26/16 215 lb (97.5 kg)  12/23/16 215 lb (97.5 kg)  09/26/16 215 lb (97.5 kg)  Psych/Mental status: Alert, oriented x 3 (person, place, & time)       Eyes: PERLA Respiratory: No evidence of acute respiratory distress  Cervical Spine Area Exam  Skin & Axial Inspection: No masses, redness, edema, swelling, or associated skin lesions Alignment: Symmetrical Functional ROM: Unrestricted ROM      Stability: No  instability detected Muscle Tone/Strength: Functionally intact. No obvious neuro-muscular anomalies detected. Sensory (Neurological): Unimpaired Palpation: No palpable anomalies              Upper Extremity (UE) Exam    Side: Right upper extremity  Side: Left upper extremity  Skin & Extremity Inspection: Skin color, temperature, and hair growth are WNL. No peripheral edema or cyanosis. No masses, redness, swelling, asymmetry, or associated skin lesions. No contractures.  Skin & Extremity Inspection: Skin color, temperature, and hair growth are WNL. No peripheral edema or cyanosis. No masses, redness, swelling, asymmetry, or associated skin lesions. No contractures.  Functional ROM: Unrestricted ROM          Functional ROM: Unrestricted ROM          Muscle Tone/Strength: Functionally intact.  No obvious neuro-muscular anomalies detected.  Muscle Tone/Strength: Functionally intact. No obvious neuro-muscular anomalies detected.  Sensory (Neurological): Unimpaired          Sensory (Neurological): Unimpaired          Palpation: No palpable anomalies              Palpation: No palpable anomalies              Specialized Test(s): Deferred         Specialized Test(s): Deferred          Thoracic Spine Area Exam  Skin & Axial Inspection: No masses, redness, or swelling Alignment: Symmetrical Functional ROM: Unrestricted ROM Stability: No instability detected Muscle Tone/Strength: Functionally intact. No obvious neuro-muscular anomalies detected. Sensory (Neurological): Unimpaired Muscle strength & Tone: No palpable anomalies  Lumbar Spine Area Exam  Skin & Axial Inspection: No masses, redness, or swelling Alignment: Symmetrical Functional ROM: Unrestricted ROM      Stability: No instability detected Muscle Tone/Strength: Functionally intact. No obvious neuro-muscular anomalies detected. Sensory (Neurological): Unimpaired Palpation: Complains of area being tender to palpation       Provocative  Tests: Lumbar Hyperextension and rotation test: evaluation deferred today       Lumbar Lateral bending test: evaluation deferred today       Patrick's Maneuver: evaluation deferred today                    Gait & Posture Assessment  Ambulation: Unassisted Gait: Relatively normal for age and body habitus Posture: WNL   Lower Extremity Exam    Side: Right lower extremity  Side: Left lower extremity  Skin & Extremity Inspection: Skin color, temperature, and hair growth are WNL. No peripheral edema or cyanosis. No masses, redness, swelling, asymmetry, or associated skin lesions. No contractures.  Skin & Extremity Inspection: Skin color, temperature, and hair growth are WNL. No peripheral edema or cyanosis. No masses, redness, swelling, asymmetry, or associated skin lesions. No contractures.  Functional ROM: Unrestricted ROM          Functional ROM: Unrestricted ROM          Muscle Tone/Strength: Functionally intact. No obvious neuro-muscular anomalies detected.  Muscle Tone/Strength: Functionally intact. No obvious neuro-muscular anomalies detected.  Sensory (Neurological): Unimpaired  Sensory (Neurological): Unimpaired  Palpation: No palpable anomalies  Palpation: No palpable anomalies   Assessment  Primary Diagnosis & Pertinent Problem List: The primary encounter diagnosis was Chronic low back pain (Location of Secondary source of pain) (Bilateral) (R>L). Diagnoses of Acute postoperative pain, Lumbar facet syndrome (Right), and Chronic pain syndrome were also pertinent to this visit.  Status Diagnosis  Persistent Persistent Persistent 1. Chronic low back pain (Location of Secondary source of pain) (Bilateral) (R>L)   2. Acute postoperative pain   3. Lumbar facet syndrome (Right)   4. Chronic pain syndrome     Problems updated and reviewed during this visit: No problems updated. Plan of Care  Pharmacotherapy (Medications Ordered): Meds ordered this encounter  Medications  .  oxyCODONE-acetaminophen (PERCOCET) 5-325 MG tablet    Sig: Take 1 tablet by mouth every 6 (six) hours as needed for severe pain.    Dispense:  28 tablet    Refill:  0    For acute post-operative pain.  To last 7 days., May fill 01/02/17    Order Specific Question:   Supervising Provider    Answer:   Milinda Pointer [phone]   New Prescriptions   No medications on  file   Medications administered today: Mark [name] had no medications administered during this visit. Lab-work, procedure(s), and/or referral(s): No orders of the defined types were placed in this encounter.  Imaging and/or referral(s): None  Interventional therapies: Planned, scheduled, and/or pending:  Not at this time.   Considering:  Lower extremity EMG/PNCV. (Radiculopathy versus peripheral neuropathy) Upper extremity EMG/PNCV.  Possibleright-sided L3 + L4 Lumbarsympathetic RFA Possible Racz procedure Diagnostic cervical epidural steroidinjection  Diagnostic bilateral cervical facet block  Possible bilateral cervical facet RFA Diagnostic bilateral lumbar facet block Possible bilateral lumbar facet RFA Diagnostic right intra-articular shoulder joint injection Diagnostic right suprascapular nerveblock Possibleright suprascapular RFA Diagnostic bilateral intra-articular knee joint injection Possible bilateral series of 5 [place] knee injections Diagnostic bilateral Genicular nerve block Possible bilateral Genicular RFA Diagnostic bilateral greater occipital nerve block Possible bilateral greater occipital nerve RFA Diagnostic bilateral C2 +TON nerve block Possible bilateral C2 + TON RFA   Palliative PRN treatment(s):  Diagnostic Caudal ESI#3 Diagnostic right-sided L3 + L4 lumbar sympathetic block#3    Provider-requested follow-up: No Follow-up on file.  Future Appointments Date Time Provider [place]  01/13/2017 11:30 AM Milinda Pointer, MD [facility] None   01/13/2017 2:15 PM Daron Offer, Richard Miu, MD [facility] None  03/25/2017 11:00 AM Vevelyn Francois, NP [facility] None   Primary Care Physician: Boykin Nearing, MD Location: [facility] Note by: Vevelyn Francois NP Date: 01/02/2017; Time: 12:45 PM  Pain Score Disclaimer: We use the NRS-11 scale. This is a self-reported, subjective measurement of pain severity with only modest accuracy. It is used primarily to identify changes within a particular patient. It must be understood that outpatient pain scales are significantly less accurate that those used for research, where they can be applied under ideal controlled circumstances with minimal exposure to variables. In reality, the score is likely to be a combination of pain intensity and pain affect, where pain affect describes the degree of emotional arousal or changes in action readiness caused by the sensory experience of pain. Factors such as social and work situation, setting, emotional state, anxiety levels, expectation, and prior pain experience may influence pain perception and show large inter-individual differences that may also be affected by time variables.  Patient instructions provided during this appointment: Patient Instructions   ____________________________________________________________________________________________  Medication Rules  Applies to: All patients receiving prescriptions (written or electronic).  Pharmacy of record: Pharmacy where electronic prescriptions will be sent. If written prescriptions are taken to a different pharmacy, please inform the nursing staff. The pharmacy listed in the electronic medical record should be the one where you would like electronic prescriptions to be sent.  Prescription refills: Only during scheduled appointments. Applies to both, written and electronic prescriptions.  NOTE: The following applies primarily to controlled substances (Opioid* Pain Medications).    Patient's responsibilities: 1. Pain Pills: Bring all pain pills to every appointment (except for procedure appointments). 2. Pill Bottles: Bring pills in original pharmacy bottle. Always bring newest bottle. Bring bottle, even if empty. 3. Medication refills: You are responsible for knowing and keeping track of what medications you need refilled. The day before your appointment, write a list of all prescriptions that need to be refilled. Bring that list to your appointment and give it to the admitting nurse. Prescriptions will be written only during appointments. If you forget a medication, it will not be "Called in", "Faxed", or "electronically sent". You will need to get another appointment to get these prescribed. 4. Prescription Accuracy: You are responsible  for carefully inspecting your prescriptions before leaving our office. Have the discharge nurse carefully go over each prescription with you, before taking them home. Make sure that your name is accurately spelled, that your address is correct. Check the name and dose of your medication to make sure it is accurate. Check the number of pills, and the written instructions to make sure they are clear and accurate. Make sure that you are given enough medication to last until your next medication refill appointment. 5. Taking Medication: Take medication as prescribed. Never take more pills than instructed. Never take medication more frequently than prescribed. Taking less pills or less frequently is permitted and encouraged, when it comes to controlled substances (written prescriptions).  6. Inform other Doctors: Always inform, all of your healthcare providers, of all the medications you take. 7. Pain Medication from other Providers: You are not allowed to accept any additional pain medication from any other Doctor or Healthcare provider. There are two exceptions to this rule. (see below) In the event that you require additional pain medication, you  are responsible for notifying us, as stated below. 8. Medication Agreement: You are responsible for carefully reading and following our Medication Agreement. This must be signed before receiving any prescriptions from our practice. Safely store a copy of your signed Agreement. Violations to the Agreement will result in no further prescriptions. (Additional copies of our Medication Agreement are available upon request.) 9. Laws, Rules, & Regulations: All patients are expected to follow all Federal and [employer], Rules, [employer]. Ignorance of the Laws does not constitute a valid excuse. The use of any illegal substances is prohibited. 10. Adopted CDC guidelines & recommendations: Target dosing levels will be at or below 60 MME/day. Use of benzodiazepines** is not recommended.  Exceptions: There are only two exceptions to the rule of not receiving pain medications from other Healthcare Providers. 1. Exception #1 (Emergencies): In the event of an emergency (i.e.: accident requiring emergency care), you are allowed to receive additional pain medication. However, you are responsible for: As soon as you are able, call our office [phone], at any time of the day or night, and leave a message stating your name, the date and nature of the emergency, and the name and dose of the medication prescribed. In the event that your call is answered by a member of our staff, make sure to document and save the date, time, and the name of the person that took your information.  2. Exception #2 (Planned Surgery): In the event that you are scheduled by another doctor or dentist to have any type of surgery or procedure, you are allowed (for a period no longer than 30 days), to receive additional pain medication, for the acute post-op pain. However, in this case, you are responsible for picking up a copy of our "Post-op Pain Management for Surgeons" handout, and giving it to your surgeon or dentist. This document  is available at our office, and does not require an appointment to obtain it. Simply go to our office during business hours (Monday-Thursday from 8:00 AM to 4:00 PM) (Friday 8:00 AM to 12:00 Noon) or if you have a scheduled appointment with [place], prior to your surgery, and ask for it by name. In addition, you will need to provide [place] with your name, name of your surgeon, type of surgery, and date of procedure or surgery.  *Opioid medications include: morphine, codeine, oxycodone, oxymorphone, hydrocodone, hydromorphone, meperidine, tramadol, tapentadol, buprenorphine, fentanyl, methadone. **Benzodiazepine medications  include: diazepam (Valium), alprazolam (Xanax), clonazepam (Klonopine), lorazepam (Ativan), clorazepate (Tranxene), chlordiazepoxide (Librium), estazolam (Prosom), oxazepam (Serax), temazepam (Restoril), triazolam (Halcion)  ____________________________________________________________________________________________

## 2017-01-10 MED FILL — LOSARTAN POTASSIUM 50 MG TA: 50 | 30 days supply | Qty: 30 | Fill #0

## 2017-01-10 MED FILL — ?METOPROLOL 50 MG TABLET: 50 | 30 days supply | Qty: 90 | Fill #3

## 2017-01-13 ENCOUNTER — Ambulatory Visit (HOSPITAL_COMMUNITY): Payer: Self-pay | Admitting: Psychiatry

## 2017-01-13 ENCOUNTER — Ambulatory Visit: Payer: Self-pay | Attending: Pain Medicine | Admitting: Pain Medicine

## 2017-01-13 NOTE — Progress Notes (Deleted)
Patient's Name: Mark [name]  MRN: [id number]  Referring Provider: Boykin Nearing, MD  DOB: 07-Mar-1966  PCP: Boykin Nearing, MD  DOS: 01/13/2017  Note by: [name], MD  Service setting: Ambulatory outpatient  Specialty: Interventional Pain Management  Location: [facility] (AMB) Pain Management Facility    Patient type: Established   Primary Reason(s) for Visit: Encounter for post-procedure evaluation of chronic illness with mild to moderate exacerbation CC: No chief complaint on file.  HPI  Mark [name] is a 51 y.o. year old, male patient, who comes today for a post-procedure evaluation. 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 ([place]); Alcohol intoxication ([place]); Chronic low back pain (Location of Secondary source of pain) (Bilateral) (R>L); Chronic pain syndrome; Paroxysmal VT ([place]); Opioid dependence ([place]); Long Q-T syndrome; Hypokalemia; Alcohol use disorder, severe, dependence ([place]); Chronic shoulder pain (Right); Chronic radicular low back pain (Location of Tertiary source of pain) (Right) (to calf); Closed fracture of 5th metacarpal; Chronic fatigue; Vitamin D insufficiency; Poor dentition; Chronic maxillary sinusitis; Tender prostate; Erectile dysfunction; Chronic anxiety; GERD (gastroesophageal reflux disease); Suicidal ideation; Non-sustained ventricular tachycardia ([place]); GAD (generalized anxiety disorder); Alcohol use disorder, severe, in early remission ([place]); Long term current use of opiate analgesic; Long term prescription opiate use; History of fusion of cervical spine; Chronic neck pain (Bilateral)(R>L); Cervical fusion syndrome; Failed back surgical syndrome; Chronic knee pain  (Bilateral) (R>L); Major depressive disorder, recurrent episode, moderate ([place]); Musculoskeletal pain; 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 His primarily concern today is the No chief complaint on file.  Pain Assessment: Location:     Radiating:   Onset:   Duration:   Quality:   Severity:  /10 (self-reported pain score)  Note: Reported level is compatible with observation.                         When using our objective Pain Scale, levels between 6 and 10/10 are said to belong in an emergency room, as it progressively worsens from a 6/10, described as severely limiting, requiring emergency care not usually available at an outpatient pain management facility. At a 6/10 level, communication becomes difficult and requires great effort. Assistance to reach the emergency department may be required. Facial flushing and profuse sweating along with potentially dangerous increases in heart rate and blood pressure will be evident. Effect on ADL:   Timing:   Modifying factors:    Mark [name] comes in today for post-procedure evaluation after the treatment done on 01/01/2017.  Further details on both, my assessment(s), as well as the proposed treatment plan, please see below.  Post-Procedure Assessment  01/01/2017 Procedure: Right sided L3 and L4 lumbar sympathetic RFA under fluoroscopic guidance and IV sedation Pre-procedure pain score:  7/10 Post-procedure pain score: 7/10 No relief Influential Factors: BMI:   Intra-procedural challenges: None observed.         Assessment challenges: None detected.              Reported side-effects: None.        Post-procedural adverse reactions or complications: None reported         Sedation: Please see nurses note. When no sedatives  are used, the analgesic  levels obtained are directly associated to the effectiveness of the local anesthetics. However, when sedation is provided, the level of analgesia obtained during the initial 1 hour following the intervention, is believed to be the result of a combination of factors. These factors may include, but are not limited to: 1. The effectiveness of the local anesthetics used. 2. The effects of the analgesic(s) and/or anxiolytic(s) used. 3. The degree of discomfort experienced by the patient at the time of the procedure. 4. The patients ability and reliability in recalling and recording the events. 5. The presence and influence of possible secondary gains and/or psychosocial factors. Reported result: Relief experienced during the 1st hour after the procedure:   (Ultra-Short Term Relief)            Interpretative annotation: Clinically appropriate result. Analgesia during this period is likely to be Local Anesthetic and/or IV Sedative (Analgesic/Anxiolytic) related.          Effects of local anesthetic: The analgesic effects attained during this period are directly associated to the localized infiltration of local anesthetics and therefore cary significant diagnostic value as to the etiological location, or anatomical origin, of the pain. Expected duration of relief is directly dependent on the pharmacodynamics of the local anesthetic used. Long-acting (4-6 hours) anesthetics used.  Reported result: Relief during the next 4 to 6 hour after the procedure:   (Short-Term Relief)            Interpretative annotation: Clinically appropriate result. Analgesia during this period is likely to be Local Anesthetic-related.          Long-term benefit: Defined as the period of time past the expected duration of local anesthetics (1 hour for short-acting and 4-6 hours for long-acting). With the possible exception of prolonged sympathetic blockade from the local anesthetics, benefits during this period are typically attributed  to, or associated with, other factors such as analgesic sensory neuropraxia, antiinflammatory effects, or beneficial biochemical changes provided by agents other than the local anesthetics.  Reported result: Extended relief following procedure:   (Long-Term Relief)            Interpretative annotation: Clinically appropriate result. Good relief. No permanent benefit expected. Inflammation plays a part in the etiology to the pain.          Current benefits: Defined as reported results that persistent at this point in time.   Analgesia: *** %            Function: Somewhat improved ROM: Somewhat improved Interpretative annotation: Recurrence of symptoms. No permanent benefit expected. Effective diagnostic intervention.          Interpretation: Results would suggest a successful diagnostic intervention.                  Plan:  Please see "Plan of Care" for details.        Laboratory Chemistry  Inflammation Markers (CRP: Acute Phase) (ESR: Chronic Phase) Lab Results  Component Value Date   CRP 0.9 03/18/2016   ESRSEDRATE 2 03/18/2016                 Renal Function Markers Lab Results  Component Value Date   BUN 5 (L) 05/15/2016   CREATININE 0.72 05/15/2016   GFRAA >60 05/15/2016   GFRNONAA >60 05/15/2016                 Hepatic Function Markers Lab Results  Component Value Date   AST 30 05/15/2016  ALT 40 05/15/2016   ALBUMIN 4.7 05/15/2016   ALKPHOS 87 05/15/2016   HCVAB NEGATIVE 06/14/2012                 Electrolytes Lab Results  Component Value Date   NA 138 05/15/2016   K 3.4 (L) 05/15/2016   CL 101 05/15/2016   CALCIUM 9.0 05/15/2016   MG 2.1 03/18/2016                 Neuropathy Markers Lab Results  Component Value Date   VITAMINB12 244 03/18/2016                 Bone Pathology Markers Lab Results  Component Value Date   ALKPHOS 87 05/15/2016   VD25OH 21 (L) 07/04/2014   25OHVITD1 35 03/18/2016   25OHVITD2 1.0 03/18/2016   25OHVITD3 34 03/18/2016    CALCIUM 9.0 05/15/2016   TESTOFREE 78.9 09/16/2014   TESTOSTERONE 365 09/16/2014                 Coagulation Parameters Lab Results  Component Value Date   INR 1.04 06/07/2013   LABPROT 13.4 06/07/2013   PLT 298 05/15/2016                 Cardiovascular Markers Lab Results  Component Value Date   BNP 23.9 02/27/2015   HGB 17.7 (H) 05/15/2016   HCT 49.2 05/15/2016                 Note: Lab results reviewed.  Recent Diagnostic Imaging Results  DG C-Arm 1-60 Min-No Report Fluoroscopy was utilized by the requesting physician.  No radiographic  interpretation.   Complexity Note: Imaging results reviewed. Results shared with Mark [name], using Layman's terms.                         Meds   Current Outpatient Medications:  .  cyclobenzaprine (FLEXERIL) 10 MG tablet, Take 1 tablet (10 mg total) by mouth at bedtime., Disp: 30 tablet, Rfl: 2 .  gabapentin (NEURONTIN) 800 MG tablet, Take 1 tablet (800 mg total) by mouth 4 (four) times daily., Disp: 120 tablet, Rfl: 2 .  guanFACINE (TENEX) 1 MG tablet, Take 1 tablet (1 mg total) by mouth 3 (three) times daily., Disp: 90 tablet, Rfl: 2 .  metoprolol (LOPRESSOR) 50 MG tablet, Take 1 tablet (50 mg total) by mouth 2 (two) times daily., Disp: 60 tablet, Rfl: 11 .  traMADol (ULTRAM) 50 MG tablet, Take 2 tablets (100 mg total) by mouth 4 (four) times daily. 1-2 tablets, Disp: 240 tablet, Rfl: 2 .  VENTOLIN HFA 108 (90 Base) MCG/ACT inhaler, INHALE 2 PUFFS INTO THE LUNGS EVERY 4 HOURS AS NEEDED FOR WHEEZING OR SHORTNESS OF BREATH., Disp: 18 g, Rfl: 1  ROS  Constitutional: Denies any fever or chills Gastrointestinal: No reported hemesis, hematochezia, vomiting, or acute GI distress Musculoskeletal: Denies any acute onset joint swelling, redness, loss of ROM, or weakness Neurological: No reported episodes of acute onset apraxia, aphasia, dysarthria, agnosia, amnesia, paralysis, loss of coordination, or loss of consciousness  Allergies  Mr.  [name] is allergic to cozaar [losartan] and lisinopril.  [place]  Drug: Mark [name]  reports that he does not use drugs. Alcohol:  reports that he does not drink alcohol. Tobacco:  reports that he has been smoking cigarettes.  He has a 15.00 pack-year smoking history. he has never used smokeless tobacco. Medical:  has a past medical history  of Alcohol withdrawal ([place]), Allergy, Anxiety (Dx 2014), COPD (chronic obstructive pulmonary disease) ([place]), Drug abuse ([place]), Enlarged prostate, ETOH abuse, GERD (gastroesophageal reflux disease) (Dx 2003), Headache(784.0), Hepatitis, Hyperlipidemia (Dx 2000), Hypertension (Dx 2011), IBS (irritable bowel syndrome), Irregular heart beat, Long Q-T syndrome (01/23/2014), Mental disorder, Neuromuscular disorder ([place]), Neuropathy (06/07/2013), Seizure due to alcohol withdrawal ([place]) (01/23/2014), Shortness of breath, and Withdrawal seizures ([place]) (2014). Surgical: Mark [name]  has a past surgical history that includes Back surgery (1995, 1999); Neck surgery (2000); Nasal sinus surgery; Nissen fundoplication; Upper gastrointestinal endoscopy; Colonoscopy (1998); [place] STUDY (N/A, 01/16/2015); RIGHT SHOULDER ARTHROSCOPY WITH SUBACROMIAL DECOMPRESSION (Right, 11/16/2014); SHOULDER ARTHROSCOPY WITH ROTATOR CUFF REPAIR AND OPEN BICEPS TENODESIS (Right, 11/16/2014); and LEFT HEART CATHETERIZATION WITH CORONARY ANGIOGRAM (N/A, 01/13/2014). Family: family history includes Alcohol abuse in his father and paternal grandfather; Breast cancer in his paternal grandmother; Colon polyps in his mother; Hypertension in his mother; Lung cancer in his father.  Constitutional Exam  General appearance: Well nourished, well developed, and well hydrated. In no apparent acute distress There were no vitals filed for this visit. BMI Assessment: Estimated body mass index is 26.17 kg/m as calculated from the following:   Height as of 01/02/17: '6\' 4"'  (1.93 m).   Weight as of 01/02/17: 215 lb (97.5  kg).  BMI interpretation table: BMI level Category Range association with higher incidence of chronic pain  <18 kg/m2 Underweight   18.5-24.9 kg/m2 Ideal body weight   25-29.9 kg/m2 Overweight Increased incidence by 20%  30-34.9 kg/m2 Obese (Class I) Increased incidence by 68%  35-39.9 kg/m2 Severe obesity (Class II) Increased incidence by 136%  >40 kg/m2 Extreme obesity (Class III) Increased incidence by 254%   BMI Readings from Last 4 Encounters:  01/02/17 26.17 kg/m  12/26/16 26.17 kg/m  12/23/16 26.17 kg/m  09/26/16 26.17 kg/m   Wt Readings from Last 4 Encounters:  01/02/17 215 lb (97.5 kg)  12/26/16 215 lb (97.5 kg)  12/23/16 215 lb (97.5 kg)  09/26/16 215 lb (97.5 kg)  Psych/Mental status: Alert, oriented x 3 (person, place, & time)       Eyes: PERLA Respiratory: No evidence of acute respiratory distress  Cervical Spine Area Exam  Skin & Axial Inspection: No masses, redness, edema, swelling, or associated skin lesions Alignment: Symmetrical Functional ROM: Unrestricted ROM      Stability: No instability detected Muscle Tone/Strength: Functionally intact. No obvious neuro-muscular anomalies detected. Sensory (Neurological): Unimpaired Palpation: No palpable anomalies              Upper Extremity (UE) Exam    Side: Right upper extremity  Side: Left upper extremity  Skin & Extremity Inspection: Skin color, temperature, and hair growth are WNL. No peripheral edema or cyanosis. No masses, redness, swelling, asymmetry, or associated skin lesions. No contractures.  Skin & Extremity Inspection: Skin color, temperature, and hair growth are WNL. No peripheral edema or cyanosis. No masses, redness, swelling, asymmetry, or associated skin lesions. No contractures.  Functional ROM: Unrestricted ROM          Functional ROM: Unrestricted ROM          Muscle Tone/Strength: Functionally intact. No obvious neuro-muscular anomalies detected.  Muscle Tone/Strength: Functionally intact.  No obvious neuro-muscular anomalies detected.  Sensory (Neurological): Unimpaired          Sensory (Neurological): Unimpaired          Palpation: No palpable anomalies              Palpation:  No palpable anomalies              Specialized Test(s): Deferred         Specialized Test(s): Deferred          Thoracic Spine Area Exam  Skin & Axial Inspection: No masses, redness, or swelling Alignment: Symmetrical Functional ROM: Unrestricted ROM Stability: No instability detected Muscle Tone/Strength: Functionally intact. No obvious neuro-muscular anomalies detected. Sensory (Neurological): Unimpaired Muscle strength & Tone: No palpable anomalies  Lumbar Spine Area Exam  Skin & Axial Inspection: No masses, redness, or swelling Alignment: Symmetrical Functional ROM: Unrestricted ROM      Stability: No instability detected Muscle Tone/Strength: Functionally intact. No obvious neuro-muscular anomalies detected. Sensory (Neurological): Unimpaired Palpation: No palpable anomalies       Provocative Tests: Lumbar Hyperextension and rotation test: evaluation deferred today       Lumbar Lateral bending test: evaluation deferred today       Patrick's Maneuver: evaluation deferred today                    Gait & Posture Assessment  Ambulation: Unassisted Gait: Relatively normal for age and body habitus Posture: WNL   Lower Extremity Exam    Side: Right lower extremity  Side: Left lower extremity  Skin & Extremity Inspection: Skin color, temperature, and hair growth are WNL. No peripheral edema or cyanosis. No masses, redness, swelling, asymmetry, or associated skin lesions. No contractures.  Skin & Extremity Inspection: Skin color, temperature, and hair growth are WNL. No peripheral edema or cyanosis. No masses, redness, swelling, asymmetry, or associated skin lesions. No contractures.  Functional ROM: Unrestricted ROM          Functional ROM: Unrestricted ROM          Muscle Tone/Strength:  Functionally intact. No obvious neuro-muscular anomalies detected.  Muscle Tone/Strength: Functionally intact. No obvious neuro-muscular anomalies detected.  Sensory (Neurological): Unimpaired  Sensory (Neurological): Unimpaired  Palpation: No palpable anomalies  Palpation: No palpable anomalies   Assessment  Primary Diagnosis & Pertinent Problem List: There were no encounter diagnoses.  Status Diagnosis  Controlled Controlled Controlled No diagnosis found.  Problems updated and reviewed during this visit: No problems updated. Plan of Care  Pharmacotherapy (Medications Ordered): No orders of the defined types were placed in this encounter. This SmartLink is deprecated. Use AVSMEDLIST instead to display the medication list for a patient. Medications administered today: [name] had no medications administered during this visit.  Procedure Orders    No procedure(s) ordered today   Lab Orders  No laboratory test(s) ordered today   Imaging Orders  No imaging studies ordered today   Referral Orders  No referral(s) requested today    Interventional management options: Planned, scheduled, and/or pending:   ***   Considering:   ***   Palliative PRN treatment(s):   None at this time   Provider-requested follow-up: No Follow-up on file.  Future Appointments  Date Time Provider [place]  01/13/2017 11:30 AM Milinda Pointer, MD [facility] None  01/13/2017  2:15 PM Daron Offer, Richard Miu, MD [facility] None  01/14/2017 11:30 AM [facility] FINANCIAL COUNSELOR [facility] None  02/18/2017  9:00 AM Arnoldo Morale, MD [facility] None  03/25/2017 11:00 AM Vevelyn Francois, NP [facility] None   Primary Care Physician: Boykin Nearing, MD Location: [facility] Note by: [name], MD Date: 01/13/2017; Time: 7:59 AM

## 2017-01-14 ENCOUNTER — Ambulatory Visit: Payer: Self-pay | Attending: Internal Medicine

## 2017-01-16 NOTE — Progress Notes (Signed)
Patient's Name: Mark [name]  MRN: [id number]  Referring Provider: Boykin Nearing, MD  DOB: [birth year]  PCP: Boykin Nearing, MD  DOS: 01/20/2017  Note by: [name], MD  Service setting: Ambulatory outpatient  Specialty: Interventional Pain Management  Location: [facility] (AMB) Pain Management Facility    Patient type: Established   Primary Reason(s) for Visit: Encounter for post-procedure evaluation of chronic illness with mild to moderate exacerbation CC: Back Pain (lower)  HPI  Mark [name] is a 51 y.o. year old, male patient, who comes today for a post-procedure evaluation. 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 ([place]); Alcohol intoxication ([place]); Chronic low back pain (Secondary source of pain) (Bilateral) (R>L); Chronic pain syndrome; Paroxysmal VT ([place]); Opioid dependence ([place]); Long Q-T syndrome; Hypokalemia; Alcohol use disorder, severe, dependence ([place]); Chronic shoulder pain (Right); Chronic radicular low back pain [facility] source of pain) (Right) (to calf); Closed fracture of 5th metacarpal; Chronic fatigue; Vitamin D insufficiency; Poor dentition; Chronic maxillary sinusitis; Tender prostate; Erectile dysfunction; Chronic anxiety; GERD (gastroesophageal reflux disease); Suicidal ideation; Non-sustained ventricular tachycardia ([place]); GAD (generalized anxiety disorder); Alcohol use disorder, severe, in early remission ([place]); Long term current use of opiate analgesic; Long term prescription opiate use; History of fusion of cervical spine; Chronic neck pain (Bilateral)(R>L); Cervical fusion syndrome; Failed back surgical syndrome; Chronic knee pain  (Bilateral) (R>L); Major depressive disorder, recurrent episode, moderate ([place]); Musculoskeletal pain; 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 His primarily concern today is the Back Pain (lower)  Pain Assessment: Location: Lower Back Radiating: backs of both legs, worse on right, inside of the right leg, both hips Onset: More than a month ago Duration: Chronic pain Quality: Constant Severity: 8 /10 (self-reported pain score)  Note: Reported level is compatible with observation.                         When using our objective Pain Scale, levels between 6 and 10/10 are said to belong in an emergency room, as it progressively worsens from a 6/10, described as severely limiting, requiring emergency care not usually available at an outpatient pain management facility. At a 6/10 level, communication becomes difficult and requires great effort. Assistance to reach the emergency department may be required. Facial flushing and profuse sweating along with potentially dangerous increases in heart rate and blood pressure will be evident. Effect on ADL:   Timing: Constant Modifying factors: nothing  Mark [name] comes in today for post-procedure evaluation after the treatment done on 01/13/2017.  Further details on both, my assessment(s), as well as the proposed treatment plan, please see below.  Post-Procedure Assessment  01/13/2017 Procedure: Therapeutic right-sided L3 and L4  Lumbar sympathetic chain radiofrequency ablation under fluoroscopic guidance and IV sedation Pre-procedure pain score:  7/10 Post-procedure pain score: 7/10 No relief Influential Factors: BMI: 26.05 kg/m Intra-procedural challenges: None observed.         Assessment challenges: None detected.              Reported side-effects: None.         Post-procedural adverse reactions or complications: None reported  Sedation: Sedation provided. When no sedatives are used, the analgesic levels obtained are directly associated to the effectiveness of the local anesthetics. However, when sedation is provided, the level of analgesia obtained during the initial 1 hour following the intervention, is believed to be the result of a combination of factors. These factors may include, but are not limited to: 1. The effectiveness of the local anesthetics used. 2. The effects of the analgesic(s) and/or anxiolytic(s) used. 3. The degree of discomfort experienced by the patient at the time of the procedure. 4. The patients ability and reliability in recalling and recording the events. 5. The presence and influence of possible secondary gains and/or psychosocial factors. Reported result: Relief experienced during the 1st hour after the procedure: 0 % (Ultra-Short Term Relief)            Interpretative annotation: Clinically appropriate result. Analgesia during this period is likely to be Local Anesthetic and/or IV Sedative (Analgesic/Anxiolytic) related.          Effects of local anesthetic: The analgesic effects attained during this period are directly associated to the localized infiltration of local anesthetics and therefore cary significant diagnostic value as to the etiological location, or anatomical origin, of the pain. Expected duration of relief is directly dependent on the pharmacodynamics of the local anesthetic used. Long-acting (4-6 hours) anesthetics used.  Reported result: Relief during the next 4 to 6 hour after the procedure: 0 % (Short-Term Relief)            Interpretative annotation: Clinically appropriate result. Analgesia during this period is likely to be Local Anesthetic-related.          Long-term benefit: Defined as the period of time past the expected duration of local anesthetics (1 hour for short-acting and 4-6 hours for  long-acting). With the possible exception of prolonged sympathetic blockade from the local anesthetics, benefits during this period are typically attributed to, or associated with, other factors such as analgesic sensory neuropraxia, antiinflammatory effects, or beneficial biochemical changes provided by agents other than the local anesthetics.  Reported result: Extended relief following procedure: 0 % (Long-Term Relief)            Interpretative annotation: Clinically appropriate result. Good relief. No permanent benefit expected. Inflammation plays a part in the etiology to the pain.          Current benefits: Defined as reported results that persistent at this point in time.   Analgesia: 0 %            Function: Somewhat improved ROM: Somewhat improved Interpretative annotation: Recurrence of symptoms. No permanent benefit expected. Effective diagnostic intervention.          Interpretation: Results would suggest a successful diagnostic intervention.                  Plan:  Please see "Plan of Care" for details.        Laboratory Chemistry  Inflammation Markers (CRP: Acute Phase) (ESR: Chronic Phase) Lab Results  Component Value Date   CRP 0.9 03/18/2016   ESRSEDRATE 2 03/18/2016                 Renal Function Markers Lab Results  Component Value Date   BUN 5 (L) 05/15/2016   CREATININE 0.72 05/15/2016   GFRAA >60 05/15/2016   GFRNONAA >60 05/15/2016                 Hepatic Function Markers Lab Results  Component Value Date  AST 30 05/15/2016   ALT 40 05/15/2016   ALBUMIN 4.7 05/15/2016   ALKPHOS 87 05/15/2016   HCVAB NEGATIVE 06/14/2012                 Electrolytes Lab Results  Component Value Date   NA 138 05/15/2016   K 3.4 (L) 05/15/2016   CL 101 05/15/2016   CALCIUM 9.0 05/15/2016   MG 2.1 03/18/2016                 Neuropathy Markers Lab Results  Component Value Date   VITAMINB12 244 03/18/2016                 Bone Pathology Markers Lab Results   Component Value Date   ALKPHOS 87 05/15/2016   VD25OH 21 (L) 07/04/2014   25OHVITD1 35 03/18/2016   25OHVITD2 1.0 03/18/2016   25OHVITD3 34 03/18/2016   CALCIUM 9.0 05/15/2016   TESTOFREE 78.9 09/16/2014   TESTOSTERONE 365 09/16/2014                 Rheumatology Markers Lab Results  Component Value Date   LABURIC 5.7 12/09/2011                Coagulation Parameters Lab Results  Component Value Date   INR 1.04 06/07/2013   LABPROT 13.4 06/07/2013   PLT 298 05/15/2016   DDIMER <0.27 11/13/2015                 Cardiovascular Markers Lab Results  Component Value Date   BNP 23.9 02/27/2015   CKTOTAL 201 01/22/2012   CKMB 2.5 01/22/2012   TROPONINI <0.03 11/07/2015   HGB 17.7 (H) 05/15/2016   HCT 49.2 05/15/2016                 CA Markers No results found for: CEA, CA125, LABCA2               Note: Lab results reviewed.  Recent Diagnostic Imaging Results  DG C-Arm 1-60 Min-No Report Fluoroscopy was utilized by the requesting physician.  No radiographic  interpretation.   Complexity Note: Imaging results reviewed. Results shared with Mr. Smigiel, using Layman's terms.                         Meds   Current Outpatient Medications:  .  cyclobenzaprine (FLEXERIL) 10 MG tablet, Take 1 tablet (10 mg total) by mouth at bedtime., Disp: 30 tablet, Rfl: 2 .  gabapentin (NEURONTIN) 800 MG tablet, Take 1 tablet (800 mg total) by mouth 4 (four) times daily., Disp: 120 tablet, Rfl: 2 .  losartan (COZAAR) 25 MG tablet, Take 25 mg daily by mouth., Disp: , Rfl:  .  metoprolol (LOPRESSOR) 50 MG tablet, Take 1 tablet (50 mg total) by mouth 2 (two) times daily., Disp: 60 tablet, Rfl: 11 .  oxyCODONE (OXY IR/ROXICODONE) 5 MG immediate release tablet, Take 1 tablet (5 mg total) every 6 (six) hours as needed by mouth for severe pain., Disp: 120 tablet, Rfl: 0 .  VENTOLIN HFA 108 (90 Base) MCG/ACT inhaler, INHALE 2 PUFFS INTO THE LUNGS EVERY 4 HOURS AS NEEDED FOR WHEEZING OR SHORTNESS  OF BREATH., Disp: 18 g, Rfl: 1  ROS  Constitutional: Denies any fever or chills Gastrointestinal: No reported hemesis, hematochezia, vomiting, or acute GI distress Musculoskeletal: Denies any acute onset joint swelling, redness, loss of ROM, or weakness Neurological: No reported episodes of acute onset apraxia, aphasia, dysarthria,  agnosia, amnesia, paralysis, loss of coordination, or loss of consciousness  Allergies  Mr. Schorr is allergic to cozaar [losartan] and lisinopril.  [place]  Drug: Mr. Rask  reports that he does not use drugs. Alcohol:  reports that he does not drink alcohol. Tobacco:  reports that he has been smoking cigarettes.  He has a 15.00 pack-year smoking history. he has never used smokeless tobacco. Medical:  has a past medical history of Alcohol withdrawal ([place]), Allergy, Anxiety (Dx 2014), COPD (chronic obstructive pulmonary disease) ([place]), Drug abuse ([place]), Enlarged prostate, ETOH abuse, GERD (gastroesophageal reflux disease) (Dx 2003), Headache(784.0), Hepatitis, Hyperlipidemia (Dx 2000), Hypertension (Dx 2011), IBS (irritable bowel syndrome), Irregular heart beat, Long Q-T syndrome (01/23/2014), Mental disorder, Neuromuscular disorder ([place]), Neuropathy (06/07/2013), Seizure due to alcohol withdrawal ([place]) (01/23/2014), Shortness of breath, and Withdrawal seizures ([place]) (2014). Surgical: Mr. Nasworthy  has a past surgical history that includes Back surgery (1995, 1999); Neck surgery (2000); Nasal sinus surgery; Nissen fundoplication; Upper gastrointestinal endoscopy; Colonoscopy (1998); [place] STUDY (N/A, 01/16/2015); RIGHT SHOULDER ARTHROSCOPY WITH SUBACROMIAL DECOMPRESSION (Right, 11/16/2014); SHOULDER ARTHROSCOPY WITH ROTATOR CUFF REPAIR AND OPEN BICEPS TENODESIS (Right, 11/16/2014); and LEFT HEART CATHETERIZATION WITH CORONARY ANGIOGRAM (N/A, 01/13/2014). Family: family history includes Alcohol abuse in his father and paternal grandfather; Breast cancer in his paternal  grandmother; Colon polyps in his mother; Hypertension in his mother; Lung cancer in his father.  Constitutional Exam  General appearance: Well nourished, well developed, and well hydrated. In no apparent acute distress Vitals:   01/20/17 1119  BP: (!) 143/88  Pulse: 68  Resp: 16  Temp: 98.5 F (36.9 C)  TempSrc: Oral  SpO2: 97%  Weight: 214 lb (97.1 kg)  Height: _0  (1.93 m)   BMI Assessment: Estimated body mass index is 26.05 kg/m as calculated from the following:   Height as of this encounter: _1  (1.93 m).   Weight as of this encounter: 214 lb (97.1 kg).  BMI interpretation table: BMI level Category Range association with higher incidence of chronic pain  <18 kg/m2 Underweight   18.5-24.9 kg/m2 Ideal body weight   25-29.9 kg/m2 Overweight Increased incidence by 20%  30-34.9 kg/m2 Obese (Class I) Increased incidence by 68%  35-39.9 kg/m2 Severe obesity (Class II) Increased incidence by 136%  >40 kg/m2 Extreme obesity (Class III) Increased incidence by 254%   BMI Readings from Last 4 Encounters:  01/20/17 26.05 kg/m  01/02/17 26.17 kg/m  12/26/16 26.17 kg/m  12/23/16 26.17 kg/m   Wt Readings from Last 4 Encounters:  01/20/17 214 lb (97.1 kg)  01/02/17 215 lb (97.5 kg)  12/26/16 215 lb (97.5 kg)  12/23/16 215 lb (97.5 kg)  Psych/Mental status: Alert, oriented x 3 (person, place, & time)       Eyes: PERLA Respiratory: No evidence of acute respiratory distress  Cervical Spine Area Exam  Skin & Axial Inspection: No masses, redness, edema, swelling, or associated skin lesions Alignment: Symmetrical Functional ROM: Unrestricted ROM      Stability: No instability detected Muscle Tone/Strength: Functionally intact. No obvious neuro-muscular anomalies detected. Sensory (Neurological): Unimpaired Palpation: No palpable anomalies              Upper Extremity (UE) Exam    Side: Right upper extremity  Side: Left upper extremity  Skin & Extremity Inspection: Skin  color, temperature, and hair growth are WNL. No peripheral edema or cyanosis. No masses, redness, swelling, asymmetry, or associated skin lesions. No contractures.  Skin & Extremity Inspection: Skin color, temperature,  and hair growth are WNL. No peripheral edema or cyanosis. No masses, redness, swelling, asymmetry, or associated skin lesions. No contractures.  Functional ROM: Unrestricted ROM          Functional ROM: Unrestricted ROM          Muscle Tone/Strength: Functionally intact. No obvious neuro-muscular anomalies detected.  Muscle Tone/Strength: Functionally intact. No obvious neuro-muscular anomalies detected.  Sensory (Neurological): Unimpaired          Sensory (Neurological): Unimpaired          Palpation: No palpable anomalies              Palpation: No palpable anomalies              Specialized Test(s): Deferred         Specialized Test(s): Deferred          Thoracic Spine Area Exam  Skin & Axial Inspection: No masses, redness, or swelling Alignment: Symmetrical Functional ROM: Unrestricted ROM Stability: No instability detected Muscle Tone/Strength: Functionally intact. No obvious neuro-muscular anomalies detected. Sensory (Neurological): Unimpaired Muscle strength & Tone: No palpable anomalies  Lumbar Spine Area Exam  Skin & Axial Inspection: No masses, redness, or swelling Alignment: Symmetrical Functional ROM: Minimal ROM      Stability: No instability detected Muscle Tone/Strength: Functionally intact. No obvious neuro-muscular anomalies detected. Sensory (Neurological): Movement-associated pain Palpation: Complains of area being tender to palpation       Provocative Tests: Lumbar Hyperextension and rotation test: Positive bilaterally for facet joint pain. (R>L) Lumbar Lateral bending test: evaluation deferred today       Patrick's Maneuver: Positive for bilateral S-I arthralgia (R>L) and for right hip arthralgia.  Gait & Posture Assessment  Ambulation: Patient  ambulates using a cane Gait: Limited. Using assistive device to ambulate Posture: Difficulty standing up straight, due to pain   Lower Extremity Exam    Side: Right lower extremity  Side: Left lower extremity  Skin & Extremity Inspection: Skin color, temperature, and hair growth are WNL. No peripheral edema or cyanosis. No masses, redness, swelling, asymmetry, or associated skin lesions. No contractures.  Skin & Extremity Inspection: Skin color, temperature, and hair growth are WNL. No peripheral edema or cyanosis. No masses, redness, swelling, asymmetry, or associated skin lesions. No contractures.  Functional ROM: Unrestricted ROM          Functional ROM: Guarding for hip joint  Muscle Tone/Strength: Functionally intact. No obvious neuro-muscular anomalies detected.  Muscle Tone/Strength: Functionally intact. No obvious neuro-muscular anomalies detected.  Sensory (Neurological): Arthropathic arthralgia  Sensory (Neurological): The pain seemed to be referred towards the lower back when doing the figure 4 maneuver.  Palpation: No palpable anomalies  Palpation: No palpable anomalies   Assessment  Primary Diagnosis & Pertinent Problem List: The primary encounter diagnosis was Chronic low back pain (Secondary source of pain) (Bilateral) (R>L). Diagnoses of Lumbar facet syndrome (Bilateral) (R>L), Chronic sacroiliac joint pain (Bilateral) (R>L), Lower extremity neuropathy (Primary Source of Pain) (Bilateral) (R>L), and Chronic pain syndrome were also pertinent to this visit.  Status Diagnosis  Persistent Persistent Persistent 1. Chronic low back pain (Secondary source of pain) (Bilateral) (R>L)   2. Lumbar facet syndrome (Bilateral) (R>L)   3. Chronic sacroiliac joint pain (Bilateral) (R>L)   4. Lower extremity neuropathy (Primary Source of Pain) (Bilateral) (R>L)   5. Chronic pain syndrome     Problems updated and reviewed during this visit: Problem  Lumbar facet syndrome (Bilateral) (R>L)   Chronic sacroiliac joint pain (  Bilateral) (R>L)   Plan of Care  Pharmacotherapy (Medications Ordered): Meds ordered this encounter  Medications  . oxyCODONE (OXY IR/ROXICODONE) 5 MG immediate release tablet    Sig: Take 1 tablet (5 mg total) every 6 (six) hours as needed by mouth for severe pain.    Dispense:  120 tablet    Refill:  0    Do not place this medication, or any other prescription from our practice, on "Automatic Refill". Patient may have prescription filled one day early if pharmacy is closed on scheduled refill date. Do not fill until: 01/20/17 To last until: 02/19/17  This SmartLink is deprecated. Use AVSMEDLIST instead to display the medication list for a patient. Medications administered today: [name] had no medications administered during this visit.   Procedure Orders     LUMBAR FACET(MEDIAL BRANCH NERVE BLOCK) MBNB     SACROILIAC JOINT INJECTION Lab Orders  No laboratory test(s) ordered today   Imaging Orders  No imaging studies ordered today   Referral Orders  No referral(s) requested today    Interventional management options: Planned, scheduled, and/or pending:   Diagnostic bilateral lumbar facet + sacroiliac joint block under fluoroscopic guidance and IV sedation   Considering:   Lower extremity EMG/PNCV. (Radiculopathy versus peripheral neuropathy) Upper extremity EMG/PNCV.  Possible right-sided L3 + L4 Lumbar sympathetic RFA  Possible Racz procedure Diagnostic cervical epidural steroidinjection  Diagnostic bilateral cervical facet block  Possible bilateral cervical facet RFA Diagnostic bilateral lumbar facet block Possible bilateral lumbar facet RFA Diagnostic right intra-articular shoulder joint injection Diagnostic right suprascapular nerveblock Possibleright suprascapular RFA Diagnostic bilateral intra-articular knee joint injection Possible bilateral series of 5 Hyalgan knee injections Diagnostic bilateral Genicular  nerve block Possible bilateral Genicular RFA Diagnostic bilateral greater occipital nerve block  Possible bilateral greater occipital nerve RFA  Diagnostic bilateral C2 + TON nerve block  Possible bilateral C2 + TON RFA    Palliative PRN treatment(s):   None at this time.   Provider-requested follow-up: Return for Procedure (w/ sedation): (B) L-FCT + (B) SI BLK.  Future Appointments  Date Time Provider [place]  01/28/2017 12:30 PM Milinda Pointer, MD [facility] None  02/18/2017  9:00 AM Arnoldo Morale, MD [facility] None  03/25/2017 11:00 AM Vevelyn Francois, NP [facility] None   Primary Care Physician: Boykin Nearing, MD Location: [facility] Note by: [name], MD Date: 01/20/2017; Time: 4:19 PM

## 2017-01-20 ENCOUNTER — Ambulatory Visit: Payer: Self-pay | Attending: Pain Medicine | Admitting: Pain Medicine

## 2017-01-20 ENCOUNTER — Other Ambulatory Visit: Payer: Self-pay

## 2017-01-20 ENCOUNTER — Encounter: Payer: Self-pay | Admitting: Pain Medicine

## 2017-01-20 VITALS — BP 143/88 | HR 68 | Temp 98.5°F | Resp 16 | Ht 76.0 in | Wt 214.0 lb

## 2017-01-20 DIAGNOSIS — J320 Chronic maxillary sinusitis: Secondary | ICD-10-CM | POA: Insufficient documentation

## 2017-01-20 DIAGNOSIS — E559 Vitamin D deficiency, unspecified: Secondary | ICD-10-CM | POA: Insufficient documentation

## 2017-01-20 DIAGNOSIS — G629 Polyneuropathy, unspecified: Secondary | ICD-10-CM | POA: Insufficient documentation

## 2017-01-20 DIAGNOSIS — G5793 Unspecified mononeuropathy of bilateral lower limbs: Secondary | ICD-10-CM | POA: Insufficient documentation

## 2017-01-20 DIAGNOSIS — M545 Low back pain: Secondary | ICD-10-CM | POA: Insufficient documentation

## 2017-01-20 DIAGNOSIS — F331 Major depressive disorder, recurrent, moderate: Secondary | ICD-10-CM | POA: Insufficient documentation

## 2017-01-20 DIAGNOSIS — I426 Alcoholic cardiomyopathy: Secondary | ICD-10-CM | POA: Insufficient documentation

## 2017-01-20 DIAGNOSIS — M5442 Lumbago with sciatica, left side: Secondary | ICD-10-CM

## 2017-01-20 DIAGNOSIS — M47816 Spondylosis without myelopathy or radiculopathy, lumbar region: Secondary | ICD-10-CM

## 2017-01-20 DIAGNOSIS — M25562 Pain in left knee: Secondary | ICD-10-CM | POA: Insufficient documentation

## 2017-01-20 DIAGNOSIS — Q761 Klippel-Feil syndrome: Secondary | ICD-10-CM | POA: Insufficient documentation

## 2017-01-20 DIAGNOSIS — M79604 Pain in right leg: Secondary | ICD-10-CM | POA: Insufficient documentation

## 2017-01-20 DIAGNOSIS — I472 Ventricular tachycardia: Secondary | ICD-10-CM | POA: Insufficient documentation

## 2017-01-20 DIAGNOSIS — M533 Sacrococcygeal disorders, not elsewhere classified: Secondary | ICD-10-CM | POA: Insufficient documentation

## 2017-01-20 DIAGNOSIS — M961 Postlaminectomy syndrome, not elsewhere classified: Secondary | ICD-10-CM | POA: Insufficient documentation

## 2017-01-20 DIAGNOSIS — Z79899 Other long term (current) drug therapy: Secondary | ICD-10-CM | POA: Insufficient documentation

## 2017-01-20 DIAGNOSIS — Z79891 Long term (current) use of opiate analgesic: Secondary | ICD-10-CM | POA: Insufficient documentation

## 2017-01-20 DIAGNOSIS — M79605 Pain in left leg: Secondary | ICD-10-CM | POA: Insufficient documentation

## 2017-01-20 DIAGNOSIS — M542 Cervicalgia: Secondary | ICD-10-CM | POA: Insufficient documentation

## 2017-01-20 DIAGNOSIS — F411 Generalized anxiety disorder: Secondary | ICD-10-CM | POA: Insufficient documentation

## 2017-01-20 DIAGNOSIS — M25511 Pain in right shoulder: Secondary | ICD-10-CM | POA: Insufficient documentation

## 2017-01-20 DIAGNOSIS — K219 Gastro-esophageal reflux disease without esophagitis: Secondary | ICD-10-CM | POA: Insufficient documentation

## 2017-01-20 DIAGNOSIS — J449 Chronic obstructive pulmonary disease, unspecified: Secondary | ICD-10-CM | POA: Insufficient documentation

## 2017-01-20 DIAGNOSIS — G8918 Other acute postprocedural pain: Secondary | ICD-10-CM | POA: Insufficient documentation

## 2017-01-20 DIAGNOSIS — I10 Essential (primary) hypertension: Secondary | ICD-10-CM | POA: Insufficient documentation

## 2017-01-20 DIAGNOSIS — M25561 Pain in right knee: Secondary | ICD-10-CM | POA: Insufficient documentation

## 2017-01-20 DIAGNOSIS — G8929 Other chronic pain: Secondary | ICD-10-CM

## 2017-01-20 DIAGNOSIS — M5441 Lumbago with sciatica, right side: Secondary | ICD-10-CM

## 2017-01-20 DIAGNOSIS — N529 Male erectile dysfunction, unspecified: Secondary | ICD-10-CM | POA: Insufficient documentation

## 2017-01-20 DIAGNOSIS — F17210 Nicotine dependence, cigarettes, uncomplicated: Secondary | ICD-10-CM | POA: Insufficient documentation

## 2017-01-20 DIAGNOSIS — K589 Irritable bowel syndrome without diarrhea: Secondary | ICD-10-CM | POA: Insufficient documentation

## 2017-01-20 DIAGNOSIS — G894 Chronic pain syndrome: Secondary | ICD-10-CM | POA: Insufficient documentation

## 2017-01-20 DIAGNOSIS — N400 Enlarged prostate without lower urinary tract symptoms: Secondary | ICD-10-CM | POA: Insufficient documentation

## 2017-01-20 DIAGNOSIS — M7918 Myalgia, other site: Secondary | ICD-10-CM | POA: Insufficient documentation

## 2017-01-20 DIAGNOSIS — E785 Hyperlipidemia, unspecified: Secondary | ICD-10-CM | POA: Insufficient documentation

## 2017-01-20 MED ORDER — OXYCODONE HCL 5 MG PO TABS: 5.0000 mg | ORAL_TABLET | Freq: Four times a day (QID) | ORAL | 0 refills | Status: DC | PRN

## 2017-01-20 MED FILL — oxyCODONE HCL 5 MG TABS: 5 | 30 days supply | Qty: 120 | Fill #0

## 2017-01-20 NOTE — Patient Instructions (Addendum)
____________________________________________________________________________________________  Preparing for Procedure with Sedation Instructions: . Oral Intake: Do not eat or drink anything for at least 8 hours prior to your procedure. . Transportation: Public transportation is not allowed. Bring an adult driver. The driver must be physically present in our waiting room before any procedure can be started. Marland Kitchen Physical Assistance: Bring an adult physically capable of assisting you, in the event you need help. This adult should keep you company at home for at least 6 hours after the procedure. . Blood Pressure Medicine: Take your blood pressure medicine with a sip of water the morning of the procedure. . Blood thinners:  . Diabetics on insulin: Notify the staff so that you can be scheduled 1st case in the morning. If your diabetes requires high dose insulin, take only  of your normal insulin dose the morning of the procedure and notify the staff that you have done so. . Preventing infections: Shower with an antibacterial soap the morning of your procedure. . Build-up your immune system: Take 1000 mg of Vitamin C with every meal (3 times a day) the day prior to your procedure. Marland Kitchen Antibiotics: Inform the staff if you have a condition or reason that requires you to take antibiotics before dental procedures. . Pregnancy: If you are pregnant, call and cancel the procedure. . Sickness: If you have a cold, fever, or any active infections, call and cancel the procedure. . Arrival: You must be in the facility at least 30 minutes prior to your scheduled procedure. . Children: Do not bring children with you. . Dress appropriately: Bring dark clothing that you would not mind if they get stained. . Valuables: Do not bring any jewelry or valuables. Procedure appointments are reserved for interventional treatments only. Marland Kitchen No Prescription Refills. . No medication changes will be discussed during procedure  appointments. . No disability issues will be discussed. ____________________________________________________________________________________________  ____________________________________________________________________________________________  Pain Scale  Introduction: The pain score used by this practice is the Verbal Numerical Rating Scale (VNRS-11). This is an 11-point scale. It is for adults and children 10 years or older. There are significant differences in how the pain score is reported, used, and applied. Forget everything you learned in the past and learn this scoring system.  General Information: The scale should reflect your current level of pain. Unless you are specifically asked for the level of your worst pain, or your average pain. If you are asked for one of these two, then it should be understood that it is over the past 24 hours.  Basic Activities of Daily Living (ADL): Personal hygiene, dressing, eating, transferring, and using restroom.  Instructions: Most patients tend to report their level of pain as a combination of two factors, their physical pain and their psychosocial pain. This last one is also known as "suffering" and it is reflection of how physical pain affects you socially and psychologically. From now on, report them separately. From this point on, when asked to report your pain level, report only your physical pain. Use the following table for reference.  Pain Clinic Pain Levels (0-5/10)  Pain Level Score  Description  No Pain 0   Mild pain 1 Nagging, annoying, but does not interfere with basic activities of daily living (ADL). Patients are able to eat, bathe, get dressed, toileting (being able to get on and off the toilet and perform personal hygiene functions), transfer (move in and out of bed or a chair without assistance), and maintain continence (able to control bladder and bowel  functions). Blood pressure and heart rate are unaffected. A normal heart rate for  a healthy adult ranges from 60 to 100 bpm (beats per minute).   Mild to moderate pain 2 Noticeable and distracting. Impossible to hide from other people. More frequent flare-ups. Still possible to adapt and function close to normal. It can be very annoying and may have occasional stronger flare-ups. With discipline, patients may get used to it and adapt.   Moderate pain 3 Interferes significantly with activities of daily living (ADL). It becomes difficult to feed, bathe, get dressed, get on and off the toilet or to perform personal hygiene functions. Difficult to get in and out of bed or a chair without assistance. Very distracting. With effort, it can be ignored when deeply involved in activities.   Moderately severe pain 4 Impossible to ignore for more than a few minutes. With effort, patients may still be able to manage work or participate in some social activities. Very difficult to concentrate. Signs of autonomic nervous system discharge are evident: dilated pupils (mydriasis); mild sweating (diaphoresis); sleep interference. Heart rate becomes elevated (>115 bpm). Diastolic blood pressure (lower number) rises above 100 mmHg. Patients find relief in laying down and not moving.   Severe pain 5 Intense and extremely unpleasant. Associated with frowning face and frequent crying. Pain overwhelms the senses.  Ability to do any activity or maintain social relationships becomes significantly limited. Conversation becomes difficult. Pacing back and forth is common, as getting into a comfortable position is nearly impossible. Pain wakes you up from deep sleep. Physical signs will be obvious: pupillary dilation; increased sweating; goosebumps; brisk reflexes; cold, clammy hands and feet; nausea, vomiting or dry heaves; loss of appetite; significant sleep disturbance with inability to fall asleep or to remain asleep. When persistent, significant weight loss is observed due to the complete loss of appetite and  sleep deprivation.  Blood pressure and heart rate becomes significantly elevated. Caution: If elevated blood pressure triggers a pounding headache associated with blurred vision, then the patient should immediately seek attention at an urgent or emergency care unit, as these may be signs of an impending stroke.    Emergency Department Pain Levels (6-10/10)  Emergency Room Pain 6 Severely limiting. Requires emergency care and should not be seen or managed at an outpatient pain management facility. Communication becomes difficult and requires great effort. Assistance to reach the emergency department may be required. Facial flushing and profuse sweating along with potentially dangerous increases in heart rate and blood pressure will be evident.   Distressing pain 7 Self-care is very difficult. Assistance is required to transport, or use restroom. Assistance to reach the emergency department will be required. Tasks requiring coordination, such as bathing and getting dressed become very difficult.   Disabling pain 8 Self-care is no longer possible. At this level, pain is disabling. The individual is unable to do even the most "basic" activities such as walking, eating, bathing, dressing, transferring to a bed, or toileting. Fine motor skills are lost. It is difficult to think clearly.   Incapacitating pain 9 Pain becomes incapacitating. Thought processing is no longer possible. Difficult to remember your own name. Control of movement and coordination are lost.   The worst pain imaginable 10 At this level, most patients pass out from pain. When this level is reached, collapse of the autonomic nervous system occurs, leading to a sudden drop in blood pressure and heart rate. This in turn results in a temporary and dramatic drop in blood flow  to the brain, leading to a loss of consciousness. Fainting is one of the body's self defense mechanisms. Passing out puts the brain in a calmed state and causes it to shut  down for a while, in order to begin the healing process.    Summary: 1. Refer to this scale when providing [place] with your pain level. 2. Be accurate and careful when reporting your pain level. This will help with your care. 3. Over-reporting your pain level will lead to loss of credibility. 4. Even a level of 1/10 means that there is pain and will be treated at our facility. 5. High, inaccurate reporting will be documented as "Symptom Exaggeration", leading to loss of credibility and suspicions of possible secondary gains such as obtaining more narcotics, or wanting to appear disabled, for fraudulent reasons. 6. Only pain levels of 5 or below will be seen at our facility. 7. Pain levels of 6 and above will be sent to the Emergency Department and the appointment cancelled.  You were given one prescription for Oxycodone today. ______________________________________________________________________________________

## 2017-01-20 NOTE — Progress Notes (Signed)
Safety precautions to be maintained throughout the outpatient stay will include: orient to surroundings, keep bed in low position, maintain call bell within reach at all times, provide assistance with transfer out of bed and ambulation.  

## 2017-01-28 ENCOUNTER — Ambulatory Visit: Payer: Self-pay | Admitting: Pain Medicine

## 2017-02-03 MED FILL — traZODone HCL 100 MG TABS: 100 | 30 days supply | Qty: 30 | Fill #2

## 2017-02-03 MED FILL — GABAPENTIN 800 MG TABLET: 800 | 30 days supply | Qty: 120 | Fill #2

## 2017-02-03 MED FILL — ?METOPROLOL TARTRATE 50MG T: 50 | 30 days supply | Qty: 90 | Fill #0

## 2017-02-03 MED FILL — LOSARTAN POTASSIUM 50 MG TA: 50 | 30 days supply | Qty: 30 | Fill #1

## 2017-02-11 ENCOUNTER — Other Ambulatory Visit: Payer: Self-pay

## 2017-02-11 ENCOUNTER — Encounter: Payer: Self-pay | Admitting: Pain Medicine

## 2017-02-11 ENCOUNTER — Ambulatory Visit (HOSPITAL_BASED_OUTPATIENT_CLINIC_OR_DEPARTMENT_OTHER): Payer: Self-pay | Admitting: Pain Medicine

## 2017-02-11 ENCOUNTER — Ambulatory Visit
Admission: RE | Admit: 2017-02-11 | Discharge: 2017-02-11 | Disposition: A | Discharge status: Home / Self Care | Payer: Self-pay | Source: Ambulatory Visit | Attending: Pain Medicine | Admitting: Pain Medicine

## 2017-02-11 VITALS — BP 144/76 | HR 77 | Temp 98.0°F | Resp 16 | Ht 76.0 in | Wt 215.0 lb

## 2017-02-11 DIAGNOSIS — Z79899 Other long term (current) drug therapy: Secondary | ICD-10-CM | POA: Insufficient documentation

## 2017-02-11 DIAGNOSIS — Z79891 Long term (current) use of opiate analgesic: Secondary | ICD-10-CM | POA: Insufficient documentation

## 2017-02-11 DIAGNOSIS — M533 Sacrococcygeal disorders, not elsewhere classified: Secondary | ICD-10-CM | POA: Insufficient documentation

## 2017-02-11 DIAGNOSIS — M47816 Spondylosis without myelopathy or radiculopathy, lumbar region: Secondary | ICD-10-CM

## 2017-02-11 DIAGNOSIS — M5442 Lumbago with sciatica, left side: Secondary | ICD-10-CM

## 2017-02-11 DIAGNOSIS — M545 Low back pain: Secondary | ICD-10-CM | POA: Insufficient documentation

## 2017-02-11 DIAGNOSIS — G894 Chronic pain syndrome: Secondary | ICD-10-CM | POA: Insufficient documentation

## 2017-02-11 DIAGNOSIS — T402X5A Adverse effect of other opioids, initial encounter: Secondary | ICD-10-CM

## 2017-02-11 DIAGNOSIS — M5441 Lumbago with sciatica, right side: Secondary | ICD-10-CM | POA: Insufficient documentation

## 2017-02-11 DIAGNOSIS — R208 Other disturbances of skin sensation: Secondary | ICD-10-CM | POA: Insufficient documentation

## 2017-02-11 DIAGNOSIS — G8929 Other chronic pain: Secondary | ICD-10-CM

## 2017-02-11 MED ORDER — ROPIVACAINE HCL 2 MG/ML IJ SOLN
9.0000 mL | Freq: Once | INTRAMUSCULAR | Status: AC
  Administered 2017-02-11: 10 mL via PERINEURAL
  Filled 2017-02-11: qty 10

## 2017-02-11 MED ORDER — FENTANYL CITRATE (PF) 100 MCG/2ML IJ SOLN
25.0000 ug | INTRAMUSCULAR | Status: DC | PRN
  Administered 2017-02-11: 100 ug via INTRAVENOUS
  Filled 2017-02-11: qty 2

## 2017-02-11 MED ORDER — TRIAMCINOLONE ACETONIDE 40 MG/ML IJ SUSP
40.0000 mg | Freq: Once | INTRAMUSCULAR | Status: AC
  Administered 2017-02-11: 40 mg
  Filled 2017-02-11: qty 1

## 2017-02-11 MED ORDER — LIDOCAINE HCL 2 % IJ SOLN
10.0000 mL | Freq: Once | INTRAMUSCULAR | Status: AC
  Administered 2017-02-11: 400 mg
  Filled 2017-02-11: qty 20

## 2017-02-11 MED ORDER — MIDAZOLAM HCL 5 MG/5ML IJ SOLN
1.0000 mg | INTRAMUSCULAR | Status: DC | PRN
  Administered 2017-02-11: 5 mg via INTRAVENOUS
  Filled 2017-02-11: qty 5

## 2017-02-11 MED ORDER — METHYLPREDNISOLONE ACETATE 80 MG/ML IJ SUSP
80.0000 mg | Freq: Once | INTRAMUSCULAR | Status: AC
  Administered 2017-02-11: 80 mg via INTRA_ARTICULAR
  Filled 2017-02-11: qty 1

## 2017-02-11 MED ORDER — LACTATED RINGERS IV SOLN
1000.0000 mL | Freq: Once | INTRAVENOUS | Status: AC
  Administered 2017-02-11: 1000 mL via INTRAVENOUS

## 2017-02-11 MED ORDER — TRAMADOL HCL 50 MG PO TABS: 100.0000 mg | ORAL_TABLET | Freq: Four times a day (QID) | ORAL | 2 refills | Status: DC

## 2017-02-11 MED ORDER — ROPIVACAINE HCL 2 MG/ML IJ SOLN
9.0000 mL | Freq: Once | INTRAMUSCULAR | Status: AC
  Administered 2017-02-11: 10 mL via INTRA_ARTICULAR
  Filled 2017-02-11: qty 10

## 2017-02-11 MED FILL — traMADol HCL 50 MG TABS: 50 | 30 days supply | Qty: 240 | Fill #0

## 2017-02-11 NOTE — Progress Notes (Signed)
Nursing Pain Medication Assessment:  Safety precautions to be maintained throughout the outpatient stay will include: orient to surroundings, keep bed in low position, maintain call bell within reach at all times, provide assistance with transfer out of bed and ambulation.  Medication Inspection Compliance: Pill count conducted under aseptic conditions, in front of the patient. Neither the pills nor the bottle was removed from the patient's sight at any time. Once count was completed pills were immediately returned to the patient in their original bottle.  Medication: Oxycodone ER (OxyContin) Pill/Patch Count: 0 of 120 pills remain Pill/Patch Appearance: Markings consistent with prescribed medication Bottle Appearance: Standard pharmacy container. Clearly labeled. Filled Date: 3 / 12 / 2018 Last Medication intake:  Saturday

## 2017-02-11 NOTE — Progress Notes (Signed)
Patient's Name: Mark [name]  MRN: [id number]  Referring Provider: Boykin Nearing, MD  DOB: 1965/09/13  PCP: Boykin Nearing, MD  DOS: 02/11/2017  Note by: [name], MD  Service setting: Ambulatory outpatient  Specialty: Interventional Pain Management  Patient type: Established  Location: [facility] (AMB) Pain Management Facility  Visit type: Interventional Procedure   Primary Reason for Visit: Interventional Pain Management Treatment. CC: Back Pain (lower) and Fibromyalgia (entire body)  Procedure:  Anesthesia, Analgesia, Anxiolysis:  Procedure #1: Type: Diagnostic Medial Branch Facet Block Region: Lumbar Level: L2, L3, L4, L5, & S1 Medial Branch Level(s) Laterality: Bilateral  Procedure #2: Type: Diagnostic Sacroiliac Joint Block Region: Posterior Lumbosacral Level: PSIS (Posterior Superior Iliac Spine) Sacroiliac Joint Laterality: Bilateral  Type: Local Anesthesia with Moderate (Conscious) Sedation Local Anesthetic: Lidocaine 1% Route: Intravenous (IV) IV Access: Secured Sedation: Meaningful verbal contact was maintained at all times during the procedure  Indication(s): Analgesia and Anxiety   Indications: 1. Chronic low back pain (Secondary source of pain) (Bilateral) (R>L)   2. Lumbar facet syndrome (Bilateral) (R>L)   3. Chronic sacroiliac joint pain (Bilateral) (R>L)   4. Chronic pain syndrome   5. Opioid-induced hyperalgesia (with oxycodone)   6. Chronic bilateral low back pain with bilateral sciatica   7. Lumbar facet joint syndrome   8. Chronic sacroiliac joint pain    Pain Score: Pre-procedure: 8 /10 Post-procedure: 3 /10  Pre-op Assessment:  Mark [name] is a 51 y.o. (year old), male patient, seen today for interventional treatment. He  has a past surgical history that includes Back surgery (1995, 1999); Neck surgery (2000); Nasal sinus surgery; left heart catheterization with coronary angiogram (N/A, 01/13/2014); Subacromial decompression (Right,  11/16/2014); Nissen fundoplication; 24 hour ph study (N/A, 01/16/2015); Upper gastrointestinal endoscopy; and Colonoscopy (1998). Mark [name] has a current medication list which includes the following prescription(s): cyclobenzaprine, gabapentin, losartan, metoprolol tartrate, ventolin hfa, and tramadol, and the following Facility-Administered Medications: fentanyl and midazolam. His primarily concern today is the Back Pain (lower) and Fibromyalgia (entire body)  The patient received oxycodone for the acute postprocedure pain after the lumbar sympathetic radiofrequency.  Unfortunately very quickly this made things worse.  We now believe this to have been secondary to opioid-induced hyperalgesia secondary to the use of oxycodone.  While he was doing well on the tramadol, and oxycodone he feels he needs more medicine and the more he takes the more pain he gets.  He actually took all of his medication, when he should have lasted until 02/19/2017.  At this time, we will discontinue the oxycodone and we will not use it again.  He seems to have difficulty being compliant with his medications on the oxycodone.  Today he comes in for a diagnostic bilateral lumbar facet block + diagnostic bilateral sacroiliac joint block under fluoroscopic guidance and IV sedation.  Clearly he has a lot of tolerance to sedation as we gave him 5 mg of Versed + it did not seem to touch him.  In fact, he was very sensitive to the administration of the local anesthetic infiltration of the skin.  He was thrashing around and not cooperating.  This could be a form of hyperalgesia to those opioids.  Initial Vital Signs: There were no vitals taken for this visit. BMI: Estimated body mass index is 26.17 kg/m as calculated from the following:   Height as of this encounter: 6\' 4"  (1.93 m).   Weight as of this encounter: 215 lb (97.5 kg).  Risk Assessment: Allergies: Reviewed.  He is allergic to cozaar [losartan] and lisinopril.  Allergy  Precautions: None required Coagulopathies: Reviewed. None identified.  Blood-thinner therapy: None at this time Active Infection(s): Reviewed. None identified. Mark [name] is afebrile  Site Confirmation: Mark [name] was asked to confirm the procedure and laterality before marking the site Procedure checklist: Completed Consent: Before the procedure and under the influence of no sedative(s), amnesic(s), or anxiolytics, the patient was informed of the treatment options, risks and possible complications. To fulfill our ethical and legal obligations, as recommended by the American Medical Association's Code of Ethics, I have informed the patient of my clinical impression; the nature and purpose of the treatment or procedure; the risks, benefits, and possible complications of the intervention; the alternatives, including doing nothing; the risk(s) and benefit(s) of the alternative treatment(s) or procedure(s); and the risk(s) and benefit(s) of doing nothing. The patient was provided information about the general risks and possible complications associated with the procedure. These may include, but are not limited to: failure to achieve desired goals, infection, bleeding, organ or nerve damage, allergic reactions, paralysis, and death. In addition, the patient was informed of those risks and complications associated to Spine-related procedures, such as failure to decrease pain; infection (i.e.: Meningitis, epidural or intraspinal abscess); bleeding (i.e.: epidural hematoma, subarachnoid hemorrhage, or any other type of intraspinal or peri-dural bleeding); organ or nerve damage (i.e.: Any type of peripheral nerve, nerve root, or spinal cord injury) with subsequent damage to sensory, motor, and/or autonomic systems, resulting in permanent pain, numbness, and/or weakness of one or several areas of the body; allergic reactions; (i.e.: anaphylactic reaction); and/or death. Furthermore, the patient was informed of  those risks and complications associated with the medications. These include, but are not limited to: allergic reactions (i.e.: anaphylactic or anaphylactoid reaction(s)); adrenal axis suppression; blood sugar elevation that in diabetics may result in ketoacidosis or comma; water retention that in patients with history of congestive heart failure may result in shortness of breath, pulmonary edema, and decompensation with resultant heart failure; weight gain; swelling or edema; medication-induced neural toxicity; particulate matter embolism and blood vessel occlusion with resultant organ, and/or nervous system infarction; and/or aseptic necrosis of one or more joints. Finally, the patient was informed that Medicine is not an exact science; therefore, there is also the possibility of unforeseen or unpredictable risks and/or possible complications that may result in a catastrophic outcome. The patient indicated having understood very clearly. We have given the patient no guarantees and we have made no promises. Enough time was given to the patient to ask questions, all of which were answered to the patient's satisfaction. Mr. Mcquain has indicated that he wanted to continue with the procedure. Attestation: I, the ordering provider, attest that I have discussed with the patient the benefits, risks, side-effects, alternatives, likelihood of achieving goals, and potential problems during recovery for the procedure that I have provided informed consent. Date: 02/11/2017; Time: 7:49 AM  Pre-Procedure Preparation:  Monitoring: As per clinic protocol. Respiration, ETCO2, SpO2, BP, heart rate and rhythm monitor placed and checked for adequate function Safety Precautions: Patient was assessed for positional comfort and pressure points before starting the procedure. Time-out: I initiated and conducted the "Time-out" before starting the procedure, as per protocol. The patient was asked to participate by confirming the  accuracy of the "Time Out" information. Verification of the correct person, site, and procedure were performed and confirmed by me, the nursing staff, and the patient. "Time-out" conducted as per Joint Commission's Universal Protocol (UP.01.01.01). "Time-out"  Date & Time: 02/11/2017; 1434 hrs.  Description of Procedure #1 Process:   Time-out: "Time-out" completed before starting procedure, as per protocol. Position: Prone Target Area: For Lumbar Facet blocks, the target is the groove formed by the junction of the transverse process and superior articular process. For the L5 dorsal ramus, the target is the notch between superior articular process and sacral ala. For the S1 dorsal ramus, the target is the superior and lateral edge of the posterior S1 Sacral foramen. Approach: Paramedial approach. Area Prepped: Entire Posterior Lumbosacral Region Prepping solution: ChloraPrep (2% chlorhexidine gluconate and 70% isopropyl alcohol) Safety Precautions: Aspiration looking for blood return was conducted prior to all injections. At no point did we inject any substances, as a needle was being advanced. No attempts were made at seeking any paresthesias. Safe injection practices and needle disposal techniques used. Medications properly checked for expiration dates. SDV (single dose vial) medications used.  Description of the Procedure: Protocol guidelines were followed. The patient was placed in position over the fluoroscopy table. The target area was identified and the area prepped in the usual manner. Skin desensitized using vapocoolant spray. Skin & deeper tissues infiltrated with local anesthetic. Appropriate amount of time allowed to pass for local anesthetics to take effect. The procedure needle was introduced through the skin, ipsilateral to the reported pain, and advanced to the target area. Employing the "Medial Branch Technique", the needles were advanced to the angle made by the superior and medial portion  of the transverse process, and the lateral and inferior portion of the superior articulating process of the targeted vertebral bodies. This area is known as "Burton's Eye" or the "Eye of the [place] Dog". A procedure needle was introduced through the skin, and this time advanced to the angle made by the superior and medial border of the sacral ala, and the lateral border of the S1 vertebral body. This last needle was later repositioned at the superior and lateral border of the posterior S1 foramen. Negative aspiration confirmed. Solution injected in intermittent fashion, asking for systemic symptoms every 0.5cc of injectate. The needles were then removed and the area cleansed, making sure to leave some of the prepping solution back to take advantage of its long term bactericidal properties. Start Time: 1434 hrs. Materials:  Needle(s) Type: Regular needle Gauge: 22G Length: 3.5-in Medication(s): We administered lactated ringers, midazolam, fentaNYL, lidocaine, triamcinolone acetonide, ropivacaine (PF) 2 mg/mL (0.2%), triamcinolone acetonide, ropivacaine (PF) 2 mg/mL (0.2%), methylPREDNISolone acetate, and ropivacaine (PF) 2 mg/mL (0.2%). Please see chart orders for dosing details.  Description of Procedure # 2 Process:   Position: Prone Target Area: For upper sacroiliac joint block(s), the target is the superior and posterior margin of the sacroiliac joint. Approach: Ipsilateral approach. Area Prepped: Entire Posterior Lumbosacral Region Prepping solution: ChloraPrep (2% chlorhexidine gluconate and 70% isopropyl alcohol) Safety Precautions: Aspiration looking for blood return was conducted prior to all injections. At no point did we inject any substances, as a needle was being advanced. No attempts were made at seeking any paresthesias. Safe injection practices and needle disposal techniques used. Medications properly checked for expiration dates. SDV (single dose vial) medications used. Description  of the Procedure: Protocol guidelines were followed. The patient was placed in position over the fluoroscopy table. The target area was identified and the area prepped in the usual manner. Skin desensitized using vapocoolant spray. Skin & deeper tissues infiltrated with local anesthetic. Appropriate amount of time allowed to pass for local anesthetics to take effect. The procedure  needle was advanced under fluoroscopic guidance into the sacroiliac joint until a firm endpoint was obtained. Proper needle placement secured. Negative aspiration confirmed. Solution injected in intermittent fashion, asking for systemic symptoms every 0.5cc of injectate. The needles were then removed and the area cleansed, making sure to leave some of the prepping solution back to take advantage of its long term bactericidal properties. Vitals:   02/11/17 1444 02/11/17 1448 02/11/17 1452 02/11/17 1508  BP: (!) 140/92 132/81 123/90 (!) 144/76  Pulse: 73 77 72 77  Resp: 10 14 12 16   Temp:      SpO2: 96% 96% 97% 99%  Weight:      Height:        End Time: 1451 hrs. Materials:  Needle(s) Type: Regular needle Gauge: 22G Length: 3.5-in Medication(s): We administered lactated ringers, midazolam, fentaNYL, lidocaine, triamcinolone acetonide, ropivacaine (PF) 2 mg/mL (0.2%), triamcinolone acetonide, ropivacaine (PF) 2 mg/mL (0.2%), methylPREDNISolone acetate, and ropivacaine (PF) 2 mg/mL (0.2%). Please see chart orders for dosing details.  Imaging Guidance (Spinal):  Type of Imaging Technique: Fluoroscopy Guidance (Spinal) Indication(s): Assistance in needle guidance and placement for procedures requiring needle placement in or near specific anatomical locations not easily accessible without such assistance. Exposure Time: Please see nurses notes. Contrast: None used. Fluoroscopic Guidance: I was personally present during the use of fluoroscopy. "Tunnel Vision Technique" used to obtain the best possible view of the target  area. Parallax error corrected before commencing the procedure. "Direction-depth-direction" technique used to introduce the needle under continuous pulsed fluoroscopy. Once target was reached, antero-posterior, oblique, and lateral fluoroscopic projection used confirm needle placement in all planes. Images permanently stored in EMR. Interpretation: No contrast injected. I personally interpreted the imaging intraoperatively. Adequate needle placement confirmed in multiple planes. Permanent images saved into the patient's record.  Antibiotic Prophylaxis:  Indication(s): None identified Antibiotic given: None  Post-operative Assessment:  EBL: None Complications: No immediate post-treatment complications observed by team, or reported by patient. Note: The patient tolerated the entire procedure well. A repeat set of vitals were taken after the procedure and the patient was kept under observation following institutional policy, for this type of procedure. Post-procedural neurological assessment was performed, showing return to baseline, prior to discharge. The patient was provided with post-procedure discharge instructions, including a section on how to identify potential problems. Should any problems arise concerning this procedure, the patient was given instructions to immediately contact us, at any time, without hesitation. In any case, we plan to contact the patient by telephone for a follow-up status report regarding this interventional procedure. Comments:  No additional relevant information.  Plan of Care    Imaging Orders     DG C-Arm 1-60 Min-No Report  Procedure Orders     LUMBAR FACET(MEDIAL BRANCH NERVE BLOCK) MBNB     SACROILIAC JOINT INJECTION  Medications ordered for procedure: Meds ordered this encounter  Medications  . lactated ringers infusion 1,000 mL  . midazolam (VERSED) 5 MG/5ML injection 1-2 mg    Make sure Flumazenil is available in the pyxis when using this medication.  If oversedation occurs, administer 0.2 mg IV over 15 sec. If after 45 sec no response, administer 0.2 mg again over 1 min; may repeat at 1 min intervals; not to exceed 4 doses (1 mg)  . fentaNYL (SUBLIMAZE) injection 25-50 mcg    Make sure Narcan is available in the pyxis when using this medication. In the event of respiratory depression (RR< 8/min): Titrate NARCAN (naloxone) in increments of 0.1 to  0.2 mg IV at 2-3 minute intervals, until desired degree of reversal.  . lidocaine (XYLOCAINE) 2 % (with pres) injection 200 mg  . triamcinolone acetonide (KENALOG-40) injection 40 mg  . ropivacaine (PF) 2 mg/mL (0.2%) (NAROPIN) injection 9 mL  . triamcinolone acetonide (KENALOG-40) injection 40 mg  . ropivacaine (PF) 2 mg/mL (0.2%) (NAROPIN) injection 9 mL  . methylPREDNISolone acetate (DEPO-MEDROL) injection 80 mg  . ropivacaine (PF) 2 mg/mL (0.2%) (NAROPIN) injection 9 mL  . traMADol (ULTRAM) 50 MG tablet    Sig: Take 2 tablets (100 mg total) by mouth 4 (four) times daily. 1-2 tablets    Dispense:  240 tablet    Refill:  2    Fill one day early if pharmacy is closed on scheduled refill date. Do not fill until: 12/23/2016 To last until: 03/23/2017   Medications administered: We administered lactated ringers, midazolam, fentaNYL, lidocaine, triamcinolone acetonide, ropivacaine (PF) 2 mg/mL (0.2%), triamcinolone acetonide, ropivacaine (PF) 2 mg/mL (0.2%), methylPREDNISolone acetate, and ropivacaine (PF) 2 mg/mL (0.2%).  See the medical record for exact dosing, route, and time of administration.  This SmartLink is deprecated. Use AVSMEDLIST instead to display the medication list for a patient. Disposition: Discharge home  Discharge Date & Time: 02/11/2017; 1525 hrs.   Physician-requested Follow-up: Return for post-procedure eval by Dr. Dossie Arbour in 2 wks. Future Appointments  Date Time Provider [place]  02/18/2017  9:00 AM Arnoldo Morale, MD [facility] None  02/24/2017  9:15 AM Milinda Pointer, MD [facility] None  03/25/2017 11:00 AM Vevelyn Francois, NP [facility] None   Primary Care Physician: Boykin Nearing, MD Location: [facility] Note by: [name], MD Date: 02/11/2017; Time: 3:55 PM  Disclaimer:  Medicine is not an Chief Strategy Officer. The only guarantee in medicine is that nothing is guaranteed. It is important to note that the decision to proceed with this intervention was based on the information collected from the patient. The Data and conclusions were drawn from the patient's questionnaire, the interview, and the physical examination. Because the information was provided in large part by the patient, it cannot be guaranteed that it has not been purposely or unconsciously manipulated. Every effort has been made to obtain as much relevant data as possible for this evaluation. It is important to note that the conclusions that lead to this procedure are derived in large part from the available data. Always take into account that the treatment will also be dependent on availability of resources and existing treatment guidelines, considered by other Pain Management Practitioners as being common knowledge and practice, at the time of the intervention. For Medico-Legal purposes, it is also important to point out that variation in procedural techniques and pharmacological choices are the acceptable norm. The indications, contraindications, technique, and results of the above procedure should only be interpreted and judged by a Board-Certified Interventional Pain Specialist with extensive familiarity and expertise in the same exact procedure and technique.

## 2017-02-11 NOTE — Patient Instructions (Addendum)
____________________________________________________________________________________________  Post-Procedure instructions Instructions:  Apply ice: Fill a plastic sandwich bag with crushed ice. Cover it with a small towel and apply to injection site. Apply for 15 minutes then remove x 15 minutes. Repeat sequence on day of procedure, until you go to bed. The purpose is to minimize swelling and discomfort after procedure.  Apply heat: Apply heat to procedure site starting the day following the procedure. The purpose is to treat any soreness and discomfort from the procedure.  Food intake: Start with clear liquids (like water) and advance to regular food, as tolerated.   Physical activities: Keep activities to a minimum for the first 8 hours after the procedure.   Driving: If you have received any sedation, you are not allowed to drive for 24 hours after your procedure.  Blood thinner: Restart your blood thinner 6 hours after your procedure. (Only for those taking blood thinners)  Insulin: As soon as you can eat, you may resume your normal dosing schedule. (Only for those taking insulin)  Infection prevention: Keep procedure site clean and dry.  Post-procedure Pain Diary: Extremely important that this be done correctly and accurately. Recorded information will be used to determine the next step in treatment.  Pain evaluated is that of treated area only. Do not include pain from an untreated area.  Complete every hour, on the hour, for the initial 8 hours. Set an alarm to help you do this part accurately.  Do not go to sleep and have it completed later. It will not be accurate.  Follow-up appointment: Keep your follow-up appointment after the procedure. Usually 2 weeks for most procedures. (6 weeks in the case of radiofrequency.) Bring you pain diary.  Expect:  From numbing medicine (AKA: Local Anesthetics): Numbness or decrease in pain.  Onset: Full effect within 15 minutes of  injected.  Duration: It will depend on the type of local anesthetic used. On the average, 1 to 8 hours.   From steroids: Decrease in swelling or inflammation. Once inflammation is improved, relief of the pain will follow.  Onset of benefits: Depends on the amount of swelling present. The more swelling, the longer it will take for the benefits to be seen. In some cases, up to 10 days.  Duration: Steroids will stay in the system x 2 weeks. Duration of benefits will depend on multiple posibilities including persistent irritating factors.  From procedure: Some discomfort is to be expected once the numbing medicine wears off. This should be minimal if ice and heat are applied as instructed. Call if:  You experience numbness and weakness that gets worse with time, as opposed to wearing off.  New onset bowel or bladder incontinence. (Spinal procedures only)  Emergency Numbers:  Durning business hours (Monday - Thursday, 8:00 AM - 4:00 PM) (Friday, 9:00 AM - 12:00 Noon): [phone]  After hours: [phone] ____________________________________________________________________________________________   ____________________________________________________________________________________________  Pain Scale  Introduction: The pain score used by this practice is the Verbal Numerical Rating Scale (VNRS-11). This is an 11-point scale. It is for adults and children 10 years or older. There are significant differences in how the pain score is reported, used, and applied. Forget everything you learned in the past and learn this scoring system.  General Information: The scale should reflect your current level of pain. Unless you are specifically asked for the level of your worst pain, or your average pain. If you are asked for one of these two, then it should be understood that it is  over the past 24 hours.  Basic Activities of Daily Living (ADL): Personal hygiene, dressing, eating,  transferring, and using restroom.  Instructions: Most patients tend to report their level of pain as a combination of two factors, their physical pain and their psychosocial pain. This last one is also known as "suffering" and it is reflection of how physical pain affects you socially and psychologically. From now on, report them separately. From this point on, when asked to report your pain level, report only your physical pain. Use the following table for reference.  Pain Clinic Pain Levels (0-5/10)  Pain Level Score  Description  No Pain 0   Mild pain 1 Nagging, annoying, but does not interfere with basic activities of daily living (ADL). Patients are able to eat, bathe, get dressed, toileting (being able to get on and off the toilet and perform personal hygiene functions), transfer (move in and out of bed or a chair without assistance), and maintain continence (able to control bladder and bowel functions). Blood pressure and heart rate are unaffected. A normal heart rate for a healthy adult ranges from 60 to 100 bpm (beats per minute).   Mild to moderate pain 2 Noticeable and distracting. Impossible to hide from other people. More frequent flare-ups. Still possible to adapt and function close to normal. It can be very annoying and may have occasional stronger flare-ups. With discipline, patients may get used to it and adapt.   Moderate pain 3 Interferes significantly with activities of daily living (ADL). It becomes difficult to feed, bathe, get dressed, get on and off the toilet or to perform personal hygiene functions. Difficult to get in and out of bed or a chair without assistance. Very distracting. With effort, it can be ignored when deeply involved in activities.   Moderately severe pain 4 Impossible to ignore for more than a few minutes. With effort, patients may still be able to manage work or participate in some social activities. Very difficult to concentrate. Signs of autonomic nervous  system discharge are evident: dilated pupils (mydriasis); mild sweating (diaphoresis); sleep interference. Heart rate becomes elevated (>115 bpm). Diastolic blood pressure (lower number) rises above 100 mmHg. Patients find relief in laying down and not moving.   Severe pain 5 Intense and extremely unpleasant. Associated with frowning face and frequent crying. Pain overwhelms the senses.  Ability to do any activity or maintain social relationships becomes significantly limited. Conversation becomes difficult. Pacing back and forth is common, as getting into a comfortable position is nearly impossible. Pain wakes you up from deep sleep. Physical signs will be obvious: pupillary dilation; increased sweating; goosebumps; brisk reflexes; cold, clammy hands and feet; nausea, vomiting or dry heaves; loss of appetite; significant sleep disturbance with inability to fall asleep or to remain asleep. When persistent, significant weight loss is observed due to the complete loss of appetite and sleep deprivation.  Blood pressure and heart rate becomes significantly elevated. Caution: If elevated blood pressure triggers a pounding headache associated with blurred vision, then the patient should immediately seek attention at an urgent or emergency care unit, as these may be signs of an impending stroke.    Emergency Department Pain Levels (6-10/10)  Emergency Room Pain 6 Severely limiting. Requires emergency care and should not be seen or managed at an outpatient pain management facility. Communication becomes difficult and requires great effort. Assistance to reach the emergency department may be required. Facial flushing and profuse sweating along with potentially dangerous increases in heart rate and blood  pressure will be evident.   Distressing pain 7 Self-care is very difficult. Assistance is required to transport, or use restroom. Assistance to reach the emergency department will be required. Tasks requiring  coordination, such as bathing and getting dressed become very difficult.   Disabling pain 8 Self-care is no longer possible. At this level, pain is disabling. The individual is unable to do even the most "basic" activities such as walking, eating, bathing, dressing, transferring to a bed, or toileting. Fine motor skills are lost. It is difficult to think clearly.   Incapacitating pain 9 Pain becomes incapacitating. Thought processing is no longer possible. Difficult to remember your own name. Control of movement and coordination are lost.   The worst pain imaginable 10 At this level, most patients pass out from pain. When this level is reached, collapse of the autonomic nervous system occurs, leading to a sudden drop in blood pressure and heart rate. This in turn results in a temporary and dramatic drop in blood flow to the brain, leading to a loss of consciousness. Fainting is one of the body's self defense mechanisms. Passing out puts the brain in a calmed state and causes it to shut down for a while, in order to begin the healing process.    Summary: 1. Refer to this scale when providing [place] with your pain level. 2. Be accurate and careful when reporting your pain level. This will help with your care. 3. Over-reporting your pain level will lead to loss of credibility. 4. Even a level of 1/10 means that there is pain and will be treated at our facility. 5. High, inaccurate reporting will be documented as "Symptom Exaggeration", leading to loss of credibility and suspicions of possible secondary gains such as obtaining more narcotics, or wanting to appear disabled, for fraudulent reasons. 6. Only pain levels of 5 or below will be seen at our facility. 7. Pain levels of 6 and above will be sent to the Emergency Department and the appointment cancelled. ____________________________________________________________________________________________

## 2017-02-18 ENCOUNTER — Ambulatory Visit: Payer: Self-pay | Admitting: Family Medicine

## 2017-02-24 ENCOUNTER — Ambulatory Visit: Payer: Self-pay | Admitting: Pain Medicine

## 2017-03-12 MED FILL — traMADol HCL 50 MG TABS: 50 | 30 days supply | Qty: 240 | Fill #1

## 2017-03-13 MED FILL — GABAPENTIN 800 MG TABLET: 800 | 30 days supply | Qty: 120 | Fill #3

## 2017-03-13 MED FILL — ?METOPROLOL TARTRATE 50MG T: 50 | 30 days supply | Qty: 90 | Fill #1

## 2017-03-13 MED FILL — LOSARTAN POTASSIUM 50 MG TA: 50 | 30 days supply | Qty: 30 | Fill #2

## 2017-03-18 MED FILL — CYCLOBENZAPRINE 10 MG TAB: 10 | 30 days supply | Qty: 30 | Fill #0

## 2017-03-18 MED FILL — traZODone HCL 100 MG TABS: 100 | 30 days supply | Qty: 30 | Fill #3

## 2017-03-25 ENCOUNTER — Ambulatory Visit: Payer: Self-pay | Attending: Nurse Practitioner | Admitting: Nurse Practitioner

## 2017-03-25 ENCOUNTER — Telehealth: Payer: Self-pay | Admitting: Nurse Practitioner

## 2017-03-25 ENCOUNTER — Encounter: Payer: Self-pay | Admitting: Nurse Practitioner

## 2017-03-25 ENCOUNTER — Other Ambulatory Visit: Payer: Self-pay

## 2017-03-25 VITALS — BP 139/91 | HR 68 | Temp 98.7°F | Resp 16 | Ht 76.0 in | Wt 215.0 lb

## 2017-03-25 DIAGNOSIS — M7918 Myalgia, other site: Secondary | ICD-10-CM

## 2017-03-25 DIAGNOSIS — Z79899 Other long term (current) drug therapy: Secondary | ICD-10-CM | POA: Insufficient documentation

## 2017-03-25 DIAGNOSIS — G894 Chronic pain syndrome: Secondary | ICD-10-CM

## 2017-03-25 DIAGNOSIS — J449 Chronic obstructive pulmonary disease, unspecified: Secondary | ICD-10-CM | POA: Insufficient documentation

## 2017-03-25 DIAGNOSIS — G629 Polyneuropathy, unspecified: Secondary | ICD-10-CM

## 2017-03-25 DIAGNOSIS — M5441 Lumbago with sciatica, right side: Secondary | ICD-10-CM

## 2017-03-25 DIAGNOSIS — K219 Gastro-esophageal reflux disease without esophagitis: Secondary | ICD-10-CM | POA: Insufficient documentation

## 2017-03-25 DIAGNOSIS — M47816 Spondylosis without myelopathy or radiculopathy, lumbar region: Secondary | ICD-10-CM

## 2017-03-25 DIAGNOSIS — G8929 Other chronic pain: Secondary | ICD-10-CM

## 2017-03-25 DIAGNOSIS — E785 Hyperlipidemia, unspecified: Secondary | ICD-10-CM | POA: Insufficient documentation

## 2017-03-25 DIAGNOSIS — M79605 Pain in left leg: Secondary | ICD-10-CM

## 2017-03-25 DIAGNOSIS — M5442 Lumbago with sciatica, left side: Secondary | ICD-10-CM

## 2017-03-25 DIAGNOSIS — Z5181 Encounter for therapeutic drug level monitoring: Secondary | ICD-10-CM | POA: Insufficient documentation

## 2017-03-25 DIAGNOSIS — M79604 Pain in right leg: Secondary | ICD-10-CM

## 2017-03-25 DIAGNOSIS — I10 Essential (primary) hypertension: Secondary | ICD-10-CM | POA: Insufficient documentation

## 2017-03-25 DIAGNOSIS — Z79891 Long term (current) use of opiate analgesic: Secondary | ICD-10-CM

## 2017-03-25 DIAGNOSIS — F17210 Nicotine dependence, cigarettes, uncomplicated: Secondary | ICD-10-CM | POA: Insufficient documentation

## 2017-03-25 DIAGNOSIS — K589 Irritable bowel syndrome without diarrhea: Secondary | ICD-10-CM | POA: Insufficient documentation

## 2017-03-25 DIAGNOSIS — J320 Chronic maxillary sinusitis: Secondary | ICD-10-CM | POA: Insufficient documentation

## 2017-03-25 DIAGNOSIS — N400 Enlarged prostate without lower urinary tract symptoms: Secondary | ICD-10-CM | POA: Insufficient documentation

## 2017-03-25 MED ORDER — GABAPENTIN 800 MG PO TABS: 800.0000 mg | ORAL_TABLET | Freq: Four times a day (QID) | ORAL | 2 refills | Status: DC

## 2017-03-25 MED ORDER — TRAMADOL HCL 50 MG PO TABS: 100.0000 mg | ORAL_TABLET | Freq: Four times a day (QID) | ORAL | 2 refills | Status: DC

## 2017-03-25 MED ORDER — CYCLOBENZAPRINE HCL 10 MG PO TABS: 10.0000 mg | ORAL_TABLET | Freq: Every day | ORAL | 2 refills | Status: DC

## 2017-03-25 NOTE — Progress Notes (Addendum)
Patient's Name: Mark [name]  MRN: [id number]  Referring Provider: Boykin Nearing, MD  DOB: [birth year]  PCP: Boykin Nearing, MD  DOS: 03/25/2017  Note by: Vevelyn Francois NP  Service setting: Ambulatory outpatient  Specialty: Interventional Pain Management  Location: [facility] (AMB) Pain Management Facility    Patient type: Established    Primary Reason(s) for Visit: Encounter for prescription drug management & post-procedure evaluation of chronic illness with mild to moderate exacerbation(Level of risk: moderate) CC: Foot Burn (bilaterally, burning, constant pain) and Pain ("every joint in my body hurts")  HPI  Mark [name] is a 52 y.o. year old, male patient, who comes today for a post-procedure evaluation and medication management. 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 ([place]); Alcohol intoxication ([place]); Chronic low back pain (Secondary source of pain) (Bilateral) (R>L); Chronic pain syndrome; Paroxysmal VT ([place]); Opioid dependence ([place]); Long Q-T syndrome; Hypokalemia; Alcohol use disorder, severe, dependence ([place]); Chronic shoulder pain (Right); Chronic radicular low back pain [facility] source of pain) (Right) (to calf); Closed fracture of 5th metacarpal; Chronic fatigue; Vitamin D insufficiency; Poor dentition; Chronic maxillary sinusitis; Tender prostate; Erectile dysfunction; Chronic anxiety; GERD (gastroesophageal reflux disease); Suicidal ideation; Non-sustained ventricular tachycardia ([place]); GAD (generalized anxiety disorder); Alcohol use disorder, severe, in early remission ([place]); Long term current use of opiate analgesic; Long term prescription opiate use; History of fusion of cervical spine; Chronic neck pain (Bilateral)(R>L); Cervical fusion syndrome; Failed back surgical syndrome; Chronic knee pain  (Bilateral) (R>L); Major depressive disorder, recurrent episode, moderate ([place]);  Musculoskeletal pain; 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 His primarily concern today is the Foot Burn (bilaterally, burning, constant pain) and Pain ("every joint in my body hurts")  Pain Assessment: Location: Right, Left Foot Radiating: shoots up leg to hips, to all toes Onset: More than a month ago Duration: Chronic pain, Neuropathic pain Quality: Shooting, Stabbing, Constant, Sharp, Dull Severity: 8 /10 (self-reported pain score)  Note: Reported level is compatible with observation. Clinically the patient looks like a 2/10 A 2/10 is viewed as "Mild to Moderate" and described as noticeable and distracting. Impossible to hide from other people. More frequent flare-ups. Still possible to adapt and function close to normal. It can be very annoying and may have occasional stronger flare-ups. With discipline, patients may get used to it and adapt. Information on the proper use of the pain scale provided to the patient today. When using our objective Pain Scale, levels between 6 and 10/10 are said to belong in an emergency room, as it progressively worsens from a 6/10, described as severely limiting, requiring emergency care not usually available at an outpatient pain management facility. At a 6/10 level, communication becomes difficult and requires great effort. Assistance to reach the emergency department may be required. Facial flushing and profuse sweating along with potentially dangerous increases in heart rate and blood pressure will be  evident. Effect on ADL: pt states about all he can do is sit and watch tv all day - he is still in pain when he sits, but it takes the pressure off Timing: Constant Modifying factors: medications  Mark [name] was last seen on 01/02/2017 for a  procedure. During today's appointment we reviewed Mark [name] post-procedure results, as well as his outpatient medication regimen. He states that he is now having increased low back and leg aching since the procedure. He also admits that he is also having neck pain.  He admits that the Tramadol is not effective. He continues to use the Gabapentin as directed. He states that he only uses the flexeril at night prn. He states that he was on APAP #3 was effective for his pain. He states that he was to elevated to APAP #4.  He also admits that he his having urinary problems.He is only voiding small amount with lower abdominal pain. He admits that he has had been told in the past that his Prostate was enlarged. He has not had this evaluated in several years. He admits that he will follow up with new PCP in a week or so.   Further details on both, my assessment(s), as well as the proposed treatment plan, please see below.  Controlled Substance Pharmacotherapy Assessment REMS (Risk Evaluation and Mitigation Strategy)  Tramadol 100 mg 4 times a day (400 mg/dayof tramadol)  MME/day:'40mg'$ /day.   Mark [name]  03/25/2017 12:05 PM  Signed Nursing Pain Medication Assessment:  Safety precautions to be maintained throughout the outpatient stay will include: orient to surroundings, keep bed in low position, maintain call bell within reach at all times, provide assistance with transfer out of bed and ambulation.  Medication Inspection Compliance: Pill count conducted under aseptic conditions, in front of the patient. Neither the pills nor the bottle was removed from the patient's sight at any time. Once count was completed pills were immediately returned to the  patient in their original bottle.  Medication: Tramadol (Ultram) Pill/Patch Count: 35 of 240 pills remain Pill/Patch Appearance: Markings consistent with prescribed medication Bottle Appearance: Standard pharmacy container. Clearly labeled. Filled Date: 01 / 02 / 2019 Last Medication intake:  Today   Pharmacokinetics: Liberation and absorption (onset of action): WNL Distribution (time to peak effect): WNL Metabolism and excretion (duration of action): WNL         Pharmacodynamics: Desired effects: Analgesia: Mr. Dalton reports >50% benefit. Functional ability: Patient reports that medication allows him to accomplish basic ADLs Clinically meaningful improvement in function (CMIF): Sustained CMIF goals met Perceived effectiveness: Described as relatively effective, allowing for increase in activities of daily living (ADL) Undesirable effects: Side-effects or Adverse reactions: None reported Monitoring: [place] PMP: Online review of the past 64-monthperiod conducted. Compliant with practice rules and regulations Last UDS on record: Summary  Date Value Ref Range Status  09/16/2016 FINAL  Final    Comment:    ==================================================================== TOXASSURE SELECT 13 (MW) ==================================================================== Test                             Result       Flag       Units Drug Absent but Declared for Prescription Verification   Tramadol                       Not Detected UNEXPECTED ==================================================================== Test                      Result    Flag   Units      Ref Range   Creatinine              309  mg/dL      >=20 ==================================================================== Declared Medications:  The flagging and interpretation on this report are based on the  following declared medications.  Unexpected results may arise from  inaccuracies in the declared  medications.  **Note: The testing scope of this panel includes these medications:  Tramadol  **Note: The testing scope of this panel does not include following  reported medications:  Albuterol (Ventolin HFA)  Baclofen  Cyclobenzaprine  Gabapentin  Losartan (Cozaar)  Metoprolol ==================================================================== For clinical consultation, please call [phone]. ====================================================================    UDS interpretation: Non-Compliant The patient was given a final warning about the accuracy of reporting medications. Medication Assessment Form: Reviewed. Patient indicates being compliant with therapy Treatment compliance: Non-compliant. Steps taken to remind the patient of the seriousness of adequate therapy compliance Risk Assessment Profile: Aberrant behavior: See prior evaluations. None observed or detected today Comorbid factors increasing risk of overdose: See prior notes. No additional risks detected today Risk of substance use disorder (SUD): High-to-Very High Opioid Risk Tool - 03/25/17 1122      Family History of Substance Abuse   Alcohol  Positive Male    Illegal Drugs  Negative    Rx Drugs  Negative      Personal History of Substance Abuse   Alcohol  Negative    Illegal Drugs  Negative    Rx Drugs  Negative      Age   Age between 73-45 years   No      History of Preadolescent Sexual Abuse   History of Preadolescent Sexual Abuse  Negative or Male      Psychological Disease   Psychological Disease  Positive    Bipolar  Positive not taking medication for this   not taking medication for this   Depression  Positive      Total Score   Opioid Risk Tool Scoring  6    Opioid Risk Interpretation  Moderate Risk      ORT Scoring interpretation table:  Score <3 = Low Risk for SUD  Score between 4-7 = Moderate Risk for SUD  Score >8 = High Risk for Opioid Abuse   Risk Mitigation Strategies:   Patient Counseling: Covered Patient-Prescriber Agreement (PPA): Present and active  Notification to other healthcare providers: Done  Pharmacologic Plan: No change in therapy, at this time.             Post-Procedure Assessment  02/11/2017 Procedure:Diagnostic bilateral lumbar facet + sacroiliac joint block  Pre-procedure pain score:  8/10 Post-procedure pain score: 3/10         Influential Factors: BMI: 26.17 kg/m Intra-procedural challenges: None observed.         Assessment challenges: None detected.              Reported side-effects: None.        Post-procedural adverse reactions or complications: None reported         Sedation: Please see nurses note. When no sedatives are used, the analgesic levels obtained are directly associated to the effectiveness of the local anesthetics. However, when sedation is provided, the level of analgesia obtained during the initial 1 hour following the intervention, is believed to be the result of a combination of factors. These factors may include, but are not limited to: 1. The effectiveness of the local anesthetics used. 2. The effects of the analgesic(s) and/or anxiolytic(s) used. 3. The degree of discomfort experienced by the patient at the time of the procedure. 4. The patients ability and  reliability in recalling and recording the events. 5. The presence and influence of possible secondary gains and/or psychosocial factors. Reported result: Relief experienced during the 1st hour after the procedure: 70 % (Ultra-Short Term Relief)            Interpretative annotation: Clinically appropriate result. Analgesia during this period is likely to be Local Anesthetic and/or IV Sedative (Analgesic/Anxiolytic) related.          Effects of local anesthetic: The analgesic effects attained during this period are directly associated to the localized infiltration of local anesthetics and therefore cary significant diagnostic value as to the etiological  location, or anatomical origin, of the pain. Expected duration of relief is directly dependent on the pharmacodynamics of the local anesthetic used. Long-acting (4-6 hours) anesthetics used.  Reported result: Relief during the next 4 to 6 hour after the procedure: 70 % (Short-Term Relief)            Interpretative annotation: Clinically appropriate result. Analgesia during this period is likely to be Local Anesthetic-related.          Long-term benefit: Defined as the period of time past the expected duration of local anesthetics (1 hour for short-acting and 4-6 hours for long-acting). With the possible exception of prolonged sympathetic blockade from the local anesthetics, benefits during this period are typically attributed to, or associated with, other factors such as analgesic sensory neuropraxia, antiinflammatory effects, or beneficial biochemical changes provided by agents other than the local anesthetics.  Reported result: Extended relief following procedure: 20 % (Long-Term Relief)            Interpretative annotation: Clinically appropriate result. Good relief. No permanent benefit expected. Inflammation plays a part in the etiology to the pain.          Current benefits: Defined as reported results that persistent at this point in time.   Analgesia: <25 %            Function: No improvement ROM: No improvement Interpretative annotation: Recurrence of symptoms. Therapeutic failure.             Interpretation: Results would suggest failure of therapy in achieving desired goal(s).                  Plan:  Please see "Plan of Care" for details.        Laboratory Chemistry  Inflammation Markers (CRP: Acute Phase) (ESR: Chronic Phase) Lab Results  Component Value Date   CRP 0.9 03/18/2016   ESRSEDRATE 2 03/18/2016   LATICACIDVEN 1.16 02/06/2015                 Rheumatology Markers Lab Results  Component Value Date   LABURIC 5.7 12/09/2011                Renal Function  Markers Lab Results  Component Value Date   BUN 5 (L) 05/15/2016   CREATININE 0.72 05/15/2016   GFRAA >60 05/15/2016   GFRNONAA >60 05/15/2016                 Hepatic Function Markers Lab Results  Component Value Date   AST 30 05/15/2016   ALT 40 05/15/2016   ALBUMIN 4.7 05/15/2016   ALKPHOS 87 05/15/2016   HCVAB NEGATIVE 06/14/2012   AMYLASE 26 12/28/2012   LIPASE 32 05/15/2016   AMMONIA 27 06/27/2010                 Electrolytes Lab Results  Component Value Date  NA 138 05/15/2016   K 3.4 (L) 05/15/2016   CL 101 05/15/2016   CALCIUM 9.0 05/15/2016   MG 2.1 03/18/2016   PHOS 4.1 06/25/2012                 Neuropathy Markers Lab Results  Component Value Date   VITAMINB12 244 03/18/2016   FOLATE 3.0 (L) 06/07/2013   HGBA1C 6.0 04/03/2015   HIV NON REACTIVE 06/12/2012                 Bone Pathology Markers Lab Results  Component Value Date   VD25OH 21 (L) 07/04/2014   25OHVITD1 35 03/18/2016   25OHVITD2 1.0 03/18/2016   25OHVITD3 34 03/18/2016   TESTOFREE 78.9 09/16/2014   TESTOSTERONE 365 09/16/2014                 Coagulation Parameters Lab Results  Component Value Date   INR 1.04 06/07/2013   LABPROT 13.4 06/07/2013   PLT 298 05/15/2016   DDIMER <0.27 11/13/2015                 Cardiovascular Markers Lab Results  Component Value Date   BNP 23.9 02/27/2015   CKTOTAL 201 01/22/2012   CKMB 2.5 01/22/2012   TROPONINI <0.03 11/07/2015   HGB 17.7 (H) 05/15/2016   HCT 49.2 05/15/2016                 CA Markers No results found for: CEA, CA125, LABCA2               Note: Lab results reviewed.  Recent Diagnostic Imaging Results  DG C-Arm 1-60 Min-No Report Fluoroscopy was utilized by the requesting physician.  No radiographic  interpretation.   Complexity Note: Imaging results reviewed. Results shared with Mr. Manthei, using Layman's terms.                         Meds   Current Outpatient Medications:  .  cyclobenzaprine (FLEXERIL)  10 MG tablet, Take 1 tablet (10 mg total) by mouth at bedtime., Disp: 30 tablet, Rfl: 2 .  gabapentin (NEURONTIN) 800 MG tablet, Take 1 tablet (800 mg total) by mouth 4 (four) times daily., Disp: 120 tablet, Rfl: 2 .  losartan (COZAAR) 25 MG tablet, Take 25 mg daily by mouth., Disp: , Rfl:  .  metoprolol (LOPRESSOR) 50 MG tablet, Take 1 tablet (50 mg total) by mouth 2 (two) times daily., Disp: 60 tablet, Rfl: 11 .  traMADol (ULTRAM) 50 MG tablet, Take 2 tablets (100 mg total) by mouth 4 (four) times daily. 1-2 tablets, Disp: 240 tablet, Rfl: 2 .  traZODone (DESYREL) 100 MG tablet, Take 100 mg by mouth at bedtime as needed., Disp: , Rfl: 1 .  VENTOLIN HFA 108 (90 Base) MCG/ACT inhaler, INHALE 2 PUFFS INTO THE LUNGS EVERY 4 HOURS AS NEEDED FOR WHEEZING OR SHORTNESS OF BREATH., Disp: 18 g, Rfl: 1  ROS  Constitutional: Denies any fever or chills Gastrointestinal: No reported hemesis, hematochezia, vomiting, or acute GI distress Musculoskeletal: Denies any acute onset joint swelling, redness, loss of ROM, or weakness Neurological: No reported episodes of acute onset apraxia, aphasia, dysarthria, agnosia, amnesia, paralysis, loss of coordination, or loss of consciousness  Allergies  Mr. Hays is allergic to cozaar [losartan] and lisinopril.  [place]  Drug: Mr. Lacivita  reports that he does not use drugs. Alcohol:  reports that he does not drink alcohol. Tobacco:  reports that he has  been smoking cigarettes.  He has a 15.00 pack-year smoking history. he has never used smokeless tobacco. Medical:  has a past medical history of Alcohol withdrawal ([place]), Allergy, Anxiety (Dx 2014), COPD (chronic obstructive pulmonary disease) ([place]), Drug abuse ([place]), Enlarged prostate, ETOH abuse, GERD (gastroesophageal reflux disease) (Dx 2003), Headache(784.0), Hepatitis, Hyperlipidemia (Dx 2000), Hypertension (Dx 2011), IBS (irritable bowel syndrome), Irregular heart beat, Long Q-T syndrome (01/23/2014), Mental  disorder, Neuromuscular disorder ([place]), Neuropathy (06/07/2013), Seizure due to alcohol withdrawal ([place]) (01/23/2014), Shortness of breath, and Withdrawal seizures ([place]) (2014). Surgical: Mr. Statzer  has a past surgical history that includes Back surgery (1995, 1999); Neck surgery (2000); Nasal sinus surgery; left heart catheterization with coronary angiogram (N/A, 01/13/2014); Subacromial decompression (Right, 11/16/2014); Nissen fundoplication; 24 hour ph study (N/A, 01/16/2015); Upper gastrointestinal endoscopy; and Colonoscopy (1998). Family: family history includes Alcohol abuse in his father and paternal grandfather; Breast cancer in his paternal grandmother; Colon polyps in his mother; Hypertension in his mother; Lung cancer in his father.  Constitutional Exam  General appearance: Well nourished, well developed, and well hydrated. In no apparent acute distress Vitals:   03/25/17 1109  BP: (!) 139/91  Pulse: 68  Resp: 16  Temp: 98.7 F (37.1 C)  TempSrc: Oral  SpO2: 99%  Weight: 215 lb (97.5 kg)  Height: '6\' 4"'$  (1.93 m)   BMI Assessment: Estimated body mass index is 26.17 kg/m as calculated from the following:   Height as of this encounter: '6\' 4"'$  (1.93 m).   Weight as of this encounter: 215 lb (97.5 kg). Psych/Mental status: Alert, oriented x 3 (person, place, & time)       Eyes: PERLA Respiratory: No evidence of acute respiratory distress  Cervical Spine Area Exam  Skin & Axial Inspection: No masses, redness, edema, swelling, or associated skin lesions Alignment: Symmetrical Functional ROM: Unrestricted ROM      Stability: No instability detected Muscle Tone/Strength: Functionally intact. No obvious neuro-muscular anomalies detected. Sensory (Neurological): Unimpaired Palpation: No palpable anomalies              Upper Extremity (UE) Exam    Side: Right upper extremity  Side: Left upper extremity  Skin & Extremity Inspection: Skin color, temperature, and hair growth are WNL.  No peripheral edema or cyanosis. No masses, redness, swelling, asymmetry, or associated skin lesions. No contractures.  Skin & Extremity Inspection: Skin color, temperature, and hair growth are WNL. No peripheral edema or cyanosis. No masses, redness, swelling, asymmetry, or associated skin lesions. No contractures.  Functional ROM: Unrestricted ROM          Functional ROM: Unrestricted ROM          Muscle Tone/Strength: Functionally intact. No obvious neuro-muscular anomalies detected.  Muscle Tone/Strength: Functionally intact. No obvious neuro-muscular anomalies detected.  Sensory (Neurological): Unimpaired          Sensory (Neurological): Unimpaired          Palpation: No palpable anomalies              Palpation: No palpable anomalies              Specialized Test(s): Deferred         Specialized Test(s): Deferred          Thoracic Spine Area Exam  Skin & Axial Inspection: No masses, redness, or swelling Alignment: Symmetrical Functional ROM: Unrestricted ROM Stability: No instability detected Muscle Tone/Strength: Functionally intact. No obvious neuro-muscular anomalies detected. Sensory (Neurological): Unimpaired Muscle strength & Tone: No palpable  anomalies  Lumbar Spine Area Exam  Skin & Axial Inspection: No masses, redness, or swelling Alignment: Symmetrical Functional ROM: Unrestricted ROM      Stability: No instability detected Muscle Tone/Strength: Functionally intact. No obvious neuro-muscular anomalies detected. Sensory (Neurological): Unimpaired Palpation: No palpable anomalies       Provocative Tests: Lumbar Hyperextension and rotation test: evaluation deferred today       Lumbar Lateral bending test: evaluation deferred today       Patrick's Maneuver: evaluation deferred today                    Gait & Posture Assessment  Ambulation: Unassisted Gait: Relatively normal for age and body habitus Posture: WNL   Lower Extremity Exam    Side: Right lower extremity   Side: Left lower extremity  Skin & Extremity Inspection: Skin color, temperature, and hair growth are WNL. No peripheral edema or cyanosis. No masses, redness, swelling, asymmetry, or associated skin lesions. No contractures.  Skin & Extremity Inspection: Skin color, temperature, and hair growth are WNL. No peripheral edema or cyanosis. No masses, redness, swelling, asymmetry, or associated skin lesions. No contractures.  Functional ROM: Unrestricted ROM          Functional ROM: Unrestricted ROM          Muscle Tone/Strength: Functionally intact. No obvious neuro-muscular anomalies detected.  Muscle Tone/Strength: Functionally intact. No obvious neuro-muscular anomalies detected.  Sensory (Neurological): Unimpaired  Sensory (Neurological): Unimpaired  Palpation: No palpable anomalies  Palpation: No palpable anomalies   Assessment  Primary Diagnosis & Pertinent Problem List: The primary encounter diagnosis was Chronic low back pain (Secondary source of pain) (Bilateral) (R>L). Diagnoses of Neuropathy, Peripheral (Feet) ([name]) (Location of Primary Source of Pain) (Bilateral) (R>L), Lumbar facet syndrome (Bilateral) (R>L), Chronic pain syndrome, Musculoskeletal pain, Long term current use of opiate analgesic, and Chronic lower extremity pain (Bilateral) (R>L) were also pertinent to this visit.  Status Diagnosis  Controlled Controlled Controlled 1. Chronic low back pain (Secondary source of pain) (Bilateral) (R>L)   2. Neuropathy, Peripheral (Feet) ([name]) (Location of Primary Source of Pain) (Bilateral) (R>L)   3. Lumbar facet syndrome (Bilateral) (R>L)   4. Chronic pain syndrome   5. Musculoskeletal pain   6. Long term current use of opiate analgesic   7. Chronic lower extremity pain (Bilateral) (R>L)     Problems updated and reviewed during this visit: No problems updated. Plan of Care  Pharmacotherapy (Medications Ordered): Meds ordered this encounter  Medications  . traMADol (ULTRAM) 50 MG  tablet    Sig: Take 2 tablets (100 mg total) by mouth 4 (four) times daily. 1-2 tablets    Dispense:  240 tablet    Refill:  2    Fill one day early if pharmacy is closed on scheduled refill date. Do not fill until: 12/23/2016 To last until: 03/23/2017    Order Specific Question:   Supervising Provider    Answer:   Milinda Pointer [phone]  . cyclobenzaprine (FLEXERIL) 10 MG tablet    Sig: Take 1 tablet (10 mg total) by mouth at bedtime.    Dispense:  30 tablet    Refill:  2    Do not place medication on "Automatic Refill". Fill one day early if pharmacy is closed on scheduled refill date.    Order Specific Question:   Supervising Provider    Answer:   Milinda Pointer [phone]  . gabapentin (NEURONTIN) 800 MG tablet    Sig: Take 1  tablet (800 mg total) by mouth 4 (four) times daily.    Dispense:  120 tablet    Refill:  2    Do not place medication on "Automatic Refill". Fill one day early if pharmacy is closed on scheduled refill date.    Order Specific Question:   Supervising Provider    Answer:   Milinda Pointer [[id number]]   New Prescriptions   No medications on file   Medications administered today: [name] had no medications administered during this visit. Lab-work, procedure(s), and/or referral(s): Orders Placed This Encounter  Procedures  . MR LUMBAR SPINE WO CONTRAST  . ToxASSURE Select 13 (MW), Urine   Imaging and/or referral(s): MR LUMBAR SPINE WO CONTRAST  Interventional therapies: Planned, scheduled, and/or pending:  MRI  Encourage follow up with PCP call sooner if urinary systems porgress   Considering:  Lower extremity EMG/PNCV. (Radiculopathy versus peripheral neuropathy) Upper extremity EMG/PNCV.  Possibleright-sided L3 + L4 Lumbarsympathetic RFA Possible Racz procedure Diagnostic cervical epidural steroidinjection  Diagnostic bilateral cervical facet block  Possible bilateral cervical facet RFA Diagnostic bilateral lumbar facet  block Possible bilateral lumbar facet RFA Diagnostic right intra-articular shoulder joint injection Diagnostic right suprascapular nerveblock Possibleright suprascapular RFA Diagnostic bilateral intra-articular knee joint injection Possible bilateral series of 5 Hyalgan knee injections Diagnostic bilateral Genicular nerve block Possible bilateral Genicular RFA Diagnostic bilateral greater occipital nerve block Possible bilateral greater occipital nerve RFA Diagnostic bilateral C2 +TON nerve block Possible bilateral C2 + TON RFA   Palliative PRN treatment(s):  Diagnostic Caudal ESI#3 Diagnostic right-sided L3 + L4 lumbar sympathetic block#3       Provider-requested follow-up: Return in about 3 months (around 06/23/2017) for MedMgmt with Me Dionisio David), MRI.  Future Appointments  Date Time Provider [place]  04/03/2017  3:45 PM Ladell Pier, MD [facility] None  06/23/2017 11:00 AM Vevelyn Francois, NP [facility] None   Primary Care Physician: Boykin Nearing, MD Location: [facility] Note by: Vevelyn Francois NP Date: 03/25/2017; Time: 1:15 PM  Pain Score Disclaimer: We use the NRS-11 scale. This is a self-reported, subjective measurement of pain severity with only modest accuracy. It is used primarily to identify changes within a particular patient. It must be understood that outpatient pain scales are significantly less accurate that those used for research, where they can be applied under ideal controlled circumstances with minimal exposure to variables. In reality, the score is likely to be a combination of pain intensity and pain affect, where pain affect describes the degree of emotional arousal or changes in action readiness caused by the sensory experience of pain. Factors such as social and work situation, setting, emotional state, anxiety levels, expectation, and prior pain experience may influence pain perception and  show large inter-individual differences that may also be affected by time variables.  Patient instructions provided during this appointment: Patient Instructions   ____________________________________________________________________________________________  Medication Rules  Applies to: All patients receiving prescriptions (written or electronic).  Pharmacy of record: Pharmacy where electronic prescriptions will be sent. If written prescriptions are taken to a different pharmacy, please inform the nursing staff. The pharmacy listed in the electronic medical record should be the one where you would like electronic prescriptions to be sent.  Prescription refills: Only during scheduled appointments. Applies to both, written and electronic prescriptions.  NOTE: The following applies primarily to controlled substances (Opioid* Pain Medications).   Patient's responsibilities: 1. Pain Pills: Bring all pain pills to every appointment (except for procedure appointments).  2. Pill Bottles: Bring pills in original pharmacy bottle. Always bring newest bottle. Bring bottle, even if empty. 3. Medication refills: You are responsible for knowing and keeping track of what medications you need refilled. The day before your appointment, write a list of all prescriptions that need to be refilled. Bring that list to your appointment and give it to the admitting nurse. Prescriptions will be written only during appointments. If you forget a medication, it will not be "Called in", "Faxed", or "electronically sent". You will need to get another appointment to get these prescribed. 4. Prescription Accuracy: You are responsible for carefully inspecting your prescriptions before leaving our office. Have the discharge nurse carefully go over each prescription with you, before taking them home. Make sure that your name is accurately spelled, that your address is correct. Check the name and dose of your medication to make sure  it is accurate. Check the number of pills, and the written instructions to make sure they are clear and accurate. Make sure that you are given enough medication to last until your next medication refill appointment. 5. Taking Medication: Take medication as prescribed. Never take more pills than instructed. Never take medication more frequently than prescribed. Taking less pills or less frequently is permitted and encouraged, when it comes to controlled substances (written prescriptions).  6. Inform other Doctors: Always inform, all of your healthcare providers, of all the medications you take. 7. Pain Medication from other Providers: You are not allowed to accept any additional pain medication from any other Doctor or Healthcare provider. There are two exceptions to this rule. (see below) In the event that you require additional pain medication, you are responsible for notifying us, as stated below. 8. Medication Agreement: You are responsible for carefully reading and following our Medication Agreement. This must be signed before receiving any prescriptions from our practice. Safely store a copy of your signed Agreement. Violations to the Agreement will result in no further prescriptions. (Additional copies of our Medication Agreement are available upon request.) 9. Laws, Rules, & Regulations: All patients are expected to follow all Federal and [employer], [employer], Rules, [employer]. Ignorance of the Laws does not constitute a valid excuse. The use of any illegal substances is prohibited. 10. Adopted CDC guidelines & recommendations: Target dosing levels will be at or below 60 MME/day. Use of benzodiazepines** is not recommended.  Exceptions: There are only two exceptions to the rule of not receiving pain medications from other Healthcare Providers. 1. Exception #1 (Emergencies): In the event of an emergency (i.e.: accident requiring emergency care), you are allowed to receive additional pain  medication. However, you are responsible for: As soon as you are able, call our office [phone], at any time of the day or night, and leave a message stating your name, the date and nature of the emergency, and the name and dose of the medication prescribed. In the event that your call is answered by a member of our staff, make sure to document and save the date, time, and the name of the person that took your information.  2. Exception #2 (Planned Surgery): In the event that you are scheduled by another doctor or dentist to have any type of surgery or procedure, you are allowed (for a period no longer than 30 days), to receive additional pain medication, for the acute post-op pain. However, in this case, you are responsible for picking up a copy of our "Post-op Pain Management for Surgeons" handout, and giving it  to your surgeon or dentist. This document is available at our office, and does not require an appointment to obtain it. Simply go to our office during business hours (Monday-Thursday from 8:00 AM to 4:00 PM) (Friday 8:00 AM to 12:00 Noon) or if you have a scheduled appointment with [place], prior to your surgery, and ask for it by name. In addition, you will need to provide [place] with your name, name of your surgeon, type of surgery, and date of procedure or surgery.  *Opioid medications include: morphine, codeine, oxycodone, oxymorphone, hydrocodone, hydromorphone, meperidine, tramadol, tapentadol, buprenorphine, fentanyl, methadone. **Benzodiazepine medications include: diazepam (Valium), alprazolam (Xanax), clonazepam (Klonopine), lorazepam (Ativan), clorazepate (Tranxene), chlordiazepoxide (Librium), estazolam (Prosom), oxazepam (Serax), temazepam (Restoril), triazolam (Halcion)  ____________________________________________________________________________________________

## 2017-03-25 NOTE — Patient Instructions (Signed)

## 2017-03-25 NOTE — Telephone Encounter (Signed)
Attempted to call Pharm. Unable to speak with someone. disconnected

## 2017-03-25 NOTE — Progress Notes (Signed)
Nursing Pain Medication Assessment:  Safety precautions to be maintained throughout the outpatient stay will include: orient to surroundings, keep bed in low position, maintain call bell within reach at all times, provide assistance with transfer out of bed and ambulation.  Medication Inspection Compliance: Pill count conducted under aseptic conditions, in front of the patient. Neither the pills nor the bottle was removed from the patient's sight at any time. Once count was completed pills were immediately returned to the patient in their original bottle.  Medication: Tramadol (Ultram) Pill/Patch Count: 35 of 240 pills remain Pill/Patch Appearance: Markings consistent with prescribed medication Bottle Appearance: Standard pharmacy container. Clearly labeled. Filled Date: 01 / 02 / 2019 Last Medication intake:  Today

## 2017-03-25 NOTE — Telephone Encounter (Signed)
Pharmacy has questions about scripts given to patient, needs to discuss before filling scripts Please call asap

## 2017-03-25 NOTE — Telephone Encounter (Signed)
I failed to change the of the Tramadol. He is not to get this filled until the actual due date 30 days. No early refills.

## 2017-03-26 MED FILL — traMADol HCL 50 MG TABS: 50 | 30 days supply | Qty: 240 | Fill #0

## 2017-03-26 NOTE — Telephone Encounter (Signed)
Pharmacy questions answered. 

## 2017-04-03 ENCOUNTER — Encounter: Payer: Self-pay | Admitting: Internal Medicine

## 2017-04-03 ENCOUNTER — Ambulatory Visit: Payer: Self-pay | Attending: Internal Medicine | Admitting: Internal Medicine

## 2017-04-03 VITALS — BP 120/82 | HR 57 | Temp 98.0°F | Resp 16 | Ht 76.0 in | Wt 207.8 lb

## 2017-04-03 DIAGNOSIS — N401 Enlarged prostate with lower urinary tract symptoms: Secondary | ICD-10-CM | POA: Insufficient documentation

## 2017-04-03 DIAGNOSIS — I420 Dilated cardiomyopathy: Secondary | ICD-10-CM | POA: Insufficient documentation

## 2017-04-03 DIAGNOSIS — Z79899 Other long term (current) drug therapy: Secondary | ICD-10-CM | POA: Insufficient documentation

## 2017-04-03 DIAGNOSIS — I10 Essential (primary) hypertension: Secondary | ICD-10-CM | POA: Insufficient documentation

## 2017-04-03 DIAGNOSIS — F419 Anxiety disorder, unspecified: Secondary | ICD-10-CM | POA: Insufficient documentation

## 2017-04-03 DIAGNOSIS — F17210 Nicotine dependence, cigarettes, uncomplicated: Secondary | ICD-10-CM | POA: Insufficient documentation

## 2017-04-03 DIAGNOSIS — F17200 Nicotine dependence, unspecified, uncomplicated: Secondary | ICD-10-CM

## 2017-04-03 DIAGNOSIS — R062 Wheezing: Secondary | ICD-10-CM | POA: Insufficient documentation

## 2017-04-03 DIAGNOSIS — Z888 Allergy status to other drugs, medicaments and biological substances status: Secondary | ICD-10-CM | POA: Insufficient documentation

## 2017-04-03 DIAGNOSIS — K219 Gastro-esophageal reflux disease without esophagitis: Secondary | ICD-10-CM | POA: Insufficient documentation

## 2017-04-03 DIAGNOSIS — G894 Chronic pain syndrome: Secondary | ICD-10-CM | POA: Insufficient documentation

## 2017-04-03 DIAGNOSIS — F331 Major depressive disorder, recurrent, moderate: Secondary | ICD-10-CM | POA: Insufficient documentation

## 2017-04-03 DIAGNOSIS — N529 Male erectile dysfunction, unspecified: Secondary | ICD-10-CM | POA: Insufficient documentation

## 2017-04-03 DIAGNOSIS — R351 Nocturia: Secondary | ICD-10-CM | POA: Insufficient documentation

## 2017-04-03 MED ORDER — TAMSULOSIN HCL 0.4 MG PO CAPS: 0.4000 mg | ORAL_CAPSULE | Freq: Every day | ORAL | 3 refills | Status: DC

## 2017-04-03 MED ORDER — ALBUTEROL SULFATE HFA 108 (90 BASE) MCG/ACT IN AERS: 2.0000 | INHALATION_SPRAY | Freq: Four times a day (QID) | RESPIRATORY_TRACT | 3 refills | Status: DC | PRN

## 2017-04-03 MED ORDER — TADALAFIL 5 MG PO TABS
5.0000 mg | ORAL_TABLET | Freq: Every day | ORAL | 1 refills | Status: DC
Start: 2017-04-03 — End: 2017-06-11

## 2017-04-03 NOTE — Progress Notes (Signed)
Patient ID: Mark [name], male    DOB: [birth year]  MRN: [id number]  CC: re-establish   Subjective: Mark [name] is a 52 y.o. male who presents to become est with me as PCP His concerns today include:  Patient with history of HTN, HL, tob dep, VT, dilated centimeters secondary to EtOH, long QT, chronic radicular LBP, CAD  1.  Cardiology: seen in f/u a few mths ago.  I do not see notes in the system. He reports his cardiologist is Dr. Terrence Dupont Metoprolol increase to 50 mg BID and Cozaar Limits salt in foods  2.  Tob dep : was 2 pks/day.  Down to 5 cig/day Cutting back gradually Tried Chantix in past but stopped taking because of bad dreams  3.  ETOH: clean for at least 1 yr   4.  Anxiety:  Constant feeling of restlessness.  "I can't sit still feel like going to explode." Saw psychiatrist, Dr. Daron Offer, last yr.  Wanted benzos but he declined and instead increase Gabapentin and added Propranolol (he no longer takes the latter).   5.  Having problem getting erection and with urine flow.  Was on Cilias in past and would like to get on it again Weak stream with stop and go urine.  Sometimes he has to strian Wakes up 2-3 x a nights to urinate.  Does not feel that he completely empties each time  6.  Chronic LBP: followed by pain specialist at [facility] On Tramadol, Gabapentin.  In the future he would like to explore seeing a neurologist or neurosurgeon Patient Active Problem List   Diagnosis Date Noted  . Opioid-induced hyperalgesia (with oxycodone) 02/11/2017  . Acute postoperative pain 12/26/2016  . Allodynia (Lower extremities) 12/26/2016  . Hyperalgesia (Lower extremities) 12/26/2016  . Sympathetic pain (lower extremity) 09/26/2016  . Lower extremity neuropathy (Primary Source of Pain) (Bilateral) (R>L) 09/26/2016  . Lumbar facet syndrome (Bilateral) (R>L) 08/22/2016  . Chronic sacroiliac joint pain (Bilateral) (R>L) 08/22/2016  . Chronic lower extremity pain (Bilateral) (R>L)  06/26/2016  . Opiate use 05/14/2016  . Occipital headache (Bilateral) (R>L) 04/16/2016  . Musculoskeletal pain 04/15/2016  . 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 Major depressive disorder, recurrent episode, moderate ([place]) 04/10/2016  . Long term current use of opiate analgesic 03/18/2016  . Long term prescription opiate use 03/18/2016  . History of fusion of cervical spine 03/18/2016  . Chronic neck pain (Bilateral)(R>L) 03/18/2016  . Cervical fusion syndrome 03/18/2016  . 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 Chronic knee pain  (Bilateral) (R>L) 03/18/2016  . GAD (generalized anxiety disorder) 12/06/2015  . Alcohol use disorder, severe, in early remission ([place]) 12/06/2015  . Suicidal ideation 11/07/2015  . Non-sustained ventricular tachycardia ([place])   . GERD (gastroesophageal reflux disease) 07/24/2015  . Chronic anxiety 12/19/2014  . 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 Erectile dysfunction 09/16/2014  . Poor dentition 07/28/2014  . Chronic maxillary sinusitis 07/28/2014  . Vitamin D insufficiency 07/05/2014  . Closed fracture of 5th metacarpal 07/04/2014  . Chronic fatigue 07/04/2014  . Chronic radicular low back pain [facility] source of pain) (Right) (to calf) 06/02/2014  . Chronic shoulder pain (Right) 05/12/2014  . Alcohol use disorder, severe, dependence ([place])   . 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 Hypokalemia   . Opioid dependence ([place]) 01/22/2014    Class: Acute  . Paroxysmal VT ([place]) 12/19/2013  . Chronic low back pain (Secondary source of pain) (Bilateral) (R>L) 12/28/2012  . Chronic pain syndrome 12/28/2012  .  Alcohol intoxication ([place]) 06/09/2012  . 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 Nonsustained ventricular tachycardia ([place]) 01/20/2012  . Tobacco abuse 01/19/2012  . Essential hypertension   . Hyperlipidemia      Current Outpatient Medications on File Prior to Visit  Medication Sig Dispense Refill  . cyclobenzaprine  (FLEXERIL) 10 MG tablet Take 1 tablet (10 mg total) by mouth at bedtime. 30 tablet 2  . gabapentin (NEURONTIN) 800 MG tablet Take 1 tablet (800 mg total) by mouth 4 (four) times daily. 120 tablet 2  . losartan (COZAAR) 25 MG tablet Take 25 mg daily by mouth.    . metoprolol (LOPRESSOR) 50 MG tablet Take 1 tablet (50 mg total) by mouth 2 (two) times daily. 60 tablet 11  . traMADol (ULTRAM) 50 MG tablet Take 2 tablets (100 mg total) by mouth 4 (four) times daily. 1-2 tablets 240 tablet 2  . traZODone (DESYREL) 100 MG tablet Take 100 mg by mouth at bedtime as needed.  1   No current facility-administered medications on file prior to visit.     Allergies  Allergen Reactions  . Cozaar [Losartan] Anaphylaxis  . Lisinopril Anaphylaxis    Social History   Socioeconomic History  . Marital status: Divorced    Spouse name: Not on file  . Number of children: 2  . Years of education: Not on file  . Highest education level: Not on file  Social Needs  . Financial resource strain: Not on file  . Food insecurity - worry: Not on file  . Food insecurity - inability: Not on file  . Transportation needs - medical: Not on file  . Transportation needs - non-medical: Not on file  Occupational History  . Occupation: [employer]   Tobacco Use  . Smoking status: Current Every Day Smoker    Packs/day: 0.50    Years: 30.00    Pack years: 15.00    Types: Cigarettes  . Smokeless tobacco: Never Used  . Tobacco comment: 11-30-2014 info given, Has cut back from 2 pks to 1/2 pack and "working on it"  Substance and Sexual Activity  . Alcohol use: No    Alcohol/week: 0.0 oz    Comment: occ; binge drinking occasionally  . Drug use: No    Comment: Opana  . Sexual activity: Not Currently  Other Topics Concern  . Not on file  Social History Narrative  . Not on file    Family History  Problem Relation Age of Onset  . Lung cancer Father        was a smoker  . Alcohol abuse Father   . Hypertension Mother     . Colon polyps Mother   . Breast cancer Paternal Grandmother   . Alcohol abuse Paternal Grandfather   . Colon cancer Neg Hx   . Rectal cancer Neg Hx   . Stomach cancer Neg Hx     Past Surgical History:  Procedure Laterality Date  . [place] STUDY N/A 01/16/2015   Procedure: [place] STUDY;  Surgeon: Manus Gunning, MD;  Location: [facility];  Service: Gastroenterology;  Laterality: N/A;  . [place]  . COLONOSCOPY  1998  . LEFT HEART CATHETERIZATION WITH CORONARY ANGIOGRAM N/A 01/13/2014   Procedure: LEFT HEART CATHETERIZATION WITH CORONARY ANGIOGRAM;  Surgeon: Clent Demark, MD;  Location: [facility];  Service: Cardiovascular;  Laterality: N/A;  . NASAL SINUS SURGERY    . NECK SURGERY  2000  cervial fusion   . [place] FUNDOPLICATION    . SUBACROMIAL DECOMPRESSION Right 11/16/2014   Procedure: RIGHT SHOULDER ARTHROSCOPY WITH SUBACROMIAL DECOMPRESSION ;  Surgeon: Leandrew Koyanagi, MD;  Location: [facility];  Service: Orthopedics;  Laterality: Right;  . UPPER GASTROINTESTINAL ENDOSCOPY      ROS: Review of Systems As above.  He requested RF on Albuterol  PHYSICAL EXAM: BP 120/82   Pulse (!) 57   Temp 98 F (36.7 C) (Oral)   Resp 16   Ht 6\' 4"  (1.93 m)   Wt 207 lb 12.8 oz (94.3 kg)   SpO2 95%   BMI 25.29 kg/m   Physical Exam  General appearance - alert, well appearing, and in no distress Mental status - alert, oriented to person, place, and time, normal mood, behavior, speech, dress, motor activity, and thought processes Neck - supple, no significant adenopathy Chest - clear to auscultation, no wheezes, rales or rhonchi, symmetric air entry Heart - normal rate, regular rhythm, normal S1, S2, no murmurs, rubs, clicks or gallops Rectal - good rectal tune. Prostate feels firm and enlarged.  No nodules Extremities -no LE edema  ASSESSMENT AND PLAN: 1. Erectile dysfunction, unspecified erectile dysfunction type -start Cialis.  Pt  prefers low dose daily use.  May also help with BPH symptoms Went over possible S.E.   2. Wheezing - albuterol (VENTOLIN HFA) 108 (90 Base) MCG/ACT inhaler; Inhale 2 puffs into the lungs every 6 (six) hours as needed for wheezing or shortness of breath.  Dispense: 18 g; Refill: 3  3. Tobacco dependence Commended him on his efforts to quit.  Continue cutting back with goal of quitting  4. BPH associated with nocturia -trail of Flomax.  Advised to go slow with position changes - PSA; Future - Urinalysis; Future - Ambulatory referral to Urology  5. Dilated cardiomyopathy ([place]) 6. Essential hypertension -at goal.  Continue current meds - CBC; Future - Lipid panel; Future - Comprehensive metabolic panel; Future  Patient was given the opportunity to ask questions.  Patient verbalized understanding of the plan and was able to repeat key elements of the plan.   Orders Placed This Encounter  Procedures  . CBC  . Lipid panel  . Comprehensive metabolic panel  . PSA  . Urinalysis  . Ambulatory referral to Urology     Requested Prescriptions   Signed Prescriptions Disp Refills  . tamsulosin (FLOMAX) 0.4 MG CAPS capsule 30 capsule 3    Sig: Take 1 capsule (0.4 mg total) by mouth daily after supper.  . tadalafil (CIALIS) 5 MG tablet 30 tablet 1    Sig: Take 1 tablet (5 mg total) by mouth daily.  Marland Kitchen albuterol (VENTOLIN HFA) 108 (90 Base) MCG/ACT inhaler 18 g 3    Sig: Inhale 2 puffs into the lungs every 6 (six) hours as needed for wheezing or shortness of breath.    Return in about 4 months (around 08/01/2017).  Karle Plumber, MD, FACP

## 2017-04-04 MED FILL — ?TAMSULOSIN HCL 0.4 MG CAP: 0.4 | 30 days supply | Qty: 30 | Fill #0

## 2017-04-04 MED FILL — !CIALIS 5 MG TABLET: 5 | 30 days supply | Qty: 30 | Fill #0

## 2017-04-04 MED FILL — !VENTOLIN HFA INHALER: 108 (90 BAS | 25 days supply | Qty: 18 | Fill #0

## 2017-04-09 ENCOUNTER — Ambulatory Visit: Payer: Self-pay | Attending: Internal Medicine

## 2017-04-09 DIAGNOSIS — R351 Nocturia: Secondary | ICD-10-CM | POA: Insufficient documentation

## 2017-04-09 DIAGNOSIS — N401 Enlarged prostate with lower urinary tract symptoms: Secondary | ICD-10-CM | POA: Insufficient documentation

## 2017-04-09 DIAGNOSIS — I10 Essential (primary) hypertension: Secondary | ICD-10-CM | POA: Insufficient documentation

## 2017-04-09 NOTE — Progress Notes (Signed)
Patient here for lab visit only 

## 2017-04-10 ENCOUNTER — Other Ambulatory Visit: Payer: Self-pay | Admitting: Family Medicine

## 2017-04-10 LAB — COMPREHENSIVE METABOLIC PANEL
ALK PHOS: 115 IU/L (ref 39–117)
ALT: 22 IU/L (ref 0–44)
AST: 16 IU/L (ref 0–40)
Albumin/Globulin Ratio: 2.3 — ABNORMAL HIGH (ref 1.2–2.2)
Albumin: 4.6 g/dL (ref 3.5–5.5)
BUN/Creatinine Ratio: 12 (ref 9–20)
BUN: 12 mg/dL (ref 6–24)
Bilirubin Total: 0.5 mg/dL (ref 0.0–1.2)
CALCIUM: 9.9 mg/dL (ref 8.7–10.2)
CO2: 27 mmol/L (ref 20–29)
CREATININE: 1 mg/dL (ref 0.76–1.27)
Chloride: 97 mmol/L (ref 96–106)
GFR calc Af Amer: 100 mL/min/{1.73_m2} (ref >59)
GFR, EST NON AFRICAN AMERICAN: 87 mL/min/{1.73_m2} (ref >59)
GLOBULIN, TOTAL: 2 g/dL (ref 1.5–4.5)
GLUCOSE: 109 mg/dL — AB (ref 65–99)
Potassium: 4.6 mmol/L (ref 3.5–5.2)
SODIUM: 139 mmol/L (ref 134–144)
Total Protein: 6.6 g/dL (ref 6.0–8.5)

## 2017-04-10 LAB — URINALYSIS
BILIRUBIN UA: NEGATIVE
GLUCOSE, UA: NEGATIVE
LEUKOCYTES UA: NEGATIVE
Nitrite, UA: NEGATIVE
PH UA: 6 (ref 5.0–7.5)
PROTEIN UA: NEGATIVE
RBC UA: NEGATIVE
SPEC GRAV UA: 1.026 (ref 1.005–1.030)
Urobilinogen, Ur: 0.2 mg/dL (ref 0.2–1.0)

## 2017-04-10 LAB — CBC
HEMOGLOBIN: 16.4 g/dL (ref 13.0–17.7)
Hematocrit: 45.4 % (ref 37.5–51.0)
MCH: 29.1 pg (ref 26.6–33.0)
MCHC: 36.1 g/dL — AB (ref 31.5–35.7)
MCV: 81 fL (ref 79–97)
Platelets: 293 10*3/uL (ref 150–379)
RBC: 5.63 x10E6/uL (ref 4.14–5.80)
RDW: 14.3 % (ref 12.3–15.4)
WBC: 10.4 10*3/uL (ref 3.4–10.8)

## 2017-04-10 LAB — LIPID PANEL
CHOL/HDL RATIO: 6.1 ratio — AB (ref 0.0–5.0)
CHOLESTEROL TOTAL: 212 mg/dL — AB (ref 100–199)
HDL: 35 mg/dL — AB (ref >39)
LDL CALC: 135 mg/dL — AB (ref 0–99)
TRIGLYCERIDES: 211 mg/dL — AB (ref 0–149)
VLDL CHOLESTEROL CAL: 42 mg/dL — AB (ref 5–40)

## 2017-04-10 LAB — PSA: Prostate Specific Ag, Serum: 1.7 ng/mL (ref 0.0–4.0)

## 2017-04-10 MED ORDER — ATORVASTATIN CALCIUM 20 MG PO TABS: 20.0000 mg | ORAL_TABLET | Freq: Every day | ORAL | 3 refills | Status: DC

## 2017-04-15 MED FILL — ?ATORVASTATIN 20MG TABLET: 20 | 30 days supply | Qty: 30 | Fill #0

## 2017-04-15 MED FILL — GABAPENTIN 800 MG TABLET: 800 | 30 days supply | Qty: 120 | Fill #4

## 2017-04-15 MED FILL — ?METOPROLOL TARTRTE 50MG TA: 50 | 30 days supply | Qty: 90 | Fill #2

## 2017-04-15 MED FILL — LOSARTAN POTASSIUM 50 MG TA: 50 | 30 days supply | Qty: 30 | Fill #3

## 2017-04-23 MED FILL — traMADol HCL 50 MG TABS: 50 | 30 days supply | Qty: 240 | Fill #1

## 2017-05-05 MED FILL — traZODone HCL 100 MG TABS: 100 | 30 days supply | Qty: 30 | Fill #4

## 2017-05-05 MED FILL — CYCLOBENZAPRINE 10 MG TAB: 10 | 30 days supply | Qty: 30 | Fill #1

## 2017-05-05 MED FILL — !CIALIS 5 MG TABLET: 5 | 30 days supply | Qty: 30 | Fill #1

## 2017-05-14 MED FILL — LOSARTAN POTASSIUM 50 MG TA: 50 | 30 days supply | Qty: 30 | Fill #1

## 2017-05-14 MED FILL — ?METOPROLOL 50 MG TABLET: 50 | 30 days supply | Qty: 90 | Fill #3

## 2017-05-21 MED FILL — traMADol HCL 50 MG TABS: 50 | 30 days supply | Qty: 240 | Fill #2

## 2017-05-22 ENCOUNTER — Ambulatory Visit (HOSPITAL_COMMUNITY)
Admission: RE | Admit: 2017-05-22 | Discharge: 2017-05-22 | Disposition: A | Discharge status: Home / Self Care | Payer: Self-pay | Source: Ambulatory Visit | Attending: Nurse Practitioner | Admitting: Nurse Practitioner

## 2017-05-22 DIAGNOSIS — M5441 Lumbago with sciatica, right side: Secondary | ICD-10-CM | POA: Insufficient documentation

## 2017-05-22 DIAGNOSIS — G8929 Other chronic pain: Secondary | ICD-10-CM | POA: Insufficient documentation

## 2017-05-22 DIAGNOSIS — M5136 Other intervertebral disc degeneration, lumbar region: Secondary | ICD-10-CM | POA: Insufficient documentation

## 2017-05-22 DIAGNOSIS — Z981 Arthrodesis status: Secondary | ICD-10-CM | POA: Insufficient documentation

## 2017-05-22 DIAGNOSIS — M47816 Spondylosis without myelopathy or radiculopathy, lumbar region: Secondary | ICD-10-CM | POA: Insufficient documentation

## 2017-05-22 DIAGNOSIS — G629 Polyneuropathy, unspecified: Secondary | ICD-10-CM | POA: Insufficient documentation

## 2017-05-22 DIAGNOSIS — M5442 Lumbago with sciatica, left side: Secondary | ICD-10-CM | POA: Insufficient documentation

## 2017-05-22 DIAGNOSIS — M48061 Spinal stenosis, lumbar region without neurogenic claudication: Secondary | ICD-10-CM | POA: Insufficient documentation

## 2017-05-28 NOTE — Progress Notes (Signed)
Results were reviewed and found to be: mildly abnormal  No acute injury or pathology identified  Review would suggest interventional pain management techniques may be of benefit 

## 2017-06-11 ENCOUNTER — Other Ambulatory Visit: Payer: Self-pay | Admitting: Internal Medicine

## 2017-06-11 MED FILL — traZODone HCL 100 MG TABS: 100 | 30 days supply | Qty: 30 | Fill #5

## 2017-06-11 MED FILL — GABAPENTIN 800 MG TABLET: 800 | 30 days supply | Qty: 120 | Fill #0

## 2017-06-11 MED FILL — CYCLOBENZAPRINE 10 MG TAB: 10 | 30 days supply | Qty: 30 | Fill #2

## 2017-06-12 MED FILL — ?METOPROLOL 50 MG TABLET: 50 | 30 days supply | Qty: 90 | Fill #0

## 2017-06-12 MED FILL — !CIALIS 5 MG TABLET: 5 | 30 days supply | Qty: 30 | Fill #0

## 2017-06-13 ENCOUNTER — Telehealth: Payer: Self-pay | Admitting: Internal Medicine

## 2017-06-13 NOTE — Telephone Encounter (Signed)
[place] would you be able to take care of this papers are up front in medical record bin

## 2017-06-13 NOTE — Telephone Encounter (Signed)
5 page, paperwork recieved through fax 06-13-17.

## 2017-06-19 ENCOUNTER — Ambulatory Visit: Payer: Self-pay | Attending: Nurse Practitioner | Admitting: Nurse Practitioner

## 2017-06-19 ENCOUNTER — Encounter: Payer: Self-pay | Admitting: Nurse Practitioner

## 2017-06-19 ENCOUNTER — Other Ambulatory Visit: Payer: Self-pay

## 2017-06-19 VITALS — BP 155/101 | HR 73 | Temp 97.8°F | Resp 16 | Ht 72.0 in | Wt 213.0 lb

## 2017-06-19 DIAGNOSIS — G894 Chronic pain syndrome: Secondary | ICD-10-CM

## 2017-06-19 DIAGNOSIS — F411 Generalized anxiety disorder: Secondary | ICD-10-CM | POA: Insufficient documentation

## 2017-06-19 DIAGNOSIS — I10 Essential (primary) hypertension: Secondary | ICD-10-CM | POA: Insufficient documentation

## 2017-06-19 DIAGNOSIS — G629 Polyneuropathy, unspecified: Secondary | ICD-10-CM

## 2017-06-19 DIAGNOSIS — M533 Sacrococcygeal disorders, not elsewhere classified: Secondary | ICD-10-CM

## 2017-06-19 DIAGNOSIS — M7918 Myalgia, other site: Secondary | ICD-10-CM

## 2017-06-19 DIAGNOSIS — Q761 Klippel-Feil syndrome: Secondary | ICD-10-CM

## 2017-06-19 DIAGNOSIS — F339 Major depressive disorder, recurrent, unspecified: Secondary | ICD-10-CM | POA: Insufficient documentation

## 2017-06-19 DIAGNOSIS — M545 Low back pain: Secondary | ICD-10-CM | POA: Insufficient documentation

## 2017-06-19 DIAGNOSIS — M5441 Lumbago with sciatica, right side: Secondary | ICD-10-CM

## 2017-06-19 DIAGNOSIS — G8929 Other chronic pain: Secondary | ICD-10-CM

## 2017-06-19 DIAGNOSIS — M5442 Lumbago with sciatica, left side: Secondary | ICD-10-CM

## 2017-06-19 DIAGNOSIS — Z79899 Other long term (current) drug therapy: Secondary | ICD-10-CM | POA: Insufficient documentation

## 2017-06-19 DIAGNOSIS — M542 Cervicalgia: Secondary | ICD-10-CM | POA: Insufficient documentation

## 2017-06-19 DIAGNOSIS — M5416 Radiculopathy, lumbar region: Secondary | ICD-10-CM

## 2017-06-19 DIAGNOSIS — Z5181 Encounter for therapeutic drug level monitoring: Secondary | ICD-10-CM | POA: Insufficient documentation

## 2017-06-19 DIAGNOSIS — Z79891 Long term (current) use of opiate analgesic: Secondary | ICD-10-CM

## 2017-06-19 MED ORDER — TRAMADOL HCL 50 MG PO TABS: 100.0000 mg | ORAL_TABLET | Freq: Four times a day (QID) | ORAL | 2 refills | Status: DC

## 2017-06-19 MED ORDER — GABAPENTIN 800 MG PO TABS: 800.0000 mg | ORAL_TABLET | Freq: Four times a day (QID) | ORAL | 2 refills | Status: DC

## 2017-06-19 MED ORDER — CYCLOBENZAPRINE HCL 10 MG PO TABS: 10.0000 mg | ORAL_TABLET | Freq: Every day | ORAL | 2 refills | Status: DC

## 2017-06-19 MED FILL — traMADol HCL 50 MG TABS: 50 | 30 days supply | Qty: 240 | Fill #0

## 2017-06-19 NOTE — Patient Instructions (Addendum)
____________________________________________________________________________________________  Medication Rules  Applies to: All patients receiving prescriptions (written or electronic).  Pharmacy of record: Pharmacy where electronic prescriptions will be sent. If written prescriptions are taken to a different pharmacy, please inform the nursing staff. The pharmacy listed in the electronic medical record should be the one where you would like electronic prescriptions to be sent.  Prescription refills: Only during scheduled appointments. Applies to both, written and electronic prescriptions.  NOTE: The following applies primarily to controlled substances (Opioid* Pain Medications).   Patient's responsibilities: 1. Pain Pills: Bring all pain pills to every appointment (except for procedure appointments). 2. Pill Bottles: Bring pills in original pharmacy bottle. Always bring newest bottle. Bring bottle, even if empty. 3. Medication refills: You are responsible for knowing and keeping track of what medications you need refilled. The day before your appointment, write a list of all prescriptions that need to be refilled. Bring that list to your appointment and give it to the admitting nurse. Prescriptions will be written only during appointments. If you forget a medication, it will not be "Called in", "Faxed", or "electronically sent". You will need to get another appointment to get these prescribed. 4. Prescription Accuracy: You are responsible for carefully inspecting your prescriptions before leaving our office. Have the discharge nurse carefully go over each prescription with you, before taking them home. Make sure that your name is accurately spelled, that your address is correct. Check the name and dose of your medication to make sure it is accurate. Check the number of pills, and the written instructions to make sure they are clear and accurate. Make sure that you are given enough medication to last  until your next medication refill appointment. 5. Taking Medication: Take medication as prescribed. Never take more pills than instructed. Never take medication more frequently than prescribed. Taking less pills or less frequently is permitted and encouraged, when it comes to controlled substances (written prescriptions).  6. Inform other Doctors: Always inform, all of your healthcare providers, of all the medications you take. 7. Pain Medication from other Providers: You are not allowed to accept any additional pain medication from any other Doctor or Healthcare provider. There are two exceptions to this rule. (see below) In the event that you require additional pain medication, you are responsible for notifying us, as stated below. 8. Medication Agreement: You are responsible for carefully reading and following our Medication Agreement. This must be signed before receiving any prescriptions from our practice. Safely store a copy of your signed Agreement. Violations to the Agreement will result in no further prescriptions. (Additional copies of our Medication Agreement are available upon request.) 9. Laws, Rules, & Regulations: All patients are expected to follow all Federal and State Laws, Statutes, Rules, & Regulations. Ignorance of the Laws does not constitute a valid excuse. The use of any illegal substances is prohibited. 10. Adopted CDC guidelines & recommendations: Target dosing levels will be at or below 60 MME/day. Use of benzodiazepines** is not recommended.  Exceptions: There are only two exceptions to the rule of not receiving pain medications from other Healthcare Providers. 1. Exception #1 (Emergencies): In the event of an emergency (i.e.: accident requiring emergency care), you are allowed to receive additional pain medication. However, you are responsible for: As soon as you are able, call our office [phone], at any time of the day or night, and leave a message stating your name, the  date and nature of the emergency, and the name and dose of the medication   prescribed. In the event that your call is answered by a member of our staff, make sure to document and save the date, time, and the name of the person that took your information.  2. Exception #2 (Planned Surgery): In the event that you are scheduled by another doctor or dentist to have any type of surgery or procedure, you are allowed (for a period no longer than 30 days), to receive additional pain medication, for the acute post-op pain. However, in this case, you are responsible for picking up a copy of our "Post-op Pain Management for Surgeons" handout, and giving it to your surgeon or dentist. This document is available at our office, and does not require an appointment to obtain it. Simply go to our office during business hours (Monday-Thursday from 8:00 AM to 4:00 PM) (Friday 8:00 AM to 12:00 Noon) or if you have a scheduled appointment with us, prior to your surgery, and ask for it by name. In addition, you will need to provide us with your name, name of your surgeon, type of surgery, and date of procedure or surgery.  *Opioid medications include: morphine, codeine, oxycodone, oxymorphone, hydrocodone, hydromorphone, meperidine, tramadol, tapentadol, buprenorphine, fentanyl, methadone. **Benzodiazepine medications include: diazepam (Valium), alprazolam (Xanax), clonazepam (Klonopine), lorazepam (Ativan), clorazepate (Tranxene), chlordiazepoxide (Librium), estazolam (Prosom), oxazepam (Serax), temazepam (Restoril), triazolam (Halcion) (Last updated: 05/08/2017) ____________________________________________________________________________________________   ____________________________________________________________________________________________  Pain Scale  Introduction: The pain score used by this practice is the Verbal Numerical Rating Scale (VNRS-11). This is an 11-point scale. It is for adults and children 10 years or  older. There are significant differences in how the pain score is reported, used, and applied. Forget everything you learned in the past and learn this scoring system.  General Information: The scale should reflect your current level of pain. Unless you are specifically asked for the level of your worst pain, or your average pain. If you are asked for one of these two, then it should be understood that it is over the past 24 hours.  Basic Activities of Daily Living (ADL): Personal hygiene, dressing, eating, transferring, and using restroom.  Instructions: Most patients tend to report their level of pain as a combination of two factors, their physical pain and their psychosocial pain. This last one is also known as "suffering" and it is reflection of how physical pain affects you socially and psychologically. From now on, report them separately. From this point on, when asked to report your pain level, report only your physical pain. Use the following table for reference.  Pain Clinic Pain Levels (0-5/10)  Pain Level Score  Description  No Pain 0   Mild pain 1 Nagging, annoying, but does not interfere with basic activities of daily living (ADL). Patients are able to eat, bathe, get dressed, toileting (being able to get on and off the toilet and perform personal hygiene functions), transfer (move in and out of bed or a chair without assistance), and maintain continence (able to control bladder and bowel functions). Blood pressure and heart rate are unaffected. A normal heart rate for a healthy adult ranges from 60 to 100 bpm (beats per minute).   Mild to moderate pain 2 Noticeable and distracting. Impossible to hide from other people. More frequent flare-ups. Still possible to adapt and function close to normal. It can be very annoying and may have occasional stronger flare-ups. With discipline, patients may get used to it and adapt.   Moderate pain 3 Interferes significantly with activities of daily  living (ADL).   It becomes difficult to feed, bathe, get dressed, get on and off the toilet or to perform personal hygiene functions. Difficult to get in and out of bed or a chair without assistance. Very distracting. With effort, it can be ignored when deeply involved in activities.   Moderately severe pain 4 Impossible to ignore for more than a few minutes. With effort, patients may still be able to manage work or participate in some social activities. Very difficult to concentrate. Signs of autonomic nervous system discharge are evident: dilated pupils (mydriasis); mild sweating (diaphoresis); sleep interference. Heart rate becomes elevated (>115 bpm). Diastolic blood pressure (lower number) rises above 100 mmHg. Patients find relief in laying down and not moving.   Severe pain 5 Intense and extremely unpleasant. Associated with frowning face and frequent crying. Pain overwhelms the senses.  Ability to do any activity or maintain social relationships becomes significantly limited. Conversation becomes difficult. Pacing back and forth is common, as getting into a comfortable position is nearly impossible. Pain wakes you up from deep sleep. Physical signs will be obvious: pupillary dilation; increased sweating; goosebumps; brisk reflexes; cold, clammy hands and feet; nausea, vomiting or dry heaves; loss of appetite; significant sleep disturbance with inability to fall asleep or to remain asleep. When persistent, significant weight loss is observed due to the complete loss of appetite and sleep deprivation.  Blood pressure and heart rate becomes significantly elevated. Caution: If elevated blood pressure triggers a pounding headache associated with blurred vision, then the patient should immediately seek attention at an urgent or emergency care unit, as these may be signs of an impending stroke.    Emergency Department Pain Levels (6-10/10)  Emergency Room Pain 6 Severely limiting. Requires emergency care  and should not be seen or managed at an outpatient pain management facility. Communication becomes difficult and requires great effort. Assistance to reach the emergency department may be required. Facial flushing and profuse sweating along with potentially dangerous increases in heart rate and blood pressure will be evident.   Distressing pain 7 Self-care is very difficult. Assistance is required to transport, or use restroom. Assistance to reach the emergency department will be required. Tasks requiring coordination, such as bathing and getting dressed become very difficult.   Disabling pain 8 Self-care is no longer possible. At this level, pain is disabling. The individual is unable to do even the most "basic" activities such as walking, eating, bathing, dressing, transferring to a bed, or toileting. Fine motor skills are lost. It is difficult to think clearly.   Incapacitating pain 9 Pain becomes incapacitating. Thought processing is no longer possible. Difficult to remember your own name. Control of movement and coordination are lost.   The worst pain imaginable 10 At this level, most patients pass out from pain. When this level is reached, collapse of the autonomic nervous system occurs, leading to a sudden drop in blood pressure and heart rate. This in turn results in a temporary and dramatic drop in blood flow to the brain, leading to a loss of consciousness. Fainting is one of the body's self defense mechanisms. Passing out puts the brain in a calmed state and causes it to shut down for a while, in order to begin the healing process.    Summary: 1. Refer to this scale when providing us with your pain level. 2. Be accurate and careful when reporting your pain level. This will help with your care. 3. Over-reporting your pain level will lead to loss of credibility. 4. Even   a level of 1/10 means that there is pain and will be treated at our facility. 5. High, inaccurate reporting will be  documented as "Symptom Exaggeration", leading to loss of credibility and suspicions of possible secondary gains such as obtaining more narcotics, or wanting to appear disabled, for fraudulent reasons. 6. Only pain levels of 5 or below will be seen at our facility. 7. Pain levels of 6 and above will be sent to the Emergency Department and the appointment cancelled. ____________________________________________________________________________________________   Prescriptions for Gabapentin and Flexeril were sent to your pharmacy. You were given one prescription for Tramadol today.

## 2017-06-19 NOTE — Progress Notes (Signed)
Patient's Name: Mark [name]  MRN: [id number]  Referring Provider: Boykin Nearing, MD  DOB: November 02, 1965  PCP: Ladell Pier, MD  DOS: 06/19/2017  Note by: Vevelyn Francois NP  Service setting: Ambulatory outpatient  Specialty: Interventional Pain Management  Location: [facility] (AMB) Pain Management Facility    Patient type: Established    Primary Reason(s) for Visit: Encounter for prescription drug management. (Level of risk: moderate)  CC: Back Pain (lower) and Neck Pain  HPI  Mark [name] is a 52 y.o. year old, male patient, who comes today for a medication management evaluation. 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 ([name]); Alcohol intoxication ([name]); Chronic low back pain (Secondary source of pain) (Bilateral) (R>L); Chronic pain syndrome; Paroxysmal VT ([name]); Opioid dependence ([name]); Long Q-T syndrome; Hypokalemia; Alcohol use disorder, severe, dependence ([name]); Chronic shoulder pain (Right); Chronic radicular low back pain [name] source of pain) (Right) (to calf); Chronic fatigue; Vitamin D insufficiency; Poor dentition; Chronic maxillary sinusitis; Erectile dysfunction; Chronic anxiety; GERD (gastroesophageal reflux disease); GAD (generalized anxiety disorder); Alcohol use disorder, severe, in early remission ([name]); Long term current use of opiate analgesic; Long term prescription opiate use; Chronic neck pain (Bilateral)(R>L); Cervical fusion syndrome; Failed back surgical syndrome; Chronic knee pain  (Bilateral) (R>L); Major depressive disorder, recurrent episode, moderate ([name]); Musculoskeletal pain; 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 His primarily concern today is the Back Pain (lower) and Neck Pain  Pain Assessment: Location: Lower Back Radiating: both hips, legs, feet Onset: More than a month ago Quality: Constant Severity: 8 /10 (self-reported pain score)  Note: Reported level is compatible with observation. Clinically the patient looks like a 2/10 A 2/10 is viewed as "Mild to Moderate" and described as noticeable and distracting. Impossible to hide from other people. More frequent flare-ups. Still possible to adapt and function close to normal. It can be very annoying and may have occasional stronger flare-ups. With discipline, patients may get used to it and adapt.       When using our objective Pain Scale, levels between 6 and 10/10 are said to belong in an emergency room, as it progressively worsens from a 6/10, described as severely limiting, requiring emergency care not usually available at an outpatient pain management facility. At a 6/10 level, communication becomes difficult and requires great effort. Assistance to reach the emergency department may be required. Facial flushing and profuse sweating along with potentially dangerous increases in heart rate and blood pressure will be evident.   Timing: Constant Modifying factors: medication, sitting still  Mark [name] was last scheduled for an appointment on 03/25/2017 for medication management. During today's appointment we reviewed Mark [name] chronic pain status, as well as his outpatient medication regimen. He states that his hips are really giving him problems. He states that his leg pain is now all over. He has PN so his leg pain is constant.  He admits that he has daily headaches. He admits that he has constant popping and grinding of his neck. He is SP ACDF. He states that  he has some loose hardware in his neck. Marland Kitchen He denies any middle back pain. He admits that he had no  quality of life. He states that he is just suffering an in misery.   The patient  reports that he does not use drugs. His body mass index is 28.89 kg/m.  Further details on both, my assessment(s), as well as the proposed treatment plan, please see below.  Controlled Substance Pharmacotherapy Assessment REMS (Risk Evaluation and Mitigation Strategy)  Analgesic: Tramadol 100 mg 4 times a day (400 mg/dayof tramadol)  MME/day:27m/day.  .Mark [name] DMelbourne Abts RN  06/19/2017  9:17 AM  Sign at close encounter Nursing Pain Medication Assessment:  Safety precautions to be maintained throughout the outpatient stay will include: orient to surroundings, keep bed in low position, maintain call bell within reach at all times, provide assistance with transfer out of bed and ambulation.  Medication Inspection Compliance: Pill count conducted under aseptic conditions, in front of the patient. Neither the pills nor the bottle was removed from the patient's sight at any time. Once count was completed pills were immediately returned to the patient in their original bottle.  Medication: Tramadol (Ultram) Pill/Patch Count: 0 of 240 pills remain Pill/Patch Appearance: Markings consistent with prescribed medication Bottle Appearance: Standard pharmacy container. Clearly labeled. Filled Date: 03/13 / 2019 Last Medication intake:  Yesterday   Pharmacokinetics: Liberation and absorption (onset of action): WNL Distribution (time to peak effect): WNL Metabolism and excretion (duration of action): WNL         Pharmacodynamics: Desired effects: Analgesia: Mr. MAyarsreports >50% benefit. Functional ability: Patient reports that medication allows him to accomplish basic ADLs Clinically meaningful improvement in function (CMIF): Sustained CMIF goals met Perceived effectiveness: Described as relatively effective,  allowing for increase in activities of daily living (ADL) Undesirable effects: Side-effects or Adverse reactions: None reported Monitoring: [place] PMP: Online review of the past 171-montheriod conducted. Compliant with practice rules and regulations Last UDS on record: Summary  Date Value Ref Range Status  09/16/2016 FINAL  Final    Comment:    ==================================================================== TOXASSURE SELECT 13 (MW) ==================================================================== Test                             Result       Flag       Units Drug Absent but Declared for Prescription Verification   Tramadol                       Not Detected UNEXPECTED ==================================================================== Test                      Result    Flag   Units      Ref Range   Creatinine              309              mg/dL      >=20 ==================================================================== Declared Medications:  The flagging and interpretation on this report are based on the  following declared medications.  Unexpected results may arise from  inaccuracies in the declared medications.  **Note: The testing scope of this panel includes these medications:  Tramadol  **Note: The testing scope of this panel does not include following  reported medications:  Albuterol (Ventolin HFA)  Baclofen  Cyclobenzaprine  Gabapentin  Losartan (Cozaar)  Metoprolol ==================================================================== For clinical consultation, please call ([phone]====================================================================    UDS interpretation: Non-Compliant Today I reminded Mr. MaNovingerhat accuracy during medication reconciliation is of paramount  importance. Medication Assessment Form: Reviewed. Patient indicates being compliant with therapy Treatment compliance: Deficiencies noted and steps taken to remind the patient of  the seriousness of adequate therapy compliance Risk Assessment Profile: Aberrant behavior: See prior evaluations. None observed or detected today Comorbid factors increasing risk of overdose: See prior notes. No additional risks detected today Risk of substance use disorder (SUD): Low  ORT Scoring interpretation table:  Score <3 = Low Risk for SUD  Score between 4-7 = Moderate Risk for SUD  Score >8 = High Risk for Opioid Abuse   Risk Mitigation Strategies:  Patient Counseling: Covered Patient-Prescriber Agreement (PPA): Present and active  Notification to other healthcare providers: Done  Pharmacologic Plan: No change in therapy, at this time.             Laboratory Chemistry  Inflammation Markers (CRP: Acute Phase) (ESR: Chronic Phase) Lab Results  Component Value Date   CRP 0.9 03/18/2016   ESRSEDRATE 2 03/18/2016   LATICACIDVEN 1.16 02/06/2015                         Rheumatology Markers Lab Results  Component Value Date   LABURIC 5.7 12/09/2011                        Renal Function Markers Lab Results  Component Value Date   BUN 12 04/09/2017   CREATININE 1.00 04/09/2017   GFRAA 100 04/09/2017   GFRNONAA 87 04/09/2017                              Hepatic Function Markers Lab Results  Component Value Date   AST 16 04/09/2017   ALT 22 04/09/2017   ALBUMIN 4.6 04/09/2017   ALKPHOS 115 04/09/2017   HCVAB NEGATIVE 06/14/2012   AMYLASE 26 12/28/2012   LIPASE 32 05/15/2016   AMMONIA 27 06/27/2010                        Electrolytes Lab Results  Component Value Date   NA 139 04/09/2017   K 4.6 04/09/2017   CL 97 04/09/2017   CALCIUM 9.9 04/09/2017   MG 2.1 03/18/2016   PHOS 4.1 06/25/2012                        Neuropathy Markers Lab Results  Component Value Date   VITAMINB12 244 03/18/2016   FOLATE 3.0 (L) 06/07/2013   HGBA1C 6.0 04/03/2015   HIV NON REACTIVE 06/12/2012                        Bone Pathology Markers Lab Results  Component  Value Date   VD25OH 21 (L) 07/04/2014   25OHVITD1 35 03/18/2016   25OHVITD2 1.0 03/18/2016   25OHVITD3 34 03/18/2016   TESTOFREE 78.9 09/16/2014   TESTOSTERONE 365 09/16/2014                         Coagulation Parameters Lab Results  Component Value Date   INR 1.04 06/07/2013   LABPROT 13.4 06/07/2013   PLT 293 04/09/2017   DDIMER <0.27 11/13/2015                        Cardiovascular Markers Lab Results  Component Value Date  BNP 23.9 02/27/2015   CKTOTAL 201 01/22/2012   CKMB 2.5 01/22/2012   TROPONINI <0.03 11/07/2015   HGB 16.4 04/09/2017   HCT 45.4 04/09/2017                         CA Markers No results found for: CEA, CA125, LABCA2                      Note: Lab results reviewed.  Recent Diagnostic Imaging Results  MR LUMBAR SPINE WO CONTRAST CLINICAL DATA:  Chronic bilateral low back pain with bilateral sciatica. Neuropathy. Prior back surgery.  EXAM: MRI LUMBAR SPINE WITHOUT CONTRAST  TECHNIQUE: Multiplanar, multisequence MR imaging of the lumbar spine was performed. No intravenous contrast was administered.  COMPARISON:  Lumbar MRI 04/24/2016  FINDINGS: Segmentation:  Normal  Alignment: Mild retrolisthesis L4-5 unchanged. Slight retrolisthesis L2-3 and L3-4 unchanged.  Vertebrae:  Normal bone marrow.  Negative for fracture or mass.  Conus medullaris and cauda equina: Conus extends to the L2 level. Conus and cauda equina appear normal.  Paraspinal and other soft tissues: Negative for mass or adenopathy or fluid collection.  Disc levels:  L1-2: Mild disc degeneration  L2-3: Mild retrolisthesis with mild facet degeneration. Negative for stenosis  L3-4: Mild retrolisthesis with mild facet degeneration. Negative for stenosis  L4-5: Mild retrolisthesis. Mild disc bulging and spurring. Mild facet hypertrophy. Mild narrowing of the canal similar to the prior study. Mild subarticular stenosis bilaterally  L5-S1: Bilateral threaded  interbody cage fusion. Posterior decompression. Negative for stenosis. No change from the prior study.  IMPRESSION: No change from the prior MRI 04/24/2016  Mild disc and facet degeneration L2-3, L3-4, L4-5. Mild narrowing of the spinal canal at L4-5 and mild subarticular stenosis bilaterally  Bilateral threaded interbody cage fusion L5-S1 unchanged, without stenosis.  Electronically Signed   By: Franchot Gallo M.D.   On: 05/23/2017 07:15  Complexity Note: Imaging results reviewed. Results shared with Mr. Kicklighter, using Layman's terms.                         Meds   Current Outpatient Medications:  .  albuterol (VENTOLIN HFA) 108 (90 Base) MCG/ACT inhaler, Inhale 2 puffs into the lungs every 6 (six) hours as needed for wheezing or shortness of breath., Disp: 18 g, Rfl: 3 .  atorvastatin (LIPITOR) 20 MG tablet, Take 1 tablet (20 mg total) by mouth daily., Disp: 30 tablet, Rfl: 3 .  CIALIS 5 MG tablet, TAKE 1 TABLET BY MOUTH DAILY., Disp: 30 tablet, Rfl: 3 .  cyclobenzaprine (FLEXERIL) 10 MG tablet, Take 1 tablet (10 mg total) by mouth at bedtime., Disp: 30 tablet, Rfl: 2 .  gabapentin (NEURONTIN) 800 MG tablet, Take 1 tablet (800 mg total) by mouth 4 (four) times daily., Disp: 120 tablet, Rfl: 2 .  losartan (COZAAR) 25 MG tablet, Take 25 mg daily by mouth., Disp: , Rfl:  .  metoprolol (LOPRESSOR) 50 MG tablet, Take 1 tablet (50 mg total) by mouth 2 (two) times daily., Disp: 60 tablet, Rfl: 11 .  tamsulosin (FLOMAX) 0.4 MG CAPS capsule, Take 1 capsule (0.4 mg total) by mouth daily after supper., Disp: 30 capsule, Rfl: 3 .  traMADol (ULTRAM) 50 MG tablet, Take 2 tablets (100 mg total) by mouth 4 (four) times daily. 1-2 tablets, Disp: 240 tablet, Rfl: 2 .  traZODone (DESYREL) 100 MG tablet, Take 100 mg by mouth at  bedtime as needed., Disp: , Rfl: 1  ROS  Constitutional: Denies any fever or chills Gastrointestinal: No reported hemesis, hematochezia, vomiting, or acute GI  distress Musculoskeletal: Denies any acute onset joint swelling, redness, loss of ROM, or weakness Neurological: No reported episodes of acute onset apraxia, aphasia, dysarthria, agnosia, amnesia, paralysis, loss of coordination, or loss of consciousness  Allergies  Mr. Cork is allergic to cozaar [losartan] and lisinopril.  [place]  Drug: Mr. Jarosz  reports that he does not use drugs. Alcohol:  reports that he does not drink alcohol. Tobacco:  reports that he has been smoking cigarettes.  He has a 15.00 pack-year smoking history. He has never used smokeless tobacco. Medical:  has a past medical history of Alcohol withdrawal ([place]), Allergy, Anxiety (Dx 2014), COPD (chronic obstructive pulmonary disease) ([place]), Drug abuse ([place]), Enlarged prostate, ETOH abuse, GERD (gastroesophageal reflux disease) (Dx 2003), Headache(784.0), Hepatitis, Hyperlipidemia (Dx 2000), Hypertension (Dx 2011), IBS (irritable bowel syndrome), Irregular heart beat, Long Q-T syndrome (01/23/2014), Mental disorder, Neuromuscular disorder ([place]), Neuropathy (06/07/2013), Seizure due to alcohol withdrawal ([place]) (01/23/2014), Shortness of breath, and Withdrawal seizures ([place]) (2014). Surgical: Mr. Larouche  has a past surgical history that includes Back surgery (1995, 1999); Neck surgery (2000); Nasal sinus surgery; left heart catheterization with coronary angiogram (N/A, 01/13/2014); Subacromial decompression (Right, 11/16/2014); Nissen fundoplication; 24 hour ph study (N/A, 01/16/2015); Upper gastrointestinal endoscopy; and Colonoscopy (1998). Family: family history includes Alcohol abuse in his father and paternal grandfather; Breast cancer in his paternal grandmother; Colon polyps in his mother; Hypertension in his mother; Lung cancer in his father.  Constitutional Exam  General appearance: Well nourished, well developed, and well hydrated. In no apparent acute distress Vitals:   06/19/17 0909  BP: (!) 155/101  Pulse: 73  Resp:  16  Temp: 97.8 F (36.6 C)  TempSrc: Oral  SpO2: 98%  Weight: 213 lb (96.6 kg)  Height: 6' (1.829 m)  HC: 4" (10.2 cm)  Psych/Mental status: Alert, oriented x 3 (person, place, & time)       Eyes: PERLA Respiratory: No evidence of acute respiratory distress  Cervical Spine Area Exam  Skin & Axial Inspection: Well healed scar from previous spine surgery detected Alignment: Asymmetric Functional ROM: Decreased ROM      Stability: No instability detected Muscle Tone/Strength: Functionally intact. No obvious neuro-muscular anomalies detected. Sensory (Neurological): Unimpaired Palpation: Complains of area being tender to palpation Negative cervical compression test         Upper Extremity (UE) Exam    Side: Right upper extremity  Side: Left upper extremity  Skin & Extremity Inspection: Skin color, temperature, and hair growth are WNL. No peripheral edema or cyanosis. No masses, redness, swelling, asymmetry, or associated skin lesions. No contractures.  Skin & Extremity Inspection: Skin color, temperature, and hair growth are WNL. No peripheral edema or cyanosis. No masses, redness, swelling, asymmetry, or associated skin lesions. No contractures.  Functional ROM: Unrestricted ROM          Functional ROM: Unrestricted ROM          Muscle Tone/Strength: Functionally intact. No obvious neuro-muscular anomalies detected.  Muscle Tone/Strength: Functionally intact. No obvious neuro-muscular anomalies detected.  Sensory (Neurological): Unimpaired          Sensory (Neurological): Unimpaired          Palpation: No palpable anomalies              Palpation: No palpable anomalies  Specialized Test(s): Deferred         Specialized Test(s): Deferred          Thoracic Spine Area Exam  Skin & Axial Inspection: No masses, redness, or swelling Alignment: Symmetrical Functional ROM: Unrestricted ROM Stability: No instability detected Muscle Tone/Strength: Functionally intact. No obvious  neuro-muscular anomalies detected. Sensory (Neurological): Unimpaired Muscle strength & Tone: No palpable anomalies  Lumbar Spine Area Exam  Skin & Axial Inspection: Well healed scar from previous spine surgery detected Alignment: Symmetrical Functional ROM: Unrestricted ROM      Stability: No instability detected Muscle Tone/Strength: Functionally intact. No obvious neuro-muscular anomalies detected. Sensory (Neurological): Unimpaired Palpation: No palpable anomalies       Provocative Tests: Lumbar Hyperextension and rotation test: evaluation deferred today       Lumbar Lateral bending test: evaluation deferred today       Patrick's Maneuver: evaluation deferred today                    Gait & Posture Assessment  Ambulation: Unassisted Gait: Relatively normal for age and body habitus Posture: WNL   Lower Extremity Exam    Side: Right lower extremity  Side: Left lower extremity  Skin & Extremity Inspection: Skin color, temperature, and hair growth are WNL. No peripheral edema or cyanosis. No masses, redness, swelling, asymmetry, or associated skin lesions. No contractures.  Skin & Extremity Inspection: Skin color, temperature, and hair growth are WNL. No peripheral edema or cyanosis. No masses, redness, swelling, asymmetry, or associated skin lesions. No contractures.  Functional ROM: Unrestricted ROM          Functional ROM: Unrestricted ROM          Muscle Tone/Strength: Functionally intact. No obvious neuro-muscular anomalies detected.  Muscle Tone/Strength: Functionally intact. No obvious neuro-muscular anomalies detected.  Sensory (Neurological): Unimpaired  Sensory (Neurological): Unimpaired  Palpation: No palpable anomalies  Palpation: No palpable anomalies   Assessment  Primary Diagnosis & Pertinent Problem List: The primary encounter diagnosis was Chronic low back pain (Secondary source of pain) (Bilateral) (R>L). Diagnoses of Chronic radicular low back pain [facility] source  of pain) (Right) (to calf), Cervical fusion syndrome, Chronic neck pain, Chronic sacroiliac joint pain (Bilateral) (R>L), Neuropathy, Peripheral (Feet) ([name]) (Location of Primary Source of Pain) (Bilateral) (R>L), Chronic pain syndrome, Musculoskeletal pain, and Long term prescription opiate use were also pertinent to this visit.  Status Diagnosis  Persistent Persistent Worsening 1. Chronic low back pain (Secondary source of pain) (Bilateral) (R>L)   2. Chronic radicular low back pain [facility] source of pain) (Right) (to calf)   3. Cervical fusion syndrome   4. Chronic neck pain   5. Chronic sacroiliac joint pain (Bilateral) (R>L)   6. Neuropathy, Peripheral (Feet) ([name]) (Location of Primary Source of Pain) (Bilateral) (R>L)   7. Chronic pain syndrome   8. Musculoskeletal pain   9. Long term prescription opiate use     Problems updated and reviewed during this visit: No problems updated. Plan of Care  Pharmacotherapy (Medications Ordered): Meds ordered this encounter  Medications  . traMADol (ULTRAM) 50 MG tablet    Sig: Take 2 tablets (100 mg total) by mouth 4 (four) times daily. 1-2 tablets    Dispense:  240 tablet    Refill:  2    Fill one day early if pharmacy is closed on scheduled refill date. Do not fill until: 06/19/2017 To last until: 09/17/2017    Order Specific Question:   Supervising Provider  AnswerMilinda Pointer [[id number]]  . cyclobenzaprine (FLEXERIL) 10 MG tablet    Sig: Take 1 tablet (10 mg total) by mouth at bedtime.    Dispense:  30 tablet    Refill:  2    Do not place medication on "Automatic Refill". Fill one day early if pharmacy is closed on scheduled refill date.    Order Specific Question:   Supervising Provider    Answer:   Milinda Pointer [phone]  . gabapentin (NEURONTIN) 800 MG tablet    Sig: Take 1 tablet (800 mg total) by mouth 4 (four) times daily.    Dispense:  120 tablet    Refill:  2    Do not place medication on "Automatic Refill".  Fill one day early if pharmacy is closed on scheduled refill date.    Order Specific Question:   Supervising Provider    Answer:   Milinda Pointer [[id number]]   New Prescriptions   No medications on file   Medications administered today: [name] had no medications administered during this visit. Lab-work, procedure(s), and/or referral(s): Orders Placed This Encounter  Procedures  . ToxASSURE Select 13 (MW), Urine  . Ambulatory referral to Neurosurgery   Imaging and/or referral(s): AMB REFERRAL TO NEUROSURGERY  Interventional therapies: Planned, scheduled, and/or pending:  Not at this time   Considering:  Lower extremity EMG/PNCV. (Radiculopathy versus peripheral neuropathy) Upper extremity EMG/PNCV.  Possibleright-sided L3 + L4 Lumbarsympathetic RFA Possible Racz procedure Diagnostic cervical epidural steroidinjection  Diagnostic bilateral cervical facet block  Possible bilateral cervical facet RFA Diagnostic bilateral lumbar facet block Possible bilateral lumbar facet RFA Diagnostic right intra-articular shoulder joint injection Diagnostic right suprascapular nerveblock Possibleright suprascapular RFA Diagnostic bilateral intra-articular knee joint injection Possible bilateral series of 5 [place] knee injections Diagnostic bilateral Genicular nerve block Possible bilateral Genicular RFA Diagnostic bilateral greater occipital nerve block Possible bilateral greater occipital nerve RFA Diagnostic bilateral C2 +TON nerve block Possible bilateral C2 + TON RFA   Palliative PRN treatment(s):  Diagnostic Caudal ESI#3 Diagnostic right-sided L3 + L4 lumbar sympathetic block#3       Provider-requested follow-up: Return in about 3 months (around 09/18/2017) for MedMgmt with Me Donella Stade Edison Pace).  Future Appointments  Date Time Provider [place]  09/18/2017 11:30 AM Vevelyn Francois, NP [facility] None   Primary Care Physician: Ladell Pier, MD Location: [facility] Outpatient Pain Management Facility Note by: Vevelyn Francois NP Date: 06/19/2017; Time: 11:54 AM  Pain Score Disclaimer: We use the NRS-11 scale. This is a self-reported, subjective measurement of pain severity with only modest accuracy. It is used primarily to identify changes within a particular patient. It must be understood that outpatient pain scales are significantly less accurate that those used for research, where they can be applied under ideal controlled circumstances with minimal exposure to variables. In reality, the score is likely to be a combination of pain intensity and pain affect, where pain affect describes the degree of emotional arousal or changes in action readiness caused by the sensory experience of pain. Factors such as social and work situation, setting, emotional state, anxiety levels, expectation, and prior pain experience may influence pain perception and show large inter-individual differences that may also be affected by time variables.  Patient instructions provided during this appointment: Patient Instructions   ____________________________________________________________________________________________  Medication Rules  Applies to: All patients receiving prescriptions (written or electronic).  Pharmacy of record: Pharmacy where electronic prescriptions will be sent. If written prescriptions are taken to a  different pharmacy, please inform the nursing staff. The pharmacy listed in the electronic medical record should be the one where you would like electronic prescriptions to be sent.  Prescription refills: Only during scheduled appointments. Applies to both, written and electronic prescriptions.  NOTE: The following applies primarily to controlled substances (Opioid* Pain Medications).   Patient's responsibilities: 1. Pain Pills: Bring all pain pills to every appointment (except for procedure appointments). 2. Pill Bottles: Bring  pills in original pharmacy bottle. Always bring newest bottle. Bring bottle, even if empty. 3. Medication refills: You are responsible for knowing and keeping track of what medications you need refilled. The day before your appointment, write a list of all prescriptions that need to be refilled. Bring that list to your appointment and give it to the admitting nurse. Prescriptions will be written only during appointments. If you forget a medication, it will not be "Called in", "Faxed", or "electronically sent". You will need to get another appointment to get these prescribed. 4. Prescription Accuracy: You are responsible for carefully inspecting your prescriptions before leaving our office. Have the discharge nurse carefully go over each prescription with you, before taking them home. Make sure that your name is accurately spelled, that your address is correct. Check the name and dose of your medication to make sure it is accurate. Check the number of pills, and the written instructions to make sure they are clear and accurate. Make sure that you are given enough medication to last until your next medication refill appointment. 5. Taking Medication: Take medication as prescribed. Never take more pills than instructed. Never take medication more frequently than prescribed. Taking less pills or less frequently is permitted and encouraged, when it comes to controlled substances (written prescriptions).  6. Inform other Doctors: Always inform, all of your healthcare providers, of all the medications you take. 7. Pain Medication from other Providers: You are not allowed to accept any additional pain medication from any other Doctor or Healthcare provider. There are two exceptions to this rule. (see below) In the event that you require additional pain medication, you are responsible for notifying us, as stated below. 8. Medication Agreement: You are responsible for carefully reading and following our Medication  Agreement. This must be signed before receiving any prescriptions from our practice. Safely store a copy of your signed Agreement. Violations to the Agreement will result in no further prescriptions. (Additional copies of our Medication Agreement are available upon request.) 9. Laws, Rules, & Regulations: All patients are expected to follow all Federal and [employer], [employer], Rules, [employer]. Ignorance of the Laws does not constitute a valid excuse. The use of any illegal substances is prohibited. 10. Adopted CDC guidelines & recommendations: Target dosing levels will be at or below 60 MME/day. Use of benzodiazepines** is not recommended.  Exceptions: There are only two exceptions to the rule of not receiving pain medications from other Healthcare Providers. 1. Exception #1 (Emergencies): In the event of an emergency (i.e.: accident requiring emergency care), you are allowed to receive additional pain medication. However, you are responsible for: As soon as you are able, call our office [phone], at any time of the day or night, and leave a message stating your name, the date and nature of the emergency, and the name and dose of the medication prescribed. In the event that your call is answered by a member of our staff, make sure to document and save the date, time, and the name of the person that took  your information.  2. Exception #2 (Planned Surgery): In the event that you are scheduled by another doctor or dentist to have any type of surgery or procedure, you are allowed (for a period no longer than 30 days), to receive additional pain medication, for the acute post-op pain. However, in this case, you are responsible for picking up a copy of our "Post-op Pain Management for Surgeons" handout, and giving it to your surgeon or dentist. This document is available at our office, and does not require an appointment to obtain it. Simply go to our office during business hours (Monday-Thursday from  8:00 AM to 4:00 PM) (Friday 8:00 AM to 12:00 Noon) or if you have a scheduled appointment with [place], prior to your surgery, and ask for it by name. In addition, you will need to provide [place] with your name, name of your surgeon, type of surgery, and date of procedure or surgery.  *Opioid medications include: morphine, codeine, oxycodone, oxymorphone, hydrocodone, hydromorphone, meperidine, tramadol, tapentadol, buprenorphine, fentanyl, methadone. **Benzodiazepine medications include: diazepam (Valium), alprazolam (Xanax), clonazepam (Klonopine), lorazepam (Ativan), clorazepate (Tranxene), chlordiazepoxide (Librium), estazolam (Prosom), oxazepam (Serax), temazepam (Restoril), triazolam (Halcion) (Last updated: 05/08/2017) ____________________________________________________________________________________________   ____________________________________________________________________________________________  Pain Scale  Introduction: The pain score used by this practice is the Verbal Numerical Rating Scale (VNRS-11). This is an 11-point scale. It is for adults and children 10 years or older. There are significant differences in how the pain score is reported, used, and applied. Forget everything you learned in the past and learn this scoring system.  General Information: The scale should reflect your current level of pain. Unless you are specifically asked for the level of your worst pain, or your average pain. If you are asked for one of these two, then it should be understood that it is over the past 24 hours.  Basic Activities of Daily Living (ADL): Personal hygiene, dressing, eating, transferring, and using restroom.  Instructions: Most patients tend to report their level of pain as a combination of two factors, their physical pain and their psychosocial pain. This last one is also known as "suffering" and it is reflection of how physical pain affects you socially and psychologically. From now on,  report them separately. From this point on, when asked to report your pain level, report only your physical pain. Use the following table for reference.  Pain Clinic Pain Levels (0-5/10)  Pain Level Score  Description  No Pain 0   Mild pain 1 Nagging, annoying, but does not interfere with basic activities of daily living (ADL). Patients are able to eat, bathe, get dressed, toileting (being able to get on and off the toilet and perform personal hygiene functions), transfer (move in and out of bed or a chair without assistance), and maintain continence (able to control bladder and bowel functions). Blood pressure and heart rate are unaffected. A normal heart rate for a healthy adult ranges from 60 to 100 bpm (beats per minute).   Mild to moderate pain 2 Noticeable and distracting. Impossible to hide from other people. More frequent flare-ups. Still possible to adapt and function close to normal. It can be very annoying and may have occasional stronger flare-ups. With discipline, patients may get used to it and adapt.   Moderate pain 3 Interferes significantly with activities of daily living (ADL). It becomes difficult to feed, bathe, get dressed, get on and off the toilet or to perform personal hygiene functions. Difficult to get in and out of bed or a chair without  assistance. Very distracting. With effort, it can be ignored when deeply involved in activities.   Moderately severe pain 4 Impossible to ignore for more than a few minutes. With effort, patients may still be able to manage work or participate in some social activities. Very difficult to concentrate. Signs of autonomic nervous system discharge are evident: dilated pupils (mydriasis); mild sweating (diaphoresis); sleep interference. Heart rate becomes elevated (>115 bpm). Diastolic blood pressure (lower number) rises above 100 mmHg. Patients find relief in laying down and not moving.   Severe pain 5 Intense and extremely unpleasant. Associated  with frowning face and frequent crying. Pain overwhelms the senses.  Ability to do any activity or maintain social relationships becomes significantly limited. Conversation becomes difficult. Pacing back and forth is common, as getting into a comfortable position is nearly impossible. Pain wakes you up from deep sleep. Physical signs will be obvious: pupillary dilation; increased sweating; goosebumps; brisk reflexes; cold, clammy hands and feet; nausea, vomiting or dry heaves; loss of appetite; significant sleep disturbance with inability to fall asleep or to remain asleep. When persistent, significant weight loss is observed due to the complete loss of appetite and sleep deprivation.  Blood pressure and heart rate becomes significantly elevated. Caution: If elevated blood pressure triggers a pounding headache associated with blurred vision, then the patient should immediately seek attention at an urgent or emergency care unit, as these may be signs of an impending stroke.    Emergency Department Pain Levels (6-10/10)  Emergency Room Pain 6 Severely limiting. Requires emergency care and should not be seen or managed at an outpatient pain management facility. Communication becomes difficult and requires great effort. Assistance to reach the emergency department may be required. Facial flushing and profuse sweating along with potentially dangerous increases in heart rate and blood pressure will be evident.   Distressing pain 7 Self-care is very difficult. Assistance is required to transport, or use restroom. Assistance to reach the emergency department will be required. Tasks requiring coordination, such as bathing and getting dressed become very difficult.   Disabling pain 8 Self-care is no longer possible. At this level, pain is disabling. The individual is unable to do even the most "basic" activities such as walking, eating, bathing, dressing, transferring to a bed, or toileting. Fine motor skills are  lost. It is difficult to think clearly.   Incapacitating pain 9 Pain becomes incapacitating. Thought processing is no longer possible. Difficult to remember your own name. Control of movement and coordination are lost.   The worst pain imaginable 10 At this level, most patients pass out from pain. When this level is reached, collapse of the autonomic nervous system occurs, leading to a sudden drop in blood pressure and heart rate. This in turn results in a temporary and dramatic drop in blood flow to the brain, leading to a loss of consciousness. Fainting is one of the body's self defense mechanisms. Passing out puts the brain in a calmed state and causes it to shut down for a while, in order to begin the healing process.    Summary: 1. Refer to this scale when providing [place] with your pain level. 2. Be accurate and careful when reporting your pain level. This will help with your care. 3. Over-reporting your pain level will lead to loss of credibility. 4. Even a level of 1/10 means that there is pain and will be treated at our facility. 5. High, inaccurate reporting will be documented as "Symptom Exaggeration", leading to loss of credibility  and suspicions of possible secondary gains such as obtaining more narcotics, or wanting to appear disabled, for fraudulent reasons. 6. Only pain levels of 5 or below will be seen at our facility. 7. Pain levels of 6 and above will be sent to the Emergency Department and the appointment cancelled. ____________________________________________________________________________________________   Prescriptions for Gabapentin and Flexeril were sent to your pharmacy. You were given one prescription for Tramadol today.

## 2017-06-19 NOTE — Progress Notes (Signed)
Nursing Pain Medication Assessment:  Safety precautions to be maintained throughout the outpatient stay will include: orient to surroundings, keep bed in low position, maintain call bell within reach at all times, provide assistance with transfer out of bed and ambulation.  Medication Inspection Compliance: Pill count conducted under aseptic conditions, in front of the patient. Neither the pills nor the bottle was removed from the patient's sight at any time. Once count was completed pills were immediately returned to the patient in their original bottle.  Medication: Tramadol (Ultram) Pill/Patch Count: 0 of 240 pills remain Pill/Patch Appearance: Markings consistent with prescribed medication Bottle Appearance: Standard pharmacy container. Clearly labeled. Filled Date: 03/13 / 2019 Last Medication intake:  Yesterday

## 2017-06-23 ENCOUNTER — Encounter: Payer: Self-pay | Admitting: Nurse Practitioner

## 2017-06-25 LAB — TOXASSURE SELECT 13 (MW), URINE

## 2017-07-14 MED FILL — GABAPENTIN 800 MG TABLET: 800 | 30 days supply | Qty: 120 | Fill #1

## 2017-07-14 MED FILL — ?METOPROLOL 50 MG TABLET: 50 | 30 days supply | Qty: 90 | Fill #1

## 2017-07-14 MED FILL — CYCLOBENZAPRINE 10 MG TAB: 10 | 30 days supply | Qty: 30 | Fill #0

## 2017-07-14 MED FILL — !CIALIS 5 MG TABLET: 5 | 30 days supply | Qty: 30 | Fill #1

## 2017-07-15 MED FILL — LOSARTAN POTASSIUM 50 MG TA: 50 | 30 days supply | Qty: 30 | Fill #0

## 2017-07-17 MED FILL — traMADol HCL 50 MG TABS: 50 | 30 days supply | Qty: 240 | Fill #1

## 2017-08-01 ENCOUNTER — Ambulatory Visit: Payer: Self-pay | Attending: Internal Medicine

## 2017-08-12 ENCOUNTER — Encounter: Payer: Self-pay | Admitting: Internal Medicine

## 2017-08-12 ENCOUNTER — Ambulatory Visit: Payer: Self-pay | Attending: Internal Medicine | Admitting: Internal Medicine

## 2017-08-12 VITALS — BP 149/85 | HR 67 | Temp 98.2°F | Resp 16 | Wt 217.6 lb

## 2017-08-12 DIAGNOSIS — R062 Wheezing: Secondary | ICD-10-CM

## 2017-08-12 DIAGNOSIS — M545 Low back pain: Secondary | ICD-10-CM

## 2017-08-12 DIAGNOSIS — Z79899 Other long term (current) drug therapy: Secondary | ICD-10-CM | POA: Insufficient documentation

## 2017-08-12 DIAGNOSIS — F17210 Nicotine dependence, cigarettes, uncomplicated: Secondary | ICD-10-CM | POA: Insufficient documentation

## 2017-08-12 DIAGNOSIS — E785 Hyperlipidemia, unspecified: Secondary | ICD-10-CM | POA: Insufficient documentation

## 2017-08-12 DIAGNOSIS — F411 Generalized anxiety disorder: Secondary | ICD-10-CM | POA: Insufficient documentation

## 2017-08-12 DIAGNOSIS — Z79891 Long term (current) use of opiate analgesic: Secondary | ICD-10-CM | POA: Insufficient documentation

## 2017-08-12 DIAGNOSIS — G894 Chronic pain syndrome: Secondary | ICD-10-CM | POA: Insufficient documentation

## 2017-08-12 DIAGNOSIS — Z8249 Family history of ischemic heart disease and other diseases of the circulatory system: Secondary | ICD-10-CM | POA: Insufficient documentation

## 2017-08-12 DIAGNOSIS — K219 Gastro-esophageal reflux disease without esophagitis: Secondary | ICD-10-CM | POA: Insufficient documentation

## 2017-08-12 DIAGNOSIS — Z801 Family history of malignant neoplasm of trachea, bronchus and lung: Secondary | ICD-10-CM | POA: Insufficient documentation

## 2017-08-12 DIAGNOSIS — I10 Essential (primary) hypertension: Secondary | ICD-10-CM | POA: Insufficient documentation

## 2017-08-12 DIAGNOSIS — M5432 Sciatica, left side: Secondary | ICD-10-CM | POA: Insufficient documentation

## 2017-08-12 DIAGNOSIS — J40 Bronchitis, not specified as acute or chronic: Secondary | ICD-10-CM | POA: Insufficient documentation

## 2017-08-12 DIAGNOSIS — Z9889 Other specified postprocedural states: Secondary | ICD-10-CM | POA: Insufficient documentation

## 2017-08-12 DIAGNOSIS — M5412 Radiculopathy, cervical region: Secondary | ICD-10-CM | POA: Insufficient documentation

## 2017-08-12 DIAGNOSIS — Z888 Allergy status to other drugs, medicaments and biological substances status: Secondary | ICD-10-CM | POA: Insufficient documentation

## 2017-08-12 DIAGNOSIS — G8929 Other chronic pain: Secondary | ICD-10-CM

## 2017-08-12 DIAGNOSIS — F331 Major depressive disorder, recurrent, moderate: Secondary | ICD-10-CM | POA: Insufficient documentation

## 2017-08-12 MED ORDER — ACETAMINOPHEN-CODEINE #3 300-30 MG PO TABS: 1.0000 | ORAL_TABLET | Freq: Three times a day (TID) | ORAL | 0 refills | Status: DC | PRN

## 2017-08-12 MED ORDER — PREDNISONE 20 MG PO TABS: 20.0000 mg | ORAL_TABLET | Freq: Every day | ORAL | 0 refills | Status: DC

## 2017-08-12 MED ORDER — ALBUTEROL SULFATE HFA 108 (90 BASE) MCG/ACT IN AERS: 2.0000 | INHALATION_SPRAY | Freq: Four times a day (QID) | RESPIRATORY_TRACT | 3 refills | Status: DC | PRN

## 2017-08-12 MED FILL — !VENTOLIN HFA INHALER: 108 (90 BAS | 25 days supply | Qty: 18 | Fill #0

## 2017-08-12 MED FILL — predniSONE 20 MG TABS: 20 | 5 days supply | Qty: 5 | Fill #0

## 2017-08-12 MED FILL — ACETAMINOPHEN/COD #3 TABLET: 300-30 | 30 days supply | Qty: 90 | Fill #0

## 2017-08-12 NOTE — Progress Notes (Signed)
Patient ID: Mark [name], male    DOB: 21-Oct-1965  MRN: [id number]  CC: Medication Management   Subjective: Mark [name] is a 52 y.o. male who presents for chronic disease management. 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 C/o Cough productive of green phlegm, congestion, sore throat, body aches. Thinks he had low grade fever.  He has had sick contacts including his mother, and granddaughter.  Requesting a Z-Pak.  He is not taking any over-the-counter medications for his symptoms  Dilated CM:  Saw cardiologist about 1 mth ago.  No medication changes made.  Reports compliance with metoprolol and Cozaar  Chronic lower back pain and chronic neck pain: Patient is requesting not to go back to pain clinic at [facility].  He would like to be changed from tramadol to Tylenol 3's and follow here with [place] until he gets in to see a neurosurgeon.  He reports being on Tylenol 3's in the past with Dr. Adrian [name] before he was sent to the pain specialist.  He feels that the treatments he has received there have not helped his lower back.  He is currently on gabapentin and tramadol.  Referred to [facility] for chronic radicular neck pain by the pain specialist.  However the neurosurgeon does not take the orange card.  He had a disability hearing about 2 months ago and his lawyer is anticipating a favorable response fairly soon.  Once he is approved for disability he would be able to get Medicare and would be able to get into see the neurosurgeon.  -He has history of fusion surgery on the lower back and also on the neck.  Some of the screws in the neck have broken off and are sitting loose.  This was confirmed with x-ray of the cervical spine done 03/2016 that reveals C5 screws appearing fractured.  He was noted to have diffuse  degenerative changes. Pain in the neck is constant.  He has a hard time holding his head up straight for an extended period of time.  It feels more comfortable with his head somewhat flexed.  Endorses pain in the arms right greater than left and some numbness at the fingertips.  -Denies any street drug use.  Past history of EtOH abuse but is in remission.  Reports he had a few glasses of wine a few weeks ago while at dinner with his family.  Patient Active Problem List   Diagnosis Date Noted  . Opioid-induced hyperalgesia (with oxycodone) 02/11/2017  . Allodynia (Lower extremities) 12/26/2016  . Hyperalgesia (Lower extremities) 12/26/2016  . Sympathetic pain (lower extremity) 09/26/2016  . Lower extremity neuropathy (Primary Source of Pain) (Bilateral) (R>L) 09/26/2016  . Lumbar facet syndrome (Bilateral) (R>L) 08/22/2016  . Chronic sacroiliac joint pain (Bilateral) (R>L) 08/22/2016  . Chronic lower extremity pain (Bilateral) (R>L) 06/26/2016  . Opiate use 05/14/2016  . Occipital headache (Bilateral) (R>L) 04/16/2016  . Musculoskeletal pain 04/15/2016  . 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 Major depressive disorder, recurrent episode, moderate ([place]) 04/10/2016  . Long term current use of opiate analgesic 03/18/2016  . Long term prescription opiate use 03/18/2016  . Chronic neck pain (Bilateral)(R>L) 03/18/2016  . Cervical fusion syndrome 03/18/2016  . 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 Chronic knee pain  (Bilateral) (R>L) 03/18/2016  . GAD (generalized  anxiety disorder) 12/06/2015  . Alcohol use disorder, severe, in early remission ([place]) 12/06/2015  . GERD (gastroesophageal reflux disease) 07/24/2015  . Chronic anxiety 12/19/2014  . Erectile dysfunction 09/16/2014  . Poor dentition 07/28/2014  . Chronic maxillary sinusitis 07/28/2014  . Vitamin D insufficiency 07/05/2014  . Chronic fatigue 07/04/2014  . Chronic radicular low back pain [facility] source of  pain) (Right) (to calf) 06/02/2014  . Chronic shoulder pain (Right) 05/12/2014  . Alcohol use disorder, severe, dependence ([place])   . 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 Hypokalemia   . Opioid dependence ([place]) 01/22/2014    Class: Acute  . Paroxysmal VT ([place]) 12/19/2013  . Chronic low back pain (Secondary source of pain) (Bilateral) (R>L) 12/28/2012  . Chronic pain syndrome 12/28/2012  . Alcohol intoxication ([place]) 06/09/2012  . 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 Nonsustained ventricular tachycardia ([place]) 01/20/2012  . Tobacco abuse 01/19/2012  . Essential hypertension   . Hyperlipidemia      Current Outpatient Medications on File Prior to Visit  Medication Sig Dispense Refill  . atorvastatin (LIPITOR) 20 MG tablet Take 1 tablet (20 mg total) by mouth daily. 30 tablet 3  . CIALIS 5 MG tablet TAKE 1 TABLET BY MOUTH DAILY. 30 tablet 3  . cyclobenzaprine (FLEXERIL) 10 MG tablet Take 1 tablet (10 mg total) by mouth at bedtime. 30 tablet 2  . gabapentin (NEURONTIN) 800 MG tablet Take 1 tablet (800 mg total) by mouth 4 (four) times daily. 120 tablet 2  . losartan (COZAAR) 25 MG tablet Take 25 mg daily by mouth.    . metoprolol (LOPRESSOR) 50 MG tablet Take 1 tablet (50 mg total) by mouth 2 (two) times daily. 60 tablet 11  . tamsulosin (FLOMAX) 0.4 MG CAPS capsule Take 1 capsule (0.4 mg total) by mouth daily after supper. 30 capsule 3  . traMADol (ULTRAM) 50 MG tablet Take 2 tablets (100 mg total) by mouth 4 (four) times daily. 1-2 tablets 240 tablet 2  . traZODone (DESYREL) 100 MG tablet Take 100 mg by mouth at bedtime as needed.  1   No current facility-administered medications on file prior to visit.     Allergies  Allergen Reactions  . Cozaar [Losartan] Anaphylaxis  . Lisinopril Anaphylaxis    Social History   Socioeconomic History  . Marital status: Divorced    Spouse name: Not on file  . Number of children: 2  . Years of education: Not on file  .  Highest education level: Not on file  Occupational History  . Occupation: [employer]   Social Needs  . Financial resource strain: Not on file  . Food insecurity:    Worry: Not on file    Inability: Not on file  . Transportation needs:    Medical: Not on file    Non-medical: Not on file  Tobacco Use  . Smoking status: Current Every Day Smoker    Packs/day: 0.50    Years: 30.00    Pack years: 15.00    Types: Cigarettes  . Smokeless tobacco: Never Used  . Tobacco comment: 11-30-2014 info given, Has cut back from 2 pks to 1/2 pack and "working on it"  Substance and Sexual Activity  . Alcohol use: No    Alcohol/week: 0.0 oz    Comment: occ; binge drinking occasionally  . Drug use: No    Comment: Opana  . Sexual activity: Not Currently  Lifestyle  . Physical activity:    Days per week: Not  on file    Minutes per session: Not on file  . Stress: Not on file  Relationships  . Social connections:    Talks on phone: Not on file    Gets together: Not on file    Attends religious service: Not on file    Active member of club or organization: Not on file    Attends meetings of clubs or organizations: Not on file    Relationship status: Not on file  . Intimate partner violence:    Fear of current or ex partner: Not on file    Emotionally abused: Not on file    Physically abused: Not on file    Forced sexual activity: Not on file  Other Topics Concern  . Not on file  Social History Narrative  . Not on file    Family History  Problem Relation Age of Onset  . Lung cancer Father        was a smoker  . Alcohol abuse Father   . Hypertension Mother   . Colon polyps Mother   . Breast cancer Paternal Grandmother   . Alcohol abuse Paternal Grandfather   . Colon cancer Neg Hx   . Rectal cancer Neg Hx   . Stomach cancer Neg Hx     Past Surgical History:  Procedure Laterality Date  . [place] STUDY N/A 01/16/2015   Procedure: [place] STUDY;  Surgeon: Manus Gunning,  MD;  Location: [facility];  Service: Gastroenterology;  Laterality: N/A;  . [place]  . COLONOSCOPY  1998  . LEFT HEART CATHETERIZATION WITH CORONARY ANGIOGRAM N/A 01/13/2014   Procedure: LEFT HEART CATHETERIZATION WITH CORONARY ANGIOGRAM;  Surgeon: Clent Demark, MD;  Location: [facility];  Service: Cardiovascular;  Laterality: N/A;  . NASAL SINUS SURGERY    . NECK SURGERY  2000   cervial fusion   . [place] FUNDOPLICATION    . SUBACROMIAL DECOMPRESSION Right 11/16/2014   Procedure: RIGHT SHOULDER ARTHROSCOPY WITH SUBACROMIAL DECOMPRESSION ;  Surgeon: Leandrew Koyanagi, MD;  Location: [facility];  Service: Orthopedics;  Laterality: Right;  . UPPER GASTROINTESTINAL ENDOSCOPY      ROS: Review of Systems Negative except as stated above. PHYSICAL EXAM: BP (!) 149/85   Pulse 67   Temp 98.2 F (36.8 C) (Oral)   Resp 16   Wt 217 lb 9.6 oz (98.7 kg)   SpO2 97%   BMI 29.51 kg/m   Wt Readings from Last 3 Encounters:  08/12/17 217 lb 9.6 oz (98.7 kg)  06/19/17 213 lb (96.6 kg)  04/03/17 207 lb 12.8 oz (94.3 kg)  BP 138/88  Physical Exam  General appearance - alert, well appearing, and in no distress Mental status - normal mood, behavior, speech, dress, motor activity, and thought processes Neck -no tenderness on palpation of the cervical vertebrae Mouth-throat is clear Chest -some upper airway sounds scattered with wheezes. Heart - normal rate, regular rhythm, normal S1, S2, no murmurs, rubs, clicks or gallops Musculoskeletal -no tenderness on palpation of the lumbar spine.  Gait is normal. Extremities - peripheral pulses normal, no pedal edema, no clubbing or cyanosis   ASSESSMENT AND PLAN: 1. Cervical radiculopathy -Advised patient to discontinue tramadol. Will start Tylenol 3's 1 tab every 8 hours as needed.  Advised of possible side effects of the medications including drowsiness, constipation and dependence. [place] reviewed I went over controlled  substance prescribing agreement with him.  Patient has read and  signed. Baseline urine drug screen today. Once he gets Medicare or Medicaid will refer to neurosurgeon - acetaminophen-codeine (TYLENOL #3) 300-30 MG tablet; Take 1 tablet by mouth every 8 (eight) hours as needed for moderate pain.  Dispense: 90 tablet; Refill: 0 - acetaminophen-codeine (TYLENOL #3) 300-30 MG tablet; Take 1 tablet by mouth every 8 (eight) hours as needed for moderate pain.  Dispense: 90 tablet; Refill: 0 - [id number] 11+Oxyco+Alc+Crt-Bund  2. Chronic bilateral low back pain, with sciatica presence unspecified - acetaminophen-codeine (TYLENOL #3) 300-30 MG tablet; Take 1 tablet by mouth every 8 (eight) hours as needed for moderate pain.  Dispense: 90 tablet; Refill: 0 - acetaminophen-codeine (TYLENOL #3) 300-30 MG tablet; Take 1 tablet by mouth every 8 (eight) hours as needed for moderate pain.  Dispense: 90 tablet; Refill: 0  3. Bronchitis - albuterol (VENTOLIN HFA) 108 (90 Base) MCG/ACT inhaler; Inhale 2 puffs into the lungs every 6 (six) hours as needed for wheezing or shortness of breath.  Dispense: 18 g; Refill: 3 - predniSONE (DELTASONE) 20 MG tablet; Take 1 tablet (20 mg total) by mouth daily with breakfast.  Dispense: 5 tablet; Refill: 0  4. Essential hypertension Continue current medications    Patient was given the opportunity to ask questions.  Patient verbalized understanding of the plan and was able to repeat key elements of the plan.   Orders Placed This Encounter  Procedures  . [id number] 11+Oxyco+Alc+Crt-Bund     Requested Prescriptions   Signed Prescriptions Disp Refills  . acetaminophen-codeine (TYLENOL #3) 300-30 MG tablet 90 tablet 0    Sig: Take 1 tablet by mouth every 8 (eight) hours as needed for moderate pain.  Marland Kitchen acetaminophen-codeine (TYLENOL #3) 300-30 MG tablet 90 tablet 0    Sig: Take 1 tablet by mouth every 8 (eight) hours as needed for moderate pain.  Marland Kitchen albuterol (VENTOLIN HFA) 108  (90 Base) MCG/ACT inhaler 18 g 3    Sig: Inhale 2 puffs into the lungs every 6 (six) hours as needed for wheezing or shortness of breath.  . predniSONE (DELTASONE) 20 MG tablet 5 tablet 0    Sig: Take 1 tablet (20 mg total) by mouth daily with breakfast.    Return in about 3 months (around 11/12/2017).  Karle Plumber, MD, FACP

## 2017-08-12 NOTE — Patient Instructions (Signed)
Stop Tramadol. Start Tylenol #3 instead and take as directed.

## 2017-08-15 ENCOUNTER — Other Ambulatory Visit (HOSPITAL_COMMUNITY): Payer: Self-pay | Admitting: Psychiatry

## 2017-08-15 DIAGNOSIS — G629 Polyneuropathy, unspecified: Secondary | ICD-10-CM

## 2017-08-15 MED FILL — ?METOPROLOL 50 MG TABLET: 50 | 30 days supply | Qty: 90 | Fill #2

## 2017-08-15 MED FILL — $CIALIS 5 MG TABLET: 5 | 30 days supply | Qty: 30 | Fill #2

## 2017-08-18 ENCOUNTER — Other Ambulatory Visit (HOSPITAL_COMMUNITY): Payer: Self-pay | Admitting: Psychiatry

## 2017-08-18 DIAGNOSIS — G629 Polyneuropathy, unspecified: Secondary | ICD-10-CM

## 2017-08-19 LAB — DRUG SCREEN 764883 11+OXYCO+ALC+CRT-BUND
Amphetamines, Urine: NEGATIVE ng/mL
BARBITURATE: NEGATIVE ng/mL
CANNABINOID QUANT UR: NEGATIVE ng/mL
COCAINE (METABOLITE): NEGATIVE ng/mL
Creatinine: 102.4 mg/dL (ref 20.0–300.0)
ETHANOL: NEGATIVE %
Meperidine: NEGATIVE ng/mL
Methadone Screen, Urine: NEGATIVE ng/mL
OPIATE SCREEN URINE: NEGATIVE ng/mL
Oxycodone/Oxymorphone, Urine: NEGATIVE ng/mL
Phencyclidine: NEGATIVE ng/mL
Propoxyphene: NEGATIVE ng/mL
pH, Urine: 6.2 (ref 4.5–8.9)

## 2017-08-19 LAB — BENZODIAZEPINES CONFIRM, URINE
ALPRAZOLAM: POSITIVE — AB
Alprazolam Conf.: 166 ng/mL
BENZODIAZEPINES: POSITIVE ng/mL — AB
Clonazepam: NEGATIVE
FLURAZEPAM: NEGATIVE
Lorazepam: NEGATIVE
Midazolam: NEGATIVE
NORDIAZEPAM: NEGATIVE
OXAZEPAM UR: NEGATIVE
Temazepam: NEGATIVE
Triazolam: NEGATIVE

## 2017-08-19 LAB — TRAMADOL GC/MS, URINE
TRAMADOL: POSITIVE — AB
Tramadol gc/ms Conf: >3000 ng/mL

## 2017-08-21 ENCOUNTER — Other Ambulatory Visit: Payer: Self-pay | Admitting: Internal Medicine

## 2017-08-21 MED ORDER — TRAZODONE HCL 100 MG PO TABS: 100.0000 mg | ORAL_TABLET | Freq: Every evening | ORAL | 3 refills | Status: DC | PRN

## 2017-08-21 MED FILL — traZODone HCL 100 MG TABS: 100 | 30 days supply | Qty: 30 | Fill #0

## 2017-08-25 MED FILL — traMADol HCL 50 MG TABS: 50 | 30 days supply | Qty: 240 | Fill #2

## 2017-08-25 MED FILL — LOSARTAN POTASSIUM 50 MG TA: 50 | 30 days supply | Qty: 30 | Fill #1

## 2017-08-26 ENCOUNTER — Telehealth: Payer: Self-pay | Admitting: Internal Medicine

## 2017-08-26 NOTE — Telephone Encounter (Signed)
PC placed to pt yesterday to discuss UDS.  Male answered the phone and I was told to hold as she got him but I had an urgent matter at hand and ended up having to hang up before he came to the phone.   PC placed again this a.m at 8:42, male answered and said pt was asleep.  I left message to tell him that I had called and have sent him a Mychart message that I would like for him to reply to.

## 2017-09-08 ENCOUNTER — Other Ambulatory Visit: Payer: Self-pay

## 2017-09-08 ENCOUNTER — Encounter (HOSPITAL_COMMUNITY): Payer: Self-pay | Admitting: Emergency Medicine

## 2017-09-08 ENCOUNTER — Emergency Department (HOSPITAL_COMMUNITY)
Admission: EM | Admit: 2017-09-08 | Discharge: 2017-09-08 | Disposition: A | Discharge status: Home / Self Care | Payer: Self-pay | Attending: Emergency Medicine | Admitting: Emergency Medicine

## 2017-09-08 ENCOUNTER — Emergency Department (HOSPITAL_COMMUNITY): Payer: Self-pay

## 2017-09-08 DIAGNOSIS — R197 Diarrhea, unspecified: Secondary | ICD-10-CM

## 2017-09-08 DIAGNOSIS — R194 Change in bowel habit: Secondary | ICD-10-CM

## 2017-09-08 DIAGNOSIS — K922 Gastrointestinal hemorrhage, unspecified: Secondary | ICD-10-CM | POA: Insufficient documentation

## 2017-09-08 DIAGNOSIS — R110 Nausea: Secondary | ICD-10-CM

## 2017-09-08 DIAGNOSIS — K2100 Gastro-esophageal reflux disease with esophagitis, without bleeding: Secondary | ICD-10-CM

## 2017-09-08 DIAGNOSIS — R140 Abdominal distension (gaseous): Secondary | ICD-10-CM

## 2017-09-08 DIAGNOSIS — R1013 Epigastric pain: Secondary | ICD-10-CM | POA: Insufficient documentation

## 2017-09-08 DIAGNOSIS — I10 Essential (primary) hypertension: Secondary | ICD-10-CM | POA: Insufficient documentation

## 2017-09-08 DIAGNOSIS — J449 Chronic obstructive pulmonary disease, unspecified: Secondary | ICD-10-CM | POA: Insufficient documentation

## 2017-09-08 DIAGNOSIS — F17210 Nicotine dependence, cigarettes, uncomplicated: Secondary | ICD-10-CM | POA: Insufficient documentation

## 2017-09-08 DIAGNOSIS — K219 Gastro-esophageal reflux disease without esophagitis: Secondary | ICD-10-CM | POA: Insufficient documentation

## 2017-09-08 DIAGNOSIS — Z79899 Other long term (current) drug therapy: Secondary | ICD-10-CM | POA: Insufficient documentation

## 2017-09-08 LAB — CBC
HEMATOCRIT: 52 % (ref 39.0–52.0)
Hemoglobin: 17.2 g/dL — ABNORMAL HIGH (ref 13.0–17.0)
MCH: 28 pg (ref 26.0–34.0)
MCHC: 33.1 g/dL (ref 30.0–36.0)
MCV: 84.6 fL (ref 78.0–100.0)
PLATELETS: 286 10*3/uL (ref 150–400)
RBC: 6.15 MIL/uL — ABNORMAL HIGH (ref 4.22–5.81)
RDW: 13.2 % (ref 11.5–15.5)
WBC: 8.2 10*3/uL (ref 4.0–10.5)

## 2017-09-08 LAB — COMPREHENSIVE METABOLIC PANEL
ALBUMIN: 4.2 g/dL (ref 3.5–5.0)
ALT: 20 U/L (ref 0–44)
AST: 24 U/L (ref 15–41)
Alkaline Phosphatase: 96 U/L (ref 38–126)
Anion gap: 10 (ref 5–15)
BUN: 7 mg/dL (ref 6–20)
CHLORIDE: 102 mmol/L (ref 98–111)
CO2: 26 mmol/L (ref 22–32)
Calcium: 9.6 mg/dL (ref 8.9–10.3)
Creatinine, Ser: 0.85 mg/dL (ref 0.61–1.24)
GFR calc Af Amer: >60 mL/min (ref >60)
GFR calc non Af Amer: >60 mL/min (ref >60)
GLUCOSE: 109 mg/dL — AB (ref 70–99)
Potassium: 4.5 mmol/L (ref 3.5–5.1)
SODIUM: 138 mmol/L (ref 135–145)
Total Bilirubin: 0.9 mg/dL (ref 0.3–1.2)
Total Protein: 7.1 g/dL (ref 6.5–8.1)

## 2017-09-08 LAB — TYPE AND SCREEN
ABO/RH(D): B POS
Antibody Screen: NEGATIVE

## 2017-09-08 LAB — LIPASE, BLOOD: Lipase: 27 U/L (ref 11–51)

## 2017-09-08 LAB — ABO/RH: ABO/RH(D): B POS

## 2017-09-08 MED ORDER — PROMETHAZINE HCL 25 MG/ML IJ SOLN
25.0000 mg | Freq: Once | INTRAMUSCULAR | Status: AC
  Administered 2017-09-08: 25 mg via INTRAVENOUS
  Filled 2017-09-08 (×2): qty 1

## 2017-09-08 MED ORDER — PROMETHAZINE HCL 25 MG PO TABS: 25.0000 mg | ORAL_TABLET | Freq: Three times a day (TID) | ORAL | 0 refills | Status: DC | PRN

## 2017-09-08 MED ORDER — IOHEXOL 300 MG/ML  SOLN
100.0000 mL | Freq: Once | INTRAMUSCULAR | Status: AC | PRN
  Administered 2017-09-08: 100 mL via INTRAVENOUS

## 2017-09-08 MED ORDER — FENTANYL CITRATE (PF) 100 MCG/2ML IJ SOLN
50.0000 ug | INTRAMUSCULAR | Status: DC | PRN
  Administered 2017-09-08: 50 ug via INTRAVENOUS
  Filled 2017-09-08: qty 2

## 2017-09-08 MED ORDER — ESOMEPRAZOLE MAGNESIUM 40 MG PO CPDR: 40.0000 mg | DELAYED_RELEASE_CAPSULE | Freq: Every day | ORAL | 0 refills | Status: DC

## 2017-09-08 MED ORDER — SODIUM CHLORIDE 0.9 % IV BOLUS
1000.0000 mL | Freq: Once | INTRAVENOUS | Status: AC
  Administered 2017-09-08: 1000 mL via INTRAVENOUS

## 2017-09-08 NOTE — ED Triage Notes (Signed)
Pt reports dark, coffee ground stools for the past few days, states he has had a surgical procedure that prevents him from vomiting but has felt nauseated and endorses stomach pain, pt in nad, resp e/u. Skin warm and dry

## 2017-09-08 NOTE — Discharge Instructions (Signed)
Take acid medication. If you were given medicines take as directed.  If you are on coumadin or contraceptives realize their levels and effectiveness is altered by many different medicines.  If you have any reaction (rash, tongues swelling, other) to the medicines stop taking and see a physician.    If your blood pressure was elevated in the ER make sure you follow up for management with a primary doctor or return for chest pain, shortness of breath or stroke symptoms.  Please follow up as directed and return to the ER or see a physician for new or worsening symptoms.  Thank you.

## 2017-09-08 NOTE — Consult Note (Addendum)
[facility] Consult: 2:13 PM 09/08/2017  LOS: 0 days    Referring Provider: Dr Roderic Palau in ED  Primary Care Physician:  Ladell Pier, MD Primary Gastroenterologist:  Dr. Havery Moros    Reason for Consultation:  CG stools, upper abd pain.     HPI: Mark [name] is a 52 y.o. male.  PMH outlined below.  In terms of GI history: IBS.  Colon polyps. GERD. S/p Nissen fundoplication 2683.   IBS diagnosed at the time of a normal colonoscopy in 1998. Most recent colonoscopy 02/2016.  Screening study.  2 polyps removed, size 3 - 5 mm (tubular adenomas, no HGD).  Internal hemorrhoids. 12/2014 EGD.  To investigate heartburn and dysphagia.  Showed normal esophagus.  Nissen fundoplication intact which may explain the patient's complaint of dysphagia.  Dr. Havery Moros did not dilate the fundoplication because of complaints of reflux.  Mild gastritis, biopsied which revealed normal gastric mucosa and no H. Pylori.  At baseline patient has a fair amount of abdominal bloating and intermittent nausea but does not vomit.  He attributes these symptoms to the fact that the Nissen made it impossible for him to belch and dissipate excess gastric air. Starting Friday evening he had worse abdominal distention along with new left >> right abdominal pain and stools started to look like coffee grounds.  No obvious blood.  Between Friday night and all of Saturday he probably had 5 or 6 of these type bowel movements.  He had a couple on Sunday.  He continues to have abdominal discomfort and worse than usual nausea but no vomiting.  The symptoms as well as the altered appearance to stool occurred before he started taking bismuth.  When he took bismuth on Saturday, it made no difference in the appearance of his stools or in the level of his GI  symptoms.  He would have stayed at home but his mother was concerned and told him he should go to the emergency department. Blood pressure somewhat hypertensive with readings as high as 140s over 94.  Pulse in the 50s.  No fever.  Hgb 17.2.  White blood cell count 8.2.  Normal platelets.   Normal renal function, LFTs, electrolytes. Abdominal pelvic CT with contrast showed mild fatty liver.  No enteritis, no colitis.  Some aortoiliac calcification but major vascular structures of the abdomen, pelvis patent.  Patent portal venous system.  Home medications remarkable for no aspirin, no NSAIDs, no PPI, no H2 blocker. ETOH intake: 2 or 3 cans of beer every few days.  The last time he drank was 1 beer, last night.     Past Medical History:  Diagnosis Date  . Alcohol withdrawal ([place])   . Allergy   . Anxiety Dx 2014  . COPD (chronic obstructive pulmonary disease) ([place])   . Drug abuse ([place])    pt reports opioid dependence due to previous back surgeries  . Enlarged prostate   . ETOH abuse   . GERD (gastroesophageal reflux disease) Dx 2003  . Headache(784.0)   . Hepatitis   . Hyperlipidemia  Dx 2000  . Hypertension Dx 2011  . IBS (irritable bowel syndrome)    alternates constiaption/ diarrhea  . Irregular heart beat   . 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 Mental disorder   . Neuromuscular disorder ([place])    hands hurt   . Neuropathy 06/07/2013  . Seizure due to alcohol withdrawal ([place]) 01/23/2014   Per patient report  . Shortness of breath   . Withdrawal seizures ([place]) 2014   last seizure 2014 etoh with drawal    Past Surgical History:  Procedure Laterality Date  . [place] STUDY N/A 01/16/2015   Procedure: [place] STUDY;  Surgeon: Manus Gunning, MD;  Location: [facility];  Service: Gastroenterology;  Laterality: N/A;  . [place]  . COLONOSCOPY  1998  . LEFT HEART CATHETERIZATION WITH CORONARY ANGIOGRAM N/A 01/13/2014   Procedure: LEFT HEART CATHETERIZATION  WITH CORONARY ANGIOGRAM;  Surgeon: Clent Demark, MD;  Location: [facility];  Service: Cardiovascular;  Laterality: N/A;  . NASAL SINUS SURGERY    . NECK SURGERY  2000   cervial fusion   . NISSEN FUNDOPLICATION    . SUBACROMIAL DECOMPRESSION Right 11/16/2014   Procedure: RIGHT SHOULDER ARTHROSCOPY WITH SUBACROMIAL DECOMPRESSION ;  Surgeon: Leandrew Koyanagi, MD;  Location: [place];  Service: Orthopedics;  Laterality: Right;  . UPPER GASTROINTESTINAL ENDOSCOPY      Prior to Admission medications   Medication Sig Start Date End Date Taking? Authorizing Provider  acetaminophen (TYLENOL) 500 MG tablet Take 1,000 mg by mouth as needed for headache.   Yes [provider]  albuterol (VENTOLIN HFA) 108 (90 Base) MCG/ACT inhaler Inhale 2 puffs into the lungs every 6 (six) hours as needed for wheezing or shortness of breath. 08/12/17  Yes Ladell Pier, MD  cyclobenzaprine (FLEXERIL) 10 MG tablet Take 1 tablet (10 mg total) by mouth at bedtime. 06/19/17 09/17/17 Yes Vevelyn Francois, NP  gabapentin (NEURONTIN) 800 MG tablet Take 1 tablet (800 mg total) by mouth 4 (four) times daily. Patient taking differently: Take 800 mg by mouth 3 (three) times daily.  06/19/17 09/17/17 Yes King, Diona Foley, NP  losartan (COZAAR) 50 MG tablet Take 50 mg by mouth daily. 08/25/17  Yes [provider]  metoprolol (LOPRESSOR) 50 MG tablet Take 1 tablet (50 mg total) by mouth 2 (two) times daily. 07/24/15  Yes Funches, Josalyn, MD  traMADol (ULTRAM) 50 MG tablet Take 2 tablets (100 mg total) by mouth 4 (four) times daily. 1-2 tablets 06/19/17 09/17/17 Yes Vevelyn Francois, NP  traZODone (DESYREL) 100 MG tablet Take 1 tablet (100 mg total) by mouth at bedtime as needed. 08/21/17  Yes Ladell Pier, MD  acetaminophen-codeine (TYLENOL #3) 300-30 MG tablet Take 1 tablet by mouth every 8 (eight) hours as needed for moderate pain. Patient not taking: Reported on 09/08/2017 08/12/17   Ladell Pier, MD   acetaminophen-codeine (TYLENOL #3) 300-30 MG tablet Take 1 tablet by mouth every 8 (eight) hours as needed for moderate pain. Patient not taking: Reported on 09/08/2017 09/11/17   Ladell Pier, MD  atorvastatin (LIPITOR) 20 MG tablet Take 1 tablet (20 mg total) by mouth daily. Patient not taking: Reported on 09/08/2017 04/10/17   Charlott Rakes, MD  CIALIS 5 MG tablet TAKE 1 TABLET BY MOUTH DAILY. Patient not taking: Reported on 09/08/2017 06/12/17   Ladell Pier, MD  tamsulosin (FLOMAX) 0.4 MG CAPS capsule Take 1 capsule (0.4 mg  total) by mouth daily after supper. Patient not taking: Reported on 09/08/2017 04/03/17   Ladell Pier, MD    Scheduled Meds:  Infusions:  PRN Meds: fentaNYL (SUBLIMAZE) injection   Allergies as of 09/08/2017 - Review Complete 09/08/2017  Allergen Reaction Noted  . Cozaar [losartan] Anaphylaxis 02/28/2014  . Lisinopril Anaphylaxis 03/20/2011    Family History  Problem Relation Age of Onset  . Lung cancer Father        was a smoker  . Alcohol abuse Father   . Hypertension Mother   . Colon polyps Mother   . Breast cancer Paternal Grandmother   . Alcohol abuse Paternal Grandfather   . Colon cancer Neg Hx   . Rectal cancer Neg Hx   . Stomach cancer Neg Hx     Social History   Socioeconomic History  . Marital status: Divorced    Spouse name: Not on file  . Number of children: 2  . Years of education: Not on file  . Highest education level: Not on file  Occupational History  . Occupation: [employer]   Social Needs  . Financial resource strain: Not on file  . Food insecurity:    Worry: Not on file    Inability: Not on file  . Transportation needs:    Medical: Not on file    Non-medical: Not on file  Tobacco Use  . Smoking status: Current Every Day Smoker    Packs/day: 0.50    Years: 30.00    Pack years: 15.00    Types: Cigarettes  . Smokeless tobacco: Never Used  . Tobacco comment: 11-30-2014 info given, Has cut back from 2 pks to  1/2 pack and "working on it"  Substance and Sexual Activity  . Alcohol use: No    Alcohol/week: 0.0 oz    Comment: occ; binge drinking occasionally  . Drug use: No    Comment: Opana  . Sexual activity: Not Currently  Lifestyle  . Physical activity:    Days per week: Not on file    Minutes per session: Not on file  . Stress: Not on file  Relationships  . Social connections:    Talks on phone: Not on file    Gets together: Not on file    Attends religious service: Not on file    Active member of club or organization: Not on file    Attends meetings of clubs or organizations: Not on file    Relationship status: Not on file  . Intimate partner violence:    Fear of current or ex partner: Not on file    Emotionally abused: Not on file    Physically abused: Not on file    Forced sexual activity: Not on file  Other Topics Concern  . Not on file  Social History Narrative  . Not on file    REVIEW OF SYSTEMS: Constitutional: Feels kind of weak. ENT:  No nose bleeds Pulm: No shortness of breath, no cough. CV: Occasional palpitations but not recently an issue.  No LE edema.  No chest pain. GU:  No hematuria, no frequency.  Urine looks darker than usual which happens to him intermittently. GI:  Per HPI Heme: No unusual bleeding or bruising tendencies. Transfusions: None. Neuro: Has a headache today.  No peripheral tingling or numbness Derm:  No itching, no rash or sores.  Endocrine:  No sweats or chills.  No polyuria or dysuria Immunization: Queried. Travel:  None beyond local counties in last few months.  PHYSICAL EXAM: Vital signs in last 24 hours: Vitals:   09/08/17 1300 09/08/17 1345  BP: (!) 143/84 (!) 149/94  Pulse: (!) 59   Resp: 13   Temp:    SpO2: 93% 97%   Wt Readings from Last 3 Encounters:  08/12/17 217 lb 9.6 oz (98.7 kg)  06/19/17 213 lb (96.6 kg)  04/03/17 207 lb 12.8 oz (94.3 kg)    General: Pleasant, looks mildly unwell.  Not toxic. Head: No  facial asymmetry or swelling.  No signs of head trauma. Eyes: No scleral icterus.  No conjunctival pallor.  EOMI Ears: Not hard of hearing. Nose: No congestion or discharge. Mouth: Oropharynx moist, pink, clear.  Tongue midline. Neck: No JVD, no masses, no thyromegaly. Lungs: Clear bilaterally.  No adventitious sounds, no dyspnea, no cough. Heart: RRR.  No MRG.  S1, S2 present Abdomen: Soft.  Slightly distended.  No HSM, masses, bruits, hernias.  Tenderness diffusely with the exception of none in the right lower quadrant.  Bowel sounds active without tinkling or tympanitic high-pitched sounds.   Rectal: No palpable stool but dark, evenly brown (nothing that looks like coffee ground) is FOBT negative. Musc/Skeltl: No joint swelling, redness or significant deformity. Extremities: No CCE. Neurologic: Alert.  Oriented x3.  Moves all 4 limbs.  No tremors, no asterixis, no limb weakness. Skin: No telangiectasia, sores, rashes, suspicious lesions. Nodes: No cervical adenopathy. Psych: Pleasant, cooperative, slightly anxious, fluid speech.  Intake/Output from previous day: No intake/output data recorded. Intake/Output this shift: Total I/O In: 1000 [IV Piggyback:1000] Out: -   LAB RESULTS: Recent Labs    09/08/17 1103  WBC 8.2  HGB 17.2*  HCT 52.0  PLT 286   BMET Lab Results  Component Value Date   NA 138 09/08/2017   NA 139 04/09/2017   NA 138 05/15/2016   K 4.5 09/08/2017   K 4.6 04/09/2017   K 3.4 (L) 05/15/2016   CL 102 09/08/2017   CL 97 04/09/2017   CL 101 05/15/2016   CO2 26 09/08/2017   CO2 27 04/09/2017   CO2 25 05/15/2016   GLUCOSE 109 (H) 09/08/2017   GLUCOSE 109 (H) 04/09/2017   GLUCOSE 101 (H) 05/15/2016   BUN 7 09/08/2017   BUN 12 04/09/2017   BUN 5 (L) 05/15/2016   CREATININE 0.85 09/08/2017   CREATININE 1.00 04/09/2017   CREATININE 0.72 05/15/2016   CALCIUM 9.6 09/08/2017   CALCIUM 9.9 04/09/2017   CALCIUM 9.0 05/15/2016   LFT Recent Labs     09/08/17 1103  PROT 7.1  ALBUMIN 4.2  AST 24  ALT 20  ALKPHOS 96  BILITOT 0.9   PT/INR Lab Results  Component Value Date   INR 1.04 06/07/2013   INR 0.96 06/09/2012   Hepatitis Panel No results for input(s): HEPBSAG, HCVAB, HEPAIGM, HEPBIGM in the last 72 hours. C-Diff No components found for: CDIFF Lipase     Component Value Date/Time   LIPASE 27 09/08/2017 1208    Drugs of Abuse     Component Value Date/Time   LABOPIA NONE DETECTED 05/15/2016 2123   COCAINSCRNUR NONE DETECTED 05/15/2016 2123   LABBENZ NONE DETECTED 05/15/2016 2123   AMPHETMU NONE DETECTED 05/15/2016 2123   THCU NONE DETECTED 05/15/2016 2123   LABBARB NONE DETECTED 05/15/2016 2123     RADIOLOGY STUDIES: Ct Abdomen Pelvis W Contrast  Result Date: 09/08/2017 CLINICAL DATA:  52 year old male with diffuse abdominal pain, progressed for 4 days. Dark watery stools yesterday. EXAM: CT ABDOMEN AND  PELVIS WITH CONTRAST TECHNIQUE: Multidetector CT imaging of the abdomen and pelvis was performed using the standard protocol following bolus administration of intravenous contrast. CONTRAST:  19mL OMNIPAQUE IOHEXOL 300 MG/ML  SOLN COMPARISON:  Lumbar MRI 05/22/2017. CT Abdomen and Pelvis 04/12/2014. FINDINGS: Lower chest: Centrilobular emphysema in the lower lobes. Chronic posterior basal segment lower lobe scarring and mild architectural distortion is stable. No pericardial or pleural effusion. Hepatobiliary: Mild hepatic steatosis. Otherwise negative liver and gallbladder. Pancreas: Negative. Spleen: Negative. Adrenals/Urinary Tract: Normal adrenal glands. Symmetric and normal bilateral renal enhancement and contrast excretion. Both ureters are normal. Diminutive and unremarkable urinary bladder. Stomach/Bowel: Negative rectum. The sigmoid colon appears stable since 2016 with minimal diverticula, no active inflammation. Negative left colon and transverse colon. The right colon is decompressed and within normal limits. The  appendix is normal (coronal image 28). The terminal ileum is decompressed and negative. No dilated or abnormal small bowel. Chronic postoperative changes about the gastric fundus. Otherwise negative stomach and duodenum. No mesenteric inflammation identified. No abdominal free air, free fluid. Vascular/Lymphatic: Aortoiliac calcified atherosclerosis. The major arterial structures in the abdomen and pelvis are patent. Grossly patent portal venous system. No lymphadenopathy. Reproductive: Negative. Other: No pelvic free fluid. Musculoskeletal: Chronic lower lumbar spine interbody fusion implants. Chronic bilateral femoral head AVN. No acute osseous abnormality identified. IMPRESSION: 1. No acute or inflammatory process identified in the abdomen or pelvis. 2. Aortic Atherosclerosis (ICD10-I70.0). Pulmonary lower lobe Emphysema (ICD10-J43.9). 3. Mild chronic hepatic steatosis.  Chronic femoral head AVN. Electronically Signed   By: Genevie Ann M.D.   On: 09/08/2017 13:10      IMPRESSION:   *    Altered bowel habits with coffee-ground looking diarrheal stool for the past 3 days associated with acute nausea inpatient with history of intermittent nausea, increased abdominal bloating and patient with chronic low level abdominal bloating.  New left-sided abdominal pain. FOBT negative.  No signs of active infection, i.e. no fever and no leukocytosis.  Suspect some sort of gastroenteritis. In 2014 had gastritis on EGD and he is not taking PPI    PLAN:     *    Patient can go home but if he has a bowel movement before discharge, this should be collected for stool pathogen PCR as well as C. difficile, I placed the orders.  However since he has not had a bowel movement for several hours, there is no need or reason to detain him in the ED until he has a bowel movement. If he continues to have problems once he discharges that last more than the next few days, he can call the GI office or his PMD and submit stool  specimen at that point. Dr. Ardis Hughs advises patient start taking over-the-counter PPI, omeprazole 20 mg once daily and zantac PRN.  SHould avoid pepto bismol and definitely avoid alkaseltzer.   Azucena Freed  09/08/2017, 2:13 PM Phone [phone]  ________________________________________________________________________  Velora Heckler GI MD note:  I personally examined the patient, reviewed the data and agree with the assessment and plan described above.  Chronic dyspepsia. He takes Administrator and Pepto bismol pretty often.  Once in a while he'll take a zantac.  He is not having a GI bleed (Hb normal, heme negative), perhaps the change in stools is an infectious diarrhea.  If he has BM prior to ER d/c then would be great to collect and send, as above.    OK to d/c from ER, advised omeprazole 20mg  OTC pill once daily  and zantac PRN.  Avoid pepto, alkaseltzer.   Owens Loffler, MD [facility] Pager [phone]

## 2017-09-08 NOTE — ED Provider Notes (Signed)
[facility] EMERGENCY DEPARTMENT Provider Note   CSN: [id number] Arrival date & time: 09/08/17  0945     History   Chief Complaint Chief Complaint  Patient presents with  . GI Bleeding    HPI Mark [name] is a 52 y.o. male.  Patient with history of alcohol abuse, COPD, alcohol withdrawal seizures, high blood pressure, reflux, history of gastroenterologist le presents with upper abdominal pain and melena Bauer and EGD in the past over the weekend.  Patient said this in the past but usually goes away and this is worsening second episode.  Patient had worsening nausea.  Patient denies fevers or chills.  Patient denies alcohol abuse at this time and no NSAIDs.      Past Medical History:  Diagnosis Date  . Alcohol withdrawal ([place])   . Allergy   . Anxiety Dx 2014  . COPD (chronic obstructive pulmonary disease) ([place])   . Drug abuse ([place])    pt reports opioid dependence due to previous back surgeries  . Enlarged prostate   . ETOH abuse   . GERD (gastroesophageal reflux disease) Dx 2003  . Headache(784.0)   . Hepatitis   . Hyperlipidemia Dx 2000  . Hypertension Dx 2011  . IBS (irritable bowel syndrome)    alternates constiaption/ diarrhea  . Irregular heart beat   . 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 Mental disorder   . Neuromuscular disorder ([place])    hands hurt   . Neuropathy 06/07/2013  . Seizure due to alcohol withdrawal ([place]) 01/23/2014   Per patient report  . Shortness of breath   . Withdrawal seizures ([place]) 2014   last seizure 2014 etoh with drawal    Patient Active Problem List   Diagnosis Date Noted  . Dyspepsia   . Opioid-induced hyperalgesia (with oxycodone) 02/11/2017  . Allodynia (Lower extremities) 12/26/2016  . Hyperalgesia (Lower extremities) 12/26/2016  . Sympathetic pain (lower extremity) 09/26/2016  . Lower extremity neuropathy (Primary Source of Pain) (Bilateral) (R>L) 09/26/2016  . Lumbar facet syndrome (Bilateral) (R>L)  08/22/2016  . Chronic sacroiliac joint pain (Bilateral) (R>L) 08/22/2016  . Chronic lower extremity pain (Bilateral) (R>L) 06/26/2016  . Opiate use 05/14/2016  . Occipital headache (Bilateral) (R>L) 04/16/2016  . Musculoskeletal pain 04/15/2016  . 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 Major depressive disorder, recurrent episode, moderate ([place]) 04/10/2016  . Long term current use of opiate analgesic 03/18/2016  . Long term prescription opiate use 03/18/2016  . Chronic neck pain (Bilateral)(R>L) 03/18/2016  . Cervical fusion syndrome 03/18/2016  . 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 Chronic knee pain  (Bilateral) (R>L) 03/18/2016  . GAD (generalized anxiety disorder) 12/06/2015  . Alcohol use disorder, severe, in early remission ([place]) 12/06/2015  . GERD (gastroesophageal reflux disease) 07/24/2015  . Chronic anxiety 12/19/2014  . Erectile dysfunction 09/16/2014  . Poor dentition 07/28/2014  . Chronic maxillary sinusitis 07/28/2014  . Vitamin D insufficiency 07/05/2014  . Chronic fatigue 07/04/2014  . Chronic radicular low back pain [facility] source of pain) (Right) (to calf) 06/02/2014  . Chronic shoulder pain (Right) 05/12/2014  . Alcohol use disorder, severe, dependence ([place])   . 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 Hypokalemia   . Opioid dependence ([place]) 01/22/2014    Class: Acute  . Paroxysmal VT ([place]) 12/19/2013  . Chronic low back pain (Secondary source of pain) (Bilateral) (R>L) 12/28/2012  . Chronic pain syndrome 12/28/2012  . Diarrhea 12/28/2012  . Alcohol intoxication ([place]) 06/09/2012  . 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 Nonsustained ventricular tachycardia ([place]) 01/20/2012  . Tobacco abuse 01/19/2012  . Essential hypertension   . Hyperlipidemia     Past Surgical History:  Procedure Laterality Date  . [place] STUDY N/A 01/16/2015   Procedure: [place] STUDY;  Surgeon: Manus Gunning, MD;  Location: [facility]  ENDOSCOPY;  Service: Gastroenterology;  Laterality: N/A;  . [place]  . COLONOSCOPY  1998  . LEFT HEART CATHETERIZATION WITH CORONARY ANGIOGRAM N/A 01/13/2014   Procedure: LEFT HEART CATHETERIZATION WITH CORONARY ANGIOGRAM;  Surgeon: Clent Demark, MD;  Location: [facility] CATH LAB;  Service: Cardiovascular;  Laterality: N/A;  . NASAL SINUS SURGERY    . NECK SURGERY  2000   cervial fusion   . NISSEN FUNDOPLICATION    . SUBACROMIAL DECOMPRESSION Right 11/16/2014   Procedure: RIGHT SHOULDER ARTHROSCOPY WITH SUBACROMIAL DECOMPRESSION ;  Surgeon: Leandrew Koyanagi, MD;  Location: [facility];  Service: Orthopedics;  Laterality: Right;  . UPPER GASTROINTESTINAL ENDOSCOPY          Home Medications    Prior to Admission medications   Medication Sig Start Date End Date Taking? Authorizing Provider  acetaminophen (TYLENOL) 500 MG tablet Take 1,000 mg by mouth as needed for headache.   Yes [provider]  albuterol (VENTOLIN HFA) 108 (90 Base) MCG/ACT inhaler Inhale 2 puffs into the lungs every 6 (six) hours as needed for wheezing or shortness of breath. 08/12/17  Yes Ladell Pier, MD  cyclobenzaprine (FLEXERIL) 10 MG tablet Take 1 tablet (10 mg total) by mouth at bedtime. 06/19/17 09/17/17 Yes Vevelyn Francois, NP  gabapentin (NEURONTIN) 800 MG tablet Take 1 tablet (800 mg total) by mouth 4 (four) times daily. Patient taking differently: Take 800 mg by mouth 3 (three) times daily.  06/19/17 09/17/17 Yes King, Diona Foley, NP  losartan (COZAAR) 50 MG tablet Take 50 mg by mouth daily. 08/25/17  Yes [provider]  metoprolol (LOPRESSOR) 50 MG tablet Take 1 tablet (50 mg total) by mouth 2 (two) times daily. 07/24/15  Yes Funches, Josalyn, MD  traMADol (ULTRAM) 50 MG tablet Take 2 tablets (100 mg total) by mouth 4 (four) times daily. 1-2 tablets 06/19/17 09/17/17 Yes Vevelyn Francois, NP  traZODone (DESYREL) 100 MG tablet Take 1 tablet (100 mg total) by mouth at bedtime  as needed. 08/21/17  Yes Ladell Pier, MD  acetaminophen-codeine (TYLENOL #3) 300-30 MG tablet Take 1 tablet by mouth every 8 (eight) hours as needed for moderate pain. Patient not taking: Reported on 09/08/2017 08/12/17   Ladell Pier, MD  acetaminophen-codeine (TYLENOL #3) 300-30 MG tablet Take 1 tablet by mouth every 8 (eight) hours as needed for moderate pain. Patient not taking: Reported on 09/08/2017 09/11/17   Ladell Pier, MD  atorvastatin (LIPITOR) 20 MG tablet Take 1 tablet (20 mg total) by mouth daily. Patient not taking: Reported on 09/08/2017 04/10/17   Charlott Rakes, MD  CIALIS 5 MG tablet TAKE 1 TABLET BY MOUTH DAILY. Patient not taking: Reported on 09/08/2017 06/12/17   Ladell Pier, MD  esomeprazole (NEXIUM) 40 MG capsule Take 1 capsule (40 mg total) by mouth daily. 09/08/17   Elnora Morrison, MD  promethazine (PHENERGAN) 25 MG tablet Take 1 tablet (25 mg total) by mouth every 8 (eight) hours as needed for nausea or vomiting. 09/08/17   Elnora Morrison, MD  tamsulosin (FLOMAX) 0.4 MG CAPS capsule Take 1 capsule (0.4  mg total) by mouth daily after supper. Patient not taking: Reported on 09/08/2017 04/03/17   Ladell Pier, MD    Family History Family History  Problem Relation Age of Onset  . Lung cancer Father        was a smoker  . Alcohol abuse Father   . Hypertension Mother   . Colon polyps Mother   . Breast cancer Paternal Grandmother   . Alcohol abuse Paternal Grandfather   . Colon cancer Neg Hx   . Rectal cancer Neg Hx   . Stomach cancer Neg Hx     Social History Social History   Tobacco Use  . Smoking status: Current Every Day Smoker    Packs/day: 0.50    Years: 30.00    Pack years: 15.00    Types: Cigarettes  . Smokeless tobacco: Never Used  . Tobacco comment: 11-30-2014 info given, Has cut back from 2 pks to 1/2 pack and "working on it"  Substance Use Topics  . Alcohol use: No    Alcohol/week: 0.0 oz    Comment: occ; binge drinking  occasionally  . Drug use: No    Comment: Opana     Allergies   Cozaar [losartan] and Lisinopril   Review of Systems Review of Systems  Constitutional: Negative for chills and fever.  HENT: Negative for congestion.   Eyes: Negative for visual disturbance.  Respiratory: Negative for shortness of breath.   Cardiovascular: Negative for chest pain.  Gastrointestinal: Positive for abdominal pain, blood in stool, nausea and vomiting.  Genitourinary: Negative for dysuria and flank pain.  Musculoskeletal: Negative for back pain, neck pain and neck stiffness.  Skin: Negative for rash.  Neurological: Negative for light-headedness and headaches.    Is Physical Exam Updated Vital Signs BP (!) 143/78   Pulse (!) 45   Temp 98.1 F (36.7 C) (Oral)   Resp 14   SpO2 98%   Physical Exam  Constitutional: He is oriented to person, place, and time. He appears well-developed and well-nourished.  HENT:  Head: Normocephalic and atraumatic.  Eyes: Right eye exhibits no discharge. Left eye exhibits no discharge.  Neck: Normal range of motion. Neck supple. No tracheal deviation present.  Cardiovascular: Normal rate and regular rhythm.  Pulmonary/Chest: Effort normal and breath sounds normal.  Abdominal: Soft. He exhibits no distension. There is tenderness (central and upper). There is no guarding.  Musculoskeletal: He exhibits no edema.  Neurological: He is alert and oriented to person, place, and time.  Skin: Skin is warm. No rash noted.  Psychiatric: He has a normal mood and affect.  Nursing note and vitals reviewed.    ED Treatments / Results  Labs (all labs ordered are listed, but only abnormal results are displayed) Labs Reviewed  COMPREHENSIVE METABOLIC PANEL - Abnormal; Notable for the following components:      Result Value   Glucose, Bld 109 (*)    All other components within normal limits  CBC - Abnormal; Notable for the following components:   RBC 6.15 (*)    Hemoglobin  17.2 (*)    All other components within normal limits  GASTROINTESTINAL PANEL BY PCR, STOOL (REPLACES STOOL CULTURE)  C DIFFICILE QUICK SCREEN W PCR REFLEX  LIPASE, BLOOD  TYPE AND SCREEN  ABO/RH    EKG EKG Interpretation  Date/Time:  Monday September 08 2017 12:03:45 EDT Ventricular Rate:  59 PR Interval:    QRS Duration: 103 QT Interval:  439 QTC Calculation: 435 R Axis:  39 Text Interpretation:  Sinus rhythm Abnormal R-wave progression, early transition Confirmed by Elnora Morrison [phone]) on 09/08/2017 1:28:02 PM   Radiology Ct Abdomen Pelvis W Contrast  Result Date: 09/08/2017 CLINICAL DATA:  52 year old male with diffuse abdominal pain, progressed for 4 days. Dark watery stools yesterday. EXAM: CT ABDOMEN AND PELVIS WITH CONTRAST TECHNIQUE: Multidetector CT imaging of the abdomen and pelvis was performed using the standard protocol following bolus administration of intravenous contrast. CONTRAST:  152mL OMNIPAQUE IOHEXOL 300 MG/ML  SOLN COMPARISON:  Lumbar MRI 05/22/2017. CT Abdomen and Pelvis 04/12/2014. FINDINGS: Lower chest: Centrilobular emphysema in the lower lobes. Chronic posterior basal segment lower lobe scarring and mild architectural distortion is stable. No pericardial or pleural effusion. Hepatobiliary: Mild hepatic steatosis. Otherwise negative liver and gallbladder. Pancreas: Negative. Spleen: Negative. Adrenals/Urinary Tract: Normal adrenal glands. Symmetric and normal bilateral renal enhancement and contrast excretion. Both ureters are normal. Diminutive and unremarkable urinary bladder. Stomach/Bowel: Negative rectum. The sigmoid colon appears stable since 2016 with minimal diverticula, no active inflammation. Negative left colon and transverse colon. The right colon is decompressed and within normal limits. The appendix is normal (coronal image 28). The terminal ileum is decompressed and negative. No dilated or abnormal small bowel. Chronic postoperative changes about the  gastric fundus. Otherwise negative stomach and duodenum. No mesenteric inflammation identified. No abdominal free air, free fluid. Vascular/Lymphatic: Aortoiliac calcified atherosclerosis. The major arterial structures in the abdomen and pelvis are patent. Grossly patent portal venous system. No lymphadenopathy. Reproductive: Negative. Other: No pelvic free fluid. Musculoskeletal: Chronic lower lumbar spine interbody fusion implants. Chronic bilateral femoral head AVN. No acute osseous abnormality identified. IMPRESSION: 1. No acute or inflammatory process identified in the abdomen or pelvis. 2. Aortic Atherosclerosis (ICD10-I70.0). Pulmonary lower lobe Emphysema (ICD10-J43.9). 3. Mild chronic hepatic steatosis.  Chronic femoral head AVN. Electronically Signed   By: Genevie Ann M.D.   On: 09/08/2017 13:10    Procedures Procedures (including critical care time)  Medications Ordered in ED Medications  fentaNYL (SUBLIMAZE) injection 50 mcg (50 mcg Intravenous Given 09/08/17 1205)  sodium chloride 0.9 % bolus 1,000 mL (0 mLs Intravenous Stopped 09/08/17 1308)  iohexol (OMNIPAQUE) 300 MG/ML solution 100 mL (100 mLs Intravenous Contrast Given 09/08/17 1242)  promethazine (PHENERGAN) injection 25 mg (25 mg Intravenous Given 09/08/17 1339)     Initial Impression / Assessment and Plan / ED Course  I have reviewed the triage vital signs and the nursing notes.  Pertinent labs & imaging results that were available during my care of the patient were reviewed by me and considered in my medical decision making (see chart for details).    Patient with alcohol abuse history presents with clinical concern for gastritis/ulcer.  With significant pain and worsening symptoms with melena plan for CT scan to look for signs of perforation.  Plan for nausea meds, pain meds, IV fluids and reassessment.  Blood work reviewed hemoglobin normal. Patient's heart rate low not on beta-blocker, no syncope or lightheadedness. Patient  symptoms improved in the ER.  CT scan results reviewed no perforation.  Patient did not have any bloody bowel movements or melanotic stool in the ER.  Gastroenterology consulted evaluate the patient recommended outpatient follow-up with reflux medications.  Recommended follow-up stool culture outpatient.  Results and differential diagnosis were discussed with the patient/parent/guardian. Xrays were independently reviewed by myself.  Close follow up outpatient was discussed, comfortable with the plan.   Medications  fentaNYL (SUBLIMAZE) injection 50 mcg (50 mcg Intravenous Given 09/08/17 1205)  sodium chloride 0.9 % bolus 1,000 mL (0 mLs Intravenous Stopped 09/08/17 1308)  iohexol (OMNIPAQUE) 300 MG/ML solution 100 mL (100 mLs Intravenous Contrast Given 09/08/17 1242)  promethazine (PHENERGAN) injection 25 mg (25 mg Intravenous Given 09/08/17 1339)    Vitals:   09/08/17 1230 09/08/17 1300 09/08/17 1345 09/08/17 1500  BP: (!) 149/80 (!) 143/84 (!) 149/94 (!) 143/78  Pulse: (!) 56 (!) 59  (!) 45  Resp: 13 13  14   Temp:      TempSrc:      SpO2: 98% 93% 97% 98%    Final diagnoses:  Acute GI bleeding  Reflux esophagitis     Final Clinical Impressions(s) / ED Diagnoses   Final diagnoses:  Acute GI bleeding  Reflux esophagitis    ED Discharge Orders        Ordered    esomeprazole (NEXIUM) 40 MG capsule  Daily     09/08/17 1521    promethazine (PHENERGAN) 25 MG tablet  Every 8 hours PRN     09/08/17 1531       Elnora Morrison, MD 09/08/17 1533

## 2017-09-09 ENCOUNTER — Telehealth: Payer: Self-pay

## 2017-09-09 NOTE — Telephone Encounter (Signed)
Please see Dr. Doyne Keel note and schedule, APP okay to see in follow up. Thank you.

## 2017-09-09 NOTE — Telephone Encounter (Signed)
Almyra Free this patient was just seen yesterday in the ED by Dr. Ardis Hughs. He was advised to take omeprazole 20mg  OTC pill once daily and zantac PRN and to avoid pepto, alkaseltzer. Sounds like he needs a follow up visit if you can help coordinate. Thanks

## 2017-09-10 MED FILL — GABAPENTIN 800 MG TABLET: 800 | 30 days supply | Qty: 120 | Fill #2

## 2017-09-10 MED FILL — $CIALIS 5 MG TABLET: 5 | 30 days supply | Qty: 30 | Fill #3

## 2017-09-10 MED FILL — ?METOPROLOL 50 MG TABLET: 50 | 30 days supply | Qty: 90 | Fill #3

## 2017-09-10 NOTE — Telephone Encounter (Signed)
Left message for patient with lady for patient to return my call to schedule an office visit.

## 2017-09-15 MED FILL — ACETAMINOPHEN/COD #3 TABLET: 300-30 | 30 days supply | Qty: 90 | Fill #0

## 2017-09-18 ENCOUNTER — Encounter: Payer: Self-pay | Admitting: Nurse Practitioner

## 2017-09-18 ENCOUNTER — Other Ambulatory Visit: Payer: Self-pay

## 2017-09-18 ENCOUNTER — Ambulatory Visit: Payer: Self-pay | Attending: Nurse Practitioner | Admitting: Nurse Practitioner

## 2017-09-18 VITALS — BP 180/96 | HR 61 | Temp 97.8°F | Resp 18 | Ht 76.0 in | Wt 215.0 lb

## 2017-09-18 DIAGNOSIS — J449 Chronic obstructive pulmonary disease, unspecified: Secondary | ICD-10-CM | POA: Insufficient documentation

## 2017-09-18 DIAGNOSIS — M7918 Myalgia, other site: Secondary | ICD-10-CM

## 2017-09-18 DIAGNOSIS — M25552 Pain in left hip: Secondary | ICD-10-CM | POA: Insufficient documentation

## 2017-09-18 DIAGNOSIS — Z5181 Encounter for therapeutic drug level monitoring: Secondary | ICD-10-CM | POA: Insufficient documentation

## 2017-09-18 DIAGNOSIS — G894 Chronic pain syndrome: Secondary | ICD-10-CM

## 2017-09-18 DIAGNOSIS — Z79899 Other long term (current) drug therapy: Secondary | ICD-10-CM | POA: Insufficient documentation

## 2017-09-18 DIAGNOSIS — M25562 Pain in left knee: Secondary | ICD-10-CM | POA: Insufficient documentation

## 2017-09-18 DIAGNOSIS — M47816 Spondylosis without myelopathy or radiculopathy, lumbar region: Secondary | ICD-10-CM

## 2017-09-18 DIAGNOSIS — G5793 Unspecified mononeuropathy of bilateral lower limbs: Secondary | ICD-10-CM

## 2017-09-18 DIAGNOSIS — G629 Polyneuropathy, unspecified: Secondary | ICD-10-CM

## 2017-09-18 DIAGNOSIS — M25561 Pain in right knee: Secondary | ICD-10-CM | POA: Insufficient documentation

## 2017-09-18 DIAGNOSIS — F1020 Alcohol dependence, uncomplicated: Secondary | ICD-10-CM | POA: Insufficient documentation

## 2017-09-18 DIAGNOSIS — Z79891 Long term (current) use of opiate analgesic: Secondary | ICD-10-CM | POA: Insufficient documentation

## 2017-09-18 DIAGNOSIS — M25551 Pain in right hip: Secondary | ICD-10-CM | POA: Insufficient documentation

## 2017-09-18 DIAGNOSIS — M533 Sacrococcygeal disorders, not elsewhere classified: Secondary | ICD-10-CM | POA: Insufficient documentation

## 2017-09-18 DIAGNOSIS — E785 Hyperlipidemia, unspecified: Secondary | ICD-10-CM | POA: Insufficient documentation

## 2017-09-18 DIAGNOSIS — I10 Essential (primary) hypertension: Secondary | ICD-10-CM | POA: Insufficient documentation

## 2017-09-18 DIAGNOSIS — F339 Major depressive disorder, recurrent, unspecified: Secondary | ICD-10-CM | POA: Insufficient documentation

## 2017-09-18 DIAGNOSIS — G8929 Other chronic pain: Secondary | ICD-10-CM

## 2017-09-18 MED ORDER — PROMETHAZINE HCL 25 MG PO TABS: 25.0000 mg | ORAL_TABLET | Freq: Four times a day (QID) | ORAL | 0 refills | Status: DC | PRN

## 2017-09-18 MED ORDER — TRAMADOL HCL 50 MG PO TABS: 100.0000 mg | ORAL_TABLET | Freq: Four times a day (QID) | ORAL | 2 refills | Status: DC

## 2017-09-18 MED ORDER — GABAPENTIN 800 MG PO TABS: 800.0000 mg | ORAL_TABLET | Freq: Four times a day (QID) | ORAL | 2 refills | Status: DC

## 2017-09-18 MED ORDER — CYCLOBENZAPRINE HCL 10 MG PO TABS: 10.0000 mg | ORAL_TABLET | Freq: Every day | ORAL | 2 refills | Status: DC

## 2017-09-18 MED FILL — CYCLOBENZAPRINE 10 MG TAB: 10 | 30 days supply | Qty: 30 | Fill #0

## 2017-09-18 MED FILL — PROMETHAZINE 25 MG TABLET: 25 | 5 days supply | Qty: 20 | Fill #0

## 2017-09-18 NOTE — Patient Instructions (Addendum)
____________________________________________________________________________________________  Medication Rules  Applies to: All patients receiving prescriptions (written or electronic).  Pharmacy of record: Pharmacy where electronic prescriptions will be sent. If written prescriptions are taken to a different pharmacy, please inform the nursing staff. The pharmacy listed in the electronic medical record should be the one where you would like electronic prescriptions to be sent.  Prescription refills: Only during scheduled appointments. Applies to both, written and electronic prescriptions.  NOTE: The following applies primarily to controlled substances (Opioid* Pain Medications).   Patient's responsibilities: 1. Pain Pills: Bring all pain pills to every appointment (except for procedure appointments). 2. Pill Bottles: Bring pills in original pharmacy bottle. Always bring newest bottle. Bring bottle, even if empty. 3. Medication refills: You are responsible for knowing and keeping track of what medications you need refilled. The day before your appointment, write a list of all prescriptions that need to be refilled. Bring that list to your appointment and give it to the admitting nurse. Prescriptions will be written only during appointments. If you forget a medication, it will not be "Called in", "Faxed", or "electronically sent". You will need to get another appointment to get these prescribed. 4. Prescription Accuracy: You are responsible for carefully inspecting your prescriptions before leaving our office. Have the discharge nurse carefully go over each prescription with you, before taking them home. Make sure that your name is accurately spelled, that your address is correct. Check the name and dose of your medication to make sure it is accurate. Check the number of pills, and the written instructions to make sure they are clear and accurate. Make sure that you are given enough medication to last  until your next medication refill appointment. 5. Taking Medication: Take medication as prescribed. Never take more pills than instructed. Never take medication more frequently than prescribed. Taking less pills or less frequently is permitted and encouraged, when it comes to controlled substances (written prescriptions).  6. Inform other Doctors: Always inform, all of your healthcare providers, of all the medications you take. 7. Pain Medication from other Providers: You are not allowed to accept any additional pain medication from any other Doctor or Healthcare provider. There are two exceptions to this rule. (see below) In the event that you require additional pain medication, you are responsible for notifying us, as stated below. 8. Medication Agreement: You are responsible for carefully reading and following our Medication Agreement. This must be signed before receiving any prescriptions from our practice. Safely store a copy of your signed Agreement. Violations to the Agreement will result in no further prescriptions. (Additional copies of our Medication Agreement are available upon request.) 9. Laws, Rules, & Regulations: All patients are expected to follow all Federal and State Laws, Statutes, Rules, & Regulations. Ignorance of the Laws does not constitute a valid excuse. The use of any illegal substances is prohibited. 10. Adopted CDC guidelines & recommendations: Target dosing levels will be at or below 60 MME/day. Use of benzodiazepines** is not recommended.  Exceptions: There are only two exceptions to the rule of not receiving pain medications from other Healthcare Providers. 1. Exception #1 (Emergencies): In the event of an emergency (i.e.: accident requiring emergency care), you are allowed to receive additional pain medication. However, you are responsible for: As soon as you are able, call our office [phone], at any time of the day or night, and leave a message stating your name, the  date and nature of the emergency, and the name and dose of the medication   prescribed. In the event that your call is answered by a member of our staff, make sure to document and save the date, time, and the name of the person that took your information.  2. Exception #2 (Planned Surgery): In the event that you are scheduled by another doctor or dentist to have any type of surgery or procedure, you are allowed (for a period no longer than 30 days), to receive additional pain medication, for the acute post-op pain. However, in this case, you are responsible for picking up a copy of our "Post-op Pain Management for Surgeons" handout, and giving it to your surgeon or dentist. This document is available at our office, and does not require an appointment to obtain it. Simply go to our office during business hours (Monday-Thursday from 8:00 AM to 4:00 PM) (Friday 8:00 AM to 12:00 Noon) or if you have a scheduled appointment with us, prior to your surgery, and ask for it by name. In addition, you will need to provide us with your name, name of your surgeon, type of surgery, and date of procedure or surgery.  *Opioid medications include: morphine, codeine, oxycodone, oxymorphone, hydrocodone, hydromorphone, meperidine, tramadol, tapentadol, buprenorphine, fentanyl, methadone. **Benzodiazepine medications include: diazepam (Valium), alprazolam (Xanax), clonazepam (Klonopine), lorazepam (Ativan), clorazepate (Tranxene), chlordiazepoxide (Librium), estazolam (Prosom), oxazepam (Serax), temazepam (Restoril), triazolam (Halcion) (Last updated: 05/08/2017) ____________________________________________________________________________________________   ____________________________________________________________________________________________  Pain Scale  Introduction: The pain score used by this practice is the Verbal Numerical Rating Scale (VNRS-11). This is an 11-point scale. It is for adults and children 10 years or  older. There are significant differences in how the pain score is reported, used, and applied. Forget everything you learned in the past and learn this scoring system.  General Information: The scale should reflect your current level of pain. Unless you are specifically asked for the level of your worst pain, or your average pain. If you are asked for one of these two, then it should be understood that it is over the past 24 hours.  Basic Activities of Daily Living (ADL): Personal hygiene, dressing, eating, transferring, and using restroom.  Instructions: Most patients tend to report their level of pain as a combination of two factors, their physical pain and their psychosocial pain. This last one is also known as "suffering" and it is reflection of how physical pain affects you socially and psychologically. From now on, report them separately. From this point on, when asked to report your pain level, report only your physical pain. Use the following table for reference.  Pain Clinic Pain Levels (0-5/10)  Pain Level Score  Description  No Pain 0   Mild pain 1 Nagging, annoying, but does not interfere with basic activities of daily living (ADL). Patients are able to eat, bathe, get dressed, toileting (being able to get on and off the toilet and perform personal hygiene functions), transfer (move in and out of bed or a chair without assistance), and maintain continence (able to control bladder and bowel functions). Blood pressure and heart rate are unaffected. A normal heart rate for a healthy adult ranges from 60 to 100 bpm (beats per minute).   Mild to moderate pain 2 Noticeable and distracting. Impossible to hide from other people. More frequent flare-ups. Still possible to adapt and function close to normal. It can be very annoying and may have occasional stronger flare-ups. With discipline, patients may get used to it and adapt.   Moderate pain 3 Interferes significantly with activities of daily  living (ADL).   It becomes difficult to feed, bathe, get dressed, get on and off the toilet or to perform personal hygiene functions. Difficult to get in and out of bed or a chair without assistance. Very distracting. With effort, it can be ignored when deeply involved in activities.   Moderately severe pain 4 Impossible to ignore for more than a few minutes. With effort, patients may still be able to manage work or participate in some social activities. Very difficult to concentrate. Signs of autonomic nervous system discharge are evident: dilated pupils (mydriasis); mild sweating (diaphoresis); sleep interference. Heart rate becomes elevated (>115 bpm). Diastolic blood pressure (lower number) rises above 100 mmHg. Patients find relief in laying down and not moving.   Severe pain 5 Intense and extremely unpleasant. Associated with frowning face and frequent crying. Pain overwhelms the senses.  Ability to do any activity or maintain social relationships becomes significantly limited. Conversation becomes difficult. Pacing back and forth is common, as getting into a comfortable position is nearly impossible. Pain wakes you up from deep sleep. Physical signs will be obvious: pupillary dilation; increased sweating; goosebumps; brisk reflexes; cold, clammy hands and feet; nausea, vomiting or dry heaves; loss of appetite; significant sleep disturbance with inability to fall asleep or to remain asleep. When persistent, significant weight loss is observed due to the complete loss of appetite and sleep deprivation.  Blood pressure and heart rate becomes significantly elevated. Caution: If elevated blood pressure triggers a pounding headache associated with blurred vision, then the patient should immediately seek attention at an urgent or emergency care unit, as these may be signs of an impending stroke.    Emergency Department Pain Levels (6-10/10)  Emergency Room Pain 6 Severely limiting. Requires emergency care  and should not be seen or managed at an outpatient pain management facility. Communication becomes difficult and requires great effort. Assistance to reach the emergency department may be required. Facial flushing and profuse sweating along with potentially dangerous increases in heart rate and blood pressure will be evident.   Distressing pain 7 Self-care is very difficult. Assistance is required to transport, or use restroom. Assistance to reach the emergency department will be required. Tasks requiring coordination, such as bathing and getting dressed become very difficult.   Disabling pain 8 Self-care is no longer possible. At this level, pain is disabling. The individual is unable to do even the most "basic" activities such as walking, eating, bathing, dressing, transferring to a bed, or toileting. Fine motor skills are lost. It is difficult to think clearly.   Incapacitating pain 9 Pain becomes incapacitating. Thought processing is no longer possible. Difficult to remember your own name. Control of movement and coordination are lost.   The worst pain imaginable 10 At this level, most patients pass out from pain. When this level is reached, collapse of the autonomic nervous system occurs, leading to a sudden drop in blood pressure and heart rate. This in turn results in a temporary and dramatic drop in blood flow to the brain, leading to a loss of consciousness. Fainting is one of the body's self defense mechanisms. Passing out puts the brain in a calmed state and causes it to shut down for a while, in order to begin the healing process.    Summary: 1. Refer to this scale when providing us with your pain level. 2. Be accurate and careful when reporting your pain level. This will help with your care. 3. Over-reporting your pain level will lead to loss of credibility. 4. Even   a level of 1/10 means that there is pain and will be treated at our facility. 5. High, inaccurate reporting will be  documented as "Symptom Exaggeration", leading to loss of credibility and suspicions of possible secondary gains such as obtaining more narcotics, or wanting to appear disabled, for fraudulent reasons. 6. Only pain levels of 5 or below will be seen at our facility. 7. Pain levels of 6 and above will be sent to the Emergency Department and the appointment cancelled. ____________________________________________________________________________________________   ____________________________________________________________________________________________  Medication Rules  Applies to: All patients receiving prescriptions (written or electronic).  Pharmacy of record: Pharmacy where electronic prescriptions will be sent. If written prescriptions are taken to a different pharmacy, please inform the nursing staff. The pharmacy listed in the electronic medical record should be the one where you would like electronic prescriptions to be sent.  Prescription refills: Only during scheduled appointments. Applies to both, written and electronic prescriptions.  NOTE: The following applies primarily to controlled substances (Opioid* Pain Medications).   Patient's responsibilities: 11. Pain Pills: Bring all pain pills to every appointment (except for procedure appointments). 12. Pill Bottles: Bring pills in original pharmacy bottle. Always bring newest bottle. Bring bottle, even if empty. 13. Medication refills: You are responsible for knowing and keeping track of what medications you need refilled. The day before your appointment, write a list of all prescriptions that need to be refilled. Bring that list to your appointment and give it to the admitting nurse. Prescriptions will be written only during appointments. If you forget a medication, it will not be "Called in", "Faxed", or "electronically sent". You will need to get another appointment to get these prescribed. 14. Prescription Accuracy: You are  responsible for carefully inspecting your prescriptions before leaving our office. Have the discharge nurse carefully go over each prescription with you, before taking them home. Make sure that your name is accurately spelled, that your address is correct. Check the name and dose of your medication to make sure it is accurate. Check the number of pills, and the written instructions to make sure they are clear and accurate. Make sure that you are given enough medication to last until your next medication refill appointment. 15. Taking Medication: Take medication as prescribed. Never take more pills than instructed. Never take medication more frequently than prescribed. Taking less pills or less frequently is permitted and encouraged, when it comes to controlled substances (written prescriptions).  16. Inform other Doctors: Always inform, all of your healthcare providers, of all the medications you take. 17. Pain Medication from other Providers: You are not allowed to accept any additional pain medication from any other Doctor or Healthcare provider. There are two exceptions to this rule. (see below) In the event that you require additional pain medication, you are responsible for notifying us, as stated below. 18. Medication Agreement: You are responsible for carefully reading and following our Medication Agreement. This must be signed before receiving any prescriptions from our practice. Safely store a copy of your signed Agreement. Violations to the Agreement will result in no further prescriptions. (Additional copies of our Medication Agreement are available upon request.) 19. Laws, Rules, & Regulations: All patients are expected to follow all Federal and [employer], Rules, [employer]. Ignorance of the Laws does not constitute a valid excuse. The use of any illegal substances is prohibited. 20. Adopted CDC guidelines & recommendations: Target dosing levels will be at or below 60 MME/day. Use of  benzodiazepines** is not recommended.  Exceptions:  There are only two exceptions to the rule of not receiving pain medications from other Healthcare Providers. 3. Exception #1 (Emergencies): In the event of an emergency (i.e.: accident requiring emergency care), you are allowed to receive additional pain medication. However, you are responsible for: As soon as you are able, call our office [phone], at any time of the day or night, and leave a message stating your name, the date and nature of the emergency, and the name and dose of the medication prescribed. In the event that your call is answered by a member of our staff, make sure to document and save the date, time, and the name of the person that took your information.  4. Exception #2 (Planned Surgery): In the event that you are scheduled by another doctor or dentist to have any type of surgery or procedure, you are allowed (for a period no longer than 30 days), to receive additional pain medication, for the acute post-op pain. However, in this case, you are responsible for picking up a copy of our "Post-op Pain Management for Surgeons" handout, and giving it to your surgeon or dentist. This document is available at our office, and does not require an appointment to obtain it. Simply go to our office during business hours (Monday-Thursday from 8:00 AM to 4:00 PM) (Friday 8:00 AM to 12:00 Noon) or if you have a scheduled appointment with [place], prior to your surgery, and ask for it by name. In addition, you will need to provide [place] with your name, name of your surgeon, type of surgery, and date of procedure or surgery.  *Opioid medications include: morphine, codeine, oxycodone, oxymorphone, hydrocodone, hydromorphone, meperidine, tramadol, tapentadol, buprenorphine, fentanyl, methadone. **Benzodiazepine medications include: diazepam (Valium), alprazolam (Xanax), clonazepam (Klonopine), lorazepam (Ativan), clorazepate (Tranxene), chlordiazepoxide  (Librium), estazolam (Prosom), oxazepam (Serax), temazepam (Restoril), triazolam (Halcion) (Last updated: 05/08/2017) ____________________________________________________________________________________________

## 2017-09-18 NOTE — Progress Notes (Signed)
Patient's Name: Mark [name]  MRN: [id number]  Referring Provider: Ladell Pier, MD  DOB: 02-Dec-1965  PCP: Mark Pier, MD  DOS: 09/18/2017  Note by: Mark Francois NP  Service setting: Ambulatory outpatient  Specialty: Interventional Pain Management  Location: [facility] (AMB) Pain Management Facility    Patient type: Established    Primary Reason(s) for Visit: Encounter for prescription drug management. (Level of risk: moderate)  CC: Back Pain (low and mid); Neck Pain; Knee Pain (bilateral); Hip Pain (bilateral); and Foot Pain (bilateral)  HPI  Mark [name] is a 52 y.o. year old, male patient, who comes today for a medication management evaluation. 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 ([place]); Alcohol intoxication ([place]); Chronic low back pain (Secondary source of pain) (Bilateral) (R>L); Chronic pain syndrome; Diarrhea; Paroxysmal VT ([place]); Opioid dependence ([place]); Long Q-T syndrome; Hypokalemia; Alcohol use disorder, severe, dependence ([place]); Chronic shoulder pain (Right); Chronic radicular low back pain [facility] source of pain) (Right) (to calf); Chronic fatigue; Vitamin D insufficiency; Poor dentition; Chronic maxillary sinusitis; Erectile dysfunction; Chronic anxiety; GERD (gastroesophageal reflux disease); GAD (generalized anxiety disorder); Alcohol use disorder, severe, in early remission ([place]); Long term current use of opiate analgesic; Long term prescription opiate use; Chronic neck pain (Bilateral)(R>L); Cervical fusion syndrome; Failed back surgical syndrome; Chronic knee pain  (Bilateral) (R>L); Major depressive disorder, recurrent episode, moderate ([place]); Musculoskeletal pain; 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 His primarily concern today is the Back Pain (low and mid); Neck Pain; Knee Pain (bilateral); Hip Pain (bilateral); and Foot Pain (bilateral)  Pain Assessment: Location: Lower Back Radiating: legs, knees , feet Onset: More than a month ago Duration: Chronic pain Quality: Aching, Constant, Burning, Pins and needles Severity: 7 /10 (subjective, self-reported pain score)  Note: Reported level is compatible with observation. Clinically the patient looks like a 2/10 A 2/10 is viewed as "Mild to Moderate" and described as noticeable and distracting. Impossible to hide from other people. More frequent flare-ups. Still possible to adapt and function close to normal. It can be very annoying and may have occasional stronger flare-ups. With discipline, patients may get used to it and adapt. Information on the proper use of the pain scale provided to the patient today. When using our objective Pain Scale, levels between 6 and 10/10 are said to belong in an emergency room, as it progressively worsens from a 6/10, described as severely limiting, requiring emergency care not usually available at an outpatient pain management facility. At a 6/10 level, communication becomes difficult and requires great effort. Assistance to reach the emergency department may be required. Facial flushing and profuse sweating along with potentially dangerous increases in heart rate and blood pressure will be evident. Effect on ADL:   Timing: Constant Modifying factors: medications, sitting, reclining BP: (!) 180/96  HR: 61  Mark [name] was last scheduled for an appointment on 06/19/2017 for medication management. During today's appointment we reviewed Mr.  [name] chronic pain status, as well as his outpatient medication regimen. He admits that since his RFA he is having increased hip pain. He admits that this causes him to have a hard time sleeping. He denies  overuse of his medication.   The patient  reports that he does not use drugs. His body mass index is 26.17 kg/Mark.  Further details on both, my assessment(s), as well as the proposed treatment plan, please see below.  Controlled Substance Pharmacotherapy Assessment REMS (Risk Evaluation and Mitigation Strategy)  Analgesic: Tramadol 100 mg 4 times a day (400 mg/dayof tramadol)  MME/day:62m/day.   Mark Rochester RN  09/18/2017 11:45 AM  Sign at close encounter Nursing Pain Medication Assessment:  Safety precautions to be maintained throughout the outpatient stay will include: orient to surroundings, keep bed in low position, maintain call bell within reach at all times, provide assistance with transfer out of bed and ambulation.  Medication Inspection Compliance: Pill count conducted under aseptic conditions, in front of the patient. Neither the pills nor the bottle was removed from the patient's sight at any time. Once count was completed pills were immediately returned to the patient in their original bottle.  Medication: Tramadol (Ultram) Pill/Patch Count: 25 of 240 pills remain Pill/Patch Appearance: Markings consistent with prescribed medication Bottle Appearance: Standard pharmacy container. Clearly labeled. Filled Date: 06 / 17 / 2019 Last Medication intake:  Today   Pharmacokinetics: Liberation and absorption (onset of action): WNL Distribution (time to peak effect): WNL Metabolism and excretion (duration of action): WNL         Pharmacodynamics: Desired effects: Analgesia: Mr. MAbdelazizreports >50% benefit. Functional ability: Patient reports that medication allows him to accomplish basic ADLs Clinically meaningful improvement in function (CMIF): Sustained CMIF  goals met Perceived effectiveness: Described as relatively effective, allowing for increase in activities of daily living (ADL) Undesirable effects: Side-effects or Adverse reactions: None reported Monitoring: [place] PMP: Online review of the past 167-montheriod conducted. Compliant with practice rules and regulations Last UDS on record: Summary  Date Value Ref Range Status  06/19/2017 FINAL  Final    Comment:    ==================================================================== TOXASSURE SELECT 13 (MW) ==================================================================== Test                             Result       Flag       Units Drug Present and Declared for Prescription Verification   Tramadol                       283          EXPECTED   ng/mg creat   O-Desmethyltramadol            140          EXPECTED   ng/mg creat   N-Desmethyltramadol            167          EXPECTED   ng/mg creat    Source of tramadol is a prescription medication.    O-desmethyltramadol and N-desmethyltramadol are expected    metabolites of tramadol. Drug Present not Declared for Prescription Verification   Alprazolam                     101          UNEXPECTED ng/mg creat   Alpha-hydroxyalprazolam        164          UNEXPECTED ng/mg creat    Source of alprazolam is a scheduled prescription medication.    Alpha-hydroxyalprazolam is an expected metabolite of alprazolam. ==================================================================== Test  Result    Flag   Units      Ref Range   Creatinine              371              mg/dL      >=20 ==================================================================== Declared Medications:  The flagging and interpretation on this report are based on the  following declared medications.  Unexpected results may arise from  inaccuracies in the declared medications.  **Note: The testing scope of this panel includes these medications:  Tramadol   **Note: The testing scope of this panel does not include following  reported medications:  Albuterol  Atorvastatin  Cyclobenzaprine  Gabapentin  Losartan (Losartan Potassium)  Metoprolol  Tadalafil  Tamsulosin  Trazodone ==================================================================== For clinical consultation, please call [phone]. ====================================================================    UDS interpretation: Compliant          Medication Assessment Form: Reviewed. Patient indicates being compliant with therapy Treatment compliance: Compliant Risk Assessment Profile: Aberrant behavior: See prior evaluations. None observed or detected today Comorbid factors increasing risk of overdose: See prior notes. No additional risks detected today Risk of substance use disorder (SUD): Low Opioid Risk Tool - 09/18/17 1142      Family History of Substance Abuse   Alcohol  Positive Male    Illegal Drugs  Negative    Rx Drugs  Negative      Personal History of Substance Abuse   Alcohol  Positive Male or Male    Illegal Drugs  Negative    Rx Drugs  Negative      Age   Age between 27-45 years   No      History of Preadolescent Sexual Abuse   History of Preadolescent Sexual Abuse  Negative or Male      Psychological Disease   Psychological Disease  Negative    Depression  Negative      Total Score   Opioid Risk Tool Scoring  6    Opioid Risk Interpretation  Moderate Risk      ORT Scoring interpretation table:  Score <3 = Low Risk for SUD  Score between 4-7 = Moderate Risk for SUD  Score >8 = High Risk for Opioid Abuse   Risk Mitigation Strategies:  Patient Counseling: Covered Patient-Prescriber Agreement (PPA): Present and active  Notification to other healthcare providers: Done  Pharmacologic Plan: No change in therapy, at this time.             Laboratory Chemistry  Inflammation Markers (CRP: Acute Phase) (ESR: Chronic Phase) Lab Results   Component Value Date   CRP 0.9 03/18/2016   ESRSEDRATE 2 03/18/2016   LATICACIDVEN 1.16 02/06/2015                         Rheumatology Markers Lab Results  Component Value Date   LABURIC 5.7 12/09/2011                        Renal Function Markers Lab Results  Component Value Date   BUN 7 09/08/2017   CREATININE 0.85 09/08/2017   BCR 12 04/09/2017   GFRAA >60 09/08/2017   GFRNONAA >60 09/08/2017                             Hepatic Function Markers Lab Results  Component Value Date   AST 24 09/08/2017  ALT 20 09/08/2017   ALBUMIN 4.2 09/08/2017   ALKPHOS 96 09/08/2017   HCVAB NEGATIVE 06/14/2012   AMYLASE 26 12/28/2012   LIPASE 27 09/08/2017   AMMONIA 27 06/27/2010                        Electrolytes Lab Results  Component Value Date   NA 138 09/08/2017   K 4.5 09/08/2017   CL 102 09/08/2017   CALCIUM 9.6 09/08/2017   MG 2.1 03/18/2016   PHOS 4.1 06/25/2012                        Neuropathy Markers Lab Results  Component Value Date   VITAMINB12 244 03/18/2016   FOLATE 3.0 (L) 06/07/2013   HGBA1C 6.0 04/03/2015   HIV NON REACTIVE 06/12/2012                        Bone Pathology Markers Lab Results  Component Value Date   VD25OH 21 (L) 07/04/2014   25OHVITD1 35 03/18/2016   25OHVITD2 1.0 03/18/2016   25OHVITD3 34 03/18/2016   TESTOFREE 78.9 09/16/2014   TESTOSTERONE 365 09/16/2014                         Coagulation Parameters Lab Results  Component Value Date   INR 1.04 06/07/2013   LABPROT 13.4 06/07/2013   PLT 286 09/08/2017   DDIMER <0.27 11/13/2015                        Cardiovascular Markers Lab Results  Component Value Date   BNP 23.9 02/27/2015   CKTOTAL 201 01/22/2012   CKMB 2.5 01/22/2012   TROPONINI <0.03 11/07/2015   HGB 17.2 (H) 09/08/2017   HCT 52.0 09/08/2017                         CA Markers No results found for: CEA, CA125, LABCA2                      Note: Lab results reviewed.  Recent Diagnostic Imaging  Results  CT ABDOMEN PELVIS W CONTRAST CLINICAL DATA:  52 year old male with diffuse abdominal pain, progressed for 4 days. Dark watery stools yesterday.  EXAM: CT ABDOMEN AND PELVIS WITH CONTRAST  TECHNIQUE: Multidetector CT imaging of the abdomen and pelvis was performed using the standard protocol following bolus administration of intravenous contrast.  CONTRAST:  139m OMNIPAQUE IOHEXOL 300 MG/ML  SOLN  COMPARISON:  Lumbar MRI 05/22/2017. CT Abdomen and Pelvis 04/12/2014.  FINDINGS: Lower chest: Centrilobular emphysema in the lower lobes. Chronic posterior basal segment lower lobe scarring and mild architectural distortion is stable. No pericardial or pleural effusion.  Hepatobiliary: Mild hepatic steatosis. Otherwise negative liver and gallbladder.  Pancreas: Negative.  Spleen: Negative.  Adrenals/Urinary Tract: Normal adrenal glands. Symmetric and normal bilateral renal enhancement and contrast excretion. Both ureters are normal.  Diminutive and unremarkable urinary bladder.  Stomach/Bowel:  Negative rectum. The sigmoid colon appears stable since 2016 with minimal diverticula, no active inflammation.  Negative left colon and transverse colon. The right colon is decompressed and within normal limits. The appendix is normal (coronal image 28). The terminal ileum is decompressed and negative.  No dilated or abnormal small bowel. Chronic postoperative changes about the gastric fundus. Otherwise negative stomach and duodenum.  No mesenteric  inflammation identified. No abdominal free air, free fluid.  Vascular/Lymphatic: Aortoiliac calcified atherosclerosis. The major arterial structures in the abdomen and pelvis are patent. Grossly patent portal venous system.  No lymphadenopathy.  Reproductive: Negative.  Other: No pelvic free fluid.  Musculoskeletal: Chronic lower lumbar spine interbody fusion implants. Chronic bilateral femoral head AVN. No acute  osseous abnormality identified.  IMPRESSION: 1. No acute or inflammatory process identified in the abdomen or pelvis. 2. Aortic Atherosclerosis (ICD10-I70.0). Pulmonary lower lobe Emphysema (ICD10-J43.9). 3. Mild chronic hepatic steatosis.  Chronic femoral head AVN.  Electronically Signed   By: Genevie Ann Mark.D.   On: 09/08/2017 13:10  Complexity Note: Imaging results reviewed. Results shared with Mark [name], using Layman's terms.                         Meds   Current Outpatient Medications:  .  acetaminophen (TYLENOL) 500 MG tablet, Take 1,000 mg by mouth as needed for headache., Disp: , Rfl:  .  albuterol (VENTOLIN HFA) 108 (90 Base) MCG/ACT inhaler, Inhale 2 puffs into the lungs every 6 (six) hours as needed for wheezing or shortness of breath., Disp: 18 g, Rfl: 3 .  cyclobenzaprine (FLEXERIL) 10 MG tablet, Take 1 tablet (10 mg total) by mouth at bedtime., Disp: 30 tablet, Rfl: 2 .  esomeprazole (NEXIUM) 40 MG capsule, Take 1 capsule (40 mg total) by mouth daily., Disp: 30 capsule, Rfl: 0 .  gabapentin (NEURONTIN) 800 MG tablet, Take 1 tablet (800 mg total) by mouth 4 (four) times daily. (Patient taking differently: Take 800 mg by mouth 3 (three) times daily. ), Disp: 120 tablet, Rfl: 2 .  losartan (COZAAR) 50 MG tablet, Take 50 mg by mouth daily., Disp: , Rfl: 3 .  metoprolol (LOPRESSOR) 50 MG tablet, Take 1 tablet (50 mg total) by mouth 2 (two) times daily., Disp: 60 tablet, Rfl: 11 .  promethazine (PHENERGAN) 25 MG tablet, Take 1 tablet (25 mg total) by mouth every 8 (eight) hours as needed for nausea or vomiting., Disp: 6 tablet, Rfl: 0 .  traMADol (ULTRAM) 50 MG tablet, Take 2 tablets (100 mg total) by mouth 4 (four) times daily. 1-2 tablets, Disp: 240 tablet, Rfl: 2 .  traZODone (DESYREL) 100 MG tablet, Take 1 tablet (100 mg total) by mouth at bedtime as needed., Disp: 30 tablet, Rfl: 3 .  acetaminophen-codeine (TYLENOL #3) 300-30 MG tablet, Take 1 tablet by mouth every 8 (eight)  hours as needed for moderate pain. (Patient not taking: Reported on 09/08/2017), Disp: 90 tablet, Rfl: 0 .  acetaminophen-codeine (TYLENOL #3) 300-30 MG tablet, Take 1 tablet by mouth every 8 (eight) hours as needed for moderate pain. (Patient not taking: Reported on 09/08/2017), Disp: 90 tablet, Rfl: 0 .  atorvastatin (LIPITOR) 20 MG tablet, Take 1 tablet (20 mg total) by mouth daily. (Patient not taking: Reported on 09/18/2017), Disp: 30 tablet, Rfl: 3 .  CIALIS 5 MG tablet, TAKE 1 TABLET BY MOUTH DAILY. (Patient not taking: Reported on 09/08/2017), Disp: 30 tablet, Rfl: 3 .  tamsulosin (FLOMAX) 0.4 MG CAPS capsule, Take 1 capsule (0.4 mg total) by mouth daily after supper. (Patient not taking: Reported on 09/08/2017), Disp: 30 capsule, Rfl: 3  ROS  Constitutional: Denies any fever or chills Gastrointestinal: No reported hemesis, hematochezia, vomiting, or acute GI distress Musculoskeletal: Denies any acute onset joint swelling, redness, loss of ROM, or weakness Neurological: No reported episodes of acute onset apraxia, aphasia, dysarthria, agnosia, amnesia, paralysis,  loss of coordination, or loss of consciousness  Allergies  Mark [name] is allergic to cozaar [losartan] and lisinopril.  [place]  Drug: Mark [name]  reports that he does not use drugs. Alcohol:  reports that he does not drink alcohol. Tobacco:  reports that he has been smoking cigarettes.  He has a 15.00 pack-year smoking history. He has never used smokeless tobacco. Medical:  has a past medical history of Alcohol withdrawal ([place]), Allergy, Anxiety (Dx 2014), COPD (chronic obstructive pulmonary disease) ([place]), Drug abuse ([place]), Enlarged prostate, ETOH abuse, GERD (gastroesophageal reflux disease) (Dx 2003), Headache(784.0), Hepatitis, Hyperlipidemia (Dx 2000), Hypertension (Dx 2011), IBS (irritable bowel syndrome), Irregular heart beat, Long Q-T syndrome (01/23/2014), Mental disorder, Neuromuscular disorder ([place]), Neuropathy (06/07/2013),  Seizure due to alcohol withdrawal ([place]) (01/23/2014), Shortness of breath, and Withdrawal seizures ([place]) (2014). Surgical: Mark [name]  has a past surgical history that includes Back surgery (1995, 1999); Neck surgery (2000); Nasal sinus surgery; left heart catheterization with coronary angiogram (N/A, 01/13/2014); Subacromial decompression (Right, 11/16/2014); Nissen fundoplication; 24 hour ph study (N/A, 01/16/2015); Upper gastrointestinal endoscopy; and Colonoscopy (1998). Family: family history includes Alcohol abuse in his father and paternal grandfather; Breast cancer in his paternal grandmother; Colon polyps in his mother; Hypertension in his mother; Lung cancer in his father.  Constitutional Exam  General appearance: Well nourished, well developed, and well hydrated. In no apparent acute distress Vitals:   09/18/17 1134  BP: (!) 180/96  Pulse: 61  Resp: 18  Temp: 97.8 F (36.6 C)  TempSrc: Oral  SpO2: 100%  Weight: 215 lb (97.5 kg)  Height: _0  (1.93 Mark)   BMI Assessment: Estimated body mass index is 26.17 kg/Mark as calculated from the following:   Height as of this encounter: _1  (1.93 Mark).   Weight as of this encounter: 215 lb (97.5 kg). Psych/Mental status: Alert, oriented x 3 (person, place, & time)       Eyes: PERLA Respiratory: No evidence of acute respiratory distress  Cervical Spine Area Exam  Skin & Axial Inspection: No masses, redness, edema, swelling, or associated skin lesions Alignment: Symmetrical Functional ROM: Unrestricted ROM      Stability: No instability detected Muscle Tone/Strength: Functionally intact. No obvious neuro-muscular anomalies detected. Sensory (Neurological): Unimpaired Palpation: No palpable anomalies              Upper Extremity (UE) Exam    Side: Right upper extremity  Side: Left upper extremity  Skin & Extremity Inspection: Skin color, temperature, and hair growth are WNL. No peripheral edema or cyanosis. No masses, redness, swelling,  asymmetry, or associated skin lesions. No contractures.  Skin & Extremity Inspection: Skin color, temperature, and hair growth are WNL. No peripheral edema or cyanosis. No masses, redness, swelling, asymmetry, or associated skin lesions. No contractures.  Functional ROM: Unrestricted ROM          Functional ROM: Unrestricted ROM          Muscle Tone/Strength: Functionally intact. No obvious neuro-muscular anomalies detected.  Muscle Tone/Strength: Functionally intact. No obvious neuro-muscular anomalies detected.  Sensory (Neurological): Unimpaired          Sensory (Neurological): Unimpaired          Palpation: No palpable anomalies              Palpation: No palpable anomalies              Provocative Test(s):  Phalen's test: deferred Tinel's test: deferred Apley's scratch test (touch opposite shoulder):  Action  1 (Across chest): deferred Action 2 (Overhead): deferred Action 3 (LB reach): deferred   Provocative Test(s):  Phalen's test: deferred Tinel's test: deferred Apley's scratch test (touch opposite shoulder):  Action 1 (Across chest): deferred Action 2 (Overhead): deferred Action 3 (LB reach): deferred    Thoracic Spine Area Exam  Skin & Axial Inspection: No masses, redness, or swelling Alignment: Symmetrical Functional ROM: Unrestricted ROM Stability: No instability detected Muscle Tone/Strength: Functionally intact. No obvious neuro-muscular anomalies detected. Sensory (Neurological): Unimpaired Muscle strength & Tone: No palpable anomalies  Lumbar Spine Area Exam  Skin & Axial Inspection: No masses, redness, or swelling Alignment: Symmetrical Functional ROM: Unrestricted ROM       Stability: No instability detected Muscle Tone/Strength: Functionally intact. No obvious neuro-muscular anomalies detected. Sensory (Neurological): Unimpaired Palpation: No palpable anomalies       Provocative Tests: Lumbar Hyperextension/rotation test: deferred today       Lumbar quadrant  test (Kemp's test): deferred today       Lumbar Lateral bending test: deferred today       Patrick's Maneuver: deferred today                   FABER test: deferred today                   Thigh-thrust test: deferred today       S-I compression test: deferred today       S-I distraction test: deferred today        Gait & Posture Assessment  Ambulation: Unassisted Gait: Relatively normal for age and body habitus Posture: WNL   Lower Extremity Exam    Side: Right lower extremity  Side: Left lower extremity  Stability: No instability observed          Stability: No instability observed          Skin & Extremity Inspection: Skin color, temperature, and hair growth are WNL. No peripheral edema or cyanosis. No masses, redness, swelling, asymmetry, or associated skin lesions. No contractures.  Skin & Extremity Inspection: Skin color, temperature, and hair growth are WNL. No peripheral edema or cyanosis. No masses, redness, swelling, asymmetry, or associated skin lesions. No contractures.  Functional ROM: Unrestricted ROM                  Functional ROM: Unrestricted ROM                  Muscle Tone/Strength: Functionally intact. No obvious neuro-muscular anomalies detected.  Muscle Tone/Strength: Functionally intact. No obvious neuro-muscular anomalies detected.  Sensory (Neurological): Unimpaired  Sensory (Neurological): Unimpaired  Palpation: No palpable anomalies  Palpation: No palpable anomalies   Assessment  Primary Diagnosis & Pertinent Problem List: The primary encounter diagnosis was Lower extremity neuropathy (Primary Source of Pain) (Bilateral) (R>L). Diagnoses of Chronic sacroiliac joint pain (Bilateral) (R>L), Lumbar facet syndrome (Bilateral) (R>L), and Musculoskeletal pain were also pertinent to this visit.  Status Diagnosis  Persistent Persistent Persistent 1. 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 Chronic sacroiliac joint pain (Bilateral)  (R>L)   3. Lumbar facet syndrome (Bilateral) (R>L)   4. Musculoskeletal pain     Problems updated and reviewed during this visit: No problems updated. Plan of Care  Pharmacotherapy (Medications Ordered): No orders of the defined types were placed in this encounter.  New Prescriptions   No medications on file   Medications administered today: [name] had no medications administered during this visit.  Lab-work, procedure(s), and/or referral(s): No orders of the defined types were placed in this encounter.  Imaging and/or referral(s): None  Interventional therapies: Planned, scheduled, and/or pending:  Not at this time   Considering:  Lower extremity EMG/PNCV. (Radiculopathy versus peripheral neuropathy) Upper extremity EMG/PNCV.  Possibleright-sided L3 + L4 Lumbarsympathetic RFA Possible Racz procedure Diagnostic cervical epidural steroidinjection  Diagnostic bilateral cervical facet block  Possible bilateral cervical facet RFA Diagnostic bilateral lumbar facet block Possible bilateral lumbar facet RFA Diagnostic right intra-articular shoulder joint injection Diagnostic right suprascapular nerveblock Possibleright suprascapular RFA Diagnostic bilateral intra-articular knee joint injection Possible bilateral series of 5 [place] knee injections Diagnostic bilateral Genicular nerve block Possible bilateral Genicular RFA Diagnostic bilateral greater occipital nerve block Possible bilateral greater occipital nerve RFA Diagnostic bilateral C2 +TON nerve block Possible bilateral C2 + TON RFA   Palliative PRN treatment(s):  Diagnostic Caudal ESI#3 Diagnostic right-sided L3 + L4 lumbar sympathetic block#3       Provider-requested follow-up: Return in about 3 months (around 12/19/2017) for MedMgmt with Me Donella Stade Edison Pace).  Future Appointments  Date Time Provider [place]  09/26/2017  8:45 AM Levin Erp, PA [facility]  [facility]  11/13/2017  2:30 PM Mark Pier, MD [facility] None   Primary Care Physician: Mark Pier, MD Location: [facility] Facility Note by: Mark Francois NP Date: 09/18/2017; Time: 11:56 AM  Pain Score Disclaimer: We use the NRS-11 scale. This is a self-reported, subjective measurement of pain severity with only modest accuracy. It is used primarily to identify changes within a particular patient. It must be understood that outpatient pain scales are significantly less accurate that those used for research, where they can be applied under ideal controlled circumstances with minimal exposure to variables. In reality, the score is likely to be a combination of pain intensity and pain affect, where pain affect describes the degree of emotional arousal or changes in action readiness caused by the sensory experience of pain. Factors such as social and work situation, setting, emotional state, anxiety levels, expectation, and prior pain experience may influence pain perception and show large inter-individual differences that may also be affected by time variables.  Patient instructions provided during this appointment: Patient Instructions   ____________________________________________________________________________________________  Medication Rules  Applies to: All patients receiving prescriptions (written or electronic).  Pharmacy of record: Pharmacy where electronic prescriptions will be sent. If written prescriptions are taken to a different pharmacy, please inform the nursing staff. The pharmacy listed in the electronic medical record should be the one where you would like electronic prescriptions to be sent.  Prescription refills: Only during scheduled appointments. Applies to both, written and electronic prescriptions.  NOTE: The following applies primarily to controlled substances (Opioid* Pain Medications).   Patient's responsibilities: 1. Pain  Pills: Bring all pain pills to every appointment (except for procedure appointments). 2. Pill Bottles: Bring pills in original pharmacy bottle. Always bring newest bottle. Bring bottle, even if empty. 3. Medication refills: You are responsible for knowing and keeping track of what medications you need refilled. The day before your appointment, write a list of all prescriptions that need to be refilled. Bring that list to your appointment and give it to the admitting nurse. Prescriptions will be written only during appointments. If you forget a medication, it will not be "Called in", "Faxed", or "electronically sent". You will need to get another appointment to get these prescribed. 4. Prescription Accuracy: You are responsible for carefully inspecting your prescriptions before leaving our  office. Have the discharge nurse carefully go over each prescription with you, before taking them home. Make sure that your name is accurately spelled, that your address is correct. Check the name and dose of your medication to make sure it is accurate. Check the number of pills, and the written instructions to make sure they are clear and accurate. Make sure that you are given enough medication to last until your next medication refill appointment. 5. Taking Medication: Take medication as prescribed. Never take more pills than instructed. Never take medication more frequently than prescribed. Taking less pills or less frequently is permitted and encouraged, when it comes to controlled substances (written prescriptions).  6. Inform other Doctors: Always inform, all of your healthcare providers, of all the medications you take. 7. Pain Medication from other Providers: You are not allowed to accept any additional pain medication from any other Doctor or Healthcare provider. There are two exceptions to this rule. (see below) In the event that you require additional pain medication, you are responsible for notifying us, as stated  below. 8. Medication Agreement: You are responsible for carefully reading and following our Medication Agreement. This must be signed before receiving any prescriptions from our practice. Safely store a copy of your signed Agreement. Violations to the Agreement will result in no further prescriptions. (Additional copies of our Medication Agreement are available upon request.) 9. Laws, Rules, & Regulations: All patients are expected to follow all Federal and [employer], [employer], Rules, [employer]. Ignorance of the Laws does not constitute a valid excuse. The use of any illegal substances is prohibited. 10. Adopted CDC guidelines & recommendations: Target dosing levels will be at or below 60 MME/day. Use of benzodiazepines** is not recommended.  Exceptions: There are only two exceptions to the rule of not receiving pain medications from other Healthcare Providers. 1. Exception #1 (Emergencies): In the event of an emergency (i.e.: accident requiring emergency care), you are allowed to receive additional pain medication. However, you are responsible for: As soon as you are able, call our office [phone], at any time of the day or night, and leave a message stating your name, the date and nature of the emergency, and the name and dose of the medication prescribed. In the event that your call is answered by a member of our staff, make sure to document and save the date, time, and the name of the person that took your information.  2. Exception #2 (Planned Surgery): In the event that you are scheduled by another doctor or dentist to have any type of surgery or procedure, you are allowed (for a period no longer than 30 days), to receive additional pain medication, for the acute post-op pain. However, in this case, you are responsible for picking up a copy of our "Post-op Pain Management for Surgeons" handout, and giving it to your surgeon or dentist. This document is available at our office, and does not  require an appointment to obtain it. Simply go to our office during business hours (Monday-Thursday from 8:00 AM to 4:00 PM) (Friday 8:00 AM to 12:00 Noon) or if you have a scheduled appointment with [place], prior to your surgery, and ask for it by name. In addition, you will need to provide [place] with your name, name of your surgeon, type of surgery, and date of procedure or surgery.  *Opioid medications include: morphine, codeine, oxycodone, oxymorphone, hydrocodone, hydromorphone, meperidine, tramadol, tapentadol, buprenorphine, fentanyl, methadone. **Benzodiazepine medications include: diazepam (Valium), alprazolam (Xanax), clonazepam (Klonopine), lorazepam (  Ativan), clorazepate (Tranxene), chlordiazepoxide (Librium), estazolam (Prosom), oxazepam (Serax), temazepam (Restoril), triazolam (Halcion) (Last updated: 05/08/2017) ____________________________________________________________________________________________   ____________________________________________________________________________________________  Pain Scale  Introduction: The pain score used by this practice is the Verbal Numerical Rating Scale (VNRS-11). This is an 11-point scale. It is for adults and children 10 years or older. There are significant differences in how the pain score is reported, used, and applied. Forget everything you learned in the past and learn this scoring system.  General Information: The scale should reflect your current level of pain. Unless you are specifically asked for the level of your worst pain, or your average pain. If you are asked for one of these two, then it should be understood that it is over the past 24 hours.  Basic Activities of Daily Living (ADL): Personal hygiene, dressing, eating, transferring, and using restroom.  Instructions: Most patients tend to report their level of pain as a combination of two factors, their physical pain and their psychosocial pain. This last one is also known as  "suffering" and it is reflection of how physical pain affects you socially and psychologically. From now on, report them separately. From this point on, when asked to report your pain level, report only your physical pain. Use the following table for reference.  Pain Clinic Pain Levels (0-5/10)  Pain Level Score  Description  No Pain 0   Mild pain 1 Nagging, annoying, but does not interfere with basic activities of daily living (ADL). Patients are able to eat, bathe, get dressed, toileting (being able to get on and off the toilet and perform personal hygiene functions), transfer (move in and out of bed or a chair without assistance), and maintain continence (able to control bladder and bowel functions). Blood pressure and heart rate are unaffected. A normal heart rate for a healthy adult ranges from 60 to 100 bpm (beats per minute).   Mild to moderate pain 2 Noticeable and distracting. Impossible to hide from other people. More frequent flare-ups. Still possible to adapt and function close to normal. It can be very annoying and may have occasional stronger flare-ups. With discipline, patients may get used to it and adapt.   Moderate pain 3 Interferes significantly with activities of daily living (ADL). It becomes difficult to feed, bathe, get dressed, get on and off the toilet or to perform personal hygiene functions. Difficult to get in and out of bed or a chair without assistance. Very distracting. With effort, it can be ignored when deeply involved in activities.   Moderately severe pain 4 Impossible to ignore for more than a few minutes. With effort, patients may still be able to manage work or participate in some social activities. Very difficult to concentrate. Signs of autonomic nervous system discharge are evident: dilated pupils (mydriasis); mild sweating (diaphoresis); sleep interference. Heart rate becomes elevated (>115 bpm). Diastolic blood pressure (lower number) rises above 100 mmHg.  Patients find relief in laying down and not moving.   Severe pain 5 Intense and extremely unpleasant. Associated with frowning face and frequent crying. Pain overwhelms the senses.  Ability to do any activity or maintain social relationships becomes significantly limited. Conversation becomes difficult. Pacing back and forth is common, as getting into a comfortable position is nearly impossible. Pain wakes you up from deep sleep. Physical signs will be obvious: pupillary dilation; increased sweating; goosebumps; brisk reflexes; cold, clammy hands and feet; nausea, vomiting or dry heaves; loss of appetite; significant sleep disturbance with inability to fall asleep or to remain  asleep. When persistent, significant weight loss is observed due to the complete loss of appetite and sleep deprivation.  Blood pressure and heart rate becomes significantly elevated. Caution: If elevated blood pressure triggers a pounding headache associated with blurred vision, then the patient should immediately seek attention at an urgent or emergency care unit, as these may be signs of an impending stroke.    Emergency Department Pain Levels (6-10/10)  Emergency Room Pain 6 Severely limiting. Requires emergency care and should not be seen or managed at an outpatient pain management facility. Communication becomes difficult and requires great effort. Assistance to reach the emergency department may be required. Facial flushing and profuse sweating along with potentially dangerous increases in heart rate and blood pressure will be evident.   Distressing pain 7 Self-care is very difficult. Assistance is required to transport, or use restroom. Assistance to reach the emergency department will be required. Tasks requiring coordination, such as bathing and getting dressed become very difficult.   Disabling pain 8 Self-care is no longer possible. At this level, pain is disabling. The individual is unable to do even the most "basic"  activities such as walking, eating, bathing, dressing, transferring to a bed, or toileting. Fine motor skills are lost. It is difficult to think clearly.   Incapacitating pain 9 Pain becomes incapacitating. Thought processing is no longer possible. Difficult to remember your own name. Control of movement and coordination are lost.   The worst pain imaginable 10 At this level, most patients pass out from pain. When this level is reached, collapse of the autonomic nervous system occurs, leading to a sudden drop in blood pressure and heart rate. This in turn results in a temporary and dramatic drop in blood flow to the brain, leading to a loss of consciousness. Fainting is one of the body's self defense mechanisms. Passing out puts the brain in a calmed state and causes it to shut down for a while, in order to begin the healing process.    Summary: 1. Refer to this scale when providing [place] with your pain level. 2. Be accurate and careful when reporting your pain level. This will help with your care. 3. Over-reporting your pain level will lead to loss of credibility. 4. Even a level of 1/10 means that there is pain and will be treated at our facility. 5. High, inaccurate reporting will be documented as "Symptom Exaggeration", leading to loss of credibility and suspicions of possible secondary gains such as obtaining more narcotics, or wanting to appear disabled, for fraudulent reasons. 6. Only pain levels of 5 or below will be seen at our facility. 7. Pain levels of 6 and above will be sent to the Emergency Department and the appointment cancelled. ____________________________________________________________________________________________   ____________________________________________________________________________________________  Medication Rules  Applies to: All patients receiving prescriptions (written or electronic).  Pharmacy of record: Pharmacy where electronic prescriptions will be sent.  If written prescriptions are taken to a different pharmacy, please inform the nursing staff. The pharmacy listed in the electronic medical record should be the one where you would like electronic prescriptions to be sent.  Prescription refills: Only during scheduled appointments. Applies to both, written and electronic prescriptions.  NOTE: The following applies primarily to controlled substances (Opioid* Pain Medications).   Patient's responsibilities: 11. Pain Pills: Bring all pain pills to every appointment (except for procedure appointments). 12. Pill Bottles: Bring pills in original pharmacy bottle. Always bring newest bottle. Bring bottle, even if empty. 13. Medication refills: You are responsible for  knowing and keeping track of what medications you need refilled. The day before your appointment, write a list of all prescriptions that need to be refilled. Bring that list to your appointment and give it to the admitting nurse. Prescriptions will be written only during appointments. If you forget a medication, it will not be "Called in", "Faxed", or "electronically sent". You will need to get another appointment to get these prescribed. 14. Prescription Accuracy: You are responsible for carefully inspecting your prescriptions before leaving our office. Have the discharge nurse carefully go over each prescription with you, before taking them home. Make sure that your name is accurately spelled, that your address is correct. Check the name and dose of your medication to make sure it is accurate. Check the number of pills, and the written instructions to make sure they are clear and accurate. Make sure that you are given enough medication to last until your next medication refill appointment. 15. Taking Medication: Take medication as prescribed. Never take more pills than instructed. Never take medication more frequently than prescribed. Taking less pills or less frequently is permitted and encouraged,  when it comes to controlled substances (written prescriptions).  16. Inform other Doctors: Always inform, all of your healthcare providers, of all the medications you take. 17. Pain Medication from other Providers: You are not allowed to accept any additional pain medication from any other Doctor or Healthcare provider. There are two exceptions to this rule. (see below) In the event that you require additional pain medication, you are responsible for notifying us, as stated below. 18. Medication Agreement: You are responsible for carefully reading and following our Medication Agreement. This must be signed before receiving any prescriptions from our practice. Safely store a copy of your signed Agreement. Violations to the Agreement will result in no further prescriptions. (Additional copies of our Medication Agreement are available upon request.) 19. Laws, Rules, & Regulations: All patients are expected to follow all Federal and [employer], [employer], Rules, [employer]. Ignorance of the Laws does not constitute a valid excuse. The use of any illegal substances is prohibited. 20. Adopted CDC guidelines & recommendations: Target dosing levels will be at or below 60 MME/day. Use of benzodiazepines** is not recommended.  Exceptions: There are only two exceptions to the rule of not receiving pain medications from other Healthcare Providers. 3. Exception #1 (Emergencies): In the event of an emergency (i.e.: accident requiring emergency care), you are allowed to receive additional pain medication. However, you are responsible for: As soon as you are able, call our office [phone], at any time of the day or night, and leave a message stating your name, the date and nature of the emergency, and the name and dose of the medication prescribed. In the event that your call is answered by a member of our staff, make sure to document and save the date, time, and the name of the person that took your information.   4. Exception #2 (Planned Surgery): In the event that you are scheduled by another doctor or dentist to have any type of surgery or procedure, you are allowed (for a period no longer than 30 days), to receive additional pain medication, for the acute post-op pain. However, in this case, you are responsible for picking up a copy of our "Post-op Pain Management for Surgeons" handout, and giving it to your surgeon or dentist. This document is available at our office, and does not require an appointment to obtain it. Simply go to our  office during business hours (Monday-Thursday from 8:00 AM to 4:00 PM) (Friday 8:00 AM to 12:00 Noon) or if you have a scheduled appointment with [place], prior to your surgery, and ask for it by name. In addition, you will need to provide [place] with your name, name of your surgeon, type of surgery, and date of procedure or surgery.  *Opioid medications include: morphine, codeine, oxycodone, oxymorphone, hydrocodone, hydromorphone, meperidine, tramadol, tapentadol, buprenorphine, fentanyl, methadone. **Benzodiazepine medications include: diazepam (Valium), alprazolam (Xanax), clonazepam (Klonopine), lorazepam (Ativan), clorazepate (Tranxene), chlordiazepoxide (Librium), estazolam (Prosom), oxazepam (Serax), temazepam (Restoril), triazolam (Halcion) (Last updated: 05/08/2017) ____________________________________________________________________________________________

## 2017-09-18 NOTE — Progress Notes (Signed)
Nursing Pain Medication Assessment:  Safety precautions to be maintained throughout the outpatient stay will include: orient to surroundings, keep bed in low position, maintain call bell within reach at all times, provide assistance with transfer out of bed and ambulation.  Medication Inspection Compliance: Pill count conducted under aseptic conditions, in front of the patient. Neither the pills nor the bottle was removed from the patient's sight at any time. Once count was completed pills were immediately returned to the patient in their original bottle.  Medication: Tramadol (Ultram) Pill/Patch Count: 25 of 240 pills remain Pill/Patch Appearance: Markings consistent with prescribed medication Bottle Appearance: Standard pharmacy container. Clearly labeled. Filled Date: 06 / 17 / 2019 Last Medication intake:  Today

## 2017-09-23 MED FILL — traMADol HCL 50 MG TABS: 50 | 30 days supply | Qty: 240 | Fill #0

## 2017-09-26 ENCOUNTER — Ambulatory Visit: Payer: Self-pay | Admitting: Physician Assistant

## 2017-10-15 ENCOUNTER — Other Ambulatory Visit: Payer: Self-pay | Admitting: Internal Medicine

## 2017-10-15 MED FILL — GABAPENTIN 800 MG TABLET: 800 | 30 days supply | Qty: 120 | Fill #0

## 2017-10-15 MED FILL — $CIALIS 5 MG TABLET: 5 | 30 days supply | Qty: 30 | Fill #0

## 2017-10-16 MED FILL — METOPROLOL TARTRATE 50 MG T: 50 | 30 days supply | Qty: 90 | Fill #0

## 2017-10-17 ENCOUNTER — Telehealth: Payer: Self-pay | Admitting: Internal Medicine

## 2017-10-17 NOTE — Telephone Encounter (Signed)
Pt got an RX for Tramadol #240 tabs from pain management on 09/23/17, he is now requesting a refill of Tylenol #3 from [place].

## 2017-10-17 NOTE — Telephone Encounter (Signed)
Patient called requesting refill on  acetaminophen-codeine (TYLENOL #3) 300-30 MG tablet [[id number]] Patient uses [facility] pharmacy.

## 2017-10-20 NOTE — Telephone Encounter (Signed)
Contacted pt to go over Dr. Wynetta Emery message pt was not home left a message with a lady asking to give pt a message to give Mark [name] @[email or web address]  a call

## 2017-10-22 MED FILL — traMADol HCL 50 MG TABS: 50 | 30 days supply | Qty: 240 | Fill #1

## 2017-11-13 ENCOUNTER — Other Ambulatory Visit: Payer: Self-pay | Admitting: Internal Medicine

## 2017-11-13 ENCOUNTER — Ambulatory Visit: Payer: Self-pay | Admitting: Internal Medicine

## 2017-11-13 MED FILL — GABAPENTIN 800 MG TABLET: 800 | 30 days supply | Qty: 120 | Fill #1

## 2017-11-13 MED FILL — METOPROLOL TARTRATE 50 MG T: 50 | 30 days supply | Qty: 90 | Fill #1

## 2017-11-13 MED FILL — traZODone HCL 100 MG TABS: 100 | 30 days supply | Qty: 30 | Fill #1

## 2017-11-19 MED FILL — traMADol HCL 50 MG TABS: 50 | 30 days supply | Qty: 240 | Fill #2

## 2017-12-15 ENCOUNTER — Ambulatory Visit: Payer: Self-pay | Attending: Nurse Practitioner | Admitting: Nurse Practitioner

## 2017-12-15 ENCOUNTER — Other Ambulatory Visit: Payer: Self-pay

## 2017-12-15 ENCOUNTER — Encounter: Payer: Self-pay | Admitting: Nurse Practitioner

## 2017-12-15 VITALS — BP 162/105 | HR 70 | Temp 98.3°F | Ht 76.0 in | Wt 218.0 lb

## 2017-12-15 DIAGNOSIS — F10229 Alcohol dependence with intoxication, unspecified: Secondary | ICD-10-CM | POA: Insufficient documentation

## 2017-12-15 DIAGNOSIS — Z9114 Patient's other noncompliance with medication regimen: Secondary | ICD-10-CM

## 2017-12-15 DIAGNOSIS — E785 Hyperlipidemia, unspecified: Secondary | ICD-10-CM | POA: Insufficient documentation

## 2017-12-15 DIAGNOSIS — M542 Cervicalgia: Secondary | ICD-10-CM | POA: Insufficient documentation

## 2017-12-15 DIAGNOSIS — G629 Polyneuropathy, unspecified: Secondary | ICD-10-CM

## 2017-12-15 DIAGNOSIS — N400 Enlarged prostate without lower urinary tract symptoms: Secondary | ICD-10-CM | POA: Insufficient documentation

## 2017-12-15 DIAGNOSIS — Z91148 Patient's other noncompliance with medication regimen for other reason: Secondary | ICD-10-CM

## 2017-12-15 DIAGNOSIS — M25562 Pain in left knee: Secondary | ICD-10-CM | POA: Insufficient documentation

## 2017-12-15 DIAGNOSIS — Z79899 Other long term (current) drug therapy: Secondary | ICD-10-CM | POA: Insufficient documentation

## 2017-12-15 DIAGNOSIS — G894 Chronic pain syndrome: Secondary | ICD-10-CM

## 2017-12-15 DIAGNOSIS — M533 Sacrococcygeal disorders, not elsewhere classified: Secondary | ICD-10-CM | POA: Insufficient documentation

## 2017-12-15 DIAGNOSIS — F331 Major depressive disorder, recurrent, moderate: Secondary | ICD-10-CM | POA: Insufficient documentation

## 2017-12-15 DIAGNOSIS — K219 Gastro-esophageal reflux disease without esophagitis: Secondary | ICD-10-CM | POA: Insufficient documentation

## 2017-12-15 DIAGNOSIS — I10 Essential (primary) hypertension: Secondary | ICD-10-CM | POA: Insufficient documentation

## 2017-12-15 DIAGNOSIS — N529 Male erectile dysfunction, unspecified: Secondary | ICD-10-CM | POA: Insufficient documentation

## 2017-12-15 DIAGNOSIS — E559 Vitamin D deficiency, unspecified: Secondary | ICD-10-CM | POA: Insufficient documentation

## 2017-12-15 DIAGNOSIS — F411 Generalized anxiety disorder: Secondary | ICD-10-CM | POA: Insufficient documentation

## 2017-12-15 DIAGNOSIS — E876 Hypokalemia: Secondary | ICD-10-CM | POA: Insufficient documentation

## 2017-12-15 DIAGNOSIS — J449 Chronic obstructive pulmonary disease, unspecified: Secondary | ICD-10-CM | POA: Insufficient documentation

## 2017-12-15 DIAGNOSIS — K580 Irritable bowel syndrome with diarrhea: Secondary | ICD-10-CM | POA: Insufficient documentation

## 2017-12-15 DIAGNOSIS — Z79891 Long term (current) use of opiate analgesic: Secondary | ICD-10-CM | POA: Insufficient documentation

## 2017-12-15 DIAGNOSIS — G5793 Unspecified mononeuropathy of bilateral lower limbs: Secondary | ICD-10-CM

## 2017-12-15 DIAGNOSIS — J320 Chronic maxillary sinusitis: Secondary | ICD-10-CM | POA: Insufficient documentation

## 2017-12-15 DIAGNOSIS — I472 Ventricular tachycardia: Secondary | ICD-10-CM | POA: Insufficient documentation

## 2017-12-15 DIAGNOSIS — M25511 Pain in right shoulder: Secondary | ICD-10-CM | POA: Insufficient documentation

## 2017-12-15 DIAGNOSIS — Z8249 Family history of ischemic heart disease and other diseases of the circulatory system: Secondary | ICD-10-CM | POA: Insufficient documentation

## 2017-12-15 DIAGNOSIS — M25561 Pain in right knee: Secondary | ICD-10-CM | POA: Insufficient documentation

## 2017-12-15 DIAGNOSIS — Z888 Allergy status to other drugs, medicaments and biological substances status: Secondary | ICD-10-CM | POA: Insufficient documentation

## 2017-12-15 DIAGNOSIS — F17210 Nicotine dependence, cigarettes, uncomplicated: Secondary | ICD-10-CM | POA: Insufficient documentation

## 2017-12-15 DIAGNOSIS — M7918 Myalgia, other site: Secondary | ICD-10-CM

## 2017-12-15 DIAGNOSIS — G8929 Other chronic pain: Secondary | ICD-10-CM

## 2017-12-15 DIAGNOSIS — I426 Alcoholic cardiomyopathy: Secondary | ICD-10-CM | POA: Insufficient documentation

## 2017-12-15 HISTORY — DX: Patient's other noncompliance with medication regimen: Z91.14

## 2017-12-15 HISTORY — DX: Patient's other noncompliance with medication regimen for other reason: Z91.148

## 2017-12-15 NOTE — Progress Notes (Signed)
Patient's Name: Mark [name]  MRN: [id number]  Referring Provider: Ladell Pier, MD  DOB: 1966-01-14  PCP: Ladell Pier, MD  DOS: 12/15/2017  Note by: Vevelyn Francois NP  Service setting: Ambulatory outpatient  Specialty: Interventional Pain Management  Location: [facility] (AMB) Pain Management Facility    Patient type: Established    Primary Reason(s) for Visit: Encounter for prescription drug management. (Level of risk: moderate)  CC: Foot Pain  HPI  Mark [name] is a 52 y.o. year old, male patient, who comes today for a medication management evaluation. 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 ([place]); Alcohol intoxication ([place]); Chronic low back pain (Secondary source of pain) (Bilateral) (R>L); Chronic pain syndrome; Diarrhea; Paroxysmal VT ([place]); Opioid dependence ([place]); Long Q-T syndrome; Hypokalemia; Alcohol use disorder, severe, dependence ([place]); Chronic shoulder pain (Right); Chronic radicular low back pain [facility] source of pain) (Right) (to calf); Chronic fatigue; Vitamin D insufficiency; Poor dentition; Chronic maxillary sinusitis; Erectile dysfunction; Chronic anxiety; GERD (gastroesophageal reflux disease); GAD (generalized anxiety disorder); Alcohol use disorder, severe, in early remission ([place]); Long term current use of opiate analgesic; Long term prescription opiate use; Chronic neck pain (Bilateral)(R>L); Cervical fusion syndrome; Failed back surgical syndrome; Chronic knee pain  (Bilateral) (R>L); Major depressive disorder, recurrent episode, moderate ([place]); Musculoskeletal pain; 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 His primarily concern today is the Foot Pain  Pain Assessment: Location: Right, Left Foot Radiating: Denies Onset: More than a month ago Duration: Chronic pain Quality: Tingling, Tightness, Aching Severity: 7 /10 (subjective, self-reported pain score)  Note: Reported level is compatible with observation. Clinically the patient looks like a 3/10 A 3/10 is viewed as "Moderate" and described as significantly interfering with activities of daily living (ADL). It becomes difficult to feed, bathe, get dressed, get on and off the toilet or to perform personal hygiene functions. Difficult to get in and out of bed or a chair without assistance. Very distracting. With effort, it can be ignored when deeply involved in activities. Information on the proper use of the pain scale provided to the patient today. When using our objective Pain Scale, levels between 6 and 10/10 are said to belong in an emergency room, as it progressively worsens from a 6/10, described as severely limiting, requiring emergency care not usually available at an outpatient pain management facility. At a 6/10 level, communication becomes difficult and requires great effort. Assistance to reach the emergency department may be required. Facial flushing and profuse sweating along with potentially dangerous increases in heart rate and blood pressure will be evident. Effect on ADL: no prolonged standing or walking Timing: Constant Modifying factors: medications take off the edge BP: (!) 162/105  HR: 70  Mark [name] was last scheduled for an appointment on 09/18/2017 for medication management. During today's appointment we reviewed Mark [name] chronic pain status, as well as his outpatient medication regimen.  He admits that his pain is stable.  He  continues to have the neuropathy in his feet.Marland Kitchen  He is concerned however about gabapentin.  He states that he is on a high dose and was told by a relative that this may cause memory  problems.  He feels like he is having some short-term memory problems.  He would like to decrease his use of gabapentin and then discontinue it.  He admits that he does not feel like it is effective for his pain.  He states that is probably helping some but not enough to have him continue to use it because of the memory.  The patient  reports that he does not use drugs. His body mass index is 26.54 kg/m.  Further details on both, my assessment(s), as well as the proposed treatment plan, please see below.  Controlled Substance Pharmacotherapy Assessment REMS (Risk Evaluation and Mitigation Strategy)  Analgesic:Tramadol 100 mg 4 times a day (400 mg/dayof tramadol)  MME/day:43m/day.  BChauncey Fischer RN  12/15/2017  1:09 PM  Sign at close encounter Nursing Pain Medication Assessment:  Safety precautions to be maintained throughout the outpatient stay will include: orient to surroundings, keep bed in low position, maintain call bell within reach at all times, provide assistance with transfer out of bed and ambulation.  Medication Inspection Compliance: Pill count conducted under aseptic conditions, in front of the patient. Neither the pills nor the bottle was removed from the patient's sight at any time. Once count was completed pills were immediately returned to the patient in their original bottle.  Medication: Tramadol (Ultram) Pill/Patch Count: 7 of 240 pills remain Pill/Patch Appearance: Markings consistent with prescribed medication Bottle Appearance: Standard pharmacy container. Clearly labeled. Filled Date: 968/ 11 / 2019 Last Medication intake:  Today   Pharmacokinetics: Liberation and absorption (onset of action): WNL Distribution (time to peak effect): WNL Metabolism and excretion (duration of  action): WNL         Pharmacodynamics: Desired effects: Analgesia: Mark [name] >50% benefit. Functional ability: Patient reports that medication allows him to accomplish basic ADLs Clinically meaningful improvement in function (CMIF): Sustained CMIF goals met Perceived effectiveness: Described as relatively effective, allowing for increase in activities of daily living (ADL) Undesirable effects: Side-effects or Adverse reactions: None reported Monitoring: [place] PMP: Online review of the past 147-montheriod conducted. Compliant with practice rules and regulations Last UDS on record: Summary  Date Value Ref Range Status  06/19/2017 FINAL  Final    Comment:    ==================================================================== TOXASSURE SELECT 13 (MW) ==================================================================== Test                             Result       Flag       Units Drug Present and Declared for Prescription Verification   Tramadol                       283          EXPECTED   ng/mg creat   O-Desmethyltramadol            140          EXPECTED   ng/mg creat   N-Desmethyltramadol            167          EXPECTED   ng/mg creat    Source of tramadol is a prescription medication.    O-desmethyltramadol and N-desmethyltramadol are expected    metabolites of tramadol. Drug Present not Declared for Prescription Verification   Alprazolam                     101  UNEXPECTED ng/mg creat   Alpha-hydroxyalprazolam        164          UNEXPECTED ng/mg creat    Source of alprazolam is a scheduled prescription medication.    Alpha-hydroxyalprazolam is an expected metabolite of alprazolam. ==================================================================== Test                      Result    Flag   Units      Ref Range   Creatinine              371              mg/dL      >=20 ==================================================================== Declared Medications:   The flagging and interpretation on this report are based on the  following declared medications.  Unexpected results may arise from  inaccuracies in the declared medications.  **Note: The testing scope of this panel includes these medications:  Tramadol  **Note: The testing scope of this panel does not include following  reported medications:  Albuterol  Atorvastatin  Cyclobenzaprine  Gabapentin  Losartan (Losartan Potassium)  Metoprolol  Tadalafil  Tamsulosin  Trazodone ==================================================================== For clinical consultation, please call [phone]. ====================================================================    UDS interpretation: Compliant Patient informed of the CDC guidelines and recommendations to stay away from the concomitant use of benzodiazepines and opioids due to the increased risk of respiratory depression and death. Medication Assessment Form: Reviewed. Patient indicates being compliant with therapy Treatment compliance: Non-compliant Risk Assessment Profile: Aberrant behavior: early requests for medication refills, obtaining controlled substances from form other providers under false pretense that he would not be returning to the clinic, prescription  drug abuse and taking more medication than prescribed Comorbid factors increasing risk of overdose: See prior notes. No additional risks detected today Opioid risk tool (ORT) (Total Score): 6 Personal History of Substance Abuse (SUD-Substance use disorder):  Alcohol: Positive Male or Male  Illegal Drugs: Negative  Rx Drugs: Negative  ORT Risk Level calculation: Moderate Risk Risk of substance use disorder (SUD): Moderate-to-High Opioid Risk Tool - 12/15/17 1307      Family History of Substance Abuse   Alcohol  Positive Male    Illegal Drugs  Negative    Rx Drugs  Negative      Personal History of Substance Abuse   Alcohol  Positive Male or Male    Illegal  Drugs  Negative    Rx Drugs  Negative      Age   Age between 21-45 years   No      History of Preadolescent Sexual Abuse   History of Preadolescent Sexual Abuse  Negative or Male      Psychological Disease   Psychological Disease  Negative    Depression  Negative      Total Score   Opioid Risk Tool Scoring  6    Opioid Risk Interpretation  Moderate Risk      ORT Scoring interpretation table:  Score <3 = Low Risk for SUD  Score between 4-7 = Moderate Risk for SUD  Score >8 = High Risk for Opioid Abuse   Risk Mitigation Strategies:  Patient Counseling: Covered Patient-Prescriber Agreement (PPA): Medication agreement broken by patient  Notification to other healthcare providers: Done  Pharmacologic Plan: Opioid therapy discontinued.             Laboratory Chemistry  Inflammation Markers (CRP: Acute Phase) (ESR: Chronic Phase) Lab Results  Component Value Date   CRP  0.9 03/18/2016   ESRSEDRATE 2 03/18/2016   LATICACIDVEN 1.16 02/06/2015                         Rheumatology Markers Lab Results  Component Value Date   LABURIC 5.7 12/09/2011                        Renal Function Markers Lab Results  Component Value Date   BUN 7 09/08/2017   CREATININE 0.85 09/08/2017   BCR 12 04/09/2017   GFRAA >60 09/08/2017   GFRNONAA >60 09/08/2017                             Hepatic Function Markers Lab Results  Component Value Date   AST 24 09/08/2017   ALT 20 09/08/2017   ALBUMIN 4.2 09/08/2017   ALKPHOS 96 09/08/2017   HCVAB NEGATIVE 06/14/2012   AMYLASE 26 12/28/2012   LIPASE 27 09/08/2017   AMMONIA 27 06/27/2010                        Electrolytes Lab Results  Component Value Date   NA 138 09/08/2017   K 4.5 09/08/2017   CL 102 09/08/2017   CALCIUM 9.6 09/08/2017   MG 2.1 03/18/2016   PHOS 4.1 06/25/2012                        Neuropathy Markers Lab Results  Component Value Date   VITAMINB12 244 03/18/2016   FOLATE 3.0 (L) 06/07/2013   HGBA1C 6.0  04/03/2015   HIV NON REACTIVE 06/12/2012                        CNS Tests No results found for: COLORCSF, APPEARCSF, RBCCOUNTCSF, WBCCSF, POLYSCSF, LYMPHSCSF, EOSCSF, PROTEINCSF, GLUCCSF, JCVIRUS, CSFOLI, IGGCSF                      Bone Pathology Markers Lab Results  Component Value Date   VD25OH 21 (L) 07/04/2014   25OHVITD1 35 03/18/2016   25OHVITD2 1.0 03/18/2016   25OHVITD3 34 03/18/2016   TESTOFREE 78.9 09/16/2014   TESTOSTERONE 365 09/16/2014                         Coagulation Parameters Lab Results  Component Value Date   INR 1.04 06/07/2013   LABPROT 13.4 06/07/2013   PLT 286 09/08/2017   DDIMER <0.27 11/13/2015                        Cardiovascular Markers Lab Results  Component Value Date   BNP 23.9 02/27/2015   CKTOTAL 201 01/22/2012   CKMB 2.5 01/22/2012   TROPONINI <0.03 11/07/2015   HGB 17.2 (H) 09/08/2017   HCT 52.0 09/08/2017                         CA Markers No results found for: CEA, CA125, LABCA2                      Note: Lab results reviewed.  Recent Diagnostic Imaging Results  CT ABDOMEN PELVIS W CONTRAST CLINICAL DATA:  52 year old male with diffuse abdominal pain, progressed for 4 days. Dark watery stools yesterday.  EXAM: CT ABDOMEN AND PELVIS WITH CONTRAST  TECHNIQUE: Multidetector CT imaging of the abdomen and pelvis was performed using the standard protocol following bolus administration of intravenous contrast.  CONTRAST:  14m OMNIPAQUE IOHEXOL 300 MG/ML  SOLN  COMPARISON:  Lumbar MRI 05/22/2017. CT Abdomen and Pelvis 04/12/2014.  FINDINGS: Lower chest: Centrilobular emphysema in the lower lobes. Chronic posterior basal segment lower lobe scarring and mild architectural distortion is stable. No pericardial or pleural effusion.  Hepatobiliary: Mild hepatic steatosis. Otherwise negative liver and gallbladder.  Pancreas: Negative.  Spleen: Negative.  Adrenals/Urinary Tract: Normal adrenal glands. Symmetric and  normal bilateral renal enhancement and contrast excretion. Both ureters are normal.  Diminutive and unremarkable urinary bladder.  Stomach/Bowel:  Negative rectum. The sigmoid colon appears stable since 2016 with minimal diverticula, no active inflammation.  Negative left colon and transverse colon. The right colon is decompressed and within normal limits. The appendix is normal (coronal image 28). The terminal ileum is decompressed and negative.  No dilated or abnormal small bowel. Chronic postoperative changes about the gastric fundus. Otherwise negative stomach and duodenum.  No mesenteric inflammation identified. No abdominal free air, free fluid.  Vascular/Lymphatic: Aortoiliac calcified atherosclerosis. The major arterial structures in the abdomen and pelvis are patent. Grossly patent portal venous system.  No lymphadenopathy.  Reproductive: Negative.  Other: No pelvic free fluid.  Musculoskeletal: Chronic lower lumbar spine interbody fusion implants. Chronic bilateral femoral head AVN. No acute osseous abnormality identified.  IMPRESSION: 1. No acute or inflammatory process identified in the abdomen or pelvis. 2. Aortic Atherosclerosis (ICD10-I70.0). Pulmonary lower lobe Emphysema (ICD10-J43.9). 3. Mild chronic hepatic steatosis.  Chronic femoral head AVN.  Electronically Signed   By: HGenevie AnnM.D.   On: 09/08/2017 13:10  Complexity Note: Imaging results reviewed. Results shared with Mark [name] using Layman's terms.                         Meds   Current Outpatient Medications:  .  albuterol (VENTOLIN HFA) 108 (90 Base) MCG/ACT inhaler, Inhale 2 puffs into the lungs every 6 (six) hours as needed for wheezing or shortness of breath., Disp: 18 g, Rfl: 3 .  CIALIS 5 MG tablet, TAKE 1 TABLET BY MOUTH DAILY., Disp: 30 tablet, Rfl: 0 .  cyclobenzaprine (FLEXERIL) 10 MG tablet, Take 1 tablet (10 mg total) by mouth at bedtime., Disp: 30 tablet, Rfl: 2 .   gabapentin (NEURONTIN) 800 MG tablet, Take 1 tablet (800 mg total) by mouth 4 (four) times daily., Disp: 120 tablet, Rfl: 2 .  metoprolol (LOPRESSOR) 50 MG tablet, Take 1 tablet (50 mg total) by mouth 2 (two) times daily., Disp: 60 tablet, Rfl: 11 .  traMADol (ULTRAM) 50 MG tablet, Take 2 tablets (100 mg total) by mouth 4 (four) times daily. 1-2 tablets, Disp: 240 tablet, Rfl: 2 .  traZODone (DESYREL) 100 MG tablet, Take 1 tablet (100 mg total) by mouth at bedtime as needed., Disp: 30 tablet, Rfl: 3  ROS  Constitutional: Denies any fever or chills Gastrointestinal: No reported hemesis, hematochezia, vomiting, or acute GI distress Musculoskeletal: Denies any acute onset joint swelling, redness, loss of ROM, or weakness Neurological: No reported episodes of acute onset apraxia, aphasia, dysarthria, agnosia, amnesia, paralysis, loss of coordination, or loss of consciousness  Allergies  Mr. MBudais allergic to cozaar [losartan] and lisinopril.  [place] Drug: Mr. MTardif reports that he does not use drugs. Alcohol:  reports that he does not drink  alcohol. Tobacco:  reports that he has been smoking cigarettes. He has a 15.00 pack-year smoking history. He has never used smokeless tobacco. Medical:  has a past medical history of Alcohol withdrawal ([place]), Allergy, Anxiety (Dx 2014), COPD (chronic obstructive pulmonary disease) ([place]), Drug abuse ([place]), Enlarged prostate, ETOH abuse, GERD (gastroesophageal reflux disease) (Dx 2003), Headache(784.0), Hepatitis, Hyperlipidemia (Dx 2000), Hypertension (Dx 2011), IBS (irritable bowel syndrome), Irregular heart beat, Long Q-T syndrome (01/23/2014), Mental disorder, Neuromuscular disorder ([place]), Neuropathy (06/07/2013), Seizure due to alcohol withdrawal ([place]) (01/23/2014), Shortness of breath, and Withdrawal seizures ([place]) (2014). Surgical: Mark [name]  has a past surgical history that includes Back surgery (1995, 1999); Neck surgery (2000); Nasal sinus  surgery; left heart catheterization with coronary angiogram (N/A, 01/13/2014); Subacromial decompression (Right, 11/16/2014); Nissen fundoplication; 24 hour ph study (N/A, 01/16/2015); Upper gastrointestinal endoscopy; and Colonoscopy (1998). Family: family history includes Alcohol abuse in his father and paternal grandfather; Breast cancer in his paternal grandmother; Colon polyps in his mother; Hypertension in his mother; Lung cancer in his father.  Constitutional Exam  General appearance: Well nourished, well developed, and well hydrated. In no apparent acute distress Vitals:   12/15/17 1257  BP: (!) 162/105  Pulse: 70  Temp: 98.3 F (36.8 C)  SpO2: 99%  Weight: 218 lb (98.9 kg)  Height: _0  (1.93 m)   BMI Assessment: Estimated body mass index is 26.54 kg/m as calculated from the following:   Height as of this encounter: _1  (1.93 m).   Weight as of this encounter: 218 lb (98.9 kg). Psych/Mental status: Alert, oriented x 3 (person, place, & time)       Eyes: PERLA Respiratory: No evidence of acute respiratory distress  Assessment  Primary Diagnosis & Pertinent Problem List: The primary encounter diagnosis was Chronic sacroiliac joint pain (Bilateral) (R>L). Diagnoses of Neuropathy, Peripheral (Feet) ([name]) (Location of Primary Source of Pain) (Bilateral) (R>L), Lower extremity neuropathy (Primary Source of Pain) (Bilateral) (R>L), Musculoskeletal pain, and Chronic pain syndrome were also pertinent to this visit.  Status Diagnosis  Persistent Persistent Persistent 1. Chronic sacroiliac joint pain (Bilateral) (R>L)   2. Neuropathy, Peripheral (Feet) ([name]) (Location of Primary Source of Pain) (Bilateral) (R>L)   3. Lower extremity neuropathy (Primary Source of Pain) (Bilateral) (R>L)   4. Musculoskeletal pain   5. Chronic pain syndrome     Problems updated and reviewed during this visit: No problems updated. Plan of Care  Pharmacotherapy (Medications Ordered): No orders of the  defined types were placed in this encounter.  New Prescriptions   No medications on file   Medications administered today: [name] had no medications administered during this visit. Lab-work, procedure(s), and/or referral(s): No orders of the defined types were placed in this encounter.  Imaging and/or referral(s): None  Interventional therapies: Planned, scheduled, and/or pending:  Not at this time   Considering:  Lower extremity EMG/PNCV. (Radiculopathy versus peripheral neuropathy) Upper extremity EMG/PNCV.  Possibleright-sided L3 + L4 Lumbarsympathetic RFA Possible Racz procedure Diagnostic cervical epidural steroidinjection  Diagnostic bilateral cervical facet block  Possible bilateral cervical facet RFA Diagnostic bilateral lumbar facet block Possible bilateral lumbar facet RFA Diagnostic right intra-articular shoulder joint injection Diagnostic right suprascapular nerveblock Possibleright suprascapular RFA Diagnostic bilateral intra-articular knee joint injection Possible bilateral series of 5 Hyalgan knee injections Diagnostic bilateral Genicular nerve block Possible bilateral Genicular RFA Diagnostic bilateral greater occipital nerve block Possible bilateral greater occipital nerve RFA Diagnostic bilateral C2 +TON nerve block Possible bilateral C2 + TON RFA  Palliative PRN treatment(s):  Diagnostic Caudal ESI#3 Diagnostic right-sided L3 + L4 lumbar sympathetic block#3     Provider-requested follow-up: Return in about 3 months (around 03/17/2018) for MedMgmt.  Future Appointments  Date Time Provider [place]  12/23/2017  2:30 PM Ladell Pier, MD [facility] None   Primary Care Physician: Ladell Pier, MD Location: [facility] Outpatient Pain Management Facility Note by: Vevelyn Francois NP Date: 12/15/2017; Time: 1:28 PM  Pain Score Disclaimer: We use the NRS-11 scale. This is a self-reported, subjective  measurement of pain severity with only modest accuracy. It is used primarily to identify changes within a particular patient. It must be understood that outpatient pain scales are significantly less accurate that those used for research, where they can be applied under ideal controlled circumstances with minimal exposure to variables. In reality, the score is likely to be a combination of pain intensity and pain affect, where pain affect describes the degree of emotional arousal or changes in action readiness caused by the sensory experience of pain. Factors such as social and work situation, setting, emotional state, anxiety levels, expectation, and prior pain experience may influence pain perception and show large inter-individual differences that may also be affected by time variables.  Patient instructions provided during this appointment: Patient Instructions   ____________________________________________________________________________________________  Medication Rules  Applies to: All patients receiving prescriptions (written or electronic).  Pharmacy of record: Pharmacy where electronic prescriptions will be sent. If written prescriptions are taken to a different pharmacy, please inform the nursing staff. The pharmacy listed in the electronic medical record should be the one where you would like electronic prescriptions to be sent.  Prescription refills: Only during scheduled appointments. Applies to both, written and electronic prescriptions.  NOTE: The following applies primarily to controlled substances (Opioid* Pain Medications).   Patient's responsibilities: 1. Pain Pills: Bring all pain pills to every appointment (except for procedure appointments). 2. Pill Bottles: Bring pills in original pharmacy bottle. Always bring newest bottle. Bring bottle, even if empty. 3. Medication refills: You are responsible for knowing and keeping track of what medications you need refilled. The day  before your appointment, write a list of all prescriptions that need to be refilled. Bring that list to your appointment and give it to the admitting nurse. Prescriptions will be written only during appointments. If you forget a medication, it will not be "Called in", "Faxed", or "electronically sent". You will need to get another appointment to get these prescribed. 4. Prescription Accuracy: You are responsible for carefully inspecting your prescriptions before leaving our office. Have the discharge nurse carefully go over each prescription with you, before taking them home. Make sure that your name is accurately spelled, that your address is correct. Check the name and dose of your medication to make sure it is accurate. Check the number of pills, and the written instructions to make sure they are clear and accurate. Make sure that you are given enough medication to last until your next medication refill appointment. 5. Taking Medication: Take medication as prescribed. Never take more pills than instructed. Never take medication more frequently than prescribed. Taking less pills or less frequently is permitted and encouraged, when it comes to controlled substances (written prescriptions).  6. Inform other Doctors: Always inform, all of your healthcare providers, of all the medications you take. 7. Pain Medication from other Providers: You are not allowed to accept any additional pain medication from any other Doctor or Healthcare provider. There are two exceptions to this  rule. (see below) In the event that you require additional pain medication, you are responsible for notifying us, as stated below. 8. Medication Agreement: You are responsible for carefully reading and following our Medication Agreement. This must be signed before receiving any prescriptions from our practice. Safely store a copy of your signed Agreement. Violations to the Agreement will result in no further prescriptions. (Additional copies  of our Medication Agreement are available upon request.) 9. Laws, Rules, & Regulations: All patients are expected to follow all Federal and [employer], [employer], Rules, [employer]. Ignorance of the Laws does not constitute a valid excuse. The use of any illegal substances is prohibited. 10. Adopted CDC guidelines & recommendations: Target dosing levels will be at or below 60 MME/day. Use of benzodiazepines** is not recommended.  Exceptions: There are only two exceptions to the rule of not receiving pain medications from other Healthcare Providers. 1. Exception #1 (Emergencies): In the event of an emergency (i.e.: accident requiring emergency care), you are allowed to receive additional pain medication. However, you are responsible for: As soon as you are able, call our office [phone], at any time of the day or night, and leave a message stating your name, the date and nature of the emergency, and the name and dose of the medication prescribed. In the event that your call is answered by a member of our staff, make sure to document and save the date, time, and the name of the person that took your information.  2. Exception #2 (Planned Surgery): In the event that you are scheduled by another doctor or dentist to have any type of surgery or procedure, you are allowed (for a period no longer than 30 days), to receive additional pain medication, for the acute post-op pain. However, in this case, you are responsible for picking up a copy of our "Post-op Pain Management for Surgeons" handout, and giving it to your surgeon or dentist. This document is available at our office, and does not require an appointment to obtain it. Simply go to our office during business hours (Monday-Thursday from 8:00 AM to 4:00 PM) (Friday 8:00 AM to 12:00 Noon) or if you have a scheduled appointment with [place], prior to your surgery, and ask for it by name. In addition, you will need to provide [place] with your name, name of your  surgeon, type of surgery, and date of procedure or surgery.  *Opioid medications include: morphine, codeine, oxycodone, oxymorphone, hydrocodone, hydromorphone, meperidine, tramadol, tapentadol, buprenorphine, fentanyl, methadone. **Benzodiazepine medications include: diazepam (Valium), alprazolam (Xanax), clonazepam (Klonopine), lorazepam (Ativan), clorazepate (Tranxene), chlordiazepoxide (Librium), estazolam (Prosom), oxazepam (Serax), temazepam (Restoril), triazolam (Halcion) (Last updated: 05/08/2017) ____________________________________________________________________________________________   ____________________________________________________________________________________________  Pain Scale  Introduction: The pain score used by this practice is the Verbal Numerical Rating Scale (VNRS-11). This is an 11-point scale. It is for adults and children 10 years or older. There are significant differences in how the pain score is reported, used, and applied. Forget everything you learned in the past and learn this scoring system.  General Information: The scale should reflect your current level of pain. Unless you are specifically asked for the level of your worst pain, or your average pain. If you are asked for one of these two, then it should be understood that it is over the past 24 hours.  Basic Activities of Daily Living (ADL): Personal hygiene, dressing, eating, transferring, and using restroom.  Instructions: Most patients tend to report their level of pain as a combination of two  factors, their physical pain and their psychosocial pain. This last one is also known as "suffering" and it is reflection of how physical pain affects you socially and psychologically. From now on, report them separately. From this point on, when asked to report your pain level, report only your physical pain. Use the following table for reference.  Pain Clinic Pain Levels (0-5/10)  Pain Level Score   Description  No Pain 0   Mild pain 1 Nagging, annoying, but does not interfere with basic activities of daily living (ADL). Patients are able to eat, bathe, get dressed, toileting (being able to get on and off the toilet and perform personal hygiene functions), transfer (move in and out of bed or a chair without assistance), and maintain continence (able to control bladder and bowel functions). Blood pressure and heart rate are unaffected. A normal heart rate for a healthy adult ranges from 60 to 100 bpm (beats per minute).   Mild to moderate pain 2 Noticeable and distracting. Impossible to hide from other people. More frequent flare-ups. Still possible to adapt and function close to normal. It can be very annoying and may have occasional stronger flare-ups. With discipline, patients may get used to it and adapt.   Moderate pain 3 Interferes significantly with activities of daily living (ADL). It becomes difficult to feed, bathe, get dressed, get on and off the toilet or to perform personal hygiene functions. Difficult to get in and out of bed or a chair without assistance. Very distracting. With effort, it can be ignored when deeply involved in activities.   Moderately severe pain 4 Impossible to ignore for more than a few minutes. With effort, patients may still be able to manage work or participate in some social activities. Very difficult to concentrate. Signs of autonomic nervous system discharge are evident: dilated pupils (mydriasis); mild sweating (diaphoresis); sleep interference. Heart rate becomes elevated (>115 bpm). Diastolic blood pressure (lower number) rises above 100 mmHg. Patients find relief in laying down and not moving.   Severe pain 5 Intense and extremely unpleasant. Associated with frowning face and frequent crying. Pain overwhelms the senses.  Ability to do any activity or maintain social relationships becomes significantly limited. Conversation becomes difficult. Pacing back and  forth is common, as getting into a comfortable position is nearly impossible. Pain wakes you up from deep sleep. Physical signs will be obvious: pupillary dilation; increased sweating; goosebumps; brisk reflexes; cold, clammy hands and feet; nausea, vomiting or dry heaves; loss of appetite; significant sleep disturbance with inability to fall asleep or to remain asleep. When persistent, significant weight loss is observed due to the complete loss of appetite and sleep deprivation.  Blood pressure and heart rate becomes significantly elevated. Caution: If elevated blood pressure triggers a pounding headache associated with blurred vision, then the patient should immediately seek attention at an urgent or emergency care unit, as these may be signs of an impending stroke.    Emergency Department Pain Levels (6-10/10)  Emergency Room Pain 6 Severely limiting. Requires emergency care and should not be seen or managed at an outpatient pain management facility. Communication becomes difficult and requires great effort. Assistance to reach the emergency department may be required. Facial flushing and profuse sweating along with potentially dangerous increases in heart rate and blood pressure will be evident.   Distressing pain 7 Self-care is very difficult. Assistance is required to transport, or use restroom. Assistance to reach the emergency department will be required. Tasks requiring coordination, such as bathing  and getting dressed become very difficult.   Disabling pain 8 Self-care is no longer possible. At this level, pain is disabling. The individual is unable to do even the most "basic" activities such as walking, eating, bathing, dressing, transferring to a bed, or toileting. Fine motor skills are lost. It is difficult to think clearly.   Incapacitating pain 9 Pain becomes incapacitating. Thought processing is no longer possible. Difficult to remember your own name. Control of movement and coordination  are lost.   The worst pain imaginable 10 At this level, most patients pass out from pain. When this level is reached, collapse of the autonomic nervous system occurs, leading to a sudden drop in blood pressure and heart rate. This in turn results in a temporary and dramatic drop in blood flow to the brain, leading to a loss of consciousness. Fainting is one of the body's self defense mechanisms. Passing out puts the brain in a calmed state and causes it to shut down for a while, in order to begin the healing process.    Summary: 1. Refer to this scale when providing [place] with your pain level. 2. Be accurate and careful when reporting your pain level. This will help with your care. 3. Over-reporting your pain level will lead to loss of credibility. 4. Even a level of 1/10 means that there is pain and will be treated at our facility. 5. High, inaccurate reporting will be documented as "Symptom Exaggeration", leading to loss of credibility and suspicions of possible secondary gains such as obtaining more narcotics, or wanting to appear disabled, for fraudulent reasons. 6. Only pain levels of 5 or below will be seen at our facility. 7. Pain levels of 6 and above will be sent to the Emergency Department and the appointment cancelled. ____________________________________________________________________________________________

## 2017-12-15 NOTE — Patient Instructions (Addendum)
____________________________________________________________________________________________  Medication Rules  Applies to: All patients receiving prescriptions (written or electronic).  Pharmacy of record: Pharmacy where electronic prescriptions will be sent. If written prescriptions are taken to a different pharmacy, please inform the nursing staff. The pharmacy listed in the electronic medical record should be the one where you would like electronic prescriptions to be sent.  Prescription refills: Only during scheduled appointments. Applies to both, written and electronic prescriptions.  NOTE: The following applies primarily to controlled substances (Opioid* Pain Medications).   Patient's responsibilities: 1. Pain Pills: Bring all pain pills to every appointment (except for procedure appointments). 2. Pill Bottles: Bring pills in original pharmacy bottle. Always bring newest bottle. Bring bottle, even if empty. 3. Medication refills: You are responsible for knowing and keeping track of what medications you need refilled. The day before your appointment, write a list of all prescriptions that need to be refilled. Bring that list to your appointment and give it to the admitting nurse. Prescriptions will be written only during appointments. If you forget a medication, it will not be "Called in", "Faxed", or "electronically sent". You will need to get another appointment to get these prescribed. 4. Prescription Accuracy: You are responsible for carefully inspecting your prescriptions before leaving our office. Have the discharge nurse carefully go over each prescription with you, before taking them home. Make sure that your name is accurately spelled, that your address is correct. Check the name and dose of your medication to make sure it is accurate. Check the number of pills, and the written instructions to make sure they are clear and accurate. Make sure that you are given enough medication to last  until your next medication refill appointment. 5. Taking Medication: Take medication as prescribed. Never take more pills than instructed. Never take medication more frequently than prescribed. Taking less pills or less frequently is permitted and encouraged, when it comes to controlled substances (written prescriptions).  6. Inform other Doctors: Always inform, all of your healthcare providers, of all the medications you take. 7. Pain Medication from other Providers: You are not allowed to accept any additional pain medication from any other Doctor or Healthcare provider. There are two exceptions to this rule. (see below) In the event that you require additional pain medication, you are responsible for notifying us, as stated below. 8. Medication Agreement: You are responsible for carefully reading and following our Medication Agreement. This must be signed before receiving any prescriptions from our practice. Safely store a copy of your signed Agreement. Violations to the Agreement will result in no further prescriptions. (Additional copies of our Medication Agreement are available upon request.) 9. Laws, Rules, & Regulations: All patients are expected to follow all Federal and State Laws, Statutes, Rules, & Regulations. Ignorance of the Laws does not constitute a valid excuse. The use of any illegal substances is prohibited. 10. Adopted CDC guidelines & recommendations: Target dosing levels will be at or below 60 MME/day. Use of benzodiazepines** is not recommended.  Exceptions: There are only two exceptions to the rule of not receiving pain medications from other Healthcare Providers. 1. Exception #1 (Emergencies): In the event of an emergency (i.e.: accident requiring emergency care), you are allowed to receive additional pain medication. However, you are responsible for: As soon as you are able, call our office [phone], at any time of the day or night, and leave a message stating your name, the  date and nature of the emergency, and the name and dose of the medication   prescribed. In the event that your call is answered by a member of our staff, make sure to document and save the date, time, and the name of the person that took your information.  2. Exception #2 (Planned Surgery): In the event that you are scheduled by another doctor or dentist to have any type of surgery or procedure, you are allowed (for a period no longer than 30 days), to receive additional pain medication, for the acute post-op pain. However, in this case, you are responsible for picking up a copy of our "Post-op Pain Management for Surgeons" handout, and giving it to your surgeon or dentist. This document is available at our office, and does not require an appointment to obtain it. Simply go to our office during business hours (Monday-Thursday from 8:00 AM to 4:00 PM) (Friday 8:00 AM to 12:00 Noon) or if you have a scheduled appointment with us, prior to your surgery, and ask for it by name. In addition, you will need to provide us with your name, name of your surgeon, type of surgery, and date of procedure or surgery.  *Opioid medications include: morphine, codeine, oxycodone, oxymorphone, hydrocodone, hydromorphone, meperidine, tramadol, tapentadol, buprenorphine, fentanyl, methadone. **Benzodiazepine medications include: diazepam (Valium), alprazolam (Xanax), clonazepam (Klonopine), lorazepam (Ativan), clorazepate (Tranxene), chlordiazepoxide (Librium), estazolam (Prosom), oxazepam (Serax), temazepam (Restoril), triazolam (Halcion) (Last updated: 05/08/2017) ____________________________________________________________________________________________   ____________________________________________________________________________________________  Pain Scale  Introduction: The pain score used by this practice is the Verbal Numerical Rating Scale (VNRS-11). This is an 11-point scale. It is for adults and children 10 years or  older. There are significant differences in how the pain score is reported, used, and applied. Forget everything you learned in the past and learn this scoring system.  General Information: The scale should reflect your current level of pain. Unless you are specifically asked for the level of your worst pain, or your average pain. If you are asked for one of these two, then it should be understood that it is over the past 24 hours.  Basic Activities of Daily Living (ADL): Personal hygiene, dressing, eating, transferring, and using restroom.  Instructions: Most patients tend to report their level of pain as a combination of two factors, their physical pain and their psychosocial pain. This last one is also known as "suffering" and it is reflection of how physical pain affects you socially and psychologically. From now on, report them separately. From this point on, when asked to report your pain level, report only your physical pain. Use the following table for reference.  Pain Clinic Pain Levels (0-5/10)  Pain Level Score  Description  No Pain 0   Mild pain 1 Nagging, annoying, but does not interfere with basic activities of daily living (ADL). Patients are able to eat, bathe, get dressed, toileting (being able to get on and off the toilet and perform personal hygiene functions), transfer (move in and out of bed or a chair without assistance), and maintain continence (able to control bladder and bowel functions). Blood pressure and heart rate are unaffected. A normal heart rate for a healthy adult ranges from 60 to 100 bpm (beats per minute).   Mild to moderate pain 2 Noticeable and distracting. Impossible to hide from other people. More frequent flare-ups. Still possible to adapt and function close to normal. It can be very annoying and may have occasional stronger flare-ups. With discipline, patients may get used to it and adapt.   Moderate pain 3 Interferes significantly with activities of daily  living (ADL).   It becomes difficult to feed, bathe, get dressed, get on and off the toilet or to perform personal hygiene functions. Difficult to get in and out of bed or a chair without assistance. Very distracting. With effort, it can be ignored when deeply involved in activities.   Moderately severe pain 4 Impossible to ignore for more than a few minutes. With effort, patients may still be able to manage work or participate in some social activities. Very difficult to concentrate. Signs of autonomic nervous system discharge are evident: dilated pupils (mydriasis); mild sweating (diaphoresis); sleep interference. Heart rate becomes elevated (>115 bpm). Diastolic blood pressure (lower number) rises above 100 mmHg. Patients find relief in laying down and not moving.   Severe pain 5 Intense and extremely unpleasant. Associated with frowning face and frequent crying. Pain overwhelms the senses.  Ability to do any activity or maintain social relationships becomes significantly limited. Conversation becomes difficult. Pacing back and forth is common, as getting into a comfortable position is nearly impossible. Pain wakes you up from deep sleep. Physical signs will be obvious: pupillary dilation; increased sweating; goosebumps; brisk reflexes; cold, clammy hands and feet; nausea, vomiting or dry heaves; loss of appetite; significant sleep disturbance with inability to fall asleep or to remain asleep. When persistent, significant weight loss is observed due to the complete loss of appetite and sleep deprivation.  Blood pressure and heart rate becomes significantly elevated. Caution: If elevated blood pressure triggers a pounding headache associated with blurred vision, then the patient should immediately seek attention at an urgent or emergency care unit, as these may be signs of an impending stroke.    Emergency Department Pain Levels (6-10/10)  Emergency Room Pain 6 Severely limiting. Requires emergency care  and should not be seen or managed at an outpatient pain management facility. Communication becomes difficult and requires great effort. Assistance to reach the emergency department may be required. Facial flushing and profuse sweating along with potentially dangerous increases in heart rate and blood pressure will be evident.   Distressing pain 7 Self-care is very difficult. Assistance is required to transport, or use restroom. Assistance to reach the emergency department will be required. Tasks requiring coordination, such as bathing and getting dressed become very difficult.   Disabling pain 8 Self-care is no longer possible. At this level, pain is disabling. The individual is unable to do even the most "basic" activities such as walking, eating, bathing, dressing, transferring to a bed, or toileting. Fine motor skills are lost. It is difficult to think clearly.   Incapacitating pain 9 Pain becomes incapacitating. Thought processing is no longer possible. Difficult to remember your own name. Control of movement and coordination are lost.   The worst pain imaginable 10 At this level, most patients pass out from pain. When this level is reached, collapse of the autonomic nervous system occurs, leading to a sudden drop in blood pressure and heart rate. This in turn results in a temporary and dramatic drop in blood flow to the brain, leading to a loss of consciousness. Fainting is one of the body's self defense mechanisms. Passing out puts the brain in a calmed state and causes it to shut down for a while, in order to begin the healing process.    Summary: 1. Refer to this scale when providing us with your pain level. 2. Be accurate and careful when reporting your pain level. This will help with your care. 3. Over-reporting your pain level will lead to loss of credibility. 4. Even   a level of 1/10 means that there is pain and will be treated at our facility. 5. High, inaccurate reporting will be  documented as "Symptom Exaggeration", leading to loss of credibility and suspicions of possible secondary gains such as obtaining more narcotics, or wanting to appear disabled, for fraudulent reasons. 6. Only pain levels of 5 or below will be seen at our facility. 7. Pain levels of 6 and above will be sent to the Emergency Department and the appointment cancelled. ____________________________________________________________________________________________    

## 2017-12-15 NOTE — Progress Notes (Signed)
Nursing Pain Medication Assessment:  Safety precautions to be maintained throughout the outpatient stay will include: orient to surroundings, keep bed in low position, maintain call bell within reach at all times, provide assistance with transfer out of bed and ambulation.  Medication Inspection Compliance: Pill count conducted under aseptic conditions, in front of the patient. Neither the pills nor the bottle was removed from the patient's sight at any time. Once count was completed pills were immediately returned to the patient in their original bottle.  Medication: Tramadol (Ultram) Pill/Patch Count: 7 of 240 pills remain Pill/Patch Appearance: Markings consistent with prescribed medication Bottle Appearance: Standard pharmacy container. Clearly labeled. Filled Date: 79 / 11 / 2019 Last Medication intake:  Today

## 2017-12-17 ENCOUNTER — Emergency Department (HOSPITAL_COMMUNITY)
Admission: EM | Admit: 2017-12-17 | Discharge: 2017-12-18 | Disposition: A | Discharge status: Home / Self Care | Payer: Self-pay | Attending: Emergency Medicine | Admitting: Emergency Medicine

## 2017-12-17 ENCOUNTER — Emergency Department (HOSPITAL_COMMUNITY): Payer: Self-pay

## 2017-12-17 ENCOUNTER — Other Ambulatory Visit: Payer: Self-pay

## 2017-12-17 ENCOUNTER — Encounter (HOSPITAL_COMMUNITY): Payer: Self-pay | Admitting: Emergency Medicine

## 2017-12-17 DIAGNOSIS — R079 Chest pain, unspecified: Secondary | ICD-10-CM | POA: Insufficient documentation

## 2017-12-17 DIAGNOSIS — Z5321 Procedure and treatment not carried out due to patient leaving prior to being seen by health care provider: Secondary | ICD-10-CM | POA: Insufficient documentation

## 2017-12-17 LAB — CBC
HCT: 53.9 % — ABNORMAL HIGH (ref 39.0–52.0)
HEMOGLOBIN: 17.8 g/dL — AB (ref 13.0–17.0)
MCH: 27.9 pg (ref 26.0–34.0)
MCHC: 33 g/dL (ref 30.0–36.0)
MCV: 84.5 fL (ref 80.0–100.0)
NRBC: 0 % (ref 0.0–0.2)
Platelets: 339 10*3/uL (ref 150–400)
RBC: 6.38 MIL/uL — ABNORMAL HIGH (ref 4.22–5.81)
RDW: 12.4 % (ref 11.5–15.5)
WBC: 8.7 10*3/uL (ref 4.0–10.5)

## 2017-12-17 LAB — BASIC METABOLIC PANEL
Anion gap: 15 (ref 5–15)
CHLORIDE: 95 mmol/L — AB (ref 98–111)
CO2: 22 mmol/L (ref 22–32)
Calcium: 9.2 mg/dL (ref 8.9–10.3)
Creatinine, Ser: 0.72 mg/dL (ref 0.61–1.24)
GFR calc Af Amer: >60 mL/min (ref >60)
GFR calc non Af Amer: >60 mL/min (ref >60)
Glucose, Bld: 102 mg/dL — ABNORMAL HIGH (ref 70–99)
POTASSIUM: 3.5 mmol/L (ref 3.5–5.1)
SODIUM: 132 mmol/L — AB (ref 135–145)

## 2017-12-17 LAB — LIPASE, BLOOD: Lipase: 38 U/L (ref 11–51)

## 2017-12-17 LAB — I-STAT TROPONIN, ED: TROPONIN I, POC: 0 ng/mL (ref 0.00–0.08)

## 2017-12-17 NOTE — ED Triage Notes (Addendum)
Pt reports central chest pressure that radiates to both hands, sob and abd pain that started this afternoon. Reports nausea. Denies V/D. Pt reports this pain happens often. Hx of stomach surgeries, etoh abuse - last drink today - a couple of beers. Hx of PVCs/irregular heart rate and HTN and is compliant with medications.

## 2017-12-17 NOTE — ED Notes (Signed)
Pt called twice for recheck vitals twice, no answer

## 2017-12-18 NOTE — ED Notes (Signed)
Pt called,no answer.

## 2017-12-21 ENCOUNTER — Emergency Department (HOSPITAL_COMMUNITY): Payer: Self-pay

## 2017-12-21 ENCOUNTER — Other Ambulatory Visit: Payer: Self-pay

## 2017-12-21 ENCOUNTER — Emergency Department (HOSPITAL_COMMUNITY)
Admission: EM | Admit: 2017-12-21 | Discharge: 2017-12-21 | Disposition: A | Discharge status: Home / Self Care | Payer: Self-pay | Attending: Emergency Medicine | Admitting: Emergency Medicine

## 2017-12-21 ENCOUNTER — Encounter (HOSPITAL_COMMUNITY): Payer: Self-pay | Admitting: Emergency Medicine

## 2017-12-21 DIAGNOSIS — J40 Bronchitis, not specified as acute or chronic: Secondary | ICD-10-CM | POA: Insufficient documentation

## 2017-12-21 DIAGNOSIS — F419 Anxiety disorder, unspecified: Secondary | ICD-10-CM | POA: Insufficient documentation

## 2017-12-21 DIAGNOSIS — I10 Essential (primary) hypertension: Secondary | ICD-10-CM | POA: Insufficient documentation

## 2017-12-21 DIAGNOSIS — F1190 Opioid use, unspecified, uncomplicated: Secondary | ICD-10-CM | POA: Insufficient documentation

## 2017-12-21 DIAGNOSIS — Z79899 Other long term (current) drug therapy: Secondary | ICD-10-CM | POA: Insufficient documentation

## 2017-12-21 DIAGNOSIS — F329 Major depressive disorder, single episode, unspecified: Secondary | ICD-10-CM | POA: Insufficient documentation

## 2017-12-21 DIAGNOSIS — F17210 Nicotine dependence, cigarettes, uncomplicated: Secondary | ICD-10-CM | POA: Insufficient documentation

## 2017-12-21 DIAGNOSIS — J449 Chronic obstructive pulmonary disease, unspecified: Secondary | ICD-10-CM | POA: Insufficient documentation

## 2017-12-21 DIAGNOSIS — F1010 Alcohol abuse, uncomplicated: Secondary | ICD-10-CM | POA: Insufficient documentation

## 2017-12-21 LAB — CBC WITH DIFFERENTIAL/PLATELET
Abs Immature Granulocytes: 0.14 10*3/uL — ABNORMAL HIGH (ref 0.00–0.07)
BASOS ABS: 0.1 10*3/uL (ref 0.0–0.1)
Basophils Relative: 1 %
EOS PCT: 1 %
Eosinophils Absolute: 0.1 10*3/uL (ref 0.0–0.5)
HEMATOCRIT: 53.2 % — AB (ref 39.0–52.0)
HEMOGLOBIN: 18.8 g/dL — AB (ref 13.0–17.0)
Immature Granulocytes: 1 %
LYMPHS ABS: 2.4 10*3/uL (ref 0.7–4.0)
LYMPHS PCT: 22 %
MCH: 29.3 pg (ref 26.0–34.0)
MCHC: 35.3 g/dL (ref 30.0–36.0)
MCV: 82.9 fL (ref 80.0–100.0)
MONO ABS: 0.5 10*3/uL (ref 0.1–1.0)
Monocytes Relative: 4 %
NEUTROS PCT: 71 %
NRBC: 0 % (ref 0.0–0.2)
Neutro Abs: 7.9 10*3/uL — ABNORMAL HIGH (ref 1.7–7.7)
Platelets: 341 10*3/uL (ref 150–400)
RBC: 6.42 MIL/uL — ABNORMAL HIGH (ref 4.22–5.81)
RDW: 12.4 % (ref 11.5–15.5)
WBC: 11.1 10*3/uL — AB (ref 4.0–10.5)

## 2017-12-21 LAB — HEPATIC FUNCTION PANEL
ALT: 39 U/L (ref 0–44)
AST: 34 U/L (ref 15–41)
Albumin: 4.5 g/dL (ref 3.5–5.0)
Alkaline Phosphatase: 114 U/L (ref 38–126)
BILIRUBIN INDIRECT: 0.7 mg/dL (ref 0.3–0.9)
Total Bilirubin: 0.7 mg/dL (ref 0.3–1.2)
Total Protein: 7.8 g/dL (ref 6.5–8.1)

## 2017-12-21 LAB — CBC
HCT: 53.8 % — ABNORMAL HIGH (ref 39.0–52.0)
Hemoglobin: 19.1 g/dL — ABNORMAL HIGH (ref 13.0–17.0)
MCH: 29 pg (ref 26.0–34.0)
MCHC: 35.5 g/dL (ref 30.0–36.0)
MCV: 81.8 fL (ref 80.0–100.0)
PLATELETS: 335 10*3/uL (ref 150–400)
RBC: 6.58 MIL/uL — ABNORMAL HIGH (ref 4.22–5.81)
RDW: 12.5 % (ref 11.5–15.5)
WBC: 11 10*3/uL — AB (ref 4.0–10.5)
nRBC: 0 % (ref 0.0–0.2)

## 2017-12-21 LAB — BASIC METABOLIC PANEL
Anion gap: 17 — ABNORMAL HIGH (ref 5–15)
CO2: 23 mmol/L (ref 22–32)
CREATININE: 0.6 mg/dL — AB (ref 0.61–1.24)
Calcium: 10.1 mg/dL (ref 8.9–10.3)
Chloride: 90 mmol/L — ABNORMAL LOW (ref 98–111)
GFR calc Af Amer: >60 mL/min (ref >60)
GFR calc non Af Amer: >60 mL/min (ref >60)
Glucose, Bld: 117 mg/dL — ABNORMAL HIGH (ref 70–99)
Potassium: 3.5 mmol/L (ref 3.5–5.1)
SODIUM: 130 mmol/L — AB (ref 135–145)

## 2017-12-21 LAB — I-STAT TROPONIN, ED: TROPONIN I, POC: 0 ng/mL (ref 0.00–0.08)

## 2017-12-21 MED ORDER — IOPAMIDOL (ISOVUE-370) INJECTION 76%
INTRAVENOUS | Status: AC
  Filled 2017-12-21: qty 100

## 2017-12-21 MED ORDER — IPRATROPIUM-ALBUTEROL 0.5-2.5 (3) MG/3ML IN SOLN
3.0000 mL | Freq: Once | RESPIRATORY_TRACT | Status: AC
  Administered 2017-12-21: 3 mL via RESPIRATORY_TRACT
  Filled 2017-12-21: qty 3

## 2017-12-21 MED ORDER — SODIUM CHLORIDE 0.9 % IJ SOLN
INTRAMUSCULAR | Status: AC
  Filled 2017-12-21: qty 50

## 2017-12-21 MED ORDER — ALBUTEROL SULFATE (2.5 MG/3ML) 0.083% IN NEBU
2.5000 mg | INHALATION_SOLUTION | Freq: Once | RESPIRATORY_TRACT | Status: AC
  Administered 2017-12-21: 2.5 mg via RESPIRATORY_TRACT
  Filled 2017-12-21: qty 3

## 2017-12-21 MED ORDER — HYDROCODONE-ACETAMINOPHEN 5-325 MG PO TABS: 1.0000 | ORAL_TABLET | Freq: Four times a day (QID) | ORAL | 0 refills | Status: DC | PRN

## 2017-12-21 MED ORDER — IOPAMIDOL (ISOVUE-370) INJECTION 76%
100.0000 mL | Freq: Once | INTRAVENOUS | Status: AC | PRN
  Administered 2017-12-21: 100 mL via INTRAVENOUS

## 2017-12-21 MED ORDER — DOXYCYCLINE HYCLATE 100 MG PO CAPS: 100.0000 mg | ORAL_CAPSULE | Freq: Two times a day (BID) | ORAL | 0 refills | Status: DC

## 2017-12-21 NOTE — ED Triage Notes (Addendum)
Pt c/o headache, shortness of breath, sternal chest pain and anxiety x 1 week. Pt states he had gone to [facility] last week but did not wait to be seen and symptoms remain. Pt states he is feeling anxious. Pt also reports he stopped taking Tramadol 6 days ago that he had taken for several years and states he immediately started drinking alcohol, approximately 12 beers per day and sometimes up to 18.

## 2017-12-21 NOTE — ED Notes (Signed)
Pt voices tightness in chest after breathing treatment and a feeling of anxiousness.

## 2017-12-21 NOTE — ED Notes (Signed)
Pt ambulated to bathroom and back to room independently.

## 2017-12-21 NOTE — ED Notes (Signed)
Bed: WA17 Expected date:  Expected time:  Means of arrival:  Comments: Hold hematuria POV

## 2017-12-21 NOTE — ED Provider Notes (Signed)
[facility] DEPT Provider Note   CSN: [id number] Arrival date & time: 12/21/17  1603     History   Chief Complaint Chief Complaint  Patient presents with  . Shortness of Breath  . Chest Pain  . Weakness  . Anxiety  . Headache    HPI Mark [name] is a 52 y.o. male.  Patient complains of pain in the chest with some shortness of breath.  Worse with breathing and also patient has a cough  The history is provided by the patient.  Shortness of Breath  This is a chronic problem. The problem occurs continuously.The current episode started 12 to 24 hours ago. The problem has not changed since onset.Associated symptoms include headaches and chest pain. Pertinent negatives include no cough, no abdominal pain and no rash. It is unknown what precipitated the problem. He has tried nothing for the symptoms. The treatment provided no relief. He has had prior hospitalizations. He has had prior ED visits. Associated medical issues include COPD.  Chest Pain   Associated symptoms include headaches, shortness of breath and weakness. Pertinent negatives include no abdominal pain, no back pain and no cough.  Pertinent negatives for past medical history include no seizures.  Weakness  Associated symptoms include shortness of breath, chest pain and headaches.  Anxiety  Associated symptoms include chest pain, headaches and shortness of breath. Pertinent negatives include no abdominal pain.  Headache   Associated symptoms include shortness of breath.    Past Medical History:  Diagnosis Date  . Alcohol withdrawal ([place])   . Allergy   . Anxiety Dx 2014  . COPD (chronic obstructive pulmonary disease) ([place])   . Drug abuse ([place])    pt reports opioid dependence due to previous back surgeries  . Enlarged prostate   . ETOH abuse   . GERD (gastroesophageal reflux disease) Dx 2003  . Headache(784.0)   . Hepatitis   . Hyperlipidemia Dx 2000  . Hypertension Dx 2011  .  IBS (irritable bowel syndrome)    alternates constiaption/ diarrhea  . Irregular heart beat   . 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 Mental disorder   . Neuromuscular disorder ([place])    hands hurt   . Neuropathy 06/07/2013  . Seizure due to alcohol withdrawal ([place]) 01/23/2014   Per patient report  . Shortness of breath   . Withdrawal seizures ([place]) 2014   last seizure 2014 etoh with drawal    Patient Active Problem List   Diagnosis Date Noted  . Pain management contract broken 12/15/2017  . Dyspepsia   . Opioid-induced hyperalgesia (with oxycodone) 02/11/2017  . Allodynia (Lower extremities) 12/26/2016  . Hyperalgesia (Lower extremities) 12/26/2016  . Sympathetic pain (lower extremity) 09/26/2016  . Lower extremity neuropathy (Primary Source of Pain) (Bilateral) (R>L) 09/26/2016  . Lumbar facet syndrome (Bilateral) (R>L) 08/22/2016  . Chronic sacroiliac joint pain (Bilateral) (R>L) 08/22/2016  . Chronic lower extremity pain (Bilateral) (R>L) 06/26/2016  . Opiate use 05/14/2016  . Occipital headache (Bilateral) (R>L) 04/16/2016  . Musculoskeletal pain 04/15/2016  . 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 Major depressive disorder, recurrent episode, moderate ([place]) 04/10/2016  . Long term current use of opiate analgesic 03/18/2016  . Long term prescription opiate use 03/18/2016  . Chronic neck pain (Bilateral)(R>L) 03/18/2016  . Cervical fusion syndrome 03/18/2016  . 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 Chronic knee pain  (Bilateral) (R>L) 03/18/2016  . GAD (generalized anxiety disorder) 12/06/2015  . Alcohol use disorder, severe, in  early remission ([place]) 12/06/2015  . GERD (gastroesophageal reflux disease) 07/24/2015  . Chronic anxiety 12/19/2014  . Erectile dysfunction 09/16/2014  . Poor dentition 07/28/2014  . Chronic maxillary sinusitis 07/28/2014  . Vitamin D insufficiency 07/05/2014  . Chronic fatigue 07/04/2014  . Chronic radicular low back pain  [facility] source of pain) (Right) (to calf) 06/02/2014  . Chronic shoulder pain (Right) 05/12/2014  . Alcohol use disorder, severe, dependence ([place])   . 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 Hypokalemia   . Opioid dependence ([place]) 01/22/2014    Class: Acute  . Paroxysmal VT ([place]) 12/19/2013  . Chronic low back pain (Secondary source of pain) (Bilateral) (R>L) 12/28/2012  . Chronic pain syndrome 12/28/2012  . Diarrhea 12/28/2012  . Alcohol intoxication ([place]) 06/09/2012  . 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 Nonsustained ventricular tachycardia ([place]) 01/20/2012  . Tobacco abuse 01/19/2012  . Essential hypertension   . Hyperlipidemia     Past Surgical History:  Procedure Laterality Date  . [place] STUDY N/A 01/16/2015   Procedure: [place] STUDY;  Surgeon: Manus Gunning, MD;  Location: [facility];  Service: Gastroenterology;  Laterality: N/A;  . [place]  . COLONOSCOPY  1998  . LEFT HEART CATHETERIZATION WITH CORONARY ANGIOGRAM N/A 01/13/2014   Procedure: LEFT HEART CATHETERIZATION WITH CORONARY ANGIOGRAM;  Surgeon: Clent Demark, MD;  Location: [facility];  Service: Cardiovascular;  Laterality: N/A;  . NASAL SINUS SURGERY    . NECK SURGERY  2000   cervial fusion   . NISSEN FUNDOPLICATION    . SUBACROMIAL DECOMPRESSION Right 11/16/2014   Procedure: RIGHT SHOULDER ARTHROSCOPY WITH SUBACROMIAL DECOMPRESSION ;  Surgeon: Leandrew Koyanagi, MD;  Location: [facility];  Service: Orthopedics;  Laterality: Right;  . UPPER GASTROINTESTINAL ENDOSCOPY          Home Medications    Prior to Admission medications   Medication Sig Start Date End Date Taking? Authorizing Provider  cyclobenzaprine (FLEXERIL) 10 MG tablet Take 10 mg by mouth 3 (three) times daily as needed for muscle spasms.   Yes [provider]  gabapentin (NEURONTIN) 800 MG tablet Take 1 tablet (800 mg total) by mouth 4 (four) times daily. Patient  taking differently: Take 800 mg by mouth 3 (three) times daily.  09/18/17 12/21/17 Yes Vevelyn Francois, NP  metoprolol (LOPRESSOR) 50 MG tablet Take 1 tablet (50 mg total) by mouth 2 (two) times daily. Patient taking differently: Take 50 mg by mouth 3 (three) times daily.  07/24/15  Yes Funches, Adriana Mccallum, MD  traZODone (DESYREL) 100 MG tablet Take 1 tablet (100 mg total) by mouth at bedtime as needed. Patient taking differently: Take 50-100 mg by mouth at bedtime as needed for sleep.  08/21/17  Yes Ladell Pier, MD  albuterol (VENTOLIN HFA) 108 (90 Base) MCG/ACT inhaler Inhale 2 puffs into the lungs every 6 (six) hours as needed for wheezing or shortness of breath. 08/12/17   Ladell Pier, MD  CIALIS 5 MG tablet TAKE 1 TABLET BY MOUTH DAILY. 11/14/17   Ladell Pier, MD  cyclobenzaprine (FLEXERIL) 10 MG tablet Take 1 tablet (10 mg total) by mouth at bedtime. Patient not taking: Reported on 12/21/2017 09/18/17 12/17/17  Vevelyn Francois, NP  doxycycline (VIBRAMYCIN) 100 MG capsule Take 1 capsule (100 mg total) by mouth 2 (two) times daily. One po bid x 7 days 12/21/17   Milton Ferguson, MD  HYDROcodone-acetaminophen (NORCO/VICODIN) 5-325 MG  tablet Take 1 tablet by mouth every 6 (six) hours as needed for moderate pain. 12/21/17   Milton Ferguson, MD  traMADol (ULTRAM) 50 MG tablet Take 2 tablets (100 mg total) by mouth 4 (four) times daily. 1-2 tablets Patient not taking: Reported on 12/21/2017 09/23/17 12/22/17  Vevelyn Francois, NP    Family History Family History  Problem Relation Age of Onset  . Lung cancer Father        was a smoker  . Alcohol abuse Father   . Hypertension Mother   . Colon polyps Mother   . Breast cancer Paternal Grandmother   . Alcohol abuse Paternal Grandfather   . Colon cancer Neg Hx   . Rectal cancer Neg Hx   . Stomach cancer Neg Hx     Social History Social History   Tobacco Use  . Smoking status: Current Every Day Smoker    Packs/day: 0.50    Years:  30.00    Pack years: 15.00    Types: Cigarettes  . Smokeless tobacco: Never Used  . Tobacco comment: 11-30-2014 info given, Has cut back from 2 pks to 1/2 pack and "working on it"  Substance Use Topics  . Alcohol use: Yes    Alcohol/week: 0.0 standard drinks    Comment: occ; binge drinking occasionally  . Drug use: No    Comment: Opana     Allergies   Cozaar [losartan] and Lisinopril   Review of Systems Review of Systems  Constitutional: Negative for appetite change and fatigue.  HENT: Negative for congestion, ear discharge and sinus pressure.   Eyes: Negative for discharge.  Respiratory: Positive for shortness of breath. Negative for cough.   Cardiovascular: Positive for chest pain.  Gastrointestinal: Negative for abdominal pain and diarrhea.  Genitourinary: Negative for frequency and hematuria.  Musculoskeletal: Negative for back pain.  Skin: Negative for rash.  Neurological: Positive for weakness and headaches. Negative for seizures.  Psychiatric/Behavioral: Negative for hallucinations.     Physical Exam Updated Vital Signs BP (!) 138/104 (BP Location: Right Arm)   Pulse 67   Temp 98 F (36.7 C) (Oral)   Resp 19   SpO2 94%   Physical Exam  Constitutional: He is oriented to person, place, and time. He appears well-developed.  HENT:  Head: Normocephalic.  Eyes: Conjunctivae and EOM are normal. No scleral icterus.  Neck: Neck supple. No thyromegaly present.  Cardiovascular: Normal rate and regular rhythm. Exam reveals no gallop and no friction rub.  No murmur heard. Pulmonary/Chest: No stridor. He has no wheezes. He has no rales. He exhibits no tenderness.  Abdominal: He exhibits no distension. There is no tenderness. There is no rebound.  Musculoskeletal: Normal range of motion. He exhibits no edema.  Lymphadenopathy:    He has no cervical adenopathy.  Neurological: He is oriented to person, place, and time. He exhibits normal muscle tone. Coordination normal.    Skin: No rash noted. No erythema.  Psychiatric: He has a normal mood and affect. His behavior is normal.     ED Treatments / Results  Labs (all labs ordered are listed, but only abnormal results are displayed) Labs Reviewed  BASIC METABOLIC PANEL - Abnormal; Notable for the following components:      Result Value   Sodium 130 (*)    Chloride 90 (*)    Glucose, Bld 117 (*)    BUN <5 (*)    Creatinine, Ser 0.60 (*)    Anion gap 17 (*)  All other components within normal limits  CBC - Abnormal; Notable for the following components:   WBC 11.0 (*)    RBC 6.58 (*)    Hemoglobin 19.1 (*)    HCT 53.8 (*)    All other components within normal limits  CBC WITH DIFFERENTIAL/PLATELET - Abnormal; Notable for the following components:   WBC 11.1 (*)    RBC 6.42 (*)    Hemoglobin 18.8 (*)    HCT 53.2 (*)    Neutro Abs 7.9 (*)    Abs Immature Granulocytes 0.14 (*)    All other components within normal limits  HEPATIC FUNCTION PANEL  I-STAT TROPONIN, ED    EKG EKG Interpretation  Date/Time:  Sunday December 21 2017 16:10:30 EDT Ventricular Rate:  121 PR Interval:    QRS Duration: 110 QT Interval:  359 QTC Calculation: 432 R Axis:   49 Text Interpretation:  Sinus tachycardia Ventricular bigeminy LAE, consider biatrial enlargement RSR' in V1 or V2, probably normal variant Minimal ST depression, inferior leads Minimal ST elevation, lateral leads Confirmed by [place] Ferguson [phone]) on 12/21/2017 9:16:05 PM   Radiology Dg Chest 2 View  Result Date: 12/21/2017 CLINICAL DATA:  Pt c/o headache, shortness of breath, sternal chest pain, pain between scapula, and anxiety x 1 week. H/o COPD, Irregular heart beat, HTN. Smoker. EXAM: CHEST - 2 VIEW COMPARISON:  Chest x-rays dated 12/17/2017, 07/22/2014 and 04/12/2014. FINDINGS: Heart size and mediastinal contours are within normal limits. Lungs are clear. No pleural effusion or pneumothorax seen. No acute or suspicious osseous finding.  Anterior cervical fusion hardware again noted in the lower cervical spine. Again noted is the discontinuity of several screws, as previously described. IMPRESSION: No active cardiopulmonary disease. No evidence of pneumonia or pulmonary edema. Electronically Signed   By: Franki Cabot M.D.   On: 12/21/2017 16:36   Ct Angio Chest Pe W And/or Wo Contrast  Result Date: 12/21/2017 CLINICAL DATA:  Headache and dyspnea with sternal chest pain and anxiety x1 week. EXAM: CT ANGIOGRAPHY CHEST WITH CONTRAST TECHNIQUE: Multidetector CT imaging of the chest was performed using the standard protocol during bolus administration of intravenous contrast. Multiplanar CT image reconstructions and MIPs were obtained to evaluate the vascular anatomy. CONTRAST:  170mL ISOVUE-370 IOPAMIDOL (ISOVUE-370) INJECTION 76% COMPARISON:  12/09/2011 CT, chest radiograph 12/21/2017 FINDINGS: Cardiovascular: Satisfactory opacification of the pulmonary arteries to the segmental level. No evidence of pulmonary embolism. Normal heart size. No pericardial effusion. Mild aortic atherosclerosis without aneurysm or dissection. Mediastinum/Nodes: No enlarged mediastinal, hilar, or axillary lymph nodes. Thyroid gland, trachea, and esophagus demonstrate no significant findings. Lungs/Pleura: Centrilobular emphysema noted bilaterally. Slightly thinner appearing 4 mm in length ovoid density in the left upper lobe. No dominant mass, consolidation, effusion or pneumothorax. Minimal bibasilar dependent atelectasis. Upper Abdomen: No acute abnormality. Musculoskeletal: Large central left central disc-osteophyte complex causing moderate central canal stenosis at T7-T8. No significant foraminal encroachment. No acute osseous abnormality. ACDF of the lower cervical spine. Known fractures of the fixation screws are excluded on this study. Review of the MIP images confirms the above findings. IMPRESSION: Centrilobular emphysema. No active pulmonary disease.  Slightly thinner appearing 4 mm left upper lobe pulmonary nodular density. No acute pulmonary embolus, aortic aneurysm or dissection. Large central disc-osteophyte complex causing moderate central canal stenosis and T7-8. Aortic Atherosclerosis (ICD10-I70.0) and Emphysema (ICD10-J43.9). Electronically Signed   By: Ashley Royalty M.D.   On: 12/21/2017 19:17    Procedures Procedures (including critical care time)  Medications Ordered in ED  Medications  sodium chloride 0.9 % injection (has no administration in time range)  iopamidol (ISOVUE-370) 76 % injection (has no administration in time range)  ipratropium-albuterol (DUONEB) 0.5-2.5 (3) MG/3ML nebulizer solution 3 mL (3 mLs Nebulization Given 12/21/17 1650)  albuterol (PROVENTIL) (2.5 MG/3ML) 0.083% nebulizer solution 2.5 mg (2.5 mg Nebulization Given 12/21/17 1650)  iopamidol (ISOVUE-370) 76 % injection 100 mL (100 mLs Intravenous Contrast Given 12/21/17 1836)     Initial Impression / Assessment and Plan / ED Course  I have reviewed the triage vital signs and the nursing notes.  Pertinent labs & imaging results that were available during my care of the patient were reviewed by me and considered in my medical decision making (see chart for details).    CT chest unremarkable.  Pleuritic chest pain with cough possibly related to URI.  Labs unremarkable.  Doubt coronary artery disease causing discomfort.  Patient will be treated with doxycycline and pain medicine will follow-up PCP  Final Clinical Impressions(s) / ED Diagnoses   Final diagnoses:  Bronchitis    ED Discharge Orders         Ordered    doxycycline (VIBRAMYCIN) 100 MG capsule  2 times daily     12/21/17 2127    HYDROcodone-acetaminophen (NORCO/VICODIN) 5-325 MG tablet  Every 6 hours PRN     12/21/17 2127           Milton Ferguson, MD 12/21/17 2131

## 2017-12-21 NOTE — ED Notes (Signed)
Pt updated in regards of next to go to CT

## 2017-12-21 NOTE — Discharge Instructions (Addendum)
Follow-up with your doctor this week if not improving 

## 2017-12-23 ENCOUNTER — Encounter: Payer: Self-pay | Admitting: Internal Medicine

## 2017-12-23 ENCOUNTER — Ambulatory Visit: Payer: Self-pay | Attending: Internal Medicine | Admitting: Internal Medicine

## 2017-12-23 VITALS — BP 131/84 | HR 58 | Temp 98.3°F | Resp 16 | Wt 222.0 lb

## 2017-12-23 DIAGNOSIS — E785 Hyperlipidemia, unspecified: Secondary | ICD-10-CM | POA: Insufficient documentation

## 2017-12-23 DIAGNOSIS — M545 Low back pain: Secondary | ICD-10-CM

## 2017-12-23 DIAGNOSIS — D751 Secondary polycythemia: Secondary | ICD-10-CM | POA: Insufficient documentation

## 2017-12-23 DIAGNOSIS — E876 Hypokalemia: Secondary | ICD-10-CM | POA: Insufficient documentation

## 2017-12-23 DIAGNOSIS — F17200 Nicotine dependence, unspecified, uncomplicated: Secondary | ICD-10-CM

## 2017-12-23 DIAGNOSIS — Z888 Allergy status to other drugs, medicaments and biological substances status: Secondary | ICD-10-CM | POA: Insufficient documentation

## 2017-12-23 DIAGNOSIS — K219 Gastro-esophageal reflux disease without esophagitis: Secondary | ICD-10-CM | POA: Insufficient documentation

## 2017-12-23 DIAGNOSIS — I10 Essential (primary) hypertension: Secondary | ICD-10-CM | POA: Insufficient documentation

## 2017-12-23 DIAGNOSIS — Z79899 Other long term (current) drug therapy: Secondary | ICD-10-CM | POA: Insufficient documentation

## 2017-12-23 DIAGNOSIS — M5412 Radiculopathy, cervical region: Secondary | ICD-10-CM | POA: Insufficient documentation

## 2017-12-23 DIAGNOSIS — G629 Polyneuropathy, unspecified: Secondary | ICD-10-CM | POA: Insufficient documentation

## 2017-12-23 DIAGNOSIS — F17210 Nicotine dependence, cigarettes, uncomplicated: Secondary | ICD-10-CM | POA: Insufficient documentation

## 2017-12-23 DIAGNOSIS — Z79891 Long term (current) use of opiate analgesic: Secondary | ICD-10-CM | POA: Insufficient documentation

## 2017-12-23 DIAGNOSIS — G894 Chronic pain syndrome: Secondary | ICD-10-CM | POA: Insufficient documentation

## 2017-12-23 DIAGNOSIS — R0683 Snoring: Secondary | ICD-10-CM | POA: Insufficient documentation

## 2017-12-23 DIAGNOSIS — Z23 Encounter for immunization: Secondary | ICD-10-CM | POA: Insufficient documentation

## 2017-12-23 DIAGNOSIS — G8929 Other chronic pain: Secondary | ICD-10-CM

## 2017-12-23 DIAGNOSIS — E559 Vitamin D deficiency, unspecified: Secondary | ICD-10-CM | POA: Insufficient documentation

## 2017-12-23 DIAGNOSIS — I420 Dilated cardiomyopathy: Secondary | ICD-10-CM | POA: Insufficient documentation

## 2017-12-23 DIAGNOSIS — R110 Nausea: Secondary | ICD-10-CM | POA: Insufficient documentation

## 2017-12-23 MED ORDER — GABAPENTIN 600 MG PO TABS: 600.0000 mg | ORAL_TABLET | Freq: Three times a day (TID) | ORAL | 1 refills | Status: DC

## 2017-12-23 MED ORDER — NICOTINE 14 MG/24HR TD PT24: 14.0000 mg | MEDICATED_PATCH | Freq: Every day | TRANSDERMAL | 0 refills | Status: DC

## 2017-12-23 MED ORDER — NICOTINE 21 MG/24HR TD PT24: 21.0000 mg | MEDICATED_PATCH | Freq: Every day | TRANSDERMAL | 0 refills | Status: DC

## 2017-12-23 MED ORDER — PROMETHAZINE HCL 12.5 MG PO TABS: 12.5000 mg | ORAL_TABLET | Freq: Every day | ORAL | 1 refills | Status: DC | PRN

## 2017-12-23 NOTE — Progress Notes (Signed)
Patient ID: CASYN BECVAR, male    DOB: [birth year]  MRN: [id number]  CC: Follow-up (3 month ) and Hospitalization Follow-up (ED )   Subjective: Mark [name] is a 52 y.o. 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  Had CTA of the chest that revealed emphysematous changes otherwise no acute pulmonary disease.  Incidental finding of 4 mm left upper lobe lung nodule that is unchanged since 2013.  Troponin was negative.  EKG revealed ventricular bigeminy.  Sodium level was slightly low at 130, potassium and calcium levels were normal.  Patient placed on doxycycline for possible bronchitis.  Today he reports that the cough and chest pain is better.  Smokes 1/2 pk a day.  Smoked for 40 + yrs.  Quit in his early 42s for 3-4 mths.  Started again because he was around people who smoked Chantix caused bad dreams.  Used patches in past and found them helpful.  He would like to be placed on the nicotine patches but admits that he may not be able to afford as he is unable to afford all of his medicines every month.  Saw his cardiologist  Dr Terrence Dupont less than a week ago.  Blood pressure found to be elevated on that visit.  Patient states that he was started on a new blood pressure medication but he does not recall the name of it.  He was given 2 weeks sample of it and has a prescription for subsequent refills.  He will call us back with the name of that medicine.    Chronic radicular cervical and lumbar pain: Patient saw me in June and said that he no longer wanted to go back to [facility] pain clinic.  We did a controlled substance agreement with him and started him on Tyl#3.  Urine drug screen was positive for benzodiazepine.  I tried to reach him to  discuss the urine drug screen results but was unable to reach him.  Today he states that the urine was positive for benzos because he had some left over that his cardiologist had given him years ago and had taken one that day for anxiety. Patient subsequently went back to [facility] pain clinic even though he had signed a controlled substance agreement with me and continue to receive tramadol from them.  He had called [place] in August wanting a refill on Tyl#3 and I denied it.  Patient was subsequently discharged from [facility] pain clinic -Patient states that he did not intend to deceive anybody, that he was just trying to get some relief for his chronic pain.  He is wanting to know what he can do now for pain medication.  Patient is requesting to be weaned to a lower dose of gabapentin as he feels that it affects his memory.  Prescription on med list is written for 800 mg 4 times a day but patient states that he is only been taking it 3 times a day.    C/o nausea all the time and requests a prescription for Phenergan to use as needed.  Patient has history of GERD and had [place] fundoplication surgery done in the past by Dr. Hassell Done.  He reports that nausea has been worse over the past year.  Also reports increased heartburn  over the past 2 weeks.  He avoids foods that cause heartburn.  He purchased and started using Prilosec OTC 2 days ago.      Patient with increased hemoglobin/hematocrit on CBC consistently over the past several months.   Endorses snoring and witnessed apnea episodes during sleep.  Reports being referred for sleep study several years ago but did not go due to his work schedule at the time.  Endorses daytime sleepiness and morning headaches.  Patient Active Problem List   Diagnosis Date Noted  . Pain management contract broken 12/15/2017  . Dyspepsia   . Opioid-induced hyperalgesia (with oxycodone) 02/11/2017  . Allodynia (Lower extremities) 12/26/2016  . Hyperalgesia (Lower extremities)  12/26/2016  . Sympathetic pain (lower extremity) 09/26/2016  . Lower extremity neuropathy (Primary Source of Pain) (Bilateral) (R>L) 09/26/2016  . Lumbar facet syndrome (Bilateral) (R>L) 08/22/2016  . Chronic sacroiliac joint pain (Bilateral) (R>L) 08/22/2016  . Chronic lower extremity pain (Bilateral) (R>L) 06/26/2016  . Opiate use 05/14/2016  . Occipital headache (Bilateral) (R>L) 04/16/2016  . Musculoskeletal pain 04/15/2016  . 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 Major depressive disorder, recurrent episode, moderate ([place]) 04/10/2016  . Long term current use of opiate analgesic 03/18/2016  . Long term prescription opiate use 03/18/2016  . Chronic neck pain (Bilateral)(R>L) 03/18/2016  . Cervical fusion syndrome 03/18/2016  . 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 Chronic knee pain  (Bilateral) (R>L) 03/18/2016  . GAD (generalized anxiety disorder) 12/06/2015  . Alcohol use disorder, severe, in early remission ([place]) 12/06/2015  . GERD (gastroesophageal reflux disease) 07/24/2015  . Chronic anxiety 12/19/2014  . Erectile dysfunction 09/16/2014  . Poor dentition 07/28/2014  . Chronic maxillary sinusitis 07/28/2014  . Vitamin D insufficiency 07/05/2014  . Chronic fatigue 07/04/2014  . Chronic radicular low back pain [facility] source of pain) (Right) (to calf) 06/02/2014  . Chronic shoulder pain (Right) 05/12/2014  . Alcohol use disorder, severe, dependence ([place])   . 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 Hypokalemia   . Opioid dependence ([place]) 01/22/2014    Class: Acute  . Paroxysmal VT ([place]) 12/19/2013  . Chronic low back pain (Secondary source of pain) (Bilateral) (R>L) 12/28/2012  . Chronic pain syndrome 12/28/2012  . Diarrhea 12/28/2012  . Alcohol intoxication ([place]) 06/09/2012  . 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 Nonsustained ventricular tachycardia ([place]) 01/20/2012  . Tobacco abuse 01/19/2012  . Essential hypertension   .  Hyperlipidemia      Current Outpatient Medications on File Prior to Visit  Medication Sig Dispense Refill  . albuterol (VENTOLIN HFA) 108 (90 Base) MCG/ACT inhaler Inhale 2 puffs into the lungs every 6 (six) hours as needed for wheezing or shortness of breath. 18 g 3  . CIALIS 5 MG tablet TAKE 1 TABLET BY MOUTH DAILY. 30 tablet 0  . cyclobenzaprine (FLEXERIL) 10 MG tablet Take 1 tablet (10 mg total) by mouth at bedtime. (Patient not taking: Reported on 12/21/2017) 30 tablet 2  . cyclobenzaprine (FLEXERIL) 10 MG tablet Take 10 mg by mouth 3 (three) times daily as needed for muscle spasms.    Marland Kitchen doxycycline (VIBRAMYCIN) 100 MG capsule Take 1 capsule (100 mg total) by mouth 2 (two) times daily. One po bid x 7 days 14 capsule 0  . HYDROcodone-acetaminophen (NORCO/VICODIN) 5-325 MG tablet Take 1 tablet by mouth every 6 (six) hours as needed for moderate pain. 20 tablet 0  . metoprolol (LOPRESSOR) 50 MG tablet Take 1 tablet (50 mg total) by mouth  2 (two) times daily. (Patient taking differently: Take 50 mg by mouth 3 (three) times daily. ) 60 tablet 11  . traMADol (ULTRAM) 50 MG tablet Take 2 tablets (100 mg total) by mouth 4 (four) times daily. 1-2 tablets (Patient not taking: Reported on 12/21/2017) 240 tablet 2  . traZODone (DESYREL) 100 MG tablet Take 1 tablet (100 mg total) by mouth at bedtime as needed. (Patient taking differently: Take 50-100 mg by mouth at bedtime as needed for sleep. ) 30 tablet 3   No current facility-administered medications on file prior to visit.     Allergies  Allergen Reactions  . Cozaar [Losartan] Anaphylaxis  . Lisinopril Anaphylaxis    Social History   Socioeconomic History  . Marital status: Divorced    Spouse name: Not on file  . Number of children: 2  . Years of education: Not on file  . Highest education level: Not on file  Occupational History  . Occupation: plum[employer]   Social Needs  . Financial resource strain: Not on file  . Food insecurity:     Worry: Not on file    Inability: Not on file  . Transportation needs:    Medical: Not on file    Non-medical: Not on file  Tobacco Use  . Smoking status: Current Every Day Smoker    Packs/day: 0.50    Years: 30.00    Pack years: 15.00    Types: Cigarettes  . Smokeless tobacco: Never Used  . Tobacco comment: 11-30-2014 info given, Has cut back from 2 pks to 1/2 pack and "working on it"  Substance and Sexual Activity  . Alcohol use: Yes    Alcohol/week: 0.0 standard drinks    Comment: occ; binge drinking occasionally  . Drug use: No    Comment: Opana  . Sexual activity: Not Currently  Lifestyle  . Physical activity:    Days per week: Not on file    Minutes per session: Not on file  . Stress: Not on file  Relationships  . Social connections:    Talks on phone: Not on file    Gets together: Not on file    Attends religious service: Not on file    Active member of club or organization: Not on file    Attends meetings of clubs or organizations: Not on file    Relationship status: Not on file  . Intimate partner violence:    Fear of current or ex partner: Not on file    Emotionally abused: Not on file    Physically abused: Not on file    Forced sexual activity: Not on file  Other Topics Concern  . Not on file  Social History Narrative  . Not on file    Family History  Problem Relation Age of Onset  . Lung cancer Father        was a smoker  . Alcohol abuse Father   . Hypertension Mother   . Colon polyps Mother   . Breast cancer Paternal Grandmother   . Alcohol abuse Paternal Grandfather   . Colon cancer Neg Hx   . Rectal cancer Neg Hx   . Stomach cancer Neg Hx     Past Surgical History:  Procedure Laterality Date  . [place] STUDY N/A 01/16/2015   Procedure: [place] STUDY;  Surgeon: Manus Gunning, MD;  Location: [facility];  Service: Gastroenterology;  Laterality: N/A;  . [place]  . COLONOSCOPY  1998  .  LEFT HEART CATHETERIZATION  WITH CORONARY ANGIOGRAM N/A 01/13/2014   Procedure: LEFT HEART CATHETERIZATION WITH CORONARY ANGIOGRAM;  Surgeon: Clent Demark, MD;  Location: [place] [facility];  Service: Cardiovascular;  Laterality: N/A;  . NASAL SINUS SURGERY    . NECK SURGERY  2000   cervial fusion   . [place] FUNDOPLICATION    . SUBACROMIAL DECOMPRESSION Right 11/16/2014   Procedure: RIGHT SHOULDER ARTHROSCOPY WITH SUBACROMIAL DECOMPRESSION ;  Surgeon: Leandrew Koyanagi, MD;  Location: [place] [facility];  Service: Orthopedics;  Laterality: Right;  . UPPER GASTROINTESTINAL ENDOSCOPY      ROS: Review of Systems Negative except as above PHYSICAL EXAM: BP 131/84   Pulse (!) 58   Temp 98.3 F (36.8 C) (Oral)   Resp 16   Wt 222 lb (100.7 kg)   SpO2 96%   BMI 27.02 kg/m   Physical Exam  General appearance - alert, well appearing, middle-aged Caucasian male and in no distress Mental status - normal mood, behavior, speech, dress, motor activity, and thought processes Chest - clear to auscultation, no wheezes, rales or rhonchi, symmetric air entry Heart - normal rate, regular rhythm, normal S1, S2, no murmurs, rubs, clicks or gallops Abdomen - soft, nontender, nondistended, no masses or organomegaly Extremities - peripheral pulses normal, no pedal edema, no clubbing or cyanosis  Lab Results  Component Value Date   WBC 11.0 (H) 12/21/2017   HGB 19.1 (H) 12/21/2017   HCT 53.8 (H) 12/21/2017   MCV 81.8 12/21/2017   PLT 335 12/21/2017     Chemistry      Component Value Date/Time   NA 130 (L) 12/21/2017 1632   NA 139 04/09/2017 1022   NA 138 06/09/2013 1402   K 3.5 12/21/2017 1632   K 4.7 06/09/2013 1402   CL 90 (L) 12/21/2017 1632   CL 105 06/09/2013 1402   CO2 23 12/21/2017 1632   CO2 27 06/09/2013 1402   BUN <5 (L) 12/21/2017 1632   BUN 12 04/09/2017 1022   BUN 9 06/09/2013 1402   CREATININE 0.60 (L) 12/21/2017 1632   CREATININE 0.84 12/15/2015 1646      Component Value Date/Time   CALCIUM 10.1  12/21/2017 1632   CALCIUM 8.6 06/09/2013 1402   ALKPHOS 114 12/21/2017 1634   AST 34 12/21/2017 1634   ALT 39 12/21/2017 1634   BILITOT 0.7 12/21/2017 1634   BILITOT 0.5 04/09/2017 1022      ASSESSMENT AND PLAN: 1. Gastroesophageal reflux disease without esophagitis GERD precautions discussed.  Advised patient to increase Prilosec to twice a day if only partial relief with once a day dosing.  Given his history of  Fundoplication surgery, will refer to GI - Ambulatory referral to Gastroenterology  2. Nausea in adult We will give a limited amount of Phenergan to use as needed - Ambulatory referral to Gastroenterology - promethazine (PHENERGAN) 12.5 MG tablet; Take 1 tablet (12.5 mg total) by mouth daily as needed for nausea or vomiting. Each prescription to last a mth  Dispense: 15 tablet; Refill: 1  3. Need for immunization against influenza - Flu Vaccine QUAD 36+ mos IM  4. Cervical radiculopathy 5. Chronic midline low back pain, unspecified whether sciatica present -Advised patient that we can try to refer him to [facility] or another pain clinic if they would accept him.  Patient was aware of condition governing his controlled substance prescribing agreement and understands why he was terminated.  Also advised against taking old leftover prescriptions like benzos that  he reportedly still had from years ago - Ambulatory referral to Pain Clinic  6. Essential hypertension Close to goal.  He will call us back with the name of the new medicine that his cardiologist placed him on 7. Tobacco dependence Advised to quit.  He is wanting to try the nicotine patches.  We discussed stepdown approach. - nicotine (NICODERM CQ - DOSED IN MG/24 HOURS) 21 mg/24hr patch; Place 1 patch (21 mg total) onto the skin daily.  Dispense: 28 patch; Refill: 0 - nicotine (NICODERM CQ - DOSED IN MG/24 HOURS) 14 mg/24hr patch; Place 1 patch (14 mg total) onto the skin daily.  Dispense: 28 patch; Refill: 0  8.  Erythrocytosis Could be due to chronic hypoxia associated with sleep apnea.  Will refer for sleep study - Transferrin Saturation - Ferritin  9. Loud snoring - PSG Sleep Study; Future  10. Neuropathy, Peripheral (Feet) ([name]) (Location of Primary Source of Pain) (Bilateral) (R>L) Given his concern about memory he is agreeable to decreasing the gabapentin from 800 mg 3 times a day to 600 mg 3 times a day. - gabapentin (NEURONTIN) 600 MG tablet; Take 1 tablet (600 mg total) by mouth 3 (three) times daily.  Dispense: 90 tablet; Refill: 1  Patient was given the opportunity to ask questions.  Patient verbalized understanding of the plan and was able to repeat key elements of the plan.   Orders Placed This Encounter  Procedures  . Flu Vaccine QUAD 36+ mos IM  . Transferrin Saturation  . Ferritin  . Ambulatory referral to Gastroenterology  . Ambulatory referral to Pain Clinic  . PSG Sleep Study     Requested Prescriptions   Signed Prescriptions Disp Refills  . nicotine (NICODERM CQ - DOSED IN MG/24 HOURS) 21 mg/24hr patch 28 patch 0    Sig: Place 1 patch (21 mg total) onto the skin daily.  . nicotine (NICODERM CQ - DOSED IN MG/24 HOURS) 14 mg/24hr patch 28 patch 0    Sig: Place 1 patch (14 mg total) onto the skin daily.  Marland Kitchen gabapentin (NEURONTIN) 600 MG tablet 90 tablet 1    Sig: Take 1 tablet (600 mg total) by mouth 3 (three) times daily.  . promethazine (PHENERGAN) 12.5 MG tablet 15 tablet 1    Sig: Take 1 tablet (12.5 mg total) by mouth daily as needed for nausea or vomiting. Each prescription to last a mth    Return in about 3 months (around 03/25/2018).  Karle Plumber, MD, FACP

## 2017-12-23 NOTE — Patient Instructions (Addendum)
Start the nicotine patches as discussed.  Start with the 21 mg patches and after 1 month step down to the 14 mg.  Decrease gabapentin to 600 mg 3 times a day.  Take the Prilosec once a day in the mornings before breakfast.  If this does not give complete relief of your symptoms then increase it to twice a day.  I have referred you for a sleep study.  Use the Phenergan as needed for the nausea.  Influenza Virus Vaccine injection (Fluarix) What is this medicine? INFLUENZA VIRUS VACCINE (in floo EN zuh VAHY ruhs vak SEEN) helps to reduce the risk of getting influenza also known as the flu. This medicine may be used for other purposes; ask your health care provider or pharmacist if you have questions. COMMON BRAND NAME(S): Fluarix, Fluzone What should I tell my health care provider before I take this medicine? They need to know if you have any of these conditions: -bleeding disorder like hemophilia -fever or infection -Guillain-Barre syndrome or other neurological problems -immune system problems -infection with the human immunodeficiency virus (HIV) or AIDS -low blood platelet counts -multiple sclerosis -an unusual or allergic reaction to influenza virus vaccine, eggs, chicken proteins, latex, gentamicin, other medicines, foods, dyes or preservatives -pregnant or trying to get pregnant -breast-feeding How should I use this medicine? This vaccine is for injection into a muscle. It is given by a health care professional. A copy of Vaccine Information Statements will be given before each vaccination. Read this sheet carefully each time. The sheet may change frequently. Talk to your pediatrician regarding the use of this medicine in children. Special care may be needed. Overdosage: If you think you have taken too much of this medicine contact a poison control center or emergency room at once. NOTE: This medicine is only for you. Do not share this medicine with others. What if I miss a  dose? This does not apply. What may interact with this medicine? -chemotherapy or radiation therapy -medicines that lower your immune system like etanercept, anakinra, infliximab, and adalimumab -medicines that treat or prevent blood clots like warfarin -phenytoin -steroid medicines like prednisone or cortisone -theophylline -vaccines This list may not describe all possible interactions. Give your health care provider a list of all the medicines, herbs, non-prescription drugs, or dietary supplements you use. Also tell them if you smoke, drink alcohol, or use illegal drugs. Some items may interact with your medicine. What should I watch for while using this medicine? Report any side effects that do not go away within 3 days to your doctor or health care professional. Call your health care provider if any unusual symptoms occur within 6 weeks of receiving this vaccine. You may still catch the flu, but the illness is not usually as bad. You cannot get the flu from the vaccine. The vaccine will not protect against colds or other illnesses that may cause fever. The vaccine is needed every year. What side effects may I notice from receiving this medicine? Side effects that you should report to your doctor or health care professional as soon as possible: -allergic reactions like skin rash, itching or hives, swelling of the face, lips, or tongue Side effects that usually do not require medical attention (report to your doctor or health care professional if they continue or are bothersome): -fever -headache -muscle aches and pains -pain, tenderness, redness, or swelling at site where injected -weak or tired This list may not describe all possible side effects. Call your doctor for medical  advice about side effects. You may report side effects to FDA at 1-800-FDA-1088. Where should I keep my medicine? This vaccine is only given in a clinic, pharmacy, doctor's office, or other health care setting and  will not be stored at home. NOTE: This sheet is a summary. It may not cover all possible information. If you have questions about this medicine, talk to your doctor, pharmacist, or health care provider.  2018 Elsevier/Gold Standard (2007-09-23 09:30:40)

## 2017-12-23 NOTE — Progress Notes (Signed)
Pt states his pain is coming from everywhere

## 2017-12-24 MED FILL — PROMETHAZINE 12.5 MG TABLET: 12.5 | 30 days supply | Qty: 15 | Fill #0

## 2017-12-24 MED FILL — GABAPENTIN 600 MG TABLET: 600 | 30 days supply | Qty: 90 | Fill #0

## 2017-12-25 LAB — TRANSFERRIN SATURATION
IRON SATN MFR SERPL: 35 % Saturation
IRON SERPL-MCNC: 163 ug/dL — AB
TRANSFERRIN SERPL-MCNC: 330 mg/dL

## 2017-12-25 LAB — FERRITIN: FERRITIN: 143 ng/mL (ref 30–400)

## 2017-12-29 ENCOUNTER — Emergency Department (HOSPITAL_COMMUNITY)
Admission: EM | Admit: 2017-12-29 | Discharge: 2017-12-29 | Disposition: A | Discharge status: Home / Self Care | Payer: Self-pay | Attending: Emergency Medicine | Admitting: Emergency Medicine

## 2017-12-29 ENCOUNTER — Encounter (HOSPITAL_COMMUNITY): Payer: Self-pay | Admitting: Family Medicine

## 2017-12-29 ENCOUNTER — Other Ambulatory Visit: Payer: Self-pay

## 2017-12-29 ENCOUNTER — Emergency Department (HOSPITAL_COMMUNITY): Payer: Self-pay

## 2017-12-29 DIAGNOSIS — F17210 Nicotine dependence, cigarettes, uncomplicated: Secondary | ICD-10-CM | POA: Insufficient documentation

## 2017-12-29 DIAGNOSIS — W109XXA Fall (on) (from) unspecified stairs and steps, initial encounter: Secondary | ICD-10-CM | POA: Insufficient documentation

## 2017-12-29 DIAGNOSIS — Y9289 Other specified places as the place of occurrence of the external cause: Secondary | ICD-10-CM | POA: Insufficient documentation

## 2017-12-29 DIAGNOSIS — M542 Cervicalgia: Secondary | ICD-10-CM

## 2017-12-29 DIAGNOSIS — Y998 Other external cause status: Secondary | ICD-10-CM | POA: Insufficient documentation

## 2017-12-29 DIAGNOSIS — S199XXA Unspecified injury of neck, initial encounter: Secondary | ICD-10-CM | POA: Insufficient documentation

## 2017-12-29 DIAGNOSIS — W19XXXA Unspecified fall, initial encounter: Secondary | ICD-10-CM

## 2017-12-29 DIAGNOSIS — F1010 Alcohol abuse, uncomplicated: Secondary | ICD-10-CM | POA: Insufficient documentation

## 2017-12-29 DIAGNOSIS — Y9389 Activity, other specified: Secondary | ICD-10-CM | POA: Insufficient documentation

## 2017-12-29 MED ORDER — CHLORDIAZEPOXIDE HCL 25 MG PO CAPS: ORAL_CAPSULE | ORAL | 0 refills | Status: DC

## 2017-12-29 MED ORDER — OXYCODONE-ACETAMINOPHEN 5-325 MG PO TABS
1.0000 | ORAL_TABLET | Freq: Once | ORAL | Status: AC
  Administered 2017-12-29: 1 via ORAL
  Filled 2017-12-29: qty 1

## 2017-12-29 MED ORDER — LORAZEPAM 1 MG PO TABS
2.0000 mg | ORAL_TABLET | Freq: Once | ORAL | Status: AC
  Administered 2017-12-29: 2 mg via ORAL
  Filled 2017-12-29: qty 2

## 2017-12-29 MED ORDER — PROMETHAZINE HCL 25 MG PO TABS
12.5000 mg | ORAL_TABLET | Freq: Once | ORAL | Status: AC
  Administered 2017-12-29: 12.5 mg via ORAL
  Filled 2017-12-29: qty 1

## 2017-12-29 NOTE — ED Triage Notes (Signed)
Patient reports he is wanting detox from alcohol. Patient usually drinks about a case of beer a day. Last drink was one beer at 11:30 and a few beers earlier this morning at 7:30. Last night, he tripped and is complaining of neck pain. Pain radiates to shoulders, down back, and hips. Numbness in the upper extremities.

## 2017-12-29 NOTE — ED Provider Notes (Signed)
[facility] DEPT Provider Note   CSN: [id number] Arrival date & time: 12/29/17  1255     History   Chief Complaint Chief Complaint  Patient presents with  . Fall    HPI Mark [name] is a 52 y.o. male.  HPI   52 year old male with neck pain.  Patient is an alcoholic.  He was drinking heavily when he lost his balance try to go up some steps.  He tripped and fell forward.  He struck his head and says it was forced backwards.  He has had pain in his neck and his upper back and shoulder since then.  He reports he has chronic pain which is recent he drinks so much.  He reports that the pain he is having in his neck is new though.  His last drink was around 11:30 AM today.  Past Medical History:  Diagnosis Date  . Alcohol withdrawal ([place])   . Allergy   . Anxiety Dx 2014  . COPD (chronic obstructive pulmonary disease) ([place])   . Drug abuse ([place])    pt reports opioid dependence due to previous back surgeries  . Enlarged prostate   . ETOH abuse   . GERD (gastroesophageal reflux disease) Dx 2003  . Headache(784.0)   . Hepatitis   . Hyperlipidemia Dx 2000  . Hypertension Dx 2011  . IBS (irritable bowel syndrome)    alternates constiaption/ diarrhea  . Irregular heart beat   . 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 Mental disorder   . Neuromuscular disorder ([place])    hands hurt   . Neuropathy 06/07/2013  . Seizure due to alcohol withdrawal ([place]) 01/23/2014   Per patient report  . Shortness of breath   . Withdrawal seizures ([place]) 2014   last seizure 2014 etoh with drawal    Patient Active Problem List   Diagnosis Date Noted  . Pain management contract broken 12/15/2017  . Dyspepsia   . Opioid-induced hyperalgesia (with oxycodone) 02/11/2017  . Allodynia (Lower extremities) 12/26/2016  . Hyperalgesia (Lower extremities) 12/26/2016  . Sympathetic pain (lower extremity) 09/26/2016  . Lower extremity neuropathy (Primary Source of Pain) (Bilateral)  (R>L) 09/26/2016  . Lumbar facet syndrome (Bilateral) (R>L) 08/22/2016  . Chronic sacroiliac joint pain (Bilateral) (R>L) 08/22/2016  . Chronic lower extremity pain (Bilateral) (R>L) 06/26/2016  . Opiate use 05/14/2016  . Occipital headache (Bilateral) (R>L) 04/16/2016  . Musculoskeletal pain 04/15/2016  . 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 Major depressive disorder, recurrent episode, moderate ([place]) 04/10/2016  . Long term current use of opiate analgesic 03/18/2016  . Long term prescription opiate use 03/18/2016  . Chronic neck pain (Bilateral)(R>L) 03/18/2016  . Cervical fusion syndrome 03/18/2016  . 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 Chronic knee pain  (Bilateral) (R>L) 03/18/2016  . GAD (generalized anxiety disorder) 12/06/2015  . Alcohol use disorder, severe, in early remission ([place]) 12/06/2015  . GERD (gastroesophageal reflux disease) 07/24/2015  . Chronic anxiety 12/19/2014  . Erectile dysfunction 09/16/2014  . Poor dentition 07/28/2014  . Chronic maxillary sinusitis 07/28/2014  . Vitamin D insufficiency 07/05/2014  . Chronic fatigue 07/04/2014  . Chronic radicular low back pain [facility] source of pain) (Right) (to calf) 06/02/2014  . Chronic shoulder pain (Right) 05/12/2014  . Alcohol use disorder, severe, dependence ([place])   . 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 Hypokalemia   . Opioid dependence ([place]) 01/22/2014    Class: Acute  . Paroxysmal VT ([place]) 12/19/2013  . Chronic low back  pain (Secondary source of pain) (Bilateral) (R>L) 12/28/2012  . Chronic pain syndrome 12/28/2012  . Diarrhea 12/28/2012  . Alcohol intoxication ([place]) 06/09/2012  . 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 Nonsustained ventricular tachycardia ([place]) 01/20/2012  . Tobacco abuse 01/19/2012  . Essential hypertension   . Hyperlipidemia     Past Surgical History:  Procedure Laterality Date  . [place] STUDY N/A 01/16/2015   Procedure: [place]  STUDY;  Surgeon: Manus Gunning, MD;  Location: [facility] ENDOS[facility];  Service: Gastroenterology;  Laterality: N/A;  . [place]  . COLONOSCOPY  1998  . LEFT HEART CATHETERIZATION WITH CORONARY ANGIOGRAM N/A 01/13/2014   Procedure: LEFT HEART CATHETERIZATION WITH CORONARY ANGIOGRAM;  Surgeon: Clent Demark, MD;  Location: [facility] CATH LAB;  Service: Cardiovascular;  Laterality: N/A;  . NASAL SINUS SURGERY    . NECK SURGERY  2000   cervial fusion   . NISSEN FUNDOPLICATION    . SHOULDER SURGERY Right   . SUBACROMIAL DECOMPRESSION Right 11/16/2014   Procedure: RIGHT SHOULDER ARTHROSCOPY WITH SUBACROMIAL DECOMPRESSION ;  Surgeon: Leandrew Koyanagi, MD;  Location: [facility];  Service: Orthopedics;  Laterality: Right;  . UPPER GASTROINTESTINAL ENDOSCOPY          Home Medications    Prior to Admission medications   Medication Sig Start Date End Date Taking? Authorizing Provider  albuterol (VENTOLIN HFA) 108 (90 Base) MCG/ACT inhaler Inhale 2 puffs into the lungs every 6 (six) hours as needed for wheezing or shortness of breath. 08/12/17  Yes Ladell Pier, MD  cyclobenzaprine (FLEXERIL) 10 MG tablet Take 1 tablet (10 mg total) by mouth at bedtime. 09/18/17 12/29/17 Yes Vevelyn Francois, NP  gabapentin (NEURONTIN) 600 MG tablet Take 1 tablet (600 mg total) by mouth 3 (three) times daily. 12/23/17  Yes Ladell Pier, MD  HYDROcodone-acetaminophen (NORCO/VICODIN) 5-325 MG tablet Take 1 tablet by mouth every 6 (six) hours as needed for moderate pain. 12/21/17  Yes Milton Ferguson, MD  metoprolol (LOPRESSOR) 50 MG tablet Take 1 tablet (50 mg total) by mouth 2 (two) times daily. Patient taking differently: Take 50 mg by mouth 3 (three) times daily.  07/24/15  Yes Funches, Josalyn, MD  promethazine (PHENERGAN) 12.5 MG tablet Take 1 tablet (12.5 mg total) by mouth daily as needed for nausea or vomiting. Each prescription to last a mth 12/23/17  Yes Ladell Pier, MD    traZODone (DESYREL) 100 MG tablet Take 1 tablet (100 mg total) by mouth at bedtime as needed. Patient taking differently: Take 50-100 mg by mouth at bedtime as needed for sleep.  08/21/17  Yes Ladell Pier, MD  CIALIS 5 MG tablet TAKE 1 TABLET BY MOUTH DAILY. Patient not taking: Reported on 12/29/2017 11/14/17   Ladell Pier, MD  cyclobenzaprine (FLEXERIL) 10 MG tablet Take 10 mg by mouth 3 (three) times daily as needed for muscle spasms.    [provider]  doxycycline (VIBRAMYCIN) 100 MG capsule Take 1 capsule (100 mg total) by mouth 2 (two) times daily. One po bid x 7 days Patient not taking: Reported on 12/29/2017 12/21/17   Milton Ferguson, MD  nicotine (NICODERM CQ - DOSED IN MG/24 HOURS) 14 mg/24hr patch Place 1 patch (14 mg total) onto the skin daily. Patient not taking: Reported on 12/29/2017 12/23/17   Ladell Pier, MD  nicotine (NICODERM CQ - DOSED IN MG/24 HOURS) 21 mg/24hr patch Place 1 patch (21  mg total) onto the skin daily. Patient not taking: Reported on 12/29/2017 12/23/17   Ladell Pier, MD  traMADol (ULTRAM) 50 MG tablet Take 2 tablets (100 mg total) by mouth 4 (four) times daily. 1-2 tablets Patient not taking: Reported on 12/29/2017 09/23/17 12/22/17  Vevelyn Francois, NP    Family History Family History  Problem Relation Age of Onset  . Lung cancer Father        was a smoker  . Alcohol abuse Father   . Hypertension Mother   . Colon polyps Mother   . Breast cancer Paternal Grandmother   . Alcohol abuse Paternal Grandfather   . Colon cancer Neg Hx   . Rectal cancer Neg Hx   . Stomach cancer Neg Hx     Social History Social History   Tobacco Use  . Smoking status: Current Every Day Smoker    Packs/day: 0.50    Years: 30.00    Pack years: 15.00    Types: Cigarettes  . Smokeless tobacco: Never Used  Substance Use Topics  . Alcohol use: Yes    Alcohol/week: 0.0 standard drinks    Comment: Case a beer a day   . Drug use: No     Comment: Opana     Allergies   Cozaar [losartan] and Lisinopril   Review of Systems Review of Systems  All systems reviewed and negative, other than as noted in HPI.  Physical Exam Updated Vital Signs BP (!) 158/77   Pulse 77   Temp 97.9 F (36.6 C) (Oral)   Resp 18   Ht 6\' 4"  (1.93 m)   Wt 100.7 kg   SpO2 97%   BMI 27.02 kg/m   Physical Exam  Constitutional: He appears well-developed and well-nourished. No distress.  HENT:  Head: Normocephalic and atraumatic.  Eyes: Conjunctivae are normal. Right eye exhibits no discharge. Left eye exhibits no discharge.  Cardiovascular: Normal rate, regular rhythm and normal heart sounds. Exam reveals no gallop and no friction rub.  No murmur heard. Pulmonary/Chest: Effort normal and breath sounds normal. No respiratory distress.  Abdominal: Soft. He exhibits no distension. There is no tenderness.  Musculoskeletal: He exhibits no edema or tenderness.  Posterior midline cervical spinal tenderness as well as lateral neck musculature bilaterally.  Neurological: He is alert.  Mild resting tremor.  Strength is 5 out of 5 bilateral upper extremities.  Sensation is intact to light touch.  Skin: Skin is warm and dry.  Psychiatric: He has a normal mood and affect. His behavior is normal. Thought content normal.  Nursing note and vitals reviewed.    ED Treatments / Results  Labs (all labs ordered are listed, but only abnormal results are displayed) Labs Reviewed - No data to display  EKG None  Radiology No results found.  Procedures Procedures (including critical care time)  Medications Ordered in ED Medications  oxyCODONE-acetaminophen (PERCOCET/ROXICET) 5-325 MG per tablet 1 tablet (1 tablet Oral Given 12/29/17 1535)  LORazepam (ATIVAN) tablet 2 mg (2 mg Oral Given 12/29/17 1535)  promethazine (PHENERGAN) tablet 12.5 mg (12.5 mg Oral Given 12/29/17 1535)     Initial Impression / Assessment and Plan / ED Course  I have  reviewed the triage vital signs and the nursing notes.  Pertinent labs & imaging results that were available during my care of the patient were reviewed by me and considered in my medical decision making (see chart for details).     52 year old male with a neck pain after  mechanical fall.  He does have midline cervical spine tenderness.  He drinks heavily including shortly before coming to the emergency room.  Will image. Pain meds. Mild tremor likely related to alcohol abuse. Ativan. Pt has chronic pain and abusing alcohol. If imaging is negative then would advise PRN NSAIDs. Pt requesting info regards etoh detox. Provided with prescription for librium taper and resource list.   Final Clinical Impressions(s) / ED Diagnoses   Final diagnoses:  Neck pain  Fall, initial encounter  Alcohol abuse    ED Discharge Orders    None       Virgel Manifold, MD 12/30/17 [phone]

## 2017-12-29 NOTE — ED Provider Notes (Signed)
Is alert and nontoxic.  CT scan no acute findings.  Plan per Dr. Wilson Singer was for discharge if scans within normal limits.  Patient is not tremulous.  He does not have any mental status changes.  His general parents is good.  Ports he has used Librium in the past and is familiar with it.  He denies that at this point he feels like he is having significant withdrawal symptoms.  He does not clinically appear to be in acute alcohol withdrawal.  Stable for discharge.   Charlesetta Shanks, MD 12/29/17 [phone]

## 2017-12-31 ENCOUNTER — Other Ambulatory Visit: Payer: Self-pay

## 2017-12-31 ENCOUNTER — Inpatient Hospital Stay (HOSPITAL_COMMUNITY)
Admission: EM | Admit: 2017-12-31 | Discharge: 2018-01-02 | DRG: 894 | Discharge status: Against Medical Advice | Payer: Self-pay | Attending: Internal Medicine | Admitting: Internal Medicine

## 2017-12-31 ENCOUNTER — Encounter (HOSPITAL_COMMUNITY): Payer: Self-pay | Admitting: *Deleted

## 2017-12-31 DIAGNOSIS — Z79899 Other long term (current) drug therapy: Secondary | ICD-10-CM

## 2017-12-31 DIAGNOSIS — F10939 Alcohol use, unspecified with withdrawal, unspecified: Secondary | ICD-10-CM | POA: Diagnosis present

## 2017-12-31 DIAGNOSIS — Z8371 Family history of colonic polyps: Secondary | ICD-10-CM

## 2017-12-31 DIAGNOSIS — Z811 Family history of alcohol abuse and dependence: Secondary | ICD-10-CM

## 2017-12-31 DIAGNOSIS — E785 Hyperlipidemia, unspecified: Secondary | ICD-10-CM | POA: Diagnosis present

## 2017-12-31 DIAGNOSIS — I4581 Long QT syndrome: Secondary | ICD-10-CM | POA: Diagnosis present

## 2017-12-31 DIAGNOSIS — N400 Enlarged prostate without lower urinary tract symptoms: Secondary | ICD-10-CM | POA: Diagnosis present

## 2017-12-31 DIAGNOSIS — F329 Major depressive disorder, single episode, unspecified: Secondary | ICD-10-CM | POA: Diagnosis present

## 2017-12-31 DIAGNOSIS — Z720 Tobacco use: Secondary | ICD-10-CM | POA: Diagnosis present

## 2017-12-31 DIAGNOSIS — F17210 Nicotine dependence, cigarettes, uncomplicated: Secondary | ICD-10-CM | POA: Diagnosis present

## 2017-12-31 DIAGNOSIS — F10239 Alcohol dependence with withdrawal, unspecified: Principal | ICD-10-CM

## 2017-12-31 DIAGNOSIS — Z8249 Family history of ischemic heart disease and other diseases of the circulatory system: Secondary | ICD-10-CM

## 2017-12-31 DIAGNOSIS — Z801 Family history of malignant neoplasm of trachea, bronchus and lung: Secondary | ICD-10-CM

## 2017-12-31 DIAGNOSIS — Z803 Family history of malignant neoplasm of breast: Secondary | ICD-10-CM

## 2017-12-31 DIAGNOSIS — I472 Ventricular tachycardia, unspecified: Secondary | ICD-10-CM

## 2017-12-31 DIAGNOSIS — K219 Gastro-esophageal reflux disease without esophagitis: Secondary | ICD-10-CM | POA: Diagnosis present

## 2017-12-31 DIAGNOSIS — I10 Essential (primary) hypertension: Secondary | ICD-10-CM | POA: Diagnosis present

## 2017-12-31 DIAGNOSIS — G8929 Other chronic pain: Secondary | ICD-10-CM | POA: Diagnosis present

## 2017-12-31 DIAGNOSIS — F17200 Nicotine dependence, unspecified, uncomplicated: Secondary | ICD-10-CM | POA: Diagnosis present

## 2017-12-31 DIAGNOSIS — J449 Chronic obstructive pulmonary disease, unspecified: Secondary | ICD-10-CM | POA: Diagnosis present

## 2017-12-31 DIAGNOSIS — M545 Low back pain: Secondary | ICD-10-CM | POA: Diagnosis present

## 2017-12-31 DIAGNOSIS — K589 Irritable bowel syndrome without diarrhea: Secondary | ICD-10-CM | POA: Diagnosis present

## 2017-12-31 DIAGNOSIS — F419 Anxiety disorder, unspecified: Secondary | ICD-10-CM | POA: Diagnosis present

## 2017-12-31 DIAGNOSIS — M5442 Lumbago with sciatica, left side: Secondary | ICD-10-CM

## 2017-12-31 DIAGNOSIS — I4729 Other ventricular tachycardia: Secondary | ICD-10-CM

## 2017-12-31 DIAGNOSIS — M5441 Lumbago with sciatica, right side: Secondary | ICD-10-CM

## 2017-12-31 LAB — CBC
HCT: 48.3 % (ref 39.0–52.0)
HEMOGLOBIN: 16 g/dL (ref 13.0–17.0)
MCH: 28.2 pg (ref 26.0–34.0)
MCHC: 33.1 g/dL (ref 30.0–36.0)
MCV: 85.2 fL (ref 80.0–100.0)
Platelets: 331 10*3/uL (ref 150–400)
RBC: 5.67 MIL/uL (ref 4.22–5.81)
RDW: 12.3 % (ref 11.5–15.5)
WBC: 10.3 10*3/uL (ref 4.0–10.5)
nRBC: 0 % (ref 0.0–0.2)

## 2017-12-31 LAB — COMPREHENSIVE METABOLIC PANEL
ALBUMIN: 3.8 g/dL (ref 3.5–5.0)
ALT: 41 U/L (ref 0–44)
AST: 31 U/L (ref 15–41)
Alkaline Phosphatase: 107 U/L (ref 38–126)
Anion gap: 16 — ABNORMAL HIGH (ref 5–15)
CHLORIDE: 91 mmol/L — AB (ref 98–111)
CO2: 26 mmol/L (ref 22–32)
Calcium: 9.4 mg/dL (ref 8.9–10.3)
Creatinine, Ser: 0.74 mg/dL (ref 0.61–1.24)
GFR calc Af Amer: >60 mL/min (ref >60)
GFR calc non Af Amer: >60 mL/min (ref >60)
GLUCOSE: 146 mg/dL — AB (ref 70–99)
Potassium: 3.2 mmol/L — ABNORMAL LOW (ref 3.5–5.1)
Sodium: 133 mmol/L — ABNORMAL LOW (ref 135–145)
Total Bilirubin: 0.5 mg/dL (ref 0.3–1.2)
Total Protein: 6.5 g/dL (ref 6.5–8.1)

## 2017-12-31 LAB — RAPID URINE DRUG SCREEN, HOSP PERFORMED
Amphetamines: NOT DETECTED
Barbiturates: NOT DETECTED
Benzodiazepines: NOT DETECTED
COCAINE: NOT DETECTED
OPIATES: NOT DETECTED
Tetrahydrocannabinol: NOT DETECTED

## 2017-12-31 LAB — MRSA PCR SCREENING: MRSA by PCR: NEGATIVE

## 2017-12-31 LAB — TROPONIN I: Troponin I: <0.03 ng/mL (ref <0.03)

## 2017-12-31 LAB — ETHANOL: ALCOHOL ETHYL (B): 92 mg/dL — AB (ref <10)

## 2017-12-31 LAB — PHOSPHORUS: Phosphorus: 3.1 mg/dL (ref 2.5–4.6)

## 2017-12-31 LAB — CK: Total CK: 63 U/L (ref 49–397)

## 2017-12-31 LAB — MAGNESIUM: Magnesium: 2.3 mg/dL (ref 1.7–2.4)

## 2017-12-31 MED ORDER — LORAZEPAM 2 MG/ML IJ SOLN
2.0000 mg | Freq: Once | INTRAMUSCULAR | Status: AC
  Administered 2017-12-31: 2 mg via INTRAVENOUS
  Filled 2017-12-31: qty 1

## 2017-12-31 MED ORDER — THIAMINE HCL 100 MG/ML IJ SOLN
100.0000 mg | Freq: Every day | INTRAMUSCULAR | Status: DC
  Administered 2017-12-31: 100 mg via INTRAVENOUS
  Filled 2017-12-31: qty 2

## 2017-12-31 MED ORDER — NICOTINE 21 MG/24HR TD PT24
21.0000 mg | MEDICATED_PATCH | Freq: Once | TRANSDERMAL | Status: AC
  Administered 2017-12-31: 21 mg via TRANSDERMAL
  Filled 2017-12-31: qty 1

## 2017-12-31 MED ORDER — OXYCODONE-ACETAMINOPHEN 5-325 MG PO TABS
2.0000 | ORAL_TABLET | Freq: Once | ORAL | Status: AC
  Administered 2017-12-31: 2 via ORAL
  Filled 2017-12-31: qty 2

## 2017-12-31 MED ORDER — POTASSIUM CHLORIDE CRYS ER 20 MEQ PO TBCR
40.0000 meq | EXTENDED_RELEASE_TABLET | Freq: Once | ORAL | Status: AC
  Administered 2017-12-31: 40 meq via ORAL
  Filled 2017-12-31: qty 2

## 2017-12-31 MED ORDER — LORAZEPAM 2 MG/ML IJ SOLN: 0.0000 mg | Freq: Two times a day (BID) | INTRAMUSCULAR | Status: DC

## 2017-12-31 MED ORDER — METOPROLOL TARTRATE 50 MG PO TABS
50.0000 mg | ORAL_TABLET | Freq: Three times a day (TID) | ORAL | Status: DC
  Administered 2017-12-31 – 2018-01-02 (×5): 50 mg via ORAL
  Filled 2017-12-31 (×5): qty 1

## 2017-12-31 MED ORDER — LORAZEPAM 1 MG PO TABS: 0.0000 mg | ORAL_TABLET | Freq: Two times a day (BID) | ORAL | Status: DC

## 2017-12-31 MED ORDER — FENTANYL CITRATE (PF) 100 MCG/2ML IJ SOLN
50.0000 ug | Freq: Once | INTRAMUSCULAR | Status: AC
  Administered 2017-12-31: 50 ug via INTRAVENOUS
  Filled 2017-12-31: qty 2

## 2017-12-31 MED ORDER — POTASSIUM CHLORIDE IN NACL 40-0.9 MEQ/L-% IV SOLN
INTRAVENOUS | Status: AC
  Administered 2017-12-31: 125 mL/h via INTRAVENOUS
  Filled 2017-12-31: qty 1000

## 2017-12-31 MED ORDER — VITAMIN B-1 100 MG PO TABS
100.0000 mg | ORAL_TABLET | Freq: Every day | ORAL | Status: DC
  Administered 2018-01-01 – 2018-01-02 (×2): 100 mg via ORAL
  Filled 2017-12-31 (×2): qty 1

## 2017-12-31 MED ORDER — GABAPENTIN 600 MG PO TABS
600.0000 mg | ORAL_TABLET | Freq: Three times a day (TID) | ORAL | Status: DC
  Administered 2017-12-31 – 2018-01-01 (×3): 600 mg via ORAL
  Filled 2017-12-31 (×3): qty 1

## 2017-12-31 MED ORDER — LORAZEPAM 2 MG/ML IJ SOLN
0.0000 mg | Freq: Four times a day (QID) | INTRAMUSCULAR | Status: DC
  Administered 2017-12-31 – 2018-01-02 (×6): 2 mg via INTRAVENOUS
  Filled 2017-12-31 (×5): qty 1

## 2017-12-31 MED ORDER — PANTOPRAZOLE SODIUM 40 MG PO TBEC
40.0000 mg | DELAYED_RELEASE_TABLET | Freq: Two times a day (BID) | ORAL | Status: DC
  Administered 2017-12-31 – 2018-01-02 (×4): 40 mg via ORAL
  Filled 2017-12-31 (×4): qty 1

## 2017-12-31 MED ORDER — MAGNESIUM SULFATE 2 GM/50ML IV SOLN
2.0000 g | Freq: Once | INTRAVENOUS | Status: AC
  Administered 2017-12-31: 2 g via INTRAVENOUS
  Filled 2017-12-31: qty 50

## 2017-12-31 MED ORDER — LORAZEPAM 2 MG/ML IJ SOLN
2.0000 mg | INTRAMUSCULAR | Status: DC | PRN
  Administered 2017-12-31 – 2018-01-01 (×4): 2 mg via INTRAVENOUS
  Administered 2018-01-01 – 2018-01-02 (×2): 3 mg via INTRAVENOUS
  Filled 2017-12-31 (×2): qty 2
  Filled 2017-12-31 (×5): qty 1

## 2017-12-31 MED ORDER — NICOTINE 21 MG/24HR TD PT24
21.0000 mg | MEDICATED_PATCH | Freq: Every day | TRANSDERMAL | Status: DC
  Administered 2018-01-01 – 2018-01-02 (×2): 21 mg via TRANSDERMAL
  Filled 2017-12-31 (×2): qty 1

## 2017-12-31 MED ORDER — PROCHLORPERAZINE EDISYLATE 10 MG/2ML IJ SOLN
10.0000 mg | Freq: Four times a day (QID) | INTRAMUSCULAR | Status: DC | PRN
  Administered 2018-01-01: 10 mg via INTRAVENOUS
  Filled 2017-12-31: qty 2

## 2017-12-31 MED ORDER — LORAZEPAM 1 MG PO TABS: 0.0000 mg | ORAL_TABLET | Freq: Four times a day (QID) | ORAL | Status: DC

## 2017-12-31 MED ORDER — POTASSIUM CHLORIDE 10 MEQ/100ML IV SOLN
10.0000 meq | INTRAVENOUS | Status: AC
  Administered 2017-12-31: 10 meq via INTRAVENOUS
  Filled 2017-12-31: qty 100

## 2017-12-31 NOTE — ED Notes (Signed)
Admitting at bedside 

## 2017-12-31 NOTE — ED Notes (Signed)
While waiting on lunch tray, provided patient with [employer] and graham crackers and peanut butter

## 2017-12-31 NOTE — ED Triage Notes (Signed)
Pt in c/o inappropriate movement in his extremities, pt was trying to decreased his ETOH amount, normally drinks 24 beers per day which he last consumed on Sunday, since Sunday has only had 12 beers per day, during the night he woke up with the twitching and jerking movements to his extremities, consumed 9 beers PTA trying to stop the movement but was unable to, pt unable to sit still during triage, history of seizures and intubation during ETOH withdrawal in the past

## 2017-12-31 NOTE — ED Notes (Signed)
Called to follow-up on lunch tray.  States ticket is printed and "we're working on it".  Continue to apologize to patient for delays, offered [place] sandwich, willing to wait

## 2017-12-31 NOTE — ED Provider Notes (Signed)
[facility] Provider Note   CSN: [id number] Arrival date & time: 12/31/17  1034     History   Chief Complaint Chief Complaint  Patient presents with  . Withdrawal    HPI Mark [name] is a 52 y.o. male with history of alcohol abuse, COPD, hypertension, IBS, withdrawal seizures who presents with reported episodes of muscle contractions.  Per family member, patient was seen having a seizure-like activity.  Patient has been drinking a little less than normal as he is been trying to cut back, however he normally drinks 12-24 beers daily.  He had been drinking a whole lot more the past couple weeks, but over the past few days he has drank a little less.  He reports he does not feel like he is in the worst withdrawal he has had in the past.  He has had delirium tremens before as well as withdrawal seizures.  He is not hallucinating.  He denies any other drug use.  He reports around 530 he began with episodes of muscle contractions were his whole body felt like it was tightening.  His muscles are very sore now.  He also has a headache, some dizziness and lightheadedness as well as nausea.  He denies any chest pain or shortness of breath, but did say that he had chest pain last week.  He reports abdominal soreness and feeling like he got punched in the stomach from the muscle soreness.  HPI  Past Medical History:  Diagnosis Date  . Alcohol withdrawal ([place])   . Allergy   . Anxiety Dx 2014  . COPD (chronic obstructive pulmonary disease) ([place])   . Drug abuse ([place])    pt reports opioid dependence due to previous back surgeries  . Enlarged prostate   . ETOH abuse   . GERD (gastroesophageal reflux disease) Dx 2003  . Headache(784.0)   . Hepatitis   . Hyperlipidemia Dx 2000  . Hypertension Dx 2011  . IBS (irritable bowel syndrome)    alternates constiaption/ diarrhea  . Irregular heart beat   . 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 Mental disorder   .  Neuromuscular disorder ([place])    hands hurt   . Neuropathy 06/07/2013  . Seizure due to alcohol withdrawal ([place]) 01/23/2014   Per patient report  . Shortness of breath   . Withdrawal seizures ([place]) 2014   last seizure 2014 etoh with drawal    Patient Active Problem List   Diagnosis Date Noted  . Alcohol withdrawal ([place]) 12/31/2017  . Pain management contract broken 12/15/2017  . Dyspepsia   . Opioid-induced hyperalgesia (with oxycodone) 02/11/2017  . Allodynia (Lower extremities) 12/26/2016  . Hyperalgesia (Lower extremities) 12/26/2016  . Sympathetic pain (lower extremity) 09/26/2016  . Lower extremity neuropathy (Primary Source of Pain) (Bilateral) (R>L) 09/26/2016  . Lumbar facet syndrome (Bilateral) (R>L) 08/22/2016  . Chronic sacroiliac joint pain (Bilateral) (R>L) 08/22/2016  . Chronic lower extremity pain (Bilateral) (R>L) 06/26/2016  . Opiate use 05/14/2016  . Occipital headache (Bilateral) (R>L) 04/16/2016  . Musculoskeletal pain 04/15/2016  . 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 Major depressive disorder, recurrent episode, moderate ([place]) 04/10/2016  . Long term current use of opiate analgesic 03/18/2016  . Long term prescription opiate use 03/18/2016  . Chronic neck pain (Bilateral)(R>L) 03/18/2016  . Cervical fusion syndrome 03/18/2016  . 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 Chronic knee pain  (Bilateral) (R>L) 03/18/2016  . GAD (generalized anxiety disorder) 12/06/2015  .  Alcohol use disorder, severe, in early remission ([place]) 12/06/2015  . GERD (gastroesophageal reflux disease) 07/24/2015  . Chronic anxiety 12/19/2014  . Erectile dysfunction 09/16/2014  . Poor dentition 07/28/2014  . Chronic maxillary sinusitis 07/28/2014  . Vitamin D insufficiency 07/05/2014  . Chronic fatigue 07/04/2014  . Chronic radicular low back pain [facility] source of pain) (Right) (to calf) 06/02/2014  . Chronic shoulder pain (Right) 05/12/2014  . Alcohol use  disorder, severe, dependence ([place])   . 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 Hypokalemia   . Opioid dependence ([place]) 01/22/2014    Class: Acute  . Paroxysmal VT ([place]) 12/19/2013  . Chronic low back pain (Secondary source of pain) (Bilateral) (R>L) 12/28/2012  . Chronic pain syndrome 12/28/2012  . Diarrhea 12/28/2012  . Alcohol intoxication ([place]) 06/09/2012  . 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 Nonsustained ventricular tachycardia ([place]) 01/20/2012  . Tobacco abuse 01/19/2012  . Essential hypertension   . Hyperlipidemia     Past Surgical History:  Procedure Laterality Date  . [place] STUDY N/A 01/16/2015   Procedure: [place] STUDY;  Surgeon: Manus Gunning, MD;  Location: [facility]OS[facility];  Service: Gastroenterology;  Laterality: N/A;  . [place]  . COLONOSCOPY  1998  . LEFT HEART CATHETERIZATION WITH CORONARY ANGIOGRAM N/A 01/13/2014   Procedure: LEFT HEART CATHETERIZATION WITH CORONARY ANGIOGRAM;  Surgeon: Clent Demark, MD;  Location: [facility] CATH LAB;  Service: Cardiovascular;  Laterality: N/A;  . NASAL SINUS SURGERY    . NECK SURGERY  2000   cervial fusion   . NISSEN FUNDOPLICATION    . SHOULDER SURGERY Right   . SUBACROMIAL DECOMPRESSION Right 11/16/2014   Procedure: RIGHT SHOULDER ARTHROSCOPY WITH SUBACROMIAL DECOMPRESSION ;  Surgeon: Leandrew Koyanagi, MD;  Location: [facility];  Service: Orthopedics;  Laterality: Right;  . UPPER GASTROINTESTINAL ENDOSCOPY          Home Medications    Prior to Admission medications   Medication Sig Start Date End Date Taking? Authorizing Provider  albuterol (VENTOLIN HFA) 108 (90 Base) MCG/ACT inhaler Inhale 2 puffs into the lungs every 6 (six) hours as needed for wheezing or shortness of breath. 08/12/17  Yes Ladell Pier, MD  cyclobenzaprine (FLEXERIL) 10 MG tablet Take 1 tablet (10 mg total) by mouth at bedtime. Patient taking differently: Take 10 mg by mouth at bedtime as  needed for muscle spasms.  09/18/17 12/31/17 Yes Vevelyn Francois, NP  famotidine (PEPCID AC) 10 MG chewable tablet Chew 10 mg by mouth as needed for heartburn.   Yes [provider]  gabapentin (NEURONTIN) 600 MG tablet Take 1 tablet (600 mg total) by mouth 3 (three) times daily. 12/23/17  Yes Ladell Pier, MD  metoprolol (LOPRESSOR) 50 MG tablet Take 1 tablet (50 mg total) by mouth 2 (two) times daily. Patient taking differently: Take 50 mg by mouth 3 (three) times daily.  07/24/15  Yes Funches, Josalyn, MD  nicotine (NICODERM CQ - DOSED IN MG/24 HOURS) 21 mg/24hr patch Place 1 patch (21 mg total) onto the skin daily. 12/23/17  Yes Ladell Pier, MD  omeprazole (PRILOSEC OTC) 20 MG tablet Take 20 mg by mouth 2 (two) times daily.   Yes [provider]  promethazine (PHENERGAN) 12.5 MG tablet Take 1 tablet (12.5 mg total) by mouth daily as needed for nausea or vomiting. Each prescription to last a mth Patient taking differently: Take 12.5 mg by mouth daily as  needed for nausea or vomiting.  12/23/17  Yes Ladell Pier, MD  traZODone (DESYREL) 100 MG tablet Take 1 tablet (100 mg total) by mouth at bedtime as needed. Patient taking differently: Take 50-100 mg by mouth at bedtime as needed for sleep.  08/21/17  Yes Ladell Pier, MD  chlordiazePOXIDE (LIBRIUM) 25 MG capsule 50mg  PO TID x 1D, then 25-50mg  PO BID X 1D, then 25-50mg  PO QD X 1D Patient not taking: Reported on 12/31/2017 12/29/17   Virgel Manifold, MD  CIALIS 5 MG tablet TAKE 1 TABLET BY MOUTH DAILY. Patient not taking: Reported on 12/29/2017 11/14/17   Ladell Pier, MD  doxycycline (VIBRAMYCIN) 100 MG capsule Take 1 capsule (100 mg total) by mouth 2 (two) times daily. One po bid x 7 days Patient not taking: Reported on 12/31/2017 12/21/17   Milton Ferguson, MD  HYDROcodone-acetaminophen (NORCO/VICODIN) 5-325 MG tablet Take 1 tablet by mouth every 6 (six) hours as needed for moderate pain. Patient not  taking: Reported on 12/31/2017 12/21/17   Milton Ferguson, MD  nicotine (NICODERM CQ - DOSED IN MG/24 HOURS) 14 mg/24hr patch Place 1 patch (14 mg total) onto the skin daily. 12/23/17   Ladell Pier, MD  traMADol (ULTRAM) 50 MG tablet Take 2 tablets (100 mg total) by mouth 4 (four) times daily. 1-2 tablets Patient not taking: Reported on 12/31/2017 09/23/17 12/31/17  Vevelyn Francois, NP    Family History Family History  Problem Relation Age of Onset  . Lung cancer Father        was a smoker  . Alcohol abuse Father   . Hypertension Mother   . Colon polyps Mother   . Breast cancer Paternal Grandmother   . Alcohol abuse Paternal Grandfather   . Colon cancer Neg Hx   . Rectal cancer Neg Hx   . Stomach cancer Neg Hx     Social History Social History   Tobacco Use  . Smoking status: Current Every Day Smoker    Packs/day: 0.50    Years: 30.00    Pack years: 15.00    Types: Cigarettes  . Smokeless tobacco: Never Used  Substance Use Topics  . Alcohol use: Yes    Alcohol/week: 0.0 standard drinks    Comment: Case a beer a day   . Drug use: No    Comment: Opana     Allergies   Cozaar [losartan] and Lisinopril   Review of Systems Review of Systems  Constitutional: Negative for chills and fever.  HENT: Negative for facial swelling and sore throat.   Respiratory: Negative for shortness of breath.   Cardiovascular: Negative for chest pain.  Gastrointestinal: Positive for abdominal pain and nausea. Negative for vomiting.  Genitourinary: Negative for dysuria.  Musculoskeletal: Positive for back pain (chronic).  Skin: Negative for rash and wound.  Neurological: Positive for dizziness, tremors, seizures, light-headedness, numbness (chronic neuropathy) and headaches.  Psychiatric/Behavioral: The patient is not nervous/anxious.      Physical Exam Updated Vital Signs BP (!) 147/83   Pulse 100   Temp 98.3 F (36.8 C) (Oral)   Resp (!) 28   SpO2 100%   Physical Exam    Constitutional: He appears well-developed and well-nourished. No distress.  HENT:  Head: Normocephalic and atraumatic.  Mouth/Throat: Oropharynx is clear and moist. No oropharyngeal exudate.  Eyes: Pupils are equal, round, and reactive to light. Conjunctivae and EOM are normal. Right eye exhibits no discharge. Left eye exhibits no discharge. No scleral icterus.  Neck: Normal range of motion. Neck supple. No thyromegaly present.  Cardiovascular: Normal rate, regular rhythm, normal heart sounds and intact distal pulses. Exam reveals no gallop and no friction rub.  No murmur heard. Pulmonary/Chest: Effort normal and breath sounds normal. No stridor. No respiratory distress. He has no wheezes. He has no rales.  Abdominal: Soft. Bowel sounds are normal. He exhibits no distension. There is no tenderness. There is no rebound and no guarding.  Musculoskeletal: He exhibits no edema.  Lymphadenopathy:    He has no cervical adenopathy.  Neurological: He is alert. Coordination normal.  CN 3-12 intact; decreased sensation in bilateral feel, but otherwise normal sensation throughout; 5/5 strength in all 4 extremities; equal bilateral grip strength; tremor with outstretched arms  Skin: Skin is warm and dry. No rash noted. He is not diaphoretic. No pallor.  Psychiatric: He has a normal mood and affect.  Nursing note and vitals reviewed.    ED Treatments / Results  Labs (all labs ordered are listed, but only abnormal results are displayed) Labs Reviewed  COMPREHENSIVE METABOLIC PANEL - Abnormal; Notable for the following components:      Result Value   Sodium 133 (*)    Potassium 3.2 (*)    Chloride 91 (*)    Glucose, Bld 146 (*)    BUN <5 (*)    Anion gap 16 (*)    All other components within normal limits  ETHANOL - Abnormal; Notable for the following components:   Alcohol, Ethyl (B) 92 (*)    All other components within normal limits  CBC  RAPID URINE DRUG SCREEN, HOSP PERFORMED  MAGNESIUM   CK  TROPONIN I  URINALYSIS, ROUTINE W REFLEX MICROSCOPIC  PHOSPHORUS    EKG EKG Interpretation  Date/Time:  Wednesday December 31 2017 12:50:19 EDT Ventricular Rate:  73 PR Interval:    QRS Duration: 109 QT Interval:  447 QTC Calculation: 493 R Axis:   59 Text Interpretation:  Sinus rhythm Borderline T abnormalities, anterior leads Borderline prolonged QT interval When compared to prior, less PVC but longer Qtc.  No STEMI Confirmed by Antony Blackbird [phone]) on 12/31/2017 1:17:13 PM   Radiology Ct Cervical Spine Wo Contrast  Result Date: 12/29/2017 CLINICAL DATA:  Fall with neck pain EXAM: CT CERVICAL SPINE WITHOUT CONTRAST TECHNIQUE: Multidetector CT imaging of the cervical spine was performed without intravenous contrast. Multiplanar CT image reconstructions were also generated. COMPARISON:  03/18/2016 FINDINGS: Alignment: Straightening of the cervical spine. Facet alignment within normal limits. Skull base and vertebrae: No acute fracture. No primary bone lesion or focal pathologic process. Soft tissues and spinal canal: No prevertebral fluid or swelling. No visible canal hematoma. Disc levels: Anterior plate and screw fixation C5 through C7 with screw fracture bilaterally at C5. Mild degenerative changes C4-C5 and C7-T1 Upper chest: Apical emphysema Other: None IMPRESSION: 1. Postsurgical changes C5 through C7 with screw fracture bilaterally T5, noted on prior radiograph from 2018 2. No definite acute osseous abnormality. 3. Apical emphysema Electronically Signed   By: Donavan Foil M.D.   On: 12/29/2017 18:01    Procedures Procedures (including critical care time)  Medications Ordered in ED Medications  LORazepam (ATIVAN) injection 0-4 mg (2 mg Intravenous Given 12/31/17 1251)    Or  LORazepam (ATIVAN) tablet 0-4 mg ( Oral See Alternative 12/31/17 1251)  LORazepam (ATIVAN) injection 0-4 mg (has no administration in time range)    Or  LORazepam (ATIVAN) tablet 0-4 mg (has no  administration in time range)  thiamine (VITAMIN B-1) tablet 100 mg ( Oral See Alternative 12/31/17 1251)    Or  thiamine (B-1) injection 100 mg (100 mg Intravenous Given 12/31/17 1251)  nicotine (NICODERM CQ - dosed in mg/24 hours) patch 21 mg (21 mg Transdermal Patch Applied 12/31/17 1422)  0.9 % NaCl with KCl 40 mEq / L  infusion (has no administration in time range)  magnesium sulfate IVPB 2 g 50 mL (has no administration in time range)  fentaNYL (SUBLIMAZE) injection 50 mcg (50 mcg Intravenous Given 12/31/17 1420)     Initial Impression / Assessment and Plan / ED Course  I have reviewed the triage vital signs and the nursing notes.  Pertinent labs & imaging results that were available during my care of the patient were reviewed by me and considered in my medical decision making (see chart for details).     Patient presenting with seizure-like activity in the setting of decreased alcohol intake.  Suspect alcohol withdrawal.  Patient has had alcohol withdrawal seizures and DTs in the past.  Labs are stable with mild hypokalemia, 3.2.  Patient also with chronic back pain.  Patient is neurovascularly intact today other than peripheral neuropathy.  Low suspicion of cauda equina, although patient did just receive lumbar injection.  Patient is ambulatory and reflexes are 2+.  Patient states he was initially 18 and reduced to 9 after Ativan.  Considering reported seizure-like activity in the setting of alcohol withdrawal, will admit to stepdown unit.  I spoke with Dr. Olevia Bowens with [facility] who accepts the patient.  I appreciate his assistance with the patient.  Patient also evaluated by my attending, Dr. Sherry Ruffing, who guided the patient's management and agrees with plan.  Final Clinical Impressions(s) / ED Diagnoses   Final diagnoses:  Alcohol withdrawal syndrome with complication [facility])    ED Discharge Orders    None       Frederica Kuster, PA-C 12/31/17 1541    Tegeler, Gwenyth Allegra,  MD 12/31/17 [id number]

## 2017-12-31 NOTE — ED Notes (Signed)
Patient continues to request pain medications.  States he told the admitting doctor, and admitting states he would put in.  Will wait for additional orders.

## 2017-12-31 NOTE — H&P (Signed)
History and Physical    [name]:[id number] DOB: [birth year] DOA: 12/31/2017  PCP: Ladell Pier, MD   Patient coming from: Home.  I have personally briefly reviewed patient's old medical records in [place]  Chief Complaint: Alcohol withdrawal.  HPI: Mark [name] is a 52 y.o. male with medical history significant of allergy, anxiety, depression, COPD, history of prescription opiate abuse, BPH, GERD, history of Nielsen fundoplication, headache, hepatitis, hyperlipidemia, hypertension, IBS, history of nonsustained V. tach, history of long QT syndrome, hands peripheral neuropathy, alcohol dependence currently drinking about 24 beers a day with history of withdrawal seizures, detox admission, intubated in the past as well due to alcohol withdrawal who returns for the second time in the last 3 days wanting to detox from alcohol.  He states that he tried to do it at home and has been only drinking 12 beers per day since Sunday.  Yesterday, he consumed 9 beers, but started jerking with his extremities uncontrollably while he was outside smoking a cigarette.  He states that his brother and father helped him to sit down and called EMS.  The nursing staff in the triage area describes the patient has trying to lose top his involuntary movements and unable to sit down.  He is currently awake, alert, and oriented x3.  He complains of body aches, mild generalized abdominal pain, mild headache, mild nausea and mild dizziness.  He denies fever, chills, sore throat, rhinorrhea, but feels fatigue.  Denies wheezing, hemoptysis, dyspnea.  No chest pain, palpitations, diaphoresis, PND, orthopnea or pitting edema of the lower extremities.  He denies emesis, diarrhea, constipation, melena or hematochezia.  No dysuria, frequency or hematuria.  ED Course: Initial vital signs temperature 98.3 F, pulse 101, respirations 20, blood pressure 160 mmHg and O2 sat 95% on room air.  The patient has been  given at least 3 mg of lorazepam and IVP so far.  I ordered 2 extra milligrams of lorazepam, magnesium sulfate and K. Dur 40 mEq p.o. X1.  His CBC was normal.  UDS was normal.  Alcohol was 92, glucose 146, BUN less than 5, creatinine 0.74, calcium 9.4 magnesium 2.3 mg/dL.  Sodium 133, potassium 3.2, chloride 91 and CO2 26 mmol/L with an anion gap of 16.  [name] were normal.  Review of Systems: As per HPI otherwise 10 point review of systems negative.  Past Medical History:  Diagnosis Date  . Alcohol withdrawal ([place])   . Allergy   . Anxiety Dx 2014  . COPD (chronic obstructive pulmonary disease) ([place])   . Drug abuse ([place])    pt reports opioid dependence due to previous back surgeries  . Enlarged prostate   . ETOH abuse   . GERD (gastroesophageal reflux disease) Dx 2003  . Headache(784.0)   . Hepatitis   . Hyperlipidemia Dx 2000  . Hypertension Dx 2011  . IBS (irritable bowel syndrome)    alternates constiaption/ diarrhea  . Irregular heart beat   . 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 Mental disorder   . Neuromuscular disorder ([place])    hands hurt   . Neuropathy 06/07/2013  . Seizure due to alcohol withdrawal ([place]) 01/23/2014   Per patient report  . Shortness of breath   . Withdrawal seizures ([place]) 2014   last seizure 2014 etoh with drawal    Past Surgical History:  Procedure Laterality Date  . [place] STUDY N/A 01/16/2015   Procedure: [place] STUDY;  Surgeon: Renelda Loma  Havery Moros, MD;  Location: [facility];  Service: Gastroenterology;  Laterality: N/A;  . [place]  . COLONOSCOPY  1998  . LEFT HEART CATHETERIZATION WITH CORONARY ANGIOGRAM N/A 01/13/2014   Procedure: LEFT HEART CATHETERIZATION WITH CORONARY ANGIOGRAM;  Surgeon: Clent Demark, MD;  Location: [facility];  Service: Cardiovascular;  Laterality: N/A;  . NASAL SINUS SURGERY    . NECK SURGERY  2000   cervial fusion   . NISSEN FUNDOPLICATION    . SHOULDER SURGERY Right   . SUBACROMIAL  DECOMPRESSION Right 11/16/2014   Procedure: RIGHT SHOULDER ARTHROSCOPY WITH SUBACROMIAL DECOMPRESSION ;  Surgeon: Leandrew Koyanagi, MD;  Location: [facility];  Service: Orthopedics;  Laterality: Right;  . UPPER GASTROINTESTINAL ENDOSCOPY       reports that he has been smoking cigarettes. He has a 15.00 pack-year smoking history. He has never used smokeless tobacco. He reports that he drinks alcohol. He reports that he does not use drugs.  Allergies  Allergen Reactions  . Cozaar [Losartan] Anaphylaxis  . Lisinopril Anaphylaxis    Family History  Problem Relation Age of Onset  . Lung cancer Father        was a smoker  . Alcohol abuse Father   . Hypertension Mother   . Colon polyps Mother   . Breast cancer Paternal Grandmother   . Alcohol abuse Paternal Grandfather   . Colon cancer Neg Hx   . Rectal cancer Neg Hx   . Stomach cancer Neg Hx    Prior to Admission medications   Medication Sig Start Date End Date Taking? Authorizing Provider  albuterol (VENTOLIN HFA) 108 (90 Base) MCG/ACT inhaler Inhale 2 puffs into the lungs every 6 (six) hours as needed for wheezing or shortness of breath. 08/12/17  Yes Ladell Pier, MD  cyclobenzaprine (FLEXERIL) 10 MG tablet Take 1 tablet (10 mg total) by mouth at bedtime. Patient taking differently: Take 10 mg by mouth at bedtime as needed for muscle spasms.  09/18/17 12/31/17 Yes Vevelyn Francois, NP  famotidine (PEPCID AC) 10 MG chewable tablet Chew 10 mg by mouth as needed for heartburn.   Yes [provider]  gabapentin (NEURONTIN) 600 MG tablet Take 1 tablet (600 mg total) by mouth 3 (three) times daily. 12/23/17  Yes Ladell Pier, MD  metoprolol (LOPRESSOR) 50 MG tablet Take 1 tablet (50 mg total) by mouth 2 (two) times daily. Patient taking differently: Take 50 mg by mouth 3 (three) times daily.  07/24/15  Yes Funches, Josalyn, MD  nicotine (NICODERM CQ - DOSED IN MG/24 HOURS) 21 mg/24hr patch Place 1 patch (21 mg  total) onto the skin daily. 12/23/17  Yes Ladell Pier, MD  omeprazole (PRILOSEC OTC) 20 MG tablet Take 20 mg by mouth 2 (two) times daily.   Yes [provider]  promethazine (PHENERGAN) 12.5 MG tablet Take 1 tablet (12.5 mg total) by mouth daily as needed for nausea or vomiting. Each prescription to last a mth Patient taking differently: Take 12.5 mg by mouth daily as needed for nausea or vomiting.  12/23/17  Yes Ladell Pier, MD  traZODone (DESYREL) 100 MG tablet Take 1 tablet (100 mg total) by mouth at bedtime as needed. Patient taking differently: Take 50-100 mg by mouth at bedtime as needed for sleep.  08/21/17  Yes Ladell Pier, MD  chlordiazePOXIDE (LIBRIUM) 25 MG capsule 50mg  PO TID x 1D, then 25-50mg  PO BID X 1D, then 25-50mg  PO QD  X 1D Patient not taking: Reported on 12/31/2017 12/29/17   Virgel Manifold, MD  CIALIS 5 MG tablet TAKE 1 TABLET BY MOUTH DAILY. Patient not taking: Reported on 12/29/2017 11/14/17   Ladell Pier, MD  doxycycline (VIBRAMYCIN) 100 MG capsule Take 1 capsule (100 mg total) by mouth 2 (two) times daily. One po bid x 7 days Patient not taking: Reported on 12/31/2017 12/21/17   Milton Ferguson, MD  HYDROcodone-acetaminophen (NORCO/VICODIN) 5-325 MG tablet Take 1 tablet by mouth every 6 (six) hours as needed for moderate pain. Patient not taking: Reported on 12/31/2017 12/21/17   Milton Ferguson, MD  nicotine (NICODERM CQ - DOSED IN MG/24 HOURS) 14 mg/24hr patch Place 1 patch (14 mg total) onto the skin daily. 12/23/17   Ladell Pier, MD  traMADol (ULTRAM) 50 MG tablet Take 2 tablets (100 mg total) by mouth 4 (four) times daily. 1-2 tablets Patient not taking: Reported on 12/31/2017 09/23/17 12/31/17  Vevelyn Francois, NP    Physical Exam: Vitals:   12/31/17 1230 12/31/17 1315 12/31/17 1400 12/31/17 1500  BP: (!) 149/93 (!) 157/98 134/80 (!) 147/83  Pulse: 72 91 99 100  Resp: 15 16 (!) 22 (!) 28  Temp:      TempSrc:      SpO2:  99% 100% 100% 100%    Constitutional: NAD, calm, comfortable Eyes: PERRL, lids and conjunctivae are mildly injected. ENMT: Mucous membranes are moist. Posterior pharynx clear of any exudate or lesions. Moderate to poor state of repair of dentition.  Positive tongue fasciculations. Neck: normal, supple, no masses, no thyromegaly Respiratory: clear to auscultation bilaterally, subtle bilateral wheezing, but good air movement, no crackles. Normal respiratory effort. No accessory muscle use.  Cardiovascular: Tachycardic at 104 bpm, no murmurs / rubs / gallops. No extremity edema. 2+ pedal pulses. No carotid bruits.  Abdomen: Soft, no tenderness, no masses palpated. No hepatosplenomegaly. Bowel sounds positive.  Musculoskeletal: no clubbing / cyanosis. No joint deformity upper and lower extremities. Good ROM, no contractures. Normal muscle tone.  Skin: no rashes, lesions, ulcers. No induration on very limited dermatological examination. Neurologic: Positive basal tremor.  CN 2-12 grossly intact. Sensation intact, DTR normal. Strength 5/5 in all 4.  Gait examination was deferred. Psychiatric: Normal judgment and insight. Alert and oriented x 3. Normal mood.   Labs on Admission: I ha` ve personally reviewed following labs and imaging studies  CBC: Recent Labs  Lab 12/31/17 1056  WBC 10.3  HGB 16.0  HCT 48.3  MCV 85.2  PLT 194   Basic Metabolic Panel: Recent Labs  Lab 12/31/17 1056  NA 133*  K 3.2*  CL 91*  CO2 26  GLUCOSE 146*  BUN <5*  CREATININE 0.74  CALCIUM 9.4  MG 2.3   GFR: Estimated Creatinine Clearance: 132.6 mL/min (by C-G formula based on SCr of 0.74 mg/dL). Liver Function Tests: Recent Labs  Lab 12/31/17 1056  AST 31  ALT 41  ALKPHOS 107  BILITOT 0.5  PROT 6.5  ALBUMIN 3.8   No results for input(s): LIPASE, AMYLASE in the last 168 hours. No results for input(s): AMMONIA in the last 168 hours. Coagulation Profile: No results for input(s): INR, PROTIME in  the last 168 hours. Cardiac Enzymes: No results for input(s): CKTOTAL, CKMB, CKMBINDEX, TROPONINI in the last 168 hours. BNP (last 3 results) No results for input(s): PROBNP in the last 8760 hours. HbA1C: No results for input(s): HGBA1C in the last 72 hours. CBG: No results for input(s): GLUCAP in  the last 168 hours. Lipid Profile: No results for input(s): CHOL, HDL, LDLCALC, TRIG, CHOLHDL, LDLDIRECT in the last 72 hours. Thyroid Function Tests: No results for input(s): TSH, T4TOTAL, FREET4, T3FREE, THYROIDAB in the last 72 hours. Anemia Panel: No results for input(s): VITAMINB12, FOLATE, FERRITIN, TIBC, IRON, RETICCTPCT in the last 72 hours. Urine analysis:    Component Value Date/Time   COLORURINE STRAW (A) 05/15/2016 2123   APPEARANCEUR Clear 04/09/2017 1022   LABSPEC 1.005 05/15/2016 2123   PHURINE 6.0 05/15/2016 2123   GLUCOSEU Negative 04/09/2017 1022   HGBUR NEGATIVE 05/15/2016 2123   BILIRUBINUR Negative 04/09/2017 1022   KETONESUR NEGATIVE 05/15/2016 2123   PROTEINUR Negative 04/09/2017 1022   PROTEINUR NEGATIVE 05/15/2016 2123   UROBILINOGEN 0.2 09/16/2014 1708   UROBILINOGEN 1.0 01/20/2013 1900   NITRITE Negative 04/09/2017 1022   NITRITE NEGATIVE 05/15/2016 2123   LEUKOCYTESUR Negative 04/09/2017 1022    Radiological Exams on Admission: Ct Cervical Spine Wo Contrast  Result Date: 12/29/2017 CLINICAL DATA:  Fall with neck pain EXAM: CT CERVICAL SPINE WITHOUT CONTRAST TECHNIQUE: Multidetector CT imaging of the cervical spine was performed without intravenous contrast. Multiplanar CT image reconstructions were also generated. COMPARISON:  03/18/2016 FINDINGS: Alignment: Straightening of the cervical spine. Facet alignment within normal limits. Skull base and vertebrae: No acute fracture. No primary bone lesion or focal pathologic process. Soft tissues and spinal canal: No prevertebral fluid or swelling. No visible canal hematoma. Disc levels: Anterior plate and screw  fixation C5 through C7 with screw fracture bilaterally at C5. Mild degenerative changes C4-C5 and C7-T1 Upper chest: Apical emphysema Other: None IMPRESSION: 1. Postsurgical changes C5 through C7 with screw fracture bilaterally T5, noted on prior radiograph from 2018 2. No definite acute osseous abnormality. 3. Apical emphysema Electronically Signed   By: Donavan Foil M.D.   On: 12/29/2017 18:01    EKG: Independently reviewed.  Vent. rate 96 BPM PR interval 150 ms QRS duration 82 ms QT/QTc 368/464 ms P-R-T axes 72 57 58 Sinus rhythm with Possible Premature atrial complexes with Abberant conduction Anterior infarct , age undetermined Abnormal ECG  Assessment/Plan Principal Problem:   Alcohol withdrawal ([place]) Admit to stepdown/inpatient. Continue CIWA protocol with lorazepam. Thiamine replacement. Magnesium supplementation. Consider behavioral health evaluation.  Active Problems:   Essential hypertension Continue metoprolol 50 mg p.o. 3 times daily. Monitor blood pressure and heart rate.    Hyperlipidemia Currently not taking medical therapy. Currently not doing lifestyle modifications. Statin treatment may be problematic if the patient continues alcohol consumption.    Tobacco abuse Nicotine replacement therapy ordered. Tobacco cessation information to be provided by the staff.    Chronic low back pain (Secondary source of pain) (Bilateral) (R>L) Advised to use alcohol to treat his chronic back pain. Should address it with PCP.    Paroxysmal VT ([name])   Long Q-T syndrome Avoid medications that prolong QT/QTc. Continue metoprolol. Monitor electrolytes and correct as needed.    Chronic anxiety Advised to address it with PCP. Advised to avoid alcohol to treated.    GERD (gastroesophageal reflux disease) Continue Prilosec.   DVT prophylaxis: Lovenox SQ. Code Status: Full code. Family Communication:  Disposition Plan: Admit for alcohol withdrawal  treatment. Consults called:  Admission status: Inpatient/stepdown.   Reubin Milan MD [facility] Pager [phone].  If 7PM-7AM, please contact night-coverage [email or web address] Password [facility]  12/31/2017, 3:54 PM

## 2017-12-31 NOTE — ED Notes (Signed)
Dr. Olevia Bowens aware of patient's continued complaints for pain medication, and states he is not receiving any relief from the Ativan

## 2017-12-31 NOTE — ED Notes (Signed)
[place] from mini Lab sent to [facility].

## 2018-01-01 ENCOUNTER — Other Ambulatory Visit: Payer: Self-pay

## 2018-01-01 ENCOUNTER — Ambulatory Visit: Payer: Self-pay | Admitting: Gastroenterology

## 2018-01-01 DIAGNOSIS — F1110 Opioid abuse, uncomplicated: Secondary | ICD-10-CM

## 2018-01-01 DIAGNOSIS — F10230 Alcohol dependence with withdrawal, uncomplicated: Secondary | ICD-10-CM

## 2018-01-01 DIAGNOSIS — Z720 Tobacco use: Secondary | ICD-10-CM

## 2018-01-01 DIAGNOSIS — I10 Essential (primary) hypertension: Secondary | ICD-10-CM

## 2018-01-01 DIAGNOSIS — F419 Anxiety disorder, unspecified: Secondary | ICD-10-CM

## 2018-01-01 LAB — HIV ANTIBODY (ROUTINE TESTING W REFLEX): HIV Screen 4th Generation wRfx: NONREACTIVE

## 2018-01-01 LAB — BASIC METABOLIC PANEL
Anion gap: 8 (ref 5–15)
CALCIUM: 8.5 mg/dL — AB (ref 8.9–10.3)
CO2: 28 mmol/L (ref 22–32)
Chloride: 100 mmol/L (ref 98–111)
Creatinine, Ser: 0.77 mg/dL (ref 0.61–1.24)
GFR calc non Af Amer: >60 mL/min (ref >60)
Glucose, Bld: 109 mg/dL — ABNORMAL HIGH (ref 70–99)
Potassium: 3.8 mmol/L (ref 3.5–5.1)
Sodium: 136 mmol/L (ref 135–145)

## 2018-01-01 LAB — MAGNESIUM: MAGNESIUM: 2.5 mg/dL — AB (ref 1.7–2.4)

## 2018-01-01 MED ORDER — HYDROCODONE-ACETAMINOPHEN 5-325 MG PO TABS
1.0000 | ORAL_TABLET | Freq: Four times a day (QID) | ORAL | Status: DC | PRN
  Administered 2018-01-01: 1 via ORAL
  Filled 2018-01-01: qty 1

## 2018-01-01 MED ORDER — IBUPROFEN 600 MG PO TABS
600.0000 mg | ORAL_TABLET | Freq: Four times a day (QID) | ORAL | Status: DC | PRN
  Administered 2018-01-02: 600 mg via ORAL
  Filled 2018-01-01: qty 1

## 2018-01-01 MED ORDER — CLONIDINE HCL 0.2 MG PO TABS
0.2000 mg | ORAL_TABLET | Freq: Three times a day (TID) | ORAL | Status: DC
  Administered 2018-01-01 – 2018-01-02 (×4): 0.2 mg via ORAL
  Filled 2018-01-01 (×4): qty 1

## 2018-01-01 MED ORDER — HYDRALAZINE HCL 20 MG/ML IJ SOLN
10.0000 mg | Freq: Four times a day (QID) | INTRAMUSCULAR | Status: DC | PRN
  Administered 2018-01-01: 10 mg via INTRAVENOUS
  Filled 2018-01-01: qty 1

## 2018-01-01 MED ORDER — LIDOCAINE 5 % EX PTCH
2.0000 | MEDICATED_PATCH | CUTANEOUS | Status: DC
  Administered 2018-01-01: 2 via TRANSDERMAL
  Filled 2018-01-01 (×2): qty 2

## 2018-01-01 NOTE — Progress Notes (Signed)
CIWA=28 at 1510.  Patient given 3 mg IV ativan per protocol.  Dr.  [place] Pink asked to reassess patient with RN as patient was lethargic during follow up [place].  Dr [place] Pink stated to score patient with "0's" as patient is not displaying symptoms. Followup CIWA= 0 at 1705.

## 2018-01-01 NOTE — Progress Notes (Signed)
PROGRESS NOTE  Mark [name] KGY:[id number] DOB: 1965-07-08 DOA: 12/31/2017 PCP: Ladell Pier, MD  HPI/Recap of past 46 hours: 52 year old male with past medical history of anxiety and depression, prescription opiate abuse, COPD, IBS and alcohol abuse (drinks about 24 beers a day) admitted on evening of 10/23 after coming in for alcohol detox.  The patient had just been seen in the emergency room 2 days prior complaining of neck pain as well as wanting alcohol detox.  He was not having any tremors or shakes and decided he would try to treat this at home.  That he last drank approximately 9 beers on the evening of 10/22.  He was started on IV Ativan protocol.  Currently patient is resting comfortably, somnolent.  Assessment/Plan: Principal Problem:   Alcohol withdrawal ([place]): Currently on IV Ativan protocol.  Have added clonidine and PRN hydralazine for blood pressure.  Continue protocol, likely will be here for several more days Active Problems:   Essential hypertension: On metoprolol, have added clonidine and PRN hydralazine   Hyperlipidemia   Tobacco abuse: Nicotine patch   Chronic low back pain (Secondary source of pain) (Bilateral) (R>L): Can try lidocaine patch if his pain is persistent History of opioid abuse: Patient has a history of chronic back pain.  He was dismissed from a pain clinic due to failing to comply with regulations.  No pain medication while here.  We can try lidocaine patch if needed.  He might be a candidate for Suboxone, although [place] going through alcohol withdrawal, he is at high risk for seizure, so we will hold off on this.   Paroxysmal VT ([place])   Long Q-T syndrome: Monitoring with telemetry   Chronic anxiety   GERD (gastroesophageal reflux disease)   Code Status: Full code  Family Communication: Left message for family  Disposition Plan: Likely will be here for several more days as he continues to go through  withdrawals   Consultants:  None  Procedures:  None  Antimicrobials:  None  DVT prophylaxis: Lovenox   Objective: Vitals:   01/01/18 1134 01/01/18 1232  BP: (!) 160/110 (!) 142/96  Pulse:  87  Resp: (!) 22 (!) 27  Temp:    SpO2:      Intake/Output Summary (Last 24 hours) at 01/01/2018 1331 Last data filed at 01/01/2018 1000 Gross per 24 hour  Intake 360 ml  Output -  Net 360 ml   Filed Weights   12/31/17 1827 01/01/18 0423  Weight: 102.7 kg 102.6 kg   Body mass index is 27.55 kg/m.  Exam:   General: Somnolent, awakens easily, but then goes right back to sleep.  No acute distress  HEENT: Normocephalic and atraumatic, mucous members are slightly dry  Neck: Supple, no JVD  Cardiovascular: Regular rate and rhythm, S1-S2  Respiratory: Clear to auscultation bilaterally  Abdomen: Soft, nontender, nondistended, positive bowel sounds  Musculoskeletal: No clubbing or cyanosis or edema  Skin: No skin breaks, tears or lesions  Psychiatry: Appropriate, no evidence of psychoses  Neuro: No focal deficits   Data Reviewed: CBC: Recent Labs  Lab 12/31/17 1056  WBC 10.3  HGB 16.0  HCT 48.3  MCV 85.2  PLT 026   Basic Metabolic Panel: Recent Labs  Lab 12/31/17 1056 12/31/17 1631 01/01/18 0354  NA 133*  --  136  K 3.2*  --  3.8  CL 91*  --  100  CO2 26  --  28  GLUCOSE 146*  --  109*  BUN <  5*  --  <5*  CREATININE 0.74  --  0.77  CALCIUM 9.4  --  8.5*  MG 2.3  --  2.5*  PHOS  --  3.1  --    GFR: Estimated Creatinine Clearance: 132.6 mL/min (by C-G formula based on SCr of 0.77 mg/dL). Liver Function Tests: Recent Labs  Lab 12/31/17 1056  AST 31  ALT 41  ALKPHOS 107  BILITOT 0.5  PROT 6.5  ALBUMIN 3.8   No results for input(s): LIPASE, AMYLASE in the last 168 hours. No results for input(s): AMMONIA in the last 168 hours. Coagulation Profile: No results for input(s): INR, PROTIME in the last 168 hours. Cardiac Enzymes: Recent Labs   Lab 12/31/17 1631  CKTOTAL 70  TROPONINI <0.03   BNP (last 3 results) No results for input(s): PROBNP in the last 8760 hours. HbA1C: No results for input(s): HGBA1C in the last 72 hours. CBG: No results for input(s): GLUCAP in the last 168 hours. Lipid Profile: No results for input(s): CHOL, HDL, LDLCALC, TRIG, CHOLHDL, LDLDIRECT in the last 72 hours. Thyroid Function Tests: No results for input(s): TSH, T4TOTAL, FREET4, T3FREE, THYROIDAB in the last 72 hours. Anemia Panel: No results for input(s): VITAMINB12, FOLATE, FERRITIN, TIBC, IRON, RETICCTPCT in the last 72 hours. Urine analysis:    Component Value Date/Time   COLORURINE STRAW (A) 05/15/2016 2123   APPEARANCEUR Clear 04/09/2017 1022   LABSPEC 1.005 05/15/2016 2123   PHURINE 6.0 05/15/2016 2123   GLUCOSEU Negative 04/09/2017 1022   HGBUR NEGATIVE 05/15/2016 2123   BILIRUBINUR Negative 04/09/2017 1022   KETONESUR NEGATIVE 05/15/2016 2123   PROTEINUR Negative 04/09/2017 1022   PROTEINUR NEGATIVE 05/15/2016 2123   UROBILINOGEN 0.2 09/16/2014 1708   UROBILINOGEN 1.0 01/20/2013 1900   NITRITE Negative 04/09/2017 1022   NITRITE NEGATIVE 05/15/2016 2123   LEUKOCYTESUR Negative 04/09/2017 1022   Sepsis Labs: @LABRCNTIP (procalcitonin:4,lacticidven:4)  ) Recent Results (from the past 240 hour(s))  MRSA PCR Screening     Status: None   Collection Time: 12/31/17  6:38 PM  Result Value Ref Range Status   MRSA by PCR NEGATIVE NEGATIVE Final    Comment:        The GeneXpert MRSA Assay (FDA approved for NASAL specimens only), is one component of a comprehensive MRSA colonization surveillance program. It is not intended to diagnose MRSA infection nor to guide or monitor treatment for MRSA infections. Performed at [facility], [place] [address] [place], [place] [address]       Studies: No results found.  Scheduled Meds: . cloNIDine  0.2 mg Oral TID  . gabapentin  600 mg Oral TID  . LORazepam  0-4 mg  Intravenous Q6H   Or  . LORazepam  0-4 mg Oral Q6H  . [START ON 01/02/2018] LORazepam  0-4 mg Intravenous Q12H   Or  . [START ON 01/02/2018] LORazepam  0-4 mg Oral Q12H  . metoprolol tartrate  50 mg Oral TID  . nicotine  21 mg Transdermal Once  . nicotine  21 mg Transdermal Daily  . pantoprazole  40 mg Oral BID  . thiamine  100 mg Oral Daily   Or  . thiamine  100 mg Intravenous Daily    Continuous Infusions:   LOS: 1 day     Annita Brod, MD [facility]  To reach me or the doctor on call, go to: [email or web address] Password [email or web address]  01/01/2018, 1:31 PM

## 2018-01-02 NOTE — Discharge Summary (Signed)
   PT LEFT AMA SUMMARY  RYLE BUSCEMI MRN - [id number] DOB - 1965/09/23  Date of Admission - 12/31/2017 Date LEFT AMA: 01/02/2018  Attending Physician:  Cherene Altes  Patient's PCP:  Ladell Pier, MD  Disposition: LEFT AMA  Follow-up Appts:  Not able to be arranged or discussed as pt LEFT AMA  Diagnoses at time pt LEFT AMA: Alcohol withdrawal Alcoholism  Essential hypertension Hyperlipidemia Tobacco abuse Chronic low back pain History of opioid abuse Paroxysmal VT - Long QT syndrome Chronic anxiety GERD   Initial presentation: 52 year old male with a hx of anxiety and depression, prescription opiate abuse, COPD, IBS, and alcohol abuse(drinks 24 beers a day)who was admitted on the evening of 10/23 after asking for alcohol detox. He last drank approximately 9 beers on the evening of 10/22.He was started on IV Ativan protocol.  Hospital Course:  Pt was polite, but expressed his desire for narcotic pain medications for his chronic low back pain. He understood the reason these were not being given. In the absence of narcotics, he informed this MD that he would simply prefer to go home and start drinking again. I attempted to persuade him to stay so that we could complete his ativan tx course, but he refused. He was alert and oriented and fully competent mentally at the time he made this decision. I advised him that we would attempt to control his pain w/ non-narcotic meds while he completed his ativan taper, but he again refused to stay.   Listed below are the active problems present, and the status of the care of these problems, at the time the pt decided to LEAVE AMA:  Alcohol withdrawal on IV Ativan protocol  Essential hypertension  Hyperlipidemia  Tobacco abuse Nicotine patch  Chronic low back pain  History of opioid abuse was dismissed from a pain clinic due to failing to comply with regulations  Paroxysmal VT - Long Q-T syndrome Monitoring  with telemetry  Chronic anxiety  GERD     Medication List    Unable to be finalized as pt LEFT AMA  Day of Discharge Wt Readings from Last 3 Encounters:  01/02/18 102.7 kg  12/29/17 100.7 kg  12/23/17 100.7 kg   Temp Readings from Last 3 Encounters:  01/02/18 97.7 F (36.5 C) (Oral)  12/29/17 98.6 F (37 C) (Oral)  12/23/17 98.3 F (36.8 C) (Oral)   BP Readings from Last 3 Encounters:  01/02/18 (!) 122/107  12/29/17 (!) 163/94  12/23/17 131/84   Pulse Readings from Last 3 Encounters:  01/02/18 79  12/29/17 84  12/23/17 (!) 58    Physical Exam: Exam not able to be completed at time of d/c as pt LEFT AMA  11:57 AM 01/02/18  Cherene Altes, MD [facility] Office  [phone] Pager [phone]  On-Call/Text Page:      [email or web address]      password [facility]

## 2018-01-02 NOTE — Progress Notes (Signed)
Pt signed AMA and left.  Pt is aware that his medical insurance may not pay for this stay and [facility] is not responsible for any complication due to leaving against medical advice if occurred.   Idolina Primer, RN

## 2018-01-03 ENCOUNTER — Emergency Department (HOSPITAL_COMMUNITY)
Admission: EM | Admit: 2018-01-03 | Discharge: 2018-01-05 | Disposition: A | Discharge status: Other Acute Inpatient Hospital | Payer: Self-pay | Attending: Emergency Medicine | Admitting: Emergency Medicine

## 2018-01-03 ENCOUNTER — Encounter (HOSPITAL_COMMUNITY): Payer: Self-pay | Admitting: *Deleted

## 2018-01-03 ENCOUNTER — Other Ambulatory Visit: Payer: Self-pay

## 2018-01-03 ENCOUNTER — Emergency Department (HOSPITAL_COMMUNITY): Payer: Self-pay

## 2018-01-03 DIAGNOSIS — R45851 Suicidal ideations: Secondary | ICD-10-CM | POA: Insufficient documentation

## 2018-01-03 DIAGNOSIS — R1011 Right upper quadrant pain: Secondary | ICD-10-CM | POA: Insufficient documentation

## 2018-01-03 DIAGNOSIS — F1020 Alcohol dependence, uncomplicated: Secondary | ICD-10-CM | POA: Diagnosis present

## 2018-01-03 DIAGNOSIS — J449 Chronic obstructive pulmonary disease, unspecified: Secondary | ICD-10-CM | POA: Insufficient documentation

## 2018-01-03 DIAGNOSIS — F332 Major depressive disorder, recurrent severe without psychotic features: Secondary | ICD-10-CM | POA: Insufficient documentation

## 2018-01-03 DIAGNOSIS — I10 Essential (primary) hypertension: Secondary | ICD-10-CM | POA: Insufficient documentation

## 2018-01-03 DIAGNOSIS — Z79899 Other long term (current) drug therapy: Secondary | ICD-10-CM | POA: Insufficient documentation

## 2018-01-03 DIAGNOSIS — R109 Unspecified abdominal pain: Secondary | ICD-10-CM

## 2018-01-03 DIAGNOSIS — Z23 Encounter for immunization: Secondary | ICD-10-CM | POA: Insufficient documentation

## 2018-01-03 DIAGNOSIS — F17210 Nicotine dependence, cigarettes, uncomplicated: Secondary | ICD-10-CM | POA: Insufficient documentation

## 2018-01-03 LAB — SALICYLATE LEVEL: Salicylate Lvl: <7 mg/dL (ref 2.8–30.0)

## 2018-01-03 LAB — COMPREHENSIVE METABOLIC PANEL
ALT: 36 U/L (ref 0–44)
AST: 43 U/L — ABNORMAL HIGH (ref 15–41)
Albumin: 3.8 g/dL (ref 3.5–5.0)
Alkaline Phosphatase: 98 U/L (ref 38–126)
Anion gap: 12 (ref 5–15)
BUN: 6 mg/dL (ref 6–20)
CO2: 25 mmol/L (ref 22–32)
Calcium: 8.7 mg/dL — ABNORMAL LOW (ref 8.9–10.3)
Chloride: 102 mmol/L (ref 98–111)
Creatinine, Ser: 0.65 mg/dL (ref 0.61–1.24)
GFR calc Af Amer: >60 mL/min (ref >60)
GFR calc non Af Amer: >60 mL/min (ref >60)
Glucose, Bld: 111 mg/dL — ABNORMAL HIGH (ref 70–99)
Potassium: 3.8 mmol/L (ref 3.5–5.1)
Sodium: 139 mmol/L (ref 135–145)
Total Bilirubin: 0.3 mg/dL (ref 0.3–1.2)
Total Protein: 6.8 g/dL (ref 6.5–8.1)

## 2018-01-03 LAB — CBC
HCT: 44.6 % (ref 39.0–52.0)
Hemoglobin: 15 g/dL (ref 13.0–17.0)
MCH: 28.7 pg (ref 26.0–34.0)
MCHC: 33.6 g/dL (ref 30.0–36.0)
MCV: 85.4 fL (ref 80.0–100.0)
Platelets: 271 10*3/uL (ref 150–400)
RBC: 5.22 MIL/uL (ref 4.22–5.81)
RDW: 12.8 % (ref 11.5–15.5)
WBC: 8.3 10*3/uL (ref 4.0–10.5)
nRBC: 0 % (ref 0.0–0.2)

## 2018-01-03 LAB — RAPID URINE DRUG SCREEN, HOSP PERFORMED
Amphetamines: NOT DETECTED
Barbiturates: NOT DETECTED
Benzodiazepines: NOT DETECTED
Cocaine: NOT DETECTED
Opiates: NOT DETECTED
Tetrahydrocannabinol: NOT DETECTED

## 2018-01-03 LAB — LIPASE, BLOOD: Lipase: 38 U/L (ref 11–51)

## 2018-01-03 LAB — ACETAMINOPHEN LEVEL: Acetaminophen (Tylenol), Serum: <10 ug/mL — ABNORMAL LOW (ref 10–30)

## 2018-01-03 LAB — ETHANOL: Alcohol, Ethyl (B): 83 mg/dL — ABNORMAL HIGH (ref <10)

## 2018-01-03 MED ORDER — VITAMIN B-1 100 MG PO TABS
100.0000 mg | ORAL_TABLET | Freq: Every day | ORAL | Status: DC
  Administered 2018-01-03 – 2018-01-05 (×3): 100 mg via ORAL
  Filled 2018-01-03 (×3): qty 1

## 2018-01-03 MED ORDER — PANTOPRAZOLE SODIUM 40 MG PO TBEC
40.0000 mg | DELAYED_RELEASE_TABLET | Freq: Every day | ORAL | Status: DC
  Administered 2018-01-03 – 2018-01-04 (×2): 40 mg via ORAL
  Filled 2018-01-03 (×2): qty 1

## 2018-01-03 MED ORDER — OMEPRAZOLE MAGNESIUM 20 MG PO TBEC: 20.0000 mg | DELAYED_RELEASE_TABLET | Freq: Two times a day (BID) | ORAL | Status: DC

## 2018-01-03 MED ORDER — KETOROLAC TROMETHAMINE 15 MG/ML IJ SOLN
15.0000 mg | Freq: Once | INTRAMUSCULAR | Status: AC
  Administered 2018-01-03: 15 mg via INTRAMUSCULAR
  Filled 2018-01-03: qty 1

## 2018-01-03 MED ORDER — LORAZEPAM 1 MG PO TABS
1.0000 mg | ORAL_TABLET | Freq: Once | ORAL | Status: AC
  Administered 2018-01-03: 1 mg via ORAL
  Filled 2018-01-03: qty 1

## 2018-01-03 MED ORDER — LORAZEPAM 1 MG PO TABS: 0.0000 mg | ORAL_TABLET | Freq: Two times a day (BID) | ORAL | Status: DC

## 2018-01-03 MED ORDER — METOPROLOL TARTRATE 25 MG PO TABS
50.0000 mg | ORAL_TABLET | Freq: Three times a day (TID) | ORAL | Status: DC
  Administered 2018-01-03 – 2018-01-05 (×6): 50 mg via ORAL
  Filled 2018-01-03 (×6): qty 2

## 2018-01-03 MED ORDER — LORAZEPAM 1 MG PO TABS
0.0000 mg | ORAL_TABLET | Freq: Four times a day (QID) | ORAL | Status: AC
  Administered 2018-01-03: 1 mg via ORAL
  Administered 2018-01-03: 2 mg via ORAL
  Administered 2018-01-04: 1 mg via ORAL
  Administered 2018-01-04 (×3): 2 mg via ORAL
  Administered 2018-01-05 (×2): 1 mg via ORAL
  Filled 2018-01-03: qty 1
  Filled 2018-01-03 (×3): qty 2
  Filled 2018-01-03: qty 1
  Filled 2018-01-03: qty 2
  Filled 2018-01-03 (×2): qty 1

## 2018-01-03 MED ORDER — PROMETHAZINE HCL 25 MG PO TABS
12.5000 mg | ORAL_TABLET | Freq: Every day | ORAL | Status: DC | PRN
  Administered 2018-01-03: 12.5 mg via ORAL
  Filled 2018-01-03: qty 1

## 2018-01-03 MED ORDER — FAMOTIDINE 10 MG PO TABS
10.0000 mg | ORAL_TABLET | Freq: Two times a day (BID) | ORAL | Status: DC | PRN
  Filled 2018-01-03: qty 1

## 2018-01-03 MED ORDER — CYCLOBENZAPRINE HCL 10 MG PO TABS
10.0000 mg | ORAL_TABLET | Freq: Every evening | ORAL | Status: DC | PRN
  Administered 2018-01-03: 10 mg via ORAL
  Filled 2018-01-03: qty 1

## 2018-01-03 MED ORDER — GABAPENTIN 300 MG PO CAPS
600.0000 mg | ORAL_CAPSULE | Freq: Three times a day (TID) | ORAL | Status: DC
  Administered 2018-01-03 – 2018-01-05 (×6): 600 mg via ORAL
  Filled 2018-01-03 (×6): qty 2

## 2018-01-03 MED ORDER — NICOTINE 21 MG/24HR TD PT24
21.0000 mg | MEDICATED_PATCH | Freq: Once | TRANSDERMAL | Status: AC
  Administered 2018-01-03: 21 mg via TRANSDERMAL
  Filled 2018-01-03: qty 1

## 2018-01-03 MED ORDER — ALBUTEROL SULFATE HFA 108 (90 BASE) MCG/ACT IN AERS: 2.0000 | INHALATION_SPRAY | Freq: Four times a day (QID) | RESPIRATORY_TRACT | Status: DC | PRN

## 2018-01-03 MED ORDER — LORAZEPAM 2 MG/ML IJ SOLN: 0.0000 mg | Freq: Two times a day (BID) | INTRAMUSCULAR | Status: DC

## 2018-01-03 MED ORDER — KETOROLAC TROMETHAMINE 15 MG/ML IJ SOLN
15.0000 mg | Freq: Once | INTRAMUSCULAR | Status: AC
  Filled 2018-01-03: qty 1

## 2018-01-03 MED ORDER — TETANUS-DIPHTH-ACELL PERTUSSIS 5-2.5-18.5 LF-MCG/0.5 IM SUSP
0.5000 mL | Freq: Once | INTRAMUSCULAR | Status: AC
  Administered 2018-01-03: 0.5 mL via INTRAMUSCULAR
  Filled 2018-01-03: qty 0.5

## 2018-01-03 MED ORDER — TRAZODONE HCL 100 MG PO TABS
100.0000 mg | ORAL_TABLET | Freq: Once | ORAL | Status: AC
  Administered 2018-01-03: 100 mg via ORAL
  Filled 2018-01-03: qty 1

## 2018-01-03 MED ORDER — LORAZEPAM 2 MG/ML IJ SOLN: 0.0000 mg | Freq: Four times a day (QID) | INTRAMUSCULAR | Status: AC

## 2018-01-03 MED ORDER — THIAMINE HCL 100 MG/ML IJ SOLN: 100.0000 mg | Freq: Every day | INTRAMUSCULAR | Status: DC

## 2018-01-03 NOTE — ED Provider Notes (Signed)
[name] DEPT Provider Note   CSN: [id number] Arrival date & time: 01/03/18  1403     History   Chief Complaint Chief Complaint  Patient presents with  . Suicidal  . IVC    HPI Mark [name] is a 52 y.o. male.  The history is provided by the patient and medical records. No language interpreter was used.   Mark [name] is a 52 y.o. male  with a PMH of ETOH abuse with recent withdrawal requiring hospitalization who presents to the Emergency Department under IVC by daughter for concerns of patient safety.  Per IVC paperwork, patient was seeing demons, verbally aggressive and cut his neck last night in an attempt at suicide.  Patient reports that he drank a lot and does not remember the event.  He woke up this morning and noticed a cut to the left side of his neck.  He is unsure of his tetanus status.  He states that he does have chronic pain and has been suffering from abdominal pain for the last month.  He does not feel like his symptoms are ever going to get any better and sometimes wants to "just end it all" because he is tired of dealing with the pain every day.  He denies any auditory or visual hallucinations to me, however does state that he does not remember anything from the events last night.  His last drink was a few beers just to stop his shakes a couple hours prior to ER arrival.  He does report suicidal thoughts to me as well.  He states that he has been struggling with abdominal pain over the last month.  He has been nauseous daily without relief from Phenergan prescribed.  He denies any vomiting.  Pain is mostly to the epigastrium and gets worse after eating.  Denies any fever or chills.  No chest pain, shortness of breath, diarrhea, constipation or blood in the stool.  Past Medical History:  Diagnosis Date  . Alcohol withdrawal ([place])   . Allergy   . Anxiety Dx 2014  . COPD (chronic obstructive pulmonary disease) ([place])   . Drug abuse  ([place])    pt reports opioid dependence due to previous back surgeries  . Enlarged prostate   . ETOH abuse   . GERD (gastroesophageal reflux disease) Dx 2003  . Headache(784.0)   . Hepatitis   . Hyperlipidemia Dx 2000  . Hypertension Dx 2011  . IBS (irritable bowel syndrome)    alternates constiaption/ diarrhea  . Irregular heart beat   . 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 Mental disorder   . Neuromuscular disorder ([place])    hands hurt   . Neuropathy 06/07/2013  . Seizure due to alcohol withdrawal ([place]) 01/23/2014   Per patient report  . Shortness of breath   . Withdrawal seizures ([place]) 2014   last seizure 2014 etoh with drawal    Patient Active Problem List   Diagnosis Date Noted  . Alcohol withdrawal ([place]) 12/31/2017  . Pain management contract broken 12/15/2017  . Dyspepsia   . Opioid-induced hyperalgesia (with oxycodone) 02/11/2017  . Allodynia (Lower extremities) 12/26/2016  . Hyperalgesia (Lower extremities) 12/26/2016  . Sympathetic pain (lower extremity) 09/26/2016  . Lower extremity neuropathy (Primary Source of Pain) (Bilateral) (R>L) 09/26/2016  . Lumbar facet syndrome (Bilateral) (R>L) 08/22/2016  . Chronic sacroiliac joint pain (Bilateral) (R>L) 08/22/2016  . Chronic lower extremity pain (Bilateral) (R>L) 06/26/2016  . Opiate use 05/14/2016  . Occipital headache (  Bilateral) (R>L) 04/16/2016  . Musculoskeletal pain 04/15/2016  . 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 Major depressive disorder, recurrent episode, moderate ([place]) 04/10/2016  . Long term current use of opiate analgesic 03/18/2016  . Long term prescription opiate use 03/18/2016  . Chronic neck pain (Bilateral)(R>L) 03/18/2016  . Cervical fusion syndrome 03/18/2016  . 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 Chronic knee pain  (Bilateral) (R>L) 03/18/2016  . GAD (generalized anxiety disorder) 12/06/2015  . Alcohol use disorder, severe, in early remission ([place]) 12/06/2015  . GERD  (gastroesophageal reflux disease) 07/24/2015  . Chronic anxiety 12/19/2014  . Erectile dysfunction 09/16/2014  . Poor dentition 07/28/2014  . Chronic maxillary sinusitis 07/28/2014  . Vitamin D insufficiency 07/05/2014  . Chronic fatigue 07/04/2014  . Chronic radicular low back pain [facility] source of pain) (Right) (to calf) 06/02/2014  . Chronic shoulder pain (Right) 05/12/2014  . Alcohol use disorder, severe, dependence ([place])   . 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 Hypokalemia   . Opioid dependence ([place]) 01/22/2014    Class: Acute  . Paroxysmal VT ([place]) 12/19/2013  . Chronic low back pain (Secondary source of pain) (Bilateral) (R>L) 12/28/2012  . Chronic pain syndrome 12/28/2012  . Diarrhea 12/28/2012  . Alcohol intoxication ([place]) 06/09/2012  . 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 Nonsustained ventricular tachycardia ([place]) 01/20/2012  . Tobacco abuse 01/19/2012  . Essential hypertension   . Hyperlipidemia     Past Surgical History:  Procedure Laterality Date  . [place] STUDY N/A 01/16/2015   Procedure: [place] STUDY;  Surgeon: Manus Gunning, MD;  Location: [facility];  Service: Gastroenterology;  Laterality: N/A;  . [place]  . COLONOSCOPY  1998  . LEFT HEART CATHETERIZATION WITH CORONARY ANGIOGRAM N/A 01/13/2014   Procedure: LEFT HEART CATHETERIZATION WITH CORONARY ANGIOGRAM;  Surgeon: Clent Demark, MD;  Location: [facility] CATH LAB;  Service: Cardiovascular;  Laterality: N/A;  . NASAL SINUS SURGERY    . NECK SURGERY  2000   cervial fusion   . NISSEN FUNDOPLICATION    . SHOULDER SURGERY Right   . SUBACROMIAL DECOMPRESSION Right 11/16/2014   Procedure: RIGHT SHOULDER ARTHROSCOPY WITH SUBACROMIAL DECOMPRESSION ;  Surgeon: Leandrew Koyanagi, MD;  Location: [facility];  Service: Orthopedics;  Laterality: Right;  . UPPER GASTROINTESTINAL ENDOSCOPY          Home Medications    Prior to Admission medications     Medication Sig Start Date End Date Taking? Authorizing Provider  albuterol (VENTOLIN HFA) 108 (90 Base) MCG/ACT inhaler Inhale 2 puffs into the lungs every 6 (six) hours as needed for wheezing or shortness of breath. 08/12/17  Yes Ladell Pier, MD  cyclobenzaprine (FLEXERIL) 10 MG tablet Take 1 tablet (10 mg total) by mouth at bedtime. Patient taking differently: Take 10 mg by mouth at bedtime as needed for muscle spasms.  09/18/17 01/03/18 Yes Vevelyn Francois, NP  famotidine (PEPCID AC) 10 MG chewable tablet Chew 10 mg by mouth as needed for heartburn.   Yes [provider]  gabapentin (NEURONTIN) 600 MG tablet Take 1 tablet (600 mg total) by mouth 3 (three) times daily. 12/23/17  Yes Ladell Pier, MD  metoprolol (LOPRESSOR) 50 MG tablet Take 1 tablet (50 mg total) by mouth 2 (two) times daily. Patient taking differently: Take 50 mg by mouth 3 (three) times daily.  07/24/15  Yes Funches, Adriana Mccallum, MD  nicotine (NICODERM CQ - DOSED  IN MG/24 HOURS) 21 mg/24hr patch Place 1 patch (21 mg total) onto the skin daily. 12/23/17  Yes Ladell Pier, MD  omeprazole (PRILOSEC OTC) 20 MG tablet Take 20 mg by mouth 2 (two) times daily.   Yes [provider]  promethazine (PHENERGAN) 12.5 MG tablet Take 1 tablet (12.5 mg total) by mouth daily as needed for nausea or vomiting. Each prescription to last a mth Patient taking differently: Take 12.5 mg by mouth daily as needed for nausea or vomiting.  12/23/17  Yes Ladell Pier, MD  traZODone (DESYREL) 100 MG tablet Take 1 tablet (100 mg total) by mouth at bedtime as needed. Patient taking differently: Take 50-100 mg by mouth at bedtime as needed for sleep.  08/21/17  Yes Ladell Pier, MD  nicotine (NICODERM CQ - DOSED IN MG/24 HOURS) 14 mg/24hr patch Place 1 patch (14 mg total) onto the skin daily. Patient not taking: Reported on 01/03/2018 12/23/17   Ladell Pier, MD    Family History Family History  Problem  Relation Age of Onset  . Lung cancer Father        was a smoker  . Alcohol abuse Father   . Hypertension Mother   . Colon polyps Mother   . Breast cancer Paternal Grandmother   . Alcohol abuse Paternal Grandfather   . Colon cancer Neg Hx   . Rectal cancer Neg Hx   . Stomach cancer Neg Hx     Social History Social History   Tobacco Use  . Smoking status: Current Every Day Smoker    Packs/day: 0.50    Years: 30.00    Pack years: 15.00    Types: Cigarettes  . Smokeless tobacco: Never Used  Substance Use Topics  . Alcohol use: Yes    Alcohol/week: 0.0 standard drinks    Comment: Case a beer a day   . Drug use: No    Comment: Opana     Allergies   Cozaar [losartan] and Lisinopril   Review of Systems Review of Systems  Gastrointestinal: Positive for abdominal pain and nausea. Negative for blood in stool, constipation, diarrhea and vomiting.  Skin: Positive for wound.  Psychiatric/Behavioral: Positive for suicidal ideas.  All other systems reviewed and are negative.    Physical Exam Updated Vital Signs BP (!) 165/106 (BP Location: Left Arm)   Pulse 79   Temp 98.6 F (37 C) (Oral)   Resp 20   Ht 6\' 4"  (1.93 m)   Wt 102.7 kg   SpO2 99%   BMI 27.56 kg/m   Physical Exam  Constitutional: He is oriented to person, place, and time. He appears well-developed and well-nourished. No distress.  HENT:  Head: Normocephalic.  Neck: Neck supple.  Abrasion to the left anterior side of the neck.  No active bleeding or drainage.  No overlying tenderness to the area.  No surrounding erythema.  Cardiovascular: Normal rate, regular rhythm and normal heart sounds.  No murmur heard. Pulmonary/Chest: Effort normal and breath sounds normal. No respiratory distress.  Abdominal: Soft. He exhibits no distension.  Tenderness to palpation of epigastrium and right upper quadrant without rebound or guarding.  Negative Murphy's.  Neurological: He is alert and oriented to person, place,  and time.  Skin: Skin is warm and dry.  Nursing note and vitals reviewed.    ED Treatments / Results  Labs (all labs ordered are listed, but only abnormal results are displayed) Labs Reviewed  COMPREHENSIVE METABOLIC PANEL - Abnormal; Notable  for the following components:      Result Value   Glucose, Bld 111 (*)    Calcium 8.7 (*)    AST 43 (*)    All other components within normal limits  ETHANOL - Abnormal; Notable for the following components:   Alcohol, Ethyl (B) 83 (*)    All other components within normal limits  ACETAMINOPHEN LEVEL - Abnormal; Notable for the following components:   Acetaminophen (Tylenol), Serum <10 (*)    All other components within normal limits  SALICYLATE LEVEL  CBC  RAPID URINE DRUG SCREEN, HOSP PERFORMED  LIPASE, BLOOD    EKG None  Radiology No results found.  Procedures Procedures (including critical care time)  Medications Ordered in ED Medications  LORazepam (ATIVAN) injection 0-4 mg (has no administration in time range)    Or  LORazepam (ATIVAN) tablet 0-4 mg (has no administration in time range)  LORazepam (ATIVAN) injection 0-4 mg (has no administration in time range)    Or  LORazepam (ATIVAN) tablet 0-4 mg (has no administration in time range)  thiamine (VITAMIN B-1) tablet 100 mg (has no administration in time range)    Or  thiamine (B-1) injection 100 mg (has no administration in time range)  nicotine (NICODERM CQ - dosed in mg/24 hours) patch 21 mg (has no administration in time range)  ketorolac (TORADOL) 15 MG/ML injection 15 mg (has no administration in time range)    Or  ketorolac (TORADOL) 15 MG/ML injection 15 mg (has no administration in time range)  albuterol (PROVENTIL HFA;VENTOLIN HFA) 108 (90 Base) MCG/ACT inhaler 2 puff (has no administration in time range)  cyclobenzaprine (FLEXERIL) tablet 10 mg (has no administration in time range)  famotidine (PEPCID) tablet 10 mg (has no administration in time range)    gabapentin (NEURONTIN) capsule 600 mg (has no administration in time range)  metoprolol tartrate (LOPRESSOR) tablet 50 mg (has no administration in time range)  promethazine (PHENERGAN) tablet 12.5 mg (has no administration in time range)  Tdap (BOOSTRIX) injection 0.5 mL (has no administration in time range)  pantoprazole (PROTONIX) EC tablet 40 mg (has no administration in time range)     Initial Impression / Assessment and Plan / ED Course  I have reviewed the triage vital signs and the nursing notes.  Pertinent labs & imaging results that were available during my care of the patient were reviewed by me and considered in my medical decision making (see chart for details).    Mark [name] is a 52 y.o. male who presents to ED under IVC after suicide attempt last night. Apparently tried to cut his neck. He doesn't remember, stating he was drunk when it happened. He does have cut to his neck, although it is quite superficial. Tetanus up-to-date. Complaining of abdominal pain. Labs reassuring. RUQ ultrasound reassuring as well. This has been an ongoing issue. Put in for pepcid BID. He does have hx of ETOH abuse and withdrawal requiring admission. Initially was tremulous with elevated BP although he had not taken BP meds either. Given ativan and home metoprolol. BP improved to 148/88. He is currently medically cleared. Spoke with his nurse to ensure frequent CIWA checks and he does have orders for ativan in under CIWA protocol. TTS recommends inpatient placement.   Patient seen by and discussed with Dr. Wilson Singer who agrees with treatment plan.    Final Clinical Impressions(s) / ED Diagnoses   Final diagnoses:  Abdominal pain    ED Discharge Orders  None       Ward, Ozella Almond, PA-C 01/03/18 2129    Virgel Manifold, MD 01/04/18 [phone]

## 2018-01-03 NOTE — ED Notes (Signed)
On the phone 

## 2018-01-03 NOTE — ED Notes (Signed)
Pt talking on hallway phone.  

## 2018-01-03 NOTE — BH Assessment (Signed)
[facility] Assessment Progress Note Case was staffed with Reita Cliche DNP who recommended a inpatient admission to assist with symptom management

## 2018-01-03 NOTE — ED Notes (Signed)
Reports still with some nausea, PO fluids encouraged, much less shakey/anxious, watching tv. Verbal permission to give daughter Loma Sousa information

## 2018-01-03 NOTE — ED Triage Notes (Signed)
Pt BIB [facility] with IVC paperwork taken out by his daughter, stating pt is seening demons, verbally aggressive, drinking multiple beers per day, has attempted suicide by cutting neck, wound not bleeding. Pt has been calm with [facility] and is cooperative with staff.

## 2018-01-03 NOTE — ED Notes (Addendum)
Pt ambulatory to room 35 , belongings locked in locker 35.  Pt also reports that the toradol has not helped much and is requesting additional pain medications.  Pt reports that he still does not remember how he cut his neck.

## 2018-01-03 NOTE — ED Notes (Signed)
Pt belongings  documented with RN and placed in locker 53.

## 2018-01-03 NOTE — ED Notes (Signed)
Pt alert/oriented, nad.  Pt reports generalized pain and a ha 8/10, procedures explained.

## 2018-01-03 NOTE — ED Notes (Signed)
Pt tearful on approach. Reports having chronic pain. Self inflicted cut noted to pt left neck. Pt reports this happen last night, but does not remember d/t he had been drinking. Endorsing SI. Pt dressed out, safety sitter present. Will continue to monitor.

## 2018-01-03 NOTE — ED Notes (Signed)
[place] in for exam

## 2018-01-03 NOTE — ED Notes (Signed)
Lab will add lipase to blood in lab.

## 2018-01-03 NOTE — BH Assessment (Signed)
Assessment Note  Mark [name] is an 52 y.o. male that presents this date with IVC. Per IVC: "Respondent drinks 12 to 24 beers daily. Respondent cut his neck last night and made multiple claims of killing himself". Patient is observed to have lacerations to the left side of his neck. Patient reports he was intoxicated last night stating he consumed over 10 twelve ounce  beers and "blacked out." Patient reports he does not recall harming himself last night although he recalls waking up this morning and "blood was all over his bed." Patient states he resides with his mother who found him. Patient states he consumes over 12 beers daily due to chronic pain management issues. Patient denies any previous attempts/gestures at self harm. Patient is currently denying any thoughts of self harm, H/I or AVH. Patient reports he was last seen at [facility] on 05/16/17 for S/I and ETOH issues. Patient reports daily alcohol use although denies any other SA issues. Patient reports current withdrawals to include: tremors, agitation, nausea and racing thoughts. Patient is observed to be tremulous and displays some thought blocking. Patient is a poor historian and renders limited history. Patient states he cannot recall the incident that occurred last night. Per notes, "Patient has a history of ETOH abuse with recent withdrawal requiring hospitalization (11/06/17 - 11/09/17) who presents to the Emergency Department under IVC by daughter for concerns of patient safety. Per IVC paperwork, patient was seeing demons, verbally aggressive and cut his neck last night in an attempt at suicide. Patient reports that he drank a lot and does not remember the event. He woke up this morning and noticed a cut to the left side of his neck. He states that he does have chronic pain and has been suffering from abdominal pain for the last month. He does not feel like his symptoms are ever going to get any better and sometimes wants to "just end it all" because  he is tired of dealing with the pain every day. He denies any auditory or visual hallucinations. His last drink was a few beers just to stop his shakes a couple hours prior to ER arrival". Patient denies any current OP provider although states he was diagnosed with depression over a year ago by his PCP. Patient denies any current medication interventions reporting ongoing symptoms to include: anhedonia, guilt and excessive fatigue. Case was staffed with Reita Cliche DNP who recommended a inpatient admission to assist with symptom management.       Diagnosis: F33.2 MDD recurrent without psychotic symptoms, severe, Alcohol abuse  Past Medical History:  Past Medical History:  Diagnosis Date  . Alcohol withdrawal ([place])   . Allergy   . Anxiety Dx 2014  . COPD (chronic obstructive pulmonary disease) ([place])   . Drug abuse ([place])    pt reports opioid dependence due to previous back surgeries  . Enlarged prostate   . ETOH abuse   . GERD (gastroesophageal reflux disease) Dx 2003  . Headache(784.0)   . Hepatitis   . Hyperlipidemia Dx 2000  . Hypertension Dx 2011  . IBS (irritable bowel syndrome)    alternates constiaption/ diarrhea  . Irregular heart beat   . 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 Mental disorder   . Neuromuscular disorder ([place])    hands hurt   . Neuropathy 06/07/2013  . Seizure due to alcohol withdrawal ([place]) 01/23/2014   Per patient report  . Shortness of breath   . Withdrawal seizures ([place]) 2014   last seizure 2014 etoh  with drawal    Past Surgical History:  Procedure Laterality Date  . [place] STUDY N/A 01/16/2015   Procedure: [place] STUDY;  Surgeon: Manus Gunning, MD;  Location: [facility] ENDOSCOPY;  Service: Gastroenterology;  Laterality: N/A;  . [place]  . COLONOSCOPY  1998  . LEFT HEART CATHETERIZATION WITH CORONARY ANGIOGRAM N/A 01/13/2014   Procedure: LEFT HEART CATHETERIZATION WITH CORONARY ANGIOGRAM;  Surgeon: Clent Demark, MD;  Location: [facility] CATH  LAB;  Service: Cardiovascular;  Laterality: N/A;  . NASAL SINUS SURGERY    . NECK SURGERY  2000   cervial fusion   . NISSEN FUNDOPLICATION    . SHOULDER SURGERY Right   . SUBACROMIAL DECOMPRESSION Right 11/16/2014   Procedure: RIGHT SHOULDER ARTHROSCOPY WITH SUBACROMIAL DECOMPRESSION ;  Surgeon: Leandrew Koyanagi, MD;  Location: [facility];  Service: Orthopedics;  Laterality: Right;  . UPPER GASTROINTESTINAL ENDOSCOPY      Family History:  Family History  Problem Relation Age of Onset  . Lung cancer Father        was a smoker  . Alcohol abuse Father   . Hypertension Mother   . Colon polyps Mother   . Breast cancer Paternal Grandmother   . Alcohol abuse Paternal Grandfather   . Colon cancer Neg Hx   . Rectal cancer Neg Hx   . Stomach cancer Neg Hx     Social History:  reports that he has been smoking cigarettes. He has a 15.00 pack-year smoking history. He has never used smokeless tobacco. He reports that he drinks alcohol. He reports that he does not use drugs.  Additional Social History:  Alcohol / Drug Use Pain Medications: See Chart  Prescriptions: See Chart  Over the Counter: See Chart  History of alcohol / drug use?: Yes Longest period of sobriety (when/how long): 8 months  then 5 day binge drinking which ended up in involunteery hospitalization 11/07/15 - 11/10/15 intoxication and suicidal ideation Negative Consequences of Use: Financial, Personal relationships Withdrawal Symptoms: Weakness, Agitation, Sweats, Tremors Substance #1 Name of Substance 1: Alcohol 1 - Age of First Use: 21 1 - Amount (size/oz): 12 oz beers  1 - Frequency: Daily 1 - Duration: Last 5 years 1 - Last Use / Amount: 01/03/18 12 12  oz beers  CIWA: CIWA-Ar BP: (!) 168/96 Pulse Rate: 77 Nausea and Vomiting: mild nausea with no vomiting Tactile Disturbances: very mild itching, pins and needles, burning or numbness Tremor: moderate, with patient's arms extended Auditory Disturbances: not  present Paroxysmal Sweats: no sweat visible Visual Disturbances: not present Anxiety: three Headache, Fullness in Head: moderate Agitation: normal activity Orientation and Clouding of Sensorium: oriented and can do serial additions CIWA-Ar Total: 12 COWS:    Allergies:  Allergies  Allergen Reactions  . Cozaar [Losartan] Anaphylaxis  . Lisinopril Anaphylaxis    Home Medications:  (Not in a hospital admission)  OB/GYN Status:  No LMP for male patient.  General Assessment Data Location of Assessment: [facility] TTS Assessment: In system Is this a Tele or Face-to-Face Assessment?: Face-to-Face Is this an Initial Assessment or a Re-assessment for this encounter?: Initial Assessment Patient Accompanied by:: N/A Language Other than English: No Living Arrangements: (Mother) What gender do you identify as?: Male Marital status: Single Maiden name: Nicholson Pregnancy Status: No Living Arrangements: Parent Can pt return to current living arrangement?: Yes Admission Status: Involuntary Petitioner: Family member Is patient capable of signing voluntary admission?: Yes Referral Source: Self/Family/Friend Google  type: Self Pay  Medical Screening Exam ([place]) Medical Exam completed: Yes  Crisis Care Plan Living Arrangements: Parent Legal Guardian: (NA) Name of Psychiatrist: None Name of Therapist: None  Education Status Is patient currently in school?: No Is the patient employed, unemployed or receiving disability?: Unemployed  Risk to self with the past 6 months Suicidal Ideation: No Has patient been a risk to self within the past 6 months prior to admission? : Yes Suicidal Intent: No Has patient had any suicidal intent within the past 6 months prior to admission? : No Is patient at risk for suicide?: Yes Suicidal Plan?: No Has patient had any suicidal plan within the past 6 months prior to admission? : No Access to Means: No What has been your use of  drugs/alcohol within the last 12 months?: Current use Previous Attempts/Gestures: No How many times?: 0 Other Self Harm Risks: (Excessive SA use) Triggers for Past Attempts: Other (Comment)(Chronic pain) Intentional Self Injurious Behavior: Cutting Comment - Self Injurious Behavior: Pt cut earlier this date Family Suicide History: No Recent stressful life event(s): Other (Comment)(Chronic pain ) Persecutory voices/beliefs?: No Depression: Yes Depression Symptoms: Guilt, Loss of interest in usual pleasures Substance abuse history and/or treatment for substance abuse?: No Suicide prevention information given to non-admitted patients: Not applicable  Risk to Others within the past 6 months Homicidal Ideation: No Does patient have any lifetime risk of violence toward others beyond the six months prior to admission? : No Thoughts of Harm to Others: No Current Homicidal Intent: No Current Homicidal Plan: No Access to Homicidal Means: No Identified Victim: NA History of harm to others?: No Assessment of Violence: None Noted Violent Behavior Description: NA Does patient have access to weapons?: No Criminal Charges Pending?: No Does patient have a court date: No Is patient on probation?: No  Psychosis Hallucinations: None noted Delusions: None noted  Mental Status Report Appearance/Hygiene: In scrubs Eye Contact: Good Motor Activity: Freedom of movement Speech: Logical/coherent Level of Consciousness: Quiet/awake Mood: Depressed Affect: Appropriate to circumstance Anxiety Level: Moderate Thought Processes: Thought Blocking Judgement: Partial Orientation: Person, Place, Time Obsessive Compulsive Thoughts/Behaviors: None  Cognitive Functioning Concentration: Decreased Memory: Recent Intact, Remote Intact Is patient IDD: No Insight: Fair Impulse Control: Poor Appetite: Fair Have you had any weight changes? : No Change Sleep: No Change Total Hours of Sleep: 5 Vegetative  Symptoms: None  ADLScreening [facility] Assessment Services) Patient's cognitive ability adequate to safely complete daily activities?: Yes Patient able to express need for assistance with ADLs?: Yes Independently performs ADLs?: Yes (appropriate for developmental age)  Prior Inpatient Therapy Prior Inpatient Therapy: Yes Prior Therapy Dates: 2019, 2018 Prior Therapy Facilty/Provider(s): [facility] Reason for Treatment: MH/SA issues  Prior Outpatient Therapy Prior Outpatient Therapy: No Does patient have an ACCT team?: No Does patient have Intensive In-House Services?  : No Does patient have [facility] services? : No Does patient have P4CC services?: No  ADL Screening (condition at time of admission) Patient's cognitive ability adequate to safely complete daily activities?: Yes Is the patient deaf or have difficulty hearing?: No Does the patient have difficulty seeing, even when wearing glasses/contacts?: No Does the patient have difficulty concentrating, remembering, or making decisions?: No Patient able to express need for assistance with ADLs?: Yes Does the patient have difficulty dressing or bathing?: No Independently performs ADLs?: Yes (appropriate for developmental age) Does the patient have difficulty walking or climbing stairs?: No Weakness of Legs: None Weakness of Arms/Hands: None  Home Assistive Devices/Equipment Home  Assistive Devices/Equipment: None  Therapy Consults (therapy consults require a physician order) PT Evaluation Needed: No OT Evalulation Needed: No SLP Evaluation Needed: No Abuse/Neglect Assessment (Assessment to be complete while patient is alone) Physical Abuse: Denies Verbal Abuse: Denies Sexual Abuse: Denies Exploitation of patient/patient's resources: Denies Self-Neglect: Denies Values / Beliefs Cultural Requests During Hospitalization: None Spiritual Requests During Hospitalization: None Consults Spiritual Care Consult Needed: No Social Work  Consult Needed: No Regulatory affairs officer (For Healthcare) Does Patient Have a Medical Advance Directive?: No Would patient like information on creating a medical advance directive?: No - Patient declined          Disposition: Case was staffed with Reita Cliche DNP who recommended a inpatient admission to assist with symptom management Disposition Initial Assessment Completed for this Encounter: Yes Disposition of Patient: Admit Type of inpatient treatment program: Adult Patient refused recommended treatment: No Mode of transportation if patient is discharged?: Tomasita Crumble)  On Site Evaluation by:   Reviewed with Physician:    Mamie Nick 01/03/2018 5:09 PM

## 2018-01-03 NOTE — ED Notes (Signed)
NAD, talking with sitter

## 2018-01-03 NOTE — ED Notes (Signed)
Up to the bathroom 

## 2018-01-03 NOTE — ED Notes (Signed)
TTS into see 

## 2018-01-03 NOTE — ED Notes (Signed)
Pt alert and oriented, pt denies any si, hi, or avh. Pt complains of chronic pain. Pt resting quietly in bed, will continue to monitor.

## 2018-01-03 NOTE — ED Notes (Signed)
Pt to room #35, oriented to unit. Pt c/o generalized chronic pain 10/10. Pt also complains of withdrawal symptoms. C/o anxiety.  Informs this nurse he drank a 12 pack case of beer this morning. Pt flush in face, tremors noted. This nurse notified EDP [place], awaiting orders. Special checks q 15 mins in place for safety, Video monitoring in place. Will continue

## 2018-01-04 DIAGNOSIS — R45851 Suicidal ideations: Secondary | ICD-10-CM

## 2018-01-04 DIAGNOSIS — F332 Major depressive disorder, recurrent severe without psychotic features: Secondary | ICD-10-CM

## 2018-01-04 MED ORDER — LORAZEPAM 1 MG PO TABS
1.0000 mg | ORAL_TABLET | Freq: Once | ORAL | Status: AC
  Administered 2018-01-04: 1 mg via ORAL
  Filled 2018-01-04: qty 1

## 2018-01-04 MED ORDER — TRAZODONE HCL 100 MG PO TABS
100.0000 mg | ORAL_TABLET | Freq: Every evening | ORAL | Status: DC | PRN
  Administered 2018-01-04: 100 mg via ORAL
  Filled 2018-01-04: qty 1

## 2018-01-04 MED ORDER — IBUPROFEN 800 MG PO TABS
800.0000 mg | ORAL_TABLET | Freq: Four times a day (QID) | ORAL | Status: DC | PRN
  Administered 2018-01-04 – 2018-01-05 (×2): 800 mg via ORAL
  Filled 2018-01-04 (×2): qty 1

## 2018-01-04 MED ORDER — ACETAMINOPHEN 325 MG PO TABS: 650.0000 mg | ORAL_TABLET | ORAL | Status: DC | PRN

## 2018-01-04 MED ORDER — CYCLOBENZAPRINE HCL 10 MG PO TABS
10.0000 mg | ORAL_TABLET | Freq: Three times a day (TID) | ORAL | Status: DC | PRN
  Administered 2018-01-04 (×2): 10 mg via ORAL
  Filled 2018-01-04 (×2): qty 1

## 2018-01-04 NOTE — Consult Note (Addendum)
[facility] Consult   Reason for Consult:  Suicide attempt Referring Physician:  EDP Patient Identification: Mark [name] MRN:  [id number] Principal Diagnosis: Major depressive disorder, recurrent episode, severe ([place]) Diagnosis:   Patient Active Problem List   Diagnosis Date Noted  . Major depressive disorder, recurrent episode, severe ([place]) [F33.2]     Priority: High  . Alcohol use disorder, severe, dependence ([place]) [F10.20]     Priority: High  . Alcohol withdrawal ([place]) [F10.239] 12/31/2017  . Pain management contract broken [Z91.14] 12/15/2017  . Dyspepsia [R10.13]   . Opioid-induced hyperalgesia (with oxycodone) [R20.8, T40.2X5A] 02/11/2017  . Allodynia (Lower extremities) [R20.8] 12/26/2016  . Hyperalgesia (Lower extremities) [R20.8] 12/26/2016  . Sympathetic pain (lower extremity) [M79.2] 09/26/2016  . Lower extremity neuropathy (Primary Source of Pain) (Bilateral) (R>L) [G57.93] 09/26/2016  . Lumbar facet syndrome (Bilateral) (R>L) [M47.816] 08/22/2016  . Chronic sacroiliac joint pain (Bilateral) (R>L) [M53.3, G89.29] 08/22/2016  . Chronic lower extremity pain (Bilateral) (R>L) [M79.604, M79.605, G89.29] 06/26/2016  . Opiate use [F11.90] 05/14/2016  . Occipital headache (Bilateral) (R>L) [R51] 04/16/2016  . Musculoskeletal pain [M79.18] 04/15/2016  . 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 Major depressive disorder, recurrent episode, moderate ([place]) [F33.1] 04/10/2016  . Long term current use of opiate analgesic [Z79.891] 03/18/2016  . Long term prescription opiate use [Z79.891] 03/18/2016  . Chronic neck pain (Bilateral)(R>L) [M54.2, G89.29] 03/18/2016  . Cervical fusion syndrome [Q76.1] 03/18/2016  . Failed back surgical syndrome [M96.1] 03/18/2016  . Chronic knee pain  (Bilateral) (R>L) [M25.561, M25.562, G89.29] 03/18/2016  . GAD (generalized anxiety disorder) [F41.1] 12/06/2015  . Alcohol use disorder, severe, in early  remission ([place]) [F10.21] 12/06/2015  . GERD (gastroesophageal reflux disease) [K21.9] 07/24/2015  . Chronic anxiety [F41.9] 12/19/2014  . Erectile dysfunction [N52.9] 09/16/2014  . Poor dentition [K08.9] 07/28/2014  . Chronic maxillary sinusitis [J32.0] 07/28/2014  . Vitamin D insufficiency [E55.9] 07/05/2014  . Chronic fatigue [R53.82] 07/04/2014  . Chronic radicular low back pain [facility] source of pain) (Right) (to calf) [M54.16, G89.29] 06/02/2014  . Chronic shoulder pain (Right) [M25.511, G89.29] 05/12/2014  . Long Q-T syndrome [I45.81] 01/23/2014  . Hypokalemia [E87.6]   . Opioid dependence ([place]) [F11.20] 01/22/2014    Class: Acute  . Paroxysmal VT ([place]) [I47.2] 12/19/2013  . Chronic low back pain (Secondary source of pain) (Bilateral) (R>L) [M54.42, M54.41, G89.29] 12/28/2012  . Chronic pain syndrome [G89.4] 12/28/2012  . Diarrhea [R19.7] 12/28/2012  . Alcohol intoxication ([place]) [F10.929] 06/09/2012  . 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 Nonsustained ventricular tachycardia ([place]) [I47.2] 01/20/2012  . Tobacco abuse [Z72.0] 01/19/2012  . Essential hypertension [I10]   . Hyperlipidemia [E78.5]     Total Time spent with patient: 45 minutes  Subjective:   Mark [name] is a 51 y.o. male patient admitted with suicide attempt.  HPI:  52 yo male who presented to the ED after being IVC'd for cutting his throat yesterday after drinking.  He does not remember this incident.  Depression is 8/10 with continuation of suicidal ideations but contracts for safety.  Depression has increased lately with his pain, just wants to end it all.  Endorses feelings of helplessness and hopelessness.  Alcohol makes his depression temporarily better but worse long-term.  No homicidal ideations, hallucinations.  Some tremors and sweating, Ativan given.    Past Psychiatric History: depression, substance abuse  Risk to Self: Suicidal Ideation: No Suicidal Intent:  No Is patient at risk for suicide?: Yes Suicidal  Plan?: No Access to Means: No What has been your use of drugs/alcohol within the last 12 months?: Current use How many times?: 0 Other Self Harm Risks: (Excessive SA use) Triggers for Past Attempts: Other (Comment)(Chronic pain) Intentional Self Injurious Behavior: Cutting Comment - Self Injurious Behavior: Pt cut earlier this date Risk to Others: Homicidal Ideation: No Thoughts of Harm to Others: No Current Homicidal Intent: No Current Homicidal Plan: No Access to Homicidal Means: No Identified Victim: NA History of harm to others?: No Assessment of Violence: None Noted Violent Behavior Description: NA Does patient have access to weapons?: No Criminal Charges Pending?: No Does patient have a court date: No Prior Inpatient Therapy: Prior Inpatient Therapy: Yes Prior Therapy Dates: 2019, 2018 Prior Therapy Facilty/Provider(s): [facility], [facility] Reason for Treatment: MH/SA issues Prior Outpatient Therapy: Prior Outpatient Therapy: No Does patient have an ACCT team?: No Does patient have Intensive In-House Services?  : No Does patient have [facility] services? : No Does patient have P4CC services?: No  Past Medical History:  Past Medical History:  Diagnosis Date  . Alcohol withdrawal ([place])   . Allergy   . Anxiety Dx 2014  . COPD (chronic obstructive pulmonary disease) ([place])   . Drug abuse ([place])    pt reports opioid dependence due to previous back surgeries  . Enlarged prostate   . ETOH abuse   . GERD (gastroesophageal reflux disease) Dx 2003  . Headache(784.0)   . Hepatitis   . Hyperlipidemia Dx 2000  . Hypertension Dx 2011  . IBS (irritable bowel syndrome)    alternates constiaption/ diarrhea  . Irregular heart beat   . 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 Mental disorder   . Neuromuscular disorder ([place])    hands hurt   . Neuropathy 06/07/2013  . Seizure due to alcohol withdrawal ([place]) 01/23/2014   Per patient report  . Shortness  of breath   . Withdrawal seizures ([place]) 2014   last seizure 2014 etoh with drawal    Past Surgical History:  Procedure Laterality Date  . [place] STUDY N/A 01/16/2015   Procedure: [place] STUDY;  Surgeon: Manus Gunning, MD;  Location: [facility];  Service: Gastroenterology;  Laterality: N/A;  . [place]  . COLONOSCOPY  1998  . LEFT HEART CATHETERIZATION WITH CORONARY ANGIOGRAM N/A 01/13/2014   Procedure: LEFT HEART CATHETERIZATION WITH CORONARY ANGIOGRAM;  Surgeon: Clent Demark, MD;  Location: [facility];  Service: Cardiovascular;  Laterality: N/A;  . NASAL SINUS SURGERY    . NECK SURGERY  2000   cervial fusion   . NISSEN FUNDOPLICATION    . SHOULDER SURGERY Right   . SUBACROMIAL DECOMPRESSION Right 11/16/2014   Procedure: RIGHT SHOULDER ARTHROSCOPY WITH SUBACROMIAL DECOMPRESSION ;  Surgeon: Leandrew Koyanagi, MD;  Location: [facility];  Service: Orthopedics;  Laterality: Right;  . UPPER GASTROINTESTINAL ENDOSCOPY     Family History:  Family History  Problem Relation Age of Onset  . Lung cancer Father        was a smoker  . Alcohol abuse Father   . Hypertension Mother   . Colon polyps Mother   . Breast cancer Paternal Grandmother   . Alcohol abuse Paternal Grandfather   . Colon cancer Neg Hx   . Rectal cancer Neg Hx   . Stomach cancer Neg Hx    Family Psychiatric  History: see above Social History:  Social History   Substance and Sexual Activity  Alcohol  Use Yes  . Alcohol/week: 0.0 standard drinks   Comment: Case a beer a day      Social History   Substance and Sexual Activity  Drug Use No   Comment: Opana    Social History   Socioeconomic History  . Marital status: Divorced    Spouse name: Not on file  . Number of children: 2  . Years of education: Not on file  . Highest education level: Not on file  Occupational History  . Occupation: [employer]   Social Needs  . Financial resource strain: Not on file  . Food  insecurity:    Worry: Not on file    Inability: Not on file  . Transportation needs:    Medical: Not on file    Non-medical: Not on file  Tobacco Use  . Smoking status: Current Every Day Smoker    Packs/day: 0.50    Years: 30.00    Pack years: 15.00    Types: Cigarettes  . Smokeless tobacco: Never Used  Substance and Sexual Activity  . Alcohol use: Yes    Alcohol/week: 0.0 standard drinks    Comment: Case a beer a day   . Drug use: No    Comment: Opana  . Sexual activity: Not Currently  Lifestyle  . Physical activity:    Days per week: Not on file    Minutes per session: Not on file  . Stress: Not on file  Relationships  . Social connections:    Talks on phone: Not on file    Gets together: Not on file    Attends religious service: Not on file    Active member of club or organization: Not on file    Attends meetings of clubs or organizations: Not on file    Relationship status: Not on file  Other Topics Concern  . Not on file  Social History Narrative  . Not on file   Additional Social History:    Allergies:   Allergies  Allergen Reactions  . Cozaar [Losartan] Anaphylaxis  . Lisinopril Anaphylaxis    Labs:  Results for orders placed or performed during the hospital encounter of 01/03/18 (from the past 48 hour(s))  Rapid urine drug screen (hospital performed)     Status: None   Collection Time: 01/03/18  2:15 PM  Result Value Ref Range   Opiates NONE DETECTED NONE DETECTED   Cocaine NONE DETECTED NONE DETECTED   Benzodiazepines NONE DETECTED NONE DETECTED   Amphetamines NONE DETECTED NONE DETECTED   Tetrahydrocannabinol NONE DETECTED NONE DETECTED   Barbiturates NONE DETECTED NONE DETECTED    Comment: (NOTE) DRUG SCREEN FOR MEDICAL PURPOSES ONLY.  IF CONFIRMATION IS NEEDED FOR ANY PURPOSE, NOTIFY LAB WITHIN 5 DAYS. LOWEST DETECTABLE LIMITS FOR URINE DRUG SCREEN Drug Class                     Cutoff (ng/mL) Amphetamine and metabolites     1000 Barbiturate and metabolites    200 Benzodiazepine                 250 Tricyclics and metabolites     300 Opiates and metabolites        300 Cocaine and metabolites        300 THC                            50 Performed at [facility], [place]  [address] [place], [place] [address]   Lipase, blood     Status: None   Collection Time: 01/03/18  2:15 PM  Result Value Ref Range   Lipase 38 11 - 51 U/L    Comment: Performed at [facility], [place] [address] [place], [place] [address]  Comprehensive metabolic panel     Status: Abnormal   Collection Time: 01/03/18  2:50 PM  Result Value Ref Range   Sodium 139 135 - 145 mmol/L   Potassium 3.8 3.5 - 5.1 mmol/L   Chloride 102 98 - 111 mmol/L   CO2 25 22 - 32 mmol/L   Glucose, Bld 111 (H) 70 - 99 mg/dL   BUN 6 6 - 20 mg/dL   Creatinine, Ser 0.65 0.61 - 1.24 mg/dL   Calcium 8.7 (L) 8.9 - 10.3 mg/dL   Total Protein 6.8 6.5 - 8.1 g/dL   Albumin 3.8 3.5 - 5.0 g/dL   AST 43 (H) 15 - 41 U/L   ALT 36 0 - 44 U/L   Alkaline Phosphatase 98 38 - 126 U/L   Total Bilirubin 0.3 0.3 - 1.2 mg/dL   GFR calc non Af Amer >60 >60 mL/min   GFR calc Af Amer >60 >60 mL/min    Comment: (NOTE) The eGFR has been calculated using the CKD EPI equation. This calculation has not been validated in all clinical situations. eGFR's persistently <60 mL/min signify possible Chronic Kidney Disease.    Anion gap 12 5 - 15    Comment: Performed at [facility], [place] [address], [place], [place] [address]  Ethanol     Status: Abnormal   Collection Time: 01/03/18  2:50 PM  Result Value Ref Range   Alcohol, Ethyl (B) 83 (H) <10 mg/dL    Comment: (NOTE) Lowest detectable limit for serum alcohol is 10 mg/dL. For medical purposes only. Performed at [facility], [place] [address] [place], [place] [address]   Salicylate level     Status: None   Collection Time: 01/03/18  2:50 PM  Result Value Ref  Range   Salicylate Lvl <2.5 2.8 - 30.0 mg/dL    Comment: Performed at [facility], [place] [address], [place], [place] [address]  Acetaminophen level     Status: Abnormal   Collection Time: 01/03/18  2:50 PM  Result Value Ref Range   Acetaminophen (Tylenol), Serum <10 (L) 10 - 30 ug/mL    Comment: (NOTE) Therapeutic concentrations vary significantly. A range of 10-30 ug/mL  may be an effective concentration for many patients. However, some  are best treated at concentrations outside of this range. Acetaminophen concentrations >150 ug/mL at 4 hours after ingestion  and >50 ug/mL at 12 hours after ingestion are often associated with  toxic reactions. Performed at [facility], [place] [address] [place],  [address]   cbc     Status: None   Collection Time: 01/03/18  2:50 PM  Result Value Ref Range   WBC 8.3 4.0 - 10.5 K/uL   RBC 5.22 4.22 - 5.81 MIL/uL   Hemoglobin 15.0 13.0 - 17.0 g/dL   HCT 44.6 39.0 - 52.0 %   MCV 85.4 80.0 - 100.0 fL   MCH 28.7 26.0 - 34.0 pg   MCHC 33.6 30.0 - 36.0 g/dL   RDW 12.8 11.5 - 15.5 %   Platelets 271 150 - 400 K/uL   nRBC 0.0 0.0 - 0.2 %    Comment: Performed at [facility]  [place] [address], [place], [place] [address]    Current Facility-Administered Medications  Medication Dose Route Frequency Provider Last Rate Last Dose  . albuterol (PROVENTIL HFA;VENTOLIN HFA) 108 (90 Base) MCG/ACT inhaler 2 puff  2 puff Inhalation Q6H PRN Ward, Ozella Almond, PA-C      . cyclobenzaprine (FLEXERIL) tablet 10 mg  10 mg Oral QHS PRN Ward, Ozella Almond, PA-C   10 mg at 01/03/18 2107  . famotidine (PEPCID) tablet 10 mg  10 mg Oral BID PRN Ward, Ozella Almond, PA-C      . gabapentin (NEURONTIN) capsule 600 mg  600 mg Oral TID Ward, Ozella Almond, PA-C   600 mg at 01/04/18 0923  . LORazepam (ATIVAN) injection 0-4 mg  0-4 mg Intravenous Q6H Ward, Ozella Almond, PA-C       Or  . LORazepam (ATIVAN) tablet 0-4  mg  0-4 mg Oral Q6H Ward, Ozella Almond, PA-C   2 mg at 01/04/18 0847  . [START ON 01/05/2018] LORazepam (ATIVAN) injection 0-4 mg  0-4 mg Intravenous Q12H Ward, Ozella Almond, PA-C       Or  . Derrill Memo ON 01/05/2018] LORazepam (ATIVAN) tablet 0-4 mg  0-4 mg Oral Q12H Ward, Ozella Almond, PA-C      . metoprolol tartrate (LOPRESSOR) tablet 50 mg  50 mg Oral TID Ward, Ozella Almond, PA-C   50 mg at 01/04/18 3159  . nicotine (NICODERM CQ - dosed in mg/24 hours) patch 21 mg  21 mg Transdermal Once Ward, Ozella Almond, PA-C   21 mg at 01/03/18 1635  . pantoprazole (PROTONIX) EC tablet 40 mg  40 mg Oral Q1200 Virgel Manifold, MD   40 mg at 01/03/18 1659  . promethazine (PHENERGAN) tablet 12.5 mg  12.5 mg Oral Daily PRN Ward, Ozella Almond, PA-C   12.5 mg at 01/03/18 1730  . thiamine (VITAMIN B-1) tablet 100 mg  100 mg Oral Daily Ward, Ozella Almond, PA-C   100 mg at 01/04/18 4585   Or  . thiamine (B-1) injection 100 mg  100 mg Intravenous Daily Ward, Ozella Almond, PA-C       Current Outpatient Medications  Medication Sig Dispense Refill  . albuterol (VENTOLIN HFA) 108 (90 Base) MCG/ACT inhaler Inhale 2 puffs into the lungs every 6 (six) hours as needed for wheezing or shortness of breath. 18 g 3  . cyclobenzaprine (FLEXERIL) 10 MG tablet Take 1 tablet (10 mg total) by mouth at bedtime. (Patient taking differently: Take 10 mg by mouth at bedtime as needed for muscle spasms. ) 30 tablet 2  . famotidine (PEPCID AC) 10 MG chewable tablet Chew 10 mg by mouth as needed for heartburn.    . gabapentin (NEURONTIN) 600 MG tablet Take 1 tablet (600 mg total) by mouth 3 (three) times daily. 90 tablet 1  . metoprolol (LOPRESSOR) 50 MG tablet Take 1 tablet (50 mg total) by mouth 2 (two) times daily. (Patient taking differently: Take 50 mg by mouth 3 (three) times daily. ) 60 tablet 11  . nicotine (NICODERM CQ - DOSED IN MG/24 HOURS) 21 mg/24hr patch Place 1 patch (21 mg total) onto the skin daily. 28 patch 0  .  omeprazole (PRILOSEC OTC) 20 MG tablet Take 20 mg by mouth 2 (two) times daily.    . promethazine (PHENERGAN) 12.5 MG tablet Take 1 tablet (12.5 mg total) by mouth daily as needed for nausea or vomiting. Each prescription to last a mth (Patient taking differently: Take 12.5 mg by mouth  daily as needed for nausea or vomiting. ) 15 tablet 1  . traZODone (DESYREL) 100 MG tablet Take 1 tablet (100 mg total) by mouth at bedtime as needed. (Patient taking differently: Take 50-100 mg by mouth at bedtime as needed for sleep. ) 30 tablet 3  . nicotine (NICODERM CQ - DOSED IN MG/24 HOURS) 14 mg/24hr patch Place 1 patch (14 mg total) onto the skin daily. (Patient not taking: Reported on 01/03/2018) 28 patch 0    Musculoskeletal: Strength & Muscle Tone: within normal limits Gait & Station: normal Patient leans: N/A  Psychiatric Specialty Exam: Physical Exam  Nursing note and vitals reviewed. Constitutional: He is oriented to person, place, and time. He appears well-developed and well-nourished.  HENT:  Head: Normocephalic.  Neck: Normal range of motion.  Respiratory: Effort normal.  Musculoskeletal: Normal range of motion.  Neurological: He is alert and oriented to person, place, and time.  Psychiatric: His speech is normal and behavior is normal. His affect is blunt. Cognition and memory are normal. He expresses impulsivity. He exhibits a depressed mood. He expresses suicidal ideation. He expresses suicidal plans.    Review of Systems  Psychiatric/Behavioral: Positive for depression, substance abuse and suicidal ideas.  All other systems reviewed and are negative.   Blood pressure (!) 160/98, pulse 69, temperature 97.7 F (36.5 C), temperature source Axillary, resp. rate 20, height _0  (1.93 m), weight 102.7 kg, SpO2 96 %.Body mass index is 27.56 kg/m.  General Appearance: Casual  Eye Contact:  Fair  Speech:  Normal Rate  Volume:  Decreased  Mood:  Depressed  Affect:  Blunt  Thought  Process:  Coherent and Descriptions of Associations: Intact  Orientation:  Full (Time, Place, and Person)  Thought Content:  Rumination  Suicidal Thoughts:  Yes.  with intent/plan  Homicidal Thoughts:  No  Memory:  Immediate;   Fair Recent;   Fair Remote;   Fair  Judgement:  Poor  Insight:  Fair  Psychomotor Activity:  Decreased  Concentration:  Concentration: Fair and Attention Span: Fair  Recall:  [employer] of Knowledge:  Fair  Language:  Good  Akathisia:  No  Handed:  Right  AIMS (if indicated):     Assets:  Housing Leisure Time Physical Health Resilience Social Support  ADL's:  Intact  Cognition:  WNL  Sleep:        Treatment Plan Summary: Daily contact with patient to assess and evaluate symptoms and progress in treatment, Medication management and Plan major depressive disorder, reccurrent, severe without psychosis:  -Started Ativan alcohol detox protocol  Disposition: Recommend psychiatric Inpatient admission when medically cleared.  Waylan Boga, NP 01/04/2018 10:51 AM  Patient seen face-to-face for psychiatric evaluation, chart reviewed and case discussed with the physician extender and developed treatment plan. Reviewed the information documented and agree with the treatment plan. Corena Pilgrim, MD

## 2018-01-04 NOTE — ED Notes (Signed)
Patient continues to endorse SI and denies HI/AVH. Patient is able to contracts for safety while on unit. Encouragement and support provided and safety maintain. Q 15 min safety checks remain in place and video monitoring.

## 2018-01-04 NOTE — ED Notes (Signed)
Pt continues to complain of chronic pain 10/10- refusing PRN Tylenol, requesting narcotic pain medication. This nurse notified Jameson,NP awaiting orders.

## 2018-01-04 NOTE — ED Notes (Signed)
Pt guarded on approach. Encouragement and support provided. Complaint with medication regimen. Special checks q 15 mins in place for safety, Video monitoring in place. Will continue to monitor.

## 2018-01-04 NOTE — Progress Notes (Addendum)
This patient continues to meet inpatient criteria. CSW fax information to the following facilities:   [facility]- declined [facility]- will be reviewed [facility]- has beds, will review [facility], [place] Social Worker [phone]

## 2018-01-04 NOTE — ED Notes (Signed)
Pt c/o chronic generalized pain 10/10. This nurse notified EDP Zenia Resides- awaiting orders.

## 2018-01-04 NOTE — ED Notes (Signed)
Ativan given per dr Florina Ou. He ordered to give 0330 dose of ativan according to ciwa scale.

## 2018-01-04 NOTE — ED Notes (Signed)
This nurse offered pt PRN Tylenol. Pt laughed "That won't do anything for me." Refusing PRN Tylenol. Emotional support provided.

## 2018-01-05 NOTE — Progress Notes (Addendum)
Intake and Assessment from [facility] called again today to advise they are reviewing the patient.  Areatha Keas. Judi Cong, MSW, [place] Disposition Clinical Social Work [phone] (cell) [phone] (office)  Patient accepted to [facility], MD is the accepting/attending provider Call report to [phone] Patient is IVC'd PLEASE FAX IVC TO [phone] Patient must be transported by Law Enforcement Patient may arrive at [facility] as soon as transport can be arranged.  Areatha Keas. Judi Cong, MSW, [place] Disposition Clinical Social Work [phone] (cell) [phone] (office)

## 2018-01-08 MED FILL — traZODone HCL 50 MG TABS: 50 | 15 days supply | Qty: 30 | Fill #0

## 2018-01-08 MED FILL — ?METOPROLOL 50 MG TABLET: 50 | 30 days supply | Qty: 90 | Fill #0

## 2018-01-08 MED FILL — HYDROCHLOROTHIAZIDE 25 MG T: 25 | 30 days supply | Qty: 30 | Fill #0

## 2018-01-19 ENCOUNTER — Telehealth: Payer: Self-pay | Admitting: Internal Medicine

## 2018-01-19 MED FILL — PROMETHAZINE 12.5 MG TABLET: 12.5 | 30 days supply | Qty: 15 | Fill #1

## 2018-01-19 NOTE — Telephone Encounter (Signed)
Patient called to see if he could get a financial appointment however, is unsure if he was approved and when his financial expires. Please follow up with patient.

## 2018-01-19 NOTE — Telephone Encounter (Signed)
Pt was called back inform him that CAFA exp 01/31/18 and also that is Potential Medicaid and need to contact the hospital to find out the statu of the application

## 2018-01-26 MED FILL — ?PROPRANOLOL 10 MG TABLET: 10 | 30 days supply | Qty: 60 | Fill #0

## 2018-01-28 MED FILL — GABAPENTIN 300 MG CAPSULE: 300 | 15 days supply | Qty: 90 | Fill #0

## 2018-01-28 MED FILL — CYCLOBENZAPRINE 10 MG TAB: 10 | 30 days supply | Qty: 30 | Fill #1

## 2018-01-28 MED FILL — ?TRAZODONE HCL 50MG TABS: 50 | 30 days supply | Qty: 60 | Fill #0

## 2018-02-19 MED FILL — METOPROLOL TARTRATE 50 MG T: 50 | 30 days supply | Qty: 90 | Fill #2

## 2018-02-19 MED FILL — ?PROPRANOLOL 10 MG TABLET: 10 | 30 days supply | Qty: 60 | Fill #1

## 2018-02-23 ENCOUNTER — Telehealth: Payer: Self-pay | Admitting: Internal Medicine

## 2018-02-23 NOTE — Telephone Encounter (Signed)
-----   Message from Roetta Sessions sent at 01/20/2018  3:13 PM EST ----- Regarding: declined referral After clinical review nothing further we could offer this patient

## 2018-03-12 ENCOUNTER — Other Ambulatory Visit: Payer: Self-pay

## 2018-03-12 ENCOUNTER — Ambulatory Visit: Payer: Self-pay | Attending: Nurse Practitioner | Admitting: Nurse Practitioner

## 2018-03-12 ENCOUNTER — Encounter: Payer: Self-pay | Admitting: Nurse Practitioner

## 2018-03-12 VITALS — BP 165/108 | HR 79 | Temp 98.3°F | Resp 18 | Ht 76.0 in | Wt 220.0 lb

## 2018-03-12 DIAGNOSIS — E785 Hyperlipidemia, unspecified: Secondary | ICD-10-CM | POA: Insufficient documentation

## 2018-03-12 DIAGNOSIS — R110 Nausea: Secondary | ICD-10-CM

## 2018-03-12 DIAGNOSIS — M7918 Myalgia, other site: Secondary | ICD-10-CM | POA: Insufficient documentation

## 2018-03-12 DIAGNOSIS — K219 Gastro-esophageal reflux disease without esophagitis: Secondary | ICD-10-CM | POA: Insufficient documentation

## 2018-03-12 DIAGNOSIS — E559 Vitamin D deficiency, unspecified: Secondary | ICD-10-CM | POA: Insufficient documentation

## 2018-03-12 DIAGNOSIS — J449 Chronic obstructive pulmonary disease, unspecified: Secondary | ICD-10-CM | POA: Insufficient documentation

## 2018-03-12 DIAGNOSIS — F17210 Nicotine dependence, cigarettes, uncomplicated: Secondary | ICD-10-CM | POA: Insufficient documentation

## 2018-03-12 DIAGNOSIS — G629 Polyneuropathy, unspecified: Secondary | ICD-10-CM | POA: Insufficient documentation

## 2018-03-12 DIAGNOSIS — M542 Cervicalgia: Secondary | ICD-10-CM | POA: Insufficient documentation

## 2018-03-12 DIAGNOSIS — M5386 Other specified dorsopathies, lumbar region: Secondary | ICD-10-CM | POA: Insufficient documentation

## 2018-03-12 DIAGNOSIS — G894 Chronic pain syndrome: Secondary | ICD-10-CM | POA: Insufficient documentation

## 2018-03-12 DIAGNOSIS — Z79891 Long term (current) use of opiate analgesic: Secondary | ICD-10-CM | POA: Insufficient documentation

## 2018-03-12 DIAGNOSIS — Z79899 Other long term (current) drug therapy: Secondary | ICD-10-CM | POA: Insufficient documentation

## 2018-03-12 DIAGNOSIS — I10 Essential (primary) hypertension: Secondary | ICD-10-CM | POA: Insufficient documentation

## 2018-03-12 DIAGNOSIS — E876 Hypokalemia: Secondary | ICD-10-CM | POA: Insufficient documentation

## 2018-03-12 DIAGNOSIS — Z888 Allergy status to other drugs, medicaments and biological substances status: Secondary | ICD-10-CM | POA: Insufficient documentation

## 2018-03-12 DIAGNOSIS — M533 Sacrococcygeal disorders, not elsewhere classified: Secondary | ICD-10-CM | POA: Insufficient documentation

## 2018-03-12 DIAGNOSIS — M47816 Spondylosis without myelopathy or radiculopathy, lumbar region: Secondary | ICD-10-CM

## 2018-03-12 DIAGNOSIS — Z8249 Family history of ischemic heart disease and other diseases of the circulatory system: Secondary | ICD-10-CM | POA: Insufficient documentation

## 2018-03-12 DIAGNOSIS — N400 Enlarged prostate without lower urinary tract symptoms: Secondary | ICD-10-CM | POA: Insufficient documentation

## 2018-03-12 MED ORDER — PROMETHAZINE HCL 12.5 MG PO TABS: 12.5000 mg | ORAL_TABLET | Freq: Every day | ORAL | 1 refills | Status: DC | PRN

## 2018-03-12 MED ORDER — GABAPENTIN 300 MG PO CAPS: 300.0000 mg | ORAL_CAPSULE | Freq: Three times a day (TID) | ORAL | 2 refills | Status: DC

## 2018-03-12 MED ORDER — CYCLOBENZAPRINE HCL 10 MG PO TABS: 10.0000 mg | ORAL_TABLET | Freq: Every day | ORAL | 2 refills | Status: DC

## 2018-03-12 MED FILL — PROMETHAZINE 12.5 MG TABLET: 12.5 | 30 days supply | Qty: 10 | Fill #0

## 2018-03-12 MED FILL — CYCLOBENZAPRINE 10 MG TAB: 10 | 30 days supply | Qty: 30 | Fill #0

## 2018-03-12 MED FILL — GABAPENTIN 300 MG CAPSULE: 300 | 30 days supply | Qty: 90 | Fill #0

## 2018-03-12 NOTE — Patient Instructions (Addendum)
____________________________________________________________________________________________  Appointment Policy Summary  It is our goal and responsibility to provide the medical community with assistance in the evaluation and management of patients with chronic pain. Unfortunately our resources are limited. Because we do not have an unlimited amount of time, or available appointments, we are required to closely monitor and manage their use. The following rules exist to maximize their use:  Patient's responsibilities: 1. Punctuality:  At what time should I arrive? You should be physically present in our office 30 minutes before your scheduled appointment. Your scheduled appointment is with your assigned healthcare provider. However, it takes 5-10 minutes to be "checked-in", and another 15 minutes for the nurses to do the admission. If you arrive to our office at the time you were given for your appointment, you will end up being at least 20-25 minutes late to your appointment with the provider. 2. Tardiness:  What happens if I arrive only a few minutes after my scheduled appointment time? You will need to reschedule your appointment. The cutoff is your appointment time. This is why it is so important that you arrive at least 30 minutes before that appointment. If you have an appointment scheduled for 10:00 AM and you arrive at 10:01, you will be required to reschedule your appointment.  3. Plan ahead:  Always assume that you will encounter traffic on your way in. Plan for it. If you are dependent on a driver, make sure they understand these rules and the need to arrive early. 4. Other appointments and responsibilities:  Avoid scheduling any other appointments before or after your pain clinic appointments.  5. Be prepared:  Write down everything that you need to discuss with your healthcare provider and give this information to the admitting nurse. Write down the medications that you will need  refilled. Bring your pills and bottles (even the empty ones), to all of your appointments, except for those where a procedure is scheduled. 6. No children or pets:  Find someone to take care of them. It is not appropriate to bring them in. 7. Scheduling changes:  We request "advanced notification" of any changes or cancellations. 8. Advanced notification:  Defined as a time period of more than 24 hours prior to the originally scheduled appointment. This allows for the appointment to be offered to other patients. 9. Rescheduling:  When a visit is rescheduled, it will require the cancellation of the original appointment. For this reason they both fall within the category of "Cancellations".  10. Cancellations:  They require advanced notification. Any cancellation less than 24 hours before the  appointment will be recorded as a "No Show". 11. No Show:  Defined as an unkept appointment where the patient failed to notify or declare to the practice their intention or inability to keep the appointment.  Corrective process for repeat offenders:  1. Tardiness: Three (3) episodes of rescheduling due to late arrivals will be recorded as one (1) "No Show". 2. Cancellation or reschedule: Three (3) cancellations or rescheduling will be recorded as one (1) "No Show". 3. "No Shows": Three (3) "No Shows" within a 12 month period will result in discharge from the practice. ____________________________________________________________________________________________  ALL PRESCRIPTIONS WERE E-SCRIBED TO YOUR PHARMACY OF CHOICE.

## 2018-03-12 NOTE — Progress Notes (Signed)
Safety precautions to be maintained throughout the outpatient stay will include: orient to surroundings, keep bed in low position, maintain call bell within reach at all times, provide assistance with transfer out of bed and ambulation.  

## 2018-03-12 NOTE — Progress Notes (Signed)
Patient's Name: Mark [name]  MRN: [id number]  Referring Provider: Ladell Pier, MD  DOB: 08/06/1965  PCP: Ladell Pier, MD  DOS: 03/12/2018  Note by: Vevelyn Francois NP  Service setting: Ambulatory outpatient  Specialty: Interventional Pain Management  Location: [facility] (AMB) Pain Management Facility    Patient type: Established    Primary Reason(s) for Visit: Encounter for prescription drug management. (Level of risk: moderate)  CC: Back Pain (low, mid and upper) and Neck Pain  HPI  Mark [name] is a 53 y.o. year old, male patient, who comes today for a medication management evaluation. 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 ([place]); Alcohol intoxication ([place]); Chronic low back pain (Secondary source of pain) (Bilateral) (R>L); Chronic pain syndrome; Diarrhea; Paroxysmal VT ([place]); Opioid dependence ([place]); Long Q-T syndrome; Hypokalemia; Alcohol use disorder, severe, dependence ([place]); Major depressive disorder, recurrent episode, severe ([place]); Chronic shoulder pain (Right); Chronic radicular low back pain [place] source of pain) (Right) (to calf); Chronic fatigue; Vitamin D insufficiency; Poor dentition; Chronic maxillary sinusitis; Erectile dysfunction; Chronic anxiety; GERD (gastroesophageal reflux disease); GAD (generalized anxiety disorder); Alcohol use disorder, severe, in early remission ([place]); Long term current use of opiate analgesic; Long term prescription opiate use; Chronic neck pain (Bilateral)(R>L); Cervical fusion syndrome; Failed back surgical syndrome; Chronic knee pain  (Bilateral) (R>L); Major depressive disorder, recurrent episode, moderate ([place]); Musculoskeletal pain; 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 His primarily concern today is the Back Pain (low, mid and upper) and Neck Pain  Pain Assessment: Location: Upper, Mid, Lower Back Radiating: legs and feet Onset: More than a month ago Duration: Chronic pain Quality: Constant, Aching, Burning, Sharp, Shooting Severity: 8 /10 (subjective, self-reported pain score)  Note: Reported level is compatible with observation. Clinically the patient looks like a 3/10 A 3/10 is viewed as "Moderate" and described as significantly interfering with activities of daily living (ADL). It becomes difficult to feed, bathe, get dressed, get on and off the toilet or to perform personal hygiene functions. Difficult to get in and out of bed or a chair without assistance. Very distracting. With effort, it can be ignored when deeply involved in activities. Information on the proper use of the pain scale provided to the patient today. When using our objective Pain Scale, levels between 6 and 10/10 are said to belong in an emergency room, as it progressively worsens from a 6/10, described as severely limiting, requiring emergency care not usually available at an outpatient pain management facility. At a 6/10 level, communication becomes difficult and requires great effort. Assistance to reach the emergency department may be required. Facial flushing and profuse sweating along with potentially dangerous increases in heart rate and blood pressure will be evident. Effect on ADL:   Timing: Constant Modifying factors: Gabapentin,rest BP: (!) 165/108  HR: 79  Mark [name] was last scheduled for an appointment on 12/15/2017 for medication  management. During today's appointment we reviewed Mark [name] chronic pain status, as well as his outpatient medication regimen.  He admits that he has not been able to do much since he is no longer taking the tramadol.  His tramadol was discontinued after he went to his primary care provider and admits that the tramadol was not helping his pain and was then started on Tylenol 3.  After completing the prescriptions he then returned to have the tramadol prescribed again.  He was aware that this was a violation of his pain management contract. He also admits that the continues to decrease the amount of Gabapentin. He feels likes this affects thinking. He had cut the '800mg'$  tabs in half. He would like to now go to '300mg'$  TID. He admits that he has occasional nausea with his medication. He uses Promethazine occasionally.   The patient  reports no history of drug use. His body mass index is 26.78 kg/m.  Further details on both, my assessment(s), as well as the proposed treatment plan, please see below.  Controlled Substance Pharmacotherapy Assessment REMS (Risk Evaluation and Mitigation Strategy)  Analgesic: None MME/day: 0 mg/day.  Mark Rochester, RN  03/12/2018 11:39 AM  Sign when Signing Visit Safety precautions to be maintained throughout the outpatient stay will include: orient to surroundings, keep bed in low position, maintain call bell within reach at all times, provide assistance with transfer out of bed and ambulation.    Pharmacokinetics: Liberation and absorption (onset of action): WNL Distribution (time to peak effect): WNL Metabolism and excretion (duration of action): WNL         Pharmacodynamics: Desired effects: Analgesia: [name] reports >50% benefit. Functional ability: Patient reports that medication allows him to accomplish basic ADLs Clinically meaningful improvement in function (CMIF): Sustained CMIF goals met Perceived effectiveness: Described as relatively  effective, allowing for increase in activities of daily living (ADL) Undesirable effects: Side-effects or Adverse reactions: None reported Monitoring: [place] PMP: Online review of the past 42-monthperiod conducted. Compliant with practice rules and regulations Last UDS on record: Summary  Date Value Ref Range Status  06/19/2017 FINAL  Final    Comment:    ==================================================================== TOXASSURE SELECT 13 (MW) ==================================================================== Test                             Result       Flag       Units Drug Present and Declared for Prescription Verification   Tramadol                       283          EXPECTED   ng/mg creat   O-Desmethyltramadol            140          EXPECTED   ng/mg creat   N-Desmethyltramadol            167          EXPECTED   ng/mg creat    Source of tramadol is a prescription medication.    O-desmethyltramadol and N-desmethyltramadol are expected    metabolites of tramadol. Drug Present not Declared for Prescription Verification   Alprazolam                     101          UNEXPECTED ng/mg creat   Alpha-hydroxyalprazolam        164          UNEXPECTED ng/mg creat    Source of alprazolam is a scheduled prescription medication.    Alpha-hydroxyalprazolam is an expected metabolite of  alprazolam. ==================================================================== Test                      Result    Flag   Units      Ref Range   Creatinine              371              mg/dL      >=20 ==================================================================== Declared Medications:  The flagging and interpretation on this report are based on the  following declared medications.  Unexpected results may arise from  inaccuracies in the declared medications.  **Note: The testing scope of this panel includes these medications:  Tramadol  **Note: The testing scope of this panel does not include  following  reported medications:  Albuterol  Atorvastatin  Cyclobenzaprine  Gabapentin  Losartan (Losartan Potassium)  Metoprolol  Tadalafil  Tamsulosin  Trazodone ==================================================================== For clinical consultation, please call [phone]. ====================================================================    UDS interpretation: Non-Compliant Patient informed of the CDC guidelines and recommendations to stay away from the concomitant use of benzodiazepines and opioids due to the increased risk of respiratory depression and death. Medication Assessment Form: Discrepancies found between patient's report and information collected Treatment compliance: N/A Risk Assessment Profile: Aberrant behavior: See prior evaluations. None observed or detected today Comorbid factors increasing risk of overdose: age 50-66 years old, caucasian, history of substance use disorder and male gender Opioid risk tool (ORT) (Total Score): 9 Personal History of Substance Abuse (SUD-Substance use disorder):  Alcohol: Positive Male or Male  Illegal Drugs: Negative  Rx Drugs: Negative  ORT Risk Level calculation: High Risk Risk of substance use disorder (SUD): High-to-Very High Opioid Risk Tool - 03/12/18 1134      Family History of Substance Abuse   Alcohol  Positive Male    Illegal Drugs  Negative    Rx Drugs  Negative      Personal History of Substance Abuse   Alcohol  Positive Male or Male    Illegal Drugs  Negative    Rx Drugs  Negative      Age   Age between 87-45 years   No      History of Preadolescent Sexual Abuse   History of Preadolescent Sexual Abuse  Negative or Male      Psychological Disease   Psychological Disease  Positive    Bipolar  Negative    Depression  Positive   states with anxiety     Total Score   Opioid Risk Tool Scoring  9    Opioid Risk Interpretation  High Risk      ORT Scoring interpretation table:  Score  <3 = Low Risk for SUD  Score between 4-7 = Moderate Risk for SUD  Score >8 = High Risk for Opioid Abuse   Risk Mitigation Strategies:  Patient Counseling: Covered Patient-Prescriber Agreement (PPA): Present and active  Notification to other healthcare providers: Done  Pharmacologic Plan: For safety reasons, the treatment plan will be modified to exclude opioids.             Laboratory Chemistry  Inflammation Markers (CRP: Acute Phase) (ESR: Chronic Phase) Lab Results  Component Value Date   CRP 0.9 03/18/2016   ESRSEDRATE 2 03/18/2016   LATICACIDVEN 1.16 02/06/2015                         Rheumatology Markers Lab Results  Component Value Date   LABURIC 5.7  12/09/2011                        Renal Function Markers Lab Results  Component Value Date   BUN 6 01/03/2018   CREATININE 0.65 01/03/2018   BCR 12 04/09/2017   GFRAA >60 01/03/2018   GFRNONAA >60 01/03/2018                             Hepatic Function Markers Lab Results  Component Value Date   AST 43 (H) 01/03/2018   ALT 36 01/03/2018   ALBUMIN 3.8 01/03/2018   ALKPHOS 98 01/03/2018   HCVAB NEGATIVE 06/14/2012   AMYLASE 26 12/28/2012   LIPASE 38 01/03/2018   AMMONIA 27 06/27/2010                        Electrolytes Lab Results  Component Value Date   NA 139 01/03/2018   K 3.8 01/03/2018   CL 102 01/03/2018   CALCIUM 8.7 (L) 01/03/2018   MG 2.5 (H) 01/01/2018   PHOS 3.1 12/31/2017                        Neuropathy Markers Lab Results  Component Value Date   VITAMINB12 244 03/18/2016   FOLATE 3.0 (L) 06/07/2013   HGBA1C 6.0 04/03/2015   HIV Non Reactive 12/31/2017                        CNS Tests No results found for: COLORCSF, APPEARCSF, RBCCOUNTCSF, WBCCSF, POLYSCSF, LYMPHSCSF, EOSCSF, PROTEINCSF, GLUCCSF, JCVIRUS, CSFOLI, IGGCSF                      Bone Pathology Markers Lab Results  Component Value Date   VD25OH 21 (L) 07/04/2014   25OHVITD1 35 03/18/2016   25OHVITD2 1.0 03/18/2016    25OHVITD3 34 03/18/2016   TESTOFREE 78.9 09/16/2014   TESTOSTERONE 365 09/16/2014                         Coagulation Parameters Lab Results  Component Value Date   INR 1.04 06/07/2013   LABPROT 13.4 06/07/2013   PLT 271 01/03/2018   DDIMER <0.27 11/13/2015                        Cardiovascular Markers Lab Results  Component Value Date   BNP 23.9 02/27/2015   CKTOTAL 63 12/31/2017   CKMB 2.5 01/22/2012   TROPONINI <0.03 12/31/2017   HGB 15.0 01/03/2018   HCT 44.6 01/03/2018                         CA Markers No results found for: CEA, CA125, LABCA2                      Note: Lab results reviewed.  Recent Diagnostic Imaging Results  US Abdomen Limited RUQ CLINICAL DATA:  Abdominal pain.  EXAM: ULTRASOUND ABDOMEN LIMITED RIGHT UPPER QUADRANT  COMPARISON:  Abdomen and pelvis CT dated 09/08/2017.  FINDINGS: Gallbladder:  Poorly distended gallbladder with associated mild diffuse wall thickening. No gallstones or pericholecystic fluid. No sonographic Murphy sign.  Common bile duct:  Diameter: 4.9 mm  Liver:  No focal lesion identified. Within normal limits in parenchymal echogenicity.  Portal vein is patent on color Doppler imaging with normal direction of blood flow towards the liver.  IMPRESSION: Normal examination. Normal mild gallbladder wall thickening due to poor distention of the gallbladder.  Electronically Signed   By: Claudie Revering M.D.   On: 01/03/2018 16:09  Complexity Note: Imaging results reviewed. Results shared with Mark [name], using Layman's terms.                         Meds   Current Outpatient Medications:  .  albuterol (VENTOLIN HFA) 108 (90 Base) MCG/ACT inhaler, Inhale 2 puffs into the lungs every 6 (six) hours as needed for wheezing or shortness of breath., Disp: 18 g, Rfl: 3 .  cyclobenzaprine (FLEXERIL) 10 MG tablet, Take 1 tablet (10 mg total) by mouth at bedtime., Disp: 30 tablet, Rfl: 2 .  famotidine (PEPCID AC) 10 MG  chewable tablet, Chew 10 mg by mouth as needed for heartburn., Disp: , Rfl:  .  metoprolol (LOPRESSOR) 50 MG tablet, Take 1 tablet (50 mg total) by mouth 2 (two) times daily. (Patient taking differently: Take 50 mg by mouth 3 (three) times daily. ), Disp: 60 tablet, Rfl: 11 .  nicotine (NICODERM CQ - DOSED IN MG/24 HOURS) 21 mg/24hr patch, Place 1 patch (21 mg total) onto the skin daily., Disp: 28 patch, Rfl: 0 .  omeprazole (PRILOSEC OTC) 20 MG tablet, Take 20 mg by mouth 2 (two) times daily., Disp: , Rfl:  .  traZODone (DESYREL) 100 MG tablet, Take 1 tablet (100 mg total) by mouth at bedtime as needed. (Patient taking differently: Take 50-100 mg by mouth at bedtime as needed for sleep. ), Disp: 30 tablet, Rfl: 3 .  gabapentin (NEURONTIN) 300 MG capsule, Take 1 capsule (300 mg total) by mouth every 8 (eight) hours., Disp: 90 capsule, Rfl: 2 .  nicotine (NICODERM CQ - DOSED IN MG/24 HOURS) 14 mg/24hr patch, Place 1 patch (14 mg total) onto the skin daily. (Patient not taking: Reported on 01/03/2018), Disp: 28 patch, Rfl: 0  ROS  Constitutional: Denies any fever or chills Gastrointestinal: No reported hemesis, hematochezia, vomiting, or acute GI distress Musculoskeletal: Denies any acute onset joint swelling, redness, loss of ROM, or weakness Neurological: No reported episodes of acute onset apraxia, aphasia, dysarthria, agnosia, amnesia, paralysis, loss of coordination, or loss of consciousness  Allergies  Mark [name] is allergic to cozaar [losartan] and lisinopril.  [place]  Drug: Mark [name]  reports no history of drug use. Alcohol:  reports current alcohol use. Tobacco:  reports that he has been smoking cigarettes. He has a 15.00 pack-year smoking history. He has never used smokeless tobacco. Medical:  has a past medical history of Alcohol withdrawal ([place]), Allergy, Anxiety (Dx 2014), COPD (chronic obstructive pulmonary disease) ([place]), Drug abuse ([place]), Enlarged prostate, ETOH abuse, GERD  (gastroesophageal reflux disease) (Dx 2003), Headache(784.0), Hepatitis, Hyperlipidemia (Dx 2000), Hypertension (Dx 2011), IBS (irritable bowel syndrome), Irregular heart beat, Long Q-T syndrome (01/23/2014), Mental disorder, Neuromuscular disorder ([place]), Neuropathy (06/07/2013), Seizure due to alcohol withdrawal ([place]) (01/23/2014), Shortness of breath, and Withdrawal seizures ([place]) (2014). Surgical: Mark [name]  has a past surgical history that includes Back surgery (1995, 1999); Neck surgery (2000); Nasal sinus surgery; left heart catheterization with coronary angiogram (N/A, 01/13/2014); Subacromial decompression (Right, 11/16/2014); Nissen fundoplication; 24 hour ph study (N/A, 01/16/2015); Upper gastrointestinal endoscopy; Colonoscopy (1998); and Shoulder surgery (Right). Family: family history includes Alcohol abuse in his father and paternal grandfather; Breast cancer in  his paternal grandmother; Colon polyps in his mother; Hypertension in his mother; Lung cancer in his father.  Constitutional Exam  General appearance: Well nourished, well developed, and well hydrated. In no apparent acute distress Vitals:   03/12/18 1125  BP: (!) 165/108  Pulse: 79  Resp: 18  Temp: 98.3 F (36.8 C)  TempSrc: Oral  SpO2: 100%  Weight: 220 lb (99.8 kg)  Height: '6\' 4"'$  (1.93 m)  Psych/Mental status: Alert, oriented x 3 (person, place, & time)       Eyes: PERLA Respiratory: No evidence of acute respiratory distress  Cervical Spine Area Exam  Skin & Axial Inspection: No masses, redness, edema, swelling, or associated skin lesions Alignment: Symmetrical Functional ROM: Unrestricted ROM      Stability: No instability detected Muscle Tone/Strength: Functionally intact. No obvious neuro-muscular anomalies detected. Sensory (Neurological): Unimpaired Palpation: No palpable anomalies              Thoracic Spine Area Exam  Skin & Axial Inspection: No masses, redness, or swelling Alignment:  Symmetrical Functional ROM: Unrestricted ROM Stability: No instability detected Muscle Tone/Strength: Functionally intact. No obvious neuro-muscular anomalies detected. Sensory (Neurological): Unimpaired Muscle strength & Tone: No palpable anomalies  Lumbar Spine Area Exam  Skin & Axial Inspection: No masses, redness, or swelling Alignment: Symmetrical Functional ROM: Unrestricted ROM       Stability: No instability detected Muscle Tone/Strength: Functionally intact. No obvious neuro-muscular anomalies detected. Sensory (Neurological): Unimpaired Palpation: No palpable anomalies         Gait & Posture Assessment  Ambulation: Unassisted Gait: Relatively normal for age and body habitus Posture: WNL   Lower Extremity Exam    Side: Right lower extremity  Side: Left lower extremity  Stability: No instability observed          Stability: No instability observed          Skin & Extremity Inspection: Edema  Skin & Extremity Inspection: Edema  Functional ROM: Unrestricted ROM                  Functional ROM: Unrestricted ROM                  Muscle Tone/Strength: Functionally intact. No obvious neuro-muscular anomalies detected.  Muscle Tone/Strength: Functionally intact. No obvious neuro-muscular anomalies detected.  Sensory (Neurological): Paresthesia (Burning sensation)        Sensory (Neurological): Paresthesia (Burning sensation)            Palpation: No palpable anomalies  Palpation: No palpable anomalies   Assessment  Primary Diagnosis & Pertinent Problem List: The primary encounter diagnosis was Lumbar facet syndrome (Bilateral) (R>L). Diagnoses of Neuropathy, Peripheral (Feet) ([name]) (Location of Primary Source of Pain) (Bilateral) (R>L), Musculoskeletal pain, Chronic pain syndrome, and Nausea in adult were also pertinent to this visit.  Status Diagnosis  Persistent Persistent Persistent 1. Lumbar facet syndrome (Bilateral) (R>L)   2. Neuropathy, Peripheral (Feet) ([name])  (Location of Primary Source of Pain) (Bilateral) (R>L)   3. Musculoskeletal pain   4. Chronic pain syndrome   5. Nausea in adult     Problems updated and reviewed during this visit: No problems updated. Plan of Care  Pharmacotherapy (Medications Ordered): Meds ordered this encounter  Medications  . gabapentin (NEURONTIN) 300 MG capsule    Sig: Take 1 capsule (300 mg total) by mouth every 8 (eight) hours.    Dispense:  90 capsule    Refill:  2    Do not place this medication,  or any other prescription from our practice, on "Automatic Refill". Patient may have prescription filled one day early if pharmacy is closed on scheduled refill date.    Order Specific Question:   Supervising Provider    Answer:   Milinda Pointer [phone]  . cyclobenzaprine (FLEXERIL) 10 MG tablet    Sig: Take 1 tablet (10 mg total) by mouth at bedtime.    Dispense:  30 tablet    Refill:  2    Do not place medication on "Automatic Refill". Fill one day early if pharmacy is closed on scheduled refill date.    Order Specific Question:   Supervising Provider    Answer:   Milinda Pointer [phone]   New Prescriptions   GABAPENTIN (NEURONTIN) 300 MG CAPSULE    Take 1 capsule (300 mg total) by mouth every 8 (eight) hours.   Medications administered today: [name] had no medications administered during this visit. Lab-work, procedure(s), and/or referral(s): No orders of the defined types were placed in this encounter.  Imaging and/or referral(s): None  Interventional therapies: Planned, scheduled, and/or pending:  Not at this time   Considering:  Lower extremity EMG/PNCV. (Radiculopathy versus peripheral neuropathy) Upper extremity EMG/PNCV.  Possibleright-sided L3 + L4 Lumbarsympathetic RFA Possible Racz procedure Diagnostic cervical epidural steroidinjection  Diagnostic bilateral cervical facet block  Possible bilateral cervical facet RFA Diagnostic bilateral lumbar facet  block Possible bilateral lumbar facet RFA Diagnostic right intra-articular shoulder joint injection Diagnostic right suprascapular nerveblock Possibleright suprascapular RFA Diagnostic bilateral intra-articular knee joint injection Possible bilateral series of 5 Hyalgan knee injections Diagnostic bilateral Genicular nerve block Possible bilateral Genicular RFA Diagnostic bilateral greater occipital nerve block Possible bilateral greater occipital nerve RFA Diagnostic bilateral C2 +TON nerve block Possible bilateral C2 + TON RFA   Palliative PRN treatment(s):  Diagnostic Caudal ESI#3 Diagnostic right-sided L3 + L4 lumbar sympathetic block#3     Provider-requested follow-up: Return in about 3 months (around 06/11/2018) for MedMgmt, w/ Dr. Dossie Arbour.  Future Appointments  Date Time Provider [place]  03/23/2018  7:45 AM Milinda Pointer, MD [facility] None   Primary Care Physician: Ladell Pier, MD Location: [facility] Outpatient Pain Management Facility Note by: Vevelyn Francois NP Date: 03/12/2018; Time: 1:38 PM  Pain Score Disclaimer: We use the NRS-11 scale. This is a self-reported, subjective measurement of pain severity with only modest accuracy. It is used primarily to identify changes within a particular patient. It must be understood that outpatient pain scales are significantly less accurate that those used for research, where they can be applied under ideal controlled circumstances with minimal exposure to variables. In reality, the score is likely to be a combination of pain intensity and pain affect, where pain affect describes the degree of emotional arousal or changes in action readiness caused by the sensory experience of pain. Factors such as social and work situation, setting, emotional state, anxiety levels, expectation, and prior pain experience may influence pain perception and show large inter-individual differences that may also be affected by  time variables.  Patient instructions provided during this appointment: Patient Instructions  ____________________________________________________________________________________________  Appointment Policy Summary  It is our goal and responsibility to provide the medical community with assistance in the evaluation and management of patients with chronic pain. Unfortunately our resources are limited. Because we do not have an unlimited amount of time, or available appointments, we are required to closely monitor and manage their use. The following rules exist to maximize their use:  Patient's responsibilities: 1. Punctuality:  At what time should I arrive? You should be physically present in our office 30 minutes before your scheduled appointment. Your scheduled appointment is with your assigned healthcare provider. However, it takes 5-10 minutes to be "checked-in", and another 15 minutes for the nurses to do the admission. If you arrive to our office at the time you were given for your appointment, you will end up being at least 20-25 minutes late to your appointment with the provider. 2. Tardiness:  What happens if I arrive only a few minutes after my scheduled appointment time? You will need to reschedule your appointment. The cutoff is your appointment time. This is why it is so important that you arrive at least 30 minutes before that appointment. If you have an appointment scheduled for 10:00 AM and you arrive at 10:01, you will be required to reschedule your appointment.  3. Plan ahead:  Always assume that you will encounter traffic on your way in. Plan for it. If you are dependent on a driver, make sure they understand these rules and the need to arrive early. 4. Other appointments and responsibilities:  Avoid scheduling any other appointments before or after your pain clinic appointments.  5. Be prepared:  Write down everything that you need to discuss with your healthcare provider and  give this information to the admitting nurse. Write down the medications that you will need refilled. Bring your pills and bottles (even the empty ones), to all of your appointments, except for those where a procedure is scheduled. 6. No children or pets:  Find someone to take care of them. It is not appropriate to bring them in. 7. Scheduling changes:  We request "advanced notification" of any changes or cancellations. 8. Advanced notification:  Defined as a time period of more than 24 hours prior to the originally scheduled appointment. This allows for the appointment to be offered to other patients. 9. Rescheduling:  When a visit is rescheduled, it will require the cancellation of the original appointment. For this reason they both fall within the category of "Cancellations".  10. Cancellations:  They require advanced notification. Any cancellation less than 24 hours before the  appointment will be recorded as a "No Show". 11. No Show:  Defined as an unkept appointment where the patient failed to notify or declare to the practice their intention or inability to keep the appointment.  Corrective process for repeat offenders:  1. Tardiness: Three (3) episodes of rescheduling due to late arrivals will be recorded as one (1) "No Show". 2. Cancellation or reschedule: Three (3) cancellations or rescheduling will be recorded as one (1) "No Show". 3. "No Shows": Three (3) "No Shows" within a 12 month period will result in discharge from the practice. ____________________________________________________________________________________________  ALL PRESCRIPTIONS WERE E-SCRIBED TO YOUR PHARMACY OF CHOICE.

## 2018-03-17 MED FILL — METOPROLOL TARTRATE 50 MG T: 50 | 30 days supply | Qty: 90 | Fill #3

## 2018-03-22 NOTE — Progress Notes (Signed)
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   Electronically Signed   By: Richardean Sale M.D.   On: 01/23/2015 15:55    Shoulder-R DG:  Results for orders placed during the hospital encounter of 03/18/16  DG Shoulder Right   Narrative CLINICAL DATA:  Injury.  Pain.  EXAM: RIGHT SHOULDER - 2+ VIEW  COMPARISON:  11/07/2015.  01/23/2015.  MRI 08/26/2014 .  FINDINGS: Acromioclavicular and glenohumeral degenerative change. Corticated lucency is again in the proximal right humerus again noted consistent postsurgical change . Exam is stable from prior MRI 08/26/2014 . No acute bony abnormality identified.  IMPRESSION: Acromioclavicular and glenohumeral degenerative change with postsurgical  changes proximal humerus. No acute abnormality identified.   Electronically Signed   By: Marcello Moores  Register   On: 03/18/2016 15:11    Lumbosacral Imaging: Lumbar MR wo  contrast:  Results for orders placed during the hospital encounter of 05/22/17  MR LUMBAR SPINE WO CONTRAST   Narrative CLINICAL DATA:  Chronic bilateral low back pain with bilateral sciatica. Neuropathy. Prior back surgery.  EXAM: MRI LUMBAR SPINE WITHOUT CONTRAST  TECHNIQUE: Multiplanar, multisequence MR imaging of the lumbar spine was performed. No intravenous contrast was administered.  COMPARISON:  Lumbar MRI 04/24/2016  FINDINGS: Segmentation:  Normal  Alignment: Mild retrolisthesis L4-5 unchanged. Slight retrolisthesis L2-3 and L3-4 unchanged.  Vertebrae:  Normal bone marrow.  Negative for fracture or mass.  Conus medullaris and cauda equina: Conus extends to the L2 level. Conus and cauda equina appear normal.  Paraspinal and other soft tissues: Negative for mass or adenopathy or fluid collection.  Disc levels:  L1-2: Mild disc degeneration  L2-3: Mild retrolisthesis with mild facet degeneration. Negative for stenosis  L3-4: Mild retrolisthesis with mild facet degeneration. Negative for stenosis  L4-5: Mild retrolisthesis. Mild disc bulging and spurring. Mild facet hypertrophy. Mild narrowing of the canal similar to the prior study. Mild subarticular stenosis bilaterally  L5-S1: Bilateral threaded interbody cage fusion. Posterior decompression. Negative for stenosis. No change from the prior study.  IMPRESSION: No change from the prior MRI 04/24/2016  Mild disc and facet degeneration L2-3, L3-4, L4-5. Mild narrowing of the spinal canal at L4-5 and mild subarticular stenosis bilaterally  Bilateral threaded interbody cage fusion L5-S1 unchanged, without stenosis.   Electronically Signed   By: Franchot Gallo M.D.   On: 05/23/2017 07:15    Lumbar MR w/wo contrast:  Results for orders placed during the hospital encounter of 04/24/16  MR LUMBAR SPINE W WO CONTRAST   Narrative CLINICAL DATA:  bilateral low back pain and sciatica  EXAM: MRI LUMBAR  SPINE WITHOUT AND WITH CONTRAST  TECHNIQUE: Multiplanar and multiecho pulse sequences of the lumbar spine were obtained without and with intravenous contrast.  CONTRAST:  65m MULTIHANCE GADOBENATE DIMEGLUMINE 529 MG/ML IV SOLN  COMPARISON:  Lumbar spine radiograph 03/18/2016  FINDINGS: Segmentation: Normal. The lowest disc space is considered to be L5-S1.  Alignment:  Normal  Vertebrae: L5-S1 hardware is again noted. No acute compression fracture, discitis-osteomyelitis, facet edema or other focal marrow lesion. No epidural collection.  Conus medullaris: Extends to the L1 level and appears normal.  Paraspinal and other soft tissues: The visualized aorta, IVC and iliac vessels are normal. The visualized retroperitoneal organs and paraspinal soft tissues are normal.  Disc levels:  T12-L1: Normal disc space and facets. No spinal canal or neuroforaminal stenosis.  L1-L2: Normal disc space and facets. No spinal canal or neuroforaminal stenosis.  L2-L3: Normal disc space and facets. No spinal canal or neuroforaminal stenosis.  L3-L4: Normal disc space and facets. No spinal canal or neuroforaminal stenosis.  L4-L5: Normal disc space and facets. No spinal canal or neuroforaminal stenosis.  L5-S1: Postsurgical changes. Spinal canal is widely patent. There is a small amount of granulation tissue extending into the left neural foramen. No neural foraminal stenosis.  IMPRESSION: Unchanged normal alignment of fused L5-S1 level. Small amount of granulation tissue extends into the left L5 neural foramen without causing clear impingement. Otherwise, there is no spinal canal or neural foraminal stenosis.   Electronically Signed   By: KUlyses JarredM.D.   On: 04/24/2016 15:28    Lumbar CT wo contrast:  Results for orders placed in visit on 01/11/99  CT Lumbar Spine Wo Contrast   Narrative FINDINGS CLINICAL DATA:  BACK AND LEG PAIN, RIGHT WORSE THAN LEFT. ABNORMAL DISC AT  L5-S1. MILDLY ABNORMAL DISK AT L4-5. DIAGNOSTIC LUMBAR DISC INJECTION L4-5: AN OBLIQUE APPROACH WAS TAKEN USING A CURVED 22 GAUGE CHIBA NEEDLE. THE OVERLYING SKIN WAS SCRUBBED WITH BETADINE AND STERILELY DRAPED. SKIN ANESTHESIA WAS CARRIED OUT USING 1% LIDOCAINE. THE NEEDLE WAS DIRECTED INTO THE NUCLEAR REGION OF THE [place]. CONTRAST MIXED WITH DILUTE ANCEF WAS INJECTED DURING FLUOROSCOPIC OBSERVATION AND PRESSURE MONITORING. OPENING PRESSURE WAS 40 PSI. THE DISC ACCOMMODATED ONLY 2 CC OF CONTRAST WITH ELEVATION OF INTRADISCAL PRESSURE TO 100 PSI. THERE IS A NORMAL NUCLEAR PATTERN. THE PATIENT FELT SOME MILD BACK PRESSURE BUT DID NOT FEEL HIS FAMILIAR PAIN. DIAGNOSTIC DISC INJECTION L5-S1: AN OBLIQUE APPROACH WAS TAKEN USING A CURVED 22 GAUGE CHIBA NEEDLE. THIS WAS POSITIONED IN THE NUCLEUS OF THE [place]. CONTRAST MIXED WITH DILUTE ANCEF WAS INJECTED DURING DIRECT OBSERVATION AND PRESSURE MONITORING. OPENING PRESSURE WAS 10 PSI. THE DISC ACCOMMODATED 3 CC OF CONTRAST WITH ELEVATION OF INTRADISCAL PRESSURE ONLY TO 40 PSI. THE PATIENT REPORTED REPRODUCTION OF HIS FAMILIAR HOME PAIN AFFECTING HIS LOW BACK.  THERE WAS SOME RADICULAR SYMPTOMATOLOGY TOWARDS THE LEFT. IMAGING SHOWS AN ABNORMAL PATTERN OF CONTRAST EXTENSION WITH THE CONTRAST PERVADING THE NUCLEAR REGION AND EXTENDING INTO ANNULAR REGIONS, PARTICULARLY IN THE LEFT POSTEROLATERAL DIRECTION. IMPRESSION   Lumbar CT w contrast:  Results for orders placed in visit on 04/03/01  CT Lumbar Spine W Contrast   Narrative FINDINGS CLINICAL DATA:  LOW BACK PAIN.  PREVIOUS LUMBAR FUSION L5-S1. LUMBAR DISKOGRAM RAY CAGE INJECTION CT LUMBAR SPINE WITH INTRADISKAL CONTRAST TECHNIQUE: LUMBAR REGION PREPPED WITH BETADINE AND DRAPED USING STRICT ASEPTIC TECHNIQUE.  INTRAVENOUS VERSED WAS ADMINISTERED AS ANXIOLYTIC.   AFTER THE SKIN WAS INFILTRATED WITH BUFFERED LIDOCAINE, A CURVED 15 CM 22 GAUGE CHIBA NEEDLE WAS ADVANCED INTO THE L4-5 INTERSPACE FROM  A LEFT POSTEROLATERAL APPROACH.  ONCE NEEDLE TIP POSITION WAS CONFIRMED ON AP AND LATERAL FLUOROSCOPY, THE INTERSPACE WAS INJECTED WITH WATER SOLUBLE CONTRAST DURING CONTINUOUS FLUOROSCOPIC AND PRESSURE MONITORING.  THE NEEDLE WAS THEN REMOVED.   INTRAVENOUS FENTANYL AND VERSED WERE ADMINISTERED FOR CONSCIOUS SEDATION DURING CONTINUOUS RESPIRATORY MONITORING BY RADIOLOGY R.N.    THE SKIN WAS FURTHER ANESTHETIZED WITH BUFFERED LIDOCAINE AND 20 CM, 22 GAUGE CHIBA NEEDLE ADVANCED TO THE MARGIN OF THE LEFT RAY CAGE AT THE L5-S1 INTERSPACE USING A LEFT POSTEROLATERAL APPROACH.  UNDER FLUOROSCOPY, THE NEEDLE WAS INJECTED.   IN A SIMILAR FASHION, A SECOND 20 CM 22 GAUGE CHIBA NEEDLE WAS ADVANCED TO THE MARGIN OF THE RIGHT CAGE AT L5-S1 AND THE NEEDLE INJECTED UNDER FLUOROSCOPY.   THE PATIENT WAS THEN TRANSFERRED TO CT FOR AXIAL SCANNING THROUGH THESE LEVELS.   NO IMMEDIATE COMPLICATION.   THE PATIENT WAS COOPERATIVE THROUGHOUT THE EXAMINATION, ALTHOUGH MODERATELY HYPERESTHETIC, AND SEEMED RELIABLE. FINDINGS: L4-5:  OPENING PRESSURE 25 PSI.   PRESSURE AT PAIN RESPONSE 50 PSI AFTER 1 ML OF CONTRAST.   THE PATIENT DESCRIBES PAIN AND PRESSURE IN HIS LOW BACK AND BUTTOCK AREA WITHOUT RADICULAR COMPONENT RATED 5 ON A PAIN SCALE OF 1 TO 10.  DISKOGRAPHY DEMONSTRATES NORMAL NUCLEAR SPREAD OF CONTRAST. CT DISKOGRAPHY DEMONSTRATES NORMAL NUCLEAR SPREAD OF CONTRAST WITHOUT ANY ANNULAR DEFECT.  THERE IS VERY MILD POSTERIOR DISK BULGE NOTED AS WELL AS EARLY BILATERAL FACET HYPERTROPHY. L5-S1:  INJECTION OF THE RAY CAGE ON THE LEFT IS NORMAL WITH NO  CONTRAST FLOWING CENTRALLY AND A SMALL AMOUNT OF CONTRAST EXTRAVASATION PERIPHERALLY, OBTAINING INJECTION PRESSURES UP TO 100 PSI. INJECTION ON THE RIGHT DID RESULT IN CONTRAST FLOWING WITHIN THE INTERSTICES OF THE RAY CAGE AND ALSO MORE CENTRALLY WITHIN THE INTERSPACE.  THIS WAS CONFIRMED ON LATERAL IMAGING TO REPRESENT CONTRAST WITHIN THE INTERSPACE AND NOT FLOWING IN  THE EPIDURAL SPACE ANTERIOR OR POSTERIOR TO THE DISK.   CT THROUGH THIS LEVEL DEMONSTRATES THE TWO RAY CAGES PROJECTING IN EXPECTED LOCATION WITH THE LEFT SIDED CAGE PROJECTING JUST ANTERIOR TO THE ANTERIOR MARGIN OF THE SACRAL CORTEX.  THERE IS NO EVIDENCE OF LUCENCY AROUND THE HARDWARE. IMPRESSION 1)     MORPHOLOGY NORMAL DISKOGRAPHY AT L4-5 ALTHOUGH THERE WAS A CONCORDANT PAIN RESPONSE AS DETAILED ABOVE. 2)    ABNORMAL RAY CAGE INJECTION AT L5-S1 MANIFEST BY CONTRAST ENTERING THE INTERSTICES OF THE RIGHT SIDED STENT AND ENTERING THE INTERSPACE FROM THE RIGHT SIDED INJECTION.   Lumbar DG (Complete) 4+V:  Results for orders placed in visit on 12/25/00  DG Lumbar Spine Complete   Narrative FINDINGS CLINICAL DATA:  CERVICAL SPONDYLOSIS. LOW BACK PAIN AND BILATERAL LEG PAIN.  CHRONIC HEADACHES. CERVICAL SPINE THREE VIEWS LATERAL FILMS WERE OBTAINED IN THE NEUTRAL, FLEXION AND EXTENSION POSITIONS.  THERE IS AN ANTERIOR PLATE FUSING C5, C6 AND C7.  INTERBODY BONE PLUGS ARE PRESENT AT C5-6 AND C6-7 WHICH DO NOT YET APPEAR FULLY INCORPORATED.  THE ALIGNMENT IS NORMAL.  THE REMAINDER OF THE DISC SPACES ARE INTACT. THERE IS NO FRACTURE. IMPRESSION ANTERIOR PLATE FUSION C5, C6, AND C7.  BONY FUSION DOES NOT YET APPEAR COMPLETE AS THERE IS A THIN RADIOLUCENCY BETWEEN THE END PLATES AND THE BONE PLUG AT BOTH LEVELS. MRI CERVICAL SPINE WITHOUT AND WITH CONTRAST COMPARISON WITH MRI FROM 05/20/99. THE ALIGNMENT IS NORMAL.  THE CORD HAS NORMAL SIGNAL.  THERE IS AN ANTERIOR PLATE FUSION OF C5, C6 AND C7.  THE ENHANCEMENT PATTERN IS WITHIN NORMAL LIMITS AND THERE IS NO EVIDENCE OF INFECTION OR TUMOR. C2-3:  NEGATIVE. C3-4:  NEGATIVE. C4-5:  MILD DISC DEGENERATION AND DISC BULGING.  THERE IS MILD FACET ARTHROPATHY WITHOUT SIGNIFICANT SPINAL STENOSIS. C5-6:  THERE IS ARTIFACT FROM THE PRIOR SURGICAL FUSION.  THERE DOES APPEAR TO BE SOME RESIDUAL MILD SPONDYLOSIS.  THE BONE PLUG IS POSITIONED SLIGHTLY  POSTERIOR TO THE VERTEBRAL BODY MARGIN. THIS IS CAUSING MILD SPINAL STENOSIS.  MILD FACET ARTHROPATHY. C6-7:  THERE IS MILD SPINAL STENOSIS DUE TO SPONDYLOSIS AND THERE ARE BIFORAMINAL NARROWING.  THERE IS MILD FACET ARTHROPATHY. C7-T1:  SMALL CENTRAL AND LEFT-SIDED DISC HERNIATION IS SEEN WITH SLIGHT FLATTENING OF THE LEFT SIDE OF THE CORD. 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 BROAD BASED SHALLOW CENTRAL AND LEFT-SIDED DISC HERNIATION C7-T1. LUMBAR SPINE FOUR VIEWS THERE ARE FIVE LUMBAR SEGMENTS.  BILATERAL RAY CAGES AT L5-S1 ARE IN GOOD POSITION.  THERE IS MILD DISC SPACE NARROWING AND SPURRING AT L4-5.  THERE IS NO PARS DEFECT OR FRACTURE. 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 SATISFACTORY RAY CAGE FUSION AT L5-S1. MRI LUMBAR SPINE WITHOUT AND WITH CONTRAST THE LUMBAR ALIGNMENT IS NORMAL.  NO FRACTURE IS SEEN.  THERE IS SOME BONE MARROW ENHANCEMENT AT L5- S1 RELATED TO THE RAY CAGE FUSION.  THE CONUS MEDULLARIS IS INTACT.  THERE IS NO FRACTURE. L1-2:  MILD DISC SPACE NARROWING. L2-3:  MILD DISC SPACE NARROWING. L3-4:  MILD DISC SPACE NARROWING AND MILD FACET ARTHROPATHY WITHOUT SIGNIFICANT SPINAL STENOSIS. L4-5:  MILD DISC SPACE NARROWING AND MILD BULGING.  THERE IS MILD TO MODERATE FACET ARTHROPATHY BUT THERE IS NO SIGNIFICANT SPINAL STENOSIS. L5-S1:  BILATERAL RAY CAGE FUSION HAS BEEN PERFORMED.  S1 NERVE ROOTS ARE NORMALLY POSITIONED. THERE IS NO STENOSIS. 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  THERE IS NO DISC HERNIATION OR SIGNIFICANT SPINAL STENOSIS.   Lumbar DG Bending views:  Results for orders placed during the hospital encounter of 03/18/16  DG Lumbar Spine Complete W/Bend   Narrative CLINICAL DATA:  Pain.  No injury.  EXAM: LUMBAR SPINE - COMPLETE WITH BENDING VIEWS  COMPARISON:  CT 04/12/2014.  FINDINGS: Prior L5-S1 fusion. Anatomic alignment.  Hardware intact diffuse degenerative change. No acute bony abnormality . Aortoiliac atherosclerotic vascular calcification.  IMPRESSION: 1. Diffuse multilevel degenerative change. L5-S1 fusion. Anatomic alignment.  2. Aortoiliac atherosclerotic vascular disease.   Electronically Signed   By: Marcello Moores  Register   On: 03/18/2016 15:03    Lumbar DG Diskogram views:  Results for orders placed in visit on 04/03/01  DG Diskogram Lumbar   Narrative FINDINGS CLINICAL DATA:  LOW BACK PAIN.  PREVIOUS LUMBAR FUSION L5-S1. LUMBAR DISKOGRAM RAY CAGE INJECTION CT LUMBAR SPINE WITH INTRADISKAL CONTRAST TECHNIQUE: LUMBAR REGION PREPPED WITH BETADINE AND DRAPED USING STRICT ASEPTIC TECHNIQUE.  INTRAVENOUS VERSED WAS ADMINISTERED AS ANXIOLYTIC.   AFTER THE SKIN WAS INFILTRATED WITH BUFFERED LIDOCAINE, A CURVED 15 CM 22 GAUGE CHIBA NEEDLE WAS ADVANCED INTO THE L4-5 INTERSPACE FROM A LEFT POSTEROLATERAL APPROACH.  ONCE NEEDLE TIP POSITION WAS CONFIRMED ON AP AND LATERAL FLUOROSCOPY, THE INTERSPACE WAS INJECTED WITH WATER SOLUBLE CONTRAST DURING CONTINUOUS FLUOROSCOPIC AND PRESSURE MONITORING.  THE NEEDLE WAS THEN REMOVED.   INTRAVENOUS FENTANYL AND VERSED WERE ADMINISTERED FOR CONSCIOUS SEDATION DURING CONTINUOUS RESPIRATORY MONITORING BY RADIOLOGY R.N.    THE SKIN WAS FURTHER ANESTHETIZED WITH BUFFERED LIDOCAINE AND 20 CM, 22 GAUGE CHIBA NEEDLE ADVANCED TO THE MARGIN OF THE LEFT RAY CAGE AT THE L5-S1 INTERSPACE USING A LEFT POSTEROLATERAL APPROACH.  UNDER FLUOROSCOPY, THE NEEDLE WAS INJECTED.   IN A SIMILAR FASHION, A SECOND 20 CM 22 GAUGE CHIBA NEEDLE WAS ADVANCED TO THE MARGIN OF THE RIGHT CAGE AT L5-S1 AND THE NEEDLE INJECTED UNDER FLUOROSCOPY.   THE PATIENT WAS THEN TRANSFERRED TO CT FOR AXIAL SCANNING THROUGH THESE LEVELS.   NO IMMEDIATE COMPLICATION.   THE PATIENT WAS COOPERATIVE THROUGHOUT THE EXAMINATION, ALTHOUGH MODERATELY HYPERESTHETIC, AND SEEMED RELIABLE. FINDINGS: L4-5:  OPENING  PRESSURE 25 PSI.   PRESSURE AT PAIN RESPONSE 50 PSI AFTER 1 ML OF CONTRAST.   THE PATIENT DESCRIBES PAIN AND PRESSURE IN HIS LOW BACK AND BUTTOCK AREA WITHOUT RADICULAR COMPONENT RATED 5 ON A PAIN SCALE OF 1 TO 10.  DISKOGRAPHY DEMONSTRATES NORMAL NUCLEAR SPREAD OF CONTRAST. CT DISKOGRAPHY DEMONSTRATES NORMAL NUCLEAR SPREAD OF CONTRAST WITHOUT ANY ANNULAR DEFECT.  THERE IS VERY MILD POSTERIOR DISK BULGE NOTED AS WELL AS EARLY BILATERAL FACET HYPERTROPHY. L5-S1:  INJECTION OF THE RAY CAGE ON THE LEFT IS NORMAL WITH NO CONTRAST FLOWING CENTRALLY AND A SMALL AMOUNT OF CONTRAST EXTRAVASATION PERIPHERALLY, OBTAINING INJECTION PRESSURES UP TO 100 PSI. INJECTION ON THE RIGHT DID RESULT IN CONTRAST FLOWING WITHIN THE INTERSTICES OF THE RAY CAGE AND ALSO MORE CENTRALLY WITHIN THE INTERSPACE.  THIS WAS CONFIRMED ON LATERAL IMAGING TO REPRESENT CONTRAST WITHIN THE INTERSPACE AND NOT FLOWING IN THE EPIDURAL SPACE ANTERIOR OR POSTERIOR TO THE DISK.   CT THROUGH THIS LEVEL DEMONSTRATES THE TWO RAY CAGES PROJECTING IN EXPECTED LOCATION WITH THE LEFT SIDED  CAGE PROJECTING JUST ANTERIOR TO THE ANTERIOR MARGIN OF THE SACRAL CORTEX.  THERE IS NO EVIDENCE OF LUCENCY AROUND THE HARDWARE. IMPRESSION 1)     MORPHOLOGY NORMAL DISKOGRAPHY AT L4-5 ALTHOUGH THERE WAS A CONCORDANT PAIN RESPONSE AS DETAILED ABOVE. 2)    ABNORMAL RAY CAGE INJECTION AT L5-S1 MANIFEST BY CONTRAST ENTERING THE INTERSTICES OF THE RIGHT SIDED STENT AND ENTERING THE INTERSPACE FROM THE RIGHT SIDED INJECTION.   Lumbar DG Diskogram views:  Results for orders placed in visit on 01/11/99  IR Diskography Lumbar   Narrative FINDINGS CLINICAL DATA:  BACK AND LEG PAIN, RIGHT WORSE THAN LEFT. ABNORMAL DISC AT L5-S1. MILDLY ABNORMAL DISK AT L4-5. DIAGNOSTIC LUMBAR DISC INJECTION L4-5: AN OBLIQUE APPROACH WAS TAKEN USING A CURVED 22 GAUGE CHIBA NEEDLE. THE OVERLYING SKIN WAS SCRUBBED WITH BETADINE AND STERILELY DRAPED. SKIN ANESTHESIA WAS CARRIED OUT  USING 1% LIDOCAINE. THE NEEDLE WAS DIRECTED INTO THE NUCLEAR REGION OF THE [place]. CONTRAST MIXED WITH DILUTE ANCEF WAS INJECTED DURING FLUOROSCOPIC OBSERVATION AND PRESSURE MONITORING. OPENING PRESSURE WAS 40 PSI. THE DISC ACCOMMODATED ONLY 2 CC OF CONTRAST WITH ELEVATION OF INTRADISCAL PRESSURE TO 100 PSI. THERE IS A NORMAL NUCLEAR PATTERN. THE PATIENT FELT SOME MILD BACK PRESSURE BUT DID NOT FEEL HIS FAMILIAR PAIN. DIAGNOSTIC DISC INJECTION L5-S1: AN OBLIQUE APPROACH WAS TAKEN USING A CURVED 22 GAUGE CHIBA NEEDLE. THIS WAS POSITIONED IN THE NUCLEUS OF THE [place]. CONTRAST MIXED WITH DILUTE ANCEF WAS INJECTED DURING DIRECT OBSERVATION AND PRESSURE MONITORING. OPENING PRESSURE WAS 10 PSI. THE DISC ACCOMMODATED 3 CC OF CONTRAST WITH ELEVATION OF INTRADISCAL PRESSURE ONLY TO 40 PSI. THE PATIENT REPORTED REPRODUCTION OF HIS FAMILIAR HOME PAIN AFFECTING HIS LOW BACK.  THERE WAS SOME RADICULAR SYMPTOMATOLOGY TOWARDS THE LEFT. IMAGING SHOWS AN ABNORMAL PATTERN OF CONTRAST EXTENSION WITH THE CONTRAST PERVADING THE NUCLEAR REGION AND EXTENDING INTO ANNULAR REGIONS, PARTICULARLY IN THE LEFT POSTEROLATERAL DIRECTION. IMPRESSION   Lumbar DG Epidurogram OP:  Results for orders placed in visit on 05/15/01  DG Epidurography   Narrative FINDINGS CLINICAL DATA:  BACK AND BILATERAL LEG PAIN. THE PATIENT REPORTS NO SIGNIFICANT IMPROVEMENT FOLLOWING THE RIGHT L4 SELECTIVE NERVE ROOT BLOCK AND TRANSFORAMINAL EPIDURAL.  IN ADDITION, NOW HE FEELS LIKE HE HAS INCREASED PAIN ON THE LEFT.  I ELECTED TO ATTEMPT A CAUDAL EPIDURAL TODAY. CAUDAL EPIDURAL NOTE IS MADE THAT THE PATIENT HAS A VERY LOW TOLERANCE FOR PAIN DUE TO NEEDLES. FOLLOWING INFORMED CONSENT, STERILE PREPARATION OF THE SACROCOCCYGEAL AREA, AND ADEQUATE LOCAL ANESTHESIA, A 22 GAUGE SPINAL NEEDLE WAS PLACED IN THE CAUDAL EPIDURAL SPACE.  CONTRAST INJECTION SHOWED GOOD CEPHALAD SPREAD TO L5-S1. I INJECTED 120 MG OF DEPO-MEDROL ALONG WITH 3 CC OF 1  PERCENT LIDOCAINE.  THE PATIENT TOLERATED THIS WITH SOME DIFFICULTY.  POST PROCEDURE HE WAS ABLE TO AMBULATE OF HIS OWN ACCORD, BUT WAS NOT SIGNIFICANTLY IMPROVED. THE PATIENT WAS COUNSELED REGARDING EXPECTED TIME COURSE OF STEROID AND ANESTHETIC. IMPRESSION TECHNICALLY SUCCESSFUL CAUDAL EPIDURAL; THIS IS #2 IN A SERIES OF INJECTIONS FOR THIS PATIENT.   Knee Imaging: Knee-R DG 1-2 views:  Results for orders placed during the hospital encounter of 03/18/16  DG Knee 1-2 Views Right   Narrative CLINICAL DATA:  Pain.  EXAM: RIGHT KNEE - 1-2 VIEW  COMPARISON:  No recent prior.  FINDINGS: No acute bony or joint abnormality identified. Mild medial compartment degenerative change. No evidence of fracture or dislocation.  IMPRESSION: Mild medial compartment degenerative change.  No acute abnormality.   Electronically Signed   By: Marcello Moores  Register   On: 03/18/2016 15:12    Knee-L DG 1-2 views:  Results for orders placed during the hospital encounter of 03/18/16  DG Knee 1-2 Views Left   Narrative CLINICAL DATA:  Pain.  No injury .  EXAM: LEFT KNEE - 1-2 VIEW  COMPARISON:  No recent prior .  FINDINGS: No acute bony or joint abnormality identified. No evidence of fracture or dislocation. Mild medial compartment degenerative change.  IMPRESSION: Mild medial compartment degenerative change.  No acute abnormality .   Electronically Signed   By: Marcello Moores  Register   On: 03/18/2016 15:11    Wrist Imaging: Wrist-R DG Complete:  Results for orders placed during the hospital encounter of 07/05/14  DG Wrist Complete Right   Narrative CLINICAL DATA:  Right hand fracture status post fall on 04/18-4/19  EXAM: RIGHT WRIST - COMPLETE 3+ VIEW  COMPARISON:  07/04/2014  FINDINGS: There is a comminuted fracture at the base of the fifth metacarpal extending to the articular surface with mild displacement. There is a nondisplaced fracture of the distal dorsal peripheral aspect  of the hamate.  There is no other fracture or dislocation.  IMPRESSION: There is a comminuted fracture at the base of the fifth metacarpal extending to the articular surface with mild displacement.  There is a nondisplaced fracture of the distal dorsal peripheral aspect of the hamate.   Electronically Signed   By: Kathreen Devoid   On: 07/05/2014 15:57    Hand Imaging: Hand-R DG Complete:  Results for orders placed during the hospital encounter of 07/04/14  DG Hand Complete Right   Narrative CLINICAL DATA:  Right lateral hand pain since fall 1 week ago. Initial encounter.  EXAM: RIGHT HAND - COMPLETE 3+ VIEW  COMPARISON:  None.  FINDINGS: There is a minimally displaced slightly comminuted fracture involving the base of the fifth metacarpal with associated minimal foreshortening and dorsal angulation. There is suspected extension of the fracture to the adjacent [facility] joint. Expected adjacent soft tissue swelling. No radiopaque foreign body.  No additional fractures identified. Remaining joint spaces appear preserved. No erosions. No evidence of chondrocalcinosis.  IMPRESSION: Minimally displaced and slightly comminuted fracture involving the base of the fifth neck carpal with suspected extension to the adjacent [facility] joint.   Electronically Signed   By: Sandi Mariscal M.D.   On: 07/04/2014 16:17    Complexity Note: Imaging results reviewed. Results shared with Mr. Schar, using Layman's terms.                         Meds   Current Outpatient Medications:  .  albuterol (VENTOLIN HFA) 108 (90 Base) MCG/ACT inhaler, Inhale 2 puffs into the lungs every 6 (six) hours as needed for wheezing or shortness of breath., Disp: 18 g, Rfl: 3 .  cyclobenzaprine (FLEXERIL) 10 MG tablet, Take 1 tablet (10 mg total) by mouth at bedtime., Disp: 30 tablet, Rfl: 2 .  famotidine (PEPCID AC) 10 MG chewable tablet, Chew 10 mg by mouth as needed for heartburn., Disp: , Rfl:  .  gabapentin  (NEURONTIN) 300 MG capsule, Take 1 capsule (300 mg total) by mouth every 8 (eight) hours., Disp: 90 capsule, Rfl: 2 .  metoprolol (LOPRESSOR) 50 MG tablet, Take 1 tablet (50 mg total) by mouth 2 (two) times daily. (Patient taking differently: Take 50 mg by mouth 3 (three) times daily. ), Disp: 60 tablet, Rfl: 11 .  omeprazole (PRILOSEC OTC) 20 MG tablet, Take 20 mg  by mouth 2 (two) times daily., Disp: , Rfl:  .  promethazine (PHENERGAN) 12.5 MG tablet, Take 1 tablet (12.5 mg total) by mouth daily as needed for nausea or vomiting. Each prescription to last a mth, Disp: 10 tablet, Rfl: 1 .  traMADol (ULTRAM) 50 MG tablet, Take 1-2 tablets (50-100 mg total) by mouth 4 (four) times daily for 30 days. Must last 30 days., Disp: 240 tablet, Rfl: 0 .  traZODone (DESYREL) 100 MG tablet, Take 1 tablet (100 mg total) by mouth at bedtime as needed. (Patient taking differently: Take 50-100 mg by mouth at bedtime as needed for sleep. ), Disp: 30 tablet, Rfl: 3  ROS  Constitutional: Denies any fever or chills Gastrointestinal: No reported hemesis, hematochezia, vomiting, or acute GI distress Musculoskeletal: Denies any acute onset joint swelling, redness, loss of ROM, or weakness Neurological: No reported episodes of acute onset apraxia, aphasia, dysarthria, agnosia, amnesia, paralysis, loss of coordination, or loss of consciousness  Allergies  Mr. Soto is allergic to cozaar [losartan] and lisinopril.  [place]  Drug: Mr. Klas  reports no history of drug use. Alcohol:  reports current alcohol use. Tobacco:  reports that he has been smoking cigarettes. He has a 15.00 pack-year smoking history. He has never used smokeless tobacco. 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 Surgical: Mr. Botz  has a past surgical history that includes Back surgery (1995, 1999); Neck surgery (2000); Nasal sinus surgery; left heart catheterization with coronary angiogram (N/A, 01/13/2014); Subacromial decompression (Right, 11/16/2014); Nissen fundoplication; 24 hour ph study (N/A, 01/16/2015); Upper gastrointestinal endoscopy; Colonoscopy (1998); and Shoulder surgery (Right). Family: family history includes Alcohol abuse in his father and paternal grandfather; Breast cancer in his paternal grandmother; Colon polyps in his mother; Hypertension in his mother; Lung cancer in his father.  Constitutional Exam  General appearance: Well nourished, well developed, and well hydrated. In no apparent acute distress Vitals:   03/23/18 0807  BP: (!) 149/116  Pulse: 69  Resp: 16  Temp: 97.6 F (36.4 C)  TempSrc: Oral  SpO2: 99%  Weight: 220 lb (99.8 kg)  Height: '6\' 4"'  (1.93 m)   BMI Assessment: Estimated body mass index is 26.78 kg/m as calculated from the following:   Height as of this encounter: '6\' 4"'  (1.93 m).   Weight as of this encounter: 220 lb (99.8 kg).  BMI interpretation table: BMI level Category Range association with higher incidence of chronic pain  <18 kg/m2 Underweight   18.5-24.9 kg/m2 Ideal body weight   25-29.9 kg/m2 Overweight Increased incidence by 20%  30-34.9 kg/m2 Obese (Class I) Increased incidence by 68%  35-39.9 kg/m2 Severe obesity (Class II) Increased incidence by 136%  >40 kg/m2 Extreme obesity (Class III) Increased incidence by 254%   Patient's current BMI Ideal Body weight  Body mass index is 26.78 kg/m. Ideal body weight: 86.8 kg (191 lb  5.7 oz) Adjusted ideal body weight: 92 kg (202 lb 13 oz)   BMI Readings from Last 4 Encounters:  03/23/18 26.78 kg/m  03/12/18 26.78 kg/m  01/03/18 27.56 kg/m  01/02/18 27.56 kg/m   Wt  Readings from Last 4 Encounters:  03/23/18 220 lb (99.8 kg)  03/12/18 220 lb (99.8 kg)  01/03/18 226 lb 6.6 oz (102.7 kg)  01/02/18 226 lb 6.4 oz (102.7 kg)  Psych/Mental status: Alert, oriented x 3 (person, place, & time)       Eyes: PERLA Respiratory: No evidence of acute respiratory distress  Cervical Spine Area Exam  Skin & Axial Inspection: No masses, redness, edema, swelling, or associated skin lesions Alignment: Symmetrical Functional ROM: Unrestricted ROM      Stability: No instability detected Muscle Tone/Strength: Functionally intact. No obvious neuro-muscular anomalies detected. Sensory (Neurological): Unimpaired Palpation: No palpable anomalies              Upper Extremity (UE) Exam    Side: Right upper extremity  Side: Left upper extremity  Skin & Extremity Inspection: Skin color, temperature, and hair growth are WNL. No peripheral edema or cyanosis. No masses, redness, swelling, asymmetry, or associated skin lesions. No contractures.  Skin & Extremity Inspection: Skin color, temperature, and hair growth are WNL. No peripheral edema or cyanosis. No masses, redness, swelling, asymmetry, or associated skin lesions. No contractures.  Functional ROM: Unrestricted ROM          Functional ROM: Unrestricted ROM          Muscle Tone/Strength: Functionally intact. No obvious neuro-muscular anomalies detected.  Muscle Tone/Strength: Functionally intact. No obvious neuro-muscular anomalies detected.  Sensory (Neurological): Unimpaired          Sensory (Neurological): Unimpaired          Palpation: No palpable anomalies              Palpation: No palpable anomalies              Provocative Test(s):  Phalen's test: deferred Tinel's test: deferred Apley's scratch test (touch opposite shoulder):   Action 1 (Across chest): deferred Action 2 (Overhead): deferred Action 3 (LB reach): deferred   Provocative Test(s):  Phalen's test: deferred Tinel's test: deferred Apley's scratch test (touch opposite shoulder):  Action 1 (Across chest): deferred Action 2 (Overhead): deferred Action 3 (LB reach): deferred    Thoracic Spine Area Exam  Skin & Axial Inspection: No masses, redness, or swelling Alignment: Symmetrical Functional ROM: Unrestricted ROM Stability: No instability detected Muscle Tone/Strength: Functionally intact. No obvious neuro-muscular anomalies detected. Sensory (Neurological): Unimpaired Muscle strength & Tone: No palpable anomalies  Lumbar Spine Area Exam  Skin & Axial Inspection: No masses, redness, or swelling Alignment: Symmetrical Functional ROM: Unrestricted ROM       Stability: No instability detected Muscle Tone/Strength: Functionally intact. No obvious neuro-muscular anomalies detected. Sensory (Neurological): Unimpaired Palpation: No palpable anomalies       Provocative Tests: Hyperextension/rotation test: (+) bilaterally for facet joint pain. Lumbar quadrant test (Kemp's test): (+) bilaterally for facet joint pain. Lateral bending test: deferred today       Patrick's Maneuver: (+) for left-sided S-I arthralgia and for right hip arthralgia FABER* test: (+) for left-sided S-I arthralgia and for right hip arthralgia S-I anterior distraction/compression test: deferred today         S-I lateral compression test: deferred today         S-I Thigh-thrust test: deferred today         S-I Gaenslen's test: deferred today         *(Flexion, ABduction and External Rotation)  Gait & Posture Assessment  Ambulation: Unassisted Gait: Relatively normal for age and body habitus Posture: WNL   Lower Extremity  Exam    Side: Right lower extremity  Side: Left lower extremity  Stability: No instability observed          Stability: No instability observed           Skin & Extremity Inspection: Skin color, temperature, and hair growth are WNL. No peripheral edema or cyanosis. No masses, redness, swelling, asymmetry, or associated skin lesions. No contractures.  Skin & Extremity Inspection: Skin color, temperature, and hair growth are WNL. No peripheral edema or cyanosis. No masses, redness, swelling, asymmetry, or associated skin lesions. No contractures.  Functional ROM: Decreased ROM for hip joint          Functional ROM: Decreased ROM for hip joint          Muscle Tone/Strength: Functionally intact. No obvious neuro-muscular anomalies detected.  Muscle Tone/Strength: Functionally intact. No obvious neuro-muscular anomalies detected.  Sensory (Neurological): Articular pain pattern        Sensory (Neurological): Unimpaired        DTR: Patellar: deferred today Achilles: deferred today Plantar: deferred today  DTR: Patellar: deferred today Achilles: deferred today Plantar: deferred today  Palpation: No palpable anomalies  Palpation: No palpable anomalies   Assessment  Primary Diagnosis & Pertinent Problem List: The primary encounter diagnosis was Lower extremity neuropathy (Primary Source of Pain) (Bilateral) (R>L). Diagnoses of Chronic low back pain (Secondary source of pain) (Bilateral) (R>L), Chronic radicular low back pain [facility] source of pain) (Right) (to calf), Chronic pain syndrome, Failed back surgical syndrome, Lumbar facet syndrome (Bilateral) (R>L), Chronic sacroiliac joint pain (Bilateral) (R>L), Spondylosis without myelopathy or radiculopathy, lumbar region, Other specified dorsopathies, sacral and sacrococcygeal region, Chronic hip pain (Right), and Chronic sacroiliac joint pain (Left) were also pertinent to this visit.  Status Diagnosis  Controlled Controlled Controlled 1. 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 Chronic low back pain (Secondary source of pain) (Bilateral) (R>L)   3. Chronic radicular  low back pain [facility] source of pain) (Right) (to calf)   4. Chronic pain syndrome   5. Failed back surgical syndrome   6. Lumbar facet syndrome (Bilateral) (R>L)   7. Chronic sacroiliac joint pain (Bilateral) (R>L)   8. Spondylosis without myelopathy or radiculopathy, lumbar region   9. Other specified dorsopathies, sacral and sacrococcygeal region   10. Chronic hip pain (Right)   11. Chronic sacroiliac joint pain (Left)     Problems updated and reviewed during this visit: Problem  Spondylosis Without Myelopathy Or Radiculopathy, Lumbar Region  Other Specified Dorsopathies, Sacral and Sacrococcygeal Region  Chronic hip pain (Right)  Chronic sacroiliac joint pain (Left)  Alcohol Withdrawal ([name])  Pain Management Contract Broken   Patient no longer under opioid agreement contract.  He is still a patient with in the clinic and is eligible for interventional therapy.   Dyspepsia  Major Depressive Disorder, Recurrent Episode, Severe ([name])  Diarrhea  Right Shoulder Pain (Resolved)  Substance abuse ([name]) (Resolved)  Seizure due to alcohol withdrawal ([name]) (Resolved)   Per patient report   Alcohol dependence ([place]) (Resolved)       Nicotine Dependence (Resolved)  History of Alcohol Abuse (Resolved)  Alcoholic Hepatitis (Resolved)  H/O Etoh Abuse (Resolved)  Alcohol withdrawal ([place]) (Resolved)       Alcohol Abuse (Resolved)   Stable for discharge    Abdominal Distension (Resolved)  Wheezing (Resolved)  Adjustment Disorder With Depressed Mood (Resolved)  Essential Hypertension (Resolved)  Accelerated Hypertension (Resolved)  Acute respiratory failure ([name]) (Resolved)  Acute Encephalopathy (Resolved)  Thrush, Oral (Resolved)  Cardiomyopathy, dilated, 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 Mold exp while on ACEI  05/02/10 ACE d/c 07/01/10 by Dr Maceo Pro    Plan of Care  Pharmacotherapy (Medications Ordered): Meds ordered this encounter  Medications  . traMADol (ULTRAM) 50 MG tablet    Sig: Take 1-2 tablets (50-100 mg total) by mouth 4 (four) times daily for 30 days. Must last 30 days.    Dispense:  240 tablet    Refill:  0    [place] STOP ACT - Not applicable. Fill one day early if pharmacy is closed on scheduled refill date. Do not fill until: 03/23/18. Must last 30 days. To last until: 04/22/18.   Medications administered today: [name] had no medications administered during this visit.   Procedure Orders     HIP INJECTION Lab Orders  No laboratory test(s) ordered today   Imaging Orders  No imaging studies ordered today   Referral Orders  No referral(s) requested today   Interventional management options: Planned, scheduled, and/or pending:   Diagnostic right-sided intra-articular hip joint injection #1 under fluoroscopic guidance and IV sedation Later I plan to bring him back for a diagnostic left-sided intra-articular SI joint injection.  If this provides him with good relief, then we will move onto RFA of the left SI.  Following that, we plan to do radiofrequency of the lumbar facets, bilaterally.   Considering:   Lower extremity EMG/PNCV. (Radiculopathy versus peripheral neuropathy) Upper extremity EMG/PNCV. Possibleright-sided L3 + L4 Lumbarsympathetic RFA Possible Racz procedure Diagnostic cervical epidural steroidinjection  Diagnostic bilateral cervical facet block  Possible bilateral cervical facet RFA Diagnostic bilateral lumbar facet block Possible bilateral lumbar facet RFA Diagnostic right intra-articular shoulder joint injection Diagnostic right suprascapular nerveblock Possibleright suprascapular RFA Diagnostic bilateral  intra-articular knee joint injection Possible bilateral series of 5 [place] knee injections Diagnostic bilateral Genicular nerve block Possible bilateral Genicular RFA Diagnostic bilateral greater occipital nerve block Possible bilateral greater occipital nerve RFA Diagnostic bilateral C2+TON nerve block Possible bilateral C2 + TON RFA   Palliative PRN treatment(s):   None at this time   Provider-requested follow-up: Return for Med-Mgmt, w/ Dionisio David, NP (in 1 month) +  Procedure (w/ sedation): (R) IA Hip inj #1.  Future Appointments  Date Time Provider [place]  04/07/2018  9:45 AM Milinda Pointer, MD [facility] None  04/20/2018 10:45 AM Vevelyn Francois, NP [facility] None   Primary Care Physician: Ladell Pier, MD Location: [facility] Note by: [name], MD Date: 03/23/2018; Time: 9:37 AM

## 2018-03-23 ENCOUNTER — Encounter: Payer: Self-pay | Admitting: Pain Medicine

## 2018-03-23 ENCOUNTER — Ambulatory Visit: Payer: Self-pay | Attending: Pain Medicine | Admitting: Pain Medicine

## 2018-03-23 VITALS — BP 149/116 | HR 69 | Temp 97.6°F | Resp 16 | Ht 76.0 in | Wt 220.0 lb

## 2018-03-23 DIAGNOSIS — M533 Sacrococcygeal disorders, not elsewhere classified: Secondary | ICD-10-CM | POA: Insufficient documentation

## 2018-03-23 DIAGNOSIS — G8929 Other chronic pain: Secondary | ICD-10-CM

## 2018-03-23 DIAGNOSIS — G894 Chronic pain syndrome: Secondary | ICD-10-CM | POA: Insufficient documentation

## 2018-03-23 DIAGNOSIS — G5793 Unspecified mononeuropathy of bilateral lower limbs: Secondary | ICD-10-CM | POA: Insufficient documentation

## 2018-03-23 DIAGNOSIS — M5388 Other specified dorsopathies, sacral and sacrococcygeal region: Secondary | ICD-10-CM | POA: Insufficient documentation

## 2018-03-23 DIAGNOSIS — M5416 Radiculopathy, lumbar region: Secondary | ICD-10-CM | POA: Insufficient documentation

## 2018-03-23 DIAGNOSIS — M47816 Spondylosis without myelopathy or radiculopathy, lumbar region: Secondary | ICD-10-CM | POA: Insufficient documentation

## 2018-03-23 DIAGNOSIS — M25551 Pain in right hip: Secondary | ICD-10-CM | POA: Insufficient documentation

## 2018-03-23 DIAGNOSIS — M5441 Lumbago with sciatica, right side: Secondary | ICD-10-CM | POA: Insufficient documentation

## 2018-03-23 DIAGNOSIS — M961 Postlaminectomy syndrome, not elsewhere classified: Secondary | ICD-10-CM | POA: Insufficient documentation

## 2018-03-23 DIAGNOSIS — M5442 Lumbago with sciatica, left side: Secondary | ICD-10-CM | POA: Insufficient documentation

## 2018-03-23 HISTORY — DX: Sacrococcygeal disorders, not elsewhere classified: M53.3

## 2018-03-23 HISTORY — DX: Other chronic pain: G89.29

## 2018-03-23 MED ORDER — TRAMADOL HCL 50 MG PO TABS: 50.0000 mg | ORAL_TABLET | Freq: Four times a day (QID) | ORAL | 0 refills | Status: DC

## 2018-03-23 MED FILL — traMADol HCL 50 MG TABS: 50 | 30 days supply | Qty: 240 | Fill #0

## 2018-03-23 NOTE — Patient Instructions (Addendum)
______________________________________________________________________________________________  Specialty Pain Scale  Introduction:  There are significant differences in how pain is reported. The word pain usually refers to physical pain, but it is also a common synonym of suffering. The medical community uses a scale from 0 (zero) to 10 (ten) to report pain level. Zero (0) is described as "no pain", while ten (10) is described as "the worse pain you can imagine". The problem with this scale is that physical pain is reported along with suffering. Suffering refers to mental pain, or more often yet it refers to any unpleasant feeling, emotion or aversion associated with the perception of harm or threat of harm. It is the psychological component of pain.  Pain Specialists prefer to separate the two components. The pain scale used by this practice is the Verbal Numerical Rating Scale (VNRS-11). This scale is for the physical pain only. DO NOT INCLUDE how your pain psychologically affects you. This scale is for adults 21 years of age and older. It has 11 (eleven) levels. The 1st level is 0/10. This means: "right now, I have no pain". In the context of pain management, it also means: "right now, my physical pain is under control with the current therapy".  General Information:  The scale should reflect your current level of pain. Unless you are specifically asked for the level of your worst pain, or your average pain. If you are asked for one of these two, then it should be understood that it is over the past 24 hours.  Levels 1 (one) through 5 (five) are described below, and can be treated as an outpatient. Ambulatory pain management facilities such as ours are more than adequate to treat these levels. Levels 6 (six) through 10 (ten) are also described below, however, these must be treated as a hospitalized patient. While levels 6 (six) and 7 (seven) may be evaluated at an urgent care facility, levels 8  (eight) through 10 (ten) constitute medical emergencies and as such, they belong in a hospital's emergency department. When having these levels (as described below), do not come to our office. Our facility is not equipped to manage these levels. Go directly to an urgent care facility or an emergency department to be evaluated.  Definitions:  Activities of Daily Living (ADL): Activities of daily living (ADL or ADLs) is a term used in healthcare to refer to people's daily self-care activities. Health professionals often use a person's ability or inability to perform ADLs as a measurement of their functional status, particularly in regard to people post injury, with disabilities and the elderly. There are two ADL levels: Basic and Instrumental. Basic Activities of Daily Living (BADL  or BADLs) consist of self-care tasks that include: Bathing and showering; personal hygiene and grooming (including brushing/combing/styling hair); dressing; Toilet hygiene (getting to the toilet, cleaning oneself, and getting back up); eating and self-feeding (not including cooking or chewing and swallowing); functional mobility, often referred to as "transferring", as measured by the ability to walk, get in and out of bed, and get into and out of a chair; the broader definition (moving from one place to another while performing activities) is useful for people with different physical abilities who are still able to get around independently. Basic ADLs include the things many people do when they get up in the morning and get ready to go out of the house: get out of bed, go to the toilet, bathe, dress, groom, and eat. On the average, loss of function typically follows a particular order.   Hygiene is the first to go, followed by loss of toilet use and locomotion. The last to go is the ability to eat. When there is only one remaining area in which the person is independent, there is a 62.9% chance that it is eating and only a 3.5% chance  that it is hygiene. Instrumental Activities of Daily Living (IADL or IADLs) are not necessary for fundamental functioning, but they let an individual live independently in a community. IADL consist of tasks that include: cleaning and maintaining the house; home establishment and maintenance; care of others (including selecting and supervising caregivers); care of pets; child rearing; managing money; managing financials (investments, etc.); meal preparation and cleanup; shopping for groceries and necessities; moving within the community; safety procedures and emergency responses; health management and maintenance (taking prescribed medications); and using the telephone or other form of communication.  Instructions:  Most patients tend to report their pain as a combination of two factors, their physical pain and their psychosocial pain. This last one is also known as "suffering" and it is reflection of how physical pain affects you socially and psychologically. From now on, report them separately.  From this point on, when asked to report your pain level, report only your physical pain. Use the following table for reference.  Pain Clinic Pain Levels (0-5/10)  Pain Level Score  Description  No Pain 0   Mild pain 1 Nagging, annoying, but does not interfere with basic activities of daily living (ADL). Patients are able to eat, bathe, get dressed, toileting (being able to get on and off the toilet and perform personal hygiene functions), transfer (move in and out of bed or a chair without assistance), and maintain continence (able to control bladder and bowel functions). Blood pressure and heart rate are unaffected. A normal heart rate for a healthy adult ranges from 60 to 100 bpm (beats per minute).   Mild to moderate pain 2 Noticeable and distracting. Impossible to hide from other people. More frequent flare-ups. Still possible to adapt and function close to normal. It can be very annoying and may have  occasional stronger flare-ups. With discipline, patients may get used to it and adapt.   Moderate pain 3 Interferes significantly with activities of daily living (ADL). It becomes difficult to feed, bathe, get dressed, get on and off the toilet or to perform personal hygiene functions. Difficult to get in and out of bed or a chair without assistance. Very distracting. With effort, it can be ignored when deeply involved in activities.   Moderately severe pain 4 Impossible to ignore for more than a few minutes. With effort, patients may still be able to manage work or participate in some social activities. Very difficult to concentrate. Signs of autonomic nervous system discharge are evident: dilated pupils (mydriasis); mild sweating (diaphoresis); sleep interference. Heart rate becomes elevated (>115 bpm). Diastolic blood pressure (lower number) rises above 100 mmHg. Patients find relief in laying down and not moving.   Severe pain 5 Intense and extremely unpleasant. Associated with frowning face and frequent crying. Pain overwhelms the senses.  Ability to do any activity or maintain social relationships becomes significantly limited. Conversation becomes difficult. Pacing back and forth is common, as getting into a comfortable position is nearly impossible. Pain wakes you up from deep sleep. Physical signs will be obvious: pupillary dilation; increased sweating; goosebumps; brisk reflexes; cold, clammy hands and feet; nausea, vomiting or dry heaves; loss of appetite; significant sleep disturbance with inability to fall asleep or to   remain asleep. When persistent, significant weight loss is observed due to the complete loss of appetite and sleep deprivation.  Blood pressure and heart rate becomes significantly elevated. Caution: If elevated blood pressure triggers a pounding headache associated with blurred vision, then the patient should immediately seek attention at an urgent or emergency care unit, as  these may be signs of an impending stroke.    Emergency Department Pain Levels (6-10/10)  Emergency Room Pain 6 Severely limiting. Requires emergency care and should not be seen or managed at an outpatient pain management facility. Communication becomes difficult and requires great effort. Assistance to reach the emergency department may be required. Facial flushing and profuse sweating along with potentially dangerous increases in heart rate and blood pressure will be evident.   Distressing pain 7 Self-care is very difficult. Assistance is required to transport, or use restroom. Assistance to reach the emergency department will be required. Tasks requiring coordination, such as bathing and getting dressed become very difficult.   Disabling pain 8 Self-care is no longer possible. At this level, pain is disabling. The individual is unable to do even the most "basic" activities such as walking, eating, bathing, dressing, transferring to a bed, or toileting. Fine motor skills are lost. It is difficult to think clearly.   Incapacitating pain 9 Pain becomes incapacitating. Thought processing is no longer possible. Difficult to remember your own name. Control of movement and coordination are lost.   The worst pain imaginable 10 At this level, most patients pass out from pain. When this level is reached, collapse of the autonomic nervous system occurs, leading to a sudden drop in blood pressure and heart rate. This in turn results in a temporary and dramatic drop in blood flow to the brain, leading to a loss of consciousness. Fainting is one of the body's self defense mechanisms. Passing out puts the brain in a calmed state and causes it to shut down for a while, in order to begin the healing process.    Summary: 1. Refer to this scale when providing [place] with your pain level. 2. Be accurate and careful when reporting your pain level. This will help with your care. 3. Over-reporting your pain level will  lead to loss of credibility. 4. Even a level of 1/10 means that there is pain and will be treated at our facility. 5. High, inaccurate reporting will be documented as "Symptom Exaggeration", leading to loss of credibility and suspicions of possible secondary gains such as obtaining more narcotics, or wanting to appear disabled, for fraudulent reasons. 6. Only pain levels of 5 or below will be seen at our facility. 7. Pain levels of 6 and above will be sent to the Emergency Department and the appointment cancelled. ______________________________________________________________________________________________   ____________________________________________________________________________________________  Medication Rules  Purpose: To inform patients, and their family members, of our rules and regulations.  Applies to: All patients receiving prescriptions (written or electronic).  Pharmacy of record: Pharmacy where electronic prescriptions will be sent. If written prescriptions are taken to a different pharmacy, please inform the nursing staff. The pharmacy listed in the electronic medical record should be the one where you would like electronic prescriptions to be sent.  Electronic prescriptions: In compliance with the [place] (STOP) Act of 2017 (Session Lanny Cramp [phone]), effective March 11, 2018, all controlled substances must be electronically prescribed. Calling prescriptions to the pharmacy will cease to exist.  Prescription refills: Only during scheduled appointments. Applies to all prescriptions.  NOTE: The  following applies primarily to controlled substances (Opioid* Pain Medications).   Patient's responsibilities: 1. Pain Pills: Bring all pain pills to every appointment (except for procedure appointments). 2. Pill Bottles: Bring pills in original pharmacy bottle. Always bring the newest bottle. Bring bottle, even if empty. 3. Medication  refills: You are responsible for knowing and keeping track of what medications you take and those you need refilled. The day before your appointment: write a list of all prescriptions that need to be refilled. The day of the appointment: give the list to the admitting nurse. Prescriptions will be written only during appointments. If you forget a medication: it will not be "Called in", "Faxed", or "electronically sent". You will need to get another appointment to get these prescribed. No early refills. Do not call asking to have your prescription filled early. 4. Prescription Accuracy: You are responsible for carefully inspecting your prescriptions before leaving our office. Have the discharge nurse carefully go over each prescription with you, before taking them home. Make sure that your name is accurately spelled, that your address is correct. Check the name and dose of your medication to make sure it is accurate. Check the number of pills, and the written instructions to make sure they are clear and accurate. Make sure that you are given enough medication to last until your next medication refill appointment. 5. Taking Medication: Take medication as prescribed. When it comes to controlled substances, taking less pills or less frequently than prescribed is permitted and encouraged. Never take more pills than instructed. Never take medication more frequently than prescribed.  6. Inform other Doctors: Always inform, all of your healthcare providers, of all the medications you take. 7. Pain Medication from other Providers: You are not allowed to accept any additional pain medication from any other Doctor or Healthcare provider. There are two exceptions to this rule. (see below) In the event that you require additional pain medication, you are responsible for notifying us, as stated below. 8. Medication Agreement: You are responsible for carefully reading and following our Medication Agreement. This must be  signed before receiving any prescriptions from our practice. Safely store a copy of your signed Agreement. Violations to the Agreement will result in no further prescriptions. (Additional copies of our Medication Agreement are available upon request.) 9. Laws, Rules, & Regulations: All patients are expected to follow all Federal and [employer], [employer], Rules, [employer]. Ignorance of the Laws does not constitute a valid excuse. The use of any illegal substances is prohibited. 10. Adopted CDC guidelines & recommendations: Target dosing levels will be at or below 60 MME/day. Use of benzodiazepines** is not recommended.  Exceptions: There are only two exceptions to the rule of not receiving pain medications from other Healthcare Providers. 1. Exception #1 (Emergencies): In the event of an emergency (i.e.: accident requiring emergency care), you are allowed to receive additional pain medication. However, you are responsible for: As soon as you are able, call our office [phone], at any time of the day or night, and leave a message stating your name, the date and nature of the emergency, and the name and dose of the medication prescribed. In the event that your call is answered by a member of our staff, make sure to document and save the date, time, and the name of the person that took your information.  2. Exception #2 (Planned Surgery): In the event that you are scheduled by another doctor or dentist to have any type of surgery or procedure,  you are allowed (for a period no longer than 30 days), to receive additional pain medication, for the acute post-op pain. However, in this case, you are responsible for picking up a copy of our "Post-op Pain Management for Surgeons" handout, and giving it to your surgeon or dentist. This document is available at our office, and does not require an appointment to obtain it. Simply go to our office during business hours (Monday-Thursday from 8:00 AM to 4:00 PM)  (Friday 8:00 AM to 12:00 Noon) or if you have a scheduled appointment with [place], prior to your surgery, and ask for it by name. In addition, you will need to provide [place] with your name, name of your surgeon, type of surgery, and date of procedure or surgery.  *Opioid medications include: morphine, codeine, oxycodone, oxymorphone, hydrocodone, hydromorphone, meperidine, tramadol, tapentadol, buprenorphine, fentanyl, methadone. **Benzodiazepine medications include: diazepam (Valium), alprazolam (Xanax), clonazepam (Klonopine), lorazepam (Ativan), clorazepate (Tranxene), chlordiazepoxide (Librium), estazolam (Prosom), oxazepam (Serax), temazepam (Restoril), triazolam (Halcion) (Last updated: 05/08/2017) ____________________________________________________________________________________________   ____________________________________________________________________________________________  Medication Recommendations and Reminders  Applies to: All patients receiving prescriptions (written and/or electronic).  Medication Rules & Regulations: These rules and regulations exist for your safety and that of others. They are not flexible and neither are we. Dismissing or ignoring them will be considered "non-compliance" with medication therapy, resulting in complete and irreversible termination of such therapy. (See document titled "Medication Rules" for more details.) In all conscience, because of safety reasons, we cannot continue providing a therapy where the patient does not follow instructions.  Pharmacy of record:   Definition: This is the pharmacy where your electronic prescriptions will be sent.   We do not endorse any particular pharmacy.  You are not restricted in your choice of pharmacy.  The pharmacy listed in the electronic medical record should be the one where you want electronic prescriptions to be sent.  If you choose to change pharmacy, simply notify our nursing staff of your choice of  new pharmacy.  Recommendations:  Keep all of your pain medications in a safe place, under lock and key, even if you live alone.   After you fill your prescription, take 1 week's worth of pills and put them away in a safe place. You should keep a separate, properly labeled bottle for this purpose. The remainder should be kept in the original bottle. Use this as your primary supply, until it runs out. Once it's gone, then you know that you have 1 week's worth of medicine, and it is time to come in for a prescription refill. If you do this correctly, it is unlikely that you will ever run out of medicine.  To make sure that the above recommendation works, it is very important that you make sure your medication refill appointments are scheduled at least 1 week before you run out of medicine. To do this in an effective manner, make sure that you do not leave the office without scheduling your next medication management appointment. Always ask the nursing staff to show you in your prescription , when your medication will be running out. Then arrange for the receptionist to get you a return appointment, at least 7 days before you run out of medicine. Do not wait until you have 1 or 2 pills left, to come in. This is very poor planning and does not take into consideration that we may need to cancel appointments due to bad weather, sickness, or emergencies affecting our staff.  "Partial Fill": If for any reason  your pharmacy does not have enough pills/tablets to completely fill or refill your prescription, do not allow for a "partial fill". You will need a separate prescription to fill the remaining amount, which we will not provide. If the reason for the partial fill is your insurance, you will need to talk to the pharmacist about payment alternatives for the remaining tablets, but again, do not accept a partial fill.  Prescription refills and/or changes in medication(s):   Prescription refills, and/or changes  in dose or medication, will be conducted only during scheduled medication management appointments. (Applies to both, written and electronic prescriptions.)  No refills on procedure days. No medication will be changed or started on procedure days. No changes, adjustments, and/or refills will be conducted on a procedure day. Doing so will interfere with the diagnostic portion of the procedure.  No phone refills. No medications will be "called into the pharmacy".  No Fax refills.  No weekend refills.  No Holliday refills.  No after hours refills.  Remember:  Business hours are:  Monday to Thursday 8:00 AM to 4:00 PM Provider's Schedule: Dionisio David, NP - Appointments are:  Medication management: Monday to Thursday 8:00 AM to 4:00 PM Milinda Pointer, MD - Appointments are:  Medication management: Monday and Wednesday 8:00 AM to 4:00 PM Procedure day: Tuesday and Thursday 7:30 AM to 4:00 PM Gillis Santa, MD - Appointments are:  Medication management: Tuesday and Thursday 8:00 AM to 4:00 PM Procedure day: Monday and Wednesday 7:30 AM to 4:00 PM (Last update: 05/08/2017) ____________________________________________________________________________________________   ____________________________________________________________________________________________  CANNABIDIOL (AKA: CBD Oil or Pills)  Applies to: All patients receiving prescriptions of controlled substances (written and/or electronic).  General Information: Cannabidiol (CBD) was discovered in 49. It is one of some 113 identified cannabinoids in cannabis (Marijuana) plants, accounting for up to 40% of the plant's extract. As of 2018, preliminary clinical research on cannabidiol included studies of anxiety, cognition, movement disorders, and pain.  Cannabidiol is consummed in multiple ways, including inhalation of cannabis smoke or vapor, as an aerosol spray into the cheek, and by mouth. It may be supplied as CBD oil  containing CBD as the active ingredient (no added tetrahydrocannabinol (THC) or terpenes), a full-plant CBD-dominant hemp extract oil, capsules, dried cannabis, or as a liquid solution. CBD is thought not have the same psychoactivity as THC, and may affect the actions of THC. Studies suggest that CBD may interact with different biological targets, including cannabinoid receptors and other neurotransmitter receptors. As of 2018 the mechanism of action for its biological effects has not been determined.  In the [place], cannabidiol has a limited approval by the Food and Drug Administration (FDA) for treatment of only two types of epilepsy disorders. The side effects of long-term use of the drug include somnolence, decreased appetite, diarrhea, fatigue, malaise, weakness, sleeping problems, and others.  CBD remains a Schedule I drug prohibited for any use.  Legality: Some manufacturers ship CBD products nationally, an illegal action which the FDA has not enforced in 2018, with CBD remaining the subject of an FDA investigational new drug evaluation, and is not considered legal as a dietary supplement or food ingredient as of December 2018. Federal illegality has made it difficult historically to conduct research on CBD. CBD is openly sold in head shops and health food stores in some states where such sales have not been explicitly legalized.  Warning: Because it is not FDA approved for general use or treatment of pain, it is not required to  undergo the same manufacturing controls as prescription drugs.  This means that the available cannabidiol (CBD) may be contaminated with THC.  If this is the case, it will trigger a positive urine drug screen (UDS) test for cannabinoids (Marijuana).  Because a positive UDS for illicit substances is a violation of our medication agreement, your opioid analgesics (pain medicine) may be permanently discontinued. (Last update:  05/29/2017) ____________________________________________________________________________________________   ____________________________________________________________________________________________  Preparing for Procedure with Sedation  Instructions: . Oral Intake: Do not eat or drink anything for at least 8 hours prior to your procedure. . Transportation: Public transportation is not allowed. Bring an adult driver. The driver must be physically present in our waiting room before any procedure can be started. Marland Kitchen Physical Assistance: Bring an adult physically capable of assisting you, in the event you need help. This adult should keep you company at home for at least 6 hours after the procedure. . Blood Pressure Medicine: Take your blood pressure medicine with a sip of water the morning of the procedure. . Blood thinners: Notify our staff if you are taking any blood thinners. Depending on which one you take, there will be specific instructions on how and when to stop it. . Diabetics on insulin: Notify the staff so that you can be scheduled 1st case in the morning. If your diabetes requires high dose insulin, take only  of your normal insulin dose the morning of the procedure and notify the staff that you have done so. . Preventing infections: Shower with an antibacterial soap the morning of your procedure. . Build-up your immune system: Take 1000 mg of Vitamin C with every meal (3 times a day) the day prior to your procedure. Marland Kitchen Antibiotics: Inform the staff if you have a condition or reason that requires you to take antibiotics before dental procedures. . Pregnancy: If you are pregnant, call and cancel the procedure. . Sickness: If you have a cold, fever, or any active infections, call and cancel the procedure. . Arrival: You must be in the facility at least 30 minutes prior to your scheduled procedure. . Children: Do not bring children with you. . Dress appropriately: Bring dark clothing  that you would not mind if they get stained. . Valuables: Do not bring any jewelry or valuables.  Procedure appointments are reserved for interventional treatments only. Marland Kitchen No Prescription Refills. . No medication changes will be discussed during procedure appointments. . No disability issues will be discussed.  Reasons to call and reschedule or cancel your procedure: (Following these recommendations will minimize the risk of a serious complication.) . Surgeries: Avoid having procedures within 2 weeks of any surgery. (Avoid for 2 weeks before or after any surgery). . Flu Shots: Avoid having procedures within 2 weeks of a flu shots or . (Avoid for 2 weeks before or after immunizations). . Barium: Avoid having a procedure within 7-10 days after having had a radiological study involving the use of radiological contrast. (Myelograms, Barium swallow or enema study). . Heart attacks: Avoid any elective procedures or surgeries for the initial 6 months after a "Myocardial Infarction" (Heart Attack). . Blood thinners: It is imperative that you stop these medications before procedures. Let us know if you if you take any blood thinner.  . Infection: Avoid procedures during or within two weeks of an infection (including chest colds or gastrointestinal problems). Symptoms associated with infections include: Localized redness, fever, chills, night sweats or profuse sweating, burning sensation when voiding, cough, congestion, stuffiness, runny nose, sore throat,  diarrhea, nausea, vomiting, cold or Flu symptoms, recent or current infections. It is specially important if the infection is over the area that we intend to treat. Marland Kitchen Heart and lung problems: Symptoms that may suggest an active cardiopulmonary problem include: cough, chest pain, breathing difficulties or shortness of breath, dizziness, ankle swelling, uncontrolled high or unusually low blood pressure, and/or palpitations. If you are experiencing any of these  symptoms, cancel your procedure and contact your primary care physician for an evaluation.  Remember:  Regular Business hours are:  Monday to Thursday 8:00 AM to 4:00 PM  Provider's Schedule: Milinda Pointer, MD:  Procedure days: Tuesday and Thursday 7:30 AM to 4:00 PM  Gillis Santa, MD:  Procedure days: Monday and Wednesday 7:30 AM to 4:00 PM ____________________________________________________________________________________________   ____________________________________________________________________________________________  Medication Rules  Purpose: To inform patients, and their family members, of our rules and regulations.  Applies to: All patients receiving prescriptions (written or electronic).  Pharmacy of record: Pharmacy where electronic prescriptions will be sent. If written prescriptions are taken to a different pharmacy, please inform the nursing staff. The pharmacy listed in the electronic medical record should be the one where you would like electronic prescriptions to be sent.  Electronic prescriptions: In compliance with the [place] (STOP) Act of 2017 (Session Lanny Cramp [id number]), effective March 11, 2018, all controlled substances must be electronically prescribed. Calling prescriptions to the pharmacy will cease to exist.  Prescription refills: Only during scheduled appointments. Applies to all prescriptions.  NOTE: The following applies primarily to controlled substances (Opioid* Pain Medications).   Patient's responsibilities: 11. Pain Pills: Bring all pain pills to every appointment (except for procedure appointments). 12. Pill Bottles: Bring pills in original pharmacy bottle. Always bring the newest bottle. Bring bottle, even if empty. 13. Medication refills: You are responsible for knowing and keeping track of what medications you take and those you need refilled. The day before your appointment: write a list of  all prescriptions that need to be refilled. The day of the appointment: give the list to the admitting nurse. Prescriptions will be written only during appointments. If you forget a medication: it will not be "Called in", "Faxed", or "electronically sent". You will need to get another appointment to get these prescribed. No early refills. Do not call asking to have your prescription filled early. 14. Prescription Accuracy: You are responsible for carefully inspecting your prescriptions before leaving our office. Have the discharge nurse carefully go over each prescription with you, before taking them home. Make sure that your name is accurately spelled, that your address is correct. Check the name and dose of your medication to make sure it is accurate. Check the number of pills, and the written instructions to make sure they are clear and accurate. Make sure that you are given enough medication to last until your next medication refill appointment. 15. Taking Medication: Take medication as prescribed. When it comes to controlled substances, taking less pills or less frequently than prescribed is permitted and encouraged. Never take more pills than instructed. Never take medication more frequently than prescribed.  16. Inform other Doctors: Always inform, all of your healthcare providers, of all the medications you take. 17. Pain Medication from other Providers: You are not allowed to accept any additional pain medication from any other Doctor or Healthcare provider. There are two exceptions to this rule. (see below) In the event that you require additional pain medication, you are responsible for notifying us, as stated below.  18. Medication Agreement: You are responsible for carefully reading and following our Medication Agreement. This must be signed before receiving any prescriptions from our practice. Safely store a copy of your signed Agreement. Violations to the Agreement will result in no further  prescriptions. (Additional copies of our Medication Agreement are available upon request.) 19. Laws, Rules, & Regulations: All patients are expected to follow all Federal and [employer], [employer], Rules, [employer] [facility] [employer]. Ignorance of the Laws does not constitute a valid excuse. The use of any illegal substances is prohibited. 20. Adopted CDC guidelines & recommendations: Target dosing levels will be at or below 60 MME/day. Use of benzodiazepines** is not recommended.  Exceptions: There are only two exceptions to the rule of not receiving pain medications from other Healthcare Providers. 3. Exception #1 (Emergencies): In the event of an emergency (i.e.: accident requiring emergency care), you are allowed to receive additional pain medication. However, you are responsible for: As soon as you are able, call our office [phone], at any time of the day or night, and leave a message stating your name, the date and nature of the emergency, and the name and dose of the medication prescribed. In the event that your call is answered by a member of our staff, make sure to document and save the date, time, and the name of the person that took your information.  4. Exception #2 (Planned Surgery): In the event that you are scheduled by another doctor or dentist to have any type of surgery or procedure, you are allowed (for a period no longer than 30 days), to receive additional pain medication, for the acute post-op pain. However, in this case, you are responsible for picking up a copy of our "Post-op Pain Management for Surgeons" handout, and giving it to your surgeon or dentist. This document is available at our office, and does not require an appointment to obtain it. Simply go to our office during business hours (Monday-Thursday from 8:00 AM to 4:00 PM) (Friday 8:00 AM to 12:00 Noon) or if you have a scheduled appointment with [place], prior to your surgery, and ask for it by name. In addition, you will need to provide  [place] with your name, name of your surgeon, type of surgery, and date of procedure or surgery.  *Opioid medications include: morphine, codeine, oxycodone, oxymorphone, hydrocodone, hydromorphone, meperidine, tramadol, tapentadol, buprenorphine, fentanyl, methadone. **Benzodiazepine medications include: diazepam (Valium), alprazolam (Xanax), clonazepam (Klonopine), lorazepam (Ativan), clorazepate (Tranxene), chlordiazepoxide (Librium), estazolam (Prosom), oxazepam (Serax), temazepam (Restoril), triazolam (Halcion) (Last updated: 05/08/2017) ____________________________________________________________________________________________  ____________________________________________________________________________________________  CANNABIDIOL (AKA: CBD Oil or Pills)  Applies to: All patients receiving prescriptions of controlled substances (written and/or electronic).  General Information: Cannabidiol (CBD) was discovered in 15. It is one of some 113 identified cannabinoids in cannabis (Marijuana) plants, accounting for up to 40% of the plant's extract. As of 2018, preliminary clinical research on cannabidiol included studies of anxiety, cognition, movement disorders, and pain.  Cannabidiol is consummed in multiple ways, including inhalation of cannabis smoke or vapor, as an aerosol spray into the cheek, and by mouth. It may be supplied as CBD oil containing CBD as the active ingredient (no added tetrahydrocannabinol (THC) or terpenes), a full-plant CBD-dominant hemp extract oil, capsules, dried cannabis, or as a liquid solution. CBD is thought not have the same psychoactivity as THC, and may affect the actions of THC. Studies suggest that CBD may interact with different biological targets, including cannabinoid receptors and other neurotransmitter receptors. As of 2018 the mechanism  of action for its biological effects has not been determined.  In the [place], cannabidiol has a limited approval by  the Food and Drug Administration (FDA) for treatment of only two types of epilepsy disorders. The side effects of long-term use of the drug include somnolence, decreased appetite, diarrhea, fatigue, malaise, weakness, sleeping problems, and others.  CBD remains a Schedule I drug prohibited for any use.  Legality: Some manufacturers ship CBD products nationally, an illegal action which the FDA has not enforced in 2018, with CBD remaining the subject of an FDA investigational new drug evaluation, and is not considered legal as a dietary supplement or food ingredient as of December 2018. Federal illegality has made it difficult historically to conduct research on CBD. CBD is openly sold in head shops and health food stores in some states where such sales have not been explicitly legalized.  Warning: Because it is not FDA approved for general use or treatment of pain, it is not required to undergo the same manufacturing controls as prescription drugs.  This means that the available cannabidiol (CBD) may be contaminated with THC.  If this is the case, it will trigger a positive urine drug screen (UDS) test for cannabinoids (Marijuana).  Because a positive UDS for illicit substances is a violation of our medication agreement, your opioid analgesics (pain medicine) may be permanently discontinued. (Last update: 05/29/2017) ____________________________________________________________________________________________  ____________________________________________________________________________________________  General Risks and Possible Complications  Patient Responsibilities: It is important that you read this as it is part of your informed consent. It is our duty to inform you of the risks and possible complications associated with treatments offered to you. It is your responsibility as a patient to read this and to ask questions about anything that is not clear or that you believe was not covered in this  document.  Patient's Rights: You have the right to refuse treatment. You also have the right to change your mind, even after initially having agreed to have the treatment done. However, under this last option, if you wait until the last second to change your mind, you may be charged for the materials used up to that point.  Introduction: Medicine is not an Chief Strategy Officer. Everything in Medicine, including the lack of treatment(s), carries the potential for danger, harm, or loss (which is by definition: Risk). In Medicine, a complication is a secondary problem, condition, or disease that can aggravate an already existing one. All treatments carry the risk of possible complications. The fact that a side effects or complications occurs, does not imply that the treatment was conducted incorrectly. It must be clearly understood that these can happen even when everything is done following the highest safety standards.  No treatment: You can choose not to proceed with the proposed treatment alternative. The "PRO(s)" would include: avoiding the risk of complications associated with the therapy. The "CON(s)" would include: not getting any of the treatment benefits. These benefits fall under one of three categories: diagnostic; therapeutic; and/or palliative. Diagnostic benefits include: getting information which can ultimately lead to improvement of the disease or symptom(s). Therapeutic benefits are those associated with the successful treatment of the disease. Finally, palliative benefits are those related to the decrease of the primary symptoms, without necessarily curing the condition (example: decreasing the pain from a flare-up of a chronic condition, such as incurable terminal cancer).  General Risks and Complications: These are associated to most interventional treatments. They can occur alone, or in combination. They fall under one of the  following six (6) categories: no benefit or worsening of symptoms;  bleeding; infection; nerve damage; allergic reactions; and/or death. 1. No benefits or worsening of symptoms: In Medicine there are no guarantees, only probabilities. No healthcare provider can ever guarantee that a medical treatment will work, they can only state the probability that it may. Furthermore, there is always the possibility that the condition may worsen, either directly, or indirectly, as a consequence of the treatment. 2. Bleeding: This is more common if the patient is taking a blood thinner, either prescription or over the counter (example: Goody Powders, Fish oil, Aspirin, Garlic, etc.), or if suffering a condition associated with impaired coagulation (example: Hemophilia, cirrhosis of the liver, low platelet counts, etc.). However, even if you do not have one on these, it can still happen. If you have any of these conditions, or take one of these drugs, make sure to notify your treating physician. 3. Infection: This is more common in patients with a compromised immune system, either due to disease (example: diabetes, cancer, human immunodeficiency virus [HIV], etc.), or due to medications or treatments (example: therapies used to treat cancer and rheumatological diseases). However, even if you do not have one on these, it can still happen. If you have any of these conditions, or take one of these drugs, make sure to notify your treating physician. 4. Nerve Damage: This is more common when the treatment is an invasive one, but it can also happen with the use of medications, such as those used in the treatment of cancer. The damage can occur to small secondary nerves, or to large primary ones, such as those in the spinal cord and brain. This damage may be temporary or permanent and it may lead to impairments that can range from temporary numbness to permanent paralysis and/or brain death. 5. Allergic Reactions: Any time a substance or material comes in contact with our body, there is the  possibility of an allergic reaction. These can range from a mild skin rash (contact dermatitis) to a severe systemic reaction (anaphylactic reaction), which can result in death. 6. Death: In general, any medical intervention can result in death, most of the time due to an unforeseen complication. ____________________________________________________________________________________________  ____________________________________________________________________________________________  Preparing for Procedure with Sedation  Instructions: . Oral Intake: Do not eat or drink anything for at least 8 hours prior to your procedure. . Transportation: Public transportation is not allowed. Bring an adult driver. The driver must be physically present in our waiting room before any procedure can be started. Marland Kitchen Physical Assistance: Bring an adult physically capable of assisting you, in the event you need help. This adult should keep you company at home for at least 6 hours after the procedure. . Blood Pressure Medicine: Take your blood pressure medicine with a sip of water the morning of the procedure. . Blood thinners: Notify our staff if you are taking any blood thinners. Depending on which one you take, there will be specific instructions on how and when to stop it. . Diabetics on insulin: Notify the staff so that you can be scheduled 1st case in the morning. If your diabetes requires high dose insulin, take only  of your normal insulin dose the morning of the procedure and notify the staff that you have done so. . Preventing infections: Shower with an antibacterial soap the morning of your procedure. . Build-up your immune system: Take 1000 mg of Vitamin C with every meal (3 times a day) the day prior to your procedure. Marland Kitchen  Antibiotics: Inform the staff if you have a condition or reason that requires you to take antibiotics before dental procedures. . Pregnancy: If you are pregnant, call and cancel the  procedure. . Sickness: If you have a cold, fever, or any active infections, call and cancel the procedure. . Arrival: You must be in the facility at least 30 minutes prior to your scheduled procedure. . Children: Do not bring children with you. . Dress appropriately: Bring dark clothing that you would not mind if they get stained. . Valuables: Do not bring any jewelry or valuables.  Procedure appointments are reserved for interventional treatments only. Marland Kitchen No Prescription Refills. . No medication changes will be discussed during procedure appointments. . No disability issues will be discussed.  Reasons to call and reschedule or cancel your procedure: (Following these recommendations will minimize the risk of a serious complication.) . Surgeries: Avoid having procedures within 2 weeks of any surgery. (Avoid for 2 weeks before or after any surgery). . Flu Shots: Avoid having procedures within 2 weeks of a flu shots or . (Avoid for 2 weeks before or after immunizations). . Barium: Avoid having a procedure within 7-10 days after having had a radiological study involving the use of radiological contrast. (Myelograms, Barium swallow or enema study). . Heart attacks: Avoid any elective procedures or surgeries for the initial 6 months after a "Myocardial Infarction" (Heart Attack). . Blood thinners: It is imperative that you stop these medications before procedures. Let us know if you if you take any blood thinner.  . Infection: Avoid procedures during or within two weeks of an infection (including chest colds or gastrointestinal problems). Symptoms associated with infections include: Localized redness, fever, chills, night sweats or profuse sweating, burning sensation when voiding, cough, congestion, stuffiness, runny nose, sore throat, diarrhea, nausea, vomiting, cold or Flu symptoms, recent or current infections. It is specially important if the infection is over the area that we intend to  treat. Marland Kitchen Heart and lung problems: Symptoms that may suggest an active cardiopulmonary problem include: cough, chest pain, breathing difficulties or shortness of breath, dizziness, ankle swelling, uncontrolled high or unusually low blood pressure, and/or palpitations. If you are experiencing any of these symptoms, cancel your procedure and contact your primary care physician for an evaluation.  Remember:  Regular Business hours are:  Monday to Thursday 8:00 AM to 4:00 PM  Provider's Schedule: Milinda Pointer, MD:  Procedure days: Tuesday and Thursday 7:30 AM to 4:00 PM  Gillis Santa, MD:  Procedure days: Monday and Wednesday 7:30 AM to 4:00 PM ____________________________________________________________________________________________

## 2018-03-23 NOTE — Progress Notes (Signed)
Safety precautions to be maintained throughout the outpatient stay will include: orient to surroundings, keep bed in low position, maintain call bell within reach at all times, provide assistance with transfer out of bed and ambulation.  

## 2018-04-07 ENCOUNTER — Other Ambulatory Visit: Payer: Self-pay

## 2018-04-07 ENCOUNTER — Encounter: Payer: Self-pay | Admitting: Pain Medicine

## 2018-04-07 ENCOUNTER — Ambulatory Visit
Admission: RE | Admit: 2018-04-07 | Discharge: 2018-04-07 | Disposition: A | Discharge status: Home / Self Care | Payer: Self-pay | Source: Ambulatory Visit | Attending: Pain Medicine | Admitting: Pain Medicine

## 2018-04-07 ENCOUNTER — Ambulatory Visit (HOSPITAL_BASED_OUTPATIENT_CLINIC_OR_DEPARTMENT_OTHER): Payer: Self-pay | Admitting: Pain Medicine

## 2018-04-07 VITALS — BP 148/92 | HR 61 | Temp 98.1°F | Resp 20 | Ht 76.0 in | Wt 220.0 lb

## 2018-04-07 DIAGNOSIS — G8929 Other chronic pain: Secondary | ICD-10-CM | POA: Insufficient documentation

## 2018-04-07 DIAGNOSIS — M25551 Pain in right hip: Secondary | ICD-10-CM | POA: Insufficient documentation

## 2018-04-07 DIAGNOSIS — G894 Chronic pain syndrome: Secondary | ICD-10-CM | POA: Insufficient documentation

## 2018-04-07 DIAGNOSIS — M1611 Unilateral primary osteoarthritis, right hip: Secondary | ICD-10-CM

## 2018-04-07 MED ORDER — LACTATED RINGERS IV SOLN
1000.0000 mL | Freq: Once | INTRAVENOUS | Status: AC
  Administered 2018-04-07: 1000 mL via INTRAVENOUS

## 2018-04-07 MED ORDER — METHYLPREDNISOLONE ACETATE 80 MG/ML IJ SUSP
80.0000 mg | Freq: Once | INTRAMUSCULAR | Status: AC
  Administered 2018-04-07: 80 mg via INTRA_ARTICULAR
  Filled 2018-04-07: qty 1

## 2018-04-07 MED ORDER — MIDAZOLAM HCL 5 MG/5ML IJ SOLN
1.0000 mg | INTRAMUSCULAR | Status: DC | PRN
  Administered 2018-04-07: 2 mg via INTRAVENOUS
  Filled 2018-04-07: qty 5

## 2018-04-07 MED ORDER — ROPIVACAINE HCL 2 MG/ML IJ SOLN
4.0000 mL | Freq: Once | INTRAMUSCULAR | Status: AC
  Administered 2018-04-07: 4 mL via INTRA_ARTICULAR
  Filled 2018-04-07: qty 10

## 2018-04-07 MED ORDER — LIDOCAINE HCL 2 % IJ SOLN
20.0000 mL | Freq: Once | INTRAMUSCULAR | Status: AC
  Administered 2018-04-07: 400 mg
  Filled 2018-04-07: qty 40

## 2018-04-07 MED ORDER — FENTANYL CITRATE (PF) 100 MCG/2ML IJ SOLN
25.0000 ug | INTRAMUSCULAR | Status: DC | PRN
  Administered 2018-04-07: 50 ug via INTRAVENOUS
  Filled 2018-04-07: qty 2

## 2018-04-07 MED ORDER — IOPAMIDOL (ISOVUE-M 200) INJECTION 41%
10.0000 mL | Freq: Once | INTRAMUSCULAR | Status: AC
  Administered 2018-04-07: 10 mL via EPIDURAL
  Filled 2018-04-07: qty 10

## 2018-04-07 MED ORDER — TRAMADOL HCL 50 MG PO TABS: 50.0000 mg | ORAL_TABLET | Freq: Four times a day (QID) | ORAL | 0 refills | Status: DC

## 2018-04-07 NOTE — Progress Notes (Signed)
Patient's Name: Mark [name]  MRN: [id number]  Referring Provider: Ladell Pier, MD  DOB: 06/07/1965  PCP: Ladell Pier, MD  DOS: 04/07/2018  Note by: [name], MD  Service setting: Ambulatory outpatient  Specialty: Interventional Pain Management  Patient type: Established  Location: [facility] (AMB) Pain Management Facility  Visit type: Interventional Procedure   Primary Reason for Visit: Interventional Pain Management Treatment. CC: Hip Pain (right)  Procedure:          Anesthesia, Analgesia, Anxiolysis:  Type: Intra-Articular Hip Injection #1  Primary Purpose: Diagnostic Region: Posterolateral hip joint area. Level: Lower pelvic and hip joint level. Target Area: Superior aspect of the hip joint cavity, going thru the superior portion of the capsular ligament. Approach: Posterolateral approach. Laterality: Right-Sided  Type: Moderate (Conscious) Sedation combined with Local Anesthesia Indication(s): Analgesia and Anxiety Route: Intravenous (IV) IV Access: Secured Sedation: Meaningful verbal contact was maintained at all times during the procedure  Local Anesthetic: Lidocaine 1-2%  Position: Lateral Decubitus with bad side up Prepped Area: Entire Posterolateral hip area. Prepping solution: ChloraPrep (2% chlorhexidine gluconate and 70% isopropyl alcohol)   Indications: 1. Chronic hip pain (Right)   2. Osteoarthritis of hip (Right)    Pain Score: Pre-procedure: 6 /10 Post-procedure: 0-No pain/10  Controlled Substance Pharmacotherapy Assessment REMS (Risk Evaluation and Mitigation Strategy)  Analgesic: Tramadol 100 mg every 6 hours (400 mg/day of tramadol) MME/day: 40 mg/day.  No notes on file Pharmacokinetics: Liberation and absorption (onset of action): WNL Distribution (time to peak effect): WNL Metabolism and excretion (duration of action): WNL         Pharmacodynamics: Desired effects: Analgesia: Mark [name] reports >50% benefit. Functional  ability: Patient reports that medication allows him to accomplish basic ADLs Clinically meaningful improvement in function (CMIF): Sustained CMIF goals met Perceived effectiveness: Described as relatively effective, allowing for increase in activities of daily living (ADL) Undesirable effects: Side-effects or Adverse reactions: None reported Monitoring: [place] PMP: Online review of the past 75-monthperiod conducted. Non-compliant.  The patient found on 06/19/17 to be taking a benzodiazepine that had not been prescribed to him. Last UDS on record: Summary  Date Value Ref Range Status  06/19/2017 FINAL  Final    Comment:    ==================================================================== TOXASSURE SELECT 13 (MW) ==================================================================== Test                             Result       Flag       Units Drug Present and Declared for Prescription Verification   Tramadol                       283          EXPECTED   ng/mg creat   O-Desmethyltramadol            140          EXPECTED   ng/mg creat   N-Desmethyltramadol            167          EXPECTED   ng/mg creat    Source of tramadol is a prescription medication.    O-desmethyltramadol and N-desmethyltramadol are expected    metabolites of tramadol. Drug Present not Declared for Prescription Verification   Alprazolam                     101  UNEXPECTED ng/mg creat   Alpha-hydroxyalprazolam        164          UNEXPECTED ng/mg creat    Source of alprazolam is a scheduled prescription medication.    Alpha-hydroxyalprazolam is an expected metabolite of alprazolam. ==================================================================== Test                      Result    Flag   Units      Ref Range   Creatinine              371              mg/dL      >=20 ==================================================================== Declared Medications:  The flagging and interpretation on this report are  based on the  following declared medications.  Unexpected results may arise from  inaccuracies in the declared medications.  **Note: The testing scope of this panel includes these medications:  Tramadol  **Note: The testing scope of this panel does not include following  reported medications:  Albuterol  Atorvastatin  Cyclobenzaprine  Gabapentin  Losartan (Losartan Potassium)  Metoprolol  Tadalafil  Tamsulosin  Trazodone ==================================================================== For clinical consultation, please call [phone]. ====================================================================    UDS interpretation: Noncompliant:  unexpected findings showing a benzodiazepine that upon checking the PMP, had not been prescribed to him. Medication Assessment Form: Discrepancies found between patient's report and information collected Treatment compliance: Non-compliant Risk Assessment Profile: Aberrant behavior: See prior evaluations. None observed or detected today Comorbid factors increasing risk of overdose: See prior notes. No additional risks detected today Risk of substance use disorder (SUD): Very High  ORT Scoring interpretation table:  Score <3 = Low Risk for SUD  Score between 4-7 = Moderate Risk for SUD  Score >8 = High Risk for Opioid Abuse   Pharmacologic Plan: We will keep a very close eye on him due to the above mentioned abnormal UDS.             Pre-op Assessment:  Mark [name] is a 53 y.o. (year old), male patient, seen today for interventional treatment. He  has a past surgical history that includes Back surgery (1995, 1999); Neck surgery (2000); Nasal sinus surgery; left heart catheterization with coronary angiogram (N/A, 01/13/2014); Subacromial decompression (Right, 11/16/2014); Nissen fundoplication; 24 hour ph study (N/A, 01/16/2015); Upper gastrointestinal endoscopy; Colonoscopy (1998); and Shoulder surgery (Right). Mr. Iannone has a current  medication list which includes the following prescription(s): albuterol, cyclobenzaprine, famotidine, gabapentin, metoprolol tartrate, omeprazole, promethazine, and trazodone, and the following Facility-Administered Medications: fentanyl and midazolam. His primarily concern today is the Hip Pain (right)  Initial Vital Signs:  Pulse/HCG Rate: 61ECG Heart Rate: (!) 53 Temp: 97.8 F (36.6 C) Resp: 18 BP: (!) 148/86 SpO2: 100 %  BMI: Estimated body mass index is 26.78 kg/m as calculated from the following:   Height as of this encounter: '6\' 4"'$  (1.93 m).   Weight as of this encounter: 220 lb (99.8 kg).  Risk Assessment: Allergies: Reviewed. He is allergic to cozaar [losartan] and lisinopril.  Allergy Precautions: None required Coagulopathies: Reviewed. None identified.  Blood-thinner therapy: None at this time Active Infection(s): Reviewed. None identified. Mr. Kingry is afebrile  Site Confirmation: Mr. Creque was asked to confirm the procedure and laterality before marking the site Procedure checklist: Completed Consent: Before the procedure and under the influence of no sedative(s), amnesic(s), or anxiolytics, the patient was informed of the treatment options, risks and possible complications. To fulfill our  ethical and legal obligations, as recommended by the American Medical Association's Code of Ethics, I have informed the patient of my clinical impression; the nature and purpose of the treatment or procedure; the risks, benefits, and possible complications of the intervention; the alternatives, including doing nothing; the risk(s) and benefit(s) of the alternative treatment(s) or procedure(s); and the risk(s) and benefit(s) of doing nothing. The patient was provided information about the general risks and possible complications associated with the procedure. These may include, but are not limited to: failure to achieve desired goals, infection, bleeding, organ or nerve damage, allergic  reactions, paralysis, and death. In addition, the patient was informed of those risks and complications associated to the procedure, such as failure to decrease pain; infection; bleeding; organ or nerve damage with subsequent damage to sensory, motor, and/or autonomic systems, resulting in permanent pain, numbness, and/or weakness of one or several areas of the body; allergic reactions; (i.e.: anaphylactic reaction); and/or death. Furthermore, the patient was informed of those risks and complications associated with the medications. These include, but are not limited to: allergic reactions (i.e.: anaphylactic or anaphylactoid reaction(s)); adrenal axis suppression; blood sugar elevation that in diabetics may result in ketoacidosis or comma; water retention that in patients with history of congestive heart failure may result in shortness of breath, pulmonary edema, and decompensation with resultant heart failure; weight gain; swelling or edema; medication-induced neural toxicity; particulate matter embolism and blood vessel occlusion with resultant organ, and/or nervous system infarction; and/or aseptic necrosis of one or more joints. Finally, the patient was informed that Medicine is not an exact science; therefore, there is also the possibility of unforeseen or unpredictable risks and/or possible complications that may result in a catastrophic outcome. The patient indicated having understood very clearly. We have given the patient no guarantees and we have made no promises. Enough time was given to the patient to ask questions, all of which were answered to the patient's satisfaction. Mr. Colgan has indicated that he wanted to continue with the procedure. Attestation: I, the ordering provider, attest that I have discussed with the patient the benefits, risks, side-effects, alternatives, likelihood of achieving goals, and potential problems during recovery for the procedure that I have provided informed  consent. Date  Time: 04/07/2018  9:51 AM  Pre-Procedure Preparation:  Monitoring: As per clinic protocol. Respiration, ETCO2, SpO2, BP, heart rate and rhythm monitor placed and checked for adequate function Safety Precautions: Patient was assessed for positional comfort and pressure points before starting the procedure. Time-out: I initiated and conducted the "Time-out" before starting the procedure, as per protocol. The patient was asked to participate by confirming the accuracy of the "Time Out" information. Verification of the correct person, site, and procedure were performed and confirmed by me, the nursing staff, and the patient. "Time-out" conducted as per Joint Commission's Universal Protocol (UP.01.01.01). Time: 1052  Description of Procedure:          Safety Precautions: Aspiration looking for blood return was conducted prior to all injections. At no point did we inject any substances, as a needle was being advanced. No attempts were made at seeking any paresthesias. Safe injection practices and needle disposal techniques used. Medications properly checked for expiration dates. SDV (single dose vial) medications used. Description of the Procedure: Protocol guidelines were followed. The patient was placed in position over the fluoroscopy table. The target area was identified and the area prepped in the usual manner. Skin & deeper tissues infiltrated with local anesthetic. Appropriate amount of time allowed  to pass for local anesthetics to take effect. The procedure needles were then advanced to the target area. Proper needle placement secured. Negative aspiration confirmed. Solution injected in intermittent fashion, asking for systemic symptoms every 0.5cc of injectate. The needles were then removed and the area cleansed, making sure to leave some of the prepping solution back to take advantage of its long term bactericidal properties. Vitals:   04/07/18 1104 04/07/18 1114 04/07/18 1124  04/07/18 1134  BP: (!) 148/93 140/85 (!) 151/95 (!) 148/92  Pulse:      Resp: 14 (!) _0 Temp:  98.1 F (36.7 C)  98.1 F (36.7 C)  TempSrc:      SpO2: 95% 98% 99% 99%  Weight:      Height:        Start Time: 1053 hrs. End Time: 1103 hrs. Materials:  Needle(s) Type: Spinal Needle Gauge: 22G Length: 5.0-in Medication(s): Please see orders for medications and dosing details.  Imaging Guidance (Non-Spinal):          Type of Imaging Technique: Fluoroscopy Guidance (Non-Spinal) Indication(s): Assistance in needle guidance and placement for procedures requiring needle placement in or near specific anatomical locations not easily accessible without such assistance. Exposure Time: Please see nurses notes. Contrast: Before injecting any contrast, we confirmed that the patient did not have an allergy to iodine, shellfish, or radiological contrast. Once satisfactory needle placement was completed at the desired level, radiological contrast was injected. Contrast injected under live fluoroscopy. No contrast complications. See chart for type and volume of contrast used. Fluoroscopic Guidance: I was personally present during the use of fluoroscopy. "Tunnel Vision Technique" used to obtain the best possible view of the target area. Parallax error corrected before commencing the procedure. "Direction-depth-direction" technique used to introduce the needle under continuous pulsed fluoroscopy. Once target was reached, antero-posterior, oblique, and lateral fluoroscopic projection used confirm needle placement in all planes. Images permanently stored in EMR. Interpretation: I personally interpreted the imaging intraoperatively. Adequate needle placement confirmed in multiple planes. Appropriate spread of contrast into desired area was observed. No evidence of afferent or efferent intravascular uptake. Permanent images saved into the patient's record.  Antibiotic Prophylaxis:   Anti-infectives (From  admission, onward)   None     Indication(s): None identified  Post-operative Assessment:  Post-procedure Vital Signs:  Pulse/HCG Rate: 6163 Temp: 98.1 F (36.7 C) Resp: 20 BP: (!) 148/92 SpO2: 99 %  EBL: None  Complications: No immediate post-treatment complications observed by team, or reported by patient.  Note: The patient tolerated the entire procedure well. A repeat set of vitals were taken after the procedure and the patient was kept under observation following institutional policy, for this type of procedure. Post-procedural neurological assessment was performed, showing return to baseline, prior to discharge. The patient was provided with post-procedure discharge instructions, including a section on how to identify potential problems. Should any problems arise concerning this procedure, the patient was given instructions to immediately contact us, at any time, without hesitation. In any case, we plan to contact the patient by telephone for a follow-up status report regarding this interventional procedure.  Comments:  No additional relevant information.  Plan of Care    Imaging Orders     DG C-Arm 1-60 Min-No Report  Procedure Orders     HIP INJECTION  Medications ordered for procedure: Meds ordered this encounter  Medications  . iopamidol (ISOVUE-M) 41 % intrathecal injection 10 mL    Must be Myelogram-compatible. If not available, you  may substitute with a water-soluble, non-ionic, hypoallergenic, myelogram-compatible radiological contrast medium.  Marland Kitchen lidocaine (XYLOCAINE) 2 % (with pres) injection 400 mg  . midazolam (VERSED) 5 MG/5ML injection 1-2 mg    Make sure Flumazenil is available in the pyxis when using this medication. If oversedation occurs, administer 0.2 mg IV over 15 sec. If after 45 sec no response, administer 0.2 mg again over 1 min; may repeat at 1 min intervals; not to exceed 4 doses (1 mg)  . fentaNYL (SUBLIMAZE) injection 25-50 mcg    Make sure  Narcan is available in the pyxis when using this medication. In the event of respiratory depression (RR< 8/min): Titrate NARCAN (naloxone) in increments of 0.1 to 0.2 mg IV at 2-3 minute intervals, until desired degree of reversal.  . lactated ringers infusion 1,000 mL  . ropivacaine (PF) 2 mg/mL (0.2%) (NAROPIN) injection 4 mL  . methylPREDNISolone acetate (DEPO-MEDROL) injection 80 mg  . DISCONTD: traMADol (ULTRAM) 50 MG tablet    Sig: Take 1-2 tablets (50-100 mg total) by mouth 4 (four) times daily for 30 days. Must last 30 days.    Dispense:  240 tablet    Refill:  0    [place] STOP ACT - Not applicable. Fill one day early if pharmacy is closed on scheduled refill date. Do not fill until: 04/22/18. Must last 30 days. To last until: 05/22/18.   Medications administered: We administered iopamidol, lidocaine, midazolam, fentaNYL, lactated ringers, ropivacaine (PF) 2 mg/mL (0.2%), and methylPREDNISolone acetate.  See the medical record for exact dosing, route, and time of administration.  Disposition: Discharge home  Discharge Date & Time: 04/07/2018; 1135 hrs.   Physician-requested Follow-up: Return for post-procedure eval (2 wks), w/ Dr. Dossie Arbour.  Future Appointments  Date Time Provider [place]  04/20/2018 10:45 AM Vevelyn Francois, NP [facility] None  05/06/2018 10:15 AM Milinda Pointer, MD [facility] None   Primary Care Physician: Ladell Pier, MD Location: [facility] Outpatient Pain Management Facility Note by: [name], MD Date: 04/07/2018; Time: 12:08 PM  Disclaimer:  Medicine is not an Chief Strategy Officer. The only guarantee in medicine is that nothing is guaranteed. It is important to note that the decision to proceed with this intervention was based on the information collected from the patient. The Data and conclusions were drawn from the patient's questionnaire, the interview, and the physical examination. Because the information was provided in large part by the  patient, it cannot be guaranteed that it has not been purposely or unconsciously manipulated. Every effort has been made to obtain as much relevant data as possible for this evaluation. It is important to note that the conclusions that lead to this procedure are derived in large part from the available data. Always take into account that the treatment will also be dependent on availability of resources and existing treatment guidelines, considered by other Pain Management Practitioners as being common knowledge and practice, at the time of the intervention. For Medico-Legal purposes, it is also important to point out that variation in procedural techniques and pharmacological choices are the acceptable norm. The indications, contraindications, technique, and results of the above procedure should only be interpreted and judged by a Board-Certified Interventional Pain Specialist with extensive familiarity and expertise in the same exact procedure and technique.

## 2018-04-07 NOTE — Patient Instructions (Signed)

## 2018-04-08 ENCOUNTER — Telehealth: Payer: Self-pay | Admitting: *Deleted

## 2018-04-08 MED FILL — METOPROLOL SUCCINATE ER 100: 100 | 30 days supply | Qty: 30 | Fill #0

## 2018-04-08 MED FILL — LOSARTAN POTASSIUM 100 MG T: 100 | 30 days supply | Qty: 30 | Fill #0

## 2018-04-08 NOTE — Telephone Encounter (Signed)
No answer. LVM to return call for any problems.

## 2018-04-14 MED FILL — traZODone HCL 50 MG TABS: 50 | 30 days supply | Qty: 60 | Fill #1

## 2018-04-14 MED FILL — GABAPENTIN 300 MG CAPSULE: 300 | 30 days supply | Qty: 90 | Fill #1

## 2018-04-14 MED FILL — PROMETHAZINE 12.5 MG TABLET: 12.5 | 30 days supply | Qty: 10 | Fill #1

## 2018-04-20 ENCOUNTER — Encounter: Payer: Self-pay | Admitting: Nurse Practitioner

## 2018-04-20 ENCOUNTER — Other Ambulatory Visit: Payer: Self-pay

## 2018-04-20 ENCOUNTER — Ambulatory Visit: Payer: Self-pay | Attending: Nurse Practitioner | Admitting: Nurse Practitioner

## 2018-04-20 VITALS — BP 153/100 | HR 84 | Temp 98.4°F | Ht 76.8 in | Wt 210.0 lb

## 2018-04-20 DIAGNOSIS — G894 Chronic pain syndrome: Secondary | ICD-10-CM | POA: Insufficient documentation

## 2018-04-20 DIAGNOSIS — M1611 Unilateral primary osteoarthritis, right hip: Secondary | ICD-10-CM | POA: Insufficient documentation

## 2018-04-20 DIAGNOSIS — G8929 Other chronic pain: Secondary | ICD-10-CM | POA: Insufficient documentation

## 2018-04-20 DIAGNOSIS — M7918 Myalgia, other site: Secondary | ICD-10-CM | POA: Insufficient documentation

## 2018-04-20 DIAGNOSIS — M47816 Spondylosis without myelopathy or radiculopathy, lumbar region: Secondary | ICD-10-CM | POA: Insufficient documentation

## 2018-04-20 DIAGNOSIS — M79605 Pain in left leg: Secondary | ICD-10-CM | POA: Insufficient documentation

## 2018-04-20 DIAGNOSIS — M79604 Pain in right leg: Secondary | ICD-10-CM | POA: Insufficient documentation

## 2018-04-20 DIAGNOSIS — Z79891 Long term (current) use of opiate analgesic: Secondary | ICD-10-CM | POA: Insufficient documentation

## 2018-04-20 MED ORDER — TRAMADOL HCL 50 MG PO TABS: 50.0000 mg | ORAL_TABLET | Freq: Four times a day (QID) | ORAL | 0 refills | Status: DC

## 2018-04-20 NOTE — Progress Notes (Addendum)
Patient's Name: Mark [name]  MRN: [id number]  Referring Provider: Ladell Pier, MD  DOB: 1965-06-25  PCP: Ladell Pier, MD  DOS: 04/20/2018  Note by: Dionisio David, NP  Service setting: Ambulatory outpatient  Specialty: Interventional Pain Management  Location: [facility] (AMB) Pain Management Facility    Patient type: Established   HPI  Reason for Visit: [name] is a 53 y.o. year old, male patient, who comes today with a chief complaint of Hip Pain Last Appointment: He was last seen by me on 03/12/2018. Pain Assessment: Today, [name] describes the severity of the Chronic pain as a 7 /10. He indicates the location/referral of the pain to be Hip Right/pain radiaties down right leg. Onset was: More than a month ago. The quality of pain is described as Aching. Temporal description, or timing of pain is: Constant. Possible modifying factors: medications. [name] describes the pain effects on ADL as: unable to do much of anything.  [name]  height is 6' 4.8" (1.951 m) and weight is 210 lb (95.3 kg). His temperature is 98.4 F (36.9 C). His blood pressure is 153/100 (abnormal) and his pulse is 84. His oxygen saturation is 100%. He does continue use gabapentin as directed but would like to decrease his dose down to twice daily at some point..  He does not feel any less effects on the lower dose.    Controlled Substance Pharmacotherapy Assessment REMS (Risk Evaluation and Mitigation Strategy)  Tramadol 100 mg 4 times a day (400 mg/dayof tramadol)  MME/day:26m/day.  BChauncey Fischer RN  04/21/2018  9:01 AM  Signed Nursing Pain Medication Assessment:  Safety precautions to be maintained throughout the outpatient stay will include: orient to surroundings, keep bed in low position, maintain call bell within reach at all times, provide assistance with transfer out of bed and ambulation.  Medication Inspection Compliance: Pill count conducted under aseptic conditions, in  front of the patient. Neither the pills nor the bottle was removed from the patient's sight at any time. Once count was completed pills were immediately returned to the patient in their original bottle.  Medication: Tramadol (Ultram) Pill/Patch Count: 8 of 240 pills remain Pill/Patch Appearance: Markings consistent with prescribed medication Bottle Appearance: Standard pharmacy container. Clearly labeled. Filled Date: 1 / 161/ 2020 Last Medication intake:  Today   Pharmacokinetics: Liberation and absorption (onset of action): WNL Distribution (time to peak effect): WNL Metabolism and excretion (duration of action): WNL         Pharmacodynamics: Desired effects: Analgesia: Mr. MFluddreports >50% benefit. Functional ability: Patient reports that medication allows him to accomplish basic ADLs Clinically meaningful improvement in function (CMIF): Sustained CMIF goals met Perceived effectiveness: Described as relatively effective, allowing for increase in activities of daily living (ADL) Undesirable effects: Side-effects or Adverse reactions: None reported Monitoring: [place] PMP: Online review of the past 138-montheriod conducted. Compliant with practice rules and regulations Last UDS on record: Summary  Date Value Ref Range Status  06/19/2017 FINAL  Final    Comment:    ==================================================================== TOXASSURE SELECT 13 (MW) ==================================================================== Test                             Result       Flag       Units Drug Present and Declared for Prescription Verification   Tramadol  283          EXPECTED   ng/mg creat   O-Desmethyltramadol            140          EXPECTED   ng/mg creat   N-Desmethyltramadol            167          EXPECTED   ng/mg creat    Source of tramadol is a prescription medication.    O-desmethyltramadol and N-desmethyltramadol are expected    metabolites of  tramadol. Drug Present not Declared for Prescription Verification   Alprazolam                     101          UNEXPECTED ng/mg creat   Alpha-hydroxyalprazolam        164          UNEXPECTED ng/mg creat    Source of alprazolam is a scheduled prescription medication.    Alpha-hydroxyalprazolam is an expected metabolite of alprazolam. ==================================================================== Test                      Result    Flag   Units      Ref Range   Creatinine              371              mg/dL      >=20 ==================================================================== Declared Medications:  The flagging and interpretation on this report are based on the  following declared medications.  Unexpected results may arise from  inaccuracies in the declared medications.  **Note: The testing scope of this panel includes these medications:  Tramadol  **Note: The testing scope of this panel does not include following  reported medications:  Albuterol  Atorvastatin  Cyclobenzaprine  Gabapentin  Losartan (Losartan Potassium)  Metoprolol  Tadalafil  Tamsulosin  Trazodone ==================================================================== For clinical consultation, please call [phone]. ====================================================================    UDS interpretation: Non-Compliant Patient reminded of the CDC guidelines recommending to stay away from the sedatives & benzodiazepines due to the risk of respiratory depression and death. Medication Assessment Form: Reviewed. Patient indicates being compliant with therapy Treatment compliance: Compliant Risk Assessment Profile: Aberrant behavior: See initial evaluations. None observed or detected today Comorbid factors increasing risk of overdose: See initial evaluation. No additional risks detected today Opioid risk tool (ORT):  Opioid Risk  04/20/2018  Alcohol 3  Illegal Drugs 0  Rx Drugs 0  Alcohol 3   Illegal Drugs 0  Rx Drugs 0  Age between 16-45 years  0  History of Preadolescent Sexual Abuse 0  Psychological Disease 2  ADD Negative  OCD Negative  Bipolar Negative  Depression 1  Opioid Risk Tool Scoring 9  Opioid Risk Interpretation High Risk    ORT Scoring interpretation table:  Score <3 = Low Risk for SUD  Score between 4-7 = Moderate Risk for SUD  Score >8 = High Risk for Opioid Abuse   Risk of substance use disorder (SUD): High  Risk Mitigation Strategies:  Patient Counseling: Covered Patient-Prescriber Agreement (PPA): Present and active  Notification to other healthcare providers: Done  Pharmacologic Plan: No change in therapy, at this time.             ROS  Constitutional: Denies any fever or chills Gastrointestinal: No reported hemesis, hematochezia, vomiting, or acute GI distress Musculoskeletal: Denies any acute onset  joint swelling, redness, loss of ROM, or weakness Neurological: No reported episodes of acute onset apraxia, aphasia, dysarthria, agnosia, amnesia, paralysis, loss of coordination, or loss of consciousness  Medication Review  albuterol, cyclobenzaprine, famotidine, gabapentin, metoprolol tartrate, omeprazole, promethazine, traMADol, and traZODone  History Review  Allergy: Mr. Febles is allergic to cozaar [losartan] and lisinopril. Drug: Mr. Getter  reports no history of drug use. Alcohol:  reports current alcohol use. Tobacco:  reports that he has been smoking cigarettes. He has a 15.00 pack-year smoking history. He has never used smokeless tobacco. Social: Mr. Carlberg  reports that he has been smoking cigarettes. He has a 15.00 pack-year smoking history. He has never used smokeless tobacco. He reports current alcohol use. He reports that he does not use drugs. Medical:  has a past medical history of Alcohol withdrawal ([place]), Allergy, Anxiety (Dx 2014), COPD (chronic obstructive pulmonary disease) ([place]), Drug abuse ([place]), Enlarged  prostate, ETOH abuse, GERD (gastroesophageal reflux disease) (Dx 2003), Headache(784.0), Hepatitis, Hyperlipidemia (Dx 2000), Hypertension (Dx 2011), IBS (irritable bowel syndrome), Irregular heart beat, Long Q-T syndrome (01/23/2014), Mental disorder, Neuromuscular disorder ([place]), Neuropathy (06/07/2013), Seizure due to alcohol withdrawal ([place]) (01/23/2014), Shortness of breath, and Withdrawal seizures ([place]) (2014). Surgical: Mr. Crispo  has a past surgical history that includes Back surgery (1995, 1999); Neck surgery (2000); Nasal sinus surgery; left heart catheterization with coronary angiogram (N/A, 01/13/2014); Subacromial decompression (Right, 11/16/2014); Nissen fundoplication; 24 hour ph study (N/A, 01/16/2015); Upper gastrointestinal endoscopy; Colonoscopy (1998); and Shoulder surgery (Right). Family: family history includes Alcohol abuse in his father and paternal grandfather; Breast cancer in his paternal grandmother; Colon polyps in his mother; Hypertension in his mother; Lung cancer in his father. Problem List: Mr. Mccollum has Chronic low back pain (Secondary source of pain) (Bilateral) (R>L); Chronic pain syndrome; Chronic shoulder pain (Right); Chronic radicular low back pain [facility] source of pain) (Right) (to calf); Chronic neck pain (Bilateral)(R>L); Cervical fusion syndrome; Failed back surgical syndrome; Chronic knee pain  (Bilateral) (R>L); Musculoskeletal pain; Chronic lower extremity pain (Bilateral) (R>L); Lumbar facet syndrome (Bilateral) (R>L); and Chronic sacroiliac joint pain (Bilateral) (R>L) on their pertinent problem list.  Lab Review  Kidney Function Lab Results  Component Value Date   BUN 6 01/03/2018   CREATININE 0.65 01/03/2018   BCR 12 04/09/2017   GFRAA >60 01/03/2018   GFRNONAA >60 01/03/2018  Liver Function Lab Results  Component Value Date   AST 43 (H) 01/03/2018   ALT 36 01/03/2018   ALBUMIN 3.8 01/03/2018  Note: Above Lab results reviewed.  Imaging  Review  DG C-Arm 1-60 Min-No Report Fluoroscopy was utilized by the requesting physician.  No radiographic  interpretation.  Note: Above imaging results reviewed.       Evaluation of last interventional procedure  04/07/2018 Procedure: Right-sided intra-articular hip injection Pre-procedure pain score:  6/10 Post-procedure pain score: 0/10         Influential Factors: Intra-procedural challenges: None observed.         Reported side-effects: None.        Post-procedural adverse reactions or complications: None reported         Sedation: Please see nurses note for DOS. When no sedatives are used, the analgesic levels obtained are directly associated to the effectiveness of the local anesthetics. However, when sedation is provided, the level of analgesia obtained during the initial 1 hour following the intervention, is believed to be the result of a combination of factors. These factors may include, but are not limited to:  1. The effectiveness of the local anesthetics used. 2. The effects of the analgesic(s) and/or anxiolytic(s) used. 3. The degree of discomfort experienced by the patient at the time of the procedure. 4. The patients ability and reliability in recalling and recording the events. 5. The presence and influence of possible secondary gains and/or psychosocial factors. Reported result: Relief experienced during the 1st hour after the procedure: 100 % (Ultra-Short Term Relief)            Interpretative annotation: Clinically appropriate result. Analgesia during this period is likely to be Local Anesthetic and/or IV Sedative (Analgesic/Anxiolytic) related.          Effects of local anesthetic: The analgesic effects attained during this period are directly associated to the localized infiltration of local anesthetics and therefore cary significant diagnostic value as to the etiological location, or anatomical origin, of the pain. Expected duration of relief is directly dependent on the  pharmacodynamics of the local anesthetic used. Long-acting (4-6 hours) anesthetics used.  Reported result: Relief during the next 4 to 6 hour after the procedure: 100 % (Short-Term Relief)            Interpretative annotation: Clinically appropriate result. Analgesia during this period is likely to be Local Anesthetic-related.          Long-term benefit: Defined as the period of time past the expected duration of local anesthetics (1 hour for short-acting and 4-6 hours for long-acting). With the possible exception of prolonged sympathetic blockade from the local anesthetics, benefits during this period are typically attributed to, or associated with, other factors such as analgesic sensory neuropraxia, antiinflammatory effects, or beneficial biochemical changes provided by agents other than the local anesthetics.  Reported result: Extended relief following procedure: 100 %(last for 1 week) (Long-Term Relief)            Interpretative annotation: Clinically appropriate result. Good relief. No permanent benefit expected. Inflammation plays a part in the etiology to the pain.         Physical Exam  General appearance: Well nourished, well developed, and well hydrated. In no apparent acute distress Mental status: Alert, oriented x 3 (person, place, & time)       Respiratory: No evidence of acute respiratory distress Eyes: PERLA Vitals: BP (!) 153/100   Pulse 84   Temp 98.4 F (36.9 C)   Ht 6' 4.8" (1.951 m)   Wt 210 lb (95.3 kg)   SpO2 100%   BMI 25.03 kg/m  BMI: Estimated body mass index is 25.03 kg/m as calculated from the following:   Height as of this encounter: 6' 4.8" (1.951 m).   Weight as of this encounter: 210 lb (95.3 kg). Ideal: Ideal body weight: 88.6 kg (195 lb 6.7 oz) Adjusted ideal body weight: 91.3 kg (201 lb 4 oz) Lumbar Spine Area Exam  Skin & Axial Inspection: Well healed scar from previous spine surgery detected Alignment: Symmetrical Functional ROM: Unrestricted ROM        Stability: No instability detected Muscle Tone/Strength: Functionally intact. No obvious neuro-muscular anomalies detected. Sensory (Neurological): Unimpaired Palpation: No palpable anomalies        Gait & Posture Assessment  Ambulation: Unassisted Gait: Relatively normal for age and body habitus Posture: WNL  Lower Extremity Exam    Side: Right lower extremity  Side: Left lower extremity  Stability: No instability observed          Stability: No instability observed          Skin &  Extremity Inspection: Edema  Skin & Extremity Inspection: Edema  Functional ROM: Unrestricted ROM                  Functional ROM: Unrestricted ROM                  Muscle Tone/Strength: Functionally intact. No obvious neuro-muscular anomalies detected.  Muscle Tone/Strength: Functionally intact. No obvious neuro-muscular anomalies detected.  Sensory (Neurological): Neuropathic pain pattern        Sensory (Neurological): Neuropathic pain pattern         Assessment   Status Diagnosis  Persistent Persistent Persistent 1. Spondylosis without myelopathy or radiculopathy, lumbar region   2. Chronic lower extremity pain (Bilateral) (R>L)   3. Osteoarthritis of hip (Right)   4. Musculoskeletal pain   5. Chronic pain syndrome   6. Long term prescription opiate use      Updated Problems: Problem  Pain Management Contract Broken (Resolved)   Patient no longer under opioid agreement contract.  He is still a patient with in the clinic and is eligible for interventional therapy.     Plan of Care  Medications: I am having [name] maintain his metoprolol tartrate, albuterol, traZODone, omeprazole, famotidine, gabapentin, cyclobenzaprine, promethazine, and traMADol.  Administered today: [name] had no medications administered during this visit.  Orders:  Orders Placed This Encounter  Procedures  . ToxASSURE Select 13 (MW), Urine    Volume: 30 ml(s). Minimum 3 ml of urine is  needed. Document temperature of fresh sample. Indications: Long term (current) use of opiate analgesic (D17.616)   Interventional options: Planned follow-up:   Return in about 4 weeks (around 05/18/2018) for MedMgmt.   Considering:  Lower extremity EMG/PNCV. (Radiculopathy versus peripheral neuropathy) Upper extremity EMG/PNCV.  Possibleright-sided L3 + L4 Lumbarsympathetic RFA Possible Racz procedure Diagnostic cervical epidural steroidinjection  Diagnostic bilateral cervical facet block  Possible bilateral cervical facet RFA Diagnostic bilateral lumbar facet block Possible bilateral lumbar facet RFA Diagnostic right intra-articular shoulder joint injection Diagnostic right suprascapular nerveblock Possibleright suprascapular RFA Diagnostic bilateral intra-articular knee joint injection Possible bilateral series of 5 Hyalgan knee injections Diagnostic bilateral Genicular nerve block Possible bilateral Genicular RFA Diagnostic bilateral greater occipital nerve block Possible bilateral greater occipital nerve RFA Diagnostic bilateral C2 +TON nerve block Possible bilateral C2 + TON RFA   Palliative PRN treatment(s):  Diagnostic Caudal ESI#3 Diagnostic right-sided L3 + L4 lumbar sympathetic block#3      Note by: Dionisio David, NP Date: 04/20/2018; Time: 9:06 AM

## 2018-04-20 NOTE — Patient Instructions (Signed)
____________________________________________________________________________________________  Medication Rules  Purpose: To inform patients, and their family members, of our rules and regulations.  Applies to: All patients receiving prescriptions (written or electronic).  Pharmacy of record: Pharmacy where electronic prescriptions will be sent. If written prescriptions are taken to a different pharmacy, please inform the nursing staff. The pharmacy listed in the electronic medical record should be the one where you would like electronic prescriptions to be sent.  Electronic prescriptions: In compliance with the [place] Strengthen Opioid Misuse Prevention (STOP) Act of 2017 (Session Law 2017-74/H243), effective March 11, 2018, all controlled substances must be electronically prescribed. Calling prescriptions to the pharmacy will cease to exist.  Prescription refills: Only during scheduled appointments. Applies to all prescriptions.  NOTE: The following applies primarily to controlled substances (Opioid* Pain Medications).   Patient's responsibilities: 1. Pain Pills: Bring all pain pills to every appointment (except for procedure appointments). 2. Pill Bottles: Bring pills in original pharmacy bottle. Always bring the newest bottle. Bring bottle, even if empty. 3. Medication refills: You are responsible for knowing and keeping track of what medications you take and those you need refilled. The day before your appointment: write a list of all prescriptions that need to be refilled. The day of the appointment: give the list to the admitting nurse. Prescriptions will be written only during appointments. No prescriptions will be written on procedure days. If you forget a medication: it will not be "Called in", "Faxed", or "electronically sent". You will need to get another appointment to get these prescribed. No early refills. Do not call asking to have your prescription filled  early. 4. Prescription Accuracy: You are responsible for carefully inspecting your prescriptions before leaving our office. Have the discharge nurse carefully go over each prescription with you, before taking them home. Make sure that your name is accurately spelled, that your address is correct. Check the name and dose of your medication to make sure it is accurate. Check the number of pills, and the written instructions to make sure they are clear and accurate. Make sure that you are given enough medication to last until your next medication refill appointment. 5. Taking Medication: Take medication as prescribed. When it comes to controlled substances, taking less pills or less frequently than prescribed is permitted and encouraged. Never take more pills than instructed. Never take medication more frequently than prescribed.  6. Inform other Doctors: Always inform, all of your healthcare providers, of all the medications you take. 7. Pain Medication from other Providers: You are not allowed to accept any additional pain medication from any other Doctor or Healthcare provider. There are two exceptions to this rule. (see below) In the event that you require additional pain medication, you are responsible for notifying us, as stated below. 8. Medication Agreement: You are responsible for carefully reading and following our Medication Agreement. This must be signed before receiving any prescriptions from our practice. Safely store a copy of your signed Agreement. Violations to the Agreement will result in no further prescriptions. (Additional copies of our Medication Agreement are available upon request.) 9. Laws, Rules, & Regulations: All patients are expected to follow all Federal and State Laws, Statutes, Rules, & Regulations. Ignorance of the Laws does not constitute a valid excuse. The use of any illegal substances is prohibited. 10. Adopted CDC guidelines & recommendations: Target dosing levels will be  at or below 60 MME/day. Use of benzodiazepines** is not recommended.  Exceptions: There are only two exceptions to the rule of not   receiving pain medications from other Healthcare Providers. 1. Exception #1 (Emergencies): In the event of an emergency (i.e.: accident requiring emergency care), you are allowed to receive additional pain medication. However, you are responsible for: As soon as you are able, call our office [phone], at any time of the day or night, and leave a message stating your name, the date and nature of the emergency, and the name and dose of the medication prescribed. In the event that your call is answered by a member of our staff, make sure to document and save the date, time, and the name of the person that took your information.  2. Exception #2 (Planned Surgery): In the event that you are scheduled by another doctor or dentist to have any type of surgery or procedure, you are allowed (for a period no longer than 30 days), to receive additional pain medication, for the acute post-op pain. However, in this case, you are responsible for picking up a copy of our "Post-op Pain Management for Surgeons" handout, and giving it to your surgeon or dentist. This document is available at our office, and does not require an appointment to obtain it. Simply go to our office during business hours (Monday-Thursday from 8:00 AM to 4:00 PM) (Friday 8:00 AM to 12:00 Noon) or if you have a scheduled appointment with us, prior to your surgery, and ask for it by name. In addition, you will need to provide us with your name, name of your surgeon, type of surgery, and date of procedure or surgery.  *Opioid medications include: morphine, codeine, oxycodone, oxymorphone, hydrocodone, hydromorphone, meperidine, tramadol, tapentadol, buprenorphine, fentanyl, methadone. **Benzodiazepine medications include: diazepam (Valium), alprazolam (Xanax), clonazepam (Klonopine), lorazepam (Ativan), clorazepate  (Tranxene), chlordiazepoxide (Librium), estazolam (Prosom), oxazepam (Serax), temazepam (Restoril), triazolam (Halcion) (Last updated: 05/08/2017) ____________________________________________________________________________________________    

## 2018-04-20 NOTE — Progress Notes (Signed)
Nursing Pain Medication Assessment:  Safety precautions to be maintained throughout the outpatient stay will include: orient to surroundings, keep bed in low position, maintain call bell within reach at all times, provide assistance with transfer out of bed and ambulation.  Medication Inspection Compliance: Pill count conducted under aseptic conditions, in front of the patient. Neither the pills nor the bottle was removed from the patient's sight at any time. Once count was completed pills were immediately returned to the patient in their original bottle.  Medication: Tramadol (Ultram) Pill/Patch Count: 8 of 240 pills remain Pill/Patch Appearance: Markings consistent with prescribed medication Bottle Appearance: Standard pharmacy container. Clearly labeled. Filled Date: 1 / 46 / 2020 Last Medication intake:  Today

## 2018-04-22 ENCOUNTER — Ambulatory Visit: Payer: Self-pay | Admitting: Pain Medicine

## 2018-04-22 MED FILL — traMADol HCL 50 MG TABS: 50 | 30 days supply | Qty: 240 | Fill #0

## 2018-04-25 LAB — TOXASSURE SELECT 13 (MW), URINE

## 2018-05-04 MED FILL — CYCLOBENZAPRINE 10 MG TAB: 10 | 30 days supply | Qty: 30 | Fill #1

## 2018-05-04 MED FILL — LOSARTAN POTASSIUM 100 MG T: 100 | 30 days supply | Qty: 30 | Fill #1

## 2018-05-05 NOTE — Progress Notes (Deleted)
Patient's Name: Mark [name]  MRN: [id number]  Referring Provider: Ladell Pier, MD  DOB: 11-14-1965  PCP: Ladell Pier, MD  DOS: 05/06/2018  Note by: [name], MD  Service setting: Ambulatory outpatient  Specialty: Interventional Pain Management  Location: [facility] (AMB) Pain Management Facility    Patient type: Established   Primary Reason(s) for Visit: Encounter for post-procedure evaluation of chronic illness with mild to moderate exacerbation CC: No chief complaint on file.  HPI  Mr. Azzara is a 53 y.o. year old, male patient, who comes today for a post-procedure evaluation. 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); Major depressive disorder, recurrent episode, severe ([place]); Chronic shoulder pain (Right); Chronic radicular low back pain [facility] source of pain) (Right) (to calf); Chronic fatigue; Vitamin D insufficiency; Poor dentition; Chronic maxillary sinusitis; Erectile dysfunction; Chronic anxiety; GERD (gastroesophageal reflux disease); GAD (generalized anxiety disorder); Alcohol use disorder, severe, in early remission ([place]); Long term current use of opiate analgesic; Long term prescription opiate use; Chronic neck pain (Bilateral)(R>L); Cervical fusion syndrome; Failed back surgical syndrome; Chronic knee pain  (Bilateral) (R>L); Major depressive disorder, recurrent episode, moderate ([place]); Musculoskeletal pain; 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[place]); Occipital headache (Bilateral) (R>L); Opiate use; Chronic lower extremity pain (Bilateral) (R>L); Lumbar facet syndrome (Bilateral) (R>L); Chronic sacroiliac  joint pain (Bilateral) (R>L); Sympathetic pain (lower extremity); Lower extremity neuropathy (Primary Source of Pain) (Bilateral) (R>L); Allodynia (Lower extremities); Hyperalgesia (Lower extremities); Opioid-induced hyperalgesia (with oxycodone); Dyspepsia; Alcohol withdrawal ([place]); Spondylosis without myelopathy or radiculopathy, lumbar region; Other specified dorsopathies, sacral and sacrococcygeal region; Chronic hip pain (Right); Chronic sacroiliac joint pain (Left); and Osteoarthritis of hip (Right) on their problem list. His primarily concern today is the No chief complaint on file.  Pain Assessment: Location:     Radiating:   Onset:   Duration:   Quality:   Severity:  /10 (subjective, self-reported pain score)  Note: Reported level is compatible with observation.                         When using our objective Pain Scale, levels between 6 and 10/10 are said to belong in an emergency room, as it progressively worsens from a 6/10, described as severely limiting, requiring emergency care not usually available at an outpatient pain management facility. At a 6/10 level, communication becomes difficult and requires great effort. Assistance to reach the emergency department may be required. Facial flushing and profuse sweating along with potentially dangerous increases in heart rate and blood pressure will be evident. Effect on ADL:   Timing:   Modifying factors:   BP:    HR:    Mr. Urton comes in today for post-procedure evaluation. I plan to bring him back for a diagnostic left-sided intra-articular SI joint injection.  If this provides him with good relief, then we will move onto RFA of the left SI.  Following that, we plan to do radiofrequency of the lumbar facets, bilaterally.  Further details on both, my assessment(s), as well as the proposed treatment plan, please see below.  Post-Procedure Assessment  04/07/2018 Procedure: Diagnostic right-sided intra-articular hip joint injection #1  under fluoroscopic guidance and IV sedation Pre-procedure pain score:  6/10 Post-procedure pain score: 0/10 (100%  relief) Influential Factors: BMI:   Intra-procedural challenges: None observed.         Assessment challenges: None detected.              Reported side-effects: None.        Post-procedural adverse reactions or complications: None reported         Sedation: Please see nurses note. When no sedatives are used, the analgesic levels obtained are directly associated to the effectiveness of the local anesthetics. However, when sedation is provided, the level of analgesia obtained during the initial 1 hour following the intervention, is believed to be the result of a combination of factors. These factors may include, but are not limited to: 1. The effectiveness of the local anesthetics used. 2. The effects of the analgesic(s) and/or anxiolytic(s) used. 3. The degree of discomfort experienced by the patient at the time of the procedure. 4. The patients ability and reliability in recalling and recording the events. 5. The presence and influence of possible secondary gains and/or psychosocial factors. Reported result: Relief experienced during the 1st hour after the procedure:   (Ultra-Short Term Relief)            Interpretative annotation: Clinically appropriate result. Analgesia during this period is likely to be Local Anesthetic and/or IV Sedative (Analgesic/Anxiolytic) related.          Effects of local anesthetic: The analgesic effects attained during this period are directly associated to the localized infiltration of local anesthetics and therefore cary significant diagnostic value as to the etiological location, or anatomical origin, of the pain. Expected duration of relief is directly dependent on the pharmacodynamics of the local anesthetic used. Long-acting (4-6 hours) anesthetics used.  Reported result: Relief during the next 4 to 6 hour after the procedure:   (Short-Term Relief)             Interpretative annotation: Clinically appropriate result. Analgesia during this period is likely to be Local Anesthetic-related.          Long-term benefit: Defined as the period of time past the expected duration of local anesthetics (1 hour for short-acting and 4-6 hours for long-acting). With the possible exception of prolonged sympathetic blockade from the local anesthetics, benefits during this period are typically attributed to, or associated with, other factors such as analgesic sensory neuropraxia, antiinflammatory effects, or beneficial biochemical changes provided by agents other than the local anesthetics.  Reported result: Extended relief following procedure:   (Long-Term Relief)            Interpretative annotation: Clinically possible results. Good relief. No permanent benefit expected. Inflammation plays a part in the etiology to the pain.          Current benefits: Defined as reported results that persistent at this point in time.   Analgesia: *** %            Function: Somewhat improved ROM: Somewhat improved Interpretative annotation: Recurrence of symptoms. No permanent benefit expected. Effective diagnostic intervention.          Interpretation: Results would suggest a successful diagnostic intervention.                  Plan:  Please see "Plan of Care" for details.                Laboratory Chemistry  Inflammation Markers (CRP: Acute Phase) (ESR: Chronic Phase) Lab Results  Component Value Date   CRP 0.9 03/18/2016   ESRSEDRATE 2 03/18/2016  LATICACIDVEN 1.16 02/06/2015                         Renal Markers Lab Results  Component Value Date   BUN 6 01/03/2018   CREATININE 0.65 01/03/2018   BCR 12 04/09/2017   GFRAA >60 01/03/2018   GFRNONAA >60 01/03/2018                             Hepatic Markers Lab Results  Component Value Date   AST 43 (H) 01/03/2018   ALT 36 01/03/2018   ALBUMIN 3.8 01/03/2018   HCVAB NEGATIVE 06/14/2012                         Note: Lab results reviewed.  Recent Imaging Results   Results for orders placed in visit on 04/07/18  DG C-Arm 1-60 Min-No Report   Narrative Fluoroscopy was utilized by the requesting physician.  No radiographic  interpretation.    Interpretation Report: Fluoroscopy was used during the procedure to assist with needle guidance. The images were interpreted intraoperatively by the requesting physician.  Meds   Current Outpatient Medications:  .  albuterol (VENTOLIN HFA) 108 (90 Base) MCG/ACT inhaler, Inhale 2 puffs into the lungs every 6 (six) hours as needed for wheezing or shortness of breath., Disp: 18 g, Rfl: 3 .  cyclobenzaprine (FLEXERIL) 10 MG tablet, Take 1 tablet (10 mg total) by mouth at bedtime., Disp: 30 tablet, Rfl: 2 .  famotidine (PEPCID AC) 10 MG chewable tablet, Chew 10 mg by mouth as needed for heartburn., Disp: , Rfl:  .  gabapentin (NEURONTIN) 300 MG capsule, Take 1 capsule (300 mg total) by mouth every 8 (eight) hours., Disp: 90 capsule, Rfl: 2 .  metoprolol (LOPRESSOR) 50 MG tablet, Take 1 tablet (50 mg total) by mouth 2 (two) times daily. (Patient taking differently: Take 50 mg by mouth 3 (three) times daily. ), Disp: 60 tablet, Rfl: 11 .  omeprazole (PRILOSEC OTC) 20 MG tablet, Take 20 mg by mouth 2 (two) times daily., Disp: , Rfl:  .  promethazine (PHENERGAN) 12.5 MG tablet, Take 1 tablet (12.5 mg total) by mouth daily as needed for nausea or vomiting. Each prescription to last a mth, Disp: 10 tablet, Rfl: 1 .  traMADol (ULTRAM) 50 MG tablet, Take 1-2 tablets (50-100 mg total) by mouth 4 (four) times daily for 30 days. Must last 30 days., Disp: 240 tablet, Rfl: 0 .  traZODone (DESYREL) 100 MG tablet, Take 1 tablet (100 mg total) by mouth at bedtime as needed. (Patient taking differently: Take 50-100 mg by mouth at bedtime as needed for sleep. ), Disp: 30 tablet, Rfl: 3  ROS  Constitutional: Denies any fever or chills Gastrointestinal: No reported hemesis,  hematochezia, vomiting, or acute GI distress Musculoskeletal: Denies any acute onset joint swelling, redness, loss of ROM, or weakness Neurological: No reported episodes of acute onset apraxia, aphasia, dysarthria, agnosia, amnesia, paralysis, loss of coordination, or loss of consciousness  Allergies  Mr. Schwandt is allergic to cozaar [losartan] and lisinopril.  [place]  Drug: Mr. Geil  reports no history of drug use. Alcohol:  reports current alcohol use. Tobacco:  reports that he has been smoking cigarettes. He has a 15.00 pack-year smoking history. He has never used smokeless tobacco. Medical:  has a past medical history of Alcohol withdrawal ([place]), Allergy, Anxiety (Dx 2014), COPD (chronic obstructive pulmonary  disease) ([place]), Drug abuse ([place]), Enlarged prostate, ETOH abuse, GERD (gastroesophageal reflux disease) (Dx 2003), Headache(784.0), Hepatitis, Hyperlipidemia (Dx 2000), Hypertension (Dx 2011), IBS (irritable bowel syndrome), Irregular heart beat, Long Q-T syndrome (01/23/2014), Mental disorder, Neuromuscular disorder ([place]), Neuropathy (06/07/2013), Seizure due to alcohol withdrawal ([place]) (01/23/2014), Shortness of breath, and Withdrawal seizures ([place]) (2014). Surgical: Mr. Carfagno  has a past surgical history that includes Back surgery (1995, 1999); Neck surgery (2000); Nasal sinus surgery; left heart catheterization with coronary angiogram (N/A, 01/13/2014); Subacromial decompression (Right, 11/16/2014); Nissen fundoplication; 24 hour ph study (N/A, 01/16/2015); Upper gastrointestinal endoscopy; Colonoscopy (1998); and Shoulder surgery (Right). Family: family history includes Alcohol abuse in his father and paternal grandfather; Breast cancer in his paternal grandmother; Colon polyps in his mother; Hypertension in his mother; Lung cancer in his father.  Constitutional Exam  General appearance: Well nourished, well developed, and well hydrated. In no apparent acute distress There were no  vitals filed for this visit. BMI Assessment: Estimated body mass index is 25.03 kg/m as calculated from the following:   Height as of 04/20/18: 6' 4.8" (1.951 m).   Weight as of 04/20/18: 210 lb (95.3 kg).  BMI interpretation table: BMI level Category Range association with higher incidence of chronic pain  <18 kg/m2 Underweight   18.5-24.9 kg/m2 Ideal body weight   25-29.9 kg/m2 Overweight Increased incidence by 20%  30-34.9 kg/m2 Obese (Class I) Increased incidence by 68%  35-39.9 kg/m2 Severe obesity (Class II) Increased incidence by 136%  >40 kg/m2 Extreme obesity (Class III) Increased incidence by 254%   Patient's current BMI Ideal Body weight  There is no height or weight on file to calculate BMI. Patient weight not recorded   BMI Readings from Last 4 Encounters:  04/20/18 25.03 kg/m  04/07/18 26.78 kg/m  03/23/18 26.78 kg/m  03/12/18 26.78 kg/m   Wt Readings from Last 4 Encounters:  04/20/18 210 lb (95.3 kg)  04/07/18 220 lb (99.8 kg)  03/23/18 220 lb (99.8 kg)  03/12/18 220 lb (99.8 kg)  Psych/Mental status: Alert, oriented x 3 (person, place, & time)       Eyes: PERLA Respiratory: No evidence of acute respiratory distress  Cervical Spine Area Exam  Skin & Axial Inspection: No masses, redness, edema, swelling, or associated skin lesions Alignment: Symmetrical Functional ROM: Unrestricted ROM      Stability: No instability detected Muscle Tone/Strength: Functionally intact. No obvious neuro-muscular anomalies detected. Sensory (Neurological): Unimpaired Palpation: No palpable anomalies              Upper Extremity (UE) Exam    Side: Right upper extremity  Side: Left upper extremity  Skin & Extremity Inspection: Skin color, temperature, and hair growth are WNL. No peripheral edema or cyanosis. No masses, redness, swelling, asymmetry, or associated skin lesions. No contractures.  Skin & Extremity Inspection: Skin color, temperature, and hair growth are WNL. No  peripheral edema or cyanosis. No masses, redness, swelling, asymmetry, or associated skin lesions. No contractures.  Functional ROM: Unrestricted ROM          Functional ROM: Unrestricted ROM          Muscle Tone/Strength: Functionally intact. No obvious neuro-muscular anomalies detected.  Muscle Tone/Strength: Functionally intact. No obvious neuro-muscular anomalies detected.  Sensory (Neurological): Unimpaired          Sensory (Neurological): Unimpaired          Palpation: No palpable anomalies              Palpation: No  palpable anomalies              Provocative Test(s):  Phalen's test: deferred Tinel's test: deferred Apley's scratch test (touch opposite shoulder):  Action 1 (Across chest): deferred Action 2 (Overhead): deferred Action 3 (LB reach): deferred   Provocative Test(s):  Phalen's test: deferred Tinel's test: deferred Apley's scratch test (touch opposite shoulder):  Action 1 (Across chest): deferred Action 2 (Overhead): deferred Action 3 (LB reach): deferred    Thoracic Spine Area Exam  Skin & Axial Inspection: No masses, redness, or swelling Alignment: Symmetrical Functional ROM: Unrestricted ROM Stability: No instability detected Muscle Tone/Strength: Functionally intact. No obvious neuro-muscular anomalies detected. Sensory (Neurological): Unimpaired Muscle strength & Tone: No palpable anomalies  Lumbar Spine Area Exam  Skin & Axial Inspection: No masses, redness, or swelling Alignment: Symmetrical Functional ROM: Unrestricted ROM       Stability: No instability detected Muscle Tone/Strength: Functionally intact. No obvious neuro-muscular anomalies detected. Sensory (Neurological): Unimpaired Palpation: No palpable anomalies       Provocative Tests: Hyperextension/rotation test: deferred today       Lumbar quadrant test (Kemp's test): deferred today       Lateral bending test: deferred today       Patrick's Maneuver: deferred today                   FABER*  test: deferred today                   S-I anterior distraction/compression test: deferred today         S-I lateral compression test: deferred today         S-I Thigh-thrust test: deferred today         S-I Gaenslen's test: deferred today         *(Flexion, ABduction and External Rotation)  Gait & Posture Assessment  Ambulation: Unassisted Gait: Relatively normal for age and body habitus Posture: WNL   Lower Extremity Exam    Side: Right lower extremity  Side: Left lower extremity  Stability: No instability observed          Stability: No instability observed          Skin & Extremity Inspection: Skin color, temperature, and hair growth are WNL. No peripheral edema or cyanosis. No masses, redness, swelling, asymmetry, or associated skin lesions. No contractures.  Skin & Extremity Inspection: Skin color, temperature, and hair growth are WNL. No peripheral edema or cyanosis. No masses, redness, swelling, asymmetry, or associated skin lesions. No contractures.  Functional ROM: Unrestricted ROM                  Functional ROM: Unrestricted ROM                  Muscle Tone/Strength: Functionally intact. No obvious neuro-muscular anomalies detected.  Muscle Tone/Strength: Functionally intact. No obvious neuro-muscular anomalies detected.  Sensory (Neurological): Unimpaired        Sensory (Neurological): Unimpaired        DTR: Patellar: deferred today Achilles: deferred today Plantar: deferred today  DTR: Patellar: deferred today Achilles: deferred today Plantar: deferred today  Palpation: No palpable anomalies  Palpation: No palpable anomalies   Assessment   Status Diagnosis  Controlled Controlled Controlled 1. 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 Chronic low back pain (Secondary source of pain) (Bilateral) (R>L)   3. Chronic radicular low back pain [facility] source of pain) (Right) (to calf)  4. Chronic sacroiliac joint pain (Left)   5. Failed  back surgical syndrome      Updated Problems: No problems updated.  Plan of Care  Pharmacotherapy (Medications Ordered): No orders of the defined types were placed in this encounter.  Medications administered today: [name] had no medications administered during this visit.  Procedure Orders    No procedure(s) ordered today   Lab Orders  No laboratory test(s) ordered today   Imaging Orders  No imaging studies ordered today   Referral Orders  No referral(s) requested today   Interventional management options: Planned, scheduled, and/or pending:   Diagnostic left sacroiliac joint injection under fluoroscopic guidance and IV sedation.    Considering:   Lower extremity EMG/PNCV. (Radiculopathy versus peripheral neuropathy) Upper extremity EMG/PNCV. Possibleright-sided L3 + L4 Lumbarsympathetic RFA Possible Racz procedure Diagnostic cervical epidural steroidinjection  Diagnostic bilateral cervical facet block  Possible bilateral cervical facet RFA Diagnostic bilateral lumbar facet block Possible bilateral lumbar facet RFA Diagnostic right intra-articular shoulder joint injection Diagnostic right suprascapular nerveblock Possibleright suprascapular RFA Diagnostic bilateral intra-articular knee joint injection Possible bilateral series of 5 Hyalgan knee injections Diagnostic bilateral Genicular nerve block Possible bilateral Genicular RFA Diagnostic bilateral greater occipital nerve block Possible bilateral greater occipital nerve RFA Diagnostic bilateral C2+TON nerve block Possible bilateral C2 + TON RFA   Palliative PRN treatment(s):   None at this time   Provider-requested follow-up: No follow-ups on file.  Future Appointments  Date Time Provider [place]  05/06/2018 10:15 AM Milinda Pointer, MD [facility] None  05/18/2018 11:15 AM Vevelyn Francois, NP [facility] None   Primary Care Physician: Ladell Pier, MD Location:  [facility] Note by: [name], MD Date: 05/06/2018; Time: 4:20 PM

## 2018-05-06 ENCOUNTER — Ambulatory Visit: Payer: Self-pay | Admitting: Pain Medicine

## 2018-05-18 ENCOUNTER — Other Ambulatory Visit: Payer: Self-pay

## 2018-05-18 ENCOUNTER — Ambulatory Visit: Payer: Self-pay | Attending: Nurse Practitioner | Admitting: Nurse Practitioner

## 2018-05-18 ENCOUNTER — Encounter: Payer: Self-pay | Admitting: Nurse Practitioner

## 2018-05-18 VITALS — BP 150/91 | HR 67 | Temp 98.0°F | Ht 76.0 in | Wt 215.0 lb

## 2018-05-18 DIAGNOSIS — G8929 Other chronic pain: Secondary | ICD-10-CM | POA: Insufficient documentation

## 2018-05-18 DIAGNOSIS — R110 Nausea: Secondary | ICD-10-CM | POA: Insufficient documentation

## 2018-05-18 DIAGNOSIS — M5416 Radiculopathy, lumbar region: Secondary | ICD-10-CM | POA: Insufficient documentation

## 2018-05-18 DIAGNOSIS — M7918 Myalgia, other site: Secondary | ICD-10-CM | POA: Insufficient documentation

## 2018-05-18 DIAGNOSIS — M533 Sacrococcygeal disorders, not elsewhere classified: Secondary | ICD-10-CM | POA: Insufficient documentation

## 2018-05-18 DIAGNOSIS — G894 Chronic pain syndrome: Secondary | ICD-10-CM | POA: Insufficient documentation

## 2018-05-18 MED ORDER — GABAPENTIN 300 MG PO CAPS: 300.0000 mg | ORAL_CAPSULE | Freq: Two times a day (BID) | ORAL | 0 refills | Status: DC

## 2018-05-18 MED ORDER — TRAMADOL HCL 50 MG PO TABS: 50.0000 mg | ORAL_TABLET | Freq: Four times a day (QID) | ORAL | 0 refills | Status: DC

## 2018-05-18 MED ORDER — CYCLOBENZAPRINE HCL 10 MG PO TABS: 10.0000 mg | ORAL_TABLET | Freq: Every day | ORAL | 0 refills | Status: DC

## 2018-05-18 MED ORDER — PROMETHAZINE HCL 12.5 MG PO TABS: 12.5000 mg | ORAL_TABLET | Freq: Every day | ORAL | 0 refills | Status: DC | PRN

## 2018-05-18 MED FILL — CYCLOBENZAPRINE 10 MG TAB: 10 | 30 days supply | Qty: 30 | Fill #0

## 2018-05-18 MED FILL — PROMETHAZINE 12.5 MG TABLET: 12.5 | 30 days supply | Qty: 10 | Fill #0

## 2018-05-18 MED FILL — GABAPENTIN 300 MG CAPSULE: 300 | 30 days supply | Qty: 90 | Fill #2

## 2018-05-18 MED FILL — METOPROLOL SUCCINATE ER 100: 100 | 30 days supply | Qty: 30 | Fill #1

## 2018-05-18 MED FILL — traMADol HCL 50 MG TABS: 50 | 30 days supply | Qty: 240 | Fill #0

## 2018-05-18 NOTE — Patient Instructions (Signed)
____________________________________________________________________________________________  Medication Rules  Purpose: To inform patients, and their family members, of our rules and regulations.  Applies to: All patients receiving prescriptions (written or electronic).  Pharmacy of record: Pharmacy where electronic prescriptions will be sent. If written prescriptions are taken to a different pharmacy, please inform the nursing staff. The pharmacy listed in the electronic medical record should be the one where you would like electronic prescriptions to be sent.  Electronic prescriptions: In compliance with the [place] Strengthen Opioid Misuse Prevention (STOP) Act of 2017 (Session Law 2017-74/H243), effective March 11, 2018, all controlled substances must be electronically prescribed. Calling prescriptions to the pharmacy will cease to exist.  Prescription refills: Only during scheduled appointments. Applies to all prescriptions.  NOTE: The following applies primarily to controlled substances (Opioid* Pain Medications).   Patient's responsibilities: 1. Pain Pills: Bring all pain pills to every appointment (except for procedure appointments). 2. Pill Bottles: Bring pills in original pharmacy bottle. Always bring the newest bottle. Bring bottle, even if empty. 3. Medication refills: You are responsible for knowing and keeping track of what medications you take and those you need refilled. The day before your appointment: write a list of all prescriptions that need to be refilled. The day of the appointment: give the list to the admitting nurse. Prescriptions will be written only during appointments. No prescriptions will be written on procedure days. If you forget a medication: it will not be "Called in", "Faxed", or "electronically sent". You will need to get another appointment to get these prescribed. No early refills. Do not call asking to have your prescription filled  early. 4. Prescription Accuracy: You are responsible for carefully inspecting your prescriptions before leaving our office. Have the discharge nurse carefully go over each prescription with you, before taking them home. Make sure that your name is accurately spelled, that your address is correct. Check the name and dose of your medication to make sure it is accurate. Check the number of pills, and the written instructions to make sure they are clear and accurate. Make sure that you are given enough medication to last until your next medication refill appointment. 5. Taking Medication: Take medication as prescribed. When it comes to controlled substances, taking less pills or less frequently than prescribed is permitted and encouraged. Never take more pills than instructed. Never take medication more frequently than prescribed.  6. Inform other Doctors: Always inform, all of your healthcare providers, of all the medications you take. 7. Pain Medication from other Providers: You are not allowed to accept any additional pain medication from any other Doctor or Healthcare provider. There are two exceptions to this rule. (see below) In the event that you require additional pain medication, you are responsible for notifying us, as stated below. 8. Medication Agreement: You are responsible for carefully reading and following our Medication Agreement. This must be signed before receiving any prescriptions from our practice. Safely store a copy of your signed Agreement. Violations to the Agreement will result in no further prescriptions. (Additional copies of our Medication Agreement are available upon request.) 9. Laws, Rules, & Regulations: All patients are expected to follow all Federal and State Laws, Statutes, Rules, & Regulations. Ignorance of the Laws does not constitute a valid excuse. The use of any illegal substances is prohibited. 10. Adopted CDC guidelines & recommendations: Target dosing levels will be  at or below 60 MME/day. Use of benzodiazepines** is not recommended.  Exceptions: There are only two exceptions to the rule of not   receiving pain medications from other Healthcare Providers. 1. Exception #1 (Emergencies): In the event of an emergency (i.e.: accident requiring emergency care), you are allowed to receive additional pain medication. However, you are responsible for: As soon as you are able, call our office [phone], at any time of the day or night, and leave a message stating your name, the date and nature of the emergency, and the name and dose of the medication prescribed. In the event that your call is answered by a member of our staff, make sure to document and save the date, time, and the name of the person that took your information.  2. Exception #2 (Planned Surgery): In the event that you are scheduled by another doctor or dentist to have any type of surgery or procedure, you are allowed (for a period no longer than 30 days), to receive additional pain medication, for the acute post-op pain. However, in this case, you are responsible for picking up a copy of our "Post-op Pain Management for Surgeons" handout, and giving it to your surgeon or dentist. This document is available at our office, and does not require an appointment to obtain it. Simply go to our office during business hours (Monday-Thursday from 8:00 AM to 4:00 PM) (Friday 8:00 AM to 12:00 Noon) or if you have a scheduled appointment with us, prior to your surgery, and ask for it by name. In addition, you will need to provide us with your name, name of your surgeon, type of surgery, and date of procedure or surgery.  *Opioid medications include: morphine, codeine, oxycodone, oxymorphone, hydrocodone, hydromorphone, meperidine, tramadol, tapentadol, buprenorphine, fentanyl, methadone. **Benzodiazepine medications include: diazepam (Valium), alprazolam (Xanax), clonazepam (Klonopine), lorazepam (Ativan), clorazepate  (Tranxene), chlordiazepoxide (Librium), estazolam (Prosom), oxazepam (Serax), temazepam (Restoril), triazolam (Halcion) (Last updated: 05/08/2017) ____________________________________________________________________________________________    

## 2018-05-18 NOTE — Progress Notes (Signed)
Nursing Pain Medication Assessment:  Safety precautions to be maintained throughout the outpatient stay will include: orient to surroundings, keep bed in low position, maintain call bell within reach at all times, provide assistance with transfer out of bed and ambulation.  Medication Inspection Compliance: Pill count conducted under aseptic conditions, in front of the patient. Neither the pills nor the bottle was removed from the patient's sight at any time. Once count was completed pills were immediately returned to the patient in their original bottle.  Medication: Tramadol (Ultram) Pill/Patch Count: 21 of 240 pills remain Pill/Patch Appearance: Markings consistent with prescribed medication Bottle Appearance: Standard pharmacy container. Clearly labeled. Filled Date: 2 / 12 / 2020 Last Medication intake:  Today

## 2018-05-18 NOTE — Progress Notes (Signed)
Patient's Name: Mark [name]  MRN: [id number]  Referring Provider: Ladell Pier, MD  DOB: 03/17/1965  PCP: Ladell Pier, MD  DOS: 05/18/2018  Note by: Dionisio David, NP  Service setting: Ambulatory outpatient  Specialty: Interventional Pain Management  Location: [facility] (AMB) Pain Management Facility    Patient type: Established   HPI  Reason for Visit: [name] is a 53 y.o. year old, male patient, who comes today with a chief complaint of Foot Pain Last Appointment: His last appointment at our practice was on 05/06/2018. I last saw him on 04/20/2018.  Pain Assessment: Today, [name] describes the severity of the Chronic pain as a 7 /10. He indicates the location/referral of the pain to be Foot(legs)  /pain radiaties down leg to feet. Onset was: More than a month ago. The quality of pain is described as Aching. Temporal description, or timing of pain is: Constant. Possible modifying factors: medications. [name]  height is '6\' 4"'$  (1.93 m) and weight is 215 lb (97.5 kg). His temperature is 98 F (36.7 C). His blood pressure is 150/91 (abnormal) and his pulse is 67. His oxygen saturation is 100%.   Controlled Substance Pharmacotherapy Assessment REMS (Risk Evaluation and Mitigation Strategy)  Tramadol 100 mg 4 times a day (400 mg/dayof tramadol)  MME/day:'40mg'$ /day.  Chauncey Fischer, RN  05/18/2018 10:13 AM  Sign when Signing Visit Nursing Pain Medication Assessment:  Safety precautions to be maintained throughout the outpatient stay will include: orient to surroundings, keep bed in low position, maintain call bell within reach at all times, provide assistance with transfer out of bed and ambulation.  Medication Inspection Compliance: Pill count conducted under aseptic conditions, in front of the patient. Neither the pills nor the bottle was removed from the patient's sight at any time. Once count was completed pills were immediately returned to the patient in their  original bottle.  Medication: Tramadol (Ultram) Pill/Patch Count: 21 of 240 pills remain Pill/Patch Appearance: Markings consistent with prescribed medication Bottle Appearance: Standard pharmacy container. Clearly labeled. Filled Date: 2 / 12 / 2020 Last Medication intake:  Today   Pharmacokinetics: Liberation and absorption (onset of action): WNL Distribution (time to peak effect): WNL Metabolism and excretion (duration of action): WNL         Pharmacodynamics: Desired effects: Analgesia: [name] reports >50% benefit. Functional ability: Patient reports that medication allows him to accomplish basic ADLs Clinically meaningful improvement in function (CMIF): Sustained CMIF goals met Perceived effectiveness: Described as relatively effective, allowing for increase in activities of daily living (ADL) Undesirable effects: Side-effects or Adverse reactions: None reported Monitoring: [place] PMP: Online review of the past 41-monthperiod conducted. Compliant with practice rules and regulations Last UDS on record: Summary  Date Value Ref Range Status  04/20/2018 FINAL  Final    Comment:    ==================================================================== TOXASSURE SELECT 13 (MW) ==================================================================== Test                             Result       Flag       Units Drug Present and Declared for Prescription Verification   Tramadol                       >4274        EXPECTED   ng/mg creat   O-Desmethyltramadol            >4274  EXPECTED   ng/mg creat   N-Desmethyltramadol            1461         EXPECTED   ng/mg creat    Source of tramadol is a prescription medication.    O-desmethyltramadol and N-desmethyltramadol are expected    metabolites of tramadol. ==================================================================== Test                      Result    Flag   Units      Ref Range   Creatinine              117               mg/dL      >=20 ==================================================================== Declared Medications:  The flagging and interpretation on this report are based on the  following declared medications.  Unexpected results may arise from  inaccuracies in the declared medications.  **Note: The testing scope of this panel includes these medications:  Tramadol  **Note: The testing scope of this panel does not include following  reported medications:  Albuterol  Cyclobenzaprine  Famotidine  Gabapentin  Metoprolol  Omeprazole  Promethazine  Trazodone ==================================================================== For clinical consultation, please call [phone]. ====================================================================    UDS interpretation: Compliant          Medication Assessment Form: Reviewed. Patient indicates being compliant with therapy Treatment compliance: Compliant Risk Assessment Profile: Aberrant behavior: See initial evaluations. None observed or detected today Comorbid factors increasing risk of overdose: See initial evaluation. No additional risks detected today Opioid risk tool (ORT):  Opioid Risk  05/18/2018  Alcohol 3  Illegal Drugs 0  Rx Drugs 0  Alcohol 3  Illegal Drugs 0  Rx Drugs 0  Age between 16-45 years  0  History of Preadolescent Sexual Abuse 0  Psychological Disease 2  ADD Negative  OCD Negative  Bipolar Negative  Depression 1  Opioid Risk Tool Scoring 9  Opioid Risk Interpretation High Risk    ORT Scoring interpretation table:  Score <3 = Low Risk for SUD  Score between 4-7 = Moderate Risk for SUD  Score >8 = High Risk for Opioid Abuse   Risk of substance use disorder (SUD): Low  Risk Mitigation Strategies:  Patient Counseling: Covered Patient-Prescriber Agreement (PPA): Present and active  Notification to other healthcare providers: Done  Pharmacologic Plan: No change in therapy, at this time.            ROS   Constitutional: Denies any fever or chills Gastrointestinal: No reported hemesis, hematochezia, vomiting, or acute GI distress Musculoskeletal: Denies any acute onset joint swelling, redness, loss of ROM, or weakness Neurological: No reported episodes of acute onset apraxia, aphasia, dysarthria, agnosia, amnesia, paralysis, loss of coordination, or loss of consciousness  Medication Review  albuterol, cyclobenzaprine, famotidine, gabapentin, metoprolol tartrate, omeprazole, promethazine, traMADol, and traZODone  History Review  Allergy: Mr. Upchurch is allergic to cozaar [losartan] and lisinopril. Drug: Mr. Asche  reports no history of drug use. Alcohol:  reports current alcohol use. Tobacco:  reports that he has been smoking cigarettes. He has a 15.00 pack-year smoking history. He has never used smokeless tobacco. Social: Mr. Knaggs  reports that he has been smoking cigarettes. He has a 15.00 pack-year smoking history. He has never used smokeless tobacco. He reports current alcohol use. He reports that he does not use drugs. 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 Surgical: Mr. Kreuser  has a past surgical history that includes Back surgery (1995, 1999); Neck surgery (2000); Nasal sinus surgery; left heart catheterization with coronary angiogram (N/A, 01/13/2014); Subacromial decompression (Right, 11/16/2014); Nissen fundoplication; 24 hour ph study  (N/A, 01/16/2015); Upper gastrointestinal endoscopy; Colonoscopy (1998); and Shoulder surgery (Right). Family: family history includes Alcohol abuse in his father and paternal grandfather; Breast cancer in his paternal grandmother; Colon polyps in his mother; Hypertension in his mother; Lung cancer in his father. Problem List: Mr. Cammarata has Chronic low back pain (Secondary source of pain) (Bilateral) (R>L); Chronic pain syndrome; Chronic shoulder pain (Right); Chronic radicular low back pain [facility] source of pain) (Right) (to calf); Chronic neck pain (Bilateral)(R>L); Cervical fusion syndrome; Failed back surgical syndrome; Chronic knee pain  (Bilateral) (R>L); Musculoskeletal pain; Chronic lower extremity pain (Bilateral) (R>L); Lumbar facet syndrome (Bilateral) (R>L); and Chronic sacroiliac joint pain (Bilateral) (R>L) on their pertinent problem list.  Lab Review  Kidney Function Lab Results  Component Value Date   BUN 6 01/03/2018   CREATININE 0.65 01/03/2018   BCR 12 04/09/2017   GFRAA >60 01/03/2018   GFRNONAA >60 01/03/2018  Liver Function Lab Results  Component Value Date   AST 43 (H) 01/03/2018   ALT 36 01/03/2018   ALBUMIN 3.8 01/03/2018  Note: Above Lab results reviewed.  Imaging Review  DG C-Arm 1-60 Min-No Report Fluoroscopy was utilized by the requesting physician.  No radiographic  interpretation.  Note: Reviewed        Physical Exam  General appearance: Well nourished, well developed, and well hydrated. In no apparent acute distress Mental status: Alert, oriented x 3 (person, place, & time)       Respiratory: No evidence of acute respiratory distress Eyes: PERLA Vitals: BP (!) 150/91   Pulse 67   Temp 98 F (36.7 C)   Ht '6\' 4"'$  (1.93 m)   Wt 215 lb (97.5 kg)   SpO2 100%   BMI 26.17 kg/m  BMI: Estimated body mass index is 26.17 kg/m as calculated from the following:   Height as of this encounter: '6\' 4"'$  (1.93 m).   Weight as of this encounter: 215 lb (97.5  kg). Ideal: Ideal body weight: 86.8 kg (191 lb 5.7 oz) Adjusted ideal body weight: 91.1 kg (200 lb 13 oz)  Lumbar Spine Area Exam  Skin & Axial Inspection: Well healed scar from previous spine surgery detected Alignment: Symmetrical Functional ROM: Unrestricted ROM       Stability: No instability detected Muscle Tone/Strength: Functionally intact. No obvious neuro-muscular anomalies detected. Sensory (Neurological): Unimpaired Palpation: No palpable anomalies       Provocative Tests: Hyperextension/rotation test: deferred today       Lumbar quadrant test (Kemp's test): deferred today       Lateral bending test: deferred today       Patrick's Maneuver: deferred today                    *(Flexion, ABduction and External Rotation) Gait & Posture Assessment  Ambulation: Unassisted Gait: Relatively normal for age and body habitus Posture: WNL  Lower Extremity Exam    Side:  Right lower extremity  Side: Left lower extremity  Stability: No instability observed          Stability: No instability observed          Skin & Extremity Inspection: Skin color, temperature, and hair growth are WNL. No peripheral edema or cyanosis. No masses, redness, swelling, asymmetry, or associated skin lesions. No contractures.  Skin & Extremity Inspection: Skin color, temperature, and hair growth are WNL. No peripheral edema or cyanosis. No masses, redness, swelling, asymmetry, or associated skin lesions. No contractures.  Functional ROM: Unrestricted ROM                  Functional ROM: Unrestricted ROM                  Muscle Tone/Strength: Functionally intact. No obvious neuro-muscular anomalies detected.  Muscle Tone/Strength: Functionally intact. No obvious neuro-muscular anomalies detected.  Sensory (Neurological): Neurogenic pain pattern        Sensory (Neurological): Neuropathic pain pattern            Palpation: No palpable anomalies  Palpation: No palpable anomalies   Assessment   Status Diagnosis   Controlled Controlled Controlled 1. Chronic radicular low back pain [facility] source of pain) (Right) (to calf)   2. Chronic sacroiliac joint pain (Bilateral) (R>L)   3. Chronic pain syndrome   4. Musculoskeletal pain   5. Nausea in adult      Updated Problems: No problems updated.  Plan of Care  Pharmacotherapy (Medications Ordered): Meds ordered this encounter  Medications  . traMADol (ULTRAM) 50 MG tablet    Sig: Take 1-2 tablets (50-100 mg total) by mouth 4 (four) times daily for 30 days. Must last 30 days.    Dispense:  240 tablet    Refill:  0    Do not place this medication, or any other prescription from our practice, on "Automatic Refill". Patient may have prescription filled one day early if pharmacy is closed on scheduled refill date.    Order Specific Question:   Supervising Provider    Answer:   Milinda Pointer [phone]  . cyclobenzaprine (FLEXERIL) 10 MG tablet    Sig: Take 1 tablet (10 mg total) by mouth at bedtime for 30 days.    Dispense:  30 tablet    Refill:  0    Do not place medication on "Automatic Refill". Fill one day early if pharmacy is closed on scheduled refill date.    Order Specific Question:   Supervising Provider    Answer:   Milinda Pointer [phone]  . gabapentin (NEURONTIN) 300 MG capsule    Sig: Take 1 capsule (300 mg total) by mouth 2 (two) times daily for 30 days.    Dispense:  60 capsule    Refill:  0    Do not place this medication, or any other prescription from our practice, on "Automatic Refill". Patient may have prescription filled one day early if pharmacy is closed on scheduled refill date.    Order Specific Question:   Supervising Provider    Answer:   Milinda Pointer [phone]  . promethazine (PHENERGAN) 12.5 MG tablet    Sig: Take 1 tablet (12.5 mg total) by mouth daily as needed for up to 30 days for nausea or vomiting. Each prescription to last a mth    Dispense:  10 tablet    Refill:  0    Order Specific Question:    Supervising Provider    Answer:  [place], [place] [[id number]]   Administered today: [name] had no medications administered during this visit.  Orders:  No orders of the defined types were placed in this encounter.  Follow-up plan:   Return in about 4 weeks (around 06/15/2018) for MedMgmt.   Not at this time.   Interventional options: Later I plan to bring him back for a diagnostic left-sided intra-articular SI joint injection.  If this provides him with good relief, then we will move onto RFA of the left SI.  Following that, we plan to do radiofrequency of the lumbar facets, bilaterally.   Considering:   Lower extremity EMG/PNCV. (Radiculopathy versus peripheral neuropathy) Upper extremity EMG/PNCV. Possibleright-sided L3 + L4 Lumbarsympathetic RFA Possible Racz procedure Diagnostic cervical epidural steroidinjection  Diagnostic bilateral cervical facet block  Possible bilateral cervical facet RFA Diagnostic bilateral lumbar facet block Possible bilateral lumbar facet RFA Diagnostic right intra-articular shoulder joint injection Diagnostic right suprascapular nerveblock Possibleright suprascapular RFA Diagnostic bilateral intra-articular knee joint injection Possible bilateral series of 5 Hyalgan knee injections Diagnostic bilateral Genicular nerve block Possible bilateral Genicular RFA Diagnostic bilateral greater occipital nerve block Possible bilateral greater occipital nerve RFA Diagnostic bilateral C2+TON nerve block Possible bilateral C2 + TON RFA    Note by: Dionisio David, NP Date: 05/18/2018; Time: 12:12 PM

## 2018-06-10 MED FILL — METOPROLOL SUCCINATE ER 100: 100 | 30 days supply | Qty: 30 | Fill #2

## 2018-06-15 ENCOUNTER — Ambulatory Visit: Payer: Self-pay | Attending: Nurse Practitioner | Admitting: Nurse Practitioner

## 2018-06-15 ENCOUNTER — Other Ambulatory Visit: Payer: Self-pay

## 2018-06-15 DIAGNOSIS — M533 Sacrococcygeal disorders, not elsewhere classified: Secondary | ICD-10-CM

## 2018-06-15 DIAGNOSIS — G8929 Other chronic pain: Secondary | ICD-10-CM

## 2018-06-15 DIAGNOSIS — M1611 Unilateral primary osteoarthritis, right hip: Secondary | ICD-10-CM

## 2018-06-15 DIAGNOSIS — M5416 Radiculopathy, lumbar region: Secondary | ICD-10-CM

## 2018-06-15 DIAGNOSIS — M7918 Myalgia, other site: Secondary | ICD-10-CM

## 2018-06-15 DIAGNOSIS — R110 Nausea: Secondary | ICD-10-CM

## 2018-06-15 DIAGNOSIS — G894 Chronic pain syndrome: Secondary | ICD-10-CM

## 2018-06-15 MED ORDER — TRAMADOL HCL 50 MG PO TABS: 50.0000 mg | ORAL_TABLET | Freq: Four times a day (QID) | ORAL | 0 refills | Status: DC

## 2018-06-15 MED ORDER — PROMETHAZINE HCL 12.5 MG PO TABS: 12.5000 mg | ORAL_TABLET | Freq: Every day | ORAL | 0 refills | Status: DC | PRN

## 2018-06-15 MED ORDER — GABAPENTIN 100 MG PO CAPS: 200.0000 mg | ORAL_CAPSULE | Freq: Two times a day (BID) | ORAL | 0 refills | Status: DC

## 2018-06-15 NOTE — Progress Notes (Signed)
Pain Management Encounter Note - Virtual Visit via Telephone Telehealth (real-time audio visits between healthcare provider and patient).  Patient's Phone No. & Preferred Pharmacy:  [phone] (home); [phone] (mobile); (Preferred) [phone]  [id number] [name], [facility] [place] [address] Phone: [phone] Fax: [phone]   Pre-screening note:  Our staff contacted Mark [name] and offered him an "in person", "face-to-face" appointment versus a telephone encounter. He indicated preferring the telephone encounter, at this time.  Reason for Virtual Visit: COVID-19*  Social distancing based on CDC and AMA recommendations.   I contacted Mark [name] on 06/15/2018 at 11:20 AM by telephone and clearly identified myself as Mark David, NP. I verified that I was speaking with the correct person using two identifiers (Name and date of birth: 10-Jun-1965).  Advanced Informed Consent I sought verbal advanced consent from Mark [name] for telemedicine interactions and virtual visit. I informed Mark [name] of the security and privacy concerns, risks, and limitations associated with performing an evaluation and management service by telephone. I also informed Mark [name] of the availability of "in person" appointments and I informed him of the possibility of a patient responsible charge related to this service. Mark [name] expressed understanding and agreed to proceed.   Historic Elements   Mark [name] is a 53 y.o. year old, male patient evaluated today after his last encounter by our practice on 05/18/2018. Mark [name]  has a past medical history of Alcohol withdrawal ([place]), Allergy, Anxiety (Dx 2014), COPD (chronic obstructive pulmonary disease) ([place]), Drug abuse ([place]), Enlarged prostate, ETOH abuse, GERD (gastroesophageal reflux disease) (Dx 2003), Headache(784.0), Hepatitis, Hyperlipidemia (Dx 2000), Hypertension (Dx 2011), IBS (irritable  bowel syndrome), Irregular heart beat, Long Q-T syndrome (01/23/2014), Mental disorder, Neuromuscular disorder ([place]), Neuropathy (06/07/2013), Seizure due to alcohol withdrawal ([place]) (01/23/2014), Shortness of breath, and Withdrawal seizures ([place]) (2014). He also  has a past surgical history that includes Back surgery (1995, 1999); Neck surgery (2000); Nasal sinus surgery; left heart catheterization with coronary angiogram (N/A, 01/13/2014); Subacromial decompression (Right, 11/16/2014); Nissen fundoplication; 24 hour ph study (N/A, 01/16/2015); Upper gastrointestinal endoscopy; Colonoscopy (1998); and Shoulder surgery (Right). Mark [name] has a current medication list which includes the following prescription(s): albuterol, cyclobenzaprine, famotidine, gabapentin, metoprolol tartrate, omeprazole, promethazine, tramadol, and trazodone. He  reports that he has been smoking cigarettes. He has a 15.00 pack-year smoking history. He has never used smokeless tobacco. He reports current alcohol use. He reports that he does not use drugs. Mark [name] is allergic to cozaar [losartan] and lisinopril.   HPI  I last saw him on 05/18/2018. He is being evaluated for medication management. He rates his pain 7/10, he states that this is generalized in his neck, back and feet. He admits that the feet pain is the worse today. He has peripheral neuropathy. He continues to wean off Gabapentin. He does not feel a difference in the current dose and he high dose. He has numbness and tingling. He has weakness with the right leg being worse. He feels like he also has back pain that radiates down the legs. He feels there are overlapping problems going on. He denies any changes with the pain . He admits that it is the same old. He is baby sitting his granddaughter who has been febrile 101.   Review of recent tests  DG C-Arm 1-60 Min-No Report Fluoroscopy was utilized by the requesting physician.  No radiographic  interpretation.     Office Visit on  04/20/2018  Component Date Value Ref Range Status  . Summary 04/20/2018 FINAL   Final   Comment: ==================================================================== TOXASSURE SELECT 13 (MW) ==================================================================== Test                             Result       Flag       Units Drug Present and Declared for Prescription Verification   Tramadol                       >4274        EXPECTED   ng/mg creat   O-Desmethyltramadol            >4274        EXPECTED   ng/mg creat   N-Desmethyltramadol            1461         EXPECTED   ng/mg creat    Source of tramadol is a prescription medication.    O-desmethyltramadol and N-desmethyltramadol are expected    metabolites of tramadol. ==================================================================== Test                      Result    Flag   Units      Ref Range   Creatinine              117              mg/dL      >=20 ==================================================================== Declared Medications:  The flagging and interpretation on this report are based on the  follo                          wing declared medications.  Unexpected results may arise from  inaccuracies in the declared medications.  **Note: The testing scope of this panel includes these medications:  Tramadol  **Note: The testing scope of this panel does not include following  reported medications:  Albuterol  Cyclobenzaprine  Famotidine  Gabapentin  Metoprolol  Omeprazole  Promethazine  Trazodone ==================================================================== For clinical consultation, please call [phone]. ====================================================================    Assessment  The primary encounter diagnosis was Chronic radicular low back pain [facility] source of pain) (Right) (to calf). Diagnoses of Chronic sacroiliac joint pain (Bilateral) (R>L), Osteoarthritis of hip  (Right), Chronic pain syndrome, Musculoskeletal pain, and Nausea in adult were also pertinent to this visit.  Plan of Care  I have discontinued Gaston Islam. Lubinski's gabapentin. I am also having him start on gabapentin. Additionally, I am having him maintain his metoprolol tartrate, albuterol, traZODone, omeprazole, famotidine, cyclobenzaprine, traMADol, and promethazine.  Pharmacotherapy (Medications Ordered): Meds ordered this encounter  Medications  . traMADol (ULTRAM) 50 MG tablet    Sig: Take 1-2 tablets (50-100 mg total) by mouth 4 (four) times daily for 30 days. Must last 30 days.    Dispense:  240 tablet    Refill:  0    Do not place this medication, or any other prescription from our practice, on "Automatic Refill". Patient may have prescription filled one day early if pharmacy is closed on scheduled refill date.    Order Specific Question:   Supervising Provider    Answer:   Milinda Pointer [phone]  . promethazine (PHENERGAN) 12.5 MG tablet    Sig: Take 1 tablet (12.5 mg total) by mouth daily as needed for up to 30 days for nausea  or vomiting. Each prescription to last a mth    Dispense:  10 tablet    Refill:  0    Order Specific Question:   Supervising Provider    Answer:   Milinda Pointer [phone]  . gabapentin (NEURONTIN) 100 MG capsule    Sig: Take 2 capsules (200 mg total) by mouth 2 (two) times daily for 30 days.    Dispense:  120 capsule    Refill:  0    Order Specific Question:   Supervising Provider    Answer:   Milinda Pointer [phone]   Orders:  No orders of the defined types were placed in this encounter.  Follow-up plan:   Return in about 4 weeks (around 07/13/2018) for MedMgmt.   I discussed the assessment and treatment plan with the patient. The patient was provided an opportunity to ask questions and all were answered. The patient agreed with the plan and demonstrated an understanding of the instructions.  Patient advised to call back or seek an  in-person evaluation if the symptoms or condition worsens.  Total duration of non-face-to-face encounter: 12 minutes.  Note by: Mark David, NP Date: 06/15/2018; Time: 11:20 AM  Disclaimer:  * Given the special circumstances of the COVID-19 pandemic, the federal government has announced that the Office for Civil Rights (OCR) will exercise its enforcement discretion and will not impose penalties on physicians using telehealth in the event of noncompliance with regulatory requirements under the [place] and [place] (HIPAA) in connection with the good faith provision of telehealth during the KMQKM-63 national public health emergency. ([place])

## 2018-07-05 ENCOUNTER — Other Ambulatory Visit: Payer: Self-pay | Admitting: Nurse Practitioner

## 2018-07-13 ENCOUNTER — Other Ambulatory Visit: Payer: Self-pay

## 2018-07-13 ENCOUNTER — Ambulatory Visit: Payer: Self-pay | Attending: Nurse Practitioner | Admitting: Nurse Practitioner

## 2018-07-13 DIAGNOSIS — R110 Nausea: Secondary | ICD-10-CM

## 2018-07-13 DIAGNOSIS — M542 Cervicalgia: Secondary | ICD-10-CM

## 2018-07-13 DIAGNOSIS — R208 Other disturbances of skin sensation: Secondary | ICD-10-CM

## 2018-07-13 DIAGNOSIS — M47816 Spondylosis without myelopathy or radiculopathy, lumbar region: Secondary | ICD-10-CM

## 2018-07-13 DIAGNOSIS — G8929 Other chronic pain: Secondary | ICD-10-CM

## 2018-07-13 DIAGNOSIS — M7918 Myalgia, other site: Secondary | ICD-10-CM

## 2018-07-13 DIAGNOSIS — G894 Chronic pain syndrome: Secondary | ICD-10-CM

## 2018-07-13 MED ORDER — TRAMADOL HCL 50 MG PO TABS: 50.0000 mg | ORAL_TABLET | Freq: Four times a day (QID) | ORAL | 0 refills | Status: DC

## 2018-07-13 MED ORDER — PROMETHAZINE HCL 12.5 MG PO TABS: 12.5000 mg | ORAL_TABLET | Freq: Every day | ORAL | 0 refills | Status: DC | PRN

## 2018-07-13 MED ORDER — CYCLOBENZAPRINE HCL 10 MG PO TABS: 10.0000 mg | ORAL_TABLET | Freq: Every day | ORAL | 0 refills | Status: DC

## 2018-07-13 MED ORDER — GABAPENTIN 100 MG PO CAPS: 200.0000 mg | ORAL_CAPSULE | Freq: Two times a day (BID) | ORAL | 0 refills | Status: DC

## 2018-07-13 NOTE — Patient Instructions (Signed)
____________________________________________________________________________________________  Medication Rules  Purpose: To inform patients, and their family members, of our rules and regulations.  Applies to: All patients receiving prescriptions (written or electronic).  Pharmacy of record: Pharmacy where electronic prescriptions will be sent. If written prescriptions are taken to a different pharmacy, please inform the nursing staff. The pharmacy listed in the electronic medical record should be the one where you would like electronic prescriptions to be sent.  Electronic prescriptions: In compliance with the [place] Strengthen Opioid Misuse Prevention (STOP) Act of 2017 (Session Law 2017-74/H243), effective March 11, 2018, all controlled substances must be electronically prescribed. Calling prescriptions to the pharmacy will cease to exist.  Prescription refills: Only during scheduled appointments. Applies to all prescriptions.  NOTE: The following applies primarily to controlled substances (Opioid* Pain Medications).   Patient's responsibilities: 1. Pain Pills: Bring all pain pills to every appointment (except for procedure appointments). 2. Pill Bottles: Bring pills in original pharmacy bottle. Always bring the newest bottle. Bring bottle, even if empty. 3. Medication refills: You are responsible for knowing and keeping track of what medications you take and those you need refilled. The day before your appointment: write a list of all prescriptions that need to be refilled. The day of the appointment: give the list to the admitting nurse. Prescriptions will be written only during appointments. No prescriptions will be written on procedure days. If you forget a medication: it will not be "Called in", "Faxed", or "electronically sent". You will need to get another appointment to get these prescribed. No early refills. Do not call asking to have your prescription filled  early. 4. Prescription Accuracy: You are responsible for carefully inspecting your prescriptions before leaving our office. Have the discharge nurse carefully go over each prescription with you, before taking them home. Make sure that your name is accurately spelled, that your address is correct. Check the name and dose of your medication to make sure it is accurate. Check the number of pills, and the written instructions to make sure they are clear and accurate. Make sure that you are given enough medication to last until your next medication refill appointment. 5. Taking Medication: Take medication as prescribed. When it comes to controlled substances, taking less pills or less frequently than prescribed is permitted and encouraged. Never take more pills than instructed. Never take medication more frequently than prescribed.  6. Inform other Doctors: Always inform, all of your healthcare providers, of all the medications you take. 7. Pain Medication from other Providers: You are not allowed to accept any additional pain medication from any other Doctor or Healthcare provider. There are two exceptions to this rule. (see below) In the event that you require additional pain medication, you are responsible for notifying us, as stated below. 8. Medication Agreement: You are responsible for carefully reading and following our Medication Agreement. This must be signed before receiving any prescriptions from our practice. Safely store a copy of your signed Agreement. Violations to the Agreement will result in no further prescriptions. (Additional copies of our Medication Agreement are available upon request.) 9. Laws, Rules, & Regulations: All patients are expected to follow all Federal and State Laws, Statutes, Rules, & Regulations. Ignorance of the Laws does not constitute a valid excuse. The use of any illegal substances is prohibited. 10. Adopted CDC guidelines & recommendations: Target dosing levels will be  at or below 60 MME/day. Use of benzodiazepines** is not recommended.  Exceptions: There are only two exceptions to the rule of not   receiving pain medications from other Healthcare Providers. 1. Exception #1 (Emergencies): In the event of an emergency (i.e.: accident requiring emergency care), you are allowed to receive additional pain medication. However, you are responsible for: As soon as you are able, call our office [phone], at any time of the day or night, and leave a message stating your name, the date and nature of the emergency, and the name and dose of the medication prescribed. In the event that your call is answered by a member of our staff, make sure to document and save the date, time, and the name of the person that took your information.  2. Exception #2 (Planned Surgery): In the event that you are scheduled by another doctor or dentist to have any type of surgery or procedure, you are allowed (for a period no longer than 30 days), to receive additional pain medication, for the acute post-op pain. However, in this case, you are responsible for picking up a copy of our "Post-op Pain Management for Surgeons" handout, and giving it to your surgeon or dentist. This document is available at our office, and does not require an appointment to obtain it. Simply go to our office during business hours (Monday-Thursday from 8:00 AM to 4:00 PM) (Friday 8:00 AM to 12:00 Noon) or if you have a scheduled appointment with us, prior to your surgery, and ask for it by name. In addition, you will need to provide us with your name, name of your surgeon, type of surgery, and date of procedure or surgery.  *Opioid medications include: morphine, codeine, oxycodone, oxymorphone, hydrocodone, hydromorphone, meperidine, tramadol, tapentadol, buprenorphine, fentanyl, methadone. **Benzodiazepine medications include: diazepam (Valium), alprazolam (Xanax), clonazepam (Klonopine), lorazepam (Ativan), clorazepate  (Tranxene), chlordiazepoxide (Librium), estazolam (Prosom), oxazepam (Serax), temazepam (Restoril), triazolam (Halcion) (Last updated: 05/08/2017) ____________________________________________________________________________________________    

## 2018-07-13 NOTE — Progress Notes (Signed)
Pain Management Encounter Note - Virtual Visit via Telephone Telehealth (real-time audio visits between healthcare provider and patient).  Patient's Phone No. & Preferred Pharmacy:  [phone] (home); [phone] (mobile); (Preferred) [phone]  [id number] [name], [facility] [place] [address] Phone: [phone] Fax: [phone]   Pre-screening note:  Our staff contacted Mark [name] and offered him an "in person", "face-to-face" appointment versus a telephone encounter. He indicated preferring the telephone encounter, at this time.  Reason for Virtual Visit: COVID-19*  Social distancing based on CDC and AMA recommendations.   I contacted Mark [name] on 07/13/2018 at 9:32 AM by telephone and clearly identified myself as Dionisio David, NP. I verified that I was speaking with the correct person using two identifiers (Name and date of birth: [birth year]).  Advanced Informed Consent I sought verbal advanced consent from Mark [name] for telemedicine interactions and virtual visit. I informed Mark [name] of the security and privacy concerns, risks, and limitations associated with performing an evaluation and management service by telephone. I also informed Mark [name] of the availability of "in person" appointments and I informed him of the possibility of a patient responsible charge related to this service. Mark [name] expressed understanding and agreed to proceed.   Historic Elements   Mark [name] is a 53 y.o. year old, male patient evaluated today after his last encounter by our practice on 07/05/2018. 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 He also  has a past surgical history that includes Back surgery (1995, 1999); Neck surgery (2000); Nasal sinus surgery; left heart catheterization with coronary angiogram (N/A, 01/13/2014); Subacromial decompression (Right, 11/16/2014); Nissen fundoplication; 24 hour ph study (N/A, 01/16/2015); Upper gastrointestinal endoscopy; Colonoscopy (1998); and Shoulder surgery (Right). Mark [name] has a current medication list which includes the following prescription(s): albuterol, cyclobenzaprine, famotidine, gabapentin, metoprolol tartrate, omeprazole, promethazine, tramadol, and trazodone. He  reports that he has been smoking cigarettes. He has a 15.00 pack-year smoking history. He has never used smokeless tobacco. He reports current alcohol use. He reports that he does not use drugs. Mark [name] is allergic to cozaar [losartan] and lisinopril.   HPI  I last saw him on 07/05/2018. He is being evaluated for medication management. he is having 7/10 neck and lower back. He has bilateral arm pain with weakness in his hands. He has numbness and tingling. He feels like it is mostly in the finger tips with the right hand being worse. He admits that his legs and feet pain is not as bad in the morning but t does increase as the day progresses.    Pharmacotherapy Assessment  Analgesic: Tramadol 100 mg 4 times a day (400 mg/dayof tramadol)  MME/day:40mg /day.   Monitoring: Pharmacotherapy: No side-effects or adverse reactions reported. [place] PMP: PDMP reviewed during this encounter.       Compliance: No problems identified. Plan: Refer to "POC".  Review of recent tests  DG C-Arm 1-60 Min-No Report Fluoroscopy was utilized by the requesting physician.  No radiographic   interpretation.    Office Visit on 04/20/2018  Component Date Value Ref Range Status  . Summary 04/20/2018 FINAL   Final   Comment: ==================================================================== TOXASSURE SELECT 13 (MW) ==================================================================== Test                             Result       Flag       Units Drug Present and Declared for Prescription Verification   Tramadol                       >4274        EXPECTED   ng/mg creat   O-Desmethyltramadol            >4274        EXPECTED   ng/mg creat   N-Desmethyltramadol            1461         EXPECTED   ng/mg creat    Source of tramadol is a prescription medication.    O-desmethyltramadol and N-desmethyltramadol are expected    metabolites of tramadol. ==================================================================== Test                      Result    Flag   Units      Ref Range   Creatinine              117              mg/dL      >=20 ==================================================================== Declared Medications:  The flagging and interpretation on this report are based on the  follo                          wing declared medications.  Unexpected results may arise from  inaccuracies in the declared medications.  **Note: The testing scope of this panel includes these medications:  Tramadol  **Note: The testing scope of this panel does not include following  reported medications:  Albuterol  Cyclobenzaprine  Famotidine  Gabapentin  Metoprolol  Omeprazole  Promethazine  Trazodone ==================================================================== For clinical consultation, please call [phone]. ====================================================================    Assessment  The primary encounter diagnosis was Chronic neck pain. Diagnoses of Spondylosis without myelopathy or radiculopathy, lumbar region, Allodynia (Lower extremities), Musculoskeletal  pain, Chronic pain syndrome, and Nausea in adult were also pertinent to this visit.  Plan of Care  I am having Mark [name] maintain his metoprolol tartrate, albuterol, traZODone, omeprazole, famotidine, cyclobenzaprine, gabapentin, promethazine, and traMADol.  Pharmacotherapy (Medications Ordered): Meds ordered this encounter  Medications  . cyclobenzaprine (FLEXERIL) 10 MG tablet    Sig: Take 1 tablet (10 mg total) by mouth at bedtime for 30 days.    Dispense:  30 tablet    Refill:  0    Do not place medication on "Automatic Refill". Fill one day early if pharmacy is closed on scheduled refill date.    Order Specific Question:   Supervising Provider    Answer:   Milinda Pointer [phone]  . gabapentin (NEURONTIN) 100 MG capsule    Sig: Take 2 capsules (200 mg total) by mouth 2 (two) times daily for 30 days.    Dispense:  120 capsule    Refill:  0    Order Specific Question:   Supervising Provider    Answer:   Milinda Pointer [phone]  . promethazine (PHENERGAN) 12.5 MG tablet    Sig: Take 1  tablet (12.5 mg total) by mouth daily as needed for up to 30 days for nausea or vomiting. Each prescription to last a mth    Dispense:  10 tablet    Refill:  0    Order Specific Question:   Supervising Provider    Answer:   Milinda Pointer [phone]  . traMADol (ULTRAM) 50 MG tablet    Sig: Take 1-2 tablets (50-100 mg total) by mouth 4 (four) times daily for 30 days. Must last 30 days.    Dispense:  240 tablet    Refill:  0    Do not place this medication, or any other prescription from our practice, on "Automatic Refill". Patient may have prescription filled one day early if pharmacy is closed on scheduled refill date.    Order Specific Question:   Supervising Provider    Answer:   Milinda Pointer [phone]   Orders:  No orders of the defined types were placed in this encounter.  Follow-up plan:   Return in about 4 weeks (around 08/10/2018) for MedMgmt.   I discussed the  assessment and treatment plan with the patient. The patient was provided an opportunity to ask questions and all were answered. The patient agreed with the plan and demonstrated an understanding of the instructions.  Patient advised to call back or seek an in-person evaluation if the symptoms or condition worsens.  Total duration of non-face-to-face encounter: 13 minutes.  Note by: Dionisio David, NP Date: 07/13/2018; Time: 9:50 AM  Disclaimer:  * Given the special circumstances of the COVID-19 pandemic, the federal government has announced that the Office for Civil Rights (OCR) will exercise its enforcement discretion and will not impose penalties on physicians using telehealth in the event of noncompliance with regulatory requirements under the [place] and [place] (HIPAA) in connection with the good faith provision of telehealth during the QJJHE-17 national public health emergency. ([place])

## 2018-08-10 ENCOUNTER — Ambulatory Visit: Payer: Self-pay | Attending: Pain Medicine | Admitting: Pain Medicine

## 2018-08-10 ENCOUNTER — Encounter: Payer: Self-pay | Admitting: Pain Medicine

## 2018-08-10 ENCOUNTER — Other Ambulatory Visit: Payer: Self-pay

## 2018-08-10 DIAGNOSIS — M7918 Myalgia, other site: Secondary | ICD-10-CM

## 2018-08-10 DIAGNOSIS — M5416 Radiculopathy, lumbar region: Secondary | ICD-10-CM

## 2018-08-10 DIAGNOSIS — G5793 Unspecified mononeuropathy of bilateral lower limbs: Secondary | ICD-10-CM

## 2018-08-10 DIAGNOSIS — M792 Neuralgia and neuritis, unspecified: Secondary | ICD-10-CM | POA: Insufficient documentation

## 2018-08-10 DIAGNOSIS — M5441 Lumbago with sciatica, right side: Secondary | ICD-10-CM

## 2018-08-10 DIAGNOSIS — R110 Nausea: Secondary | ICD-10-CM

## 2018-08-10 DIAGNOSIS — G894 Chronic pain syndrome: Secondary | ICD-10-CM

## 2018-08-10 DIAGNOSIS — G8929 Other chronic pain: Secondary | ICD-10-CM

## 2018-08-10 DIAGNOSIS — M5442 Lumbago with sciatica, left side: Secondary | ICD-10-CM

## 2018-08-10 MED ORDER — TRAMADOL HCL 50 MG PO TABS: 50.0000 mg | ORAL_TABLET | Freq: Four times a day (QID) | ORAL | 2 refills | Status: DC | PRN

## 2018-08-10 MED ORDER — GABAPENTIN 100 MG PO CAPS: 200.0000 mg | ORAL_CAPSULE | Freq: Three times a day (TID) | ORAL | 2 refills | Status: DC

## 2018-08-10 MED ORDER — PROMETHAZINE HCL 12.5 MG PO TABS: 12.5000 mg | ORAL_TABLET | Freq: Every day | ORAL | 2 refills | Status: DC | PRN

## 2018-08-10 MED ORDER — CYCLOBENZAPRINE HCL 10 MG PO TABS: 10.0000 mg | ORAL_TABLET | Freq: Every day | ORAL | 2 refills | Status: DC

## 2018-08-10 NOTE — [place] Notes (Signed)
Pain Management Virtual Encounter Note - Virtual Visit via Telephone Telehealth (real-time audio visits between healthcare provider and patient).  Patient's Phone No. & Preferred Pharmacy:  [phone] (home); [phone] (mobile); (Preferred) [phone] No e-mail address on record  [id number] [name], [place] [address] Phone: [phone] Fax: [phone]   Pre-screening note:  Our staff contacted [name] and offered him an "in person", "face-to-face" appointment versus a telephone encounter. He indicated preferring the telephone encounter, at this time.  Reason for Virtual Visit: COVID-19*  Social distancing based on CDC and AMA recommendations.   I contacted Mark [name] on 08/10/2018 at 10:32 AM via telephone.      I clearly identified myself as Mark Cola, MD. I verified that I was speaking with the correct person using two identifiers (Name and date of birth: May 25, 1965).  Advanced Informed Consent I sought verbal advanced consent from Mark [name] for virtual visit interactions. I informed [name] of possible security and privacy concerns, risks, and limitations associated with providing "not-in-person" medical evaluation and management services. I also informed [name] of the availability of "in-person" appointments. Finally, I informed him that there would be a charge for the virtual visit and that he could be  personally, fully or partially, financially responsible for it. [name] expressed understanding and agreed to proceed.   Historic Elements   [name] is a 53 y.o. year old, male patient evaluated today after his last encounter by our practice on 07/13/2018. [name]  has a past medical history of Alcohol withdrawal ([place]), Allergy, Anxiety (Dx 2014), COPD (chronic obstructive pulmonary disease) ([place]), Drug abuse ([place]), Enlarged prostate, ETOH abuse, GERD (gastroesophageal reflux  disease) (Dx 2003), Headache(784.0), Hepatitis, Hyperlipidemia (Dx 2000), Hypertension (Dx 2011), IBS (irritable bowel syndrome), Irregular heart beat, Long Q-T syndrome (01/23/2014), Mental disorder, Neuromuscular disorder ([place]), Neuropathy (06/07/2013), Seizure due to alcohol withdrawal ([place]) (01/23/2014), Shortness of breath, and Withdrawal seizures ([place]) (2014). He also  has a past surgical history that includes Back surgery (1995, 1999); Neck surgery (2000); Nasal sinus surgery; left heart catheterization with coronary angiogram (N/A, 01/13/2014); Subacromial decompression (Right, 11/16/2014); Nissen fundoplication; 24 hour ph study (N/A, 01/16/2015); Upper gastrointestinal endoscopy; Colonoscopy (1998); and Shoulder surgery (Right). [name] has a current medication list which includes the following prescription(s): albuterol, cyclobenzaprine, famotidine, gabapentin, losartan, metoprolol succinate, omeprazole, promethazine, tramadol, and trazodone. He  reports that he has been smoking cigarettes. He has a 15.00 pack-year smoking history. He has never used smokeless tobacco. He reports current alcohol use. He reports that he does not use drugs. [name] is allergic to cozaar [losartan] and lisinopril.   HPI  I last communicated with him on 05/06/2018. Today, he is being contacted for medication management.  Pharmacotherapy Assessment  Analgesic: Tramadol 50 mg 1 to 2 tablets every 6 hours (400 mg/day of tramadol) MME/day: 40 mg/day.   Monitoring: Pharmacotherapy: No side-effects or adverse reactions reported. [place] PMP: PDMP reviewed during this encounter.       Compliance: No problems identified. Effectiveness: Clinically acceptable. Plan: Refer to "POC".  Pertinent Labs  Renal Function Lab Results  Component Value Date   BUN 6 01/03/2018   CREATININE 0.65 01/03/2018   BCR 12 04/09/2017   GFRAA >60 01/03/2018   GFRNONAA >60 01/03/2018   Hepatic Function Lab Results  Component Value  Date   AST 43 (H) 01/03/2018   ALT 36 01/03/2018   ALBUMIN 3.8 01/03/2018  UDS Summary  Date Value Ref Range Status  04/20/2018 FINAL  Final    Comment:    ==================================================================== TOXASSURE SELECT 13 (MW) ==================================================================== Test                             Result       Flag       Units Drug Present and Declared for Prescription Verification   Tramadol                       >4274        EXPECTED   ng/mg creat   O-Desmethyltramadol            >4274        EXPECTED   ng/mg creat   N-Desmethyltramadol            1461         EXPECTED   ng/mg creat    Source of tramadol is a prescription medication.    O-desmethyltramadol and N-desmethyltramadol are expected    metabolites of tramadol. ==================================================================== Test                      Result    Flag   Units      Ref Range   Creatinine              117              mg/dL      >=20 ==================================================================== Declared Medications:  The flagging and interpretation on this report are based on the  following declared medications.  Unexpected results may arise from  inaccuracies in the declared medications.  **Note: The testing scope of this panel includes these medications:  Tramadol  **Note: The testing scope of this panel does not include following  reported medications:  Albuterol  Cyclobenzaprine  Famotidine  Gabapentin  Metoprolol  Omeprazole  Promethazine  Trazodone ==================================================================== For clinical consultation, please call [phone]. ====================================================================    Note: Above Lab results reviewed.  Recent imaging  DG C-Arm 1-60 Min-No Report Fluoroscopy was utilized by the requesting physician.  No radiographic  interpretation.   Assessment   The primary encounter diagnosis was Chronic pain syndrome. Diagnoses of Lower extremity neuropathy (Primary Source of Pain) (Bilateral) (R>L), Chronic low back pain (Secondary source of pain) (Bilateral) (R>L), Chronic radicular low back pain [facility] source of pain) (Right) (to calf), Musculoskeletal pain, Nausea in adult, and Neurogenic pain were also pertinent to this visit.  Plan of Care  I have discontinued Gaston Islam. Eskelson's metoprolol tartrate. I have also changed his traMADol, cyclobenzaprine, promethazine, and gabapentin. Additionally, I am having him maintain his albuterol, traZODone, omeprazole, famotidine, losartan, and metoprolol succinate.  Pharmacotherapy (Medications Ordered): Meds ordered this encounter  Medications  . traMADol (ULTRAM) 50 MG tablet    Sig: Take 1-2 tablets (50-100 mg total) by mouth every 6 (six) hours as needed for severe pain. Must last 30 days    Dispense:  240 tablet    Refill:  2    Chronic Pain. (STOP Act - Not applicable). Fill one day early if closed on scheduled refill date. Do not fill until: 08/20/18. To last until: 11/18/2018.  . cyclobenzaprine (FLEXERIL) 10 MG tablet    Sig: Take 1 tablet (10 mg total) by mouth at bedtime. Must last 30 days    Dispense:  30 tablet    Refill:  2    Fill one day early if pharmacy is closed on scheduled refill date. May substitute for generic if available.  . promethazine (PHENERGAN) 12.5 MG tablet    Sig: Take 1 tablet (12.5 mg total) by mouth daily as needed for nausea or vomiting. Must last 30 days    Dispense:  15 tablet    Refill:  2    Fill one day early if pharmacy is closed on scheduled refill date. May substitute for generic if available.  . gabapentin (NEURONTIN) 100 MG capsule    Sig: Take 2 capsules (200 mg total) by mouth 3 (three) times daily.    Dispense:  180 capsule    Refill:  2    Fill one day early if pharmacy is closed on scheduled refill date. May substitute for generic if available.    Orders:  No orders of the defined types were placed in this encounter.  Follow-up plan:   Return for (3 mo) Med-Mgmt, (Virtual Visit).    I discussed the assessment and treatment plan with the patient. The patient was provided an opportunity to ask questions and all were answered. The patient agreed with the plan and demonstrated an understanding of the instructions.  Patient advised to call back or seek an in-person evaluation if the symptoms or condition worsens.  Total duration of non-face-to-face encounter: 12 minutes.  Note by: Mark Cola, MD Date: 08/10/2018; Time: 10:57 AM  Note: This dictation was prepared with Dragon dictation. Any transcriptional errors that may result from this process are unintentional.  Disclaimer:  * Given the special circumstances of the COVID-19 pandemic, the federal government has announced that the Office for Civil Rights (OCR) will exercise its enforcement discretion and will not impose penalties on physicians using telehealth in the event of noncompliance with regulatory requirements under the [place] and [place] (HIPAA) in connection with the good faith provision of telehealth during the XUXYB-33 national public health emergency. ([place])

## 2018-08-10 NOTE — Patient Instructions (Signed)
____________________________________________________________________________________________  Medication Rules  Purpose: To inform patients, and their family members, of our rules and regulations.  Applies to: All patients receiving prescriptions (written or electronic).  Pharmacy of record: Pharmacy where electronic prescriptions will be sent. If written prescriptions are taken to a different pharmacy, please inform the nursing staff. The pharmacy listed in the electronic medical record should be the one where you would like electronic prescriptions to be sent.  Electronic prescriptions: In compliance with the [place] Strengthen Opioid Misuse Prevention (STOP) Act of 2017 (Session Law 2017-74/H243), effective March 11, 2018, all controlled substances must be electronically prescribed. Calling prescriptions to the pharmacy will cease to exist.  Prescription refills: Only during scheduled appointments. Applies to all prescriptions.  NOTE: The following applies primarily to controlled substances (Opioid* Pain Medications).   Patient's responsibilities: 1. Pain Pills: Bring all pain pills to every appointment (except for procedure appointments). 2. Pill Bottles: Bring pills in original pharmacy bottle. Always bring the newest bottle. Bring bottle, even if empty. 3. Medication refills: You are responsible for knowing and keeping track of what medications you take and those you need refilled. The day before your appointment: write a list of all prescriptions that need to be refilled. The day of the appointment: give the list to the admitting nurse. Prescriptions will be written only during appointments. No prescriptions will be written on procedure days. If you forget a medication: it will not be "Called in", "Faxed", or "electronically sent". You will need to get another appointment to get these prescribed. No early refills. Do not call asking to have your prescription filled  early. 4. Prescription Accuracy: You are responsible for carefully inspecting your prescriptions before leaving our office. Have the discharge nurse carefully go over each prescription with you, before taking them home. Make sure that your name is accurately spelled, that your address is correct. Check the name and dose of your medication to make sure it is accurate. Check the number of pills, and the written instructions to make sure they are clear and accurate. Make sure that you are given enough medication to last until your next medication refill appointment. 5. Taking Medication: Take medication as prescribed. When it comes to controlled substances, taking less pills or less frequently than prescribed is permitted and encouraged. Never take more pills than instructed. Never take medication more frequently than prescribed.  6. Inform other Doctors: Always inform, all of your healthcare providers, of all the medications you take. 7. Pain Medication from other Providers: You are not allowed to accept any additional pain medication from any other Doctor or Healthcare provider. There are two exceptions to this rule. (see below) In the event that you require additional pain medication, you are responsible for notifying us, as stated below. 8. Medication Agreement: You are responsible for carefully reading and following our Medication Agreement. This must be signed before receiving any prescriptions from our practice. Safely store a copy of your signed Agreement. Violations to the Agreement will result in no further prescriptions. (Additional copies of our Medication Agreement are available upon request.) 9. Laws, Rules, & Regulations: All patients are expected to follow all Federal and State Laws, Statutes, Rules, & Regulations. Ignorance of the Laws does not constitute a valid excuse. The use of any illegal substances is prohibited. 10. Adopted CDC guidelines & recommendations: Target dosing levels will be  at or below 60 MME/day. Use of benzodiazepines** is not recommended.  Exceptions: There are only two exceptions to the rule of not   receiving pain medications from other Healthcare Providers. 1. Exception #1 (Emergencies): In the event of an emergency (i.e.: accident requiring emergency care), you are allowed to receive additional pain medication. However, you are responsible for: As soon as you are able, call our office [phone], at any time of the day or night, and leave a message stating your name, the date and nature of the emergency, and the name and dose of the medication prescribed. In the event that your call is answered by a member of our staff, make sure to document and save the date, time, and the name of the person that took your information.  2. Exception #2 (Planned Surgery): In the event that you are scheduled by another doctor or dentist to have any type of surgery or procedure, you are allowed (for a period no longer than 30 days), to receive additional pain medication, for the acute post-op pain. However, in this case, you are responsible for picking up a copy of our "Post-op Pain Management for Surgeons" handout, and giving it to your surgeon or dentist. This document is available at our office, and does not require an appointment to obtain it. Simply go to our office during business hours (Monday-Thursday from 8:00 AM to 4:00 PM) (Friday 8:00 AM to 12:00 Noon) or if you have a scheduled appointment with us, prior to your surgery, and ask for it by name. In addition, you will need to provide us with your name, name of your surgeon, type of surgery, and date of procedure or surgery.  *Opioid medications include: morphine, codeine, oxycodone, oxymorphone, hydrocodone, hydromorphone, meperidine, tramadol, tapentadol, buprenorphine, fentanyl, methadone. **Benzodiazepine medications include: diazepam (Valium), alprazolam (Xanax), clonazepam (Klonopine), lorazepam (Ativan), clorazepate  (Tranxene), chlordiazepoxide (Librium), estazolam (Prosom), oxazepam (Serax), temazepam (Restoril), triazolam (Halcion) (Last updated: 05/08/2017) ____________________________________________________________________________________________   ____________________________________________________________________________________________  Medication Recommendations and Reminders  Applies to: All patients receiving prescriptions (written and/or electronic).  Medication Rules & Regulations: These rules and regulations exist for your safety and that of others. They are not flexible and neither are we. Dismissing or ignoring them will be considered "non-compliance" with medication therapy, resulting in complete and irreversible termination of such therapy. (See document titled "Medication Rules" for more details.) In all conscience, because of safety reasons, we cannot continue providing a therapy where the patient does not follow instructions.  Pharmacy of record:   Definition: This is the pharmacy where your electronic prescriptions will be sent.   We do not endorse any particular pharmacy.  You are not restricted in your choice of pharmacy.  The pharmacy listed in the electronic medical record should be the one where you want electronic prescriptions to be sent.  If you choose to change pharmacy, simply notify our nursing staff of your choice of new pharmacy.  Recommendations:  Keep all of your pain medications in a safe place, under lock and key, even if you live alone.   After you fill your prescription, take 1 week's worth of pills and put them away in a safe place. You should keep a separate, properly labeled bottle for this purpose. The remainder should be kept in the original bottle. Use this as your primary supply, until it runs out. Once it's gone, then you know that you have 1 week's worth of medicine, and it is time to come in for a prescription refill. If you do this correctly, it  is unlikely that you will ever run out of medicine.  To make sure that the above recommendation works,   it is very important that you make sure your medication refill appointments are scheduled at least 1 week before you run out of medicine. To do this in an effective manner, make sure that you do not leave the office without scheduling your next medication management appointment. Always ask the nursing staff to show you in your prescription , when your medication will be running out. Then arrange for the receptionist to get you a return appointment, at least 7 days before you run out of medicine. Do not wait until you have 1 or 2 pills left, to come in. This is very poor planning and does not take into consideration that we may need to cancel appointments due to bad weather, sickness, or emergencies affecting our staff.  "Partial Fill": If for any reason your pharmacy does not have enough pills/tablets to completely fill or refill your prescription, do not allow for a "partial fill". You will need a separate prescription to fill the remaining amount, which we will not provide. If the reason for the partial fill is your insurance, you will need to talk to the pharmacist about payment alternatives for the remaining tablets, but again, do not accept a partial fill.  Prescription refills and/or changes in medication(s):   Prescription refills, and/or changes in dose or medication, will be conducted only during scheduled medication management appointments. (Applies to both, written and electronic prescriptions.)  No refills on procedure days. No medication will be changed or started on procedure days. No changes, adjustments, and/or refills will be conducted on a procedure day. Doing so will interfere with the diagnostic portion of the procedure.  No phone refills. No medications will be "called into the pharmacy".  No Fax refills.  No weekend refills.  No Holliday refills.  No after hours  refills.  Remember:  Business hours are:  Monday to Thursday 8:00 AM to 4:00 PM Provider's Schedule: Crystal King, NP - Appointments are:  Medication management: Monday to Thursday 8:00 AM to 4:00 PM Danaly Bari, MD - Appointments are:  Medication management: Monday and Wednesday 8:00 AM to 4:00 PM Procedure day: Tuesday and Thursday 7:30 AM to 4:00 PM Bilal Lateef, MD - Appointments are:  Medication management: Tuesday and Thursday 8:00 AM to 4:00 PM Procedure day: Monday and Wednesday 7:30 AM to 4:00 PM (Last update: 05/08/2017) ____________________________________________________________________________________________   ____________________________________________________________________________________________  CANNABIDIOL (AKA: CBD Oil or Pills)  Applies to: All patients receiving prescriptions of controlled substances (written and/or electronic).  General Information: Cannabidiol (CBD) was discovered in 1940. It is one of some 113 identified cannabinoids in cannabis (Marijuana) plants, accounting for up to 40% of the plant's extract. As of 2018, preliminary clinical research on cannabidiol included studies of anxiety, cognition, movement disorders, and pain.  Cannabidiol is consummed in multiple ways, including inhalation of cannabis smoke or vapor, as an aerosol spray into the cheek, and by mouth. It may be supplied as CBD oil containing CBD as the active ingredient (no added tetrahydrocannabinol (THC) or terpenes), a full-plant CBD-dominant hemp extract oil, capsules, dried cannabis, or as a liquid solution. CBD is thought not have the same psychoactivity as THC, and may affect the actions of THC. Studies suggest that CBD may interact with different biological targets, including cannabinoid receptors and other neurotransmitter receptors. As of 2018 the mechanism of action for its biological effects has not been determined.  In the [place], cannabidiol has a limited  approval by the Food and Drug Administration (FDA) for treatment of only two types   of epilepsy disorders. The side effects of long-term use of the drug include somnolence, decreased appetite, diarrhea, fatigue, malaise, weakness, sleeping problems, and others.  CBD remains a Schedule I drug prohibited for any use.  Legality: Some manufacturers ship CBD products nationally, an illegal action which the FDA has not enforced in 2018, with CBD remaining the subject of an FDA investigational new drug evaluation, and is not considered legal as a dietary supplement or food ingredient as of December 2018. Federal illegality has made it difficult historically to conduct research on CBD. CBD is openly sold in head shops and health food stores in some states where such sales have not been explicitly legalized.  Warning: Because it is not FDA approved for general use or treatment of pain, it is not required to undergo the same manufacturing controls as prescription drugs.  This means that the available cannabidiol (CBD) may be contaminated with THC.  If this is the case, it will trigger a positive urine drug screen (UDS) test for cannabinoids (Marijuana).  Because a positive UDS for illicit substances is a violation of our medication agreement, your opioid analgesics (pain medicine) may be permanently discontinued. (Last update: 05/29/2017) ____________________________________________________________________________________________    

## 2018-08-15 ENCOUNTER — Encounter (HOSPITAL_COMMUNITY): Payer: Self-pay

## 2018-08-15 ENCOUNTER — Other Ambulatory Visit: Payer: Self-pay

## 2018-08-15 ENCOUNTER — Inpatient Hospital Stay (HOSPITAL_COMMUNITY)
Admission: EM | Admit: 2018-08-15 | Discharge: 2018-08-17 | DRG: 897 | Disposition: A | Discharge status: Home / Self Care | Payer: Self-pay | Attending: Internal Medicine | Admitting: Internal Medicine

## 2018-08-15 DIAGNOSIS — Z720 Tobacco use: Secondary | ICD-10-CM

## 2018-08-15 DIAGNOSIS — R7989 Other specified abnormal findings of blood chemistry: Secondary | ICD-10-CM

## 2018-08-15 DIAGNOSIS — F17200 Nicotine dependence, unspecified, uncomplicated: Secondary | ICD-10-CM | POA: Diagnosis present

## 2018-08-15 DIAGNOSIS — I10 Essential (primary) hypertension: Secondary | ICD-10-CM | POA: Diagnosis present

## 2018-08-15 DIAGNOSIS — R945 Abnormal results of liver function studies: Secondary | ICD-10-CM

## 2018-08-15 DIAGNOSIS — F10939 Alcohol use, unspecified with withdrawal, unspecified: Secondary | ICD-10-CM | POA: Diagnosis present

## 2018-08-15 DIAGNOSIS — I426 Alcoholic cardiomyopathy: Secondary | ICD-10-CM | POA: Diagnosis present

## 2018-08-15 DIAGNOSIS — F10930 Alcohol use, unspecified with withdrawal, uncomplicated: Secondary | ICD-10-CM

## 2018-08-15 DIAGNOSIS — F17210 Nicotine dependence, cigarettes, uncomplicated: Secondary | ICD-10-CM | POA: Diagnosis present

## 2018-08-15 DIAGNOSIS — G8929 Other chronic pain: Secondary | ICD-10-CM | POA: Diagnosis present

## 2018-08-15 DIAGNOSIS — I959 Hypotension, unspecified: Secondary | ICD-10-CM | POA: Diagnosis present

## 2018-08-15 DIAGNOSIS — J449 Chronic obstructive pulmonary disease, unspecified: Secondary | ICD-10-CM | POA: Diagnosis present

## 2018-08-15 DIAGNOSIS — E785 Hyperlipidemia, unspecified: Secondary | ICD-10-CM | POA: Diagnosis present

## 2018-08-15 DIAGNOSIS — E871 Hypo-osmolality and hyponatremia: Secondary | ICD-10-CM

## 2018-08-15 DIAGNOSIS — F1020 Alcohol dependence, uncomplicated: Secondary | ICD-10-CM | POA: Diagnosis present

## 2018-08-15 DIAGNOSIS — I110 Hypertensive heart disease with heart failure: Secondary | ICD-10-CM | POA: Diagnosis present

## 2018-08-15 DIAGNOSIS — I5042 Chronic combined systolic (congestive) and diastolic (congestive) heart failure: Secondary | ICD-10-CM | POA: Diagnosis present

## 2018-08-15 DIAGNOSIS — M5441 Lumbago with sciatica, right side: Secondary | ICD-10-CM

## 2018-08-15 DIAGNOSIS — K219 Gastro-esophageal reflux disease without esophagitis: Secondary | ICD-10-CM | POA: Diagnosis present

## 2018-08-15 DIAGNOSIS — F419 Anxiety disorder, unspecified: Secondary | ICD-10-CM | POA: Diagnosis present

## 2018-08-15 DIAGNOSIS — Z1159 Encounter for screening for other viral diseases: Secondary | ICD-10-CM

## 2018-08-15 DIAGNOSIS — M545 Low back pain: Secondary | ICD-10-CM | POA: Diagnosis present

## 2018-08-15 DIAGNOSIS — F10239 Alcohol dependence with withdrawal, unspecified: Principal | ICD-10-CM | POA: Diagnosis present

## 2018-08-15 DIAGNOSIS — M5442 Lumbago with sciatica, left side: Secondary | ICD-10-CM

## 2018-08-15 DIAGNOSIS — F10230 Alcohol dependence with withdrawal, uncomplicated: Secondary | ICD-10-CM

## 2018-08-15 HISTORY — DX: Other specified abnormal findings of blood chemistry: R79.89

## 2018-08-15 HISTORY — DX: Hypo-osmolality and hyponatremia: E87.1

## 2018-08-15 LAB — COMPREHENSIVE METABOLIC PANEL
ALT: 81 U/L — ABNORMAL HIGH (ref 0–44)
AST: 99 U/L — ABNORMAL HIGH (ref 15–41)
Albumin: 4.4 g/dL (ref 3.5–5.0)
Alkaline Phosphatase: 119 U/L (ref 38–126)
Anion gap: 14 (ref 5–15)
BUN: <5 mg/dL — ABNORMAL LOW (ref 6–20)
CO2: 22 mmol/L (ref 22–32)
Calcium: 9 mg/dL (ref 8.9–10.3)
Chloride: 94 mmol/L — ABNORMAL LOW (ref 98–111)
Creatinine, Ser: 0.56 mg/dL — ABNORMAL LOW (ref 0.61–1.24)
GFR calc Af Amer: >60 mL/min (ref >60)
GFR calc non Af Amer: >60 mL/min (ref >60)
Glucose, Bld: 111 mg/dL — ABNORMAL HIGH (ref 70–99)
Potassium: 3.8 mmol/L (ref 3.5–5.1)
Sodium: 130 mmol/L — ABNORMAL LOW (ref 135–145)
Total Bilirubin: 0.6 mg/dL (ref 0.3–1.2)
Total Protein: 7.7 g/dL (ref 6.5–8.1)

## 2018-08-15 LAB — CBC WITH DIFFERENTIAL/PLATELET
Abs Immature Granulocytes: 0.11 10*3/uL — ABNORMAL HIGH (ref 0.00–0.07)
Basophils Absolute: 0.1 10*3/uL (ref 0.0–0.1)
Basophils Relative: 1 %
Eosinophils Absolute: 0.1 10*3/uL (ref 0.0–0.5)
Eosinophils Relative: 1 %
HCT: 55.4 % — ABNORMAL HIGH (ref 39.0–52.0)
Hemoglobin: 19.3 g/dL — ABNORMAL HIGH (ref 13.0–17.0)
Immature Granulocytes: 1 %
Lymphocytes Relative: 25 %
Lymphs Abs: 2.2 10*3/uL (ref 0.7–4.0)
MCH: 29.5 pg (ref 26.0–34.0)
MCHC: 34.8 g/dL (ref 30.0–36.0)
MCV: 84.7 fL (ref 80.0–100.0)
Monocytes Absolute: 0.7 10*3/uL (ref 0.1–1.0)
Monocytes Relative: 8 %
Neutro Abs: 5.5 10*3/uL (ref 1.7–7.7)
Neutrophils Relative %: 64 %
Platelets: 287 10*3/uL (ref 150–400)
RBC: 6.54 MIL/uL — ABNORMAL HIGH (ref 4.22–5.81)
RDW: 13.3 % (ref 11.5–15.5)
WBC: 8.7 10*3/uL (ref 4.0–10.5)
nRBC: 0 % (ref 0.0–0.2)

## 2018-08-15 LAB — SARS CORONAVIRUS 2 BY RT PCR (HOSPITAL ORDER, PERFORMED IN ~~LOC~~ HOSPITAL LAB): SARS Coronavirus 2: NEGATIVE

## 2018-08-15 LAB — ETHANOL: Alcohol, Ethyl (B): 71 mg/dL — ABNORMAL HIGH (ref <10)

## 2018-08-15 LAB — RAPID URINE DRUG SCREEN, HOSP PERFORMED
Amphetamines: NOT DETECTED
Barbiturates: NOT DETECTED
Benzodiazepines: NOT DETECTED
Cocaine: NOT DETECTED
Opiates: NOT DETECTED
Tetrahydrocannabinol: NOT DETECTED

## 2018-08-15 LAB — SODIUM, URINE, RANDOM: Sodium, Ur: 15 mmol/L

## 2018-08-15 LAB — PROTIME-INR
INR: 0.9 (ref 0.8–1.2)
Prothrombin Time: 12.3 seconds (ref 11.4–15.2)

## 2018-08-15 LAB — CREATININE, URINE, RANDOM: Creatinine, Urine: 22.92 mg/dL

## 2018-08-15 LAB — LIPASE, BLOOD: Lipase: 42 U/L (ref 11–51)

## 2018-08-15 LAB — OSMOLALITY, URINE: Osmolality, Ur: 97 mOsm/kg — ABNORMAL LOW (ref 300–900)

## 2018-08-15 MED ORDER — METOPROLOL TARTRATE 5 MG/5ML IV SOLN
5.0000 mg | INTRAVENOUS | Status: AC | PRN
  Administered 2018-08-16 (×2): 5 mg via INTRAVENOUS
  Filled 2018-08-15 (×2): qty 5

## 2018-08-15 MED ORDER — GABAPENTIN 100 MG PO CAPS: 200.0000 mg | ORAL_CAPSULE | Freq: Three times a day (TID) | ORAL | Status: DC

## 2018-08-15 MED ORDER — GUAIFENESIN ER 600 MG PO TB12
600.0000 mg | ORAL_TABLET | Freq: Two times a day (BID) | ORAL | Status: DC
  Administered 2018-08-16 – 2018-08-17 (×4): 600 mg via ORAL
  Filled 2018-08-15 (×4): qty 1

## 2018-08-15 MED ORDER — ALBUTEROL SULFATE HFA 108 (90 BASE) MCG/ACT IN AERS: 2.0000 | INHALATION_SPRAY | Freq: Four times a day (QID) | RESPIRATORY_TRACT | Status: DC | PRN

## 2018-08-15 MED ORDER — LORAZEPAM 2 MG/ML IJ SOLN
2.0000 mg | INTRAMUSCULAR | Status: DC | PRN
  Administered 2018-08-16 – 2018-08-17 (×7): 2 mg via INTRAVENOUS
  Filled 2018-08-15 (×7): qty 1

## 2018-08-15 MED ORDER — SODIUM CHLORIDE 0.9 % IV SOLN
INTRAVENOUS | Status: DC
  Administered 2018-08-16 (×2): via INTRAVENOUS

## 2018-08-15 MED ORDER — LORAZEPAM 2 MG/ML IJ SOLN
2.0000 mg | Freq: Once | INTRAMUSCULAR | Status: AC
  Administered 2018-08-15: 2 mg via INTRAVENOUS
  Filled 2018-08-15: qty 1

## 2018-08-15 MED ORDER — SODIUM CHLORIDE 0.9 % IV SOLN: INTRAVENOUS | Status: AC

## 2018-08-15 MED ORDER — HYOSCYAMINE SULFATE 0.125 MG SL SUBL
0.2500 mg | SUBLINGUAL_TABLET | Freq: Once | SUBLINGUAL | Status: AC
  Administered 2018-08-15: 0.25 mg via SUBLINGUAL
  Filled 2018-08-15: qty 2

## 2018-08-15 MED ORDER — TRAZODONE HCL 50 MG PO TABS
50.0000 mg | ORAL_TABLET | Freq: Every evening | ORAL | Status: DC | PRN
  Administered 2018-08-16: 50 mg via ORAL
  Administered 2018-08-16: 100 mg via ORAL
  Filled 2018-08-15: qty 2
  Filled 2018-08-15: qty 1

## 2018-08-15 MED ORDER — ACETAMINOPHEN 650 MG RE SUPP: 650.0000 mg | Freq: Four times a day (QID) | RECTAL | Status: DC | PRN

## 2018-08-15 MED ORDER — ACETAMINOPHEN 325 MG PO TABS
650.0000 mg | ORAL_TABLET | Freq: Four times a day (QID) | ORAL | Status: DC | PRN
  Administered 2018-08-16: 650 mg via ORAL
  Filled 2018-08-15: qty 2

## 2018-08-15 MED ORDER — ADULT MULTIVITAMIN W/MINERALS CH
1.0000 | ORAL_TABLET | Freq: Every day | ORAL | Status: DC
  Administered 2018-08-16 – 2018-08-17 (×2): 1 via ORAL
  Filled 2018-08-15 (×2): qty 1

## 2018-08-15 MED ORDER — IPRATROPIUM-ALBUTEROL 0.5-2.5 (3) MG/3ML IN SOLN: 3.0000 mL | Freq: Four times a day (QID) | RESPIRATORY_TRACT | Status: DC | PRN

## 2018-08-15 MED ORDER — THIAMINE HCL 100 MG/ML IJ SOLN
100.0000 mg | Freq: Once | INTRAMUSCULAR | Status: AC
  Administered 2018-08-15: 100 mg via INTRAVENOUS
  Filled 2018-08-15: qty 2

## 2018-08-15 MED ORDER — VITAMIN B-1 100 MG PO TABS
100.0000 mg | ORAL_TABLET | Freq: Every day | ORAL | Status: DC
  Administered 2018-08-16 – 2018-08-17 (×2): 100 mg via ORAL
  Filled 2018-08-15 (×2): qty 1

## 2018-08-15 MED ORDER — FOLIC ACID 1 MG PO TABS
1.0000 mg | ORAL_TABLET | Freq: Every day | ORAL | Status: DC
  Administered 2018-08-16 – 2018-08-17 (×2): 1 mg via ORAL
  Filled 2018-08-15 (×2): qty 1

## 2018-08-15 MED ORDER — LIDOCAINE VISCOUS HCL 2 % MT SOLN
15.0000 mL | Freq: Once | OROMUCOSAL | Status: AC
  Administered 2018-08-15: 15 mL via ORAL
  Filled 2018-08-15: qty 15

## 2018-08-15 MED ORDER — ALUM & MAG HYDROXIDE-SIMETH 200-200-20 MG/5ML PO SUSP
30.0000 mL | Freq: Once | ORAL | Status: AC
  Administered 2018-08-15: 30 mL via ORAL
  Filled 2018-08-15: qty 30

## 2018-08-15 MED ORDER — NICOTINE 21 MG/24HR TD PT24
21.0000 mg | MEDICATED_PATCH | Freq: Every day | TRANSDERMAL | Status: DC
  Administered 2018-08-16 – 2018-08-17 (×4): 21 mg via TRANSDERMAL
  Filled 2018-08-15 (×3): qty 1

## 2018-08-15 NOTE — ED Provider Notes (Signed)
[facility] DEPT Provider Note   CSN: [id number] Arrival date & time: 08/15/18  1539    History   Chief Complaint Chief Complaint  Patient presents with  . Medical Clearance    HPI Mark [name] is a 53 y.o. male.     53 year old male with history of alcohol abuse presents requesting detox from alcohol.  Patient states his last drink was today several hours ago where he had a 12 pack.  Patient states he normally drinks between 12 and 18 beers per day.  Denies any SI or HI.  No hallucinations.  Denies any tremors.  Has had nausea but no vomiting.  Denies any illicit drug use.  Patient states that he has been drinking more recently due to chronic pain.  Was seen at pain management several days ago and his medications were adjusted.     Past Medical History:  Diagnosis Date  . Alcohol withdrawal ([place])   . Allergy   . Anxiety Dx 2014  . COPD (chronic obstructive pulmonary disease) ([place])   . Drug abuse ([place])    pt reports opioid dependence due to previous back surgeries  . Enlarged prostate   . ETOH abuse   . GERD (gastroesophageal reflux disease) Dx 2003  . Headache(784.0)   . Hepatitis   . Hyperlipidemia Dx 2000  . Hypertension Dx 2011  . IBS (irritable bowel syndrome)    alternates constiaption/ diarrhea  . Irregular heart beat   . 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 Mental disorder   . Neuromuscular disorder ([place])    hands hurt   . Neuropathy 06/07/2013  . Seizure due to alcohol withdrawal ([place]) 01/23/2014   Per patient report  . Shortness of breath   . Withdrawal seizures ([place]) 2014   last seizure 2014 etoh with drawal    Patient Active Problem List   Diagnosis Date Noted  . Neurogenic pain 08/10/2018  . Osteoarthritis of hip (Right) 04/07/2018  . Spondylosis without myelopathy or radiculopathy, lumbar region 03/23/2018  . Other specified dorsopathies, sacral and sacrococcygeal region 03/23/2018  . Chronic hip pain (Right)  03/23/2018  . Chronic sacroiliac joint pain (Left) 03/23/2018  . Alcohol withdrawal ([place]) 12/31/2017  . Dyspepsia   . Opioid-induced hyperalgesia (with oxycodone) 02/11/2017  . Allodynia (Lower extremities) 12/26/2016  . Hyperalgesia (Lower extremities) 12/26/2016  . Sympathetic pain (lower extremity) 09/26/2016  . Lower extremity neuropathy (Primary Source of Pain) (Bilateral) (R>L) 09/26/2016  . Lumbar facet syndrome (Bilateral) (R>L) 08/22/2016  . Chronic sacroiliac joint pain (Bilateral) (R>L) 08/22/2016  . Chronic lower extremity pain (Bilateral) (R>L) 06/26/2016  . Opiate use 05/14/2016  . Occipital headache (Bilateral) (R>L) 04/16/2016  . Musculoskeletal pain 04/15/2016  . 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 Major depressive disorder, recurrent episode, moderate ([place]) 04/10/2016  . Long term current use of opiate analgesic 03/18/2016  . Long term prescription opiate use 03/18/2016  . Chronic neck pain (Bilateral)(R>L) 03/18/2016  . Cervical fusion syndrome 03/18/2016  . 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 Chronic knee pain  (Bilateral) (R>L) 03/18/2016  . GAD (generalized anxiety disorder) 12/06/2015  . Alcohol use disorder, severe, in early remission ([place]) 12/06/2015  . GERD (gastroesophageal reflux disease) 07/24/2015  . Chronic anxiety 12/19/2014  . Erectile dysfunction 09/16/2014  . Poor dentition 07/28/2014  . Chronic maxillary sinusitis 07/28/2014  . Vitamin D insufficiency 07/05/2014  . Chronic fatigue 07/04/2014  . Chronic radicular low back pain [facility] source of pain) (Right) (to calf) 06/02/2014  .  Chronic shoulder pain (Right) 05/12/2014  . Major depressive disorder, recurrent episode, severe ([place])   . Alcohol use disorder, severe, dependence ([place])   . 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 Hypokalemia   . Opioid dependence ([place]) 01/22/2014    Class: Acute  . Paroxysmal VT ([place]) 12/19/2013  . Chronic low back pain (Secondary source of  pain) (Bilateral) (R>L) 12/28/2012  . Chronic pain syndrome 12/28/2012  . Diarrhea 12/28/2012  . Alcohol intoxication ([place]) 06/09/2012  . 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 Nonsustained ventricular tachycardia ([place]) 01/20/2012  . Tobacco abuse 01/19/2012  . Essential hypertension   . Hyperlipidemia     Past Surgical History:  Procedure Laterality Date  . [place] STUDY N/A 01/16/2015   Procedure: [place] STUDY;  Surgeon: Manus Gunning, MD;  Location: [facility] ENDOS[facility];  Service: Gastroenterology;  Laterality: N/A;  . [place]  . COLONOSCOPY  1998  . LEFT HEART CATHETERIZATION WITH CORONARY ANGIOGRAM N/A 01/13/2014   Procedure: LEFT HEART CATHETERIZATION WITH CORONARY ANGIOGRAM;  Surgeon: Clent Demark, MD;  Location: [facility]ATH [facility];  Service: Cardiovascular;  Laterality: N/A;  . NASAL SINUS SURGERY    . NECK SURGERY  2000   cervial fusion   . NISSEN FUNDOPLICATION    . SHOULDER SURGERY Right   . SUBACROMIAL DECOMPRESSION Right 11/16/2014   Procedure: RIGHT SHOULDER ARTHROSCOPY WITH SUBACROMIAL DECOMPRESSION ;  Surgeon: Leandrew Koyanagi, MD;  Location: [facility];  Service: Orthopedics;  Laterality: Right;  . UPPER GASTROINTESTINAL ENDOSCOPY          Home Medications    Prior to Admission medications   Medication Sig Start Date End Date Taking? Authorizing Provider  albuterol (VENTOLIN HFA) 108 (90 Base) MCG/ACT inhaler Inhale 2 puffs into the lungs every 6 (six) hours as needed for wheezing or shortness of breath. 08/12/17   Ladell Pier, MD  cyclobenzaprine (FLEXERIL) 10 MG tablet Take 1 tablet (10 mg total) by mouth at bedtime. Must last 30 days 08/20/18 11/18/18  Milinda Pointer, MD  famotidine (PEPCID AC) 10 MG chewable tablet Chew 10 mg by mouth as needed for heartburn.    [provider]  gabapentin (NEURONTIN) 100 MG capsule Take 2 capsules (200 mg total) by mouth 3 (three) times daily. 08/20/18  11/18/18  Milinda Pointer, MD  losartan (COZAAR) 100 MG tablet Take 100 mg by mouth daily. 07/03/18   [provider]  metoprolol succinate (TOPROL-XL) 100 MG 24 hr tablet TAKE 1 TABLET (100 MG) BY MOUTH ROUTE ONCE IN THE EVENING 07/03/18   [provider]  omeprazole (PRILOSEC OTC) 20 MG tablet Take 20 mg by mouth 2 (two) times daily.    [provider]  promethazine (PHENERGAN) 12.5 MG tablet Take 1 tablet (12.5 mg total) by mouth daily as needed for nausea or vomiting. Must last 30 days 08/20/18 11/18/18  Milinda Pointer, MD  traMADol (ULTRAM) 50 MG tablet Take 1-2 tablets (50-100 mg total) by mouth every 6 (six) hours as needed for severe pain. Must last 30 days 08/20/18 11/18/18  Milinda Pointer, MD  traZODone (DESYREL) 100 MG tablet Take 1 tablet (100 mg total) by mouth at bedtime as needed. Patient taking differently: Take 50-100 mg by mouth at bedtime as needed for sleep.  08/21/17   Ladell Pier, MD    Family History Family History  Problem Relation Age of Onset  . Lung cancer Father  was a smoker  . Alcohol abuse Father   . Hypertension Mother   . Colon polyps Mother   . Breast cancer Paternal Grandmother   . Alcohol abuse Paternal Grandfather   . Colon cancer Neg Hx   . Rectal cancer Neg Hx   . Stomach cancer Neg Hx     Social History Social History   Tobacco Use  . Smoking status: Current Every Day Smoker    Packs/day: 0.50    Years: 30.00    Pack years: 15.00    Types: Cigarettes  . Smokeless tobacco: Never Used  Substance Use Topics  . Alcohol use: Yes    Alcohol/week: 0.0 standard drinks    Comment: Case a beer a day   . Drug use: No    Comment: Opana     Allergies   Cozaar [losartan] and Lisinopril   Review of Systems Review of Systems  All other systems reviewed and are negative.    Physical Exam Updated Vital Signs BP (!) 156/95 (BP Location: Left Arm)   Pulse (!) 54   Temp 98.1 F (36.7 C) (Oral)    Resp 14   Ht 1.93 m (6\' 4" )   Wt 99.8 kg   SpO2 98%   BMI 26.78 kg/m   Physical Exam Vitals signs and nursing note reviewed.  Constitutional:      General: He is not in acute distress.    Appearance: Normal appearance. He is well-developed. He is not toxic-appearing.  HENT:     Head: Normocephalic and atraumatic.  Eyes:     General: Lids are normal.     Conjunctiva/sclera: Conjunctivae normal.     Pupils: Pupils are equal, round, and reactive to light.  Neck:     Musculoskeletal: Normal range of motion and neck supple.     Thyroid: No thyroid mass.     Trachea: No tracheal deviation.  Cardiovascular:     Rate and Rhythm: Normal rate and regular rhythm.     Heart sounds: Normal heart sounds. No murmur. No gallop.   Pulmonary:     Effort: Pulmonary effort is normal. No respiratory distress.     Breath sounds: Normal breath sounds. No stridor. No decreased breath sounds, wheezing, rhonchi or rales.  Abdominal:     General: Bowel sounds are normal. There is no distension.     Palpations: Abdomen is soft.     Tenderness: There is no abdominal tenderness. There is no rebound.  Musculoskeletal: Normal range of motion.        General: No tenderness.  Skin:    General: Skin is warm and dry.     Findings: No abrasion or rash.  Neurological:     Mental Status: He is alert and oriented to person, place, and time.     GCS: GCS eye subscore is 4. GCS verbal subscore is 5. GCS motor subscore is 6.     Cranial Nerves: No cranial nerve deficit.     Sensory: No sensory deficit.  Psychiatric:        Attention and Perception: Attention normal.        Mood and Affect: Mood normal.        Speech: Speech normal.        Behavior: Behavior normal.        Thought Content: Thought content does not include homicidal or suicidal ideation. Thought content does not include homicidal or suicidal plan.      ED Treatments / Results  Labs (all  labs ordered are listed, but only abnormal results are  displayed) Labs Reviewed  RAPID URINE DRUG SCREEN, HOSP PERFORMED  ETHANOL  CBC WITH DIFFERENTIAL/PLATELET  COMPREHENSIVE METABOLIC PANEL  LIPASE, BLOOD    EKG EKG Interpretation  Date/Time:  Saturday August 15 2018 18:08:08 EDT Ventricular Rate:  82 PR Interval:    QRS Duration: 109 QT Interval:  413 QTC Calculation: 483 R Axis:   60 Text Interpretation:  Sinus rhythm Borderline prolonged QT interval Confirmed by Lacretia Leigh ([id number]) on 08/15/2018 6:16:31 PM   Radiology No results found.  Procedures Procedures (including critical care time)  Medications Ordered in ED Medications  alum & mag hydroxide-simeth (MAALOX/MYLANTA) 200-200-20 MG/5ML suspension 30 mL (has no administration in time range)    And  lidocaine (XYLOCAINE) 2 % viscous mouth solution 15 mL (has no administration in time range)  hyoscyamine (LEVSIN SL) SL tablet 0.25 mg (has no administration in time range)     Initial Impression / Assessment and Plan / ED Course  I have reviewed the triage vital signs and the nursing notes.  Pertinent labs & imaging results that were available during my care of the patient were reviewed by me and considered in my medical decision making (see chart for details).       Patient is EKG noted and shows no signs of bradycardia. Patient has a CIWA score of 17.  Given Ativan 2 mg IV push.  Will admit for observation  Final Clinical Impressions(s) / ED Diagnoses   Final diagnoses:  None    ED Discharge Orders    None       Lacretia Leigh, MD 08/15/18 1901

## 2018-08-15 NOTE — H&P (Signed)
Mark [name]:[id number] DOB: Apr 23, 1965 DOA: 08/15/2018     PCP: Ladell Pier, MD   Outpatient Specialists: pain medicine    Patient arrived to ER on 08/15/18 at 1539  Patient coming from: home Lives  With family  Chief Complaint:  Chief Complaint  Patient presents with  . Medical Clearance    HPI: Mark [name] is a 53 y.o. male with medical history significant of Alcohol withdrawal ([place]), Allergy, Anxiety  COPD Drug abuse, GERD, hepatitis, HLD, HTN, long QT, history of seizures due to alcohol withdrawal, tobacco abuse    Presented with  Likely alcohol withdrawal symptoms.  Patient drank about 12 pack a day last alcohol intake was 11 AM today.  For last 1 week he has been drinking about a case a day.  Has history of alcohol withdrawal seizures.  History of tobacco abuse as well as opioid abuse. Discussed tobacco abuse currently has a nicotine patch at home interested in quitting Interested in quitting alcohol that is why he presented to emergency department Has past history of significant withdrawals including intubation, significant d-dimer instruments, history of seizures, patient currently states he feels very jittery and tremulous has nausea which is typical for his alcohol withdrawal symptoms.  Denies suicidal ideations no hallucinations currently has some nausea but has not vomited yet.  Patient states he is drinking more secondary to pain.  Has been followed by pain clinic.  His medications has been adjusted by pain clinic few days ago  Infectious risk factors:  Reports none In RAPID COVID TEST NEGATIVE     Regarding pertinent Chronic problems:    Hyperlipidemia - not on statins   HTN on cozaar, toprolol   CHF diastolic/systolic/ combined - last echo 11/07/2015 EF 50% -   55%      COPD - no followed by pulmonology      While in ER:  The following Work up has been ordered so far:  Orders Placed This Encounter  Procedures  . SARS Coronavirus  2 (CEPHEID - Performed in [place] hospital lab), [facility]  . Rapid urine drug screen (hospital performed)  . Ethanol  . CBC with Differential/Platelet  . Comprehensive metabolic panel  . Lipase, blood  . Creatinine, urine, random  . Sodium, urine, random  . Osmolality, urine  . Protime-INR  . Clinical institute withdrawal assessment  . Cardiac monitoring  . Consult to hospitalist  . EKG 12-Lead  . EKG 12-Lead  . Place in observation (patient's expected length of stay will be less than 2 midnights)    Following Medications were ordered in ER: Medications  thiamine (B-1) injection 100 mg (has no administration in time range)  alum & mag hydroxide-simeth (MAALOX/MYLANTA) 200-200-20 MG/5ML suspension 30 mL (30 mLs Oral Given 08/15/18 1639)    And  lidocaine (XYLOCAINE) 2 % viscous mouth solution 15 mL (15 mLs Oral Given 08/15/18 1639)  hyoscyamine (LEVSIN SL) SL tablet 0.25 mg (0.25 mg Sublingual Given 08/15/18 1639)  LORazepam (ATIVAN) injection 2 mg (2 mg Intravenous Given 08/15/18 1846)        Consult Orders  (From admission, onward)         Start     Ordered   08/15/18 1843  Consult to hospitalist  Once    Provider:  (Not yet assigned)  Question Answer Comment  Place call to: [facility]   Reason for Consult Admit      08/15/18 1842  Significant initial  Findings: Abnormal Labs Reviewed  ETHANOL - Abnormal; Notable for the following components:      Result Value   Alcohol, Ethyl (B) 71 (*)    All other components within normal limits  CBC WITH DIFFERENTIAL/PLATELET - Abnormal; Notable for the following components:   RBC 6.54 (*)    Hemoglobin 19.3 (*)    HCT 55.4 (*)    Abs Immature Granulocytes 0.11 (*)    All other components within normal limits  COMPREHENSIVE METABOLIC PANEL - Abnormal; Notable for the following components:   Sodium 130 (*)    Chloride 94 (*)    Glucose, Bld 111 (*)    BUN <5 (*)    Creatinine, Ser 0.56 (*)    AST 99  (*)    ALT 81 (*)    All other components within normal limits     Otherwise labs showing:    Recent Labs  Lab 08/15/18 1650  NA 130*  K 3.8  CO2 22  GLUCOSE 111*  BUN <5*  CREATININE 0.56*  CALCIUM 9.0    Cr    Stable  Lab Results  Component Value Date   CREATININE 0.56 (L) 08/15/2018   CREATININE 0.65 01/03/2018   CREATININE 0.77 01/01/2018    Recent Labs  Lab 08/15/18 1650  AST 99*  ALT 81*  ALKPHOS 119  BILITOT 0.6  PROT 7.7  ALBUMIN 4.4   Lab Results  Component Value Date   CALCIUM 9.0 08/15/2018   PHOS 3.1 12/31/2017      WBC      Component Value Date/Time   WBC 8.7 08/15/2018 1650   ANC    Component Value Date/Time   NEUTROABS 5.5 08/15/2018 1650       Plt: Lab Results  Component Value Date   PLT 287 08/15/2018     Lactic Acid, Venous    Component Value Date/Time   LATICACIDVEN 1.16 02/06/2015 1130      COVID-19 Labs  No results for input(s): DDIMER, FERRITIN, LDH, CRP in the last 72 hours.  Lab Results  Component Value Date   [place] NEGATIVE 08/15/2018    HG/HCT Up from baseline see below    Component Value Date/Time   HGB 19.3 (H) 08/15/2018 1650   HGB 16.4 04/09/2017 1022   HCT 55.4 (H) 08/15/2018 1650   HCT 45.4 04/09/2017 1022    Recent Labs  Lab 08/15/18 1650  LIPASE 42      UA not ordered    ECG:  Personally reviewed by me showing: HR : 82 Rhythm: NSR,    no evidence of ischemic changes QTC 483      ED Triage Vitals  Enc Vitals Group     BP 08/15/18 1550 (!) 156/95     Pulse Rate 08/15/18 1550 (!) 31     Resp 08/15/18 1551 14     Temp 08/15/18 1551 98.1 F (36.7 C)     Temp Source 08/15/18 1551 Oral     SpO2 08/15/18 1550 97 %     Weight 08/15/18 1552 220 lb (99.8 kg)     Height 08/15/18 1552 6\' 4"  (1.93 m)     Head Circumference --      Peak Flow --      Pain Score 08/15/18 1551 9     Pain Loc --      Pain Edu? --      Excl. in [place]? --   DEYC(14)@  Latest  Blood pressure  (!) 156/95, pulse (!) 33, temperature 98.1 F (36.7 C), temperature source Oral, resp. rate 14, height 6\' 4"  (1.93 m), weight 99.8 kg, SpO2 95 %.     Hospitalist was called for admission for ETOH withdrawal   Review of Systems:    Pertinent positives include:  nausea,  Constitutional:  No weight loss, night sweats, Fevers, chills, fatigue, weight loss  HEENT:  No headaches, Difficulty swallowing,Tooth/dental problems,Sore throat,  No sneezing, itching, ear ache, nasal congestion, post nasal drip,  Cardio-vascular:  No chest pain, Orthopnea, PND, anasarca, dizziness, palpitations.no Bilateral lower extremity swelling  GI:  No heartburn, indigestion, abdominal pain, vomiting, diarrhea, change in bowel habits, loss of appetite, melena, blood in stool, hematemesis Resp:  no shortness of breath at rest. No dyspnea on exertion, No excess mucus, no productive cough, No non-productive cough, No coughing up of blood.No change in color of mucus.No wheezing. Skin:  no rash or lesions. No jaundice GU:  no dysuria, change in color of urine, no urgency or frequency. No straining to urinate.  No flank pain.  Musculoskeletal:  No joint pain or no joint swelling. No decreased range of motion. No back pain.  Psych:  No change in mood or affect. No depression or anxiety. No memory loss.  Neuro: no localizing neurological complaints, no tingling, no weakness, no double vision, no gait abnormality, no slurred speech, no confusion  All systems reviewed and apart from [place] all are negative  Past Medical History:   Past Medical History:  Diagnosis Date  . Alcohol withdrawal ([place])   . Allergy   . Anxiety Dx 2014  . COPD (chronic obstructive pulmonary disease) ([place])   . Drug abuse ([place])    pt reports opioid dependence due to previous back surgeries  . Enlarged prostate   . ETOH abuse   . GERD (gastroesophageal reflux disease) Dx 2003  . Headache(784.0)   . Hepatitis   . Hyperlipidemia Dx  2000  . Hypertension Dx 2011  . IBS (irritable bowel syndrome)    alternates constiaption/ diarrhea  . Irregular heart beat   . 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 Mental disorder   . Neuromuscular disorder ([place])    hands hurt   . Neuropathy 06/07/2013  . Seizure due to alcohol withdrawal ([place]) 01/23/2014   Per patient report  . Shortness of breath   . Withdrawal seizures ([place]) 2014   last seizure 2014 etoh with drawal      Past Surgical History:  Procedure Laterality Date  . [place] STUDY N/A 01/16/2015   Procedure: [place] STUDY;  Surgeon: Manus Gunning, MD;  Location: [facility];  Service: Gastroenterology;  Laterality: N/A;  . [place]  . COLONOSCOPY  1998  . LEFT HEART CATHETERIZATION WITH CORONARY ANGIOGRAM N/A 01/13/2014   Procedure: LEFT HEART CATHETERIZATION WITH CORONARY ANGIOGRAM;  Surgeon: Clent Demark, MD;  Location: [facility];  Service: Cardiovascular;  Laterality: N/A;  . NASAL SINUS SURGERY    . NECK SURGERY  2000   cervial fusion   . [place]ISSEN FUNDOPLICATION    . SHOULDER SURGERY Right   . SUBACROMIAL DECOMPRESSION Right 11/16/2014   Procedure: RIGHT SHOULDER ARTHROSCOPY WITH SUBACROMIAL DECOMPRESSION ;  Surgeon: Leandrew Koyanagi, MD;  Location: [facility];  Service: Orthopedics;  Laterality: Right;  . UPPER GASTROINTESTINAL ENDOSCOPY      Social History:  Ambulatory   independently  reports that he has been smoking cigarettes. He has a 15.00 pack-year smoking history. He has never used smokeless tobacco. He reports current alcohol use. He reports that he does not use drugs.     Family History:   Family History  Problem Relation Age of Onset  . Lung cancer Father        was a smoker  . Alcohol abuse Father   . Hypertension Mother   . Colon polyps Mother   . Breast cancer Paternal Grandmother   . Alcohol abuse Paternal Grandfather   . Colon cancer Neg Hx   . Rectal cancer Neg Hx   . Stomach cancer Neg  Hx     Allergies: Allergies  Allergen Reactions  . Cozaar [Losartan] Anaphylaxis  . Lisinopril Anaphylaxis     Prior to Admission medications   Medication Sig Start Date End Date Taking? Authorizing Provider  albuterol (VENTOLIN HFA) 108 (90 Base) MCG/ACT inhaler Inhale 2 puffs into the lungs every 6 (six) hours as needed for wheezing or shortness of breath. 08/12/17  Yes Ladell Pier, MD  cyclobenzaprine (FLEXERIL) 10 MG tablet Take 1 tablet (10 mg total) by mouth at bedtime. Must last 30 days Patient taking differently: Take 10 mg by mouth 3 times/day as needed-between meals & bedtime for muscle spasms.  08/20/18 11/18/18 Yes Milinda Pointer, MD  famotidine (PEPCID AC) 10 MG chewable tablet Chew 10 mg by mouth as needed for heartburn.   Yes [provider]  gabapentin (NEURONTIN) 100 MG capsule Take 2 capsules (200 mg total) by mouth 3 (three) times daily. 08/20/18 11/18/18 Yes Milinda Pointer, MD  metoprolol succinate (TOPROL-XL) 100 MG 24 hr tablet Take 50 mg by mouth 2 (two) times a day.  07/03/18  Yes [provider]  Multiple Vitamins-Minerals (CENTRUM SILVER 50+MEN PO) Take 1 tablet by mouth daily.   Yes [provider]  omeprazole (PRILOSEC OTC) 20 MG tablet Take 20 mg by mouth 2 (two) times daily as needed (heartburn).    Yes [provider]  promethazine (PHENERGAN) 12.5 MG tablet Take 1 tablet (12.5 mg total) by mouth daily as needed for nausea or vomiting. Must last 30 days 08/20/18 11/18/18 Yes Milinda Pointer, MD  traMADol (ULTRAM) 50 MG tablet Take 1-2 tablets (50-100 mg total) by mouth every 6 (six) hours as needed for severe pain. Must last 30 days 08/20/18 11/18/18 Yes Milinda Pointer, MD  traZODone (DESYREL) 100 MG tablet Take 1 tablet (100 mg total) by mouth at bedtime as needed. Patient taking differently: Take 50-100 mg by mouth at bedtime as needed for sleep.  08/21/17  Yes Ladell Pier, MD  losartan (COZAAR) 100 MG tablet  Take 100 mg by mouth daily. 07/03/18   [provider]   Physical Exam: Blood pressure (!) 156/95, pulse (!) 33, temperature 98.1 F (36.7 C), temperature source Oral, resp. rate 14, height 6\' 4"  (1.93 m), weight 99.8 kg, SpO2 95 %. 1. General:  in No Acute distress    Chronically ill -appearing 2. Psychological: Alert and  Oriented 3. Head/ENT:  Dry Mucous Membranes                          Head Non traumatic, neck supple                           Poor Dentition 4. SKIN:   decreased Skin turgor,  Skin clean Dry  and intact no rash 5. Heart: Regular rate and rhythm no Murmur, no Rub or gallop 6. Lungs  no wheezes or crackles   7. Abdomen: Soft, mild generalized tender, Non distended bowel sounds present 8. Lower extremities: no clubbing, cyanosis, no  edema 9. Neurologically Grossly intact, moving all 4 extremities equally  tremulous 10. MSK: Normal range of motion   All other LABS:     Recent Labs  Lab 08/15/18 1650  WBC 8.7  NEUTROABS 5.5  HGB 19.3*  HCT 55.4*  MCV 84.7  PLT 287     Recent Labs  Lab 08/15/18 1650  NA 130*  K 3.8  CL 94*  CO2 22  GLUCOSE 111*  BUN <5*  CREATININE 0.56*  CALCIUM 9.0     Recent Labs  Lab 08/15/18 1650  AST 99*  ALT 81*  ALKPHOS 119  BILITOT 0.6  PROT 7.7  ALBUMIN 4.4       Cultures:    Component Value Date/Time   SDES NASOPHARYNGEAL 12/20/2013 0257   SPECREQUEST NONE 12/20/2013 0257   CULT NOMRSA Performed at [facility] 12/20/2013 0257   REPTSTATUS 12/22/2013 FINAL 12/20/2013 0257     Radiological Exams on Admission: No results found.  Chart has been reviewed    Assessment/Plan  53 y.o. male with medical history significant of Alcohol withdrawal ([place]), Allergy, Anxiety  COPD Drug abuse, GERD, hepatitis, HLD, HTN, long QT, history of seizures due to alcohol withdrawal, tobacco abuse  Admitted for EtOH withdrawal  Present on Admission: . Alcohol withdrawal ([place]) -consider history of severe  withdrawal will admit to stepdown and monitor order CIWA protocol social work consult . Hyponatremia -likely secondary to dehydration we will obtain urine electrolytes and rehydrate follow sodium . Essential hypertension -chronic continue home medications . Hyperlipidemia -hold statin given elevated LFTs . Tobacco abuse -  - Spoke about importance of quitting spent 5 minutes discussing options for treatment, prior attempts at quitting, and dangers of smoking  -At this point patient is    interested in quitting  - order nicotine patch   - nursing tobacco cessation protocol  . 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 Chronic low back pain (Secondary source of pain) (Bilateral) (R>L) continue home medications currently stable . GERD (gastroesophageal reflux disease) stable . Alcohol use disorder, severe, dependence ([name])-social work consult monitor for signs of withdrawal  . Elevated LFTs -in the setting of alcohol abuse will rehydrate and repeat may need further evaluation if persists.  Obtain hepatitis serologies Poor nutritional status patient states he has not been eating much.  Will administer thiamine and check prealbumin and order nutritional consult  Other plan as per orders.  DVT prophylaxis:  SCD   Code Status:  FULL CODE  as per patient   I had personally discussed CODE STATUS with patient   Family Communication:   Family not at  Bedside    Disposition Plan:     To home once workup is complete and patient is stable                       Social Work  consulted                                  Consults called: none  Admission status:  ED Disposition    ED Disposition Condition [facility]  Area: [facility] [[id number]]  Level of Care: Stepdown [14]  Admit to SDU based on following criteria: Severe physiological/psychological symptoms:  Any diagnosis requiring assessment & intervention at least  every 4 hours on an ongoing basis to obtain desired patient outcomes including stability and rehabilitation  Covid Evaluation: N/A  Diagnosis: Alcohol withdrawal ([place]) [291.81.ICD-9-CM]  Admitting Physician: Toy Baker [[id number]]  Attending Physician: Toy Baker [[id number]]  PT Class (Do Not Modify): Observation [104]  PT Acc Code (Do Not Modify): Observation [10022]         Obs    Level of care      SDU tele indefinitely please discontinue once patient no longer qualifies  Precautions:  NONE  No active isolations  PPE: Used by the provider:   P100  eye Goggles,  Gloves       Sharde Gover 08/15/2018, 9:16 PM    [facility]     after 2 AM please page floor coverage PA If 7AM-7PM, please contact the day team taking care of the patient using [email or web address]

## 2018-08-15 NOTE — ED Notes (Signed)
Attempted to call the floor to give report. Will try again.

## 2018-08-15 NOTE — ED Triage Notes (Signed)
Pt states he called [facility] earlier, and they direct him here. Pt states that he developed pain and drinks for the pain. Pt states he gets pain all over his body.  Last drink 11 today. Pt states he had a 12 pack today. Pt states he has been drinking a case per day the last week.

## 2018-08-15 NOTE — Progress Notes (Signed)
ED TO INPATIENT HANDOFF REPORT  Name/Age/Gender Mark [name] 53 y.o. male  Code Status Code Status History    Date Active Date Inactive Code Status Order ID Comments User Context   01/03/2018 1528 01/05/2018 1353 Full Code [id number]  Ward, Ozella Almond, PA-C ED   12/31/2017 1854 01/02/2018 1449 Full Code [id number]  Reubin Milan, MD Inpatient   12/31/2017 1226 12/31/2017 1854 Full Code [id number]  Frederica Kuster, PA-C ED   05/16/2016 0131 05/18/2016 1358 Full Code [id number]  Palumbo, April, MD ED   11/07/2015 0634 11/10/2015 1902 Full Code [id number]  Edwin Dada, MD Inpatient   02/05/2015 2309 02/07/2015 1631 Full Code [id number]  Nathaniel Man, MD ED   02/03/2014 1533 02/07/2014 1807 Full Code [id number]  Kerrie Buffalo May, NP Inpatient   02/03/2014 1110 02/03/2014 1533 Full Code [id number]  Elwyn Lade, PA-C ED   02/02/2014 1354 02/02/2014 1845 Full Code [id number]  Kennedy Bucker, NP Inpatient   02/02/2014 1059 02/02/2014 1354 Full Code [id number]  Noland Fordyce, PA-C ED   01/22/2014 1841 01/28/2014 1801 Full Code [id number]  Charlcie Cradle, MD Inpatient   01/22/2014 0554 01/22/2014 1841 Full Code [id number]  Garald Balding, NP ED   01/13/2014 0938 01/13/2014 1727 Full Code [id number]  Charolette Forward, MD Inpatient   12/19/2013 2035 12/21/2013 2128 Full Code [id number]  Dixie Dials, MD Inpatient   12/19/2013 1626 12/19/2013 2035 Full Code [id number]  Johnna Acosta, MD ED   06/08/2013 0044 06/08/2013 2123 Full Code [id number]  Carmin Muskrat, MD ED   01/20/2013 2024 01/21/2013 0509 Full Code [id number]  Evelina Bucy, MD ED   06/09/2012 1222 06/25/2012 1743 Full Code [id number]  Monika Salk, MD ED   03/07/2012 1833 03/12/2012 1753 Full Code [id number]  Jari Pigg, RN Inpatient   01/20/2012 0142 01/24/2012 1526 Full Code [id number]  Dormon, Peace D, RN Inpatient   12/09/2011 2134 12/11/2011 2249 Full Code [id number]  Alvina Chou, PA-C ED   11/18/2011 1155 11/20/2011 0236  Full Code [id number]  Janice Norrie, MD ED   11/15/2011 1009 11/17/2011 0425 Full Code [id number]  Alvina Chou, PA-C ED   10/15/2011 1627 10/17/2011 1328 Full Code [id number]  Domenic Moras, PA-C ED   09/28/2011 1948 09/30/2011 1028 Full Code [id number]  Shaune Pollack, MD ED      Home/SNF/Other Home  Chief Complaint ETOH / Medical Clearance   Level of Care/Admitting Diagnosis ED Disposition    ED Disposition Condition [facility] Area: [place] [[id number]]  Level of Care: Stepdown [14]  Admit to SDU based on following criteria: Severe physiological/psychological symptoms:  Any diagnosis requiring assessment & intervention at least every 4 hours on an ongoing basis to obtain desired patient outcomes including stability and rehabilitation  Covid Evaluation: N/A  Diagnosis: Alcohol withdrawal ([place]) [291.81.ICD-9-CM]  Admitting Physician: Toy Baker [[id number]]  Attending Physician: Toy Baker [[id number]]  PT Class (Do Not Modify): Observation [104]  PT Acc Code (Do Not Modify): Observation [10022]       Medical History Past Medical History:  Diagnosis Date  . Alcohol withdrawal ([place])   . Allergy   . Anxiety Dx 2014  . COPD (chronic obstructive pulmonary disease) ([place])   . Drug abuse ([place])    pt reports opioid dependence due to previous back surgeries  . Enlarged prostate   . ETOH abuse   . GERD (gastroesophageal reflux disease) Dx 2003  . Headache(784.0)   . Hepatitis   .  Hyperlipidemia Dx 2000  . Hypertension Dx 2011  . IBS (irritable bowel syndrome)    alternates constiaption/ diarrhea  . Irregular heart beat   . 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 Mental disorder   . Neuromuscular disorder ([place])    hands hurt   . Neuropathy 06/07/2013  . Seizure due to alcohol withdrawal ([place]) 01/23/2014   Per patient report  . Shortness of breath   . Withdrawal seizures ([place]) 2014   last seizure 2014 etoh with drawal    Allergies Allergies   Allergen Reactions  . Cozaar [Losartan] Anaphylaxis  . Lisinopril Anaphylaxis    IV Location/Drains/Wounds Patient Lines/Drains/Airways Status   Active Line/Drains/Airways    Name:   Placement date:   Placement time:   Site:   Days:   Peripheral IV 12/31/17 Right Antecubital   12/31/17    1122    Antecubital   227   Peripheral IV 08/15/18 Right Hand   08/15/18    1650    Hand   less than 1   Incision (Closed) 11/16/14 Shoulder Right   11/16/14    1104     1368          Labs/Imaging Results for orders placed or performed during the hospital encounter of 08/15/18 (from the past 48 hour(s))  Rapid urine drug screen (hospital performed)     Status: None   Collection Time: 08/15/18  4:04 PM  Result Value Ref Range   Opiates NONE DETECTED NONE DETECTED   Cocaine NONE DETECTED NONE DETECTED   Benzodiazepines NONE DETECTED NONE DETECTED   Amphetamines NONE DETECTED NONE DETECTED   Tetrahydrocannabinol NONE DETECTED NONE DETECTED   Barbiturates NONE DETECTED NONE DETECTED    Comment: (NOTE) DRUG SCREEN FOR MEDICAL PURPOSES ONLY.  IF CONFIRMATION IS NEEDED FOR ANY PURPOSE, NOTIFY LAB WITHIN 5 DAYS. LOWEST DETECTABLE LIMITS FOR URINE DRUG SCREEN Drug Class                     Cutoff (ng/mL) Amphetamine and metabolites    1000 Barbiturate and metabolites    200 Benzodiazepine                 416 Tricyclics and metabolites     300 Opiates and metabolites        300 Cocaine and metabolites        300 THC                            50 Performed at [name], [place] [address], [place], [place] [address]   Creatinine, urine, random     Status: None   Collection Time: 08/15/18  4:04 PM  Result Value Ref Range   Creatinine, Urine 22.92 mg/dL    Comment: Performed at [facility], [place] [address], [place], [place] [address]  Sodium, urine, random     Status: None   Collection Time: 08/15/18  4:04 PM  Result Value Ref Range   Sodium, Ur 15  mmol/L    Comment: Performed at [facility], [place] [address] [place], [place] [address]  Ethanol     Status: Abnormal   Collection Time: 08/15/18  4:50 PM  Result Value Ref Range   Alcohol, Ethyl (B) 71 (H) <10 mg/dL    Comment: (NOTE) Lowest detectable limit for serum alcohol is 10 mg/dL. For medical purposes only. Performed at [facility], [address] [place], [place] [address]   CBC with Differential/Platelet     Status: Abnormal   Collection Time: 08/15/18  4:50 PM  Result Value Ref Range   WBC 8.7 4.0 - 10.5 K/uL   RBC 6.54 (H) 4.22 - 5.81 MIL/uL   Hemoglobin 19.3 (H) 13.0 - 17.0 g/dL   HCT 55.4 (H) 39.0 - 52.0 %   MCV 84.7 80.0 - 100.0 fL   MCH 29.5 26.0 - 34.0 pg   MCHC 34.8 30.0 - 36.0 g/dL   RDW 13.3 11.5 - 15.5 %   Platelets 287 150 - 400 K/uL   nRBC 0.0 0.0 - 0.2 %   Neutrophils Relative % 64 %   Neutro Abs 5.5 1.7 - 7.7 K/uL   Lymphocytes Relative 25 %   Lymphs Abs 2.2 0.7 - 4.0 K/uL   Monocytes Relative 8 %   Monocytes Absolute 0.7 0.1 - 1.0 K/uL   Eosinophils Relative 1 %   Eosinophils Absolute 0.1 0.0 - 0.5 K/uL   Basophils Relative 1 %   Basophils Absolute 0.1 0.0 - 0.1 K/uL   Immature Granulocytes 1 %   Abs Immature Granulocytes 0.11 (H) 0.00 - 0.07 K/uL    Comment: Performed at [facility], [place] [address] [place], [place] [address]  Comprehensive metabolic panel     Status: Abnormal   Collection Time: 08/15/18  4:50 PM  Result Value Ref Range   Sodium 130 (L) 135 - 145 mmol/L   Potassium 3.8 3.5 - 5.1 mmol/L   Chloride 94 (L) 98 - 111 mmol/L   CO2 22 22 - 32 mmol/L   Glucose, Bld 111 (H) 70 - 99 mg/dL   BUN <5 (L) 6 - 20 mg/dL   Creatinine, Ser 0.56 (L) 0.61 - 1.24 mg/dL   Calcium 9.0 8.9 - 10.3 mg/dL   Total Protein 7.7 6.5 - 8.1 g/dL   Albumin 4.4 3.5 - 5.0 g/dL   AST 99 (H) 15 - 41 U/L   ALT 81 (H) 0 - 44 U/L   Alkaline Phosphatase 119 38 - 126 U/L   Total Bilirubin 0.6 0.3 -  1.2 mg/dL   GFR calc non Af Amer >60 >60 mL/min   GFR calc Af Amer >60 >60 mL/min   Anion gap 14 5 - 15    Comment: Performed at [facility], [place] [address] [place], [place] [address]  Lipase, blood     Status: None   Collection Time: 08/15/18  4:50 PM  Result Value Ref Range   Lipase 42 11 - 51 U/L    Comment: Performed at [facility], [place] [address], [place], [place] [address]  Protime-INR     Status: None   Collection Time: 08/15/18  4:50 PM  Result Value Ref Range   Prothrombin Time 12.3 11.4 - 15.2 seconds   INR 0.9 0.8 - 1.2    Comment: (NOTE) INR goal varies based on device and disease states. Performed at [facility], [place] [address] [place], [place] [address]   SARS Coronavirus 2 (CEPHEID - Performed in [place] hospital lab), Hosp Order     Status: None   Collection Time: 08/15/18  7:06 PM  Result Value Ref Range   SARS Coronavirus 2 NEGATIVE NEGATIVE    Comment: (NOTE) If result is NEGATIVE SARS-CoV-2 target nucleic acids are NOT DETECTED. The SARS-CoV-2 RNA is generally detectable in upper and lower  respiratory specimens during the acute phase  of infection. The lowest  concentration of SARS-CoV-2 viral copies this assay can detect is 250  copies / mL. A negative result does not preclude SARS-CoV-2 infection  and should not be used as the sole basis for treatment or other  patient management decisions.  A negative result may occur with  improper specimen collection / handling, submission of specimen other  than nasopharyngeal swab, presence of viral mutation(s) within the  areas targeted by this assay, and inadequate number of viral copies  (<250 copies / mL). A negative result must be combined with clinical  observations, patient history, and epidemiological information. If result is POSITIVE SARS-CoV-2 target nucleic acids are DETECTED. The SARS-CoV-2 RNA is generally detectable in upper and lower   respiratory specimens dur ing the acute phase of infection.  Positive  results are indicative of active infection with SARS-CoV-2.  Clinical  correlation with patient history and other diagnostic information is  necessary to determine patient infection status.  Positive results do  not rule out bacterial infection or co-infection with other viruses. If result is PRESUMPTIVE POSTIVE SARS-CoV-2 nucleic acids MAY BE PRESENT.   A presumptive positive result was obtained on the submitted specimen  and confirmed on repeat testing.  While 2019 novel coronavirus  (SARS-CoV-2) nucleic acids may be present in the submitted sample  additional confirmatory testing may be necessary for epidemiological  and / or clinical management purposes  to differentiate between  SARS-CoV-2 and other Sarbecovirus currently known to infect humans.  If clinically indicated additional testing with an alternate test  methodology [phone]) is advised. The SARS-CoV-2 RNA is generally  detectable in upper and lower respiratory sp ecimens during the acute  phase of infection. The expected result is Negative. Fact Sheet for Patients:  StrictlyIdeas.no Fact Sheet for Healthcare Providers: BankingDealers.co.za This test is not yet approved or cleared by the [place] FDA and has been authorized for detection and/or diagnosis of SARS-CoV-2 by FDA under an Emergency Use Authorization (EUA).  This EUA will remain in effect (meaning this test can be used) for the duration of the COVID-19 declaration under Section 564(b)(1) of the Act, 21 U.S.C. section 360bbb-3(b)(1), unless the authorization is terminated or revoked sooner. Performed at [facility], [place] [address] [place], [place] [address]    No results found.  Pending Labs Unresulted Labs (From admission, onward)    Start     Ordered   08/15/18 1924  Osmolality, urine  Once,   R     08/15/18  1923   Signed and Held  Hepatitis panel, acute  Tomorrow morning,   R     Signed and Held   Signed and Held  Magnesium  Tomorrow morning,   R    Comments:  Call MD if <1.5    Signed and Held   Signed and Held  Phosphorus  Tomorrow morning,   R     Signed and Held   Signed and Held  TSH  Once,   R    Comments:  Cancel if already done within 1 month and notify MD    Signed and Held   Signed and Held  Comprehensive metabolic panel  Once,   R    Comments:  Cal MD for K<3.5 or >5.0    Signed and Held   Signed and Held  CBC  Once,   R    Comments:  Call for hg <8.0    Signed and Held   Visual merchandiser and Held  Prealbumin  Tomorrow morning,   R     Signed and Held          Vitals/Pain Today's Vitals   08/15/18 2001 08/15/18 2006 08/15/18 2030 08/15/18 2130  BP: (!) 142/119 115/76 (!) 140/109 (!) 170/109  Pulse: 91 92 95 93  Resp:      Temp:      TempSrc:      SpO2: 98% 98% 96% 97%  Weight:      Height:      PainSc:        Isolation Precautions No active isolations  Medications Medications  alum & mag hydroxide-simeth (MAALOX/MYLANTA) 200-200-20 MG/5ML suspension 30 mL (30 mLs Oral Given 08/15/18 1639)    And  lidocaine (XYLOCAINE) 2 % viscous mouth solution 15 mL (15 mLs Oral Given 08/15/18 1639)  hyoscyamine (LEVSIN SL) SL tablet 0.25 mg (0.25 mg Sublingual Given 08/15/18 1639)  LORazepam (ATIVAN) injection 2 mg (2 mg Intravenous Given 08/15/18 1846)  thiamine (B-1) injection 100 mg (100 mg Intravenous Given 08/15/18 2117)    Mobility walks

## 2018-08-15 NOTE — ED Notes (Signed)
Attempted to call report. Receiving RN will call back when able to take report.

## 2018-08-16 ENCOUNTER — Encounter (HOSPITAL_COMMUNITY): Payer: Self-pay | Admitting: *Deleted

## 2018-08-16 LAB — CBC
HCT: 52.1 % — ABNORMAL HIGH (ref 39.0–52.0)
Hemoglobin: 17.1 g/dL — ABNORMAL HIGH (ref 13.0–17.0)
MCH: 28.6 pg (ref 26.0–34.0)
MCHC: 32.8 g/dL (ref 30.0–36.0)
MCV: 87.3 fL (ref 80.0–100.0)
Platelets: 255 10*3/uL (ref 150–400)
RBC: 5.97 MIL/uL — ABNORMAL HIGH (ref 4.22–5.81)
RDW: 13.2 % (ref 11.5–15.5)
WBC: 10.9 10*3/uL — ABNORMAL HIGH (ref 4.0–10.5)
nRBC: 0 % (ref 0.0–0.2)

## 2018-08-16 LAB — COMPREHENSIVE METABOLIC PANEL
ALT: 66 U/L — ABNORMAL HIGH (ref 0–44)
AST: 67 U/L — ABNORMAL HIGH (ref 15–41)
Albumin: 3.6 g/dL (ref 3.5–5.0)
Alkaline Phosphatase: 99 U/L (ref 38–126)
Anion gap: 13 (ref 5–15)
BUN: 5 mg/dL — ABNORMAL LOW (ref 6–20)
CO2: 23 mmol/L (ref 22–32)
Calcium: 8.6 mg/dL — ABNORMAL LOW (ref 8.9–10.3)
Chloride: 97 mmol/L — ABNORMAL LOW (ref 98–111)
Creatinine, Ser: 0.76 mg/dL (ref 0.61–1.24)
GFR calc Af Amer: >60 mL/min (ref >60)
GFR calc non Af Amer: >60 mL/min (ref >60)
Glucose, Bld: 152 mg/dL — ABNORMAL HIGH (ref 70–99)
Potassium: 3.5 mmol/L (ref 3.5–5.1)
Sodium: 133 mmol/L — ABNORMAL LOW (ref 135–145)
Total Bilirubin: 0.6 mg/dL (ref 0.3–1.2)
Total Protein: 6.2 g/dL — ABNORMAL LOW (ref 6.5–8.1)

## 2018-08-16 LAB — PREALBUMIN: Prealbumin: 25.5 mg/dL (ref 18–38)

## 2018-08-16 LAB — MRSA PCR SCREENING: MRSA by PCR: NEGATIVE

## 2018-08-16 LAB — TSH: TSH: 3.978 u[IU]/mL (ref 0.350–4.500)

## 2018-08-16 LAB — PHOSPHORUS: Phosphorus: 3.5 mg/dL (ref 2.5–4.6)

## 2018-08-16 LAB — MAGNESIUM: Magnesium: 2.4 mg/dL (ref 1.7–2.4)

## 2018-08-16 MED ORDER — LOSARTAN POTASSIUM 50 MG PO TABS
100.0000 mg | ORAL_TABLET | Freq: Every day | ORAL | Status: DC
  Administered 2018-08-16 – 2018-08-17 (×2): 100 mg via ORAL
  Filled 2018-08-16 (×2): qty 2

## 2018-08-16 MED ORDER — TRAMADOL HCL 50 MG PO TABS
50.0000 mg | ORAL_TABLET | Freq: Four times a day (QID) | ORAL | Status: DC | PRN
  Administered 2018-08-16 – 2018-08-17 (×4): 50 mg via ORAL
  Filled 2018-08-16 (×4): qty 1

## 2018-08-16 MED ORDER — METOPROLOL SUCCINATE ER 25 MG PO TB24
50.0000 mg | ORAL_TABLET | Freq: Two times a day (BID) | ORAL | Status: DC
  Administered 2018-08-16 – 2018-08-17 (×3): 50 mg via ORAL
  Filled 2018-08-16 (×3): qty 2

## 2018-08-16 MED ORDER — METOPROLOL SUCCINATE ER 25 MG PO TB24: 50.0000 mg | ORAL_TABLET | Freq: Two times a day (BID) | ORAL | Status: DC

## 2018-08-16 NOTE — Progress Notes (Signed)
Made [place] NP aware of the Pt's 4 beat run of VT. EKG done and placed in chart, no orders given at this time. I will continue to monitor.

## 2018-08-16 NOTE — Progress Notes (Addendum)
PROGRESS NOTE    Mark [name]  ZHG:[id number] DOB: 30-May-1965 DOA: 08/15/2018 PCP: Ladell Pier, MD   Brief Narrative: Patient is a 53 year old male with history of chronic alcoholism, alcohol withdrawal seizures, anxiety, COPD, drug abuse, smoker, hepatitis, hypertension, PVCs who presents to the emergency department with complaints of alcohol withdrawal symptoms.  Patient was feeling very jittery and tremulous along with nausea.  Patient admitted for monitoring for alcohol withdrawal.  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  Continue CIWA protocol.  Currently hemodynamically stable.  Chronic alcoholism: Drinks about 12 bottles of 12 ounce beer daily.  Continue thiamine and folic acid.  Patient has resources for alcohol rehabilitation follow-up.  Hypertension: Continue home meds.  Mildly hypertensive this morning  Hyperlipidemia: Statin on hold given elevated LFTs.  Elevated LFTs: Secondary to chronic alcoholism.  Mild.  Continue to monitor.  Smoker: Smokes half pack a day.  Counseled for cessation.  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  Patient is uninsured.  Follows with Dr. Terrence Dupont.  GERD: On PPI  H/O Hep C? :  Patient not aware about this diagnosis.  Will check acute hepatitis panel.        DVT prophylaxis:SCD Code Status: Full Family Communication: None present at the bedside Disposition Plan: Likely home tomorrow    Consultants: None  Procedures: None  Antimicrobials:  Anti-infectives (From admission, onward)   None      Subjective:  Patient seen and examined the bedside this morning.  Mildly hypertensive otherwise hemodynamically stable.  Alert and  oriented.  Comfortable.  Denies any complaints.  Objective: Vitals:   08/16/18 0400 08/16/18 0500 08/16/18 0600 08/16/18 0800  BP: (!) 183/108 (!) 174/98 (!) 140/95   Pulse: 99 98 85   Resp: (!) 23 (!) 25 (!) 22   Temp: 97.6 F (36.4 C)   97.7 F (36.5 C)  TempSrc: Oral   Oral  SpO2: 96% 97% 95%   Weight:      Height:        Intake/Output Summary (Last 24 hours) at 08/16/2018 1033 Last data filed at 08/16/2018 0400 Gross per 24 hour  Intake -  Output 1000 ml  Net -1000 ml   Filed Weights   08/15/18 1552  Weight: 99.8 kg    Examination:  General exam: Appears calm and comfortable ,Not in distress,average built HEENT:PERRL,Oral mucosa moist, Ear/Nose normal on gross exam Respiratory system: Bilateral equal air entry, normal vesicular breath sounds, no wheezes or crackles  Cardiovascular system: S1 & S2 heard, RRR. No JVD, murmurs, rubs, gallops or clicks. No pedal edema. Gastrointestinal system: Abdomen is nondistended, soft and nontender. No organomegaly or masses felt. Normal bowel sounds heard. Central nervous system: Alert and oriented. No focal neurological deficits. Extremities: No edema, no clubbing ,no cyanosis, distal peripheral pulses palpable. Skin: No rashes, lesions or ulcers,no icterus ,no pallor MSK: Normal muscle bulk,tone ,power Psychiatry: Judgement and insight appear normal. Mood & affect appropriate.     Data Reviewed: I have personally reviewed following  labs and imaging studies  CBC: Recent Labs  Lab 08/15/18 1650 08/16/18 0304  WBC 8.7 10.9*  NEUTROABS 5.5  --   HGB 19.3* 17.1*  HCT 55.4* 52.1*  MCV 84.7 87.3  PLT 287 409   Basic Metabolic Panel: Recent Labs  Lab 08/15/18 1650 08/16/18 0304   NA 130* 133*  K 3.8 3.5  CL 94* 97*  CO2 22 23  GLUCOSE 111* 152*  BUN <5* 5*  CREATININE 0.56* 0.76  CALCIUM 9.0 8.6*  MG  --  2.4  PHOS  --  3.5   GFR: Estimated Creatinine Clearance: 132.6 mL/min (by C-G formula based on SCr of 0.76 mg/dL). Liver Function Tests: Recent Labs  Lab 08/15/18 1650 08/16/18 0304  AST 99* 67*  ALT 81* 66*  ALKPHOS 119 99  BILITOT 0.6 0.6  PROT 7.7 6.2*  ALBUMIN 4.4 3.6   Recent Labs  Lab 08/15/18 1650  LIPASE 42   No results for input(s): AMMONIA in the last 168 hours. Coagulation Profile: Recent Labs  Lab 08/15/18 1650  INR 0.9   Cardiac Enzymes: No results for input(s): CKTOTAL, CKMB, CKMBINDEX, TROPONINI in the last 168 hours. BNP (last 3 results) No results for input(s): PROBNP in the last 8760 hours. HbA1C: No results for input(s): HGBA1C in the last 72 hours. CBG: No results for input(s): GLUCAP in the last 168 hours. Lipid Profile: No results for input(s): CHOL, HDL, LDLCALC, TRIG, CHOLHDL, LDLDIRECT in the last 72 hours. Thyroid Function Tests: Recent Labs    08/16/18 0304  TSH 3.978   Anemia Panel: No results for input(s): VITAMINB12, FOLATE, FERRITIN, TIBC, IRON, RETICCTPCT in the last 72 hours. Sepsis Labs: No results for input(s): PROCALCITON, LATICACIDVEN in the last 168 hours.  Recent Results (from the past 240 hour(s))  SARS Coronavirus 2 (CEPHEID - Performed in [place] hospital lab), Hosp Order     Status: None   Collection Time: 08/15/18  7:06 PM  Result Value Ref Range Status   SARS Coronavirus 2 NEGATIVE NEGATIVE Final    Comment: (NOTE) If result is NEGATIVE SARS-CoV-2 target nucleic acids are NOT DETECTED. The SARS-CoV-2 RNA is generally detectable in upper and lower  respiratory specimens during the acute phase of infection. The lowest  concentration of SARS-CoV-2 viral copies this assay can detect is 250  copies / mL. A negative result does not preclude SARS-CoV-2 infection  and should not  be used as the sole basis for treatment or other  patient management decisions.  A negative result may occur with  improper specimen collection / handling, submission of specimen other  than nasopharyngeal swab, presence of viral mutation(s) within the  areas targeted by this assay, and inadequate number of viral copies  (<250 copies / mL). A negative result must be combined with clinical  observations, patient history, and epidemiological information. If result is POSITIVE SARS-CoV-2 target nucleic acids are DETECTED. The SARS-CoV-2 RNA is generally detectable in upper and lower  respiratory specimens dur ing the acute phase of infection.  Positive  results are indicative of active infection with SARS-CoV-2.  Clinical  correlation with patient history and other diagnostic information is  necessary to determine patient infection status.  Positive results do  not rule out bacterial infection or co-infection with other viruses. If result is PRESUMPTIVE POSTIVE SARS-CoV-2 nucleic acids MAY BE PRESENT.   A presumptive positive result was obtained on the submitted specimen  and confirmed on repeat testing.  While 2019 novel coronavirus  (SARS-CoV-2) nucleic  acids may be present in the submitted sample  additional confirmatory testing may be necessary for epidemiological  and / or clinical management purposes  to differentiate between  SARS-CoV-2 and other Sarbecovirus currently known to infect humans.  If clinically indicated additional testing with an alternate test  methodology [phone]) is advised. The SARS-CoV-2 RNA is generally  detectable in upper and lower respiratory sp ecimens during the acute  phase of infection. The expected result is Negative. Fact Sheet for Patients:  StrictlyIdeas.no Fact Sheet for Healthcare Providers: BankingDealers.co.za This test is not yet approved or cleared by the [place] FDA and has been authorized  for detection and/or diagnosis of SARS-CoV-2 by FDA under an Emergency Use Authorization (EUA).  This EUA will remain in effect (meaning this test can be used) for the duration of the COVID-19 declaration under Section 564(b)(1) of the Act, 21 U.S.C. section 360bbb-3(b)(1), unless the authorization is terminated or revoked sooner. Performed at [facility], [place] [address], [place], [place] [address]   MRSA PCR Screening     Status: None   Collection Time: 08/16/18 12:44 AM  Result Value Ref Range Status   MRSA by PCR NEGATIVE NEGATIVE Final    Comment:        The GeneXpert MRSA Assay (FDA approved for NASAL specimens only), is one component of a comprehensive MRSA colonization surveillance program. It is not intended to diagnose MRSA infection nor to guide or monitor treatment for MRSA infections. Performed at [facility], [place] [address], [place], [place] [address]          Radiology Studies: No results found.      Scheduled Meds: . folic acid  1 mg Oral Daily  . [START ON 08/20/2018] gabapentin  200 mg Oral TID  . guaiFENesin  600 mg Oral BID  . metoprolol succinate  50 mg Oral BID  . multivitamin with minerals  1 tablet Oral Daily  . nicotine  21 mg Transdermal Daily  . thiamine  100 mg Oral Daily   Continuous Infusions: . sodium chloride 100 mL/hr at 08/16/18 0001     LOS: 0 days    Time spent: 35 mins.More than 50% of that time was spent in counseling and/or coordination of care.      Shelly Coss, MD [facility] Pager [phone]  If 7PM-7AM, please contact night-coverage [email or web address] Password [facility] 08/16/2018, 10:33 AM

## 2018-08-17 LAB — HEPATITIS PANEL, ACUTE
HCV Ab: <0.1 s/co ratio (ref 0.0–0.9)
Hep A IgM: NEGATIVE
Hep B C IgM: NEGATIVE
Hepatitis B Surface Ag: NEGATIVE

## 2018-08-17 MED ORDER — CHLORHEXIDINE GLUCONATE CLOTH 2 % EX PADS
6.0000 | MEDICATED_PAD | Freq: Every day | CUTANEOUS | Status: DC
  Administered 2018-08-17: 6 via TOPICAL

## 2018-08-17 MED ORDER — AMLODIPINE BESYLATE 10 MG PO TABS: 10.0000 mg | ORAL_TABLET | Freq: Every day | ORAL | 0 refills | Status: DC

## 2018-08-17 MED ORDER — THIAMINE HCL 100 MG PO TABS: 100.0000 mg | ORAL_TABLET | Freq: Every day | ORAL | 0 refills | Status: DC

## 2018-08-17 MED ORDER — FOLIC ACID 1 MG PO TABS: 1.0000 mg | ORAL_TABLET | Freq: Every day | ORAL | 0 refills | Status: DC

## 2018-08-17 NOTE — Progress Notes (Signed)
CM attempted to see patient for substance abuse counseling, however patient had discharge prior to CM arrival. Bedside RN reports patient stated he has a pcp that sees him free of charge and can afford his medications. Per RN patient has also had previous admissions at [facility].

## 2018-08-17 NOTE — Discharge Summary (Signed)
Physician Discharge Summary  Mark [name] VVO:[id number] DOB: [birth year] DOA: 08/15/2018  PCP: Ladell Pier, MD  Admit date: 08/15/2018 Discharge date: 08/17/2018  Admitted From: Home Disposition:  Home  Discharge Condition:Stable CODE STATUS:FULL Diet recommendation: Heart Healthy   Brief/Interim Summary: Patient is a 53 year old male with history of chronic alcoholism, alcohol withdrawal seizures, anxiety, COPD, drug abuse, smoker, hepatitis, hypertension, PVCs who presents to the emergency department with complaints of alcohol withdrawal symptoms.  Patient was feeling very jittery and tremulous along with nausea.  Patient admitted for monitoring for alcohol withdrawal. Hospital course remained stable.He remained hypotensive.  Blood pressure medications adjusted.  Currently he is not in any withdrawal.  He stable for discharge home today.  Counseled for alcohol cessation.  Following problems were addressed during his hospitalization:  Alcohol withdrawal: Has history of severe alcohol withdrawal with alcohol withdrawal seizures.  Started on CIWA protocol.  Currently hemodynamically stable.  Not in withdrawal.  Chronic alcoholism: Drinks about 12 bottles of 12 ounce beer daily.  Continue thiamine and folic acid.  Patient has resources for alcohol rehabilitation follow-up.  Hypertension: Continue home meds.  Mildly hypertensive this morning.Added amlodipine.  Hyperlipidemia: Statin was held given elevated LFTs.Very mild.Ok to resume.  Elevated LFTs: Secondary to chronic alcoholism.  Mild.  Continue to monitor as an outpatient.  Smoker: Smokes half pack a day.  Counseled for cessation.  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  Patient is uninsured.  Follows with Dr. Terrence Dupont.Already on metoprolol.  GERD: On PPI  H/O Hep C? :  Patient not aware about this diagnosis.    Hepatitis panel pending.Follow up with PCP.   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  Follow-up with alcohol rehabilitation services. 2)Take your blood pressure medications.  Monitor blood pressure at home 3)Take prescribed medications as instructed.   Increase activity slowly   Complete by:  As directed      Allergies as of 08/17/2018      Reactions   Cozaar [losartan] Anaphylaxis   Lisinopril Anaphylaxis      Medication List    TAKE these medications   albuterol 108 (90 Base) MCG/ACT inhaler Commonly known as:  Ventolin HFA Inhale 2 puffs into the lungs every 6 (six) hours as needed for wheezing or shortness of breath.   amLODipine 10 MG tablet Commonly known as:  NORVASC Take 1 tablet (10 mg total) by mouth daily for 30 days.   CENTRUM SILVER 50+MEN PO Take 1 tablet by mouth daily.   cyclobenzaprine 10 MG tablet Commonly known as:  FLEXERIL Take 1 tablet (10 mg total) by mouth at bedtime. Must last 30 days Start taking on:  August 20, 2018 What changed:    when to take this  reasons to take this  additional instructions   famotidine 10 MG chewable tablet Commonly known as:  PEPCID  AC Chew 10 mg by mouth as needed for heartburn.   folic acid 1 MG tablet Commonly known as:  FOLVITE Take 1 tablet (1 mg total) by mouth daily. Start taking on:  August 18, 2018   gabapentin 100 MG capsule Commonly known as:  Neurontin Take 2  capsules (200 mg total) by mouth 3 (three) times daily. Start taking on:  August 20, 2018   losartan 100 MG tablet Commonly known as:  COZAAR Take 100 mg by mouth daily.   metoprolol succinate 100 MG 24 hr tablet Commonly known as:  TOPROL-XL Take 50 mg by mouth 2 (two) times a day.   PriLOSEC OTC 20 MG tablet Generic drug:  omeprazole Take 20 mg by mouth 2 (two) times daily as needed (heartburn).   promethazine 12.5 MG tablet Commonly known as:  PHENERGAN Take 1 tablet (12.5 mg total) by mouth daily as needed for nausea or vomiting. Must last 30 days Start taking on:  August 20, 2018   thiamine 100 MG tablet Take 1 tablet (100 mg total) by mouth daily. Start taking on:  August 18, 2018   traMADol 50 MG tablet Commonly known as:  ULTRAM Take 1-2 tablets (50-100 mg total) by mouth every 6 (six) hours as needed for severe pain. Must last 30 days Start taking on:  August 20, 2018   traZODone 100 MG tablet Commonly known as:  DESYREL Take 1 tablet (100 mg total) by mouth at bedtime as needed. What changed:    how much to take  reasons to take this      Follow-up Information    Ladell Pier, MD. Schedule an appointment as soon as possible for a visit in 1 week(s).   Specialty:  Internal Medicine Contact information: [address] [place] [address] [phone]          Allergies  Allergen Reactions  . Cozaar [Losartan] Anaphylaxis  . Lisinopril Anaphylaxis    Consultations:  None   Procedures/Studies:  No results found.    Subjective: Patient seen and examined the bedside this morning.  Remains mildly hypertensive otherwise hemodynamically stable.  Denies any complaints.  Not in withdrawal.  Stable for discharge.  Discharge Exam: Vitals:   08/17/18 0748 08/17/18 0800  BP: (!) 175/105   Pulse: (!) 56   Resp: 16   Temp:  97.8 F (36.6 C)  SpO2: 100%    Vitals:   08/17/18 0400 08/17/18 0700 08/17/18 0748 08/17/18 0800  BP:   (!) 175/105    Pulse:  (!) 29 (!) 56   Resp:   16   Temp: 97.7 F (36.5 C)   97.8 F (36.6 C)  TempSrc: Oral   Oral  SpO2:  (!) 88% 100%   Weight:      Height:        General: Pt is alert, awake, not in acute distress Cardiovascular: RRR, S1/S2 +, no rubs, no gallops Respiratory: CTA bilaterally, no wheezing, no rhonchi Abdominal: Soft, NT, ND, bowel sounds + Extremities: no edema, no cyanosis    The results of significant diagnostics from this hospitalization (including imaging, microbiology, ancillary and laboratory) are listed below for reference.     Microbiology: Recent Results (from the past 240 hour(s))  SARS Coronavirus 2 (CEPHEID - Performed in [place] hospital lab), Hosp Order     Status: None   Collection Time: 08/15/18  7:06 PM  Result Value Ref Range Status   SARS Coronavirus 2 NEGATIVE NEGATIVE Final  Comment: (NOTE) If result is NEGATIVE SARS-CoV-2 target nucleic acids are NOT DETECTED. The SARS-CoV-2 RNA is generally detectable in upper and lower  respiratory specimens during the acute phase of infection. The lowest  concentration of SARS-CoV-2 viral copies this assay can detect is 250  copies / mL. A negative result does not preclude SARS-CoV-2 infection  and should not be used as the sole basis for treatment or other  patient management decisions.  A negative result may occur with  improper specimen collection / handling, submission of specimen other  than nasopharyngeal swab, presence of viral mutation(s) within the  areas targeted by this assay, and inadequate number of viral copies  (<250 copies / mL). A negative result must be combined with clinical  observations, patient history, and epidemiological information. If result is POSITIVE SARS-CoV-2 target nucleic acids are DETECTED. The SARS-CoV-2 RNA is generally detectable in upper and lower  respiratory specimens dur ing the acute phase of infection.  Positive  results are indicative of active infection  with SARS-CoV-2.  Clinical  correlation with patient history and other diagnostic information is  necessary to determine patient infection status.  Positive results do  not rule out bacterial infection or co-infection with other viruses. If result is PRESUMPTIVE POSTIVE SARS-CoV-2 nucleic acids MAY BE PRESENT.   A presumptive positive result was obtained on the submitted specimen  and confirmed on repeat testing.  While 2019 novel coronavirus  (SARS-CoV-2) nucleic acids may be present in the submitted sample  additional confirmatory testing may be necessary for epidemiological  and / or clinical management purposes  to differentiate between  SARS-CoV-2 and other Sarbecovirus currently known to infect humans.  If clinically indicated additional testing with an alternate test  methodology [phone]) is advised. The SARS-CoV-2 RNA is generally  detectable in upper and lower respiratory sp ecimens during the acute  phase of infection. The expected result is Negative. Fact Sheet for Patients:  StrictlyIdeas.no Fact Sheet for Healthcare Providers: BankingDealers.co.za This test is not yet approved or cleared by the [place] FDA and has been authorized for detection and/or diagnosis of SARS-CoV-2 by FDA under an Emergency Use Authorization (EUA).  This EUA will remain in effect (meaning this test can be used) for the duration of the COVID-19 declaration under Section 564(b)(1) of the Act, 21 U.S.C. section 360bbb-3(b)(1), unless the authorization is terminated or revoked sooner. Performed at [facility], [place] [address], [place], [place] [address]   MRSA PCR Screening     Status: None   Collection Time: 08/16/18 12:44 AM  Result Value Ref Range Status   MRSA by PCR NEGATIVE NEGATIVE Final    Comment:        The GeneXpert MRSA Assay (FDA approved for NASAL specimens only), is one component of a comprehensive MRSA  colonization surveillance program. It is not intended to diagnose MRSA infection nor to guide or monitor treatment for MRSA infections. Performed at [facility], [place] [address], [place], [place] [address]      Labs: BNP (last 3 results) No results for input(s): BNP in the last 8760 hours. Basic Metabolic Panel: Recent Labs  Lab 08/15/18 1650 08/16/18 0304  NA 130* 133*  K 3.8 3.5  CL 94* 97*  CO2 22 23  GLUCOSE 111* 152*  BUN <5* 5*  CREATININE 0.56* 0.76  CALCIUM 9.0 8.6*  MG  --  2.4  PHOS  --  3.5   Liver Function Tests: Recent Labs  Lab 08/15/18  1650 08/16/18 0304  AST 99* 67*  ALT 81* 66*  ALKPHOS 119 99  BILITOT 0.6 0.6  PROT 7.7 6.2*  ALBUMIN 4.4 3.6   Recent Labs  Lab 08/15/18 1650  LIPASE 42   No results for input(s): AMMONIA in the last 168 hours. CBC: Recent Labs  Lab 08/15/18 1650 08/16/18 0304  WBC 8.7 10.9*  NEUTROABS 5.5  --   HGB 19.3* 17.1*  HCT 55.4* 52.1*  MCV 84.7 87.3  PLT 287 255   Cardiac Enzymes: No results for input(s): CKTOTAL, CKMB, CKMBINDEX, TROPONINI in the last 168 hours. BNP: Invalid input(s): POCBNP CBG: No results for input(s): GLUCAP in the last 168 hours. D-Dimer No results for input(s): DDIMER in the last 72 hours. Hgb A1c No results for input(s): HGBA1C in the last 72 hours. Lipid Profile No results for input(s): CHOL, HDL, LDLCALC, TRIG, CHOLHDL, LDLDIRECT in the last 72 hours. Thyroid function studies Recent Labs    08/16/18 0304  TSH 3.978   Anemia work up No results for input(s): VITAMINB12, FOLATE, FERRITIN, TIBC, IRON, RETICCTPCT in the last 72 hours. Urinalysis    Component Value Date/Time   COLORURINE STRAW (A) 05/15/2016 2123   APPEARANCEUR Clear 04/09/2017 1022   LABSPEC 1.005 05/15/2016 2123   PHURINE 6.0 05/15/2016 2123   GLUCOSEU Negative 04/09/2017 1022   HGBUR NEGATIVE 05/15/2016 2123   BILIRUBINUR Negative 04/09/2017 1022   KETONESUR NEGATIVE 05/15/2016  2123   PROTEINUR Negative 04/09/2017 1022   PROTEINUR NEGATIVE 05/15/2016 2123   UROBILINOGEN 0.2 09/16/2014 1708   UROBILINOGEN 1.0 01/20/2013 1900   NITRITE Negative 04/09/2017 1022   NITRITE NEGATIVE 05/15/2016 2123   LEUKOCYTESUR Negative 04/09/2017 1022   Sepsis Labs Invalid input(s): PROCALCITONIN,  WBC,  LACTICIDVEN Microbiology Recent Results (from the past 240 hour(s))  SARS Coronavirus 2 (CEPHEID - Performed in [place] hospital lab), Hosp Order     Status: None   Collection Time: 08/15/18  7:06 PM  Result Value Ref Range Status   SARS Coronavirus 2 NEGATIVE NEGATIVE Final    Comment: (NOTE) If result is NEGATIVE SARS-CoV-2 target nucleic acids are NOT DETECTED. The SARS-CoV-2 RNA is generally detectable in upper and lower  respiratory specimens during the acute phase of infection. The lowest  concentration of SARS-CoV-2 viral copies this assay can detect is 250  copies / mL. A negative result does not preclude SARS-CoV-2 infection  and should not be used as the sole basis for treatment or other  patient management decisions.  A negative result may occur with  improper specimen collection / handling, submission of specimen other  than nasopharyngeal swab, presence of viral mutation(s) within the  areas targeted by this assay, and inadequate number of viral copies  (<250 copies / mL). A negative result must be combined with clinical  observations, patient history, and epidemiological information. If result is POSITIVE SARS-CoV-2 target nucleic acids are DETECTED. The SARS-CoV-2 RNA is generally detectable in upper and lower  respiratory specimens dur ing the acute phase of infection.  Positive  results are indicative of active infection with SARS-CoV-2.  Clinical  correlation with patient history and other diagnostic information is  necessary to determine patient infection status.  Positive results do  not rule out bacterial infection or co-infection with other  viruses. If result is PRESUMPTIVE POSTIVE SARS-CoV-2 nucleic acids MAY BE PRESENT.   A presumptive positive result was obtained on the submitted specimen  and confirmed on repeat testing.  While 2019 novel coronavirus  (SARS-CoV-2)  nucleic acids may be present in the submitted sample  additional confirmatory testing may be necessary for epidemiological  and / or clinical management purposes  to differentiate between  SARS-CoV-2 and other Sarbecovirus currently known to infect humans.  If clinically indicated additional testing with an alternate test  methodology [phone]) is advised. The SARS-CoV-2 RNA is generally  detectable in upper and lower respiratory sp ecimens during the acute  phase of infection. The expected result is Negative. Fact Sheet for Patients:  StrictlyIdeas.no Fact Sheet for Healthcare Providers: BankingDealers.co.za This test is not yet approved or cleared by the [place] FDA and has been authorized for detection and/or diagnosis of SARS-CoV-2 by FDA under an Emergency Use Authorization (EUA).  This EUA will remain in effect (meaning this test can be used) for the duration of the COVID-19 declaration under Section 564(b)(1) of the Act, 21 U.S.C. section 360bbb-3(b)(1), unless the authorization is terminated or revoked sooner. Performed at [facility], [place] [address] [place], [place] [address]   MRSA PCR Screening     Status: None   Collection Time: 08/16/18 12:44 AM  Result Value Ref Range Status   MRSA by PCR NEGATIVE NEGATIVE Final    Comment:        The GeneXpert MRSA Assay (FDA approved for NASAL specimens only), is one component of a comprehensive MRSA colonization surveillance program. It is not intended to diagnose MRSA infection nor to guide or monitor treatment for MRSA infections. Performed at [facility], [place] [address], [place], [place] [address]      Please note: You were cared for by a hospitalist during your hospital stay. Once you are discharged, your primary care physician will handle any further medical issues. Please note that NO REFILLS for any discharge medications will be authorized once you are discharged, as it is imperative that you return to your primary care physician (or establish a relationship with a primary care physician if you do not have one) for your post hospital discharge needs so that they can reassess your need for medications and monitor your lab values.    Time coordinating discharge: 40 minutes  SIGNED:   Shelly Coss, MD  [facility] 08/17/2018, 10:58 AM Pager [phone]  If 7PM-7AM, please contact night-coverage [email or web address] Password [email or web address]

## 2018-08-17 NOTE — Progress Notes (Signed)
Patient escorted to front entrance.  D/c home with a friend.

## 2018-11-02 ENCOUNTER — Ambulatory Visit: Payer: Self-pay | Admitting: Pain Medicine

## 2018-11-03 ENCOUNTER — Other Ambulatory Visit: Payer: Self-pay | Admitting: Pain Medicine

## 2018-11-03 DIAGNOSIS — M792 Neuralgia and neuritis, unspecified: Secondary | ICD-10-CM

## 2018-11-05 ENCOUNTER — Encounter: Payer: Self-pay | Admitting: Pain Medicine

## 2018-11-09 ENCOUNTER — Ambulatory Visit: Payer: Self-pay | Attending: Pain Medicine | Admitting: Pain Medicine

## 2018-11-09 ENCOUNTER — Other Ambulatory Visit: Payer: Self-pay

## 2018-11-09 DIAGNOSIS — M5441 Lumbago with sciatica, right side: Secondary | ICD-10-CM

## 2018-11-09 DIAGNOSIS — M5416 Radiculopathy, lumbar region: Secondary | ICD-10-CM

## 2018-11-09 DIAGNOSIS — R748 Abnormal levels of other serum enzymes: Secondary | ICD-10-CM | POA: Insufficient documentation

## 2018-11-09 DIAGNOSIS — I4581 Long QT syndrome: Secondary | ICD-10-CM

## 2018-11-09 DIAGNOSIS — I4729 Other ventricular tachycardia: Secondary | ICD-10-CM

## 2018-11-09 DIAGNOSIS — M5442 Lumbago with sciatica, left side: Secondary | ICD-10-CM

## 2018-11-09 DIAGNOSIS — G5793 Unspecified mononeuropathy of bilateral lower limbs: Secondary | ICD-10-CM

## 2018-11-09 DIAGNOSIS — M792 Neuralgia and neuritis, unspecified: Secondary | ICD-10-CM

## 2018-11-09 DIAGNOSIS — R110 Nausea: Secondary | ICD-10-CM

## 2018-11-09 DIAGNOSIS — M7918 Myalgia, other site: Secondary | ICD-10-CM

## 2018-11-09 DIAGNOSIS — G8929 Other chronic pain: Secondary | ICD-10-CM

## 2018-11-09 DIAGNOSIS — G894 Chronic pain syndrome: Secondary | ICD-10-CM

## 2018-11-09 DIAGNOSIS — I472 Ventricular tachycardia: Secondary | ICD-10-CM

## 2018-11-09 DIAGNOSIS — M62830 Muscle spasm of back: Secondary | ICD-10-CM

## 2018-11-09 MED ORDER — PROMETHAZINE HCL 12.5 MG PO TABS: 12.5000 mg | ORAL_TABLET | Freq: Every day | ORAL | 0 refills | Status: DC | PRN

## 2018-11-09 MED ORDER — BACLOFEN 10 MG PO TABS: 10.0000 mg | ORAL_TABLET | Freq: Three times a day (TID) | ORAL | 2 refills | Status: DC

## 2018-11-09 MED ORDER — MAGNESIUM OXIDE -MG SUPPLEMENT 500 MG PO CAPS: 1.0000 | ORAL_CAPSULE | Freq: Two times a day (BID) | ORAL | 2 refills | Status: DC

## 2018-11-09 MED ORDER — GABAPENTIN 100 MG PO CAPS: 200.0000 mg | ORAL_CAPSULE | Freq: Three times a day (TID) | ORAL | 2 refills | Status: DC

## 2018-11-09 MED ORDER — OXYCODONE HCL 5 MG PO TABS: 5.0000 mg | ORAL_TABLET | Freq: Every day | ORAL | 0 refills | Status: DC

## 2018-11-09 MED FILL — BACLOFEN 10 MG TABS: 10 | 30 days supply | Qty: 90 | Fill #0

## 2018-11-09 MED FILL — PROMETHAZINE 12.5 MG TABLET: 12.5 | 30 days supply | Qty: 15 | Fill #0

## 2018-11-09 MED FILL — MAGNESIUM OXIDE 500 MG TAB: 500 | 50 days supply | Qty: 100 | Fill #0

## 2018-11-09 MED FILL — GABAPENTIN 100 MG CAPSULE: 100 | 30 days supply | Qty: 180 | Fill #0

## 2018-11-09 NOTE — Progress Notes (Signed)
Pain Management Virtual Encounter Note - Virtual Visit via Telephone Telehealth (real-time audio visits between healthcare provider and patient).   Patient's Phone No. & Preferred Pharmacy:  [phone] (home); [phone] (mobile); (Preferred) [phone] No e-mail address on record  [address] [place] [address] Phone: [phone] Fax: [phone]    Pre-screening note:  Our staff contacted Mark [name] and offered him an "in person", "face-to-face" appointment versus a telephone encounter. He indicated preferring the telephone encounter, at this time.   Reason for Virtual Visit: COVID-19*  Social distancing based on CDC and AMA recommendations.   I contacted Mark [name] on 11/09/2018 (53) via telephone.      I clearly identified myself as [name], MD. I verified that I was speaking with the correct person using two identifiers (Name: Mark [name], and date of birth: [birth year] (53)).  Advanced Informed Consent I sought verbal advanced consent from Mark [name] for virtual visit interactions. I informed Mark [name] of possible security and privacy concerns, risks, and limitations associated with providing "not-in-person" medical evaluation and management services. I also informed Mark [name] of the availability of "in-person" appointments. Finally, I informed him that there would be a charge for the virtual visit and that he could be  personally, fully or partially, financially responsible for it. Mark [name] expressed understanding and agreed to proceed.   Historic Elements   Mark [name] is a 53 y.o. year old, male patient evaluated today after his last encounter by our practice on 11/03/2018. 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 He also  has a past surgical history that includes Back surgery (1995, 1999); Neck surgery (2000); Nasal sinus surgery; left heart catheterization with coronary angiogram (N/A, 01/13/2014); Subacromial decompression (Right, 11/16/2014); Nissen fundoplication; 24 hour ph study (N/A, 01/16/2015); Upper gastrointestinal endoscopy; Colonoscopy (1998); and Shoulder surgery (Right). Mark [name] has a current medication list which includes the following prescription(s): albuterol, cyclobenzaprine, famotidine, gabapentin, metoprolol succinate, multiple vitamins-minerals, omeprazole, promethazine, thiamine, trazodone, amlodipine, baclofen, magnesium oxide, oxycodone, oxycodone, and oxycodone. He  reports that he has been smoking cigarettes. He has a 15.00 pack-year smoking history. He has never used smokeless tobacco. He reports current alcohol use. He reports that he does not use drugs. Mark [name] is allergic to cozaar [losartan] and lisinopril.   HPI  Today, he is being contacted for medication management.  The patient was found to have elevated liver enzymes.  Because of this, we will discontinue the tramadol and we will substitute for oxycodone 5 to 10 mg every 8 hours.  Unfortunately, this patient is high risk for several reasons.  He has a prolonged QT interval with paroxysmal ventricular tachycardia.  He also has a history of alcoholism, nicotine abuse, bipolar disorder, and a prior history of suicidal ideation.  In reviewing his lab work, I found a test done where he had elevated ethanol levels.  Many times we  have warned this patient not to use any alcohol.  Today he was given a final warning and informed  that should he ever use any alcohol again, we will taper down and discontinue the oxycodone and any other opioid analgesics and they will not be renewed.  Today we will be discontinuing the tramadol due to the elevated liver enzymes.  Today I will also start him on magnesium oxide 500 mg p.o. twice daily to provide him with some protection against torsades, and to help him with the muscle spasms.  Pharmacotherapy Assessment  Analgesic: Tramadol 50 mg, 2 tabs PO q 6 hrs (400 mg/day of tramadol).  Today we will substitute for oxycodone IR, 1-2 tabs PO q 8 hrs (30 mg/day of oxycodone) MME/day: 40 mg/day.  Today this will be increased to 45 mg/day.   Monitoring: Pharmacotherapy: No side-effects or adverse reactions reported. [place] PMP: PDMP reviewed during this encounter.       Compliance: No problems identified. Effectiveness: Clinically acceptable. Plan: Refer to "POC".  UDS:  Summary  Date Value Ref Range Status  04/20/2018 FINAL  Final    Comment:    ==================================================================== TOXASSURE SELECT 13 (MW) ==================================================================== Test                             Result       Flag       Units Drug Present and Declared for Prescription Verification   Tramadol                       >4274        EXPECTED   ng/mg creat   O-Desmethyltramadol            >4274        EXPECTED   ng/mg creat   N-Desmethyltramadol            1461         EXPECTED   ng/mg creat    Source of tramadol is a prescription medication.    O-desmethyltramadol and N-desmethyltramadol are expected    metabolites of tramadol. ==================================================================== Test                      Result    Flag   Units      Ref Range   Creatinine              117              mg/dL       >=20 ==================================================================== Declared Medications:  The flagging and interpretation on this report are based on the  following declared medications.  Unexpected results may arise from  inaccuracies in the declared medications.  **Note: The testing scope of this panel includes these medications:  Tramadol  **Note: The testing scope of this panel does not include following  reported medications:  Albuterol  Cyclobenzaprine  Famotidine  Gabapentin  Metoprolol  Omeprazole  Promethazine  Trazodone ==================================================================== For clinical consultation, please call [phone]. ====================================================================    Laboratory Chemistry Profile (12 mo)  Renal: 08/16/2018: BUN 5; Creatinine, Ser 0.76  Lab Results  Component Value Date   GFRAA >60 08/16/2018   GFRNONAA >60 08/16/2018   Hepatic: 08/16/2018: Albumin 3.6 Lab Results  Component Value Date   AST 67 (H) 08/16/2018   ALT 66 (H) 08/16/2018   Other: No results found for requested labs within last 8760 hours. Note: Above Lab results reviewed.  Imaging  Last 90 days:  No results found.  Assessment  The primary  encounter diagnosis was Chronic pain syndrome. Diagnoses of Lower extremity neuropathy (Primary Area of Pain) (Bilateral) (R>L), Chronic low back pain (Secondary area of Pain) (Bilateral) (R>L), Chronic radicular low back pain (Third area of Pain) (Right) (to calf), Chronic musculoskeletal pain, Muscle spasm of back, Nausea in adult, Neurogenic pain, Elevated liver enzymes, Long Q-T syndrome, and Paroxysmal VT ([name]) were also pertinent to this visit.  Plan of Care  I have discontinued Mark [name]'s traMADol, losartan, and folic acid. I am also having him start on oxyCODONE, Magnesium Oxide, baclofen, oxyCODONE, and oxyCODONE. Additionally, I am having him maintain his albuterol, traZODone,  omeprazole, famotidine, cyclobenzaprine, metoprolol succinate, Multiple Vitamins-Minerals (CENTRUM SILVER 50+MEN PO), thiamine, amLODipine, promethazine, and gabapentin.  Pharmacotherapy (Medications Ordered): Meds ordered this encounter  Medications  . promethazine (PHENERGAN) 12.5 MG tablet    Sig: Take 1 tablet (12.5 mg total) by mouth daily as needed for nausea or vomiting. Must last 30 days    Dispense:  15 tablet    Refill:  0    Fill one day early if pharmacy is closed on scheduled refill date. May substitute for generic if available.  . gabapentin (NEURONTIN) 100 MG capsule    Sig: Take 2 capsules (200 mg total) by mouth 3 (three) times daily.    Dispense:  180 capsule    Refill:  2    Fill one day early if pharmacy is closed on scheduled refill date. May substitute for generic if available.  Marland Kitchen oxyCODONE (OXY IR/ROXICODONE) 5 MG immediate release tablet    Sig: Take 1 tablet (5 mg total) by mouth 5 (five) times daily. Must last 30 days.    Dispense:  150 tablet    Refill:  0    Chronic Pain: STOP Act (Not applicable) Fill 1 day early if closed on refill date. Do not fill until: 11/18/2018. To last until: 12/18/2018. Avoid benzodiazepines and alcohol within 8 hours of opioids.  . Magnesium Oxide 500 MG CAPS    Sig: Take 1 capsule (500 mg total) by mouth 2 (two) times daily at 8 am and 10 pm.    Dispense:  60 capsule    Refill:  2    Fill one day early if pharmacy is closed on scheduled refill date. May substitute for generic if available.  . baclofen (LIORESAL) 10 MG tablet    Sig: Take 1 tablet (10 mg total) by mouth 3 (three) times daily.    Dispense:  90 tablet    Refill:  2    Fill one day early if pharmacy is closed on scheduled refill date. May substitute for generic if available.  Marland Kitchen oxyCODONE (OXY IR/ROXICODONE) 5 MG immediate release tablet    Sig: Take 1 tablet (5 mg total) by mouth 5 (five) times daily. Must last 30 days.    Dispense:  150 tablet    Refill:  0     Chronic Pain: STOP Act (Not applicable) Fill 1 day early if closed on refill date. Do not fill until: 12/18/2018. To last until: 01/17/2019. Avoid benzodiazepines and alcohol within 8 hours of opioids.  Marland Kitchen oxyCODONE (OXY IR/ROXICODONE) 5 MG immediate release tablet    Sig: Take 1 tablet (5 mg total) by mouth 5 (five) times daily. Must last 30 days.    Dispense:  150 tablet    Refill:  0    Chronic Pain: STOP Act (Not applicable) Fill 1 day early if closed on refill date. Do not fill until: 01/17/2019. To  last until: 02/16/2019. Avoid benzodiazepines and alcohol within 8 hours of opioids.   Orders:  No orders of the defined types were placed in this encounter.  Follow-up plan:   Return in about 3 months (around 02/10/2019) for (VV), E/M, (MM).      Interventional management options:  Considering:   Lower extremity EMG/PNCV. (Radiculopathy versus peripheral neuropathy) Upper extremity EMG/PNCV. Possibleright-sided L3 + L4 Lumbarsympathetic RFA Possible Racz procedure Diagnostic CESI  Diagnostic bilateral cervical facet block  Possible bilateral cervical facet RFA Possible bilateral lumbar facet RFA Diagnostic right IA shoulder joint injection Diagnostic right suprascapular NB Possibleright suprascapular RFA Diagnostic bilateral IA knee joint injection Possible bilateral series of 5 Hyal[place] knee injections Diagnostic bilateral Genicular NB Possible bilateral Genicular RFA Diagnostic bilateral greater occipital NB Possible bilateral greater occipital nerve RFA Diagnostic bilateral C2+TON NB Possible bilateral C2 + TON RFA   Palliative PRN treatment(s):   Palliative right IA hip joint injection #2  Diagnostic right lumbar facet block #3  Diagnostic left lumbar facet block #2  Palliative caudal ESI + epidurogram #3  Palliative right lumbar sympathetic block #3  Palliative right lumbar sympathetic RFA #2 (last done 12/26/2016)  Diagnostic right SI joint block #2      Recent Visits No visits were found meeting these conditions.  Showing recent visits within past 90 days and meeting all other requirements   Today's Visits Date Type Provider Dept  11/09/18 Office Visit Milinda Pointer, MD [facility]  Showing today's visits and meeting all other requirements   Future Appointments No visits were found meeting these conditions.  Showing future appointments within next 90 days and meeting all other requirements   I discussed the assessment and treatment plan with the patient. The patient was provided an opportunity to ask questions and all were answered. The patient agreed with the plan and demonstrated an understanding of the instructions.  Patient advised to call back or seek an in-person evaluation if the symptoms or condition worsens.  Total duration of non-face-to-face encounter: 20 minutes.  Note by: [name], MD Date: 11/09/2018; Time: 3:26 PM  Note: This dictation was prepared with Dragon dictation. Any transcriptional errors that may result from this process are unintentional.  Disclaimer:  * Given the special circumstances of the COVID-19 pandemic, the federal government has announced that the Office for Civil Rights (OCR) will exercise its enforcement discretion and will not impose penalties on physicians using telehealth in the event of noncompliance with regulatory requirements under the [place] (HIPAA) in connection with the good faith provision of telehealth during the XX123456 national public health emergency. ([place])

## 2018-11-09 NOTE — Patient Instructions (Signed)
____________________________________________________________________________________________  Medication Rules  Purpose: To inform patients, and their family members, of our rules and regulations.  Applies to: All patients receiving prescriptions (written or electronic).  Pharmacy of record: Pharmacy where electronic prescriptions will be sent. If written prescriptions are taken to a different pharmacy, please inform the nursing staff. The pharmacy listed in the electronic medical record should be the one where you would like electronic prescriptions to be sent.  Electronic prescriptions: In compliance with the [place] [employer]reng[place] Opioid Misuse Prevention (STOP) Act of 2017 (Session Law 2017-74/H243), effective March 11, 2018, all controlled substances must be electronically prescribed. Calling prescriptions to the pharmacy will cease to exist.  Prescription refills: Only during scheduled appointments. Applies to all prescriptions.  NOTE: The following applies primarily to controlled substances (Opioid* Pain Medications).   Patient's responsibilities: 1. Pain Pills: Bring all pain pills to every appointment (except for procedure appointments). 2. Pill Bottles: Bring pills in original pharmacy bottle. Always bring the newest bottle. Bring bottle, even if empty. 3. Medication refills: You are responsible for knowing and keeping track of what medications you take and those you need refilled. The day before your appointment: write a list of all prescriptions that need to be refilled. The day of the appointment: give the list to the admitting nurse. Prescriptions will be written only during appointments. No prescriptions will be written on procedure days. If you forget a medication: it will not be "Called in", "Faxed", or "electronically sent". You will need to get another appointment to get these prescribed. No early refills. Do not call asking to have your prescription filled  early. 4. Prescription Accuracy: You are responsible for carefully inspecting your prescriptions before leaving our office. Have the discharge nurse carefully go over each prescription with you, before taking them home. Make sure that your name is accurately spelled, that your address is correct. Check the name and dose of your medication to make sure it is accurate. Check the number of pills, and the written instructions to make sure they are clear and accurate. Make sure that you are given enough medication to last until your next medication refill appointment. 5. Taking Medication: Take medication as prescribed. When it comes to controlled substances, taking less pills or less frequently than prescribed is permitted and encouraged. Never take more pills than instructed. Never take medication more frequently than prescribed.  6. Inform other Doctors: Always inform, all of your healthcare providers, of all the medications you take. 7. Pain Medication from other Providers: You are not allowed to accept any additional pain medication from any other Doctor or Healthcare provider. There are two exceptions to this rule. (see below) In the event that you require additional pain medication, you are responsible for notifying us, as stated below. 8. Medication Agreement: You are responsible for carefully reading and following our Medication Agreement. This must be signed before receiving any prescriptions from our practice. Safely store a copy of your signed Agreement. Violations to the Agreement will result in no further prescriptions. (Additional copies of our Medication Agreement are available upon request.) 9. Laws, Rules, & Regulations: All patients are expected to follow all Federal and State Laws, Statutes, Rules, & Regulations. Ignorance of the Laws does not constitute a valid excuse. The use of any illegal substances is prohibited. 10. Adopted CDC guidelines & recommendations: Target dosing levels will be  at or below 60 MME/day. Use of benzodiazepines** is not recommended.  Exceptions: There are only two exceptions to the rule of not   receiving pain medications from other Healthcare Providers. 1. Exception #1 (Emergencies): In the event of an emergency (i.e.: accident requiring emergency care), you are allowed to receive additional pain medication. However, you are responsible for: As soon as you are able, call our office [phone], at any time of the day or night, and leave a message stating your name, the date and nature of the emergency, and the name and dose of the medication prescribed. In the event that your call is answered by a member of our staff, make sure to document and save the date, time, and the name of the person that took your information.  2. Exception #2 (Planned Surgery): In the event that you are scheduled by another doctor or dentist to have any type of surgery or procedure, you are allowed (for a period no longer than 30 days), to receive additional pain medication, for the acute post-op pain. However, in this case, you are responsible for picking up a copy of our "Post-op Pain Management for Surgeons" handout, and giving it to your surgeon or dentist. This document is available at our office, and does not require an appointment to obtain it. Simply go to our office during business hours (Monday-Thursday from 8:00 AM to 4:00 PM) (Friday 8:00 AM to 12:00 Noon) or if you have a scheduled appointment with us, prior to your surgery, and ask for it by name. In addition, you will need to provide us with your name, name of your surgeon, type of surgery, and date of procedure or surgery.  *Opioid medications include: morphine, codeine, oxycodone, oxymorphone, hydrocodone, hydromorphone, meperidine, tramadol, tapentadol, buprenorphine, fentanyl, methadone. **Benzodiazepine medications include: diazepam (Valium), alprazolam (Xanax), clonazepam (Klonopine), lorazepam (Ativan), clorazepate  (Tranxene), chlordiazepoxide (Librium), estazolam (Prosom), oxazepam (Serax), temazepam (Restoril), triazolam (Halcion) (Last updated: 05/08/2017) ____________________________________________________________________________________________   ____________________________________________________________________________________________  Medication Recommendations and Reminders  Applies to: All patients receiving prescriptions (written and/or electronic).  Medication Rules & Regulations: These rules and regulations exist for your safety and that of others. They are not flexible and neither are we. Dismissing or ignoring them will be considered "non-compliance" with medication therapy, resulting in complete and irreversible termination of such therapy. (See document titled "Medication Rules" for more details.) In all conscience, because of safety reasons, we cannot continue providing a therapy where the patient does not follow instructions.  Pharmacy of record:   Definition: This is the pharmacy where your electronic prescriptions will be sent.   We do not endorse any particular pharmacy.  You are not restricted in your choice of pharmacy.  The pharmacy listed in the electronic medical record should be the one where you want electronic prescriptions to be sent.  If you choose to change pharmacy, simply notify our nursing staff of your choice of new pharmacy.  Recommendations:  Keep all of your pain medications in a safe place, under lock and key, even if you live alone.   After you fill your prescription, take 1 week's worth of pills and put them away in a safe place. You should keep a separate, properly labeled bottle for this purpose. The remainder should be kept in the original bottle. Use this as your primary supply, until it runs out. Once it's gone, then you know that you have 1 week's worth of medicine, and it is time to come in for a prescription refill. If you do this correctly, it  is unlikely that you will ever run out of medicine.  To make sure that the above recommendation works,   it is very important that you make sure your medication refill appointments are scheduled at least 1 week before you run out of medicine. To do this in an effective manner, make sure that you do not leave the office without scheduling your next medication management appointment. Always ask the nursing staff to show you in your prescription , when your medication will be running out. Then arrange for the receptionist to get you a return appointment, at least 7 days before you run out of medicine. Do not wait until you have 1 or 2 pills left, to come in. This is very poor planning and does not take into consideration that we may need to cancel appointments due to bad weather, sickness, or emergencies affecting our staff.  "Partial Fill": If for any reason your pharmacy does not have enough pills/tablets to completely fill or refill your prescription, do not allow for a "partial fill". You will need a separate prescription to fill the remaining amount, which we will not provide. If the reason for the partial fill is your insurance, you will need to talk to the pharmacist about payment alternatives for the remaining tablets, but again, do not accept a partial fill.  Prescription refills and/or changes in medication(s):   Prescription refills, and/or changes in dose or medication, will be conducted only during scheduled medication management appointments. (Applies to both, written and electronic prescriptions.)  No refills on procedure days. No medication will be changed or started on procedure days. No changes, adjustments, and/or refills will be conducted on a procedure day. Doing so will interfere with the diagnostic portion of the procedure.  No phone refills. No medications will be "called into the pharmacy".  No Fax refills.  No weekend refills.  No Holliday refills.  No after hours  refills.  Remember:  Business hours are:  Monday to Thursday 8:00 AM to 4:00 PM Provider's Schedule: Jordi Kamm, MD - Appointments are:  Medication management: Monday and Wednesday 8:00 AM to 4:00 PM Procedure day: Tuesday and Thursday 7:30 AM to 4:00 PM Bilal Lateef, MD - Appointments are:  Medication management: Tuesday and Thursday 8:00 AM to 4:00 PM Procedure day: Monday and Wednesday 7:30 AM to 4:00 PM (Last update: 05/08/2017) ____________________________________________________________________________________________   ____________________________________________________________________________________________  CANNABIDIOL (AKA: CBD Oil or Pills)  Applies to: All patients receiving prescriptions of controlled substances (written and/or electronic).  General Information: Cannabidiol (CBD) was discovered in 1940. It is one of some 113 identified cannabinoids in cannabis (Marijuana) plants, accounting for up to 40% of the plant's extract. As of 2018, preliminary clinical research on cannabidiol included studies of anxiety, cognition, movement disorders, and pain.  Cannabidiol is consummed in multiple ways, including inhalation of cannabis smoke or vapor, as an aerosol spray into the cheek, and by mouth. It may be supplied as CBD oil containing CBD as the active ingredient (no added tetrahydrocannabinol (THC) or terpenes), a full-plant CBD-dominant hemp extract oil, capsules, dried cannabis, or as a liquid solution. CBD is thought not have the same psychoactivity as THC, and may affect the actions of THC. Studies suggest that CBD may interact with different biological targets, including cannabinoid receptors and other neurotransmitter receptors. As of 2018 the mechanism of action for its biological effects has not been determined.  In the [place], cannabidiol has a limited approval by the Food and Drug Administration (FDA) for treatment of only two types of epilepsy  disorders. The side effects of long-term use of the drug include somnolence, decreased appetite, diarrhea,   fatigue, malaise, weakness, sleeping problems, and others.  CBD remains a Schedule I drug prohibited for any use.  Legality: Some manufacturers ship CBD products nationally, an illegal action which the FDA has not enforced in 2018, with CBD remaining the subject of an FDA investigational new drug evaluation, and is not considered legal as a dietary supplement or food ingredient as of December 2018. Federal illegality has made it difficult historically to conduct research on CBD. CBD is openly sold in head shops and health food stores in some states where such sales have not been explicitly legalized.  Warning: Because it is not FDA approved for general use or treatment of pain, it is not required to undergo the same manufacturing controls as prescription drugs.  This means that the available cannabidiol (CBD) may be contaminated with THC.  If this is the case, it will trigger a positive urine drug screen (UDS) test for cannabinoids (Marijuana).  Because a positive UDS for illicit substances is a violation of our medication agreement, your opioid analgesics (pain medicine) may be permanently discontinued. (Last update: 05/29/2017) ____________________________________________________________________________________________    

## 2018-11-18 MED FILL — oxyCODONE HCL 5 MG TABS: 5 | 30 days supply | Qty: 150 | Fill #0

## 2018-11-28 ENCOUNTER — Other Ambulatory Visit: Payer: Self-pay | Admitting: Pain Medicine

## 2018-11-28 DIAGNOSIS — M792 Neuralgia and neuritis, unspecified: Secondary | ICD-10-CM

## 2018-12-14 MED FILL — METOPROLOL TARTRATE 50 MG T: 50 | 30 days supply | Qty: 90 | Fill #0

## 2018-12-14 MED FILL — GABAPENTIN 100 MG CAPSULE: 100 | 30 days supply | Qty: 180 | Fill #1

## 2018-12-18 MED FILL — oxyCODONE HCL 5 MG TABS: 5 | 30 days supply | Qty: 150 | Fill #0

## 2019-01-11 MED FILL — GABAPENTIN 100 MG CAPSULE: 100 | 30 days supply | Qty: 180 | Fill #2

## 2019-01-11 MED FILL — METOPROLOL TARTRATE 50 MG T: 50 | 30 days supply | Qty: 90 | Fill #1

## 2019-02-03 ENCOUNTER — Telehealth: Payer: Self-pay | Admitting: Pain Medicine

## 2019-02-03 NOTE — Telephone Encounter (Signed)
Left voicemail to please return the call re; medication list prior to appt on Monday.

## 2019-02-03 NOTE — Telephone Encounter (Signed)
Returning Nurse call

## 2019-02-08 ENCOUNTER — Encounter: Payer: Self-pay | Admitting: Pain Medicine

## 2019-02-08 ENCOUNTER — Ambulatory Visit: Payer: Self-pay | Attending: Pain Medicine | Admitting: Pain Medicine

## 2019-02-08 ENCOUNTER — Other Ambulatory Visit: Payer: Self-pay

## 2019-02-08 DIAGNOSIS — G8929 Other chronic pain: Secondary | ICD-10-CM

## 2019-02-08 DIAGNOSIS — M5442 Lumbago with sciatica, left side: Secondary | ICD-10-CM

## 2019-02-08 DIAGNOSIS — I4729 Other ventricular tachycardia: Secondary | ICD-10-CM

## 2019-02-08 DIAGNOSIS — G5793 Unspecified mononeuropathy of bilateral lower limbs: Secondary | ICD-10-CM

## 2019-02-08 DIAGNOSIS — M7918 Myalgia, other site: Secondary | ICD-10-CM

## 2019-02-08 DIAGNOSIS — M62830 Muscle spasm of back: Secondary | ICD-10-CM

## 2019-02-08 DIAGNOSIS — M5416 Radiculopathy, lumbar region: Secondary | ICD-10-CM

## 2019-02-08 DIAGNOSIS — M5441 Lumbago with sciatica, right side: Secondary | ICD-10-CM

## 2019-02-08 DIAGNOSIS — I472 Ventricular tachycardia, unspecified: Secondary | ICD-10-CM

## 2019-02-08 DIAGNOSIS — I4581 Long QT syndrome: Secondary | ICD-10-CM

## 2019-02-08 DIAGNOSIS — M792 Neuralgia and neuritis, unspecified: Secondary | ICD-10-CM

## 2019-02-08 DIAGNOSIS — G894 Chronic pain syndrome: Secondary | ICD-10-CM

## 2019-02-08 DIAGNOSIS — R110 Nausea: Secondary | ICD-10-CM

## 2019-02-08 MED ORDER — BACLOFEN 10 MG PO TABS: 10.0000 mg | ORAL_TABLET | Freq: Three times a day (TID) | ORAL | 5 refills | Status: DC

## 2019-02-08 MED ORDER — GABAPENTIN 100 MG PO CAPS: 200.0000 mg | ORAL_CAPSULE | Freq: Three times a day (TID) | ORAL | 5 refills | Status: DC

## 2019-02-08 MED ORDER — OXYCODONE HCL 5 MG PO TABS: 5.0000 mg | ORAL_TABLET | Freq: Every day | ORAL | 0 refills | Status: DC

## 2019-02-08 MED ORDER — PROMETHAZINE HCL 12.5 MG PO TABS: 12.5000 mg | ORAL_TABLET | Freq: Every day | ORAL | 0 refills | Status: DC | PRN

## 2019-02-08 MED ORDER — MAGNESIUM OXIDE -MG SUPPLEMENT 500 MG PO CAPS: 1.0000 | ORAL_CAPSULE | Freq: Two times a day (BID) | ORAL | 11 refills | Status: DC

## 2019-02-08 MED FILL — PROMETHAZINE 12.5 MG TABLET: 12.5 | 30 days supply | Qty: 15 | Fill #0

## 2019-02-08 MED FILL — GABAPENTIN 100 MG CAPSULE: 100 | 30 days supply | Qty: 180 | Fill #0

## 2019-02-08 MED FILL — BACLOFEN 10 MG TABS: 10 | 30 days supply | Qty: 90 | Fill #0

## 2019-02-08 MED FILL — MAGNESIUM OXIDE 500 MG TAB: 500 | 50 days supply | Qty: 100 | Fill #0

## 2019-02-08 NOTE — Patient Instructions (Signed)
____________________________________________________________________________________________  Drug Holidays (Slow)  What is a "Drug Holiday"? Drug Holiday: is the name given to the period of time during which a patient stops taking a medication(s) for the purpose of eliminating tolerance to the drug.  Benefits . Improved effectiveness of opioids. . Decreased opioid dose needed to achieve benefits. . Improved pain with lesser dose.  What is tolerance? Tolerance: is the progressive decreased in effectiveness of a drug due to its repetitive use. With repetitive use, the body gets use to the medication and as a consequence, it loses its effectiveness. This is a common problem seen with opioid pain medications. As a result, a larger dose of the drug is needed to achieve the same effect that used to be obtained with a smaller dose.  How long should a "Drug Holiday" last? You should stay off of the pain medicine for at least 14 consecutive days. (2 weeks)  Should I stop the medicine "cold turkey"? No. You should always coordinate with your Pain Specialist so that he/she can provide you with the correct medication dose to make the transition as smoothly as possible.  How do I stop the medicine? Slowly. You will be instructed to decrease the daily amount of pills that you take by one (1) pill every seven (7) days. This is called a "slow downward taper" of your dose. For example: if you normally take four (4) pills per day, you will be asked to drop this dose to three (3) pills per day for seven (7) days, then to two (2) pills per day for seven (7) days, then to one (1) per day for seven (7) days, and at the end of those last seven (7) days, this is when the "Drug Holiday" would start.   Will I have withdrawals? By doing a "slow downward taper" like this one, it is unlikely that you will experience any significant withdrawal symptoms. Typically, what triggers withdrawals is the sudden stop of a high  dose opioid therapy. Withdrawals can usually be avoided by slowly decreasing the dose over a prolonged period of time.  What are withdrawals? Withdrawals: refers to the wide range of symptoms that occur after stopping or dramatically reducing opiate drugs after heavy and prolonged use. Withdrawal symptoms do not occur to patients that use low dose opioids, or those who take the medication sporadically. Contrary to benzodiazepine (example: Valium, Xanax, etc.) or alcohol withdrawals ("Delirium Tremens"), opioid withdrawals are not lethal. Withdrawals are the physical manifestation of the body getting rid of the excess receptors.  Expected Symptoms Early symptoms of withdrawal may include: . Agitation . Anxiety . Muscle aches . Increased tearing . Insomnia . Runny nose . Sweating . Yawning  Late symptoms of withdrawal may include: . Abdominal cramping . Diarrhea . Dilated pupils . Goose bumps . Nausea . Vomiting  Will I experience withdrawals? Due to the slow nature of the taper, it is very unlikely that you will experience any.  What is a slow taper? Taper: refers to the gradual decrease in dose.  ___________________________________________________________________________________________     

## 2019-02-08 NOTE — Progress Notes (Signed)
Pain Management Virtual Encounter Note - Virtual Visit via Telephone Telehealth (real-time audio visits between healthcare provider and patient).   Patient's Phone No. & Preferred Pharmacy:  [phone] (home); [phone] (mobile); (Preferred) [phone] No e-mail address on record  [name] [address] Phone: [phone] Fax: [phone]  [id number] [name] [place] [address] Phone: [phone] Fax: [phone]    Pre-screening note:  Our staff contacted [name] and offered him an "in person", "face-to-face" appointment versus a telephone encounter. He indicated preferring the telephone encounter, at this time.   Reason for Virtual Visit: COVID-19*  Social distancing based on CDC and AMA recommendations.   I contacted [name] on 02/08/2019 via telephone.      I clearly identified myself as [name], MD. I verified that I was speaking with the correct person using two identifiers (Name: Mark [name], and date of birth: August 31, 1965).  Advanced Informed Consent I sought verbal advanced consent from [name] for virtual visit interactions. I informed [name] of possible security and privacy concerns, risks, and limitations associated with providing "not-in-person" medical evaluation and management services. I also informed [name] of the availability of "in-person" appointments. Finally, I informed him that there would be a charge for the virtual visit and that he could be  personally, fully or partially, financially responsible for it. [name] expressed understanding and agreed to proceed.   Historic Elements   [name] is a 53 y.o. year old, male patient evaluated today after his last encounter by our practice on 02/03/2019. Mr. Enwright  has a past medical history of Alcohol  withdrawal ([place]), Allergy, Anxiety (Dx 2014), COPD (chronic obstructive pulmonary disease) ([place]), Drug abuse ([place]), Enlarged prostate, ETOH abuse, GERD (gastroesophageal reflux disease) (Dx 2003), Headache(784.0), Hepatitis, Hyperlipidemia (Dx 2000), Hypertension (Dx 2011), IBS (irritable bowel syndrome), Irregular heart beat, Long Q-T syndrome (01/23/2014), Mental disorder, Neuromuscular disorder ([place]), Neuropathy (06/07/2013), Seizure due to alcohol withdrawal ([place]) (01/23/2014), Shortness of breath, and Withdrawal seizures ([place]) (2014). He also  has a past surgical history that includes Back surgery (1995, 1999); Neck surgery (2000); Nasal sinus surgery; left heart catheterization with coronary angiogram (N/A, 01/13/2014); Subacromial decompression (Right, 11/16/2014); Nissen fundoplication; 24 hour ph study (N/A, 01/16/2015); Upper gastrointestinal endoscopy; Colonoscopy (1998); and Shoulder surgery (Right). [name] has a current medication list which includes the following prescription(s): albuterol, amlodipine, baclofen, famotidine, gabapentin, magnesium oxide, metoprolol succinate, multiple vitamins-minerals, omeprazole, oxycodone, oxycodone, oxycodone, promethazine, thiamine, and trazodone. He  reports that he has been smoking cigarettes. He has a 15.00 pack-year smoking history. He has never used smokeless tobacco. He reports current alcohol use. He reports that he does not use drugs. [name] is allergic to cozaar [losartan] and lisinopril.   HPI  Today, he is being contacted for medication management.  The patient indicates doing well with the current medication regimen. No adverse reactions or side effects reported to the medications.  He is no longer taking the Flexeril.  Pharmacotherapy Assessment  Analgesic: Oxycodone IR 5 mg, 1 tab PO 5x/day (25 mg/day of oxycodone) MME/day: 37.5 mg/day.   Monitoring: Pharmacotherapy: No side-effects or adverse reactions reported. [place] PMP: PDMP reviewed  during this encounter.       Compliance: No problems identified. Effectiveness: Clinically acceptable. Plan: Refer to "POC".  UDS:  Summary  Date Value Ref Range Status  04/20/2018 FINAL  Final    Comment:    ==================================================================== TOXASSURE SELECT 13 (MW) ==================================================================== Test                             Result       Flag       Units Drug Present and Declared for Prescription Verification   Tramadol                       >4274        EXPECTED   ng/mg creat   O-Desmethyltramadol            >4274        EXPECTED   ng/mg creat   N-Desmethyltramadol            1461         EXPECTED   ng/mg creat    Source of tramadol is a prescription medication.    O-desmethyltramadol and N-desmethyltramadol are expected    metabolites of tramadol. ==================================================================== Test                      Result    Flag   Units      Ref Range   Creatinine              117              mg/dL      >=20 ==================================================================== Declared Medications:  The flagging and interpretation on this report are based on the  following declared medications.  Unexpected results may arise from  inaccuracies in the declared medications.  **Note: The testing scope of this panel includes these medications:  Tramadol  **Note: The testing scope of this panel does not include following  reported medications:  Albuterol  Cyclobenzaprine  Famotidine  Gabapentin  Metoprolol  Omeprazole  Promethazine  Trazodone ==================================================================== For clinical consultation, please call [phone]. ====================================================================    Laboratory Chemistry Profile (12 mo)  Renal: 08/16/2018: BUN 5; Creatinine, Ser 0.76  Lab Results  Component Value Date   GFRAA >60  08/16/2018   GFRNONAA >60 08/16/2018   Hepatic: 08/16/2018: Albumin 3.6 Lab Results  Component Value Date   AST 67 (H) 08/16/2018   ALT 66 (H) 08/16/2018   Other: No results found for requested labs within last 8760 hours. Note: Above Lab results reviewed.  Imaging  DG C-Arm 1-60 Min-No Report Fluoroscopy was utilized by the requesting physician.  No radiographic  interpretation.    Assessment  The primary encounter diagnosis was Chronic pain syndrome. Diagnoses of Lower extremity neuropathy (Primary Area of Pain) (Bilateral) (R>L), Chronic low back pain (Secondary area of Pain) (Bilateral) (R>L), Chronic radicular low back pain (Third area of Pain) (Right) (to calf), Chronic musculoskeletal pain, Muscle spasm of back, Long Q-T syndrome, Paroxysmal VT ([place]), Musculoskeletal pain, Nausea in adult, and Neurogenic pain were also pertinent to this visit.  Plan of Care  Problem-specific:  No problem-specific Assessment & Plan notes found for this encounter.  I have discontinued Gaston Islam. Didion's cyclobenzaprine. I am also having him start on oxyCODONE and oxyCODONE. Additionally, I am having him maintain his albuterol, traZODone, omeprazole, famotidine, metoprolol succinate, Multiple Vitamins-Minerals (CENTRUM SILVER 50+MEN PO), thiamine, amLODipine, Magnesium Oxide, baclofen, oxyCODONE, promethazine, and gabapentin.  Pharmacotherapy (Medications Ordered): Meds ordered this encounter  Medications  . Magnesium Oxide 500 MG CAPS    Sig: Take 1 capsule (  500 mg total) by mouth 2 (two) times daily at 8 am and 10 pm.    Dispense:  60 capsule    Refill:  11    Fill one day early if pharmacy is closed on scheduled refill date. May substitute for generic if available.  . baclofen (LIORESAL) 10 MG tablet    Sig: Take 1 tablet (10 mg total) by mouth 3 (three) times daily.    Dispense:  90 tablet    Refill:  5    Fill one day early if pharmacy is closed on scheduled refill date. May substitute  for generic if available.  Marland Kitchen oxyCODONE (OXY IR/ROXICODONE) 5 MG immediate release tablet    Sig: Take 1 tablet (5 mg total) by mouth 5 (five) times daily. Must last 30 days.    Dispense:  150 tablet    Refill:  0    Chronic Pain: STOP Act (Not applicable) Fill 1 day early if closed on refill date. Do not fill until: 02/16/2019. To last until: 03/18/2019. Avoid benzodiazepines within 8 hours of opioids  . oxyCODONE (OXY IR/ROXICODONE) 5 MG immediate release tablet    Sig: Take 1 tablet (5 mg total) by mouth 5 (five) times daily. Must last 30 days.    Dispense:  150 tablet    Refill:  0    Chronic Pain: STOP Act (Not applicable) Fill 1 day early if closed on refill date. Do not fill until: 03/18/2019. To last until: 04/17/2019. Avoid benzodiazepines within 8 hours of opioids  . oxyCODONE (OXY IR/ROXICODONE) 5 MG immediate release tablet    Sig: Take 1 tablet (5 mg total) by mouth 5 (five) times daily. Must last 30 days.    Dispense:  150 tablet    Refill:  0    Chronic Pain: STOP Act (Not applicable) Fill 1 day early if closed on refill date. Do not fill until: 04/17/2019. To last until: 05/17/2019. Avoid benzodiazepines within 8 hours of opioids  . promethazine (PHENERGAN) 12.5 MG tablet    Sig: Take 1 tablet (12.5 mg total) by mouth daily as needed for nausea or vomiting. Must last 30 days    Dispense:  15 tablet    Refill:  0    Fill one day early if pharmacy is closed on scheduled refill date. May substitute for generic if available.  . gabapentin (NEURONTIN) 100 MG capsule    Sig: Take 2 capsules (200 mg total) by mouth 3 (three) times daily.    Dispense:  180 capsule    Refill:  5    Fill one day early if pharmacy is closed on scheduled refill date. May substitute for generic if available.   Orders:  No orders of the defined types were placed in this encounter.  Follow-up plan:   Return in about 14 weeks (around 05/17/2019) for (VV).      Interventional management options:  Considering:    Lower extremity EMG/PNCV. (Radiculopathy versus peripheral neuropathy) Upper extremity EMG/PNCV. Possibleright-sided L3 + L4 Lumbarsympathetic RFA Possible Racz procedure Diagnostic CESI  Diagnostic bilateral cervical facet block  Possible bilateral cervical facet RFA Possible bilateral lumbar facet RFA Diagnostic right IA shoulder joint injection Diagnostic right suprascapular NB Possibleright suprascapular RFA Diagnostic bilateral IA knee joint injection Possible bilateral series of 5 Hyal[place] knee injections Diagnostic bilateral Genicular NB Possible bilateral Genicular RFA Diagnostic bilateral greater occipital NB Possible bilateral greater occipital nerve RFA Diagnostic bilateral C2+TON NB Possible bilateral C2 + TON RFA   Palliative PRN treatment(s):  Palliative right IA hip joint injection #2  Diagnostic right lumbar facet block #3  Diagnostic left lumbar facet block #2  Palliative caudal ESI + epidurogram #3  Palliative right lumbar sympathetic block #3  Palliative right lumbar sympathetic RFA #2 (last done 12/26/2016)  Diagnostic right SI joint block #2      Recent Visits No visits were found meeting these conditions.  Showing recent visits within past 90 days and meeting all other requirements   Today's Visits Date Type Provider Dept  02/08/19 Telemedicine Milinda Pointer, MD [facility]  Showing today's visits and meeting all other requirements   Future Appointments No visits were found meeting these conditions.  Showing future appointments within next 90 days and meeting all other requirements   I discussed the assessment and treatment plan with the patient. The patient was provided an opportunity to ask questions and all were answered. The patient agreed with the plan and demonstrated an understanding of the instructions.  Patient advised to call back or seek an in-person evaluation if the symptoms or condition worsens.  Total  duration of non-face-to-face encounter: 13 minutes.  Note by: [name], MD Date: 02/08/2019; Time: 1:18 PM  Note: This dictation was prepared with Dragon dictation. Any transcriptional errors that may result from this process are unintentional.  Disclaimer:  * Given the special circumstances of the COVID-19 pandemic, the federal government has announced that the Office for Civil Rights (OCR) will exercise its enforcement discretion and will not impose penalties on physicians using telehealth in the event of noncompliance with regulatory requirements under the [place] (HIPAA) in connection with the good faith provision of telehealth during the XX123456 national public health emergency. ([place])

## 2019-02-11 ENCOUNTER — Other Ambulatory Visit: Payer: Self-pay

## 2019-02-11 ENCOUNTER — Inpatient Hospital Stay (HOSPITAL_COMMUNITY)
Admission: EM | Admit: 2019-02-11 | Discharge: 2019-02-14 | DRG: 897 | Disposition: A | Discharge status: Home / Self Care | Payer: Self-pay | Attending: Family Medicine | Admitting: Family Medicine

## 2019-02-11 ENCOUNTER — Emergency Department (HOSPITAL_COMMUNITY): Payer: Self-pay

## 2019-02-11 ENCOUNTER — Encounter (HOSPITAL_COMMUNITY): Payer: Self-pay

## 2019-02-11 DIAGNOSIS — K219 Gastro-esophageal reflux disease without esophagitis: Secondary | ICD-10-CM | POA: Diagnosis present

## 2019-02-11 DIAGNOSIS — J302 Other seasonal allergic rhinitis: Secondary | ICD-10-CM | POA: Diagnosis present

## 2019-02-11 DIAGNOSIS — Z915 Personal history of self-harm: Secondary | ICD-10-CM

## 2019-02-11 DIAGNOSIS — J449 Chronic obstructive pulmonary disease, unspecified: Secondary | ICD-10-CM | POA: Diagnosis present

## 2019-02-11 DIAGNOSIS — I493 Ventricular premature depolarization: Secondary | ICD-10-CM | POA: Diagnosis present

## 2019-02-11 DIAGNOSIS — F10939 Alcohol use, unspecified with withdrawal, unspecified: Secondary | ICD-10-CM | POA: Diagnosis present

## 2019-02-11 DIAGNOSIS — F411 Generalized anxiety disorder: Secondary | ICD-10-CM | POA: Diagnosis present

## 2019-02-11 DIAGNOSIS — F1120 Opioid dependence, uncomplicated: Secondary | ICD-10-CM | POA: Diagnosis present

## 2019-02-11 DIAGNOSIS — R008 Other abnormalities of heart beat: Secondary | ICD-10-CM | POA: Diagnosis present

## 2019-02-11 DIAGNOSIS — G894 Chronic pain syndrome: Secondary | ICD-10-CM | POA: Diagnosis present

## 2019-02-11 DIAGNOSIS — N400 Enlarged prostate without lower urinary tract symptoms: Secondary | ICD-10-CM | POA: Diagnosis present

## 2019-02-11 DIAGNOSIS — I472 Ventricular tachycardia, unspecified: Secondary | ICD-10-CM

## 2019-02-11 DIAGNOSIS — F1020 Alcohol dependence, uncomplicated: Secondary | ICD-10-CM | POA: Diagnosis present

## 2019-02-11 DIAGNOSIS — Z811 Family history of alcohol abuse and dependence: Secondary | ICD-10-CM

## 2019-02-11 DIAGNOSIS — I4729 Other ventricular tachycardia: Secondary | ICD-10-CM

## 2019-02-11 DIAGNOSIS — F1021 Alcohol dependence, in remission: Secondary | ICD-10-CM

## 2019-02-11 DIAGNOSIS — F339 Major depressive disorder, recurrent, unspecified: Secondary | ICD-10-CM | POA: Diagnosis present

## 2019-02-11 DIAGNOSIS — G4700 Insomnia, unspecified: Secondary | ICD-10-CM | POA: Diagnosis present

## 2019-02-11 DIAGNOSIS — E872 Acidosis: Secondary | ICD-10-CM | POA: Diagnosis present

## 2019-02-11 DIAGNOSIS — I10 Essential (primary) hypertension: Secondary | ICD-10-CM | POA: Diagnosis present

## 2019-02-11 DIAGNOSIS — I426 Alcoholic cardiomyopathy: Secondary | ICD-10-CM | POA: Diagnosis present

## 2019-02-11 DIAGNOSIS — Z801 Family history of malignant neoplasm of trachea, bronchus and lung: Secondary | ICD-10-CM

## 2019-02-11 DIAGNOSIS — Z20828 Contact with and (suspected) exposure to other viral communicable diseases: Secondary | ICD-10-CM | POA: Diagnosis present

## 2019-02-11 DIAGNOSIS — Z888 Allergy status to other drugs, medicaments and biological substances status: Secondary | ICD-10-CM

## 2019-02-11 DIAGNOSIS — E871 Hypo-osmolality and hyponatremia: Secondary | ICD-10-CM | POA: Diagnosis present

## 2019-02-11 DIAGNOSIS — D72829 Elevated white blood cell count, unspecified: Secondary | ICD-10-CM | POA: Diagnosis present

## 2019-02-11 DIAGNOSIS — E785 Hyperlipidemia, unspecified: Secondary | ICD-10-CM | POA: Diagnosis present

## 2019-02-11 DIAGNOSIS — F10230 Alcohol dependence with withdrawal, uncomplicated: Secondary | ICD-10-CM

## 2019-02-11 DIAGNOSIS — Z8249 Family history of ischemic heart disease and other diseases of the circulatory system: Secondary | ICD-10-CM

## 2019-02-11 DIAGNOSIS — Y906 Blood alcohol level of 120-199 mg/100 ml: Secondary | ICD-10-CM | POA: Diagnosis present

## 2019-02-11 DIAGNOSIS — F17210 Nicotine dependence, cigarettes, uncomplicated: Secondary | ICD-10-CM | POA: Diagnosis present

## 2019-02-11 DIAGNOSIS — Z981 Arthrodesis status: Secondary | ICD-10-CM

## 2019-02-11 DIAGNOSIS — Z79899 Other long term (current) drug therapy: Secondary | ICD-10-CM

## 2019-02-11 DIAGNOSIS — F10239 Alcohol dependence with withdrawal, unspecified: Principal | ICD-10-CM | POA: Diagnosis present

## 2019-02-11 DIAGNOSIS — F419 Anxiety disorder, unspecified: Secondary | ICD-10-CM | POA: Diagnosis present

## 2019-02-11 DIAGNOSIS — K582 Mixed irritable bowel syndrome: Secondary | ICD-10-CM | POA: Diagnosis present

## 2019-02-11 LAB — CBC WITH DIFFERENTIAL/PLATELET
Abs Immature Granulocytes: 0.1 10*3/uL — ABNORMAL HIGH (ref 0.00–0.07)
Basophils Absolute: 0.1 10*3/uL (ref 0.0–0.1)
Basophils Relative: 1 %
Eosinophils Absolute: 0.1 10*3/uL (ref 0.0–0.5)
Eosinophils Relative: 1 %
HCT: 54.3 % — ABNORMAL HIGH (ref 39.0–52.0)
Hemoglobin: 18.9 g/dL — ABNORMAL HIGH (ref 13.0–17.0)
Immature Granulocytes: 1 %
Lymphocytes Relative: 31 %
Lymphs Abs: 2.7 10*3/uL (ref 0.7–4.0)
MCH: 28.7 pg (ref 26.0–34.0)
MCHC: 34.8 g/dL (ref 30.0–36.0)
MCV: 82.5 fL (ref 80.0–100.0)
Monocytes Absolute: 0.5 10*3/uL (ref 0.1–1.0)
Monocytes Relative: 6 %
Neutro Abs: 5.2 10*3/uL (ref 1.7–7.7)
Neutrophils Relative %: 60 %
Platelets: 316 10*3/uL (ref 150–400)
RBC: 6.58 MIL/uL — ABNORMAL HIGH (ref 4.22–5.81)
RDW: 12.3 % (ref 11.5–15.5)
WBC: 8.7 10*3/uL (ref 4.0–10.5)
nRBC: 0 % (ref 0.0–0.2)

## 2019-02-11 LAB — RAPID URINE DRUG SCREEN, HOSP PERFORMED
Amphetamines: NOT DETECTED
Barbiturates: NOT DETECTED
Benzodiazepines: NOT DETECTED
Cocaine: NOT DETECTED
Opiates: NOT DETECTED
Tetrahydrocannabinol: NOT DETECTED

## 2019-02-11 LAB — URINALYSIS, ROUTINE W REFLEX MICROSCOPIC
Bilirubin Urine: NEGATIVE
Glucose, UA: NEGATIVE mg/dL
Hgb urine dipstick: NEGATIVE
Ketones, ur: NEGATIVE mg/dL
Leukocytes,Ua: NEGATIVE
Nitrite: NEGATIVE
Protein, ur: NEGATIVE mg/dL
Specific Gravity, Urine: 1.002 — ABNORMAL LOW (ref 1.005–1.030)
pH: 6 (ref 5.0–8.0)

## 2019-02-11 LAB — COMPREHENSIVE METABOLIC PANEL
ALT: 58 U/L — ABNORMAL HIGH (ref 0–44)
AST: 60 U/L — ABNORMAL HIGH (ref 15–41)
Albumin: 3.7 g/dL (ref 3.5–5.0)
Alkaline Phosphatase: 106 U/L (ref 38–126)
Anion gap: 15 (ref 5–15)
BUN: <5 mg/dL — ABNORMAL LOW (ref 6–20)
CO2: 23 mmol/L (ref 22–32)
Calcium: 8.6 mg/dL — ABNORMAL LOW (ref 8.9–10.3)
Chloride: 94 mmol/L — ABNORMAL LOW (ref 98–111)
Creatinine, Ser: 0.65 mg/dL (ref 0.61–1.24)
GFR calc Af Amer: >60 mL/min (ref >60)
GFR calc non Af Amer: >60 mL/min (ref >60)
Glucose, Bld: 117 mg/dL — ABNORMAL HIGH (ref 70–99)
Potassium: 3.6 mmol/L (ref 3.5–5.1)
Sodium: 132 mmol/L — ABNORMAL LOW (ref 135–145)
Total Bilirubin: 0.5 mg/dL (ref 0.3–1.2)
Total Protein: 6.1 g/dL — ABNORMAL LOW (ref 6.5–8.1)

## 2019-02-11 LAB — TROPONIN I (HIGH SENSITIVITY): Troponin I (High Sensitivity): 4 ng/L (ref <18)

## 2019-02-11 LAB — BRAIN NATRIURETIC PEPTIDE: B Natriuretic Peptide: 64.9 pg/mL (ref 0.0–100.0)

## 2019-02-11 LAB — ETHANOL: Alcohol, Ethyl (B): 127 mg/dL — ABNORMAL HIGH (ref <10)

## 2019-02-11 LAB — MAGNESIUM: Magnesium: 1.9 mg/dL (ref 1.7–2.4)

## 2019-02-11 LAB — CBG MONITORING, ED: Glucose-Capillary: 128 mg/dL — ABNORMAL HIGH (ref 70–99)

## 2019-02-11 MED ORDER — THIAMINE HCL 100 MG/ML IJ SOLN: 100.0000 mg | Freq: Every day | INTRAMUSCULAR | Status: DC

## 2019-02-11 MED ORDER — METOPROLOL SUCCINATE ER 50 MG PO TB24
50.0000 mg | ORAL_TABLET | Freq: Three times a day (TID) | ORAL | Status: DC
  Administered 2019-02-11 – 2019-02-13 (×7): 50 mg via ORAL
  Filled 2019-02-11 (×2): qty 1
  Filled 2019-02-11: qty 2
  Filled 2019-02-11 (×2): qty 1
  Filled 2019-02-11: qty 2
  Filled 2019-02-11: qty 1

## 2019-02-11 MED ORDER — MAGNESIUM SULFATE IN D5W 1-5 GM/100ML-% IV SOLN
1.0000 g | Freq: Once | INTRAVENOUS | Status: DC
  Filled 2019-02-11: qty 100

## 2019-02-11 MED ORDER — MAGNESIUM OXIDE 400 (241.3 MG) MG PO TABS
400.0000 mg | ORAL_TABLET | Freq: Two times a day (BID) | ORAL | Status: DC
  Administered 2019-02-11 – 2019-02-13 (×5): 400 mg via ORAL
  Filled 2019-02-11 (×5): qty 1

## 2019-02-11 MED ORDER — ENOXAPARIN SODIUM 40 MG/0.4ML ~~LOC~~ SOLN
40.0000 mg | SUBCUTANEOUS | Status: DC
  Administered 2019-02-11 – 2019-02-13 (×3): 40 mg via SUBCUTANEOUS
  Filled 2019-02-11 (×3): qty 0.4

## 2019-02-11 MED ORDER — LORAZEPAM 1 MG PO TABS: 0.0000 mg | ORAL_TABLET | Freq: Two times a day (BID) | ORAL | Status: DC

## 2019-02-11 MED ORDER — FOLIC ACID 1 MG PO TABS
1.0000 mg | ORAL_TABLET | Freq: Every day | ORAL | Status: DC
  Administered 2019-02-12 – 2019-02-13 (×2): 1 mg via ORAL
  Filled 2019-02-11 (×2): qty 1

## 2019-02-11 MED ORDER — LORAZEPAM 2 MG/ML IJ SOLN: 0.0000 mg | Freq: Two times a day (BID) | INTRAMUSCULAR | Status: DC

## 2019-02-11 MED ORDER — VITAMIN B-1 100 MG PO TABS
100.0000 mg | ORAL_TABLET | Freq: Every day | ORAL | Status: DC
  Administered 2019-02-11: 100 mg via ORAL
  Filled 2019-02-11: qty 1

## 2019-02-11 MED ORDER — GABAPENTIN 100 MG PO CAPS
200.0000 mg | ORAL_CAPSULE | Freq: Three times a day (TID) | ORAL | Status: DC
  Administered 2019-02-11 – 2019-02-13 (×7): 200 mg via ORAL
  Filled 2019-02-11 (×7): qty 2

## 2019-02-11 MED ORDER — LORAZEPAM 1 MG PO TABS
1.0000 mg | ORAL_TABLET | ORAL | Status: DC | PRN
  Administered 2019-02-12 (×2): 1 mg via ORAL
  Administered 2019-02-13 (×2): 2 mg via ORAL
  Filled 2019-02-11 (×2): qty 2
  Filled 2019-02-11 (×2): qty 1
  Filled 2019-02-11: qty 2

## 2019-02-11 MED ORDER — MAGNESIUM SULFATE 2 GM/50ML IV SOLN
2.0000 g | Freq: Once | INTRAVENOUS | Status: AC
  Administered 2019-02-11: 2 g via INTRAVENOUS
  Filled 2019-02-11: qty 50

## 2019-02-11 MED ORDER — ADULT MULTIVITAMIN W/MINERALS CH
1.0000 | ORAL_TABLET | Freq: Every day | ORAL | Status: DC
  Administered 2019-02-12 – 2019-02-13 (×2): 1 via ORAL
  Filled 2019-02-11 (×2): qty 1

## 2019-02-11 MED ORDER — OXYCODONE HCL 5 MG PO TABS
5.0000 mg | ORAL_TABLET | Freq: Four times a day (QID) | ORAL | Status: DC | PRN
  Administered 2019-02-11 – 2019-02-14 (×7): 5 mg via ORAL
  Filled 2019-02-11 (×7): qty 1

## 2019-02-11 MED ORDER — VITAMIN B-1 100 MG PO TABS
100.0000 mg | ORAL_TABLET | Freq: Every day | ORAL | Status: DC
  Administered 2019-02-12 – 2019-02-13 (×2): 100 mg via ORAL
  Filled 2019-02-11 (×2): qty 1

## 2019-02-11 MED ORDER — SODIUM CHLORIDE 0.9 % IV BOLUS
500.0000 mL | Freq: Once | INTRAVENOUS | Status: AC
  Administered 2019-02-11: 500 mL via INTRAVENOUS

## 2019-02-11 MED ORDER — LORAZEPAM 2 MG/ML IJ SOLN
0.0000 mg | Freq: Four times a day (QID) | INTRAMUSCULAR | Status: DC
  Administered 2019-02-11: 2 mg via INTRAVENOUS
  Filled 2019-02-11: qty 1

## 2019-02-11 MED ORDER — ACETAMINOPHEN 650 MG RE SUPP: 650.0000 mg | Freq: Four times a day (QID) | RECTAL | Status: DC | PRN

## 2019-02-11 MED ORDER — ACETAMINOPHEN 325 MG PO TABS
650.0000 mg | ORAL_TABLET | Freq: Four times a day (QID) | ORAL | Status: DC | PRN
  Administered 2019-02-12: 650 mg via ORAL
  Filled 2019-02-11: qty 2

## 2019-02-11 MED ORDER — NICOTINE 21 MG/24HR TD PT24
21.0000 mg | MEDICATED_PATCH | Freq: Every day | TRANSDERMAL | Status: DC
  Administered 2019-02-11: 21 mg via TRANSDERMAL
  Filled 2019-02-11: qty 1

## 2019-02-11 MED ORDER — LORAZEPAM 1 MG PO TABS: 0.0000 mg | ORAL_TABLET | Freq: Four times a day (QID) | ORAL | Status: DC

## 2019-02-11 MED ORDER — MAGNESIUM OXIDE -MG SUPPLEMENT 500 MG PO CAPS: 1.0000 | ORAL_CAPSULE | Freq: Two times a day (BID) | ORAL | Status: DC

## 2019-02-11 MED ORDER — LORAZEPAM 2 MG/ML IJ SOLN
1.0000 mg | INTRAMUSCULAR | Status: DC | PRN
  Administered 2019-02-11 – 2019-02-13 (×5): 2 mg via INTRAVENOUS
  Administered 2019-02-13: 4 mg via INTRAVENOUS
  Administered 2019-02-13 (×3): 2 mg via INTRAVENOUS
  Filled 2019-02-11 (×3): qty 1
  Filled 2019-02-11: qty 2
  Filled 2019-02-11 (×5): qty 1

## 2019-02-11 NOTE — ED Notes (Signed)
Admin pt's meds and noted that pt is having  3-5 beat runs of vtach multiple times a minute. Noted to be non sustained, pt endorsing ongoing chest pain and fatigue. PA and MD made immediately aware, pt placed on Zoll and pads. Denies other acute needs at this time, denies hx of same. IV access in place.

## 2019-02-11 NOTE — ED Triage Notes (Signed)
Pt in from home via [facility] with c/o Percocet and ETOH withdrawal. Hx of back injury and has taken Percocet since 90's. Ran out of meds 3 days ago, reports drinking a 12 pk around lunch time today. Complains of chest pressure and generalized pain, n/v. EMS reports frequent PVC's and bigeminy en route

## 2019-02-11 NOTE — H&P (Signed)
History and Physical    Mark [name] [id number] DOB: 07-29-1965 DOA: 02/11/2019  PCP: Ladell Pier, MD  Patient coming from: Home.  Chief Complaint: Withdrawal symptoms.  HPI: Mark [name] is a 53 y.o. male with history of alcoholism, chronic pain uses Percocet ran out of his Percocet last 3 days and started having tremors and withdrawal-like symptoms.  He had some alcohol prior to coming.  Has been having some palpitation off and on which has been chronic at the time he gets also chest pressure.  Has had known history of multiple PVCs and nonsustained V. tach's before and per the chart it was related to his cardiomyopathy to the heart.  Denies any nausea vomiting abdominal pain shortness of breath or fever chills.  ED Course: In the ER patient was afebrile EKG was showing nonsustained V. tach at times.  ER physician I discussed with patient's cardiologist Dr. Terrence Dupont.  Patient's cardiac markers were negative high-sensitivity troponin and BNP unremarkable hemoglobin 18.9 patient was given magnesium sulfate LFTs were mildly elevated.  Alcohol level was 127 urine drug screen was negative.  Patient admitted for withdrawal symptoms and nonsustained V. tach.  COVID-19 test was negative.  Review of Systems: As per HPI, rest all negative.   Past Medical History:  Diagnosis Date   Alcohol withdrawal ([place])    Allergy    Anxiety Dx 2014   COPD (chronic obstructive pulmonary disease) ([place])    Drug abuse ([place])    pt reports opioid dependence due to previous back surgeries   Enlarged prostate    ETOH abuse    GERD (gastroesophageal reflux disease) Dx 2003   Headache(784.0)    Hepatitis    Hyperlipidemia Dx 2000   Hypertension Dx 2011   IBS (irritable bowel syndrome)    alternates constiaption/ diarrhea   Irregular heart beat    Long Q-T syndrome 01/23/2014   Mental disorder    Neuromuscular disorder ([place])    hands hurt    Neuropathy 06/07/2013   Seizure  due to alcohol withdrawal ([place]) 01/23/2014   Per patient report   Shortness of breath    Withdrawal seizures ([place]) 2014   last seizure 2014 etoh with drawal    Past Surgical History:  Procedure Laterality Date   44 HOUR [place] STUDY N/A 01/16/2015   Procedure: 24 HOUR PH STUDY;  Surgeon: Manus Gunning, MD;  Location: [facility];  Service: Gastroenterology;  Laterality: N/A;   [place] WITH CORONARY ANGIOGRAM N/A 01/13/2014   Procedure: LEFT HEART CATHETERIZATION WITH CORONARY ANGIOGRAM;  Surgeon: Clent Demark, MD;  Location: [facility];  Service: Cardiovascular;  Laterality: N/A;   NASAL SINUS SURGERY     NECK SURGERY  2000   cervial fusion    NISSEN FUNDOPLICATION     SHOULDER SURGERY Right    SUBACROMIAL DECOMPRESSION Right 11/16/2014   Procedure: RIGHT SHOULDER ARTHROSCOPY WITH SUBACROMIAL DECOMPRESSION ;  Surgeon: Leandrew Koyanagi, MD;  Location: [facility];  Service: Orthopedics;  Laterality: Right;   UPPER GASTROINTESTINAL ENDOSCOPY       reports that he has been smoking cigarettes. He has a 15.00 pack-year smoking history. He has never used smokeless tobacco. He reports current alcohol use. He reports that he does not use drugs.  Allergies  Allergen Reactions   Cozaar [Losartan] Anaphylaxis   Lisinopril Anaphylaxis    Family History  Problem Relation Age  of Onset   Lung cancer Father        was a smoker   Alcohol abuse Father    Hypertension Mother    Colon polyps Mother    Breast cancer Paternal Grandmother    Alcohol abuse Paternal Grandfather    Colon cancer Neg Hx    Rectal cancer Neg Hx    Stomach cancer Neg Hx     Prior to Admission medications   Medication Sig Start Date End Date Taking? Authorizing Provider  albuterol (VENTOLIN HFA) 108 (90 Base) MCG/ACT inhaler Inhale 2 puffs into the lungs every 6 (six) hours as needed for wheezing or shortness of  breath. 08/12/17  Yes Ladell Pier, MD  baclofen (LIORESAL) 10 MG tablet Take 1 tablet (10 mg total) by mouth 3 (three) times daily. Patient taking differently: Take 10 mg by mouth 3 (three) times daily as needed for muscle spasms.  02/08/19 08/07/19 Yes Milinda Pointer, MD  famotidine (PEPCID AC) 10 MG chewable tablet Chew 10 mg by mouth as needed for heartburn.   Yes [provider]  gabapentin (NEURONTIN) 100 MG capsule Take 2 capsules (200 mg total) by mouth 3 (three) times daily. 02/08/19 08/07/19 Yes Milinda Pointer, MD  Magnesium Oxide 500 MG CAPS Take 1 capsule (500 mg total) by mouth 2 (two) times daily at 8 am and 10 pm. 02/08/19 02/08/20 Yes Milinda Pointer, MD  metoprolol succinate (TOPROL-XL) 100 MG 24 hr tablet Take 50 mg by mouth 3 (three) times daily.  07/03/18  Yes [provider]  omeprazole (PRILOSEC OTC) 20 MG tablet Take 20 mg by mouth 2 (two) times daily as needed (heartburn).    Yes [provider]  oxyCODONE (OXY IR/ROXICODONE) 5 MG immediate release tablet Take 1 tablet (5 mg total) by mouth 5 (five) times daily. Must last 30 days. 04/17/19 05/17/19 Yes Milinda Pointer, MD  promethazine (PHENERGAN) 12.5 MG tablet Take 1 tablet (12.5 mg total) by mouth daily as needed for nausea or vomiting. Must last 30 days 02/08/19 08/07/19 Yes Milinda Pointer, MD  traZODone (DESYREL) 100 MG tablet Take 1 tablet (100 mg total) by mouth at bedtime as needed. Patient taking differently: Take 50-100 mg by mouth at bedtime as needed for sleep.  08/21/17  Yes Ladell Pier, MD  amLODipine (NORVASC) 10 MG tablet Take 1 tablet (10 mg total) by mouth daily for 30 days. Patient not taking: Reported on 02/11/2019 08/17/18 02/08/19  Shelly Coss, MD  oxyCODONE (OXY IR/ROXICODONE) 5 MG immediate release tablet Take 1 tablet (5 mg total) by mouth 5 (five) times daily. Must last 30 days. Patient not taking: Reported on 02/11/2019 02/16/19 03/18/19  Milinda Pointer,  MD  oxyCODONE (OXY IR/ROXICODONE) 5 MG immediate release tablet Take 1 tablet (5 mg total) by mouth 5 (five) times daily. Must last 30 days. Patient not taking: Reported on 02/11/2019 03/18/19 04/17/19  Milinda Pointer, MD  thiamine 100 MG tablet Take 1 tablet (100 mg total) by mouth daily. Patient not taking: Reported on 02/11/2019 08/18/18   Shelly Coss, MD    Physical Exam: Constitutional: Moderately built and nourished. Vitals:   02/11/19 2100 02/11/19 2130 02/11/19 2215 02/11/19 2230  BP: (!) 142/85 (!) 158/109 (!) 148/82 (!) 171/114  Pulse: 84 85 86 99  Resp: 14 14 (!) 21 15  SpO2: 95% 95% 98% 96%  Weight:       Eyes: Anicteric no pallor. ENMT: No discharge from the ears eyes nose or mouth. Neck: No mass  felt.  No neck rigidity. Respiratory: No rhonchi or crepitations. Cardiovascular: S1-S2 heard. Abdomen: Soft nontender bowel sounds present. Musculoskeletal: No edema. Skin: No rash. Neurologic: Alert awake oriented to time place and person.  Moves all extremities. Psychiatric: Appears normal but normal affect.   Labs on Admission: I have personally reviewed following labs and imaging studies  CBC: Recent Labs  Lab 02/11/19 2025  WBC 8.7  NEUTROABS 5.2  HGB 18.9*  HCT 54.3*  MCV 82.5  PLT 123XX123   Basic Metabolic Panel: Recent Labs  Lab 02/11/19 2025  NA 132*  K 3.6  CL 94*  CO2 23  GLUCOSE 117*  BUN <5*  CREATININE 0.65  CALCIUM 8.6*  MG 1.9   GFR: CrCl cannot be calculated (Unknown ideal weight.). Liver Function Tests: Recent Labs  Lab 02/11/19 2025  AST 60*  ALT 58*  ALKPHOS 106  BILITOT 0.5  PROT 6.1*  ALBUMIN 3.7   No results for input(s): LIPASE, AMYLASE in the last 168 hours. No results for input(s): AMMONIA in the last 168 hours. Coagulation Profile: No results for input(s): INR, PROTIME in the last 168 hours. Cardiac Enzymes: No results for input(s): CKTOTAL, CKMB, CKMBINDEX, TROPONINI in the last 168 hours. BNP (last 3  results) No results for input(s): PROBNP in the last 8760 hours. HbA1C: No results for input(s): HGBA1C in the last 72 hours. CBG: Recent Labs  Lab 02/11/19 2042  GLUCAP 128*   Lipid Profile: No results for input(s): CHOL, HDL, LDLCALC, TRIG, CHOLHDL, LDLDIRECT in the last 72 hours. Thyroid Function Tests: No results for input(s): TSH, T4TOTAL, FREET4, T3FREE, THYROIDAB in the last 72 hours. Anemia Panel: No results for input(s): VITAMINB12, FOLATE, FERRITIN, TIBC, IRON, RETICCTPCT in the last 72 hours. Urine analysis:    Component Value Date/Time   COLORURINE COLORLESS (A) 02/11/2019 2025   APPEARANCEUR CLEAR 02/11/2019 2025   APPEARANCEUR Clear 04/09/2017 1022   LABSPEC 1.002 (L) 02/11/2019 2025   PHURINE 6.0 02/11/2019 2025   GLUCOSEU NEGATIVE 02/11/2019 2025   HGBUR NEGATIVE 02/11/2019 2025   BILIRUBINUR NEGATIVE 02/11/2019 2025   BILIRUBINUR Negative 04/09/2017 1022   KETONESUR NEGATIVE 02/11/2019 2025   PROTEINUR NEGATIVE 02/11/2019 2025   UROBILINOGEN 0.2 09/16/2014 1708   UROBILINOGEN 1.0 01/20/2013 1900   NITRITE NEGATIVE 02/11/2019 2025   LEUKOCYTESUR NEGATIVE 02/11/2019 2025   Sepsis Labs: @LABRCNTIP (procalcitonin:4,lacticidven:4) )No results found for this or any previous visit (from the past 240 hour(s)).   Radiological Exams on Admission: Dg Chest Port 1 View  Result Date: 02/11/2019 CLINICAL DATA:  53 year old male with cough chest pain and shortness of breath. EXAM: PORTABLE CHEST 1 VIEW COMPARISON:  Chest radiographs 12/21/2017 and earlier. FINDINGS: Portable AP semi upright view at 1939 hours. Stable lung volumes. Centrilobular emphysema demonstrated by CTA in 2019. Normal cardiac size and mediastinal contours. Visualized tracheal air column is within normal limits. Prior cervical ACDF. Allowing for portable technique the lungs are clear. No pneumothorax. Negative visible bowel gas pattern. No acute osseous abnormality identified. IMPRESSION: No acute  cardiopulmonary abnormality. Emphysema (ICD10-J43.9) demonstrated by CTA in 2019. Electronically Signed   By: Genevie Ann M.D.   On: 02/11/2019 19:53    EKG: Independently reviewed.  Normal sinus rhythm with QTC of 481 ms and nonsustained V. tach.  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 Alcohol abuse with withdrawal likely from oxycodone.  On CIWA protocol.  Patient states he follows up with pain clinic for which she receives refills.  Will restart oxycodone since he has been taking it for long time.  Refills need to be obtained from his pain clinic.  Advised about quitting alcohol. 2. Nonsustained V. tach Dr. Terrence Dupont patient's cardiology was notified we will check 2D echo cardiac markers follow electrolytes closely.  Please reconsult Dr. Terrence Dupont in the morning.  Patient is on metoprolol. 3. Hypertension on metoprolol closely follow blood pressure trends. 4. Chronic pain.   DVT prophylaxis: Lovenox. Code Status: Full code. Family Communication: Discussed with patient. Disposition Plan: To be determined. Consults called: ER physician discussed with patient's cardiologist Dr. Terrence Dupont. Admission status: Observation.   Rise Patience MD [facility] Pager [phone].  If 7PM-7AM, please contact night-coverage [email or web address] Password [facility]  02/11/2019, 10:58 PM

## 2019-02-11 NOTE — ED Provider Notes (Addendum)
[facility] EMERGENCY DEPARTMENT Provider Note   CSN: [id number] Arrival date & time: 02/11/19  1832     History   Chief Complaint Chief Complaint  Patient presents with   Withdrawal    HPI Mark [name] is a 53 y.o. male right low back pain, chronic alcoholism, history of alcohol withdrawal seizures, anxiety, COPD, drug abuse, hypertension, PVCs, hepatitis.      HPI  Patient presents to the ED today with chief complaint of "withdrawal "patient states that he feels shaky, anxious, has diarrhea and nausea.  Also endorses chest pressure with some shortness of breath.  Denies any radiation of chest pain states it is non-exertional.   Patient denies history of delirium tremens but states that he has had seizures from withdrawals in the past and has been hospitalized for withdrawal.  His last alcohol was at lunch when he drank a 12 pack.  States last Percocet was 2 days ago when he ran out.  States that he is not sure how frequently he takes it because he has memory issues.  He states he is not due for refill for another week which is prescribed by pain management.   Patient states that he has had a heart catheterization done 1 to 2 years ago, by Dr. Terrence Dupont which was "clean".  States he takes metoprolol as prescribed.   Past Medical History:  Diagnosis Date   Alcohol withdrawal ([place])    Allergy    Anxiety Dx 2014   COPD (chronic obstructive pulmonary disease) ([place])    Drug abuse ([place])    pt reports opioid dependence due to previous back surgeries   Enlarged prostate    ETOH abuse    GERD (gastroesophageal reflux disease) Dx 2003   Headache(784.0)    Hepatitis    Hyperlipidemia Dx 2000   Hypertension Dx 2011   IBS (irritable bowel syndrome)    alternates constiaption/ diarrhea   Irregular heart beat    Long Q-T syndrome 01/23/2014   Mental disorder    Neuromuscular disorder ([place])    hands hurt    Neuropathy 06/07/2013   Seizure  due to alcohol withdrawal ([place]) 01/23/2014   Per patient report   Shortness of breath    Withdrawal seizures ([place]) 2014   last seizure 2014 etoh with drawal    Patient Active Problem List   Diagnosis Date Noted   Muscle spasm of back 11/09/2018   Elevated liver enzymes 11/09/2018   Hyponatremia 08/15/2018   Elevated LFTs 08/15/2018   Neurogenic pain 08/10/2018   Osteoarthritis of hip (Right) 04/07/2018   Spondylosis without myelopathy or radiculopathy, lumbar region 03/23/2018   Other specified dorsopathies, sacral and sacrococcygeal region 03/23/2018   Chronic hip pain (Right) 03/23/2018   Chronic sacroiliac joint pain (Left) 03/23/2018   Alcohol withdrawal ([place]) 12/31/2017   Dyspepsia    Opioid-induced hyperalgesia (with oxycodone) 02/11/2017   Allodynia (Lower extremities) 12/26/2016   Hyperalgesia (Lower extremities) 12/26/2016   Sympathetic pain (lower extremity) 09/26/2016   Lower extremity neuropathy (Primary Area of Pain) (Bilateral) (R>L) 09/26/2016   Lumbar facet syndrome (Bilateral) (R>L) 08/22/2016   Chronic sacroiliac joint pain (Bilateral) (R>L) 08/22/2016   Chronic lower extremity pain (Bilateral) (R>L) 06/26/2016   Opiate use 05/14/2016   Occipital headache (Bilateral) (R>L) 04/16/2016   Chronic musculoskeletal pain 04/15/2016   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[place]) 04/15/2016   Major depressive disorder, recurrent episode, moderate ([place]) 04/10/2016   Long term current use of opiate analgesic 03/18/2016  Long term prescription opiate use 03/18/2016   Chronic neck pain (Bilateral)(R>L) 03/18/2016   Cervical fusion syndrome 03/18/2016   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   Chronic knee pain  (Bilateral) (R>L) 03/18/2016   GAD (generalized anxiety disorder) 12/06/2015   Alcohol use disorder, severe, in early remission ([place]) 12/06/2015   GERD (gastroesophageal reflux disease) 07/24/2015   Chronic anxiety 12/19/2014     Erectile dysfunction 09/16/2014   Poor dentition 07/28/2014   Chronic maxillary sinusitis 07/28/2014   Vitamin D insufficiency 07/05/2014   Chronic fatigue 07/04/2014   Chronic radicular low back pain (Third area of Pain) (Right) (to calf) 06/02/2014   Chronic shoulder pain (Right) 05/12/2014   Major depressive disorder, recurrent episode, severe ([name])    Alcohol use disorder, severe, dependence ([place])    Long Q-T syndrome 01/23/2014   Hypokalemia    Opioid dependence ([place]) 01/22/2014    Class: Acute   Paroxysmal VT ([place]) 12/19/2013   Chronic low back pain (Secondary area of Pain) (Bilateral) (R>L) 12/28/2012   Chronic pain syndrome 12/28/2012   Diarrhea 12/28/2012   Alcohol intoxication ([place]) 06/09/2012   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   Nonsustained ventricular tachycardia ([place]) 01/20/2012   Tobacco abuse 01/19/2012   Essential hypertension    Hyperlipidemia     Past Surgical History:  Procedure Laterality Date   24 HOUR [place] STUDY N/A 01/16/2015   Procedure: 24 HOUR [place] STUDY;  Surgeon: Manus Gunning, MD;  Location: [facility];  Service: Gastroenterology;  Laterality: N/A;   [place] WITH CORONARY ANGIOGRAM N/A 01/13/2014   Procedure: LEFT HEART CATHETERIZATION WITH CORONARY ANGIOGRAM;  Surgeon: Clent Demark, MD;  Location: [facility];  Service: Cardiovascular;  Laterality: N/A;   NASAL SINUS SURGERY     NECK SURGERY  2000   cervial fusion    NISSEN FUNDOPLICATION     SHOULDER SURGERY Right    SUBACROMIAL DECOMPRESSION Right 11/16/2014   Procedure: RIGHT SHOULDER ARTHROSCOPY WITH SUBACROMIAL DECOMPRESSION ;  Surgeon: Leandrew Koyanagi, MD;  Location: [facility];  Service: Orthopedics;  Laterality: Right;   UPPER GASTROINTESTINAL ENDOSCOPY          Home Medications    Prior to Admission medications   Medication Sig Start  Date End Date Taking? Authorizing Provider  albuterol (VENTOLIN HFA) 108 (90 Base) MCG/ACT inhaler Inhale 2 puffs into the lungs every 6 (six) hours as needed for wheezing or shortness of breath. 08/12/17  Yes Ladell Pier, MD  baclofen (LIORESAL) 10 MG tablet Take 1 tablet (10 mg total) by mouth 3 (three) times daily. Patient taking differently: Take 10 mg by mouth 3 (three) times daily as needed for muscle spasms.  02/08/19 08/07/19 Yes Milinda Pointer, MD  famotidine (PEPCID AC) 10 MG chewable tablet Chew 10 mg by mouth as needed for heartburn.   Yes [provider]  gabapentin (NEURONTIN) 100 MG capsule Take 2 capsules (200 mg total) by mouth 3 (three) times daily. 02/08/19 08/07/19 Yes Milinda Pointer, MD  Magnesium Oxide 500 MG CAPS Take 1 capsule (500 mg total) by mouth 2 (two) times daily at 8 am and 10 pm. 02/08/19 02/08/20 Yes Milinda Pointer, MD  metoprolol succinate (TOPROL-XL) 100 MG 24 hr tablet Take 50 mg by mouth 3 (three) times daily.  07/03/18  Yes [provider]  omeprazole (PRILOSEC OTC) 20 MG tablet Take 20 mg by mouth 2 (  two) times daily as needed (heartburn).    Yes [provider]  oxyCODONE (OXY IR/ROXICODONE) 5 MG immediate release tablet Take 1 tablet (5 mg total) by mouth 5 (five) times daily. Must last 30 days. 04/17/19 05/17/19 Yes Milinda Pointer, MD  promethazine (PHENERGAN) 12.5 MG tablet Take 1 tablet (12.5 mg total) by mouth daily as needed for nausea or vomiting. Must last 30 days 02/08/19 08/07/19 Yes Milinda Pointer, MD  traZODone (DESYREL) 100 MG tablet Take 1 tablet (100 mg total) by mouth at bedtime as needed. Patient taking differently: Take 50-100 mg by mouth at bedtime as needed for sleep.  08/21/17  Yes Ladell Pier, MD  amLODipine (NORVASC) 10 MG tablet Take 1 tablet (10 mg total) by mouth daily for 30 days. Patient not taking: Reported on 02/11/2019 08/17/18 02/08/19  Shelly Coss, MD  oxyCODONE (OXY  IR/ROXICODONE) 5 MG immediate release tablet Take 1 tablet (5 mg total) by mouth 5 (five) times daily. Must last 30 days. Patient not taking: Reported on 02/11/2019 02/16/19 03/18/19  Milinda Pointer, MD  oxyCODONE (OXY IR/ROXICODONE) 5 MG immediate release tablet Take 1 tablet (5 mg total) by mouth 5 (five) times daily. Must last 30 days. Patient not taking: Reported on 02/11/2019 03/18/19 04/17/19  Milinda Pointer, MD  thiamine 100 MG tablet Take 1 tablet (100 mg total) by mouth daily. Patient not taking: Reported on 02/11/2019 08/18/18   Shelly Coss, MD    Family History Family History  Problem Relation Age of Onset   Lung cancer Father        was a smoker   Alcohol abuse Father    Hypertension Mother    Colon polyps Mother    Breast cancer Paternal Grandmother    Alcohol abuse Paternal Grandfather    Colon cancer Neg Hx    Rectal cancer Neg Hx    Stomach cancer Neg Hx     Social History Social History   Tobacco Use   Smoking status: Current Every Day Smoker    Packs/day: 0.50    Years: 30.00    Pack years: 15.00    Types: Cigarettes   Smokeless tobacco: Never Used  Substance Use Topics   Alcohol use: Yes    Alcohol/week: 0.0 standard drinks    Comment: Case a beer a day    Drug use: No    Comment: Opana     Allergies   Cozaar [losartan] and Lisinopril   Review of Systems Review of Systems  Constitutional: Positive for chills. Negative for fever.  HENT: Negative for congestion.   Respiratory: Positive for chest tightness and shortness of breath.   Cardiovascular: Positive for palpitations. Negative for leg swelling.       Chest pressure  Gastrointestinal: Positive for abdominal pain (Chronic) and diarrhea.  Neurological: Positive for headaches.  All other systems reviewed and are negative.    Physical Exam Updated Vital Signs BP (!) 171/114    Pulse 99    Resp 15    Wt 99.8 kg    SpO2 96%    BMI 26.78 kg/m   Physical Exam Vitals signs and  nursing note reviewed.  Constitutional:      General: He is not in acute distress.    Appearance: He is not diaphoretic.     Comments: No diaphoresis   HENT:     Head: Normocephalic and atraumatic.     Nose: Nose normal.  Eyes:     General: No scleral icterus.  Extraocular Movements: Extraocular movements intact.     Comments: Pupils 2-3 mm with minimal constriction to light; symmetric  Neck:     Musculoskeletal: Normal range of motion.  Cardiovascular:     Rate and Rhythm: Normal rate and regular rhythm.     Pulses: Normal pulses.     Heart sounds: Normal heart sounds.     Comments: Not tachycardic Pulmonary:     Effort: Pulmonary effort is normal. No respiratory distress.     Breath sounds: No wheezing.  Abdominal:     Palpations: Abdomen is soft.     Tenderness: There is abdominal tenderness. There is no guarding or rebound.     Comments: Diffuse tenderness to palpation. No focal tenderness. Voluntary guarding.   Musculoskeletal:     Right lower leg: No edema.     Left lower leg: No edema.  Skin:    General: Skin is warm and dry.     Capillary Refill: Capillary refill takes less than 2 seconds.  Neurological:     Mental Status: He is alert. Mental status is at baseline.     Comments: Moves all 4 limbs Sensation intact No facial droop No slurred speech Mild general tremors  Patient is anxious   Psychiatric:        Mood and Affect: Mood normal.        Behavior: Behavior normal.      ED Treatments / Results  Labs (all labs ordered are listed, but only abnormal results are displayed) Labs Reviewed  URINALYSIS, ROUTINE W REFLEX MICROSCOPIC - Abnormal; Notable for the following components:      Result Value   Color, Urine COLORLESS (*)    Specific Gravity, Urine 1.002 (*)    All other components within normal limits  ETHANOL - Abnormal; Notable for the following components:   Alcohol, Ethyl (B) 127 (*)    All other components within normal limits  CBC WITH  DIFFERENTIAL/PLATELET - Abnormal; Notable for the following components:   RBC 6.58 (*)    Hemoglobin 18.9 (*)    HCT 54.3 (*)    Abs Immature Granulocytes 0.10 (*)    All other components within normal limits  COMPREHENSIVE METABOLIC PANEL - Abnormal; Notable for the following components:   Sodium 132 (*)    Chloride 94 (*)    Glucose, Bld 117 (*)    BUN <5 (*)    Calcium 8.6 (*)    Total Protein 6.1 (*)    AST 60 (*)    ALT 58 (*)    All other components within normal limits  CBG MONITORING, ED - Abnormal; Notable for the following components:   Glucose-Capillary 128 (*)    All other components within normal limits  SARS CORONAVIRUS 2 (TAT 6-24 HRS)  RAPID URINE DRUG SCREEN, HOSP PERFORMED  BRAIN NATRIURETIC PEPTIDE  MAGNESIUM  TROPONIN I (HIGH SENSITIVITY)  TROPONIN I (HIGH SENSITIVITY)    EKG EKG Interpretation  Date/Time:  Thursday February 11 2019 21:15:32 EST Ventricular Rate:  72 PR Interval:    QRS Duration: 113 QT Interval:  439 QTC Calculation: 481 R Axis:   57 Text Interpretation: Sinus tachycardia Ventricular tachycardia, unsustained Borderline intraventricular conduction delay Borderline prolonged QT interval Confirmed by Dene Gentry [phone]) on 02/11/2019 9:17:23 PM   Radiology Dg Chest Port 1 View  Result Date: 02/11/2019 CLINICAL DATA:  53 year old male with cough chest pain and shortness of breath. EXAM: PORTABLE CHEST 1 VIEW COMPARISON:  Chest radiographs 12/21/2017 and earlier. FINDINGS:  Portable AP semi upright view at 1939 hours. Stable lung volumes. Centrilobular emphysema demonstrated by CTA in 2019. Normal cardiac size and mediastinal contours. Visualized tracheal air column is within normal limits. Prior cervical ACDF. Allowing for portable technique the lungs are clear. No pneumothorax. Negative visible bowel gas pattern. No acute osseous abnormality identified. IMPRESSION: No acute cardiopulmonary abnormality. Emphysema (ICD10-J43.9) demonstrated  by CTA in 2019. Electronically Signed   By: Genevie Ann M.D.   On: 02/11/2019 19:53    Procedures Procedures (including critical care time) CRITICAL CARE Performed by: Tedd Sias   Total critical care time: 35 minutes  Critical care time was exclusive of separately billable procedures and treating other patients.   Critical care was necessary to treat or prevent imminent or life-threatening deterioration.  Critical care was time spent personally by me on the following activities: development of treatment plan with patient and/or surrogate as well as nursing, discussions with consultants, evaluation of patient's response to treatment, examination of patient, obtaining history from patient or surrogate, ordering and performing treatments and interventions, ordering and review of laboratory studies, ordering and review of radiographic studies, pulse oximetry and re-evaluation of patient's condition.   Also episodes of nonsustained V. tach.  Consult cardiology who recommended follow-up echo after admission today.  Patient given 2 g of magnesium.   Medications Ordered in ED Medications  nicotine (NICODERM CQ - dosed in mg/24 hours) patch 21 mg (21 mg Transdermal Patch Applied 02/11/19 2032)  LORazepam (ATIVAN) injection 0-4 mg (2 mg Intravenous Given 02/11/19 2032)    Or  LORazepam (ATIVAN) tablet 0-4 mg ( Oral See Alternative 02/11/19 2032)  LORazepam (ATIVAN) injection 0-4 mg (has no administration in time range)    Or  LORazepam (ATIVAN) tablet 0-4 mg (has no administration in time range)  thiamine (VITAMIN B-1) tablet 100 mg (100 mg Oral Given 02/11/19 2032)    Or  thiamine (B-1) injection 100 mg ( Intravenous See Alternative 02/11/19 2032)  magnesium sulfate IVPB 2 g 50 mL (2 g Intravenous New Bag/Given 02/11/19 2200)  sodium chloride 0.9 % bolus 500 mL (0 mLs Intravenous Stopped 02/11/19 2117)     Initial Impression / Assessment and Plan / ED Course  I have reviewed the triage vital  signs and the nursing notes.  Pertinent labs & imaging results that were available during my care of the patient were reviewed by me and considered in my medical decision making (see chart for details).  Clinical Course as of Feb 10 2245  Thu Feb 11, 2019  2147 Notable for mild hyponatremia, potassium within normal limits  Comprehensive metabolic panel(!) [WF]  [id number] No leukocytosis, elevated hemoglobin likely due to smoking or dehydration.  CBC with Differential(!) [WF]  2149 No signs of infection  Urinalysis, Routine w reflex microscopic(!) [WF]  2150 Negative for all  Urine rapid drug screen (hosp performed) [WF]  2150 Within normal months  Brain natriuretic peptide [WF]    Clinical Course User Index [WF] Tedd Sias, [place]      Vitals:   02/11/19 2307 02/11/19 2320 02/11/19 2322 02/11/19 2345  BP: (!) 164/102  (!) 164/102 (!) 155/114  Pulse: 83  83 84  Resp:    (!) 23  Temp:  98.4 F (36.9 C)    TempSrc:  Oral    SpO2:    97%  Weight:         Patient is 53 year old male with history of alcoholism with withdrawals and withdrawal seizures,  PVCs, long QT, dilated cardiomyopathy presented today for "withdrawal symptoms "patient feels that he is having "achy feeling "feels weak, shaky, anxious, complains of chest pain and shortness of breath and palpitations for several weeks as well.   Patient does have abdominal pain and tenderness on exam however there is no focal tenderness and patient states that this is his baseline chronic abdominal pain.  He is also very nauseous with diarrhea.  Suspect this is related to opioid withdrawal.  Patient has history of chest pain shortness of breath palpitations in the past and has had echocardiography done in the past but his complaints showing his dilated cardiomyopathy.   Patient was placed on monitor, troponin, CMP, CBC, mag, BNP, UDS, UA urinalysis, alcohol level EKG x-ray ordered.   Patient with multiple episodes of PVCs triplets and  quadruplets with concern for nonsustained V. Tach.  Cardiology consulted.  Labs currently pending.  Cardiology recommends giving magnesium at this time.  Replete any electrolyte abnormalities.  Recommends that if patient is admitted would benefit from echocardiogram and they will follow.   EKG with nonsustained ventricular tachycardia 1 episodes of 2 beats 1 episode of 3 beats.  Chest x-ray with likely chronic emphysema.   Troponin initial for repeat pending at time of admission.  Other abnormal blood work discussed above.  No gross electrolyte abnormalities other than mild hyponatremia.  Patient given 500 mL NS.  Will avoid heavy fluid resuscitation as patient has dilated cardiomyopathy.   Patient given 2 mg of Ativan 500 mL normal saline given nicotine patch, thiamine.  Placed on CIWA protocol.  Continue to monitor patient closely.  I discussed this case with my attending physician who cosigned this note including patient's presenting symptoms, physical exam, and planned diagnostics and interventions. Attending physician stated agreement with plan or made changes to plan which were implemented.   Attending physician assessed patient at bedside.  COVID pending. Will consult hospitalist for admission.   10:47 PM discussion with Dr. Hal Hope who admit patient to hospital.   12:02 AM Discussed with Dr. Terrence Dupont who recommends patient follow up with him early next week after discharge.    Final Clinical Impressions(s) / ED Diagnoses   Final diagnoses:  Alcohol withdrawal syndrome with complication ([name])  Ventricular tachycardia, non-sustained ([name])  PVC (premature ventricular contraction)    ED Discharge Orders    None       Tedd Sias, [place] 02/11/19 2248    Tedd Sias, PA 02/12/19 0003    Tedd Sias, PA 02/12/19 0015    Valarie Merino, MD 02/15/19 1034

## 2019-02-12 ENCOUNTER — Inpatient Hospital Stay (HOSPITAL_COMMUNITY): Payer: Self-pay

## 2019-02-12 ENCOUNTER — Encounter (HOSPITAL_COMMUNITY): Payer: Self-pay | Admitting: General Practice

## 2019-02-12 DIAGNOSIS — R008 Other abnormalities of heart beat: Secondary | ICD-10-CM | POA: Diagnosis present

## 2019-02-12 LAB — MAGNESIUM: Magnesium: 2.4 mg/dL (ref 1.7–2.4)

## 2019-02-12 LAB — PHOSPHORUS: Phosphorus: 4 mg/dL (ref 2.5–4.6)

## 2019-02-12 LAB — CBC
HCT: 52.3 % — ABNORMAL HIGH (ref 39.0–52.0)
HCT: 53.3 % — ABNORMAL HIGH (ref 39.0–52.0)
Hemoglobin: 17.9 g/dL — ABNORMAL HIGH (ref 13.0–17.0)
Hemoglobin: 18.6 g/dL — ABNORMAL HIGH (ref 13.0–17.0)
MCH: 28.6 pg (ref 26.0–34.0)
MCH: 28.7 pg (ref 26.0–34.0)
MCHC: 34.2 g/dL (ref 30.0–36.0)
MCHC: 34.9 g/dL (ref 30.0–36.0)
MCV: 82.1 fL (ref 80.0–100.0)
MCV: 83.7 fL (ref 80.0–100.0)
Platelets: 298 10*3/uL (ref 150–400)
Platelets: 318 10*3/uL (ref 150–400)
RBC: 6.25 MIL/uL — ABNORMAL HIGH (ref 4.22–5.81)
RBC: 6.49 MIL/uL — ABNORMAL HIGH (ref 4.22–5.81)
RDW: 12.2 % (ref 11.5–15.5)
RDW: 12.3 % (ref 11.5–15.5)
WBC: 13.4 10*3/uL — ABNORMAL HIGH (ref 4.0–10.5)
WBC: 8.9 10*3/uL (ref 4.0–10.5)
nRBC: 0 % (ref 0.0–0.2)
nRBC: 0 % (ref 0.0–0.2)

## 2019-02-12 LAB — COMPREHENSIVE METABOLIC PANEL
ALT: 60 U/L — ABNORMAL HIGH (ref 0–44)
AST: 63 U/L — ABNORMAL HIGH (ref 15–41)
Albumin: 3.5 g/dL (ref 3.5–5.0)
Alkaline Phosphatase: 109 U/L (ref 38–126)
Anion gap: 15 (ref 5–15)
BUN: <5 mg/dL — ABNORMAL LOW (ref 6–20)
CO2: 22 mmol/L (ref 22–32)
Calcium: 9 mg/dL (ref 8.9–10.3)
Chloride: 96 mmol/L — ABNORMAL LOW (ref 98–111)
Creatinine, Ser: 0.69 mg/dL (ref 0.61–1.24)
GFR calc Af Amer: >60 mL/min (ref >60)
GFR calc non Af Amer: >60 mL/min (ref >60)
Glucose, Bld: 121 mg/dL — ABNORMAL HIGH (ref 70–99)
Potassium: 3.5 mmol/L (ref 3.5–5.1)
Sodium: 133 mmol/L — ABNORMAL LOW (ref 135–145)
Total Bilirubin: 0.6 mg/dL (ref 0.3–1.2)
Total Protein: 6.1 g/dL — ABNORMAL LOW (ref 6.5–8.1)

## 2019-02-12 LAB — CBC WITH DIFFERENTIAL/PLATELET
Abs Immature Granulocytes: 0.1 10*3/uL — ABNORMAL HIGH (ref 0.00–0.07)
Basophils Absolute: 0.1 10*3/uL (ref 0.0–0.1)
Basophils Relative: 1 %
Eosinophils Absolute: 0.1 10*3/uL (ref 0.0–0.5)
Eosinophils Relative: 1 %
HCT: 53.1 % — ABNORMAL HIGH (ref 39.0–52.0)
Hemoglobin: 18.5 g/dL — ABNORMAL HIGH (ref 13.0–17.0)
Immature Granulocytes: 1 %
Lymphocytes Relative: 11 %
Lymphs Abs: 1.3 10*3/uL (ref 0.7–4.0)
MCH: 28.9 pg (ref 26.0–34.0)
MCHC: 34.8 g/dL (ref 30.0–36.0)
MCV: 82.8 fL (ref 80.0–100.0)
Monocytes Absolute: 1.1 10*3/uL — ABNORMAL HIGH (ref 0.1–1.0)
Monocytes Relative: 8 %
Neutro Abs: 10 10*3/uL — ABNORMAL HIGH (ref 1.7–7.7)
Neutrophils Relative %: 78 %
Platelets: 271 10*3/uL (ref 150–400)
RBC: 6.41 MIL/uL — ABNORMAL HIGH (ref 4.22–5.81)
RDW: 12.4 % (ref 11.5–15.5)
WBC: 12.6 10*3/uL — ABNORMAL HIGH (ref 4.0–10.5)
nRBC: 0 % (ref 0.0–0.2)

## 2019-02-12 LAB — TROPONIN I (HIGH SENSITIVITY)
Troponin I (High Sensitivity): 6 ng/L (ref <18)
Troponin I (High Sensitivity): 7 ng/L (ref <18)

## 2019-02-12 LAB — ECHOCARDIOGRAM COMPLETE: Weight: 3520.31 oz

## 2019-02-12 LAB — BASIC METABOLIC PANEL
Anion gap: 16 — ABNORMAL HIGH (ref 5–15)
BUN: <5 mg/dL — ABNORMAL LOW (ref 6–20)
CO2: 21 mmol/L — ABNORMAL LOW (ref 22–32)
Calcium: 8.7 mg/dL — ABNORMAL LOW (ref 8.9–10.3)
Chloride: 96 mmol/L — ABNORMAL LOW (ref 98–111)
Creatinine, Ser: 0.82 mg/dL (ref 0.61–1.24)
GFR calc Af Amer: >60 mL/min (ref >60)
GFR calc non Af Amer: >60 mL/min (ref >60)
Glucose, Bld: 119 mg/dL — ABNORMAL HIGH (ref 70–99)
Potassium: 3.7 mmol/L (ref 3.5–5.1)
Sodium: 133 mmol/L — ABNORMAL LOW (ref 135–145)

## 2019-02-12 LAB — SARS CORONAVIRUS 2 (TAT 6-24 HRS): SARS Coronavirus 2: NEGATIVE

## 2019-02-12 LAB — HIV ANTIBODY (ROUTINE TESTING W REFLEX): HIV Screen 4th Generation wRfx: NONREACTIVE

## 2019-02-12 LAB — TSH: TSH: 2.464 u[IU]/mL (ref 0.350–4.500)

## 2019-02-12 MED ORDER — PERFLUTREN LIPID MICROSPHERE
1.0000 mL | INTRAVENOUS | Status: AC | PRN
  Administered 2019-02-12: 5 mL via INTRAVENOUS
  Filled 2019-02-12: qty 10

## 2019-02-12 NOTE — ED Notes (Signed)
Lunch Tray Ordered @ 1020.  

## 2019-02-12 NOTE — Progress Notes (Signed)
  Echocardiogram 2D Echocardiogram has been performed with [facility].  Mark [name] 02/12/2019, 10:45 AM

## 2019-02-12 NOTE — Progress Notes (Addendum)
Hospitalist daily note   Mark [name] [id number] DOB: Jul 19, 1965 DOA: 02/11/2019  PCP: Ladell Pier, MD   Narrative:  80 white male multiple back shoulder  neck surgeries + chronic pain chronic ethanolism, seizures, smoker, HTN 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 Terrence Dupont cardiologist consulted by ED recommending give magnesium (presumably to prevent torsades) and follow-up outpatient EtOH level 127   Data Reviewed:  EtOH level 127 CO2 21 BUN/creatinine 5/0.8-magnesium upto 2.4 WBC 13.4 hemoglobin 17 CIWA ranging 3-11 Assessment & Plan: EtOH withdrawal-CIWA protocol ranging 3-11 currently-we will get scheduled opiates and if objectively needs Ativan can be given per telemetry protocol  Chronic pain secondary to above surgery-continue home meds OxyIR 5 mg 5 times a day-patient understands only 1 day supply can be given if discharged home weekend-understands needs to call Dr. [name] pain physician for refills-continue gabapentin 200 iii/day  NSVT keep on monitors-noted around some trigeminy earlier this morning -continue metoprolol 50 XL 3 times daily-magnesium 1 g given-we will resume Mag-Ox 400 ID-.-if worsening will need input from cardiology--- would hold trazodone presumably given for Insomnia Hx scan secondary to risk of prolonged QTC  Mild metabolic acidosis-likely secondary to EtOH  Leukocytosis 13-no fever no chills could be secondary to acute withdrawal state and demargination from stress-he is hemoconcentrated because of his smoking and alcohol use  Smoker-nicotine patch ordered as as needed   Chart review  Hospitalized 6/6 through 08/17/2018 for an ethanol withdrawal  Left [facility] hospitalization  01/02/2018 ethanol withdrawal  Admitted 11/10/2015 ethanol withdrawal  Based on discharge summaries 01/2014 patient was requesting opiates from multiple providers in the past   Subjective: Awake coherent slightly tremulous but no distress Consultants:   None Procedures:   No Antimicrobials:   None   Objective: Vitals:   02/12/19 0346 02/12/19 0440 02/12/19 0500 02/12/19 0600  BP: (!) 155/101 138/86 (!) 146/83 (!) 160/94  Pulse: 87 (!) 128 82 82  Resp:  (!) 21 (!) 23 (!) 23  Temp:      TempSrc:      SpO2:  99% 95% 93%  Weight:        Intake/Output Summary (Last 24 hours) at 02/12/2019 0818 Last data filed at 02/11/2019 2350 Gross per 24 hour  Intake 500 ml  Output 1100 ml  Net -600 ml   Filed Weights   02/11/19 1903  Weight: 99.8 kg    Examination: Awake alert but somewhat tremulous coherent making sense understands the plan No fever no chest pain some headaches S1-S2 NSVT ectopic beats otherwise RRR Abdomen soft nontender  No lower extremity edema Neurologically intact and coherent  Scheduled Meds: . enoxaparin (LOVENOX) injection  40 mg Subcutaneous Q24H  . folic acid  1 mg Oral Daily  . gabapentin  200 mg Oral TID  . magnesium oxide  400 mg Oral BID  . metoprolol succinate  50 mg Oral TID  . multivitamin with minerals  1 tablet Oral Daily  . thiamine  100 mg Oral Daily   Or  . thiamine  100 mg Intravenous Daily   Continuous Infusions:   LOS: 0 days   Time spent: [place], MD [facility]

## 2019-02-12 NOTE — Progress Notes (Signed)
NEW ADMISSION NOTE New Admission Note: Patient transferred from ED  Arrival Method: patient arrived on stretcher accompanied by the staff. Mental Orientation: Alert and oriented x 4 Telemetry: 50M-09, NSR Assessment: Completed Skin: warm dry and intact, Right great toe callus IV:  Left FA SL Pain: 8/10, administered Oxycodone 5 mg. Tubes:  N/A Safety Measures: Safety Fall Prevention Plan has been given, discussed and signed, pt refusing bed alarm. Admission: Completed [facility] [place] Orientation: Patient has been orientated to the room, unit and staff.    Orders have been reviewed and implemented. Will continue to monitor the patient. Call light has been placed within reach and bed alarm has been activated.   Amaryllis Dyke, RN

## 2019-02-12 NOTE — ED Notes (Signed)
Ordered breakfast--Mark [name] 

## 2019-02-13 LAB — COMPREHENSIVE METABOLIC PANEL
ALT: 41 U/L (ref 0–44)
ALT: 34 U/L (ref 0–44)
AST: 26 U/L (ref 15–41)
AST: 21 U/L (ref 15–41)
Albumin: 3.3 g/dL — ABNORMAL LOW (ref 3.5–5.0)
Albumin: 3.4 g/dL — ABNORMAL LOW (ref 3.5–5.0)
Alkaline Phosphatase: 120 U/L (ref 38–126)
Alkaline Phosphatase: 118 U/L (ref 38–126)
Anion gap: 12 (ref 5–15)
Anion gap: 11 (ref 5–15)
BUN: <5 mg/dL — ABNORMAL LOW (ref 6–20)
BUN: <5 mg/dL — ABNORMAL LOW (ref 6–20)
CO2: 29 mmol/L (ref 22–32)
CO2: 25 mmol/L (ref 22–32)
Calcium: 9.1 mg/dL (ref 8.9–10.3)
Calcium: 9.4 mg/dL (ref 8.9–10.3)
Chloride: 97 mmol/L — ABNORMAL LOW (ref 98–111)
Chloride: 96 mmol/L — ABNORMAL LOW (ref 98–111)
Creatinine, Ser: 0.82 mg/dL (ref 0.61–1.24)
Creatinine, Ser: 0.66 mg/dL (ref 0.61–1.24)
GFR calc Af Amer: >60 mL/min (ref >60)
GFR calc Af Amer: >60 mL/min (ref >60)
GFR calc non Af Amer: >60 mL/min (ref >60)
GFR calc non Af Amer: >60 mL/min (ref >60)
Glucose, Bld: 115 mg/dL — ABNORMAL HIGH (ref 70–99)
Glucose, Bld: 115 mg/dL — ABNORMAL HIGH (ref 70–99)
Potassium: 3.9 mmol/L (ref 3.5–5.1)
Potassium: 3.9 mmol/L (ref 3.5–5.1)
Sodium: 138 mmol/L (ref 135–145)
Sodium: 132 mmol/L — ABNORMAL LOW (ref 135–145)
Total Bilirubin: 0.6 mg/dL (ref 0.3–1.2)
Total Bilirubin: 0.4 mg/dL (ref 0.3–1.2)
Total Protein: 5.7 g/dL — ABNORMAL LOW (ref 6.5–8.1)
Total Protein: 6 g/dL — ABNORMAL LOW (ref 6.5–8.1)

## 2019-02-13 LAB — MAGNESIUM: Magnesium: 2.2 mg/dL (ref 1.7–2.4)

## 2019-02-13 MED ORDER — LORAZEPAM 2 MG/ML IJ SOLN: 1.0000 mg | INTRAMUSCULAR | Status: DC

## 2019-02-13 MED ORDER — BACLOFEN 10 MG PO TABS: 10.0000 mg | ORAL_TABLET | Freq: Three times a day (TID) | ORAL | Status: DC | PRN

## 2019-02-13 MED ORDER — TRAZODONE HCL 50 MG PO TABS
50.0000 mg | ORAL_TABLET | Freq: Every evening | ORAL | Status: DC | PRN
  Administered 2019-02-13: 100 mg via ORAL
  Filled 2019-02-13: qty 2

## 2019-02-13 MED ORDER — LORAZEPAM 1 MG PO TABS
1.0000 mg | ORAL_TABLET | ORAL | Status: DC
  Administered 2019-02-13 – 2019-02-14 (×2): 2 mg via ORAL
  Filled 2019-02-13 (×2): qty 2

## 2019-02-13 NOTE — Progress Notes (Signed)
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 Terrence Dupont cardiologist consulted by ED recommending give magnesium (presumably to prevent torsades) and follow-up outpatient EtOH level 127   Data Reviewed:  CO2 21 BUN/creatinine 5/0.8-magnesium upto 2.4 WBC 13.4 hemoglobin 17 CIWA ranging 13-16 MAg 2.2 Echo 12/5 50-55% nbo LVH Assessment & Plan: EtOH withdrawal -CIWA protocol ranging 14 currently -we will get scheduled opiates --continue Ativan protoocl  Chronic pain secondary to above surgery- continue home meds OxyIR 5 mg 5 times a day-patient understands only 1 day supply can be given if discharged home weekend-understands needs to call Dr. [name] pain physician for refills continue gabapentin 200 iii/day--restarted Baclofen-resumed trazodone  NSVT keep on monitors noted around some trigeminy earlier this morning again-Mag wnl continue metoprolol 50 XL 3 times daily-magnesium 1 g given-we will resume Mag-Ox 400 ID-.-if worsening will need input from cardiology---   Insomnia Hx prolonged QTC would hold trazodone presumably given for  Mild metabolic acidosis-likely secondary to EtOH  Leukocytosis [facility]  Smoker-nicotine patch ordered as as needed   Chart review  Hospitalized 6/6 through 08/17/2018 for an ethanol withdrawal  Left AMA hospitalization 01/02/2018 ethanol withdrawal  Admitted 11/10/2015 ethanol withdrawal  Based on discharge summaries 01/2014 patient was  requesting opiates from multiple providers in the past   Subjective: No new issue seating drinking asking to ambulate no focal deficit   Consultants:   None Procedures:   No Antimicrobials:   None   Objective: Vitals:   02/12/19 1739 02/12/19 2200 02/13/19 0500 02/13/19 0909  BP: (!) 156/86 (!) 155/87 (!) 152/83 (!) 151/91  Pulse: 89 97 99 70  Resp:  18 18 18   Temp:  98.2 F (36.8 C) 98.4 F (36.9 C) 98.6 F (37 C)  TempSrc:  Oral Oral Oral  SpO2:  95% 96% 96%  Weight:  99.7 kg    Height:        Intake/Output Summary (Last 24 hours) at 02/13/2019 1117 Last data filed at 02/13/2019 0900 Gross per 24 hour  Intake 540 ml  Output 0 ml  Net 540 ml   Filed Weights   02/11/19 1903 02/12/19 1625 02/12/19 2200  Weight: 99.8 kg 98.5 kg 99.7 kg    Examination: Awake alert  S1-S2 NSVT ectopic beats with some rigeminiy on monitor this ame RRR Abdomen soft nontender  no Neurologically intact and coherent  Scheduled Meds: . enoxaparin (LOVENOX) injection  40 mg Subcutaneous Q24H  . folic acid  1 mg Oral Daily  . gabapentin  200 mg Oral TID  . magnesium oxide  400 mg Oral BID  . metoprolol succinate  50 mg Oral TID  . multivitamin with minerals  1 tablet Oral Daily  . thiamine  100 mg Oral Daily   Or  . thiamine  100 mg Intravenous Daily   Continuous Infusions:   LOS: 1 day   Time spent: [place], MD [facility]

## 2019-02-13 NOTE — Plan of Care (Signed)
  Problem: Education: Goal: Knowledge of General Education information will improve Description Including pain rating scale, medication(s)/side effects and non-pharmacologic comfort measures Outcome: Progressing   

## 2019-02-13 NOTE — Progress Notes (Signed)
Paged MD to change patient from qh CIWA to [place]. Waiting to hear back from MD.   Farley Ly. RN

## 2019-02-14 LAB — RENAL FUNCTION PANEL
Albumin: 3.2 g/dL — ABNORMAL LOW (ref 3.5–5.0)
Anion gap: 11 (ref 5–15)
BUN: 6 mg/dL (ref 6–20)
CO2: 28 mmol/L (ref 22–32)
Calcium: 9.2 mg/dL (ref 8.9–10.3)
Chloride: 97 mmol/L — ABNORMAL LOW (ref 98–111)
Creatinine, Ser: 0.98 mg/dL (ref 0.61–1.24)
GFR calc Af Amer: >60 mL/min (ref >60)
GFR calc non Af Amer: >60 mL/min (ref >60)
Glucose, Bld: 186 mg/dL — ABNORMAL HIGH (ref 70–99)
Phosphorus: 4.5 mg/dL (ref 2.5–4.6)
Potassium: 3.8 mmol/L (ref 3.5–5.1)
Sodium: 136 mmol/L (ref 135–145)

## 2019-02-14 LAB — CBC WITH DIFFERENTIAL/PLATELET
Abs Immature Granulocytes: 0.1 10*3/uL — ABNORMAL HIGH (ref 0.00–0.07)
Basophils Absolute: 0.1 10*3/uL (ref 0.0–0.1)
Basophils Relative: 1 %
Eosinophils Absolute: 0.4 10*3/uL (ref 0.0–0.5)
Eosinophils Relative: 4 %
HCT: 51.7 % (ref 39.0–52.0)
Hemoglobin: 17.5 g/dL — ABNORMAL HIGH (ref 13.0–17.0)
Immature Granulocytes: 1 %
Lymphocytes Relative: 21 %
Lymphs Abs: 2.2 10*3/uL (ref 0.7–4.0)
MCH: 28.8 pg (ref 26.0–34.0)
MCHC: 33.8 g/dL (ref 30.0–36.0)
MCV: 85.2 fL (ref 80.0–100.0)
Monocytes Absolute: 0.7 10*3/uL (ref 0.1–1.0)
Monocytes Relative: 6 %
Neutro Abs: 7.1 10*3/uL (ref 1.7–7.7)
Neutrophils Relative %: 67 %
Platelets: 263 10*3/uL (ref 150–400)
RBC: 6.07 MIL/uL — ABNORMAL HIGH (ref 4.22–5.81)
RDW: 12.6 % (ref 11.5–15.5)
WBC: 10.6 10*3/uL — ABNORMAL HIGH (ref 4.0–10.5)
nRBC: 0 % (ref 0.0–0.2)

## 2019-02-14 MED ORDER — OXYCODONE HCL 5 MG PO TABS: 5.0000 mg | ORAL_TABLET | Freq: Every day | ORAL | 0 refills | Status: DC

## 2019-02-14 NOTE — Progress Notes (Signed)
Mark [name] to be discharged home per MD order. Discussed prescriptions and follow up appointments with the patient. Prescriptions given to patient; medication list explained in detail. Patient verbalized understanding.  Skin clean, dry and intact without evidence of skin break down, no evidence of skin tears noted. IV catheter discontinued intact. Site without signs and symptoms of complications. Dressing and pressure applied. Pt denies pain at the site currently. No complaints noted.  Patient free of lines, drains, and wounds.   An After Visit Summary (AVS) was printed and given to the patient. Patient escorted via wheelchair, and discharged home via private auto.  [name], RN

## 2019-02-14 NOTE — Plan of Care (Signed)

## 2019-02-14 NOTE — Discharge Summary (Signed)
Physician Discharge Summary  Mark [name] [id number] DOB: 1966/03/06 DOA: 02/11/2019  PCP: Mark Pier, MD  Admit date: 02/11/2019 Discharge date: 02/14/2019  Time spent: 35 minutes  Recommendations for Outpatient Follow-up:  1. Very limited prescription of opiates given for the next 2 days until he can see his pain physician Dr. Consuela [name] 2. Outpatient referral to psychiatry regarding anxiety etc.  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 ([name])   Paroxysmal VT ([name])   Alcohol use disorder, severe, dependence ([name])   Alcohol use disorder, severe, in early remission ([name])   Trigeminy   Discharge Condition: Improved  Diet recommendation: Heart healthy  Filed Weights   02/12/19 1625 02/12/19 2200 02/14/19 0423  Weight: 98.5 kg 99.7 kg 97.9 kg    History of present illness:  4 white male multiple back shoulder  neck surgeries + chronic pain chronic ethanolism, seizures, smoker, HTN 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 Terrence [name] cardiologist consulted by ED recommending give magnesium (presumably to prevent torsades) and follow-up outpatient EtOH level [place] [facility] Course:  EtOH withdrawal -CIWA protocol ranging 14 currently -we will get scheduled opiates --continue Ativan protoocl  Chronic pain secondary to above surgery- continue home meds OxyIR 5 mg 5 times a day-patient understands only 1 day supply can be given if discharged home weekend-understands needs to call Dr. Hope [name] pain physician for refills continue gabapentin 200 iii/day--restarted  Baclofen-resumed trazodone  NSVT keep on monitors noted around some trigeminy with ambulation which is normal for him continue metoprolol 50 XL 3 times daily-magnesium 1 g given-resumed home doses as an outpatient I discussed briefly informally with Dr. Terrence [name] on 12/5 and he said no other work-up is needed  Insomnia Hx prolonged QTC Initially held trazodone however resumed on discharge  Mild metabolic acidosis-likely secondary to EtOH and resolved  Leukocytosis-resolved on discharge no fever no chills came down to 10   Smoker-nicotine patch ordered as as needed   Chart review  Hospitalized 6/6 through 08/17/2018 for an ethanol withdrawal  Left AMA hospitalization 01/02/2018 ethanol withdrawal  Admitted 11/10/2015 ethanol withdrawal  Based on discharge summaries 01/2014 patient was requesting opiates from multiple providers in the past  Procedures:  Echocardiogram 02/13/2019 EF 50-55% no LVH   Consultations:  None  Discharge Exam: Vitals:   02/13/19 2149 02/14/19 0423  BP:  (!) 139/94  Pulse:  80  Resp: 20 16  Temp:  98.2 F (36.8 C)  SpO2:  93%    General: Awake alert coherent no distress EOMI NCAT Cardiovascular: S1-S2 no murmur rub or gallop Respiratory: Clinically clear no added sound no rales no rhonchi Abdomen soft nontender no rebound no guarding no focal deficit Was up and about ambulating and seems to have showered overnight  Discharge Instructions   Discharge Instructions    Diet - low sodium heart healthy   Complete by: As directed    Discharge instructions   Complete by: As directed    Follow with your pain MD for refills please  try not to drink  please ensure follow up with primary MD for labs etc   Increase activity slowly   Complete by: As  directed      Allergies as of 02/14/2019      Reactions   Cozaar [losartan] Anaphylaxis   Lisinopril Anaphylaxis      Medication List    STOP taking these medications   promethazine 12.5  MG tablet Commonly known as: PHENERGAN     TAKE these medications   albuterol 108 (90 Base) MCG/ACT inhaler Commonly known as: Ventolin HFA Inhale 2 puffs into the lungs every 6 (six) hours as needed for wheezing or shortness of breath.   amLODipine 10 MG tablet Commonly known as: NORVASC Take 1 tablet (10 mg total) by mouth daily for 30 days.   baclofen 10 MG tablet Commonly known as: LIORESAL Take 1 tablet (10 mg total) by mouth 3 (three) times daily. What changed:   when to take this  reasons to take this   famotidine 10 MG chewable tablet Commonly known as: PEPCID AC Chew 10 mg by mouth as needed for heartburn.   gabapentin 100 MG capsule Commonly known as: Neurontin Take 2 capsules (200 mg total) by mouth 3 (three) times daily.   Magnesium Oxide 500 MG Caps Take 1 capsule (500 mg total) by mouth 2 (two) times daily at 8 am and 10 pm.   metoprolol succinate 100 MG 24 hr tablet Commonly known as: TOPROL-XL Take 50 mg by mouth 3 (three) times daily.   oxyCODONE 5 MG immediate release tablet Commonly known as: Oxy IR/ROXICODONE Take 1 tablet (5 mg total) by mouth 5 (five) times daily for 10 doses. Start taking on: February 16, 2019 What changed:   additional instructions  These instructions start on February 16, 2019. If you are unsure what to do until then, ask your doctor or other care provider.  Another medication with the same name was removed. Continue taking this medication, and follow the directions you see here.   PriLOSEC OTC 20 MG tablet Generic drug: omeprazole Take 20 mg by mouth 2 (two) times daily as needed (heartburn).   thiamine 100 MG tablet Take 1 tablet (100 mg total) by mouth daily.   traZODone 100 MG tablet Commonly known as: DESYREL Take 1 tablet (100 mg total) by mouth at bedtime as needed. What changed:   how much to take  reasons to take this      Allergies  Allergen Reactions  . Cozaar [Losartan] Anaphylaxis  . Lisinopril  Anaphylaxis      The results of significant diagnostics from this hospitalization (including imaging, microbiology, ancillary and laboratory) are listed below for reference.    Significant Diagnostic Studies: Dg Chest Port 1 View  Result Date: 02/11/2019 CLINICAL DATA:  53 year old male with cough chest pain and shortness of breath. EXAM: PORTABLE CHEST 1 VIEW COMPARISON:  Chest radiographs 12/21/2017 and earlier. FINDINGS: Portable AP semi upright view at 1939 hours. Stable lung volumes. Centrilobular emphysema demonstrated by CTA in 2019. Normal cardiac size and mediastinal contours. Visualized tracheal air column is within normal limits. Prior cervical ACDF. Allowing for portable technique the lungs are clear. No pneumothorax. Negative visible bowel gas pattern. No acute osseous abnormality identified. IMPRESSION: No acute cardiopulmonary abnormality. Emphysema (ICD10-J43.9) demonstrated by CTA in 2019. Electronically Signed   By: Genevie Ann M.D.   On: 02/11/2019 19:53    Microbiology: Recent Results (from the past 240 hour(s))  SARS CORONAVIRUS 2 (TAT 6-24 HRS) Nasopharyngeal Nasopharyngeal Swab     Status: None   Collection Time: 02/11/19  8:40 PM   Specimen: Nasopharyngeal Swab  Result Value Ref  Range Status   SARS Coronavirus 2 NEGATIVE NEGATIVE Final    Comment: (NOTE) SARS-CoV-2 target nucleic acids are NOT DETECTED. The SARS-CoV-2 RNA is generally detectable in upper and lower respiratory specimens during the acute phase of infection. Negative results do not preclude SARS-CoV-2 infection, do not rule out co-infections with other pathogens, and should not be used as the sole basis for treatment or other patient management decisions. Negative results must be combined with clinical observations, patient history, and epidemiological information. The expected result is Negative. Fact Sheet for Patients: SugarRoll.be Fact Sheet for Healthcare  Providers: https://www.woods-mathews.com/ This test is not yet approved or cleared by the [place] FDA and  has been authorized for detection and/or diagnosis of SARS-CoV-2 by FDA under an Emergency Use Authorization (EUA). This EUA will remain  in effect (meaning this test can be used) for the duration of the COVID-19 declaration under Section 56 4(b)(1) of the Act, 21 U.S.C. section 360bbb-3(b)(1), unless the authorization is terminated or revoked sooner. Performed at [facility], [place] [address] [place], [place] [address]      Labs: Basic Metabolic Panel: Recent Labs  Lab 02/11/19 2025 02/11/19 2358 02/12/19 0442 02/13/19 0614 02/13/19 2104 02/14/19 0559  NA 132* 133* 133* 138 132* 136  K 3.6 3.5 3.7 3.9 3.9 3.8  CL 94* 96* 96* 97* 96* 97*  CO2 23 22 21* 29 25 28   GLUCOSE 117* 121* 119* 115* 115* 186*  BUN <5* <5* <5* <5* <5* 6  CREATININE 0.65 0.69 0.82 0.82 0.66 0.98  CALCIUM 8.6* 9.0 8.7* 9.1 9.4 9.2  MG 1.9 2.4  --  2.2  --   --   PHOS  --  4.0  --   --   --  4.5   Liver Function Tests: Recent Labs  Lab 02/11/19 2025 02/11/19 2358 02/13/19 0614 02/13/19 2104 02/14/19 0559  AST 60* 63* 26 21  --   ALT 58* 60* 41 34  --   ALKPHOS 106 109 120 118  --   BILITOT 0.5 0.6 0.6 0.4  --   PROT 6.1* 6.1* 5.7* 6.0*  --   ALBUMIN 3.7 3.5 3.3* 3.4* 3.2*   No results for input(s): LIPASE, AMYLASE in the last 168 hours. No results for input(s): AMMONIA in the last 168 hours. CBC: Recent Labs  Lab 02/11/19 2025 02/11/19 2358 02/12/19 0442 02/12/19 1040 02/14/19 0559  WBC 8.7 8.9 13.4* 12.6* 10.6*  NEUTROABS 5.2  --   --  10.0* 7.1  HGB 18.9* 18.6* 17.9* 18.5* 17.5*  HCT 54.3* 53.3* 52.3* 53.1* 51.7  MCV 82.5 82.1 83.7 82.8 85.2  PLT 316 318 298 271 263   Cardiac Enzymes: No results for input(s): CKTOTAL, CKMB, CKMBINDEX, TROPONINI in the last 168 hours. BNP: BNP (last 3 results) Recent Labs    02/11/19 2025  BNP 64.9    ProBNP  (last 3 results) No results for input(s): PROBNP in the last 8760 hours.  CBG: Recent Labs  Lab 02/11/19 2042  GLUCAP 128*       Signed:  Nita Sells MD   [facility] 02/14/2019, 8:24 AM

## 2019-02-15 MED FILL — METOPROLOL TARTRATE 50 MG T: 50 | 30 days supply | Qty: 90 | Fill #2

## 2019-02-16 MED FILL — oxyCODONE HCL 5 MG TABS: 5 | 30 days supply | Qty: 150 | Fill #0

## 2019-03-15 ENCOUNTER — Other Ambulatory Visit: Payer: Self-pay | Admitting: Pain Medicine

## 2019-03-15 DIAGNOSIS — R110 Nausea: Secondary | ICD-10-CM

## 2019-03-15 MED FILL — METOPROLOL TARTRATE 50 MG T: 50 | 30 days supply | Qty: 90 | Fill #3

## 2019-03-15 MED FILL — GABAPENTIN 100 MG CAPSULE: 100 | 30 days supply | Qty: 180 | Fill #1

## 2019-03-18 MED FILL — oxyCODONE HCL 5 MG TABS: 5 | 30 days supply | Qty: 150 | Fill #0

## 2019-04-11 ENCOUNTER — Emergency Department (HOSPITAL_COMMUNITY)
Admission: EM | Admit: 2019-04-11 | Discharge: 2019-04-11 | Disposition: A | Discharge status: Home / Self Care | Payer: Self-pay | Attending: Emergency Medicine | Admitting: Emergency Medicine

## 2019-04-11 ENCOUNTER — Emergency Department (HOSPITAL_COMMUNITY): Payer: Self-pay

## 2019-04-11 DIAGNOSIS — F17210 Nicotine dependence, cigarettes, uncomplicated: Secondary | ICD-10-CM | POA: Insufficient documentation

## 2019-04-11 DIAGNOSIS — J449 Chronic obstructive pulmonary disease, unspecified: Secondary | ICD-10-CM | POA: Insufficient documentation

## 2019-04-11 DIAGNOSIS — G8929 Other chronic pain: Secondary | ICD-10-CM | POA: Insufficient documentation

## 2019-04-11 DIAGNOSIS — I10 Essential (primary) hypertension: Secondary | ICD-10-CM | POA: Insufficient documentation

## 2019-04-11 DIAGNOSIS — F3131 Bipolar disorder, current episode depressed, mild: Secondary | ICD-10-CM | POA: Insufficient documentation

## 2019-04-11 DIAGNOSIS — F1020 Alcohol dependence, uncomplicated: Secondary | ICD-10-CM | POA: Insufficient documentation

## 2019-04-11 DIAGNOSIS — Z79899 Other long term (current) drug therapy: Secondary | ICD-10-CM | POA: Insufficient documentation

## 2019-04-11 DIAGNOSIS — R0789 Other chest pain: Secondary | ICD-10-CM | POA: Insufficient documentation

## 2019-04-11 LAB — CBG MONITORING, ED: Glucose-Capillary: 142 mg/dL — ABNORMAL HIGH (ref 70–99)

## 2019-04-11 LAB — RAPID URINE DRUG SCREEN, HOSP PERFORMED
Amphetamines: NOT DETECTED
Barbiturates: NOT DETECTED
Benzodiazepines: NOT DETECTED
Cocaine: NOT DETECTED
Opiates: NOT DETECTED
Tetrahydrocannabinol: NOT DETECTED

## 2019-04-11 LAB — CBC WITH DIFFERENTIAL/PLATELET
Abs Immature Granulocytes: 0.04 10*3/uL (ref 0.00–0.07)
Basophils Absolute: 0.1 10*3/uL (ref 0.0–0.1)
Basophils Relative: 1 %
Eosinophils Absolute: 0 10*3/uL (ref 0.0–0.5)
Eosinophils Relative: 0 %
HCT: 57.9 % — ABNORMAL HIGH (ref 39.0–52.0)
Hemoglobin: 20 g/dL — ABNORMAL HIGH (ref 13.0–17.0)
Immature Granulocytes: 0 %
Lymphocytes Relative: 30 %
Lymphs Abs: 2.7 10*3/uL (ref 0.7–4.0)
MCH: 28.9 pg (ref 26.0–34.0)
MCHC: 34.5 g/dL (ref 30.0–36.0)
MCV: 83.5 fL (ref 80.0–100.0)
Monocytes Absolute: 0.5 10*3/uL (ref 0.1–1.0)
Monocytes Relative: 5 %
Neutro Abs: 5.7 10*3/uL (ref 1.7–7.7)
Neutrophils Relative %: 64 %
Platelets: 367 10*3/uL (ref 150–400)
RBC: 6.93 MIL/uL — ABNORMAL HIGH (ref 4.22–5.81)
RDW: 14.3 % (ref 11.5–15.5)
WBC: 9 10*3/uL (ref 4.0–10.5)
nRBC: 0 % (ref 0.0–0.2)

## 2019-04-11 LAB — URINALYSIS, ROUTINE W REFLEX MICROSCOPIC
Bilirubin Urine: NEGATIVE
Glucose, UA: NEGATIVE mg/dL
Hgb urine dipstick: NEGATIVE
Ketones, ur: NEGATIVE mg/dL
Leukocytes,Ua: NEGATIVE
Nitrite: NEGATIVE
Protein, ur: NEGATIVE mg/dL
Specific Gravity, Urine: 1.002 — ABNORMAL LOW (ref 1.005–1.030)
pH: 6 (ref 5.0–8.0)

## 2019-04-11 LAB — BASIC METABOLIC PANEL
Anion gap: 20 — ABNORMAL HIGH (ref 5–15)
BUN: <5 mg/dL — ABNORMAL LOW (ref 6–20)
CO2: 15 mmol/L — ABNORMAL LOW (ref 22–32)
Calcium: 9 mg/dL (ref 8.9–10.3)
Chloride: 97 mmol/L — ABNORMAL LOW (ref 98–111)
Creatinine, Ser: 0.71 mg/dL (ref 0.61–1.24)
GFR calc Af Amer: >60 mL/min (ref >60)
GFR calc non Af Amer: >60 mL/min (ref >60)
Glucose, Bld: 122 mg/dL — ABNORMAL HIGH (ref 70–99)
Potassium: 4 mmol/L (ref 3.5–5.1)
Sodium: 132 mmol/L — ABNORMAL LOW (ref 135–145)

## 2019-04-11 LAB — TROPONIN I (HIGH SENSITIVITY)
Troponin I (High Sensitivity): 4 ng/L (ref <18)
Troponin I (High Sensitivity): 4 ng/L (ref <18)

## 2019-04-11 LAB — BRAIN NATRIURETIC PEPTIDE: B Natriuretic Peptide: 133.3 pg/mL — ABNORMAL HIGH (ref 0.0–100.0)

## 2019-04-11 MED ORDER — CHLORDIAZEPOXIDE HCL 25 MG PO CAPS: ORAL_CAPSULE | ORAL | 0 refills | Status: DC

## 2019-04-11 MED ORDER — HYDROMORPHONE HCL 1 MG/ML IJ SOLN
1.0000 mg | Freq: Once | INTRAMUSCULAR | Status: AC
  Administered 2019-04-11: 1 mg via INTRAVENOUS
  Filled 2019-04-11: qty 1

## 2019-04-11 NOTE — Progress Notes (Signed)
TTS faxed resources to [phone].

## 2019-04-11 NOTE — Progress Notes (Signed)
TTS calling telepsych cart, no answer

## 2019-04-11 NOTE — BH Assessment (Signed)
Tele Assessment Note   Patient Name: Mark [name] MRN: [id number] Referring Physician: Lucrezia Starch, MD Location of Patient: [facility] Location of Provider: [facility]  Mark [name] is an 54 y.o. male who presents to the ED voluntarily. Pt reports he initially came to the ED due to chest pain. Pt states he is in chronic pain and has been for the past 20 years. Pt states he takes medication that is not helpful. TTS asked the pt if he has experienced SI and he states "I don't know how to answer that." Pt has a hx of suicide attempts per chart review and was admitted to [name] in 2019 after he cut his throat. Pt states he has thoughts of not wanting to be in pain but does not answer if he is suicidal. Pt is crying during the assessment and states his divorce is also part of his stressor. Pt states he got divorced 7 years ago and he has never dated anyone else. Pt states he used to see a [place] provider for depression but he has not seen one recently. Pt states "I would like to have a therapist. It would be nice to have someone to talk to." Pt does not provide consent for TTS to speak with any collateral contacts at this time. Pt states he has no income aside from EBT for food. Pt endorses depressive thoughts, difficulty sleeping, low mood, and hopeless feelings.   Per [name], NP pt does not meet criteria for inpt tx and recommends pt follow up with OPT [place] providers. TTS will fax OPT resources for ongoing mental health needs.   Diagnosis: Bipolar d/o, current episode depressed; Alcohol use d/o, moderate  Past Medical History:  Past Medical History:  Diagnosis Date  . Alcohol withdrawal ([place])   . Allergy   . Anxiety Dx 2014  . COPD (chronic obstructive pulmonary disease) ([place])   . Drug abuse ([place])    pt reports opioid dependence due to previous back surgeries  . Enlarged prostate   . ETOH abuse   . GERD (gastroesophageal reflux disease) Dx 2003  . Headache(784.0)    . Hepatitis   . Hyperlipidemia Dx 2000  . Hypertension Dx 2011  . IBS (irritable bowel syndrome)    alternates constiaption/ diarrhea  . Irregular heart beat   . 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 Mental disorder   . Neuromuscular disorder ([place])    hands hurt   . Neuropathy 06/07/2013  . Seizure due to alcohol withdrawal ([place]) 01/23/2014   Per patient report  . Shortness of breath   . Withdrawal seizures ([place]) 2014   last seizure 2014 etoh with drawal    Past Surgical History:  Procedure Laterality Date  . [place] STUDY N/A 01/16/2015   Procedure: [place] STUDY;  Surgeon: Manus Gunning, MD;  Location: [facility];  Service: Gastroenterology;  Laterality: N/A;  . [place]  . COLONOSCOPY  1998  . LEFT HEART CATHETERIZATION WITH CORONARY ANGIOGRAM N/A 01/13/2014   Procedure: LEFT HEART CATHETERIZATION WITH CORONARY ANGIOGRAM;  Surgeon: Clent Demark, MD;  Location: [facility];  Service: Cardiovascular;  Laterality: N/A;  . NASAL SINUS SURGERY    . NECK SURGERY  2000   cervial fusion   . NISSEN FUNDOPLICATION    . SHOULDER SURGERY Right   . SUBACROMIAL DECOMPRESSION Right 11/16/2014   Procedure: RIGHT SHOULDER ARTHROSCOPY WITH SUBACROMIAL DECOMPRESSION ;  Surgeon: Leandrew Koyanagi, MD;  Location: [facility];  Service: Orthopedics;  Laterality: Right;  . UPPER GASTROINTESTINAL ENDOSCOPY      Family History:  Family History  Problem Relation Age of Onset  . Lung cancer Father        was a smoker  . Alcohol abuse Father   . Hypertension Mother   . Colon polyps Mother   . Breast cancer Paternal Grandmother   . Alcohol abuse Paternal Grandfather   . Colon cancer Neg Hx   . Rectal cancer Neg Hx   . Stomach cancer Neg Hx     Social History:  reports that he has been smoking cigarettes. He has a 15.00 pack-year smoking history. He has never used smokeless tobacco. He reports current alcohol use. He reports that he does not use  drugs.  Additional Social History:  Alcohol / Drug Use Pain Medications: See MAR Prescriptions: See MAR Over the Counter: See MAR History of alcohol / drug use?: Yes Withdrawal Symptoms: Patient aware of relationship between substance abuse and physical/medical complications Substance #1 Name of Substance 1: Alcohol 1 - Age of First Use: teens 1 - Amount (size/oz): varies 1 - Frequency: 4x/week 1 - Duration: ongoing 1 - Last Use / Amount: 04/11/2019  CIWA: CIWA-Ar BP: (!) 124/56 Pulse Rate: (!) 55 COWS:    Allergies:  Allergies  Allergen Reactions  . Cozaar [Losartan] Anaphylaxis  . Lisinopril Anaphylaxis    Home Medications: (Not in a hospital admission)   OB/GYN Status:  No LMP for male patient.  General Assessment Data Assessment unable to be completed: Yes Reason for not completing assessment: TTS calling telepsych cart, no answer Location of Assessment: [facility] ED TTS Assessment: In system Is this a Tele or Face-to-Face Assessment?: Tele Assessment Is this an Initial Assessment or a Re-assessment for this encounter?: Initial Assessment Patient Accompanied by:: N/A Language Other than English: No Living Arrangements: Other (Comment) What gender do you identify as?: Male Marital status: Divorced Pregnancy Status: No Living Arrangements: Parent Can pt return to current living arrangement?: Yes Admission Status: Voluntary Is patient capable of signing voluntary admission?: Yes Referral Source: Self/Family/Friend Insurance type: none     Crisis Care Plan Living Arrangements: Parent Name of Psychiatrist: none Name of Therapist: none  Education Status Is patient currently in school?: No Is the patient employed, unemployed or receiving disability?: Unemployed  Risk to self with the past 6 months Suicidal Ideation: No Has patient been a risk to self within the past 6 months prior to admission? : No Suicidal Intent: No Has patient had any suicidal intent  within the past 6 months prior to admission? : No Is patient at risk for suicide?: No Suicidal Plan?: No Has patient had any suicidal plan within the past 6 months prior to admission? : No Access to Means: No What has been your use of drugs/alcohol within the last 12 months?: alcohol Previous Attempts/Gestures: Yes How many times?: 1 Other Self Harm Risks: alcohol abuse, hx of depression, suicide attempt Triggers for Past Attempts: Other personal contacts Intentional Self Injurious Behavior: None Family Suicide History: No Recent stressful life event(s): Divorce, Other (Comment)(chronic pain) Persecutory voices/beliefs?: No Depression: Yes Depression Symptoms: Despondent, Insomnia, Loss of interest in usual pleasures, Feeling worthless/self pity Substance abuse history and/or treatment for substance abuse?: Yes Suicide prevention information given to non-admitted patients: Not applicable  Risk to Others within the past 6 months Homicidal Ideation: No Does patient have any lifetime risk of violence toward others beyond the six months  prior to admission? : No Thoughts of Harm to Others: No Current Homicidal Intent: No Current Homicidal Plan: No Access to Homicidal Means: No History of harm to others?: No Assessment of Violence: None Noted Does patient have access to weapons?: No Criminal Charges Pending?: No Does patient have a court date: No Is patient on probation?: No  Psychosis Hallucinations: None noted Delusions: None noted  Mental Status Report Appearance/Hygiene: In hospital gown Eye Contact: Good Motor Activity: Freedom of movement Speech: Logical/coherent Level of Consciousness: Alert, Crying Mood: Depressed, Despair, Anxious, Helpless, Sad, Sullen Affect: Anxious, Depressed, Sad, Sullen, Flat Anxiety Level: Moderate Thought Processes: Relevant, Coherent Judgement: Partial Orientation: Person, Place, Time, Situation, Appropriate for developmental age Obsessive  Compulsive Thoughts/Behaviors: None  Cognitive Functioning Concentration: Normal Memory: Remote Intact, Recent Intact Is patient IDD: No Insight: Fair Impulse Control: Good Appetite: Poor Have you had any weight changes? : Loss Amount of the weight change? (lbs): 3 lbs Sleep: Decreased Total Hours of Sleep: 4 Vegetative Symptoms: None  ADLScreening [name] Assessment Services) Patient's cognitive ability adequate to safely complete daily activities?: Yes Patient able to express need for assistance with ADLs?: Yes Independently performs ADLs?: Yes (appropriate for developmental age)  Prior Inpatient Therapy Prior Inpatient Therapy: Yes Prior Therapy Dates: 2019 Prior Therapy Facilty/Provider(s): [facility] Reason for Treatment: Bipolar  Prior Outpatient Therapy Prior Outpatient Therapy: Yes Prior Therapy Dates: 2018 Prior Therapy Facilty/Provider(s): [facility] Reason for Treatment: Bipolar Does patient have an ACCT team?: No Does patient have Intensive In-House Services?  : No Does patient have [facility] services? : No Does patient have P4CC services?: No  ADL Screening (condition at time of admission) Patient's cognitive ability adequate to safely complete daily activities?: Yes Is the patient deaf or have difficulty hearing?: No Does the patient have difficulty seeing, even when wearing glasses/contacts?: No Does the patient have difficulty concentrating, remembering, or making decisions?: No Patient able to express need for assistance with ADLs?: Yes Does the patient have difficulty dressing or bathing?: No Independently performs ADLs?: Yes (appropriate for developmental age) Does the patient have difficulty walking or climbing stairs?: No Weakness of Legs: None Weakness of Arms/Hands: None  Home Assistive Devices/Equipment Home Assistive Devices/Equipment: None    Abuse/Neglect Assessment (Assessment to be complete while patient is  alone) Abuse/Neglect Assessment Can Be Completed: Yes Physical Abuse: Denies Verbal Abuse: Denies Sexual Abuse: Denies Exploitation of patient/patient's resources: Denies Self-Neglect: Denies     Regulatory affairs officer (For Healthcare) Does Patient Have a Medical Advance Directive?: No Would patient like information on creating a medical advance directive?: No - Patient declined          Disposition: Per [name], NP pt does not meet criteria for inpt tx and recommends pt follow up with OPT [place] providers. TTS will fax OPT resources for ongoing mental health needs.  Disposition Initial Assessment Completed for this Encounter: Yes Disposition of Patient: Discharge Patient refused recommended treatment: No  This service was provided via telemedicine using a 2-way, interactive audio and video technology.  Names of all persons participating in this telemedicine service and their role in this encounter. Name: Panfilo Sibaja Role: Patient  Name: Lind Covert Role: TTS          Lyanne Co 04/11/2019 8:09 PM

## 2019-04-11 NOTE — Discharge Instructions (Signed)
Take Librium taper as prescribed to help with any alcohol withdrawal if you proceed with quitting cold [place].  If you continue to drink alcohol, then do not take this medicine.  Return to ER if you develop any thoughts of suicide, severe anxiousness, chest pain, lightheadedness, sweating, shaking.  Please call your primary doctor to get an appointment tomorrow or on Tuesday.

## 2019-04-11 NOTE — ED Notes (Signed)
Patient transported to X-ray 

## 2019-04-11 NOTE — ED Notes (Signed)
Pt's CBG result was 142. Informed Elizabeth - RN.

## 2019-04-11 NOTE — ED Triage Notes (Signed)
Pt here from home via [facility] for alcohol abuse, depression, generalized pain r/t neuropathy. EMS found to be in ventricular bigeminy, HR range 36-80. BP 160/93. Per pt's mom, pt has a cardiac history. Pt reports chest pain, non-radiating. Ems gave 324 aspirin. Pt is bipolar and is non-compliant w/ meds.

## 2019-04-11 NOTE — ED Notes (Signed)
Patient verbalizes understanding of discharge instructions. Opportunity for questioning and answers were provided. Armband removed by staff, pt discharged from ED.  

## 2019-04-11 NOTE — ED Provider Notes (Signed)
[facility] EMERGENCY DEPARTMENT Provider Note   CSN: [id number] Arrival date & time: 04/11/19  1725     History Chief Complaint  Patient presents with  . Chest Pain  . Alcohol Problem    Mark [name] is a 54 y.o. male.  Presenting to the emergency department with multiple complaints.  Patient states he suffers from daily severe, unrelenting pain.  Pain is primarily in his lower extremities, secondary to neuropathy.  Also has daily abdominal pain, daily chest pain.  He has not had any significant changes in the severity, quality, temporality of his pain.  Followed by pain clinic for chronic pain.  States that he has long history of alcohol abuse, says he feels very depressed though he does not think he would ever end his life, has not had suicidal thoughts.  No auditory or visual hallucinations.  HPI     Past Medical History:  Diagnosis Date  . Alcohol withdrawal ([place])   . Allergy   . Anxiety Dx 2014  . COPD (chronic obstructive pulmonary disease) ([place])   . Drug abuse ([place])    pt reports opioid dependence due to previous back surgeries  . Enlarged prostate   . ETOH abuse   . GERD (gastroesophageal reflux disease) Dx 2003  . Headache(784.0)   . Hepatitis   . Hyperlipidemia Dx 2000  . Hypertension Dx 2011  . IBS (irritable bowel syndrome)    alternates constiaption/ diarrhea  . Irregular heart beat   . 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 Mental disorder   . Neuromuscular disorder ([place])    hands hurt   . Neuropathy 06/07/2013  . Seizure due to alcohol withdrawal ([place]) 01/23/2014   Per patient report  . Shortness of breath   . Withdrawal seizures ([place]) 2014   last seizure 2014 etoh with drawal    Patient Active Problem List   Diagnosis Date Noted  . Trigeminy 02/12/2019  . Muscle spasm of back 11/09/2018  . Elevated liver enzymes 11/09/2018  . Hyponatremia 08/15/2018  . Elevated LFTs 08/15/2018  . Neurogenic pain 08/10/2018  . Osteoarthritis of  hip (Right) 04/07/2018  . Spondylosis without myelopathy or radiculopathy, lumbar region 03/23/2018  . Other specified dorsopathies, sacral and sacrococcygeal region 03/23/2018  . Chronic hip pain (Right) 03/23/2018  . Chronic sacroiliac joint pain (Left) 03/23/2018  . Alcohol withdrawal ([place]) 12/31/2017  . Dyspepsia   . Opioid-induced hyperalgesia (with oxycodone) 02/11/2017  . Allodynia (Lower extremities) 12/26/2016  . Hyperalgesia (Lower extremities) 12/26/2016  . Sympathetic pain (lower extremity) 09/26/2016  . Lower extremity neuropathy (Primary Area of Pain) (Bilateral) (R>L) 09/26/2016  . Lumbar facet syndrome (Bilateral) (R>L) 08/22/2016  . Chronic sacroiliac joint pain (Bilateral) (R>L) 08/22/2016  . Chronic lower extremity pain (Bilateral) (R>L) 06/26/2016  . Opiate use 05/14/2016  . Occipital headache (Bilateral) (R>L) 04/16/2016  . Chronic musculoskeletal pain 04/15/2016  . 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 Major depressive disorder, recurrent episode, moderate ([place]) 04/10/2016  . Long term current use of opiate analgesic 03/18/2016  . Long term prescription opiate use 03/18/2016  . Chronic neck pain (Bilateral)(R>L) 03/18/2016  . Cervical fusion syndrome 03/18/2016  . 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 Chronic knee pain  (Bilateral) (R>L) 03/18/2016  . GAD (generalized anxiety disorder) 12/06/2015  . Alcohol use disorder, severe, in early remission ([place]) 12/06/2015  . GERD (gastroesophageal reflux disease) 07/24/2015  . Chronic anxiety 12/19/2014  . Erectile dysfunction 09/16/2014  . Poor dentition 07/28/2014  .  Chronic maxillary sinusitis 07/28/2014  . Vitamin D insufficiency 07/05/2014  . Chronic fatigue 07/04/2014  . Chronic radicular low back pain (Third area of Pain) (Right) (to calf) 06/02/2014  . Chronic shoulder pain (Right) 05/12/2014  . Major depressive disorder, recurrent episode, severe ([place])   . Alcohol use disorder,  severe, dependence ([place])   . 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 Hypokalemia   . Opioid dependence ([place]) 01/22/2014    Class: Acute  . Paroxysmal VT ([place]) 12/19/2013  . Chronic low back pain (Secondary area of Pain) (Bilateral) (R>L) 12/28/2012  . Chronic pain syndrome 12/28/2012  . Diarrhea 12/28/2012  . Alcohol intoxication ([place]) 06/09/2012  . 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 Nonsustained ventricular tachycardia ([place]) 01/20/2012  . Tobacco abuse 01/19/2012  . Essential hypertension   . Hyperlipidemia     Past Surgical History:  Procedure Laterality Date  . [place] STUDY N/A 01/16/2015   Procedure: [place] STUDY;  Surgeon: Manus Gunning, MD;  Location: [facility];  Service: Gastroenterology;  Laterality: N/A;  . [place]  . COLONOSCOPY  1998  . LEFT HEART CATHETERIZATION WITH CORONARY ANGIOGRAM N/A 01/13/2014   Procedure: LEFT HEART CATHETERIZATION WITH CORONARY ANGIOGRAM;  Surgeon: Clent Demark, MD;  Location: [facility];  Service: Cardiovascular;  Laterality: N/A;  . NASAL SINUS SURGERY    . NECK SURGERY  2000   cervial fusion   . NISSEN FUNDOPLICATION    . SHOULDER SURGERY Right   . SUBACROMIAL DECOMPRESSION Right 11/16/2014   Procedure: RIGHT SHOULDER ARTHROSCOPY WITH SUBACROMIAL DECOMPRESSION ;  Surgeon: Leandrew Koyanagi, MD;  Location: [facility];  Service: Orthopedics;  Laterality: Right;  . UPPER GASTROINTESTINAL ENDOSCOPY         Family History  Problem Relation Age of Onset  . Lung cancer Father        was a smoker  . Alcohol abuse Father   . Hypertension Mother   . Colon polyps Mother   . Breast cancer Paternal Grandmother   . Alcohol abuse Paternal Grandfather   . Colon cancer Neg Hx   . Rectal cancer Neg Hx   . Stomach cancer Neg Hx     Social History   Tobacco Use  . Smoking status: Current Every Day Smoker    Packs/day: 0.50    Years: 30.00    Pack years: 15.00    Types:  Cigarettes  . Smokeless tobacco: Never Used  Substance Use Topics  . Alcohol use: Yes    Alcohol/week: 0.0 standard drinks    Comment: Case a beer a day   . Drug use: No    Comment: Opana    Home Medications Prior to Admission medications   Medication Sig Start Date End Date Taking? Authorizing Provider  albuterol (VENTOLIN HFA) 108 (90 Base) MCG/ACT inhaler Inhale 2 puffs into the lungs every 6 (six) hours as needed for wheezing or shortness of breath. 08/12/17   Ladell Pier, MD  amLODipine (NORVASC) 10 MG tablet Take 1 tablet (10 mg total) by mouth daily for 30 days. Patient not taking: Reported on 02/11/2019 08/17/18 02/08/19  Shelly Coss, MD  baclofen (LIORESAL) 10 MG tablet Take 1 tablet (10 mg total) by mouth 3 (three) times daily. Patient taking differently: Take 10 mg by mouth 3 (three) times daily as needed for muscle spasms.  02/08/19 08/07/19  Milinda Pointer, MD  chlordiazePOXIDE (LIBRIUM) 25 MG capsule 25mg  PO TID  x 1D, then 25mg  PO BID X 1D, then 25mg  PO QD X 1D 04/11/19   Lucrezia Starch, MD  famotidine (PEPCID AC) 10 MG chewable tablet Chew 10 mg by mouth as needed for heartburn.    [provider]  gabapentin (NEURONTIN) 100 MG capsule Take 2 capsules (200 mg total) by mouth 3 (three) times daily. 02/08/19 08/07/19  Milinda Pointer, MD  Magnesium Oxide 500 MG CAPS Take 1 capsule (500 mg total) by mouth 2 (two) times daily at 8 am and 10 pm. 02/08/19 02/08/20  Milinda Pointer, MD  metoprolol succinate (TOPROL-XL) 100 MG 24 hr tablet Take 50 mg by mouth 3 (three) times daily.  07/03/18   [provider]  omeprazole (PRILOSEC OTC) 20 MG tablet Take 20 mg by mouth 2 (two) times daily as needed (heartburn).     [provider]  oxyCODONE (OXY IR/ROXICODONE) 5 MG immediate release tablet Take 1 tablet (5 mg total) by mouth 5 (five) times daily for 10 doses. 02/16/19 02/18/19  Nita Sells, MD  thiamine 100 MG tablet Take 1 tablet  (100 mg total) by mouth daily. Patient not taking: Reported on 02/11/2019 08/18/18   Shelly Coss, MD  traZODone (DESYREL) 100 MG tablet Take 1 tablet (100 mg total) by mouth at bedtime as needed. Patient taking differently: Take 50-100 mg by mouth at bedtime as needed for sleep.  08/21/17   Ladell Pier, MD    Allergies    Cozaar [losartan] and Lisinopril  Review of Systems   Review of Systems  Constitutional: Negative for chills and fever.  HENT: Negative for ear pain and sore throat.   Eyes: Negative for pain and visual disturbance.  Respiratory: Negative for cough and shortness of breath.   Cardiovascular: Positive for chest pain. Negative for palpitations.  Gastrointestinal: Negative for abdominal pain and vomiting.  Genitourinary: Negative for dysuria and hematuria.  Musculoskeletal: Positive for arthralgias and back pain.  Skin: Negative for color change and rash.  Neurological: Negative for seizures and syncope.  All other systems reviewed and are negative.   Physical Exam Updated Vital Signs BP 129/77   Pulse (!) 55   Temp 97.9 F (36.6 C) (Oral)   Resp 14   SpO2 96%   Physical Exam Vitals and nursing note reviewed.  Constitutional:      Appearance: He is well-developed.  HENT:     Head: Normocephalic and atraumatic.  Eyes:     Conjunctiva/sclera: Conjunctivae normal.  Cardiovascular:     Rate and Rhythm: Normal rate and regular rhythm.     Heart sounds: No murmur.  Pulmonary:     Effort: Pulmonary effort is normal. No respiratory distress.     Breath sounds: Normal breath sounds.  Abdominal:     Palpations: Abdomen is soft.     Tenderness: There is no abdominal tenderness.  Musculoskeletal:     Cervical back: Neck supple.  Skin:    General: Skin is warm and dry.  Neurological:     Mental Status: He is alert.  Psychiatric:     Comments: Tearful, depressed     ED Results / Procedures / Treatments   Labs (all labs ordered are listed, but only  abnormal results are displayed) Labs Reviewed  CBC WITH DIFFERENTIAL/PLATELET - Abnormal; Notable for the following components:      Result Value   RBC 6.93 (*)    Hemoglobin 20.0 (*)    HCT 57.9 (*)    All other components within  normal limits  BASIC METABOLIC PANEL - Abnormal; Notable for the following components:   Sodium 132 (*)    Chloride 97 (*)    CO2 15 (*)    Glucose, Bld 122 (*)    BUN <5 (*)    Anion gap 20 (*)    All other components within normal limits  URINALYSIS, ROUTINE W REFLEX MICROSCOPIC - Abnormal; Notable for the following components:   Color, Urine STRAW (*)    Specific Gravity, Urine 1.002 (*)    All other components within normal limits  BRAIN NATRIURETIC PEPTIDE - Abnormal; Notable for the following components:   B Natriuretic Peptide 133.3 (*)    All other components within normal limits  CBG MONITORING, ED - Abnormal; Notable for the following components:   Glucose-Capillary 142 (*)    All other components within normal limits  RAPID URINE DRUG SCREEN, HOSP PERFORMED  TROPONIN I (HIGH SENSITIVITY)  TROPONIN I (HIGH SENSITIVITY)    EKG EKG Interpretation  Date/Time:  Sunday April 11 2019 17:27:08 EST Ventricular Rate:  79 PR Interval:    QRS Duration: 116 QT Interval:  415 QTC Calculation: 476 R Axis:   31 Text Interpretation: Sinus rhythm Ventricular trigeminy Probable left atrial enlargement Nonspecific intraventricular conduction delay Confirmed by ,  ([id number]) on 04/11/2019 5:45:23 PM   Radiology DG Chest 2 View  Result Date: 04/11/2019 CLINICAL DATA:  Chest pain. EXAM: CHEST - 2 VIEW COMPARISON:  February 11, 2019 FINDINGS: Mild, stable linear scarring and/or atelectasis is seen within the left lung base. There is no evidence of acute infiltrate, pleural effusion or pneumothorax. The heart size and mediastinal contours are within normal limits. A radiopaque fusion plate and screws are seen overlying the lower cervical spine.  Degenerative changes seen throughout the thoracic spine. IMPRESSION: No acute cardiopulmonary disease. Electronically Signed   By: Thaddeus  Houston M.D.   On: 04/11/2019 18:44    Procedures Procedures (including critical care time)  Medications Ordered in ED Medications  HYDROmorphone (DILAUDID) injection 1 mg (1 mg Intravenous Given 04/11/19 1914)    ED Course  I have reviewed the triage vital signs and the nursing notes.  Pertinent labs & imaging results that were available during my care of the patient were reviewed by me and considered in my medical decision making (see chart for details).  Clinical Course as of Apr 10 2337  Sun Apr 11, 2019  2113 Recheck patient, reviewed results, reviewed TTS recommendations, patient is not exhibiting any signs or symptoms of withdrawal at this time.  Will give Rx for Librium taper.  Recommended close follow-up with his primary doctor, ideally to be seen tomorrow or on Tuesday.  Reviewed return precautions, will discharge home.   [RD]    Clinical Course User Index [RD] ,  S, MD   MDM Rules/Calculators/A&P                      53  year old male chronic pain, COPD, alcoholism, history of alcohol withdrawal seizures presenting to ER with concern for chronic pain, depression, alcohol abuse.  Given description of pain, suspect more chronic in nature, doubt acute cardiac process.  EKG without acute changes, troponin x2 within normal limits.  CXR negative.  Patient has history of alcohol withdrawal seizures, prior admissions for withdrawal.  Currently patient is not exhibiting any signs/symptoms of withdrawal.  Vital signs are stable.  Do not feel he needs admission for this at this time.  Given his reported depression, assistance  with resources, consulted TTS who assessed patient, does not meet inpatient, provided resources.  Patient wants to try stopping alcohol at home.  Recommended that he discuss with his primary doctor, provided Rx for  Librium taper.  Reviewed strict return precautions, will discharge home at this time.    After the discussed management above, the patient was determined to be safe for discharge.  The patient was in agreement with this plan and all questions regarding their care were answered.  ED return precautions were discussed and the patient will return to the ED with any significant worsening of condition.  Final Clinical Impression(s) / ED Diagnoses Final diagnoses:  Alcoholism ([place])  Other chronic pain    Rx / DC Orders ED Discharge Orders         Ordered    chlordiazePOXIDE (LIBRIUM) 25 MG capsule     04/11/19 2117           Lucrezia Starch, MD 04/11/19 2339

## 2019-04-11 NOTE — Progress Notes (Signed)
Per [name], NP pt does not meet criteria for inpt tx and recommends pt follow up with [facility] providers. TTS will fax [facility] resources for ongoing mental health needs. EDP Dykstra, Ellwood Dense, MD and Wilfred Lacy, RN have been advised.   Lind Covert, MSW, LCSW Therapeutic Triage Specialist  [phone]'

## 2019-04-12 ENCOUNTER — Other Ambulatory Visit: Payer: Self-pay

## 2019-04-12 ENCOUNTER — Encounter (HOSPITAL_COMMUNITY): Payer: Self-pay | Admitting: Internal Medicine

## 2019-04-12 ENCOUNTER — Emergency Department (HOSPITAL_COMMUNITY): Payer: Self-pay

## 2019-04-12 ENCOUNTER — Inpatient Hospital Stay (HOSPITAL_COMMUNITY): Payer: Self-pay

## 2019-04-12 ENCOUNTER — Inpatient Hospital Stay (HOSPITAL_COMMUNITY)
Admission: EM | Admit: 2019-04-12 | Discharge: 2019-04-13 | DRG: 309 | Discharge status: Against Medical Advice | Payer: Self-pay | Attending: Internal Medicine | Admitting: Internal Medicine

## 2019-04-12 DIAGNOSIS — I472 Ventricular tachycardia: Principal | ICD-10-CM | POA: Diagnosis present

## 2019-04-12 DIAGNOSIS — G8929 Other chronic pain: Secondary | ICD-10-CM | POA: Diagnosis present

## 2019-04-12 DIAGNOSIS — Z803 Family history of malignant neoplasm of breast: Secondary | ICD-10-CM

## 2019-04-12 DIAGNOSIS — M549 Dorsalgia, unspecified: Secondary | ICD-10-CM | POA: Diagnosis present

## 2019-04-12 DIAGNOSIS — Z801 Family history of malignant neoplasm of trachea, bronchus and lung: Secondary | ICD-10-CM

## 2019-04-12 DIAGNOSIS — F19939 Other psychoactive substance use, unspecified with withdrawal, unspecified: Secondary | ICD-10-CM | POA: Diagnosis present

## 2019-04-12 DIAGNOSIS — M79622 Pain in left upper arm: Secondary | ICD-10-CM | POA: Diagnosis not present

## 2019-04-12 DIAGNOSIS — E785 Hyperlipidemia, unspecified: Secondary | ICD-10-CM | POA: Diagnosis present

## 2019-04-12 DIAGNOSIS — Z20822 Contact with and (suspected) exposure to covid-19: Secondary | ICD-10-CM | POA: Diagnosis present

## 2019-04-12 DIAGNOSIS — E872 Acidosis: Secondary | ICD-10-CM | POA: Diagnosis present

## 2019-04-12 DIAGNOSIS — W1800XA Striking against unspecified object with subsequent fall, initial encounter: Secondary | ICD-10-CM

## 2019-04-12 DIAGNOSIS — R1010 Upper abdominal pain, unspecified: Secondary | ICD-10-CM

## 2019-04-12 DIAGNOSIS — R079 Chest pain, unspecified: Secondary | ICD-10-CM

## 2019-04-12 DIAGNOSIS — E876 Hypokalemia: Secondary | ICD-10-CM | POA: Diagnosis present

## 2019-04-12 DIAGNOSIS — I4729 Other ventricular tachycardia: Secondary | ICD-10-CM

## 2019-04-12 DIAGNOSIS — Z8249 Family history of ischemic heart disease and other diseases of the circulatory system: Secondary | ICD-10-CM

## 2019-04-12 DIAGNOSIS — Z811 Family history of alcohol abuse and dependence: Secondary | ICD-10-CM

## 2019-04-12 DIAGNOSIS — I426 Alcoholic cardiomyopathy: Secondary | ICD-10-CM | POA: Diagnosis present

## 2019-04-12 DIAGNOSIS — Z888 Allergy status to other drugs, medicaments and biological substances status: Secondary | ICD-10-CM

## 2019-04-12 DIAGNOSIS — I10 Essential (primary) hypertension: Secondary | ICD-10-CM | POA: Diagnosis present

## 2019-04-12 DIAGNOSIS — F10230 Alcohol dependence with withdrawal, uncomplicated: Secondary | ICD-10-CM | POA: Diagnosis present

## 2019-04-12 DIAGNOSIS — F17210 Nicotine dependence, cigarettes, uncomplicated: Secondary | ICD-10-CM | POA: Diagnosis present

## 2019-04-12 LAB — LACTIC ACID, PLASMA
Lactic Acid, Venous: 4.4 mmol/L (ref 0.5–1.9)
Lactic Acid, Venous: 4.5 mmol/L (ref 0.5–1.9)
Lactic Acid, Venous: 4.4 mmol/L (ref 0.5–1.9)

## 2019-04-12 LAB — GLUCOSE, CAPILLARY: Glucose-Capillary: 106 mg/dL — ABNORMAL HIGH (ref 70–99)

## 2019-04-12 LAB — COMPREHENSIVE METABOLIC PANEL
ALT: 19 U/L (ref 0–44)
AST: 23 U/L (ref 15–41)
Albumin: 3.5 g/dL (ref 3.5–5.0)
Alkaline Phosphatase: 99 U/L (ref 38–126)
Anion gap: 18 — ABNORMAL HIGH (ref 5–15)
BUN: <5 mg/dL — ABNORMAL LOW (ref 6–20)
CO2: 20 mmol/L — ABNORMAL LOW (ref 22–32)
Calcium: 8.3 mg/dL — ABNORMAL LOW (ref 8.9–10.3)
Chloride: 95 mmol/L — ABNORMAL LOW (ref 98–111)
Creatinine, Ser: 0.79 mg/dL (ref 0.61–1.24)
GFR calc Af Amer: >60 mL/min (ref >60)
GFR calc non Af Amer: >60 mL/min (ref >60)
Glucose, Bld: 176 mg/dL — ABNORMAL HIGH (ref 70–99)
Potassium: 3.1 mmol/L — ABNORMAL LOW (ref 3.5–5.1)
Sodium: 133 mmol/L — ABNORMAL LOW (ref 135–145)
Total Bilirubin: 0.9 mg/dL (ref 0.3–1.2)
Total Protein: 6.1 g/dL — ABNORMAL LOW (ref 6.5–8.1)

## 2019-04-12 LAB — CBC WITH DIFFERENTIAL/PLATELET
Abs Immature Granulocytes: 0.05 10*3/uL (ref 0.00–0.07)
Basophils Absolute: 0.1 10*3/uL (ref 0.0–0.1)
Basophils Relative: 1 %
Eosinophils Absolute: 0.1 10*3/uL (ref 0.0–0.5)
Eosinophils Relative: 1 %
HCT: 51.8 % (ref 39.0–52.0)
Hemoglobin: 17.7 g/dL — ABNORMAL HIGH (ref 13.0–17.0)
Immature Granulocytes: 1 %
Lymphocytes Relative: 12 %
Lymphs Abs: 1.1 10*3/uL (ref 0.7–4.0)
MCH: 28.3 pg (ref 26.0–34.0)
MCHC: 34.2 g/dL (ref 30.0–36.0)
MCV: 82.7 fL (ref 80.0–100.0)
Monocytes Absolute: 0.7 10*3/uL (ref 0.1–1.0)
Monocytes Relative: 8 %
Neutro Abs: 6.7 10*3/uL (ref 1.7–7.7)
Neutrophils Relative %: 77 %
Platelets: 317 10*3/uL (ref 150–400)
RBC: 6.26 MIL/uL — ABNORMAL HIGH (ref 4.22–5.81)
RDW: 13.2 % (ref 11.5–15.5)
WBC: 8.7 10*3/uL (ref 4.0–10.5)
nRBC: 0 % (ref 0.0–0.2)

## 2019-04-12 LAB — BRAIN NATRIURETIC PEPTIDE: B Natriuretic Peptide: 198.6 pg/mL — ABNORMAL HIGH (ref 0.0–100.0)

## 2019-04-12 LAB — LIPASE, BLOOD: Lipase: 30 U/L (ref 11–51)

## 2019-04-12 LAB — SARS CORONAVIRUS 2 (TAT 6-24 HRS): SARS Coronavirus 2: NEGATIVE

## 2019-04-12 LAB — TROPONIN I (HIGH SENSITIVITY)
Troponin I (High Sensitivity): 5 ng/L (ref <18)
Troponin I (High Sensitivity): 4 ng/L (ref <18)

## 2019-04-12 LAB — ETHANOL: Alcohol, Ethyl (B): 58 mg/dL — ABNORMAL HIGH (ref <10)

## 2019-04-12 LAB — MRSA PCR SCREENING: MRSA by PCR: NEGATIVE

## 2019-04-12 LAB — MAGNESIUM: Magnesium: 2.4 mg/dL (ref 1.7–2.4)

## 2019-04-12 MED ORDER — CHLORDIAZEPOXIDE HCL 5 MG PO CAPS
25.0000 mg | ORAL_CAPSULE | Freq: Once | ORAL | Status: AC
  Administered 2019-04-13: 25 mg via ORAL
  Filled 2019-04-12: qty 5

## 2019-04-12 MED ORDER — POTASSIUM CHLORIDE CRYS ER 20 MEQ PO TBCR
30.0000 meq | EXTENDED_RELEASE_TABLET | Freq: Two times a day (BID) | ORAL | Status: AC
  Administered 2019-04-12 – 2019-04-13 (×2): 30 meq via ORAL
  Filled 2019-04-12 (×2): qty 1

## 2019-04-12 MED ORDER — LORAZEPAM 1 MG PO TABS
1.0000 mg | ORAL_TABLET | ORAL | Status: DC | PRN
  Filled 2019-04-12: qty 4

## 2019-04-12 MED ORDER — ADULT MULTIVITAMIN W/MINERALS CH
1.0000 | ORAL_TABLET | Freq: Every day | ORAL | Status: DC
  Administered 2019-04-12 – 2019-04-13 (×2): 1 via ORAL
  Filled 2019-04-12 (×2): qty 1

## 2019-04-12 MED ORDER — POTASSIUM CHLORIDE CRYS ER 20 MEQ PO TBCR
20.0000 meq | EXTENDED_RELEASE_TABLET | Freq: Once | ORAL | Status: AC
  Administered 2019-04-12: 20 meq via ORAL
  Filled 2019-04-12: qty 1

## 2019-04-12 MED ORDER — THIAMINE HCL 100 MG/ML IJ SOLN
100.0000 mg | Freq: Every day | INTRAMUSCULAR | Status: DC
  Administered 2019-04-12: 100 mg via INTRAVENOUS
  Filled 2019-04-12: qty 2

## 2019-04-12 MED ORDER — MORPHINE SULFATE (PF) 4 MG/ML IV SOLN
4.0000 mg | Freq: Once | INTRAVENOUS | Status: AC
  Administered 2019-04-12: 4 mg via INTRAVENOUS
  Filled 2019-04-12: qty 1

## 2019-04-12 MED ORDER — MAGNESIUM OXIDE 400 (241.3 MG) MG PO TABS
400.0000 mg | ORAL_TABLET | Freq: Two times a day (BID) | ORAL | Status: DC
  Administered 2019-04-12 – 2019-04-13 (×2): 400 mg via ORAL
  Filled 2019-04-12 (×2): qty 1

## 2019-04-12 MED ORDER — FOLIC ACID 1 MG PO TABS
1.0000 mg | ORAL_TABLET | Freq: Every day | ORAL | Status: DC
  Administered 2019-04-12 – 2019-04-13 (×2): 1 mg via ORAL
  Filled 2019-04-12 (×2): qty 1

## 2019-04-12 MED ORDER — OXYCODONE HCL 5 MG PO TABS
5.0000 mg | ORAL_TABLET | ORAL | Status: DC | PRN
  Administered 2019-04-13 (×2): 5 mg via ORAL
  Filled 2019-04-12 (×2): qty 1

## 2019-04-12 MED ORDER — TRAZODONE HCL 100 MG PO TABS
100.0000 mg | ORAL_TABLET | Freq: Every evening | ORAL | Status: DC | PRN
  Administered 2019-04-12: 100 mg via ORAL
  Filled 2019-04-12 (×2): qty 1

## 2019-04-12 MED ORDER — VITAMIN D 25 MCG (1000 UNIT) PO TABS
1000.0000 [IU] | ORAL_TABLET | Freq: Every day | ORAL | Status: DC
  Administered 2019-04-12 – 2019-04-13 (×2): 1000 [IU] via ORAL
  Filled 2019-04-12 (×2): qty 1

## 2019-04-12 MED ORDER — LACTATED RINGERS IV SOLN: INTRAVENOUS | Status: AC

## 2019-04-12 MED ORDER — METOPROLOL TARTRATE 50 MG PO TABS
50.0000 mg | ORAL_TABLET | Freq: Three times a day (TID) | ORAL | Status: DC
  Administered 2019-04-12 – 2019-04-13 (×3): 50 mg via ORAL
  Filled 2019-04-12: qty 2
  Filled 2019-04-12 (×3): qty 1

## 2019-04-12 MED ORDER — ACETAMINOPHEN 650 MG RE SUPP: 650.0000 mg | Freq: Four times a day (QID) | RECTAL | Status: DC | PRN

## 2019-04-12 MED ORDER — BACLOFEN 10 MG PO TABS
10.0000 mg | ORAL_TABLET | Freq: Three times a day (TID) | ORAL | Status: DC
  Administered 2019-04-12 (×2): 10 mg via ORAL
  Filled 2019-04-12 (×3): qty 1

## 2019-04-12 MED ORDER — GABAPENTIN 100 MG PO CAPS
200.0000 mg | ORAL_CAPSULE | Freq: Three times a day (TID) | ORAL | Status: DC
  Administered 2019-04-12 – 2019-04-13 (×3): 200 mg via ORAL
  Filled 2019-04-12 (×4): qty 2

## 2019-04-12 MED ORDER — LACTATED RINGERS IV BOLUS
1000.0000 mL | Freq: Once | INTRAVENOUS | Status: AC
  Administered 2019-04-12: 1000 mL via INTRAVENOUS

## 2019-04-12 MED ORDER — LORAZEPAM 2 MG/ML IJ SOLN
1.0000 mg | INTRAMUSCULAR | Status: DC | PRN
  Administered 2019-04-12: 3 mg via INTRAVENOUS
  Administered 2019-04-12: 4 mg via INTRAVENOUS
  Administered 2019-04-12: 1 mg via INTRAVENOUS
  Administered 2019-04-12: 3 mg via INTRAVENOUS
  Administered 2019-04-12: 1 mg via INTRAVENOUS
  Administered 2019-04-13 (×2): 2 mg via INTRAVENOUS
  Filled 2019-04-12 (×2): qty 1
  Filled 2019-04-12 (×3): qty 2
  Filled 2019-04-12 (×2): qty 1

## 2019-04-12 MED ORDER — ACETAMINOPHEN 325 MG PO TABS
650.0000 mg | ORAL_TABLET | Freq: Four times a day (QID) | ORAL | Status: DC | PRN
  Filled 2019-04-12: qty 2

## 2019-04-12 MED ORDER — ENOXAPARIN SODIUM 40 MG/0.4ML ~~LOC~~ SOLN
40.0000 mg | SUBCUTANEOUS | Status: DC
  Administered 2019-04-12 – 2019-04-13 (×2): 40 mg via SUBCUTANEOUS
  Filled 2019-04-12 (×2): qty 0.4

## 2019-04-12 MED ORDER — THIAMINE HCL 100 MG PO TABS
100.0000 mg | ORAL_TABLET | Freq: Every day | ORAL | Status: DC
  Administered 2019-04-13: 100 mg via ORAL
  Filled 2019-04-12: qty 1

## 2019-04-12 MED ORDER — SODIUM CHLORIDE 0.9 % IV BOLUS
1000.0000 mL | Freq: Once | INTRAVENOUS | Status: AC
  Administered 2019-04-12: 1000 mL via INTRAVENOUS

## 2019-04-12 MED ORDER — CHLORDIAZEPOXIDE HCL 5 MG PO CAPS
25.0000 mg | ORAL_CAPSULE | Freq: Four times a day (QID) | ORAL | Status: DC
  Administered 2019-04-12 – 2019-04-13 (×3): 25 mg via ORAL
  Filled 2019-04-12 (×4): qty 5

## 2019-04-12 NOTE — ED Provider Notes (Signed)
[facility] EMERGENCY DEPARTMENT Provider Note   CSN: Mark [name] Arrival date & time: 04/12/19  [id number]     History Chief Complaint  Patient presents with  . V-tach/CP    LINK Mark [name] is a 54 y.o. male.  He has a history of chest pain back pain neuropathy.  He said he ran out of Percocet for 5 days ago and has been drinking more.  He is complaining of chest pain lower extremity neuropathy pain alcohol withdrawal upper abdominal pain radiating through to the back.  He was seen yesterday for same.  EMS was concerned that he was having V. tach and so gave to boluses of amiodarone 2 g of magnesium and some Versed for alcohol withdrawal.  Ectopy has improved.  Denies any active drug use.  The history is provided by the patient.  Chest Pain Pain location:  Substernal area Pain quality: aching   Pain radiates to:  Mid back Pain severity:  Moderate Timing:  Constant Progression:  Unchanged Chronicity:  Chronic Relieved by:  Nothing Worsened by:  Nothing Ineffective treatments:  None tried Associated symptoms: abdominal pain, back pain, cough (chronic) and fatigue   Associated symptoms: no altered mental status, no fever, no headache, no nausea, no shortness of breath and no vomiting   Risk factors: hypertension, male sex and smoking        Past Medical History:  Diagnosis Date  . Alcohol withdrawal ([place])   . Allergy   . Anxiety Dx 2014  . COPD (chronic obstructive pulmonary disease) ([place])   . Drug abuse ([place])    pt reports opioid dependence due to previous back surgeries  . Enlarged prostate   . ETOH abuse   . GERD (gastroesophageal reflux disease) Dx 2003  . Headache(784.0)   . Hepatitis   . Hyperlipidemia Dx 2000  . Hypertension Dx 2011  . IBS (irritable bowel syndrome)    alternates constiaption/ diarrhea  . Irregular heart beat   . 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 Mental disorder   . Neuromuscular disorder ([place])    hands hurt   . Neuropathy  06/07/2013  . Seizure due to alcohol withdrawal ([place]) 01/23/2014   Per patient report  . Shortness of breath   . Withdrawal seizures ([place]) 2014   last seizure 2014 etoh with drawal    Patient Active Problem List   Diagnosis Date Noted  . Trigeminy 02/12/2019  . Muscle spasm of back 11/09/2018  . Elevated liver enzymes 11/09/2018  . Hyponatremia 08/15/2018  . Elevated LFTs 08/15/2018  . Neurogenic pain 08/10/2018  . Osteoarthritis of hip (Right) 04/07/2018  . Spondylosis without myelopathy or radiculopathy, lumbar region 03/23/2018  . Other specified dorsopathies, sacral and sacrococcygeal region 03/23/2018  . Chronic hip pain (Right) 03/23/2018  . Chronic sacroiliac joint pain (Left) 03/23/2018  . Alcohol withdrawal (Mark [name]) 12/31/2017  . Dyspepsia   . Opioid-induced hyperalgesia (with oxycodone) 02/11/2017  . Allodynia (Lower extremities) 12/26/2016  . Hyperalgesia (Lower extremities) 12/26/2016  . Sympathetic pain (lower extremity) 09/26/2016  . Lower extremity neuropathy (Primary Area of Pain) (Bilateral) (R>L) 09/26/2016  . Lumbar facet syndrome (Bilateral) (R>L) 08/22/2016  . Chronic sacroiliac joint pain (Bilateral) (R>L) 08/22/2016  . Chronic lower extremity pain (Bilateral) (R>L) 06/26/2016  . Opiate use 05/14/2016  . Occipital headache (Bilateral) (R>L) 04/16/2016  . Chronic musculoskeletal pain 04/15/2016  . 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 Major depressive disorder, recurrent episode, moderate ([place]) 04/10/2016  . Long term current use of  opiate analgesic 03/18/2016  . Long term prescription opiate use 03/18/2016  . Chronic neck pain (Bilateral)(R>L) 03/18/2016  . Cervical fusion syndrome 03/18/2016  . 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 Chronic knee pain  (Bilateral) (R>L) 03/18/2016  . GAD (generalized anxiety disorder) 12/06/2015  . Alcohol use disorder, severe, in early remission ([place]) 12/06/2015  . GERD (gastroesophageal reflux  disease) 07/24/2015  . Chronic anxiety 12/19/2014  . Erectile dysfunction 09/16/2014  . Poor dentition 07/28/2014  . Chronic maxillary sinusitis 07/28/2014  . Vitamin D insufficiency 07/05/2014  . Chronic fatigue 07/04/2014  . Chronic radicular low back pain (Third area of Pain) (Right) (to calf) 06/02/2014  . Chronic shoulder pain (Right) 05/12/2014  . Major depressive disorder, recurrent episode, severe ([place])   . Alcohol use disorder, severe, dependence ([place])   . 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 Hypokalemia   . Opioid dependence ([place]) 01/22/2014    Class: Acute  . Paroxysmal VT ([place]) 12/19/2013  . Chronic low back pain (Secondary area of Pain) (Bilateral) (R>L) 12/28/2012  . Chronic pain syndrome 12/28/2012  . Diarrhea 12/28/2012  . Alcohol intoxication (Mark [name]) 06/09/2012  . 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 Nonsustained ventricular tachycardia ([place]) 01/20/2012  . Tobacco abuse 01/19/2012  . Essential hypertension   . Hyperlipidemia     Past Surgical History:  Procedure Laterality Date  . [place] STUDY N/A 01/16/2015   Procedure: [place] STUDY;  Surgeon: Manus Gunning, MD;  Location: [facility];  Service: Gastroenterology;  Laterality: N/A;  . [place]  . COLONOSCOPY  1998  . LEFT HEART CATHETERIZATION WITH CORONARY ANGIOGRAM N/A 01/13/2014   Procedure: LEFT HEART CATHETERIZATION WITH CORONARY ANGIOGRAM;  Surgeon: Mark Demark, MD;  Location: [facility];  Service: Cardiovascular;  Laterality: N/A;  . NASAL SINUS SURGERY    . NECK SURGERY  2000   cervial fusion   . NISSEN FUNDOPLICATION    . SHOULDER SURGERY Right   . SUBACROMIAL DECOMPRESSION Right 11/16/2014   Procedure: RIGHT SHOULDER ARTHROSCOPY WITH SUBACROMIAL DECOMPRESSION ;  Surgeon: Mark Koyanagi, MD;  Location: [facility];  Service: Orthopedics;  Laterality: Right;  . UPPER GASTROINTESTINAL ENDOSCOPY         Family History  Problem  Relation Age of Onset  . Lung cancer Father        was a smoker  . Alcohol abuse Father   . Hypertension Mother   . Colon polyps Mother   . Breast cancer Paternal Grandmother   . Alcohol abuse Paternal Grandfather   . Colon cancer Neg Hx   . Rectal cancer Neg Hx   . Stomach cancer Neg Hx     Social History   Tobacco Use  . Smoking status: Current Every Day Smoker    Packs/day: 0.50    Years: 30.00    Pack years: 15.00    Types: Cigarettes  . Smokeless tobacco: Never Used  Substance Use Topics  . Alcohol use: Yes    Alcohol/week: 0.0 standard drinks    Comment: Case a beer a day   . Drug use: No    Comment: Opana    Home Medications Prior to Admission medications   Medication Sig Start Date End Date Taking? Authorizing Provider  albuterol (VENTOLIN HFA) 108 (90 Base) MCG/ACT inhaler Inhale 2 puffs into the lungs every 6 (six) hours as needed for wheezing or shortness of breath. 08/12/17   Ladell Pier,  MD  amLODipine (NORVASC) 10 MG tablet Take 1 tablet (10 mg total) by mouth daily for 30 days. Patient not taking: Reported on 02/11/2019 08/17/18 02/08/19  Shelly Coss, MD  baclofen (LIORESAL) 10 MG tablet Take 1 tablet (10 mg total) by mouth 3 (three) times daily. Patient taking differently: Take 10 mg by mouth 3 (three) times daily as needed for muscle spasms.  02/08/19 08/07/19  Milinda Pointer, MD  chlordiazePOXIDE (LIBRIUM) 25 MG capsule 25mg  PO TID x 1D, then 25mg  PO BID X 1D, then 25mg  PO QD X 1D 04/11/19   Lucrezia Starch, MD  famotidine (PEPCID AC) 10 MG chewable tablet Chew 10 mg by mouth as needed for heartburn.    [provider]  gabapentin (NEURONTIN) 100 MG capsule Take 2 capsules (200 mg total) by mouth 3 (three) times daily. 02/08/19 08/07/19  Milinda Pointer, MD  Magnesium Oxide 500 MG CAPS Take 1 capsule (500 mg total) by mouth 2 (two) times daily at 8 am and 10 pm. 02/08/19 02/08/20  Milinda Pointer, MD  metoprolol succinate  (TOPROL-XL) 100 MG 24 hr tablet Take 50 mg by mouth 3 (three) times daily.  07/03/18   [provider]  omeprazole (PRILOSEC OTC) 20 MG tablet Take 20 mg by mouth 2 (two) times daily as needed (heartburn).     [provider]  oxyCODONE (OXY IR/ROXICODONE) 5 MG immediate release tablet Take 1 tablet (5 mg total) by mouth 5 (five) times daily for 10 doses. 02/16/19 02/18/19  Nita Sells, MD  thiamine 100 MG tablet Take 1 tablet (100 mg total) by mouth daily. Patient not taking: Reported on 02/11/2019 08/18/18   Shelly Coss, MD  traZODone (DESYREL) 100 MG tablet Take 1 tablet (100 mg total) by mouth at bedtime as needed. Patient taking differently: Take 50-100 mg by mouth at bedtime as needed for sleep.  08/21/17   Ladell Pier, MD    Allergies    Cozaar [losartan] and Lisinopril  Review of Systems   Review of Systems  Constitutional: Positive for fatigue. Negative for fever.  HENT: Negative for sore throat.   Eyes: Negative for visual disturbance.  Respiratory: Positive for cough (chronic). Negative for shortness of breath.   Cardiovascular: Positive for chest pain.  Gastrointestinal: Positive for abdominal pain. Negative for nausea and vomiting.  Genitourinary: Negative for dysuria.  Musculoskeletal: Positive for back pain.  Skin: Negative for rash.  Neurological: Negative for headaches.    Physical Exam Updated Vital Signs BP (!) 148/81 (BP Location: Right Arm)   Pulse (!) 46   Temp 97.7 F (36.5 C) (Oral)   Resp 16   SpO2 98%   Physical Exam Vitals and nursing note reviewed.  Constitutional:      Appearance: He is well-developed.  HENT:     Head: Normocephalic and atraumatic.  Eyes:     Conjunctiva/sclera: Conjunctivae normal.  Cardiovascular:     Rate and Rhythm: Normal rate and regular rhythm.     Pulses: Normal pulses.     Heart sounds: No murmur.     Comments: Frequent extra beats. Pulmonary:     Effort: Pulmonary effort is normal.  No respiratory distress.     Breath sounds: Normal breath sounds.  Abdominal:     Palpations: Abdomen is soft.     Tenderness: There is abdominal tenderness (upper). There is no guarding or rebound.  Musculoskeletal:        General: No deformity or signs of injury. Normal range of motion.  Cervical back: Neck supple.  Skin:    General: Skin is warm and dry.     Capillary Refill: Capillary refill takes less than 2 seconds.  Neurological:     General: No focal deficit present.     Mental Status: He is alert and oriented to person, place, and time.     Sensory: No sensory deficit.     Motor: No weakness.     ED Results / Procedures / Treatments   Labs (all labs ordered are listed, but only abnormal results are displayed) Labs Reviewed  COMPREHENSIVE METABOLIC PANEL - Abnormal; Notable for the following components:      Result Value   Sodium 133 (*)    Potassium 3.1 (*)    Chloride 95 (*)    CO2 20 (*)    Glucose, Bld 176 (*)    BUN <5 (*)    Calcium 8.3 (*)    Total Protein 6.1 (*)    Anion gap 18 (*)    All other components within normal limits  LACTIC ACID, PLASMA - Abnormal; Notable for the following components:   Lactic Acid, Venous 4.4 (*)    All other components within normal limits  LACTIC ACID, PLASMA - Abnormal; Notable for the following components:   Lactic Acid, Venous 4.5 (*)    All other components within normal limits  BRAIN NATRIURETIC PEPTIDE - Abnormal; Notable for the following components:   B Natriuretic Peptide 198.6 (*)    All other components within normal limits  CBC WITH DIFFERENTIAL/PLATELET - Abnormal; Notable for the following components:   RBC 6.26 (*)    Hemoglobin 17.7 (*)    All other components within normal limits  ETHANOL - Abnormal; Notable for the following components:   Alcohol, Ethyl (B) 58 (*)    All other components within normal limits  SARS CORONAVIRUS 2 (TAT 6-24 HRS)  LIPASE, BLOOD  MAGNESIUM  LACTIC ACID, PLASMA    COMPREHENSIVE METABOLIC PANEL  CBC  TROPONIN I (HIGH SENSITIVITY)  TROPONIN I (HIGH SENSITIVITY)    EKG EKG Interpretation  Date/Time:  Monday April 12 2019 09:34:48 EST Ventricular Rate:  81 PR Interval:    QRS Duration: 115 QT Interval:  452 QTC Calculation: 525 R Axis:   47 Text Interpretation: Sinus rhythm Ventricular bigeminy Nonspecific intraventricular conduction delay No significant change since prior 1/31 Confirmed by Aletta Edouard [phone]) on 04/12/2019 9:45:41 AM   Radiology DG Chest 2 View  Result Date: 04/11/2019 CLINICAL DATA:  Chest pain. EXAM: CHEST - 2 VIEW COMPARISON:  February 11, 2019 FINDINGS: Mild, stable linear scarring and/or atelectasis is seen within the left lung base. There is no evidence of acute infiltrate, pleural effusion or pneumothorax. The heart size and mediastinal contours are within normal limits. A radiopaque fusion plate and screws are seen overlying the lower cervical spine. Degenerative changes seen throughout the thoracic spine. IMPRESSION: No acute cardiopulmonary disease. Electronically Signed   By: Virgina Norfolk M.D.   On: 04/11/2019 18:44   DG Chest Port 1 View  Result Date: 04/12/2019 CLINICAL DATA:  Chest pain and shortness of breath EXAM: PORTABLE CHEST 1 VIEW COMPARISON:  Yesterday FINDINGS: Normal heart size and mediastinal contours. No acute infiltrate or edema. No effusion or pneumothorax. No acute osseous findings. ACDF hardware IMPRESSION: No evidence of active disease. Electronically Signed   By: Monte Fantasia M.D.   On: 04/12/2019 10:54    Procedures .Critical Care Performed by: Hayden Rasmussen, MD Authorized by: Aletta Edouard  C, MD   Critical care provider statement:    Critical care time (minutes):  45   Critical care time was exclusive of:  Separately billable procedures and treating other patients   Critical care was necessary to treat or prevent imminent or life-threatening deterioration of the following  conditions:  Cardiac failure and metabolic crisis   Critical care was time spent personally by me on the following activities:  Discussions with consultants, evaluation of patient's response to treatment, examination of patient, ordering and performing treatments and interventions, ordering and review of laboratory studies, ordering and review of radiographic studies, pulse oximetry, re-evaluation of patient's condition, obtaining history from patient or surrogate, review of old charts and development of treatment plan with patient or surrogate   I assumed direction of critical care for this patient from another provider in my specialty: no     (including critical care time)  Medications Ordered in ED Medications  LORazepam (ATIVAN) tablet 1-4 mg ( Oral See Alternative 04/12/19 1504)    Or  LORazepam (ATIVAN) injection 1-4 mg (1 mg Intravenous Given 04/12/19 1504)  thiamine tablet 100 mg ( Oral See Alternative 04/12/19 1117)    Or  thiamine (B-1) injection 100 mg (100 mg Intravenous Given [id number] [id number])  folic acid (FOLVITE) tablet 1 mg (1 mg Oral Given 04/12/19 1125)  multivitamin with minerals tablet 1 tablet (1 tablet Oral Given 04/12/19 1125)  enoxaparin (LOVENOX) injection 40 mg (40 mg Subcutaneous Given 04/12/19 1458)  baclofen (LIORESAL) tablet 10 mg (10 mg Oral Given 04/12/19 1543)  gabapentin (NEURONTIN) capsule 200 mg (200 mg Oral Given 04/12/19 1543)  magnesium oxide (MAG-OX) tablet 400 mg (has no administration in time range)  metoprolol tartrate (LOPRESSOR) tablet 50 mg (50 mg Oral Given 04/12/19 1543)  traZODone (DESYREL) tablet 100 mg (has no administration in time range)  cholecalciferol (VITAMIN D3) tablet 1,000 Units (1,000 Units Oral Given 04/12/19 1457)  acetaminophen (TYLENOL) tablet 650 mg (has no administration in time range)    Or  acetaminophen (TYLENOL) suppository 650 mg (has no administration in time range)  lactated ringers bolus 1,000 mL (0 mLs Intravenous Stopped 04/12/19 1623)     Followed by  lactated ringers infusion ( Intravenous New Bag/Given 04/12/19 1457)  oxyCODONE (Oxy IR/ROXICODONE) immediate release tablet 5 mg (has no administration in time range)  potassium chloride (KLOR-CON) CR tablet 30 mEq (has no administration in time range)  morphine 4 MG/ML injection 4 mg (4 mg Intravenous Given 04/12/19 0946)  sodium chloride 0.9 % bolus 1,000 mL (0 mLs Intravenous Stopped 04/12/19 1302)  potassium chloride SA (KLOR-CON) CR tablet 20 mEq (20 mEq Oral Given 04/12/19 1125)    ED Course  I have reviewed the triage vital signs and the nursing notes.  Pertinent labs & imaging results that were available during my care of the patient were reviewed by me and considered in my medical decision making (see chart for details).  Clinical Course as of Apr 11 1722  Mon Apr 11, 6752  2212 54 year old male here with chest pain abdominal pain radiating through the back and possible V. tach.  Differential includes arrhythmia, metabolic derangement, ACS, PE, pancreatitis, vascular disease, pneumothorax.   [MB]  1100 Labs coming back and show lactate to be elevated at 4.4.  No white count and no fever.  Likely related to some volume depletion and alcohol withdrawal.  Pulse is in the 70s although hypertensive.  Put him on a CIWA.  Potassium slightly low at 3.1.  Will  order repletion for that.   [MB]  1101 High-sensitivity troponin only 5.  BNP mildly elevated at 198.   [MB]  [id number] Patient's lactate is rising despite IV fluids. No clear source. Have paged Dr. Terrence Dupont cardiology for recommendations regarding his nonsustained V. tach and frequent PVCs. I also talked with the internal medicine team regarding admission.   [MB]  [id number] Discussed with Dr. Terrence Dupont from cardiology.  He said to replete electrolytes and have him abstain from drinking would be the best thing as far as his ventricular ectopy.  He asked that the admitting team consult him if they need further recommendations.   [MB]      Clinical Course User Index [MB] Hayden Rasmussen, MD   MDM Rules/Calculators/A&P                       Final Clinical Impression(s) / ED Diagnoses Final diagnoses:  NSVT (nonsustained ventricular tachycardia) ([place])  Alcohol dependence with uncomplicated withdrawal ([place])  Nonspecific chest pain  Upper abdominal pain    Rx / DC Orders ED Discharge Orders    None       Hayden Rasmussen, MD 04/12/19 1725

## 2019-04-12 NOTE — Progress Notes (Signed)
Received in report that pt fell downstairs and received 2 meals. Pt arrived on unit asking for a dinner, stating that he did not eat at all.  Pt states that he came to hospital yesterday, was d/c'd, then went home and "drank 2 beers", then came back to the hospital. Pt lives with mother, but says he took himself.  Also received in report that pt had turned the bed alarm himself. Pt was observed by this RN and NT turning the bed alarm off. Bed alarm has been going off multiple times - during one episode, pt had taken off his heart monitor wires and o2 wire to finger.

## 2019-04-12 NOTE — ED Notes (Signed)
Dr. Rogene Houston notified of fall, pt is an admit hold in the ED, Dr. Teryl Lucy notified of fall and new pain to L arm/shoulder. IM team will assess patient for injuries and further orders.

## 2019-04-12 NOTE — ED Notes (Signed)
Checked patient temp then showed Dr Melina Copa his ekg patient is resting with nursing at bedside and call bell in reach

## 2019-04-12 NOTE — Progress Notes (Addendum)
Assessment: Called to the bedside to assess the patient after a fall while getting out of bed.  Patient reports not sure how he fell.  Examined the patient.  Notable soft tissue swelling over the lateral mid humerus.  He has minimal to no pain in the left shoulder and normal range of motion/strength.  He is mainly limited by pain in the left humerus.  When using his left arm to hold himself up in the bed or perform strength testing he has pain that radiates down the left humerus.  Based on his examination I do not feel that he has any major trauma to the area involved.  Plan:  -We will order an x-ray of the humerus to evaluate for possible hairline fracture given the focal degree of swelling and pain with weightbearing and given his current mental state -He is currently on baclofen, gabapentin and oxycodone, continue supportive care with ice packs/K pad, PRN NSAIDs

## 2019-04-12 NOTE — ED Notes (Signed)
Admitting team at bedside for re-evaluation

## 2019-04-12 NOTE — ED Notes (Signed)
Pt refused Tylenol at this time, pt states it does not work for him.

## 2019-04-12 NOTE — ED Notes (Signed)
Pt's cardiac monitor noted to be alarming, this RN entered room to find pt had unhooked all monitoring devices, put down siderail. Pt states he needed to urinate, pt assisted to standing position with minimal assistance, pt used urinal and was is now back on stretcher with side rails up for safety. Pt again encouraged to use call bell for assistance instead of lowering side rail himself.  Pt verbalized understanding, pt requesting pain medication, Coke and food.

## 2019-04-12 NOTE — ED Notes (Signed)
Report called to 2W, pt will be going to room 17

## 2019-04-12 NOTE — ED Triage Notes (Addendum)
Pt called out EMS for cp, abdominal pain, sob. Seen for same last night. On ems arrival, pt in vtach. 2 g mag, 300 mg amiodarone, 2 mg versed given PTA with improvement occasional PVC's. Thinks he is withdrawing from alcohol, last drink was a couple of beers last night at 11 pm. Decreased PO intake over the last few days, ran out of his Percocet rx o has been drinking more since Thursday.

## 2019-04-12 NOTE — H&P (Addendum)
Date: 04/12/2019               Patient Name:  Mark [name] MRN: [id number]  DOB: [birth year] Age / Sex: 53 y.o., male   PCP: Patient, No Pcp Per         Medical Service: Internal Medicine Teaching Service         Attending Physician: Dr. Aldine Contes, MD    First Contact: Dr. Court Joy Pager: [phone]  Second Contact: Dr. Sherry Ruffing Pager: [phone]       After Hours (After 5p/  First Contact Pager: [phone]  weekends / holidays): Second Contact Pager: [phone]   Chief Complaint: Chest pain, abdominal pain  History of Present Illness: Mark [name] is a 54 year old male with past medical history of HTN, HLD, opiod use disorder, alcohol use disorder w/ history of withdrawal seizures,  alcohol induced dilated cardiomyopathy, and history of SVT who presents with epigastric pain, back pain, nausea, chest pain, and difficulty breathing. He has been unable to eat anything since Thursday due to nausea..He states he started to feel worse yesterday.He was drinking beer last night to help with his chronic pain because he is out of his oxycodone. No history of pancreatitis. Chest pain is better now and describes it as worse with movement. Denies chest pain brought on with activity. He reports he follows with a cardilogist for alcohol induced cardiomyopathy diagnosed 4-5 years ago, however hasn't seen him recently. He is deconditioned at baseline and becomes SOB with walking long distances. Last BM was this morning, it was liquid, dark brown, denies any melena or vomiting. Denies any recent sick contact, hematochezia , or hematemesis.  .   Meds:  Current Meds  Medication Sig  . albuterol (VENTOLIN HFA) 108 (90 Base) MCG/ACT inhaler Inhale 2 puffs into the lungs every 6 (six) hours as needed for wheezing or shortness of breath.  . baclofen (LIORESAL) 10 MG tablet Take 1 tablet (10 mg total) by mouth 3 (three) times daily. (Patient taking differently: Take 10 mg by mouth 3 (three) times daily as  needed for muscle spasms. )  . Cholecalciferol (VITAMIN D3 PO) Take 1 tablet by mouth daily.  . famotidine (PEPCID AC) 10 MG chewable tablet Chew 10 mg by mouth as needed for heartburn.  . gabapentin (NEURONTIN) 100 MG capsule Take 2 capsules (200 mg total) by mouth 3 (three) times daily.  . Magnesium Oxide 500 MG CAPS Take 1 capsule (500 mg total) by mouth 2 (two) times daily at 8 am and 10 pm. (Patient taking differently: Take 1 capsule by mouth daily. )  . metoprolol tartrate (LOPRESSOR) 50 MG tablet Take 50 mg by mouth 3 (three) times daily.  . traZODone (DESYREL) 100 MG tablet Take 1 tablet (100 mg total) by mouth at bedtime as needed. (Patient taking differently: Take 50-100 mg by mouth at bedtime as needed for sleep. )  . [DISCONTINUED] metoprolol succinate (TOPROL-XL) 50 MG 24 hr tablet Take 50 mg by mouth 3 (three) times daily.      Allergies: Allergies as of 04/12/2019 - Review Complete 04/12/2019  Allergen Reaction Noted  . Cozaar [losartan] Anaphylaxis 02/28/2014  . Lisinopril Anaphylaxis 03/20/2011   Past Medical History:  Diagnosis Date  . Alcohol withdrawal ([place])   . Allergy   . Anxiety Dx 2014  . COPD (chronic obstructive pulmonary disease) ([place])   . Drug abuse ([place])    pt reports opioid dependence due to previous back surgeries  .  Enlarged prostate   . ETOH abuse   . GERD (gastroesophageal reflux disease) Dx 2003  . Headache(784.0)   . Hepatitis   . Hyperlipidemia Dx 2000  . Hypertension Dx 2011  . IBS (irritable bowel syndrome)    alternates constiaption/ diarrhea  . Irregular heart beat   . 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 Mental disorder   . Neuromuscular disorder ([place])    hands hurt   . Neuropathy 06/07/2013  . Seizure due to alcohol withdrawal ([place]) 01/23/2014   Per patient report  . Shortness of breath   . Withdrawal seizures ([place]) 2014   last seizure 2014 etoh with drawal    Family History:  Family History  Problem Relation Age of Onset  . Lung  cancer Father        was a smoker  . Alcohol abuse Father   . Hypertension Mother   . Colon polyps Mother   . Breast cancer Paternal Grandmother   . Alcohol abuse Paternal Grandfather   . Colon cancer Neg Hx   . Rectal cancer Neg Hx   . Stomach cancer Neg Hx      Social History:  Social History   Tobacco Use  . Smoking status: Current Every Day Smoker    Packs/day: 0.50    Years: 30.00    Pack years: 15.00    Types: Cigarettes  . Smokeless tobacco: Never Used  Substance Use Topics  . Alcohol use: Yes    Alcohol/week: 56.0 standard drinks    Types: 56 Cans of beer per week    Comment: Case a beer a day   . Drug use: No    Comment: Opana     Review of Systems: A complete ROS was negative except as per HPI.   Physical Exam: Blood pressure (!) 177/102, pulse 84, temperature 97.7 F (36.5 C), temperature source Oral, resp. rate 20, height 6\' 4"  (1.93 m), weight 102.1 kg, SpO2 98 %.  General: NAD, middle aged male HE: [place]/AT , antiicteric sclera ENT: no erythema or exudate, no congestion, no LAD Cardiovascular: RRR, no mrg Pulmonary : CTAB, no wheezes , rhonchi , rales Abdominal: soft , mild tenderness in epigastric and left up quadrant, bowel sound present Musculoskeletal:  No deformities, hand tremors  Skin: no rashes, dry Neurological: Alert and oriented x3, moving all 4 extremities  Psychiatric/Behavioral: mood normal , affect normal   EKG: personally reviewed my interpretation is  Sinus rhythm with trigeminy.   CXR: personally reviewed my interpretation is no acute cardiopulmonary disease. No effusion, no infiltrates, or no pneumothorax.   Assessment & Plan by Problem: Active Problems:   Withdrawal syndrome ([place])  Mark [name] is a 54 year old male with past medical history of HTN, HLD, opiod use disorder, alcohol use disorder w/ history of withdrawal seizures,  alcohol induced dilated cardiomyopathy, and history of SVT who presents with epigastric pain,  back pain, nausea, chest pain, and difficulty breathing.   #Paroxysmal Ventricular Tachycardia # Alcohol induced dilated cardiomyopathy Initial presenting rhythm was V. tach , treated with amiodarone, Versed, and magnesium. Now with bigeminy/trigeminy. Hypokalemic (3.1) on initial labs, Mg 2.4. History of Cardiomyopathy 2/2 EtOH use.  On metoprolol at home.   P: - Telemetry - BMP   #ETOH used disorder Last drink 1/31. History of withdrawal seizures.  P: -CIWA with Ativan - Thiamine  #AGMA Lactic acid 4.4 > 4.5. No obvious signs of infection. Troponin trended, flat. Likely secondary to hypovelemia in setting of alcohol  use, loose stools and emesis.  P: - IV LR @ 100 cc/hr over 10 hrs - Trend lactic acid  #Chronic Pain Ran out of opioids approximately a week ago, will restart home regimen. Some of patients symptoms could be consistent with opoid withdrawal .  - Continue home PRN Oxycodone 5 mg  Hypokalemia 3.1, 20 meq in ED P: Kdur 30 meq x2    Dispo: Admit patient to Inpatient with expected length of stay greater than 2 midnights.  Signed:  Tamsen Snider, MD PGY1

## 2019-04-12 NOTE — ED Notes (Signed)
Assumed care of patient, upon entry to room with RN from previous shift pt found sitting on floor of room with arms on bed, all linens on floor, CCM off. Beside bed full undisturbed urinal and beverage cup noted next to IV pole.  Pt denies hitting head, pt shaky when assisted to standing position, pt c/o L shoulder pain with small abrasion and golf ball sized swollen area to L triceps. No hemorrhage noted.  Pt placed back in bed with both siderails up, yellow socks and yellow armband placed on patient.  Pt is A & O, pt reoriented to call bell, room phone and urinal all at bedside, pt provided beverage per his request.

## 2019-04-13 DIAGNOSIS — E876 Hypokalemia: Secondary | ICD-10-CM

## 2019-04-13 DIAGNOSIS — Z79891 Long term (current) use of opiate analgesic: Secondary | ICD-10-CM

## 2019-04-13 DIAGNOSIS — I420 Dilated cardiomyopathy: Secondary | ICD-10-CM

## 2019-04-13 DIAGNOSIS — I10 Essential (primary) hypertension: Secondary | ICD-10-CM

## 2019-04-13 DIAGNOSIS — E785 Hyperlipidemia, unspecified: Secondary | ICD-10-CM

## 2019-04-13 DIAGNOSIS — G8929 Other chronic pain: Secondary | ICD-10-CM

## 2019-04-13 DIAGNOSIS — Z87891 Personal history of nicotine dependence: Secondary | ICD-10-CM

## 2019-04-13 DIAGNOSIS — Z5329 Procedure and treatment not carried out because of patient's decision for other reasons: Secondary | ICD-10-CM

## 2019-04-13 DIAGNOSIS — Z888 Allergy status to other drugs, medicaments and biological substances status: Secondary | ICD-10-CM

## 2019-04-13 DIAGNOSIS — I472 Ventricular tachycardia: Principal | ICD-10-CM

## 2019-04-13 DIAGNOSIS — F10939 Alcohol use, unspecified with withdrawal, unspecified: Secondary | ICD-10-CM

## 2019-04-13 DIAGNOSIS — Z79899 Other long term (current) drug therapy: Secondary | ICD-10-CM

## 2019-04-13 LAB — COMPREHENSIVE METABOLIC PANEL
ALT: 17 U/L (ref 0–44)
AST: 20 U/L (ref 15–41)
Albumin: 3.4 g/dL — ABNORMAL LOW (ref 3.5–5.0)
Alkaline Phosphatase: 107 U/L (ref 38–126)
Anion gap: 12 (ref 5–15)
BUN: <5 mg/dL — ABNORMAL LOW (ref 6–20)
CO2: 25 mmol/L (ref 22–32)
Calcium: 8.9 mg/dL (ref 8.9–10.3)
Chloride: 101 mmol/L (ref 98–111)
Creatinine, Ser: 0.8 mg/dL (ref 0.61–1.24)
GFR calc Af Amer: >60 mL/min (ref >60)
GFR calc non Af Amer: >60 mL/min (ref >60)
Glucose, Bld: 110 mg/dL — ABNORMAL HIGH (ref 70–99)
Potassium: 3.8 mmol/L (ref 3.5–5.1)
Sodium: 138 mmol/L (ref 135–145)
Total Bilirubin: 2.3 mg/dL — ABNORMAL HIGH (ref 0.3–1.2)
Total Protein: 5.8 g/dL — ABNORMAL LOW (ref 6.5–8.1)

## 2019-04-13 LAB — CBC
HCT: 47.9 % (ref 39.0–52.0)
Hemoglobin: 16.4 g/dL (ref 13.0–17.0)
MCH: 28.5 pg (ref 26.0–34.0)
MCHC: 34.2 g/dL (ref 30.0–36.0)
MCV: 83.3 fL (ref 80.0–100.0)
Platelets: 259 10*3/uL (ref 150–400)
RBC: 5.75 MIL/uL (ref 4.22–5.81)
RDW: 13.4 % (ref 11.5–15.5)
WBC: 8.7 10*3/uL (ref 4.0–10.5)
nRBC: 0 % (ref 0.0–0.2)

## 2019-04-13 LAB — PHOSPHORUS: Phosphorus: 3.2 mg/dL (ref 2.5–4.6)

## 2019-04-13 LAB — LACTIC ACID, PLASMA
Lactic Acid, Venous: 1.6 mmol/L (ref 0.5–1.9)
Lactic Acid, Venous: 2 mmol/L (ref 0.5–1.9)

## 2019-04-13 MED ORDER — PROMETHAZINE HCL 25 MG PO TABS
12.5000 mg | ORAL_TABLET | Freq: Once | ORAL | Status: DC
  Filled 2019-04-13: qty 1

## 2019-04-13 MED ORDER — BACLOFEN 10 MG PO TABS
10.0000 mg | ORAL_TABLET | Freq: Three times a day (TID) | ORAL | Status: DC
  Administered 2019-04-13: 10 mg via ORAL
  Filled 2019-04-13: qty 1

## 2019-04-13 MED ORDER — TRAZODONE HCL 100 MG PO TABS: 100.0000 mg | ORAL_TABLET | Freq: Once | ORAL | Status: DC

## 2019-04-13 MED ORDER — BACLOFEN 10 MG PO TABS: 10.0000 mg | ORAL_TABLET | Freq: Three times a day (TID) | ORAL | Status: DC

## 2019-04-13 MED ORDER — NICOTINE 21 MG/24HR TD PT24: 21.0000 mg | MEDICATED_PATCH | Freq: Every day | TRANSDERMAL | Status: DC

## 2019-04-13 NOTE — Progress Notes (Signed)
Patient is crying in the room. Reports unbearable pain. Verbalized emotional distress and physical distress. Patient states "I just need this crazy ride to end."   This RN asked patient if he has any suicidal thoughts. Patient stated no he does not have suicidal thoughts nor does he have a plan for self harm. Patient given Ativan 2 mg and scheduled meds to help alleviate pain and anxiety. Patient also given ice cream.   1436 IMTS paged.  1440 Patient reports he feels better emotionally but still has pain.

## 2019-04-13 NOTE — Progress Notes (Signed)
Pt has been refusing tos tay in bed - bed alarm has been going off multiple times an hour. Pt found walking around each time, ripping off heart monitor wires, BP line, and O2 line. Pt threatening to leave AMA, saying that we are not doing anything for him. Pt is very confused and forgetful - does not remember receiving Librium x2, and Ativan 4mg  IV x1.

## 2019-04-13 NOTE — Progress Notes (Signed)
Pt this shift has continually taken off leads, wires, and took both PIV's out and threw them in the room. New PIV put in. Pt confused - did not remember when or why he came to the hospital.

## 2019-04-13 NOTE — Progress Notes (Addendum)
   Subjective: O/N: patient attempting to leave, librium dose was increased  Reports no concerns on exam. Aware he is here with alcohol withdraw. Confused earlier this morning per chart. Agreeable to continued monitoring and treatment  Objective:  Vital signs in last 24 hours: Vitals:   04/12/19 2012 04/12/19 2118 04/13/19 0103 04/13/19 0427  BP: (!) 149/85 (!) 166/83 (!) 157/106 (!) 142/99  Pulse: 80 81 86 82  Resp: 20 19 20 16   Temp:  99.4 F (37.4 C) 98.7 F (37.1 C) 97.6 F (36.4 C)  TempSrc:    Oral  SpO2: 97% 100% 95% 97%  Weight:      Height:       General: NAD, sitting on side of bed eating breakfast. AOx3 Cardiovascular: Tachycardic, regular rhythm, no murmur rubs or gallops  Pulmonary : CTAB, no wheezes rales or rhonchi Abdominal: Mild tenderness, normal bowel sounds  Psychiatric/Behavioral: mood normal, cooperative   CBC Latest Ref Rng & Units 04/13/2019 04/12/2019 04/11/2019  WBC 4.0 - 10.5 K/uL 8.7 8.7 9.0  Hemoglobin 13.0 - 17.0 g/dL 16.4 17.7(H) 20.0(H)  Hematocrit 39.0 - 52.0 % 47.9 51.8 57.9(H)  Platelets 150 - 400 K/uL 259 317 367   BMP Latest Ref Rng & Units 04/13/2019 04/12/2019 04/11/2019  Glucose 70 - 99 mg/dL 110(H) 176(H) 122(H)  BUN 6 - 20 mg/dL <5(L) <5(L) <5(L)  Creatinine 0.61 - 1.24 mg/dL 0.80 0.79 0.71  BUN/Creat Ratio 9 - 20 - - -  Sodium 135 - 145 mmol/L 138 133(L) 132(L)  Potassium 3.5 - 5.1 mmol/L 3.8 3.1(L) 4.0  Chloride 98 - 111 mmol/L 101 95(L) 97(L)  CO2 22 - 32 mmol/L 25 20(L) 15(L)  Calcium 8.9 - 10.3 mg/dL 8.9 8.3(L) 9.0    Intake/Output Summary (Last 24 hours) at 04/13/2019 [id number] Last data filed at 04/13/2019 [id number] Gross per 24 hour  Intake 3000 ml  Output 3325 ml  Net -325 ml     Assessment/Plan:  Active Problems:   Withdrawal syndrome ([name])  Mark [name] is a 54 year old male with past medical history of HTN, HLD, opioid use disorder, alcohol use disorder with history of withdrawal seizures, alcohol induced cardiomyopathy  history of SVT here for polysubstance withdrawal.   #Alcohol use disorder History of multiple admission for alcohol use, also history of withdrawal seizures.  P: -Librium 25 mg 4 times daily -CIWA w/ ativan  #Chronic Pain - History of chronic pain . Followed by Dr. Earleen Newport, pain physician. P: - continue Gabapentin 200mg  TID  #Paroxysmal Ventricular Tachycardia  #Alcohol induced cardiomyopathy  - No Vtach on seen on review of EKG's .Hypokalemic on presentation.  P: - Continue to monitor on Tele - BMP   Prior to Admission Living Arrangement: home Anticipated Discharge Location: home Barriers to Discharge: clincal improvement Dispo: Anticipated discharge in approximately 1-2 day(s).   Tamsen Snider, MD PGY1

## 2019-04-13 NOTE — TOC Progression Note (Signed)
Transition of Care [facility]) - Progression Note    Patient Details  Name: Mark [name] MRN: [id number] Date of Birth: November 25, 1965  Transition of Care [facility]) CM/SW [place], [place] Work Phone Number: 04/13/2019, 2:25 PM  Clinical Narrative:     MSW intern spoke with pt's mother to discuss her concerns with her sons substance abuse. She noted that her son has lived with her for 9 years and has struggled with alcohol and pills off and on in that time. She stated that the pt deals with pain and works with Dr. Renato Shin at the pain clinic. He uses alcohol to help with pain that the oxycodon does not help. He is unemployed and unable to get disability.  She asked about options for inpatient rehabilitation, but pt has no insurance, and the payments would be a problem. Resources were given for halfway house options and she would like that information put into his AVS when he leaves. She stated that she doesn't think he will live long if he keeps doing what he is doing. SW will continue to follow.  [facility] [place]. [place]        Expected Discharge Plan and Services                                                 Social Determinants of Health (SDOH) Interventions    Readmission Risk Interventions No flowsheet data found.

## 2019-04-13 NOTE — Social Work (Signed)
CSW acknowledging consult for PCP/substance abuse resources. At this time, patient is confused and unable to appropriate participate in assessment. CSW to follow to complete assessment and offer resources when patient is able to participate.  Laveda Abbe, [place] Clinical Social Worker [phone]

## 2019-04-13 NOTE — Progress Notes (Signed)
Went to bedside to evaluate patient.  Patient asleep and resting comfortably with normal respiratory rate.  Nurse reported some agitation before my arrival.  I suspect he was under dosed earlier today given his agitation and fall, we spoke with the day team and added librium earlier in the evening and added on an extra 25mg  dose as well.  We will continue the librium and PRN ativan per CIWA protocol and monitor him closely.

## 2019-04-13 NOTE — Progress Notes (Signed)
Patient refused medications and did not want to speak to a doctor regarding anymore care. Patient informed about leaving AMA and advise against. He verbalized understanding and still wishes to leave. Patient signed AMA form. IMTS made aware.

## 2019-04-13 NOTE — Progress Notes (Signed)
0630 paged by RN that patient wanted to leave AMA.  Spoke with patient at bedside. Patient states that he cannot get a good nights rest and wants to have a cigarette. Offered patient nicotine patch and tramadol which patient declined. We discussed the risk of alcohol withdrawal symptoms including seizures. We also discussed the risk of potentially lethal arrhythmias given his past telemetry and electrolyte changes. Patient states that he understands these risks and that this decision could result in his death.  Patient eventually agreed to stay after being provided with nicotine patch, tramadol, [employer] sandwich.   Was paged by RN at [place] that patient had changed his mind again, was dressed and leaving AMA. Called back and patient was already off the floor.  Jeanmarie Hubert, MD 04/13/2019, 7:45 PM Pager: [phone]

## 2019-04-13 NOTE — Progress Notes (Signed)
IMTS paged - pt has tried to leave x2 now, last time has said he "needs to just go for a walk" - explained to pt what medications he received, possible side effects, and that it would be in a very good interest for him to stay here at the hospital. Pt has attempted to leave now consistently a few minutes after receiving each dose of medication.

## 2019-04-14 NOTE — Discharge Summary (Signed)
Name: Mark [name] MRN: [id number] DOB: [birth year] 54 y.o. PCP: Patient, No Pcp Per  Date of Admission: 04/12/2019  9:28 AM Date of Discharge: 04/13/2019 Attending Physician: Mark Contes, MD  Discharge Diagnosis: 1. Alcohol withdrawal 2. Nonsustained Ventricular tachycardia  Discharge Medications: Allergies as of 04/13/2019      Reactions   Cozaar [losartan] Anaphylaxis   Lisinopril Anaphylaxis      Medication List    ASK your doctor about these medications   albuterol 108 (90 Base) MCG/ACT inhaler Commonly known as: Ventolin HFA Inhale 2 puffs into the lungs every 6 (six) hours as needed for wheezing or shortness of breath.   amLODipine 10 MG tablet Commonly known as: NORVASC Take 1 tablet (10 mg total) by mouth daily for 30 days.   baclofen 10 MG tablet Commonly known as: LIORESAL Take 1 tablet (10 mg total) by mouth 3 (three) times daily.   chlordiazePOXIDE 25 MG capsule Commonly known as: LIBRIUM 25mg  PO TID x 1D, then 25mg  PO BID X 1D, then 25mg  PO QD X 1D   famotidine 10 MG chewable tablet Commonly known as: PEPCID AC Chew 10 mg by mouth as needed for heartburn.   gabapentin 100 MG capsule Commonly known as: Neurontin Take 2 capsules (200 mg total) by mouth 3 (three) times daily.   Magnesium Oxide 500 MG Caps Take 1 capsule (500 mg total) by mouth 2 (two) times daily at 8 am and 10 pm.   metoprolol tartrate 50 MG tablet Commonly known as: LOPRESSOR Take 50 mg by mouth 3 (three) times daily.   oxyCODONE 5 MG immediate release tablet Commonly known as: Oxy IR/ROXICODONE Take 1 tablet (5 mg total) by mouth 5 (five) times daily for 10 doses.   thiamine 100 MG tablet Take 1 tablet (100 mg total) by mouth daily.   traZODone 100 MG tablet Commonly known as: DESYREL Take 1 tablet (100 mg total) by mouth at bedtime as needed.   VITAMIN D3 PO Take 1 tablet by mouth daily.       Disposition and follow-up:   Mark [name] was discharged from  [facility] in Stable condition.  At the hospital follow up visit please address:  1. Alcohol withdrawal     Chronic Pain  Patient left AMA. Appears patient has a pattern of leaving AMA and continues to struggle with addiction. Appears patient ran out of oxycodone prescription early and says he is self medicating by drinking more alcohol. Started patient on outpatient prescriptions, including oxycodone 5 mg while here.            Social work planned on giving the following contact info on discharge if patient is interested:       [facility] [place]. [place]   [phone]  2.  Labs / imaging needed at time of follow-up: n/a  3.  Pending labs/ test needing follow-up: n/a  Follow-up Appointments:   Hospital Course by problem list: 1.  #Alcohol use disorder Admit on 2/1. Started on CIWA w/ ativan and Librium 25 mg 4 times daily. Patient had some confusion overnight, but AOx4 on morning rounds. In the evening on 2/2 patient left AMA. MD on call talked to patient bedside and discussed risk as follows: Risk of alcohol withdrawal symptoms including seizures. Also the risk of potentially lethal arrhythmias given his past telemetry and electrolyte changes. Patient states that he understands these risks and that this decision could  result in his death. Inially agreed with MD to stay, however left shortly after.   #Chronic Pain - History of chronic pain . Followed by Dr. Earleen Newport, pain physician.Continue Gabapentin  Baclofen, Oxy 5 mg q 4hrs   #Paroxysmal Ventricular Tachycardia  #Alcohol induced cardiomyopathy  - No Vtach on seen on review of EKG's this admission, bigeminy and trigeminy noted .Hypokalemic on presentation. Replete K. Mg nl. Phos nl. Continued to monitor on Tele.   Discharge Vitals:   BP (!) 168/105 (BP Location: Left Arm)   Pulse 100   Temp 98.1 F (36.7 C) (Oral)   Resp 20   Ht 6\' 4"  (1.93 m)   Wt 102.1 kg    SpO2 96%   BMI 27.39 kg/m   Pertinent Labs, Studies, and Procedures:  CBC Latest Ref Rng & Units 04/13/2019 04/12/2019 04/11/2019  WBC 4.0 - 10.5 K/uL 8.7 8.7 9.0  Hemoglobin 13.0 - 17.0 g/dL 16.4 17.7(H) 20.0(H)  Hematocrit 39.0 - 52.0 % 47.9 51.8 57.9(H)  Platelets 150 - 400 K/uL 259 317 367   BMP Latest Ref Rng & Units 04/13/2019 04/12/2019 04/11/2019  Glucose 70 - 99 mg/dL 110(H) 176(H) 122(H)  BUN 6 - 20 mg/dL <5(L) <5(L) <5(L)  Creatinine 0.61 - 1.24 mg/dL 0.80 0.79 0.71  BUN/Creat Ratio 9 - 20 - - -  Sodium 135 - 145 mmol/L 138 133(L) 132(L)  Potassium 3.5 - 5.1 mmol/L 3.8 3.1(L) 4.0  Chloride 98 - 111 mmol/L 101 95(L) 97(L)  CO2 22 - 32 mmol/L 25 20(L) 15(L)  Calcium 8.9 - 10.3 mg/dL 8.9 8.3(L) 9.0   CLINICAL DATA:  Chest pain and shortness of breath  EXAM: PORTABLE CHEST 1 VIEW  COMPARISON:  Yesterday  FINDINGS: Normal heart size and mediastinal contours. No acute infiltrate or edema. No effusion or pneumothorax. No acute osseous findings. ACDF hardware  IMPRESSION: No evidence of active disease.  CLINICAL DATA:  Recent fall with left arm pain, initial encounter  EXAM: LEFT HUMERUS - 2+ VIEW  COMPARISON:  None.  FINDINGS: There is no evidence of fracture or other focal bone lesions. Soft tissues are unremarkable.  IMPRESSION: No acute abnormality noted.  Discharge Instructions: Patient left AMA   Signed: Madalyn Rob, MD 04/14/2019, 6:06 AM

## 2019-04-15 MED FILL — METOPROLOL TARTRATE 50 MG T: 50 | 30 days supply | Qty: 90 | Fill #0

## 2019-04-15 MED FILL — GABAPENTIN 100 MG CAPSULE: 100 | 30 days supply | Qty: 180 | Fill #2

## 2019-04-17 MED FILL — oxyCODONE HCL 5 MG TABS: 5 | 30 days supply | Qty: 150 | Fill #0

## 2019-05-11 ENCOUNTER — Other Ambulatory Visit: Payer: Self-pay | Admitting: Pain Medicine

## 2019-05-11 DIAGNOSIS — G894 Chronic pain syndrome: Secondary | ICD-10-CM

## 2019-05-11 MED FILL — LORazepam 1 MG TABS: 1 | 9 days supply | Qty: 20 | Fill #0

## 2019-05-11 MED FILL — METOPROLOL TARTRATE 50 MG T: 50 | 30 days supply | Qty: 90 | Fill #1

## 2019-05-11 MED FILL — GABAPENTIN 100 MG CAPSULE: 100 | 30 days supply | Qty: 180 | Fill #3

## 2019-05-13 ENCOUNTER — Encounter: Payer: Self-pay | Admitting: Pain Medicine

## 2019-05-13 NOTE — Progress Notes (Signed)
Patient: Mark [name]  Service Category: E/M  Provider: [name], MD  DOB: 1965-05-21  DOS: 05/17/2019  Location: Office  MRN: [id number]  Setting: Ambulatory outpatient  Referring Provider: Ladell Pier, MD  Type: Established Patient  Specialty: Interventional Pain Management  PCP: Patient, No Pcp Per  Location: Remote location  Delivery: TeleHealth     Virtual Encounter - Pain Management PROVIDER NOTE: Information contained herein reflects review and annotations entered in association with encounter. Interpretation of such information and data should be left to medically-trained personnel. Information provided to patient can be located elsewhere in the medical record under "Patient Instructions". Document created using STT-dictation technology, any transcriptional errors that may result from process are unintentional.    Contact & Pharmacy Preferred: [phone] Home: [phone] (home) Mobile: [phone] (mobile) E-mail: No e-mail address on record  [address], [place] [address] Phone: [phone] Fax: [phone]   Pre-screening  Mark [name] offered "in-person" vs "virtual" encounter. He indicated preferring virtual for this encounter.   Reason COVID-19*  Social distancing based on CDC and AMA recommendations.   I contacted Mark [name] on 05/17/2019 via telephone.      I clearly identified myself as [name], MD. I verified that I was speaking with the correct person using two identifiers (Name: Mark [name], and date of birth: [birth year]).  Consent I sought verbal advanced consent from Mark [name] for virtual visit interactions. I informed Mark [name] of possible security and privacy concerns, risks, and limitations associated with providing "not-in-person" medical evaluation and management services. I also informed Mark [name] of the availability of  "in-person" appointments. Finally, I informed him that there would be a charge for the virtual visit and that he could be  personally, fully or partially, financially responsible for it. Mark [name] expressed understanding and agreed to proceed.   Historic Elements   Mark [name] is a 54 y.o. year old, male patient evaluated today after his last contact with our practice on 05/11/2019. Mark [name]  has a past medical history of Alcohol withdrawal ([place]), Allergy, Anxiety (Dx 2014), COPD (chronic obstructive pulmonary disease) ([place]), Drug abuse ([place]), Enlarged prostate, ETOH abuse, GERD (gastroesophageal reflux disease) (Dx 2003), Headache(784.0), Hepatitis, Hyperlipidemia (Dx 2000), Hypertension (Dx 2011), IBS (irritable bowel syndrome), Irregular heart beat, Long Q-T syndrome (01/23/2014), Mental disorder, Neuromuscular disorder ([place]), Neuropathy (06/07/2013), Seizure due to alcohol withdrawal ([place]) (01/23/2014), Shortness of breath, and Withdrawal seizures ([place]) (2014). He also  has a past surgical history that includes Back surgery (1995, 1999); Neck surgery (2000); Nasal sinus surgery; left heart catheterization with coronary angiogram (N/A, 01/13/2014); Subacromial decompression (Right, 11/16/2014); Nissen fundoplication; 24 hour ph study (N/A, 01/16/2015); Upper gastrointestinal endoscopy; Colonoscopy (1998); and Shoulder surgery (Right). Mark [name] has a current medication list which includes the following prescription(s): albuterol, baclofen, cholecalciferol, famotidine, gabapentin, magnesium oxide, metoprolol tartrate, trazodone, lorazepam, naproxen sodium, and oxycodone, and the following Facility-Administered Medications: baclofen, gabapentin, ibuprofen (ADVIL) tablet 600 mg, lorazepam **OR** lorazepam, [START ON 05/19/2019] lorazepam **OR** [START ON 05/19/2019] lorazepam, metoprolol tartrate, oxycodone, sodium chloride flush, thiamine **OR** thiamine, and trazodone. He  reports that he has been  smoking cigarettes. He has a 15.00 pack-year smoking history. He has never used smokeless tobacco. He reports current alcohol use of about 56.0 standard drinks of alcohol per week. He reports that he does not use drugs. Mark [name] is allergic to cozaar [losartan]; librium [  chlordiazepoxide]; lisinopril; and tylenol [acetaminophen].   HPI  Today, he is being contacted for medication management. Recently hospitalized due to alcohol withdrawal. My last oxycodone prescription should have lasted from 04/17/19 until 05/15/19. However, he has been taking more than prescribed and he got a prescription written on 02/14/19 by Dr.Samtami, and filled on the same date.  Today I called Mark [name] and I confronted him with this situation and he indicated that he has been doubling up on his pain medicine, despite the fact that we told him not to.  In addition, he has been admitted to the ED with alcohol withdrawal symptoms, which can only happen if he has been using alcohol in the rather frequent basis, despite the fact that he tells me that he does not drink alcohol when he is taking pain medicine.  In addition, there is an admission to the ED where the diagnosis was suicidal ideations.  With this in mind, today I have personally informed the patient that I will no longer be prescribing any opioid analgesic pain medications, especially because he occasionally will also consume benzodiazepines increase in the risk of respiratory depression and death.  I did keep the doors open for the possibility of doing interventional therapies, but I will no longer be prescribing any opioids of any kind.  In view of the information that I have just given Mark [name], he has requested that we do a referral to the [place] [facility] pain management department.  Apparently he had spoken to somebody by the name of Rollene Fare.  [phone], extension [phone].  Fax number: [phone].  Today I also asked him if he was sure that he wanted  me to do the referral since this is something that is usually done by the PCP.  It was at this time that he also confessed not having a PCP, which is a requirement to be a patient of this clinic.  He requested that this referral be sent as a request for a second opinion.  I asked him what the purpose of that was since to me it does not matter whether or not somebody else comes with the opinion that he may need higher doses of pain medicine, I will simply not go with that recommendation.  Today I make sure that he understood that that was the case and that there will be no way for anyone else to reverse today's decision.  He indicated having understood.  Pharmacotherapy Assessment  Analgesic: No further opioid analgesics prescribed by our practice. VERY HIGH RISK. Multiple violations to our medication agreement.  Continues to abuse alcohol.  Noncompliant with medication rules.  Takes more medication than prescribed.  Runs out of medications early.  Does not bring medications for pill counts. NO MORE OPIOIDS from our practice. MME/day: 0 mg/day.   Monitoring: [place] PMP: PDMP reviewed during this encounter.       Pharmacotherapy: No side-effects or adverse reactions reported. Compliance: No problems identified. Effectiveness: Clinically acceptable. Plan: Refer to "POC".  UDS:  Summary  Date Value Ref Range Status  04/20/2018 FINAL  Final    Comment:    ==================================================================== TOXASSURE SELECT 13 (MW) ==================================================================== Test                             Result       Flag       Units Drug Present and Declared for Prescription Verification   Tramadol                       >  4274        EXPECTED   ng/mg creat   O-Desmethyltramadol            >4274        EXPECTED   ng/mg creat   N-Desmethyltramadol            1461         EXPECTED   ng/mg creat    Source of tramadol is a prescription medication.     O-desmethyltramadol and N-desmethyltramadol are expected    metabolites of tramadol. ==================================================================== Test                      Result    Flag   Units      Ref Range   Creatinine              117              mg/dL      >=20 ==================================================================== Declared Medications:  The flagging and interpretation on this report are based on the  following declared medications.  Unexpected results may arise from  inaccuracies in the declared medications.  **Note: The testing scope of this panel includes these medications:  Tramadol  **Note: The testing scope of this panel does not include following  reported medications:  Albuterol  Cyclobenzaprine  Famotidine  Gabapentin  Metoprolol  Omeprazole  Promethazine  Trazodone ==================================================================== For clinical consultation, please call [phone]. ====================================================================    Laboratory Chemistry Profile   Renal Lab Results  Component Value Date   BUN <5 (L) 05/16/2019   CREATININE 0.75 05/16/2019   LABCREA 22.92 08/15/2018   BCR 12 04/09/2017   GFRAA >60 05/16/2019   GFRNONAA >60 05/16/2019    Hepatic Lab Results  Component Value Date   AST 44 (H) 05/16/2019   ALT 41 05/16/2019   ALBUMIN 3.8 05/16/2019   ALKPHOS 124 05/16/2019   HCVAB <0.1 08/16/2018   AMYLASE 26 12/28/2012   LIPASE 30 04/12/2019   AMMONIA 27 06/27/2010    Electrolytes Lab Results  Component Value Date   NA 129 (L) 05/16/2019   K 3.7 05/16/2019   CL 93 (L) 05/16/2019   CALCIUM 9.1 05/16/2019   MG 2.4 04/12/2019   PHOS 3.2 04/13/2019    Bone Lab Results  Component Value Date   VD25OH 21 (L) 07/04/2014   25OHVITD1 35 03/18/2016   25OHVITD2 1.0 03/18/2016   25OHVITD3 34 03/18/2016   TESTOFREE 78.9 09/16/2014   TESTOSTERONE 365 09/16/2014    Inflammation (CRP:  Acute Phase) (ESR: Chronic Phase) Lab Results  Component Value Date   CRP 0.9 03/18/2016   ESRSEDRATE 2 03/18/2016   LATICACIDVEN 1.6 04/13/2019      Note: Above Lab results reviewed.  Imaging  CT ABDOMEN PELVIS W CONTRAST CLINICAL DATA:  Diffuse abdominal pain, nausea and vomiting. Ongoing for weeks.  EXAM: CT ABDOMEN AND PELVIS WITH CONTRAST  TECHNIQUE: Multidetector CT imaging of the abdomen and pelvis was performed using the standard protocol following bolus administration of intravenous contrast.  CONTRAST:  148m OMNIPAQUE IOHEXOL 300 MG/ML  SOLN  COMPARISON:  CT abdomen pelvis 09/08/2017  FINDINGS: Lower chest: Mild atelectasis in the lung bases.  Hepatobiliary: No focal liver abnormality is seen. No gallstones, gallbladder wall thickening, or biliary dilatation.  Pancreas: Unremarkable. No pancreatic ductal dilatation or surrounding inflammatory changes.  Spleen: Normal in size without focal abnormality.  Adrenals/Urinary Tract: Adrenal glands are unremarkable. Kidneys are normal, without renal calculi, focal  lesion, or hydronephrosis. Bladder is unremarkable.  Stomach/Bowel: Stomach is within normal limits. Appendix appears normal. No evidence of bowel wall thickening, distention, or inflammatory changes.  Vascular/Lymphatic: Mild aortoiliac atherosclerotic disease without aneurysm. No lymphadenopathy.  Reproductive: Prostate is unremarkable.  Other: No abdominal wall hernia or abnormality. No abdominopelvic ascites.  Musculoskeletal: Chronic bilateral femoral head AVN. Lower lumbar spine interbody fusion. No acute osseous abnormality.  IMPRESSION: 1. No acute findings in the abdomen or pelvis to explain the patient's symptoms. 2. Chronic bilateral femoral head AVN.  Aortic Atherosclerosis (ICD10-I70.0).  Electronically Signed   By: Audie Pinto M.D.   On: 05/16/2019 18:17 DG Chest 2 View CLINICAL DATA:  Chest pain and shortness of  breath.  EXAM: CHEST - 2 VIEW  COMPARISON:  04/12/2019  FINDINGS: The heart size and mediastinal contours are within normal limits. Both lungs are clear. Cervical spine fusion hardware again seen.  IMPRESSION: No active cardiopulmonary disease.  Electronically Signed   By: Marlaine Hind M.D.   On: 05/16/2019 14:48  Assessment  The primary encounter diagnosis was Chronic pain syndrome. 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  Plan of Care  Problem-specific:  Pain management contract broken The patient was personally informed on 05/17/2019 that we will not be prescribing any further opioid analgesics due to his medication agreement violations.  Mark [name] has a current medication list which includes the following long-term medication(s): albuterol, baclofen, gabapentin, magnesium oxide, trazodone, and oxycodone.  Pharmacotherapy (Medications Ordered): No orders of the defined types were placed in this encounter.  Orders:  Orders Placed This Encounter  Procedures  . Ambulatory referral to Pain Clinic    Referral Priority:   Routine    Referral Type:   Consultation    Referral Reason:   Specialty Services Required    Requested Specialty:   Pain Medicine    Number of Visits Requested:   1  . Ambulatory referral to Chemical Dependency    Referral Priority:   Routine    Referral Type:   Psychiatric    Referral Reason:   Specialty Services Required    Requested Specialty:   Addiction Medicine    Number of Visits Requested:   1   Follow-up  plan:   Return if symptoms worsen or fail to improve, for No further opioid analgesic pharmachotherapy.      Interventional management options:  Considering:   Lower extremity EMG/PNCV. (Radiculopathy versus peripheral neuropathy) Upper extremity EMG/PNCV. Possibleright-sided L3 + L4 Lumbarsympathetic RFA Possible Racz procedure Diagnostic CESI  Diagnostic bilateral cervical facet block  Possible bilateral cervical facet RFA Possible bilateral lumbar facet RFA Diagnostic right IA shoulder joint injection Diagnostic right suprascapular NB Possibleright suprascapular RFA Diagnostic bilateral IA knee joint injection Possible bilateral series of 5 [place] knee injections Diagnostic bilateral Genicular NB Possible bilateral Genicular RFA Diagnostic bilateral greater occipital NB Possible bilateral greater occipital nerve RFA Diagnostic bilateral C2+TON NB Possible bilateral C2 + TON RFA   Palliative PRN treatment(s):   Palliative right IA hip joint injection #2  Diagnostic right lumbar facet block #3  Diagnostic left lumbar facet block #2  Palliative caudal ESI +  epidurogram #3  Palliative right lumbar sympathetic block #3  Palliative right lumbar sympathetic RFA #2 (last done 12/26/2016)  Diagnostic right SI joint block #2     Recent Visits No visits were found meeting these conditions.  Showing recent visits within past 90 days and meeting all other requirements   Today's Visits Date Type Provider Dept  05/17/19 Telemedicine Milinda Pointer, MD [facility]  Showing today's visits and meeting all other requirements   Future Appointments No visits were found meeting these conditions.  Showing future appointments within next 90 days and meeting all other requirements   I discussed the assessment and treatment plan with the patient. The patient was provided an opportunity to ask questions and all were answered. The patient agreed with the plan and  demonstrated an understanding of the instructions.  Patient advised to call back or seek an in-person evaluation if the symptoms or condition worsens.  Duration of encounter: 15 minutes.  Note by: [name], MD Date: 05/17/2019; Time: 1:57 PM

## 2019-05-16 ENCOUNTER — Emergency Department (HOSPITAL_COMMUNITY)
Admission: EM | Admit: 2019-05-16 | Discharge: 2019-05-17 | Disposition: A | Discharge status: Home / Self Care | Payer: Self-pay | Attending: Emergency Medicine | Admitting: Emergency Medicine

## 2019-05-16 ENCOUNTER — Emergency Department (HOSPITAL_COMMUNITY): Payer: Self-pay

## 2019-05-16 ENCOUNTER — Encounter (HOSPITAL_COMMUNITY): Payer: Self-pay | Admitting: Emergency Medicine

## 2019-05-16 ENCOUNTER — Other Ambulatory Visit: Payer: Self-pay

## 2019-05-16 DIAGNOSIS — Y906 Blood alcohol level of 120-199 mg/100 ml: Secondary | ICD-10-CM | POA: Insufficient documentation

## 2019-05-16 DIAGNOSIS — F17210 Nicotine dependence, cigarettes, uncomplicated: Secondary | ICD-10-CM | POA: Insufficient documentation

## 2019-05-16 DIAGNOSIS — J449 Chronic obstructive pulmonary disease, unspecified: Secondary | ICD-10-CM | POA: Insufficient documentation

## 2019-05-16 DIAGNOSIS — I10 Essential (primary) hypertension: Secondary | ICD-10-CM | POA: Insufficient documentation

## 2019-05-16 DIAGNOSIS — R45851 Suicidal ideations: Secondary | ICD-10-CM | POA: Insufficient documentation

## 2019-05-16 DIAGNOSIS — R109 Unspecified abdominal pain: Secondary | ICD-10-CM | POA: Insufficient documentation

## 2019-05-16 DIAGNOSIS — Z79899 Other long term (current) drug therapy: Secondary | ICD-10-CM | POA: Insufficient documentation

## 2019-05-16 DIAGNOSIS — F10920 Alcohol use, unspecified with intoxication, uncomplicated: Secondary | ICD-10-CM | POA: Insufficient documentation

## 2019-05-16 DIAGNOSIS — F329 Major depressive disorder, single episode, unspecified: Secondary | ICD-10-CM | POA: Insufficient documentation

## 2019-05-16 DIAGNOSIS — Z20822 Contact with and (suspected) exposure to covid-19: Secondary | ICD-10-CM | POA: Insufficient documentation

## 2019-05-16 DIAGNOSIS — R0789 Other chest pain: Secondary | ICD-10-CM | POA: Insufficient documentation

## 2019-05-16 LAB — RESPIRATORY PANEL BY RT PCR (FLU A&B, COVID)
Influenza A by PCR: NEGATIVE
Influenza B by PCR: NEGATIVE
SARS Coronavirus 2 by RT PCR: NEGATIVE

## 2019-05-16 LAB — COMPREHENSIVE METABOLIC PANEL
ALT: 41 U/L (ref 0–44)
AST: 44 U/L — ABNORMAL HIGH (ref 15–41)
Albumin: 3.8 g/dL (ref 3.5–5.0)
Alkaline Phosphatase: 124 U/L (ref 38–126)
Anion gap: 14 (ref 5–15)
BUN: <5 mg/dL — ABNORMAL LOW (ref 6–20)
CO2: 22 mmol/L (ref 22–32)
Calcium: 9.1 mg/dL (ref 8.9–10.3)
Chloride: 93 mmol/L — ABNORMAL LOW (ref 98–111)
Creatinine, Ser: 0.75 mg/dL (ref 0.61–1.24)
GFR calc Af Amer: >60 mL/min (ref >60)
GFR calc non Af Amer: >60 mL/min (ref >60)
Glucose, Bld: 164 mg/dL — ABNORMAL HIGH (ref 70–99)
Potassium: 3.7 mmol/L (ref 3.5–5.1)
Sodium: 129 mmol/L — ABNORMAL LOW (ref 135–145)
Total Bilirubin: 0.6 mg/dL (ref 0.3–1.2)
Total Protein: 6.7 g/dL (ref 6.5–8.1)

## 2019-05-16 LAB — ACETAMINOPHEN LEVEL: Acetaminophen (Tylenol), Serum: <10 ug/mL — ABNORMAL LOW (ref 10–30)

## 2019-05-16 LAB — CBC
HCT: 54 % — ABNORMAL HIGH (ref 39.0–52.0)
Hemoglobin: 18.2 g/dL — ABNORMAL HIGH (ref 13.0–17.0)
MCH: 28.2 pg (ref 26.0–34.0)
MCHC: 33.7 g/dL (ref 30.0–36.0)
MCV: 83.7 fL (ref 80.0–100.0)
Platelets: 307 10*3/uL (ref 150–400)
RBC: 6.45 MIL/uL — ABNORMAL HIGH (ref 4.22–5.81)
RDW: 12.9 % (ref 11.5–15.5)
WBC: 8.8 10*3/uL (ref 4.0–10.5)
nRBC: 0 % (ref 0.0–0.2)

## 2019-05-16 LAB — ETHANOL: Alcohol, Ethyl (B): 135 mg/dL — ABNORMAL HIGH (ref <10)

## 2019-05-16 LAB — SALICYLATE LEVEL: Salicylate Lvl: <7 mg/dL — ABNORMAL LOW (ref 7.0–30.0)

## 2019-05-16 LAB — TROPONIN I (HIGH SENSITIVITY)
Troponin I (High Sensitivity): 6 ng/L (ref <18)
Troponin I (High Sensitivity): 6 ng/L (ref <18)

## 2019-05-16 MED ORDER — LORAZEPAM 2 MG/ML IJ SOLN: 0.0000 mg | Freq: Two times a day (BID) | INTRAMUSCULAR | Status: DC

## 2019-05-16 MED ORDER — KETOROLAC TROMETHAMINE 15 MG/ML IJ SOLN
15.0000 mg | Freq: Once | INTRAMUSCULAR | Status: AC
  Administered 2019-05-16: 15 mg via INTRAVENOUS
  Filled 2019-05-16: qty 1

## 2019-05-16 MED ORDER — METOPROLOL TARTRATE 25 MG PO TABS
50.0000 mg | ORAL_TABLET | Freq: Three times a day (TID) | ORAL | Status: DC
  Administered 2019-05-17 (×2): 50 mg via ORAL
  Filled 2019-05-16 (×2): qty 2

## 2019-05-16 MED ORDER — SODIUM CHLORIDE 0.9% FLUSH: 3.0000 mL | Freq: Once | INTRAVENOUS | Status: DC

## 2019-05-16 MED ORDER — OXYCODONE HCL 5 MG PO TABS
5.0000 mg | ORAL_TABLET | Freq: Four times a day (QID) | ORAL | Status: DC | PRN
  Administered 2019-05-17 (×2): 5 mg via ORAL
  Filled 2019-05-16 (×3): qty 1

## 2019-05-16 MED ORDER — LACTATED RINGERS IV BOLUS
1000.0000 mL | Freq: Once | INTRAVENOUS | Status: AC
  Administered 2019-05-16: 1000 mL via INTRAVENOUS

## 2019-05-16 MED ORDER — BACLOFEN 10 MG PO TABS
10.0000 mg | ORAL_TABLET | Freq: Three times a day (TID) | ORAL | Status: DC
  Administered 2019-05-17 (×2): 10 mg via ORAL
  Filled 2019-05-16 (×2): qty 1

## 2019-05-16 MED ORDER — MORPHINE SULFATE (PF) 4 MG/ML IV SOLN
4.0000 mg | Freq: Once | INTRAVENOUS | Status: AC
  Administered 2019-05-16: 4 mg via INTRAVENOUS
  Filled 2019-05-16: qty 1

## 2019-05-16 MED ORDER — THIAMINE HCL 100 MG PO TABS
100.0000 mg | ORAL_TABLET | Freq: Every day | ORAL | Status: DC
  Administered 2019-05-16 – 2019-05-17 (×2): 100 mg via ORAL
  Filled 2019-05-16 (×2): qty 1

## 2019-05-16 MED ORDER — LORAZEPAM 1 MG PO TABS
1.0000 mg | ORAL_TABLET | Freq: Once | ORAL | Status: AC
  Administered 2019-05-16: 17:00:00 1 mg via SUBLINGUAL
  Filled 2019-05-16: qty 1

## 2019-05-16 MED ORDER — IBUPROFEN 400 MG PO TABS
600.0000 mg | ORAL_TABLET | Freq: Three times a day (TID) | ORAL | Status: DC | PRN
  Administered 2019-05-16: 600 mg via ORAL
  Filled 2019-05-16: qty 1

## 2019-05-16 MED ORDER — LORAZEPAM 1 MG PO TABS
0.0000 mg | ORAL_TABLET | Freq: Four times a day (QID) | ORAL | Status: DC
  Administered 2019-05-16 – 2019-05-17 (×3): 1 mg via ORAL
  Filled 2019-05-16 (×3): qty 1

## 2019-05-16 MED ORDER — IOHEXOL 300 MG/ML  SOLN
100.0000 mL | Freq: Once | INTRAMUSCULAR | Status: AC | PRN
  Administered 2019-05-16: 18:00:00 100 mL via INTRAVENOUS

## 2019-05-16 MED ORDER — TRAZODONE HCL 100 MG PO TABS
100.0000 mg | ORAL_TABLET | Freq: Every evening | ORAL | Status: DC | PRN
  Administered 2019-05-17: 50 mg via ORAL
  Filled 2019-05-16: qty 1

## 2019-05-16 MED ORDER — GABAPENTIN 100 MG PO CAPS
200.0000 mg | ORAL_CAPSULE | Freq: Three times a day (TID) | ORAL | Status: DC
  Administered 2019-05-17 (×2): 200 mg via ORAL
  Filled 2019-05-16 (×2): qty 2

## 2019-05-16 MED ORDER — LORAZEPAM 2 MG/ML IJ SOLN: 0.0000 mg | Freq: Four times a day (QID) | INTRAMUSCULAR | Status: DC

## 2019-05-16 MED ORDER — THIAMINE HCL 100 MG/ML IJ SOLN: 100.0000 mg | Freq: Every day | INTRAMUSCULAR | Status: DC

## 2019-05-16 MED ORDER — LORAZEPAM 1 MG PO TABS: 0.0000 mg | ORAL_TABLET | Freq: Two times a day (BID) | ORAL | Status: DC

## 2019-05-16 NOTE — ED Notes (Signed)
Patient aware that we need urine sample for testing, unable at this time. Pt given instruction on providing urine sample when able to do so.   

## 2019-05-16 NOTE — ED Notes (Signed)
Pt transported to CT ?

## 2019-05-16 NOTE — BH Assessment (Signed)
Tele Assessment Note   Patient Name: Mark [name] MRN: [id number] Referring Physician: Martinique Hunter, MD Location of Patient: [facility] Location of Provider: [place] Department  LOGIC HOOCK is a 54 y.o. male who presented to [facility] on voluntary basis with complaint of intermittent suicidal ideation, alcohol use/withdrawal, despondency, chest pain, and ongoing neuropathy.  Pt lives in [place] with his mother, and he is unemployed due to chronic pain.  Pt has sought disability without success.  Pt is not followed by an outpatient provider.  Pt was last assessed by TTS in January 2021.  At that time, he presented to the ED with chest pain, chronic pain, intermittent suicidal ideation, and alcohol use.  He was discharged.  Pt presented to the ED with similar complaint -- chest pain, chronic body pain due to back injury/neck injury, alcohol use, intermittent suicidal ideation, and despondency.  Pt reported that he feels suicidal ''on and off'' most days.  His last suicide attempt was in 2019 when he cut his throat.  Pt denied current plan or intent.  He declined to say whether he would be safe if discharged from the hospital.  Pt reported that he consumes a case of beer every day ''to forget,'' and BAC on admission was 138.  Pt stated that he drank today to ease shaking.  In addition to alcohol use and suicidal ideation, Pt endorsed despondency, poor sleep, poor appetite, loss of interest in daily activities, fatigue, and a history of punching walls when upset.  Pt's stressors include ongoing body pain and unemployment, as well as mother's health.  When asked what he wanted to do today, Pt stated that he was not sure.  He said he did not know how to respond when asked if he would harm himself if discharged, but he also stated that he felt safer in the hospital.  During assessment, Pt presented as alert and oriented.  He had good eye contact and was cooperative.  Pt was dressed in scrubs,  and he appeared appropriately groomed.  Pt's mood was depressed and helpless, and affect was mood-congruent.  Pt's speech was normal in rate, rhythm, and volume.  Pt's thought processes were within normal range, and thought content was logical and goal-oriented.  There was no evidence of delusion, and Pt denied hallucination.  Pt's memory and concentration were intact.  Insight and judgment were fair.  Impulse control was deemed poor as evidenced by ongoing alcohol use.  Consulted with Myrle Sheng, NP, who determined that Pt shall remain in ED, sober, and re-evaluate in AM.  Diagnosis: Alcohol Use Disorder, Severe; Bipolar I Disorder, Depressed, Severe  Past Medical History:  Past Medical History:  Diagnosis Date  . Alcohol withdrawal ([place])   . Allergy   . Anxiety Dx 2014  . COPD (chronic obstructive pulmonary disease) ([place])   . Drug abuse ([place])    pt reports opioid dependence due to previous back surgeries  . Enlarged prostate   . ETOH abuse   . GERD (gastroesophageal reflux disease) Dx 2003  . Headache(784.0)   . Hepatitis   . Hyperlipidemia Dx 2000  . Hypertension Dx 2011  . IBS (irritable bowel syndrome)    alternates constiaption/ diarrhea  . Irregular heart beat   . 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 Mental disorder   . Neuromuscular disorder ([place])    hands hurt   . Neuropathy 06/07/2013  . Seizure due to alcohol withdrawal ([place]) 01/23/2014   Per patient report  . Shortness  of breath   . Withdrawal seizures ([place]) 2014   last seizure 2014 etoh with drawal    Past Surgical History:  Procedure Laterality Date  . [place] STUDY N/A 01/16/2015   Procedure: [place] STUDY;  Surgeon: Manus Gunning, MD;  Location: [facility];  Service: Gastroenterology;  Laterality: N/A;  . [place]  . COLONOSCOPY  1998  . LEFT HEART CATHETERIZATION WITH CORONARY ANGIOGRAM N/A 01/13/2014   Procedure: LEFT HEART CATHETERIZATION WITH CORONARY ANGIOGRAM;  Surgeon: Clent Demark, MD;  Location: [facility] CATH LAB;  Service: Cardiovascular;  Laterality: N/A;  . NASAL SINUS SURGERY    . NECK SURGERY  2000   cervial fusion   . NISSEN FUNDOPLICATION    . SHOULDER SURGERY Right   . SUBACROMIAL DECOMPRESSION Right 11/16/2014   Procedure: RIGHT SHOULDER ARTHROSCOPY WITH SUBACROMIAL DECOMPRESSION ;  Surgeon: Leandrew Koyanagi, MD;  Location: [facility];  Service: Orthopedics;  Laterality: Right;  . UPPER GASTROINTESTINAL ENDOSCOPY      Family History:  Family History  Problem Relation Age of Onset  . Lung cancer Father        was a smoker  . Alcohol abuse Father   . Hypertension Mother   . Colon polyps Mother   . Breast cancer Paternal Grandmother   . Alcohol abuse Paternal Grandfather   . Colon cancer Neg Hx   . Rectal cancer Neg Hx   . Stomach cancer Neg Hx     Social History:  reports that he has been smoking cigarettes. He has a 15.00 pack-year smoking history. He has never used smokeless tobacco. He reports current alcohol use of about 56.0 standard drinks of alcohol per week. He reports that he does not use drugs.  Additional Social History:  Alcohol / Drug Use Pain Medications: See MAR Prescriptions: See MAR Over the Counter: See MAR History of alcohol / drug use?: Yes Substance #1 Name of Substance 1: Alcohol 1 - Amount (size/oz): a case of beer 1 - Frequency: Daily 1 - Duration: Ongoing 1 - Last Use / Amount: 05/16/19  CIWA: CIWA-Ar BP: (!) 153/94 Pulse Rate: 69 COWS:    Allergies:  Allergies  Allergen Reactions  . Cozaar [Losartan] Anaphylaxis  . Lisinopril Anaphylaxis    Home Medications: (Not in a hospital admission)   OB/GYN Status:  No LMP for male patient.  General Assessment Data Assessment unable to be completed: Yes Reason for not completing assessment: Cart not working, attending staff in trauma(Agreed to call when available) Location of Assessment: [facility] ED TTS Assessment: In system Is this a Tele or  Face-to-Face Assessment?: Tele Assessment Is this an Initial Assessment or a Re-assessment for this encounter?: Initial Assessment Patient Accompanied by:: N/A Language Other than English: No Living Arrangements: Other (Comment)(single family home) What gender do you identify as?: Male Marital status: Divorced Living Arrangements: Parent Can pt return to current living arrangement?: Yes Admission Status: Voluntary Is patient capable of signing voluntary admission?: Yes Referral Source: Self/Family/Friend Insurance type: None     Crisis Care Plan Living Arrangements: Parent Name of Psychiatrist: none Name of Therapist: none  Education Status Is patient currently in school?: No Is the patient employed, unemployed or receiving disability?: Unemployed  Risk to self with the past 6 months Suicidal Ideation: Yes-Currently Present Has patient been a risk to self within the past 6 months prior to admission? : No Suicidal Intent: No Has patient had any suicidal intent within  the past 6 months prior to admission? : No Is patient at risk for suicide?: Yes Suicidal Plan?: No Has patient had any suicidal plan within the past 6 months prior to admission? : No Access to Means: No What has been your use of drugs/alcohol within the last 12 months?: Alcohol Previous Attempts/Gestures: Yes How many times?: 1 Other Self Harm Risks: Alcohol Abuse Triggers for Past Attempts: Other (Comment), Other personal contacts(Ongoing chronic pain) Intentional Self Injurious Behavior: None Family Suicide History: Unknown Recent stressful life event(s): Recent negative physical changes(Ongoing neuropathy) Persecutory voices/beliefs?: No Depression: Yes Depression Symptoms: Despondent, Insomnia, Isolating, Fatigue, Loss of interest in usual pleasures, Feeling worthless/self pity Substance abuse history and/or treatment for substance abuse?: Yes Suicide prevention information given to non-admitted patients:  Not applicable  Risk to Others within the past 6 months Homicidal Ideation: No Does patient have any lifetime risk of violence toward others beyond the six months prior to admission? : No Thoughts of Harm to Others: No Current Homicidal Intent: No Current Homicidal Plan: No Access to Homicidal Means: No History of harm to others?: No Assessment of Violence: None Noted Does patient have access to weapons?: No Criminal Charges Pending?: No Does patient have a court date: No Is patient on probation?: No  Psychosis Hallucinations: None noted Delusions: None noted  Mental Status Report Appearance/Hygiene: In hospital gown Eye Contact: Good Motor Activity: Freedom of movement, Unremarkable Speech: Logical/coherent Level of Consciousness: Alert Mood: Depressed, Helpless Affect: Appropriate to circumstance Anxiety Level: Minimal Thought Processes: Relevant, Coherent Judgement: Partial Orientation: Person, Place, Time, Situation Obsessive Compulsive Thoughts/Behaviors: None  Cognitive Functioning Concentration: Normal Memory: Recent Intact, Remote Intact Is patient IDD: No Insight: Fair Impulse Control: Poor Appetite: Poor Have you had any weight changes? : No Change Sleep: Decreased Total Hours of Sleep: (Mixed) Vegetative Symptoms: None  ADLScreening [facility] Assessment Services) Patient's cognitive ability adequate to safely complete daily activities?: Yes Patient able to express need for assistance with ADLs?: Yes Independently performs ADLs?: Yes (appropriate for developmental age)  Prior Inpatient Therapy Prior Inpatient Therapy: Yes Prior Therapy Dates: 2019 Prior Therapy Facilty/Provider(s): [facility] Reason for Treatment: Suicide attempt, Bipolar  Prior Outpatient Therapy Prior Outpatient Therapy: Yes Prior Therapy Dates: 2018 Prior Therapy Facilty/Provider(s): [facility] Reason for Treatment: Bipolar Does patient have an  ACCT team?: No Does patient have Intensive In-House Services?  : No Does patient have [facility] services? : No Does patient have P4CC services?: No  ADL Screening (condition at time of admission) Patient's cognitive ability adequate to safely complete daily activities?: Yes Is the patient deaf or have difficulty hearing?: No Does the patient have difficulty seeing, even when wearing glasses/contacts?: No Does the patient have difficulty concentrating, remembering, or making decisions?: No Patient able to express need for assistance with ADLs?: Yes Does the patient have difficulty dressing or bathing?: No Independently performs ADLs?: Yes (appropriate for developmental age) Does the patient have difficulty walking or climbing stairs?: No Weakness of Legs: None Weakness of Arms/Hands: None  Home Assistive Devices/Equipment Home Assistive Devices/Equipment: None  Therapy Consults (therapy consults require a physician order) PT Evaluation Needed: No OT Evalulation Needed: No SLP Evaluation Needed: No Abuse/Neglect Assessment (Assessment to be complete while patient is alone) Abuse/Neglect Assessment Can Be Completed: Yes Physical Abuse: Denies Verbal Abuse: Denies Sexual Abuse: Denies Exploitation of patient/patient's resources: Denies Self-Neglect: Denies Values / Beliefs Cultural Requests During Hospitalization: None Spiritual Requests During Hospitalization: None Consults Spiritual Care Consult Needed: No Transition of Care Team  Consult Needed: No Advance Directives (For Healthcare) Does Patient Have a Medical Advance Directive?: No          Disposition:  Disposition Initial Assessment Completed for this Encounter: Yes  This service was provided via telemedicine using a 2-way, interactive audio and video technology.  Names of all persons participating in this telemedicine service and their role in this encounter. Name: Mark [name], Mark [name] Role: Patient              Marlowe Aschoff 05/16/2019 5:39 PM

## 2019-05-16 NOTE — ED Provider Notes (Signed)
[facility] EMERGENCY DEPARTMENT Provider Note  CSN: [id number] Arrival date & time: 05/16/19  1356     History Chief Complaint  Patient presents with  . Alcohol Intoxication  . Chest Pain  . Suicidal    Mark [name] is a 54 y.o. male.  The history is provided by the patient and medical records.  The patient is a 54 year old male with a past medical history of alcohol use, long QT syndrome, dilated cardiomyopathy, hypertension, hyperlipidemia, chronic pain who presents to the ED for multiple complaints.  The patient's biggest complaint is that he is continuing to have his chronic pain and ran out of his oxycodone that he is prescribed by his pain doctor so he began drinking heavily today reportedly to numb the pain.  Patient reports his pain is in all of his extremities as well as diffuse throughout his abdomen, chronic and unchanged today.  When asked specifically if he had any chest pain he says that he intermittently has this but it is vague and not exertional or pleuritic.  Patient also reports feeling suicidal secondary to his chronic pain and alcohol abuse and when asked if he has a plan reports "I was hoping the alcohol would do me in".     Past Medical History:  Diagnosis Date  . Alcohol withdrawal ([place])   . Allergy   . Anxiety Dx 2014  . COPD (chronic obstructive pulmonary disease) ([place])   . Drug abuse ([place])    pt reports opioid dependence due to previous back surgeries  . Enlarged prostate   . ETOH abuse   . GERD (gastroesophageal reflux disease) Dx 2003  . Headache(784.0)   . Hepatitis   . Hyperlipidemia Dx 2000  . Hypertension Dx 2011  . IBS (irritable bowel syndrome)    alternates constiaption/ diarrhea  . Irregular heart beat   . 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 Mental disorder   . Neuromuscular disorder ([place])    hands hurt   . Neuropathy 06/07/2013  . Seizure due to alcohol withdrawal ([place]) 01/23/2014   Per patient report  . Shortness of  breath   . Withdrawal seizures ([place]) 2014   last seizure 2014 etoh with drawal    Patient Active Problem List   Diagnosis Date Noted  . Withdrawal syndrome ([place]) 04/12/2019  . Trigeminy 02/12/2019  . Muscle spasm of back 11/09/2018  . Elevated liver enzymes 11/09/2018  . Hyponatremia 08/15/2018  . Elevated LFTs 08/15/2018  . Neurogenic pain 08/10/2018  . Osteoarthritis of hip (Right) 04/07/2018  . Spondylosis without myelopathy or radiculopathy, lumbar region 03/23/2018  . Other specified dorsopathies, sacral and sacrococcygeal region 03/23/2018  . Chronic hip pain (Right) 03/23/2018  . Chronic sacroiliac joint pain (Left) 03/23/2018  . Alcohol withdrawal ([place]) 12/31/2017  . Dyspepsia   . Opioid-induced hyperalgesia (with oxycodone) 02/11/2017  . Allodynia (Lower extremities) 12/26/2016  . Hyperalgesia (Lower extremities) 12/26/2016  . Sympathetic pain (lower extremity) 09/26/2016  . Lower extremity neuropathy (Primary Area of Pain) (Bilateral) (R>L) 09/26/2016  . Lumbar facet syndrome (Bilateral) (R>L) 08/22/2016  . Chronic sacroiliac joint pain (Bilateral) (R>L) 08/22/2016  . Chronic lower extremity pain (Bilateral) (R>L) 06/26/2016  . Opiate use 05/14/2016  . Occipital headache (Bilateral) (R>L) 04/16/2016  . Chronic musculoskeletal pain 04/15/2016  . 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 Major depressive disorder, recurrent episode, moderate ([place]) 04/10/2016  . Long term current use of opiate analgesic 03/18/2016  . Long term prescription opiate use 03/18/2016  .  Chronic neck pain (Bilateral)(R>L) 03/18/2016  . Cervical fusion syndrome 03/18/2016  . 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 Chronic knee pain  (Bilateral) (R>L) 03/18/2016  . GAD (generalized anxiety disorder) 12/06/2015  . Alcohol use disorder, severe, in early remission ([place]) 12/06/2015  . GERD (gastroesophageal reflux disease) 07/24/2015  . Chronic anxiety 12/19/2014  . Erectile  dysfunction 09/16/2014  . Poor dentition 07/28/2014  . Chronic maxillary sinusitis 07/28/2014  . Vitamin D insufficiency 07/05/2014  . Chronic fatigue 07/04/2014  . Chronic radicular low back pain (Third area of Pain) (Right) (to calf) 06/02/2014  . Chronic shoulder pain (Right) 05/12/2014  . Major depressive disorder, recurrent episode, severe ([place])   . Alcohol use disorder, severe, dependence ([place])   . 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 Hypokalemia   . Opioid dependence ([place]) 01/22/2014    Class: Acute  . Paroxysmal VT ([place]) 12/19/2013  . Chronic low back pain (Secondary area of Pain) (Bilateral) (R>L) 12/28/2012  . Chronic pain syndrome 12/28/2012  . Diarrhea 12/28/2012  . Alcohol intoxication ([place]) 06/09/2012  . 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 Nonsustained ventricular tachycardia ([place]) 01/20/2012  . Tobacco abuse 01/19/2012  . Essential hypertension   . Hyperlipidemia     Past Surgical History:  Procedure Laterality Date  . [place] STUDY N/A 01/16/2015   Procedure: [place] STUDY;  Surgeon: Manus Gunning, MD;  Location: [facility];  Service: Gastroenterology;  Laterality: N/A;  . [place]  . COLONOSCOPY  1998  . LEFT HEART CATHETERIZATION WITH CORONARY ANGIOGRAM N/A 01/13/2014   Procedure: LEFT HEART CATHETERIZATION WITH CORONARY ANGIOGRAM;  Surgeon: Clent Demark, MD;  Location: [facility];  Service: Cardiovascular;  Laterality: N/A;  . NASAL SINUS SURGERY    . NECK SURGERY  2000   cervial fusion   . NISSEN FUNDOPLICATION    . SHOULDER SURGERY Right   . SUBACROMIAL DECOMPRESSION Right 11/16/2014   Procedure: RIGHT SHOULDER ARTHROSCOPY WITH SUBACROMIAL DECOMPRESSION ;  Surgeon: Leandrew Koyanagi, MD;  Location: [facility];  Service: Orthopedics;  Laterality: Right;  . UPPER GASTROINTESTINAL ENDOSCOPY         Family History  Problem Relation Age of Onset  . Lung cancer Father        was a smoker    . Alcohol abuse Father   . Hypertension Mother   . Colon polyps Mother   . Breast cancer Paternal Grandmother   . Alcohol abuse Paternal Grandfather   . Colon cancer Neg Hx   . Rectal cancer Neg Hx   . Stomach cancer Neg Hx     Social History   Tobacco Use  . Smoking status: Current Every Day Smoker    Packs/day: 0.50    Years: 30.00    Pack years: 15.00    Types: Cigarettes  . Smokeless tobacco: Never Used  Substance Use Topics  . Alcohol use: Yes    Alcohol/week: 56.0 standard drinks    Types: 56 Cans of beer per week    Comment: Case a beer a day   . Drug use: No    Comment: Opana    Home Medications Prior to Admission medications   Medication Sig Start Date End Date Taking? Authorizing Provider  albuterol (VENTOLIN HFA) 108 (90 Base) MCG/ACT inhaler Inhale 2 puffs into the lungs every 6 (six) hours as needed for wheezing or shortness of breath. 08/12/17  Yes Ladell Pier, MD  baclofen (LIORESAL) 10 MG tablet Take 1 tablet (10 mg total) by mouth 3 (three) times daily. Patient taking differently: Take 10 mg by mouth See admin instructions. Take 10 mg by mouth at bedtime an an additional 10 mg twice daily as needed for spasms 02/08/19 08/07/19 Yes Milinda Pointer, MD  Cholecalciferol (VITAMIN D3 PO) Take 1 capsule by mouth daily.    Yes [provider]  famotidine (PEPCID AC) 10 MG chewable tablet Chew 10 mg by mouth 2 (two) times daily.    Yes [provider]  gabapentin (NEURONTIN) 100 MG capsule Take 2 capsules (200 mg total) by mouth 3 (three) times daily. 02/08/19 08/07/19 Yes Milinda Pointer, MD  LORazepam (ATIVAN) 1 MG tablet Take 1 mg by mouth 3 (three) times daily as needed for anxiety.  05/11/19  Yes [provider]  Magnesium Oxide 500 MG CAPS Take 1 capsule (500 mg total) by mouth 2 (two) times daily at 8 am and 10 pm. Patient taking differently: Take 1 capsule by mouth daily.  02/08/19 02/08/20 Yes Milinda Pointer, MD   metoprolol tartrate (LOPRESSOR) 50 MG tablet Take 50 mg by mouth 3 (three) times daily. 03/15/19  Yes [provider]  naproxen sodium (ALEVE) 220 MG tablet Take 220-440 mg by mouth 2 (two) times daily as needed (for pain).   Yes [provider]  oxyCODONE (OXY IR/ROXICODONE) 5 MG immediate release tablet Take 1 tablet (5 mg total) by mouth 5 (five) times daily for 10 doses. Patient taking differently: Take 5 mg by mouth See admin instructions. Take 5 mg by mouth every five hours 02/16/19 05/16/19 Yes Nita Sells, MD  traZODone (DESYREL) 100 MG tablet Take 1 tablet (100 mg total) by mouth at bedtime as needed. Patient taking differently: Take 50-100 mg by mouth at bedtime as needed for sleep.  08/21/17  Yes Ladell Pier, MD    Allergies    Cozaar [losartan], Librium [chlordiazepoxide], Lisinopril, and Tylenol [acetaminophen]  Review of Systems   Review of Systems  Constitutional: Negative for chills and fever.  Respiratory: Negative for cough and shortness of breath.   Cardiovascular: Positive for chest pain.  Gastrointestinal: Positive for abdominal pain and nausea. Negative for vomiting.  All other systems reviewed and are negative.   Physical Exam Updated Vital Signs BP 126/65   Pulse 79   Temp 97.9 F (36.6 C) (Oral)   Resp 20   SpO2 96%   Physical Exam Vitals and nursing note reviewed.  Constitutional:      Appearance: He is well-developed. He is not toxic-appearing.  HENT:     Head: Normocephalic and atraumatic.     Mouth/Throat:     Mouth: Mucous membranes are moist.     Pharynx: Oropharynx is clear.  Eyes:     Conjunctiva/sclera: Conjunctivae normal.     Pupils: Pupils are equal, round, and reactive to light.  Cardiovascular:     Rate and Rhythm: Normal rate and regular rhythm.     Pulses: Normal pulses.     Heart sounds: No murmur.  Pulmonary:     Effort: Pulmonary effort is normal. No respiratory distress.     Breath sounds: Normal  breath sounds.  Abdominal:     Palpations: Abdomen is soft.     Tenderness: There is abdominal tenderness (diffuse). There is no guarding.  Musculoskeletal:     Cervical back: Neck supple.     Right lower leg: No edema.     Left lower leg: No edema.  Skin:    General: Skin is warm and dry.  Neurological:     Mental Status: He is alert.  Psychiatric:        Mood and Affect: Mood is anxious. Affect is tearful.        Thought Content: Thought content includes suicidal ideation. Thought content does not include homicidal ideation.     ED Results / Procedures / Treatments   Labs (all labs ordered are listed, but only abnormal results are displayed) Labs Reviewed  COMPREHENSIVE METABOLIC PANEL - Abnormal; Notable for the following components:      Result Value   Sodium 129 (*)    Chloride 93 (*)    Glucose, Bld 164 (*)    BUN <5 (*)    AST 44 (*)    All other components within normal limits  ETHANOL - Abnormal; Notable for the following components:   Alcohol, Ethyl (B) 135 (*)    All other components within normal limits  SALICYLATE LEVEL - Abnormal; Notable for the following components:   Salicylate Lvl [id number] (*)    All other components within normal limits  ACETAMINOPHEN LEVEL - Abnormal; Notable for the following components:   Acetaminophen (Tylenol), Serum <10 (*)    All other components within normal limits  CBC - Abnormal; Notable for the following components:   RBC 6.45 (*)    Hemoglobin 18.2 (*)    HCT 54.0 (*)    All other components within normal limits  RAPID URINE DRUG SCREEN, HOSP PERFORMED - Abnormal; Notable for the following components:   Opiates POSITIVE (*)    Benzodiazepines POSITIVE (*)    All other components within normal limits  RESPIRATORY PANEL BY RT PCR (FLU A&B, COVID)  TROPONIN I (HIGH SENSITIVITY)  TROPONIN I (HIGH SENSITIVITY)    EKG EKG Interpretation  Date/Time:  Sunday May 16 2019 14:01:39 EST Ventricular Rate:  91 PR  Interval:  152 QRS Duration: 98 QT Interval:  398 QTC Calculation: 489 R Axis:   31 Text Interpretation: Sinus rhythm with Premature atrial complexes with Abberant conduction Prolonged QT Abnormal ECG When compared with ECG of 04/12/2019, QT has shortened Confirmed by Delora Fuel ([id number]) on 05/16/2019 11:31:40 PM   Radiology DG Chest 2 View  Result Date: 05/16/2019 CLINICAL DATA:  Chest pain and shortness of breath. EXAM: CHEST - 2 VIEW COMPARISON:  04/12/2019 FINDINGS: The heart size and mediastinal contours are within normal limits. Both lungs are clear. Cervical spine fusion hardware again seen. IMPRESSION: No active cardiopulmonary disease. Electronically Signed   By: Marlaine Hind M.D.   On: 05/16/2019 14:48   CT ABDOMEN PELVIS W CONTRAST  Result Date: 05/16/2019 CLINICAL DATA:  Diffuse abdominal pain, nausea and vomiting. Ongoing for weeks. EXAM: CT ABDOMEN AND PELVIS WITH CONTRAST TECHNIQUE: Multidetector CT imaging of the abdomen and pelvis was performed using the standard protocol following bolus administration of intravenous contrast. CONTRAST:  19mL OMNIPAQUE IOHEXOL 300 MG/ML  SOLN COMPARISON:  CT abdomen pelvis 09/08/2017 FINDINGS: Lower chest: Mild atelectasis in the lung bases. Hepatobiliary: No focal liver abnormality is seen. No gallstones, gallbladder wall thickening, or biliary dilatation. Pancreas: Unremarkable. No pancreatic ductal dilatation or surrounding inflammatory changes. Spleen: Normal in size without focal abnormality. Adrenals/Urinary Tract: Adrenal glands are unremarkable. Kidneys are normal, without renal calculi, focal lesion, or hydronephrosis. Bladder is unremarkable. Stomach/Bowel: Stomach is within normal limits. Appendix appears normal. No evidence of bowel wall thickening, distention, or inflammatory changes. Vascular/Lymphatic: Mild aortoiliac atherosclerotic disease without aneurysm. No  lymphadenopathy. Reproductive: Prostate is unremarkable. Other: No abdominal  wall hernia or abnormality. No abdominopelvic ascites. Musculoskeletal: Chronic bilateral femoral head AVN. Lower lumbar spine interbody fusion. No acute osseous abnormality. IMPRESSION: 1. No acute findings in the abdomen or pelvis to explain the patient's symptoms. 2. Chronic bilateral femoral head AVN. Aortic Atherosclerosis (ICD10-I70.0). Electronically Signed   By: Audie Pinto M.D.   On: 05/16/2019 18:17    Procedures Procedures (including critical care time)  Medications Ordered in ED Medications  sodium chloride flush (NS) 0.9 % injection 3 mL (3 mLs Intravenous Not Given 05/16/19 1937)  LORazepam (ATIVAN) injection 0-4 mg ( Intravenous See Alternative 05/17/19 0004)    Or  LORazepam (ATIVAN) tablet 0-4 mg (1 mg Oral Given 05/17/19 0004)  LORazepam (ATIVAN) injection 0-4 mg (has no administration in time range)    Or  LORazepam (ATIVAN) tablet 0-4 mg (has no administration in time range)  thiamine tablet 100 mg (100 mg Oral Given 05/16/19 1825)    Or  thiamine (B-1) injection 100 mg ( Intravenous See Alternative 05/16/19 1825)  ibuprofen (ADVIL) tablet 600 mg (600 mg Oral Given 05/16/19 2012)  baclofen (LIORESAL) tablet 10 mg (10 mg Oral Given 05/17/19 0004)  gabapentin (NEURONTIN) capsule 200 mg (200 mg Oral Given 05/17/19 0004)  metoprolol tartrate (LOPRESSOR) tablet 50 mg (50 mg Oral Given 05/17/19 0004)  oxyCODONE (Oxy IR/ROXICODONE) immediate release tablet 5 mg (has no administration in time range)  traZODone (DESYREL) tablet 100 mg (50 mg Oral Given 05/17/19 0119)  lactated ringers bolus 1,000 mL (0 mLs Intravenous Stopped 05/16/19 1937)  LORazepam (ATIVAN) tablet 1 mg (1 mg Sublingual Given 05/16/19 1701)  ketorolac (TORADOL) 15 MG/ML injection 15 mg (15 mg Intravenous Given 05/16/19 1701)  morphine 4 MG/ML injection 4 mg (4 mg Intravenous Given 05/16/19 1701)  iohexol (OMNIPAQUE) 300 MG/ML solution 100 mL (100 mLs Intravenous Contrast Given 05/16/19 1753)    ED Course  I have reviewed the  triage vital signs and the nursing notes.  Pertinent labs & imaging results that were available during my care of the patient were reviewed by me and considered in my medical decision making (see chart for details).    MDM Rules/Calculators/A&P                      Here for chronic pain as well as alcohol abuse and suicidal thoughts.  EKG without emergent changes, troponins negative x2, do not suspect ACS, chest pain is not pleuritic, he is not hypoxic, do not suspect PE.  CT abdomen pelvis done due to significant tenderness on my exam was negative for acute findings.  He does have some mild electrolyte abnormalities which I believe are secondary to his alcohol abuse, given IV fluids, he is hemodynamically stable and medically cleared at this time.  Placed on CIWA and placed in psychiatric hold protocol.  TTS was consulted for evaluation for substance abuse as well as suicidal thoughts.  They recommend observation overnight and reevaluation in the morning when he is clinically sober.    Final Clinical Impression(s) / ED Diagnoses Final diagnoses:  Suicidal ideation  Alcoholic intoxication without complication ([place])  Atypical chest pain    Rx / DC Orders ED Discharge Orders    None       Caelan Branden, [place], MD 05/17/19 Margaretha Glassing    Elnora Morrison, MD 05/18/19 [phone]

## 2019-05-16 NOTE — ED Notes (Signed)
Pt provided burgundy scrubs, instructed to remove all belongings, security called to wand patient.

## 2019-05-16 NOTE — ED Notes (Signed)
Security in triage to wand patient.

## 2019-05-16 NOTE — ED Triage Notes (Signed)
Patient reports chest pain, sob and feeling suicidal. States he has been drinking a 12 pack of beer a day, last drink was 3 hours ago. Pt also endorses being prescribed oxycodone by his doctor, has been out x4-5 days which is what lead him to drink so heavily. Pt a/ox4, calm and cooperative. Denies specific plan to harm himself, but, states, "I was kind of hoping the alcohol would do it."

## 2019-05-17 ENCOUNTER — Ambulatory Visit: Payer: Self-pay | Attending: Pain Medicine | Admitting: Pain Medicine

## 2019-05-17 DIAGNOSIS — F11281 Opioid dependence with opioid-induced sexual dysfunction: Secondary | ICD-10-CM

## 2019-05-17 DIAGNOSIS — G894 Chronic pain syndrome: Secondary | ICD-10-CM

## 2019-05-17 DIAGNOSIS — I426 Alcoholic cardiomyopathy: Secondary | ICD-10-CM

## 2019-05-17 DIAGNOSIS — F1011 Alcohol abuse, in remission: Secondary | ICD-10-CM

## 2019-05-17 DIAGNOSIS — M5441 Lumbago with sciatica, right side: Secondary | ICD-10-CM

## 2019-05-17 DIAGNOSIS — F1020 Alcohol dependence, uncomplicated: Secondary | ICD-10-CM

## 2019-05-17 DIAGNOSIS — G8929 Other chronic pain: Secondary | ICD-10-CM

## 2019-05-17 DIAGNOSIS — M5442 Lumbago with sciatica, left side: Secondary | ICD-10-CM

## 2019-05-17 DIAGNOSIS — Z9114 Patient's other noncompliance with medication regimen: Secondary | ICD-10-CM

## 2019-05-17 DIAGNOSIS — G5793 Unspecified mononeuropathy of bilateral lower limbs: Secondary | ICD-10-CM

## 2019-05-17 DIAGNOSIS — M5416 Radiculopathy, lumbar region: Secondary | ICD-10-CM

## 2019-05-17 LAB — RAPID URINE DRUG SCREEN, HOSP PERFORMED
Amphetamines: NOT DETECTED
Barbiturates: NOT DETECTED
Benzodiazepines: POSITIVE — AB
Cocaine: NOT DETECTED
Opiates: POSITIVE — AB
Tetrahydrocannabinol: NOT DETECTED

## 2019-05-17 NOTE — Progress Notes (Addendum)
Patient ID: Mark [name], male   DOB: Mar 06, 1966, 54 y.o.   MRN: [id number]   Psychiatry reassessment   [id number] [name] is a 54 y.o. male who presented to [facility] on voluntary basis with complaint of intermittent suicidal ideation, alcohol use/withdrawal, despondency, chest pain, and ongoing neuropathy.  Pt lives in [place] with his mother, and he is unemployed due to chronic pain.  Pt has sought disability without success.  Pt is not followed by an outpatient provider.  Pt was last assessed by TTS in January 2021.  At that time, he presented to the ED with chest pain, chronic pain, intermittent suicidal ideation, and alcohol use.  He was discharged.  Pt presented to the ED with similar complaint -- chest pain, chronic body pain due to back injury/neck injury, alcohol use, intermittent suicidal ideation, and despondency.  Pt reported that he feels suicidal ''on and off'' most days.  His last suicide attempt was in 2019 when he cut his throat.  Pt denied current plan or intent.  He declined to say whether he would be safe if discharged from the hospital.  Pt reported that he consumes a case of beer every day ''to forget,'' and BAC on admission was 138.  Pt stated that he drank today to ease shaking.  In addition to alcohol use and suicidal ideation, Pt endorsed despondency, poor sleep, poor appetite, loss of interest in daily activities, fatigue, and a history of punching walls when upset.  Pt's stressors include ongoing body pain and unemployment, as well as mother's health.  When asked what he wanted to do today, Pt stated that he was not sure.  He said he did not know how to respond when asked if he would harm himself if discharged, but he also stated that he felt safer in the hospital.   Psychiatric reassessment This is a 54 year old male who presented to the ED for concerns as noted above. During this evaluation, he is alert and oriented x4, calm and cooperative. He denies SI, HI or psychosis.  He states that he has a long history of chronic  pain due to medical condition and his pain is being managed through a pain clinic. He states due to his pain, he has been drinking alcohol excessively.Marland Kitchen His ethanol on admission was 138. He denies other substance abuse or use.   Disposition: Patient denies SI, HI or AVH. There is no evidence of imminent risk to self or others at present.  Patient does not meet criteria for psychiatric inpatient admission. It is reccommended to continue follow-up with his outpatient team..Disucssed resources for substance abuse and outpatient therapy which will be faxed over. Patient psychiatrically cleared.   ED updated on disposition.

## 2019-05-17 NOTE — ED Notes (Signed)
Pt talking to the TTS machine now

## 2019-05-17 NOTE — ED Notes (Signed)
Pt ambulated to the bathroom without any difficulty. 

## 2019-05-17 NOTE — ED Notes (Signed)
Warm blanket provided to pt.

## 2019-05-17 NOTE — Assessment & Plan Note (Signed)
The patient was personally informed on 05/17/2019 that we will not be prescribing any further opioid analgesics due to his medication agreement violations.

## 2019-05-17 NOTE — ED Notes (Signed)
IV has been taken out 

## 2019-05-17 NOTE — Discharge Instructions (Addendum)
You will need to follow up with your outpatient doctor (eg. Your pain specialist) regarding your chronic pain management.  We do not refill chronic pain medication scripts in the ER.  *  Chronic Pain Discharge Instructions   Please be aware of our hospital's policy regarding opioids, narcotics, and controlled substances.  Emergency care providers appreciate that many patients coming to [place] are in severe pain, and we wish to address their pain in the safest, most responsible manner.  It is important to recognize however, that the proper treatment of chronic pain differs from that of the pain of injuries and acute illnesses.  Our goal is to provide quality, safe, personalized care and we thank you for giving [place] the opportunity to serve you.  If you have a chronic pain syndrome (i.e. chronic headaches, recurrent back or neck pain, dental pain, abdominal or pelvis pain without a specific diagnosis, or neuropathic pain such as fibromyalgia) or recurrent visits for the same condition without an acute diagnosis, you may be treated with non-narcotics and other non-addictive medicines.  Patients managing chronic pain should have provisions in place with their primary care doctor for breakthrough pain. It is every patient's personal responsibility to maintain active prescriptions with his or her primary care physician or specialist. If you are in crisis, you should call your primary physician first.  If your physician directs you to the emergency department, please have the doctor call and speak to our attending physician concerning your care.  The use of narcotics and related agents for chronic pain syndromes may lead to many physical and psychological problems.  Nearly as many people die from prescription narcotics each year as die from car crashes.  Additionally, this risk is known to increase if such prescriptions are obtained from a variety of sources.  Therefore, your name may be checked first through the  [place].  This database is a record of controlled substance medication prescriptions that the patient has received.  This has been established by [facility] in an effort to eliminate the dangerous, and often life threatening, practice of obtaining multiple prescriptions from different medical providers. Only your primary care physician or a pain management specialist is able to safely treat such syndromes with narcotic medications long-term.    In those rare situations where the Emergency Department physician feels narcotic medications are appropriate, the physician will prescribe these in very limited quantities.  The amount of these medications will last only until you can see your primary care physician in his/her office.  Any patient who returns to the ED seeking refills should expect only non-narcotic pain medications.  Prescriptions for narcotic or sedating medications that have been lost, stolen or expired will not be refilled in the Emergency Department.    Finally, in the event of an acute medical condition exists and the emergency physician feels it is necessary that the patient be given a narcotic or sedating medication -  a responsible adult driver should available to provide the patient with safe transportation home.

## 2019-05-17 NOTE — ED Notes (Signed)
Pt asked for a coke, given a decaf coke with ice.

## 2019-05-17 NOTE — ED Notes (Signed)
Breakfast ordered 

## 2019-05-17 NOTE — ED Notes (Signed)
Pt.s breakfast has arrived. 

## 2019-05-17 NOTE — ED Notes (Signed)
Pt given oxy due to fact that he takes it Q4/5 scheduled at home.

## 2019-05-17 NOTE — ED Provider Notes (Addendum)
54 yo male here with SI, voluntarily here, and etoh withdrawal, also ran out of chronic pain medications.  Patient resting comfortably in room this morning, eating breakfast.  No sign of acute withdrawal syndrome.  Vitals reviewed.  Pending [facility] re-evaluation this morning.  Update 1035 am- patient cleared for discharge.  He requested a few ativan tablets to go home on.  We explained we would only offer librium, and he says librium upsets his stomach.  Unfortunately this is our option here.  He will be discharged.   Wyvonnia Dusky, MD 05/17/19 [id number]    Wyvonnia Dusky, MD 05/17/19 [phone]

## 2019-05-19 ENCOUNTER — Emergency Department (HOSPITAL_COMMUNITY): Payer: Self-pay

## 2019-05-19 ENCOUNTER — Encounter (HOSPITAL_COMMUNITY): Payer: Self-pay | Admitting: Emergency Medicine

## 2019-05-19 ENCOUNTER — Other Ambulatory Visit: Payer: Self-pay

## 2019-05-19 ENCOUNTER — Inpatient Hospital Stay (HOSPITAL_COMMUNITY)
Admission: EM | Admit: 2019-05-19 | Discharge: 2019-05-21 | DRG: 897 | Disposition: A | Discharge status: Home / Self Care | Payer: Self-pay | Attending: Internal Medicine | Admitting: Internal Medicine

## 2019-05-19 DIAGNOSIS — Z981 Arthrodesis status: Secondary | ICD-10-CM

## 2019-05-19 DIAGNOSIS — F1123 Opioid dependence with withdrawal: Principal | ICD-10-CM | POA: Diagnosis present

## 2019-05-19 DIAGNOSIS — Z801 Family history of malignant neoplasm of trachea, bronchus and lung: Secondary | ICD-10-CM

## 2019-05-19 DIAGNOSIS — F10239 Alcohol dependence with withdrawal, unspecified: Secondary | ICD-10-CM | POA: Diagnosis present

## 2019-05-19 DIAGNOSIS — R739 Hyperglycemia, unspecified: Secondary | ICD-10-CM | POA: Diagnosis present

## 2019-05-19 DIAGNOSIS — Z888 Allergy status to other drugs, medicaments and biological substances status: Secondary | ICD-10-CM

## 2019-05-19 DIAGNOSIS — E871 Hypo-osmolality and hyponatremia: Secondary | ICD-10-CM | POA: Diagnosis present

## 2019-05-19 DIAGNOSIS — I4729 Other ventricular tachycardia: Secondary | ICD-10-CM

## 2019-05-19 DIAGNOSIS — F10129 Alcohol abuse with intoxication, unspecified: Secondary | ICD-10-CM

## 2019-05-19 DIAGNOSIS — G894 Chronic pain syndrome: Secondary | ICD-10-CM | POA: Diagnosis present

## 2019-05-19 DIAGNOSIS — F10229 Alcohol dependence with intoxication, unspecified: Secondary | ICD-10-CM | POA: Diagnosis present

## 2019-05-19 DIAGNOSIS — E785 Hyperlipidemia, unspecified: Secondary | ICD-10-CM

## 2019-05-19 DIAGNOSIS — F329 Major depressive disorder, single episode, unspecified: Secondary | ICD-10-CM

## 2019-05-19 DIAGNOSIS — R008 Other abnormalities of heart beat: Secondary | ICD-10-CM | POA: Diagnosis present

## 2019-05-19 DIAGNOSIS — Z803 Family history of malignant neoplasm of breast: Secondary | ICD-10-CM

## 2019-05-19 DIAGNOSIS — G629 Polyneuropathy, unspecified: Secondary | ICD-10-CM | POA: Diagnosis present

## 2019-05-19 DIAGNOSIS — I498 Other specified cardiac arrhythmias: Secondary | ICD-10-CM | POA: Insufficient documentation

## 2019-05-19 DIAGNOSIS — K589 Irritable bowel syndrome without diarrhea: Secondary | ICD-10-CM

## 2019-05-19 DIAGNOSIS — F331 Major depressive disorder, recurrent, moderate: Secondary | ICD-10-CM | POA: Diagnosis present

## 2019-05-19 DIAGNOSIS — R7303 Prediabetes: Secondary | ICD-10-CM | POA: Diagnosis present

## 2019-05-19 DIAGNOSIS — Z8371 Family history of colonic polyps: Secondary | ICD-10-CM

## 2019-05-19 DIAGNOSIS — Z811 Family history of alcohol abuse and dependence: Secondary | ICD-10-CM

## 2019-05-19 DIAGNOSIS — I4581 Long QT syndrome: Secondary | ICD-10-CM | POA: Diagnosis present

## 2019-05-19 DIAGNOSIS — Y903 Blood alcohol level of 60-79 mg/100 ml: Secondary | ICD-10-CM | POA: Diagnosis present

## 2019-05-19 DIAGNOSIS — Z791 Long term (current) use of non-steroidal anti-inflammatories (NSAID): Secondary | ICD-10-CM

## 2019-05-19 DIAGNOSIS — Z886 Allergy status to analgesic agent status: Secondary | ICD-10-CM

## 2019-05-19 DIAGNOSIS — J449 Chronic obstructive pulmonary disease, unspecified: Secondary | ICD-10-CM | POA: Diagnosis present

## 2019-05-19 DIAGNOSIS — T402X5A Adverse effect of other opioids, initial encounter: Secondary | ICD-10-CM

## 2019-05-19 DIAGNOSIS — I493 Ventricular premature depolarization: Secondary | ICD-10-CM | POA: Diagnosis present

## 2019-05-19 DIAGNOSIS — M549 Dorsalgia, unspecified: Secondary | ICD-10-CM

## 2019-05-19 DIAGNOSIS — F10929 Alcohol use, unspecified with intoxication, unspecified: Secondary | ICD-10-CM | POA: Diagnosis present

## 2019-05-19 DIAGNOSIS — F17210 Nicotine dependence, cigarettes, uncomplicated: Secondary | ICD-10-CM | POA: Diagnosis present

## 2019-05-19 DIAGNOSIS — Z20822 Contact with and (suspected) exposure to covid-19: Secondary | ICD-10-CM | POA: Diagnosis present

## 2019-05-19 DIAGNOSIS — F1120 Opioid dependence, uncomplicated: Secondary | ICD-10-CM

## 2019-05-19 DIAGNOSIS — K219 Gastro-esophageal reflux disease without esophagitis: Secondary | ICD-10-CM | POA: Diagnosis present

## 2019-05-19 DIAGNOSIS — I10 Essential (primary) hypertension: Secondary | ICD-10-CM | POA: Diagnosis present

## 2019-05-19 DIAGNOSIS — Z79899 Other long term (current) drug therapy: Secondary | ICD-10-CM

## 2019-05-19 DIAGNOSIS — Z8249 Family history of ischemic heart disease and other diseases of the circulatory system: Secondary | ICD-10-CM

## 2019-05-19 DIAGNOSIS — I472 Ventricular tachycardia: Secondary | ICD-10-CM | POA: Diagnosis present

## 2019-05-19 DIAGNOSIS — F10939 Alcohol use, unspecified with withdrawal, unspecified: Secondary | ICD-10-CM | POA: Diagnosis present

## 2019-05-19 LAB — CBC
HCT: 53.6 % — ABNORMAL HIGH (ref 39.0–52.0)
Hemoglobin: 17.8 g/dL — ABNORMAL HIGH (ref 13.0–17.0)
MCH: 28.2 pg (ref 26.0–34.0)
MCHC: 33.2 g/dL (ref 30.0–36.0)
MCV: 84.9 fL (ref 80.0–100.0)
Platelets: 303 10*3/uL (ref 150–400)
RBC: 6.31 MIL/uL — ABNORMAL HIGH (ref 4.22–5.81)
RDW: 13.1 % (ref 11.5–15.5)
WBC: 9.7 10*3/uL (ref 4.0–10.5)
nRBC: 0 % (ref 0.0–0.2)

## 2019-05-19 LAB — TROPONIN I (HIGH SENSITIVITY)
Troponin I (High Sensitivity): 6 ng/L (ref <18)
Troponin I (High Sensitivity): 6 ng/L (ref <18)

## 2019-05-19 LAB — COMPREHENSIVE METABOLIC PANEL
ALT: 32 U/L (ref 0–44)
AST: 33 U/L (ref 15–41)
Albumin: 3.6 g/dL (ref 3.5–5.0)
Alkaline Phosphatase: 110 U/L (ref 38–126)
Anion gap: 15 (ref 5–15)
BUN: <5 mg/dL — ABNORMAL LOW (ref 6–20)
CO2: 23 mmol/L (ref 22–32)
Calcium: 9.3 mg/dL (ref 8.9–10.3)
Chloride: 94 mmol/L — ABNORMAL LOW (ref 98–111)
Creatinine, Ser: 0.8 mg/dL (ref 0.61–1.24)
GFR calc Af Amer: >60 mL/min (ref >60)
GFR calc non Af Amer: >60 mL/min (ref >60)
Glucose, Bld: 263 mg/dL — ABNORMAL HIGH (ref 70–99)
Potassium: 3.9 mmol/L (ref 3.5–5.1)
Sodium: 132 mmol/L — ABNORMAL LOW (ref 135–145)
Total Bilirubin: 0.4 mg/dL (ref 0.3–1.2)
Total Protein: 6.4 g/dL — ABNORMAL LOW (ref 6.5–8.1)

## 2019-05-19 LAB — RAPID URINE DRUG SCREEN, HOSP PERFORMED
Amphetamines: NOT DETECTED
Barbiturates: NOT DETECTED
Benzodiazepines: POSITIVE — AB
Cocaine: NOT DETECTED
Opiates: NOT DETECTED
Tetrahydrocannabinol: NOT DETECTED

## 2019-05-19 LAB — RESPIRATORY PANEL BY RT PCR (FLU A&B, COVID)
Influenza A by PCR: NEGATIVE
Influenza B by PCR: NEGATIVE
SARS Coronavirus 2 by RT PCR: NEGATIVE

## 2019-05-19 LAB — HEMOGLOBIN A1C
Hgb A1c MFr Bld: 6.4 % — ABNORMAL HIGH (ref 4.8–5.6)
Mean Plasma Glucose: 136.98 mg/dL

## 2019-05-19 LAB — LIPASE, BLOOD: Lipase: 34 U/L (ref 11–51)

## 2019-05-19 LAB — PHOSPHORUS: Phosphorus: 3.8 mg/dL (ref 2.5–4.6)

## 2019-05-19 LAB — MAGNESIUM: Magnesium: 2 mg/dL (ref 1.7–2.4)

## 2019-05-19 LAB — ETHANOL: Alcohol, Ethyl (B): 79 mg/dL — ABNORMAL HIGH (ref <10)

## 2019-05-19 MED ORDER — ADULT MULTIVITAMIN W/MINERALS CH
1.0000 | ORAL_TABLET | Freq: Every day | ORAL | Status: DC
  Administered 2019-05-19 – 2019-05-21 (×3): 1 via ORAL
  Filled 2019-05-19 (×3): qty 1

## 2019-05-19 MED ORDER — THIAMINE HCL 100 MG/ML IJ SOLN: 100.0000 mg | Freq: Once | INTRAMUSCULAR | Status: DC

## 2019-05-19 MED ORDER — LACTATED RINGERS IV BOLUS
500.0000 mL | Freq: Once | INTRAVENOUS | Status: AC
  Administered 2019-05-19: 500 mL via INTRAVENOUS

## 2019-05-19 MED ORDER — ENOXAPARIN SODIUM 40 MG/0.4ML ~~LOC~~ SOLN
40.0000 mg | SUBCUTANEOUS | Status: DC
  Administered 2019-05-19 – 2019-05-20 (×2): 40 mg via SUBCUTANEOUS
  Filled 2019-05-19 (×2): qty 0.4

## 2019-05-19 MED ORDER — LACTATED RINGERS IV BOLUS
1000.0000 mL | Freq: Once | INTRAVENOUS | Status: AC
  Administered 2019-05-19: 1000 mL via INTRAVENOUS

## 2019-05-19 MED ORDER — METOPROLOL TARTRATE 25 MG PO TABS: 50.0000 mg | ORAL_TABLET | Freq: Three times a day (TID) | ORAL | Status: DC

## 2019-05-19 MED ORDER — THIAMINE HCL 100 MG PO TABS
100.0000 mg | ORAL_TABLET | Freq: Every day | ORAL | Status: DC
  Administered 2019-05-20 – 2019-05-21 (×2): 100 mg via ORAL
  Filled 2019-05-19 (×2): qty 1

## 2019-05-19 MED ORDER — POTASSIUM CHLORIDE CRYS ER 20 MEQ PO TBCR
30.0000 meq | EXTENDED_RELEASE_TABLET | Freq: Four times a day (QID) | ORAL | Status: AC
  Administered 2019-05-19 (×2): 30 meq via ORAL
  Filled 2019-05-19 (×2): qty 1

## 2019-05-19 MED ORDER — FAMOTIDINE 10 MG PO TABS
10.0000 mg | ORAL_TABLET | Freq: Two times a day (BID) | ORAL | Status: DC
  Administered 2019-05-19 – 2019-05-21 (×5): 10 mg via ORAL
  Filled 2019-05-19 (×8): qty 1

## 2019-05-19 MED ORDER — LORAZEPAM 2 MG/ML IJ SOLN
2.0000 mg | Freq: Once | INTRAMUSCULAR | Status: AC
  Administered 2019-05-19: 2 mg via INTRAVENOUS
  Filled 2019-05-19: qty 1

## 2019-05-19 MED ORDER — MAGNESIUM SULFATE 2 GM/50ML IV SOLN
INTRAVENOUS | Status: AC
  Administered 2019-05-19: 2 g via INTRAVENOUS
  Filled 2019-05-19: qty 50

## 2019-05-19 MED ORDER — METOPROLOL TARTRATE 25 MG PO TABS
25.0000 mg | ORAL_TABLET | Freq: Three times a day (TID) | ORAL | Status: DC
  Administered 2019-05-19 (×2): 25 mg via ORAL
  Filled 2019-05-19 (×3): qty 1

## 2019-05-19 MED ORDER — THIAMINE HCL 100 MG/ML IJ SOLN
100.0000 mg | Freq: Once | INTRAMUSCULAR | Status: AC
  Administered 2019-05-19: 100 mg via INTRAVENOUS
  Filled 2019-05-19: qty 2

## 2019-05-19 MED ORDER — CHLORDIAZEPOXIDE HCL 25 MG PO CAPS: 25.0000 mg | ORAL_CAPSULE | ORAL | Status: DC

## 2019-05-19 MED ORDER — CHLORDIAZEPOXIDE HCL 25 MG PO CAPS: 25.0000 mg | ORAL_CAPSULE | Freq: Every day | ORAL | Status: DC

## 2019-05-19 MED ORDER — GABAPENTIN 100 MG PO CAPS
200.0000 mg | ORAL_CAPSULE | Freq: Three times a day (TID) | ORAL | Status: DC
  Administered 2019-05-20 – 2019-05-21 (×4): 200 mg via ORAL
  Filled 2019-05-19 (×4): qty 2

## 2019-05-19 MED ORDER — CHLORDIAZEPOXIDE HCL 25 MG PO CAPS: 25.0000 mg | ORAL_CAPSULE | Freq: Three times a day (TID) | ORAL | Status: DC

## 2019-05-19 MED ORDER — LORAZEPAM 1 MG PO TABS: 1.0000 mg | ORAL_TABLET | ORAL | Status: DC | PRN

## 2019-05-19 MED ORDER — LORAZEPAM 2 MG/ML IJ SOLN
1.0000 mg | INTRAMUSCULAR | Status: DC | PRN
  Administered 2019-05-19: 1 mg via INTRAVENOUS
  Administered 2019-05-19: 2 mg via INTRAVENOUS
  Filled 2019-05-19 (×2): qty 1

## 2019-05-19 MED ORDER — MAGNESIUM SULFATE IN D5W 1-5 GM/100ML-% IV SOLN
1.0000 g | Freq: Once | INTRAVENOUS | Status: DC
  Filled 2019-05-19: qty 100

## 2019-05-19 MED ORDER — ONDANSETRON HCL 4 MG/2ML IJ SOLN
4.0000 mg | Freq: Once | INTRAMUSCULAR | Status: DC
  Filled 2019-05-19: qty 2

## 2019-05-19 MED ORDER — METOPROLOL TARTRATE 25 MG PO TABS: 12.5000 mg | ORAL_TABLET | Freq: Three times a day (TID) | ORAL | Status: DC

## 2019-05-19 MED ORDER — MAGNESIUM SULFATE 2 GM/50ML IV SOLN: 2.0000 g | Freq: Once | INTRAVENOUS | Status: AC

## 2019-05-19 MED ORDER — OXYCODONE HCL 5 MG PO TABS
5.0000 mg | ORAL_TABLET | Freq: Four times a day (QID) | ORAL | Status: DC | PRN
  Administered 2019-05-19 – 2019-05-20 (×3): 5 mg via ORAL
  Filled 2019-05-19 (×4): qty 1

## 2019-05-19 MED ORDER — PROCHLORPERAZINE EDISYLATE 10 MG/2ML IJ SOLN
5.0000 mg | Freq: Four times a day (QID) | INTRAMUSCULAR | Status: DC | PRN
  Administered 2019-05-20: 5 mg via INTRAVENOUS
  Filled 2019-05-19 (×2): qty 1

## 2019-05-19 MED ORDER — INSULIN ASPART 100 UNIT/ML ~~LOC~~ SOLN
0.0000 [IU] | Freq: Three times a day (TID) | SUBCUTANEOUS | Status: DC
  Administered 2019-05-20 – 2019-05-21 (×3): 1 [IU] via SUBCUTANEOUS

## 2019-05-19 MED ORDER — RAMELTEON 8 MG PO TABS
8.0000 mg | ORAL_TABLET | Freq: Every day | ORAL | Status: DC
  Administered 2019-05-19 – 2019-05-20 (×2): 8 mg via ORAL
  Filled 2019-05-19 (×3): qty 1

## 2019-05-19 MED ORDER — CHLORDIAZEPOXIDE HCL 25 MG PO CAPS
25.0000 mg | ORAL_CAPSULE | Freq: Four times a day (QID) | ORAL | Status: DC
  Administered 2019-05-19 – 2019-05-20 (×4): 25 mg via ORAL
  Filled 2019-05-19 (×4): qty 1

## 2019-05-19 NOTE — ED Triage Notes (Addendum)
Pt was seen here on 3/7 for alcohol withdrawal symptoms, states he was discharged and is still having withdrawal symptoms, drank 1/5 of liquor last night along with 2-40 oz beers, reports he had 4 beers this morning between 6-10am to "try and stop the shakes." He also reports he is withdrawing from oxycodone, states last dose was a few days ago when he was here. Pt a/ox4, endorses chest pain and anxiousness, tremulous in triage.

## 2019-05-19 NOTE — H&P (Signed)
Date: 05/19/2019               Patient Name:  Mark [name] MRN: [id number]  DOB: 1966-01-04 Age / Sex: 54 y.o., male   PCP: Patient, No Pcp Per         Medical Service: Internal Medicine Teaching Service         Attending Physician: Dr. Velna Ochs, MD    First Contact: [name], DO, Marland Kitchen Pager: [facility] [phone])  Second Contact: Eileen Stanford, MD, Obed Pager: [phone])       After Hours (After 5p/  First Contact Pager: [phone]  weekends / holidays): Second Contact Pager: [phone]   Chief Complaint: chest pain  History of Present Illness: Mark [name] is a 54 yo male with a pertinent PMH of ETOH-use disorder (hx. Seizures), IBS, HTN, HLD, COPD, Long QT syndrome, and chronic pain who presented with chest pain with associated nausea and non-bloody vomiting. He was recently admitted for ETOH withdrawal. Last drink was liquor and beer this morning. He denies any tremors, visual hallucinations, diaphoresis, fever, chills, shortness of breath, abdominal pain, diarrhea or constipation.   ED Course:  Patient presented with hyperglycemia, tachycardia with a ventricular arrhythmia on EKG  and troponin's negative x2 and a recent history of ETOH use.  Patient was given 2 mg of IV Ativan, a liter bolus of LR, IV thiamine and magnesium repleted.   Lab Orders     Respiratory Panel by RT PCR (Flu A&B, Covid) - Nasopharyngeal Swab     Comprehensive metabolic panel     Ethanol     cbc     Rapid urine drug screen (hospital performed)     Lipase, blood     Magnesium     Phosphorus     Hemoglobin A1c     Comprehensive metabolic panel     CBC   Meds:  Current Meds  Medication Sig  . albuterol (VENTOLIN HFA) 108 (90 Base) MCG/ACT inhaler Inhale 2 puffs into the lungs every 6 (six) hours as needed for wheezing or shortness of breath.  . baclofen (LIORESAL) 10 MG tablet Take 1 tablet (10 mg total) by mouth 3 (three) times daily. (Patient taking differently: Take 10 mg by mouth See admin  instructions. Take 10 mg by mouth at bedtime an an additional 10 mg twice daily as needed for spasms)  . Cholecalciferol (VITAMIN D3 PO) Take 1 capsule by mouth daily.   . famotidine (PEPCID AC) 10 MG chewable tablet Chew 10 mg by mouth 2 (two) times daily.   Marland Kitchen gabapentin (NEURONTIN) 100 MG capsule Take 2 capsules (200 mg total) by mouth 3 (three) times daily.  Marland Kitchen LORazepam (ATIVAN) 1 MG tablet Take 1 mg by mouth 3 (three) times daily as needed for anxiety.   . Magnesium Oxide 500 MG CAPS Take 1 capsule (500 mg total) by mouth 2 (two) times daily at 8 am and 10 pm. (Patient taking differently: Take 1 capsule by mouth daily. )  . metoprolol tartrate (LOPRESSOR) 50 MG tablet Take 50 mg by mouth 3 (three) times daily.  . naproxen sodium (ALEVE) 220 MG tablet Take 220-440 mg by mouth 2 (two) times daily as needed (for pain).  Marland Kitchen oxyCODONE (OXY IR/ROXICODONE) 5 MG immediate release tablet Take 1 tablet (5 mg total) by mouth 5 (five) times daily for 10 doses. (Patient taking differently: Take 5 mg by mouth See admin instructions. Take 5 mg by mouth every five hours)  . traZODone (DESYREL) 100  MG tablet Take 1 tablet (100 mg total) by mouth at bedtime as needed. (Patient taking differently: Take 50-100 mg by mouth at bedtime as needed for sleep. )    Social:  Patient live sin [place], by himself Drinks liquor and beer Has 2 kids that he does not live with.   Family History:  Family History  Problem Relation Age of Onset  . Lung cancer Father        was a smoker  . Alcohol abuse Father   . Hypertension Mother   . Colon polyps Mother   . Breast cancer Paternal Grandmother   . Alcohol abuse Paternal Grandfather   . Colon cancer Neg Hx   . Rectal cancer Neg Hx   . Stomach cancer Neg Hx      Allergies: Allergies as of 05/19/2019 - Review Complete 05/19/2019  Allergen Reaction Noted  . Cozaar [losartan] Anaphylaxis 02/28/2014  . Librium [chlordiazepoxide] Nausea Only and Other (See  Comments) 05/16/2019  . Lisinopril Anaphylaxis 03/20/2011  . Tylenol [acetaminophen] Other (See Comments) 05/16/2019   Past Medical History:  Diagnosis Date  . Alcohol withdrawal ([place])   . Allergy   . Anxiety Dx 2014  . COPD (chronic obstructive pulmonary disease) ([place])   . Drug abuse ([place])    pt reports opioid dependence due to previous back surgeries  . Enlarged prostate   . ETOH abuse   . GERD (gastroesophageal reflux disease) Dx 2003  . Headache(784.0)   . Hepatitis   . Hyperlipidemia Dx 2000  . Hypertension Dx 2011  . IBS (irritable bowel syndrome)    alternates constiaption/ diarrhea  . Irregular heart beat   . 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 Mental disorder   . Neuromuscular disorder ([place])    hands hurt   . Neuropathy 06/07/2013  . Seizure due to alcohol withdrawal ([place]) 01/23/2014   Per patient report  . Shortness of breath   . Withdrawal seizures ([place]) 2014   last seizure 2014 etoh with drawal     Review of Systems: A complete ROS was negative except as per HPI.   Physical Exam: Blood pressure 126/78, pulse 81, resp. rate (!) 21, SpO2 96 %. Physical Exam  Constitutional: He is oriented to person, place, and time. No distress.  HENT:  Head: Normocephalic.  Eyes: EOM are normal.  Cardiovascular: Intact distal pulses. Tachycardia present.  Pulmonary/Chest: Effort normal. No respiratory distress. He has no wheezes. He has no rales. He exhibits tenderness.  Abdominal: Soft. He exhibits no distension. There is no abdominal tenderness.  Musculoskeletal:        General: No tenderness or edema. Normal range of motion.     Cervical back: Normal range of motion.  Neurological: He is alert and oriented to person, place, and time.  Skin: Skin is warm and dry. He is not diaphoretic.     Labs: CBC    Component Value Date/Time   WBC 9.7 05/19/2019 1122   RBC 6.31 (H) 05/19/2019 1122   HGB 17.8 (H) 05/19/2019 1122   HGB 16.4 04/09/2017 1022   HCT 53.6 (H)  05/19/2019 1122   HCT 45.4 04/09/2017 1022   PLT 303 05/19/2019 1122   PLT 293 04/09/2017 1022   MCV 84.9 05/19/2019 1122   MCV 81 04/09/2017 1022   MCV 80 06/09/2013 1402   MCH 28.2 05/19/2019 1122   MCHC 33.2 05/19/2019 1122   RDW 13.1 05/19/2019 1122   RDW 14.3 04/09/2017 1022   RDW 14.5 06/09/2013 1402  LYMPHSABS 1.1 04/12/2019 0939   MONOABS 0.7 04/12/2019 0939   EOSABS 0.1 04/12/2019 0939   BASOSABS 0.1 04/12/2019 0939     CMP     Component Value Date/Time   NA 132 (L) 05/19/2019 1122   NA 139 04/09/2017 1022   NA 138 06/09/2013 1402   K 3.9 05/19/2019 1122   K 4.7 06/09/2013 1402   CL 94 (L) 05/19/2019 1122   CL 105 06/09/2013 1402   CO2 23 05/19/2019 1122   CO2 27 06/09/2013 1402   GLUCOSE 263 (H) 05/19/2019 1122   GLUCOSE 161 (H) 06/09/2013 1402   BUN <5 (L) 05/19/2019 1122   BUN 12 04/09/2017 1022   BUN 9 06/09/2013 1402   CREATININE 0.80 05/19/2019 1122   CREATININE 0.84 12/15/2015 1646   CALCIUM 9.3 05/19/2019 1122   CALCIUM 8.6 06/09/2013 1402   PROT 6.4 (L) 05/19/2019 1122   PROT 6.6 04/09/2017 1022   ALBUMIN 3.6 05/19/2019 1122   ALBUMIN 4.6 04/09/2017 1022   AST 33 05/19/2019 1122   ALT 32 05/19/2019 1122   ALKPHOS 110 05/19/2019 1122   BILITOT 0.4 05/19/2019 1122   BILITOT 0.5 04/09/2017 1022   GFRNONAA >60 05/19/2019 1122   GFRNONAA >89 12/15/2015 1646   GFRAA >60 05/19/2019 1122   GFRAA >89 12/15/2015 1646    Imaging: DG Chest Portable 1 View  Result Date: 05/19/2019 CLINICAL DATA:  Chest pain EXAM: PORTABLE CHEST 1 VIEW COMPARISON:  May 16, 2019 FINDINGS: The heart size and mediastinal contours are within normal limits. Both lungs are clear. No acute osseous abnormality. Cervical fixation hardware seen. IMPRESSION: No active disease. Electronically Signed   By: Prudencio Pair M.D.   On: 05/19/2019 14:05    EKG: personally reviewed my interpretation is ventricular tachycardia with bigeminy and trigeminy   Assessment & Plan by  Problem: Principal Problem:   Alcohol withdrawal ([name]) Active Problems:   Essential hypertension   Alcohol intoxication ([place])   Chronic pain syndrome   Paroxysmal VT ([name])   Long Q-T syndrome   GERD (gastroesophageal reflux disease)   Major depressive disorder, recurrent episode, moderate ([name])   Trigeminy   Mark [name] is a 54 y.o. with pertinent PMH of  ETOH-use disorder (hx. Seizures), IBS, HTN, HLD, COPD, Long QT syndrome, and chronic pain who presented with chest pain and tachycardia and admit for acute ETOH intoxication with ventricular tachycardia on hospital day 0  #ETOH Intoxication with history of withdrawal seizures: Patient has had several admissions for acute alcohol intoxication with withdrawal this.  Patient states that he drank liquor and beer this morning and does present with signs and symptoms concerning for acute intoxication.  Considering his history of seizures we will start him on Librium 25 mg 4 times daily and titrate down. -Continue Librium 5 mg twice daily with taper -Continue CIWA protocol with Ativan -Check electrolytes and replace as needed -Replace B12, folate, thiamine.  #Ventricular tachycardia with trigeminy and bigeminy  Patient presents with chest pain with ventricular tachycardia with bigeminy/trigeminy.  Cardiology consulted in the ED. Dr. Caryl Comes recommends starting beta-blocker for for his arrhythmia. -Started metoprolol 25 mg 3 times daily per cardiology recommendations -Continue cardiac monitoring  #Chronic Pain Syndrome -Patient started back on home oxycodone 5 mg every 6 hours as needed  #Hyperglycemia: -Hemoglobin A1c ordered  #GERD: -Continued home Pepcid 10 mg twice daily Diet: Heart Healthy VTE: Enoxaparin IVF: LR,Bolus Code: Full  Prior to Admission Living Arrangement: Home, living Alone Anticipated Discharge Location:  Home Barriers to Discharge: Substance use, high risk of bounce back  Dispo: Admit patient to Observation  with expected length of stay less than 2 midnights.  Signed: [name], MD 05/19/2019, 3:42 PM  Pager: [phone]

## 2019-05-19 NOTE — ED Notes (Signed)
EKG w/ decreased ectopy, still having 3-4 beat runs of vtach multiple times a minute, but does appear to be improving since admin of mag. MD Tomi Bamberger made aware

## 2019-05-19 NOTE — ED Notes (Signed)
Pt in NSR w/ nearly no ectopy. Pt reports he does feel improved to when he came in. No further run PVCs or vtach on monitor

## 2019-05-19 NOTE — ED Provider Notes (Addendum)
[facility] EMERGENCY DEPARTMENT Provider Note   CSN: [id number] Arrival date & time: 05/19/19  1050     History Chief Complaint  Patient presents with  . Alcohol Intoxication    Mark [name] is a 54 y.o. male.  HPI   Pt presents to the ED with complaints of chest pain and alcohol withdrawal.  Pt states he has a history of alcohol abuse.  He was recently seen in the ED on 3/7 for alcohol withdrawal.  Pt went home. He continues to drink.  Pt also states he had been using oxycodone but last use was several days ago.  Patient is complaining of pain in the center of his chest.  He has had nausea vomiting but the pain started prior to the nausea and vomiting.  He denies any fevers chills or cough.  Past Medical History:  Diagnosis Date  . Alcohol withdrawal ([place])   . Allergy   . Anxiety Dx 2014  . COPD (chronic obstructive pulmonary disease) ([place])   . Drug abuse ([place])    pt reports opioid dependence due to previous back surgeries  . Enlarged prostate   . ETOH abuse   . GERD (gastroesophageal reflux disease) Dx 2003  . Headache(784.0)   . Hepatitis   . Hyperlipidemia Dx 2000  . Hypertension Dx 2011  . IBS (irritable bowel syndrome)    alternates constiaption/ diarrhea  . Irregular heart beat   . 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 Mental disorder   . Neuromuscular disorder ([place])    hands hurt   . Neuropathy 06/07/2013  . Seizure due to alcohol withdrawal ([place]) 01/23/2014   Per patient report  . Shortness of breath   . Withdrawal seizures ([place]) 2014   last seizure 2014 etoh with drawal    Patient Active Problem List   Diagnosis Date Noted  . Withdrawal syndrome ([place]) 04/12/2019  . Trigeminy 02/12/2019  . Muscle spasm of back 11/09/2018  . Elevated liver enzymes 11/09/2018  . Hyponatremia 08/15/2018  . Elevated LFTs 08/15/2018  . Neurogenic pain 08/10/2018  . Osteoarthritis of hip (Right) 04/07/2018  . Spondylosis without myelopathy or  radiculopathy, lumbar region 03/23/2018  . Other specified dorsopathies, sacral and sacrococcygeal region 03/23/2018  . Chronic hip pain (Right) 03/23/2018  . Chronic sacroiliac joint pain (Left) 03/23/2018  . Alcohol withdrawal ([place]) 12/31/2017  . Pain management contract broken 12/15/2017  . Dyspepsia   . Opioid-induced hyperalgesia (with oxycodone) 02/11/2017  . Allodynia (Lower extremities) 12/26/2016  . Hyperalgesia (Lower extremities) 12/26/2016  . Sympathetic pain (lower extremity) 09/26/2016  . Lower extremity neuropathy (Primary Area of Pain) (Bilateral) (R>L) 09/26/2016  . Lumbar facet syndrome (Bilateral) (R>L) 08/22/2016  . Chronic sacroiliac joint pain (Bilateral) (R>L) 08/22/2016  . Chronic lower extremity pain (Bilateral) (R>L) 06/26/2016  . Opiate use 05/14/2016  . Occipital headache (Bilateral) (R>L) 04/16/2016  . Chronic musculoskeletal pain 04/15/2016  . 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 Major depressive disorder, recurrent episode, moderate ([place]) 04/10/2016  . Long term current use of opiate analgesic 03/18/2016  . Long term prescription opiate use 03/18/2016  . Chronic neck pain (Bilateral)(R>L) 03/18/2016  . Cervical fusion syndrome 03/18/2016  . 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 Chronic knee pain  (Bilateral) (R>L) 03/18/2016  . GAD (generalized anxiety disorder) 12/06/2015  . Alcohol use disorder, severe, in early remission ([place]) 12/06/2015  . GERD (gastroesophageal reflux disease) 07/24/2015  . Chronic anxiety 12/19/2014  . Erectile dysfunction 09/16/2014  . Poor  dentition 07/28/2014  . Chronic maxillary sinusitis 07/28/2014  . Vitamin D insufficiency 07/05/2014  . Chronic fatigue 07/04/2014  . Chronic radicular low back pain (Third area of Pain) (Right) (to calf) 06/02/2014  . Chronic shoulder pain (Right) 05/12/2014  . Major depressive disorder, recurrent episode, severe ([place])   . Alcohol use disorder, severe, dependence  ([place])   . 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 Hypokalemia   . Opioid dependence ([place]) 01/22/2014    Class: Acute  . Paroxysmal VT ([place]) 12/19/2013  . Chronic low back pain (Secondary area of Pain) (Bilateral) (R>L) 12/28/2012  . Chronic pain syndrome 12/28/2012  . Diarrhea 12/28/2012  . History of alcohol abuse 12/28/2012  . Alcohol intoxication ([place]) 06/09/2012  . 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 Nonsustained ventricular tachycardia ([place]) 01/20/2012  . Alcohol abuse 01/19/2012  . Tobacco abuse 01/19/2012  . Essential hypertension   . Hyperlipidemia     Past Surgical History:  Procedure Laterality Date  . [place] STUDY N/A 01/16/2015   Procedure: [place] STUDY;  Surgeon: Manus Gunning, MD;  Location: [facility];  Service: Gastroenterology;  Laterality: N/A;  . [place]  . COLONOSCOPY  1998  . LEFT HEART CATHETERIZATION WITH CORONARY ANGIOGRAM N/A 01/13/2014   Procedure: LEFT HEART CATHETERIZATION WITH CORONARY ANGIOGRAM;  Surgeon: Clent Demark, MD;  Location: [facility];  Service: Cardiovascular;  Laterality: N/A;  . NASAL SINUS SURGERY    . NECK SURGERY  2000   cervial fusion   . NISSEN FUNDOPLICATION    . SHOULDER SURGERY Right   . SUBACROMIAL DECOMPRESSION Right 11/16/2014   Procedure: RIGHT SHOULDER ARTHROSCOPY WITH SUBACROMIAL DECOMPRESSION ;  Surgeon: Leandrew Koyanagi, MD;  Location: [facility];  Service: Orthopedics;  Laterality: Right;  . UPPER GASTROINTESTINAL ENDOSCOPY         Family History  Problem Relation Age of Onset  . Lung cancer Father        was a smoker  . Alcohol abuse Father   . Hypertension Mother   . Colon polyps Mother   . Breast cancer Paternal Grandmother   . Alcohol abuse Paternal Grandfather   . Colon cancer Neg Hx   . Rectal cancer Neg Hx   . Stomach cancer Neg Hx     Social History   Tobacco Use  . Smoking status: Current Every Day Smoker    Packs/day: 0.50     Years: 30.00    Pack years: 15.00    Types: Cigarettes  . Smokeless tobacco: Never Used  Substance Use Topics  . Alcohol use: Yes    Alcohol/week: 56.0 standard drinks    Types: 56 Cans of beer per week    Comment: Case a beer a day   . Drug use: No    Comment: Opana    Home Medications Prior to Admission medications   Medication Sig Start Date End Date Taking? Authorizing Provider  albuterol (VENTOLIN HFA) 108 (90 Base) MCG/ACT inhaler Inhale 2 puffs into the lungs every 6 (six) hours as needed for wheezing or shortness of breath. 08/12/17  Yes Ladell Pier, MD  baclofen (LIORESAL) 10 MG tablet Take 1 tablet (10 mg total) by mouth 3 (three) times daily. Patient taking differently: Take 10 mg by mouth See admin instructions. Take 10 mg by mouth at bedtime an an additional 10 mg twice daily as needed for spasms 02/08/19 08/07/19 Yes Milinda Pointer, MD  Cholecalciferol (  VITAMIN D3 PO) Take 1 capsule by mouth daily.    Yes [provider]  famotidine (PEPCID AC) 10 MG chewable tablet Chew 10 mg by mouth 2 (two) times daily.    Yes [provider]  gabapentin (NEURONTIN) 100 MG capsule Take 2 capsules (200 mg total) by mouth 3 (three) times daily. 02/08/19 08/07/19 Yes Milinda Pointer, MD  LORazepam (ATIVAN) 1 MG tablet Take 1 mg by mouth 3 (three) times daily as needed for anxiety.  05/11/19  Yes [provider]  Magnesium Oxide 500 MG CAPS Take 1 capsule (500 mg total) by mouth 2 (two) times daily at 8 am and 10 pm. Patient taking differently: Take 1 capsule by mouth daily.  02/08/19 02/08/20 Yes Milinda Pointer, MD  metoprolol tartrate (LOPRESSOR) 50 MG tablet Take 50 mg by mouth 3 (three) times daily. 03/15/19  Yes [provider]  naproxen sodium (ALEVE) 220 MG tablet Take 220-440 mg by mouth 2 (two) times daily as needed (for pain).   Yes [provider]  oxyCODONE (OXY IR/ROXICODONE) 5 MG immediate release tablet Take 1 tablet (5 mg  total) by mouth 5 (five) times daily for 10 doses. Patient taking differently: Take 5 mg by mouth See admin instructions. Take 5 mg by mouth every five hours 02/16/19 05/19/19 Yes Nita Sells, MD  traZODone (DESYREL) 100 MG tablet Take 1 tablet (100 mg total) by mouth at bedtime as needed. Patient taking differently: Take 50-100 mg by mouth at bedtime as needed for sleep.  08/21/17  Yes Ladell Pier, MD    Allergies    Cozaar [losartan], Librium [chlordiazepoxide], Lisinopril, and Tylenol [acetaminophen]  Review of Systems   Review of Systems  All other systems reviewed and are negative.   Physical Exam Updated Vital Signs BP 126/78   Pulse 81   Resp (!) 21   SpO2 96%   Physical Exam Vitals and nursing note reviewed.  Constitutional:      Appearance: He is well-developed. He is ill-appearing.  HENT:     Head: Normocephalic and atraumatic.     Right Ear: External ear normal.     Left Ear: External ear normal.  Eyes:     General: No scleral icterus.       Right eye: No discharge.        Left eye: No discharge.     Conjunctiva/sclera: Conjunctivae normal.  Neck:     Trachea: No tracheal deviation.  Cardiovascular:     Rate and Rhythm: Regular rhythm. Tachycardia present.     Comments: Frequent ectopy Pulmonary:     Effort: Pulmonary effort is normal. No respiratory distress.     Breath sounds: Normal breath sounds. No stridor. No wheezing or rales.  Abdominal:     General: Bowel sounds are normal. There is no distension.     Palpations: Abdomen is soft.     Tenderness: There is abdominal tenderness in the right upper quadrant and epigastric area. There is no guarding or rebound.  Musculoskeletal:        General: No tenderness.     Cervical back: Neck supple.  Skin:    General: Skin is warm and dry.     Findings: No rash.  Neurological:     Mental Status: He is alert.     Cranial Nerves: No cranial nerve deficit (no facial droop, extraocular movements  intact, no slurred speech).     Sensory: No sensory deficit.     Motor: No abnormal muscle  tone or seizure activity.     Coordination: Coordination normal.     ED Results / Procedures / Treatments   Labs (all labs ordered are listed, but only abnormal results are displayed) Labs Reviewed  COMPREHENSIVE METABOLIC PANEL - Abnormal; Notable for the following components:      Result Value   Sodium 132 (*)    Chloride 94 (*)    Glucose, Bld 263 (*)    BUN <5 (*)    Total Protein 6.4 (*)    All other components within normal limits  ETHANOL - Abnormal; Notable for the following components:   Alcohol, Ethyl (B) 79 (*)    All other components within normal limits  CBC - Abnormal; Notable for the following components:   RBC 6.31 (*)    Hemoglobin 17.8 (*)    HCT 53.6 (*)    All other components within normal limits  RESPIRATORY PANEL BY RT PCR (FLU A&B, COVID)  LIPASE, BLOOD  MAGNESIUM  RAPID URINE DRUG SCREEN, HOSP PERFORMED  PHOSPHORUS  HEMOGLOBIN A1C  TROPONIN I (HIGH SENSITIVITY)  TROPONIN I (HIGH SENSITIVITY)    EKG EKG Interpretation  Date/Time:  Wednesday May 19 2019 10:59:30 EST Ventricular Rate:  133 PR Interval:  148 QRS Duration: 92 QT Interval:  344 QTC Calculation: 511 R Axis:   42 Text Interpretation:  Critical Test Result: Arrhythmia Sinus tachycardia with frequent and consecutive Premature ventricular complexes in a pattern of bigeminy Nonspecific ST abnormality Abnormal ECG consecutive pvcs and bigeminy are new Confirmed by Dorie Rank [phone]) on 05/19/2019 12:20:06 PM   Radiology DG Chest Portable 1 View  Result Date: 05/19/2019 CLINICAL DATA:  Chest pain EXAM: PORTABLE CHEST 1 VIEW COMPARISON:  May 16, 2019 FINDINGS: The heart size and mediastinal contours are within normal limits. Both lungs are clear. No acute osseous abnormality. Cervical fixation hardware seen. IMPRESSION: No active disease. Electronically Signed   By: Prudencio Pair M.D.   On:  05/19/2019 14:05    Procedures Procedures (including critical care time)  Medications Ordered in ED Medications  thiamine tablet 100 mg (has no administration in time range)  multivitamin with minerals tablet 1 tablet (1 tablet Oral Given 05/19/19 1520)  chlordiazePOXIDE (LIBRIUM) capsule 25 mg (25 mg Oral Given 05/19/19 1520)    Followed by  chlordiazePOXIDE (LIBRIUM) capsule 25 mg (has no administration in time range)    Followed by  chlordiazePOXIDE (LIBRIUM) capsule 25 mg (has no administration in time range)    Followed by  chlordiazePOXIDE (LIBRIUM) capsule 25 mg (has no administration in time range)  LORazepam (ATIVAN) tablet 1-4 mg (has no administration in time range)    Or  LORazepam (ATIVAN) injection 1-4 mg (has no administration in time range)  famotidine (PEPCID) tablet 10 mg (has no administration in time range)  oxyCODONE (Oxy IR/ROXICODONE) immediate release tablet 5 mg (5 mg Oral Given 05/19/19 1520)  metoprolol tartrate (LOPRESSOR) tablet 25 mg (25 mg Oral Given 05/19/19 1524)  prochlorperazine (COMPAZINE) injection 5 mg (has no administration in time range)  enoxaparin (LOVENOX) injection 40 mg (has no administration in time range)  lactated ringers bolus 500 mL (has no administration in time range)  LORazepam (ATIVAN) injection 2 mg (2 mg Intravenous Given 05/19/19 1252)  lactated ringers bolus 1,000 mL (0 mLs Intravenous Stopped 05/19/19 1436)  thiamine (B-1) injection 100 mg (100 mg Intravenous Given 05/19/19 1253)  magnesium sulfate IVPB 2 g 50 mL (0 g Intravenous Stopped 05/19/19 1353)  LORazepam (ATIVAN) injection 2  mg (2 mg Intravenous Given 05/19/19 1329)    ED Course  I have reviewed the triage vital signs and the nursing notes.  Pertinent labs & imaging results that were available during my care of the patient were reviewed by me and considered in my medical decision making (see chart for details).  Clinical Course as of May 19 1530  Wed May 19, 2019  1237  Cardiac monitor continues to show couplets and triplets of PVCs.  Frequent ectopy noted.  IV magnesium has been ordered.   [JK]  1322 Still tremulous and tachycardic.  Requests additional medications for his tremor.  Note pulse of 31 is not accurate.  He is having ectopy interfering with the automated pulse monitor.  Bedside telemetry showing greater than 100    [JK]  1435 Patient's heart rate has improved.  Ectopy has decreased with magnesium and benzodiazepines   [JK]  1438 Case discussed with cardiology, Trish.  Cardiology will consult    [JK]  1531 Discussed with Dr Terrence Dupont.   States pt has a normal EF.  Recommends beta blockers.     [JK]    Clinical Course User Index [JK] Dorie Rank, MD   MDM Rules/Calculators/A&P                     Patient presented to the ED for persistent alcohol withdrawal and opiate withdrawal symptoms.  Patient was notably tachycardic on arrival.  He was tremulous.  Symptoms were consistent with alcohol withdrawal.  Patient was also having significant ectopy with frequent PVCs and consecutive beats of PVCs.  Patient was started on IV Ativan for withdrawal as well as magnesium.  He has had some improvement.  Chest pain most likely related to his nausea vomiting no signs of acute cardiac ischemia.  X-ray without signs to suggest esophageal rupture. I will consult the medical service for continued admission and monitoring.  He is at high risk for DTs and complications. Final Clinical Impression(s) / ED Diagnoses Final diagnoses:  Alcohol withdrawal syndrome with complication ([place])  Bigeminy  Frequent PVCs     Dorie Rank, MD 05/19/19 1438    Dorie Rank, MD 05/19/19 [id number]    Dorie Rank, MD 05/19/19 1556

## 2019-05-20 DIAGNOSIS — E871 Hypo-osmolality and hyponatremia: Secondary | ICD-10-CM

## 2019-05-20 DIAGNOSIS — K582 Mixed irritable bowel syndrome: Secondary | ICD-10-CM

## 2019-05-20 DIAGNOSIS — R7303 Prediabetes: Secondary | ICD-10-CM

## 2019-05-20 LAB — COMPREHENSIVE METABOLIC PANEL
ALT: 29 U/L (ref 0–44)
AST: 28 U/L (ref 15–41)
Albumin: 3.3 g/dL — ABNORMAL LOW (ref 3.5–5.0)
Alkaline Phosphatase: 105 U/L (ref 38–126)
Anion gap: 14 (ref 5–15)
BUN: 5 mg/dL — ABNORMAL LOW (ref 6–20)
CO2: 25 mmol/L (ref 22–32)
Calcium: 9 mg/dL (ref 8.9–10.3)
Chloride: 97 mmol/L — ABNORMAL LOW (ref 98–111)
Creatinine, Ser: 0.89 mg/dL (ref 0.61–1.24)
GFR calc Af Amer: >60 mL/min (ref >60)
GFR calc non Af Amer: >60 mL/min (ref >60)
Glucose, Bld: 142 mg/dL — ABNORMAL HIGH (ref 70–99)
Potassium: 4.2 mmol/L (ref 3.5–5.1)
Sodium: 136 mmol/L (ref 135–145)
Total Bilirubin: 1 mg/dL (ref 0.3–1.2)
Total Protein: 5.7 g/dL — ABNORMAL LOW (ref 6.5–8.1)

## 2019-05-20 LAB — GLUCOSE, CAPILLARY
Glucose-Capillary: 155 mg/dL — ABNORMAL HIGH (ref 70–99)
Glucose-Capillary: 144 mg/dL — ABNORMAL HIGH (ref 70–99)
Glucose-Capillary: 140 mg/dL — ABNORMAL HIGH (ref 70–99)
Glucose-Capillary: 102 mg/dL — ABNORMAL HIGH (ref 70–99)

## 2019-05-20 LAB — CBC
HCT: 47.1 % (ref 39.0–52.0)
Hemoglobin: 15.6 g/dL (ref 13.0–17.0)
MCH: 28.2 pg (ref 26.0–34.0)
MCHC: 33.1 g/dL (ref 30.0–36.0)
MCV: 85 fL (ref 80.0–100.0)
Platelets: 246 10*3/uL (ref 150–400)
RBC: 5.54 MIL/uL (ref 4.22–5.81)
RDW: 13.2 % (ref 11.5–15.5)
WBC: 8.9 10*3/uL (ref 4.0–10.5)
nRBC: 0 % (ref 0.0–0.2)

## 2019-05-20 MED ORDER — METOPROLOL TARTRATE 50 MG PO TABS
50.0000 mg | ORAL_TABLET | Freq: Three times a day (TID) | ORAL | Status: DC
  Administered 2019-05-20 – 2019-05-21 (×3): 50 mg via ORAL
  Filled 2019-05-20 (×3): qty 1

## 2019-05-20 MED ORDER — BUPRENORPHINE HCL-NALOXONE HCL 2-0.5 MG SL SUBL
2.0000 | SUBLINGUAL_TABLET | Freq: Every day | SUBLINGUAL | Status: AC
  Administered 2019-05-20: 2 via SUBLINGUAL
  Filled 2019-05-20: qty 1

## 2019-05-20 NOTE — [place] Notes (Addendum)
Patient current vitals are as follows:   05/20/19 1600  Vitals  Temp 97.7 F (36.5 C)  Temp Source Oral  BP (!) 178/115  MAP (mmHg) 132  BP Location Left Arm  BP Method Automatic  Patient Position (if appropriate) Lying  Pulse Rate 66  Pulse Rate Source Monitor  Resp 18  Oxygen Therapy  SpO2 97 %  O2 Device Room Air  MEWS Score  MEWS Temp 0  MEWS Systolic 0  MEWS Pulse 0  MEWS RR 0  MEWS LOC 0  MEWS Score 0  MEWS Score Color Green   Patient states he feels slightly short of breath and feels like he may pass out, feels anxious, no tremors, feels nauseous. Bed alarm is on, patient advised not to get out of bed, RN paged Dr. [name] as per physician sticky notes. Compazine 5 mg PRN given for nausea. Dr. [name] states to continue to monitor, RN will carry out orders.

## 2019-05-20 NOTE — Plan of Care (Signed)

## 2019-05-20 NOTE — TOC Initial Note (Signed)
Transition of Care [facility]) - Initial/Assessment Note    Patient Details  Name: Mark [name] MRN: [id number] Date of Birth: [birth year]  [facility] of [facility]) CM/SW Contact:    Alberteen Sam, LCSW Phone Number: 05/20/2019, 4:11 PM  Clinical Narrative:                  CSW met with patient at bedside and provided substance abuse resource packet. Patient reports he's interested in either outpatient or inpatient facilities however has had difficulties in the past with no insurance. CSW highlighted several options for patient to call and complete over the phone assessment to identify if he would qualify for any of the inpatient faiclities that take no insurance. Patient states he also has a dog that may be a barrier. Patient reports he will call facilities to identify what he qualifies.   No further dc needs identified at this time.   Expected Discharge Plan: Home/Self Care Barriers to Discharge: Continued Medical Work up   Patient Goals and CMS Choice   CMS Medicare.gov Compare Post Acute Care list provided to:: Patient Choice offered to / list presented to : Patient  Expected Discharge Plan and Services Expected Discharge Plan: Home/Self Care       Living arrangements for the past 2 months: Single Family Home                                      Prior Living Arrangements/Services Living arrangements for the past 2 months: Single Family Home Lives with:: Self Patient language and need for interpreter reviewed:: Yes Do you feel safe going back to the place where you live?: Yes      Need for Family Participation in Patient Care: No (Comment) Care giver support system in place?: No (comment)   Criminal Activity/Legal Involvement Pertinent to Current Situation/Hospitalization: No - Comment as needed  Activities of Daily Living      Permission Sought/Granted                  Emotional Assessment Appearance:: Appears stated age Attitude/Demeanor/Rapport:  Gracious Affect (typically observed): Calm Orientation: : Oriented to Self, Oriented to Place, Oriented to  Time, Oriented to Situation Alcohol / Substance Use: Not Applicable Psych Involvement: No (comment)  Admission diagnosis:  Alcohol withdrawal ([place]) [F10.239] Bigeminy [I49.8] Frequent PVCs [I49.3] Alcohol withdrawal syndrome with complication [facility]) [U76.546] Patient Active Problem List   Diagnosis Date Noted  . Bigeminy   . Frequent PVCs   . Withdrawal syndrome ([place]) 04/12/2019  . Trigeminy 02/12/2019  . Muscle spasm of back 11/09/2018  . Elevated liver enzymes 11/09/2018  . Hyponatremia 08/15/2018  . Elevated LFTs 08/15/2018  . Neurogenic pain 08/10/2018  . Osteoarthritis of hip (Right) 04/07/2018  . Spondylosis without myelopathy or radiculopathy, lumbar region 03/23/2018  . Other specified dorsopathies, sacral and sacrococcygeal region 03/23/2018  . Chronic hip pain (Right) 03/23/2018  . Chronic sacroiliac joint pain (Left) 03/23/2018  . Alcohol withdrawal ([place]) 12/31/2017  . Pain management contract broken 12/15/2017  . Dyspepsia   . Opioid-induced hyperalgesia (with oxycodone) 02/11/2017  . Allodynia (Lower extremities) 12/26/2016  . Hyperalgesia (Lower extremities) 12/26/2016  . Sympathetic pain (lower extremity) 09/26/2016  . Lower extremity neuropathy (Primary Area of Pain) (Bilateral) (R>L) 09/26/2016  . Lumbar facet syndrome (Bilateral) (R>L) 08/22/2016  . Chronic sacroiliac joint pain (Bilateral) (R>L) 08/22/2016  . Chronic lower extremity pain (Bilateral) (R>L)  06/26/2016  . Opiate use 05/14/2016  . Occipital headache (Bilateral) (R>L) 04/16/2016  . Chronic musculoskeletal pain 04/15/2016  . 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[place]) 04/15/2016  . Major depressive disorder, recurrent episode, moderate ([place]) 04/10/2016  . Long term current use of opiate analgesic 03/18/2016  . Long term prescription opiate use 03/18/2016  . Chronic neck pain (Bilateral)(R>L)  03/18/2016  . Cervical fusion syndrome 03/18/2016  . 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 Chronic knee pain  (Bilateral) (R>L) 03/18/2016  . GAD (generalized anxiety disorder) 12/06/2015  . Alcohol use disorder, severe, in early remission ([place]) 12/06/2015  . GERD (gastroesophageal reflux disease) 07/24/2015  . Chronic anxiety 12/19/2014  . Erectile dysfunction 09/16/2014  . Poor dentition 07/28/2014  . Chronic maxillary sinusitis 07/28/2014  . Vitamin D insufficiency 07/05/2014  . Chronic fatigue 07/04/2014  . Chronic radicular low back pain (Third area of Pain) (Right) (to calf) 06/02/2014  . Chronic shoulder pain (Right) 05/12/2014  . Major depressive disorder, recurrent episode, severe ([place])   . Alcohol use disorder, severe, dependence ([place])   . 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 Hypokalemia   . Opioid use disorder, moderate, dependence ([place]) 01/22/2014    Class: Acute  . Paroxysmal VT ([place]) 12/19/2013  . Chronic low back pain (Secondary area of Pain) (Bilateral) (R>L) 12/28/2012  . Chronic pain syndrome 12/28/2012  . Diarrhea 12/28/2012  . History of alcohol abuse 12/28/2012  . Alcohol intoxication ([place]) 06/09/2012  . 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 Nonsustained ventricular tachycardia ([place]) 01/20/2012  . Alcohol abuse 01/19/2012  . Tobacco abuse 01/19/2012  . Essential hypertension   . Hyperlipidemia    PCP:  Patient, No Pcp Per Pharmacy:   [place], [place] - [address] [place] [address] Phone: [phone] Fax: [phone]     Social Determinants of Health (SDOH) Interventions    Readmission Risk Interventions No flowsheet data found.

## 2019-05-20 NOTE — Progress Notes (Addendum)
Subjective: HD#0 Events Overnight: received 3 mg overnight  Patient was seen this morning on rounds. He states that he feels tired, nauseas, experiencing hot flashes. His tremors have improved. He states that he tried to [employer] but was unable to tolerate it. He continued to endorse his history of IBS (alternating). He states that he has been without Oxycodone for about a week or 2 now. He was seeing a pain medicine physician in [place] and that last time it was renewed was last month. He denies obtaining his pain medicine illicitly. He states that when he discontinued opioid, he began feeling nauseous. He reports that there have been times where he functioned well with only a prescription of opioids. His physician discontinued his narcotics because he overtook it. He has a good relationship with his daughter and other family members. He is currently not working and hasn't been able to keep a job for about 10 years. He has never tried suboxone in the past but has tried methadone and took it for about 6 or 7 years. He was started on 80 mg and self titrated down to about 20 mg. He states that he is willing to try suboxone.   Objective:  Vital signs in last 24 hours: Vitals:   05/19/19 1953 05/20/19 0018 05/20/19 0253 05/20/19 0457  BP: (!) 151/98 (!) 148/97  (!) 151/102  Pulse: 79 78  74  Resp: 18 18  17   Temp: 98.4 F (36.9 C) 97.6 F (36.4 C)  97.8 F (36.6 C)  TempSrc: Oral Oral  Oral  SpO2: 98% 97%  97%  Weight: 103.8 kg  101.9 kg    Supplemental O2: RA CIWA: 6>2>3 (overnight)   Physical Exam: Physical Exam  Constitutional: He is oriented to person, place, and time. No distress.  HENT:  Head: Normocephalic and atraumatic.  Eyes: EOM are normal.  Cardiovascular: Normal rate and intact distal pulses.  Pulmonary/Chest: Effort normal and breath sounds normal.  Abdominal: Soft.  Musculoskeletal:        General: No tenderness or edema. Normal range of motion.     Cervical  back: Normal range of motion.  Neurological: He is alert and oriented to person, place, and time.  Skin: Skin is warm and dry. He is not diaphoretic.    Filed Weights   05/19/19 1953 05/20/19 0253  Weight: 103.8 kg 101.9 kg     Intake/Output Summary (Last 24 hours) at 05/20/2019 0614 Last data filed at 05/20/2019 0500 Gross per 24 hour  Intake 2370 ml  Output 226 ml  Net 2144 ml    Risk Score:   Pertinent labs/Imaging: CBC Latest Ref Rng & Units 05/19/2019 05/16/2019 04/13/2019  WBC 4.0 - 10.5 K/uL 9.7 8.8 8.7  Hemoglobin 13.0 - 17.0 g/dL 17.8(H) 18.2(H) 16.4  Hematocrit 39.0 - 52.0 % 53.6(H) 54.0(H) 47.9  Platelets 150 - 400 K/uL 303 307 259    CMP Latest Ref Rng & Units 05/19/2019 05/16/2019 04/13/2019  Glucose 70 - 99 mg/dL 263(H) 164(H) 110(H)  BUN 6 - 20 mg/dL <5(L) <5(L) <5(L)  Creatinine 0.61 - 1.24 mg/dL 0.80 0.75 0.80  Sodium 135 - 145 mmol/L 132(L) 129(L) 138  Potassium 3.5 - 5.1 mmol/L 3.9 3.7 3.8  Chloride 98 - 111 mmol/L 94(L) 93(L) 101  CO2 22 - 32 mmol/L 23 22 25   Calcium 8.9 - 10.3 mg/dL 9.3 9.1 8.9  Total Protein 6.5 - 8.1 g/dL 6.4(L) 6.7 5.8(L)  Total Bilirubin 0.3 - 1.2 mg/dL 0.4 0.6 2.3(H)  Alkaline Phos 38 - 126 U/L 110 124 107  AST 15 - 41 U/L 33 44(H) 20  ALT 0 - 44 U/L 32 41 17     Assessment/Plan:  Principal Problem:   Alcohol withdrawal ([name]) Active Problems:   Essential hypertension   Alcohol intoxication ([place])   Chronic pain syndrome   Paroxysmal VT ([name])   Long Q-T syndrome   GERD (gastroesophageal reflux disease)   Major depressive disorder, recurrent episode, moderate ([name])   Trigeminy    Patient Summary: Mark [name] is a 54 y.o. with pertinent PMH of  ETOH-use disorder (hx. Seizures), IBS, HTN, HLD, COPD, Long QT syndrome, and chronic pain who presented with chest pain and tachycardia and admit for ETOH intoxication and ventricular tachycardia  on hospital day 0  #Opioid-use Disorder: - Start suboxone 4-1 mg sublingual today  at 3pm, redose at bedtime. - D/c oxycodone. - Monitor electrolytes and replace as needed - Supplement B12, folate, thiamine  #ETOH intoxication: - D/c the CIWA ativan - D/c librium  #Ventricular arrhythmia with trigeminy and bigeminy  - Cardiology recommended metoprolol 25 mg 3 times daily. -Cardiac monitoring  #Chronic Pain Syndrome -Oxycodone 5 mg every 6 hours  #Pre-diabetes: #Hyperglycemia: -He will likely need diabetes education and follow-up with an outpatient primary care physician  #Hyponatremia: - Repeat CMP today  Diet: Heart Healthy IVF: None,None VTE: Enoxaparin Code: Full PT/OT recs: Pending TOC recs: pending  Dispo: Anticipated discharge 1-2 days.    [name], D.O. MCIMTP, PGY-1 Date 05/20/2019 Time 6:14 AM

## 2019-05-20 NOTE — Plan of Care (Signed)
  Problem: Education: Goal: Knowledge of General Education information will improve Description: Including pain rating scale, medication(s)/side effects and non-pharmacologic comfort measures Outcome: Progressing   Problem: Health Behavior/Discharge Planning: Goal: Ability to manage health-related needs will improve Outcome: Progressing   Problem: Clinical Measurements: Goal: Ability to maintain clinical measurements within normal limits will improve Outcome: Progressing   Problem: Skin Integrity: Goal: Risk for impaired skin integrity will decrease Outcome: Progressing   Problem: Safety: Goal: Ability to remain free from injury will improve Outcome: Progressing   Problem: Pain Managment: Goal: General experience of comfort will improve Outcome: Progressing

## 2019-05-21 LAB — GLUCOSE, CAPILLARY
Glucose-Capillary: 156 mg/dL — ABNORMAL HIGH (ref 70–99)
Glucose-Capillary: 177 mg/dL — ABNORMAL HIGH (ref 70–99)

## 2019-05-21 MED ORDER — BUPRENORPHINE HCL-NALOXONE HCL 8-2 MG SL SUBL
1.0000 | SUBLINGUAL_TABLET | Freq: Every day | SUBLINGUAL | Status: DC
  Administered 2019-05-21: 1 via SUBLINGUAL

## 2019-05-21 MED ORDER — METOPROLOL TARTRATE 25 MG PO TABS: 25.0000 mg | ORAL_TABLET | Freq: Three times a day (TID) | ORAL | 0 refills | Status: DC

## 2019-05-21 MED ORDER — METOPROLOL TARTRATE 25 MG PO TABS: 25.0000 mg | ORAL_TABLET | Freq: Three times a day (TID) | ORAL | Status: DC

## 2019-05-21 MED ORDER — BUPRENORPHINE HCL-NALOXONE HCL 8-2 MG SL SUBL
1.0000 | SUBLINGUAL_TABLET | Freq: Every day | SUBLINGUAL | Status: DC
  Filled 2019-05-21: qty 1

## 2019-05-21 MED ORDER — BUPRENORPHINE HCL-NALOXONE HCL 8-2 MG SL SUBL: 1.0000 | SUBLINGUAL_TABLET | Freq: Two times a day (BID) | SUBLINGUAL | 0 refills | Status: DC

## 2019-05-21 MED FILL — BUPRENORPHIN-NALOXON 8-2 MG: 8-2 | 5 days supply | Qty: 10 | Fill #0

## 2019-05-21 NOTE — TOC Initial Note (Signed)
Transition of Care [facility]) - Initial/Assessment Note    Patient Details  Name: Mark [name] MRN: [id number] Date of Birth: 09-19-1965  [facility]) CM/SW Contact:    Zenon Mayo, RN Phone Number: 05/21/2019, 1:33 PM  Clinical Narrative:                 Patient is for dc today, he has transportation, he has funds to get his meds.  He has no other needs.  Expected Discharge Plan: Home/Self Care Barriers to Discharge: No Barriers Identified   Patient Goals and CMS Choice Patient states their goals for this hospitalization and ongoing recovery are:: get better CMS Medicare.gov Compare Post Acute Care list provided to:: Patient Choice offered to / list presented to : NA  Expected Discharge Plan and Services Expected Discharge Plan: Home/Self Care   Discharge Planning Services: CM Consult   Living arrangements for the past 2 months: Single Family Home Expected Discharge Date: 05/21/19                 DME Agency: (NA)       HH Arranged: NA          Prior Living Arrangements/Services Living arrangements for the past 2 months: Single Family Home Lives with:: Parents Patient language and need for interpreter reviewed:: Yes Do you feel safe going back to the place where you live?: Yes      Need for Family Participation in Patient Care: No (Comment) Care giver support system in place?: No (comment)   Criminal Activity/Legal Involvement Pertinent to Current Situation/Hospitalization: No - Comment as needed  Activities of Daily Living      Permission Sought/Granted                  Emotional Assessment Appearance:: Appears stated age Attitude/Demeanor/Rapport: Engaged Affect (typically observed): Appropriate Orientation: : Oriented to Self, Oriented to Place, Oriented to  Time, Oriented to Situation Alcohol / Substance Use: Illicit Drugs Psych Involvement: No (comment)  Admission diagnosis:  Alcohol withdrawal ([place]) [F10.239] Bigeminy  [I49.8] Frequent PVCs [I49.3] Alcohol withdrawal syndrome with complication [facility]) 123456 Patient Active Problem List   Diagnosis Date Noted  . Bigeminy   . Frequent PVCs   . Withdrawal syndrome ([place]) 04/12/2019  . Trigeminy 02/12/2019  . Muscle spasm of back 11/09/2018  . Elevated liver enzymes 11/09/2018  . Hyponatremia 08/15/2018  . Elevated LFTs 08/15/2018  . Neurogenic pain 08/10/2018  . Osteoarthritis of hip (Right) 04/07/2018  . Spondylosis without myelopathy or radiculopathy, lumbar region 03/23/2018  . Other specified dorsopathies, sacral and sacrococcygeal region 03/23/2018  . Chronic hip pain (Right) 03/23/2018  . Chronic sacroiliac joint pain (Left) 03/23/2018  . Alcohol withdrawal ([place]) 12/31/2017  . Pain management contract broken 12/15/2017  . Dyspepsia   . Opioid-induced hyperalgesia (with oxycodone) 02/11/2017  . Allodynia (Lower extremities) 12/26/2016  . Hyperalgesia (Lower extremities) 12/26/2016  . Sympathetic pain (lower extremity) 09/26/2016  . Lower extremity neuropathy (Primary Area of Pain) (Bilateral) (R>L) 09/26/2016  . Lumbar facet syndrome (Bilateral) (R>L) 08/22/2016  . Chronic sacroiliac joint pain (Bilateral) (R>L) 08/22/2016  . Chronic lower extremity pain (Bilateral) (R>L) 06/26/2016  . Opiate use 05/14/2016  . Occipital headache (Bilateral) (R>L) 04/16/2016  . Chronic musculoskeletal pain 04/15/2016  . 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 Major depressive disorder, recurrent episode, moderate ([place]) 04/10/2016  . Long term current use of opiate analgesic 03/18/2016  . Long term prescription opiate use 03/18/2016  . Chronic neck pain (  Bilateral)(R>L) 03/18/2016  . Cervical fusion syndrome 03/18/2016  . 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 Chronic knee pain  (Bilateral) (R>L) 03/18/2016  . GAD (generalized anxiety disorder) 12/06/2015  . Alcohol use disorder, severe, in early remission ([place]) 12/06/2015  . GERD  (gastroesophageal reflux disease) 07/24/2015  . Chronic anxiety 12/19/2014  . Erectile dysfunction 09/16/2014  . Poor dentition 07/28/2014  . Chronic maxillary sinusitis 07/28/2014  . Vitamin D insufficiency 07/05/2014  . Chronic fatigue 07/04/2014  . Chronic radicular low back pain (Third area of Pain) (Right) (to calf) 06/02/2014  . Chronic shoulder pain (Right) 05/12/2014  . Major depressive disorder, recurrent episode, severe ([place])   . Alcohol use disorder, severe, dependence ([place])   . 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 Hypokalemia   . Opioid use disorder, moderate, dependence ([place]) 01/22/2014    Class: Acute  . Paroxysmal VT ([place]) 12/19/2013  . Chronic low back pain (Secondary area of Pain) (Bilateral) (R>L) 12/28/2012  . Chronic pain syndrome 12/28/2012  . Diarrhea 12/28/2012  . History of alcohol abuse 12/28/2012  . Alcohol intoxication ([place]) 06/09/2012  . 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 Nonsustained ventricular tachycardia ([place]) 01/20/2012  . Alcohol abuse 01/19/2012  . Tobacco abuse 01/19/2012  . Essential hypertension   . Hyperlipidemia    PCP:  Patient, No Pcp Per Pharmacy:   [place], [place] - [address] [place] [address] Phone: [phone] Fax: [phone]  [place], [place]. [address] [place] [address] Phone: [phone] Fax: [phone]     Social Determinants of Health (SDOH) Interventions    Readmission Risk Interventions Readmission Risk Prevention Plan 05/21/2019  Transportation Screening Complete  PCP or Specialist Appt within 3-5 Days Complete  HRI or [place] Complete  Social Work Consult for [place] Planning/Counseling Complete  Palliative Care Screening Not Applicable  Medication Review [employer]) Complete  Some recent data might be hidden

## 2019-05-21 NOTE — Progress Notes (Signed)
Subjective: HD#1 Events Overnight: no events   Patient was seen this morning on rounds. He denies any chest pain, SOB at this time.   We discussed the plan for today, including anticipated discharged and dosing of Suboxone on discharge. He is in agreement. All questions and concerns addressed.   Objective:  Vital signs in last 24 hours: Vitals:   05/21/19 0000 05/21/19 0114 05/21/19 0154 05/21/19 0530  BP:  (!) 165/98 (!) 147/106 138/90  Pulse:  (!) 54 (!) 55 64  Resp:    16  Temp:    98 F (36.7 C)  TempSrc:    Oral  SpO2:  97%  97%  Weight: 100.6 kg   101 kg   Supplemental O2: none  Physical Exam: Physical Exam  Constitutional: He is oriented to person, place, and time and well-developed, well-nourished, and in no distress.  HENT:  Head: Normocephalic and atraumatic.  Eyes: EOM are normal.  Cardiovascular: Normal rate and intact distal pulses.  Pulmonary/Chest: Effort normal. He has wheezes (mild upper respiratory course breath sounds).  Abdominal: Soft. He exhibits no distension. There is no abdominal tenderness.  Musculoskeletal:        General: No edema. Normal range of motion.     Cervical back: Normal range of motion.  Neurological: He is alert and oriented to person, place, and time.  Skin: Skin is warm.    Filed Weights   05/20/19 0253 05/21/19 0000 05/21/19 0530  Weight: 101.9 kg 100.6 kg 101 kg     Intake/Output Summary (Last 24 hours) at 05/21/2019 0554 Last data filed at 05/21/2019 0051 Gross per 24 hour  Intake 1650 ml  Output 920 ml  Net 730 ml    Risk Score:  n/a  Pertinent labs/Imaging: CBC Latest Ref Rng & Units 05/20/2019 05/19/2019 05/16/2019  WBC 4.0 - 10.5 K/uL 8.9 9.7 8.8  Hemoglobin 13.0 - 17.0 g/dL 15.6 17.8(H) 18.2(H)  Hematocrit 39.0 - 52.0 % 47.1 53.6(H) 54.0(H)  Platelets 150 - 400 K/uL 246 303 307    CMP Latest Ref Rng & Units 05/20/2019 05/19/2019 05/16/2019  Glucose 70 - 99 mg/dL 142(H) 263(H) 164(H)  BUN 6 - 20 mg/dL 5(L)  <5(L) <5(L)  Creatinine 0.61 - 1.24 mg/dL 0.89 0.80 0.75  Sodium 135 - 145 mmol/L 136 132(L) 129(L)  Potassium 3.5 - 5.1 mmol/L 4.2 3.9 3.7  Chloride 98 - 111 mmol/L 97(L) 94(L) 93(L)  CO2 22 - 32 mmol/L 25 23 22   Calcium 8.9 - 10.3 mg/dL 9.0 9.3 9.1  Total Protein 6.5 - 8.1 g/dL 5.7(L) 6.4(L) 6.7  Total Bilirubin 0.3 - 1.2 mg/dL 1.0 0.4 0.6  Alkaline Phos 38 - 126 U/L 105 110 124  AST 15 - 41 U/L 28 33 44(H)  ALT 0 - 44 U/L 29 32 41    No results found.     Assessment/Plan:  Principal Problem:   Opioid use disorder, moderate, dependence ([name]) Active Problems:   Essential hypertension   Alcohol intoxication ([place])   Chronic pain syndrome   Paroxysmal VT ([name])   Long Q-T syndrome   GERD (gastroesophageal reflux disease)   Major depressive disorder, recurrent episode, moderate ([name])   Alcohol withdrawal ([place])   Trigeminy   Patient summary: Mark [name] is a 54 y.o. with pertinent PMH of ETOH-use disorder (hx. Seizures), IBS, HTN, HLD, COPD, Long QT syndrome, and chronic painwho presented with chest pain and tachycardia and admit for ETOH intoxication and ventricular tachycardia  on hospital day 0  #  Opioid-use Disorder: Patient is stable for discharge today.  We will set him up with the Suboxone clinic at [facility].  We will send him out on Suboxone with close follow-up.  #Ventricular arrhythmia with trigeminy and bigeminy Patient is having some bradycardia on his current dose of metoprolol 50 mg 3 times daily.  We will decrease his dose to 25 mg 3 times daily for ventricular arrhythmias.  #Chronic Pain Syndrome -Patient should no longer take oxycodone for his chronic pain.  We will start him on Suboxone for opiate use disorder.  Taking oxycodone or other opioids will precipitate withdrawal.  The patient was counseled on this.  #Pre-diabetes: #Hyperglycemia: - Follow-up with outpatient PCP for repeat hemoglobin A1c   Diet: Heart  Healthy IVF: None,None VTE: Enoxaparin Code: Full PT/OT recs: None TOC recs: none   Dispo: Anticipated discharge today.    [name], D.O. MCIMTP, PGY-1 Date 05/21/2019 Time 5:54 AM

## 2019-05-21 NOTE — Progress Notes (Signed)
Discharge education and medication education given to patient with teach back. Education on increasing activity slowly and when to call MD given.  All questions and concerns answered. Peripheral IV and telemetry leads removed. All patient belongings given to patient. Patient walked to main entrance.

## 2019-05-23 NOTE — Discharge Summary (Addendum)
Name: Mark [name] MRN: [id number] DOB: 04-14-1965 54 y.o. PCP: Patient, No Pcp Per  Date of Admission: 05/19/2019 10:54 AM Date of Discharge: 05/21/2019 Attending Physician: Velna Ochs, MD  Discharge Diagnosis: 1.  Acute ethanol intoxication  Discharge Medications: Allergies as of 05/21/2019      Reactions   Cozaar [losartan] Anaphylaxis   Librium [chlordiazepoxide] Nausea Only, Other (See Comments)   "Tears up my guts" (SEVERE NAUSEA- COMES CLOSE TO VOMITING- SEVERE DRY HEAVES)   Lisinopril Anaphylaxis   Other reaction(s): Angioedema (ALLERGY/intolerance)   Tylenol [acetaminophen] Other (See Comments)   CANNOT TAKE DUE TO LIVER ISSUE(S)      Medication List    STOP taking these medications   baclofen 10 MG tablet Commonly known as: LIORESAL   LORazepam 1 MG tablet Commonly known as: ATIVAN   oxyCODONE 5 MG immediate release tablet Commonly known as: Oxy IR/ROXICODONE   traZODone 100 MG tablet Commonly known as: DESYREL     TAKE these medications   albuterol 108 (90 Base) MCG/ACT inhaler Commonly known as: Ventolin HFA Inhale 2 puffs into the lungs every 6 (six) hours as needed for wheezing or shortness of breath.   buprenorphine-naloxone 8-2 mg Subl SL tablet Commonly known as: SUBOXONE Place 1 tablet under the tongue in the morning and at bedtime.   famotidine 10 MG chewable tablet Commonly known as: PEPCID AC Chew 10 mg by mouth 2 (two) times daily.   gabapentin 100 MG capsule Commonly known as: Neurontin Take 2 capsules (200 mg total) by mouth 3 (three) times daily.   Magnesium Oxide 500 MG Caps Take 1 capsule (500 mg total) by mouth 2 (two) times daily at 8 am and 10 pm. What changed: when to take this   metoprolol tartrate 25 MG tablet Commonly known as: LOPRESSOR Take 1 tablet (25 mg total) by mouth every 8 (eight) hours. What changed:   medication strength  how much to take  when to take this   naproxen sodium 220 MG tablet  Commonly known as: ALEVE Take 220-440 mg by mouth 2 (two) times daily as needed (for pain).   VITAMIN D3 PO Take 1 capsule by mouth daily.       Disposition and follow-up:   Mark [name] was discharged from [facility] in Stable condition.  At the hospital follow up visit please address:  1.  Follow up: 1) Ventricular arrhythmia - metoprolol dose, 2) Pre-diabetes, 3) OUD - suboxone   2.  Labs / imaging needed at time of follow-up: none  3.  Pending labs/ test needing follow-up: none  Follow-up Appointments: Follow-up Information    [facility]. Go on 05/25/2019.   Why: Please go to your opiate use disorder clinic appointment at [facility] health internal medicine at the address listed above.  Your appointment time is at 830. Contact information: [address] [place] Hospital Course by problem list: 1. Acute ETOH intoxication/opioid use disorder - patient presented on 03/10 with signs and symptoms concerning for acute alcohol intoxication in the setting of opioid withdrawal.  Patient states that he does have a history of excessive alcohol use once he runs out of his opioid medications.  He has been on opioid pain medication for the last 6 years for chronic pain syndrome.  Previously on oxycodone but was recently let go from his provider due to taking more pain medicine than  prescribed.  He does admit to experiencing opioid withdrawal symptoms after discontinue opioid medications including diarrhea, tachycardia, tremor, nausea, vomiting.  He tries to deal with the symptoms by drinking alcohol which has led to multiple hospitalizations and the past.  He does admit to having a previous history of alcohol withdrawal seizures.  During this admission the patient was started on CIWA with Ativan and a Librium taper.  He did not have any signs and symptoms of alcohol withdrawal and benzodiazepine therapy  was subsequently discontinued.  On further history the patient does admit to significant personal issues as a result of his substance use including divorce and estrangement from his children.  He states that his last refill of oxycodone lasted him up until about a week ago.  Patient was offered Suboxone therapy as a means to deal with his opioid use disorder.  Patient is agreeable to this plan.  During this hospitalization the patient was started on Suboxone therapy with close follow-up with the [facility] health internal medicine OUD clinic.  Patient was discharged with Suboxone therapy and an appointment was made to follow-up with the OUD clinic on Tuesday 03/16.  2.  Ventricular arrhythmia with trigeminy/bigeminy -patient has a history of ventricular arrhythmia.  On admission, cardiology was consulted due to chest pain.  Patient had negative troponins and EKG showing ventricular arrhythmia with trigeminy/bigeminy.  Cardiology recommended to continue metoprolol daily.  Patient was started on 50 mg 3 times daily with some bradycardia.  He was decreased to 25 mg 3 times daily and discharged on the disc.  He will need close follow-up to make sure this medication is dosed correctly.  3.  GERD -patient was continued on Pepcid 10 mg twice daily for his gastroesophageal reflux.  Patient does have a history of fundoplication surgery for resistant GERD.  4.  Hyperglycemia/prediabetes -hemoglobin A1c on admission shows evidence of prediabetes.  Patient will need a repeat hemoglobin A1c in the outpatient setting for further evaluation and management.  Discharge Vitals:   BP (!) 133/99 (BP Location: Right Arm)   Pulse 67   Temp 98.5 F (36.9 C) (Oral)   Resp 20   Wt 101 kg   SpO2 96%   BMI 27.10 kg/m   Pertinent Labs, Studies, and Procedures:  CBC Latest Ref Rng & Units 05/20/2019 05/19/2019 05/16/2019  WBC 4.0 - 10.5 K/uL 8.9 9.7 8.8  Hemoglobin 13.0 - 17.0 g/dL 15.6 17.8(H) 18.2(H)  Hematocrit 39.0 - 52.0 %  47.1 53.6(H) 54.0(H)  Platelets 150 - 400 K/uL 246 303 307   CMP Latest Ref Rng & Units 05/20/2019 05/19/2019 05/16/2019  Glucose 70 - 99 mg/dL 142(H) 263(H) 164(H)  BUN 6 - 20 mg/dL 5(L) <5(L) <5(L)  Creatinine 0.61 - 1.24 mg/dL 0.89 0.80 0.75  Sodium 135 - 145 mmol/L 136 132(L) 129(L)  Potassium 3.5 - 5.1 mmol/L 4.2 3.9 3.7  Chloride 98 - 111 mmol/L 97(L) 94(L) 93(L)  CO2 22 - 32 mmol/L 25 23 22   Calcium 8.9 - 10.3 mg/dL 9.0 9.3 9.1  Total Protein 6.5 - 8.1 g/dL 5.7(L) 6.4(L) 6.7  Total Bilirubin 0.3 - 1.2 mg/dL 1.0 0.4 0.6  Alkaline Phos 38 - 126 U/L 105 110 124  AST 15 - 41 U/L 28 33 44(H)  ALT 0 - 44 U/L 29 32 41     Discharge Instructions: Discharge Instructions    Diet - low sodium heart healthy   Complete by: As directed    Discharge instructions   Complete by: As  directed    You were hospitalized for alcohol intoxication and opioid withdrawal. Thank you for allowing [place] to be part of your care.   We arranged for you to follow up at:   1) [facility] - Tuesday, March 16th @ 8:45 am. Please arrive 15 minutes early and bring your medications. We are located in the basement of the [facility] hospital on the [place] side, below the emergency department. Call [phone] if you have questions.  [place], [place] [address]  For your opioid use disorder, please start taking suboxone twice daily as prescribed. You can pick this up from the [facility].   Please call our clinic if you have any questions or concerns, we may be able to help and keep you from a long and expensive emergency room wait. Our clinic and after hours phone number is [phone], the best time to call is Monday through Friday 9 am to 4 pm but there is always someone available 24/7 if you have an emergency. If you need medication refills please notify your pharmacy one week in advance and they will send [place] a request.   Increase activity slowly   Complete  by: As directed       Signed: [name], MD 05/23/2019, 8:52 AM   Pager: [phone]

## 2019-05-25 ENCOUNTER — Other Ambulatory Visit: Payer: Self-pay

## 2019-05-25 ENCOUNTER — Ambulatory Visit (INDEPENDENT_AMBULATORY_CARE_PROVIDER_SITE_OTHER): Payer: Self-pay | Admitting: Internal Medicine

## 2019-05-25 VITALS — BP 142/89 | HR 70 | Temp 98.3°F | Ht 76.0 in | Wt 230.9 lb

## 2019-05-25 DIAGNOSIS — G8929 Other chronic pain: Secondary | ICD-10-CM

## 2019-05-25 DIAGNOSIS — M549 Dorsalgia, unspecified: Secondary | ICD-10-CM

## 2019-05-25 DIAGNOSIS — I10 Essential (primary) hypertension: Secondary | ICD-10-CM

## 2019-05-25 DIAGNOSIS — F1190 Opioid use, unspecified, uncomplicated: Secondary | ICD-10-CM

## 2019-05-25 DIAGNOSIS — F1199 Opioid use, unspecified with unspecified opioid-induced disorder: Secondary | ICD-10-CM

## 2019-05-25 DIAGNOSIS — F418 Other specified anxiety disorders: Secondary | ICD-10-CM

## 2019-05-25 DIAGNOSIS — K219 Gastro-esophageal reflux disease without esophagitis: Secondary | ICD-10-CM

## 2019-05-25 DIAGNOSIS — F1120 Opioid dependence, uncomplicated: Secondary | ICD-10-CM

## 2019-05-25 MED ORDER — CITALOPRAM HYDROBROMIDE 10 MG PO TABS: 10.0000 mg | ORAL_TABLET | Freq: Every day | ORAL | 2 refills | Status: DC

## 2019-05-25 MED ORDER — BUPRENORPHINE HCL-NALOXONE HCL 8-2 MG SL SUBL: SUBLINGUAL_TABLET | SUBLINGUAL | 0 refills | Status: DC

## 2019-05-25 MED FILL — CITALOPRAM HBR 10 MG TABLET: 10 | 30 days supply | Qty: 30 | Fill #0

## 2019-05-25 MED FILL — BUPRENORPHIN-NALOXON 8-2 MG: 8-2 | 14 days supply | Qty: 35 | Fill #0

## 2019-05-25 NOTE — Patient Instructions (Addendum)
[name],  It was a pleasure to see you today. Thank you for coming in.   I am glad that the Suboxone is working for you. Please increase the medication to 2.5 tabs daily, take one in the morning, half in the afternoon, and 1 in the evening.  Please start taking citalopram daily.   Please return to clinic in 2 week or sooner if needed.   Thank you again for coming in.   Asencion Noble.D.

## 2019-05-25 NOTE — Progress Notes (Signed)
Internal Medicine Clinic Attending  I saw and evaluated the patient.  I personally confirmed the key portions of the history and exam documented by Dr. Krienke and I reviewed pertinent patient test results.  The assessment, diagnosis, and plan were formulated together and I agree with the documentation in the resident's note.    

## 2019-05-25 NOTE — Assessment & Plan Note (Signed)
Patient has a history of anxiety and depression, had been diagnosed with bipolar however denies having any manic episodes. He reports feeling very down at times. He is not on any anti-depressants at this time. He reports he has seen a therapist in the past however not seeing anyone at this time. PHQ-9 today is 12. Discussed starting him on Celexa and he was in agreement.   -Start celexa 10 mg daily -Likely will need behavioral health referral in the future

## 2019-05-25 NOTE — Progress Notes (Signed)
05/25/2019  [name] presents for buprenorphine/naloxone intake visit.   I have reviewed EPIC data including labwork which was available.  I have reviewed outside records provided by patient if available.  The salient points were confirmed with the patient.  Review of substance use history (first use, substances used, any illicit purchases): Mr. Edley has a history of chronic back pain and had been started on opioid treatment many years ago. He required increasing doses of opioids and was started on methadone for his chronic pain issues. He reports pain in his back, shoulders, and neck and required multiple surgical interventions. He was referred to a pain clinic where he was being treated with oxycodone, he started using increasing doses at that time and was fired from the pain clinic about 3 months ago. He endorses multiple withdrawal symptoms when he runs out of opioids including tremors, diarrhea, tachycardia, nausea and vomiting. He also increases his EtOH use when this happens which causes him to have EtOH use disorder. He has had multiple social issues related to his opioid use including divorce and inability to maintain a job.   Admitted to the hospital from 05/19/19-05/21/19 from acute EtOH intoxication 2/2 being out of his opioids. He was on a librium for his EtOH withdrawal. He was diagnosed with moderate opioid use disorder at that time and had been started on Suboxone 8-2 mg twice a day. Since then he reports that he has been doing well, he denies any recent relapses or cravings, reports mild withdrawal symptoms between doses of Suboxone, including feeling "antsy". He has not had any alcohol. He feels that his pain is better controlled on Suboxone and that it is helping with his withdrawal symptoms.    Last substance used: 2-3 weeks ago, oxycodone  If last substance not an opioid, last opioid used (type, dose, route, withdrawal symptoms): Oxycodone, 10 mg, pills, developed  withdrawal symptoms requiring hospitalization  Mental Health History: ?Bipolar with depression, anxiety  Current counseling/behavioural health provider: None  This patient has Opioid Use Disorder by following DSM-V criteria: Keep those that apply.  - Opioids taken in larger amounts or over a longer period than intended - Persistent desire to cut down - A great deal of time is spent to obtain/use/recover from the opioid - Cravings to use opioids - Use resulting in a failure to fulfill major role obligations - Continue opioid use despite persistent social or interpersonal problems - Important activities are given up or reduced because of opioid use - Recurrent opioid use in situations in which it is physically hazardous - Use despite knowledge of health problems caused by opioids - Tolerance - Withdrawal  Past Medical History:  Diagnosis Date  . Alcohol withdrawal ([place])   . Allergy   . Anxiety Dx 2014  . COPD (chronic obstructive pulmonary disease) ([place])   . Drug abuse ([place])    pt reports opioid dependence due to previous back surgeries  . Enlarged prostate   . ETOH abuse   . GERD (gastroesophageal reflux disease) Dx 2003  . Headache(784.0)   . Hepatitis   . Hyperlipidemia Dx 2000  . Hypertension Dx 2011  . IBS (irritable bowel syndrome)    alternates constiaption/ diarrhea  . Irregular heart beat   . 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 Mental disorder   . Neuromuscular disorder ([place])    hands hurt   . Neuropathy 06/07/2013  . Seizure due to alcohol withdrawal ([place]) 01/23/2014   Per patient report  . Shortness  of breath   . Withdrawal seizures ([place]) 2014   last seizure 2014 etoh with drawal    Current Outpatient Medications on File Prior to Visit  Medication Sig Dispense Refill  . albuterol (VENTOLIN HFA) 108 (90 Base) MCG/ACT inhaler Inhale 2 puffs into the lungs every 6 (six) hours as needed for wheezing or shortness of breath. 18 g 3  . buprenorphine-naloxone  (SUBOXONE) 8-2 mg SUBL SL tablet Place 1 tablet under the tongue in the morning and at bedtime. 10 tablet 0  . Cholecalciferol (VITAMIN D3 PO) Take 1 capsule by mouth daily.     . famotidine (PEPCID AC) 10 MG chewable tablet Chew 10 mg by mouth 2 (two) times daily.     Marland Kitchen gabapentin (NEURONTIN) 100 MG capsule Take 2 capsules (200 mg total) by mouth 3 (three) times daily. 180 capsule 5  . Magnesium Oxide 500 MG CAPS Take 1 capsule (500 mg total) by mouth 2 (two) times daily at 8 am and 10 pm. (Patient taking differently: Take 1 capsule by mouth daily. ) 60 capsule 11  . metoprolol tartrate (LOPRESSOR) 25 MG tablet Take 1 tablet (25 mg total) by mouth every 8 (eight) hours. 90 tablet 0  . naproxen sodium (ALEVE) 220 MG tablet Take 220-440 mg by mouth 2 (two) times daily as needed (for pain).     No current facility-administered medications on file prior to visit.    Physical Exam  Vitals:   05/25/19 0851 05/25/19 0855  BP:  (!) 142/89  Pulse:  70  Temp:  98.3 F (36.8 C)  TempSrc:  Oral  SpO2:  96%  Weight: 230 lb 14.4 oz (104.7 kg)   Height: 6\' 4"  (1.93 m)    General: Middle aged male, NAD, sitting in chair Cardiac: RRR, no m/r/g Pulmonary: CTABL, no wheezing, rhonchi, rales Abdomen: Soft, non-tender, non-distended  Clinical Opiate Withdrawal Scale: bold applicable COWS scoring   - Resting HR:    - 0 for < 80   - 1 for 81 - 100   - 2 for 101 - 120   - 4 for > 120  - Sweating:   - 0 for no chills/flushing   - 1 for subjective chills/flushing   - 3 for beads of sweat on brow/face   - 4 for sweat streaming off of face  - Restlessness:    - 0 for able to sit still   - 1 for subjective difficulty sitting still   - 3 for frequent shifting or extraneous movement   - 5 for unable to sit still for more than a few seconds  - Pupil size:    - 0 for pinpoint or normal   - 1 for possibly larger than normal   - 2 for moderately dilated   - 5 for only iris rim visible  - Bone/joint  pain:    - 0 for not present   - 1 for mild diffuse discomfort   - 2 severe diffuse aching   - 4 for objectively rubbing joints/muscles and obviously in pain  - Runny nose/tearing:    - 0 for not present   - 1 for stuffy nose/moist eyes   - 2 for nose running/tearing   - 4 for nose constantly running or tears streaming down cheeks  - GI Upset:    - 0 for no GI symptoms   - 1 for stomach cramps   - 2 for nausea or loose stool   - 3 for  vomiting or diarrhea   - 5 for multiple episodes of vomiting or diarrhea  - Tremor observation of outstretched hands:    - 0 for no tremor   - 1 for tremor can be felt but not observed   - 2 for slight tremor observable   - 4 for gross tremor or muscle twitching  - Yawning:    - 0 for no yawning   - 1 for yawning once or twice during assessment   - 2 for yawning three or more times during assessment   - 4 for yawning several times per minute  - Anxiety or irritability:    - 0 for none   - 1 for patient reports increasing irritability or anxiousness   - 2 for patient obviously irritable/anxious   - 4 for patient so irritable/anxious that assessment is difficult  - Gooseflesh:    - 0 for skin is smooth   - 3 for piloerection of skin can be felt or seen   - 5 for prominent piloerection  TOTAL: 5  Assessment/Plan:   Based on a review of the patient's medical history including substance use and mental health factors, and physical exam, KEENE SCHLICKER is a suitable candidate for MAT with buprenorphine/naloxone.  UDS ordered this visit.    I have discussed HIV and Hepatitis C screening with this patient.    I will place orders for CMET for baseline LFT evaluation if needed.    Home Induction:   I have instructed the patient how to appropriately take this medication, including placing under the tongue with head relaxed for 10 minutes and allowing to dissolve without chewing or swallowing tab/film, and with nothing to eat or drink in the  subsequent 15 minutes.  They have been told not to start taking the medication until they have significant signs of withdrawal and I have explained the concept of precipitated withdrawal with the patient.    Intervisit Care:  We discussed this medication must be kept in a safe place and away from children.   We will see the patient back in 1 week in clinic, with options for a sooner appointment based on patient and provider preference.   Patient was encouraged to call the office and speak with the MD on call for any urgent concerns.    Asencion Noble, MD 05/25/2019 9:32 AM

## 2019-05-25 NOTE — Assessment & Plan Note (Addendum)
This is a 54 year old male with a history of EtOH use disorder, HTN, GERD, chronic pain, and OUD who was recently started on Suboxone in the hospital for his OUD presenting to establish care for OUD. He reports doing well after starting on the Suboxone. Denies any issues taking the medications. He has not had any other substance use since then, has not had any alcohol or opioids. He reports mild withdrawal symptoms between tablets, including feeling antsy, sweating, and feeling anxious. Does not feel like the medication lasts long enough. His COWs score today is 5. His main triggers for opioid use is his chronic pain conditions. Since he may be developing withdrawal symptoms between doses will add additional 0.5 tab in the afternoon. CMP during hospitalization was unremarkable. Hep C negative in 2020. HIV negative in 2014. Will hold off on repeating HIV test, does not seem to be high risk given no IV drug use.   -Increase Suboxone to 2.5 mg daily -Check UDS today -F/u in 2 weeks to assess efficacy

## 2019-05-29 LAB — TOXASSURE SELECT,+ANTIDEPR,UR

## 2019-06-08 ENCOUNTER — Other Ambulatory Visit: Payer: Self-pay

## 2019-06-08 ENCOUNTER — Ambulatory Visit (HOSPITAL_COMMUNITY)
Admission: RE | Admit: 2019-06-08 | Discharge: 2019-06-08 | Disposition: A | Discharge status: Home / Self Care | Payer: Self-pay | Source: Ambulatory Visit | Attending: Family Medicine | Admitting: Family Medicine

## 2019-06-08 ENCOUNTER — Ambulatory Visit (HOSPITAL_COMMUNITY): Payer: Self-pay

## 2019-06-08 ENCOUNTER — Ambulatory Visit (INDEPENDENT_AMBULATORY_CARE_PROVIDER_SITE_OTHER): Payer: Self-pay | Admitting: Internal Medicine

## 2019-06-08 VITALS — BP 142/62 | HR 42 | Temp 98.3°F | Ht 76.0 in | Wt 226.8 lb

## 2019-06-08 DIAGNOSIS — F1199 Opioid use, unspecified with unspecified opioid-induced disorder: Secondary | ICD-10-CM

## 2019-06-08 DIAGNOSIS — F1120 Opioid dependence, uncomplicated: Secondary | ICD-10-CM | POA: Insufficient documentation

## 2019-06-08 DIAGNOSIS — I498 Other specified cardiac arrhythmias: Secondary | ICD-10-CM

## 2019-06-08 DIAGNOSIS — F418 Other specified anxiety disorders: Secondary | ICD-10-CM

## 2019-06-08 DIAGNOSIS — F1190 Opioid use, unspecified, uncomplicated: Secondary | ICD-10-CM

## 2019-06-08 MED ORDER — BUPRENORPHINE HCL-NALOXONE HCL 8-2 MG SL SUBL: SUBLINGUAL_TABLET | SUBLINGUAL | 0 refills | Status: DC

## 2019-06-08 MED FILL — BUPRENORPHIN-NALOXON 8-2 MG: 8-2 | 14 days supply | Qty: 35 | Fill #0

## 2019-06-08 NOTE — Assessment & Plan Note (Signed)
Patient reports improvement in his mood, had not started the Celexa that was prescribed on his last visit.  He reports reading up on some of the side effects and was not interested in starting it at this time.  PHQ-9 today is 7, down from 12 on his last visit.  Discussed that we can hold off on starting this medication however to contact us if he is interested again.

## 2019-06-08 NOTE — Assessment & Plan Note (Signed)
Patient seems to be doing well with Suboxone 2.5 tabs daily relapses, or symptoms of withdrawal.  He feels that the increase Suboxone dose is doing better for his symptoms.  Does have some mild stress however does not feel that this will contribute to relapse.  Feels like his mood is better, has not started Celexa was prescribed on his last visit.  PDMP reviewed and appropriate.  Overall seems to be doing very well on this medication.  -Continue Suboxone 8-2 2.5 tabs daily -Tox assure today -Return to clinic in 1 month

## 2019-06-08 NOTE — Patient Instructions (Addendum)
[name],  It was a pleasure to see you today. Thank you for coming in.   Today we discussed your Suboxone therapy. I am glad this is working well for you! Please continue taking your current dose.  We also discussed your heart rate. We did an EKG and that looked okay, you are still having PVCs. Continue your medications as prescribed. Please contact us or your cardiologist if your get lightheaded, dizzy, or start having chest pain.   Please return to clinic in 1 month or sooner if needed.   Thank you again for coming in.   Asencion Noble.D.

## 2019-06-08 NOTE — Progress Notes (Signed)
   06/08/2019  [name] presents for follow up of opioid use disorder I have reviewed the prior induction visit, follow up visits, and telephone encounters relevant to opiate use disorder (OUD) treatment.   Current daily dose: Suboxone 8-2 mg 2.5 tabs daily  Date of Induction: 05/21/19  Current follow up interval, in weeks: 4 weeks  The patient has been adherent with the buprenorphine for OUD contract.   Last UDS Result: 05/25/19 appropriate for Suboxone therapy  HPI: This is a 54 year old male with moderate OUD who presented for follow up of this condition. He reports that he is doing very well with the current dose of Suboxone.  He denies any cravings, withdrawal symptoms, or relapses.  He feels that the Suboxone is actually working well for his leg pain and has not been wanting any opioids.  He also reports that his mood has overall been improved, no longer feeling depressed, he reports he did not start the citalopram that was prescribed on his last visit.  He does report some stress at home, his daughter has been sick and his ex-wife has been caring for her.  He feels that this is manageable and does not feel that this will contribute to any relapse.  He denies any other symptoms including chest pain, shortness of breath, palpitations, lightheadedness, lethargy, or other symptoms.  Exam:   Vitals:   06/08/19 0939 06/08/19 0940  BP:  (!) 142/62  Pulse:  (!) 42  Temp:  98.3 F (36.8 C)  TempSrc:  Oral  SpO2:  98%  Weight: 226 lb 12.8 oz (102.9 kg)   Height: 6\' 4"  (1.93 m)     General: Middle aged male, NAD, sitting in chair Cardiac: Bradycardic, regular rhythm, no m/r/g Pulmonary: CTABL, no wheezing, rhonchi, rales Abdomen: Soft, nontender, nondistended, normoactive bowel sounds  Assessment/Plan:  See Problem Based Charting in the Encounters Tab  Asencion Noble, MD  06/08/2019  10:05 AM

## 2019-06-08 NOTE — Assessment & Plan Note (Signed)
HR on evaluation was 42. 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  He follows with Dr. Terrence Dupont, last saw them 1 month ago.  He denies any chest pain, shortness of breath, lethargy, lightheadedness, dizziness, or palpitations.  He reports that he does have frequent PVCs and cannot sometimes get short of breath when that happens, however nothing occurred recently.  Obtained EKG, that showed normal sinus rhythm with frequent PVCs, heart rate 86.  Patient is on metoprolol 25 mg 3 times daily.  Since patient is asymptomatic and EKG showed frequent PVCs we will hold off on adjusting the medications.  Advised patient to contact us if he starts having symptoms.  Could decrease the metoprolol to 25 mg twice daily if he does.

## 2019-06-09 NOTE — Progress Notes (Signed)
Internal Medicine Clinic Attending  Case discussed with Dr. Krienke at the time of the visit.  We reviewed the resident's history and exam and pertinent patient test results.  I agree with the assessment, diagnosis, and plan of care documented in the resident's note.    

## 2019-06-09 NOTE — Addendum Note (Signed)
Addended by: Jodean Lima on: 06/09/2019 10:11 AM   Modules accepted: Level of Service

## 2019-06-11 LAB — TOXASSURE SELECT,+ANTIDEPR,UR

## 2019-06-21 MED FILL — BUPRENORPHIN-NALOXON 8-2 MG: 8-2 | 14 days supply | Qty: 35 | Fill #1

## 2019-06-23 MED FILL — GABAPENTIN 100 MG CAPSULE: 100 | 30 days supply | Qty: 180 | Fill #4

## 2019-07-05 MED FILL — BUPRENORPHIN-NALOXON 8-2 MG: 8-2 | 2 days supply | Qty: 5 | Fill #2

## 2019-07-06 ENCOUNTER — Ambulatory Visit (INDEPENDENT_AMBULATORY_CARE_PROVIDER_SITE_OTHER): Payer: Self-pay | Admitting: Internal Medicine

## 2019-07-06 ENCOUNTER — Other Ambulatory Visit: Payer: Self-pay

## 2019-07-06 VITALS — BP 145/56 | HR 59 | Temp 98.4°F | Wt 220.3 lb

## 2019-07-06 DIAGNOSIS — I426 Alcoholic cardiomyopathy: Secondary | ICD-10-CM

## 2019-07-06 DIAGNOSIS — F1010 Alcohol abuse, uncomplicated: Secondary | ICD-10-CM

## 2019-07-06 DIAGNOSIS — I472 Ventricular tachycardia: Secondary | ICD-10-CM

## 2019-07-06 DIAGNOSIS — F1120 Opioid dependence, uncomplicated: Secondary | ICD-10-CM

## 2019-07-06 DIAGNOSIS — F1199 Opioid use, unspecified with unspecified opioid-induced disorder: Secondary | ICD-10-CM

## 2019-07-06 DIAGNOSIS — I4729 Other ventricular tachycardia: Secondary | ICD-10-CM

## 2019-07-06 DIAGNOSIS — F1190 Opioid use, unspecified, uncomplicated: Secondary | ICD-10-CM

## 2019-07-06 MED ORDER — BUPRENORPHINE HCL-NALOXONE HCL 8-2 MG SL SUBL: SUBLINGUAL_TABLET | SUBLINGUAL | 1 refills | Status: DC

## 2019-07-06 MED FILL — BUPRENORPHIN-NALOXON 8-2 MG: 8-2 | 14 days supply | Qty: 35 | Fill #0

## 2019-07-06 NOTE — Patient Instructions (Addendum)
Mark [name],  It was a pleasure to see you today. I am glad you are doing well on suboxone. Today we talked about your alcohol use, please refrain from drinking alcohol if possible. It is important you follow up with your heart doctor as well.   Follow up with [place] again in 4 weeks for an in person appointment. If you have any questions or concerns, call our clinic at [phone] or after hours call [phone] and ask for the internal medicine resident on call.   Thank you!  Dr. Philipp Ovens

## 2019-07-06 NOTE — Assessment & Plan Note (Addendum)
Patient is doing well with suboxone 20 mg daily. Symptoms are well controlled, no cravings or relapse. Reports significant improvement in his mood since starting therapy and has begun mending relationships with his family members. He also notes significant improvement in his chronic pain which he considers a positive bonus. He is very appreciative for our care. He is filling his prescriptions through the [facility] outpatient pharmacy IM program which is affordable for him. Refill history is appropriate. Last Utox with low levels of valium metabolites, I suspect residual from recent hospitalization. Also positive for alcohol metabolites which we discussed as well (see alcohol abuse A&P). Repeat Utox today. If stable at next visit, could consider alternating 4 week telehealth visits.   ADDENDUM: Repeat Utox again with alcohol and valium metabolites. Alcohol use was already addressed this visit. Apparently valium can remain in urine for up to 6 weeks, which he received during his hospitalization in March. Plan for repeat Utox at next visit. If persistently positive at that visit, will need to be addressed.

## 2019-07-06 NOTE — Assessment & Plan Note (Addendum)
Patient reports significant decrease in his alcohol use since starting suboxone therapy, but admits to still drinking 1-2 beers a night. He no longer drinks liquor after his recent hospitalization. We discussed his last UDS results being positive for alcohol metabolites. He admits to drinking more than usual the night before for his daughter's birthday party. I advised patient abstain from drinking altogether if possible given his suboxone therapy as well as his alcohol induced cardiomyopathy. He expressed understanding and will work on it. Last LFTs checked 1 month ago were normal. He had a normal CT abdomen pelvis during recent hospitalization without evidence of cirrhosis. Unfortunately naltrexone therapy not an option while patient is on suboxone.

## 2019-07-06 NOTE — Assessment & Plan Note (Addendum)
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 He is prescribed metoprolol 25 mg q8h and follows with cardiology. Scheduled to follow up next month. Advised alcohol cessation.

## 2019-07-06 NOTE — Progress Notes (Signed)
   07/06/2019  Mark [name] presents for follow up of opioid use disorder I have reviewed the prior induction visit, follow up visits, and telephone encounters relevant to opiate use disorder (OUD) treatment.   Current daily dose: Suboxone 8-2 mg, 2.5 tablets daily   Date of Induction: 05/21/19  Current follow up interval, in weeks: 4 weeks  The patient has been adherent with the buprenorphine for OUD contract.   Last UDS Result: 06/08/19 Appropriate for buprenorphine and metabolite, also positive for low levels diazepam metabolites I suspect is residual from recent hospitalization, and high levels of alcohol metabolites.   HPI: For the details of today's visit, please refer to the assessment and plan.   Exam:   Vitals:   07/06/19 0940  BP: (!) 145/56  Pulse: (!) 59  Temp: 98.4 F (36.9 C)  TempSrc: Oral  SpO2: 99%  Weight: 220 lb 4.8 oz (99.9 kg)    Physical Exam Constitutional: NAD, appears comfortable Cardiovascular: Bradycardic, irregular, no murmurs  Pulmonary/Chest: CTAB, no wheezes, rales, or rhonchi.  Extremities: Warm and well perfused. No edema.  Psychiatric: Normal mood and affect   Assessment/Plan:  See Problem Based Charting in the Encounters Tab    Velna Ochs, MD  07/06/2019  9:47 AM

## 2019-07-09 LAB — TOXASSURE SELECT,+ANTIDEPR,UR

## 2019-07-28 ENCOUNTER — Other Ambulatory Visit: Payer: Self-pay | Admitting: *Deleted

## 2019-07-28 ENCOUNTER — Other Ambulatory Visit: Payer: Self-pay

## 2019-07-28 DIAGNOSIS — F1190 Opioid use, unspecified, uncomplicated: Secondary | ICD-10-CM

## 2019-07-28 NOTE — Telephone Encounter (Signed)
Sent in new request 

## 2019-07-28 NOTE — Telephone Encounter (Signed)
buprenorphine-naloxone (SUBOXONE) 8-2 mg SUBL SL tablet   Refill request @  [place], [place] - [address]. [phone] (Phone) [phone] (Fax)   Pt states he need it by Saturday before he goes out of town.

## 2019-07-28 NOTE — Telephone Encounter (Signed)
Patient received 4 week supply only three weeks ago. He should have enough to last until his appointment with [place] on 5/25. Let me know if he has run out early or if there is some other circumstance requiring this to be filled early. Otherwise we can address this next week.

## 2019-08-03 ENCOUNTER — Ambulatory Visit (HOSPITAL_COMMUNITY)
Admission: RE | Admit: 2019-08-03 | Discharge: 2019-08-03 | Disposition: A | Discharge status: Home / Self Care | Payer: Self-pay | Source: Ambulatory Visit | Attending: Internal Medicine | Admitting: Internal Medicine

## 2019-08-03 ENCOUNTER — Other Ambulatory Visit: Payer: Self-pay

## 2019-08-03 ENCOUNTER — Ambulatory Visit (INDEPENDENT_AMBULATORY_CARE_PROVIDER_SITE_OTHER): Payer: Self-pay | Admitting: Internal Medicine

## 2019-08-03 VITALS — BP 126/52 | HR 48 | Temp 98.1°F | Ht 76.0 in | Wt 215.8 lb

## 2019-08-03 DIAGNOSIS — R9431 Abnormal electrocardiogram [ECG] [EKG]: Secondary | ICD-10-CM | POA: Insufficient documentation

## 2019-08-03 DIAGNOSIS — R001 Bradycardia, unspecified: Secondary | ICD-10-CM | POA: Insufficient documentation

## 2019-08-03 DIAGNOSIS — F419 Anxiety disorder, unspecified: Secondary | ICD-10-CM

## 2019-08-03 DIAGNOSIS — I493 Ventricular premature depolarization: Secondary | ICD-10-CM

## 2019-08-03 DIAGNOSIS — F418 Other specified anxiety disorders: Secondary | ICD-10-CM

## 2019-08-03 DIAGNOSIS — F1021 Alcohol dependence, in remission: Secondary | ICD-10-CM

## 2019-08-03 DIAGNOSIS — F1190 Opioid use, unspecified, uncomplicated: Secondary | ICD-10-CM

## 2019-08-03 DIAGNOSIS — F1120 Opioid dependence, uncomplicated: Secondary | ICD-10-CM

## 2019-08-03 DIAGNOSIS — M792 Neuralgia and neuritis, unspecified: Secondary | ICD-10-CM

## 2019-08-03 DIAGNOSIS — Z7289 Other problems related to lifestyle: Secondary | ICD-10-CM

## 2019-08-03 MED ORDER — BUPRENORPHINE HCL-NALOXONE HCL 8-2 MG SL SUBL: SUBLINGUAL_TABLET | SUBLINGUAL | 1 refills | Status: DC

## 2019-08-03 MED ORDER — GABAPENTIN 100 MG PO CAPS: 300.0000 mg | ORAL_CAPSULE | Freq: Three times a day (TID) | ORAL | 5 refills | Status: DC

## 2019-08-03 MED FILL — BUPRENORPHIN-NALOXON 8-2 MG: 8-2 | 14 days supply | Qty: 35 | Fill #0

## 2019-08-03 NOTE — Addendum Note (Signed)
Addended by: Lalla Brothers T on: 08/03/2019 11:18 AM   Modules accepted: Orders

## 2019-08-03 NOTE — Assessment & Plan Note (Signed)
Previously prescribed citalopram although he did not start this as he felt like he didn't need it. He endorses some depression but states this is situational and due to his pain. He denies SI or feelings of wanting to hurt himself. He has difficulty sleeping at night, which is why he continues to drink. He drinks about 3-4 beers per night.

## 2019-08-03 NOTE — Patient Instructions (Addendum)
Thank you for allowing [place] to provide your care today.   We have refilled your prescription.   Please follow-up in four weeks to establish care with PCP and with OUD clinic.    Please increase your gabapentin to 300 mg three times per day - three pills three times per day.   Please call the internal medicine center clinic if you have any questions or concerns, we may be able to help and keep you from a long and expensive emergency room wait. Our clinic and after hours phone number is [phone], the best time to call is Monday through Friday 9 am to 4 pm but there is always someone available 24/7 if you have an emergency. If you need medication refills please notify your pharmacy one week in advance and they will send [place] a request.

## 2019-08-03 NOTE — Assessment & Plan Note (Signed)
He has a heart arrhythmia for which he sees Dr. Terrence Dupont and endorses dizziness sometimes just while standing or sitting. He was told that he would likely need a pacemaker at some point. He denies chest pain. He states his cardiologist is aware. He was previously on metop 50 mg tid but this was decreased to 25 mg tid with recent hospital admission. He has not fallen or had any syncopal episodes.   - follow-up with cardiology, request records - follow-up to establish care in clinic - baseline ecg today - ecg similar to priors with frequent PVCs, irregular with rate around 65 - continue metop 25 mg tid

## 2019-08-03 NOTE — Assessment & Plan Note (Signed)
He continues to have hip pain although he does feel that the suboxone helps somewhat. He takes 8 mg am and qhs and 4 mg in the afternoon. He also takes gabapentin 200 mg tid.  He is not having symptoms withdrawal or craving and does feel like the pain is tolerable although he'd like to continue improving it if possible. He has never obtained medication from others outside of the clinic setting and has not done so since last visit.   - continue suboxone 2.5 mg qd  - tox screen today  - follow-up 4 weeks  - increase gabapentin from 200 to 300 mg tid

## 2019-08-03 NOTE — Progress Notes (Signed)
   08/03/2019  Mark [name] presents for follow up of opioid use disorder I have reviewed the prior induction visit, follow up visits, and telephone encounters relevant to opiate use disorder (OUD) treatment.   Current daily dose: suboxone 8-2 mg 2.5 tabs total   Date of Induction: 05/21/19  Current follow up interval, in weeks: 4 weeks   The patient has been adherent with the buprenorphine for OUD contract.   Last UDS Result: 07/06/19 positive for buprenorphine and metabolite, +oxazepam and temazepam   HPI:   He continues to have hip pain although he does feel that the suboxone helps some what. He takes 8 mg am and qhs and 4 mg in the afternoon. He also takes gabapentin 200 mg tid.  He is not having symptoms withdrawal or craving and does feel like the pain is tolerable although he'd like to continue improving it if possible. He has never obtained medication from others outside of a clinic setting and has not done so since last visit. He takes tylenol on occasion but not when he is drinking.   He has a heart arrhythmia for which he sees Dr. Terrence Dupont and endorses dizziness sometimes just while standing or sitting. He was told that he would likely need a pacemaker at some point. He denies chest pain. He states his cardiologist is aware. He was previously on metop 50 mg tid but this was decreased to 25 mg tid with recent hospital admission. He has not fallen or had any syncopal episodes.   Previously prescribed citalopram although he did not start this as he felt like he didn't need it. He endorses some depression but states this is situational and due to his pain. He denies SI or feelings of wanting to hurt himself. He has difficulty sleeping at night, which is why he continues to drink. He drinks about 3-4 beers per night.   Exam:   Vitals:   08/03/19 0953  BP: (!) 126/52  Pulse: (!) 48  Temp: 98.1 F (36.7 C)  TempSrc: Oral  SpO2: 97%  Weight: 215 lb 12.8 oz (97.9 kg)  Height:  6\' 4"  (1.93 m)    Constitution: NAD, appears stated age  54: [place]/AT, no icterus or injection Cardio: bradycardic, irregularly irregular, no m/r/g  Respiratory: CTA, no w/r/r Neuro: a&o, normal affect Skin: c/d/i    Assessment/Plan:  See Problem Based Charting in the Encounters Tab     Leathia Farnell A, DO  08/03/2019  10:26 AM

## 2019-08-05 NOTE — Progress Notes (Signed)
Internal Medicine Clinic Attending  Case discussed with Dr. Seawell at the time of the visit.  We reviewed the resident's history and exam and pertinent patient test results.  I agree with the assessment, diagnosis, and plan of care documented in the resident's note.    

## 2019-08-06 LAB — TOXASSURE SELECT,+ANTIDEPR,UR

## 2019-08-09 ENCOUNTER — Other Ambulatory Visit: Payer: Self-pay | Admitting: Internal Medicine

## 2019-08-31 ENCOUNTER — Other Ambulatory Visit: Payer: Self-pay | Admitting: Student in an Organized Health Care Education/Training Program

## 2019-08-31 ENCOUNTER — Ambulatory Visit (INDEPENDENT_AMBULATORY_CARE_PROVIDER_SITE_OTHER): Payer: Self-pay | Admitting: Student in an Organized Health Care Education/Training Program

## 2019-08-31 ENCOUNTER — Other Ambulatory Visit: Payer: Self-pay

## 2019-08-31 VITALS — BP 125/64 | HR 66 | Temp 98.2°F | Wt 214.2 lb

## 2019-08-31 DIAGNOSIS — M792 Neuralgia and neuritis, unspecified: Secondary | ICD-10-CM

## 2019-08-31 DIAGNOSIS — F1199 Opioid use, unspecified with unspecified opioid-induced disorder: Secondary | ICD-10-CM

## 2019-08-31 DIAGNOSIS — F1120 Opioid dependence, uncomplicated: Secondary | ICD-10-CM

## 2019-08-31 DIAGNOSIS — F1021 Alcohol dependence, in remission: Secondary | ICD-10-CM

## 2019-08-31 DIAGNOSIS — F1190 Opioid use, unspecified, uncomplicated: Secondary | ICD-10-CM

## 2019-08-31 MED ORDER — GABAPENTIN 100 MG PO CAPS: 300.0000 mg | ORAL_CAPSULE | Freq: Three times a day (TID) | ORAL | 5 refills | Status: DC

## 2019-08-31 MED ORDER — BUPRENORPHINE HCL-NALOXONE HCL 8-2 MG SL SUBL: SUBLINGUAL_TABLET | SUBLINGUAL | 1 refills | Status: DC

## 2019-08-31 MED FILL — BUPRENORPHIN-NALOXON 8-2 MG: 8-2 | 14 days supply | Qty: 35 | Fill #0

## 2019-08-31 NOTE — Progress Notes (Signed)
Assessment and Plan:  See Encounters tab for problem-based medical decision making.   __________________________________________________________  HPI:   54 year old person here for medication-assisted treatment of opioid use disorder.  Doing pretty well at home, he was addicted to Percocets as a result of numerous chronic pain generators from musculoskeletal disease and arthritis.  He had no cravings for Percocets, and no withdrawal symptoms since starting Suboxone.  He does feel that his chronic pain is a little worse, but not yet function limiting.  He walks with a cane and says he still completes all his activities of daily living.  He has been treated with methadone in the past, but had a bad experience with escalating dose and then ultimately a ICU admission for withdrawal.  His acute complaint today of bilateral tender breasts for few weeks.  Also complains of some difficulty urinating, weak stream, and painful defecation.  __________________________________________________________  Problem List: Patient Active Problem List   Diagnosis Date Noted  . Opioid use disorder, moderate, dependence ([place]) 01/22/2014    Priority: High    Class: Acute  . Depression with anxiety 05/25/2019  . [place]   . Frequent PVCs   . Trigeminy 02/12/2019  . Muscle spasm of back 11/09/2018  . Elevated liver enzymes 11/09/2018  . Hyponatremia 08/15/2018  . Elevated LFTs 08/15/2018  . Neurogenic pain 08/10/2018  . Osteoarthritis of hip (Right) 04/07/2018  . Spondylosis without myelopathy or radiculopathy, lumbar region 03/23/2018  . Other specified dorsopathies, sacral and sacrococcygeal region 03/23/2018  . Chronic hip pain (Right) 03/23/2018  . Chronic sacroiliac joint pain (Left) 03/23/2018  . Pain management contract broken 12/15/2017  . Dyspepsia   . Opioid-induced hyperalgesia (with oxycodone) 02/11/2017  . Hyperalgesia (Lower extremities) 12/26/2016  . Sympathetic pain (lower extremity)  09/26/2016  . Lower extremity neuropathy (Primary Area of Pain) (Bilateral) (R>L) 09/26/2016  . Lumbar facet syndrome (Bilateral) (R>L) 08/22/2016  . Chronic sacroiliac joint pain (Bilateral) (R>L) 08/22/2016  . Chronic lower extremity pain (Bilateral) (R>L) 06/26/2016  . Opiate use 05/14/2016  . Occipital headache (Bilateral) (R>L) 04/16/2016  . Chronic musculoskeletal pain 04/15/2016  . 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 Long term current use of opiate analgesic 03/18/2016  . Long term prescription opiate use 03/18/2016  . Chronic neck pain (Bilateral)(R>L) 03/18/2016  . Cervical fusion syndrome 03/18/2016  . 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 Chronic knee pain  (Bilateral) (R>L) 03/18/2016  . GAD (generalized anxiety disorder) 12/06/2015  . Alcohol use disorder, severe, in early remission ([place]) 12/06/2015  . GERD (gastroesophageal reflux disease) 07/24/2015  . Chronic anxiety 12/19/2014  . Erectile dysfunction 09/16/2014  . Poor dentition 07/28/2014  . Chronic maxillary sinusitis 07/28/2014  . Vitamin D insufficiency 07/05/2014  . Chronic fatigue 07/04/2014  . Chronic radicular low back pain (Third area of Pain) (Right) (to calf) 06/02/2014  . Chronic shoulder pain (Right) 05/12/2014  . 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 Hypokalemia   . Paroxysmal VT ([place]) 12/19/2013  . Chronic low back pain (Secondary area of Pain) (Bilateral) (R>L) 12/28/2012  . Chronic pain syndrome 12/28/2012  . Diarrhea 12/28/2012  . History of alcohol abuse 12/28/2012  . 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 Nonsustained ventricular tachycardia ([place]) 01/20/2012  . Tobacco abuse 01/19/2012  . Essential hypertension   . Hyperlipidemia     Medications: Reconciled today in Epic __________________________________________________________  Physical Exam:  Vital Signs: Vitals:   08/31/19 0953 08/31/19 0956  BP:  125/64  Pulse:  66  Temp:  98.2 F  (36.8 C)  TempSrc:  Oral  SpO2:  97%  Weight: 214 lb 3.2 oz (97.2 kg)     Gen: Well appearing, NAD Chest: Mild bilateral gynecomastia, there is tender breast tissue immediately deep to both nipples CV: RRR, no murmurs Pulm: Normal effort, CTA throughout, no wheezing Abd: Soft, NT, ND,

## 2019-08-31 NOTE — Assessment & Plan Note (Signed)
Stable, well-controlled mild opioid use disorder.  No further use of oxycodone.  Plan to continue with Suboxone 2.5 tablets daily, I sent a 4-week supply to the [facility] outpatient pharmacy.  Patient does not have insurance coverage, receives this prescription for $25 for 2 weeks which is affordable.

## 2019-08-31 NOTE — Assessment & Plan Note (Signed)
Chronic and improved, but not yet stabilized.  Still drinking 2-3 standard size beers almost nightly.  Showing some stigmata of chronic liver disease today with early gynecomastia.  Labs a few months ago looked okay, normal platelets.  I think hearing this information was impactful for him, he is going to work to totally cut out alcohol.  Not a good candidate for oral naltrexone as he is on treatments for OUD with Suboxone.  Would check follow-up liver function later this year, potentially a fibrosis score, may benefit from specialty liver care if he continues to show stigmata of early cirrhosis.

## 2019-09-03 LAB — TOXASSURE SELECT,+ANTIDEPR,UR

## 2019-09-13 MED FILL — BUPRENORPHIN-NALOXON 8-2 MG: 8-2 | 14 days supply | Qty: 35 | Fill #1

## 2019-09-21 MED FILL — GABAPENTIN 100 MG CAP: 100 | 30 days supply | Qty: 270 | Fill #0

## 2019-09-27 MED FILL — BACLOFEN 10 MG TABS: 10 | 30 days supply | Qty: 90 | Fill #1

## 2019-09-28 ENCOUNTER — Encounter: Payer: Self-pay | Admitting: Internal Medicine

## 2019-09-28 ENCOUNTER — Other Ambulatory Visit: Payer: Self-pay

## 2019-09-28 ENCOUNTER — Ambulatory Visit (INDEPENDENT_AMBULATORY_CARE_PROVIDER_SITE_OTHER): Payer: Self-pay | Admitting: Internal Medicine

## 2019-09-28 VITALS — BP 143/86 | HR 60 | Temp 98.4°F | Ht 76.0 in | Wt 213.7 lb

## 2019-09-28 DIAGNOSIS — F1190 Opioid use, unspecified, uncomplicated: Secondary | ICD-10-CM

## 2019-09-28 DIAGNOSIS — R399 Unspecified symptoms and signs involving the genitourinary system: Secondary | ICD-10-CM

## 2019-09-28 DIAGNOSIS — F10988 Alcohol use, unspecified with other alcohol-induced disorder: Secondary | ICD-10-CM

## 2019-09-28 DIAGNOSIS — F1011 Alcohol abuse, in remission: Secondary | ICD-10-CM

## 2019-09-28 DIAGNOSIS — R7303 Prediabetes: Secondary | ICD-10-CM

## 2019-09-28 DIAGNOSIS — K219 Gastro-esophageal reflux disease without esophagitis: Secondary | ICD-10-CM

## 2019-09-28 DIAGNOSIS — F1021 Alcohol dependence, in remission: Secondary | ICD-10-CM

## 2019-09-28 DIAGNOSIS — F1120 Opioid dependence, uncomplicated: Secondary | ICD-10-CM

## 2019-09-28 DIAGNOSIS — I10 Essential (primary) hypertension: Secondary | ICD-10-CM

## 2019-09-28 DIAGNOSIS — F1020 Alcohol dependence, uncomplicated: Secondary | ICD-10-CM

## 2019-09-28 LAB — POC HEMOCCULT BLD/STL (OFFICE/1-CARD/DIAGNOSTIC)
Card #1 Date: 7202021
Fecal Occult Blood, POC: NEGATIVE

## 2019-09-28 MED ORDER — BUPRENORPHINE HCL-NALOXONE HCL 8-2 MG SL SUBL: SUBLINGUAL_TABLET | SUBLINGUAL | 1 refills | Status: DC

## 2019-09-28 MED ORDER — PANTOPRAZOLE SODIUM 20 MG PO TBEC
20.0000 mg | DELAYED_RELEASE_TABLET | Freq: Every day | ORAL | 1 refills | Status: DC
Start: 2019-09-28 — End: 2019-11-10

## 2019-09-28 MED ORDER — TAMSULOSIN HCL 0.4 MG PO CAPS: 0.4000 mg | ORAL_CAPSULE | Freq: Every day | ORAL | 0 refills | Status: DC

## 2019-09-28 MED FILL — TAMSULOSIN HCL 0.4 MG CAP: 0.4 | 30 days supply | Qty: 30 | Fill #0

## 2019-09-28 MED FILL — BUPRENORPHIN-NALOXON 8-2 MG: 8-2 | 14 days supply | Qty: 35 | Fill #0

## 2019-09-28 MED FILL — PANTOPRAZOLE SOD DR 20 MG T: 20 | 30 days supply | Qty: 30 | Fill #0

## 2019-09-28 NOTE — Patient Instructions (Signed)
Thank you, [name] for allowing [place] to provide your care today. Today we discussed Abdominal Pain.    I have ordered the following labs for you:   Lab Orders     CMP14 + Anion Gap     CBC no Diff     Vitamin B12     RBC Folate     PSA   I will call if any are abnormal. All of your labs can be accessed through "My Chart".   I have ordered the following medication/changed the following medications:  1. begin Protonix 40 mg daily 2. begin Flomax 0.4 mg daily  Please follow-up in in 1 weeks.(Friday with Dr. Marianna [name])  Should you have any questions or concerns please call the internal medicine clinic at [phone].    Mark [name], D.O. [facility]   My Chart Access: [email or web address]   If you have not already done so, please get your COVID 19 vaccine  To schedule an appointment for a COVID vaccine choice any of the following: Go to [email or web address]   Go to [email or web address]                  Call [phone]                                     Call [phone] and select Option 2

## 2019-09-28 NOTE — Progress Notes (Signed)
CC: GERD  HPI:  [name] is a 54 y.o. male with a past medical history stated below and presents today for GERD. Please see problem based assessment and plan for additional details.  Past Medical History:  Diagnosis Date  . Alcohol withdrawal ([place])   . Allergy   . Anxiety Dx 2014  . Chronic low back pain (Secondary area of Pain) (Bilateral) (R>L) 12/28/2012  . COPD (chronic obstructive pulmonary disease) ([place])   . Drug abuse ([place])    pt reports opioid dependence due to previous back surgeries  . Enlarged prostate   . ETOH abuse   . GERD (gastroesophageal reflux disease) Dx 2003  . Headache(784.0)   . Hepatitis   . Hyperlipidemia Dx 2000  . Hypertension Dx 2011  . Hypokalemia   . IBS (irritable bowel syndrome)    alternates constiaption/ diarrhea  . Irregular heart beat   . 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 Mental disorder   . Neuromuscular disorder ([place])    hands hurt   . Neuropathy 06/07/2013  . Seizure due to alcohol withdrawal ([place]) 01/23/2014   Per patient report  . Shortness of breath   . Withdrawal seizures ([place]) 2014   last seizure 2014 etoh with drawal    Current Outpatient Medications on File Prior to Visit  Medication Sig Dispense Refill  . albuterol (VENTOLIN HFA) 108 (90 Base) MCG/ACT inhaler Inhale 2 puffs into the lungs every 6 (six) hours as needed for wheezing or shortness of breath. 18 g 3  . Cholecalciferol (VITAMIN D3 PO) Take 1 capsule by mouth daily.     . famotidine (PEPCID AC) 10 MG chewable tablet Chew 10 mg by mouth 2 (two) times daily.     Marland Kitchen gabapentin (NEURONTIN) 100 MG capsule Take 3 capsules (300 mg total) by mouth 3 (three) times daily. 270 capsule 5  . Magnesium Oxide 500 MG CAPS Take 1 capsule (500 mg total) by mouth 2 (two) times daily at 8 am and 10 pm. (Patient taking differently: Take 1 capsule by mouth daily. ) 60 capsule 11  . metoprolol tartrate (LOPRESSOR) 25 MG tablet Take 1 tablet (25 mg total) by mouth every 8  (eight) hours. 90 tablet 0  . naproxen sodium (ALEVE) 220 MG tablet Take 220-440 mg by mouth 2 (two) times daily as needed (for pain).     No current facility-administered medications on file prior to visit.    Family History  Problem Relation Age of Onset  . Lung cancer Father        was a smoker  . Alcohol abuse Father   . Hypertension Mother   . Colon polyps Mother   . Breast cancer Paternal Grandmother   . Alcohol abuse Paternal Grandfather   . Colon cancer Neg Hx   . Rectal cancer Neg Hx   . Stomach cancer Neg Hx     Social History   Socioeconomic History  . Marital status: Divorced    Spouse name: Not on file  . Number of children: 2  . Years of education: Not on file  . Highest education level: Not on file  Occupational History  . Occupation: Unemployed  Tobacco Use  . Smoking status: Current Every Day Smoker    Packs/day: 0.50    Years: 30.00    Pack years: 15.00    Types: Cigarettes  . Smokeless tobacco: Never Used  . Tobacco comment: .5 PPD- 1ppd   Vaping Use  . Vaping Use: Never  used  Substance and Sexual Activity  . Alcohol use: Yes    Alcohol/week: 56.0 standard drinks    Types: 56 Cans of beer per week    Comment: Case a beer a day   . Drug use: No    Comment: Opana  . Sexual activity: Not Currently  Other Topics Concern  . Not on file  Social History Narrative   Pt lives with mother.  Not unemployed due to chronic pain, but does not receive disability.  Not followed by an outpatient provider.   Social Determinants of Health   Financial Resource Strain:   . Difficulty of Paying Living Expenses:   Food Insecurity:   . Worried About Charity fundraiser in the Last Year:   . Arboriculturist in the Last Year:   Transportation Needs:   . Film/video editor (Medical):   Marland Kitchen Lack of Transportation (Non-Medical):   Physical Activity:   . Days of Exercise per Week:   . Minutes of Exercise per Session:   Stress:   . Feeling of Stress :     Social Connections:   . Frequency of Communication with Friends and Family:   . Frequency of Social Gatherings with Friends and Family:   . Attends Religious Services:   . Active Member of Clubs or Organizations:   . Attends Archivist Meetings:   Marland Kitchen Marital Status:   Intimate Partner Violence:   . Fear of Current or Ex-Partner:   . Emotionally Abused:   Marland Kitchen Physically Abused:   . Sexually Abused:     Review of Systems: ROS negative except for what is noted on the assessment and plan.  Vitals:   09/28/19 0936  BP: (!) 143/86  Pulse: 60  Temp: 98.4 F (36.9 C)  TempSrc: Oral  SpO2: 100%  Weight: 213 lb 11.2 oz (96.9 kg)  Height: 6\' 4"  (1.93 m)     Physical Exam: Physical Exam Exam conducted with a chaperone present.  Constitutional:      Appearance: He is well-developed.  HENT:     Head: Normocephalic and atraumatic.  Eyes:     General: No scleral icterus.    Extraocular Movements: Extraocular movements intact.     Conjunctiva/sclera: Conjunctivae normal.  Cardiovascular:     Rate and Rhythm: Normal rate.     Pulses: Normal pulses.     Heart sounds: No murmur heard.  No friction rub. No gallop.   Pulmonary:     Effort: Pulmonary effort is normal.     Breath sounds: Normal breath sounds.  Abdominal:     General: There is distension.     Palpations: Abdomen is soft. There is no shifting dullness, fluid wave or mass.     Tenderness: There is abdominal tenderness (epigastric). There is no right CVA tenderness, left CVA tenderness, guarding or rebound.  Genitourinary:    Prostate: Enlarged. Not tender and no nodules present.     Rectum: Guaiac result negative. Tenderness present. No mass, anal fissure, external hemorrhoid or internal hemorrhoid. Normal anal tone.  Musculoskeletal:        General: Normal range of motion.     Cervical back: Normal range of motion.     Right lower leg: No edema.  Skin:    General: Skin is warm and dry.     Coloration:  Skin is not jaundiced.     Findings: No erythema (palmar), lesion (angiomas) or rash.  Neurological:     General: No  focal deficit present.     Mental Status: He is alert and oriented to person, place, and time. Mental status is at baseline.     Cranial Nerves: No dysarthria.     Sensory: Sensory deficit (Bilateral lower extremity sensory defecits, 3+ pulses) present.     Motor: No weakness, tremor or abnormal muscle tone.     Coordination: Coordination is intact.     Gait: Gait is intact.  Psychiatric:        Attention and Perception: Attention normal.        Mood and Affect: Mood normal.        Speech: Speech normal.        Behavior: Behavior normal.        Thought Content: Thought content normal.        Cognition and Memory: Cognition normal.        Judgment: Judgment normal.      Assessment & Plan:   See Encounters Tab for problem based charting.  Patient discussed with Dr. Hilbert Odor, D.O. [facility], PGY-2 Pager: [phone], Phone: [phone] Date 09/29/2019 Time 6:46 AM

## 2019-09-29 ENCOUNTER — Encounter: Payer: Self-pay | Admitting: Internal Medicine

## 2019-09-29 DIAGNOSIS — R7303 Prediabetes: Secondary | ICD-10-CM | POA: Insufficient documentation

## 2019-09-29 DIAGNOSIS — N400 Enlarged prostate without lower urinary tract symptoms: Secondary | ICD-10-CM | POA: Insufficient documentation

## 2019-09-29 LAB — CBC
Hematocrit: 51.4 % — ABNORMAL HIGH (ref 37.5–51.0)
Hemoglobin: 17.8 g/dL — ABNORMAL HIGH (ref 13.0–17.7)
MCH: 27 pg (ref 26.6–33.0)
MCHC: 34.6 g/dL (ref 31.5–35.7)
MCV: 78 fL — ABNORMAL LOW (ref 79–97)
Platelets: 353 10*3/uL (ref 150–450)
RBC: 6.59 x10E6/uL — ABNORMAL HIGH (ref 4.14–5.80)
RDW: 17 % — ABNORMAL HIGH (ref 11.6–15.4)
WBC: 9.3 10*3/uL (ref 3.4–10.8)

## 2019-09-29 LAB — CMP14 + ANION GAP
ALT: 22 IU/L (ref 0–44)
AST: 20 IU/L (ref 0–40)
Albumin/Globulin Ratio: 1.7 (ref 1.2–2.2)
Albumin: 4.3 g/dL (ref 3.8–4.9)
Alkaline Phosphatase: 157 IU/L — ABNORMAL HIGH (ref 48–121)
Anion Gap: 19 mmol/L — ABNORMAL HIGH (ref 10.0–18.0)
BUN/Creatinine Ratio: 10 (ref 9–20)
BUN: 6 mg/dL (ref 6–24)
Bilirubin Total: 0.4 mg/dL (ref 0.0–1.2)
CO2: 23 mmol/L (ref 20–29)
Calcium: 9.6 mg/dL (ref 8.7–10.2)
Chloride: 94 mmol/L — ABNORMAL LOW (ref 96–106)
Creatinine, Ser: 0.6 mg/dL — ABNORMAL LOW (ref 0.76–1.27)
GFR calc Af Amer: 132 mL/min/{1.73_m2} (ref >59)
GFR calc non Af Amer: 114 mL/min/{1.73_m2} (ref >59)
Globulin, Total: 2.5 g/dL (ref 1.5–4.5)
Glucose: 75 mg/dL (ref 65–99)
Potassium: 4.4 mmol/L (ref 3.5–5.2)
Sodium: 136 mmol/L (ref 134–144)
Total Protein: 6.8 g/dL (ref 6.0–8.5)

## 2019-09-29 LAB — FOLATE RBC
Folate, Hemolysate: 392 ng/mL
Folate, RBC: 735 ng/mL (ref >498)
Hematocrit: 53.3 % — ABNORMAL HIGH (ref 37.5–51.0)

## 2019-09-29 LAB — PSA: Prostate Specific Ag, Serum: 2.1 ng/mL (ref 0.0–4.0)

## 2019-09-29 LAB — VITAMIN B12: Vitamin B-12: 474 pg/mL (ref 232–1245)

## 2019-09-29 NOTE — Assessment & Plan Note (Signed)
Patient has a hemoglobin A1c of 6.4 concerning for prediabetes.  Patient will need close monitoring in the future for diabetes medication management.

## 2019-09-29 NOTE — Assessment & Plan Note (Addendum)
Patient has a significant history of alcohol use disorder with previous history of alcohol withdrawal seizures.  Patient states that his alcohol use has substantially improved since then admitting to only drinking 5-6 beers 3-4 times a week.  Patient states that the drinking helps his abdominal pain, and he admits to feeling anxious with diaphoresis when he does not drink.  This is concerning for an increased risk for alcohol withdrawal.  Considering the patient's significant medical history I was unable to fully address his alcohol use disorder and plan for alcohol cessation during this visit.  Therefore I asked the patient to return in 1 week to reevaluate his symptoms and create a game plan for alcohol cessation.  Regardless, I counseled the patient regarding the signs and symptoms of alcohol withdrawal including nausea, tremors, diaphoresis, anxiety, psychomotor agitation, tactile, auditory or visual hallucinations, and headache. I told him to seeking medical care immediately if he experiences the symptoms.  Plan:  - 1 week follow up  - Order preliminary labs including CBC, CMP, B12, and folate. - Will need to perform a thorough PE looking for s/sx of ETOH neurologic side effects at f/u appointment.

## 2019-09-29 NOTE — Assessment & Plan Note (Addendum)
Patient is admits to subacute to chronic lower urinary tract symptoms including frequent urination/frequent nocturia, incomplete emptying, and post micturition dribbling.  Patient denies any dysuria or hematuria.  He does not have any CVA tenderness, fever or systemic signs of infection.  DRE today was significant for symmetrically enlarged prostate without pain or focal nodularity.  Patient symptoms are likely secondary to benign prostatic hyperplasia but will assess PSA today to stratify need for further cancer surveillance.  Plan: - Begin Tamsulosin 0.4 mg daily   - PSA ordered today

## 2019-09-29 NOTE — Assessment & Plan Note (Addendum)
Patient presents today for follow-up for opioid use disorder.  Patient states that he is taking Suboxone 8-2 mg, 2.5 tablets daily.  Patient states that his opioid cravings are controlled but continues to have significant chronic pain.  Patient denies relapse on the medication but continues to suffer from alcohol use disorder and associated complications.  PDMP reviewed today. Last Utox was performed on 08/31/19 and was appropriate.  Patient will need to follow-up in 2 weeks in person for U tox and medication refill.  Plan: - Refill Suboxone 8-2 mg today.  - Follow up in 2 weeks

## 2019-09-29 NOTE — Assessment & Plan Note (Addendum)
Patient presents today for further evaluation and management of his epigastric abdominal pain.  The pain is subacute to chronic and likely secondary to his ongoing alcohol use disorder.  Patient states that he experiences the pain daily in the epigastric region and radiates upward causing a burning pain with dyspepsia and reflux.  He states the pain is worse at night particularly when he lays down causing significant nausea and discomfort that makes it difficult to sleep at night.  Patient states that he has had significant dysphagia in the past requiring Nissen's fundoplication surgery and therefore will dry heave but be unable to vomit.  Patient states that he dry heaves multiple times a week without production of emesis or blood.  Patient states that he takes Zantac as he believes this has provided more relief than Pepcid in the past, but continues to have significant symptoms.  Furthermore the patient has taken Pepto-Bismol daily for the symptoms without improvement.  Patient does admit to dark stools and believes is secondary to the Pepto use.  Patient states that his alcohol use has substantially improved since previous.  He admits to drinking upwards of 5-6 beers 3-4 times a week.   He denies fever, chills, hematochezia/melena, constipation.  Patient's pain does seem to be fairly severe and appears to worsen with food intake.  On evaluation the patients abdomen was soft but mildly distended with tenderness to palpation over the epigastric region.  Patient did not exhibit any guarding or appreciable masses.  No stigmata of liver disease noted on the patient today.  Rectal exam was significant for dark stool that was ultimately Hemoccult negative.  I have concern that the patient has worsening gastroesophageal reflux versus possible peptic ulcer.  There are no overt signs or symptoms of bleeding at this time and recent CT imaging in March does not show any liver changes to suspect cirrhosis and therefore  elevated portal pressures concerning for esophageal varices.    I counseled the patient on the importance of alcohol cessation and the signs and symptoms of alcohol withdrawal. I discussed medication management including proton pump inhibitors in combination with H2 blockers in the short-term for acid suppressing therapy.  I will also get a CBC to assess for changes in hemoglobin concerning for iron deficiency anemia.  Plan to follow-up in 1 week to further evaluate his alcohol use disorder.  Patient admits to understanding and agrees to the plan.    Plan: -Protonix 40 mg daily combined with Zantac 150 mg to 300 mg daily.  Counseled him that this should only be used in the short-term while PPI takes effect. -Counseled patient on alcohol cessation. -Performed rectal exam today with no evidence of hemorrhoids, or bloody/melanotic stool.

## 2019-09-29 NOTE — Assessment & Plan Note (Signed)
Patient presents today for further evaluation and management of his HTN. His blood pressure is uncontrolled on Losartan (not taking) and metoprolol 25 mg. He admits to being non-compliant with his antihypertensive medications. He deniesorthostatic lightheadedness, palpitations, swelling in ankles and other medicaiton side effects.  Current Blood pressure for today's visit is listed below:  Vitals:   09/28/19 0936  BP: (!) 143/86  Pulse: 60 Comment: 91 -30's  Temp: 98.4 F (36.9 C)  Height: 6\' 4"  (1.93 m)  Weight: 213 lb 11.2 oz (96.9 kg)  SpO2: 100% Comment: room air  TempSrc: Oral  BMI (Calculated): 26.02     Plan: 1. I will get a repeat CMP to assess his renal and liver function and bring him back in 1 week to titrate his antihypertensive medications.

## 2019-09-29 NOTE — Progress Notes (Signed)
Internal Medicine Clinic Attending  Case discussed with Dr. [name]  At the time of the visit.  We reviewed the resident's history and exam and pertinent patient test results.  I agree with the assessment, diagnosis, and plan of care documented in the resident's well documented note.

## 2019-10-01 ENCOUNTER — Ambulatory Visit (INDEPENDENT_AMBULATORY_CARE_PROVIDER_SITE_OTHER): Payer: Self-pay | Admitting: Internal Medicine

## 2019-10-01 ENCOUNTER — Other Ambulatory Visit (HOSPITAL_COMMUNITY)
Admission: RE | Admit: 2019-10-01 | Discharge: 2019-10-01 | Disposition: A | Discharge status: Home / Self Care | Payer: Self-pay | Source: Ambulatory Visit | Attending: Internal Medicine | Admitting: Internal Medicine

## 2019-10-01 ENCOUNTER — Other Ambulatory Visit: Payer: Self-pay

## 2019-10-01 ENCOUNTER — Other Ambulatory Visit: Payer: Self-pay | Admitting: *Deleted

## 2019-10-01 ENCOUNTER — Encounter: Payer: Self-pay | Admitting: Internal Medicine

## 2019-10-01 VITALS — BP 138/70 | HR 43 | Temp 98.2°F | Ht 76.0 in | Wt 215.6 lb

## 2019-10-01 DIAGNOSIS — R7303 Prediabetes: Secondary | ICD-10-CM

## 2019-10-01 DIAGNOSIS — E785 Hyperlipidemia, unspecified: Secondary | ICD-10-CM

## 2019-10-01 DIAGNOSIS — R300 Dysuria: Secondary | ICD-10-CM | POA: Insufficient documentation

## 2019-10-01 DIAGNOSIS — I10 Essential (primary) hypertension: Secondary | ICD-10-CM

## 2019-10-01 DIAGNOSIS — K219 Gastro-esophageal reflux disease without esophagitis: Secondary | ICD-10-CM

## 2019-10-01 DIAGNOSIS — E782 Mixed hyperlipidemia: Secondary | ICD-10-CM

## 2019-10-01 DIAGNOSIS — F1020 Alcohol dependence, uncomplicated: Secondary | ICD-10-CM

## 2019-10-01 DIAGNOSIS — D751 Secondary polycythemia: Secondary | ICD-10-CM

## 2019-10-01 DIAGNOSIS — R399 Unspecified symptoms and signs involving the genitourinary system: Secondary | ICD-10-CM

## 2019-10-01 LAB — GLUCOSE, CAPILLARY: Glucose-Capillary: 138 mg/dL — ABNORMAL HIGH (ref 70–99)

## 2019-10-01 LAB — POCT GLYCOSYLATED HEMOGLOBIN (HGB A1C): Hemoglobin A1C: 6.2 % — AB (ref 4.0–5.6)

## 2019-10-01 MED ORDER — ROSUVASTATIN CALCIUM 20 MG PO TABS: 20.0000 mg | ORAL_TABLET | Freq: Every day | ORAL | 3 refills | Status: DC

## 2019-10-01 MED ORDER — METFORMIN HCL 500 MG PO TABS: 500.0000 mg | ORAL_TABLET | Freq: Every day | ORAL | 11 refills | Status: DC

## 2019-10-01 MED FILL — METFORMIN HCL 500 MG TABS: 500 | 30 days supply | Qty: 30 | Fill #0

## 2019-10-01 MED FILL — ROSUVASTATIN CALCIUM 20 MG: 20 | 30 days supply | Qty: 30 | Fill #0

## 2019-10-01 NOTE — Telephone Encounter (Signed)
Pt called / stated he saw Dr [name] this am and 2 new meds would be sent to [facility]. According to ov, Metformin and Rosuvastatin were prescribed . Please send rxs   Thanks

## 2019-10-01 NOTE — Assessment & Plan Note (Addendum)
Patient presents with a significant history of hyperlipidemia shown below.  He has an ASCVD risk of 18.2%.  Patient's significant risk factors for cardiovascular disease include hyperlipidemia, prediabetes, smoking, hypertension on treatment.  Cholesterol, Total 100 - 199 mg/dL 212High   Triglycerides 0 - 149 mg/dL 211High   HDL >39 mg/dL 35Low   VLDL Cholesterol Cal 5 - 40 mg/dL 42High   LDL Calculated 0 - 99 mg/dL 135High   Chol/HDL Ratio 0.0 - 5.0 ratio 6.1High    Plan: 1.  Patient would likely benefit from starting high intensity statin.  I will start him on rosuvastatin 20 mg.  Kidney and renal function were recently tested are within normal limits. 2.  Monitor symptoms moving forward as the patient does have a history of vitamin D insufficiency.  This may need to be rechecked in the future particularly if he is having signs and symptoms of myalgias. 3.  Repeat lipid panel today, then again in the near future to assess for percent drop in his LDL.  Goal LDL will be less than 100.

## 2019-10-01 NOTE — Telephone Encounter (Signed)
Rxs have been sen to to pharmacy. Pt called to informed but no answer; left message per pt's earlier request.

## 2019-10-01 NOTE — Progress Notes (Signed)
CC: HTN  HPI:  [name] is a 54 y.o. male with a past medical history stated below and presents today for HTN. Please see problem based assessment and plan for additional details.  Past Medical History:  Diagnosis Date  . Alcohol withdrawal ([place])   . Allergy   . Anxiety Dx 2014  . Chronic low back pain (Secondary area of Pain) (Bilateral) (R>L) 12/28/2012  . COPD (chronic obstructive pulmonary disease) ([place])   . Drug abuse ([place])    pt reports opioid dependence due to previous back surgeries  . Dyspepsia   . Elevated LFTs 08/15/2018  . Enlarged prostate   . ETOH abuse   . GERD (gastroesophageal reflux disease) Dx 2003  . Headache(784.0)   . Hepatitis   . Hyperlipidemia Dx 2000  . Hypertension Dx 2011  . Hypokalemia   . Hyponatremia 08/15/2018  . IBS (irritable bowel syndrome)    alternates constiaption/ diarrhea  . Irregular heart beat   . 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 Mental disorder   . Neuromuscular disorder ([place])    hands hurt   . Neuropathy 06/07/2013  . Opiate use 05/14/2016  . Pain management contract broken 12/15/2017   Pain medication contract broken on 12/15/2017 and again around 05/17/2019.  The patient continues to be noncompliant with the medication rules and regulations.  He also continues to consume alcohol and to use more medication than prescribed.   . Seizure due to alcohol withdrawal ([place]) 01/23/2014   Per patient report  . Shortness of breath   . Withdrawal seizures ([place]) 2014   last seizure 2014 etoh with drawal    Current Outpatient Medications on File Prior to Visit  Medication Sig Dispense Refill  . albuterol (VENTOLIN HFA) 108 (90 Base) MCG/ACT inhaler Inhale 2 puffs into the lungs every 6 (six) hours as needed for wheezing or shortness of breath. 18 g 3  . buprenorphine-naloxone (SUBOXONE) 8-2 mg SUBL SL tablet Please take 2 and 1/2 tablet daily 35 tablet 1  . Cholecalciferol (VITAMIN D3 PO) Take 1 capsule by mouth daily.     Marland Kitchen  gabapentin (NEURONTIN) 100 MG capsule Take 3 capsules (300 mg total) by mouth 3 (three) times daily. 270 capsule 5  . Magnesium Oxide 500 MG CAPS Take 1 capsule (500 mg total) by mouth 2 (two) times daily at 8 am and 10 pm. (Patient taking differently: Take 1 capsule by mouth daily. ) 60 capsule 11  . metoprolol tartrate (LOPRESSOR) 25 MG tablet Take 1 tablet (25 mg total) by mouth every 8 (eight) hours. 90 tablet 0  . pantoprazole (PROTONIX) 20 MG tablet Take 1 tablet (20 mg total) by mouth daily. 30 tablet 1  . tamsulosin (FLOMAX) 0.4 MG CAPS capsule Take 1 capsule (0.4 mg total) by mouth daily. 30 capsule 0   No current facility-administered medications on file prior to visit.    Family History  Problem Relation Age of Onset  . Lung cancer Father        was a smoker  . Alcohol abuse Father   . Hypertension Mother   . Colon polyps Mother   . Breast cancer Paternal Grandmother   . Alcohol abuse Paternal Grandfather   . Colon cancer Neg Hx   . Rectal cancer Neg Hx   . Stomach cancer Neg Hx     Social History   Socioeconomic History  . Marital status: Divorced    Spouse name: Not on file  . Number of children:  2  . Years of education: Not on file  . Highest education level: Not on file  Occupational History  . Occupation: Unemployed  Tobacco Use  . Smoking status: Current Every Day Smoker    Packs/day: 0.50    Years: 30.00    Pack years: 15.00    Types: Cigarettes  . Smokeless tobacco: Never Used  . Tobacco comment: .5 PPD- 1ppd   Vaping Use  . Vaping Use: Never used  Substance and Sexual Activity  . Alcohol use: Yes    Alcohol/week: 56.0 standard drinks    Types: 56 Cans of beer per week    Comment: Case a beer a day   . Drug use: No    Comment: Opana  . Sexual activity: Not Currently  Other Topics Concern  . Not on file  Social History Narrative   Pt lives with mother.  Not unemployed due to chronic pain, but does not receive disability.  Not followed by an  outpatient provider.   Social Determinants of Health   Financial Resource Strain:   . Difficulty of Paying Living Expenses:   Food Insecurity:   . Worried About Charity fundraiser in the Last Year:   . Arboriculturist in the Last Year:   Transportation Needs:   . Film/video editor (Medical):   Marland Kitchen Lack of Transportation (Non-Medical):   Physical Activity:   . Days of Exercise per Week:   . Minutes of Exercise per Session:   Stress:   . Feeling of Stress :   Social Connections:   . Frequency of Communication with Friends and Family:   . Frequency of Social Gatherings with Friends and Family:   . Attends Religious Services:   . Active Member of Clubs or Organizations:   . Attends Archivist Meetings:   Marland Kitchen Marital Status:   Intimate Partner Violence:   . Fear of Current or Ex-Partner:   . Emotionally Abused:   Marland Kitchen Physically Abused:   . Sexually Abused:     Review of Systems: ROS negative except for what is noted on the assessment and plan.  Vitals:   10/01/19 1017  BP: (!) 138/70  Pulse: (!) 43  Temp: 98.2 F (36.8 C)  TempSrc: Oral  SpO2: 98%  Weight: (!) 215 lb 9.6 oz (97.8 kg)  Height: 6\' 4"  (1.93 m)     Physical Exam: Physical Exam Constitutional:      Appearance: Normal appearance.  HENT:     Head: Normocephalic and atraumatic.  Eyes:     Extraocular Movements: Extraocular movements intact.  Cardiovascular:     Rate and Rhythm: Normal rate.     Pulses: Normal pulses.     Heart sounds: Normal heart sounds.  Pulmonary:     Effort: Pulmonary effort is normal.     Breath sounds: Normal breath sounds.  Abdominal:     General: Bowel sounds are normal.     Palpations: Abdomen is soft.     Tenderness: There is no abdominal tenderness.  Musculoskeletal:        General: Normal range of motion.     Cervical back: Normal range of motion.     Right lower leg: No edema.     Left lower leg: No edema.  Skin:    General: Skin is warm and dry.    Neurological:     Mental Status: He is alert and oriented to person, place, and time. Mental status is at baseline.  Psychiatric:        Mood and Affect: Mood normal.      Assessment & Plan:   See Encounters Tab for problem based charting.  Patient discussed with Dr. Elyn Peers, D.O. [facility], PGY-2 Pager: [phone], Phone: [phone] Date 10/01/2019 Time 10:31 AM

## 2019-10-01 NOTE — Assessment & Plan Note (Addendum)
Patient states that he is taking Metoprolol 25 mg TID. He states that he has been compliant with his medication but it appears that he was last given a 30 day supply in March. Regardless, on this medication he admits to s/sx of bradycardia admits to dizziness, fatigue and diaphoresis. Patient was originally placed on this medication for a history of VT and PVC. He pulse today is in the 40's.  I counseled him on discontinue this medication and the need for close follow up with cardiology as he will likely need a Holter monitor.   Patient also states that he is taking Losartan, but there is no record of this medication recently being prescribed.  Furthermore, patient does have losartan listed on allergy list as severe allergy/anaphylaxis.  Therefore I told him to discontinue his medication immediately.  I instructed the patient to make a 1 week follow up without clinic.  At his next appointment we will need to reassess his blood pressure and restart a medication as needed.  Furthermore if his pulse is still low despite coming off of the metoprolol then we will likely need to get an EKG.  Plan: -Discontinue metoprolol today -Instructed patient to make a close follow-up appointment with cardiology -Follow-up in 1 week with our clinic to reassess blood pressure and pulse.

## 2019-10-01 NOTE — Assessment & Plan Note (Addendum)
Patient presents for follow-up for lower urinary tract symptoms.  Patient states that he has less lower urinary tract symptoms including nocturia, incomplete voiding.  But he has noticed that he started to have dysuria that feels like a knife at the end of his urethra with pain that radiates to his prostate.  He states that this type of pain has come and gone for several years.  Patient denies any systemic symptoms of infection.  He does not have any CVA tenderness, hematuria, or abdominal pain.   Patient's PSA at his last visit was within normal limits and showed no signs of further investigation for prostate cancer.  Plan: 1.  Perform urinalysis today with culture 2.  Perform STI screening for gonorrhea and chlamydia 3.  Continue tamsulosin 0.4 mg daily.   Dysuria - better part of a year off and on particularly when initiating Hesitate and stop and start again.  Sharp pain in the ureathra No lumps or bumps  no blood in urine

## 2019-10-01 NOTE — Assessment & Plan Note (Signed)
Duration of disorder:  When was last drink: How much per day, per week: Withdrawal seizure: yes Number of withdrawal episodes:

## 2019-10-01 NOTE — Patient Instructions (Signed)
Thank you, [name] for allowing [place] to provide your care today. Today we discussed blood pressure, cholesterol, prediabetes.    I have ordered the following labs for you:   Lab Orders     Culture, Urine     Lipid Profile     Urinalysis, Complete (81001)     Erythropoietin     POC Hbg A1C   I will call if any are abnormal. All of your labs can be accessed through "My Chart".  I have place a referrals to Cardiology for irregular heart beat..    I have ordered the following medication/changed the following medications:  1. discontinue Metoprolol 2. begin Rosuvastatin 20 mg daily 3. begin Metformin 500 mg daily  Please follow-up in in 1 weeks.  Should you have any questions or concerns please call the internal medicine clinic at [phone].    [name], D.O. [facility]   My Chart Access: [email or web address]   If you have not already done so, please get your COVID 19 vaccine  To schedule an appointment for a COVID vaccine choice any of the following: Go to [email or web address]   Go to [email or web address]                  Call [phone]                                     Call [phone] and select Option 2

## 2019-10-02 ENCOUNTER — Encounter: Payer: Self-pay | Admitting: Internal Medicine

## 2019-10-02 DIAGNOSIS — D751 Secondary polycythemia: Secondary | ICD-10-CM | POA: Insufficient documentation

## 2019-10-02 LAB — MICROSCOPIC EXAMINATION
Bacteria, UA: NONE SEEN
Casts: NONE SEEN /lpf
Epithelial Cells (non renal): NONE SEEN /hpf (ref 0–10)
RBC, Urine: NONE SEEN /hpf (ref 0–2)
WBC, UA: NONE SEEN /hpf (ref 0–5)

## 2019-10-02 LAB — URINALYSIS, COMPLETE
Bilirubin, UA: NEGATIVE
Glucose, UA: NEGATIVE
Ketones, UA: NEGATIVE
Leukocytes,UA: NEGATIVE
Nitrite, UA: NEGATIVE
Protein,UA: NEGATIVE
RBC, UA: NEGATIVE
Specific Gravity, UA: 1.006 (ref 1.005–1.030)
Urobilinogen, Ur: 0.2 mg/dL (ref 0.2–1.0)
pH, UA: 6 (ref 5.0–7.5)

## 2019-10-02 NOTE — Assessment & Plan Note (Signed)
Patient states that his GERD symptoms have improved since starting Protonix.  Patient states that he is also using Zantac.  Patient states that he still has some symptoms of reflux and dyspepsia but collectively his symptoms are improving and allowing him to sleep at night.  Patient also expressed interest in possibly getting tested for H. pylori in the near future.  I counseled him on the importance of discontinuing alcohol at this that is likely the primary reason for his gastritis/GERD.  Patient admits understanding.  Plan: -Continue PPI and H2 blocker and monitor symptoms

## 2019-10-02 NOTE — Assessment & Plan Note (Signed)
Patient has a history of prediabetes.  With a previous hemoglobin A1c of 6.4%.  Believe patient would probably benefit from medication management to decrease his risk of transitioning to type 2 diabetes.  I counseled him on starting Metformin for prediabetes and he agreed.  Plan: 1.  Repeat hemoglobin A1c today 2.  Start Metformin 500 mg daily

## 2019-10-02 NOTE — Assessment & Plan Note (Addendum)
Patient has a history of intermittent polycythemia with hemoglobin levels as high as 20.  Patient has a complex past medical history is difficult to determine if he is symptomatic at this time from elevated hemoglobin level.  I counseled him on the risk of polycythemia given his past lab results.  Patient currently does not have insurance and is currently working to get financial assistance through our clinic.  I explained that this is likely an expensive test and likely related to his smoking, despite SPO2 levels being at 98% today.  Patient states that he would like to start the testing for polycythemia at this time. I will order an EPO today.  Depending on the results of this test patient may still need Jak2 versus carboxyhemoglobin testing at follow-up.  Plan: 1.  Ordered EPO level today. 2.  Follow-up in 1 week.

## 2019-10-03 LAB — URINE CULTURE

## 2019-10-04 ENCOUNTER — Other Ambulatory Visit: Payer: Self-pay

## 2019-10-04 DIAGNOSIS — K219 Gastro-esophageal reflux disease without esophagitis: Secondary | ICD-10-CM

## 2019-10-04 DIAGNOSIS — F1011 Alcohol abuse, in remission: Secondary | ICD-10-CM

## 2019-10-04 LAB — LIPID PANEL
Chol/HDL Ratio: 5.3 ratio — ABNORMAL HIGH (ref 0.0–5.0)
Cholesterol, Total: 202 mg/dL — ABNORMAL HIGH (ref 100–199)
HDL: 38 mg/dL — ABNORMAL LOW (ref >39)
LDL Chol Calc (NIH): 133 mg/dL — ABNORMAL HIGH (ref 0–99)
Triglycerides: 172 mg/dL — ABNORMAL HIGH (ref 0–149)
VLDL Cholesterol Cal: 31 mg/dL (ref 5–40)

## 2019-10-04 LAB — URINE CYTOLOGY ANCILLARY ONLY
Chlamydia: NEGATIVE
Comment: NEGATIVE
Comment: NORMAL
Neisseria Gonorrhea: NEGATIVE

## 2019-10-04 LAB — ERYTHROPOIETIN: Erythropoietin: 8.2 m[IU]/mL (ref 2.6–18.5)

## 2019-10-05 LAB — FECAL OCCULT BLOOD, IMMUNOCHEMICAL: Fecal Occult Bld: NEGATIVE

## 2019-10-05 NOTE — Progress Notes (Signed)
Internal Medicine Clinic Attending  Case discussed with Dr. Coe  At the time of the visit.  We reviewed the resident's history and exam and pertinent patient test results.  I agree with the assessment, diagnosis, and plan of care documented in the resident's note.  

## 2019-10-08 ENCOUNTER — Ambulatory Visit (INDEPENDENT_AMBULATORY_CARE_PROVIDER_SITE_OTHER): Payer: Self-pay | Admitting: Student

## 2019-10-08 DIAGNOSIS — N410 Acute prostatitis: Secondary | ICD-10-CM

## 2019-10-08 DIAGNOSIS — J449 Chronic obstructive pulmonary disease, unspecified: Secondary | ICD-10-CM

## 2019-10-08 DIAGNOSIS — I10 Essential (primary) hypertension: Secondary | ICD-10-CM

## 2019-10-08 DIAGNOSIS — R399 Unspecified symptoms and signs involving the genitourinary system: Secondary | ICD-10-CM

## 2019-10-08 DIAGNOSIS — D751 Secondary polycythemia: Secondary | ICD-10-CM

## 2019-10-08 DIAGNOSIS — J40 Bronchitis, not specified as acute or chronic: Secondary | ICD-10-CM

## 2019-10-08 MED ORDER — ALBUTEROL SULFATE HFA 108 (90 BASE) MCG/ACT IN AERS: 2.0000 | INHALATION_SPRAY | Freq: Four times a day (QID) | RESPIRATORY_TRACT | 3 refills | Status: DC | PRN

## 2019-10-08 MED ORDER — SULFAMETHOXAZOLE-TRIMETHOPRIM 400-80 MG PO TABS: 1.0000 | ORAL_TABLET | Freq: Two times a day (BID) | ORAL | 0 refills | Status: AC

## 2019-10-08 NOTE — Progress Notes (Signed)
   CC: Follow-up  HPI:  [name] is a 54 y.o. male w/ PMH of chronic pain, COPD, drug abuse, enlarged prostate, and HTN who presents for a follow up after an evaluation for dysuria. Patient states he continues to have pain during urination and states he feels the pain is coming form his prostate. He complains of associated nausea and some urgency but denies any abd pain, frequency or hematuria. Patient also states he has been out of his albuterol inhaler so he has had some occasional SOB.   Of note, patient states he did not feel well and had palpitations when he was asked to stop taking his metoprolol so he started taking them twice a day which made him feel better. States his HR has been up and down for a long time and he is scheduled to see his cardiologist Monday to manage his BP meds and bradycardia. He denies any dizziness, CP Please see problem based charting for evaluation, assessment and plan.  Past Medical History:  Diagnosis Date  . Alcohol withdrawal ([place])   . Allergy   . Anxiety Dx 2014  . Chronic low back pain (Secondary area of Pain) (Bilateral) (R>L) 12/28/2012  . COPD (chronic obstructive pulmonary disease) ([place])   . Drug abuse ([place])    pt reports opioid dependence due to previous back surgeries  . Dyspepsia   . Elevated LFTs 08/15/2018  . Enlarged prostate   . ETOH abuse   . GERD (gastroesophageal reflux disease) Dx 2003  . Headache(784.0)   . Hepatitis   . Hyperlipidemia Dx 2000  . Hypertension Dx 2011  . Hypokalemia   . Hyponatremia 08/15/2018  . IBS (irritable bowel syndrome)    alternates constiaption/ diarrhea  . Irregular heart beat   . 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 Mental disorder   . Neuromuscular disorder ([place])    hands hurt   . Neuropathy 06/07/2013  . Opiate use 05/14/2016  . Pain management contract broken 12/15/2017   Pain medication contract broken on 12/15/2017 and again around 05/17/2019.  The patient continues to be noncompliant with the  medication rules and regulations.  He also continues to consume alcohol and to use more medication than prescribed.   . Seizure due to alcohol withdrawal ([place]) 01/23/2014   Per patient report  . Shortness of breath   . Withdrawal seizures ([place]) 2014   last seizure 2014 etoh with drawal   Review of Systems:  All ROS negative otherwise as stated in the HPI  Physical Exam:  General: Pleasant middle-aged man. No acute distress. Well nourished, well developed. Appears stated age Head: Normocephalic, atraumatic w/o tenderness Eyes: PERRLA. Sclera is non-icteric Throat: MMM. No tonsillar swelling or exudate. Neck: Supple without adenopathy. Thyroid gland midline without masees Cardiac: RRR. No murmurs, rubs or gallops. S1, S2. No lower extremities edema Respiratory: Moderate expiratory and inspiratory wheezes at bilateral lung apex. No increased WOB. No accessory muscle use. No crackles.  Abdominal: Soft, symmetric and non tender. No organomegaly. Normal bowel sounds Skin: Warm, dry and intact without rashes or lesions Extremities: Atraumatic. Full ROM. Pulse palpable. Neuro: A&O x 3. Moves all extremities Psych: Appropriate mood and affect. Normal judgement  Vitals:   10/08/19 1542  BP: (!) 158/93  Pulse: 58  SpO2: 98%  Weight: (!) 215 lb 3.2 oz (97.6 kg)    Assessment & Plan:   See Encounters Tab for problem based charting.  Patient seen with Dr. Louann Liv, MD, MPH

## 2019-10-08 NOTE — Patient Instructions (Addendum)
Thank you, Mr.Dequincy Harm Jou for allowing [place] to provide your care today. Today we discussed your blood pressure, your heart rate, your nausea, your difficult urination and your gum pain.  We are starting you on antibiotics to help treat a likely infection of your prostate.  Follow-up in 4 weeks for further assessment.  I have ordered the following medication/changed the following medications:  1.  Start Bactrim, take 1 tablet every 12 hours for 4 weeks  Please follow-up in 4 weeks  Should you have any questions or concerns please call the internal medicine clinic at [phone].    Linwood Dibbles, MD, MPH [facility]   My Chart Access: [email or web address]   If you have not already done so, please get your COVID 19 vaccine  To schedule an appointment for a COVID vaccine choice any of the following: Go to [email or web address]   Go to [email or web address]                  Call [phone]                                     Call [phone] and select Option 2

## 2019-10-09 ENCOUNTER — Encounter: Payer: Self-pay | Admitting: Student

## 2019-10-09 DIAGNOSIS — N419 Inflammatory disease of prostate, unspecified: Secondary | ICD-10-CM | POA: Insufficient documentation

## 2019-10-09 DIAGNOSIS — J449 Chronic obstructive pulmonary disease, unspecified: Secondary | ICD-10-CM | POA: Insufficient documentation

## 2019-10-09 MED FILL — SULFAMETHOXAZOLE-TMP SS TAB: 400-80 | 30 days supply | Qty: 60 | Fill #0

## 2019-10-09 MED FILL — ALBUTEROL SULFATE HFA 108 (: 108 (90 BAS | 17 days supply | Qty: 9 | Fill #0

## 2019-10-09 NOTE — Assessment & Plan Note (Signed)
Patient states he is out of his albuterol and has had occasional difficulty taking deep breath. He endorse mild dry cough but denies any CP or palpitations.  Plan: --Refilled his as needed albuterol

## 2019-10-09 NOTE — Assessment & Plan Note (Signed)
Patient with unexplained intermittent polycythemia with hgb levels as high as 20. EPO levels normal. Would need further evaluation at next follow up visit.  Plan: --Consider work up for isolated polycythemia at next follow up visit

## 2019-10-09 NOTE — Assessment & Plan Note (Signed)
Patient presents with ongoing dysuria that he describe as a sharp pain at the end of his urethra and around his prostate. Patient complains of associated nausea but no hematuria, vomiting, fever or chills. Patient states he has significant pain during his prostate exam at last visit and he was treated in the past with antibiotic for a prostate infection. UA and urine culture negative. Normal STI testing. Considering patient's enlarged and tender prostate, dysuria, nausea and hx of prostate infection, this is likely acute prostatitis.   Plan:  --Start Bactrium 400-80 mg, take 1 tab every 12 hours for 4 weeks --Continue Flomax 0.4 mg daily --Follow up in 4 weeks for further assessment

## 2019-10-09 NOTE — Assessment & Plan Note (Addendum)
BP elevated today in clinic to 158/93 with a HR of 58. Patient reports he experienced palpitations causing him to not feel well when he stopped taking his metoprolol 25 mg. He went back to taking it twice a day and he started feeling better. BP still elevated so patient will require an agent. Patient has an appointment with his cardiologist on 8/2.  Plan: --Follow up cardiologist recommendations for management of his BP and HR. --Follow up in 4 weeks for BP and HR check

## 2019-10-10 NOTE — Progress Notes (Signed)
Internal Medicine Clinic Attending  I saw and evaluated the patient.  I personally confirmed the key portions of the history and exam documented by Dr. Coy Saunas and I reviewed pertinent patient test results.  The assessment, diagnosis, and plan were formulated together and I agree with the documentation in the resident's note.  Mr. Haack has felt very poorly recently and describes his recent prostate exam as being extremely painful, similar to prior episodes of prostatitis, as Dr. Coy Saunas has documented.  I agree with empiric treatment with antibiotics.  If symptoms don't resolve, he will need urology referral.

## 2019-10-11 MED FILL — BUPRENORPHIN-NALOXON 8-2 MG: 8-2 | 14 days supply | Qty: 35 | Fill #1

## 2019-10-14 ENCOUNTER — Telehealth: Payer: Self-pay | Admitting: *Deleted

## 2019-10-14 NOTE — Telephone Encounter (Signed)
Call from pt requesting an OUD appt ; stated he will be out of suboxone on the 16th or 17th. Appt scheduled on Tues 8/17 @ 0915 AM.

## 2019-10-14 NOTE — Telephone Encounter (Signed)
Agree with appointment

## 2019-10-26 ENCOUNTER — Ambulatory Visit (INDEPENDENT_AMBULATORY_CARE_PROVIDER_SITE_OTHER): Payer: Self-pay | Admitting: Internal Medicine

## 2019-10-26 ENCOUNTER — Other Ambulatory Visit: Payer: Self-pay

## 2019-10-26 VITALS — BP 134/77 | HR 66 | Temp 98.7°F | Ht 76.0 in | Wt 212.6 lb

## 2019-10-26 DIAGNOSIS — M792 Neuralgia and neuritis, unspecified: Secondary | ICD-10-CM

## 2019-10-26 DIAGNOSIS — F1020 Alcohol dependence, uncomplicated: Secondary | ICD-10-CM

## 2019-10-26 DIAGNOSIS — F1120 Opioid dependence, uncomplicated: Secondary | ICD-10-CM

## 2019-10-26 DIAGNOSIS — R109 Unspecified abdominal pain: Secondary | ICD-10-CM

## 2019-10-26 MED ORDER — DICYCLOMINE HCL 10 MG PO CAPS: 20.0000 mg | ORAL_CAPSULE | Freq: Three times a day (TID) | ORAL | 0 refills | Status: DC | PRN

## 2019-10-26 MED ORDER — SENNA 8.6 MG PO TABS
1.0000 | ORAL_TABLET | Freq: Every day | ORAL | 2 refills | Status: DC
Start: 2019-10-26 — End: 2020-02-08

## 2019-10-26 MED ORDER — BUPRENORPHINE HCL-NALOXONE HCL 8-2 MG SL SUBL: SUBLINGUAL_TABLET | SUBLINGUAL | 1 refills | Status: DC

## 2019-10-26 MED ORDER — SENNA 8.6 MG PO TABS: 1.0000 | ORAL_TABLET | Freq: Every day | ORAL | 2 refills | Status: DC

## 2019-10-26 MED FILL — BUPREN-NALOX SL TAB 8-2MG: 8-2 | 14 days supply | Qty: 35 | Fill #0

## 2019-10-26 MED FILL — DICYCLOMINE 10 MG CAPSULE: 10 | 5 days supply | Qty: 30 | Fill #0

## 2019-10-26 NOTE — Assessment & Plan Note (Signed)
Assessment/plan: Mark [name] endorses continued cravings that are mostly triggered by his chronic uncontrolled pain with most recent pain source being his neuropathy.  He feels gabapentin is not significantly controlling his neuropathy.  At this time, he would not benefit from increased dose of Suboxone, as it will not provide increased analgesia.  We will continue with current dosage and recommend close follow-up with PCP for work-up of neurogenic pain.  -Continue Suboxone 8-2 mg, 2.5 tablets per day  -4-week follow-up -Tox assure pending

## 2019-10-26 NOTE — Assessment & Plan Note (Signed)
Assessment/plan: Mark [name] continues to use daily alcohol, however quantity has decreased significantly per chart review.  Discussed extensively the importance of complete cessation and Mark [name] expressed understanding.  Encouraged that he reach out to the clinic if he is at all struggling with cessation so we can provide assistance.

## 2019-10-26 NOTE — Patient Instructions (Addendum)
It was nice seeing you today! Thank you for choosing [facility].    Today we talked about:   1. Suboxone Prescription: We sent in a 1 month refill to the pharmacy  2. Alcohol Use: We talked today about the importance of cutting out alcohol completely as it is a big contributor to your nerve pain in your legs and heart problems. Let us know if we can help in any way with this.   3. Stomach cramps: Please start taking daily Senna. I also sent in a prescription for Bentyl to be taken only as needed for severe cramps.

## 2019-10-26 NOTE — Progress Notes (Signed)
10/26/2019  Mark [name] presents for follow up of opioid use disorder I have reviewed the prior induction visit, follow up visits, and telephone encounters relevant to opiate use disorder (OUD) treatment.   Current daily dose: Suboxone 8-2 mg 2.5 tablets daily   Date of Induction: 05/21/19  Current follow up interval, in weeks: 4 weeks   The patient has been adherent with the buprenorphine for OUD contract.   Last UDS Result: 08/31/2019, Suboxone present and expected. Unexpected components were oxazepam metabolites and ethanol metabolites.   HPI:   [name] states that he has been doing well relatively speaking in the past several weeks.  He notes that he takes his Suboxone 1 in the morning, half in the midday, 1 in the nighttime.  He notes that he is having some daily cravings but feels this is mostly secondary to his chronic pain that is uncontrolled.  He states the main source of his pain at this time is his bilateral extremities, which he describes significant sensitivity to touch but overall numbness at baseline.  He states that both his feet are affected extending up to the calf and thigh, and the symptoms have been spreading upwards.  He endorses similar symptoms in his bilateral fingertips.  He denies any back pain at this time but notes he has chronic back pain.  [name] also notes that he has been drinking couple beers every day mostly in a social aspect as his mother sister who lives with him are having beers at night as well.  He states in the past he used to drink 2 manage his pain but is no longer drinking heavily and is avoiding liquor.  He is fully aware and understands the implications of alcohol with his heart and his neuropathy.  He notes that he recently followed up with Dr. Terrence Mark [name] who did not make any changes to his medication.  [name] is also wondering if he could have his mental refilled, as he was previously prescribed this by the current wellness  center.  He states that on a daily basis he is having severe abdominal cramps and he can feel his stomach tightening down.  He endorses constipation as well.  Exam:   Vitals:   10/26/19 0903  Weight: 212 lb 9.6 oz (96.4 kg)  Height: 6\' 4"  (1.93 m)   Physical Exam Vitals and nursing note reviewed.  Constitutional:      General: He is not in acute distress.    Appearance: He is normal weight.  Cardiovascular:     Rate and Rhythm: Normal rate and regular rhythm.     Heart sounds: No murmur heard.   Pulmonary:     Effort: Pulmonary effort is normal. No respiratory distress.     Breath sounds: No wheezing.  Abdominal:     General: Bowel sounds are normal. There is no distension.     Palpations: Abdomen is soft.     Tenderness: There is no abdominal tenderness. There is no guarding.  Skin:    General: Skin is warm and dry.     Coloration: Skin is not jaundiced.  Neurological:     General: No focal deficit present.     Mental Status: He is alert and oriented to person, place, and time. Mental status is at baseline.     Gait: Gait normal.  Psychiatric:        Mood and Affect: Mood normal.        Behavior: Behavior normal.  Assessment/Plan:  See Problem Based Charting in the Encounters Tab  Mark Persia, MD 10/26/2019  9:11 AM

## 2019-10-26 NOTE — Assessment & Plan Note (Signed)
Mr. Ogan endorses long-term history of constipation and abdominal cramps that have been more uncontrolled since his Bentyl was discontinued.  He denies any nausea, vomiting, diarrhea, hematochezia or melena.  He is hoping to have his Bentyl refilled.  Assessment/plan: Per chart review Bentyl was initiated by Dr. Havery Moros at the [facility].  I cannot find the reason it was discontinued and will provide patient with a refill today for as needed use.  Given that his abdominal cramps may be instigated by constipation from chronic Suboxone, will also start daily senna.  -Senna daily -Bentyl 20 mg 3 times daily as needed

## 2019-10-26 NOTE — Assessment & Plan Note (Signed)
Assessment/Plan: Differential is wide and includes radiculopathy versus vitamin deficiency from long term alcohol use disorder.  Extensive discussion with patient on the importance of cessation of daily alcohol use.  At this time recommend continuing gabapentin and scheduling follow-up with PCP to discuss further.

## 2019-10-27 NOTE — Progress Notes (Signed)
Internal Medicine Clinic Attending  I saw and evaluated the patient.  I personally confirmed the key portions of the history and exam documented by Dr. Basaraba and I reviewed pertinent patient test results.  The assessment, diagnosis, and plan were formulated together and I agree with the documentation in the resident's note.    

## 2019-10-29 LAB — TOXASSURE SELECT,+ANTIDEPR,UR

## 2019-11-08 MED FILL — BUPREN-NALOX SL TAB 8-2MG: 8-2 | 14 days supply | Qty: 35 | Fill #1

## 2019-11-08 MED FILL — BACLOFEN 10 MG TABS: 10 | 30 days supply | Qty: 90 | Fill #2

## 2019-11-10 ENCOUNTER — Other Ambulatory Visit: Payer: Self-pay

## 2019-11-10 ENCOUNTER — Encounter: Payer: Self-pay | Admitting: Internal Medicine

## 2019-11-10 ENCOUNTER — Ambulatory Visit (INDEPENDENT_AMBULATORY_CARE_PROVIDER_SITE_OTHER): Payer: Self-pay | Admitting: Internal Medicine

## 2019-11-10 VITALS — BP 130/59 | HR 72 | Temp 98.4°F | Ht 76.0 in | Wt 214.8 lb

## 2019-11-10 DIAGNOSIS — N410 Acute prostatitis: Secondary | ICD-10-CM

## 2019-11-10 DIAGNOSIS — R109 Unspecified abdominal pain: Secondary | ICD-10-CM

## 2019-11-10 LAB — POCT URINALYSIS DIPSTICK
Bilirubin, UA: NEGATIVE
Blood, UA: NEGATIVE
Glucose, UA: NEGATIVE
Ketones, UA: NEGATIVE
Leukocytes, UA: NEGATIVE
Nitrite, UA: NEGATIVE
Protein, UA: NEGATIVE
Spec Grav, UA: 1.015 (ref 1.010–1.025)
Urobilinogen, UA: 0.2 E.U./dL
pH, UA: 6 (ref 5.0–8.0)

## 2019-11-10 MED ORDER — DICYCLOMINE HCL 10 MG PO CAPS: 20.0000 mg | ORAL_CAPSULE | Freq: Three times a day (TID) | ORAL | 0 refills | Status: DC | PRN

## 2019-11-10 MED ORDER — CIPROFLOXACIN HCL 500 MG PO TABS: 500.0000 mg | ORAL_TABLET | Freq: Two times a day (BID) | ORAL | 0 refills | Status: DC

## 2019-11-10 MED ORDER — PANTOPRAZOLE SODIUM 20 MG PO TBEC: 20.0000 mg | DELAYED_RELEASE_TABLET | Freq: Every day | ORAL | 1 refills | Status: DC

## 2019-11-10 MED FILL — DICYCLOMINE 10 MG CAPSULE: 10 | 15 days supply | Qty: 90 | Fill #0

## 2019-11-10 MED FILL — PANTOPRAZOLE SOD DR 20 MG T: 20 | 30 days supply | Qty: 30 | Fill #0

## 2019-11-10 MED FILL — CIPROFLOXACIN HCL 500 MG TA: 500 | 28 days supply | Qty: 56 | Fill #0

## 2019-11-10 MED FILL — GABAPENTIN 100 MG CAPSULE: 100 | 30 days supply | Qty: 270 | Fill #1

## 2019-11-10 MED FILL — METOPROLOL TARTRATE 25 MG T: 25 | 90 days supply | Qty: 270 | Fill #0

## 2019-11-10 NOTE — Patient Instructions (Addendum)
To Mark [name],  It was a pleasure working with you today. Today we addressed your prostatitis. We will start you on a antibiotic called ciprofloxacin. Please take the medication daily as described on the label. I will see you back in 4 weeks for follow up.   Sincerely,  Maudie Mercury, MD

## 2019-11-10 NOTE — Progress Notes (Signed)
   CC: Abdominal Pain   HPI:  Mark [name] is a 54 y.o. male presenting to the clinic for abdominal pain, and follow up on his prostatitis. To see the management of his acute and chronic medications, please see the A&P notes under the Encounter tab.    Past Medical History:  Diagnosis Date  . Alcohol withdrawal ([place])   . Allergy   . Anxiety Dx 2014  . Chronic low back pain (Secondary area of Pain) (Bilateral) (R>L) 12/28/2012  . COPD (chronic obstructive pulmonary disease) ([place])   . Drug abuse ([place])    pt reports opioid dependence due to previous back surgeries  . Dyspepsia   . Elevated LFTs 08/15/2018  . Enlarged prostate   . ETOH abuse   . GERD (gastroesophageal reflux disease) Dx 2003  . Headache(784.0)   . Hepatitis   . Hyperlipidemia Dx 2000  . Hypertension Dx 2011  . Hypokalemia   . Hyponatremia 08/15/2018  . IBS (irritable bowel syndrome)    alternates constiaption/ diarrhea  . Irregular heart beat   . 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 Mental disorder   . Neuromuscular disorder ([place])    hands hurt   . Neuropathy 06/07/2013  . Opiate use 05/14/2016  . Pain management contract broken 12/15/2017   Pain medication contract broken on 12/15/2017 and again around 05/17/2019.  The patient continues to be noncompliant with the medication rules and regulations.  He also continues to consume alcohol and to use more medication than prescribed.   . Seizure due to alcohol withdrawal ([place]) 01/23/2014   Per patient report  . Shortness of breath   . Withdrawal seizures ([place]) 2014   last seizure 2014 etoh with drawal   Review of Systems:   Review of Systems  Constitutional: Negative for chills and fever.  HENT: Negative for ear discharge, ear pain, hearing loss and tinnitus.   Eyes: Negative for blurred vision, double vision and photophobia.  Cardiovascular: Negative for chest pain and palpitations.  Gastrointestinal: Positive for abdominal pain. Negative for blood in stool,  constipation, diarrhea, melena, nausea and vomiting.  Genitourinary: Positive for dysuria.  Musculoskeletal: Negative for myalgias and neck pain.  Neurological: Negative for dizziness, tingling and headaches.    Physical Exam:  Vitals:   11/10/19 1012  BP: (!) 130/59  Pulse: 72  Temp: 98.4 F (36.9 C)  TempSrc: Oral  SpO2: 99%  Weight: 214 lb 12.8 oz (97.4 kg)  Height: 6\' 4"  (1.93 m)   Physical Exam Constitutional:      General: He is not in acute distress.    Appearance: Normal appearance. He is not ill-appearing or toxic-appearing.  HENT:     Head: Normocephalic and atraumatic.     Mouth/Throat:     Mouth: Mucous membranes are moist.     Comments: Multiple dental carries.  Cardiovascular:     Rate and Rhythm: Normal rate and regular rhythm.     Pulses: Normal pulses.     Heart sounds: Normal heart sounds. No murmur heard.  No friction rub. No gallop.   Pulmonary:     Effort: Pulmonary effort is normal.     Breath sounds: Normal breath sounds. No wheezing, rhonchi or rales.  Genitourinary:    Comments: Prostate boggy, tenderness to palpation. Neurological:     Mental Status: He is alert.      Assessment & Plan:   See Encounters Tab for problem based charting.  Patient discussed with Dr. Evette Doffing

## 2019-11-11 ENCOUNTER — Encounter: Payer: Self-pay | Admitting: Internal Medicine

## 2019-11-11 LAB — URINALYSIS, ROUTINE W REFLEX MICROSCOPIC
Bilirubin, UA: NEGATIVE
Glucose, UA: NEGATIVE
Ketones, UA: NEGATIVE
Leukocytes,UA: NEGATIVE
Nitrite, UA: NEGATIVE
Protein,UA: NEGATIVE
RBC, UA: NEGATIVE
Specific Gravity, UA: <=1.005 — AB (ref 1.005–1.030)
Urobilinogen, Ur: 0.2 mg/dL (ref 0.2–1.0)
pH, UA: 6 (ref 5.0–7.5)

## 2019-11-11 NOTE — Assessment & Plan Note (Signed)
Patient presents to the office with complaints of continued abdominal cramping. He has run out of his Bentyl and is asking for refills on his medication. We discussed why his previous Bentyl was discontinued. He states that it was originally prescribed for his abdominal cramps, but it was difficult to get an appointment with his original PCP due to rotating physicians. He states that he is compliant with the Senna and has not had any constipation since his last visit.  - refill Bentyl TID PRN

## 2019-11-11 NOTE — Progress Notes (Signed)
Internal Medicine Clinic Attending ? ?Case discussed with Dr. Winters  At the time of the visit.  We reviewed the resident?s history and exam and pertinent patient test results.  I agree with the assessment, diagnosis, and plan of care documented in the resident?s note.  ?

## 2019-11-11 NOTE — Assessment & Plan Note (Signed)
Patient presents with complaints of ongoing dysuria and prostatitis. He states that he self discontinued his Bactrim as it made his abdominal cramping worse, and is asking to be restarted on a different antibiotic. On rectal examination, prostate was boggy, and tender to palpation. We discussed the importance of adhering to his medication and to call the clinic if he experiences any side effects. Patient voiced understanding.  - Cipro 500 mg BID for 4 weeks - Urine culture

## 2019-11-12 LAB — URINE CULTURE: Organism ID, Bacteria: NO GROWTH

## 2019-11-16 ENCOUNTER — Telehealth: Payer: Self-pay | Admitting: Internal Medicine

## 2019-11-16 DIAGNOSIS — F1120 Opioid dependence, uncomplicated: Secondary | ICD-10-CM

## 2019-11-16 MED ORDER — BUPRENORPHINE HCL-NALOXONE HCL 8-2 MG SL SUBL: SUBLINGUAL_TABLET | SUBLINGUAL | 1 refills | Status: DC

## 2019-11-16 NOTE — Telephone Encounter (Signed)
Patient returned call for 1 month re-scheduling for OUD clinic.  Will send in 4 week refill and follow up in October.   Gilles Chiquito, MD

## 2019-11-22 MED FILL — BUPREN-NALOX SL TAB 8-2MG: 8-2 | 14 days supply | Qty: 35 | Fill #0

## 2019-12-04 MED FILL — BACLOFEN 10 MG TABS: 10 | 30 days supply | Qty: 90 | Fill #3

## 2019-12-06 MED FILL — MAGNESIUM OXIDE 500 MG TAB: 500 | 50 days supply | Qty: 100 | Fill #1

## 2019-12-06 MED FILL — BUPREN-NALOX SL TAB 8-2MG: 8-2 | 14 days supply | Qty: 35 | Fill #1

## 2019-12-08 ENCOUNTER — Other Ambulatory Visit: Payer: Self-pay | Admitting: Internal Medicine

## 2019-12-08 ENCOUNTER — Ambulatory Visit (INDEPENDENT_AMBULATORY_CARE_PROVIDER_SITE_OTHER): Payer: Self-pay | Admitting: Internal Medicine

## 2019-12-08 ENCOUNTER — Encounter: Payer: Self-pay | Admitting: Internal Medicine

## 2019-12-08 ENCOUNTER — Other Ambulatory Visit: Payer: Self-pay

## 2019-12-08 DIAGNOSIS — K219 Gastro-esophageal reflux disease without esophagitis: Secondary | ICD-10-CM

## 2019-12-08 DIAGNOSIS — M7918 Myalgia, other site: Secondary | ICD-10-CM

## 2019-12-08 DIAGNOSIS — R42 Dizziness and giddiness: Secondary | ICD-10-CM

## 2019-12-08 DIAGNOSIS — N410 Acute prostatitis: Secondary | ICD-10-CM

## 2019-12-08 DIAGNOSIS — I472 Ventricular tachycardia: Secondary | ICD-10-CM

## 2019-12-08 DIAGNOSIS — M62830 Muscle spasm of back: Secondary | ICD-10-CM

## 2019-12-08 DIAGNOSIS — I4729 Other ventricular tachycardia: Secondary | ICD-10-CM

## 2019-12-08 DIAGNOSIS — G8929 Other chronic pain: Secondary | ICD-10-CM

## 2019-12-08 DIAGNOSIS — J40 Bronchitis, not specified as acute or chronic: Secondary | ICD-10-CM

## 2019-12-08 DIAGNOSIS — I4581 Long QT syndrome: Secondary | ICD-10-CM

## 2019-12-08 MED ORDER — DOXYCYCLINE HYCLATE 100 MG PO CAPS: 100.0000 mg | ORAL_CAPSULE | Freq: Two times a day (BID) | ORAL | 0 refills | Status: DC

## 2019-12-08 MED ORDER — PANTOPRAZOLE SODIUM 20 MG PO TBEC: 20.0000 mg | DELAYED_RELEASE_TABLET | Freq: Every day | ORAL | 1 refills | Status: DC

## 2019-12-08 MED ORDER — DICYCLOMINE HCL 10 MG PO CAPS: 20.0000 mg | ORAL_CAPSULE | Freq: Three times a day (TID) | ORAL | 2 refills | Status: DC | PRN

## 2019-12-08 MED ORDER — ALBUTEROL SULFATE HFA 108 (90 BASE) MCG/ACT IN AERS: 2.0000 | INHALATION_SPRAY | Freq: Four times a day (QID) | RESPIRATORY_TRACT | 3 refills | Status: DC | PRN

## 2019-12-08 MED ORDER — MAGNESIUM OXIDE -MG SUPPLEMENT 500 MG PO CAPS: 1.0000 | ORAL_CAPSULE | Freq: Every day | ORAL | 11 refills | Status: DC

## 2019-12-08 MED FILL — DICYCLOMINE 10 MG CAPSULE: 10 | 15 days supply | Qty: 90 | Fill #0

## 2019-12-08 MED FILL — DOXYCYCLINE HYCLATE 100 MG: 100 | 14 days supply | Qty: 28 | Fill #0

## 2019-12-08 MED FILL — PANTOPRAZOLE SOD DR 20 MG T: 20 | 30 days supply | Qty: 30 | Fill #0

## 2019-12-08 MED FILL — ALBUTEROL SULFATE HFA 108 (: 108 (90 BAS | 25 days supply | Qty: 9 | Fill #0

## 2019-12-08 NOTE — Assessment & Plan Note (Signed)
Patient states GERD symptoms are well controlled on Protonix. He presents today for a refill. - Reordered Protonix 20 mg daily

## 2019-12-08 NOTE — Assessment & Plan Note (Signed)
Patient presents to the clinic with occasional dizziness. It is mostly appreciated from sitting to standing position. He endorses good hydration. His orthostatic vital signs were WNL.  He is currently on Flomax which may have some effect on his symptoms, but he also does have a history of severe neuropathy. He denies palpitations, irregular heart beats. Likely secondary to neuropathy. Discussed countermeasures, and will evaluate at next appointment.  - Discussed adaption measurements with patient.  - FU next visit if symptoms have resolved

## 2019-12-08 NOTE — Patient Instructions (Addendum)
Mark [name],  It was a pleasure seeing you again. Today we will try a 4 week trial of doxycyline for your prostatitis. If treatment fails, we will refer you to urology. For your dizziness, it could be due to your medications and neuropathy. Please get up slowly and stand until your dizziness settles. We will reassess you in 4 weeks time. Have a good day!  Sincerely,  Maudie Mercury, MD

## 2019-12-08 NOTE — Assessment & Plan Note (Signed)
Patient states that he is still symptomatic at today's visit. He has failed course of Bactrim and Cipro. He states that his urologist had him take doxycyline in the past for his prostatitis with resolution of his symptoms. We discussed following up with his urologist pending this last antibiotic trial. Patient is agreeable to the plan.  - Doxycyline 100 mg Twice daily for 14 days - Reassess in one month's time, if symptomatic, refer to urology for prostatic culture.

## 2019-12-08 NOTE — Progress Notes (Signed)
CC: Follow up for prostatitis   HPI:  Mr.Rockie Wellington Winegarden is a 54 y.o. male, with a PMH noted below, who presents to the clinic for follow up for his prostatitis. To see the management of their acute and chronic conditions, please see the A&P note under the Encounters tab.   Past Medical History:  Diagnosis Date  . Alcohol withdrawal ([place])   . Allergy   . Anxiety Dx 2014  . Chronic low back pain (Secondary area of Pain) (Bilateral) (R>L) 12/28/2012  . COPD (chronic obstructive pulmonary disease) ([place])   . Drug abuse ([place])    pt reports opioid dependence due to previous back surgeries  . Dyspepsia   . Elevated LFTs 08/15/2018  . Enlarged prostate   . ETOH abuse   . GERD (gastroesophageal reflux disease) Dx 2003  . Headache(784.0)   . Hepatitis   . Hyperlipidemia Dx 2000  . Hypertension Dx 2011  . Hypokalemia   . Hyponatremia 08/15/2018  . IBS (irritable bowel syndrome)    alternates constiaption/ diarrhea  . Irregular heart beat   . 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 Mental disorder   . Neuromuscular disorder ([place])    hands hurt   . Neuropathy 06/07/2013  . Opiate use 05/14/2016  . Pain management contract broken 12/15/2017   Pain medication contract broken on 12/15/2017 and again around 05/17/2019.  The patient continues to be noncompliant with the medication rules and regulations.  He also continues to consume alcohol and to use more medication than prescribed.   . Seizure due to alcohol withdrawal ([place]) 01/23/2014   Per patient report  . Shortness of breath   . Withdrawal seizures ([place]) 2014   last seizure 2014 etoh with drawal   Review of Systems:   Review of Systems  Constitutional: Negative for chills, malaise/fatigue and weight loss.  Cardiovascular: Negative for chest pain and palpitations.  Gastrointestinal: Negative for abdominal pain, constipation, diarrhea, nausea and vomiting.  Genitourinary: Negative for flank pain, frequency, hematuria and urgency.    Neurological: Positive for dizziness. Negative for weakness.    Physical Exam:  Vitals:   12/08/19 0943  BP: 132/73  Pulse: (!) 44  Temp: 97.8 F (36.6 C)  TempSrc: Oral  SpO2: 100%  Weight: 213 lb (96.6 kg)  Height: 6\' 4"  (1.93 m)   Physical Exam Vitals and nursing note reviewed.  Constitutional:      General: He is not in acute distress.    Appearance: Normal appearance. He is not ill-appearing or toxic-appearing.  HENT:     Head: Normocephalic and atraumatic.  Eyes:     General:        Right eye: No discharge.        Left eye: No discharge.     Conjunctiva/sclera: Conjunctivae normal.  Cardiovascular:     Rate and Rhythm: Normal rate and regular rhythm.     Pulses: Normal pulses.     Heart sounds: Normal heart sounds. No murmur heard.  No friction rub. No gallop.   Pulmonary:     Effort: Pulmonary effort is normal.     Breath sounds: Normal breath sounds. No wheezing, rhonchi or rales.  Abdominal:     General: Bowel sounds are normal.     Palpations: Abdomen is soft.     Tenderness: There is no abdominal tenderness. There is no guarding.  Musculoskeletal:        General: No swelling.     Right lower leg: No edema.  Left lower leg: No edema.  Neurological:     General: No focal deficit present.     Mental Status: He is alert and oriented to person, place, and time.  Psychiatric:        Mood and Affect: Mood normal.        Behavior: Behavior normal.     Assessment & Plan:   See Encounters Tab for problem based charting.  Patient discussed with Dr. Daryll Drown

## 2019-12-13 NOTE — Progress Notes (Signed)
Internal Medicine Clinic Attending ? ?Case discussed with Dr. Winters  At the time of the visit.  We reviewed the resident?s history and exam and pertinent patient test results.  I agree with the assessment, diagnosis, and plan of care documented in the resident?s note.  ?

## 2019-12-14 ENCOUNTER — Ambulatory Visit (INDEPENDENT_AMBULATORY_CARE_PROVIDER_SITE_OTHER): Payer: Self-pay | Admitting: Internal Medicine

## 2019-12-14 ENCOUNTER — Other Ambulatory Visit: Payer: Self-pay

## 2019-12-14 ENCOUNTER — Other Ambulatory Visit: Payer: Self-pay | Admitting: Student in an Organized Health Care Education/Training Program

## 2019-12-14 VITALS — BP 130/53 | HR 45 | Temp 98.1°F | Ht 76.0 in | Wt 213.4 lb

## 2019-12-14 DIAGNOSIS — F1120 Opioid dependence, uncomplicated: Secondary | ICD-10-CM

## 2019-12-14 DIAGNOSIS — F11281 Opioid dependence with opioid-induced sexual dysfunction: Secondary | ICD-10-CM

## 2019-12-14 MED ORDER — BUPRENORPHINE HCL-NALOXONE HCL 8-2 MG SL SUBL: SUBLINGUAL_TABLET | SUBLINGUAL | 1 refills | Status: DC

## 2019-12-14 NOTE — Progress Notes (Signed)
   12/14/2019  Mark [name] presents for follow up of opioid use disorder I have reviewed the prior induction visit, follow up visits, and telephone encounters relevant to opiate use disorder (OUD) treatment.   Current daily dose: Suboxone 8-2 mg, 2.5 tablets daily   Date of Induction: 05/21/19  Current follow up interval, in weeks: 4 weeks  The patient has been adherent with the buprenorphine for OUD contract.   Last UDS Result: 10/26/2019 and consistent with suboxone use.   HPI: Mark [name] presents today for a follow up appointment for opioid use disorder.    Exam:   There were no vitals filed for this visit.  Physical Exam Constitutional:      Appearance: Normal appearance.  Cardiovascular:     Rate and Rhythm: Normal rate.     Pulses: Normal pulses.     Heart sounds: Normal heart sounds.  Pulmonary:     Effort: Pulmonary effort is normal.     Breath sounds: Normal breath sounds.  Musculoskeletal:        General: Normal range of motion.     Cervical back: Normal range of motion.     Right lower leg: No edema.     Left lower leg: No edema.  Skin:    General: Skin is warm and dry.  Neurological:     Mental Status: He is alert and oriented to person, place, and time. Mental status is at baseline.  Psychiatric:        Mood and Affect: Mood normal.      Assessment/Plan:  See Problem Based Charting in the Encounters Tab     [name], MD  12/14/2019  9:11 AM

## 2019-12-14 NOTE — Patient Instructions (Signed)
Thank you, Mark [name] Clerk for allowing [place] to provide your care today. Today we discussed Opioid use disorder.    I have ordered the following labs for you:   Lab Orders     ToxAssure Select,+Antidepr,UR   I will call if any are abnormal. All of your labs can be accessed through "My Chart".  I have place a referrals to none.  I have ordered the following tests: none   I have ordered the following medication/changed the following medications:  1. continue Suboxone 2.5 tablets daily  Please follow-up in 1 month with a telephone visit.  Should you have any questions or concerns please call the internal medicine clinic at [phone].    Mark [name], D.O. [facility]   My Chart Access: [email or web address]   If you have not already done so, please get your COVID 19 vaccine  To schedule an appointment for a COVID vaccine choice any of the following: Go to [email or web address]   Go to [email or web address]                  Call [phone]                                     Call [phone] and select Option 2

## 2019-12-15 ENCOUNTER — Encounter: Payer: Self-pay | Admitting: Internal Medicine

## 2019-12-15 NOTE — Assessment & Plan Note (Signed)
Patient presents for a follow up appointment for opioid use disorder. He states that his is taking Suboxone 2.5 mg tablets daily. His cravings are mostly controlled but states that they worsen when he is in pain. 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 He denies any relapses and admits to drinking a couple beers during the week, depsite being counseled on prior occasion regarding the affects of ETOH on his liver disease. He lives in [place], [place] with his daughter and is currently unemployed. 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   Patient enquired about increasing his dose do to inadequate pain relief. I told him that he is already at the max dose and suboxone does not work well at controlling chronic pain. I told him the only other alternative would be methadone, which we do not offer at our clinic. Patient agreed to continue taking his current dose of suboxone. PDMP reviewed today and is appropriate. Will continue current dose of suboxone as a part of a risk reduction strategy.   Plan: - Suboxone 8-2 mg, two and a half tablets daily.  - UTox today - Follow up in 1 month

## 2019-12-16 NOTE — Progress Notes (Signed)
Internal Medicine Clinic Attending  Case discussed with Dr. Coe  At the time of the visit.  We reviewed the resident's history and exam and pertinent patient test results.  I agree with the assessment, diagnosis, and plan of care documented in the resident's note.  

## 2019-12-18 LAB — TOXASSURE SELECT,+ANTIDEPR,UR

## 2019-12-20 MED FILL — BUPREN-NALOX SL TAB 8-2MG: 8-2 | 14 days supply | Qty: 35 | Fill #0

## 2020-01-03 MED FILL — DICYCLOMINE 10 MG CAPSULE: 10 | 15 days supply | Qty: 90 | Fill #1

## 2020-01-03 MED FILL — BUPREN-NALOX SL TAB 8-2MG: 8-2 | 14 days supply | Qty: 35 | Fill #1

## 2020-01-03 MED FILL — LOSARTAN POTASSIUM 25 MG TA: 25 | 45 days supply | Qty: 90 | Fill #0

## 2020-01-07 ENCOUNTER — Encounter: Payer: Self-pay | Admitting: Internal Medicine

## 2020-01-07 ENCOUNTER — Other Ambulatory Visit: Payer: Self-pay | Admitting: Internal Medicine

## 2020-01-07 ENCOUNTER — Other Ambulatory Visit: Payer: Self-pay

## 2020-01-07 ENCOUNTER — Ambulatory Visit (INDEPENDENT_AMBULATORY_CARE_PROVIDER_SITE_OTHER): Payer: Self-pay | Admitting: Internal Medicine

## 2020-01-07 VITALS — BP 129/74 | HR 62 | Temp 98.4°F | Wt 214.0 lb

## 2020-01-07 DIAGNOSIS — E782 Mixed hyperlipidemia: Secondary | ICD-10-CM

## 2020-01-07 DIAGNOSIS — F1020 Alcohol dependence, uncomplicated: Secondary | ICD-10-CM

## 2020-01-07 DIAGNOSIS — R7303 Prediabetes: Secondary | ICD-10-CM

## 2020-01-07 DIAGNOSIS — N410 Acute prostatitis: Secondary | ICD-10-CM

## 2020-01-07 DIAGNOSIS — R399 Unspecified symptoms and signs involving the genitourinary system: Secondary | ICD-10-CM

## 2020-01-07 LAB — POCT GLYCOSYLATED HEMOGLOBIN (HGB A1C): Hemoglobin A1C: 5.7 % — AB (ref 4.0–5.6)

## 2020-01-07 LAB — GLUCOSE, CAPILLARY: Glucose-Capillary: 93 mg/dL (ref 70–99)

## 2020-01-07 MED ORDER — TAMSULOSIN HCL 0.4 MG PO CAPS: 0.4000 mg | ORAL_CAPSULE | Freq: Every day | ORAL | 11 refills | Status: DC

## 2020-01-07 MED FILL — TAMSULOSIN HCL 0.4 MG CAP: 0.4 | 30 days supply | Qty: 30 | Fill #0

## 2020-01-07 NOTE — Progress Notes (Signed)
CC: Prostatitis follow up  HPI:  [name] is a 54 y.o. male with a past medical history stated below and presents today for prostatitis follow upp. Please see problem based assessment and plan for additional details.  Past Medical History:  Diagnosis Date  . Alcohol withdrawal ([place])   . Allergy   . Anxiety Dx 2014  . Chronic hip pain (Right) 03/23/2018  . Chronic low back pain (Secondary area of Pain) (Bilateral) (R>L) 12/28/2012  . Chronic lower extremity pain (Bilateral) (R>L) 06/26/2016  . Chronic musculoskeletal pain 04/15/2016  . Chronic radicular low back pain (Third area of Pain) (Right) (to calf) 06/02/2014  . Chronic sacroiliac joint pain (Bilateral) (R>L) 08/22/2016  . Chronic sacroiliac joint pain (Left) 03/23/2018  . Chronic shoulder pain (Right) 05/12/2014   [facility] in March 2016 on right shoulder. 06/22/2014: [facility] [place]:  Partial full-thickness tear with minimal retraction of supraspinatus. Status post injection and referral to PT   . COPD (chronic obstructive pulmonary disease) ([place])   . Drug abuse ([place])    pt reports opioid dependence due to previous back surgeries  . Dyspepsia   . Elevated LFTs 08/15/2018  . Enlarged prostate   . ETOH abuse   . GERD (gastroesophageal reflux disease) Dx 2003  . Headache(784.0)   . Hepatitis   . Hyperalgesia (Lower extremities) 12/26/2016  . Hyperlipidemia Dx 2000  . Hypertension Dx 2011  . Hypokalemia   . Hyponatremia 08/15/2018  . IBS (irritable bowel syndrome)    alternates constiaption/ diarrhea  . Irregular heart beat   . 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 Mental disorder   . Neuromuscular disorder ([place])    hands hurt   . Neuropathy 06/07/2013  . Opiate use 05/14/2016  . Pain management contract broken 12/15/2017   Pain medication contract broken on 12/15/2017 and again around 05/17/2019.  The patient continues to be noncompliant with the medication rules and regulations.  He also continues to consume alcohol and to use more  medication than prescribed.   . Seizure due to alcohol withdrawal ([place]) 01/23/2014   Per patient report  . Shortness of breath   . Withdrawal seizures ([place]) 2014   last seizure 2014 etoh with drawal    Current Outpatient Medications on File Prior to Visit  Medication Sig Dispense Refill  . albuterol (VENTOLIN HFA) 108 (90 Base) MCG/ACT inhaler Inhale 2 puffs into the lungs every 6 (six) hours as needed for wheezing or shortness of breath. 18 g 3  . buprenorphine-naloxone (SUBOXONE) 8-2 mg SUBL SL tablet Please take 2 and 1/2 tablet daily 35 tablet 1  . Cholecalciferol (VITAMIN D3 PO) Take 1 capsule by mouth daily.     Marland Kitchen dicyclomine (BENTYL) 10 MG capsule Take 2 capsules (20 mg total) by mouth every 8 (eight) hours as needed for spasms. 90 capsule 2  . gabapentin (NEURONTIN) 100 MG capsule Take 3 capsules (300 mg total) by mouth 3 (three) times daily. 270 capsule 5  . Magnesium Oxide 500 MG CAPS Take 1 capsule (500 mg total) by mouth daily. 30 capsule 11  . metFORMIN (GLUCOPHAGE) 500 MG tablet Take 1 tablet (500 mg total) by mouth daily with breakfast. 30 tablet 11  . pantoprazole (PROTONIX) 20 MG tablet Take 1 tablet (20 mg total) by mouth daily. 30 tablet 1  . rosuvastatin (CRESTOR) 20 MG tablet Take 1 tablet (20 mg total) by mouth daily. 90 tablet 3  . senna (SENOKOT) 8.6 MG TABS tablet Take 1 tablet (8.6  mg total) by mouth daily. 30 tablet 2   No current facility-administered medications on file prior to visit.    Family History  Problem Relation Age of Onset  . Lung cancer Father        was a smoker  . Alcohol abuse Father   . Hypertension Mother   . Colon polyps Mother   . Breast cancer Paternal Grandmother   . Alcohol abuse Paternal Grandfather   . Colon cancer Neg Hx   . Rectal cancer Neg Hx   . Stomach cancer Neg Hx     Social History   Socioeconomic History  . Marital status: Divorced    Spouse name: Not on file  . Number of children: 2  . Years of education: Not  on file  . Highest education level: Not on file  Occupational History  . Occupation: Unemployed  Tobacco Use  . Smoking status: Current Every Day Smoker    Packs/day: 0.50    Years: 30.00    Pack years: 15.00    Types: Cigarettes  . Smokeless tobacco: Never Used  . Tobacco comment: .5 PPD- 1ppd   Vaping Use  . Vaping Use: Never used  Substance and Sexual Activity  . Alcohol use: Yes    Alcohol/week: 56.0 standard drinks    Types: 56 Cans of beer per week    Comment: Case a beer a day   . Drug use: No    Comment: Opana  . Sexual activity: Not Currently  Other Topics Concern  . Not on file  Social History Narrative   Pt lives with mother.  Not unemployed due to chronic pain, but does not receive disability.  Not followed by an outpatient provider.   Social Determinants of Health   Financial Resource Strain:   . Difficulty of Paying Living Expenses: Not on file  Food Insecurity:   . Worried About Charity fundraiser in the Last Year: Not on file  . Ran Out of Food in the Last Year: Not on file  Transportation Needs:   . Lack of Transportation (Medical): Not on file  . Lack of Transportation (Non-Medical): Not on file  Physical Activity:   . Days of Exercise per Week: Not on file  . Minutes of Exercise per Session: Not on file  Stress:   . Feeling of Stress : Not on file  Social Connections:   . Frequency of Communication with Friends and Family: Not on file  . Frequency of Social Gatherings with Friends and Family: Not on file  . Attends Religious Services: Not on file  . Active Member of Clubs or Organizations: Not on file  . Attends Archivist Meetings: Not on file  . Marital Status: Not on file  Intimate Partner Violence:   . Fear of Current or Ex-Partner: Not on file  . Emotionally Abused: Not on file  . Physically Abused: Not on file  . Sexually Abused: Not on file    Review of Systems: ROS negative except for what is noted on the assessment and  plan.  Vitals:   01/07/20 1012  BP: 129/74  Pulse: 62  Temp: 98.4 F (36.9 C)  TempSrc: Oral  SpO2: 99%  Weight: 214 lb (97.1 kg)     Physical Exam: Physical Exam Constitutional:      Appearance: Normal appearance.  HENT:     Head: Normocephalic and atraumatic.  Eyes:     Extraocular Movements: Extraocular movements intact.  Cardiovascular:  Rate and Rhythm: Normal rate.     Pulses: Normal pulses.     Heart sounds: Normal heart sounds.  Pulmonary:     Effort: Pulmonary effort is normal.     Breath sounds: Normal breath sounds.  Abdominal:     General: Bowel sounds are normal.     Palpations: Abdomen is soft.     Tenderness: There is no abdominal tenderness.  Musculoskeletal:        General: Normal range of motion.     Cervical back: Normal range of motion.     Right lower leg: No edema.     Left lower leg: No edema.  Skin:    General: Skin is warm and dry.  Neurological:     Mental Status: He is alert and oriented to person, place, and time. Mental status is at baseline.  Psychiatric:        Mood and Affect: Mood normal.      Assessment & Plan:   See Encounters Tab for problem based charting.  Patient discussed with Dr. Karie Schwalbe, D.O. [facility], PGY-2 Pager: [phone], Phone: [phone] Date 01/09/2020 Time 10:15 AM

## 2020-01-07 NOTE — Patient Instructions (Signed)
Thank you, MarkMark [name] for allowing [place] to provide your care today. Today we discussed prostate enlargement, prediabetes, lower urinary tract symptoms.    I have ordered the following labs for you:   Lab Orders     PSA     CMP14 + Anion Gap     POC Hbg A1C   Tests ordered today:  none  Referrals ordered today:   Referral Orders  No referral(s) requested today     I have ordered the following medication/changed the following medications:   Stop the following medications: Medications Discontinued During This Encounter  Medication Reason   tamsulosin (FLOMAX) 0.4 MG CAPS capsule Reorder     Start the following medications: Meds ordered this encounter  Medications   tamsulosin (FLOMAX) 0.4 MG CAPS capsule    Sig: Take 1 capsule (0.4 mg total) by mouth daily.    Dispense:  90 capsule    Refill:  11    IM program     Follow up: 3 months    Remember:   Should you have any questions or concerns please call the internal medicine clinic at [phone].     Mark [name], D.O. [place]

## 2020-01-08 LAB — CMP14 + ANION GAP
ALT: 14 IU/L (ref 0–44)
AST: 15 IU/L (ref 0–40)
Albumin/Globulin Ratio: 2 (ref 1.2–2.2)
Albumin: 4.5 g/dL (ref 3.8–4.9)
Alkaline Phosphatase: 136 IU/L — ABNORMAL HIGH (ref 44–121)
Anion Gap: 15 mmol/L (ref 10.0–18.0)
BUN/Creatinine Ratio: 10 (ref 9–20)
BUN: 7 mg/dL (ref 6–24)
Bilirubin Total: 0.4 mg/dL (ref 0.0–1.2)
CO2: 25 mmol/L (ref 20–29)
Calcium: 9.8 mg/dL (ref 8.7–10.2)
Chloride: 98 mmol/L (ref 96–106)
Creatinine, Ser: 0.71 mg/dL — ABNORMAL LOW (ref 0.76–1.27)
GFR calc Af Amer: 123 mL/min/{1.73_m2} (ref >59)
GFR calc non Af Amer: 106 mL/min/{1.73_m2} (ref >59)
Globulin, Total: 2.3 g/dL (ref 1.5–4.5)
Glucose: 74 mg/dL (ref 65–99)
Potassium: 4.9 mmol/L (ref 3.5–5.2)
Sodium: 138 mmol/L (ref 134–144)
Total Protein: 6.8 g/dL (ref 6.0–8.5)

## 2020-01-08 LAB — PSA: Prostate Specific Ag, Serum: 1.4 ng/mL (ref 0.0–4.0)

## 2020-01-09 ENCOUNTER — Encounter: Payer: Self-pay | Admitting: Internal Medicine

## 2020-01-09 NOTE — Assessment & Plan Note (Signed)
Patient presents for reevaluation for prostatitis since completing a 14 day course of doxycycline. Patient states that his symptoms have improved and he no longer has pain with defecation.

## 2020-01-09 NOTE — Assessment & Plan Note (Signed)
Patient has a history of hyperlipidemia and was prescribed rosuvastatin 20 mg daily. Patient states that he has not been taking this medication as he ran out. He has a year supply of this medication at his pharamcy. He states that he will restart this medication today.   Plan: - Restart rosuvastatin 20 mg.

## 2020-01-09 NOTE — Assessment & Plan Note (Signed)
Patient has a history of prediabetes on metformin 500 mg daily. Last A1c was 6.2. He states that he has not been taking this medication either.   Plan: - Repeat BMP and A1c today - Restart metformin

## 2020-01-09 NOTE — Assessment & Plan Note (Signed)
Patient continue to have LUT symptoms concerning for BPH such has incomplete emptying with straining and hesitancy. He was previously on flomax but ran out of this prescription. He denies hematuria. He has not been evaluated by a urologist for many years.   Plan: - Repeat PSA today - Refill flomax

## 2020-01-10 NOTE — Progress Notes (Signed)
Internal Medicine Clinic Attending  Case discussed with Dr. Coe  At the time of the visit.  We reviewed the resident's history and exam and pertinent patient test results.  I agree with the assessment, diagnosis, and plan of care documented in the resident's note.  

## 2020-01-11 ENCOUNTER — Other Ambulatory Visit: Payer: Self-pay | Admitting: Internal Medicine

## 2020-01-11 ENCOUNTER — Other Ambulatory Visit: Payer: Self-pay

## 2020-01-11 ENCOUNTER — Ambulatory Visit (INDEPENDENT_AMBULATORY_CARE_PROVIDER_SITE_OTHER): Payer: Self-pay | Admitting: Internal Medicine

## 2020-01-11 ENCOUNTER — Encounter: Payer: Self-pay | Admitting: Internal Medicine

## 2020-01-11 VITALS — BP 136/97 | HR 69 | Temp 97.9°F | Ht 76.0 in | Wt 215.5 lb

## 2020-01-11 DIAGNOSIS — R7303 Prediabetes: Secondary | ICD-10-CM

## 2020-01-11 DIAGNOSIS — M792 Neuralgia and neuritis, unspecified: Secondary | ICD-10-CM

## 2020-01-11 DIAGNOSIS — F1120 Opioid dependence, uncomplicated: Secondary | ICD-10-CM

## 2020-01-11 MED ORDER — BUPRENORPHINE HCL-NALOXONE HCL 8-2 MG SL SUBL: SUBLINGUAL_TABLET | SUBLINGUAL | 1 refills | Status: DC

## 2020-01-11 NOTE — Progress Notes (Signed)
CC: OUD  HPI:  Mark [name] is a 54 y.o. with a PMHx listed below presenting for opioid use disorder.  For details of today's visit and the status of his chronic medical issues please refer to the assessment and plan.   Past Medical History:  Diagnosis Date  . Alcohol withdrawal ([place])   . Allergy   . Anxiety Dx 2014  . Chronic hip pain (Right) 03/23/2018  . Chronic low back pain (Secondary area of Pain) (Bilateral) (R>L) 12/28/2012  . Chronic lower extremity pain (Bilateral) (R>L) 06/26/2016  . Chronic musculoskeletal pain 04/15/2016  . Chronic radicular low back pain (Third area of Pain) (Right) (to calf) 06/02/2014  . Chronic sacroiliac joint pain (Bilateral) (R>L) 08/22/2016  . Chronic sacroiliac joint pain (Left) 03/23/2018  . Chronic shoulder pain (Right) 05/12/2014   [facility] in March 2016 on right shoulder. 06/22/2014: [facility] [place]:  Partial full-thickness tear with minimal retraction of supraspinatus. Status post injection and referral to PT   . COPD (chronic obstructive pulmonary disease) ([place])   . Drug abuse ([place])    pt reports opioid dependence due to previous back surgeries  . Dyspepsia   . Elevated LFTs 08/15/2018  . Enlarged prostate   . ETOH abuse   . GERD (gastroesophageal reflux disease) Dx 2003  . Headache(784.0)   . Hepatitis   . Hyperalgesia (Lower extremities) 12/26/2016  . Hyperlipidemia Dx 2000  . Hypertension Dx 2011  . Hypokalemia   . Hyponatremia 08/15/2018  . IBS (irritable bowel syndrome)    alternates constiaption/ diarrhea  . Irregular heart beat   . 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 Mental disorder   . Neuromuscular disorder ([place])    hands hurt   . Neuropathy 06/07/2013  . Opiate use 05/14/2016  . Pain management contract broken 12/15/2017   Pain medication contract broken on 12/15/2017 and again around 05/17/2019.  The patient continues to be noncompliant with the medication rules and regulations.  He also continues to consume alcohol and to use more  medication than prescribed.   . Seizure due to alcohol withdrawal ([place]) 01/23/2014   Per patient report  . Shortness of breath   . Withdrawal seizures ([place]) 2014   last seizure 2014 etoh with drawal   Review of Systems:   Review of Systems  Constitutional: Positive for malaise/fatigue. Negative for chills, fever and weight loss.  Respiratory: Negative for cough, sputum production, shortness of breath and wheezing.   Cardiovascular: Negative for chest pain, palpitations and leg swelling.  Gastrointestinal: Positive for abdominal pain. Negative for constipation, diarrhea, nausea and vomiting.  Genitourinary: Positive for dysuria. Negative for frequency, hematuria and urgency.  Musculoskeletal: Positive for back pain, joint pain and myalgias.  Neurological: Positive for tingling, sensory change and weakness. Negative for dizziness and headaches.    Physical Exam:  Vitals:   01/11/20 1042  BP: (!) 136/97  Pulse: 69  Temp: 97.9 F (36.6 C)  TempSrc: Oral  SpO2: 99%  Weight: 215 lb 8 oz (97.8 kg)  Height: 6\' 4"  (1.93 m)   Physical Exam Constitutional:      General: He is not in acute distress.    Appearance: Normal appearance. He is normal weight. He is not ill-appearing.  Cardiovascular:     Rate and Rhythm: Normal rate and regular rhythm.     Pulses: Normal pulses.     Heart sounds: Normal heart sounds. No murmur heard.  No friction rub. No gallop.   Pulmonary:  Effort: Pulmonary effort is normal. No respiratory distress.     Breath sounds: Normal breath sounds. No wheezing or rales.  Abdominal:     General: Abdomen is flat. Bowel sounds are normal. There is no distension.     Palpations: Abdomen is soft.     Tenderness: There is no abdominal tenderness. There is no guarding.  Musculoskeletal:        General: Tenderness present. No swelling.     Right lower leg: No edema.     Left lower leg: No edema.  Skin:    General: Skin is warm and dry.  Neurological:      General: No focal deficit present.     Mental Status: He is alert and oriented to person, place, and time. Mental status is at baseline.     Sensory: Sensory deficit present.     Motor: Weakness present.     Comments: Decreased sensation bilateral upper and lower extremities   Psychiatric:        Mood and Affect: Mood normal.        Behavior: Behavior normal.        Thought Content: Thought content normal.        Judgment: Judgment normal.     Assessment & Plan:   See Encounters Tab for problem based charting.   Patient discussed with Dr. Heber [place]

## 2020-01-11 NOTE — Assessment & Plan Note (Signed)
Presenting for f/u appt of OUD. He reports that due to his significant upper extremity neuropathy he dropped his suboxone medication container into the sink and was only able to save some of the tablets. He currently has only two pills left. He is worried that he may go in to withdrawal if he is not refilled prior to Monday. He states his aches and pains have been a little worse than before. His pain is mostly in his upper and lower extremities secondary to neuropathy. He states he has been drinking beer occasionally with his family but has not drank liquor in many years. He states his chronic pain is not well controlled but he is not interested in methadone. He has a history of multiple spinal fusion surgeries which contribute to a lot of his pain.   PDMP reviewed and appropriate.   Plan: - continue suboxone 2.5 tablets daily, refilled a few days early due to losing some tablets  - follow up in 4 weeks

## 2020-01-11 NOTE — Assessment & Plan Note (Signed)
Patient continues to have severe bilateral/symmetrical upper and lower extremity pain. States his upper extremity neuropathy has worsened. He denies heavy alcohol use but did state he still drinks beer occasionaly. His recent workup has been negative for vitamin deficiencies, thyroid disease, or worsening of his prediabetes. Ideally patient needs a referral to neurology however does not have insurance at this time. He is working on obtaining paperwork from the IRS so he can try to re-obtain an orange card through clinic. In the meantime discussed increasing his night time gabapentin dose to 300 mg, supplementing vit B1 and once he has insurance will refer him to neuro.  Plan: - increase nighttime gabapentin dose to 300 mg  - supplement vitamin B1 - neurology referral once insurance obtained

## 2020-01-11 NOTE — Patient Instructions (Addendum)
Mr. Faria,   It was a pleasure meeting you today. We discussed your neuropathy and opioid use disorder. We sent in a prescription for your suboxone for 11/3. If there are any issues please give [place] a call.  Regarding your neuropathy, I want you to try taking a vitamin B1 supplement. You can also increase your night time gabapentin dose to 300 mg and see if this helps at all. Once your insurance is approved I will put in a referral to neurology.  Plan to follow up in four weeks. Thanks for allowing [place] to be a part of your care!

## 2020-01-11 NOTE — Assessment & Plan Note (Signed)
Most recent Hgb A1c 5.7. Discussed how he is continuing to be in the prediabetic range.

## 2020-01-12 MED FILL — TAMSULOSIN HCL 0.4 MG CAP: 0.4 | 30 days supply | Qty: 30 | Fill #0

## 2020-01-12 MED FILL — BUPREN-NALOX SL TAB 8-2MG: 8-2 | 14 days supply | Qty: 35 | Fill #0

## 2020-01-14 NOTE — Progress Notes (Signed)
Internal Medicine Clinic Attending ° °Case discussed with Dr. Rehman  At the time of the visit.  We reviewed the resident’s history and exam and pertinent patient test results.  I agree with the assessment, diagnosis, and plan of care documented in the resident’s note.  ° °

## 2020-01-20 MED FILL — PANTOPRAZOLE SOD DR 20 MG T: 20 | 30 days supply | Qty: 30 | Fill #1

## 2020-01-20 MED FILL — DICYCLOMINE 10 MG CAPSULE: 10 | 15 days supply | Qty: 90 | Fill #2

## 2020-01-21 MED FILL — BACLOFEN 10 MG TABS: 10 | 30 days supply | Qty: 90 | Fill #4

## 2020-01-21 MED FILL — GABAPENTIN 100 MG CAPSULE: 100 | 30 days supply | Qty: 270 | Fill #2

## 2020-01-25 MED FILL — BUPREN-NALOX SL TAB 8-2MG: 8-2 | 14 days supply | Qty: 35 | Fill #1

## 2020-02-08 ENCOUNTER — Ambulatory Visit (INDEPENDENT_AMBULATORY_CARE_PROVIDER_SITE_OTHER): Payer: Self-pay | Admitting: Internal Medicine

## 2020-02-08 ENCOUNTER — Other Ambulatory Visit: Payer: Self-pay | Admitting: Internal Medicine

## 2020-02-08 VITALS — BP 161/87 | HR 78 | Wt 211.8 lb

## 2020-02-08 DIAGNOSIS — M792 Neuralgia and neuritis, unspecified: Secondary | ICD-10-CM

## 2020-02-08 DIAGNOSIS — I10 Essential (primary) hypertension: Secondary | ICD-10-CM

## 2020-02-08 DIAGNOSIS — F1120 Opioid dependence, uncomplicated: Secondary | ICD-10-CM

## 2020-02-08 MED ORDER — SENNA 8.6 MG PO TABS: 2.0000 | ORAL_TABLET | Freq: Every day | ORAL | 1 refills | Status: DC

## 2020-02-08 MED ORDER — GABAPENTIN 300 MG PO CAPS: 300.0000 mg | ORAL_CAPSULE | Freq: Three times a day (TID) | ORAL | 5 refills | Status: DC

## 2020-02-08 MED ORDER — BUPRENORPHINE HCL-NALOXONE HCL 8-2 MG SL SUBL: 1.0000 | SUBLINGUAL_TABLET | Freq: Three times a day (TID) | SUBLINGUAL | 1 refills | Status: DC

## 2020-02-08 MED FILL — BUPREN-NALOX SL TAB 8-2MG: 8-2 | 14 days supply | Qty: 42 | Fill #0

## 2020-02-08 NOTE — Patient Instructions (Signed)
Instruction for starting buprenorphine-naloxone (Suboxone) at home  You should not mix buprenorphine-naloxone with other drugs especially large amounts of alcohol or benzodiazepines (Valium, Klonopin, Xanax, Ativan). If you have taken any of these medication, please tell your healthcare team and do not take buprenorphine-naloxone.   You must wait until you are feeling signs of withdrawal from opiates (heroin, pain pills) before you take buprenorphine-naloxone.  If you do not wait long enough the medication will make you sicker.  If you do take it too soon and get sicker then wait until later when you feel signs of withdrawal listed below and then try again.   Signs that you are withdrawing: ? Anxiety, restlessness, can't sit still ? Aches ? Nausea or sick to your stomach ? Goose-bumps ? Racing heart   You should have ALL of these symptoms before you start taking your first dose of buprenorphine-naloxone. If you are not sure call your healthcare team.    When it's time to take your first dose 1. Split your pill or film in half 2. Make sure your mouth is empty of everything (no candy/gum/etc) 3. Sit or stand, but do not lie down 4. Swallow a sip of water to wet your mouth  5. Put the half of the tablet or film under your tongue. Do not suck or swallow it. It must stay there until it is completely dissolved. Try to not even swallow your spit during this time. Anything that you swallow will not make you feel better.   In 20 minutes: You should start feeling a little better. If you feel worse then you started too early so you would wait a few hours and then try again later.    In one hour: You can take the other half of the pill or film the same way you took the first one.   In 2 hours: if you are still feeling symptoms of withdrawal listed above you can take another half a pill or film. You can repeat this if needed until you take a total of 2 pills or 2 films (16mg). You may need less than  this to control your symptoms.  You should adjust your dose so that you are taking one and half or two pills or films per day (12-16mg per day). At this dose you should have cut down on cravings and help with any withdrawal symptoms.   The next day:  In the morning you can take the same amount you took yesterday all at one time in the morning.  Expect a call from your team to see how you are doing.   If you have any questions or concerns at any time call your healthcare team.  Clinic Number:  830am - 5pm: [phone] After Hours Number: [phone] - - Leave your number and expect a call back from a physician.  

## 2020-02-08 NOTE — Progress Notes (Signed)
   02/08/2020  Mark [name] presents for follow up of opioid use disorder I have reviewed the prior induction visit, follow up visits, and telephone encounters relevant to opiate use disorder (OUD) treatment.   Current daily dose: Suboxone 8-2 mg, 2.5 tablets per day   Date of Induction: 05/21/19  Current follow up interval, in weeks: 4  The patient has been adherent with the buprenorphine for OUD contract.   Last UDS Result: 12/14/2019 appropriate for buprenorphine and metabolite   HPI: This is a 54 year old with a history of OUD who presented for follow up of his OUD.  He/She reports he has been adherent to Suboxone therapy but does not control his cravings entirely. We discussed increasing his daily dose to help with cravings. Denies any relapse and feels he/she is doing well on Suboxone therapy.   Exam:   Vitals:   02/08/20 0927  BP: (!) 161/87  Pulse: 78  SpO2: 100%  Weight: 211 lb 12.8 oz (96.1 kg)    General: NAD, nl appearance Cardiovascular: Normal rate, regular rhythm.  No murmurs, rubs, or gallops Pulmonary : Effort normal, breath sounds normal. No wheezes, rales, or rhonchi Abdominal: soft, nontender,  bowel sounds present Psychiatric/Behavioral:  normal mood, normal behavior    Assessment/Plan:  See Problem Based Charting in the Encounters Tab     Madalyn Rob, MD  02/08/2020  9:32 AM

## 2020-02-09 ENCOUNTER — Encounter: Payer: Self-pay | Admitting: Internal Medicine

## 2020-02-09 NOTE — Assessment & Plan Note (Signed)
Patient on Gabapentin 300 mg TID and prescribed 100 mg tablets. Refilled prescription and changed to 300 mg tablets for convenience.

## 2020-02-09 NOTE — Assessment & Plan Note (Signed)
Blood Pressure 02/08/2020 01/11/2020 01/07/2020 12/14/2019 12/08/2019  BP 161/87 136/97 129/74 130/53 132/73   BP elevation noted at visit today. Given BP controlled at previous visits we will not start BP medication today.

## 2020-02-09 NOTE — Assessment & Plan Note (Addendum)
Assessment: Opioid use disorder, moderate, dependence  Patient reports he has chronic pain. In addition he feels he has cravings most of the time. Patient currently on 2.5 tabs, we will increased to the max suggested dose for OUD today, 3 tabs. In addition he is having a bowel movement every 3 days, and normal for him is every day. He is currently on Senna 1 tablet daily.  Plan: - ToxAssure Select,+Antidepr,UR - senna (SENOKOT) 8.6 MG TABS tablet; Take 2 tablets (17.2 mg total) by mouth daily.  Dispense: 180 tablet; Refill: 1 - buprenorphine-naloxone (SUBOXONE) 8-2 mg SUBL SL tablet; Place 1 tablet under the tongue in the morning, at noon, and at bedtime.  Dispense: 42 tablet; Refill: 1

## 2020-02-11 NOTE — Progress Notes (Signed)
Internal Medicine Clinic Attending  I saw and evaluated the patient.  I personally confirmed the key portions of the history and exam documented by Dr. Steen and I reviewed pertinent patient test results.  The assessment, diagnosis, and plan were formulated together and I agree with the documentation in the resident's note.     

## 2020-02-14 LAB — TOXASSURE SELECT,+ANTIDEPR,UR

## 2020-02-19 ENCOUNTER — Other Ambulatory Visit: Payer: Self-pay | Admitting: Internal Medicine

## 2020-02-19 ENCOUNTER — Other Ambulatory Visit: Payer: Self-pay | Admitting: Pain Medicine

## 2020-02-19 DIAGNOSIS — G8929 Other chronic pain: Secondary | ICD-10-CM

## 2020-02-19 DIAGNOSIS — M62830 Muscle spasm of back: Secondary | ICD-10-CM

## 2020-02-19 DIAGNOSIS — R109 Unspecified abdominal pain: Secondary | ICD-10-CM

## 2020-02-21 ENCOUNTER — Other Ambulatory Visit: Payer: Self-pay | Admitting: Internal Medicine

## 2020-02-21 MED FILL — BUPREN-NALOX SL TAB 8-2MG: 8-2 | 14 days supply | Qty: 42 | Fill #1

## 2020-02-22 MED FILL — DICYCLOMINE 10 MG CAPSULE: 10 | 15 days supply | Qty: 90 | Fill #0

## 2020-03-06 ENCOUNTER — Encounter: Payer: Self-pay | Admitting: Student

## 2020-03-06 ENCOUNTER — Other Ambulatory Visit: Payer: Self-pay | Admitting: Internal Medicine

## 2020-03-06 ENCOUNTER — Ambulatory Visit (INDEPENDENT_AMBULATORY_CARE_PROVIDER_SITE_OTHER): Payer: Self-pay | Admitting: Student

## 2020-03-06 DIAGNOSIS — Z0000 Encounter for general adult medical examination without abnormal findings: Secondary | ICD-10-CM

## 2020-03-06 DIAGNOSIS — G479 Sleep disorder, unspecified: Secondary | ICD-10-CM

## 2020-03-06 DIAGNOSIS — F1120 Opioid dependence, uncomplicated: Secondary | ICD-10-CM

## 2020-03-06 DIAGNOSIS — I10 Essential (primary) hypertension: Secondary | ICD-10-CM

## 2020-03-06 MED ORDER — BUPRENORPHINE HCL-NALOXONE HCL 8-2 MG SL SUBL: 1.0000 | SUBLINGUAL_TABLET | Freq: Three times a day (TID) | SUBLINGUAL | 1 refills | Status: DC

## 2020-03-06 MED FILL — BUPREN-NALOX SL TAB 8-2MG: 8-2 | 14 days supply | Qty: 42 | Fill #0

## 2020-03-06 NOTE — Assessment & Plan Note (Signed)
Assessment: BP Readings from Last 3 Encounters:  03/06/20 (!) 143/86  02/08/20 (!) 161/87  01/11/20 (!) 136/97   Patient with elevated BP last three visits. He notes that he is on metoprolol 25mg  BID by his cardiologist. He would like to discuss starting another BP medication with his cardiologist.   Plan: - Add metoprolol to patient's med list - Reassess in one month at patient's monthly suboxone visits

## 2020-03-06 NOTE — Assessment & Plan Note (Signed)
Assessment: Patient endorses waking up every hour to hour and a half. He denies problems falling asleep. He endorses this has been going on for years. He states he was supposed to have a sleep study done in the past but never followed up and now that he does not have insurance he would like to wait to have this done. From chart review it appears patient was having difficulty staying asleep, was snoring throughout the night, and woke up with headaches.   Reviewed sleep hygiene with the patient, he denies having electronics in the bedroom or using electronic devices before bed.   He notes trying melatonin in the past as well as trazodone without success   Plan: - Patient would like to wait to have sleep study ordered once he has insurance

## 2020-03-06 NOTE — Patient Instructions (Signed)
Thank you, Mark [name] for allowing [place] to provide your care today. Today we discussed:  1) Opioid Use Disorder - I am glad to hear your pain has decreased since we increased the suboxone. If you feel as though you are having any increase in cravings or you are close to relapsing, please call our office! We have sent your new prescription to the [facility]  2) Blood pressure - Your blood pressure was elevated today, however, you would like to discuss this further with your heart doctor.     I have ordered the following labs for you:  Lab Orders  No laboratory test(s) ordered today     Tests ordered today:  None  Referrals ordered today:   Referral Orders  No referral(s) requested today     I have ordered the following medication/changed the following medications:   Stop the following medications: There are no discontinued medications.   Start the following medications: No orders of the defined types were placed in this encounter.    Follow up: 1 month    Remember: Call us if you feel as though you are going to relapse!  Should you have any questions or concerns please call the internal medicine clinic at [phone].     Sanjuana Letters, D.O. [place]

## 2020-03-06 NOTE — Assessment & Plan Note (Signed)
Patient states he is currently uninsured and has applied for disability twice in the past for his chronic pain from his prior cervical spine surgeries. He notes he has been on the orange card in the past, but is not being sent the proper paperwork from the IRS. He states he will continue to work on applying for disability and the orange card to help with his care.

## 2020-03-06 NOTE — Progress Notes (Signed)
CC: Opioid Use Disorder  HPI:  [name] is a 54 y.o. male with a past medical history stated below and presents today for opioid use disorder, sleep difficulties. Please see problem based assessment and plan for additional details.  Past Medical History:  Diagnosis Date  . Alcohol withdrawal ([place])   . Allergy   . Anxiety Dx 2014  . Chronic hip pain (Right) 03/23/2018  . Chronic low back pain (Secondary area of Pain) (Bilateral) (R>L) 12/28/2012  . Chronic lower extremity pain (Bilateral) (R>L) 06/26/2016  . Chronic musculoskeletal pain 04/15/2016  . Chronic radicular low back pain (Third area of Pain) (Right) (to calf) 06/02/2014  . Chronic sacroiliac joint pain (Bilateral) (R>L) 08/22/2016  . Chronic sacroiliac joint pain (Left) 03/23/2018  . Chronic shoulder pain (Right) 05/12/2014   [place] in March 2016 on right shoulder. 06/22/2014: [facility] [place]:  Partial full-thickness tear with minimal retraction of supraspinatus. Status post injection and referral to PT   . COPD (chronic obstructive pulmonary disease) ([place])   . Drug abuse ([place])    pt reports opioid dependence due to previous back surgeries  . Dyspepsia   . Elevated LFTs 08/15/2018  . Enlarged prostate   . ETOH abuse   . GERD (gastroesophageal reflux disease) Dx 2003  . Headache(784.0)   . Hepatitis   . Hyperalgesia (Lower extremities) 12/26/2016  . Hyperlipidemia Dx 2000  . Hypertension Dx 2011  . Hypokalemia   . Hyponatremia 08/15/2018  . IBS (irritable bowel syndrome)    alternates constiaption/ diarrhea  . Irregular heart beat   . 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 Mental disorder   . Neuromuscular disorder ([place])    hands hurt   . Neuropathy 06/07/2013  . Opiate use 05/14/2016  . Pain management contract broken 12/15/2017   Pain medication contract broken on 12/15/2017 and again around 05/17/2019.  The patient continues to be noncompliant with the medication rules and regulations.  He also continues to consume alcohol and to  use more medication than prescribed.   . Seizure due to alcohol withdrawal ([place]) 01/23/2014   Per patient report  . Shortness of breath   . Withdrawal seizures ([place]) 2014   last seizure 2014 etoh with drawal    Current Outpatient Medications on File Prior to Visit  Medication Sig Dispense Refill  . albuterol (VENTOLIN HFA) 108 (90 Base) MCG/ACT inhaler Inhale 2 puffs into the lungs every 6 (six) hours as needed for wheezing or shortness of breath. 18 g 3  . Cholecalciferol (VITAMIN D3 PO) Take 1 capsule by mouth daily.     Marland Kitchen dicyclomine (BENTYL) 10 MG capsule TAKE 2 CAPSULES BY MOUTH EVERY 8 HOURS AS NEEDED FOR SPASMS 90 capsule 2  . gabapentin (NEURONTIN) 300 MG capsule Take 1 capsule (300 mg total) by mouth 3 (three) times daily. 90 capsule 5  . Magnesium Oxide 500 MG CAPS Take 1 capsule (500 mg total) by mouth daily. 30 capsule 11  . metFORMIN (GLUCOPHAGE) 500 MG tablet Take 1 tablet (500 mg total) by mouth daily with breakfast. 30 tablet 11  . pantoprazole (PROTONIX) 20 MG tablet Take 1 tablet (20 mg total) by mouth daily. 30 tablet 1  . rosuvastatin (CRESTOR) 20 MG tablet Take 1 tablet (20 mg total) by mouth daily. 90 tablet 3  . senna (SENOKOT) 8.6 MG TABS tablet Take 2 tablets (17.2 mg total) by mouth daily. 180 tablet 1  . tamsulosin (FLOMAX) 0.4 MG CAPS capsule Take 1 capsule (0.4 mg  total) by mouth daily. 90 capsule 11   No current facility-administered medications on file prior to visit.    Family History  Problem Relation Age of Onset  . Lung cancer Father        was a smoker  . Alcohol abuse Father   . Hypertension Mother   . Colon polyps Mother   . Breast cancer Paternal Grandmother   . Alcohol abuse Paternal Grandfather   . Colon cancer Neg Hx   . Rectal cancer Neg Hx   . Stomach cancer Neg Hx     Social History   Socioeconomic History  . Marital status: Divorced    Spouse name: Not on file  . Number of children: 2  . Years of education: Not on file  .  Highest education level: Not on file  Occupational History  . Occupation: Unemployed  Tobacco Use  . Smoking status: Current Every Day Smoker    Packs/day: 0.50    Years: 30.00    Pack years: 15.00    Types: Cigarettes  . Smokeless tobacco: Never Used  . Tobacco comment: .5 PPD- 1ppd   Vaping Use  . Vaping Use: Never used  Substance and Sexual Activity  . Alcohol use: Yes    Comment: Drinks beer.  . Drug use: No    Comment: Opana  . Sexual activity: Not Currently  Other Topics Concern  . Not on file  Social History Narrative   Pt lives with mother.  Not unemployed due to chronic pain, but does not receive disability.  Not followed by an outpatient provider.   Social Determinants of Health   Financial Resource Strain: Not on file  Food Insecurity: Not on file  Transportation Needs: Not on file  Physical Activity: Not on file  Stress: Not on file  Social Connections: Not on file  Intimate Partner Violence: Not on file    Review of Systems: ROS negative except for what is noted on the assessment and plan.  Vitals:   03/06/20 0934  BP: (!) 143/86  Pulse: 69  Temp: 98 F (36.7 C)  TempSrc: Oral  SpO2: 100%  Weight: 213 lb (96.6 kg)     Physical Exam: Physical Exam Vitals and nursing note reviewed.  Constitutional:      General: He is not in acute distress.    Appearance: Normal appearance. He is not ill-appearing, toxic-appearing or diaphoretic.  HENT:     Head: Normocephalic and atraumatic.  Cardiovascular:     Rate and Rhythm: Normal rate and regular rhythm.     Pulses: Normal pulses.     Heart sounds: No murmur heard. No friction rub. No gallop.   Pulmonary:     Effort: Pulmonary effort is normal. No respiratory distress.     Breath sounds: Normal breath sounds. No stridor. No wheezing, rhonchi or rales.  Abdominal:     General: Abdomen is flat. There is no distension.  Musculoskeletal:     Right lower leg: No edema.     Left lower leg: No edema.   Skin:    General: Skin is warm and dry.  Neurological:     General: No focal deficit present.     Mental Status: He is alert and oriented to person, place, and time. Mental status is at baseline.  Psychiatric:        Mood and Affect: Mood normal.        Behavior: Behavior normal.        Thought Content: Thought  content normal.      Assessment & Plan:   See Encounters Tab for problem based charting.  Patient seen with Dr. Girtha Rm, D.O. [facility], PGY-1 Pager: [phone], Phone: [phone] Date 03/06/2020 Time 11:27 AM

## 2020-03-06 NOTE — Assessment & Plan Note (Addendum)
Assessment: He continues to endorse chronic pain that this improved since his last visit where he was increased to 3 tabs daily of his suboxone 8-2 mg tabs. He denies increase in cravings or relapsing since last visit. Previous Toxassure was negative.  Patient has had BM daily since increase senna to 2 tabs daily and taking miralax as well. Patient instructed to call clinic if feel as though he will relapse prior to next visit.   Plan: - continue current regimen of suboxone 8-2 mg 3 tab daily, refilled by Dr. Criselda Peaches. PDMP reviewed - continue senna/miralax for constipation - follow up one month for suboxone refill and to reassess status

## 2020-03-07 NOTE — Progress Notes (Signed)
Internal Medicine Clinic Attending  I saw and evaluated the patient.  I personally confirmed the key portions of the history and exam documented by Dr. Evie Lacks and I reviewed pertinent patient test results.  The assessment, diagnosis, and plan were formulated together and I agree with the documentation in the resident's note.   Patient is doing well on suboxone.  Does not report any relapses or cravings.  He will continue on his current dose.  Consider UDS at next visit.  PDMP reviewed and appropriate.

## 2020-03-13 ENCOUNTER — Encounter: Payer: Self-pay | Admitting: Internal Medicine

## 2020-03-13 ENCOUNTER — Telehealth: Payer: Self-pay | Admitting: *Deleted

## 2020-03-13 NOTE — Telephone Encounter (Signed)
SPOKE WITH PATIENT REGARDING HIS MISSED APPOINTMENT. PATIENT STATES HE TRIED CALLING UNABLE TO GET THROUGH. PATIENT WAS ADVISED TO CALL THE [phone] TO RESCHEDULE APPOINTMENT WITH THE CLINIC.

## 2020-03-20 MED FILL — BUPREN-NALOX SL TAB 8-2MG: 8-2 | 14 days supply | Qty: 42 | Fill #1

## 2020-04-04 ENCOUNTER — Other Ambulatory Visit: Payer: Self-pay

## 2020-04-04 ENCOUNTER — Other Ambulatory Visit: Payer: Self-pay | Admitting: Internal Medicine

## 2020-04-04 ENCOUNTER — Ambulatory Visit (INDEPENDENT_AMBULATORY_CARE_PROVIDER_SITE_OTHER): Payer: Self-pay | Admitting: Internal Medicine

## 2020-04-04 VITALS — BP 159/84 | HR 72 | Wt 213.3 lb

## 2020-04-04 DIAGNOSIS — R109 Unspecified abdominal pain: Secondary | ICD-10-CM

## 2020-04-04 DIAGNOSIS — F1120 Opioid dependence, uncomplicated: Secondary | ICD-10-CM

## 2020-04-04 DIAGNOSIS — R7303 Prediabetes: Secondary | ICD-10-CM

## 2020-04-04 DIAGNOSIS — M792 Neuralgia and neuritis, unspecified: Secondary | ICD-10-CM

## 2020-04-04 DIAGNOSIS — I10 Essential (primary) hypertension: Secondary | ICD-10-CM

## 2020-04-04 MED ORDER — DICYCLOMINE HCL 10 MG PO CAPS
ORAL_CAPSULE | ORAL | 2 refills | Status: DC
Start: 2020-04-04 — End: 2020-04-04

## 2020-04-04 MED ORDER — GABAPENTIN 300 MG PO CAPS: 300.0000 mg | ORAL_CAPSULE | Freq: Three times a day (TID) | ORAL | 5 refills | Status: DC

## 2020-04-04 MED ORDER — BUPRENORPHINE HCL-NALOXONE HCL 8-2 MG SL SUBL: 1.0000 | SUBLINGUAL_TABLET | Freq: Three times a day (TID) | SUBLINGUAL | 1 refills | Status: DC

## 2020-04-04 MED ORDER — METOPROLOL TARTRATE 25 MG PO TABS: 25.0000 mg | ORAL_TABLET | Freq: Two times a day (BID) | ORAL | 2 refills | Status: DC

## 2020-04-04 MED FILL — METOPROLOL TARTRATE 25 MG T: 25 | 30 days supply | Qty: 60 | Fill #0

## 2020-04-04 MED FILL — GABAPENTIN 300 MG CAPSULE: 300 | 30 days supply | Qty: 90 | Fill #0

## 2020-04-04 MED FILL — BUPREN-NALOX SL TAB 8-2MG: 8-2 | 14 days supply | Qty: 42 | Fill #0

## 2020-04-04 MED FILL — DICYCLOMINE 10 MG CAPSULE: 10 | 15 days supply | Qty: 90 | Fill #0

## 2020-04-04 NOTE — Progress Notes (Unsigned)
   04/05/2020  Mark [name] presents for follow up of opioid use disorder I have reviewed the prior induction visit, follow up visits, and telephone encounters relevant to opiate use disorder (OUD) treatment.   Current daily dose: Suboxone 8-2 mg, 2.5 tablets daily  Date of Induction: 05/21/2019  Current follow up interval, in weeks: 4 weeks  The patient has been adherent with the buprenorphine for OUD contract.   Last UDS Result: 01/2020  HPI: This is a 55 y.o. old male living with OUD who presents for a follow up appointment for OUD. He admits to adherence to his medication as prescribed. He denies cravings and highlights the following triggers: chronic pain. He denies any relapses since his last appointment.    Exam:   Vitals:   04/04/20 0933  BP: (!) 159/84  Pulse: 72  SpO2: 99%  Weight: 213 lb 4.8 oz (96.8 kg)   Physical Exam Constitutional:      Appearance: Normal appearance.  HENT:     Head: Normocephalic and atraumatic.  Eyes:     Extraocular Movements: Extraocular movements intact.  Cardiovascular:     Rate and Rhythm: Normal rate and regular rhythm.  Pulmonary:     Effort: Pulmonary effort is normal.     Breath sounds: Normal breath sounds.  Abdominal:     General: There is no distension.     Palpations: Abdomen is soft.     Tenderness: There is no abdominal tenderness.  Musculoskeletal:        General: Normal range of motion.  Skin:    General: Skin is warm and dry.  Neurological:     General: No focal deficit present.     Mental Status: He is alert and oriented to person, place, and time. Mental status is at baseline.  Psychiatric:        Mood and Affect: Mood normal.     Assessment/Plan:  See Problem Based Charting in the Encounters Tab   Mark [name], D.O.  Internal Medicine Resident, PGY-2 [name]  Pager: [phone] 9:47 AM, 04/05/2020

## 2020-04-04 NOTE — Patient Instructions (Signed)
Thank you, [name] for allowing [place] to provide your care today. Today we discussed opioid use disorder.    I have ordered the following labs for you:  Lab Orders  No laboratory test(s) ordered today     Tests ordered today:  None  Referrals ordered today:   Referral Orders  No referral(s) requested today     I have ordered the following medication/changed the following medications:   Stop the following medications: None  Start the following medications: Meds ordered this encounter  Medications  . metoprolol tartrate (LOPRESSOR) 25 MG tablet    Sig: Take 1 tablet (25 mg total) by mouth 2 (two) times daily.    Dispense:  60 tablet    Refill:  2  . gabapentin (NEURONTIN) 300 MG capsule    Sig: Take 1 capsule (300 mg total) by mouth 3 (three) times daily.    Dispense:  90 capsule    Refill:  5  . dicyclomine (BENTYL) 10 MG capsule    Sig: TAKE 2 CAPSULES BY MOUTH EVERY 8 HOURS AS NEEDED FOR SPASMS    Dispense:  90 capsule    Refill:  2     We will refill your Suboxone medication today.  Follow up: 4 weeks in office   Remember: please make a 1 week appointment to talk about your other medical conditions.   Should you have any questions or concerns please call the internal medicine clinic at [phone].     Mark [name], D.O. [place]

## 2020-04-05 ENCOUNTER — Ambulatory Visit (INDEPENDENT_AMBULATORY_CARE_PROVIDER_SITE_OTHER): Payer: Self-pay | Admitting: Internal Medicine

## 2020-04-05 ENCOUNTER — Other Ambulatory Visit: Payer: Self-pay

## 2020-04-05 ENCOUNTER — Encounter: Payer: Self-pay | Admitting: Internal Medicine

## 2020-04-05 ENCOUNTER — Other Ambulatory Visit: Payer: Self-pay | Admitting: Internal Medicine

## 2020-04-05 ENCOUNTER — Telehealth: Payer: Self-pay

## 2020-04-05 VITALS — BP 153/84 | HR 77 | Temp 98.1°F | Ht 76.0 in | Wt 216.5 lb

## 2020-04-05 DIAGNOSIS — K7030 Alcoholic cirrhosis of liver without ascites: Secondary | ICD-10-CM

## 2020-04-05 DIAGNOSIS — E782 Mixed hyperlipidemia: Secondary | ICD-10-CM

## 2020-04-05 DIAGNOSIS — I10 Essential (primary) hypertension: Secondary | ICD-10-CM

## 2020-04-05 DIAGNOSIS — M792 Neuralgia and neuritis, unspecified: Secondary | ICD-10-CM

## 2020-04-05 DIAGNOSIS — G894 Chronic pain syndrome: Secondary | ICD-10-CM

## 2020-04-05 LAB — PROTIME-INR
INR: 0.9 (ref 0.8–1.2)
Prothrombin Time: 12.2 seconds (ref 11.4–15.2)

## 2020-04-05 MED ORDER — HYDROCHLOROTHIAZIDE 12.5 MG PO TABS: 12.5000 mg | ORAL_TABLET | Freq: Every day | ORAL | 2 refills | Status: DC

## 2020-04-05 MED ORDER — DICLOFENAC SODIUM 1 % EX GEL
4.0000 g | Freq: Four times a day (QID) | CUTANEOUS | 2 refills | Status: DC
Start: 2020-04-05 — End: 2020-04-05

## 2020-04-05 MED ORDER — LIDOCAINE HCL 3 % EX GEL: 7.5000 mg | Freq: Three times a day (TID) | CUTANEOUS | 3 refills | Status: DC

## 2020-04-05 MED FILL — DICLOFENAC SODIUM 1 % GEL: 1 | 25 days supply | Qty: 400 | Fill #0

## 2020-04-05 MED FILL — HYDROCHLOROTHIAZIDE 12.5 MG: 12.5 | 30 days supply | Qty: 30 | Fill #0

## 2020-04-05 NOTE — Progress Notes (Signed)
CC: Chronic Pain  HPI:  Mark [name] is a 55 y.o. male with a past medical history stated below and presents today for Chronic pain. Please see problem based assessment and plan for additional details.  Past Medical History:  Diagnosis Date  . Alcohol withdrawal ([place])   . Allergy   . Anxiety Dx 2014  . Chronic hip pain (Right) 03/23/2018  . Chronic low back pain (Secondary area of Pain) (Bilateral) (R>L) 12/28/2012  . Chronic lower extremity pain (Bilateral) (R>L) 06/26/2016  . Chronic musculoskeletal pain 04/15/2016  . Chronic radicular low back pain (Third area of Pain) (Right) (to calf) 06/02/2014  . Chronic sacroiliac joint pain (Bilateral) (R>L) 08/22/2016  . Chronic sacroiliac joint pain (Left) 03/23/2018  . Chronic shoulder pain (Right) 05/12/2014   [place] [facility] in March 2016 on right shoulder. 06/22/2014: [facility] [place]:  Partial full-thickness tear with minimal retraction of supraspinatus. Status post injection and referral to PT   . COPD (chronic obstructive pulmonary disease) ([place])   . Drug abuse ([place])    pt reports opioid dependence due to previous back surgeries  . Dyspepsia   . Elevated LFTs 08/15/2018  . Enlarged prostate   . ETOH abuse   . GERD (gastroesophageal reflux disease) Dx 2003  . Headache(784.0)   . Hepatitis   . Hyperalgesia (Lower extremities) 12/26/2016  . Hyperlipidemia Dx 2000  . Hypertension Dx 2011  . Hypokalemia   . Hyponatremia 08/15/2018  . IBS (irritable bowel syndrome)    alternates constiaption/ diarrhea  . Irregular heart beat   . 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 Mental disorder   . Neuromuscular disorder ([place])    hands hurt   . Neuropathy 06/07/2013  . Opiate use 05/14/2016  . Pain management contract broken 12/15/2017   Pain medication contract broken on 12/15/2017 and again around 05/17/2019.  The patient continues to be noncompliant with the medication rules and regulations.  He also continues to consume alcohol and to use more medication than  prescribed.   . Seizure due to alcohol withdrawal ([place]) 01/23/2014   Per patient report  . Shortness of breath   . Withdrawal seizures ([place]) 2014   last seizure 2014 etoh with drawal    Current Outpatient Medications on File Prior to Visit  Medication Sig Dispense Refill  . buprenorphine-naloxone (SUBOXONE) 8-2 mg SUBL SL tablet Place 1 tablet under the tongue in the morning, at noon, and at bedtime. 42 tablet 1  . dicyclomine (BENTYL) 10 MG capsule TAKE 2 CAPSULES BY MOUTH EVERY 8 HOURS AS NEEDED FOR SPASMS 90 capsule 2  . gabapentin (NEURONTIN) 300 MG capsule Take 1 capsule (300 mg total) by mouth 3 (three) times daily. 90 capsule 5  . metoprolol tartrate (LOPRESSOR) 25 MG tablet Take 1 tablet (25 mg total) by mouth 2 (two) times daily. 60 tablet 2  . tamsulosin (FLOMAX) 0.4 MG CAPS capsule Take 1 capsule (0.4 mg total) by mouth daily. 90 capsule 11   No current facility-administered medications on file prior to visit.    Family History  Problem Relation Age of Onset  . Lung cancer Father        was a smoker  . Alcohol abuse Father   . Hypertension Mother   . Colon polyps Mother   . Breast cancer Paternal Grandmother   . Alcohol abuse Paternal Grandfather   . Colon cancer Neg Hx   . Rectal cancer Neg Hx   . Stomach cancer Neg Hx  Social History   Socioeconomic History  . Marital status: Divorced    Spouse name: Not on file  . Number of children: 2  . Years of education: Not on file  . Highest education level: Not on file  Occupational History  . Occupation: Unemployed  Tobacco Use  . Smoking status: Current Every Day Smoker    Packs/day: 0.75    Years: 30.00    Pack years: 22.50    Types: Cigarettes  . Smokeless tobacco: Never Used  . Tobacco comment: .5 PPD- 1ppd   Vaping Use  . Vaping Use: Never used  Substance and Sexual Activity  . Alcohol use: Yes    Comment: Drinks beer.  . Drug use: No    Comment: Opana  . Sexual activity: Not Currently  Other  Topics Concern  . Not on file  Social History Narrative   Pt lives with mother.  Not unemployed due to chronic pain, but does not receive disability.  Not followed by an outpatient provider.   Social Determinants of Health   Financial Resource Strain: Not on file  Food Insecurity: Not on file  Transportation Needs: Not on file  Physical Activity: Not on file  Stress: Not on file  Social Connections: Not on file  Intimate Partner Violence: Not on file    Review of Systems: ROS negative except for what is noted on the assessment and plan.  Vitals:   04/05/20 1425  BP: (!) 153/84  Pulse: 77  Temp: 98.1 F (36.7 C)  TempSrc: Oral  SpO2: 99%  Weight: 216 lb 8 oz (98.2 kg)  Height: 6\' 4"  (1.93 m)     Physical Exam: Physical Exam Constitutional:      Appearance: Normal appearance.  HENT:     Head: Normocephalic and atraumatic.  Eyes:     Extraocular Movements: Extraocular movements intact.  Cardiovascular:     Rate and Rhythm: Normal rate.     Pulses: Normal pulses.     Heart sounds: Normal heart sounds.  Pulmonary:     Effort: Pulmonary effort is normal.     Breath sounds: Normal breath sounds.  Abdominal:     General: Bowel sounds are normal. There is no distension.     Palpations: Abdomen is soft.     Tenderness: There is no abdominal tenderness.  Musculoskeletal:        General: Tenderness (multiple joint tenderness with movement) present. Normal range of motion.     Cervical back: Normal range of motion.     Right lower leg: No edema.     Left lower leg: No edema.  Skin:    General: Skin is warm and dry.  Neurological:     Mental Status: He is alert and oriented to person, place, and time. Mental status is at baseline.  Psychiatric:        Mood and Affect: Mood normal.      Assessment & Plan:   See Encounters Tab for problem based charting.  Patient discussed with Dr. Kelle Darting, D.O. [facility], PGY-2 Pager:  [phone], Phone: [phone] Date 04/06/2020 Time 10:44 AM

## 2020-04-05 NOTE — Assessment & Plan Note (Signed)
Patient presents today for follow-up appointment for EGD.  Patient's last appointment was on 03/06/2020.  Patient states that he is doing well on Suboxone 8-2 mg 2.5 tablets daily.  He denies any relapses or cravings, but states that he thinks about pain medicine a lot due to his ongoing chronic pain.  States that he is living with his mother and granddaughter and enjoys her company.  He states that they provide great support for him.  His last UDS was on 11/30 was appropriate.  BMP reviewed today and is appropriate.  Patient would like to schedule follow-up appointment to discuss his ongoing medical issues.  Plan: 1.  Refill Suboxone 8-2 mg 2.5 tablets daily. 2.  UDS at his 1 month follow-up.

## 2020-04-05 NOTE — Progress Notes (Signed)
Internal Medicine Clinic Attending  Case discussed with Dr. Coe  At the time of the visit.  We reviewed the resident's history and exam and pertinent patient test results.  I agree with the assessment, diagnosis, and plan of care documented in the resident's note.  

## 2020-04-05 NOTE — Telephone Encounter (Signed)
Sure thing. I will resent the prescription.

## 2020-04-05 NOTE — Patient Instructions (Signed)
Thank you, [name] for allowing [place] to provide your care today. Today we discussed Pain and Blood pressure.    I have ordered the following labs for you:   Lab Orders     CMP14 + Anion Gap     CBC no Diff     POCT INR   Tests ordered today:  none  Referrals ordered today:   Referral Orders  No referral(s) requested today     I have ordered the following medication/changed the following medications:   Stop the following medications: Medications Discontinued During This Encounter  Medication Reason  . pantoprazole (PROTONIX) 20 MG tablet   . metFORMIN (GLUCOPHAGE) 500 MG tablet   . rosuvastatin (CRESTOR) 20 MG tablet   . senna (SENOKOT) 8.6 MG TABS tablet   . albuterol (VENTOLIN HFA) 108 (90 Base) MCG/ACT inhaler   . Magnesium Oxide 500 MG CAPS   . Cholecalciferol (VITAMIN D3 PO)      Start the following medications: Meds ordered this encounter  Medications  . diclofenac Sodium (VOLTAREN) 1 % GEL    Sig: Apply 4 g topically 4 (four) times daily.    Dispense:  480 g    Refill:  2    IM program  . Lidocaine HCl 3 % GEL    Sig: Apply 7.5 mg topically in the morning, at noon, and at bedtime.    Dispense:  90 mL    Refill:  3    IM program  . hydrochlorothiazide (HYDRODIURIL) 12.5 MG tablet    Sig: Take 1 tablet (12.5 mg total) by mouth daily.    Dispense:  30 tablet    Refill:  2    IM program     Follow up: 2-3 months   Should you have any questions or concerns please call the internal medicine clinic at [phone].     [name], D.O. [place]

## 2020-04-05 NOTE — Telephone Encounter (Signed)
Received call from [facility], asking you to resend RX for lidocaine gel.  States they don't know how much 7.5mg  will be and if you could specify amount (small amt, pea size amount, etc). Thank you, SChaplin, RN,BSN

## 2020-04-05 NOTE — Assessment & Plan Note (Addendum)
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  Still has pain from shoulder osteoarthritis knees and bilateral hip avascular necrosis.  Patient does have a history of alcohol use disorder and has lower extremity peripheral neuropathy likely secondary to his alcohol use.    Currently he is on Suboxone therapy for opioid use disorder and gabapentin for his neuropathic pain.  I counseled him on a multimodality approach to his chronic pain including good sleep hygiene, daily exercise, counseling, heating pad, and medication management to achieve and sustain his functional status.  Patient states that he is able to "walk up the road and back for 20 minutes" he does experience pain which caused him to rest after 20 minutes.  He states that his lower extremity peripheral neuropathy causing the most distress particularly when trying to get to sleep at night.  He is used lidocaine cream in the past help with the sensation which is allowed him to get to sleep easier.  I counseled him on starting lidocaine cream/gel for his hands and feet, Voltaren cream for his joint pain, and Tylenol [phone] mg twice daily.  I also stated that he may get benefit from trying another neuropathic pain medication like pregabalin, but I am concerned that his life insurance may limit this.  Therefore, he may need to increase his dose of gabapentin to better control his neuropathic pain.   Plan: 1. Start lidocaine gel for hands and feet before bed, voltaren cream for joint pain, and Tylenol 500-1000mg  BID 2. Increase Gabapentin to 400 mg TID (he had a refill of gabapentin 300 mg TID recently, so I will add a prescription for Gabapentin 100 mg TID to reach a total of 400 mg TID)

## 2020-04-06 ENCOUNTER — Other Ambulatory Visit: Payer: Self-pay | Admitting: Internal Medicine

## 2020-04-06 ENCOUNTER — Encounter: Payer: Self-pay | Admitting: Internal Medicine

## 2020-04-06 LAB — CBC
Hematocrit: 48.1 % (ref 37.5–51.0)
Hemoglobin: 16.8 g/dL (ref 13.0–17.7)
MCH: 27.6 pg (ref 26.6–33.0)
MCHC: 34.9 g/dL (ref 31.5–35.7)
MCV: 79 fL (ref 79–97)
Platelets: 338 x10E3/uL (ref 150–450)
RBC: 6.09 x10E6/uL — ABNORMAL HIGH (ref 4.14–5.80)
RDW: 13.1 % (ref 11.6–15.4)
WBC: 10.5 x10E3/uL (ref 3.4–10.8)

## 2020-04-06 LAB — CMP14 + ANION GAP
ALT: 19 IU/L (ref 0–44)
AST: 19 IU/L (ref 0–40)
Albumin/Globulin Ratio: 1.8 (ref 1.2–2.2)
Albumin: 4.8 g/dL (ref 3.8–4.9)
Alkaline Phosphatase: 129 IU/L — ABNORMAL HIGH (ref 44–121)
Anion Gap: 16 mmol/L (ref 10.0–18.0)
BUN/Creatinine Ratio: 9 (ref 9–20)
BUN: 6 mg/dL (ref 6–24)
Bilirubin Total: 0.4 mg/dL (ref 0.0–1.2)
CO2: 27 mmol/L (ref 20–29)
Calcium: 10.2 mg/dL (ref 8.7–10.2)
Chloride: 97 mmol/L (ref 96–106)
Creatinine, Ser: 0.69 mg/dL — ABNORMAL LOW (ref 0.76–1.27)
GFR calc Af Amer: 124 mL/min/{1.73_m2} (ref >59)
GFR calc non Af Amer: 108 mL/min/{1.73_m2} (ref >59)
Globulin, Total: 2.6 g/dL (ref 1.5–4.5)
Glucose: 76 mg/dL (ref 65–99)
Potassium: 4.9 mmol/L (ref 3.5–5.2)
Sodium: 140 mmol/L (ref 134–144)
Total Protein: 7.4 g/dL (ref 6.0–8.5)

## 2020-04-06 MED ORDER — GABAPENTIN 100 MG PO CAPS: 100.0000 mg | ORAL_CAPSULE | Freq: Three times a day (TID) | ORAL | 0 refills | Status: DC

## 2020-04-06 MED ORDER — LIDOCAINE HCL 3 % EX GEL
7.5000 mg | Freq: Three times a day (TID) | CUTANEOUS | 3 refills | Status: DC
Start: 2020-04-06 — End: 2020-04-06

## 2020-04-06 NOTE — Assessment & Plan Note (Signed)
Patient presents for reevaluation for his hypertension.  His blood pressure today is 153/84 on metoprolol 25 mg daily.  He states that he is compliant with his medication without any medication side effects.  Blood Pressure 04/05/2020 04/04/2020 03/06/2020 02/08/2020 01/11/2020  BP 153/84 159/84 143/86 161/87 136/97  Some recent data might be hidden   Plan: 1.  Start HCTZ 12.5 mg daily and continue metoprolol 25 mg daily 2. CMP to check electrolytes and kidney function

## 2020-04-06 NOTE — Addendum Note (Signed)
Addended by: [name] on: 04/06/2020 02:38 PM   Modules accepted: Orders

## 2020-04-06 NOTE — Assessment & Plan Note (Signed)
Patient states that he is not taking his crestor 20 mg. I counseled him regarding diet and life-style changes and will follow up with a lipid panel in 3 months. If there is no improvement then we will restart this medication.

## 2020-04-07 MED FILL — LIDORX 3 % GEL: 3 | 30 days supply | Qty: 90 | Fill #0

## 2020-04-10 NOTE — Progress Notes (Signed)
Internal Medicine Clinic Attending  Case discussed with Dr. Coe  At the time of the visit.  We reviewed the resident's history and exam and pertinent patient test results.  I agree with the assessment, diagnosis, and plan of care documented in the resident's note.  

## 2020-04-14 ENCOUNTER — Encounter: Payer: Self-pay | Admitting: Student

## 2020-04-17 MED FILL — BUPREN-NALOX SL TAB 8-2MG: 8-2 | 14 days supply | Qty: 42 | Fill #1

## 2020-05-02 ENCOUNTER — Other Ambulatory Visit: Payer: Self-pay | Admitting: Internal Medicine

## 2020-05-02 ENCOUNTER — Ambulatory Visit (INDEPENDENT_AMBULATORY_CARE_PROVIDER_SITE_OTHER): Payer: Self-pay | Admitting: Internal Medicine

## 2020-05-02 VITALS — BP 156/94 | HR 67 | Temp 98.4°F | Wt 213.5 lb

## 2020-05-02 DIAGNOSIS — F1120 Opioid dependence, uncomplicated: Secondary | ICD-10-CM

## 2020-05-02 DIAGNOSIS — G629 Polyneuropathy, unspecified: Secondary | ICD-10-CM

## 2020-05-02 DIAGNOSIS — G621 Alcoholic polyneuropathy: Secondary | ICD-10-CM | POA: Insufficient documentation

## 2020-05-02 MED ORDER — FOLIC ACID 1 MG PO TABS: 1.0000 mg | ORAL_TABLET | Freq: Every day | ORAL | 1 refills | Status: DC

## 2020-05-02 MED ORDER — BUPRENORPHINE HCL-NALOXONE HCL 8-2 MG SL SUBL: 1.0000 | SUBLINGUAL_TABLET | Freq: Three times a day (TID) | SUBLINGUAL | 1 refills | Status: DC

## 2020-05-02 MED FILL — BUPREN-NALOX SL TAB 8-2MG: 8-2 | 14 days supply | Qty: 42 | Fill #0

## 2020-05-02 NOTE — Progress Notes (Signed)
   05/02/2020  Mark [name] presents for follow up of opioid use disorder I have reviewed the prior induction visit, follow up visits, and telephone encounters relevant to opiate use disorder (OUD) treatment.   Current daily dose: Suboxone 8-2 mg, 1 tablet TID  Date of Induction: 05/21/19  Current follow up interval, in weeks: 4  The patient has been adherent with the buprenorphine for OUD contract.   Last UDS Result: 02/08/2020, Suboxone present. Unexpected finding - ethyl metabolites  HPI: This is a 55 year old with a history of OUD who presented for follow up of his OUD.  He/She reports he has been adherent to Suboxone therapy but does not control his cravings entirely. We discussed increasing his daily dose to help with cravings. Denies any relapse and feels he/she is doing well on Suboxone therapy.   Exam:   Vitals:   05/02/20 0938  BP: (!) 156/94  Pulse: 67  Temp: 98.4 F (36.9 C)  TempSrc: Oral  SpO2: 99%  Weight: 213 lb 8 oz (96.8 kg)   General: NAD, nl appearance Cardiovascular: Normal rate, regular rhythm.  No murmurs, rubs, or gallops Pulmonary : Effort normal, breath sounds normal. No wheezes, rales, or rhonchi Abdominal: soft, nontender,  bowel sounds present Psychiatric/Behavioral:  normal mood, normal behavior   Assessment/Plan:  See Problem Based Charting in the Encounters Tab  Mark Persia, MD  05/02/2020  6:49 PM

## 2020-05-02 NOTE — Assessment & Plan Note (Signed)
[name] presents today for follow-up regarding his Suboxone therapy.  He feels that Suboxone is helping with his cravings and denies any relapses, he does still endorse spending a lot of time thinking about pain medication given his chronic pain syndrome.  He notes that in the past he had been on methadone, which better controlled his cravings and his pain, however he had a poor experience when he stopped the medication and this made him scared to ever try it again.  -Refilled Suboxone 8-2, 1 tablet TID -1 month follow-up -Tox assure pending

## 2020-05-02 NOTE — Assessment & Plan Note (Addendum)
Mr. Farney has a longstanding history of polyneuropathy that is a part of his chronic pain syndrome.  Per chart review, it is most likely that patient's polyneuropathy is secondary to alcohol use disorder, although patient continues to deny daily use of alcohol.  It looks like every urine drug screen he has had for the past year showed ethanol metabolites.  However, polyneuropathy has not been fully evaluated for alternative causes.  TSH and B12 have been checked before and it always been within normal limits.  Neurosyphilis is less likely given negative RPR in the past patient denies any sexual activity for the past 7 years.  Of note, patient's folate was low in 2012 and this has not been reevaluated.  Given patient's lack of insurance, will start with checking folate today.  He may ultimately benefit from a referral to neurology for EMG if he can obtain the orders card.  This was discussed with Mr. Troiani and he is in agreement.  -Start folic acid 1 mg daily -Folate level pending -Continue gabapentin -Follow-up with PCP

## 2020-05-02 NOTE — Patient Instructions (Addendum)
It was nice seeing you today! Thank you for choosing [facility] for your Primary Care.    Today we talked about:   1. I have sent in refills for your Suboxone.  2. For your neuropathy, I am checking folate levels. This is a vitamin that can sometimes cause neuropathy when you are low. It looks like you were low a few years ago, so please start taking daily folic acid. I will call you with your results.  3. I recommend you make an appointment with Dr. [name] to keep discussing your other medical problems.

## 2020-05-03 LAB — FOLATE: Folate: 9.7 ng/mL (ref >3.0)

## 2020-05-04 NOTE — Progress Notes (Signed)
Internal Medicine Clinic Attending  Case discussed with Dr. Basaraba  At the time of the visit.  We reviewed the resident's history and exam and pertinent patient test results.  I agree with the assessment, diagnosis, and plan of care documented in the resident's note.  

## 2020-05-07 LAB — TOXASSURE SELECT,+ANTIDEPR,UR

## 2020-05-08 NOTE — Progress Notes (Signed)
Folate levels WNL. Attempted to contact patient with results. Reached mother. Patient will call clinic back if he has any questions.

## 2020-05-15 MED FILL — BUPREN-NALOX SL TAB 8-2MG: 8-2 | 14 days supply | Qty: 42 | Fill #1

## 2020-05-30 ENCOUNTER — Other Ambulatory Visit: Payer: Self-pay | Admitting: Internal Medicine

## 2020-05-30 ENCOUNTER — Ambulatory Visit (INDEPENDENT_AMBULATORY_CARE_PROVIDER_SITE_OTHER): Payer: Self-pay | Admitting: Internal Medicine

## 2020-05-30 ENCOUNTER — Other Ambulatory Visit: Payer: Self-pay

## 2020-05-30 DIAGNOSIS — F1120 Opioid dependence, uncomplicated: Secondary | ICD-10-CM

## 2020-05-30 MED ORDER — BUPRENORPHINE HCL-NALOXONE HCL 8-2 MG SL SUBL: 1.0000 | SUBLINGUAL_TABLET | Freq: Three times a day (TID) | SUBLINGUAL | 1 refills | Status: DC

## 2020-05-30 MED FILL — BUPREN-NALOX SL 8-2MG tab: 8-2 | 14 days supply | Qty: 42 | Fill #0

## 2020-05-30 NOTE — Patient Instructions (Addendum)
To [name],  It was a pleasure meeting you today. Today we went over your current suboxone regimen. We will continue on the 8-2 mg three times daily schedule and see you back in a month's time for your next visit.   Instruction for starting buprenorphine-naloxone (Suboxone) at home  You should not mix buprenorphine-naloxone with other drugs especially large amounts of alcohol or benzodiazepines (Valium, Klonopin, Xanax, Ativan). If you have taken any of these medication, please tell your healthcare team and do not take buprenorphine-naloxone.   You must wait until you are feeling signs of withdrawal from opiates (heroin, pain pills) before you take buprenorphine-naloxone.  If you do not wait long enough the medication will make you sicker.  If you do take it too soon and get sicker then wait until later when you feel signs of withdrawal listed below and then try again.   Signs that you are withdrawing: ? Anxiety, restlessness, can't sit still ? Aches ? Nausea or sick to your stomach ? Goose-bumps ? Racing heart   You should have ALL of these symptoms before you start taking your first dose of buprenorphine-naloxone. If you are not sure call your healthcare team.    When it's time to take your first dose 1. Split your pill or film in half 2. Make sure your mouth is empty of everything (no candy/gum/etc) 3. Sit or stand, but do not lie down 4. Swallow a sip of water to wet your mouth  5. Put the half of the tablet or film under your tongue. Do not suck or swallow it. It must stay there until it is completely dissolved. Try to not even swallow your spit during this time. Anything that you swallow will not make you feel better.   In 20 minutes: You should start feeling a little better. If you feel worse then you started too early so you would wait a few hours and then try again later.    In one hour: You can take the other half of the pill or film the same way you took the first one.    In 2 hours: if you are still feeling symptoms of withdrawal listed above you can take another half a pill or film. You can repeat this if needed until you take a total of 2 pills or 2 films (16mg ). You may need less than this to control your symptoms.  You should adjust your dose so that you are taking one and half or two pills or films per day (12-16mg  per day). At this dose you should have cut down on cravings and help with any withdrawal symptoms.   The next day:  In the morning you can take the same amount you took yesterday all at one time in the morning.  Expect a call from your team to see how you are doing.   If you have any questions or concerns at any time call your healthcare team.  Clinic Number:  [place]: [phone] After Hours Number: [phone] - - Leave your number and expect a call back from a physician.

## 2020-05-30 NOTE — Assessment & Plan Note (Signed)
Mr. Kennon Rounds presents to the [place] clinic today for in person follow up on his Suboxone regimen. He is compliant with his medications, and has been doing well on his current dose of Suboxone 8-2mg  TID. He has not had any cravings or relapses since his last visit. PDMP was reviewed and is appropriate. Given that on this visit he has had no cravings, we will do one more 4 week follow up, and if his lack of symptoms persists, consider switching to 8 weeks.  - Refilled Suboxone 8-2 mg, 1 tablet TID - 4 week follow up, if continues to deny cravings, consider scheduling F/U visit in 8 weeks.

## 2020-05-30 NOTE — Progress Notes (Signed)
   05/30/2020  [name] presents for follow up of opioid use disorder I have reviewed the prior induction visit, follow up visits, and telephone encounters relevant to opiate use disorder (OUD) treatment.   Current daily dose: Suboxone 8-2 mg, 1 tab TID  Date of Induction: 05/21/19  Current follow up interval, in weeks: 4  The patient has been adherent with the buprenorphine for OUD contract.   Last UDS Result: EXPECTED: Buprenorphine and Norbuprenorphine. UNEXPECTED Ethyl Glucuronide and Ethyl Sulfate.  HPI:  Patient states that he is doing well with his current regimen of Suboxone 8-2 mg TID. He states that it "does its job." He has not had any cravings, seeking behavior, relapse or side effects of his medications. He states that his suboxone does not do as well a job from alleviating his chronic pain, but has drastically helped curb his withdrawal symptoms.   Exam:   Vitals:   05/30/20 0843  Weight: 213 lb 6.4 oz (96.8 kg)  Height: 6\' 4"  (1.93 m)    Physical Exam Vitals reviewed.  Constitutional:      General: He is not in acute distress.    Appearance: Normal appearance. He is not ill-appearing or toxic-appearing.  Eyes:     General:        Right eye: No discharge.        Left eye: No discharge.     Conjunctiva/sclera: Conjunctivae normal.  Cardiovascular:     Rate and Rhythm: Normal rate and regular rhythm.     Pulses: Normal pulses.     Heart sounds: Normal heart sounds. No murmur heard. No friction rub. No gallop.   Pulmonary:     Effort: Pulmonary effort is normal.     Breath sounds: Normal breath sounds. No wheezing, rhonchi or rales.  Abdominal:     General: Bowel sounds are normal.     Palpations: Abdomen is soft.     Tenderness: There is no abdominal tenderness. There is no guarding.  Musculoskeletal:        General: No swelling.     Right lower leg: No edema.     Left lower leg: No edema.  Neurological:     General: No focal deficit present.      Mental Status: He is alert and oriented to person, place, and time.  Psychiatric:        Mood and Affect: Mood normal.        Behavior: Behavior normal.     Assessment/Plan:  See Problem Based Charting in the Encounters Tab     Maudie Mercury, MD  05/30/2020  8:47 AM

## 2020-06-06 NOTE — Progress Notes (Signed)
Internal Medicine Clinic Attending ? ?Case discussed with Dr. Winters  At the time of the visit.  We reviewed the resident?s history and exam and pertinent patient test results.  I agree with the assessment, diagnosis, and plan of care documented in the resident?s note.  ?

## 2020-06-12 ENCOUNTER — Other Ambulatory Visit: Payer: Self-pay | Admitting: Internal Medicine

## 2020-06-12 ENCOUNTER — Other Ambulatory Visit (HOSPITAL_COMMUNITY): Payer: Self-pay

## 2020-06-12 DIAGNOSIS — G894 Chronic pain syndrome: Secondary | ICD-10-CM

## 2020-06-12 MED FILL — Buprenorphine HCl-Naloxone HCl SL Tab 8-2 MG (Base Equiv): SUBLINGUAL | 14 days supply | Qty: 42 | Fill #0 | Status: AC

## 2020-06-12 MED FILL — Lidocaine HCl Gel 3%: CUTANEOUS | 30 days supply | Qty: 90 | Fill #0 | Status: CN

## 2020-06-13 MED ORDER — LIDORX 3 % EX GEL
CUTANEOUS | 3 refills | Status: DC
  Filled 2020-06-13: qty 90, 30d supply, fill #0

## 2020-06-14 ENCOUNTER — Other Ambulatory Visit (HOSPITAL_COMMUNITY): Payer: Self-pay

## 2020-06-15 ENCOUNTER — Other Ambulatory Visit (HOSPITAL_COMMUNITY): Payer: Self-pay

## 2020-06-23 ENCOUNTER — Other Ambulatory Visit (HOSPITAL_COMMUNITY): Payer: Self-pay

## 2020-06-27 ENCOUNTER — Ambulatory Visit (INDEPENDENT_AMBULATORY_CARE_PROVIDER_SITE_OTHER): Payer: Self-pay | Admitting: Internal Medicine

## 2020-06-27 ENCOUNTER — Other Ambulatory Visit (HOSPITAL_COMMUNITY): Payer: Self-pay

## 2020-06-27 ENCOUNTER — Other Ambulatory Visit: Payer: Self-pay

## 2020-06-27 VITALS — BP 141/83 | HR 60 | Temp 98.3°F | Ht 76.0 in | Wt 214.1 lb

## 2020-06-27 DIAGNOSIS — F1120 Opioid dependence, uncomplicated: Secondary | ICD-10-CM

## 2020-06-27 MED ORDER — BUPRENORPHINE HCL-NALOXONE HCL 8-2 MG SL SUBL
SUBLINGUAL_TABLET | SUBLINGUAL | 1 refills | Status: DC
  Filled 2020-06-27: qty 42, 14d supply, fill #0
  Filled 2020-07-10: qty 42, 14d supply, fill #1

## 2020-06-27 NOTE — Progress Notes (Signed)
    06/27/2020  HPI: Mark [name] presents for follow up of opioid use disorder I have reviewed the prior induction visit, follow up visits, and telephone encounters relevant to opiate use disorder (OUD) treatment.   Current daily dose: Suboxone 8-2 mg 3 times daily  Date of Induction: 05/21/19  Current follow up interval, in weeks: 4 weeks  The patient has been adherent with the buprenorphine for OUD contract.   Last UDS Result: 05/02/2020 with buprenorphine.  Unexpectedly positive for ethylene sulfate/Glucuronide (metabolites from ethyl alcohol)   Exam:   Vitals:   06/27/20 0953  BP: (!) 141/83  Pulse: 60  Temp: 98.3 F (36.8 C)  TempSrc: Oral  SpO2: 95%  Weight: 214 lb 1.6 oz (97.1 kg)  Height: 6\' 4"  (1.93 m)   Physical Exam Constitutional:      Appearance: Normal appearance.  Cardiovascular:     Rate and Rhythm: Normal rate.     Pulses: Normal pulses.     Heart sounds: Normal heart sounds.  Pulmonary:     Effort: Pulmonary effort is normal.     Breath sounds: Normal breath sounds.  Abdominal:     General: Bowel sounds are normal.     Palpations: Abdomen is soft.     Tenderness: There is no abdominal tenderness.  Musculoskeletal:     Cervical back: Normal range of motion.  Skin:    General: Skin is warm and dry.  Neurological:     Mental Status: Mark [name] is alert and oriented to person, place, and time. Mental status is at baseline.  Psychiatric:        Mood and Affect: Mood normal.      Assessment/Plan:  See Problem Based Charting in the Encounters Tab   Mark [name], D.O.  Internal Medicine Resident, PGY-2 [facility]  Pager: [phone] 12:15 PM, 06/27/2020

## 2020-06-27 NOTE — Assessment & Plan Note (Signed)
Patient presents for reevaluation of opioid use disorder.  Patient states is doing well.  His mother and his daughter are both living with him.  His daughter has a 55-year-old child who he has been actively participating in raising caring for.  He states that everything is been going well but a things are little hectic at home.  He admits to having a difficult time with his chronic pain.  He denies any alcohol use.  He set he does not have any cravings on his current dose nor has he experienced any withdrawals.   Last appointment: 05/30/20 Last UDS on 05/02/20 - positive for buprenorphine  PDMP reviewed. Last written on 05/30/20 and last filled on 04/04  Assessment: Patient is living with opioid use disorder in remission  Plan: 1. Continue Suboxone 8-2 mg 3 times daily 2. UDS at next OUD visit.

## 2020-06-27 NOTE — Patient Instructions (Signed)
Thank you, Mr.Tryson Vane Yapp for allowing [place] to provide your care today. Today we discussed OUD.    I have ordered the following labs for you:  Lab Orders  No laboratory test(s) ordered today     Tests ordered today:  none  Referrals ordered today:   Referral Orders  No referral(s) requested today     Medication Changes:   Medications Discontinued During This Encounter  Medication Reason  . buprenorphine-naloxone (SUBOXONE) 8-2 mg SUBL SL tablet Reorder     Meds ordered this encounter  Medications  . buprenorphine-naloxone (SUBOXONE) 8-2 mg SUBL SL tablet    Sig: DISSOLVE 1 TABLET UNDER THE TONGUE IN THE MORNING, AT NOON, AND AT BEDTIME    Dispense:  42 tablet    Refill:  1    NADEAN:[id number]     Follow up: 1 month   Remember:   Should you have any questions or concerns please call the internal medicine clinic at [phone].     [name], D.O. [place]

## 2020-07-03 NOTE — Progress Notes (Signed)
Internal Medicine Clinic Attending  Case discussed with Dr. Coe  At the time of the visit.  We reviewed the resident's history and exam and pertinent patient test results.  I agree with the assessment, diagnosis, and plan of care documented in the resident's note.  

## 2020-07-05 ENCOUNTER — Other Ambulatory Visit (HOSPITAL_COMMUNITY): Payer: Self-pay

## 2020-07-05 ENCOUNTER — Other Ambulatory Visit: Payer: Self-pay

## 2020-07-05 ENCOUNTER — Ambulatory Visit (HOSPITAL_COMMUNITY)
Admission: RE | Admit: 2020-07-05 | Discharge: 2020-07-05 | Disposition: A | Discharge status: Home / Self Care | Payer: Self-pay | Source: Ambulatory Visit | Attending: Internal Medicine | Admitting: Internal Medicine

## 2020-07-05 ENCOUNTER — Encounter: Payer: Self-pay | Admitting: Internal Medicine

## 2020-07-05 ENCOUNTER — Ambulatory Visit (INDEPENDENT_AMBULATORY_CARE_PROVIDER_SITE_OTHER): Payer: Self-pay | Admitting: Internal Medicine

## 2020-07-05 VITALS — BP 157/93 | HR 66 | Temp 98.2°F | Ht 76.0 in | Wt 212.7 lb

## 2020-07-05 DIAGNOSIS — I10 Essential (primary) hypertension: Secondary | ICD-10-CM

## 2020-07-05 DIAGNOSIS — I4581 Long QT syndrome: Secondary | ICD-10-CM

## 2020-07-05 DIAGNOSIS — G894 Chronic pain syndrome: Secondary | ICD-10-CM

## 2020-07-05 DIAGNOSIS — E782 Mixed hyperlipidemia: Secondary | ICD-10-CM

## 2020-07-05 DIAGNOSIS — R7303 Prediabetes: Secondary | ICD-10-CM

## 2020-07-05 DIAGNOSIS — F1020 Alcohol dependence, uncomplicated: Secondary | ICD-10-CM

## 2020-07-05 DIAGNOSIS — M792 Neuralgia and neuritis, unspecified: Secondary | ICD-10-CM

## 2020-07-05 LAB — POCT GLYCOSYLATED HEMOGLOBIN (HGB A1C): Hemoglobin A1C: 5.5 % (ref 4.0–5.6)

## 2020-07-05 LAB — GLUCOSE, CAPILLARY: Glucose-Capillary: 103 mg/dL — ABNORMAL HIGH (ref 70–99)

## 2020-07-05 MED ORDER — GABAPENTIN 100 MG PO CAPS
ORAL_CAPSULE | ORAL | 2 refills | Status: DC
  Filled 2020-07-05: qty 150, 30d supply, fill #0
  Filled 2020-08-13: qty 150, 30d supply, fill #1
  Filled 2020-09-15: qty 150, 30d supply, fill #2

## 2020-07-05 MED ORDER — HYDROCHLOROTHIAZIDE 25 MG PO TABS
25.0000 mg | ORAL_TABLET | Freq: Every day | ORAL | 2 refills | Status: DC
  Filled 2020-07-05: qty 30, 30d supply, fill #0
  Filled 2020-09-15: qty 30, 30d supply, fill #1
  Filled 2020-11-27: qty 30, 30d supply, fill #2

## 2020-07-05 MED ORDER — LIDORX 3 % EX GEL
CUTANEOUS | 3 refills | Status: DC
  Filled 2020-07-05: qty 90, 30d supply, fill #0
  Filled 2020-09-19: qty 90, 30d supply, fill #1

## 2020-07-05 NOTE — Progress Notes (Signed)
CC: HTN  HPI:  [name] is a 55 y.o. male with a past medical history stated below and presents today for HTN. Please see problem based assessment and plan for additional details.  Past Medical History:  Diagnosis Date  . Alcohol withdrawal ([place])   . Allergy   . Anxiety Dx 2014  . Chronic hip pain (Right) 03/23/2018  . Chronic low back pain (Secondary area of Pain) (Bilateral) (R>L) 12/28/2012  . Chronic lower extremity pain (Bilateral) (R>L) 06/26/2016  . Chronic musculoskeletal pain 04/15/2016  . Chronic radicular low back pain (Third area of Pain) (Right) (to calf) 06/02/2014  . Chronic sacroiliac joint pain (Bilateral) (R>L) 08/22/2016  . Chronic sacroiliac joint pain (Left) 03/23/2018  . Chronic shoulder pain (Right) 05/12/2014   [place] in March 2016 on right shoulder. 06/22/2014: [facility] [place]:  Partial full-thickness tear with minimal retraction of supraspinatus. Status post injection and referral to PT   . COPD (chronic obstructive pulmonary disease) ([place])   . Drug abuse ([place])    pt reports opioid dependence due to previous back surgeries  . Dyspepsia   . Elevated LFTs 08/15/2018  . Enlarged prostate   . ETOH abuse   . GERD (gastroesophageal reflux disease) Dx 2003  . Headache(784.0)   . Hepatitis   . Hyperalgesia (Lower extremities) 12/26/2016  . Hyperlipidemia Dx 2000  . Hypertension Dx 2011  . Hypokalemia   . Hyponatremia 08/15/2018  . IBS (irritable bowel syndrome)    alternates constiaption/ diarrhea  . Irregular heart beat   . 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 Mental disorder   . Neuromuscular disorder ([place])    hands hurt   . Neuropathy 06/07/2013  . Opiate use 05/14/2016  . Pain management contract broken 12/15/2017   Pain medication contract broken on 12/15/2017 and again around 05/17/2019.  The patient continues to be noncompliant with the medication rules and regulations.  He also continues to consume alcohol and to use more medication than prescribed.   . Seizure  due to alcohol withdrawal ([place]) 01/23/2014   Per patient report  . Shortness of breath   . Withdrawal seizures ([place]) 2014   last seizure 2014 etoh with drawal    Current Outpatient Medications on File Prior to Visit  Medication Sig Dispense Refill  . buprenorphine-naloxone (SUBOXONE) 8-2 mg SUBL SL tablet DISSOLVE 1 TABLET UNDER THE TONGUE IN THE MORNING, AT NOON, AND AT BEDTIME 42 tablet 1  . diclofenac Sodium (VOLTAREN) 1 % GEL APPLY 4 GRAMS TO AFFECTED AREA 4 TIMES DAILY 480 g 2  . dicyclomine (BENTYL) 10 MG capsule TAKE 2 CAPSULES BY MOUTH EVERY 8 HOURS AS NEEDED FOR SPASMS 90 capsule 2  . folic acid (FOLVITE) 1 MG tablet TAKE 1 TABLET BY MOUTH ONCE A DAY 90 tablet 1  . metoprolol tartrate (LOPRESSOR) 25 MG tablet TAKE 1 TABLET BY MOUTH 2 TIMES DAILY 60 tablet 2  . tamsulosin (FLOMAX) 0.4 MG CAPS capsule TAKE 1 CAPSULE BY MOUTH ONCE DAILY 90 capsule 11  . [DISCONTINUED] pantoprazole (PROTONIX) 20 MG tablet Take 1 tablet (20 mg total) by mouth daily. 30 tablet 1   No current facility-administered medications on file prior to visit.    Family History  Problem Relation Age of Onset  . Lung cancer Father        was a smoker  . Alcohol abuse Father   . Hypertension Mother   . Colon polyps Mother   . Breast cancer Paternal Grandmother   .  Alcohol abuse Paternal Grandfather   . Colon cancer Neg Hx   . Rectal cancer Neg Hx   . Stomach cancer Neg Hx     Social History   Socioeconomic History  . Marital status: Divorced    Spouse name: Not on file  . Number of children: 2  . Years of education: Not on file  . Highest education level: Not on file  Occupational History  . Occupation: Unemployed  Tobacco Use  . Smoking status: Current Every Day Smoker    Packs/day: 0.75    Years: 30.00    Pack years: 22.50    Types: Cigarettes  . Smokeless tobacco: Never Used  . Tobacco comment: .5 PPD- 1ppd   Vaping Use  . Vaping Use: Never used  Substance and Sexual Activity  . Alcohol  use: Yes    Comment: Drinks beer.  . Drug use: No    Comment: Opana  . Sexual activity: Not Currently  Other Topics Concern  . Not on file  Social History Narrative   Pt lives with mother.  Not unemployed due to chronic pain, but does not receive disability.  Not followed by an outpatient provider.   Social Determinants of Health   Financial Resource Strain: Not on file  Food Insecurity: Not on file  Transportation Needs: Not on file  Physical Activity: Not on file  Stress: Not on file  Social Connections: Not on file  Intimate Partner Violence: Not on file    Review of Systems: ROS negative except for what is noted on the assessment and plan.  Vitals:   07/05/20 1322  BP: (!) 157/93  Pulse: 66  Temp: 98.2 F (36.8 C)  TempSrc: Oral  SpO2: 98%  Weight: 212 lb 11.2 oz (96.5 kg)  Height: 6\' 4"  (1.93 m)    Physical Exam: Gen: A&O x3 and in no apparent distress, well appearing and nourished. HEENT: Head - normocephalic, atraumatic. Eye -  visual acuity grossly intact, conjunctiva clear, sclera non-icteric, EOM intact. Mouth - No obvious caries or periodontal disease. Neck: no obvious masses or nodules, AROM intact. CV: RRR, no murmurs, rubs, or gallops. S1/S2 presents  Resp: Clear to ascultation bilaterally  Abd: BS (+) x4, soft, non-tender, without obvious hepatosplenomegaly or masses MSK: Grossly normal AROM and strength x4 extremities. Skin: good skin turgor, no rashes, unusual bruising, or prominent lesions.  Neuro: No focal deficits, grossly normal sensation and coordination.  Psych: Oriented x3 and responding appropriately. Intact recent and remote memory, normal mood, judgement, affect , and insight.    Assessment & Plan:   See Encounters Tab for problem based charting.  Patient discussed with Dr. Karie Schwalbe, D.O. [facility], PGY-2 Pager: [phone], Phone: [phone] Date 07/06/2020 Time 10:47 AM

## 2020-07-05 NOTE — Patient Instructions (Addendum)
Thank you, [name] for allowing [place] to provide your care today. Today we discussed blood pressure, prediabetes, cholesterol.    I have ordered the following labs for you:   Lab Orders     Lipid Profile     CMP14 + Anion Gap     Glucose, capillary     POC Hbg A1C   Tests ordered today:  EKG  Referrals ordered today:   Referral Orders  No referral(s) requested today     Medication Changes:   Medications Discontinued During This Encounter  Medication Reason  . hydrochlorothiazide (HYDRODIURIL) 12.5 MG tablet Dose change  . Lidocaine HCl (LIDORX) 3 % GEL Reorder     Meds ordered this encounter  Medications  . Lidocaine HCl (LIDORX) 3 % GEL    Sig: APPLY PEA-SIZED AMOUNT (7.5MG ) TO AFFECTED AREAS IN MORNING, AT NOON, AND AT BEDTIME.    Dispense:  90 mL    Refill:  3  . hydrochlorothiazide (HYDRODIURIL) 25 MG tablet    Sig: Take 1 tablet (25 mg total) by mouth daily.    Dispense:  30 tablet    Refill:  2     Instructions:   Follow up: 3 months   Remember:    Should you have any questions or concerns please call the internal medicine clinic at [phone].     [name], D.O. [place]

## 2020-07-06 ENCOUNTER — Encounter: Payer: Self-pay | Admitting: Internal Medicine

## 2020-07-06 ENCOUNTER — Other Ambulatory Visit (HOSPITAL_COMMUNITY): Payer: Self-pay

## 2020-07-06 LAB — CMP14 + ANION GAP
ALT: 19 IU/L (ref 0–44)
AST: 14 IU/L (ref 0–40)
Albumin/Globulin Ratio: 1.7 (ref 1.2–2.2)
Albumin: 4.4 g/dL (ref 3.8–4.9)
Alkaline Phosphatase: 137 IU/L — ABNORMAL HIGH (ref 44–121)
Anion Gap: 14 mmol/L (ref 10.0–18.0)
BUN/Creatinine Ratio: 6 — ABNORMAL LOW (ref 9–20)
BUN: 5 mg/dL — ABNORMAL LOW (ref 6–24)
Bilirubin Total: 0.3 mg/dL (ref 0.0–1.2)
CO2: 28 mmol/L (ref 20–29)
Calcium: 9.8 mg/dL (ref 8.7–10.2)
Chloride: 97 mmol/L (ref 96–106)
Creatinine, Ser: 0.79 mg/dL (ref 0.76–1.27)
Globulin, Total: 2.6 g/dL (ref 1.5–4.5)
Glucose: 92 mg/dL (ref 65–99)
Potassium: 4.6 mmol/L (ref 3.5–5.2)
Sodium: 139 mmol/L (ref 134–144)
Total Protein: 7 g/dL (ref 6.0–8.5)
eGFR: 106 mL/min/{1.73_m2} (ref >59)

## 2020-07-06 LAB — LIPID PANEL
Chol/HDL Ratio: 5 ratio (ref 0.0–5.0)
Cholesterol, Total: 222 mg/dL — ABNORMAL HIGH (ref 100–199)
HDL: 44 mg/dL (ref >39)
LDL Chol Calc (NIH): 138 mg/dL — ABNORMAL HIGH (ref 0–99)
Triglycerides: 220 mg/dL — ABNORMAL HIGH (ref 0–149)
VLDL Cholesterol Cal: 40 mg/dL (ref 5–40)

## 2020-07-06 MED ORDER — ROSUVASTATIN CALCIUM 20 MG PO TABS
20.0000 mg | ORAL_TABLET | Freq: Every day | ORAL | 11 refills | Status: DC
Start: 2020-07-06 — End: 2021-01-18
  Filled 2020-07-06: qty 30, 30d supply, fill #0
  Filled 2020-11-27: qty 30, 30d supply, fill #1

## 2020-07-06 NOTE — Assessment & Plan Note (Addendum)
Patient has a history of prolonged QT interval with frequent PVCs.  He is on medications that can prolong his QT interval.  Therefore, EKG was performed today.  EKG was normal sinus rhythm and QTC was within normal limits.

## 2020-07-06 NOTE — Assessment & Plan Note (Addendum)
Patient stated that his gabapentin makes him feel sedated during the day.  Therefore we agreed to decrease his dose to 200 mg in the morning, 200 mg midday, and 300 mg at night.  This is decreased from his 300 mg 3 times daily dosing.  Furthermore, I will refill his lidocaine patches today.

## 2020-07-06 NOTE — Progress Notes (Signed)
Internal Medicine Clinic Attending  Case discussed with Dr. Coe  At the time of the visit.  We reviewed the resident's history and exam and pertinent patient test results.  I agree with the assessment, diagnosis, and plan of care documented in the resident's note.  

## 2020-07-06 NOTE — Assessment & Plan Note (Addendum)
Patient has a history of significant mixed hyperlipidemia.  His current ASCVD risk score is 20.4%.  At his past visit, I counseled him regarding his risk of heart attack and stroke.  He stated that he wanted to try lifestyle modifications to improve his cholesterol.  Therefore, I will repeat his lipid panel today and if his cholesterol remains elevated, then he has agreed to restart statin therapy.  Plan: - Lipid panel - Likely will restart rosuvastatin 20 mg daily for primary prevention   **ADDENDUM**  Patient's lipid panel remains elevated as shown below.  Therefore, I will prescribe him rosuvastatin 20 mg daily.  Lipid Panel     Component Value Date/Time   CHOL 222 (H) 07/05/2020 1408   TRIG 220 (H) 07/05/2020 1408   HDL 44 07/05/2020 1408   CHOLHDL 5.0 07/05/2020 1408   LDLCALC 138 (H) 07/05/2020 1408   LABVLDL 40 07/05/2020 1408

## 2020-07-06 NOTE — Assessment & Plan Note (Signed)
Patient presents for reevaluation of his blood pressure.  His blood pressure today is 157/93.  His medication list shows hydrochlorothiazide 12.5 mg and metoprolol 25 mg twice daily.  Patient states that he is not taking hydrochlorothiazide.  Patient states is not currently exercising.   Plan: -Counseled regarding the importance of his medication management. -Discuss the significance of high blood pressure and its effects on increasing his risk of heart disease and stroke. -We will restart hydrochlorothiazide at 25 mg daily -Continue metoprolol 25 mg twice daily

## 2020-07-06 NOTE — Assessment & Plan Note (Signed)
History of prediabetes with an A1c as high as 6.4.  Patient admits to worsening peripheral neuropathy particularly in his fingers and toes.  Although I suspect this is secondary to his alcohol use disorder, I will repeat a hemoglobin A1c today to further evaluate his need for medication management for his prediabetes.

## 2020-07-10 ENCOUNTER — Other Ambulatory Visit (HOSPITAL_COMMUNITY): Payer: Self-pay

## 2020-07-10 MED FILL — Metoprolol Tartrate Tab 25 MG: ORAL | 30 days supply | Qty: 60 | Fill #0 | Status: AC

## 2020-07-25 ENCOUNTER — Ambulatory Visit (HOSPITAL_COMMUNITY)
Admission: RE | Admit: 2020-07-25 | Discharge: 2020-07-25 | Disposition: A | Discharge status: Home / Self Care | Payer: Self-pay | Source: Ambulatory Visit | Attending: Internal Medicine | Admitting: Internal Medicine

## 2020-07-25 ENCOUNTER — Other Ambulatory Visit (HOSPITAL_COMMUNITY): Payer: Self-pay

## 2020-07-25 ENCOUNTER — Other Ambulatory Visit: Payer: Self-pay

## 2020-07-25 ENCOUNTER — Ambulatory Visit (INDEPENDENT_AMBULATORY_CARE_PROVIDER_SITE_OTHER): Payer: Self-pay | Admitting: Internal Medicine

## 2020-07-25 VITALS — BP 131/84 | HR 75 | Temp 98.2°F | Wt 213.0 lb

## 2020-07-25 DIAGNOSIS — F1120 Opioid dependence, uncomplicated: Secondary | ICD-10-CM

## 2020-07-25 DIAGNOSIS — R079 Chest pain, unspecified: Secondary | ICD-10-CM | POA: Insufficient documentation

## 2020-07-25 DIAGNOSIS — F17200 Nicotine dependence, unspecified, uncomplicated: Secondary | ICD-10-CM

## 2020-07-25 DIAGNOSIS — R1013 Epigastric pain: Secondary | ICD-10-CM

## 2020-07-25 DIAGNOSIS — Z720 Tobacco use: Secondary | ICD-10-CM

## 2020-07-25 MED ORDER — NICOTINE 14 MG/24HR TD PT24
14.0000 mg | MEDICATED_PATCH | TRANSDERMAL | 0 refills | Status: DC
  Filled 2020-07-25: qty 14, 14d supply, fill #0

## 2020-07-25 MED ORDER — BUPRENORPHINE HCL-NALOXONE HCL 8-2 MG SL SUBL
SUBLINGUAL_TABLET | SUBLINGUAL | 1 refills | Status: DC
  Filled 2020-07-25: qty 42, 14d supply, fill #0
  Filled 2020-08-04: qty 12, 4d supply, fill #1
  Filled 2020-08-05: qty 30, 10d supply, fill #1

## 2020-07-25 MED ORDER — BUPROPION HCL ER (SR) 150 MG PO TB12
150.0000 mg | ORAL_TABLET | Freq: Two times a day (BID) | ORAL | 2 refills | Status: DC
  Filled 2020-07-25: qty 60, 30d supply, fill #0

## 2020-07-25 MED ORDER — NICOTINE 7 MG/24HR TD PT24
7.0000 mg | MEDICATED_PATCH | TRANSDERMAL | 0 refills | Status: AC
  Filled 2020-07-25: qty 14, 14d supply, fill #0

## 2020-07-25 MED ORDER — NICOTINE 21 MG/24HR TD PT24
21.0000 mg | MEDICATED_PATCH | TRANSDERMAL | 1 refills | Status: AC
  Filled 2020-07-25: qty 28, 28d supply, fill #0

## 2020-07-25 NOTE — Assessment & Plan Note (Addendum)
Patient reports his cravings are well controlled on suboxone 8-2 mg  3 times daily.  He continues to have chronic pain .  Opioid use disorder, moderate, dependence ([name]) - ToxAssure Select,+Antidepr,UR - buprenorphine-naloxone (SUBOXONE) 8-2 mg SUBL SL tablet; DISSOLVE 1 TABLET UNDER THE TONGUE IN THE MORNING, AT NOON, AND AT BEDTIME  Dispense: 42 tablet; Refill: 1

## 2020-07-25 NOTE — Assessment & Plan Note (Addendum)
Patient smokes 1 ppd x 30 years = 30 pack years. Interested in quitting. He is uninsured. He has had success on Wellbutrin in the past and likely will be more affordable. I will prescribe nicotine patches starting at 21 mg dose with step down. Patient offered follow up with PharmD and declined. He was given pamphlets for [facility].  Tobacco use Disorder  - buPROPion (WELLBUTRIN SR) 150 MG 12 hr tablet; Take 1 tablet by mouth daily for 3 days. Then take 1 tablet  by mouth 2 times daily.  Dispense: 60 tablet; Refill: 2 - nicotine (NICODERM CQ - DOSED IN MG/24 HOURS) 21 mg/24hr patch; Place 1 patch (21 mg total) onto the skin daily.  Dispense: 28 patch; Refill: 1 - nicotine (NICODERM CQ - DOSED IN MG/24 HOURS) 14 mg/24hr patch; Place 1 patch (14 mg total) onto the skin daily.  Dispense: 14 patch; Refill: 0 - nicotine (NICODERM CQ - DOSED IN MG/24 HR) 7 mg/24hr patch; Place 1 patch (7 mg total) onto the skin daily for 14 days.  Dispense: 14 patch; Refill: 0

## 2020-07-25 NOTE — Progress Notes (Signed)
Internal Medicine Clinic Attending  Case discussed with Dr. Steen  At the time of the visit.  We reviewed the resident's history and exam and pertinent patient test results.  I agree with the assessment, diagnosis, and plan of care documented in the resident's note.  

## 2020-07-25 NOTE — Patient Instructions (Signed)
1. Chest pain, unspecified type - EKG 12-Lead, normal EKG  2. Opioid use disorder, moderate, dependence ([name]) - ToxAssure Select,+Antidepr,UR  3. Dyspepsia - H. pylori antigen, stool  4. Tobacco use - buPROPion (WELLBUTRIN SR) 150 MG 12 hr tablet; Take 1 tablet (150 mg total) by mouth 2 (two) times daily. Take 1 tablet daily for 3 days. Then Take 1 tablet (150 mg total) by mouth 2 (two) times daily.  Dispense: 60 tablet; Refill: 2

## 2020-07-25 NOTE — Progress Notes (Signed)
    07/25/2020  HPI: Jabarri Stefanelli presents for follow up of opioid use disorder I have reviewed the prior induction visit, follow up visits, and telephone encounters relevant to opiate use disorder (OUD) treatment.   Current daily dose: Suboxone 8-2 mg 3 times daily  Date of Induction: 05/21/19  Current follow up interval, in weeks: 4 weeks  The patient has been adherent with the buprenorphine for OUD contract.   Last UDS Result: 05/02/2020 with buprenorphine.  Unexpectedly positive for ethylene sulfate/Glucuronide (metabolites from ethyl alcohol)  HPI: This is a 55 year old with a history of OUD who presented for follow up of his OUD.  He reports he has been adherent to Suboxone therapy which is controlling cravings well. Denies any relapse and feels he is doing well on Suboxone therapy.   Exam:   Vitals:   07/25/20 0949 07/25/20 0952  BP:  131/84  Pulse:  75  Temp:  98.2 F (36.8 C)  TempSrc:  Oral  SpO2:  97%  Weight: 213 lb (96.6 kg)    General: NAD, anxious appearing Cardiovascular: Normal rate, regular rhythm.  No murmurs, rubs, or gallops Pulmonary : Effort normal, breath sounds normal. No wheezes, rales, or rhonchi Abdominal: soft, nontender,  bowel sounds present Musculoskeletal: no swelling , deformity, injury ,or tenderness in extremities, Skin: Warm, dry , no bruising, erythema, or rash   Assessment/Plan:  See Problem Based Charting in the Encounters Tab   Tamsen Snider, MD PGY2 Internal Medicine

## 2020-07-25 NOTE — Assessment & Plan Note (Addendum)
Patient reports chronic burping, feeling overfull/bloated, and chest pain. Reports he is having chest pain during triage, EKG ordered. On exam he reports this has been going on over a year. Pain can come on at rest or with activity. It is associated with food. He reports a fundoplication 10 years ago. He also has bloating of his stomach and belching with this chest pain. Pain radiates to his back. Reviewed imaging one year ago and no sign of AAA. No weigh loss. No recent EGD or H.Pylori test.   He is also interested in smoking cessation.   EKG personally interpreted as normal sinus rythm, 61 bpm, no ischemic changes. QTc 440 ms.   Dyspepsia - H. pylori antigen, stool; Future - After testing for H.Pylori, will prescribe treatment if positive. If negative plan to trial PPI.

## 2020-07-27 ENCOUNTER — Other Ambulatory Visit (HOSPITAL_COMMUNITY): Payer: Self-pay

## 2020-07-27 ENCOUNTER — Other Ambulatory Visit: Payer: Self-pay

## 2020-07-27 DIAGNOSIS — R1013 Epigastric pain: Secondary | ICD-10-CM

## 2020-07-28 LAB — TOXASSURE SELECT,+ANTIDEPR,UR

## 2020-07-29 LAB — H. PYLORI ANTIGEN, STOOL: H pylori Ag, Stl: NEGATIVE

## 2020-07-31 NOTE — Progress Notes (Signed)
Called and discussed negative H.Pylori test. Patient is drinking 3 beers per night and I recommended alcohol cessation. I expect gastritis.  He is without insurance and EGD is cost preventative. If symptoms do not resolve patient should be referred for EGD.

## 2020-08-04 ENCOUNTER — Other Ambulatory Visit (HOSPITAL_COMMUNITY): Payer: Self-pay

## 2020-08-05 ENCOUNTER — Other Ambulatory Visit (HOSPITAL_COMMUNITY): Payer: Self-pay

## 2020-08-08 ENCOUNTER — Other Ambulatory Visit (HOSPITAL_COMMUNITY): Payer: Self-pay

## 2020-08-14 ENCOUNTER — Other Ambulatory Visit (HOSPITAL_COMMUNITY): Payer: Self-pay

## 2020-08-22 ENCOUNTER — Ambulatory Visit (INDEPENDENT_AMBULATORY_CARE_PROVIDER_SITE_OTHER): Payer: Self-pay | Admitting: Student in an Organized Health Care Education/Training Program

## 2020-08-22 ENCOUNTER — Other Ambulatory Visit (HOSPITAL_COMMUNITY): Payer: Self-pay

## 2020-08-22 VITALS — BP 166/100 | HR 70 | Temp 98.3°F | Wt 215.1 lb

## 2020-08-22 DIAGNOSIS — F1120 Opioid dependence, uncomplicated: Secondary | ICD-10-CM

## 2020-08-22 MED ORDER — BUPRENORPHINE HCL-NALOXONE HCL 8-2 MG SL SUBL
SUBLINGUAL_TABLET | SUBLINGUAL | 1 refills | Status: DC
  Filled 2020-08-22: qty 42, 14d supply, fill #0
  Filled 2020-09-04 (×2): qty 42, 14d supply, fill #1

## 2020-08-22 NOTE — Assessment & Plan Note (Signed)
Stable and doing well. Last toxassure was appropriate. Still using alcohol, but no high risk features. Database was appropriate. He is still using it mostly for its benefits with chronic pain, but I think it is also managing the OUD aspect as well. I have sent a 4 week supply of suboxone to [facility]. Follow up with [place] as televisit in 4 weeks.

## 2020-08-22 NOTE — Progress Notes (Signed)
   Assessment and Plan:  See Encounters tab for problem-based medical decision making.   __________________________________________________________  HPI:   55 year old person with chronic pain here for OBOT. He is doing well. Reports good adherence with suboxone, says it benefits his functional state, improves chronic pain. He is disabled, hasn't worked as a Development worker, community since 2014. He denies using any other drugs. He drinks alcohol on a daily basis, usually 3-4 beers nightly. Denies any falls, drowsiness, or confusion. Says it helps him sleep. Denies any issues with his other medications. He thinks the suboxone has been helpful for him, and he wants to continue with it.   __________________________________________________________  Problem List: Patient Active Problem List   Diagnosis Date Noted   Opioid use disorder, moderate, dependence ([place]) 01/22/2014    Priority: High    Class: Acute   Polyneuropathy 05/02/2020   Health care maintenance 03/06/2020   COPD (chronic obstructive pulmonary disease) ([place])    Polycythemia 10/02/2019   Lower urinary tract symptoms (LUTS) 09/29/2019   Prediabetes 09/29/2019   Depression with anxiety 05/25/2019   Frequent PVCs    Osteoarthritis of hip (Right) 04/07/2018   Spondylosis without myelopathy or radiculopathy, lumbar region 03/23/2018   Dyspepsia    Lower extremity neuropathy (Primary Area of Pain) (Bilateral) (R>L) 09/26/2016   GAD (generalized anxiety disorder) 12/06/2015   GERD (gastroesophageal reflux disease) 07/24/2015   Erectile dysfunction 09/16/2014   Chronic maxillary sinusitis 07/28/2014   Vitamin D insufficiency 07/05/2014   Long Q-T syndrome 01/23/2014   Chronic pain syndrome 12/28/2012   Alcohol use disorder, severe, dependence ([place]) 12/28/2012   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

## 2020-09-04 ENCOUNTER — Other Ambulatory Visit (HOSPITAL_COMMUNITY): Payer: Self-pay

## 2020-09-12 ENCOUNTER — Encounter: Payer: Self-pay | Admitting: *Deleted

## 2020-09-14 ENCOUNTER — Other Ambulatory Visit (HOSPITAL_COMMUNITY): Payer: Self-pay

## 2020-09-14 ENCOUNTER — Telehealth: Payer: Self-pay

## 2020-09-14 DIAGNOSIS — F1120 Opioid dependence, uncomplicated: Secondary | ICD-10-CM

## 2020-09-14 MED ORDER — BUPRENORPHINE HCL-NALOXONE HCL 8-2 MG SL SUBL
SUBLINGUAL_TABLET | SUBLINGUAL | 1 refills | Status: DC
  Filled 2020-09-14: qty 42, fill #0
  Filled 2020-09-16: qty 42, 14d supply, fill #0
  Filled 2020-09-30: qty 42, 14d supply, fill #1

## 2020-09-14 NOTE — Telephone Encounter (Signed)
Please schedule for follow up in OUD in 4 weeks

## 2020-09-14 NOTE — Telephone Encounter (Signed)
buprenorphine-naloxone (SUBOXONE) 8-2 mg SUBL SL tablet, REFILL REQUEST @  [facility] Phone:  [phone]  Fax:  [phone]     Pt would like a call back.

## 2020-09-14 NOTE — Telephone Encounter (Signed)
Returned call to patient. No answer. Left message on VM requesting return call. If patient calls back, please let him know his refill has been sent to [facility], and to please call back week of 7/25 to schedule for first week in August (OUD) or may be done with a Nurse, adult.

## 2020-09-15 ENCOUNTER — Other Ambulatory Visit (HOSPITAL_COMMUNITY): Payer: Self-pay

## 2020-09-15 ENCOUNTER — Other Ambulatory Visit: Payer: Self-pay | Admitting: Internal Medicine

## 2020-09-15 DIAGNOSIS — I10 Essential (primary) hypertension: Secondary | ICD-10-CM

## 2020-09-15 MED ORDER — METOPROLOL TARTRATE 25 MG PO TABS
ORAL_TABLET | Freq: Two times a day (BID) | ORAL | 2 refills | Status: DC
  Filled 2020-09-15: qty 60, 30d supply, fill #0
  Filled 2020-10-17: qty 60, 30d supply, fill #1
  Filled 2020-11-16: qty 60, 30d supply, fill #2

## 2020-09-16 ENCOUNTER — Other Ambulatory Visit (HOSPITAL_COMMUNITY): Payer: Self-pay

## 2020-09-19 ENCOUNTER — Other Ambulatory Visit: Payer: Self-pay | Admitting: Internal Medicine

## 2020-09-19 ENCOUNTER — Other Ambulatory Visit (HOSPITAL_COMMUNITY): Payer: Self-pay

## 2020-09-19 ENCOUNTER — Other Ambulatory Visit: Payer: Self-pay | Admitting: Student

## 2020-09-19 DIAGNOSIS — M1611 Unilateral primary osteoarthritis, right hip: Secondary | ICD-10-CM

## 2020-09-19 DIAGNOSIS — G894 Chronic pain syndrome: Secondary | ICD-10-CM

## 2020-09-19 MED ORDER — LIDOCAINE 4 % EX PTCH
1.0000 | MEDICATED_PATCH | Freq: Every day | CUTANEOUS | 0 refills | Status: DC
  Filled 2020-09-19: qty 30, fill #0

## 2020-09-19 MED FILL — Diclofenac Sodium Gel 1% (1.16% Diethylamine Equiv): CUTANEOUS | 30 days supply | Qty: 500 | Fill #0 | Status: CN

## 2020-09-20 ENCOUNTER — Other Ambulatory Visit (HOSPITAL_COMMUNITY): Payer: Self-pay

## 2020-09-29 ENCOUNTER — Other Ambulatory Visit (HOSPITAL_COMMUNITY): Payer: Self-pay

## 2020-09-30 ENCOUNTER — Other Ambulatory Visit (HOSPITAL_COMMUNITY): Payer: Self-pay

## 2020-10-17 ENCOUNTER — Other Ambulatory Visit: Payer: Self-pay | Admitting: Internal Medicine

## 2020-10-17 ENCOUNTER — Ambulatory Visit (INDEPENDENT_AMBULATORY_CARE_PROVIDER_SITE_OTHER): Payer: Self-pay | Admitting: Internal Medicine

## 2020-10-17 ENCOUNTER — Other Ambulatory Visit (HOSPITAL_COMMUNITY): Payer: Self-pay

## 2020-10-17 ENCOUNTER — Other Ambulatory Visit (HOSPITAL_COMMUNITY)
Admission: RE | Admit: 2020-10-17 | Discharge: 2020-10-17 | Disposition: A | Discharge status: Home / Self Care | Payer: Self-pay | Source: Ambulatory Visit | Attending: Internal Medicine | Admitting: Internal Medicine

## 2020-10-17 ENCOUNTER — Other Ambulatory Visit: Payer: Self-pay

## 2020-10-17 ENCOUNTER — Encounter: Payer: Self-pay | Admitting: Internal Medicine

## 2020-10-17 VITALS — BP 152/95 | HR 79 | Temp 98.0°F | Ht 76.0 in | Wt 211.5 lb

## 2020-10-17 DIAGNOSIS — F1120 Opioid dependence, uncomplicated: Secondary | ICD-10-CM

## 2020-10-17 DIAGNOSIS — R399 Unspecified symptoms and signs involving the genitourinary system: Secondary | ICD-10-CM

## 2020-10-17 DIAGNOSIS — F1020 Alcohol dependence, uncomplicated: Secondary | ICD-10-CM

## 2020-10-17 DIAGNOSIS — M792 Neuralgia and neuritis, unspecified: Secondary | ICD-10-CM

## 2020-10-17 DIAGNOSIS — R300 Dysuria: Secondary | ICD-10-CM

## 2020-10-17 DIAGNOSIS — G621 Alcoholic polyneuropathy: Secondary | ICD-10-CM

## 2020-10-17 MED ORDER — GABAPENTIN 100 MG PO CAPS
ORAL_CAPSULE | ORAL | 2 refills | Status: DC
  Filled 2020-10-17: qty 150, 30d supply, fill #0
  Filled 2020-11-16: qty 150, 30d supply, fill #1
  Filled 2020-12-18: qty 150, 30d supply, fill #2

## 2020-10-17 MED ORDER — BUPRENORPHINE HCL-NALOXONE HCL 8-2 MG SL SUBL
SUBLINGUAL_TABLET | SUBLINGUAL | 1 refills | Status: DC
Start: 2020-10-17 — End: 2020-11-14
  Filled 2020-10-17: qty 42, 14d supply, fill #0
  Filled 2020-10-30: qty 42, 14d supply, fill #1

## 2020-10-17 NOTE — Patient Instructions (Signed)
Thank you for trusting me with your care. To recap, today we discussed the following:   Neurogenic pain - gabapentin (NEURONTIN) 100 MG capsule; Take 2 capsules by mouth in the morning and take 2 capsules daily with lunch and 1 capsule  every evening.  Dispense: 150 capsule; Refill: 2  Lower urinary tract symptoms  - Urinalysis, Reflex Microscopic - PSA - GC probe amplification, urine  Opioid Use Disorder - UDS -Suboxone sent to pharmacy

## 2020-10-17 NOTE — Telephone Encounter (Signed)
buprenorphine-naloxone (SUBOXONE) 8-2 mg SUBL SL tablet, refill request @ [facility].

## 2020-10-17 NOTE — Progress Notes (Signed)
   CC: alcohol used disorder, alcoholic peripheral neuropathy,  lower urinary tract symptoms, opioid use disorder  HPI:[name] is a 55 y.o. male who presents for evaluation of alcohol used disorder, alcoholic peripheral neuropathy,  lower urinary tract symptoms, and opioid use disorder. Please see individual problem based A/P for details.   Past Medical History:  Diagnosis Date   Alcohol withdrawal ([place])    Allergy    Anxiety Dx 2014   Chronic hip pain (Right) 03/23/2018   Chronic low back pain (Secondary area of Pain) (Bilateral) (R>L) 12/28/2012   Chronic lower extremity pain (Bilateral) (R>L) 06/26/2016   Chronic musculoskeletal pain 04/15/2016   Chronic radicular low back pain (Third area of Pain) (Right) (to calf) 06/02/2014   Chronic sacroiliac joint pain (Bilateral) (R>L) 08/22/2016   Chronic sacroiliac joint pain (Left) 03/23/2018   Chronic shoulder pain (Right) 05/12/2014   [place] in March 2016 on right shoulder. 06/22/2014: [facility] [place]:  Partial full-thickness tear with minimal retraction of supraspinatus. Status post injection and referral to PT    COPD (chronic obstructive pulmonary disease) ([place])    Drug abuse ([place])    pt reports opioid dependence due to previous back surgeries   Dyspepsia    Elevated LFTs 08/15/2018   Enlarged prostate    ETOH abuse    GERD (gastroesophageal reflux disease) Dx 2003   Headache(784.0)    Hepatitis    Hyperalgesia (Lower extremities) 12/26/2016   Hyperlipidemia Dx 2000   Hypertension Dx 2011   Hypokalemia    Hyponatremia 08/15/2018   IBS (irritable bowel syndrome)    alternates constiaption/ diarrhea   Irregular heart beat    Long Q-T syndrome 01/23/2014   Mental disorder    Neuromuscular disorder ([place])    hands hurt    Neuropathy 06/07/2013   Opiate use 05/14/2016   Pain management contract broken 12/15/2017   Pain medication contract broken on 12/15/2017 and again around 05/17/2019.  The patient continues to be noncompliant with the  medication rules and regulations.  He also continues to consume alcohol and to use more medication than prescribed.    Seizure due to alcohol withdrawal ([place]) 01/23/2014   Per patient report   Shortness of breath    Withdrawal seizures ([place]) 2014   last seizure 2014 etoh with drawal   Review of Systems:   Review of Systems  Constitutional:  Negative for chills and fever.  Genitourinary:  Positive for dysuria, frequency and urgency. Negative for flank pain and hematuria.    Physical Exam: Vitals:   10/17/20 1035 10/17/20 1045  BP: (!) 168/100 (!) 152/95  Pulse: 84 79  Temp: 98 F (36.7 C)   TempSrc: Oral   SpO2: 97%   Weight: 211 lb 8 oz (95.9 kg)   Height: '6\' 4"'$  (1.93 m)     General: well appearing , NAD HEENT: Conjunctiva nl , antiicteric sclerae, Cardiovascular: Normal rate, regular rhythm.  No murmurs, rubs, or gallops Pulmonary : Equal breath sounds, No wheezes, rales, or rhonchi Abdominal: soft, nontender,  bowel sounds present Ext: No edema in lower extremities, no tenderness to palpation of lower extremities.   Assessment & Plan:   See Encounters Tab for problem based charting.  Patient discussed with Dr. Philipp Ovens

## 2020-10-18 LAB — MICROSCOPIC EXAMINATION
Bacteria, UA: NONE SEEN
Casts: NONE SEEN /lpf
Epithelial Cells (non renal): NONE SEEN /hpf (ref 0–10)
RBC, Urine: NONE SEEN /hpf (ref 0–2)

## 2020-10-18 LAB — URINALYSIS, ROUTINE W REFLEX MICROSCOPIC
Bilirubin, UA: NEGATIVE
Glucose, UA: NEGATIVE
Ketones, UA: NEGATIVE
Nitrite, UA: NEGATIVE
Protein,UA: NEGATIVE
RBC, UA: NEGATIVE
Specific Gravity, UA: 1.009 (ref 1.005–1.030)
Urobilinogen, Ur: 0.2 mg/dL (ref 0.2–1.0)
pH, UA: 6.5 (ref 5.0–7.5)

## 2020-10-18 LAB — URINE CYTOLOGY ANCILLARY ONLY
Chlamydia: NEGATIVE
Comment: NEGATIVE
Comment: NORMAL
Neisseria Gonorrhea: NEGATIVE

## 2020-10-18 LAB — PSA: Prostate Specific Ag, Serum: 2 ng/mL (ref 0.0–4.0)

## 2020-10-19 ENCOUNTER — Encounter: Payer: Self-pay | Admitting: Internal Medicine

## 2020-10-19 NOTE — Assessment & Plan Note (Addendum)
  HPI: Patient has a decreased stream and straining with urination. Has not been taking his Flomax, reminded patient this medication is for the symptoms. His PSA 9 months ago was normal. He is having perineal pain, concerning for prostatitis. Defers prostate exam. No systemic signs of infection.   Assessment/Plan: Lower urinary tract symptoms. He is uninsured and would like to avoid further imaging or referral to urology today. Will check PSA and UA. Continue Flomax.  - Urinalysis, Reflex Microscopic - PSA - Urine cytology ancillary only  Addendum: Nl PSA.Trace leukocytosis, no bacteria. Will ask patient to monitor with taking Flomax and if symptoms do not improve will have patient come back for 2 glass pre and post prostate massage test.

## 2020-10-19 NOTE — Assessment & Plan Note (Addendum)
History of OUD who presented for follow up of his OUD.  He reports he has been adherent to Suboxone therapy which is controlling cravings well. Denies any relapse and feels he is doing well on Suboxone therapy.   Assessment/Plan:Opioid use disorder, moderate, dependence ([name]) Stable and cravings are controlled on Suboxone 8-2 mg TID.Toxassure appropriate for suboxone and metabolite on 5/17.   - ToxAssure Select,+Antidepr,UR - buprenorphine-naloxone (SUBOXONE) 8-2 mg SUBL SL tablet; DISSOLVE 1 TABLEUNDER THE TONGUE IN THE MORNING, AT NOON, AND AT BEDTIME  Dispense: 42 tablet; Refill: 1

## 2020-10-19 NOTE — Assessment & Plan Note (Addendum)
Patient continues to drink alcohol.  Currently does not work. Reports no problems at home or with relationships. Continues to drink 1-2 beers per day. Discussed concern for history of dependence and alcohol induced cardiomyopathy. Patient is not interested in quitting at this point.

## 2020-10-19 NOTE — Assessment & Plan Note (Addendum)
Patient request refill for his polyneuropathy. Workup and history suggest this is secondary to alcohol use. Referral to neurology for EMG has been deferred because patient is uninsured. No further workup today.    .- gabapentin (NEURONTIN) 100 MG capsule; Take 2 capsules by mouth in the morning and take 2 capsules daily with lunch and 1 capsule  every evening.  Dispense: 150 capsule; Refill: 2

## 2020-10-20 ENCOUNTER — Other Ambulatory Visit (HOSPITAL_COMMUNITY): Payer: Self-pay

## 2020-10-20 ENCOUNTER — Other Ambulatory Visit: Payer: Self-pay | Admitting: Internal Medicine

## 2020-10-20 MED ORDER — TAMSULOSIN HCL 0.4 MG PO CAPS
0.4000 mg | ORAL_CAPSULE | Freq: Every day | ORAL | 11 refills | Status: DC
  Filled 2020-10-20: qty 30, 30d supply, fill #0

## 2020-10-20 NOTE — Progress Notes (Signed)
Patient reported he was unable to pick up Flomax. Cancelled previous order by Dr.Coe from last year and resent new prescription.

## 2020-10-21 LAB — TOXASSURE SELECT,+ANTIDEPR,UR

## 2020-10-24 ENCOUNTER — Other Ambulatory Visit: Payer: Self-pay

## 2020-10-24 NOTE — Progress Notes (Signed)
Internal Medicine Clinic Attending  Case discussed with Dr. Steen  At the time of the visit.  We reviewed the resident's history and exam and pertinent patient test results.  I agree with the assessment, diagnosis, and plan of care documented in the resident's note.  

## 2020-10-30 ENCOUNTER — Other Ambulatory Visit (HOSPITAL_COMMUNITY): Payer: Self-pay

## 2020-11-14 ENCOUNTER — Other Ambulatory Visit: Payer: Self-pay | Admitting: Internal Medicine

## 2020-11-14 ENCOUNTER — Other Ambulatory Visit (HOSPITAL_COMMUNITY): Payer: Self-pay

## 2020-11-14 DIAGNOSIS — F1120 Opioid dependence, uncomplicated: Secondary | ICD-10-CM

## 2020-11-14 MED ORDER — BUPRENORPHINE HCL-NALOXONE HCL 8-2 MG SL SUBL
SUBLINGUAL_TABLET | SUBLINGUAL | 1 refills | Status: DC
Start: 2020-11-14 — End: 2020-12-11
  Filled 2020-11-14: qty 42, 14d supply, fill #0
  Filled 2020-11-27: qty 42, 14d supply, fill #1

## 2020-11-14 NOTE — Telephone Encounter (Signed)
Refill Request   buprenorphine-naloxone (SUBOXONE) 8-2 mg SUBL SL tablet  [place] (Ph: [phone])

## 2020-11-15 ENCOUNTER — Ambulatory Visit (INDEPENDENT_AMBULATORY_CARE_PROVIDER_SITE_OTHER): Payer: Self-pay | Admitting: Internal Medicine

## 2020-11-15 DIAGNOSIS — F1120 Opioid dependence, uncomplicated: Secondary | ICD-10-CM

## 2020-11-15 NOTE — Progress Notes (Signed)
Patient presents for a telehealth appointment today for OUD and foot wound.  Considering patient has not had a tetanus shot in over 10 years and needs to be evaluated for a possible dirty wound from his lower extremity, I will reschedule the patient for a clinic appointment tomorrow.  [name], D.O.  Internal Medicine Resident, PGY-3 [facility]  Pager: [phone]

## 2020-11-16 ENCOUNTER — Encounter: Payer: Self-pay | Admitting: Internal Medicine

## 2020-11-16 ENCOUNTER — Other Ambulatory Visit: Payer: Self-pay

## 2020-11-16 ENCOUNTER — Other Ambulatory Visit (HOSPITAL_COMMUNITY): Payer: Self-pay

## 2020-11-16 ENCOUNTER — Ambulatory Visit (INDEPENDENT_AMBULATORY_CARE_PROVIDER_SITE_OTHER): Payer: Self-pay | Admitting: Internal Medicine

## 2020-11-16 VITALS — BP 158/97 | HR 86 | Temp 98.0°F | Ht 76.0 in | Wt 217.2 lb

## 2020-11-16 DIAGNOSIS — T148XXA Other injury of unspecified body region, initial encounter: Secondary | ICD-10-CM

## 2020-11-16 DIAGNOSIS — L03116 Cellulitis of left lower limb: Secondary | ICD-10-CM | POA: Insufficient documentation

## 2020-11-16 MED ORDER — CEPHALEXIN 500 MG PO CAPS
500.0000 mg | ORAL_CAPSULE | Freq: Four times a day (QID) | ORAL | 0 refills | Status: AC
  Filled 2020-11-16: qty 40, 10d supply, fill #0

## 2020-11-16 MED ORDER — TETANUS-DIPHTHERIA TOXOIDS TD 5-2 LFU IM INJ: 0.5000 mL | INJECTION | Freq: Once | INTRAMUSCULAR | Status: DC

## 2020-11-16 NOTE — Progress Notes (Signed)
CC: Foot wound  HPI:[name] is a 55 y.o. male who presents for evaluation of foot wound. Please see individual problem based A/P for details.  Please see encounters tab for problem-based charting.  Problem List Items Addressed This Visit       Musculoskeletal and Integument   Cellulitis of foot, left    Patient with a nonpurulent cellulitis of the left foot secondary to puncture wound from a nail.  He has poor sensation at baseline due to alcohol use as well as a history of prediabetes.  On physical exam left foot is grossly edematous with some erythema.  Plantar surface of left great toe has a roughly 2 cm long wound along the inferior portion at the flexor crease.  Some amount of necrotic tissue surrounding it with some early necrosis extending proximally roughly 1 cm.  No purulence or drainage noted.  No systemic signs of infection.  We will start the patient on a course of oral Keflex 500 mg 4 times daily.  Tetanus shot administered in the clinic.  Plain films of the left foot have been ordered to evaluate for possible retained foreign body as well as deep tissue abscess.  If x-rays show abscess or other complications including osteomyelitis, or if Keflex does not treat his infection patient will likely need to go to the emergency department for IV antibiotics.  Patient instructed to return to the emergency department immediately if his symptoms worsen.  Will reevaluate the patient in 4 days for improvement.       Other Visit Diagnoses     Puncture wound    -  Primary   Relevant Medications   tetanus & diphtheria toxoids (adult) (TENIVAC) injection 0.5 mL   Other Relevant Orders   DG Foot Complete Left        Depression, PHQ-9: Based on the patients  [place] Visit from 11/16/2020 in [place]  PHQ-9 Total Score 0      score we have 6.  Past Medical History:  Diagnosis Date   Alcohol withdrawal ([place])    Allergy     Anxiety Dx 2014   Chronic hip pain (Right) 03/23/2018   Chronic low back pain (Secondary area of Pain) (Bilateral) (R>L) 12/28/2012   Chronic lower extremity pain (Bilateral) (R>L) 06/26/2016   Chronic musculoskeletal pain 04/15/2016   Chronic radicular low back pain (Third area of Pain) (Right) (to calf) 06/02/2014   Chronic sacroiliac joint pain (Bilateral) (R>L) 08/22/2016   Chronic sacroiliac joint pain (Left) 03/23/2018   Chronic shoulder pain (Right) 05/12/2014   [place] in March 2016 on right shoulder. 06/22/2014: [facility] [place]:  Partial full-thickness tear with minimal retraction of supraspinatus. Status post injection and referral to PT    COPD (chronic obstructive pulmonary disease) ([place])    Drug abuse ([place])    pt reports opioid dependence due to previous back surgeries   Dyspepsia    Elevated LFTs 08/15/2018   Enlarged prostate    ETOH abuse    GERD (gastroesophageal reflux disease) Dx 2003   Headache(784.0)    Hepatitis    Hyperalgesia (Lower extremities) 12/26/2016   Hyperlipidemia Dx 2000   Hypertension Dx 2011   Hypokalemia    Hyponatremia 08/15/2018   IBS (irritable bowel syndrome)    alternates constiaption/ diarrhea   Irregular heart beat    Long Q-T syndrome 01/23/2014   Mental disorder    Neuromuscular disorder ([place])    hands hurt  Neuropathy 06/07/2013   Opiate use 05/14/2016   Pain management contract broken 12/15/2017   Pain medication contract broken on 12/15/2017 and again around 05/17/2019.  The patient continues to be noncompliant with the medication rules and regulations.  He also continues to consume alcohol and to use more medication than prescribed.    Seizure due to alcohol withdrawal ([place]) 01/23/2014   Per patient report   Shortness of breath    Withdrawal seizures ([place]) 2014   last seizure 2014 etoh with drawal   Review of Systems:   Review of Systems  Constitutional:  Negative for chills and fever.  Respiratory: Negative.    Cardiovascular: Negative.    Musculoskeletal: Negative.   Neurological:  Negative for sensory change.  Psychiatric/Behavioral: Negative.      Physical Exam: Vitals:   11/16/20 1517  BP: (!) 158/97  Pulse: 86  Temp: 98 F (36.7 C)  TempSrc: Oral  SpO2: 98%  Weight: 217 lb 3.2 oz (98.5 kg)  Height: '6\' 4"'$  (1.93 m)     General: Alert and oriented no acute distress. HEENT: Conjunctiva nl , antiicteric sclerae, moist mucous membranes, no exudate or erythema Cardiovascular: Normal rate, regular rhythm.  No murmurs, rubs, or gallops Pulmonary : Equal breath sounds, No wheezes, rales, or rhonchi Abdominal: soft, nontender,  bowel sounds present Ext: Left lower extremity with nonpurulent cellulitis of the left foot secondary to puncture wound from nail.  Left foot is grossly edematous with some erythema.  Plantar surface of left great toe has a roughly 2 cm long wound along the inferior portion of the flexor crease.  Some necrotic tissue around the wound with some early necrosis extending proximally up roughly 1 cm.  No purulence or drainage noted. Neuro: Gross sensation largely decreased in bilateral lower extremities.  Proprioception also decreased.  Assessment & Plan:   See Encounters Tab for problem based charting.  Patient seen with Dr. Daryll Drown

## 2020-11-16 NOTE — Assessment & Plan Note (Addendum)
Patient with a nonpurulent cellulitis of the left foot secondary to puncture wound from a nail.  He has poor sensation at baseline due to alcohol use as well as a history of prediabetes.  On physical exam left foot is grossly edematous with some erythema.  Plantar surface of left great toe has a roughly 2 cm long wound along the inferior portion at the flexor crease.  Some amount of necrotic tissue surrounding it with some early necrosis extending proximally roughly 1 cm.  No purulence or drainage noted.  No systemic signs of infection.  We will start the patient on a course of oral Keflex 500 mg 4 times daily.  Tetanus shot administered in the clinic.  Plain films of the left foot have been ordered to evaluate for possible retained foreign body as well as deep tissue abscess.  If x-rays show abscess or other complications including osteomyelitis, or if Keflex does not treat his infection patient will likely need to go to the emergency department for IV antibiotics.  Patient instructed to return to the emergency department immediately if his symptoms worsen.  Will reevaluate the patient in 4 days for improvement.

## 2020-11-16 NOTE — Patient Instructions (Addendum)
Dear Mark [name],  Thank you for trusting [place] with your care today.  Today we discussed the wound on your foot.  We will start you on a course of antibiotics called cephalexin 500 mg which she will take every 6 hours for 10 days.  We also administered a tetanus shot while you are here in the clinic.  We have ordered an x-ray to be done of your foot to evaluate for any deep tissue issues or retained objects.  Please get this done as quickly as possible.  Please return to the clinic this following Monday for follow-up.  If your symptoms continue to worsen or if you develop fever or chills, please head to the emergency department immediately.

## 2020-11-17 ENCOUNTER — Other Ambulatory Visit: Payer: Self-pay

## 2020-11-17 ENCOUNTER — Encounter: Payer: Self-pay | Admitting: Internal Medicine

## 2020-11-17 ENCOUNTER — Encounter (HOSPITAL_COMMUNITY): Payer: Self-pay | Admitting: Emergency Medicine

## 2020-11-17 ENCOUNTER — Emergency Department (HOSPITAL_COMMUNITY): Payer: Self-pay

## 2020-11-17 ENCOUNTER — Emergency Department (HOSPITAL_COMMUNITY)
Admission: EM | Admit: 2020-11-17 | Discharge: 2020-11-17 | Disposition: A | Discharge status: Home / Self Care | Payer: Self-pay | Attending: Emergency Medicine | Admitting: Emergency Medicine

## 2020-11-17 DIAGNOSIS — S91312A Laceration without foreign body, left foot, initial encounter: Secondary | ICD-10-CM | POA: Insufficient documentation

## 2020-11-17 DIAGNOSIS — J449 Chronic obstructive pulmonary disease, unspecified: Secondary | ICD-10-CM | POA: Insufficient documentation

## 2020-11-17 DIAGNOSIS — Z79899 Other long term (current) drug therapy: Secondary | ICD-10-CM | POA: Insufficient documentation

## 2020-11-17 DIAGNOSIS — F17210 Nicotine dependence, cigarettes, uncomplicated: Secondary | ICD-10-CM | POA: Insufficient documentation

## 2020-11-17 DIAGNOSIS — I10 Essential (primary) hypertension: Secondary | ICD-10-CM | POA: Insufficient documentation

## 2020-11-17 DIAGNOSIS — W450XXA Nail entering through skin, initial encounter: Secondary | ICD-10-CM | POA: Insufficient documentation

## 2020-11-17 NOTE — ED Provider Notes (Signed)
[facility] EMERGENCY DEPARTMENT Provider Note   CSN: [id number] Arrival date & time: 11/17/20  1228     History Chief Complaint  Patient presents with   Laceration   Foot Pain    Mark [name] is a 55 y.o. male who presents to the emergency department today for x-ray per PCP due to large laceration to the bottom of the left great toe.  Patient states he stepped on a nail 1 week ago, however he has severe neuropathy and cannot feel his feet therefore did not realize how deep the wound was.  Was seen by his primary care doctor who started him on Keflex and updated his Boostrix.  An x-ray was also ordered to evaluate for retained foreign body, however patient got confused and checked into the emergency department rather than presenting to the radiology department.  I personally read this patient's medical records.  He has history of polysubstance abuse, alcoholic peripheral neuropathy, coronary artery disease, prolonged QT.   HPI     Past Medical History:  Diagnosis Date   Alcohol withdrawal ([place])    Allergy    Anxiety Dx 2014   Chronic hip pain (Right) 03/23/2018   Chronic low back pain (Secondary area of Pain) (Bilateral) (R>L) 12/28/2012   Chronic lower extremity pain (Bilateral) (R>L) 06/26/2016   Chronic musculoskeletal pain 04/15/2016   Chronic radicular low back pain (Third area of Pain) (Right) (to calf) 06/02/2014   Chronic sacroiliac joint pain (Bilateral) (R>L) 08/22/2016   Chronic sacroiliac joint pain (Left) 03/23/2018   Chronic shoulder pain (Right) 05/12/2014   [place] in March 2016 on right shoulder. 06/22/2014: [facility] [place]:  Partial full-thickness tear with minimal retraction of supraspinatus. Status post injection and referral to PT    COPD (chronic obstructive pulmonary disease) ([place])    Drug abuse ([place])    pt reports opioid dependence due to previous back surgeries   Dyspepsia    Elevated LFTs 08/15/2018   Enlarged prostate    ETOH abuse    GERD  (gastroesophageal reflux disease) Dx 2003   Headache(784.0)    Hepatitis    Hyperalgesia (Lower extremities) 12/26/2016   Hyperlipidemia Dx 2000   Hypertension Dx 2011   Hypokalemia    Hyponatremia 08/15/2018   IBS (irritable bowel syndrome)    alternates constiaption/ diarrhea   Irregular heart beat    Long Q-T syndrome 01/23/2014   Mental disorder    Neuromuscular disorder ([place])    hands hurt    Neuropathy 06/07/2013   Opiate use 05/14/2016   Pain management contract broken 12/15/2017   Pain medication contract broken on 12/15/2017 and again around 05/17/2019.  The patient continues to be noncompliant with the medication rules and regulations.  He also continues to consume alcohol and to use more medication than prescribed.    Seizure due to alcohol withdrawal ([place]) 01/23/2014   Per patient report   Shortness of breath    Withdrawal seizures ([place]) 2014   last seizure 2014 etoh with drawal    Patient Active Problem List   Diagnosis Date Noted   Cellulitis of foot, left [id number]   Alcoholic peripheral neuropathy ([place]) 05/02/2020   Health care maintenance 03/06/2020   COPD (chronic obstructive pulmonary disease) ([place])    Polycythemia 10/02/2019   Lower urinary tract symptoms (LUTS) 09/29/2019   Prediabetes 09/29/2019   Depression with anxiety 05/25/2019   Frequent PVCs    Osteoarthritis of hip (Right) 04/07/2018   Spondylosis without myelopathy or radiculopathy, lumbar  region 03/23/2018   Dyspepsia    Lower extremity neuropathy (Primary Area of Pain) (Bilateral) (R>L) 09/26/2016   GAD (generalized anxiety disorder) 12/06/2015   GERD (gastroesophageal reflux disease) 07/24/2015   Erectile dysfunction 09/16/2014   Chronic maxillary sinusitis 07/28/2014   Vitamin D insufficiency 07/05/2014   Long Q-T syndrome 01/23/2014   Opioid use disorder, moderate, dependence ([place]) 01/22/2014    Class: Acute   Chronic pain syndrome 12/28/2012   Alcohol use disorder, severe, dependence ([place])  12/28/2012   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   Tobacco use disorder 01/19/2012   Essential hypertension    Hyperlipidemia     Past Surgical History:  Procedure Laterality Date   67 HOUR [place] STUDY N/A 01/16/2015   Procedure: 24 HOUR [place] STUDY;  Surgeon: Manus Gunning, MD;  Location: [facility];  Service: Gastroenterology;  Laterality: N/A;   [place] WITH CORONARY ANGIOGRAM N/A 01/13/2014   Procedure: LEFT HEART CATHETERIZATION WITH CORONARY ANGIOGRAM;  Surgeon: Clent Demark, MD;  Location: [facility];  Service: Cardiovascular;  Laterality: N/A;   NASAL SINUS SURGERY     NECK SURGERY  2000   cervial fusion    NISSEN FUNDOPLICATION     SHOULDER SURGERY Right    SUBACROMIAL DECOMPRESSION Right 11/16/2014   Procedure: RIGHT SHOULDER ARTHROSCOPY WITH SUBACROMIAL DECOMPRESSION ;  Surgeon: Leandrew Koyanagi, MD;  Location: [facility];  Service: Orthopedics;  Laterality: Right;   UPPER GASTROINTESTINAL ENDOSCOPY         Family History  Problem Relation Age of Onset   Lung cancer Father        was a smoker   Alcohol abuse Father    Hypertension Mother    Colon polyps Mother    Breast cancer Paternal Grandmother    Alcohol abuse Paternal Grandfather    Colon cancer Neg Hx    Rectal cancer Neg Hx    Stomach cancer Neg Hx     Social History   Tobacco Use   Smoking status: Every Day    Packs/day: 1.00    Years: 30.00    Pack years: 30.00    Types: Cigarettes   Smokeless tobacco: Never   Tobacco comments:    .5 PPD- 1ppd   Vaping Use   Vaping Use: Never used  Substance Use Topics   Alcohol use: Yes    Comment: Drinks beer.   Drug use: No    Comment: Opana    Home Medications Prior to Admission medications   Medication Sig Start Date End Date Taking? Authorizing Provider  buprenorphine-naloxone (SUBOXONE) 8-2 mg SUBL SL tablet DISSOLVE 1 TABLET UNDER THE  TONGUE IN THE MORNING, AT NOON, AND AT BEDTIME 11/14/20 05/13/21  Velna Ochs, MD  buPROPion (WELLBUTRIN SR) 150 MG 12 hr tablet Take 1 tablet by mouth daily for 3 days. Then take 1 tablet  by mouth 2 times daily. 07/25/20 07/25/21  Madalyn Rob, MD  cephALEXin (KEFLEX) 500 MG capsule Take 1 capsule (500 mg total) by mouth 4 (four) times daily for 10 days. 11/16/20 11/27/20  Delene Ruffini, MD  diclofenac Sodium (VOLTAREN) 1 % GEL APPLY 4 GRAMS TO AFFECTED AREA 4 TIMES DAILY 04/05/20 04/05/21  [name], MD  dicyclomine (BENTYL) 10 MG capsule TAKE 2 CAPSULES BY MOUTH EVERY 8 HOURS AS NEEDED FOR SPASMS 04/04/20 04/04/21  [name], MD  folic acid (FOLVITE) 1 MG tablet TAKE  1 TABLET BY MOUTH ONCE A DAY 05/02/20 05/02/21  [name], MD  gabapentin (NEURONTIN) 100 MG capsule Take 2 capsules by mouth in the morning and take 2 capsules daily with lunch and 1 capsule  every evening. 10/17/20 01/15/21  Madalyn Rob, MD  hydrochlorothiazide (HYDRODIURIL) 25 MG tablet Take 1 tablet (25 mg total) by mouth daily. 07/05/20 10/16/20  [name], MD  Lidocaine (HM LIDOCAINE PATCH) 4 % PTCH Apply 1 patch topically daily. 09/19/20   Sanjuan Dame, MD  metoprolol tartrate (LOPRESSOR) 25 MG tablet TAKE 1 TABLET BY MOUTH 2 TIMES DAILY 09/15/20 09/15/21  Sanjuan Dame, MD  nicotine (NICODERM CQ - DOSED IN MG/24 HOURS) 14 mg/24hr patch Place 1 patch (14 mg total) onto the skin daily. 09/05/20 10/31/21  Madalyn Rob, MD  rosuvastatin (CRESTOR) 20 MG tablet Take 1 tablet (20 mg total) by mouth daily. 07/06/20 07/06/21  [name], MD  tamsulosin (FLOMAX) 0.4 MG CAPS capsule Take 1 capsule  by mouth daily. 10/20/20   Madalyn Rob, MD  pantoprazole (PROTONIX) 20 MG tablet Take 1 tablet (20 mg total) by mouth daily. 12/08/19 04/05/20  Maudie Mercury, MD    Allergies    Cozaar [losartan], Librium [chlordiazepoxide], Lisinopril, and Tylenol [acetaminophen]  Review of Systems   Review of Systems  Constitutional:  Negative.   HENT: Negative.    Eyes: Negative.   Respiratory: Negative.    Cardiovascular: Negative.   Gastrointestinal: Negative.   Genitourinary: Negative.   Skin:  Positive for wound.   Physical Exam Updated Vital Signs BP (!) 158/96 (BP Location: Left Arm)   Pulse 76   Temp 98.9 F (37.2 C)   Resp 16   Ht '6\' 4"'$  (1.93 m)   Wt 98.4 kg   SpO2 97%   BMI 26.41 kg/m   Physical Exam Vitals and nursing note reviewed.  HENT:     Head: Normocephalic and atraumatic.  Eyes:     General: No scleral icterus.       Right eye: No discharge.        Left eye: No discharge.     Conjunctiva/sclera: Conjunctivae normal.  Pulmonary:     Effort: Pulmonary effort is normal.  Musculoskeletal:       Feet:  Feet:     Comments: Patient without sensation in the bottom of his feet secondary to alcoholic peripheral neuropathy.  2+ pedal pulses bilaterally. Skin:    General: Skin is warm and dry.  Neurological:     General: No focal deficit present.     Mental Status: He is alert.     Motor: Motor function is intact.     Gait: Gait is intact.  Psychiatric:        Mood and Affect: Mood normal.    ED Results / Procedures / Treatments   Labs (all labs ordered are listed, but only abnormal results are displayed) Labs Reviewed - No data to display  EKG None  Radiology DG Foot Complete Left  Result Date: 11/17/2020 CLINICAL DATA:  Foot injury, stepped on a nail a week ago, cut on big toe EXAM: LEFT FOOT - COMPLETE 3+ VIEW COMPARISON:  None. FINDINGS: There is no acute fracture or dislocation. There is no evidence of osseous destruction. Bony alignment is normal. The joint spaces are preserved. The soft tissues are unremarkable. There is no radiopaque foreign body. IMPRESSION: Unremarkable foot radiographs. Electronically Signed   By: Valetta Mole M.D.   On: 11/17/2020 13:34    Procedures Procedures  Medications Ordered in ED Medications - No data to display  ED Course  I have  reviewed the triage vital signs and the nursing notes.  Pertinent labs & imaging results that were available during my care of the patient were reviewed by me and considered in my medical decision making (see chart for details).    MDM Rules/Calculators/A&P                         55 year old male presents with concern for wound to the left foot.  Differential diagnosis includes allergy laceration, cellulitis, abscess, necrotizing soft tissue infection. Hypertensive on intake vital signs otherwise normal.  Cardiopulmonary exam is normal.  Skin exam revealed 2.5 cm laceration to the plantar surface of the left great toe at the MTP joint without signs of acute infection.  X-rays above without retained foreign body of the foot or subcutaneous gas.  No further work-up warranted in the ED at this time.  Patient continue Keflex prescription previously prescribed by PCP.  Boostrix updated by PCP as well.  Gent voiced understanding of his medical evaluation and treatment plan.  Each of his questions was answered to his expressed satisfaction.  Return precautions were given.  Patient is well-appearing, stable, and appropriate for discharge this time.  This chart was dictated using voice recognition software, Dragon. Despite the best efforts of this provider to proofread and correct errors, errors may still occur which can change documentation meaning. Final Clinical Impression(s) / ED Diagnoses Final diagnoses:  Laceration of left foot, initial encounter    Rx / DC Orders ED Discharge Orders     None        Emeline Darling, PA-C 11/17/20 1356    Lajean Saver, MD 11/17/20 1411

## 2020-11-17 NOTE — Discharge Instructions (Addendum)
You were seen in the ER today for cut on the bottom of your foot.  Your x-ray is reassuring, there is no retained foreign body in your wound.  On your exam there are no signs of infection, please continue to take the Keflex that was previously prescribed you by your primary care doctor and follow-up closely with them in the outpatient setting.  Return to the ER with any new redness, swelling, puslike discharge from the wound, fevers, chills, or any other new severe symptoms.

## 2020-11-17 NOTE — ED Triage Notes (Addendum)
Pt sent here by PCP for xray of foot. Pt cut underneath L big toe on a nail about a week ago, PCP updated tetanus but wanted an xray to make sure nothing was stuck in his foot. Hx neuropathy

## 2020-11-20 ENCOUNTER — Encounter: Payer: Self-pay | Admitting: Internal Medicine

## 2020-11-20 ENCOUNTER — Ambulatory Visit (INDEPENDENT_AMBULATORY_CARE_PROVIDER_SITE_OTHER): Payer: Self-pay | Admitting: Internal Medicine

## 2020-11-20 ENCOUNTER — Other Ambulatory Visit: Payer: Self-pay

## 2020-11-20 VITALS — BP 139/74 | HR 85 | Temp 98.3°F | Ht 76.0 in | Wt 217.0 lb

## 2020-11-20 DIAGNOSIS — H0260 Xanthelasma of unspecified eye, unspecified eyelid: Secondary | ICD-10-CM | POA: Insufficient documentation

## 2020-11-20 DIAGNOSIS — E782 Mixed hyperlipidemia: Secondary | ICD-10-CM

## 2020-11-20 DIAGNOSIS — L03116 Cellulitis of left lower limb: Secondary | ICD-10-CM

## 2020-11-20 NOTE — Progress Notes (Signed)
Internal Medicine Clinic Attending  Case discussed with Dr. Coe  At the time of the visit.  We reviewed the resident's history and exam and pertinent patient test results.  I agree with the assessment, diagnosis, and plan of care documented in the resident's note.  

## 2020-11-20 NOTE — Patient Instructions (Signed)
Be sure to complete the entire course of your antibiotic treatment for the foot wound. If you develop signs of infection, including fever, chills, swelling, pain, tenderness, redness, or drainage around the laceration, please seek re-evaluation.  I would recommend that you resume crestor (rouvastatin) for high cholesterol. Refills are available at your pharmacy.

## 2020-11-20 NOTE — Assessment & Plan Note (Signed)
Requested evaluation of bilateral eyelid lesions. Appear to be xanthomas. Reviewed his cholesterol panel which shows chronically elevated cholesterol. He has not been taking crestor. Instructed to resume crestor. Unable to afford out of pocket cost for referral to plastic surgery due to lack of insurance.

## 2020-11-20 NOTE — Progress Notes (Signed)
   Office Visit   Patient ID: Mark [name], male    DOB: 1965-04-11, 55 y.o.   MRN: [id number]   PCP: Mark Payment, MD   Subjective:  Mark [name] is a 55 y.o. year old male who presents for follow up of foot laceration. Please refer to problem based charting for assessment and plan.    Objective:   BP 139/74 (BP Location: Right Arm, Patient Position: Sitting, Cuff Size: Normal)   Pulse 85   Temp 98.3 F (36.8 C) (Oral)   Ht '6\' 4"'$  (1.93 m)   Wt 217 lb (98.4 kg)   SpO2 98%   BMI 26.41 kg/m  BP Readings from Last 3 Encounters:  11/20/20 139/74  11/17/20 (!) 152/99  11/16/20 (!) 158/97   General: well appearing, no distress Left foot: linear laceration on the plantar surface of the first digit. No surrounding erythema, edema, drainage or warmth. Laceration appears to be healing well.  Skin: bilateral raised lesions on the eyelids consistent with xanthomas.   Assessment & Plan:   Problem List Items Addressed This Visit       Musculoskeletal and Integument   Cellulitis of foot, left    No retained foreign body on films from last visit. Wound appears to be healing well.  Instructed to complete entire course of keflex. Return precautions discussed      Xanthelasma    Requested evaluation of bilateral eyelid lesions. Appear to be xanthomas. Reviewed his cholesterol panel which shows chronically elevated cholesterol. He has not been taking crestor. Instructed to resume crestor. Unable to afford out of pocket cost for referral to plastic surgery due to lack of insurance.         Other   Hyperlipidemia - Primary (Chronic)    Return in about 7 months (around 06/12/2021) for follow up with Dr. Marianna [name].   Pt discussed with Dr. Marty Heck, MD Internal Medicine Resident PGY-3 [facility] 11/20/2020 10:53 AM

## 2020-11-20 NOTE — Assessment & Plan Note (Signed)
No retained foreign body on films from last visit. Wound appears to be healing well.  Instructed to complete entire course of keflex. Return precautions discussed

## 2020-11-21 NOTE — Progress Notes (Signed)
Internal Medicine Clinic Attending  I saw and evaluated the patient.  I personally confirmed the key portions of the history and exam documented by Dr.  Elliot Gurney  and I reviewed pertinent patient test results.  The assessment, diagnosis, and plan were formulated together and I agree with the documentation in the resident's note.   Foot xray did not show any bony derangements or foreign body.  Patient should improve with keflex.  If no improvement, would recommend adding [place].

## 2020-11-27 ENCOUNTER — Other Ambulatory Visit (HOSPITAL_COMMUNITY): Payer: Self-pay

## 2020-11-27 NOTE — Progress Notes (Signed)
Internal Medicine Clinic Attending  Case discussed with Dr. Christian  At the time of the visit.  We reviewed the resident's history and exam and pertinent patient test results.  I agree with the assessment, diagnosis, and plan of care documented in the resident's note.  

## 2020-12-11 ENCOUNTER — Other Ambulatory Visit: Payer: Self-pay

## 2020-12-11 ENCOUNTER — Ambulatory Visit (INDEPENDENT_AMBULATORY_CARE_PROVIDER_SITE_OTHER): Payer: Self-pay | Admitting: Internal Medicine

## 2020-12-11 ENCOUNTER — Encounter: Payer: Self-pay | Admitting: Internal Medicine

## 2020-12-11 ENCOUNTER — Other Ambulatory Visit: Payer: Self-pay | Admitting: Internal Medicine

## 2020-12-11 ENCOUNTER — Other Ambulatory Visit (HOSPITAL_COMMUNITY): Payer: Self-pay

## 2020-12-11 VITALS — BP 157/98 | HR 72 | Temp 98.7°F | Resp 20 | Ht 76.0 in | Wt 218.2 lb

## 2020-12-11 DIAGNOSIS — Z0000 Encounter for general adult medical examination without abnormal findings: Secondary | ICD-10-CM

## 2020-12-11 DIAGNOSIS — F1120 Opioid dependence, uncomplicated: Secondary | ICD-10-CM

## 2020-12-11 DIAGNOSIS — I10 Essential (primary) hypertension: Secondary | ICD-10-CM

## 2020-12-11 DIAGNOSIS — J449 Chronic obstructive pulmonary disease, unspecified: Secondary | ICD-10-CM

## 2020-12-11 MED ORDER — ALBUTEROL SULFATE HFA 108 (90 BASE) MCG/ACT IN AERS
1.0000 | INHALATION_SPRAY | Freq: Four times a day (QID) | RESPIRATORY_TRACT | 2 refills | Status: DC | PRN
  Filled 2020-12-11: qty 8.5, 25d supply, fill #0

## 2020-12-11 MED ORDER — BUPRENORPHINE HCL-NALOXONE HCL 8-2 MG SL SUBL
SUBLINGUAL_TABLET | SUBLINGUAL | 1 refills | Status: DC
Start: 2020-12-11 — End: 2021-01-08
  Filled 2020-12-11: qty 42, 14d supply, fill #0
  Filled 2020-12-25: qty 42, 14d supply, fill #1

## 2020-12-11 NOTE — Addendum Note (Signed)
Addended by: Gilles Chiquito B on: 12/11/2020 03:07 PM   Modules accepted: Orders

## 2020-12-11 NOTE — Assessment & Plan Note (Signed)
Patient has a history of hypertension with most recent systolic blood pressure readings in the clinic of 150-160s. Patient is currently on metoprolol 25mg  twice daily and HCTZ 25mg  daily. He reports medication compliance. He intermittently checks his blood pressure at home and notes readings are mostly 120-140/80-90's. He denies any headaches, lightheadedness/dizziness, chest pain, vision changes or focal weakness.   Plan: Patient advised to keep BP log at home and bring to next visit Continue metoprolol 25mg  twice daily and HCTZ 25mg  daily  Will consider addition of ACEi/ARB at next visit if home BP readings elevated

## 2020-12-11 NOTE — Patient Instructions (Addendum)
Mr Mark [name],  It was a pleasure seeing you in clinic. Today we discussed:   Opiate use: Your suboxone refill will be sent to the [facility].  Hypertension: Your blood pressure was elevated at this visit. Please continue to take your metoprolol twice daily and hydrochlorothiazide daily. Please monitor your blood pressure daily at home and keep a record of this. Please bring this to your next visit.   COPD: Albuterol refill sent to pharmacy. If you are having to use this more than 1-2 times weekly, please let your PCP know.   If you have any questions or concerns, please call our clinic at [phone] between 9am-5pm and after hours call [phone] and ask for the internal medicine resident on call. If you feel you are having a medical emergency please call 911.   Thank you, we look forward to helping you remain healthy!

## 2020-12-11 NOTE — Assessment & Plan Note (Signed)
Patient reports long standing history of COPD that was diagnosed >6 years ago. He takes albuterol as needed for shortness of breath. He notes frequency to be about 1-2 times weekly. No recent exacerbations. He denies productive cough, chest pain or palpitations. He is requesting refill on albuterol at this time.  Plan:  Refill albuterol prn Formal PFT's once patient has insurance

## 2020-12-11 NOTE — Assessment & Plan Note (Signed)
Patient has a history of OUD presenting for follow up of his OUD and suboxone refill. He is adherent to suboxone therapy and is doing well. No cravings. Denies any relapse. PDMP and most recent UTox reviewed and appropriate.  Plan: Refill suboxone 8-2mg  tid F/u in 4 weeks

## 2020-12-11 NOTE — Progress Notes (Signed)
   CC: hypertension follow up, medication refill  HPI:  Mark [name] is a 55 y.o. male with PMHx as stated below presenting for hypertension follow up and medication refill. Please see problem based charting for complete assessment and plan.   Past Medical History:  Diagnosis Date   Alcohol withdrawal ([place])    Allergy    Anxiety Dx 2014   Chronic hip pain (Right) 03/23/2018   Chronic low back pain (Secondary area of Pain) (Bilateral) (R>L) 12/28/2012   Chronic lower extremity pain (Bilateral) (R>L) 06/26/2016   Chronic musculoskeletal pain 04/15/2016   Chronic radicular low back pain (Third area of Pain) (Right) (to calf) 06/02/2014   Chronic sacroiliac joint pain (Bilateral) (R>L) 08/22/2016   Chronic sacroiliac joint pain (Left) 03/23/2018   Chronic shoulder pain (Right) 05/12/2014   [place] in March 2016 on right shoulder. 06/22/2014: [facility] [place]:  Partial full-thickness tear with minimal retraction of supraspinatus. Status post injection and referral to PT    COPD (chronic obstructive pulmonary disease) ([place])    Drug abuse ([place])    pt reports opioid dependence due to previous back surgeries   Dyspepsia    Elevated LFTs 08/15/2018   Enlarged prostate    ETOH abuse    GERD (gastroesophageal reflux disease) Dx 2003   Headache(784.0)    Hepatitis    Hyperalgesia (Lower extremities) 12/26/2016   Hyperlipidemia Dx 2000   Hypertension Dx 2011   Hypokalemia    Hyponatremia 08/15/2018   IBS (irritable bowel syndrome)    alternates constiaption/ diarrhea   Irregular heart beat    Long Q-T syndrome 01/23/2014   Mental disorder    Neuromuscular disorder ([place])    hands hurt    Neuropathy 06/07/2013   Opiate use 05/14/2016   Pain management contract broken 12/15/2017   Pain medication contract broken on 12/15/2017 and again around 05/17/2019.  The patient continues to be noncompliant with the medication rules and regulations.  He also continues to consume alcohol and to use more medication than  prescribed.    Seizure due to alcohol withdrawal ([place]) 01/23/2014   Per patient report   Shortness of breath    Withdrawal seizures ([place]) 2014   last seizure 2014 etoh with drawal   Review of Systems:  Negative except as stated in HPI.  Physical Exam:  Vitals:   12/11/20 1317 12/11/20 1325  BP: (!) 162/100 (!) 157/98  Pulse: 77 72  Resp: 20   Temp: 98.7 F (37.1 C)   TempSrc: Oral   SpO2: 98%   Weight: 218 lb 3.2 oz (99 kg)   Height: 6\' 4"  (1.93 m)    Physical Exam   Constitutional: middle aged male, well-developed and well-nourished. No distress.  Cardiovascular: Normal rate, regular rhythm, S1 and S2 present, no murmurs, rubs, gallops.  Distal pulses intact Respiratory: No respiratory distress,  Lungs are clear to auscultation bilaterally. Musculoskeletal: Normal bulk and tone.  Neurological: Is alert and oriented x4, no apparent focal deficits noted. Psychiatric: Normal mood and affect. Behavior is normal. Judgment and thought content normal.    Assessment & Plan:   See Encounters Tab for problem based charting.  Patient discussed with Dr. Daryll [name]

## 2020-12-13 NOTE — Progress Notes (Signed)
Internal Medicine Clinic Attending ° °Case discussed with Dr. Aslam  At the time of the visit.  We reviewed the resident’s history and exam and pertinent patient test results.  I agree with the assessment, diagnosis, and plan of care documented in the resident’s note.  °

## 2020-12-14 ENCOUNTER — Emergency Department (HOSPITAL_COMMUNITY)
Admission: EM | Admit: 2020-12-14 | Discharge: 2020-12-14 | Disposition: A | Discharge status: Home / Self Care | Payer: Self-pay | Attending: Emergency Medicine | Admitting: Emergency Medicine

## 2020-12-14 ENCOUNTER — Ambulatory Visit (HOSPITAL_COMMUNITY)
Admission: RE | Admit: 2020-12-14 | Discharge: 2020-12-14 | Disposition: A | Discharge status: Home / Self Care | Payer: Self-pay | Attending: Psychiatry | Admitting: Psychiatry

## 2020-12-14 ENCOUNTER — Emergency Department (HOSPITAL_COMMUNITY): Payer: Self-pay

## 2020-12-14 ENCOUNTER — Other Ambulatory Visit: Payer: Self-pay

## 2020-12-14 ENCOUNTER — Encounter (HOSPITAL_COMMUNITY): Payer: Self-pay

## 2020-12-14 ENCOUNTER — Emergency Department (HOSPITAL_COMMUNITY)
Admission: EM | Admit: 2020-12-14 | Discharge: 2020-12-15 | Disposition: A | Discharge status: Home / Self Care | Payer: Self-pay | Attending: Emergency Medicine | Admitting: Emergency Medicine

## 2020-12-14 DIAGNOSIS — R350 Frequency of micturition: Secondary | ICD-10-CM | POA: Insufficient documentation

## 2020-12-14 DIAGNOSIS — F1010 Alcohol abuse, uncomplicated: Secondary | ICD-10-CM | POA: Insufficient documentation

## 2020-12-14 DIAGNOSIS — F17210 Nicotine dependence, cigarettes, uncomplicated: Secondary | ICD-10-CM | POA: Insufficient documentation

## 2020-12-14 DIAGNOSIS — Z5321 Procedure and treatment not carried out due to patient leaving prior to being seen by health care provider: Secondary | ICD-10-CM | POA: Insufficient documentation

## 2020-12-14 DIAGNOSIS — I10 Essential (primary) hypertension: Secondary | ICD-10-CM | POA: Insufficient documentation

## 2020-12-14 DIAGNOSIS — R0602 Shortness of breath: Secondary | ICD-10-CM | POA: Insufficient documentation

## 2020-12-14 DIAGNOSIS — Y900 Blood alcohol level of less than 20 mg/100 ml: Secondary | ICD-10-CM | POA: Insufficient documentation

## 2020-12-14 DIAGNOSIS — J449 Chronic obstructive pulmonary disease, unspecified: Secondary | ICD-10-CM | POA: Insufficient documentation

## 2020-12-14 DIAGNOSIS — F10930 Alcohol use, unspecified with withdrawal, uncomplicated: Secondary | ICD-10-CM

## 2020-12-14 DIAGNOSIS — R42 Dizziness and giddiness: Secondary | ICD-10-CM | POA: Insufficient documentation

## 2020-12-14 DIAGNOSIS — Z79899 Other long term (current) drug therapy: Secondary | ICD-10-CM | POA: Insufficient documentation

## 2020-12-14 DIAGNOSIS — R079 Chest pain, unspecified: Secondary | ICD-10-CM | POA: Insufficient documentation

## 2020-12-14 LAB — COMPREHENSIVE METABOLIC PANEL
ALT: 32 U/L (ref 0–44)
AST: 42 U/L — ABNORMAL HIGH (ref 15–41)
Albumin: 4.9 g/dL (ref 3.5–5.0)
Alkaline Phosphatase: 111 U/L (ref 38–126)
Anion gap: 12 (ref 5–15)
BUN: <5 mg/dL — ABNORMAL LOW (ref 6–20)
CO2: 31 mmol/L (ref 22–32)
Calcium: 9.6 mg/dL (ref 8.9–10.3)
Chloride: 79 mmol/L — ABNORMAL LOW (ref 98–111)
Creatinine, Ser: 0.54 mg/dL — ABNORMAL LOW (ref 0.61–1.24)
GFR, Estimated: >60 mL/min (ref >60)
Glucose, Bld: 132 mg/dL — ABNORMAL HIGH (ref 70–99)
Potassium: 3.5 mmol/L (ref 3.5–5.1)
Sodium: 122 mmol/L — ABNORMAL LOW (ref 135–145)
Total Bilirubin: 1.5 mg/dL — ABNORMAL HIGH (ref 0.3–1.2)
Total Protein: 8.1 g/dL (ref 6.5–8.1)

## 2020-12-14 LAB — URINALYSIS, ROUTINE W REFLEX MICROSCOPIC
Bacteria, UA: NONE SEEN
Bilirubin Urine: NEGATIVE
Glucose, UA: NEGATIVE mg/dL
Hgb urine dipstick: NEGATIVE
Ketones, ur: NEGATIVE mg/dL
Leukocytes,Ua: NEGATIVE
Nitrite: NEGATIVE
Protein, ur: NEGATIVE mg/dL
Specific Gravity, Urine: 1.002 — ABNORMAL LOW (ref 1.005–1.030)
pH: 6 (ref 5.0–8.0)

## 2020-12-14 LAB — BASIC METABOLIC PANEL
Anion gap: 14 (ref 5–15)
BUN: <5 mg/dL — ABNORMAL LOW (ref 6–20)
CO2: 25 mmol/L (ref 22–32)
Calcium: 9.5 mg/dL (ref 8.9–10.3)
Chloride: 87 mmol/L — ABNORMAL LOW (ref 98–111)
Creatinine, Ser: 0.54 mg/dL — ABNORMAL LOW (ref 0.61–1.24)
GFR, Estimated: >60 mL/min (ref >60)
Glucose, Bld: 122 mg/dL — ABNORMAL HIGH (ref 70–99)
Potassium: 2.7 mmol/L — CL (ref 3.5–5.1)
Sodium: 126 mmol/L — ABNORMAL LOW (ref 135–145)

## 2020-12-14 LAB — CBC
HCT: 43.5 % (ref 39.0–52.0)
Hemoglobin: 15.7 g/dL (ref 13.0–17.0)
MCH: 29.1 pg (ref 26.0–34.0)
MCHC: 36.1 g/dL — ABNORMAL HIGH (ref 30.0–36.0)
MCV: 80.7 fL (ref 80.0–100.0)
Platelets: 238 10*3/uL (ref 150–400)
RBC: 5.39 MIL/uL (ref 4.22–5.81)
RDW: 12.6 % (ref 11.5–15.5)
WBC: 8.5 10*3/uL (ref 4.0–10.5)
nRBC: 0 % (ref 0.0–0.2)

## 2020-12-14 LAB — TROPONIN I (HIGH SENSITIVITY)
Troponin I (High Sensitivity): 4 ng/L (ref <18)
Troponin I (High Sensitivity): 5 ng/L (ref <18)

## 2020-12-14 LAB — ETHANOL: Alcohol, Ethyl (B): <10 mg/dL (ref <10)

## 2020-12-14 MED ORDER — SODIUM CHLORIDE 0.9 % IV BOLUS
1000.0000 mL | Freq: Once | INTRAVENOUS | Status: AC
  Administered 2020-12-14: 1000 mL via INTRAVENOUS

## 2020-12-14 MED ORDER — LORAZEPAM 1 MG PO TABS
1.0000 mg | ORAL_TABLET | Freq: Three times a day (TID) | ORAL | 0 refills | Status: DC | PRN
  Filled 2020-12-14: qty 15, 5d supply, fill #0

## 2020-12-14 MED ORDER — LORAZEPAM 1 MG PO TABS
1.0000 mg | ORAL_TABLET | Freq: Once | ORAL | Status: AC
  Administered 2020-12-14: 1 mg via ORAL
  Filled 2020-12-14: qty 1

## 2020-12-14 MED ORDER — LORAZEPAM 2 MG/ML IJ SOLN
2.0000 mg | Freq: Once | INTRAMUSCULAR | Status: AC
  Administered 2020-12-14: 2 mg via INTRAVENOUS
  Filled 2020-12-14: qty 1

## 2020-12-14 NOTE — H&P (Addendum)
Behavioral Health Medical Screening Exam  Mark [name] is a 55 y.o. male seen by this provider as voluntary walk-in at [facility]. Patient reports he was at [facility] last night for alcohol detox, felt a little better and decided to leave.  Reports he usually drinks one case of alcohol per day, last had 2 beers a few hours ago, "I am trying to wean myself off, it is not working"  Denies suicidal ideation, no intent, no plan, denies homicidal ideation, denies auditory and visual hallucinations. Does not appear to be responding to internal or external stimuli. This patient is not experiencing a psychiatric crisis. Reports he needs detox from alcohol.   This patient is sitting in exam room looking down at floor. Reports 8/10 mid sternal chest pain, diaphoretic, nausea and vomiting reported. History of seizure activity with alcohol withdrawal in the past. He is visible shaking. He is concerned about how he is feeling.   This provider explained that he is potentially having a medical emergency and will need to go to the emergency department for specialty medical treatment. Patient agrees with this recommendation.   Total Time spent with patient: 20 minutes  Psychiatric Specialty Exam:  Presentation  General Appearance:  Appropriate for Environment Eye Contact: Fair Speech: Clear and Coherent Speech Volume: Normal Handedness: No data recorded  Mood and Affect  Mood: Euthymic Affect: Congruent  Thought Process  Thought Processes: Coherent; Goal Directed Descriptions of Associations:Intact Orientation:Full (Time, Place and Person) Thought Content:Logical History of Schizophrenia/Schizoaffective disorder:No data recorded Duration of Psychotic Symptoms:No data recorded Hallucinations:Hallucinations: None Ideas of Reference:None Suicidal Thoughts:Suicidal Thoughts: No Homicidal Thoughts:Homicidal Thoughts: No  Sensorium  Memory: Immediate Good; Recent Good; Remote  Good Judgment: Fair Insight: Fair  Community education officer  Concentration: Fair Attention Span: Fair Recall: [place] of Knowledge: Fair Language: Good  Psychomotor Activity  Psychomotor Activity: Psychomotor Activity: Normal  Assets  Assets: Desire for Improvement; Communication Skills  Sleep  Sleep: No data recorded   Physical Exam: Physical Exam Vitals reviewed.  Constitutional:      Appearance: He is ill-appearing and diaphoretic.  Cardiovascular:     Rate and Rhythm: Normal rate.  Pulmonary:     Effort: Pulmonary effort is normal.  Skin:    General: Skin is warm.  Neurological:     Mental Status: He is alert and oriented to person, place, and time.  Psychiatric:        Attention and Perception: He does not perceive auditory or visual hallucinations.        Behavior: Behavior is cooperative.        Thought Content: Thought content is not paranoid or delusional. Thought content does not include homicidal or suicidal ideation. Thought content does not include homicidal or suicidal plan.   Review of Systems  Constitutional:  Positive for diaphoresis. Negative for chills and fever.  Respiratory:  Positive for shortness of breath.   Cardiovascular:  Positive for chest pain (8/10).  Gastrointestinal:  Positive for nausea and vomiting.  Neurological:  Positive for seizures (history of with alcohol withdrawl).  Psychiatric/Behavioral:  Positive for substance abuse. Negative for suicidal ideas.   Blood pressure (!) 156/98, pulse 78, temperature 99.1 F (37.3 C), temperature source Oral, resp. rate 18, SpO2 98 %. There is no height or weight on file to calculate BMI.  Musculoskeletal: Strength & Muscle Tone: within normal limits Gait & Station: normal Patient leans: N/A   Recommendations:  Based on my evaluation the patient appears to have an emergency medical condition  for which I recommend the patient be transferred to the emergency department for further  evaluation. This patient likely needs medical admission for alcohol detox. Agrees with plan to transport to emergency department for medical clearance. Dr. Tyrone Nine notified via secure chat, charge RN notified at [facility] of this patient.  This patient does not meet inpatient psychiatric criteria, denies SI/HI/AVH.   Needs medical detox from alcohol use disorder. Laddie Aquas called 911 for emergency transport to [facility] ED. Patient being monitored while awaiting EMS arrival.  EMTALA form completed. Patient transported to ED via EMS.   Chalmers Guest, NP 12/14/2020, 5:49 PM

## 2020-12-14 NOTE — ED Triage Notes (Signed)
Ems brings pt in from behavioral health. Pt reports he wants to stop drinking alcohol. Staff states pt was having an elevated heart rate and chest pain. Pt reports generalized body pain and anxiety.

## 2020-12-14 NOTE — ED Triage Notes (Signed)
Pt complains of muscle spasms and urinary frequency since earlier yesterday. Pt states that the spasms are in his chest and back.

## 2020-12-14 NOTE — ED Notes (Signed)
Pt ambulatory with without any staff assistance.

## 2020-12-14 NOTE — ED Provider Notes (Signed)
[facility] DEPT Provider Note   CSN: [id number] Arrival date & time: 12/14/20  1813     History Chief Complaint  Patient presents with   Alcohol Problem    Mark [name] is a 55 y.o. male.  Patient here for alcohol withdrawal.  Patient was seen at behavioral health earlier today.  They got concerned about a complaint of some chest pain and that he had some tremors.  They thought he was going through serious alcohol withdrawal so he was referred in for further evaluation.  Patient states his last drink of alcohol was about 10 this morning.  Patient was actually at [facility] in the ED but left.  Sodium was 126 potassium 2.7.  Patient does take oral potassium supplements.  Glucose was 122 and his GFR was greater than 60.  States he has been a little bit with a tremor.  But he has been thinking okay.  Patient was given 2 mg of Ativan IV and felt much better.      Past Medical History:  Diagnosis Date   Alcohol withdrawal ([place])    Allergy    Anxiety Dx 2014   Chronic hip pain (Right) 03/23/2018   Chronic low back pain (Secondary area of Pain) (Bilateral) (R>L) 12/28/2012   Chronic lower extremity pain (Bilateral) (R>L) 06/26/2016   Chronic musculoskeletal pain 04/15/2016   Chronic radicular low back pain (Third area of Pain) (Right) (to calf) 06/02/2014   Chronic sacroiliac joint pain (Bilateral) (R>L) 08/22/2016   Chronic sacroiliac joint pain (Left) 03/23/2018   Chronic shoulder pain (Right) 05/12/2014   [place] in March 2016 on right shoulder. 06/22/2014: [facility] [place]:  Partial full-thickness tear with minimal retraction of supraspinatus. Status post injection and referral to PT    COPD (chronic obstructive pulmonary disease) ([place])    Drug abuse ([place])    pt reports opioid dependence due to previous back surgeries   Dyspepsia    Elevated LFTs 08/15/2018   Enlarged prostate    ETOH abuse    GERD (gastroesophageal reflux disease) Dx 2003   Headache(784.0)     Hepatitis    Hyperalgesia (Lower extremities) 12/26/2016   Hyperlipidemia Dx 2000   Hypertension Dx 2011   Hypokalemia    Hyponatremia 08/15/2018   IBS (irritable bowel syndrome)    alternates constiaption/ diarrhea   Irregular heart beat    Long Q-T syndrome 01/23/2014   Mental disorder    Neuromuscular disorder ([place])    hands hurt    Neuropathy 06/07/2013   Opiate use 05/14/2016   Pain management contract broken 12/15/2017   Pain medication contract broken on 12/15/2017 and again around 05/17/2019.  The patient continues to be noncompliant with the medication rules and regulations.  He also continues to consume alcohol and to use more medication than prescribed.    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   Health care maintenance 03/06/2020   COPD (chronic obstructive pulmonary disease) ([place])    Polycythemia 10/02/2019   Lower urinary tract symptoms (LUTS) 09/29/2019   Prediabetes 09/29/2019   Depression with anxiety 05/25/2019   Frequent PVCs    Osteoarthritis of hip (Right) 04/07/2018  Spondylosis without myelopathy or radiculopathy, lumbar region 03/23/2018   Dyspepsia    Lower extremity neuropathy (Primary Area of Pain) (Bilateral) (R>L) 09/26/2016   GAD (generalized anxiety disorder) 12/06/2015   GERD (gastroesophageal reflux disease) 07/24/2015   Erectile dysfunction 09/16/2014   Chronic maxillary sinusitis 07/28/2014   Vitamin D insufficiency 07/05/2014   Long Q-T syndrome 01/23/2014   Opioid use disorder, moderate, dependence ([place]) 01/22/2014    Class: Acute   Chronic pain syndrome 12/28/2012   Alcohol use disorder, severe, dependence ([place]) 12/28/2012   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   Tobacco use disorder 01/19/2012   Essential hypertension    Hyperlipidemia     Past Surgical History:  Procedure Laterality Date   24 HOUR [place] STUDY N/A 01/16/2015   Procedure: 24 HOUR [place] STUDY;  Surgeon: Manus Gunning, MD;  Location: [facility];  Service: Gastroenterology;  Laterality: N/A;   [place] WITH CORONARY ANGIOGRAM N/A 01/13/2014   Procedure: LEFT HEART CATHETERIZATION WITH CORONARY ANGIOGRAM;  Surgeon: Clent Demark, MD;  Location: [facility];  Service: Cardiovascular;  Laterality: N/A;   NASAL SINUS SURGERY     NECK SURGERY  2000   cervial fusion    NISSEN FUNDOPLICATION     SHOULDER SURGERY Right    SUBACROMIAL DECOMPRESSION Right 11/16/2014   Procedure: RIGHT SHOULDER ARTHROSCOPY WITH SUBACROMIAL DECOMPRESSION ;  Surgeon: Leandrew Koyanagi, MD;  Location: [facility];  Service: Orthopedics;  Laterality: Right;   UPPER GASTROINTESTINAL ENDOSCOPY         Family History  Problem Relation Age of Onset   Lung cancer Father        was a smoker   Alcohol abuse Father    Hypertension Mother    Colon polyps Mother    Breast cancer Paternal Grandmother    Alcohol abuse Paternal Grandfather    Colon cancer Neg Hx    Rectal cancer Neg Hx    Stomach cancer Neg Hx     Social History   Tobacco Use   Smoking status: Every Day    Packs/day: 1.00    Years: 30.00    Pack years: 30.00    Types: Cigarettes   Smokeless tobacco: Never   Tobacco comments:    .5 PPD- 1ppd   Vaping Use   Vaping Use: Never used  Substance Use Topics   Alcohol use: Yes    Comment: Drinks beer.   Drug use: No    Comment: Opana    Home Medications Prior to Admission medications   Medication Sig Start Date End Date Taking? Authorizing Provider  LORazepam (ATIVAN) 1 MG tablet Take 1 tablet (1 mg total) by mouth 3 (three) times daily as needed for anxiety. 12/14/20   Yes Fredia Sorrow, MD  albuterol (VENTOLIN HFA) 108 (90 Base) MCG/ACT inhaler Inhale 1-2 puffs into the lungs every 6 (six) hours as needed for wheezing or shortness of breath. 12/11/20   Harvie Heck, MD  buprenorphine-naloxone (SUBOXONE) 8-2 mg SUBL SL tablet DISSOLVE 1 TABLET UNDER THE TONGUE IN THE MORNING, AT NOON, AND AT BEDTIME 12/11/20 06/09/21  Sid Falcon, MD  buPROPion [facility] SR) 150 MG 12 hr tablet Take 1 tablet by mouth daily for 3 days. Then take 1 tablet  by mouth 2 times daily. 07/25/20 07/25/21  Madalyn Rob, MD  diclofenac Sodium (VOLTAREN) 1 % GEL APPLY 4 GRAMS TO AFFECTED AREA  4 TIMES DAILY 04/05/20 04/05/21  [name], MD  dicyclomine (BENTYL) 10 MG capsule TAKE 2 CAPSULES BY MOUTH EVERY 8 HOURS AS NEEDED FOR SPASMS 04/04/20 04/04/21  [name], MD  folic acid (FOLVITE) 1 MG tablet TAKE 1 TABLET BY MOUTH ONCE A DAY 05/02/20 05/02/21  [name], MD  gabapentin (NEURONTIN) 100 MG capsule Take 2 capsules by mouth in the morning and take 2 capsules daily with lunch and 1 capsule  every evening. 10/17/20 01/15/21  Madalyn Rob, MD  hydrochlorothiazide (HYDRODIURIL) 25 MG tablet Take 1 tablet (25 mg total) by mouth daily. 07/05/20 12/27/20  [name], MD  Lidocaine (HM LIDOCAINE PATCH) 4 % PTCH Apply 1 patch topically daily. 09/19/20   Sanjuan Dame, MD  metoprolol tartrate (LOPRESSOR) 25 MG tablet TAKE 1 TABLET BY MOUTH 2 TIMES DAILY 09/15/20 09/15/21  Sanjuan Dame, MD  nicotine (NICODERM CQ - DOSED IN MG/24 HOURS) 14 mg/24hr patch Place 1 patch (14 mg total) onto the skin daily. 09/05/20 10/31/21  Madalyn Rob, MD  rosuvastatin (CRESTOR) 20 MG tablet Take 1 tablet (20 mg total) by mouth daily. 07/06/20 07/06/21  [name], MD  tamsulosin (FLOMAX) 0.4 MG CAPS capsule Take 1 capsule  by mouth daily. 10/20/20   Madalyn Rob, MD  pantoprazole (PROTONIX) 20 MG tablet Take 1 tablet (20 mg total) by mouth daily. 12/08/19 04/05/20  Maudie Mercury, MD    Allergies    Cozaar  [losartan], Librium [chlordiazepoxide], Lisinopril, and Tylenol [acetaminophen]  Review of Systems   Review of Systems  Constitutional:  Negative for chills and fever.  HENT:  Negative for ear pain and sore throat.   Eyes:  Negative for pain and visual disturbance.  Respiratory:  Negative for cough and shortness of breath.   Cardiovascular:  Negative for chest pain and palpitations.  Gastrointestinal:  Negative for abdominal pain and vomiting.  Genitourinary:  Negative for dysuria and hematuria.  Musculoskeletal:  Negative for arthralgias and back pain.  Skin:  Negative for color change and rash.  Neurological:  Positive for tremors. Negative for seizures and syncope.  All other systems reviewed and are negative.  Physical Exam Updated Vital Signs BP 130/75   Pulse 70   Temp 98.1 F (36.7 C) (Oral)   Resp 13   Ht 1.93 m (6\' 4" )   Wt 99.8 kg   SpO2 99%   BMI 26.78 kg/m   Physical Exam Vitals and nursing note reviewed.  Constitutional:      General: He is not in acute distress.    Appearance: Normal appearance. He is well-developed.  HENT:     Head: Normocephalic and atraumatic.     Mouth/Throat:     Mouth: Mucous membranes are moist.  Eyes:     Extraocular Movements: Extraocular movements intact.     Conjunctiva/sclera: Conjunctivae normal.     Pupils: Pupils are equal, round, and reactive to light.  Cardiovascular:     Rate and Rhythm: Normal rate and regular rhythm.     Heart sounds: No murmur heard. Pulmonary:     Effort: Pulmonary effort is normal. No respiratory distress.     Breath sounds: Normal breath sounds.  Abdominal:     Palpations: Abdomen is soft.     Tenderness: There is no abdominal tenderness.  Musculoskeletal:        General: Normal range of motion.     Cervical back: Normal range of motion and neck supple.  Skin:    General: Skin is warm and dry.  Capillary Refill: Capillary refill takes less than 2 seconds.  Neurological:     General: No  focal deficit present.     Mental Status: He is alert and oriented to person, place, and time.    ED Results / Procedures / Treatments   Labs (all labs ordered are listed, but only abnormal results are displayed) Labs Reviewed  COMPREHENSIVE METABOLIC PANEL - Abnormal; Notable for the following components:      Result Value   Sodium 122 (*)    Chloride 79 (*)    Glucose, Bld 132 (*)    BUN <5 (*)    Creatinine, Ser 0.54 (*)    AST 42 (*)    Total Bilirubin 1.5 (*)    All other components within normal limits  ETHANOL    EKG EKG Interpretation  Date/Time:  Thursday December 14 2020 22:12:29 EDT Ventricular Rate:  94 PR Interval:  162 QRS Duration: 104 QT Interval:  378 QTC Calculation: 472 R Axis:   62 Text Interpretation: Normal sinus rhythm Normal ECG Confirmed by Fredia Sorrow [id number]) on 12/14/2020 10:40:36 PM  Radiology DG Chest 2 View  Result Date: 12/14/2020 CLINICAL DATA:  Chest pain and syncope. EXAM: CHEST - 2 VIEW COMPARISON:  May 19, 2019 FINDINGS: Low lung volumes are seen. Mild areas of atelectasis are seen within the bilateral lung bases. There is no evidence of a pleural effusion or pneumothorax. The heart size and mediastinal contours are within normal limits. A radiopaque fusion plate and screws are seen overlying the lower cervical spine. Multilevel degenerative changes seen throughout the thoracic spine. IMPRESSION: Low lung volumes with mild bibasilar atelectasis. Electronically Signed   By: Virgina Norfolk M.D.   On: 12/14/2020 00:42    Procedures Procedures   Medications Ordered in ED Medications  LORazepam (ATIVAN) tablet 1 mg (has no administration in time range)  LORazepam (ATIVAN) injection 2 mg (2 mg Intravenous Given 12/14/20 2130)  sodium chloride 0.9 % bolus 1,000 mL (0 mLs Intravenous Stopped 12/14/20 2312)    ED Course  I have reviewed the triage vital signs and the nursing notes.  Pertinent labs & imaging results that were  available during my care of the patient were reviewed by me and considered in my medical decision making (see chart for details).    MDM Rules/Calculators/A&P                           Patient feels much better after the Ativan.  And he wants Ativan to take at home.  We will seek outpatient help for the alcohol abuse.  And some mild withdrawal symptoms.  EKG without acute findings.  Patient had 2 troponins 1 last night 1 today both of them were normal.  There was some degree of some chest pain but none in the last several hours.  His sodium here was 123 but his potassium corrected to 3.5.  Patient received 1 L of fluid.  He can follow-up with the sodiums.  Renal function GFR was greater than 60.  Liver function test without significant abnormalities other than bilirubin being 1.5.  No anion gap.  Alcohol level was less than 10.   Final Clinical Impression(s) / ED Diagnoses Final diagnoses:  Alcohol abuse  Alcohol withdrawal syndrome without complication ([place])    Rx / DC Orders ED Discharge Orders          Ordered    LORazepam (ATIVAN) 1 MG tablet  3  times daily PRN        12/14/20 2349             Fredia Sorrow, MD 12/14/20 2353

## 2020-12-14 NOTE — BH Assessment (Signed)
Per Madison Dimple Nanas, RN), patient presented to [facility] as a walk-in. Clinician observed the [facility] reporting several medical concerns to the [facility] provider. Therefore, the [facility] provider completed the MSE, and recommended immediate medical attention. EMS called. Nurse to EDP report called.   Per [facility] provider during their interaction, no SI, HI, and/or AVH's. Only substance use reported.

## 2020-12-14 NOTE — Discharge Instructions (Addendum)
Take the Ativan as directed.  Resources provided in the community.  For follow-up due to the alcohol abuse.  Turn for any new or worse symptoms

## 2020-12-15 ENCOUNTER — Other Ambulatory Visit (HOSPITAL_COMMUNITY): Payer: Self-pay

## 2020-12-18 ENCOUNTER — Other Ambulatory Visit: Payer: Self-pay | Admitting: Student

## 2020-12-18 ENCOUNTER — Other Ambulatory Visit (HOSPITAL_COMMUNITY): Payer: Self-pay

## 2020-12-18 DIAGNOSIS — I10 Essential (primary) hypertension: Secondary | ICD-10-CM

## 2020-12-19 ENCOUNTER — Other Ambulatory Visit (HOSPITAL_COMMUNITY): Payer: Self-pay

## 2020-12-19 MED ORDER — METOPROLOL TARTRATE 25 MG PO TABS
ORAL_TABLET | Freq: Two times a day (BID) | ORAL | 2 refills | Status: DC
Start: 2020-12-19 — End: 2021-03-19
  Filled 2020-12-19: qty 60, 30d supply, fill #0
  Filled 2021-01-23: qty 60, 30d supply, fill #1
  Filled 2021-02-14: qty 60, 30d supply, fill #2

## 2020-12-25 ENCOUNTER — Other Ambulatory Visit (HOSPITAL_COMMUNITY): Payer: Self-pay

## 2020-12-28 ENCOUNTER — Telehealth: Payer: Self-pay

## 2020-12-28 NOTE — Telephone Encounter (Signed)
Received TC from patient who states he is ready to go into a rehab facility for alcohol abuse, patient is crying.  RN asked patient if he has any thoughts of harming himself, he states "no, nothing like that.  It is just time for me to get some help, I'm ready".  RN spoke with attending, Dr. Philipp Ovens who suggested pt call [facility] (pt is uninsured).    Pt was notified of MD's recommendations, phone numbers to both centers were provided and patient was very appreciative. SChaplin, RN,BSN

## 2021-01-08 ENCOUNTER — Other Ambulatory Visit (HOSPITAL_COMMUNITY): Payer: Self-pay

## 2021-01-08 ENCOUNTER — Other Ambulatory Visit: Payer: Self-pay

## 2021-01-08 ENCOUNTER — Other Ambulatory Visit: Payer: Self-pay | Admitting: Internal Medicine

## 2021-01-08 DIAGNOSIS — F1120 Opioid dependence, uncomplicated: Secondary | ICD-10-CM

## 2021-01-08 MED ORDER — BUPRENORPHINE HCL-NALOXONE HCL 8-2 MG SL SUBL
SUBLINGUAL_TABLET | SUBLINGUAL | 1 refills | Status: DC
Start: 2021-01-08 — End: 2021-02-06
  Filled 2021-01-08: qty 42, 14d supply, fill #0
  Filled 2021-01-23: qty 42, 14d supply, fill #1

## 2021-01-08 NOTE — Telephone Encounter (Signed)
buprenorphine-naloxone (SUBOXONE) 8-2 mg SUBL SL tablet, REFILL REQUEST @ [facility].

## 2021-01-08 NOTE — Telephone Encounter (Signed)
Refill Request  buprenorphine-naloxone (SUBOXONE) 8-2 mg SUBL SL tablet  [place] (Ph: [phone])

## 2021-01-09 ENCOUNTER — Other Ambulatory Visit (HOSPITAL_COMMUNITY): Payer: Self-pay

## 2021-01-10 ENCOUNTER — Encounter (HOSPITAL_COMMUNITY): Payer: Self-pay

## 2021-01-10 ENCOUNTER — Other Ambulatory Visit: Payer: Self-pay

## 2021-01-10 ENCOUNTER — Inpatient Hospital Stay (HOSPITAL_COMMUNITY)
Admission: EM | Admit: 2021-01-10 | Discharge: 2021-01-18 | DRG: 897 | Disposition: A | Discharge status: Home / Self Care | Payer: Self-pay | Attending: Internal Medicine | Admitting: Internal Medicine

## 2021-01-10 ENCOUNTER — Ambulatory Visit (INDEPENDENT_AMBULATORY_CARE_PROVIDER_SITE_OTHER): Payer: Self-pay | Admitting: Student

## 2021-01-10 ENCOUNTER — Emergency Department (HOSPITAL_COMMUNITY): Payer: Self-pay

## 2021-01-10 DIAGNOSIS — R591 Generalized enlarged lymph nodes: Secondary | ICD-10-CM | POA: Diagnosis present

## 2021-01-10 DIAGNOSIS — E871 Hypo-osmolality and hyponatremia: Secondary | ICD-10-CM

## 2021-01-10 DIAGNOSIS — E872 Acidosis, unspecified: Secondary | ICD-10-CM | POA: Diagnosis present

## 2021-01-10 DIAGNOSIS — E785 Hyperlipidemia, unspecified: Secondary | ICD-10-CM | POA: Diagnosis present

## 2021-01-10 DIAGNOSIS — I10 Essential (primary) hypertension: Secondary | ICD-10-CM | POA: Diagnosis present

## 2021-01-10 DIAGNOSIS — R739 Hyperglycemia, unspecified: Secondary | ICD-10-CM | POA: Diagnosis present

## 2021-01-10 DIAGNOSIS — R079 Chest pain, unspecified: Secondary | ICD-10-CM

## 2021-01-10 DIAGNOSIS — D72829 Elevated white blood cell count, unspecified: Secondary | ICD-10-CM | POA: Diagnosis present

## 2021-01-10 DIAGNOSIS — F17210 Nicotine dependence, cigarettes, uncomplicated: Secondary | ICD-10-CM | POA: Diagnosis present

## 2021-01-10 DIAGNOSIS — F1410 Cocaine abuse, uncomplicated: Secondary | ICD-10-CM | POA: Diagnosis present

## 2021-01-10 DIAGNOSIS — J449 Chronic obstructive pulmonary disease, unspecified: Secondary | ICD-10-CM | POA: Diagnosis present

## 2021-01-10 DIAGNOSIS — K219 Gastro-esophageal reflux disease without esophagitis: Secondary | ICD-10-CM | POA: Diagnosis present

## 2021-01-10 DIAGNOSIS — Z811 Family history of alcohol abuse and dependence: Secondary | ICD-10-CM

## 2021-01-10 DIAGNOSIS — Z5900 Homelessness unspecified: Secondary | ICD-10-CM

## 2021-01-10 DIAGNOSIS — R17 Unspecified jaundice: Secondary | ICD-10-CM | POA: Diagnosis present

## 2021-01-10 DIAGNOSIS — F411 Generalized anxiety disorder: Secondary | ICD-10-CM | POA: Diagnosis present

## 2021-01-10 DIAGNOSIS — R7303 Prediabetes: Secondary | ICD-10-CM | POA: Diagnosis present

## 2021-01-10 DIAGNOSIS — E663 Overweight: Secondary | ICD-10-CM | POA: Diagnosis present

## 2021-01-10 DIAGNOSIS — Z9114 Patient's other noncompliance with medication regimen: Secondary | ICD-10-CM

## 2021-01-10 DIAGNOSIS — Z801 Family history of malignant neoplasm of trachea, bronchus and lung: Secondary | ICD-10-CM

## 2021-01-10 DIAGNOSIS — Z79899 Other long term (current) drug therapy: Secondary | ICD-10-CM

## 2021-01-10 DIAGNOSIS — F1020 Alcohol dependence, uncomplicated: Secondary | ICD-10-CM

## 2021-01-10 DIAGNOSIS — F10939 Alcohol use, unspecified with withdrawal, unspecified: Secondary | ICD-10-CM | POA: Diagnosis present

## 2021-01-10 DIAGNOSIS — F10930 Alcohol use, unspecified with withdrawal, uncomplicated: Secondary | ICD-10-CM

## 2021-01-10 DIAGNOSIS — E876 Hypokalemia: Secondary | ICD-10-CM

## 2021-01-10 DIAGNOSIS — F1120 Opioid dependence, uncomplicated: Secondary | ICD-10-CM

## 2021-01-10 DIAGNOSIS — F10239 Alcohol dependence with withdrawal, unspecified: Principal | ICD-10-CM | POA: Diagnosis present

## 2021-01-10 DIAGNOSIS — F32A Depression, unspecified: Secondary | ICD-10-CM | POA: Diagnosis present

## 2021-01-10 DIAGNOSIS — Z8249 Family history of ischemic heart disease and other diseases of the circulatory system: Secondary | ICD-10-CM

## 2021-01-10 DIAGNOSIS — F1910 Other psychoactive substance abuse, uncomplicated: Secondary | ICD-10-CM

## 2021-01-10 DIAGNOSIS — Z20822 Contact with and (suspected) exposure to covid-19: Secondary | ICD-10-CM | POA: Diagnosis present

## 2021-01-10 LAB — TROPONIN I (HIGH SENSITIVITY)
Troponin I (High Sensitivity): 4 ng/L (ref <18)
Troponin I (High Sensitivity): 5 ng/L (ref <18)

## 2021-01-10 LAB — BASIC METABOLIC PANEL
Anion gap: 17 — ABNORMAL HIGH (ref 5–15)
BUN: <5 mg/dL — ABNORMAL LOW (ref 6–20)
CO2: 23 mmol/L (ref 22–32)
Calcium: 9.5 mg/dL (ref 8.9–10.3)
Chloride: 80 mmol/L — ABNORMAL LOW (ref 98–111)
Creatinine, Ser: 0.43 mg/dL — ABNORMAL LOW (ref 0.61–1.24)
GFR, Estimated: >60 mL/min (ref >60)
Glucose, Bld: 153 mg/dL — ABNORMAL HIGH (ref 70–99)
Potassium: 3.3 mmol/L — ABNORMAL LOW (ref 3.5–5.1)
Sodium: 120 mmol/L — ABNORMAL LOW (ref 135–145)

## 2021-01-10 LAB — CBC WITH DIFFERENTIAL/PLATELET
Abs Immature Granulocytes: 0.13 10*3/uL — ABNORMAL HIGH (ref 0.00–0.07)
Basophils Absolute: 0 10*3/uL (ref 0.0–0.1)
Basophils Relative: 0 %
Eosinophils Absolute: 0 10*3/uL (ref 0.0–0.5)
Eosinophils Relative: 0 %
HCT: 44.7 % (ref 39.0–52.0)
Hemoglobin: 16.3 g/dL (ref 13.0–17.0)
Immature Granulocytes: 1 %
Lymphocytes Relative: 11 %
Lymphs Abs: 1.3 10*3/uL (ref 0.7–4.0)
MCH: 29.6 pg (ref 26.0–34.0)
MCHC: 36.5 g/dL — ABNORMAL HIGH (ref 30.0–36.0)
MCV: 81.3 fL (ref 80.0–100.0)
Monocytes Absolute: 0.4 10*3/uL (ref 0.1–1.0)
Monocytes Relative: 4 %
Neutro Abs: 9.5 10*3/uL — ABNORMAL HIGH (ref 1.7–7.7)
Neutrophils Relative %: 84 %
Platelets: 295 10*3/uL (ref 150–400)
RBC: 5.5 MIL/uL (ref 4.22–5.81)
RDW: 12.3 % (ref 11.5–15.5)
WBC: 11.4 10*3/uL — ABNORMAL HIGH (ref 4.0–10.5)
nRBC: 0 % (ref 0.0–0.2)

## 2021-01-10 MED ORDER — THIAMINE HCL 100 MG/ML IJ SOLN
100.0000 mg | Freq: Every day | INTRAMUSCULAR | Status: DC
  Filled 2021-01-10 (×3): qty 2

## 2021-01-10 MED ORDER — HYDRALAZINE HCL 20 MG/ML IJ SOLN
5.0000 mg | Freq: Four times a day (QID) | INTRAMUSCULAR | Status: DC | PRN
  Administered 2021-01-12 – 2021-01-14 (×2): 5 mg via INTRAVENOUS
  Filled 2021-01-10 (×2): qty 1

## 2021-01-10 MED ORDER — ENOXAPARIN SODIUM 40 MG/0.4ML IJ SOSY
40.0000 mg | PREFILLED_SYRINGE | INTRAMUSCULAR | Status: DC
  Administered 2021-01-11 – 2021-01-18 (×8): 40 mg via SUBCUTANEOUS
  Filled 2021-01-10 (×8): qty 0.4

## 2021-01-10 MED ORDER — SODIUM CHLORIDE 0.9 % IV BOLUS
1000.0000 mL | Freq: Once | INTRAVENOUS | Status: AC
  Administered 2021-01-10: 1000 mL via INTRAVENOUS

## 2021-01-10 MED ORDER — SODIUM CHLORIDE 0.9 % IV SOLN: INTRAVENOUS | Status: DC

## 2021-01-10 MED ORDER — LORAZEPAM 2 MG/ML IJ SOLN: 0.0000 mg | Freq: Two times a day (BID) | INTRAMUSCULAR | Status: DC

## 2021-01-10 MED ORDER — LORAZEPAM 1 MG PO TABS: 0.0000 mg | ORAL_TABLET | Freq: Two times a day (BID) | ORAL | Status: DC

## 2021-01-10 MED ORDER — LORAZEPAM 1 MG PO TABS
0.0000 mg | ORAL_TABLET | Freq: Four times a day (QID) | ORAL | Status: DC
Start: 2021-01-10 — End: 2021-01-12
  Administered 2021-01-11 – 2021-01-12 (×3): 1 mg via ORAL
  Filled 2021-01-10 (×4): qty 1

## 2021-01-10 MED ORDER — THIAMINE HCL 100 MG PO TABS
100.0000 mg | ORAL_TABLET | Freq: Every day | ORAL | Status: DC
  Administered 2021-01-11 – 2021-01-18 (×8): 100 mg via ORAL
  Filled 2021-01-10 (×8): qty 1

## 2021-01-10 MED ORDER — POTASSIUM CHLORIDE CRYS ER 20 MEQ PO TBCR
40.0000 meq | EXTENDED_RELEASE_TABLET | Freq: Once | ORAL | Status: AC
  Administered 2021-01-11: 40 meq via ORAL
  Filled 2021-01-10: qty 2

## 2021-01-10 MED ORDER — LORAZEPAM 2 MG/ML IJ SOLN
0.0000 mg | Freq: Four times a day (QID) | INTRAMUSCULAR | Status: DC
  Administered 2021-01-10: 2 mg via INTRAVENOUS
  Administered 2021-01-11: 1 mg via INTRAVENOUS
  Administered 2021-01-12: 2 mg via INTRAVENOUS
  Filled 2021-01-10 (×3): qty 1

## 2021-01-10 MED ORDER — LORAZEPAM 2 MG/ML IJ SOLN
1.0000 mg | Freq: Once | INTRAMUSCULAR | Status: AC
  Administered 2021-01-10: 1 mg via INTRAVENOUS
  Filled 2021-01-10: qty 1

## 2021-01-10 NOTE — H&P (Signed)
History and Physical    Mark [name] FFM:[id number] DOB: December 03, 1965 DOA: 01/10/2021  PCP: [name], MD  Patient coming from: Home  I have personally briefly reviewed patient's old medical records in [place]  Chief Complaint: chest pain and wants to detox  HPI: Mark [name] is a 54 y.o. male with medical history significant for polysubstance abuse (ETOH, cocaine) on Suboxone, hypertension, cardiomegaly, COPD, hyperlipidemia, general anxiety and depression who presents with concerns of chest pain and wanting to detox from alcohol.  Patient is homeless and moves from place to place.  Today was gathering his things when he noted focal nonradiating sharp and pressure-like left-sided chest pain lasting for several minutes.  This has resolved since being in ED.  He also wants to detox from alcohol.  States last drink was this afternoon about 2 beers.  Reports drinking about 4-5 tall beers daily on and off for several years.  Has attempted detox in the past but reports he only comes to the ED for this.  Repeat denies any hallucinations but has been seeing floaters here and there.  Reports possible seizure from alcohol withdrawal 3 to 5 years ago.  ED Course: He was afebrile, hypertensive up to systolic of 570V over 779.  CBC unremarkable.  Sodium of 120, K of 3.3, chloride of 80, CO2 23, creatinine of 0.43, anion gap of 17. Troponin reassuring at 4.  Chest x-ray negative.  EKG with normal sinus rhythm and no change from prior.  Review of Systems: Constitutional: No Fever ENT/Mouth: No sore throat, No Rhinorrhea Eyes: No Eye Pain, No Vision Changes Cardiovascular: + Chest Pain, no SOB Respiratory: No Cough, No Sputum  Gastrointestinal: No Nausea, No Vomiting, No Diarrhea, No Constipation, No Pain Genitourinary: no Urinary Incontinence, No Flank Pain Musculoskeletal: No Arthralgias, No Myalgias Skin: No Skin Lesions, No Pruritus, Neuro: no Weakness, No  Numbness Psych: No Anxiety/Panic, No Depression, no decrease appetite Heme/Lymph: No Bruising, No Bleeding  Past Medical History:  Diagnosis Date   Alcohol withdrawal ([place])    Allergy    Anxiety Dx 2014   Chronic hip pain (Right) 03/23/2018   Chronic low back pain (Secondary area of Pain) (Bilateral) (R>L) 12/28/2012   Chronic lower extremity pain (Bilateral) (R>L) 06/26/2016   Chronic musculoskeletal pain 04/15/2016   Chronic radicular low back pain (Third area of Pain) (Right) (to calf) 06/02/2014   Chronic sacroiliac joint pain (Bilateral) (R>L) 08/22/2016   Chronic sacroiliac joint pain (Left) 03/23/2018   Chronic shoulder pain (Right) 05/12/2014   Fell in March 2016 on right shoulder. 06/22/2014: [facility] [place]:  Partial full-thickness tear with minimal retraction of supraspinatus. Status post injection and referral to PT    COPD (chronic obstructive pulmonary disease) ([place])    Drug abuse ([place])    pt reports opioid dependence due to previous back surgeries   Dyspepsia    Elevated LFTs 08/15/2018   Enlarged prostate    ETOH abuse    GERD (gastroesophageal reflux disease) Dx 2003   Headache(784.0)    Hepatitis    Hyperalgesia (Lower extremities) 12/26/2016   Hyperlipidemia Dx 2000   Hypertension Dx 2011   Hypokalemia    Hyponatremia 08/15/2018   IBS (irritable bowel syndrome)    alternates constiaption/ diarrhea   Irregular heart beat    Long Q-T syndrome 01/23/2014   Mental disorder    Neuromuscular disorder ([place])    hands hurt    Neuropathy 06/07/2013   Opiate use 05/14/2016   Pain management  contract broken 12/15/2017   Pain medication contract broken on 12/15/2017 and again around 05/17/2019.  The patient continues to be noncompliant with the medication rules and regulations.  He also continues to consume alcohol and to use more medication than prescribed.    Seizure due to alcohol withdrawal ([place]) 01/23/2014   Per patient report   Shortness of breath    Withdrawal seizures ([place]) 2014    last seizure 2014 etoh with drawal    Past Surgical History:  Procedure Laterality Date   39 HOUR [place] STUDY N/A 01/16/2015   Procedure: 24 HOUR PH STUDY;  Surgeon: Manus Gunning, MD;  Location: [facility];  Service: Gastroenterology;  Laterality: N/A;   [place] WITH CORONARY ANGIOGRAM N/A 01/13/2014   Procedure: LEFT HEART CATHETERIZATION WITH CORONARY ANGIOGRAM;  Surgeon: Clent Demark, MD;  Location: [facility];  Service: Cardiovascular;  Laterality: N/A;   NASAL SINUS SURGERY     NECK SURGERY  2000   cervial fusion    NISSEN FUNDOPLICATION     SHOULDER SURGERY Right    SUBACROMIAL DECOMPRESSION Right 11/16/2014   Procedure: RIGHT SHOULDER ARTHROSCOPY WITH SUBACROMIAL DECOMPRESSION ;  Surgeon: Leandrew Koyanagi, MD;  Location: [facility];  Service: Orthopedics;  Laterality: Right;   UPPER GASTROINTESTINAL ENDOSCOPY       reports that he has been smoking cigarettes. He has a 30.00 pack-year smoking history. He has never used smokeless tobacco. He reports current alcohol use. He reports that he does not use drugs. Social History  Allergies  Allergen Reactions   Cozaar [Losartan] Anaphylaxis   Librium [Chlordiazepoxide] Nausea Only and Other (See Comments)    "Tears up my guts" (SEVERE NAUSEA- COMES CLOSE TO VOMITING- SEVERE DRY HEAVES)   Lisinopril Anaphylaxis    Other reaction(s): Angioedema (ALLERGY/intolerance)   Tylenol [Acetaminophen] Other (See Comments)    CANNOT TAKE DUE TO LIVER ISSUE(S)    Family History  Problem Relation Age of Onset   Lung cancer Father        was a smoker   Alcohol abuse Father    Hypertension Mother    Colon polyps Mother    Breast cancer Paternal Grandmother    Alcohol abuse Paternal Grandfather    Colon cancer Neg Hx    Rectal cancer Neg Hx    Stomach cancer Neg Hx      Prior to Admission medications   Medication Sig Start Date End Date Taking?  Authorizing Provider  albuterol (VENTOLIN HFA) 108 (90 Base) MCG/ACT inhaler Inhale 1-2 puffs into the lungs every 6 (six) hours as needed for wheezing or shortness of breath. 12/11/20   Harvie Heck, MD  buprenorphine-naloxone (SUBOXONE) 8-2 mg SUBL SL tablet DISSOLVE 1 TABLET UNDER THE TONGUE IN THE MORNING, AT NOON, AND AT BEDTIME 01/08/21 07/07/21  Axel Filler, MD  buPROPion [facility] SR) 150 MG 12 hr tablet Take 1 tablet by mouth daily for 3 days. Then take 1 tablet  by mouth 2 times daily. 07/25/20 07/25/21  Madalyn Rob, MD  diclofenac Sodium (VOLTAREN) 1 % GEL APPLY 4 GRAMS TO AFFECTED AREA 4 TIMES DAILY 04/05/20 04/05/21  [name], MD  dicyclomine (BENTYL) 10 MG capsule TAKE 2 CAPSULES BY MOUTH EVERY 8 HOURS AS NEEDED FOR SPASMS 04/04/20 04/04/21  [name], MD  folic acid (FOLVITE) 1 MG tablet TAKE 1 TABLET BY MOUTH ONCE A DAY 05/02/20 05/02/21  [name],  MD  gabapentin (NEURONTIN) 100 MG capsule Take 2 capsules by mouth in the morning and take 2 capsules daily with lunch and 1 capsule  every evening. 10/17/20 01/18/21  Madalyn Rob, MD  hydrochlorothiazide (HYDRODIURIL) 25 MG tablet Take 1 tablet (25 mg total) by mouth daily. 07/05/20 12/27/20  [name], MD  Lidocaine (HM LIDOCAINE PATCH) 4 % PTCH Apply 1 patch topically daily. 09/19/20   Sanjuan Dame, MD  LORazepam (ATIVAN) 1 MG tablet Take 1 tablet (1 mg total) by mouth 3 (three) times daily as needed for anxiety. 12/14/20   Fredia Sorrow, MD  metoprolol tartrate (LOPRESSOR) 25 MG tablet TAKE 1 TABLET BY MOUTH 2 TIMES DAILY 12/19/20 12/19/21  [name], MD  nicotine (NICODERM CQ - DOSED IN MG/24 HOURS) 14 mg/24hr patch Place 1 patch (14 mg total) onto the skin daily. 09/05/20 10/31/21  Madalyn Rob, MD  rosuvastatin (CRESTOR) 20 MG tablet Take 1 tablet (20 mg total) by mouth daily. 07/06/20 07/06/21  [name], MD  tamsulosin (FLOMAX) 0.4 MG CAPS capsule Take 1 capsule  by mouth daily. 10/20/20   Madalyn Rob,  MD  pantoprazole (PROTONIX) 20 MG tablet Take 1 tablet (20 mg total) by mouth daily. 12/08/19 04/05/20  Maudie Mercury, MD    Physical Exam: Vitals:   01/10/21 2030 01/10/21 2100 01/10/21 2130 01/10/21 2240  BP: (!) 172/95 (!) 168/99 (!) 150/91 (!) 160/94  Pulse: 86 85 89 85  Resp: 14 11 12    Temp:      TempSrc:      SpO2: 93% 92% 91%   Weight:      Height:        Constitutional: NAD, calm, comfortable, nontoxic appearing male sitting up in bed eating sandwich Vitals:   01/10/21 2030 01/10/21 2100 01/10/21 2130 01/10/21 2240  BP: (!) 172/95 (!) 168/99 (!) 150/91 (!) 160/94  Pulse: 86 85 89 85  Resp: 14 11 12    Temp:      TempSrc:      SpO2: 93% 92% 91%   Weight:      Height:       Eyes: PERRL, lids and conjunctivae normal ENMT: Mucous membranes are moist.  Neck: normal, supple Respiratory: clear to auscultation bilaterally, no wheezing, no crackles. Normal respiratory effort. No accessory muscle use.  Cardiovascular: Regular rate and rhythm, no murmurs / rubs / gallops. No extremity edema. Abdomen: no tenderness, no masses palpated.  Bowel sounds positive.  Musculoskeletal: no clubbing / cyanosis. No joint deformity upper and lower extremities. Good ROM, no contractures. Normal muscle tone.  Skin: no rashes, lesions, ulcers. No induration Neurologic: CN 2-12 grossly intact.  No tremors.  Strength 5/5 in all 4.  Psychiatric: Patient at times appears to have some tangential thoughts and answers sometimes do not correlate with question.  Alert and oriented x 3. Normal mood.     Labs on Admission: I have personally reviewed following labs and imaging studies  CBC: Recent Labs  Lab 01/10/21 1746  WBC 11.4*  NEUTROABS 9.5*  HGB 16.3  HCT 44.7  MCV 81.3  PLT 413   Basic Metabolic Panel: Recent Labs  Lab 01/10/21 1746  NA 120*  K 3.3*  CL 80*  CO2 23  GLUCOSE 153*  BUN <5*  CREATININE 0.43*  CALCIUM 9.5   GFR: Estimated Creatinine Clearance: 128.1 mL/min (A)  (by C-G formula based on SCr of 0.43 mg/dL (L)). Liver Function Tests: No results for input(s): AST, ALT, ALKPHOS, BILITOT, PROT, ALBUMIN in the  last 168 hours. No results for input(s): LIPASE, AMYLASE in the last 168 hours. No results for input(s): AMMONIA in the last 168 hours. Coagulation Profile: No results for input(s): INR, PROTIME in the last 168 hours. Cardiac Enzymes: No results for input(s): CKTOTAL, CKMB, CKMBINDEX, TROPONINI in the last 168 hours. BNP (last 3 results) No results for input(s): PROBNP in the last 8760 hours. HbA1C: No results for input(s): HGBA1C in the last 72 hours. CBG: No results for input(s): GLUCAP in the last 168 hours. Lipid Profile: No results for input(s): CHOL, HDL, LDLCALC, TRIG, CHOLHDL, LDLDIRECT in the last 72 hours. Thyroid Function Tests: No results for input(s): TSH, T4TOTAL, FREET4, T3FREE, THYROIDAB in the last 72 hours. Anemia Panel: No results for input(s): VITAMINB12, FOLATE, FERRITIN, TIBC, IRON, RETICCTPCT in the last 72 hours. Urine analysis:    Component Value Date/Time   COLORURINE COLORLESS (A) 12/14/2020 0109   APPEARANCEUR CLEAR 12/14/2020 0109   APPEARANCEUR Clear 10/17/2020 1120   LABSPEC 1.002 (L) 12/14/2020 0109   PHURINE 6.0 12/14/2020 0109   GLUCOSEU NEGATIVE 12/14/2020 0109   HGBUR NEGATIVE 12/14/2020 0109   BILIRUBINUR NEGATIVE 12/14/2020 0109   BILIRUBINUR Negative 10/17/2020 1120   KETONESUR NEGATIVE 12/14/2020 0109   PROTEINUR NEGATIVE 12/14/2020 0109   UROBILINOGEN 0.2 11/10/2019 1122   UROBILINOGEN 1.0 01/20/2013 1900   NITRITE NEGATIVE 12/14/2020 0109   LEUKOCYTESUR NEGATIVE 12/14/2020 0109    Radiological Exams on Admission: No results found.    Assessment/Plan Hyponatremia - Most likely secondary to heavy alcohol use -Admitted on sodium of 120.  This was normal 6 months ago 139 -Has received 1 L normal saline bolus started on continuous IV 100 cc/h fluid in the ED -Follow with repeat sodium  now and in the morning.  Avoid overcorrection of greater than 8 meq in 24 hrs  Hypokalemia - Replete with oral K  Chest pain - Troponin at 4 and EKG reassuring.  Symptoms have resolved in the ED.  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  Last echo on 02/2021 with a EF of 50 to 55% -Repeat echo in the morning  Heavy alcohol use -last beer this afternoon. No signs of withdrawal -place on CIWA protocol  Metabolic acidosis - Secondary to heavy alcohol use - Chloride of 80, CO2 23, gap of 17 - Monitor with repeat labs in the morning following continuous IV fluids  Polysubstance abuse - Continue Suboxone TID-I have verified this on the [place] substance abuse database  HTN Uncontrolled-Pt reports she has not been taking any antihypertensives IV hydralazine for BP over 180/100  Level of care: Progressive  Status is: Observation  The patient remains OBS appropriate and will d/c before 2 midnights.        Orene Desanctis DO [facility]   If 7PM-7AM, please contact night-coverage [email or web address]   01/10/2021, 10:45 PM

## 2021-01-10 NOTE — ED Provider Notes (Signed)
[facility] DEPT Provider Note   CSN: [id number] Arrival date & time: 01/10/21  1132     History Chief Complaint  Patient presents with   Chest Pain    Jamarkis Branam is a 55 y.o. male.  Patient concerned about alcohol withdrawal.  Patient last had something to drink last evening.  He normally drinks a case of beer a day.  Patient also was with a complaint of some chest pain.  He was here this morning he left he went home and drank again several beers at 1400.  This pain was substernal.  Somewhat constant.  Patient denies any nausea or vomiting.  Past medical history significant for chronic pain.  Alcohol withdrawal drug abuse.  Patient denies any current use.      Past Medical History:  Diagnosis Date   Alcohol withdrawal ([place])    Allergy    Anxiety Dx 2014   Chronic hip pain (Right) 03/23/2018   Chronic low back pain (Secondary area of Pain) (Bilateral) (R>L) 12/28/2012   Chronic lower extremity pain (Bilateral) (R>L) 06/26/2016   Chronic musculoskeletal pain 04/15/2016   Chronic radicular low back pain (Third area of Pain) (Right) (to calf) 06/02/2014   Chronic sacroiliac joint pain (Bilateral) (R>L) 08/22/2016   Chronic sacroiliac joint pain (Left) 03/23/2018   Chronic shoulder pain (Right) 05/12/2014   [place] in March 2016 on right shoulder. 06/22/2014: [facility] [place]:  Partial full-thickness tear with minimal retraction of supraspinatus. Status post injection and referral to PT    COPD (chronic obstructive pulmonary disease) ([place])    Drug abuse ([place])    pt reports opioid dependence due to previous back surgeries   Dyspepsia    Elevated LFTs 08/15/2018   Enlarged prostate    ETOH abuse    GERD (gastroesophageal reflux disease) Dx 2003   Headache(784.0)    Hepatitis    Hyperalgesia (Lower extremities) 12/26/2016   Hyperlipidemia Dx 2000   Hypertension Dx 2011   Hypokalemia    Hyponatremia 08/15/2018   IBS (irritable bowel syndrome)    alternates  constiaption/ diarrhea   Irregular heart beat    Long Q-T syndrome 01/23/2014   Mental disorder    Neuromuscular disorder ([place])    hands hurt    Neuropathy 06/07/2013   Opiate use 05/14/2016   Pain management contract broken 12/15/2017   Pain medication contract broken on 12/15/2017 and again around 05/17/2019.  The patient continues to be noncompliant with the medication rules and regulations.  He also continues to consume alcohol and to use more medication than prescribed.    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   Health care maintenance 03/06/2020   COPD (chronic obstructive pulmonary disease) ([place])    Polycythemia 10/02/2019   Lower urinary tract symptoms (LUTS) 09/29/2019   Prediabetes 09/29/2019   Depression with anxiety 05/25/2019   Frequent PVCs    Osteoarthritis of hip (Right) 04/07/2018   Spondylosis without myelopathy or radiculopathy, lumbar region 03/23/2018   Dyspepsia    Lower extremity neuropathy (Primary Area of Pain) (Bilateral) (R>L) 09/26/2016   GAD (generalized anxiety disorder) 12/06/2015   GERD (gastroesophageal reflux disease) 07/24/2015   Erectile dysfunction  09/16/2014   Chronic maxillary sinusitis 07/28/2014   Vitamin D insufficiency 07/05/2014   Long Q-T syndrome 01/23/2014   Opioid use disorder, moderate, dependence ([place]) 01/22/2014    Class: Acute   Chronic pain syndrome 12/28/2012   Alcohol use disorder, severe, dependence ([place]) 12/28/2012   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   Tobacco use disorder 01/19/2012   Essential hypertension    Hyperlipidemia     Past Surgical History:  Procedure Laterality Date   24 HOUR [place] STUDY N/A  01/16/2015   Procedure: 24 HOUR [place] STUDY;  Surgeon: Manus Gunning, MD;  Location: [facility];  Service: Gastroenterology;  Laterality: N/A;   [place] WITH CORONARY ANGIOGRAM N/A 01/13/2014   Procedure: LEFT HEART CATHETERIZATION WITH CORONARY ANGIOGRAM;  Surgeon: Clent Demark, MD;  Location: [facility];  Service: Cardiovascular;  Laterality: N/A;   NASAL SINUS SURGERY     NECK SURGERY  2000   cervial fusion    NISSEN FUNDOPLICATION     SHOULDER SURGERY Right    SUBACROMIAL DECOMPRESSION Right 11/16/2014   Procedure: RIGHT SHOULDER ARTHROSCOPY WITH SUBACROMIAL DECOMPRESSION ;  Surgeon: Leandrew Koyanagi, MD;  Location: [facility];  Service: Orthopedics;  Laterality: Right;   UPPER GASTROINTESTINAL ENDOSCOPY         Family History  Problem Relation Age of Onset   Lung cancer Father        was a smoker   Alcohol abuse Father    Hypertension Mother    Colon polyps Mother    Breast cancer Paternal Grandmother    Alcohol abuse Paternal Grandfather    Colon cancer Neg Hx    Rectal cancer Neg Hx    Stomach cancer Neg Hx     Social History   Tobacco Use   Smoking status: Every Day    Packs/day: 1.00    Years: 30.00    Pack years: 30.00    Types: Cigarettes   Smokeless tobacco: Never   Tobacco comments:    .5 PPD- 1ppd   Vaping Use   Vaping Use: Never used  Substance Use Topics   Alcohol use: Yes    Comment: Drinks beer.   Drug use: No    Comment: Opana    Home Medications Prior to Admission medications   Medication Sig Start Date End Date Taking? Authorizing Provider  albuterol (VENTOLIN HFA) 108 (90 Base) MCG/ACT inhaler Inhale 1-2 puffs into the lungs every 6 (six) hours as needed for wheezing or shortness of breath. 12/11/20   Harvie Heck, MD  buprenorphine-naloxone (SUBOXONE) 8-2 mg SUBL SL tablet DISSOLVE 1 TABLET UNDER THE TONGUE IN THE MORNING, AT NOON, AND AT BEDTIME 01/08/21  07/07/21  Axel Filler, MD  buPROPion [facility] SR) 150 MG 12 hr tablet Take 1 tablet by mouth daily for 3 days. Then take 1 tablet  by mouth 2 times daily. 07/25/20 07/25/21  Madalyn Rob, MD  diclofenac Sodium (VOLTAREN) 1 % GEL APPLY 4 GRAMS TO AFFECTED AREA 4 TIMES DAILY 04/05/20 04/05/21  [name], MD  dicyclomine (BENTYL) 10 MG capsule TAKE 2 CAPSULES BY MOUTH EVERY 8 HOURS AS NEEDED FOR SPASMS 04/04/20 04/04/21  [name], MD  folic acid (FOLVITE) 1 MG tablet TAKE 1 TABLET BY MOUTH ONCE A DAY 05/02/20 05/02/21  [name], MD  gabapentin (NEURONTIN) 100 MG capsule Take 2 capsules by mouth in the morning and take  2 capsules daily with lunch and 1 capsule  every evening. 10/17/20 01/18/21  Madalyn Rob, MD  hydrochlorothiazide (HYDRODIURIL) 25 MG tablet Take 1 tablet (25 mg total) by mouth daily. 07/05/20 12/27/20  [name], MD  Lidocaine (HM LIDOCAINE PATCH) 4 % PTCH Apply 1 patch topically daily. 09/19/20   Sanjuan Dame, MD  LORazepam (ATIVAN) 1 MG tablet Take 1 tablet (1 mg total) by mouth 3 (three) times daily as needed for anxiety. 12/14/20   Fredia Sorrow, MD  metoprolol tartrate (LOPRESSOR) 25 MG tablet TAKE 1 TABLET BY MOUTH 2 TIMES DAILY 12/19/20 12/19/21  [name], MD  nicotine (NICODERM CQ - DOSED IN MG/24 HOURS) 14 mg/24hr patch Place 1 patch (14 mg total) onto the skin daily. 09/05/20 10/31/21  Madalyn Rob, MD  rosuvastatin (CRESTOR) 20 MG tablet Take 1 tablet (20 mg total) by mouth daily. 07/06/20 07/06/21  [name], MD  tamsulosin (FLOMAX) 0.4 MG CAPS capsule Take 1 capsule  by mouth daily. 10/20/20   Madalyn Rob, MD  pantoprazole (PROTONIX) 20 MG tablet Take 1 tablet (20 mg total) by mouth daily. 12/08/19 04/05/20  Maudie Mercury, MD    Allergies    Cozaar [losartan], Librium [chlordiazepoxide], Lisinopril, and Tylenol [acetaminophen]  Review of Systems   Review of Systems  Constitutional:  Negative for chills and fever.  HENT:  Negative for  ear pain and sore throat.   Eyes:  Negative for pain and visual disturbance.  Respiratory:  Negative for cough and shortness of breath.   Cardiovascular:  Positive for chest pain. Negative for palpitations.  Gastrointestinal:  Negative for abdominal pain and vomiting.  Genitourinary:  Negative for dysuria and hematuria.  Musculoskeletal:  Negative for arthralgias and back pain.  Skin:  Negative for color change and rash.  Neurological:  Positive for tremors. Negative for seizures and syncope.  All other systems reviewed and are negative.  Physical Exam Updated Vital Signs BP (!) 150/91   Pulse 89   Temp 97.7 F (36.5 C) (Oral)   Resp 12   Ht 1.93 m (6\' 4" )   Wt 99.8 kg   SpO2 91%   BMI 26.78 kg/m   Physical Exam Vitals and nursing note reviewed.  Constitutional:      General: He is in acute distress.     Appearance: He is well-developed.  HENT:     Head: Normocephalic and atraumatic.  Eyes:     Extraocular Movements: Extraocular movements intact.     Conjunctiva/sclera: Conjunctivae normal.     Pupils: Pupils are equal, round, and reactive to light.  Cardiovascular:     Rate and Rhythm: Normal rate and regular rhythm.     Heart sounds: No murmur heard. Pulmonary:     Effort: Pulmonary effort is normal. No respiratory distress.     Breath sounds: Normal breath sounds.  Chest:     Chest wall: No tenderness.  Abdominal:     Palpations: Abdomen is soft.     Tenderness: There is no abdominal tenderness.  Musculoskeletal:        General: No swelling.     Cervical back: Normal range of motion and neck supple.  Skin:    General: Skin is warm and dry.  Neurological:     General: No focal deficit present.     Mental Status: He is alert and oriented to person, place, and time.     Cranial Nerves: No cranial nerve deficit.     Sensory: No sensory deficit.  Motor: No weakness.     Comments: Patient with pretty significant tremors.    ED Results / Procedures /  Treatments   Labs (all labs ordered are listed, but only abnormal results are displayed) Labs Reviewed  CBC WITH DIFFERENTIAL/PLATELET - Abnormal; Notable for the following components:      Result Value   WBC 11.4 (*)    MCHC 36.5 (*)    Neutro Abs 9.5 (*)    Abs Immature Granulocytes 0.13 (*)    All other components within normal limits  BASIC METABOLIC PANEL - Abnormal; Notable for the following components:   Sodium 120 (*)    Potassium 3.3 (*)    Chloride 80 (*)    Glucose, Bld 153 (*)    BUN <5 (*)    Creatinine, Ser 0.43 (*)    Anion gap 17 (*)    All other components within normal limits  TROPONIN I (HIGH SENSITIVITY)  TROPONIN I (HIGH SENSITIVITY)    EKG EKG Interpretation  Date/Time:  Wednesday January 10 2021 17:35:42 EDT Ventricular Rate:  81 PR Interval:  179 QRS Duration: 118 QT Interval:  410 QTC Calculation: 476 R Axis:   33 Text Interpretation: Sinus rhythm Consider left atrial enlargement Nonspecific intraventricular conduction delay No significant change since last tracing Confirmed by Fredia Sorrow [phone]) on 01/10/2021 5:51:39 PM  Radiology No results found.  Procedures Procedures   Medications Ordered in ED Medications - No data to display  ED Course  I have reviewed the triage vital signs and the nursing notes.  Pertinent labs & imaging results that were available during my care of the patient were reviewed by me and considered in my medical decision making (see chart for details).    MDM Rules/Calculators/A&P                         CRITICAL CARE Performed by: Fredia Sorrow Total critical care time: 45 minutes Critical care time was exclusive of separately billable procedures and treating other patients. Critical care was necessary to treat or prevent imminent or life-threatening deterioration. Critical care was time spent personally by me on the following activities: development of treatment plan with patient and/or surrogate as  well as nursing, discussions with consultants, evaluation of patient's response to treatment, examination of patient, obtaining history from patient or surrogate, ordering and performing treatments and interventions, ordering and review of laboratory studies, ordering and review of radiographic studies, pulse oximetry and re-evaluation of patient's condition.  Patient with pretty significant tremor.  Patient is going through some alcohol withdrawal.  Not showing any significant delirium.  We will put on CIWA protocol.  Also has hyponatremia.  Patient will receive a liter of normal saline.  Started on saline drip.  Contact hospitalist for admission.  From a heart standpoint troponins x2 are normal.  Chest x-ray without any acute findings.  CBC has a little bit of leukocytosis.  Metabolic panel significant for sodium of 120.  Some of that is related to the glucose of 153.  Patient will need hepatic panel and lipase.   Final Clinical Impression(s) / ED Diagnoses Final diagnoses:  None    Rx / DC Orders ED Discharge Orders     None        Fredia Sorrow, MD 01/10/21 2239

## 2021-01-10 NOTE — ED Triage Notes (Signed)
Pt presents with c/o chest pain that started last night. Pt reports that he is going though alcohol withdrawals, last drink today. Pt reports he normally drinks every day.

## 2021-01-10 NOTE — ED Notes (Signed)
Per patient, states he left around 2 pm due to wait-called EMS to bring him back to ED-states he needs detox and complaining of chest pain-

## 2021-01-10 NOTE — ED Notes (Signed)
Pt care taken, complaining of going through withdrawals

## 2021-01-10 NOTE — Progress Notes (Signed)
  [facility] Telephone Encounter Continuity Care Appointment  HPI:  This telephone encounter was created for [name] on 01/10/2021 for the following purpose/cc OUD clinic, concerns for EtOH abuse.   Past Medical History:  Past Medical History:  Diagnosis Date   Alcohol withdrawal ([place])    Allergy    Anxiety Dx 2014   Chronic hip pain (Right) 03/23/2018   Chronic low back pain (Secondary area of Pain) (Bilateral) (R>L) 12/28/2012   Chronic lower extremity pain (Bilateral) (R>L) 06/26/2016   Chronic musculoskeletal pain 04/15/2016   Chronic radicular low back pain (Third area of Pain) (Right) (to calf) 06/02/2014   Chronic sacroiliac joint pain (Bilateral) (R>L) 08/22/2016   Chronic sacroiliac joint pain (Left) 03/23/2018   Chronic shoulder pain (Right) 05/12/2014   [place] in March 2016 on right shoulder. 06/22/2014: [facility] [place]:  Partial full-thickness tear with minimal retraction of supraspinatus. 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  The patient continues to be noncompliant with the medication rules and regulations.  He also continues to consume alcohol and to use more  medication than prescribed.    Seizure due to alcohol withdrawal ([place]) 01/23/2014   Per patient report   Shortness of breath    Withdrawal seizures ([place]) 2014   last seizure 2014 etoh with drawal     ROS:  As per HPI   Assessment / Plan / Recommendations:  Please see A&P under problem oriented charting for assessment of the patient's acute and chronic medical conditions.  As always, pt is advised that if symptoms worsen or new symptoms arise, they should go to an urgent care facility or to to ER for further evaluation.   Consent and Medical Decision Making:  Patient discussed with Dr. Evette Doffing This is a telephone encounter between [name] and Mark [name] on 01/10/2021 for OUD/AUD. The visit was conducted with the patient located at home and Mark [name] at [facility]. The patient's identity was confirmed using their DOB and current address. The patient has consented to being evaluated through a telephone encounter and understands the associated risks (an examination cannot be done and the patient may need to come in for an appointment) / benefits (allows the patient to remain at home, decreasing exposure to coronavirus). I personally spent 10 minutes on medical discussion.

## 2021-01-11 DIAGNOSIS — R079 Chest pain, unspecified: Secondary | ICD-10-CM

## 2021-01-11 LAB — COMPREHENSIVE METABOLIC PANEL
ALT: 30 U/L (ref 0–44)
AST: 36 U/L (ref 15–41)
Albumin: 4.4 g/dL (ref 3.5–5.0)
Alkaline Phosphatase: 114 U/L (ref 38–126)
Anion gap: 10 (ref 5–15)
BUN: 6 mg/dL (ref 6–20)
CO2: 29 mmol/L (ref 22–32)
Calcium: 9.2 mg/dL (ref 8.9–10.3)
Chloride: 87 mmol/L — ABNORMAL LOW (ref 98–111)
Creatinine, Ser: 0.49 mg/dL — ABNORMAL LOW (ref 0.61–1.24)
GFR, Estimated: >60 mL/min (ref >60)
Glucose, Bld: 117 mg/dL — ABNORMAL HIGH (ref 70–99)
Potassium: 3.5 mmol/L (ref 3.5–5.1)
Sodium: 126 mmol/L — ABNORMAL LOW (ref 135–145)
Total Bilirubin: 1.9 mg/dL — ABNORMAL HIGH (ref 0.3–1.2)
Total Protein: 7.4 g/dL (ref 6.5–8.1)

## 2021-01-11 LAB — HEPATIC FUNCTION PANEL
ALT: 32 U/L (ref 0–44)
AST: 39 U/L (ref 15–41)
Albumin: 4.3 g/dL (ref 3.5–5.0)
Alkaline Phosphatase: 110 U/L (ref 38–126)
Bilirubin, Direct: 0.4 mg/dL — ABNORMAL HIGH (ref 0.0–0.2)
Indirect Bilirubin: 1.3 mg/dL — ABNORMAL HIGH (ref 0.3–0.9)
Total Bilirubin: 1.7 mg/dL — ABNORMAL HIGH (ref 0.3–1.2)
Total Protein: 7.3 g/dL (ref 6.5–8.1)

## 2021-01-11 LAB — LIPASE, BLOOD: Lipase: 29 U/L (ref 11–51)

## 2021-01-11 LAB — RESP PANEL BY RT-PCR (FLU A&B, COVID) ARPGX2
Influenza A by PCR: NEGATIVE
Influenza B by PCR: NEGATIVE
SARS Coronavirus 2 by RT PCR: NEGATIVE

## 2021-01-11 LAB — SODIUM: Sodium: 122 mmol/L — ABNORMAL LOW (ref 135–145)

## 2021-01-11 LAB — ETHANOL: Alcohol, Ethyl (B): <10 mg/dL (ref <10)

## 2021-01-11 LAB — RAPID URINE DRUG SCREEN, HOSP PERFORMED
Amphetamines: NOT DETECTED
Barbiturates: NOT DETECTED
Benzodiazepines: NOT DETECTED
Cocaine: NOT DETECTED
Opiates: NOT DETECTED
Tetrahydrocannabinol: NOT DETECTED

## 2021-01-11 MED ORDER — LORAZEPAM 2 MG/ML IJ SOLN
2.0000 mg | Freq: Once | INTRAMUSCULAR | Status: AC
  Administered 2021-01-11: 2 mg via INTRAVENOUS
  Filled 2021-01-11: qty 1

## 2021-01-11 MED ORDER — NICOTINE 21 MG/24HR TD PT24
21.0000 mg | MEDICATED_PATCH | Freq: Every day | TRANSDERMAL | Status: DC
  Administered 2021-01-11 – 2021-01-17 (×7): 21 mg via TRANSDERMAL
  Filled 2021-01-11 (×8): qty 1

## 2021-01-11 MED ORDER — METOPROLOL TARTRATE 25 MG PO TABS
25.0000 mg | ORAL_TABLET | Freq: Two times a day (BID) | ORAL | Status: DC
  Administered 2021-01-11 – 2021-01-18 (×15): 25 mg via ORAL
  Filled 2021-01-11 (×15): qty 1

## 2021-01-11 MED ORDER — POTASSIUM CHLORIDE CRYS ER 20 MEQ PO TBCR
40.0000 meq | EXTENDED_RELEASE_TABLET | Freq: Two times a day (BID) | ORAL | Status: DC
  Administered 2021-01-11 – 2021-01-18 (×15): 40 meq via ORAL
  Filled 2021-01-11 (×15): qty 2

## 2021-01-11 MED ORDER — BUPRENORPHINE HCL-NALOXONE HCL 8-2 MG SL SUBL
1.0000 | SUBLINGUAL_TABLET | Freq: Three times a day (TID) | SUBLINGUAL | Status: DC
  Administered 2021-01-11 – 2021-01-18 (×22): 1 via SUBLINGUAL
  Filled 2021-01-11 (×5): qty 1
  Filled 2021-01-11: qty 2
  Filled 2021-01-11 (×16): qty 1

## 2021-01-11 MED ORDER — ALBUTEROL SULFATE (2.5 MG/3ML) 0.083% IN NEBU
2.5000 mg | INHALATION_SOLUTION | Freq: Four times a day (QID) | RESPIRATORY_TRACT | Status: DC | PRN
  Administered 2021-01-11: 2.5 mg via RESPIRATORY_TRACT
  Filled 2021-01-11: qty 3

## 2021-01-11 MED ORDER — LORAZEPAM 2 MG/ML IJ SOLN: 1.0000 mg | Freq: Once | INTRAMUSCULAR | Status: DC

## 2021-01-11 MED ORDER — GABAPENTIN 100 MG PO CAPS
200.0000 mg | ORAL_CAPSULE | Freq: Two times a day (BID) | ORAL | Status: DC
  Administered 2021-01-11 – 2021-01-18 (×14): 200 mg via ORAL
  Filled 2021-01-11 (×14): qty 2

## 2021-01-11 NOTE — ED Notes (Signed)
Pt ambulatory to bathroom without assistance.

## 2021-01-11 NOTE — Progress Notes (Signed)
Internal Medicine Clinic Attending ? ?Case discussed with Dr. Braswell  At the time of the visit.  We reviewed the resident?s history and exam and pertinent patient test results.  I agree with the assessment, diagnosis, and plan of care documented in the resident?s note.  ?

## 2021-01-11 NOTE — Plan of Care (Signed)
Pt will remain free from falls and injury  throughout shift.

## 2021-01-11 NOTE — Assessment & Plan Note (Signed)
Patient presenting today to discuss OUD, suboxone refill. States he has not had any cravings or relapses. Most recent UTox in August appropriate. Per chart review, attending physician has already sent RX for suboxone at the beginning of the month.  - Suboxone 8-2mg  TID - Follow-up in four weeks

## 2021-01-11 NOTE — ED Notes (Signed)
Pt up to nurses station requesting something to eat and drink. Pt provided with 2 cans of cola as well as a sandwich. Pt also stated that a girl come and took his phone and charger to charge at the desk because there wasn't anywhere in the room to charge his phone. I questioned why it was not charged in his room because there there was plenty of plugs. Pt states he didn't see those plugs. Pt also stated that his charger wasn't returned so this nurse looked and let him know that it didn't make since the situation he was explaining. He then stated "well maybe I didn't have my charger and she was charging my phone with someone else's charger". Will continue to monitor pts behavior.

## 2021-01-11 NOTE — Progress Notes (Signed)
PT Cancellation Note  Patient Details Name: Mark [name] MRN: [id number] DOB: [birth year]   Cancelled Treatment:    Reason Eval/Treat Not Completed: PT screened, no needs identified, will sign off. Per RN, pt ambulating around room and around ED independently without AD throughout the day, no acute PT needs identified will sign off.   Talbot Grumbling PT, DPT 01/11/21, 3:50 PM

## 2021-01-11 NOTE — Assessment & Plan Note (Signed)
Mark [name] states that he believes he is drinking an excessive amount and he "knows he has a problem." Patient called our clinic two weeks ago asking about alcohol drug rehabilitation programs. Currently, patient states he is experiencing chest pain, dyspnea, and tremors. Notes that he feels as if he needs to go to the ED. Encouraged patient to go ahead to the Emergency Department for further treatment. Over the phone today, patient seemed very upset over his current situation. Currently denying suicidal or homicidal ideation.  If patient is admitted for alcohol withdrawal, will need to have follow-up appointment at [facility] to further discuss treatment options for him. Unfortunately, naltrexone will not be an option given his use of suboxone. Can consider use of acamprosate.  - Follow-up with Emergency Department for current symptoms - Can consider acamprosate for AUD during follow-up visit with [facility]

## 2021-01-11 NOTE — Progress Notes (Addendum)
PROGRESS NOTE    Mark [name]  YIF:[id number] DOB: 07/09/1965 DOA: 01/10/2021 PCP: [name], MD   Brief Narrative:  The patient is a 55 year old overweight Caucasian male with a past medical history significant for but not limited to polysubstance abuse including alcohol and cocaine abuse on Suboxone, hypertension, cardiomegaly, COPD, hyperlipidemia, GERD as well as anxiety and depression as well as other comorbidities who presents with a chief complaint of chest pain and wanting to detox from alcohol.  Patient is homeless and moved from place to place in yesterday's gathering his things when he noted focal nonradiating sharp pressure left-sided of his chest lasted for several minutes.  This resolved since being in the ED and he came in he states that he wants to detox from alcohol.  He states that he did drink 2 beers just to get him to the hospital and reports drinking about a case of beer daily.  Has attempted to detox in the past but reports that he has come to the ED for this previously denies any seizures or hallucinations but has been seeing floaters here and there and reports a possible seizure from alcohol withdrawal about 3 to 5 years ago.  States this is the worst he has felt trying to avoid alcohol.  He was admitted for his chest pain as well as alcohol abuse with concern for withdrawal.  Assessment & Plan:   Active Problems:   Essential hypertension   Hypokalemia   Polysubstance abuse ([name])   Hyponatremia   Alcohol withdrawal ([name])   Chest pain  Hyponatremia -Likely secondary to beer potomania and heavy alcohol abuse -Admitted with a sodium of 120 and this was normal about 6 months ago at 139; sodium trending upwards and went to 126 this morning -He received 1 L normal saline bolus and then was placed on continuous IV fluids at 100 mils per hour in the ED and reduce to 75 mL/hr -Continue to monitor and avoid overcorrection of greater than 8 mEq in 24 hours -Repeat CMP  in a.m.  Hypokalemia -Mild. K+ 3.3 -> 3.5 -Replete with po KCl 40 mEQ BID x2 -Continue to monitor and replete as necessary and magnesium level was not checked so we will check -Repeat CMP in a.m.  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 with his last echo showing EF of 50 to 55% -We will repeat echocardiogram and this is pending  Heavy alcohol use with concern for early withdrawals -Last beer was yesterday afternoon to get him to the hospital -Continue monitor for signs and symptoms of withdrawal; Was jittery this AM so gave a dose of IV Lorazepam 2 mg x1 -Placed on a CIWA protocol and started on folic acid, multivitamin with minerals and thiamine daily  Elevated anion gap -In the setting of heavy alcohol abuse -Alcohol level was less than 10 on arrival -Presented with a CO2 of 23, anion gap of 17, chloride level of 80 -Patient's CO2 is now 29, chloride level is now 87, and anion gap is 10 -Continue to monitor and trend and repeat CMP in the AM  Polysubstance Abuse -Counseling given and continue with Buprenoprhne-Naloxone 8-2 mg per SL tab 1 tab 3 times daily -UDS was negative  Hypertension -Uncontrolled; Restarted Metoprolol 25 mg po BID -Has not been compliant with his antihypertensives  in the past -Last blood pressure reading was 165/117 -Continue to monitor blood pressures per protocol -C/w Hydralazine 5 mg IV q6hprn HBP for SBP >180 or DBP >100  Hyperbilirubinemia -Patient's T bili went from 1.5 -> 1.7 (direct of 0.4 and indirect bilirubin of 1.3) -> 1.9 -Continue to monitor and trend and repeat CMP in a.m. if necessary will obtain a right upper quadrant ultrasound and acute hepatitis panel  Leukocytosis -WBC went from 8.5 -> 11.4 -Likely reactive -Continue to monitor and trend and repeat CBC  in a.m.  Tobacco Abuse -Smokes 2 PPD -Will provide Nicotine 21 mg TD Patch -Smoking cessation counseling given  Hyperglycemia -Likely reactive but will need to evaluate for diabetes mellitus -Blood sugar on admission was 153 and repeat this morning is 117 -Check hemoglobin A1c in the a.m. -Continue to monitor blood sugars carefully and if necessary will place on sensitive NovoLog sliding scale insulin AC  DVT prophylaxis: Enoxaparin 40 mg sq q24h  Code Status: FULL CODE  Family Communication: No family present at bedside  Disposition Plan:   Status is: Observation  The patient will require care spanning > 2 midnights and should be moved to inpatient because: Concern for Alcohol withdrawal   Consultants:  None  Procedures: None  Antimicrobials:  Anti-infectives (From admission, onward)    None       Subjective: Seen and examined at bedside was doing okay but was very jittery and anxious.  No chest pain or shortness of breath now.  Denies any lightheadedness or dizziness.  No other concerns or points this time but states that he has been drinking a case of beer almost daily and states that he drank 2 beers the day before to get him to the hospital.  Objective: Vitals:   01/11/21 0915 01/11/21 0930 01/11/21 1100 01/11/21 1110  BP:   (!) 165/117   Pulse: 93 99 (!) 106 82  Resp: 16 16 11 17   Temp:      TempSrc:      SpO2: 93% 95% 94% 94%  Weight:      Height:       No intake or output data in the 24 hours ending 01/11/21 1329 Filed Weights   01/10/21 1142 01/10/21 1924  Weight: 99.8 kg 99.8 kg   Examination: Physical Exam:  Constitutional: WN/WD overweight Caucasian male currently in NAD and appears calm and comfortable Eyes: Lids and conjunctivae normal, sclerae anicteric  ENMT: External Ears, Nose appear normal. Grossly normal hearing. Mucous membranes are moist Neck: Appears normal, supple, no cervical masses, normal ROM, no appreciable thyromegaly; no  appreciable JVD Respiratory: Diminished to auscultation bilaterally, no wheezing, rales, rhonchi or crackles. Normal respiratory effort and patient is not tachypenic. No accessory muscle use.  Unlabored bleeding Cardiovascular: RRR, no murmurs / rubs / gallops. S1 and S2 auscultated.  No appreciable extremity edema Abdomen: Soft, non-tender, distended secondary by habitus. No masses palpated. No appreciable hepatosplenomegaly. Bowel sounds positive.  GU: Deferred. Musculoskeletal: No clubbing / cyanosis of digits/nails. No joint deformity upper and lower extremities.  Skin: No rashes, lesions, ulcers on limited skin evaluation. No induration; Warm and dry.  Neurologic: CN 2-12 grossly intact with no focal deficits. Romberg sign and cerebellar reflexes not assessed.  Psychiatric: Normal judgment and insight. Alert and oriented x 3. Normal mood and appropriate affect.   Data Reviewed: I have personally reviewed following labs and imaging studies  CBC: Recent Labs  Lab 01/10/21 1746  WBC 11.4*  NEUTROABS 9.5*  HGB  16.3  HCT 44.7  MCV 81.3  PLT 580   Basic Metabolic Panel: Recent Labs  Lab 01/10/21 1746 01/10/21 2330 01/11/21 0430  NA 120* 122* 126*  K 3.3*  --  3.5  CL 80*  --  87*  CO2 23  --  29  GLUCOSE 153*  --  117*  BUN <5*  --  6  CREATININE 0.43*  --  0.49*  CALCIUM 9.5  --  9.2   GFR: Estimated Creatinine Clearance: 128.1 mL/min (A) (by C-G formula based on SCr of 0.49 mg/dL (L)). Liver Function Tests: Recent Labs  Lab 01/10/21 2330 01/11/21 0430  AST 39 36  ALT 32 30  ALKPHOS 110 114  BILITOT 1.7* 1.9*  PROT 7.3 7.4  ALBUMIN 4.3 4.4   Recent Labs  Lab 01/10/21 2330  LIPASE 29   No results for input(s): AMMONIA in the last 168 hours. Coagulation Profile: No results for input(s): INR, PROTIME in the last 168 hours. Cardiac Enzymes: No results for input(s): CKTOTAL, CKMB, CKMBINDEX, TROPONINI in the last 168 hours. BNP (last 3 results) No results for  input(s): PROBNP in the last 8760 hours. HbA1C: No results for input(s): HGBA1C in the last 72 hours. CBG: No results for input(s): GLUCAP in the last 168 hours. Lipid Profile: No results for input(s): CHOL, HDL, LDLCALC, TRIG, CHOLHDL, LDLDIRECT in the last 72 hours. Thyroid Function Tests: No results for input(s): TSH, T4TOTAL, FREET4, T3FREE, THYROIDAB in the last 72 hours. Anemia Panel: No results for input(s): VITAMINB12, FOLATE, FERRITIN, TIBC, IRON, RETICCTPCT in the last 72 hours. Sepsis Labs: No results for input(s): PROCALCITON, LATICACIDVEN in the last 168 hours.  Recent Results (from the past 240 hour(s))  Resp Panel by RT-PCR (Flu A&B, Covid) Nasopharyngeal Swab     Status: None   Collection Time: 01/10/21 11:30 PM   Specimen: Nasopharyngeal Swab; Nasopharyngeal(NP) swabs in vial transport medium  Result Value Ref Range Status   SARS Coronavirus 2 by RT PCR NEGATIVE NEGATIVE Final    Comment: (NOTE) SARS-CoV-2 target nucleic acids are NOT DETECTED.  The SARS-CoV-2 RNA is generally detectable in upper respiratory specimens during the acute phase of infection. The lowest concentration of SARS-CoV-2 viral copies this assay can detect is 138 copies/mL. A negative result does not preclude SARS-Cov-2 infection and should not be used as the sole basis for treatment or other patient management decisions. A negative result may occur with  improper specimen collection/handling, submission of specimen other than nasopharyngeal swab, presence of viral mutation(s) within the areas targeted by this assay, and inadequate number of viral copies(<138 copies/mL). A negative result must be combined with clinical observations, patient history, and epidemiological information. The expected result is Negative.  Fact Sheet for Patients:  EntrepreneurPulse.com.au  Fact Sheet for Healthcare Providers:  IncredibleEmployment.be  This test is no t yet  approved or cleared by the [place] FDA and  has been authorized for detection and/or diagnosis of SARS-CoV-2 by FDA under an Emergency Use Authorization (EUA). This EUA will remain  in effect (meaning this test can be used) for the duration of the COVID-19 declaration under Section 564(b)(1) of the Act, 21 U.S.C.section 360bbb-3(b)(1), unless the authorization is terminated  or revoked sooner.       Influenza A by PCR NEGATIVE NEGATIVE Final   Influenza B by PCR NEGATIVE NEGATIVE Final    Comment: (NOTE) The Xpert Xpress SARS-CoV-2/FLU/RSV plus assay is intended as an aid in the diagnosis of influenza from Nasopharyngeal swab specimens  and should not be used as a sole basis for treatment. Nasal washings and aspirates are unacceptable for Xpert Xpress SARS-CoV-2/FLU/RSV testing.  Fact Sheet for Patients: EntrepreneurPulse.com.au  Fact Sheet for Healthcare Providers: IncredibleEmployment.be  This test is not yet approved or cleared by the [place] FDA and has been authorized for detection and/or diagnosis of SARS-CoV-2 by FDA under an Emergency Use Authorization (EUA). This EUA will remain in effect (meaning this test can be used) for the duration of the COVID-19 declaration under Section 564(b)(1) of the Act, 21 U.S.C. section 360bbb-3(b)(1), unless the authorization is terminated or revoked.  Performed at [facility], [place] [address], [place], [place] [address]     RN Pressure Injury Documentation:     Estimated body mass index is 26.78 kg/m as calculated from the following:   Height as of this encounter: 6\' 4"  (1.93 m).   Weight as of this encounter: 99.8 kg.  Malnutrition Type:   Malnutrition Characteristics:   Nutrition Interventions:   Radiology Studies: DG Chest Port 1 View  Result Date: 01/10/2021 CLINICAL DATA:  Chest pain. EXAM: PORTABLE CHEST 1 VIEW COMPARISON:  PA and lateral chest  12/14/2020 FINDINGS: The heart size and mediastinal contours are within normal limits. Both lungs are clear. The visualized skeletal structures are unremarkable apart from old fusion plating in the lower cervical spine and thoracic spondylosis and degenerative disc disease. IMPRESSION: No evidence of acute chest disease or interval changes. Electronically Signed   By: Telford Nab M.D.   On: 01/10/2021 22:13    Scheduled Meds:  buprenorphine-naloxone  1 tablet Sublingual TID   enoxaparin (LOVENOX) injection  40 mg Subcutaneous Q24H   gabapentin  200 mg Oral BID   LORazepam  0-4 mg Intravenous Q6H   Or   LORazepam  0-4 mg Oral Q6H   metoprolol tartrate  25 mg Oral BID   potassium chloride  40 mEq Oral BID   thiamine  100 mg Oral Daily   Or   thiamine  100 mg Intravenous Daily   Continuous Infusions:  sodium chloride 100 mL/hr at 01/11/21 0907    LOS: 0 days   Kerney Elbe, DO [facility] PAGER is on AMION  If 7PM-7AM, please contact night-coverage [email or web address]

## 2021-01-12 ENCOUNTER — Inpatient Hospital Stay (HOSPITAL_COMMUNITY): Payer: Self-pay

## 2021-01-12 DIAGNOSIS — R079 Chest pain, unspecified: Secondary | ICD-10-CM

## 2021-01-12 DIAGNOSIS — Z720 Tobacco use: Secondary | ICD-10-CM

## 2021-01-12 LAB — CBC WITH DIFFERENTIAL/PLATELET
Abs Immature Granulocytes: 0.09 10*3/uL — ABNORMAL HIGH (ref 0.00–0.07)
Basophils Absolute: 0 10*3/uL (ref 0.0–0.1)
Basophils Relative: 0 %
Eosinophils Absolute: 0.1 10*3/uL (ref 0.0–0.5)
Eosinophils Relative: 2 %
HCT: 39.1 % (ref 39.0–52.0)
Hemoglobin: 13.8 g/dL (ref 13.0–17.0)
Immature Granulocytes: 1 %
Lymphocytes Relative: 22 %
Lymphs Abs: 1.5 10*3/uL (ref 0.7–4.0)
MCH: 29.8 pg (ref 26.0–34.0)
MCHC: 35.3 g/dL (ref 30.0–36.0)
MCV: 84.4 fL (ref 80.0–100.0)
Monocytes Absolute: 0.6 10*3/uL (ref 0.1–1.0)
Monocytes Relative: 8 %
Neutro Abs: 4.6 10*3/uL (ref 1.7–7.7)
Neutrophils Relative %: 67 %
Platelets: 202 10*3/uL (ref 150–400)
RBC: 4.63 MIL/uL (ref 4.22–5.81)
RDW: 13 % (ref 11.5–15.5)
WBC: 6.9 10*3/uL (ref 4.0–10.5)
nRBC: 0 % (ref 0.0–0.2)

## 2021-01-12 LAB — ECHOCARDIOGRAM COMPLETE
Height: 76 in
S' Lateral: 4 cm
Weight: 3521 oz

## 2021-01-12 LAB — PHOSPHORUS: Phosphorus: 2.5 mg/dL (ref 2.5–4.6)

## 2021-01-12 LAB — COMPREHENSIVE METABOLIC PANEL
ALT: 26 U/L (ref 0–44)
AST: 30 U/L (ref 15–41)
Albumin: 3.9 g/dL (ref 3.5–5.0)
Alkaline Phosphatase: 93 U/L (ref 38–126)
Anion gap: 7 (ref 5–15)
BUN: 8 mg/dL (ref 6–20)
CO2: 28 mmol/L (ref 22–32)
Calcium: 9.1 mg/dL (ref 8.9–10.3)
Chloride: 100 mmol/L (ref 98–111)
Creatinine, Ser: 0.47 mg/dL — ABNORMAL LOW (ref 0.61–1.24)
GFR, Estimated: >60 mL/min (ref >60)
Glucose, Bld: 115 mg/dL — ABNORMAL HIGH (ref 70–99)
Potassium: 4.3 mmol/L (ref 3.5–5.1)
Sodium: 135 mmol/L (ref 135–145)
Total Bilirubin: 0.6 mg/dL (ref 0.3–1.2)
Total Protein: 6.6 g/dL (ref 6.5–8.1)

## 2021-01-12 LAB — MAGNESIUM: Magnesium: 2.4 mg/dL (ref 1.7–2.4)

## 2021-01-12 MED ORDER — LORAZEPAM 1 MG PO TABS
1.0000 mg | ORAL_TABLET | ORAL | Status: AC | PRN
  Administered 2021-01-14: 4 mg via ORAL
  Filled 2021-01-12: qty 4
  Filled 2021-01-12: qty 2
  Filled 2021-01-12: qty 3

## 2021-01-12 MED ORDER — LORAZEPAM 2 MG/ML IJ SOLN
2.0000 mg | Freq: Once | INTRAMUSCULAR | Status: AC
  Administered 2021-01-12: 2 mg via INTRAVENOUS
  Filled 2021-01-12: qty 1

## 2021-01-12 MED ORDER — FOLIC ACID 1 MG PO TABS
1.0000 mg | ORAL_TABLET | Freq: Every day | ORAL | Status: DC
  Administered 2021-01-12 – 2021-01-18 (×7): 1 mg via ORAL
  Filled 2021-01-12 (×7): qty 1

## 2021-01-12 MED ORDER — LORAZEPAM 2 MG/ML IJ SOLN
0.0000 mg | Freq: Three times a day (TID) | INTRAMUSCULAR | Status: AC
  Administered 2021-01-14: 2 mg via INTRAVENOUS
  Administered 2021-01-14: 4 mg via INTRAVENOUS
  Administered 2021-01-15: 3 mg via INTRAVENOUS
  Administered 2021-01-15 (×2): 2 mg via INTRAVENOUS
  Filled 2021-01-12 (×2): qty 1
  Filled 2021-01-12: qty 2
  Filled 2021-01-12: qty 1
  Filled 2021-01-12: qty 2

## 2021-01-12 MED ORDER — THIAMINE HCL 100 MG/ML IJ SOLN: 100.0000 mg | Freq: Every day | INTRAMUSCULAR | Status: DC

## 2021-01-12 MED ORDER — SODIUM CHLORIDE 0.9 % IV SOLN: INTRAVENOUS | Status: AC

## 2021-01-12 MED ORDER — LORAZEPAM 2 MG/ML IJ SOLN
1.0000 mg | INTRAMUSCULAR | Status: AC | PRN
  Administered 2021-01-12: 1 mg via INTRAVENOUS
  Administered 2021-01-12: 2 mg via INTRAVENOUS
  Filled 2021-01-12 (×3): qty 1

## 2021-01-12 MED ORDER — THIAMINE HCL 100 MG PO TABS: 100.0000 mg | ORAL_TABLET | Freq: Every day | ORAL | Status: DC

## 2021-01-12 MED ORDER — LORAZEPAM 2 MG/ML IJ SOLN
0.0000 mg | INTRAMUSCULAR | Status: AC
Start: 2021-01-12 — End: 2021-01-14
  Administered 2021-01-12: 1 mg via INTRAVENOUS
  Administered 2021-01-12: 2 mg via INTRAVENOUS
  Administered 2021-01-13: 1 mg via INTRAVENOUS
  Administered 2021-01-13 (×3): 2 mg via INTRAVENOUS
  Administered 2021-01-13: 1 mg via INTRAVENOUS
  Administered 2021-01-13 – 2021-01-14 (×4): 2 mg via INTRAVENOUS
  Filled 2021-01-12 (×10): qty 1

## 2021-01-12 MED ORDER — ADULT MULTIVITAMIN W/MINERALS CH
1.0000 | ORAL_TABLET | Freq: Every day | ORAL | Status: DC
  Administered 2021-01-12 – 2021-01-18 (×7): 1 via ORAL
  Filled 2021-01-12 (×7): qty 1

## 2021-01-12 NOTE — Progress Notes (Addendum)
2000 Pt resting in bed with call bell in reach. Pt requested Ativan. Educated pt Ativan is part of his CIWA score and is not PRN and that he just received it at [place]. This score is every 6 hrs. Pt said "I'll not be able to sleep. I'm so nauseated."  2200 Pt requested ice cream and ginger ale. 2330 Pt in bed snoring with eyes closed and call bell in reach NADN 0000 Pt in bed snoring with eyes closed and call bell in reach. Agricultural consultant notified. Scored CIWA 0. 0045 Pt called out for "Ativan and a drink" Entered educated pt his CIWA was 0 since he was asleep. That we will do another CIWA at 0400. Pt began cursing me and aggressive and demanding to speak to supervisor. Charge RN aware. Pt  had ginger ale in room and given ginger ale. 0115 Pt took telemetry monitor off and unhooked himself from IV and putting street clothes on. Agricultural consultant in room and administering IV Ativan 2mg  and educated pt the next CIWA will be moved 6 hours from now.  0130 Pt back in hospital gown, IV attached and telemetry back on. Pt requested food and drink of charge RN. Pt received 2 cokes and a sandwich from [employer]

## 2021-01-12 NOTE — Progress Notes (Signed)
Echocardiogram 2D Echocardiogram has been performed.  Oneal Deputy Moise Friday RDCS 01/12/2021, 8:36 AM

## 2021-01-12 NOTE — Plan of Care (Signed)
  Problem: Education: Goal: Knowledge of General Education information will improve Description: Including pain rating scale, medication(s)/side effects and non-pharmacologic comfort measures Outcome: Progressing   Problem: Clinical Measurements: Goal: Diagnostic test results will improve Outcome: Progressing   Problem: Nutrition: Goal: Adequate nutrition will be maintained Outcome: Progressing   Problem: Coping: Goal: Level of anxiety will decrease Outcome: Progressing   Problem: Safety: Goal: Ability to remain free from injury will improve Outcome: Progressing

## 2021-01-12 NOTE — Progress Notes (Signed)
Called to room by RN regarding pt agitation and  inappropriate use of words regarding ativan administration. Pt noted in street clothes and was preparing to leave.  Was able to calm pt and explained ativan administration schedule. Ativan  administered at this time on a ciwa score of 11. Explained to pt when next schedule dose will be given. Verbalized understanding.

## 2021-01-12 NOTE — Progress Notes (Addendum)
21900 Pt sleeping in bed with eyes closed and snoring when entered room to put name on board at report. Call bell in reach NADN 1925 IV pump beeping. Pt awake and said, "It should be time for more medication right? I shaking". Educated pt the CIWA is every 4 hours. Will continue to monitor.  1930 Pt came in hall asking another nurse for medication. Re-educated pt it is not due yet. 1937 Pam Charge RN aware.     Revision History 1950 Pt called out asking "Where's my medication" Educated pt again that we are not sue for CIWA or Ativan until 2000. That it's every 4 hrs and that I cannot give it to him before that. Pt educated multiple times within 30 minutes 2007 CIWA score 7 per order Ativan 1 mg IV given. Pt asked "How much am I getting" Educated pt 1 mg Ativan per order protocol. Pt stated "So I'm not getting my 2 of Ativan?" Educated pt Per protocol it requires 1mg . "Pt stated "So it depends on who gives me the Ativan on how much I get?" Educated pt again "No it's per protocol meaning it is per MD orders that we all follow on how much you are to receive for the CIWA protocol." Pt stated "The doctor gave me 2 today" Educated pt that I am following Dr orders. Will continue to monitor 2015 Pt threatened to leave AMA Pt stated "I'm just going to leave if I can't get my full dose of Ativan". Charge RN aware. 2030 Pt pulled telemetry monitor off and charge RN in room. 2040 Pt leaving AMA. Md notified. 2045 Went in pt room to remove IV pt backed away and said "No I'm leaving it for now" Asked pt "Is he staying" Pt replied "For now. I want someone else taking care of me" Charge RN aware. MD notified

## 2021-01-12 NOTE — Progress Notes (Signed)
OT Screen Note  Patient Details Name: Sergey Ishler MRN: [id number] DOB: 17-Mar-1965   Cancelled Treatment:    Reason Eval/Treat Not Completed: OT screened, no needs identified, will sign off: Pt reports no needs, ambulating and performing all ADLs independently. Agrees for OT to sign off.   Julien Girt 01/12/2021, 12:16 PM

## 2021-01-12 NOTE — Progress Notes (Signed)
PROGRESS NOTE    Mark [name]  ZOX:[id number] DOB: October 10, 1965 DOA: 01/10/2021 PCP: [name], MD   Brief Narrative:  The patient is a 55 year old overweight Caucasian male with a past medical history significant for but not limited to polysubstance abuse including alcohol and cocaine abuse on Suboxone, hypertension, cardiomegaly, COPD, hyperlipidemia, GERD as well as anxiety and depression as well as other comorbidities who presents with a chief complaint of chest pain and wanting to detox from alcohol.  Patient is homeless and moved from place to place in yesterday's gathering his things when he noted focal nonradiating sharp pressure left-sided of his chest lasted for several minutes.  This resolved since being in the ED and he came in he states that he wants to detox from alcohol.  He states that he did drink 2 beers just to get him to the hospital and reports drinking about a case of beer daily.  Has attempted to detox in the past but reports that he has come to the ED for this previously denies any seizures or hallucinations but has been seeing floaters here and there and reports a possible seizure from alcohol withdrawal about 3 to 5 years ago.  States this is the worst he has felt trying to avoid alcohol.  He was admitted for his chest pain as well as alcohol abuse with concern for withdrawal. His Na+ has improved and resolved so will reduce IVF to NS at 50 mL/hr for 10 more hours   Patient is currently actively withdrawing and is very anxious and tremulous and has abdominal bloating now.   Assessment & Plan:   Active Problems:   Essential hypertension   Hypokalemia   Polysubstance abuse ([name])   Hyponatremia   Alcohol withdrawal ([name])   Chest pain  Hyponatremia, improved  -Likely secondary to beer potomania and heavy alcohol abuse -Admitted with a sodium of 120 and this was normal about 6 months ago at 139; sodium trending upwards and went to 126 yesterday morning and is now  normalized to 135 -He received 1 L normal saline bolus and then was placed on continuous IV fluids at 100 mils per hour in the ED and reduced to 75 mL/hr yesterday and will reduce to 50 mL/hr today for 10 more hours -Continue to monitor closely and avoid overcorrection  -Repeat CMP in a.m.  Hypokalemia -Mild. K+ 3.3 -> 3.5 -> 4.3 -Replete with po KCl 40 mEQ BID x2 yesterday  -Continue to monitor and replete as necessary and magnesium level was not checked so we will check -Repeat CMP in a.m.  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 with his last echo showing EF of 50 to 55% -We will repeat echocardiogram and this was done but pending read  Heavy alcohol use with Acute Alcohol withdrawl -Last beer was yesterday afternoon to get him to the hospital -Continue monitor for signs and symptoms of withdrawal; Was jittery this AM so gave a dose of IV Lorazepam 2 mg x1 -Placed on a CIWA protocol and started on folic acid, multivitamin with minerals and thiamine daily; Will escalate to SDU withdrawal protocol  -Was to add Librium but patient has an "adverse" reaction to Librium -CWA Score this AM was 6 and the one before that was 11; Ranging from 6-11 -He was actively tremulous  and anxious and had abdominal bloating this AM -Will need TOC for substance abuse and resources   Elevated anion gap, improved  -In the setting of heavy alcohol abuse -Alcohol level was less than 10 on arrival -Presented with a CO2 of 23, anion gap of 17, chloride level of 80 -Patient's CO2 is now 28, chloride level is now 100, and anion gap is 7 -Continue to monitor and trend and repeat CMP in the AM  Polysubstance Abuse -Counseling given and continue with Buprenoprhne-Naloxone 8-2 mg per SL tab 1 tab 3 times daily -UDS was  negative  Hypertension -Uncontrolled; Restarted Metoprolol 25 mg po BID -Has not been compliant with his antihypertensives in the past -Last blood pressure reading was 140/93 -Continue to monitor blood pressures per protocol -C/w Hydralazine 5 mg IV q6hprn HBP for SBP >180 or DBP >100  Hyperbilirubinemia -Patient's T bili went from 1.5 -> 1.7 (direct of 0.4 and indirect bilirubin of 1.3) -> 1.9 and is now normalized to 0.6 -Continue to monitor and trend and repeat CMP in a.m. if necessary will obtain a right upper quadrant ultrasound and acute hepatitis panel  Leukocytosis -WBC went from 8.5 -> 11.4 -> 6.9 -Likely reactive -Continue to monitor and trend and repeat CBC in a.m.  Tobacco Abuse -Smokes 2 PPD -Will provide Nicotine 21 mg TD Patch -Smoking cessation counseling given  Hyperglycemia -Likely reactive but will need to evaluate for diabetes mellitus -Blood sugar on admission was 153 and repeat this morning is 115 -Check hemoglobin A1c in the a.m. -Continue to monitor blood sugars carefully and if necessary will place on sensitive NovoLog sliding scale insulin AC  DVT prophylaxis: Enoxaparin 40 mg sq q24h  Code Status: FULL CODE  Family Communication: No family present at bedside  Disposition Plan:   Status is: Inpatient   The patient will require care spanning > 2 midnights and should be moved to inpatient because: Concern for Alcohol withdrawal   Consultants:  None  Procedures:  ECHOCARDIOGRAM  Antimicrobials:  Anti-infectives (From admission, onward)    None       Subjective: Seen and examined at bedside and he was very anxious and tremulous. He states he did not have a very good interaction with the night time nurse. No CP or SOB but abdomen feels bloated and a little tight. No other concerns or complaints at this time.   Objective: Vitals:   01/11/21 2039 01/12/21 0121 01/12/21 0540 01/12/21 0628  BP: (!) 148/99 (!) 179/103 (!) 131/51 (!) 140/93   Pulse: 96 98 93 (!) 102  Resp: 18 18 19 18   Temp: 98.2 F (36.8 C) 97.9 F (36.6 C) (!) 97.4 F (36.3 C) 97.9 F (36.6 C)  TempSrc: Oral Oral Oral Oral  SpO2: 98% 97% 98% 96%  Weight:      Height:        Intake/Output Summary (Last 24 hours) at 01/12/2021 0843 Last data filed at 01/12/2021 0300 Gross per 24 hour  Intake 1080 ml  Output --  Net 1080 ml   Filed Weights   01/10/21 1142 01/10/21 1924 01/11/21 1726  Weight: 99.8 kg 99.8 kg 99.8 kg   Examination: Physical Exam:  Constitutional: WN/WD overweight Caucasian male who is extremely anxious and tremulous and actively withdrawing  Eyes: Lids and conjunctivae normal, sclerae anicteric  ENMT: External Ears, Nose appear normal. Grossly normal hearing. Mucous membranes are moist.  Neck: Appears normal, supple, no cervical masses, normal ROM, no appreciable thyromegaly; no JVD Respiratory: Diminished to  auscultation bilaterally, no wheezing, rales, rhonchi or crackles. Normal respiratory effort and patient is not tachypenic. No accessory muscle use. Unlabored breathing  Cardiovascular: RRR, no murmurs / rubs / gallops. S1 and S2 auscultated. No extremity edema.  Abdomen: Soft, non-tender, Distended and slightly bloated. Bowel sounds positive.  GU: Deferred. Musculoskeletal: No clubbing / cyanosis of digits/nails. No joint deformity upper and lower extremities.  Skin: No rashes, lesions, ulcers on a limited skin evaluation. No induration; Warm and dry.  Neurologic: CN 2-12 grossly intact with no focal deficits. Patient is very tremulous and anxious. Romberg sign and cerebellar reflexes not assessed.  Psychiatric: Normal judgment and insight. Alert and oriented x 3. Extremely anxious and slightly agitated mood and appropriate affect.   Data Reviewed: I have personally reviewed following labs and imaging studies  CBC: Recent Labs  Lab 01/10/21 1746 01/12/21 0353  WBC 11.4* 6.9  NEUTROABS 9.5* 4.6  HGB 16.3 13.8  HCT 44.7  39.1  MCV 81.3 84.4  PLT 295 657    Basic Metabolic Panel: Recent Labs  Lab 01/10/21 1746 01/10/21 2330 01/11/21 0430 01/12/21 0353  NA 120* 122* 126* 135  K 3.3*  --  3.5 4.3  CL 80*  --  87* 100  CO2 23  --  29 28  GLUCOSE 153*  --  117* 115*  BUN <5*  --  6 8  CREATININE 0.43*  --  0.49* 0.47*  CALCIUM 9.5  --  9.2 9.1  MG  --   --   --  2.4  PHOS  --   --   --  2.5    GFR: Estimated Creatinine Clearance: 128.1 mL/min (A) (by C-G formula based on SCr of 0.47 mg/dL (L)). Liver Function Tests: Recent Labs  Lab 01/10/21 2330 01/11/21 0430 01/12/21 0353  AST 39 36 30  ALT 32 30 26  ALKPHOS 110 114 93  BILITOT 1.7* 1.9* 0.6  PROT 7.3 7.4 6.6  ALBUMIN 4.3 4.4 3.9    Recent Labs  Lab 01/10/21 2330  LIPASE 29    No results for input(s): AMMONIA in the last 168 hours. Coagulation Profile: No results for input(s): INR, PROTIME in the last 168 hours. Cardiac Enzymes: No results for input(s): CKTOTAL, CKMB, CKMBINDEX, TROPONINI in the last 168 hours. BNP (last 3 results) No results for input(s): PROBNP in the last 8760 hours. HbA1C: No results for input(s): HGBA1C in the last 72 hours. CBG: No results for input(s): GLUCAP in the last 168 hours. Lipid Profile: No results for input(s): CHOL, HDL, LDLCALC, TRIG, CHOLHDL, LDLDIRECT in the last 72 hours. Thyroid Function Tests: No results for input(s): TSH, T4TOTAL, FREET4, T3FREE, THYROIDAB in the last 72 hours. Anemia Panel: No results for input(s): VITAMINB12, FOLATE, FERRITIN, TIBC, IRON, RETICCTPCT in the last 72 hours. Sepsis Labs: No results for input(s): PROCALCITON, LATICACIDVEN in the last 168 hours.  Recent Results (from the past 240 hour(s))  Resp Panel by RT-PCR (Flu A&B, Covid) Nasopharyngeal Swab     Status: None   Collection Time: 01/10/21 11:30 PM   Specimen: Nasopharyngeal Swab; Nasopharyngeal(NP) swabs in vial transport medium  Result Value Ref Range Status   SARS Coronavirus 2 by RT PCR  NEGATIVE NEGATIVE Final    Comment: (NOTE) SARS-CoV-2 target nucleic acids are NOT DETECTED.  The SARS-CoV-2 RNA is generally detectable in upper respiratory specimens during the acute phase of infection. The lowest concentration of SARS-CoV-2 viral copies this assay can detect is 138 copies/mL. A negative result does  not preclude SARS-Cov-2 infection and should not be used as the sole basis for treatment or other patient management decisions. A negative result may occur with  improper specimen collection/handling, submission of specimen other than nasopharyngeal swab, presence of viral mutation(s) within the areas targeted by this assay, and inadequate number of viral copies(<138 copies/mL). A negative result must be combined with clinical observations, patient history, and epidemiological information. The expected result is Negative.  Fact Sheet for Patients:  EntrepreneurPulse.com.au  Fact Sheet for Healthcare Providers:  IncredibleEmployment.be  This test is no t yet approved or cleared by the [place] FDA and  has been authorized for detection and/or diagnosis of SARS-CoV-2 by FDA under an Emergency Use Authorization (EUA). This EUA will remain  in effect (meaning this test can be used) for the duration of the COVID-19 declaration under Section 564(b)(1) of the Act, 21 U.S.C.section 360bbb-3(b)(1), unless the authorization is terminated  or revoked sooner.       Influenza A by PCR NEGATIVE NEGATIVE Final   Influenza B by PCR NEGATIVE NEGATIVE Final    Comment: (NOTE) The Xpert Xpress SARS-CoV-2/FLU/RSV plus assay is intended as an aid in the diagnosis of influenza from Nasopharyngeal swab specimens and should not be used as a sole basis for treatment. Nasal washings and aspirates are unacceptable for Xpert Xpress SARS-CoV-2/FLU/RSV testing.  Fact Sheet for Patients: EntrepreneurPulse.com.au  Fact Sheet for  Healthcare Providers: IncredibleEmployment.be  This test is not yet approved or cleared by the [place] FDA and has been authorized for detection and/or diagnosis of SARS-CoV-2 by FDA under an Emergency Use Authorization (EUA). This EUA will remain in effect (meaning this test can be used) for the duration of the COVID-19 declaration under Section 564(b)(1) of the Act, 21 U.S.C. section 360bbb-3(b)(1), unless the authorization is terminated or revoked.  Performed at [facility], [place] [address] [place], [place] [address]      RN Pressure Injury Documentation:     Estimated body mass index is 26.79 kg/m as calculated from the following:   Height as of this encounter: 6\' 4"  (1.93 m).   Weight as of this encounter: 99.8 kg.  Malnutrition Type:   Malnutrition Characteristics:   Nutrition Interventions:   Radiology Studies: DG Chest Port 1 View  Result Date: 01/10/2021 CLINICAL DATA:  Chest pain. EXAM: PORTABLE CHEST 1 VIEW COMPARISON:  PA and lateral chest 12/14/2020 FINDINGS: The heart size and mediastinal contours are within normal limits. Both lungs are clear. The visualized skeletal structures are unremarkable apart from old fusion plating in the lower cervical spine and thoracic spondylosis and degenerative disc disease. IMPRESSION: No evidence of acute chest disease or interval changes. Electronically Signed   By: Telford Nab M.D.   On: 01/10/2021 22:13    Scheduled Meds:  buprenorphine-naloxone  1 tablet Sublingual TID   enoxaparin (LOVENOX) injection  40 mg Subcutaneous Q24H   gabapentin  200 mg Oral BID   LORazepam  0-4 mg Intravenous Q6H   Or   LORazepam  0-4 mg Oral Q6H   metoprolol tartrate  25 mg Oral BID   nicotine  21 mg Transdermal Daily   potassium chloride  40 mEq Oral BID   thiamine  100 mg Oral Daily   Or   thiamine  100 mg Intravenous Daily   Continuous Infusions:  sodium chloride      LOS: 1 day    Kerney Elbe, DO [facility] PAGER is on AMION  If 7PM-7AM, please  contact night-coverage [email or web address]

## 2021-01-13 LAB — COMPREHENSIVE METABOLIC PANEL
ALT: 28 U/L (ref 0–44)
AST: 26 U/L (ref 15–41)
Albumin: 4 g/dL (ref 3.5–5.0)
Alkaline Phosphatase: 89 U/L (ref 38–126)
Anion gap: 6 (ref 5–15)
BUN: 7 mg/dL (ref 6–20)
CO2: 27 mmol/L (ref 22–32)
Calcium: 8.6 mg/dL — ABNORMAL LOW (ref 8.9–10.3)
Chloride: 100 mmol/L (ref 98–111)
Creatinine, Ser: 0.63 mg/dL (ref 0.61–1.24)
GFR, Estimated: >60 mL/min (ref >60)
Glucose, Bld: 125 mg/dL — ABNORMAL HIGH (ref 70–99)
Potassium: 3.9 mmol/L (ref 3.5–5.1)
Sodium: 133 mmol/L — ABNORMAL LOW (ref 135–145)
Total Bilirubin: 0.8 mg/dL (ref 0.3–1.2)
Total Protein: 6.6 g/dL (ref 6.5–8.1)

## 2021-01-13 LAB — MAGNESIUM: Magnesium: 2.1 mg/dL (ref 1.7–2.4)

## 2021-01-13 LAB — CBC WITH DIFFERENTIAL/PLATELET
Abs Immature Granulocytes: 0.13 10*3/uL — ABNORMAL HIGH (ref 0.00–0.07)
Basophils Absolute: 0 10*3/uL (ref 0.0–0.1)
Basophils Relative: 0 %
Eosinophils Absolute: 0.3 10*3/uL (ref 0.0–0.5)
Eosinophils Relative: 4 %
HCT: 38.3 % — ABNORMAL LOW (ref 39.0–52.0)
Hemoglobin: 13.1 g/dL (ref 13.0–17.0)
Immature Granulocytes: 2 %
Lymphocytes Relative: 21 %
Lymphs Abs: 1.8 10*3/uL (ref 0.7–4.0)
MCH: 29.8 pg (ref 26.0–34.0)
MCHC: 34.2 g/dL (ref 30.0–36.0)
MCV: 87.2 fL (ref 80.0–100.0)
Monocytes Absolute: 0.6 10*3/uL (ref 0.1–1.0)
Monocytes Relative: 8 %
Neutro Abs: 5.5 10*3/uL (ref 1.7–7.7)
Neutrophils Relative %: 65 %
Platelets: 225 10*3/uL (ref 150–400)
RBC: 4.39 MIL/uL (ref 4.22–5.81)
RDW: 13 % (ref 11.5–15.5)
WBC: 8.4 10*3/uL (ref 4.0–10.5)
nRBC: 0 % (ref 0.0–0.2)

## 2021-01-13 LAB — PHOSPHORUS: Phosphorus: 3.9 mg/dL (ref 2.5–4.6)

## 2021-01-13 MED ORDER — HYDROXYZINE HCL 25 MG PO TABS
25.0000 mg | ORAL_TABLET | Freq: Three times a day (TID) | ORAL | Status: DC | PRN
  Administered 2021-01-13 – 2021-01-17 (×4): 25 mg via ORAL
  Filled 2021-01-13 (×7): qty 1

## 2021-01-13 MED ORDER — SODIUM CHLORIDE 0.9 % IV SOLN: INTRAVENOUS | Status: AC

## 2021-01-13 NOTE — Progress Notes (Signed)
PROGRESS NOTE    Mark [name]  YQM:[id number] DOB: May 13, 1965 DOA: 01/10/2021 PCP: [name], MD   Brief Narrative:  The patient is a 55 year old overweight Caucasian male with a past medical history significant for but not limited to polysubstance abuse including alcohol and cocaine abuse on Suboxone, hypertension, cardiomegaly, COPD, hyperlipidemia, GERD as well as anxiety and depression as well as other comorbidities who presents with a chief complaint of chest pain and wanting to detox from alcohol.  Patient is homeless and moved from place to place in yesterday's gathering his things when he noted focal nonradiating sharp pressure left-sided of his chest lasted for several minutes.  This resolved since being in the ED and he came in he states that he wants to detox from alcohol.  He states that he did drink 2 beers just to get him to the hospital and reports drinking about a case of beer daily.  Has attempted to detox in the past but reports that he has come to the ED for this previously denies any seizures or hallucinations but has been seeing floaters here and there and reports a possible seizure from alcohol withdrawal about 3 to 5 years ago.  States this is the worst he has felt trying to avoid alcohol.  He was admitted for his chest pain as well as alcohol abuse with concern for withdrawal. His Na+ has improved and resolved so will reduce IVF to NS at 50 mL/hr for 10 more hours yesterday but Na+ dropped again so will resume IVF with NS at 75 mL/hr.  Patient is currently actively withdrawing and is very anxious and tremulous and has abdominal bloating now.   Assessment & Plan:   Active Problems:   Essential hypertension   Hypokalemia   Polysubstance abuse ([name])   Hyponatremia   Alcohol withdrawal ([name])   Chest pain  Hyponatremia, improved -Likely secondary to beer potomania and heavy alcohol abuse -Admitted with a sodium of 120 and this was normal about 6 months ago at 139;  sodium trending upwards and went to 126 yesterday morning and is now normalized to 135 yesterday and is now 133 -He received 1 L normal saline bolus and then was placed on continuous IV fluids at 100 mils per hour in the ED and reduced to 75 mL/hr the day before yesterday and reduced to 50 mL/hr yesterday for 10 more hours; Will resume IVF with NS at 75 mL/hr x12 hours today -Has mild LE edema   -Continue to monitor closely and avoid overcorrection  -Repeat CMP in a.m.  Hypokalemia -Mild. K+ 3.3 -> 3.5 -> 4.3 -> 3.9 -Continue to monitor and replete as necessary and magnesium level was not checked so we will check -Repeat CMP in a.m.  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 with his last echo showing EF of 50 to 55% -Repeat ECHO as below and showed "Normal biventricular function without evidence of hemodynamically significant valvular heart disease."  Heavy alcohol use with Acute Alcohol withdrawl -Last beer was yesterday afternoon to get him to the hospital -Continue monitor for signs and symptoms of withdrawal; Was jittery this AM so gave a dose of IV Lorazepam 2 mg x1 -Placed on a CIWA protocol and started on folic acid, multivitamin with minerals and thiamine daily; Will escalate to SDU withdrawal  protocol  -Was to add Librium but patient has an "adverse" reaction to Librium -CWA Score this AM was 6 and the one before that was 11; Ranging from 5-15 -He was actively tremulous and anxious and had abdominal bloating this AM -Will add Hydroxyzine 25 mg po TIDprn  -Will need TOC for substance abuse and resources   Elevated Anion Gap, improved  -In the setting of heavy alcohol abuse -Alcohol level was less than 10 on arrival -Presented with a CO2 of 23, anion gap of 17, chloride level of 80 -Patient's CO2 is now  27, chloride level is now 100, and anion gap is6 -Continue to monitor and trend and repeat CMP in the AM  Polysubstance Abuse -Counseling given and continue with Buprenoprhne-Naloxone 8-2 mg per SL tab 1 tab 3 times daily -UDS was negative  Hypertension -Uncontrolled; Restarted Metoprolol 25 mg po BID -Has not been compliant with his antihypertensives in the past -Last blood pressure reading was 154/95 -Continue to monitor blood pressures per protocol -C/w Hydralazine 5 mg IV q6hprn HBP for SBP >180 or DBP >100  Hyperbilirubinemia -Patient's T bili went from 1.5 -> 1.7 (direct of 0.4 and indirect bilirubin of 1.3) -> 1.9 and is now normalized to 0.6 yesterday and today is 0.8 -Continue to monitor and trend and repeat CMP in a.m. if necessary will obtain a right upper quadrant ultrasound and acute hepatitis panel  Leukocytosis -WBC went from 8.5 -> 11.4 -> 6.9 -> 8.4 -Likely reactive -Continue to monitor and trend and repeat CBC in a.m.  Tobacco Abuse -Smokes 2 PPD -Will provide Nicotine 21 mg TD Patch -Smoking cessation counseling given  Hyperglycemia -Likely reactive but will need to evaluate for diabetes mellitus -Blood sugar on admission was 153 and repeat this morning is 115 -Check hemoglobin A1c in the a.m.; Last HbA1c was 5.5 -Continue to monitor blood sugars carefully and if necessary will place on sensitive NovoLog sliding scale insulin AC  DVT prophylaxis: Enoxaparin 40 mg sq q24h  Code Status: FULL CODE  Family Communication: No family present at bedside  Disposition Plan:   Status is: Inpatient   The patient will require care spanning > 2 midnights and should be moved to inpatient because: Concern for Alcohol withdrawal   Consultants:  None  Procedures:  ECHOCARDIOGRAM IMPRESSIONS     1. Left ventricular ejection fraction, by estimation, is 55 to 60%. The  left ventricle has normal function. The left ventricle has no regional  wall motion abnormalities.  Indeterminate diastolic filling due to E-A  fusion.   2. Right ventricular systolic function is normal. The right ventricular  size is normal. Tricuspid regurgitation signal is inadequate for assessing  PA pressure.   3. The mitral valve is grossly normal. No evidence of mitral valve  regurgitation. No evidence of mitral stenosis.   4. The aortic valve was not well visualized. Aortic valve regurgitation  is not visualized. No aortic stenosis is present.   5. The inferior vena cava is normal in size with greater than 50%  respiratory variability, suggesting right atrial pressure of 3 mmHg.   Comparison(s): No significant change from prior study.   Conclusion(s)/Recommendation(s): Normal biventricular function without  evidence of hemodynamically significant valvular heart disease.   FINDINGS   Left Ventricle: Left ventricular ejection fraction, by estimation, is 55  to 60%. The left ventricle has normal function. The left ventricle has no  regional wall motion abnormalities. The left ventricular internal cavity  size was normal in size. There  is   no left ventricular hypertrophy. Indeterminate diastolic filling due to  E-A fusion.   Right Ventricle: The right ventricular size is normal. No increase in  right ventricular wall thickness. Right ventricular systolic function is  normal. Tricuspid regurgitation signal is inadequate for assessing PA  pressure.   Left Atrium: Left atrial size was normal in size.   Right Atrium: Right atrial size was normal in size.   Pericardium: There is no evidence of pericardial effusion.   Mitral Valve: The mitral valve is grossly normal. No evidence of mitral  valve regurgitation. No evidence of mitral valve stenosis.   Tricuspid Valve: The tricuspid valve is grossly normal. Tricuspid valve  regurgitation is not demonstrated. No evidence of tricuspid stenosis.   Aortic Valve: The aortic valve was not well visualized. Aortic valve   regurgitation is not visualized. No aortic stenosis is present.   Pulmonic Valve: The pulmonic valve was not well visualized. Pulmonic valve  regurgitation is not visualized.   Aorta: The aortic root is normal in size and structure.   Venous: The inferior vena cava is normal in size with greater than 50%  respiratory variability, suggesting right atrial pressure of 3 mmHg.   IAS/Shunts: The atrial septum is grossly normal.      LEFT VENTRICLE  PLAX 2D  LVIDd:         5.20 cm  LVIDs:         4.00 cm  LV PW:         1.20 cm  LV IVS:        1.00 cm  LVOT diam:     2.10 cm  LV SV:         64  LV SV Index:   28  LVOT Area:     3.46 cm      RIGHT VENTRICLE  RV S prime:     10.80 cm/s  TAPSE (M-mode): 2.3 cm   LEFT ATRIUM             Index        RIGHT ATRIUM           Index  LA diam:        4.10 cm 1.78 cm/m   RA Area:     18.60 cm  LA Vol (A2C):   64.3 ml 27.87 ml/m  RA Volume:   49.20 ml  21.33 ml/m  LA Vol (A4C):   51.3 ml 22.24 ml/m  LA Biplane Vol: 59.2 ml 25.66 ml/m   AORTIC VALVE  LVOT Vmax:   96.00 cm/s  LVOT Vmean:  74.100 cm/s  LVOT VTI:    0.185 m     AORTA  Ao Root diam: 3.30 cm      SHUNTS  Systemic VTI:  0.18 m  Systemic Diam: 2.10 cm   Antimicrobials:  Anti-infectives (From admission, onward)    None       Subjective: Seen and examined at bedside and still very anxious and jittery. Still actively withdrawing. Had a small bowel movement. No CP or SOB. Still has significant tremors and feels a little worse today. No other concerns or complaints at this time.   Objective: Vitals:   01/12/21 2059 01/12/21 2205 01/13/21 0000 01/13/21 0452  BP:  (!) 143/81 (!) 152/72 (!) 154/95  Pulse: 84 82 83 81  Resp:      Temp:    97.7 F (36.5 C)  TempSrc:    Oral  SpO2:  99%  Weight:      Height:        Intake/Output Summary (Last 24 hours) at 01/13/2021 0856 Last data filed at 01/12/2021 1700 Gross per 24 hour  Intake 1438.33 ml  Output --   Net 1438.33 ml    Filed Weights   01/10/21 1142 01/10/21 1924 01/11/21 1726  Weight: 99.8 kg 99.8 kg 99.8 kg   Examination: Physical Exam:  Constitutional: WN/WD overweight Caucasian male in NAD and appears extremely anxious and tremulous  Eyes: Lids and conjunctivae normal, sclerae anicteric  ENMT: External Ears, Nose appear normal. Grossly normal hearing. Mucous membranes are moist. Neck: Appears normal, supple, no cervical masses, normal ROM, no appreciable thyromegaly; no JVD Respiratory: Diminished to auscultation bilaterally, no wheezing, rales, rhonchi or crackles. Normal respiratory effort and patient is not tachypenic. No accessory muscle use. Unlabored breathing  Cardiovascular: RRR, no murmurs / rubs / gallops. S1 and S2 auscultated. 1+ LE bilateral edema  Abdomen: Soft, non-tender, Distended 2/2 body habitus. Bowel sounds positive.  GU: Deferred. Musculoskeletal: No clubbing / cyanosis of digits/nails. No joint deformity upper and lower extremities.  Skin: No rashes, lesions, ulcers on a limited skin evaluation. No induration; Warm and dry.  Neurologic: CN 2-12 grossly intact with no focal deficits. Romberg  and sign cerebellar reflexes not assessed.  Psychiatric: Normal judgment and insight. Alert and oriented x 3. Normal mood and appropriate affect.   Data Reviewed: I have personally reviewed following labs and imaging studies  CBC: Recent Labs  Lab 01/10/21 1746 01/12/21 0353 01/13/21 0344  WBC 11.4* 6.9 8.4  NEUTROABS 9.5* 4.6 5.5  HGB 16.3 13.8 13.1  HCT 44.7 39.1 38.3*  MCV 81.3 84.4 87.2  PLT 295 202 672   Basic Metabolic Panel: Recent Labs  Lab 01/10/21 1746 01/10/21 2330 01/11/21 0430 01/12/21 0353 01/13/21 0344  NA 120* 122* 126* 135 133*  K 3.3*  --  3.5 4.3 3.9  CL 80*  --  87* 100 100  CO2 23  --  29 28 27   GLUCOSE 153*  --  117* 115* 125*  BUN <5*  --  6 8 7   CREATININE 0.43*  --  0.49* 0.47* 0.63  CALCIUM 9.5  --  9.2 9.1 8.6*  MG  --    --   --  2.4 2.1  PHOS  --   --   --  2.5 3.9    GFR: Estimated Creatinine Clearance: 128.1 mL/min (by C-G formula based on SCr of 0.63 mg/dL). Liver Function Tests: Recent Labs  Lab 01/10/21 2330 01/11/21 0430 01/12/21 0353 01/13/21 0344  AST 39 36 30 26  ALT 32 30 26 28   ALKPHOS 110 114 93 89  BILITOT 1.7* 1.9* 0.6 0.8  PROT 7.3 7.4 6.6 6.6  ALBUMIN 4.3 4.4 3.9 4.0    Recent Labs  Lab 01/10/21 2330  LIPASE 29    No results for input(s): AMMONIA in the last 168 hours. Coagulation Profile: No results for input(s): INR, PROTIME in the last 168 hours. Cardiac Enzymes: No results for input(s): CKTOTAL, CKMB, CKMBINDEX, TROPONINI in the last 168 hours. BNP (last 3 results) No results for input(s): PROBNP in the last 8760 hours. HbA1C: No results for input(s): HGBA1C in the last 72 hours. CBG: No results for input(s): GLUCAP in the last 168 hours. Lipid Profile: No results for input(s): CHOL, HDL, LDLCALC, TRIG, CHOLHDL, LDLDIRECT in the last 72 hours. Thyroid Function Tests: No results for input(s): TSH, T4TOTAL, FREET4, T3FREE, THYROIDAB  in the last 72 hours. Anemia Panel: No results for input(s): VITAMINB12, FOLATE, FERRITIN, TIBC, IRON, RETICCTPCT in the last 72 hours. Sepsis Labs: No results for input(s): PROCALCITON, LATICACIDVEN in the last 168 hours.  Recent Results (from the past 240 hour(s))  Resp Panel by RT-PCR (Flu A&B, Covid) Nasopharyngeal Swab     Status: None   Collection Time: 01/10/21 11:30 PM   Specimen: Nasopharyngeal Swab; Nasopharyngeal(NP) swabs in vial transport medium  Result Value Ref Range Status   SARS Coronavirus 2 by RT PCR NEGATIVE NEGATIVE Final    Comment: (NOTE) SARS-CoV-2 target nucleic acids are NOT DETECTED.  The SARS-CoV-2 RNA is generally detectable in upper respiratory specimens during the acute phase of infection. The lowest concentration of SARS-CoV-2 viral copies this assay can detect is 138 copies/mL. A negative  result does not preclude SARS-Cov-2 infection and should not be used as the sole basis for treatment or other patient management decisions. A negative result may occur with  improper specimen collection/handling, submission of specimen other than nasopharyngeal swab, presence of viral mutation(s) within the areas targeted by this assay, and inadequate number of viral copies(<138 copies/mL). A negative result must be combined with clinical observations, patient history, and epidemiological information. The expected result is Negative.  Fact Sheet for Patients:  EntrepreneurPulse.com.au  Fact Sheet for Healthcare Providers:  IncredibleEmployment.be  This test is no t yet approved or cleared by the [place] FDA and  has been authorized for detection and/or diagnosis of SARS-CoV-2 by FDA under an Emergency Use Authorization (EUA). This EUA will remain  in effect (meaning this test can be used) for the duration of the COVID-19 declaration under Section 564(b)(1) of the Act, 21 U.S.C.section 360bbb-3(b)(1), unless the authorization is terminated  or revoked sooner.       Influenza A by PCR NEGATIVE NEGATIVE Final   Influenza B by PCR NEGATIVE NEGATIVE Final    Comment: (NOTE) The Xpert Xpress SARS-CoV-2/FLU/RSV plus assay is intended as an aid in the diagnosis of influenza from Nasopharyngeal swab specimens and should not be used as a sole basis for treatment. Nasal washings and aspirates are unacceptable for Xpert Xpress SARS-CoV-2/FLU/RSV testing.  Fact Sheet for Patients: EntrepreneurPulse.com.au  Fact Sheet for Healthcare Providers: IncredibleEmployment.be  This test is not yet approved or cleared by the [place] FDA and has been authorized for detection and/or diagnosis of SARS-CoV-2 by FDA under an Emergency Use Authorization (EUA). This EUA will remain in effect (meaning this test can be used)  for the duration of the COVID-19 declaration under Section 564(b)(1) of the Act, 21 U.S.C. section 360bbb-3(b)(1), unless the authorization is terminated or revoked.  Performed at [facility], [address], [place], [place] [address]      RN Pressure Injury Documentation:     Estimated body mass index is 26.79 kg/m as calculated from the following:   Height as of this encounter: 6\' 4"  (1.93 m).   Weight as of this encounter: 99.8 kg.  Malnutrition Type:   Malnutrition Characteristics:   Nutrition Interventions:   Radiology Studies: ECHOCARDIOGRAM COMPLETE  Result Date: 01/12/2021    ECHOCARDIOGRAM REPORT   Patient Name:   Mark [name] Date of Exam: 01/12/2021 Medical Rec #:  [id number]           Height:       76.0 in Accession #:    [id number]          Weight:       220.1 lb  Date of Birth:  1965-09-13           BSA:          2.307 m Patient Age:    59 years            BP:           140/93 mmHg Patient Gender: M                   HR:           94 bpm. Exam Location:  Inpatient Procedure: 2D Echo, Color Doppler and Cardiac Doppler Indications:    R07.9* Chest pain, unspecified  History:        Patient has prior history of Echocardiogram examinations, most                 recent 02/12/2019. COPD; Risk Factors:Hypertension, Dyslipidemia                 and PSA.  Sonographer:    Raquel Sarna Senior RDCS Referring Phys: [id number] [place] T TU  Sonographer Comments: Poor parasternal window due to COPD IMPRESSIONS  1. Left ventricular ejection fraction, by estimation, is 55 to 60%. The left ventricle has normal function. The left ventricle has no regional wall motion abnormalities. Indeterminate diastolic filling due to E-A fusion.  2. Right ventricular systolic function is normal. The right ventricular size is normal. Tricuspid regurgitation signal is inadequate for assessing PA pressure.  3. The mitral valve is grossly normal. No evidence of mitral valve regurgitation. No evidence  of mitral stenosis.  4. The aortic valve was not well visualized. Aortic valve regurgitation is not visualized. No aortic stenosis is present.  5. The inferior vena cava is normal in size with greater than 50% respiratory variability, suggesting right atrial pressure of 3 mmHg. Comparison(s): No significant change from prior study. Conclusion(s)/Recommendation(s): Normal biventricular function without evidence of hemodynamically significant valvular heart disease. FINDINGS  Left Ventricle: Left ventricular ejection fraction, by estimation, is 55 to 60%. The left ventricle has normal function. The left ventricle has no regional wall motion abnormalities. The left ventricular internal cavity size was normal in size. There is  no left ventricular hypertrophy. Indeterminate diastolic filling due to E-A fusion. Right Ventricle: The right ventricular size is normal. No increase in right ventricular wall thickness. Right ventricular systolic function is normal. Tricuspid regurgitation signal is inadequate for assessing PA pressure. Left Atrium: Left atrial size was normal in size. Right Atrium: Right atrial size was normal in size. Pericardium: There is no evidence of pericardial effusion. Mitral Valve: The mitral valve is grossly normal. No evidence of mitral valve regurgitation. No evidence of mitral valve stenosis. Tricuspid Valve: The tricuspid valve is grossly normal. Tricuspid valve regurgitation is not demonstrated. No evidence of tricuspid stenosis. Aortic Valve: The aortic valve was not well visualized. Aortic valve regurgitation is not visualized. No aortic stenosis is present. Pulmonic Valve: The pulmonic valve was not well visualized. Pulmonic valve regurgitation is not visualized. Aorta: The aortic root is normal in size and structure. Venous: The inferior vena cava is normal in size with greater than 50% respiratory variability, suggesting right atrial pressure of 3 mmHg. IAS/Shunts: The atrial septum is  grossly normal.  LEFT VENTRICLE PLAX 2D LVIDd:         5.20 cm LVIDs:         4.00 cm LV PW:         1.20 cm LV IVS:  1.00 cm LVOT diam:     2.10 cm LV SV:         64 LV SV Index:   28 LVOT Area:     3.46 cm  RIGHT VENTRICLE RV S prime:     10.80 cm/s TAPSE (M-mode): 2.3 cm LEFT ATRIUM             Index        RIGHT ATRIUM           Index LA diam:        4.10 cm 1.78 cm/m   RA Area:     18.60 cm LA Vol (A2C):   64.3 ml 27.87 ml/m  RA Volume:   49.20 ml  21.33 ml/m LA Vol (A4C):   51.3 ml 22.24 ml/m LA Biplane Vol: 59.2 ml 25.66 ml/m  AORTIC VALVE LVOT Vmax:   96.00 cm/s LVOT Vmean:  74.100 cm/s LVOT VTI:    0.185 m  AORTA Ao Root diam: 3.30 cm  SHUNTS Systemic VTI:  0.18 m Systemic Diam: 2.10 cm Eleonore Chiquito MD Electronically signed by Eleonore Chiquito MD Signature Date/Time: 01/12/2021/10:12:19 AM    Final     Scheduled Meds:  buprenorphine-naloxone  1 tablet Sublingual TID   enoxaparin (LOVENOX) injection  40 mg Subcutaneous X09M   folic acid  1 mg Oral Daily   gabapentin  200 mg Oral BID   LORazepam  0-4 mg Intravenous Q4H   Followed by   Derrill Memo ON 01/14/2021] LORazepam  0-4 mg Intravenous Q8H   metoprolol tartrate  25 mg Oral BID   multivitamin with minerals  1 tablet Oral Daily   nicotine  21 mg Transdermal Daily   potassium chloride  40 mEq Oral BID   thiamine  100 mg Oral Daily   Or   thiamine  100 mg Intravenous Daily   Continuous Infusions:   LOS: 2 days   Kerney Elbe, DO [facility] PAGER is on AMION  If 7PM-7AM, please contact night-coverage [email or web address]

## 2021-01-13 NOTE — Plan of Care (Signed)

## 2021-01-14 LAB — CBC WITH DIFFERENTIAL/PLATELET
Abs Immature Granulocytes: 0.15 10*3/uL — ABNORMAL HIGH (ref 0.00–0.07)
Basophils Absolute: 0 10*3/uL (ref 0.0–0.1)
Basophils Relative: 0 %
Eosinophils Absolute: 0.5 10*3/uL (ref 0.0–0.5)
Eosinophils Relative: 5 %
HCT: 42.9 % (ref 39.0–52.0)
Hemoglobin: 14.7 g/dL (ref 13.0–17.0)
Immature Granulocytes: 2 %
Lymphocytes Relative: 22 %
Lymphs Abs: 2.1 10*3/uL (ref 0.7–4.0)
MCH: 30.4 pg (ref 26.0–34.0)
MCHC: 34.3 g/dL (ref 30.0–36.0)
MCV: 88.8 fL (ref 80.0–100.0)
Monocytes Absolute: 0.7 10*3/uL (ref 0.1–1.0)
Monocytes Relative: 7 %
Neutro Abs: 6.3 10*3/uL (ref 1.7–7.7)
Neutrophils Relative %: 64 %
Platelets: 250 10*3/uL (ref 150–400)
RBC: 4.83 MIL/uL (ref 4.22–5.81)
RDW: 13.1 % (ref 11.5–15.5)
WBC: 9.7 10*3/uL (ref 4.0–10.5)
nRBC: 0 % (ref 0.0–0.2)

## 2021-01-14 LAB — HEMOGLOBIN A1C
Hgb A1c MFr Bld: 6 % — ABNORMAL HIGH (ref 4.8–5.6)
Mean Plasma Glucose: 125.5 mg/dL

## 2021-01-14 LAB — COMPREHENSIVE METABOLIC PANEL
ALT: 32 U/L (ref 0–44)
AST: 24 U/L (ref 15–41)
Albumin: 4.3 g/dL (ref 3.5–5.0)
Alkaline Phosphatase: 109 U/L (ref 38–126)
Anion gap: 6 (ref 5–15)
BUN: 10 mg/dL (ref 6–20)
CO2: 30 mmol/L (ref 22–32)
Calcium: 9.5 mg/dL (ref 8.9–10.3)
Chloride: 103 mmol/L (ref 98–111)
Creatinine, Ser: 0.78 mg/dL (ref 0.61–1.24)
GFR, Estimated: >60 mL/min (ref >60)
Glucose, Bld: 104 mg/dL — ABNORMAL HIGH (ref 70–99)
Potassium: 4.5 mmol/L (ref 3.5–5.1)
Sodium: 139 mmol/L (ref 135–145)
Total Bilirubin: 0.9 mg/dL (ref 0.3–1.2)
Total Protein: 7.2 g/dL (ref 6.5–8.1)

## 2021-01-14 LAB — PHOSPHORUS: Phosphorus: 4.2 mg/dL (ref 2.5–4.6)

## 2021-01-14 LAB — MAGNESIUM: Magnesium: 2.4 mg/dL (ref 1.7–2.4)

## 2021-01-14 MED ORDER — TAMSULOSIN HCL 0.4 MG PO CAPS
0.4000 mg | ORAL_CAPSULE | Freq: Every day | ORAL | Status: DC
  Administered 2021-01-14 – 2021-01-17 (×4): 0.4 mg via ORAL
  Filled 2021-01-14 (×4): qty 1

## 2021-01-14 NOTE — Progress Notes (Signed)
PROGRESS NOTE    Mark [name]  VPX:[id number] DOB: January 19, 1966 DOA: 01/10/2021 PCP: [name], MD   Brief Narrative:  The patient is a 55 year old overweight Caucasian male with a past medical history significant for but not limited to polysubstance abuse including alcohol and cocaine abuse on Suboxone, hypertension, cardiomegaly, COPD, hyperlipidemia, GERD as well as anxiety and depression as well as other comorbidities who presents with a chief complaint of chest pain and wanting to detox from alcohol.  Patient is homeless and moved from place to place in yesterday's gathering his things when he noted focal nonradiating sharp pressure left-sided of his chest lasted for several minutes.  This resolved since being in the ED and he came in he states that he wants to detox from alcohol.  He states that he did drink 2 beers just to get him to the hospital and reports drinking about a case of beer daily.  Has attempted to detox in the past but reports that he has come to the ED for this previously denies any seizures or hallucinations but has been seeing floaters here and there and reports a possible seizure from alcohol withdrawal about 3 to 5 years ago.  States this is the worst he has felt trying to avoid alcohol.  He was admitted for his chest pain as well as alcohol abuse with concern for withdrawal. His Na+ has improved and resolved so will reduce IVF to NS at 50 mL/hr for 10 more hours yesterday but Na+ dropped again so will resume IVF with NS at 75 mL/hr.  Patient is currently actively withdrawing and is very anxious and tremulous and has abdominal bloating now.  Per nursing his tremors are getting a little bit better but he still actively withdrawing today.  Wanting to go outside.  His laboratory work is all stable but CIWA score still elevated and was 13 this AM.   Assessment & Plan:   Active Problems:   Essential hypertension   Hypokalemia   Polysubstance abuse ([name])    Hyponatremia   Alcohol withdrawal ([name])   Chest pain  Hyponatremia, improved -Likely secondary to beer potomania and heavy alcohol abuse -Admitted with a sodium of 120 and this was normal about 6 months ago at 139; sodium trended back up to 139 -IVF now stopped  -Has mild LE edema   -Continue to monitor closely and avoid overcorrection  -Repeat CMP in a.m.  Hypokalemia -Mild. K+ 3.3 -> 3.5 -> 4.3 -> 3.9 -> 4.5 -Continue to monitor and replete as necessary and magnesium level was not checked so we will check -Repeat CMP in a.m.  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 with his last echo showing EF of 50 to 55% -Repeat ECHO as below and showed "Normal biventricular function without evidence of hemodynamically significant valvular heart disease."  Heavy alcohol use with Acute Alcohol withdrawl -Last beer was yesterday afternoon to get him to the hospital -Continue monitor for signs and symptoms of withdrawal; Was jittery this AM so gave a dose of IV Lorazepam 2 mg x1 -Placed on a CIWA protocol and started on folic acid, multivitamin with minerals and thiamine daily; Will escalate to SDU withdrawal protocol  -Was to add Librium but patient has an "adverse" reaction to Librium -CWA Score this AM was 6 and the  one before that was 11; Ranging from 5-15; CIWA was 13 this AM  -He was actively tremulous and anxious and had abdominal bloating this AM -Will add Hydroxyzine 25 mg po TIDprn  -Will need TOC for substance abuse and resources   Elevated Anion Gap, improved  -In the setting of heavy alcohol abuse -Alcohol level was less than 10 on arrival -Presented with a CO2 of 23, anion gap of 17, chloride level of 80 -Patient's CO2 is now 30, chloride level is now 103, and anion gap is 6 -Continue to monitor and trend  and repeat CMP in the AM  Polysubstance Abuse -Counseling given and continue with Buprenoprhne-Naloxone 8-2 mg per SL tab 1 tab 3 times daily -UDS was negative  Hypertension -Uncontrolled; Restarted Metoprolol 25 mg po BID -Has not been compliant with his antihypertensives in the past -Last blood pressure reading was 150/86 -Continue to monitor blood pressures per protocol -C/w Hydralazine 5 mg IV q6hprn HBP for SBP >180 or DBP >100  Hyperbilirubinemia -Patient's T bili trended up to 1.9 and is now 0.9 -Continue to monitor and trend and repeat CMP in a.m. if necessary will obtain a right upper quadrant ultrasound and acute hepatitis panel  Leukocytosis -WBC went from 8.5 -> 11.4 -> 6.9 -> 8.4 -> 9.7 -Likely reactive -Continue to monitor and trend and repeat CBC in a.m.  Tobacco Abuse -Smokes 2 PPD -Will provide Nicotine 21 mg TD Patch -Smoking cessation counseling given  Hyperglycemia in the setting of Pre-Diabetes -Likely reactive but will need to evaluate for diabetes mellitus -Blood sugar on admission was 153 and repeat this morning is 115 -Checked HbA1c and was 6.0.; Last HbA1c was 5.5 -Continue to monitor blood sugars carefully and if necessary will place on sensitive NovoLog sliding scale insulin AC  DVT prophylaxis: Enoxaparin 40 mg sq q24h  Code Status: FULL CODE  Family Communication: No family present at bedside  Disposition Plan: Pending further clinical improvement and improvement in his Withdrawals  Status is: Inpatient   The patient will require care spanning > 2 midnights and should be moved to inpatient because: Concern for Alcohol withdrawal   Consultants:  None  Procedures:  ECHOCARDIOGRAM IMPRESSIONS     1. Left ventricular ejection fraction, by estimation, is 55 to 60%. The  left ventricle has normal function. The left ventricle has no regional  wall motion abnormalities. Indeterminate diastolic filling due to E-A  fusion.   2. Right  ventricular systolic function is normal. The right ventricular  size is normal. Tricuspid regurgitation signal is inadequate for assessing  PA pressure.   3. The mitral valve is grossly normal. No evidence of mitral valve  regurgitation. No evidence of mitral stenosis.   4. The aortic valve was not well visualized. Aortic valve regurgitation  is not visualized. No aortic stenosis is present.   5. The inferior vena cava is normal in size with greater than 50%  respiratory variability, suggesting right atrial pressure of 3 mmHg.   Comparison(s): No significant change from prior study.   Conclusion(s)/Recommendation(s): Normal biventricular function without  evidence of hemodynamically significant valvular heart disease.   FINDINGS   Left Ventricle: Left ventricular ejection fraction, by estimation, is 55  to 60%. The left ventricle has normal function. The left ventricle has no  regional wall motion abnormalities. The left ventricular internal cavity  size was normal in size. There is   no left ventricular hypertrophy. Indeterminate diastolic filling due to  E-A fusion.   Right Ventricle:  The right ventricular size is normal. No increase in  right ventricular wall thickness. Right ventricular systolic function is  normal. Tricuspid regurgitation signal is inadequate for assessing PA  pressure.   Left Atrium: Left atrial size was normal in size.   Right Atrium: Right atrial size was normal in size.   Pericardium: There is no evidence of pericardial effusion.   Mitral Valve: The mitral valve is grossly normal. No evidence of mitral  valve regurgitation. No evidence of mitral valve stenosis.   Tricuspid Valve: The tricuspid valve is grossly normal. Tricuspid valve  regurgitation is not demonstrated. No evidence of tricuspid stenosis.   Aortic Valve: The aortic valve was not well visualized. Aortic valve  regurgitation is not visualized. No aortic stenosis is present.   Pulmonic  Valve: The pulmonic valve was not well visualized. Pulmonic valve  regurgitation is not visualized.   Aorta: The aortic root is normal in size and structure.   Venous: The inferior vena cava is normal in size with greater than 50%  respiratory variability, suggesting right atrial pressure of 3 mmHg.   IAS/Shunts: The atrial septum is grossly normal.      LEFT VENTRICLE  PLAX 2D  LVIDd:         5.20 cm  LVIDs:         4.00 cm  LV PW:         1.20 cm  LV IVS:        1.00 cm  LVOT diam:     2.10 cm  LV SV:         64  LV SV Index:   28  LVOT Area:     3.46 cm      RIGHT VENTRICLE  RV S prime:     10.80 cm/s  TAPSE (M-mode): 2.3 cm   LEFT ATRIUM             Index        RIGHT ATRIUM           Index  LA diam:        4.10 cm 1.78 cm/m   RA Area:     18.60 cm  LA Vol (A2C):   64.3 ml 27.87 ml/m  RA Volume:   49.20 ml  21.33 ml/m  LA Vol (A4C):   51.3 ml 22.24 ml/m  LA Biplane Vol: 59.2 ml 25.66 ml/m   AORTIC VALVE  LVOT Vmax:   96.00 cm/s  LVOT Vmean:  74.100 cm/s  LVOT VTI:    0.185 m     AORTA  Ao Root diam: 3.30 cm      SHUNTS  Systemic VTI:  0.18 m  Systemic Diam: 2.10 cm   Antimicrobials:  Anti-infectives (From admission, onward)    None       Subjective: Seen and examined at bedside and continues to be extremely anxious and jittery.  Still withdrawing and denies any chest pain or shortness of breath.  Thinks he is doing a little bit better.  Wanting to go outside.  No other concerns or complaints at this time.   Objective: Vitals:   01/13/21 0452 01/13/21 1258 01/13/21 2000 01/14/21 0544  BP: (!) 154/95 (!) 149/89 (!) 165/109 (!) 150/86  Pulse: 81 89 87 79  Resp:  20  14  Temp: 97.7 F (36.5 C) 97.9 F (36.6 C) 98.7 F (37.1 C) 98.3 F (36.8 C)  TempSrc: Oral Axillary Oral Oral  SpO2: 99% 98% 98% 94%  Weight:      Height:        Intake/Output Summary (Last 24 hours) at 01/14/2021 1245 Last data filed at 01/14/2021 0900 Gross per 24 hour   Intake 1358.81 ml  Output --  Net 1358.81 ml    Filed Weights   01/10/21 1142 01/10/21 1924 01/11/21 1726  Weight: 99.8 kg 99.8 kg 99.8 kg   Examination: Physical Exam:  Constitutional: WN/WD overweight Caucasian male who is very anxious and still tremulous Eyes: Lids and conjunctivae normal, sclerae anicteric  ENMT: External Ears, Nose appear normal. Grossly normal hearing. Mucous membranes are moist. Neck: Appears normal, supple, no cervical masses, normal ROM, no appreciable thyromegaly; no JVD Respiratory: Diminished to auscultation bilaterally with coarse breath sounds, no wheezing, rales, rhonchi or crackles. Normal respiratory effort and patient is not tachypenic. No accessory muscle use. Unlabored breathing.  Cardiovascular: RRR, no murmurs / rubs / gallops. S1 and S2 auscultated. 1+ LE edema Abdomen: Soft, non-tender, Distended 2/2 body habitus. Bowel sounds positive.  GU: Deferred. Musculoskeletal: No clubbing / cyanosis of digits/nails. No joint deformity upper and lower extremities. Skin: No rashes, lesions, ulcers on a limited skin evaluation. No induration; Warm and dry.  Neurologic: CN 2-12 grossly intact with no focal deficits. Romberg sign and cerebellar reflexes not assessed.  Psychiatric: Normal judgment and insight. Alert and oriented x 3. Normal mood and appropriate affect.   Data Reviewed: I have personally reviewed following labs and imaging studies  CBC: Recent Labs  Lab 01/10/21 1746 01/12/21 0353 01/13/21 0344 01/14/21 0339  WBC 11.4* 6.9 8.4 9.7  NEUTROABS 9.5* 4.6 5.5 6.3  HGB 16.3 13.8 13.1 14.7  HCT 44.7 39.1 38.3* 42.9  MCV 81.3 84.4 87.2 88.8  PLT 295 202 225 829   Basic Metabolic Panel: Recent Labs  Lab 01/10/21 1746 01/10/21 2330 01/11/21 0430 01/12/21 0353 01/13/21 0344 01/14/21 0339  NA 120* 122* 126* 135 133* 139  K 3.3*  --  3.5 4.3 3.9 4.5  CL 80*  --  87* 100 100 103  CO2 23  --  29 28 27 30   GLUCOSE 153*  --  117* 115*  125* 104*  BUN <5*  --  6 8 7 10   CREATININE 0.43*  --  0.49* 0.47* 0.63 0.78  CALCIUM 9.5  --  9.2 9.1 8.6* 9.5  MG  --   --   --  2.4 2.1 2.4  PHOS  --   --   --  2.5 3.9 4.2    GFR: Estimated Creatinine Clearance: 128.1 mL/min (by C-G formula based on SCr of 0.78 mg/dL). Liver Function Tests: Recent Labs  Lab 01/10/21 2330 01/11/21 0430 01/12/21 0353 01/13/21 0344 01/14/21 0339  AST 39 36 30 26 24   ALT 32 30 26 28  32  ALKPHOS 110 114 93 89 109  BILITOT 1.7* 1.9* 0.6 0.8 0.9  PROT 7.3 7.4 6.6 6.6 7.2  ALBUMIN 4.3 4.4 3.9 4.0 4.3    Recent Labs  Lab 01/10/21 2330  LIPASE 29    No results for input(s): AMMONIA in the last 168 hours. Coagulation Profile: No results for input(s): INR, PROTIME in the last 168 hours. Cardiac Enzymes: No results for input(s): CKTOTAL, CKMB, CKMBINDEX, TROPONINI in the last 168 hours. BNP (last 3 results) No results for input(s): PROBNP in the last 8760 hours. HbA1C: Recent Labs    01/14/21 0339  HGBA1C 6.0*   CBG: No results for input(s): GLUCAP in the last 168 hours. Lipid Profile: No  results for input(s): CHOL, HDL, LDLCALC, TRIG, CHOLHDL, LDLDIRECT in the last 72 hours. Thyroid Function Tests: No results for input(s): TSH, T4TOTAL, FREET4, T3FREE, THYROIDAB in the last 72 hours. Anemia Panel: No results for input(s): VITAMINB12, FOLATE, FERRITIN, TIBC, IRON, RETICCTPCT in the last 72 hours. Sepsis Labs: No results for input(s): PROCALCITON, LATICACIDVEN in the last 168 hours.  Recent Results (from the past 240 hour(s))  Resp Panel by RT-PCR (Flu A&B, Covid) Nasopharyngeal Swab     Status: None   Collection Time: 01/10/21 11:30 PM   Specimen: Nasopharyngeal Swab; Nasopharyngeal(NP) swabs in vial transport medium  Result Value Ref Range Status   SARS Coronavirus 2 by RT PCR NEGATIVE NEGATIVE Final    Comment: (NOTE) SARS-CoV-2 target nucleic acids are NOT DETECTED.  The SARS-CoV-2 RNA is generally detectable in upper  respiratory specimens during the acute phase of infection. The lowest concentration of SARS-CoV-2 viral copies this assay can detect is 138 copies/mL. A negative result does not preclude SARS-Cov-2 infection and should not be used as the sole basis for treatment or other patient management decisions. A negative result may occur with  improper specimen collection/handling, submission of specimen other than nasopharyngeal swab, presence of viral mutation(s) within the areas targeted by this assay, and inadequate number of viral copies(<138 copies/mL). A negative result must be combined with clinical observations, patient history, and epidemiological information. The expected result is Negative.  Fact Sheet for Patients:  EntrepreneurPulse.com.au  Fact Sheet for Healthcare Providers:  IncredibleEmployment.be  This test is no t yet approved or cleared by the [place] FDA and  has been authorized for detection and/or diagnosis of SARS-CoV-2 by FDA under an Emergency Use Authorization (EUA). This EUA will remain  in effect (meaning this test can be used) for the duration of the COVID-19 declaration under Section 564(b)(1) of the Act, 21 U.S.C.section 360bbb-3(b)(1), unless the authorization is terminated  or revoked sooner.       Influenza A by PCR NEGATIVE NEGATIVE Final   Influenza B by PCR NEGATIVE NEGATIVE Final    Comment: (NOTE) The Xpert Xpress SARS-CoV-2/FLU/RSV plus assay is intended as an aid in the diagnosis of influenza from Nasopharyngeal swab specimens and should not be used as a sole basis for treatment. Nasal washings and aspirates are unacceptable for Xpert Xpress SARS-CoV-2/FLU/RSV testing.  Fact Sheet for Patients: EntrepreneurPulse.com.au  Fact Sheet for Healthcare Providers: IncredibleEmployment.be  This test is not yet approved or cleared by the [place] FDA and has been  authorized for detection and/or diagnosis of SARS-CoV-2 by FDA under an Emergency Use Authorization (EUA). This EUA will remain in effect (meaning this test can be used) for the duration of the COVID-19 declaration under Section 564(b)(1) of the Act, 21 U.S.C. section 360bbb-3(b)(1), unless the authorization is terminated or revoked.  Performed at [facility], [place] [address], [place], [place] [address]     RN Pressure Injury Documentation:     Estimated body mass index is 26.79 kg/m as calculated from the following:   Height as of this encounter: 6\' 4"  (1.93 m).   Weight as of this encounter: 99.8 kg.  Malnutrition Type:   Malnutrition Characteristics:   Nutrition Interventions:   Radiology Studies: No results found.  Scheduled Meds:  buprenorphine-naloxone  1 tablet Sublingual TID   enoxaparin (LOVENOX) injection  40 mg Subcutaneous V89F   folic acid  1 mg Oral Daily   gabapentin  200 mg Oral BID   LORazepam  0-4 mg Intravenous  Q8H   metoprolol tartrate  25 mg Oral BID   multivitamin with minerals  1 tablet Oral Daily   nicotine  21 mg Transdermal Daily   potassium chloride  40 mEq Oral BID   thiamine  100 mg Oral Daily   Or   thiamine  100 mg Intravenous Daily   Continuous Infusions:   LOS: 3 days   Kerney Elbe, DO [facility] PAGER is on [place]  If 7PM-7AM, please contact night-coverage [email or web address]

## 2021-01-15 LAB — COMPREHENSIVE METABOLIC PANEL
ALT: 29 U/L (ref 0–44)
AST: 23 U/L (ref 15–41)
Albumin: 3.9 g/dL (ref 3.5–5.0)
Alkaline Phosphatase: 90 U/L (ref 38–126)
Anion gap: 10 (ref 5–15)
BUN: 10 mg/dL (ref 6–20)
CO2: 27 mmol/L (ref 22–32)
Calcium: 9.4 mg/dL (ref 8.9–10.3)
Chloride: 100 mmol/L (ref 98–111)
Creatinine, Ser: 0.57 mg/dL — ABNORMAL LOW (ref 0.61–1.24)
GFR, Estimated: >60 mL/min (ref >60)
Glucose, Bld: 103 mg/dL — ABNORMAL HIGH (ref 70–99)
Potassium: 4.2 mmol/L (ref 3.5–5.1)
Sodium: 137 mmol/L (ref 135–145)
Total Bilirubin: 0.7 mg/dL (ref 0.3–1.2)
Total Protein: 6.8 g/dL (ref 6.5–8.1)

## 2021-01-15 LAB — CBC WITH DIFFERENTIAL/PLATELET
Abs Immature Granulocytes: 0.13 10*3/uL — ABNORMAL HIGH (ref 0.00–0.07)
Basophils Absolute: 0 10*3/uL (ref 0.0–0.1)
Basophils Relative: 0 %
Eosinophils Absolute: 0.4 10*3/uL (ref 0.0–0.5)
Eosinophils Relative: 5 %
HCT: 39.2 % (ref 39.0–52.0)
Hemoglobin: 13.5 g/dL (ref 13.0–17.0)
Immature Granulocytes: 2 %
Lymphocytes Relative: 20 %
Lymphs Abs: 1.7 10*3/uL (ref 0.7–4.0)
MCH: 29.6 pg (ref 26.0–34.0)
MCHC: 34.4 g/dL (ref 30.0–36.0)
MCV: 86 fL (ref 80.0–100.0)
Monocytes Absolute: 0.7 10*3/uL (ref 0.1–1.0)
Monocytes Relative: 9 %
Neutro Abs: 5.3 10*3/uL (ref 1.7–7.7)
Neutrophils Relative %: 64 %
Platelets: 215 10*3/uL (ref 150–400)
RBC: 4.56 MIL/uL (ref 4.22–5.81)
RDW: 13.1 % (ref 11.5–15.5)
WBC: 8.3 10*3/uL (ref 4.0–10.5)
nRBC: 0 % (ref 0.0–0.2)

## 2021-01-15 LAB — MAGNESIUM: Magnesium: 2.1 mg/dL (ref 1.7–2.4)

## 2021-01-15 LAB — PHOSPHORUS: Phosphorus: 4.8 mg/dL — ABNORMAL HIGH (ref 2.5–4.6)

## 2021-01-15 MED ORDER — LORAZEPAM 2 MG/ML IJ SOLN: 1.0000 mg | INTRAMUSCULAR | Status: DC | PRN

## 2021-01-15 MED ORDER — LORAZEPAM 1 MG PO TABS
1.0000 mg | ORAL_TABLET | ORAL | Status: DC | PRN
Start: 2021-01-15 — End: 2021-01-17
  Administered 2021-01-15: 2 mg via ORAL
  Administered 2021-01-15 – 2021-01-16 (×4): 3 mg via ORAL
  Administered 2021-01-16: 2 mg via ORAL
  Administered 2021-01-16: 1 mg via ORAL
  Administered 2021-01-16 (×2): 3 mg via ORAL
  Administered 2021-01-16 (×2): 2 mg via ORAL
  Administered 2021-01-17: 1 mg via ORAL
  Administered 2021-01-17: 2 mg via ORAL
  Filled 2021-01-15: qty 4
  Filled 2021-01-15: qty 2
  Filled 2021-01-15: qty 4
  Filled 2021-01-15 (×2): qty 2
  Filled 2021-01-15 (×2): qty 3
  Filled 2021-01-15: qty 1
  Filled 2021-01-15 (×2): qty 3
  Filled 2021-01-15: qty 1
  Filled 2021-01-15 (×2): qty 2

## 2021-01-15 NOTE — Progress Notes (Signed)
PROGRESS NOTE    Mark [name]  HBZ:[id number] DOB: 1965-10-28 DOA: 01/10/2021 PCP: [name], MD   Brief Narrative:  The patient is a 55 year old overweight Caucasian male with a past medical history significant for but not limited to polysubstance abuse including alcohol and cocaine abuse on Suboxone, hypertension, cardiomegaly, COPD, hyperlipidemia, GERD as well as anxiety and depression as well as other comorbidities who presents with a chief complaint of chest pain and wanting to detox from alcohol.  Patient is homeless and moved from place to place in yesterday's gathering his things when he noted focal nonradiating sharp pressure left-sided of his chest lasted for several minutes.  This resolved since being in the ED and he came in he states that he wants to detox from alcohol.  He states that he did drink 2 beers just to get him to the hospital and reports drinking about a case of beer daily.  Has attempted to detox in the past but reports that he has come to the ED for this previously denies any seizures or hallucinations but has been seeing floaters here and there and reports a possible seizure from alcohol withdrawal about 3 to 5 years ago.  States this is the worst he has felt trying to avoid alcohol.  He was admitted for his chest pain as well as alcohol abuse with concern for withdrawal. His Na+ has improved and resolved so will reduce IVF to NS at 50 mL/hr for 10 more hours yesterday but Na+ dropped again so will resume IVF with NS at 75 mL/hr.  Patient is currently actively withdrawing and is very anxious and tremulous still.  Nursing yesterday stated that his tremors were little bit better than the day before but he continues to withdraw.  His as needed Ativan timed out so will resume.  Assessment & Plan:   Active Problems:   Essential hypertension   Hypokalemia   Polysubstance abuse ([name])   Hyponatremia   Alcohol withdrawal ([name])   Chest pain  Hyponatremia,  improved -Likely secondary to beer potomania and heavy alcohol abuse -Admitted with a sodium of 120 and this was normal about 6 months ago at 139; sodium trended back up to 139 and is now 137 -IVF now stopped  -Has mild LE edema   -Continue to monitor closely and avoid overcorrection  -Repeat CMP in a.m.  Hypokalemia -Mild. K+ 3.3 -> 3.5 -> 4.3 -> 3.9 -> 4.5 -> 4.2 -Continue to monitor and replete as necessary and magnesium level was not checked so we will check -Repeat CMP in a.m.  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 with his last echo showing EF of 50 to 55% -Repeat ECHO as below and showed "Normal biventricular function without evidence of hemodynamically significant valvular heart disease."  Heavy alcohol use with Acute Alcohol withdrawl -Last beer was yesterday afternoon to get him to the hospital -Continue monitor for signs and symptoms of withdrawal; Was jittery this AM so gave a dose of IV Lorazepam 2 mg x1 -Placed on a CIWA protocol and started on folic acid, multivitamin with minerals and thiamine daily; Will escalate to SDU withdrawal protocol  -Was to add Librium but patient has an "adverse" reaction to Librium -CWA Score this AM was 6 and the one before that was 11; Ranging from 5-19;  Now the last 3 CIWA scores were 16, 14, 14 -He was actively tremulous and anxious and had abdominal bloating this AM -Will add Hydroxyzine 25 mg po TIDprn  -Will need TOC for substance abuse and resources   Elevated Anion Gap, improved  -In the setting of heavy alcohol abuse -Alcohol level was less than 10 on arrival -Presented with a CO2 of 23, anion gap of 17, chloride level of 80 -Patient's CO2 is now 27, chloride level is now 100, and anion gap is 10 -Continue to monitor and trend and repeat CMP in the  AM  Polysubstance Abuse -Counseling given and continue with Buprenoprhne-Naloxone 8-2 mg per SL tab 1 tab 3 times daily -UDS was negative  Hypertension -Uncontrolled; Restarted Metoprolol 25 mg po BID -Has not been compliant with his antihypertensives in the past -Last blood pressure reading was 132/83 -Continue to monitor blood pressures per protocol -C/w Hydralazine 5 mg IV q6hprn HBP for SBP >180 or DBP >100  Hyperbilirubinemia -Patient's T bili trended up to 1.9 and is now 0.7 -Continue to monitor and trend and repeat CMP in a.m. if necessary will obtain a right upper quadrant ultrasound and acute hepatitis panel  Leukocytosis -WBC went from 8.5 -> 11.4 -> 6.9 -> 8.4 -> 9.7 -> 8.3 -Likely reactive -Continue to monitor and trend and repeat CBC in a.m.  Tobacco Abuse -Smokes 2 PPD -Will provide Nicotine 21 mg TD Patch -Smoking cessation counseling given  Hyperglycemia in the setting of Pre-Diabetes -Likely reactive but will need to evaluate for diabetes mellitus -Blood sugar on admission was 153 and repeat this morning is 103 -Checked HbA1c and was 6.0.; Last HbA1c was 5.5 -Continue to monitor blood sugars carefully and if necessary will place on sensitive NovoLog sliding scale insulin AC  DVT prophylaxis: Enoxaparin 40 mg sq q24h  Code Status: FULL CODE  Family Communication: No family present at bedside  Disposition Plan: Pending further clinical improvement and improvement in his Withdrawals  Status is: Inpatient   The patient will require care spanning > 2 midnights and should be moved to inpatient because: Concern for Alcohol withdrawal   Consultants:  None  Procedures:  ECHOCARDIOGRAM IMPRESSIONS     1. Left ventricular ejection fraction, by estimation, is 55 to 60%. The  left ventricle has normal function. The left ventricle has no regional  wall motion abnormalities. Indeterminate diastolic filling due to E-A  fusion.   2. Right ventricular systolic  function is normal. The right ventricular  size is normal. Tricuspid regurgitation signal is inadequate for assessing  PA pressure.   3. The mitral valve is grossly normal. No evidence of mitral valve  regurgitation. No evidence of mitral stenosis.   4. The aortic valve was not well visualized. Aortic valve regurgitation  is not visualized. No aortic stenosis is present.   5. The inferior vena cava is normal in size with greater than 50%  respiratory variability, suggesting right atrial pressure of 3 mmHg.   Comparison(s): No significant change from prior study.   Conclusion(s)/Recommendation(s): Normal biventricular function without  evidence of hemodynamically significant valvular heart disease.   FINDINGS   Left Ventricle: Left ventricular ejection fraction, by estimation, is 55  to 60%. The left ventricle has normal function. The left ventricle has no  regional wall motion abnormalities. The left ventricular internal cavity  size was normal in size. There is   no left ventricular hypertrophy. Indeterminate diastolic filling due to  E-A fusion.   Right Ventricle: The right  ventricular size is normal. No increase in  right ventricular wall thickness. Right ventricular systolic function is  normal. Tricuspid regurgitation signal is inadequate for assessing PA  pressure.   Left Atrium: Left atrial size was normal in size.   Right Atrium: Right atrial size was normal in size.   Pericardium: There is no evidence of pericardial effusion.   Mitral Valve: The mitral valve is grossly normal. No evidence of mitral  valve regurgitation. No evidence of mitral valve stenosis.   Tricuspid Valve: The tricuspid valve is grossly normal. Tricuspid valve  regurgitation is not demonstrated. No evidence of tricuspid stenosis.   Aortic Valve: The aortic valve was not well visualized. Aortic valve  regurgitation is not visualized. No aortic stenosis is present.   Pulmonic Valve: The pulmonic  valve was not well visualized. Pulmonic valve  regurgitation is not visualized.   Aorta: The aortic root is normal in size and structure.   Venous: The inferior vena cava is normal in size with greater than 50%  respiratory variability, suggesting right atrial pressure of 3 mmHg.   IAS/Shunts: The atrial septum is grossly normal.      LEFT VENTRICLE  PLAX 2D  LVIDd:         5.20 cm  LVIDs:         4.00 cm  LV PW:         1.20 cm  LV IVS:        1.00 cm  LVOT diam:     2.10 cm  LV SV:         64  LV SV Index:   28  LVOT Area:     3.46 cm      RIGHT VENTRICLE  RV S prime:     10.80 cm/s  TAPSE (M-mode): 2.3 cm   LEFT ATRIUM             Index        RIGHT ATRIUM           Index  LA diam:        4.10 cm 1.78 cm/m   RA Area:     18.60 cm  LA Vol (A2C):   64.3 ml 27.87 ml/m  RA Volume:   49.20 ml  21.33 ml/m  LA Vol (A4C):   51.3 ml 22.24 ml/m  LA Biplane Vol: 59.2 ml 25.66 ml/m   AORTIC VALVE  LVOT Vmax:   96.00 cm/s  LVOT Vmean:  74.100 cm/s  LVOT VTI:    0.185 m     AORTA  Ao Root diam: 3.30 cm      SHUNTS  Systemic VTI:  0.18 m  Systemic Diam: 2.10 cm   Antimicrobials:  Anti-infectives (From admission, onward)    None       Subjective: Seen and examined at bedside and he is withdrawing significantly this morning.  Still continues to be tremulous and jittery.  Denies any chest pain or shortness of breath.  Continues to be nauseous but no vomiting.  No other concerns or complaints at this time.  Objective: Vitals:   01/14/21 2000 01/15/21 0057 01/15/21 0500 01/15/21 0800  BP: (!) 161/105 128/74 132/83   Pulse: 97 85 91   Resp: 20  (!) 27 15  Temp: 98.6 F (37 C) 97.8 F (36.6 C) 97.8 F (36.6 C)   TempSrc: Axillary Oral Oral   SpO2: 97% 98% 95%   Weight:   102.1 kg   Height:  Intake/Output Summary (Last 24 hours) at 01/15/2021 1251 Last data filed at 01/15/2021 0900 Gross per 24 hour  Intake 1440 ml  Output 501 ml  Net 939 ml     Filed Weights   01/10/21 1924 01/11/21 1726 01/15/21 0500  Weight: 99.8 kg 99.8 kg 102.1 kg   Examination: Physical Exam:  Constitutional: WN/WD overweight Caucasian male who is extremely anxious and very tremulous Eyes: Lids and conjunctivae normal, sclerae anicteric  ENMT: External Ears, Nose appear normal. Grossly normal hearing. Mucous membranes are moist.  Neck: Appears normal, supple, no cervical masses, normal ROM, no appreciable thyromegaly; no appreciable JVD Respiratory: Diminished to auscultation bilaterally, no wheezing, rales, rhonchi or crackles. Normal respiratory effort and patient is not tachypenic. No accessory muscle use.  Unlabored breathing Cardiovascular: RRR, no murmurs / rubs / gallops. S1 and S2 auscultated.  1+ lower extremity edema Abdomen: Soft, non-tender, Distended 2/2 body habitus. Bowel sounds positive.  GU: Deferred. Musculoskeletal: No clubbing / cyanosis of digits/nails. No joint deformity upper and lower extremities.  Skin: No rashes, lesions, ulcers on a limited skin evaluation. No induration; Warm and dry.  Neurologic: CN 2-12 grossly intact with no focal deficits. Romberg sign and cerebellar reflexes not assessed.  Psychiatric: Normal judgment and insight. Alert and oriented x 3. Anxious mood and appropriate affect.   Data Reviewed: I have personally reviewed following labs and imaging studies  CBC: Recent Labs  Lab 01/10/21 1746 01/12/21 0353 01/13/21 0344 01/14/21 0339 01/15/21 0357  WBC 11.4* 6.9 8.4 9.7 8.3  NEUTROABS 9.5* 4.6 5.5 6.3 5.3  HGB 16.3 13.8 13.1 14.7 13.5  HCT 44.7 39.1 38.3* 42.9 39.2  MCV 81.3 84.4 87.2 88.8 86.0  PLT 295 202 225 250 242   Basic Metabolic Panel: Recent Labs  Lab 01/11/21 0430 01/12/21 0353 01/13/21 0344 01/14/21 0339 01/15/21 0357  NA 126* 135 133* 139 137  K 3.5 4.3 3.9 4.5 4.2  CL 87* 100 100 103 100  CO2 29 28 27 30 27   GLUCOSE 117* 115* 125* 104* 103*  BUN 6 8 7 10 10   CREATININE  0.49* 0.47* 0.63 0.78 0.57*  CALCIUM 9.2 9.1 8.6* 9.5 9.4  MG  --  2.4 2.1 2.4 2.1  PHOS  --  2.5 3.9 4.2 4.8*   GFR: Estimated Creatinine Clearance: 128.1 mL/min (A) (by C-G formula based on SCr of 0.57 mg/dL (L)). Liver Function Tests: Recent Labs  Lab 01/11/21 0430 01/12/21 0353 01/13/21 0344 01/14/21 0339 01/15/21 0357  AST 36 30 26 24 23   ALT 30 26 28  32 29  ALKPHOS 114 93 89 109 90  BILITOT 1.9* 0.6 0.8 0.9 0.7  PROT 7.4 6.6 6.6 7.2 6.8  ALBUMIN 4.4 3.9 4.0 4.3 3.9    Recent Labs  Lab 01/10/21 2330  LIPASE 29    No results for input(s): AMMONIA in the last 168 hours. Coagulation Profile: No results for input(s): INR, PROTIME in the last 168 hours. Cardiac Enzymes: No results for input(s): CKTOTAL, CKMB, CKMBINDEX, TROPONINI in the last 168 hours. BNP (last 3 results) No results for input(s): PROBNP in the last 8760 hours. HbA1C: Recent Labs    01/14/21 0339  HGBA1C 6.0*    CBG: No results for input(s): GLUCAP in the last 168 hours. Lipid Profile: No results for input(s): CHOL, HDL, LDLCALC, TRIG, CHOLHDL, LDLDIRECT in the last 72 hours. Thyroid Function Tests: No results for input(s): TSH, T4TOTAL, FREET4, T3FREE, THYROIDAB in the last 72 hours. Anemia Panel: No results for  input(s): VITAMINB12, FOLATE, FERRITIN, TIBC, IRON, RETICCTPCT in the last 72 hours. Sepsis Labs: No results for input(s): PROCALCITON, LATICACIDVEN in the last 168 hours.  Recent Results (from the past 240 hour(s))  Resp Panel by RT-PCR (Flu A&B, Covid) Nasopharyngeal Swab     Status: None   Collection Time: 01/10/21 11:30 PM   Specimen: Nasopharyngeal Swab; Nasopharyngeal(NP) swabs in vial transport medium  Result Value Ref Range Status   SARS Coronavirus 2 by RT PCR NEGATIVE NEGATIVE Final    Comment: (NOTE) SARS-CoV-2 target nucleic acids are NOT DETECTED.  The SARS-CoV-2 RNA is generally detectable in upper respiratory specimens during the acute phase of infection. The  lowest concentration of SARS-CoV-2 viral copies this assay can detect is 138 copies/mL. A negative result does not preclude SARS-Cov-2 infection and should not be used as the sole basis for treatment or other patient management decisions. A negative result may occur with  improper specimen collection/handling, submission of specimen other than nasopharyngeal swab, presence of viral mutation(s) within the areas targeted by this assay, and inadequate number of viral copies(<138 copies/mL). A negative result must be combined with clinical observations, patient history, and epidemiological information. The expected result is Negative.  Fact Sheet for Patients:  EntrepreneurPulse.com.au  Fact Sheet for Healthcare Providers:  IncredibleEmployment.be  This test is no t yet approved or cleared by the [place] FDA and  has been authorized for detection and/or diagnosis of SARS-CoV-2 by FDA under an Emergency Use Authorization (EUA). This EUA will remain  in effect (meaning this test can be used) for the duration of the COVID-19 declaration under Section 564(b)(1) of the Act, 21 U.S.C.section 360bbb-3(b)(1), unless the authorization is terminated  or revoked sooner.       Influenza A by PCR NEGATIVE NEGATIVE Final   Influenza B by PCR NEGATIVE NEGATIVE Final    Comment: (NOTE) The Xpert Xpress SARS-CoV-2/FLU/RSV plus assay is intended as an aid in the diagnosis of influenza from Nasopharyngeal swab specimens and should not be used as a sole basis for treatment. Nasal washings and aspirates are unacceptable for Xpert Xpress SARS-CoV-2/FLU/RSV testing.  Fact Sheet for Patients: EntrepreneurPulse.com.au  Fact Sheet for Healthcare Providers: IncredibleEmployment.be  This test is not yet approved or cleared by the [place] FDA and has been authorized for detection and/or diagnosis of SARS-CoV-2 by FDA under  an Emergency Use Authorization (EUA). This EUA will remain in effect (meaning this test can be used) for the duration of the COVID-19 declaration under Section 564(b)(1) of the Act, 21 U.S.C. section 360bbb-3(b)(1), unless the authorization is terminated or revoked.  Performed at [facility], [place] [address], [place], [place] [address]     RN Pressure Injury Documentation:     Estimated body mass index is 27.4 kg/m as calculated from the following:   Height as of this encounter: 6\' 4"  (1.93 m).   Weight as of this encounter: 102.1 kg.  Malnutrition Type:   Malnutrition Characteristics:   Nutrition Interventions:   Radiology Studies: No results found.  Scheduled Meds:  buprenorphine-naloxone  1 tablet Sublingual TID   enoxaparin (LOVENOX) injection  40 mg Subcutaneous H21Y   folic acid  1 mg Oral Daily   gabapentin  200 mg Oral BID   LORazepam  0-4 mg Intravenous Q8H   metoprolol tartrate  25 mg Oral BID   multivitamin with minerals  1 tablet Oral Daily   nicotine  21 mg Transdermal Daily   potassium chloride  40 mEq Oral  BID   tamsulosin  0.4 mg Oral QPC supper   thiamine  100 mg Oral Daily   Or   thiamine  100 mg Intravenous Daily   Continuous Infusions:   LOS: 4 days   Kerney Elbe, DO [facility] PAGER is on AMION  If 7PM-7AM, please contact night-coverage [email or web address]

## 2021-01-16 ENCOUNTER — Inpatient Hospital Stay (HOSPITAL_COMMUNITY): Payer: Self-pay

## 2021-01-16 DIAGNOSIS — M7989 Other specified soft tissue disorders: Secondary | ICD-10-CM

## 2021-01-16 LAB — PHOSPHORUS: Phosphorus: 4 mg/dL (ref 2.5–4.6)

## 2021-01-16 LAB — CBC WITH DIFFERENTIAL/PLATELET
Abs Immature Granulocytes: 0.16 10*3/uL — ABNORMAL HIGH (ref 0.00–0.07)
Basophils Absolute: 0 10*3/uL (ref 0.0–0.1)
Basophils Relative: 0 %
Eosinophils Absolute: 0.4 10*3/uL (ref 0.0–0.5)
Eosinophils Relative: 5 %
HCT: 37.7 % — ABNORMAL LOW (ref 39.0–52.0)
Hemoglobin: 13 g/dL (ref 13.0–17.0)
Immature Granulocytes: 2 %
Lymphocytes Relative: 19 %
Lymphs Abs: 1.5 10*3/uL (ref 0.7–4.0)
MCH: 30.1 pg (ref 26.0–34.0)
MCHC: 34.5 g/dL (ref 30.0–36.0)
MCV: 87.3 fL (ref 80.0–100.0)
Monocytes Absolute: 0.8 10*3/uL (ref 0.1–1.0)
Monocytes Relative: 10 %
Neutro Abs: 5 10*3/uL (ref 1.7–7.7)
Neutrophils Relative %: 64 %
Platelets: 202 10*3/uL (ref 150–400)
RBC: 4.32 MIL/uL (ref 4.22–5.81)
RDW: 12.9 % (ref 11.5–15.5)
WBC: 7.9 10*3/uL (ref 4.0–10.5)
nRBC: 0 % (ref 0.0–0.2)

## 2021-01-16 LAB — MAGNESIUM: Magnesium: 2.1 mg/dL (ref 1.7–2.4)

## 2021-01-16 LAB — COMPREHENSIVE METABOLIC PANEL
ALT: 31 U/L (ref 0–44)
AST: 22 U/L (ref 15–41)
Albumin: 3.6 g/dL (ref 3.5–5.0)
Alkaline Phosphatase: 83 U/L (ref 38–126)
Anion gap: 8 (ref 5–15)
BUN: 9 mg/dL (ref 6–20)
CO2: 25 mmol/L (ref 22–32)
Calcium: 8.8 mg/dL — ABNORMAL LOW (ref 8.9–10.3)
Chloride: 100 mmol/L (ref 98–111)
Creatinine, Ser: 0.53 mg/dL — ABNORMAL LOW (ref 0.61–1.24)
GFR, Estimated: >60 mL/min (ref >60)
Glucose, Bld: 177 mg/dL — ABNORMAL HIGH (ref 70–99)
Potassium: 3.9 mmol/L (ref 3.5–5.1)
Sodium: 133 mmol/L — ABNORMAL LOW (ref 135–145)
Total Bilirubin: 0.7 mg/dL (ref 0.3–1.2)
Total Protein: 6.3 g/dL — ABNORMAL LOW (ref 6.5–8.1)

## 2021-01-16 MED ORDER — BISACODYL 10 MG RE SUPP: 10.0000 mg | Freq: Every day | RECTAL | Status: DC | PRN

## 2021-01-16 MED ORDER — POLYETHYLENE GLYCOL 3350 17 G PO PACK
17.0000 g | PACK | Freq: Two times a day (BID) | ORAL | Status: DC
  Administered 2021-01-16 (×2): 17 g via ORAL
  Filled 2021-01-16 (×5): qty 1

## 2021-01-16 MED ORDER — SENNOSIDES-DOCUSATE SODIUM 8.6-50 MG PO TABS
1.0000 | ORAL_TABLET | Freq: Two times a day (BID) | ORAL | Status: DC
  Administered 2021-01-16 – 2021-01-18 (×3): 1 via ORAL
  Filled 2021-01-16 (×5): qty 1

## 2021-01-16 MED ORDER — FUROSEMIDE 10 MG/ML IJ SOLN
20.0000 mg | Freq: Once | INTRAMUSCULAR | Status: AC
  Administered 2021-01-16: 20 mg via INTRAVENOUS
  Filled 2021-01-16: qty 2

## 2021-01-16 MED ORDER — DIPHENHYDRAMINE HCL 25 MG PO CAPS: 25.0000 mg | ORAL_CAPSULE | Freq: Every evening | ORAL | Status: DC | PRN

## 2021-01-16 NOTE — Progress Notes (Signed)
BLE venous duplex has been completed.  Results can be found under chart review under CV PROC. 01/16/2021 11:50 AM [place] RVT, RDMS

## 2021-01-16 NOTE — Progress Notes (Addendum)
PROGRESS NOTE    Mark [name]  OFB:[id number] DOB: [birth year] DOA: 01/10/2021 PCP: [name], MD   Brief Narrative:  The patient is a 55 year old overweight Caucasian male with a past medical history significant for but not limited to polysubstance abuse including alcohol and cocaine abuse on Suboxone, hypertension, cardiomegaly, COPD, hyperlipidemia, GERD as well as anxiety and depression as well as other comorbidities who presents with a chief complaint of chest pain and wanting to detox from alcohol.  Patient is homeless and moved from place to place in yesterday's gathering his things when he noted focal nonradiating sharp pressure left-sided of his chest lasted for several minutes.  This resolved since being in the ED and he came in he states that he wants to detox from alcohol.  He states that he did drink 2 beers just to get him to the hospital and reports drinking about a case of beer daily.  Has attempted to detox in the past but reports that he has come to the ED for this previously denies any seizures or hallucinations but has been seeing floaters here and there and reports a possible seizure from alcohol withdrawal about 3 to 5 years ago.  States this is the worst he has felt trying to avoid alcohol.  He was admitted for his chest pain as well as alcohol abuse with concern for withdrawal. His Na+ has improved and resolved so will reduce IVF to NS at 50 mL/hr for 10 more hours yesterday but Na+ dropped again so will resume IVF with NS at 75 mL/hr.  Patient is currently actively withdrawing and is very anxious and tremulous still.  Nursing yesterday stated that his tremors were little bit better than the day before but he continues to withdraw.  His as needed Ativan timed out so was resumed. Patient is slowly making progress but because he continues to have elevated CIWA Scores. Will avoid Librium given "adverse" reaction to it.   Assessment & Plan:   Active Problems:   Essential  hypertension   Hypokalemia   Polysubstance abuse ([name])   Hyponatremia   Alcohol withdrawal ([name])   Chest pain  Hyponatremia, improved -Likely secondary to beer potomania and heavy alcohol abuse -Admitted with a sodium of 120 and this was normal about 6 months ago at 139; sodium trended back up to 139 and now back down to 133 -IVF now stopped  -Has mild 1+ LE edema and was given a dose of Lasix  -Continue to monitor closely and avoid overcorrection  -Repeat CMP in a.m.  Hypokalemia -Mild and improved. K+ is now 3.9 -Continue to monitor and replete as necessary and magnesium level was not checked so we will check -Repeat CMP in a.m.  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 with his last echo showing EF of 50 to 55% -Repeat ECHO as below and showed "Normal biventricular function without evidence of hemodynamically significant valvular heart disease."  Heavy alcohol use with Acute Alcohol withdrawl -Last beer was yesterday afternoon to get him to the hospital -Continue monitor for signs and symptoms of withdrawal; Was jittery this AM so gave a dose of IV Lorazepam 2 mg x1 -Placed on a CIWA protocol and started on folic acid, multivitamin with minerals and thiamine daily; Will escalate to SDU withdrawal protocol  -Was to  add Librium but patient has an "adverse" reaction to Librium -CWA Score this AM was 6 and the one before that was 11; Ranging from 2-16; Now the last 3 CIWA scores were 2, 16, 16 -Tremors are improving slightly  -Will add Hydroxyzine 25 mg po TIDprn  -Will need TOC for substance abuse and resources   Elevated Anion Gap, improved  -In the setting of heavy alcohol abuse -Alcohol level was less than 10 on arrival -Presented with a CO2 of 23, anion gap of 17, chloride level of 80 -Patient's CO2  is now 25, chloride level is now 100, and anion gap is 8 -Continue to monitor and trend and repeat CMP in the AM  Polysubstance Abuse -Counseling given and continue with Buprenoprhne-Naloxone 8-2 mg per SL tab 1 tab 3 times daily -UDS was negative  Hypertension -Uncontrolled; Restarted Metoprolol 25 mg po BID -Has not been compliant with his antihypertensives in the past -Last blood pressure reading was 144/101 -Continue to monitor blood pressures per protocol -C/w Hydralazine 5 mg IV q6hprn HBP for SBP >180 or DBP >100  Hyperbilirubinemia -Patient's T bili trended up to 1.9 -> 0.7 x2 -Continue to monitor and trend and repeat CMP in a.m. if necessary will obtain a right upper quadrant ultrasound and acute hepatitis panel  Leukocytosis -WBC went from 8.5 -> 11.4 -> 6.9 -> 8.4 -> 9.7 -> 8.3 -> 7.9 -Likely reactive -Continue to monitor and trend and repeat CBC in a.m.  Tobacco Abuse -Smokes 2 PPD -Will provide Nicotine 21 mg TD Patch -Smoking cessation counseling given  Groin Lymphadenopathy -Likely Reactive and seen on his lower extremity venous duplex -Obtain PSA in the outpatient setting and outpatient follow up and evaluation  Bilateral lower extremity pitting edema -Likely in the setting of IV fluid resuscitation.  Repeat echo as above -Patient's albumin level was normal at 3.6 -Given a dose of IV Lasix -Check lower extremity venous duplex and was negative for DVT  Hyperglycemia in the setting of Pre-Diabetes -Blood sugar on admission was 153 and repeat this morning is 177 -Checked HbA1c and was 6.0.; Last HbA1c was 5.5 -Continue to monitor blood sugars carefully and if necessary will place on sensitive NovoLog sliding scale insulin AC  DVT prophylaxis: Enoxaparin 40 mg sq q24h  Code Status: FULL CODE  Family Communication: No family present at bedside but I spoke to his daughter Loma Sousa over the telephone Disposition Plan: Pending further clinical improvement and  improvement in his Withdrawals; Anticipate D/C in the next 24-48 hours if stable and improved   Status is: Inpatient   The patient will require care spanning > 2 midnights and should be moved to inpatient because: Concern for Alcohol withdrawal   Consultants:  None  Procedures:  ECHOCARDIOGRAM IMPRESSIONS     1. Left ventricular ejection fraction, by estimation, is 55 to 60%. The  left ventricle has normal function. The left ventricle has no regional  wall motion abnormalities. Indeterminate diastolic filling due to E-A  fusion.   2. Right ventricular systolic function is normal. The right ventricular  size is normal. Tricuspid regurgitation signal is inadequate for assessing  PA pressure.   3. The mitral valve is grossly normal. No evidence of mitral valve  regurgitation. No evidence of mitral stenosis.   4. The aortic valve was not well visualized. Aortic valve regurgitation  is not visualized. No aortic stenosis is present.   5. The inferior vena cava is normal in size with greater than 50%  respiratory variability,  suggesting right atrial pressure of 3 mmHg.   Comparison(s): No significant change from prior study.   Conclusion(s)/Recommendation(s): Normal biventricular function without  evidence of hemodynamically significant valvular heart disease.   FINDINGS   Left Ventricle: Left ventricular ejection fraction, by estimation, is 55  to 60%. The left ventricle has normal function. The left ventricle has no  regional wall motion abnormalities. The left ventricular internal cavity  size was normal in size. There is   no left ventricular hypertrophy. Indeterminate diastolic filling due to  E-A fusion.   Right Ventricle: The right ventricular size is normal. No increase in  right ventricular wall thickness. Right ventricular systolic function is  normal. Tricuspid regurgitation signal is inadequate for assessing PA  pressure.   Left Atrium: Left atrial size was normal  in size.   Right Atrium: Right atrial size was normal in size.   Pericardium: There is no evidence of pericardial effusion.   Mitral Valve: The mitral valve is grossly normal. No evidence of mitral  valve regurgitation. No evidence of mitral valve stenosis.   Tricuspid Valve: The tricuspid valve is grossly normal. Tricuspid valve  regurgitation is not demonstrated. No evidence of tricuspid stenosis.   Aortic Valve: The aortic valve was not well visualized. Aortic valve  regurgitation is not visualized. No aortic stenosis is present.   Pulmonic Valve: The pulmonic valve was not well visualized. Pulmonic valve  regurgitation is not visualized.   Aorta: The aortic root is normal in size and structure.   Venous: The inferior vena cava is normal in size with greater than 50%  respiratory variability, suggesting right atrial pressure of 3 mmHg.   IAS/Shunts: The atrial septum is grossly normal.      LEFT VENTRICLE  PLAX 2D  LVIDd:         5.20 cm  LVIDs:         4.00 cm  LV PW:         1.20 cm  LV IVS:        1.00 cm  LVOT diam:     2.10 cm  LV SV:         64  LV SV Index:   28  LVOT Area:     3.46 cm      RIGHT VENTRICLE  RV S prime:     10.80 cm/s  TAPSE (M-mode): 2.3 cm   LEFT ATRIUM             Index        RIGHT ATRIUM           Index  LA diam:        4.10 cm 1.78 cm/m   RA Area:     18.60 cm  LA Vol (A2C):   64.3 ml 27.87 ml/m  RA Volume:   49.20 ml  21.33 ml/m  LA Vol (A4C):   51.3 ml 22.24 ml/m  LA Biplane Vol: 59.2 ml 25.66 ml/m   AORTIC VALVE  LVOT Vmax:   96.00 cm/s  LVOT Vmean:  74.100 cm/s  LVOT VTI:    0.185 m     AORTA  Ao Root diam: 3.30 cm      SHUNTS  Systemic VTI:  0.18 m  Systemic Diam: 2.10 cm   Antimicrobials:  Anti-infectives (From admission, onward)    None       Subjective: Seen and examined at bedside and he continues to still have withdrawals and was anxious and jittery with tremors  but not as bad.  Denies any chest pain  or shortness of breath.  Has lower extremity swelling he has 1+ pitting.  No vomiting but had some nausea earlier.  No other concerns or complaints at this time.  Objective: Vitals:   01/16/21 0450 01/16/21 0800 01/16/21 0952 01/16/21 1300  BP: (!) 153/88  (!) 150/105 (!) 144/101  Pulse: 80  99 88  Resp: 16 (!) 21 16 16   Temp: 97.8 F (36.6 C)  98.5 F (36.9 C) 97.7 F (36.5 C)  TempSrc: Oral  Oral Oral  SpO2: 100%  96% 98%  Weight:      Height:        Intake/Output Summary (Last 24 hours) at 01/16/2021 1641 Last data filed at 01/16/2021 1434 Gross per 24 hour  Intake 2640 ml  Output --  Net 2640 ml    Filed Weights   01/10/21 1924 01/11/21 1726 01/15/21 0500  Weight: 99.8 kg 99.8 kg 102.1 kg   Examination: Physical Exam:  Constitutional: WN/WD overweight Caucasian male who is tremulous Eyes: Lids and conjunctivae normal, sclerae anicteric  ENMT: External Ears, Nose appear normal. Grossly normal hearing. Mucous membranes are moist.  Neck: Appears normal, supple, no cervical masses, normal ROM, no appreciable thyromegaly; no appreciable JVD Respiratory: Diminished to auscultation bilaterally, no wheezing, rales, rhonchi or crackles. Normal respiratory effort and patient is not tachypenic. No accessory muscle use.  Unlabored breathing Cardiovascular: RRR, no murmurs / rubs / gallops. S1 and S2 auscultated.  1+ pitting edema bilaterally.  Abdomen: Soft, non-tender, distended secondary body habitus. Bowel sounds positive.  GU: Deferred. Musculoskeletal: No clubbing / cyanosis of digits/nails. No joint deformity upper and lower extremities.  Skin: No rashes, lesions, ulcers on limited skin evaluation. No induration; Warm and dry.  Neurologic: CN 2-12 grossly intact with no focal deficits. Romberg sign and cerebellar reflexes not assessed.  Psychiatric: Normal judgment and insight. Alert and oriented x 3. Normal mood and appropriate affect.   Data Reviewed: I have personally  reviewed following labs and imaging studies  CBC: Recent Labs  Lab 01/12/21 0353 01/13/21 0344 01/14/21 0339 01/15/21 0357 01/16/21 0357  WBC 6.9 8.4 9.7 8.3 7.9  NEUTROABS 4.6 5.5 6.3 5.3 5.0  HGB 13.8 13.1 14.7 13.5 13.0  HCT 39.1 38.3* 42.9 39.2 37.7*  MCV 84.4 87.2 88.8 86.0 87.3  PLT 202 225 250 215 536   Basic Metabolic Panel: Recent Labs  Lab 01/12/21 0353 01/13/21 0344 01/14/21 0339 01/15/21 0357 01/16/21 0357  NA 135 133* 139 137 133*  K 4.3 3.9 4.5 4.2 3.9  CL 100 100 103 100 100  CO2 28 27 30 27 25   GLUCOSE 115* 125* 104* 103* 177*  BUN 8 7 10 10 9   CREATININE 0.47* 0.63 0.78 0.57* 0.53*  CALCIUM 9.1 8.6* 9.5 9.4 8.8*  MG 2.4 2.1 2.4 2.1 2.1  PHOS 2.5 3.9 4.2 4.8* 4.0   GFR: Estimated Creatinine Clearance: 128.1 mL/min (A) (by C-G formula based on SCr of 0.53 mg/dL (L)). Liver Function Tests: Recent Labs  Lab 01/12/21 0353 01/13/21 0344 01/14/21 0339 01/15/21 0357 01/16/21 0357  AST 30 26 24 23 22   ALT 26 28 32 29 31  ALKPHOS 93 89 109 90 83  BILITOT 0.6 0.8 0.9 0.7 0.7  PROT 6.6 6.6 7.2 6.8 6.3*  ALBUMIN 3.9 4.0 4.3 3.9 3.6    Recent Labs  Lab 01/10/21 2330  LIPASE 29    No results for input(s): AMMONIA in the last 168  hours. Coagulation Profile: No results for input(s): INR, PROTIME in the last 168 hours. Cardiac Enzymes: No results for input(s): CKTOTAL, CKMB, CKMBINDEX, TROPONINI in the last 168 hours. BNP (last 3 results) No results for input(s): PROBNP in the last 8760 hours. HbA1C: Recent Labs    01/14/21 0339  HGBA1C 6.0*    CBG: No results for input(s): GLUCAP in the last 168 hours. Lipid Profile: No results for input(s): CHOL, HDL, LDLCALC, TRIG, CHOLHDL, LDLDIRECT in the last 72 hours. Thyroid Function Tests: No results for input(s): TSH, T4TOTAL, FREET4, T3FREE, THYROIDAB in the last 72 hours. Anemia Panel: No results for input(s): VITAMINB12, FOLATE, FERRITIN, TIBC, IRON, RETICCTPCT in the last 72 hours. Sepsis  Labs: No results for input(s): PROCALCITON, LATICACIDVEN in the last 168 hours.  Recent Results (from the past 240 hour(s))  Resp Panel by RT-PCR (Flu A&B, Covid) Nasopharyngeal Swab     Status: None   Collection Time: 01/10/21 11:30 PM   Specimen: Nasopharyngeal Swab; Nasopharyngeal(NP) swabs in vial transport medium  Result Value Ref Range Status   SARS Coronavirus 2 by RT PCR NEGATIVE NEGATIVE Final    Comment: (NOTE) SARS-CoV-2 target nucleic acids are NOT DETECTED.  The SARS-CoV-2 RNA is generally detectable in upper respiratory specimens during the acute phase of infection. The lowest concentration of SARS-CoV-2 viral copies this assay can detect is 138 copies/mL. A negative result does not preclude SARS-Cov-2 infection and should not be used as the sole basis for treatment or other patient management decisions. A negative result may occur with  improper specimen collection/handling, submission of specimen other than nasopharyngeal swab, presence of viral mutation(s) within the areas targeted by this assay, and inadequate number of viral copies(<138 copies/mL). A negative result must be combined with clinical observations, patient history, and epidemiological information. The expected result is Negative.  Fact Sheet for Patients:  EntrepreneurPulse.com.au  Fact Sheet for Healthcare Providers:  IncredibleEmployment.be  This test is no t yet approved or cleared by the [place] FDA and  has been authorized for detection and/or diagnosis of SARS-CoV-2 by FDA under an Emergency Use Authorization (EUA). This EUA will remain  in effect (meaning this test can be used) for the duration of the COVID-19 declaration under Section 564(b)(1) of the Act, 21 U.S.C.section 360bbb-3(b)(1), unless the authorization is terminated  or revoked sooner.       Influenza A by PCR NEGATIVE NEGATIVE Final   Influenza B by PCR NEGATIVE NEGATIVE Final     Comment: (NOTE) The Xpert Xpress SARS-CoV-2/FLU/RSV plus assay is intended as an aid in the diagnosis of influenza from Nasopharyngeal swab specimens and should not be used as a sole basis for treatment. Nasal washings and aspirates are unacceptable for Xpert Xpress SARS-CoV-2/FLU/RSV testing.  Fact Sheet for Patients: EntrepreneurPulse.com.au  Fact Sheet for Healthcare Providers: IncredibleEmployment.be  This test is not yet approved or cleared by the [place] FDA and has been authorized for detection and/or diagnosis of SARS-CoV-2 by FDA under an Emergency Use Authorization (EUA). This EUA will remain in effect (meaning this test can be used) for the duration of the COVID-19 declaration under Section 564(b)(1) of the Act, 21 U.S.C. section 360bbb-3(b)(1), unless the authorization is terminated or revoked.  Performed at [facility], [place] [address] [place], [place] [address]     RN Pressure Injury Documentation:     Estimated body mass index is 27.4 kg/m as calculated from the following:   Height as of this encounter: 6\' 4"  (1.93 m).  Weight as of this encounter: 102.1 kg.  Malnutrition Type:   Malnutrition Characteristics:   Nutrition Interventions:   Radiology Studies: VAS [place] LOWER EXTREMITY VENOUS (DVT)  Result Date: 01/16/2021  Lower Venous DVT Study Patient Name:  Jacquez JOBANI SABADO  Date of Exam:   01/16/2021 Medical Rec #: [id number]            Accession #:    [id number] Date of Birth: [birth year]            Patient Gender: M Patient Age:   40 years Exam Location:  [facility] Procedure:      VAS [place] LOWER EXTREMITY VENOUS (DVT) Referring Phys: Raiford Noble --------------------------------------------------------------------------------  Indications: Swelling.  Comparison Study: Previous exam on 12/10/2011 was negative for DVT. Performing Technologist: Rogelia Rohrer RVT, RDMS  Examination Guidelines: A  complete evaluation includes B-mode imaging, spectral Doppler, color Doppler, and power Doppler as needed of all accessible portions of each vessel. Bilateral testing is considered an integral part of a complete examination. Limited examinations for reoccurring indications may be performed as noted. The reflux portion of the exam is performed with the patient in reverse Trendelenburg.  +---------+---------------+---------+-----------+----------+-------------------+ RIGHT    CompressibilityPhasicitySpontaneityPropertiesThrombus Aging      +---------+---------------+---------+-----------+----------+-------------------+ CFV      Full           Yes      Yes                                      +---------+---------------+---------+-----------+----------+-------------------+ SFJ      Full                                                             +---------+---------------+---------+-----------+----------+-------------------+ FV Prox  Full           Yes      Yes                                      +---------+---------------+---------+-----------+----------+-------------------+ FV Mid   Full           Yes      Yes                                      +---------+---------------+---------+-----------+----------+-------------------+ FV DistalFull           Yes      Yes                                      +---------+---------------+---------+-----------+----------+-------------------+ PFV      Full                                                             +---------+---------------+---------+-----------+----------+-------------------+ POP      Full  Yes      Yes                                      +---------+---------------+---------+-----------+----------+-------------------+ PTV      Full                                                             +---------+---------------+---------+-----------+----------+-------------------+ PERO     Full                                          Not well visualized +---------+---------------+---------+-----------+----------+-------------------+   +---------+---------------+---------+-----------+----------+-------------------+ LEFT     CompressibilityPhasicitySpontaneityPropertiesThrombus Aging      +---------+---------------+---------+-----------+----------+-------------------+ CFV      Full           Yes      Yes                                      +---------+---------------+---------+-----------+----------+-------------------+ SFJ      Full                                                             +---------+---------------+---------+-----------+----------+-------------------+ FV Prox  Full           Yes      Yes                                      +---------+---------------+---------+-----------+----------+-------------------+ FV Mid   Full           Yes      Yes                                      +---------+---------------+---------+-----------+----------+-------------------+ FV DistalFull           Yes      Yes                                      +---------+---------------+---------+-----------+----------+-------------------+ PFV      Full                                                             +---------+---------------+---------+-----------+----------+-------------------+ POP      Full           Yes      Yes                                      +---------+---------------+---------+-----------+----------+-------------------+  PTV      Full                                                             +---------+---------------+---------+-----------+----------+-------------------+ PERO     Full                                         Not well visualized +---------+---------------+---------+-----------+----------+-------------------+     Summary: BILATERAL: - No evidence of deep vein thrombosis seen in the lower extremities, bilaterally.  - No evidence of superficial venous thrombosis in the lower extremities, bilaterally. -No evidence of popliteal cyst, bilaterally. RIGHT: - Ultrasound characteristics of enlarged lymph nodes are noted in the groin.  LEFT: - Ultrasound characteristics of enlarged lymph nodes noted in the groin.  *See table(s) above for measurements and observations. Electronically signed by Deitra Mayo MD on 01/16/2021 at 1:44:32 PM.    Final     Scheduled Meds:  buprenorphine-naloxone  1 tablet Sublingual TID   enoxaparin (LOVENOX) injection  40 mg Subcutaneous W44Q   folic acid  1 mg Oral Daily   gabapentin  200 mg Oral BID   metoprolol tartrate  25 mg Oral BID   multivitamin with minerals  1 tablet Oral Daily   nicotine  21 mg Transdermal Daily   polyethylene glycol  17 g Oral BID   potassium chloride  40 mEq Oral BID   senna-docusate  1 tablet Oral BID   tamsulosin  0.4 mg Oral QPC supper   thiamine  100 mg Oral Daily   Or   thiamine  100 mg Intravenous Daily   Continuous Infusions:   LOS: 5 days   Kerney Elbe, DO [facility] PAGER is on [place]  If 7PM-7AM, please contact night-coverage [email or web address]

## 2021-01-16 NOTE — TOC Progression Note (Signed)
Transition of Care [facility]) - Progression Note    Patient Details  Name: Mark [name] MRN: [id number] Date of Birth: May 12, 1965  Transition of Care [facility] CM/SW Contact  Purcell Mouton, RN Phone Number: 01/16/2021, 3:31 PM  Clinical Narrative:    Pt from home and plan to return home. Will need substance abuse information at discharge.    Expected Discharge Plan: Home/Self Care Barriers to Discharge: No Barriers Identified  Expected Discharge Plan and Services Expected Discharge Plan: Home/Self Care       Living arrangements for the past 2 months: Single Family Home                                       Social Determinants of Health (SDOH) Interventions    Readmission Risk Interventions Readmission Risk Prevention Plan 05/21/2019  Transportation Screening Complete  PCP or Specialist Appt within 3-5 Days Complete  HRI or [place] Complete  Social Work Consult for [place] Planning/Counseling Complete  Palliative Care Screening Not Applicable  Medication Review [employer] photographer) Complete  Some recent data might be hidden

## 2021-01-16 NOTE — Plan of Care (Signed)
  Problem: Education: Goal: Knowledge of General Education information will improve Description: Including pain rating scale, medication(s)/side effects and non-pharmacologic comfort measures Outcome: Progressing   Problem: Health Behavior/Discharge Planning: Goal: Ability to manage health-related needs will improve Outcome: Progressing   Problem: Clinical Measurements: Goal: Will remain free from infection Outcome: Progressing   

## 2021-01-17 LAB — BASIC METABOLIC PANEL
Anion gap: 5 (ref 5–15)
BUN: 9 mg/dL (ref 6–20)
CO2: 31 mmol/L (ref 22–32)
Calcium: 9.1 mg/dL (ref 8.9–10.3)
Chloride: 98 mmol/L (ref 98–111)
Creatinine, Ser: 0.64 mg/dL (ref 0.61–1.24)
GFR, Estimated: >60 mL/min (ref >60)
Glucose, Bld: 183 mg/dL — ABNORMAL HIGH (ref 70–99)
Potassium: 4 mmol/L (ref 3.5–5.1)
Sodium: 134 mmol/L — ABNORMAL LOW (ref 135–145)

## 2021-01-17 LAB — PHOSPHORUS: Phosphorus: 4 mg/dL (ref 2.5–4.6)

## 2021-01-17 LAB — MAGNESIUM: Magnesium: 2.1 mg/dL (ref 1.7–2.4)

## 2021-01-17 MED ORDER — FOLIC ACID 1 MG PO TABS: 1.0000 mg | ORAL_TABLET | Freq: Every day | ORAL | Status: DC

## 2021-01-17 MED ORDER — IBUPROFEN 200 MG PO TABS
200.0000 mg | ORAL_TABLET | Freq: Four times a day (QID) | ORAL | Status: DC | PRN
  Administered 2021-01-17: 200 mg via ORAL
  Filled 2021-01-17: qty 1

## 2021-01-17 MED ORDER — THIAMINE HCL 100 MG/ML IJ SOLN: 100.0000 mg | Freq: Every day | INTRAMUSCULAR | Status: DC

## 2021-01-17 MED ORDER — LORAZEPAM 2 MG/ML IJ SOLN: 1.0000 mg | INTRAMUSCULAR | Status: DC | PRN

## 2021-01-17 MED ORDER — THIAMINE HCL 100 MG PO TABS: 100.0000 mg | ORAL_TABLET | Freq: Every day | ORAL | Status: DC

## 2021-01-17 MED ORDER — LORAZEPAM 2 MG/ML IJ SOLN: 0.0000 mg | Freq: Three times a day (TID) | INTRAMUSCULAR | Status: DC

## 2021-01-17 MED ORDER — LORAZEPAM 1 MG PO TABS
1.0000 mg | ORAL_TABLET | ORAL | Status: DC | PRN
  Administered 2021-01-17 (×4): 2 mg via ORAL
  Administered 2021-01-18: 4 mg via ORAL
  Administered 2021-01-18: 2 mg via ORAL
  Administered 2021-01-18: 1 mg via ORAL
  Filled 2021-01-17: qty 2
  Filled 2021-01-17: qty 1
  Filled 2021-01-17 (×3): qty 2
  Filled 2021-01-17: qty 4
  Filled 2021-01-17: qty 2

## 2021-01-17 MED ORDER — LORAZEPAM 2 MG/ML IJ SOLN
0.0000 mg | INTRAMUSCULAR | Status: DC
  Administered 2021-01-18: 2 mg via INTRAVENOUS
  Filled 2021-01-17: qty 1
  Filled 2021-01-17: qty 2

## 2021-01-17 MED ORDER — ONDANSETRON HCL 4 MG/2ML IJ SOLN
4.0000 mg | Freq: Four times a day (QID) | INTRAMUSCULAR | Status: DC | PRN
  Administered 2021-01-17: 4 mg via INTRAVENOUS
  Filled 2021-01-17: qty 2

## 2021-01-17 MED ORDER — ADULT MULTIVITAMIN W/MINERALS CH: 1.0000 | ORAL_TABLET | Freq: Every day | ORAL | Status: DC

## 2021-01-17 NOTE — Progress Notes (Signed)
PROGRESS NOTE    Mark [name]  YWV:[id number] DOB: 01-Apr-1965 DOA: 01/10/2021 PCP: [name], MD   Chief Complaint  Patient presents with   Chest Pain    Brief Narrative: The patient is a 55 year old overweight Caucasian male with a past medical history significant for but not limited to polysubstance abuse including alcohol and cocaine abuse on Suboxone, hypertension, cardiomegaly, COPD, hyperlipidemia, GERD as well as anxiety and depression as well as other comorbidities who presents with a chief complaint of chest pain and wanting to detox from alcohol.  Patient is homeless and moved from place to place in yesterday's gathering his things when he noted focal nonradiating sharp pressure left-sided of his chest lasted for several minutes.  This resolved since being in the ED and he came in he states that he wants to detox from alcohol.  He states that he did drink 2 beers just to get him to the hospital and reports drinking about a case of beer daily.  Has attempted to detox in the past but reports that he has come to the ED for this previously denies any seizures or hallucinations but has been seeing floaters here and there and reports a possible seizure from alcohol withdrawal about 3 to 5 years ago.  States this is the worst he has felt trying to avoid alcohol.  He was admitted for his chest pain as well as alcohol abuse with concern for withdrawal. His Na+ has improved and resolved so will reduce IVF to NS at 50 mL/hr for 10 more hours yesterday but Na+ dropped again so will resume IVF with NS at 75 mL/hr.   Patient is currently actively withdrawing and is very anxious and tremulous still.  Nursing yesterday stated that his tremors were little bit better than the day before but he continues to withdraw.  His as needed Ativan timed out so was resumed. Patient is slowly making progress but because he continues to have elevated CIWA Scores. Will avoid Librium given "adverse" reaction to  it.    Assessment & Plan:   Principal Problem:   Alcohol withdrawal ([place]) Active Problems:   Essential hypertension   Hypokalemia   Polysubstance abuse ([name])   Hyponatremia   Chest pain   #1 heavy alcohol use/alcohol withdrawal -Patient noted to have his last beer the afternoon prior to admission. -Patient placed on Ativan withdrawal protocol, folic acid, multivitamin, thiamine. -Due to ongoing jitteriness patient was escalated to the stepdown unit withdrawal protocol set. -It is noted that patient noted to have had an adverse reaction to Librium and as such unable to be placed on Librium. -Improving clinically. -Placed back on scheduled Ativan via Ativan withdrawal protocol. -Hydroxyzine as needed. -Alcohol cessation stressed to patient.  2.  Hyponatremia -Likely secondary to beer [place] mania and heavy alcohol use -On admission patient noted to have a sodium of 120 with a sodium of 139 6 months prior to admission. -Patient placed on hydration with IV fluids with improvement with hyponatremia. -Follow.  3.  Hypokalemia -Repleted.  4.  Chest pain rule out ACS -On presentation patient had complaints of chest pain.  Cardiac enzymes which were drawn were flat. -EKG with no ischemic changes noted. -Patient noted to have symptoms resolved in the ED denied any further chest pain. -Patient noted with prior history of alcoholic cardiomyopathy with repeat 2D echo with a EF of 55 to 60%, NWMA -No further work-up needed inpatient.  5.  Elevated anion gap -In the setting of heavy alcohol  use. -Improved.  7.  Polysubstance abuse -Continue Suboxone. -UDS noted to be negative on admission.  8.  Hypertension -Continue metoprolol twice daily. -Hydralazine as needed.  9.  Hyperbilirubinemia -Trended down. -Improved. -Outpatient follow-up.  10.  Tobacco abuse -Tobacco cessation. -Nicotine patch.  11.  Groin lymphadenopathy -Likely reactive, noted on lower extremity Dopplers  which were negative. -Outpatient follow-up.  12.  Bilateral lower extremity pitting edema -Felt secondary in the setting of IV fluid resuscitation. -Albumin noted at 3.6. -Lower extremity Dopplers negative for DVT. -Patient given a dose of IV Lasix with clinical improvement. -Urine output not recorded.   DVT prophylaxis: Lovenox Code Status: Full Family Communication: Updated patient.  No family at bedside Disposition:   Status is: Inpatient  Remains inpatient appropriate because: Severity of illness       Consultants:  None  Procedures:  Chest x-ray 01/10/2021 2D echo 01/12/2021 Lower extremity Dopplers 01/16/2021  Antimicrobials:  None   Subjective: Patient with some complaints of some shallow breath sounds.  No chest pain.  No shortness of breath.  States feels withdrawal tremors coming on.  Tolerating current diet.  Objective: Vitals:   01/17/21 0934 01/17/21 1046 01/17/21 1113 01/17/21 1414  BP: 122/72 122/72 132/84 132/84  Pulse: 94 94 84 84  Resp:   20   Temp:   97.6 F (36.4 C)   TempSrc:   Axillary   SpO2:   98%   Weight:      Height:        Intake/Output Summary (Last 24 hours) at 01/17/2021 1501 Last data filed at 01/17/2021 0900 Gross per 24 hour  Intake 480 ml  Output --  Net 480 ml   Filed Weights   01/10/21 1924 01/11/21 1726 01/15/21 0500  Weight: 99.8 kg 99.8 kg 102.1 kg    Examination:  General exam: Appears calm and comfortable.  Minimal tremors noted. Respiratory system: Clear to auscultation. Respiratory effort normal. Cardiovascular system: S1 & S2 heard, RRR. No JVD, murmurs, rubs, gallops or clicks.  Trace bilateral lower extremity edema. Gastrointestinal system: Abdomen is nondistended, soft and nontender. No organomegaly or masses felt. Normal bowel sounds heard. Central nervous system: Alert and oriented. No focal neurological deficits. Extremities: Symmetric 5 x 5 power. Skin: No rashes, lesions or ulcers Psychiatry:  Judgement and insight appear normal. Mood & affect appropriate.     Data Reviewed: I have personally reviewed following labs and imaging studies  CBC: Recent Labs  Lab 01/12/21 0353 01/13/21 0344 01/14/21 0339 01/15/21 0357 01/16/21 0357  WBC 6.9 8.4 9.7 8.3 7.9  NEUTROABS 4.6 5.5 6.3 5.3 5.0  HGB 13.8 13.1 14.7 13.5 13.0  HCT 39.1 38.3* 42.9 39.2 37.7*  MCV 84.4 87.2 88.8 86.0 87.3  PLT 202 225 250 215 673    Basic Metabolic Panel: Recent Labs  Lab 01/13/21 0344 01/14/21 0339 01/15/21 0357 01/16/21 0357 01/17/21 1005  NA 133* 139 137 133* 134*  K 3.9 4.5 4.2 3.9 4.0  CL 100 103 100 100 98  CO2 27 30 27 25 31   GLUCOSE 125* 104* 103* 177* 183*  BUN 7 10 10 9 9   CREATININE 0.63 0.78 0.57* 0.53* 0.64  CALCIUM 8.6* 9.5 9.4 8.8* 9.1  MG 2.1 2.4 2.1 2.1 2.1  PHOS 3.9 4.2 4.8* 4.0 4.0    GFR: Estimated Creatinine Clearance: 128.1 mL/min (by C-G formula based on SCr of 0.64 mg/dL).  Liver Function Tests: Recent Labs  Lab 01/12/21 0353 01/13/21 0344 01/14/21 0339 01/15/21 0357 01/16/21  0357  AST 30 26 24 23 22   ALT 26 28 32 29 31  ALKPHOS 93 89 109 90 83  BILITOT 0.6 0.8 0.9 0.7 0.7  PROT 6.6 6.6 7.2 6.8 6.3*  ALBUMIN 3.9 4.0 4.3 3.9 3.6    CBG: No results for input(s): GLUCAP in the last 168 hours.   Recent Results (from the past 240 hour(s))  Resp Panel by RT-PCR (Flu A&B, Covid) Nasopharyngeal Swab     Status: None   Collection Time: 01/10/21 11:30 PM   Specimen: Nasopharyngeal Swab; Nasopharyngeal(NP) swabs in vial transport medium  Result Value Ref Range Status   SARS Coronavirus 2 by RT PCR NEGATIVE NEGATIVE Final    Comment: (NOTE) SARS-CoV-2 target nucleic acids are NOT DETECTED.  The SARS-CoV-2 RNA is generally detectable in upper respiratory specimens during the acute phase of infection. The lowest concentration of SARS-CoV-2 viral copies this assay can detect is 138 copies/mL. A negative result does not preclude SARS-Cov-2 infection and  should not be used as the sole basis for treatment or other patient management decisions. A negative result may occur with  improper specimen collection/handling, submission of specimen other than nasopharyngeal swab, presence of viral mutation(s) within the areas targeted by this assay, and inadequate number of viral copies(<138 copies/mL). A negative result must be combined with clinical observations, patient history, and epidemiological information. The expected result is Negative.  Fact Sheet for Patients:  EntrepreneurPulse.com.au  Fact Sheet for Healthcare Providers:  IncredibleEmployment.be  This test is no t yet approved or cleared by the [place] FDA and  has been authorized for detection and/or diagnosis of SARS-CoV-2 by FDA under an Emergency Use Authorization (EUA). This EUA will remain  in effect (meaning this test can be used) for the duration of the COVID-19 declaration under Section 564(b)(1) of the Act, 21 U.S.C.section 360bbb-3(b)(1), unless the authorization is terminated  or revoked sooner.       Influenza A by PCR NEGATIVE NEGATIVE Final   Influenza B by PCR NEGATIVE NEGATIVE Final    Comment: (NOTE) The Xpert Xpress SARS-CoV-2/FLU/RSV plus assay is intended as an aid in the diagnosis of influenza from Nasopharyngeal swab specimens and should not be used as a sole basis for treatment. Nasal washings and aspirates are unacceptable for Xpert Xpress SARS-CoV-2/FLU/RSV testing.  Fact Sheet for Patients: EntrepreneurPulse.com.au  Fact Sheet for Healthcare Providers: IncredibleEmployment.be  This test is not yet approved or cleared by the [place] FDA and has been authorized for detection and/or diagnosis of SARS-CoV-2 by FDA under an Emergency Use Authorization (EUA). This EUA will remain in effect (meaning this test can be used) for the duration of the COVID-19 declaration  under Section 564(b)(1) of the Act, 21 U.S.C. section 360bbb-3(b)(1), unless the authorization is terminated or revoked.  Performed at [facility], [place] [address], [place], [place] [address]          Radiology Studies: VAS [place] LOWER EXTREMITY VENOUS (DVT)  Result Date: 01/16/2021  Lower Venous DVT Study Patient Name:  Mark [name]  Date of Exam:   01/16/2021 Medical Rec #: [id number]            Accession #:    [id number] Date of Birth: 11-04-1965            Patient Gender: M Patient Age:   25 years Exam Location:  [facility] Procedure:      VAS [place] LOWER EXTREMITY VENOUS (DVT) Referring Phys: Raiford Noble --------------------------------------------------------------------------------  Indications:  Swelling.  Comparison Study: Previous exam on 12/10/2011 was negative for DVT. Performing Technologist: Rogelia Rohrer RVT, RDMS  Examination Guidelines: A complete evaluation includes B-mode imaging, spectral Doppler, color Doppler, and power Doppler as needed of all accessible portions of each vessel. Bilateral testing is considered an integral part of a complete examination. Limited examinations for reoccurring indications may be performed as noted. The reflux portion of the exam is performed with the patient in reverse Trendelenburg.  +---------+---------------+---------+-----------+----------+-------------------+ RIGHT    CompressibilityPhasicitySpontaneityPropertiesThrombus Aging      +---------+---------------+---------+-----------+----------+-------------------+ CFV      Full           Yes      Yes                                      +---------+---------------+---------+-----------+----------+-------------------+ SFJ      Full                                                             +---------+---------------+---------+-----------+----------+-------------------+ FV Prox  Full           Yes      Yes                                       +---------+---------------+---------+-----------+----------+-------------------+ FV Mid   Full           Yes      Yes                                      +---------+---------------+---------+-----------+----------+-------------------+ FV DistalFull           Yes      Yes                                      +---------+---------------+---------+-----------+----------+-------------------+ PFV      Full                                                             +---------+---------------+---------+-----------+----------+-------------------+ POP      Full           Yes      Yes                                      +---------+---------------+---------+-----------+----------+-------------------+ PTV      Full                                                             +---------+---------------+---------+-----------+----------+-------------------+ PERO  Full                                         Not well visualized +---------+---------------+---------+-----------+----------+-------------------+   +---------+---------------+---------+-----------+----------+-------------------+ LEFT     CompressibilityPhasicitySpontaneityPropertiesThrombus Aging      +---------+---------------+---------+-----------+----------+-------------------+ CFV      Full           Yes      Yes                                      +---------+---------------+---------+-----------+----------+-------------------+ SFJ      Full                                                             +---------+---------------+---------+-----------+----------+-------------------+ FV Prox  Full           Yes      Yes                                      +---------+---------------+---------+-----------+----------+-------------------+ FV Mid   Full           Yes      Yes                                      +---------+---------------+---------+-----------+----------+-------------------+ FV  DistalFull           Yes      Yes                                      +---------+---------------+---------+-----------+----------+-------------------+ PFV      Full                                                             +---------+---------------+---------+-----------+----------+-------------------+ POP      Full           Yes      Yes                                      +---------+---------------+---------+-----------+----------+-------------------+ PTV      Full                                                             +---------+---------------+---------+-----------+----------+-------------------+ PERO     Full  Not well visualized +---------+---------------+---------+-----------+----------+-------------------+     Summary: BILATERAL: - No evidence of deep vein thrombosis seen in the lower extremities, bilaterally. - No evidence of superficial venous thrombosis in the lower extremities, bilaterally. -No evidence of popliteal cyst, bilaterally. RIGHT: - Ultrasound characteristics of enlarged lymph nodes are noted in the groin.  LEFT: - Ultrasound characteristics of enlarged lymph nodes noted in the groin.  *See table(s) above for measurements and observations. Electronically signed by Deitra Mayo MD on 01/16/2021 at 1:44:32 PM.    Final         Scheduled Meds:  buprenorphine-naloxone  1 tablet Sublingual TID   enoxaparin (LOVENOX) injection  40 mg Subcutaneous J17H   folic acid  1 mg Oral Daily   gabapentin  200 mg Oral BID   LORazepam  0-4 mg Intravenous Q4H   Followed by   Derrill Memo ON 01/19/2021] LORazepam  0-4 mg Intravenous Q8H   metoprolol tartrate  25 mg Oral BID   multivitamin with minerals  1 tablet Oral Daily   nicotine  21 mg Transdermal Daily   polyethylene glycol  17 g Oral BID   potassium chloride  40 mEq Oral BID   senna-docusate  1 tablet Oral BID   tamsulosin  0.4 mg Oral QPC supper    thiamine  100 mg Oral Daily   Or   thiamine  100 mg Intravenous Daily   Continuous Infusions:   LOS: 6 days    Time spent: 35 minutes    Irine Seal, MD [facility]   To contact the attending provider between 7A-7P or the covering provider during after hours 7P-7A, please log into the web site [email or web address] and access using universal [place] password for that web site. If you do not have the password, please call the hospital operator.  01/17/2021, 3:01 PM

## 2021-01-17 NOTE — TOC Progression Note (Signed)
Transition of Care [facility]) - Progression Note    Patient Details  Name: [name] MRN: [id number] Date of Birth: 1966-01-28  Transition of [facility]) CM/SW Contact  Purcell Mouton, RN Phone Number: 01/17/2021, 1:05 PM  Clinical Narrative:    Spoke with pt concerning disposition at discharge and his PCP. Pt states that he lives with his mother, PCP is Dr. [name] with [facility]. Substance Abuse list given to pt. Pt states that he has called agencies on the list before. Encouraged pt to keep calling.    Expected Discharge Plan: Home/Self Care Barriers to Discharge: No Barriers Identified  Expected Discharge Plan and Services Expected Discharge Plan: Home/Self Care       Living arrangements for the past 2 months: Single Family Home                                       Social Determinants of Health (SDOH) Interventions    Readmission Risk Interventions Readmission Risk Prevention Plan 05/21/2019  Transportation Screening Complete  PCP or Specialist Appt within 3-5 Days Complete  HRI or [place] Complete  Social Work Consult for [place] Planning/Counseling Complete  Palliative Care Screening Not Applicable  Medication Review [employer]) Complete  Some recent data might be hidden

## 2021-01-17 NOTE — Plan of Care (Signed)
  Problem: Education: Goal: Knowledge of General Education information will improve Description: Including pain rating scale, medication(s)/side effects and non-pharmacologic comfort measures Outcome: Adequate for Discharge   Problem: Activity: Goal: Risk for activity intolerance will decrease Outcome: Adequate for Discharge   Problem: Nutrition: Goal: Adequate nutrition will be maintained Outcome: Adequate for Discharge   Problem: Coping: Goal: Level of anxiety will decrease Outcome: Adequate for Discharge

## 2021-01-18 ENCOUNTER — Other Ambulatory Visit (HOSPITAL_COMMUNITY): Payer: Self-pay

## 2021-01-18 LAB — RENAL FUNCTION PANEL
Albumin: 4 g/dL (ref 3.5–5.0)
Anion gap: 8 (ref 5–15)
BUN: 7 mg/dL (ref 6–20)
CO2: 30 mmol/L (ref 22–32)
Calcium: 9.3 mg/dL (ref 8.9–10.3)
Chloride: 98 mmol/L (ref 98–111)
Creatinine, Ser: 0.67 mg/dL (ref 0.61–1.24)
GFR, Estimated: >60 mL/min (ref >60)
Glucose, Bld: 242 mg/dL — ABNORMAL HIGH (ref 70–99)
Phosphorus: 4 mg/dL (ref 2.5–4.6)
Potassium: 4.3 mmol/L (ref 3.5–5.1)
Sodium: 136 mmol/L (ref 135–145)

## 2021-01-18 LAB — MAGNESIUM: Magnesium: 2.2 mg/dL (ref 1.7–2.4)

## 2021-01-18 MED ORDER — THIAMINE HCL 100 MG PO TABS: 100.0000 mg | ORAL_TABLET | Freq: Every day | ORAL | Status: DC

## 2021-01-18 MED ORDER — TAMSULOSIN HCL 0.4 MG PO CAPS
0.4000 mg | ORAL_CAPSULE | Freq: Every day | ORAL | 1 refills | Status: DC
  Filled 2021-01-18: qty 30, 30d supply, fill #0

## 2021-01-18 MED ORDER — ADULT MULTIVITAMIN W/MINERALS CH: 1.0000 | ORAL_TABLET | Freq: Every day | ORAL | Status: DC

## 2021-01-18 MED ORDER — HYDROXYZINE HCL 25 MG PO TABS
25.0000 mg | ORAL_TABLET | Freq: Three times a day (TID) | ORAL | 0 refills | Status: DC | PRN
  Filled 2021-01-18: qty 20, 7d supply, fill #0

## 2021-01-18 MED ORDER — FOLIC ACID 1 MG PO TABS: 1.0000 mg | ORAL_TABLET | Freq: Every day | ORAL | Status: DC

## 2021-01-18 MED ORDER — SENNOSIDES-DOCUSATE SODIUM 8.6-50 MG PO TABS: 1.0000 | ORAL_TABLET | Freq: Two times a day (BID) | ORAL | Status: DC

## 2021-01-18 MED ORDER — ALBUTEROL SULFATE HFA 108 (90 BASE) MCG/ACT IN AERS
1.0000 | INHALATION_SPRAY | Freq: Four times a day (QID) | RESPIRATORY_TRACT | 1 refills | Status: DC | PRN
  Filled 2021-01-18: qty 8.5, 25d supply, fill #0

## 2021-01-18 MED ORDER — NICOTINE 21 MG/24HR TD PT24
21.0000 mg | MEDICATED_PATCH | Freq: Every day | TRANSDERMAL | 0 refills | Status: DC
  Filled 2021-01-18: qty 28, 28d supply, fill #0

## 2021-01-18 NOTE — Discharge Instructions (Signed)
Reviewed with patient by RN.

## 2021-01-18 NOTE — Discharge Summary (Signed)
Physician Discharge Summary  Mark [name] VFI:[id number] DOB: 1966/01/16 DOA: 01/10/2021  PCP: [name], MD  Admit date: 01/10/2021 Discharge date: 01/18/2021  Time spent: 60 minutes  Recommendations for Outpatient Follow-up:  Follow-up with [name], MD in 2 weeks.  On follow-up patient will need a basic metabolic profile done to follow-up on electrolytes and renal function.  Patient also need a magnesium level checked.  Patient's blood pressure need to be reassessed on follow-up.   Discharge Diagnoses:  Principal Problem:   Alcohol withdrawal ([place]) Active Problems:   Essential hypertension   Hypokalemia   Polysubstance abuse ([place])   Hyponatremia   Chest pain   Discharge Condition: Stable and improved  Diet recommendation: Heart healthy  Filed Weights   01/10/21 1924 01/11/21 1726 01/15/21 0500  Weight: 99.8 kg 99.8 kg 102.1 kg    History of present illness:  HPI per Dr. Elmon Else Viet Kemmerer is a 55 y.o. male with medical history significant for polysubstance abuse (ETOH, cocaine) on Suboxone, hypertension, cardiomegaly, COPD, hyperlipidemia, general anxiety and depression who presents with concerns of chest pain and wanting to detox from alcohol.   Patient is homeless and moves from place to place.  Today was gathering his things when he noted focal nonradiating sharp and pressure-like left-sided chest pain lasting for several minutes.  This has resolved since being in ED.  He also wants to detox from alcohol.  States last drink was this afternoon about 2 beers.  Reports drinking about 4-5 tall beers daily on and off for several years.  Has attempted detox in the past but reports he only comes to the ED for this.  Repeat denies any hallucinations but has been seeing floaters here and there.  Reports possible seizure from alcohol withdrawal 3 to 5 years ago.   ED Course: He was afebrile, hypertensive up to systolic of 416S over 063.  CBC unremarkable.  Sodium  of 120, K of 3.3, chloride of 80, CO2 23, creatinine of 0.43, anion gap of 17. Troponin reassuring at 4.  Chest x-ray negative.  EKG with normal sinus rhythm and no change from prior.  Hospital Course:  1 heavy alcohol use/alcohol withdrawal -Patient noted to have his last beer the afternoon prior to admission. -Patient placed on Ativan withdrawal protocol, folic acid, multivitamin, thiamine. -Due to ongoing jitteriness patient was escalated to the stepdown unit withdrawal protocol set. -It is noted that patient noted to have had an adverse reaction to Librium and as such unable to be placed on Librium. -Patient improved clinically on Ativan withdrawal protocol as well as hydroxyzine as needed. -Alcohol cessation stressed to patient. -Patient seen by [facility] and patient provided information on services for alcohol withdrawal. -Outpatient follow-up with PCP.  2.  Hyponatremia -Likely secondary to beer Potomania and heavy alcohol use -On admission patient noted to have a sodium of 120 with a sodium of 139 6 months prior to admission. -Patient placed on hydration with IV fluids with improvement with hyponatremia such that by day of discharge sodium was up to 136 and hyponatremia had resolved. -Outpatient follow-up with PCP.  3.  Hypokalemia -Repleted.  4.  Chest pain rule out ACS -On presentation patient had complaints of chest pain.  Cardiac enzymes which were drawn were flat. -EKG with no ischemic changes noted. -Patient noted to have symptoms resolved in the ED denied any further chest pain. -Patient noted with prior history of alcoholic cardiomyopathy with repeat 2D echo with a EF of 55 to 60%,  NWMA -No further work-up needed inpatient.  5.  Elevated anion gap -In the setting of heavy alcohol use. -Improved.  7.  Polysubstance abuse -Patient maintained on home regimen Suboxone. -UDS noted to be negative on admission.  8.  Hypertension -Patient maintained on metoprolol twice daily  as well as hydralazine as needed.   -Outpatient follow-up with PCP.    9.  Hyperbilirubinemia -Trended down. -Improved. -Outpatient follow-up.  10.  Tobacco abuse -Tobacco cessation. -Patient placed on a nicotine patch.  11.  Groin lymphadenopathy -Likely reactive, noted on lower extremity Dopplers which were negative. -Outpatient follow-up.  12.  Bilateral lower extremity pitting edema -Felt secondary in the setting of IV fluid resuscitation. -Albumin noted at 3.6. -Lower extremity Dopplers negative for DVT. -Patient given a dose of IV Lasix with clinical improvement. -Outpatient follow-up with PCP for    Procedures: Chest x-ray 01/10/2021 2D echo 01/12/2021 Lower extremity Dopplers 01/16/2021    Consultations: None  Discharge Exam: Vitals:   01/18/21 0600 01/18/21 1153  BP: 115/60 129/86  Pulse: 75 87  Resp: 19 18  Temp: (!) 97.5 F (36.4 C) 97.7 F (36.5 C)  SpO2: 96% 97%    General: NAD Cardiovascular: RRR no murmurs rubs or gallops.  No JVD.  No lower extremity edema. Respiratory: Clear to auscultation bilaterally.  No wheezes, no crackles, no rhonchi  Discharge Instructions   Discharge Instructions     Diet - low sodium heart healthy   Complete by: As directed    Discharge instructions   Complete by: As directed    STOP DRINKING ALCOHOL   Increase activity slowly   Complete by: As directed       Allergies as of 01/18/2021       Reactions   Cozaar [losartan] Anaphylaxis   Librium [chlordiazepoxide] Nausea Only, Other (See Comments)   "Tears up my guts" (SEVERE NAUSEA- COMES CLOSE TO VOMITING- SEVERE DRY HEAVES)   Lisinopril Anaphylaxis   Other reaction(s): Angioedema (ALLERGY/intolerance)   Tylenol [acetaminophen] Other (See Comments)   CANNOT TAKE DUE TO LIVER ISSUE(S)        Medication List     STOP taking these medications    hydrochlorothiazide 25 MG tablet Commonly known as: HYDRODIURIL   Lidocaine 4 % Ptch Commonly known  as: HM Lidocaine Patch   rosuvastatin 20 MG tablet Commonly known as: Crestor       TAKE these medications    albuterol 108 (90 Base) MCG/ACT inhaler Commonly known as: VENTOLIN HFA Inhale 1-2 puffs into the lungs every 6 (six) hours as needed for wheezing or shortness of breath.   buprenorphine-naloxone 8-2 mg Subl SL tablet Commonly known as: SUBOXONE DISSOLVE 1 TABLET UNDER THE TONGUE IN THE MORNING, AT NOON, AND AT BEDTIME What changed:  how much to take how to take this when to take this   diclofenac Sodium 1 % Gel Commonly known as: VOLTAREN APPLY 4 GRAMS TO AFFECTED AREA 4 TIMES DAILY What changed:  how much to take how to take this when to take this reasons to take this   dicyclomine 10 MG capsule Commonly known as: BENTYL TAKE 2 CAPSULES BY MOUTH EVERY 8 HOURS AS NEEDED FOR SPASMS What changed:  how much to take how to take this when to take this reasons to take this   folic acid 1 MG tablet Commonly known as: FOLVITE Take 1 tablet (1 mg total) by mouth daily. Start taking on: January 19, 2021   gabapentin 100 MG capsule Commonly  known as: Neurontin Take 2 capsules by mouth in the morning and take 2 capsules daily with lunch and 1 capsule  every evening. What changed: See the new instructions.   hydrOXYzine 25 MG tablet Commonly known as: ATARAX/VISTARIL Take 1 tablet (25 mg total) by mouth 3 (three) times daily as needed for itching, anxiety, nausea or vomiting.   metoprolol tartrate 25 MG tablet Commonly known as: LOPRESSOR TAKE 1 TABLET BY MOUTH 2 TIMES DAILY What changed: how much to take   multivitamin with minerals Tabs tablet Take 1 tablet by mouth daily. Start taking on: January 19, 2021   nicotine 21 mg/24hr patch Commonly known as: NICODERM CQ - dosed in mg/24 hours Place 1 patch (21 mg total) onto the skin daily. Start taking on: January 19, 2021   senna-docusate 8.6-50 MG tablet Commonly known as: Senokot-S Take 1 tablet by  mouth 2 (two) times daily.   tamsulosin 0.4 MG Caps capsule Commonly known as: FLOMAX Take 1 capsule  by mouth daily.   thiamine 100 MG tablet Take 1 tablet (100 mg total) by mouth daily. Start taking on: January 19, 2021       Allergies  Allergen Reactions   Cozaar [Losartan] Anaphylaxis   Librium [Chlordiazepoxide] Nausea Only and Other (See Comments)    "Tears up my guts" (SEVERE NAUSEA- COMES CLOSE TO VOMITING- SEVERE DRY HEAVES)   Lisinopril Anaphylaxis    Other reaction(s): Angioedema (ALLERGY/intolerance)   Tylenol [Acetaminophen] Other (See Comments)    CANNOT TAKE DUE TO LIVER ISSUE(S)    Follow-up Information     [name], MD. Schedule an appointment as soon as possible for a visit in 2 week(s).   Specialty: Internal Medicine Contact information: [address] [place] [address] [phone]                  The results of significant diagnostics from this hospitalization (including imaging, microbiology, ancillary and laboratory) are listed below for reference.    Significant Diagnostic Studies: DG Chest Port 1 View  Result Date: 01/10/2021 CLINICAL DATA:  Chest pain. EXAM: PORTABLE CHEST 1 VIEW COMPARISON:  PA and lateral chest 12/14/2020 FINDINGS: The heart size and mediastinal contours are within normal limits. Both lungs are clear. The visualized skeletal structures are unremarkable apart from old fusion plating in the lower cervical spine and thoracic spondylosis and degenerative disc disease. IMPRESSION: No evidence of acute chest disease or interval changes. Electronically Signed   By: Telford Nab M.D.   On: 01/10/2021 22:13   ECHOCARDIOGRAM COMPLETE  Result Date: 01/12/2021    ECHOCARDIOGRAM REPORT   Patient Name:   Mark [name] Date of Exam: 01/12/2021 Medical Rec #:  [id number]           Height:       76.0 in Accession #:    [id number]          Weight:       220.1 lb Date of Birth:  November 13, 1965           BSA:           2.307 m Patient Age:    12 years            BP:           140/93 mmHg Patient Gender: M                   HR:  94 bpm. Exam Location:  Inpatient Procedure: 2D Echo, Color Doppler and Cardiac Doppler Indications:    R07.9* Chest pain, unspecified  History:        Patient has prior history of Echocardiogram examinations, most                 recent 02/12/2019. COPD; Risk Factors:Hypertension, Dyslipidemia                 and PSA.  Sonographer:    Raquel Sarna Senior RDCS Referring Phys: [id number] [place] [facility]  Sonographer Comments: Poor parasternal window due to COPD IMPRESSIONS  1. Left ventricular ejection fraction, by estimation, is 55 to 60%. The left ventricle has normal function. The left ventricle has no regional wall motion abnormalities. Indeterminate diastolic filling due to E-A fusion.  2. Right ventricular systolic function is normal. The right ventricular size is normal. Tricuspid regurgitation signal is inadequate for assessing PA pressure.  3. The mitral valve is grossly normal. No evidence of mitral valve regurgitation. No evidence of mitral stenosis.  4. The aortic valve was not well visualized. Aortic valve regurgitation is not visualized. No aortic stenosis is present.  5. The inferior vena cava is normal in size with greater than 50% respiratory variability, suggesting right atrial pressure of 3 mmHg. Comparison(s): No significant change from prior study. Conclusion(s)/Recommendation(s): Normal biventricular function without evidence of hemodynamically significant valvular heart disease. FINDINGS  Left Ventricle: Left ventricular ejection fraction, by estimation, is 55 to 60%. The left ventricle has normal function. The left ventricle has no regional wall motion abnormalities. The left ventricular internal cavity size was normal in size. There is  no left ventricular hypertrophy. Indeterminate diastolic filling due to E-A fusion. Right Ventricle: The right ventricular size is normal. No increase in  right ventricular wall thickness. Right ventricular systolic function is normal. Tricuspid regurgitation signal is inadequate for assessing PA pressure. Left Atrium: Left atrial size was normal in size. Right Atrium: Right atrial size was normal in size. Pericardium: There is no evidence of pericardial effusion. Mitral Valve: The mitral valve is grossly normal. No evidence of mitral valve regurgitation. No evidence of mitral valve stenosis. Tricuspid Valve: The tricuspid valve is grossly normal. Tricuspid valve regurgitation is not demonstrated. No evidence of tricuspid stenosis. Aortic Valve: The aortic valve was not well visualized. Aortic valve regurgitation is not visualized. No aortic stenosis is present. Pulmonic Valve: The pulmonic valve was not well visualized. Pulmonic valve regurgitation is not visualized. Aorta: The aortic root is normal in size and structure. Venous: The inferior vena cava is normal in size with greater than 50% respiratory variability, suggesting right atrial pressure of 3 mmHg. IAS/Shunts: The atrial septum is grossly normal.  LEFT VENTRICLE PLAX 2D LVIDd:         5.20 cm LVIDs:         4.00 cm LV PW:         1.20 cm LV IVS:        1.00 cm LVOT diam:     2.10 cm LV SV:         64 LV SV Index:   28 LVOT Area:     3.46 cm  RIGHT VENTRICLE RV S prime:     10.80 cm/s TAPSE (M-mode): 2.3 cm LEFT ATRIUM             Index        RIGHT ATRIUM           Index LA diam:  4.10 cm 1.78 cm/m   RA Area:     18.60 cm LA Vol (A2C):   64.3 ml 27.87 ml/m  RA Volume:   49.20 ml  21.33 ml/m LA Vol (A4C):   51.3 ml 22.24 ml/m LA Biplane Vol: 59.2 ml 25.66 ml/m  AORTIC VALVE LVOT Vmax:   96.00 cm/s LVOT Vmean:  74.100 cm/s LVOT VTI:    0.185 m  AORTA Ao Root diam: 3.30 cm  SHUNTS Systemic VTI:  0.18 m Systemic Diam: 2.10 cm Eleonore Chiquito MD Electronically signed by Eleonore Chiquito MD Signature Date/Time: 01/12/2021/10:12:19 AM    Final    VAS [place] LOWER EXTREMITY VENOUS (DVT)  Result Date:  01/16/2021  Lower Venous DVT Study Patient Name:  Mark [name]  Date of Exam:   01/16/2021 Medical Rec #: [id number]            Accession #:    [id number] Date of Birth: [birth year]            Patient Gender: M Patient Age:   89 years Exam Location:  [facility] Procedure:      VAS [place] LOWER EXTREMITY VENOUS (DVT) Referring Phys: Raiford Noble --------------------------------------------------------------------------------  Indications: Swelling.  Comparison Study: Previous exam on 12/10/2011 was negative for DVT. Performing Technologist: Rogelia Rohrer RVT, RDMS  Examination Guidelines: A complete evaluation includes B-mode imaging, spectral Doppler, color Doppler, and power Doppler as needed of all accessible portions of each vessel. Bilateral testing is considered an integral part of a complete examination. Limited examinations for reoccurring indications may be performed as noted. The reflux portion of the exam is performed with the patient in reverse Trendelenburg.  +---------+---------------+---------+-----------+----------+-------------------+ RIGHT    CompressibilityPhasicitySpontaneityPropertiesThrombus Aging      +---------+---------------+---------+-----------+----------+-------------------+ CFV      Full           Yes      Yes                                      +---------+---------------+---------+-----------+----------+-------------------+ SFJ      Full                                                             +---------+---------------+---------+-----------+----------+-------------------+ FV Prox  Full           Yes      Yes                                      +---------+---------------+---------+-----------+----------+-------------------+ FV Mid   Full           Yes      Yes                                      +---------+---------------+---------+-----------+----------+-------------------+ FV DistalFull           Yes      Yes                                       +---------+---------------+---------+-----------+----------+-------------------+  PFV      Full                                                             +---------+---------------+---------+-----------+----------+-------------------+ POP      Full           Yes      Yes                                      +---------+---------------+---------+-----------+----------+-------------------+ PTV      Full                                                             +---------+---------------+---------+-----------+----------+-------------------+ PERO     Full                                         Not well visualized +---------+---------------+---------+-----------+----------+-------------------+   +---------+---------------+---------+-----------+----------+-------------------+ LEFT     CompressibilityPhasicitySpontaneityPropertiesThrombus Aging      +---------+---------------+---------+-----------+----------+-------------------+ CFV      Full           Yes      Yes                                      +---------+---------------+---------+-----------+----------+-------------------+ SFJ      Full                                                             +---------+---------------+---------+-----------+----------+-------------------+ FV Prox  Full           Yes      Yes                                      +---------+---------------+---------+-----------+----------+-------------------+ FV Mid   Full           Yes      Yes                                      +---------+---------------+---------+-----------+----------+-------------------+ FV DistalFull           Yes      Yes                                      +---------+---------------+---------+-----------+----------+-------------------+ PFV      Full                                                              +---------+---------------+---------+-----------+----------+-------------------+  POP      Full           Yes      Yes                                      +---------+---------------+---------+-----------+----------+-------------------+ PTV      Full                                                             +---------+---------------+---------+-----------+----------+-------------------+ PERO     Full                                         Not well visualized +---------+---------------+---------+-----------+----------+-------------------+     Summary: BILATERAL: - No evidence of deep vein thrombosis seen in the lower extremities, bilaterally. - No evidence of superficial venous thrombosis in the lower extremities, bilaterally. -No evidence of popliteal cyst, bilaterally. RIGHT: - Ultrasound characteristics of enlarged lymph nodes are noted in the groin.  LEFT: - Ultrasound characteristics of enlarged lymph nodes noted in the groin.  *See table(s) above for measurements and observations. Electronically signed by Deitra Mayo MD on 01/16/2021 at 1:44:32 PM.    Final     Microbiology: Recent Results (from the past 240 hour(s))  Resp Panel by RT-PCR (Flu A&B, Covid) Nasopharyngeal Swab     Status: None   Collection Time: 01/10/21 11:30 PM   Specimen: Nasopharyngeal Swab; Nasopharyngeal(NP) swabs in vial transport medium  Result Value Ref Range Status   SARS Coronavirus 2 by RT PCR NEGATIVE NEGATIVE Final    Comment: (NOTE) SARS-CoV-2 target nucleic acids are NOT DETECTED.  The SARS-CoV-2 RNA is generally detectable in upper respiratory specimens during the acute phase of infection. The lowest concentration of SARS-CoV-2 viral copies this assay can detect is 138 copies/mL. A negative result does not preclude SARS-Cov-2 infection and should not be used as the sole basis for treatment or other patient management decisions. A negative result may occur with  improper specimen  collection/handling, submission of specimen other than nasopharyngeal swab, presence of viral mutation(s) within the areas targeted by this assay, and inadequate number of viral copies(<138 copies/mL). A negative result must be combined with clinical observations, patient history, and epidemiological information. The expected result is Negative.  Fact Sheet for Patients:  EntrepreneurPulse.com.au  Fact Sheet for Healthcare Providers:  IncredibleEmployment.be  This test is no t yet approved or cleared by the [place] FDA and  has been authorized for detection and/or diagnosis of SARS-CoV-2 by FDA under an Emergency Use Authorization (EUA). This EUA will remain  in effect (meaning this test can be used) for the duration of the COVID-19 declaration under Section 564(b)(1) of the Act, 21 U.S.C.section 360bbb-3(b)(1), unless the authorization is terminated  or revoked sooner.       Influenza A by PCR NEGATIVE NEGATIVE Final   Influenza B by PCR NEGATIVE NEGATIVE Final    Comment: (NOTE) The Xpert Xpress SARS-CoV-2/FLU/RSV plus assay is intended as an aid in the diagnosis of influenza from Nasopharyngeal swab specimens and should not be used as a sole basis for treatment. Nasal washings and aspirates  are unacceptable for Xpert Xpress SARS-CoV-2/FLU/RSV testing.  Fact Sheet for Patients: EntrepreneurPulse.com.au  Fact Sheet for Healthcare Providers: IncredibleEmployment.be  This test is not yet approved or cleared by the [place] FDA and has been authorized for detection and/or diagnosis of SARS-CoV-2 by FDA under an Emergency Use Authorization (EUA). This EUA will remain in effect (meaning this test can be used) for the duration of the COVID-19 declaration under Section 564(b)(1) of the Act, 21 U.S.C. section 360bbb-3(b)(1), unless the authorization is terminated or revoked.  Performed at [facility], [place] [address], [place], [place] [address]      Labs: Basic Metabolic Panel: Recent Labs  Lab 01/14/21 0339 01/15/21 0357 01/16/21 0357 01/17/21 1005 01/18/21 0335  NA 139 137 133* 134* 136  K 4.5 4.2 3.9 4.0 4.3  CL 103 100 100 98 98  CO2 30 27 25 31 30   GLUCOSE 104* 103* 177* 183* 242*  BUN 10 10 9 9 7   CREATININE 0.78 0.57* 0.53* 0.64 0.67  CALCIUM 9.5 9.4 8.8* 9.1 9.3  MG 2.4 2.1 2.1 2.1 2.2  PHOS 4.2 4.8* 4.0 4.0 4.0   Liver Function Tests: Recent Labs  Lab 01/12/21 0353 01/13/21 0344 01/14/21 0339 01/15/21 0357 01/16/21 0357 01/18/21 0335  AST 30 26 24 23 22   --   ALT 26 28 32 29 31  --   ALKPHOS 93 89 109 90 83  --   BILITOT 0.6 0.8 0.9 0.7 0.7  --   PROT 6.6 6.6 7.2 6.8 6.3*  --   ALBUMIN 3.9 4.0 4.3 3.9 3.6 4.0   No results for input(s): LIPASE, AMYLASE in the last 168 hours. No results for input(s): AMMONIA in the last 168 hours. CBC: Recent Labs  Lab 01/12/21 0353 01/13/21 0344 01/14/21 0339 01/15/21 0357 01/16/21 0357  WBC 6.9 8.4 9.7 8.3 7.9  NEUTROABS 4.6 5.5 6.3 5.3 5.0  HGB 13.8 13.1 14.7 13.5 13.0  HCT 39.1 38.3* 42.9 39.2 37.7*  MCV 84.4 87.2 88.8 86.0 87.3  PLT 202 225 250 215 202   Cardiac Enzymes: No results for input(s): CKTOTAL, CKMB, CKMBINDEX, TROPONINI in the last 168 hours. BNP: BNP (last 3 results) No results for input(s): BNP in the last 8760 hours.  ProBNP (last 3 results) No results for input(s): PROBNP in the last 8760 hours.  CBG: No results for input(s): GLUCAP in the last 168 hours.     Signed:  Irine Seal MD.  [facility] 01/18/2021, 3:37 PM

## 2021-01-18 NOTE — Plan of Care (Signed)

## 2021-01-23 ENCOUNTER — Other Ambulatory Visit: Payer: Self-pay | Admitting: Internal Medicine

## 2021-01-23 ENCOUNTER — Other Ambulatory Visit (HOSPITAL_COMMUNITY): Payer: Self-pay

## 2021-01-23 DIAGNOSIS — M792 Neuralgia and neuritis, unspecified: Secondary | ICD-10-CM

## 2021-01-24 ENCOUNTER — Other Ambulatory Visit (HOSPITAL_COMMUNITY): Payer: Self-pay

## 2021-01-24 MED ORDER — GABAPENTIN 100 MG PO CAPS
ORAL_CAPSULE | ORAL | 2 refills | Status: DC
  Filled 2021-01-24 – 2021-02-14 (×2): qty 150, 30d supply, fill #0
  Filled 2021-03-19: qty 150, 30d supply, fill #1
  Filled 2021-04-18: qty 150, 30d supply, fill #2

## 2021-02-02 ENCOUNTER — Other Ambulatory Visit (HOSPITAL_COMMUNITY): Payer: Self-pay

## 2021-02-06 ENCOUNTER — Other Ambulatory Visit (HOSPITAL_COMMUNITY): Payer: Self-pay

## 2021-02-06 ENCOUNTER — Ambulatory Visit (INDEPENDENT_AMBULATORY_CARE_PROVIDER_SITE_OTHER): Payer: Self-pay | Admitting: Student

## 2021-02-06 ENCOUNTER — Other Ambulatory Visit: Payer: Self-pay

## 2021-02-06 VITALS — BP 151/87 | HR 73 | Temp 98.2°F | Wt 217.7 lb

## 2021-02-06 DIAGNOSIS — I10 Essential (primary) hypertension: Secondary | ICD-10-CM

## 2021-02-06 DIAGNOSIS — F1120 Opioid dependence, uncomplicated: Secondary | ICD-10-CM

## 2021-02-06 DIAGNOSIS — F1020 Alcohol dependence, uncomplicated: Secondary | ICD-10-CM

## 2021-02-06 MED ORDER — BUPRENORPHINE HCL-NALOXONE HCL 8-2 MG SL SUBL
1.0000 | SUBLINGUAL_TABLET | Freq: Three times a day (TID) | SUBLINGUAL | 1 refills | Status: DC
  Filled 2021-02-06: qty 42, 14d supply, fill #0
  Filled 2021-02-20: qty 42, 14d supply, fill #1

## 2021-02-06 NOTE — Assessment & Plan Note (Addendum)
Patient was recently hospitalized at [facility] for alcohol withdrawal. Since discharge, patient reports he has been feeling much better. He states he has been weaning himself off of alcohol. He currently endorses drinking once or twice a week, usually with just a beer or two at a time. He currently has some stressors with his daughter and grandchild, but overall feels like he is in a much better place. Denies constipation, diarrhea, nausea, vomiting, abdominal pain, SI/HI.   We discussed possible use of medications for alcohol use disorder. Patient feels like he currently does not need treatment and would like to continue weaning off by himself.  We will obtain follow-up labs from his recent hospitalization today and have patient follow-up with PCP in one month. Of note, if patient desires treatment in the future will need to avoid naltrexone while he is taking Suboxone.  - Mg, BMP today - Continue monitoring for signs of abuse, withdrawals

## 2021-02-06 NOTE — Assessment & Plan Note (Signed)
Patient reports compliance with twice daily Suboxone. Denies any cravings or relapses. Feels as though his symptoms have been well-controlled with minimal side effects. Denies constipation, palpitations, nausea, vomiting. Does mention some dental pain and would like to go to a dentist once he has his orange card. Today we will obtain ToxAssure and have him follow-up in four weeks  - Suboxone 8-2mg  three times daily - ToxAssure today - Follow-up in four weeks

## 2021-02-06 NOTE — Progress Notes (Signed)
   02/06/2021  Mark [name] presents for follow up of opioid use disorder I have reviewed the prior induction visit, follow up visits, and telephone encounters relevant to opiate use disorder (OUD) treatment.   Current daily dose: Suboxone 8-2mg  three times daily  Date of Induction: 05/25/2019 in our clinic  Current follow up interval, in weeks: 4 weeks  The patient has been adherent with the buprenorphine for OUD contract.   Last UDS Result: 10/17/2020 - Appropriate  HPI: Mark [name] is 55yo person presenting for OUD follow-up. Patient reports compliance with twice daily Suboxone. Denies any cravings or relapses. Feels as though his symptoms have been well-controlled with minimal side effects. Denies constipation, palpitations, nausea, vomiting. Does mention some dental pain and would like to go to a dentist once he has his orange card.  Exam:   Vitals:   02/06/21 0919 02/06/21 0920  BP:  (!) 151/87  Pulse:  73  Temp:  98.2 F (36.8 C)  TempSrc:  Oral  SpO2:  97%  Weight: 217 lb 11.2 oz (98.7 kg)    General: Resting comfortably in no acute distress CV: Regular rate, rhythm. No murmurs appreciated. Pulm: Normal work of breathing on room air. Clear to ausculation bilaterally.  Neuro: Awake, alert, conversing appropriately. No focal deficits. Psych: Normal mood, affect, speech  Assessment/Plan:  See Problem Based Charting in the Encounters Tab  Sanjuan Dame, MD 02/06/2021  1:35 PM

## 2021-02-06 NOTE — Patient Instructions (Signed)
Mr.Yardley Mathis Dad, it was a pleasure seeing you today!  Today we discussed: - We have sent in your Suboxone. Please come back in one month for a check-up with PCP.  - We will get lab work today as a hospital follow-up.  Follow-up:  1 month    Please make sure to arrive 15 minutes prior to your next appointment. If you arrive late, you may be asked to reschedule.   We look forward to seeing you next time. Please call our clinic at [phone] if you have any questions or concerns. The best time to call is Monday-Friday from 9am-4pm, but there is someone available 24/7. If after hours or the weekend, call the main hospital number and ask for the Internal Medicine Resident On-Call. If you need medication refills, please notify your pharmacy one week in advance and they will send [place] a request.  Thank you for letting us take part in your care. Wishing you the best!  Thank you, Sanjuan Dame, MD

## 2021-02-07 LAB — BMP8+ANION GAP
Anion Gap: 18 mmol/L (ref 10.0–18.0)
BUN/Creatinine Ratio: 4 — ABNORMAL LOW (ref 9–20)
BUN: 3 mg/dL — ABNORMAL LOW (ref 6–24)
CO2: 25 mmol/L (ref 20–29)
Calcium: 9.7 mg/dL (ref 8.7–10.2)
Chloride: 94 mmol/L — ABNORMAL LOW (ref 96–106)
Creatinine, Ser: 0.81 mg/dL (ref 0.76–1.27)
Glucose: 124 mg/dL — ABNORMAL HIGH (ref 70–99)
Potassium: 4.7 mmol/L (ref 3.5–5.2)
Sodium: 137 mmol/L (ref 134–144)
eGFR: 104 mL/min/{1.73_m2} (ref >59)

## 2021-02-07 LAB — MAGNESIUM: Magnesium: 2.1 mg/dL (ref 1.6–2.3)

## 2021-02-09 NOTE — Progress Notes (Signed)
Internal Medicine Clinic Attending ? ?Case discussed with Dr. Braswell  At the time of the visit.  We reviewed the resident?s history and exam and pertinent patient test results.  I agree with the assessment, diagnosis, and plan of care documented in the resident?s note.  ?

## 2021-02-13 LAB — TOXASSURE SELECT,+ANTIDEPR,UR

## 2021-02-14 ENCOUNTER — Other Ambulatory Visit (HOSPITAL_COMMUNITY): Payer: Self-pay

## 2021-02-20 ENCOUNTER — Other Ambulatory Visit (HOSPITAL_COMMUNITY): Payer: Self-pay

## 2021-02-22 ENCOUNTER — Ambulatory Visit (INDEPENDENT_AMBULATORY_CARE_PROVIDER_SITE_OTHER): Payer: Self-pay | Admitting: Student

## 2021-02-22 ENCOUNTER — Encounter: Payer: Self-pay | Admitting: Student

## 2021-02-22 ENCOUNTER — Other Ambulatory Visit (HOSPITAL_COMMUNITY): Payer: Self-pay

## 2021-02-22 VITALS — BP 153/83 | HR 65 | Temp 98.4°F | Wt 217.4 lb

## 2021-02-22 DIAGNOSIS — I10 Essential (primary) hypertension: Secondary | ICD-10-CM

## 2021-02-22 DIAGNOSIS — F1120 Opioid dependence, uncomplicated: Secondary | ICD-10-CM

## 2021-02-22 DIAGNOSIS — M25532 Pain in left wrist: Secondary | ICD-10-CM | POA: Insufficient documentation

## 2021-02-22 DIAGNOSIS — F418 Other specified anxiety disorders: Secondary | ICD-10-CM

## 2021-02-22 DIAGNOSIS — F1020 Alcohol dependence, uncomplicated: Secondary | ICD-10-CM

## 2021-02-22 DIAGNOSIS — I739 Peripheral vascular disease, unspecified: Secondary | ICD-10-CM

## 2021-02-22 DIAGNOSIS — F419 Anxiety disorder, unspecified: Secondary | ICD-10-CM

## 2021-02-22 DIAGNOSIS — E782 Mixed hyperlipidemia: Secondary | ICD-10-CM

## 2021-02-22 MED ORDER — ROSUVASTATIN CALCIUM 20 MG PO TABS
20.0000 mg | ORAL_TABLET | Freq: Every day | ORAL | 11 refills | Status: DC
  Filled 2021-02-22: qty 30, 30d supply, fill #0

## 2021-02-22 MED ORDER — HYDROXYZINE HCL 25 MG PO TABS
25.0000 mg | ORAL_TABLET | Freq: Three times a day (TID) | ORAL | 0 refills | Status: DC | PRN
  Filled 2021-02-22: qty 90, 30d supply, fill #0

## 2021-02-22 MED ORDER — AMLODIPINE BESYLATE 5 MG PO TABS
5.0000 mg | ORAL_TABLET | Freq: Every day | ORAL | 11 refills | Status: DC
  Filled 2021-02-22: qty 30, 30d supply, fill #0

## 2021-02-22 MED ORDER — BUPRENORPHINE HCL-NALOXONE HCL 8-2 MG SL SUBL
1.0000 | SUBLINGUAL_TABLET | Freq: Three times a day (TID) | SUBLINGUAL | 0 refills | Status: DC
  Filled 2021-02-22 – 2021-02-27 (×2): qty 42, 14d supply, fill #0

## 2021-02-22 NOTE — Assessment & Plan Note (Addendum)
Physical exam reveals bilateral feet were cold to touch.  He states that this has been an issue for many years.  He endorses pain calf pain, right worse than left with walking.  Also endorses neuropathy pain at the bottom of his feet.  ABI performed in office showed severe PAD of the right leg.  Do not think patient has an acute limb ischemia.  -Urgent referral to vascular surgery -Start Crestor 20 mg -Advised patient to take aspirin 81 mg.  Denied history of GI bleeding -Check lipid panel -Last A1c was 6

## 2021-02-22 NOTE — Progress Notes (Signed)
CC: left hand pain   HPI:  Mark [name] is a 55 y.o. with past medical history of alcohol use disorder, ODD on Suboxone, hypertension, and COPD who presented to the clinic for evaluation of his left hand after a fall.  Please see problem based charting for detail.  Past Medical History:  Diagnosis Date   Alcohol withdrawal ([place])    Allergy    Anxiety Dx 2014   Chronic hip pain (Right) 03/23/2018   Chronic low back pain (Secondary area of Pain) (Bilateral) (R>L) 12/28/2012   Chronic lower extremity pain (Bilateral) (R>L) 06/26/2016   Chronic musculoskeletal pain 04/15/2016   Chronic radicular low back pain (Third area of Pain) (Right) (to calf) 06/02/2014   Chronic sacroiliac joint pain (Bilateral) (R>L) 08/22/2016   Chronic sacroiliac joint pain (Left) 03/23/2018   Chronic shoulder pain (Right) 05/12/2014   [place] in March 2016 on right shoulder. 06/22/2014: [facility] [place]:  Partial full-thickness tear with minimal retraction of supraspinatus. Status post injection and referral to PT    COPD (chronic obstructive pulmonary disease) ([place])    Drug abuse ([place])    pt reports opioid dependence due to previous back surgeries   Dyspepsia    Elevated LFTs 08/15/2018   Enlarged prostate    ETOH abuse    GERD (gastroesophageal reflux disease) Dx 2003   Headache(784.0)    Hepatitis    Hyperalgesia (Lower extremities) 12/26/2016   Hyperlipidemia Dx 2000   Hypertension Dx 2011   Hypokalemia    Hyponatremia 08/15/2018   IBS (irritable bowel syndrome)    alternates constiaption/ diarrhea   Irregular heart beat    Long Q-T syndrome 01/23/2014   Mental disorder    Neuromuscular disorder ([place])    hands hurt    Neuropathy 06/07/2013   Opiate use 05/14/2016   Pain management contract broken 12/15/2017   Pain medication contract broken on 12/15/2017 and again around 05/17/2019.  The patient continues to be noncompliant with the medication rules and regulations.  He also continues to consume alcohol and to  use more medication than prescribed.    Seizure due to alcohol withdrawal ([place]) 01/23/2014   Per patient report   Shortness of breath    Withdrawal seizures ([place]) 2014   last seizure 2014 etoh with drawal   Review of Systems:  per HPI  Physical Exam:  Vitals:   02/22/21 1440  BP: (!) 153/83  Pulse: 65  Temp: 98.4 F (36.9 C)  TempSrc: Oral  SpO2: 98%  Weight: 217 lb 6.4 oz (98.6 kg)   Physical Exam Constitutional:      General: He is not in acute distress.    Appearance: He is not toxic-appearing.  HENT:     Head: Normocephalic.  Eyes:     General:        Right eye: No discharge.        Left eye: No discharge.     Conjunctiva/sclera: Conjunctivae normal.  Cardiovascular:     Rate and Rhythm: Normal rate and regular rhythm.  Pulmonary:     Effort: Pulmonary effort is normal. No respiratory distress.     Breath sounds: Normal breath sounds. No wheezing.  Abdominal:     General: Bowel sounds are normal.     Palpations: Abdomen is soft.  Musculoskeletal:     Comments: Pain to palpation in the left thenar eminence.  No erythema or hematoma.  Normal range of motion of left wrist and elbow. Bilateral feet are cold to touch.  Diminished pulses bilaterally, right worse than left.  Skin:    General: Skin is warm.  Neurological:     General: No focal deficit present.     Mental Status: He is alert and oriented to person, place, and time.     Assessment & Plan:   See Encounters Tab for problem based charting.  Patient discussed with Dr. Dareen Piano

## 2021-02-22 NOTE — Patient Instructions (Signed)
Mr. Willetts,  It was a pleasure seeing you in the clinic today.  Here is a summary what we talked about:  1.  Poor perfusion of your right leg: I will call the vascular office to schedule a urgent appointment.  We will check a lipid panel today and start you on Crestor 20 mg.  Please start taking a baby aspirin daily.  Please cut back on smoking, which is one of the biggest risk factors for your blood flow issue.  2.  High blood pressure: I will start amlodipine 5 mg.  Please continue metoprolol.  3.  Patient to cut back on alcohol use.  If you need help, please reach out to [place] for more resources.  4.  I refill your hydroxyzine for anxiety.  5.  I will order a wrist x-ray to rule out fracture.  Return in 4 weeks  Take care,  Dr. Alfonse Spruce

## 2021-02-22 NOTE — Addendum Note (Signed)
Addended byGaylan Gerold on: 02/22/2021 05:35 PM   Modules accepted: Orders

## 2021-02-22 NOTE — Assessment & Plan Note (Signed)
Patient with history of alcohol use disorder with multiple withdrawal episodes in the past.    Most recent one is in November.Said that he used to drink 12 beers a day.  He said that he has cut back significantly after November.  He has not had any alcoholic drinks in the last week.  His tox assure on 11/29 however showed significant level of ethanol in urine.  Patient states that he does not remember if he drank the previous day.   I offer resources such as medication or detox program to help but he declined.  He said that he is doing well on his own and will try to cut back completely.  I again reemphasized the importance of alcohol abstinence.  He verbalized understanding.  He said that he used to drink alcohol to help with anxiety.  I offer assistance with his anxiety however he declined.  Said that hydroxyzine works well for him.

## 2021-02-22 NOTE — Assessment & Plan Note (Signed)
Patient chest refill his 2-week Suboxone on 12/13.  He asks if he can get his next refill early.  States that he has to leave town on December 24 and comes back on December 30 to see his daughter on Christmas.  States that he will be in town to pick up his next refill.  -Will send his next 2 weeks refill early.

## 2021-02-22 NOTE — Assessment & Plan Note (Signed)
Patient states that he self medicate with alcohol for his anxiety.  He denies any specific phobias.  He was evaluated by behavioral health providers in the past and was started on many medications such as Effexor.  He could not tolerate any of them due to side effects.  GAD-7 score of 7, suggests mild anxiety. I offered to start Lexapro but he declined. States that Hydroxyzine is working well for him. He also declined referral to Dr. Theodis Shove.  - Refill Hydroxyzine

## 2021-02-22 NOTE — Assessment & Plan Note (Addendum)
Patient endorses pain of his left wrist after a fall 3 weeks ago.  Reports tripping on the concrete floor and fell on outstretched hand.  Endorses pain in the thenar eminence.  Pain gradually worsened.  Physical exam revealed tenderness to palpation of the left thenar eminence.  There was no erythema or swelling.  He has normal range of motion of left wrist and left elbow.  High suspicion for scaphoid fracture.  Low suspicion for Colles' fracture.  -Obtain x-ray of left wrist  Addendum No fracture seen on Left wrist xray. Advised conservative treatments

## 2021-02-22 NOTE — Assessment & Plan Note (Signed)
Blood pressure elevated at 143/83.  He reports taking metoprolol 25 mg twice daily prescriber Dr. Terrence Dupont, likely for his PVCs.  -Add amlodipine 5 mg -Reassess in 4 weeks

## 2021-02-23 LAB — LIPID PANEL
Chol/HDL Ratio: 6.2 ratio — ABNORMAL HIGH (ref 0.0–5.0)
Cholesterol, Total: 230 mg/dL — ABNORMAL HIGH (ref 100–199)
HDL: 37 mg/dL — ABNORMAL LOW (ref >39)
LDL Chol Calc (NIH): 145 mg/dL — ABNORMAL HIGH (ref 0–99)
Triglycerides: 262 mg/dL — ABNORMAL HIGH (ref 0–149)
VLDL Cholesterol Cal: 48 mg/dL — ABNORMAL HIGH (ref 5–40)

## 2021-02-23 NOTE — Progress Notes (Signed)
Internal Medicine Clinic Attending  Case discussed with Dr. Nguyen  At the time of the visit.  We reviewed the resident's history and exam and pertinent patient test results.  I agree with the assessment, diagnosis, and plan of care documented in the resident's note. 

## 2021-02-24 ENCOUNTER — Other Ambulatory Visit (HOSPITAL_COMMUNITY): Payer: Self-pay

## 2021-02-26 ENCOUNTER — Other Ambulatory Visit: Payer: Self-pay

## 2021-02-26 ENCOUNTER — Ambulatory Visit (HOSPITAL_COMMUNITY)
Admission: RE | Admit: 2021-02-26 | Discharge: 2021-02-26 | Disposition: A | Discharge status: Home / Self Care | Payer: Self-pay | Source: Ambulatory Visit | Attending: Internal Medicine | Admitting: Internal Medicine

## 2021-02-26 DIAGNOSIS — M25532 Pain in left wrist: Secondary | ICD-10-CM | POA: Insufficient documentation

## 2021-02-26 NOTE — Assessment & Plan Note (Addendum)
LDL elevated at 145. Given his PAD, goal LDL will be < 70.  -Continue Crestor -Repeat Lipid in 3 months

## 2021-02-27 ENCOUNTER — Other Ambulatory Visit (HOSPITAL_COMMUNITY): Payer: Self-pay

## 2021-03-01 ENCOUNTER — Encounter: Payer: Self-pay | Admitting: Internal Medicine

## 2021-03-08 ENCOUNTER — Encounter: Payer: Self-pay | Admitting: Gastroenterology

## 2021-03-19 ENCOUNTER — Other Ambulatory Visit: Payer: Self-pay | Admitting: Internal Medicine

## 2021-03-19 DIAGNOSIS — I10 Essential (primary) hypertension: Secondary | ICD-10-CM

## 2021-03-20 ENCOUNTER — Other Ambulatory Visit (HOSPITAL_COMMUNITY): Payer: Self-pay

## 2021-03-20 ENCOUNTER — Ambulatory Visit (INDEPENDENT_AMBULATORY_CARE_PROVIDER_SITE_OTHER): Payer: Self-pay | Admitting: Internal Medicine

## 2021-03-20 ENCOUNTER — Other Ambulatory Visit: Payer: Self-pay

## 2021-03-20 ENCOUNTER — Encounter: Payer: Self-pay | Admitting: Internal Medicine

## 2021-03-20 DIAGNOSIS — F1120 Opioid dependence, uncomplicated: Secondary | ICD-10-CM

## 2021-03-20 DIAGNOSIS — I10 Essential (primary) hypertension: Secondary | ICD-10-CM

## 2021-03-20 MED ORDER — ISOSORBIDE MONONITRATE ER 30 MG PO TB24
30.0000 mg | ORAL_TABLET | Freq: Every day | ORAL | 1 refills | Status: DC
  Filled 2021-03-20: qty 30, 30d supply, fill #0

## 2021-03-20 MED ORDER — BUPRENORPHINE HCL-NALOXONE HCL 8-2 MG SL SUBL
1.0000 | SUBLINGUAL_TABLET | Freq: Three times a day (TID) | SUBLINGUAL | 1 refills | Status: DC
  Filled 2021-03-20: qty 42, 14d supply, fill #0
  Filled 2021-04-02: qty 42, 14d supply, fill #1

## 2021-03-20 MED ORDER — AMLODIPINE BESYLATE 10 MG PO TABS
10.0000 mg | ORAL_TABLET | Freq: Every day | ORAL | 11 refills | Status: DC
Start: 2021-03-20 — End: 2021-06-14
  Filled 2021-03-20: qty 30, 30d supply, fill #0

## 2021-03-20 MED ORDER — METOPROLOL TARTRATE 25 MG PO TABS
ORAL_TABLET | Freq: Two times a day (BID) | ORAL | 2 refills | Status: DC
Start: 2021-03-20 — End: 2021-04-19
  Filled 2021-03-20: qty 60, 30d supply, fill #0
  Filled 2021-04-18: qty 60, 30d supply, fill #1

## 2021-03-20 NOTE — Assessment & Plan Note (Addendum)
Well controlled on suboxone 8-2mg  TID. No illicit substance use since LOV. PDMP appropriate. Continue current management

## 2021-03-20 NOTE — Patient Instructions (Signed)
Suboxone has been refilled.   I have increased your amlodipine dose to 10mg , in addition to adding another blood pressure medicine called Imdur.  Please monitor your blood pressures at home and follow up with [place] in 2-4 weeks. Please bring your blood pressure monitor to that visit so we can calibrate it.

## 2021-03-20 NOTE — Assessment & Plan Note (Signed)
Blood pressure persistently elevated in the office since November. He had been admitted to the hospital at that time with alcohol withdrawal and was found to be hyponatremic to 120. Hctz was discontinued that admission. When he was seen in the office in December, amlodipine 5mg  was added to metoprolol 25mg .  Mark [name] monitors his blood pressures at home daily and notes the systolic value to be consistently in the 150-160s mmHg.  Antihypertensive treatment options are limited from several perspectives. Hx of angioedema with lisinopril, hyponatremia with hctz in the setting of alcohol use, chronic borderline elevated potassium (last 4.7) making spironolactone less ideal.  Plan  Increase amlodipine to 10mg   Add imdur 30mg  daily  Continue metoprolol 25mg  daily  He will keep a blood pressure log over the next four weeks. He will bring his home blood pressure machine and the log to his next visit in 4 weeks for review.

## 2021-03-21 NOTE — Progress Notes (Signed)
° °  Office Visit   Patient ID: Mark [name], male    DOB: 05/03/1965, 56 y.o.   MRN: [id number]   PCP: [name], MD   Subjective:  CC: Medication Refill (Suboxone. Metropolol) and Hypertension   Mark [name] is a 56 y.o. year old male who presents for the above medical condition(s). Please refer to problem based charting for assessment and plan. Objective:   BP (!) 158/89 (BP Location: Right Arm, Cuff Size: Normal)    Pulse 79    Temp 98.5 F (36.9 C) (Oral)    Resp (!) 28    Ht 6\' 4"  (1.93 m)    Wt 219 lb 8 oz (99.6 kg)    SpO2 99% Comment: room air   BMI 26.72 kg/m   General: well appearing, no distress Cardiac: RRR Pulm: breathing comfortably on room air, lungs clear  Assessment & Plan:   Problem List Items Addressed This Visit       Cardiovascular and Mediastinum   Essential hypertension (Chronic)    Blood pressure persistently elevated in the office since November. He had been admitted to the hospital at that time with alcohol withdrawal and was found to be hyponatremic to 120. Hctz was discontinued that admission. When he was seen in the office in December, amlodipine 5mg  was added to metoprolol 25mg .  Quasim monitors his blood pressures at home daily and notes the systolic value to be consistently in the 150-160s mmHg.  Antihypertensive treatment options are limited from several perspectives. Hx of angioedema with lisinopril, hyponatremia with hctz in the setting of alcohol use, chronic borderline elevated potassium (last 4.7) making spironolactone less ideal.  Plan Increase amlodipine to 10mg  Add imdur 30mg  daily Continue metoprolol 25mg  daily He will keep a blood pressure log over the next four weeks. He will bring his home blood pressure machine and the log to his next visit in 4 weeks for review.      Relevant Medications   amLODipine (NORVASC) 10 MG tablet   isosorbide mononitrate (IMDUR) 30 MG 24 hr tablet     Other   Opioid use disorder, moderate,  dependence ([name]) (Chronic)    Well controlled on suboxone 8-2mg  TID. No illicit substance use since LOV. PDMP appropriate. Continue current management      Relevant Medications   buprenorphine-naloxone (SUBOXONE) 8-2 mg SUBL SL tablet     Return in about 4 weeks (around 04/17/2021) for Blood pressure follow up.   Pt discussed with Dr. Forde Radon, MD Internal Medicine Resident PGY-3 [facility] 03/21/2021 5:51 AM

## 2021-03-23 ENCOUNTER — Other Ambulatory Visit: Payer: Self-pay

## 2021-03-23 DIAGNOSIS — I739 Peripheral vascular disease, unspecified: Secondary | ICD-10-CM

## 2021-03-28 ENCOUNTER — Other Ambulatory Visit (HOSPITAL_COMMUNITY): Payer: Self-pay

## 2021-03-28 NOTE — Progress Notes (Signed)
Internal Medicine Clinic Attending  Case discussed with Dr. Christian  At the time of the visit.  We reviewed the resident's history and exam and pertinent patient test results.  I agree with the assessment, diagnosis, and plan of care documented in the resident's note.  

## 2021-04-02 ENCOUNTER — Other Ambulatory Visit (HOSPITAL_COMMUNITY): Payer: Self-pay

## 2021-04-02 NOTE — Progress Notes (Signed)
[facility] OF [place]  ASSESSMENT / PLAN: Mark [name] is a 56 y.o. male without hemodynamically significant peripheral arterial disease on physical exam or non-invasive testing in our lab. Most of his discomfort seems to be from peripheral neuropathy and/or orthopedic issues. He can follow up with me as needed.   CHIEF COMPLAINT: bilateral foot pain, abnormal screening test  HISTORY OF PRESENT ILLNESS: Mark [name] is a 56 y.o. male who presents to clinic for evaluation of abnormal screening test.  He is primary care team performed and in office screening test for peripheral arterial disease, which was concerning.  This prompted a self-referral to our office for evaluation.  The patient has an established diagnosis of peripheral neuropathy, taking gabapentin daily with some relief.  He does not report typical symptoms of calf claudication.  He does not have classic ischemic rest pain symptoms.  He does report some wounds on his feet, but these are healing easily, as is typical for him.  Past Medical History:  Diagnosis Date   Alcohol withdrawal ([place])    Allergy    Anxiety Dx 2014   Chronic hip pain (Right) 03/23/2018   Chronic low back pain (Secondary area of Pain) (Bilateral) (R>L) 12/28/2012   Chronic lower extremity pain (Bilateral) (R>L) 06/26/2016   Chronic musculoskeletal pain 04/15/2016   Chronic radicular low back pain (Third area of Pain) (Right) (to calf) 06/02/2014   Chronic sacroiliac joint pain (Bilateral) (R>L) 08/22/2016   Chronic sacroiliac joint pain (Left) 03/23/2018   Chronic shoulder pain (Right) 05/12/2014   [place] in March 2016 on right shoulder. 06/22/2014: [facility] [place]:  Partial full-thickness tear with minimal retraction of supraspinatus. Status post injection and referral to PT    COPD (chronic obstructive pulmonary disease) ([place])    Drug abuse ([place])    pt reports opioid dependence due to previous back surgeries   Dyspepsia    Elevated LFTs  08/15/2018   Enlarged prostate    ETOH abuse    GERD (gastroesophageal reflux disease) Dx 2003   Headache(784.0)    Hepatitis    Hyperalgesia (Lower extremities) 12/26/2016   Hyperlipidemia Dx 2000   Hypertension Dx 2011   Hypokalemia    Hyponatremia 08/15/2018   IBS (irritable bowel syndrome)    alternates constiaption/ diarrhea   Irregular heart beat    Long Q-T syndrome 01/23/2014   Mental disorder    Neuromuscular disorder ([place])    hands hurt    Neuropathy 06/07/2013   Opiate use 05/14/2016   Pain management contract broken 12/15/2017   Pain medication contract broken on 12/15/2017 and again around 05/17/2019.  The patient continues to be noncompliant with the medication rules and regulations.  He also continues to consume alcohol and to use more medication than prescribed.    Seizure due to alcohol withdrawal ([place]) 01/23/2014   Per patient report   Shortness of breath    Withdrawal seizures ([place]) 2014   last seizure 2014 etoh with drawal    Past Surgical History:  Procedure Laterality Date   57 HOUR [place] STUDY N/A 01/16/2015   Procedure: 24 HOUR PH STUDY;  Surgeon: Manus Gunning, MD;  Location: [facility];  Service: Gastroenterology;  Laterality: N/A;   [place] WITH CORONARY ANGIOGRAM N/A 01/13/2014   Procedure: LEFT HEART CATHETERIZATION WITH CORONARY ANGIOGRAM;  Surgeon: Clent Demark, MD;  Location: [facility];  Service: Cardiovascular;  Laterality:  N/A;   NASAL SINUS SURGERY     NECK SURGERY  2000   cervial fusion    NISSEN FUNDOPLICATION     SHOULDER SURGERY Right    SUBACROMIAL DECOMPRESSION Right 11/16/2014   Procedure: RIGHT SHOULDER ARTHROSCOPY WITH SUBACROMIAL DECOMPRESSION ;  Surgeon: Leandrew Koyanagi, MD;  Location: [facility];  Service: Orthopedics;  Laterality: Right;   UPPER GASTROINTESTINAL ENDOSCOPY      Family History  Problem Relation Age of Onset   Lung cancer Father         was a smoker   Alcohol abuse Father    Hypertension Mother    Colon polyps Mother    Breast cancer Paternal Grandmother    Alcohol abuse Paternal Grandfather    Colon cancer Neg Hx    Rectal cancer Neg Hx    Stomach cancer Neg Hx     Social History   Socioeconomic History   Marital status: Divorced    Spouse name: Not on file   Number of children: 2   Years of education: Not on file   Highest education level: Not on file  Occupational History   Occupation: Unemployed  Tobacco Use   Smoking status: Every Day    Packs/day: 1.00    Years: 30.00    Pack years: 30.00    Types: Cigarettes   Smokeless tobacco: Never   Tobacco comments:    .5 PPD- 1ppd   Vaping Use   Vaping Use: Never used  Substance and Sexual Activity   Alcohol use: Yes    Comment: Drinks beer.   Drug use: No    Comment: Opana   Sexual activity: Not Currently  Other Topics Concern   Not on file  Social History Narrative   Pt lives with mother.  Not unemployed due to chronic pain, but does not receive disability.  Not followed by an outpatient provider.   Social Determinants of Health   Financial Resource Strain: Not on file  Food Insecurity: Not on file  Transportation Needs: Not on file  Physical Activity: Not on file  Stress: Not on file  Social Connections: Not on file  Intimate Partner Violence: Not on file    Allergies  Allergen Reactions   Cozaar [Losartan] Anaphylaxis   Librium [Chlordiazepoxide] Nausea Only and Other (See Comments)    "Tears up my guts" (SEVERE NAUSEA- COMES CLOSE TO VOMITING- SEVERE DRY HEAVES)   Lisinopril Anaphylaxis    Other reaction(s): Angioedema (ALLERGY/intolerance)   Tylenol [Acetaminophen] Other (See Comments)    CANNOT TAKE DUE TO LIVER ISSUE(S)    Current Outpatient Medications  Medication Sig Dispense Refill   albuterol (VENTOLIN HFA) 108 (90 Base) MCG/ACT inhaler Inhale 1-2 puffs into the lungs every 6 (six) hours as needed for wheezing or  shortness of breath. 8.5 g 1   amLODipine (NORVASC) 10 MG tablet Take 1 tablet by mouth daily. 30 tablet 11   aspirin EC 81 MG tablet Take 81 mg by mouth daily. Swallow whole.     buprenorphine-naloxone (SUBOXONE) 8-2 mg SUBL SL tablet Place 1 tablet under the tongue 3 times daily. 42 tablet 1   diclofenac Sodium (VOLTAREN) 1 % GEL APPLY 4 GRAMS TO AFFECTED AREA 4 TIMES DAILY (Patient taking differently: Apply 4 g topically 4 (four) times daily as needed (pain).) 480 g 2   dicyclomine (BENTYL) 10 MG capsule TAKE 2 CAPSULES BY MOUTH EVERY 8 HOURS AS NEEDED FOR SPASMS (Patient taking differently: Take 20 mg by mouth  every 8 (eight) hours as needed for spasms.) 90 capsule 2   folic acid (FOLVITE) 1 MG tablet Take 1 tablet (1 mg total) by mouth daily.     gabapentin (NEURONTIN) 100 MG capsule Take 2 capsules by mouth in the morning and take 2 capsules daily with lunch and 1 capsule  every evening. 150 capsule 2   isosorbide mononitrate (IMDUR) 30 MG 24 hr tablet Take 1 tablet by mouth daily. 30 tablet 1   metoprolol tartrate (LOPRESSOR) 25 MG tablet TAKE 1 TABLET BY MOUTH 2 TIMES DAILY 60 tablet 2   Multiple Vitamin (MULTIVITAMIN WITH MINERALS) TABS tablet Take 1 tablet by mouth daily.     rosuvastatin (CRESTOR) 20 MG tablet Take 1 tablet by mouth daily. 30 tablet 11   senna-docusate (SENOKOT-S) 8.6-50 MG tablet Take 1 tablet by mouth 2 (two) times daily.     tamsulosin (FLOMAX) 0.4 MG CAPS capsule Take 1 capsule  by mouth daily. 30 capsule 1   thiamine 100 MG tablet Take 1 tablet (100 mg total) by mouth daily.     No current facility-administered medications for this visit.    REVIEW OF SYSTEMS:  [X]  denotes positive finding, [ ]  denotes negative finding Cardiac  Comments:  Chest pain or chest pressure:    Shortness of breath upon exertion:    Short of breath when lying flat:    Irregular heart rhythm:        Vascular    Pain in calf, thigh, or hip brought on by ambulation:    Pain in feet  at night that wakes you up from your sleep:     Blood clot in your veins:    Leg swelling:         Pulmonary    Oxygen at home:    Productive cough:     Wheezing:         Neurologic    Sudden weakness in arms or legs:     Sudden numbness in arms or legs:     Sudden onset of difficulty speaking or slurred speech:    Temporary loss of vision in one eye:     Problems with dizziness:         Gastrointestinal    Blood in stool:     Vomited blood:         Genitourinary    Burning when urinating:     Blood in urine:        Psychiatric    Major depression:         Hematologic    Bleeding problems:    Problems with blood clotting too easily:        Skin    Rashes or ulcers:        Constitutional    Fever or chills:      PHYSICAL EXAM Vitals:   04/03/21 1248  BP: 131/83  Pulse: 62  Resp: 20  Temp: 98.3 F (36.8 C)  SpO2: 93%  Weight: 215 lb (97.5 kg)  Height: 6\' 4"  (1.93 m)    Constitutional: well appearing in no distress. Appears well nourished.  Neurologic: CN intact. no focal findings. no sensory loss. Psychiatric: Mood and affect symmetric and appropriate. Eyes: No icterus. No conjunctival pallor. Ears, nose, throat: mucous membranes moist. Midline trachea.  Cardiac: regular rate and rhythm.  Respiratory: unlabored. Abdominal: soft, non-tender, non-distended.  Peripheral vascular:  Popliteal pulses bilaterally  2+ DP pulses bilaterally Extremity: No edema. No cyanosis. No pallor.  Skin: No gangrene. No ulceration.  Lymphatic: No Stemmer's sign. No palpable lymphadenopathy.   PERTINENT LABORATORY AND RADIOLOGIC DATA  Most recent CBC CBC Latest Ref Rng & Units 01/16/2021 01/15/2021 01/14/2021  WBC 4.0 - 10.5 K/uL 7.9 8.3 9.7  Hemoglobin 13.0 - 17.0 g/dL 13.0 13.5 14.7  Hematocrit 39.0 - 52.0 % 37.7(L) 39.2 42.9  Platelets 150 - 400 K/uL 202 215 250     Most recent CMP CMP Latest Ref Rng & Units 02/06/2021 01/18/2021 01/17/2021  Glucose 70 - 99 mg/dL  124(H) 242(H) 183(H)  BUN 6 - 24 mg/dL 3(L) 7 9  Creatinine 0.76 - 1.27 mg/dL 0.81 0.67 0.64  Sodium 134 - 144 mmol/L 137 136 134(L)  Potassium 3.5 - 5.2 mmol/L 4.7 4.3 4.0  Chloride 96 - 106 mmol/L 94(L) 98 98  CO2 20 - 29 mmol/L 25 30 31   Calcium 8.7 - 10.2 mg/dL 9.7 9.3 9.1  Total Protein 6.5 - 8.1 g/dL - - -  Total Bilirubin 0.3 - 1.2 mg/dL - - -  Alkaline Phos 38 - 126 U/L - - -  AST 15 - 41 U/L - - -  ALT 0 - 44 U/L - - -    Renal function CrCl cannot be calculated (Patient's most recent lab result is older than the maximum 21 days allowed.).  Hgb A1c MFr Bld (%)  Date Value  01/14/2021 6.0 (H)    LDL Chol Calc (NIH)  Date Value Ref Range Status  02/22/2021 145 (H) 0 - 99 mg/dL Final     Vascular Imaging:  +-------+-----------+-----------+------------+------------+   ABI/TBI Today's ABI Today's TBI Previous ABI Previous TBI   +-------+-----------+-----------+------------+------------+   Right   1.15        0.94                                    +-------+-----------+-----------+------------+------------+   Left    1.20        0.74                                    +-------+-----------+-----------+------------+------------+   Yevonne Aline. Stanford Breed, MD [facility] Phone Number: [phone] 04/03/2021 12:56 PM  Total time spent on preparing this encounter including chart review, data review, collecting history, examining the patient, coordinating care for this new patient, 45 minutes.  Portions of this report may have been transcribed using voice recognition software.  Every effort has been made to ensure accuracy; however, inadvertent computerized transcription errors may still be present.

## 2021-04-03 ENCOUNTER — Other Ambulatory Visit: Payer: Self-pay

## 2021-04-03 ENCOUNTER — Ambulatory Visit (INDEPENDENT_AMBULATORY_CARE_PROVIDER_SITE_OTHER): Payer: Self-pay | Admitting: Vascular Surgery

## 2021-04-03 ENCOUNTER — Encounter: Payer: Self-pay | Admitting: Vascular Surgery

## 2021-04-03 ENCOUNTER — Ambulatory Visit (HOSPITAL_COMMUNITY)
Admission: RE | Admit: 2021-04-03 | Discharge: 2021-04-03 | Disposition: A | Discharge status: Home / Self Care | Payer: Self-pay | Source: Ambulatory Visit | Attending: Vascular Surgery | Admitting: Vascular Surgery

## 2021-04-03 VITALS — BP 131/83 | HR 62 | Temp 98.3°F | Resp 20 | Ht 76.0 in | Wt 215.0 lb

## 2021-04-03 DIAGNOSIS — I739 Peripheral vascular disease, unspecified: Secondary | ICD-10-CM | POA: Insufficient documentation

## 2021-04-03 DIAGNOSIS — G629 Polyneuropathy, unspecified: Secondary | ICD-10-CM

## 2021-04-16 ENCOUNTER — Telehealth: Payer: Self-pay | Admitting: Internal Medicine

## 2021-04-16 ENCOUNTER — Other Ambulatory Visit (HOSPITAL_COMMUNITY): Payer: Self-pay

## 2021-04-16 DIAGNOSIS — F1120 Opioid dependence, uncomplicated: Secondary | ICD-10-CM

## 2021-04-16 MED ORDER — BUPRENORPHINE HCL-NALOXONE HCL 8-2 MG SL SUBL
1.0000 | SUBLINGUAL_TABLET | Freq: Three times a day (TID) | SUBLINGUAL | 0 refills | Status: DC
  Filled 2021-04-16: qty 42, 14d supply, fill #0

## 2021-04-16 NOTE — Telephone Encounter (Signed)
Last refill - 03/20/21. Next appt scheduled 04/19/21 with PCP.

## 2021-04-16 NOTE — Telephone Encounter (Signed)
Refill Request   buprenorphine-naloxone (SUBOXONE) 8-2 mg SUBL SL tablet  [place] (Ph: [phone])   [place]  Name: Rolin, Schult MRN: [id number]  Date: 04/19/2021 Status: Sch  Time: 2:15 PM Length: 30  Visit Type: OPEN ESTABLISHED [726] Copay: $0.00  Provider: [name], MD

## 2021-04-18 ENCOUNTER — Other Ambulatory Visit (HOSPITAL_COMMUNITY): Payer: Self-pay

## 2021-04-19 ENCOUNTER — Ambulatory Visit (INDEPENDENT_AMBULATORY_CARE_PROVIDER_SITE_OTHER): Payer: Self-pay | Admitting: Internal Medicine

## 2021-04-19 ENCOUNTER — Other Ambulatory Visit (HOSPITAL_COMMUNITY): Payer: Self-pay

## 2021-04-19 VITALS — BP 143/86 | HR 72 | Temp 98.2°F | Ht 76.0 in | Wt 218.8 lb

## 2021-04-19 DIAGNOSIS — K029 Dental caries, unspecified: Secondary | ICD-10-CM

## 2021-04-19 DIAGNOSIS — I10 Essential (primary) hypertension: Secondary | ICD-10-CM

## 2021-04-19 DIAGNOSIS — E782 Mixed hyperlipidemia: Secondary | ICD-10-CM

## 2021-04-19 DIAGNOSIS — Z0000 Encounter for general adult medical examination without abnormal findings: Secondary | ICD-10-CM

## 2021-04-19 DIAGNOSIS — R7303 Prediabetes: Secondary | ICD-10-CM

## 2021-04-19 MED ORDER — METOPROLOL TARTRATE 50 MG PO TABS
50.0000 mg | ORAL_TABLET | Freq: Two times a day (BID) | ORAL | 11 refills | Status: DC
  Filled 2021-04-19: qty 60, 30d supply, fill #0
  Filled 2021-05-21: qty 180, 90d supply, fill #0

## 2021-04-19 NOTE — Progress Notes (Signed)
Subjective:  CC: HTN  HPI:  [name] is a 56 y.o. male with a past medical history stated below and presents today for HTN. Please see problem based assessment and plan for additional details.  Past Medical History:  Diagnosis Date   Alcohol withdrawal ([place])    Allergy    Anxiety Dx 2014   Chronic hip pain (Right) 03/23/2018   Chronic low back pain (Secondary area of Pain) (Bilateral) (R>L) 12/28/2012   Chronic lower extremity pain (Bilateral) (R>L) 06/26/2016   Chronic musculoskeletal pain 04/15/2016   Chronic radicular low back pain (Third area of Pain) (Right) (to calf) 06/02/2014   Chronic sacroiliac joint pain (Bilateral) (R>L) 08/22/2016   Chronic sacroiliac joint pain (Left) 03/23/2018   Chronic shoulder pain (Right) 05/12/2014   [place] in March 2016 on right shoulder. 06/22/2014: [facility] [place]:  Partial full-thickness tear with minimal retraction of supraspinatus. Status post injection and referral to PT    COPD (chronic obstructive pulmonary disease) ([place])    Drug abuse ([place])    pt reports opioid dependence due to previous back surgeries   Dyspepsia    Elevated LFTs 08/15/2018   Enlarged prostate    ETOH abuse    GERD (gastroesophageal reflux disease) Dx 2003   Headache(784.0)    Hepatitis    Hyperalgesia (Lower extremities) 12/26/2016   Hyperlipidemia Dx 2000   Hypertension Dx 2011   Hypokalemia    Hyponatremia 08/15/2018   IBS (irritable bowel syndrome)    alternates constiaption/ diarrhea   Irregular heart beat    Long Q-T syndrome 01/23/2014   Mental disorder    Neuromuscular disorder ([place])    hands hurt    Neuropathy 06/07/2013   Opiate use 05/14/2016   Pain management contract broken 12/15/2017   Pain medication contract broken on 12/15/2017 and again around 05/17/2019.  The patient continues to be noncompliant with the medication rules and regulations.  He also continues to consume alcohol and to use more medication than prescribed.    Seizure due to alcohol  withdrawal ([place]) 01/23/2014   Per patient report   Shortness of breath    Withdrawal seizures ([place]) 2014   last seizure 2014 etoh with drawal    Current Outpatient Medications on File Prior to Visit  Medication Sig Dispense Refill   albuterol (VENTOLIN HFA) 108 (90 Base) MCG/ACT inhaler Inhale 1-2 puffs into the lungs every 6 (six) hours as needed for wheezing or shortness of breath. 8.5 g 1   amLODipine (NORVASC) 10 MG tablet Take 1 tablet by mouth daily. 30 tablet 11   aspirin EC 81 MG tablet Take 81 mg by mouth daily. Swallow whole.     buprenorphine-naloxone (SUBOXONE) 8-2 mg SUBL SL tablet Dissolve 1 tablet under the tongue 3 times daily. 42 tablet 0   dicyclomine (BENTYL) 10 MG capsule TAKE 2 CAPSULES BY MOUTH EVERY 8 HOURS AS NEEDED FOR SPASMS (Patient taking differently: Take 20 mg by mouth every 8 (eight) hours as needed for spasms.) 90 capsule 2   folic acid (FOLVITE) 1 MG tablet Take 1 tablet (1 mg total) by mouth daily.     gabapentin (NEURONTIN) 100 MG capsule Take 2 capsules by mouth in the morning and take 2 capsules daily with lunch and 1 capsule  every evening. 150 capsule 2   isosorbide mononitrate (IMDUR) 30 MG 24 hr tablet Take 1 tablet by mouth daily. 30 tablet 1   Multiple Vitamin (MULTIVITAMIN WITH MINERALS) TABS tablet Take 1 tablet  by mouth daily.     rosuvastatin (CRESTOR) 20 MG tablet Take 1 tablet by mouth daily. 30 tablet 11   senna-docusate (SENOKOT-S) 8.6-50 MG tablet Take 1 tablet by mouth 2 (two) times daily.     tamsulosin (FLOMAX) 0.4 MG CAPS capsule Take 1 capsule  by mouth daily. 30 capsule 1   thiamine 100 MG tablet Take 1 tablet (100 mg total) by mouth daily.     [DISCONTINUED] pantoprazole (PROTONIX) 20 MG tablet Take 1 tablet (20 mg total) by mouth daily. 30 tablet 1   No current facility-administered medications on file prior to visit.    Family History  Problem Relation Age of Onset   Lung cancer Father        was a smoker   Alcohol abuse  Father    Hypertension Mother    Colon polyps Mother    Breast cancer Paternal Grandmother    Alcohol abuse Paternal Grandfather    Colon cancer Neg Hx    Rectal cancer Neg Hx    Stomach cancer Neg Hx     Social History   Socioeconomic History   Marital status: Divorced    Spouse name: Not on file   Number of children: 2   Years of education: Not on file   Highest education level: Not on file  Occupational History   Occupation: Unemployed  Tobacco Use   Smoking status: Every Day    Packs/day: 1.00    Years: 30.00    Pack years: 30.00    Types: Cigarettes   Smokeless tobacco: Never   Tobacco comments:    .5 PPD- 1ppd   Vaping Use   Vaping Use: Never used  Substance and Sexual Activity   Alcohol use: Yes    Comment: Drinks beer.   Drug use: No    Comment: Opana   Sexual activity: Not Currently  Other Topics Concern   Not on file  Social History Narrative   Pt lives with mother.  Not unemployed due to chronic pain, but does not receive disability.  Not followed by an outpatient provider.   Social Determinants of Health   Financial Resource Strain: Not on file  Food Insecurity: Not on file  Transportation Needs: Not on file  Physical Activity: Not on file  Stress: Not on file  Social Connections: Not on file  Intimate Partner Violence: Not on file    Review of Systems: ROS negative except for what is noted on the assessment and plan.  Objective:   Vitals:   04/19/21 1414  BP: (!) 143/86  Pulse: 72  Temp: 98.2 F (36.8 C)  TempSrc: Oral  SpO2: 96%  Weight: 218 lb 12.8 oz (99.2 kg)  Height: 6\' 4"  (1.93 m)    Physical Exam: Gen: A&O x3 and in no apparent distress, well appearing and nourished. HEENT:    Head - normocephalic, atraumatic.    Eye - visual acuity grossly intact, conjunctiva clear, sclera non-icteric, EOM intact.    Mouth - significant dental caries without erythema, edema or exudate CV: RRR, no murmurs, S1/S2 presents  Resp: Clear to  ascultation bilaterally  MSK: Grossly normal AROM and strength x4 extremities. Skin: good skin turgor, no rashes, unusual bruising, or prominent lesions.  Neuro: No focal deficits, grossly normal sensation and coordination.  Psych: Oriented x3 and responding appropriately. Intact memory, normal mood, judgement, affect, and insight.    Assessment & Plan:  See Encounters Tab for problem based charting.  Patient discussed with Dr.  Bertell Maria, D.O. [facility]   PGY-3 Pager: [phone]   Phone: [phone] Date 04/21/2021   Time 7:42 AM

## 2021-04-19 NOTE — Patient Instructions (Signed)
Thank you, [name] for allowing [place] to provide your care today. Today we discussed Blood pressure.    Labs/Tests Ordered: Lab Orders  No laboratory test(s) ordered today     Referrals Ordered:  Referral Orders  No referral(s) requested today     Medication Changes:  Medications Discontinued During This Encounter  Medication Reason   metoprolol tartrate (LOPRESSOR) 25 MG tablet      Meds ordered this encounter  Medications   metoprolol tartrate (LOPRESSOR) 50 MG tablet    Sig: Take 1 tablet (50 mg total) by mouth 2 (two) times daily.    Dispense:  60 tablet    Refill:  11     Health Maintenance Screening: There are no preventive care reminders to display for this patient.   Instructions:   Follow up:  1 month    Remember: If you have any questions or concerns, call our clinic at [phone] or after hours call [phone] and ask for the internal medicine resident on call.  Mark [name], D.O. [place]

## 2021-04-21 ENCOUNTER — Encounter: Payer: Self-pay | Admitting: Internal Medicine

## 2021-04-21 DIAGNOSIS — K029 Dental caries, unspecified: Secondary | ICD-10-CM | POA: Insufficient documentation

## 2021-04-21 NOTE — Assessment & Plan Note (Signed)
Patient informed that he will need a colonoscopy or an FOBT this year.

## 2021-04-21 NOTE — Assessment & Plan Note (Signed)
°  HPI: Patient presents for further evaluation and management of LUTS. Patient has a history of LUTS with a normal PSA and improvement of symptoms with flomax. I don't see an [place] documenting enlarged prostate. I have performed a rectal exam at a previous appointment, which was inconclusive for prostates enlargement.    Assessment/Plan: BPH - Continue flomax

## 2021-04-21 NOTE — Assessment & Plan Note (Signed)
HPI: Patient presents for further evaluation and management of hyperlipidemia. Patient was recently started on Crestor. He states that he is taking the medication as prescribed and tolerating it well.   Lipid Panel     Component Value Date/Time   CHOL 230 (H) 02/22/2021 1552   TRIG 262 (H) 02/22/2021 1552   HDL 37 (L) 02/22/2021 1552   CHOLHDL 6.2 (H) 02/22/2021 1552   LDLCALC 145 (H) 02/22/2021 1552   LABVLDL 48 (H) 02/22/2021 1552   The 10-year ASCVD risk score (Arnett DK, et al., 2019) is: 21.6%   Values used to calculate the score:     Age: 56 years     Sex: Male     Is Non-Hispanic African American: No     Diabetic: No     Tobacco smoker: Yes     Systolic Blood Pressure: 967 mmHg     Is BP treated: Yes     HDL Cholesterol: 37 mg/dL     Total Cholesterol: 230 mg/dL   Assessment/Plan: Hyperlipidemia: - Currently taking ASA 81 mg and Crestor 20 mg - Will need repeat lipid panel in 4-6 months.

## 2021-04-21 NOTE — Assessment & Plan Note (Signed)
HPI: Patient presents for further evaluation and management of poor dentition. Patient states that he has been having progression of cracks and hole sin his teeth. He denies any pain, swelling, or drainage. He denies any pain with eating or drinking.     Assessment/Plan: Dental Caries - Sent referral for dentist as he has the orange card.

## 2021-04-21 NOTE — Assessment & Plan Note (Addendum)
Patient informed that he will need a repeat A1c this year.  Hemoglobin A1C Latest Ref Rng & Units 01/14/2021 07/05/2020  HGBA1C 4.8 - 5.6 % 6.0(H) 5.5  Some recent data might be hidden

## 2021-04-21 NOTE — Assessment & Plan Note (Signed)
HPI: Patient presents for further evaluation and management of HTN. Patient was recently started on Imdur 30 mg at his last appointment. He states that he is tolerating his blood pressure medications well without side effects. He states that he is taking his medications consistently but blood pressures remain elevated.   His current BP medications include: 1. Metoprolol tartrate 25 mg BID 2. Amlodipine 10 mg qd 3. Imdur 30 mg qd  Recent blood pressures: BP Readings from Last 3 Encounters:  04/19/21 (!) 143/86  04/03/21 131/83  03/20/21 (!) 158/89    Assessment/Plan: Uncontrolled Essential HTN - patient has tried ACE (lisinopril) with angioedema, hyponatremia on HCTZ will currently using ETOH, a single episode of mild hyperkalemia with spironolactone. He is currently on an alpha blocker for his BPH, limiting use of clonidine. Patient has a history of Long QT syndrome and increased PVC and was started on metoprolol tartrate with improvement. Last EKG was in 01/2021 with normal QT and no PCV noted. He has had one episode of bradycardia but states that he has been tolerating his BB well without symptoms. Will increase his metoprolol today to see if his blood pressure is better controlled. This therapy may be limited his he developed bradycardia at his follow up. Alternatively, this patient could probably benefit from trying Losartan since it does not carry the same risk for angioedema. Additionally, I would consider a retrying spironolactone if he cannot tolerate his increased dose of metoprolol. Finally, we could consider switching him to finasteride for BPH and starting clonidine since he is already on a BB.  - Increase Metoprolol to 50 mg BID - Follow up in 1 month for BP check.

## 2021-04-27 ENCOUNTER — Other Ambulatory Visit (HOSPITAL_COMMUNITY): Payer: Self-pay

## 2021-04-27 NOTE — Progress Notes (Signed)
Internal Medicine Clinic Attending  Case discussed with Dr. Coe at the time of the visit.  We reviewed the resident's history and exam and pertinent patient test results.  I agree with the assessment, diagnosis, and plan of care documented in the resident's note.    

## 2021-04-30 ENCOUNTER — Other Ambulatory Visit (HOSPITAL_COMMUNITY): Payer: Self-pay

## 2021-04-30 ENCOUNTER — Other Ambulatory Visit: Payer: Self-pay | Admitting: Internal Medicine

## 2021-04-30 DIAGNOSIS — F1120 Opioid dependence, uncomplicated: Secondary | ICD-10-CM

## 2021-05-01 ENCOUNTER — Other Ambulatory Visit: Payer: Self-pay | Admitting: Internal Medicine

## 2021-05-01 ENCOUNTER — Other Ambulatory Visit (HOSPITAL_COMMUNITY): Payer: Self-pay

## 2021-05-01 DIAGNOSIS — F1120 Opioid dependence, uncomplicated: Secondary | ICD-10-CM

## 2021-05-01 NOTE — Telephone Encounter (Signed)
Patient requesting refill for suboxone. His last refill was on 04/16/21. He has been taking this prescription regular and has been in sustained remission. He has previously denies relapse or cravings. Will need to assess symptoms at next office visit. PDMP was checked today and was appropriate. I will refill his suboxone today for 14 days.   [name], D.O.  Internal Medicine Resident, PGY-3 [facility]  Pager: [phone] 8:49 PM, 05/01/2021   **Please contact the on call pager after 5 pm and on weekends at [phone].**

## 2021-05-01 NOTE — Telephone Encounter (Signed)
Last Toxssure 02/06/2021

## 2021-05-02 ENCOUNTER — Other Ambulatory Visit (HOSPITAL_COMMUNITY): Payer: Self-pay

## 2021-05-02 MED ORDER — BUPRENORPHINE HCL-NALOXONE HCL 8-2 MG SL SUBL
1.0000 | SUBLINGUAL_TABLET | Freq: Three times a day (TID) | SUBLINGUAL | 0 refills | Status: DC
  Filled 2021-05-02: qty 42, 14d supply, fill #0

## 2021-05-03 ENCOUNTER — Other Ambulatory Visit: Payer: Self-pay | Admitting: Student

## 2021-05-03 ENCOUNTER — Other Ambulatory Visit: Payer: Self-pay | Admitting: Internal Medicine

## 2021-05-03 ENCOUNTER — Other Ambulatory Visit (HOSPITAL_COMMUNITY): Payer: Self-pay

## 2021-05-03 DIAGNOSIS — F1120 Opioid dependence, uncomplicated: Secondary | ICD-10-CM

## 2021-05-03 DIAGNOSIS — F419 Anxiety disorder, unspecified: Secondary | ICD-10-CM

## 2021-05-03 MED ORDER — HYDROXYZINE HCL 25 MG PO TABS
25.0000 mg | ORAL_TABLET | Freq: Three times a day (TID) | ORAL | 0 refills | Status: DC | PRN
  Filled 2021-05-03 – 2021-05-20 (×2): qty 90, 30d supply, fill #0

## 2021-05-03 MED ORDER — BUPRENORPHINE HCL-NALOXONE HCL 8-2 MG SL SUBL
1.0000 | SUBLINGUAL_TABLET | Freq: Three times a day (TID) | SUBLINGUAL | 0 refills | Status: DC
  Filled 2021-05-03 – 2021-05-15 (×2): qty 42, 14d supply, fill #0

## 2021-05-04 ENCOUNTER — Other Ambulatory Visit (HOSPITAL_COMMUNITY): Payer: Self-pay

## 2021-05-14 ENCOUNTER — Other Ambulatory Visit (HOSPITAL_COMMUNITY): Payer: Self-pay

## 2021-05-15 ENCOUNTER — Other Ambulatory Visit (HOSPITAL_COMMUNITY): Payer: Self-pay

## 2021-05-17 ENCOUNTER — Other Ambulatory Visit: Payer: Self-pay

## 2021-05-17 ENCOUNTER — Encounter: Payer: Self-pay | Admitting: Internal Medicine

## 2021-05-17 ENCOUNTER — Ambulatory Visit (INDEPENDENT_AMBULATORY_CARE_PROVIDER_SITE_OTHER): Payer: Self-pay | Admitting: Internal Medicine

## 2021-05-17 VITALS — BP 154/89 | HR 73 | Temp 98.9°F | Wt 218.1 lb

## 2021-05-17 DIAGNOSIS — F1120 Opioid dependence, uncomplicated: Secondary | ICD-10-CM

## 2021-05-17 DIAGNOSIS — K029 Dental caries, unspecified: Secondary | ICD-10-CM

## 2021-05-17 DIAGNOSIS — I10 Essential (primary) hypertension: Secondary | ICD-10-CM

## 2021-05-17 NOTE — Progress Notes (Signed)
? ? ?Subjective:  ?CC: HTN ? ?HPI: ? ?Mark [name] is a 56 y.o. male with a past medical history stated below and presents today for HTN. Please see problem based assessment and plan for additional details. ? ?Past Medical History:  ?Diagnosis Date  ? Alcohol withdrawal ([place])   ? Allergy   ? Anxiety Dx 2014  ? Chronic hip pain (Right) 03/23/2018  ? Chronic low back pain (Secondary area of Pain) (Bilateral) (R>L) 12/28/2012  ? Chronic lower extremity pain (Bilateral) (R>L) 06/26/2016  ? Chronic musculoskeletal pain 04/15/2016  ? Chronic radicular low back pain (Third area of Pain) (Right) (to calf) 06/02/2014  ? Chronic sacroiliac joint pain (Bilateral) (R>L) 08/22/2016  ? Chronic sacroiliac joint pain (Left) 03/23/2018  ? Chronic shoulder pain (Right) 05/12/2014  ? [facility] in March 2016 on right shoulder. 06/22/2014: [facility] [place]:  Partial full-thickness tear with minimal retraction of supraspinatus. Status post injection and referral to PT   ? COPD (chronic obstructive pulmonary disease) ([place])   ? Drug abuse ([place])   ? pt reports opioid dependence due to previous back surgeries  ? Dyspepsia   ? Elevated LFTs 08/15/2018  ? Enlarged prostate   ? ETOH abuse   ? GERD (gastroesophageal reflux disease) Dx 2003  ? Headache(784.0)   ? Hepatitis   ? Hyperalgesia (Lower extremities) 12/26/2016  ? Hyperlipidemia Dx 2000  ? Hypertension Dx 2011  ? Hypokalemia   ? Hyponatremia 08/15/2018  ? IBS (irritable bowel syndrome)   ? alternates constiaption/ diarrhea  ? Irregular heart beat   ? 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 Mental disorder   ? Neuromuscular disorder ([place])   ? hands hurt   ? Neuropathy 06/07/2013  ? Opiate use 05/14/2016  ? Pain management contract broken 12/15/2017  ? Pain medication contract broken on 12/15/2017 and again around 05/17/2019.  The patient continues to be noncompliant with the medication rules and regulations.  He also continues to consume alcohol and to use more medication than prescribed.   ? Seizure due to alcohol  withdrawal ([place]) 01/23/2014  ? Per patient report  ? Shortness of breath   ? Withdrawal seizures ([place]) 2014  ? last seizure 2014 etoh with drawal  ? ? ?Current Outpatient Medications on File Prior to Visit  ?Medication Sig Dispense Refill  ? albuterol (VENTOLIN HFA) 108 (90 Base) MCG/ACT inhaler Inhale 1-2 puffs into the lungs every 6 (six) hours as needed for wheezing or shortness of breath. 8.5 g 1  ? amLODipine (NORVASC) 10 MG tablet Take 1 tablet by mouth daily. 30 tablet 11  ? aspirin EC 81 MG tablet Take 81 mg by mouth daily. Swallow whole.    ? buprenorphine-naloxone (SUBOXONE) 8-2 mg SUBL SL tablet Dissolve 1 tablet under the tongue 3 times daily. 42 tablet 0  ? dicyclomine (BENTYL) 10 MG capsule TAKE 2 CAPSULES BY MOUTH EVERY 8 HOURS AS NEEDED FOR SPASMS (Patient taking differently: Take 20 mg by mouth every 8 (eight) hours as needed for spasms.) 90 capsule 2  ? folic acid (FOLVITE) 1 MG tablet Take 1 tablet (1 mg total) by mouth daily.    ? gabapentin (NEURONTIN) 100 MG capsule Take 2 capsules by mouth in the morning and take 2 capsules daily with lunch and 1 capsule  every evening. 150 capsule 2  ? hydrOXYzine (ATARAX) 25 MG tablet Take 1 tablet by mouth 3 times daily as needed for itching, anxiety, nausea or vomiting. 90 tablet 0  ? isosorbide mononitrate (IMDUR)  30 MG 24 hr tablet Take 1 tablet by mouth daily. 30 tablet 1  ? metoprolol tartrate (LOPRESSOR) 50 MG tablet Take 1 tablet (50 mg total) by mouth 2 (two) times daily. 60 tablet 11  ? Multiple Vitamin (MULTIVITAMIN WITH MINERALS) TABS tablet Take 1 tablet by mouth daily.    ? rosuvastatin (CRESTOR) 20 MG tablet Take 1 tablet by mouth daily. 30 tablet 11  ? senna-docusate (SENOKOT-S) 8.6-50 MG tablet Take 1 tablet by mouth 2 (two) times daily.    ? tamsulosin (FLOMAX) 0.4 MG CAPS capsule Take 1 capsule  by mouth daily. 30 capsule 1  ? thiamine 100 MG tablet Take 1 tablet (100 mg total) by mouth daily.    ? [DISCONTINUED] pantoprazole (PROTONIX) 20  MG tablet Take 1 tablet (20 mg total) by mouth daily. 30 tablet 1  ? ?No current facility-administered medications on file prior to visit.  ? ? ?Family History  ?Problem Relation Age of Onset  ? Lung cancer Father   ?     was a smoker  ? Alcohol abuse Father   ? Hypertension Mother   ? Colon polyps Mother   ? Breast cancer Paternal Grandmother   ? Alcohol abuse Paternal Grandfather   ? Colon cancer Neg Hx   ? Rectal cancer Neg Hx   ? Stomach cancer Neg Hx   ? ? ?Social History  ? ?Socioeconomic History  ? Marital status: Divorced  ?  Spouse name: Not on file  ? Number of children: 2  ? Years of education: Not on file  ? Highest education level: Not on file  ?Occupational History  ? Occupation: Unemployed  ?Tobacco Use  ? Smoking status: Every Day  ?  Packs/day: 1.00  ?  Years: 30.00  ?  Pack years: 30.00  ?  Types: Cigarettes  ? Smokeless tobacco: Never  ? Tobacco comments:  ?  .5 PPD- 1ppd   ?Vaping Use  ? Vaping Use: Never used  ?Substance and Sexual Activity  ? Alcohol use: Yes  ?  Comment: Drinks beer.  ? Drug use: No  ?  Comment: Opana  ? Sexual activity: Not Currently  ?Other Topics Concern  ? Not on file  ?Social History Narrative  ? Pt lives with mother.  Not unemployed due to chronic pain, but does not receive disability.  Not followed by an outpatient provider.  ? ?Social Determinants of Health  ? ?Financial Resource Strain: Not on file  ?Food Insecurity: Not on file  ?Transportation Needs: Not on file  ?Physical Activity: Not on file  ?Stress: Not on file  ?Social Connections: Not on file  ?Intimate Partner Violence: Not on file  ? ? ?Review of Systems: ?ROS negative except for what is noted on the assessment and plan. ? ?Objective:  ? ?Vitals:  ? 05/17/21 1440 05/17/21 1441  ?BP:  (!) 154/89  ?Pulse:  73  ?Temp:  98.9 ?F (37.2 ?C)  ?TempSrc:  Oral  ?SpO2:  96%  ?Weight: 218 lb 1.6 oz (98.9 kg)   ? ? ?Physical Exam: ?Gen: A&O x3 and in no apparent distress, well appearing and nourished. ?HEENT:  ?   EOM  intact.  ?  Mouth - obvious caries and periodontal disease. ?Neck: no masses or nodules, AROM intact. ?CV: RRR, no murmurs, S1/S2 presents  ?Resp: Clear to ascultation bilaterally  ?Abd: BS (+) x4, soft, non-tender abdomen, without hepatosplenomegaly or masses ?MSK: Grossly normal AROM and strength x4 extremities. ?Neuro: No focal deficits, grossly  normal sensation and coordination.  ?Psych: Oriented x3 and responding appropriately. Intact memory, normal mood, judgement, affect, and insight.  ? ? ?Assessment & Plan:  ?See Encounters Tab for problem based charting. ? ?Patient discussed with Dr.  Cain Sieve ? ? ?[name], D.O. ?[facility]  PGY-3 ?Pager: [phone]  Phone: [phone] ?Date 05/21/2021  Time 2:20 PM  ?

## 2021-05-17 NOTE — Patient Instructions (Addendum)
Thank you, [name] for allowing [place] to provide your care today. Today we discussed blood pressure.   ? ?Labs/Tests Ordered: ?Lab Orders  ?No laboratory test(s) ordered today  ?  ? ?Referrals Ordered:  ?Referral Orders  ?No referral(s) requested today  ?  ? ?Medication Changes:  ?There are no discontinued medications.  ? ?No orders of the defined types were placed in this encounter. ?  ? ?Health Maintenance Screening: ?There are no preventive care reminders to display for this patient.  ? ?Instructions: please fill you blood pressure prescriptions. I will decrease you metoprolol to 25 mg twice daily.  ? ?Follow up:  first week of April.   ? ?Remember: If you have any questions or concerns, call our clinic at [phone] or after hours call [phone] and ask for the internal medicine resident on call. ? ?Mark [name], D.O. ?[place] ? ? ? ?

## 2021-05-20 ENCOUNTER — Other Ambulatory Visit: Payer: Self-pay | Admitting: Internal Medicine

## 2021-05-20 DIAGNOSIS — R109 Unspecified abdominal pain: Secondary | ICD-10-CM

## 2021-05-20 DIAGNOSIS — M792 Neuralgia and neuritis, unspecified: Secondary | ICD-10-CM

## 2021-05-21 ENCOUNTER — Other Ambulatory Visit (HOSPITAL_COMMUNITY): Payer: Self-pay

## 2021-05-21 MED ORDER — METOPROLOL TARTRATE 25 MG PO TABS
25.0000 mg | ORAL_TABLET | Freq: Two times a day (BID) | ORAL | 11 refills | Status: DC
  Filled 2021-05-21: qty 60, 30d supply, fill #0

## 2021-05-21 NOTE — Assessment & Plan Note (Addendum)
Patient schedule for dentist appointment on March 18th. Pt instructed to call the office if exhibits any signs of acute infection.  ?

## 2021-05-21 NOTE — Assessment & Plan Note (Signed)
Patient presents for further evaluation and management of his HTN. Patient was recently increased to metoprolol 50 mg BID. States that he was having some side effects including fatigue.  ? ?His BP today is: 154/89 ? ?On further review of his medication, it appears that he has not been filling any of his BP medications. I counseled him on the importance of taking his HTN medications.  ? ?Plan: ?- Decrease metoprolol to 25 mg BID ?-  Cont the remainder of his HTN medications.  ?- Instructed to keep a log of his BP at home and bring pill bottles in at subsequent visit.  ?

## 2021-05-21 NOTE — Assessment & Plan Note (Signed)
Patient has a history of OUD on suboxone. Patient is well controlled on current dose. He denies any cravings or relapses. PDMP is appropriate. UDS will be performed today.  ?

## 2021-05-23 ENCOUNTER — Other Ambulatory Visit (HOSPITAL_COMMUNITY): Payer: Self-pay

## 2021-05-23 LAB — TOXASSURE SELECT,+ANTIDEPR,UR

## 2021-05-25 ENCOUNTER — Other Ambulatory Visit (HOSPITAL_COMMUNITY): Payer: Self-pay

## 2021-05-25 MED ORDER — DICYCLOMINE HCL 10 MG PO CAPS
ORAL_CAPSULE | ORAL | 2 refills | Status: AC
  Filled 2021-05-25: qty 90, 15d supply, fill #0

## 2021-05-25 MED ORDER — GABAPENTIN 100 MG PO CAPS
ORAL_CAPSULE | ORAL | 2 refills | Status: AC
  Filled 2021-05-25: qty 150, 30d supply, fill #0
  Filled 2021-07-09: qty 150, 30d supply, fill #1
  Filled 2021-09-05: qty 150, 30d supply, fill #2

## 2021-05-29 ENCOUNTER — Other Ambulatory Visit: Payer: Self-pay | Admitting: Internal Medicine

## 2021-05-29 ENCOUNTER — Other Ambulatory Visit (HOSPITAL_COMMUNITY): Payer: Self-pay

## 2021-05-29 DIAGNOSIS — F1120 Opioid dependence, uncomplicated: Secondary | ICD-10-CM

## 2021-05-29 NOTE — Telephone Encounter (Signed)
Next appt scheduled 06/14/21 with PCP. ?

## 2021-05-30 ENCOUNTER — Other Ambulatory Visit (HOSPITAL_COMMUNITY): Payer: Self-pay

## 2021-05-31 ENCOUNTER — Other Ambulatory Visit (HOSPITAL_COMMUNITY): Payer: Self-pay

## 2021-05-31 ENCOUNTER — Other Ambulatory Visit: Payer: Self-pay | Admitting: Internal Medicine

## 2021-05-31 DIAGNOSIS — F1120 Opioid dependence, uncomplicated: Secondary | ICD-10-CM

## 2021-05-31 MED ORDER — BUPRENORPHINE HCL-NALOXONE HCL 8-2 MG SL SUBL
1.0000 | SUBLINGUAL_TABLET | Freq: Three times a day (TID) | SUBLINGUAL | 1 refills | Status: DC
  Filled 2021-05-31: qty 42, 14d supply, fill #0
  Filled 2021-06-12: qty 42, 14d supply, fill #1

## 2021-05-31 NOTE — Telephone Encounter (Signed)
Refill Request- The pt states he called on 05/29/2021 for his medication and now he is completely out.  Pt requesting a call back. ? ? ? ?buprenorphine-naloxone (SUBOXONE) 8-2 mg SUBL SL tablet ? ?[facility] (Ph: [phone]) ?

## 2021-06-11 ENCOUNTER — Other Ambulatory Visit (HOSPITAL_COMMUNITY): Payer: Self-pay

## 2021-06-12 ENCOUNTER — Other Ambulatory Visit (HOSPITAL_COMMUNITY): Payer: Self-pay

## 2021-06-14 ENCOUNTER — Ambulatory Visit (INDEPENDENT_AMBULATORY_CARE_PROVIDER_SITE_OTHER): Payer: Self-pay | Admitting: Internal Medicine

## 2021-06-14 ENCOUNTER — Other Ambulatory Visit (HOSPITAL_COMMUNITY): Payer: Self-pay

## 2021-06-14 VITALS — BP 144/80 | HR 60 | Wt 217.8 lb

## 2021-06-14 DIAGNOSIS — E782 Mixed hyperlipidemia: Secondary | ICD-10-CM

## 2021-06-14 DIAGNOSIS — F17200 Nicotine dependence, unspecified, uncomplicated: Secondary | ICD-10-CM

## 2021-06-14 DIAGNOSIS — I10 Essential (primary) hypertension: Secondary | ICD-10-CM

## 2021-06-14 DIAGNOSIS — I739 Peripheral vascular disease, unspecified: Secondary | ICD-10-CM

## 2021-06-14 DIAGNOSIS — K219 Gastro-esophageal reflux disease without esophagitis: Secondary | ICD-10-CM

## 2021-06-14 MED ORDER — ROSUVASTATIN CALCIUM 20 MG PO TABS
20.0000 mg | ORAL_TABLET | Freq: Every day | ORAL | 11 refills | Status: DC
Start: 2021-06-14 — End: 2021-08-23
  Filled 2021-06-14: qty 30, 30d supply, fill #0

## 2021-06-14 MED ORDER — ALBUTEROL SULFATE HFA 108 (90 BASE) MCG/ACT IN AERS
1.0000 | INHALATION_SPRAY | Freq: Four times a day (QID) | RESPIRATORY_TRACT | 1 refills | Status: AC | PRN
  Filled 2021-06-14: qty 8.5, 25d supply, fill #0

## 2021-06-14 MED ORDER — PANTOPRAZOLE SODIUM 20 MG PO TBEC
20.0000 mg | DELAYED_RELEASE_TABLET | Freq: Every day | ORAL | 1 refills | Status: DC
  Filled 2021-06-14: qty 30, 30d supply, fill #0
  Filled 2021-07-09: qty 30, 30d supply, fill #1

## 2021-06-14 MED ORDER — METOPROLOL TARTRATE 25 MG PO TABS
25.0000 mg | ORAL_TABLET | Freq: Two times a day (BID) | ORAL | 11 refills | Status: AC
Start: 2021-06-14 — End: 2022-06-14
  Filled 2021-06-14: qty 60, 30d supply, fill #0

## 2021-06-14 MED ORDER — AMLODIPINE BESYLATE 10 MG PO TABS
10.0000 mg | ORAL_TABLET | Freq: Every day | ORAL | 11 refills | Status: DC
  Filled 2021-06-14: qty 30, 30d supply, fill #0
  Filled 2021-07-09: qty 30, 30d supply, fill #1
  Filled 2021-08-21: qty 30, 30d supply, fill #2

## 2021-06-14 NOTE — Progress Notes (Signed)
? ? ? ?Subjective:  ?CC: HTN ? ?HPI: ? ?[name] is a 56 y.o. male with a past medical history stated below and presents today for HTN. Please see problem based assessment and plan for additional details. ? ?Past Medical History:  ?Diagnosis Date  ? Alcohol withdrawal ([place])   ? Allergy   ? Anxiety Dx 2014  ? Chronic hip pain (Right) 03/23/2018  ? Chronic low back pain (Secondary area of Pain) (Bilateral) (R>L) 12/28/2012  ? Chronic lower extremity pain (Bilateral) (R>L) 06/26/2016  ? Chronic musculoskeletal pain 04/15/2016  ? Chronic radicular low back pain (Third area of Pain) (Right) (to calf) 06/02/2014  ? Chronic sacroiliac joint pain (Bilateral) (R>L) 08/22/2016  ? Chronic sacroiliac joint pain (Left) 03/23/2018  ? Chronic shoulder pain (Right) 05/12/2014  ? [facility] in March 2016 on right shoulder. 06/22/2014: [facility] [place]:  Partial full-thickness tear with minimal retraction of supraspinatus. Status post injection and referral to PT   ? COPD (chronic obstructive pulmonary disease) ([place])   ? Drug abuse ([place])   ? pt reports opioid dependence due to previous back surgeries  ? Dyspepsia   ? Elevated LFTs 08/15/2018  ? Enlarged prostate   ? ETOH abuse   ? GERD (gastroesophageal reflux disease) Dx 2003  ? Headache(784.0)   ? Hepatitis   ? Hyperalgesia (Lower extremities) 12/26/2016  ? Hyperlipidemia Dx 2000  ? Hypertension Dx 2011  ? Hypokalemia   ? Hyponatremia 08/15/2018  ? IBS (irritable bowel syndrome)   ? alternates constiaption/ diarrhea  ? Irregular heart beat   ? 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 Mental disorder   ? Neuromuscular disorder ([place])   ? hands hurt   ? Neuropathy 06/07/2013  ? Opiate use 05/14/2016  ? Pain management contract broken 12/15/2017  ? Pain medication contract broken on 12/15/2017 and again around 05/17/2019.  The patient continues to be noncompliant with the medication rules and regulations.  He also continues to consume alcohol and to use more medication than prescribed.   ? Seizure due to alcohol  withdrawal ([place]) 01/23/2014  ? Per patient report  ? Shortness of breath   ? Withdrawal seizures ([place]) 2014  ? last seizure 2014 etoh with drawal  ? ? ?Current Outpatient Medications on File Prior to Visit  ?Medication Sig Dispense Refill  ? aspirin EC 81 MG tablet Take 81 mg by mouth daily. Swallow whole.    ? buprenorphine-naloxone (SUBOXONE) 8-2 mg SUBL SL tablet Dissolve 1 tablet under the tongue 3 times daily. 42 tablet 1  ? dicyclomine (BENTYL) 10 MG capsule TAKE 2 CAPSULES BY MOUTH EVERY 8 HOURS AS NEEDED FOR SPASMS 90 capsule 2  ? gabapentin (NEURONTIN) 100 MG capsule Take 2 capsules by mouth in the morning and take 2 capsules daily with lunch and 1 capsule  every evening. 150 capsule 2  ? hydrOXYzine (ATARAX) 25 MG tablet Take 1 tablet by mouth 3 times daily as needed for itching, anxiety, nausea or vomiting. 90 tablet 0  ? ?No current facility-administered medications on file prior to visit.  ? ? ?Family History  ?Problem Relation Age of Onset  ? Lung cancer Father   ?     was a smoker  ? Alcohol abuse Father   ? Hypertension Mother   ? Colon polyps Mother   ? Breast cancer Paternal Grandmother   ? Alcohol abuse Paternal Grandfather   ? Colon cancer Neg Hx   ? Rectal cancer Neg Hx   ? Stomach cancer  Neg Hx   ? ? ?Social History  ? ?Socioeconomic History  ? Marital status: Divorced  ?  Spouse name: Not on file  ? Number of children: 2  ? Years of education: Not on file  ? Highest education level: Not on file  ?Occupational History  ? Occupation: Unemployed  ?Tobacco Use  ? Smoking status: Every Day  ?  Packs/day: 1.00  ?  Years: 30.00  ?  Pack years: 30.00  ?  Types: Cigarettes  ? Smokeless tobacco: Never  ? Tobacco comments:  ?  .5 PPD- 1ppd   ?Vaping Use  ? Vaping Use: Never used  ?Substance and Sexual Activity  ? Alcohol use: Yes  ?  Comment: Drinks beer.  ? Drug use: No  ?  Comment: Opana  ? Sexual activity: Not Currently  ?Other Topics Concern  ? Not on file  ?Social History Narrative  ? Pt lives with  mother.  Not unemployed due to chronic pain, but does not receive disability.  Not followed by an outpatient provider.  ? ?Social Determinants of Health  ? ?Financial Resource Strain: Not on file  ?Food Insecurity: Not on file  ?Transportation Needs: Not on file  ?Physical Activity: Not on file  ?Stress: Not on file  ?Social Connections: Not on file  ?Intimate Partner Violence: Not on file  ? ? ?Review of Systems: ?ROS negative except for what is noted on the assessment and plan. ? ?Objective:  ? ?Vitals:  ? 06/14/21 1315 06/14/21 1347  ?BP: (!) 164/84 (!) 144/80  ?Pulse: 66 60  ?SpO2: 98%   ?Weight: 217 lb 12.8 oz (98.8 kg)   ? ? ?Physical Exam: ?Gen: A&O x3 and in no apparent distress, well appearing and nourished. ?Neck: no masses or nodules, AROM intact. ?CV: RRR, no murmurs, S1/S2 presents  ?Resp: Clear to ascultation bilaterally  ?Abd: BS (+) x4, mildly distended, non-tender abdomen, without hepatosplenomegaly or masses ?Skin: good skin turgor, no rashes, unusual bruising, or prominent lesions.  ?Neuro: No focal deficits, grossly normal sensation and coordination.  ?Psych: Oriented x3 and responding appropriately. Intact memory, normal mood, judgement, affect, and insight.  ? ? ?Assessment & Plan:  ?See Encounters Tab for problem based charting. ? ?Patient discussed with Dr. Jimmye Norman ? ? ?[name], D.O. ?[facility]  PGY-3 ?Pager: [phone]  Phone: [phone] ?Date 06/17/2021  Time 9:46 AM ? ?

## 2021-06-14 NOTE — Patient Instructions (Signed)
Thank you, [name] for allowing [place] to provide your care today. Today we discussed blood pressure, abdominal bloating, and cholesterol.   ? ?Labs/Tests Ordered: ?Lab Orders  ?No laboratory test(s) ordered today  ?  ? ?Referrals Ordered:  ?Referral Orders  ?No referral(s) requested today  ?  ? ?Medication Changes:  ? ?Meds ordered this encounter  ?Medications  ? amLODipine (NORVASC) 10 MG tablet  ?  Sig: Take 1 tablet by mouth daily.  ?  Dispense:  30 tablet  ?  Refill:  11  ? rosuvastatin (CRESTOR) 20 MG tablet  ?  Sig: Take 1 tablet by mouth daily.  ?  Dispense:  30 tablet  ?  Refill:  11  ? albuterol (VENTOLIN HFA) 108 (90 Base) MCG/ACT inhaler  ?  Sig: Inhale 1-2 puffs into the lungs every 6 (six) hours as needed for wheezing or shortness of breath.  ?  Dispense:  8.5 g  ?  Refill:  1  ? pantoprazole (PROTONIX) 20 MG tablet  ?  Sig: Take 1 tablet (20 mg total) by mouth daily.  ?  Dispense:  30 tablet  ?  Refill:  1  ?  IM program  ? metoprolol tartrate (LOPRESSOR) 25 MG tablet  ?  Sig: Take 1 tablet by mouth 2 times daily.  ?  Dispense:  60 tablet  ?  Refill:  11  ?  ? ?Health Maintenance Screening: ?There are no preventive care reminders to display for this patient.  ? ?Instructions:  ? ?Follow up:  2 weeks   ? ?Remember: If you have any questions or concerns, call our clinic at [phone] or after hours call [phone] and ask for the internal medicine resident on call. ? ?[name], D.O. ?[facility] ? ? ? ?

## 2021-06-15 ENCOUNTER — Other Ambulatory Visit (HOSPITAL_COMMUNITY): Payer: Self-pay

## 2021-06-17 ENCOUNTER — Encounter: Payer: Self-pay | Admitting: Internal Medicine

## 2021-06-17 NOTE — Assessment & Plan Note (Signed)
Refilled rosuvastatin

## 2021-06-17 NOTE — Assessment & Plan Note (Addendum)
Patients blood pressure remains uncontrolled. He has only been taking metoprolol 25 mg BID. Spent much of the time discussing the importance of controlling his blood pressure. He agrees to continue close follow up to titrate blood pressure medications. Patient does have a significantly elevated STOPBANG of 6, but does not have the financial means to get a sleep study.  ? ?Recent blood pressures: ? ?BP Readings from Last 3 Encounters:  ?06/14/21 (!) 144/80  ?05/17/21 (!) 154/89  ?04/19/21 (!) 143/86  ? ? ? ?Plan: ?- Cont metoprolol 25 mg BID ?- Start amlodipine 10 mg daily. ?- Follow up in 2 wks  ? ?

## 2021-06-17 NOTE — Assessment & Plan Note (Addendum)
Patient endorses dyspepsia, frequent belching, and occasionally nausea without constipation or diarrhea. He has a significant history of GERD and etoh use disorder. Symptoms could be secondary to ETOH induced gastroparesis verus GERD/gastritis. He would likely benefit from a Hpylori stool test but does not have medical insurance. He does have the [employer] and therefore may be able to get an EGD depending on response to PPI.  ? ?Plan: ?- 8 week trial of PPI ?- If refractory, then referral for EGD ?

## 2021-06-19 NOTE — Progress Notes (Signed)
Internal Medicine Clinic Attending  Case discussed with Dr. Coe  At the time of the visit.  We reviewed the resident's history and exam and pertinent patient test results.  I agree with the assessment, diagnosis, and plan of care documented in the resident's note.  

## 2021-06-25 ENCOUNTER — Other Ambulatory Visit (HOSPITAL_COMMUNITY): Payer: Self-pay

## 2021-06-25 ENCOUNTER — Other Ambulatory Visit: Payer: Self-pay | Admitting: Student in an Organized Health Care Education/Training Program

## 2021-06-25 DIAGNOSIS — F1120 Opioid dependence, uncomplicated: Secondary | ICD-10-CM

## 2021-06-26 ENCOUNTER — Other Ambulatory Visit: Payer: Self-pay | Admitting: Student in an Organized Health Care Education/Training Program

## 2021-06-26 ENCOUNTER — Other Ambulatory Visit (HOSPITAL_COMMUNITY): Payer: Self-pay

## 2021-06-26 DIAGNOSIS — F1120 Opioid dependence, uncomplicated: Secondary | ICD-10-CM

## 2021-06-27 ENCOUNTER — Other Ambulatory Visit (HOSPITAL_COMMUNITY): Payer: Self-pay

## 2021-06-28 ENCOUNTER — Other Ambulatory Visit (HOSPITAL_COMMUNITY): Payer: Self-pay

## 2021-06-28 ENCOUNTER — Ambulatory Visit (INDEPENDENT_AMBULATORY_CARE_PROVIDER_SITE_OTHER): Payer: Self-pay | Admitting: Internal Medicine

## 2021-06-28 ENCOUNTER — Other Ambulatory Visit: Payer: Self-pay

## 2021-06-28 ENCOUNTER — Encounter: Payer: Self-pay | Admitting: Internal Medicine

## 2021-06-28 VITALS — BP 142/81 | HR 77 | Temp 98.3°F | Ht 76.0 in | Wt 220.3 lb

## 2021-06-28 DIAGNOSIS — F1120 Opioid dependence, uncomplicated: Secondary | ICD-10-CM

## 2021-06-28 DIAGNOSIS — I10 Essential (primary) hypertension: Secondary | ICD-10-CM

## 2021-06-28 MED ORDER — BUPRENORPHINE HCL-NALOXONE HCL 8-2 MG SL SUBL
1.0000 | SUBLINGUAL_TABLET | Freq: Three times a day (TID) | SUBLINGUAL | 1 refills | Status: DC
  Filled 2021-06-28: qty 42, 14d supply, fill #0
  Filled 2021-07-09: qty 42, 14d supply, fill #1

## 2021-06-28 MED ORDER — HYDROCHLOROTHIAZIDE 12.5 MG PO TABS
12.5000 mg | ORAL_TABLET | Freq: Every day | ORAL | 2 refills | Status: DC
  Filled 2021-06-28 – 2021-06-29 (×2): qty 30, 30d supply, fill #0

## 2021-06-28 MED ORDER — HYDROCHLOROTHIAZIDE 12.5 MG PO TABS
12.5000 mg | ORAL_TABLET | Freq: Every day | ORAL | 2 refills | Status: DC
  Filled 2021-06-28: qty 30, 30d supply, fill #0

## 2021-06-28 NOTE — Progress Notes (Signed)
? ? ?Subjective:  ?CC: HTN ? ?HPI: ? ?[name] is a 56 y.o. male with a past medical history stated below and presents today for HTN. Please see problem based assessment and plan for additional details. ? ?Past Medical History:  ?Diagnosis Date  ? Alcohol withdrawal ([place])   ? Allergy   ? Anxiety Dx 2014  ? Chronic hip pain (Right) 03/23/2018  ? Chronic low back pain (Secondary area of Pain) (Bilateral) (R>L) 12/28/2012  ? Chronic lower extremity pain (Bilateral) (R>L) 06/26/2016  ? Chronic musculoskeletal pain 04/15/2016  ? Chronic radicular low back pain (Third area of Pain) (Right) (to calf) 06/02/2014  ? Chronic sacroiliac joint pain (Bilateral) (R>L) 08/22/2016  ? Chronic sacroiliac joint pain (Left) 03/23/2018  ? Chronic shoulder pain (Right) 05/12/2014  ? [facility] in March 2016 on right shoulder. 06/22/2014: [facility] [place]:  Partial full-thickness tear with minimal retraction of supraspinatus. Status post injection and referral to PT   ? COPD (chronic obstructive pulmonary disease) ([place])   ? Drug abuse ([place])   ? pt reports opioid dependence due to previous back surgeries  ? Dyspepsia   ? Elevated LFTs 08/15/2018  ? Enlarged prostate   ? ETOH abuse   ? GERD (gastroesophageal reflux disease) Dx 2003  ? Headache(784.0)   ? Hepatitis   ? Hyperalgesia (Lower extremities) 12/26/2016  ? Hyperlipidemia Dx 2000  ? Hypertension Dx 2011  ? Hypokalemia   ? Hyponatremia 08/15/2018  ? IBS (irritable bowel syndrome)   ? alternates constiaption/ diarrhea  ? Irregular heart beat   ? 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 Mental disorder   ? Neuromuscular disorder ([name])   ? hands hurt   ? Neuropathy 06/07/2013  ? Opiate use 05/14/2016  ? Pain management contract broken 12/15/2017  ? Pain medication contract broken on 12/15/2017 and again around 05/17/2019.  The patient continues to be noncompliant with the medication rules and regulations.  He also continues to consume alcohol and to use more medication than prescribed.   ? Seizure due to alcohol  withdrawal ([place]) 01/23/2014  ? Per patient report  ? Shortness of breath   ? Withdrawal seizures ([place]) 2014  ? last seizure 2014 etoh with drawal  ? ? ?Current Outpatient Medications on File Prior to Visit  ?Medication Sig Dispense Refill  ? albuterol (VENTOLIN HFA) 108 (90 Base) MCG/ACT inhaler Inhale 1-2 puffs into the lungs every 6 (six) hours as needed for wheezing or shortness of breath. 8.5 g 1  ? amLODipine (NORVASC) 10 MG tablet Take 1 tablet by mouth daily. 30 tablet 11  ? aspirin EC 81 MG tablet Take 81 mg by mouth daily. Swallow whole.    ? buprenorphine-naloxone (SUBOXONE) 8-2 mg SUBL SL tablet Dissolve 1 tablet under the tongue 3 times daily. 42 tablet 1  ? dicyclomine (BENTYL) 10 MG capsule TAKE 2 CAPSULES BY MOUTH EVERY 8 HOURS AS NEEDED FOR SPASMS 90 capsule 2  ? gabapentin (NEURONTIN) 100 MG capsule Take 2 capsules by mouth in the morning and take 2 capsules daily with lunch and 1 capsule  every evening. 150 capsule 2  ? metoprolol tartrate (LOPRESSOR) 25 MG tablet Take 1 tablet by mouth 2 times daily. 60 tablet 11  ? pantoprazole (PROTONIX) 20 MG tablet Take 1 tablet (20 mg total) by mouth daily. 30 tablet 1  ? rosuvastatin (CRESTOR) 20 MG tablet Take 1 tablet by mouth daily. 30 tablet 11  ? ?No current facility-administered medications on file prior to visit.  ? ? ?  Family History  ?Problem Relation Age of Onset  ? Lung cancer Father   ?     was a smoker  ? Alcohol abuse Father   ? Hypertension Mother   ? Colon polyps Mother   ? Breast cancer Paternal Grandmother   ? Alcohol abuse Paternal Grandfather   ? Colon cancer Neg Hx   ? Rectal cancer Neg Hx   ? Stomach cancer Neg Hx   ? ? ?Social History  ? ?Socioeconomic History  ? Marital status: Divorced  ?  Spouse name: Not on file  ? Number of children: 2  ? Years of education: Not on file  ? Highest education level: Not on file  ?Occupational History  ? Occupation: Unemployed  ?Tobacco Use  ? Smoking status: Every Day  ?  Packs/day: 1.00  ?  Years:  30.00  ?  Pack years: 30.00  ?  Types: Cigarettes  ? Smokeless tobacco: Never  ? Tobacco comments:  ?  .5 PPD- 1ppd   ?Vaping Use  ? Vaping Use: Never used  ?Substance and Sexual Activity  ? Alcohol use: Yes  ?  Comment: Drinks beer.  ? Drug use: No  ?  Comment: Opana  ? Sexual activity: Not Currently  ?Other Topics Concern  ? Not on file  ?Social History Narrative  ? Pt lives with mother.  Not unemployed due to chronic pain, but does not receive disability.  Not followed by an outpatient provider.  ? ?Social Determinants of Health  ? ?Financial Resource Strain: Not on file  ?Food Insecurity: Not on file  ?Transportation Needs: Not on file  ?Physical Activity: Not on file  ?Stress: Not on file  ?Social Connections: Not on file  ?Intimate Partner Violence: Not on file  ? ? ?Review of Systems: ?ROS negative except for what is noted on the assessment and plan. ? ?Objective:  ? ?Vitals:  ? 06/28/21 1420  ?BP: (!) 142/81  ?Pulse: 77  ?Temp: 98.3 ?F (36.8 ?C)  ?TempSrc: Oral  ?SpO2: 97%  ?Weight: 220 lb 4.8 oz (99.9 kg)  ?Height: '6\' 4"'$  (1.93 m)  ? ? ?Physical Exam: ?Gen: A&O x3 and in no apparent distress, well appearing and nourished. ?Neck: no masses or nodules, AROM intact. ?CV: RRR, no murmurs, S1/S2 presents  ?Resp: Clear to ascultation bilaterally  ?MSK: Grossly normal AROM and strength x4 extremities. ?Skin: good skin turgor, no rashes, unusual bruising, or prominent lesions.  ?Neuro: No focal deficits, grossly normal sensation and coordination.  ?Psych: Oriented x3 and responding appropriately. Intact memory, normal mood, judgement, affect, and insight.  ? ? ?Assessment & Plan:  ?See Encounters Tab for problem based charting. ? ?Patient discussed with Dr. Philipp Ovens ? ? ?[name], D.O. ?[facility]  PGY-3 ?Pager: [phone]  Phone: [phone] ?Date 06/29/2021  Time 7:18 AM ? ?

## 2021-06-28 NOTE — Patient Instructions (Signed)
Thank you, Mr.Indy Drury Ardizzone for allowing [place] to provide your care today. Today we discussed blood pressure and .   ? ?Labs/Tests Ordered: ?Lab Orders  ?No laboratory test(s) ordered today  ?  ? ?Referrals Ordered:  ?Referral Orders  ?No referral(s) requested today  ?  ? ?Medication Changes:  ?There are no discontinued medications.  ? ?Meds ordered this encounter  ?Medications  ? hydrochlorothiazide (HYDRODIURIL) 12.5 MG tablet  ?  Sig: Take 1 tablet (12.5 mg total) by mouth daily.  ?  Dispense:  30 tablet  ?  Refill:  2  ?  IM program  ?  ? ?Health Maintenance Screening: ?There are no preventive care reminders to display for this patient.  ? ?Instructions:  ? ?Follow up:  1 month    ? ?Remember: If you have any questions or concerns, call our clinic at [phone] or after hours call [phone] and ask for the internal medicine resident on call. ? ?[name], D.O. ?[place] ? ? ? ?

## 2021-06-29 ENCOUNTER — Encounter: Payer: Self-pay | Admitting: Internal Medicine

## 2021-06-29 ENCOUNTER — Other Ambulatory Visit (HOSPITAL_COMMUNITY): Payer: Self-pay

## 2021-06-29 NOTE — Assessment & Plan Note (Signed)
Patient presents for follow-up for hypertension.  Blood pressure seems better controlled at 142/81 on amlodipine 10 mg qd and metoprolol 25 mg twice daily.  He states that he is tolerating these medications well.  Patient previously has a history of taking hydrochlorothiazide with a mild dip in his sodium.  He does continue to drink alcohol which likely contributes to this low sodium.  However I think he will be able to tolerate it. ? ?A/p: ?Uncontrolled hypertension: ?-Continue amlodipine 10 mg daily and metoprolol 25 mg twice daily ?-Start hydrochlorothiazide 12.5 mg daily ?- 1 month f/u with bmp ? ?

## 2021-06-29 NOTE — Assessment & Plan Note (Signed)
Patient presents for opioid use disorder.  He has been on Suboxone for some time and tolerating well.  He denies any cravings or relapses.  His last UDS was appropriate with some evidence of alcohol use.  PDMP was checked and refill was sent. ?

## 2021-06-29 NOTE — Progress Notes (Signed)
Internal Medicine Clinic Attending  Case discussed with Dr. Coe  At the time of the visit.  We reviewed the resident's history and exam and pertinent patient test results.  I agree with the assessment, diagnosis, and plan of care documented in the resident's note.  

## 2021-07-09 ENCOUNTER — Other Ambulatory Visit (HOSPITAL_COMMUNITY): Payer: Self-pay

## 2021-07-09 ENCOUNTER — Other Ambulatory Visit: Payer: Self-pay | Admitting: Internal Medicine

## 2021-07-09 DIAGNOSIS — F419 Anxiety disorder, unspecified: Secondary | ICD-10-CM

## 2021-07-10 ENCOUNTER — Other Ambulatory Visit (HOSPITAL_COMMUNITY): Payer: Self-pay

## 2021-07-10 MED ORDER — HYDROXYZINE HCL 25 MG PO TABS
25.0000 mg | ORAL_TABLET | Freq: Three times a day (TID) | ORAL | 0 refills | Status: AC | PRN
  Filled 2021-07-10: qty 90, 30d supply, fill #0

## 2021-07-24 ENCOUNTER — Other Ambulatory Visit (HOSPITAL_COMMUNITY): Payer: Self-pay

## 2021-07-24 ENCOUNTER — Other Ambulatory Visit: Payer: Self-pay | Admitting: Internal Medicine

## 2021-07-24 DIAGNOSIS — F1120 Opioid dependence, uncomplicated: Secondary | ICD-10-CM

## 2021-07-24 MED ORDER — BUPRENORPHINE HCL-NALOXONE HCL 8-2 MG SL SUBL
1.0000 | SUBLINGUAL_TABLET | Freq: Three times a day (TID) | SUBLINGUAL | 1 refills | Status: DC
  Filled 2021-07-24: qty 42, 14d supply, fill #0
  Filled 2021-08-07: qty 42, 14d supply, fill #1

## 2021-07-25 ENCOUNTER — Other Ambulatory Visit (HOSPITAL_COMMUNITY): Payer: Self-pay

## 2021-07-26 ENCOUNTER — Other Ambulatory Visit (HOSPITAL_COMMUNITY): Payer: Self-pay

## 2021-07-26 ENCOUNTER — Encounter: Payer: Self-pay | Admitting: Internal Medicine

## 2021-07-26 ENCOUNTER — Other Ambulatory Visit: Payer: Self-pay

## 2021-07-26 ENCOUNTER — Ambulatory Visit (INDEPENDENT_AMBULATORY_CARE_PROVIDER_SITE_OTHER): Payer: Self-pay | Admitting: Internal Medicine

## 2021-07-26 VITALS — BP 142/85 | HR 75 | Temp 98.1°F | Ht 76.0 in | Wt 222.3 lb

## 2021-07-26 DIAGNOSIS — F1120 Opioid dependence, uncomplicated: Secondary | ICD-10-CM

## 2021-07-26 DIAGNOSIS — I10 Essential (primary) hypertension: Secondary | ICD-10-CM

## 2021-07-26 DIAGNOSIS — F17210 Nicotine dependence, cigarettes, uncomplicated: Secondary | ICD-10-CM

## 2021-07-26 MED ORDER — HYDROCHLOROTHIAZIDE 25 MG PO TABS
25.0000 mg | ORAL_TABLET | Freq: Every day | ORAL | 0 refills | Status: DC
  Filled 2021-07-26 – 2021-08-21 (×2): qty 90, 90d supply, fill #0

## 2021-07-26 NOTE — Progress Notes (Signed)
CC: BP Check, OUD  HPI:  [name] is a 56 y.o. person, with a PMH noted below, who presents to the clinic BP check and OUD. To see the management of their acute and chronic conditions, please see the A&P note under the Encounters tab.   Past Medical History:  Diagnosis Date   Alcohol withdrawal ([place])    Allergy    Anxiety Dx 2014   Chronic hip pain (Right) 03/23/2018   Chronic low back pain (Secondary area of Pain) (Bilateral) (R>L) 12/28/2012   Chronic lower extremity pain (Bilateral) (R>L) 06/26/2016   Chronic musculoskeletal pain 04/15/2016   Chronic radicular low back pain (Third area of Pain) (Right) (to calf) 06/02/2014   Chronic sacroiliac joint pain (Bilateral) (R>L) 08/22/2016   Chronic sacroiliac joint pain (Left) 03/23/2018   Chronic shoulder pain (Right) 05/12/2014   [place] in March 2016 on right shoulder. 06/22/2014: [facility] [place]:  Partial full-thickness tear with minimal retraction of supraspinatus. Status post injection and referral to PT    COPD (chronic obstructive pulmonary disease) ([place])    Drug abuse ([place])    pt reports opioid dependence due to previous back surgeries   Dyspepsia    Elevated LFTs 08/15/2018   Enlarged prostate    ETOH abuse    GERD (gastroesophageal reflux disease) Dx 2003   Headache(784.0)    Hepatitis    Hyperalgesia (Lower extremities) 12/26/2016   Hyperlipidemia Dx 2000   Hypertension Dx 2011   Hypokalemia    Hyponatremia 08/15/2018   IBS (irritable bowel syndrome)    alternates constiaption/ diarrhea   Irregular heart beat    Long Q-T syndrome 01/23/2014   Mental disorder    Neuromuscular disorder ([place])    hands hurt    Neuropathy 06/07/2013   Opiate use 05/14/2016   Pain management contract broken 12/15/2017   Pain medication contract broken on 12/15/2017 and again around 05/17/2019.  The patient continues to be noncompliant with the medication rules and regulations.  He also continues to consume alcohol and to use more medication than  prescribed.    Seizure due to alcohol withdrawal ([place]) 01/23/2014   Per patient report   Shortness of breath    Withdrawal seizures ([name]) 2014   last seizure 2014 etoh with drawal   Review of Systems:   Review of Systems  Constitutional:  Negative for chills, fever, malaise/fatigue and weight loss.  Cardiovascular:  Negative for chest pain, palpitations, orthopnea and claudication.  Gastrointestinal:  Negative for abdominal pain, diarrhea, nausea and vomiting.    Physical Exam:  Vitals:   07/26/21 1310 07/26/21 1317  BP: (!) 156/85 (!) 142/85  Pulse: 73 75  Temp: 98.1 F (36.7 C)   TempSrc: Oral   SpO2: 98%   Weight: 222 lb 4.8 oz (100.8 kg)   Height: '6\' 4"'$  (1.93 m)    Physical Exam Constitutional:      General: He is not in acute distress.    Appearance: Normal appearance. He is not ill-appearing, toxic-appearing or diaphoretic.  HENT:     Head: Normocephalic and atraumatic.  Cardiovascular:     Rate and Rhythm: Normal rate and regular rhythm.     Pulses: Normal pulses.     Heart sounds: Normal heart sounds. No murmur heard.   No friction rub. No gallop.  Pulmonary:     Effort: Pulmonary effort is normal.     Breath sounds: Normal breath sounds. No wheezing, rhonchi or rales.  Abdominal:     General:  Abdomen is flat. Bowel sounds are normal. There is no distension.     Palpations: Abdomen is soft.     Tenderness: There is no abdominal tenderness.  Neurological:     Mental Status: He is alert and oriented to person, place, and time.  Psychiatric:        Mood and Affect: Mood normal.        Behavior: Behavior normal.     Assessment & Plan:   See Encounters Tab for problem based charting.  Patient discussed with Dr. Jimmye Norman  No problem-specific Assessment & Plan notes found for this encounter.

## 2021-07-26 NOTE — Assessment & Plan Note (Signed)
    07/26/2021    1:17 PM 07/26/2021    1:10 PM 06/28/2021    2:20 PM  Vitals with BMI  Height  '6\' 4"'$  '6\' 4"'$   Weight  222 lbs 5 oz 220 lbs 5 oz  BMI  81.44 81.85  Systolic [phone]  Diastolic 85 85 81  Pulse 75 73 77   Patient presents with uncontrolled hypertension.  His current regimen is amlodipine 10 mg daily, metoprolol 25 mg twice daily, and hydrochlorothiazide 12.5 mg daily which was added at his last office visit.  Patient states that his pressures have been good.  Unfortunately, his electronic blood pressure cuff broke during the last time he was seen in clinic.  Repeat blood pressure today shows systolics in the 378H, with improvement to the 140s which is very similar to his presentation in April.  Discussed increasing his hydrochlorothiazide today.  Patient agreement. - Continue amlodipine 10 mg daily - Continue metoprolol 25 mg twice daily - Increase hydrochlorothiazide to 25 mg daily - BMP today - Follow-up in the office in 2 weeks

## 2021-07-26 NOTE — Patient Instructions (Addendum)
To Mr. Ferrari,   It was a pleasure seeing you today! Today we discussed your blood pressure, and Suboxone.   For your blood pressure, it is doing better, but I believe we have some room for continued improvement. I am going to increase your dose of hydrochlorothiazide today to 25 mg daily from the 12.5 mg daily. We will have you follow back with [place] in 2 weeks to see how you're blood pressure is doing. We will also check your kidney function today.   For your Suboxone, I am glad that you were able to pick it up on the way here. I am glad there have been no relapses or cravings. We will not get any urine today.   We will see you back in two weeks to check your blood pressure! Have a good day,  Maudie Mercury, MD

## 2021-07-26 NOTE — Assessment & Plan Note (Signed)
Patient presents for opioid use disorder follow-up.  He states that he is doing well on his Suboxone 8-2 3 times daily.  He picked up his medication that was prescribed on 07/24/2021 today on the way to clinic.  He has had no cravings or relapses, and is stable on his current dose. - Continue current regimen of Suboxone 8-2 mg tabs 3 times daily - Follow-up in 4 weeks - UDS next visit

## 2021-07-27 LAB — BMP8+ANION GAP
Anion Gap: 18 mmol/L (ref 10.0–18.0)
BUN/Creatinine Ratio: 7 — ABNORMAL LOW (ref 9–20)
BUN: 5 mg/dL — ABNORMAL LOW (ref 6–24)
CO2: 24 mmol/L (ref 20–29)
Calcium: 9.6 mg/dL (ref 8.7–10.2)
Chloride: 97 mmol/L (ref 96–106)
Creatinine, Ser: 0.72 mg/dL — ABNORMAL LOW (ref 0.76–1.27)
Glucose: 145 mg/dL — ABNORMAL HIGH (ref 70–99)
Potassium: 4.2 mmol/L (ref 3.5–5.2)
Sodium: 139 mmol/L (ref 134–144)
eGFR: 108 mL/min/{1.73_m2} (ref >59)

## 2021-07-31 NOTE — Progress Notes (Signed)
Internal Medicine Clinic Attending ? ?Case discussed with Dr. Winters  At the time of the visit.  We reviewed the resident?s history and exam and pertinent patient test results.  I agree with the assessment, diagnosis, and plan of care documented in the resident?s note.  ?

## 2021-08-04 ENCOUNTER — Other Ambulatory Visit (HOSPITAL_COMMUNITY): Payer: Self-pay

## 2021-08-07 ENCOUNTER — Other Ambulatory Visit (HOSPITAL_COMMUNITY): Payer: Self-pay

## 2021-08-09 ENCOUNTER — Other Ambulatory Visit: Payer: Self-pay

## 2021-08-09 ENCOUNTER — Encounter: Payer: Self-pay | Admitting: Student

## 2021-08-09 ENCOUNTER — Ambulatory Visit (INDEPENDENT_AMBULATORY_CARE_PROVIDER_SITE_OTHER): Payer: Self-pay | Admitting: Student

## 2021-08-09 VITALS — BP 121/77 | HR 79 | Temp 98.2°F | Ht 76.0 in | Wt 220.3 lb

## 2021-08-09 DIAGNOSIS — I10 Essential (primary) hypertension: Secondary | ICD-10-CM

## 2021-08-09 DIAGNOSIS — F17210 Nicotine dependence, cigarettes, uncomplicated: Secondary | ICD-10-CM

## 2021-08-09 DIAGNOSIS — F1120 Opioid dependence, uncomplicated: Secondary | ICD-10-CM

## 2021-08-09 NOTE — Progress Notes (Signed)
   CC: BP follow-up  HPI:  Mark [name] is a 56 y.o. male with PMH as below who presents to clinic today to follow-up on his blood pressure. Please see problem based charting for evaluation, assessment and plan.  Past Medical History:  Diagnosis Date   Alcohol withdrawal ([place])    Allergy    Anxiety Dx 2014   Chronic hip pain (Right) 03/23/2018   Chronic low back pain (Secondary area of Pain) (Bilateral) (R>L) 12/28/2012   Chronic lower extremity pain (Bilateral) (R>L) 06/26/2016   Chronic musculoskeletal pain 04/15/2016   Chronic radicular low back pain (Third area of Pain) (Right) (to calf) 06/02/2014   Chronic sacroiliac joint pain (Bilateral) (R>L) 08/22/2016   Chronic sacroiliac joint pain (Left) 03/23/2018   Chronic shoulder pain (Right) 05/12/2014   [address] in March 2016 on right shoulder. 06/22/2014: [facility] [place]:  Partial full-thickness tear with minimal retraction of supraspinatus. Status post injection and referral to PT    COPD (chronic obstructive pulmonary disease) ([place])    Drug abuse ([place])    pt reports opioid dependence due to previous back surgeries   Dyspepsia    Elevated LFTs 08/15/2018   Enlarged prostate    ETOH abuse    GERD (gastroesophageal reflux disease) Dx 2003   Headache(784.0)    Hepatitis    Hyperalgesia (Lower extremities) 12/26/2016   Hyperlipidemia Dx 2000   Hypertension Dx 2011   Hypokalemia    Hyponatremia 08/15/2018   IBS (irritable bowel syndrome)    alternates constiaption/ diarrhea   Irregular heart beat    Long Q-T syndrome 01/23/2014   Mental disorder    Neuromuscular disorder ([place])    hands hurt    Neuropathy 06/07/2013   Opiate use 05/14/2016   Pain management contract broken 12/15/2017   Pain medication contract broken on 12/15/2017 and again around 05/17/2019.  The patient continues to be noncompliant with the medication rules and regulations.  He also continues to consume alcohol and to use more medication than prescribed.    Seizure due to  alcohol withdrawal ([place]) 01/23/2014   Per patient report   Shortness of breath    Withdrawal seizures ([place]) 2014   last seizure 2014 etoh with drawal    Review of Systems:  Constitutional: Negative for weight changes fatigue Respiratory: Negative for shortness of breath Cardiac: Negative for chest pain Neuro: Negative for headache, dizziness or weakness  Physical Exam: General: Pleasant, well-appearing middle-age man.  No acute distress. Cardiac: RRR. No murmurs, rubs or gallops. No LE edema Respiratory: Lungs CTAB. No wheezing or crackles. Abdominal: Soft, symmetric and non tender. Normal BS. Skin: Warm, dry and intact without rashes or lesions Neuro: A&O x 3. Moves all extremities  Vitals:   08/09/21 1508 08/09/21 1514  BP: (!) 141/82 121/77  Pulse: 77 79  Temp: 98.2 F (36.8 C)   TempSrc: Oral   SpO2: 97%   Weight: 220 lb 4.8 oz (99.9 kg)   Height: '6\' 4"'$  (1.93 m)      Assessment & Plan:   See Encounters Tab for problem based charting.  Patient discussed with Dr.  Thomes Cake, MD, MPH

## 2021-08-09 NOTE — Patient Instructions (Addendum)
Thank you, Mr.Kayce Lakin Romer for allowing [place] to provide your care today. Today we discussed your blood pressure and your kidney function. They both looks good so continue taking your medications.    My Chart Access: [email or web address]  Please follow-up in 2 weeks for the OUD appointment  Please make sure to arrive 15 minutes prior to your next appointment. If you arrive late, you may be asked to reschedule.    We look forward to seeing you next time. Please call our clinic at [phone] if you have any questions or concerns. The best time to call is Monday-Friday from 9am-4pm, but there is someone available 24/7. If after hours or the weekend, call the main hospital number and ask for the Internal Medicine Resident On-Call. If you need medication refills, please notify your pharmacy one week in advance and they will send [place] a request.   Thank you for letting us take part in your care. Wishing you the best!  Lacinda Axon, MD 08/09/2021, 4:00 PM IM Resident, PGY-2 [facility] 41:10

## 2021-08-09 NOTE — Assessment & Plan Note (Signed)
Patient here for follow-up on his blood pressure after adjustment to BP regimen during last office visit.  Patient's BP now within goal with increased dose of HCTZ.  BMP showed normal kidney function and no electrolyte abnormalities. Vitals:   08/09/21 1508 08/09/21 1514  BP: (!) 141/82 121/77   Plan: -- Continue amlodipine 10 mg daily -- Continue metoprolol 25 mg twice daily -- Continue HCTZ 25 mg daily -- Follow-up in 2 weeks for OUD clinic

## 2021-08-10 NOTE — Progress Notes (Signed)
Internal Medicine Clinic Attending  Case discussed with Dr. Coy Saunas  At the time of the visit.  We reviewed the resident's history and exam and pertinent patient test results.  I agree with the assessment, diagnosis, and plan of care documented in the resident's note. Recheck BMP at f/u visit in two weeks to assess increase in hctz.

## 2021-08-19 ENCOUNTER — Other Ambulatory Visit: Payer: Self-pay | Admitting: Internal Medicine

## 2021-08-19 DIAGNOSIS — K219 Gastro-esophageal reflux disease without esophagitis: Secondary | ICD-10-CM

## 2021-08-21 ENCOUNTER — Other Ambulatory Visit (HOSPITAL_COMMUNITY): Payer: Self-pay

## 2021-08-21 ENCOUNTER — Other Ambulatory Visit: Payer: Self-pay | Admitting: Internal Medicine

## 2021-08-21 DIAGNOSIS — K219 Gastro-esophageal reflux disease without esophagitis: Secondary | ICD-10-CM

## 2021-08-21 NOTE — Telephone Encounter (Signed)
Next appt scheduled 6/15 with Dr Coy Saunas.

## 2021-08-23 ENCOUNTER — Other Ambulatory Visit (HOSPITAL_COMMUNITY): Payer: Self-pay

## 2021-08-23 ENCOUNTER — Encounter: Payer: Self-pay | Admitting: Student

## 2021-08-23 ENCOUNTER — Ambulatory Visit (INDEPENDENT_AMBULATORY_CARE_PROVIDER_SITE_OTHER): Payer: Self-pay | Admitting: Student

## 2021-08-23 ENCOUNTER — Other Ambulatory Visit: Payer: Self-pay

## 2021-08-23 VITALS — BP 145/89 | HR 79 | Temp 98.2°F | Resp 28 | Ht 76.0 in | Wt 217.0 lb

## 2021-08-23 DIAGNOSIS — E876 Hypokalemia: Secondary | ICD-10-CM

## 2021-08-23 DIAGNOSIS — R1013 Epigastric pain: Secondary | ICD-10-CM

## 2021-08-23 DIAGNOSIS — E782 Mixed hyperlipidemia: Secondary | ICD-10-CM

## 2021-08-23 DIAGNOSIS — Z0000 Encounter for general adult medical examination without abnormal findings: Secondary | ICD-10-CM

## 2021-08-23 DIAGNOSIS — F418 Other specified anxiety disorders: Secondary | ICD-10-CM

## 2021-08-23 DIAGNOSIS — F17210 Nicotine dependence, cigarettes, uncomplicated: Secondary | ICD-10-CM

## 2021-08-23 DIAGNOSIS — F1120 Opioid dependence, uncomplicated: Secondary | ICD-10-CM

## 2021-08-23 DIAGNOSIS — I739 Peripheral vascular disease, unspecified: Secondary | ICD-10-CM

## 2021-08-23 DIAGNOSIS — F1020 Alcohol dependence, uncomplicated: Secondary | ICD-10-CM

## 2021-08-23 DIAGNOSIS — K219 Gastro-esophageal reflux disease without esophagitis: Secondary | ICD-10-CM

## 2021-08-23 DIAGNOSIS — I10 Essential (primary) hypertension: Secondary | ICD-10-CM

## 2021-08-23 DIAGNOSIS — Z1211 Encounter for screening for malignant neoplasm of colon: Secondary | ICD-10-CM

## 2021-08-23 MED ORDER — AMLODIPINE BESYLATE 10 MG PO TABS
10.0000 mg | ORAL_TABLET | Freq: Every day | ORAL | 3 refills | Status: AC
  Filled 2021-08-23: qty 90, 90d supply, fill #0

## 2021-08-23 MED ORDER — BUPRENORPHINE HCL-NALOXONE HCL 8-2 MG SL SUBL
1.0000 | SUBLINGUAL_TABLET | Freq: Three times a day (TID) | SUBLINGUAL | 1 refills | Status: DC
  Filled 2021-08-23: qty 42, 14d supply, fill #0
  Filled 2021-09-05: qty 42, 14d supply, fill #1

## 2021-08-23 MED ORDER — ROSUVASTATIN CALCIUM 20 MG PO TABS
20.0000 mg | ORAL_TABLET | Freq: Every day | ORAL | 3 refills | Status: DC
  Filled 2021-08-23: qty 90, 90d supply, fill #0

## 2021-08-23 MED ORDER — PANTOPRAZOLE SODIUM 20 MG PO TBEC
20.0000 mg | DELAYED_RELEASE_TABLET | Freq: Every day | ORAL | 2 refills | Status: AC
  Filled 2021-08-23: qty 60, 60d supply, fill #0

## 2021-08-23 MED ORDER — ONDANSETRON HCL 4 MG PO TABS
4.0000 mg | ORAL_TABLET | Freq: Three times a day (TID) | ORAL | 1 refills | Status: AC | PRN
  Filled 2021-08-23: qty 30, 10d supply, fill #0

## 2021-08-23 NOTE — Patient Instructions (Signed)
Thank you, Mark [name] for allowing [place] to provide your care today. Today we discussed your blood pressure and Suboxone use.  Your blood pressure is still elevated, please check your blood pressure at home and bring the log with you at your next office visit.  Please follow-up with your dentist to get your dental work.  I have ordered the following labs for you:   Lab Orders         BMP8+Anion Gap      I will call if any are abnormal. All of your labs can be accessed through "My Chart".  I have place a referrals to GI for colonoscopy  I have ordered the following medication/changed the following medications:  Zofran 4 mg as needed for nausea or vomiting  My Chart Access: [email or web address]  Please follow-up in early next week for BMP check and 4 weeks for OUD follow-up and BP check  Please make sure to arrive 15 minutes prior to your next appointment. If you arrive late, you may be asked to reschedule.    We look forward to seeing you next time. Please call our clinic at [phone] if you have any questions or concerns. The best time to call is Monday-Friday from 9am-4pm, but there is someone available 24/7. If after hours or the weekend, call the main hospital number and ask for the Internal Medicine Resident On-Call. If you need medication refills, please notify your pharmacy one week in advance and they will send [place] a request.   Thank you for letting us take part in your care. Wishing you the best!  Lacinda Axon, MD 08/23/2021, 1:59 PM IM Resident, PGY-2 Mark [name] 41:10

## 2021-08-23 NOTE — Progress Notes (Signed)
   CC: OUD follow-up  HPI:  [name] is a 56 y.o. male with PMH as below who presents to clinic to follow-up on his chronic medical problems and OUD. Please see problem based charting for evaluation, assessment and plan.  Past Medical History:  Diagnosis Date   Alcohol withdrawal ([place])    Allergy    Anxiety Dx 2014   Chronic hip pain (Right) 03/23/2018   Chronic low back pain (Secondary area of Pain) (Bilateral) (R>L) 12/28/2012   Chronic lower extremity pain (Bilateral) (R>L) 06/26/2016   Chronic musculoskeletal pain 04/15/2016   Chronic radicular low back pain (Third area of Pain) (Right) (to calf) 06/02/2014   Chronic sacroiliac joint pain (Bilateral) (R>L) 08/22/2016   Chronic sacroiliac joint pain (Left) 03/23/2018   Chronic shoulder pain (Right) 05/12/2014   [place] [address] in March 2016 on right shoulder. 06/22/2014: [facility] [place]:  Partial full-thickness tear with minimal retraction of supraspinatus. 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  The patient continues to be noncompliant with the medication rules and regulations.  He also continues to consume alcohol and to use more medication than prescribed.     Seizure due to alcohol withdrawal ([place]) 01/23/2014   Per patient report   Shortness of breath    Withdrawal seizures ([place]) 2014   last seizure 2014 etoh with drawal    Review of Systems:  Constitutional: Negative for fever or fatigue HEENT: Positive for occasional dry mouth Respiratory: Negative for shortness of breath Cardiac: Negative for chest pain MSK: Negative for back pain Abdomen: Positive for occasional nausea. Negative for abdominal pain, constipation or diarrhea Neuro: Negative for headache or weakness  Physical Exam: General: Pleasant, well-appearing middle-age man. No acute distress. HEENT: MMM. Evidence of dental caries in lower molars Cardiac: RRR. No murmurs, rubs or gallops. No LE edema Respiratory: Lungs CTAB. No wheezing or crackles. Abdominal: Soft, symmetric and non tender. Normal BS. Skin: Warm, dry and intact without rashes or lesions Extremities: Atraumatic. Full ROM. Palpable radial and DP pulses. Neuro: A&O x 3. Moves all extremities. Normal sensation to gross touch Psych: Appropriate mood and affect.  Vitals:   08/23/21 1308 08/23/21 1352  BP: (!) 146/82 (!) 145/89  Pulse: 82 79  Resp: (!) 28   Temp: 98.2 F (36.8 C)   TempSrc: Oral   SpO2: 96%   Weight: 217 lb (98.4 kg)   Height: '6\' 4"'$  (1.93 m)     Assessment & Plan:   See Encounters Tab for problem based charting.  Patient discussed with Dr.  Dani Gobble, MD, MPH

## 2021-08-24 ENCOUNTER — Other Ambulatory Visit (HOSPITAL_COMMUNITY): Payer: Self-pay

## 2021-08-24 LAB — BMP8+ANION GAP
Anion Gap: 19 mmol/L — ABNORMAL HIGH (ref 10.0–18.0)
BUN/Creatinine Ratio: 8 — ABNORMAL LOW (ref 9–20)
BUN: 5 mg/dL — ABNORMAL LOW (ref 6–24)
CO2: 28 mmol/L (ref 20–29)
Calcium: 10.1 mg/dL (ref 8.7–10.2)
Chloride: 85 mmol/L — ABNORMAL LOW (ref 96–106)
Creatinine, Ser: 0.6 mg/dL — ABNORMAL LOW (ref 0.76–1.27)
Glucose: 143 mg/dL — ABNORMAL HIGH (ref 70–99)
Potassium: 3.1 mmol/L — ABNORMAL LOW (ref 3.5–5.2)
Sodium: 132 mmol/L — ABNORMAL LOW (ref 134–144)
eGFR: 114 mL/min/{1.73_m2} (ref >59)

## 2021-08-25 ENCOUNTER — Other Ambulatory Visit (HOSPITAL_COMMUNITY): Payer: Self-pay

## 2021-08-25 ENCOUNTER — Encounter: Payer: Self-pay | Admitting: Student

## 2021-08-25 MED ORDER — POTASSIUM CHLORIDE CRYS ER 20 MEQ PO TBCR
20.0000 meq | EXTENDED_RELEASE_TABLET | Freq: Two times a day (BID) | ORAL | 0 refills | Status: DC
  Filled 2021-08-25: qty 4, 2d supply, fill #0

## 2021-08-25 NOTE — Assessment & Plan Note (Signed)
Patient states he has been cutting back on his alcohol use. He is down to about 1-2 beers per day.  Patient has refused alcohol cessation resources multiple times and states that he will work to quit completely on his own but due to his past withdrawal symptoms he is doing this slowly. -Continue to reassess at each office visit

## 2021-08-25 NOTE — Assessment & Plan Note (Signed)
GI referral for colonoscopy placed today

## 2021-08-25 NOTE — Assessment & Plan Note (Signed)
Patient currently on HCTZ found to have potassium of 3.1 on BMP.  Plan to replete potassium and initiate work-up for possible hyperaldosteronism.  Plan: -Start KCl 20 mEq BID x 2 days, advised to take 10 mEq of OTC potassium gluconate daily over the weekend and eat 1-2 bananas until he can pick up KCl on Monday. -Follow-up BMP next week, start work-up for hyperaldosteronism

## 2021-08-25 NOTE — Assessment & Plan Note (Signed)
PHQ-9 remains at 0. However patient states he has been frustrated with the government. 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 He is applied for disability 2 times and has been rejected both times. 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  He now has a granddaughter at home whom he takes care of. He is at least appreciated of the time he has now to be able to be there for his granddaughter and make up for the time he lost with his kids when he was constantly working. -Consider referral to community care coordinator/social worker to assist if patient agreeable at next office visit.

## 2021-08-25 NOTE — Assessment & Plan Note (Signed)
Last lipid panel December 2022 with LDL 145.   -Refilled Crestor 20 mg daily -Repeat lipid panel at next office visit

## 2021-08-25 NOTE — Assessment & Plan Note (Addendum)
Patient with a history of hypertension on multiple blood pressure medications found to have SBP in the 140s today. Blood pressure still not at goal even with increased dose of HCTZ to 25 mg daily. BMP showed potassium down to 3.1. Patient still with elevated blood pressure while on 3 blood pressure medications including a thiazide. Chart review also shows patient has had a history of hypokalemia as low as 2.7 eight months ago.  Patient will need lab work to rule out hyperaldosteronism.  Patient informed of lab results and low potassium.  Discussed plan to send him some potassium pills however patient states he would not be able to pick up the prescription until Monday. States he has a bottle of OTC potassium gluconate 550 mg (90 mg of elemental K+) which is equal 2.5 mEq of K+, supplements at home.   Plan: -Continue amlodipine, metoprolol and HCTZ -Start KCl 20 mEq BID x 2 days, advised to take 10 mEq of potassium gluconate daily over the weekend and eat 1-2 bananas until he can pick up KCl on Monday. -Follow-up BMP next week, start work-up for hyperaldosteronism

## 2021-08-25 NOTE — Assessment & Plan Note (Signed)
Patient here for opioid use follow-up. He denies any cravings or withdrawal symptoms on Suboxone 8-2 3 times daily. He has been consistently picking up his prescriptions every 2 weeks. He denies any other illicit drug use. Recent UDS has been as expected. He reports dry mouth mostly at night has some dental caries on exam. States he has been referred to a dentist and plans to follow-up.  Plan: -Refilled Suboxone 8-2 mg tablets, 3 times daily -Follow-up in 4 weeks for repeat UDS,

## 2021-08-25 NOTE — Assessment & Plan Note (Signed)
Stable and chronic. -Refilled Protonix 20 mg daily

## 2021-08-27 ENCOUNTER — Other Ambulatory Visit (HOSPITAL_COMMUNITY): Payer: Self-pay

## 2021-08-28 NOTE — Progress Notes (Signed)
Internal Medicine Clinic Attending  Case discussed with Dr. [place]  At the time of the visit.  We reviewed the resident's history and exam and pertinent patient test results.  I agree with the assessment, diagnosis, and plan of care documented in the resident's note.  

## 2021-08-30 ENCOUNTER — Encounter: Payer: Self-pay | Admitting: Student

## 2021-08-30 ENCOUNTER — Other Ambulatory Visit (HOSPITAL_COMMUNITY): Payer: Self-pay

## 2021-08-30 ENCOUNTER — Other Ambulatory Visit: Payer: Self-pay

## 2021-08-30 ENCOUNTER — Ambulatory Visit (INDEPENDENT_AMBULATORY_CARE_PROVIDER_SITE_OTHER): Payer: Self-pay | Admitting: Student

## 2021-08-30 VITALS — BP 133/89 | HR 88 | Temp 98.3°F | Resp 24 | Ht 76.0 in | Wt 223.1 lb

## 2021-08-30 DIAGNOSIS — F17210 Nicotine dependence, cigarettes, uncomplicated: Secondary | ICD-10-CM

## 2021-08-30 DIAGNOSIS — E782 Mixed hyperlipidemia: Secondary | ICD-10-CM

## 2021-08-30 DIAGNOSIS — E876 Hypokalemia: Secondary | ICD-10-CM

## 2021-08-30 DIAGNOSIS — I10 Essential (primary) hypertension: Secondary | ICD-10-CM

## 2021-08-30 DIAGNOSIS — I739 Peripheral vascular disease, unspecified: Secondary | ICD-10-CM

## 2021-08-30 MED ORDER — ROSUVASTATIN CALCIUM 20 MG PO TABS
20.0000 mg | ORAL_TABLET | Freq: Every day | ORAL | 3 refills | Status: AC
  Filled 2021-08-30: qty 30, 30d supply, fill #0

## 2021-08-30 NOTE — Patient Instructions (Signed)
Thank you, [name] for allowing [place] to provide your care today. Today we discussed your blood pressure, hypokalemia and cholesterol.  I am checking some blood work to make sure your potassium is normal.  Continue to take all your medications as prescribed.  I have ordered the following labs for you:   Lab Orders         BMP8+Anion Gap      I will call if any are abnormal. All of your labs can be accessed through "My Chart".  My Chart Access: [email or web address]  Please follow-up in 2 to 3 weeks for OUD follow-up  Please make sure to arrive 15 minutes prior to your next appointment. If you arrive late, you may be asked to reschedule.    We look forward to seeing you next time. Please call our clinic at [phone] if you have any questions or concerns. The best time to call is Monday-Friday from 9am-4pm, but there is someone available 24/7. If after hours or the weekend, call the main hospital number and ask for the Internal Medicine Resident On-Call. If you need medication refills, please notify your pharmacy one week in advance and they will send [place] a request.   Thank you for letting us take part in your care. Wishing you the best!  Lacinda Axon, MD 08/30/2021, 3:07 PM IM Resident, PGY-2 Mark [name] 41:10

## 2021-08-30 NOTE — Assessment & Plan Note (Signed)
Patient here for blood pressure follow-up. Blood pressure improved with SBP in the 130s on current blood pressure regimen. He denies any headaches, chest pain, shortness of blurry vision or dizziness. Patient found to have hypokalemia last week and now status post potassium repletion.  Here for repeat BMP.  Plan: -Continue amlodipine 10 mg daily -Continue all metoprolol 25 mg twice daily -Continue HCTZ 25 mg daily -Repeat BMP

## 2021-08-30 NOTE — Assessment & Plan Note (Signed)
Patient reports he has since Crestor 20 mg at home however he forgets to take them most of the time.  Patient advised to take his cholesterol medication daily to help decrease his risk of cardiovascular events. -Repeat lipid panel in 6 months -Refilled Crestor 20 mg daily -Continue ASA 81 mg daily

## 2021-08-31 LAB — BMP8+ANION GAP
Anion Gap: 19 mmol/L — ABNORMAL HIGH (ref 10.0–18.0)
BUN/Creatinine Ratio: 9 (ref 9–20)
BUN: 6 mg/dL (ref 6–24)
CO2: 26 mmol/L (ref 20–29)
Calcium: 9.4 mg/dL (ref 8.7–10.2)
Chloride: 92 mmol/L — ABNORMAL LOW (ref 96–106)
Creatinine, Ser: 0.68 mg/dL — ABNORMAL LOW (ref 0.76–1.27)
Glucose: 169 mg/dL — ABNORMAL HIGH (ref 70–99)
Potassium: 4.5 mmol/L (ref 3.5–5.2)
Sodium: 137 mmol/L (ref 134–144)
eGFR: 110 mL/min/{1.73_m2} (ref >59)

## 2021-08-31 NOTE — Progress Notes (Signed)
Internal Medicine Clinic Attending  Case discussed with Dr. [place]  At the time of the visit.  We reviewed the resident's history and exam and pertinent patient test results.  I agree with the assessment, diagnosis, and plan of care documented in the resident's note.  

## 2021-09-01 ENCOUNTER — Encounter: Payer: Self-pay | Admitting: *Deleted

## 2021-09-05 ENCOUNTER — Other Ambulatory Visit (HOSPITAL_COMMUNITY): Payer: Self-pay

## 2021-09-06 ENCOUNTER — Other Ambulatory Visit (HOSPITAL_COMMUNITY): Payer: Self-pay

## 2021-09-14 ENCOUNTER — Other Ambulatory Visit (HOSPITAL_COMMUNITY): Payer: Self-pay

## 2021-09-14 ENCOUNTER — Other Ambulatory Visit: Payer: Self-pay | Admitting: Internal Medicine

## 2021-09-17 ENCOUNTER — Other Ambulatory Visit: Payer: Self-pay

## 2021-09-17 DIAGNOSIS — F1120 Opioid dependence, uncomplicated: Secondary | ICD-10-CM

## 2021-09-18 ENCOUNTER — Other Ambulatory Visit (HOSPITAL_COMMUNITY): Payer: Self-pay

## 2021-09-18 ENCOUNTER — Other Ambulatory Visit: Payer: Self-pay | Admitting: Student

## 2021-09-18 DIAGNOSIS — F1120 Opioid dependence, uncomplicated: Secondary | ICD-10-CM

## 2021-09-18 NOTE — Telephone Encounter (Signed)
Last [facility] 05/27/2021.  Last appointment 08/30/2021. Next Appointment  09/20/2021.

## 2021-09-19 ENCOUNTER — Other Ambulatory Visit (HOSPITAL_COMMUNITY): Payer: Self-pay

## 2021-09-19 MED ORDER — BUPRENORPHINE HCL-NALOXONE HCL 8-2 MG SL SUBL
1.0000 | SUBLINGUAL_TABLET | Freq: Three times a day (TID) | SUBLINGUAL | 1 refills | Status: AC
  Filled 2021-09-19: qty 42, 14d supply, fill #0

## 2021-09-19 MED ORDER — HYDROCHLOROTHIAZIDE 25 MG PO TABS
25.0000 mg | ORAL_TABLET | Freq: Every day | ORAL | 2 refills | Status: AC
  Filled 2021-09-19: qty 90, 90d supply, fill #0

## 2021-09-19 NOTE — Telephone Encounter (Signed)
Duplicate request

## 2021-09-20 ENCOUNTER — Other Ambulatory Visit: Payer: Self-pay

## 2021-09-20 ENCOUNTER — Ambulatory Visit (INDEPENDENT_AMBULATORY_CARE_PROVIDER_SITE_OTHER): Payer: Self-pay

## 2021-09-20 VITALS — BP 129/78 | HR 77 | Temp 97.5°F | Ht 76.0 in | Wt 219.1 lb

## 2021-09-20 DIAGNOSIS — F1120 Opioid dependence, uncomplicated: Secondary | ICD-10-CM

## 2021-09-20 NOTE — Progress Notes (Signed)
    09/20/2021  Mark [name] presents for follow up of opioid use disorder I have reviewed the prior induction visit, follow up visits, and telephone encounters relevant to opiate use disorder (OUD) treatment.   Current daily dose: Suboxone 8-2 TID   Current follow up interval, in weeks: 4 weeks  The patient has been adherent with the buprenorphine for OUD contract.   Last UDS Result: Expected  HPI: Patient here for opioid use follow-up. He denies any cravings or withdrawal symptoms on Suboxone 8-2 3 times daily. He has been consistently picking up his prescriptions every 2 weeks. He denies any other illicit drug use or use of non-prescribed medications. Recent UDS has been as expected. PDPM reviewed and consistent with prescribed suboxone.  Exam:   General: Pleasant, well-appearing middle-age man. No acute distress. Cardiac: RRR. No murmurs, rubs or gallops. No LE edema Respiratory: Lungs CTAB. No wheezing or crackles. Abdominal: Soft, symmetric and non tender. Normal BS. Skin: Warm, dry and intact without rashes or lesions Extremities: Atraumatic. Full ROM. Palpable radial and DP pulses. Neuro: A&O x 3. Moves all extremities. Normal sensory to gross touch. Psych: Appropriate mood and affect.  Vitals:   09/20/21 1529 09/20/21 1533  BP: (!) 134/92 129/78  Pulse: 81 77  Temp: (!) 97.5 F (36.4 C)   TempSrc: Oral   SpO2: 100%   Weight: 219 lb 1.6 oz (99.4 kg)   Height: '6\' 4"'$  (1.93 m)       Assessment/Plan:  See Problem Based Charting in the Encounters Tab  Patient presents for opioid use disorder follow-up.  He states that he is doing well on his Suboxone 8-2 3 times daily.  He has had no cravings or relapses, no illicit drug use, and is stable on his current dose. - Continue current regimen of Suboxone 8-2 mg tabs 3 times daily - Follow-up in 4 weeks - UDS next visit

## 2021-09-20 NOTE — Patient Instructions (Signed)
Thank you, [name] for allowing [place] to provide your care today. Today we discussed :  Suboxone- You are on a stable regimen and doing well. We will plan to see you back in 4 weeks and continue doing 2 week supplies with 1 refill.      Follow up:  4 weeks      We look forward to seeing you next time. Please call our clinic at [phone] if you have any questions or concerns. The best time to call is Monday-Friday from 9am-4pm, but there is someone available 24/7. If after hours or the weekend, call the main hospital number and ask for the Internal Medicine Resident On-Call. If you need medication refills, please notify your pharmacy one week in advance and they will send [place] a request.   Thank you for trusting me with your care. Wishing you the best! Iona Coach, MD [name]

## 2021-09-20 NOTE — Assessment & Plan Note (Signed)
Patient presents for opioid use disorder follow-up.  He states that he is doing well on his Suboxone 8-2 3 times daily.  He has had no cravings or relapses, no illicit drug use, and is stable on his current dose. - Continue current regimen of Suboxone 8-2 mg tabs 3 times daily - Follow-up in 4 weeks - UDS next visit

## 2021-09-21 ENCOUNTER — Emergency Department (HOSPITAL_COMMUNITY): Payer: No Typology Code available for payment source

## 2021-09-21 ENCOUNTER — Emergency Department (HOSPITAL_COMMUNITY)
Admission: EM | Admit: 2021-09-21 | Discharge: 2021-10-09 | Disposition: E | Payer: No Typology Code available for payment source | Attending: Emergency Medicine | Admitting: Emergency Medicine

## 2021-09-21 DIAGNOSIS — Y92410 Unspecified street and highway as the place of occurrence of the external cause: Secondary | ICD-10-CM | POA: Diagnosis not present

## 2021-09-21 DIAGNOSIS — T1490XA Injury, unspecified, initial encounter: Secondary | ICD-10-CM | POA: Diagnosis present

## 2021-09-21 DIAGNOSIS — I468 Cardiac arrest due to other underlying condition: Secondary | ICD-10-CM | POA: Insufficient documentation

## 2021-09-21 DIAGNOSIS — I469 Cardiac arrest, cause unspecified: Secondary | ICD-10-CM

## 2021-09-21 LAB — CBC
HCT: 51.6 % (ref 39.0–52.0)
Hemoglobin: 17.3 g/dL — ABNORMAL HIGH (ref 13.0–17.0)
MCH: 28.7 pg (ref 26.0–34.0)
MCHC: 33.5 g/dL (ref 30.0–36.0)
MCV: 85.7 fL (ref 80.0–100.0)
Platelets: 285 10*3/uL (ref 150–400)
RBC: 6.02 MIL/uL — ABNORMAL HIGH (ref 4.22–5.81)
RDW: 12.9 % (ref 11.5–15.5)
WBC: 7.4 10*3/uL (ref 4.0–10.5)
nRBC: 0 % (ref 0.0–0.2)

## 2021-09-21 LAB — COMPREHENSIVE METABOLIC PANEL
ALT: 19 U/L (ref 0–44)
AST: 42 U/L — ABNORMAL HIGH (ref 15–41)
Albumin: 3.1 g/dL — ABNORMAL LOW (ref 3.5–5.0)
Alkaline Phosphatase: 107 U/L (ref 38–126)
Anion gap: 14 (ref 5–15)
BUN: <5 mg/dL — ABNORMAL LOW (ref 6–20)
CO2: 26 mmol/L (ref 22–32)
Calcium: 8.7 mg/dL — ABNORMAL LOW (ref 8.9–10.3)
Chloride: 96 mmol/L — ABNORMAL LOW (ref 98–111)
Creatinine, Ser: 0.72 mg/dL (ref 0.61–1.24)
GFR, Estimated: >60 mL/min (ref >60)
Glucose, Bld: 190 mg/dL — ABNORMAL HIGH (ref 70–99)
Potassium: 3.8 mmol/L (ref 3.5–5.1)
Sodium: 136 mmol/L (ref 135–145)
Total Bilirubin: 1.1 mg/dL (ref 0.3–1.2)
Total Protein: 5.2 g/dL — ABNORMAL LOW (ref 6.5–8.1)

## 2021-09-21 LAB — LACTIC ACID, PLASMA: Lactic Acid, Venous: 6 mmol/L (ref 0.5–1.9)

## 2021-09-21 MED ORDER — EPINEPHRINE 1 MG/10ML IJ SOSY
PREFILLED_SYRINGE | INTRAMUSCULAR | Status: AC | PRN
  Administered 2021-09-21 (×6): 1 mg via INTRAVENOUS

## 2021-09-21 MED ORDER — SODIUM CHLORIDE 0.9 % IV SOLN
INTRAVENOUS | Status: AC | PRN
  Administered 2021-09-21: 1000 mL via INTRAVENOUS

## 2021-09-22 LAB — TYPE AND SCREEN
ABO/RH(D): B POS
Antibody Screen: NEGATIVE
Unit division: 0
Unit division: 0

## 2021-09-22 LAB — PREPARE FRESH FROZEN PLASMA: Unit division: 0

## 2021-09-22 LAB — BPAM RBC
Blood Product Expiration Date: 202308012359
Blood Product Expiration Date: 202308012359
ISSUE DATE / TIME: 202307140856
ISSUE DATE / TIME: 202307140856
Unit Type and Rh: 5100
Unit Type and Rh: 5100

## 2021-09-22 LAB — BPAM FFP
Blood Product Expiration Date: 202307172359
Blood Product Expiration Date: 202307172359
ISSUE DATE / TIME: 202307140858
ISSUE DATE / TIME: 202307140858
Unit Type and Rh: 6200
Unit Type and Rh: 6200

## 2021-09-22 LAB — I-STAT CHEM 8, ED
BUN: <5 mg/dL — ABNORMAL LOW (ref 6–20)
Calcium, Ion: 0.86 mmol/L — CL (ref 1.15–1.40)
Chloride: 96 mmol/L — ABNORMAL LOW (ref 98–111)
Creatinine, Ser: 0.5 mg/dL — ABNORMAL LOW (ref 0.61–1.24)
Glucose, Bld: 193 mg/dL — ABNORMAL HIGH (ref 70–99)
HCT: 51 % (ref 39.0–52.0)
Hemoglobin: 17.3 g/dL — ABNORMAL HIGH (ref 13.0–17.0)
Potassium: 7.8 mmol/L (ref 3.5–5.1)
Sodium: 129 mmol/L — ABNORMAL LOW (ref 135–145)
TCO2: 26 mmol/L (ref 22–32)

## 2021-09-26 LAB — TOXASSURE SELECT,+ANTIDEPR,UR

## 2021-10-02 NOTE — Progress Notes (Signed)
Internal Medicine Clinic Attending  Case discussed with Dr. Rogers  at the time of the visit.  We reviewed the resident's history and exam and pertinent patient test results.  I agree with the assessment, diagnosis, and plan of care documented in the resident's note.  

## 2021-10-09 NOTE — ED Notes (Signed)
Pulse check asystole
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
## 2021-10-09 NOTE — ED Notes (Signed)
Left chest decompressed by Dr Bobbye Morton
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
## 2021-10-09 NOTE — ED Notes (Signed)
Me notified

## 2021-10-09 NOTE — ED Notes (Signed)
Right chest decompressed by Dr Bobbye Morton

## 2021-10-09 NOTE — ED Notes (Signed)
Family notified of patients arrival, Daughter en route

## 2021-10-09 NOTE — ED Notes (Signed)
Left groin cortis placed by Dr Bobbye Morton

## 2021-10-09 NOTE — ED Notes (Signed)
Organ procurement team notified.
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
## 2021-10-09 NOTE — Progress Notes (Signed)
Orthopedic Tech Progress Note Patient Details:  Mark [name] 1965/11/05 [id number]  Patient ID: [name], male   DOB: 1965/11/21, 56 y.o.   MRN: [id number] Level I, not needed.  Vernona Rieger 10-05-21, 9:21 AM

## 2021-10-09 NOTE — ED Notes (Signed)
Fast US done no cardiac activity per Dr Bobbye Morton

## 2021-10-09 NOTE — ED Notes (Signed)
Pulse Check-Asystole

## 2021-10-09 NOTE — Progress Notes (Addendum)
Responded to page to support pt and staff.  Pt came in as CPR and later Passed. Nurse called Daughter. Daughter said she had been trying to reach her father. Provided emotional  and spiritual support to Daughter and staff.   Jaclynn Major, [place], [facility], Pager [phone]
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
## 2021-10-09 NOTE — ED Notes (Signed)
[place] used for blood administration
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
## 2021-10-09 DEATH — deceased

## 2021-10-22 ENCOUNTER — Encounter: Payer: Self-pay | Admitting: Student
